# Patient Record
Sex: Male | Born: 1940
Health system: Southern US, Community
[De-identification: ages and names within clinical notes are randomized; demographics above are authoritative.]

## PROBLEM LIST (undated history)

## (undated) DIAGNOSIS — I2510 Atherosclerotic heart disease of native coronary artery without angina pectoris: Secondary | ICD-10-CM

## (undated) DIAGNOSIS — J449 Chronic obstructive pulmonary disease, unspecified: Secondary | ICD-10-CM

## (undated) DIAGNOSIS — F1020 Alcohol dependence, uncomplicated: Secondary | ICD-10-CM

## (undated) DIAGNOSIS — C801 Malignant (primary) neoplasm, unspecified: Secondary | ICD-10-CM

## (undated) DIAGNOSIS — IMO0001 Reserved for inherently not codable concepts without codable children: Secondary | ICD-10-CM

## (undated) DIAGNOSIS — R0600 Dyspnea, unspecified: Secondary | ICD-10-CM

## (undated) DIAGNOSIS — D329 Benign neoplasm of meninges, unspecified: Secondary | ICD-10-CM

## (undated) DIAGNOSIS — K219 Gastro-esophageal reflux disease without esophagitis: Secondary | ICD-10-CM

## (undated) DIAGNOSIS — R195 Other fecal abnormalities: Secondary | ICD-10-CM

## (undated) DIAGNOSIS — E785 Hyperlipidemia, unspecified: Secondary | ICD-10-CM

## (undated) DIAGNOSIS — E119 Type 2 diabetes mellitus without complications: Secondary | ICD-10-CM

## (undated) DIAGNOSIS — M1990 Unspecified osteoarthritis, unspecified site: Secondary | ICD-10-CM

## (undated) DIAGNOSIS — Z720 Tobacco use: Secondary | ICD-10-CM

## (undated) DIAGNOSIS — Z8719 Personal history of other diseases of the digestive system: Secondary | ICD-10-CM

## (undated) DIAGNOSIS — I739 Peripheral vascular disease, unspecified: Secondary | ICD-10-CM

## (undated) DIAGNOSIS — IMO0002 Reserved for concepts with insufficient information to code with codable children: Secondary | ICD-10-CM

## (undated) DIAGNOSIS — Z87442 Personal history of urinary calculi: Secondary | ICD-10-CM

## (undated) DIAGNOSIS — F419 Anxiety disorder, unspecified: Secondary | ICD-10-CM

## (undated) DIAGNOSIS — I499 Cardiac arrhythmia, unspecified: Secondary | ICD-10-CM

## (undated) DIAGNOSIS — J189 Pneumonia, unspecified organism: Secondary | ICD-10-CM

## (undated) HISTORY — DX: Other fecal abnormalities: R19.5

## (undated) HISTORY — PX: HERNIA REPAIR: SHX51

## (undated) HISTORY — DX: Tobacco use: Z72.0

## (undated) HISTORY — PX: TONSILLECTOMY: SUR1361

## (undated) HISTORY — DX: Reserved for inherently not codable concepts without codable children: IMO0001

## (undated) HISTORY — DX: Benign neoplasm of meninges, unspecified: D32.9

## (undated) HISTORY — PX: TONSILLECTOMY: SHX5217

## (undated) HISTORY — DX: Hyperlipidemia, unspecified: E78.5

## (undated) HISTORY — PX: EYE SURGERY: SHX253

## (undated) HISTORY — DX: Reserved for concepts with insufficient information to code with codable children: IMO0002

## (undated) HISTORY — DX: Gastro-esophageal reflux disease without esophagitis: K21.9

## (undated) HISTORY — PX: COLONOSCOPY: SHX174

## (undated) HISTORY — DX: Type 2 diabetes mellitus without complications: E11.9

## (undated) HISTORY — DX: Alcohol dependence, uncomplicated: F10.20

---

## 2008-07-25 ENCOUNTER — Ambulatory Visit: Payer: Self-pay | Admitting: *Deleted

## 2008-07-25 DIAGNOSIS — D369 Benign neoplasm, unspecified site: Secondary | ICD-10-CM | POA: Insufficient documentation

## 2008-07-26 DIAGNOSIS — K219 Gastro-esophageal reflux disease without esophagitis: Secondary | ICD-10-CM | POA: Insufficient documentation

## 2008-08-11 ENCOUNTER — Emergency Department (HOSPITAL_BASED_OUTPATIENT_CLINIC_OR_DEPARTMENT_OTHER): Admission: EM | Admit: 2008-08-11 | Discharge: 2008-08-11 | Payer: Self-pay | Admitting: Emergency Medicine

## 2008-08-11 ENCOUNTER — Ambulatory Visit: Payer: Self-pay | Admitting: Internal Medicine

## 2008-08-11 DIAGNOSIS — R3129 Other microscopic hematuria: Secondary | ICD-10-CM | POA: Insufficient documentation

## 2008-08-11 LAB — CONVERTED CEMR LAB
Bilirubin Urine: NEGATIVE
Glucose, Urine, Semiquant: NEGATIVE
Ketones, urine, test strip: NEGATIVE
Nitrite: NEGATIVE
Protein, U semiquant: NEGATIVE
Specific Gravity, Urine: <1.005
Urobilinogen, UA: 0.2
WBC Urine, dipstick: NEGATIVE
pH: 8.5

## 2008-08-17 ENCOUNTER — Ambulatory Visit: Payer: Self-pay | Admitting: *Deleted

## 2008-11-24 ENCOUNTER — Ambulatory Visit: Payer: Self-pay | Admitting: *Deleted

## 2008-11-24 DIAGNOSIS — F17210 Nicotine dependence, cigarettes, uncomplicated: Secondary | ICD-10-CM | POA: Insufficient documentation

## 2008-12-26 ENCOUNTER — Ambulatory Visit: Payer: Self-pay | Admitting: Diagnostic Radiology

## 2008-12-26 ENCOUNTER — Ambulatory Visit: Payer: Self-pay | Admitting: *Deleted

## 2008-12-26 ENCOUNTER — Ambulatory Visit (HOSPITAL_BASED_OUTPATIENT_CLINIC_OR_DEPARTMENT_OTHER): Admission: RE | Admit: 2008-12-26 | Discharge: 2008-12-26 | Payer: Self-pay | Admitting: *Deleted

## 2008-12-26 LAB — CONVERTED CEMR LAB
ALT: 16 units/L (ref 0–53)
AST: 16 units/L (ref 0–37)
Albumin: 3.6 g/dL (ref 3.5–5.2)
Alkaline Phosphatase: 76 units/L (ref 39–117)
BUN: 14 mg/dL (ref 6–23)
Basophils Absolute: 0 10*3/uL (ref 0.0–0.1)
Basophils Relative: 0 % (ref 0.0–3.0)
CO2: 31 meq/L (ref 19–32)
Calcium: 9.2 mg/dL (ref 8.4–10.5)
Chloride: 102 meq/L (ref 96–112)
Cholesterol: 223 mg/dL (ref 0–200)
Creatinine, Ser: 0.9 mg/dL (ref 0.4–1.5)
Direct LDL: 142.2 mg/dL
Eosinophils Absolute: 0.1 10*3/uL (ref 0.0–0.7)
Eosinophils Relative: 1.7 % (ref 0.0–5.0)
GFR calc Af Amer: 108 mL/min
GFR calc non Af Amer: 89 mL/min
Glucose, Bld: 95 mg/dL (ref 70–99)
HCT: 43.9 % (ref 39.0–52.0)
Hemoglobin: 15.4 g/dL (ref 13.0–17.0)
Lymphocytes Relative: 30.7 % (ref 12.0–46.0)
MCHC: 35 g/dL (ref 30.0–36.0)
MCV: 92.3 fL (ref 78.0–100.0)
Monocytes Absolute: 0.7 10*3/uL (ref 0.1–1.0)
Monocytes Relative: 7.7 % (ref 3.0–12.0)
Neutro Abs: 5.2 10*3/uL (ref 1.4–7.7)
Neutrophils Relative %: 59.9 % (ref 43.0–77.0)
PSA: 2.02 ng/mL (ref 0.10–4.00)
Platelets: 254 10*3/uL (ref 150–400)
Potassium: 4 meq/L (ref 3.5–5.1)
RBC: 4.76 M/uL (ref 4.22–5.81)
RDW: 13 % (ref 11.5–14.6)
Sodium: 141 meq/L (ref 135–145)
TSH: 1.55 microintl units/mL (ref 0.35–5.50)
Total Bilirubin: 0.7 mg/dL (ref 0.3–1.2)
Total Protein: 6.6 g/dL (ref 6.0–8.3)
WBC: 8.6 10*3/uL (ref 4.5–10.5)

## 2009-01-03 ENCOUNTER — Ambulatory Visit: Payer: Self-pay | Admitting: Gastroenterology

## 2009-01-23 ENCOUNTER — Ambulatory Visit: Payer: Self-pay | Admitting: Gastroenterology

## 2009-11-17 ENCOUNTER — Ambulatory Visit: Payer: Self-pay | Admitting: Family

## 2009-11-17 DIAGNOSIS — D179 Benign lipomatous neoplasm, unspecified: Secondary | ICD-10-CM | POA: Insufficient documentation

## 2009-11-17 LAB — CONVERTED CEMR LAB
ALT: 12 units/L (ref 0–53)
AST: 12 units/L (ref 0–37)
Albumin: 4.3 g/dL (ref 3.5–5.2)
Alkaline Phosphatase: 84 units/L (ref 39–117)
BUN: 12 mg/dL (ref 6–23)
Basophils Absolute: 0 10*3/uL (ref 0.0–0.1)
Basophils Relative: 0 % (ref 0–1)
Bilirubin Urine: NEGATIVE
CO2: 22 meq/L (ref 19–32)
Calcium: 10 mg/dL (ref 8.4–10.5)
Chloride: 102 meq/L (ref 96–112)
Creatinine, Ser: 0.76 mg/dL (ref 0.40–1.50)
Eosinophils Absolute: 0.2 10*3/uL (ref 0.0–0.7)
Eosinophils Relative: 2 % (ref 0–5)
Glucose, Bld: 86 mg/dL (ref 70–99)
Glucose, Urine, Semiquant: NEGATIVE
HCT: 47.7 % (ref 39.0–52.0)
Hemoglobin: 15.9 g/dL (ref 13.0–17.0)
Ketones, urine, test strip: NEGATIVE
Lymphocytes Relative: 32 % (ref 12–46)
Lymphs Abs: 3.7 10*3/uL (ref 0.7–4.0)
MCHC: 33.3 g/dL (ref 30.0–36.0)
MCV: 93 fL (ref 78.0–100.0)
Monocytes Absolute: 0.8 10*3/uL (ref 0.1–1.0)
Monocytes Relative: 7 % (ref 3–12)
Neutro Abs: 6.6 10*3/uL (ref 1.7–7.7)
Neutrophils Relative %: 59 % (ref 43–77)
Nitrite: NEGATIVE
PSA: 1.88 ng/mL (ref 0.10–4.00)
Platelets: 291 10*3/uL (ref 150–400)
Potassium: 4.6 meq/L (ref 3.5–5.3)
Protein, U semiquant: NEGATIVE
RBC: 5.13 M/uL (ref 4.22–5.81)
RDW: 13.4 % (ref 11.5–15.5)
Sodium: 140 meq/L (ref 135–145)
Specific Gravity, Urine: 1.01
Total Bilirubin: 0.5 mg/dL (ref 0.3–1.2)
Total Protein: 7 g/dL (ref 6.0–8.3)
Urobilinogen, UA: 0.2
WBC Urine, dipstick: NEGATIVE
WBC: 11.4 10*3/uL — ABNORMAL HIGH (ref 4.0–10.5)
pH: 6

## 2009-11-19 ENCOUNTER — Encounter: Payer: Self-pay | Admitting: Family

## 2009-11-19 ENCOUNTER — Telehealth (INDEPENDENT_AMBULATORY_CARE_PROVIDER_SITE_OTHER): Payer: Self-pay | Admitting: *Deleted

## 2009-11-20 ENCOUNTER — Encounter (INDEPENDENT_AMBULATORY_CARE_PROVIDER_SITE_OTHER): Payer: Self-pay | Admitting: *Deleted

## 2009-11-22 ENCOUNTER — Encounter (INDEPENDENT_AMBULATORY_CARE_PROVIDER_SITE_OTHER): Payer: Self-pay | Admitting: *Deleted

## 2009-11-24 ENCOUNTER — Ambulatory Visit: Payer: Self-pay | Admitting: Internal Medicine

## 2009-11-24 ENCOUNTER — Encounter: Payer: Self-pay | Admitting: Family

## 2009-11-24 ENCOUNTER — Ambulatory Visit: Payer: Self-pay | Admitting: Gastroenterology

## 2009-11-24 LAB — CONVERTED CEMR LAB
Cholesterol: 203 mg/dL — ABNORMAL HIGH (ref 0–200)
HDL: 45 mg/dL (ref >39)
LDL Cholesterol: 123 mg/dL — ABNORMAL HIGH (ref 0–99)
Total CHOL/HDL Ratio: 4.5
Triglycerides: 174 mg/dL — ABNORMAL HIGH (ref <150)
VLDL: 35 mg/dL (ref 0–40)

## 2009-11-26 ENCOUNTER — Encounter: Payer: Self-pay | Admitting: Family

## 2009-11-26 ENCOUNTER — Telehealth (INDEPENDENT_AMBULATORY_CARE_PROVIDER_SITE_OTHER): Payer: Self-pay | Admitting: *Deleted

## 2009-11-30 ENCOUNTER — Encounter: Payer: Self-pay | Admitting: Family

## 2009-12-08 ENCOUNTER — Ambulatory Visit: Payer: Self-pay | Admitting: Gastroenterology

## 2009-12-08 LAB — HM COLONOSCOPY

## 2009-12-12 ENCOUNTER — Encounter: Payer: Self-pay | Admitting: Gastroenterology

## 2009-12-12 ENCOUNTER — Telehealth (INDEPENDENT_AMBULATORY_CARE_PROVIDER_SITE_OTHER): Payer: Self-pay | Admitting: *Deleted

## 2009-12-18 ENCOUNTER — Encounter: Payer: Self-pay | Admitting: Family

## 2010-01-08 ENCOUNTER — Telehealth: Payer: Self-pay | Admitting: Family

## 2010-01-17 ENCOUNTER — Emergency Department (HOSPITAL_BASED_OUTPATIENT_CLINIC_OR_DEPARTMENT_OTHER): Admission: EM | Admit: 2010-01-17 | Discharge: 2010-01-17 | Payer: Self-pay | Admitting: Emergency Medicine

## 2010-01-17 ENCOUNTER — Ambulatory Visit: Payer: Self-pay | Admitting: Diagnostic Radiology

## 2010-01-17 ENCOUNTER — Encounter: Payer: Self-pay | Admitting: Family

## 2010-01-29 ENCOUNTER — Ambulatory Visit: Payer: Self-pay | Admitting: Family

## 2010-01-29 DIAGNOSIS — I714 Abdominal aortic aneurysm, without rupture, unspecified: Secondary | ICD-10-CM | POA: Insufficient documentation

## 2010-01-31 ENCOUNTER — Ambulatory Visit (HOSPITAL_BASED_OUTPATIENT_CLINIC_OR_DEPARTMENT_OTHER): Admission: RE | Admit: 2010-01-31 | Discharge: 2010-01-31 | Payer: Self-pay | Admitting: Internal Medicine

## 2010-01-31 ENCOUNTER — Ambulatory Visit: Payer: Self-pay | Admitting: Interventional Radiology

## 2010-02-01 ENCOUNTER — Telehealth: Payer: Self-pay | Admitting: Internal Medicine

## 2010-02-02 ENCOUNTER — Telehealth: Payer: Self-pay | Admitting: Family

## 2010-02-05 ENCOUNTER — Ambulatory Visit: Payer: Self-pay | Admitting: Family

## 2010-02-05 DIAGNOSIS — N281 Cyst of kidney, acquired: Secondary | ICD-10-CM | POA: Insufficient documentation

## 2010-02-05 DIAGNOSIS — I700 Atherosclerosis of aorta: Secondary | ICD-10-CM | POA: Insufficient documentation

## 2010-02-12 ENCOUNTER — Telehealth: Payer: Self-pay | Admitting: Family

## 2010-03-26 ENCOUNTER — Telehealth: Payer: Self-pay | Admitting: Family

## 2010-04-06 ENCOUNTER — Ambulatory Visit: Payer: Self-pay | Admitting: Family

## 2010-05-14 ENCOUNTER — Telehealth: Payer: Self-pay | Admitting: Family

## 2010-05-14 ENCOUNTER — Ambulatory Visit: Payer: Self-pay | Admitting: Family

## 2010-05-14 DIAGNOSIS — E785 Hyperlipidemia, unspecified: Secondary | ICD-10-CM | POA: Insufficient documentation

## 2010-05-14 DIAGNOSIS — D72829 Elevated white blood cell count, unspecified: Secondary | ICD-10-CM | POA: Insufficient documentation

## 2010-05-14 LAB — CONVERTED CEMR LAB
ALT: 9 units/L (ref 0–53)
AST: 10 units/L (ref 0–37)
Albumin: 4.3 g/dL (ref 3.5–5.2)
Alkaline Phosphatase: 79 units/L (ref 39–117)
Basophils Absolute: 0 10*3/uL (ref 0.0–0.1)
Basophils Relative: 0 % (ref 0–1)
Bilirubin Urine: NEGATIVE
Bilirubin, Direct: 0.1 mg/dL (ref 0.0–0.3)
Cholesterol: 209 mg/dL — ABNORMAL HIGH (ref 0–200)
Eosinophils Absolute: 0.1 10*3/uL (ref 0.0–0.7)
Eosinophils Relative: 1 % (ref 0–5)
Glucose, Urine, Semiquant: NEGATIVE
HCT: 48.1 % (ref 39.0–52.0)
HDL: 45 mg/dL (ref >39)
Hemoglobin: 15.6 g/dL (ref 13.0–17.0)
Indirect Bilirubin: 0.3 mg/dL (ref 0.0–0.9)
Ketones, urine, test strip: NEGATIVE
LDL Cholesterol: 110 mg/dL — ABNORMAL HIGH (ref 0–99)
Lymphocytes Relative: 29 % (ref 12–46)
Lymphs Abs: 2.9 10*3/uL (ref 0.7–4.0)
MCHC: 32.4 g/dL (ref 30.0–36.0)
MCV: 94.5 fL (ref 78.0–100.0)
Monocytes Absolute: 0.7 10*3/uL (ref 0.1–1.0)
Monocytes Relative: 7 % (ref 3–12)
Neutro Abs: 6.2 10*3/uL (ref 1.7–7.7)
Neutrophils Relative %: 62 % (ref 43–77)
Nitrite: NEGATIVE
Platelets: 251 10*3/uL (ref 150–400)
RBC: 5.09 M/uL (ref 4.22–5.81)
RDW: 14 % (ref 11.5–15.5)
Specific Gravity, Urine: 1.015
Total Bilirubin: 0.4 mg/dL (ref 0.3–1.2)
Total CHOL/HDL Ratio: 4.6
Total Protein: 6.9 g/dL (ref 6.0–8.3)
Triglycerides: 271 mg/dL — ABNORMAL HIGH (ref <150)
Urobilinogen, UA: 0.2
VLDL: 54 mg/dL — ABNORMAL HIGH (ref 0–40)
WBC Urine, dipstick: NEGATIVE
WBC: 10 10*3/uL (ref 4.0–10.5)
pH: 6

## 2010-05-15 ENCOUNTER — Telehealth: Payer: Self-pay | Admitting: Family

## 2010-05-18 ENCOUNTER — Telehealth: Payer: Self-pay | Admitting: Family

## 2010-05-21 ENCOUNTER — Telehealth: Payer: Self-pay | Admitting: Family

## 2010-05-21 ENCOUNTER — Ambulatory Visit: Payer: Self-pay | Admitting: Family

## 2010-05-21 DIAGNOSIS — H9190 Unspecified hearing loss, unspecified ear: Secondary | ICD-10-CM | POA: Insufficient documentation

## 2010-05-28 ENCOUNTER — Encounter: Payer: Self-pay | Admitting: Family

## 2010-06-05 ENCOUNTER — Telehealth: Payer: Self-pay | Admitting: Family

## 2010-06-06 ENCOUNTER — Telehealth: Payer: Self-pay | Admitting: Family

## 2010-06-07 ENCOUNTER — Encounter: Payer: Self-pay | Admitting: Family

## 2010-06-08 ENCOUNTER — Ambulatory Visit: Payer: Self-pay | Admitting: Family

## 2010-06-08 LAB — CONVERTED CEMR LAB
Bilirubin Urine: NEGATIVE
Glucose, Urine, Semiquant: NEGATIVE
Ketones, urine, test strip: NEGATIVE
Nitrite: NEGATIVE
Protein, U semiquant: NEGATIVE
Specific Gravity, Urine: 1.01
Urobilinogen, UA: 0.2
pH: 5

## 2010-06-20 ENCOUNTER — Telehealth: Payer: Self-pay | Admitting: Family

## 2010-07-04 ENCOUNTER — Telehealth (INDEPENDENT_AMBULATORY_CARE_PROVIDER_SITE_OTHER): Payer: Self-pay | Admitting: *Deleted

## 2010-07-12 ENCOUNTER — Telehealth: Payer: Self-pay | Admitting: Family

## 2010-07-30 ENCOUNTER — Ambulatory Visit: Payer: Self-pay | Admitting: Family

## 2010-07-30 LAB — CONVERTED CEMR LAB
Cholesterol: 150 mg/dL (ref 0–200)
HDL: 46 mg/dL (ref >39)
LDL Cholesterol: 78 mg/dL (ref 0–99)
Total CHOL/HDL Ratio: 3.3
Triglycerides: 128 mg/dL (ref <150)
VLDL: 26 mg/dL (ref 0–40)

## 2010-10-05 ENCOUNTER — Telehealth: Payer: Self-pay | Admitting: Family

## 2010-10-09 ENCOUNTER — Telehealth: Payer: Self-pay | Admitting: Family

## 2010-10-09 ENCOUNTER — Ambulatory Visit (HOSPITAL_BASED_OUTPATIENT_CLINIC_OR_DEPARTMENT_OTHER)
Admission: RE | Admit: 2010-10-09 | Discharge: 2010-10-09 | Payer: Self-pay | Source: Home / Self Care | Admitting: Internal Medicine

## 2010-10-09 ENCOUNTER — Ambulatory Visit: Payer: Self-pay | Admitting: Family

## 2010-10-09 DIAGNOSIS — R609 Edema, unspecified: Secondary | ICD-10-CM | POA: Insufficient documentation

## 2010-10-09 DIAGNOSIS — M5430 Sciatica, unspecified side: Secondary | ICD-10-CM | POA: Insufficient documentation

## 2010-10-09 DIAGNOSIS — L851 Acquired keratosis [keratoderma] palmaris et plantaris: Secondary | ICD-10-CM | POA: Insufficient documentation

## 2010-10-10 ENCOUNTER — Telehealth: Payer: Self-pay | Admitting: Family

## 2010-10-31 ENCOUNTER — Ambulatory Visit
Admission: RE | Admit: 2010-10-31 | Discharge: 2010-10-31 | Payer: Self-pay | Source: Home / Self Care | Attending: Family | Admitting: Family

## 2010-12-04 NOTE — Progress Notes (Signed)
Summary: lab result, tricor  Phone Note Call from Patient Call back at Home Phone [phone]   Reason for Call: Talk to Nurse Summary of Call: Pt called to get lab results, pls call Initial call taken by: Lannette Donath,  May 18, 2010 4:57 PM  Follow-up for Phone Call        I would like to add another med for his triglycerides called tricor. Pt should f/u in 2 months. Follow-up by: Lemont Fillers FNP,  May 21, 2010 10:27 AM  Additional Follow-up for Phone Call Additional follow up Details #1::        Lab letter given to pt. Pt notified that Tricor has been sent to his pharmacy.  Nicki Guadalajara Fergerson CMA (AAMA)  May 21, 2010 3:04 PM     New/Updated Medications: TRICOR 145 MG TABS (FENOFIBRATE) one tablet by mouth daily Prescriptions: TRICOR 145 MG TABS (FENOFIBRATE) one tablet by mouth daily  #30 x 1   Entered and Authorized by:   Lemont Fillers FNP   Signed by:   Lemont Fillers FNP on 05/21/2010   Method used:   Electronically to        [facility] [phone]* (retail)       [address]       [place], [place]  [address]       Ph: [phone]       Fax: [phone]   RxID:   [id number]

## 2010-12-04 NOTE — Progress Notes (Signed)
Summary: simvastatin refill  Phone Note Call from Patient Call back at Home Phone [phone]   Caller: Patient Call For: Robert Bates Reason for Call: Refill Medication Summary of Call: Pt needs refill for cholestrol meds, pls call him first though, he has a question Initial call taken by: Lannette Donath,  Mar 26, 2010 9:52 AM  Follow-up for Phone Call        Spoke to pt. he requests refill on Simvastatin. Advised pt. I could give him a 30 day supply and he will need to be seen before further refills will be given.  Pt voices understanding and states that he will call back to schedule appt.  Robert Bates CMA  Mar 26, 2010 11:51 AM     Prescriptions: SIMVASTATIN 40 MG TABS (SIMVASTATIN) one tablet by mouth daily  #30 x 0   Entered by:   Robert Bates CMA   Authorized by:   Robert Bates   Signed by:   Robert Bates CMA on 03/26/2010   Method used:   Electronically to        [facility] [phone]* (retail)       [address]       [place], [place]  [address]       Ph: [phone]       Fax: [phone]   RxID:   [id number]

## 2010-12-04 NOTE — Progress Notes (Signed)
  Phone Note Outgoing Call   Summary of Call: Could we please make sure that [name] is scheduled for a FLP 1 week before a 3 month follow up?  Thanks Initial call taken by: Lemont Fillers FNP,  November 26, 2009 10:21 PM  Follow-up for Phone Call        pt has lab in [facility] 12/04/09 and appt  02/16/10 with you .Kandice Hams  November 27, 2009 8:34 AM  Follow-up by: Kandice Hams,  November 27, 2009 8:34 AM

## 2010-12-04 NOTE — Progress Notes (Signed)
  Phone Note Outgoing Call   Call placed by: Lemont Fillers FNP,  October 09, 2010 5:00 PM Call placed to: Imaging Summary of Call: Spoke with Eber Jones in imaging- pt did not complete doppler.  Myriam Jacobson will call patient this evening to see if he can complete this evening.  Initial call taken by: Lemont Fillers FNP,  October 09, 2010 5:00 PM

## 2010-12-04 NOTE — Progress Notes (Signed)
Summary: Medication Change  Phone Note Outgoing Call   Summary of Call: Please call patient and let him know that instead of the fenofibrate, I would like for him to try fish oil to help with his cholesterol as below.  Initial call taken by: Lemont Fillers FNP,  June 20, 2010 9:07 AM  Follow-up for Phone Call        patient advised  per Sandford Craze instructions Follow-up by: Glendell Docker CMA,  June 20, 2010 4:46 PM    New/Updated Medications: FISH OIL CONCENTRATE 1000 MG CAPS (OMEGA-3 FATTY ACIDS) two caps by mouth two times a day

## 2010-12-04 NOTE — Letter (Signed)
   [place] [facility] [address] [place], [place]  [address]  [place] Phone: [phone]      November 19, 2009   [facility] [address] [place], [place] [address]  RE:  LAB RESULTS  Dear  Robert Bates,  The following is an interpretation of your most recent lab tests.  Please take note of any instructions provided or changes to medications that have resulted from your lab work.  PSA:  normal - no follow-up needed PSA: 1.88  ELECTROLYTES:  Good - no changes needed  KIDNEY FUNCTION TESTS:  Good - no changes needed  LIVER FUNCTION TESTS:  Good - no changes needed    CBC:  Fair - review at your next visit  Please call my office to make arrangements for further tests or an appointment.  Sincerely Yours,    Lemont Fillers FNP

## 2010-12-04 NOTE — Assessment & Plan Note (Signed)
Summary: 2 month fu/dt--Rm 4   Vital Signs:  Patient profile:   70 year old male Height:      69 inches Weight:      177 pounds BMI:     26.23 Temp:     97.6 degrees F oral Pulse rate:   66 / minute Resp:     16 per minute BP sitting:   124 / 84  (right arm) Cuff size:   regular  Vitals Entered By: Mervin Kung CMA Duncan Dull) (July 30, 2010 11:09 AM) CC: Rm 5  2 month f/u. Is Patient Diabetic? No Pain Assessment Patient in pain? no        Primary Care Provider:  Lemont Fillers FNP  CC:  Rm 5  2 month f/u.[name]  History of Present Illness: Robert Bates is a 70 year old male who presents today for follow up.     GERD- has been well controlled on PPI unless he forgets to take his medication.  Hyperlipidemia- tolerating statin without problems. He reports that he has been having trouble remembering to take his fish oil twice daily. He has been trying to work on eating healthier.  He continues to take aspirin on a daily basis.    Allergies: 1)  ! Iodine  Past History:  Past Medical History: Last updated: 12/26/2008 GERD Alcoholism - past problem per patient - but admits to still drinking - unclear how much tobacco abuse hematuria - refuses work up or referral - understands risks of morbidity/mortality - 11/2008, 12/2008    Past Surgical History: Last updated: 08/11/2008 Tonsillectomy   Review of Systems       denies shortness of breath.  Occasional GERD symptoms if he misses his "pill"  Physical Exam  General:  Well-developed,well-nourished,in no acute distress; alert,appropriate and cooperative throughout examination Lungs:  Normal respiratory effort, chest expands symmetrically. Lungs are clear to auscultation, no crackles or wheezes. Heart:  Normal rate and regular rhythm. S1 and S2 normal without gallop, murmur, click, rub or other extra sounds. Extremities:  No clubbing, cyanosis, edema, or deformity noted with normal full range of motion of all  joints.     Impression & Recommendations:  Problem # 1:  HYPERLIPIDEMIA (ICD-272.4) Assessment Unchanged Will check FLP today, continue statin, pt reminded to take fish oil two times a day. Patient was counseled on low cholesterol diet, exercise, and weight loss.  He was also counseled on need to quit smoking. His updated medication list for this problem includes:    Simvastatin 40 Mg Tabs (Simvastatin) ..... One tablet by mouth daily  Orders: TLB-Lipid Panel (80061-LIPID)  Problem # 2:  GERD (ICD-530.81) Assessment: Improved Continue PPI His updated medication list for this problem includes:    Omeprazole 40 Mg Cpdr (Omeprazole) ..... One cap by mouth once daily  Complete Medication List: 1)  Omeprazole 40 Mg Cpdr (Omeprazole) .... One cap by mouth once daily 2)  Aspirin Ec 325 Mg Tbec (Aspirin) .... One by mouth once daily 3)  Simvastatin 40 Mg Tabs (Simvastatin) .... One tablet by mouth daily 4)  Fish Oil Concentrate 1000 Mg Caps (Omega-3 fatty acids) .... Two caps by mouth two times a day  Other Orders: Tobacco use cessation intermediate 3-10 minutes ([address])  Patient Instructions: 1)  Please complete your labs downstairs. 2)  Follow up in 3 months, sooner if problems or concerns.  Current Allergies (reviewed today): ! IODINE

## 2010-12-04 NOTE — Assessment & Plan Note (Signed)
Summary: er follow up/mhf   Vital Signs:  Patient profile:   70 year old male Height:      69 inches Weight:      166.75 pounds BMI:     24.71 Temp:     97.4 degrees F oral Pulse rate:   76 / minute Pulse rhythm:   regular Resp:     16 per minute BP sitting:   102 / 80  (right arm) Cuff size:   regular  Vitals Entered By: Mervin Kung CMA (January 29, 2010 2:41 PM) CC: room  4   Follow up from ER on 01/17/10   Primary Care Provider:  Paulo Fruit MD  CC:  room  4   Follow up from ER on 01/17/10.  History of Present Illness: Robert Bates is a 70 year old male who presents today following a trip to the ER on 3/16 with bilateral lower abdominal pain.  A non-contrasted CT scan was performed (due to pt's iodine allergy)which noted some  soft tissue stranding arount the entire abdominal aora.  In addition it was noted that he ahad an AAA of 3.1 x 3.6 cm   Non-obstructive reanl calculi were noted.  Also notd was a 2.4 cm hyperdense lesion of the upper pole of the right kidney.  It was recommended by the radiology that this patient be pre-medicated and undergo a follow up ct with contrast.   Denies current abdominal pain.  Notes that he has been having chills but denies fever, cough or current nasal congestion. Notes + right ear discomfort with ringing in his ear.    Allergies: 1)  ! Iodine  Physical Exam  General:  Well-developed,well-nourished,in no acute distress; alert,appropriate and cooperative throughout examination Ears:  bilateral TM's occluded with cerumen, attempted disimpaction, but unable to complete due to discomfort. (right ear) Lungs:  Normal respiratory effort, chest expands symmetrically. Lungs are clear to auscultation, no crackles or wheezes. Heart:  Normal rate and regular rhythm. S1 and S2 normal without gallop, murmur, click, rub or other extra sounds.   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   Orders: Misc. Referral (Misc. Ref)  Problem # 2:  OTITIS MEDIA (ICD-382.9) Assessment: New Will plan empiric treatment with amoxicillin His updated medication list for this problem includes:    Amoxicillin 500 Mg Cap (Amoxicillin) .[name]... Take 1 capsule by mouth three times a day x 10 days  Complete Medication List: 1)  Omeprazole 40 Mg Cpdr (Omeprazole) .... One cap by mouth once daily 2)  Oxycodone-acetaminophen 5-325 Mg Tabs (Oxycodone-acetaminophen) .... Take 1 tablet every 4-6 hours as needed for pain 3)  Prednisone 50 Mg Tabs (Prednisone) .... Take as directed 4)  Amoxicillin 500 Mg Cap (Amoxicillin) .... Take 1 capsule by mouth three times a day x 10 days  Patient Instructions: 1)  Take Prednisone 50mg  (one tablet by mouth) 13 hours prior to your CT scan, 7 hours prior to your CT scan, and again 1 hour prior to your CT scan.  Also take Benadryl 50mg  (2 tablets) 1 hour prior to your CT scan. 2)  You will be called about your referral for your CT scan.   3)  Go to ER if you develop abdominal pain,  or chest pain. 4)  Call if your develop fever  over 101. 5)  Follow up in 1 week Prescriptions: AMOXICILLIN 500 MG CAP (AMOXICILLIN) Take 1 capsule by mouth three times a day X 10 days  #30 x 0   Entered and Authorized by:   Lemont Fillers FNP   Signed by:   Lemont Fillers FNP on 01/29/2010   Method used:   Electronically to        [facility] [phone]* (retail)       [address]       [place], [place]  [address]       Ph: [phone]       Fax: [phone]   RxID:   [id number] PREDNISONE 50 MG TABS (PREDNISONE) take as directed  #3 x 0   Entered and Authorized by:   Lemont Fillers FNP   Signed by:   Lemont Fillers FNP on 01/29/2010   Method used:   Electronically to         [facility] [phone]* (retail)       [address]       [place], [place]  [address]       Ph: [phone]       Fax: [phone]   RxID:   [id number]   Current Allergies (reviewed today): ! IODINE  Prevention & Chronic Care Immunizations   Influenza vaccine: Fluvax 3+  (08/17/2008)    Tetanus booster: 07/25/2008: Tdap    Pneumococcal vaccine: Not documented    H. zoster vaccine: Not documented  Colorectal Screening   Hemoccult: Not documented    Colonoscopy: DONE  (12/08/2009)   Colonoscopy due: 12/2012  Other Screening   PSA: 1.88  (11/17/2009)   Smoking status: current  (08/11/2008)   Smoking cessation counseling: yes  (11/24/2008)  Lipids   Total Cholesterol: 203  (11/24/2009)   LDL: 123  (11/24/2009)   LDL Direct: 142.2  (12/26/2008)   HDL: 45  (11/24/2009)   Triglycerides: 174  (11/24/2009)

## 2010-12-04 NOTE — Therapy (Signed)
Summary: [facility]   Imported By: Lanelle Bal 06/15/2010 12:34:34  _____________________________________________________________________  External Attachment:    Type:   Image     Comment:   External Document

## 2010-12-04 NOTE — Progress Notes (Signed)
Summary: simvastatin refill  Phone Note Call from Patient Call back at Home Phone [phone]   Caller: Patient Call For: Lemont Fillers FNP Reason for Call: Refill Medication Summary of Call: Pt was checking to see if we were calling in any Rx, I advised pt to call [facility] sometime before 4, if no Rx are complete to [facility] Initial call taken by: Lannette Donath,  May 14, 2010 2:20 PM  Follow-up for Phone Call        Pt notified refill complete.  Nicki Guadalajara Fergerson CMA (AAMA)  May 14, 2010 2:25 PM     Prescriptions: SIMVASTATIN 40 MG TABS (SIMVASTATIN) one tablet by mouth daily  #30 x 3   Entered by:   Mervin Kung CMA (AAMA)   Authorized by:   Lemont Fillers FNP   Signed by:   Mervin Kung CMA (AAMA) on 05/14/2010   Method used:   Electronically to        [facility] [phone]* (retail)       [address]       [place], [place]  [address]       Ph: [phone]       Fax: [phone]   RxID:   [id number]

## 2010-12-04 NOTE — Consult Note (Signed)
Summary: [facility]  [facility]   Imported By: Lanelle Bal 12/12/2009 10:41:24  _____________________________________________________________________  External Attachment:    Type:   Image     Comment:   External Document

## 2010-12-04 NOTE — Progress Notes (Signed)
Summary: labs  Phone Note Outgoing Call   Summary of Call: Please arrange for Mr Patriarca to have a repeat CBC in two weeks.  Thanks Initial call taken by: Lemont Fillers FNP,  November 19, 2009 2:36 PM  Follow-up for Phone Call        spoke w/ patient appt scheduled to recheck labs........Marland KitchenDoristine Devoid  November 20, 2009 3:50 PM

## 2010-12-04 NOTE — Letter (Signed)
Summary: [facility]  [facility]   Imported By: Lanelle Bal 12/28/2009 10:38:16  _____________________________________________________________________  External Attachment:    Type:   Image     Comment:   External Document

## 2010-12-04 NOTE — Letter (Signed)
Summary: Corrected [employer] Form  [employer]   Imported By: Lanelle Bal 07/31/2010 10:25:26  _____________________________________________________________________  External Attachment:    Type:   Image     Comment:   External Document

## 2010-12-04 NOTE — Assessment & Plan Note (Signed)
Summary: HAS CYST LEFT SHOULDER-CH   Vital Signs:  Patient Profile:   70 Years Old Male Height:     69 inches Weight:      176 pounds BMI:     26.08 O2 treatment:    Room Air Temp:     98.1 degrees F oral Pulse rate:   69 / minute Pulse rhythm:   regular Resp:     18 per minute BP sitting:   122 / 90  (right arm) Cuff size:   regular  Vitals Entered By: Darra Lis RMA (July 25, 2008 3:10 PM)             Is Patient Diabetic? No     Visit Type:  acute visit. PCP:  Paulo Fruit MD  Chief Complaint:  Establish care with a new doctor.[name]  History of Present Illness: Patient has a cyst left shoulder. Patient says that his sister-in-law lanced his cyst yesterday with a razor blade.  Patient had gone to another medical office about the cyst and was referred to a surgeon to remove it.  He did not want to go to a surgeon, so his sister-in-law lanced it and expressed out all the "white cheesy" material.  No purulent drainage per patient.  He is here just to make sure nothing else needs to be done.  He has an appt with the surgeon on 08/03/08.  Patient notes that the razor blade used for the "surgery" was perviously used to cut electrical wires and do various "fix it" activities - it was not a new blade.  He states that he has not had a tetanus shot in "way too long to remember."  Advised patient that he should get a tetatus shot.  Patient also had recent dental surgery - several teeth extracted - and is on antibiotics for that reason.  He was placed on penicillin last Monday for the dental surgery.  He says he is also on ampicillin from the initial medical office that referred him to a surgeon to excise what sounds like a dermoid cyst.  He says he takes one of those a day in place of a penicillin - "So I can get both of them in."    No fever / chills, no purulent discharge, no significant pain in the area.  Patient appears intoxicated and there is faint smell of alcohol.  When  asked how much alcohol he has had to drink today, he states that he has not drank any alcohol today.  He states that the smell is from using rubbing alcohol on his hands and "I even put some on my face."  He states, "I am talking funny because I had the teeth taken out."  He laughs about the inquiry and ends the conversation by stating, "I used to have a problem with alcohol, but not any more."  It is very difficult to get a full history from the patient.  He says, "I don't really know much about that" or he side tracks the conversation when I ask about past medical history, family history, etc.  I get very little details from the patient.    Updated Prior Medication List: PENICILLIN V POTASSIUM 500 MG TABS (PENICILLIN V POTASSIUM)  AMPICILLIN 500 MG CAPS (AMPICILLIN)   Current Allergies (reviewed today): ! IODINE  Past Medical History:    GERD - past problem    Alcoholism - past problem per patient  Past Surgical History:    Tonsillectomy   Family History:  Leukemia    Family History of Stroke   Social History:    Patient is very evasive about social history    Married   Risk Factors:  Tobacco use:  current    Year started:  1959    Cigarettes:  Yes -- 1 pack(s) per day    Counseled to quit/cut down tobacco use:  yes Passive smoke exposure:  yes Drug use:  no HIV high-risk behavior:  no Caffeine use:  5+ drinks per day Alcohol use:  yes    Type:  beer, wine - unclear how much he drinks    Counseled to quit/cut down alcohol use:  yes Exercise:  no Seatbelt use:  100 % Sun Exposure:  frequently  Family History Risk Factors:    Family History of MI in females < 5 years old:  no    Family History of MI in males < 68 years old:  no   Review of Systems       No nausea / vomiting / diarrhea / fever / chills / chest pain / SOB  - unable to get a full ROS - patient not able to stay on task   Physical Exam  General:     Well-developed, well nourished, well  hydrated, in no acute distress; see HPI Head:     Normocephalic and atraumatic without obvious abnormalities.  Eyes:     No corneal or conjunctival inflammation.  Vision grossly normal. Ears:     External ear shows no significant lesions or deformities.  Hearing is grossly normal. Nose:     External nasal examination shows no deformity or inflammation.  Neck:     supple, full ROM   Chest Wall:     No deformities, masses, or tenderness noted. Lungs:     Normal respiratory effort, chest expands symmetrically with good air flow noted. Lungs clear to auscultation with no crackles, rales or wheezes.   Heart:     Normal rate and  rhythm. S1 and S2 normal, without gallop, murmur, click, rub  Abdomen:     Bowel sounds positive, abdomen soft and non-tender without masses, organomegaly or hernias noted. no distention  Msk:     No gross deformity noted of cervical, thoracic or lumbar spine. normal ROM. no muscle atrophy noted.   Extremities:     No clubbing, cyanosis, edema, or deformity noted, grossly normal full range of motion with all joints.   Neurologic:     alert & oriented X3,  gait normal.   neuro is grossly intact Skin:     without suspicious lesions or rashes.  the area of the left upper chest near the clavicle shows an incision about 2 cm long with edges well approximated.  there is minimal surrounding erythema, no discharge present and no warmth. Psych:     Cognition and judgment appear intact. Alert and oriented x 3.  No apparent delusions, illusions, hallucinations, not anxious or depressed appearing.  Patient is somewhat evasive and distracted easily during the visit, but it appears to be his discomfort with being in a medical office more than anything else.      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  I have explained to him that the cyst will most  likely return and that when it does, the surgeon should excise it.  So, it would be best to  follow through with the appointment.  Also, to be sure there is no complication with the "home surgery" that was performed.  I reviewed with the patient the signs / symptoms of concern that would require that he seek immediate medical attention.  Would not disturb the incision at this point or attempt any invasive intervention.  Reviewed care of the incision.  Patient has one day left of penicillin (total of 7 days).  I have asked him to finish that for the sake of his recent dental extractions.  Then he is to complete the ampicillin as directed by previous medical office.  Return / call for any concerns.  Complete Medication List: 1)  Penicillin V Potassium 500 Mg Tabs (Penicillin v potassium) 2)  Ampicillin 500 Mg Caps (Ampicillin)  Other Orders: Tdap => 104yrs IM (32951) Admin 1st Vaccine (88416)   Patient Instructions: 1)  Please schedule a follow-up appointment in 4 months.   ]  Tetanus/Td Vaccine    Vaccine Type: Tdap    Site: right deltoid    Mfr: Sanofi Pasteur    Dose: 0.5 ml    Route: IM    Given by: Darra Lis RMA    Exp. Date: 12/31/2009    Lot #: [id number]

## 2010-12-04 NOTE — Assessment & Plan Note (Signed)
Summary: 1 week follow up/mhf   Vital Signs:  Patient profile:   70 year old male Height:      69 inches Weight:      169 pounds BMI:     25.05 Temp:     97.8 degrees F oral Pulse rate:   90 / minute Pulse rhythm:   regular Resp:     16 per minute BP sitting:   128 / 80  (right arm) Cuff size:   regular  Vitals Entered By: Mervin Kung CMA (February 05, 2010 10:29 AM) CC: room 5  1 week follow up.   Primary Care Provider:  Paulo Fruit MD  CC:  room 5  1 week follow up.[name]  History of Present Illness: Robert Bates is a 70 year old male who presents for follow up of his CT scan results. He brings his wife with him today.  Previously he underwent a  non-contrasted CT in the ED which noted soft tissue stranding arount the entire abdominal aorta as well as an AAA of 3.1 x 3.6 cm   Non-obstructive renal calculi were noted as well as 2.4 cm hyperdense lesion of the upper pole of the right kidney. The patient has  a history of iodine allergy and was premedicated for his contrasted CT scan.  Contrasted CT results noted abdominal aorta is barely dilated measuring 3.1 cm in greatest AP diameter.  Atherosclerotic disease is present on contrasted CT as well as small component of mural thrombus.     Microscopic hematuria- Denies dysuria or hematuria, or fever.  Has been following with urology who has recommended cystoscopy, however patient has not scheduled due to anxiety about the procedure.    Preventive Screening-Counseling & Management  Alcohol-Tobacco     Smoking Cessation Counseling: yes  Allergies: 1)  ! Iodine  Physical Exam  General:  Well-developed,well-nourished,in no acute distress; alert,appropriate and cooperative throughout examination Lungs:  Normal respiratory effort, chest expands symmetrically. Lungs are clear to auscultation, no crackles or wheezes. Heart:  Normal rate and regular rhythm. S1 and S2 normal without gallop, murmur, click, rub or other extra sounds. Psych:   Cognition and judgment appear intact. Alert and cooperative with normal attention span and concentration. 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  Results were reviewed by Dr. Artist Pais with Dr. Tonny Bollman.  See phone note.  Patient is now on Aspirin and a statin.  He and his wife tell me that they find Lipitor to be too expensive and wish to switch to something cheaper.  Will plan to treat with simvastatin instead as this is on the $4 plan at [facility]. (Pt was contacted about this change and notified by phone) Patient was urged to stop smoking as well.    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    Complete Medication List: 1)  Omeprazole 40 Mg Cpdr (Omeprazole) .... One cap by mouth once daily 2)  Prednisone 50 Mg Tabs (Prednisone) .... Take as directed 3)  Ciprofloxacin Hcl 500 Mg Tabs (Ciprofloxacin hcl) .... One by mouth two times a day 4)  Aspirin Ec 325 Mg Tbec (Aspirin) .... One by mouth once daily 5)  Simvastatin 40 Mg Tabs (Simvastatin) .... One tablet by mouth daily  Patient Instructions: 1)  Tobacco is very bad for your health and your  loved ones! You Should stop smoking!. 2)  Stop Smoking Tips: Choose a Quit date. Cut down before the Quit date. decide what you will do as a substitute when you feel the urge to smoke(gum,toothpick,exercise). 3)  Please follow up with urology for your cystoscopy. 4)  Follow up in 1 month. Prescriptions: SIMVASTATIN 40 MG TABS (SIMVASTATIN) one tablet by mouth daily  #30 x 0   Entered and Authorized by:   Robert Fillers FNP   Signed by:   Robert Fillers FNP on 02/05/2010   Method used:   Electronically to        [facility] [phone]* (retail)       [address]       [place], [place]  [address]       Ph: [phone]       Fax:  [phone]   RxID:   [id number]   Current Allergies (reviewed today): ! IODINE

## 2010-12-04 NOTE — Progress Notes (Signed)
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  We can fax the DOT eye exam to the optometrist if he lets [place] know who he is going to see.  Could you also pls ask patient if he completed the cystoscopy with Dr Brunilda Payor? Thanks Initial call taken by: Robert Fillers FNP,  May 15, 2010 4:46 PM  Follow-up for Phone Call        Attempted to contact pt at home number.  Unable to leave message as "voice mailbox has not been set up yet".  Robert Bates CMA Duncan Dull)  May 16, 2010 10:08 AM  Pt called back, he says he will keep his phone with him so you will not get vm, pls call him Robert Bates  May 16, 2010 12:52 PM  Additional Follow-up for Phone Call Additional follow up Details #1::        Pt states he does not have an optometrist and will need referral.  Also, he has not had the cystoscopy. I verified this with [facility].  Pt states that the doctor told him he was fine.  Robert Bates CMA Duncan Dull)  May 16, 2010 4:51 PM

## 2010-12-04 NOTE — Letter (Signed)
   [place] at [facility] [address] [place], [place]  [address]  [place] Phone: [phone]      November 26, 2009   [facility] [place] [address] [place], [place] [address]  RE:  LAB RESULTS  Dear  Mr. Tanabe,  The following is an interpretation of your most recent lab tests.  Please take note of any instructions provided or changes to medications that have resulted from your lab work.  LIPID PANEL:  Fair - review at your next visit Triglyceride: 174   Cholesterol: 203   LDL: 123   HDL: 45   Chol/HDL%:  4.5 Ratio  Your triglycerides and your cholesterol  are high.   Please make the following nutritional changes: 1.  Avoid white bread, white pasta and white rice 2.  Avoid high fructose corn syrup 3.  Instead eat brown carbs- Yams, Wheat pasta, whole grained breads, wild rice. 4. Avoid animal fats, butter, red meat,  whole fat dairy products. Limit your sugars to less than 40 grams a day from labeled foods and drinks. Return in 3 months for a follow up fasting lipid profile    Sincerely Yours,    Lemont Fillers FNP

## 2010-12-04 NOTE — Letter (Signed)
Summary: Results Letter  [facility]  [address] [place], [place] [address]   Phone: [phone]  Fax: [phone]        December 12, 2009 MRN: [id number]    [facility] [address] [place], [place]  [address]    Dear [name],   The polyp(s) removed during your recent procedure were proven to be adenomatous.  These are pre-cancerous polyps that may have grown into cancers if they had not been removed.  Based on current nationally recognized surveillance guidelines, I recommend that you have a repeat colonoscopy in 3 years.   We will therefore put your information in our reminder system and will contact you in 3 years to schedule a repeat procedure.  Please call if you have any questions or concerns.       Sincerely,  Rachael Fee MD  This letter has been electronically signed by your physician.  Appended Document: Results Letter Letter mailed 2.10.11  Appended Document: Results Letter December 12, 2009 MRN: [id number]    Robert Bates [address] [place], [place]  [address]    Dear [name],   The polyp(s) removed during your recent procedure were proven to be adenomatous.  These are pre-cancerous polyps that may have grown into cancers if they had not been removed.  Based on current nationally recognized surveillance guidelines, I recommend that you have a repeat colonoscopy in 3 years.   We will therefore put your information in our reminder system and will contact you in 3 years to schedule a repeat procedure.  Please call if you have any questions or concerns.       Sincerely,  Rachael Fee MD  This letter has been electronically signed by your physician.  LETTER MAILED TO P.O BOX.

## 2010-12-04 NOTE — Letter (Signed)
Summary: [facility] Instructions  [facility]  [address] [place], [place] [address]   Phone: [phone]  Fax: [phone]       Robert Bates    1941-06-16    MRN: [id number]        Procedure Day /Date:Friday 12/08/2009     Arrival Time:  10:00 am      Procedure Time:  11:00 am     Location of Procedure:                    _x _  [facility] (4th Floor)                        PREPARATION FOR COLONOSCOPY WITH MOVIPREP   Starting 5 days prior to your procedure Sunday 1/30  do not eat nuts, seeds, popcorn, corn, beans, peas,  salads, or any raw vegetables.  Do not take any fiber supplements (e.g. Metamucil, Citrucel, and Benefiber).  THE DAY BEFORE YOUR PROCEDURE         DATE: Thursday 2/3  1.  Drink clear liquids the entire day-NO SOLID FOOD  2.  Do not drink anything colored red or purple.  Avoid juices with pulp.  No orange juice.  3.  Drink at least 64 oz. (8 glasses) of fluid/clear liquids during the day to prevent dehydration and help the prep work efficiently.  CLEAR LIQUIDS INCLUDE: Water Jello Ice Popsicles Tea (sugar ok, no milk/cream) Powdered fruit flavored drinks Coffee (sugar ok, no milk/cream) Gatorade Juice: apple, white grape, white cranberry  Lemonade Clear bullion, consomm, broth Carbonated beverages (any kind) Strained chicken noodle soup Hard Candy                             4.  In the morning, mix first dose of MoviPrep solution:    Empty 1 Pouch A and 1 Pouch B into the disposable container    Add lukewarm drinking water to the top line of the container. Mix to dissolve    Refrigerate (mixed solution should be used within 24 hrs)  5.  Begin drinking the prep at 5:00 p.m. The MoviPrep container is divided by 4 marks.   Every 15 minutes drink the solution down to the next mark (approximately 8 oz) until the full liter is complete.   6.  Follow completed prep with 16 oz of clear liquid of your choice (Nothing  red or purple).  Continue to drink clear liquids until bedtime.  7.  Before going to bed, mix second dose of MoviPrep solution:    Empty 1 Pouch A and 1 Pouch B into the disposable container    Add lukewarm drinking water to the top line of the container. Mix to dissolve    Refrigerate  THE DAY OF YOUR PROCEDURE      DATE: Friday 2/4  Beginning at 6:00 a.m. (5 hours before procedure):         1. Every 15 minutes, drink the solution down to the next mark (approx 8 oz) until the full liter is complete.  2. Follow completed prep with 16 oz. of clear liquid of your choice.    3. You may drink clear liquids until 9:00 am  (2 HOURS BEFORE PROCEDURE).   MEDICATION INSTRUCTIONS  Unless otherwise instructed, you should take regular prescription medications with a small sip of water   as early as possible the  morning of your procedure.           OTHER INSTRUCTIONS  You will need a responsible adult at least 70 years of age to accompany you and drive you home.   This person must remain in the waiting room during your procedure.  Wear loose fitting clothing that is easily removed.  Leave jewelry and other valuables at home.  However, you may wish to bring a book to read or  an iPod/MP3 player to listen to music as you wait for your procedure to start.  Remove all body piercing jewelry and leave at home.  Total time from sign-in until discharge is approximately 2-3 hours.  You should go home directly after your procedure and rest.  You can resume normal activities the  day after your procedure.  The day of your procedure you should not:   Drive   Make legal decisions   Operate machinery   Drink alcohol   Return to work  You will receive specific instructions about eating, activities and medications before you leave.    The above instructions have been reviewed and explained to me by   Ezra Sites RN  November 24, 2009 3:18 PM     I fully understand and can  verbalize these instructions _____________________________ Date _________

## 2010-12-04 NOTE — Procedures (Signed)
Summary: Colonoscopy  Patient: Robert Bates Note: All result statuses are Final unless otherwise noted.  Tests: (1) Colonoscopy (COL)   COL Colonoscopy           DONE     [facility]     [address]     [place], [place]  [address]           COLONOSCOPY PROCEDURE REPORT           PATIENT:  [name], [name]  MR#:  [id number]     BIRTHDATE:  November 16, 1940, 68 yrs. old  GENDER:  male     ENDOSCOPIST:  Rachael Fee, MD     Referred by:  Thomos Lemons, DO     PROCEDURE DATE:  12/08/2009     PROCEDURE:  Colonoscopy with snare polypectomy     ASA CLASS:  Class II     INDICATIONS:  Routine Risk Screening     MEDICATIONS:   Fentanyl 50 mcg IV, Versed 7 mg IV           DESCRIPTION OF PROCEDURE:   After the risks benefits and     alternatives of the procedure were thoroughly explained, informed     consent was obtained.  Digital rectal exam was performed and     revealed no rectal masses.   The LB [id number]-[id number] endoscope     was introduced through the anus and advanced to the cecum, which     was identified by both the appendix and ileocecal valve, without     limitations.  The quality of the prep was good, using MoviPrep.     The instrument was then slowly withdrawn as the colon was fully     examined.     <<PROCEDUREIMAGES>>     FINDINGS: Three small polyps were found, removed and retrieved     (pathology jar 1). These were all sessile, ranged in size from 2     to 5mm, were removed with cold snare, located in transverse and     sigmoid colon segments (see image6 and image8).  Moderate     diverticulosis was found in left colon (see image1).  This was     otherwise a normal examination of the colon (see image2, image3,     and image9).   Retroflexed views in the rectum revealed no     abnormalities.    The scope was then withdrawn from the patient     and the procedure completed.     COMPLICATIONS:  None           ENDOSCOPIC IMPRESSION:     1) Three subcentimeter  polyps, all removed and sent to pathology           2) Moderate diverticulosis in left colon     3) Otherwise normal examination           RECOMMENDATIONS:     1) If the polyp(s) removed today are proven to be adenomatous     (pre-cancerous) polyps, you will need a colonoscopy in 3-5 years.     Otherwise you should continue to follow colorectal cancer     screening guidelines for "routine risk" patients with a     colonoscopy in 10 years.     2) You will receive a letter within 1-2 weeks with the results     of your biopsy as well as final recommendations. Please call my     office if you have not received  a letter after 3 weeks.           ______________________________     Rachael Fee, MD           n.     eSIGNED:   Rachael Fee at 12/08/2009 10:51 AM           [name], [id number]  Note: An exclamation mark (!) indicates a result that was not dispersed into the flowsheet. Document Creation Date: 12/08/2009 10:52 AM _______________________________________________________________________  (1) Order result status: Final Collection or observation date-time: 12/08/2009 10:46 Requested date-time:  Receipt date-time:  Reported date-time:  Referring Physician:   Ordering Physician: Rob Bunting [phone]) Specimen Source:  Source: Launa Grill Order Number: [id number] Lab site:   Appended Document: Colonoscopy recall     Procedures Next Due Date:    Colonoscopy: 12/2012

## 2010-12-04 NOTE — Miscellaneous (Signed)
Summary: [facility]  Clinical Lists Changes  Medications: Added new medication of MOVIPREP 100 GM  SOLR (PEG-KCL-NACL-NASULF-NA ASC-C) As per prep instructions. - Signed Rx of MOVIPREP 100 GM  SOLR (PEG-KCL-NACL-NASULF-NA ASC-C) As per prep instructions.;  #1 x 0;  Signed;  Entered by: Ezra Sites RN;  Authorized by: Rachael Fee MD;  Method used: Electronically to [facility] [phone]*, [address] [place], [place]  [address], Ph: [phone], Fax: [phone] Allergies: Changed allergy or adverse reaction from IODINE to IODINE    Prescriptions: MOVIPREP 100 GM  SOLR (PEG-KCL-NACL-NASULF-NA ASC-C) As per prep instructions.  #1 x 0   Entered by:   Ezra Sites RN   Authorized by:   Rachael Fee MD   Signed by:   Ezra Sites RN on 11/24/2009   Method used:   Electronically to        [facility] [phone]* (retail)       [address]       [place], [place]  [address]       Ph: [phone]       Fax: [phone]   RxID:   [id number]

## 2010-12-04 NOTE — Progress Notes (Signed)
Summary: requesting generic for Tricor  Phone Note Call from Patient Call back at [phone]   Caller: Patient Call For: Lemont Fillers FNP Summary of Call: Pt left voice message stating that he went to pick up rx and he needs a generic.?  Attempted to reach pt to clarify which med needs generic and voicemail not set up, unable to leave message.  Nicki Guadalajara Fergerson CMA Duncan Dull)  May 21, 2010 4:28 PM   Follow-up for Phone Call        Pt called back stating that he would like a generic for Tricor.  Please advise.  Nicki Guadalajara Fergerson CMA Duncan Dull)  May 21, 2010 4:33 PM  Follow-up by: Lemont Fillers FNP,  May 21, 2010 4:46 PM  Additional Follow-up for Phone Call Additional follow up Details #1::        fenofibrate sent to pharmacy. Pls notify patient. Additional Follow-up by: Lemont Fillers FNP,  May 21, 2010 4:46 PM    Additional Follow-up for Phone Call Additional follow up Details #2::    Pt notified.  Nicki Guadalajara Fergerson CMA Duncan Dull)  May 21, 2010 4:55 PM   New/Updated Medications: FENOFIBRATE 160 MG TABS (FENOFIBRATE) One tablet by mouth daily Prescriptions: FENOFIBRATE 160 MG TABS (FENOFIBRATE) One tablet by mouth daily  #30 x 2   Entered and Authorized by:   Lemont Fillers FNP   Signed by:   Lemont Fillers FNP on 05/21/2010   Method used:   Electronically to        [facility] [phone]* (retail)       [address]       [place], [place]  [address]       Ph: [phone]       Fax: [phone]   RxID:   [id number]

## 2010-12-04 NOTE — Progress Notes (Signed)
Summary: appt.  Phone Note Call from Patient   Caller: Patient Details for Reason: Medication  call in  Summary of Call: Patient wants medication called in , symptoms  fever, cough, body aches, congestion.  oftered him an  appt for today but says he cannot get here   please call pt   [phone]   Initial call taken by: Darral Dash,  January 08, 2010 12:39 PM  Follow-up for Phone Call        Patient needs to be seen- pls call patient and offer him an appt this afternoon or tomorrow.   If he is unable to come during these times he can try going to an urgent care this evening. Follow-up by: Lemont Fillers FNP,  January 08, 2010 12:51 PM  Additional Follow-up for Phone Call Additional follow up Details #1::        Attempted to call pt. Pt number could not receive messages.  Unable to leave message. Additional Follow-up by: Mervin Kung CMA,  January 08, 2010 5:14 PM    Additional Follow-up for Phone Call Additional follow up Details #2::    Advised pt. he needs to be seen before antibiotic can be prescribed. Pt. states he is very tired and doesn't know if he can make it in. I advised pt. to call if he decides he wants to be seen. Follow-up by: Mervin Kung CMA,  January 09, 2010 8:41 AM

## 2010-12-04 NOTE — Assessment & Plan Note (Signed)
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   Allergies: 1)  ! Iodine Laboratory Results   Urine Tests  Date/Time Received: 06/08/10 Date/Time Reported: Mervin Kung CMA (AAMA)  June 08, 2010 1:35 PM   Routine Urinalysis   Color: yellow Appearance: Clear Glucose: negative   (Normal Range: Negative) Bilirubin: negative   (Normal Range: Negative) Ketone: negative   (Normal Range: Negative) Spec. Gravity: 1.010   (Normal Range: 1.003-1.035) Blood: trace-lysed   (Normal Range: Negative) pH: 5.0   (Normal Range: 5.0-8.0) Protein: negative   (Normal Range: Negative) Urobilinogen: 0.2   (Normal Range: 0-1) Nitrite: negative   (Normal Range: Negative) Leukocyte Esterace: trace   (Normal Range: Negative)    Comments: Urine cultured. Nicki Guadalajara Fergerson CMA (AAMA)  June 08, 2010 1:36 PM     Orders Added: 1)  UA Dipstick w/o Micro (manual) [81002] 2)  Specimen Handling [99000] 3)  T-Culture, Urine [[id number]]

## 2010-12-04 NOTE — Letter (Signed)
   [place] at [facility] [address] [place], [place]  [address]  [place] Phone: [phone]      July 30, 2010   [facility] [place] [address] [place], [place] [address]  RE:  LAB RESULTS  Dear  Mr. Lattner,  The following is an interpretation of your most recent lab tests.  Please take note of any instructions provided or changes to medications that have resulted from your lab work.  LIPID PANEL:  Good - no changes needed Triglyceride: 128   Cholesterol: 150   LDL: 78   HDL: 46   Chol/HDL%:  3.3 Ratio  Cholesterol looks much better.  Keep up the good work.   Sincerely Yours,    Lemont Fillers FNP  Appended Document:  Mailed.

## 2010-12-04 NOTE — Letter (Signed)
   [place] at [facility] [address] [place], [place]  [address]  [place] Phone: [phone]      May 21, 2010   [facility] [place] [address] [place], [place] [address]  RE:  LAB RESULTS  Dear  Mr. Rosenau,  The following is an interpretation of your most recent lab tests.  Please take note of any instructions provided or changes to medications that have resulted from your lab work.  ELECTROLYTES:  Good - no changes needed  KIDNEY FUNCTION TESTS:  Good - no changes needed  LIVER FUNCTION TESTS:  Good - no changes needed  LIPID PANEL:  Fair - review at your next visit Triglyceride: 174   Cholesterol: 203   LDL: 123   HDL: 45   Chol/HDL%:  4.5 Ratio   CBC:  Good - no changes needed Your Triglycerides are high.  Please make the following dietary changes.  Your triglycerides are high.  Please make the following nutritional changes: 1.  Avoid white bread, white pasta and white rice 2.  Avoid high fructose corn syrup 3.  Instead eat brown carbs- Yams, Wheat pasta, whole grained breads, wild rice.   I have added a medication called Tricor for you to take once daily.  Please arrange a follow up appointment in 2 months, come fasting to this appointment.   Sincerely Yours,    Lemont Fillers FNP

## 2010-12-04 NOTE — Assessment & Plan Note (Signed)
Summary: 3 month follow up/mhf--Rm 4   Vital Signs:  Patient profile:   70 year old male Height:      69 inches Weight:      168.50 pounds BMI:     24.97 Temp:     98.0 degrees F oral Pulse rate:   84 / minute Pulse rhythm:   regular Resp:     16 per minute BP sitting:   100 / 72  (right arm) Cuff size:   regular  Vitals Entered By: Mervin Kung CMA Duncan Dull) (October 09, 2010 10:56 AM) CC: 3 month f/u, fasting. 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 Also gets weakness in his right foot.  Seems to be worse with driving. States his left leg stays swollen. Is Patient Diabetic? No Pain Assessment Patient in pain? no      Comments Pt states he isn't taking Fish Oil like he should. Pt agrees all other med doses and directions are correct. Nicki Guadalajara Fergerson CMA Duncan Dull)  October 09, 2010 11:03 AM    Primary Care Provider:  Lemont Fillers FNP  CC:  3 month f/u and fasting. 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 Also gets weakness in his right foot.  Seems to be worse with driving. States his left leg stays swollen.[name]  History of Present Illness: Robert Bates is a 70 year old male who presents with Cheif complaint of back pain.  1) Low back pain- complaint of right anterior thigh pain which is associated with right buttock pain and weakness in his right foot.  Symptoms are worsened by driving a truck.  Symptoms are improved by certain positions.  Working 60 hours a week.  Has not tried any medication for this pain.  Symptoms are not associated with numbness.   2) Mole on back- turned "black" according to patient's wife.   3)LLE swelling-  Patient reports that the swelling in his left leg is new.  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     Allergies: 1)  ! Iodine  Review of Systems       + swelling LLE.   Physical Exam  General:  Well-developed,well-nourished,in no  acute distress; alert,appropriate and cooperative throughout examination Head:  Normocephalic and atraumatic without obvious abnormalities. No apparent alopecia or balding. Lungs:  Normal respiratory effort, chest expands symmetrically. Lungs are clear to auscultation, no crackles or wheezes. Heart:  Normal rate and regular rhythm. S1 and S2 normal without gallop, murmur, click, rub or other extra sounds. Extremities:  2+ LLE swelling up to knee.  Trace swelling noted in RLE.  1-2+ bilateral DP/PT pulses Neurologic:  Strength is 5/5 in bilateral lower extremities.   Skin:  small brown raised dry skin keratosis on left upper back (no concerning lesion noted) Psych:  Cognition and judgment appear intact. Alert and cooperative with normal attention span and concentration. No apparent delusions, illusions, hallucinations   Detailed Back/Spine Exam  Lumbosacral Exam:  Lying Straight Leg Raise:    Right:  negative    Left:  negative Toe Walking:    Right:  normal    Left:  normal Heel Walking:    Right:  normal    Left:  normal   Impression & Recommendations:  Problem # 1:  EDEMA (ICD-782.3) Assessment New Patient with new LLE edema.  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  LLE doppler performed- negative for DVT. Orders: Misc. Referral (Misc. Ref)  Problem # 2:  SCIATICA, RIGHT (ICD-724.3) Assessment: New 70 year old male with right sided sciatic pain.  X-ray notes DDD L5/S1-  will try conservative treatment with meloxicam for now.  If no improvement, will order MRI for further evaluation. His updated medication list for this problem includes:    Aspirin Ec 325 Mg Tbec (Aspirin) ..... One by mouth once daily    Meloxicam 7.5 Mg Tabs (Meloxicam) ..... One tablet by mouth once daily as needed for back/thigh pain  Orders: Lumbar Spine Complete, 5 Views (71110TC)  Problem # 3:  KERATOSIS (ICD-701.1) Assessment: New patient with benign appearing keratosis noted on  back.  Monitor.  Complete Medication List: 1)  Omeprazole 40 Mg Cpdr (Omeprazole) .... One cap by mouth once daily 2)  Aspirin Ec 325 Mg Tbec (Aspirin) .... One by mouth once daily 3)  Simvastatin 40 Mg Tabs (Simvastatin) .... One tablet by mouth daily 4)  Fish Oil Concentrate 1000 Mg Caps (Omega-3 fatty acids) .... Two caps by mouth two times a day 5)  Meloxicam 7.5 Mg Tabs (Meloxicam) .... One tablet by mouth once daily as needed for back/thigh pain  Patient Instructions: 1)  Please complete your x-ray and ultrasound downstairs today. 2)  Follow up in 1 month, call if symptoms worsen prior to your appointment.  Prescriptions: MELOXICAM 7.5 MG TABS (MELOXICAM) one tablet by mouth once daily as needed for back/thigh pain  #15 x 0   Entered and Authorized by:   Lemont Fillers FNP   Signed by:   Lemont Fillers FNP on 10/09/2010   Method used:   Electronically to        [facility] [phone]* (retail)       [address]       [place], [place]  [address]       Ph: [phone]       Fax: [phone]   RxID:   [id number]    Orders Added: 1)  Lumbar Spine Complete, 5 Views [71110TC] 2)  Misc. Referral [Misc. Ref] 3)  Est. Patient Level IV [24401]    Current Allergies (reviewed today): ! IODINE  Appended Document: 3 month follow up/mhf--Rm 4     Allergies: 1)  ! Iodine  Past History:  Past Medical History: Last updated: 12/26/2008 GERD Alcoholism - past problem per patient - but admits to still drinking - unclear how much tobacco abuse hematuria - refuses work up or referral - understands risks of morbidity/mortality - 11/2008, 12/2008    Past Surgical History: Last updated: 08/11/2008 Tonsillectomy   Family History: Reviewed history from 05/14/2010 and no changes required. Family History of  leukemia Family History of Stroke   Mom- deceased, died in her 52's.  Dad- deceased, at age 53, leukemia Son- from first [place], Emergency planning/management officer,  health Son- from 3rd [employer], learning disability.  Social History: Reviewed history from 05/14/2010 and no changes required. 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 One cap by mouth once daily 2)  Aspirin Ec 325 Mg Tbec (Aspirin) .... One by mouth once daily 3)  Simvastatin 40 Mg Tabs (Simvastatin) .... One tablet by mouth daily 4)  Fish Oil Concentrate 1000 Mg Caps (Omega-3 fatty acids) .... Two caps by mouth  two times a day 5)  Meloxicam 7.5 Mg Tabs (Meloxicam) .... One tablet by mouth once daily as needed for back/thigh pain

## 2010-12-04 NOTE — Progress Notes (Signed)
Summary: omeprazole   Phone Note Refill Request Message from:  Fax from Pharmacy on December 12, 2009 8:04 AM  Refills Requested: Medication #1:  OMEPRAZOLE 40 MG CPDR one cap by mouth once daily   Dosage confirmed as above?Dosage Confirmed   Brand Name Necessary? No   Supply Requested: 1 month   Last Refilled: 11/17/2009  Method Requested: Electronic Next Appointment Scheduled: 02-16-2010 [place]  Initial call taken by: Roselle Locus,  December 12, 2009 8:05 AM    Prescriptions: OMEPRAZOLE 40 MG CPDR (OMEPRAZOLE) one cap by mouth once daily  #30 x 3   Entered by:   Doristine Devoid   Authorized by:   Lemont Fillers FNP   Signed by:   Doristine Devoid on 12/12/2009   Method used:   Electronically to        [facility] [phone]* (retail)       [address]       [place], [place]  [address]       Ph: [phone]       Fax: [phone]   RxID:   [id number]

## 2010-12-04 NOTE — Assessment & Plan Note (Signed)
Summary: Lower abd pain   Vital Signs:  Patient Profile:   70 Years Old Male Height:     69 inches Weight:      175.25 pounds BMI:     25.97 Temp:     97.7 degrees F oral Pulse rate:   80 / minute Pulse rhythm:   regular Resp:     18 per minute BP sitting:   126 / 80  (right arm) Cuff size:   regular  Pt. in pain?   yes    Location:   pelvis    Intensity:   10    Type:       sharp  Vitals Entered By: Glendell Docker CMA (August 11, 2008 1:14 PM)                  PCP:  Paulo Fruit MD  Chief Complaint:  abdominal Pain onset Saturday and Abdominal pain.  History of Present Illness:  Abdominal Pain      This is a 70 year old man who presents with Abdominal pain.  The patient denies nausea, vomiting, melena, and hematochezia.  The location of the pain is suprapubic.  The pain is described as intermittent and sharp.  Associated symptoms include hematuria.  The patient denies fever or chills.  He notes weak urinary stream.    Current Allergies (reviewed today): ! IODINE  Past Medical History:    GERD - past problem    Alcoholism - past problem per patient       Past Surgical History:    Tonsillectomy    Social History:    Patient is very evasive about social history    Married     Current Smoker    Risk Factors:  Tobacco use:  current   Review of Systems      See HPI   Physical Exam  General:     alert, well-developed, and well-nourished.   Head:     normocephalic and atraumatic.   Lungs:     normal respiratory effort and normal breath sounds.   Heart:     normal rate, regular rhythm, and no gallop.   Abdomen:     suprapubic tenderness.  soft, no guarding, and no rigidity.   Prostate:     tender, boggy, and 1+ enlarged.      Impression & Recommendations:  Problem # 1:  ABDOMINAL PAIN, LOWER (ICD-789.09) 70 y/o with hematuria, supra pubic pain, and prostate tenderness.   Probable prostatitis.   70 referred to ER for foley catheter to  rule out urinary retention.  Dr. Rosalia Hammers reports 15 cc of urine.  Pt started on cipro 500 mg two times a day x 10 days.   Samples of flomax 0.4 mg by mouth at bedtime provided.   Patient advised to call office if symptoms persist or worsen.  Orders: Urology Referral (Urology)   Problem # 2:  GROSS HEMATURIA (ICD-599.71) Pt with long hx of tobacco abuse.   He will need urologic follow up/evaluation to rule out bladder lesion.  The following medications were removed from the medication list:    Penicillin V Potassium 500 Mg Tabs (Penicillin v potassium)    Ampicillin 500 Mg Caps (Ampicillin)  His updated medication list for this problem includes:    Ciprofloxacin Hcl 500 Mg Tabs (Ciprofloxacin hcl) ..... One by mouth two times a day  Orders: Urology Referral (Urology)   Complete Medication List: 1)  Ciprofloxacin Hcl 500 Mg Tabs (Ciprofloxacin hcl) .Marland KitchenMarland KitchenMarland Kitchen  One by mouth two times a day 2)  Flomax 0.4 Mg Xr24h-cap (Tamsulosin hcl) .... One by mouth once daily  Other Orders: UA Dipstick w/o Micro (manual) (11914) T-Urine Culture (Spectrum Order) ([id number])   Patient Instructions: 1)  Please schedule a follow-up appointment in 1 week with Dr. Andrey Campanile.   Call the office if your symptoms get worse. 2)  You will be evaluated today by Dr. Rosalia Hammers at [facility] Emergency Room. 3)  A foley catheter will be placed to make sure you are not in urinary retention. 4)  Take Flomax at bedtime. 5)  Take Cipro twice daily until gone.   Prescriptions: CIPROFLOXACIN HCL 500 MG TABS (CIPROFLOXACIN HCL) one by mouth two times a day  #20 x 0   Entered and Authorized by:   D. Thomos Lemons DO   Signed by:   D. Thomos Lemons DO on 08/11/2008   Method used:   Print then Give to Patient   RxID:   [id number] CIPROFLOXACIN HCL 500 MG TABS (CIPROFLOXACIN HCL) one by mouth two times a day  #20 x 0   Entered and Authorized by:   D. Thomos Lemons DO   Signed by:   D. Thomos Lemons DO on 08/11/2008   Method  used:   Electronically to        [facility] [phone]* (retail)       [address]       [place], [place]  [address]       Ph: [phone]       Fax: [phone]   RxID:   [id number]  ]  Orders Added: 1)  UA Dipstick w/o Micro (manual) [81002] 2)  T-Urine Culture (Spectrum Order) [[id number]] 3)  Urology Referral [Urology] 4)  Est. Patient Level III [23557]   Current Allergies (reviewed today): ! IODINE  Laboratory Results   Urine Tests    Routine Urinalysis   Color: straw Appearance: Hazy Glucose: negative   (Normal Range: Negative) Bilirubin: negative   (Normal Range: Negative) Ketone: negative   (Normal Range: Negative) Spec. Gravity: <1.005   (Normal Range: 1.003-1.035) Blood: moderate   (Normal Range: Negative) pH: 8.5   (Normal Range: 5.0-8.0) Protein: negative   (Normal Range: Negative) Urobilinogen: 0.2   (Normal Range: 0-1) Nitrite: negative   (Normal Range: Negative) Leukocyte Esterace: negative   (Normal Range: Negative)

## 2010-12-04 NOTE — Assessment & Plan Note (Signed)
Summary: cpx patient fasting/mhf--Rm 3   Vital Signs:  Patient profile:   70 year old male Height:      69 inches Weight:      172.25 pounds BMI:     25.53 Temp:     97.8 degrees F oral Pulse rate:   84 / minute Pulse rhythm:   regular Resp:     16 per minute BP sitting:   104 / 78  (right arm) Cuff size:   regular  Vitals Entered By: Mervin Kung CMA Duncan Dull) (May 14, 2010 8:35 AM) CC: Room 3   Fasting, Physical. Needs refill on Simvastatin. Is Patient Diabetic? No Comments Pt has completed Prednisone and Cipro.   Vision Screening:Left eye w/o correction: 20 / 50 Right Eye w/o correction: 20 / 60 Both eyes w/o correction:  20/ 50       Vision Comments: Wears glasses for reading only.  Vision Entered By: Mervin Kung CMA Duncan Dull) (May 14, 2010 11:08 AM)  Hearing Screen 25db HL: Left  500 hz: No Response 1000 hz: No Response 2000 hz: No Response 4000 hz: No Response Right  500 hz: No Response 1000 hz: No Response 2000 hz: No Response 4000 hz: No Response  40db HL: Left  500 hz: 40db 1000 hz: No Response 2000 hz: 40db 4000 hz: No Response Right  500 hz: 40db 1000 hz: No Response 2000 hz: 40db 4000 hz: No Response Audiometry Comment: Pt's ears occluded with wax bilaterally.  Washed ears without success. Notified Sandford Craze, NP. Nicki Guadalajara Fergerson CMA Duncan Dull)  May 14, 2010 11:10 AM     Primary Care Provider:  Paulo Fruit MD  CC:  Room 3   Fasting and Physical. 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   Preventative-  + walking, active with dogs.  Diet-  notes that he is trying to eat healthier.  He has not had a pneumovax.  Continues to smoke cigarettes.   Preventive Screening-Counseling &  Management  Alcohol-Tobacco     Alcohol drinks/day: 0     Smoking Status: current     Smoking Cessation Counseling: yes     Packs/Day: 0.5  Caffeine-Diet-Exercise     Caffeine use/day: 5 cups coffee daily  Allergies: 1)  ! Iodine  Family History: Family History of  leukemia Family History of Stroke   Mom- deceased, died in her 59's.  Dad- deceased, at age 31, leukemia Son- from first [place], Emergency planning/management officer, health Son- from 3[employer] [place], learning disability.  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 Resp: Denies cough CV:  Denies Chest Pain GI:  Denies nausea or vomitting GU: Denies dysuria Lymphatic: Denies lymphadenopathy Musculoskeletal:  Chronic right elbow pain Skin:  Denies Rashes or  Psychiatric: Denies depression , or anxiety- + stress with marraige Neuro: Denies numbness      Physical Exam  General:  Elderly white male, appears older than stated age. Head:  Normocephalic and atraumatic  without obvious abnormalities. No apparent alopecia or balding. Eyes:  PERRLA,  + senile arcus Ears:  Bilateral cerumen impaction Mouth:  Oral mucosa and oropharynx without lesions or exudates.  Teeth in good repair. Neck:  No deformities, masses, or tenderness noted. Lungs:  Normal respiratory effort, chest expands symmetrically. Lungs are clear to auscultation, no crackles or wheezes. Heart:  Normal rate and regular rhythm. S1 and S2 normal without gallop, murmur, click, rub or other extra sounds. Abdomen:  Bowel sounds positive,abdomen soft and non-tender without masses, organomegaly or hernias noted. Genitalia:  Testes bilaterally descended without nodularity, tenderness or masses. No scrotal masses or  lesions. No penis lesions or urethral discharge. Prostate:  Prostate gland firm and smooth, no enlargement, nodularity, tenderness, mass, asymmetry or induration. Heme negative. Msk:  No deformity or scoliosis noted of thoracic or lumbar spine.   Extremities:  No clubbing, cyanosis, edema, or deformity noted with normal full range of motion of all joints.   Neurologic:  alert & oriented X3, cranial nerves II-XII intact, strength normal in all extremities, gait normal, and DTRs symmetrical and normal.   Skin:  + nodule behind right right leg. Cervical Nodes:  No lymphadenopathy noted Psych:  Cognition and judgment appear intact. Alert and cooperative with normal attention span and concentration. No apparent delusions, illusions, hallucinations   Impression & Recommendations:  Problem # 1:  MICROSCOPIC HEMATURIA (ICD-599.72) Assessment New Evaluated by urology in February.  He continues to have microscopic hematuria (per ua today). He had a renal ultrasound ordered by Dr. Brunilda Payor which noted renal calculi and renal cysts.  It was recommended that he have cystoscopy.  It is unclear if he completed this.  Will contact urology and see if patient followed through with this.  The following medications were removed from the medication list:    Ciprofloxacin Hcl 500 Mg Tabs (Ciprofloxacin hcl) ..... One by mouth two times a day  Problem # 2:  GERD (ICD-530.81) Assessment: Unchanged Stable. His updated medication list for this problem includes:    Omeprazole 40 Mg Cpdr (Omeprazole) ..... One cap by mouth once daily  Problem # 3:  LEUKOCYTOSIS (ICD-288.60) Assessment: Comment Only Pt was noted last lab draw to have mild elevated WBC.  Will repeat today. Orders: T-CBC w/Diff ([id number])  Problem # 4:  HYPERLIPIDEMIA (ICD-272.4) Assessment: Comment Only Continues simvastatin- will check FLP as pt is fasting today. His updated medication list for this problem includes:    Simvastatin 40 Mg Tabs  (Simvastatin) ..... One tablet by mouth daily  Orders: T-Hepatic Function [id number]) T-Lipid Profile [phone])  Problem # 5:  CERUMEN IMPACTION, BILATERAL (ICD-380.4) Assessment: New Ears flushed with minimal improvement.  He did not pass hearing test in the office.  Patient was instructed to use debrox and return in 1 week for follow up hearing test.  If patient fails, will plan referral to audiolgy.  Also, vision is 20/50- will need follow up with optometrist.   Complete Medication List: 1)  Omeprazole 40 Mg Cpdr (Omeprazole) .... One cap by mouth once daily 2)  Aspirin Ec 325 Mg Tbec (Aspirin) .... One by mouth once daily 3)  Simvastatin 40 Mg Tabs (Simvastatin) .... One tablet by mouth daily  Other Orders: UA Dipstick w/o Micro (manual) (57846) EKG w/ Interpretation (93000) Pneumococcal Vaccine (96295) Admin 1st Vaccine (28413)  Patient Instructions: 1)  Tobacco is very bad for your health and your loved ones! You Should stop smoking!. 2)  Stop Smoking Tips: Choose a Quit date. Cut  down before the Quit date. decide what you will do as a substitute when you feel the urge to smoke(gum,toothpick,exercise). 3)  Try to walk at least 20 minutes every day. 4)  We will call you when your papers are ready for pick up.      Laboratory Results   Urine Tests  Date/Time Received: 05-14-10 Date/Time Reported: Mervin Kung CMA [facility])  May 14, 2010 11:05 AM   Routine Urinalysis   Color: Amber Appearance: Clear Glucose: negative   (Normal Range: Negative) Bilirubin: negative   (Normal Range: Negative) Ketone: negative   (Normal Range: Negative) Spec. Gravity: 1.015   (Normal Range: 1.003-1.035) Blood: moderate   (Normal Range: Negative) pH: 6.0   (Normal Range: 5.0-8.0) Protein: trace   (Normal Range: Negative) Urobilinogen: 0.2   (Normal Range: 0-1) Nitrite: negative   (Normal Range: Negative) Leukocyte Esterace: negative   (Normal Range: Negative)           Immunizations Administered:  Pneumonia Vaccine:    Vaccine Type: Pneumovax    Site: left deltoid    Mfr: Merck    Dose: 0.5 ml    Route: IM    Given by: Mervin Kung CMA (AAMA)    Exp. Date: 02/22/2011    Lot #: [id number]

## 2010-12-04 NOTE — Progress Notes (Signed)
  Phone Note Outgoing Call   Summary of Call: Tried to contact pt. No answer- voice mail not enabled.  Reviewed his records from Urology- urologist wanted him to have the cystoscopy to look further into the blood in his urine.  We need pt to come in to repeat UA.  Need to see if he still has blood in urine (required for DOT physical).   Initial call taken by: Lemont Fillers FNP,  June 06, 2010 12:55 PM  Follow-up for Phone Call        Pt called back.  Reviewed issues as noted above.  Pt to come in for urinalysis/dip in the office.  Instructed patient to call Dr. Madilyn Hook office to reschedule his cystoscopy as this will help eliminate the possibility of bladder cancer playing a role in his microscopic hematuria. Pt verbalized understanding and agrees to arrange follow up. Follow-up by: Lemont Fillers FNP,  June 06, 2010 1:05 PM

## 2010-12-04 NOTE — Progress Notes (Signed)
Summary: refill-- Simvastatin  Phone Note Refill Request Message from:  Fax from Pharmacy on October 10, 2010 10:53 AM  Refills Requested: Medication #1:  SIMVASTATIN 40 MG TABS one tablet by mouth daily   Dosage confirmed as above?Dosage Confirmed   Brand Name Necessary? No   Supply Requested: 1 month   Last Refilled: 09/01/2010 [facility] [address] [place], [place] fax [phone]   Method Requested: Electronic Next Appointment Scheduled: 10-31-10 Dr Brayton Caves  Initial call taken by: Roselle Locus,  October 10, 2010 10:54 AM    Prescriptions: SIMVASTATIN 40 MG TABS (SIMVASTATIN) one tablet by mouth daily  #30 x 0   Entered by:   Mervin Kung CMA (AAMA)   Authorized by:   Lemont Fillers FNP   Signed by:   Mervin Kung CMA (AAMA) on 10/10/2010   Method used:   Electronically to        [facility] [phone]* (retail)       [address]       [place], [place]  [address]       Ph: [phone]       Fax: [phone]   RxID:   [id number]

## 2010-12-04 NOTE — Progress Notes (Signed)
Summary: CT results  Phone Note Call from Patient Call back at Home Phone [phone]   Caller: Patient Summary of Call: [phone], call pt. back with xray results. Pt. states that he also started runnning a fever after getting contrast for xray. Initial call taken by: Michaelle Copas,  February 01, 2010 3:34 PM  Follow-up for Phone Call        Dr Artist Pais, please advise what to tell pt?  Mervin Kung CMA  February 01, 2010 4:50 PM   Additional Follow-up for Phone Call Additional follow up Details #1::        pt hasn't take temp but face feels red. he denies abd pain or ear pain. I reviewed results of CT of abd and pelvis with pt.  He was advised to start ASA and cholesterol medication.  Pt strongly urged to stop smoking.  I reviewed CT of abd with Dr. Excell Seltzer.  Mural thrombus can be common finding in AAA.  he suggests asa therapy  switch abx to cipro.   if he feels feverish tomorrow- follow up with Melissa  otherwise OV on Monday Additional Follow-up by: D. Thomos Lemons DO,  February 01, 2010 5:22 PM    New/Updated Medications: CIPROFLOXACIN HCL 500 MG TABS (CIPROFLOXACIN HCL) one by mouth two times a day ASPIRIN EC 325 MG TBEC (ASPIRIN) one by mouth once daily LIPITOR 40 MG TABS (ATORVASTATIN CALCIUM) one by mouth once daily Prescriptions: LIPITOR 40 MG TABS (ATORVASTATIN CALCIUM) one by mouth once daily  #30 x 2   Entered and Authorized by:   D. Thomos Lemons DO   Signed by:   D. Thomos Lemons DO on 02/01/2010   Method used:   Electronically to        [facility] [phone]* (retail)       [address]       [place], [place]  [address]       Ph: [phone]       Fax: [phone]   RxID:   [id number] ASPIRIN EC 325 MG TBEC (ASPIRIN) one by mouth once daily  #100 x 3   Entered and Authorized by:   D. Thomos Lemons DO   Signed by:   D. Thomos Lemons DO on 02/01/2010   Method used:   Electronically to        [facility] [phone]* (retail)       [address]       [place], [place]   [address]       Ph: [phone]       Fax: [phone]   RxID:   [id number] CIPROFLOXACIN HCL 500 MG TABS (CIPROFLOXACIN HCL) one by mouth two times a day  #14 x 0   Entered and Authorized by:   D. Thomos Lemons DO   Signed by:   D. Thomos Lemons DO on 02/01/2010   Method used:   Electronically to        [facility] [phone]* (retail)       [address]       [place], [place]  [address]       Ph: [phone]       Fax: [phone]   RxID:   [id number]

## 2010-12-04 NOTE — Assessment & Plan Note (Signed)
Summary: 4 months rov-ch   Vital Signs:  Patient Profile:   70 Years Old Male Height:     69 inches Weight:      177 pounds BMI:     26.23 O2 treatment:    Room Air Temp:     97.6 degrees F oral Pulse rate:   80 / minute Pulse rhythm:   regular Resp:     18 per minute BP sitting:   120 / 72  (right arm) Cuff size:   regular  Vitals Entered By: Darra Lis RMA (November 24, 2008 1:06 PM)                 Visit Type:  acute PCP:  Paulo Fruit MD  Chief Complaint:  acute - GERD sx .  History of Present Illness: Patient c/o GERD symptom flare including heartburn and sour taste in mouth when laying down.  Has had in the past, but was doing well off meds for years.  Now for the past month, the symptoms have returned.  patient admits that he is drinking alcohol again, but states "Not enough to worry about."  Patient admits to having a serious alcohol problem in the past and says he goes long periods without drinking, but has started again.  Patient has used over the counter rolaids with some relief, but feels he needs prescription medication.  no other concerns  Patient had been seen by Dr. Artist Pais in October and he referred the patient to urology for hematuria, but patient never went.  Talked to him about the fact that the blood in the urine could potentially be from cancer in the bladder - among very many other possible etiologies.  Patient refuses the referral and refuses recheck of urine.  He says he understands and accepts the risk of morbidity/mortality.  patient refuses colonoscopy - understands risks of morbidity/mortality - 11/2008 patient refuses PSA - informed of risks / benefits - 11/2008 patient refuses screening labwork - informed of risks for not diagnosis serious diagnosis - 11/2008    Updated Prior Medication List: No Medications Current Allergies (reviewed today): ! IODINE  Past Medical History:    GERD    Alcoholism - past problem per patient - but admits to  still drinking - unclear how much    patient refuses colonoscopy - understands risks of morbidity/mortality - 11/2008    patient refuses PSA - informed of risks / benefits - 11/2008    patient refuses screening labwork - informed of risks for not diagnosis serious diagnosis - 11/2008    tobacco abuse    hematuria - refuses work up or referral - understands risks of morbidity/mortality - 11/2008       Past Surgical History:    Reviewed history from 08/11/2008 and no changes required:       Tonsillectomy    Family History:    Reviewed history from 07/25/2008 and no changes required:       Family History of  leukemia       Family History of Stroke   Social History:    retired Naval architect    Married - 3rd marriage -     2 children    Current Smoker   Risk Factors:  Tobacco use:  current    Year started:  1959    Cigarettes:  Yes -- 1 pack(s) per day    Counseled to quit/cut down tobacco use:  yes Passive smoke exposure:  yes Drug use:  no HIV  high-risk behavior:  no Caffeine use:  5+ - patient told to cut back drinks per day Alcohol use:  yes    Type:  beer, wine - unclear how much he drinks    Has patient --       Felt need to cut down:  yes       Been annoyed by complaints:  yes       Felt guilty about drinking:  yes       Needed eye opener in the morning:  yes    Comments:  patient admits that he used to have an alcoho problem, but "I don't have a problem any more."l     Counseled to quit/cut down alcohol use:  yes Exercise:  no Seatbelt use:  100 % Sun Exposure:  frequently  Family History Risk Factors:    Family History of MI in females < 40 years old:  no    Family History of MI in males < 60 years old:  no   Review of Systems       no nausea / vomiting / diarrhea / fever / chills / chest pain / SOB    Physical Exam  General:     Well-developed, well nourished, well hydrated, in no acute distress Head:     Normocephalic and atraumatic without obvious  abnormalities.  Eyes:     No corneal or conjunctival inflammation.  Vision grossly normal. Ears:     External ear shows no significant lesions or deformities.  Hearing is grossly normal. Nose:     External nasal examination shows no deformity or inflammation.  Neck:     supple, full ROM   Lungs:     Normal respiratory effort, chest expands symmetrically with good air flow noted. Lungs clear to auscultation with no crackles, rales or wheezes.   Heart:     Normal rate and  rhythm. S1 and S2 normal, without gallop, murmur, click, rub  Abdomen:     Bowel sounds positive, abdomen soft and non-tender without masses, organomegaly or hernias noted. no distention  Extremities:     No cyanosis, edema, or deformity noted, grossly normal full range of motion with all joints.   Neurologic:     alert & oriented X3,  gait normal.   neuro is grossly intact Skin:     without suspicious lesions or rashes.   Psych:     Cognition and judgment appear intact. Alert and oriented x 3.  No apparent delusions, illusions, hallucinations, not anxious or depressed appearing.  Patient is somewhat evasive and distracted easily during the visit, but it appears to be his discomfort with being in a medical office more than anything else.      Impression & Recommendations:  Problem # 1:  GERD (ICD-530.81) Handout given.  Kapidex samples #5 given one by mouth once daily, then after those are finished, he will start generic omeprazole.  Reviewed "red flag" symptoms that would require immediate medical attention. Patient to call if symptoms increase / change / persist or any concerns.  follow up in one month His updated medication list for this problem includes:    Omeprazole 40 Mg Cpdr (Omeprazole) ..... One cap by mouth once daily   Complete Medication List: 1)  Omeprazole 40 Mg Cpdr (Omeprazole) .... One cap by mouth once daily   Patient Instructions: 1)  Please schedule a PE appointment in 1 month with MD in  the am / fasting.  NOT just a lab appt.  Prescriptions: OMEPRAZOLE 40 MG CPDR (OMEPRAZOLE) one cap by mouth once daily  #30 x 1   Entered and Authorized by:   Paulo Fruit MD   Signed by:   Paulo Fruit MD on 11/24/2008   Method used:   Electronically to        [facility] [phone]* (retail)       [address]       [place], [place]  [address]       Ph: [phone]       Fax: [phone]   RxID:   [id number]

## 2010-12-04 NOTE — Progress Notes (Signed)
Summary: med question  Phone Note Call from Patient   Caller: Patient Summary of Call: pt. wants to know if he can take his meds. all at once or should he take them seprately [phone] Initial call taken by: Michaelle Copas,  February 02, 2010 1:17 PM  Follow-up for Phone Call        Spoke to pt. and reviewed directions for taking medications. Ok to take Lipitor, ASA and Cipro together. Pt voices understanding.  Mervin Kung CMA  February 02, 2010 2:02 PM

## 2010-12-04 NOTE — Progress Notes (Signed)
  Phone Note Other Incoming Call back at [employer]   Caller: Ree Shay Reason for Call: Get patient information Action Taken: Phone Call Completed Details for Reason: Forest Gleason wanted [place] to verify information that was filled out on the DOT forms.  She felt that parts of it were forged by the patient. Details of Request: Forest Gleason faxed copies of the forms that were submitted to her company when the patient applied for a position there.  Details of Action Taken: I called Ree Shay back to let her know that we could not verify any information without a signed authorization from the patient.  Summary of Call: Forest Gleason said she understood.  She did state that she had called other facilities in the past and had been able to get information verified when she needed to.  I explained we could not do that here and the best solution would be to either get the patient to sign an authorization form or to get him to have new forms completed.  She said that at this point she felt it best not to hire him because they had already had him to get the forms completed again. Initial call taken by: Ripley Fraise

## 2010-12-04 NOTE — Progress Notes (Signed)
Summary: DOT paperwork, Side Effects?  Phone Note Call from Patient Call back at [phone]   Caller: Patient Call For: Robert Fillers FNP Summary of Call: Pt left voice message stating that he passed his hearing test with [facility] and wants to know the status of his DOT paperwork. Spoke to [place] at [phone] and she will fax office notes re: hearing test. Mervin Kung CMA Duncan Dull)  June 05, 2010 11:39 AM   Follow-up for Phone Call        Records received. Form forwarded to [facility] for completion. Nicki Guadalajara Fergerson CMA Duncan Dull)  June 05, 2010 3:03 PM   Pt called back stating that he has been getting nauseated and having pain & weakness in both legs since starting the fenofibrate. Also takes Simvastatin. Please advise. Nicki Guadalajara Fergerson CMA Duncan Dull)  June 05, 2010 4:19 PM   Additional Follow-up for Phone Call Additional follow up Details #1::        Spoke with patient- notes that he has not taken Fenofibrate since Sunday.  His nausea and muscle pain are resolved.  He continues the simvastatin without problems.  Recommended to patient that he stay off of Fenofibrate.  Pt to call if he develops recurrent symptoms. Additional Follow-up by: Robert Fillers FNP,  June 05, 2010 5:49 PM

## 2010-12-04 NOTE — Assessment & Plan Note (Signed)
Summary: new to est/mhf   Vital Signs:  Patient profile:   70 year old male Height:      69 inches Weight:      175 pounds BMI:     25.94 Temp:     98.1 degrees F oral Pulse rate:   74 / minute BP sitting:   130 / 80  (left arm)  Vitals Entered By: Doristine Devoid (November 17, 2009 1:47 PM) CC: NEW EST- refill on meds    Primary Care Provider:  Paulo Fruit MD  CC:  NEW EST- refill on meds .  History of Present Illness: Robert Bates is a 70 year old male who presents today for follow up.  Notes that his stomach pain and GERD symptoms are improved since he started the PPI.  Notes that he smokes 1 PPD,  notes rare ETOH.    Hematuria- patient did not follow up with Urology as instructed.  Preventative- up to date on Tetanus as well as flu, declines pneumovax today- but will consider.   Allergies: 1)  ! Iodine  Family History: Reviewed history from 11/24/2008 and no changes required. Family History of  leukemia Family History of Stroke   Social History: Reviewed history from 11/24/2008 and no changes required. 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 Resp: Denies cough CV:  Denies Chest Pain GI:  Denies nausea or vomitting GU: Denies dysuria, denies dysuria, notes nocturia 3x a night Lymphatic: Denies lymphadenopathy Musculoskeletal:  Denies muscle/joint pain Skin:  Denies Rashes, multiple "knots on right forearm and abdomen" Psychiatric: Denies depression or anxiet Neuro: Denies numbness     Physical Exam  General:  Well-developed,well-nourished,in no acute distress; alert,appropriate and cooperative throughout examination Head:  Normocephalic and atraumatic without obvious abnormalities. No apparent alopecia or balding. Eyes:  PERRLA Ears:  External ear exam shows no significant lesions or deformities.   Otoscopic examination reveals clear canals, tympanic membranes are intact bilaterally without bulging, retraction, inflammation or discharge. Hearing is grossly normal bilaterally. Mouth:  has one remaining tooth, no lesions Neck:  No deformities, masses, or tenderness noted. Lungs:  Normal respiratory effort, chest expands symmetrically. Lungs are clear to auscultation, no crackles or wheezes. Heart:  Normal rate and regular rhythm. S1 and S2 normal without gallop, murmur, click, rub or other extra sounds. Abdomen:  some firm nodules noted in soft tissue overlying abdominal musculature.  Mobile and non-tender Msk:  No deformity or scoliosis noted of thoracic or lumbar spine.   Extremities:  No clubbing, cyanosis, edema, or deformity noted with normal full range of motion of all joints.   Neurologic:  alert & oriented X3, strength normal in all extremities, and gait normal.   Skin:  + firm nodules noted on right forearm under skin Cervical Nodes:  No lymphadenopathy noted Psych:  Cognition and judgment appear intact. Alert and cooperative with normal attention span and concentration. No apparent delusions, illusions, hallucinations   Impression & Recommendations:  Problem # 1:  PREVENTIVE HEALTH CARE (ICD-V70.0) Assessment Comment Only  agrees to colonoscopy, decline pneumovax, will refer for colo, patient instructed to return fasting for FLP  Orders: Gastroenterology Referral (GI) T-Comprehensive Metabolic Panel (60454-09811) T-PSA (91478-29562) T-CBC w/Diff (13086-57846) UA Dipstick w/o Micro (manual) (96295)  Problem # 2:  LIPOMA (ICD-214.9) Assessment: New I suspect that these nodules in his abdomen  are lipomas.  Patient is having considerable discomfort from the abdominal nodules, notes that he has trouble wearing a seat belt because of it.  Also has pain in forearm.  He is agreeable to referral to surgery.   Orders: Misc. Referral (Misc. Ref)  Problem # 3:  GROSS HEMATURIA  (ICD-599.71) Assessment: Unchanged  + blood in UA today, advised patient that he should be seen by urology, especially in light of smoking hx.  Will refer again.    Orders: Urology Referral (Urology)  Problem # 4:  GERD (ICD-530.81) Assessment: Improved Sxs stable- continue omeprazole.  His updated medication list for this problem includes:    Omeprazole 40 Mg Cpdr (Omeprazole) ..... One cap by mouth once daily  Problem # 5:  TOBACCO ABUSE (ICD-305.1) Assessment: Comment Only  patient counselled on cessation  Orders: Tobacco use cessation intermediate 3-10 minutes ([address])  Complete Medication List: 1)  Omeprazole 40 Mg Cpdr (Omeprazole) .... One cap by mouth once daily  Patient Instructions: 1)  Please return fasting for a fasting lipid profile (V70) 2)  Try to exercise three times a week,  try to increase your intake of fruits and vegetables.   3)  Please schedule a follow-up appointment in 3 months.   Laboratory Results   Urine Tests    Routine Urinalysis   Glucose: negative   (Normal Range: Negative) Bilirubin: negative   (Normal Range: Negative) Ketone: negative   (Normal Range: Negative) Spec. Gravity: 1.010   (Normal Range: 1.003-1.035) Blood: small   (Normal Range: Negative) pH: 6.0   (Normal Range: 5.0-8.0) Protein: negative   (Normal Range: Negative) Urobilinogen: 0.2   (Normal Range: 0-1) Nitrite: negative   (Normal Range: Negative) Leukocyte Esterace: negative   (Normal Range: Negative)

## 2010-12-04 NOTE — Progress Notes (Signed)
Summary: DOT clarification  Phone Note Outgoing Call   Call placed by: Mervin Kung, cma (aama) Call placed to: Ree Shay [phone] Summary of Call: Received paper work from [employer] needing verification that [employer]. on the form was legitimate. Also questioning why exam date was different than the date listed on the certification form. Explained that all info in question was legitimate and that pt had to be referred to other specialists before we signed off that he passed. Charlsie Merles stated that she would relay the information to her boss and pt would be reconsidered for employment. Nicki Guadalajara Fergerson CMA Duncan Dull)  July 12, 2010 4:45 PM      Appended Document: DOT clarification Forms faxed to Ree Shay [phone] @ 3:35pm.

## 2010-12-04 NOTE — Assessment & Plan Note (Signed)
Summary: 1 week follow up/mhf--Rm 6   Vital Signs:  Patient profile:   70 year old male Height:      69 inches Weight:      173.75 pounds BMI:     25.75 Temp:     97.6 degrees F oral Pulse rate:   72 / minute Pulse rhythm:   regular Resp:     16 per minute BP sitting:   132 / 80  (right arm) Cuff size:   regular  Vitals Entered By: Mervin Kung CMA Duncan Dull) (May 21, 2010 11:19 AM) CC: Room 6 Pt here for recheck of vision and hearing test.  Vision Screening:Left eye w/o correction: 20 / 30 Right Eye w/o correction: 20 / 25 Both eyes w/o correction:  20/ 25        Vision Entered By: Mervin Kung CMA Duncan Dull) (May 21, 2010 11:55 AM)  Hearing Screen 40db HL: Left  500 hz: 40db 2000 hz: 40db Right  500 hz: 40db 2000 hz: 40db Audiometry Comment: Pt's ears clear today bilaterally.  Nicki Guadalajara Fergerson CMA Duncan Dull)  May 21, 2010 11:56 AM     Primary Care Provider:  Paulo Fruit MD  CC:  Room 6 Pt here for recheck of vision and hearing test..  History of Present Illness: Robert Bates is a 70 year old male who presents today for follow up hearing and vision testing.  Allergies: 1)  ! 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  Will refer to audiology.   Vision screening was better this visit as CMA had pt at proper distance from chart this visit.   Orders: Audiology (Audio)  Problem # 2:  HYPERLIPIDEMIA (ICD-272.4) Assessment: Comment Only Triglycerides are elevated despite statin.  Will add fenofibrate.  Pt counseled on dietary changes.  His updated medication list for this problem includes:    Simvastatin 40 Mg Tabs (Simvastatin) ..... One tablet by mouth daily    Fenofibrate 160 Mg Tabs (Fenofibrate) ..... One tablet by mouth daily  Complete Medication List: 1)  Omeprazole 40 Mg Cpdr (Omeprazole) .... One cap by  mouth once daily 2)  Aspirin Ec 325 Mg Tbec (Aspirin) .... One by mouth once daily 3)  Simvastatin 40 Mg Tabs (Simvastatin) .... One tablet by mouth daily 4)  Fenofibrate 160 Mg Tabs (Fenofibrate) .... One tablet by mouth daily

## 2010-12-04 NOTE — Progress Notes (Signed)
Summary: antibiotic request  Phone Note Call from Patient Call back at [facility] Phone [phone]   Summary of Call: He still feels like he may have the flu.  If he takes the rest of the amoxicillin 500mg  capsule.   that he was given the  on 3-28th would that help him feel better.   Initial call taken by: Roselle Locus,  February 12, 2010 2:17 PM  Follow-up for Phone Call        I spoke to pt and he states that he is having episodes of just "feeling bad". States that he has some cough. Wants to know if these symptoms could be coming from some of his medications. If not, he wants to know if he can take the rest of the Amoxicillin he was originally prescribed on 3/28. That med was stopped and Cipro was given to pt to complete.  Please advise?  Mervin Kung CMA  February 12, 2010 2:49 PM   Additional Follow-up for Phone Call Additional follow up Details #1::        Patient should be seen in office.  It is hard to know if amoxicillin will help without examining patient.  I would also like to do some lab work when he comes in. Additional Follow-up by: Lemont Fillers FNP,  February 12, 2010 3:12 PM    Additional Follow-up for Phone Call Additional follow up Details #2::    Advised pt. he would need to be seen before determining what type of antibiotic, if any he would need.  Pt. declines appt. today and states he will call back in a couple of days if he is not better.  Mervin Kung CMA  February 12, 2010 3:26 PM

## 2010-12-04 NOTE — Letter (Signed)
Summary: Primary Care Consult Scheduled Letter  [place] [facility]  [address]   [place], [place] [address]   Phone: [phone]  Fax: [phone]      11/20/2009 MRN: [id number]  [facility] [place] [address] [place], [place]  [address]    Dear Robert Bates,      We have scheduled an appointment for you.  At the recommendation of Sandford Craze , we have scheduled you a consult with [facility] , Dr Brunilda Payor  on November 30, 2009 at 3pm .  Their address is [address] , [place] . The office phone number is [phone].  If this appointment day and time is not convenient for you, please feel free to call the office of the doctor you are being referred to at the number listed above and reschedule the appointment.     It is important for you to keep your scheduled appointments. We are here to make sure you are given good patient care. If you have questions or you have made changes to your appointment, please notify us at  [phone], ask for [name].    Thank you,  Patient Care Coordinator [place] [facility]

## 2010-12-04 NOTE — Letter (Signed)
Summary: CDL Form  CDL Form   Imported By: Lanelle Bal 06/15/2010 14:08:07  _____________________________________________________________________  External Attachment:    Type:   Image     Comment:   External Document

## 2010-12-04 NOTE — Progress Notes (Signed)
Summary: right thigh pain  Phone Note Call from Patient Message from:  Patient on October 05, 2010 4:21 PM  Caller: Patient Call For: Lemont Fillers FNP Summary of Call: Pt states he has had a knot in the back of his right thigh as long as he can remember. 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 Right foot feels like it is getting weak when he presses the accelerator. Pt states symptoms are better when he is not driving a truck. Advised pt he would need to be evaluated in the office. Pt scheduled appt. for 10/09/10 @ 9:30 am. Mervin Kung CMA (AAMA)  October 05, 2010 4:20 PM

## 2010-12-04 NOTE — Progress Notes (Signed)
Summary: pt will be bringing in forms Monday 07/16/10  Phone Note Call from Patient Call back at [facility] Phone [phone]   Summary of Call: Pt will be coming into the office on Monday to get the forms filled out correctly for job Initial call taken by: Lannette Donath,  July 12, 2010 4:42 PM     Appended Document: pt will be bringing in forms Monday 07/16/10 Attempted to contact pt re: forms and give him a status update. Voice mailbox had not been set up yet.   Appended Document: pt will be bringing in forms Monday 07/16/10 Spoke to pt and advised him he did not need to bring additional forms for completion. Jahsiah Carpenter had already corrected previous form. I spoke to Ree Shay on Friday and provided clarification she needed re: form discrepancies. She stated she would pass the info on to her supervisor and would reconsider Mr. Messer application.

## 2010-12-04 NOTE — Letter (Signed)
Summary: Previsit letter  [facility]  [address] [place], [place] [address]   Phone: [phone]  Fax: [phone]       11/20/2009 MRN: [id number]  Robert Bates [address] [place], [place]  [address]  Dear [name],  Welcome to the Gastroenterology Division at [facility].    You are scheduled to see a nurse for your pre-procedure visit on 11-24-09 at 2:30pm  on the 3rd floor at [facility], [address].  We ask that to allow for check-in.  Your nurse visit will consist of discussing your medical and surgical history, your immediate family medical history, and your medications.    Please bring a complete list of all your medications or, if you prefer, bring the medication bottles and we will list them.  We will need to be aware of both prescribed and over the counter drugs.  We will need to know exact dosage information as well.  If you are on blood thinners (Coumadin, Plavix, Aggrenox, Ticlid, etc.) please call our office today/prior to your appointment, as we need to consult with your physician about holding your medication.   Please be prepared to read and sign documents such as consent forms, a financial agreement, and acknowledgement forms.  If necessary, and with your consent, a friend or relative is welcome to sit-in on the nurse visit with you.  Please bring your insurance card so that we may make a copy of it.  If your insurance requires a referral to see a specialist, please bring your referral form from your primary care physician.  No co-pay is required for this nurse visit.     If you cannot keep your appointment, please call [phone] to cancel or reschedule prior to your appointment date.  This allows [place] the opportunity to schedule an appointment for another patient in need of care.    Thank you for choosing [facility] for your medical needs.  We appreciate the opportunity to care for you.  Please visit [place] at our website   to learn more about our practice.                     Sincerely.                                                                                                                   The Gastroenterology Division

## 2010-12-06 NOTE — Assessment & Plan Note (Signed)
Summary: b p /mhf--Rm 3   Vital Signs:  Patient profile:   70 year old male Height:      69 inches Weight:      171.50 pounds BMI:     25.42 O2 Sat:      96 % on Room air Temp:     97.5 degrees F oral Pulse rate:   79 / minute Pulse rhythm:   regular Resp:     16 per minute BP sitting:   100 / 70  (right arm) Cuff size:   regular  Vitals Entered By: Mervin Kung CMA Duncan Dull) (October 31, 2010 10:31 AM)  O2 Flow:  Room air CC: Pt here for follow up.  Pt states he is still having swelling in his left leg and pain in his right leg.  Is Patient Diabetic? No Comments Pt states he is not taking Fish Oil due to the taste. Nicki Guadalajara Fergerson CMA Duncan Dull)  October 31, 2010 10:38 AM    Primary Care Provider:  Lemont Fillers FNP  CC:  Pt here for follow up.  Pt states he is still having swelling in his left leg and pain in his right leg. [name]  History of Present Illness: Mr.  Hemann is a 70 year old male who presents today for follow up.  1) Low back pain- improved.  Right leg pain/sciatic pain, occasional weakness in the right foot.  Improves with pressure (fist under thigh)  2) Tobacco abuse-  Motivated to quit, wants to use electronic cigarettes.   3) Edema- notes mild bilateral swelling.    Allergies: 1)  ! Iodine  Past History:  Past Medical History: Last updated: 12/26/2008 GERD Alcoholism - past problem per patient - but admits to still drinking - unclear how much tobacco abuse hematuria - refuses work up or referral - understands risks of morbidity/mortality - 11/2008, 12/2008    Past Surgical History: Last updated: 08/11/2008 Tonsillectomy   Physical Exam  General:  Well-developed,well-nourished,in no acute distress; alert,appropriate and cooperative throughout examination Lungs:  Normal respiratory effort, chest expands symmetrically. Lungs are clear to auscultation, no crackles or wheezes. Heart:  Normal rate and regular rhythm. S1 and S2 normal without  gallop, murmur, click, rub or other extra sounds. Extremities:  trace left pedal edema and trace right pedal edema.   Psych:  Cognition and judgment appear intact. Alert and cooperative with normal attention span and concentration. No apparent delusions, illusions, hallucinations   Impression & Recommendations:  Problem # 1:  SCIATICA, RIGHT (ICD-724.3) Assessment Unchanged  Will order MRI of the LS spine and consider referral to neurosurgery.  His updated medication list for this problem includes:    Aspirin Ec 325 Mg Tbec (Aspirin) ..... One by mouth once daily    Meloxicam 7.5 Mg Tabs (Meloxicam) ..... One tablet by mouth once daily as needed for back/thigh pain  Orders: Misc. Referral (Misc. 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  Complete Medication List: 1)  Omeprazole 40 Mg Cpdr (Omeprazole) .... One cap by mouth once daily 2)  Aspirin Ec 325 Mg Tbec (Aspirin) .... One by mouth once daily 3)  Simvastatin 40 Mg Tabs (Simvastatin) .... One tablet by mouth daily 4)  Fish Oil Concentrate 1000 Mg Caps (Omega-3 fatty acids) .... Two caps by mouth two times a day 5)  Meloxicam 7.5 Mg Tabs (Meloxicam) .... One tablet by mouth once daily  as needed for back/thigh pain  Patient Instructions: 1)  You will be contacted about scheduling your MRI of your spine.   2)  Follow up in 3 months.   Prescriptions: OMEPRAZOLE 40 MG CPDR (OMEPRAZOLE) one cap by mouth once daily  #30 Each x 2   Entered and Authorized by:   Lemont Fillers FNP   Signed by:   Lemont Fillers FNP on 10/31/2010   Method used:   Electronically to        [facility] [phone]* (retail)       [address]       [place], [place]  [address]       Ph: [phone]       Fax: [phone]   RxID:   [id number]    Orders Added: 1)  Misc. Referral [Misc. Ref] 2)  Est. Patient Level III  [24401]    Current Allergies (reviewed today): ! IODINE

## 2010-12-19 ENCOUNTER — Telehealth: Payer: Self-pay | Admitting: Family

## 2010-12-26 NOTE — Progress Notes (Signed)
Summary: lipitor-generic  Phone Note Call from Patient Call back at Home Phone [phone]   Caller: Patient Call For: nurse Summary of Call: pt called and said that he is having problem with his rx for lipitor. he wants generic but [facility] says that we did not give them a generic option for lipitor. please assist. Initial call taken by: Elba Barman,  December 19, 2010 11:44 AM  Follow-up for Phone Call        We have been prescribing Simvastatin (gen. Zocor). Attempted to reach pt but voice mailbox has not been set up. Unable to leave message. Nicki Guadalajara Fergerson CMA Duncan Dull)  December 19, 2010 1:02 PM   Additional Follow-up for Phone Call Additional follow up Details #1::        Spoke to [facility], they had filled old rx for Lipitor. Doran Clay that we have been authorizing refills on Simvastatin. He verified they have been receiving and filling the Simvastatin. Pt has been notified and advised to discard any old bottles that might have Lipitor on them. Pt voices understanding. Additional Follow-up by: Mervin Kung CMA Duncan Dull),  December 19, 2010 3:36 PM

## 2011-01-27 LAB — CBC
HCT: 42.5 % (ref 39.0–52.0)
Hemoglobin: 14.7 g/dL (ref 13.0–17.0)
MCHC: 34.5 g/dL (ref 30.0–36.0)
MCV: 91.7 fL (ref 78.0–100.0)
Platelets: 290 10*3/uL (ref 150–400)
RBC: 4.64 MIL/uL (ref 4.22–5.81)
RDW: 12.4 % (ref 11.5–15.5)
WBC: 10.3 10*3/uL (ref 4.0–10.5)

## 2011-01-27 LAB — DIFFERENTIAL
Basophils Absolute: 0 10*3/uL (ref 0.0–0.1)
Basophils Relative: 0 % (ref 0–1)
Eosinophils Absolute: 0.1 10*3/uL (ref 0.0–0.7)
Eosinophils Relative: 1 % (ref 0–5)
Lymphocytes Relative: 18 % (ref 12–46)
Lymphs Abs: 1.9 10*3/uL (ref 0.7–4.0)
Monocytes Absolute: 1.5 10*3/uL — ABNORMAL HIGH (ref 0.1–1.0)
Monocytes Relative: 14 % — ABNORMAL HIGH (ref 3–12)
Neutro Abs: 6.8 10*3/uL (ref 1.7–7.7)
Neutrophils Relative %: 67 % (ref 43–77)

## 2011-01-27 LAB — URINALYSIS, ROUTINE W REFLEX MICROSCOPIC
Glucose, UA: NEGATIVE mg/dL
Ketones, ur: 40 mg/dL — AB
Leukocytes, UA: NEGATIVE
Nitrite: NEGATIVE
Protein, ur: 30 mg/dL — AB
Specific Gravity, Urine: 1.029 (ref 1.005–1.030)
Urobilinogen, UA: 1 mg/dL (ref 0.0–1.0)
pH: 5.5 (ref 5.0–8.0)

## 2011-01-27 LAB — BASIC METABOLIC PANEL
BUN: 21 mg/dL (ref 6–23)
CO2: 27 mEq/L (ref 19–32)
Calcium: 8.7 mg/dL (ref 8.4–10.5)
Chloride: 105 mEq/L (ref 96–112)
Creatinine, Ser: 0.7 mg/dL (ref 0.4–1.5)
GFR calc Af Amer: >60 mL/min (ref >60)
GFR calc non Af Amer: >60 mL/min (ref >60)
Glucose, Bld: 108 mg/dL — ABNORMAL HIGH (ref 70–99)
Potassium: 3.7 mEq/L (ref 3.5–5.1)
Sodium: 145 mEq/L (ref 135–145)

## 2011-01-27 LAB — URINE MICROSCOPIC-ADD ON

## 2011-01-27 LAB — ETHANOL: Alcohol, Ethyl (B): <5 mg/dL (ref 0–10)

## 2011-02-01 ENCOUNTER — Ambulatory Visit: Payer: Self-pay | Admitting: Family

## 2011-02-05 ENCOUNTER — Ambulatory Visit (INDEPENDENT_AMBULATORY_CARE_PROVIDER_SITE_OTHER): Payer: Medicare Other | Admitting: Family

## 2011-02-05 ENCOUNTER — Encounter: Payer: Self-pay | Admitting: Family

## 2011-02-05 DIAGNOSIS — I10 Essential (primary) hypertension: Secondary | ICD-10-CM

## 2011-02-05 DIAGNOSIS — E785 Hyperlipidemia, unspecified: Secondary | ICD-10-CM

## 2011-02-05 DIAGNOSIS — Z5181 Encounter for therapeutic drug level monitoring: Secondary | ICD-10-CM

## 2011-02-05 DIAGNOSIS — R609 Edema, unspecified: Secondary | ICD-10-CM

## 2011-02-05 LAB — HEPATIC FUNCTION PANEL
ALT: 9 U/L (ref 0–53)
AST: 13 U/L (ref 0–37)
Albumin: 4.3 g/dL (ref 3.5–5.2)
Alkaline Phosphatase: 74 U/L (ref 39–117)
Bilirubin, Direct: 0.1 mg/dL (ref 0.0–0.3)
Indirect Bilirubin: 0.2 mg/dL (ref 0.0–0.9)
Total Bilirubin: 0.3 mg/dL (ref 0.3–1.2)
Total Protein: 6.5 g/dL (ref 6.0–8.3)

## 2011-02-05 LAB — BASIC METABOLIC PANEL
BUN: 19 mg/dL (ref 6–23)
CO2: 27 mEq/L (ref 19–32)
Calcium: 9.9 mg/dL (ref 8.4–10.5)
Chloride: 104 mEq/L (ref 96–112)
Creat: 0.9 mg/dL (ref 0.40–1.50)
Glucose, Bld: 86 mg/dL (ref 70–99)
Potassium: 4.8 mEq/L (ref 3.5–5.3)
Sodium: 141 mEq/L (ref 135–145)

## 2011-02-05 MED ORDER — OMEPRAZOLE 40 MG PO CPDR: 40.0000 mg | DELAYED_RELEASE_CAPSULE | Freq: Every day | ORAL | Status: DC

## 2011-02-05 MED ORDER — SIMVASTATIN 40 MG PO TABS: 40.0000 mg | ORAL_TABLET | Freq: Every day | ORAL | Status: DC

## 2011-02-05 NOTE — Patient Instructions (Signed)
Follow up in 3 months, sooner if problems or concerns.

## 2011-02-05 NOTE — Progress Notes (Signed)
  Subjective:    Patient ID: Robert Bates, male    DOB: 1940/12/03, 70 y.o.   MRN: [id number]  HPI Mr.  Bromwell is a 70 year old male who presents today for follow up.  1)Hyperlipidemia- tolerating simvastatin without problems.   2)Edema- continues to complain of some dependent edema.  Does not wish to start a diuretic.  3) Tobacco abuse- still smoking, not motivated to quit.  "I'd leave my wife before I would quit smoking."  Review of Systems See HPI    Objective:   Physical Exam  Constitutional: He appears well-developed and well-nourished.  HENT:  Head: Normocephalic and atraumatic.  Eyes: Conjunctivae are normal. Pupils are equal, round, and reactive to light.  Neck: Normal range of motion. Neck supple.  Cardiovascular: Normal rate and regular rhythm.   Pulmonary/Chest: Effort normal and breath sounds normal.  extremities: 2+ bilateral LE edema noted.

## 2011-02-06 NOTE — Assessment & Plan Note (Signed)
On simvastatin- will continue.  Check FLP.

## 2011-02-06 NOTE — Assessment & Plan Note (Signed)
Stable, declines diuretic.  Monitor.

## 2011-02-25 ENCOUNTER — Telehealth: Payer: Self-pay | Admitting: *Deleted

## 2011-02-25 NOTE — Telephone Encounter (Signed)
Pt.notified

## 2011-02-25 NOTE — Telephone Encounter (Signed)
Received message from pt that he would like his most recent blood test results. Please advise.

## 2011-02-25 NOTE — Telephone Encounter (Signed)
Liver test and kidney test is normal.

## 2011-03-05 ENCOUNTER — Telehealth: Payer: Self-pay | Admitting: Family

## 2011-03-05 DIAGNOSIS — I714 Abdominal aortic aneurysm, without rupture: Secondary | ICD-10-CM

## 2011-03-05 NOTE — Telephone Encounter (Signed)
Pt is due for follow up of his AAA- spoke with patient re: follow up ultrasound.  He requests a Monday or Tuesday.

## 2011-03-11 ENCOUNTER — Ambulatory Visit (HOSPITAL_BASED_OUTPATIENT_CLINIC_OR_DEPARTMENT_OTHER)
Admission: RE | Admit: 2011-03-11 | Discharge: 2011-03-11 | Disposition: A | Discharge status: Home / Self Care | Payer: Medicare Other | Source: Ambulatory Visit | Attending: Family | Admitting: Family

## 2011-03-11 ENCOUNTER — Other Ambulatory Visit: Payer: Self-pay | Admitting: Family

## 2011-03-11 DIAGNOSIS — I714 Abdominal aortic aneurysm, without rupture, unspecified: Secondary | ICD-10-CM | POA: Insufficient documentation

## 2011-03-14 ENCOUNTER — Encounter: Payer: Self-pay | Admitting: Family

## 2011-03-15 ENCOUNTER — Ambulatory Visit: Payer: Medicare Other | Admitting: Family

## 2011-05-06 ENCOUNTER — Telehealth: Payer: Self-pay | Admitting: *Deleted

## 2011-05-06 ENCOUNTER — Encounter: Payer: Self-pay | Admitting: Family

## 2011-05-06 ENCOUNTER — Ambulatory Visit (INDEPENDENT_AMBULATORY_CARE_PROVIDER_SITE_OTHER): Payer: Medicare Other | Admitting: Family

## 2011-05-06 DIAGNOSIS — F17200 Nicotine dependence, unspecified, uncomplicated: Secondary | ICD-10-CM

## 2011-05-06 DIAGNOSIS — E785 Hyperlipidemia, unspecified: Secondary | ICD-10-CM

## 2011-05-06 MED ORDER — NICOTINE 21 MG/24HR TD PT24: MEDICATED_PATCH | TRANSDERMAL | Status: DC

## 2011-05-06 MED ORDER — ASPIRIN EC 81 MG PO TBEC: 81.0000 mg | DELAYED_RELEASE_TABLET | Freq: Every day | ORAL | Status: DC

## 2011-05-06 NOTE — Telephone Encounter (Signed)
Pharmacist Romeo Apple presented in office, stating patient is requesting a Rx for Nicotine Patches. He states the patient is requesting a rx , because it would be less expensive for him to get. If approved he is requesting the rx sent to [facility] in [facility]

## 2011-05-06 NOTE — Telephone Encounter (Signed)
See office note

## 2011-05-06 NOTE — Patient Instructions (Signed)
Nicotine patch (available over the counter)- 21 mg patch once daily for 6 week, then 14 mg daily x for 2 weeks, then 7 mg daily for 2weeks then stop. Stop smoking when you start the patch.  Remove patch while you sleep, replace first thing in the morning. Follow up in 3 months, come fasting to this appointment.

## 2011-05-06 NOTE — Progress Notes (Signed)
  Subjective:    Patient ID: Lonnell Chaput, male    DOB: [birth year], 70 y.o.   MRN: [id number]  HPI  Mr.  Carlberg is a 70 yr old male who presents today for follow up.    Hyperlipidemia- reports + compliance with simvastatin.  Trying to follow a low cholesterol diet, but notes that it is hard, "on the road."  Pt is a truck driver.  He reports that he stopped the fish oil due to the aftertaste.   Tobacco abuse-  Wants to quit.  He is smoking about 1 pack a day.      Review of Systems See HPI  Past Medical History  Diagnosis Date  . GERD (gastroesophageal reflux disease)   . Alcoholism     past problem per pt but admits to still drinking - unclear how much  . Tobacco abuse   . Hematuria     refuses work up or referral - understands risks of morbidity / mortality - 11/2008, 12/2008    History   Social History  . Marital Status: Married    Spouse Name: N/A    Number of Children: 2  . Years of Education: N/A   Occupational History  . DRIVER    Social History Main Topics  . Smoking status: Current Everyday Smoker -- 1.0 packs/day  . Smokeless tobacco: Not on file  . Alcohol Use: Yes     Pt reports rare use  . Drug Use: Not on file  . Sexually Active: Not on file   Other Topics Concern  . Not on file   Social History Narrative  . No narrative on file    Past Surgical History  Procedure Date  . Tonsillectomy     Family History  Problem Relation Age of Onset  . Leukemia Father   . Learning disabilities Son   . Leukemia Other   . Stroke Other     Allergies  Allergen Reactions  . Iodine     REACTION: neck swells    Current Outpatient Prescriptions on File Prior to Visit  Medication Sig Dispense Refill  . omeprazole (PRILOSEC) 40 MG capsule Take 1 capsule (40 mg total) by mouth daily.  30 capsule  3  . simvastatin (ZOCOR) 40 MG tablet Take 1 tablet (40 mg total) by mouth daily.  30 tablet  3    BP 124/80  Pulse 66  Temp(Src) 97.8 F (36.6 C) (Oral)   Resp 16  Ht 5\' 9"  (1.753 m)  Wt 170 lb 1.9 oz (77.166 kg)  BMI 25.12 kg/m2       Objective:   Physical Exam  Constitutional: He is oriented to person, place, and time. He appears well-developed and well-nourished.  HENT:  Head: Normocephalic and atraumatic.  Eyes: Conjunctivae are normal. Pupils are equal, round, and reactive to light.  Cardiovascular: Normal rate and regular rhythm.   Pulmonary/Chest: Effort normal and breath sounds normal.  Musculoskeletal: He exhibits no edema.  Neurological: He is alert and oriented to person, place, and time.  Skin: Skin is warm and dry.  Psychiatric: He has a normal mood and affect. His behavior is normal. Judgment and thought content normal.          Assessment & Plan:

## 2011-05-06 NOTE — Telephone Encounter (Signed)
Spoke to [place]- pharmacist.  Pt will be given 21 mg x 6 weeks, then 14mg  x 2 weeks, then 7mg  x 2 weeks then stop.

## 2011-05-07 ENCOUNTER — Ambulatory Visit: Payer: Medicare Other | Admitting: Family

## 2011-05-12 NOTE — Assessment & Plan Note (Signed)
15 minutes spent with the patient today.  >50% of this time was spent counseling patient on tobacco cessation.  He wishes to try the nicotine patch.

## 2011-05-12 NOTE — Assessment & Plan Note (Addendum)
Check FLP next visit. Tolerating statin.  Did not like aftertaste of fish oil- will see how he does off of the fish oil with dietary modification which was discussed today.  Pt instructed to resume the aspirin.

## 2011-06-06 ENCOUNTER — Other Ambulatory Visit: Payer: Self-pay | Admitting: Family

## 2011-06-06 NOTE — Telephone Encounter (Signed)
Refills called to pharmacy for simvastatin 40mg  1 tablet daily and omeprazole 40mg  1 tablet daily #90 x no refills.

## 2011-08-02 ENCOUNTER — Telehealth: Payer: Self-pay | Admitting: Family

## 2011-08-02 ENCOUNTER — Ambulatory Visit (INDEPENDENT_AMBULATORY_CARE_PROVIDER_SITE_OTHER): Payer: Medicare Other | Admitting: Family

## 2011-08-02 ENCOUNTER — Encounter: Payer: Self-pay | Admitting: Family

## 2011-08-02 VITALS — BP 110/78 | HR 72 | Temp 97.8°F | Resp 16 | Ht 69.0 in | Wt 166.0 lb

## 2011-08-02 DIAGNOSIS — Z23 Encounter for immunization: Secondary | ICD-10-CM

## 2011-08-02 DIAGNOSIS — E785 Hyperlipidemia, unspecified: Secondary | ICD-10-CM

## 2011-08-02 DIAGNOSIS — H6120 Impacted cerumen, unspecified ear: Secondary | ICD-10-CM

## 2011-08-02 DIAGNOSIS — F17200 Nicotine dependence, unspecified, uncomplicated: Secondary | ICD-10-CM

## 2011-08-02 LAB — HEPATIC FUNCTION PANEL
ALT: 16 U/L (ref 0–53)
AST: 17 U/L (ref 0–37)
Albumin: 4 g/dL (ref 3.5–5.2)
Alkaline Phosphatase: 74 U/L (ref 39–117)
Bilirubin, Direct: 0.1 mg/dL (ref 0.0–0.3)
Indirect Bilirubin: 0.3 mg/dL (ref 0.0–0.9)
Total Bilirubin: 0.4 mg/dL (ref 0.3–1.2)
Total Protein: 6.3 g/dL (ref 6.0–8.3)

## 2011-08-02 LAB — LIPID PANEL
Cholesterol: 164 mg/dL (ref 0–200)
HDL: 45 mg/dL (ref >39)
LDL Cholesterol: 93 mg/dL (ref 0–99)
Total CHOL/HDL Ratio: 3.6 Ratio
Triglycerides: 129 mg/dL (ref <150)
VLDL: 26 mg/dL (ref 0–40)

## 2011-08-02 MED ORDER — VARENICLINE TARTRATE 1 MG PO TABS: 1.0000 mg | ORAL_TABLET | Freq: Two times a day (BID) | ORAL | Status: DC

## 2011-08-02 MED ORDER — VARENICLINE TARTRATE 0.5 MG PO TABS: 0.5000 mg | ORAL_TABLET | Freq: Two times a day (BID) | ORAL | Status: AC

## 2011-08-02 NOTE — Progress Notes (Addendum)
  Subjective:    Patient ID: Robert Bates, male    DOB: 1941-05-03, 70 y.o.   MRN: [id number]  HPI    Review of Systems     Objective:   Physical Exam        Assessment & Plan:

## 2011-08-02 NOTE — Assessment & Plan Note (Signed)
Bilateral cerumen plugs were removed with use of curette.

## 2011-08-02 NOTE — Telephone Encounter (Signed)
Lab order printed and faxed to [facility] [phone]).

## 2011-08-02 NOTE — Assessment & Plan Note (Signed)
Pt was counseled on smoking cessation for 5 minutes.  Discussed risks/side effects associated with chantix.  A hand written rx was provided for the starter pak and printed rx for continuation months.

## 2011-08-02 NOTE — Telephone Encounter (Signed)
Please send [facility] e-req orders on patient

## 2011-08-02 NOTE — Progress Notes (Addendum)
Subjective:    Patient ID: Robert Bates, male    DOB: 1941-01-01, 70 y.o.   MRN: [id number]  HPI  Mr.  Gaillard is a 70 yr old male who presents today for follow up.  1) Hyperlipidemia- taking simvastatin- tolerating without difficulty. Denies myalgias.    2) Tobacco abuse-  Smoking 1-2 PPD  3) Reports that he has he had an ear infection-  Took some abx- feeling better.  4) vision problem- reports that he is seeing "black spots" in the left eye. Tells me he has an appt arranged this afternoon with an eye doctor in [place] for further evaluation.     Review of Systems See HPI  Past Medical History  Diagnosis Date  . GERD (gastroesophageal reflux disease)   . Alcoholism     past problem per pt but admits to still drinking - unclear how much  . Tobacco abuse   . Hematuria     refuses work up or referral - understands risks of morbidity / mortality - 11/2008, 12/2008    History   Social History  . Marital Status: Married    Spouse Name: N/A    Number of Children: 2  . Years of Education: N/A   Occupational History  . DRIVER    Social History Main Topics  . Smoking status: Current Everyday Smoker -- 1.0 packs/day  . Smokeless tobacco: Not on file  . Alcohol Use: Yes     Pt reports rare use  . Drug Use: Not on file  . Sexually Active: Not on file   Other Topics Concern  . Not on file   Social History Narrative  . No narrative on file    Past Surgical History  Procedure Date  . Tonsillectomy     Family History  Problem Relation Age of Onset  . Leukemia Father   . Learning disabilities Son   . Leukemia Other   . Stroke Other     Allergies  Allergen Reactions  . Iodine     REACTION: neck swells    Current Outpatient Prescriptions on File Prior to Visit  Medication Sig Dispense Refill  . aspirin EC 81 MG tablet Take 1 tablet (81 mg total) by mouth daily.  30 tablet  0  . omeprazole (PRILOSEC) 40 MG capsule TAKE 1 CAPSULE BY MOUTH ONCE DAILY  90  capsule  0  . simvastatin (ZOCOR) 40 MG tablet TAKE 1 TABLET BY MOUTH ONCE DAILY  90 tablet  0  . nicotine (NICODERM CQ - DOSED IN MG/24 HOURS) 21 mg/24hr patch Take as directed.  30 patch  0    BP 110/78  Pulse 72  Temp(Src) 97.8 F (36.6 C) (Oral)  Resp 16  Ht 5\' 9"  (1.753 m)  Wt 166 lb 0.6 oz (75.315 kg)  BMI 24.52 kg/m2       Objective:   Physical Exam  Constitutional: He appears well-developed and well-nourished.  HENT:  Head: Normocephalic.       Bilateral cerumen impaction removed using curette- revealing normal bilateral TM's.  Eyes: Conjunctivae are normal.       Bilateral fundoscopic exam normal.   Cardiovascular: Normal rate and regular rhythm.   No murmur heard. Pulmonary/Chest: Effort normal and breath sounds normal. No respiratory distress. He has no wheezes. He has no rales. He exhibits no tenderness.  Skin: He is not diaphoretic.  Psychiatric: He has a normal mood and affect. His behavior is normal. Judgment and thought content normal.  Assessment & Plan:

## 2011-08-02 NOTE — Assessment & Plan Note (Signed)
Tolerating statin, check FLP LFT today.

## 2011-08-02 NOTE — Patient Instructions (Addendum)
Good luck quitting smoking. Follow up in 3 months.

## 2011-08-06 ENCOUNTER — Encounter: Payer: Self-pay | Admitting: Family

## 2011-08-06 LAB — URINALYSIS, ROUTINE W REFLEX MICROSCOPIC
Bilirubin Urine: NEGATIVE
Glucose, UA: NEGATIVE
Ketones, ur: NEGATIVE
Leukocytes, UA: NEGATIVE
Nitrite: NEGATIVE
Protein, ur: NEGATIVE
Specific Gravity, Urine: 1.02
Urobilinogen, UA: 0.2
pH: 5

## 2011-08-06 LAB — URINE CULTURE
Colony Count: NO GROWTH
Culture: NO GROWTH

## 2011-08-06 LAB — URINE MICROSCOPIC-ADD ON

## 2011-08-09 ENCOUNTER — Ambulatory Visit: Payer: Medicare Other | Admitting: Family

## 2011-08-12 ENCOUNTER — Encounter: Payer: Self-pay | Admitting: Family

## 2011-08-12 ENCOUNTER — Ambulatory Visit (INDEPENDENT_AMBULATORY_CARE_PROVIDER_SITE_OTHER): Payer: Medicare Other | Admitting: Family

## 2011-08-12 VITALS — BP 100/64 | HR 72 | Temp 98.2°F | Resp 16 | Ht 69.0 in | Wt 168.0 lb

## 2011-08-12 DIAGNOSIS — J029 Acute pharyngitis, unspecified: Secondary | ICD-10-CM

## 2011-08-12 DIAGNOSIS — F17200 Nicotine dependence, unspecified, uncomplicated: Secondary | ICD-10-CM

## 2011-08-12 LAB — POCT RAPID STREP A (OFFICE): Rapid Strep A Screen: NEGATIVE

## 2011-08-12 NOTE — Patient Instructions (Signed)
You may use motrin 400mg  every 6 hours as needed for throat pain over the next few days. Continue chloraseptic spray, and gargle 2-3 x daily with salt water. Call if you are not feeling better in 2-3 days.

## 2011-08-12 NOTE — Assessment & Plan Note (Signed)
Rapid strep is negative. Plan conservative measures as outlined in after visit summary.  Pt is instructed to call if symptoms worsen or do not improve. He verbalizes understanding.

## 2011-08-12 NOTE — Assessment & Plan Note (Signed)
Pt reminded to quit smoking.  He tells me he is saving up money for chantix.

## 2011-08-12 NOTE — Progress Notes (Signed)
Subjective:    Patient ID: Robert Bates, male    DOB: 1940-12-05, 70 y.o.   MRN: [id number]  HPI  Mr.  Bates is a 70 yr old male who presents today with complaint of sore throat.  Symptoms started 1 week ago.  Denies associated cough, fever, or ear pain.  Hurts to swallow.  Has tried some aspirin anc chloraseptic spray, and gargled with mouthwash without improvement. Son had flu.      Review of Systems    see HPI  Past Medical History  Diagnosis Date  . GERD (gastroesophageal reflux disease)   . Alcoholism     past problem per pt but admits to still drinking - unclear how much  . Tobacco abuse   . Hematuria     refuses work up or referral - understands risks of morbidity / mortality - 11/2008, 12/2008    History   Social History  . Marital Status: Married    Spouse Name: N/A    Number of Children: 2  . Years of Education: N/A   Occupational History  . DRIVER    Social History Main Topics  . Smoking status: Current Everyday Smoker -- 1.0 packs/day  . Smokeless tobacco: Not on file  . Alcohol Use: Yes     Pt reports rare use  . Drug Use: Not on file  . Sexually Active: Not on file   Other Topics Concern  . Not on file   Social History Narrative  . No narrative on file    Past Surgical History  Procedure Date  . Tonsillectomy     Family History  Problem Relation Age of Onset  . Leukemia Father   . Learning disabilities Son   . Leukemia Other   . Stroke Other     Allergies  Allergen Reactions  . Iodine     REACTION: neck swells    Current Outpatient Prescriptions on File Prior to Visit  Medication Sig Dispense Refill  . aspirin EC 81 MG tablet Take 1 tablet (81 mg total) by mouth daily.  30 tablet  0  . omeprazole (PRILOSEC) 40 MG capsule TAKE 1 CAPSULE BY MOUTH ONCE DAILY  90 capsule  0  . simvastatin (ZOCOR) 40 MG tablet TAKE 1 TABLET BY MOUTH ONCE DAILY  90 tablet  0  . nicotine (NICODERM CQ - DOSED IN MG/24 HOURS) 21 mg/24hr patch Take as  directed.  30 patch  0  . varenicline (CHANTIX CONTINUING MONTH PAK) 1 MG tablet Take 1 tablet (1 mg total) by mouth 2 (two) times daily.  60 tablet  1  . varenicline (CHANTIX) 0.5 MG tablet Take 1 tablet (0.5 mg total) by mouth 2 (two) times daily.  60 tablet  0    BP 100/64  Pulse 72  Temp(Src) 98.2 F (36.8 C) (Oral)  Resp 16  Ht 5\' 9"  (1.753 m)  Wt 168 lb (76.204 kg)  BMI 24.81 kg/m2    Objective:   Physical Exam  Constitutional: He appears well-developed and well-nourished. No distress.  HENT:  Head: Normocephalic and atraumatic.  Mouth/Throat: Posterior oropharyngeal erythema present. No oropharyngeal exudate or posterior oropharyngeal edema.  Eyes: Conjunctivae are normal.  Neck: Neck supple.  Cardiovascular: Normal rate and regular rhythm.   No murmur heard. Pulmonary/Chest: Effort normal and breath sounds normal. No respiratory distress. He has no wheezes. He has no rales. He exhibits no tenderness.  Musculoskeletal: He exhibits no edema.  Lymphadenopathy:    He has no cervical adenopathy.  Psychiatric: He has a normal mood and affect. His behavior is normal. Judgment and thought content normal.          Assessment & Plan:

## 2011-08-14 ENCOUNTER — Encounter: Payer: Self-pay | Admitting: Family

## 2011-08-14 ENCOUNTER — Ambulatory Visit (INDEPENDENT_AMBULATORY_CARE_PROVIDER_SITE_OTHER): Payer: Medicare Other | Admitting: Family

## 2011-08-14 ENCOUNTER — Telehealth: Payer: Self-pay | Admitting: Family

## 2011-08-14 DIAGNOSIS — J40 Bronchitis, not specified as acute or chronic: Secondary | ICD-10-CM

## 2011-08-14 DIAGNOSIS — J209 Acute bronchitis, unspecified: Secondary | ICD-10-CM | POA: Insufficient documentation

## 2011-08-14 DIAGNOSIS — J029 Acute pharyngitis, unspecified: Secondary | ICD-10-CM

## 2011-08-14 DIAGNOSIS — R0789 Other chest pain: Secondary | ICD-10-CM | POA: Insufficient documentation

## 2011-08-14 DIAGNOSIS — R079 Chest pain, unspecified: Secondary | ICD-10-CM

## 2011-08-14 MED ORDER — MAGIC MOUTHWASH W/LIDOCAINE: 5.0000 mL | Freq: Three times a day (TID) | ORAL | Status: DC | PRN

## 2011-08-14 MED ORDER — CEFUROXIME AXETIL 500 MG PO TABS: 500.0000 mg | ORAL_TABLET | Freq: Two times a day (BID) | ORAL | Status: AC

## 2011-08-14 NOTE — Telephone Encounter (Signed)
He needs re-evaluation in the office.

## 2011-08-14 NOTE — Assessment & Plan Note (Signed)
No chest pain at present.  EKG reviewed- Sinus bradycardia without an acute ST changes.  Will refer for stress test due to multiple cardiac risk factors. Continue Aspirin.  Pt instruct to go to ED if recurrent pain.  He verbalizes understanding.

## 2011-08-14 NOTE — Telephone Encounter (Signed)
Call returned to patient at [phone], he was advised per Sandford Craze instructions. Patient scheduled for 3:15 pm today.

## 2011-08-14 NOTE — Progress Notes (Signed)
  Subjective:    Patient ID: Robert Bates, male    DOB: [birth year], 70 y.o.   MRN: [id number]  HPI  Robert Bates is a 70 yr old male who presents today with complaint of sore throat and cough. Symptoms started on 10/1.  Reports that he has thick mucous/cough.  Denies fever, myalgias, ear pain.  Vomitting last 2 mornings.  Felt that it was due to coughing.  Denies abdominal pain.  Appetite is good.   Chest pain-  He had some left sided chest pain while lying in bed x 30 minutes and resolved on its own.      Review of Systems See HPI  Past Medical History  Diagnosis Date  . GERD (gastroesophageal reflux disease)   . Alcoholism     past problem per pt but admits to still drinking - unclear how much  . Tobacco abuse   . Hematuria     refuses work up or referral - understands risks of morbidity / mortality - 11/2008, 12/2008    History   Social History  . Marital Status: Married    Spouse Name: N/A    Number of Children: 2  . Years of Education: N/A   Occupational History  . DRIVER    Social History Main Topics  . Smoking status: Current Everyday Smoker -- 1.0 packs/day  . Smokeless tobacco: Not on file  . Alcohol Use: Yes     Pt reports rare use  . Drug Use: Not on file  . Sexually Active: Not on file   Other Topics Concern  . Not on file   Social History Narrative  . No narrative on file    Past Surgical History  Procedure Date  . Tonsillectomy     Family History  Problem Relation Age of Onset  . Leukemia Father   . Learning disabilities Son   . Leukemia Other   . Stroke Other     Allergies  Allergen Reactions  . Iodine     REACTION: neck swells    Current Outpatient Prescriptions on File Prior to Visit  Medication Sig Dispense Refill  . aspirin EC 81 MG tablet Take 1 tablet (81 mg total) by mouth daily.  30 tablet  0  . omeprazole (PRILOSEC) 40 MG capsule TAKE 1 CAPSULE BY MOUTH ONCE DAILY  90 capsule  0  . simvastatin (ZOCOR) 40 MG tablet TAKE 1  TABLET BY MOUTH ONCE DAILY  90 tablet  0  . varenicline (CHANTIX) 0.5 MG tablet Take 1 tablet (0.5 mg total) by mouth 2 (two) times daily.  60 tablet  0    BP 108/72  Pulse 78  Temp(Src) 98.2 F (36.8 C) (Oral)  Resp 16  Wt 167 lb (75.751 kg)       Objective:   Physical Exam  Constitutional: He appears well-developed and well-nourished.  HENT:  Right Ear: Tympanic membrane and ear canal normal.  Left Ear: Tympanic membrane and ear canal normal.       +pharyngeal erythema no exudates.  Cardiovascular: Normal rate and regular rhythm.   No murmur heard. Pulmonary/Chest: Effort normal and breath sounds normal. No respiratory distress. He has no wheezes. He has no rales. He exhibits no tenderness.  Psychiatric: He has a normal mood and affect. His speech is normal and behavior is normal. Thought content normal. Cognition and memory are normal.          Assessment & Plan:

## 2011-08-14 NOTE — Telephone Encounter (Signed)
Patient states that he still has a sore throat and has been throwing up for the past two mornings. I offered patient appt today, but declined. Patient would like melissa to call him in something.

## 2011-08-14 NOTE — Assessment & Plan Note (Signed)
Will give magic mouthwash for comfort.  I still suspect viral etiology for his sore throat though.

## 2011-08-14 NOTE — Telephone Encounter (Signed)
Please advise 

## 2011-08-14 NOTE — Assessment & Plan Note (Signed)
Pt with worsening cough (productive).  Plan rx with ceftin.

## 2011-08-14 NOTE — Patient Instructions (Addendum)
Call if your symptoms worsen or if you are not feeling better in 2-3 days. Go to the ER if you develop chest pain again. You will be contacted about your stress test.  Let us know if Cardiology has not called you to schedule within 1 week. Follow up in 1 month. QUIT SMOKING!

## 2011-08-19 ENCOUNTER — Encounter (HOSPITAL_COMMUNITY): Payer: Medicare Other | Admitting: Radiology

## 2011-08-26 ENCOUNTER — Telehealth: Payer: Self-pay | Admitting: Family

## 2011-08-26 NOTE — Telephone Encounter (Signed)
Message copied by Sandford Craze on Mon Aug 26, 2011  4:00 PM ------      Message from: Connye Burkitt      Created: Mon Aug 19, 2011 12:07 PM      Regarding: MYOVIEW       Robert Bates has cancelled his myoview 08/19/11. Patient state he will call back when he is ready to make an appt.

## 2011-08-28 ENCOUNTER — Other Ambulatory Visit: Payer: Self-pay | Admitting: *Deleted

## 2011-08-28 MED ORDER — OMEPRAZOLE 40 MG PO CPDR: 40.0000 mg | DELAYED_RELEASE_CAPSULE | Freq: Every day | ORAL | Status: DC

## 2011-08-28 NOTE — Telephone Encounter (Signed)
Pt called requesting refill of Omeprazole. Refill sent to [facility] pharmacy #[id number] x no refills.

## 2011-09-10 ENCOUNTER — Ambulatory Visit (INDEPENDENT_AMBULATORY_CARE_PROVIDER_SITE_OTHER): Payer: Medicare Other | Admitting: Family

## 2011-09-10 ENCOUNTER — Other Ambulatory Visit: Payer: Self-pay | Admitting: Family

## 2011-09-10 ENCOUNTER — Encounter: Payer: Self-pay | Admitting: Family

## 2011-09-10 VITALS — BP 108/74 | HR 66 | Temp 97.9°F | Resp 16 | Wt 167.0 lb

## 2011-09-10 DIAGNOSIS — M79604 Pain in right leg: Secondary | ICD-10-CM

## 2011-09-10 DIAGNOSIS — M940 Chondrocostal junction syndrome [Tietze]: Secondary | ICD-10-CM

## 2011-09-10 DIAGNOSIS — R071 Chest pain on breathing: Secondary | ICD-10-CM

## 2011-09-10 DIAGNOSIS — M79609 Pain in unspecified limb: Secondary | ICD-10-CM

## 2011-09-10 DIAGNOSIS — M79606 Pain in leg, unspecified: Secondary | ICD-10-CM

## 2011-09-10 MED ORDER — SIMVASTATIN 40 MG PO TABS: 40.0000 mg | ORAL_TABLET | Freq: Every day | ORAL | Status: DC

## 2011-09-10 MED ORDER — MELOXICAM 7.5 MG PO TABS: 7.5000 mg | ORAL_TABLET | Freq: Every day | ORAL | Status: DC

## 2011-09-10 NOTE — Patient Instructions (Signed)
Please schedule your ultrasound on the first floor today. Call if your chest tenderness worsens or if it does not improve.  Follow up in 3 months.

## 2011-09-10 NOTE — Progress Notes (Signed)
  Subjective:    Patient ID: Robert Bates, male    DOB: 06-30-1941, 70 y.o.   MRN: [id number]  HPI  Mr.  Inclan is a 70 yr old male who presents today with chief complaint of tenderness of his chest wall. Tenderness started 4 days ago. Worsened by palpation.  Tried icy hot without improvement.  A hot shower relieved pain a little bit.  He did have a coughing episode on Saturday when he got "strangled on a ham sandwich."  He notes that he continues to have pain in the back of the right thigh.      Review of Systems See HPI  Past Medical History  Diagnosis Date  . GERD (gastroesophageal reflux disease)   . Alcoholism     past problem per pt but admits to still drinking - unclear how much  . Tobacco abuse   . Hematuria     refuses work up or referral - understands risks of morbidity / mortality - 11/2008, 12/2008    History   Social History  . Marital Status: Married    Spouse Name: N/A    Number of Children: 2  . Years of Education: N/A   Occupational History  . DRIVER    Social History Main Topics  . Smoking status: Current Everyday Smoker -- 1.0 packs/day  . Smokeless tobacco: Not on file  . Alcohol Use: Yes     Pt reports rare use  . Drug Use: Not on file  . Sexually Active: Not on file   Other Topics Concern  . Not on file   Social History Narrative  . No narrative on file    Past Surgical History  Procedure Date  . Tonsillectomy     Family History  Problem Relation Age of Onset  . Leukemia Father   . Learning disabilities Son   . Leukemia Other   . Stroke Other     Allergies  Allergen Reactions  . Iodine     REACTION: neck swells    Current Outpatient Prescriptions on File Prior to Visit  Medication Sig Dispense Refill  . Alum & Mag Hydroxide-Simeth (MAGIC MOUTHWASH W/LIDOCAINE) SOLN Take 5 mLs by mouth 3 (three) times daily as needed.  150 mL  0  . aspirin EC 81 MG tablet Take 1 tablet (81 mg total) by mouth daily.  30 tablet  0  . omeprazole  (PRILOSEC) 40 MG capsule Take 1 capsule (40 mg total) by mouth daily.  90 capsule  0  . varenicline (CHANTIX) 0.5 MG tablet Take 1 tablet (0.5 mg total) by mouth 2 (two) times daily.  60 tablet  0    BP 108/74  Pulse 66  Temp(Src) 97.9 F (36.6 C) (Oral)  Resp 16  Wt 167 lb 0.6 oz (75.769 kg)  SpO2 98%       Objective:   Physical Exam  Constitutional: He appears well-developed and well-nourished.  Pulmonary/Chest: He exhibits tenderness.  Musculoskeletal:       Right hip: He exhibits tenderness.       Right upper leg: He exhibits tenderness.       + tenderness R posterior leg.    + lipoma noted at base of left breast and at the base of the right breast.   Psychiatric: He has a normal mood and affect. His behavior is normal. Judgment and thought content normal.          Assessment & Plan:

## 2011-09-10 NOTE — Assessment & Plan Note (Signed)
70 yr old patient with chest wall pain.  Suspect costochondritis. Will give pt trial of Mobic.

## 2011-09-10 NOTE — Assessment & Plan Note (Signed)
Suspect that this is musculoskeletal but pt is worried about clot.  Will order LE doppler.

## 2011-09-11 NOTE — Telephone Encounter (Signed)
I refilled yesterday at his visit.

## 2011-09-11 NOTE — Telephone Encounter (Signed)
Simvastatin request denied as it was refilled 09/10/11 at office visit.

## 2011-09-13 ENCOUNTER — Telehealth: Payer: Self-pay | Admitting: *Deleted

## 2011-09-13 NOTE — Telephone Encounter (Signed)
Pt notified. Letter printed and left at registration desk in the ED for pt to pick up on Sunday.

## 2011-09-13 NOTE — Telephone Encounter (Signed)
Received call from pt that he missed work from the 6th through the 11th due to his costochondritis. Pt is requesting work excuse for these dates. Pt states pain has improved. Pt wants to pick letter up tomorrow at the ED desk if possible.  Please advise.

## 2011-09-13 NOTE — Telephone Encounter (Signed)
OK to provide excuse notes for these days please.

## 2011-09-15 ENCOUNTER — Ambulatory Visit (HOSPITAL_BASED_OUTPATIENT_CLINIC_OR_DEPARTMENT_OTHER)
Admission: RE | Admit: 2011-09-15 | Discharge: 2011-09-15 | Disposition: A | Discharge status: Home / Self Care | Payer: Medicare Other | Source: Ambulatory Visit | Attending: Family | Admitting: Family

## 2011-09-15 DIAGNOSIS — M79609 Pain in unspecified limb: Secondary | ICD-10-CM

## 2011-09-15 DIAGNOSIS — M79606 Pain in leg, unspecified: Secondary | ICD-10-CM

## 2011-09-16 ENCOUNTER — Telehealth: Payer: Self-pay | Admitting: Family

## 2011-09-16 NOTE — Telephone Encounter (Signed)
I think that it is muscle pain from driving.  I would recommend stretching, heating pad PRN, tylenol PRN.

## 2011-09-17 NOTE — Telephone Encounter (Signed)
Pt.notified

## 2011-10-04 ENCOUNTER — Ambulatory Visit: Payer: Medicare Other | Admitting: Family

## 2011-11-29 ENCOUNTER — Ambulatory Visit: Payer: Medicare Other | Admitting: Family

## 2011-12-06 ENCOUNTER — Other Ambulatory Visit: Payer: Self-pay | Admitting: Family

## 2011-12-13 ENCOUNTER — Ambulatory Visit (INDEPENDENT_AMBULATORY_CARE_PROVIDER_SITE_OTHER): Payer: Medicare Other | Admitting: Family

## 2011-12-13 ENCOUNTER — Encounter: Payer: Self-pay | Admitting: Family

## 2011-12-13 VITALS — BP 118/80 | HR 69 | Temp 97.9°F | Resp 16 | Ht 69.0 in | Wt 168.1 lb

## 2011-12-13 DIAGNOSIS — I714 Abdominal aortic aneurysm, without rupture, unspecified: Secondary | ICD-10-CM

## 2011-12-13 DIAGNOSIS — E785 Hyperlipidemia, unspecified: Secondary | ICD-10-CM

## 2011-12-13 DIAGNOSIS — M940 Chondrocostal junction syndrome [Tietze]: Secondary | ICD-10-CM

## 2011-12-13 DIAGNOSIS — L600 Ingrowing nail: Secondary | ICD-10-CM

## 2011-12-13 DIAGNOSIS — K219 Gastro-esophageal reflux disease without esophagitis: Secondary | ICD-10-CM

## 2011-12-13 MED ORDER — SIMVASTATIN 40 MG PO TABS: 40.0000 mg | ORAL_TABLET | Freq: Every day | ORAL | Status: DC

## 2011-12-13 NOTE — Assessment & Plan Note (Signed)
Stable on PPI. Continue same.  

## 2011-12-13 NOTE — Assessment & Plan Note (Signed)
Lipids are at goal. Continue statin.

## 2011-12-13 NOTE — Assessment & Plan Note (Signed)
Will plan to repeat imaging in May.  We discussed again the importance of quitting smoking.

## 2011-12-13 NOTE — Progress Notes (Signed)
Subjective:    Patient ID: General Wearing, male    DOB: 03/28/1941, 71 y.o.   MRN: [id number]  HPI  Mr.  Adkison is a 71 yr old male who presents today for follow up.    1)hyperlipidemia- continues simvastatin 40mg . Denies myalgias. Lipids last checked back in September and were at goal.  He also continues aspirin 81mg  for cardiac protection.  2) AAA- last ultrasound was in May 2012 and noted stable aneurysm at 3.2cm.   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    4) Left Great toe- reports that he is having pain.    5) GERD- well controlled as long as he is taking omeprazole.    Review of Systems  See HPI  Past Medical History  Diagnosis Date  . GERD (gastroesophageal reflux disease)   . Alcoholism     past problem per pt but admits to still drinking - unclear how much  . Tobacco abuse   . Hematuria     refuses work up or referral - understands risks of morbidity / mortality - 11/2008, 12/2008    History   Social History  . Marital Status: Married    Spouse Name: N/A    Number of Children: 2  . Years of Education: N/A   Occupational History  . DRIVER    Social History Main Topics  . Smoking status: Current Everyday Smoker -- 1.0 packs/day  . Smokeless tobacco: Not on file  . Alcohol Use: Yes     Pt reports rare use  . Drug Use: Not on file  . Sexually Active: Not on file   Other Topics Concern  . Not on file   Social History Narrative  . No narrative on file    Past Surgical History  Procedure Date  . Tonsillectomy     Family History  Problem Relation Age of Onset  . Leukemia Father   . Learning disabilities Son   . Leukemia Other   . Stroke Other     Allergies  Allergen Reactions  . Iodine     REACTION: neck swells    Current Outpatient Prescriptions on File Prior to Visit  Medication Sig Dispense Refill  . Alum & Mag Hydroxide-Simeth (MAGIC MOUTHWASH W/LIDOCAINE) SOLN Take 5 mLs by mouth 3 (three) times daily as needed.  150 mL   0  . aspirin EC 81 MG tablet Take 1 tablet (81 mg total) by mouth daily.  30 tablet  0  . omeprazole (PRILOSEC) 40 MG capsule TAKE 1 CAPSULE BY MOUTH DAILY.  90 capsule  1    BP 118/80  Pulse 69  Temp(Src) 97.9 F (36.6 C) (Oral)  Resp 16  Ht 5\' 9"  (1.753 m)  Wt 168 lb 1.3 oz (76.241 kg)  BMI 24.82 kg/m2  SpO2 95%       Objective:   Physical Exam  Constitutional: He appears well-developed and well-nourished. No distress.  HENT:  Head: Normocephalic and atraumatic.  Cardiovascular: Normal rate and regular rhythm.   No murmur heard. Pulmonary/Chest: Effort normal and breath sounds normal. No respiratory distress. He has no wheezes. He has no rales. He exhibits no tenderness.  Musculoskeletal: He exhibits no edema.  Skin:       Toenails are thick.  Left great toenail is very narrow and curved.    Psychiatric: He has a normal mood and affect. His behavior is normal. Judgment and thought content normal.          Assessment & Plan:

## 2011-12-13 NOTE — Patient Instructions (Signed)
Please follow up in 3 months.  You will be contacted about your referral to the foot doctor.

## 2011-12-13 NOTE — Assessment & Plan Note (Signed)
Resolved

## 2011-12-13 NOTE — Assessment & Plan Note (Signed)
Will plan to refer to podiatry. He is instructed to call if surrounding skin becomes red/hot/ or swollen.

## 2012-02-04 ENCOUNTER — Telehealth: Payer: Self-pay | Admitting: *Deleted

## 2012-02-04 MED ORDER — MELOXICAM 7.5 MG PO TABS: 7.5000 mg | ORAL_TABLET | Freq: Every day | ORAL | Status: DC

## 2012-02-04 NOTE — Telephone Encounter (Signed)
Notified pt. 

## 2012-02-04 NOTE — Telephone Encounter (Signed)
Received call from pt stating he is having back pain in the middle of his back again x 3 weeks. Thinks he took Meloxicam for this in the past and wants to know if he can get a refill or if we can prescribe something to help his pain? Please advise.

## 2012-02-04 NOTE — Telephone Encounter (Signed)
Rx sent to pharmacy for meloxicam.  He should be seen in office if his symptoms worsen, or if no improvement in 1-2 weeks.

## 2012-03-13 ENCOUNTER — Ambulatory Visit: Payer: Medicare Other | Admitting: Family

## 2012-03-20 ENCOUNTER — Ambulatory Visit (INDEPENDENT_AMBULATORY_CARE_PROVIDER_SITE_OTHER): Payer: Medicare Other | Admitting: Family

## 2012-03-20 ENCOUNTER — Encounter: Payer: Self-pay | Admitting: Family

## 2012-03-20 VITALS — BP 125/83 | HR 67 | Temp 97.4°F | Resp 16 | Ht 69.0 in | Wt 168.1 lb

## 2012-03-20 DIAGNOSIS — E785 Hyperlipidemia, unspecified: Secondary | ICD-10-CM

## 2012-03-20 DIAGNOSIS — I714 Abdominal aortic aneurysm, without rupture, unspecified: Secondary | ICD-10-CM

## 2012-03-20 LAB — HEPATIC FUNCTION PANEL
ALT: 9 U/L (ref 0–53)
AST: 12 U/L (ref 0–37)
Albumin: 4 g/dL (ref 3.5–5.2)
Alkaline Phosphatase: 76 U/L (ref 39–117)
Bilirubin, Direct: 0.1 mg/dL (ref 0.0–0.3)
Indirect Bilirubin: 0.2 mg/dL (ref 0.0–0.9)
Total Bilirubin: 0.3 mg/dL (ref 0.3–1.2)
Total Protein: 6.1 g/dL (ref 6.0–8.3)

## 2012-03-20 MED ORDER — SIMVASTATIN 40 MG PO TABS: 40.0000 mg | ORAL_TABLET | Freq: Every day | ORAL | Status: DC

## 2012-03-20 MED ORDER — MELOXICAM 7.5 MG PO TABS: 7.5000 mg | ORAL_TABLET | Freq: Every day | ORAL | Status: DC

## 2012-03-20 NOTE — Assessment & Plan Note (Signed)
Will check follow up ultrasound.

## 2012-03-20 NOTE — Assessment & Plan Note (Signed)
Lipids have been stable. Check routine LFT. Continue statin.

## 2012-03-20 NOTE — Progress Notes (Signed)
  Subjective:    Patient ID: Robert Bates, male    DOB: 03-Aug-1941, 71 y.o.   MRN: [id number]  HPI  Ms.  Robert Bates is a 71 yr old male who presents today for follow up.  Hyperlipidemia- tolerating statin without difficulty.  AAA- was noted to have 3.2 cm AAA last may on ultrasound.  Due for follow up AAA.    Review of Systems See HPI  Past Medical History  Diagnosis Date  . GERD (gastroesophageal reflux disease)   . Alcoholism     past problem per pt but admits to still drinking - unclear how much  . Tobacco abuse   . Hematuria     refuses work up or referral - understands risks of morbidity / mortality - 11/2008, 12/2008    History   Social History  . Marital Status: Married    Spouse Name: N/A    Number of Children: 2  . Years of Education: N/A   Occupational History  . DRIVER    Social History Main Topics  . Smoking status: Current Everyday Smoker -- 1.0 packs/day  . Smokeless tobacco: Not on file  . Alcohol Use: Yes     Pt reports rare use  . Drug Use: Not on file  . Sexually Active: Not on file   Other Topics Concern  . Not on file   Social History Narrative  . No narrative on file    Past Surgical History  Procedure Date  . Tonsillectomy     Family History  Problem Relation Age of Onset  . Leukemia Father   . Learning disabilities Son   . Leukemia Other   . Stroke Other     Allergies  Allergen Reactions  . Iodine     REACTION: neck swells    Current Outpatient Prescriptions on File Prior to Visit  Medication Sig Dispense Refill  . aspirin EC 81 MG tablet Take 1 tablet (81 mg total) by mouth daily.  30 tablet  0  . omeprazole (PRILOSEC) 40 MG capsule TAKE 1 CAPSULE BY MOUTH DAILY.  90 capsule  1  . DISCONTD: simvastatin (ZOCOR) 40 MG tablet Take 1 tablet (40 mg total) by mouth at bedtime.  90 tablet  1  . Alum & Mag Hydroxide-Simeth (MAGIC MOUTHWASH W/LIDOCAINE) SOLN Take 5 mLs by mouth 3 (three) times daily as needed.  150 mL  0    BP  125/83  Pulse 67  Temp(Src) 97.4 F (36.3 C) (Oral)  Resp 16  Ht 5\' 9"  (1.753 m)  Wt 168 lb 1.3 oz (76.241 kg)  BMI 24.82 kg/m2  SpO2 97%       Objective:   Physical Exam  Constitutional: He appears well-developed and well-nourished. No distress.  Cardiovascular: Normal rate and regular rhythm.   No murmur heard. Pulmonary/Chest: Effort normal and breath sounds normal. No respiratory distress. He has no wheezes. He has no rales. He exhibits no tenderness.  Musculoskeletal: He exhibits no edema.  Psychiatric: He has a normal mood and affect. His behavior is normal. Judgment and thought content normal.          Assessment & Plan:

## 2012-03-20 NOTE — Patient Instructions (Signed)
Please complete your lab work. You will be contact about your referral for your abdominal ultrasound. Please let us know if you have not heard back within 1 week about your referral. Follow up in 6 months.

## 2012-03-22 ENCOUNTER — Encounter: Payer: Self-pay | Admitting: Family

## 2012-03-23 ENCOUNTER — Ambulatory Visit (HOSPITAL_BASED_OUTPATIENT_CLINIC_OR_DEPARTMENT_OTHER)
Admission: RE | Admit: 2012-03-23 | Discharge: 2012-03-23 | Disposition: A | Discharge status: Home / Self Care | Payer: Medicare Other | Source: Ambulatory Visit | Attending: Family | Admitting: Family

## 2012-03-23 DIAGNOSIS — I714 Abdominal aortic aneurysm, without rupture, unspecified: Secondary | ICD-10-CM | POA: Insufficient documentation

## 2012-05-06 ENCOUNTER — Other Ambulatory Visit: Payer: Self-pay | Admitting: Family

## 2012-05-06 ENCOUNTER — Encounter: Payer: Self-pay | Admitting: Family

## 2012-05-06 ENCOUNTER — Ambulatory Visit (INDEPENDENT_AMBULATORY_CARE_PROVIDER_SITE_OTHER): Payer: Medicare Other | Admitting: Family

## 2012-05-06 ENCOUNTER — Ambulatory Visit (HOSPITAL_BASED_OUTPATIENT_CLINIC_OR_DEPARTMENT_OTHER)
Admission: RE | Admit: 2012-05-06 | Discharge: 2012-05-06 | Disposition: A | Discharge status: Home / Self Care | Payer: Medicare Other | Source: Ambulatory Visit | Attending: Family | Admitting: Family

## 2012-05-06 VITALS — BP 116/80 | HR 66 | Temp 97.8°F | Resp 16 | Ht 69.0 in | Wt 168.1 lb

## 2012-05-06 DIAGNOSIS — N200 Calculus of kidney: Secondary | ICD-10-CM | POA: Insufficient documentation

## 2012-05-06 DIAGNOSIS — R109 Unspecified abdominal pain: Secondary | ICD-10-CM

## 2012-05-06 DIAGNOSIS — R319 Hematuria, unspecified: Secondary | ICD-10-CM

## 2012-05-06 DIAGNOSIS — N201 Calculus of ureter: Secondary | ICD-10-CM | POA: Insufficient documentation

## 2012-05-06 DIAGNOSIS — D3500 Benign neoplasm of unspecified adrenal gland: Secondary | ICD-10-CM | POA: Insufficient documentation

## 2012-05-06 DIAGNOSIS — I722 Aneurysm of renal artery: Secondary | ICD-10-CM | POA: Insufficient documentation

## 2012-05-06 DIAGNOSIS — K449 Diaphragmatic hernia without obstruction or gangrene: Secondary | ICD-10-CM | POA: Insufficient documentation

## 2012-05-06 LAB — POCT URINALYSIS DIPSTICK
Bilirubin, UA: NEGATIVE
Glucose, UA: NEGATIVE
Ketones, UA: NEGATIVE
Leukocytes, UA: NEGATIVE
Nitrite, UA: NEGATIVE
Protein, UA: NEGATIVE
Spec Grav, UA: <=1.005
Urobilinogen, UA: 0.2
pH, UA: 6

## 2012-05-06 MED ORDER — TRAMADOL HCL 50 MG PO TABS: ORAL_TABLET | ORAL | Status: DC

## 2012-05-06 MED ORDER — TAMSULOSIN HCL 0.4 MG PO CAPS: 0.4000 mg | ORAL_CAPSULE | Freq: Every day | ORAL | Status: DC

## 2012-05-06 MED ORDER — CIPROFLOXACIN HCL 500 MG PO TABS: 500.0000 mg | ORAL_TABLET | Freq: Two times a day (BID) | ORAL | Status: AC

## 2012-05-06 NOTE — Telephone Encounter (Signed)
Pls call pt and let him know that his CT shows kidney stone on left.  I would like him to see urology. Start flomax to help him pass stone.  Cipro for possible kidney infection. Also, tramadol prn- do not drive after taking.  Call if fever over 101, worsening pain, or if not feeling better in 2-3 days.

## 2012-05-06 NOTE — Patient Instructions (Addendum)
Please complete your CT on the first floor.  

## 2012-05-06 NOTE — Telephone Encounter (Signed)
Attempted to reach pt at home #, received fast busy tone. No other numbers in system. Will try again later. Notified pt and he voices understanding.

## 2012-05-06 NOTE — Progress Notes (Signed)
  Subjective:    Patient ID: Robert Bates, male    DOB: 08-01-1941, 71 y.o.   MRN: [id number]  HPI  Mr.  Jagoda is a 71 yr old male who presents today with chief complaint of left sided nausea and abdominal pain.  Symptoms started 4 days ago.  Describes pain as aching.  Today feels better than today.  He has not eaten since yesterday morning due to nausea.  +sweating yesterday.  Denies associated fever.  Denies dysuria or frequency.  Reports normal BM this morning.  Normal, no blood in stool.      Review of Systems    see HPI  Past Medical History  Diagnosis Date  . GERD (gastroesophageal reflux disease)   . Alcoholism     past problem per pt but admits to still drinking - unclear how much  . Tobacco abuse   . Hematuria     refuses work up or referral - understands risks of morbidity / mortality - 11/2008, 12/2008    History   Social History  . Marital Status: Married    Spouse Name: N/A    Number of Children: 2  . Years of Education: N/A   Occupational History  . DRIVER    Social History Main Topics  . Smoking status: Current Everyday Smoker -- 1.0 packs/day  . Smokeless tobacco: Not on file  . Alcohol Use: Yes     Pt reports rare use  . Drug Use: Not on file  . Sexually Active: Not on file   Other Topics Concern  . Not on file   Social History Narrative  . No narrative on file    Past Surgical History  Procedure Date  . Tonsillectomy     Family History  Problem Relation Age of Onset  . Leukemia Father   . Learning disabilities Son   . Leukemia Other   . Stroke Other     Allergies  Allergen Reactions  . Iodine     REACTION: neck swells    Current Outpatient Prescriptions on File Prior to Visit  Medication Sig Dispense Refill  . Alum & Mag Hydroxide-Simeth (MAGIC MOUTHWASH W/LIDOCAINE) SOLN Take 5 mLs by mouth 3 (three) times daily as needed.  150 mL  0  . meloxicam (MOBIC) 7.5 MG tablet Take 1 tablet (7.5 mg total) by mouth daily.  30 tablet  2  .  omeprazole (PRILOSEC) 40 MG capsule TAKE 1 CAPSULE BY MOUTH DAILY.  90 capsule  1  . simvastatin (ZOCOR) 40 MG tablet Take 1 tablet (40 mg total) by mouth at bedtime.  90 tablet  1    BP 116/80  Pulse 66  Temp 97.8 F (36.6 C) (Oral)  Resp 16  Ht 5\' 9"  (1.753 m)  Wt 168 lb 1.9 oz (76.259 kg)  BMI 24.83 kg/m2  SpO2 97%    Objective:   Physical Exam  Constitutional: He appears well-developed and well-nourished. No distress.  Cardiovascular: Normal rate and regular rhythm.   No murmur heard. Pulmonary/Chest: Effort normal and breath sounds normal. No respiratory distress. He has no wheezes. He has no rales. He exhibits no tenderness.  Abdominal: Soft. Bowel sounds are normal.       + LLQ tenderness  Genitourinary:       + L CVAT is noted.  Psychiatric: He has a normal mood and affect. His behavior is normal. Judgment and thought content normal.          Assessment & Plan:

## 2012-05-06 NOTE — Assessment & Plan Note (Signed)
Due to hematuria and cvat a CT was performed which notes:  1. Mildly obstructing mid left ureteral stone.  2. Nonobstructing punctate left renal stones.  3. Moderate hiatal hernia.  4. Small infrarenal aortic aneurysm, stable.  5. Small left adrenal adenoma, stable.   Plan rx with flomax to help dilate ureter and pass stone. Refer to urology for further evaluation. Add tramadol prn pain. Start cipro.

## 2012-05-08 ENCOUNTER — Encounter: Payer: Self-pay | Admitting: *Deleted

## 2012-05-08 LAB — URINE CULTURE
Colony Count: NO GROWTH
Organism ID, Bacteria: NO GROWTH

## 2012-05-20 ENCOUNTER — Encounter: Payer: Self-pay | Admitting: Family

## 2012-05-20 ENCOUNTER — Ambulatory Visit (INDEPENDENT_AMBULATORY_CARE_PROVIDER_SITE_OTHER): Payer: Medicare Other | Admitting: Family

## 2012-05-20 VITALS — BP 112/82 | HR 65 | Temp 97.6°F | Resp 16 | Wt 166.1 lb

## 2012-05-20 DIAGNOSIS — R079 Chest pain, unspecified: Secondary | ICD-10-CM | POA: Insufficient documentation

## 2012-05-20 NOTE — Assessment & Plan Note (Signed)
Chest pain is resolved.  Requested ED records.  EKG performed today in the office shows sinus bradycardia.  Not clear what his discharge diagnosis was- ?pericarditis.  Await records.  Told pt that once I review these records I can release him back to work.

## 2012-05-20 NOTE — Progress Notes (Signed)
Subjective:    Patient ID: Robert Bates, male    DOB: 12/14/1940, 71 y.o.   MRN: [id number]  HPI  Robert Bates is a 71 yr old male who presents today for ED follow up.  He was seen in the [place] ED on 7/12 due to chest pains and shortness of breath.  Stayed overnight.  Was told that he had "infection around the heart."  He was given rx for metoprolol.  He saw a "heart specialist" and was told to follow up with him in 1 month.  (Dr. Willeen Cass).  He has not had cp/SOB since returning home.  Mild LE edema.  Had some mild nausea on Sunday. None now.     Review of Systems See HPI  Past Medical History  Diagnosis Date  . GERD (gastroesophageal reflux disease)   . Alcoholism     past problem per pt but admits to still drinking - unclear how much  . Tobacco abuse   . Hematuria     refuses work up or referral - understands risks of morbidity / mortality - 11/2008, 12/2008    History   Social History  . Marital Status: Married    Spouse Name: N/A    Number of Children: 2  . Years of Education: N/A   Occupational History  . DRIVER    Social History Main Topics  . Smoking status: Current Everyday Smoker -- 1.0 packs/day  . Smokeless tobacco: Not on file  . Alcohol Use: Yes     Pt reports rare use  . Drug Use: Not on file  . Sexually Active: Not on file   Other Topics Concern  . Not on file   Social History Narrative  . No narrative on file    Past Surgical History  Procedure Date  . Tonsillectomy     Family History  Problem Relation Age of Onset  . Leukemia Father   . Learning disabilities Son   . Leukemia Other   . Stroke Other     Allergies  Allergen Reactions  . Iodine     REACTION: neck swells    Current Outpatient Prescriptions on File Prior to Visit  Medication Sig Dispense Refill  . Alum & Mag Hydroxide-Simeth (MAGIC MOUTHWASH W/LIDOCAINE) SOLN Take 5 mLs by mouth 3 (three) times daily as needed.  150 mL  0  . aspirin EC 81 MG tablet Take 81 mg by mouth  daily.      . meloxicam (MOBIC) 7.5 MG tablet Take 1 tablet (7.5 mg total) by mouth daily.  30 tablet  2  . metoprolol tartrate (LOPRESSOR) 25 MG tablet Take 25 mg by mouth 2 (two) times daily.      . pantoprazole (PROTONIX) 40 MG tablet Take 40 mg by mouth daily.      . simvastatin (ZOCOR) 40 MG tablet Take 1 tablet (40 mg total) by mouth at bedtime.  90 tablet  1  . Tamsulosin HCl (FLOMAX) 0.4 MG CAPS Take 1 capsule (0.4 mg total) by mouth daily.  7 capsule  0  . traMADol (ULTRAM) 50 MG tablet One tablet every 8 hours as needed for pain.  May cause drowsiness- do not drive after taking this medicine.  20 tablet  0  . omeprazole (PRILOSEC) 40 MG capsule TAKE 1 CAPSULE BY MOUTH DAILY.  90 capsule  1    BP 112/82  Pulse 65  Temp 97.6 F (36.4 C) (Oral)  Resp 16  Wt 166 lb 1.9 oz (75.352  kg)  SpO2 97%       Objective:   Physical Exam  Constitutional: He appears well-developed and well-nourished. No distress.  Cardiovascular: Normal rate and regular rhythm.   No murmur heard. Pulmonary/Chest: Effort normal and breath sounds normal. No respiratory distress. He has no wheezes. He has no rales. He exhibits no tenderness.  Musculoskeletal: He exhibits no edema.  Skin: Skin is warm and dry.  Psychiatric: He has a normal mood and affect. His behavior is normal. Judgment and thought content normal.          Assessment & Plan:

## 2012-05-20 NOTE — Patient Instructions (Addendum)
Please schedule a follow up appointment in 3 months.

## 2012-05-25 ENCOUNTER — Telehealth: Payer: Self-pay | Admitting: *Deleted

## 2012-05-25 ENCOUNTER — Encounter: Payer: Self-pay | Admitting: Family

## 2012-05-25 NOTE — Telephone Encounter (Signed)
Records should be in your folder. Are we missing any information?

## 2012-05-25 NOTE — Telephone Encounter (Signed)
Pt called wanting to know if we have received records from [facility] re: his ER visit. Pt states we were awaiting these records to determine when he could return to work.  Please advise.

## 2012-05-25 NOTE — Telephone Encounter (Signed)
Would you pls call [place] and check status of records release? Thanks.

## 2012-05-25 NOTE — Telephone Encounter (Signed)
Pls let pt know that he is cleared to return to work on 8/1.  He will need a follow up CT of his chest in 3 months to evaluate a possible lung nodule noted while in ED.

## 2012-05-25 NOTE — Telephone Encounter (Signed)
Notified pt, letter placed at front desk for pick up. 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

## 2012-08-17 ENCOUNTER — Ambulatory Visit: Payer: Medicare Other | Admitting: Family

## 2012-08-18 ENCOUNTER — Encounter: Payer: Self-pay | Admitting: Family

## 2012-08-18 ENCOUNTER — Ambulatory Visit (INDEPENDENT_AMBULATORY_CARE_PROVIDER_SITE_OTHER): Payer: Medicare Other | Admitting: Family

## 2012-08-18 VITALS — BP 114/74 | HR 64 | Temp 97.4°F | Resp 16 | Ht 69.0 in | Wt 169.0 lb

## 2012-08-18 DIAGNOSIS — I10 Essential (primary) hypertension: Secondary | ICD-10-CM

## 2012-08-18 DIAGNOSIS — R9389 Abnormal findings on diagnostic imaging of other specified body structures: Secondary | ICD-10-CM

## 2012-08-18 DIAGNOSIS — Z23 Encounter for immunization: Secondary | ICD-10-CM

## 2012-08-18 DIAGNOSIS — H6120 Impacted cerumen, unspecified ear: Secondary | ICD-10-CM

## 2012-08-18 DIAGNOSIS — E785 Hyperlipidemia, unspecified: Secondary | ICD-10-CM

## 2012-08-18 DIAGNOSIS — M1990 Unspecified osteoarthritis, unspecified site: Secondary | ICD-10-CM

## 2012-08-18 DIAGNOSIS — R079 Chest pain, unspecified: Secondary | ICD-10-CM

## 2012-08-18 MED ORDER — TRAMADOL HCL 50 MG PO TABS: ORAL_TABLET | ORAL | Status: DC

## 2012-08-18 NOTE — Assessment & Plan Note (Signed)
Repeat follow up chest CT.

## 2012-08-18 NOTE — Assessment & Plan Note (Deleted)
Pt reported ear fullness resolved after removal of cerumen impaction.  

## 2012-08-18 NOTE — Progress Notes (Signed)
Subjective:    Patient ID: Robert Bates, male    DOB: 25-Nov-1940, 71 y.o.   MRN: [id number]  HPI  Robert Bates is a 71 yr old male who presents today for follow up.  1)Abnormal Chest CT- had CT chest performed in [place] back in July.  Note was made of a 15mm ground glass opacity.  Recommended 3 month follow up study.  2) Ear problem- feels like water in the right ear.  3) R elbow pain/ R hip pain- notes pain in the right elbow/shoulder.  Some pain in the right buttock/hip when he presses right foot on the accelerator.   Review of Systems See HPI  Past Medical History  Diagnosis Date  . GERD (gastroesophageal reflux disease)   . Alcoholism     past problem per pt but admits to still drinking - unclear how much  . Tobacco abuse   . Hematuria     refuses work up or referral - understands risks of morbidity / mortality - 11/2008, 12/2008    History   Social History  . Marital Status: Married    Spouse Name: N/A    Number of Children: 2  . Years of Education: N/A   Occupational History  . DRIVER    Social History Main Topics  . Smoking status: Current Every Day Smoker -- 1.0 packs/day  . Smokeless tobacco: Not on file  . Alcohol Use: Yes     Pt reports rare use  . Drug Use: Not on file  . Sexually Active: Not on file   Other Topics Concern  . Not on file   Social History Narrative  . No narrative on file    Past Surgical History  Procedure Date  . Tonsillectomy     Family History  Problem Relation Age of Onset  . Leukemia Father   . Learning disabilities Son   . Leukemia Other   . Stroke Other     Allergies  Allergen Reactions  . Iodine     REACTION: neck swells    Current Outpatient Prescriptions on File Prior to Visit  Medication Sig Dispense Refill  . Alum & Mag Hydroxide-Simeth (MAGIC MOUTHWASH W/LIDOCAINE) SOLN Take 5 mLs by mouth 3 (three) times daily as needed.  150 mL  0  . aspirin EC 81 MG tablet Take 81 mg by mouth daily.      [name]  ibuprofen (ADVIL,MOTRIN) 400 MG tablet Take 400 mg by mouth 3 (three) times daily as needed.      . meloxicam (MOBIC) 7.5 MG tablet Take 1 tablet (7.5 mg total) by mouth daily.  30 tablet  2  . metoprolol tartrate (LOPRESSOR) 25 MG tablet Take 25 mg by mouth 2 (two) times daily.      [name] omeprazole (PRILOSEC) 40 MG capsule TAKE 1 CAPSULE BY MOUTH DAILY.  90 capsule  1  . pantoprazole (PROTONIX) 40 MG tablet Take 40 mg by mouth daily.      . simvastatin (ZOCOR) 40 MG tablet Take 1 tablet (40 mg total) by mouth at bedtime.  90 tablet  1  . Tamsulosin HCl (FLOMAX) 0.4 MG CAPS Take 1 capsule (0.4 mg total) by mouth daily.  7 capsule  0    BP 114/74  Pulse 64  Temp 97.4 F (36.3 C) (Oral)  Resp 16  Ht 5\' 9"  (1.753 m)  Wt 169 lb (76.658 kg)  BMI 24.96 kg/m2  SpO2 96%       Objective:   Physical Exam  Constitutional: He appears well-developed and well-nourished. No distress.  Cardiovascular: Normal rate and regular rhythm.   No murmur heard. Pulmonary/Chest: Effort normal and breath sounds normal. No respiratory distress. He has no wheezes. He has no rales. He exhibits no tenderness.  Musculoskeletal:       Full rom of the right shoulder and right elbow.   Ears: bilateral cerumen impaction.  Cerumen plug removed bilaterally with curette revealing normal TM's bilaterally.         Assessment & Plan:

## 2012-08-18 NOTE — Assessment & Plan Note (Signed)
Pt reported ear fullness resolved after removal of cerumen impaction.

## 2012-08-18 NOTE — Assessment & Plan Note (Signed)
Recommended tylenol for pain, tramadol for severe pain.  He is aware not to drive after taking.  Discussed referral to ortho- but he declines.

## 2012-08-18 NOTE — Patient Instructions (Addendum)
You will be contacted about your chest CT.   Try tylenol first for pain, if no improvement, you may use tramadol, but do not take before driving.  Please schedule a follow up appointment in 3 months. Come fasting to this appointment.  Please follow up in 6 months, sooner if problems or concerns.

## 2012-08-20 ENCOUNTER — Ambulatory Visit (HOSPITAL_BASED_OUTPATIENT_CLINIC_OR_DEPARTMENT_OTHER): Payer: Medicare Other

## 2012-08-21 ENCOUNTER — Ambulatory Visit (HOSPITAL_BASED_OUTPATIENT_CLINIC_OR_DEPARTMENT_OTHER): Admission: RE | Admit: 2012-08-21 | Payer: Medicare Other | Source: Ambulatory Visit

## 2012-08-24 ENCOUNTER — Telehealth: Payer: Self-pay | Admitting: Family

## 2012-08-24 NOTE — Telephone Encounter (Signed)
pls call pt and remind him to return for bmet and lft ordered on 10/15

## 2012-08-31 NOTE — Telephone Encounter (Signed)
Attempted to reach pt, unable to leave message. Mailed reminder letter to complete bloodwork.

## 2012-10-19 ENCOUNTER — Other Ambulatory Visit: Payer: Self-pay | Admitting: Family

## 2012-10-20 NOTE — Telephone Encounter (Signed)
Rx to pharmacy/SLS 

## 2012-10-26 ENCOUNTER — Telehealth: Payer: Self-pay | Admitting: Family

## 2012-10-26 DIAGNOSIS — R9389 Abnormal findings on diagnostic imaging of other specified body structures: Secondary | ICD-10-CM

## 2012-10-26 NOTE — Telephone Encounter (Signed)
Attempt to reach pt via telephone, message " welcome to [employer], the customer you have attempted to reach is unavailable at this time, please try your call again later"/SLS 12.23.13 2:13p

## 2012-10-26 NOTE — Telephone Encounter (Signed)
Pls call pt and let him know that I have reviewed his chart and do not see that he completed follow up CT chest ordered back in October. This is to follow up on an abnormality noted on CT at the ED in [place].  I would like to reschedule this please (pended below).

## 2012-10-27 NOTE — Telephone Encounter (Signed)
Attempted to reach pt and received message that [employer] customer is not available. Mailed letter.

## 2012-11-20 ENCOUNTER — Ambulatory Visit: Payer: Medicare Other | Admitting: Family

## 2012-11-20 ENCOUNTER — Ambulatory Visit (HOSPITAL_BASED_OUTPATIENT_CLINIC_OR_DEPARTMENT_OTHER): Admission: RE | Admit: 2012-11-20 | Payer: Medicare Other | Source: Ambulatory Visit

## 2012-12-01 ENCOUNTER — Encounter: Payer: Self-pay | Admitting: Gastroenterology

## 2013-01-21 ENCOUNTER — Ambulatory Visit (INDEPENDENT_AMBULATORY_CARE_PROVIDER_SITE_OTHER): Payer: Medicare Other | Admitting: Family

## 2013-01-21 ENCOUNTER — Ambulatory Visit (HOSPITAL_BASED_OUTPATIENT_CLINIC_OR_DEPARTMENT_OTHER)
Admission: RE | Admit: 2013-01-21 | Discharge: 2013-01-21 | Disposition: A | Discharge status: Home / Self Care | Payer: Medicare Other | Source: Ambulatory Visit | Attending: Family | Admitting: Family

## 2013-01-21 ENCOUNTER — Encounter: Payer: Self-pay | Admitting: Family

## 2013-01-21 VITALS — BP 130/82 | HR 56 | Temp 97.6°F | Resp 16 | Ht 69.0 in | Wt 169.0 lb

## 2013-01-21 DIAGNOSIS — N201 Calculus of ureter: Secondary | ICD-10-CM | POA: Insufficient documentation

## 2013-01-21 DIAGNOSIS — R109 Unspecified abdominal pain: Secondary | ICD-10-CM

## 2013-01-21 DIAGNOSIS — K8020 Calculus of gallbladder without cholecystitis without obstruction: Secondary | ICD-10-CM | POA: Insufficient documentation

## 2013-01-21 DIAGNOSIS — R9389 Abnormal findings on diagnostic imaging of other specified body structures: Secondary | ICD-10-CM

## 2013-01-21 LAB — BASIC METABOLIC PANEL
BUN: 16 mg/dL (ref 6–23)
CO2: 29 mEq/L (ref 19–32)
Calcium: 9.8 mg/dL (ref 8.4–10.5)
Chloride: 104 mEq/L (ref 96–112)
Creat: 0.75 mg/dL (ref 0.50–1.35)
Glucose, Bld: 93 mg/dL (ref 70–99)
Potassium: 4.8 mEq/L (ref 3.5–5.3)
Sodium: 140 mEq/L (ref 135–145)

## 2013-01-21 LAB — POCT URINALYSIS DIPSTICK
Bilirubin, UA: NEGATIVE
Glucose, UA: NEGATIVE
Ketones, UA: NEGATIVE
Leukocytes, UA: NEGATIVE
Nitrite, UA: NEGATIVE
Protein, UA: NEGATIVE
Spec Grav, UA: 1.015
Urobilinogen, UA: 0.2
pH, UA: 6.5

## 2013-01-21 MED ORDER — METHYLPREDNISOLONE 4 MG PO KIT: PACK | ORAL | Status: DC

## 2013-01-21 MED ORDER — TRAMADOL HCL 50 MG PO TABS: ORAL_TABLET | ORAL | Status: DC

## 2013-01-21 MED ORDER — METOPROLOL TARTRATE 25 MG PO TABS: 25.0000 mg | ORAL_TABLET | Freq: Two times a day (BID) | ORAL | Status: DC

## 2013-01-21 MED ORDER — SIMVASTATIN 40 MG PO TABS: 40.0000 mg | ORAL_TABLET | Freq: Every day | ORAL | Status: DC

## 2013-01-21 MED ORDER — OMEPRAZOLE 40 MG PO CPDR: DELAYED_RELEASE_CAPSULE | ORAL | Status: DC

## 2013-01-21 NOTE — Progress Notes (Signed)
  Subjective:    Patient ID: Robert Bates, male    DOB: 1941-09-07, 72 y.o.   MRN: [id number]  HPI    Review of Systems     Objective:   Physical Exam        Assessment & Plan:  Addendum, CT neg for R sided stone.  Will rx with medrol dose pak.

## 2013-01-21 NOTE — Progress Notes (Signed)
Subjective:    Patient ID: Robert Bates, male    DOB: [birth year], 72 y.o.   MRN: [id number]  HPI  Robert Bates is a 72 yr old male who presents today with chief complaint of right sided flank pain. Pain started 3 weeks ago. Pain is worsening.  He has tried "whatever pain pills I can find." Pain is described as a "dull hurt." Pain is constant. Denies fever, nausea/vomitting, dysuria, or hematuria. He has hx of kidney stones.     Review of Systems    see HPI  Past Medical History  Diagnosis Date  . GERD (gastroesophageal reflux disease)   . Alcoholism     past problem per pt but admits to still drinking - unclear how much  . Tobacco abuse   . Hematuria     refuses work up or referral - understands risks of morbidity / mortality - 11/2008, 12/2008    History   Social History  . Marital Status: Married    Spouse Name: N/A    Number of Children: 2  . Years of Education: N/A   Occupational History  . DRIVER    Social History Main Topics  . Smoking status: Current Every Day Smoker -- 1.00 packs/day  . Smokeless tobacco: Not on file  . Alcohol Use: Yes     Comment: Pt reports rare use  . Drug Use: Not on file  . Sexually Active: Not on file   Other Topics Concern  . Not on file   Social History Narrative  . No narrative on file    Past Surgical History  Procedure Laterality Date  . Tonsillectomy      Family History  Problem Relation Age of Onset  . Leukemia Father   . Learning disabilities Son   . Leukemia Other   . Stroke Other     Allergies  Allergen Reactions  . Iodine     REACTION: neck swells    Current Outpatient Prescriptions on File Prior to Visit  Medication Sig Dispense Refill  . aspirin EC 81 MG tablet Take 81 mg by mouth daily.      [name] omeprazole (PRILOSEC) 40 MG capsule TAKE 1 CAPSULE BY MOUTH DAILY.  90 capsule  1  . simvastatin (ZOCOR) 40 MG tablet Take 1 tablet (40 mg total) by mouth at bedtime.  90 tablet  1  . Alum & Mag Hydroxide-Simeth  (MAGIC MOUTHWASH W/LIDOCAINE) SOLN Take 5 mLs by mouth 3 (three) times daily as needed.  150 mL  0  . meloxicam (MOBIC) 7.5 MG tablet Take 1 tablet (7.5 mg total) by mouth daily.  30 tablet  2  . metoprolol tartrate (LOPRESSOR) 25 MG tablet Take 25 mg by mouth 2 (two) times daily.      . pantoprazole (PROTONIX) 40 MG tablet Take 40 mg by mouth daily.      . Tamsulosin HCl (FLOMAX) 0.4 MG CAPS Take 1 capsule (0.4 mg total) by mouth daily.  7 capsule  0  . traMADol (ULTRAM) 50 MG tablet One tablet every 8 hours as needed for pain.  May cause drowsiness- do not drive after taking this medicine.  20 tablet  0   No current facility-administered medications on file prior to visit.    BP 130/82  Pulse 56  Temp(Src) 97.6 F (36.4 C) (Oral)  Resp 16  Ht 5\' 9"  (1.753 m)  Wt 169 lb (76.658 kg)  BMI 24.95 kg/m2  SpO2 99%    Objective:   Physical  Exam  Constitutional: He appears well-developed and well-nourished. No distress.  Cardiovascular: Normal rate and regular rhythm.   No murmur heard. Pulmonary/Chest: Effort normal and breath sounds normal. No respiratory distress. He has no wheezes. He has no rales. He exhibits no tenderness.  Abdominal: Soft. Bowel sounds are normal. He exhibits no distension and no mass. There is no tenderness. There is no rebound and no guarding.  Genitourinary:  R CVAT  Psychiatric: He has a normal mood and affect. His behavior is normal. Judgment and thought content normal.          Assessment & Plan:

## 2013-01-21 NOTE — Patient Instructions (Addendum)
Please complete your CT on the first floor. Complete your lab work prior to leaving.

## 2013-01-21 NOTE — Addendum Note (Signed)
Addended by: Sandford Craze on: 01/21/2013 12:31 PM   Modules accepted: Orders

## 2013-01-21 NOTE — Assessment & Plan Note (Signed)
72 yr old male with right flank pain, microscopic hematuria, R CVAT.  + Hx of nephrolithiasis.  Plan CT abd to rule out stone.  Obtain bmet to evaluate renal function.

## 2013-01-23 ENCOUNTER — Telehealth: Payer: Self-pay | Admitting: Family

## 2013-01-23 DIAGNOSIS — R911 Solitary pulmonary nodule: Secondary | ICD-10-CM

## 2013-01-23 NOTE — Telephone Encounter (Signed)
Lab work normal. Ct still shows pulmonary nodule. I would like him to meet with pulmonology.

## 2013-01-25 NOTE — Telephone Encounter (Signed)
Notified pt and he is agreeable to proceed with referral. Pt prefers [place] location, notation made on referral.

## 2013-02-01 ENCOUNTER — Encounter: Payer: Self-pay | Admitting: Critical Care Medicine

## 2013-02-01 ENCOUNTER — Ambulatory Visit (INDEPENDENT_AMBULATORY_CARE_PROVIDER_SITE_OTHER): Payer: Medicare Other | Admitting: Critical Care Medicine

## 2013-02-01 VITALS — BP 120/82 | HR 67 | Temp 97.8°F | Ht 69.0 in | Wt 167.0 lb

## 2013-02-01 DIAGNOSIS — R9389 Abnormal findings on diagnostic imaging of other specified body structures: Secondary | ICD-10-CM

## 2013-02-01 DIAGNOSIS — R911 Solitary pulmonary nodule: Secondary | ICD-10-CM

## 2013-02-01 MED ORDER — NICOTINE 10 MG IN INHA: RESPIRATORY_TRACT | Status: DC

## 2013-02-01 NOTE — Patient Instructions (Addendum)
Nicotrol inhaler sent to downstairs pharmacy, use 60-80 puff per cartridge, use 8 cartridges per day Return 4 months and get CT chest SAME DAY before visit Focus on smoking cessation.

## 2013-02-01 NOTE — Assessment & Plan Note (Signed)
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 Ongoing tobacco use Plan Repeat CT scan in 4 months and pursue smoking cessation with nicotine replacement therapy

## 2013-02-01 NOTE — Progress Notes (Signed)
Subjective:    Patient ID: Robert Bates, male    DOB: 1941-06-11, 72 y.o.   MRN: [id number]  HPI 72 y.o.M This patient is referred for abnormality on CT scan. The patient had a CT scan of the chest 3 months ago because of back pain. A left upper lobe abnormality was seen which was ground glass in nature. The patient had a followup CT scan on 01/21/2013 which showed no real change in the 1.9 x 1.6 cm left upper lobe ground glass nodular infiltrate. The patient currently smokes one pack a day of cigarettes. There is no real cough. There is no chest pain. Is no wheezing. There's no mucus production. There no fever chills or sweats. Patient notes pneumonia in the 1950s but none since. Patient works as a Naval architect. There is no fume or dust or asbestos exposure history.  Past Medical History  Diagnosis Date  . GERD (gastroesophageal reflux disease)   . Alcoholism     past problem per pt but admits to still drinking - unclear how much  . Tobacco abuse   . Hematuria     refuses work up or referral - understands risks of morbidity / mortality - 11/2008, 12/2008  . Hyperlipemia   . Kidney stones      Family History  Problem Relation Age of Onset  . Leukemia Father   . Learning disabilities Son   . Leukemia Other   . Stroke Other   . Emphysema Father      History   Social History  . Marital Status: Married    Spouse Name: N/A    Number of Children: 2  . Years of Education: N/A   Occupational History  . Retired     Naval architect  .     Social History Main Topics  . Smoking status: Current Every Day Smoker -- 1.00 packs/day  . Smokeless tobacco: Former Neurosurgeon    Types: Chew    Quit date: 11/04/1958     Comment: Started smoking at age 66.  Currently smoking 1 ppd.  . Alcohol Use: Yes     Comment: Pt reports rare use  . Drug Use: Not on file  . Sexually Active: Not on file   Other Topics Concern  . Not on file   Social History Narrative  . No narrative on file      Allergies  Allergen Reactions  . Iodine     REACTION: neck swells     Outpatient Prescriptions Prior to Visit  Medication Sig Dispense Refill  . aspirin EC 81 MG tablet Take 81 mg by mouth daily.      . metoprolol tartrate (LOPRESSOR) 25 MG tablet Take 1 tablet (25 mg total) by mouth 2 (two) times daily.  180 tablet  1  . omeprazole (PRILOSEC) 40 MG capsule TAKE 1 CAPSULE BY MOUTH DAILY.  90 capsule  1  . simvastatin (ZOCOR) 40 MG tablet Take 1 tablet (40 mg total) by mouth at bedtime.  90 tablet  1  . traMADol (ULTRAM) 50 MG tablet One tablet every 8 hours as needed for pain.  May cause drowsiness- do not drive after taking this medicine.  20 tablet  0  . methylPREDNISolone (MEDROL DOSEPAK) 4 MG tablet follow package directions  21 tablet  0   No facility-administered medications prior to visit.       Review of Systems  Constitutional: Positive for chills, diaphoresis and activity change. Negative for fever, appetite change, fatigue and unexpected  weight change.  HENT: Positive for neck pain and tinnitus. Negative for hearing loss, ear pain, nosebleeds, congestion, sore throat, facial swelling, rhinorrhea, sneezing, mouth sores, trouble swallowing, neck stiffness, dental problem, voice change, postnasal drip, sinus pressure and ear discharge.   Eyes: Negative for photophobia, discharge, itching and visual disturbance.  Respiratory: Positive for cough and wheezing. Negative for apnea, choking, chest tightness, shortness of breath and stridor.   Cardiovascular: Positive for leg swelling. Negative for chest pain and palpitations.  Gastrointestinal: Negative for nausea, vomiting, abdominal pain, constipation, blood in stool and abdominal distention.  Genitourinary: Positive for flank pain. Negative for dysuria, urgency, frequency, hematuria, decreased urine volume and difficulty urinating.  Musculoskeletal: Positive for myalgias and back pain. Negative for joint swelling, arthralgias  and gait problem.  Skin: Negative for color change, pallor and rash.  Neurological: Negative for dizziness, tremors, seizures, syncope, speech difficulty, weakness, light-headedness, numbness and headaches.  Hematological: Negative for adenopathy. Does not bruise/bleed easily.  Psychiatric/Behavioral: Negative for confusion, sleep disturbance and agitation. The patient is not nervous/anxious.        Objective:   Physical Exam Filed Vitals:   02/01/13 1120  BP: 120/82  Pulse: 67  Temp: 97.8 F (36.6 C)  TempSrc: Oral  Height: 5\' 9"  (1.753 m)  Weight: 167 lb (75.751 kg)  SpO2: 97%    Gen: Pleasant, well-nourished, in no distress,  normal affect  ENT: No lesions,  mouth clear,  oropharynx clear, no postnasal drip  Neck: No JVD, no TMG, no carotid bruits  Lungs: No use of accessory muscles, no dullness to percussion, clear without rales or rhonchi  Cardiovascular: RRR, heart sounds normal, no murmur or gallops, no peripheral edema  Abdomen: soft and NT, no HSM,  BS normal  Musculoskeletal: No deformities, no cyanosis or clubbing  Neuro: alert, non focal  Skin: Warm, no lesions or rashes  01/21/2013: Left upper lobe 1.9 x 1.6 cm groundglass nodular lesion without any additional nodular infiltrates or mediastinal or hilar adenopathy       Assessment & Plan:   Abnormal chest CT Left upper lobe 1.9 x 1.6 cm groundglass nodular lesion which is likely benign but cannot be completely ruled out for malignancy. Ongoing tobacco use Plan Repeat CT scan in 4 months and pursue smoking cessation with nicotine replacement therapy   Updated Medication List Outpatient Encounter Prescriptions as of 02/01/2013  Medication Sig Dispense Refill  . aspirin EC 81 MG tablet Take 81 mg by mouth daily.      . metoprolol tartrate (LOPRESSOR) 25 MG tablet Take 1 tablet (25 mg total) by mouth 2 (two) times daily.  180 tablet  1  . omeprazole (PRILOSEC) 40 MG capsule TAKE 1 CAPSULE BY MOUTH  DAILY.  90 capsule  1  . simvastatin (ZOCOR) 40 MG tablet Take 1 tablet (40 mg total) by mouth at bedtime.  90 tablet  1  . traMADol (ULTRAM) 50 MG tablet One tablet every 8 hours as needed for pain.  May cause drowsiness- do not drive after taking this medicine.  20 tablet  0  . nicotine (NICOTROL) 10 MG inhaler Use 8 cartridges per day, 60-80 puff per cartridge  168 each  3  . [DISCONTINUED] methylPREDNISolone (MEDROL DOSEPAK) 4 MG tablet follow package directions  21 tablet  0   No facility-administered encounter medications on file as of 02/01/2013.

## 2013-04-03 ENCOUNTER — Ambulatory Visit (INDEPENDENT_AMBULATORY_CARE_PROVIDER_SITE_OTHER): Payer: Medicare Other | Admitting: Family Medicine

## 2013-04-03 ENCOUNTER — Encounter: Payer: Self-pay | Admitting: Family Medicine

## 2013-04-03 VITALS — BP 120/88 | Temp 97.0°F | Wt 167.0 lb

## 2013-04-03 DIAGNOSIS — J069 Acute upper respiratory infection, unspecified: Secondary | ICD-10-CM

## 2013-04-03 MED ORDER — HYDROCODONE-HOMATROPINE 5-1.5 MG/5ML PO SYRP: 5.0000 mL | ORAL_SOLUTION | Freq: Every evening | ORAL | Status: DC | PRN

## 2013-04-03 NOTE — Patient Instructions (Signed)
INSTRUCTIONS FOR UPPER RESPIRATORY INFECTION:  -plenty of rest and fluids  -nasal saline wash 2-3 times daily (use prepackaged nasal saline or bottled/distilled water if making your own)   -can use sinex or afrin nasal spray for drainage and nasal congestion - but do NOT use longer then 3-4 days  -can use tylenol or ibuprofen as directed for aches and sorethroat  -in the winter time, using a humidifier at night is helpful (please follow cleaning instructions)  -if you are taking a cough medication - use only as directed, may also try a teaspoon of honey to coat the throat and throat lozenges  -for sore throat, salt water gargles can help  -follow up if you have fevers, facial pain, tooth pain, difficulty breathing or are worsening or not getting better in 5-7 days  

## 2013-04-03 NOTE — Progress Notes (Signed)
Chief Complaint  Patient presents with  . Sinusitis    HPI:  Acute visit for sinus congestion: -started: 4 days ago -symptoms:nasal congestion, sore throat, cough, drainage, sneezing, eyes watery -denies:fever, SOB, NVD, tooth pain, ear pain -has tried:  Goodies -sick contacts: none known -Hx of: no COPD   ROS: See pertinent positives and negatives per HPI.  Past Medical History  Diagnosis Date  . GERD (gastroesophageal reflux disease)   . Alcoholism     past problem per pt but admits to still drinking - unclear how much  . Tobacco abuse   . Hematuria     refuses work up or referral - understands risks of morbidity / mortality - 11/2008, 12/2008  . Hyperlipemia   . Kidney stones     Family History  Problem Relation Age of Onset  . Leukemia Father   . Learning disabilities Son   . Leukemia Other   . Stroke Other   . Emphysema Father     History   Social History  . Marital Status: Married    Spouse Name: N/A    Number of Children: 2  . Years of Education: N/A   Occupational History  . Retired     Naval architect  .     Social History Main Topics  . Smoking status: Current Every Day Smoker -- 1.00 packs/day  . Smokeless tobacco: Former Neurosurgeon    Types: Chew    Quit date: 11/04/1958     Comment: Started smoking at age 67.  Currently smoking 1 ppd.  . Alcohol Use: Yes     Comment: Pt reports rare use  . Drug Use: None  . Sexually Active: None   Other Topics Concern  . None   Social History Narrative  . None    Current outpatient prescriptions:aspirin EC 81 MG tablet, Take 81 mg by mouth daily., Disp: , Rfl: ;  metoprolol tartrate (LOPRESSOR) 25 MG tablet, Take 1 tablet (25 mg total) by mouth 2 (two) times daily., Disp: 180 tablet, Rfl: 1;  nicotine (NICOTROL) 10 MG inhaler, Use 8 cartridges per day, 60-80 puff per cartridge, Disp: 168 each, Rfl: 3;  omeprazole (PRILOSEC) 40 MG capsule, TAKE 1 CAPSULE BY MOUTH DAILY., Disp: 90 capsule, Rfl: 1 simvastatin  (ZOCOR) 40 MG tablet, Take 1 tablet (40 mg total) by mouth at bedtime., Disp: 90 tablet, Rfl: 1;  traMADol (ULTRAM) 50 MG tablet, One tablet every 8 hours as needed for pain.  May cause drowsiness- do not drive after taking this medicine., Disp: 20 tablet, Rfl: 0;  HYDROcodone-homatropine (HYCODAN) 5-1.5 MG/5ML syrup, Take 5 mLs by mouth at bedtime as needed for cough., Disp: 120 mL, Rfl: 0  EXAM:  Filed Vitals:   04/03/13 1048  BP: 120/88  Temp: 97 F (36.1 C)    Body mass index is 24.65 kg/(m^2).  GENERAL: vitals reviewed and listed above, alert, oriented, appears well hydrated and in no acute distress  HEENT: atraumatic, conjunttiva clear, no obvious abnormalities on inspection of external nose and ears, normal appearance of ear canals and TMs, clear nasal congestion, mild post oropharyngeal erythema with PND, no tonsillar edema or exudate, no sinus TTP  NECK: no obvious masses on inspection  LUNGS: clear to auscultation bilaterally, no wheezes, rales or rhonchi, good air movement  CV: HRRR, no peripheral edema  MS: moves all extremities without noticeable abnormality  PSYCH: pleasant and cooperative, no obvious depression or anxiety  ASSESSMENT AND PLAN:  Discussed the following assessment and plan:  Upper respiratory infection - Plan: HYDROcodone-homatropine (HYCODAN) 5-1.5 MG/5ML syrup  -likely viral, no sings of bacterial infection - supportive care, cough medication, risks discussed - return precautions -Patient advised to return or notify a doctor immediately if symptoms worsen or persist or new concerns arise.  There are no Patient Instructions on file for this visit.   Kriste Basque R.

## 2013-04-16 ENCOUNTER — Telehealth: Payer: Self-pay | Admitting: Critical Care Medicine

## 2013-04-16 NOTE — Telephone Encounter (Signed)
Order for CT is already in EPIC as Dr. Delford Field ordered this at last OV. Please advise PCC's thanks

## 2013-04-16 NOTE — Telephone Encounter (Signed)
Called pt to schd recall apt. In notes from 3/31, he is to have a CT scan prior to apt on same day. Could someone please order this. He is schd for an apt on 7/31 with [facility] in [place].

## 2013-04-19 NOTE — Telephone Encounter (Signed)
Pt scheduled for 06/03/13@10 :00am unable to reach by phone mailed a note to pt Robert Bates

## 2013-05-24 ENCOUNTER — Telehealth: Payer: Self-pay | Admitting: Critical Care Medicine

## 2013-05-25 NOTE — Telephone Encounter (Signed)
Called [facility] CT and cancelled this appt spoke to carolyn  Tobe Sos

## 2013-06-01 ENCOUNTER — Encounter: Payer: Self-pay | Admitting: Gastroenterology

## 2013-06-03 ENCOUNTER — Ambulatory Visit (HOSPITAL_BASED_OUTPATIENT_CLINIC_OR_DEPARTMENT_OTHER): Payer: Medicare Other

## 2013-06-03 ENCOUNTER — Ambulatory Visit: Payer: Medicare Other | Admitting: Critical Care Medicine

## 2013-06-11 ENCOUNTER — Telehealth: Payer: Self-pay | Admitting: Family

## 2013-06-11 NOTE — Telephone Encounter (Signed)
I am sorry but I believe too that he should wear his seatbelt for safety. He can look into getting the soft cover for the seatbelt so it does not hurt him.

## 2013-06-11 NOTE — Telephone Encounter (Signed)
Patient states that it hurts to wear a seatbelt. He got a ticket yesterday from not wearing his seatbelt. He would like to know if Efraim Kaufmann would write a note stating that patient should not wear his seat belt? So, he won't have to pay for the ticket.

## 2013-06-14 NOTE — Telephone Encounter (Signed)
Notified pt and he voices understanding. 

## 2013-07-27 ENCOUNTER — Ambulatory Visit (INDEPENDENT_AMBULATORY_CARE_PROVIDER_SITE_OTHER): Payer: Medicare Other

## 2013-07-27 DIAGNOSIS — Z23 Encounter for immunization: Secondary | ICD-10-CM

## 2013-07-28 ENCOUNTER — Ambulatory Visit (INDEPENDENT_AMBULATORY_CARE_PROVIDER_SITE_OTHER): Payer: Medicare Other | Admitting: Family

## 2013-07-28 ENCOUNTER — Ambulatory Visit (HOSPITAL_BASED_OUTPATIENT_CLINIC_OR_DEPARTMENT_OTHER)
Admission: RE | Admit: 2013-07-28 | Discharge: 2013-07-28 | Disposition: A | Discharge status: Home / Self Care | Payer: Medicare Other | Source: Ambulatory Visit | Attending: Family | Admitting: Family

## 2013-07-28 ENCOUNTER — Encounter: Payer: Self-pay | Admitting: Family

## 2013-07-28 VITALS — BP 116/78 | HR 63 | Temp 97.6°F | Resp 16 | Ht 69.0 in | Wt 167.1 lb

## 2013-07-28 DIAGNOSIS — H547 Unspecified visual loss: Secondary | ICD-10-CM | POA: Insufficient documentation

## 2013-07-28 DIAGNOSIS — G9389 Other specified disorders of brain: Secondary | ICD-10-CM | POA: Insufficient documentation

## 2013-07-28 DIAGNOSIS — H539 Unspecified visual disturbance: Secondary | ICD-10-CM

## 2013-07-28 LAB — SEDIMENTATION RATE: Sed Rate: 8 mm/hr (ref 0–16)

## 2013-07-28 NOTE — Patient Instructions (Addendum)
Stop by the front desk for further info on your eye appointment.  Please complete lab work prior to leaving.  Complete CT head on the first floor.

## 2013-07-28 NOTE — Assessment & Plan Note (Signed)
24 hour hx of acute blurring in the right eye. Will obtain ESR to evaluate for temporal arteritis. Obtain CT head to exclude CVA. Refer to opthalmology for urgent visit to exclude retinal detachment.

## 2013-07-28 NOTE — Progress Notes (Signed)
Subjective:    Patient ID: Robert Bates, male    DOB: 1941/03/22, 72 y.o.   MRN: [id number]  HPI  Robert Bates is a 72 yr old male who presents today with two concerns.  1) Neck pain- pt reports intermittent neck pain for 1 year. Occurs several times a week. Pain can occur on either side of his neck.    2) Blurred vision-  He reports that the blurriness started yesterday.  He reports associated pain in the right eye which started yesterday.  He reports that the pain in his eye is "like a dull tension pain."  He saw the eye doctor yesterday.  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      Review of Systems See HPI  Past Medical History  Diagnosis Date  . GERD (gastroesophageal reflux disease)   . Alcoholism     past problem per pt but admits to still drinking - unclear how much  . Tobacco abuse   . Hematuria     refuses work up or referral - understands risks of morbidity / mortality - 11/2008, 12/2008  . Hyperlipemia   . Kidney stones     History   Social History  . Marital Status: Married    Spouse Name: N/A    Number of Children: 2  . Years of Education: N/A   Occupational History  . Retired     Naval architect  .     Social History Main Topics  . Smoking status: Current Every Day Smoker -- 1.00 packs/day  . Smokeless tobacco: Former Neurosurgeon    Types: Chew    Quit date: 11/04/1958     Comment: Started smoking at age 20.  Currently smoking 1 ppd.  . Alcohol Use: Yes     Comment: Pt reports rare use  . Drug Use: Not on file  . Sexual Activity: Not on file   Other Topics Concern  . Not on file   Social History Narrative  . No narrative on file    Past Surgical History  Procedure Laterality Date  . Tonsillectomy    . Hernia repair      Family History  Problem Relation Age of Onset  . Leukemia Father   . Learning disabilities Son   . Leukemia Other   . Stroke Other   . Emphysema Father     Allergies  Allergen Reactions  .  Iodine     REACTION: neck swells    Current Outpatient Prescriptions on File Prior to Visit  Medication Sig Dispense Refill  . aspirin EC 81 MG tablet Take 81 mg by mouth daily.      . metoprolol tartrate (LOPRESSOR) 25 MG tablet Take 1 tablet (25 mg total) by mouth 2 (two) times daily.  180 tablet  1  . nicotine (NICOTROL) 10 MG inhaler Use 8 cartridges per day, 60-80 puff per cartridge  168 each  3  . omeprazole (PRILOSEC) 40 MG capsule TAKE 1 CAPSULE BY MOUTH DAILY.  90 capsule  1  . simvastatin (ZOCOR) 40 MG tablet Take 1 tablet (40 mg total) by mouth at bedtime.  90 tablet  1  . traMADol (ULTRAM) 50 MG tablet One tablet every 8 hours as needed for pain.  May cause drowsiness- do not drive after taking this medicine.  20 tablet  0   No current facility-administered medications on file prior to visit.    BP 116/78  Pulse 63  Temp(Src) 97.6  F (36.4 C) (Oral)  Resp 16  Ht 5\' 9"  (1.753 m)  Wt 167 lb 1.3 oz (75.787 kg)  BMI 24.66 kg/m2  SpO2 96%       Objective:   Physical Exam  Constitutional: He is oriented to person, place, and time. He appears well-developed and well-nourished. No distress.  HENT:  Head: Normocephalic and atraumatic.  Eyes: Conjunctivae and EOM are normal. Pupils are equal, round, and reactive to light.  Attempted funduscopic exam. Limited due to constriction of pupils.  Cardiovascular: Normal rate and regular rhythm.   No murmur heard. Pulmonary/Chest: Effort normal and breath sounds normal. No respiratory distress. He has no wheezes. He has no rales. He exhibits no tenderness.  Neurological: He is alert and oriented to person, place, and time.  + facial symmetry. Bilateral UE strength is 5/5 Bilateral LE strength is 4-5/5  Psychiatric: He has a normal mood and affect. His behavior is normal. Judgment and thought content normal.          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  Will plan to address in further detail next visit.

## 2013-07-29 ENCOUNTER — Telehealth: Payer: Self-pay | Admitting: Family

## 2013-07-29 DIAGNOSIS — G9389 Other specified disorders of brain: Secondary | ICD-10-CM

## 2013-07-29 NOTE — Telephone Encounter (Signed)
Attempted to reach pt, no answer

## 2013-07-29 NOTE — Telephone Encounter (Signed)
Reviewed CT brain. There is an abnormality on the right front brain- needs evaluation with MRI.  Might be cause for his blurred vision.  MRI order pended below.   Attempted to call patient.  No answer, unable to leave message.  Please try patient again later.  Order pended.

## 2013-07-29 NOTE — Telephone Encounter (Signed)
Notified pt and he is agreeable to proceed with MRI. 

## 2013-07-30 ENCOUNTER — Telehealth: Payer: Self-pay | Admitting: Family

## 2013-07-30 DIAGNOSIS — G9389 Other specified disorders of brain: Secondary | ICD-10-CM

## 2013-07-30 LAB — BASIC METABOLIC PANEL
BUN: 19 mg/dL (ref 6–23)
CO2: 31 mEq/L (ref 19–32)
Calcium: 9.4 mg/dL (ref 8.4–10.5)
Chloride: 105 mEq/L (ref 96–112)
Creat: 0.84 mg/dL (ref 0.50–1.35)
Glucose, Bld: 106 mg/dL — ABNORMAL HIGH (ref 70–99)
Potassium: 4.5 mEq/L (ref 3.5–5.3)
Sodium: 143 mEq/L (ref 135–145)

## 2013-07-30 NOTE — Telephone Encounter (Signed)
Pt needs bmet prior to mri. Instructed by staff to come to lab today.

## 2013-07-31 ENCOUNTER — Ambulatory Visit (HOSPITAL_BASED_OUTPATIENT_CLINIC_OR_DEPARTMENT_OTHER)
Admission: RE | Admit: 2013-07-31 | Discharge: 2013-07-31 | Disposition: A | Discharge status: Home / Self Care | Payer: Medicare Other | Source: Ambulatory Visit | Attending: Family | Admitting: Family

## 2013-07-31 DIAGNOSIS — G9389 Other specified disorders of brain: Secondary | ICD-10-CM

## 2013-07-31 DIAGNOSIS — H538 Other visual disturbances: Secondary | ICD-10-CM | POA: Insufficient documentation

## 2013-07-31 DIAGNOSIS — D320 Benign neoplasm of cerebral meninges: Secondary | ICD-10-CM | POA: Insufficient documentation

## 2013-07-31 DIAGNOSIS — R51 Headache: Secondary | ICD-10-CM | POA: Insufficient documentation

## 2013-07-31 DIAGNOSIS — J329 Chronic sinusitis, unspecified: Secondary | ICD-10-CM | POA: Insufficient documentation

## 2013-07-31 MED ORDER — GADOBENATE DIMEGLUMINE 529 MG/ML IV SOLN
15.0000 mL | Freq: Once | INTRAVENOUS | Status: AC | PRN
  Administered 2013-07-31: 15 mL via INTRAVENOUS

## 2013-08-01 ENCOUNTER — Telehealth: Payer: Self-pay | Admitting: Family

## 2013-08-01 DIAGNOSIS — D329 Benign neoplasm of meninges, unspecified: Secondary | ICD-10-CM

## 2013-08-01 MED ORDER — METHYLPREDNISOLONE 4 MG PO KIT: PACK | ORAL | Status: DC

## 2013-08-01 NOTE — Telephone Encounter (Signed)
Called pt. Reviewed MRI results with pt and his wife.  Questions answered. Recommend medrol dose pak and referral to neurosurgery.  Pt verbalizes understanding.

## 2013-08-04 ENCOUNTER — Encounter: Payer: Self-pay | Admitting: Family

## 2013-08-04 ENCOUNTER — Telehealth: Payer: Self-pay | Admitting: Family

## 2013-08-04 NOTE — Telephone Encounter (Signed)
Late entry:  Pt was instructed at time of phone call to discontinue driving.  He verbalizes understanding.

## 2013-08-04 NOTE — Telephone Encounter (Signed)
HE NEEDS A NOTE FAXED TO HIS WORK [phone] STATING HE HAS BEEN OUT OF WORK SINCE 9-29 AND THE RETURN DATE IS NOT SET YET AS HIS VISION IS STILL BLURRY.

## 2013-08-04 NOTE — Telephone Encounter (Signed)
See letter.  He may also need to obtain FMLA paperwork from his employer.

## 2013-08-04 NOTE — Telephone Encounter (Signed)
Faxed letter to employer. Notified pt and he voices understanding.

## 2013-08-06 ENCOUNTER — Telehealth: Payer: Self-pay | Admitting: Family

## 2013-08-06 NOTE — Telephone Encounter (Signed)
Patient states that his work is wanting him to work. He would like to know if Robert Bates thinks that it is safe for him to drive? 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

## 2013-08-06 NOTE — Telephone Encounter (Signed)
Noted  

## 2013-08-06 NOTE — Telephone Encounter (Signed)
Spoke with pt, advised him per 08/01/13 phone note Provider recommended that he not drive. Letter was faxed to employer as well. Pt now states his employer called him wanting to know if he wanted to work. Advised pt that further recommendations re: driving should be obtained from his neurosurgeon. States he saw Dr Franky Macho this week and will call him for further recommendations.

## 2013-08-09 ENCOUNTER — Other Ambulatory Visit (HOSPITAL_BASED_OUTPATIENT_CLINIC_OR_DEPARTMENT_OTHER): Payer: Self-pay | Admitting: Neurosurgery

## 2013-08-09 DIAGNOSIS — D320 Benign neoplasm of cerebral meninges: Secondary | ICD-10-CM

## 2013-08-14 ENCOUNTER — Ambulatory Visit (HOSPITAL_BASED_OUTPATIENT_CLINIC_OR_DEPARTMENT_OTHER)
Admission: RE | Admit: 2013-08-14 | Discharge: 2013-08-14 | Disposition: A | Discharge status: Home / Self Care | Payer: Medicare Other | Source: Ambulatory Visit | Attending: Neurosurgery | Admitting: Neurosurgery

## 2013-08-14 DIAGNOSIS — D320 Benign neoplasm of cerebral meninges: Secondary | ICD-10-CM

## 2013-08-14 DIAGNOSIS — H538 Other visual disturbances: Secondary | ICD-10-CM | POA: Insufficient documentation

## 2013-08-14 MED ORDER — GADOBENATE DIMEGLUMINE 529 MG/ML IV SOLN
15.0000 mL | Freq: Once | INTRAVENOUS | Status: AC | PRN
  Administered 2013-08-14: 15 mL via INTRAVENOUS

## 2013-09-13 ENCOUNTER — Other Ambulatory Visit: Payer: Self-pay | Admitting: Family

## 2013-09-20 ENCOUNTER — Telehealth: Payer: Self-pay | Admitting: Family

## 2013-09-20 NOTE — Telephone Encounter (Signed)
Patient informed not showing vaccine given w/i [facility] system; he will check Insurance coverage and then call to schedule nurse visit for injection/SLS

## 2013-09-20 NOTE — Telephone Encounter (Signed)
Patient wants to know if he has ever had the shingles shot?

## 2013-09-24 ENCOUNTER — Ambulatory Visit (INDEPENDENT_AMBULATORY_CARE_PROVIDER_SITE_OTHER): Payer: Medicare Other | Admitting: Physician Assistant

## 2013-09-24 ENCOUNTER — Encounter: Payer: Self-pay | Admitting: Physician Assistant

## 2013-09-24 VITALS — BP 124/86 | HR 62 | Temp 97.5°F | Resp 18 | Ht 69.0 in | Wt 170.0 lb

## 2013-09-24 DIAGNOSIS — R309 Painful micturition, unspecified: Secondary | ICD-10-CM

## 2013-09-24 DIAGNOSIS — R82998 Other abnormal findings in urine: Secondary | ICD-10-CM

## 2013-09-24 DIAGNOSIS — N23 Unspecified renal colic: Secondary | ICD-10-CM

## 2013-09-24 DIAGNOSIS — R300 Dysuria: Secondary | ICD-10-CM

## 2013-09-24 DIAGNOSIS — R1030 Lower abdominal pain, unspecified: Secondary | ICD-10-CM

## 2013-09-24 DIAGNOSIS — R109 Unspecified abdominal pain: Secondary | ICD-10-CM

## 2013-09-24 DIAGNOSIS — R350 Frequency of micturition: Secondary | ICD-10-CM

## 2013-09-24 DIAGNOSIS — R8290 Unspecified abnormal findings in urine: Secondary | ICD-10-CM

## 2013-09-24 LAB — POCT URINALYSIS DIPSTICK
Bilirubin, UA: NEGATIVE
Glucose, UA: NEGATIVE
Ketones, UA: NEGATIVE
Nitrite, UA: NEGATIVE
Protein, UA: NEGATIVE
Spec Grav, UA: 1.01
Urobilinogen, UA: 0.2
pH, UA: 7.5

## 2013-09-24 MED ORDER — SULFAMETHOXAZOLE-TMP DS 800-160 MG PO TABS: 1.0000 | ORAL_TABLET | Freq: Two times a day (BID) | ORAL | Status: DC

## 2013-09-24 NOTE — Patient Instructions (Signed)
Please increase fluid intake.  Take antibiotic as prescribed.  Take a cranberry supplement.  Pain could be coming solely from infection or could be due to kidney stone noted on prior imaging.  If symptoms not improving, you will need to see your Urologist.

## 2013-09-24 NOTE — Progress Notes (Signed)
Pre visit review using our clinic review tool, if applicable. No additional management support is needed unless otherwise documented below in the visit note/SLS  

## 2013-09-24 NOTE — Progress Notes (Signed)
Patient ID: Robert Bates, male   DOB: 03/20/1941, 72 y.o.   MRN: [id number]  Patient presents to clinic today c/o suprapubic pain, dysuria, urinary frequency and urgency for the past month.  Patient denies gross hematuria, nausea, vomiting, flank pain or fever.  Patient has history of nephrolithiasis and microscopic hematuria.  Is followed by Urology. Patient states urologist wanted to do a cystoscopy but he did not have it done. Patient is a smoker.  Patient denies history of UTI.  Last episode with nephrolithiasis was documented in 01/2013 -- 6 mm stone at left uretero-vesicular junction that was non-obstructing. Patient is unsure if he ever passed the stone.   Past Medical History  Diagnosis Date  . GERD (gastroesophageal reflux disease)   . Alcoholism     past problem per pt but admits to still drinking - unclear how much  . Tobacco abuse   . Hematuria     refuses work up or referral - understands risks of morbidity / mortality - 11/2008, 12/2008  . Hyperlipemia   . Kidney stones     Current Outpatient Prescriptions on File Prior to Visit  Medication Sig Dispense Refill  . aspirin EC 81 MG tablet Take 81 mg by mouth daily.      [name] omeprazole (PRILOSEC) 40 MG capsule TAKE 1 CAPSULE BY MOUTH DAILY.  90 capsule  1  . simvastatin (ZOCOR) 40 MG tablet TAKE 1 TABLET (40 MG TOTAL) BY MOUTH AT BEDTIME.  90 tablet  1  . traMADol (ULTRAM) 50 MG tablet One tablet every 8 hours as needed for pain.  May cause drowsiness- do not drive after taking this medicine.  20 tablet  0   No current facility-administered medications on file prior to visit.    Allergies  Allergen Reactions  . Iodine     REACTION: neck swells    Family History  Problem Relation Age of Onset  . Leukemia Father   . Learning disabilities Son   . Leukemia Other   . Stroke Other   . Emphysema Father     History   Social History  . Marital Status: Married    Spouse Name: N/A    Number of Children: 2  . Years of  Education: N/A   Occupational History  . Retired     Naval architect  .     Social History Main Topics  . Smoking status: Current Every Day Smoker -- 1.00 packs/day  . Smokeless tobacco: Former Neurosurgeon    Types: Chew    Quit date: 11/04/1958     Comment: Started smoking at age 18.  Currently smoking 1 ppd.  . Alcohol Use: Yes     Comment: Pt reports rare use  . Drug Use: None  . Sexual Activity: None   Other Topics Concern  . None   Social History Narrative  . None   ROS -- See HPI.  All other ROS are negative.   Filed Vitals:   09/24/13 1307  BP: 124/86  Pulse: 62  Temp: 97.5 F (36.4 C)  Resp: 18   Physical Exam  Vitals reviewed. Constitutional: He is oriented to person, place, and time and well-developed, well-nourished, and in no distress.  HENT:  Head: Normocephalic and atraumatic.  Eyes: Conjunctivae are normal.  Neck: Neck supple.  Cardiovascular: Normal rate, regular rhythm and normal heart sounds.   Pulmonary/Chest: Effort normal and breath sounds normal. No respiratory distress. He has no wheezes. He has no rales. He exhibits no tenderness.  Abdominal: Soft. Bowel sounds are normal. He exhibits no distension and no mass. There is no rebound and no guarding.  Suprapubic tenderness to palpation noted.  Neurological: He is alert and oriented to person, place, and time.  Skin: Skin is warm and dry. No rash noted.  Psychiatric: Affect normal.   Recent Results (from the past 2160 hour(s))  SEDIMENTATION RATE     Status: None   Collection Time    07/28/13  2:02 PM      Result Value Range   Sed Rate 8  0 - 16 mm/hr  BASIC METABOLIC PANEL     Status: Abnormal   Collection Time    07/30/13 11:58 AM      Result Value Range   Sodium 143  135 - 145 mEq/L   Potassium 4.5  3.5 - 5.3 mEq/L   Chloride 105  96 - 112 mEq/L   CO2 31  19 - 32 mEq/L   Glucose, Bld 106 (*) 70 - 99 mg/dL   BUN 19  6 - 23 mg/dL   Creat 1.61  0.96 - 0.45 mg/dL   Calcium 9.4  8.4 - 40.9  mg/dL  POCT URINALYSIS DIPSTICK     Status: None   Collection Time    09/24/13  1:18 PM      Result Value Range   Color, UA gold     Clarity, UA bubbly     Glucose, UA neg     Bilirubin, UA neg     Ketones, UA neg     Spec Grav, UA 1.010     Blood, UA moderate ++     pH, UA 7.5     Protein, UA neg     Urobilinogen, UA 0.2     Nitrite, UA neg     Leukocytes, UA Trace      Assessment/Plan: Dysuria Urinalysis positive for blood and leukocyte esterase.  Will send for micro and culture.  Rx Bactrim.  Increase fluid intake.  Cranberry Tablet.  Follow-up with Urology for cystoscopy as previously recommended.  Discussed UTI vs Nephrolithiasis vs Bladder Carcinoma as source of pain.  Giving patient's smoking history and history of hematuria, he needs to see his Urologist for evaluation.

## 2013-09-24 NOTE — Assessment & Plan Note (Signed)
Urinalysis positive for blood and leukocyte esterase.  Will send for micro and culture.  Rx Bactrim.  Increase fluid intake.  Cranberry Tablet.  Follow-up with Urology for cystoscopy as previously recommended.  Discussed UTI vs Nephrolithiasis vs Bladder Carcinoma as source of pain.  Giving patient's smoking history and history of hematuria, he needs to see his Urologist for evaluation.

## 2013-09-25 LAB — URINALYSIS, ROUTINE W REFLEX MICROSCOPIC
Bilirubin Urine: NEGATIVE
Glucose, UA: NEGATIVE mg/dL
Ketones, ur: NEGATIVE mg/dL
Leukocytes, UA: NEGATIVE
Nitrite: NEGATIVE
Protein, ur: NEGATIVE mg/dL
Specific Gravity, Urine: 1.016 (ref 1.005–1.030)
Urobilinogen, UA: 0.2 mg/dL (ref 0.0–1.0)
pH: 7 (ref 5.0–8.0)

## 2013-09-25 LAB — CULTURE, URINE COMPREHENSIVE
Colony Count: NO GROWTH
Organism ID, Bacteria: NO GROWTH

## 2013-09-25 LAB — URINALYSIS, MICROSCOPIC ONLY
Bacteria, UA: NONE SEEN
Casts: NONE SEEN
Crystals: NONE SEEN
Squamous Epithelial / LPF: NONE SEEN

## 2013-10-20 ENCOUNTER — Other Ambulatory Visit: Payer: Self-pay | Admitting: *Deleted

## 2013-10-20 MED ORDER — TRAMADOL HCL 50 MG PO TABS: ORAL_TABLET | ORAL | Status: DC

## 2013-10-20 NOTE — Telephone Encounter (Signed)
Pt called requesting refill of pain medication for his back that we prescribed him in the past. Has been out of work x 2 weeks for surgery on his eye. Continues to have back pain and would like refill of ? Tramadol. Last rx sent in March.  Please advise.

## 2013-10-20 NOTE — Telephone Encounter (Signed)
Rx called to pharmacy voicemail and notified pt. He states he will call back to schedule appt.

## 2013-10-20 NOTE — Telephone Encounter (Signed)
OK to call in 20 tabs as below.  Please ask him to schedule follow up after the holidays.

## 2013-10-20 NOTE — Telephone Encounter (Signed)
Pt would like rx to go to [facility] in [place].

## 2013-10-22 ENCOUNTER — Encounter: Payer: Self-pay | Admitting: Family

## 2013-10-22 ENCOUNTER — Ambulatory Visit (INDEPENDENT_AMBULATORY_CARE_PROVIDER_SITE_OTHER): Payer: Medicare Other | Admitting: Family

## 2013-10-22 VITALS — BP 130/80 | HR 68 | Temp 97.8°F | Resp 16 | Ht 69.0 in | Wt 169.0 lb

## 2013-10-22 DIAGNOSIS — D320 Benign neoplasm of cerebral meninges: Secondary | ICD-10-CM

## 2013-10-22 DIAGNOSIS — M545 Low back pain: Secondary | ICD-10-CM

## 2013-10-22 DIAGNOSIS — D329 Benign neoplasm of meninges, unspecified: Secondary | ICD-10-CM

## 2013-10-22 MED ORDER — MELOXICAM 7.5 MG PO TABS: 7.5000 mg | ORAL_TABLET | Freq: Every day | ORAL | Status: DC

## 2013-10-22 NOTE — Progress Notes (Signed)
Pre visit review using our clinic review tool, if applicable. No additional management support is needed unless otherwise documented below in the visit note. 

## 2013-10-22 NOTE — Patient Instructions (Signed)
Please start meloxicam. You may use tramadol sparingly for sever pain. Follow up in 3 months.

## 2013-10-22 NOTE — Progress Notes (Signed)
Subjective:    Patient ID: Robert Bates, male    DOB: [birth year], 72 y.o.   MRN: [id number]  HPI  Robert Bates is a 72 yr old male who presents today with chief complaint of low back pain. Pain started 2 weeks  Ago. He has tried some otc analgesic but he is unsure of name. Reports pain is non-radiating.  Reports pain is improved by laying on his side.  Getting up and moving around and bending forward make the pain worse.  Brain tumor-  Saw Dr. Mikal Plane.  He had cataract surgery.  He was told by eye doctor Dr. Hazle Quant.    Review of Systems    see HPI  Past Medical History  Diagnosis Date  . GERD (gastroesophageal reflux disease)   . Alcoholism     past problem per pt but admits to still drinking - unclear how much  . Tobacco abuse   . Hematuria     refuses work up or referral - understands risks of morbidity / mortality - 11/2008, 12/2008  . Hyperlipemia   . Kidney stones     History   Social History  . Marital Status: Married    Spouse Name: N/A    Number of Children: 2  . Years of Education: N/A   Occupational History  . Retired     Naval architect  .     Social History Main Topics  . Smoking status: Current Every Day Smoker -- 1.00 packs/day  . Smokeless tobacco: Former Neurosurgeon    Types: Chew    Quit date: 11/04/1958     Comment: Started smoking at age 82.  Currently smoking 1 ppd.  . Alcohol Use: Yes     Comment: Pt reports rare use  . Drug Use: Not on file  . Sexual Activity: Not on file   Other Topics Concern  . Not on file   Social History Narrative  . No narrative on file    Past Surgical History  Procedure Laterality Date  . Tonsillectomy    . Hernia repair      Family History  Problem Relation Age of Onset  . Leukemia Father   . Learning disabilities Son   . Leukemia Other   . Stroke Other   . Emphysema Father     Allergies  Allergen Reactions  . Iodine     REACTION: neck swells    Current Outpatient Prescriptions on File Prior to Visit    Medication Sig Dispense Refill  . aspirin EC 81 MG tablet Take 81 mg by mouth daily.      . metoprolol tartrate (LOPRESSOR) 25 MG tablet TAKE 1 TABLET (25 MG TOTAL) BY MOUTH 2 (TWO) TIMES DAILY. Patient reports only taking once daily 11.21.14      . omeprazole (PRILOSEC) 40 MG capsule TAKE 1 CAPSULE BY MOUTH DAILY.  90 capsule  1  . simvastatin (ZOCOR) 40 MG tablet TAKE 1 TABLET (40 MG TOTAL) BY MOUTH AT BEDTIME.  90 tablet  1  . traMADol (ULTRAM) 50 MG tablet One tablet every 8 hours as needed for pain.  May cause drowsiness- do not drive after taking this medicine.  20 tablet  0   No current facility-administered medications on file prior to visit.    BP 130/80  Pulse 68  Temp(Src) 97.8 F (36.6 C) (Oral)  Resp 16  Ht 5\' 9"  (1.753 m)  Wt 169 lb (76.658 kg)  BMI 24.95 kg/m2  SpO2 97%  Objective:   Physical Exam  Constitutional: He is oriented to person, place, and time. He appears well-developed and well-nourished. No distress.  Cardiovascular: Normal rate and regular rhythm.   No murmur heard. Pulmonary/Chest: Effort normal and breath sounds normal. No respiratory distress. He has no wheezes. He has no rales. He exhibits no tenderness.  Musculoskeletal: He exhibits no edema.       Thoracic back: He exhibits no tenderness.       Lumbar back: He exhibits no tenderness.  Bilateral LE strength is 5/5  Neurological: He is alert and oriented to person, place, and time.  Psychiatric: He has a normal mood and affect. His behavior is normal. Judgment and thought content normal.          Assessment & Plan:

## 2013-10-25 ENCOUNTER — Encounter: Payer: Self-pay | Admitting: Family

## 2013-10-25 DIAGNOSIS — M545 Low back pain: Secondary | ICD-10-CM | POA: Insufficient documentation

## 2013-10-25 DIAGNOSIS — D329 Benign neoplasm of meninges, unspecified: Secondary | ICD-10-CM

## 2013-10-25 HISTORY — DX: Benign neoplasm of meninges, unspecified: D32.9

## 2013-10-25 NOTE — Assessment & Plan Note (Signed)
Clinically stable. Per pt, no operative plans.  Will request most recent consult note from Dr. Franky Macho.

## 2013-10-25 NOTE — Assessment & Plan Note (Signed)
Trial of meloxicam.  

## 2013-10-26 ENCOUNTER — Telehealth: Payer: Self-pay | Admitting: *Deleted

## 2013-10-26 NOTE — Telephone Encounter (Signed)
Requested records from [place] at Dr Sueanne Margarita office . She states most recent visit was 08/19/13 and fax that note as we have pt's previous notes. Awaiting records.

## 2013-10-26 NOTE — Telephone Encounter (Signed)
Received most recent notes from Dr Hazle Quant and Dr Franky Macho and forwarded them to Provider for review.

## 2013-10-26 NOTE — Telephone Encounter (Signed)
Message copied by Kathi Simpers on Tue Oct 26, 2013  7:42 AM ------      Message from: O'SULLIVAN, MELISSA      Created: Fri Oct 22, 2013  9:34 AM       Could you please request office notes from dr. Digby's office and most recent office note from Dr. Franky Macho? thanks ------

## 2013-10-26 NOTE — Telephone Encounter (Signed)
Spoke with Dr Digby's office, [phone] and requested most recent office note.  Called Dr Sueanne Margarita office, [phone] and will have to call back around 9am when they open.

## 2013-12-31 ENCOUNTER — Other Ambulatory Visit: Payer: Self-pay | Admitting: Family

## 2014-01-21 ENCOUNTER — Ambulatory Visit: Payer: Medicare Other | Admitting: Family

## 2014-01-25 ENCOUNTER — Ambulatory Visit: Payer: Medicare Other | Admitting: Family

## 2014-01-26 ENCOUNTER — Encounter: Payer: Self-pay | Admitting: Family

## 2014-01-26 ENCOUNTER — Telehealth: Payer: Self-pay | Admitting: Family

## 2014-01-26 ENCOUNTER — Other Ambulatory Visit: Payer: Self-pay | Admitting: Family

## 2014-01-26 ENCOUNTER — Ambulatory Visit (INDEPENDENT_AMBULATORY_CARE_PROVIDER_SITE_OTHER): Payer: Medicare Other | Admitting: Family

## 2014-01-26 VITALS — BP 122/80 | HR 60 | Temp 97.5°F | Resp 16 | Ht 69.0 in | Wt 174.0 lb

## 2014-01-26 DIAGNOSIS — R3129 Other microscopic hematuria: Secondary | ICD-10-CM

## 2014-01-26 DIAGNOSIS — D320 Benign neoplasm of cerebral meninges: Secondary | ICD-10-CM

## 2014-01-26 DIAGNOSIS — E785 Hyperlipidemia, unspecified: Secondary | ICD-10-CM

## 2014-01-26 DIAGNOSIS — D329 Benign neoplasm of meninges, unspecified: Secondary | ICD-10-CM

## 2014-01-26 LAB — LIPID PANEL
Cholesterol: 143 mg/dL (ref 0–200)
HDL: 46 mg/dL (ref >39)
LDL Cholesterol: 71 mg/dL (ref 0–99)
Total CHOL/HDL Ratio: 3.1 Ratio
Triglycerides: 128 mg/dL (ref <150)
VLDL: 26 mg/dL (ref 0–40)

## 2014-01-26 LAB — HEPATIC FUNCTION PANEL
ALT: 11 U/L (ref 0–53)
AST: 12 U/L (ref 0–37)
Albumin: 3.7 g/dL (ref 3.5–5.2)
Alkaline Phosphatase: 71 U/L (ref 39–117)
Bilirubin, Direct: 0.1 mg/dL (ref 0.0–0.3)
Indirect Bilirubin: 0.2 mg/dL (ref 0.2–1.2)
Total Bilirubin: 0.3 mg/dL (ref 0.2–1.2)
Total Protein: 5.8 g/dL — ABNORMAL LOW (ref 6.0–8.3)

## 2014-01-26 NOTE — Assessment & Plan Note (Signed)
Pt did not follow through with urology referral. Repeat UA with micro today. If still positive for blood, will refer to urology- discussed that urology referral to evaluate for possible bladder cancer.

## 2014-01-26 NOTE — Assessment & Plan Note (Signed)
This is managed by Dr. Christella Noa.  He has seen Dr. Posey Pronto at [facility] and also had cataract surgery.   No operative plans at this time.

## 2014-01-26 NOTE — Telephone Encounter (Signed)
Faxed both MRIs of brain from 2014.

## 2014-01-26 NOTE — Telephone Encounter (Signed)
Please fax copy of MRI brain to Dr. Posey Pronto at [facility] today. thanks

## 2014-01-26 NOTE — Telephone Encounter (Signed)
Spoke with Dr. Posey Pronto, reviewed MRI brain findings.  Reports that her office is aware and she will review his record.

## 2014-01-26 NOTE — Progress Notes (Signed)
Subjective:    Patient ID: Robert Bates, male    DOB: [birth year], 73 y.o.   MRN: [id number]  HPI  Robert Bates is a 73 yr old male who presents today for follow up.  1) hyperlipidemia-   Lab Results  Component Value Date   CHOL 164 08/02/2011   HDL 45 08/02/2011   LDLCALC 93 08/02/2011   LDLDIRECT 142.2 12/26/2008   TRIG 129 08/02/2011   CHOLHDL 3.6 08/02/2011    2) Meningioma- Pt is followed by Dr. Christella Noa and Dr. Posey Pronto at [facility]. He was in to see the eye doctor" a few weeks ago."  Reports that the has improvement in his blurred vision following cataract surgery. Had cataract surgery in November and then the other eye in December.    3) HTN-  Reports that he had nausea on metoprolol.    4) microscopic hematuria- reports that he did not follow through with urology.   Review of Systems    see HPI  Past Medical History  Diagnosis Date  . GERD (gastroesophageal reflux disease)   . Alcoholism     past problem per pt but admits to still drinking - unclear how much  . Tobacco abuse   . Hematuria     refuses work up or referral - understands risks of morbidity / mortality - 11/2008, 12/2008  . Hyperlipemia   . Kidney stones   . 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 [name].     History   Social History  . Marital Status: Married    Spouse Name: N/A    Number of Children: 2  . Years of Education: N/A   Occupational History  . Retired     Administrator  .     Social History Main Topics  . Smoking status: Current Every Day Smoker -- 1.00 packs/day  . Smokeless tobacco: Former Systems developer    Types: Chew    Quit date: 11/04/1958     Comment: Started smoking at age 17.  Currently smoking 1 ppd.  . Alcohol Use: Yes     Comment: Pt reports rare use  . Drug Use: Not on file  . Sexual Activity: Not on file   Other Topics Concern  . Not on file   Social History Narrative  . No narrative on file    Past Surgical History  Procedure Laterality Date  .  Tonsillectomy    . Hernia repair      Family History  Problem Relation Age of Onset  . Leukemia Father   . Learning disabilities Son   . Leukemia Other   . Stroke Other   . Emphysema Father     Allergies  Allergen Reactions  . Iodine     REACTION: neck swells    Current Outpatient Prescriptions on File Prior to Visit  Medication Sig Dispense Refill  . aspirin EC 81 MG tablet Take 81 mg by mouth daily.      [name] omeprazole (PRILOSEC) 40 MG capsule TAKE 1 CAPSULE BY MOUTH DAILY.  90 capsule  1  . simvastatin (ZOCOR) 40 MG tablet TAKE 1 TABLET (40 MG TOTAL) BY MOUTH AT BEDTIME.  90 tablet  1  . meloxicam (MOBIC) 7.5 MG tablet Take 1 tablet (7.5 mg total) by mouth daily.  14 tablet  0  . metoprolol tartrate (LOPRESSOR) 25 MG tablet TAKE 1 TABLET (25 MG TOTAL) BY MOUTH 2 (TWO) TIMES DAILY. Patient reports only taking once daily 11.21.14      .  traMADol (ULTRAM) 50 MG tablet One tablet every 8 hours as needed for pain.  May cause drowsiness- do not drive after taking this medicine.  20 tablet  0   No current facility-administered medications on file prior to visit.    BP 122/80  Pulse 60  Temp(Src) 97.5 F (36.4 C) (Oral)  Resp 16  Ht 5\' 9"  (1.753 m)  Wt 174 lb (78.926 kg)  BMI 25.68 kg/m2  SpO2 99%    Objective:   Physical Exam  Constitutional: He is oriented to person, place, and time. He appears well-developed and well-nourished. No distress.  HENT:  Head: Normocephalic and atraumatic.  Cardiovascular: Normal rate and regular rhythm.   No murmur heard. Pulmonary/Chest: Effort normal and breath sounds normal. No respiratory distress. He has no wheezes. He has no rales. He exhibits no tenderness.  Musculoskeletal: He exhibits no edema.  Neurological: He is alert and oriented to person, place, and time.  Psychiatric: He has a normal mood and affect. His behavior is normal. Judgment and thought content normal.          Assessment & Plan:  BP stable off of beta  blocker, continue off.

## 2014-01-26 NOTE — Telephone Encounter (Signed)
Relevant patient education mailed to patient.  

## 2014-01-26 NOTE — Patient Instructions (Signed)
Please return to lab for lab work at your earliest convenience. Follow up in 3 months.

## 2014-01-26 NOTE — Progress Notes (Signed)
Pre visit review using our clinic review tool, if applicable. No additional management support is needed unless otherwise documented below in the visit note. 

## 2014-01-27 ENCOUNTER — Telehealth: Payer: Self-pay | Admitting: Family

## 2014-01-27 DIAGNOSIS — R3129 Other microscopic hematuria: Secondary | ICD-10-CM

## 2014-01-27 LAB — URINALYSIS, MICROSCOPIC ONLY
Bacteria, UA: NONE SEEN
Casts: NONE SEEN
Crystals: NONE SEEN
Squamous Epithelial / LPF: NONE SEEN

## 2014-01-27 LAB — URINALYSIS, ROUTINE W REFLEX MICROSCOPIC
Bilirubin Urine: NEGATIVE
Glucose, UA: NEGATIVE mg/dL
Leukocytes, UA: NEGATIVE
Nitrite: NEGATIVE
Protein, ur: NEGATIVE mg/dL
Specific Gravity, Urine: 1.025 (ref 1.005–1.030)
Urobilinogen, UA: 0.2 mg/dL (ref 0.0–1.0)
pH: 5 (ref 5.0–8.0)

## 2014-01-27 NOTE — Telephone Encounter (Signed)
Lab work still showing blood in urine. I would like him to see urology for further evaluation. Cholesterol and liver testing look good.

## 2014-01-28 ENCOUNTER — Telehealth: Payer: Self-pay | Admitting: Family

## 2014-01-28 MED ORDER — MELOXICAM 7.5 MG PO TABS: 7.5000 mg | ORAL_TABLET | Freq: Every day | ORAL | Status: DC

## 2014-01-28 NOTE — Telephone Encounter (Signed)
Results were faxed on 01/26/14 and I refaxed them again.

## 2014-01-28 NOTE — Telephone Encounter (Signed)
Patient is requesting more of the pain medication for this back pain to be sent to [facility] in [place]

## 2014-01-28 NOTE — Telephone Encounter (Signed)
Rx sent for meloxicam.  I did speak with Dr. Posey Pronto at [facility] and she is requesting copy of mri and CT head results. Could you please fax to her? thanks

## 2014-01-28 NOTE — Telephone Encounter (Signed)
Notified pt and he voices understanding. I did not see a urology referral?  Please advise.

## 2014-03-25 ENCOUNTER — Other Ambulatory Visit: Payer: Self-pay | Admitting: Family

## 2014-03-25 MED ORDER — MELOXICAM 7.5 MG PO TABS: 7.5000 mg | ORAL_TABLET | Freq: Every day | ORAL | Status: DC

## 2014-03-25 NOTE — Telephone Encounter (Signed)
Notified pt, he already has f/u on 04/29/14 and will wait until that time to address request.

## 2014-03-25 NOTE — Telephone Encounter (Signed)
No tramadol without OV. Can send rx for meloxicam pended below.

## 2014-03-25 NOTE — Telephone Encounter (Signed)
Please advise.  Medication name:  Name from pharmacy:  traMADol (ULTRAM) 50 MG tablet  TRAMADOL HCL 50 MG TABLET 50 MG TAB The source prescription has been discontinued. Sig: TAKE 1 TABLET BY MOUTH EVERY 8 HOURS AS NEEDED FOR PAIN.MAY CAUSE DROWSINESS DO NOT DRIVE AFTER TAKING THIS MEDICINE Dispense: 20 tablet Refills: 0 Start: 03/25/2014 Class: Normal Requested on: 01/21/2013 Originally ordered on: 05/06/2012 Last refill: 01/21/2013

## 2014-04-29 ENCOUNTER — Ambulatory Visit (INDEPENDENT_AMBULATORY_CARE_PROVIDER_SITE_OTHER): Payer: Medicare Other | Admitting: Family

## 2014-04-29 ENCOUNTER — Encounter: Payer: Self-pay | Admitting: Family

## 2014-04-29 VITALS — BP 100/80 | HR 63 | Temp 97.8°F | Resp 16 | Ht 69.0 in | Wt 170.0 lb

## 2014-04-29 DIAGNOSIS — D320 Benign neoplasm of cerebral meninges: Secondary | ICD-10-CM

## 2014-04-29 DIAGNOSIS — R300 Dysuria: Secondary | ICD-10-CM

## 2014-04-29 DIAGNOSIS — R3129 Other microscopic hematuria: Secondary | ICD-10-CM

## 2014-04-29 DIAGNOSIS — D329 Benign neoplasm of meninges, unspecified: Secondary | ICD-10-CM

## 2014-04-29 DIAGNOSIS — I10 Essential (primary) hypertension: Secondary | ICD-10-CM | POA: Insufficient documentation

## 2014-04-29 LAB — POCT URINALYSIS DIPSTICK
Bilirubin, UA: NEGATIVE
Glucose, UA: NEGATIVE
Ketones, UA: NEGATIVE
Leukocytes, UA: NEGATIVE
Nitrite, UA: NEGATIVE
Protein, UA: NEGATIVE
Spec Grav, UA: 1.01
Urobilinogen, UA: 0.2
pH, UA: 6

## 2014-04-29 NOTE — Progress Notes (Signed)
Pre visit review using our clinic review tool, if applicable. No additional management support is needed unless otherwise documented below in the visit note. 

## 2014-04-29 NOTE — Patient Instructions (Addendum)
Please call Urology to reschedule your Urology appointment- their number is [phone]. Schedule a follow up appointment with Dr. Christella Noa [phone]

## 2014-04-29 NOTE — Progress Notes (Signed)
Subjective:    Patient ID: Robert Bates, male    DOB: Jun 12, 1941, 73 y.o.   MRN: [id number]  HPI  [name] is a 73 yr old male who presents today for follow up.  1) HTN-  He is not currently on meds for BP.  BP Readings from Last 3 Encounters:  04/29/14 100/80  01/26/14 122/80  10/22/13 130/80   2) Hematuria-  Last visit urine was noted to still be + for blood. A referral was made to urology.  ( Dr. Janice Norrie)  3) Meningioma-  Has followed with Dr. Christella Noa.  Reports vision is clear.    Review of Systems    see HPI  Past Medical History  Diagnosis Date  . GERD (gastroesophageal reflux disease)   . Alcoholism     past problem per pt but admits to still drinking - unclear how much  . Tobacco abuse   . Hematuria     refuses work up or referral - understands risks of morbidity / mortality - 11/2008, 12/2008  . Hyperlipemia   . Kidney stones   . 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 [name].     History   Social History  . Marital Status: Married    Spouse Name: N/A    Number of Children: 2  . Years of Education: N/A   Occupational History  . Retired     Administrator  .     Social History Main Topics  . Smoking status: Current Every Day Smoker -- 1.00 packs/day  . Smokeless tobacco: Former Systems developer    Types: Chew    Quit date: 11/04/1958     Comment: Started smoking at age 13.  Currently smoking 1 ppd.  . Alcohol Use: Yes     Comment: Pt reports rare use  . Drug Use: Not on file  . Sexual Activity: Not on file   Other Topics Concern  . Not on file   Social History Narrative  . No narrative on file    Past Surgical History  Procedure Laterality Date  . Tonsillectomy    . Hernia repair      Family History  Problem Relation Age of Onset  . Leukemia Father   . Learning disabilities Son   . Leukemia Other   . Stroke Other   . Emphysema Father     Allergies  Allergen Reactions  . Iodine     REACTION: neck swells    Current Outpatient  Prescriptions on File Prior to Visit  Medication Sig Dispense Refill  . aspirin EC 81 MG tablet Take 81 mg by mouth daily.      . meloxicam (MOBIC) 7.5 MG tablet Take 1 tablet (7.5 mg total) by mouth daily.  15 tablet  0  . omeprazole (PRILOSEC) 40 MG capsule TAKE 1 CAPSULE BY MOUTH DAILY.  90 capsule  1  . simvastatin (ZOCOR) 40 MG tablet TAKE 1 TABLET BY MOUTH AT BEDTIME.  90 tablet  1   No current facility-administered medications on file prior to visit.    BP 100/80  Pulse 63  Temp(Src) 97.8 F (36.6 C) (Oral)  Resp 16  Ht 5\' 9"  (1.753 m)  Wt 170 lb (77.111 kg)  BMI 25.09 kg/m2  SpO2 95%    Objective:   Physical Exam  Constitutional: He is oriented to person, place, and time. He appears well-developed and well-nourished. No distress.  HENT:  Head: Normocephalic and atraumatic.  Cardiovascular: Normal rate and regular  rhythm.   No murmur heard. Pulmonary/Chest: Effort normal and breath sounds normal. No respiratory distress. He has no wheezes. He has no rales. He exhibits no tenderness.  Musculoskeletal: He exhibits no edema.  Lymphadenopathy:    He has no cervical adenopathy.  Neurological: He is alert and oriented to person, place, and time.  Skin: Skin is warm and dry.  Psychiatric: He has a normal mood and affect. His behavior is normal. Judgment and thought content normal.          Assessment & Plan:

## 2014-04-29 NOTE — Assessment & Plan Note (Signed)
Clinically stable. Advised pt to arrange follow up with Dr. Christella Noa.

## 2014-04-29 NOTE — Assessment & Plan Note (Signed)
Pt has been on beta blocker in the past, but is now off of meds.  BP stable. OK to remain off of meds.

## 2014-04-29 NOTE — Assessment & Plan Note (Signed)
Advised pt to reschedule his appointment with urology. Discussed that this can sometimes indicate underlying malignancy and it is important that he follow through with referral.

## 2014-04-30 LAB — URINE CULTURE
Colony Count: NO GROWTH
Organism ID, Bacteria: NO GROWTH

## 2014-05-01 ENCOUNTER — Encounter: Payer: Self-pay | Admitting: Family

## 2014-05-02 ENCOUNTER — Telehealth: Payer: Self-pay | Admitting: Family

## 2014-05-02 NOTE — Telephone Encounter (Signed)
Relevant patient education assigned to patient using Emmi. ° °

## 2014-05-03 ENCOUNTER — Encounter: Payer: Self-pay | Admitting: Family

## 2014-06-20 ENCOUNTER — Other Ambulatory Visit: Payer: Self-pay | Admitting: Family

## 2014-06-20 NOTE — Telephone Encounter (Signed)
Rx sent to pharmacy. Lrd 12/31/13  LOV 04/29/14 LDM

## 2014-07-25 ENCOUNTER — Telehealth: Payer: Self-pay | Admitting: *Deleted

## 2014-07-25 ENCOUNTER — Ambulatory Visit: Payer: Medicare Other | Admitting: Family

## 2014-07-25 ENCOUNTER — Encounter: Payer: Self-pay | Admitting: Family

## 2014-07-25 DIAGNOSIS — Z0289 Encounter for other administrative examinations: Secondary | ICD-10-CM

## 2014-07-25 NOTE — Telephone Encounter (Signed)
Pt did not show for appointment 07/25/2014 at 8:00am for Follow Up

## 2014-07-25 NOTE — Telephone Encounter (Signed)
Letter sent 07/25/2014

## 2014-07-25 NOTE — Telephone Encounter (Signed)
Please send pt no show letter.

## 2014-08-09 ENCOUNTER — Ambulatory Visit (INDEPENDENT_AMBULATORY_CARE_PROVIDER_SITE_OTHER): Payer: Medicare Other | Admitting: Family

## 2014-08-09 ENCOUNTER — Telehealth: Payer: Self-pay | Admitting: *Deleted

## 2014-08-09 ENCOUNTER — Encounter: Payer: Self-pay | Admitting: Family

## 2014-08-09 VITALS — BP 141/76 | HR 60 | Temp 97.7°F | Resp 16 | Ht 69.0 in | Wt 174.0 lb

## 2014-08-09 DIAGNOSIS — Z23 Encounter for immunization: Secondary | ICD-10-CM

## 2014-08-09 DIAGNOSIS — R3129 Other microscopic hematuria: Secondary | ICD-10-CM

## 2014-08-09 DIAGNOSIS — R312 Other microscopic hematuria: Secondary | ICD-10-CM

## 2014-08-09 DIAGNOSIS — M545 Low back pain, unspecified: Secondary | ICD-10-CM

## 2014-08-09 DIAGNOSIS — I10 Essential (primary) hypertension: Secondary | ICD-10-CM

## 2014-08-09 DIAGNOSIS — E785 Hyperlipidemia, unspecified: Secondary | ICD-10-CM

## 2014-08-09 LAB — BASIC METABOLIC PANEL
BUN: 20 mg/dL (ref 6–23)
CO2: 30 mEq/L (ref 19–32)
Calcium: 8.9 mg/dL (ref 8.4–10.5)
Chloride: 104 mEq/L (ref 96–112)
Creatinine, Ser: 1.1 mg/dL (ref 0.4–1.5)
GFR: 71.98 mL/min (ref >60.00)
Glucose, Bld: 96 mg/dL (ref 70–99)
Potassium: 4.2 mEq/L (ref 3.5–5.1)
Sodium: 140 mEq/L (ref 135–145)

## 2014-08-09 LAB — HEPATIC FUNCTION PANEL
ALT: 15 U/L (ref 0–53)
AST: 15 U/L (ref 0–37)
Albumin: 3.7 g/dL (ref 3.5–5.2)
Alkaline Phosphatase: 62 U/L (ref 39–117)
Bilirubin, Direct: 0 mg/dL (ref 0.0–0.3)
Total Bilirubin: 0.5 mg/dL (ref 0.2–1.2)
Total Protein: 6.7 g/dL (ref 6.0–8.3)

## 2014-08-09 MED ORDER — MELOXICAM 7.5 MG PO TABS: 7.5000 mg | ORAL_TABLET | Freq: Every day | ORAL | Status: DC

## 2014-08-09 NOTE — Assessment & Plan Note (Signed)
LDL at goal, continue statin, obtain follow up LFT's.

## 2014-08-09 NOTE — Assessment & Plan Note (Signed)
Continues with intermittent pain, meloxicam is refilled today.

## 2014-08-09 NOTE — Patient Instructions (Signed)
Please complete lab work prior to leaving. Reschedule your appointment with urology. Follow up in 3 months.

## 2014-08-09 NOTE — Telephone Encounter (Signed)
Message copied by Ronny Flurry on Tue Aug 09, 2014 12:01 PM ------      Message from: O'SULLIVAN, MELISSA      Created: Tue Aug 09, 2014  7:27 AM       Could you please request last office note from [facility] ------

## 2014-08-09 NOTE — Assessment & Plan Note (Signed)
Advised pt to arrange follow up with urology. (will request most recent office note).  I did recommend that he follow through with recommended procedure (I suspect this was a cystoscopy) due to the fact that he is a smoker and at increased risk for bladder CA. He verbalizes understanding.

## 2014-08-09 NOTE — Assessment & Plan Note (Signed)
SBP today was 141.  Follow up in 3 months.

## 2014-08-09 NOTE — Progress Notes (Signed)
Pre visit review using our clinic review tool, if applicable. No additional management support is needed unless otherwise documented below in the visit note. 

## 2014-08-09 NOTE — Progress Notes (Signed)
Subjective:    Patient ID: Robert Bates, male    DOB: 07/25/1941, 73 y.o.   MRN: [id number]  Hypertension Pertinent negatives include no chest pain, palpitations or shortness of breath.  Back Pain Pertinent negatives include no chest pain.   Robert Bates is a 73 year old male who presents today for follow up. 1. Hypertension- Blood pressure today is 141/76.  BP Readings from Last 3 Encounters:  08/09/14 141/76  04/29/14 100/80  01/26/14 122/80    2. Hematuria- Was referred to urology last visit.  He was instructed to follow up but has not kept his appointment.  He was advised today to make an appointment given his risk for bladder cancer and being a current smoker.  Discussed with patient the importance of early intervention and importance of keeping appointments.  He says that he will make an appointment.    3. 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 Describes the pain as a tingling and dull on the right side.  Took Advil with no relief.  Would like a refill of his meloxicam today.      Review of Systems  Constitutional: Negative for appetite change, fatigue and unexpected weight change.  HENT: Negative.   Eyes: Negative.   Respiratory: Negative for cough and shortness of breath.   Cardiovascular: Positive for leg swelling. Negative for chest pain and palpitations.  Gastrointestinal: Negative.   Endocrine: Negative.   Genitourinary: Negative for frequency and difficulty urinating.  Musculoskeletal: Positive for back pain.  Skin: Negative.   Neurological: Negative.    Past Medical History  Diagnosis Date  . GERD (gastroesophageal reflux disease)   . Alcoholism     past problem per pt but admits to still drinking - unclear how much  . Tobacco abuse   . Hematuria     refuses work up or referral - understands risks of morbidity / mortality - 11/2008, 12/2008  . Hyperlipemia   . Kidney stones   . 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 [name].      History   Social History  . Marital Status: Married    Spouse Name: N/A    Number of Children: 2  . Years of Education: N/A   Occupational History  . Retired     Administrator  .     Social History Main Topics  . Smoking status: Current Every Day Smoker -- 1.00 packs/day  . Smokeless tobacco: Former Systems developer    Types: Chew    Quit date: 11/04/1958     Comment: Started smoking at age 56.  Currently smoking 1 ppd.  . Alcohol Use: Yes     Comment: Pt reports rare use  . Drug Use: Not on file  . Sexual Activity: Not on file   Other Topics Concern  . Not on file   Social History Narrative  . No narrative on file    Past Surgical History  Procedure Laterality Date  . Tonsillectomy    . Hernia repair      Family History  Problem Relation Age of Onset  . Leukemia Father   . Learning disabilities Son   . Leukemia Other   . Stroke Other   . Emphysema Father     Allergies  Allergen Reactions  . Iodine     REACTION: neck swells    Current Outpatient Prescriptions on File Prior to Visit  Medication Sig Dispense Refill  . aspirin EC 81 MG tablet  Take 81 mg by mouth daily.      [name] omeprazole (PRILOSEC) 40 MG capsule TAKE 1 CAPSULE BY MOUTH DAILY.  90 capsule  1  . simvastatin (ZOCOR) 40 MG tablet TAKE 1 TABLET BY MOUTH AT BEDTIME.  90 tablet  1   No current facility-administered medications on file prior to visit.    BP 141/76  Pulse 60  Temp(Src) 97.7 F (36.5 C) (Oral)  Resp 16  Ht 5\' 9"  (1.753 m)  Wt 174 lb (78.926 kg)  BMI 25.68 kg/m2  SpO2 100%       Objective:   Physical Exam  Constitutional: He is oriented to person, place, and time. He appears well-developed and well-nourished.  HENT:  Head: Normocephalic and atraumatic.  Right Ear: External ear normal.  Left Ear: External ear normal.  Eyes: Pupils are equal, round, and reactive to light.  Neck: Normal range of motion. Neck supple. No JVD present. No thyromegaly present.  Cardiovascular:  Normal rate, regular rhythm, normal heart sounds and intact distal pulses.   No murmur heard. Pulmonary/Chest: Effort normal. No respiratory distress. He has no wheezes. He has no rales.  Abdominal: Soft. Bowel sounds are normal.  Musculoskeletal: Normal range of motion.  Neurological: He is alert and oriented to person, place, and time.  Skin: Skin is warm and dry. No rash noted.  Psychiatric: He has a normal mood and affect.          Assessment & Plan:  Needs liver function testing today and Bmet today.  Instructed to follow up with urology and make an appointment.  Meloxicam refilled for back pain.  Will follow up in 3 months.    I have personally seen and examined patient and agree with Jettie Booze NP student's assessment and plan.

## 2014-08-10 ENCOUNTER — Telehealth: Payer: Self-pay | Admitting: Family

## 2014-08-10 NOTE — Telephone Encounter (Signed)
emmi emailed °

## 2014-08-16 NOTE — Telephone Encounter (Signed)
Spoke with medical records dept at [facility]. She states pt was last seen by their office 05/2012. Pt had appt for 02/2014 but cancelled it. Record is scanned into EPIC from 05/2012.

## 2014-08-16 NOTE — Telephone Encounter (Signed)
Noted  

## 2014-09-27 ENCOUNTER — Encounter: Payer: Self-pay | Admitting: Medical

## 2014-09-27 ENCOUNTER — Ambulatory Visit (INDEPENDENT_AMBULATORY_CARE_PROVIDER_SITE_OTHER): Payer: Medicare Other | Admitting: Medical

## 2014-09-27 ENCOUNTER — Ambulatory Visit (HOSPITAL_BASED_OUTPATIENT_CLINIC_OR_DEPARTMENT_OTHER)
Admission: RE | Admit: 2014-09-27 | Discharge: 2014-09-27 | Disposition: A | Discharge status: Home / Self Care | Payer: Medicare Other | Source: Ambulatory Visit | Attending: Medical | Admitting: Medical

## 2014-09-27 VITALS — BP 122/80 | HR 63 | Temp 98.2°F | Ht 69.0 in | Wt 174.0 lb

## 2014-09-27 DIAGNOSIS — I700 Atherosclerosis of aorta: Secondary | ICD-10-CM | POA: Insufficient documentation

## 2014-09-27 DIAGNOSIS — Z0189 Encounter for other specified special examinations: Secondary | ICD-10-CM

## 2014-09-27 DIAGNOSIS — M5136 Other intervertebral disc degeneration, lumbar region: Secondary | ICD-10-CM | POA: Diagnosis not present

## 2014-09-27 DIAGNOSIS — M545 Low back pain, unspecified: Secondary | ICD-10-CM

## 2014-09-27 DIAGNOSIS — Z86010 Personal history of colonic polyps: Secondary | ICD-10-CM

## 2014-09-27 DIAGNOSIS — R918 Other nonspecific abnormal finding of lung field: Secondary | ICD-10-CM

## 2014-09-27 DIAGNOSIS — Z0000 Encounter for general adult medical examination without abnormal findings: Secondary | ICD-10-CM

## 2014-09-27 DIAGNOSIS — E785 Hyperlipidemia, unspecified: Secondary | ICD-10-CM

## 2014-09-27 LAB — COMPREHENSIVE METABOLIC PANEL
ALT: 13 U/L (ref 0–53)
AST: 15 U/L (ref 0–37)
Albumin: 3.7 g/dL (ref 3.5–5.2)
Alkaline Phosphatase: 66 U/L (ref 39–117)
BUN: 19 mg/dL (ref 6–23)
CO2: 27 mEq/L (ref 19–32)
Calcium: 8.9 mg/dL (ref 8.4–10.5)
Chloride: 101 mEq/L (ref 96–112)
Creatinine, Ser: 0.9 mg/dL (ref 0.4–1.5)
GFR: 90.16 mL/min (ref >60.00)
Glucose, Bld: 86 mg/dL (ref 70–99)
Potassium: 4 mEq/L (ref 3.5–5.1)
Sodium: 138 mEq/L (ref 135–145)
Total Bilirubin: 0.5 mg/dL (ref 0.2–1.2)
Total Protein: 6.6 g/dL (ref 6.0–8.3)

## 2014-09-27 LAB — POCT URINALYSIS DIPSTICK
Bilirubin, UA: NEGATIVE
Glucose, UA: NEGATIVE
Leukocytes, UA: NEGATIVE
Nitrite, UA: NEGATIVE
Spec Grav, UA: 1.015
Urobilinogen, UA: 0.2
pH, UA: 5

## 2014-09-27 LAB — LIPID PANEL
Cholesterol: 162 mg/dL (ref 0–200)
HDL: 45.3 mg/dL (ref >39.00)
LDL Cholesterol: 79 mg/dL (ref 0–99)
NonHDL: 116.7
Total CHOL/HDL Ratio: 4
Triglycerides: 187 mg/dL — ABNORMAL HIGH (ref 0.0–149.0)
VLDL: 37.4 mg/dL (ref 0.0–40.0)

## 2014-09-27 MED ORDER — MELOXICAM 7.5 MG PO TABS: 7.5000 mg | ORAL_TABLET | Freq: Every day | ORAL | Status: DC

## 2014-09-27 NOTE — Progress Notes (Signed)
Subjective:    Patient ID: Robert Bates, male    DOB: [birth year], 73 y.o.   MRN: [id number]  HPI   Pt in for wellness exam.  I have reviewed pt PMH, PSH, FH, Social History and Surgical History  Pt last cholesterol-  3-25- 2015. Total 143. hdl 46 and ldl 71. 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 Not reporting exercise.  Pt had history of Bates glass appearance on chest ct. They want repeat that chest CT this past march 2015.  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 Smoker since 73 yo.  Pt tells me colonosocpy done in 2011. Pt had polyps and told to him to repeat  in 3-5 yrs.  No obstructive urine flow. No hx of prostate problems.  Pt did get his flu vaccine this year. Pneumonia vaccine up to date. Tetanus is up to date. Pt does not want zostovax. He states his cost is too much.    Pt complaint is his lower back. He states hurting past 3 months. Pt has remote history in the past but no chronic intermittent pain. Pt states has been long time since he fell. Fell in 1990's.  Pt states sitting ok. But when he gets up he will have pain. Worse when walking. No pain shooting to his legs. No loss of bladder function and no perineum pain. Pt takes meloxicam 7.5 q day. Helps but not adequate.      Review of Systems See ros smart set.    Objective:   Physical Exam SEE PE smart set.       Assessment & Plan:   Subjective:    Robert Bates is a 73 y.o. male who presents for Medicare Annual/Subsequent preventive examination.   Preventive Screening-Counseling & Management  Tobacco History  Smoking status  . Current Every Day Smoker -- 1.00 packs/day  Smokeless tobacco  . Former Systems developer  . Types: Chew  . Quit date: 11/04/1958    Comment: Started smoking at age 67.  Currently smoking 1 ppd.    Problems Prior to Visit 1. None reported. Except back pain.  Current Problems (verified) Patient Active Problem List   Diagnosis Date Noted  . Wellness examination 09/27/2014  . Hyperlipidemia 09/27/2014  . Back pain 09/27/2014  . HTN (hypertension) 04/29/2014  . Meningioma 10/25/2013  . Low back pain 10/25/2013  . Change in vision 07/28/2013  . Right flank pain 01/21/2013  . 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 Cerumen impaction 08/18/2012  . Abnormal chest CT 08/18/2012  . Kidney stone on left side 05/06/2012  . KERATOSIS 10/09/2010  . SCIATICA, RIGHT 10/09/2010  . UNSPECIFIED HEARING LOSS 05/21/2010  . Hyperlipemia 05/14/2010  . LEUKOCYTOSIS 05/14/2010  . 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 LIPOMA 11/17/2009  . TOBACCO ABUSE 11/24/2008  . MICROSCOPIC HEMATURIA 08/11/2008  . GERD 07/26/2008  . DERMOID CYST 07/25/2008    Medications Prior to Visit Current Outpatient Prescriptions on File Prior to Visit  Medication Sig Dispense Refill  . aspirin EC 81 MG tablet Take 81 mg by mouth daily.    [name] omeprazole (PRILOSEC) 40 MG capsule TAKE 1 CAPSULE BY MOUTH DAILY. 90 capsule 1  . simvastatin (ZOCOR) 40 MG tablet TAKE 1 TABLET BY MOUTH AT BEDTIME. 90 tablet 1   No current facility-administered medications on file prior to visit.    Current Medications (verified) Current Outpatient Prescriptions  Medication Sig Dispense Refill  . aspirin EC 81 MG tablet Take 81 mg by mouth daily.    [name] omeprazole (PRILOSEC) 40 MG capsule TAKE 1 CAPSULE BY MOUTH DAILY. 90 capsule 1  . simvastatin (ZOCOR) 40 MG tablet TAKE 1 TABLET BY MOUTH AT BEDTIME. 90 tablet 1  . meloxicam (MOBIC) 7.5 MG tablet Take 1 tablet (7.5 mg total) by mouth daily. 30 tablet 2   No current facility-administered medications for this visit.     Allergies (verified) Iodine   PAST HISTORY  Family History Family History  Problem Relation Age of Onset  . Leukemia Father   . Learning disabilities Son   . Leukemia Other   . Stroke Other   . Emphysema Father      Social History History  Substance Use Topics  . Smoking status: Current Every Day Smoker -- 1.00 packs/day  . Smokeless tobacco: Former Systems developer    Types: Chew    Quit date: 11/04/1958     Comment: Started smoking at age 4.  Currently smoking 1 ppd.  . Alcohol Use: Yes     Comment: Pt reports rare use    Are there smokers in your home (other than you)?  No  Risk Factors Current exercise habits:  Does not report exercising. Dietary issues discussed:none   Cardiac risk factors: Age, hyperlipidemia and smoking..  Depression Screen (Note: if answer to either of the following is "Yes", a more complete depression screening is indicated)   Q1: Over the past two weeks, have you felt down, depressed or hopeless? No  Q2: Over the past two weeks, have you felt little interest or pleasure in doing things? No  Have youNo lost interest or pleasure in daily life? No  Do you often feel hopeless? No  Do you cry easily over simple problems? No  Activities of Daily Living In your present state of health, do you have any difficulty performing the following activities?:  Driving? no Managing money?  No Feeding yourself? No Getting from bed to chair? No Climbing a flight of stairs? No. Unless back hurts. Preparing food and eating?: No Bathing or showering? No Getting dressed: No Getting to the toilet? No Using the toilet:No Moving around from place to place: No(only when his back hurts) In the past year have you fallen or had a near fall?:No   Are you sexually active?  No  Do you have more than one partner?  No  Hearing Difficulties: No Do you often ask people to speak up or repeat themselves? No Do you experience ringing or noises in your ears? No Do you have difficulty understanding soft or whispered voices? No   Do you feel that you have a problem with memory? no  Do you often misplace items? No(Claifies occasionally misplaces but find items)  Do you feel safe at home?   Yes  Cognitive Testing  Alert? Yes  Normal Appearance?Yes  Oriented to person? Yes  Place? Yes   Time? Yes  Recall of three objects?  Yes  Can perform simple calculations? Yes  Displays appropriate judgment?Yes  Can read the correct time from a watch face?Yes   Advanced Directives have been discussed with the patient? No.  But forms were given to him and explained he will come show to wife fill out get notarized and bring back so we can have on record.   List the Names of Other Physician/Practitioners you currently use: 1.  Mel[facility] pcp.  Indicate any recent Medical  Services you may have received from other than [facility] providers in the past year (date may be approximate).  Immunization History  Administered Date(s) Administered  . Influenza Split 08/02/2011, 08/18/2012  . Influenza Whole 08/17/2008  . Influenza,inj,Quad PF,36+ Mos 07/27/2013, 08/09/2014  . Pneumococcal Conjugate-13 08/09/2014  . Pneumococcal Polysaccharide-23 05/14/2010  . Td 07/25/2008    Screening Tests Health Maintenance  Topic Date Due  . ZOSTAVAX  06/20/2001  . INFLUENZA VACCINE  06/05/2015  . TETANUS/TDAP  07/25/2018  . COLONOSCOPY  12/09/2019  . PNEUMOCOCCAL POLYSACCHARIDE VACCINE AGE 3 AND OVER  Completed    All answers were reviewed with the patient and necessary referrals were made:  Mackie Pai, PA-C   09/27/2014   History reviewed:  He  has a past medical history of GERD (gastroesophageal reflux disease); Alcoholism; Tobacco abuse; Hematuria; Hyperlipemia; Kidney stones; and Meningioma (10/25/2013). He  does not have any pertinent problems on file. He  has past surgical history that includes Tonsillectomy; Hernia repair; and Eye surgery. His family history includes Emphysema in his father; Learning disabilities in his son; Leukemia in his father and other; Stroke in his other. He  reports that he has been smoking.  He quit smokeless tobacco use about 55 years ago. His  smokeless tobacco use included Chew. He reports that he drinks alcohol. His drug history is not on file. He has a current medication list which includes the following prescription(s): aspirin ec, omeprazole, simvastatin, and meloxicam. Current Outpatient Prescriptions on File Prior to Visit  Medication Sig Dispense Refill  . aspirin EC 81 MG tablet Take 81 mg by mouth daily.    [name] omeprazole (PRILOSEC) 40 MG capsule TAKE 1 CAPSULE BY MOUTH DAILY. 90 capsule 1  . simvastatin (ZOCOR) 40 MG tablet TAKE 1 TABLET BY MOUTH AT BEDTIME. 90 tablet 1   No current facility-administered medications on file prior to visit.   He is allergic to iodine.  Review of Systems A comprehensive review of systems was negative except for: back pain   Objective:    Vision by Snellen chart: right DJT:TSVXBLT declines measurement, left JQZ:ESPQZRA declines measurement Blood pressure 122/80, pulse 63, temperature 98.2 F (36.8 C), temperature source Oral, height 5\' 9"  (1.753 m), weight 174 lb (78.926 kg), SpO2 96 %. Body mass index is 25.68 kg/(m^2).  General Mental Status- Alert. Orientation- Oriented x3.  Build and Nutrition- Well nourished and Well Developed.  Skin General:-Normal. Color- Normal color. Moisture- Normal. Temperature-Warm.  HEENT  Ears- Normal. Auditory Canal- Bilateral-Normal. Tympanic Membrane- Bilateral-Normal. Eye Fundi-Bilateral-Normal. Pupil- bilateral- Direct reaction to light normal. Nose & Sinuses- Normal. Nostrils-Bilateral- Normal. Mouth & Throat-Normal.  Neck Neck- No Bruits or Masses. Trachea midline.  Thyroid- Normal.  Chest and Lung Exam Percussion: Quality and Intensity-Percussion normal. Percussion of the chest reveals- No Dullness.  Palpation: Palpation of the chest reveals- Non-tender- No dullness. Auscultation: Breath Sounds- Normal.  Adventitous Sounds:-No adventitious sounds.  Cardiovascular Inspection:- No Heaves. Auscultation:-Normal sinus rhythm  without murmur gallop, S1 WNL and S2 WNL.  Abdomen Inspection:-Inspection Normal. Inspection of the abdomen reveals- No hernias Palpation/Percussion:- Palpation and Percussion of the Abdomen reveal- Non Tender and No Palpable abdominal masses. Liver: Other Characteristics- No hepatomegaly. Spleen:Other Characteristics- No Splenomegaly. Auscultation:- Auscultation of the abdomen reveals- Bowel sounds normal and No Abdominal bruits.  Male Genitourinary Declined by pt.  Rectal Declined by pt.  Peripheral  Vascular Lower Extremity:Inspection- Bilateral-Inspection Normal  Palpation: Femoral pulse- Bilateral- 2+. Popliteal pulse- Bilateral-2+. Dorsalis pedis pulse- Bilateral- 1/2+. Edema- Bilateral- No edema.  Neurologic Mental Status:- Normal. Cranial Nerves:-Normal Bilaterally. Motor:-Normal. Strength:5/5 normal muscle strength-All Muscles. General Assessment of Reflexes: Right Knee-2+. Left Knee- 2+. Coordination-Normal. Gait- Normal.  Meningeal Signs- None.  Lower ext- L5-S1 sensation intact bilaterally, normal reflexes no foot drop.  Musculoskeletal Global Assessment General-Joints show full range of motion without obvious deformity and Normal muscle mass. Strength in upper and lower extremities.  Back- mild rt side paraspinal tenderness to palpation. Pain on straight leg lift.   Lymphatics General lymphatics Description- No generalized lymphadenopathy.    Assessment:    Patient presents for yearly preventative medicine examination. Medicare questionnaire was completed  All immunizations and health maintenance protocols were reviewed with the patient and needed orders were placed.  Appropriate screening laboratory values were ordered for the patient including screening of hyperlipidemia, renal function and hepatic function. If indicated by BPH, a PSA was ordered.  Medication reconciliation,  past medical history, social history, problem list and allergies were  reviewed in detail with the patient  Goals were established with regard to weight loss, exercise, and  diet in compliance with medications  End of life planning was discussed.     Plan:    During the course of the visit the patient was educated and counseled about appropriate screening and preventive services including:    will schedule ct of chest, colonoscopy and get labs.  Diet review for nutrition referral? Yes ____  Not Indicated _x__   Patient Instructions (the written plan) was given to the patient.  Medicare Attestation I have personally reviewed: The patient's medical and social history Their use of alcohol, tobacco or illicit drugs Their current medications and supplements The patient's functional ability including ADLs,fall risks, home safety risks, cognitive, and hearing and visual impairment Diet and physical activities Evidence for depression or mood disorders  The patient's weight, height, BMI, and visual acuity have been recorded in the chart.  I have made referrals, counseling, and provided education to the patient based on review of the above and I have provided the patient with a written personalized care plan for preventive services.     Mackie Pai, PA-C   09/27/2014

## 2014-09-27 NOTE — Progress Notes (Signed)
Pre visit review using our clinic review tool, if applicable. No additional management support is needed unless otherwise documented below in the visit note. 

## 2014-09-27 NOTE — Patient Instructions (Signed)
I will try to schedule your ct of chest and colonosocpy. Those appear over due on your review. We are getting labs today. And for your back pain, I am ordering lumbar xray. And refilling your meloxicam.    Preventive Care for Adults A healthy lifestyle and preventive care can promote health and wellness. Preventive health guidelines for men include the following key practices:  A routine yearly physical is a good way to check with your health care provider about your health and preventative screening. It is a chance to share any concerns and updates on your health and to receive a thorough exam.  Visit your dentist for a routine exam and preventative care every 6 months. Brush your teeth twice a day and floss once a day. Good oral hygiene prevents tooth decay and gum disease.  The frequency of eye exams is based on your age, health, family medical history, use of contact lenses, and other factors. Follow your health care provider's recommendations for frequency of eye exams.  Eat a healthy diet. Foods such as vegetables, fruits, whole grains, low-fat dairy products, and lean protein foods contain the nutrients you need without too many calories. Decrease your intake of foods high in solid fats, added sugars, and salt. Eat the right amount of calories for you.Get information about a proper diet from your health care provider, if necessary.  Regular physical exercise is one of the most important things you can do for your health. Most adults should get at least 150 minutes of moderate-intensity exercise (any activity that increases your heart rate and causes you to sweat) each week. In addition, most adults need muscle-strengthening exercises on 2 or more days a week.  Maintain a healthy weight. The body mass index (BMI) is a screening tool to identify possible weight problems. It provides an estimate of body fat based on height and weight. Your health care provider can find your BMI and can help you  achieve or maintain a healthy weight.For adults 20 years and older:  A BMI below 18.5 is considered underweight.  A BMI of 18.5 to 24.9 is normal.  A BMI of 25 to 29.9 is considered overweight.  A BMI of 30 and above is considered obese.  Maintain normal blood lipids and cholesterol levels by exercising and minimizing your intake of saturated fat. Eat a balanced diet with plenty of fruit and vegetables. Blood tests for lipids and cholesterol should begin at age 45 and be repeated every 5 years. If your lipid or cholesterol levels are high, you are over 50, or you are at high risk for heart disease, you may need your cholesterol levels checked more frequently.Ongoing high lipid and cholesterol levels should be treated with medicines if diet and exercise are not working.  If you smoke, find out from your health care provider how to quit. If you do not use tobacco, do not start.  Lung cancer screening is recommended for adults aged 55-80 years who are at high risk for developing lung cancer because of a history of smoking. A yearly low-dose CT scan of the lungs is recommended for people who have at least a 30-pack-year history of smoking and are a current smoker or have quit within the past 15 years. A pack year of smoking is smoking an average of 1 pack of cigarettes a day for 1 year (for example: 1 pack a day for 30 years or 2 packs a day for 15 years). Yearly screening should continue until the smoker has  stopped smoking for at least 15 years. Yearly screening should be stopped for people who develop a health problem that would prevent them from having lung cancer treatment.  If you choose to drink alcohol, do not have more than 2 drinks per day. One drink is considered to be 12 ounces (355 mL) of beer, 5 ounces (148 mL) of wine, or 1.5 ounces (44 mL) of liquor.  Avoid use of street drugs. Do not share needles with anyone. Ask for help if you need support or instructions about stopping the use of  drugs.  High blood pressure causes heart disease and increases the risk of stroke. Your blood pressure should be checked at least every 1-2 years. Ongoing high blood pressure should be treated with medicines, if weight loss and exercise are not effective.  If you are 67-87 years old, ask your health care provider if you should take aspirin to prevent heart disease.  Diabetes screening involves taking a blood sample to check your fasting blood sugar level. This should be done once every 3 years, after age 62, if you are within normal weight and without risk factors for diabetes. Testing should be considered at a younger age or be carried out more frequently if you are overweight and have at least 1 risk factor for diabetes.  Colorectal cancer can be detected and often prevented. Most routine colorectal cancer screening begins at the age of 57 and continues through age 36. However, your health care provider may recommend screening at an earlier age if you have risk factors for colon cancer. On a yearly basis, your health care provider may provide home test kits to check for hidden blood in the stool. Use of a small camera at the end of a tube to directly examine the colon (sigmoidoscopy or colonoscopy) can detect the earliest forms of colorectal cancer. Talk to your health care provider about this at age 47, when routine screening begins. Direct exam of the colon should be repeated every 5-10 years through age 83, unless early forms of precancerous polyps or small growths are found.  People who are at an increased risk for hepatitis B should be screened for this virus. You are considered at high risk for hepatitis B if:  You were born in a country where hepatitis B occurs often. Talk with your health care provider about which countries are considered high risk.  Your parents were born in a high-risk country and you have not received a shot to protect against hepatitis B (hepatitis B vaccine).  You have  HIV or AIDS.  You use needles to inject street drugs.  You live with, or have sex with, someone who has hepatitis B.  You are a man who has sex with other men (MSM).  You get hemodialysis treatment.  You take certain medicines for conditions such as cancer, organ transplantation, and autoimmune conditions.  Hepatitis C blood testing is recommended for all people born from [birth year] through 1965 and any individual with known risks for hepatitis C.  Practice safe sex. Use condoms and avoid high-risk sexual practices to reduce the spread of sexually transmitted infections (STIs). STIs include gonorrhea, chlamydia, syphilis, trichomonas, herpes, HPV, and human immunodeficiency virus (HIV). Herpes, HIV, and HPV are viral illnesses that have no cure. They can result in disability, cancer, and death.  If you are at risk of being infected with HIV, it is recommended that you take a prescription medicine daily to prevent HIV infection. This is called preexposure prophylaxis (PrEP).  You are considered at risk if:  You are a man who has sex with other men (MSM) and have other risk factors.  You are a heterosexual man, are sexually active, and are at increased risk for HIV infection.  You take drugs by injection.  You are sexually active with a partner who has HIV.  Talk with your health care provider about whether you are at high risk of being infected with HIV. If you choose to begin PrEP, you should first be tested for HIV. You should then be tested every 3 months for as long as you are taking PrEP.  A one-time screening for abdominal aortic aneurysm (AAA) and surgical repair of large AAAs by ultrasound are recommended for men ages 79 to 49 years who are current or former smokers.  Healthy men should no longer receive prostate-specific antigen (PSA) blood tests as part of routine cancer screening. Talk with your health care provider about prostate cancer screening.  Testicular cancer screening is  not recommended for adult males who have no symptoms. Screening includes self-exam, a health care provider exam, and other screening tests. Consult with your health care provider about any symptoms you have or any concerns you have about testicular cancer.  Use sunscreen. Apply sunscreen liberally and repeatedly throughout the day. You should seek shade when your shadow is shorter than you. Protect yourself by wearing long sleeves, pants, a wide-brimmed hat, and sunglasses year round, whenever you are outdoors.  Once a month, do a whole-body skin exam, using a mirror to look at the skin on your back. Tell your health care provider about new moles, moles that have irregular borders, moles that are larger than a pencil eraser, or moles that have changed in shape or color.  Stay current with required vaccines (immunizations).  Influenza vaccine. All adults should be immunized every year.  Tetanus, diphtheria, and acellular pertussis (Td, Tdap) vaccine. An adult who has not previously received Tdap or who does not know his vaccine status should receive 1 dose of Tdap. This initial dose should be followed by tetanus and diphtheria toxoids (Td) booster doses every 10 years. Adults with an unknown or incomplete history of completing a 3-dose immunization series with Td-containing vaccines should begin or complete a primary immunization series including a Tdap dose. Adults should receive a Td booster every 10 years.  Varicella vaccine. An adult without evidence of immunity to varicella should receive 2 doses or a second dose if he has previously received 1 dose.  Human papillomavirus (HPV) vaccine. Males aged 77-21 years who have not received the vaccine previously should receive the 3-dose series. Males aged 22-26 years may be immunized. Immunization is recommended through the age of 45 years for any male who has sex with males and did not get any or all doses earlier. Immunization is recommended for any  person with an immunocompromised condition through the age of 32 years if he did not get any or all doses earlier. During the 3-dose series, the second dose should be obtained 4-8 weeks after the first dose. The third dose should be obtained 24 weeks after the first dose and 16 weeks after the second dose.  Zoster vaccine. One dose is recommended for adults aged 27 years or older unless certain conditions are present.  Measles, mumps, and rubella (MMR) vaccine. Adults born before 20 generally are considered immune to measles and mumps. Adults born in [birth year] or later should have 1 or more doses of MMR vaccine unless there  is a contraindication to the vaccine or there is laboratory evidence of immunity to each of the three diseases. A routine second dose of MMR vaccine should be obtained at least 28 days after the first dose for students attending postsecondary schools, health care workers, or international travelers. People who received inactivated measles vaccine or an unknown type of measles vaccine during 1963-1967 should receive 2 doses of MMR vaccine. People who received inactivated mumps vaccine or an unknown type of mumps vaccine before 1979 and are at high risk for mumps infection should consider immunization with 2 doses of MMR vaccine. Unvaccinated health care workers born before 55 who lack laboratory evidence of measles, mumps, or rubella immunity or laboratory confirmation of disease should consider measles and mumps immunization with 2 doses of MMR vaccine or rubella immunization with 1 dose of MMR vaccine.  Pneumococcal 13-valent conjugate (PCV13) vaccine. When indicated, a person who is uncertain of his immunization history and has no record of immunization should receive the PCV13 vaccine. An adult aged 20 years or older who has certain medical conditions and has not been previously immunized should receive 1 dose of PCV13 vaccine. This PCV13 should be followed with a dose of pneumococcal  polysaccharide (PPSV23) vaccine. The PPSV23 vaccine dose should be obtained at least 8 weeks after the dose of PCV13 vaccine. An adult aged 49 years or older who has certain medical conditions and previously received 1 or more doses of PPSV23 vaccine should receive 1 dose of PCV13. The PCV13 vaccine dose should be obtained 1 or more years after the last PPSV23 vaccine dose.  Pneumococcal polysaccharide (PPSV23) vaccine. When PCV13 is also indicated, PCV13 should be obtained first. All adults aged 2 years and older should be immunized. An adult younger than age 34 years who has certain medical conditions should be immunized. Any person who resides in a nursing home or long-term care facility should be immunized. An adult smoker should be immunized. People with an immunocompromised condition and certain other conditions should receive both PCV13 and PPSV23 vaccines. People with human immunodeficiency virus (HIV) infection should be immunized as soon as possible after diagnosis. Immunization during chemotherapy or radiation therapy should be avoided. Routine use of PPSV23 vaccine is not recommended for American Indians, [place] Natives, or people younger than 65 years unless there are medical conditions that require PPSV23 vaccine. When indicated, people who have unknown immunization and have no record of immunization should receive PPSV23 vaccine. One-time revaccination 5 years after the first dose of PPSV23 is recommended for people aged 19-64 years who have chronic kidney failure, nephrotic syndrome, asplenia, or immunocompromised conditions. People who received 1-2 doses of PPSV23 before age 21 years should receive another dose of PPSV23 vaccine at age 53 years or later if at least 5 years have passed since the previous dose. Doses of PPSV23 are not needed for people immunized with PPSV23 at or after age 50 years.  Meningococcal vaccine. Adults with asplenia or persistent complement component deficiencies  should receive 2 doses of quadrivalent meningococcal conjugate (MenACWY-D) vaccine. The doses should be obtained at least 2 months apart. Microbiologists working with certain meningococcal bacteria, [place] recruits, people at risk during an outbreak, and people who travel to or live in countries with a high rate of meningitis should be immunized. A first-year college student up through age 32 years who is living in a residence hall should receive a dose if he did not receive a dose on or after his 16th birthday. Adults who  have certain high-risk conditions should receive one or more doses of vaccine.  Hepatitis A vaccine. Adults who wish to be protected from this disease, have certain high-risk conditions, work with hepatitis A-infected animals, work in hepatitis A research labs, or travel to or work in countries with a high rate of hepatitis A should be immunized. Adults who were previously unvaccinated and who anticipate close contact with an international adoptee during the first 60 days after arrival in the [place] from a country with a high rate of hepatitis A should be immunized.  Hepatitis B vaccine. Adults should be immunized if they wish to be protected from this disease, have certain high-risk conditions, may be exposed to blood or other infectious body fluids, are household contacts or sex partners of hepatitis B positive people, are clients or workers in certain care facilities, or travel to or work in countries with a high rate of hepatitis B.  Haemophilus influenzae type b (Hib) vaccine. A previously unvaccinated person with asplenia or sickle cell disease or having a scheduled splenectomy should receive 1 dose of Hib vaccine. Regardless of previous immunization, a recipient of a hematopoietic stem cell transplant should receive a 3-dose series 6-12 months after his successful transplant. Hib vaccine is not recommended for adults with HIV infection. Preventive Service / Frequency Ages  32 to 63  Blood pressure check.** / Every 1 to 2 years.  Lipid and cholesterol check.** / Every 5 years beginning at age 57.  Hepatitis C blood test.** / For any individual with known risks for hepatitis C.  Skin self-exam. / Monthly.  Influenza vaccine. / Every year.  Tetanus, diphtheria, and acellular pertussis (Tdap, Td) vaccine.** / Consult your health care provider. 1 dose of Td every 10 years.  Varicella vaccine.** / Consult your health care provider.  HPV vaccine. / 3 doses over 6 months, if 34 or younger.  Measles, mumps, rubella (MMR) vaccine.** / You need at least 1 dose of MMR if you were born in 1957 or later. You may also need a second dose.  Pneumococcal 13-valent conjugate (PCV13) vaccine.** / Consult your health care provider.  Pneumococcal polysaccharide (PPSV23) vaccine.** / 1 to 2 doses if you smoke cigarettes or if you have certain conditions.  Meningococcal vaccine.** / 1 dose if you are age 6 to 76 years and a Market researcher living in a residence hall, or have one of several medical conditions. You may also need additional booster doses.  Hepatitis A vaccine.** / Consult your health care provider.  Hepatitis B vaccine.** / Consult your health care provider.  Haemophilus influenzae type b (Hib) vaccine.** / Consult your health care provider. Ages 41 to 46  Blood pressure check.** / Every 1 to 2 years.  Lipid and cholesterol check.** / Every 5 years beginning at age 21.  Lung cancer screening. / Every year if you are aged 32-80 years and have a 30-pack-year history of smoking and currently smoke or have quit within the past 15 years. Yearly screening is stopped once you have quit smoking for at least 15 years or develop a health problem that would prevent you from having lung cancer treatment.  Fecal occult blood test (FOBT) of stool. / Every year beginning at age 82 and continuing until age 30. You may not have to do this test if you get a  colonoscopy every 10 years.  Flexible sigmoidoscopy** or colonoscopy.** / Every 5 years for a flexible sigmoidoscopy or every 10 years for a colonoscopy beginning  at age 74 and continuing until age 28.  Hepatitis C blood test.** / For all people born from [birth year] through 1965 and any individual with known risks for hepatitis C.  Skin self-exam. / Monthly.  Influenza vaccine. / Every year.  Tetanus, diphtheria, and acellular pertussis (Tdap/Td) vaccine.** / Consult your health care provider. 1 dose of Td every 10 years.  Varicella vaccine.** / Consult your health care provider.  Zoster vaccine.** / 1 dose for adults aged 38 years or older.  Measles, mumps, rubella (MMR) vaccine.** / You need at least 1 dose of MMR if you were born in 1957 or later. You may also need a second dose.  Pneumococcal 13-valent conjugate (PCV13) vaccine.** / Consult your health care provider.  Pneumococcal polysaccharide (PPSV23) vaccine.** / 1 to 2 doses if you smoke cigarettes or if you have certain conditions.  Meningococcal vaccine.** / Consult your health care provider.  Hepatitis A vaccine.** / Consult your health care provider.  Hepatitis B vaccine.** / Consult your health care provider.  Haemophilus influenzae type b (Hib) vaccine.** / Consult your health care provider. Ages 78 and over  Blood pressure check.** / Every 1 to 2 years.  Lipid and cholesterol check.**/ Every 5 years beginning at age 4.  Lung cancer screening. / Every year if you are aged 46-80 years and have a 30-pack-year history of smoking and currently smoke or have quit within the past 15 years. Yearly screening is stopped once you have quit smoking for at least 15 years or develop a health problem that would prevent you from having lung cancer treatment.  Fecal occult blood test (FOBT) of stool. / Every year beginning at age 21 and continuing until age 79. You may not have to do this test if you get a colonoscopy every 10  years.  Flexible sigmoidoscopy** or colonoscopy.** / Every 5 years for a flexible sigmoidoscopy or every 10 years for a colonoscopy beginning at age 64 and continuing until age 45.  Hepatitis C blood test.** / For all people born from [birth year] through 1965 and any individual with known risks for hepatitis C.  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AAA) screening.** / A one-time screening for ages 14 to 9 years who are current or former smokers.  Skin self-exam. / Monthly.  Influenza vaccine. / Every year.  Tetanus, diphtheria, and acellular pertussis (Tdap/Td) vaccine.** / 1 dose of Td every 10 years.  Varicella vaccine.** / Consult your health care provider.  Zoster vaccine.** / 1 dose for adults aged 67 years or older.  Pneumococcal 13-valent conjugate (PCV13) vaccine.** / Consult your health care provider.  Pneumococcal polysaccharide (PPSV23) vaccine.** / 1 dose for all adults aged 93 years and older.  Meningococcal vaccine.** / Consult your health care provider.  Hepatitis A vaccine.** / Consult your health care provider.  Hepatitis B vaccine.** / Consult your health care provider.  Haemophilus influenzae type b (Hib) vaccine.** / Consult your health care provider. **Family history and personal history of risk and conditions may change your health care provider's recommendations. Document Released: 12/17/2001 Document Revised: 10/26/2013 Document Reviewed: 03/18/2011 [facility] Patient Information 2015 [place], [place]. This information is not intended to replace advice given to you by your health care provider. Make sure you discuss any questions you have with your health care provider.

## 2014-09-30 ENCOUNTER — Ambulatory Visit (HOSPITAL_BASED_OUTPATIENT_CLINIC_OR_DEPARTMENT_OTHER): Payer: Medicare Other

## 2014-10-09 ENCOUNTER — Ambulatory Visit (HOSPITAL_BASED_OUTPATIENT_CLINIC_OR_DEPARTMENT_OTHER)
Admission: RE | Admit: 2014-10-09 | Discharge: 2014-10-09 | Disposition: A | Discharge status: Home / Self Care | Payer: Medicare Other | Source: Ambulatory Visit | Attending: Medical | Admitting: Medical

## 2014-10-09 DIAGNOSIS — K769 Liver disease, unspecified: Secondary | ICD-10-CM | POA: Diagnosis not present

## 2014-10-09 DIAGNOSIS — I7090 Unspecified atherosclerosis: Secondary | ICD-10-CM | POA: Insufficient documentation

## 2014-10-09 DIAGNOSIS — I700 Atherosclerosis of aorta: Secondary | ICD-10-CM | POA: Diagnosis not present

## 2014-10-09 DIAGNOSIS — R05 Cough: Secondary | ICD-10-CM | POA: Insufficient documentation

## 2014-10-09 DIAGNOSIS — I2510 Atherosclerotic heart disease of native coronary artery without angina pectoris: Secondary | ICD-10-CM | POA: Diagnosis not present

## 2014-10-09 DIAGNOSIS — R911 Solitary pulmonary nodule: Secondary | ICD-10-CM | POA: Insufficient documentation

## 2014-10-09 DIAGNOSIS — K8020 Calculus of gallbladder without cholecystitis without obstruction: Secondary | ICD-10-CM | POA: Diagnosis not present

## 2014-10-09 DIAGNOSIS — J432 Centrilobular emphysema: Secondary | ICD-10-CM | POA: Insufficient documentation

## 2014-10-09 DIAGNOSIS — R918 Other nonspecific abnormal finding of lung field: Secondary | ICD-10-CM

## 2014-10-09 DIAGNOSIS — J438 Other emphysema: Secondary | ICD-10-CM | POA: Diagnosis not present

## 2014-10-09 DIAGNOSIS — I77819 Aortic ectasia, unspecified site: Secondary | ICD-10-CM | POA: Insufficient documentation

## 2014-10-09 DIAGNOSIS — F17210 Nicotine dependence, cigarettes, uncomplicated: Secondary | ICD-10-CM | POA: Diagnosis present

## 2014-10-11 ENCOUNTER — Telehealth: Payer: Self-pay | Admitting: Medical

## 2014-10-11 DIAGNOSIS — R911 Solitary pulmonary nodule: Secondary | ICD-10-CM

## 2014-10-11 DIAGNOSIS — K769 Liver disease, unspecified: Secondary | ICD-10-CM

## 2014-10-11 NOTE — Telephone Encounter (Signed)
Pt has ground glass appearing nodule of lt upper lobe. Putting in order to repeat ct chest in 3 months. Also pt has new hepatic lesion. Will order a mri of abdomen with and without contrast at radiologist request.

## 2014-10-12 ENCOUNTER — Ambulatory Visit: Payer: Medicare Other | Admitting: Family

## 2014-10-12 NOTE — Telephone Encounter (Signed)
Spoke with patient. Given results. MRI scheduling and coverage by insurance in process.

## 2014-10-12 NOTE — Telephone Encounter (Signed)
Caller name: Koltyn, Kelsay Relation to pt: self  Call back number: [phone]   Reason for call:  Pt inquring about test results please call home [phone]

## 2014-10-14 ENCOUNTER — Telehealth: Payer: Self-pay | Admitting: Family

## 2014-10-14 DIAGNOSIS — R911 Solitary pulmonary nodule: Secondary | ICD-10-CM

## 2014-10-14 NOTE — Telephone Encounter (Signed)
Please contact pt and let him know that I also reviewed his CT chest.  I would like to get him back in to see Dr. Joya Gaskins- pulmonology since he has seen him for this previously.

## 2014-10-14 NOTE — Telephone Encounter (Signed)
Notified pt and he is agreeable to proceed with referral. 

## 2014-10-15 ENCOUNTER — Other Ambulatory Visit (HOSPITAL_BASED_OUTPATIENT_CLINIC_OR_DEPARTMENT_OTHER): Payer: Medicare Other

## 2014-10-18 ENCOUNTER — Ambulatory Visit: Payer: Medicare Other | Admitting: Family

## 2014-10-19 ENCOUNTER — Ambulatory Visit: Payer: Medicare Other | Admitting: Critical Care Medicine

## 2014-10-24 ENCOUNTER — Ambulatory Visit: Payer: Medicare Other | Admitting: Family

## 2014-10-24 ENCOUNTER — Telehealth: Payer: Self-pay | Admitting: Family

## 2014-10-24 NOTE — Telephone Encounter (Signed)
Pts wife is calling and request to speak to nurse about MRI

## 2014-10-24 NOTE — Telephone Encounter (Signed)
Ok to keep 1/20 apt.

## 2014-10-24 NOTE — Telephone Encounter (Signed)
Spoke with pt. He states he missed appt because he was still stuck in [place] and was unable to keep his appt. Pt's wife already rescheduled appt for 11/07/14. Is it ok for pt to wait until then for f/u? He has MRI abdomen scheduled for Wednesday and has consultation with Dr Joya Gaskins on 11/23/14. Has not seen urology yet.

## 2014-10-24 NOTE — Telephone Encounter (Signed)
Please contact pt to reschedule appointment.

## 2014-10-26 ENCOUNTER — Ambulatory Visit (HOSPITAL_BASED_OUTPATIENT_CLINIC_OR_DEPARTMENT_OTHER)
Admission: RE | Admit: 2014-10-26 | Discharge: 2014-10-26 | Disposition: A | Discharge status: Home / Self Care | Payer: Medicare Other | Source: Ambulatory Visit | Attending: Medical | Admitting: Medical

## 2014-10-26 DIAGNOSIS — K7689 Other specified diseases of liver: Secondary | ICD-10-CM | POA: Insufficient documentation

## 2014-10-26 DIAGNOSIS — K769 Liver disease, unspecified: Secondary | ICD-10-CM

## 2014-11-04 DIAGNOSIS — C801 Malignant (primary) neoplasm, unspecified: Secondary | ICD-10-CM

## 2014-11-04 HISTORY — DX: Malignant (primary) neoplasm, unspecified: C80.1

## 2014-11-07 ENCOUNTER — Ambulatory Visit (INDEPENDENT_AMBULATORY_CARE_PROVIDER_SITE_OTHER): Payer: Medicare Other | Admitting: Family

## 2014-11-07 ENCOUNTER — Encounter: Payer: Self-pay | Admitting: Family

## 2014-11-07 VITALS — BP 130/86 | HR 69 | Temp 97.5°F | Resp 16 | Ht 69.0 in | Wt 175.2 lb

## 2014-11-07 DIAGNOSIS — R911 Solitary pulmonary nodule: Secondary | ICD-10-CM

## 2014-11-07 DIAGNOSIS — K7689 Other specified diseases of liver: Secondary | ICD-10-CM | POA: Insufficient documentation

## 2014-11-07 DIAGNOSIS — R312 Other microscopic hematuria: Secondary | ICD-10-CM

## 2014-11-07 DIAGNOSIS — Z85118 Personal history of other malignant neoplasm of bronchus and lung: Secondary | ICD-10-CM | POA: Insufficient documentation

## 2014-11-07 DIAGNOSIS — R3129 Other microscopic hematuria: Secondary | ICD-10-CM

## 2014-11-07 DIAGNOSIS — H6692 Otitis media, unspecified, left ear: Secondary | ICD-10-CM | POA: Insufficient documentation

## 2014-11-07 DIAGNOSIS — C3490 Malignant neoplasm of unspecified part of unspecified bronchus or lung: Secondary | ICD-10-CM | POA: Insufficient documentation

## 2014-11-07 MED ORDER — AMOXICILLIN 500 MG PO CAPS: 1000.0000 mg | ORAL_CAPSULE | Freq: Three times a day (TID) | ORAL | Status: DC

## 2014-11-07 NOTE — Patient Instructions (Addendum)
Schedule a follow up appointment with Dr. Janice Norrie [phone] Keep upcoming appointment with Dr. Joya Gaskins. You need to quit smoking.  Add mucinex 600mg  twice daily as needed for chest congestion.  Start amoxicillin for your ear infection. Call if symptoms worsen or if they do not improve.

## 2014-11-07 NOTE — Progress Notes (Signed)
Subjective:    Patient ID: Robert Bates, male    DOB: November 01, 1941, 74 y.o.   MRN: [id number]  HPI  Robert Bates is a 74 yr old male who presents today for follow up.  1) Lung mass- CT of the chest was performed and noted progressive interval enlargement of a 2.4-2.7 cm LUL nodule. He is scheduled for pulmonary consult on 1/20. Reports that he has + cough and + chest congestion. Denies fever. Had a "head cold" last week which has resolved.  Reports that he smokes about 1/2 PPD.   2) Hepatic lesions- note was also made on CT chest incidentally of some indeterminate hepatic lesions.  MRI of the abdomen was recommended. Pt completed mri on 12/23 and MRI noted multiple benign hepatic cysts as well as 2 non-enhancing renal cysts  3) Hematuria- he was advised to arrange follow up with urology. Denies gross hematuria. He has seen Dr. Janice Norrie.  He does not want to have recommended cystoscopy.  Declines further work up at this time.   4) Left ear pain- reports left ear has been bothering him. Pain worse if cold air.  Review of Systems See HPI  Past Medical History  Diagnosis Date  . GERD (gastroesophageal reflux disease)   . Alcoholism     past problem per pt but admits to still drinking - unclear how much  . Tobacco abuse   . Hematuria     refuses work up or referral - understands risks of morbidity / mortality - 11/2008, 12/2008  . Hyperlipemia   . Kidney stones   . 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 [name].     History   Social History  . Marital Status: Married    Spouse Name: N/A    Number of Children: 2  . Years of Education: N/A   Occupational History  . Retired     Administrator  .     Social History Main Topics  . Smoking status: Current Every Day Smoker -- 1.00 packs/day  . Smokeless tobacco: Former Systems developer    Types: Chew    Quit date: 11/04/1958     Comment: Started smoking at age 42.  Currently smoking 1 ppd.  . Alcohol Use: Yes     Comment: Pt reports rare  use  . Drug Use: Not on file  . Sexual Activity: Not on file   Other Topics Concern  . Not on file   Social History Narrative    Past Surgical History  Procedure Laterality Date  . Tonsillectomy    . Hernia repair    . Eye surgery      Cataracts removed    Family History  Problem Relation Age of Onset  . Leukemia Father   . Learning disabilities Son   . Leukemia Other   . Stroke Other   . Emphysema Father     Allergies  Allergen Reactions  . Iodine     REACTION: neck swells    Current Outpatient Prescriptions on File Prior to Visit  Medication Sig Dispense Refill  . aspirin EC 81 MG tablet Take 81 mg by mouth daily.    . meloxicam (MOBIC) 7.5 MG tablet Take 1 tablet (7.5 mg total) by mouth daily. 30 tablet 2  . omeprazole (PRILOSEC) 40 MG capsule TAKE 1 CAPSULE BY MOUTH DAILY. 90 capsule 1  . simvastatin (ZOCOR) 40 MG tablet TAKE 1 TABLET BY MOUTH AT BEDTIME. 90 tablet 1   No current facility-administered  medications on file prior to visit.    BP 130/86 mmHg  Pulse 69  Temp(Src) 97.5 F (36.4 C) (Oral)  Resp 16  Ht 5\' 9"  (1.753 m)  Wt 175 lb 3.2 oz (79.47 kg)  BMI 25.86 kg/m2  SpO2 98%       Objective:   Physical Exam  Constitutional: He is oriented to person, place, and time. He appears well-developed and well-nourished. No distress.  HENT:  Head: Normocephalic and atraumatic.  Right Ear: Tympanic membrane and ear canal normal.  Left Ear: Ear canal normal.  L TM mild erythema is noted.   Cardiovascular: Normal rate and regular rhythm.   No murmur heard. Pulmonary/Chest: Effort normal and breath sounds normal. No respiratory distress. He has no wheezes. He has no rales. He exhibits no tenderness.  Musculoskeletal: He exhibits no edema.  Lymphadenopathy:    He has no cervical adenopathy.  Neurological: He is alert and oriented to person, place, and time.  Psychiatric: He has a normal mood and affect. His behavior is normal. Judgment and thought  content normal.          Assessment & Plan:  Advised pt to follow up in 3 months.

## 2014-11-07 NOTE — Assessment & Plan Note (Signed)
Appear benign per MRI.

## 2014-11-07 NOTE — Assessment & Plan Note (Signed)
Advised follow up with urology and that he follow through with recommended cystoscopy.

## 2014-11-07 NOTE — Progress Notes (Signed)
Pre visit review using our clinic review tool, if applicable. No additional management support is needed unless otherwise documented below in the visit note. 

## 2014-11-07 NOTE — Assessment & Plan Note (Signed)
Enlarging- concerning give his smoking hx. We discussed importance of keeping his follow up with Pulmonology.

## 2014-11-07 NOTE — Assessment & Plan Note (Signed)
Will rx with amoxicillin.

## 2014-11-08 ENCOUNTER — Ambulatory Visit: Payer: Medicare Other | Admitting: Family

## 2014-11-23 ENCOUNTER — Ambulatory Visit: Payer: Medicare Other | Admitting: Critical Care Medicine

## 2014-12-05 ENCOUNTER — Ambulatory Visit: Payer: Self-pay | Admitting: Critical Care Medicine

## 2014-12-06 ENCOUNTER — Encounter: Payer: Self-pay | Admitting: Family

## 2014-12-29 ENCOUNTER — Other Ambulatory Visit: Payer: Self-pay | Admitting: Family

## 2015-02-06 ENCOUNTER — Telehealth: Payer: Self-pay | Admitting: Family

## 2015-02-06 ENCOUNTER — Ambulatory Visit (INDEPENDENT_AMBULATORY_CARE_PROVIDER_SITE_OTHER): Payer: Medicare Other | Admitting: Family

## 2015-02-06 ENCOUNTER — Telehealth: Payer: Self-pay | Admitting: Medical

## 2015-02-06 ENCOUNTER — Encounter: Payer: Self-pay | Admitting: Family

## 2015-02-06 VITALS — BP 130/70 | HR 68 | Temp 97.7°F | Resp 16 | Ht 69.0 in | Wt 170.8 lb

## 2015-02-06 DIAGNOSIS — M542 Cervicalgia: Secondary | ICD-10-CM

## 2015-02-06 DIAGNOSIS — M79643 Pain in unspecified hand: Secondary | ICD-10-CM | POA: Diagnosis not present

## 2015-02-06 DIAGNOSIS — R312 Other microscopic hematuria: Secondary | ICD-10-CM | POA: Diagnosis not present

## 2015-02-06 DIAGNOSIS — R911 Solitary pulmonary nodule: Secondary | ICD-10-CM

## 2015-02-06 DIAGNOSIS — D329 Benign neoplasm of meninges, unspecified: Secondary | ICD-10-CM

## 2015-02-06 DIAGNOSIS — R3129 Other microscopic hematuria: Secondary | ICD-10-CM

## 2015-02-06 MED ORDER — PRAVASTATIN SODIUM 80 MG PO TABS: 80.0000 mg | ORAL_TABLET | Freq: Every day | ORAL | Status: DC

## 2015-02-06 MED ORDER — MELOXICAM 7.5 MG PO TABS: 7.5000 mg | ORAL_TABLET | Freq: Every day | ORAL | Status: DC

## 2015-02-06 NOTE — Assessment & Plan Note (Signed)
Trial of meloxicam. Consider ortho referral if no improvement.

## 2015-02-06 NOTE — Patient Instructions (Addendum)
Stop zocor (simvastatin), start pravachol (pravastatin) once daily to see if this helps with your neck pain. Please contact urology to arrange follow up: Dr. Janice Norrie [phone]-  He needs to make sure that the blood in your urine is not due to a cancer in your bladder.  Please contact Dr. Joya Gaskins to arrange follow up: [phone]- he needs to make sure that the lung nodule is not a growing lung cancer.   Restart meloxicam for hand/neck pain.  Follow up in 1 month.

## 2015-02-06 NOTE — Assessment & Plan Note (Signed)
Advised pt to follow up with urology to complete recommended cysto- advised need to rule out underlying malignancy.

## 2015-02-06 NOTE — Progress Notes (Signed)
Subjective:    Patient ID: Robert Bates, male    DOB: 13-Oct-1941, 74 y.o.   MRN: [id number]  HPI  Robert Bates is a 74 yr old male who presents today for follow up.  Lung nodule- pt cancelled his pulmonary appointment.   R sided neck pain- denies associated numbness/tingling/pain.  Resolves when he does not take his zocor.    Reports intermittent left hand pain- center of the left hand. Denies numbness/tingling of pain in his left fingers. Reports pain is 10/10 with driving. Then eases off when he is not driving  Lab Results  Component Value Date   CHOL 162 09/27/2014   HDL 45.30 09/27/2014   LDLCALC 79 09/27/2014   LDLDIRECT 142.2 12/26/2008   TRIG 187.0* 09/27/2014   CHOLHDL 4 09/27/2014   Hematuria- has not followed through with urology recommendation.  Continues to smoke down from 2-3 packs a day.     Review of Systems    see HPI  Past Medical History  Diagnosis Date  . GERD (gastroesophageal reflux disease)   . Alcoholism     past problem per pt but admits to still drinking - unclear how much  . Tobacco abuse   . Hematuria     refuses work up or referral - understands risks of morbidity / mortality - 11/2008, 12/2008  . Hyperlipemia   . Kidney stones   . 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 [name].     History   Social History  . Marital Status: Married    Spouse Name: N/A  . Number of Children: 2  . Years of Education: N/A   Occupational History  . Retired     Administrator  .     Social History Main Topics  . Smoking status: Current Every Day Smoker -- 1.00 packs/day  . Smokeless tobacco: Former Systems developer    Types: Chew    Quit date: 11/04/1958     Comment: Started smoking at age 61.  Currently smoking 1 ppd.  . Alcohol Use: Yes     Comment: Pt reports rare use  . Drug Use: Not on file  . Sexual Activity: Not on file   Other Topics Concern  . Not on file   Social History Narrative    Past Surgical History  Procedure Laterality Date    . Tonsillectomy    . Hernia repair    . Eye surgery      Cataracts removed    Family History  Problem Relation Age of Onset  . Leukemia Father   . Learning disabilities Son   . Leukemia Other   . Stroke Other   . Emphysema Father     Allergies  Allergen Reactions  . Iodine     REACTION: neck swells    Current Outpatient Prescriptions on File Prior to Visit  Medication Sig Dispense Refill  . aspirin EC 81 MG tablet Take 81 mg by mouth daily.    . meloxicam (MOBIC) 7.5 MG tablet Take 1 tablet (7.5 mg total) by mouth daily. 30 tablet 2  . omeprazole (PRILOSEC) 40 MG capsule TAKE 1 CAPSULE BY MOUTH DAILY. 90 capsule 1   No current facility-administered medications on file prior to visit.    BP 130/70 mmHg  Pulse 68  Temp(Src) 97.7 F (36.5 C) (Oral)  Resp 16  Ht 5\' 9"  (1.753 m)  Wt 170 lb 12.8 oz (77.474 kg)  BMI 25.21 kg/m2  SpO2 96%    Objective:  Physical Exam  Constitutional: He is oriented to person, place, and time. He appears well-developed and well-nourished. No distress.  HENT:  Head: Normocephalic and atraumatic.  Cardiovascular: Normal rate and regular rhythm.   No murmur heard. Pulmonary/Chest: Effort normal and breath sounds normal. No respiratory distress. He has no wheezes. He has no rales.  Musculoskeletal: He exhibits no edema.  Neg tinnel, neg phalan no hand swelling noted  Neurological: He is alert and oriented to person, place, and time.  Skin: Skin is warm and dry.  Psychiatric: He has a normal mood and affect. His behavior is normal. Thought content normal.          Assessment & Plan:

## 2015-02-06 NOTE — Telephone Encounter (Signed)
Thank you :)

## 2015-02-06 NOTE — Assessment & Plan Note (Addendum)
Trial of meloxicam. Stop zocor (simvastatin), start pravachol (pravastatin) once daily to see if this helps with your neck pain.

## 2015-02-06 NOTE — Telephone Encounter (Signed)
Unable to leave message on voicemail. Will try later.

## 2015-02-06 NOTE — Assessment & Plan Note (Signed)
Recommended to patient that he reschedule his pulmonary appointment. Advised pt of the possibility of underlying malignancy and need to follow through. I also advised pt to quit smoking.

## 2015-02-06 NOTE — Telephone Encounter (Signed)
Notified pt and he voices understanding. 

## 2015-02-06 NOTE — Telephone Encounter (Signed)
Please also advise pt to follow up with Dr. [name] re: mengioma monitoring.

## 2015-02-06 NOTE — Telephone Encounter (Signed)
Insurance denied ct to be done at 3 months. They will approve at 12 months.

## 2015-02-07 NOTE — Assessment & Plan Note (Signed)
Advised pt to arrange follow up with Dr. Christella Noa.

## 2015-03-10 ENCOUNTER — Ambulatory Visit: Payer: Medicare Other | Admitting: Family

## 2015-06-19 ENCOUNTER — Ambulatory Visit (INDEPENDENT_AMBULATORY_CARE_PROVIDER_SITE_OTHER): Payer: Medicare Other | Admitting: Family

## 2015-06-19 ENCOUNTER — Encounter: Payer: Self-pay | Admitting: Family

## 2015-06-19 VITALS — BP 108/80 | HR 62 | Temp 97.8°F | Resp 18 | Ht 69.0 in | Wt 165.8 lb

## 2015-06-19 DIAGNOSIS — Z720 Tobacco use: Secondary | ICD-10-CM

## 2015-06-19 DIAGNOSIS — R911 Solitary pulmonary nodule: Secondary | ICD-10-CM

## 2015-06-19 DIAGNOSIS — I714 Abdominal aortic aneurysm, without rupture, unspecified: Secondary | ICD-10-CM

## 2015-06-19 DIAGNOSIS — D329 Benign neoplasm of meninges, unspecified: Secondary | ICD-10-CM

## 2015-06-19 DIAGNOSIS — E785 Hyperlipidemia, unspecified: Secondary | ICD-10-CM

## 2015-06-19 DIAGNOSIS — F17200 Nicotine dependence, unspecified, uncomplicated: Secondary | ICD-10-CM

## 2015-06-19 LAB — LIPID PANEL
Cholesterol: 145 mg/dL (ref 0–200)
HDL: 46.9 mg/dL (ref >39.00)
LDL Cholesterol: 62 mg/dL (ref 0–99)
NonHDL: 97.81
Total CHOL/HDL Ratio: 3
Triglycerides: 180 mg/dL — ABNORMAL HIGH (ref 0.0–149.0)
VLDL: 36 mg/dL (ref 0.0–40.0)

## 2015-06-19 MED ORDER — PRAVASTATIN SODIUM 80 MG PO TABS: 80.0000 mg | ORAL_TABLET | Freq: Every day | ORAL | Status: DC

## 2015-06-19 MED ORDER — OMEPRAZOLE 40 MG PO CPDR: 40.0000 mg | DELAYED_RELEASE_CAPSULE | Freq: Every day | ORAL | Status: DC

## 2015-06-19 NOTE — Assessment & Plan Note (Signed)
Continues to smoke, discussed smoking cessation.

## 2015-06-19 NOTE — Patient Instructions (Signed)
Please complete lab work prior to leaving. You will be contacted about scheduling your CT chest and your abdominal aortic ultrasound. You will be contacted about your appointment with pulmonology and neurosurgery- it is very important that you keep these appointments. It is very important that you quit smoking.  Follow up in 3 months.

## 2015-06-19 NOTE — Assessment & Plan Note (Signed)
Tolerating statin, obtain FLP.

## 2015-06-19 NOTE — Assessment & Plan Note (Signed)
Will obtain follow up abdominal aortic US.

## 2015-06-19 NOTE — Assessment & Plan Note (Signed)
Reinforced importance of following through with pulmonary referral and that this nodule may reflect a lung cancer.  Obtain follow up CT chest, will reschedule pulmonary consult.

## 2015-06-19 NOTE — Assessment & Plan Note (Signed)
Will arrange follow up with Dr. Christella Noa.

## 2015-06-19 NOTE — Progress Notes (Signed)
Subjective:    Patient ID: Robert Bates, male    DOB: May 20, 1941, 74 y.o.   MRN: [id number]  HPI  Robert Bates is a 74 yr old male who presents today for follow up.  Tobacco abuse-  Has cut to back a lot- reports that he is smoking 1 PPD.  Not motivated to quit.    Abdominal Aortic Aneurysm- due for follow up.  Hyperlipidemia-  Maintained on pravastatin.  zocor was stopped last visit due to neck/back pain.  Still has some neck and back pain. Denies myalgia.   Lab Results  Component Value Date   CHOL 162 09/27/2014   HDL 45.30 09/27/2014   LDLCALC 79 09/27/2014   LDLDIRECT 142.2 12/26/2008   TRIG 187.0* 09/27/2014   CHOLHDL 4 09/27/2014   GERD- maintained on prilosec. Reports gerd symptoms stable.    Lung nodule- Last visit he was advised to arrange appointment with pulmonology.  Note was made back in December of  Progressive enlargement of a RUL lung nodule.  The patient cancelled his appointment last December and then no-showed his appointment in February. He did not reschedule his pulmonary appointment as instructed last visit.    Meningioma- has not followed up with Dr. Christella Noa.    Review of Systems See HPI  Past Medical History  Diagnosis Date  . GERD (gastroesophageal reflux disease)   . Alcoholism     past problem per pt but admits to still drinking - unclear how much  . Tobacco abuse   . Hematuria     refuses work up or referral - understands risks of morbidity / mortality - 11/2008, 12/2008  . Hyperlipemia   . Kidney stones   . 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 [name].     Social History   Social History  . Marital Status: Married    Spouse Name: N/A  . Number of Children: 2  . Years of Education: N/A   Occupational History  . Retired     Administrator  .     Social History Main Topics  . Smoking status: Current Every Day Smoker -- 1.00 packs/day  . Smokeless tobacco: Former Systems developer    Types: Chew    Quit date: 11/04/1958     Comment:  Started smoking at age 66.  Currently smoking 1 ppd.  . Alcohol Use: Yes     Comment: Pt reports rare use  . Drug Use: Not on file  . Sexual Activity: Not on file   Other Topics Concern  . Not on file   Social History Narrative    Past Surgical History  Procedure Laterality Date  . Tonsillectomy    . Hernia repair    . Eye surgery      Cataracts removed    Family History  Problem Relation Age of Onset  . Leukemia Father   . Learning disabilities Son   . Leukemia Other   . Stroke Other   . Emphysema Father     Allergies  Allergen Reactions  . Iodine     REACTION: neck swells    Current Outpatient Prescriptions on File Prior to Visit  Medication Sig Dispense Refill  . aspirin EC 81 MG tablet Take 81 mg by mouth daily.    . meloxicam (MOBIC) 7.5 MG tablet Take 1 tablet (7.5 mg total) by mouth daily. 30 tablet 2  . omeprazole (PRILOSEC) 40 MG capsule TAKE 1 CAPSULE BY MOUTH DAILY. 90 capsule 1  . pravastatin (  PRAVACHOL) 80 MG tablet Take 1 tablet (80 mg total) by mouth daily. 30 tablet 3   No current facility-administered medications on file prior to visit.    Ht '5\' 9"'$  (1.753 m)  Wt 165 lb 12.8 oz (75.206 kg)  BMI 24.47 kg/m2       Objective:   Physical Exam  Constitutional: He is oriented to person, place, and time. He appears well-developed and well-nourished. No distress.  HENT:  Head: Normocephalic and atraumatic.  Cardiovascular: Normal rate and regular rhythm.   No murmur heard. Pulmonary/Chest: Effort normal and breath sounds normal. No respiratory distress. He has no wheezes. He has no rales.  Musculoskeletal:  1+ bilateral LE edema  Neurological: He is alert and oriented to person, place, and time.  Skin: Skin is warm and dry.  Psychiatric: He has a normal mood and affect. His behavior is normal. Thought content normal.          Assessment & Plan:

## 2015-06-19 NOTE — Progress Notes (Signed)
Pre visit review using our clinic review tool, if applicable. No additional management support is needed unless otherwise documented below in the visit note. 

## 2015-06-20 ENCOUNTER — Other Ambulatory Visit: Payer: Self-pay | Admitting: Family

## 2015-06-20 ENCOUNTER — Encounter: Payer: Self-pay | Admitting: Family

## 2015-06-20 MED ORDER — FISH OIL 1000 MG PO CAPS: 2.0000 | ORAL_CAPSULE | Freq: Two times a day (BID) | ORAL | Status: DC

## 2015-06-23 ENCOUNTER — Ambulatory Visit (HOSPITAL_BASED_OUTPATIENT_CLINIC_OR_DEPARTMENT_OTHER)
Admission: RE | Admit: 2015-06-23 | Discharge: 2015-06-23 | Disposition: A | Discharge status: Home / Self Care | Payer: Medicare Other | Source: Ambulatory Visit | Attending: Family | Admitting: Family

## 2015-06-23 ENCOUNTER — Telehealth: Payer: Self-pay | Admitting: Family

## 2015-06-23 DIAGNOSIS — N62 Hypertrophy of breast: Secondary | ICD-10-CM | POA: Insufficient documentation

## 2015-06-23 DIAGNOSIS — R918 Other nonspecific abnormal finding of lung field: Secondary | ICD-10-CM

## 2015-06-23 DIAGNOSIS — I2510 Atherosclerotic heart disease of native coronary artery without angina pectoris: Secondary | ICD-10-CM | POA: Insufficient documentation

## 2015-06-23 DIAGNOSIS — F17200 Nicotine dependence, unspecified, uncomplicated: Secondary | ICD-10-CM | POA: Diagnosis not present

## 2015-06-23 DIAGNOSIS — R911 Solitary pulmonary nodule: Secondary | ICD-10-CM | POA: Insufficient documentation

## 2015-06-23 DIAGNOSIS — I714 Abdominal aortic aneurysm, without rupture, unspecified: Secondary | ICD-10-CM

## 2015-06-23 DIAGNOSIS — E785 Hyperlipidemia, unspecified: Secondary | ICD-10-CM | POA: Insufficient documentation

## 2015-06-23 DIAGNOSIS — I77819 Aortic ectasia, unspecified site: Secondary | ICD-10-CM | POA: Insufficient documentation

## 2015-06-23 NOTE — Telephone Encounter (Addendum)
Reviewed CT Chest. LUL nodule is again seen.  Has enlarged considerably.  Concerning for lung cancer.  I would like patient to see pulmonology for further evaluation as soon as possible.  Abdominal US looks stable.    I tried to contact patient.  He did not answer his phone.  Unable to leave message.  Will try to arrange appointment with pulmonary as soon as possible.    Gilmore Laroche, could you please try patient back again later.

## 2015-06-23 NOTE — Telephone Encounter (Signed)
Unable to leave message on pt's cell#. Left message on home # to return my call.

## 2015-06-26 NOTE — Telephone Encounter (Signed)
Notified pt and he voices understanding. He is agreeable to proceed with referral. Advised pt to let us know if he hasn't been contacted for appt by Thursday.

## 2015-06-29 ENCOUNTER — Ambulatory Visit: Payer: Medicare Other | Admitting: Internal Medicine

## 2015-07-04 ENCOUNTER — Encounter: Payer: Self-pay | Admitting: Internal Medicine

## 2015-07-04 ENCOUNTER — Ambulatory Visit (INDEPENDENT_AMBULATORY_CARE_PROVIDER_SITE_OTHER): Payer: Medicare Other | Admitting: Internal Medicine

## 2015-07-04 VITALS — BP 124/70 | HR 66 | Ht 69.0 in | Wt 168.0 lb

## 2015-07-04 DIAGNOSIS — R911 Solitary pulmonary nodule: Secondary | ICD-10-CM | POA: Diagnosis not present

## 2015-07-04 DIAGNOSIS — Z720 Tobacco use: Secondary | ICD-10-CM

## 2015-07-04 DIAGNOSIS — F17210 Nicotine dependence, cigarettes, uncomplicated: Secondary | ICD-10-CM | POA: Diagnosis not present

## 2015-07-04 NOTE — Patient Instructions (Signed)
Please see patient coordinator before you leave today  to schedule PET scan and I will you the results and tell you what I recommend

## 2015-07-04 NOTE — Progress Notes (Signed)
  Subjective:    Patient ID: Robert Bates, male    DOB: 10/18/1941, 74 y.o.   MRN: [id number]  Brief patient profile:  29 yowm active smoker seen by Dr Joya Gaskins 02/01/13 for CT 05/15/12  C/w A left upper lobe abnormality was seen which was ground glass in nature. The patient had a followup CT scan on 01/21/2013 which showed no real change in the 1.9 x 1.6 cm left upper lobe ground glass nodular infiltrate.  rec Serial f/u > did not do     07/04/2015  consultation/Robert Bates re: spn/ still smoking / referred by DR [name] Chief Complaint  Patient presents with  . Pulmonary Consult    Pt last seen 2014 for SPN. He had recent CT Chest 06/23/15. He states his breathing is doing okay. He has occ cough with brown/yellow sputum.    Not limited by breathing from desired activities including yard work Some am cough/congestion  X first 5-10 min each am never bloody. Not on any resp rx  No obvious day to day or daytime variability or assoc chronic cough or cp or chest tightness, subjective wheeze or overt sinus or hb symptoms. No unusual exp hx or h/o childhood pna/ asthma or knowledge of premature birth.  Sleeping ok without nocturnal  or early am exacerbation  of respiratory  c/o's or need for noct saba. Also denies any obvious fluctuation of symptoms with weather or environmental changes or other aggravating or alleviating factors except as outlined above   Current Medications, Allergies, Complete Past Medical History, Past Surgical History, Family History, and Social History were reviewed in [employer] record.  ROS  The following are not active complaints unless bolded sore throat, dysphagia, dental problems, itching, sneezing,  nasal congestion or excess/ purulent secretions, ear ache,   fever, chills, sweats, unintended wt loss, classically pleuritic or exertional cp, hemoptysis,  orthopnea pnd or leg swelling, presyncope, palpitations, abdominal pain, anorexia, nausea,  vomiting, diarrhea  or change in bowel or bladder habits, change in stools or urine, dysuria,hematuria,  rash, arthralgias, visual complaints, headache, numbness, weakness or ataxia or problems with walking or coordination,  change in mood/affect or memory.          Objective:   Physical Exam  Wt Readings from Last 3 Encounters:  06/19/15 165 lb 12.8 oz (75.206 kg)  02/06/15 170 lb 12.8 oz (77.474 kg)  11/07/14 175 lb 3.2 oz (79.47 kg)    Vital signs reviewed    Gen: Pleasant, well-nourished, in no distress,  normal affect  ENT: No lesions,  mouth clear,  oropharynx clear, no postnasal drip/ no teeth  Neck: No JVD, no TMG, no carotid bruits  Lungs: No use of accessory muscles, no dullness to percussion, clear without rales or rhonchi  Cardiovascular: RRR, heart sounds normal, no murmur or gallops, no peripheral edema  Abdomen: soft and NT, no HSM,  BS normal - pos hoover's at end insp   Musculoskeletal: No deformities, no cyanosis or clubbing  Neuro: alert, non focal  Skin: Warm, no lesions or rashes       CT chest   06/23/15  1. 2.7 x 2.4 cm ground-glass left upper lobe pulmonary nodule without a solid component the size has significantly increased compared with 01/21/2013. The appearance is concerning for an indolent malignancy such as an adenocarcinoma. 2. Coronary artery atherosclerosis.     Assessment & Plan:

## 2015-07-05 NOTE — Assessment & Plan Note (Signed)
See CT chest 06/22/15 LUL nodule increased size from 01/21/13 - spirometry 07/04/2015  FEV1  2.0 (67%) ratio 71  - PET 07/04/2015 >>>   I had an extended discussion with the patient reviewing all relevant studies completed to date and  lasting  45 m  This appears to be a slow-growing tumor and regardless of whether or not it's malignant it should be removed at this point since he is a surgical candidate and is a left upper lobe that would need to be removed leaving him with plenty of ventilatory capacity.  The first step in the workup however is to proceed with a PET scan at this point.  Discussed in detail all the  indications, usual  risks and alternatives  relative to the benefits with patient who agrees to proceed with w/u as outlined  Each maintenance medication was reviewed in detail including most importantly the difference between maintenance and prns and under what circumstances the prns are to be triggered using an action plan format that is not reflected in the computer generated alphabetically organized AVS.    Please see instructions for details which were reviewed in writing and the patient given a copy highlighting the part that I personally wrote and discussed at today's ov.

## 2015-07-05 NOTE — Assessment & Plan Note (Addendum)
>   3 min discussion I reviewed the Fletcher curve with the patient that basically indicates  if you quit smoking when your best day FEV1 is still well preserved (as is clearly  the case here)  it is highly unlikely you will progress to severe disease and informed the patient there was no medication on the market that has proven to alter the curve/ its downward trajectory  or the likelihood of progression of their disease.  Therefore stopping smoking and maintaining abstinence is the most important aspect of care, not choice of inhalers or for that matter, doctors.    He would need to commit to at least 2 weeks of smoking cessation prior to surgery to use his risk of postoperative complications including postoperative atelectasis and respiratory failure as well as hospital-acquired pneumonia

## 2015-07-18 ENCOUNTER — Ambulatory Visit (HOSPITAL_COMMUNITY)
Admission: RE | Admit: 2015-07-18 | Discharge: 2015-07-18 | Disposition: A | Discharge status: Home / Self Care | Payer: Medicare Other | Source: Ambulatory Visit | Attending: Internal Medicine | Admitting: Internal Medicine

## 2015-07-18 DIAGNOSIS — E041 Nontoxic single thyroid nodule: Secondary | ICD-10-CM | POA: Insufficient documentation

## 2015-07-18 DIAGNOSIS — I700 Atherosclerosis of aorta: Secondary | ICD-10-CM | POA: Diagnosis not present

## 2015-07-18 DIAGNOSIS — M899 Disorder of bone, unspecified: Secondary | ICD-10-CM | POA: Insufficient documentation

## 2015-07-18 DIAGNOSIS — R918 Other nonspecific abnormal finding of lung field: Secondary | ICD-10-CM | POA: Insufficient documentation

## 2015-07-18 DIAGNOSIS — R911 Solitary pulmonary nodule: Secondary | ICD-10-CM | POA: Diagnosis present

## 2015-07-18 DIAGNOSIS — K449 Diaphragmatic hernia without obstruction or gangrene: Secondary | ICD-10-CM | POA: Insufficient documentation

## 2015-07-18 LAB — GLUCOSE, CAPILLARY: Glucose-Capillary: 96 mg/dL (ref 65–99)

## 2015-07-18 MED ORDER — FLUDEOXYGLUCOSE F - 18 (FDG) INJECTION
8.4500 | Freq: Once | INTRAVENOUS | Status: DC | PRN
  Administered 2015-07-18: 8.45 via INTRAVENOUS
  Filled 2015-07-18: qty 8.45

## 2015-07-25 NOTE — H&P (Signed)
Patient ID: Robert Bates, male DOB: 1941/02/15, 74 y.o. MRN: [id number]  Brief patient profile:  56 yowm active smoker seen by Dr Joya Gaskins 02/01/13 for CT 05/15/12 C/w A left upper lobe abnormality was seen which was ground glass in nature. The patient had a followup CT scan on 01/21/2013 which showed no real change in the 1.9 x 1.6 cm left upper lobe ground glass nodular infiltrate.  rec Serial f/u > did not do     07/04/2015 consultation/Jaxon Flatt re: spn/ still smoking / referred by DR [name] Chief Complaint  Patient presents with  . Pulmonary Consult    Pt last seen 2014 for SPN. He had recent CT Chest 06/23/15. He states his breathing is doing okay. He has occ cough with brown/yellow sputum.   Not limited by breathing from desired activities including yard work Some am cough/congestion X first 5-10 min each am never bloody. Not on any resp rx  No obvious day to day or daytime variability or assoc chronic cough or cp or chest tightness, subjective wheeze or overt sinus or hb symptoms. No unusual exp hx or h/o childhood pna/ asthma or knowledge of premature birth.  Sleeping ok without nocturnal or early am exacerbation of respiratory c/o's or need for noct saba. Also denies any obvious fluctuation of symptoms with weather or environmental changes or other aggravating or alleviating factors except as outlined above   Current Medications, Allergies, Complete Past Medical History, Past Surgical History, Family History, and Social History were reviewed in [employer] record.  ROS The following are not active complaints unless bolded sore throat, dysphagia, dental problems, itching, sneezing, nasal congestion or excess/ purulent secretions, ear ache, fever, chills, sweats, unintended wt loss, classically pleuritic or exertional cp, hemoptysis, orthopnea pnd or leg swelling, presyncope, palpitations, abdominal pain, anorexia, nausea, vomiting,  diarrhea or change in bowel or bladder habits, change in stools or urine, dysuria,hematuria, rash, arthralgias, visual complaints, headache, numbness, weakness or ataxia or problems with walking or coordination, change in mood/affect or memory.     Objective:  Physical Exam  Wt Readings from Last 3 Encounters:  06/19/15 165 lb 12.8 oz (75.206 kg)  02/06/15 170 lb 12.8 oz (77.474 kg)  11/07/14 175 lb 3.2 oz (79.47 kg)    Vital signs reviewed    Gen: Pleasant, well-nourished, in no distress, normal affect  ENT: No lesions, mouth clear, oropharynx clear, no postnasal drip/ no teeth  Neck: No JVD, no TMG, no carotid bruits  Lungs: No use of accessory muscles, no dullness to percussion, clear without rales or rhonchi  Cardiovascular: RRR, heart sounds normal, no murmur or gallops, no peripheral edema  Abdomen: soft and NT, no HSM, BS normal - pos hoover's at end insp   Musculoskeletal: No deformities, no cyanosis or clubbing  Neuro: alert, non focal  Skin: Warm, no lesions or rashes      CT chest 06/23/15  1. 2.7 x 2.4 cm ground-glass left upper lobe pulmonary nodule without a solid component the size has significantly increased compared with 01/21/2013. The appearance is concerning for an indolent malignancy such as an adenocarcinoma. 2. Coronary artery atherosclerosis.    Assessment & Plan:              Lung nodule - [name], MD at 07/05/2015 6:24 AM     Status: Written Related Problem: Lung nodule   Expand All Collapse All   See CT chest 06/22/15 LUL nodule increased size from 01/21/13 - spirometry 07/04/2015  FEV1 2.0 (67%) ratio 71  - PET 07/04/2015 >>>   I had an extended discussion with the patient reviewing all relevant studies completed to date and lasting 71 m  This appears to be a slow-growing tumor and regardless of whether or not it's malignant it should be removed at this point since he is a surgical  candidate and is a left upper lobe that would need to be removed leaving him with plenty of ventilatory capacity.  The first step in the workup however is to proceed with a PET scan at this point.  Discussed in detail all the indications, usual risks and alternatives relative to the benefits with patient who agrees to proceed with w/u as outlined  Each maintenance medication was reviewed in detail including most importantly the difference between maintenance and prns and under what circumstances the prns are to be triggered using an action plan format that is not reflected in the computer generated alphabetically organized AVS.   Please see instructions for details which were reviewed in writing and the patient given a copy highlighting the part that I personally wrote and discussed at today's ov.             Cigarette smoker - [name], MD at 07/05/2015 6:25 AM     Status: Alison Stalling Related Problem: Cigarette smoker   Expand All Collapse All   > 3 min discussion I reviewed the Fletcher curve with the patient that basically indicates if you quit smoking when your best day FEV1 is still well preserved (as is clearly the case here) it is highly unlikely you will progress to severe disease and informed the patient there was no medication on the market that has proven to alter the curve/ its downward trajectory or the likelihood of progression of their disease. Therefore stopping smoking and maintaining abstinence is the most important aspect of care, not choice of inhalers or for that matter, doctors.   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    07/25/2015 day of fob no prod cough/ no sob no change in exam     Christinia Gully, MD Pulmonary and [place] [phone] After 5:30 PM or weekends, call [phone]

## 2015-07-26 ENCOUNTER — Ambulatory Visit (HOSPITAL_COMMUNITY)
Admission: RE | Admit: 2015-07-26 | Discharge: 2015-07-26 | Disposition: A | Discharge status: Home / Self Care | Payer: Medicare Other | Source: Ambulatory Visit | Attending: Internal Medicine | Admitting: Internal Medicine

## 2015-07-26 ENCOUNTER — Encounter (HOSPITAL_COMMUNITY)
Admission: RE | Disposition: A | Discharge status: Home / Self Care | Payer: Self-pay | Source: Ambulatory Visit | Attending: Internal Medicine

## 2015-07-26 DIAGNOSIS — I2510 Atherosclerotic heart disease of native coronary artery without angina pectoris: Secondary | ICD-10-CM | POA: Diagnosis not present

## 2015-07-26 DIAGNOSIS — R911 Solitary pulmonary nodule: Secondary | ICD-10-CM | POA: Diagnosis not present

## 2015-07-26 DIAGNOSIS — C3490 Malignant neoplasm of unspecified part of unspecified bronchus or lung: Secondary | ICD-10-CM | POA: Diagnosis present

## 2015-07-26 DIAGNOSIS — C3432 Malignant neoplasm of lower lobe, left bronchus or lung: Secondary | ICD-10-CM | POA: Insufficient documentation

## 2015-07-26 DIAGNOSIS — F17210 Nicotine dependence, cigarettes, uncomplicated: Secondary | ICD-10-CM | POA: Insufficient documentation

## 2015-07-26 DIAGNOSIS — R05 Cough: Secondary | ICD-10-CM | POA: Diagnosis present

## 2015-07-26 DIAGNOSIS — Z85118 Personal history of other malignant neoplasm of bronchus and lung: Secondary | ICD-10-CM | POA: Diagnosis present

## 2015-07-26 HISTORY — PX: VIDEO BRONCHOSCOPY: SHX5072

## 2015-07-26 SURGERY — BRONCHOSCOPY, WITH FLUOROSCOPY
Anesthesia: Moderate Sedation | Laterality: Bilateral

## 2015-07-26 MED ORDER — MIDAZOLAM HCL 5 MG/ML IJ SOLN
INTRAMUSCULAR | Status: AC
  Filled 2015-07-26: qty 2

## 2015-07-26 MED ORDER — PHENYLEPHRINE HCL 0.25 % NA SOLN: 1.0000 | Freq: Four times a day (QID) | NASAL | Status: DC | PRN

## 2015-07-26 MED ORDER — MIDAZOLAM HCL 10 MG/2ML IJ SOLN
INTRAMUSCULAR | Status: DC | PRN
  Administered 2015-07-26 (×2): 2.5 mg via INTRAVENOUS

## 2015-07-26 MED ORDER — LIDOCAINE HCL 2 % EX GEL: 1.0000 "application " | Freq: Once | CUTANEOUS | Status: DC

## 2015-07-26 MED ORDER — MEPERIDINE HCL 100 MG/ML IJ SOLN: 100.0000 mg | Freq: Once | INTRAMUSCULAR | Status: DC

## 2015-07-26 MED ORDER — MEPERIDINE HCL 100 MG/ML IJ SOLN
INTRAMUSCULAR | Status: AC
  Filled 2015-07-26: qty 2

## 2015-07-26 MED ORDER — LIDOCAINE HCL 2 % EX GEL
CUTANEOUS | Status: DC | PRN
  Administered 2015-07-26: 1

## 2015-07-26 MED ORDER — LIDOCAINE HCL 1 % IJ SOLN
INTRAMUSCULAR | Status: DC | PRN
  Administered 2015-07-26: 6 mL via RESPIRATORY_TRACT

## 2015-07-26 MED ORDER — EPINEPHRINE HCL 0.1 MG/ML IJ SOSY
PREFILLED_SYRINGE | INTRAMUSCULAR | Status: DC | PRN
  Administered 2015-07-26: 1 via ENDOTRACHEOPULMONARY

## 2015-07-26 MED ORDER — PHENYLEPHRINE HCL 0.25 % NA SOLN
NASAL | Status: DC | PRN
  Administered 2015-07-26: 2 via NASAL

## 2015-07-26 MED ORDER — MIDAZOLAM HCL 5 MG/ML IJ SOLN: 1.0000 mg | Freq: Once | INTRAMUSCULAR | Status: DC

## 2015-07-26 MED ORDER — MEPERIDINE HCL 25 MG/ML IJ SOLN
INTRAMUSCULAR | Status: DC | PRN
  Administered 2015-07-26: 50 mg via INTRAVENOUS

## 2015-07-26 MED ORDER — SODIUM CHLORIDE 0.9 % IV SOLN
INTRAVENOUS | Status: DC
  Administered 2015-07-26: 07:00:00 via INTRAVENOUS

## 2015-07-26 NOTE — Op Note (Signed)
Bronchoscopy Procedure Note  Date of Operation: 07/26/2015   Pre-op Diagnosis: lung ca stage iv  Post-op Diagnosis: same  Surgeon: Christinia Gully  Anesthesia: Monitored Local Anesthesia with Sedation  Operation: Video Flexible fiberoptic bronchoscopy, diagnostic   Findings: friable mass obst LLL at segmental level  Specimen: bal lul. Endobronchial bx LLL  Estimated Blood Loss: min   Complications: none  Indications and History: See updated H and P same date. The risks, benefits, complications, treatment options and expected outcomes were discussed with the patient.  The possibilities of reaction to medication, pulmonary aspiration, perforation of a viscus, bleeding, failure to diagnose a condition and creating a complication requiring transfusion or operation were discussed with the patient who freely signed the consent.    Description of Procedure: The patient was re-examined in the bronchoscopy suite and the site of surgery properly noted/marked.  The patient was identified  and the procedure verified as Flexible Fiberoptic Bronchoscopy.  A Time Out was held and the above information confirmed.   After the induction of topical nasopharyngeal anesthesia, the patient was positioned  and the bronchoscope was passed through the right  naris. The vocal cords were visualized and  1% buffered lidocaine 5 ml was topically placed onto the cords. The cords were nl. The scope was then passed into the trachea.  1% buffered lidocaine given topically. Airways inspected bilaterally to the subsegmental level with the following findings:  friable mass obst LLL at segmental level, o/w nl airways   Interventions:  Bal LUL Tbbx LLL     The Patient was taken to the Endoscopy Recovery area in satisfactory condition.  Attestation: I performed the procedure.  Christinia Gully, MD Pulmonary and [place] [phone] After 5:30 PM or weekends, call [phone]

## 2015-07-26 NOTE — Progress Notes (Signed)
Video bronchoscopy performed Intervention bronchial washings Intervention bronchial biopsies Pt tolerated well  Zyann Mabry David RRT  

## 2015-07-26 NOTE — Discharge Instructions (Signed)
Flexible Bronchoscopy, Care After These instructions give you information on caring for yourself after your procedure. Your doctor may also give you more specific instructions. Call your doctor if you have any problems or questions after your procedure. HOME CARE  Do not eat or drink anything for 2 hours after your procedure. If you try to eat or drink before the medicine wears off, food or drink could go into your lungs. You could also burn yourself.  After 2 hours have passed and when you can cough and gag normally, you may eat soft food and drink liquids slowly.  The day after the test, you may eat your normal diet.  You may do your normal activities.  Keep all doctor visits. GET HELP RIGHT AWAY IF:  You get more and more short of breath.  You get light-headed.  You feel like you are going to pass out (faint).  You have chest pain.  You have new problems that worry you.  You cough up more than a little blood.  You cough up more blood than before. MAKE SURE YOU:  Understand these instructions.  Will watch your condition.  Will get help right away if you are not doing well or get worse. Document Released: 08/18/2009 Document Revised: 10/26/2013 Document Reviewed: 06/25/2013 [facility] Patient Information 2015 [place], [place]. This information is not intended to replace advice given to you by your health care provider. Make sure you discuss any questions you have with your health care provider.   Do not eat or drink until after 1030 today 07/26/15

## 2015-07-27 ENCOUNTER — Telehealth: Payer: Self-pay | Admitting: Internal Medicine

## 2015-07-27 NOTE — Telephone Encounter (Signed)
Called spoke with pt. He reports he received call about his bronch he had done. I don't see anything in EPIC. I advised will forward to Dr. Melvyn Novas to see if he called pt. Please advise thanks

## 2015-07-28 ENCOUNTER — Encounter (HOSPITAL_COMMUNITY): Payer: Self-pay | Admitting: Internal Medicine

## 2015-07-28 ENCOUNTER — Telehealth: Payer: Self-pay | Admitting: *Deleted

## 2015-07-28 ENCOUNTER — Encounter: Payer: Self-pay | Admitting: *Deleted

## 2015-07-28 ENCOUNTER — Other Ambulatory Visit: Payer: Self-pay | Admitting: Internal Medicine

## 2015-07-28 DIAGNOSIS — C3432 Malignant neoplasm of lower lobe, left bronchus or lung: Secondary | ICD-10-CM

## 2015-07-28 NOTE — Progress Notes (Signed)
Quick Note:  Referral made ______

## 2015-07-28 NOTE — Telephone Encounter (Signed)
Oncology Nurse Navigator Documentation  Oncology Nurse Navigator Flowsheets 07/28/2015  Referral date to RadOnc/MedOnc 07/28/2015  Navigator Encounter Type Introductory phone call/I received a referral today form Dr. Gustavus Bryant office.  I was unable to reach nor could I leave a vm message.   Interventions Coordination of Care  Coordination of Care MD Appointments  Time Spent with Patient 15

## 2015-07-28 NOTE — Progress Notes (Signed)
Patient called back.  I gave him and his wife appt for 08/07/15 arrive at 1:45.  They verbalized understanding of appt time and place. I placed labs and notified HIM

## 2015-07-28 NOTE — Telephone Encounter (Signed)
See result

## 2015-08-07 ENCOUNTER — Telehealth: Payer: Self-pay | Admitting: Internal Medicine

## 2015-08-07 ENCOUNTER — Other Ambulatory Visit: Payer: Self-pay | Admitting: Internal Medicine

## 2015-08-07 ENCOUNTER — Ambulatory Visit (HOSPITAL_BASED_OUTPATIENT_CLINIC_OR_DEPARTMENT_OTHER): Payer: Medicare Other | Admitting: Internal Medicine

## 2015-08-07 ENCOUNTER — Encounter: Payer: Self-pay | Admitting: Internal Medicine

## 2015-08-07 ENCOUNTER — Other Ambulatory Visit (HOSPITAL_BASED_OUTPATIENT_CLINIC_OR_DEPARTMENT_OTHER): Payer: Medicare Other

## 2015-08-07 VITALS — BP 133/82 | HR 61 | Temp 98.5°F | Resp 18 | Ht 69.0 in | Wt 171.2 lb

## 2015-08-07 DIAGNOSIS — C3432 Malignant neoplasm of lower lobe, left bronchus or lung: Secondary | ICD-10-CM

## 2015-08-07 DIAGNOSIS — M899 Disorder of bone, unspecified: Secondary | ICD-10-CM | POA: Diagnosis not present

## 2015-08-07 LAB — CBC WITH DIFFERENTIAL/PLATELET
BASO%: 0.6 % (ref 0.0–2.0)
Basophils Absolute: 0.1 10*3/uL (ref 0.0–0.1)
EOS%: 2 % (ref 0.0–7.0)
Eosinophils Absolute: 0.2 10*3/uL (ref 0.0–0.5)
HCT: 41.9 % (ref 38.4–49.9)
HGB: 14 g/dL (ref 13.0–17.1)
LYMPH%: 20.2 % (ref 14.0–49.0)
MCH: 30.9 pg (ref 27.2–33.4)
MCHC: 33.3 g/dL (ref 32.0–36.0)
MCV: 92.8 fL (ref 79.3–98.0)
MONO#: 0.9 10*3/uL (ref 0.1–0.9)
MONO%: 8.2 % (ref 0.0–14.0)
NEUT#: 7.9 10*3/uL — ABNORMAL HIGH (ref 1.5–6.5)
NEUT%: 69 % (ref 39.0–75.0)
Platelets: 253 10*3/uL (ref 140–400)
RBC: 4.52 10*6/uL (ref 4.20–5.82)
RDW: 13.6 % (ref 11.0–14.6)
WBC: 11.5 10*3/uL — ABNORMAL HIGH (ref 4.0–10.3)
lymph#: 2.3 10*3/uL (ref 0.9–3.3)

## 2015-08-07 LAB — COMPREHENSIVE METABOLIC PANEL (CC13)
ALT: 11 U/L (ref 0–55)
AST: 12 U/L (ref 5–34)
Albumin: 3.3 g/dL — ABNORMAL LOW (ref 3.5–5.0)
Alkaline Phosphatase: 83 U/L (ref 40–150)
Anion Gap: 4 mEq/L (ref 3–11)
BUN: 16 mg/dL (ref 7.0–26.0)
CO2: 31 mEq/L — ABNORMAL HIGH (ref 22–29)
Calcium: 9.4 mg/dL (ref 8.4–10.4)
Chloride: 106 mEq/L (ref 98–109)
Creatinine: 0.8 mg/dL (ref 0.7–1.3)
EGFR: 89 mL/min/{1.73_m2} — ABNORMAL LOW (ref >90)
Glucose: 90 mg/dl (ref 70–140)
Potassium: 4.5 mEq/L (ref 3.5–5.1)
Sodium: 141 mEq/L (ref 136–145)
Total Bilirubin: 0.3 mg/dL (ref 0.20–1.20)
Total Protein: 6.3 g/dL — ABNORMAL LOW (ref 6.4–8.3)

## 2015-08-07 NOTE — Patient Instructions (Signed)
Smoking Cessation Quitting smoking is important to your health and has many advantages. However, it is not always easy to quit since nicotine is a very addictive drug. Oftentimes, people try 3 times or more before being able to quit. This document explains the best ways for you to prepare to quit smoking. Quitting takes hard work and a lot of effort, but you can do it. ADVANTAGES OF QUITTING SMOKING  You will live longer, feel better, and live better.  Your body will feel the impact of quitting smoking almost immediately.  Within 20 minutes, blood pressure decreases. Your pulse returns to its normal level.  After 8 hours, carbon monoxide levels in the blood return to normal. Your oxygen level increases.  After 24 hours, the chance of having a heart attack starts to decrease. Your breath, hair, and body stop smelling like smoke.  After 48 hours, damaged nerve endings begin to recover. Your sense of taste and smell improve.  After 72 hours, the body is virtually free of nicotine. Your bronchial tubes relax and breathing becomes easier.  After 2 to 12 weeks, lungs can hold more air. Exercise becomes easier and circulation improves.  The risk of having a heart attack, stroke, cancer, or lung disease is greatly reduced.  After 1 year, the risk of coronary heart disease is cut in half.  After 5 years, the risk of stroke falls to the same as a nonsmoker.  After 10 years, the risk of lung cancer is cut in half and the risk of other cancers decreases significantly.  After 15 years, the risk of coronary heart disease drops, usually to the level of a nonsmoker.  If you are pregnant, quitting smoking will improve your chances of having a healthy baby.  The people you live with, especially any children, will be healthier.  You will have extra money to spend on things other than cigarettes. QUESTIONS TO THINK ABOUT BEFORE ATTEMPTING TO QUIT You may want to talk about your answers with your  health care provider.  Why do you want to quit?  If you tried to quit in the past, what helped and what did not?  What will be the most difficult situations for you after you quit? How will you plan to handle them?  Who can help you through the tough times? Your family? Friends? A health care provider?  What pleasures do you get from smoking? What ways can you still get pleasure if you quit? Here are some questions to ask your health care provider:  How can you help me to be successful at quitting?  What medicine do you think would be best for me and how should I take it?  What should I do if I need more help?  What is smoking withdrawal like? How can I get information on withdrawal? GET READY  Set a quit date.  Change your environment by getting rid of all cigarettes, ashtrays, matches, and lighters in your home, car, or work. Do not let people smoke in your home.  Review your past attempts to quit. Think about what worked and what did not. GET SUPPORT AND ENCOURAGEMENT You have a better chance of being successful if you have help. You can get support in many ways.  Tell your family, friends, and coworkers that you are going to quit and need their support. Ask them not to smoke around you.  Get individual, group, or telephone counseling and support. Programs are available at local hospitals and health centers. Call   your local health department for information about programs in your area.  Spiritual beliefs and practices may help some smokers quit.  Download a "quit meter" on your computer to keep track of quit statistics, such as how long you have gone without smoking, cigarettes not smoked, and money saved.  Get a self-help book about quitting smoking and staying off tobacco. LEARN NEW SKILLS AND BEHAVIORS  Distract yourself from urges to smoke. Talk to someone, go for a walk, or occupy your time with a task.  Change your normal routine. Take a different route to work.  Drink tea instead of coffee. Eat breakfast in a different place.  Reduce your stress. Take a hot bath, exercise, or read a book.  Plan something enjoyable to do every day. Reward yourself for not smoking.  Explore interactive web-based programs that specialize in helping you quit. GET MEDICINE AND USE IT CORRECTLY Medicines can help you stop smoking and decrease the urge to smoke. Combining medicine with the above behavioral methods and support can greatly increase your chances of successfully quitting smoking.  Nicotine replacement therapy helps deliver nicotine to your body without the negative effects and risks of smoking. Nicotine replacement therapy includes nicotine gum, lozenges, inhalers, nasal sprays, and skin patches. Some may be available over-the-counter and others require a prescription.  Antidepressant medicine helps people abstain from smoking, but how this works is unknown. This medicine is available by prescription.  Nicotinic receptor partial agonist medicine simulates the effect of nicotine in your brain. This medicine is available by prescription. Ask your health care provider for advice about which medicines to use and how to use them based on your health history. Your health care provider will tell you what side effects to look out for if you choose to be on a medicine or therapy. Carefully read the information on the package. Do not use any other product containing nicotine while using a nicotine replacement product.  RELAPSE OR DIFFICULT SITUATIONS Most relapses occur within the first 3 months after quitting. Do not be discouraged if you start smoking again. Remember, most people try several times before finally quitting. You may have symptoms of withdrawal because your body is used to nicotine. You may crave cigarettes, be irritable, feel very hungry, cough often, get headaches, or have difficulty concentrating. The withdrawal symptoms are only temporary. They are strongest  when you first quit, but they will go away within 10-14 days. To reduce the chances of relapse, try to:  Avoid drinking alcohol. Drinking lowers your chances of successfully quitting.  Reduce the amount of caffeine you consume. Once you quit smoking, the amount of caffeine in your body increases and can give you symptoms, such as a rapid heartbeat, sweating, and anxiety.  Avoid smokers because they can make you want to smoke.  Do not let weight gain distract you. Many smokers will gain weight when they quit, usually less than 10 pounds. Eat a healthy diet and stay active. You can always lose the weight gained after you quit.  Find ways to improve your mood other than smoking. FOR MORE INFORMATION  www.smokefree.gov  Document Released: 10/15/2001 Document Revised: 03/07/2014 Document Reviewed: 01/30/2012 [employer] Patient Information 2015 [employer]. This information is not intended to replace advice given to you by your health care provider. Make sure you discuss any questions you have with your health care provider.  

## 2015-08-07 NOTE — Telephone Encounter (Signed)
Per 10/03 POF, pt will be contacted by [facility] Sched[facility] for MRI/CT Biopsy, gave pt AVS and advised Hinton Dyer will call to schedule MTOC with [facility].... Cherylann Banas

## 2015-08-07 NOTE — Progress Notes (Signed)
[place] [phone]   [phone]  CONSULT NOTE  REFERRING PHYSICIAN: Dr. Christinia Gully  REASON FOR CONSULTATION:  74 years old white male recently diagnosed with lung cancer  HPI Robert Bates is a 74 y.o. male with past medical history significant for dyslipidemia, abdominal aortic aneurysm, GERD, osteoarthritis, history of meningioma as well as long history of smoking. The patient has a known groundglass opacity in the left upper lobe for the last 2 years. CT scan of the chest without contrast on 06/23/2015 showed 2.7 x 2.4 cm groundglass left upper lobe pulmonary nodule without the solid component and the size had significantly increased compared to 01/21/2013 concerning for an indolent malignancy such as adenocarcinoma. The patient was referred to Dr. Melvyn Novas and on 07/18/2015 he has a PET scan performed and it showed an Endobag rhonchi lesion within the left lower lobe airway concerning for primary bronchogenic carcinoma. There was also hypermetabolic lesion involving the spinous process of the L3 vertebra suspicious for bone metastasis. There was also nonspecific FDG uptake identified within the normal size of right paratracheal and subcarinal lymph nodes. The patient has persistent suspicious. Groundglass attenuation nodule within the left upper lobe with low level FDG uptake which remain suspicious for pulmonary adenocarcinoma. On 07/26/2015 the patient underwent flexible fiberoptic bronchoscopy under the care of Dr. Melvyn Novas and the final pathology (Accession: [id number]) is positive for squamous cell carcinoma.  Dr. Melvyn Novas kindly referred the patient to me today for evaluation and recommendation regarding treatment of his condition. When seen today the patient is feeling fine except for fatigue. He denied having any significant chest pain, shortness breath, cough or hemoptysis. The patient denied having any significant weight loss or night sweats. He has no  headache or visual changes. He denied having any significant nausea or vomiting, no fever or chills. Family history significant for a father with leukemia and mother died from old age. The patient is married and has 2 children. He was accompanied today by his wife Tye [place] and his niece Hassan Rowan. He used to work as a Administrator. He has a history of smoking 3 packs per day for around 54 years and unfortunately he continues to smoke 1.5 pack per day. He has no current history of alcohol or drug abuse.  HPI  Past Medical History  Diagnosis Date  . GERD (gastroesophageal reflux disease)   . Alcoholism ([place])     past problem per pt but admits to still drinking - unclear how much  . Tobacco abuse   . Hematuria     refuses work up or referral - understands risks of morbidity / mortality - 11/2008, 12/2008  . Hyperlipemia   . Kidney stones   . Meningioma ([place]) 10/25/2013    Follows with Dr. [name].     Past Surgical History  Procedure Laterality Date  . Tonsillectomy    . Hernia repair    . Eye surgery      Cataracts removed  . Video bronchoscopy Bilateral 07/26/2015    Procedure: VIDEO BRONCHOSCOPY WITH FLUORO;  Surgeon: [name], MD;  Location: [facility];  Service: Cardiopulmonary;  Laterality: Bilateral;    Family History  Problem Relation Age of Onset  . Leukemia Father   . Learning disabilities Son   . Leukemia Other   . Stroke Other   . Emphysema Father     Social History Social History  Substance Use Topics  . Smoking status: Current Every Day Smoker -- 1.00  packs/day  . Smokeless tobacco: Former Systems developer    Types: Chew    Quit date: 11/04/1958     Comment: Started smoking at age 25.  Currently smoking 1 ppd.  . Alcohol Use: Yes     Comment: Pt reports rare use    Allergies  Allergen Reactions  . Iodine     REACTION: neck swells    Current Outpatient Prescriptions  Medication Sig Dispense Refill  . aspirin EC 81 MG tablet Take 81 mg by mouth daily.      . Aspirin-Acetaminophen-Caffeine (GOODY HEADACHE PO) Take 1 Dose by mouth daily as needed.    . meloxicam (MOBIC) 7.5 MG tablet Take 1 tablet (7.5 mg total) by mouth daily. (Patient taking differently: Take 7.5 mg by mouth daily as needed. ) 30 tablet 2  . omeprazole (PRILOSEC) 40 MG capsule Take 1 capsule (40 mg total) by mouth daily. 90 capsule 1  . pravastatin (PRAVACHOL) 80 MG tablet Take 1 tablet (80 mg total) by mouth daily. 90 tablet 1   No current facility-administered medications for this visit.    Review of Systems  Constitutional: positive for fatigue Eyes: negative Ears, nose, mouth, throat, and face: negative Respiratory: negative Cardiovascular: negative Gastrointestinal: negative Genitourinary:negative Integument/breast: negative Hematologic/lymphatic: negative Musculoskeletal:negative Neurological: negative Behavioral/Psych: negative Endocrine: negative Allergic/Immunologic: negative  Physical Exam  QGB:EEFEO, healthy, no distress, well nourished and well developed SKIN: skin color, texture, turgor are normal, no rashes or significant lesions HEAD: Normocephalic, No masses, lesions, tenderness or abnormalities EYES: normal, PERRLA, Conjunctiva are pink and non-injected EARS: External ears normal, Canals clear OROPHARYNX:no exudate, no erythema and lips, buccal mucosa, and tongue normal  NECK: supple, no adenopathy, no JVD LYMPH:  no palpable lymphadenopathy, no hepatosplenomegaly LUNGS: clear to auscultation , and palpation HEART: regular rate & rhythm, no murmurs and no gallops ABDOMEN:abdomen soft, non-tender, normal bowel sounds and no masses or organomegaly BACK: Back symmetric, no curvature., No CVA tenderness EXTREMITIES:no joint deformities, effusion, or inflammation, no edema, no skin discoloration  NEURO: alert & oriented x 3 with fluent speech, no focal motor/sensory deficits  PERFORMANCE STATUS: ECOG 1  LABORATORY DATA: Lab Results  Component  Value Date   WBC 11.5* 08/07/2015   HGB 14.0 08/07/2015   HCT 41.9 08/07/2015   MCV 92.8 08/07/2015   PLT 253 08/07/2015      Chemistry      Component Value Date/Time   NA 141 08/07/2015 1315   NA 138 09/27/2014 1159   K 4.5 08/07/2015 1315   K 4.0 09/27/2014 1159   CL 101 09/27/2014 1159   CO2 31* 08/07/2015 1315   CO2 27 09/27/2014 1159   BUN 16.0 08/07/2015 1315   BUN 19 09/27/2014 1159   CREATININE 0.8 08/07/2015 1315   CREATININE 0.9 09/27/2014 1159   CREATININE 0.84 07/30/2013 1158      Component Value Date/Time   CALCIUM 9.4 08/07/2015 1315   CALCIUM 8.9 09/27/2014 1159   ALKPHOS 83 08/07/2015 1315   ALKPHOS 66 09/27/2014 1159   AST 12 08/07/2015 1315   AST 15 09/27/2014 1159   ALT 11 08/07/2015 1315   ALT 13 09/27/2014 1159   BILITOT 0.30 08/07/2015 1315   BILITOT 0.5 09/27/2014 1159       RADIOGRAPHIC STUDIES: Nm Pet Image Initial (pi) Skull Base To Thigh  07/18/2015   CLINICAL DATA:  Initial treatment strategy for Lung cancer.  EXAM: NUCLEAR MEDICINE PET SKULL BASE TO THIGH  TECHNIQUE: 8.45 mCi F-18 FDG was injected  intravenously. Full-ring PET imaging was performed from the skull base to thigh after the radiotracer. CT data was obtained and used for attenuation correction and anatomic localization.  FASTING BLOOD GLUCOSE:  Value: 9.6 mg/dl  COMPARISON:  06/23/2015  FINDINGS: NECK  Within the upper pole of the right lobe of thyroid gland there is a hypermetabolic nodule measuring 1.4 cm and has an SUV max equal to 5.74. No hypermetabolic cervical lymph nodes identified.  CHEST  Right paratracheal lymph node is normal measuring 8 mm but has an SUV max equal to 4.19. Mild FDG uptake associated with sub carinal lymph node has an SUV max equal to 3.86. Low attenuation nodule within the left adrenal gland measures 1.5 cm and is unchanged from 7/ There is a endobronchial lesion within the left lower lobe bronchi measuring approximately 2 cm. This has an SUV max equal to  23.8. And is worrisome for primary bronchogenic carcinoma. Sub solid scratch sec the pure ground-glass attenuating nodule within the left upper lobe measures 2.8 cm and has an SUV max equal to 1.25.  ABDOMEN/PELVIS  No abnormal hypermetabolic activity within the liver, pancreas, adrenal glands, or spleen. There is a large hiatal hernia identified. Aortic atherosclerosis noted. The infrarenal abdominal aorta has a AP diameter of 2.8 cm. No hypermetabolic lymph nodes in the abdomen or pelvis.  SKELETON  Hypermetabolic lesion involving the spinous process of the L3 vertebra is identified within SUV max equal to 5.7. Suspicious for bone metastasis.  IMPRESSION: 1. There is an endobronchial lesion within the left lower lobe airway which is concerning for primary bronchogenic carcinoma. There is a hypermetabolic lesion involving the spinous process of the L3 vertebra, suspicious for bone metastasis. 2. Nonspecific FDG uptake is identified within normal-sized right paratracheal and sub- carinal lymph nodes. 3. Persistent, suspicious pure ground-glass attenuating nodule within the left upper lobe exhibits low level FDG uptake. Note, PET-CT is of limited value in evaluating pure ground-glass nodules greater than 5 mm and is potentially misleading and therefore not recommended. However, given the interval increase in size this remains worrisome for pulmonary adenocarcinoma. 4. Hypermetabolic nodule in the right lobe of thyroid gland. Hypermetabolic thyroid nodules on PET have up to 40-50% incidence of malignancy; recommend further evaluation with thyroid ultrasound and possible US-guided fine needle aspiration.   Electronically Signed   By: Kerby Moors M.D.   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 There was also a suspicious bone lesion at the L3 spinous process worrisome for metastatic disease.   PLAN: I had a lengthy discussion with the patient and his family today about his current condition and further investigation to rule out metastatic disease. I will complete the staging workup by ordering a MRI of the brain to rule out brain metastasis. I also recommended for the patient to have CT-guided core biopsy of the L3 spinous process to rule out metastatic disease in that area. I will arrange for the patient to come back for follow-up visit in 2 weeks to the multidisciplinary thoracic oncology clinic for reevaluation and discussion of his treatment options based on the biopsy and MRI results. I will see patient and his family the time to ask questions and I answered them completely to their satisfaction. The patient  was advised to call immediately if he has any concerning symptoms in the interval. The patient voices understanding of current disease status and treatment options and is in agreement with the current care plan.  All questions were answered. The patient knows to call the clinic with any problems, questions or concerns. We can certainly see the patient much sooner if necessary.  Thank you so much for allowing me to participate in the care of [facility]. I will continue to follow up the patient with you and assist in his care.  I spent 40 minutes counseling the patient face to face. The total time spent in the appointment was 60 minutes.  Disclaimer: This note was dictated with voice recognition software. Similar sounding words can inadvertently be transcribed and may not be corrected upon review.   Morry Veiga K. August 07, 2015, 3:11 PM

## 2015-08-09 ENCOUNTER — Ambulatory Visit (INDEPENDENT_AMBULATORY_CARE_PROVIDER_SITE_OTHER): Payer: Medicare Other | Admitting: Behavioral Health

## 2015-08-09 DIAGNOSIS — Z23 Encounter for immunization: Secondary | ICD-10-CM

## 2015-08-09 NOTE — Progress Notes (Signed)
Pre visit review using our clinic review tool, if applicable. No additional management support is needed unless otherwise documented below in the visit note. 

## 2015-08-11 ENCOUNTER — Telehealth: Payer: Self-pay | Admitting: *Deleted

## 2015-08-11 ENCOUNTER — Encounter: Payer: Self-pay | Admitting: Internal Medicine

## 2015-08-11 ENCOUNTER — Other Ambulatory Visit: Payer: Self-pay | Admitting: Radiology

## 2015-08-11 ENCOUNTER — Encounter: Payer: Self-pay | Admitting: *Deleted

## 2015-08-11 DIAGNOSIS — C3432 Malignant neoplasm of lower lobe, left bronchus or lung: Secondary | ICD-10-CM

## 2015-08-11 NOTE — Telephone Encounter (Signed)
Oncology Nurse Navigator Documentation  Oncology Nurse Navigator Flowsheets 08/11/2015  Navigator Encounter Type Telephone/called patient today to set up for thoracic clinic on 08/17/15 arrive at 1:15.  Patient verbalized understanding of appt time and place.  Patient Visit Type Follow-up  Treatment Phase Abnormal Scans  Interventions Coordination of Care  Coordination of Care MD Appointments  Time Spent with Patient 15

## 2015-08-11 NOTE — Progress Notes (Signed)
Contacted pt to introduce myself as Estate manager/land agent and to see if he had any financial questions or concerns. Pt states he does not and pt his spouse Tye [place] on the phone. I explained to her what my role was as a financial advocate and asked if she had any questions or concerns. I also advised that I would follow up in a few weeks to see if a treatment plan has been established and if at that time would be interested in financial assistance if available. Tye [place] has my name and number for any further questions/concerns.

## 2015-08-14 ENCOUNTER — Other Ambulatory Visit: Payer: Self-pay | Admitting: Internal Medicine

## 2015-08-14 ENCOUNTER — Ambulatory Visit (HOSPITAL_COMMUNITY)
Admission: RE | Admit: 2015-08-14 | Discharge: 2015-08-14 | Disposition: A | Discharge status: Home / Self Care | Payer: Medicare Other | Source: Ambulatory Visit | Attending: Internal Medicine | Admitting: Internal Medicine

## 2015-08-14 ENCOUNTER — Encounter (HOSPITAL_COMMUNITY): Payer: Self-pay

## 2015-08-14 DIAGNOSIS — M899 Disorder of bone, unspecified: Secondary | ICD-10-CM | POA: Diagnosis present

## 2015-08-14 DIAGNOSIS — C3490 Malignant neoplasm of unspecified part of unspecified bronchus or lung: Secondary | ICD-10-CM | POA: Insufficient documentation

## 2015-08-14 DIAGNOSIS — C3432 Malignant neoplasm of lower lobe, left bronchus or lung: Secondary | ICD-10-CM

## 2015-08-14 DIAGNOSIS — Z79899 Other long term (current) drug therapy: Secondary | ICD-10-CM | POA: Insufficient documentation

## 2015-08-14 DIAGNOSIS — Z7982 Long term (current) use of aspirin: Secondary | ICD-10-CM | POA: Insufficient documentation

## 2015-08-14 LAB — PROTIME-INR
INR: 1.1 (ref 0.00–1.49)
Prothrombin Time: 14.4 seconds (ref 11.6–15.2)

## 2015-08-14 LAB — APTT: aPTT: 33 seconds (ref 24–37)

## 2015-08-14 MED ORDER — SODIUM CHLORIDE 0.9 % IV SOLN
INTRAVENOUS | Status: DC
  Administered 2015-08-14: 500 mL via INTRAVENOUS

## 2015-08-14 MED ORDER — HYDROCODONE-ACETAMINOPHEN 5-325 MG PO TABS: 1.0000 | ORAL_TABLET | ORAL | Status: DC | PRN

## 2015-08-14 MED ORDER — FENTANYL CITRATE (PF) 100 MCG/2ML IJ SOLN
INTRAMUSCULAR | Status: AC
  Filled 2015-08-14: qty 4

## 2015-08-14 MED ORDER — MIDAZOLAM HCL 2 MG/2ML IJ SOLN
INTRAMUSCULAR | Status: AC
  Filled 2015-08-14: qty 6

## 2015-08-14 MED ORDER — MIDAZOLAM HCL 2 MG/2ML IJ SOLN
INTRAMUSCULAR | Status: AC | PRN
  Administered 2015-08-14: 0.5 mg via INTRAVENOUS
  Administered 2015-08-14: 1 mg via INTRAVENOUS

## 2015-08-14 MED ORDER — FENTANYL CITRATE (PF) 100 MCG/2ML IJ SOLN
INTRAMUSCULAR | Status: AC | PRN
  Administered 2015-08-14: 50 ug via INTRAVENOUS

## 2015-08-14 NOTE — Procedures (Signed)
CT core bx lumbar L3 spinous process lesion 11g x3 to surg path No complication No blood loss. See complete dictation in [facility].

## 2015-08-14 NOTE — Discharge Instructions (Signed)
Needle Biopsy of the Bone, Care After °Refer to this sheet in the next few weeks. These instructions provide you with information about caring for yourself after your procedure. Your health care provider may also give you more specific instructions. Your treatment has been planned according to current medical practices, but problems sometimes occur. Call your health care provider if you have any problems or questions after your procedure. °WHAT TO EXPECT AFTER THE PROCEDURE  °After your procedure, it is common to have soreness or tenderness at the puncture site. °HOME CARE INSTRUCTIONS °· Take over-the-counter and prescription medicines only as told by your health care provider. °· Bathe and shower as told by your health care provider. °· Follow instructions from your health care provider about: °· How to take care of your puncture site. °· When and how you should change your bandage (dressing). °· When you should remove your dressing. °· Check your puncture site every day for signs of infection. Watch for: °· Redness, swelling, or worsening pain. °· Fluid, blood, or pus. °· Return to your normal activities as told by your health care provider. °· Keep all follow-up visits as told by your health care provider. This is important. °SEEK MEDICAL CARE IF: °· You have redness, swelling, or worsening pain at the site of your puncture. °· You have fluid, blood, or pus coming from your puncture site. °· You have a fever. °· You have persistent nausea or vomiting. °SEEK IMMEDIATE MEDICAL CARE IF: °· You develop a rash. °· You have difficulty breathing. °  °This information is not intended to replace advice given to you by your health care provider. Make sure you discuss any questions you have with your health care provider. °  °Document Released: 05/10/2005 Document Revised: 07/12/2015 Document Reviewed: 11/28/2014 °Elsevier Interactive Patient Education ©2016 [employer] °Needle Biopsy of the Bone °A bone biopsy is a  procedure in which a small sample of bone is removed. The sample is taken with a needle. Then, the bone sample is looked at under a microscope to check for abnormalities. The sample is usually taken from a bone that is close to the skin. This procedure may be done to check for various problems with the bone. You may need this procedure if imaging tests or blood tests have indicated a possible problem. This procedure may be done to help determine if a bone tumor is cancerous (malignant). A bone biopsy can help to diagnose problems such as: °· Tumors of the bone (sarcomas) and bone marrow (multiple myeloma). °· Bone that forms abnormally (Paget disease). °· Noncancerous (benign) bone cysts. °· Bony growths. °· Infections in the bone. °LET YOUR HEALTH CARE PROVIDER KNOW ABOUT: °· Any allergies you have. °· All medicines you are taking, including vitamins, herbs, eye drops, creams, and over-the-counter medicines. °· Previous problems you or members of your family have had with the use of anesthetics. °· Any blood disorders you have. °· Previous surgeries you have had. °· Any medical conditions you have. °RISKS AND COMPLICATIONS °Generally, this is a safe procedure. However, problems may occur, including: °· Excessive bleeding. °· Infection. °· Injury to surrounding tissue. °BEFORE THE PROCEDURE °· Ask your health care provider about: °· Changing or stopping your regular medicines. This is especially important if you are taking diabetes medicines or blood thinners. °· Taking medicines such as aspirin and ibuprofen. These medicines can thin your blood. Do not take these medicines before your procedure if your health care provider instructs you not to. °·   Follow instructions from your health care provider about eating or drinking restrictions. °· Plan to have someone take you home after the procedure. °· If you go home right after the procedure, plan to have someone with you for 24 hours. °PROCEDURE °· An IV tube may be  inserted into one of your veins. °· The injection site will be cleaned with a germ-killing solution (antiseptic). °· You will be given one or more of the following: °· A medicine to help you relax (sedative). °· A medicine to numb the area (local anesthetic). °· The sample of bone will be removed by putting a large needle through the skin and into the bone. °· The needle will be removed. °· A bandage (dressing) will be placed over the insertion site and taped in place. °The procedure may vary among health care providers and hospitals. °AFTER THE PROCEDURE °· Your blood pressure, heart rate, breathing rate, and blood oxygen level will be monitored often until the medicines you were given have worn off. °· Return to your normal activities as told by your health care provider. °  °This information is not intended to replace advice given to you by your health care provider. Make sure you discuss any questions you have with your health care provider. °  °Document Released: 08/29/2004 Document Revised: 07/12/2015 Document Reviewed: 11/28/2014 °Elsevier Interactive Patient Education ©2016 [employer] Inc[employer] °Moderate Conscious Sedation, Adult °Sedation is the use of medicines to promote relaxation and relieve discomfort and anxiety. Moderate conscious sedation is a type of sedation. Under moderate conscious sedation you are less alert than normal but are still able to respond to instructions or stimulation. Moderate conscious sedation is used during short medical and dental procedures. It is milder than deep sedation or general anesthesia and allows you to return to your regular activities sooner. °LET YOUR HEALTH CARE PROVIDER KNOW ABOUT:  °· Any allergies you have. °· All medicines you are taking, including vitamins, herbs, eye drops, creams, and over-the-counter medicines. °· Use of steroids (by mouth or creams). °· Previous problems you or members of your family have had with the use of anesthetics. °· Any blood disorders  you have. °· Previous surgeries you have had. °· Medical conditions you have. °· Possibility of pregnancy, if this applies. °· Use of cigarettes, alcohol, or illegal drugs. °RISKS AND COMPLICATIONS °Generally, this is a safe procedure. However, as with any procedure, problems can occur. Possible problems include: °· Oversedation. °· Trouble breathing on your own. You may need to have a breathing tube until you are awake and breathing on your own. °· Allergic reaction to any of the medicines used for the procedure. °BEFORE THE PROCEDURE °· You may have blood tests done. These tests can help show how well your kidneys and liver are working. They can also show how well your blood clots. °· A physical exam will be done.   °· Only take medicines as directed by your health care provider. You may need to stop taking medicines (such as blood thinners, aspirin, or nonsteroidal anti-inflammatory drugs) before the procedure.   °· Do not eat or drink at least 6 hours before the procedure or as directed by your health care provider. °· Arrange for a responsible adult, family member, or friend to take you home after the procedure. He or she should stay with you for at least 24 hours after the procedure, until the medicine has worn off. °PROCEDURE  °· An intravenous (IV) catheter will be inserted into one of your veins. Medicine will   be able to flow directly into your body through this catheter. You may be given medicine through this tube to help prevent pain and help you relax. °· The medical or dental procedure will be done. °AFTER THE PROCEDURE °· You will stay in a recovery area until the medicine has worn off. Your blood pressure and pulse will be checked.   °·  Depending on the procedure you had, you may be allowed to go home when you can tolerate liquids and your pain is under control. °  °This information is not intended to replace advice given to you by your health care provider. Make sure you discuss any questions you  have with your health care provider. °  °Document Released: 07/16/2001 Document Revised: 11/11/2014 Document Reviewed: 06/28/2013 °Elsevier Interactive Patient Education ©2016 [employer] °Moderate Conscious Sedation, Adult, Care After °Refer to this sheet in the next few weeks. These instructions provide you with information on caring for yourself after your procedure. Your health care provider may also give you more specific instructions. Your treatment has been planned according to current medical practices, but problems sometimes occur. Call your health care provider if you have any problems or questions after your procedure. °WHAT TO EXPECT AFTER THE PROCEDURE  °After your procedure: °· You may feel sleepy, clumsy, and have poor balance for several hours. °· Vomiting may occur if you eat too soon after the procedure. °HOME CARE INSTRUCTIONS °· Do not participate in any activities where you could become injured for at least 24 hours. Do not: °¨ Drive. °¨ Swim. °¨ Ride a bicycle. °¨ Operate heavy machinery. °¨ Cook. °¨ Use power tools. °¨ Climb ladders. °¨ Work from a high place. °· Do not make important decisions or sign legal documents until you are improved. °· If you vomit, drink water, juice, or soup when you can drink without vomiting. Make sure you have little or no nausea before eating solid foods. °· Only take over-the-counter or prescription medicines for pain, discomfort, or fever as directed by your health care provider. °· Make sure you and your family fully understand everything about the medicines given to you, including what side effects may occur. °· You should not drink alcohol, take sleeping pills, or take medicines that cause drowsiness for at least 24 hours. °· If you smoke, do not smoke without supervision. °· If you are feeling better, you may resume normal activities 24 hours after you were sedated. °· Keep all appointments with your health care provider. °SEEK MEDICAL CARE IF: °· Your  skin is pale or bluish in color. °· You continue to feel nauseous or vomit. °· Your pain is getting worse and is not helped by medicine. °· You have bleeding or swelling. °· You are still sleepy or feeling clumsy after 24 hours. °SEEK IMMEDIATE MEDICAL CARE IF: °· You develop a rash. °· You have difficulty breathing. °· You develop any type of allergic problem. °· You have a fever. °MAKE SURE YOU: °· Understand these instructions. °· Will watch your condition. °· Will get help right away if you are not doing well or get worse. °  °This information is not intended to replace advice given to you by your health care provider. Make sure you discuss any questions you have with your health care provider. °  °Document Released: 08/11/2013 Document Revised: 11/11/2014 Document Reviewed: 08/11/2013 °Elsevier Interactive Patient Education ©2016 Elsevier Inc. ° ° °

## 2015-08-14 NOTE — H&P (Signed)
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 PET revealed hypermetabolic L3 bone lesion, patient has been seen by Dr. Julien Nordmann on 08/07/15 and IR received request for image guided L3 bone lesion biopsy.  The patient has had a H&P performed within the last 30 days, all history, medications, and exam have been reviewed. The patient denies any interval changes since the H&P.  Medications: Prior to Admission medications   Medication Sig Start Date End Date Taking? Authorizing Provider  aspirin EC 81 MG tablet Take 81 mg by mouth daily. 05/06/11  Yes [name], NP  Aspirin-Acetaminophen-Caffeine (GOODY HEADACHE PO) Take 1 Dose by mouth daily as needed.   Yes Historical Provider, MD  ibuprofen (ADVIL,MOTRIN) 200 MG tablet Take 400 mg by mouth every 6 (six) hours as needed for mild pain.   Yes Historical Provider, MD  meloxicam (MOBIC) 7.5 MG tablet Take 1 tablet (7.5 mg total) by mouth daily. Patient taking differently: Take 7.5 mg by mouth daily as needed.  02/06/15  Yes [name], NP  omeprazole (PRILOSEC) 40 MG capsule Take 1 capsule (40 mg total) by mouth daily. 06/19/15  Yes [name], NP  pravastatin (PRAVACHOL) 80 MG tablet Take 1 tablet (80 mg total) by mouth daily. 06/19/15  Yes [name], NP  acetaminophen (TYLENOL) 500 MG tablet Take 1,000 mg by mouth every 6 (six) hours as needed for mild pain.    Historical Provider, MD     Vital Signs: BP 126/74 mmHg  Pulse 61  Temp(Src) 97.4 F (36.3 C) (Oral)  Resp 18  SpO2 98%  Physical Exam  Constitutional: He is oriented to person, place, and time. No distress.  HENT:  Head: Normocephalic and atraumatic.  Neck: No tracheal deviation present.  Cardiovascular: Normal rate and regular rhythm.  Exam reveals no gallop and no friction rub.   No murmur heard. Pulmonary/Chest: Effort normal and breath sounds normal. No respiratory distress. He has no wheezes. He has no  rales.  Abdominal: Soft. Bowel sounds are normal. He exhibits no distension. There is no tenderness.  Neurological: He is alert and oriented to person, place, and time.  Skin: He is not diaphoretic.    Mallampati Score:  MD Evaluation Airway: WNL Heart: WNL Abdomen: WNL Chest/ Lungs: WNL ASA  Classification: 3 Mallampati/Airway Score: Two  Labs:  CBC:  Recent Labs  08/07/15 1315  WBC 11.5*  HGB 14.0  HCT 41.9  PLT 253    COAGS: No results for input(s): INR, APTT in the last 8760 hours.  BMP:  Recent Labs  09/27/14 1159 08/07/15 1315  NA 138 141  K 4.0 4.5  CL 101  --   CO2 27 31*  GLUCOSE 86 90  BUN 19 16.0  CALCIUM 8.9 9.4  CREATININE 0.9 0.8    LIVER FUNCTION TESTS:  Recent Labs  09/27/14 1159 08/07/15 1315  BILITOT 0.5 0.30  AST 15 12  ALT 13 11  ALKPHOS 66 83  PROT 6.6 6.3*  ALBUMIN 3.7 3.3*    Assessment/Plan:  Non small cell lung cancer, squamous cell carcinoma- recently diagnosed  L3 hypermetabolic bone lesion Seen by Dr. Julien Nordmann 08/07/15 Request for image guided L3 bone lesion biopsy with sedation The patient has been NPO, no blood thinners taken, labs and vitals have been reviewed. Risks and Benefits discussed with the patient including, but not limited to bleeding, infection, damage to adjacent structures or low yield requiring additional tests. All of the patient's questions were  answered, patient is agreeable to proceed. Consent signed and in chart.    SignedHedy Jacob 08/14/2015, 12:14 PM

## 2015-08-15 ENCOUNTER — Ambulatory Visit (HOSPITAL_COMMUNITY)
Admission: RE | Admit: 2015-08-15 | Discharge: 2015-08-15 | Disposition: A | Discharge status: Home / Self Care | Payer: Medicare Other | Source: Ambulatory Visit | Attending: Internal Medicine | Admitting: Internal Medicine

## 2015-08-15 ENCOUNTER — Telehealth: Payer: Self-pay | Admitting: Internal Medicine

## 2015-08-15 DIAGNOSIS — D320 Benign neoplasm of cerebral meninges: Secondary | ICD-10-CM | POA: Diagnosis not present

## 2015-08-15 DIAGNOSIS — J019 Acute sinusitis, unspecified: Secondary | ICD-10-CM | POA: Insufficient documentation

## 2015-08-15 DIAGNOSIS — J329 Chronic sinusitis, unspecified: Secondary | ICD-10-CM | POA: Insufficient documentation

## 2015-08-15 DIAGNOSIS — C3432 Malignant neoplasm of lower lobe, left bronchus or lung: Secondary | ICD-10-CM | POA: Insufficient documentation

## 2015-08-15 MED ORDER — GADOBENATE DIMEGLUMINE 529 MG/ML IV SOLN
15.0000 mL | Freq: Once | INTRAVENOUS | Status: AC | PRN
  Administered 2015-08-15: 15 mL via INTRAVENOUS

## 2015-08-15 NOTE — Telephone Encounter (Signed)
returned call adn s.w. pt and confirmed all appts...Marland Kitchenok and aware

## 2015-08-16 ENCOUNTER — Telehealth: Payer: Self-pay | Admitting: *Deleted

## 2015-08-16 NOTE — Telephone Encounter (Signed)
Called pt and confirmed 08/17/15 clinic appt w/ him.

## 2015-08-17 ENCOUNTER — Other Ambulatory Visit (HOSPITAL_BASED_OUTPATIENT_CLINIC_OR_DEPARTMENT_OTHER): Payer: Medicare Other

## 2015-08-17 ENCOUNTER — Ambulatory Visit: Payer: Medicare Other | Attending: Internal Medicine | Admitting: Physical Therapy

## 2015-08-17 ENCOUNTER — Encounter: Payer: Self-pay | Admitting: *Deleted

## 2015-08-17 ENCOUNTER — Ambulatory Visit (HOSPITAL_BASED_OUTPATIENT_CLINIC_OR_DEPARTMENT_OTHER): Payer: Medicare Other | Admitting: Internal Medicine

## 2015-08-17 ENCOUNTER — Ambulatory Visit
Admission: RE | Admit: 2015-08-17 | Discharge: 2015-08-17 | Disposition: A | Discharge status: Home / Self Care | Payer: Medicare Other | Source: Ambulatory Visit | Attending: Radiation Oncology | Admitting: Radiation Oncology

## 2015-08-17 ENCOUNTER — Ambulatory Visit (HOSPITAL_COMMUNITY): Payer: Medicare Other

## 2015-08-17 ENCOUNTER — Encounter: Payer: Self-pay | Admitting: Internal Medicine

## 2015-08-17 VITALS — BP 140/86 | HR 18 | Temp 97.5°F | Resp 18 | Ht 65.0 in | Wt 169.7 lb

## 2015-08-17 DIAGNOSIS — C3432 Malignant neoplasm of lower lobe, left bronchus or lung: Secondary | ICD-10-CM

## 2015-08-17 DIAGNOSIS — M546 Pain in thoracic spine: Secondary | ICD-10-CM | POA: Diagnosis present

## 2015-08-17 DIAGNOSIS — R5381 Other malaise: Secondary | ICD-10-CM | POA: Insufficient documentation

## 2015-08-17 DIAGNOSIS — R293 Abnormal posture: Secondary | ICD-10-CM

## 2015-08-17 LAB — CBC WITH DIFFERENTIAL/PLATELET
BASO%: 0.5 % (ref 0.0–2.0)
Basophils Absolute: 0.1 10*3/uL (ref 0.0–0.1)
EOS%: 2.8 % (ref 0.0–7.0)
Eosinophils Absolute: 0.3 10*3/uL (ref 0.0–0.5)
HCT: 44.4 % (ref 38.4–49.9)
HGB: 14.8 g/dL (ref 13.0–17.1)
LYMPH%: 25.7 % (ref 14.0–49.0)
MCH: 30.6 pg (ref 27.2–33.4)
MCHC: 33.3 g/dL (ref 32.0–36.0)
MCV: 91.9 fL (ref 79.3–98.0)
MONO#: 0.8 10*3/uL (ref 0.1–0.9)
MONO%: 7.5 % (ref 0.0–14.0)
NEUT#: 6.5 10*3/uL (ref 1.5–6.5)
NEUT%: 63.5 % (ref 39.0–75.0)
Platelets: 276 10*3/uL (ref 140–400)
RBC: 4.83 10*6/uL (ref 4.20–5.82)
RDW: 13.3 % (ref 11.0–14.6)
WBC: 10.2 10*3/uL (ref 4.0–10.3)
lymph#: 2.6 10*3/uL (ref 0.9–3.3)

## 2015-08-17 LAB — COMPREHENSIVE METABOLIC PANEL (CC13)
ALT: 14 U/L (ref 0–55)
AST: 13 U/L (ref 5–34)
Albumin: 3.4 g/dL — ABNORMAL LOW (ref 3.5–5.0)
Alkaline Phosphatase: 90 U/L (ref 40–150)
Anion Gap: 7 mEq/L (ref 3–11)
BUN: 16.3 mg/dL (ref 7.0–26.0)
CO2: 28 mEq/L (ref 22–29)
Calcium: 9.6 mg/dL (ref 8.4–10.4)
Chloride: 106 mEq/L (ref 98–109)
Creatinine: 0.8 mg/dL (ref 0.7–1.3)
EGFR: 89 mL/min/{1.73_m2} — ABNORMAL LOW (ref >90)
Glucose: 83 mg/dl (ref 70–140)
Potassium: 4.8 mEq/L (ref 3.5–5.1)
Sodium: 140 mEq/L (ref 136–145)
Total Bilirubin: 0.34 mg/dL (ref 0.20–1.20)
Total Protein: 6.7 g/dL (ref 6.4–8.3)

## 2015-08-17 NOTE — Progress Notes (Signed)
Oncology Nurse Navigator Documentation  Oncology Nurse Navigator Flowsheets 08/17/2015  Navigator Encounter Type Clinic/MDC/spoke with patient today at thoracic clinic.  Gave him information on NSCLC squamous histology.  I also gave him resources at the cancer center to help him with his cancer journey.  Patient is set up to see thoracic surgery on 08/22/15 at 2:00.  He was given their contact information and address.   I asked him if he quit smoking.  His wife stated he has not smoked today. I encouraged him on his efforts and gave him information on what to expect when he quits.  I asked that he call me if he starts smoking again and we could go over tips to quit.   Patient Visit Type Follow-up  Treatment Phase Abnormal Scans  Interventions Coordination of Care  Coordination of Care MD Appointments  Time Spent with Patient 30

## 2015-08-17 NOTE — Therapy (Signed)
[place], [place], [address] Phone: [phone]   Fax:  [phone]  Physical Therapy Evaluation  Patient Details  Name: Robert Bates MRN: [id number] Date of Birth: [birth year] Referring Provider:  Curt Bears, MD  Encounter Date: 08/17/2015      PT End of Session - 08/17/15 1512    Visit Number 1   Number of Visits 13   Date for PT Re-Evaluation 10/06/15   PT Start Time 0370   PT Stop Time 1447   PT Time Calculation (min) 32 min   Activity Tolerance Patient tolerated treatment well   Behavior During Therapy [facility] for tasks assessed/performed      Past Medical History  Diagnosis Date  . GERD (gastroesophageal reflux disease)   . Alcoholism ([place])     past problem per pt but admits to still drinking - unclear how much  . Tobacco abuse   . Hematuria     refuses work up or referral - understands risks of morbidity / mortality - 11/2008, 12/2008  . Hyperlipemia   . Kidney stones   . Meningioma ([place]) 10/25/2013    Follows with Dr. [name].     Past Surgical History  Procedure Laterality Date  . Tonsillectomy    . Hernia repair    . Eye surgery      Cataracts removed  . Video bronchoscopy Bilateral 07/26/2015    Procedure: VIDEO BRONCHOSCOPY WITH FLUORO;  Surgeon: [name], MD;  Location: [facility];  Service: Cardiopulmonary;  Laterality: Bilateral;    There were no vitals filed for this visit.  Visit Diagnosis:  Abnormal posture - Plan: PT plan of care cert/re-cert  Midline thoracic back pain - Plan: PT plan of care cert/re-cert  Physical deconditioning - Plan: PT plan of care cert/re-cert      Subjective Assessment - 08/17/15 1500    Subjective having mid-back pain   Patient is accompained by: Family member  wife   Pertinent History Patient had been followed for a left upper lobe ground glass opacity for the last 2 years.  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  Increased activity at L3 spinous process on PET was biopsied and was found to be negative.  Will have a mediastinoscopy next to check suspicious lymph node; if it is negative, he may be a candidate for resection; will also meet with medical oncology to be treated or followe as needed.                                                                              Patient Stated Goals get information from all lung clinic providers   Currently in Pain? Yes   Pain Score 5    Pain Location Back   Pain Orientation Mid   Pain Descriptors / Indicators Other (Comment)  "just pain"   Pain Frequency Intermittent   Aggravating Factors  sitting, driving, lying down   Pain Relieving Factors rest            [facility] PT Assessment - 08/17/15 0001    Assessment   Medical  Diagnosis squamous cell carinoma of left lung, stage I or II   Onset Date/Surgical Date 06/23/15   Precautions   Precautions Other (comment)   Precaution Comments cancer precautions   Restrictions   Weight Bearing Restrictions No   Balance Screen   Has the patient fallen in the past 6 months No  but slipped on ice last winter   Has the patient had a decrease in activity level because of a fear of falling?  No   Is the patient reluctant to leave their home because of a fear of falling?  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   Leisure no regular exercise other than work   Cognition   Overall Cognitive Status Difficult to assess  wife reports he forgets and needs her to keep track of thing   Observation/Other Assessments   Observations  unremarkable   Functional Tests   Functional tests Sit to Stand   Sit to Stand   Comments 9 times in 30 seconds; below average for her age   Posture/Postural Control   Posture/Postural Control Postural limitations   Postural Limitations Forward head;Rounded Shoulders;Increased thoracic kyphosis   Posture Comments significant forward head   ROM / Strength   AROM / PROM / Strength AROM   AROM   Overall AROM Comments reaches 15 inches fingers to floor in forward flexion in standing; extension shows 25% loss; other motions [facility]   Ambulation/Gait   Ambulation/Gait Yes   Ambulation/Gait Assistance 7: Independent   Balance   Balance Assessed Yes   Dynamic Standing Balance   Dynamic Standing - Comments reaches 9 inches forward in standing, then is afraid of pain so doesn't go farther; this is below average for age                           PT Education - 08/17/15 1511    Education provided Yes   Education Details posture, breathing, energy conservation, walking or other exercise, Cure article on staying active, cough splinting, PT info   Person(s) Educated Patient;Spouse   Methods Explanation;Handout   Comprehension Verbalized understanding               Lung Clinic Goals - 08/17/15 1543    Patient will be able to verbalize understanding of the benefit of exercise to decrease fatigue.   Status Achieved   Patient will be able to verbalize the importance of posture.   Status Achieved   Patient will be able to demonstrate diaphragmatic breathing for improved lung function.   Status Achieved   Patient will be able to verbalize understanding of the role of physical therapy to prevent functional decline and who to contact if physical therapy is needed.   Status Achieved             Plan - 08/17/15 1513    Clinical Impression Statement Pleasant gentleman with newly diagnosed lung cancer who has poor posture, back pain, and doesn't currently exercise.  He  would likely benefit from a physical therapy exercise program.  Pt will benefit from skilled therapeutic intervention in order to improve on the following deficits Pain;Postural dysfunction;Cardiopulmonary status limiting activity   Rehab Potential Good   PT Frequency 3x / week   PT Duration 4 weeks   PT Treatment/Interventions Patient/family education   PT Next Visit Plan If therapy is warranted after further workup, do 6 min. walk test and begin exercise for endurance and strengthening.   PT Home Exercise Plan see education section   Consulted and Agree with Plan of Care Patient          G-Codes - 08/28/2015 1543    Functional Assessment Tool Used clinical judgement   Functional Limitation Changing and maintaining body position   Changing and Maintaining Body Position Current Status [phone]) At least 20 percent but less than 40 percent impaired, limited or restricted   Changing and Maintaining Body Position Goal Status (B5670) At least 1 percent but less than 20 percent impaired, limited or restricted       Problem List Patient Active Problem List   Diagnosis Date Noted  . Neck pain 02/06/2015  . Lung cancer ([place]) 11/07/2014  . 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 Wellness examination 09/27/2014  . HTN (hypertension) 04/29/2014  . 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 Low back pain 10/25/2013  . 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 Cerumen impaction 08/18/2012  . Abnormal chest CT 08/18/2012  . KERATOSIS 10/09/2010  . SCIATICA, RIGHT 10/09/2010  . UNSPECIFIED HEARING LOSS 05/21/2010  . Hyperlipemia 05/14/2010  . LEUKOCYTOSIS 05/14/2010  . 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 LIPOMA 11/17/2009  . Cigarette smoker 11/24/2008  . MICROSCOPIC HEMATURIA 08/11/2008  . GERD 07/26/2008  . DERMOID CYST 07/25/2008    Rubee Vega 08-28-2015, 3:46 PM  [place], [place], [address] Phone: [phone]   Fax:  [place], PT Aug 28, 2015 3:46 PM

## 2015-08-17 NOTE — Progress Notes (Signed)
[place] Clinical Social Work  Clinical Social Work met with patient/family at [employer] appointment to offer support and assess for psychosocial needs.  Patient was accompanied by his spouse today.  He reported no concerns at this time and is eager to meet with the surgeon and "get started".  The patient works as a Administrator and had questions on whether he would be able to work.  CSW encouraged patient to talk with surgeon about work limitations.  Clinical Social Work briefly discussed Clinical Social Work role and [employer] support programs/services.  Clinical Social Work encouraged patient to call with any additional questions or concerns.   Polo Riley, MSW, LCSW, OSW-C Clinical Social Worker [facility] [phone]

## 2015-08-18 NOTE — Progress Notes (Signed)
[place] [phone]   [phone]  OFFICE PROGRESS NOTE  Nance Pear., NP [place] [address]  DIAGNOSIS: Stage IIB (T3, N0, M0) non-small cell lung cancer, squamous cell carcinoma presented with left lower lobe endobronchial lesion as well as suspicious groundglass opacity in the left upper lobe diagnosed in September 2016.  PRIOR THERAPY: None  CURRENT THERAPY: None  INTERVAL HISTORY: Robert Bates 74 y.o. male returns to the clinic today for follow-up visit accompanied by his wife. The patient is feeling fine today with no specific complaints. His previous PET scan showed a suspicious hypermetabolic lesion in the spinous process of the L3 vertebra. I recommended for the patient to have CT-guided biopsy of this lesion by interventional radiology and this was performed on 08/14/2015 and the final pathology showed no evidence for malignancy. The patient is here today for reevaluation and discussion of his treatment options. Unfortunately he continues to smoke a few cigarettes every day. I strongly encouraged him to quit smoking and offered him a smoke cessation program. The patient denied having any significant chest pain but continues to have mild shortness breath with exertion with mild cough and no hemoptysis. He denied having any significant weight loss or night sweats. He has no headache or visual changes. His recent MRI of the brain showed no evidence for metastatic disease to the brain.  MEDICAL HISTORY: Past Medical History  Diagnosis Date  . GERD (gastroesophageal reflux disease)   . Alcoholism ([place])     past problem per pt but admits to still drinking - unclear how much  . Tobacco abuse   . Hematuria     refuses work up or referral - understands risks of morbidity / mortality - 11/2008, 12/2008  . Hyperlipemia   . Kidney stones   . Meningioma ([place]) 10/25/2013    Follows with Dr. [name].      ALLERGIES:  is allergic to iodine.  MEDICATIONS:  Current Outpatient Prescriptions  Medication Sig Dispense Refill  . acetaminophen (TYLENOL) 500 MG tablet Take 1,000 mg by mouth every 6 (six) hours as needed for mild pain.    [name] aspirin EC 81 MG tablet Take 81 mg by mouth daily.    . Aspirin-Acetaminophen-Caffeine (GOODY HEADACHE PO) Take 1 Dose by mouth daily as needed.    . meloxicam (MOBIC) 7.5 MG tablet Take 1 tablet (7.5 mg total) by mouth daily. (Patient taking differently: Take 7.5 mg by mouth daily as needed. ) 30 tablet 2  . omeprazole (PRILOSEC) 40 MG capsule Take 1 capsule (40 mg total) by mouth daily. 90 capsule 1  . pravastatin (PRAVACHOL) 80 MG tablet Take 1 tablet (80 mg total) by mouth daily. 90 tablet 1  . ibuprofen (ADVIL,MOTRIN) 200 MG tablet Take 400 mg by mouth every 6 (six) hours as needed for mild pain.     No current facility-administered medications for this visit.    SURGICAL HISTORY:  Past Surgical History  Procedure Laterality Date  . Tonsillectomy    . Hernia repair    . Eye surgery      Cataracts removed  . Video bronchoscopy Bilateral 07/26/2015    Procedure: VIDEO BRONCHOSCOPY WITH FLUORO;  Surgeon: [name], MD;  Location: [facility];  Service: Cardiopulmonary;  Laterality: Bilateral;    REVIEW OF SYSTEMS:  Constitutional: negative Eyes: negative Ears, nose, mouth, throat, and face: negative Respiratory: positive for cough and dyspnea on exertion Cardiovascular:  negative Gastrointestinal: negative Genitourinary:negative Integument/breast: negative Hematologic/lymphatic: negative Musculoskeletal:negative Neurological: negative Behavioral/Psych: negative Endocrine: negative Allergic/Immunologic: negative   PHYSICAL EXAMINATION: General appearance: alert, cooperative and no distress Head: Normocephalic, without obvious abnormality, atraumatic Neck: no adenopathy, no JVD, supple, symmetrical, trachea midline and thyroid not  enlarged, symmetric, no tenderness/mass/nodules Lymph nodes: Cervical, supraclavicular, and axillary nodes normal. Resp: clear to auscultation bilaterally Back: symmetric, no curvature. ROM normal. No CVA tenderness. Cardio: regular rate and rhythm, S1, S2 normal, no murmur, click, rub or gallop GI: soft, non-tender; bowel sounds normal; no masses,  no organomegaly Extremities: extremities normal, atraumatic, no cyanosis or edema Neurologic: Alert and oriented X 3, normal strength and tone. Normal symmetric reflexes. Normal coordination and gait  ECOG PERFORMANCE STATUS: 1 - Symptomatic but completely ambulatory  Blood pressure 140/86, pulse 18, temperature 97.5 F (36.4 C), temperature source Oral, resp. rate 18, height '5\' 5"'$  (1.651 m), weight 169 lb 11.2 oz (76.975 kg), SpO2 99 %.  LABORATORY DATA: Lab Results  Component Value Date   WBC 10.2 08/17/2015   HGB 14.8 08/17/2015   HCT 44.4 08/17/2015   MCV 91.9 08/17/2015   PLT 276 08/17/2015      Chemistry      Component Value Date/Time   NA 140 08/17/2015 1319   NA 138 09/27/2014 1159   K 4.8 08/17/2015 1319   K 4.0 09/27/2014 1159   CL 101 09/27/2014 1159   CO2 28 08/17/2015 1319   CO2 27 09/27/2014 1159   BUN 16.3 08/17/2015 1319   BUN 19 09/27/2014 1159   CREATININE 0.8 08/17/2015 1319   CREATININE 0.9 09/27/2014 1159   CREATININE 0.84 07/30/2013 1158      Component Value Date/Time   CALCIUM 9.6 08/17/2015 1319   CALCIUM 8.9 09/27/2014 1159   ALKPHOS 90 08/17/2015 1319   ALKPHOS 66 09/27/2014 1159   AST 13 08/17/2015 1319   AST 15 09/27/2014 1159   ALT 14 08/17/2015 1319   ALT 13 09/27/2014 1159   BILITOT 0.34 08/17/2015 1319   BILITOT 0.5 09/27/2014 1159       RADIOGRAPHIC STUDIES: Mr Jeri Cos Wo Contrast  14-Sep-2015  CLINICAL DATA:  Recently diagnosed left lower lobe lung cancer. Staging. EXAM: MRI HEAD WITHOUT AND WITH CONTRAST TECHNIQUE: Multiplanar, multiecho pulse sequences of the brain and  surrounding structures were obtained without and with intravenous contrast. CONTRAST:  72m MULTIHANCE GADOBENATE DIMEGLUMINE 529 MG/ML IV SOLN COMPARISON:  111/10/2014and 07/31/2013 FINDINGS: Some sequences are mildly motion degraded. There is mild generalized cerebral atrophy. No acute infarct, intracranial hemorrhage, intra-axial mass, midline shift, or extra-axial fluid collection is identified. Small foci of T2 hyperintensity in the subcortical and deep cerebral white matter are nonspecific but compatible with minimal chronic small vessel ischemic disease, less than is often seen in patients of this age. Homogeneously enhancing extra-axial mass with dural tail along the planum sphenoidale has not significantly changed in size, measuring 1.6 x 1.4 x 1.0 cm (previously 1.7 x 1.6 x 0.9 cm). This mass again extends slightly into the anterior aspect of the sella. The prechiasmatic right optic nerve is again noted to be in close proximity to this mass. Extra-axial homogeneously enhancing mass at the right frontal skull vertex is also unchanged in size, measuring 3.2 x 2.6 x 2.1 cm. There is mild mass effect on the underlying right frontal lobe with very mild brain edema, increased from prior. There is no evidence of superior sagittal sinus invasion. No new enhancing lesions are identified. Prior bilateral  cataract extraction is noted. There is chronic right maxillary sinusitis with moderate circumferential mucosal thickening as well as central fluid. A large mucous retention cyst and small amount of fluid are present in the left maxillary sinus. There is also mucosal thickening in subtotal opacification of the right frontal sinus and mild mucosal thickening in the left frontal sinus, and there is opacification of multiple anterior ethmoid air cells bilaterally. Mild left sphenoid sinus mucosal thickening is noted. No significant mastoid effusion is seen. Major intracranial vascular flow voids are preserved, with the  left vertebral artery being dominant. IMPRESSION: 1. No evidence of intracranial metastases. 2. Unchanged right frontal vertex meningioma. Increased, very mild edema in the adjacent frontal lobe. 3. Unchanged planum sphenoidale meningioma. 4. Acute on chronic sinusitis. Electronically Signed   By: Logan Bores M.D.   On: 08/15/2015 21:14   Ct Biopsy  08/14/2015  CLINICAL DATA:  Hypermetabolic left hilar lesion. Focus of hypermetabolic activity at the margin of the lumbar L3 vertebral spinous process. EXAM: CT GUIDED CORE BONE BIOPSY OF LUMBAR L3 SPINOUS PROCESS LESION ANESTHESIA/SEDATION: Intravenous Fentanyl and Versed were administered as conscious sedation during continuous cardiorespiratory monitoring by the radiology RN, with a total moderate sedation time of 12 minutes. PROCEDURE: The procedure risks, benefits, and alternatives were explained to the patient. Questions regarding the procedure were encouraged and answered. The patient understands and consents to the procedure. Patient placed prone. Select axial scans through the lumbar spine obtained. The lesion was localized and an appropriate skin entry site was determined and marked. The operative field was prepped with chlorhexidinein a sterile fashion, and a sterile drape was applied covering the operative field. A sterile gown and sterile gloves were used for the procedure. Local anesthesia was provided with 1% Lidocaine. Under CT fluoroscopic guidance, a 11 gauge cook osteosite bone needle was advanced to the margin of the lesion. Once needle tip position was confirmed, multiple core biopsy samples were obtained, submitted in formalin to surgical pathology. The needle was removed. Postprocedure scans reveal no hematoma or other apparent complication. The patient tolerated the procedure well. COMPLICATIONS: None immediate FINDINGS: The bone abnormality corresponding to the site hypermetabolic activity on PET-CT was localized. Multiple core samples  obtained of this region under CT fluoroscopic guidance as above. IMPRESSION: 1. Technically successful core bone biopsy of L3 spinous process lesion under CT guidance. Electronically Signed   By: Lucrezia Europe M.D.   On: 08/14/2015 15:00    ASSESSMENT AND PLAN: This is a very pleasant 73 years old white male with questionably stage IIb non-small cell lung cancer, squamous cell carcinoma presented with left lower lobe endobronchial lesion in addition to suspicious groundglass opacity in the left upper lobe. The CT hypermetabolic lesion seen on the previous PET scan was biopsied and showed no evidence for malignancy. I had a lengthy discussion with the patient and his wife today about his current condition and treatment options. I recommended for the patient to see Dr. Servando Snare for consideration and evaluation of surgical resection. If the patient is not a good surgical candidate he may benefit from stereotactic radiotherapy to the suspicious left lung lesions. I strongly encouraged the patient to quit smoking and offered him a smoke cessation program. I will see the patient back for follow-up visit after his surgical evaluation for close monitoring and discussion of adjuvant therapy if needed. The patient was seen during the multidisciplinary thoracic oncology clinic today by medical oncology, thoracic navigator, social worker and physical therapist. He was advised  to call immediately if he has any concerning symptoms in the interval. The patient voices understanding of current disease status and treatment options and is in agreement with the current care plan.  All questions were answered. The patient knows to call the clinic with any problems, questions or concerns. We can certainly see the patient much sooner if necessary.  I spent 20 minutes counseling the patient face to face. The total time spent in the appointment was 30 minutes.  Disclaimer: This note was dictated with voice recognition software.  Similar sounding words can inadvertently be transcribed and may not be corrected upon review.

## 2015-08-22 ENCOUNTER — Encounter (HOSPITAL_COMMUNITY): Payer: Self-pay | Admitting: *Deleted

## 2015-08-22 ENCOUNTER — Telehealth: Payer: Self-pay | Admitting: *Deleted

## 2015-08-22 ENCOUNTER — Encounter: Payer: Self-pay | Admitting: Cardiothoracic Surgery

## 2015-08-22 ENCOUNTER — Other Ambulatory Visit: Payer: Self-pay | Admitting: *Deleted

## 2015-08-22 ENCOUNTER — Institutional Professional Consult (permissible substitution) (INDEPENDENT_AMBULATORY_CARE_PROVIDER_SITE_OTHER): Payer: Medicare Other | Admitting: Cardiothoracic Surgery

## 2015-08-22 VITALS — BP 122/76 | HR 52 | Resp 16 | Ht 69.0 in | Wt 170.0 lb

## 2015-08-22 DIAGNOSIS — E0590 Thyrotoxicosis, unspecified without thyrotoxic crisis or storm: Secondary | ICD-10-CM

## 2015-08-22 DIAGNOSIS — D381 Neoplasm of uncertain behavior of trachea, bronchus and lung: Secondary | ICD-10-CM | POA: Diagnosis not present

## 2015-08-22 DIAGNOSIS — C3432 Malignant neoplasm of lower lobe, left bronchus or lung: Secondary | ICD-10-CM

## 2015-08-22 DIAGNOSIS — R911 Solitary pulmonary nodule: Secondary | ICD-10-CM

## 2015-08-22 NOTE — Progress Notes (Signed)
[name]       [name] [address]             [phone]                    Robert Bates #[id number] Date of Birth: 11-23-1940  Referring: Robert Bears, MD Primary Care: Robert Bates., NP  Chief Complaint:    Chief Complaint  Patient presents with  . Lung Cancer    LLLOBE...CT CHEST 8/19, PET 9/13, CT BX 1010, MR BRAIN 10/11 and SPIROMETRY 07/04/15  . Lung Lesion    LULOBE   Stage IIB (T3, N0, M0) non-small cell lung cancer, squamous cell carcinoma presented with left lower lobe endobronchial lesion as well as suspicious groundglass opacity in the left upper lobe diagnosed in September 2016.  History of Present Illness:    Robert Bates 74 y.o. male is seen in the office  today for evaluation of left lower non small cell lung cancer bx by bronchoscopy. In addition he has a enlarging  Ground glass mass in the left upper lobe present for at least 2 years. Patient had needle biopsy of hypermetabolic spine lesion , was negative for mets. Patien tis lower term smoker smoking since age 21 more then pack per day.        Current Activity/ Functional Status:  Patient is independent with mobility/ambulation, transfers, ADL's, IADL's.   Zubrod Score: At the time of surgery this patient's most appropriate activity status/level should be described as: '[]'$     0    Normal activity, no symptoms '[x]'$     1    Restricted in physical strenuous activity but ambulatory, able to do out light work '[]'$     2    Ambulatory and capable of self care, unable to do work activities, up and about               >50 % of waking hours                              '[]'$     3    Only limited self care, in bed greater than 50% of waking hours '[]'$     4    Completely disabled, no self care, confined to bed or chair '[]'$     5    Moribund   Past Medical History  Diagnosis Date  . GERD (gastroesophageal reflux disease)   . Alcoholism ([place])     past problem  per pt but admits to still drinking - unclear how much  . Tobacco abuse   . Hematuria     refuses work up or referral - understands risks of morbidity / mortality - 11/2008, 12/2008  . Hyperlipemia   . Kidney stones   . Meningioma ([place]) 10/25/2013    Follows with Dr. [name].     Past Surgical History  Procedure Laterality Date  . Tonsillectomy    . Hernia repair    . Eye surgery      Cataracts removed  . Video bronchoscopy Bilateral 07/26/2015    Procedure: VIDEO BRONCHOSCOPY WITH FLUORO;  Surgeon: [name], MD;  Location: [facility];  Service: Cardiopulmonary;  Laterality: Bilateral;    Family History  Problem Relation Age of Onset  . Leukemia Father   . Learning disabilities Son   . Leukemia Other   . Stroke Other   .  Emphysema Father     Social History   Social History  . Marital Status: Married    Spouse Name: N/A  . Number of Children: 2  . Years of Education: N/A   Occupational History  . Retired     Administrator  .     Social History Main Topics  . Smoking status: Current Every Day Smoker -- 1.00 packs/day for 57 years  . Smokeless tobacco: Former Systems developer    Types: Chew    Quit date: 11/04/1958     Comment: Started smoking at age 47.  Currently smoking 1 ppd.  . Alcohol Use: 0.0 oz/week    0 Standard drinks or equivalent per week     Comment: Pt reports rare use  . Drug Use: Not on file  . Sexual Activity: Not on file   Other Topics Concern  . Not on file   Social History Narrative    History  Smoking status  . Current Every Day Smoker -- 1.00 packs/day for 57 years  Smokeless tobacco  . Former Systems developer  . Types: Chew  . Quit date: 11/04/1958    Comment: Started smoking at age 58.  Currently smoking 1 ppd.    History  Alcohol Use  . 0.0 oz/week  . 0 Standard drinks or equivalent per week    Comment: Pt reports rare use     Allergies  Allergen Reactions  . Iodine     REACTION: neck swells    Current Outpatient Prescriptions    Medication Sig Dispense Refill  . acetaminophen (TYLENOL) 500 MG tablet Take 1,000 mg by mouth every 6 (six) hours as needed for mild pain.    [name] aspirin EC 81 MG tablet Take 81 mg by mouth daily.    . Aspirin-Acetaminophen-Caffeine (GOODY HEADACHE PO) Take 1 Dose by mouth daily as needed.    [name] ibuprofen (ADVIL,MOTRIN) 200 MG tablet Take 400 mg by mouth every 6 (six) hours as needed for mild pain.    . meloxicam (MOBIC) 7.5 MG tablet Take 1 tablet (7.5 mg total) by mouth daily. (Patient taking differently: Take 7.5 mg by mouth daily as needed. ) 30 tablet 2  . omeprazole (PRILOSEC) 40 MG capsule Take 1 capsule (40 mg total) by mouth daily. 90 capsule 1  . pravastatin (PRAVACHOL) 80 MG tablet Take 1 tablet (80 mg total) by mouth daily. 90 tablet 1   No current facility-administered medications for this visit.      Review of Systems:     Cardiac Review of Systems: Y or N  Chest Pain [  n  ]  Resting SOB [ n  ] Exertional SOB  Blue.Reese  ]  Orthopnea [ n ]   Pedal Edema [ n  ]    Palpitations [ n ] Syncope  [  n]   Presyncope [ n  ]  General Review of Systems: [Y] = yes [  ]=no Constitional: recent weight change [n  ];  Wt loss over the last 3 months [   ] anorexia [  ]; fatigue [  ]; nausea [  ]; night sweats [ n ]; fever [ n ]; or chills [  ];          Dental: poor dentition[  ]; Last Dentist visit:   Eye : blurred vision [  ]; diplopia [   ]; vision changes [  ];  Amaurosis fugax[  ]; Resp: cough [  ];  wheezing[ y ];  hemoptysis[n  ]; shortness of breath[n  ]; paroxysmal nocturnal dyspnea[  ]; dyspnea on exertion[  ]; or orthopnea[  ];  GI:  gallstones[  ], vomiting[  n];  dysphagia[  ]; melena[  ];  hematochezia [  ]; heartburn[  ];   Hx of  Colonoscopy[  ]; GU: kidney stones [  ]; hematuria[  ];   dysuria [  ];  nocturia[  ];  history of     obstruction [  ]; urinary frequency [  ]             Skin: rash, swelling[  ];, hair loss[  ];  peripheral edema[  ];  or itching[  ]; Musculosketetal:  myalgias[  ];  joint swelling[  ];  joint erythema[  ];  joint pain[  ];  back pain[  ];  Heme/Lymph: bruising[  ];  bleeding[  ];  anemia[  ];  Neuro: TIA[  ];  headaches[  ];  stroke[  ];  vertigo[  ];  seizures[  ];   paresthesias[  ];  difficulty walking[  ];  Psych:depression[  ]; anxiety[  ];  Endocrine: diabetes[ n ];  thyroid dysfunction[ n ];  Immunizations: Flu up to date [  ]; Pneumococcal up to date [  ];  Other:  Physical Exam: BP 122/76 mmHg  Pulse 52  Resp 16  Ht '5\' 9"'$  (1.753 m)  Wt 170 lb (77.111 kg)  BMI 25.09 kg/m2  SpO2 97%  PHYSICAL EXAMINATION: General appearance: alert, cooperative and no distress Head: Normocephalic, without obvious abnormality, atraumatic Neck: no adenopathy, no carotid bruit, no JVD, supple, symmetrical, trachea midline and thyroid not enlarged, symmetric, no tenderness/mass/nodules Lymph nodes: Cervical, supraclavicular, and axillary nodes normal. Resp: diminished breath sounds bilaterally Back: symmetric, no curvature. ROM normal. No CVA tenderness. Cardio: regular rate and rhythm, S1, S2 normal, no murmur, click, rub or gallop GI: soft, non-tender; bowel sounds normal; no masses,  no organomegaly Extremities: extremities normal, atraumatic, no cyanosis or edema and Homans sign is negative, no sign of DVT Neurologic: Grossly normal  Diagnostic Studies & Laboratory data:     Recent Radiology Findings:   Mr Kizzie Fantasia Contrast  08/15/2015  CLINICAL DATA:  Recently diagnosed left lower lobe lung cancer. Staging. EXAM: MRI HEAD WITHOUT AND WITH CONTRAST TECHNIQUE: Multiplanar, multiecho pulse sequences of the brain and surrounding structures were obtained without and with intravenous contrast. CONTRAST:  12m MULTIHANCE GADOBENATE DIMEGLUMINE 529 MG/ML IV SOLN COMPARISON:  08/14/2013 and 07/31/2013 FINDINGS: Some sequences are mildly motion degraded. There is mild generalized cerebral atrophy. No acute infarct, intracranial hemorrhage,  intra-axial mass, midline shift, or extra-axial fluid collection is identified. Small foci of T2 hyperintensity in the subcortical and deep cerebral white matter are nonspecific but compatible with minimal chronic small vessel ischemic disease, less than is often seen in patients of this age. Homogeneously enhancing extra-axial mass with dural tail along the planum sphenoidale has not significantly changed in size, measuring 1.6 x 1.4 x 1.0 cm (previously 1.7 x 1.6 x 0.9 cm). This mass again extends slightly into the anterior aspect of the sella. The prechiasmatic right optic nerve is again noted to be in close proximity to this mass. Extra-axial homogeneously enhancing mass at the right frontal skull vertex is also unchanged in size, measuring 3.2 x 2.6 x 2.1 cm. There is mild mass effect on the underlying right frontal lobe with very mild brain edema, increased from prior. There is no evidence of superior  sagittal sinus invasion. No new enhancing lesions are identified. Prior bilateral cataract extraction is noted. There is chronic right maxillary sinusitis with moderate circumferential mucosal thickening as well as central fluid. A large mucous retention cyst and small amount of fluid are present in the left maxillary sinus. There is also mucosal thickening in subtotal opacification of the right frontal sinus and mild mucosal thickening in the left frontal sinus, and there is opacification of multiple anterior ethmoid air cells bilaterally. Mild left sphenoid sinus mucosal thickening is noted. No significant mastoid effusion is seen. Major intracranial vascular flow voids are preserved, with the left vertebral artery being dominant. IMPRESSION: 1. No evidence of intracranial metastases. 2. Unchanged right frontal vertex meningioma. Increased, very mild edema in the adjacent frontal lobe. 3. Unchanged planum sphenoidale meningioma. 4. Acute on chronic sinusitis. Electronically Signed   By: Logan Bores M.D.   On:  08/15/2015 21:14   Ct Biopsy  08/14/2015  CLINICAL DATA:  Hypermetabolic left hilar lesion. Focus of hypermetabolic activity at the margin of the lumbar L3 vertebral spinous process. EXAM: CT GUIDED CORE BONE BIOPSY OF LUMBAR L3 SPINOUS PROCESS LESION ANESTHESIA/SEDATION: Intravenous Fentanyl and Versed were administered as conscious sedation during continuous cardiorespiratory monitoring by the radiology RN, with a total moderate sedation time of 12 minutes. PROCEDURE: The procedure risks, benefits, and alternatives were explained to the patient. Questions regarding the procedure were encouraged and answered. The patient understands and consents to the procedure. Patient placed prone. Select axial scans through the lumbar spine obtained. The lesion was localized and an appropriate skin entry site was determined and marked. The operative field was prepped with chlorhexidinein a sterile fashion, and a sterile drape was applied covering the operative field. A sterile gown and sterile gloves were used for the procedure. Local anesthesia was provided with 1% Lidocaine. Under CT fluoroscopic guidance, a 11 gauge cook osteosite bone needle was advanced to the margin of the lesion. Once needle tip position was confirmed, multiple core biopsy samples were obtained, submitted in formalin to surgical pathology. The needle was removed. Postprocedure scans reveal no hematoma or other apparent complication. The patient tolerated the procedure well. COMPLICATIONS: None immediate FINDINGS: The bone abnormality corresponding to the site hypermetabolic activity on PET-CT was localized. Multiple core samples obtained of this region under CT fluoroscopic guidance as above. IMPRESSION: 1. Technically successful core bone biopsy of L3 spinous process lesion under CT guidance. Electronically Signed   By: Lucrezia Europe M.D.   On: 08/14/2015 15:00  PET:  CLINICAL DATA: Initial treatment strategy for Lung cancer.  EXAM: NUCLEAR  MEDICINE PET SKULL BASE TO THIGH  TECHNIQUE: 8.45 mCi F-18 FDG was injected intravenously. Full-ring PET imaging was performed from the skull base to thigh after the radiotracer. CT data was obtained and used for attenuation correction and anatomic localization.  FASTING BLOOD GLUCOSE: Value: 9.6 mg/dl  COMPARISON: 06/23/2015  FINDINGS: NECK  Within the upper pole of the right lobe of thyroid gland there is a hypermetabolic nodule measuring 1.4 cm and has an SUV max equal to 5.74. No hypermetabolic cervical lymph nodes identified.  CHEST  Right paratracheal lymph node is normal measuring 8 mm but has an SUV max equal to 4.19. Mild FDG uptake associated with sub carinal lymph node has an SUV max equal to 3.86. Low attenuation nodule within the left adrenal gland measures 1.5 cm and is unchanged from 7/ There is a endobronchial lesion within the left lower lobe bronchi measuring approximately 2 cm. This  has an SUV max equal to 23.8. And is worrisome for primary bronchogenic carcinoma. Sub solid scratch sec the pure ground-glass attenuating nodule within the left upper lobe measures 2.8 cm and has an SUV max equal to 1.25.  ABDOMEN/PELVIS  No abnormal hypermetabolic activity within the liver, pancreas, adrenal glands, or spleen. There is a large hiatal hernia identified. Aortic atherosclerosis noted. The infrarenal abdominal aorta has a AP diameter of 2.8 cm. No hypermetabolic lymph nodes in the abdomen or pelvis.  SKELETON  Hypermetabolic lesion involving the spinous process of the L3 vertebra is identified within SUV max equal to 5.7. Suspicious for bone metastasis.  IMPRESSION: 1. There is an endobronchial lesion within the left lower lobe airway which is concerning for primary bronchogenic carcinoma. There is a hypermetabolic lesion involving the spinous process of the L3 vertebra, suspicious for bone metastasis. 2. Nonspecific FDG uptake is identified  within normal-sized right paratracheal and sub- carinal lymph nodes. 3. Persistent, suspicious pure ground-glass attenuating nodule within the left upper lobe exhibits low level FDG uptake. Note, PET-CT is of limited value in evaluating pure ground-glass nodules greater than 5 mm and is potentially misleading and therefore not recommended. However, given the interval increase in size this remains worrisome for pulmonary adenocarcinoma. 4. Hypermetabolic nodule in the right lobe of thyroid gland. Hypermetabolic thyroid nodules on PET have up to 40-50% incidence of malignancy; recommend further evaluation with thyroid ultrasound and possible US-guided fine needle aspiration.   Electronically Signed  By: Kerby Moors M.D.  On: 07/18/2015 15:26      Result Notes     Notes Recorded by [name], MD on 07/19/2015 at 9:57 AM Discussed with patient by phone > Discussed in detail all the indications, usual risks and alternatives relative to the benefits with patient who agrees to proceed with bronchoscopy with biopsy. On 07/26/15          Vitals     Height Weight BMI (Calculated)    '5\' 9"'$  (1.753 m) 171 lb 3.2 oz (77.656 kg) 25.3      Interpretation Summary     CLINICAL DATA: Initial treatment strategy for Lung cancer.  EXAM: NUCLEAR MEDICINE PET SKULL BASE TO THIGH  TECHNIQUE: 8.45 mCi F-18 FDG was injected intravenously. Full-ring PET imaging was performed from the skull base to thigh after the radiotracer. CT data was obtained and used for attenuation correction and anatomic localization.  FASTING BLOOD GLUCOSE: Value: 9.6 mg/dl  COMPARISON: 06/23/2015  FINDINGS: NECK  Within the upper pole of the right lobe of thyroid gland there is a hypermetabolic nodule measuring 1.4 cm and has an SUV max equal to 5.74. No hypermetabolic cervical lymph nodes identified.  CHEST  Right paratracheal lymph node is normal measuring 8 mm but has  an SUV max equal to 4.19. Mild FDG uptake associated with sub carinal lymph node has an SUV max equal to 3.86. Low attenuation nodule within the left adrenal gland measures 1.5 cm and is unchanged from 7/ There is a endobronchial lesion within the left lower lobe bronchi measuring approximately 2 cm. This has an SUV max equal to 23.8. And is worrisome for primary bronchogenic carcinoma. Sub solid scratch sec the pure ground-glass attenuating nodule within the left upper lobe measures 2.8 cm and has an SUV max equal to 1.25.  ABDOMEN/PELVIS  No abnormal hypermetabolic activity within the liver, pancreas, adrenal glands, or spleen. There is a large hiatal hernia identified. Aortic atherosclerosis noted. The infrarenal abdominal aorta has a AP  diameter of 2.8 cm. No hypermetabolic lymph nodes in the abdomen or pelvis.  SKELETON  Hypermetabolic lesion involving the spinous process of the L3 vertebra is identified within SUV max equal to 5.7. Suspicious for bone metastasis.  IMPRESSION: 1. There is an endobronchial lesion within the left lower lobe airway which is concerning for primary bronchogenic carcinoma. There is a hypermetabolic lesion involving the spinous process of the L3 vertebra, suspicious for bone metastasis. 2. Nonspecific FDG uptake is identified within normal-sized right paratracheal and sub- carinal lymph nodes. 3. Persistent, suspicious pure ground-glass attenuating nodule within the left upper lobe exhibits low level FDG uptake. Note, PET-CT is of limited value in evaluating pure ground-glass nodules greater than 5 mm and is potentially misleading and therefore not recommended. However, given the interval increase in size this remains worrisome for pulmonary adenocarcinoma. 4. Hypermetabolic nodule in the right lobe of thyroid gland. Hypermetabolic thyroid nodules on PET have up to 40-50% incidence of malignancy; recommend further evaluation with thyroid  ultrasound and possible US-guided fine needle aspiration.   Electronically Signed  By: Kerby Moors M.D.  On: 07/18/2015 15:26   CLINICAL DATA: Left upper lobe pulmonary nodule. Tobacco abuse. Hyperlipidemia.  EXAM: CT CHEST WITHOUT CONTRAST  TECHNIQUE: Multidetector CT imaging of the chest was performed following the standard protocol without IV contrast.  COMPARISON: 10/09/2014, 01/21/2013  FINDINGS: Mediastinum/Nodes: Normal heart size. Stable ascending aortic ectasia measuring 3.9 cm. Coronary artery atherosclerosis in the left main, lad, first diagonal branch and RCA. No pericardial effusion. No mediastinal or hilar lymphadenopathy, but evaluation is limited on a noncontrast examination.  Lungs/Pleura: Stable left upper lobe ground-glass pulmonary nodule measuring 2.7 x 2.4 cm with no solid component. No other pulmonary nodules or pulmonary masses. No focal consolidation, pleural effusion or pneumothorax. Mild bronchial wall thickening. Mild centrilobular and paraseptal emphysema. Mild biapical scarring most consistent with prior infectious or inflammatory etiology.  Upper abdomen: Moderate-sized hiatal hernia. Stable hypodensities in the right and left hepatic lobe too small to characterize likely representing a small cyst unchanged compared with 10/09/2014.  Musculoskeletal: No aggressive lytic or sclerotic osseous lesion. No acute osseous abnormality.  Other: Mild retroareolar fibroglandular tissue bilaterally as can be seen with gynecomastia.  IMPRESSION: 1. 2.7 x 2.4 cm ground-glass left upper lobe pulmonary nodule without a solid component the size has significantly increased compared with 01/21/2013. The appearance is concerning for an indolent malignancy such as an adenocarcinoma. 2. Coronary artery atherosclerosis. 3. Bilateral mild gynecomastia.   Electronically Signed  By: Kathreen Devoid  On: 06/23/2015 09:49  I have  independently reviewed the above radiologic studies.  Needle Biopsy: Diagnosis Bone, biopsy - BENIGN SOFT TISSUE AND BLOOD, SEE COMMENT. - NEGATIVE FOR ATYPIA OR MALIGNANCY. Microscopic Comment The clinical impression of a spinal lesion is noted. Sections from the tissue submitted demonstrate predominantly blood in which there are fragments of benign muscle, adipose tissue and fibro-cartilaginous tissue. There are no definitive features of malignancy present. The case was reviewed with Dr. Tresa Moore who concurs. (CRR:ds 08/15/15) Mali RUND DO Pathologist, Electronic Signature (Case signed 08/15/2015)  Diagnosis Endobronchial biopsy, LLL - POSITIVE FOR SQUAMOUS CELL CARCINOMA. Microscopic Comment The diagnosis is called to Dr. Melvyn Novas on 07/27/15. Dr. Lyndon Code has seen this case in consultation with agreement. The tumor can be sent for molecular testing upon request. (RAH:gt, 07/27/15) Willeen Niece MD Pathologist, Electronic Signature   Bronchoscopy done : 07/26/2015 Noted: friable mass obst LLL at segmental level, o/w nl airways    Recent Lab Findings: Lab  Results  Component Value Date   WBC 10.2 08/17/2015   HGB 14.8 08/17/2015   HCT 44.4 08/17/2015   PLT 276 08/17/2015   GLUCOSE 83 08/17/2015   CHOL 145 06/19/2015   TRIG 180.0* 06/19/2015   HDL 46.90 06/19/2015   LDLDIRECT 142.2 12/26/2008   LDLCALC 62 06/19/2015   ALT 14 08/17/2015   AST 13 08/17/2015   NA 140 08/17/2015   K 4.8 08/17/2015   CL 101 09/27/2014   CREATININE 0.8 08/17/2015   BUN 16.3 08/17/2015   CO2 28 08/17/2015   TSH 1.55 12/26/2008   INR 1.10 08/14/2015   Aortic Size Index=   4.0      /Body surface area is 1.94 meters squared. 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  Abnormal right lobe of thyroid.  Plan , full pft's, ultrasound of thyroid and biopsy if needed after [place]. Patient previous CT has been deleted so will repeat ct superd and plan to proceed with bronchoscopy, ebus to bx #7 nodes  And navigation to left lung lesion. If patient has cancer in upper and lower left lobe or in #7 nodes will influnce decision about resection. At bronchoscopy will elevate closeness of left loer lower lung lesion     I  spent 40 minutes counseling the patient face to face and 50% or more the  time was spent in counseling and coordination of care. The total time spent in the appointment was 60 minutes.  Grace Isaac MD      [address] [place] [address] Office [phone]   Beeper [phone]  08/22/2015 2:31 PM

## 2015-08-22 NOTE — Telephone Encounter (Signed)
Oncology Nurse Navigator Documentation  Oncology Nurse Navigator Flowsheets 08/22/2015  Navigator Encounter Type Telephone/Called to follow up with Robert Bates.  I reminded him of his appt with Dr. Servando Snare today.  He stated he was getting ready to leave for appt.   I also asked him about his smoking cessation.  He stated he quit.  He has not smoked since Thursday.  I encouraged his efforts.    Patient Visit Type Follow-up  Treatment Phase Abnormal Scans  Barriers/Navigation Needs Education  Education Smoking cessation  Interventions Education Method  Coordination of Care -  Time Spent with Patient 30

## 2015-08-23 ENCOUNTER — Ambulatory Visit (HOSPITAL_COMMUNITY)
Admission: RE | Admit: 2015-08-23 | Discharge: 2015-08-23 | Disposition: A | Discharge status: Home / Self Care | Payer: Medicare Other | Source: Ambulatory Visit | Attending: Cardiothoracic Surgery | Admitting: Cardiothoracic Surgery

## 2015-08-23 ENCOUNTER — Encounter (HOSPITAL_COMMUNITY)
Admission: RE | Disposition: A | Discharge status: Home / Self Care | Payer: Self-pay | Source: Ambulatory Visit | Attending: Cardiothoracic Surgery

## 2015-08-23 ENCOUNTER — Ambulatory Visit (HOSPITAL_COMMUNITY): Payer: Medicare Other

## 2015-08-23 ENCOUNTER — Encounter (HOSPITAL_COMMUNITY): Payer: Self-pay

## 2015-08-23 ENCOUNTER — Ambulatory Visit (HOSPITAL_COMMUNITY): Payer: Medicare Other | Admitting: Anesthesiology

## 2015-08-23 DIAGNOSIS — I739 Peripheral vascular disease, unspecified: Secondary | ICD-10-CM | POA: Insufficient documentation

## 2015-08-23 DIAGNOSIS — K219 Gastro-esophageal reflux disease without esophagitis: Secondary | ICD-10-CM | POA: Insufficient documentation

## 2015-08-23 DIAGNOSIS — C3432 Malignant neoplasm of lower lobe, left bronchus or lung: Secondary | ICD-10-CM | POA: Diagnosis present

## 2015-08-23 DIAGNOSIS — K7689 Other specified diseases of liver: Secondary | ICD-10-CM | POA: Diagnosis not present

## 2015-08-23 DIAGNOSIS — K449 Diaphragmatic hernia without obstruction or gangrene: Secondary | ICD-10-CM | POA: Diagnosis not present

## 2015-08-23 DIAGNOSIS — F17210 Nicotine dependence, cigarettes, uncomplicated: Secondary | ICD-10-CM | POA: Diagnosis not present

## 2015-08-23 DIAGNOSIS — Z7982 Long term (current) use of aspirin: Secondary | ICD-10-CM | POA: Diagnosis not present

## 2015-08-23 DIAGNOSIS — F1020 Alcohol dependence, uncomplicated: Secondary | ICD-10-CM | POA: Diagnosis not present

## 2015-08-23 DIAGNOSIS — I2510 Atherosclerotic heart disease of native coronary artery without angina pectoris: Secondary | ICD-10-CM | POA: Insufficient documentation

## 2015-08-23 DIAGNOSIS — N62 Hypertrophy of breast: Secondary | ICD-10-CM | POA: Diagnosis not present

## 2015-08-23 DIAGNOSIS — Z87442 Personal history of urinary calculi: Secondary | ICD-10-CM | POA: Insufficient documentation

## 2015-08-23 DIAGNOSIS — J9811 Atelectasis: Secondary | ICD-10-CM | POA: Insufficient documentation

## 2015-08-23 DIAGNOSIS — D3502 Benign neoplasm of left adrenal gland: Secondary | ICD-10-CM | POA: Insufficient documentation

## 2015-08-23 DIAGNOSIS — F419 Anxiety disorder, unspecified: Secondary | ICD-10-CM | POA: Insufficient documentation

## 2015-08-23 DIAGNOSIS — E785 Hyperlipidemia, unspecified: Secondary | ICD-10-CM | POA: Diagnosis not present

## 2015-08-23 DIAGNOSIS — C3482 Malignant neoplasm of overlapping sites of left bronchus and lung: Secondary | ICD-10-CM | POA: Diagnosis not present

## 2015-08-23 DIAGNOSIS — Z419 Encounter for procedure for purposes other than remedying health state, unspecified: Secondary | ICD-10-CM

## 2015-08-23 DIAGNOSIS — R911 Solitary pulmonary nodule: Secondary | ICD-10-CM

## 2015-08-23 DIAGNOSIS — Z91041 Radiographic dye allergy status: Secondary | ICD-10-CM | POA: Insufficient documentation

## 2015-08-23 DIAGNOSIS — I700 Atherosclerosis of aorta: Secondary | ICD-10-CM | POA: Insufficient documentation

## 2015-08-23 HISTORY — DX: Personal history of other diseases of the digestive system: Z87.19

## 2015-08-23 HISTORY — DX: Pneumonia, unspecified organism: J18.9

## 2015-08-23 HISTORY — PX: VIDEO BRONCHOSCOPY WITH ENDOBRONCHIAL NAVIGATION: SHX6175

## 2015-08-23 HISTORY — DX: Peripheral vascular disease, unspecified: I73.9

## 2015-08-23 HISTORY — DX: Unspecified osteoarthritis, unspecified site: M19.90

## 2015-08-23 HISTORY — DX: Anxiety disorder, unspecified: F41.9

## 2015-08-23 HISTORY — DX: Malignant (primary) neoplasm, unspecified: C80.1

## 2015-08-23 HISTORY — PX: VIDEO BRONCHOSCOPY WITH ENDOBRONCHIAL ULTRASOUND: SHX6177

## 2015-08-23 LAB — APTT: aPTT: 32 seconds (ref 24–37)

## 2015-08-23 LAB — PROTIME-INR
INR: 1 (ref 0.00–1.49)
Prothrombin Time: 13.4 seconds (ref 11.6–15.2)

## 2015-08-23 SURGERY — BRONCHOSCOPY, WITH EBUS
Anesthesia: General

## 2015-08-23 MED ORDER — OXYCODONE HCL 5 MG/5ML PO SOLN
5.0000 mg | Freq: Once | ORAL | Status: AC | PRN
  Administered 2015-08-23: 5 mg via ORAL

## 2015-08-23 MED ORDER — OXYCODONE HCL 5 MG PO TABS: 5.0000 mg | ORAL_TABLET | Freq: Once | ORAL | Status: AC | PRN

## 2015-08-23 MED ORDER — OXYCODONE HCL 5 MG/5ML PO SOLN
ORAL | Status: AC
  Filled 2015-08-23: qty 5

## 2015-08-23 MED ORDER — ONDANSETRON HCL 4 MG/2ML IJ SOLN: 4.0000 mg | Freq: Once | INTRAMUSCULAR | Status: DC | PRN

## 2015-08-23 MED ORDER — LACTATED RINGERS IV SOLN
INTRAVENOUS | Status: DC | PRN
  Administered 2015-08-23 (×2): via INTRAVENOUS

## 2015-08-23 MED ORDER — ONDANSETRON HCL 4 MG/2ML IJ SOLN
INTRAMUSCULAR | Status: DC | PRN
  Administered 2015-08-23: 4 mg via INTRAVENOUS

## 2015-08-23 MED ORDER — ONDANSETRON HCL 4 MG/2ML IJ SOLN
INTRAMUSCULAR | Status: AC
  Filled 2015-08-23: qty 2

## 2015-08-23 MED ORDER — EPINEPHRINE HCL 1 MG/ML IJ SOLN
INTRAMUSCULAR | Status: AC
  Filled 2015-08-23: qty 1

## 2015-08-23 MED ORDER — DEXAMETHASONE SODIUM PHOSPHATE 4 MG/ML IJ SOLN
INTRAMUSCULAR | Status: DC | PRN
  Administered 2015-08-23: 4 mg via INTRAVENOUS

## 2015-08-23 MED ORDER — FENTANYL CITRATE (PF) 100 MCG/2ML IJ SOLN
INTRAMUSCULAR | Status: DC | PRN
  Administered 2015-08-23: 50 ug via INTRAVENOUS
  Administered 2015-08-23 (×2): 100 ug via INTRAVENOUS

## 2015-08-23 MED ORDER — ARTIFICIAL TEARS OP OINT
TOPICAL_OINTMENT | OPHTHALMIC | Status: DC | PRN
  Administered 2015-08-23: 1 via OPHTHALMIC

## 2015-08-23 MED ORDER — GLYCOPYRROLATE 0.2 MG/ML IJ SOLN
INTRAMUSCULAR | Status: DC | PRN
  Administered 2015-08-23: 0.6 mg via INTRAVENOUS

## 2015-08-23 MED ORDER — FENTANYL CITRATE (PF) 100 MCG/2ML IJ SOLN
25.0000 ug | INTRAMUSCULAR | Status: DC | PRN
  Administered 2015-08-23 (×2): 50 ug via INTRAVENOUS

## 2015-08-23 MED ORDER — PROPOFOL 10 MG/ML IV BOLUS
INTRAVENOUS | Status: DC | PRN
  Administered 2015-08-23: 180 mg via INTRAVENOUS

## 2015-08-23 MED ORDER — LIDOCAINE HCL 4 % MT SOLN
OROMUCOSAL | Status: DC | PRN
  Administered 2015-08-23: 4 mL via TOPICAL

## 2015-08-23 MED ORDER — SODIUM CHLORIDE 0.9 % IV SOLN
10.0000 mg | INTRAVENOUS | Status: DC | PRN
  Administered 2015-08-23: 10 ug/min via INTRAVENOUS

## 2015-08-23 MED ORDER — LACTATED RINGERS IV SOLN
INTRAVENOUS | Status: DC
  Administered 2015-08-23: 11:00:00 via INTRAVENOUS

## 2015-08-23 MED ORDER — LIDOCAINE HCL (CARDIAC) 20 MG/ML IV SOLN
INTRAVENOUS | Status: DC | PRN
  Administered 2015-08-23: 80 mg via INTRAVENOUS

## 2015-08-23 MED ORDER — ARTIFICIAL TEARS OP OINT
TOPICAL_OINTMENT | OPHTHALMIC | Status: AC
  Filled 2015-08-23: qty 3.5

## 2015-08-23 MED ORDER — DEXAMETHASONE SODIUM PHOSPHATE 4 MG/ML IJ SOLN
INTRAMUSCULAR | Status: AC
  Filled 2015-08-23: qty 1

## 2015-08-23 MED ORDER — NEOSTIGMINE METHYLSULFATE 10 MG/10ML IV SOLN
INTRAVENOUS | Status: DC | PRN
  Administered 2015-08-23: 5 mg via INTRAVENOUS

## 2015-08-23 MED ORDER — FENTANYL CITRATE (PF) 100 MCG/2ML IJ SOLN
INTRAMUSCULAR | Status: AC
  Filled 2015-08-23: qty 2

## 2015-08-23 MED ORDER — LIDOCAINE HCL (CARDIAC) 20 MG/ML IV SOLN
INTRAVENOUS | Status: AC
  Filled 2015-08-23: qty 5

## 2015-08-23 MED ORDER — ROCURONIUM BROMIDE 50 MG/5ML IV SOLN
INTRAVENOUS | Status: AC
  Filled 2015-08-23: qty 1

## 2015-08-23 MED ORDER — ROCURONIUM BROMIDE 100 MG/10ML IV SOLN
INTRAVENOUS | Status: DC | PRN
  Administered 2015-08-23: 10 mg via INTRAVENOUS
  Administered 2015-08-23: 40 mg via INTRAVENOUS
  Administered 2015-08-23: 10 mg via INTRAVENOUS

## 2015-08-23 MED ORDER — FENTANYL CITRATE (PF) 250 MCG/5ML IJ SOLN
INTRAMUSCULAR | Status: AC
  Filled 2015-08-23: qty 5

## 2015-08-23 MED ORDER — LIDOCAINE HCL (CARDIAC) 20 MG/ML IV SOLN
INTRAVENOUS | Status: AC
  Filled 2015-08-23: qty 10

## 2015-08-23 MED ORDER — 0.9 % SODIUM CHLORIDE (POUR BTL) OPTIME
TOPICAL | Status: DC | PRN
  Administered 2015-08-23: 1000 mL

## 2015-08-23 SURGICAL SUPPLY — 36 items
BRUSH BIOPSY BRONCH 10 SDTNB (MISCELLANEOUS) ×2 IMPLANT
BRUSH CYTOL CELLEBRITY 1.5X140 (MISCELLANEOUS) IMPLANT
BRUSH SUPERTRAX BIOPSY (INSTRUMENTS) IMPLANT
BRUSH SUPERTRAX NDL-TIP CYTO (INSTRUMENTS) IMPLANT
CANISTER SUCTION 2500CC (MISCELLANEOUS) ×4 IMPLANT
CHANNEL WORK EXTEND EDGE 180 (KITS) IMPLANT
CHANNEL WORK EXTEND EDGE 45 (KITS) IMPLANT
CHANNEL WORK EXTEND EDGE 90 (KITS) IMPLANT
CONT SPEC 4OZ CLIKSEAL STRL BL (MISCELLANEOUS) ×6 IMPLANT
COVER DOME SNAP 22 D (MISCELLANEOUS) ×2 IMPLANT
COVER TABLE BACK 60X90 (DRAPES) ×4 IMPLANT
DRSG AQUACEL AG ADV 3.5X14 (GAUZE/BANDAGES/DRESSINGS) ×2 IMPLANT
FILTER STRAW FLUID ASPIR (MISCELLANEOUS) IMPLANT
FORCEPS BIOP RJ4 1.8 (CUTTING FORCEPS) IMPLANT
FORCEPS BIOP SUPERTRX PREMAR (INSTRUMENTS) IMPLANT
GAUZE SPONGE 4X4 12PLY STRL (GAUZE/BANDAGES/DRESSINGS) ×4 IMPLANT
GLOVE BIO SURGEON STRL SZ 6.5 (GLOVE) ×4 IMPLANT
KIT CLEAN ENDO COMPLIANCE (KITS) ×10 IMPLANT
KIT PROCEDURE EDGE 180 (KITS) IMPLANT
KIT PROCEDURE EDGE 45 (KITS) IMPLANT
KIT PROCEDURE EDGE 90 (KITS) ×2 IMPLANT
KIT ROOM TURNOVER OR (KITS) ×4 IMPLANT
MARKER SKIN DUAL TIP RULER LAB (MISCELLANEOUS) ×4 IMPLANT
NEEDLE BIOPSY TRANSBRONCH 21G (NEEDLE) IMPLANT
NEEDLE BLUNT 18X1 FOR OR ONLY (NEEDLE) IMPLANT
NEEDLE SONO TIP II EBUS (NEEDLE) ×2 IMPLANT
NEEDLE SUPERTRX PREMARK BIOPSY (NEEDLE) IMPLANT
NS IRRIG 1000ML POUR BTL (IV SOLUTION) ×4 IMPLANT
OIL SILICONE PENTAX (PARTS (SERVICE/REPAIRS)) ×4 IMPLANT
PAD ARMBOARD 7.5X6 YLW CONV (MISCELLANEOUS) ×8 IMPLANT
PATCHES PATIENT (LABEL) ×6 IMPLANT
SYR 20CC LL (SYRINGE) ×2 IMPLANT
SYR 20ML ECCENTRIC (SYRINGE) ×4 IMPLANT
TOWEL OR 17X24 6PK STRL BLUE (TOWEL DISPOSABLE) ×4 IMPLANT
TRAP SPECIMEN MUCOUS 40CC (MISCELLANEOUS) ×4 IMPLANT
TUBE CONNECTING 20X1/4 (TUBING) ×4 IMPLANT

## 2015-08-23 NOTE — Anesthesia Preprocedure Evaluation (Signed)
Anesthesia Evaluation  Patient identified by MRN, date of birth, ID band Patient awake    Reviewed: Allergy & Precautions, NPO status , Patient's Chart, lab work & pertinent test results  Airway Mallampati: II  TM Distance: >3 FB Neck ROM: Full    Dental  (+) Edentulous Upper, Edentulous Lower   Pulmonary former smoker,    + rhonchi        Cardiovascular  Rhythm:Regular Rate:Normal     Neuro/Psych    GI/Hepatic   Endo/Other    Renal/GU      Musculoskeletal   Abdominal   Peds  Hematology   Anesthesia Other Findings   Reproductive/Obstetrics                             Anesthesia Physical Anesthesia Plan  ASA: III  Anesthesia Plan: General   Post-op Pain Management:    Induction: Intravenous  Airway Management Planned: Oral ETT  Additional Equipment:   Intra-op Plan:   Post-operative Plan: Extubation in OR  Informed Consent: I have reviewed the patients History and Physical, chart, labs and discussed the procedure including the risks, benefits and alternatives for the proposed anesthesia with the patient or authorized representative who has indicated his/her understanding and acceptance.     Plan Discussed with: CRNA and Anesthesiologist  Anesthesia Plan Comments:         Anesthesia Quick Evaluation

## 2015-08-23 NOTE — H&P (Signed)
[address]       [place] [address]             [phone]                    Robert Bates Medical Record #[id number] Date of Birth: 02/26/1941  Referring: No ref. provider found Primary Care: Nance Pear., NP  Chief Complaint:    No chief complaint on file.  Stage IIB (T3, N0, M0) non-small cell lung cancer, squamous cell carcinoma presented with left lower lobe endobronchial lesion as well as suspicious groundglass opacity in the left upper lobe diagnosed in September 2016.  History of Present Illness:    Robert Bates 74 y.o. male is seen in the office  today for evaluation of left lower non small cell lung cancer bx by bronchoscopy. In addition he has a enlarging  Ground glass mass in the left upper lobe present for at least 2 years. Patient had needle biopsy of hypermetabolic spine lesion , was negative for mets. Patien tis lower term smoker smoking since age 63 more then pack per day.        Current Activity/ Functional Status:  Patient is independent with mobility/ambulation, transfers, ADL's, IADL's.   Zubrod Score: At the time of surgery this patient's most appropriate activity status/level should be described as: '[]'$     0    Normal activity, no symptoms '[x]'$     1    Restricted in physical strenuous activity but ambulatory, able to do out light work '[]'$     2    Ambulatory and capable of self care, unable to do work activities, up and about               >50 % of waking hours                              '[]'$     3    Only limited self care, in bed greater than 50% of waking hours '[]'$     4    Completely disabled, no self care, confined to bed or chair '[]'$     5    Moribund   Past Medical History  Diagnosis Date  . GERD (gastroesophageal reflux disease)   . Alcoholism ([place])     past problem per pt but admits to still drinking - unclear how much  . Tobacco abuse   . Hematuria     refuses work up or referral - understands risks  of morbidity / mortality - 11/2008, 12/2008  . Hyperlipemia   . Kidney stones   . Meningioma ([place]) 10/25/2013    Follows with Dr. [name].   . Peripheral vascular disease ([place])     Abdominal Aortic Aneursym  . Pneumonia     as a child  . Anxiety   . History of hiatal hernia   . Kidney cyst, acquired   . Arthritis   . Cancer ([place]) 2016    lung    Past Surgical History  Procedure Laterality Date  . Tonsillectomy    . Hernia repair    . Video bronchoscopy Bilateral 07/26/2015    Procedure: VIDEO BRONCHOSCOPY WITH FLUORO;  Surgeon: [name], MD;  Location: [facility];  Service: Cardiopulmonary;  Laterality: Bilateral;  . Eye surgery Bilateral     Cataracts removed w/ lens implant  . Colonoscopy  Family History  Problem Relation Age of Onset  . Leukemia Father   . Learning disabilities Son   . Leukemia Other   . Stroke Other   . Emphysema Father     Social History   Social History  . Marital Status: Married    Spouse Name: N/A  . Number of Children: 2  . Years of Education: N/A   Occupational History  . Retired     Administrator  .     Social History Main Topics  . Smoking status: Former Smoker -- 1.00 packs/day for 62 years    Quit date: 08/08/2015  . Smokeless tobacco: Former Systems developer    Types: Chew    Quit date: 11/04/1958     Comment: Started smoking at age 21.  Currently smoking 1 ppd.  . Alcohol Use: 0.0 oz/week    0 Standard drinks or equivalent per week     Comment: Pt reports rare use  . Drug Use: No  . Sexual Activity: Not on file   Other Topics Concern  . Not on file   Social History Narrative    History  Smoking status  . Former Smoker -- 1.00 packs/day for 57 years  . Quit date: 08/08/2015  Smokeless tobacco  . Former Systems developer  . Types: Chew  . Quit date: 11/04/1958    Comment: Started smoking at age 54.  Currently smoking 1 ppd.    History  Alcohol Use  . 0.0 oz/week  . 0 Standard drinks or equivalent per week     Comment: Pt reports rare use     Allergies  Allergen Reactions  . Iodine     REACTION: neck swells    Current Facility-Administered Medications  Medication Dose Route Frequency Provider Last Rate Last Dose  . lactated ringers infusion   Intravenous Continuous Grace Isaac, MD 50 mL/hr at 08/23/15 1124        Review of Systems:     Cardiac Review of Systems: Y or N  Chest Pain [  n  ]  Resting SOB [ n  ] Exertional SOB  Blue.Reese  ]  Orthopnea [ n ]   Pedal Edema [ n  ]    Palpitations [ n ] Syncope  [  n]   Presyncope [ n  ]  General Review of Systems: [Y] = yes [  ]=no Constitional: recent weight change [n  ];  Wt loss over the last 3 months [   ] anorexia [  ]; fatigue [  ]; nausea [  ]; night sweats [ n ]; fever [ n ]; or chills [  ];          Dental: poor dentition[  ]; Last Dentist visit:   Eye : blurred vision [  ]; diplopia [   ]; vision changes [  ];  Amaurosis fugax[  ]; Resp: cough [  ];  wheezing[ y ];  hemoptysis[n  ]; shortness of breath[n  ]; paroxysmal nocturnal dyspnea[  ]; dyspnea on exertion[  ]; or orthopnea[  ];  GI:  gallstones[  ], vomiting[  n];  dysphagia[  ]; melena[  ];  hematochezia [  ]; heartburn[  ];   Hx of  Colonoscopy[  ]; GU: kidney stones [  ]; hematuria[  ];   dysuria [  ];  nocturia[  ];  history of     obstruction [  ]; urinary frequency [  ]  Skin: rash, swelling[  ];, hair loss[  ];  peripheral edema[  ];  or itching[  ]; Musculosketetal: myalgias[  ];  joint swelling[  ];  joint erythema[  ];  joint pain[  ];  back pain[  ];  Heme/Lymph: bruising[  ];  bleeding[  ];  anemia[  ];  Neuro: TIA[  ];  headaches[  ];  stroke[  ];  vertigo[  ];  seizures[  ];   paresthesias[  ];  difficulty walking[  ];  Psych:depression[  ]; anxiety[  ];  Endocrine: diabetes[ n ];  thyroid dysfunction[ n ];  Immunizations: Flu up to date [  ]; Pneumococcal up to date [  ];  Other:  Physical Exam: BP 135/89 mmHg  Pulse 58  Temp(Src) 97.5 F (36.4 C)  (Oral)  Resp 18  Ht '5\' 9"'$  (1.753 m)  Wt 170 lb (77.111 kg)  BMI 25.09 kg/m2  SpO2 100%  PHYSICAL EXAMINATION: General appearance: alert, cooperative and no distress Head: Normocephalic, without obvious abnormality, atraumatic Neck: no adenopathy, no carotid bruit, no JVD, supple, symmetrical, trachea midline and thyroid not enlarged, symmetric, no tenderness/mass/nodules Lymph nodes: Cervical, supraclavicular, and axillary nodes normal. Resp: diminished breath sounds bilaterally Back: symmetric, no curvature. ROM normal. No CVA tenderness. Cardio: regular rate and rhythm, S1, S2 normal, no murmur, click, rub or gallop GI: soft, non-tender; bowel sounds normal; no masses,  no organomegaly Extremities: extremities normal, atraumatic, no cyanosis or edema and Homans sign is negative, no sign of DVT Neurologic: Grossly normal  Diagnostic Studies & Laboratory data:     Recent Radiology Findings:    Dg Chest 2 View  08/23/2015  CLINICAL DATA:  Left lung lesion.  Preop examination. EXAM: CHEST  2 VIEW COMPARISON:  Chest CT 08/23/2015 FINDINGS: The ground-glass opacity in the left upper lobe or the endobronchial lesion in the left lower lobe cannot be visualized by plain film. Minimal atelectasis in the left lung base. Right lung is clear. Heart is normal size. No effusions. No acute bony abnormality. IMPRESSION: Left base atelectasis. Electronically Signed   By: Rolm Baptise M.D.   On: 08/23/2015 12:14   Mr Jeri Cos YJ Contrast  08/15/2015  CLINICAL DATA:  Recently diagnosed left lower lobe lung cancer. Staging. EXAM: MRI HEAD WITHOUT AND WITH CONTRAST TECHNIQUE: Multiplanar, multiecho pulse sequences of the brain and surrounding structures were obtained without and with intravenous contrast. CONTRAST:  71m MULTIHANCE GADOBENATE DIMEGLUMINE 529 MG/ML IV SOLN COMPARISON:  08/14/2013 and 07/31/2013 FINDINGS: Some sequences are mildly motion degraded. There is mild generalized cerebral atrophy. No  acute infarct, intracranial hemorrhage, intra-axial mass, midline shift, or extra-axial fluid collection is identified. Small foci of T2 hyperintensity in the subcortical and deep cerebral white matter are nonspecific but compatible with minimal chronic small vessel ischemic disease, less than is often seen in patients of this age. Homogeneously enhancing extra-axial mass with dural tail along the planum sphenoidale has not significantly changed in size, measuring 1.6 x 1.4 x 1.0 cm (previously 1.7 x 1.6 x 0.9 cm). This mass again extends slightly into the anterior aspect of the sella. The prechiasmatic right optic nerve is again noted to be in close proximity to this mass. Extra-axial homogeneously enhancing mass at the right frontal skull vertex is also unchanged in size, measuring 3.2 x 2.6 x 2.1 cm. There is mild mass effect on the underlying right frontal lobe with very mild brain edema, increased from prior. There is no evidence of superior sagittal sinus  invasion. No new enhancing lesions are identified. Prior bilateral cataract extraction is noted. There is chronic right maxillary sinusitis with moderate circumferential mucosal thickening as well as central fluid. A large mucous retention cyst and small amount of fluid are present in the left maxillary sinus. There is also mucosal thickening in subtotal opacification of the right frontal sinus and mild mucosal thickening in the left frontal sinus, and there is opacification of multiple anterior ethmoid air cells bilaterally. Mild left sphenoid sinus mucosal thickening is noted. No significant mastoid effusion is seen. Major intracranial vascular flow voids are preserved, with the left vertebral artery being dominant. IMPRESSION: 1. No evidence of intracranial metastases. 2. Unchanged right frontal vertex meningioma. Increased, very mild edema in the adjacent frontal lobe. 3. Unchanged planum sphenoidale meningioma. 4. Acute on chronic sinusitis.  Electronically Signed   By: Logan Bores M.D.   On: 08/15/2015 21:14   Ct Biopsy  08/14/2015  CLINICAL DATA:  Hypermetabolic left hilar lesion. Focus of hypermetabolic activity at the margin of the lumbar L3 vertebral spinous process. EXAM: CT GUIDED CORE BONE BIOPSY OF LUMBAR L3 SPINOUS PROCESS LESION ANESTHESIA/SEDATION: Intravenous Fentanyl and Versed were administered as conscious sedation during continuous cardiorespiratory monitoring by the radiology RN, with a total moderate sedation time of 12 minutes. PROCEDURE: The procedure risks, benefits, and alternatives were explained to the patient. Questions regarding the procedure were encouraged and answered. The patient understands and consents to the procedure. Patient placed prone. Select axial scans through the lumbar spine obtained. The lesion was localized and an appropriate skin entry site was determined and marked. The operative field was prepped with chlorhexidinein a sterile fashion, and a sterile drape was applied covering the operative field. A sterile gown and sterile gloves were used for the procedure. Local anesthesia was provided with 1% Lidocaine. Under CT fluoroscopic guidance, a 11 gauge cook osteosite bone needle was advanced to the margin of the lesion. Once needle tip position was confirmed, multiple core biopsy samples were obtained, submitted in formalin to surgical pathology. The needle was removed. Postprocedure scans reveal no hematoma or other apparent complication. The patient tolerated the procedure well. COMPLICATIONS: None immediate FINDINGS: The bone abnormality corresponding to the site hypermetabolic activity on PET-CT was localized. Multiple core samples obtained of this region under CT fluoroscopic guidance as above. IMPRESSION: 1. Technically successful core bone biopsy of L3 spinous process lesion under CT guidance. Electronically Signed   By: Lucrezia Europe M.D.   On: 08/14/2015 15:00   Ct Super D Chest Wo  Contrast  08/23/2015  CLINICAL DATA:  Left upper lobe lesion. Pre-op for bronchoscopy/surgery planning. Subsequent encounter. EXAM: CT CHEST WITHOUT CONTRAST TECHNIQUE: Multidetector CT imaging of the chest was performed using thin slice collimation for electromagnetic bronchoscopy planning purposes, without intravenous contrast. COMPARISON:  PET-CT 07/18/2015 and chest CT 06/23/2015. FINDINGS: Mediastinum/Nodes: There are no enlarged mediastinal, hilar or axillary lymph nodes.The mildly hypermetabolic right paratracheal node is unchanged, measuring 8 mm short axis on image 23. Nodularity superiorly in the right thyroid lobe appears unchanged. There is a moderate size hiatal hernia. The heart size is normal. There is no pericardial effusion. Diffuse atherosclerosis of the aorta, great vessels and coronary arteries noted. Lungs/Pleura: There is no pleural effusion. Again demonstrated is an endobronchial lesion within the anteromedial segment of the left lower lobe, best seen on the reformatted images. This measures up to 2.5 cm on sagittal image number 76. There is minimal posterior obstructive atelectasis in the anteromedial left lower lobe.  The focal ground-glass opacity in the left upper lobe is not significantly changed, measuring approximately 3.1 x 2.9 cm. This demonstrates no progressive solid components. No other suspicious pulmonary findings. Scattered tiny right upper lobe nodules are stable. There is mild emphysema. Upper abdomen: No suspicious findings are demonstrated within the visualized upper abdomen. Small hepatic cysts and a left adrenal adenoma are unchanged. Musculoskeletal/Chest wall: There is no chest wall mass or suspicious osseous finding. Mild bilateral gynecomastia noted. IMPRESSION: 1. Stable endobronchial lesion within the anteromedial segment of the left lower lobe with associated mild postobstructive atelectasis. This was hypermetabolic on PET-CT, concerning for squamous cell carcinoma  or neuroendocrine tumor. 2. Stable left upper lobe ground-glass opacity. 3. Stable normal size but mildly hypermetabolic right paratracheal lymph node. Electronically Signed   By: Richardean Sale M.D.   On: 08/23/2015 11:00  PET:  CLINICAL DATA: Initial treatment strategy for Lung cancer.  EXAM: NUCLEAR MEDICINE PET SKULL BASE TO THIGH  TECHNIQUE: 8.45 mCi F-18 FDG was injected intravenously. Full-ring PET imaging was performed from the skull base to thigh after the radiotracer. CT data was obtained and used for attenuation correction and anatomic localization.  FASTING BLOOD GLUCOSE: Value: 9.6 mg/dl  COMPARISON: 06/23/2015  FINDINGS: NECK  Within the upper pole of the right lobe of thyroid gland there is a hypermetabolic nodule measuring 1.4 cm and has an SUV max equal to 5.74. No hypermetabolic cervical lymph nodes identified.  CHEST  Right paratracheal lymph node is normal measuring 8 mm but has an SUV max equal to 4.19. Mild FDG uptake associated with sub carinal lymph node has an SUV max equal to 3.86. Low attenuation nodule within the left adrenal gland measures 1.5 cm and is unchanged from 7/ There is a endobronchial lesion within the left lower lobe bronchi measuring approximately 2 cm. This has an SUV max equal to 23.8. And is worrisome for primary bronchogenic carcinoma. Sub solid scratch sec the pure ground-glass attenuating nodule within the left upper lobe measures 2.8 cm and has an SUV max equal to 1.25.  ABDOMEN/PELVIS  No abnormal hypermetabolic activity within the liver, pancreas, adrenal glands, or spleen. There is a large hiatal hernia identified. Aortic atherosclerosis noted. The infrarenal abdominal aorta has a AP diameter of 2.8 cm. No hypermetabolic lymph nodes in the abdomen or pelvis.  SKELETON  Hypermetabolic lesion involving the spinous process of the L3 vertebra is identified within SUV max equal to 5.7. Suspicious  for bone metastasis.  IMPRESSION: 1. There is an endobronchial lesion within the left lower lobe airway which is concerning for primary bronchogenic carcinoma. There is a hypermetabolic lesion involving the spinous process of the L3 vertebra, suspicious for bone metastasis. 2. Nonspecific FDG uptake is identified within normal-sized right paratracheal and sub- carinal lymph nodes. 3. Persistent, suspicious pure ground-glass attenuating nodule within the left upper lobe exhibits low level FDG uptake. Note, PET-CT is of limited value in evaluating pure ground-glass nodules greater than 5 mm and is potentially misleading and therefore not recommended. However, given the interval increase in size this remains worrisome for pulmonary adenocarcinoma. 4. Hypermetabolic nodule in the right lobe of thyroid gland. Hypermetabolic thyroid nodules on PET have up to 40-50% incidence of malignancy; recommend further evaluation with thyroid ultrasound and possible US-guided fine needle aspiration.   Electronically Signed  By: Kerby Moors M.D.  On: 07/18/2015 15:26      Result Notes     Notes Recorded by [name], MD on 07/19/2015 at  9:1 AM Discussed with patient by phone > Discussed in detail all the indications, usual risks and alternatives relative to the benefits with patient who agrees to proceed with bronchoscopy with biopsy. On 07/26/15          Vitals     Height Weight BMI (Calculated)    '5\' 9"'$  (1.753 m) 171 lb 3.2 oz (77.656 kg) 25.3      Interpretation Summary     CLINICAL DATA: Initial treatment strategy for Lung cancer.  EXAM: NUCLEAR MEDICINE PET SKULL BASE TO THIGH  TECHNIQUE: 8.45 mCi F-18 FDG was injected intravenously. Full-ring PET imaging was performed from the skull base to thigh after the radiotracer. CT data was obtained and used for attenuation correction and anatomic localization.  FASTING BLOOD GLUCOSE: Value: 9.6  mg/dl  COMPARISON: 06/23/2015  FINDINGS: NECK  Within the upper pole of the right lobe of thyroid gland there is a hypermetabolic nodule measuring 1.4 cm and has an SUV max equal to 5.74. No hypermetabolic cervical lymph nodes identified.  CHEST  Right paratracheal lymph node is normal measuring 8 mm but has an SUV max equal to 4.19. Mild FDG uptake associated with sub carinal lymph node has an SUV max equal to 3.86. Low attenuation nodule within the left adrenal gland measures 1.5 cm and is unchanged from 7/ There is a endobronchial lesion within the left lower lobe bronchi measuring approximately 2 cm. This has an SUV max equal to 23.8. And is worrisome for primary bronchogenic carcinoma. Sub solid scratch sec the pure ground-glass attenuating nodule within the left upper lobe measures 2.8 cm and has an SUV max equal to 1.25.  ABDOMEN/PELVIS  No abnormal hypermetabolic activity within the liver, pancreas, adrenal glands, or spleen. There is a large hiatal hernia identified. Aortic atherosclerosis noted. The infrarenal abdominal aorta has a AP diameter of 2.8 cm. No hypermetabolic lymph nodes in the abdomen or pelvis.  SKELETON  Hypermetabolic lesion involving the spinous process of the L3 vertebra is identified within SUV max equal to 5.7. Suspicious for bone metastasis.  IMPRESSION: 1. There is an endobronchial lesion within the left lower lobe airway which is concerning for primary bronchogenic carcinoma. There is a hypermetabolic lesion involving the spinous process of the L3 vertebra, suspicious for bone metastasis. 2. Nonspecific FDG uptake is identified within normal-sized right paratracheal and sub- carinal lymph nodes. 3. Persistent, suspicious pure ground-glass attenuating nodule within the left upper lobe exhibits low level FDG uptake. Note, PET-CT is of limited value in evaluating pure ground-glass nodules greater than 5 mm and is potentially  misleading and therefore not recommended. However, given the interval increase in size this remains worrisome for pulmonary adenocarcinoma. 4. Hypermetabolic nodule in the right lobe of thyroid gland. Hypermetabolic thyroid nodules on PET have up to 40-50% incidence of malignancy; recommend further evaluation with thyroid ultrasound and possible US-guided fine needle aspiration.   Electronically Signed  By: Kerby Moors M.D.  On: 07/18/2015 15:26   CLINICAL DATA: Left upper lobe pulmonary nodule. Tobacco abuse. Hyperlipidemia.  EXAM: CT CHEST WITHOUT CONTRAST  TECHNIQUE: Multidetector CT imaging of the chest was performed following the standard protocol without IV contrast.  COMPARISON: 10/09/2014, 01/21/2013  FINDINGS: Mediastinum/Nodes: Normal heart size. Stable ascending aortic ectasia measuring 3.9 cm. Coronary artery atherosclerosis in the left main, lad, first diagonal branch and RCA. No pericardial effusion. No mediastinal or hilar lymphadenopathy, but evaluation is limited on a noncontrast examination.  Lungs/Pleura: Stable left upper lobe ground-glass pulmonary nodule measuring 2.7  x 2.4 cm with no solid component. No other pulmonary nodules or pulmonary masses. No focal consolidation, pleural effusion or pneumothorax. Mild bronchial wall thickening. Mild centrilobular and paraseptal emphysema. Mild biapical scarring most consistent with prior infectious or inflammatory etiology.  Upper abdomen: Moderate-sized hiatal hernia. Stable hypodensities in the right and left hepatic lobe too small to characterize likely representing a small cyst unchanged compared with 10/09/2014.  Musculoskeletal: No aggressive lytic or sclerotic osseous lesion. No acute osseous abnormality.  Other: Mild retroareolar fibroglandular tissue bilaterally as can be seen with gynecomastia.  IMPRESSION: 1. 2.7 x 2.4 cm ground-glass left upper lobe pulmonary  nodule without a solid component the size has significantly increased compared with 01/21/2013. The appearance is concerning for an indolent malignancy such as an adenocarcinoma. 2. Coronary artery atherosclerosis. 3. Bilateral mild gynecomastia.   Electronically Signed  By: Kathreen Devoid  On: 06/23/2015 09:49  I have independently reviewed the above radiologic studies.  Needle Biopsy: Diagnosis Bone, biopsy - BENIGN SOFT TISSUE AND BLOOD, SEE COMMENT. - NEGATIVE FOR ATYPIA OR MALIGNANCY. Microscopic Comment The clinical impression of a spinal lesion is noted. Sections from the tissue submitted demonstrate predominantly blood in which there are fragments of benign muscle, adipose tissue and fibro-cartilaginous tissue. There are no definitive features of malignancy present. The case was reviewed with Dr. Tresa Moore who concurs. (CRR:ds 08/15/15) [place] RUND DO Pathologist, Electronic Signature (Case signed 08/15/2015)  Diagnosis Endobronchial biopsy, LLL - POSITIVE FOR SQUAMOUS CELL CARCINOMA. Microscopic Comment The diagnosis is called to Dr. Melvyn Novas on 07/27/15. Dr. Lyndon Code has seen this case in consultation with agreement. The tumor can be sent for molecular testing upon request. (RAH:gt, 07/27/15) Willeen Niece MD Pathologist, Electronic Signature   Bronchoscopy done : 07/26/2015 Noted: friable mass obst LLL at segmental level, o/w nl airways    Recent Lab Findings: Lab Results  Component Value Date   WBC 10.2 08/17/2015   HGB 14.8 08/17/2015   HCT 44.4 08/17/2015   PLT 276 08/17/2015   GLUCOSE 83 08/17/2015   CHOL 145 06/19/2015   TRIG 180.0* 06/19/2015   HDL 46.90 06/19/2015   LDLDIRECT 142.2 12/26/2008   LDLCALC 62 06/19/2015   ALT 14 08/17/2015   AST 13 08/17/2015   NA 140 08/17/2015   K 4.8 08/17/2015   CL 101 09/27/2014   CREATININE 0.8 08/17/2015   BUN 16.3 08/17/2015   CO2 28 08/17/2015   TSH 1.55 12/26/2008   INR 1.00 08/23/2015   Aortic Size Index=    4.0      /Body surface area is 1.94 meters squared. 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  Abnormal right lobe of thyroid.  Plan , full pft's, ultrasound of thyroid and biopsy if needed after [place]. Patient previous CT has been deleted so will repeat ct superd and plan to proceed with bronchoscopy, ebus to bx #7 nodes  And navigation to left lung lesion. If patient has cancer in upper and  lower left lobe or in #7 nodes will influnce decision about resection. At bronchoscopy will elevate closeness of left loer lower lung lesion    The goals risks and alternatives of the planned surgical procedure Bronchoscopy, EBUS with biopsy and navigational bronchoscopy and biopsy have been discussed with the patient in detail. The risks of the procedure including death, infection, stroke, myocardial infarction, bleeding, blood transfusion have all been discussed specifically.  I have quoted Fransisca Connors a 1 % of perioperative mortality and a complication rate as high as 10%. The patient's questions have been answered.Baltazar Pekala is willing  to proceed with the planned procedure.   Grace Isaac MD      [address] [place] [address] Office [phone]   Beeper [phone]  08/23/2015 12:22 PM

## 2015-08-23 NOTE — Anesthesia Procedure Notes (Addendum)
Procedure Name: Intubation Date/Time: 08/23/2015 12:48 PM Performed by: Jacquiline Doe A Pre-anesthesia Checklist: Patient identified, Timeout performed, Emergency Drugs available, Suction available and Patient being monitored Patient Re-evaluated:Patient Re-evaluated prior to inductionOxygen Delivery Method: Circle system utilized Preoxygenation: Pre-oxygenation with 100% oxygen Intubation Type: IV induction and Cricoid Pressure applied Ventilation: Mask ventilation without difficulty and Oral airway inserted - appropriate to patient size Laryngoscope Size: Mac and 4 Grade View: Grade I Tube type: Oral Tube size: 8.5 mm Airway Equipment and Method: Stylet and LTA kit utilized Placement Confirmation: ETT inserted through vocal cords under direct vision,  breath sounds checked- equal and bilateral and positive ETCO2 Secured at: 22 cm Tube secured with: Tape Dental Injury: Teeth and Oropharynx as per pre-operative assessment

## 2015-08-23 NOTE — Discharge Instructions (Signed)
Flexible Bronchoscopy, Care After Refer to this sheet in the next few weeks. These instructions provide you with information on caring for yourself after your procedure. Your health care provider may also give you more specific instructions. Your treatment has been planned according to current medical practices, but problems sometimes occur. Call your health care provider if you have any problems or questions after your procedure.  WHAT TO EXPECT AFTER THE PROCEDURE It is normal to have the following symptoms for 24-48 hours after the procedure:   Increased cough.  Low-grade fever.  Sore throat or hoarse voice.  Small streaks of blood in your thick spit (sputum) if tissue samples were taken (biopsy). HOME CARE INSTRUCTIONS   Do not eat or drink anything for 2 hours after your procedure. Your nose and throat were numbed by medicine. If you try to eat or drink before the medicine wears off, food or drink could go into your lungs or you could burn yourself. After the numbness is gone and your cough and gag reflexes have returned, you may eat soft food and drink liquids slowly.   The day after the procedure, you can go back to your normal diet.   You may resume normal activities.   Keep all follow-up visits as directed by your health care provider. It is important to keep all your appointments, especially if tissue samples were taken for testing (biopsy). SEEK IMMEDIATE MEDICAL CARE IF:   You have increasing shortness of breath.   You become light-headed or faint.   You have chest pain.   You have any new concerning symptoms.  You cough up more than a small amount of blood.  The amount of blood you cough up increases. MAKE SURE YOU:  Understand these instructions.  Will watch your condition.  Will get help right away if you are not doing well or get worse.   This information is not intended to replace advice given to you by your health care provider. Make sure you discuss  any questions you have with your health care provider.   Document Released: 05/10/2005 Document Revised: 11/11/2014 Document Reviewed: 06/25/2013 Elsevier Interactive Patient Education [employer].

## 2015-08-23 NOTE — Brief Op Note (Signed)
      [address]       [place] [address]             [phone]     08/23/2015  3:25 PM  PATIENT:  Fransisca Connors  74 y.o. male  PRE-OPERATIVE DIAGNOSIS:  Left upper lobe lung lesion , and left lower lobe lesion  POST-OPERATIVE DIAGNOSIS:  Left upper lobe lung lesion- lung cancer by quick stain  PROCEDURE:  Procedure(s): VIDEO BRONCHOSCOPY WITH ENDOBRONCHIAL ULTRASOUND (N/A)and transbronchial biopsy,  VIDEO BRONCHOSCOPY WITH ENDOBRONCHIAL NAVIGATION (N/A) and biopsy  SURGEON:  Surgeon(s) and Role:    * Grace Isaac, MD - Primary     ANESTHESIA:   general  EBL:  Total I/O In: 1200 [I.V.:1200] Out: -   BLOOD ADMINISTERED:none  DRAINS: none   LOCAL MEDICATIONS USED:  NONE  SPECIMEN:  Source of Specimen:  left upper lobe, #7 nodes and 4r node  DISPOSITION OF SPECIMEN:  PATHOLOGY  COUNTS:  YES   DICTATION: .Dragon Dictation  PLAN OF CARE: Discharge to home after PACU  PATIENT DISPOSITION:  PACU - hemodynamically stable.   Delay start of Pharmacological VTE agent (>24hrs) due to surgical blood loss or risk of bleeding: yes

## 2015-08-23 NOTE — Transfer of Care (Signed)
Immediate Anesthesia Transfer of Care Note  Patient: Robert Bates  Procedure(s) Performed: Procedure(s): VIDEO BRONCHOSCOPY WITH ENDOBRONCHIAL ULTRASOUND (N/A) VIDEO BRONCHOSCOPY WITH ENDOBRONCHIAL NAVIGATION (N/A)  Patient Location: PACU  Anesthesia Type:General  Level of Consciousness: awake, alert  and oriented  Airway & Oxygen Therapy: Patient Spontanous Breathing and Patient connected to nasal cannula oxygen  Post-op Assessment: Report given to RN and Post -op Vital signs reviewed and stable  Post vital signs: Reviewed and stable  Last Vitals:  Filed Vitals:   08/23/15 1054  BP: 135/89  Pulse: 58  Temp: 36.4 C  Resp: 18    Complications: No apparent anesthesia complications

## 2015-08-24 ENCOUNTER — Encounter (HOSPITAL_COMMUNITY): Payer: Self-pay | Admitting: Cardiothoracic Surgery

## 2015-08-24 ENCOUNTER — Ambulatory Visit
Admission: RE | Admit: 2015-08-24 | Discharge: 2015-08-24 | Disposition: A | Discharge status: Home / Self Care | Payer: Medicare Other | Source: Ambulatory Visit | Attending: Cardiothoracic Surgery | Admitting: Cardiothoracic Surgery

## 2015-08-24 DIAGNOSIS — E0590 Thyrotoxicosis, unspecified without thyrotoxic crisis or storm: Secondary | ICD-10-CM

## 2015-08-24 LAB — FUNGUS CULTURE W SMEAR
Fungal Smear: NONE SEEN
Special Requests: NORMAL

## 2015-08-24 NOTE — Op Note (Signed)
NAMESACHIT, GILMAN               ACCOUNT NO.:  [id number]  MEDICAL RECORD NO.:  [id number]  LOCATION:  [facility]                         FACILITY:  [facility]  PHYSICIAN:  Lanelle Bal, MD    DATE OF BIRTH:  April 12, 1941  DATE OF PROCEDURE:  08/23/2015 DATE OF DISCHARGE:  08/23/2015                              OPERATIVE REPORT   PREOPERATIVE DIAGNOSES: 1. Left lower lobe lung cancer, previously biopsied. 2. Left upper lobe suspicious lesion. 3. Hypermetabolic mediastinal node.  PROCEDURE PERFORMED:  Videobronchoscopy, EBUS with transbronchial biopsy of #7 and 4R lymph nodes, navigational bronchoscopy with biopsy brushings of left upper lobe lesion.  SURGEON:  Lanelle Bal, MD  BRIEF HISTORY:  The patient is a 74 year old male, who previously was brought by the Pulmonary Division and endobronchial lesion was noted in the left lower lobe bronchus.  There was no detailed description or photographs of this lesion.  In addition, on CT scan and PET scan, the patient had a 2.5 cm mass in the left upper lobe which was not biopsied and mildly hypermetabolic mediastinal nodes.  Further staging was recommended to the patient before deciding on an operative therapy.  The patient agreed and signed informed consent.  DESCRIPTION OF PROCEDURE:  The patient underwent general endotracheal anesthesia with a single-lumen endotracheal tube.  Appropriate time-out was performed.  Then we proceeded with bronchoscopy with videobronchoscope.  The right tracheobronchial tree was examined without evidence of endobronchial lesions.  We then carefully examined the left tracheobronchial tree.  The left upper lobe was without lesions. Takeoff of the left lower lobe was without lesions.  In the medial basilar segment was a partially obstructing lesion.  This area was photographed __________ in epic videotape .  Further biopsies of the left lower lobe were not performed as is previously been confirmed to  be non-small cell lung cancer.  The scope was then removed and EBUS placed and transbronchial biopsies were performed on a 4R node and 7 node. Neither of these nodes were particularly enlarged and the return on the multiple passes or with scant material.  We then moved to our previously created navigation bronchoscopy planned to the left upper lobe.  The standard bronchoscope was replaced.  The super D probe and working channel were then positioned.  The software was registered properly.  We then proceeded with following a bronchoscopy plan into the left upper lobe and came within half centimeter of a suspicious lesion and under fluoroscopic guidance passed needle brush, a trifurcated brush, and took 4 additional biopsies of this area.  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  The remaining tissue will be submitted to Pathology.  The scope was then removed.  The patient tolerated the procedure without obvious complication.  He was extubated in the operating room and transferred to the recovery room for further postoperative care.     Lanelle Bal, MD     EG/MEDQ  D:  08/24/2015  T:  08/24/2015  Job:  [id number]

## 2015-08-24 NOTE — Anesthesia Postprocedure Evaluation (Signed)
  Anesthesia Post-op Note  Patient: Robert Bates  Procedure(s) Performed: Procedure(s): VIDEO BRONCHOSCOPY WITH ENDOBRONCHIAL ULTRASOUND (N/A) VIDEO BRONCHOSCOPY WITH ENDOBRONCHIAL NAVIGATION (N/A)  Patient Location: PACU  Anesthesia Type:General  Level of Consciousness: awake  Airway and Oxygen Therapy: Patient Spontanous Breathing  Post-op Pain: mild  Post-op Assessment: Post-op Vital signs reviewed              Post-op Vital Signs: Reviewed  Last Vitals:  Filed Vitals:   08/23/15 1638  BP: 111/44  Pulse: 59  Temp:   Resp: 14    Complications: No apparent anesthesia complications

## 2015-08-25 ENCOUNTER — Ambulatory Visit (HOSPITAL_COMMUNITY)
Admission: RE | Admit: 2015-08-25 | Discharge: 2015-08-25 | Disposition: A | Discharge status: Home / Self Care | Payer: Medicare Other | Source: Ambulatory Visit | Attending: Cardiothoracic Surgery | Admitting: Cardiothoracic Surgery

## 2015-08-25 DIAGNOSIS — R911 Solitary pulmonary nodule: Secondary | ICD-10-CM

## 2015-08-25 DIAGNOSIS — J984 Other disorders of lung: Secondary | ICD-10-CM | POA: Diagnosis present

## 2015-08-25 LAB — PULMONARY FUNCTION TEST
DL/VA % pred: 67 %
DL/VA: 3.02 ml/min/mmHg/L
DLCO unc % pred: 61 %
DLCO unc: 18.35 ml/min/mmHg
FEF 25-75 Post: 1.74 L/sec
FEF 25-75 Pre: 1.29 L/sec
FEF2575-%Change-Post: 35 %
FEF2575-%Pred-Post: 83 %
FEF2575-%Pred-Pre: 61 %
FEV1-%Change-Post: 8 %
FEV1-%Pred-Post: 87 %
FEV1-%Pred-Pre: 81 %
FEV1-Post: 2.5 L
FEV1-Pre: 2.31 L
FEV1FVC-%Change-Post: -4 %
FEV1FVC-%Pred-Pre: 92 %
FEV6-%Change-Post: 6 %
FEV6-%Pred-Post: 97 %
FEV6-%Pred-Pre: 91 %
FEV6-Post: 3.59 L
FEV6-Pre: 3.37 L
FEV6FVC-%Change-Post: -5 %
FEV6FVC-%Pred-Post: 99 %
FEV6FVC-%Pred-Pre: 105 %
FVC-%Change-Post: 12 %
FVC-%Pred-Post: 98 %
FVC-%Pred-Pre: 87 %
FVC-Post: 3.87 L
FVC-Pre: 3.43 L
Post FEV1/FVC ratio: 65 %
Post FEV6/FVC ratio: 93 %
Pre FEV1/FVC ratio: 67 %
Pre FEV6/FVC Ratio: 98 %
RV % pred: 121 %
RV: 2.94 L
TLC % pred: 93 %
TLC: 6.24 L

## 2015-08-25 MED ORDER — ALBUTEROL SULFATE (2.5 MG/3ML) 0.083% IN NEBU
2.5000 mg | INHALATION_SOLUTION | Freq: Once | RESPIRATORY_TRACT | Status: AC
  Administered 2015-08-25: 2.5 mg via RESPIRATORY_TRACT

## 2015-08-29 ENCOUNTER — Encounter: Payer: Self-pay | Admitting: Cardiothoracic Surgery

## 2015-08-29 ENCOUNTER — Ambulatory Visit (INDEPENDENT_AMBULATORY_CARE_PROVIDER_SITE_OTHER): Payer: Medicare Other | Admitting: Cardiothoracic Surgery

## 2015-08-29 VITALS — BP 145/87 | HR 63 | Resp 20 | Ht 69.0 in | Wt 170.0 lb

## 2015-08-29 DIAGNOSIS — C3482 Malignant neoplasm of overlapping sites of left bronchus and lung: Secondary | ICD-10-CM | POA: Diagnosis not present

## 2015-08-30 NOTE — Progress Notes (Signed)
[address]       [place] [address]             [phone]                    [name] [place] Medical Record #[id number] Date of Birth: Sep 11, 1941  Referring: Robert Alar, NP Primary Care: Robert Pear., NP  Chief Complaint:    Chief Complaint  Patient presents with  . Routine Post Op    s/p EBUS 08/23/15, PFT's 08/25/15, Thyroid US 08/24/15   Stage IIB (T3, N0, M0) non-small cell lung cancer, squamous cell carcinoma presented with left lower lobe endobronchial lesion as well as suspicious groundglass opacity in the left upper lobe diagnosed in September 2016.  History of Present Illness:    Robert Bates 74 y.o. male is seen in the office  today for evaluation of left lower non small cell lung cancer bx by bronchoscopy. In addition he has a enlarging  Ground glass mass in the left upper lobe present for at least 2 years. Patient had needle biopsy of hypermetabolic spine lesion , was negative for mets. Patien tis lower term smoker smoking since age 30 more then pack per day.     Since last seen in the office the patient has undergone a bronchoscopy with ebus and biopsy of #7 nodes and also navigation bronchoscopy to the left upper lobe lesion. 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 By ebus and needle biopsy #7 node was negative, but poor quality specimen.   Current Activity/ Functional Status:  Patient is independent with mobility/ambulation, transfers, ADL's, IADL's.   Zubrod Score: At the time of surgery this patient's most appropriate activity status/level should be described as: '[]'$     0    Normal activity, no symptoms '[x]'$     1    Restricted in physical strenuous activity but ambulatory, able to do out light work '[]'$     2    Ambulatory and capable of self care, unable to do work activities, up and about               >50 % of waking hours                               '[]'$     3    Only limited self care, in bed greater than 50% of waking hours '[]'$     4    Completely disabled, no self care, confined to bed or chair '[]'$     5    Moribund   Past Medical History  Diagnosis Date  . GERD (gastroesophageal reflux disease)   . Alcoholism ([facility])     past problem per pt but admits to still drinking - unclear how much  . Tobacco abuse   . Hematuria     refuses work up or referral - understands risks of morbidity / mortality - 11/2008, 12/2008  . Hyperlipemia   . Kidney stones   . Meningioma ([facility]) 10/25/2013    Follows with Dr. [name].   . Peripheral vascular disease ([facility])     Abdominal Aortic Aneursym  . Pneumonia     as a child  . Anxiety   . History of hiatal hernia   . Kidney cyst, acquired   . Arthritis   . Cancer (  [facility]) 2016    lung    Past Surgical History  Procedure Laterality Date  . Tonsillectomy    . Hernia repair    . Video bronchoscopy Bilateral 07/26/2015    Procedure: VIDEO BRONCHOSCOPY WITH FLUORO;  Surgeon: [name], MD;  Location: [facility];  Service: Cardiopulmonary;  Laterality: Bilateral;  . Eye surgery Bilateral     Cataracts removed w/ lens implant  . Colonoscopy    . Video bronchoscopy with endobronchial ultrasound N/A 08/23/2015    Procedure: VIDEO BRONCHOSCOPY WITH ENDOBRONCHIAL ULTRASOUND;  Surgeon: Grace Isaac, MD;  Location: [facility];  Service: Thoracic;  Laterality: N/A;  . Video bronchoscopy with endobronchial navigation N/A 08/23/2015    Procedure: VIDEO BRONCHOSCOPY WITH ENDOBRONCHIAL NAVIGATION;  Surgeon: Grace Isaac, MD;  Location: [facility];  Service: Thoracic;  Laterality: N/A;    Family History  Problem Relation Age of Onset  . Leukemia Father   . Learning disabilities Son   . Leukemia Other   . Stroke Other   . Emphysema Father     Social History   Social History  . Marital Status: Married    Spouse Name: N/A  . Number of Children: 2  . Years of Education: N/A     Occupational History  . Retired     Administrator  .     Social History Main Topics  . Smoking status: Former Smoker -- 1.00 packs/day for 83 years    Quit date: 08/08/2015  . Smokeless tobacco: Former Systems developer    Types: Chew    Quit date: 11/04/1958     Comment: Started smoking at age 3.  Currently smoking 1 ppd.  . Alcohol Use: 0.0 oz/week    0 Standard drinks or equivalent per week     Comment: Pt reports rare use  . Drug Use: No  . Sexual Activity: Not on file   Other Topics Concern  . Not on file   Social History Narrative    History  Smoking status  . Former Smoker -- 1.00 packs/day for 57 years  . Quit date: 08/08/2015  Smokeless tobacco  . Former Systems developer  . Types: Chew  . Quit date: 11/04/1958    Comment: Started smoking at age 78.  Currently smoking 1 ppd.    History  Alcohol Use  . 0.0 oz/week  . 0 Standard drinks or equivalent per week    Comment: Pt reports rare use     Allergies  Allergen Reactions  . Iodine     REACTION: neck swells    Current Outpatient Prescriptions  Medication Sig Dispense Refill  . acetaminophen (TYLENOL) 500 MG tablet Take 1,000 mg by mouth every 6 (six) hours as needed for mild pain.    [name] aspirin EC 81 MG tablet Take 81 mg by mouth daily.    . Aspirin-Acetaminophen-Caffeine (GOODY HEADACHE PO) Take 1 Dose by mouth daily as needed.    [name] ibuprofen (ADVIL,MOTRIN) 200 MG tablet Take 400 mg by mouth every 6 (six) hours as needed for mild pain.    . meloxicam (MOBIC) 7.5 MG tablet Take 1 tablet (7.5 mg total) by mouth daily. (Patient taking differently: Take 7.5 mg by mouth daily as needed for pain. ) 30 tablet 2  . omeprazole (PRILOSEC) 40 MG capsule Take 1 capsule (40 mg total) by mouth daily. 90 capsule 1  . pravastatin (PRAVACHOL) 80 MG tablet Take 1 tablet (80 mg total) by mouth daily. 90 tablet 1   No  current facility-administered medications for this visit.      Review of Systems:     Cardiac Review of Systems: Y or  N  Chest Pain [  n  ]  Resting SOB [ n  ] Exertional SOB  Blue.Reese  ]  Orthopnea [ n ]   Pedal Edema [ n  ]    Palpitations [ n ] Syncope  [  n]   Presyncope [ n  ]  General Review of Systems: [Y] = yes [  ]=no Constitional: recent weight change [n  ];  Wt loss over the last 3 months [   ] anorexia [  ]; fatigue [  ]; nausea [  ]; night sweats [ n ]; fever [ n ]; or chills [  ];          Dental: poor dentition[  ]; Last Dentist visit:   Eye : blurred vision [  ]; diplopia [   ]; vision changes [  ];  Amaurosis fugax[  ]; Resp: cough [  ];  wheezing[ y ];  hemoptysis[n  ]; shortness of breath[n  ]; paroxysmal nocturnal dyspnea[  ]; dyspnea on exertion[  ]; or orthopnea[  ];  GI:  gallstones[  ], vomiting[  n];  dysphagia[  ]; melena[  ];  hematochezia [  ]; heartburn[  ];   Hx of  Colonoscopy[  ]; GU: kidney stones [  ]; hematuria[  ];   dysuria [  ];  nocturia[  ];  history of     obstruction [  ]; urinary frequency [  ]             Skin: rash, swelling[  ];, hair loss[  ];  peripheral edema[  ];  or itching[  ]; Musculosketetal: myalgias[  ];  joint swelling[  ];  joint erythema[  ];  joint pain[  ];  back pain[  ];  Heme/Lymph: bruising[  ];  bleeding[  ];  anemia[  ];  Neuro: TIA[  ];  headaches[  ];  stroke[  ];  vertigo[  ];  seizures[  ];   paresthesias[  ];  difficulty walking[  ];  Psych:depression[  ]; anxiety[  ];  Endocrine: diabetes[ n ];  thyroid dysfunction[ n ];  Immunizations: Flu up to date [  ]; Pneumococcal up to date [  ];  Other:  Physical Exam: BP 145/87 mmHg  Pulse 63  Resp 20  Ht '5\' 9"'$  (1.753 m)  Wt 170 lb (77.111 kg)  BMI 25.09 kg/m2  SpO2 98%  PHYSICAL EXAMINATION: General appearance: alert, cooperative and no distress Head: Normocephalic, without obvious abnormality, atraumatic Neck: no adenopathy, no carotid bruit, no JVD, supple, symmetrical, trachea midline and thyroid not enlarged, symmetric, no tenderness/mass/nodules Lymph nodes: Cervical, supraclavicular,  and axillary nodes normal. Resp: diminished breath sounds bilaterally Back: symmetric, no curvature. ROM normal. No CVA tenderness. Cardio: regular rate and rhythm, S1, S2 normal, no murmur, click, rub or gallop GI: soft, non-tender; bowel sounds normal; no masses,  no organomegaly Extremities: extremities normal, atraumatic, no cyanosis or edema and Homans sign is negative, no sign of DVT Neurologic: Grossly normal  Diagnostic Studies & Laboratory data:     Recent Radiology Findings:   Dg Chest 2 View  08/23/2015  CLINICAL DATA:  Left lung lesion.  Preop examination. EXAM: CHEST  2 VIEW COMPARISON:  Chest CT 08/23/2015 FINDINGS: The ground-glass opacity in the left upper lobe or the endobronchial lesion in the left  lower lobe cannot be visualized by plain film. Minimal atelectasis in the left lung base. Right lung is clear. Heart is normal size. No effusions. No acute bony abnormality. IMPRESSION: Left base atelectasis. Electronically Signed   By: Rolm Baptise M.D.   On: 08/23/2015 12:14   Mr Jeri Cos GE Contrast  08/15/2015  CLINICAL DATA:  Recently diagnosed left lower lobe lung cancer. Staging. EXAM: MRI HEAD WITHOUT AND WITH CONTRAST TECHNIQUE: Multiplanar, multiecho pulse sequences of the brain and surrounding structures were obtained without and with intravenous contrast. CONTRAST:  49m MULTIHANCE GADOBENATE DIMEGLUMINE 529 MG/ML IV SOLN COMPARISON:  08/14/2013 and 07/31/2013 FINDINGS: Some sequences are mildly motion degraded. There is mild generalized cerebral atrophy. No acute infarct, intracranial hemorrhage, intra-axial mass, midline shift, or extra-axial fluid collection is identified. Small foci of T2 hyperintensity in the subcortical and deep cerebral white matter are nonspecific but compatible with minimal chronic small vessel ischemic disease, less than is often seen in patients of this age. Homogeneously enhancing extra-axial mass with dural tail along the planum sphenoidale has  not significantly changed in size, measuring 1.6 x 1.4 x 1.0 cm (previously 1.7 x 1.6 x 0.9 cm). This mass again extends slightly into the anterior aspect of the sella. The prechiasmatic right optic nerve is again noted to be in close proximity to this mass. Extra-axial homogeneously enhancing mass at the right frontal skull vertex is also unchanged in size, measuring 3.2 x 2.6 x 2.1 cm. There is mild mass effect on the underlying right frontal lobe with very mild brain edema, increased from prior. There is no evidence of superior sagittal sinus invasion. No new enhancing lesions are identified. Prior bilateral cataract extraction is noted. There is chronic right maxillary sinusitis with moderate circumferential mucosal thickening as well as central fluid. A large mucous retention cyst and small amount of fluid are present in the left maxillary sinus. There is also mucosal thickening in subtotal opacification of the right frontal sinus and mild mucosal thickening in the left frontal sinus, and there is opacification of multiple anterior ethmoid air cells bilaterally. Mild left sphenoid sinus mucosal thickening is noted. No significant mastoid effusion is seen. Major intracranial vascular flow voids are preserved, with the left vertebral artery being dominant. IMPRESSION: 1. No evidence of intracranial metastases. 2. Unchanged right frontal vertex meningioma. Increased, very mild edema in the adjacent frontal lobe. 3. Unchanged planum sphenoidale meningioma. 4. Acute on chronic sinusitis. Electronically Signed   By: ALogan BoresM.D.   On: 08/15/2015 21:14   [place] Tissue Head/neck  08/24/2015  CLINICAL DATA:  Overactive thyroid gland. EXAM: THYROID ULTRASOUND TECHNIQUE: Ultrasound examination of the thyroid gland and adjacent soft tissues was performed. COMPARISON:  None. FINDINGS: Right thyroid lobe Measurements: 4.0 x 1.7 x 1.6 cm. Several thyroid nodules are noted, with the largest being solid nodule  measuring 1.3 x 1.0 x 0.9 cm in upper pole. Another solid nodule measuring 1 cm is noted in lower pole. Left thyroid lobe Measurements: 3.6 x 1.5 x 1.4 cm. 3 mm cystic nodule is noted in lower pole. Isthmus Thickness: 4 mm.  No nodules visualized. Lymphadenopathy None visualized. IMPRESSION: Bilateral thyroid nodules are noted, with the largest being 1.3 cm solid nodule in upper pole of right thyroid lobe. Findings do not meet current SRU consensus criteria for biopsy. Follow-up by clinical exam is recommended. If patient has known risk factors for thyroid carcinoma, consider follow-up ultrasound in 12 months. If patient is clinically hyperthyroid, consider nuclear medicine  thyroid uptake and scan.Reference: Management of Thyroid Nodules Detected at [place]: Society of Radiologists in [place]. Radiology 2005; [id number]. Electronically Signed   By: Marijo Conception, M.D.   On: 08/24/2015 15:36   Ct Biopsy  08/14/2015  CLINICAL DATA:  Hypermetabolic left hilar lesion. Focus of hypermetabolic activity at the margin of the lumbar L3 vertebral spinous process. EXAM: CT GUIDED CORE BONE BIOPSY OF LUMBAR L3 SPINOUS PROCESS LESION ANESTHESIA/SEDATION: Intravenous Fentanyl and Versed were administered as conscious sedation during continuous cardiorespiratory monitoring by the radiology RN, with a total moderate sedation time of 12 minutes. PROCEDURE: The procedure risks, benefits, and alternatives were explained to the patient. Questions regarding the procedure were encouraged and answered. The patient understands and consents to the procedure. Patient placed prone. Select axial scans through the lumbar spine obtained. The lesion was localized and an appropriate skin entry site was determined and marked. The operative field was prepped with chlorhexidinein a sterile fashion, and a sterile drape was applied covering the operative field. A sterile gown and sterile gloves were used for the  procedure. Local anesthesia was provided with 1% Lidocaine. Under CT fluoroscopic guidance, a 11 gauge cook osteosite bone needle was advanced to the margin of the lesion. Once needle tip position was confirmed, multiple core biopsy samples were obtained, submitted in formalin to surgical pathology. The needle was removed. Postprocedure scans reveal no hematoma or other apparent complication. The patient tolerated the procedure well. COMPLICATIONS: None immediate FINDINGS: The bone abnormality corresponding to the site hypermetabolic activity on PET-CT was localized. Multiple core samples obtained of this region under CT fluoroscopic guidance as above. IMPRESSION: 1. Technically successful core bone biopsy of L3 spinous process lesion under CT guidance. Electronically Signed   By: Lucrezia Europe M.D.   On: 08/14/2015 15:00   Ct Super D Chest Wo Contrast  08/23/2015  CLINICAL DATA:  Left upper lobe lesion. Pre-op for bronchoscopy/surgery planning. Subsequent encounter. EXAM: CT CHEST WITHOUT CONTRAST TECHNIQUE: Multidetector CT imaging of the chest was performed using thin slice collimation for electromagnetic bronchoscopy planning purposes, without intravenous contrast. COMPARISON:  PET-CT 07/18/2015 and chest CT 06/23/2015. FINDINGS: Mediastinum/Nodes: There are no enlarged mediastinal, hilar or axillary lymph nodes.The mildly hypermetabolic right paratracheal node is unchanged, measuring 8 mm short axis on image 23. Nodularity superiorly in the right thyroid lobe appears unchanged. There is a moderate size hiatal hernia. The heart size is normal. There is no pericardial effusion. Diffuse atherosclerosis of the aorta, great vessels and coronary arteries noted. Lungs/Pleura: There is no pleural effusion. Again demonstrated is an endobronchial lesion within the anteromedial segment of the left lower lobe, best seen on the reformatted images. This measures up to 2.5 cm on sagittal image number 76. There is minimal  posterior obstructive atelectasis in the anteromedial left lower lobe. The focal ground-glass opacity in the left upper lobe is not significantly changed, measuring approximately 3.1 x 2.9 cm. This demonstrates no progressive solid components. No other suspicious pulmonary findings. Scattered tiny right upper lobe nodules are stable. There is mild emphysema. Upper abdomen: No suspicious findings are demonstrated within the visualized upper abdomen. Small hepatic cysts and a left adrenal adenoma are unchanged. Musculoskeletal/Chest wall: There is no chest wall mass or suspicious osseous finding. Mild bilateral gynecomastia noted. IMPRESSION: 1. Stable endobronchial lesion within the anteromedial segment of the left lower lobe with associated mild postobstructive atelectasis. This was hypermetabolic on PET-CT, concerning for squamous cell carcinoma or neuroendocrine tumor. 2. Stable left upper  lobe ground-glass opacity. 3. Stable normal size but mildly hypermetabolic right paratracheal lymph node. Electronically Signed   By: Richardean Sale M.D.   On: 08/23/2015 11:00   Dg C-arm Bronchoscopy  08/23/2015  CLINICAL DATA:  C-ARM BRONCHOSCOPY Fluoroscopy was utilized by the requesting physician.  No radiographic interpretation.  PET:  CLINICAL DATA: Initial treatment strategy for Lung cancer.  EXAM: NUCLEAR MEDICINE PET SKULL BASE TO THIGH  TECHNIQUE: 8.45 mCi F-18 FDG was injected intravenously. Full-ring PET imaging was performed from the skull base to thigh after the radiotracer. CT data was obtained and used for attenuation correction and anatomic localization.  FASTING BLOOD GLUCOSE: Value: 9.6 mg/dl  COMPARISON: 06/23/2015  FINDINGS: NECK  Within the upper pole of the right lobe of thyroid gland there is a hypermetabolic nodule measuring 1.4 cm and has an SUV max equal to 5.74. No hypermetabolic cervical lymph nodes identified.  CHEST  Right paratracheal lymph node is normal  measuring 8 mm but has an SUV max equal to 4.19. Mild FDG uptake associated with sub carinal lymph node has an SUV max equal to 3.86. Low attenuation nodule within the left adrenal gland measures 1.5 cm and is unchanged from 7/ There is a endobronchial lesion within the left lower lobe bronchi measuring approximately 2 cm. This has an SUV max equal to 23.8. And is worrisome for primary bronchogenic carcinoma. Sub solid scratch sec the pure ground-glass attenuating nodule within the left upper lobe measures 2.8 cm and has an SUV max equal to 1.25.  ABDOMEN/PELVIS  No abnormal hypermetabolic activity within the liver, pancreas, adrenal glands, or spleen. There is a large hiatal hernia identified. Aortic atherosclerosis noted. The infrarenal abdominal aorta has a AP diameter of 2.8 cm. No hypermetabolic lymph nodes in the abdomen or pelvis.  SKELETON  Hypermetabolic lesion involving the spinous process of the L3 vertebra is identified within SUV max equal to 5.7. Suspicious for bone metastasis.  IMPRESSION: 1. There is an endobronchial lesion within the left lower lobe airway which is concerning for primary bronchogenic carcinoma. There is a hypermetabolic lesion involving the spinous process of the L3 vertebra, suspicious for bone metastasis. 2. Nonspecific FDG uptake is identified within normal-sized right paratracheal and sub- carinal lymph nodes. 3. Persistent, suspicious pure ground-glass attenuating nodule within the left upper lobe exhibits low level FDG uptake. Note, PET-CT is of limited value in evaluating pure ground-glass nodules greater than 5 mm and is potentially misleading and therefore not recommended. However, given the interval increase in size this remains worrisome for pulmonary adenocarcinoma. 4. Hypermetabolic nodule in the right lobe of thyroid gland. Hypermetabolic thyroid nodules on PET have up to 40-50% incidence of malignancy; recommend further  evaluation with thyroid ultrasound and possible US-guided fine needle aspiration.   Electronically Signed  By: Kerby Moors M.D.  On: 07/18/2015 15:26      Result Notes     Notes Recorded by [name], MD on 07/19/2015 at 9:57 AM Discussed with patient by phone > Discussed in detail all the indications, usual risks and alternatives relative to the benefits with patient who agrees to proceed with bronchoscopy with biopsy. On 07/26/15          Vitals     Height Weight BMI (Calculated)    '5\' 9"'$  (1.753 m) 171 lb 3.2 oz (77.656 kg) 25.3      Interpretation Summary     CLINICAL DATA: Initial treatment strategy for Lung cancer.  EXAM: NUCLEAR MEDICINE PET SKULL BASE TO  THIGH  TECHNIQUE: 8.45 mCi F-18 FDG was injected intravenously. Full-ring PET imaging was performed from the skull base to thigh after the radiotracer. CT data was obtained and used for attenuation correction and anatomic localization.  FASTING BLOOD GLUCOSE: Value: 9.6 mg/dl  COMPARISON: 06/23/2015  FINDINGS: NECK  Within the upper pole of the right lobe of thyroid gland there is a hypermetabolic nodule measuring 1.4 cm and has an SUV max equal to 5.74. No hypermetabolic cervical lymph nodes identified.  CHEST  Right paratracheal lymph node is normal measuring 8 mm but has an SUV max equal to 4.19. Mild FDG uptake associated with sub carinal lymph node has an SUV max equal to 3.86. Low attenuation nodule within the left adrenal gland measures 1.5 cm and is unchanged from 7/ There is a endobronchial lesion within the left lower lobe bronchi measuring approximately 2 cm. This has an SUV max equal to 23.8. And is worrisome for primary bronchogenic carcinoma. Sub solid scratch sec the pure ground-glass attenuating nodule within the left upper lobe measures 2.8 cm and has an SUV max equal to 1.25.  ABDOMEN/PELVIS  No abnormal hypermetabolic activity within the  liver, pancreas, adrenal glands, or spleen. There is a large hiatal hernia identified. Aortic atherosclerosis noted. The infrarenal abdominal aorta has a AP diameter of 2.8 cm. No hypermetabolic lymph nodes in the abdomen or pelvis.  SKELETON  Hypermetabolic lesion involving the spinous process of the L3 vertebra is identified within SUV max equal to 5.7. Suspicious for bone metastasis.  IMPRESSION: 1. There is an endobronchial lesion within the left lower lobe airway which is concerning for primary bronchogenic carcinoma. There is a hypermetabolic lesion involving the spinous process of the L3 vertebra, suspicious for bone metastasis. 2. Nonspecific FDG uptake is identified within normal-sized right paratracheal and sub- carinal lymph nodes. 3. Persistent, suspicious pure ground-glass attenuating nodule within the left upper lobe exhibits low level FDG uptake. Note, PET-CT is of limited value in evaluating pure ground-glass nodules greater than 5 mm and is potentially misleading and therefore not recommended. However, given the interval increase in size this remains worrisome for pulmonary adenocarcinoma. 4. Hypermetabolic nodule in the right lobe of thyroid gland. Hypermetabolic thyroid nodules on PET have up to 40-50% incidence of malignancy; recommend further evaluation with thyroid ultrasound and possible US-guided fine needle aspiration.   Electronically Signed  By: Kerby Moors M.D.  On: 07/18/2015 15:26   CLINICAL DATA: Left upper lobe pulmonary nodule. Tobacco abuse. Hyperlipidemia.  EXAM: CT CHEST WITHOUT CONTRAST  TECHNIQUE: Multidetector CT imaging of the chest was performed following the standard protocol without IV contrast.  COMPARISON: 10/09/2014, 01/21/2013  FINDINGS: Mediastinum/Nodes: Normal heart size. Stable ascending aortic ectasia measuring 3.9 cm. Coronary artery atherosclerosis in the left main, lad, first diagonal branch and  RCA. No pericardial effusion. No mediastinal or hilar lymphadenopathy, but evaluation is limited on a noncontrast examination.  Lungs/Pleura: Stable left upper lobe ground-glass pulmonary nodule measuring 2.7 x 2.4 cm with no solid component. No other pulmonary nodules or pulmonary masses. No focal consolidation, pleural effusion or pneumothorax. Mild bronchial wall thickening. Mild centrilobular and paraseptal emphysema. Mild biapical scarring most consistent with prior infectious or inflammatory etiology.  Upper abdomen: Moderate-sized hiatal hernia. Stable hypodensities in the right and left hepatic lobe too small to characterize likely representing a small cyst unchanged compared with 10/09/2014.  Musculoskeletal: No aggressive lytic or sclerotic osseous lesion. No acute osseous abnormality.  Other: Mild retroareolar fibroglandular tissue bilaterally as can be seen  with gynecomastia.  IMPRESSION: 1. 2.7 x 2.4 cm ground-glass left upper lobe pulmonary nodule without a solid component the size has significantly increased compared with 01/21/2013. The appearance is concerning for an indolent malignancy such as an adenocarcinoma. 2. Coronary artery atherosclerosis. 3. Bilateral mild gynecomastia.   Electronically Signed  By: Kathreen Devoid  On: 06/23/2015 09:49  I have independently reviewed the above radiologic studies.  Needle Biopsy: Diagnosis Bone, biopsy - BENIGN SOFT TISSUE AND BLOOD, SEE COMMENT. - NEGATIVE FOR ATYPIA OR MALIGNANCY. Microscopic Comment The clinical impression of a spinal lesion is noted. Sections from the tissue submitted demonstrate predominantly blood in which there are fragments of benign muscle, adipose tissue and fibro-cartilaginous tissue. There are no definitive features of malignancy present. The case was reviewed with Dr. Tresa Moore who concurs. 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 Microscopic Comment The diagnosis is called to Dr. Melvyn Novas on 07/27/15. Dr. Lyndon Code has seen this case in consultation with agreement. The tumor can be sent for molecular testing upon request. 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 This case also reviewed by Dr. Saralyn Pilar. Casimer Lanius MD    Recent Lab Findings: Lab Results  Component Value Date   WBC 10.2 08/17/2015   HGB 14.8 08/17/2015   HCT 44.4 08/17/2015   PLT 276 08/17/2015   GLUCOSE 83 08/17/2015   CHOL 145 06/19/2015   TRIG 180.0* 06/19/2015   HDL 46.90 06/19/2015   LDLDIRECT 142.2 12/26/2008   LDLCALC 62 06/19/2015   ALT 14 08/17/2015   AST 13 08/17/2015   NA 140 08/17/2015   K 4.8 08/17/2015   CL 101 09/27/2014   CREATININE 0.8 08/17/2015   BUN 16.3 08/17/2015   CO2 28 08/17/2015   TSH 1.55 12/26/2008   INR 1.00 08/23/2015   Aortic Size Index=   4.0      /Body surface area is 1.94 meters squared. =2.06 < 2.75 cm/m2      4% risk per year 2.75 to 4.25          8% risk per year > 4.25 cm/m2    20% risk per year  Interpretation: The FEV1 is normal, but the FEV1/FVC ratio and FEF25-75% are reduced. The airway resistance is normal. While the TLC, FRC and SVC are within normal limits, the RV is increased. Following administration of bronchodilators, there is a significant response indicated by the increased FVC. The reduced diffusing capacity indicates a moderate  loss of functional alveolar capillary surface. However, the diffusing capacity was not corrected for the patient's hemoglobin. Conclusions: Although there is airway obstruction and a diffusion defect suggesting emphysema, the absence of overinflation is inconsistent with that diagnosis. The response to bronchodilators indicates a reversible component. 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 The patient PFTs are suitable for pneumonectomy, his overall functional status is slightly more tenuous. I discussed with him the pros and cons of pneumonectomy. He is somewhat reluctant to proceed with surgical resection while he is considering his options have offered to make arrange for him to discuss stereotactic radiotherapy with Dr. Pablo Ledger.  After he seen by radiation he will return to discuss and will make a final decision about mode of treatment, in addition his case will be again discussed and the multiple disc [facility] thoracic oncology conference.  I  spent 40 minutes counseling the patient face to face and 50% or  more the  time was spent in counseling and coordination of care. The total time spent in the appointment was 60 minutes.  Grace Isaac MD      [address] [place] [address] Office [phone]   Beeper [phone]  08/30/2015 3:56 PM

## 2015-09-05 ENCOUNTER — Telehealth: Payer: Self-pay

## 2015-09-05 NOTE — Telephone Encounter (Signed)
Patient made aware of appointment cahnge Robert Bates)

## 2015-09-06 ENCOUNTER — Ambulatory Visit
Admission: RE | Admit: 2015-09-06 | Discharge: 2015-09-06 | Disposition: A | Discharge status: Home / Self Care | Payer: Medicare Other | Source: Ambulatory Visit | Attending: Radiation Oncology | Admitting: Radiation Oncology

## 2015-09-06 VITALS — BP 119/65 | HR 64 | Temp 98.0°F | Resp 16 | Ht 66.0 in | Wt 176.0 lb

## 2015-09-06 DIAGNOSIS — Z510 Encounter for antineoplastic radiation therapy: Secondary | ICD-10-CM | POA: Diagnosis present

## 2015-09-06 DIAGNOSIS — C3432 Malignant neoplasm of lower lobe, left bronchus or lung: Secondary | ICD-10-CM | POA: Diagnosis present

## 2015-09-06 NOTE — Progress Notes (Signed)
   Department of Radiation Oncology  Phone:  [phone] Fax:        [phone]   Name: Robert Bates MRN: [id number]  DOB: 1941-03-20  Date: 09/06/2015  Follow Up Visit Note  Diagnosis: Lung cancer [facility])   Staging form: Lung, AJCC 7th Edition     Clinical stage from 08/18/2015: Stage IIB (T3, N0, M0) - Signed by Curt Bears, MD on 08/18/2015   Interval History: Robert Bates presents today for routine followup. Robert Bates has undergone biopsy of the left upper lobe mass which showed adenocarcinoma. His left lower lobe endobronchial biopsy was positive for squamous cell carcinoma. He has discussed surgery with Dr. Servando Snare and has elected to pursue radiation instead. He is accompanied by his wife. He would like to continue working. He drives a truck at night. He has no hemoptysis, weight loss, or cough. He has quit smoking.  Physical Exam:  Filed Vitals:   09/06/15 0832  BP: 119/65  Pulse: 64  Temp: 98 F (36.7 C)  TempSrc: Oral  Resp: 16  Height: '5\' 6"'$  (1.676 m)  Weight: 176 lb (79.833 kg)  SpO2: 100%  Pleasant male in no distress sitting comfortably on exam room table. Alert and aware x3. Normal respiratory effort.  IMPRESSION: Robert Bates is a 74 y.o. 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  PLAN:  We discussed that he has stage I lung cancer and the results of hypofractionated radiation will provide a greater than 90% local control. We discussed that he may fail in the mediastinum hilum or elsewhere in the body and radiation was a local only process. We discussed the process of simulation and the use of a paddle to decrease respiratory motion. We discussed the use of 4-dimensional simulation to minimize normal lung tissue treated and the use of respiratory compression. We discussed 27 treatments occurring every other day as an outpatient. We discussed these treatments will last about 10-20 minutes and he would be here at the hospital for about an hour. We discussed  that radiation  was unlikely to make his breathing any worse. It is also unlikely to make his breathing symptoms any better. We discussed possible side effects including his shoulder pain due to arm positioning and possible rib fracture withthe pleural-based nodules proximity to the ribs. We discussed damage to other critical normal structures including heart, ribs, lung collapse, chronic cough, and brachial plexus injury. We discussed that without treatment this  could develop into a more aggressive or even metastatic cancer.   His central lesion is not amenable to SBRT but can be treated with hypofractionation (70.2 Gy in 27 fractions via the CALGB regiment) with similar results which I have explained to him and his wife. He has signed informed consent and agreed to proceed forward.   I spent 40 minutes face to face with the patient and more than 50% of that time was spent in counseling and/or coordination of care.    He is scheduled for simulation tomorrow at 10 am.    Thea Silversmith, MD  This document serves as a record of services personally performed by Thea Silversmith, MD. It was created on her behalf by  Lendon Collar, a trained medical scribe. The creation of this record is based on the scribe's personal observations and the provider's statements to them. This document has been checked and approved by the attending provider.

## 2015-09-07 ENCOUNTER — Encounter: Payer: Medicare Other | Admitting: Cardiothoracic Surgery

## 2015-09-07 ENCOUNTER — Ambulatory Visit
Admission: RE | Admit: 2015-09-07 | Discharge: 2015-09-07 | Disposition: A | Discharge status: Home / Self Care | Payer: Medicare Other | Source: Ambulatory Visit | Attending: Radiation Oncology | Admitting: Radiation Oncology

## 2015-09-07 DIAGNOSIS — C3432 Malignant neoplasm of lower lobe, left bronchus or lung: Secondary | ICD-10-CM

## 2015-09-07 DIAGNOSIS — Z510 Encounter for antineoplastic radiation therapy: Secondary | ICD-10-CM | POA: Diagnosis not present

## 2015-09-07 LAB — AFB CULTURE WITH SMEAR (NOT AT ARMC)
Acid Fast Smear: NONE SEEN
Special Requests: NORMAL

## 2015-09-07 NOTE — Addendum Note (Signed)
Encounter addended by: Heywood Footman, RN on: 09/07/2015 11:31 AM<BR>     Documentation filed: Charges VN

## 2015-09-07 NOTE — Addendum Note (Signed)
Encounter addended by: Heywood Footman, RN on: 09/07/2015 11:34 AM<BR>     Documentation filed: Arn Medal VN

## 2015-09-07 NOTE — Progress Notes (Signed)
[facility] Simulation and Treatment Planning Note   Name: Robert Bates MRN: [id number]  Date: 09/07/2015  DOB: 07-04-1941  Status: Outpatient:[id number]    DIAGNOSIS: Lung cancer ([place])   Staging form: Lung, AJCC 7th Edition     Clinical stage from 08/18/2015: Stage IIB (T3, N0, M0) - Signed by Curt Bears, MD on 08/18/2015   CONSENT VERIFIED: yes   SET UP AND IMMOBILIZATION: Patient is setup supine with arms in a wing board.   NARRATIVE: The patient was brought to the [facility].  Identity was confirmed.  All relevant records and images related to the planned course of therapy were reviewed.  Then, the patient was positioned in a stable reproducible clinical set-up for radiation therapy.  CT images were obtained.  Skin markings were placed.  The CT images were loaded into the planning software where the target and avoidance structures were contoured.  The radiation prescription was entered and confirmed.   TREATMENT PLANNING NOTE:  Treatment planning then occurred. I have requested 3D simulation with [facility] of the spinal cord, total lungs and gross tumor volume. I have also requested mlcs and an isodose plan.    This document serves as a record of services personally performed by Thea Silversmith , MD. It was created on her behalf by Lenn Cal, a trained medical scribe. The creation of this record is based on the scribe's personal observations and the provider's statements to them. This document has been checked and approved by the attending provider.   ------------------------------------------------  Thea Silversmith, MD

## 2015-09-15 DIAGNOSIS — Z510 Encounter for antineoplastic radiation therapy: Secondary | ICD-10-CM | POA: Diagnosis not present

## 2015-09-18 ENCOUNTER — Ambulatory Visit: Payer: Medicare Other | Admitting: Radiation Oncology

## 2015-09-18 ENCOUNTER — Telehealth: Payer: Self-pay | Admitting: *Deleted

## 2015-09-18 DIAGNOSIS — Z510 Encounter for antineoplastic radiation therapy: Secondary | ICD-10-CM | POA: Diagnosis not present

## 2015-09-18 NOTE — Telephone Encounter (Signed)
Oncology Nurse Navigator Documentation  Oncology Nurse Navigator Flowsheets 09/18/2015  Navigator Encounter Type Telephone/called to follow up with patient today.  He received his first XRT tx today.  I spoke with his wife.  He apparently is doing well, without complaints.  I listened as she explained.  I asked that she call if needed.   Patient Visit Type Follow-up  Treatment Phase Treatment;First Radiation Tx  Barriers/Navigation Needs No barriers at this time  Time Spent with Patient 15

## 2015-09-19 ENCOUNTER — Encounter: Payer: Self-pay | Admitting: Radiation Oncology

## 2015-09-19 ENCOUNTER — Ambulatory Visit: Payer: Medicare Other

## 2015-09-19 ENCOUNTER — Ambulatory Visit
Admission: RE | Admit: 2015-09-19 | Discharge: 2015-09-19 | Disposition: A | Discharge status: Home / Self Care | Payer: Medicare Other | Source: Ambulatory Visit | Attending: Radiation Oncology | Admitting: Radiation Oncology

## 2015-09-19 VITALS — BP 97/81 | HR 68 | Resp 16 | Wt 177.4 lb

## 2015-09-19 DIAGNOSIS — Z510 Encounter for antineoplastic radiation therapy: Secondary | ICD-10-CM | POA: Diagnosis not present

## 2015-09-19 DIAGNOSIS — C3482 Malignant neoplasm of overlapping sites of left bronchus and lung: Secondary | ICD-10-CM

## 2015-09-19 MED ORDER — RADIAPLEXRX EX GEL
Freq: Once | CUTANEOUS | Status: AC
  Administered 2015-09-19: 10:00:00 via TOPICAL

## 2015-09-19 NOTE — Progress Notes (Signed)
Weekly Management Note Current Dose: 5.2  Gy  Projected Dose:70.2  Gy   Narrative:  The patient presents for routine under treatment assessment.  CBCT/MVCT images/Port film x-rays were reviewed.  The chart was checked. Doing well. No complaints. Back in town this morning at [place].   Physical Findings: Weight: 177 lb 6.4 oz (80.468 kg). Unchanged  Impression:  The patient is tolerating radiation.  Plan:  Continue treatment as planned.

## 2015-09-19 NOTE — Progress Notes (Addendum)
Weight stable. BP low. Patient denies feeling dizzy or lightheaded. Denies pain. Denies cough, SOB, or difficulty swallowing. No skin changes within treatment field.  Denies fatigue. Patient without complaints at this time. Oriented patient to staff and routine of the clinic. Provided patient with RADIATION THERAPY AND YOU handbook then, reviewed pertinent information. Educated patient reference potential side effects and management such as skin changes, throat changes, and fatigue. Provided patient with RADIAPLEX and directed upon use. Patient verbalized understanding of all reviewed.   BP 97/81 mmHg  Pulse 68  Resp 16  Wt 177 lb 6.4 oz (80.468 kg)  SpO2 100% Wt Readings from Last 3 Encounters:  09/19/15 177 lb 6.4 oz (80.468 kg)  09/06/15 176 lb (79.833 kg)  08/29/15 170 lb (77.111 kg)

## 2015-09-20 ENCOUNTER — Ambulatory Visit: Payer: Medicare Other

## 2015-09-20 ENCOUNTER — Encounter: Payer: Medicare Other | Admitting: Cardiothoracic Surgery

## 2015-09-20 DIAGNOSIS — Z510 Encounter for antineoplastic radiation therapy: Secondary | ICD-10-CM | POA: Diagnosis not present

## 2015-09-21 ENCOUNTER — Ambulatory Visit: Payer: Medicare Other

## 2015-09-21 DIAGNOSIS — Z510 Encounter for antineoplastic radiation therapy: Secondary | ICD-10-CM | POA: Diagnosis not present

## 2015-09-22 ENCOUNTER — Encounter: Payer: Self-pay | Admitting: Family

## 2015-09-22 ENCOUNTER — Ambulatory Visit: Payer: Medicare Other

## 2015-09-22 ENCOUNTER — Ambulatory Visit (INDEPENDENT_AMBULATORY_CARE_PROVIDER_SITE_OTHER): Payer: Medicare Other | Admitting: Family

## 2015-09-22 VITALS — BP 131/75 | HR 59 | Temp 97.9°F | Resp 16 | Ht 69.0 in | Wt 174.2 lb

## 2015-09-22 DIAGNOSIS — C3482 Malignant neoplasm of overlapping sites of left bronchus and lung: Secondary | ICD-10-CM

## 2015-09-22 DIAGNOSIS — Z510 Encounter for antineoplastic radiation therapy: Secondary | ICD-10-CM | POA: Diagnosis not present

## 2015-09-22 DIAGNOSIS — R238 Other skin changes: Secondary | ICD-10-CM

## 2015-09-22 DIAGNOSIS — L859 Epidermal thickening, unspecified: Secondary | ICD-10-CM

## 2015-09-22 DIAGNOSIS — I10 Essential (primary) hypertension: Secondary | ICD-10-CM

## 2015-09-22 DIAGNOSIS — E785 Hyperlipidemia, unspecified: Secondary | ICD-10-CM

## 2015-09-22 MED ORDER — CLOBETASOL PROPIONATE 0.05 % EX GEL: 1.0000 "application " | Freq: Every day | CUTANEOUS | Status: DC | PRN

## 2015-09-22 MED ORDER — MELOXICAM 7.5 MG PO TABS: 7.5000 mg | ORAL_TABLET | Freq: Every day | ORAL | Status: DC | PRN

## 2015-09-22 NOTE — Progress Notes (Signed)
Pre visit review using our clinic review tool, if applicable. No additional management support is needed unless otherwise documented below in the visit note. 

## 2015-09-22 NOTE — Assessment & Plan Note (Signed)
Stable on current meds 

## 2015-09-22 NOTE — Patient Instructions (Addendum)
Start Temovate Gel once daily as needed for scalp itching.

## 2015-09-22 NOTE — Progress Notes (Signed)
Subjective:    Patient ID: Robert Bates, male    DOB: July 15, 1941, 74 y.o.   MRN: [id number]  HPI  Robert Bates is a 74 yr old male who presents today for follow up.   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  Started radiation therapy last week.  Continues to work.  Reports that he has nearly quit smoking.  Suspicious lesion in his spine was biopsied and was negative for malignancy.  He met with oncology and thoracic surgery. Pneumonectomy was discussed, however pt has opted for radiation instead which he began this week. Reports that he is feeling well. Denies fatigue at this time.   HTN- Not currently on bp meds.  BP Readings from Last 3 Encounters:  09/22/15 131/75  09/19/15 97/81  09/06/15 119/65   Hyperlipidemia-  He is maintained on pravastatin Lab Results  Component Value Date   CHOL 145 06/19/2015   HDL 46.90 06/19/2015   LDLCALC 62 06/19/2015   LDLDIRECT 142.2 12/26/2008   TRIG 180.0* 06/19/2015   CHOLHDL 3 06/19/2015   He complains of dry scalp. Denies dandruff. Has tried t gel and selsun blue without improvement. Does not shampoo his hair every day.   Review of Systems  See HP  Past Medical History  Diagnosis Date  . GERD (gastroesophageal reflux disease)   . Alcoholism ([place])     past problem per pt but admits to still drinking - unclear how much  . Tobacco abuse   . Hematuria     refuses work up or referral - understands risks of morbidity / mortality - 11/2008, 12/2008  . Hyperlipemia   . Kidney stones   . Meningioma ([place]) 10/25/2013    Follows with Dr. [name].   . Peripheral vascular disease ([place])     Abdominal Aortic Aneursym  . Pneumonia     as a child  . Anxiety   . History of hiatal hernia   . Kidney cyst, acquired   . Arthritis   . Cancer [facility]) 2016    lung    Social History   Social History  . Marital Status: Married      Spouse Name: N/A  . Number of Children: 2  . Years of Education: N/A   Occupational History  . Retired     Administrator  .     Social History Main Topics  . Smoking status: Former Smoker -- 1.00 packs/day for 60 years    Quit date: 08/08/2015  . Smokeless tobacco: Former Systems developer    Types: Chew    Quit date: 11/04/1958     Comment: Started smoking at age 55.  Currently smoking 1 ppd.  . Alcohol Use: 0.0 oz/week    0 Standard drinks or equivalent per week     Comment: Pt reports rare use  . Drug Use: No  . Sexual Activity: Not on file   Other Topics Concern  . Not on file   Social History Narrative    Past Surgical History  Procedure Laterality Date  . Tonsillectomy    . Hernia repair    . Video bronchoscopy Bilateral 07/26/2015    Procedure: VIDEO BRONCHOSCOPY WITH FLUORO;  Surgeon: [name], MD;  Location: [facility];  Service: Cardiopulmonary;  Laterality: Bilateral;  . Eye surgery Bilateral     Cataracts removed w/ lens implant  . Colonoscopy    .  Video bronchoscopy with endobronchial ultrasound N/A 08/23/2015    Procedure: VIDEO BRONCHOSCOPY WITH ENDOBRONCHIAL ULTRASOUND;  Surgeon: Grace Isaac, MD;  Location: [facility];  Service: Thoracic;  Laterality: N/A;  . Video bronchoscopy with endobronchial navigation N/A 08/23/2015    Procedure: VIDEO BRONCHOSCOPY WITH ENDOBRONCHIAL NAVIGATION;  Surgeon: Grace Isaac, MD;  Location: [facility] OR;  Service: Thoracic;  Laterality: N/A;    Family History  Problem Relation Age of Onset  . Leukemia Father   . Learning disabilities Son   . Leukemia Other   . Stroke Other   . Emphysema Father     Allergies  Allergen Reactions  . Iodine     REACTION: neck swells    Current Outpatient Prescriptions on File Prior to Visit  Medication Sig Dispense Refill  . aspirin EC 81 MG tablet Take 81 mg by mouth daily.    [name] omeprazole (PRILOSEC) 40 MG capsule Take 1 capsule (40 mg total) by mouth daily. 90 capsule 1  .  pravastatin (PRAVACHOL) 80 MG tablet Take 1 tablet (80 mg total) by mouth daily. 90 tablet 1  . Wound Cleansers (RADIAPLEX EX) Apply topically.     No current facility-administered medications on file prior to visit.    BP 131/75 mmHg  Pulse 59  Temp(Src) 97.9 F (36.6 C) (Oral)  Resp 16  Ht $R'5\' 9"'vo$  (1.753 m)  Wt 174 lb 3.2 oz (79.017 kg)  BMI 25.71 kg/m2  SpO2 98%        Objective:   Physical Exam  Constitutional: He is oriented to person, place, and time. He appears well-developed and well-nourished. No distress.  HENT:  Head: Normocephalic and atraumatic.  Cardiovascular: Normal rate and regular rhythm.   No murmur heard. Pulmonary/Chest: Effort normal and breath sounds normal. No respiratory distress. He has no wheezes. He has no rales.  Musculoskeletal: He exhibits no edema.  Neurological: He is alert and oriented to person, place, and time.  Skin: Skin is warm and dry.  Psychiatric: He has a normal mood and affect. His behavior is normal. Thought content normal.          Assessment & Plan:  Dry scalp- trial of temovate prn.

## 2015-09-22 NOTE — Assessment & Plan Note (Signed)
LDL at goal.  Continue statin.   

## 2015-09-22 NOTE — Assessment & Plan Note (Addendum)
Management per oncology.  Discussed smoking cessation.

## 2015-09-24 ENCOUNTER — Ambulatory Visit
Admission: RE | Admit: 2015-09-24 | Discharge: 2015-09-24 | Disposition: A | Discharge status: Home / Self Care | Payer: Medicare Other | Source: Ambulatory Visit | Attending: Radiation Oncology | Admitting: Radiation Oncology

## 2015-09-24 DIAGNOSIS — Z510 Encounter for antineoplastic radiation therapy: Secondary | ICD-10-CM | POA: Diagnosis not present

## 2015-09-25 ENCOUNTER — Ambulatory Visit
Admission: RE | Admit: 2015-09-25 | Discharge: 2015-09-25 | Disposition: A | Discharge status: Home / Self Care | Payer: Medicare Other | Source: Ambulatory Visit | Attending: Radiation Oncology | Admitting: Radiation Oncology

## 2015-09-25 DIAGNOSIS — Z510 Encounter for antineoplastic radiation therapy: Secondary | ICD-10-CM | POA: Diagnosis not present

## 2015-09-26 ENCOUNTER — Encounter: Payer: Self-pay | Admitting: Radiation Oncology

## 2015-09-26 ENCOUNTER — Ambulatory Visit
Admission: RE | Admit: 2015-09-26 | Discharge: 2015-09-26 | Disposition: A | Discharge status: Home / Self Care | Payer: Medicare Other | Source: Ambulatory Visit | Attending: Radiation Oncology | Admitting: Radiation Oncology

## 2015-09-26 VITALS — BP 126/77 | HR 62 | Temp 97.5°F | Resp 16 | Ht 69.0 in | Wt 177.5 lb

## 2015-09-26 DIAGNOSIS — Z510 Encounter for antineoplastic radiation therapy: Secondary | ICD-10-CM | POA: Diagnosis not present

## 2015-09-26 DIAGNOSIS — C3432 Malignant neoplasm of lower lobe, left bronchus or lung: Secondary | ICD-10-CM

## 2015-09-26 NOTE — Progress Notes (Signed)
Robert Bates has received 8 fractions to his left lung.  He denies any pain at this time, nor SOB.

## 2015-09-26 NOTE — Progress Notes (Signed)
Weekly Management Note Current Dose: 20.8  Gy  Projected Dose:70.2  Gy   Narrative:  The patient presents for routine under treatment assessment.  CBCT/MVCT images/Port film x-rays were reviewed.  The chart was checked. Doing well. No complaints.   Physical Findings: Weight: 177 lb 8 oz (80.513 kg). Unchanged  Impression:  The patient is tolerating radiation.  Plan:  Continue treatment as planned.

## 2015-09-27 ENCOUNTER — Ambulatory Visit
Admission: RE | Admit: 2015-09-27 | Discharge: 2015-09-27 | Disposition: A | Discharge status: Home / Self Care | Payer: Medicare Other | Source: Ambulatory Visit | Attending: Radiation Oncology | Admitting: Radiation Oncology

## 2015-09-27 DIAGNOSIS — Z510 Encounter for antineoplastic radiation therapy: Secondary | ICD-10-CM | POA: Diagnosis not present

## 2015-10-02 ENCOUNTER — Ambulatory Visit
Admission: RE | Admit: 2015-10-02 | Discharge: 2015-10-02 | Disposition: A | Discharge status: Home / Self Care | Payer: Medicare Other | Source: Ambulatory Visit | Attending: Radiation Oncology | Admitting: Radiation Oncology

## 2015-10-02 DIAGNOSIS — Z510 Encounter for antineoplastic radiation therapy: Secondary | ICD-10-CM | POA: Diagnosis not present

## 2015-10-03 ENCOUNTER — Ambulatory Visit
Admission: RE | Admit: 2015-10-03 | Discharge: 2015-10-03 | Disposition: A | Discharge status: Home / Self Care | Payer: Medicare Other | Source: Ambulatory Visit | Attending: Radiation Oncology | Admitting: Radiation Oncology

## 2015-10-03 DIAGNOSIS — Z510 Encounter for antineoplastic radiation therapy: Secondary | ICD-10-CM | POA: Diagnosis not present

## 2015-10-03 DIAGNOSIS — C3482 Malignant neoplasm of overlapping sites of left bronchus and lung: Secondary | ICD-10-CM

## 2015-10-03 NOTE — Progress Notes (Signed)
Weekly Management Note Current Dose: 20.8  Gy  Projected Dose:70.2  Gy   Narrative:  The patient presents for routine under treatment assessment.  CBCT/MVCT images/Port film x-rays were reviewed.  The chart was checked. "Weird" feeling in frontal region. "I'm sleepy but I'm not sleepy." No headaches or loss of consciousness. Only at night. Still driving at night. Does not want to quit. No fevers or chills. Last 3 days. MRI negative in October.   Physical Findings: Weight:  . Unchanged. No skin changes over left chest wall.   Impression:  The patient is tolerating radiation.  Plan:  Continue treatment as planned.

## 2015-10-04 ENCOUNTER — Ambulatory Visit
Admission: RE | Admit: 2015-10-04 | Discharge: 2015-10-04 | Disposition: A | Discharge status: Home / Self Care | Payer: Medicare Other | Source: Ambulatory Visit | Attending: Radiation Oncology | Admitting: Radiation Oncology

## 2015-10-04 DIAGNOSIS — Z510 Encounter for antineoplastic radiation therapy: Secondary | ICD-10-CM | POA: Diagnosis not present

## 2015-10-05 ENCOUNTER — Ambulatory Visit
Admission: RE | Admit: 2015-10-05 | Discharge: 2015-10-05 | Disposition: A | Discharge status: Home / Self Care | Payer: Medicare Other | Source: Ambulatory Visit | Attending: Radiation Oncology | Admitting: Radiation Oncology

## 2015-10-05 DIAGNOSIS — Z510 Encounter for antineoplastic radiation therapy: Secondary | ICD-10-CM | POA: Diagnosis not present

## 2015-10-06 ENCOUNTER — Telehealth: Payer: Self-pay | Admitting: *Deleted

## 2015-10-06 ENCOUNTER — Ambulatory Visit
Admission: RE | Admit: 2015-10-06 | Discharge: 2015-10-06 | Disposition: A | Discharge status: Home / Self Care | Payer: Medicare Other | Source: Ambulatory Visit | Attending: Radiation Oncology | Admitting: Radiation Oncology

## 2015-10-06 DIAGNOSIS — Z510 Encounter for antineoplastic radiation therapy: Secondary | ICD-10-CM | POA: Diagnosis not present

## 2015-10-06 NOTE — Telephone Encounter (Signed)
Oncology Nurse Navigator Documentation  Oncology Nurse Navigator Flowsheets 10/06/2015  Navigator Encounter Type Telephone/called to check on patient to see how he was doing.  I also called to check on his smoking cessation.  I left vm message with my name and phone number to call.   Patient Visit Type Follow-up  Treatment Phase Treatment  Barriers/Navigation Needs Education  Education Smoking cessation  Interventions -  Coordination of Care -  Time Spent with Patient 15

## 2015-10-09 ENCOUNTER — Ambulatory Visit
Admission: RE | Admit: 2015-10-09 | Discharge: 2015-10-09 | Disposition: A | Discharge status: Home / Self Care | Payer: Medicare Other | Source: Ambulatory Visit | Attending: Radiation Oncology | Admitting: Radiation Oncology

## 2015-10-09 DIAGNOSIS — Z510 Encounter for antineoplastic radiation therapy: Secondary | ICD-10-CM | POA: Diagnosis not present

## 2015-10-10 ENCOUNTER — Ambulatory Visit
Admission: RE | Admit: 2015-10-10 | Discharge: 2015-10-10 | Disposition: A | Discharge status: Home / Self Care | Payer: Medicare Other | Source: Ambulatory Visit | Attending: Radiation Oncology | Admitting: Radiation Oncology

## 2015-10-10 ENCOUNTER — Encounter: Payer: Self-pay | Admitting: Radiation Oncology

## 2015-10-10 VITALS — BP 120/72 | HR 71 | Resp 16 | Wt 179.5 lb

## 2015-10-10 DIAGNOSIS — Z510 Encounter for antineoplastic radiation therapy: Secondary | ICD-10-CM | POA: Diagnosis not present

## 2015-10-10 DIAGNOSIS — C3432 Malignant neoplasm of lower lobe, left bronchus or lung: Secondary | ICD-10-CM

## 2015-10-10 NOTE — Progress Notes (Signed)
Weight and vitals stable. Patient denies pain. Denies skin changes. Reports he was using is radiaplex as directed but, forgot to these last to days. Stressed the importance of radiaplex. Patient confirms he will resume radiaplex. Reports an intermittent dry cough. Denies SOB or difficulty swallowing. Denies fatigue.   BP 120/72 mmHg  Pulse 71  Resp 16  Wt 179 lb 8 oz (81.421 kg)  SpO2 97% Wt Readings from Last 3 Encounters:  10/10/15 179 lb 8 oz (81.421 kg)  09/26/15 177 lb 8 oz (80.513 kg)  09/22/15 174 lb 3.2 oz (79.017 kg)

## 2015-10-10 NOTE — Progress Notes (Signed)
Weekly Management Note Current Dose:41.6  Gy  Projected Dose:70.2  Gy   Narrative:  The patient presents for routine under treatment assessment.  CBCT/MVCT images/Port film x-rays were reviewed.  The chart was checked. Feeling well. No complaints.  Physical Findings: Weight: 179 lb 8 oz (81.421 kg). Unchanged. No skin changes over left chest wall.   Impression:  The patient is tolerating radiation.  Plan:  Continue treatment as planned.

## 2015-10-11 ENCOUNTER — Ambulatory Visit
Admission: RE | Admit: 2015-10-11 | Discharge: 2015-10-11 | Disposition: A | Discharge status: Home / Self Care | Payer: Medicare Other | Source: Ambulatory Visit | Attending: Radiation Oncology | Admitting: Radiation Oncology

## 2015-10-11 DIAGNOSIS — Z510 Encounter for antineoplastic radiation therapy: Secondary | ICD-10-CM | POA: Diagnosis not present

## 2015-10-12 ENCOUNTER — Ambulatory Visit
Admission: RE | Admit: 2015-10-12 | Discharge: 2015-10-12 | Disposition: A | Discharge status: Home / Self Care | Payer: Medicare Other | Source: Ambulatory Visit | Attending: Radiation Oncology | Admitting: Radiation Oncology

## 2015-10-12 DIAGNOSIS — Z510 Encounter for antineoplastic radiation therapy: Secondary | ICD-10-CM | POA: Diagnosis not present

## 2015-10-13 ENCOUNTER — Ambulatory Visit
Admission: RE | Admit: 2015-10-13 | Discharge: 2015-10-13 | Disposition: A | Discharge status: Home / Self Care | Payer: Medicare Other | Source: Ambulatory Visit | Attending: Radiation Oncology | Admitting: Radiation Oncology

## 2015-10-13 DIAGNOSIS — Z510 Encounter for antineoplastic radiation therapy: Secondary | ICD-10-CM | POA: Diagnosis not present

## 2015-10-16 ENCOUNTER — Ambulatory Visit: Payer: Medicare Other | Admitting: Radiation Oncology

## 2015-10-16 ENCOUNTER — Ambulatory Visit
Admission: RE | Admit: 2015-10-16 | Discharge: 2015-10-16 | Disposition: A | Discharge status: Home / Self Care | Payer: Medicare Other | Source: Ambulatory Visit | Attending: Radiation Oncology | Admitting: Radiation Oncology

## 2015-10-16 DIAGNOSIS — Z510 Encounter for antineoplastic radiation therapy: Secondary | ICD-10-CM | POA: Diagnosis not present

## 2015-10-17 ENCOUNTER — Encounter: Payer: Self-pay | Admitting: Radiation Oncology

## 2015-10-17 ENCOUNTER — Ambulatory Visit
Admission: RE | Admit: 2015-10-17 | Discharge: 2015-10-17 | Disposition: A | Discharge status: Home / Self Care | Payer: Medicare Other | Source: Ambulatory Visit | Attending: Radiation Oncology | Admitting: Radiation Oncology

## 2015-10-17 VITALS — BP 129/74 | HR 61 | Temp 97.8°F | Wt 178.1 lb

## 2015-10-17 DIAGNOSIS — C3432 Malignant neoplasm of lower lobe, left bronchus or lung: Secondary | ICD-10-CM

## 2015-10-17 DIAGNOSIS — Z510 Encounter for antineoplastic radiation therapy: Secondary | ICD-10-CM | POA: Diagnosis not present

## 2015-10-17 NOTE — Progress Notes (Signed)
Weekly Management Note Current Dose:54.6  Gy  Projected Dose:70.2  Gy   Narrative:  The patient presents for routine under treatment assessment.  CBCT/MVCT images/Port film x-rays were reviewed.  The chart was checked. Feeling well. No complaints.  Physical Findings: Weight: 178 lb 1.6 oz (80.786 kg). Unchanged. No skin changes over left chest wall.   Impression:  The patient is tolerating radiation.  Plan:  Continue treatment as planned.

## 2015-10-17 NOTE — Progress Notes (Signed)
Robert Bates has received 21 fractions.  Reports that his pain level is 4 today left chest.  States he has a good energy level and his appetite is good.  Skin looks good to left chest color normal pink. Denies any SOB  BP 129/74 mmHg  Pulse 61  Temp(Src) 97.8 F (36.6 C) (Oral)  Wt 178 lb 1.6 oz (80.786 kg)  SpO2 97%  Wt Readings from Last 3 Encounters:  10/17/15 178 lb 1.6 oz (80.786 kg)  10/10/15 179 lb 8 oz (81.421 kg)  09/26/15 177 lb 8 oz (80.513 kg)

## 2015-10-18 ENCOUNTER — Ambulatory Visit
Admission: RE | Admit: 2015-10-18 | Discharge: 2015-10-18 | Disposition: A | Discharge status: Home / Self Care | Payer: Medicare Other | Source: Ambulatory Visit | Attending: Radiation Oncology | Admitting: Radiation Oncology

## 2015-10-18 DIAGNOSIS — Z510 Encounter for antineoplastic radiation therapy: Secondary | ICD-10-CM | POA: Diagnosis not present

## 2015-10-19 ENCOUNTER — Ambulatory Visit
Admission: RE | Admit: 2015-10-19 | Discharge: 2015-10-19 | Disposition: A | Discharge status: Home / Self Care | Payer: Medicare Other | Source: Ambulatory Visit | Attending: Radiation Oncology | Admitting: Radiation Oncology

## 2015-10-19 DIAGNOSIS — Z510 Encounter for antineoplastic radiation therapy: Secondary | ICD-10-CM | POA: Diagnosis not present

## 2015-10-20 ENCOUNTER — Ambulatory Visit
Admission: RE | Admit: 2015-10-20 | Discharge: 2015-10-20 | Disposition: A | Discharge status: Home / Self Care | Payer: Medicare Other | Source: Ambulatory Visit | Attending: Radiation Oncology | Admitting: Radiation Oncology

## 2015-10-20 DIAGNOSIS — Z510 Encounter for antineoplastic radiation therapy: Secondary | ICD-10-CM | POA: Diagnosis not present

## 2015-10-23 ENCOUNTER — Ambulatory Visit
Admission: RE | Admit: 2015-10-23 | Discharge: 2015-10-23 | Disposition: A | Discharge status: Home / Self Care | Payer: Medicare Other | Source: Ambulatory Visit | Attending: Radiation Oncology | Admitting: Radiation Oncology

## 2015-10-23 DIAGNOSIS — Z510 Encounter for antineoplastic radiation therapy: Secondary | ICD-10-CM | POA: Diagnosis not present

## 2015-10-24 ENCOUNTER — Ambulatory Visit
Admission: RE | Admit: 2015-10-24 | Discharge: 2015-10-24 | Disposition: A | Discharge status: Home / Self Care | Payer: Medicare Other | Source: Ambulatory Visit | Attending: Radiation Oncology | Admitting: Radiation Oncology

## 2015-10-24 ENCOUNTER — Encounter: Payer: Self-pay | Admitting: Radiation Oncology

## 2015-10-24 VITALS — BP 121/73 | HR 66 | Temp 98.6°F | Resp 20 | Wt 180.4 lb

## 2015-10-24 DIAGNOSIS — C3432 Malignant neoplasm of lower lobe, left bronchus or lung: Secondary | ICD-10-CM

## 2015-10-24 DIAGNOSIS — Z510 Encounter for antineoplastic radiation therapy: Secondary | ICD-10-CM | POA: Diagnosis not present

## 2015-10-24 NOTE — Progress Notes (Signed)
  Radiation Oncology         [phone] ________________________________  Name: Robert Bates MRN: [id number]  Date: 10/24/2015  DOB: December 21, 1940  Weekly Radiation Therapy Management    ICD-9-CM ICD-10-CM   1. 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) 162.5 C34.32      Current Dose: 67.6 Gy     Planned Dose:  70.2 Gy  Narrative . . . . . . . . The patient presents for routine under treatment assessment.                                   The patient is without complaint. No significant fatigue cough or breathing problems area and no esophagitis symptoms                                 Set-up films were reviewed.                                 The chart was checked. Physical Findings. . .  weight is 180 lb 6.4 oz (81.829 kg). His oral temperature is 98.6 F (37 C). His blood pressure is 121/73 and his pulse is 66. His respiration is 20 and oxygen saturation is 98%. . Weight essentially stable.  No significant changes. The lungs are clear. The heart has a regular rhythm and rate. Impression . . . . . . . The patient is tolerating radiation. Plan . . . . . . . . . . . . Continue treatment as planned.  ________________________________   Blair Promise, PhD, MD

## 2015-10-24 NOTE — Progress Notes (Addendum)
Weekly rad tx left lung, 26/27 txs, no skin changes on left chest ,, used radiaplex daily,  No pain no nausea, appetite good,    No coughing, energy level  Good  1 month f/u appt  Card given BP 121/73 mmHg  Pulse 66  Temp(Src) 98.6 F (37 C) (Oral)  Resp 20  Wt 180 lb 6.4 oz (81.829 kg)  SpO2 98%  Wt Readings from Last 3 Encounters:  10/24/15 180 lb 6.4 oz (81.829 kg)  10/17/15 178 lb 1.6 oz (80.786 kg)  10/10/15 179 lb 8 oz (81.421 kg)

## 2015-10-25 ENCOUNTER — Encounter: Payer: Self-pay | Admitting: Radiation Oncology

## 2015-10-25 ENCOUNTER — Ambulatory Visit: Payer: Medicare Other

## 2015-10-25 ENCOUNTER — Ambulatory Visit
Admission: RE | Admit: 2015-10-25 | Discharge: 2015-10-25 | Disposition: A | Discharge status: Home / Self Care | Payer: Medicare Other | Source: Ambulatory Visit | Attending: Radiation Oncology | Admitting: Radiation Oncology

## 2015-10-25 DIAGNOSIS — Z510 Encounter for antineoplastic radiation therapy: Secondary | ICD-10-CM | POA: Diagnosis not present

## 2015-10-26 ENCOUNTER — Ambulatory Visit: Payer: Medicare Other

## 2015-11-02 NOTE — Progress Notes (Signed)
  Radiation Oncology         [phone] ________________________________  Name: Robert Bates MRN: [id number]  Date: 10/25/2015  DOB: 09-Sep-1941  End of Treatment Note  Diagnosis:   Lung cancer [facility])   Staging form: Lung, AJCC 7th Edition     Clinical stage from 08/18/2015: Stage IIB (T3, N0, M0) - Signed by Curt Bears, MD on 08/18/2015  Indication for treatment:  Curative       Radiation treatment dates:   09/18/2015-10/25/2015  Site/dose:   Left lower lobe / 70.2 Gy at 2.6 Gy per fraction x 27 fractions  Beams/energy:   3D conformal DCA with daily cone beam CT guidance and 10 and 6 MV photons  Narrative: The patient tolerated radiation treatment relatively well.   He had some fatigue but no skin changes or respiratory complaints.   Plan: The patient has completed radiation treatment. The patient will return to radiation oncology clinic for routine followup in one month. I advised them to call or return sooner if they have any questions or concerns related to their recovery or treatment.  ------------------------------------------------  Thea Silversmith, MD

## 2015-11-10 ENCOUNTER — Other Ambulatory Visit: Payer: Self-pay | Admitting: Family

## 2015-11-10 MED FILL — MELOXICAM 7.5 MG TABLET: 7.5 | 30 days supply | Qty: 30 | Fill #1

## 2015-11-30 ENCOUNTER — Ambulatory Visit
Admission: RE | Admit: 2015-11-30 | Discharge: 2015-11-30 | Disposition: A | Discharge status: Home / Self Care | Payer: Medicare Other | Source: Ambulatory Visit | Attending: Radiation Oncology | Admitting: Radiation Oncology

## 2015-11-30 ENCOUNTER — Encounter: Payer: Self-pay | Admitting: Radiation Oncology

## 2015-11-30 VITALS — BP 128/77 | HR 69 | Temp 98.7°F | Ht 69.0 in | Wt 180.5 lb

## 2015-11-30 DIAGNOSIS — C3432 Malignant neoplasm of lower lobe, left bronchus or lung: Secondary | ICD-10-CM

## 2015-11-30 NOTE — Progress Notes (Signed)
   Department of Radiation Oncology  Phone:  [phone] Fax:        [phone]   Name: Robert Bates MRN: [id number]  DOB: Jul 08, 1941  Date: 11/30/2015  Follow Up Visit Note  Diagnosis: Lung cancer [facility])   Staging form: Lung, AJCC 7th Edition     Clinical stage from 08/18/2015: Stage IIB (T3, N0, M0) - Signed by Curt Bears, MD on 08/18/2015  Summary and Interval since last radiation: 1 month  09/18/2015-10/25/2015: Left lower lobe / 70.2 Gy at 2.6 Gy per fraction x 27 fractions  Interval History: Robert Bates presents today for routine followup. He reports that when he lies on his left side, he wheezes. He is not SOB, but reports a "crackling sound" for the past 3 weeks when he lies on his left side. He reports a productive cough with yellow sputum which is normal for him. He denies, fevers, chills, or pain. He is still driving full time back and forth to [place].   Physical Exam:  Filed Vitals:   11/30/15 1602  BP: 128/77  Pulse: 69  Temp: 98.7 F (37.1 C)  Height: '5\' 9"'$  (1.753 m)  Weight: 180 lb 8 oz (81.874 kg)  SpO2: 97%  Some redness in his mid-back. Well healed at this time.  IMPRESSION: Robert Bates is a 75 y.o. male with stage IIB left lung cancer resolving from the acute effects of radiation.  PLAN: The patient will have a CT without contrast in 1 week and then schedule a f/u with myself and a CT without contrast in 4 months.  Thea Silversmith, MD  This document serves as a record of services personally performed by Thea Silversmith, MD. It was created on her behalf by Darcus Austin, a trained medical scribe. The creation of this record is based on the scribe's personal observations and the provider's statements to them. This document has been checked and approved by the attending provider.

## 2015-11-30 NOTE — Progress Notes (Addendum)
When he lies on his left side he wheezes, but is not short of breath with "crackling sound" for the past 3 weeks. Productive cough of yellow sputum.  Denies any chills nor fever.  Seen by [name], PA 1 month ago.   VSS.  Skin Satus: Redness -Mid back, no rash Using Biafine Med/ONc :Seen by Dr. Julien Nordmann in October REspiratory: Wheezes when lying of left side, "crackling sound" Headache: No Dizziness/Vision Alterations: No Appetite: has gained 10 since October Pain: None Energy level: "Good"   BP 128/77 mmHg  Pulse 69  Temp(Src) 98.7 F (37.1 C)  Ht '5\' 9"'$  (1.753 m)  Wt 180 lb 8 oz (81.874 kg)  BMI 26.64 kg/m2  SpO2 97%  Wt Readings from Last 3 Encounters:  11/30/15 180 lb 8 oz (81.874 kg)  10/24/15 180 lb 6.4 oz (81.829 kg)  10/17/15 178 lb 1.6 oz (80.786 kg)

## 2015-12-01 ENCOUNTER — Telehealth: Payer: Self-pay | Admitting: *Deleted

## 2015-12-01 NOTE — Telephone Encounter (Signed)
CALLED PATIENT TO INFORM OF CT'S AND FU'S , LVM FOR A RETURN CALL

## 2015-12-08 ENCOUNTER — Ambulatory Visit (HOSPITAL_COMMUNITY)
Admission: RE | Admit: 2015-12-08 | Discharge: 2015-12-08 | Disposition: A | Discharge status: Home / Self Care | Payer: Medicare Other | Source: Ambulatory Visit | Attending: Radiation Oncology | Admitting: Radiation Oncology

## 2015-12-08 ENCOUNTER — Encounter (HOSPITAL_COMMUNITY): Payer: Self-pay

## 2015-12-08 DIAGNOSIS — C3432 Malignant neoplasm of lower lobe, left bronchus or lung: Secondary | ICD-10-CM | POA: Diagnosis not present

## 2015-12-08 DIAGNOSIS — K8020 Calculus of gallbladder without cholecystitis without obstruction: Secondary | ICD-10-CM | POA: Diagnosis not present

## 2015-12-08 DIAGNOSIS — N281 Cyst of kidney, acquired: Secondary | ICD-10-CM | POA: Insufficient documentation

## 2015-12-08 DIAGNOSIS — R911 Solitary pulmonary nodule: Secondary | ICD-10-CM | POA: Diagnosis not present

## 2015-12-11 ENCOUNTER — Ambulatory Visit (INDEPENDENT_AMBULATORY_CARE_PROVIDER_SITE_OTHER): Payer: Medicare Other | Admitting: Family

## 2015-12-11 ENCOUNTER — Encounter: Payer: Self-pay | Admitting: Family

## 2015-12-11 VITALS — BP 130/73 | HR 66 | Temp 97.7°F | Resp 18 | Ht 69.0 in | Wt 180.6 lb

## 2015-12-11 DIAGNOSIS — C3482 Malignant neoplasm of overlapping sites of left bronchus and lung: Secondary | ICD-10-CM | POA: Diagnosis not present

## 2015-12-11 DIAGNOSIS — R062 Wheezing: Secondary | ICD-10-CM | POA: Diagnosis not present

## 2015-12-11 MED ORDER — ALBUTEROL SULFATE HFA 108 (90 BASE) MCG/ACT IN AERS: 2.0000 | INHALATION_SPRAY | Freq: Four times a day (QID) | RESPIRATORY_TRACT | Status: DC | PRN

## 2015-12-11 NOTE — Patient Instructions (Addendum)
Try to add walking to your routine.  Try to make healthy food choices.  Try to add walking with goal of 30 minutes 5 days a week. Add albuterol (inhaler) 2 puffs every 6 hours as needed for cough/wheezing. Call if you develop worsening cough/wheezing/shortness of breath.

## 2015-12-11 NOTE — Progress Notes (Signed)
Pre visit review using our clinic review tool, if applicable. No additional management support is needed unless otherwise documented below in the visit note. 

## 2015-12-11 NOTE — Progress Notes (Signed)
Subjective:    Patient ID: Robert Bates, male    DOB: 1941-03-30, 75 y.o.   MRN: [id number]  HPI  Mr. Imhof is a 75 yr old male who presents today for follow up of multiple medical problems:  1) HTN-  Not currently on antihypertensive.   BP Readings from Last 3 Encounters:  12/11/15 130/73  11/30/15 128/77  10/24/15 121/73   2) Hyperlipidemia- Pt is maintianed on pravastatin.  Lab Results  Component Value Date   CHOL 145 06/19/2015   HDL 46.90 06/19/2015   LDLCALC 62 06/19/2015   LDLDIRECT 142.2 12/26/2008   TRIG 180.0* 06/19/2015   CHOLHDL 3 06/19/2015   3) Weight Gain- Pt reports that he has gained 15 pounds since he quit smoking 8/16.   Wt Readings from Last 3 Encounters:  12/11/15 180 lb 9.6 oz (81.92 kg)  11/30/15 180 lb 8 oz (81.874 kg)  10/24/15 180 lb 6.4 oz (81.829 kg)   4) Hx lung CA- reports + cough/wheezing x 3 weeks. Reports wheezing is worse if he lays on her left side.   Patient completed radiation therapy 10/25/15.  He underwent a follow up CT chest on 12/08/15 which noted marked interval decrease in size of the endobronchial lesion.  LUL lesion was slightly smaller in size.  R paratracheal lymph node was stable. The patient is scheduled to follow up with Dr. Pablo Ledger (radiation oncology) on 12/14/15.     Review of Systems See HPI  Past Medical History  Diagnosis Date  . GERD (gastroesophageal reflux disease)   . Alcoholism ([place])     past problem per pt but admits to still drinking - unclear how much  . Tobacco abuse   . Hematuria     refuses work up or referral - understands risks of morbidity / mortality - 11/2008, 12/2008  . Hyperlipemia   . Kidney stones   . Meningioma ([place]) 10/25/2013    Follows with Dr. [name].   . Peripheral vascular disease ([place])     Abdominal Aortic Aneursym  . Pneumonia     as a child  . Anxiety   . History of hiatal hernia   . Kidney cyst, acquired   . Arthritis   . 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    Social History   Social History  . Marital Status: Married    Spouse Name: N/A  . Number of Children: 2  . Years of Education: N/A   Occupational History  . Retired     Administrator  .     Social History Main Topics  . Smoking status: Former Smoker -- 1.00 packs/day for 10 years    Quit date: 08/08/2015  . Smokeless tobacco: Former Systems developer    Types: Chew    Quit date: 11/04/1958     Comment: Started smoking at age 87.  Currently smoking 1 ppd.  . Alcohol Use: 0.0 oz/week    0 Standard drinks or equivalent per week     Comment: Pt reports rare use  . Drug Use: No  . Sexual Activity: Not on file   Other Topics Concern  . Not on file   Social History Narrative    Past Surgical History  Procedure Laterality Date  . Tonsillectomy    . Hernia repair    . Video bronchoscopy Bilateral 07/26/2015    Procedure: VIDEO BRONCHOSCOPY WITH FLUORO;  Surgeon: [name], MD;  Location: [name];  Service: Cardiopulmonary;  Laterality: Bilateral;  . Eye surgery Bilateral     Cataracts removed w/ lens implant  . Colonoscopy    . Video bronchoscopy with endobronchial ultrasound N/A 08/23/2015    Procedure: VIDEO BRONCHOSCOPY WITH ENDOBRONCHIAL ULTRASOUND;  Surgeon: Grace Isaac, MD;  Location: [facility];  Service: Thoracic;  Laterality: N/A;  . Video bronchoscopy with endobronchial navigation N/A 08/23/2015    Procedure: VIDEO BRONCHOSCOPY WITH ENDOBRONCHIAL NAVIGATION;  Surgeon: Grace Isaac, MD;  Location: [facility] OR;  Service: Thoracic;  Laterality: N/A;    Family History  Problem Relation Age of Onset  . Leukemia Father   . Learning disabilities Son   . Leukemia Other   . Stroke Other   . Emphysema Father     Allergies  Allergen Reactions  . Iodine     REACTION: neck swells    Current Outpatient Prescriptions on File Prior to Visit  Medication Sig Dispense Refill  . aspirin  EC 81 MG tablet Take 81 mg by mouth daily.    . clobetasol (TEMOVATE) 0.05 % GEL Apply 1 application topically daily as needed. Scalp itching 30 each 1  . meloxicam (MOBIC) 7.5 MG tablet Take 1 tablet (7.5 mg total) by mouth daily as needed for pain. 30 tablet 2  . omeprazole (PRILOSEC) 40 MG capsule Take 1 capsule (40 mg total) by mouth daily. 90 capsule 1  . pravastatin (PRAVACHOL) 80 MG tablet Take 1 tablet (80 mg total) by mouth daily. 90 tablet 1   No current facility-administered medications on file prior to visit.    BP 130/73 mmHg  Pulse 66  Temp(Src) 97.7 F (36.5 C) (Oral)  Resp 18  Ht '5\' 9"'$  (1.753 m)  Wt 180 lb 9.6 oz (81.92 kg)  BMI 26.66 kg/m2  SpO2 95%       Objective:   Physical Exam  Constitutional: He is oriented to person, place, and time. He appears well-developed and well-nourished. No distress.  HENT:  Head: Normocephalic and atraumatic.  Cardiovascular: Normal rate and regular rhythm.   No murmur heard. Pulmonary/Chest: Effort normal. No respiratory distress. He has wheezes in the right upper field. He has no rales.  RUL wheeze clears with deep breath  Musculoskeletal: He exhibits no edema.  Neurological: He is alert and oriented to person, place, and time.  Skin: Skin is warm and dry.  Psychiatric: He has a normal mood and affect. His behavior is normal. Thought content normal.          Assessment & Plan:  Wheezing- suspect due to underlying COPD and recent radiation treatment.  No sign of acute illness, CT neg for pneumonia. Will add albuterol prn.  Pt is advised to let me know if he develops worsening cough or fever.  Weight gain- due to recent smoking cessation- we discussed adding healthy diet, exercise. Commended pt on quitting smoking.

## 2015-12-11 NOTE — Assessment & Plan Note (Signed)
Advised pt to keep upcoming apt with radiation oncology.

## 2015-12-12 NOTE — Progress Notes (Signed)
    Weight changes, if any:No  Respiratory complaints, if any:SOB at times with coughing O2 sat today 97 %, easy to get bronchitis has Albuterol  Hemoptysis, if any: No  Pain issues, if any:  No  Swallowing Problems/Pain/Difficulty swallowing:No Smoking:Tobacco/Marijuana/Snuff/ETOH use:  Quit 08-08-15 Chewing tobacco BP 129/76 mmHg  Pulse 69  Temp(Src) 98.4 F (36.9 C) (Oral)  Resp 18  Ht '5\' 9"'$  (1.753 m)  Wt 183 lb 1.6 oz (83.054 kg)  BMI 27.03 kg/m2  SpO2 97%

## 2015-12-14 ENCOUNTER — Ambulatory Visit
Admission: RE | Admit: 2015-12-14 | Discharge: 2015-12-14 | Disposition: A | Discharge status: Home / Self Care | Payer: Medicare Other | Source: Ambulatory Visit | Attending: Radiation Oncology | Admitting: Radiation Oncology

## 2015-12-14 ENCOUNTER — Encounter: Payer: Self-pay | Admitting: Radiation Oncology

## 2015-12-14 VITALS — BP 129/76 | HR 69 | Temp 98.4°F | Resp 18 | Ht 69.0 in | Wt 183.1 lb

## 2015-12-14 DIAGNOSIS — C3432 Malignant neoplasm of lower lobe, left bronchus or lung: Secondary | ICD-10-CM

## 2015-12-14 NOTE — Progress Notes (Signed)
   Department of Radiation Oncology  Phone:  [phone] Fax:        [phone]   Name: Robert Bates MRN: [id number]  DOB: [birth year]  Date: 12/14/2015  Follow Up Visit Note  Diagnosis: Lung cancer [facility])   Staging form: Lung, AJCC 7th Edition     Clinical stage from 08/18/2015: Stage IIB (T3, N0, M0) - Signed by Curt Bears, MD on 08/18/2015  Summary and Interval since last radiation: 1 1/2 months  09/18/2015-10/25/2015: Left lower lobe / 70.2 Gy at 2.6 Gy per fraction x 27 fractions   Interval History: Robert Bates presents today for follow up to get CT results from 12-08-15 which showed left lower lobe lesion had decreased in size. The left upper lobe lesion was smaller and the peritracheal node was stable. He denies smoking this time and complains of weight gain. He is present with his wife. He denies any worsening SOB symptoms. He is scheduled for CT scan in May.  Physical Exam:  Filed Vitals:   12/14/15 0846  BP: 129/76  Pulse: 69  Temp: 98.4 F (36.9 C)  TempSrc: Oral  Resp: 18  Height: '5\' 9"'$  (1.753 m)  Weight: 183 lb 1.6 oz (83.054 kg)  SpO2: 97%    He is alert and oriented and in no respiratory distress.  IMPRESSION: Robert Bates is a 75 y.o. male with stage IIB left lung cancer status post radiation with excellent imaging response.   PLAN: Follow up in 3 months with a CT scan.   Thea Silversmith, MD  This document serves as a record of services personally performed by Thea Silversmith, MD. It was created on her behalf by Jenell Milliner, a trained medical scribe. The creation of this record is based on the scribe's personal observations and the provider's statements to them. This document has been checked and approved by the attending provider.

## 2015-12-19 ENCOUNTER — Telehealth: Payer: Self-pay | Admitting: Medical

## 2015-12-19 NOTE — Telephone Encounter (Signed)
Pt did have ct of chest done. And other order to be done in May.

## 2015-12-25 ENCOUNTER — Ambulatory Visit: Payer: Medicare Other | Admitting: Family

## 2016-01-18 ENCOUNTER — Other Ambulatory Visit: Payer: Self-pay | Admitting: Family

## 2016-01-18 MED FILL — MELOXICAM 7.5 MG TABLET: 7.5 | 30 days supply | Qty: 30 | Fill #2

## 2016-01-19 MED FILL — OMEPRAZOLE DR 40 MG CAPSULE: 40 | 90 days supply | Qty: 90 | Fill #0

## 2016-01-19 MED FILL — PRAVASTATIN NA 80 MG TAB: 80 | 90 days supply | Qty: 90 | Fill #0

## 2016-03-08 ENCOUNTER — Ambulatory Visit (INDEPENDENT_AMBULATORY_CARE_PROVIDER_SITE_OTHER): Payer: Medicare Other | Admitting: Family

## 2016-03-08 ENCOUNTER — Encounter: Payer: Self-pay | Admitting: Family

## 2016-03-08 VITALS — BP 113/71 | HR 64 | Temp 97.8°F | Resp 16 | Ht 69.0 in | Wt 174.0 lb

## 2016-03-08 DIAGNOSIS — I10 Essential (primary) hypertension: Secondary | ICD-10-CM | POA: Diagnosis not present

## 2016-03-08 DIAGNOSIS — E785 Hyperlipidemia, unspecified: Secondary | ICD-10-CM | POA: Diagnosis not present

## 2016-03-08 DIAGNOSIS — C3482 Malignant neoplasm of overlapping sites of left bronchus and lung: Secondary | ICD-10-CM | POA: Diagnosis not present

## 2016-03-08 MED ORDER — ALBUTEROL SULFATE HFA 108 (90 BASE) MCG/ACT IN AERS: 2.0000 | INHALATION_SPRAY | Freq: Four times a day (QID) | RESPIRATORY_TRACT | Status: DC | PRN

## 2016-03-08 NOTE — Progress Notes (Signed)
Subjective:    Patient ID: Robert Bates, male    DOB: 1941/06/30, 75 y.o.   MRN: [id number]  HPI  Robert Bates is a 75 yr old male who presents today for follow up.  1) HTN- Pt is not currently on antihypertensive med. BP Readings from Last 3 Encounters:  03/08/16 113/71  12/14/15 129/76  12/11/15 130/73   2) Hyperlipidemia- on pravastatin- denies myalgia.  Lab Results  Component Value Date   CHOL 145 06/19/2015   HDL 46.90 06/19/2015   LDLCALC 62 06/19/2015   LDLDIRECT 142.2 12/26/2008   TRIG 180.0* 06/19/2015   CHOLHDL 3 06/19/2015   3) Lung CA- pt is s/p radiation therapy for lung cancer.  Continues to have wheezing which is worst if he lays on his left side.  He is being monitored by serial CT scans.  Wheezes only when he lays on the left side.    Low back pain- worse with prolonged sitting in his truck.    Review of Systems  Cardiovascular:       Denies current swelling   See HPI  Past Medical History  Diagnosis Date  . GERD (gastroesophageal reflux disease)   . Alcoholism ([place])     past problem per pt but admits to still drinking - unclear how much  . Tobacco abuse   . Hematuria     refuses work up or referral - understands risks of morbidity / mortality - 11/2008, 12/2008  . Hyperlipemia   . Kidney stones   . Meningioma ([place]) 10/25/2013    Follows with Dr. [name].   . Peripheral vascular disease ([place])     Abdominal Aortic Aneursym  . Pneumonia     as a child  . Anxiety   . History of hiatal hernia   . Kidney cyst, acquired   . Arthritis   . 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     Social History   Social History  . Marital Status: Married    Spouse Name: N/A  . Number of Children: 2  . Years of Education: N/A   Occupational History  . Retired     Administrator  .     Social History Main Topics  . Smoking status: Former Smoker -- 1.00 packs/day for  54 years    Quit date: 08/08/2015  . Smokeless tobacco: Former Systems developer    Types: Chew    Quit date: 11/04/1958     Comment: Started smoking at age 58.  Currently smoking 1 ppd.  . Alcohol Use: 0.0 oz/week    0 Standard drinks or equivalent per week     Comment: Pt reports rare use  . Drug Use: No  . Sexual Activity: Not on file   Other Topics Concern  . Not on file   Social History Narrative    Past Surgical History  Procedure Laterality Date  . Tonsillectomy    . Hernia repair    . Video bronchoscopy Bilateral 07/26/2015    Procedure: VIDEO BRONCHOSCOPY WITH FLUORO;  Surgeon: [name], MD;  Location: [facility];  Service: Cardiopulmonary;  Laterality: Bilateral;  . Eye surgery Bilateral     Cataracts removed w/ lens implant  . Colonoscopy    . Video bronchoscopy with endobronchial ultrasound N/A 08/23/2015    Procedure: VIDEO BRONCHOSCOPY WITH ENDOBRONCHIAL ULTRASOUND;  Surgeon: Grace Isaac, MD;  [facility]  OR;  Service: Thoracic;  Laterality: N/A;  . Video bronchoscopy with endobronchial navigation N/A 08/23/2015    Procedure: VIDEO BRONCHOSCOPY WITH ENDOBRONCHIAL NAVIGATION;  Surgeon: Grace Isaac, MD;  Location: [facility] OR;  Service: Thoracic;  Laterality: N/A;    Family History  Problem Relation Age of Onset  . Leukemia Father   . Learning disabilities Son   . Leukemia Other   . Stroke Other   . Emphysema Father     Allergies  Allergen Reactions  . Iodine     REACTION: neck swells    Current Outpatient Prescriptions on File Prior to Visit  Medication Sig Dispense Refill  . aspirin EC 81 MG tablet Take 81 mg by mouth daily.    . clobetasol (TEMOVATE) 0.05 % GEL Apply 1 application topically daily as needed. Scalp itching 30 each 1  . meloxicam (MOBIC) 7.5 MG tablet Take 1 tablet (7.5 mg total) by mouth daily as needed for pain. 30 tablet 2  . omeprazole (PRILOSEC) 40 MG capsule TAKE 1 CAPSULE (40 MG TOTAL) BY MOUTH DAILY. 90 capsule 1  .  pravastatin (PRAVACHOL) 80 MG tablet TAKE 1 TABLET (80 MG TOTAL) BY MOUTH DAILY. 90 tablet 1   No current facility-administered medications on file prior to visit.    BP 113/71 mmHg  Pulse 64  Temp(Src) 97.8 F (36.6 C) (Oral)  Resp 16  Ht '5\' 9"'$  (1.753 m)  Wt 174 lb (78.926 kg)  BMI 25.68 kg/m2  SpO2 94%       Objective:   Physical Exam  Constitutional: He is oriented to person, place, and time. He appears well-developed and well-nourished. No distress.  HENT:  Head: Normocephalic and atraumatic.  Cardiovascular: Normal rate and regular rhythm.   No murmur heard. Pulmonary/Chest: Effort normal. No respiratory distress. He has no rales.  Soft LUL expiratory wheeze  Musculoskeletal: He exhibits no edema.  Neurological: He is alert and oriented to person, place, and time.  Skin: Skin is warm and dry.  Psychiatric: He has a normal mood and affect. His behavior is normal. Thought content normal.          Assessment & Plan:

## 2016-03-08 NOTE — Assessment & Plan Note (Signed)
Tolerating statin. Continue same.

## 2016-03-08 NOTE — Progress Notes (Signed)
Pre visit review using our clinic review tool, if applicable. No additional management support is needed unless otherwise documented below in the visit note. 

## 2016-03-08 NOTE — Patient Instructions (Addendum)
Please contact Dr. Arvilla Market office to arrange a follow up appointment. [phone] Please schedule a wellness visit at the front desk.  Let me know if you decide to go forward with physical therapy. Pick up the inhaler and you may use every 6 hours as needed for wheezing or shortness of breath.

## 2016-03-08 NOTE — Assessment & Plan Note (Addendum)
S/p radiation. Advised pt to arrange follow up visit with Dr. Mckinley Jewel. His wheezing is likely related to radiation treatment. Advised pt to try using the albuterol inhaler as needed.

## 2016-03-08 NOTE — Assessment & Plan Note (Signed)
BP stable off of meds.  Monitor.

## 2016-03-10 ENCOUNTER — Telehealth: Payer: Self-pay | Admitting: Family

## 2016-03-10 NOTE — Telephone Encounter (Signed)
Please let pt know that I spoke to Dr. Mckinley Jewel and he does not need to see him back in the office at this time. He just recommends that he follow with Dr. Pablo Ledger.

## 2016-03-10 NOTE — Telephone Encounter (Signed)
-----   Message from Curt Bears, MD sent at 03/09/2016  1:55 PM EDT ----- Orlean Patten, Thank you for the message. He is currently followed by Radiation Oncology, Dr. Pablo Ledger. I will see him if he has any recurrence. Thank you. Mohamed ----- Message -----    From: [name], NP    Sent: 03/08/2016   1:38 PM      To: Curt Bears, MD  Dr. Julien Nordmann,  I see that he has not been back to see you since your initial consult, has only been seeing radiation oncologist.  I wanted to bring this to your attention in case you would like to see him back.  I have asked him to contact your office, but he is not the most reliable patient so I thought you may want one of your staff to reach out to him.   Thanks for your help,  [name] NP

## 2016-03-11 NOTE — Telephone Encounter (Signed)
Notified pt and he voices understanding. 

## 2016-03-13 ENCOUNTER — Ambulatory Visit (HOSPITAL_COMMUNITY): Payer: Medicare Other

## 2016-03-14 ENCOUNTER — Ambulatory Visit
Admission: RE | Admit: 2016-03-14 | Discharge: 2016-03-14 | Disposition: A | Discharge status: Home / Self Care | Payer: Medicare Other | Source: Ambulatory Visit | Attending: Radiation Oncology | Admitting: Radiation Oncology

## 2016-04-09 MED FILL — OMEPRAZOLE DR 40 MG CAPSULE: 40 | 90 days supply | Qty: 90 | Fill #1

## 2016-04-10 ENCOUNTER — Ambulatory Visit (HOSPITAL_COMMUNITY): Payer: Medicare Other

## 2016-04-12 ENCOUNTER — Telehealth: Payer: Self-pay | Admitting: *Deleted

## 2016-04-12 NOTE — Telephone Encounter (Signed)
Called patient to inform of Lab and CT for 05-22-16, and his fu with Dr. Pablo Ledger on 05-23-16 to get his results spoke with patient and he is aware of these results.

## 2016-04-12 NOTE — Telephone Encounter (Signed)
XXXX 

## 2016-04-17 ENCOUNTER — Ambulatory Visit (HOSPITAL_COMMUNITY): Payer: Medicare Other

## 2016-04-17 ENCOUNTER — Ambulatory Visit: Admission: RE | Admit: 2016-04-17 | Payer: Medicare Other | Source: Ambulatory Visit

## 2016-04-17 ENCOUNTER — Ambulatory Visit: Payer: Medicare Other

## 2016-04-17 ENCOUNTER — Ambulatory Visit: Payer: Medicare Other | Attending: Radiation Oncology

## 2016-04-18 ENCOUNTER — Ambulatory Visit
Admission: RE | Admit: 2016-04-18 | Discharge: 2016-04-18 | Disposition: A | Discharge status: Home / Self Care | Payer: Medicare Other | Source: Ambulatory Visit | Attending: Radiation Oncology | Admitting: Radiation Oncology

## 2016-04-22 ENCOUNTER — Ambulatory Visit (HOSPITAL_BASED_OUTPATIENT_CLINIC_OR_DEPARTMENT_OTHER)
Admission: RE | Admit: 2016-04-22 | Discharge: 2016-04-22 | Disposition: A | Discharge status: Home / Self Care | Payer: Medicare Other | Source: Ambulatory Visit | Attending: Physician Assistant | Admitting: Physician Assistant

## 2016-04-22 ENCOUNTER — Ambulatory Visit (INDEPENDENT_AMBULATORY_CARE_PROVIDER_SITE_OTHER): Payer: Medicare Other | Admitting: Physician Assistant

## 2016-04-22 ENCOUNTER — Encounter: Payer: Self-pay | Admitting: Physician Assistant

## 2016-04-22 VITALS — BP 110/58 | HR 78 | Temp 98.1°F | Resp 16 | Ht 69.0 in | Wt 174.2 lb

## 2016-04-22 DIAGNOSIS — R0989 Other specified symptoms and signs involving the circulatory and respiratory systems: Secondary | ICD-10-CM | POA: Insufficient documentation

## 2016-04-22 DIAGNOSIS — R0789 Other chest pain: Secondary | ICD-10-CM

## 2016-04-22 DIAGNOSIS — R918 Other nonspecific abnormal finding of lung field: Secondary | ICD-10-CM | POA: Diagnosis not present

## 2016-04-22 MED ORDER — AZITHROMYCIN 250 MG PO TABS: ORAL_TABLET | ORAL | Status: DC

## 2016-04-22 MED ORDER — ALBUTEROL SULFATE HFA 108 (90 BASE) MCG/ACT IN AERS: 1.0000 | INHALATION_SPRAY | Freq: Four times a day (QID) | RESPIRATORY_TRACT | Status: DC | PRN

## 2016-04-22 MED FILL — PROAIR HFA 90 MCG INHALER: 108 (90 BAS | 25 days supply | Qty: 9 | Fill #0

## 2016-04-22 MED FILL — AZITHROMYCIN 250 MG TABLET: 250 | 5 days supply | Qty: 6 | Fill #0

## 2016-04-22 NOTE — Patient Instructions (Signed)
Your EKG looks good today as do your vitals.  Please go downstairs for chest x-ray. We will call with results. Please increase fluids and take antibiotic as directed. Plain Mucinex for congestion. Use the albuterol inhaler as directed for wheeze.  I will call you tomorrow to check on you. Again although you are currently lower risk for clots, etc, if you develop any significant chest pain or worsening SOB, please go to the ER.

## 2016-04-22 NOTE — Progress Notes (Signed)
Patient presents to clinic today c/o  2 days of chest congestion with productive cough. Endorses symptoms starting last night. Woke up with worsening symptoms. Is concerned due to history of lung cancer. Denies fever but notes some mild chills. Notes chest tenderness with coughing and deep breaths. Has history of COPD, only with Rx for Albuterol. Has not been using. Notes mild wheeze during significant coughing spells. Denies recent travel or sick contact.  Past Medical History  Diagnosis Date  . GERD (gastroesophageal reflux disease)   . Alcoholism ([place])     past problem per pt but admits to still drinking - unclear how much  . Tobacco abuse   . Hematuria     refuses work up or referral - understands risks of morbidity / mortality - 11/2008, 12/2008  . Hyperlipemia   . Kidney stones   . Meningioma ([place]) 10/25/2013    Follows with Dr. [name].   . Peripheral vascular disease ([place])     Abdominal Aortic Aneursym  . Pneumonia     as a child  . Anxiety   . History of hiatal hernia   . Kidney cyst, acquired   . Arthritis   . 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    Current Outpatient Prescriptions on File Prior to Visit  Medication Sig Dispense Refill  . aspirin EC 81 MG tablet Take 81 mg by mouth daily.    . clobetasol (TEMOVATE) 0.05 % GEL Apply 1 application topically daily as needed. Scalp itching 30 each 1  . meloxicam (MOBIC) 7.5 MG tablet Take 1 tablet (7.5 mg total) by mouth daily as needed for pain. 30 tablet 2  . omeprazole (PRILOSEC) 40 MG capsule TAKE 1 CAPSULE (40 MG TOTAL) BY MOUTH DAILY. 90 capsule 1  . pravastatin (PRAVACHOL) 80 MG tablet TAKE 1 TABLET (80 MG TOTAL) BY MOUTH DAILY. 90 tablet 1  . albuterol (PROVENTIL HFA;VENTOLIN HFA) 108 (90 Base) MCG/ACT inhaler Inhale 2 puffs into the lungs every 6 (six) hours as needed for wheezing or shortness of breath. (Patient not  taking: Reported on 04/22/2016) 1 Inhaler 0   No current facility-administered medications on file prior to visit.    Allergies  Allergen Reactions  . Iodine     REACTION: neck swells    Family History  Problem Relation Age of Onset  . Leukemia Father   . Learning disabilities Son   . Leukemia Other   . Stroke Other   . Emphysema Father     Social History   Social History  . Marital Status: Married    Spouse Name: N/A  . Number of Children: 2  . Years of Education: N/A   Occupational History  . Retired     Administrator  .     Social History Main Topics  . Smoking status: Former Smoker -- 1.00 packs/day for 42 years    Quit date: 08/08/2015  . Smokeless tobacco: Former Systems developer    Types: Chew    Quit date: 11/04/1958     Comment: Started smoking at age 70.  Currently smoking 1 ppd.  . Alcohol Use: 0.0 oz/week    0 Standard drinks or equivalent per week     Comment: Pt reports rare use  . Drug Use: No  . Sexual Activity: Not Asked   Other Topics Concern  . None   Social History Narrative  Review of Systems - See HPI.  All other ROS are negative.  BP 110/58 mmHg  Pulse 78  Temp(Src) 98.1 F (36.7 C) (Oral)  Resp 16  Ht '5\' 9"'$  (1.753 m)  Wt 174 lb 4 oz (79.039 kg)  BMI 25.72 kg/m2  SpO2 96%  Physical Exam  Constitutional: He is oriented to person, place, and time and well-developed, well-nourished, and in no distress.  HENT:  Head: Normocephalic and atraumatic.  Right Ear: External ear normal.  Left Ear: External ear normal.  Nose: Nose normal.  Mouth/Throat: Oropharynx is clear and moist. No oropharyngeal exudate.  Eyes: Conjunctivae are normal. Pupils are equal, round, and reactive to light.  Neck: Neck supple.  Cardiovascular: Normal rate, regular rhythm, normal heart sounds and intact distal pulses.   Pulmonary/Chest: Effort normal and breath sounds normal. No respiratory distress. He has no wheezes. He has no rales. He exhibits tenderness.    Neurological: He is alert and oriented to person, place, and time.  Skin: Skin is warm and dry. No rash noted.  Psychiatric: Affect normal.  Vitals reviewed.   No results found for this or any previous visit (from the past 2160 hour(s)).  Assessment/Plan: 1. Atypical chest pain Chest tenderness secondary to coughing but sometimes noted with deep breathing. Vitals are stable. EKG obtained revealing sinus rhythm. Will check CXR today. Treatment for potential bacterial bronchitis started giving history of lung cancer and increased risk for infection.  - EKG 12-Lead  2. Chest congestion Will obtain CXR today. Giving history and high risk for lung infection, will start Azithromycin. Albuterol refilled. Patient to take as directed.   - DG Chest 2 View; Future - azithromycin (ZITHROMAX) 250 MG tablet; Take 2 tablets on Day 1. Then take 1 tablet daily  Dispense: 6 tablet; Refill: 0   Leeanne Rio, [place]

## 2016-04-22 NOTE — Progress Notes (Signed)
Pre visit review using our clinic review tool, if applicable. No additional management support is needed unless otherwise documented below in the visit note/SLS  

## 2016-04-23 ENCOUNTER — Telehealth: Payer: Self-pay | Admitting: *Deleted

## 2016-04-23 MED ORDER — LEVOFLOXACIN 500 MG PO TABS: 500.0000 mg | ORAL_TABLET | Freq: Every day | ORAL | Status: DC

## 2016-04-23 MED FILL — levoFLOXacin 500 MG TABS: 500 | 7 days supply | Qty: 7 | Fill #0

## 2016-04-23 NOTE — Telephone Encounter (Signed)
-----   Message from Brunetta Jeans, PA-C sent at 04/23/2016  9:18 AM EDT ----- X-ray reveals likely multifocal pneumonia. Would like to stop the antibiotic given yesterday and switch to Levaquin. Ok to send in Rx for Levofloxacin 500 mg PO daily for 7 days. Want him to follow-up Friday. If anything acutely worsens, please have them go to the ER. How is he feeing today?

## 2016-04-23 NOTE — Telephone Encounter (Signed)
Patient Unavailable; spouse informed, understood & agreed; new Rx to pharmacy, F/U scheduled for Fri, 04/26/16 at 10:30a/SLS 06/20

## 2016-04-25 ENCOUNTER — Other Ambulatory Visit: Payer: Self-pay | Admitting: Family

## 2016-04-25 MED FILL — PRAVASTATIN NA 80 MG TAB: 80 | 90 days supply | Qty: 90 | Fill #1

## 2016-04-26 ENCOUNTER — Ambulatory Visit (HOSPITAL_COMMUNITY): Payer: Medicare Other

## 2016-04-26 ENCOUNTER — Encounter: Payer: Self-pay | Admitting: Physician Assistant

## 2016-04-26 ENCOUNTER — Ambulatory Visit (INDEPENDENT_AMBULATORY_CARE_PROVIDER_SITE_OTHER): Payer: Medicare Other | Admitting: Physician Assistant

## 2016-04-26 VITALS — BP 128/82 | HR 76 | Temp 98.2°F | Resp 16 | Ht 69.0 in | Wt 174.1 lb

## 2016-04-26 DIAGNOSIS — J189 Pneumonia, unspecified organism: Secondary | ICD-10-CM

## 2016-04-26 MED ORDER — MELOXICAM 7.5 MG PO TABS
7.5000 mg | ORAL_TABLET | Freq: Every day | ORAL | Status: DC | PRN
Start: 2016-04-26 — End: 2016-08-26

## 2016-04-26 MED FILL — MELOXICAM 7.5 MG TABLET: 7.5 | 30 days supply | Qty: 30 | Fill #0

## 2016-04-26 NOTE — Patient Instructions (Signed)
Please continue taking the antibiotic until it is complete.  Stay hydrated and continue Mucinex. I want you to follow-up one last time with Melissa next Monday or Tuesday.  If anything acutely worsens over the weekend, please go to the ER.

## 2016-04-26 NOTE — Progress Notes (Signed)
Pre visit review using our clinic review tool, if applicable. No additional management support is needed unless otherwise documented below in the visit note/SLS  

## 2016-04-26 NOTE — Progress Notes (Signed)
Patient presents to clinic today for follow-up of community-acquired pneumonia diagnosed via chest x-ray last week. Patient presented to office complaining of one day of cough productive of thick sputum. Patient's vital signs were stable at the time. Lung examination at the time was unremarkable, but given patient's history of lung cancer it was decided an x-ray should be obtained. X-ray revealed pneumonia noted within the lingula and left upper lobe. Patient was started on Levaquin 500 mg for 7 days. Patient endorses taking antibiotic as directed. Endorses mild residual cough that is now nonproductive. Denies fever, chills, chest pain or shortness of breath. Denies new or worsening symptoms.   Past Medical History  Diagnosis Date  . GERD (gastroesophageal reflux disease)   . Alcoholism ([place])     past problem per pt but admits to still drinking - unclear how much  . Tobacco abuse   . Hematuria     refuses work up or referral - understands risks of morbidity / mortality - 11/2008, 12/2008  . Hyperlipemia   . Kidney stones   . Meningioma ([place]) 10/25/2013    Follows with Dr. [name].   . Peripheral vascular disease ([place])     Abdominal Aortic Aneursym  . Pneumonia     as a child  . Anxiety   . History of hiatal hernia   . Kidney cyst, acquired   . Arthritis   . 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    Current Outpatient Prescriptions on File Prior to Visit  Medication Sig Dispense Refill  . albuterol (PROVENTIL HFA;VENTOLIN HFA) 108 (90 Base) MCG/ACT inhaler Inhale 1-2 puffs into the lungs every 6 (six) hours as needed for wheezing or shortness of breath. 6.7 g 1  . aspirin EC 81 MG tablet Take 81 mg by mouth daily.    . clobetasol (TEMOVATE) 0.05 % GEL Apply 1 application topically daily as needed. Scalp itching 30 each 1  . levofloxacin (LEVAQUIN) 500 MG tablet Take 1 tablet (500 mg total)  by mouth daily. 7 tablet 0  . meloxicam (MOBIC) 7.5 MG tablet Take 1 tablet (7.5 mg total) by mouth daily as needed for pain. 30 tablet 2  . omeprazole (PRILOSEC) 40 MG capsule TAKE 1 CAPSULE (40 MG TOTAL) BY MOUTH DAILY. 90 capsule 1  . pravastatin (PRAVACHOL) 80 MG tablet TAKE 1 TABLET (80 MG TOTAL) BY MOUTH DAILY. 90 tablet 1   No current facility-administered medications on file prior to visit.    Allergies  Allergen Reactions  . Iodine     REACTION: neck swells    Family History  Problem Relation Age of Onset  . Leukemia Father   . Learning disabilities Son   . Leukemia Other   . Stroke Other   . Emphysema Father     Social History   Social History  . Marital Status: Married    Spouse Name: N/A  . Number of Children: 2  . Years of Education: N/A   Occupational History  . Retired     Administrator  .     Social History Main Topics  . Smoking status: Former Smoker -- 1.00 packs/day for 28 years    Quit date: 08/08/2015  . Smokeless tobacco: Former Systems developer    Types: Chew    Quit date: 11/04/1958     Comment: Started smoking at age 60.  Currently smoking 1 ppd.  [name]  Alcohol Use: 0.0 oz/week    0 Standard drinks or equivalent per week     Comment: Pt reports rare use  . Drug Use: No  . Sexual Activity: Not Asked   Other Topics Concern  . None   Social History Narrative    Review of Systems - See HPI.  All other ROS are negative.  BP 128/82 mmHg  Pulse 76  Temp(Src) 98.2 F (36.8 C) (Oral)  Resp 16  Ht '5\' 9"'$  (1.753 m)  Wt 174 lb 2 oz (78.983 kg)  BMI 25.70 kg/m2  SpO2 97%  Physical Exam  Constitutional: He is oriented to person, place, and time and well-developed, well-nourished, and in no distress.  HENT:  Head: Normocephalic and atraumatic.  Right Ear: External ear normal.  Left Ear: External ear normal.  Nose: Nose normal.  Mouth/Throat: Oropharynx is clear and moist. No oropharyngeal exudate.  TM within normal limits bilaterally  Eyes:  Conjunctivae are normal.  Neck: Neck supple.  Cardiovascular: Normal rate, regular rhythm, normal heart sounds and intact distal pulses.   Pulmonary/Chest: Effort normal and breath sounds normal. No respiratory distress. He has no wheezes. He has no rales. He exhibits no tenderness.  Neurological: He is alert and oriented to person, place, and time.  Skin: Skin is warm and dry. No rash noted.  Psychiatric: Affect normal.  Vitals reviewed.   No results found for this or any previous visit (from the past 2160 hour(s)).  Assessment/Plan: 1. CAP (community acquired pneumonia) Patient is clinically improving. Vital signs are stable. We'll have him complete entire course of Levaquin. Continue supportive measures and over-the-counter medications. Patient is to schedule a final follow-up with his PCP next week. Giving his significant lung history, advanced age and comorbid conditions, feel this is appropriate. Discussed with patient that if any symptoms recur or acutely worsen over the weekend, he is to proceed to the ER. Patient voices understanding.   Leeanne Rio, PA-C

## 2016-05-01 ENCOUNTER — Encounter: Payer: Self-pay | Admitting: Family

## 2016-05-01 ENCOUNTER — Ambulatory Visit (INDEPENDENT_AMBULATORY_CARE_PROVIDER_SITE_OTHER): Payer: Medicare Other | Admitting: Family

## 2016-05-01 ENCOUNTER — Ambulatory Visit (HOSPITAL_BASED_OUTPATIENT_CLINIC_OR_DEPARTMENT_OTHER)
Admission: RE | Admit: 2016-05-01 | Discharge: 2016-05-01 | Disposition: A | Discharge status: Home / Self Care | Payer: Medicare Other | Source: Ambulatory Visit | Attending: Family | Admitting: Family

## 2016-05-01 ENCOUNTER — Telehealth: Payer: Self-pay | Admitting: Family

## 2016-05-01 VITALS — BP 116/68 | HR 61 | Temp 97.5°F | Resp 18 | Ht 69.0 in | Wt 174.6 lb

## 2016-05-01 DIAGNOSIS — J189 Pneumonia, unspecified organism: Secondary | ICD-10-CM

## 2016-05-01 DIAGNOSIS — Z09 Encounter for follow-up examination after completed treatment for conditions other than malignant neoplasm: Secondary | ICD-10-CM

## 2016-05-01 NOTE — Telephone Encounter (Signed)
Notified pt and entered cxr order.

## 2016-05-01 NOTE — Progress Notes (Signed)
Subjective:    Patient ID: Robert Bates, male    DOB: Jul 16, 1941, 75 y.o.   MRN: [id number]  HPI  CAP- diagnosed on 6/19 weeks ago. Previous office visits are reviewed. Pt completed course of levaquin. Feeling better.  Denies current fever or cough. He does report that he had some vomiting 1-2 days prior to his 6/19 visit.     Review of Systems    see HPI  Past Medical History  Diagnosis Date  . GERD (gastroesophageal reflux disease)   . Alcoholism ([place])     past problem per pt but admits to still drinking - unclear how much  . Tobacco abuse   . Hematuria     refuses work up or referral - understands risks of morbidity / mortality - 11/2008, 12/2008  . Hyperlipemia   . Kidney stones   . Meningioma ([place]) 10/25/2013    Follows with Dr. [name].   . Peripheral vascular disease ([place])     Abdominal Aortic Aneursym  . Pneumonia     as a child  . Anxiety   . History of hiatal hernia   . Kidney cyst, acquired   . Arthritis   . 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     Social History   Social History  . Marital Status: Married    Spouse Name: N/A  . Number of Children: 2  . Years of Education: N/A   Occupational History  . Retired     Administrator  .     Social History Main Topics  . Smoking status: Former Smoker -- 1.00 packs/day for 37 years    Quit date: 08/08/2015  . Smokeless tobacco: Former Systems developer    Types: Chew    Quit date: 11/04/1958     Comment: Started smoking at age 29.  Currently smoking 1 ppd.  . Alcohol Use: 0.0 oz/week    0 Standard drinks or equivalent per week     Comment: Pt reports rare use  . Drug Use: No  . Sexual Activity: Not on file   Other Topics Concern  . Not on file   Social History Narrative    Past Surgical History  Procedure Laterality Date  . Tonsillectomy    . Hernia repair    . Video bronchoscopy Bilateral 07/26/2015   Procedure: VIDEO BRONCHOSCOPY WITH FLUORO;  Surgeon: [name], MD;  Location: [facility];  Service: Cardiopulmonary;  Laterality: Bilateral;  . Eye surgery Bilateral     Cataracts removed w/ lens implant  . Colonoscopy    . Video bronchoscopy with endobronchial ultrasound N/A 08/23/2015    Procedure: VIDEO BRONCHOSCOPY WITH ENDOBRONCHIAL ULTRASOUND;  Surgeon: Grace Isaac, MD;  Location: [facility];  Service: Thoracic;  Laterality: N/A;  . Video bronchoscopy with endobronchial navigation N/A 08/23/2015    Procedure: VIDEO BRONCHOSCOPY WITH ENDOBRONCHIAL NAVIGATION;  Surgeon: Grace Isaac, MD;  Location: [facility];  Service: Thoracic;  Laterality: N/A;    Family History  Problem Relation Age of Onset  . Leukemia Father   . Learning disabilities Son   . Leukemia Other   . Stroke Other   . Emphysema Father     Allergies  Allergen Reactions  . Iodine     REACTION: neck swells    Current Outpatient Prescriptions on File Prior to Visit  Medication Sig Dispense Refill  . albuterol (PROVENTIL HFA;VENTOLIN  HFA) 108 (90 Base) MCG/ACT inhaler Inhale 1-2 puffs into the lungs every 6 (six) hours as needed for wheezing or shortness of breath. 6.7 g 1  . aspirin EC 81 MG tablet Take 81 mg by mouth daily.    . clobetasol (TEMOVATE) 0.05 % GEL Apply 1 application topically daily as needed. Scalp itching 30 each 1  . meloxicam (MOBIC) 7.5 MG tablet TAKE 1 TABLET (7.5 MG TOTAL) BY MOUTH DAILY. 30 tablet 2  . meloxicam (MOBIC) 7.5 MG tablet Take 1 tablet (7.5 mg total) by mouth daily as needed for pain. 30 tablet 0  . omeprazole (PRILOSEC) 40 MG capsule TAKE 1 CAPSULE (40 MG TOTAL) BY MOUTH DAILY. 90 capsule 1  . pravastatin (PRAVACHOL) 80 MG tablet TAKE 1 TABLET (80 MG TOTAL) BY MOUTH DAILY. 90 tablet 1   No current facility-administered medications on file prior to visit.    BP 116/68 mmHg  Pulse 61  Temp(Src) 97.5 F (36.4 C) (Oral)  Resp 18  Ht '5\' 9"'$  (1.753 m)  Wt 174 lb 9.6 oz  (79.198 kg)  BMI 25.77 kg/m2  SpO2 96%    Objective:   Physical Exam  Constitutional: He is oriented to person, place, and time. He appears well-developed and well-nourished. No distress.  HENT:  Head: Normocephalic and atraumatic.  Cardiovascular: Normal rate and regular rhythm.   No murmur heard. Pulmonary/Chest: Effort normal and breath sounds normal. No respiratory distress. He has no wheezes. He has no rales.  Musculoskeletal: He exhibits no edema.  Neurological: He is alert and oriented to person, place, and time.  Skin: Skin is warm and dry.  Psychiatric: He has a normal mood and affect. His behavior is normal. Thought content normal.          Assessment & Plan:  Community Acquired PNA- clinically resolved. Discussed that aspiration PNA is a possibility given that it occurred following an episode of vomiting. Will obtain follow up CXR to ensure resolution.

## 2016-05-01 NOTE — Patient Instructions (Addendum)
Please complete x-ray on the first floor.  

## 2016-05-01 NOTE — Telephone Encounter (Signed)
X ray still showing changes of PNA.  I would like him to repeat cxr in 3 weeks please.

## 2016-05-01 NOTE — Progress Notes (Signed)
Pre visit review using our clinic review tool, if applicable. No additional management support is needed unless otherwise documented below in the visit note. 

## 2016-05-03 ENCOUNTER — Telehealth: Payer: Self-pay | Admitting: Family

## 2016-05-03 NOTE — Telephone Encounter (Signed)
Called pt and had bad connection and was unable to hear anything pt was saying. Informed pt that I would hang up and call right back. Called back, no answer. Left message to return call.

## 2016-05-03 NOTE — Telephone Encounter (Signed)
Pt called in to speak with CMA. He says that he want to speak with someone in regards to his x-ray.    CB: [phone]

## 2016-05-06 NOTE — Telephone Encounter (Signed)
Spoke with pt. He states that he has follow up with oncology on 05/22/16 and is supposed to be having a lung scan at that time. Pt wants to know if that will be sufficient. Per verbal from PCP, ok to cancel cxr in 3 weeks and complete at oncology follow up. Order cancelled.

## 2016-05-06 NOTE — Telephone Encounter (Signed)
Robert Bates-- please advise xray result?

## 2016-05-06 NOTE — Telephone Encounter (Signed)
See 6/28 phone note please.

## 2016-05-22 ENCOUNTER — Ambulatory Visit (HOSPITAL_COMMUNITY)
Admission: RE | Admit: 2016-05-22 | Discharge: 2016-05-22 | Disposition: A | Discharge status: Home / Self Care | Payer: Medicare Other | Source: Ambulatory Visit | Attending: Radiation Oncology | Admitting: Radiation Oncology

## 2016-05-22 ENCOUNTER — Ambulatory Visit: Admission: RE | Admit: 2016-05-22 | Payer: Medicare Other | Source: Ambulatory Visit

## 2016-05-22 DIAGNOSIS — N200 Calculus of kidney: Secondary | ICD-10-CM | POA: Insufficient documentation

## 2016-05-22 DIAGNOSIS — I700 Atherosclerosis of aorta: Secondary | ICD-10-CM | POA: Diagnosis not present

## 2016-05-22 DIAGNOSIS — K8020 Calculus of gallbladder without cholecystitis without obstruction: Secondary | ICD-10-CM | POA: Diagnosis not present

## 2016-05-22 DIAGNOSIS — R911 Solitary pulmonary nodule: Secondary | ICD-10-CM | POA: Diagnosis not present

## 2016-05-22 DIAGNOSIS — N281 Cyst of kidney, acquired: Secondary | ICD-10-CM | POA: Diagnosis not present

## 2016-05-22 DIAGNOSIS — K449 Diaphragmatic hernia without obstruction or gangrene: Secondary | ICD-10-CM | POA: Diagnosis not present

## 2016-05-22 DIAGNOSIS — C3432 Malignant neoplasm of lower lobe, left bronchus or lung: Secondary | ICD-10-CM | POA: Insufficient documentation

## 2016-05-22 NOTE — Telephone Encounter (Signed)
Completed.

## 2016-05-23 ENCOUNTER — Ambulatory Visit: Payer: Medicare Other | Admitting: Radiation Oncology

## 2016-06-03 ENCOUNTER — Encounter: Payer: Self-pay | Admitting: Oncology

## 2016-06-06 ENCOUNTER — Ambulatory Visit
Admission: RE | Admit: 2016-06-06 | Discharge: 2016-06-06 | Disposition: A | Discharge status: Home / Self Care | Payer: Medicare Other | Source: Ambulatory Visit | Attending: Radiation Oncology | Admitting: Radiation Oncology

## 2016-06-06 ENCOUNTER — Encounter: Payer: Self-pay | Admitting: Radiation Oncology

## 2016-06-06 VITALS — BP 120/69 | HR 67 | Temp 97.8°F | Ht 69.0 in | Wt 172.4 lb

## 2016-06-06 DIAGNOSIS — N200 Calculus of kidney: Secondary | ICD-10-CM | POA: Insufficient documentation

## 2016-06-06 DIAGNOSIS — K449 Diaphragmatic hernia without obstruction or gangrene: Secondary | ICD-10-CM | POA: Diagnosis not present

## 2016-06-06 DIAGNOSIS — C3482 Malignant neoplasm of overlapping sites of left bronchus and lung: Secondary | ICD-10-CM | POA: Diagnosis not present

## 2016-06-06 DIAGNOSIS — C3432 Malignant neoplasm of lower lobe, left bronchus or lung: Secondary | ICD-10-CM | POA: Insufficient documentation

## 2016-06-06 DIAGNOSIS — K8020 Calculus of gallbladder without cholecystitis without obstruction: Secondary | ICD-10-CM | POA: Insufficient documentation

## 2016-06-06 DIAGNOSIS — I700 Atherosclerosis of aorta: Secondary | ICD-10-CM | POA: Diagnosis not present

## 2016-06-06 NOTE — Progress Notes (Signed)
Radiation Oncology         [phone] ________________________________  Name: Robert Bates MRN: [id number]  Date: 06/06/2016  DOB: 07-Mar-1941  Follow-Up Visit Note  CC: Nance Pear., NP  Grace Isaac, MD    ICD-9-CM ICD-10-CM   1. 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) 162.5 C34.32 CT Chest Wo Contrast  2. 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 CT scan on 05/22/16 noted radiation changes in the central left lung with no residual obstruction or discrete pulmonary nodules in the LLL. 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  He denies having pain or shortness of breath.  He reports having an occasional cough with white sputum.  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 No reports of fever or hemoptysis.  ALLERGIES:  is allergic to iodine.  Meds: Current Outpatient Prescriptions  Medication Sig Dispense Refill  . albuterol (PROVENTIL HFA;VENTOLIN HFA) 108 (90 Base) MCG/ACT inhaler Inhale 1-2 puffs into the lungs every 6 (six) hours as needed for wheezing or shortness of breath. 6.7 g 1  . aspirin EC 81 MG tablet Take 81 mg by mouth daily.    [name] omeprazole (PRILOSEC) 40 MG capsule TAKE 1 CAPSULE (40 MG TOTAL) BY MOUTH DAILY. 90 capsule 1  . pravastatin (PRAVACHOL) 80 MG tablet TAKE 1 TABLET (80 MG TOTAL) BY MOUTH DAILY. 90 tablet 1  . clobetasol (TEMOVATE) 0.05 % GEL Apply 1 application topically daily as needed. Scalp itching (Patient not taking: Reported on 06/06/2016) 30 each 1  . meloxicam (MOBIC) 7.5 MG tablet TAKE 1 TABLET (7.5 MG TOTAL) BY MOUTH DAILY. (Patient  not taking: Reported on 06/06/2016) 30 tablet 2  . meloxicam (MOBIC) 7.5 MG tablet Take 1 tablet (7.5 mg total) by mouth daily as needed for pain. (Patient not taking: Reported on 06/06/2016) 30 tablet 0   No current facility-administered medications for this encounter.     Physical Findings: The patient is in no acute distress. Patient is alert and oriented.  height is '5\' 9"'$  (1.753 m) and weight is 172 lb 6.4 oz (78.2 kg). His oral temperature is 97.8 F (36.6 C). His blood pressure is 120/69 and his pulse is 67. His oxygen saturation is 97%.  Lungs are clear to auscultation bilaterally. Heart has regular rate and rhythm. No palpable cervical, supraclavicular, or axillary adenopathy.  Lab Findings: Lab Results  Component Value Date   WBC 10.2 08/17/2015   HGB 14.8 08/17/2015   HCT 44.4 08/17/2015   MCV 91.9 08/17/2015   PLT 276 08/17/2015    Radiographic Findings: Ct Chest Wo Contrast  Result Date: 05/22/2016 CLINICAL DATA:  Followup left lung carcinoma, status post radiation therapy. EXAM: CT CHEST WITHOUT CONTRAST TECHNIQUE: Multidetector CT imaging of the chest was performed following the standard protocol without IV contrast. COMPARISON:  12/08/2015 FINDINGS: Cardiovascular: Normal heart size. Coronary artery calcification and thoracic aortic atherosclerotic calcification noted. Mediastinum/Nodes: Moderate hiatal hernia again noted. No pathologically enlarged lymph nodes identified on this unenhanced exam. 8 mm right paratracheal lymph node on image 26/2 remains stable. Lungs/Pleura: Mild emphysema  again noted. Post radiation changes are now seen in central left perihilar and paramediastinal lung zones. Increased ill-defined airspace disease is seen in the left upper lobe in area of previous ground-glass nodule. 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 No persistent central  endobronchial obstruction seen in the left lower lobe, and no discrete pulmonary nodules or masses identified. No evidence of pleural effusion. Upper Abdomen: Normal adrenal glands. Cholelithiasis and probable tiny hepatic cysts remain stable. Small hemorrhagic left renal cyst and tiny nonobstructive left renal calculus also unchanged compared to CT images from previous PET. Musculoskeletal: No suspicious bone lesions identified. IMPRESSION: Increased central left lung radiation changes. No residual left lower lobe endobronchial obstruction or discrete pulmonary nodules or masses. Increased left upper lobe airspace disease in area of previous pulmonary ground-glass nodule. 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 Recommend continued short-term followup by chest CT without contrast in 3 months. Stable incidental findings including aortic atherosclerosis, moderate hiatal hernia, cholelithiasis, and left renal hemorrhagic cyst and nephrolithiasis. Electronically Signed   By: Earle Gell M.D.   On: 05/22/2016 15:45    Impression:  The patient is doing well clinically. Recent chest x-ray shows no evidence of recurrence patient reports the recent history of pneumonia which may explain the discussion of potential radiation pneumonitis  Plan: CT scan without contrast in 3 months and a follow up afterwards.  ____________________________________ -----------------------------------  Blair Promise, PhD, MD  This document serves as a record of services personally performed by Gery Pray, MD. It was created on his behalf by Darcus Austin, a trained medical scribe. The creation of this record is based on the scribe's personal observations and the provider's statements to them. This document has been checked and approved by the attending provider.

## 2016-06-06 NOTE — Progress Notes (Signed)
Fransisca Connors here for follow up.  He denies having pain, shortness of breath.  He reports having an occasional cough with white sputum.  He reports feeling tired today because he worked until 1 pm last night.  BP 120/69 (BP Location: Right Arm, Patient Position: Sitting)   Pulse 67   Temp 97.8 F (36.6 C) (Oral)   Ht '5\' 9"'$  (1.753 m)   Wt 172 lb 6.4 oz (78.2 kg)   SpO2 97%   BMI 25.46 kg/m    Wt Readings from Last 3 Encounters:  06/06/16 172 lb 6.4 oz (78.2 kg)  05/01/16 174 lb 9.6 oz (79.2 kg)  04/26/16 174 lb 2 oz (79 kg)

## 2016-06-07 ENCOUNTER — Ambulatory Visit: Payer: Medicare Other

## 2016-07-01 ENCOUNTER — Ambulatory Visit: Payer: Medicare Other | Admitting: *Deleted

## 2016-07-01 NOTE — Progress Notes (Deleted)
Subjective:   Robert Bates is a 75 y.o. male who presents for Medicare Annual/Subsequent preventive examination.  Review of Systems:  No ROS.  Medicare Wellness Visit.     Sleep patterns:    Home Safety/Smoke Alarms:   Living environment; residence and Firearm Safety:  Seat Belt Safety/Bike Helmet:    Counseling:   Eye Exam-  Dental-   Male:   CCS- 12/08/09, 10 year recall     PSA-  Lab Results  Component Value Date   PSA 1.88 11/17/2009   PSA 2.02 12/26/2008      Objective:    Vitals: There were no vitals taken for this visit.  There is no height or weight on file to calculate BMI.  Tobacco History  Smoking Status  . Former Smoker  . Packs/day: 1.00  . Years: 57.00  . Quit date: 08/08/2015  Smokeless Tobacco  . Former Systems developer  . Types: Chew  . Quit date: 11/04/1958    Comment: Started smoking at age 20.  Currently smoking 1 ppd.     Counseling given: Not Answered   Past Medical History:  Diagnosis Date  . Alcoholism ([place])    past problem per pt but admits to still drinking - unclear how much  . Anxiety   . Arthritis   . 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  [name] GERD (gastroesophageal reflux disease)   . Hematuria    refuses work up or referral - understands risks of morbidity / mortality - 11/2008, 12/2008  . History of hiatal hernia   . Hyperlipemia   . Kidney cyst, acquired   . Kidney stones   . Meningioma ([place]) 10/25/2013   Follows with Dr. [name].   . Peripheral vascular disease ([place])    Abdominal Aortic Aneursym  . Pneumonia    as a child  . Radiation 09/18/15-10/25/15   left lower lobe 70.2 Gy  . Tobacco abuse    Past Surgical History:  Procedure Laterality Date  . COLONOSCOPY    . EYE SURGERY Bilateral    Cataracts removed w/ lens implant  . HERNIA REPAIR    . TONSILLECTOMY    . VIDEO BRONCHOSCOPY Bilateral 07/26/2015   Procedure: VIDEO  BRONCHOSCOPY WITH FLUORO;  Surgeon: [name], MD;  Location: [facility];  Service: Cardiopulmonary;  Laterality: Bilateral;  . VIDEO BRONCHOSCOPY WITH ENDOBRONCHIAL NAVIGATION N/A 08/23/2015   Procedure: VIDEO BRONCHOSCOPY WITH ENDOBRONCHIAL NAVIGATION;  Surgeon: Grace Isaac, MD;  Location: [place];  Service: Thoracic;  Laterality: N/A;  . VIDEO BRONCHOSCOPY WITH ENDOBRONCHIAL ULTRASOUND N/A 08/23/2015   Procedure: VIDEO BRONCHOSCOPY WITH ENDOBRONCHIAL ULTRASOUND;  Surgeon: Grace Isaac, MD;  Location: [facility];  Service: Thoracic;  Laterality: N/A;   Family History  Problem Relation Age of Onset  . Leukemia Father   . Learning disabilities Son   . Leukemia Other   . Stroke Other   . Emphysema Father    History  Sexual Activity  . Sexual activity: Not on file    Outpatient Encounter Prescriptions as of 07/01/2016  Medication Sig  . albuterol (PROVENTIL HFA;VENTOLIN HFA) 108 (90 Base) MCG/ACT inhaler Inhale 1-2 puffs into the lungs every 6 (six) hours as needed for wheezing or shortness of breath.  [name] aspirin EC 81 MG tablet Take 81 mg by mouth daily.  . clobetasol (TEMOVATE) 0.05 % GEL Apply 1 application topically daily as needed. Scalp  itching (Patient not taking: Reported on 06/06/2016)  . meloxicam (MOBIC) 7.5 MG tablet TAKE 1 TABLET (7.5 MG TOTAL) BY MOUTH DAILY. (Patient not taking: Reported on 06/06/2016)  . meloxicam (MOBIC) 7.5 MG tablet Take 1 tablet (7.5 mg total) by mouth daily as needed for pain. (Patient not taking: Reported on 06/06/2016)  . omeprazole (PRILOSEC) 40 MG capsule TAKE 1 CAPSULE (40 MG TOTAL) BY MOUTH DAILY.  . pravastatin (PRAVACHOL) 80 MG tablet TAKE 1 TABLET (80 MG TOTAL) BY MOUTH DAILY.   No facility-administered encounter medications on file as of 07/01/2016.     Activities of Daily Living In your present state of health, do you have any difficulty performing the following activities: 08/14/2015  Hearing? N  Vision? N  Difficulty concentrating  or making decisions? N  Walking or climbing stairs? N  Dressing or bathing? N  Some recent data might be hidden    Patient Care Team: [name], NP as PCP - General Elsie Stain, MD as Attending Physician (Pulmonary Disease) Virgina Evener, OD (Optometry)   Assessment:    Physical assessment deferred to PCP.  Exercise Activities and Dietary recommendations    Diet (meal preparation, eat out, water intake, caffeinated beverages, dairy products, fruits and vegetables): Breakfast: Lunch:  Dinner:      Goals    None     Fall Risk Fall Risk  12/14/2015 11/30/2015 10/17/2015 10/10/2015 09/19/2015  Falls in the past year? No Yes No No No  Number falls in past yr: - 1 - - -  Injury with Fall? No Yes - - -  Risk for fall due to : - History of fall(s) - - -   Depression Screen PHQ 2/9 Scores 12/14/2015 11/30/2015 10/17/2015 10/10/2015  PHQ - 2 Score 0 0 0 0  PHQ- 9 Score - - - -    Cognitive Testing No flowsheet data found.  Immunization History  Administered Date(s) Administered  . Influenza Split 08/02/2011, 08/18/2012  . Influenza Whole 08/17/2008  . Influenza, High Dose Seasonal PF 08/09/2015  . Influenza,inj,Quad PF,36+ Mos 07/27/2013, 08/09/2014  . Pneumococcal Conjugate-13 08/09/2014  . Pneumococcal Polysaccharide-23 05/14/2010  . Td 07/25/2008   Screening Tests Health Maintenance  Topic Date Due  . INFLUENZA VACCINE  06/04/2016  . ZOSTAVAX  03/08/2017 (Originally 06/20/2001)  . TETANUS/TDAP  07/25/2018  . COLONOSCOPY  12/09/2019  . PNA vac Low Risk Adult  Completed      Plan:    Continue to eat heart healthy diet (full of fruits, vegetables, whole grains, lean protein, water--limit salt, fat, and sugar intake) and increase physical activity as tolerated.  Continue doing brain stimulating activities (puzzles, reading, adult coloring books, staying active) to keep memory sharp.   Follow-up with Melissa as scheduled. During the course of the visit the  patient was educated and counseled about the following appropriate screening and preventive services:   Vaccines to include Pneumoccal, Influenza, Hepatitis B, Td, Zostavax, HCV  Electrocardiogram  Cardiovascular Disease  Colorectal cancer screening  Diabetes screening  Prostate Cancer Screening  Glaucoma screening  Nutrition counseling   Smoking cessation counseling  Patient Instructions (the written plan) was given to the patient.    Dorrene German, RN  07/01/2016

## 2016-07-01 NOTE — Progress Notes (Deleted)
Pre visit review using our clinic review tool, if applicable. No additional management support is needed unless otherwise documented below in the visit note. 

## 2016-07-10 ENCOUNTER — Other Ambulatory Visit: Payer: Self-pay | Admitting: Family

## 2016-07-10 MED FILL — MELOXICAM 7.5 MG TABLET: 7.5 | 30 days supply | Qty: 30 | Fill #0

## 2016-07-10 MED FILL — OMEPRAZOLE DR 40 MG CAPSULE: 40 | 90 days supply | Qty: 90 | Fill #0

## 2016-08-07 ENCOUNTER — Other Ambulatory Visit: Payer: Self-pay | Admitting: Family

## 2016-08-07 MED FILL — PRAVASTATIN SODIUM 80 MG TA: 80 | 90 days supply | Qty: 90 | Fill #0

## 2016-08-26 ENCOUNTER — Encounter: Payer: Self-pay | Admitting: Family

## 2016-08-26 ENCOUNTER — Ambulatory Visit (INDEPENDENT_AMBULATORY_CARE_PROVIDER_SITE_OTHER): Payer: Medicare Other | Admitting: Family

## 2016-08-26 VITALS — BP 125/71 | HR 65 | Temp 98.0°F | Resp 16 | Ht 69.0 in | Wt 173.2 lb

## 2016-08-26 DIAGNOSIS — I10 Essential (primary) hypertension: Secondary | ICD-10-CM | POA: Diagnosis not present

## 2016-08-26 DIAGNOSIS — K219 Gastro-esophageal reflux disease without esophagitis: Secondary | ICD-10-CM | POA: Diagnosis not present

## 2016-08-26 DIAGNOSIS — E785 Hyperlipidemia, unspecified: Secondary | ICD-10-CM | POA: Diagnosis not present

## 2016-08-26 DIAGNOSIS — J020 Streptococcal pharyngitis: Secondary | ICD-10-CM

## 2016-08-26 DIAGNOSIS — M545 Low back pain, unspecified: Secondary | ICD-10-CM

## 2016-08-26 DIAGNOSIS — H6690 Otitis media, unspecified, unspecified ear: Secondary | ICD-10-CM

## 2016-08-26 DIAGNOSIS — G8929 Other chronic pain: Secondary | ICD-10-CM

## 2016-08-26 LAB — POCT RAPID STREP A (OFFICE): Rapid Strep A Screen: POSITIVE — AB

## 2016-08-26 MED ORDER — AMOXICILLIN 500 MG PO CAPS: 500.0000 mg | ORAL_CAPSULE | Freq: Three times a day (TID) | ORAL | 0 refills | Status: DC

## 2016-08-26 MED FILL — AMOXICILLIN 500 MG CAPSULE: 500 | 10 days supply | Qty: 30 | Fill #0

## 2016-08-26 NOTE — Assessment & Plan Note (Signed)
Stable on PPI 

## 2016-08-26 NOTE — Assessment & Plan Note (Signed)
BP is stable on low sodium diet. Continue same.

## 2016-08-26 NOTE — Progress Notes (Signed)
Pre visit review using our clinic review tool, if applicable. No additional management support is needed unless otherwise documented below in the visit note. 

## 2016-08-26 NOTE — Assessment & Plan Note (Signed)
Tolerating statin. Will order follow up lipid panel.

## 2016-08-26 NOTE — Patient Instructions (Addendum)
Please complete lab work prior to leaving. Begin amoxicillin for strep throat. You may use tylenol as needed for sore throat.  Call if sore throat worsens or if not improved in 2-3 days.

## 2016-08-26 NOTE — Assessment & Plan Note (Signed)
Stable, continue meloxicam.

## 2016-08-26 NOTE — Progress Notes (Addendum)
Subjective:    Patient ID: Robert Bates, male    DOB: 07/22/1941, 75 y.o.   MRN: [id number]  HPI  Robert Bates is a 75 yr old male who presents today for follow up.  1) HTN- BP is currently diet controlled.  Reports that he monitors his sodium intake.  BP Readings from Last 3 Encounters:  08/26/16 125/71  06/06/16 120/69  05/01/16 116/68   2) Hyperlipidemia- Patient is maintained on pravastatin.  Denies myalgia.  Lab Results  Component Value Date   CHOL 145 06/19/2015   HDL 46.90 06/19/2015   LDLCALC 62 06/19/2015   LDLDIRECT 142.2 12/26/2008   TRIG 180.0 (H) 06/19/2015   CHOLHDL 3 06/19/2015   3) Lung CA- s/p radiation treatment.  He last saw Dr. Sondra Bates (radiation oncologist) on 8/3 and he is to have a 3 month follow up CT scan per Dr. Sondra Bates.   4) low back pain- continues meloxicam. Reports some improvement with meloxicam.    5) GERD- reports that gerd symptoms are well controlled on omeprazole.   6) Sore throat- reports sore throat began yesterday. Left ear hurts.   Review of Systems See HPI  Past Medical History:  Diagnosis Date  . Alcoholism ([place])    past problem per pt but admits to still drinking - unclear how much  . Anxiety   . Arthritis   . 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  [name] GERD (gastroesophageal reflux disease)   . Hematuria    refuses work up or referral - understands risks of morbidity / mortality - 11/2008, 12/2008  . History of hiatal hernia   . Hyperlipemia   . Kidney cyst, acquired   . Kidney stones   . Meningioma ([place]) 10/25/2013   Follows with Dr. Ashok Bates.   . Peripheral vascular disease ([place])    Abdominal Aortic Aneursym  . Pneumonia    as a child  . Radiation 09/18/15-10/25/15   left lower lobe 70.2 Gy  . Tobacco abuse      Social History   Social History  . Marital status: Married    Spouse name: N/A  . Number of children: 2  .  Years of education: N/A   Occupational History  . Retired Scientist, research (medical)  .  [employer]   Social History Main Topics  . Smoking status: Former Smoker    Packs/day: 1.00    Years: 57.00    Quit date: 08/08/2015  . Smokeless tobacco: Former Systems developer    Types: Chew    Quit date: 11/04/1958     Comment: Started smoking at age 50.  Currently smoking 1 ppd.  . Alcohol use 0.0 oz/week     Comment: Pt reports rare use  . Drug use: No  . Sexual activity: Not on file   Other Topics Concern  . Not on file   Social History Narrative  . No narrative on file    Past Surgical History:  Procedure Laterality Date  . COLONOSCOPY    . EYE SURGERY Bilateral    Cataracts removed w/ lens implant  . HERNIA REPAIR    . TONSILLECTOMY    . VIDEO BRONCHOSCOPY Bilateral 07/26/2015   Procedure: VIDEO BRONCHOSCOPY WITH FLUORO;  Surgeon: [name], MD;  Location: [facility];  Service: Cardiopulmonary;  Laterality: Bilateral;  . VIDEO BRONCHOSCOPY WITH ENDOBRONCHIAL NAVIGATION N/A 08/23/2015   Procedure:  VIDEO BRONCHOSCOPY WITH ENDOBRONCHIAL NAVIGATION;  Surgeon: Grace Isaac, MD;  Location: [place];  Service: Thoracic;  Laterality: N/A;  . VIDEO BRONCHOSCOPY WITH ENDOBRONCHIAL ULTRASOUND N/A 08/23/2015   Procedure: VIDEO BRONCHOSCOPY WITH ENDOBRONCHIAL ULTRASOUND;  Surgeon: Grace Isaac, MD;  Location: [facility];  Service: Thoracic;  Laterality: N/A;    Family History  Problem Relation Age of Onset  . Leukemia Father   . Emphysema Father   . Learning disabilities Son   . Leukemia Other   . Stroke Other     Allergies  Allergen Reactions  . Iodine     REACTION: neck swells    Current Outpatient Prescriptions on File Prior to Visit  Medication Sig Dispense Refill  . aspirin EC 81 MG tablet Take 81 mg by mouth daily.    . meloxicam (MOBIC) 7.5 MG tablet TAKE 1 TABLET (7.5 MG TOTAL) BY MOUTH DAILY. 30 tablet 2  . omeprazole (PRILOSEC) 40 MG capsule TAKE 1 CAPSULE (40 MG  TOTAL) BY MOUTH DAILY. 90 capsule 0  . pravastatin (PRAVACHOL) 80 MG tablet TAKE 1 TABLET (80 MG TOTAL) BY MOUTH DAILY. 90 tablet 1  . albuterol (PROVENTIL HFA;VENTOLIN HFA) 108 (90 Base) MCG/ACT inhaler Inhale 1-2 puffs into the lungs every 6 (six) hours as needed for wheezing or shortness of breath. (Patient not taking: Reported on 08/26/2016) 6.7 g 1  . clobetasol (TEMOVATE) 0.05 % GEL Apply 1 application topically daily as needed. Scalp itching (Patient not taking: Reported on 08/26/2016) 30 each 1   No current facility-administered medications on file prior to visit.     BP 125/71 (BP Location: Right Arm, Patient Position: Sitting, Cuff Size: Normal)   Pulse 65   Temp 98 F (36.7 C) (Oral)   Resp 16   Ht '5\' 9"'$  (1.753 m)   Wt 173 lb 3.2 oz (78.6 kg)   SpO2 98% Comment: room air  BMI 25.58 kg/m       Objective:   Physical Exam  Constitutional: He is oriented to person, place, and time. He appears well-developed and well-nourished. No distress.  HENT:  Head: Normocephalic and atraumatic.  Mouth/Throat: Posterior oropharyngeal erythema present.  Bilateral cerumen impaction.  (cerumen removed with irrigation) reveals injection of left TM.   + blisters noted on uvula.   Cardiovascular: Normal rate and regular rhythm.   No murmur heard. Pulmonary/Chest: Effort normal and breath sounds normal. No respiratory distress. He has no wheezes. He has no rales.  Musculoskeletal: He exhibits no edema.  Neurological: He is alert and oriented to person, place, and time.  Skin: Skin is warm and dry.  Psychiatric: He has a normal mood and affect. His behavior is normal. Thought content normal.          Assessment & Plan:  Strep Throat/L Otitis Media- rapid strep is +, rx with amoxicillin, add tylenol prn pain.

## 2016-09-02 ENCOUNTER — Ambulatory Visit (HOSPITAL_COMMUNITY): Payer: Medicare Other

## 2016-09-02 ENCOUNTER — Ambulatory Visit: Payer: Medicare Other | Admitting: Family

## 2016-09-04 ENCOUNTER — Ambulatory Visit (HOSPITAL_BASED_OUTPATIENT_CLINIC_OR_DEPARTMENT_OTHER): Payer: Medicare Other

## 2016-09-05 ENCOUNTER — Inpatient Hospital Stay: Admission: RE | Admit: 2016-09-05 | Payer: Self-pay | Source: Ambulatory Visit | Admitting: Radiation Oncology

## 2016-09-06 ENCOUNTER — Telehealth: Payer: Self-pay | Admitting: *Deleted

## 2016-09-06 NOTE — Telephone Encounter (Signed)
Called patient to inform of CT on 09-14-16 @ [place], spoke with patient and he is aware of this test.

## 2016-09-14 ENCOUNTER — Ambulatory Visit (HOSPITAL_BASED_OUTPATIENT_CLINIC_OR_DEPARTMENT_OTHER): Payer: Medicare Other

## 2016-09-16 ENCOUNTER — Ambulatory Visit: Payer: Medicare Other

## 2016-09-30 ENCOUNTER — Ambulatory Visit (INDEPENDENT_AMBULATORY_CARE_PROVIDER_SITE_OTHER): Payer: Medicare Other

## 2016-09-30 ENCOUNTER — Ambulatory Visit (HOSPITAL_BASED_OUTPATIENT_CLINIC_OR_DEPARTMENT_OTHER)
Admission: RE | Admit: 2016-09-30 | Discharge: 2016-09-30 | Disposition: A | Discharge status: Home / Self Care | Payer: Medicare Other | Source: Ambulatory Visit | Attending: Radiation Oncology | Admitting: Radiation Oncology

## 2016-09-30 DIAGNOSIS — R918 Other nonspecific abnormal finding of lung field: Secondary | ICD-10-CM | POA: Insufficient documentation

## 2016-09-30 DIAGNOSIS — I700 Atherosclerosis of aorta: Secondary | ICD-10-CM | POA: Insufficient documentation

## 2016-09-30 DIAGNOSIS — C3432 Malignant neoplasm of lower lobe, left bronchus or lung: Secondary | ICD-10-CM | POA: Diagnosis not present

## 2016-09-30 DIAGNOSIS — Z23 Encounter for immunization: Secondary | ICD-10-CM

## 2016-10-14 ENCOUNTER — Ambulatory Visit
Admission: RE | Admit: 2016-10-14 | Discharge: 2016-10-14 | Disposition: A | Discharge status: Home / Self Care | Payer: Medicare Other | Source: Ambulatory Visit | Attending: Radiation Oncology | Admitting: Radiation Oncology

## 2016-10-14 ENCOUNTER — Encounter: Payer: Self-pay | Admitting: Radiation Oncology

## 2016-10-14 VITALS — BP 123/82 | HR 58 | Temp 97.5°F | Ht 69.0 in | Wt 173.2 lb

## 2016-10-14 DIAGNOSIS — Z923 Personal history of irradiation: Secondary | ICD-10-CM | POA: Insufficient documentation

## 2016-10-14 DIAGNOSIS — Z888 Allergy status to other drugs, medicaments and biological substances status: Secondary | ICD-10-CM | POA: Diagnosis not present

## 2016-10-14 DIAGNOSIS — Z7982 Long term (current) use of aspirin: Secondary | ICD-10-CM | POA: Insufficient documentation

## 2016-10-14 DIAGNOSIS — C3492 Malignant neoplasm of unspecified part of left bronchus or lung: Secondary | ICD-10-CM | POA: Diagnosis present

## 2016-10-14 DIAGNOSIS — C3482 Malignant neoplasm of overlapping sites of left bronchus and lung: Secondary | ICD-10-CM

## 2016-10-14 NOTE — Progress Notes (Signed)
Robert Bates is here for follow up.  He reports having pain in his left foot at night.  He reports having one episode of shortness of breath 2 weeks ago when putting rolling down landing gear on a trailer.  He reports having a frequent, dry cough when he wakes up in the morning.  He denies having hemoptysis.  He reports having pneumonia in September.  He denies having fatigue and reports having a good energy level.  BP 123/82 (BP Location: Right Arm, Patient Position: Sitting)   Pulse (!) 58   Temp 97.5 F (36.4 C) (Oral)   Ht '5\' 9"'$  (1.753 m)   Wt 173 lb 3.2 oz (78.6 kg)   SpO2 99%   BMI 25.58 kg/m

## 2016-10-14 NOTE — Progress Notes (Signed)
Radiation Oncology         [phone] ________________________________  Name: Robert Bates MRN: [id number]  Date: 10/14/2016  DOB: [birth year]  Follow-Up Visit Note  CC: Nance Pear., NP  Grace Isaac, MD    ICD-9-CM ICD-10-CM   1. 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 Patient reports left foot pain at night. He reports a dry cough in the morning upon waking up. He denies hemoptysis. He reports pneumonia in September. Patient notes he has an inhaler; however, he has not used it recently. He denies fatigue, and reports a good energy level.  ALLERGIES:  is allergic to iodine.  Meds: Current Outpatient Prescriptions  Medication Sig Dispense Refill  . aspirin EC 81 MG tablet Take 81 mg by mouth daily.    . meloxicam (MOBIC) 7.5 MG tablet TAKE 1 TABLET (7.5 MG TOTAL) BY MOUTH DAILY. 30 tablet 2  . omeprazole (PRILOSEC) 40 MG capsule TAKE 1 CAPSULE (40 MG TOTAL) BY MOUTH DAILY. 90 capsule 0  . pravastatin (PRAVACHOL) 80 MG tablet TAKE 1 TABLET (80 MG TOTAL) BY MOUTH DAILY. 90 tablet 1   No current facility-administered medications for this encounter.     Physical Findings: The patient is in no acute distress. Patient is alert and oriented.  height is '5\' 9"'$  (1.753 m) and weight is 173 lb 3.2 oz (78.6 kg). His oral temperature is 97.5 F (36.4 C). His blood pressure is 123/82 and his pulse is 58 (abnormal). His oxygen saturation is 99%.  Lungs are clear to auscultation bilaterally. Heart has regular rate and rhythm. No palpable cervical, supraclavicular, or axillary adenopathy.  Lab Findings: Lab Results  Component Value Date   WBC 10.2  08/17/2015   HGB 14.8 08/17/2015   HCT 44.4 08/17/2015   MCV 91.9 08/17/2015   PLT 276 08/17/2015    Radiographic Findings: Ct Chest Wo Contrast  Result Date: 10/01/2016 CLINICAL DATA:  Patient with history of left lung carcinoma status post radiation therapy. EXAM: CT CHEST WITHOUT CONTRAST TECHNIQUE: Multidetector CT imaging of the chest was performed following the standard protocol without IV contrast. COMPARISON:  CT chest 05/22/2016. FINDINGS: Cardiovascular: Normal heart size. Trace pericardial effusion. Coronary arterial vascular calcifications. Ascending and descending thoracic aortic vascular calcifications. Mediastinum/Nodes: No enlarged axillary, mediastinal or hilar lymphadenopathy. Stable 7 mm right peritracheal lymph node (image 58; series 2). Large hiatal hernia. Lungs/Pleura: Central airways are patent. There is narrowing of the left upper and left lower lobe bronchi centrally. When compared to recent prior CT there appears be increased soft tissue about the central airways within the left hemi thorax. No definite definable mass is identified. Additionally there is increased patchy consolidation throughout the left upper lobe and superior portion of the left lower lobe. Interval increase in size of moderate layering left pleural effusion. The right lung is clear. No pneumothorax. Upper Abdomen: Grossly unchanged low-attenuation hepatic lesions, incompletely characterized however favored to represent small cysts. Normal adrenal glands. Musculoskeletal: Thoracic spine degenerative changes. No aggressive or acute appearing osseous lesions. IMPRESSION: Increased peribronchial soft tissue within the left hemi thorax centrally which may represent progressive radiation change. This is slightly more than  expected given the interval time coarse. Consider further evaluation with PET-CT or short-term follow-up chest CT to exclude the possibility of underlying recurrence. Increased irregular  consolidation within the left upper lobe which is likely secondary to radiation change. Progressive disease is felt to be less likely however not excluded. Recommend attention on follow-up exam or PET-CT. Aortic atherosclerosis. Electronically Signed   By: Lovey Newcomer M.D.   On: 10/01/2016 08:35    Impression:  The patient is doing well clinically. No evidence of recurrence on clinical exam. Chest CT scan questioned possible recurrence, but in light of his recent pneumonia, this is likely explaining the changes.  Plan: Follow up with radiation oncology in 3 months following chest CT scan.   ____________________________________ -----------------------------------  Blair Promise, PhD, MD  This document serves as a record of services personally performed by Gery Pray, MD. It was created on his behalf by Bethann Humble, a trained medical scribe. The creation of this record is based on the scribe's personal observations and the provider's statements to them. This document has been checked and approved by the attending provider.

## 2016-10-17 ENCOUNTER — Other Ambulatory Visit: Payer: Self-pay | Admitting: Family

## 2016-10-17 MED FILL — OMEPRAZOLE DR 40 MG CAPSULE: 40 | 90 days supply | Qty: 90 | Fill #0

## 2016-10-17 MED FILL — MELOXICAM 7.5 MG TABLET: 7.5 | 30 days supply | Qty: 30 | Fill #1

## 2016-11-25 MED FILL — PRAVASTATIN SODIUM 80 MG TA: 80 | 90 days supply | Qty: 90 | Fill #1

## 2017-01-03 ENCOUNTER — Telehealth: Payer: Self-pay | Admitting: *Deleted

## 2017-01-03 NOTE — Telephone Encounter (Signed)
CALLED PATIENT TO INFORM OF CT ON 01-15-17- ARRIVAL TIME - 3 PM @ [facility] AND HIS FU WITH DR. KINARD ON 01-16-17, SPOKE WITH PATIENT'S WIFE CATHY AND SHE IS AWARE OF THESE APPTS.

## 2017-01-13 ENCOUNTER — Other Ambulatory Visit: Payer: Self-pay | Admitting: Family

## 2017-01-15 ENCOUNTER — Ambulatory Visit (HOSPITAL_BASED_OUTPATIENT_CLINIC_OR_DEPARTMENT_OTHER)
Admission: RE | Admit: 2017-01-15 | Discharge: 2017-01-15 | Disposition: A | Discharge status: Home / Self Care | Payer: Medicare Other | Source: Ambulatory Visit | Attending: Radiation Oncology | Admitting: Radiation Oncology

## 2017-01-15 DIAGNOSIS — C3482 Malignant neoplasm of overlapping sites of left bronchus and lung: Secondary | ICD-10-CM | POA: Insufficient documentation

## 2017-01-16 ENCOUNTER — Ambulatory Visit: Payer: Self-pay | Admitting: Radiation Oncology

## 2017-01-23 ENCOUNTER — Ambulatory Visit: Payer: Medicare Other | Admitting: Radiation Oncology

## 2017-01-24 MED FILL — MELOXICAM 7.5 MG TABLET: 7.5 | 30 days supply | Qty: 30 | Fill #2

## 2017-01-24 MED FILL — OMEPRAZOLE DR 40 MG CAPSULE: 40 | 30 days supply | Qty: 30 | Fill #0

## 2017-01-27 ENCOUNTER — Encounter: Payer: Self-pay | Admitting: Radiation Oncology

## 2017-01-27 ENCOUNTER — Ambulatory Visit
Admission: RE | Admit: 2017-01-27 | Discharge: 2017-01-27 | Disposition: A | Discharge status: Home / Self Care | Payer: Medicare Other | Source: Ambulatory Visit | Attending: Radiation Oncology | Admitting: Radiation Oncology

## 2017-01-27 VITALS — BP 131/78 | HR 61 | Temp 97.6°F | Ht 69.0 in | Wt 168.8 lb

## 2017-01-27 DIAGNOSIS — R5383 Other fatigue: Secondary | ICD-10-CM | POA: Diagnosis not present

## 2017-01-27 DIAGNOSIS — Z888 Allergy status to other drugs, medicaments and biological substances status: Secondary | ICD-10-CM | POA: Insufficient documentation

## 2017-01-27 DIAGNOSIS — Z7982 Long term (current) use of aspirin: Secondary | ICD-10-CM | POA: Insufficient documentation

## 2017-01-27 DIAGNOSIS — C3482 Malignant neoplasm of overlapping sites of left bronchus and lung: Secondary | ICD-10-CM | POA: Insufficient documentation

## 2017-01-27 DIAGNOSIS — C3432 Malignant neoplasm of lower lobe, left bronchus or lung: Secondary | ICD-10-CM

## 2017-01-27 DIAGNOSIS — R0602 Shortness of breath: Secondary | ICD-10-CM | POA: Insufficient documentation

## 2017-01-27 NOTE — Progress Notes (Signed)
Radiation Oncology         [phone] ________________________________  Name: Robert Bates MRN: [id number]  Date: 01/27/2017  DOB: 05-19-1941  Follow-Up Visit Note  CC: Nance Pear., NP  Grace Isaac, MD    ICD-9-CM ICD-10-CM   1. Malignant neoplasm of overlapping sites of left lung (HCC) 162.8 C34.82   2. 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 Patient denies pain. He notes shortness of breath/wheezing when he lays on his left side. He notes this started post radiation. He reports an occasional dry cough. He notes a good appetite and fatigue.  ALLERGIES:  is allergic to iodine.  Meds: Current Outpatient Prescriptions  Medication Sig Dispense Refill  . aspirin EC 81 MG tablet Take 81 mg by mouth daily.    . meloxicam (MOBIC) 7.5 MG tablet TAKE 1 TABLET (7.5 MG TOTAL) BY MOUTH DAILY. (Patient not taking: Reported on 01/27/2017) 30 tablet 2  . omeprazole (PRILOSEC) 40 MG capsule TAKE 1 CAPSULE (40 MG TOTAL) BY MOUTH DAILY. (Patient not taking: Reported on 01/27/2017) 30 capsule 0  . pravastatin (PRAVACHOL) 80 MG tablet TAKE 1 TABLET (80 MG TOTAL) BY MOUTH DAILY. (Patient not taking: Reported on 01/27/2017) 90 tablet 1   No current facility-administered medications for this encounter.     Physical Findings: The patient is in no acute distress. Patient is alert and oriented.  height is '5\' 9"'$  (1.753 m) and weight is 168 lb 12.8 oz (76.6 kg). His oral temperature is 97.6 F (36.4 C). His blood pressure is 131/78 and his pulse is 61. His oxygen saturation is 96%.  Mildly decreased breath sounds in the left lung field. Right lung is clear to auscultation.  Heart has regular rate and rhythm. No palpable cervical, supraclavicular, or axillary adenopathy.  Lab Findings: Lab Results  Component Value Date   WBC 10.2 08/17/2015   HGB 14.8 08/17/2015   HCT 44.4 08/17/2015   MCV 91.9 08/17/2015   PLT 276 08/17/2015    Radiographic Findings: Ct Chest Wo Contrast  Result Date: 01/15/2017 CLINICAL DATA:  Followup lung cancer radiation therapy. Increasing short breath. EXAM: CT CHEST WITHOUT CONTRAST TECHNIQUE: Multidetector CT imaging of the chest was performed following the standard protocol without IV contrast. COMPARISON:  CT 09/30/2016 FINDINGS: Cardiovascular: Coronary artery calcification and aortic atherosclerotic calcification. Mediastinum/Nodes: No axillary or supraclavicular lymphadenopathy. Enlarged 10 mm RIGHT lower paratracheal lymph node it is unchanged comparison exam small AP window nodes are also stable. Moderate size hiatal hernia. Lungs/Pleura: Perihilar consolidation in the LEFT lung extending to the pleural surface with central bronchograms consists with radiation change. The degree of consolidation density is increased (image 55, series 3). No is measurable nodularity. Pleural-parenchymal thickening at the RIGHT lung apex is also unchanged. The RIGHT lung nodules. Stable small LEFT effusion. Upper Abdomen: Adrenal glands normal Musculoskeletal: No acute osseous abnormality. IMPRESSION: 1. Continued interval increase in density of left perihilar thickening extending to the pleural surface of the LEFT upper lobe. No discrete nodularity. Differential would include progressive of radiation change versus tumor recurrence. Given the progressive nature, consider FDG PET CT scan for evaluation. 2. Stable small LEFT. Electronically Signed  By: Suzy Bouchard M.D.   On: 01/15/2017 15:26    Impression:  The patient is doing well clinically. No evidence of recurrence on clinical exam. However, recent chest CT scan is concerning for possible  recurrence.  Plan: Order a PET scan to determine possible recurrence. Follow up with radiation oncology in 3 months assuming the PET scan is clear.  ____________________________________ -----------------------------------  Blair Promise, PhD, MD  This document serves as a record of services personally performed by Gery Pray, MD. It was created on his behalf by Bethann Humble, a trained medical scribe. The creation of this record is based on the scribe's personal observations and the provider's statements to them. This document has been checked and approved by the attending provider.

## 2017-01-27 NOTE — Progress Notes (Signed)
Robert Bates is here for follow up after treatment to his left lower lobe.  He denies having pain.  He reports having shortness of breath/wheezing when he lays on his left side.  He said this started after radiation.  He reports having occasional dry cough. He reports having a good appetite.  He does report having fatigue.    BP 131/78 (BP Location: Left Arm, Patient Position: Sitting)   Pulse 61   Temp 97.6 F (36.4 C) (Oral)   Ht '5\' 9"'$  (1.753 m)   Wt 168 lb 12.8 oz (76.6 kg)   SpO2 96%   BMI 24.93 kg/m    Wt Readings from Last 3 Encounters:  01/27/17 168 lb 12.8 oz (76.6 kg)  10/14/16 173 lb 3.2 oz (78.6 kg)  08/26/16 173 lb 3.2 oz (78.6 kg)

## 2017-01-28 ENCOUNTER — Telehealth: Payer: Self-pay | Admitting: *Deleted

## 2017-01-28 NOTE — Telephone Encounter (Signed)
CALLED PATIENT TO INFORM OF PET SCAN ON 01-30-17 - ARRIVAL TIME - 6:30 AM , PT. TO BE NPO AFTER MIDNIGHT @ [facility], LVM FOR A RETURN CALL

## 2017-01-30 ENCOUNTER — Encounter (HOSPITAL_COMMUNITY): Payer: Medicare Other

## 2017-01-31 ENCOUNTER — Other Ambulatory Visit: Payer: Self-pay | Admitting: Family Medicine

## 2017-01-31 ENCOUNTER — Encounter (HOSPITAL_COMMUNITY)
Admission: RE | Admit: 2017-01-31 | Discharge: 2017-01-31 | Disposition: A | Discharge status: Home / Self Care | Payer: Medicare Other | Source: Ambulatory Visit | Attending: Radiation Oncology | Admitting: Radiation Oncology

## 2017-01-31 DIAGNOSIS — C3482 Malignant neoplasm of overlapping sites of left bronchus and lung: Secondary | ICD-10-CM | POA: Insufficient documentation

## 2017-01-31 LAB — GLUCOSE, CAPILLARY: Glucose-Capillary: 95 mg/dL (ref 65–99)

## 2017-01-31 MED ORDER — FLUDEOXYGLUCOSE F - 18 (FDG) INJECTION
8.4300 | Freq: Once | INTRAVENOUS | Status: AC | PRN
  Administered 2017-01-31: 8.43 via INTRAVENOUS

## 2017-01-31 MED ORDER — FLUDEOXYGLUCOSE F - 18 (FDG) INJECTION: 8.4000 | Freq: Once | INTRAVENOUS | Status: DC | PRN

## 2017-03-04 ENCOUNTER — Other Ambulatory Visit: Payer: Self-pay | Admitting: Family

## 2017-03-04 MED ORDER — OMEPRAZOLE 40 MG PO CPDR: 40.0000 mg | DELAYED_RELEASE_CAPSULE | Freq: Every day | ORAL | 0 refills | Status: DC

## 2017-03-04 MED ORDER — PRAVASTATIN SODIUM 80 MG PO TABS: 80.0000 mg | ORAL_TABLET | Freq: Every day | ORAL | 0 refills | Status: DC

## 2017-03-04 MED FILL — PRAVASTATIN SODIUM 80 MG TA: 80 | 14 days supply | Qty: 14 | Fill #0

## 2017-03-04 MED FILL — OMEPRAZOLE DR 40 MG CAPSULE: 40 | 14 days supply | Qty: 14 | Fill #0

## 2017-03-04 NOTE — Telephone Encounter (Signed)
Caller name:  Hires,Cathy Relation to pt: spouse  Call back number: [phone] Pharmacy: [facility] [place]     Reason for call:  Spouse sent mychart message requesting the following medication  meloxicam (MOBIC) 7.5 MG tablet, omeprazole (PRILOSEC) 40 MG capsule, pravastatin (PRAVACHOL) 80 MG tablet

## 2017-03-04 NOTE — Telephone Encounter (Signed)
Robert Bates-- pravastatin and omeprazole refills have been sent. Please advise request for 90 day supply of Meloxicam?

## 2017-03-05 MED ORDER — MELOXICAM 7.5 MG PO TABS: ORAL_TABLET | ORAL | 0 refills | Status: DC

## 2017-03-05 MED FILL — MELOXICAM 7.5 MG TABLET: 7.5 | 90 days supply | Qty: 90 | Fill #0

## 2017-03-05 NOTE — Telephone Encounter (Signed)
Rx sent. Notified pt of below and scheduled f/u for 03/12/17 at 9:30am.

## 2017-03-05 NOTE — Telephone Encounter (Signed)
Ok to send 90 day supply of meloxicam.

## 2017-03-05 NOTE — Addendum Note (Signed)
Addended by: Kelle Darting A on: 03/05/2017 09:24 AM   Modules accepted: Orders

## 2017-03-12 ENCOUNTER — Ambulatory Visit: Payer: Medicare Other | Admitting: Family

## 2017-03-14 ENCOUNTER — Ambulatory Visit (INDEPENDENT_AMBULATORY_CARE_PROVIDER_SITE_OTHER): Payer: Medicare Other | Admitting: Family

## 2017-03-14 ENCOUNTER — Encounter: Payer: Self-pay | Admitting: Family

## 2017-03-14 VITALS — BP 147/72 | HR 59 | Temp 97.8°F | Resp 16 | Ht 69.0 in | Wt 170.4 lb

## 2017-03-14 DIAGNOSIS — I10 Essential (primary) hypertension: Secondary | ICD-10-CM | POA: Diagnosis not present

## 2017-03-14 DIAGNOSIS — E785 Hyperlipidemia, unspecified: Secondary | ICD-10-CM | POA: Diagnosis not present

## 2017-03-14 DIAGNOSIS — R062 Wheezing: Secondary | ICD-10-CM | POA: Diagnosis not present

## 2017-03-14 DIAGNOSIS — M549 Dorsalgia, unspecified: Secondary | ICD-10-CM | POA: Diagnosis not present

## 2017-03-14 DIAGNOSIS — K219 Gastro-esophageal reflux disease without esophagitis: Secondary | ICD-10-CM | POA: Diagnosis not present

## 2017-03-14 LAB — LIPID PANEL
Cholesterol: 146 mg/dL (ref 0–200)
HDL: 49.8 mg/dL (ref >39.00)
LDL Cholesterol: 64 mg/dL (ref 0–99)
NonHDL: 96.6
Total CHOL/HDL Ratio: 3
Triglycerides: 162 mg/dL — ABNORMAL HIGH (ref 0.0–149.0)
VLDL: 32.4 mg/dL (ref 0.0–40.0)

## 2017-03-14 MED ORDER — PREDNISONE 10 MG PO TABS: ORAL_TABLET | ORAL | 0 refills | Status: DC

## 2017-03-14 MED ORDER — ALBUTEROL SULFATE HFA 108 (90 BASE) MCG/ACT IN AERS: 2.0000 | INHALATION_SPRAY | Freq: Four times a day (QID) | RESPIRATORY_TRACT | 1 refills | Status: DC | PRN

## 2017-03-14 MED ORDER — PRAVASTATIN SODIUM 80 MG PO TABS: 80.0000 mg | ORAL_TABLET | Freq: Every day | ORAL | 5 refills | Status: DC

## 2017-03-14 MED ORDER — OMEPRAZOLE 40 MG PO CPDR: 40.0000 mg | DELAYED_RELEASE_CAPSULE | Freq: Every day | ORAL | 5 refills | Status: DC

## 2017-03-14 MED ORDER — BUDESONIDE-FORMOTEROL FUMARATE 160-4.5 MCG/ACT IN AERO: 2.0000 | INHALATION_SPRAY | Freq: Two times a day (BID) | RESPIRATORY_TRACT | 3 refills | Status: DC

## 2017-03-14 MED FILL — PROAIR HFA 90 MCG INHALER: 108 (90 BAS | 25 days supply | Qty: 9 | Fill #0

## 2017-03-14 MED FILL — predniSONE 10 MG TABS: 10 | 8 days supply | Qty: 20 | Fill #0

## 2017-03-14 MED FILL — SYMBICORT 160-4.5 MCG INH: 160-4.5 | 30 days supply | Qty: 10 | Fill #0

## 2017-03-14 NOTE — Assessment & Plan Note (Signed)
BP Readings from Last 3 Encounters:  03/14/17 (!) 147/72  01/27/17 131/78  10/14/16 123/82   BP acceptable for his age. Not on antihypertensive. Monitor.

## 2017-03-14 NOTE — Assessment & Plan Note (Signed)
Stable on PPI. Continue same.

## 2017-03-14 NOTE — Progress Notes (Signed)
Subjective:    Patient ID: Robert Bates, male    DOB: 07-10-1941, 76 y.o.   MRN: [id number]  HPI  Robert Bates is a 76 yr old male who presents today for follow up.   Hyperlipidemia- maintained on pravastatin 80 mg.  Lab Results  Component Value Date   CHOL 145 06/19/2015   HDL 46.90 06/19/2015   LDLCALC 62 06/19/2015   LDLDIRECT 142.2 12/26/2008   TRIG 180.0 (H) 06/19/2015   CHOLHDL 3 06/19/2015   Lung Cancer-(non small cell lung cancer)- continues to follow up with Dr. Sondra Come (radiation oncology).   Neck pain/back pain- pt reports + pain in the left side of his neck and left upper back. Woke up with it this AM.   GERD-  Reports stable on PPI.   Review of Systems See HPI  Past Medical History:  Diagnosis Date  . Alcoholism ([place])    past problem per pt but admits to still drinking - unclear how much  . Anxiety   . Arthritis   . 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  [name] GERD (gastroesophageal reflux disease)   . Hematuria    refuses work up or referral - understands risks of morbidity / mortality - 11/2008, 12/2008  . History of hiatal hernia   . Hyperlipemia   . Kidney cyst, acquired   . Kidney stones   . Meningioma ([place]) 10/25/2013   Follows with Dr. [name].   . Peripheral vascular disease ([place])    Abdominal Aortic Aneursym  . Pneumonia    as a child  . Radiation 09/18/15-10/25/15   left lower lobe 70.2 Gy  . Tobacco abuse      Social History   Social History  . Marital status: Married    Spouse name: N/A  . Number of children: 2  . Years of education: N/A   Occupational History  . Retired Scientist, research (medical)  .  [employer]   Social History Main Topics  . Smoking status: Former Smoker    Packs/day: 1.00    Years: 57.00    Quit date: 08/08/2015  . Smokeless tobacco: Former Systems developer    Types: Chew    Quit date: 11/04/1958     Comment: Started smoking  at age 9.  Currently smoking 1 ppd.  . Alcohol use 0.0 oz/week     Comment: Pt reports rare use  . Drug use: No  . Sexual activity: Not on file   Other Topics Concern  . Not on file   Social History Narrative  . No narrative on file    Past Surgical History:  Procedure Laterality Date  . COLONOSCOPY    . EYE SURGERY Bilateral    Cataracts removed w/ lens implant  . HERNIA REPAIR    . TONSILLECTOMY    . VIDEO BRONCHOSCOPY Bilateral 07/26/2015   Procedure: VIDEO BRONCHOSCOPY WITH FLUORO;  Surgeon: [name], MD;  Location: [facility];  Service: Cardiopulmonary;  Laterality: Bilateral;  . VIDEO BRONCHOSCOPY WITH ENDOBRONCHIAL NAVIGATION N/A 08/23/2015   Procedure: VIDEO BRONCHOSCOPY WITH ENDOBRONCHIAL NAVIGATION;  Surgeon: Grace Isaac, MD;  Location: [facility];  Service: Thoracic;  Laterality: N/A;  . VIDEO BRONCHOSCOPY WITH ENDOBRONCHIAL ULTRASOUND N/A 08/23/2015   Procedure: VIDEO BRONCHOSCOPY WITH ENDOBRONCHIAL ULTRASOUND;  Surgeon: Grace Isaac, MD;  Location: [facility];  Service: Thoracic;  Laterality: N/A;    Family  History  Problem Relation Age of Onset  . Leukemia Father   . Emphysema Father   . Learning disabilities Son   . Leukemia Other   . Stroke Other     Allergies  Allergen Reactions  . Iodine     REACTION: neck swells    Current Outpatient Prescriptions on File Prior to Visit  Medication Sig Dispense Refill  . aspirin EC 81 MG tablet Take 81 mg by mouth daily.    . meloxicam (MOBIC) 7.5 MG tablet TAKE 1 TABLET (7.5 MG TOTAL) BY MOUTH DAILY. 90 tablet 0  . omeprazole (PRILOSEC) 40 MG capsule Take 1 capsule (40 mg total) by mouth daily. PATIENT NEEDS OFFICE VISIT FOR FURTHER REFILLS 14 capsule 0  . pravastatin (PRAVACHOL) 80 MG tablet Take 1 tablet (80 mg total) by mouth daily. 14 tablet 0   No current facility-administered medications on file prior to visit.     BP (!) 147/72 (BP Location: Right Arm, Cuff Size: Normal)   Pulse (!) 59   Temp  97.8 F (36.6 C) (Oral)   Resp 16   Ht '5\' 9"'$  (1.753 m)   Wt 170 lb 6.4 oz (77.3 kg)   SpO2 99% Comment: room air  BMI 25.16 kg/m       Objective:   Physical Exam  Constitutional: He is oriented to person, place, and time. He appears well-developed and well-nourished. No distress.  HENT:  Head: Normocephalic and atraumatic.  Cardiovascular: Normal rate and regular rhythm.   No murmur heard. Pulmonary/Chest: Effort normal. No respiratory distress. He has wheezes in the left middle field. He has no rales.  Musculoskeletal: He exhibits no edema.  Neurological: He is alert and oriented to person, place, and time.  Skin: Skin is warm and dry.  Psychiatric: He has a normal mood and affect. His behavior is normal. Thought content normal.          Assessment & Plan:  Back pain- trial of pred taper.  Wheezing- likely due to underlying COPD/lung CA and radiation. Will begin symbicort, prn albuterol, pred taper.

## 2017-03-14 NOTE — Assessment & Plan Note (Signed)
Obtain lipid panel today. Continue simvastatin.

## 2017-03-14 NOTE — Patient Instructions (Addendum)
Begin prednisone for upper back pain and wheezing. Add symbicort twice daily for your breathing. You may use albuterol as needed for cough/wheezing. Call if symptoms worsen or if symptoms fail to improve.  Complete lab work prior to leaving.

## 2017-03-17 ENCOUNTER — Encounter: Payer: Self-pay | Admitting: Family

## 2017-03-17 MED FILL — OMEPRAZOLE DR 40 MG CAPSULE: 40 | 30 days supply | Qty: 30 | Fill #0

## 2017-03-17 MED FILL — PRAVASTATIN SODIUM 80 MG TA: 80 | 30 days supply | Qty: 30 | Fill #0

## 2017-03-24 ENCOUNTER — Ambulatory Visit (INDEPENDENT_AMBULATORY_CARE_PROVIDER_SITE_OTHER): Payer: Medicare Other | Admitting: Family

## 2017-03-24 ENCOUNTER — Ambulatory Visit (HOSPITAL_BASED_OUTPATIENT_CLINIC_OR_DEPARTMENT_OTHER)
Admission: RE | Admit: 2017-03-24 | Discharge: 2017-03-24 | Disposition: A | Discharge status: Home / Self Care | Payer: Medicare Other | Source: Ambulatory Visit | Attending: Family | Admitting: Family

## 2017-03-24 ENCOUNTER — Encounter: Payer: Self-pay | Admitting: Family

## 2017-03-24 VITALS — BP 102/72 | HR 77 | Temp 98.0°F | Resp 16 | Ht 69.0 in | Wt 171.2 lb

## 2017-03-24 DIAGNOSIS — R918 Other nonspecific abnormal finding of lung field: Secondary | ICD-10-CM | POA: Diagnosis not present

## 2017-03-24 DIAGNOSIS — R079 Chest pain, unspecified: Secondary | ICD-10-CM | POA: Insufficient documentation

## 2017-03-24 DIAGNOSIS — C3492 Malignant neoplasm of unspecified part of left bronchus or lung: Secondary | ICD-10-CM

## 2017-03-24 MED ORDER — TRAMADOL HCL 50 MG PO TABS: 50.0000 mg | ORAL_TABLET | Freq: Four times a day (QID) | ORAL | 0 refills | Status: DC | PRN

## 2017-03-24 MED ORDER — TRAMADOL HCL 50 MG PO TABS: 50.0000 mg | ORAL_TABLET | Freq: Three times a day (TID) | ORAL | 0 refills | Status: DC | PRN

## 2017-03-24 NOTE — Patient Instructions (Addendum)
Please complete chest x-ray on the first floor.   You may use tramadol as needed for pain. Do not drive after taking. We will work on getting CT scheduled for you tomorrow.

## 2017-03-24 NOTE — Progress Notes (Signed)
Subjective:    Patient ID: Robert Bates, male    DOB: Aug 02, 1941, 76 y.o.   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 Reports associated SOB.  Pain is worse with deep breath.  He was initially evaluated for back pain on 03/14/17. We gave him a medrol dose pak and also began symbicort due to his wheezing.  He reports that medrol dose pak might have helped for a day or two but then symptoms returned.  Reports that he began symbicort as instructed but this does not seem to be helping his shortness of breath. He is requesting something for the pain, "I can't take this pain." He is accompanied by his wife.   Review of Systems See HPI  Past Medical History:  Diagnosis Date  . Alcoholism ([place])    past problem per pt but admits to still drinking - unclear how much  . Anxiety   . Arthritis   . 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  [name] GERD (gastroesophageal reflux disease)   . Hematuria    refuses work up or referral - understands risks of morbidity / mortality - 11/2008, 12/2008  . History of hiatal hernia   . Hyperlipemia   . Kidney cyst, acquired   . Kidney stones   . Meningioma ([place]) 10/25/2013   Follows with Dr. [name].   . Peripheral vascular disease ([place])    Abdominal Aortic Aneursym  . Pneumonia    as a child  . Radiation 09/18/15-10/25/15   left lower lobe 70.2 Gy  . Tobacco abuse      Social History   Social History  . Marital status: Married    Spouse name: N/A  . Number of children: 2  . Years of education: N/A   Occupational History  . Retired Scientist, research (medical)  .  [employer]   Social History Main Topics  . Smoking status: Former  Smoker    Packs/day: 1.00    Years: 57.00    Quit date: 08/08/2015  . Smokeless tobacco: Former Systems developer    Types: Chew    Quit date: 11/04/1958     Comment: Started smoking at age 52.  Currently smoking 1 ppd.  . Alcohol use 0.0 oz/week     Comment: Pt reports rare use  . Drug use: No  . Sexual activity: Not on file   Other Topics Concern  . Not on file   Social History Narrative  . No narrative on file    Past Surgical History:  Procedure Laterality Date  . COLONOSCOPY    . EYE SURGERY Bilateral    Cataracts removed w/ lens implant  . HERNIA REPAIR    . TONSILLECTOMY    . VIDEO BRONCHOSCOPY Bilateral 07/26/2015   Procedure: VIDEO BRONCHOSCOPY WITH FLUORO;  Surgeon: [name], MD;  Location: [facility];  Service: Cardiopulmonary;  Laterality: Bilateral;  . VIDEO BRONCHOSCOPY WITH ENDOBRONCHIAL NAVIGATION N/A 08/23/2015   Procedure: VIDEO BRONCHOSCOPY WITH ENDOBRONCHIAL NAVIGATION;  Surgeon: Grace Isaac, MD;  Location: [facility];  Service: Thoracic;  Laterality: N/A;  . VIDEO BRONCHOSCOPY WITH ENDOBRONCHIAL  ULTRASOUND N/A 08/23/2015   Procedure: VIDEO BRONCHOSCOPY WITH ENDOBRONCHIAL ULTRASOUND;  Surgeon: Grace Isaac, MD;  Location: [facility] OR;  Service: Thoracic;  Laterality: N/A;    Family History  Problem Relation Age of Onset  . Leukemia Father   . Emphysema Father   . Learning disabilities Son   . Leukemia Other   . Stroke Other     Allergies  Allergen Reactions  . Iodine     REACTION: neck swells    Current Outpatient Prescriptions on File Prior to Visit  Medication Sig Dispense Refill  . albuterol (PROVENTIL HFA;VENTOLIN HFA) 108 (90 Base) MCG/ACT inhaler Inhale 2 puffs into the lungs every 6 (six) hours as needed for wheezing or shortness of breath. 1 Inhaler 1  . aspirin EC 81 MG tablet Take 81 mg by mouth daily.    . budesonide-formoterol (SYMBICORT) 160-4.5 MCG/ACT inhaler Inhale 2 puffs into the lungs 2 (two) times daily. 1 Inhaler 3  . meloxicam  (MOBIC) 7.5 MG tablet TAKE 1 TABLET (7.5 MG TOTAL) BY MOUTH DAILY. 90 tablet 0  . omeprazole (PRILOSEC) 40 MG capsule Take 1 capsule (40 mg total) by mouth daily. PATIENT NEEDS OFFICE VISIT FOR FURTHER REFILLS 30 capsule 5  . pravastatin (PRAVACHOL) 80 MG tablet Take 1 tablet (80 mg total) by mouth daily. 30 tablet 5   No current facility-administered medications on file prior to visit.     BP 102/72 (BP Location: Right Arm, Cuff Size: Normal)   Pulse 77   Temp 98 F (36.7 C) (Oral)   Resp 16   Ht '5\' 9"'$  (1.753 m)   Wt 171 lb 3.2 oz (77.7 kg)   SpO2 98%   BMI 25.28 kg/m       Objective:   Physical Exam  Constitutional: He is oriented to person, place, and time. He appears well-developed and well-nourished. No distress.  HENT:  Head: Normocephalic and atraumatic.  Cardiovascular: Normal rate and regular rhythm.   No murmur heard. Pulmonary/Chest:  Mild dyspnea is noted.  + wheezes noted LUL  Musculoskeletal: He exhibits no edema.  Neurological: He is alert and oriented to person, place, and time.  Skin: Skin is warm and dry.  Psychiatric: He has a normal mood and affect. His behavior is normal. Thought content normal.          Assessment & Plan:  Pleuritic chest pain and SOB in the setting of known lung CA-  A chest x ray was performed to exclude pneumonia and noted overall persistent changes in the left upper lobe.  This was followed by a CT of the chest which noted:    "Stable peripheral opacity in LEFT upper lobe, potentially related to prior radiation therapy, recommend continued surveillance. Stable abnormal soft tissue at the LEFT hilum extending into the central LEFT lower lobe, question related to prior radiation therapy or tumor, recommend continued surveillance. Single upper normal sized RIGHT paratracheal node. Decrease in LEFT pleural effusion"  EKG is performed today and is personally reviewed. Notes NSR without acute changes.  Noted to have very mildly  shortened PR interval and a few PAC's.    Given the deterioration of his clinical status I am concerned for progression of his tumor.  I will share my concerns and thoughts with Dr. Mckinley Jewel his oncologist as well as Dr. Sondra Come his radiation oncologist.  I will also arrange follow up with his pulmonologist, Dr. Melvyn Novas to help with management of his pulmonary symptoms.  In the meantime, I have  supplied him with an rx for tramadol prn pain and he is advised not to drive after taking the tramadol.  Continue symbicort and prn albuterol.  He will be asked to return for CMET/CBC (could not be obtained at time of his visit due to lab being closed). Case was reviewed with Dr. Charlett Blake.

## 2017-03-25 ENCOUNTER — Encounter: Payer: Self-pay | Admitting: Family

## 2017-03-25 ENCOUNTER — Telehealth: Payer: Self-pay | Admitting: Family

## 2017-03-25 ENCOUNTER — Ambulatory Visit (HOSPITAL_BASED_OUTPATIENT_CLINIC_OR_DEPARTMENT_OTHER)
Admission: RE | Admit: 2017-03-25 | Discharge: 2017-03-25 | Disposition: A | Discharge status: Home / Self Care | Payer: Medicare Other | Source: Ambulatory Visit | Attending: Family | Admitting: Family

## 2017-03-25 DIAGNOSIS — R079 Chest pain, unspecified: Secondary | ICD-10-CM

## 2017-03-25 DIAGNOSIS — J90 Pleural effusion, not elsewhere classified: Secondary | ICD-10-CM | POA: Diagnosis not present

## 2017-03-25 DIAGNOSIS — J189 Pneumonia, unspecified organism: Secondary | ICD-10-CM

## 2017-03-25 DIAGNOSIS — I700 Atherosclerosis of aorta: Secondary | ICD-10-CM | POA: Insufficient documentation

## 2017-03-25 DIAGNOSIS — R918 Other nonspecific abnormal finding of lung field: Secondary | ICD-10-CM | POA: Diagnosis not present

## 2017-03-25 NOTE — Telephone Encounter (Signed)
Please contact patient and let him know that I reviewed his CT scan.  CT is again showing area that could be pneumonitis, but it is possible that it could be recurrence of tumor.  I would like to arrange follow up with his lung doctor (Dr. Melvyn Novas) and his oncologist (Dr. Mckinley Jewel) for further evaluation. Let me know if he has not been contacted about these appointments in 1 week.  I have also forwarded these results to Dr. Sondra Come.  In the meantime, I would like him to return to the lab for CMET, CBC to make sure he does not have any sign of infection.  He may use albuterol as needed for wheezing.  Is the tramadol helping his pain?

## 2017-03-26 ENCOUNTER — Other Ambulatory Visit (INDEPENDENT_AMBULATORY_CARE_PROVIDER_SITE_OTHER): Payer: Medicare Other

## 2017-03-26 ENCOUNTER — Telehealth: Payer: Self-pay | Admitting: Internal Medicine

## 2017-03-26 DIAGNOSIS — J189 Pneumonia, unspecified organism: Secondary | ICD-10-CM | POA: Diagnosis not present

## 2017-03-26 DIAGNOSIS — R079 Chest pain, unspecified: Secondary | ICD-10-CM

## 2017-03-26 MED ORDER — HYDROCODONE-ACETAMINOPHEN 5-325 MG PO TABS: 1.0000 | ORAL_TABLET | Freq: Four times a day (QID) | ORAL | 0 refills | Status: DC | PRN

## 2017-03-26 MED FILL — HYDROCODON-APAP 5-325: 5-325 | 5 days supply | Qty: 20 | Fill #0

## 2017-03-26 NOTE — Telephone Encounter (Signed)
-----   Message from Gery Pray, MD sent at 03/26/2017  8:13 AM EDT ----- Lenna Sciara,       Thanks for the update.  Sounds like a good plan.  Hoyle Barr ----- Message ----- From: [name], NP Sent: 03/25/2017   9:43 PM To: Gery Pray, MD  Dr. Sondra Come,  I wanted to touch base with you re: Mr. Schliep.   As you know, PET done in the end of march noted "low-level activity could be associated with radiation pneumonitis or early recurrence of tumor."  He presented to me recently with left sided wheezing, left pleuritic chest pain and SOB. This has not improved with oral steroids/inhalers.  I repeated a CT yesterday which notes:  "LEFT lower lobe, question related to prior radiation therapy or tumor."  I am suspicious that he is having progressive disease based on his clinical presentation. I am going to try to get him back in with Dr. Mckinley Jewel who he has not seen since the end of 2016 as well as a pulmonary consult to help manage his pulmonary symptoms/wheezing.    Thanks,  [name] NP

## 2017-03-26 NOTE — Telephone Encounter (Signed)
Notified pt when he came in for lab visit and he voices understanding.

## 2017-03-26 NOTE — Telephone Encounter (Signed)
Notified pt and spouse and they are agreeable to plan. Lab appt scheduled for today at 3pm; future orders entered. Pt reports that tramadol was helping his pain until this morning and he reports that it didn't seem to be as effective today.

## 2017-03-26 NOTE — Telephone Encounter (Signed)
D/c tramadol, can start hydrocodone prn. Don't drive after taking.

## 2017-03-26 NOTE — Telephone Encounter (Signed)
Referral states appt needed 1-2 weeks with Dr. Melvyn Novas...  Sched Instruct: Dr. Melvyn Novas- needs follow up appointment in next 1-2 weeks please. (already established)  Comment: 76 yr old male with lung CA, now having possible progression of his lung cancer, now having worsening wheezing/pleuritic symptoms. Please evaluate and treat.

## 2017-03-27 ENCOUNTER — Telehealth: Payer: Self-pay | Admitting: Internal Medicine

## 2017-03-27 LAB — CBC WITH DIFFERENTIAL/PLATELET
Basophils Absolute: 0.1 10*3/uL (ref 0.0–0.1)
Basophils Relative: 0.7 % (ref 0.0–3.0)
Eosinophils Absolute: 0.2 10*3/uL (ref 0.0–0.7)
Eosinophils Relative: 2 % (ref 0.0–5.0)
HCT: 43 % (ref 39.0–52.0)
Hemoglobin: 14 g/dL (ref 13.0–17.0)
Lymphocytes Relative: 12.3 % (ref 12.0–46.0)
Lymphs Abs: 1.3 10*3/uL (ref 0.7–4.0)
MCHC: 32.5 g/dL (ref 30.0–36.0)
MCV: 93.8 fl (ref 78.0–100.0)
Monocytes Absolute: 1.2 10*3/uL — ABNORMAL HIGH (ref 0.1–1.0)
Monocytes Relative: 11.1 % (ref 3.0–12.0)
Neutro Abs: 7.8 10*3/uL — ABNORMAL HIGH (ref 1.4–7.7)
Neutrophils Relative %: 73.9 % (ref 43.0–77.0)
Platelets: 221 10*3/uL (ref 150.0–400.0)
RBC: 4.58 Mil/uL (ref 4.22–5.81)
RDW: 15.1 % (ref 11.5–15.5)
WBC: 10.5 10*3/uL (ref 4.0–10.5)

## 2017-03-27 LAB — COMPREHENSIVE METABOLIC PANEL
ALT: 12 U/L (ref 0–53)
AST: 13 U/L (ref 0–37)
Albumin: 3.6 g/dL (ref 3.5–5.2)
Alkaline Phosphatase: 70 U/L (ref 39–117)
BUN: 20 mg/dL (ref 6–23)
CO2: 30 mEq/L (ref 19–32)
Calcium: 9.3 mg/dL (ref 8.4–10.5)
Chloride: 101 mEq/L (ref 96–112)
Creatinine, Ser: 0.77 mg/dL (ref 0.40–1.50)
GFR: 104.46 mL/min (ref >60.00)
Glucose, Bld: 117 mg/dL — ABNORMAL HIGH (ref 70–99)
Potassium: 4.4 mEq/L (ref 3.5–5.1)
Sodium: 137 mEq/L (ref 135–145)
Total Bilirubin: 0.4 mg/dL (ref 0.2–1.2)
Total Protein: 6.1 g/dL (ref 6.0–8.3)

## 2017-03-27 NOTE — Telephone Encounter (Signed)
Scheduled appt per sch message from Jim Like - patient aware of appt date and time.

## 2017-04-01 ENCOUNTER — Telehealth: Payer: Self-pay | Admitting: Internal Medicine

## 2017-04-01 ENCOUNTER — Ambulatory Visit (HOSPITAL_BASED_OUTPATIENT_CLINIC_OR_DEPARTMENT_OTHER): Payer: Medicare Other | Admitting: Internal Medicine

## 2017-04-01 ENCOUNTER — Encounter: Payer: Self-pay | Admitting: Internal Medicine

## 2017-04-01 VITALS — BP 121/69 | HR 54 | Temp 97.9°F | Resp 18 | Ht 69.0 in | Wt 167.6 lb

## 2017-04-01 DIAGNOSIS — C3412 Malignant neoplasm of upper lobe, left bronchus or lung: Secondary | ICD-10-CM

## 2017-04-01 DIAGNOSIS — C3432 Malignant neoplasm of lower lobe, left bronchus or lung: Secondary | ICD-10-CM

## 2017-04-01 NOTE — Telephone Encounter (Signed)
Add on to 04/03/17 pm schedule

## 2017-04-01 NOTE — Progress Notes (Signed)
[place] [phone]   [phone]  OFFICE PROGRESS NOTE  Robert Alar, NP [place] [id number]  DIAGNOSIS: Stage IIB (T3, N0, M0) non-small cell lung cancer, squamous cell carcinoma presented with left lower lobe endobronchial lesion as well as suspicious groundglass opacity in the left upper lobe diagnosed in September 2016.  PRIOR THERAPY: Curative radiotherapy to the left upper lobe lung mass under the care of Dr. Sondra Bates completed 10/25/2015.  CURRENT THERAPY: Observation  INTERVAL HISTORY: Robert Bates 76 y.o. male returns to the clinic today for follow-up visit accompanied by his wife. He was last seen 2 years ago. The patient is feeling fine today with no specific complaints except for intermittent chest pain relieved with pain medication. He denied having any shortness of breath, cough or hemoptysis. He has no fever or chills. He has no nausea, vomiting, diarrhea or constipation. He has no fever or chills. He had CT scan of the chest performed recently by his primary care nurse practitioner and she referred him back to the clinic today for evaluation and discussion of his treatment options.  MEDICAL HISTORY: Past Medical History:  Diagnosis Date  . Alcoholism ([facility])    past problem per pt but admits to still drinking - unclear how much  . Anxiety   . Arthritis   . 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  [name] GERD (gastroesophageal reflux disease)   . Hematuria    refuses work up or referral - understands risks of morbidity / mortality - 11/2008, 12/2008  . History of hiatal hernia   . Hyperlipemia   . Kidney cyst, acquired   . Kidney stones   . Meningioma (Robert [facility]) 10/25/2013   Follows with Dr. [name].   . Peripheral vascular disease (Robert [facility])    Abdominal Aortic Aneursym  . Pneumonia    as a child  . Radiation  09/18/15-10/25/15   left lower lobe 70.2 Gy  . Tobacco abuse     ALLERGIES:  is allergic to iodine.  MEDICATIONS:  Current Outpatient Prescriptions  Medication Sig Dispense Refill  . albuterol (PROVENTIL HFA;VENTOLIN HFA) 108 (90 Base) MCG/ACT inhaler Inhale 2 puffs into the lungs every 6 (six) hours as needed for wheezing or shortness of breath. 1 Inhaler 1  . aspirin EC 81 MG tablet Take 81 mg by mouth daily.    . budesonide-formoterol (SYMBICORT) 160-4.5 MCG/ACT inhaler Inhale 2 puffs into the lungs 2 (two) times daily. 1 Inhaler 3  . HYDROcodone-acetaminophen (NORCO/VICODIN) 5-325 MG tablet Take 1 tablet by mouth every 6 (six) hours as needed for moderate pain. 20 tablet 0  . meloxicam (MOBIC) 7.5 MG tablet TAKE 1 TABLET (7.5 MG TOTAL) BY MOUTH DAILY. 90 tablet 0  . omeprazole (PRILOSEC) 40 MG capsule Take 1 capsule (40 mg total) by mouth daily. PATIENT NEEDS OFFICE VISIT FOR FURTHER REFILLS 30 capsule 5  . pravastatin (PRAVACHOL) 80 MG tablet Take 1 tablet (80 mg total) by mouth daily. 30 tablet 5   No current facility-administered medications for this visit.     SURGICAL HISTORY:  Past Surgical History:  Procedure Laterality Date  . COLONOSCOPY    . EYE SURGERY Bilateral    Cataracts removed w/ lens implant  . HERNIA REPAIR    . TONSILLECTOMY    . VIDEO BRONCHOSCOPY Bilateral 07/26/2015   Procedure: VIDEO  BRONCHOSCOPY WITH FLUORO;  Surgeon: [name], MD;  Location: [name];  Service: Cardiopulmonary;  Laterality: Bilateral;  . VIDEO BRONCHOSCOPY WITH ENDOBRONCHIAL NAVIGATION N/A 08/23/2015   Procedure: VIDEO BRONCHOSCOPY WITH ENDOBRONCHIAL NAVIGATION;  Surgeon: Grace Isaac, MD;  Location: [facility];  Service: Thoracic;  Laterality: N/A;  . VIDEO BRONCHOSCOPY WITH ENDOBRONCHIAL ULTRASOUND N/A 08/23/2015   Procedure: VIDEO BRONCHOSCOPY WITH ENDOBRONCHIAL ULTRASOUND;  Surgeon: Grace Isaac, MD;  Location: [facility];  Service: Thoracic;  Laterality: N/A;    REVIEW  OF SYSTEMS:  A comprehensive review of systems was negative except for: Respiratory: positive for pleurisy/chest pain   PHYSICAL EXAMINATION: General appearance: alert, cooperative and no distress Head: Normocephalic, without obvious abnormality, atraumatic Neck: no adenopathy, no JVD, supple, symmetrical, trachea midline and thyroid not enlarged, symmetric, no tenderness/mass/nodules Lymph nodes: Cervical, supraclavicular, and axillary nodes normal. Resp: clear to auscultation bilaterally Back: symmetric, no curvature. ROM normal. No CVA tenderness. Cardio: regular rate and rhythm, S1, S2 normal, no murmur, click, rub or gallop GI: soft, non-tender; bowel sounds normal; no masses,  no organomegaly Extremities: extremities normal, atraumatic, no cyanosis or edema  ECOG PERFORMANCE STATUS: 1 - Symptomatic but completely ambulatory  Blood pressure 121/69, pulse (!) 54, temperature 97.9 F (36.6 C), temperature source Oral, resp. rate 18, height 5\' 9"  (1.753 m), weight 167 lb 9.6 oz (76 kg), SpO2 99 %.  LABORATORY DATA: Lab Results  Component Value Date   WBC 10.5 03/26/2017   HGB 14.0 03/26/2017   HCT 43.0 03/26/2017   MCV 93.8 03/26/2017   PLT 221.0 03/26/2017      Chemistry      Component Value Date/Time   NA 137 03/26/2017 1501   NA 140 08/17/2015 1319   K 4.4 03/26/2017 1501   K 4.8 08/17/2015 1319   CL 101 03/26/2017 1501   CO2 30 03/26/2017 1501   CO2 28 08/17/2015 1319   BUN 20 03/26/2017 1501   BUN 16.3 08/17/2015 1319   CREATININE 0.77 03/26/2017 1501   CREATININE 0.8 08/17/2015 1319      Component Value Date/Time   CALCIUM 9.3 03/26/2017 1501   CALCIUM 9.6 08/17/2015 1319   ALKPHOS 70 03/26/2017 1501   ALKPHOS 90 08/17/2015 1319   AST 13 03/26/2017 1501   AST 13 08/17/2015 1319   ALT 12 03/26/2017 1501   ALT 14 08/17/2015 1319   BILITOT 0.4 03/26/2017 1501   BILITOT 0.34 08/17/2015 1319       RADIOGRAPHIC STUDIES: Dg Chest 2 View  Result Date:  03/24/2017 CLINICAL DATA:  Left-sided chest pain EXAM: CHEST  2 VIEW COMPARISON:  01/15/2017 FINDINGS: Cardiac shadow is stable. Persistent changes in the left upper lobe are noted consistent with the patient's known history of prior lung carcinoma with treatment. The overall appearance appears improved from prior CT examination. Blunting of left costophrenic angle is noted. The right lung remains clear. No bony abnormality is seen. IMPRESSION: Improved but persistent changes in the left upper lobe. Electronically Signed   By: Inez Catalina M.D.   On: 03/24/2017 20:00   Ct Chest Wo Contrast  Result Date: 03/25/2017 CLINICAL DATA:  Increasing shortness of breath over past several weeks, history of lung cancer LEFT lower lobe post radiation therapy, peripheral vascular disease, GERD, hiatal hernia, former smoker EXAM: CT CHEST WITHOUT CONTRAST TECHNIQUE: Multidetector CT imaging of the chest was performed following the standard protocol without IV contrast. Sagittal and coronal MPR images reconstructed from axial data set. COMPARISON:  PET-CT  01/31/2017 FINDINGS: Cardiovascular: Atherosclerotic calcification aorta, proximal great vessels, and coronary arteries. 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 Small pericardial effusion. Mediastinum/Nodes: Visualized base of cervical region normal appearance. No definite thoracic adenopathy identified. Scattered normal sized mediastinal lymph nodes noted, largest a 9 mm short axis RIGHT paratracheal node at carina image 68, previously 8 mm, demonstrated FDG accumulation on prior PET-CT. Large hiatal hernia. Remainder of esophagus unremarkable. Lungs/Pleura: Abnormal opacity in the LEFT upper lobe laterally abutting the lateral chest wall is unchanged, 5.4 x 3.7 cm image 62. Additional abnormal LEFT perihilar density extending into central LEFT lower lobe unchanged. These changes could be related to radiation therapy. Marked narrowing of the LEFT  lower lobe bronchus with lesser degree of narrowing at the LEFT upper lobe bronchus. Persistent small LEFT pleural effusion, slightly decreased. 2 mm LEFT upper lobe nodule image 46. Minimal RIGHT apical scarring. Upper Abdomen: Multiple low-attenuation foci within the liver again identified, unchanged, demonstrated no abnormal FDG accumulation on prior PET-CT question cysts. LEFT adrenal thickening without discrete mass. Remaining visualized upper abdomen unremarkable. Musculoskeletal: No acute osseous findings. Sclerotic focus within the T8 vertebral body on RIGHT unchanged. IMPRESSION: Stable peripheral opacity in LEFT upper lobe, potentially related to prior radiation therapy, recommend continued surveillance. Stable abnormal soft tissue at the LEFT hilum extending into the central LEFT lower lobe, question related to prior radiation therapy or tumor, recommend continued surveillance. Single upper normal sized RIGHT paratracheal node. Decrease in LEFT pleural effusion. Probable liver cysts. Aortic atherosclerosis and coronary arterial calcification. Electronically Signed   By: Lavonia Dana M.D.   On: 03/25/2017 11:32    ASSESSMENT AND PLAN:  This is a very pleasant 76 years old white male with a stage IIB non-small cell lung cancer, squamous cell carcinoma presented with left upper lobe endobronchial lesion in addition to left upper lobe suspicious groundglass opacity. He is status post curative radiotherapy under the care of Dr. Sondra Bates. The patient is currently on observation and the recent CT scan of the chest showed no evidence for disease progression and there was a stable opacity in the left upper lobe as well as stable abnormal soft tissue in the left hilum. I discussed the scan results and showed the images to the patient and his wife. I recommended for him to continue in observation with repeat CT scan of the chest in 3 months for reevaluation of his disease. If he has any concerning findings for  progression, I would consider the patient for a PET scan and repeat biopsy. He was advised to call immediately if he has any concerning symptoms in the interval. The patient voices understanding of current disease status and treatment options and is in agreement with the current care plan. All questions were answered. The patient knows to call the clinic with any problems, questions or concerns. We can certainly see the patient much sooner if necessary. I spent 10 minutes counseling the patient face to face. The total time spent in the appointment was 15 minutes.  Disclaimer: This note was dictated with voice recognition software. Similar sounding words can inadvertently be transcribed and may not be corrected upon review.

## 2017-04-01 NOTE — Telephone Encounter (Signed)
Spoke with pt's spouse, in regards to scheduling OV. Robert [facility] states she will discuss with pt and will call us back to schedule. Will await call back.

## 2017-04-01 NOTE — Telephone Encounter (Signed)
Lab and follow up with Dr Julien Nordmann scheduled for 07/02/17, per 04/01/17 los. Date and time, per patient request. Patient was given a copy of the AVS report and appointment schedule per 04/01/17 los.

## 2017-04-01 NOTE — Telephone Encounter (Signed)
MW this pt was referred over to our office by [name], NP and wanted this pt to be seen by you in the next week or so.  You have no openings at this time  Please advise when we can work the pt in with you.  Thanks

## 2017-04-03 ENCOUNTER — Ambulatory Visit (INDEPENDENT_AMBULATORY_CARE_PROVIDER_SITE_OTHER): Payer: Medicare Other | Admitting: Internal Medicine

## 2017-04-03 ENCOUNTER — Encounter: Payer: Self-pay | Admitting: Internal Medicine

## 2017-04-03 VITALS — BP 138/70 | HR 60 | Ht 69.0 in | Wt 167.4 lb

## 2017-04-03 DIAGNOSIS — C3400 Malignant neoplasm of unspecified main bronchus: Secondary | ICD-10-CM | POA: Diagnosis not present

## 2017-04-03 DIAGNOSIS — J449 Chronic obstructive pulmonary disease, unspecified: Secondary | ICD-10-CM | POA: Insufficient documentation

## 2017-04-03 MED ORDER — BUDESONIDE-FORMOTEROL FUMARATE 160-4.5 MCG/ACT IN AERO: 2.0000 | INHALATION_SPRAY | Freq: Two times a day (BID) | RESPIRATORY_TRACT | 0 refills | Status: DC

## 2017-04-03 NOTE — Progress Notes (Signed)
Subjective:     Patient ID: Robert [facility], male   DOB: 06-Apr-1941,    MRN: [id number]  HPI   15 yowm quit smoking 08/2015 with no sob and no need for any inhalers referred to pulmonary clinic 04/03/2017 by Dr   Robert [facility]  DIAGNOSIS: Stage IIB (T3, N0, M0) non-small cell lung cancer, squamous cell carcinoma presented with left lower lobe endobronchial lesion (basilar segmental level)  as well as suspicious groundglass opacity in the left upper lobe diagnosed in September 2016.  PRIOR THERAPY: Curative radiotherapy to the left upper lobe lung mass under the care of Dr. Sondra Bates completed 10/25/2015.   04/03/2017 1st  Pulmonary office visit/ Robert Bates   Chief Complaint  Patient presents with  . Pulmonary Consult    Referred by Dr. Debbrah Bates for abnormal ct chest. Pt c/o increased DOE x 1 month. He has also had some wt loss. He also c/o some right side CP occ. He states he has been feeling very light headed for the past 2-3 days.   around Feb 2018 noticed sob while turning landing gear on truck and has happened again twice assoc with presyncope one day prior to OV  During a coughing fit   Has noted wheezing lying on L x one year no better with symbicort and uses avg 2-3 x per week   Doe = MMRC1 = can walk nl pace, flat grade, can't hurry or go uphills or steps s sob    No obvious day to day or daytime variability or assoc excess/ purulent sputum or mucus plugs or hemoptysis or cp or chest tightness,   or overt sinus or hb symptoms. No unusual exp hx or h/o childhood pna/ asthma or knowledge of premature birth.  Sleeping ok off L side  without nocturnal  or early am exacerbation  of respiratory  c/o's or need for noct saba. Also denies any obvious fluctuation of symptoms with weather or environmental changes or other aggravating or alleviating factors except as outlined above   Current Medications, Allergies, Complete Past Medical History, Past Surgical History, Family History, and  Social History were reviewed in [employer] record.  ROS  The following are not active complaints unless bolded sore throat, dysphagia, dental problems, itching, sneezing,  nasal congestion or excess/ purulent secretions, ear ache,   fever, chills, sweats, unintended wt loss, classically pleuritic or exertional cp,  orthopnea pnd or leg swelling, presyncope, palpitations, abdominal pain, anorexia, nausea, vomiting, diarrhea  or change in bowel or bladder habits, change in stools or urine, dysuria,hematuria,  rash, arthralgias, visual complaints, headache, numbness, weakness or ataxia or problems with walking or coordination,  change in mood/affect or memory.         Review of Systems     Objective:   Physical Exam    amb wm nad  Wt Readings from Last 3 Encounters:  04/03/17 167 lb 6.4 oz (75.9 kg)  04/01/17 167 lb 9.6 oz (76 kg)  03/24/17 171 lb 3.2 oz (77.7 kg)    Vital signs reviewed  - Note on arrival 02 sats  96% on RA     HEENT: nl   oropharynx. Nl external ear canals without cough reflex - moderate bilateral non-specific turbinate edema   Edentulous    NECK :  without JVD/Nodes/TM/ nl carotid upstrokes bilaterally   LUNGS: no acc muscle use,  slt barrel contour with localized exp > insp wheeze LUL    CV:  RRR  no s3 or murmur  or increase in P2, and no edema   ABD:  soft and nontender with nl inspiratory excursion in the supine position. No bruits or organomegaly appreciated, bowel sounds nl  MS:  Nl gait/ ext warm without deformities, calf tenderness, cyanosis or clubbing No obvious joint restrictions   SKIN: warm and dry without lesions    NEURO:  alert, approp, nl sensorium with  no motor or cerebellar deficits apparent.      I personally reviewed images and agree with radiology impression as follows:   Chest CT w/o contrast 03/25/17 Stable peripheral opacity in LEFT upper lobe, potentially related to prior radiation therapy, recommend  continued surveillance.  Stable abnormal soft tissue at the LEFT hilum extending into the central LEFT lower lobe, question related to prior radiation therapy or tumor, recommend continued surveillance.  Single upper normal sized RIGHT paratracheal node.  Decrease in LEFT pleural effusion  Assessment:

## 2017-04-03 NOTE — Patient Instructions (Signed)
Plan A = Automatic = Symbicort 160 Take 2 puffs first thing in am and then another 2 puffs about 12 hours later.    Work on inhaler technique:  relax and gently blow all the way out then take a nice smooth deep breath back in, triggering the inhaler at same time you start breathing in.  Hold for up to 5 seconds if you can. Blow out thru nose. Rinse and gargle with water when done      Plan B = Backup Only use your albuterol as a rescue medication to be used if you can't catch your breath by resting or doing a relaxed purse lip breathing pattern.  - The less you use it, the better it will work when you need it. - Ok to use the inhaler up to 2 puffs  every 4 hours if you must but call for appointment if use goes up over your usual need - Don't leave home without it !!  (think of it like the spare tire for your car)    Please schedule a follow up office visit in 6 weeks, call sooner if needed with pfts on return

## 2017-04-04 NOTE — Assessment & Plan Note (Signed)
See CT chest 06/22/15 LUL nodule increased size from 01/21/13 - spirometry 07/04/2015  FEV1  2.0 (67%) ratio 71  - PET 07/04/2015 >  Suggestive of Stage IV ca with endobronchial tumor and  with L 3 met > rec fob for 07/26/15 apical segment tbbx p inspect L airways for endobronchial process > FOB 07/26/15 friable mass LLL segmental obst > squamous cell ca > referred  to [facility] > curative RT completed 10/25/15   F/u per Dr Earlie Server planned. If it would change the therapy I would be happy to proceed with repeat bronchoscopy especially if the localized wheezing persists.

## 2017-04-04 NOTE — Assessment & Plan Note (Signed)
Stopped smoking 08/2015 Spirometry 04/03/2017  FEV1 1.79 (61%)  Ratio 61 with classic curvature  - 04/03/2017  After extensive coaching HFA effectiveness =   90%   I am definitely concerned about the localized wheezing on exam representing recurrent or progressive endobronchial disease based on his prior finding of endobronchial disease prior to treatment but note that Dr. Julien Nordmann is following this carefully and for now we'll focus on treating the reversible component of his problem with a combination LABA ICS on a trial basis and have patient return for full PFTs.  I had an extended discussion with the patient reviewing all relevant studies completed to date and  lasting 25 minutes of a 40  minute office  visit to re-establish   re  non-specific but potentially very serious refractory respiratory symptoms of uncertain and potentially multiple  etiologies.   Each maintenance medication was reviewed in detail including most importantly the difference between maintenance and prns and under what circumstances the prns are to be triggered using an action plan format that is not reflected in the computer generated alphabetically organized AVS.    Please see AVS for specific instructions unique to this office visit that I personally wrote and verbalized to the the pt in detail and then reviewed with pt  by my nurse highlighting any changes in therapy/plan of care  recommended at today's visit.

## 2017-04-17 ENCOUNTER — Telehealth: Payer: Self-pay | Admitting: Family

## 2017-04-17 MED FILL — OMEPRAZOLE DR 40 MG CAPSULE: 40 | 30 days supply | Qty: 30 | Fill #1

## 2017-04-17 MED FILL — PRAVASTATIN SODIUM 80 MG TA: 80 | 30 days supply | Qty: 30 | Fill #1

## 2017-04-17 NOTE — Telephone Encounter (Signed)
Needs to come in for controlled substance visit prior to refill.

## 2017-04-17 NOTE — Telephone Encounter (Signed)
Pt is requesting refill on Hydrocodone 5-325mg   Last OV: 03/24/2017 Last Fill: 03/26/2017 #20 and 0RF UDS: None  Please advise.

## 2017-04-17 NOTE — Telephone Encounter (Signed)
Relation to MB:BUYZ Call back number: [phone] Pharmacy:  Reason for call:  Patient requesting a refill HYDROcodone-acetaminophen (NORCO/VICODIN) 5-325 MG tablet

## 2017-04-18 NOTE — Telephone Encounter (Signed)
Spouse informed and will have patient call and schedule appointment

## 2017-04-24 ENCOUNTER — Ambulatory Visit: Payer: Medicare Other | Admitting: Radiation Oncology

## 2017-05-13 MED FILL — PRAVASTATIN SODIUM 80 MG TA: 80 | 30 days supply | Qty: 30 | Fill #2

## 2017-05-13 MED FILL — OMEPRAZOLE DR 40 MG CAPSULE: 40 | 30 days supply | Qty: 30 | Fill #2

## 2017-05-19 ENCOUNTER — Ambulatory Visit (INDEPENDENT_AMBULATORY_CARE_PROVIDER_SITE_OTHER): Payer: Medicare Other | Admitting: Internal Medicine

## 2017-05-19 ENCOUNTER — Encounter: Payer: Self-pay | Admitting: Internal Medicine

## 2017-05-19 VITALS — BP 108/62 | HR 52 | Ht 68.0 in | Wt 168.0 lb

## 2017-05-19 DIAGNOSIS — J449 Chronic obstructive pulmonary disease, unspecified: Secondary | ICD-10-CM

## 2017-05-19 DIAGNOSIS — C3402 Malignant neoplasm of left main bronchus: Secondary | ICD-10-CM | POA: Diagnosis not present

## 2017-05-19 LAB — PULMONARY FUNCTION TEST
DL/VA % pred: 71 %
DL/VA: 3.19 ml/min/mmHg/L
DLCO cor % pred: 54 %
DLCO cor: 16.1 ml/min/mmHg
DLCO unc % pred: 54 %
DLCO unc: 16.14 ml/min/mmHg
FEF 25-75 Post: 1.13 L/s
FEF 25-75 Pre: 0.73 L/s
FEF2575-%Change-Post: 54 %
FEF2575-%Pred-Post: 56 %
FEF2575-%Pred-Pre: 36 %
FEV1-%Change-Post: 11 %
FEV1-%Pred-Post: 69 %
FEV1-%Pred-Pre: 62 %
FEV1-Post: 1.93 L
FEV1-Pre: 1.74 L
FEV1FVC-%Change-Post: 0 %
FEV1FVC-%Pred-Pre: 84 %
FEV6-%Change-Post: 11 %
FEV6-%Pred-Post: 85 %
FEV6-%Pred-Pre: 76 %
FEV6-Post: 3.07 L
FEV6-Pre: 2.75 L
FEV6FVC-%Change-Post: 0 %
FEV6FVC-%Pred-Post: 104 %
FEV6FVC-%Pred-Pre: 104 %
FVC-%Change-Post: 11 %
FVC-%Pred-Post: 81 %
FVC-%Pred-Pre: 73 %
FVC-Post: 3.16 L
FVC-Pre: 2.84 L
Post FEV1/FVC ratio: 61 %
Post FEV6/FVC ratio: 97 %
Pre FEV1/FVC ratio: 61 %
Pre FEV6/FVC Ratio: 97 %
RV % pred: 108 %
RV: 2.67 L
TLC % pred: 84 %
TLC: 5.61 L

## 2017-05-19 NOTE — Progress Notes (Signed)
Subjective:     Patient ID: [facility], male   DOB: 1941/09/28,    MRN: [id number]  HPI   5 yowm quit smoking 08/2015 with no sob and no need for any inhalers referred to pulmonary clinic 04/03/2017 by Dr   Earlie Server  DIAGNOSIS: Stage IIB (T3, N0, M0) non-small cell lung cancer, squamous cell carcinoma presented with left lower lobe endobronchial lesion (basilar segmental level)  as well as suspicious groundglass opacity in the left upper lobe diagnosed in September 2016.  PRIOR THERAPY: Curative radiotherapy to the left upper lobe lung mass under the care of Dr. Sondra Come completed 10/25/2015.   04/03/2017 1st [facility] office visit/ Xzavior Reinig   Chief Complaint  Patient presents with  . Pulmonary Consult    Referred by Dr. [name] for abnormal ct chest. Pt c/o increased DOE x 1 month. He has also had some wt loss. He also c/o some right side CP occ. He states he has been feeling very light headed for the past 2-3 days.   around Feb 2018 noticed sob while turning landing gear on truck and has happened again twice assoc with presyncope one day prior to OV  During a coughing fit   Has noted wheezing lying on L x one year no better with symbicort and uses avg 2-3 x per week   Doe = MMRC1 = can walk nl pace, flat grade, can't hurry or go uphills or steps s sob   rec Plan A = Automatic = Symbicort 160 Take 2 puffs first thing in am and then another 2 puffs about 12 hours later.  Work on inhaler technique:   Plan B = Backup Only use your albuterol as a rescue medication     05/19/2017  f/u ov/Kavin Weckwerth re:  COPD GOLD II no smoking in 2 month but vapes symb 160 2bid  Chief Complaint  Patient presents with  . Follow-up    PFT's done today. He states he is wheezing less. He is using his albuterol inhaler 3-4 x per wk on average.   noct wheeze worse when lies on L side, never tries saba noct and doesn't wake him up at all, just "noisy"   Still sob cranking landing gear on truck o/w  doe = MMRC1 = can walk nl pace, flat grade, can't hurry or go uphills or steps s sob      No obvious day to day or daytime variability or assoc excess/ purulent sputum or mucus plugs or hemoptysis or cp or chest tightness, subjective wheeze or overt sinus or hb symptoms. No unusual exp hx or h/o childhood pna/ asthma or knowledge of premature birth.  Sleeping ok without nocturnal  or early am exacerbation  of respiratory  c/o's or need for noct saba. Also denies any obvious fluctuation of symptoms with weather or environmental changes or other aggravating or alleviating factors except as outlined above   Current Medications, Allergies, Complete Past Medical History, Past Surgical History, Family History, and Social History were reviewed in [employer] record.  ROS  The following are not active complaints unless bolded sore throat, dysphagia, dental problems, itching, sneezing,  nasal congestion or excess/ purulent secretions, ear ache,   fever, chills, sweats, unintended wt loss, classically pleuritic or exertional cp,  orthopnea pnd or leg swelling, presyncope, palpitations, abdominal pain, anorexia, nausea, vomiting, diarrhea  or change in bowel or bladder habits, change in stools or urine, dysuria,hematuria,  rash, arthralgias, visual complaints, headache, numbness, weakness or ataxia  or problems with walking or coordination,  change in mood/affect or memory.                         Objective:   Physical Exam    amb wm nad   05/19/2017       168   04/03/17 167 lb 6.4 oz (75.9 kg)  04/01/17 167 lb 9.6 oz (76 kg)  03/24/17 171 lb 3.2 oz (77.7 kg)    Vital signs reviewed  - Note on arrival 02 sats  98% on RA     HEENT: nl   oropharynx. Nl external ear canals without cough reflex - moderate bilateral non-specific turbinate edema   Edentulous    NECK :  without JVD/Nodes/TM/ nl carotid upstrokes bilaterally   LUNGS: no acc muscle use,  slt barrel contour  with scattered insp and exp rhonchi worse on L    CV:  RRR  no s3 or murmur or increase in P2, and no edema   ABD:  soft and nontender with nl inspiratory excursion in the supine position. No bruits or organomegaly appreciated, bowel sounds nl  MS:  Nl gait/ ext warm without deformities, calf tenderness, cyanosis or clubbing No obvious joint restrictions   SKIN: warm and dry without lesions    NEURO:  alert, approp, nl sensorium with  no motor or cerebellar deficits apparent.      I personally reviewed images and agree with radiology impression as follows:   Chest CT w/o contrast 03/25/17 Stable peripheral opacity in LEFT upper lobe, potentially related to prior radiation therapy, recommend continued surveillance.  Stable abnormal soft tissue at the LEFT hilum extending into the central LEFT lower lobe, question related to prior radiation therapy or tumor, recommend continued surveillance.  Single upper normal sized RIGHT paratracheal node.  Decrease in LEFT pleural effusion  Assessment:

## 2017-05-19 NOTE — Patient Instructions (Addendum)
No change in medications  Avoid cigarettes if at all possible - this is critically important   Please schedule a follow up visit in 3 months but call sooner if needed

## 2017-05-19 NOTE — Progress Notes (Signed)
PFT done today. 

## 2017-05-21 NOTE — Assessment & Plan Note (Signed)
Stopped smoking 08/2015 Spirometry 04/03/2017  FEV1 1.79 (61%)  Ratio 61 with classic curvature  - 04/03/2017  After extensive coaching HFA effectiveness =   90%  -  PFT's  05/19/2017  FEV1 1.93 (69 % ) ratio 61  p 11 % improvement from saba p nothing  prior to study with DLCO  54 % corrects to 71  % for alv volume    He has moderate dz and well compensated on symb 160 2bid  - the "wheeze" he hears at night doesn't actually cause symptoms and is likely residual for RT to RUL   No change in rx needed   I had an extended discussion with the patient reviewing all relevant studies completed to date and  lasting 15 to 20 minutes of a 25 minute visit    Each maintenance medication was reviewed in detail including most importantly the difference between maintenance and prns and under what circumstances the prns are to be triggered using an action plan format that is not reflected in the computer generated alphabetically organized AVS.    Please see AVS for specific instructions unique to this visit that I personally wrote and verbalized to the the pt in detail and then reviewed with pt  by my nurse highlighting any  changes in therapy recommended at today's visit to their plan of care.

## 2017-05-21 NOTE — Assessment & Plan Note (Signed)
See CT chest 06/22/15 LUL nodule increased size from 01/21/13 - spirometry 07/04/2015  FEV1  2.0 (67%) ratio 71  - PET 07/04/2015 >  Suggestive of Stage IV ca with endobronchial tumor and  with L 3 met > rec fob for 07/26/15 apical segment tbbx p inspect L airways for endobronchial process > FOB 07/26/15 friable mass LLL segmental obst > squamous cell ca > referred  to [facility] > curative RT completed 10/25/15   F/u per oncology

## 2017-06-15 ENCOUNTER — Other Ambulatory Visit: Payer: Self-pay | Admitting: Family

## 2017-06-16 MED FILL — OMEPRAZOLE DR 40 MG CAPSULE: 40 | 30 days supply | Qty: 30 | Fill #3

## 2017-06-16 MED FILL — PRAVASTATIN SODIUM 80 MG TA: 80 | 30 days supply | Qty: 30 | Fill #3

## 2017-06-17 ENCOUNTER — Other Ambulatory Visit: Payer: Self-pay | Admitting: *Deleted

## 2017-06-17 ENCOUNTER — Ambulatory Visit (HOSPITAL_BASED_OUTPATIENT_CLINIC_OR_DEPARTMENT_OTHER): Payer: Medicare Other

## 2017-06-17 DIAGNOSIS — C3412 Malignant neoplasm of upper lobe, left bronchus or lung: Secondary | ICD-10-CM

## 2017-06-17 DIAGNOSIS — C3432 Malignant neoplasm of lower lobe, left bronchus or lung: Secondary | ICD-10-CM

## 2017-06-17 LAB — COMPREHENSIVE METABOLIC PANEL
ALT: 10 U/L (ref 0–55)
AST: 16 U/L (ref 5–34)
Albumin: 3.3 g/dL — ABNORMAL LOW (ref 3.5–5.0)
Alkaline Phosphatase: 82 U/L (ref 40–150)
Anion Gap: 9 mEq/L (ref 3–11)
BUN: 18.1 mg/dL (ref 7.0–26.0)
CO2: 28 mEq/L (ref 22–29)
Calcium: 9.8 mg/dL (ref 8.4–10.4)
Chloride: 103 mEq/L (ref 98–109)
Creatinine: 0.8 mg/dL (ref 0.7–1.3)
EGFR: 86 mL/min/{1.73_m2} — ABNORMAL LOW (ref >90)
Glucose: 95 mg/dl (ref 70–140)
Potassium: 4.5 mEq/L (ref 3.5–5.1)
Sodium: 140 mEq/L (ref 136–145)
Total Bilirubin: 0.47 mg/dL (ref 0.20–1.20)
Total Protein: 6.4 g/dL (ref 6.4–8.3)

## 2017-06-17 LAB — CBC WITH DIFFERENTIAL (CANCER CENTER ONLY)
BASO#: 0 10*3/uL (ref 0.0–0.2)
BASO%: 0.2 % (ref 0.0–2.0)
EOS%: 2.1 % (ref 0.0–7.0)
Eosinophils Absolute: 0.2 10*3/uL (ref 0.0–0.5)
HCT: 43.8 % (ref 38.7–49.9)
HGB: 14.7 g/dL (ref 13.0–17.1)
LYMPH#: 1.2 10*3/uL (ref 0.9–3.3)
LYMPH%: 13.7 % — ABNORMAL LOW (ref 14.0–48.0)
MCH: 31.3 pg (ref 28.0–33.4)
MCHC: 33.6 g/dL (ref 32.0–35.9)
MCV: 93 fL (ref 82–98)
MONO#: 0.9 10*3/uL (ref 0.1–0.9)
MONO%: 9.9 % (ref 0.0–13.0)
NEUT#: 6.5 10*3/uL (ref 1.5–6.5)
NEUT%: 74.1 % (ref 40.0–80.0)
Platelets: 230 10*3/uL (ref 145–400)
RBC: 4.69 10*6/uL (ref 4.20–5.70)
RDW: 13.5 % (ref 11.1–15.7)
WBC: 8.7 10*3/uL (ref 4.0–10.0)

## 2017-06-17 MED ORDER — MELOXICAM 7.5 MG PO TABS: ORAL_TABLET | ORAL | 0 refills | Status: DC

## 2017-06-17 MED ORDER — ASPIRIN EC 81 MG PO TBEC: 81.0000 mg | DELAYED_RELEASE_TABLET | Freq: Every day | ORAL | 1 refills | Status: DC

## 2017-06-17 MED ORDER — PREDNISONE 50 MG PO TABS: ORAL_TABLET | ORAL | 0 refills | Status: DC

## 2017-06-17 MED ORDER — DIPHENHYDRAMINE HCL 50 MG PO TABS: ORAL_TABLET | ORAL | 0 refills | Status: DC

## 2017-06-17 MED FILL — ASPIRIN ADULT LOW STRENGTH: 81 | 90 days supply | Qty: 90 | Fill #0

## 2017-06-17 MED FILL — MELOXICAM 7.5 MG TABLET: 7.5 | 90 days supply | Qty: 90 | Fill #0

## 2017-06-17 MED FILL — BANOPHEN 25 MG CAPSULE: 25 | 50 days supply | Qty: 100 | Fill #0

## 2017-06-17 MED FILL — predniSONE 50 MG TABS: 50 | 1 days supply | Qty: 3 | Fill #0

## 2017-06-17 NOTE — Telephone Encounter (Signed)
Refill sent.

## 2017-06-17 NOTE — Telephone Encounter (Signed)
Ok to refill meloxicam please.

## 2017-06-17 NOTE — Telephone Encounter (Signed)
Rx for premeds for scan sent to pt pharmacy

## 2017-06-18 ENCOUNTER — Other Ambulatory Visit: Payer: Self-pay | Admitting: Medical Oncology

## 2017-06-21 ENCOUNTER — Other Ambulatory Visit (HOSPITAL_BASED_OUTPATIENT_CLINIC_OR_DEPARTMENT_OTHER): Payer: Medicare Other

## 2017-06-22 ENCOUNTER — Encounter (HOSPITAL_BASED_OUTPATIENT_CLINIC_OR_DEPARTMENT_OTHER): Payer: Self-pay

## 2017-06-22 ENCOUNTER — Ambulatory Visit (HOSPITAL_BASED_OUTPATIENT_CLINIC_OR_DEPARTMENT_OTHER)
Admission: RE | Admit: 2017-06-22 | Discharge: 2017-06-22 | Disposition: A | Discharge status: Home / Self Care | Payer: Medicare Other | Source: Ambulatory Visit | Attending: Internal Medicine | Admitting: Internal Medicine

## 2017-06-22 DIAGNOSIS — D3502 Benign neoplasm of left adrenal gland: Secondary | ICD-10-CM | POA: Insufficient documentation

## 2017-06-22 DIAGNOSIS — I2510 Atherosclerotic heart disease of native coronary artery without angina pectoris: Secondary | ICD-10-CM | POA: Diagnosis not present

## 2017-06-22 DIAGNOSIS — J439 Emphysema, unspecified: Secondary | ICD-10-CM | POA: Insufficient documentation

## 2017-06-22 DIAGNOSIS — I700 Atherosclerosis of aorta: Secondary | ICD-10-CM | POA: Diagnosis not present

## 2017-06-22 DIAGNOSIS — C3432 Malignant neoplasm of lower lobe, left bronchus or lung: Secondary | ICD-10-CM

## 2017-06-22 DIAGNOSIS — J90 Pleural effusion, not elsewhere classified: Secondary | ICD-10-CM | POA: Diagnosis not present

## 2017-06-22 MED ORDER — IOPAMIDOL (ISOVUE-300) INJECTION 61%
80.0000 mL | Freq: Once | INTRAVENOUS | Status: AC | PRN
  Administered 2017-06-22: 80 mL via INTRAVENOUS

## 2017-06-23 MED FILL — SYMBICORT 160-4.5 MCG INH: 160-4.5 | 30 days supply | Qty: 10 | Fill #1

## 2017-06-23 MED FILL — PROAIR HFA 90 MCG INHALER: 108 (90 BAS | 25 days supply | Qty: 9 | Fill #1

## 2017-07-02 ENCOUNTER — Encounter: Payer: Self-pay | Admitting: Internal Medicine

## 2017-07-02 ENCOUNTER — Telehealth: Payer: Self-pay | Admitting: Internal Medicine

## 2017-07-02 ENCOUNTER — Ambulatory Visit (HOSPITAL_BASED_OUTPATIENT_CLINIC_OR_DEPARTMENT_OTHER): Payer: Medicare Other | Admitting: Internal Medicine

## 2017-07-02 ENCOUNTER — Other Ambulatory Visit: Payer: Medicare Other

## 2017-07-02 VITALS — BP 126/73 | HR 64 | Temp 97.7°F | Resp 18 | Wt 169.0 lb

## 2017-07-02 DIAGNOSIS — J449 Chronic obstructive pulmonary disease, unspecified: Secondary | ICD-10-CM

## 2017-07-02 DIAGNOSIS — Z85118 Personal history of other malignant neoplasm of bronchus and lung: Secondary | ICD-10-CM | POA: Diagnosis not present

## 2017-07-02 DIAGNOSIS — C3432 Malignant neoplasm of lower lobe, left bronchus or lung: Secondary | ICD-10-CM

## 2017-07-02 NOTE — Progress Notes (Signed)
[name]   [phone]  OFFICE PROGRESS NOTE  Robert Alar, NP [name] [id number]  DIAGNOSIS: Stage IIB (T3, N0, M0) non-small cell lung cancer, squamous cell carcinoma presented with left lower lobe endobronchial lesion as well as suspicious groundglass opacity in the left upper lobe diagnosed in September 2016.  PRIOR THERAPY: Curative radiotherapy to the left upper lobe lung mass under the care of Dr. Sondra Come completed 10/25/2015.  CURRENT THERAPY: Observation  INTERVAL HISTORY: Robert Bates 76 y.o. male returns to the clinic today for follow-up visit accompanied by his wife. The patient is feeling fine today with no specific complaints except for shortness breath with exertion. He denied having any chest pain has mild cough with no hemoptysis. He denied having any significant weight loss or night sweats. He has no nausea, vomiting, diarrhea or constipation. He is here today for evaluation after repeating CT scan of the chest.  MEDICAL HISTORY: Past Medical History:  Diagnosis Date  . Alcoholism (Robert Bates)    past problem per pt but admits to still drinking - unclear how much  . Anxiety   . Arthritis   . 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  [name] GERD (gastroesophageal reflux disease)   . Hematuria    refuses work up or referral - understands risks of morbidity / mortality - 11/2008, 12/2008  . History of hiatal hernia   . Hyperlipemia   . Kidney cyst, acquired   . Kidney stones   . Meningioma (Robert Bates) 10/25/2013   Follows with Dr. [name].   . Peripheral vascular disease ([name])    Abdominal Aortic Aneursym  . Pneumonia    as a child  . Radiation 09/18/15-10/25/15   left lower lobe 70.2 Gy  . Tobacco abuse     ALLERGIES:  is allergic to iodine.  MEDICATIONS:  Current Outpatient Prescriptions    Medication Sig Dispense Refill  . albuterol (PROVENTIL HFA;VENTOLIN HFA) 108 (90 Base) MCG/ACT inhaler Inhale 2 puffs into the lungs every 6 (six) hours as needed for wheezing or shortness of breath. 1 Inhaler 1  . aspirin EC 81 MG tablet Take 1 tablet (81 mg total) by mouth daily. 90 tablet 1  . budesonide-formoterol (SYMBICORT) 160-4.5 MCG/ACT inhaler Inhale 2 puffs into the lungs 2 (two) times daily. 1 Inhaler 3  . meloxicam (MOBIC) 7.5 MG tablet TAKE 1 TABLET (7.5 MG TOTAL) BY MOUTH DAILY. 90 tablet 0  . omeprazole (PRILOSEC) 40 MG capsule Take 1 capsule (40 mg total) by mouth daily. PATIENT NEEDS OFFICE VISIT FOR FURTHER REFILLS 30 capsule 5  . pravastatin (PRAVACHOL) 80 MG tablet Take 1 tablet (80 mg total) by mouth daily. 30 tablet 5  . diphenhydrAMINE (BENADRYL) 50 MG tablet Take 1 tablet ( 50mg ) 1 hour before Scan (Patient not taking: Reported on 07/02/2017) 2 tablet 0  . predniSONE (DELTASONE) 50 MG tablet Take 1 tablet 13 hours , 7 hours and 1 hour before CT (Patient not taking: Reported on 07/02/2017) 3 tablet 0   No current facility-administered medications for this visit.     SURGICAL HISTORY:  Past Surgical History:  Procedure Laterality Date  . COLONOSCOPY    . EYE SURGERY Bilateral    Cataracts removed w/ lens implant  . HERNIA REPAIR    . TONSILLECTOMY    . VIDEO BRONCHOSCOPY  Bilateral 07/26/2015   Procedure: VIDEO BRONCHOSCOPY WITH FLUORO;  Surgeon: [name], MD;  Location: [name];  Service: Cardiopulmonary;  Laterality: Bilateral;  . VIDEO BRONCHOSCOPY WITH ENDOBRONCHIAL NAVIGATION N/A 08/23/2015   Procedure: VIDEO BRONCHOSCOPY WITH ENDOBRONCHIAL NAVIGATION;  Surgeon: Grace Isaac, MD;  Location: [name];  Service: Thoracic;  Laterality: N/A;  . VIDEO BRONCHOSCOPY WITH ENDOBRONCHIAL ULTRASOUND N/A 08/23/2015   Procedure: VIDEO BRONCHOSCOPY WITH ENDOBRONCHIAL ULTRASOUND;  Surgeon: Grace Isaac, MD;  Location: [name];  Service: Thoracic;  Laterality: N/A;     REVIEW OF SYSTEMS:  A comprehensive review of systems was negative except for: Respiratory: positive for cough and dyspnea on exertion   PHYSICAL EXAMINATION: General appearance: alert, cooperative and no distress Head: Normocephalic, without obvious abnormality, atraumatic Neck: no adenopathy, no JVD, supple, symmetrical, trachea midline and thyroid not enlarged, symmetric, no tenderness/mass/nodules Lymph nodes: Cervical, supraclavicular, and axillary nodes normal. Resp: clear to auscultation bilaterally Back: symmetric, no curvature. ROM normal. No CVA tenderness. Cardio: regular rate and rhythm, S1, S2 normal, no murmur, click, rub or gallop GI: soft, non-tender; bowel sounds normal; no masses,  no organomegaly Extremities: extremities normal, atraumatic, no cyanosis or edema  ECOG PERFORMANCE STATUS: 1 - Symptomatic but completely ambulatory  Blood pressure 126/73, pulse 64, temperature 97.7 F (36.5 C), temperature source Oral, resp. rate 18, weight 169 lb (76.7 kg), SpO2 100 %.  LABORATORY DATA: Lab Results  Component Value Date   WBC 8.7 06/17/2017   HGB 14.7 06/17/2017   HCT 43.8 06/17/2017   MCV 93 06/17/2017   PLT 230 06/17/2017      Chemistry      Component Value Date/Time   NA 140 06/17/2017 1315   K 4.5 06/17/2017 1315   CL 101 03/26/2017 1501   CO2 28 06/17/2017 1315   BUN 18.1 06/17/2017 1315   CREATININE 0.8 06/17/2017 1315      Component Value Date/Time   CALCIUM 9.8 06/17/2017 1315   ALKPHOS 82 06/17/2017 1315   AST 16 06/17/2017 1315   ALT 10 06/17/2017 1315   BILITOT 0.47 06/17/2017 1315       RADIOGRAPHIC STUDIES: Ct Chest W Contrast  Result Date: 06/22/2017 CLINICAL DATA:  Stage IIB squamous cell left lower lobe lung carcinoma and lung adenocarcinoma of the left upper lobe diagnosed 2016, status post curative radiation therapy. Patient presents for restaging on observation. EXAM: CT CHEST WITH CONTRAST TECHNIQUE: Multidetector CT imaging  of the chest was performed during intravenous contrast administration. CONTRAST:  2mL ISOVUE-300 IOPAMIDOL (ISOVUE-300) INJECTION 61% COMPARISON:  03/25/2017 chest CT. FINDINGS: Cardiovascular: Normal heart size. Stable small pericardial effusion/ thickening anteriorly. Left main and left anterior descending coronary atherosclerosis. Atherosclerotic nonaneurysmal thoracic aorta. Normal caliber pulmonary arteries . No central pulmonary emboli. Mediastinum/Nodes: Stable subcentimeter hypodense right thyroid lobe nodule. Unremarkable esophagus. No pathologically enlarged axillary, mediastinal or hilar lymph nodes. Lungs/Pleura: No pneumothorax. No right pleural effusion. Trace dependent left pleural effusion, stable. Mild centrilobular and paraseptal emphysema. There is continued evolution of sharply marginated parahilar consolidation in the left upper and lower lobes with associated mildly progressive significant volume loss and distortion and mild bronchiectasis, compatible with evolving radiation fibrosis. Peripheral right upper lobe 2 mm solid pulmonary nodule (series 3/ image 39) is stable back to at least 07/18/2015 and considered benign. No acute consolidative airspace disease, lung masses or new significant pulmonary nodules. Upper abdomen: Small scattered liver cysts, largest 1.3 cm. Stable 1.3 cm left adrenal adenoma back to 07/18/2015. Stable scarring in  the lateral upper right kidney. Musculoskeletal: No aggressive appearing focal osseous lesions. Mild thoracic spondylosis. IMPRESSION: 1. 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 2. No evidence of metastatic disease in the chest . 3. Stable trace dependent left pleural effusion. 4. Left main and 1 vessel coronary atherosclerosis. 5. Stable left adrenal adenoma. Aortic Atherosclerosis (ICD10-I70.0) and Emphysema (ICD10-J43.9). Electronically Signed   By: Ilona Sorrel M.D.   On: 06/22/2017  14:05    ASSESSMENT AND PLAN:  This is a very pleasant 76 years old white male with a stage IIB non-small cell lung cancer, squamous cell carcinoma presented with left upper lobe endobronchial lesion in addition to left upper lobe suspicious groundglass opacity. He is status post curative radiotherapy under the care of Dr. Sondra Come. The patient is currently on observation and he is feeling well today. His recent CT scan of the chest showed no evidence for disease recurrence or metastasis. I discussed the scan results with the patient and his wife. I recommended for him to continue on observation with repeat CT scan of the chest in 6 months. He was advised to call immediately if he has any concerning symptoms in the interval. The patient voices understanding of current disease status and treatment options and is in agreement with the current care plan. All questions were answered. The patient knows to call the clinic with any problems, questions or concerns. We can certainly see the patient much sooner if necessary. I spent 10 minutes counseling the patient face to face. The total time spent in the appointment was 15 minutes.  Disclaimer: This note was dictated with voice recognition software. Similar sounding words can inadvertently be transcribed and may not be corrected upon review.

## 2017-07-02 NOTE — Telephone Encounter (Signed)
Gave patient avs and calendar for feburary and march appointments

## 2017-07-10 ENCOUNTER — Telehealth: Payer: Self-pay | Admitting: Family

## 2017-07-10 NOTE — Telephone Encounter (Signed)
[facility] Day - Client TELEPHONE ADVICE RECORD [facility] Patient Name: Robert Bates DOB: 1941/05/23 Initial Comment Caller states is having pain where his gall bladder used to be. Pain pills are not working. Nurse Assessment Nurse: Cox, RN, [place] Date/Time (Eastern Time): 07/10/2017 3:19:35 PM Confirm and document reason for call. If symptomatic, describe symptoms. ---Caller states he has pain in right side below ribs Does the patient have any new or worsening symptoms? ---Yes Will a triage be completed? ---Yes Related visit to physician within the last 2 weeks? ---Yes Does the PT have any chronic conditions? (i.e. diabetes, asthma, etc.) ---Yes List chronic conditions. ---lung CA Is this a behavioral health or substance abuse call? ---No Guidelines Guideline Title Affirmed Question Affirmed Notes Flank Pain [1] SEVERE pain (e.g., excruciating, scale 8-10) AND [2] present > 1 hour Final Disposition User Go to ED Now Cox, RN, [place] Comments Spoke to [place] at office who said she would send message to nurse [facility] regarding patient symptoms, outcome, and ER refusal Referrals GO TO FACILITY REFUSED Disagree/Comply: Disagree Disagree/Comply Reason: Disagree with instructions Call Id: [id number]

## 2017-07-10 NOTE — Telephone Encounter (Signed)
Ebony Hail from [facility] called in to make provider aware that pt was triaged and refused to go to the ER.

## 2017-07-11 ENCOUNTER — Ambulatory Visit (INDEPENDENT_AMBULATORY_CARE_PROVIDER_SITE_OTHER): Payer: Medicare Other | Admitting: Family

## 2017-07-11 ENCOUNTER — Ambulatory Visit (HOSPITAL_BASED_OUTPATIENT_CLINIC_OR_DEPARTMENT_OTHER)
Admission: RE | Admit: 2017-07-11 | Discharge: 2017-07-11 | Disposition: A | Discharge status: Home / Self Care | Payer: Medicare Other | Source: Ambulatory Visit | Attending: Family | Admitting: Family

## 2017-07-11 ENCOUNTER — Encounter: Payer: Self-pay | Admitting: Family

## 2017-07-11 DIAGNOSIS — I700 Atherosclerosis of aorta: Secondary | ICD-10-CM | POA: Diagnosis not present

## 2017-07-11 DIAGNOSIS — R109 Unspecified abdominal pain: Secondary | ICD-10-CM

## 2017-07-11 DIAGNOSIS — K7689 Other specified diseases of liver: Secondary | ICD-10-CM | POA: Diagnosis not present

## 2017-07-11 DIAGNOSIS — K5730 Diverticulosis of large intestine without perforation or abscess without bleeding: Secondary | ICD-10-CM | POA: Insufficient documentation

## 2017-07-11 DIAGNOSIS — K449 Diaphragmatic hernia without obstruction or gangrene: Secondary | ICD-10-CM | POA: Insufficient documentation

## 2017-07-11 DIAGNOSIS — K8020 Calculus of gallbladder without cholecystitis without obstruction: Secondary | ICD-10-CM | POA: Insufficient documentation

## 2017-07-11 DIAGNOSIS — N210 Calculus in bladder: Secondary | ICD-10-CM | POA: Diagnosis not present

## 2017-07-11 DIAGNOSIS — K4090 Unilateral inguinal hernia, without obstruction or gangrene, not specified as recurrent: Secondary | ICD-10-CM | POA: Insufficient documentation

## 2017-07-11 DIAGNOSIS — N200 Calculus of kidney: Secondary | ICD-10-CM | POA: Diagnosis not present

## 2017-07-11 DIAGNOSIS — N400 Enlarged prostate without lower urinary tract symptoms: Secondary | ICD-10-CM | POA: Diagnosis not present

## 2017-07-11 DIAGNOSIS — N281 Cyst of kidney, acquired: Secondary | ICD-10-CM | POA: Diagnosis not present

## 2017-07-11 LAB — CBC WITH DIFFERENTIAL/PLATELET
Basophils Absolute: 0 10*3/uL (ref 0.0–0.1)
Basophils Relative: 0.3 % (ref 0.0–3.0)
Eosinophils Absolute: 0.2 10*3/uL (ref 0.0–0.7)
Eosinophils Relative: 1.8 % (ref 0.0–5.0)
HCT: 45.8 % (ref 39.0–52.0)
Hemoglobin: 15 g/dL (ref 13.0–17.0)
Lymphocytes Relative: 12.9 % (ref 12.0–46.0)
Lymphs Abs: 1.2 10*3/uL (ref 0.7–4.0)
MCHC: 32.7 g/dL (ref 30.0–36.0)
MCV: 94.4 fl (ref 78.0–100.0)
Monocytes Absolute: 1.2 10*3/uL — ABNORMAL HIGH (ref 0.1–1.0)
Monocytes Relative: 12.5 % — ABNORMAL HIGH (ref 3.0–12.0)
Neutro Abs: 6.9 10*3/uL (ref 1.4–7.7)
Neutrophils Relative %: 72.5 % (ref 43.0–77.0)
Platelets: 243 10*3/uL (ref 150.0–400.0)
RBC: 4.85 Mil/uL (ref 4.22–5.81)
RDW: 14.2 % (ref 11.5–15.5)
WBC: 9.4 10*3/uL (ref 4.0–10.5)

## 2017-07-11 LAB — COMPREHENSIVE METABOLIC PANEL
ALT: 10 U/L (ref 0–53)
AST: 11 U/L (ref 0–37)
Albumin: 3.8 g/dL (ref 3.5–5.2)
Alkaline Phosphatase: 73 U/L (ref 39–117)
BUN: 19 mg/dL (ref 6–23)
CO2: 31 mEq/L (ref 19–32)
Calcium: 9.6 mg/dL (ref 8.4–10.5)
Chloride: 104 mEq/L (ref 96–112)
Creatinine, Ser: 0.89 mg/dL (ref 0.40–1.50)
GFR: 88.32 mL/min (ref >60.00)
Glucose, Bld: 101 mg/dL — ABNORMAL HIGH (ref 70–99)
Potassium: 5.1 mEq/L (ref 3.5–5.1)
Sodium: 143 mEq/L (ref 135–145)
Total Bilirubin: 0.5 mg/dL (ref 0.2–1.2)
Total Protein: 6.4 g/dL (ref 6.0–8.3)

## 2017-07-11 LAB — LIPASE: Lipase: 13 U/L (ref 11.0–59.0)

## 2017-07-11 MED ORDER — PREDNISONE 50 MG PO TABS: ORAL_TABLET | ORAL | 0 refills | Status: DC

## 2017-07-11 MED ORDER — TRAMADOL HCL 50 MG PO TABS
50.0000 mg | ORAL_TABLET | Freq: Four times a day (QID) | ORAL | 0 refills | Status: DC | PRN
Start: 2017-07-11 — End: 2017-08-15

## 2017-07-11 MED ORDER — DIPHENHYDRAMINE HCL 50 MG PO TABS: ORAL_TABLET | ORAL | 0 refills | Status: DC

## 2017-07-11 MED ORDER — ONDANSETRON HCL 4 MG PO TABS: 4.0000 mg | ORAL_TABLET | Freq: Three times a day (TID) | ORAL | 0 refills | Status: DC | PRN

## 2017-07-11 MED ORDER — IOPAMIDOL (ISOVUE-300) INJECTION 61%
100.0000 mL | Freq: Once | INTRAVENOUS | Status: AC | PRN
  Administered 2017-07-11: 100 mL via INTRAVENOUS

## 2017-07-11 MED FILL — traMADol HCL 50 MG TABS: 50 | 5 days supply | Qty: 20 | Fill #0

## 2017-07-11 MED FILL — predniSONE 50 MG TABS: 50 | 1 days supply | Qty: 3 | Fill #0

## 2017-07-11 MED FILL — ONDANSETRON HCL 4 MG TABLET: 4 | 7 days supply | Qty: 20 | Fill #0

## 2017-07-11 NOTE — Patient Instructions (Signed)
Please complete lab work prior to leaving. He may use Zofran as needed for nausea. He may use tramadol as needed for severe pain. Please go to the first port to complete your CAT scan. Please go to the ER if severe/worsening pain. We will contact you with the results.

## 2017-07-11 NOTE — Telephone Encounter (Signed)
Noted  

## 2017-07-11 NOTE — Progress Notes (Signed)
Subjective:    Patient ID: Robert Bates, male    DOB: 1940-12-29, 76 y.o.   MRN: [id number]  HPI  Robert Bates is a 76 yr old male who presents today with chief complaint of RUQ abdominal pain. Reports that he initially had symptoms 1 week ago.  Symptoms resolved for 1 week. Reports that yesterday at 5 AM symptoms returned.  Has associated nausea without vomiting.  Denies fever. He tried an otc pain reliever, unsure what it was.  Reports no improvement with this.  Reports pain is constant, "like a toothache."    Review of Systems See HPI  Past Medical History:  Diagnosis Date  . Alcoholism ([place])    past problem per pt but admits to still drinking - unclear how much  . Anxiety   . Arthritis   . Cancer ([place]) 2016   lung- 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  [name] GERD (gastroesophageal reflux disease)   . Hematuria    refuses work up or referral - understands risks of morbidity / mortality - 11/2008, 12/2008  . History of hiatal hernia   . Hyperlipemia   . Kidney cyst, acquired   . Kidney stones   . Meningioma ([place]) 10/25/2013   Follows with Dr. [name].   . Peripheral vascular disease ([place])    Abdominal Aortic Aneursym  . Pneumonia    as a child  . Radiation 09/18/15-10/25/15   left lower lobe 70.2 Gy  . Tobacco abuse      Social History   Social History  . Marital status: Married    Spouse name: N/A  . Number of children: 2  . Years of education: N/A   Occupational History  . Retired Scientist, research (medical)  .  [employer]   Social History Main Topics  . Smoking status: Former Smoker    Packs/day: 1.00    Years: 57.00    Quit date: 08/08/2015  . Smokeless tobacco: Former Systems developer    Types: Chew    Quit date: 11/04/1958  . Alcohol use 0.0 oz/week     Comment: Pt reports rare use  . Drug use: No  . Sexual activity: Not on file   Other Topics Concern  . Not on file   Social History Narrative    . No narrative on file    Past Surgical History:  Procedure Laterality Date  . COLONOSCOPY    . EYE SURGERY Bilateral    Cataracts removed w/ lens implant  . HERNIA REPAIR    . TONSILLECTOMY    . VIDEO BRONCHOSCOPY Bilateral 07/26/2015   Procedure: VIDEO BRONCHOSCOPY WITH FLUORO;  Surgeon: [name], MD;  Location: [facility];  Service: Cardiopulmonary;  Laterality: Bilateral;  . VIDEO BRONCHOSCOPY WITH ENDOBRONCHIAL NAVIGATION N/A 08/23/2015   Procedure: VIDEO BRONCHOSCOPY WITH ENDOBRONCHIAL NAVIGATION;  Surgeon: Grace Isaac, MD;  Location: [facility];  Service: Thoracic;  Laterality: N/A;  . VIDEO BRONCHOSCOPY WITH ENDOBRONCHIAL ULTRASOUND N/A 08/23/2015   Procedure: VIDEO BRONCHOSCOPY WITH ENDOBRONCHIAL ULTRASOUND;  Surgeon: Grace Isaac, MD;  Location: [facility];  Service: Thoracic;  Laterality: N/A;    Family History  Problem Relation Age of Onset  . Leukemia Father   . Emphysema Father   . Learning disabilities Son   . Leukemia Other   . Stroke Other     Allergies  Allergen Reactions  . Iodine     REACTION: neck swells  Current Outpatient Prescriptions on File Prior to Visit  Medication Sig Dispense Refill  . albuterol (PROVENTIL HFA;VENTOLIN HFA) 108 (90 Base) MCG/ACT inhaler Inhale 2 puffs into the lungs every 6 (six) hours as needed for wheezing or shortness of breath. 1 Inhaler 1  . aspirin EC 81 MG tablet Take 1 tablet (81 mg total) by mouth daily. 90 tablet 1  . budesonide-formoterol (SYMBICORT) 160-4.5 MCG/ACT inhaler Inhale 2 puffs into the lungs 2 (two) times daily. 1 Inhaler 3  . diphenhydrAMINE (BENADRYL) 50 MG tablet Take 1 tablet ( 50mg ) 1 hour before Scan 2 tablet 0  . meloxicam (MOBIC) 7.5 MG tablet TAKE 1 TABLET (7.5 MG TOTAL) BY MOUTH DAILY. 90 tablet 0  . omeprazole (PRILOSEC) 40 MG capsule Take 1 capsule (40 mg total) by mouth daily. PATIENT NEEDS OFFICE VISIT FOR FURTHER REFILLS 30 capsule 5  . pravastatin (PRAVACHOL) 80 MG tablet Take 1  tablet (80 mg total) by mouth daily. 30 tablet 5   No current facility-administered medications on file prior to visit.     BP 131/81 (BP Location: Right Arm, Cuff Size: Normal)   Pulse 72   Temp 98 F (36.7 C) (Oral)   Resp 18   Ht 5\' 8"  (1.727 m)   Wt 169 lb 12.8 oz (77 kg)   SpO2 97%   BMI 25.82 kg/m       Objective:   Physical Exam  Constitutional: He is oriented to person, place, and time. He appears well-developed and well-nourished. No distress.  HENT:  Head: Normocephalic and atraumatic.  Cardiovascular: Normal rate and regular rhythm.   No murmur heard. Pulmonary/Chest: Effort normal and breath sounds normal. No respiratory distress. He has no wheezes. He has no rales.  Abdominal: Soft.  + RUQ tenderness to palpation. Multiple superficial abdominal masses that are non-tender, mobile   Musculoskeletal: He exhibits no edema.  Neurological: He is alert and oriented to person, place, and time.  Skin: Skin is warm and dry.  Psychiatric: He has a normal mood and affect. His behavior is normal. Thought content normal.          Assessment & Plan:  RUQ pain- ? Cholecystitis.  Hx of AAA, multiple abdominal masses.  Will obtain CT abd/pelvis to further evaluate. Obtain cbc, cmet, lipase.  Rx zofran prn pain.

## 2017-07-11 NOTE — Addendum Note (Signed)
Addended by: Kelle Darting A on: 07/11/2017 07:55 AM   Modules accepted: Orders

## 2017-07-12 ENCOUNTER — Telehealth: Payer: Self-pay | Admitting: Family

## 2017-07-12 DIAGNOSIS — K8020 Calculus of gallbladder without cholecystitis without obstruction: Secondary | ICD-10-CM

## 2017-07-12 NOTE — Telephone Encounter (Signed)
Please contact patient and let him know that I reviewed his CT. He does have some gallstones.  I would recommend that he meet with a surgeon to discuss his gallbladder and see if it should be removed.  He did have a kidney stone in his bladder.  He may pass this at some point. Please let us know if he has any pain or difficulty urinating.  Also, He has a cyst on his kidney but this has been stable.

## 2017-07-14 NOTE — Telephone Encounter (Signed)
Robert Bates-- I pended surgical referral for your review. Notified pt's spouse. She states that pt will not be in until late tonight and to go ahead and place surgical referral. Advised her to contact us if they have not been contacted about the referral within 1 week and she voices understanding.

## 2017-07-17 MED FILL — PRAVASTATIN SODIUM 80 MG TA: 80 | 30 days supply | Qty: 30 | Fill #4

## 2017-07-17 MED FILL — OMEPRAZOLE DR 40 MG CAPSULE: 40 | 30 days supply | Qty: 30 | Fill #4

## 2017-07-21 ENCOUNTER — Observation Stay (HOSPITAL_BASED_OUTPATIENT_CLINIC_OR_DEPARTMENT_OTHER)
Admission: EM | Admit: 2017-07-21 | Discharge: 2017-07-24 | Disposition: A | Discharge status: Home / Self Care | Payer: Medicare Other | Attending: Family Medicine | Admitting: Family Medicine

## 2017-07-21 ENCOUNTER — Emergency Department (HOSPITAL_COMMUNITY): Payer: Medicare Other

## 2017-07-21 ENCOUNTER — Encounter (HOSPITAL_BASED_OUTPATIENT_CLINIC_OR_DEPARTMENT_OTHER): Payer: Self-pay | Admitting: *Deleted

## 2017-07-21 DIAGNOSIS — Z7982 Long term (current) use of aspirin: Secondary | ICD-10-CM | POA: Insufficient documentation

## 2017-07-21 DIAGNOSIS — K819 Cholecystitis, unspecified: Secondary | ICD-10-CM | POA: Diagnosis not present

## 2017-07-21 DIAGNOSIS — K219 Gastro-esophageal reflux disease without esophagitis: Secondary | ICD-10-CM | POA: Insufficient documentation

## 2017-07-21 DIAGNOSIS — I10 Essential (primary) hypertension: Secondary | ICD-10-CM | POA: Diagnosis not present

## 2017-07-21 DIAGNOSIS — Z791 Long term (current) use of non-steroidal anti-inflammatories (NSAID): Secondary | ICD-10-CM | POA: Insufficient documentation

## 2017-07-21 DIAGNOSIS — J449 Chronic obstructive pulmonary disease, unspecified: Secondary | ICD-10-CM | POA: Diagnosis not present

## 2017-07-21 DIAGNOSIS — K8000 Calculus of gallbladder with acute cholecystitis without obstruction: Secondary | ICD-10-CM | POA: Diagnosis not present

## 2017-07-21 DIAGNOSIS — Z87891 Personal history of nicotine dependence: Secondary | ICD-10-CM | POA: Insufficient documentation

## 2017-07-21 DIAGNOSIS — E785 Hyperlipidemia, unspecified: Secondary | ICD-10-CM | POA: Insufficient documentation

## 2017-07-21 DIAGNOSIS — I739 Peripheral vascular disease, unspecified: Secondary | ICD-10-CM | POA: Diagnosis not present

## 2017-07-21 DIAGNOSIS — I714 Abdominal aortic aneurysm, without rupture: Secondary | ICD-10-CM | POA: Insufficient documentation

## 2017-07-21 DIAGNOSIS — R109 Unspecified abdominal pain: Secondary | ICD-10-CM

## 2017-07-21 DIAGNOSIS — I4891 Unspecified atrial fibrillation: Secondary | ICD-10-CM | POA: Diagnosis not present

## 2017-07-21 DIAGNOSIS — Z7951 Long term (current) use of inhaled steroids: Secondary | ICD-10-CM | POA: Insufficient documentation

## 2017-07-21 DIAGNOSIS — I480 Paroxysmal atrial fibrillation: Secondary | ICD-10-CM | POA: Diagnosis not present

## 2017-07-21 DIAGNOSIS — R1011 Right upper quadrant pain: Secondary | ICD-10-CM

## 2017-07-21 DIAGNOSIS — Z79899 Other long term (current) drug therapy: Secondary | ICD-10-CM | POA: Insufficient documentation

## 2017-07-21 LAB — CBC WITH DIFFERENTIAL/PLATELET
Basophils Absolute: 0 10*3/uL (ref 0.0–0.1)
Basophils Relative: 0 %
Eosinophils Absolute: 0.1 10*3/uL (ref 0.0–0.7)
Eosinophils Relative: 0 %
HCT: 42.6 % (ref 39.0–52.0)
Hemoglobin: 14.2 g/dL (ref 13.0–17.0)
Lymphocytes Relative: 6 %
Lymphs Abs: 0.8 10*3/uL (ref 0.7–4.0)
MCH: 31 pg (ref 26.0–34.0)
MCHC: 33.3 g/dL (ref 30.0–36.0)
MCV: 93 fL (ref 78.0–100.0)
Monocytes Absolute: 0.8 10*3/uL (ref 0.1–1.0)
Monocytes Relative: 6 %
Neutro Abs: 11.2 10*3/uL — ABNORMAL HIGH (ref 1.7–7.7)
Neutrophils Relative %: 88 %
Platelets: 234 10*3/uL (ref 150–400)
RBC: 4.58 MIL/uL (ref 4.22–5.81)
RDW: 13.3 % (ref 11.5–15.5)
WBC: 12.9 10*3/uL — ABNORMAL HIGH (ref 4.0–10.5)

## 2017-07-21 LAB — I-STAT TROPONIN, ED: Troponin i, poc: 0.01 ng/mL (ref 0.00–0.08)

## 2017-07-21 LAB — COMPREHENSIVE METABOLIC PANEL
ALT: 15 U/L — ABNORMAL LOW (ref 17–63)
AST: 17 U/L (ref 15–41)
Albumin: 3.4 g/dL — ABNORMAL LOW (ref 3.5–5.0)
Alkaline Phosphatase: 73 U/L (ref 38–126)
Anion gap: 8 (ref 5–15)
BUN: 20 mg/dL (ref 6–20)
CO2: 27 mmol/L (ref 22–32)
Calcium: 8.7 mg/dL — ABNORMAL LOW (ref 8.9–10.3)
Chloride: 101 mmol/L (ref 101–111)
Creatinine, Ser: 0.91 mg/dL (ref 0.61–1.24)
GFR calc Af Amer: >60 mL/min (ref >60)
GFR calc non Af Amer: >60 mL/min (ref >60)
Glucose, Bld: 143 mg/dL — ABNORMAL HIGH (ref 65–99)
Potassium: 3.5 mmol/L (ref 3.5–5.1)
Sodium: 136 mmol/L (ref 135–145)
Total Bilirubin: 0.2 mg/dL — ABNORMAL LOW (ref 0.3–1.2)
Total Protein: 6.5 g/dL (ref 6.5–8.1)

## 2017-07-21 LAB — URINALYSIS, ROUTINE W REFLEX MICROSCOPIC
Bilirubin Urine: NEGATIVE
Glucose, UA: NEGATIVE mg/dL
Ketones, ur: NEGATIVE mg/dL
Leukocytes, UA: NEGATIVE
Nitrite: NEGATIVE
Protein, ur: NEGATIVE mg/dL
Specific Gravity, Urine: 1.02 (ref 1.005–1.030)
pH: 7.5 (ref 5.0–8.0)

## 2017-07-21 LAB — URINALYSIS, MICROSCOPIC (REFLEX)
Bacteria, UA: NONE SEEN
Squamous Epithelial / LPF: NONE SEEN

## 2017-07-21 LAB — LIPASE, BLOOD: Lipase: 23 U/L (ref 11–51)

## 2017-07-21 LAB — TROPONIN I: Troponin I: <0.03 ng/mL (ref <0.03)

## 2017-07-21 MED ORDER — PRAVASTATIN SODIUM 40 MG PO TABS
80.0000 mg | ORAL_TABLET | Freq: Every day | ORAL | Status: DC
  Administered 2017-07-22 – 2017-07-24 (×3): 80 mg via ORAL
  Filled 2017-07-21 (×4): qty 2

## 2017-07-21 MED ORDER — ONDANSETRON 4 MG PO TBDP
4.0000 mg | ORAL_TABLET | Freq: Once | ORAL | Status: DC
Start: 2017-07-21 — End: 2017-07-21

## 2017-07-21 MED ORDER — ASPIRIN EC 81 MG PO TBEC
81.0000 mg | DELAYED_RELEASE_TABLET | Freq: Every day | ORAL | Status: DC
  Administered 2017-07-22: 81 mg via ORAL
  Filled 2017-07-21 (×2): qty 1

## 2017-07-21 MED ORDER — FENTANYL CITRATE (PF) 100 MCG/2ML IJ SOLN
50.0000 ug | Freq: Once | INTRAMUSCULAR | Status: AC
  Administered 2017-07-21: 50 ug via INTRAVENOUS
  Filled 2017-07-21: qty 2

## 2017-07-21 MED ORDER — MORPHINE SULFATE (PF) 2 MG/ML IV SOLN: 2.0000 mg | INTRAVENOUS | Status: DC | PRN

## 2017-07-21 MED ORDER — MORPHINE SULFATE (PF) 4 MG/ML IV SOLN
2.0000 mg | INTRAVENOUS | Status: DC | PRN
  Administered 2017-07-21 – 2017-07-23 (×7): 2 mg via INTRAVENOUS
  Filled 2017-07-21 (×7): qty 1

## 2017-07-21 MED ORDER — DEXTROSE 5 % IV SOLN
2.0000 g | Freq: Once | INTRAVENOUS | Status: AC
  Administered 2017-07-21: 2 g via INTRAVENOUS
  Filled 2017-07-21: qty 2

## 2017-07-21 MED ORDER — DEXTROSE-NACL 5-0.45 % IV SOLN
INTRAVENOUS | Status: DC
  Administered 2017-07-21 – 2017-07-22 (×3): via INTRAVENOUS

## 2017-07-21 MED ORDER — HYDROMORPHONE HCL 1 MG/ML IJ SOLN
0.5000 mg | Freq: Once | INTRAMUSCULAR | Status: AC
  Administered 2017-07-21: 0.5 mg via INTRAVENOUS
  Filled 2017-07-21: qty 1

## 2017-07-21 MED ORDER — HEPARIN SODIUM (PORCINE) 5000 UNIT/ML IJ SOLN
5000.0000 [IU] | Freq: Three times a day (TID) | INTRAMUSCULAR | Status: DC
  Administered 2017-07-21 – 2017-07-22 (×3): 5000 [IU] via SUBCUTANEOUS
  Filled 2017-07-21 (×3): qty 1

## 2017-07-21 MED ORDER — SODIUM CHLORIDE 0.9 % IV BOLUS (SEPSIS)
500.0000 mL | Freq: Once | INTRAVENOUS | Status: AC
  Administered 2017-07-21: 500 mL via INTRAVENOUS

## 2017-07-21 MED ORDER — ALBUTEROL SULFATE HFA 108 (90 BASE) MCG/ACT IN AERS: 2.0000 | INHALATION_SPRAY | Freq: Four times a day (QID) | RESPIRATORY_TRACT | Status: DC | PRN

## 2017-07-21 MED ORDER — ONDANSETRON HCL 4 MG/2ML IJ SOLN
4.0000 mg | Freq: Four times a day (QID) | INTRAMUSCULAR | Status: DC | PRN
  Administered 2017-07-21 – 2017-07-23 (×3): 4 mg via INTRAVENOUS
  Filled 2017-07-21 (×2): qty 2

## 2017-07-21 MED ORDER — PIPERACILLIN-TAZOBACTAM 3.375 G IVPB
3.3750 g | Freq: Three times a day (TID) | INTRAVENOUS | Status: DC
  Administered 2017-07-21 – 2017-07-22 (×2): 3.375 g via INTRAVENOUS
  Filled 2017-07-21 (×2): qty 50

## 2017-07-21 MED ORDER — IPRATROPIUM-ALBUTEROL 0.5-2.5 (3) MG/3ML IN SOLN
3.0000 mL | RESPIRATORY_TRACT | Status: DC
  Administered 2017-07-21 (×2): 3 mL via RESPIRATORY_TRACT
  Filled 2017-07-21 (×2): qty 3

## 2017-07-21 MED ORDER — ALBUTEROL SULFATE (2.5 MG/3ML) 0.083% IN NEBU
2.5000 mg | INHALATION_SOLUTION | Freq: Four times a day (QID) | RESPIRATORY_TRACT | Status: DC | PRN
  Administered 2017-07-22: 2.5 mg via RESPIRATORY_TRACT
  Filled 2017-07-21: qty 3

## 2017-07-21 MED ORDER — SODIUM CHLORIDE 0.9 % IV SOLN
Freq: Once | INTRAVENOUS | Status: AC
  Administered 2017-07-21: 03:00:00 via INTRAVENOUS

## 2017-07-21 MED ORDER — MOMETASONE FURO-FORMOTEROL FUM 200-5 MCG/ACT IN AERO
2.0000 | INHALATION_SPRAY | Freq: Two times a day (BID) | RESPIRATORY_TRACT | Status: DC
  Administered 2017-07-23 – 2017-07-24 (×2): 2 via RESPIRATORY_TRACT
  Filled 2017-07-21 (×2): qty 8.8

## 2017-07-21 MED ORDER — PANTOPRAZOLE SODIUM 40 MG IV SOLR
40.0000 mg | INTRAVENOUS | Status: DC
  Administered 2017-07-21 – 2017-07-24 (×4): 40 mg via INTRAVENOUS
  Filled 2017-07-21 (×4): qty 40

## 2017-07-21 MED ORDER — SODIUM CHLORIDE 0.9 % IV SOLN
Freq: Once | INTRAVENOUS | Status: AC
  Administered 2017-07-21: 09:00:00 via INTRAVENOUS

## 2017-07-21 MED ORDER — FENTANYL CITRATE (PF) 100 MCG/2ML IJ SOLN
100.0000 ug | Freq: Once | INTRAMUSCULAR | Status: AC
  Administered 2017-07-21: 100 ug via INTRAVENOUS
  Filled 2017-07-21: qty 2

## 2017-07-21 MED ORDER — IPRATROPIUM-ALBUTEROL 0.5-2.5 (3) MG/3ML IN SOLN
3.0000 mL | Freq: Two times a day (BID) | RESPIRATORY_TRACT | Status: DC
  Administered 2017-07-21 – 2017-07-24 (×6): 3 mL via RESPIRATORY_TRACT
  Filled 2017-07-21 (×6): qty 3

## 2017-07-21 MED ORDER — ONDANSETRON HCL 4 MG/2ML IJ SOLN
4.0000 mg | Freq: Once | INTRAMUSCULAR | Status: AC
  Administered 2017-07-21: 4 mg via INTRAVENOUS
  Filled 2017-07-21: qty 2

## 2017-07-21 NOTE — Progress Notes (Signed)
Please call Hinton Dyer at [phone] at [phone] with report. Andre Lefort

## 2017-07-21 NOTE — Consult Note (Signed)
Reason for Consult: cholecystitis Referring Physician: [name] is an 76 y.o. Bates.  HPI: 76 yo Bates with 2 bouts of right upper quadrant pain in the last 2 months. Pain comes out randomly and lasts several hours. He has nausea and vomiting with the pain. It is worse this time compared to last time. It is worse in the right upper quadrant of the abdomen. It does not radiate.  Past Medical History:  Diagnosis Date  . Alcoholism ([place])    past problem per pt but admits to still drinking - unclear how much  . Anxiety   . Arthritis   . 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  [name] GERD (gastroesophageal reflux disease)   . Hematuria    refuses work up or referral - understands risks of morbidity / mortality - 11/2008, 12/2008  . History of hiatal hernia   . Hyperlipemia   . Kidney cyst, acquired   . Kidney stones   . Meningioma ([place]) 10/25/2013   Follows with Dr. [name].   . Peripheral vascular disease ([place])    Abdominal Aortic Aneursym  . Pneumonia    as a child  . Radiation 09/18/15-10/25/15   left lower lobe 70.2 Gy  . Tobacco abuse     Past Surgical History:  Procedure Laterality Date  . COLONOSCOPY    . EYE SURGERY Bilateral    Cataracts removed w/ lens implant  . HERNIA REPAIR    . TONSILLECTOMY    . VIDEO BRONCHOSCOPY Bilateral 07/26/2015   Procedure: VIDEO BRONCHOSCOPY WITH FLUORO;  Surgeon: [name], MD;  Location: [facility];  Service: Cardiopulmonary;  Laterality: Bilateral;  . VIDEO BRONCHOSCOPY WITH ENDOBRONCHIAL NAVIGATION N/A 08/23/2015   Procedure: VIDEO BRONCHOSCOPY WITH ENDOBRONCHIAL NAVIGATION;  Surgeon: Grace Isaac, MD;  Location: [facility];  Service: Thoracic;  Laterality: N/A;  . VIDEO BRONCHOSCOPY WITH ENDOBRONCHIAL ULTRASOUND N/A 08/23/2015   Procedure: VIDEO BRONCHOSCOPY WITH ENDOBRONCHIAL ULTRASOUND;  Surgeon: Grace Isaac, MD;  Location: [facility];  Service: Thoracic;  Laterality: N/A;    Family History  Problem Relation Age of Onset  . Leukemia Father   . Emphysema Father   . Learning disabilities Son   . Leukemia Other   . Stroke Other     Social History:  reports that he quit smoking about 1 years ago. He has a 57.00 pack-year smoking history. He quit smokeless tobacco use about 58 years ago. His smokeless tobacco use included Chew. He reports that he drinks alcohol. He reports that he does not use drugs.  Allergies:  Allergies  Allergen Reactions  . Iodine     REACTION: neck swells    Medications: I have reviewed the patient's current medications.  Results for orders placed or performed during the hospital encounter of 07/21/17 (from the past 48 hour(s))  CBC with Differential/Platelet     Status: Abnormal   Collection Time: 07/21/17  3:00 AM  Result Value Ref Range   WBC 12.9 (H) 4.0 - 10.5 K/uL   RBC 4.58 4.22 - 5.81 MIL/uL   Hemoglobin 14.2 13.0 - 17.0 g/dL   HCT 42.6 39.0 - 52.0 %   MCV 93.0 78.0 - 100.0 fL   MCH 31.0 26.0 - 34.0 pg   MCHC 33.3 30.0 - 36.0 g/dL   RDW 13.3 11.5 - 15.5 %   Platelets 234 150 - 400 K/uL  Neutrophils Relative % 88 %   Neutro Abs 11.2 (H) 1.7 - 7.7 K/uL   Lymphocytes Relative 6 %   Lymphs Abs 0.8 0.7 - 4.0 K/uL   Monocytes Relative 6 %   Monocytes Absolute 0.8 0.1 - 1.0 K/uL   Eosinophils Relative 0 %   Eosinophils Absolute 0.1 0.0 - 0.7 K/uL   Basophils Relative 0 %   Basophils Absolute 0.0 0.0 - 0.1 K/uL  Comprehensive metabolic panel     Status: Abnormal   Collection Time: 07/21/17  3:00 AM  Result Value Ref Range   Sodium 136 135 - 145 mmol/L   Potassium 3.5 3.5 - 5.1 mmol/L   Chloride 101 101 - 111 mmol/L   CO2 27 22 - 32 mmol/L   Glucose, Bld 143 (H) 65 - 99 mg/dL   BUN 20 6 - 20 mg/dL   Creatinine, Ser 0.91 0.61 - 1.24 mg/dL   Calcium 8.7 (L) 8.9 - 10.3 mg/dL   Total Protein 6.5 6.5 - 8.1 g/dL   Albumin 3.4 (L) 3.5 - 5.0 g/dL   AST 17 15 - 41 U/L   ALT  15 (L) 17 - 63 U/L   Alkaline Phosphatase 73 38 - 126 U/L   Total Bilirubin 0.2 (L) 0.3 - 1.2 mg/dL   GFR calc non Af Amer >60 >60 mL/min   GFR calc Af Amer >60 >60 mL/min    Comment: (NOTE) The eGFR has been calculated using the CKD EPI equation. This calculation has not been validated in all clinical situations. eGFR's persistently <60 mL/min signify possible Chronic Kidney Disease.    Anion gap 8 5 - 15  Lipase, blood     Status: None   Collection Time: 07/21/17  3:00 AM  Result Value Ref Range   Lipase 23 11 - 51 U/L  Urinalysis, Routine w reflex microscopic     Status: Abnormal   Collection Time: 07/21/17  4:16 AM  Result Value Ref Range   Color, Urine YELLOW YELLOW   APPearance CLEAR CLEAR   Specific Gravity, Urine 1.020 1.005 - 1.030   pH 7.5 5.0 - 8.0   Glucose, UA NEGATIVE NEGATIVE mg/dL   Hgb urine dipstick SMALL (A) NEGATIVE   Bilirubin Urine NEGATIVE NEGATIVE   Ketones, ur NEGATIVE NEGATIVE mg/dL   Protein, ur NEGATIVE NEGATIVE mg/dL   Nitrite NEGATIVE NEGATIVE   Leukocytes, UA NEGATIVE NEGATIVE  Urinalysis, Microscopic (reflex)     Status: None   Collection Time: 07/21/17  4:16 AM  Result Value Ref Range   RBC / HPF 6-30 0 - 5 RBC/hpf   WBC, UA 0-5 0 - 5 WBC/hpf   Bacteria, UA NONE SEEN NONE SEEN   Squamous Epithelial / LPF NONE SEEN NONE SEEN  I-stat troponin, ED     Status: None   Collection Time: 07/21/17  6:22 AM  Result Value Ref Range   Troponin i, poc 0.01 0.00 - 0.08 ng/mL   Comment 3            Comment: Due to the release kinetics of cTnI, a negative result within the first hours of the onset of symptoms does not rule out myocardial infarction with certainty. If myocardial infarction is still suspected, repeat the test at appropriate intervals.     US Abdomen Limited Ruq  Result Date: 07/21/2017 CLINICAL DATA:  Generalized abdominal pain and nausea for 1 month. EXAM: ULTRASOUND ABDOMEN LIMITED RIGHT UPPER QUADRANT COMPARISON:  CT scan of  July 11, 2017. FINDINGS: Gallbladder:  Large gallstone measuring 2.9 cm is noted in the neck with moderate gallbladder wall thickening measured at 5 mm and pericholecystic fluid. However, no sonographic Murphy's sign is noted. Common bile duct: Diameter: 5.6 mm which is within normal limits. Liver: 1.3 cm right hepatic cyst is noted. Within normal limits in parenchymal echogenicity. Portal vein is patent on color Doppler imaging with normal direction of blood flow towards the liver. IMPRESSION: Large solitary gallstone is noted in the neck of the gallbladder. Moderate gallbladder wall thickening and pericholecystic fluid is noted concerning for cholecystitis. HIDA scan is recommended for further evaluation. Electronically Signed   By: Marijo Conception, M.D.   On: 07/21/2017 07:51    Review of Systems  Constitutional: Negative for chills and fever.  HENT: Negative for hearing loss.   Eyes: Negative for blurred vision and double vision.  Respiratory: Negative for cough and hemoptysis.   Cardiovascular: Negative for chest pain and palpitations.  Gastrointestinal: Positive for abdominal pain, nausea and vomiting.  Genitourinary: Negative for dysuria and urgency.  Musculoskeletal: Negative for myalgias and neck pain.  Skin: Negative for itching and rash.  Neurological: Negative for dizziness, tingling and headaches.  Endo/Heme/Allergies: Does not bruise/bleed easily.  Psychiatric/Behavioral: Negative for depression and suicidal ideas.   Blood pressure 116/90, pulse 72, temperature 97.7 F (36.5 C), temperature source Oral, resp. rate 16, height '5\' 9"'$  (1.753 m), weight 76.7 kg (169 lb), SpO2 93 %. Physical Exam  Vitals reviewed. Constitutional: He is oriented to person, place, and time. He appears well-developed and well-nourished.  HENT:  Head: Normocephalic and atraumatic.  Eyes: Pupils are equal, round, and reactive to light. Conjunctivae and EOM are normal.  Neck: Normal range of motion.  Neck supple.  Cardiovascular:  Heart rate changing from NSR to afib with ventricular rate of up to 150 during exam  Respiratory: Effort normal and breath sounds normal.  GI: Soft. Bowel sounds are normal. He exhibits no distension. There is tenderness in the right upper quadrant.  Musculoskeletal: Normal range of motion.  Neurological: He is alert and oriented to person, place, and time.  Skin: Skin is warm and dry.  Psychiatric: He has a normal mood and affect. His behavior is normal.    Assessment/Plan: 76 yo Bates with findings of acute cholecystitis with fairly classic symptoms. He also has new onset paroxysmal A fib with ventricular rate intermittently up to 150 during this exam -admit to medicine for cardiac work up -once cardiac risk fully assessed will rediscuss option for lap chole -we will continue to follow  Arta Bruce Kinsinger 07/21/2017, 10:10 AM

## 2017-07-21 NOTE — ED Provider Notes (Signed)
.  patient seen and examined. Patient transferred from [facility], complaining of right upper quadrant pain for several days. Reports one episode of emesis, reports nausea, normal bowel movements, no trouble urinating. Denies any fever or chills. Denies any other associated symptoms.  During the transfer to the emergency department from [facility], patient was found to be tachycardic. EKG showed atrial fibrillation with rapid ventricular response. Patient denies history of the same. Upon my evaluation patient was initially in sinus rhythm with heart rate of 60s, however flipped back to atrial fibrillation as I was leaving the room. I will give him a fluid bolus, will perform right upper quadrant ultrasound given pain, patient had a CT scan for similar pain 9 days ago which showed cholelithiasis without any signs of cholecystitis.  Patient is requesting more pain medications, fentanyl ordered. Will give IV fluid bolus, reassess. Ultrasound ordered.  8:11 AM US showing large solitary gallstone in neck of gallbladder with moderate gallbladder wall thicekning and pericholecystic fluid concerning for cholecystitis. Antibiotics ordered. Pt's hr is currently controlled. Nees more pain medications, will try dilaudid, pt has had very little pain relief with fentanyl. Will page general surgery and medicine for admission.    8:43 AM Spoke with medicine, will admit. Spoke with surgery, we'll combine see patient.  Vitals:   07/21/17 0600 07/21/17 0615 07/21/17 0700 07/21/17 0749  BP:   (!) 148/89   Pulse: 77 63 81   Resp: 20 20 19    Temp:      TempSrc:      SpO2: 98% 96% 95% 93%  Weight:      Height:            Jeannett Senior, PA-C 07/21/17 0852    Palumbo, April, MD 07/24/17 [id number]

## 2017-07-21 NOTE — ED Notes (Signed)
EDP into room 

## 2017-07-21 NOTE — ED Provider Notes (Signed)
[facility] Provider Note: Georgena Spurling, MD, FACEP  CSN: [id number] MRN: [id number] ARRIVAL: 07/21/17 at [place]: [place]  Abdominal Pain   HISTORY OF PRESENT ILLNESS  07/21/17 2:39 AM Robert Bates is a 76 y.o. male with known cholelithiasis recently diagnosed. He is here with his third episode of right upper quadrant abdominal pain this month. It began about 8 PM yesterday evening and has been constant. He describes it as "a pain" which is severe in nature. It is worse with movement or palpation. He has had nausea and vomiting with it. He denies diarrhea or fever. He has taken 2 pain pills without relief. He has follow-up pending with a Psychologist, sport and exercise.    Past Medical History:  Diagnosis Date  . Alcoholism ([place])    past problem per pt but admits to still drinking - unclear how much  . Anxiety   . Arthritis   . 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  [name] GERD (gastroesophageal reflux disease)   . Hematuria    refuses work up or referral - understands risks of morbidity / mortality - 11/2008, 12/2008  . History of hiatal hernia   . Hyperlipemia   . Kidney cyst, acquired   . Kidney stones   . Meningioma ([place]) 10/25/2013   Follows with Dr. [name].   . Peripheral vascular disease ([place])    Abdominal Aortic Aneursym  . Pneumonia    as a child  . Radiation 09/18/15-10/25/15   left lower lobe 70.2 Gy  . Tobacco abuse     Past Surgical History:  Procedure Laterality Date  . COLONOSCOPY    . EYE SURGERY Bilateral    Cataracts removed w/ lens implant  . HERNIA REPAIR    . TONSILLECTOMY    . VIDEO BRONCHOSCOPY Bilateral 07/26/2015   Procedure: VIDEO BRONCHOSCOPY WITH FLUORO;  Surgeon: [name], MD;  Location: [facility];  Service: Cardiopulmonary;  Laterality: Bilateral;  . VIDEO BRONCHOSCOPY WITH ENDOBRONCHIAL NAVIGATION N/A 08/23/2015   Procedure:  VIDEO BRONCHOSCOPY WITH ENDOBRONCHIAL NAVIGATION;  Surgeon: Grace Isaac, MD;  Location: [facility];  Service: Thoracic;  Laterality: N/A;  . VIDEO BRONCHOSCOPY WITH ENDOBRONCHIAL ULTRASOUND N/A 08/23/2015   Procedure: VIDEO BRONCHOSCOPY WITH ENDOBRONCHIAL ULTRASOUND;  Surgeon: Grace Isaac, MD;  Location: [facility];  Service: Thoracic;  Laterality: N/A;    Family History  Problem Relation Age of Onset  . Leukemia Father   . Emphysema Father   . Learning disabilities Son   . Leukemia Other   . Stroke Other     Social History  Substance Use Topics  . Smoking status: Former Smoker    Packs/day: 1.00    Years: 57.00    Quit date: 08/08/2015  . Smokeless tobacco: Former Systems developer    Types: Chew    Quit date: 11/04/1958  . Alcohol use 0.0 oz/week     Comment: Pt reports rare use    Prior to Admission medications   Medication Sig Start Date End Date Taking? Authorizing Provider  albuterol (PROVENTIL HFA;VENTOLIN HFA) 108 (90 Base) MCG/ACT inhaler Inhale 2 puffs into the lungs every 6 (six) hours as needed for wheezing or shortness of breath. 03/14/17   [name], NP  aspirin EC 81 MG tablet Take 1 tablet (81 mg total) by mouth daily. 06/17/17   [name], NP  budesonide-formoterol [facility]) 160-4.5 MCG/ACT inhaler Inhale  2 puffs into the lungs 2 (two) times daily. 03/14/17   [name], NP  diphenhydrAMINE (BENADRYL) 50 MG tablet Take 1 tablet ( 50mg ) 1 hour before Scan 07/11/17   [name], NP  meloxicam (MOBIC) 7.5 MG tablet TAKE 1 TABLET (7.5 MG TOTAL) BY MOUTH DAILY. 06/17/17   [name], NP  omeprazole (PRILOSEC) 40 MG capsule Take 1 capsule (40 mg total) by mouth daily. PATIENT NEEDS OFFICE VISIT FOR FURTHER REFILLS 03/14/17   [name], NP  ondansetron (ZOFRAN) 4 MG tablet Take 1 tablet (4 mg total) by mouth every 8 (eight) hours as needed for nausea or vomiting. 07/11/17   [name], NP  pravastatin (PRAVACHOL) 80 MG tablet  Take 1 tablet (80 mg total) by mouth daily. 03/14/17   [name], NP  predniSONE (DELTASONE) 50 MG tablet Take 1 tablet 13 hours prior to scan, take 1 tablet 7 hours prior to scan and take 1 tablet 1 hour prior to scan. 07/11/17   [name], NP  traMADol (ULTRAM) 50 MG tablet Take 1 tablet (50 mg total) by mouth every 6 (six) hours as needed. 07/11/17   [name], NP    Allergies Iodine   REVIEW OF SYSTEMS  Negative except as noted here or in the History of Present Illness.   PHYSICAL EXAMINATION  Initial Vital Signs Blood pressure (!) 155/92, pulse 69, temperature 98.4 F (36.9 C), temperature source Oral, resp. rate 18, height 5\' 9"  (1.753 m), weight 76.7 kg (169 lb), SpO2 97 %.  Examination General: Well-developed, well-nourished male in no acute distress; appearance consistent with age of record HENT: normocephalic; atraumatic Eyes: pupils equal, round and reactive to light; extraocular muscles intact; arcus senilis bilaterally; lens implants Neck: supple Heart: regular rate and rhythm Lungs: clear to auscultation bilaterally Abdomen: soft; nondistended; right upper quadrant tenderness; bowel sounds present Extremities: No deformity; full range of motion; 2+ pitting edema of lower legs Neurologic: Awake, alert and oriented; motor function intact in all extremities and symmetric; no facial droop Skin: Warm and dry Psychiatric: In obvious discomfort   RESULTS  Summary of this visit's results, reviewed by myself:   EKG Interpretation  Date/Time:    Ventricular Rate:    PR Interval:    QRS Duration:   QT Interval:    QTC Calculation:   R Axis:     Text Interpretation:        Laboratory Studies: Results for orders placed or performed during the hospital encounter of 07/21/17 (from the past 24 hour(s))  CBC with Differential/Platelet     Status: Abnormal   Collection Time: 07/21/17  3:00 AM  Result Value Ref Range   WBC 12.9 (H) 4.0 - 10.5  K/uL   RBC 4.58 4.22 - 5.81 MIL/uL   Hemoglobin 14.2 13.0 - 17.0 g/dL   HCT 42.6 39.0 - 52.0 %   MCV 93.0 78.0 - 100.0 fL   MCH 31.0 26.0 - 34.0 pg   MCHC 33.3 30.0 - 36.0 g/dL   RDW 13.3 11.5 - 15.5 %   Platelets 234 150 - 400 K/uL   Neutrophils Relative % 88 %   Neutro Abs 11.2 (H) 1.7 - 7.7 K/uL   Lymphocytes Relative 6 %   Lymphs Abs 0.8 0.7 - 4.0 K/uL   Monocytes Relative 6 %   Monocytes Absolute 0.8 0.1 - 1.0 K/uL   Eosinophils Relative 0 %   Eosinophils Absolute 0.1 0.0 - 0.7 K/uL   Basophils Relative 0 %  Basophils Absolute 0.0 0.0 - 0.1 K/uL  Comprehensive metabolic panel     Status: Abnormal   Collection Time: 07/21/17  3:00 AM  Result Value Ref Range   Sodium 136 135 - 145 mmol/L   Potassium 3.5 3.5 - 5.1 mmol/L   Chloride 101 101 - 111 mmol/L   CO2 27 22 - 32 mmol/L   Glucose, Bld 143 (H) 65 - 99 mg/dL   BUN 20 6 - 20 mg/dL   Creatinine, Ser 0.91 0.61 - 1.24 mg/dL   Calcium 8.7 (L) 8.9 - 10.3 mg/dL   Total Protein 6.5 6.5 - 8.1 g/dL   Albumin 3.4 (L) 3.5 - 5.0 g/dL   AST 17 15 - 41 U/L   ALT 15 (L) 17 - 63 U/L   Alkaline Phosphatase 73 38 - 126 U/L   Total Bilirubin 0.2 (L) 0.3 - 1.2 mg/dL   GFR calc non Af Amer >60 >60 mL/min   GFR calc Af Amer >60 >60 mL/min   Anion gap 8 5 - 15  Lipase, blood     Status: None   Collection Time: 07/21/17  3:00 AM  Result Value Ref Range   Lipase 23 11 - 51 U/L   Imaging Studies: No results found.  ED COURSE  Nursing notes and initial vitals signs, including pulse oximetry, reviewed.  Vitals:   07/21/17 0229 07/21/17 0236 07/21/17 0257 07/21/17 0300  BP: (!) 155/92  (!) 145/94 (!) 152/88  Pulse: 69  72 67  Resp: 18     Temp: 98.4 F (36.9 C)     TempSrc: Oral     SpO2: 97%  90% 93%  Weight:  76.7 kg (169 lb)    Height:  5\' 9"  (1.753 m)     3:54 AM Pain improved but still present after IV fentanyl. Supplemental oxygen given for reduced oxygen saturation. Right upper quadrant tenderness persists. Unable to  visualize gallbladder with bedside ultrasound. Will transfer to [facility] ED for emergent ultrasound. This is a better modality for evaluating for acute cholecystitis and we wish to avoid further CT scans as the patient has had numerous CT scans in the past 2 years.  PROCEDURES    ED DIAGNOSES     ICD-10-CM   1. RUQ abdominal pain R10.11        Tatyana Biber, MD 07/21/17 [phone]

## 2017-07-21 NOTE — Progress Notes (Signed)
Pharmacy Antibiotic Note  Eleno Weimar is a 76 y.o. male admitted on 07/21/2017 with acute cholecystitis.  Patient received 1 dose of Ceftriaxone in ED and will admitted for surgery.  While awaiting surgery, pharmacy has been consulted for Zosyn dosing for empiric coverage.  Plan: Zosyn 3.375g IV q8h (4 hour infusion time).  Patient's renal function WNL so unlikely to need dose adjustments. Pharmacy will sign off at this time. Please re-consult if needed.  Height: 5\' 9"  (175.3 cm) Weight: 169 lb (76.7 kg) IBW/kg (Calculated) : 70.7  Temp (24hrs), Avg:97.9 F (36.6 C), Min:97.7 F (36.5 C), Max:98.4 F (36.9 C)   Recent Labs Lab 07/21/17 0300  WBC 12.9*  CREATININE 0.91    Estimated Creatinine Clearance: 69.1 mL/min (by C-G formula based on SCr of 0.91 mg/dL).    Allergies  Allergen Reactions  . Iodine     REACTION: neck swells    Thank you for allowing pharmacy to be a part of this patient's care.  Hershal Coria 07/21/2017 4:01 PM

## 2017-07-21 NOTE — H&P (Signed)
History and Physical  Robert Bates JGG:[id number] DOB: [birth year] DOA: 07/21/2017  PCP:  [name], NP   Chief Complaint:  Abdominal pain  History of Present Illness:  Pt is a 76 yo male with hx of gallstones diagnosed last month who came with cc of abdominal pain, epigastric, moderate to severe, non radiating, associated with nausea/vomiting w/o diarrhea/constipatoin/dysuria. No fever/chills. No other complaints. CT scan 10 days ago showed gallstones w/o cholecystitis but [place] today shows cholecystitis. EKG showed new onset afib. Pt denied hx of CAD/arrythemia   Review of Systems:  CONSTITUTIONAL:     No night sweats.  No fatigue.  No fever. No chills. Eyes:                            No visual changes.  No eye pain.  No eye discharge.   ENT:                              No epistaxis.  No sinus pain.  No sore throat.   No congestion. RESPIRATORY:           No cough.  No wheeze.  No hemoptysis.  No dyspnea CARDIOVASCULAR   :  No chest pains.  No palpitations. GASTROINTESTINAL:  +abdominal pain.  +nausea. +vomiting.  No diarrhea. No    constipation.  No hematemesis.  No hematochezia.  No melena. GENITOURINARY:      No urgency.  No frequency.  No dysuria.  No hematuria.  No    obstructive symptoms.  No discharge.  No pain.  MUSCULOSKELETAL:  No musculoskeletal pain.  No joint swelling.  No arthritis. NEUROLOGICAL:        No confusion.  No weakness. No headache. No seizure. PSYCHIATRIC:             No depression. No anxiety. No suicidal ideation. SKIN:                             No rashes.  No lesions.  No wounds. ENDOCRINE:                No weight loss.  No polydipsia.  No polyuria.  No polyphagia. HEMATOLOGIC:           No purpura.  No petechiae.  No bleeding.  ALLERGIC                 : No pruritus.  No angioedema Other:  Past Medical and Surgical History:   Past Medical History:  Diagnosis Date  . Alcoholism ([place])    past problem per pt but admits to still  drinking - unclear how much  . Anxiety   . Arthritis   . 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  [name] GERD (gastroesophageal reflux disease)   . Hematuria    refuses work up or referral - understands risks of morbidity / mortality - 11/2008, 12/2008  . History of hiatal hernia   . Hyperlipemia   . Kidney cyst, acquired   . Kidney stones   . Meningioma ([place]) 10/25/2013   Follows with Dr. [name].   . Peripheral vascular disease ([place])    Abdominal Aortic Aneursym  . Pneumonia    as a child  . Radiation 09/18/15-10/25/15  left lower lobe 70.2 Gy  . Tobacco abuse    Past Surgical History:  Procedure Laterality Date  . COLONOSCOPY    . EYE SURGERY Bilateral    Cataracts removed w/ lens implant  . HERNIA REPAIR    . TONSILLECTOMY    . VIDEO BRONCHOSCOPY Bilateral 07/26/2015   Procedure: VIDEO BRONCHOSCOPY WITH FLUORO;  Surgeon: [name], MD;  Location: [facility];  Service: Cardiopulmonary;  Laterality: Bilateral;  . VIDEO BRONCHOSCOPY WITH ENDOBRONCHIAL NAVIGATION N/A 08/23/2015   Procedure: VIDEO BRONCHOSCOPY WITH ENDOBRONCHIAL NAVIGATION;  Surgeon: Grace Isaac, MD;  Location: [facility];  Service: Thoracic;  Laterality: N/A;  . VIDEO BRONCHOSCOPY WITH ENDOBRONCHIAL ULTRASOUND N/A 08/23/2015   Procedure: VIDEO BRONCHOSCOPY WITH ENDOBRONCHIAL ULTRASOUND;  Surgeon: Grace Isaac, MD;  Location: [facility];  Service: Thoracic;  Laterality: N/A;    Social History:   reports that he quit smoking about 1 years ago. He has a 57.00 pack-year smoking history. He quit smokeless tobacco use about 58 years ago. His smokeless tobacco use included Chew. He reports that he drinks alcohol. He reports that he does not use drugs.   Allergies  Allergen Reactions  . Iodine     REACTION: neck swells    Family History  Problem Relation Age of Onset  . Leukemia Father   . Emphysema Father   .  Learning disabilities Son   . Leukemia Other   . Stroke Other       Prior to Admission medications   Medication Sig Start Date End Date Taking? Authorizing Provider  albuterol (PROVENTIL HFA;VENTOLIN HFA) 108 (90 Base) MCG/ACT inhaler Inhale 2 puffs into the lungs every 6 (six) hours as needed for wheezing or shortness of breath. 03/14/17  Yes [name], NP  aspirin EC 81 MG tablet Take 1 tablet (81 mg total) by mouth daily. 06/17/17  Yes [name], NP  budesonide-formoterol (SYMBICORT) 160-4.5 MCG/ACT inhaler Inhale 2 puffs into the lungs 2 (two) times daily. 03/14/17  Yes [name], NP  meloxicam (MOBIC) 7.5 MG tablet TAKE 1 TABLET (7.5 MG TOTAL) BY MOUTH DAILY. 06/17/17  Yes [name], NP  omeprazole (PRILOSEC) 40 MG capsule Take 1 capsule (40 mg total) by mouth daily. PATIENT NEEDS OFFICE VISIT FOR FURTHER REFILLS 03/14/17  Yes [name], NP  ondansetron (ZOFRAN) 4 MG tablet Take 1 tablet (4 mg total) by mouth every 8 (eight) hours as needed for nausea or vomiting. 07/11/17  Yes [name], NP  pravastatin (PRAVACHOL) 80 MG tablet Take 1 tablet (80 mg total) by mouth daily. 03/14/17  Yes [name], NP  traMADol (ULTRAM) 50 MG tablet Take 1 tablet (50 mg total) by mouth every 6 (six) hours as needed. 07/11/17  Yes [name], NP  diphenhydrAMINE (BENADRYL) 50 MG tablet Take 1 tablet ( 50mg ) 1 hour before Scan Patient not taking: Reported on 07/21/2017 07/11/17   [name], NP  predniSONE (DELTASONE) 50 MG tablet Take 1 tablet 13 hours prior to scan, take 1 tablet 7 hours prior to scan and take 1 tablet 1 hour prior to scan. Patient not taking: Reported on 07/21/2017 07/11/17   [name], NP    Physical Exam: BP (!) 106/59   Pulse 67   Temp 97.7 F (36.5 C) (Oral)   Resp 17   Ht 5\' 9"  (1.753 m)   Wt 76.7 kg (169 lb)   SpO2 94%   BMI 24.96 kg/m   GENERAL :   Alert and cooperative, and appears  to be in  no acute distress. HEAD:           normocephalic. EYES:            PERRL, EOMI.  vision is grossly intact. EARS:           hearing grossly intact. NECK:          supple CARDIAC:    Normal S1 and S2. No gallop. No murmurs.  Vascular:     no peripheral edema. LUNGS:       Clear to auscultation  ABDOMEN: Positive bowel sounds. Soft, nondistended, RUQ tenderness. MSK:           No joint erythema or tenderness.  EXT           : No significant deformity or joint abnormality. Neuro        : Alert, oriented to person, place, and time.                      CN II-XII intact.  SKIN:            No rash. No lesions. PSYCH:       No hallucination. Patient is not suicidal.          Labs on Admission:  Reviewed.   Radiological Exams on Admission: US Abdomen Limited Ruq  Result Date: 07/21/2017 CLINICAL DATA:  Generalized abdominal pain and nausea for 1 month. EXAM: ULTRASOUND ABDOMEN LIMITED RIGHT UPPER QUADRANT COMPARISON:  CT scan of July 11, 2017. FINDINGS: Gallbladder: Large gallstone measuring 2.9 cm is noted in the neck with moderate gallbladder wall thickening measured at 5 mm and pericholecystic fluid. However, no sonographic Murphy's sign is noted. Common bile duct: Diameter: 5.6 mm which is within normal limits. Liver: 1.3 cm right hepatic cyst is noted. Within normal limits in parenchymal echogenicity. Portal vein is patent on color Doppler imaging with normal direction of blood flow towards the liver. IMPRESSION: Large solitary gallstone is noted in the neck of the gallbladder. Moderate gallbladder wall thickening and pericholecystic fluid is noted concerning for cholecystitis. HIDA scan is recommended for further evaluation. Electronically Signed   By: Marijo Conception, M.D.   On: 07/21/2017 07:51    EKG:  Independently reviewed. afib  Assessment/Plan  Acute cholecystitis:  Seen by surgery cholecystectomy pending cardiac risk eval: will consult cardiology.  Keep pt NPO, PPI daily,  zofran prn, morphine prn Pt is likely high risk due to new onset afib but that should not preclude surgical intervention if it's urgent/emergent ; pt is already rate controlled and monitor seems in sinus.   Atrial fibrillationr: Likely related to underlying hypertensive heart disease and/or CAD. EKG showing afib Echo ordered  New onset  CHADS2 score : HTN, DM, CHF, Stroke#2, Age : 1 ; cont asp for now awaiting cardio eval.  Rate/Rythm control: none needed for now Rule out MI : serial trops and EKGs.  Hx of lung cancer; in remission                                                                 Input & Output: NA Lines & Tubes: PIV DVT prophylaxis: [place] hep GI prophylaxis: PPI Consultants: Gen surgery  Code Status: full  Family Communication: at  bedside   Disposition Plan: TBD    Gennaro Africa M.D [facility]

## 2017-07-21 NOTE — ED Notes (Signed)
Pt transferred from [facility] via [facility] for further evaluation of abdominal pain.  Per [facility], pt was placed on cardiac monitor en route:  EKG showed RVR at the high 160's.   Pt denies chest pain or shortness of breath.

## 2017-07-21 NOTE — ED Notes (Addendum)
Placed on O2 [place] 2 L for low SPO2 after fentanyl. RA prior to med was 91%. RA after med was 84%. H/o COPD. SPO2 on 2 L 92%

## 2017-07-21 NOTE — ED Triage Notes (Signed)
C/o recurrent RUQ pain, relates to gall bladder, "suppose to have taken it out, suppose to have f/u with specialist", vomited x1 PTA, (denies: fever, diarrhea, bleeding, dizziness or other sx), took 2 "pain pills" PTA w/o relief.   Alert, NAD, calm, interactive, resps e/u, speaking in clear complete sentences, no dyspnea noted, skin W&D.

## 2017-07-22 ENCOUNTER — Inpatient Hospital Stay (HOSPITAL_COMMUNITY): Payer: Medicare Other

## 2017-07-22 DIAGNOSIS — I480 Paroxysmal atrial fibrillation: Secondary | ICD-10-CM | POA: Diagnosis not present

## 2017-07-22 DIAGNOSIS — I5030 Unspecified diastolic (congestive) heart failure: Secondary | ICD-10-CM

## 2017-07-22 DIAGNOSIS — K819 Cholecystitis, unspecified: Secondary | ICD-10-CM | POA: Diagnosis not present

## 2017-07-22 DIAGNOSIS — I10 Essential (primary) hypertension: Secondary | ICD-10-CM

## 2017-07-22 LAB — CBC
HCT: 40.4 % (ref 39.0–52.0)
Hemoglobin: 13.6 g/dL (ref 13.0–17.0)
MCH: 30.6 pg (ref 26.0–34.0)
MCHC: 33.7 g/dL (ref 30.0–36.0)
MCV: 91 fL (ref 78.0–100.0)
Platelets: 201 10*3/uL (ref 150–400)
RBC: 4.44 MIL/uL (ref 4.22–5.81)
RDW: 13.5 % (ref 11.5–15.5)
WBC: 16.4 10*3/uL — ABNORMAL HIGH (ref 4.0–10.5)

## 2017-07-22 LAB — COMPREHENSIVE METABOLIC PANEL
ALT: 12 U/L — ABNORMAL LOW (ref 17–63)
AST: 14 U/L — ABNORMAL LOW (ref 15–41)
Albumin: 3 g/dL — ABNORMAL LOW (ref 3.5–5.0)
Alkaline Phosphatase: 71 U/L (ref 38–126)
Anion gap: 8 (ref 5–15)
BUN: 9 mg/dL (ref 6–20)
CO2: 29 mmol/L (ref 22–32)
Calcium: 8.3 mg/dL — ABNORMAL LOW (ref 8.9–10.3)
Chloride: 100 mmol/L — ABNORMAL LOW (ref 101–111)
Creatinine, Ser: 0.78 mg/dL (ref 0.61–1.24)
GFR calc Af Amer: >60 mL/min (ref >60)
GFR calc non Af Amer: >60 mL/min (ref >60)
Glucose, Bld: 137 mg/dL — ABNORMAL HIGH (ref 65–99)
Potassium: 3.3 mmol/L — ABNORMAL LOW (ref 3.5–5.1)
Sodium: 137 mmol/L (ref 135–145)
Total Bilirubin: 0.6 mg/dL (ref 0.3–1.2)
Total Protein: 5.9 g/dL — ABNORMAL LOW (ref 6.5–8.1)

## 2017-07-22 LAB — ECHOCARDIOGRAM COMPLETE
Height: 69 in
Weight: 2704 oz

## 2017-07-22 LAB — HEPARIN LEVEL (UNFRACTIONATED): Heparin Unfractionated: 0.42 IU/mL (ref 0.30–0.70)

## 2017-07-22 LAB — MAGNESIUM: Magnesium: 1.5 mg/dL — ABNORMAL LOW (ref 1.7–2.4)

## 2017-07-22 LAB — TSH: TSH: 1.023 u[IU]/mL (ref 0.350–4.500)

## 2017-07-22 MED ORDER — HEPARIN (PORCINE) IN NACL 100-0.45 UNIT/ML-% IJ SOLN
1100.0000 [IU]/h | INTRAMUSCULAR | Status: DC
  Administered 2017-07-22 – 2017-07-23 (×2): 1100 [IU]/h via INTRAVENOUS
  Filled 2017-07-22 (×2): qty 250

## 2017-07-22 MED ORDER — DILTIAZEM HCL ER COATED BEADS 120 MG PO CP24
120.0000 mg | ORAL_CAPSULE | Freq: Every day | ORAL | Status: DC
  Administered 2017-07-22 – 2017-07-24 (×3): 120 mg via ORAL
  Filled 2017-07-22 (×3): qty 1

## 2017-07-22 MED ORDER — HEPARIN BOLUS VIA INFUSION
4000.0000 [IU] | Freq: Once | INTRAVENOUS | Status: AC
  Administered 2017-07-22: 4000 [IU] via INTRAVENOUS
  Filled 2017-07-22: qty 4000

## 2017-07-22 MED ORDER — POTASSIUM CHLORIDE CRYS ER 20 MEQ PO TBCR
40.0000 meq | EXTENDED_RELEASE_TABLET | Freq: Once | ORAL | Status: AC
  Administered 2017-07-22: 40 meq via ORAL
  Filled 2017-07-22: qty 2

## 2017-07-22 MED ORDER — MAGNESIUM SULFATE 2 GM/50ML IV SOLN
2.0000 g | Freq: Once | INTRAVENOUS | Status: AC
  Administered 2017-07-22: 2 g via INTRAVENOUS
  Filled 2017-07-22: qty 50

## 2017-07-22 NOTE — Care Management CC44 (Signed)
Condition Code 44 Documentation Completed  Patient Details  Name: Millan Legan MRN: [id number] Date of Birth: [birth year]   Condition Code 44 given:  Yes Patient signature on Condition Code 44 notice:  Yes Documentation of 2 MD's agreement:  Yes Code 44 added to claim:  Yes    Apolonio Schneiders, RN 07/22/2017, 7:27 PM

## 2017-07-22 NOTE — Progress Notes (Signed)
ANTICOAGULATION CONSULT NOTE - Follow Up  Assessment: Please see pharmacist note from earlier today for further detail. Following up heparin level this evening for new onset afib.  First heparin level therapeutic at 0.42 units/ml following 4000 unit bolus and infusion at 1100 units/hr. No bleeding/complications reported.   Goal of Therapy:  Heparin level 0.3-0.7 units/ml Monitor platelets by anticoagulation protocol: Yes   Plan:  - continue heparin infusion at 1100 units/hr - check 8 hr heparin level to confirm - monitor for s/s bleeding - CCS Team-- If patient to go to OR on 07/23/17, please advise when you want heparin to be turned off prior to procedure.  Hershal Coria 07/22/2017,9:10 PM

## 2017-07-22 NOTE — Progress Notes (Signed)
[facility] for heparin Indication: new onset atrial fibrillation  Allergies  Allergen Reactions  . Iodine     REACTION: neck swells    Patient Measurements: Height: 5\' 9"  (175.3 cm) Weight: 169 lb (76.7 kg) IBW/kg (Calculated) : 70.7 Heparin Dosing Weight: 76 kg  Vital Signs: Temp: 99.1 F (37.3 C) (09/18 0528) Temp Source: Oral (09/18 0528) BP: 105/63 (09/18 0528) Pulse Rate: 80 (09/18 0528)  Labs:  Recent Labs  07/21/17 0300 07/21/17 1250 07/22/17 0459  HGB 14.2  --  13.6  HCT 42.6  --  40.4  PLT 234  --  201  CREATININE 0.91  --  0.78  TROPONINI  --  <0.03  --     Estimated Creatinine Clearance: 78.6 mL/min (by C-G formula based on SCr of 0.78 mg/dL).   Medical History: Past Medical History:  Diagnosis Date  . Alcoholism ([place])    past problem per pt but admits to still drinking - unclear how much  . Anxiety   . Arthritis   . 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  [name] GERD (gastroesophageal reflux disease)   . Hematuria    refuses work up or referral - understands risks of morbidity / mortality - 11/2008, 12/2008  . History of hiatal hernia   . Hyperlipemia   . Kidney cyst, acquired   . Kidney stones   . Meningioma ([place]) 10/25/2013   Follows with Dr. [name].   . Peripheral vascular disease ([place])    Abdominal Aortic Aneursym  . Pneumonia    as a child  . Radiation 09/18/15-10/25/15   left lower lobe 70.2 Gy  . Tobacco abuse     Assessment: Patient is a 76 y.o M presented to the ED on 07/21/17 with c/o RUQ abd pain and was found to have acute cholecystitis.  Plan for possible cholecystectomy on 07/23/17.  To start heparin drip for new onset afib.  Today, 07/22/2017: - cbc stable - no bleeding documented - scr ok - pt received heparin 5000 units SQ at 0600 this morning  Goal of Therapy:  Heparin level 0.3-0.7  units/ml Monitor platelets by anticoagulation protocol: Yes   Plan:  - heparin 4000 units IV x1 bolus, then heparin drip at 1100 units/hr - check 8 hr heparin level - monitor for s/s bleeding - CCS Team-- If patient to go to OR on 07/23/17, please advise when you want heparin to be turned off prior to procedure.  Raden Byington P 07/22/2017,11:35 AM

## 2017-07-22 NOTE — Consult Note (Signed)
Cardiology Consultation:   Patient ID: Robert Bates; [id number]; Mar 08, 1941   Admit date: 07/21/2017 Date of Consult: 07/22/2017  Primary Care Provider: [name], NP Primary Cardiologist: new - Dr. Acie Fredrickson Primary Electrophysiologist:     Patient Profile:   Robert Bates is a 76 y.o. male with a hx of tobacco abuse, alcoholism, lung cancer, COPD, PVD, HLD, GERD, AAA, HTN,  who is being seen today for the evaluation of Afib at the request of Dr. Maylene Roes.  History of Present Illness:   Mr. Henery does not currently see a cardiologist. He was admitted to [facility] from [place] with abdominal complaints and found to have acute cholecystitis. Surgery was consulted. He was also found to be in Afib RVR during transport, confirmed by EKG. Per EPIC review, it appears he spontaneously converted back to NSR without intervention with medications or cardioversion. However, he continues to have bouts of fib/flutter on telemetry.   On my exam, he denies feeling palpitations, chest pain, SOB, dizziness, and feelings of syncope. He is asymptomatic, so it is unclear how long he has had paroxysmal Afib/flutter. He is currently in NSR, but has bouts of RVR.   Surgical team awaiting cardiology input before proceeding.   Past Medical History:  Diagnosis Date  . Alcoholism ([place])    past problem per pt but admits to still drinking - unclear how much  . Anxiety   . Arthritis   . 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  [name] GERD (gastroesophageal reflux disease)   . Hematuria    refuses work up or referral - understands risks of morbidity / mortality - 11/2008, 12/2008  . History of hiatal hernia   . Hyperlipemia   . Kidney cyst, acquired   . Kidney stones   . Meningioma ([place]) 10/25/2013   Follows with Dr. [name].   . Peripheral vascular disease ([place])    Abdominal Aortic Aneursym  . Pneumonia    as  a child  . Radiation 09/18/15-10/25/15   left lower lobe 70.2 Gy  . Tobacco abuse     Past Surgical History:  Procedure Laterality Date  . COLONOSCOPY    . EYE SURGERY Bilateral    Cataracts removed w/ lens implant  . HERNIA REPAIR    . TONSILLECTOMY    . VIDEO BRONCHOSCOPY Bilateral 07/26/2015   Procedure: VIDEO BRONCHOSCOPY WITH FLUORO;  Surgeon: [name], MD;  Location: [facility];  Service: Cardiopulmonary;  Laterality: Bilateral;  . VIDEO BRONCHOSCOPY WITH ENDOBRONCHIAL NAVIGATION N/A 08/23/2015   Procedure: VIDEO BRONCHOSCOPY WITH ENDOBRONCHIAL NAVIGATION;  Surgeon: Grace Isaac, MD;  Location: [place];  Service: Thoracic;  Laterality: N/A;  . VIDEO BRONCHOSCOPY WITH ENDOBRONCHIAL ULTRASOUND N/A 08/23/2015   Procedure: VIDEO BRONCHOSCOPY WITH ENDOBRONCHIAL ULTRASOUND;  Surgeon: Grace Isaac, MD;  Location: [place];  Service: Thoracic;  Laterality: N/A;     Home Medications:  Prior to Admission medications   Medication Sig Start Date End Date Taking? Authorizing Provider  albuterol (PROVENTIL HFA;VENTOLIN HFA) 108 (90 Base) MCG/ACT inhaler Inhale 2 puffs into the lungs every 6 (six) hours as needed for wheezing or shortness of breath. 03/14/17  Yes [name], NP  aspirin EC 81 MG tablet Take 1 tablet (81 mg total) by mouth daily. 06/17/17  Yes [name], NP  budesonide-formoterol (SYMBICORT) 160-4.5 MCG/ACT inhaler Inhale 2 puffs into the lungs 2 (two) times daily. 03/14/17  Yes [name], NP  meloxicam (MOBIC) 7.5 MG tablet TAKE 1 TABLET (7.5 MG TOTAL) BY MOUTH DAILY. 06/17/17  Yes [name], NP  omeprazole (PRILOSEC) 40 MG capsule Take 1 capsule (40 mg total) by mouth daily. PATIENT NEEDS OFFICE VISIT FOR FURTHER REFILLS 03/14/17  Yes [name], NP  ondansetron (ZOFRAN) 4 MG tablet Take 1 tablet (4 mg total) by mouth every 8 (eight) hours as needed for nausea or vomiting. 07/11/17  Yes [name], NP  pravastatin  (PRAVACHOL) 80 MG tablet Take 1 tablet (80 mg total) by mouth daily. 03/14/17  Yes [name], NP  traMADol (ULTRAM) 50 MG tablet Take 1 tablet (50 mg total) by mouth every 6 (six) hours as needed. 07/11/17  Yes [name], NP  diphenhydrAMINE (BENADRYL) 50 MG tablet Take 1 tablet ( 50mg ) 1 hour before Scan Patient not taking: Reported on 07/21/2017 07/11/17   [name], NP  predniSONE (DELTASONE) 50 MG tablet Take 1 tablet 13 hours prior to scan, take 1 tablet 7 hours prior to scan and take 1 tablet 1 hour prior to scan. Patient not taking: Reported on 07/21/2017 07/11/17   [name], NP    Inpatient Medications: Scheduled Meds: . aspirin EC  81 mg Oral Daily  . heparin  5,000 Units Subcutaneous Q8H  . ipratropium-albuterol  3 mL Nebulization BID  . mometasone-formoterol  2 puff Inhalation BID  . pantoprazole (PROTONIX) IV  40 mg Intravenous Q24H  . potassium chloride  40 mEq Oral Once  . pravastatin  80 mg Oral Daily   Continuous Infusions: . dextrose 5 % and 0.45% NaCl 100 mL/hr at 07/21/17 2242  . piperacillin-tazobactam (ZOSYN)  IV 3.375 g (07/22/17 0552)   PRN Meds: albuterol, morphine injection, ondansetron (ZOFRAN) IV  Allergies:    Allergies  Allergen Reactions  . Iodine     REACTION: neck swells    Social History:   Social History   Social History  . Marital status: Married    Spouse name: N/A  . Number of children: 2  . Years of education: N/A   Occupational History  . Retired Scientist, research (medical)  .  [employer]   Social History Main Topics  . Smoking status: Former Smoker    Packs/day: 1.00    Years: 57.00    Quit date: 08/08/2015  . Smokeless tobacco: Former Systems developer    Types: Chew    Quit date: 11/04/1958  . Alcohol use 0.0 oz/week     Comment: Pt reports rare use  . Drug use: No  . Sexual activity: Not on file   Other Topics Concern  . Not on file   Social History Narrative  . No narrative on file      Family History:    Family History  Problem Relation Age of Onset  . Leukemia Father   . Emphysema Father   . Learning disabilities Son   . Leukemia Other   . Stroke Other      ROS:  Please see the history of present illness.  ROS  All other ROS reviewed and negative.     Physical Exam/Data:   Vitals:   07/21/17 1457 07/21/17 2018 07/21/17 2146 07/22/17 0528  BP: 111/69  133/82 105/63  Pulse: 76  91 80  Resp: 18  20 20   Temp: 97.9 F (36.6 C)  98.6 F (37 C) 99.1 F (37.3 C)  TempSrc: Oral  Oral Oral  SpO2: 99% 96% 95% 97%  Weight:      Height:        Intake/Output Summary (Last 24 hours) at 07/22/17 0740 Last data filed at 07/22/17 0619  Gross per 24 hour  Intake          1806.67 ml  Output             1450 ml  Net           356.67 ml   Filed Weights   07/21/17 0236  Weight: 169 lb (76.7 kg)   Body mass index is 24.96 kg/m.  General:  Well nourished, well developed, in no acute distress HEENT: normal Neck: no JVD Vascular: No carotid bruits  Cardiac:  normal bounding S1, S2; RRR; no murmur  Lungs:  Respirations unlabored, wheezing throughout Abd: soft, nontender, no hepatomegaly  Ext: no edema Musculoskeletal:  No deformities, BUE and BLE strength normal and equal Skin: warm and dry  Neuro:  CNs 2-12 intact, no focal abnormalities noted Psych:  Normal affect   EKG:  The EKG was personally reviewed and demonstrates:  Afib RVR Telemetry:  Telemetry was personally reviewed and demonstrates:  NSR  Relevant CV Studies:  Echocardiogram 07/22/17: pending  Echocardiogram 05/15/12: Normal LVEF  Laboratory Data:  Chemistry Recent Labs Lab 07/21/17 0300 07/22/17 0459  NA 136 137  K 3.5 3.3*  CL 101 100*  CO2 27 29  GLUCOSE 143* 137*  BUN 20 9  CREATININE 0.91 0.78  CALCIUM 8.7* 8.3*  GFRNONAA >60 >60  GFRAA >60 >60  ANIONGAP 8 8     Recent Labs Lab 07/21/17 0300 07/22/17 0459  PROT 6.5 5.9*  ALBUMIN 3.4* 3.0*  AST 17 14*  ALT 15*  12*  ALKPHOS 73 71  BILITOT 0.2* 0.6   Hematology Recent Labs Lab 07/21/17 0300 07/22/17 0459  WBC 12.9* 16.4*  RBC 4.58 4.44  HGB 14.2 13.6  HCT 42.6 40.4  MCV 93.0 91.0  MCH 31.0 30.6  MCHC 33.3 33.7  RDW 13.3 13.5  PLT 234 201   Cardiac Enzymes Recent Labs Lab 07/21/17 1250  TROPONINI <0.03    Recent Labs Lab 07/21/17 0622  TROPIPOC 0.01    BNPNo results for input(s): BNP, PROBNP in the last 168 hours.  DDimer No results for input(s): DDIMER in the last 168 hours.  Radiology/Studies:  US Abdomen Limited Ruq  Result Date: 07/21/2017 CLINICAL DATA:  Generalized abdominal pain and nausea for 1 month. EXAM: ULTRASOUND ABDOMEN LIMITED RIGHT UPPER QUADRANT COMPARISON:  CT scan of July 11, 2017. FINDINGS: Gallbladder: Large gallstone measuring 2.9 cm is noted in the neck with moderate gallbladder wall thickening measured at 5 mm and pericholecystic fluid. However, no sonographic Murphy's sign is noted. Common bile duct: Diameter: 5.6 mm which is within normal limits. Liver: 1.3 cm right hepatic cyst is noted. Within normal limits in parenchymal echogenicity. Portal vein is patent on color Doppler imaging with normal direction of blood flow towards the liver. IMPRESSION: Large solitary gallstone is noted in the neck of the gallbladder. Moderate gallbladder wall thickening and pericholecystic fluid is noted concerning for cholecystitis. HIDA scan is recommended for further evaluation. Electronically Signed   By: Marijo Conception, M.D.   On: 07/21/2017 07:51    Assessment and Plan:   1. New onset Atrial fibrillation - pt has several comorbidities that may contribute to pathological dysfunction, including hx of alcoholism, COPD, and possible hypertensive heart disease (pt denies HTN, but is listed in EPIC) - he denies palpitations and  has never been told he has an arrhythmia - home meds include ASA and statin - recommend checking echocardiogram to assess structure and  function - K  3.3, replaced with PO by primary team - Mg pending, TSH pending - given his COPD, recommend starting diltiazem 120mg  daily for rate control as he is still having bouts of RVR this morning - this fib/flutter is likely related to his infectious process and may resolve as the infection resolves - This patients CHA2DS2-VASc Score and unadjusted Ischemic Stroke Rate (% per year) is equal to 4.8 % stroke rate/year from a score of 4 (age, HTN, PVD) - pt would benefit from anticoagulation - recommend starting heparin drip in the perioperative period  - when surgery deems safe to start anticoagulation, recommend 5 mg eliquis BID after surgery - pt is completely asymptomatic - it is unclear how long he has had paroxysmal Afib/flutter    2. Lung cancer, COPD, smoker - pt underwent radiation and is told his left lung cancer is "stable" - no home O2 - he quit smoking with the start of radiation   3. HTN - on arrival, sBP 140s; now 100-130s   4. Cholecystitis - per primary team - pending echo     For questions or updates, please contact [place] Please consult [email or web address] for contact info under Cardiology/STEMI.   Signed, Ledora Bottcher, [place]  07/22/2017 7:40 AM  Attending Note:   The patient was seen and examined.  Agree with assessment and plan as noted above.  Changes made to the above note as needed.  Patient seen and independently examined with Doreene Adas, PA .   We discussed all aspects of the encounter. I agree with the assessment and plan as stated above.  1.  Newly diagnosed paroxysmal atrial fibrillation. The patient has multiple risk factors for atrial fibrillation including history eval [place], COPD, lung cancer, and possible hypertensive heart disease. He has never been symptomatic.   He currently needs to have cholecystectomy. This point, we need to assess his left ventricular function by echo. If his LV function is relatively normal, then we can  safely clear him for surgery. If his LV function is abnormal, he still will likely be able to go to surgery but will need to make some considerations about fluids during his procedure.  I would recommend IV heparin until the time of surgery. Following surgery to need to be anticoagulated. I would recommend Xarelto or Eliquis.    I have spent a total of 40 minutes with patient reviewing hospital  notes , telemetry, EKGs, labs and examining patient as well as establishing an assessment and plan that was discussed with the patient. > 50% of time was spent in direct patient care.    Thayer Headings, Brooke Bonito., MD, [facility] 07/22/2017, 9:28 AM [address],  [place] Pager [phone]

## 2017-07-22 NOTE — Care Management Obs Status (Signed)
[place] NOTIFICATION   Patient Details  Name: Robert Bates MRN: [id number] Date of Birth: 13-Mar-1941   Medicare Observation Status Notification Given:  Yes    Apolonio Schneiders, RN 07/22/2017, 7:27 PM

## 2017-07-22 NOTE — Progress Notes (Signed)
PROGRESS NOTE    Robert Bates  BVQ:[id number] DOB: 1941/05/31 DOA: 07/21/2017 PCP: [name], NP     Brief Narrative:  Robert Bates is a 76 year old male with history of gallstones, alcoholism, tobacco abuse, lung cancer, COPD, GERD, AAA, HTN, PVD. He presents to the hospital with chief complaint of epigastric abdominal pain with radiation to his back, nausea, vomiting. CT scan 10 days ago showed gallstones without cholecystitis. However, ultrasound in the emergency department did reveal cholecystitis. EKG also showed new onset atrial fibrillation. Patient was admitted for further evaluation for cholecystitis as well as cardiac evaluation prior to surgery.   Assessment & Plan:   Active Problems:   Cholecystitis   Acute gallstone cholecystitis -Plan for cholecystectomy 9/19  New-onset atrial fibrillation -CHADSVASc 4 -Start heparin drip perioperatively, will need DOAC postoperatively when cleared by surgery -Started on Cardizem 120 mg CD -Echocardiogram showed grade 2 diastolic dysfunction  -Cardiology following  Hypokalemia -Replace   Hypomagnesemia -Replace   HTN -BP stable currently   HLD -Continue pravachol    DVT prophylaxis: heparin gtt Code Status: Full Family Communication: at bedside Disposition Plan: pending improvement, surgical procedure   Consultants:   Cardiology  CCS  Procedures:   None  Antimicrobials:  Anti-infectives    Start     Dose/Rate Route Frequency Ordered Stop   07/21/17 2200  piperacillin-tazobactam (ZOSYN) IVPB 3.375 g  Status:  Discontinued     3.375 g 12.5 mL/hr over 240 Minutes Intravenous Every 8 hours 07/21/17 1605 07/22/17 1330   07/21/17 0815  cefTRIAXone (ROCEPHIN) 2 g in dextrose 5 % 50 mL IVPB     2 g 100 mL/hr over 30 Minutes Intravenous  Once 07/21/17 0810 07/21/17 1034       Subjective: Patient feeling well currently. He is wondering when he can eat. He states that his abdominal pain is much  better. No nausea or vomiting. He denies any chest pain or heart palpitations.  Objective: Vitals:   07/21/17 2018 07/21/17 2146 07/22/17 0528 07/22/17 0940  BP:  133/82 105/63   Pulse:  91 80   Resp:  20 20   Temp:  98.6 F (37 C) 99.1 F (37.3 C)   TempSrc:  Oral Oral   SpO2: 96% 95% 97% 92%  Weight:      Height:        Intake/Output Summary (Last 24 hours) at 07/22/17 1333 Last data filed at 07/22/17 1219  Gross per 24 hour  Intake          1806.67 ml  Output             1550 ml  Net           256.67 ml   Filed Weights   07/21/17 0236  Weight: 76.7 kg (169 lb)    Examination:  General exam: Appears calm and comfortable  Respiratory system: Clear to auscultation. Respiratory effort normal. Cardiovascular system: S1 & S2 heard, RRR (has been intermittently in A fib RVR and NSR on telemetry). No JVD, murmurs, rubs, gallops or clicks. No pedal edema. Gastrointestinal system: Abdomen is nondistended, soft and tender to palpation right upper quadrant. No organomegaly or masses felt. Normal bowel sounds heard. Central nervous system: Alert and oriented. No focal neurological deficits. Extremities: Symmetric 5 x 5 power. Skin: No rashes, lesions or ulcers Psychiatry: Judgement and insight appear normal. Mood & affect appropriate.   Data Reviewed: I have personally reviewed following labs and imaging studies  CBC:  Recent Labs Lab  07/21/17 0300 07/22/17 0459  WBC 12.9* 16.4*  NEUTROABS 11.2*  --   HGB 14.2 13.6  HCT 42.6 40.4  MCV 93.0 91.0  PLT 234 229   Basic Metabolic Panel:  Recent Labs Lab 07/21/17 0300 07/22/17 0459  NA 136 137  K 3.5 3.3*  CL 101 100*  CO2 27 29  GLUCOSE 143* 137*  BUN 20 9  CREATININE 0.91 0.78  CALCIUM 8.7* 8.3*  MG  --  1.5*   GFR: Estimated Creatinine Clearance: 78.6 mL/min (by C-G formula based on SCr of 0.78 mg/dL). Liver Function Tests:  Recent Labs Lab 07/21/17 0300 07/22/17 0459  AST 17 14*  ALT 15* 12*    ALKPHOS 73 71  BILITOT 0.2* 0.6  PROT 6.5 5.9*  ALBUMIN 3.4* 3.0*    Recent Labs Lab 07/21/17 0300  LIPASE 23   No results for input(s): AMMONIA in the last 168 hours. Coagulation Profile: No results for input(s): INR, PROTIME in the last 168 hours. Cardiac Enzymes:  Recent Labs Lab 07/21/17 1250  TROPONINI <0.03   BNP (last 3 results) No results for input(s): PROBNP in the last 8760 hours. HbA1C: No results for input(s): HGBA1C in the last 72 hours. CBG: No results for input(s): GLUCAP in the last 168 hours. Lipid Profile: No results for input(s): CHOL, HDL, LDLCALC, TRIG, CHOLHDL, LDLDIRECT in the last 72 hours. Thyroid Function Tests:  Recent Labs  07/22/17 0843  TSH 1.023   Anemia Panel: No results for input(s): VITAMINB12, FOLATE, FERRITIN, TIBC, IRON, RETICCTPCT in the last 72 hours. Sepsis Labs: No results for input(s): PROCALCITON, LATICACIDVEN in the last 168 hours.  No results found for this or any previous visit (from the past 240 hour(s)).     Radiology Studies: US Abdomen Limited Ruq  Result Date: 07/21/2017 CLINICAL DATA:  Generalized abdominal pain and nausea for 1 month. EXAM: ULTRASOUND ABDOMEN LIMITED RIGHT UPPER QUADRANT COMPARISON:  CT scan of July 11, 2017. FINDINGS: Gallbladder: Large gallstone measuring 2.9 cm is noted in the neck with moderate gallbladder wall thickening measured at 5 mm and pericholecystic fluid. However, no sonographic Murphy's sign is noted. Common bile duct: Diameter: 5.6 mm which is within normal limits. Liver: 1.3 cm right hepatic cyst is noted. Within normal limits in parenchymal echogenicity. Portal vein is patent on color Doppler imaging with normal direction of blood flow towards the liver. IMPRESSION: Large solitary gallstone is noted in the neck of the gallbladder. Moderate gallbladder wall thickening and pericholecystic fluid is noted concerning for cholecystitis. HIDA scan is recommended for further  evaluation. Electronically Signed   By: Marijo Conception, M.D.   On: 07/21/2017 07:51      Scheduled Meds: . diltiazem  120 mg Oral Daily  . ipratropium-albuterol  3 mL Nebulization BID  . mometasone-formoterol  2 puff Inhalation BID  . pantoprazole (PROTONIX) IV  40 mg Intravenous Q24H  . pravastatin  80 mg Oral Daily   Continuous Infusions: . heparin 1,100 Units/hr (07/22/17 1216)     LOS: 1 day    Time spent: 40 minutes   Dessa Phi, DO [facility] [email or web address] Password [email or web address] 07/22/2017, 1:33 PM

## 2017-07-22 NOTE — Progress Notes (Signed)
  Echocardiogram 2D Echocardiogram has been performed.  Patient cramping on side during exam. Unable to lay in accurate position during length of exam.  Sharon Rubis L Androw 07/22/2017, 12:00 PM

## 2017-07-22 NOTE — Progress Notes (Signed)
    CC:  Abdominal pain  Subjective: Still has pain RUQ, better, he just got a shot.  No other complaints.  Objective: Vital signs in last 24 hours: Temp:  [97.7 F (36.5 C)-99.1 F (37.3 C)] 99.1 F (37.3 C) (09/18 0528) Pulse Rate:  [67-91] 80 (09/18 0528) Resp:  [16-20] 20 (09/18 0528) BP: (105-133)/(59-90) 105/63 (09/18 0528) SpO2:  [93 %-99 %] 97 % (09/18 0528) Last BM Date: 07/20/17 NPO 2300 IV 1450 urine TM 99.1 HR 43-151, last HR 80 On [place] with sats of 94-97%  - not on O2 at home K+ 3.3, mag 1.5 LFT's OK WBC is up  Intake/Output from previous day: 09/17 0701 - 09/18 0700 In: 2306.7 [I.V.:1706.7; IV Piggyback:600] Out: 1450 [Urine:1450] Intake/Output this shift: No intake/output data recorded.  General appearance: alert, cooperative and no distress Resp: clear to auscultation bilaterally GI: soft, tender RUq.    Lab Results:   Recent Labs  07/21/17 0300 07/22/17 0459  WBC 12.9* 16.4*  HGB 14.2 13.6  HCT 42.6 40.4  PLT 234 201    BMET  Recent Labs  07/21/17 0300 07/22/17 0459  NA 136 137  K 3.5 3.3*  CL 101 100*  CO2 27 29  GLUCOSE 143* 137*  BUN 20 9  CREATININE 0.91 0.78  CALCIUM 8.7* 8.3*   PT/INR No results for input(s): LABPROT, INR in the last 72 hours.   Recent Labs Lab 07/21/17 0300 07/22/17 0459  AST 17 14*  ALT 15* 12*  ALKPHOS 73 71  BILITOT 0.2* 0.6  PROT 6.5 5.9*  ALBUMIN 3.4* 3.0*     Lipase     Component Value Date/Time   LIPASE 23 07/21/2017 0300     Medications: . aspirin EC  81 mg Oral Daily  . heparin  5,000 Units Subcutaneous Q8H  . ipratropium-albuterol  3 mL Nebulization BID  . mometasone-formoterol  2 puff Inhalation BID  . pantoprazole (PROTONIX) IV  40 mg Intravenous Q24H  . pravastatin  80 mg Oral Daily   . dextrose 5 % and 0.45% NaCl 100 mL/hr at 07/22/17 0837  . piperacillin-tazobactam (ZOSYN)  IV 3.375 g (07/22/17 0552)   Anti-infectives    Start     Dose/Rate Route Frequency Ordered  Stop   07/21/17 2200  piperacillin-tazobactam (ZOSYN) IVPB 3.375 g     3.375 g 12.5 mL/hr over 240 Minutes Intravenous Every 8 hours 07/21/17 1605     07/21/17 0815  cefTRIAXone (ROCEPHIN) 2 g in dextrose 5 % 50 mL IVPB     2 g 100 mL/hr over 30 Minutes Intravenous  Once 07/21/17 0810 07/21/17 1034      Assessment/Plan Cholecystitis with cholelithiasis - gallstone neck of GB AF with RVR - New Hx of tobacco abuse - discontinued with Rad Rx for lung cancer Hx of ETOH abuse Lung cancer COPD PVD/AAA Hypertension FEN:  IV fluids/NPO ID:  Rocephin x 1 9/17; Zosyn 9/17 =>> day 2 DVT:  Heparin  Plan: Cardiology recommends an Echo, Diltiazem, anticoagulation, and think AF is related to cholecystitis. Echo, mag and K+ ordered.  I will discuss timing with Dr. Kieth Brightly.    LOS: 1 day    Robert Bates 07/22/2017 [phone]

## 2017-07-23 ENCOUNTER — Observation Stay (HOSPITAL_COMMUNITY): Payer: Medicare Other | Admitting: Anesthesiology

## 2017-07-23 ENCOUNTER — Encounter (HOSPITAL_COMMUNITY): Payer: Self-pay | Admitting: Anesthesiology

## 2017-07-23 ENCOUNTER — Encounter (HOSPITAL_COMMUNITY)
Admission: EM | Disposition: A | Discharge status: Home / Self Care | Payer: Self-pay | Source: Home / Self Care | Attending: Emergency Medicine

## 2017-07-23 DIAGNOSIS — K8000 Calculus of gallbladder with acute cholecystitis without obstruction: Secondary | ICD-10-CM | POA: Diagnosis not present

## 2017-07-23 DIAGNOSIS — I739 Peripheral vascular disease, unspecified: Secondary | ICD-10-CM | POA: Diagnosis not present

## 2017-07-23 DIAGNOSIS — R109 Unspecified abdominal pain: Secondary | ICD-10-CM

## 2017-07-23 DIAGNOSIS — Z87891 Personal history of nicotine dependence: Secondary | ICD-10-CM | POA: Diagnosis not present

## 2017-07-23 DIAGNOSIS — Z791 Long term (current) use of non-steroidal anti-inflammatories (NSAID): Secondary | ICD-10-CM | POA: Diagnosis not present

## 2017-07-23 DIAGNOSIS — I480 Paroxysmal atrial fibrillation: Secondary | ICD-10-CM | POA: Diagnosis not present

## 2017-07-23 DIAGNOSIS — K819 Cholecystitis, unspecified: Secondary | ICD-10-CM | POA: Diagnosis not present

## 2017-07-23 HISTORY — PX: CHOLECYSTECTOMY: SHX55

## 2017-07-23 LAB — COMPREHENSIVE METABOLIC PANEL
ALT: 13 U/L — ABNORMAL LOW (ref 17–63)
AST: 13 U/L — ABNORMAL LOW (ref 15–41)
Albumin: 2.7 g/dL — ABNORMAL LOW (ref 3.5–5.0)
Alkaline Phosphatase: 69 U/L (ref 38–126)
Anion gap: 7 (ref 5–15)
BUN: 8 mg/dL (ref 6–20)
CO2: 28 mmol/L (ref 22–32)
Calcium: 8.2 mg/dL — ABNORMAL LOW (ref 8.9–10.3)
Chloride: 102 mmol/L (ref 101–111)
Creatinine, Ser: 0.68 mg/dL (ref 0.61–1.24)
GFR calc Af Amer: >60 mL/min (ref >60)
GFR calc non Af Amer: >60 mL/min (ref >60)
Glucose, Bld: 115 mg/dL — ABNORMAL HIGH (ref 65–99)
Potassium: 3.8 mmol/L (ref 3.5–5.1)
Sodium: 137 mmol/L (ref 135–145)
Total Bilirubin: 0.6 mg/dL (ref 0.3–1.2)
Total Protein: 5.7 g/dL — ABNORMAL LOW (ref 6.5–8.1)

## 2017-07-23 LAB — CK TOTAL AND CKMB (NOT AT ARMC)
CK, MB: 4.9 ng/mL (ref 0.5–5.0)
Relative Index: INVALID (ref 0.0–2.5)
Total CK: 30 U/L — ABNORMAL LOW (ref 49–397)

## 2017-07-23 LAB — CBC
HCT: 40.9 % (ref 39.0–52.0)
Hemoglobin: 13.4 g/dL (ref 13.0–17.0)
MCH: 30.6 pg (ref 26.0–34.0)
MCHC: 32.8 g/dL (ref 30.0–36.0)
MCV: 93.4 fL (ref 78.0–100.0)
Platelets: 188 10*3/uL (ref 150–400)
RBC: 4.38 MIL/uL (ref 4.22–5.81)
RDW: 13.8 % (ref 11.5–15.5)
WBC: 14.9 10*3/uL — ABNORMAL HIGH (ref 4.0–10.5)

## 2017-07-23 LAB — SURGICAL PCR SCREEN
MRSA, PCR: NEGATIVE
Staphylococcus aureus: NEGATIVE

## 2017-07-23 LAB — MAGNESIUM
Magnesium: 1.9 mg/dL (ref 1.7–2.4)
Magnesium: 1.8 mg/dL (ref 1.7–2.4)

## 2017-07-23 LAB — TROPONIN I: Troponin I: <0.03 ng/mL (ref <0.03)

## 2017-07-23 LAB — HEPARIN LEVEL (UNFRACTIONATED): Heparin Unfractionated: 0.32 IU/mL (ref 0.30–0.70)

## 2017-07-23 SURGERY — LAPAROSCOPIC CHOLECYSTECTOMY
Anesthesia: General

## 2017-07-23 MED ORDER — FENTANYL CITRATE (PF) 100 MCG/2ML IJ SOLN
INTRAMUSCULAR | Status: AC
  Filled 2017-07-23: qty 2

## 2017-07-23 MED ORDER — PROPOFOL 10 MG/ML IV BOLUS
INTRAVENOUS | Status: DC | PRN
Start: 2017-07-23 — End: 2017-07-23
  Administered 2017-07-23: 150 mg via INTRAVENOUS

## 2017-07-23 MED ORDER — LACTATED RINGERS IV SOLN
INTRAVENOUS | Status: DC
Start: 2017-07-23 — End: 2017-07-23
  Administered 2017-07-23 (×2): via INTRAVENOUS

## 2017-07-23 MED ORDER — LACTATED RINGERS IR SOLN
Status: DC | PRN
  Administered 2017-07-23: 2000 mL

## 2017-07-23 MED ORDER — BUPIVACAINE LIPOSOME 1.3 % IJ SUSP
20.0000 mL | Freq: Once | INTRAMUSCULAR | Status: DC
  Filled 2017-07-23: qty 20

## 2017-07-23 MED ORDER — PHENYLEPHRINE 40 MCG/ML (10ML) SYRINGE FOR IV PUSH (FOR BLOOD PRESSURE SUPPORT)
PREFILLED_SYRINGE | INTRAVENOUS | Status: AC
  Filled 2017-07-23: qty 10

## 2017-07-23 MED ORDER — ROCURONIUM BROMIDE 50 MG/5ML IV SOSY
PREFILLED_SYRINGE | INTRAVENOUS | Status: AC
Start: 2017-07-23 — End: ?
  Filled 2017-07-23: qty 5

## 2017-07-23 MED ORDER — FENTANYL CITRATE (PF) 100 MCG/2ML IJ SOLN
INTRAMUSCULAR | Status: DC | PRN
  Administered 2017-07-23 (×2): 50 ug via INTRAVENOUS

## 2017-07-23 MED ORDER — LIDOCAINE 2% (20 MG/ML) 5 ML SYRINGE
INTRAMUSCULAR | Status: AC
  Filled 2017-07-23: qty 5

## 2017-07-23 MED ORDER — HYDROCODONE-ACETAMINOPHEN 5-325 MG PO TABS
1.0000 | ORAL_TABLET | ORAL | Status: DC | PRN
  Administered 2017-07-23 – 2017-07-24 (×2): 1 via ORAL
  Filled 2017-07-23 (×2): qty 1

## 2017-07-23 MED ORDER — DEXTROSE-NACL 5-0.45 % IV SOLN
INTRAVENOUS | Status: DC
  Administered 2017-07-23 – 2017-07-24 (×3): via INTRAVENOUS

## 2017-07-23 MED ORDER — PHENYLEPHRINE 40 MCG/ML (10ML) SYRINGE FOR IV PUSH (FOR BLOOD PRESSURE SUPPORT)
PREFILLED_SYRINGE | INTRAVENOUS | Status: DC | PRN
  Administered 2017-07-23 (×2): 80 ug via INTRAVENOUS

## 2017-07-23 MED ORDER — GLYCOPYRROLATE 0.2 MG/ML IV SOSY
PREFILLED_SYRINGE | INTRAVENOUS | Status: AC
  Filled 2017-07-23: qty 5

## 2017-07-23 MED ORDER — ROCURONIUM BROMIDE 50 MG/5ML IV SOSY
PREFILLED_SYRINGE | INTRAVENOUS | Status: AC
  Filled 2017-07-23: qty 5

## 2017-07-23 MED ORDER — BUPIVACAINE-EPINEPHRINE (PF) 0.25% -1:200000 IJ SOLN
INTRAMUSCULAR | Status: AC
  Filled 2017-07-23: qty 30

## 2017-07-23 MED ORDER — DEXAMETHASONE SODIUM PHOSPHATE 10 MG/ML IJ SOLN
INTRAMUSCULAR | Status: DC | PRN
  Administered 2017-07-23: 10 mg via INTRAVENOUS

## 2017-07-23 MED ORDER — SUCCINYLCHOLINE CHLORIDE 200 MG/10ML IV SOSY
PREFILLED_SYRINGE | INTRAVENOUS | Status: AC
  Filled 2017-07-23: qty 10

## 2017-07-23 MED ORDER — LIDOCAINE 2% (20 MG/ML) 5 ML SYRINGE
INTRAMUSCULAR | Status: DC | PRN
  Administered 2017-07-23: 100 mg via INTRAVENOUS

## 2017-07-23 MED ORDER — DILTIAZEM HCL 100 MG IV SOLR
5.0000 mg | Freq: Once | INTRAVENOUS | Status: DC
  Administered 2017-07-23 (×2): 5 mg via INTRAVENOUS

## 2017-07-23 MED ORDER — DILTIAZEM HCL 25 MG/5ML IV SOLN
10.0000 mg | INTRAVENOUS | Status: DC | PRN
Start: 2017-07-23 — End: 2017-07-24
  Filled 2017-07-23: qty 5

## 2017-07-23 MED ORDER — MEPERIDINE HCL 50 MG/ML IJ SOLN: 6.2500 mg | INTRAMUSCULAR | Status: DC | PRN

## 2017-07-23 MED ORDER — HYDROMORPHONE HCL-NACL 0.5-0.9 MG/ML-% IV SOSY
PREFILLED_SYRINGE | INTRAVENOUS | Status: AC
  Filled 2017-07-23: qty 1

## 2017-07-23 MED ORDER — DEXTROSE 5 % IV SOLN
5.0000 mg/h | INTRAVENOUS | Status: DC
  Filled 2017-07-23: qty 100

## 2017-07-23 MED ORDER — SUGAMMADEX SODIUM 200 MG/2ML IV SOLN
INTRAVENOUS | Status: DC | PRN
  Administered 2017-07-23: 200 mg via INTRAVENOUS

## 2017-07-23 MED ORDER — DEXAMETHASONE SODIUM PHOSPHATE 10 MG/ML IJ SOLN
INTRAMUSCULAR | Status: AC
Start: 2017-07-23 — End: ?
  Filled 2017-07-23: qty 1

## 2017-07-23 MED ORDER — SUCCINYLCHOLINE CHLORIDE 200 MG/10ML IV SOSY
PREFILLED_SYRINGE | INTRAVENOUS | Status: DC | PRN
  Administered 2017-07-23: 120 mg via INTRAVENOUS

## 2017-07-23 MED ORDER — METOCLOPRAMIDE HCL 5 MG/ML IJ SOLN: 10.0000 mg | Freq: Once | INTRAMUSCULAR | Status: DC | PRN

## 2017-07-23 MED ORDER — BUPIVACAINE-EPINEPHRINE 0.25% -1:200000 IJ SOLN
INTRAMUSCULAR | Status: DC | PRN
  Administered 2017-07-23: 30 mL

## 2017-07-23 MED ORDER — ROCURONIUM BROMIDE 10 MG/ML (PF) SYRINGE
PREFILLED_SYRINGE | INTRAVENOUS | Status: DC | PRN
  Administered 2017-07-23: 40 mg via INTRAVENOUS

## 2017-07-23 MED ORDER — PIPERACILLIN-TAZOBACTAM 3.375 G IVPB
3.3750 g | Freq: Three times a day (TID) | INTRAVENOUS | Status: DC
  Administered 2017-07-23: 3.375 g via INTRAVENOUS
  Filled 2017-07-23: qty 50

## 2017-07-23 MED ORDER — LEVALBUTEROL HCL 0.63 MG/3ML IN NEBU
0.6300 mg | INHALATION_SOLUTION | Freq: Three times a day (TID) | RESPIRATORY_TRACT | Status: DC
  Administered 2017-07-23: 0.63 mg via RESPIRATORY_TRACT

## 2017-07-23 MED ORDER — ESMOLOL HCL 100 MG/10ML IV SOLN
INTRAVENOUS | Status: DC | PRN
  Administered 2017-07-23: 60 mg via INTRAVENOUS
  Administered 2017-07-23: 20 mg via INTRAVENOUS

## 2017-07-23 MED ORDER — LEVALBUTEROL HCL 0.63 MG/3ML IN NEBU
INHALATION_SOLUTION | RESPIRATORY_TRACT | Status: AC
  Filled 2017-07-23: qty 3

## 2017-07-23 MED ORDER — DILTIAZEM LOAD VIA INFUSION
10.0000 mg | Freq: Once | INTRAVENOUS | Status: AC
  Administered 2017-07-23: 10 mg via INTRAVENOUS
  Filled 2017-07-23: qty 10

## 2017-07-23 MED ORDER — HYDROMORPHONE HCL-NACL 0.5-0.9 MG/ML-% IV SOSY
0.2500 mg | PREFILLED_SYRINGE | INTRAVENOUS | Status: DC | PRN
  Administered 2017-07-23: 0.5 mg via INTRAVENOUS
  Administered 2017-07-23: 0.25 mg via INTRAVENOUS
  Administered 2017-07-23: 0.5 mg via INTRAVENOUS

## 2017-07-23 MED ORDER — PROPOFOL 10 MG/ML IV BOLUS
INTRAVENOUS | Status: AC
Start: 2017-07-23 — End: ?
  Filled 2017-07-23: qty 20

## 2017-07-23 MED ORDER — ONDANSETRON HCL 4 MG/2ML IJ SOLN
INTRAMUSCULAR | Status: AC
  Filled 2017-07-23: qty 2

## 2017-07-23 MED ORDER — HYDROMORPHONE HCL-NACL 0.5-0.9 MG/ML-% IV SOSY
PREFILLED_SYRINGE | INTRAVENOUS | Status: AC
  Filled 2017-07-23: qty 2

## 2017-07-23 SURGICAL SUPPLY — 44 items
APPLIER CLIP ROT 10 11.4 M/L (STAPLE)
BANDAGE ADH SHEER 1  50/CT (GAUZE/BANDAGES/DRESSINGS) ×15 IMPLANT
BENZOIN TINCTURE PRP APPL 2/3 (GAUZE/BANDAGES/DRESSINGS) IMPLANT
CABLE HIGH FREQUENCY MONO STRZ (ELECTRODE) ×3 IMPLANT
CATH CHOLANG 76X19 KUMAR (CATHETERS) IMPLANT
CHLORAPREP W/TINT 26ML (MISCELLANEOUS) ×3 IMPLANT
CLIP APPLIE ROT 10 11.4 M/L (STAPLE) IMPLANT
CLIP VESOLOCK LG 6/CT PURPLE (CLIP) IMPLANT
CLIP VESOLOCK XL 6/CT (CLIP) IMPLANT
CLOSURE WOUND 1/2 X4 (GAUZE/BANDAGES/DRESSINGS)
COVER MAYO STAND STRL (DRAPES) IMPLANT
COVER SURGICAL LIGHT HANDLE (MISCELLANEOUS) ×3 IMPLANT
DECANTER SPIKE VIAL GLASS SM (MISCELLANEOUS) IMPLANT
DERMABOND ADVANCED (GAUZE/BANDAGES/DRESSINGS) ×2
DERMABOND ADVANCED .7 DNX12 (GAUZE/BANDAGES/DRESSINGS) ×1 IMPLANT
DRAIN CHANNEL 19F RND (DRAIN) IMPLANT
DRAPE C-ARM 42X120 X-RAY (DRAPES) IMPLANT
EVACUATOR SILICONE 100CC (DRAIN) IMPLANT
GLOVE BIOGEL PI IND STRL 7.0 (GLOVE) ×1 IMPLANT
GLOVE BIOGEL PI INDICATOR 7.0 (GLOVE) ×2
GLOVE SURG SS PI 7.0 STRL IVOR (GLOVE) ×3 IMPLANT
GOWN STRL REUS W/TWL LRG LVL3 (GOWN DISPOSABLE) ×3 IMPLANT
GOWN STRL REUS W/TWL XL LVL3 (GOWN DISPOSABLE) ×6 IMPLANT
GRASPER SUT TROCAR 14GX15 (MISCELLANEOUS) ×3 IMPLANT
IRRIG SUCT STRYKERFLOW 2 WTIP (MISCELLANEOUS) ×3
IRRIGATION SUCT STRKRFLW 2 WTP (MISCELLANEOUS) ×1 IMPLANT
KIT BASIN OR (CUSTOM PROCEDURE TRAY) ×3 IMPLANT
POUCH RETRIEVAL ECOSAC 10 (ENDOMECHANICALS) ×1 IMPLANT
POUCH RETRIEVAL ECOSAC 10MM (ENDOMECHANICALS) ×2
SCISSORS LAP 5X35 DISP (ENDOMECHANICALS) IMPLANT
SHEARS HARMONIC ACE PLUS 36CM (ENDOMECHANICALS) IMPLANT
SLEEVE XCEL OPT CAN 5 100 (ENDOMECHANICALS) ×6 IMPLANT
STOPCOCK 4 WAY LG BORE MALE ST (IV SETS) IMPLANT
STRIP CLOSURE SKIN 1/2X4 (GAUZE/BANDAGES/DRESSINGS) IMPLANT
SUT ETHILON 2 0 PS N (SUTURE) IMPLANT
SUT MNCRL AB 4-0 PS2 18 (SUTURE) ×3 IMPLANT
SUT VICRYL 0 ENDOLOOP (SUTURE) IMPLANT
SUT VICRYL 0 UR6 27IN ABS (SUTURE) ×3 IMPLANT
TOWEL OR 17X26 10 PK STRL BLUE (TOWEL DISPOSABLE) ×3 IMPLANT
TOWEL OR NON WOVEN STRL DISP B (DISPOSABLE) ×3 IMPLANT
TRAY LAPAROSCOPIC (CUSTOM PROCEDURE TRAY) ×3 IMPLANT
TROCAR BLADELESS OPT 5 100 (ENDOMECHANICALS) ×3 IMPLANT
TROCAR XCEL 12X100 BLDLESS (ENDOMECHANICALS) ×3 IMPLANT
TUBING INSUF HEATED (TUBING) ×3 IMPLANT

## 2017-07-23 NOTE — Transfer of Care (Signed)
Immediate Anesthesia Transfer of Care Note  Patient: Robert Bates  Procedure(s) Performed: Procedure(s): LAPAROSCOPIC CHOLECYSTECTOMY (N/A)  Patient Location: PACU  Anesthesia Type:General  Level of Consciousness: sedated  Airway & Oxygen Therapy: Patient Spontanous Breathing and Patient connected to face mask oxygen  Post-op Assessment: Report given to RN and Post -op Vital signs reviewed and stable  Post vital signs: Reviewed and stable  Last Vitals:  Vitals:   07/23/17 1100 07/23/17 1145  BP: 115/70 (!) 106/53  Pulse: 74 71  Resp: (!) 23 (!) 25  Temp:    SpO2: 93% 97%    Last Pain:  Vitals:   07/23/17 0844  TempSrc:   PainSc: 0-No pain      Patients Stated Pain Goal: 3 (39/58/44 1712)  Complications: No apparent anesthesia complications

## 2017-07-23 NOTE — Progress Notes (Signed)
PROGRESS NOTE    Robert Bates  WPY:[id number] DOB: 1941/03/09 DOA: 07/21/2017 PCP: [name], NP    Brief Narrative:  Robert Bates is a 76 year old male with history of gallstones, alcoholism, tobacco abuse, lung cancer, COPD, GERD, AAA, HTN, PVD. He presents to the hospital with chief complaint of epigastric abdominal pain with radiation to his back, nausea, vomiting. CT scan 10 days ago showed gallstones without cholecystitis. However, ultrasound in the emergency department did reveal cholecystitis. EKG also showed new onset atrial fibrillation. Patient was admitted for further evaluation for cholecystitis as well as cardiac evaluation prior to surgery.    Assessment & Plan:   Active Problems:   Cholecystitis   Acute gallstone cholecystitis -s/p cholecystectomy 9/19 - continue zosyn per surgery  New-onset atrial fibrillation  Afib with RVR postop improved with esmolol and valsalva, currently sinus rhythm - currently on dilt gtt, will d/c this (he's in NSR and no longer tachy and has already received dilt 120 mg today) -CHADSVASc 4 -Start heparin drip perioperatively, will need DOAC postoperatively when cleared by surgery -Started on Cardizem 120 mg CD -Echocardiogram showed grade 2 diastolic dysfunction  -Cardiology following  Hypokalemia -Replace   Hypomagnesemia -Replace   HTN -BP stable currently   HLD -Continue pravachol    DVT prophylaxis: SCD Code Status: full  Family Communication: wife in room Disposition Plan: pending surgery   Consultants:   Cardiology  Surgery  Procedures: (Don't include imaging studies which can be auto populated. Include things that cannot be auto populated i.e. Echo, Carotid and venous dopplers, Foley, Bipap, HD, tubes/drains, wound vac, central lines etc)  Echo  Cholecystectomy  Antimicrobials: (specify start and planned stop date. Auto populated tables are space occupying and do not give end  dates)  zosyn    Subjective: Feeling ok post op.  "I feel with my hands".  No CP, SOB.    Objective: Vitals:   07/23/17 1545 07/23/17 1600 07/23/17 1615 07/23/17 1620  BP: 105/64 103/64 112/71 134/82  Pulse: 73 75 76 71  Resp: 15 12 14 16   Temp:    98.2 F (36.8 C)  TempSrc:      SpO2: 93% 93% 94% 96%  Weight:      Height:        Intake/Output Summary (Last 24 hours) at 07/23/17 1642 Last data filed at 07/23/17 1411  Gross per 24 hour  Intake          1664.27 ml  Output              600 ml  Net          1064.27 ml   Filed Weights   07/21/17 0236  Weight: 76.7 kg (169 lb)    Examination:  General exam: Appears calm and comfortable  Respiratory system: Clear to auscultation. Respiratory effort normal. Cardiovascular system: S1 & S2 heard, RRR. No JVD, murmurs, rubs, gallops or clicks. No pedal edema. Gastrointestinal system: Abdomen is mildly distended, soft and nontender. No organomegaly or masses felt. Normal bowel sounds heard.  laparascopic incisions present Central nervous system: Alert and oriented. No focal neurological deficits. Extremities: Symmetric 5 x 5 power. Skin: No rashes, lesions or ulcers Psychiatry: Judgement and insight appear normal. Mood & affect appropriate.     Data Reviewed: I have personally reviewed following labs and imaging studies  CBC:  Recent Labs Lab 07/21/17 0300 07/22/17 0459 07/23/17 0408  WBC 12.9* 16.4* 14.9*  NEUTROABS 11.2*  --   --   HGB  14.2 13.6 13.4  HCT 42.6 40.4 40.9  MCV 93.0 91.0 93.4  PLT 234 201 283   Basic Metabolic Panel:  Recent Labs Lab 07/21/17 0300 07/22/17 0459 07/23/17 0408 07/23/17 1436  NA 136 137 137  --   K 3.5 3.3* 3.8  --   CL 101 100* 102  --   CO2 27 29 28   --   GLUCOSE 143* 137* 115*  --   BUN 20 9 8   --   CREATININE 0.91 0.78 0.68  --   CALCIUM 8.7* 8.3* 8.2*  --   MG  --  1.5* 1.9 1.8   GFR: Estimated Creatinine Clearance: 78.6 mL/min (by C-G formula based on SCr of  0.68 mg/dL). Liver Function Tests:  Recent Labs Lab 07/21/17 0300 07/22/17 0459 07/23/17 0408  AST 17 14* 13*  ALT 15* 12* 13*  ALKPHOS 73 71 69  BILITOT 0.2* 0.6 0.6  PROT 6.5 5.9* 5.7*  ALBUMIN 3.4* 3.0* 2.7*    Recent Labs Lab 07/21/17 0300  LIPASE 23   No results for input(s): AMMONIA in the last 168 hours. Coagulation Profile: No results for input(s): INR, PROTIME in the last 168 hours. Cardiac Enzymes:  Recent Labs Lab 07/21/17 1250 07/23/17 1436  TROPONINI <0.03 <0.03   BNP (last 3 results) No results for input(s): PROBNP in the last 8760 hours. HbA1C: No results for input(s): HGBA1C in the last 72 hours. CBG: No results for input(s): GLUCAP in the last 168 hours. Lipid Profile: No results for input(s): CHOL, HDL, LDLCALC, TRIG, CHOLHDL, LDLDIRECT in the last 72 hours. Thyroid Function Tests:  Recent Labs  07/22/17 0843  TSH 1.023   Anemia Panel: No results for input(s): VITAMINB12, FOLATE, FERRITIN, TIBC, IRON, RETICCTPCT in the last 72 hours. Sepsis Labs: No results for input(s): PROCALCITON, LATICACIDVEN in the last 168 hours.  Recent Results (from the past 240 hour(s))  Surgical pcr screen     Status: None   Collection Time: 07/23/17  4:37 AM  Result Value Ref Range Status   MRSA, PCR NEGATIVE NEGATIVE Final   Staphylococcus aureus NEGATIVE NEGATIVE Final    Comment: (NOTE) The Xpert SA Assay (FDA approved for NASAL specimens in patients 61 years of age and older), is one component of a comprehensive surveillance program. It is not intended to diagnose infection nor to guide or monitor treatment.          Radiology Studies: No results found.      Scheduled Meds: . diltiazem  120 mg Oral Daily  . ipratropium-albuterol  3 mL Nebulization BID  . mometasone-formoterol  2 puff Inhalation BID  . pantoprazole (PROTONIX) IV  40 mg Intravenous Q24H  . pravastatin  80 mg Oral Daily   Continuous Infusions: . dextrose 5 % and 0.45%  NaCl 100 mL/hr at 07/23/17 0900  . diltiazem (CARDIZEM) infusion 15 mg/hr (07/23/17 1531)  . piperacillin-tazobactam (ZOSYN)  IV Stopped (07/23/17 1241)     LOS: 1 day    Time spent: 30 minutes    Fayrene Helper, MD [facility] [phone]  If 7PM-7AM, please contact night-coverage [email or web address] Password [facility] 07/23/2017, 4:42 PM

## 2017-07-23 NOTE — Progress Notes (Signed)
Progress Note  Patient Name: Robert Bates Date of Encounter: 07/23/2017  Primary Cardiologist: New , Seher Schlagel  Subjective   Robert Bates is a 76 y.o. male with a hx of tobacco abuse, alcoholism, lung cancer, COPD, PVD, HLD, GERD, AAA, HTN,  who is being seen today for the evaluation of Afib at the request of Dr. Maylene Roes.  He was found have atrial fibrillation. We saw him as part of a preoperative evaluation. Echocardiogram revealed normal left ventricular systolic function.  He status post cholecystectomy. He's currently on IV diltiazem. His heart rate is well-controlled.  Inpatient Medications    Scheduled Meds: . diltiazem  120 mg Oral Daily  . ipratropium-albuterol  3 mL Nebulization BID  . mometasone-formoterol  2 puff Inhalation BID  . pantoprazole (PROTONIX) IV  40 mg Intravenous Q24H  . pravastatin  80 mg Oral Daily   Continuous Infusions: . dextrose 5 % and 0.45% NaCl 100 mL/hr at 07/23/17 0900  . piperacillin-tazobactam (ZOSYN)  IV Stopped (07/23/17 1241)   PRN Meds: albuterol, morphine injection, ondansetron (ZOFRAN) IV   Vital Signs    Vitals:   07/23/17 1545 07/23/17 1600 07/23/17 1615 07/23/17 1620  BP: 105/64 103/64 112/71 134/82  Pulse: 73 75 76 71  Resp: 15 12 14 16   Temp:    98.2 F (36.8 C)  TempSrc:      SpO2: 93% 93% 94% 96%  Weight:    (P) 167 lb 15.9 oz (76.2 kg)  Height:    (P) 5\' 9"  (1.753 m)    Intake/Output Summary (Last 24 hours) at 07/23/17 1707 Last data filed at 07/23/17 1411  Gross per 24 hour  Intake          1664.27 ml  Output              500 ml  Net          1164.27 ml   Filed Weights   07/21/17 0236 07/23/17 1620  Weight: 169 lb (76.7 kg) (P) 167 lb 15.9 oz (76.2 kg)    Telemetry    Atrial fib  - Personally Reviewed  ECG     atrial fib  - Personally Reviewed  Physical Exam   GEN: Elderly gentleman, mildly uncomfortable. Not in any distress. Neck: No JVD Cardiac:  irregularly irregular. Respiratory: Clear to  auscultation bilaterally. GI:  no bowel sounds. Not particularly tender. Not distended. MS: No edema; No deformity. Neuro:  Nonfocal  Psych: Normal affect   Labs    Chemistry Recent Labs Lab 07/21/17 0300 07/22/17 0459 07/23/17 0408  NA 136 137 137  K 3.5 3.3* 3.8  CL 101 100* 102  CO2 27 29 28   GLUCOSE 143* 137* 115*  BUN 20 9 8   CREATININE 0.91 0.78 0.68  CALCIUM 8.7* 8.3* 8.2*  PROT 6.5 5.9* 5.7*  ALBUMIN 3.4* 3.0* 2.7*  AST 17 14* 13*  ALT 15* 12* 13*  ALKPHOS 73 71 69  BILITOT 0.2* 0.6 0.6  GFRNONAA >60 >60 >60  GFRAA >60 >60 >60  ANIONGAP 8 8 7      Hematology Recent Labs Lab 07/21/17 0300 07/22/17 0459 07/23/17 0408  WBC 12.9* 16.4* 14.9*  RBC 4.58 4.44 4.38  HGB 14.2 13.6 13.4  HCT 42.6 40.4 40.9  MCV 93.0 91.0 93.4  MCH 31.0 30.6 30.6  MCHC 33.3 33.7 32.8  RDW 13.3 13.5 13.8  PLT 234 201 188    Cardiac Enzymes Recent Labs Lab 07/21/17 1250 07/23/17 1436  TROPONINI <0.03 <0.03  Recent Labs Lab 07/21/17 0622  TROPIPOC 0.01     BNPNo results for input(s): BNP, PROBNP in the last 168 hours.   DDimer No results for input(s): DDIMER in the last 168 hours.   Radiology    No results found.  Cardiac Studies     Patient Profile     76 y.o. male with AF. Now s/p cholecystectomy   Assessment & Plan    1. Atrial fibrillation: The patient is currently rate controlled on IV diltiazem. Continue IV diltiazem until he's taking medications orally. We'll transition to Cardizem cd 240 mg a day and titrate upward as needed.  He will need to be started on anticoagulation. I would suggest Eliquis 5 mg BID.   We'll defer to the surgery team as to the timing of initiation of the anticoagulation.  I'm hopeful that we can start anticoagulation either tomorrow or perhaps the next day.  For questions or updates, please contact [place] Please consult [email or web address] for contact info under Cardiology/STEMI.      Signed, Mertie Moores, MD    07/23/2017, 5:07 PM

## 2017-07-23 NOTE — Anesthesia Postprocedure Evaluation (Signed)
Anesthesia Post Note  Patient: Robert Bates  Procedure(s) Performed: Procedure(s) (LRB): LAPAROSCOPIC CHOLECYSTECTOMY (N/A)     Patient location during evaluation: PACU Anesthesia Type: General Level of consciousness: awake and alert and oriented Pain management: pain level controlled Vital Signs Assessment: post-procedure vital signs reviewed and stable Respiratory status: spontaneous breathing, nonlabored ventilation, respiratory function stable and patient connected to nasal cannula oxygen Cardiovascular status: blood pressure returned to baseline, stable and unstable Postop Assessment: no apparent nausea or vomiting Anesthetic complications: no Comments: Patient developed Atrial Fibrillation with RVR at end of procedure, converted to sinus bradycardia with rate in 50's with valsalva with ETT plus IV esmolol. Went back into A. Fib with RVR in PACU. Diltiazem bolus and continuous infusion started and converted to NSR. Labs drawn. Cardiology to be consulted. Given xopenex nebulizer Rx for complaint of SOB. Patient transferred to stepdown unit for further care and monitoring with IV diltiazem infusing.    Last Vitals:  Vitals:   07/23/17 1145 07/23/17 1418  BP: (!) 106/53 119/73  Pulse: 71 71  Resp: (!) 25 16  Temp:  37 C  SpO2: 97% 92%    Last Pain:  Vitals:   07/23/17 0844  TempSrc:   PainSc: 0-No pain                 Shalimar Mcclain A.

## 2017-07-23 NOTE — H&P (Signed)
Pre Procedure note for inpatients:   Robert Bates has been scheduled for Procedure(s): LAPAROSCOPIC CHOLECYSTECTOMY (N/A) today. The various methods of treatment have been discussed with the patient. After consideration of the risks, benefits and treatment options the patient has consented to the planned procedure.   The patient has been seen and labs reviewed. There are no changes in the patient's condition to prevent proceeding with the planned procedure today.  Recent labs:  Lab Results  Component Value Date   WBC 14.9 (H) 07/23/2017   HGB 13.4 07/23/2017   HCT 40.9 07/23/2017   PLT 188 07/23/2017   GLUCOSE 115 (H) 07/23/2017   CHOL 146 03/14/2017   TRIG 162.0 (H) 03/14/2017   HDL 49.80 03/14/2017   LDLDIRECT 142.2 12/26/2008   LDLCALC 64 03/14/2017   ALT 13 (L) 07/23/2017   AST 13 (L) 07/23/2017   NA 137 07/23/2017   K 3.8 07/23/2017   CL 102 07/23/2017   CREATININE 0.68 07/23/2017   BUN 8 07/23/2017   CO2 28 07/23/2017   TSH 1.023 07/22/2017   PSA 1.88 11/17/2009   INR 1.00 08/23/2015    Mickeal Skinner, MD 07/23/2017 12:23 PM

## 2017-07-23 NOTE — Anesthesia Preprocedure Evaluation (Addendum)
Anesthesia Evaluation  Patient identified by MRN, date of birth, ID band Patient awake    Reviewed: Allergy & Precautions, NPO status , Patient's Chart, lab work & pertinent test results  Airway Mallampati: II  TM Distance: >3 FB Neck ROM: Full    Dental  (+) Edentulous Upper, Edentulous Lower   Pulmonary pneumonia, resolved, COPD,  COPD inhaler, former smoker,    Pulmonary exam normal breath sounds clear to auscultation       Cardiovascular hypertension, Pt. on medications + Peripheral Vascular Disease  Normal cardiovascular exam Rhythm:Regular Rate:Normal  AAA   Neuro/Psych PSYCHIATRIC DISORDERS Anxiety  Neuromuscular disease    GI/Hepatic hiatal hernia, GERD  Medicated and Controlled,(+)     substance abuse  alcohol use, Cholecystitis- Chronic   Endo/Other  negative endocrine ROS  Renal/GU Renal InsufficiencyRenal disease  negative genitourinary   Musculoskeletal  (+) Arthritis , Osteoarthritis,  Right Sciatica   Abdominal (+)  Abdomen: tender.    Peds  Hematology negative hematology ROS (+)   Anesthesia Other Findings   Reproductive/Obstetrics                           Anesthesia Physical Anesthesia Plan  ASA: III  Anesthesia Plan: General   Post-op Pain Management:    Induction: Intravenous  PONV Risk Score and Plan: 4 or greater and Ondansetron, Dexamethasone, Midazolam, Propofol infusion, Promethazine and Treatment may vary due to age or medical condition  Airway Management Planned: Oral ETT  Additional Equipment:   Intra-op Plan:   Post-operative Plan: Extubation in OR  Informed Consent: I have reviewed the patients History and Physical, chart, labs and discussed the procedure including the risks, benefits and alternatives for the proposed anesthesia with the patient or authorized representative who has indicated his/her understanding and acceptance.   Dental  advisory given  Plan Discussed with: CRNA, Anesthesiologist and Surgeon  Anesthesia Plan Comments:         Anesthesia Quick Evaluation

## 2017-07-23 NOTE — Anesthesia Procedure Notes (Signed)
Procedure Name: Intubation Date/Time: 07/23/2017 12:48 PM Performed by: Lind Covert Pre-anesthesia Checklist: Patient identified, Emergency Drugs available, Suction available, Patient being monitored and Timeout performed Patient Re-evaluated:Patient Re-evaluated prior to induction Oxygen Delivery Method: Circle system utilized Preoxygenation: Pre-oxygenation with 100% oxygen Induction Type: IV induction Laryngoscope Size: Mac and 4 Grade View: Grade I Tube type: Oral Tube size: 7.5 mm Number of attempts: 1 Airway Equipment and Method: Stylet Placement Confirmation: ETT inserted through vocal cords under direct vision,  positive ETCO2 and breath sounds checked- equal and bilateral Secured at: 22 cm Tube secured with: Tape Dental Injury: Teeth and Oropharynx as per pre-operative assessment

## 2017-07-23 NOTE — Progress Notes (Signed)
[place] for heparin Indication: new onset atrial fibrillation  Allergies  Allergen Reactions  . Iodine     REACTION: neck swells    Patient Measurements: Height: 5\' 9"  (175.3 cm) Weight: 169 lb (76.7 kg) IBW/kg (Calculated) : 70.7 Heparin Dosing Weight: 76 kg  Vital Signs: Temp: 98.7 F (37.1 C) (09/19 0417) Temp Source: Oral (09/19 0417) BP: 102/55 (09/19 0417) Pulse Rate: 72 (09/19 0417)  Labs:  Recent Labs  07/21/17 0300 07/21/17 1250 07/22/17 0459 07/22/17 2017 07/23/17 0408  HGB 14.2  --  13.6  --  13.4  HCT 42.6  --  40.4  --  40.9  PLT 234  --  201  --  188  HEPARINUNFRC  --   --   --  0.42 0.32  CREATININE 0.91  --  0.78  --   --   TROPONINI  --  <0.03  --   --   --     Estimated Creatinine Clearance: 78.6 mL/min (by C-G formula based on SCr of 0.78 mg/dL).   Medical History: Past Medical History:  Diagnosis Date  . Alcoholism ([place])    past problem per pt but admits to still drinking - unclear how much  . Anxiety   . Arthritis   . 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  [name] GERD (gastroesophageal reflux disease)   . Hematuria    refuses work up or referral - understands risks of morbidity / mortality - 11/2008, 12/2008  . History of hiatal hernia   . Hyperlipemia   . Kidney cyst, acquired   . Kidney stones   . Meningioma ([place]) 10/25/2013   Follows with Dr. [name].   . Peripheral vascular disease ([place])    Abdominal Aortic Aneursym  . Pneumonia    as a child  . Radiation 09/18/15-10/25/15   left lower lobe 70.2 Gy  . Tobacco abuse     Assessment: Patient is a 76 y.o M presented to the ED on 07/21/17 with c/o RUQ abd pain and was found to have acute cholecystitis.  Plan for possible cholecystectomy on 07/23/17.  To start heparin drip for new onset afib.  Today, 07/23/2017:  Heparin level therapeutic this morning  (now at lower end therapeutic range)  CBC: Hgb and Pltc WNL  Goal of Therapy:  Heparin level 0.3-0.7 units/ml Monitor platelets by anticoagulation protocol: Yes   Plan:   Continue heparin at 1100 units/hr   Plan for OR at 12:35 per schedule, f/u post-op anticoagulation plans - CCS Team-, please advise when you want heparin to be turned off prior to procedure.  Doreene Eland, PharmD, BCPS.   Pager: [phone] 07/23/2017 5:17 AM

## 2017-07-23 NOTE — Progress Notes (Signed)
Day of Surgery    DP:OEUMPNTIR pain  Subjective: Still having some pain and says pill take care of it.  No other complaints.  Objective: Vital signs in last 24 hours: Temp:  [97.8 F (36.6 C)-98.7 F (37.1 C)] 98.7 F (37.1 C) (09/19 0417) Pulse Rate:  [71-78] 72 (09/19 0417) Resp:  [18-20] 18 (09/19 0417) BP: (102-126)/(55-63) 102/55 (09/19 0417) SpO2:  [89 %-96 %] 96 % (09/19 0417) Last BM Date: 07/20/17 120 Po  300 IV 1700 urine Afebrile, VSS Labs OK ECHO:  - Left ventricle: The cavity size was normal. Systolic function was   normal. The estimated ejection fraction was in the range of 55%   to 60%. Wall motion was normal; there were no regional wall   motion abnormalities. Features are consistent with a pseudonormal   left ventricular filling pattern, with concomitant abnormal   relaxation and increased filling pressure (grade 2 diastolic   dysfunction). - Aortic valve: Valve area (VTI): 2.93 cm^2. Valve area (Vmax):   2.78 cm^2. Valve area (Vmean): 2.58 cm^2. - Right atrium: The atrium was mildly dilated. - Pulmonary arteries: Systolic pressure was mildly increased. PA   peak pressure: 38 mm Hg (S). Intake/Output from previous day: 09/18 0701 - 09/19 0700 In: 314.3 [P.O.:120; I.V.:194.3] Out: 1700 [Urine:1700] Intake/Output this shift: No intake/output data recorded.  General appearance: alert, cooperative and no distress Resp: clear to auscultation bilaterally GI: soft still tender RUQ.  Lab Results:   Recent Labs  07/22/17 0459 07/23/17 0408  WBC 16.4* 14.9*  HGB 13.6 13.4  HCT 40.4 40.9  PLT 201 188    BMET  Recent Labs  07/22/17 0459 07/23/17 0408  NA 137 137  K 3.3* 3.8  CL 100* 102  CO2 29 28  GLUCOSE 137* 115*  BUN 9 8  CREATININE 0.78 0.68  CALCIUM 8.3* 8.2*   PT/INR No results for input(s): LABPROT, INR in the last 72 hours.   Recent Labs Lab 07/21/17 0300 07/22/17 0459 07/23/17 0408  AST 17 14* 13*  ALT 15* 12* 13*   ALKPHOS 73 71 69  BILITOT 0.2* 0.6 0.6  PROT 6.5 5.9* 5.7*  ALBUMIN 3.4* 3.0* 2.7*     Lipase     Component Value Date/Time   LIPASE 23 07/21/2017 0300     Medications: . diltiazem  120 mg Oral Daily  . ipratropium-albuterol  3 mL Nebulization BID  . mometasone-formoterol  2 puff Inhalation BID  . pantoprazole (PROTONIX) IV  40 mg Intravenous Q24H  . pravastatin  80 mg Oral Daily   Anti-infectives    Start     Dose/Rate Route Frequency Ordered Stop   07/21/17 2200  piperacillin-tazobactam (ZOSYN) IVPB 3.375 g  Status:  Discontinued     3.375 g 12.5 mL/hr over 240 Minutes Intravenous Every 8 hours 07/21/17 1605 07/22/17 1330   07/21/17 0815  cefTRIAXone (ROCEPHIN) 2 g in dextrose 5 % 50 mL IVPB     2 g 100 mL/hr over 30 Minutes Intravenous  Once 07/21/17 0810 07/21/17 1034     Assessment/Plan Cholecystitis with cholelithiasis - gallstone neck of GB AF with RVR - New Hx of tobacco abuse - discontinued with Rad Rx for lung cancer Hx of ETOH abuse Lung cancer COPD PVD/AAA Hypertension FEN:  IV fluids/NPO ID:  Rocephin x 1 9/17; Zosyn 9/17 -9/18 stopped yesterday 1330 hrs.   DVT:  Heparin    Plan:  Resume fluids, IV zosyn, surgery later today. Assuming Cardiology will agree.  LOS: 1 day    Symir Mah 07/23/2017 [phone]

## 2017-07-23 NOTE — Progress Notes (Signed)
Patient transferred from ICU.  Stable at time of transfer; VSS and placed on telemetry, confirmed with CMT.  Agree with previous RN's assessment of patient.  Patient oriented to unit and equipment.  Will continue to monitor.

## 2017-07-23 NOTE — Progress Notes (Signed)
Patient went into A.fib, 2 EKGs obtained. Patient converted back in sinus rhythm after EKG. Vital signs stable. Patient was complaining of slight shortness of breath, but is on 3L o2 via [place]. O2 sats are 95. Dr. [place] with cardiology notified. Verbal order for 10mg  IV Push cardizem Q2Hours as needed for rapid a.fib received. Will continue to monitor patient closely.

## 2017-07-23 NOTE — Op Note (Addendum)
PATIENT:  Robert Bates  76 y.o. male  PRE-OPERATIVE DIAGNOSIS:  cholecystitis  POST-OPERATIVE DIAGNOSIS:  cholecystitis  PROCEDURE:  Procedure(s): LAPAROSCOPIC CHOLECYSTECTOMY   SURGEON:  Surgeon(s): Ardeth Repetto, Arta Bruce, MD  ASSISTANT: none  ANESTHESIA:   local and general  Indications for procedure: [name] is a 76 y.o. male with symptoms of Abdominal pain and Nausea and vomiting consistent with gallbladder disease, Confirmed by Ultrasound.  Description of procedure: The patient was brought into the operative suite, placed supine. Anesthesia was administered with endotracheal tube. Patient was strapped in place and foot board was secured. All pressure points were offloaded by foam padding. The patient was prepped and draped in the usual sterile fashion.  A small incision was made to the right of the umbilicus. A 2mm trocar was inserted into the peritoneal cavity with optical entry. Pneumoperitoneum was applied with high flow low pressure. 2 67mm trocars were placed in the RUQ. A 21mm trocar was placed in the subxiphoid space. All trocars sites were first anesthesized with 0.25% marcaine with epinephrine in the subcutaneous and preperitoneal layers. Next the patient was placed in reverse trendelenberg. The gallbladder was inflamed, tense and distended. It was unable to grasp so a suction device was used to remove some of the bile which was thick and brown in color.  The gallbladder was retracted cephalad and lateral. The peritoneum was reflected off the infundibulum working lateral to medial. The cystic duct and cystic artery were identified and further dissection revealed a critical view. The cystic duct and cystic artery were doubly clipped and ligated.   The gallbladder was removed off the liver bed with cautery. The Gallbladder was placed in a specimen bag. The gallbladder fossa was irrigated and hemostasis was applied with cautery. The gallbladder was removed via the 84mm  trocar. The fascial defect was closed with interrupted 0 vicryl suture via laparoscopic trans-fascial suture passer. Pneumoperitoneum was removed, all trocar were removed. All incisions were closed with 4-0 monocryl subcuticular stitch. He went into a fib with RVR at termination of the case and converted after esmolol and valsalva maneuver. The patient woke from anesthesia and was brought to PACU in stable condition. All counts were correct  Findings: inflamed gallbladder with multiple stones  Specimen: gallbladder  Blood loss: Total I/O In: 1000 [I.V.:1000] Out: -  ml  Local anesthesia: 30 ml 1.6% marcaine  Complications: He went into a fib with RVR at termination of the case and converted after esmolol and valsalva maneuver.   PLAN OF CARE: Admit to inpatient   PATIENT DISPOSITION:  PACU - hemodynamically stable.  [name], M.D. General, Bariatric, & Minimally Invasive Surgery [facility]

## 2017-07-24 DIAGNOSIS — K819 Cholecystitis, unspecified: Secondary | ICD-10-CM | POA: Diagnosis not present

## 2017-07-24 DIAGNOSIS — I480 Paroxysmal atrial fibrillation: Secondary | ICD-10-CM | POA: Diagnosis not present

## 2017-07-24 LAB — CBC
HCT: 40.5 % (ref 39.0–52.0)
Hemoglobin: 13.3 g/dL (ref 13.0–17.0)
MCH: 30.4 pg (ref 26.0–34.0)
MCHC: 32.8 g/dL (ref 30.0–36.0)
MCV: 92.5 fL (ref 78.0–100.0)
Platelets: 211 10*3/uL (ref 150–400)
RBC: 4.38 MIL/uL (ref 4.22–5.81)
RDW: 13.5 % (ref 11.5–15.5)
WBC: 14.9 10*3/uL — ABNORMAL HIGH (ref 4.0–10.5)

## 2017-07-24 LAB — COMPREHENSIVE METABOLIC PANEL
ALT: 38 U/L (ref 17–63)
AST: 41 U/L (ref 15–41)
Albumin: 2.9 g/dL — ABNORMAL LOW (ref 3.5–5.0)
Alkaline Phosphatase: 75 U/L (ref 38–126)
Anion gap: 9 (ref 5–15)
BUN: 13 mg/dL (ref 6–20)
CO2: 25 mmol/L (ref 22–32)
Calcium: 8.2 mg/dL — ABNORMAL LOW (ref 8.9–10.3)
Chloride: 103 mmol/L (ref 101–111)
Creatinine, Ser: 0.75 mg/dL (ref 0.61–1.24)
GFR calc Af Amer: >60 mL/min (ref >60)
GFR calc non Af Amer: >60 mL/min (ref >60)
Glucose, Bld: 211 mg/dL — ABNORMAL HIGH (ref 65–99)
Potassium: 3.9 mmol/L (ref 3.5–5.1)
Sodium: 137 mmol/L (ref 135–145)
Total Bilirubin: 0.4 mg/dL (ref 0.3–1.2)
Total Protein: 6.1 g/dL — ABNORMAL LOW (ref 6.5–8.1)

## 2017-07-24 LAB — MAGNESIUM: Magnesium: 2.1 mg/dL (ref 1.7–2.4)

## 2017-07-24 MED ORDER — APIXABAN 5 MG PO TABS
5.0000 mg | ORAL_TABLET | Freq: Two times a day (BID) | ORAL | Status: DC
  Administered 2017-07-24: 5 mg via ORAL
  Filled 2017-07-24: qty 1

## 2017-07-24 MED ORDER — HYDROCODONE-ACETAMINOPHEN 5-325 MG PO TABS: 1.0000 | ORAL_TABLET | ORAL | 0 refills | Status: AC | PRN

## 2017-07-24 MED ORDER — POLYETHYLENE GLYCOL 3350 17 G PO PACK
17.0000 g | PACK | Freq: Two times a day (BID) | ORAL | Status: DC
  Administered 2017-07-24: 17 g via ORAL
  Filled 2017-07-24: qty 1

## 2017-07-24 MED ORDER — DILTIAZEM HCL ER COATED BEADS 120 MG PO CP24: 120.0000 mg | ORAL_CAPSULE | Freq: Every day | ORAL | 0 refills | Status: DC

## 2017-07-24 MED ORDER — BISACODYL 10 MG RE SUPP: 10.0000 mg | Freq: Every day | RECTAL | Status: DC | PRN

## 2017-07-24 MED ORDER — APIXABAN 5 MG PO TABS: 5.0000 mg | ORAL_TABLET | Freq: Two times a day (BID) | ORAL | 0 refills | Status: DC

## 2017-07-24 MED ORDER — POLYETHYLENE GLYCOL 3350 17 G PO PACK: 17.0000 g | PACK | Freq: Two times a day (BID) | ORAL | 0 refills | Status: DC

## 2017-07-24 MED FILL — POLYETHYLENE GLYCOL 3350: 14 days supply | Qty: 255 | Fill #0

## 2017-07-24 MED FILL — HYDROCODON-APAP 5-325: 5-325 | 2 days supply | Qty: 12 | Fill #0

## 2017-07-24 MED FILL — CARTIA XT 120 MG CAPSULE SA: 120 | 30 days supply | Qty: 30 | Fill #0

## 2017-07-24 MED FILL — ELIQUIS 5 MG TABLET: 5 | 30 days supply | Qty: 60 | Fill #0

## 2017-07-24 NOTE — Care Management Note (Signed)
Case Management Note  Patient Details  Name: Robert Bates MRN: [id number] Date of Birth: [birth year]  Subjective/Objective:s/p lap chole. CM referral for eliquis coupon, & cost. Noted pharmacy has already provided eliquis 30 day free trial discount coupon. Spouse had called insurance benefit for eliquis-co pay $45/tier 3. No further CM needs.                    Action/Plan:d/c home.   Expected Discharge Date:   (unknown)               Expected Discharge Plan:  Home/Self Care  In-House Referral:     Discharge planning Services  CM Consult  Post Acute Care Choice:    Choice offered to:     DME Arranged:    DME Agency:     HH Arranged:    [place] Agency:     Status of Service:  Completed, signed off  If discussed at [employer] of Stay Meetings, dates discussed:    Additional Comments:  Dessa Phi, RN 07/24/2017, 2:46 PM

## 2017-07-24 NOTE — Discharge Summary (Signed)
Physician Discharge Summary  Robert Bates JSE:[id number] DOB: 08-Jul-1941 DOA: 07/21/2017  PCP: Robert Alar, NP  Admit date: 07/21/2017 Discharge date: 07/24/2017  Time spent: over 30 minutes  Recommendations for Outpatient Follow-up:  1. Follow up outpatient BMP/CBC 2. Discontinued ASA with initiation of eliquis given ASA for primary prevention 3.  Follow up rate with diltiazem 4. Follow up with surgery and cardiology  Discharge Diagnoses:  Active Problems:   Cholecystitis   Discharge Condition: stable  Diet recommendation: heart healthy  Filed Weights   07/21/17 0236 07/23/17 1620  Weight: 76.7 kg (169 lb) 76.2 kg (167 lb 15.9 oz)    History of present illness:  Robert Bates 76 year old male with history of gallstones, alcoholism, tobacco abuse, lung cancer, COPD, GERD, AAA, HTN, PVD. He presents to the hospital with chief complaint of epigastric abdominal pain with radiation to his back, nausea, vomiting. CT scan 10 days ago showed gallstones without cholecystitis. However, ultrasound in the emergency department did reveal cholecystitis. EKG also showed new onset atrial fibrillation. Patient was admitted for further evaluation for cholecystitis as well as cardiac evaluation prior to surgery.  Hospital Course:  Acute gallstone cholecystitis -s/p cholecystectomy 9/19  New-onset atrial fibrillation  - d/c with cardizem 120 mg and eliquis BID -CHADSVASc 4 -eliquis BID -Started on Cardizem 120 mg CD -Echocardiogram showed grade 2 diastolic dysfunction  -Cardiology following  Hypokalemia -Replace   Hypomagnesemia -Replace   HTN -BP stable currently   HLD -Continue pravachol   Procedures:  Echo with EF 07-37%, grade 2 diastolic dysfunction, increased PASP (i.e. Studies not automatically included, echos, thoracentesis, etc; not x-rays)  Consultations:  Surgery, cardiology  Discharge Exam: Vitals:   07/24/17 1246 07/24/17 1318  BP:  113/73   Pulse:  64  Resp:  18  Temp:  98 F (36.7 C)  SpO2: 96% 98%   Feels ready to go home.    General: No acute distress. Cardiovascular: Heart sounds show Robert Bates regular rate, and rhythm. No gallops or rubs. No murmurs. No JVD. Lungs: Clear to auscultation bilaterally with good air movement. No rales, rhonchi or wheezes. Abdomen: Soft, nondistended with normal active bowel sounds. No masses. No hepatosplenomegaly.  Laparoscopic incisions.  Appropriately tender. Neurological: Alert and oriented 3. Moves all extremities 4 with equal strength. Cranial nerves II through XII grossly intact. Skin: Warm and dry. No rashes or lesions. Extremities: No clubbing or cyanosis. No edema. Pedal pulses 2+. Psychiatric: Mood and affect are normal. Insight and judgment are appropriate.  Discharge Instructions   Discharge Instructions    Call MD for:  persistant dizziness or light-headedness    Complete by:  As directed    Call MD for:  persistant nausea and vomiting    Complete by:  As directed    Call MD for:  redness, tenderness, or signs of infection (pain, swelling, redness, odor or green/yellow discharge around incision site)    Complete by:  As directed    Call MD for:  temperature >100.4    Complete by:  As directed    Diet - low sodium heart healthy    Complete by:  As directed    Discharge instructions    Complete by:  As directed    You were here for cholecystitis and had your gallbladder removed.  You were also found to have atrial fibrillation.  We are discharging you on eliquis and diltiazem for the atrial fibrillation.  Please follow up with surgery and cardiology as an outpatient.  Please also  follow up with your PCP.   Increase activity slowly    Complete by:  As directed      Discharge Medication List as of 07/24/2017  4:55 PM    START taking these medications   Details  apixaban (ELIQUIS) 5 MG TABS tablet Take 1 tablet (5 mg total) by mouth 2 (two) times daily., Starting Thu  07/24/2017, Until Sat 08/23/2017, Normal    diltiazem (CARDIZEM CD) 120 MG 24 hr capsule Take 1 capsule (120 mg total) by mouth daily., Starting Fri 07/25/2017, Until Sun 08/24/2017, Normal    HYDROcodone-acetaminophen (NORCO/VICODIN) 5-325 MG tablet Take 1 tablet by mouth every 4 (four) hours as needed for moderate pain., Starting Thu 07/24/2017, Until Sun 07/27/2017, Print    polyethylene glycol (MIRALAX / GLYCOLAX) packet Take 17 g by mouth 2 (two) times daily., Starting Thu 07/24/2017, Normal      CONTINUE these medications which have NOT CHANGED   Details  albuterol (PROVENTIL HFA;VENTOLIN HFA) 108 (90 Base) MCG/ACT inhaler Inhale 2 puffs into the lungs every 6 (six) hours as needed for wheezing or shortness of breath., Starting Fri 03/14/2017, Normal    budesonide-formoterol (SYMBICORT) 160-4.5 MCG/ACT inhaler Inhale 2 puffs into the lungs 2 (two) times daily., Starting Fri 03/14/2017, Normal    diphenhydrAMINE (BENADRYL) 50 MG tablet Take 1 tablet ( 50mg ) 1 hour before Scan, Normal    meloxicam (MOBIC) 7.5 MG tablet TAKE 1 TABLET (7.5 MG TOTAL) BY MOUTH DAILY., Normal    omeprazole (PRILOSEC) 40 MG capsule Take 1 capsule (40 mg total) by mouth daily. PATIENT NEEDS OFFICE VISIT FOR FURTHER REFILLS, Starting Fri 03/14/2017, Normal    ondansetron (ZOFRAN) 4 MG tablet Take 1 tablet (4 mg total) by mouth every 8 (eight) hours as needed for nausea or vomiting., Starting Fri 07/11/2017, Normal    pravastatin (PRAVACHOL) 80 MG tablet Take 1 tablet (80 mg total) by mouth daily., Starting Fri 03/14/2017, Normal    predniSONE (DELTASONE) 50 MG tablet Take 1 tablet 13 hours prior to scan, take 1 tablet 7 hours prior to scan and take 1 tablet 1 hour prior to scan., Normal    traMADol (ULTRAM) 50 MG tablet Take 1 tablet (50 mg total) by mouth every 6 (six) hours as needed., Starting Fri 07/11/2017, Print      STOP taking these medications     aspirin EC 81 MG tablet        Allergies  Allergen  Reactions  . Iodine     REACTION: neck swells   Follow-up Information    [facility] [place] Follow up on 08/07/2017.   Specialty:  General Surgery Why:  Your appointment is at 2:30 PM. Be at the office 30 minutes early for check-in. Bring insurance information and Photo ID. Contact information: [address] [address] [phone]        [facility] [place] Follow up.   Specialty:  General Surgery Contact information: [address] [place] [address] [phone]        Nahser, Wonda Cheng, MD. Schedule an appointment as soon as possible for Elyzabeth Goatley visit.   Specialty:  Cardiology Contact information: [place] [address] [phone]        Robert Alar, NP Follow up.   Specialty:  Internal Medicine Contact information: [place] [address] [phone]            The results of significant diagnostics  from this hospitalization (including imaging, microbiology, ancillary and laboratory) are listed below for reference.    Significant Diagnostic Studies: Ct Abdomen Pelvis W Contrast  Result Date: 07/12/2017 CLINICAL DATA:  Right upper quadrant abdominal pain. EXAM: CT ABDOMEN AND PELVIS WITH CONTRAST TECHNIQUE: Multidetector CT imaging of the abdomen and pelvis was performed using the standard protocol following bolus administration of intravenous contrast. CONTRAST:  116mL ISOVUE-300 IOPAMIDOL (ISOVUE-300) INJECTION 61% COMPARISON:  PET scan of January 31, 2017.  CT scan of January 21, 2013. FINDINGS: Lower chest: No acute abnormality. Hepatobiliary: Large gallstone is noted without inflammation. Stable hepatic cysts. Pancreas: Unremarkable. No pancreatic ductal dilatation or surrounding inflammatory changes. Spleen: 2 rounded low densities are noted in the spleen most consistent with hemangiomas. Adrenals/Urinary Tract: Adrenal glands appear normal. Stable  exophytic complex cyst arising from left kidney. Nonobstructive left renal calculus is noted. No hydronephrosis or renal obstruction is noted. Large bladder calculus is noted. Stomach/Bowel: Moderate size sliding-type hiatal hernia is noted. Stomach is full of debris. There is no evidence of bowel obstruction or ileus. The appendix appears normal. Sigmoid diverticulosis is noted without inflammation. Vascular/Lymphatic: Aortic atherosclerosis. No enlarged abdominal or pelvic lymph nodes. Reproductive: Mild prostatic enlargement. Other: Mild fat containing right inguinal hernia. No abnormal fluid collection is noted. Musculoskeletal: No acute or significant osseous findings. IMPRESSION: Cholelithiasis without inflammation. Stable hepatic cysts. Stable exophytic complex cyst arising from left kidney. Nonobstructive left nephrolithiasis without hydronephrosis or renal obstruction. Large bladder calculus is noted. Moderate size sliding-type hiatal hernia. Sigmoid diverticulosis without inflammation. Aortic atherosclerosis. Stable mild prostatic enlargement. Mild fat containing right inguinal hernia. Electronically Signed   By: Marijo Conception, M.D.   On: 07/12/2017 07:33   US Abdomen Limited Ruq  Result Date: 07/21/2017 CLINICAL DATA:  Generalized abdominal pain and nausea for 1 month. EXAM: ULTRASOUND ABDOMEN LIMITED RIGHT UPPER QUADRANT COMPARISON:  CT scan of July 11, 2017. FINDINGS: Gallbladder: Large gallstone measuring 2.9 cm is noted in the neck with moderate gallbladder wall thickening measured at 5 mm and pericholecystic fluid. However, no sonographic Murphy's sign is noted. Common bile duct: Diameter: 5.6 mm which is within normal limits. Liver: 1.3 cm right hepatic cyst is noted. Within normal limits in parenchymal echogenicity. Portal vein is patent on color Doppler imaging with normal direction of blood flow towards the liver. IMPRESSION: Large solitary gallstone is noted in the neck of the  gallbladder. Moderate gallbladder wall thickening and pericholecystic fluid is noted concerning for cholecystitis. HIDA scan is recommended for further evaluation. Electronically Signed   By: Marijo Conception, M.D.   On: 07/21/2017 07:51    Microbiology: Recent Results (from the past 240 hour(s))  Surgical pcr screen     Status: None   Collection Time: 07/23/17  4:37 AM  Result Value Ref Range Status   MRSA, PCR NEGATIVE NEGATIVE Final   Staphylococcus aureus NEGATIVE NEGATIVE Final    Comment: (NOTE) The Xpert SA Assay (FDA approved for NASAL specimens in patients 47 years of age and older), is one component of Lamir Racc[facility] comprehensive surveillance program. It is not intended to diagnose infection nor to guide or monitor treatment.      Labs: Basic Metabolic Panel:  Recent Labs Lab 07/21/17 0300 07/22/17 0459 07/23/17 0408 07/23/17 1436 07/24/17 0548  NA 136 137 137  --  137  K 3.5 3.3* 3.8  --  3.9  CL 101 100* 102  --  103  CO2 27 29 28   --  25  GLUCOSE  143* 137* 115*  --  211*  BUN 20 9 8   --  13  CREATININE 0.91 0.78 0.68  --  0.75  CALCIUM 8.7* 8.3* 8.2*  --  8.2*  MG  --  1.5* 1.9 1.8 2.1   Liver Function Tests:  Recent Labs Lab 07/21/17 0300 07/22/17 0459 07/23/17 0408 07/24/17 0548  AST 17 14* 13* 41  ALT 15* 12* 13* 38  ALKPHOS 73 71 69 75  BILITOT 0.2* 0.6 0.6 0.4  PROT 6.5 5.9* 5.7* 6.1*  ALBUMIN 3.4* 3.0* 2.7* 2.9*    Recent Labs Lab 07/21/17 0300  LIPASE 23   No results for input(s): AMMONIA in the last 168 hours. CBC:  Recent Labs Lab 07/21/17 0300 07/22/17 0459 07/23/17 0408 07/24/17 0548  WBC 12.9* 16.4* 14.9* 14.9*  NEUTROABS 11.2*  --   --   --   HGB 14.2 13.6 13.4 13.3  HCT 42.6 40.4 40.9 40.5  MCV 93.0 91.0 93.4 92.5  PLT 234 201 188 211   Cardiac Enzymes:  Recent Labs Lab 07/21/17 1250 07/23/17 1436  CKTOTAL  --  30*  CKMB  --  4.9  TROPONINI <0.03 <0.03   BNP: BNP (last 3 results) No results for input(s): BNP in the  last 8760 hours.  ProBNP (last 3 results) No results for input(s): PROBNP in the last 8760 hours.  CBG: No results for input(s): GLUCAP in the last 168 hours.     Signed:  Fayrene Helper MD.  [facility] 07/24/2017, 9:27 PM

## 2017-07-24 NOTE — Progress Notes (Signed)
Progress Note: General Surgery Service   Assessment/Plan: Patient Active Problem List   Diagnosis Date Noted  . Cholecystitis 07/21/2017  . COPD GOLD II  04/03/2017  . 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 Neck pain 02/06/2015  . Lung cancer ([place]) 11/07/2014  . 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 Wellness examination 09/27/2014  . HTN (hypertension) 04/29/2014  . Meningioma ([place]) 10/25/2013  . Low back pain 10/25/2013  . 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 Cerumen impaction 08/18/2012  . Abnormal chest CT 08/18/2012  . KERATOSIS 10/09/2010  . SCIATICA, RIGHT 10/09/2010  . UNSPECIFIED HEARING LOSS 05/21/2010  . Hyperlipidemia 05/14/2010  . LEUKOCYTOSIS 05/14/2010  . 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[place]) 01/29/2010  . LIPOMA 11/17/2009  . MICROSCOPIC HEMATURIA 08/11/2008  . GERD 07/26/2008  . DERMOID CYST 07/25/2008   s/p Procedure(s): LAPAROSCOPIC CHOLECYSTECTOMY 07/23/2017 POD 1 lap chole -a fib post op, resolved with diltiazem -continue diet -f/u cardiology recs -from surgery standpoint can go home once medically stabilized    LOS: 1 day  Chief Complaint/Subjective: Epigastric incisional pain especially with movement  Objective: Vital signs in last 24 hours: Temp:  [97.9 F (36.6 C)-98.7 F (37.1 C)] 98.7 F (37.1 C) (09/20 0531) Pulse Rate:  [53-161] 63 (09/20 0531) Resp:  [11-31] 18 (09/20 0531) BP: (94-134)/(53-102) 120/63 (09/20 0531) SpO2:  [85 %-99 %] 98 % (09/20 0758) FiO2 (%):  [45 %] 45 % (09/19 1620) Weight:  [76.2 kg (167 lb 15.9 oz)] 76.2 kg (167 lb 15.9 oz) (09/19 1620) Last BM Date: 07/21/17  Intake/Output from previous day: 09/19 0701 - 09/20 0700 In: 4362.9 [P.O.:240; I.V.:4122.9] Out: 1675 [Urine:1675] Intake/Output this shift: No intake/output data recorded.  Lungs: CTAB  Cardiovascular: irreg irreg  Abd: soft, ATTP, incisions  c/d/i  Extremities: no edema  Neuro: AOx4  Lab Results: CBC   Recent Labs  07/23/17 0408 07/24/17 0548  WBC 14.9* 14.9*  HGB 13.4 13.3  HCT 40.9 40.5  PLT 188 211   BMET  Recent Labs  07/23/17 0408 07/24/17 0548  NA 137 137  K 3.8 3.9  CL 102 103  CO2 28 25  GLUCOSE 115* 211*  BUN 8 13  CREATININE 0.68 0.75  CALCIUM 8.2* 8.2*   PT/INR No results for input(s): LABPROT, INR in the last 72 hours. ABG No results for input(s): PHART, HCO3 in the last 72 hours.  Invalid input(s): PCO2, PO2  Studies/Results:  Anti-infectives: Anti-infectives    Start     Dose/Rate Route Frequency Ordered Stop   07/23/17 1000  piperacillin-tazobactam (ZOSYN) IVPB 3.375 g  Status:  Discontinued     3.375 g 12.5 mL/hr over 240 Minutes Intravenous Every 8 hours 07/23/17 0858 07/23/17 1752   07/21/17 2200  piperacillin-tazobactam (ZOSYN) IVPB 3.375 g  Status:  Discontinued     3.375 g 12.5 mL/hr over 240 Minutes Intravenous Every 8 hours 07/21/17 1605 07/22/17 1330   07/21/17 0815  cefTRIAXone (ROCEPHIN) 2 g in dextrose 5 % 50 mL IVPB     2 g 100 mL/hr over 30 Minutes Intravenous  Once 07/21/17 0810 07/21/17 1034      Medications: Scheduled Meds: . diltiazem  120 mg Oral Daily  . ipratropium-albuterol  3 mL Nebulization BID  . mometasone-formoterol  2 puff Inhalation BID  . pantoprazole (PROTONIX) IV  40 mg Intravenous Q24H  . pravastatin  80 mg Oral Daily   Continuous Infusions: . dextrose  5 % and 0.45% NaCl 100 mL/hr at 07/24/17 0426   PRN Meds:.albuterol, diltiazem, HYDROcodone-acetaminophen, morphine injection, ondansetron (ZOFRAN) IV  Mickeal Skinner, MD Pg# [phone] [facility]

## 2017-07-24 NOTE — Progress Notes (Signed)
SATURATION QUALIFICATIONS: (This note is used to comply with regulatory documentation for home oxygen)  Patient Saturations on Room Air at Rest = 96%  Patient Saturations on Room Air while Ambulating = 96-98%

## 2017-07-24 NOTE — Progress Notes (Signed)
PROGRESS NOTE    Robert Bates  EHU:[id number] DOB: 07/24/1941 DOA: 07/21/2017 PCP: [name], NP    Brief Narrative:  Robert Bates is Robert Bates 76 year old male with history of gallstones, alcoholism, tobacco abuse, lung cancer, COPD, GERD, AAA, HTN, PVD. He presents to the hospital with chief complaint of epigastric abdominal pain with radiation to his back, nausea, vomiting. CT scan 10 days ago showed gallstones without cholecystitis. However, ultrasound in the emergency department did reveal cholecystitis. EKG also showed new onset atrial fibrillation. Patient was admitted for further evaluation for cholecystitis as well as cardiac evaluation prior to surgery.    Assessment & Plan:   Active Problems:   Cholecystitis   Acute gallstone cholecystitis -s/p cholecystectomy 9/19 - ok for d/c per surgery  New-onset atrial fibrillation  Afib with RVR postop improved with esmolol and valsalva, currently sinus rhythm - on dilt gtt yesterday, but this has been d/c'd.  RVR last night, but did not require any IV meds.   Now rate controlled -CHADSVASc 4 -eliquis BID -Started on Cardizem 120 mg CD -Echocardiogram showed grade 2 diastolic dysfunction  -Cardiology following  Hypokalemia -Replace   Hypomagnesemia -Replace   HTN -BP stable currently   HLD -Continue pravachol    DVT prophylaxis: eliquis Code Status: full  Family Communication: wife in room Disposition Plan: pending surgery   Consultants:   Cardiology  Surgery  Procedures: (Don't include imaging studies which can be auto populated. Include things that cannot be auto populated i.e. Echo, Carotid and venous dopplers, Foley, Bipap, HD, tubes/drains, wound vac, central lines etc)  Echo  Cholecystectomy  Antimicrobials: (specify start and planned stop date. Auto populated tables are space occupying and do not give end dates)  zosyn    Subjective: Feeling ok.  Passed Chane Cowden little gast this mornign.   No BM yet.    Objective: Vitals:   07/24/17 0531 07/24/17 0758 07/24/17 1246 07/24/17 1318  BP: 120/63   113/73  Pulse: 63   64  Resp: 18   18  Temp: 98.7 F (37.1 C)   98 F (36.7 C)  TempSrc: Oral   Oral  SpO2: 99% 98% 96% 98%  Weight:      Height:        Intake/Output Summary (Last 24 hours) at 07/24/17 1407 Last data filed at 07/24/17 1319  Gross per 24 hour  Intake          3722.92 ml  Output             2225 ml  Net          1497.92 ml   Filed Weights   07/21/17 0236 07/23/17 1620  Weight: 76.7 kg (169 lb) 76.2 kg (167 lb 15.9 oz)    Examination:  General: No acute distress. Cardiovascular: Heart sounds show Ason Heslin regular rate, and rhythm. No gallops or rubs. No murmurs. No JVD. Lungs: Clear to auscultation bilaterally with good air movement. No rales, rhonchi or wheezes. Abdomen: Soft, nontender, nondistended with normal active bowel sounds. No masses. No hepatosplenomegaly.  Laparoscopic incisions. Neurological: Alert and oriented 3. Moves all extremities 4 with equal strength. Cranial nerves II through XII grossly intact. Skin: Warm and dry. No rashes or lesions. Extremities: No clubbing or cyanosis. No edema.  Psychiatric: Mood and affect are normal. Insight and judgment are appropriate.   Data Reviewed: I have personally reviewed following labs and imaging studies  CBC:  Recent Labs Lab 07/21/17 0300 07/22/17 0459 07/23/17 0408 07/24/17 0548  WBC  12.9* 16.4* 14.9* 14.9*  NEUTROABS 11.2*  --   --   --   HGB 14.2 13.6 13.4 13.3  HCT 42.6 40.4 40.9 40.5  MCV 93.0 91.0 93.4 92.5  PLT 234 201 188 650   Basic Metabolic Panel:  Recent Labs Lab 07/21/17 0300 07/22/17 0459 07/23/17 0408 07/23/17 1436 07/24/17 0548  NA 136 137 137  --  137  K 3.5 3.3* 3.8  --  3.9  CL 101 100* 102  --  103  CO2 27 29 28   --  25  GLUCOSE 143* 137* 115*  --  211*  BUN 20 9 8   --  13  CREATININE 0.91 0.78 0.68  --  0.75  CALCIUM 8.7* 8.3* 8.2*  --  8.2*  MG  --   1.5* 1.9 1.8 2.1   GFR: Estimated Creatinine Clearance: 78.6 mL/min (by C-G formula based on SCr of 0.75 mg/dL). Liver Function Tests:  Recent Labs Lab 07/21/17 0300 07/22/17 0459 07/23/17 0408 07/24/17 0548  AST 17 14* 13* 41  ALT 15* 12* 13* 38  ALKPHOS 73 71 69 75  BILITOT 0.2* 0.6 0.6 0.4  PROT 6.5 5.9* 5.7* 6.1*  ALBUMIN 3.4* 3.0* 2.7* 2.9*    Recent Labs Lab 07/21/17 0300  LIPASE 23   No results for input(s): AMMONIA in the last 168 hours. Coagulation Profile: No results for input(s): INR, PROTIME in the last 168 hours. Cardiac Enzymes:  Recent Labs Lab 07/21/17 1250 07/23/17 1436  CKTOTAL  --  30*  CKMB  --  4.9  TROPONINI <0.03 <0.03   BNP (last 3 results) No results for input(s): PROBNP in the last 8760 hours. HbA1C: No results for input(s): HGBA1C in the last 72 hours. CBG: No results for input(s): GLUCAP in the last 168 hours. Lipid Profile: No results for input(s): CHOL, HDL, LDLCALC, TRIG, CHOLHDL, LDLDIRECT in the last 72 hours. Thyroid Function Tests:  Recent Labs  07/22/17 0843  TSH 1.023   Anemia Panel: No results for input(s): VITAMINB12, FOLATE, FERRITIN, TIBC, IRON, RETICCTPCT in the last 72 hours. Sepsis Labs: No results for input(s): PROCALCITON, LATICACIDVEN in the last 168 hours.  Recent Results (from the past 240 hour(s))  Surgical pcr screen     Status: None   Collection Time: 07/23/17  4:37 AM  Result Value Ref Range Status   MRSA, PCR NEGATIVE NEGATIVE Final   Staphylococcus aureus NEGATIVE NEGATIVE Final    Comment: (NOTE) The Xpert SA Assay (FDA approved for NASAL specimens in patients 21 years of age and older), is one component of [facility] Dain[facility] comprehensive surveillance program. It is not intended to diagnose infection nor to guide or monitor treatment.          Radiology Studies: No results found.      Scheduled Meds: . apixaban  5 mg Oral BID  . diltiazem  120 mg Oral Daily  . ipratropium-albuterol  3 mL  Nebulization BID  . mometasone-formoterol  2 puff Inhalation BID  . pantoprazole (PROTONIX) IV  40 mg Intravenous Q24H  . pravastatin  80 mg Oral Daily   Continuous Infusions: . dextrose 5 % and 0.45% NaCl 100 mL/hr at 07/24/17 0426     LOS: 1 day    Time spent: 30 minutes    Fayrene Helper, MD [facility] [phone]  If 7PM-7AM, please contact night-coverage [email or web address] Password [email or web address] 07/24/2017, 2:07 PM

## 2017-07-24 NOTE — Progress Notes (Signed)
Progress Note  Patient Name: Robert Bates Date of Encounter: 07/24/2017  Primary Cardiologist: New, Nahser  Subjective   Robert Bates a 76 y.o.malewith a hx of tobacco abuse, alcoholism, lung cancer, COPD, PVD, HLD, GERD, AAA, HTN, who is being seen today for the evaluation of Afibat the request of Dr. Maylene Roes  Inpatient Medications    Scheduled Meds: . diltiazem  120 mg Oral Daily  . ipratropium-albuterol  3 mL Nebulization BID  . mometasone-formoterol  2 puff Inhalation BID  . pantoprazole (PROTONIX) IV  40 mg Intravenous Q24H  . pravastatin  80 mg Oral Daily   Continuous Infusions: . dextrose 5 % and 0.45% NaCl 100 mL/hr at 07/24/17 0426   PRN Meds: albuterol, diltiazem, HYDROcodone-acetaminophen, morphine injection, ondansetron (ZOFRAN) IV   Vital Signs    Vitals:   07/24/17 0531 07/24/17 0758 07/24/17 1246 07/24/17 1318  BP: 120/63   113/73  Pulse: 63   64  Resp: 18   18  Temp: 98.7 F (37.1 C)   98 F (36.7 C)  TempSrc: Oral   Oral  SpO2: 99% 98% 96% 98%  Weight:      Height:        Intake/Output Summary (Last 24 hours) at 07/24/17 1335 Last data filed at 07/24/17 1319  Gross per 24 hour  Intake          4722.92 ml  Output             2225 ml  Net          2497.92 ml   Filed Weights   07/21/17 0236 07/23/17 1620  Weight: 169 lb (76.7 kg) 167 lb 15.9 oz (76.2 kg)    Telemetry    Afib, then NSR  - Personally Reviewed  ECG    NSR  - Personally Reviewed  Physical Exam   GEN: No acute distress.   Neck: No JVD Cardiac: RR, no murmurs, rubs, or gallops.  Respiratory:  decreased breath sound left side  GI:  + BS  MS: No edema; No deformity. Neuro:  Nonfocal  Psych: Normal affect   Labs    Chemistry Recent Labs Lab 07/22/17 0459 07/23/17 0408 07/24/17 0548  NA 137 137 137  K 3.3* 3.8 3.9  CL 100* 102 103  CO2 29 28 25   GLUCOSE 137* 115* 211*  BUN 9 8 13   CREATININE 0.78 0.68 0.75  CALCIUM 8.3* 8.2* 8.2*  PROT 5.9* 5.7*  6.1*  ALBUMIN 3.0* 2.7* 2.9*  AST 14* 13* 41  ALT 12* 13* 38  ALKPHOS 71 69 75  BILITOT 0.6 0.6 0.4  GFRNONAA >60 >60 >60  GFRAA >60 >60 >60  ANIONGAP 8 7 9      Hematology Recent Labs Lab 07/22/17 0459 07/23/17 0408 07/24/17 0548  WBC 16.4* 14.9* 14.9*  RBC 4.44 4.38 4.38  HGB 13.6 13.4 13.3  HCT 40.4 40.9 40.5  MCV 91.0 93.4 92.5  MCH 30.6 30.6 30.4  MCHC 33.7 32.8 32.8  RDW 13.5 13.8 13.5  PLT 201 188 211    Cardiac Enzymes Recent Labs Lab 07/21/17 1250 07/23/17 1436  TROPONINI <0.03 <0.03    Recent Labs Lab 07/21/17 0622  TROPIPOC 0.01     BNPNo results for input(s): BNP, PROBNP in the last 168 hours.   DDimer No results for input(s): DDIMER in the last 168 hours.   Radiology    No results found.  Cardiac Studies     Patient Profile     76  y.o. male admitted with cholecyctitis. Found incidentally to have atrial fib  Assessment & Plan    1.  Atrial fib:   HR is well controlled. Has been started on Eliquis 5 mg BID  Dilt 120 CD a day  Continue current meds  2.  Hyperlipidemia :  Continue meds    For questions or updates, please contact [place] Please consult [email or web address] for contact info under Cardiology/STEMI.   Continue current meds. He is stable from a cardiac standpoint and could go home when OK from a surgical standpoint. I will see him in the office.      Signed, Mertie Moores, MD  07/24/2017, 1:35 PM

## 2017-07-24 NOTE — Discharge Instructions (Signed)
[facility] ______[facility] °LAPAROSCOPIC SURGERY: POST OP INSTRUCTIONS °Always review your discharge instruction sheet given to you by the facility where your surgery was performed. °IF YOU HAVE DISABILITY OR FAMILY LEAVE FORMS, YOU MUST BRING THEM TO THE OFFICE FOR PROCESSING.   °DO NOT GIVE THEM TO YOUR DOCTOR. ° °1. A prescription for pain medication may be given to you upon discharge.  Take your pain medication as prescribed, if needed.  If narcotic pain medicine is not needed, then you may take acetaminophen (Tylenol) or ibuprofen (Advil) as needed. °2. Take your usually prescribed medications unless otherwise directed. °3. If you need a refill on your pain medication, please contact your pharmacy.  They will contact our office to request authorization. Prescriptions will not be filled after 5pm or on week-ends. °4. You should follow a light diet the first few days after arrival home, such as soup and crackers, etc.  Be sure to include lots of fluids daily. °5. Most patients will experience some swelling and bruising in the area of the incisions.  Ice packs will help.  Swelling and bruising can take several days to resolve.  °6. It is common to experience some constipation if taking pain medication after surgery.  Increasing fluid intake and taking a stool softener (such as Colace) will usually help or prevent this problem from occurring.  A mild laxative (Milk of Magnesia or Miralax) should be taken according to package instructions if there are no bowel movements after 48 hours. °7. Unless discharge instructions indicate otherwise, you may remove your bandages 24-48 hours after surgery, and you may shower at that time.  You may have steri-strips (small skin tapes) in place directly over the incision.  These strips should be left on the skin for 7-10 days.  If your surgeon used skin glue on the incision, you may shower in 24 hours.  The glue will flake off over the next 2-3 weeks.  Any sutures or  staples will be removed at the office during your follow-up visit. °8. ACTIVITIES:  You may resume regular (light) daily activities beginning the next day--such as daily self-care, walking, climbing stairs--gradually increasing activities as tolerated.  You may have sexual intercourse when it is comfortable.  Refrain from any heavy lifting or straining until approved by your doctor. °a. You may drive when you are no longer taking prescription pain medication, you can comfortably wear a seatbelt, and you can safely maneuver your car and apply brakes. °b. RETURN TO WORK:  __________________________________________________________ °9. You should see your doctor in the office for a follow-up appointment approximately 2-3 weeks after your surgery.  Make sure that you call for this appointment within a day or two after you arrive home to insure a convenient appointment time. °10. OTHER INSTRUCTIONS: __________________________________________________________________________________________________________________________ __________________________________________________________________________________________________________________________ °WHEN TO CALL YOUR DOCTOR: °1. Fever over 101.0 °2. Inability to urinate °3. Continued bleeding from incision. °4. Increased pain, redness, or drainage from the incision. °5. Increasing abdominal pain ° °The clinic staff is available to answer your questions during regular business hours.  Please don’t hesitate to call and ask to speak to one of the nurses for clinical concerns.  If you have a medical emergency, go to the nearest emergency room or call 911.  A surgeon from [facility] is always on call at the hospital. °[address], [address], [place], [place]  [address] ? [address] [address], [place], [place]   [address] °[phone] ? [phone] ? FAX [phone] °Web site:   [email or web address]    Laparoscopic Cholecystectomy, Care After This sheet  gives you information about how to care for yourself after your procedure. Your health care provider may also give you more specific instructions. If you have problems or questions, contact your health care provider. What can I expect after the procedure? After the procedure, it is common to have:  Pain at your incision sites. You will be given medicines to control this pain.  Mild nausea or vomiting.  Bloating and possible shoulder pain from the air-like gas that was used during the procedure.  Follow these instructions at home: Incision care   Follow instructions from your health care provider about how to take care of your incisions. Make sure you: ? Wash your hands with soap and water before you change your bandage (dressing). If soap and water are not available, use hand sanitizer. ? Change your dressing as told by your health care provider. ? Leave stitches (sutures), skin glue, or adhesive strips in place. These skin closures may need to be in place for 2 weeks or longer. If adhesive strip edges start to loosen and curl up, you may trim the loose edges. Do not remove adhesive strips completely unless your health care provider tells you to do that.  Do not take baths, swim, or use a hot tub until your health care provider approves. Ask your health care provider if you can take showers. You may only be allowed to take sponge baths for bathing.  Check your incision area every day for signs of infection. Check for: ? More redness, swelling, or pain. ? More fluid or blood. ? Warmth. ? Pus or a bad smell. Activity  Do not drive or use heavy machinery while taking prescription pain medicine.  Do not lift anything that is heavier than 10 lb (4.5 kg) until your health care provider approves.  Do not play contact sports until your health care provider approves.  Do not drive for 24 hours if you were given a medicine to help you relax (sedative).  Rest as needed. Do not return to work  or school until your health care provider approves. General instructions  Take over-the-counter and prescription medicines only as told by your health care provider.  To prevent or treat constipation while you are taking prescription pain medicine, your health care provider may recommend that you: ? Drink enough fluid to keep your urine clear or pale yellow. ? Take over-the-counter or prescription medicines. ? Eat foods that are high in fiber, such as fresh fruits and vegetables, whole grains, and beans. ? Limit foods that are high in fat and processed sugars, such as fried and sweet foods. Contact a health care provider if:  You develop a rash.  You have more redness, swelling, or pain around your incisions.  You have more fluid or blood coming from your incisions.  Your incisions feel warm to the touch.  You have pus or a bad smell coming from your incisions.  You have a fever.  One or more of your incisions breaks open. Get help right away if:  You have trouble breathing.  You have chest pain.  You have increasing pain in your shoulders.  You faint or feel dizzy when you stand.  You have severe pain in your abdomen.  You have nausea or vomiting that lasts for more than one day.  You have leg pain. This information is not intended to replace advice given to you by your health care provider. Make sure  you discuss any questions you have with your health care provider. Document Released: 10/21/2005 Document Revised: 05/11/2016 Document Reviewed: 04/08/2016 Elsevier Interactive Patient Education  2017 [place] on my medicine - ELIQUIS (apixaban)  This medication education was reviewed with me or my healthcare representative as part of my discharge preparation.  The pharmacist that spoke with me during my hospital stay was:  Robert Bates, [facility]  Why was Eliquis prescribed for you? Eliquis was prescribed for you to reduce the risk of a  blood clot forming that can cause a stroke if you have a medical condition called atrial fibrillation (a type of irregular heartbeat).  What do You need to know about Eliquis ? Take your Eliquis TWICE DAILY - one tablet in the morning and one tablet in the evening with or without food. If you have difficulty swallowing the tablet whole please discuss with your pharmacist how to take the medication safely.  Take Eliquis exactly as prescribed by your doctor and DO NOT stop taking Eliquis without talking to the doctor who prescribed the medication.  Stopping may increase your risk of developing a stroke.  Refill your prescription before you run out.  After discharge, you should have regular check-up appointments with your healthcare provider that is prescribing your Eliquis.  In the future your dose may need to be changed if your kidney function or weight changes by a significant amount or as you get older.  What do you do if you miss a dose? If you miss a dose, take it as soon as you remember on the same day and resume taking twice daily.  Do not take more than one dose of ELIQUIS at the same time to make up a missed dose.  Important Safety Information A possible side effect of Eliquis is bleeding. You should call your healthcare provider right away if you experience any of the following: ? Bleeding from an injury or your nose that does not stop. ? Unusual colored urine (red or dark brown) or unusual colored stools (red or black). ? Unusual bruising for unknown reasons. ? A serious fall or if you hit your head (even if there is no bleeding).  Some medicines may interact with Eliquis and might increase your risk of bleeding or clotting while on Eliquis. To help avoid this, consult your healthcare provider or pharmacist prior to using any new prescription or non-prescription medications, including herbals, vitamins, non-steroidal anti-inflammatory drugs (NSAIDs) and supplements.  This  website has more information on Eliquis (apixaban): http://www.eliquis.com/eliquis/home

## 2017-07-25 ENCOUNTER — Telehealth: Payer: Self-pay | Admitting: Behavioral Health

## 2017-07-25 NOTE — Telephone Encounter (Signed)
Transition Care Management Follow-up Telephone Call  PCP: [name], NP  Admit date: 07/21/2017 Discharge date: 07/24/2017  Time spent: over 30 minutes  Recommendations for Outpatient Follow-up:  1. Follow up outpatient BMP/CBC 2. Discontinued ASA with initiation of eliquis given ASA for primary prevention 3.  Follow up rate with diltiazem 4. Follow up with surgery and cardiology  Discharge Diagnoses:  Active Problems:   Cholecystitis   Discharge Condition: stable   How have you been since you were released from the hospital? Patient stated, "I'm doing much better than before".   Do you understand why you were in the hospital? yes, patient voiced that he had gall stones removed.   Do you understand the discharge instructions? yes   Where were you discharged to? Home with wife.   Items Reviewed:  Medications reviewed: yes  Allergies reviewed: yes  Dietary changes reviewed: yes, heart healthy diet was advised, but pt. voiced that he's consuming a regular diet.  Referrals reviewed: yes, follow-up with PCP & Cardiology.   Functional Questionnaire:   Activities of Daily Living (ADLs):   He states they are independent in the following: ambulation, bathing and hygiene, feeding, continence, grooming, toileting and dressing States they require assistance with the following: None   Any transportation issues/concerns?: no   Any patient concerns? no   Confirmed importance and date/time of follow-up visits scheduled yes, 07/30/17 at 8:00 AM.  Provider Appointment booked with [name], NP.  Confirmed with patient if condition begins to worsen call PCP or go to the ER.  Patient was given the office number and encouraged to call back with question or concerns.  : yes

## 2017-07-30 ENCOUNTER — Ambulatory Visit (INDEPENDENT_AMBULATORY_CARE_PROVIDER_SITE_OTHER): Payer: Medicare Other | Admitting: Family

## 2017-07-30 ENCOUNTER — Encounter: Payer: Self-pay | Admitting: Family

## 2017-07-30 VITALS — BP 117/67 | HR 79 | Temp 97.8°F | Resp 18 | Ht 69.0 in | Wt 166.6 lb

## 2017-07-30 DIAGNOSIS — I480 Paroxysmal atrial fibrillation: Secondary | ICD-10-CM | POA: Diagnosis not present

## 2017-07-30 DIAGNOSIS — R739 Hyperglycemia, unspecified: Secondary | ICD-10-CM

## 2017-07-30 DIAGNOSIS — R350 Frequency of micturition: Secondary | ICD-10-CM

## 2017-07-30 DIAGNOSIS — Z23 Encounter for immunization: Secondary | ICD-10-CM | POA: Diagnosis not present

## 2017-07-30 DIAGNOSIS — Z9049 Acquired absence of other specified parts of digestive tract: Secondary | ICD-10-CM | POA: Diagnosis not present

## 2017-07-30 DIAGNOSIS — N400 Enlarged prostate without lower urinary tract symptoms: Secondary | ICD-10-CM

## 2017-07-30 LAB — HEMOGLOBIN A1C: Hgb A1c MFr Bld: 6.5 % (ref 4.6–6.5)

## 2017-07-30 LAB — COMPREHENSIVE METABOLIC PANEL
ALT: 21 U/L (ref 0–53)
AST: 14 U/L (ref 0–37)
Albumin: 3.5 g/dL (ref 3.5–5.2)
Alkaline Phosphatase: 87 U/L (ref 39–117)
BUN: 20 mg/dL (ref 6–23)
CO2: 33 mEq/L — ABNORMAL HIGH (ref 19–32)
Calcium: 9.7 mg/dL (ref 8.4–10.5)
Chloride: 99 mEq/L (ref 96–112)
Creatinine, Ser: 0.81 mg/dL (ref 0.40–1.50)
GFR: 98.44 mL/min (ref >60.00)
Glucose, Bld: 89 mg/dL (ref 70–99)
Potassium: 5 mEq/L (ref 3.5–5.1)
Sodium: 138 mEq/L (ref 135–145)
Total Bilirubin: 0.5 mg/dL (ref 0.2–1.2)
Total Protein: 5.9 g/dL — ABNORMAL LOW (ref 6.0–8.3)

## 2017-07-30 LAB — URINALYSIS, ROUTINE W REFLEX MICROSCOPIC
Bilirubin Urine: NEGATIVE
Ketones, ur: NEGATIVE
Leukocytes, UA: NEGATIVE
Nitrite: NEGATIVE
Specific Gravity, Urine: 1.01 (ref 1.000–1.030)
Total Protein, Urine: NEGATIVE
Urine Glucose: NEGATIVE
Urobilinogen, UA: 0.2 (ref 0.0–1.0)
WBC, UA: NONE SEEN (ref 0–?)
pH: 7.5 (ref 5.0–8.0)

## 2017-07-30 LAB — PSA: PSA: 2.48 ng/mL (ref 0.10–4.00)

## 2017-07-30 MED ORDER — TAMSULOSIN HCL 0.4 MG PO CAPS
0.4000 mg | ORAL_CAPSULE | Freq: Every day | ORAL | 3 refills | Status: DC
Start: 2017-07-30 — End: 2017-09-16

## 2017-07-30 MED FILL — TAMSULOSIN HCL 0.4 MG CAP: 0.4 | 30 days supply | Qty: 30 | Fill #0

## 2017-07-30 NOTE — Patient Instructions (Addendum)
Please complete lab work prior to leaving. Keep your upcoming appointment with cardiology and surgery.   Start flomax for your enlarged prostate and to help with your nighttime urinary symptoms.

## 2017-07-30 NOTE — Progress Notes (Signed)
Subjective:    Patient ID: Robert Bates, male    DOB: 08-Dec-1940, 76 y.o.   MRN: [id number]  HPI  Robert Bates is a 76 yr old male who presents today for hospital follow up.  We saw the patient on September 7 with complaint of right upper quadrant pain. We obtained a CT of the abdomen and pelvis to further evaluate. CT noted gallstones. A referral was made for the patient to meet with surgery to discuss cholecystectomy. Prior to his appointment with surgery his abdominal pain worsened and he went to the emergency department on September 17. He was ultimately admitted and underwent cholecystectomy during this hospitalization. He is scheduled for follow-up with his surgeon on October 4. Reports some abdominal soreness.   His hospitalization was complicated by an episode of atrial fibrillation which was new onset. Cardiology was consulted during his hospitalization. He was placed on Cardizem 120 mg once daily as well as twice daily Eliquis. He was advised to schedule follow-up with cardiology at time of discharge. His appointment is scheduled for October 12.   He does complain of nocturia "every 30 or 40 minutes."   Review of Systems See HPI  Past Medical History:  Diagnosis Date  . Alcoholism (Robert Bates)    past problem per pt but admits to still drinking - unclear how much  . Anxiety   . Arthritis   . 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  Robert Bates GERD (gastroesophageal reflux disease)   . Hematuria    refuses work up or referral - understands risks of morbidity / mortality - 11/2008, 12/2008  . History of hiatal hernia   . Hyperlipemia   . Kidney cyst, acquired   . Kidney stones   . Meningioma ([place]) 10/25/2013   Follows with Dr. [name].   . Peripheral vascular disease ([place])    Abdominal Aortic Aneursym  . Pneumonia    as a child  . Radiation 09/18/15-10/25/15   left lower lobe 70.2 Gy  . Tobacco abuse       Social History   Social History  . Marital status: Married    Spouse name: N/A  . Number of children: 2  . Years of education: N/A   Occupational History  . Retired Scientist, research (medical)  .  [employer]   Social History Main Topics  . Smoking status: Former Smoker    Packs/day: 1.00    Years: 57.00    Quit date: 08/08/2015  . Smokeless tobacco: Former Systems developer    Types: Chew    Quit date: 11/04/1958  . Alcohol use 0.0 oz/week     Comment: Pt reports rare use  . Drug use: No  . Sexual activity: Not on file   Other Topics Concern  . Not on file   Social History Narrative  . No narrative on file    Past Surgical History:  Procedure Laterality Date  . CHOLECYSTECTOMY N/A 07/23/2017   Procedure: LAPAROSCOPIC CHOLECYSTECTOMY;  Surgeon: Bates, Robert Bruce, MD;  Location: [facility];  Service: General;  Laterality: N/A;  . COLONOSCOPY    . EYE SURGERY Bilateral    Cataracts removed w/ lens implant  . HERNIA REPAIR    . TONSILLECTOMY    . VIDEO BRONCHOSCOPY Bilateral 07/26/2015   Procedure: VIDEO BRONCHOSCOPY WITH FLUORO;  Surgeon: Robert Rockers, MD;  [facility];  Service: Cardiopulmonary;  Laterality: Bilateral;  . VIDEO BRONCHOSCOPY WITH ENDOBRONCHIAL NAVIGATION N/A 08/23/2015   Procedure: VIDEO BRONCHOSCOPY WITH ENDOBRONCHIAL NAVIGATION;  Surgeon: Robert Isaac, MD;  Location: [place];  Service: Thoracic;  Laterality: N/A;  . VIDEO BRONCHOSCOPY WITH ENDOBRONCHIAL ULTRASOUND N/A 08/23/2015   Procedure: VIDEO BRONCHOSCOPY WITH ENDOBRONCHIAL ULTRASOUND;  Surgeon: Robert Isaac, MD;  [facility];  Service: Thoracic;  Laterality: N/A;    Family History  Problem Relation Age of Onset  . Leukemia Father   . Emphysema Father   . Learning disabilities Son   . Leukemia Other   . Stroke Other     Allergies  Allergen Reactions  . Iodine     REACTION: neck swells    Current Outpatient Prescriptions on File Prior to Visit  Medication Sig  Dispense Refill  . albuterol (PROVENTIL HFA;VENTOLIN HFA) 108 (90 Base) MCG/ACT inhaler Inhale 2 puffs into the lungs every 6 (six) hours as needed for wheezing or shortness of breath. 1 Inhaler 1  . apixaban (ELIQUIS) 5 MG TABS tablet Take 1 tablet (5 mg total) by mouth 2 (two) times daily. 60 tablet 0  . budesonide-formoterol (SYMBICORT) 160-4.5 MCG/ACT inhaler Inhale 2 puffs into the lungs 2 (two) times daily. 1 Inhaler 3  . diltiazem (CARDIZEM CD) 120 MG 24 hr capsule Take 1 capsule (120 mg total) by mouth daily. 30 capsule 0  . meloxicam (MOBIC) 7.5 MG tablet TAKE 1 TABLET (7.5 MG TOTAL) BY MOUTH DAILY. 90 tablet 0  . omeprazole (PRILOSEC) 40 MG capsule Take 1 capsule (40 mg total) by mouth daily. PATIENT NEEDS OFFICE VISIT FOR FURTHER REFILLS 30 capsule 5  . pravastatin (PRAVACHOL) 80 MG tablet Take 1 tablet (80 mg total) by mouth daily. 30 tablet 5  . traMADol (ULTRAM) 50 MG tablet Take 1 tablet (50 mg total) by mouth every 6 (six) hours as needed. 20 tablet 0   No current facility-administered medications on file prior to visit.     BP 117/67 (BP Location: Right Arm, Cuff Size: Normal)   Pulse 79   Temp 97.8 F (36.6 C) (Oral)   Resp 18   Ht 5\' 9"  (1.753 m)   Wt 166 lb 9.6 oz (75.6 kg)   SpO2 99%   BMI 24.60 kg/m       Objective:   Physical Exam  Constitutional: He is oriented to person, place, and time. He appears well-developed and well-nourished. No distress.  HENT:  Head: Normocephalic and atraumatic.  Cardiovascular: Normal rate and regular rhythm.   No murmur heard. Pulmonary/Chest: Effort normal and breath sounds normal. No respiratory distress. He has no wheezes. He has no rales.  Abdominal: Soft. Bowel sounds are normal. He exhibits no distension. There is no tenderness.  Abdominal incisions are clean/dry and intact.   Musculoskeletal: He exhibits no edema.  Neurological: He is alert and oriented to person, place, and time.  Skin: Skin is warm and dry.    Psychiatric: He has a normal mood and affect. His behavior is normal. Thought content normal.  GU: Prostate is enlarged, smooth.        Assessment & Plan:  Hyperglycemia-patient was noted to have elevated sugar during his hospitalization. Will obtain A1c today.  Atrial fibrillation-rate stable today. Has upcoming appointment with cardiology. Continue diltiazem CD 120 mg once daily. He is also maintained on twice daily Eliquis.  Nocturia- prostate enlarged on exam. Check PSA, trial of flomax.    S/p cholecystectomy- clinically stable post op, incisions look good. Pt  advised to keep appointment with surgeon.   Flu shot today.

## 2017-07-30 NOTE — Addendum Note (Signed)
Addended by: Kelle Darting A on: 07/30/2017 11:40 AM   Modules accepted: Orders

## 2017-07-31 ENCOUNTER — Telehealth: Payer: Self-pay | Admitting: Family

## 2017-07-31 ENCOUNTER — Encounter: Payer: Self-pay | Admitting: Family

## 2017-07-31 DIAGNOSIS — E119 Type 2 diabetes mellitus without complications: Secondary | ICD-10-CM | POA: Insufficient documentation

## 2017-07-31 HISTORY — DX: Type 2 diabetes mellitus without complications: E11.9

## 2017-07-31 LAB — URINE CULTURE
MICRO NUMBER:: 81066777
Result:: NO GROWTH
SPECIMEN QUALITY:: ADEQUATE

## 2017-07-31 NOTE — Telephone Encounter (Signed)
Pt wife aware and will F/U at wellness visit in 1 month and will implement changes now/thx dmf

## 2017-07-31 NOTE — Telephone Encounter (Signed)
Please contact patient and let him know that his lab work reveals that he has diabetes. Current number is acceptable and we should not need to add medication at this time. However he should focus on a healthy diet, avoiding concentrated sweets, and limiting white starchy carbs.

## 2017-08-15 ENCOUNTER — Ambulatory Visit (INDEPENDENT_AMBULATORY_CARE_PROVIDER_SITE_OTHER): Payer: Medicare Other | Admitting: Physician Assistant

## 2017-08-15 ENCOUNTER — Encounter: Payer: Self-pay | Admitting: Physician Assistant

## 2017-08-15 VITALS — BP 122/70 | HR 92 | Ht 69.0 in | Wt 165.0 lb

## 2017-08-15 DIAGNOSIS — J449 Chronic obstructive pulmonary disease, unspecified: Secondary | ICD-10-CM | POA: Diagnosis not present

## 2017-08-15 DIAGNOSIS — I4819 Other persistent atrial fibrillation: Secondary | ICD-10-CM | POA: Insufficient documentation

## 2017-08-15 DIAGNOSIS — I10 Essential (primary) hypertension: Secondary | ICD-10-CM

## 2017-08-15 DIAGNOSIS — I4891 Unspecified atrial fibrillation: Secondary | ICD-10-CM | POA: Insufficient documentation

## 2017-08-15 DIAGNOSIS — I480 Paroxysmal atrial fibrillation: Secondary | ICD-10-CM

## 2017-08-15 MED ORDER — APIXABAN 5 MG PO TABS: 5.0000 mg | ORAL_TABLET | Freq: Two times a day (BID) | ORAL | 3 refills | Status: DC

## 2017-08-15 MED ORDER — DILTIAZEM HCL ER COATED BEADS 120 MG PO CP24: 120.0000 mg | ORAL_CAPSULE | Freq: Every day | ORAL | 0 refills | Status: DC

## 2017-08-15 MED FILL — ELIQUIS 5 MG TABLET: 5 | 30 days supply | Qty: 60 | Fill #0

## 2017-08-15 NOTE — Patient Instructions (Signed)
Medication Instructions:  1. Your physician recommends that you continue on your current medications as directed. Please refer to the Current Medication list given to you today.  REFILLS HAVE BEEN SENT IN FOR CARDIZEM, ELIQUIS   Labwork: NONE ORDERED TODAY  Testing/Procedures: Your physician has recommended that you wear a 48 holter monitor. Holter monitors are medical devices that record the heart's electrical activity. Doctors most often use these monitors to diagnose arrhythmias. Arrhythmias are problems with the speed or rhythm of the heartbeat. The monitor is a small, portable device. You can wear one while you do your normal daily activities. This is usually used to diagnose what is causing palpitations/syncope (passing out).    Follow-Up: KEEP YOUR APPT WITH DR. Acie Fredrickson 09/15/17   Any Other Special Instructions Will Be Listed Below (If Applicable).     If you need a refill on your cardiac medications before your next appointment, please call your pharmacy.

## 2017-08-15 NOTE — Progress Notes (Signed)
Cardiology Office Note:    Date:  08/15/2017   ID:  Robert Bates, DOB [birth year], MRN [id number]  PCP:  [name], NP  Cardiologist:  Dr. Liam Rogers    Referring MD: [name], NP   Chief Complaint  Patient presents with  . Hospitalization Follow-up    AFib, cholecystitis s/p cholecystectomy     History of Present Illness:    Robert Bates is a 76 y.o. male with a hx of former tobacco abuse, former alcohol abuse, lung CA, COPD, PAD, HL, GERD, AAA, hypertension.  He was admitted 9/17-9/20 with acute cholecystitis and AFib w/ RVR. He spontaneously converted to normal sinus rhythm, but continued to have runs of AFib/Flutter w/ RVR.   He had an echo that demonstrated normal ejection fraction.  He underwent cholecystectomy.  He was then placed on Apixaban for anticoagulation.  CHADS2-VASc=4 (age, HTN, vascular dz).  He was also placed on Diltiazem for rate control     Mr. Wyles returns for.  He is here today with his wife.  Overall, he is doing well.  He does have chronic dyspnea.  He relates this to his prior left lung radiation for cancer.  He does feel fatigued.  However, this seems to be improving since his surgery.  He denies chest discomfort, orthopnea, paroxysmal nocturnal dyspnea or edema.  Prior CV studies:   The following studies were reviewed today:  Echo 07/22/17 EF 55-60, no RWMA, Gr 2 DD, mild RAE, PASP 38  Past Medical History:  Diagnosis Date  . Alcoholism ([place])    past problem per pt but admits to still drinking - unclear how much  . Anxiety   . Arthritis   . 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  . Diabetes type 2, controlled ([place]) 07/31/2017  . GERD (gastroesophageal reflux disease)   . Hematuria    refuses work up or referral - understands risks of morbidity / mortality - 11/2008, 12/2008  . History of hiatal hernia   . Hyperlipemia   . Kidney cyst,  acquired   . Kidney stones   . Meningioma ([place]) 10/25/2013   Follows with Dr. [name].   . Peripheral vascular disease ([place])    Abdominal Aortic Aneursym  . Pneumonia    as a child  . Radiation 09/18/15-10/25/15   left lower lobe 70.2 Gy  . Tobacco abuse     Past Surgical History:  Procedure Laterality Date  . CHOLECYSTECTOMY N/A 07/23/2017   Procedure: LAPAROSCOPIC CHOLECYSTECTOMY;  Surgeon: Kinsinger, Arta Bruce, MD;  [facility];  Service: General;  Laterality: N/A;  . COLONOSCOPY    . EYE SURGERY Bilateral    Cataracts removed w/ lens implant  . HERNIA REPAIR    . TONSILLECTOMY    . VIDEO BRONCHOSCOPY Bilateral 07/26/2015   Procedure: VIDEO BRONCHOSCOPY WITH FLUORO;  Surgeon: [name], MD;  [facility];  Service: Cardiopulmonary;  Laterality: Bilateral;  . VIDEO BRONCHOSCOPY WITH ENDOBRONCHIAL NAVIGATION N/A 08/23/2015   Procedure: VIDEO BRONCHOSCOPY WITH ENDOBRONCHIAL NAVIGATION;  Surgeon: Grace Isaac, MD;  Location: [place];  Service: Thoracic;  Laterality: N/A;  . VIDEO BRONCHOSCOPY WITH ENDOBRONCHIAL ULTRASOUND N/A 08/23/2015   Procedure: VIDEO BRONCHOSCOPY WITH ENDOBRONCHIAL ULTRASOUND;  Surgeon: Grace Isaac, MD;  [facility];  Service: Thoracic;  Laterality: N/A;    Current Medications: Current Meds  Medication Sig  . albuterol (PROVENTIL HFA;VENTOLIN HFA) 108 (  90 Base) MCG/ACT inhaler Inhale 2 puffs into the lungs every 6 (six) hours as needed for wheezing or shortness of breath.  . budesonide-formoterol (SYMBICORT) 160-4.5 MCG/ACT inhaler Inhale 2 puffs into the lungs 2 (two) times daily.  [name] diltiazem (CARDIZEM CD) 120 MG 24 hr capsule Take 1 capsule (120 mg total) by mouth daily.  . meloxicam (MOBIC) 7.5 MG tablet TAKE 1 TABLET (7.5 MG TOTAL) BY MOUTH DAILY.  [name] omeprazole (PRILOSEC) 40 MG capsule Take 1 capsule (40 mg total) by mouth daily. PATIENT NEEDS OFFICE VISIT FOR FURTHER REFILLS  . pravastatin (PRAVACHOL) 80 MG tablet  Take 1 tablet (80 mg total) by mouth daily.  . tamsulosin (FLOMAX) 0.4 MG CAPS capsule Take 1 capsule (0.4 mg total) by mouth daily.     Allergies:   Iodine   Social History   Social History  . Marital status: Married    Spouse name: N/A  . Number of children: 2  . Years of education: N/A   Occupational History  . Retired Scientist, research (medical)  .  [employer]   Social History Main Topics  . Smoking status: Former Smoker    Packs/day: 1.00    Years: 57.00    Quit date: 08/08/2015  . Smokeless tobacco: Former Systems developer    Types: Chew    Quit date: 11/04/1958  . Alcohol use 0.0 oz/week     Comment: Pt reports rare use  . Drug use: No  . Sexual activity: Not Asked   Other Topics Concern  . None   Social History Narrative  . None     Family Hx: The patient's family history includes Emphysema in his father; Learning disabilities in his son; Leukemia in his father and other; Stroke in his other.  ROS:   Please see the history of present illness.    Review of Systems  Musculoskeletal: Positive for back pain.   All other systems reviewed and are negative.   EKGs/Labs/Other Test Reviewed:    EKG:  EKG is  ordered today.  The ekg ordered today demonstrates atrial fibrillation, HR 92, normal axis, QTC 484 ms  Recent Labs: 07/22/2017: TSH 1.023 07/24/2017: Hemoglobin 13.3; Magnesium 2.1; Platelets 211 07/30/2017: ALT 21; BUN 20; Creatinine, Ser 0.81; Potassium 5.0; Sodium 138   Recent Lipid Panel Lab Results  Component Value Date/Time   CHOL 146 03/14/2017 11:00 AM   TRIG 162.0 (H) 03/14/2017 11:00 AM   HDL 49.80 03/14/2017 11:00 AM   CHOLHDL 3 03/14/2017 11:00 AM   LDLCALC 64 03/14/2017 11:00 AM   LDLDIRECT 142.2 12/26/2008 10:32 AM    Physical Exam:    VS:  BP 122/70   Pulse 92   Ht 5\' 9"  (1.753 m)   Wt 165 lb (74.8 kg)   SpO2 98%   BMI 24.37 kg/m     Wt Readings from Last 3 Encounters:  08/15/17 165 lb (74.8 kg)  07/30/17 166 lb 9.6 oz (75.6 kg)    07/23/17 167 lb 15.9 oz (76.2 kg)     Physical Exam  Constitutional: He is oriented to person, place, and time. He appears well-developed and well-nourished. No distress.  HENT:  Head: Normocephalic and atraumatic.  Neck: No JVD present.  Cardiovascular: Normal rate.  An irregularly irregular rhythm present.  No murmur heard. Pulmonary/Chest: He has no rales.  Abdominal: Soft.  Musculoskeletal: He exhibits no edema.  Neurological: He is alert and oriented to person, place, and time.  Skin: Skin is warm and  dry.  Psychiatric: He has a normal mood and affect.    ASSESSMENT:    1. PAF (paroxysmal atrial fibrillation) ([place])   2. Essential hypertension   3. COPD GOLD II     PLAN:    In order of problems listed above:  1.  PAF (paroxysmal atrial fibrillation) ([place]) He returns for post hospitalization follow-up.  He was noted to have atrial fibrillation around the time of his cholecystectomy.  He had intermittent episodes of atrial fibrillation and normal sinus rhythm.  Today, he is in atrial fibrillation.  He is unaware of this.  It is not clear to me that he is symptomatic with atrial fibrillation.  He requires long-term anticoagulation with his thromboembolic risk factor profile.  His rate is well controlled.  I have recommended that we proceed with a 48-hour Holter monitor to determine if his atrial fibrillation is constant or intermittent as well as to determine his rate control.  If his atrial fibrillation seems to be constant and he appears to have symptoms related to this, we can certainly consider proceeding with cardioversion when seen in follow-up.  Otherwise, we could certainly pursue a rate control strategy going forward.  2.  Essential hypertension The patient's blood pressure is controlled on his current regimen.  Continue current therapy.    3. COPD GOLD II  Continue follow-up with pulmonology.   Dispo:  Return in 4 weeks (on 09/15/2017) for Scheduled Follow Up, w/  Dr. Acie Fredrickson.   Medication Adjustments/Labs and Tests Ordered: Current medicines are reviewed at length with the patient today.  Concerns regarding medicines are outlined above.  Tests Ordered: Orders Placed This Encounter  Procedures  . Holter monitor - 48 hour  . EKG 12-Lead   Medication Changes: Meds ordered this encounter  Medications  . diltiazem (CARDIZEM CD) 120 MG 24 hr capsule    Sig: Take 1 capsule (120 mg total) by mouth daily.    Dispense:  90 capsule    Refill:  0  . apixaban (ELIQUIS) 5 MG TABS tablet    Sig: Take 1 tablet (5 mg total) by mouth 2 (two) times daily.    Dispense:  180 tablet    Refill:  3    Signed, Richardson Dopp, PA-C  08/15/2017 1:39 PM    [facility] [address], [place], [place]  [address] Phone: [phone]; Fax: [phone]

## 2017-08-18 MED FILL — CARTIA XT 120 MG CAPSULE SA: 120 | 90 days supply | Qty: 90 | Fill #0

## 2017-08-18 MED FILL — OMEPRAZOLE DR 40 MG CAPSULE: 40 | 30 days supply | Qty: 30 | Fill #5

## 2017-08-18 MED FILL — PRAVASTATIN SODIUM 80 MG TA: 80 | 30 days supply | Qty: 30 | Fill #5

## 2017-08-19 ENCOUNTER — Ambulatory Visit: Payer: Medicare Other | Admitting: Internal Medicine

## 2017-08-28 ENCOUNTER — Emergency Department (HOSPITAL_COMMUNITY)
Admission: EM | Admit: 2017-08-28 | Discharge: 2017-08-28 | Disposition: A | Discharge status: Home / Self Care | Payer: Medicare Other | Attending: Emergency Medicine | Admitting: Emergency Medicine

## 2017-08-28 ENCOUNTER — Encounter (HOSPITAL_COMMUNITY): Payer: Self-pay

## 2017-08-28 ENCOUNTER — Telehealth: Payer: Self-pay | Admitting: *Deleted

## 2017-08-28 ENCOUNTER — Ambulatory Visit (INDEPENDENT_AMBULATORY_CARE_PROVIDER_SITE_OTHER): Payer: Medicare Other

## 2017-08-28 ENCOUNTER — Emergency Department (HOSPITAL_COMMUNITY): Payer: Medicare Other

## 2017-08-28 DIAGNOSIS — J449 Chronic obstructive pulmonary disease, unspecified: Secondary | ICD-10-CM | POA: Diagnosis not present

## 2017-08-28 DIAGNOSIS — D329 Benign neoplasm of meninges, unspecified: Secondary | ICD-10-CM | POA: Insufficient documentation

## 2017-08-28 DIAGNOSIS — Z85118 Personal history of other malignant neoplasm of bronchus and lung: Secondary | ICD-10-CM | POA: Insufficient documentation

## 2017-08-28 DIAGNOSIS — Z79899 Other long term (current) drug therapy: Secondary | ICD-10-CM | POA: Diagnosis not present

## 2017-08-28 DIAGNOSIS — R600 Localized edema: Secondary | ICD-10-CM | POA: Diagnosis not present

## 2017-08-28 DIAGNOSIS — E119 Type 2 diabetes mellitus without complications: Secondary | ICD-10-CM | POA: Diagnosis not present

## 2017-08-28 DIAGNOSIS — I10 Essential (primary) hypertension: Secondary | ICD-10-CM | POA: Insufficient documentation

## 2017-08-28 DIAGNOSIS — I480 Paroxysmal atrial fibrillation: Secondary | ICD-10-CM

## 2017-08-28 DIAGNOSIS — Z7901 Long term (current) use of anticoagulants: Secondary | ICD-10-CM | POA: Diagnosis not present

## 2017-08-28 DIAGNOSIS — Z87891 Personal history of nicotine dependence: Secondary | ICD-10-CM | POA: Diagnosis not present

## 2017-08-28 DIAGNOSIS — R51 Headache: Secondary | ICD-10-CM | POA: Diagnosis present

## 2017-08-28 DIAGNOSIS — K4090 Unilateral inguinal hernia, without obstruction or gangrene, not specified as recurrent: Secondary | ICD-10-CM | POA: Diagnosis not present

## 2017-08-28 NOTE — Telephone Encounter (Signed)
Called patient to discuss his previous call regarding headache. I left a voice mail advising him to go to the hospital for evaluation. I advised him to call back to our office with questions or concerns.

## 2017-08-28 NOTE — ED Triage Notes (Signed)
Patient c/o frontal headache x 1 week. 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 Patient states light sensitivity. Patient states he was sent from physician's office to rule out a brain bleed. Patient also c/o right groin swelling and pain x 1 month.

## 2017-08-28 NOTE — Discharge Instructions (Signed)
Follow-up with your surgeon for the hernia.  Follow-up with your primary care doctor and neurosurgery for your meningioma.

## 2017-08-28 NOTE — ED Provider Notes (Addendum)
[facility] DEPT Provider Note   CSN: [id number] Arrival date & time: 08/28/17  1316     History   Chief Complaint Chief Complaint  Patient presents with  . Headache  . Groin Swelling    HPI Robert Bates is a 76 y.o. male.  HPI Patient reportedly sent in for headache.  Has 2 known meningiomas.  Over the last week he has had a headache behind his eyes.  States it is not really a headache just pain from his tumor.  Called his primary care doctor and was told to come in because he is on anticoagulation for his A. fib.  Sending to rule out bleed.  No vision changes.  No numbness or weakness.  The headache is dull.  States he thinks it is the tumor. He also has had some swelling in his right groin over the last month.  States it will come and go and is worse with standing up.  No nausea or vomiting.  No fevers.  No trauma.  States he had a hernia on the other side that had previously been repaired.  States it is been bothering him since he had his gallbladder removed Past Medical History:  Diagnosis Date  . Alcoholism ([place])    past problem per pt but admits to still drinking - unclear how much  . Anxiety   . Arthritis   . 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  . Diabetes type 2, controlled ([place]) 07/31/2017  . GERD (gastroesophageal reflux disease)   . Hematuria    refuses work up or referral - understands risks of morbidity / mortality - 11/2008, 12/2008  . History of hiatal hernia   . Hyperlipemia   . Kidney cyst, acquired   . Kidney stones   . Meningioma ([place]) 10/25/2013   Follows with Dr. [name].   . Peripheral vascular disease ([place])    Abdominal Aortic Aneursym  . Pneumonia    as a child  . Radiation 09/18/15-10/25/15   left lower lobe 70.2 Gy  . Tobacco abuse     Patient Active Problem List   Diagnosis Date Noted  . PAF (paroxysmal atrial  fibrillation) ([place]) 08/15/2017  . Diabetes type 2, controlled ([place]) 07/31/2017  . Cholecystitis 07/21/2017  . COPD GOLD II  04/03/2017  . 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 Neck pain 02/06/2015  . Lung cancer ([place]) 11/07/2014  . 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 Wellness examination 09/27/2014  . HTN (hypertension) 04/29/2014  . Meningioma ([place]) 10/25/2013  . Low back pain 10/25/2013  . 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 Cerumen impaction 08/18/2012  . Abnormal chest CT 08/18/2012  . KERATOSIS 10/09/2010  . SCIATICA, RIGHT 10/09/2010  . UNSPECIFIED HEARING LOSS 05/21/2010  . Hyperlipidemia 05/14/2010  . LEUKOCYTOSIS 05/14/2010  . 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[place]) 01/29/2010  . LIPOMA 11/17/2009  . MICROSCOPIC HEMATURIA 08/11/2008  . GERD 07/26/2008  . DERMOID CYST 07/25/2008    Past Surgical History:  Procedure Laterality Date  . CHOLECYSTECTOMY N/A 07/23/2017   Procedure: LAPAROSCOPIC CHOLECYSTECTOMY;  Surgeon: Kinsinger, Arta Bruce, MD;  Location: [facility];  Service: General;  Laterality: N/A;  . COLONOSCOPY    . EYE SURGERY Bilateral    Cataracts removed w/ lens implant  . HERNIA REPAIR    . TONSILLECTOMY    .  VIDEO BRONCHOSCOPY Bilateral 07/26/2015   Procedure: VIDEO BRONCHOSCOPY WITH FLUORO;  Surgeon: [name], MD;  Location: [facility];  Service: Cardiopulmonary;  Laterality: Bilateral;  . VIDEO BRONCHOSCOPY WITH ENDOBRONCHIAL NAVIGATION N/A 08/23/2015   Procedure: VIDEO BRONCHOSCOPY WITH ENDOBRONCHIAL NAVIGATION;  Surgeon: Grace Isaac, MD;  Location: [facility];  Service: Thoracic;  Laterality: N/A;  . VIDEO BRONCHOSCOPY WITH ENDOBRONCHIAL ULTRASOUND N/A 08/23/2015   Procedure: VIDEO BRONCHOSCOPY WITH ENDOBRONCHIAL ULTRASOUND;  Surgeon: Grace Isaac, MD;  Location: [facility];  Service: Thoracic;  Laterality: N/A;       Home Medications    Prior to  Admission medications   Medication Sig Start Date End Date Taking? Authorizing Provider  apixaban (ELIQUIS) 5 MG TABS tablet Take 1 tablet (5 mg total) by mouth 2 (two) times daily. 08/15/17 09/14/17 Yes Weaver, Scott T, PA-C  budesonide-formoterol (SYMBICORT) 160-4.5 MCG/ACT inhaler Inhale 2 puffs into the lungs 2 (two) times daily. 03/14/17  Yes [name], NP  diltiazem (CARDIZEM CD) 120 MG 24 hr capsule Take 1 capsule (120 mg total) by mouth daily. 08/15/17 09/14/17 Yes Weaver, Scott T, PA-C  omeprazole (PRILOSEC) 40 MG capsule Take 1 capsule (40 mg total) by mouth daily. PATIENT NEEDS OFFICE VISIT FOR FURTHER REFILLS 03/14/17  Yes [name], NP  pravastatin (PRAVACHOL) 80 MG tablet Take 1 tablet (80 mg total) by mouth daily. 03/14/17  Yes [name], NP  tamsulosin (FLOMAX) 0.4 MG CAPS capsule Take 1 capsule (0.4 mg total) by mouth daily. 07/30/17  Yes [name], NP  albuterol (PROVENTIL HFA;VENTOLIN HFA) 108 (90 Base) MCG/ACT inhaler Inhale 2 puffs into the lungs every 6 (six) hours as needed for wheezing or shortness of breath. 03/14/17   [name], NP  meloxicam (MOBIC) 7.5 MG tablet TAKE 1 TABLET (7.5 MG TOTAL) BY MOUTH DAILY. Patient not taking: Reported on 08/28/2017 06/17/17   [name], NP    Family History Family History  Problem Relation Age of Onset  . Leukemia Father   . Emphysema Father   . Learning disabilities Son   . Leukemia Other   . Stroke Other     Social History Social History  Substance Use Topics  . Smoking status: Former Smoker    Packs/day: 1.00    Years: 57.00    Quit date: 08/08/2015  . Smokeless tobacco: Former Systems developer    Types: Chew    Quit date: 11/04/1958  . Alcohol use 0.0 oz/week     Comment: Pt reports rare use     Allergies   Iodine   Review of Systems Review of Systems  Constitutional: Negative for appetite change and fever.  HENT: Negative for dental problem.   Respiratory: Negative  for shortness of breath.   Cardiovascular: Positive for leg swelling.  Gastrointestinal: Negative for abdominal pain, nausea and rectal pain.  Genitourinary: Negative for flank pain.  Musculoskeletal: Negative for back pain.  Neurological: Positive for headaches.  Hematological: Negative for adenopathy.  Psychiatric/Behavioral: Negative for confusion.     Physical Exam Updated Vital Signs BP 114/82 (BP Location: Left Arm)   Pulse 86   Temp 97.7 F (36.5 C) (Oral)   Resp 16   Ht 5\' 9"  (1.753 m)   Wt 74.8 kg (165 lb)   SpO2 97%   BMI 24.37 kg/m   Physical Exam  Constitutional: He appears well-developed.  HENT:  Head: Atraumatic.  Eyes: Pupils are equal, round, and reactive to light. EOM are normal.  Neck: Neck supple.  Cardiovascular: Normal  rate.   Pulmonary/Chest: Effort normal. He has no wheezes. He has no rales.  Abdominal: There is no tenderness. A hernia is present.  Reducible right inguinal hernia.  Musculoskeletal:  Pitting edema to bilateral lower extremities.  Neurological: He is alert.  Skin: Skin is warm. Capillary refill takes less than 2 seconds.     ED Treatments / Results  Labs (all labs ordered are listed, but only abnormal results are displayed) Labs Reviewed - No data to display  EKG  EKG Interpretation None       Radiology Ct Head Wo Contrast  Result Date: 08/28/2017 CLINICAL DATA:  Frontal headache for 1 week. Lung carcinoma. History of meningioma EXAM: CT HEAD WITHOUT CONTRAST TECHNIQUE: Contiguous axial images were obtained from the base of the skull through the vertex without intravenous contrast. COMPARISON:  Head CT July 28, 2013 and brain MRI August 15, 2015 FINDINGS: Brain: The ventricles are normal in size and configuration. There is a previously noted meningioma arising from the planum sphenoidale measuring 1.5 x 1.3 x 0.7 cm, not appreciably changed from prior MR study. A second meningioma is located at the level of the  vertex on the right which arises from the dura measuring 2.8 x 2.4 x 2.0 cm. These meningiomas do not show surrounding edema. There is mass effect on the right frontal lobe posteriorly from the larger meningioma, stable. No new masses are identified on this noncontrast enhanced study. There is no hemorrhage, extra-axial fluid collection, or midline shift. No edema is evident. The gray-white compartments appear unremarkable. No evidence of acute infarct. Vascular: No hyperdense vessels are evident. There is calcification in each carotid siphon region. Skull: Bony calvarium appears intact. Sinuses/Orbits: There is opacification in several ethmoid air cells anteriorly as well as apparent retention cysts in each frontal sinus anteriorly. Mucosal thickening is also noted in both maxillary antra, more on the right than on the left. No air-fluid levels. Orbits appear symmetric bilaterally. Other: Mastoid air cells are clear. Mastoids on the right are somewhat hypoplastic. There is debris in each external auditory canal. There is erosion along the right lateral alveolar ridge, likely of dental etiology. IMPRESSION: 1. Stable planum sphenoidale and right vertex region meningiomas without edema. Mild mass effect on the immediate right frontal lobe from the larger meningioma is stable. 2. No new mass lesions are evident on noncontrast enhanced study. No hemorrhage, extra-axial fluid collection, or midline shift. No acute infarct evident. No edema evident in the brain parenchyma. 3.  There are foci of arterial vascular calcification. 4.  Multifocal paranasal sinus disease. 5.  Probable cerumen in each external auditory canal. 6. Erosive change along the lateral right superior alveolar ridge, likely due to chronic dental disease. Electronically Signed   By: Lowella Grip III M.D.   On: 08/28/2017 16:36    Procedures Procedures (including critical care time)  Medications Ordered in ED Medications - No data to  display   Initial Impression / Assessment and Plan / ED Course  I have reviewed the triage vital signs and the nursing notes.  Pertinent labs & imaging results that were available during my care of the patient were reviewed by me and considered in my medical decision making (see chart for details).     Patient with right inguinal hernia.  Reducible.  Will need surgery follow-up.  He is on anticoagulation.  CT scan done of head.  Results pending but If does not show bleed or large amount of edema likely be able to  discharge home.  Follow-up outpatient as needed.   CT stable. D/c home   Final Clinical Impressions(s) / ED Diagnoses   Final diagnoses:  Non-recurrent unilateral inguinal hernia without obstruction or gangrene  Meningioma [facility])    New Prescriptions New Prescriptions   No medications on file     Davonna Belling, MD 08/28/17 [place]    Davonna Belling, MD 08/28/17 [phone]

## 2017-08-28 NOTE — Telephone Encounter (Signed)
Patient complaining of headache right between eyes, which, is the location of tumor.  Did not hurt until he started Eliquis.  Wants to know if Eliquis could be causing this.

## 2017-08-28 NOTE — Telephone Encounter (Signed)
Spoke with patient who states he has had an intermittent headache for several days. He states he has 2 brain tumors. I advised him that with his hx of anticoagulant therapy he will need to go to the ED for evaluation of his headache. He states he will go there now. He thanked me for the call.

## 2017-08-28 NOTE — Telephone Encounter (Signed)
Agree with the plan for Robert Bates to go to the hospital. He had been on Eliquis and now has a new head ache.

## 2017-08-29 ENCOUNTER — Telehealth: Payer: Self-pay | Admitting: Family

## 2017-08-29 DIAGNOSIS — K4090 Unilateral inguinal hernia, without obstruction or gangrene, not specified as recurrent: Secondary | ICD-10-CM

## 2017-08-29 NOTE — Telephone Encounter (Signed)
See my chart message

## 2017-09-01 ENCOUNTER — Telehealth: Payer: Self-pay | Admitting: Family

## 2017-09-01 NOTE — Telephone Encounter (Signed)
Dr Lorelei Pont-- please see below request and advise in PCP's absence?  Pt called and scheduled ER f/u for 09/03/17.  He states that his employer told him he cannot return to work after today until he is given a letter of medical clearance to return to work due to his inguinal hernia. Will need to include any restrictions in letter as well. Pt advised this may have to wait until PCP returns to the office on Wednesday if covering Provider is unable to provide letter and he voices understanding.

## 2017-09-01 NOTE — Telephone Encounter (Signed)
Called him back- he has a hernia and needs a work excuse.  He is seeing Melissa on Wednesday.  Will write him a note for the rest of this week- he and Melissa can discuss the next step at their visit  His wife is able to print the note off his mychart acct so I will send it there

## 2017-09-03 ENCOUNTER — Ambulatory Visit: Payer: Medicare Other | Admitting: Family

## 2017-09-03 DIAGNOSIS — Z0289 Encounter for other administrative examinations: Secondary | ICD-10-CM

## 2017-09-15 ENCOUNTER — Ambulatory Visit: Payer: Medicare Other | Admitting: Cardiovascular Disease

## 2017-09-16 ENCOUNTER — Other Ambulatory Visit: Payer: Self-pay | Admitting: Family

## 2017-09-16 ENCOUNTER — Ambulatory Visit: Payer: Medicare Other | Admitting: Cardiovascular Disease

## 2017-09-16 ENCOUNTER — Encounter: Payer: Self-pay | Admitting: Cardiovascular Disease

## 2017-09-16 VITALS — BP 104/70 | HR 68 | Ht 69.0 in | Wt 168.4 lb

## 2017-09-16 DIAGNOSIS — I480 Paroxysmal atrial fibrillation: Secondary | ICD-10-CM | POA: Diagnosis not present

## 2017-09-16 MED FILL — SYMBICORT 160-4.5 MCG INH: 160-4.5 | 30 days supply | Qty: 10 | Fill #2

## 2017-09-16 MED FILL — ELIQUIS 5 MG TABLET: 5 | 30 days supply | Qty: 60 | Fill #1

## 2017-09-16 NOTE — Patient Instructions (Signed)

## 2017-09-16 NOTE — Progress Notes (Signed)
Cardiology Office Note:    Date:  09/16/2017   ID:  Robert Bates, DOB 06/06/1941, MRN [id number]  PCP:  [name], NP  Cardiologist:  Mertie Moores, MD    Referring MD: [name], NP   Chief Complaint  Patient presents with  . Atrial Fibrillation    History of Present Illness:    Robert Bates is a 76 y.o. male with a hx of  PAF. He has documented PAF on monitor Cannot tell that he is in AF  I met him in the hospital  He has a history of tobacco abuse, alcoholism, lung cancer, COPD, peripheral vascular disease, abdominal aortic aneurysm, hypertension    Past Medical History:  Diagnosis Date  . Alcoholism ([place])    past problem per pt but admits to still drinking - unclear how much  . Anxiety   . Arthritis   . 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  . Diabetes type 2, controlled ([place]) 07/31/2017  . GERD (gastroesophageal reflux disease)   . Hematuria    refuses work up or referral - understands risks of morbidity / mortality - 11/2008, 12/2008  . History of hiatal hernia   . Hyperlipemia   . Kidney cyst, acquired   . Kidney stones   . Meningioma ([place]) 10/25/2013   Follows with Dr. [name].   . Peripheral vascular disease ([place])    Abdominal Aortic Aneursym  . Pneumonia    as a child  . Radiation 09/18/15-10/25/15   left lower lobe 70.2 Gy  . Tobacco abuse     Past Surgical History:  Procedure Laterality Date  . COLONOSCOPY    . EYE SURGERY Bilateral    Cataracts removed w/ lens implant  . HERNIA REPAIR    . TONSILLECTOMY      Current Medications: Current Meds  Medication Sig  . albuterol (PROVENTIL HFA;VENTOLIN HFA) 108 (90 Base) MCG/ACT inhaler Inhale 2 puffs into the lungs every 6 (six) hours as needed for wheezing or shortness of breath.  [name] apixaban (ELIQUIS) 5 MG TABS tablet Take 1 tablet (5 mg total) by mouth 2 (two) times daily.  .  budesonide-formoterol (SYMBICORT) 160-4.5 MCG/ACT inhaler Inhale 2 puffs into the lungs 2 (two) times daily.  [name] diltiazem (CARDIZEM CD) 120 MG 24 hr capsule Take 1 capsule (120 mg total) by mouth daily.  [name] omeprazole (PRILOSEC) 40 MG capsule Take 1 capsule (40 mg total) by mouth daily. PATIENT NEEDS OFFICE VISIT FOR FURTHER REFILLS  . pravastatin (PRAVACHOL) 80 MG tablet Take 1 tablet (80 mg total) by mouth daily.     Allergies:   Iodine   Social History   Socioeconomic History  . Marital status: Married    Spouse name: None  . Number of children: 2  . Years of education: None  . Highest education level: None  Social Needs  . Financial resource strain: None  . Food insecurity - worry: None  . Food insecurity - inability: None  . Transportation needs - medical: None  . Transportation needs - non-medical: None  Occupational History  . Occupation: Retired    Fish farm manager: [employer]    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[employer]  . Smoking status: Former Smoker    Packs/day: 1.00    Years: 57.00    Pack years: 57.00    Last attempt to quit: 08/08/2015    Years  since quitting: 2.1  . Smokeless tobacco: Former Systems developer    Types: [place]  date: 11/04/1958  Substance and Sexual Activity  . Alcohol use: Yes    Alcohol/week: 0.0 oz    Comment: Pt reports rare use  . Drug use: No  . Sexual activity: None  Other Topics Concern  . None  Social History Narrative  . None     Family History: The patient's family history includes Emphysema in his father; Learning disabilities in his son; Leukemia in his father and other; Stroke in his other. ROS:   Please see the history of present illness.     All other systems reviewed and are negative.  EKGs/Labs/Other Studies Reviewed:    The following studies were reviewed today:   EKG:  EKG is not  ordered today.     Recent Labs: 07/22/2017: TSH 1.023 07/24/2017: Hemoglobin 13.3; Magnesium 2.1; Platelets 211 07/30/2017:  ALT 21; BUN 20; Creatinine, Ser 0.81; Potassium 5.0; Sodium 138  Recent Lipid Panel    Component Value Date/Time   CHOL 146 03/14/2017 1100   TRIG 162.0 (H) 03/14/2017 1100   HDL 49.80 03/14/2017 1100   CHOLHDL 3 03/14/2017 1100   VLDL 32.4 03/14/2017 1100   LDLCALC 64 03/14/2017 1100   LDLDIRECT 142.2 12/26/2008 1032    Physical Exam:    VS:  BP 104/70 (BP Location: Left Arm, Patient Position: Sitting, Cuff Size: Normal)   Pulse 68   Ht '5\' 9"'$  (1.753 m)   Wt 168 lb 6.4 oz (76.4 kg)   BMI 24.87 kg/m     Wt Readings from Last 3 Encounters:  09/16/17 168 lb 6.4 oz (76.4 kg)  08/28/17 165 lb (74.8 kg)  08/15/17 165 lb (74.8 kg)     GEN:  Well nourished, well developed in no acute distress HEENT: Normal NECK: No JVD; No carotid bruits LYMPHATICS: No lymphadenopathy CARDIAC: RR, no murmurs, rubs, gallops RESPIRATORY:  Bilateral wheezing  ABDOMEN: Soft, non-tender, non-distended MUSCULOSKELETAL:  No edema; No deformity  SKIN: Warm and dry NEUROLOGIC:  Alert and oriented x 3 PSYCHIATRIC:  Normal affect   ASSESSMENT:    No diagnosis found. PLAN:    In order of problems listed above:  1. Paroxysmal atrial fibrillation: Rudell presents today with history of paroxysmal atrial for ablation.  He cannot tell when he is in A. fib or in normal sinus rhythm.  He overall is doing well.  Echocardiogram in September reveals normal left ventricular systolic function.  He does have grade 2 diastolic dysfunction.  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  He is at low risk for his upcoming hernia surgery.  We will give him permission to hold the Eliquis for 2 days prior to surgery.  He will restart Eliquis at the discretion of the surgeon.  I will see him in 6 months.  Medication Adjustments/Labs and Tests Ordered: Current medicines are reviewed at length with the patient today.  Concerns regarding medicines are outlined above.  No orders of the  defined types were placed in this encounter.  No orders of the defined types were placed in this encounter.   Signed, Mertie Moores, MD  09/16/2017 10:31 AM    [place]

## 2017-09-17 ENCOUNTER — Ambulatory Visit: Payer: Self-pay | Admitting: General Surgery

## 2017-09-17 MED FILL — PRAVASTATIN SODIUM 80 MG TA: 80 | 30 days supply | Qty: 30 | Fill #0

## 2017-09-17 MED FILL — OMEPRAZOLE DR 40 MG CAPSULE: 40 | 30 days supply | Qty: 30 | Fill #0

## 2017-09-22 ENCOUNTER — Ambulatory Visit: Payer: Medicare Other | Admitting: Cardiovascular Disease

## 2017-09-23 ENCOUNTER — Ambulatory Visit (INDEPENDENT_AMBULATORY_CARE_PROVIDER_SITE_OTHER): Payer: Medicare Other | Admitting: Family

## 2017-09-23 ENCOUNTER — Encounter: Payer: Self-pay | Admitting: Family

## 2017-09-23 VITALS — BP 111/67 | HR 68 | Temp 97.5°F | Resp 16 | Ht 68.0 in | Wt 168.0 lb

## 2017-09-23 DIAGNOSIS — Z0000 Encounter for general adult medical examination without abnormal findings: Secondary | ICD-10-CM

## 2017-09-23 DIAGNOSIS — E119 Type 2 diabetes mellitus without complications: Secondary | ICD-10-CM

## 2017-09-23 MED ORDER — ZOSTER VAC RECOMB ADJUVANTED 50 MCG/0.5ML IM SUSR: 0.5000 mL | Freq: Once | INTRAMUSCULAR | 1 refills | Status: AC

## 2017-09-23 NOTE — Patient Instructions (Signed)
Lyriq Jarchow  09/23/2017   Your procedure is scheduled on: 10-01-17  Report to [facility] Main  Entrance Take [place]  elevators to 3rd floor to  [place] at     [phone].   Call this number if you have problems the morning of surgery [phone]    Remember: ONLY 1 PERSON MAY GO WITH YOU TO SHORT STAY TO GET  READY MORNING OF [place].  Do not eat food or drink liquids :After Midnight.     Take these medicines the morning of surgery with A SIP OF WATER: omeprazole, dilitiazem, inhalers and bring                                You may not have any metal on your body including hair pins and              piercings  Do not wear jewelry, , lotions, powders or perfumes, deodorant                       Men may shave face and neck.   Do not bring valuables to the hospital. [place].  Contacts, dentures or bridgework may not be worn into surgery.      Patients discharged the day of surgery will not be allowed to drive home.  Name and phone number of your driver:  Special Instructions: N/A              Please read over the following fact sheets you were given: _____________________________________________________________________          [facility] - Preparing for Surgery Before surgery, you can play an important role.  Because skin is not sterile, your skin needs to be as free of germs as possible.  You can reduce the number of germs on your skin by washing with CHG (chlorahexidine gluconate) soap before surgery.  CHG is an antiseptic cleaner which kills germs and bonds with the skin to continue killing germs even after washing. Please DO NOT use if you have an allergy to CHG or antibacterial soaps.  If your skin becomes reddened/irritated stop using the CHG and inform your nurse when you arrive at Short Stay. Do not shave (including legs and underarms) for at least 48 hours prior to the first CHG  shower.  You may shave your face/neck. Please follow these instructions carefully:  1.  Shower with CHG Soap the night before surgery and the  morning of Surgery.  2.  If you choose to wash your hair, wash your hair first as usual with your  normal  shampoo.  3.  After you shampoo, rinse your hair and body thoroughly to remove the  shampoo.                           4.  Use CHG as you would any other liquid soap.  You can apply chg directly  to the skin and wash                       Gently with a scrungie or clean washcloth.  5.  Apply the CHG Soap to your  body ONLY FROM THE NECK DOWN.   Do not use on face/ open                           Wound or open sores. Avoid contact with eyes, ears mouth and genitals (private parts).                       Wash face,  Genitals (private parts) with your normal soap.             6.  Wash thoroughly, paying special attention to the area where your surgery  will be performed.  7.  Thoroughly rinse your body with warm water from the neck down.  8.  DO NOT shower/wash with your normal soap after using and rinsing off  the CHG Soap.                9.  Pat yourself dry with a clean towel.            10.  Wear clean pajamas.            11.  Place clean sheets on your bed the night of your first shower and do not  sleep with pets. Day of Surgery : Do not apply any lotions/deodorants the morning of surgery.  Please wear clean clothes to the hospital/surgery center.  FAILURE TO FOLLOW THESE INSTRUCTIONS MAY RESULT IN THE CANCELLATION OF YOUR SURGERY PATIENT SIGNATURE_________________________________  NURSE SIGNATURE__________________________________  ________________________________________________________________________

## 2017-09-23 NOTE — Progress Notes (Signed)
[facility] cardiology 09-16-17 clearance epic   Echo 07-22-17 epic  EKG 08-15-17 epic   hgba1c 07-30-17 6.5 epic

## 2017-09-23 NOTE — Progress Notes (Addendum)
Subjective:    Patient ID: Robert Bates, male    DOB: October 10, 1941, 76 y.o.   MRN: [id number]  HPI  Patient presents today for complete physical.  Immunizations: 9/09 td.   Diet: needs work per wife Exercise: no  Colonoscopy: 12/08/09 Vision:  due  Lab Results  Component Value Date   CHOL 146 03/14/2017   HDL 49.80 03/14/2017   LDLCALC 64 03/14/2017   LDLDIRECT 142.2 12/26/2008   TRIG 162.0 (H) 03/14/2017   CHOLHDL 3 03/14/2017     :Review of Systems  Constitutional: Negative for unexpected weight change.  HENT: Negative for hearing loss and rhinorrhea.   Eyes: Negative for visual disturbance.  Respiratory: Negative for cough.   Gastrointestinal: Negative for constipation and diarrhea.  Genitourinary: Negative for dysuria and hematuria.  Musculoskeletal: Positive for neck pain.  Skin: Negative for rash.  Neurological:       Does report some HA's  Hematological: Negative for adenopathy.  Psychiatric/Behavioral:       Denies depression/anxiety       Past Medical History:  Diagnosis Date  . Alcoholism ([place])    past problem per pt but admits to still drinking - unclear how much  . Anxiety   . Arthritis   . 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  . Diabetes type 2, controlled ([place]) 07/31/2017  . GERD (gastroesophageal reflux disease)   . Hematuria    refuses work up or referral - understands risks of morbidity / mortality - 11/2008, 12/2008  . History of hiatal hernia   . Hyperlipemia   . Kidney cyst, acquired   . Kidney stones   . Meningioma ([place]) 10/25/2013   Follows with Dr. [name].   . Peripheral vascular disease ([place])    Abdominal Aortic Aneursym  . Pneumonia    as a child  . Radiation 09/18/15-10/25/15   left lower lobe 70.2 Gy  . Tobacco abuse      Social History   Socioeconomic History  . Marital status: Married    Spouse name: Not on file  . Number of  children: 2  . Years of education: Not on file  . Highest education level: Not on file  Social Needs  . Financial resource strain: Not on file  . Food insecurity - worry: Not on file  . Food insecurity - inability: Not on file  . Transportation needs - medical: Not on file  . Transportation needs - non-medical: Not on file  Occupational History  . 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 Smoking status: Former Smoker    Packs/day: 1.00    Years: 57.00    Pack years: 57.00    Last attempt to quit: 08/08/2015    Years since quitting: 2.1  . Smokeless tobacco: Former Systems developer    Types: [place] date: 11/04/1958  Substance and Sexual Activity  . Alcohol use: Yes    Alcohol/week: 0.0 oz    Comment: Pt reports rare use  . Drug use: No  . Sexual activity: Not on file  Other Topics Concern  . Not on file  Social History Narrative  . Not on file    Past Surgical History:  Procedure Laterality Date  . COLONOSCOPY    . EYE SURGERY Bilateral    Cataracts removed  w/ lens implant  . HERNIA REPAIR    . LAPAROSCOPIC CHOLECYSTECTOMY N/A 07/23/2017   Performed by Kinsinger, Arta Bruce, MD at [facility] ORS  . TONSILLECTOMY    . VIDEO BRONCHOSCOPY WITH ENDOBRONCHIAL NAVIGATION N/A 08/23/2015   Performed by Grace Isaac, MD at [place]  . VIDEO BRONCHOSCOPY WITH ENDOBRONCHIAL ULTRASOUND N/A 08/23/2015   Performed by Grace Isaac, MD at [place]  . VIDEO BRONCHOSCOPY WITH FLUORO Bilateral 07/26/2015   Performed by [name], MD at [facility] ENDOSCOPY    Family History  Problem Relation Age of Onset  . Leukemia Father   . Emphysema Father   . Learning disabilities Son   . Leukemia Other   . Stroke Other     Allergies  Allergen Reactions  . Iodine Other (See Comments)    neck swells    Current Outpatient Medications on File Prior to Visit  Medication Sig Dispense Refill  . albuterol (PROVENTIL HFA;VENTOLIN  HFA) 108 (90 Base) MCG/ACT inhaler Inhale 2 puffs into the lungs every 6 (six) hours as needed for wheezing or shortness of breath. 1 Inhaler 1  . apixaban (ELIQUIS) 5 MG TABS tablet Take 1 tablet (5 mg total) by mouth 2 (two) times daily. 180 tablet 3  . budesonide-formoterol (SYMBICORT) 160-4.5 MCG/ACT inhaler Inhale 2 puffs into the lungs 2 (two) times daily. (Patient taking differently: Inhale 2 puffs daily into the lungs. ) 1 Inhaler 3  . diltiazem (CARDIZEM CD) 120 MG 24 hr capsule Take 1 capsule (120 mg total) by mouth daily. 90 capsule 0  . omeprazole (PRILOSEC) 40 MG capsule TAKE 1 CAPSULE (40 MG TOTAL) BY MOUTH DAILY. PATIENT NEEDS OFFICE VISIT FOR FURTHER REFILLS 30 capsule 5  . pravastatin (PRAVACHOL) 80 MG tablet TAKE 1 TABLET (80 MG TOTAL) BY MOUTH DAILY. (Patient taking differently: Take 80 mg at bedtime by mouth. ) 30 tablet 5   No current facility-administered medications on file prior to visit.     BP 111/67 (BP Location: Right Arm, Cuff Size: Normal)   Pulse 68   Temp (!) 97.5 F (36.4 C) (Oral)   Resp 16   Ht 5\' 8"  (1.727 m)   Wt 168 lb (76.2 kg)   SpO2 98%   BMI 25.54 kg/m    Objective:   Physical Exam Physical Exam  Constitutional: He is oriented to person, place, and time. He appears well-developed and well-nourished. No distress.  HENT:  Head: Normocephalic and atraumatic.  Right Ear: Tympanic membrane and ear canal normal.  Left Ear: Tympanic membrane and ear canal normal.  Mouth/Throat: Oropharynx is clear and moist.  Eyes: Pupils are equal, round, and reactive to light. No scleral icterus.  Neck: Normal range of motion. No thyromegaly present.  Cardiovascular: Normal rate and regular rhythm.   No murmur heard. Pulmonary/Chest: Effort normal and breath sounds normal. No respiratory distress. He has  wheezes. He has no rales. He exhibits no tenderness.  Abdominal: Soft. Bowel sounds are normal. He exhibits no distension and no mass. There is no tenderness.  There is no rebound and no guarding.  Musculoskeletal: He exhibits no edema.  Lymphadenopathy:    He has no cervical adenopathy.  Neurological: He is alert and oriented to person, place, and time. He has normal patellar reflexes. He exhibits normal muscle tone. Coordination normal.  Skin: Skin is warm and dry.  Psychiatric: He has a normal mood and affect. His behavior is normal. Judgment and thought content normal.  Assessment & Plan:   Preventative care- discussed diabetic diet and exercise.  He is advised to restart symbicort due to wheezing. Refer for colo and DM eye exam. Due for shingrix, will send rx to pharmacy. They decline Td today.     Assessment & Plan:

## 2017-09-23 NOTE — Addendum Note (Signed)
Addended by: [name] on: 09/23/2017 08:09 AM   Modules accepted: Level of Service

## 2017-09-23 NOTE — Patient Instructions (Signed)
Please work on healthy diabetic diet. You will be contacted about your referral to GI for colonoscopy and for your eye exam. Try to add regular walking with goal of 30 minutes 5 days a week.

## 2017-09-30 ENCOUNTER — Encounter (HOSPITAL_COMMUNITY)
Admission: RE | Admit: 2017-09-30 | Discharge: 2017-09-30 | Disposition: A | Discharge status: Home / Self Care | Payer: Medicare Other | Source: Ambulatory Visit | Attending: General Surgery | Admitting: General Surgery

## 2017-09-30 ENCOUNTER — Other Ambulatory Visit: Payer: Self-pay

## 2017-09-30 ENCOUNTER — Encounter (HOSPITAL_COMMUNITY): Payer: Self-pay

## 2017-09-30 DIAGNOSIS — E785 Hyperlipidemia, unspecified: Secondary | ICD-10-CM | POA: Diagnosis not present

## 2017-09-30 DIAGNOSIS — Z7901 Long term (current) use of anticoagulants: Secondary | ICD-10-CM | POA: Diagnosis not present

## 2017-09-30 DIAGNOSIS — Z923 Personal history of irradiation: Secondary | ICD-10-CM | POA: Diagnosis not present

## 2017-09-30 DIAGNOSIS — Z85118 Personal history of other malignant neoplasm of bronchus and lung: Secondary | ICD-10-CM | POA: Diagnosis not present

## 2017-09-30 DIAGNOSIS — F1020 Alcohol dependence, uncomplicated: Secondary | ICD-10-CM | POA: Diagnosis not present

## 2017-09-30 DIAGNOSIS — Z87442 Personal history of urinary calculi: Secondary | ICD-10-CM | POA: Diagnosis not present

## 2017-09-30 DIAGNOSIS — D176 Benign lipomatous neoplasm of spermatic cord: Secondary | ICD-10-CM | POA: Diagnosis not present

## 2017-09-30 DIAGNOSIS — F419 Anxiety disorder, unspecified: Secondary | ICD-10-CM | POA: Diagnosis not present

## 2017-09-30 DIAGNOSIS — Z836 Family history of other diseases of the respiratory system: Secondary | ICD-10-CM | POA: Diagnosis not present

## 2017-09-30 DIAGNOSIS — K219 Gastro-esophageal reflux disease without esophagitis: Secondary | ICD-10-CM | POA: Diagnosis not present

## 2017-09-30 DIAGNOSIS — Z9842 Cataract extraction status, left eye: Secondary | ICD-10-CM | POA: Diagnosis not present

## 2017-09-30 DIAGNOSIS — Z9889 Other specified postprocedural states: Secondary | ICD-10-CM | POA: Diagnosis not present

## 2017-09-30 DIAGNOSIS — Z7951 Long term (current) use of inhaled steroids: Secondary | ICD-10-CM | POA: Diagnosis not present

## 2017-09-30 DIAGNOSIS — Z9841 Cataract extraction status, right eye: Secondary | ICD-10-CM | POA: Diagnosis not present

## 2017-09-30 DIAGNOSIS — Z961 Presence of intraocular lens: Secondary | ICD-10-CM | POA: Diagnosis not present

## 2017-09-30 DIAGNOSIS — Z823 Family history of stroke: Secondary | ICD-10-CM | POA: Diagnosis not present

## 2017-09-30 DIAGNOSIS — Z87891 Personal history of nicotine dependence: Secondary | ICD-10-CM | POA: Diagnosis not present

## 2017-09-30 DIAGNOSIS — E1151 Type 2 diabetes mellitus with diabetic peripheral angiopathy without gangrene: Secondary | ICD-10-CM | POA: Diagnosis not present

## 2017-09-30 DIAGNOSIS — K4090 Unilateral inguinal hernia, without obstruction or gangrene, not specified as recurrent: Secondary | ICD-10-CM | POA: Diagnosis not present

## 2017-09-30 DIAGNOSIS — I4891 Unspecified atrial fibrillation: Secondary | ICD-10-CM | POA: Diagnosis not present

## 2017-09-30 DIAGNOSIS — Z79899 Other long term (current) drug therapy: Secondary | ICD-10-CM | POA: Diagnosis not present

## 2017-09-30 DIAGNOSIS — Z91041 Radiographic dye allergy status: Secondary | ICD-10-CM | POA: Diagnosis not present

## 2017-09-30 DIAGNOSIS — J449 Chronic obstructive pulmonary disease, unspecified: Secondary | ICD-10-CM | POA: Diagnosis not present

## 2017-09-30 DIAGNOSIS — Z9049 Acquired absence of other specified parts of digestive tract: Secondary | ICD-10-CM | POA: Diagnosis not present

## 2017-09-30 HISTORY — DX: Chronic obstructive pulmonary disease, unspecified: J44.9

## 2017-09-30 HISTORY — DX: Personal history of urinary calculi: Z87.442

## 2017-09-30 HISTORY — DX: Cardiac arrhythmia, unspecified: I49.9

## 2017-09-30 LAB — CBC WITH DIFFERENTIAL/PLATELET
Basophils Absolute: 0 10*3/uL (ref 0.0–0.1)
Basophils Relative: 0 %
Eosinophils Absolute: 0.3 10*3/uL (ref 0.0–0.7)
Eosinophils Relative: 5 %
HCT: 41.6 % (ref 39.0–52.0)
Hemoglobin: 13.7 g/dL (ref 13.0–17.0)
Lymphocytes Relative: 20 %
Lymphs Abs: 1.4 10*3/uL (ref 0.7–4.0)
MCH: 30.7 pg (ref 26.0–34.0)
MCHC: 32.9 g/dL (ref 30.0–36.0)
MCV: 93.3 fL (ref 78.0–100.0)
Monocytes Absolute: 0.5 10*3/uL (ref 0.1–1.0)
Monocytes Relative: 8 %
Neutro Abs: 4.5 10*3/uL (ref 1.7–7.7)
Neutrophils Relative %: 67 %
Platelets: 235 10*3/uL (ref 150–400)
RBC: 4.46 MIL/uL (ref 4.22–5.81)
RDW: 14.1 % (ref 11.5–15.5)
WBC: 6.7 10*3/uL (ref 4.0–10.5)

## 2017-09-30 LAB — BASIC METABOLIC PANEL
Anion gap: 5 (ref 5–15)
BUN: 15 mg/dL (ref 6–20)
CO2: 29 mmol/L (ref 22–32)
Calcium: 9.4 mg/dL (ref 8.9–10.3)
Chloride: 104 mmol/L (ref 101–111)
Creatinine, Ser: 0.78 mg/dL (ref 0.61–1.24)
GFR calc Af Amer: >60 mL/min (ref >60)
GFR calc non Af Amer: >60 mL/min (ref >60)
Glucose, Bld: 102 mg/dL — ABNORMAL HIGH (ref 65–99)
Potassium: 4.3 mmol/L (ref 3.5–5.1)
Sodium: 138 mmol/L (ref 135–145)

## 2017-09-30 LAB — HEMOGLOBIN A1C
Hgb A1c MFr Bld: 5.9 % — ABNORMAL HIGH (ref 4.8–5.6)
Mean Plasma Glucose: 122.63 mg/dL

## 2017-09-30 LAB — GLUCOSE, CAPILLARY: Glucose-Capillary: 101 mg/dL — ABNORMAL HIGH (ref 65–99)

## 2017-09-30 LAB — PROTIME-INR
INR: 1.01
Prothrombin Time: 13.2 seconds (ref 11.4–15.2)

## 2017-10-01 ENCOUNTER — Ambulatory Visit (HOSPITAL_COMMUNITY)
Admission: RE | Admit: 2017-10-01 | Discharge: 2017-10-01 | Disposition: A | Discharge status: Home / Self Care | Payer: Medicare Other | Source: Ambulatory Visit | Attending: General Surgery | Admitting: General Surgery

## 2017-10-01 ENCOUNTER — Ambulatory Visit (HOSPITAL_COMMUNITY): Payer: Medicare Other | Admitting: Anesthesiology

## 2017-10-01 ENCOUNTER — Encounter (HOSPITAL_COMMUNITY): Payer: Self-pay | Admitting: *Deleted

## 2017-10-01 ENCOUNTER — Other Ambulatory Visit: Payer: Self-pay

## 2017-10-01 ENCOUNTER — Encounter (HOSPITAL_COMMUNITY)
Admission: RE | Disposition: A | Discharge status: Home / Self Care | Payer: Self-pay | Source: Ambulatory Visit | Attending: General Surgery

## 2017-10-01 DIAGNOSIS — Z9842 Cataract extraction status, left eye: Secondary | ICD-10-CM | POA: Insufficient documentation

## 2017-10-01 DIAGNOSIS — J449 Chronic obstructive pulmonary disease, unspecified: Secondary | ICD-10-CM | POA: Insufficient documentation

## 2017-10-01 DIAGNOSIS — Z923 Personal history of irradiation: Secondary | ICD-10-CM | POA: Insufficient documentation

## 2017-10-01 DIAGNOSIS — D176 Benign lipomatous neoplasm of spermatic cord: Secondary | ICD-10-CM | POA: Diagnosis not present

## 2017-10-01 DIAGNOSIS — Z79899 Other long term (current) drug therapy: Secondary | ICD-10-CM | POA: Diagnosis not present

## 2017-10-01 DIAGNOSIS — Z91041 Radiographic dye allergy status: Secondary | ICD-10-CM | POA: Insufficient documentation

## 2017-10-01 DIAGNOSIS — E785 Hyperlipidemia, unspecified: Secondary | ICD-10-CM | POA: Insufficient documentation

## 2017-10-01 DIAGNOSIS — Z9049 Acquired absence of other specified parts of digestive tract: Secondary | ICD-10-CM | POA: Insufficient documentation

## 2017-10-01 DIAGNOSIS — Z85118 Personal history of other malignant neoplasm of bronchus and lung: Secondary | ICD-10-CM | POA: Insufficient documentation

## 2017-10-01 DIAGNOSIS — K219 Gastro-esophageal reflux disease without esophagitis: Secondary | ICD-10-CM | POA: Insufficient documentation

## 2017-10-01 DIAGNOSIS — Z7901 Long term (current) use of anticoagulants: Secondary | ICD-10-CM | POA: Insufficient documentation

## 2017-10-01 DIAGNOSIS — K4090 Unilateral inguinal hernia, without obstruction or gangrene, not specified as recurrent: Secondary | ICD-10-CM | POA: Insufficient documentation

## 2017-10-01 DIAGNOSIS — Z9889 Other specified postprocedural states: Secondary | ICD-10-CM | POA: Insufficient documentation

## 2017-10-01 DIAGNOSIS — Z87891 Personal history of nicotine dependence: Secondary | ICD-10-CM | POA: Insufficient documentation

## 2017-10-01 DIAGNOSIS — F1020 Alcohol dependence, uncomplicated: Secondary | ICD-10-CM | POA: Insufficient documentation

## 2017-10-01 DIAGNOSIS — Z9841 Cataract extraction status, right eye: Secondary | ICD-10-CM | POA: Insufficient documentation

## 2017-10-01 DIAGNOSIS — Z87442 Personal history of urinary calculi: Secondary | ICD-10-CM | POA: Insufficient documentation

## 2017-10-01 DIAGNOSIS — E1151 Type 2 diabetes mellitus with diabetic peripheral angiopathy without gangrene: Secondary | ICD-10-CM | POA: Insufficient documentation

## 2017-10-01 DIAGNOSIS — Z7951 Long term (current) use of inhaled steroids: Secondary | ICD-10-CM | POA: Insufficient documentation

## 2017-10-01 DIAGNOSIS — Z823 Family history of stroke: Secondary | ICD-10-CM | POA: Insufficient documentation

## 2017-10-01 DIAGNOSIS — F419 Anxiety disorder, unspecified: Secondary | ICD-10-CM | POA: Insufficient documentation

## 2017-10-01 DIAGNOSIS — Z961 Presence of intraocular lens: Secondary | ICD-10-CM | POA: Insufficient documentation

## 2017-10-01 DIAGNOSIS — Z806 Family history of leukemia: Secondary | ICD-10-CM | POA: Insufficient documentation

## 2017-10-01 DIAGNOSIS — Z810 Family history of intellectual disabilities: Secondary | ICD-10-CM | POA: Insufficient documentation

## 2017-10-01 DIAGNOSIS — I4891 Unspecified atrial fibrillation: Secondary | ICD-10-CM | POA: Insufficient documentation

## 2017-10-01 DIAGNOSIS — Z836 Family history of other diseases of the respiratory system: Secondary | ICD-10-CM | POA: Insufficient documentation

## 2017-10-01 HISTORY — PX: INGUINAL HERNIA REPAIR: SHX194

## 2017-10-01 HISTORY — PX: INSERTION OF MESH: SHX5868

## 2017-10-01 LAB — GLUCOSE, CAPILLARY
Glucose-Capillary: 105 mg/dL — ABNORMAL HIGH (ref 65–99)
Glucose-Capillary: 137 mg/dL — ABNORMAL HIGH (ref 65–99)

## 2017-10-01 SURGERY — REPAIR, HERNIA, INGUINAL, ADULT
Anesthesia: General | Site: Groin | Laterality: Right

## 2017-10-01 MED ORDER — FENTANYL CITRATE (PF) 100 MCG/2ML IJ SOLN
INTRAMUSCULAR | Status: DC | PRN
  Administered 2017-10-01 (×4): 25 ug via INTRAVENOUS
  Administered 2017-10-01 (×2): 50 ug via INTRAVENOUS

## 2017-10-01 MED ORDER — DEXAMETHASONE SODIUM PHOSPHATE 4 MG/ML IJ SOLN
INTRAMUSCULAR | Status: DC | PRN
  Administered 2017-10-01: 10 mg via INTRAVENOUS

## 2017-10-01 MED ORDER — BUPIVACAINE-EPINEPHRINE 0.25% -1:200000 IJ SOLN
INTRAMUSCULAR | Status: DC | PRN
  Administered 2017-10-01: 30 mL

## 2017-10-01 MED ORDER — BUPIVACAINE-EPINEPHRINE (PF) 0.25% -1:200000 IJ SOLN
INTRAMUSCULAR | Status: AC
  Filled 2017-10-01: qty 30

## 2017-10-01 MED ORDER — PROPOFOL 10 MG/ML IV BOLUS
INTRAVENOUS | Status: DC | PRN
  Administered 2017-10-01: 100 mg via INTRAVENOUS

## 2017-10-01 MED ORDER — MIDAZOLAM HCL 5 MG/5ML IJ SOLN
INTRAMUSCULAR | Status: DC | PRN
  Administered 2017-10-01 (×2): 1 mg via INTRAVENOUS

## 2017-10-01 MED ORDER — EPHEDRINE 5 MG/ML INJ
INTRAVENOUS | Status: AC
  Filled 2017-10-01: qty 10

## 2017-10-01 MED ORDER — LIDOCAINE 2% (20 MG/ML) 5 ML SYRINGE
INTRAMUSCULAR | Status: AC
  Filled 2017-10-01: qty 5

## 2017-10-01 MED ORDER — ACETAMINOPHEN 500 MG PO TABS
1000.0000 mg | ORAL_TABLET | ORAL | Status: AC
  Administered 2017-10-01: 1000 mg via ORAL
  Filled 2017-10-01: qty 2

## 2017-10-01 MED ORDER — CHLORHEXIDINE GLUCONATE CLOTH 2 % EX PADS: 6.0000 | MEDICATED_PAD | Freq: Once | CUTANEOUS | Status: DC

## 2017-10-01 MED ORDER — HYDROCODONE-ACETAMINOPHEN 5-325 MG PO TABS: 1.0000 | ORAL_TABLET | Freq: Four times a day (QID) | ORAL | 0 refills | Status: DC | PRN

## 2017-10-01 MED ORDER — HYDROMORPHONE HCL 1 MG/ML IJ SOLN
0.2500 mg | INTRAMUSCULAR | Status: DC | PRN
  Administered 2017-10-01 (×2): 0.5 mg via INTRAVENOUS

## 2017-10-01 MED ORDER — EPHEDRINE SULFATE 50 MG/ML IJ SOLN
INTRAMUSCULAR | Status: DC | PRN
  Administered 2017-10-01: 10 mg via INTRAVENOUS
  Administered 2017-10-01 (×2): 5 mg via INTRAVENOUS
  Administered 2017-10-01: 10 mg via INTRAVENOUS

## 2017-10-01 MED ORDER — PROMETHAZINE HCL 25 MG/ML IJ SOLN: 6.2500 mg | INTRAMUSCULAR | Status: DC | PRN

## 2017-10-01 MED ORDER — 0.9 % SODIUM CHLORIDE (POUR BTL) OPTIME
TOPICAL | Status: DC | PRN
  Administered 2017-10-01: 1000 mL

## 2017-10-01 MED ORDER — CEFAZOLIN SODIUM-DEXTROSE 2-4 GM/100ML-% IV SOLN
2.0000 g | INTRAVENOUS | Status: AC
  Administered 2017-10-01: 2 g via INTRAVENOUS
  Filled 2017-10-01: qty 100

## 2017-10-01 MED ORDER — GABAPENTIN 300 MG PO CAPS
300.0000 mg | ORAL_CAPSULE | ORAL | Status: AC
  Administered 2017-10-01: 300 mg via ORAL
  Filled 2017-10-01: qty 1

## 2017-10-01 MED ORDER — ONDANSETRON HCL 4 MG/2ML IJ SOLN
INTRAMUSCULAR | Status: DC | PRN
  Administered 2017-10-01: 4 mg via INTRAVENOUS

## 2017-10-01 MED ORDER — PROPOFOL 10 MG/ML IV BOLUS
INTRAVENOUS | Status: AC
  Filled 2017-10-01: qty 20

## 2017-10-01 MED ORDER — LIDOCAINE HCL (CARDIAC) 20 MG/ML IV SOLN
INTRAVENOUS | Status: DC | PRN
  Administered 2017-10-01: 100 mg via INTRAVENOUS

## 2017-10-01 MED ORDER — MIDAZOLAM HCL 2 MG/2ML IJ SOLN
INTRAMUSCULAR | Status: AC
  Filled 2017-10-01: qty 2

## 2017-10-01 MED ORDER — DEXAMETHASONE SODIUM PHOSPHATE 10 MG/ML IJ SOLN
INTRAMUSCULAR | Status: AC
  Filled 2017-10-01: qty 1

## 2017-10-01 MED ORDER — FENTANYL CITRATE (PF) 250 MCG/5ML IJ SOLN
INTRAMUSCULAR | Status: AC
  Filled 2017-10-01: qty 5

## 2017-10-01 MED ORDER — ONDANSETRON HCL 4 MG/2ML IJ SOLN
INTRAMUSCULAR | Status: AC
  Filled 2017-10-01: qty 2

## 2017-10-01 MED ORDER — LACTATED RINGERS IV SOLN
INTRAVENOUS | Status: DC
  Administered 2017-10-01: 07:00:00 via INTRAVENOUS

## 2017-10-01 MED ORDER — HYDROMORPHONE HCL 1 MG/ML IJ SOLN
INTRAMUSCULAR | Status: AC
  Filled 2017-10-01: qty 1

## 2017-10-01 MED FILL — HYDROCODON-APAP 5-325: 5-325 | 8 days supply | Qty: 30 | Fill #0

## 2017-10-01 SURGICAL SUPPLY — 41 items
BENZOIN TINCTURE PRP APPL 2/3 (GAUZE/BANDAGES/DRESSINGS) ×3 IMPLANT
BLADE SURG 15 STRL LF DISP TIS (BLADE) ×1 IMPLANT
BLADE SURG 15 STRL SS (BLADE) ×2
CELLS DAT CNTRL 66122 CELL SVR (MISCELLANEOUS) IMPLANT
CHLORAPREP W/TINT 26ML (MISCELLANEOUS) ×3 IMPLANT
CLOSURE WOUND 1/2 X4 (GAUZE/BANDAGES/DRESSINGS) ×1
COVER SURGICAL LIGHT HANDLE (MISCELLANEOUS) ×3 IMPLANT
DECANTER SPIKE VIAL GLASS SM (MISCELLANEOUS) ×3 IMPLANT
DRAIN PENROSE 18X1/2 LTX STRL (DRAIN) IMPLANT
DRAPE LAPAROTOMY TRNSV 102X78 (DRAPE) ×3 IMPLANT
DRSG TEGADERM 4X4.75 (GAUZE/BANDAGES/DRESSINGS) ×3 IMPLANT
ELECT PENCIL ROCKER SW 15FT (MISCELLANEOUS) ×3 IMPLANT
ELECT REM PT RETURN 15FT ADLT (MISCELLANEOUS) ×3 IMPLANT
GAUZE SPONGE 4X4 12PLY STRL (GAUZE/BANDAGES/DRESSINGS) ×3 IMPLANT
GLOVE BIOGEL PI IND STRL 7.0 (GLOVE) ×1 IMPLANT
GLOVE BIOGEL PI INDICATOR 7.0 (GLOVE) ×2
GLOVE SURG SS PI 7.0 STRL IVOR (GLOVE) ×3 IMPLANT
GOWN STRL REUS W/TWL LRG LVL3 (GOWN DISPOSABLE) ×3 IMPLANT
GOWN STRL REUS W/TWL XL LVL3 (GOWN DISPOSABLE) ×3 IMPLANT
KIT BASIN OR (CUSTOM PROCEDURE TRAY) ×3 IMPLANT
MESH BARD SOFT 3X6IN (Mesh General) ×3 IMPLANT
NEEDLE HYPO 22GX1.5 SAFETY (NEEDLE) ×3 IMPLANT
PACK BASIC VI WITH GOWN DISP (CUSTOM PROCEDURE TRAY) ×3 IMPLANT
RETRACTOR WND ALEXIS 25 LRG (MISCELLANEOUS) IMPLANT
RTRCTR WOUND ALEXIS 18CM MED (MISCELLANEOUS)
RTRCTR WOUND ALEXIS 25CM LRG (MISCELLANEOUS)
SPONGE LAP 18X18 X RAY DECT (DISPOSABLE) IMPLANT
SPONGE LAP 4X18 X RAY DECT (DISPOSABLE) ×3 IMPLANT
STRIP CLOSURE SKIN 1/2X4 (GAUZE/BANDAGES/DRESSINGS) ×2 IMPLANT
SUT MNCRL AB 4-0 PS2 18 (SUTURE) ×3 IMPLANT
SUT PROLENE 2 0 CT2 30 (SUTURE) ×6 IMPLANT
SUT SILK 2 0 SH CR/8 (SUTURE) IMPLANT
SUT VIC AB 2-0 CT1 27 (SUTURE) ×2
SUT VIC AB 2-0 CT1 TAPERPNT 27 (SUTURE) ×1 IMPLANT
SUT VIC AB 3-0 SH 27 (SUTURE) ×2
SUT VIC AB 3-0 SH 27XBRD (SUTURE) ×1 IMPLANT
SYR BULB IRRIGATION 50ML (SYRINGE) ×3 IMPLANT
SYR CONTROL 10ML LL (SYRINGE) ×3 IMPLANT
TOWEL OR 17X26 10 PK STRL BLUE (TOWEL DISPOSABLE) ×3 IMPLANT
TOWEL OR NON WOVEN STRL DISP B (DISPOSABLE) ×3 IMPLANT
YANKAUER SUCT BULB TIP 10FT TU (MISCELLANEOUS) ×3 IMPLANT

## 2017-10-01 NOTE — Anesthesia Procedure Notes (Signed)
Anesthesia Regional Block: TAP block   Pre-Anesthetic Checklist: ,, timeout performed, Correct Patient, Correct Site, Correct Laterality, Correct Procedure, Correct Position, site marked, Risks and benefits discussed,  Surgical consent,  Pre-op evaluation,  At surgeon's request and post-op pain management  Laterality: Right  Prep: chloraprep       Needles:  Injection technique: Single-shot  Needle Type: Echogenic Needle     Needle Length: 9cm      Additional Needles:   Procedures:,,,, ultrasound used (permanent image in chart),,,,  Narrative:  Start time: 10/01/2017 8:15 AM End time: 10/01/2017 8:25 AM Injection made incrementally with aspirations every 5 mL.  Performed by: Personally  Anesthesiologist: Myrtie Soman, MD  Additional Notes: Patient tolerated the procedure well without complications

## 2017-10-01 NOTE — Anesthesia Procedure Notes (Signed)
Procedure Name: LMA Insertion Date/Time: 10/01/2017 8:37 AM Performed by: Deliah [place], CRNA Pre-anesthesia Checklist: Patient identified, Emergency Drugs available, Suction available and Patient being monitored Patient Re-evaluated:Patient Re-evaluated prior to induction Oxygen Delivery Method: Circle system utilized Preoxygenation: Pre-oxygenation with 100% oxygen Induction Type: IV induction Ventilation: Mask ventilation without difficulty Tube size: 5.0 mm Number of attempts: 1 Placement Confirmation: positive ETCO2 and breath sounds checked- equal and bilateral Tube secured with: Tape Dental Injury: Teeth and Oropharynx as per pre-operative assessment

## 2017-10-01 NOTE — Op Note (Signed)
Preop diagnosis: right inguinal hernia  Postop diagnosis: right direct inguinal hernia  Procedure: open Right inguinal hernia repair with mesh  Surgeon: [name], M.D.  Asst: none  Anesthesia: Gen.   Indications for procedure: Robert Bates is a 76 y.o. male with symptoms of pain and enlarging Right inguinal hernia(s). After discussing risks, alternatives and benefits he decided on open repair and was brought to day surgery for repair. He underwent preoperative US guided TAP block on the right side  Description of procedure: The patient was brought into the operative suite, placed supine. Anesthesia was administered with endotracheal tube. Patient was strapped in place. The patient was prepped and draped in the usual sterile fashion.  The anterior superior iliac spine and pubic tubercle were identified on the Right side. An incision was made 1cm above the connecting line, representative of the location of the inguinal ligament. The subcutaneous tissue was bluntly dissected, scarpa's fascia was dissected away. The external abdominal oblique fascia was identified and sharply opened down to the external inguinal ring. The conjoint tendon and inguinal ligament were identified. The cord structures and sac were dissected free of the surrounding tissue in 360 degrees. A penrose drain was used to encircle the contents. The cremasteric fibers were dissected free of the contents of the cord and hernia sac. The cord structures (vessels and vas deferens) were identified and carefully dissected away from the hernia sac. There was a lipoma of the cord and small hernia sac. The hernia sac was dissected down to the internal inguinal ring. Preperitoneal fat was identified showing appropriate dissection. The lipoma was excised and a 0 vicryl used to tie the base. The sac was then reduced into the preperitoneal space. The direct space appeared to be the area of larger defect. A 0 vicryl was used in interrupted  fashion to appose the conjoint tendon to the inguinal ligament to recreate the floor. A 3x6 Bard Soft mesh was then used to close the defect and reinforce the floor. The mesh was sutured to the lacunar ligament and inguinal ligament using a 2-0 prolene in running fashion. Next the superior edge of the mesh was sutured to the conjoined tendon using a 2-0 running Prolene. An additional 2-0 Prolene was used to suture the tail ends of the mesh together re-creating the deep ring. Cord structures are running in a neutral position through the mesh. Next the external abdominal oblique fascia was closed with a 2-0 Vicryl in running fashion to re-create the external inguinal ring. Scarpa's fascia was closed with 3-0 Vicryl in running fashion. Skin was closed with a 4-0 Monocryl subcuticular stitch in running fashion. Dermabond place for dressing. Patient woke from anesthesia and brought to PACU in stable condition. All counts are correct.    Findings: right direct with small indirect inguinal hernia  Specimen: none  Blood loss: <30 ml  Local anesthesia: 61ml 0.5% Marcaine mix  Complications: none  Implant: 3x6in bard soft mesh  [name], M.D. General, Bariatric, & Minimally Invasive Surgery [facility], [place] 9:49 AM 10/01/2017

## 2017-10-01 NOTE — Anesthesia Procedure Notes (Signed)
Anesthesia Procedure Image    

## 2017-10-01 NOTE — H&P (Signed)
Robert Bates is an 76 y.o. male.   Chief Complaint: right inguinal hernia HPI: 76 yo male with history of right inguinal hernia. It causes daily pain. He presents for repair.  Past Medical History:  Diagnosis Date  . Alcoholism ([place])    past problem per pt but admits to still drinking - unclear how much  . Anxiety   . Arthritis   . 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  [name] COPD (chronic obstructive pulmonary disease) ([place])   . Diabetes type 2, controlled ([place]) 07/31/2017  . Dysrhythmia    a fib  . GERD (gastroesophageal reflux disease)   . Hematuria    refuses work up or referral - understands risks of morbidity / mortality - 11/2008, 12/2008  . History of hiatal hernia   . History of kidney stones   . Hyperlipemia   . Meningioma ([place]) 10/25/2013   Follows with Dr. [name].   . Peripheral vascular disease ([place])    Abdominal Aortic Aneursym  . Pneumonia    as a child  . Radiation 09/18/15-10/25/15   left lower lobe 70.2 Gy  . Tobacco abuse     Past Surgical History:  Procedure Laterality Date  . CHOLECYSTECTOMY N/A 07/23/2017   Procedure: LAPAROSCOPIC CHOLECYSTECTOMY;  Surgeon: Tayden Nichelson, Arta Bruce, MD;  Location: [facility] ORS;  Service: General;  Laterality: N/A;  . COLONOSCOPY    . EYE SURGERY Bilateral    Cataracts removed w/ lens implant  . HERNIA REPAIR     Left 36 years ago . Right inguinal hernia repair 10-01-17 Dr. Kieth Brightly  . TONSILLECTOMY    . TONSILLECTOMY    . VIDEO BRONCHOSCOPY Bilateral 07/26/2015   Procedure: VIDEO BRONCHOSCOPY WITH FLUORO;  Surgeon: [name], MD;  Location: [facility] ENDOSCOPY;  Service: Cardiopulmonary;  Laterality: Bilateral;  . VIDEO BRONCHOSCOPY WITH ENDOBRONCHIAL NAVIGATION N/A 08/23/2015   Procedure: VIDEO BRONCHOSCOPY WITH ENDOBRONCHIAL NAVIGATION;  Surgeon: Grace Isaac, MD;  Location: [place];  Service: Thoracic;  Laterality: N/A;  . VIDEO  BRONCHOSCOPY WITH ENDOBRONCHIAL ULTRASOUND N/A 08/23/2015   Procedure: VIDEO BRONCHOSCOPY WITH ENDOBRONCHIAL ULTRASOUND;  Surgeon: Grace Isaac, MD;  Location: [facility] OR;  Service: Thoracic;  Laterality: N/A;    Family History  Problem Relation Age of Onset  . Leukemia Father   . Emphysema Father   . Learning disabilities Son   . Leukemia Other   . Stroke Other    Social History:  reports that he quit smoking about 2 years ago. He has a 57.00 pack-year smoking history. He quit smokeless tobacco use about 58 years ago. His smokeless tobacco use included chew. He reports that he drinks alcohol. He reports that he does not use drugs.  Allergies:  Allergies  Allergen Reactions  . Iodine Other (See Comments)    neck swells    Medications Prior to Admission  Medication Sig Dispense Refill  . albuterol (PROVENTIL HFA;VENTOLIN HFA) 108 (90 Base) MCG/ACT inhaler Inhale 2 puffs into the lungs every 6 (six) hours as needed for wheezing or shortness of breath. 1 Inhaler 1  . apixaban (ELIQUIS) 5 MG TABS tablet Take 1 tablet (5 mg total) by mouth 2 (two) times daily. 180 tablet 3  . budesonide-formoterol (SYMBICORT) 160-4.5 MCG/ACT inhaler Inhale 2 puffs into the lungs 2 (two) times daily. (Patient taking differently: Inhale 2 puffs daily into the lungs. ) 1 Inhaler 3  . diltiazem (CARDIZEM  CD) 120 MG 24 hr capsule Take 1 capsule (120 mg total) by mouth daily. 90 capsule 0  . omeprazole (PRILOSEC) 40 MG capsule TAKE 1 CAPSULE (40 MG TOTAL) BY MOUTH DAILY. PATIENT NEEDS OFFICE VISIT FOR FURTHER REFILLS 30 capsule 5  . pravastatin (PRAVACHOL) 80 MG tablet TAKE 1 TABLET (80 MG TOTAL) BY MOUTH DAILY. (Patient taking differently: Take 80 mg at bedtime by mouth. ) 30 tablet 5    Results for orders placed or performed during the hospital encounter of 10/01/17 (from the past 48 hour(s))  Glucose, capillary     Status: Abnormal   Collection Time: 10/01/17  6:42 AM  Result Value Ref Range    Glucose-Capillary 105 (H) 65 - 99 mg/dL   No results found.  Review of Systems  Constitutional: Negative for chills and fever.  HENT: Negative for hearing loss.   Eyes: Negative for blurred vision and double vision.  Respiratory: Negative for cough and hemoptysis.   Cardiovascular: Negative for chest pain and palpitations.  Gastrointestinal: Positive for abdominal pain. Negative for nausea and vomiting.  Genitourinary: Negative for dysuria and urgency.  Musculoskeletal: Negative for myalgias and neck pain.  Skin: Negative for itching and rash.  Neurological: Negative for dizziness, tingling and headaches.  Endo/Heme/Allergies: Does not bruise/bleed easily.  Psychiatric/Behavioral: Negative for depression and suicidal ideas.    Blood pressure 132/77, pulse 60, temperature (!) 97.4 F (36.3 C), temperature source Oral, resp. rate 18, height 5\' 8"  (1.727 m), weight 74.8 kg (165 lb), SpO2 100 %. Physical Exam  Vitals reviewed. Constitutional: He is oriented to person, place, and time. He appears well-developed and well-nourished.  HENT:  Head: Normocephalic and atraumatic.  Eyes: Conjunctivae and EOM are normal. Pupils are equal, round, and reactive to light.  Neck: Normal range of motion. Neck supple.  Cardiovascular: Normal rate and regular rhythm.  Respiratory: Effort normal and breath sounds normal.  GI: Soft. Bowel sounds are normal. He exhibits no distension. There is no tenderness.  Moderate right inguinal hernia  Musculoskeletal: Normal range of motion.  Neurological: He is alert and oriented to person, place, and time.  Skin: Skin is warm and dry.  Psychiatric: He has a normal mood and affect. His behavior is normal.     Assessment/Plan 76 yo male with symptomatic right inguinal hernia -right open inguinal hernia repair with mesh -we discussed that smoking increasing his risk for recurrence as well as infection as well as mesh related complications. He acknowledges this  and wants to proceed.  Mickeal Skinner, MD 10/01/2017, 8:11 AM

## 2017-10-01 NOTE — Addendum Note (Signed)
Addendum  created 10/01/17 1234 by Deliah [place], CRNA   Charge Capture section accepted

## 2017-10-01 NOTE — Anesthesia Preprocedure Evaluation (Signed)
Anesthesia Evaluation  Patient identified by MRN, date of birth, ID band Patient awake    Reviewed: Allergy & Precautions, NPO status , Patient's Chart, lab work & pertinent test results  Airway Mallampati: II  TM Distance: >3 FB Neck ROM: Full    Dental no notable dental hx.    Pulmonary COPD, former smoker,    breath sounds clear to auscultation + decreased breath sounds      Cardiovascular hypertension, + Peripheral Vascular Disease  Normal cardiovascular exam Rhythm:Regular Rate:Normal     Neuro/Psych negative neurological ROS  negative psych ROS   GI/Hepatic GERD  ,(+)     substance abuse  alcohol use,   Endo/Other  negative endocrine ROSdiabetes  Renal/GU negative Renal ROS  negative genitourinary   Musculoskeletal negative musculoskeletal ROS (+)   Abdominal   Peds negative pediatric ROS (+)  Hematology negative hematology ROS (+)   Anesthesia Other Findings   Reproductive/Obstetrics negative OB ROS                             Anesthesia Physical Anesthesia Plan  ASA: III  Anesthesia Plan: General   Post-op Pain Management:  Regional for Post-op pain   Induction: Intravenous  PONV Risk Score and Plan: 2 and Ondansetron and Dexamethasone  Airway Management Planned: Oral ETT and LMA  Additional Equipment:   Intra-op Plan:   Post-operative Plan: Extubation in OR  Informed Consent: I have reviewed the patients History and Physical, chart, labs and discussed the procedure including the risks, benefits and alternatives for the proposed anesthesia with the patient or authorized representative who has indicated his/her understanding and acceptance.   Dental advisory given  Plan Discussed with: CRNA and Surgeon  Anesthesia Plan Comments:         Anesthesia Quick Evaluation

## 2017-10-01 NOTE — Transfer of Care (Signed)
Immediate Anesthesia Transfer of Care Note  Patient: [place]  Procedure(s) Performed: Procedure(s) with comments: RIGHT INGUINAL HERNIA REPAIR WITH MESH (Right) - TAP BLOCK INSERTION OF MESH (Right)  Patient Location: PACU  Anesthesia Type:General  Level of Consciousness: Patient easily awoken, sedated, comfortable, cooperative, following commands, responds to stimulation.   Airway & Oxygen Therapy: Patient spontaneously breathing, ventilating well, oxygen via simple oxygen mask.  Post-op Assessment: Report given to PACU RN, vital signs reviewed and stable, moving all extremities.   Post vital signs: Reviewed and stable.  Complications: No apparent anesthesia complications  Last Vitals:  Vitals:   10/01/17 0825 10/01/17 0826  BP:  105/66  Pulse: (!) 54 (!) 54  Resp: (!) 9 10  Temp:    SpO2: 94% 93%    Last Pain:  Vitals:   10/01/17 0640  TempSrc:   PainSc: 2       Patients Stated Pain Goal: 4 (16/58/00 6349)  Complications: No apparent anesthesia complications

## 2017-10-01 NOTE — Discharge Instructions (Signed)
Open Hernia Repair, Adult, Care After These instructions give you information about caring for yourself after your procedure. Your doctor may also give you more specific instructions. If you have problems or questions, contact your doctor. Follow these instructions at home: Surgical cut (incision) care   Follow instructions from your doctor about how to take care of your surgical cut area. Make sure you: ? Wash your hands with soap and water before you change your bandage (dressing). If you cannot use soap and water, use hand sanitizer. ? Change your bandage as told by your doctor. ? Leave stitches (sutures), skin glue, or skin tape (adhesive) strips in place. They may need to stay in place for 2 weeks or longer. If tape strips get loose and curl up, you may trim the loose edges. Do not remove tape strips completely unless your doctor says it is okay.  Check your surgical cut every day for signs of infection. Check for: ? More redness, swelling, or pain. ? More fluid or blood. ? Warmth. ? Pus or a bad smell. Activity  Do not drive or use heavy machinery while taking prescription pain medicine. Do not drive until your doctor says it is okay.  Until your doctor says it is okay: ? Do not lift anything that is heavier than 10 lb (4.5 kg). ? Do not play contact sports.  Return to your normal activities as told by your doctor. Ask your doctor what activities are safe. General instructions  To prevent or treat having a hard time pooping (constipation) while you are taking prescription pain medicine, your doctor may recommend that you: ? Drink enough fluid to keep your pee (urine) clear or pale yellow. ? Take over-the-counter or prescription medicines. ? Eat foods that are high in fiber, such as fresh fruits and vegetables, whole grains, and beans. ? Limit foods that are high in fat and processed sugars, such as fried and sweet foods.  Take over-the-counter and prescription medicines only as  told by your doctor.  Do not take baths, swim, or use a hot tub until your doctor says it is okay.  Keep all follow-up visits as told by your doctor. This is important. Contact a doctor if:  You develop a rash.  You have more redness, swelling, or pain around your surgical cut.  You have more fluid or blood coming from your surgical cut.  Your surgical cut feels warm to the touch.  You have pus or a bad smell coming from your surgical cut.  You have a fever or chills.  You have blood in your poop (stool).  You have not pooped in 2-3 days.  Medicine does not help your pain. Get help right away if:  You have chest pain or you are short of breath.  You feel light-headed.  You feel weak and dizzy (feel faint).  You have very bad pain.  You throw up (vomit) and your pain is worse. This information is not intended to replace advice given to you by your health care provider. Make sure you discuss any questions you have with your health care provider. Document Released: 11/11/2014 Document Revised: 05/10/2016 Document Reviewed: 04/03/2016 Elsevier Interactive Patient Education  2017 [employer] Anesthesia, Adult, Care After These instructions provide you with information about caring for yourself after your procedure. Your health care provider may also give you more specific instructions. Your treatment has been planned according to current medical practices, but problems sometimes occur. Call your health care provider  if you have any problems or questions after your procedure. What can I expect after the procedure? After the procedure, it is common to have:  Vomiting.  A sore throat.  Mental slowness.  It is common to feel:  Nauseous.  Cold or shivery.  Sleepy.  Tired.  Sore or achy, even in parts of your body where you did not have surgery.  Follow these instructions at home: For at least 24 hours after the procedure:  Do not: ? Participate in  activities where you could fall or become injured. ? Drive. ? Use heavy machinery. ? Drink alcohol. ? Take sleeping pills or medicines that cause drowsiness. ? Make important decisions or sign legal documents. ? Take care of children on your own.  Rest. Eating and drinking  If you vomit, drink water, juice, or soup when you can drink without vomiting.  Drink enough fluid to keep your urine clear or pale yellow.  Make sure you have little or no nausea before eating solid foods.  Follow the diet recommended by your health care provider. General instructions  Have a responsible adult stay with you until you are awake and alert.  Return to your normal activities as told by your health care provider. Ask your health care provider what activities are safe for you.  Take over-the-counter and prescription medicines only as told by your health care provider.  If you smoke, do not smoke without supervision.  Keep all follow-up visits as told by your health care provider. This is important. Contact a health care provider if:  You continue to have nausea or vomiting at home, and medicines are not helpful.  You cannot drink fluids or start eating again.  You cannot urinate after 8-12 hours.  You develop a skin rash.  You have fever.  You have increasing redness at the site of your procedure. Get help right away if:  You have difficulty breathing.  You have chest pain.  You have unexpected bleeding.  You feel that you are having a life-threatening or urgent problem. This information is not intended to replace advice given to you by your health care provider. Make sure you discuss any questions you have with your health care provider. Document Released: 01/27/2001 Document Revised: 03/25/2016 Document Reviewed: 10/05/2015 Elsevier Interactive Patient Education  [employer].

## 2017-10-01 NOTE — Anesthesia Postprocedure Evaluation (Signed)
Anesthesia Post Note  Patient: Robert Bates  Procedure(s) Performed: RIGHT INGUINAL HERNIA REPAIR WITH MESH (Right Groin) INSERTION OF MESH (Right Groin)     Patient location during evaluation: PACU Anesthesia Type: General Level of consciousness: awake and alert Pain management: pain level controlled Vital Signs Assessment: post-procedure vital signs reviewed and stable Respiratory status: spontaneous breathing, nonlabored ventilation, respiratory function stable and patient connected to nasal cannula oxygen Cardiovascular status: blood pressure returned to baseline and stable Postop Assessment: no apparent nausea or vomiting Anesthetic complications: no    Last Vitals:  Vitals:   10/01/17 1045 10/01/17 1055  BP: 117/79   Pulse: 65 64  Resp: 12 15  Temp: 36.7 C (!) 36.3 C  SpO2: 94% 92%    Last Pain:  Vitals:   10/01/17 1015  TempSrc:   PainSc: 3                  Banjamin Stovall S

## 2017-10-21 ENCOUNTER — Other Ambulatory Visit: Payer: Self-pay | Admitting: Physician Assistant

## 2017-10-21 MED ORDER — DILTIAZEM HCL ER COATED BEADS 120 MG PO CP24: 120.0000 mg | ORAL_CAPSULE | Freq: Every day | ORAL | 3 refills | Status: DC

## 2017-10-21 MED FILL — ELIQUIS 5 MG TABLET: 5 | 30 days supply | Qty: 60 | Fill #2

## 2017-10-21 MED FILL — OMEPRAZOLE DR 40 MG CAPSULE: 40 | 30 days supply | Qty: 30 | Fill #1

## 2017-10-21 MED FILL — PRAVASTATIN SODIUM 80 MG TA: 80 | 30 days supply | Qty: 30 | Fill #1

## 2017-10-21 NOTE — Addendum Note (Signed)
Addended by: Derl Barrow on: 10/21/2017 04:35 PM   Modules accepted: Orders

## 2017-11-05 ENCOUNTER — Ambulatory Visit: Payer: Medicare Other | Admitting: Family

## 2017-11-19 MED FILL — PRAVASTATIN SODIUM 80 MG TA: 80 | 30 days supply | Qty: 30 | Fill #2

## 2017-11-19 MED FILL — OMEPRAZOLE DR 40 MG CAPSULE: 40 | 30 days supply | Qty: 30 | Fill #2

## 2017-11-19 MED FILL — ELIQUIS 5 MG TABLET: 5 | 30 days supply | Qty: 60 | Fill #3

## 2017-11-19 MED FILL — CARTIA XT 120 MG CAPSULE SA: 120 | 90 days supply | Qty: 90 | Fill #0

## 2017-12-12 MED FILL — SYMBICORT 160-4.5 MCG INH: 160-4.5 | 30 days supply | Qty: 10 | Fill #3

## 2017-12-22 ENCOUNTER — Ambulatory Visit: Payer: Medicare Other | Admitting: Family

## 2017-12-22 MED FILL — OMEPRAZOLE DR 40 MG CAPSULE: 40 | 30 days supply | Qty: 30 | Fill #3

## 2017-12-22 MED FILL — PRAVASTATIN SODIUM 80 MG TA: 80 | 30 days supply | Qty: 30 | Fill #3

## 2017-12-22 MED FILL — ELIQUIS 5 MG TABLET: 5 | 30 days supply | Qty: 60 | Fill #4

## 2017-12-24 ENCOUNTER — Ambulatory Visit: Payer: Medicare Other | Admitting: Family

## 2017-12-25 ENCOUNTER — Encounter: Payer: Self-pay | Admitting: Family

## 2017-12-25 ENCOUNTER — Ambulatory Visit (INDEPENDENT_AMBULATORY_CARE_PROVIDER_SITE_OTHER): Payer: Medicare Other | Admitting: Family

## 2017-12-25 ENCOUNTER — Telehealth: Payer: Self-pay | Admitting: Family

## 2017-12-25 VITALS — BP 131/73 | HR 72 | Temp 98.3°F | Resp 18 | Ht 68.0 in | Wt 174.4 lb

## 2017-12-25 DIAGNOSIS — I480 Paroxysmal atrial fibrillation: Secondary | ICD-10-CM

## 2017-12-25 DIAGNOSIS — E1142 Type 2 diabetes mellitus with diabetic polyneuropathy: Secondary | ICD-10-CM

## 2017-12-25 DIAGNOSIS — J449 Chronic obstructive pulmonary disease, unspecified: Secondary | ICD-10-CM

## 2017-12-25 DIAGNOSIS — I714 Abdominal aortic aneurysm, without rupture, unspecified: Secondary | ICD-10-CM

## 2017-12-25 DIAGNOSIS — J029 Acute pharyngitis, unspecified: Secondary | ICD-10-CM

## 2017-12-25 DIAGNOSIS — E785 Hyperlipidemia, unspecified: Secondary | ICD-10-CM

## 2017-12-25 DIAGNOSIS — C3402 Malignant neoplasm of left main bronchus: Secondary | ICD-10-CM | POA: Diagnosis not present

## 2017-12-25 LAB — COMPREHENSIVE METABOLIC PANEL
ALT: 8 U/L (ref 0–53)
AST: 10 U/L (ref 0–37)
Albumin: 3.4 g/dL — ABNORMAL LOW (ref 3.5–5.2)
Alkaline Phosphatase: 72 U/L (ref 39–117)
BUN: 10 mg/dL (ref 6–23)
CO2: 31 mEq/L (ref 19–32)
Calcium: 9.3 mg/dL (ref 8.4–10.5)
Chloride: 103 mEq/L (ref 96–112)
Creatinine, Ser: 0.67 mg/dL (ref 0.40–1.50)
GFR: 122.41 mL/min (ref >60.00)
Glucose, Bld: 124 mg/dL — ABNORMAL HIGH (ref 70–99)
Potassium: 3.5 mEq/L (ref 3.5–5.1)
Sodium: 140 mEq/L (ref 135–145)
Total Bilirubin: 0.3 mg/dL (ref 0.2–1.2)
Total Protein: 6.2 g/dL (ref 6.0–8.3)

## 2017-12-25 LAB — LIPID PANEL
Cholesterol: 119 mg/dL (ref 0–200)
HDL: 52.8 mg/dL (ref >39.00)
LDL Cholesterol: 46 mg/dL (ref 0–99)
NonHDL: 65.96
Total CHOL/HDL Ratio: 2
Triglycerides: 100 mg/dL (ref 0.0–149.0)
VLDL: 20 mg/dL (ref 0.0–40.0)

## 2017-12-25 LAB — POCT RAPID STREP A (OFFICE): Rapid Strep A Screen: NEGATIVE

## 2017-12-25 LAB — HEMOGLOBIN A1C: Hgb A1c MFr Bld: 6.1 % (ref 4.6–6.5)

## 2017-12-25 NOTE — Progress Notes (Signed)
Subjective:    Patient ID: Robert Bates, male    DOB: 10/23/1941, 77 y.o.   MRN: [id number]  HPI  Patient is a 77 yr old male who presents today for follow up.  1) Hyperlipidemia-patient is maintained on Pravachol 80 mg once daily. Reports fair diet.    Lab Results  Component Value Date   CHOL 146 03/14/2017   HDL 49.80 03/14/2017   LDLCALC 64 03/14/2017   LDLDIRECT 142.2 12/26/2008   TRIG 162.0 (H) 03/14/2017   CHOLHDL 3 03/14/2017   2) GERD- He is maintained on omeprazole 20 mg once daily. Reports symptoms are well controlled on PPI.    3) Meningiomas- stable on CT 10/18.   4) Lung CA- followed by Dr. Julien Nordmann. Stage IIB (T3, N0, M0) non-small cell lung cancer, squamous cell carcinoma presented with left lower lobe endobronchial lesion as well as suspicious groundglass opacity in the left upper lobe diagnosed in September 2018. He is status post radiotherapy with Dr. Sondra Come. He is scheduled for follow up in 2 weeks.   5) HTN-  BP Readings from Last 3 Encounters:  12/25/17 131/73  10/01/17 115/67  09/30/17 116/67   6) PAF-he continues diltiazem CD 120 mg daily as well as Eliquis.  Both of these are being prescribed by cardiology.  7) DM2-  Lab Results  Component Value Date   HGBA1C 5.9 (H) 09/30/2017   HGBA1C 6.5 07/30/2017   Lab Results  Component Value Date   LDLCALC 64 03/14/2017   CREATININE 0.78 09/30/2017   8) COPD- maintained on symbicort and prn albuterol. He does report chronic cough.    He reports sore throat for several days.   Review of Systems    see HPI  Past Medical History:  Diagnosis Date  . Alcoholism ([place])    past problem per pt but admits to still drinking - unclear how much  . Anxiety   . Arthritis   . Cancer ([place]) 2016   lung- 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  [name] COPD (chronic obstructive pulmonary disease) ([place])   . Diabetes type 2, controlled ([place]) 07/31/2017  .  Dysrhythmia    a fib  . GERD (gastroesophageal reflux disease)   . Hematuria    refuses work up or referral - understands risks of morbidity / mortality - 11/2008, 12/2008  . History of hiatal hernia   . History of kidney stones   . Hyperlipemia   . Meningioma ([place]) 10/25/2013   Follows with Dr. [name].   . Peripheral vascular disease ([place])    Abdominal Aortic Aneursym  . Pneumonia    as a child  . Radiation 09/18/15-10/25/15   left lower lobe 70.2 Gy  . Tobacco abuse      Social History   Socioeconomic History  . Marital status: Married    Spouse name: Not on file  . Number of children: 2  . Years of education: Not on file  . Highest education level: Not on file  Social Needs  . Financial resource strain: Not on file  . Food insecurity - worry: Not on file  . Food insecurity - inability: Not on file  . Transportation needs - medical: Not on file  . Transportation needs - non-medical: Not on file  Occupational History  . Occupation: Retired    Fish farm manager: [employer]    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[employer]  .  Smoking status: Former Smoker    Packs/day: 1.00    Years: 57.00    Pack years: 57.00    Last attempt to quit: 08/08/2015    Years since quitting: 2.3  . Smokeless tobacco: Former Systems developer    Types: Chew    Quit date: 11/04/1958  . Tobacco comment: occasional use sneaks around  Substance and Sexual Activity  . Alcohol use: Yes    Alcohol/week: 0.0 oz    Frequency: Never    Comment: Pt reports rare use hx of denies 10-01-17  . Drug use: No  . Sexual activity: Yes  Other Topics Concern  . Not on file  Social History Narrative  . Not on file    Past Surgical History:  Procedure Laterality Date  . CHOLECYSTECTOMY N/A 07/23/2017   Procedure: LAPAROSCOPIC CHOLECYSTECTOMY;  Surgeon: Kinsinger, Arta Bruce, MD;  Location: [facility];  Service: General;  Laterality: N/A;  . COLONOSCOPY    . EYE SURGERY Bilateral    Cataracts removed w/  lens implant  . HERNIA REPAIR     Left 36 years ago . Right inguinal hernia repair 10-01-17 Dr. Kieth Brightly  . INGUINAL HERNIA REPAIR Right 10/01/2017   Procedure: RIGHT INGUINAL HERNIA REPAIR WITH MESH;  Surgeon: Kinsinger, Arta Bruce, MD;  Location: [facility];  Service: General;  Laterality: Right;  TAP BLOCK  . INSERTION OF MESH Right 10/01/2017   Procedure: INSERTION OF MESH;  Surgeon: Kinsinger, Arta Bruce, MD;  Location: [facility];  Service: General;  Laterality: Right;  . TONSILLECTOMY    . TONSILLECTOMY    . VIDEO BRONCHOSCOPY Bilateral 07/26/2015   Procedure: VIDEO BRONCHOSCOPY WITH FLUORO;  Surgeon: [name], MD;  Location: [facility];  Service: Cardiopulmonary;  Laterality: Bilateral;  . VIDEO BRONCHOSCOPY WITH ENDOBRONCHIAL NAVIGATION N/A 08/23/2015   Procedure: VIDEO BRONCHOSCOPY WITH ENDOBRONCHIAL NAVIGATION;  Surgeon: Grace Isaac, MD;  Location: [place];  Service: Thoracic;  Laterality: N/A;  . VIDEO BRONCHOSCOPY WITH ENDOBRONCHIAL ULTRASOUND N/A 08/23/2015   Procedure: VIDEO BRONCHOSCOPY WITH ENDOBRONCHIAL ULTRASOUND;  Surgeon: Grace Isaac, MD;  Location: [facility];  Service: Thoracic;  Laterality: N/A;    Family History  Problem Relation Age of Onset  . Leukemia Father   . Emphysema Father   . Learning disabilities Son   . Leukemia Other   . Stroke Other     Allergies  Allergen Reactions  . Iodine Other (See Comments)    neck swells    Current Outpatient Medications on File Prior to Visit  Medication Sig Dispense Refill  . albuterol (PROVENTIL HFA;VENTOLIN HFA) 108 (90 Base) MCG/ACT inhaler Inhale 2 puffs into the lungs every 6 (six) hours as needed for wheezing or shortness of breath. 1 Inhaler 1  . apixaban (ELIQUIS) 5 MG TABS tablet Take 1 tablet (5 mg total) by mouth 2 (two) times daily. 180 tablet 3  . budesonide-formoterol (SYMBICORT) 160-4.5 MCG/ACT inhaler Inhale 2 puffs into the lungs 2 (two) times daily. (Patient taking differently: Inhale 2 puffs  daily into the lungs. ) 1 Inhaler 3  . diltiazem (CARDIZEM CD) 120 MG 24 hr capsule Take 1 capsule (120 mg total) by mouth daily. 90 capsule 3  . HYDROcodone-acetaminophen (NORCO/VICODIN) 5-325 MG tablet Take 1 tablet by mouth every 6 (six) hours as needed for moderate pain. 30 tablet 0  . omeprazole (PRILOSEC) 40 MG capsule TAKE 1 CAPSULE (40 MG TOTAL) BY MOUTH DAILY. PATIENT NEEDS OFFICE VISIT FOR FURTHER REFILLS 30 capsule 5  . pravastatin (PRAVACHOL) 80  MG tablet TAKE 1 TABLET (80 MG TOTAL) BY MOUTH DAILY. (Patient taking differently: Take 80 mg at bedtime by mouth. ) 30 tablet 5   No current facility-administered medications on file prior to visit.     BP 131/73 (BP Location: Right Arm, Patient Position: Sitting, Cuff Size: Normal)   Pulse 72   Temp 98.3 F (36.8 C) (Oral)   Resp 18   Ht 5\' 8"  (1.727 m)   Wt 174 lb 6.4 oz (79.1 kg)   SpO2 98%   BMI 26.52 kg/m    Objective:   Physical Exam  Constitutional: He is oriented to person, place, and time. He appears well-developed and well-nourished. No distress.  HENT:  Head: Normocephalic and atraumatic.  Mouth/Throat: Posterior oropharyngeal erythema present. No oropharyngeal exudate or posterior oropharyngeal edema.  Cardiovascular: Normal rate and regular rhythm.  No murmur heard. Pulmonary/Chest: Effort normal. No respiratory distress. He has no wheezes. He has no rales.  Decreased breath sounds throughout  Musculoskeletal: He exhibits no edema.  Neurological: He is alert and oriented to person, place, and time.  Skin: Skin is warm and dry.  Psychiatric: He has a normal mood and affect. His behavior is normal. Thought content normal.          Assessment & Plan:  Sore throat-rapid strep is negative.  Likely viral.  Advised patient to call if symptoms worsen or if they do not improve the next few days.

## 2017-12-25 NOTE — Assessment & Plan Note (Addendum)
Patient is advised to follow-up as scheduled with oncology.  Suspect that his chronic cough is related to his lung cancer history and radiation.  He will have follow-up CT with oncology next month.

## 2017-12-25 NOTE — Telephone Encounter (Signed)
Patient's wife notified and verbalized understanding.  

## 2017-12-25 NOTE — Assessment & Plan Note (Signed)
Will obtain follow-up abdominal aortic ultrasound.  See phone note.

## 2017-12-25 NOTE — Telephone Encounter (Signed)
Please contact patient and let him know that I reviewed his chart and it appears that he is due for a follow-up ultrasound of his aorta.  I have placed this order and he should be contacted about scheduling.

## 2017-12-25 NOTE — Assessment & Plan Note (Signed)
Clinically stable.  Obtain follow-up A1c.

## 2017-12-25 NOTE — Assessment & Plan Note (Signed)
Tolerating statin, obtain follow-up lipid panel. 

## 2017-12-25 NOTE — Assessment & Plan Note (Signed)
Rate stable on diltiazem continue Eliquis.  Med management per cardiology.

## 2017-12-25 NOTE — Assessment & Plan Note (Signed)
>>  ASSESSMENT AND PLAN FOR ATRIAL FIBRILLATION ([facility]) WRITTEN ON 12/25/2017 10:09 AM BY O'SULLIVAN, Creston Klas, NP  Rate stable on diltiazem continue Eliquis.  Med management per cardiology.

## 2017-12-25 NOTE — Patient Instructions (Signed)
For sore throat, you may use tylenol as needed and salt water gargles. Complete lab work prior to leaving.

## 2017-12-25 NOTE — Assessment & Plan Note (Signed)
Clinically stable.  Continue current meds.

## 2017-12-29 ENCOUNTER — Telehealth: Payer: Self-pay | Admitting: *Deleted

## 2017-12-29 ENCOUNTER — Encounter: Payer: Self-pay | Admitting: Family

## 2017-12-29 NOTE — Telephone Encounter (Signed)
Oncology Nurse Navigator Documentation  Oncology Nurse Navigator Flowsheets 12/29/2017  Navigator Location [facility]  Navigator Encounter Type Telephone/I followed up on Mr. Vacha's schedule. He does not have an appt for his ct chest scan. I called and spoke with his wife. She states they usually go to [place] for scan.  I asked that she call that facility today to get the scan done this week before he sees Dr. Julien Nordmann.  I also asked that she ask the facility to make CD of scan.  She verbalized understanding.    Telephone Outgoing Call  Treatment Phase Follow-up  Barriers/Navigation Needs Coordination of Care;Education  Education Other  Interventions Education;Coordination of Care  Coordination of Care Other  Education Method Verbal  Acuity Level 2  Time Spent with Patient 30

## 2017-12-29 NOTE — Progress Notes (Signed)
Mailed out to pt

## 2017-12-30 ENCOUNTER — Other Ambulatory Visit: Payer: Self-pay | Admitting: Medical Oncology

## 2017-12-31 ENCOUNTER — Telehealth: Payer: Self-pay | Admitting: Internal Medicine

## 2017-12-31 NOTE — Telephone Encounter (Signed)
Returned call to patients wife regarding lab appointment for 2/28.  She stated that he was out of town and would not be able to make this appointment.  She R/S to 3/1 @ 8:15 at [facility] instead of [facility].

## 2018-01-01 ENCOUNTER — Other Ambulatory Visit: Payer: Self-pay | Admitting: *Deleted

## 2018-01-01 ENCOUNTER — Other Ambulatory Visit: Payer: Medicare Other

## 2018-01-01 ENCOUNTER — Ambulatory Visit (HOSPITAL_BASED_OUTPATIENT_CLINIC_OR_DEPARTMENT_OTHER): Payer: Medicare Other

## 2018-01-01 DIAGNOSIS — C3410 Malignant neoplasm of upper lobe, unspecified bronchus or lung: Secondary | ICD-10-CM

## 2018-01-02 ENCOUNTER — Inpatient Hospital Stay: Payer: Medicare Other | Attending: Internal Medicine

## 2018-01-02 ENCOUNTER — Ambulatory Visit (HOSPITAL_BASED_OUTPATIENT_CLINIC_OR_DEPARTMENT_OTHER)
Admission: RE | Admit: 2018-01-02 | Discharge: 2018-01-02 | Disposition: A | Discharge status: Home / Self Care | Payer: Medicare Other | Source: Ambulatory Visit | Attending: Internal Medicine | Admitting: Internal Medicine

## 2018-01-02 DIAGNOSIS — J90 Pleural effusion, not elsewhere classified: Secondary | ICD-10-CM | POA: Insufficient documentation

## 2018-01-02 DIAGNOSIS — Z85118 Personal history of other malignant neoplasm of bronchus and lung: Secondary | ICD-10-CM | POA: Diagnosis present

## 2018-01-02 DIAGNOSIS — J8410 Pulmonary fibrosis, unspecified: Secondary | ICD-10-CM | POA: Diagnosis not present

## 2018-01-02 DIAGNOSIS — C3432 Malignant neoplasm of lower lobe, left bronchus or lung: Secondary | ICD-10-CM

## 2018-01-02 DIAGNOSIS — J439 Emphysema, unspecified: Secondary | ICD-10-CM | POA: Diagnosis not present

## 2018-01-02 DIAGNOSIS — I2510 Atherosclerotic heart disease of native coronary artery without angina pectoris: Secondary | ICD-10-CM | POA: Diagnosis not present

## 2018-01-02 DIAGNOSIS — C3410 Malignant neoplasm of upper lobe, unspecified bronchus or lung: Secondary | ICD-10-CM

## 2018-01-02 DIAGNOSIS — J449 Chronic obstructive pulmonary disease, unspecified: Secondary | ICD-10-CM | POA: Diagnosis present

## 2018-01-02 DIAGNOSIS — I700 Atherosclerosis of aorta: Secondary | ICD-10-CM | POA: Diagnosis not present

## 2018-01-02 LAB — CBC WITH DIFFERENTIAL (CANCER CENTER ONLY)
Basophils Absolute: 0 10*3/uL (ref 0.0–0.1)
Basophils Relative: 0 %
Eosinophils Absolute: 0.1 10*3/uL (ref 0.0–0.5)
Eosinophils Relative: 1 %
HCT: 39.3 % (ref 38.4–49.9)
Hemoglobin: 13.1 g/dL (ref 13.0–17.1)
Lymphocytes Relative: 9 %
Lymphs Abs: 0.9 10*3/uL (ref 0.9–3.3)
MCH: 31.1 pg (ref 27.2–33.4)
MCHC: 33.4 g/dL (ref 32.0–36.0)
MCV: 93.3 fL (ref 79.3–98.0)
Monocytes Absolute: 0.8 10*3/uL (ref 0.1–0.9)
Monocytes Relative: 9 %
Neutro Abs: 7.9 10*3/uL — ABNORMAL HIGH (ref 1.5–6.5)
Neutrophils Relative %: 81 %
Platelet Count: 280 10*3/uL (ref 140–400)
RBC: 4.21 MIL/uL (ref 4.20–5.82)
RDW: 13.8 % (ref 11.0–14.6)
WBC Count: 9.8 10*3/uL (ref 4.0–10.3)

## 2018-01-05 ENCOUNTER — Encounter: Payer: Self-pay | Admitting: Internal Medicine

## 2018-01-05 ENCOUNTER — Inpatient Hospital Stay (HOSPITAL_BASED_OUTPATIENT_CLINIC_OR_DEPARTMENT_OTHER): Payer: Medicare Other | Admitting: Internal Medicine

## 2018-01-05 ENCOUNTER — Telehealth: Payer: Self-pay | Admitting: Internal Medicine

## 2018-01-05 DIAGNOSIS — C3490 Malignant neoplasm of unspecified part of unspecified bronchus or lung: Secondary | ICD-10-CM

## 2018-01-05 DIAGNOSIS — Z85118 Personal history of other malignant neoplasm of bronchus and lung: Secondary | ICD-10-CM

## 2018-01-05 NOTE — Progress Notes (Signed)
[facility] [phone]   [phone]  OFFICE PROGRESS NOTE  [name], NP [place] [address]  DIAGNOSIS: Stage IIB (T3, N0, M0) non-small cell lung cancer, squamous cell carcinoma presented with left lower lobe endobronchial lesion as well as suspicious groundglass opacity in the left upper lobe diagnosed in September 2016.  PRIOR THERAPY: Curative radiotherapy to the left upper lobe lung mass under the care of Dr. Sondra Come completed 10/25/2015.  CURRENT THERAPY: Observation  INTERVAL HISTORY: Robert Bates 77 y.o. male returns to the clinic today for 6 months follow-up visit.  The patient is feeling fine today with no specific complaints except for mild cough as he is recovering from flulike symptoms.  He denied having any chest pain, shortness of breath or hemoptysis.  He denied having any recent weight loss or night sweats.  He has no nausea, vomiting, diarrhea or constipation.  He is here today for evaluation with repeat CT scan of the chest for restaging of his disease.  MEDICAL HISTORY: Past Medical History:  Diagnosis Date  . Alcoholism ([place])    past problem per pt but admits to still drinking - unclear how much  . Anxiety   . Arthritis   . Cancer ([place]) 2016   lung- 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  [name] COPD (chronic obstructive pulmonary disease) ([place])   . Diabetes type 2, controlled ([place]) 07/31/2017  . Dysrhythmia    a fib  . GERD (gastroesophageal reflux disease)   . Hematuria    refuses work up or referral - understands risks of morbidity / mortality - 11/2008, 12/2008  . History of hiatal hernia   . History of kidney stones   . Hyperlipemia   . Meningioma ([place]) 10/25/2013   Follows with Dr. [name].   . Peripheral vascular disease ([place])    Abdominal Aortic Aneursym  . Pneumonia    as a child  . Radiation  09/18/15-10/25/15   left lower lobe 70.2 Gy  . Tobacco abuse     ALLERGIES:  is allergic to iodine.  MEDICATIONS:  Current Outpatient Medications  Medication Sig Dispense Refill  . albuterol (PROVENTIL HFA;VENTOLIN HFA) 108 (90 Base) MCG/ACT inhaler Inhale 2 puffs into the lungs every 6 (six) hours as needed for wheezing or shortness of breath. 1 Inhaler 1  . apixaban (ELIQUIS) 5 MG TABS tablet Take 1 tablet (5 mg total) by mouth 2 (two) times daily. 180 tablet 3  . budesonide-formoterol (SYMBICORT) 160-4.5 MCG/ACT inhaler Inhale 2 puffs into the lungs 2 (two) times daily. (Patient taking differently: Inhale 2 puffs daily into the lungs. ) 1 Inhaler 3  . diltiazem (CARDIZEM CD) 120 MG 24 hr capsule Take 1 capsule (120 mg total) by mouth daily. 90 capsule 3  . HYDROcodone-acetaminophen (NORCO/VICODIN) 5-325 MG tablet Take 1 tablet by mouth every 6 (six) hours as needed for moderate pain. 30 tablet 0  . omeprazole (PRILOSEC) 40 MG capsule TAKE 1 CAPSULE (40 MG TOTAL) BY MOUTH DAILY. PATIENT NEEDS OFFICE VISIT FOR FURTHER REFILLS 30 capsule 5  . pravastatin (PRAVACHOL) 80 MG tablet TAKE 1 TABLET (80 MG TOTAL) BY MOUTH DAILY. (Patient taking differently: Take 80 mg at bedtime by mouth. ) 30 tablet 5   No current facility-administered medications for this visit.     SURGICAL HISTORY:  Past Surgical History:  Procedure Laterality Date  .  CHOLECYSTECTOMY N/A 07/23/2017   Procedure: LAPAROSCOPIC CHOLECYSTECTOMY;  Surgeon: Kinsinger, Arta Bruce, MD;  Location: [facility];  Service: General;  Laterality: N/A;  . COLONOSCOPY    . EYE SURGERY Bilateral    Cataracts removed w/ lens implant  . HERNIA REPAIR     Left 36 years ago . Right inguinal hernia repair 10-01-17 Dr. Kieth Brightly  . INGUINAL HERNIA REPAIR Right 10/01/2017   Procedure: RIGHT INGUINAL HERNIA REPAIR WITH MESH;  Surgeon: Kinsinger, Arta Bruce, MD;  Location: [facility];  Service: General;  Laterality: Right;  TAP BLOCK  . INSERTION OF MESH  Right 10/01/2017   Procedure: INSERTION OF MESH;  Surgeon: Kinsinger, Arta Bruce, MD;  Location: [facility];  Service: General;  Laterality: Right;  . TONSILLECTOMY    . TONSILLECTOMY    . VIDEO BRONCHOSCOPY Bilateral 07/26/2015   Procedure: VIDEO BRONCHOSCOPY WITH FLUORO;  Surgeon: [name], MD;  [facility];  Service: Cardiopulmonary;  Laterality: Bilateral;  . VIDEO BRONCHOSCOPY WITH ENDOBRONCHIAL NAVIGATION N/A 08/23/2015   Procedure: VIDEO BRONCHOSCOPY WITH ENDOBRONCHIAL NAVIGATION;  Surgeon: Grace Isaac, MD;  Location: [facility];  Service: Thoracic;  Laterality: N/A;  . VIDEO BRONCHOSCOPY WITH ENDOBRONCHIAL ULTRASOUND N/A 08/23/2015   Procedure: VIDEO BRONCHOSCOPY WITH ENDOBRONCHIAL ULTRASOUND;  Surgeon: Grace Isaac, MD;  Location: [facility];  Service: Thoracic;  Laterality: N/A;    REVIEW OF SYSTEMS:  A comprehensive review of systems was negative except for: Respiratory: positive for cough   PHYSICAL EXAMINATION: General appearance: alert, cooperative and no distress Head: Normocephalic, without obvious abnormality, atraumatic Neck: no adenopathy, no JVD, supple, symmetrical, trachea midline and thyroid not enlarged, symmetric, no tenderness/mass/nodules Lymph nodes: Cervical, supraclavicular, and axillary nodes normal. Resp: clear to auscultation bilaterally Back: symmetric, no curvature. ROM normal. No CVA tenderness. Cardio: regular rate and rhythm, S1, S2 normal, no murmur, click, rub or gallop GI: soft, non-tender; bowel sounds normal; no masses,  no organomegaly Extremities: extremities normal, atraumatic, no cyanosis or edema  ECOG PERFORMANCE STATUS: 1 - Symptomatic but completely ambulatory  Blood pressure 140/82, pulse 73, temperature (!) 97.5 F (36.4 C), temperature source Oral, resp. rate 19, height 5\' 8"  (1.727 m), weight 164 lb 14.4 oz (74.8 kg), SpO2 95 %.  LABORATORY DATA: Lab Results  Component Value Date   WBC 9.8 01/02/2018   HGB 13.7  09/30/2017   HCT 39.3 01/02/2018   MCV 93.3 01/02/2018   PLT 280 01/02/2018      Chemistry      Component Value Date/Time   NA 140 12/25/2017 0856   NA 140 06/17/2017 1315   K 3.5 12/25/2017 0856   K 4.5 06/17/2017 1315   CL 103 12/25/2017 0856   CO2 31 12/25/2017 0856   CO2 28 06/17/2017 1315   BUN 10 12/25/2017 0856   BUN 18.1 06/17/2017 1315   CREATININE 0.67 12/25/2017 0856   CREATININE 0.8 06/17/2017 1315      Component Value Date/Time   CALCIUM 9.3 12/25/2017 0856   CALCIUM 9.8 06/17/2017 1315   ALKPHOS 72 12/25/2017 0856   ALKPHOS 82 06/17/2017 1315   AST 10 12/25/2017 0856   AST 16 06/17/2017 1315   ALT 8 12/25/2017 0856   ALT 10 06/17/2017 1315   BILITOT 0.3 12/25/2017 0856   BILITOT 0.47 06/17/2017 1315       RADIOGRAPHIC STUDIES: Ct Chest Wo Contrast  Result Date: 01/02/2018 CLINICAL DATA:  Non-small cell lung cancer.  Follow-up. EXAM: CT CHEST WITHOUT CONTRAST TECHNIQUE: Multidetector CT imaging  of the chest was performed following the standard protocol without IV contrast. COMPARISON:  06/22/2017. FINDINGS: Cardiovascular: Normal heart size. Stable small pericardial effusion/thickening noted anteriorly. Aortic atherosclerosis. LAD and left main coronary artery calcifications noted. Mediastinum/Nodes: Normal appearance of the thyroid gland. The trachea appears patent and is midline. Moderate size hiatal hernia identified. Right paratracheal lymph node appears increased from previous exam. Currently 1.3 cm versus 0.9 cm previously, image 69/2. Lungs/Pleura: There is a small left pleural effusion which is stable compared with previous exam. Mild changes of emphysema. Masslike architectural distortion and fibrosis within the left upper lobe and perihilar left lower lobe with surrounding volume loss is again noted. The appearance is unchanged from previous exam and is compatible with changes of radiation fibrosis. Stable small peripheral nodule in the right upper lobe  measuring 2-3 mm, image 43/3. Upper Abdomen: Stable small liver cysts. Left adrenal adenoma unchanged, image 162/2. Musculoskeletal: No aggressive lytic or sclerotic bone lesions. Unchanged appearance of left lateral third rib fracture. IMPRESSION: 1. 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 No suspicious findings for local tumor recurrence. 2. The right paratracheal lymph node has mildly increased in size from previous exam. Nonspecific but warrants close attention on follow-up imaging. 3. Stable left pleural effusion. 4. LAD and left main coronary artery calcifications. 5. Aortic Atherosclerosis (ICD10-I70.0) and Emphysema (ICD10-J43.9). Electronically Signed   By: Kerby Moors M.D.   On: 01/02/2018 09:59    ASSESSMENT AND PLAN:  This is a very pleasant 77 years old white male with a stage IIB non-small cell lung cancer, squamous cell carcinoma presented with left upper lobe endobronchial lesion in addition to left upper lobe suspicious groundglass opacity. He is status post curative radiotherapy under the care of Dr. Sondra Come. The patient is currently on observation.  Repeat CT scan of the chest showed no concerning findings for disease progression except for mildly increased right paratracheal lymph node, nonspecific.  I discussed the scan results with the patient today.  I recommended for him to continue on observation with repeat CT scan of the chest in 6 months for further evaluation of this lymph node. The patient was advised to call immediately if he has any concerning symptoms in the interval. The patient voices understanding of current disease status and treatment options and is in agreement with the current care plan. All questions were answered. The patient knows to call the clinic with any problems, questions or concerns. We can certainly see the patient much sooner if necessary. I spent 10 minutes counseling the patient face to face. The total time spent in the appointment  was 15 minutes.  Disclaimer: This note was dictated with voice recognition software. Similar sounding words can inadvertently be transcribed and may not be corrected upon review.

## 2018-01-05 NOTE — Telephone Encounter (Signed)
Scheduled appt per 3/4 los - per patient request wants labs and ct done In [facility] - gave patient AVS and calender per los. Sent message to [facility] scheduler to schedule lab appt.

## 2018-01-08 ENCOUNTER — Telehealth: Payer: Self-pay | Admitting: Family

## 2018-01-08 NOTE — Telephone Encounter (Signed)
Please advise 

## 2018-01-08 NOTE — Telephone Encounter (Signed)
Copied from [place] [id number]. Topic: Quick Communication - See Telephone Encounter >> Jan 08, 2018  9:35 AM Cleaster Corin, NT wrote: CRM for notification. See Telephone encounter for:   01/08/18.pt. Would like to see if he can have something called for his cold preferably an antibiotic. Pt cant seem to get rid of cold   [place], [place] - [place] [address] [place] [address] Phone: [phone] Fax: [phone]

## 2018-01-08 NOTE — Telephone Encounter (Signed)
Needs OV please.

## 2018-01-08 NOTE — Telephone Encounter (Signed)
Called pt and LVM informing the pt of Melissa's decision. Advised pt to call and schedule an appt.

## 2018-01-12 ENCOUNTER — Other Ambulatory Visit: Payer: Self-pay | Admitting: Family Medicine

## 2018-01-12 NOTE — Addendum Note (Signed)
Addended by: [name] on: 01/12/2018 09:17 AM   Modules accepted: Orders

## 2018-01-16 ENCOUNTER — Ambulatory Visit (HOSPITAL_BASED_OUTPATIENT_CLINIC_OR_DEPARTMENT_OTHER)
Admission: RE | Admit: 2018-01-16 | Discharge: 2018-01-16 | Disposition: A | Discharge status: Home / Self Care | Payer: Medicare Other | Source: Ambulatory Visit | Attending: Family | Admitting: Family

## 2018-01-16 DIAGNOSIS — I714 Abdominal aortic aneurysm, without rupture, unspecified: Secondary | ICD-10-CM

## 2018-01-16 MED FILL — OMEPRAZOLE DR 40 MG CAPSULE: 40 | 30 days supply | Qty: 30 | Fill #4

## 2018-01-21 MED FILL — PRAVASTATIN SODIUM 80 MG TA: 80 | 30 days supply | Qty: 30 | Fill #4

## 2018-01-21 MED FILL — ELIQUIS 5 MG TABLET: 5 | 30 days supply | Qty: 60 | Fill #5

## 2018-01-23 ENCOUNTER — Telehealth: Payer: Self-pay | Admitting: *Deleted

## 2018-01-23 NOTE — Telephone Encounter (Signed)
No further instructions- agree.

## 2018-01-23 NOTE — Telephone Encounter (Signed)
Pt has been schedule for OV on 01/28/18 as he will be off work on Wednesday and Saturdays. Advised pt that PCP does not have Saturday hours and he voices understanding.

## 2018-01-23 NOTE — Telephone Encounter (Signed)
Spoke with pt. 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 Advised pt he would need to be seen in the office to discuss request and other options. Pt states he will try Tylenol until he can see PCP. Advised pt he could try applying heat or ice for 15 min intervals to see if either of those will give some relief as well.  Please advise if any further instructions?  Copied from [place]. 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 He would like to speak specifically with Mardene Celeste and is hoping for a neck brace. Please call him back to let him know if he needs an appt or if NP O'sullivan can prescribe him it.

## 2018-01-28 ENCOUNTER — Ambulatory Visit (INDEPENDENT_AMBULATORY_CARE_PROVIDER_SITE_OTHER): Payer: Medicare Other | Admitting: Family

## 2018-01-28 ENCOUNTER — Ambulatory Visit (HOSPITAL_BASED_OUTPATIENT_CLINIC_OR_DEPARTMENT_OTHER)
Admission: RE | Admit: 2018-01-28 | Discharge: 2018-01-28 | Disposition: A | Discharge status: Home / Self Care | Payer: Medicare Other | Source: Ambulatory Visit | Attending: Family | Admitting: Family

## 2018-01-28 ENCOUNTER — Encounter: Payer: Self-pay | Admitting: Family

## 2018-01-28 VITALS — BP 118/81 | HR 65 | Temp 98.2°F | Resp 16 | Ht 68.0 in | Wt 169.0 lb

## 2018-01-28 DIAGNOSIS — J449 Chronic obstructive pulmonary disease, unspecified: Secondary | ICD-10-CM | POA: Insufficient documentation

## 2018-01-28 DIAGNOSIS — J40 Bronchitis, not specified as acute or chronic: Secondary | ICD-10-CM | POA: Diagnosis not present

## 2018-01-28 DIAGNOSIS — M542 Cervicalgia: Secondary | ICD-10-CM | POA: Insufficient documentation

## 2018-01-28 DIAGNOSIS — R05 Cough: Secondary | ICD-10-CM

## 2018-01-28 DIAGNOSIS — I700 Atherosclerosis of aorta: Secondary | ICD-10-CM | POA: Diagnosis not present

## 2018-01-28 DIAGNOSIS — R059 Cough, unspecified: Secondary | ICD-10-CM

## 2018-01-28 MED ORDER — AZITHROMYCIN 250 MG PO TABS: ORAL_TABLET | ORAL | 0 refills | Status: DC

## 2018-01-28 NOTE — Patient Instructions (Signed)
Complete neck and chest xrays on the first floor. Start zpak (antibiotic) for bronchitis. You may add mucinex 600mg  twice daily as needed for chest congestion. Call if new/worsening symptoms or if you are not improved in 1 week.

## 2018-01-28 NOTE — Progress Notes (Signed)
Subjective:    Patient ID: Robert Bates, male    DOB: 10/31/1941, 77 y.o.   MRN: [id number]  HPI  Patient is a 77 yr old male who presents today with two concerns:  1) Neck pain- He has been working more as a Administrator.  Has some neck pain on the right side.  If he presses against the neck the pain is improved.  Pain is non-radiating.   2) Cough- pt reports + productive cough. He has hx of lung CA, Stage IIB (T3, N0, M0) non-small cell lung cancer. He had curative radiotherapy to the LUL mass. 2016.  Continues to follow with oncology for surveillance and had last visit 01/05/18.  Denies fever.  Cough a few weeks back, improved then "came back."     Review of Systems See HPI  Past Medical History:  Diagnosis Date  . Alcoholism ([place])    past problem per pt but admits to still drinking - unclear how much  . Anxiety   . Arthritis   . Cancer ([place]) 2016   lung- 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  [name] COPD (chronic obstructive pulmonary disease) ([place])   . Diabetes type 2, controlled ([place]) 07/31/2017  . Dysrhythmia    a fib  . GERD (gastroesophageal reflux disease)   . Hematuria    refuses work up or referral - understands risks of morbidity / mortality - 11/2008, 12/2008  . History of hiatal hernia   . History of kidney stones   . Hyperlipemia   . Meningioma ([place]) 10/25/2013   Follows with Dr. [name].   . Peripheral vascular disease ([place])    Abdominal Aortic Aneursym  . Pneumonia    as a child  . Radiation 09/18/15-10/25/15   left lower lobe 70.2 Gy  . Tobacco abuse      Social History   Socioeconomic History  . Marital status: Married    Spouse name: Not on file  . Number of children: 2  . Years of education: Not on file  . Highest education level: Not on file  Occupational History  . Occupation: Retired    Fish farm manager: [employer]    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[employer]    . Financial resource strain: Not on file  . Food insecurity:    Worry: Not on file    Inability: Not on file  . Transportation needs:    Medical: Not on file    Non-medical: Not on file  Tobacco Use  . Smoking status: Former Smoker    Packs/day: 1.00    Years: 57.00    Pack years: 57.00    Last attempt to quit: 08/08/2015    Years since quitting: 2.4  . Smokeless tobacco: Former Systems developer    Types: Chew    Quit date: 11/04/1958  . Tobacco comment: occasional use sneaks around  Substance and Sexual Activity  . Alcohol use: Yes    Alcohol/week: 0.0 oz    Frequency: Never    Comment: Pt reports rare use hx of denies 10-01-17  . Drug use: No  . Sexual activity: Yes  Lifestyle  . Physical activity:    Days per week: Not on file    Minutes per session: Not on file  . Stress: Not on file  Relationships  . Social connections:    Talks on phone: Not on file    Gets together: Not  on file    Attends religious service: Not on file    Active member of club or organization: Not on file    Attends meetings of clubs or organizations: Not on file    Relationship status: Not on file  . Intimate partner violence:    Fear of current or ex partner: Not on file    Emotionally abused: Not on file    Physically abused: Not on file    Forced sexual activity: Not on file  Other Topics Concern  . Not on file  Social History Narrative  . Not on file    Past Surgical History:  Procedure Laterality Date  . CHOLECYSTECTOMY N/A 07/23/2017   Procedure: LAPAROSCOPIC CHOLECYSTECTOMY;  Surgeon: Kinsinger, Arta Bruce, MD;  [facility];  Service: General;  Laterality: N/A;  . COLONOSCOPY    . EYE SURGERY Bilateral    Cataracts removed w/ lens implant  . HERNIA REPAIR     Left 36 years ago . Right inguinal hernia repair 10-01-17 Dr. Kieth Brightly  . INGUINAL HERNIA REPAIR Right 10/01/2017   Procedure: RIGHT INGUINAL HERNIA REPAIR WITH MESH;  Surgeon: Kinsinger, Arta Bruce, MD;  [facility];   Service: General;  Laterality: Right;  TAP BLOCK  . INSERTION OF MESH Right 10/01/2017   Procedure: INSERTION OF MESH;  Surgeon: Kinsinger, Arta Bruce, MD;  [facility];  Service: General;  Laterality: Right;  . TONSILLECTOMY    . TONSILLECTOMY    . VIDEO BRONCHOSCOPY Bilateral 07/26/2015   Procedure: VIDEO BRONCHOSCOPY WITH FLUORO;  Surgeon: [name], MD;  [facility];  Service: Cardiopulmonary;  Laterality: Bilateral;  . VIDEO BRONCHOSCOPY WITH ENDOBRONCHIAL NAVIGATION N/A 08/23/2015   Procedure: VIDEO BRONCHOSCOPY WITH ENDOBRONCHIAL NAVIGATION;  Surgeon: Grace Isaac, MD;  Location: [facility];  Service: Thoracic;  Laterality: N/A;  . VIDEO BRONCHOSCOPY WITH ENDOBRONCHIAL ULTRASOUND N/A 08/23/2015   Procedure: VIDEO BRONCHOSCOPY WITH ENDOBRONCHIAL ULTRASOUND;  Surgeon: Grace Isaac, MD;  [facility];  Service: Thoracic;  Laterality: N/A;    Family History  Problem Relation Age of Onset  . Leukemia Father   . Emphysema Father   . Learning disabilities Son   . Leukemia Other   . Stroke Other     Allergies  Allergen Reactions  . Iodine Other (See Comments)    neck swells    Current Outpatient Medications on File Prior to Visit  Medication Sig Dispense Refill  . albuterol (PROVENTIL HFA;VENTOLIN HFA) 108 (90 Base) MCG/ACT inhaler Inhale 2 puffs into the lungs every 6 (six) hours as needed for wheezing or shortness of breath. 1 Inhaler 1  . apixaban (ELIQUIS) 5 MG TABS tablet Take 1 tablet (5 mg total) by mouth 2 (two) times daily. 180 tablet 3  . budesonide-formoterol (SYMBICORT) 160-4.5 MCG/ACT inhaler Inhale 2 puffs into the lungs 2 (two) times daily. (Patient taking differently: Inhale 2 puffs daily into the lungs. ) 1 Inhaler 3  . diltiazem (CARDIZEM CD) 120 MG 24 hr capsule Take 1 capsule (120 mg total) by mouth daily. 90 capsule 3  . HYDROcodone-acetaminophen (NORCO/VICODIN) 5-325 MG tablet Take 1 tablet by mouth every 6 (six) hours as needed for  moderate pain. 30 tablet 0  . omeprazole (PRILOSEC) 40 MG capsule TAKE 1 CAPSULE (40 MG TOTAL) BY MOUTH DAILY. PATIENT NEEDS OFFICE VISIT FOR FURTHER REFILLS 30 capsule 5  . pravastatin (PRAVACHOL) 80 MG tablet TAKE 1 TABLET (80 MG TOTAL) BY MOUTH DAILY. (Patient taking differently: Take 80 mg at  bedtime by mouth. ) 30 tablet 5   No current facility-administered medications on file prior to visit.     BP 118/81 (BP Location: Right Arm, Patient Position: Sitting, Cuff Size: Small)   Pulse 65   Temp 98.2 F (36.8 C) (Oral)   Resp 16   Ht 5\' 8"  (1.727 m)   Wt 169 lb (76.7 kg)   SpO2 100%   BMI 25.70 kg/m       Objective:   Physical Exam  Constitutional: He is oriented to person, place, and time. He appears well-developed and well-nourished. No distress.  HENT:  Head: Normocephalic and atraumatic.  Cerumen impaction bilaterally  Cardiovascular: Normal rate and regular rhythm.  No murmur heard. Pulmonary/Chest: Effort normal and breath sounds normal. No respiratory distress. He has no wheezes. He has no rales.  Musculoskeletal: He exhibits no edema.  Neurological: He is alert and oriented to person, place, and time.  Skin: Skin is warm and dry.  Psychiatric: He has a normal mood and affect. His behavior is normal. Thought content normal.          Assessment & Plan:  Bronchitis- zpak, mucinex, cxr to further evaluate. He is advised to follow up if not improved in 1 week.   Neck pain- suspect DDD, check plain film of c spine.

## 2018-02-10 MED FILL — CARTIA XT 120 MG CAPSULE SA: 120 | 90 days supply | Qty: 90 | Fill #1

## 2018-02-10 MED FILL — OMEPRAZOLE DR 40 MG CAPSULE: 40 | 30 days supply | Qty: 30 | Fill #5

## 2018-02-16 ENCOUNTER — Telehealth: Payer: Self-pay | Admitting: Family

## 2018-02-17 ENCOUNTER — Other Ambulatory Visit: Payer: Self-pay

## 2018-02-18 NOTE — Telephone Encounter (Signed)
Would like to speak with CMA regarding dosage of this med. Call back [phone] or [phone]

## 2018-02-19 ENCOUNTER — Telehealth: Payer: Self-pay | Admitting: Family

## 2018-02-19 MED ORDER — BUDESONIDE-FORMOTEROL FUMARATE 160-4.5 MCG/ACT IN AERO: 2.0000 | INHALATION_SPRAY | Freq: Two times a day (BID) | RESPIRATORY_TRACT | 5 refills | Status: DC

## 2018-02-19 MED FILL — SYMBICORT 160-4.5 MCG INH: 160-4.5 | 30 days supply | Qty: 10 | Fill #0

## 2018-02-19 NOTE — Addendum Note (Signed)
Addended by: Kelle Darting A on: 02/19/2018 12:16 PM   Modules accepted: Orders

## 2018-02-19 NOTE — Telephone Encounter (Signed)
See refill encounter from 02/18/18.

## 2018-02-19 NOTE — Telephone Encounter (Signed)
Please verify how pt. Should be taking his Symbicort.

## 2018-02-19 NOTE — Telephone Encounter (Signed)
Pt called back stating pharmacy said if directions state 2 puffs twice a day his copay will be $45 again. Rx re-sent.

## 2018-02-19 NOTE — Telephone Encounter (Signed)
Copied from [place] [id number]. Topic: Quick Communication - See Telephone Encounter >> Feb 19, 2018  9:03 AM Hewitt Shorts wrote: CRM for notification. See Telephone encounter for: 02/19/18.pt called today stating that there had been a  change in his symbicort from 2am puffs and 2 night puffs  to 2 puffs daily and that he was not told about the change   Best number to call [phone] Or daugher at [phone]

## 2018-02-19 NOTE — Telephone Encounter (Signed)
Spoke with pt. He states his most recent refill of Symbicort cost him $90 instead of $45 as it did previously. He is wanting to know if we sent a different med. Advised pt that same medication was sent and he should check with his insurance as to why cost is different. Pt did say that directions are different. Directions say 2 puffs once a day and he has been taking 2 puffs twice a day. Advised pt that our record indicated that he had only been using 2 puffs once daily but he may resume 2 puffs twice a day. Med list updated.

## 2018-02-23 MED FILL — PRAVASTATIN SODIUM 80 MG TA: 80 | 30 days supply | Qty: 30 | Fill #5

## 2018-02-23 MED FILL — ELIQUIS 5 MG TABLET: 5 | 30 days supply | Qty: 60 | Fill #6

## 2018-03-16 ENCOUNTER — Other Ambulatory Visit: Payer: Self-pay | Admitting: Family

## 2018-03-17 MED FILL — OMEPRAZOLE 40 MG CPDR: 40 | 30 days supply | Qty: 30 | Fill #0

## 2018-03-19 ENCOUNTER — Other Ambulatory Visit: Payer: Self-pay | Admitting: Family

## 2018-03-26 MED FILL — ELIQUIS 5 MG TABLET: 5 | 30 days supply | Qty: 60 | Fill #7

## 2018-03-26 MED FILL — PRAVASTATIN SODIUM 80 MG TA: 80 | 30 days supply | Qty: 30 | Fill #0

## 2018-04-16 MED FILL — OMEPRAZOLE 40 MG CPDR: 40 | 30 days supply | Qty: 30 | Fill #1

## 2018-04-16 MED FILL — SYMBICORT 160-4.5 MCG INH: 160-4.5 | 30 days supply | Qty: 10 | Fill #1

## 2018-04-17 ENCOUNTER — Ambulatory Visit (INDEPENDENT_AMBULATORY_CARE_PROVIDER_SITE_OTHER): Payer: Medicare Other | Admitting: Family

## 2018-04-17 ENCOUNTER — Encounter: Payer: Self-pay | Admitting: Family

## 2018-04-17 VITALS — BP 122/66 | HR 70 | Temp 97.5°F | Resp 18 | Wt 171.8 lb

## 2018-04-17 DIAGNOSIS — L729 Follicular cyst of the skin and subcutaneous tissue, unspecified: Secondary | ICD-10-CM

## 2018-04-17 DIAGNOSIS — M5412 Radiculopathy, cervical region: Secondary | ICD-10-CM | POA: Diagnosis not present

## 2018-04-17 MED ORDER — METHYLPREDNISOLONE 4 MG PO TBPK: ORAL_TABLET | ORAL | 0 refills | Status: DC

## 2018-04-17 MED FILL — METHYLPREDNISOLONE 4 MG TAB: 4 | 6 days supply | Qty: 21 | Fill #0

## 2018-04-17 NOTE — Progress Notes (Signed)
Subjective:    Patient ID: Robert Bates, male    DOB: 18-Dec-1940, 77 y.o.   MRN: [id number]  HPI  Mr. Los is a 77 yr old male who presents today to discuss neck pain. He was seen on 01/28/18 for same complaint at that time.  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 Reports pain is worst when he turns his head to the right. Has pain radiating down the right arm sometimes.  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    Review of Systems See HPI  Past Medical History:  Diagnosis Date  . Alcoholism ([place])    past problem per pt but admits to still drinking - unclear how much  . Anxiety   . Arthritis   . 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  [name] COPD (chronic obstructive pulmonary disease) ([place])   . Diabetes type 2, controlled ([place]) 07/31/2017  . Dysrhythmia    a fib  . GERD (gastroesophageal reflux disease)   . Hematuria    refuses work up or referral - understands risks of morbidity / mortality - 11/2008, 12/2008  . History of hiatal hernia   . History of kidney stones   . Hyperlipemia   . 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 [name].   . Peripheral vascular disease ([place])    Abdominal Aortic Aneursym  . Pneumonia    as a child  . Radiation 09/18/15-10/25/15   left lower lobe 70.2 Gy  . Tobacco abuse      Social History   Socioeconomic History  . Marital status: Married    Spouse name: Not on file  . Number of children: 2  . Years of education: Not on file  . Highest education level: Not on file  Occupational History  . 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 Financial resource strain: Not on file  . Food insecurity:    Worry: Not on file    Inability: Not on file  . Transportation needs:    Medical: Not on file   Non-medical: Not on file  Tobacco Use  . Smoking status: Former Smoker    Packs/day: 1.00    Years: 57.00    Pack years: 57.00    Last attempt to quit: 08/08/2015    Years since quitting: 2.6  . Smokeless tobacco: Former Systems developer    Types: Chew    Quit date: 11/04/1958  . Tobacco comment: occasional use sneaks around  Substance and Sexual Activity  . Alcohol use: Yes    Alcohol/week: 0.0 oz    Frequency: Never    Comment: Pt reports rare use hx of denies 10-01-17  . Drug use: No  . Sexual activity: Yes  Lifestyle  . Physical activity:    Days per week: Not on file    Minutes per session: Not on file  . Stress: Not on file  Relationships  . Social connections:    Talks on phone: Not on file    Gets together: Not on file    Attends religious service: Not on file    Active member of club or organization: Not on file    Attends meetings of clubs or organizations: Not on file    Relationship  status: Not on file  . Intimate partner violence:    Fear of current or ex partner: Not on file    Emotionally abused: Not on file    Physically abused: Not on file    Forced sexual activity: Not on file  Other Topics Concern  . Not on file  Social History Narrative  . Not on file    Past Surgical History:  Procedure Laterality Date  . CHOLECYSTECTOMY N/A 07/23/2017   Procedure: LAPAROSCOPIC CHOLECYSTECTOMY;  Surgeon: Kinsinger, Arta Bruce, MD;  [facility];  Service: General;  Laterality: N/A;  . COLONOSCOPY    . EYE SURGERY Bilateral    Cataracts removed w/ lens implant  . HERNIA REPAIR     Left 36 years ago . Right inguinal hernia repair 10-01-17 Dr. Kieth Brightly  . INGUINAL HERNIA REPAIR Right 10/01/2017   Procedure: RIGHT INGUINAL HERNIA REPAIR WITH MESH;  Surgeon: Kinsinger, Arta Bruce, MD;  [facility];  Service: General;  Laterality: Right;  TAP BLOCK  . INSERTION OF MESH Right 10/01/2017   Procedure: INSERTION OF MESH;  Surgeon: Kinsinger, Arta Bruce, MD;  [facility];  Service: General;  Laterality: Right;  . TONSILLECTOMY    . TONSILLECTOMY    . VIDEO BRONCHOSCOPY Bilateral 07/26/2015   Procedure: VIDEO BRONCHOSCOPY WITH FLUORO;  Surgeon: [name], MD;  [facility];  Service: Cardiopulmonary;  Laterality: Bilateral;  . VIDEO BRONCHOSCOPY WITH ENDOBRONCHIAL NAVIGATION N/A 08/23/2015   Procedure: VIDEO BRONCHOSCOPY WITH ENDOBRONCHIAL NAVIGATION;  Surgeon: Grace Isaac, MD;  Location: [facility];  Service: Thoracic;  Laterality: N/A;  . VIDEO BRONCHOSCOPY WITH ENDOBRONCHIAL ULTRASOUND N/A 08/23/2015   Procedure: VIDEO BRONCHOSCOPY WITH ENDOBRONCHIAL ULTRASOUND;  Surgeon: Grace Isaac, MD;  [facility];  Service: Thoracic;  Laterality: N/A;    Family History  Problem Relation Age of Onset  . Leukemia Father   . Emphysema Father   . Learning disabilities Son   . Leukemia Other   . Stroke Other     Allergies  Allergen Reactions  . Iodine Other (See Comments)    neck swells    Current Outpatient Medications on File Prior to Visit  Medication Sig Dispense Refill  . albuterol (PROVENTIL HFA;VENTOLIN HFA) 108 (90 Base) MCG/ACT inhaler Inhale 2 puffs into the lungs every 6 (six) hours as needed for wheezing or shortness of breath. 1 Inhaler 1  . apixaban (ELIQUIS) 5 MG TABS tablet Take 1 tablet (5 mg total) by mouth 2 (two) times daily. 180 tablet 3  . budesonide-formoterol (SYMBICORT) 160-4.5 MCG/ACT inhaler Inhale 2 puffs into the lungs 2 (two) times daily. 10.2 g 5  . diltiazem (CARDIZEM CD) 120 MG 24 hr capsule Take 1 capsule (120 mg total) by mouth daily. 90 capsule 3  . HYDROcodone-acetaminophen (NORCO/VICODIN) 5-325 MG tablet Take 1 tablet by mouth every 6 (six) hours as needed for moderate pain. 30 tablet 0  . omeprazole (PRILOSEC) 40 MG capsule TAKE 1 CAPSULE (40 MG TOTAL) BY MOUTH DAILY. PATIENT NEEDS OFFICE VISIT FOR FURTHER REFILLS 30 capsule 5  . pravastatin (PRAVACHOL) 80 MG tablet Take 1 tablet (80 mg total) by  mouth at bedtime. 30 tablet 5   No current facility-administered medications on file prior to visit.     BP 122/66 (BP Location: Left Arm, Patient Position: Sitting, Cuff Size: Normal)   Pulse 70   Temp (!) 97.5 F (36.4 C) (Oral)   Resp 18   Wt 171 lb 12.8 oz (77.9 kg)  SpO2 96%   BMI 26.12 kg/m       Objective:   Physical Exam  Constitutional: He is oriented to person, place, and time. He appears well-developed and well-nourished. No distress.  HENT:  Head: Normocephalic and atraumatic.  Cardiovascular: Normal rate and regular rhythm.  No murmur heard. Pulmonary/Chest: Effort normal and breath sounds normal. No respiratory distress. He has no wheezes. He has no rales.  Musculoskeletal: He exhibits no edema.  Neurological: He is alert and oriented to person, place, and time.  Reflex Scores:      Bicep reflexes are 2+ on the right side and 2+ on the left side.      Brachioradialis reflexes are 2+ on the right side and 2+ on the left side. Bilateral UE strength is 5/5  Skin: Skin is warm and dry.  Non-tender mobile skin nodule left forearm approx 1 cm wide.   Psychiatric: He has a normal mood and affect. His behavior is normal. Thought content normal.          Assessment & Plan:  Skin cyst- offered derm referral. Pt declines.   Cervical radiculopathy- unchanged. Rx with medrol pak. If not improvement, consider MRI.

## 2018-04-17 NOTE — Patient Instructions (Signed)
Please begin medrol dose pak (steroid). Call if symptoms worsen or if symptoms fail to improve in 1 week.

## 2018-04-21 MED FILL — ELIQUIS 5 MG TABLET: 5 | 30 days supply | Qty: 60 | Fill #8

## 2018-04-21 MED FILL — PRAVASTATIN SODIUM 80 MG TA: 80 | 30 days supply | Qty: 30 | Fill #1

## 2018-04-27 ENCOUNTER — Encounter: Payer: Self-pay | Admitting: Family

## 2018-04-27 ENCOUNTER — Ambulatory Visit (INDEPENDENT_AMBULATORY_CARE_PROVIDER_SITE_OTHER): Payer: Medicare Other | Admitting: Family

## 2018-04-27 VITALS — BP 119/88 | HR 53 | Temp 97.9°F | Resp 16 | Ht 68.0 in | Wt 169.0 lb

## 2018-04-27 DIAGNOSIS — M2550 Pain in unspecified joint: Secondary | ICD-10-CM | POA: Diagnosis not present

## 2018-04-27 DIAGNOSIS — R5383 Other fatigue: Secondary | ICD-10-CM | POA: Diagnosis not present

## 2018-04-27 DIAGNOSIS — W57XXXA Bitten or stung by nonvenomous insect and other nonvenomous arthropods, initial encounter: Secondary | ICD-10-CM

## 2018-04-27 DIAGNOSIS — M791 Myalgia, unspecified site: Secondary | ICD-10-CM

## 2018-04-27 DIAGNOSIS — R509 Fever, unspecified: Secondary | ICD-10-CM

## 2018-04-27 NOTE — Progress Notes (Addendum)
Subjective:    Patient ID: Robert Bates, male    DOB: [birth year], 77 y.o.   MRN: [id number]  HPI   Robert Bates is a 77 yr old male who presents today with c/o myalgia which has been present x 6 days. Reports that he had diarrhea last Wednesday night and again on Thursday. Reports that on Friday "I couldn't hardly walk" due to myalgia/arthralgia.  Saturday he started "running a fever" but this was subjective. Had sweating.  He reports that he has some mild dizziness.    He is eating and drinking well.    Did have a tick bite last week he believes, no obvious rash. Was washing in the shower and saw what he believes to be a tick wash off of his leg.   Review of Systems    see HPI  Past Medical History:  Diagnosis Date  . Alcoholism ([place])    past problem per pt but admits to still drinking - unclear how much  . Anxiety   . Arthritis   . 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  [name] COPD (chronic obstructive pulmonary disease) ([place])   . Diabetes type 2, controlled ([place]) 07/31/2017  . Dysrhythmia    a fib  . GERD (gastroesophageal reflux disease)   . Hematuria    refuses work up or referral - understands risks of morbidity / mortality - 11/2008, 12/2008  . History of hiatal hernia   . History of kidney stones   . Hyperlipemia   . 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 [name].   . Peripheral vascular disease ([place])    Abdominal Aortic Aneursym  . Pneumonia    as a child  . Radiation 09/18/15-10/25/15   left lower lobe 70.2 Gy  . Tobacco abuse      Social History   Socioeconomic History  . Marital status: Married    Spouse name: Not on file  . Number of children: 2  . Years of education: Not on file  . Highest education level: Not on file  Occupational History  . 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 Financial resource strain: Not on file  . Food insecurity:    Worry: Not on file    Inability: Not on file  . Transportation needs:    Medical: Not on file    Non-medical: Not on file  Tobacco Use  . Smoking status: Former Smoker    Packs/day: 1.00    Years: 57.00    Pack years: 57.00    Last attempt to quit: 08/08/2015    Years since quitting: 2.7  . Smokeless tobacco: Former Systems developer    Types: Chew    Quit date: 11/04/1958  . Tobacco comment: occasional use sneaks around  Substance and Sexual Activity  . Alcohol use: Yes    Alcohol/week: 0.0 oz    Frequency: Never    Comment: Pt reports rare use hx of denies 10-01-17  . Drug use: No  . Sexual activity: Yes  Lifestyle  . Physical activity:    Days per week: Not on file    Minutes per session: Not on file  . Stress: Not on file  Relationships  . Social connections:    Talks on phone: Not on file    Gets  together: Not on file    Attends religious service: Not on file    Active member of club or organization: Not on file    Attends meetings of clubs or organizations: Not on file    Relationship status: Not on file  . Intimate partner violence:    Fear of current or ex partner: Not on file    Emotionally abused: Not on file    Physically abused: Not on file    Forced sexual activity: Not on file  Other Topics Concern  . Not on file  Social History Narrative  . Not on file    Past Surgical History:  Procedure Laterality Date  . CHOLECYSTECTOMY N/A 07/23/2017   Procedure: LAPAROSCOPIC CHOLECYSTECTOMY;  Surgeon: Kinsinger, Arta Bruce, MD;  [facility];  Service: General;  Laterality: N/A;  . COLONOSCOPY    . EYE SURGERY Bilateral    Cataracts removed w/ lens implant  . HERNIA REPAIR     Left 36 years ago . Right inguinal hernia repair 10-01-17 Dr. Kieth Brightly  . INGUINAL HERNIA REPAIR Right 10/01/2017   Procedure: RIGHT INGUINAL HERNIA REPAIR WITH MESH;  Surgeon: Kinsinger, Arta Bruce, MD;  [facility];   Service: General;  Laterality: Right;  TAP BLOCK  . INSERTION OF MESH Right 10/01/2017   Procedure: INSERTION OF MESH;  Surgeon: Kinsinger, Arta Bruce, MD;  [facility];  Service: General;  Laterality: Right;  . TONSILLECTOMY    . TONSILLECTOMY    . VIDEO BRONCHOSCOPY Bilateral 07/26/2015   Procedure: VIDEO BRONCHOSCOPY WITH FLUORO;  Surgeon: [name], MD;  [facility];  Service: Cardiopulmonary;  Laterality: Bilateral;  . VIDEO BRONCHOSCOPY WITH ENDOBRONCHIAL NAVIGATION N/A 08/23/2015   Procedure: VIDEO BRONCHOSCOPY WITH ENDOBRONCHIAL NAVIGATION;  Surgeon: Grace Isaac, MD;  Location: [facility];  Service: Thoracic;  Laterality: N/A;  . VIDEO BRONCHOSCOPY WITH ENDOBRONCHIAL ULTRASOUND N/A 08/23/2015   Procedure: VIDEO BRONCHOSCOPY WITH ENDOBRONCHIAL ULTRASOUND;  Surgeon: Grace Isaac, MD;  [facility];  Service: Thoracic;  Laterality: N/A;    Family History  Problem Relation Age of Onset  . Leukemia Father   . Emphysema Father   . Learning disabilities Son   . Leukemia Other   . Stroke Other     Allergies  Allergen Reactions  . Iodine Other (See Comments)    neck swells    Current Outpatient Medications on File Prior to Visit  Medication Sig Dispense Refill  . albuterol (PROVENTIL HFA;VENTOLIN HFA) 108 (90 Base) MCG/ACT inhaler Inhale 2 puffs into the lungs every 6 (six) hours as needed for wheezing or shortness of breath. 1 Inhaler 1  . apixaban (ELIQUIS) 5 MG TABS tablet Take 1 tablet (5 mg total) by mouth 2 (two) times daily. 180 tablet 3  . budesonide-formoterol (SYMBICORT) 160-4.5 MCG/ACT inhaler Inhale 2 puffs into the lungs 2 (two) times daily. 10.2 g 5  . diltiazem (CARDIZEM CD) 120 MG 24 hr capsule Take 1 capsule (120 mg total) by mouth daily. 90 capsule 3  . HYDROcodone-acetaminophen (NORCO/VICODIN) 5-325 MG tablet Take 1 tablet by mouth every 6 (six) hours as needed for moderate pain. 30 tablet 0  . methylPREDNISolone (MEDROL DOSEPAK) 4 MG  TBPK tablet Take per package direction 21 tablet 0  . omeprazole (PRILOSEC) 40 MG capsule TAKE 1 CAPSULE (40 MG TOTAL) BY MOUTH DAILY. PATIENT NEEDS OFFICE VISIT FOR FURTHER REFILLS 30 capsule 5  . pravastatin (PRAVACHOL) 80 MG tablet Take 1 tablet (80 mg total) by mouth at  bedtime. 30 tablet 5   No current facility-administered medications on file prior to visit.     BP 119/88 (BP Location: Right Arm, Patient Position: Sitting, Cuff Size: Small)   Pulse (!) 53   Temp 97.9 F (36.6 C) (Oral)   Resp 16   Ht 5\' 8"  (1.727 m)   Wt 169 lb (76.7 kg)   SpO2 99%   BMI 25.70 kg/m    Objective:   Physical Exam  Constitutional: He is oriented to person, place, and time. He appears well-developed and well-nourished. No distress.  HENT:  Head: Normocephalic and atraumatic.  Cardiovascular: Normal rate and regular rhythm.  No murmur heard. Pulmonary/Chest: Effort normal and breath sounds normal. No respiratory distress. He has no wheezes. He has no rales.  Abdominal: Soft. Bowel sounds are normal.  Mild suprapubic tenderness without guarding  Musculoskeletal: He exhibits no edema.  Neurological: He is alert and oriented to person, place, and time.  Skin: Skin is warm and dry.  Psychiatric: He has a normal mood and affect. His behavior is normal. Thought content normal.          Assessment & Plan:  Myalgia/Arthralgia, suspect resolving Viral gastroenteritis. Will check CMET, CBC. TSH. UA and culture as well as a CK level.   Due to recent fever and report of tick bite will also screen for lyme disease.    Pt is advised as follows: Drink lots of fluids and get plenty of rest. Call if symptoms worsen or if they do not continue to improve. Go to the ER if you develop fever >101 or if you develop worsening abdominal pain or weakness.  Let me know if you are not feeling better in 2-3 days.

## 2018-04-27 NOTE — Patient Instructions (Addendum)
Drink lots of fluids and get plenty of rest. Call if symptoms worsen or if they do not continue to improve. Go to the ER if you develop fever >101 or if you develop worsening abdominal pain or weakness.  Let me know if you are not feeling better in 2-3 days.

## 2018-04-28 LAB — COMPREHENSIVE METABOLIC PANEL
ALT: 13 U/L (ref 0–53)
AST: 10 U/L (ref 0–37)
Albumin: 3.6 g/dL (ref 3.5–5.2)
Alkaline Phosphatase: 81 U/L (ref 39–117)
BUN: 16 mg/dL (ref 6–23)
CO2: 30 mEq/L (ref 19–32)
Calcium: 9.4 mg/dL (ref 8.4–10.5)
Chloride: 101 mEq/L (ref 96–112)
Creatinine, Ser: 0.82 mg/dL (ref 0.40–1.50)
GFR: 96.87 mL/min (ref >60.00)
Glucose, Bld: 113 mg/dL — ABNORMAL HIGH (ref 70–99)
Potassium: 4.2 mEq/L (ref 3.5–5.1)
Sodium: 139 mEq/L (ref 135–145)
Total Bilirubin: 0.5 mg/dL (ref 0.2–1.2)
Total Protein: 6.1 g/dL (ref 6.0–8.3)

## 2018-04-28 LAB — URINALYSIS, ROUTINE W REFLEX MICROSCOPIC
Bilirubin Urine: NEGATIVE
Ketones, ur: NEGATIVE
Leukocytes, UA: NEGATIVE
Nitrite: NEGATIVE
Specific Gravity, Urine: 1.015 (ref 1.000–1.030)
Total Protein, Urine: NEGATIVE
Urine Glucose: NEGATIVE
Urobilinogen, UA: 0.2 (ref 0.0–1.0)
pH: 5.5 (ref 5.0–8.0)

## 2018-04-28 LAB — CBC WITH DIFFERENTIAL/PLATELET
Basophils Absolute: 0.1 10*3/uL (ref 0.0–0.1)
Basophils Relative: 0.5 % (ref 0.0–3.0)
Eosinophils Absolute: 0.1 10*3/uL (ref 0.0–0.7)
Eosinophils Relative: 1.3 % (ref 0.0–5.0)
HCT: 44.7 % (ref 39.0–52.0)
Hemoglobin: 15.1 g/dL (ref 13.0–17.0)
Lymphocytes Relative: 13.1 % (ref 12.0–46.0)
Lymphs Abs: 1.4 10*3/uL (ref 0.7–4.0)
MCHC: 33.8 g/dL (ref 30.0–36.0)
MCV: 92.3 fl (ref 78.0–100.0)
Monocytes Absolute: 1.2 10*3/uL — ABNORMAL HIGH (ref 0.1–1.0)
Monocytes Relative: 11.1 % (ref 3.0–12.0)
Neutro Abs: 8.1 10*3/uL — ABNORMAL HIGH (ref 1.4–7.7)
Neutrophils Relative %: 74 % (ref 43.0–77.0)
Platelets: 241 10*3/uL (ref 150.0–400.0)
RBC: 4.84 Mil/uL (ref 4.22–5.81)
RDW: 14.6 % (ref 11.5–15.5)
WBC: 10.9 10*3/uL — ABNORMAL HIGH (ref 4.0–10.5)

## 2018-04-28 LAB — URINE CULTURE
MICRO NUMBER:: 90751591
Result:: NO GROWTH
SPECIMEN QUALITY:: ADEQUATE

## 2018-04-28 LAB — TSH: TSH: 1.53 u[IU]/mL (ref 0.35–4.50)

## 2018-04-28 LAB — CK: Total CK: 21 U/L (ref 7–232)

## 2018-04-28 LAB — B. BURGDORFI ANTIBODIES: B burgdorferi Ab IgG+IgM: <0.9 index

## 2018-04-29 ENCOUNTER — Telehealth: Payer: Self-pay | Admitting: Family

## 2018-04-29 DIAGNOSIS — R3129 Other microscopic hematuria: Secondary | ICD-10-CM

## 2018-04-29 NOTE — Telephone Encounter (Signed)
Left message for pt to return my call. [place] for [facility] / Triage to discuss with pt.

## 2018-04-29 NOTE — Telephone Encounter (Signed)
Labs look OK but he has microscopic blood in his urine. I see that he saw urology back in 2013 when he had a kidney stone, but I want him to see them again to make sure that there is no other cause for this finding. Referral has been placed.  Is he feeling any better yet?

## 2018-05-01 NOTE — Telephone Encounter (Signed)
Notified pt and he is agreeable to proceed with referral. 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

## 2018-05-11 MED FILL — OMEPRAZOLE 40 MG CPDR: 40 | 30 days supply | Qty: 30 | Fill #2

## 2018-05-11 MED FILL — CARTIA XT 120 MG CAPSULE SA: 120 | 90 days supply | Qty: 90 | Fill #2

## 2018-05-14 ENCOUNTER — Encounter: Payer: Self-pay | Admitting: Physician Assistant

## 2018-05-29 MED FILL — PRAVASTATIN SODIUM 80 MG TA: 80 | 30 days supply | Qty: 30 | Fill #2

## 2018-05-29 MED FILL — ELIQUIS 5 MG TABLET: 5 | 30 days supply | Qty: 60 | Fill #9

## 2018-06-02 ENCOUNTER — Telehealth: Payer: Self-pay | Admitting: *Deleted

## 2018-06-02 MED ORDER — OMEPRAZOLE 40 MG PO CPDR: 40.0000 mg | DELAYED_RELEASE_CAPSULE | Freq: Every day | ORAL | 1 refills | Status: DC

## 2018-06-02 MED ORDER — PRAVASTATIN SODIUM 80 MG PO TABS: 80.0000 mg | ORAL_TABLET | Freq: Every day | ORAL | 1 refills | Status: DC

## 2018-06-02 MED ORDER — BUDESONIDE-FORMOTEROL FUMARATE 160-4.5 MCG/ACT IN AERO: 2.0000 | INHALATION_SPRAY | Freq: Two times a day (BID) | RESPIRATORY_TRACT | 1 refills | Status: DC

## 2018-06-02 NOTE — Telephone Encounter (Addendum)
Received fax from [facility] for 'Tier Cost Sharing Exception Request Form' as well as refill request of symbicort. I completed as much of the Exception form as possible and placed in PCP's red folder for completion / signature. Refill sent to [facility]. Also received requests for omeprazole and pravastatin. Rxs sent.

## 2018-06-03 ENCOUNTER — Ambulatory Visit: Payer: Medicare Other | Admitting: Physician Assistant

## 2018-06-08 NOTE — Telephone Encounter (Signed)
Completed PA form faxed to [phone]. Awaiting approval / denial then can send symbicort refill.

## 2018-06-09 MED ORDER — FLUTICASONE-SALMETEROL 250-50 MCG/DOSE IN AEPB: 1.0000 | INHALATION_SPRAY | Freq: Two times a day (BID) | RESPIRATORY_TRACT | 2 refills | Status: DC

## 2018-06-09 NOTE — Telephone Encounter (Signed)
Spoke with pt and notified him of below. He voices understanding. States he thinks all of his medication will be cheaper if it goes to mail order. Rx was sent to mail order. He wanted to let PCP know that his spouse was recently in the hospital and has found out that she has "a bad heart, possible lung cancer and they are unsure what they will be able to do for her". He states he is feeling pretty down about this and appreciates our help with cost savings as much as possible.

## 2018-06-09 NOTE — Telephone Encounter (Signed)
Received denial from [facility] to consider symbicort for lower copay tier exception. "There are no brand name drugs in the lower cost sharing tier to treat pt's condition. Therefore, your drug is excluded from an exception to cost sharing tier. The drug is still on pt's plan drug list and will continue to be covered at the tier 3 copay". Please advise if there are any other inhaler alternatives?

## 2018-06-09 NOTE — Telephone Encounter (Signed)
Recent a prescription for Wixela  instead of Symbicort to see if this may be cheaper for him.

## 2018-06-09 NOTE — Telephone Encounter (Signed)
Noted  

## 2018-06-25 MED FILL — PRAVASTATIN SODIUM 80 MG TA: 80 | 30 days supply | Qty: 30 | Fill #3

## 2018-06-25 MED FILL — ELIQUIS 5 MG TABLET: 5 | 30 days supply | Qty: 60 | Fill #10

## 2018-07-01 ENCOUNTER — Telehealth: Payer: Self-pay | Admitting: Family

## 2018-07-01 MED ORDER — FLUTICASONE-SALMETEROL 250-50 MCG/DOSE IN AEPB: 1.0000 | INHALATION_SPRAY | Freq: Two times a day (BID) | RESPIRATORY_TRACT | 1 refills | Status: DC

## 2018-07-01 MED ORDER — ALBUTEROL SULFATE HFA 108 (90 BASE) MCG/ACT IN AERS: 2.0000 | INHALATION_SPRAY | Freq: Four times a day (QID) | RESPIRATORY_TRACT | 0 refills | Status: DC | PRN

## 2018-07-01 MED FILL — WIXELA INHUB 250-50 MCG/DOS: 250-50 | 30 days supply | Qty: 60 | Fill #0

## 2018-07-01 MED FILL — PROAIR HFA 90 MCG INHALER: 108 (90 BAS | 25 days supply | Qty: 9 | Fill #0

## 2018-07-01 NOTE — Telephone Encounter (Signed)
Copied from [place] [phone]. Topic: Quick Communication - Rx Refill/Question >> Jul 01, 2018 12:38 PM Synthia Innocent wrote: Medication:Fluticasone-Salmeterol [facility] IN[facility]UB) 250-50 MCG/DOSE and albuterol (PROVENTIL HFA;VENTOLIN HFA) 108 (90 Base) MCG/ACT inhaler   Has the patient contacted their pharmacy? Yes.   (Agent: If no, request that the patient contact the pharmacy for the refill.) (Agent: If yes, when and what did the pharmacy advise?)  Preferred Pharmacy (with phone number or street name): [facility]: Please be advised that RX refills may take up to 3 business days. We ask that you follow-up with your pharmacy.  Patient is requesting call once sent

## 2018-07-01 NOTE — Addendum Note (Signed)
Addended by: Addison Naegeli on: 07/01/2018 03:15 PM   Modules accepted: Orders

## 2018-07-08 ENCOUNTER — Encounter: Payer: Self-pay | Admitting: *Deleted

## 2018-07-08 ENCOUNTER — Encounter: Payer: Self-pay | Admitting: Physician Assistant

## 2018-07-08 ENCOUNTER — Ambulatory Visit (HOSPITAL_BASED_OUTPATIENT_CLINIC_OR_DEPARTMENT_OTHER)
Admission: RE | Admit: 2018-07-08 | Discharge: 2018-07-08 | Disposition: A | Discharge status: Home / Self Care | Payer: Medicare Other | Source: Ambulatory Visit | Attending: Internal Medicine | Admitting: Internal Medicine

## 2018-07-08 ENCOUNTER — Ambulatory Visit: Payer: Medicare Other | Admitting: Physician Assistant

## 2018-07-08 VITALS — BP 114/72 | HR 65 | Ht 68.0 in | Wt 167.8 lb

## 2018-07-08 DIAGNOSIS — Z720 Tobacco use: Secondary | ICD-10-CM | POA: Diagnosis not present

## 2018-07-08 DIAGNOSIS — I700 Atherosclerosis of aorta: Secondary | ICD-10-CM | POA: Diagnosis not present

## 2018-07-08 DIAGNOSIS — C3490 Malignant neoplasm of unspecified part of unspecified bronchus or lung: Secondary | ICD-10-CM | POA: Diagnosis present

## 2018-07-08 DIAGNOSIS — I714 Abdominal aortic aneurysm, without rupture, unspecified: Secondary | ICD-10-CM

## 2018-07-08 DIAGNOSIS — J439 Emphysema, unspecified: Secondary | ICD-10-CM | POA: Insufficient documentation

## 2018-07-08 DIAGNOSIS — Z923 Personal history of irradiation: Secondary | ICD-10-CM | POA: Diagnosis not present

## 2018-07-08 DIAGNOSIS — J449 Chronic obstructive pulmonary disease, unspecified: Secondary | ICD-10-CM

## 2018-07-08 DIAGNOSIS — S2232XA Fracture of one rib, left side, initial encounter for closed fracture: Secondary | ICD-10-CM | POA: Diagnosis not present

## 2018-07-08 DIAGNOSIS — J90 Pleural effusion, not elsewhere classified: Secondary | ICD-10-CM | POA: Insufficient documentation

## 2018-07-08 DIAGNOSIS — I10 Essential (primary) hypertension: Secondary | ICD-10-CM

## 2018-07-08 DIAGNOSIS — I480 Paroxysmal atrial fibrillation: Secondary | ICD-10-CM | POA: Diagnosis not present

## 2018-07-08 DIAGNOSIS — X58XXXA Exposure to other specified factors, initial encounter: Secondary | ICD-10-CM | POA: Diagnosis not present

## 2018-07-08 MED ORDER — FUROSEMIDE 20 MG PO TABS: 20.0000 mg | ORAL_TABLET | Freq: Every day | ORAL | 1 refills | Status: DC | PRN

## 2018-07-08 MED FILL — FUROSEMIDE 20 MG TAB: 20 | 30 days supply | Qty: 30 | Fill #0

## 2018-07-08 NOTE — Progress Notes (Signed)
Cardiology Office Note    Date:  07/08/2018   ID:  Robert Bates, DOB [birth year], MRN [id number]  PCP:  [name], NP  Cardiologist: Dr. Acie Fredrickson   Chief Complaint: 10 Months follow up  History of Present Illness:   Robert Bates is a 77 y.o. male with hx of PAF, chronic diastolic CHF, tobacco abuse, lung cancer, COPD, hypertension presents for follow up.   He was admitted 9/17-9/20 with acute cholecystitis and AFib w/ RVR. He spontaneously converted to normal sinus rhythm, but continued to have runs of AFib/Flutter w/ RVR.   He had an echo that demonstrated normal ejection fraction.  He underwent cholecystectomy.  He was then placed on Apixaban for anticoagulation.  CHADS2-VASc=4 (age, HTN, vascular dz).  He was also placed on Diltiazem for rate control     He cannot tell when he is in A. fib or in normal sinus rhythm.  Echo 07/2017 showed normal LVEF with grade 2 DD. Last seen by Dr. Acie Fredrickson 09/2017.  Here for follow-up.  Currently drives truck.  He wants to change to another company however he needs letter stating that he is good from heart standpoint.  He has chronic lower extremity edema.  Endorses eating high salt.  Has stable dyspnea on exertion without chest tightness or heaviness.  He denies chest pain, palpitation, dizziness, syncope, melena or blood in his stool or urine.  He has high Eliquis co-pay and asking for medication change.  Intermittent orthopnea without PND.  He is trying to quit tobacco smoking, currently smokes 1/4 pack a day.  Past Medical History:  Diagnosis Date  . Alcoholism ([place])    past problem per pt but admits to still drinking - unclear how much  . Anxiety   . Arthritis   . 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  [name] COPD (chronic obstructive pulmonary disease) ([place])   . Diabetes type 2, controlled ([place]) 07/31/2017  . Dysrhythmia    a fib  . GERD  (gastroesophageal reflux disease)   . Hematuria    refuses work up or referral - understands risks of morbidity / mortality - 11/2008, 12/2008  . History of hiatal hernia   . History of kidney stones   . Hyperlipemia   . Meningioma ([place]) 10/25/2013   Follows with Dr. [name].   . Peripheral vascular disease ([place])    Abdominal Aortic Aneursym  . Pneumonia    as a child  . Radiation 09/18/15-10/25/15   left lower lobe 70.2 Gy  . Tobacco abuse     Past Surgical History:  Procedure Laterality Date  . CHOLECYSTECTOMY N/A 07/23/2017   Procedure: LAPAROSCOPIC CHOLECYSTECTOMY;  Surgeon: Kinsinger, Arta Bruce, MD;  Location: [facility];  Service: General;  Laterality: N/A;  . COLONOSCOPY    . EYE SURGERY Bilateral    Cataracts removed w/ lens implant  . HERNIA REPAIR     Left 36 years ago . Right inguinal hernia repair 10-01-17 Dr. Kieth Brightly  . INGUINAL HERNIA REPAIR Right 10/01/2017   Procedure: RIGHT INGUINAL HERNIA REPAIR WITH MESH;  Surgeon: Kinsinger, Arta Bruce, MD;  [facility]: [facility];  Service: General;  Laterality: Right;  TAP BLOCK  . INSERTION OF MESH Right 10/01/2017   Procedure: INSERTION OF MESH;  Surgeon: Kinsinger, Arta Bruce, MD;  [facility]: [facility];  Service: General;  Laterality: Right;  . TONSILLECTOMY    . TONSILLECTOMY    .  VIDEO BRONCHOSCOPY Bilateral 07/26/2015   Procedure: VIDEO BRONCHOSCOPY WITH FLUORO;  Surgeon: [name], MD;  [facility];  Service: Cardiopulmonary;  Laterality: Bilateral;  . VIDEO BRONCHOSCOPY WITH ENDOBRONCHIAL NAVIGATION N/A 08/23/2015   Procedure: VIDEO BRONCHOSCOPY WITH ENDOBRONCHIAL NAVIGATION;  Surgeon: Grace Isaac, MD;  Location: [facility];  Service: Thoracic;  Laterality: N/A;  . VIDEO BRONCHOSCOPY WITH ENDOBRONCHIAL ULTRASOUND N/A 08/23/2015   Procedure: VIDEO BRONCHOSCOPY WITH ENDOBRONCHIAL ULTRASOUND;  Surgeon: Grace Isaac, MD;  [facility];  Service: Thoracic;  Laterality: N/A;    Current Medications: Prior  to Admission medications   Medication Sig Start Date End Date Taking? Authorizing Provider  albuterol (PROVENTIL HFA;VENTOLIN HFA) 108 (90 Base) MCG/ACT inhaler Inhale 2 puffs into the lungs every 6 (six) hours as needed for wheezing or shortness of breath. 07/01/18   [name], NP  apixaban (ELIQUIS) 5 MG TABS tablet Take 1 tablet (5 mg total) by mouth 2 (two) times daily. 08/15/17 09/22/18  Richardson Dopp T, PA-C  diltiazem (CARDIZEM CD) 120 MG 24 hr capsule Take 1 capsule (120 mg total) by mouth daily. 10/21/17   Richardson Dopp T, PA-C  Fluticasone-Salmeterol (WIXELA INHUB) 250-50 MCG/DOSE AEPB Inhale 1 puff into the lungs 2 (two) times daily. 07/01/18   [name], NP  HYDROcodone-acetaminophen (NORCO/VICODIN) 5-325 MG tablet Take 1 tablet by mouth every 6 (six) hours as needed for moderate pain. 10/01/17   Kinsinger, Arta Bruce, MD  omeprazole (PRILOSEC) 40 MG capsule Take 1 capsule (40 mg total) by mouth daily. 06/02/18   [name], NP  pravastatin (PRAVACHOL) 80 MG tablet Take 1 tablet (80 mg total) by mouth at bedtime. 06/02/18   [name], NP    Allergies:   Iodine   Social History   Socioeconomic History  . Marital status: Married    Spouse name: Not on file  . Number of children: 2  . Years of education: Not on file  . Highest education level: Not on file  Occupational History  . Occupation: Retired    Fish farm manager: [employer]    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[place]  . Financial resource strain: Not on file  . Food insecurity:    Worry: Not on file    Inability: Not on file  . Transportation needs:    Medical: Not on file    Non-medical: Not on file  Tobacco Use  . Smoking status: Former Smoker    Packs/day: 1.00    Years: 57.00    Pack years: 57.00    Last attempt to quit: 08/08/2015    Years since quitting: 2.9  . Smokeless tobacco: Former Systems developer    Types: Chew    Quit date: 11/04/1958  . Tobacco comment:  occasional use sneaks around  Substance and Sexual Activity  . Alcohol use: Yes    Alcohol/week: 0.0 standard drinks    Frequency: Never    Comment: Pt reports rare use hx of denies 10-01-17  . Drug use: No  . Sexual activity: Yes  Lifestyle  . Physical activity:    Days per week: Not on file    Minutes per session: Not on file  . Stress: Not on file  Relationships  . Social connections:    Talks on phone: Not on file    Gets together: Not on file    Attends religious service: Not on file    Active member of club or organization: Not on file  Attends meetings of clubs or organizations: Not on file    Relationship status: Not on file  Other Topics Concern  . Not on file  Social History Narrative  . Not on file     Family History:  The patient's family history includes Emphysema in his father; Learning disabilities in his son; Leukemia in his father and other; Stroke in his other.   ROS:   Please see the history of present illness.    ROS All other systems reviewed and are negative.   PHYSICAL EXAM:   VS:  BP 114/72   Pulse 65   Ht 5\' 8"  (1.727 m)   Wt 167 lb 12.8 oz (76.1 kg)   BMI 25.51 kg/m    GEN: Well nourished, well developed, in no acute distress  HEENT: normal  Neck: no JVD, carotid bruits, or masses Cardiac: RRR; no murmurs, rubs, or gallops,no edema  Respiratory: Diffuse wheezing  GI: soft, nontender, nondistended, + BS MS: no deformity or atrophy  Skin: warm and dry, no rash Neuro:  Alert and Oriented x 3, Strength and sensation are intact Psych: euthymic mood, full affect  Wt Readings from Last 3 Encounters:  07/08/18 167 lb 12.8 oz (76.1 kg)  04/27/18 169 lb (76.7 kg)  04/17/18 171 lb 12.8 oz (77.9 kg)      Studies/Labs Reviewed:   EKG:  EKG is ordered today.  The ekg ordered today demonstrates normal sinus rhythm  Recent Labs: 07/24/2017: Magnesium 2.1 04/27/2018: ALT 13; BUN 16; Creatinine, Ser 0.82; Hemoglobin 15.1; Platelets 241.0;  Potassium 4.2; Sodium 139; TSH 1.53   Lipid Panel    Component Value Date/Time   CHOL 119 12/25/2017 0856   TRIG 100.0 12/25/2017 0856   HDL 52.80 12/25/2017 0856   CHOLHDL 2 12/25/2017 0856   VLDL 20.0 12/25/2017 0856   LDLCALC 46 12/25/2017 0856   LDLDIRECT 142.2 12/26/2008 1032    Additional studies/ records that were reviewed today include:   As above  ASSESSMENT & PLAN:    1. PAF -Continue Eliquis for anticoagulation.  No bleeding issue.  He will fill out Eliquis assistance form.  Maintaining sinus rhythm.  Continue Cardizem for rate control.  2. Chronic diastolic CHF -He has chronic lower extremity edema.  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  Encouraged leg elevation, cut back on the salt and complex.  Trial of PRN Lasix.  3.  Lung cancer/COPD -his stable dyspnea on exertion is due to pulmonary issue.  He has no chest tightness or heaviness.  Symptoms not concerning for angina.  No further cardiac work-up recommended.  4. Tobacco smoking - Trying to quit. Currently smokes 1/4 pack a day. Encouraged complete cessation.   Reviewed his driving with Dr. Acie Fredrickson.  He has no cardiac symptoms.  No active angina or CHF.  He is good to Drive truck on cardiac standpoint. F/u with Dr. Acie Fredrickson in 1 year.       Medication Adjustments/Labs and Tests Ordered: Current medicines are reviewed at length with the patient today.  Concerns regarding medicines are outlined above.  Medication changes, Labs and Tests ordered today are listed in the Patient Instructions below. Patient Instructions  Medication Instructions:  Your physician has recommended you make the following change in your medication:  1.  START Lasix 20 mg taking only as needed for lower extremity swelling   Labwork: None ordered  Testing/Procedures: None ordered  Follow-Up: Your physician wants you to follow-up in: [place] DR. Nahser  You will receive a reminder letter in the mail two months  in advance. If you don't receive a letter, please call our office to schedule the follow-up appointment.    Any Other Special Instructions Will Be Listed Below (If Applicable).     If you need a refill on your cardiac medications before your next appointment, please call your pharmacy.      Jarrett Soho, [place]  07/08/2018 10:10 AM    [facility] [address], [place], [place]  [address] Phone: [phone]; Fax: [phone]

## 2018-07-08 NOTE — Patient Instructions (Addendum)
Medication Instructions:  Your physician has recommended you make the following change in your medication:  1.  START Lasix 20 mg taking only as needed for lower extremity swelling   Labwork: None ordered  Testing/Procedures: None ordered  Follow-Up: Your physician wants you to follow-up in: [place] will receive a reminder letter in the mail two months in advance. If you don't receive a letter, please call our office to schedule the follow-up appointment.    Any Other Special Instructions Will Be Listed Below (If Applicable).     If you need a refill on your cardiac medications before your next appointment, please call your pharmacy.

## 2018-07-11 ENCOUNTER — Ambulatory Visit (HOSPITAL_BASED_OUTPATIENT_CLINIC_OR_DEPARTMENT_OTHER): Payer: Medicare Other

## 2018-07-13 ENCOUNTER — Encounter: Payer: Self-pay | Admitting: Internal Medicine

## 2018-07-13 ENCOUNTER — Inpatient Hospital Stay: Payer: Medicare Other | Attending: Internal Medicine | Admitting: Internal Medicine

## 2018-07-13 VITALS — BP 102/84 | HR 71 | Temp 98.1°F | Resp 18 | Ht 68.0 in | Wt 169.6 lb

## 2018-07-13 DIAGNOSIS — Z85118 Personal history of other malignant neoplasm of bronchus and lung: Secondary | ICD-10-CM | POA: Insufficient documentation

## 2018-07-13 DIAGNOSIS — C3432 Malignant neoplasm of lower lobe, left bronchus or lung: Secondary | ICD-10-CM

## 2018-07-13 DIAGNOSIS — C3490 Malignant neoplasm of unspecified part of unspecified bronchus or lung: Secondary | ICD-10-CM

## 2018-07-13 NOTE — Progress Notes (Signed)
[facility] [phone]   [phone]  OFFICE PROGRESS NOTE  [name], NP [place] [address]  DIAGNOSIS: Stage IIB (T3, N0, M0) non-small cell lung cancer, squamous cell carcinoma presented with left lower lobe endobronchial lesion as well as suspicious groundglass opacity in the left upper lobe diagnosed in September 2016.  PRIOR THERAPY: Curative radiotherapy to the left upper lobe lung mass under the care of Dr. Sondra Come completed 10/25/2015.  CURRENT THERAPY: Observation  INTERVAL HISTORY: Robert Bates 77 y.o. male returns to the clinic today for follow-up visit.  The patient is feeling fine today with no concerning complaints except for shortness of breath with exertion and mild wheezes.  He denied having any chest pain or cough.  He denied having any fever or chills.  He has no nausea, vomiting, diarrhea or constipation.  The patient denied having any recent weight loss or night sweats.  He had repeat CT scan of the chest performed recently and he is here for evaluation and discussion of his discuss results.  MEDICAL HISTORY: Past Medical History:  Diagnosis Date  . Alcoholism ([place])    past problem per pt but admits to still drinking - unclear how much  . Anxiety   . Arthritis   . 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  [name] COPD (chronic obstructive pulmonary disease) ([place])   . Diabetes type 2, controlled ([place]) 07/31/2017  . Dysrhythmia    a fib  . GERD (gastroesophageal reflux disease)   . Hematuria    refuses work up or referral - understands risks of morbidity / mortality - 11/2008, 12/2008  . History of hiatal hernia   . History of kidney stones   . Hyperlipemia   . Meningioma ([place]) 10/25/2013   Follows with Dr. [name].   . Peripheral vascular disease ([place])    Abdominal Aortic Aneursym  . Pneumonia      as a child  . Radiation 09/18/15-10/25/15   left lower lobe 70.2 Gy  . Tobacco abuse     ALLERGIES:  is allergic to iodine.  MEDICATIONS:  Current Outpatient Medications  Medication Sig Dispense Refill  . albuterol (PROVENTIL HFA;VENTOLIN HFA) 108 (90 Base) MCG/ACT inhaler Inhale 2 puffs into the lungs every 6 (six) hours as needed for wheezing or shortness of breath. 1 Inhaler 0  . apixaban (ELIQUIS) 5 MG TABS tablet Take 1 tablet (5 mg total) by mouth 2 (two) times daily. 180 tablet 3  . diltiazem (CARDIZEM CD) 120 MG 24 hr capsule Take 1 capsule (120 mg total) by mouth daily. 90 capsule 3  . Fluticasone-Salmeterol (WIXELA INHUB) 250-50 MCG/DOSE AEPB Inhale 1 puff into the lungs 2 (two) times daily. 60 each 1  . furosemide (LASIX) 20 MG tablet Take 1 tablet (20 mg total) by mouth daily as needed for edema. 30 tablet 1  . HYDROcodone-acetaminophen (NORCO/VICODIN) 5-325 MG tablet Take 1 tablet by mouth every 6 (six) hours as needed for moderate pain. 30 tablet 0  . omeprazole (PRILOSEC) 40 MG capsule Take 1 capsule (40 mg total) by mouth daily. 90 capsule 1  . pravastatin (PRAVACHOL) 80 MG tablet Take 1 tablet (80 mg total) by mouth at bedtime. 90 tablet 1   No current facility-administered medications for this visit.     SURGICAL HISTORY:  Past Surgical History:  Procedure  Laterality Date  . CHOLECYSTECTOMY N/A 07/23/2017   Procedure: LAPAROSCOPIC CHOLECYSTECTOMY;  Surgeon: Kinsinger, Arta Bruce, MD;  [facility];  Service: General;  Laterality: N/A;  . COLONOSCOPY    . EYE SURGERY Bilateral    Cataracts removed w/ lens implant  . HERNIA REPAIR     Left 36 years ago . Right inguinal hernia repair 10-01-17 Dr. Kieth Brightly  . INGUINAL HERNIA REPAIR Right 10/01/2017   Procedure: RIGHT INGUINAL HERNIA REPAIR WITH MESH;  Surgeon: Kinsinger, Arta Bruce, MD;  [facility];  Service: General;  Laterality: Right;  TAP BLOCK  . INSERTION OF MESH Right 10/01/2017   Procedure:  INSERTION OF MESH;  Surgeon: Kinsinger, Arta Bruce, MD;  [facility];  Service: General;  Laterality: Right;  . TONSILLECTOMY    . TONSILLECTOMY    . VIDEO BRONCHOSCOPY Bilateral 07/26/2015   Procedure: VIDEO BRONCHOSCOPY WITH FLUORO;  Surgeon: [name], MD;  [facility];  Service: Cardiopulmonary;  Laterality: Bilateral;  . VIDEO BRONCHOSCOPY WITH ENDOBRONCHIAL NAVIGATION N/A 08/23/2015   Procedure: VIDEO BRONCHOSCOPY WITH ENDOBRONCHIAL NAVIGATION;  Surgeon: Grace Isaac, MD;  Location: [facility];  Service: Thoracic;  Laterality: N/A;  . VIDEO BRONCHOSCOPY WITH ENDOBRONCHIAL ULTRASOUND N/A 08/23/2015   Procedure: VIDEO BRONCHOSCOPY WITH ENDOBRONCHIAL ULTRASOUND;  Surgeon: Grace Isaac, MD;  Location: [facility];  Service: Thoracic;  Laterality: N/A;    REVIEW OF SYSTEMS:  A comprehensive review of systems was negative except for: Respiratory: positive for cough and wheezing   PHYSICAL EXAMINATION: General appearance: alert, cooperative and no distress Head: Normocephalic, without obvious abnormality, atraumatic Neck: no adenopathy, no JVD, supple, symmetrical, trachea midline and thyroid not enlarged, symmetric, no tenderness/mass/nodules Lymph nodes: Cervical, supraclavicular, and axillary nodes normal. Resp: clear to auscultation bilaterally Back: symmetric, no curvature. ROM normal. No CVA tenderness. Cardio: regular rate and rhythm, S1, S2 normal, no murmur, click, rub or gallop GI: soft, non-tender; bowel sounds normal; no masses,  no organomegaly Extremities: extremities normal, atraumatic, no cyanosis or edema  ECOG PERFORMANCE STATUS: 1 - Symptomatic but completely ambulatory  Blood pressure 102/84, pulse 71, temperature 98.1 F (36.7 C), temperature source Oral, resp. rate 18, height 5\' 8"  (1.727 m), weight 169 lb 9.6 oz (76.9 kg), SpO2 97 %.  LABORATORY DATA: Lab Results  Component Value Date   WBC 10.9 (H) 04/27/2018   HGB 15.1 04/27/2018   HCT 44.7  04/27/2018   MCV 92.3 04/27/2018   PLT 241.0 04/27/2018      Chemistry      Component Value Date/Time   NA 139 04/27/2018 1509   NA 140 06/17/2017 1315   K 4.2 04/27/2018 1509   K 4.5 06/17/2017 1315   CL 101 04/27/2018 1509   CO2 30 04/27/2018 1509   CO2 28 06/17/2017 1315   BUN 16 04/27/2018 1509   BUN 18.1 06/17/2017 1315   CREATININE 0.82 04/27/2018 1509   CREATININE 0.8 06/17/2017 1315      Component Value Date/Time   CALCIUM 9.4 04/27/2018 1509   CALCIUM 9.8 06/17/2017 1315   ALKPHOS 81 04/27/2018 1509   ALKPHOS 82 06/17/2017 1315   AST 10 04/27/2018 1509   AST 16 06/17/2017 1315   ALT 13 04/27/2018 1509   ALT 10 06/17/2017 1315   BILITOT 0.5 04/27/2018 1509   BILITOT 0.47 06/17/2017 1315       RADIOGRAPHIC STUDIES: Ct Chest Wo Contrast  Result Date: 07/08/2018 CLINICAL DATA:  History of left lower lobe squamous cell carcinoma and  left upper lobe adenocarcinoma. EXAM: CT CHEST WITHOUT CONTRAST TECHNIQUE: Multidetector CT imaging of the chest was performed following the standard protocol without IV contrast. COMPARISON:  Chest CT 01/02/2018 and 06/22/2017. FINDINGS: Cardiovascular: Prominent coronary artery atherosclerosis again noted. There is lesser atherosclerosis of the aorta and great vessels. No acute vascular findings on noncontrast imaging. The heart size is normal. There is a small pericardial effusion or pericardial thickening, similar to previous study. Mediastinum/Nodes: Mildly prominent mediastinal lymph nodes are stable, including a 10 mm right paratracheal node on image 64/2 and a subcarinal node measuring 13 mm on image 76/2. No progressive adenopathy identified. Hilar assessment is limited by the lack of intravenous contrast, although the hilar contours appear unchanged.There is a moderate size hiatal hernia. The thyroid gland and trachea appear unremarkable. Lungs/Pleura: There is a stable small dependent left pleural effusion. There is stable distortion of  the left hilum with architectural distortion, surrounding fibrosis and architectural distortion, attributed to previous radiation therapy. No recurrent mass lesion identified. There are no suspicious pulmonary nodules. Underlying mild emphysema and right apical scarring are stable. Upper abdomen: The visualized upper abdomen appears stable without suspicious findings. There are stable low-density hepatic lesions most consistent with cysts. There is a stable small left adrenal adenoma. Musculoskeletal/Chest wall: Mildly displaced fracture of the left 3rd rib laterally appears unchanged, likely pathologic (related to prior radiation therapy). No bone destruction or associated soft tissue mass. No new osseous lesions. IMPRESSION: 1. Overall, no significant changes are identified. There are stable radiation changes in the left perihilar region with architectural distortion. 2. Small mediastinal lymph nodes are stable, likely reactive. No definite local recurrence or metastatic disease. 3. Stable small left pleural effusion. 4. Stable fracture of the left 3rd rib laterally. 5. Aortic Atherosclerosis (ICD10-I70.0) and Emphysema (ICD10-J43.9). Electronically Signed   By: Richardean Sale M.D.   On: 07/08/2018 17:11    ASSESSMENT AND PLAN:  This is a very pleasant 77 years old white male with a stage IIB non-small cell lung cancer, squamous cell carcinoma presented with left upper lobe endobronchial lesion in addition to left upper lobe suspicious groundglass opacity. He is status post curative radiotherapy under the care of Dr. Sondra Come. The patient has been in observation since that time. Repeat CT scan of the chest performed recently showed no concerning findings for disease recurrence or progression. I discussed the scan results with the patient and recommended for him to continue on observation with repeat CT scan of the chest in 1 year. He was advised to call immediately if he has any concerning symptoms in the  interval. The patient voices understanding of current disease status and treatment options and is in agreement with the current care plan. All questions were answered. The patient knows to call the clinic with any problems, questions or concerns. We can certainly see the patient much sooner if necessary. I spent 10 minutes counseling the patient face to face. The total time spent in the appointment was 15 minutes.  Disclaimer: This note was dictated with voice recognition software. Similar sounding words can inadvertently be transcribed and may not be corrected upon review.

## 2018-08-04 MED FILL — ELIQUIS 5 MG TABLET: 5 | 30 days supply | Qty: 60 | Fill #11

## 2018-08-07 ENCOUNTER — Ambulatory Visit (INDEPENDENT_AMBULATORY_CARE_PROVIDER_SITE_OTHER): Payer: Medicare Other

## 2018-08-07 DIAGNOSIS — Z23 Encounter for immunization: Secondary | ICD-10-CM

## 2018-08-07 MED FILL — PRAVASTATIN SODIUM 80 MG TA: 80 | 30 days supply | Qty: 30 | Fill #4

## 2018-08-14 MED FILL — CARTIA XT 120 MG CP24: 120 | 90 days supply | Qty: 90 | Fill #3

## 2018-09-04 ENCOUNTER — Other Ambulatory Visit: Payer: Self-pay | Admitting: Physician Assistant

## 2018-09-04 MED FILL — ELIQUIS 5 MG TABLET: 5 | 30 days supply | Qty: 60 | Fill #0

## 2018-09-04 MED FILL — OMEPRAZOLE 40 MG CPDR: 40 | 30 days supply | Qty: 30 | Fill #3

## 2018-09-11 ENCOUNTER — Telehealth: Payer: Self-pay

## 2018-09-11 NOTE — Telephone Encounter (Signed)
Copied from [place] [phone]. Topic: General - Other >> Sep 11, 2018  1:21 PM Janace Aris A wrote: Reason for CRM: pt called in, wanting to have another type of albuterol  sent into the pharmacy. He says he would prefer something a little less expensive.[name] He says the one his provider sent in previously was on the expensive side.   Please advise

## 2018-09-12 NOTE — Telephone Encounter (Signed)
Only thing cheaper would be albuterol nebs but he will need a machine.  Also, he could see if his insurance has a preferred brand for albuterol and then call us with that information.  Let me know his preference.

## 2018-09-14 ENCOUNTER — Telehealth: Payer: Self-pay | Admitting: Family

## 2018-09-14 NOTE — Telephone Encounter (Signed)
Pt returning call from office. Note of Robert Alar NP read to patient. Pt expressed concern that nebulizer he would not be able to use at work. He agrees to check with his insurance to see what their prefered brand of albuterol might be. Pt was instructed to call back.  Pt verbalized understanding of all information.

## 2018-09-14 NOTE — Telephone Encounter (Signed)
Left message on pt's cell voicemail to return my call. [place] for [name] / triage to discuss with pt.

## 2018-09-17 ENCOUNTER — Other Ambulatory Visit: Payer: Self-pay | Admitting: Family

## 2018-09-17 MED FILL — PRAVASTATIN SODIUM 80 MG TA: 80 | 30 days supply | Qty: 30 | Fill #5

## 2018-09-17 NOTE — Telephone Encounter (Signed)
[employer] called in wanting to give verbal confirmation that the nebulizer that the pt was inquiring on getting sent to the pharmacy is covered 80/20 by insurance.   Also that if we needed a PA for RX we could call the number [phone]. If not than we can initiate the RX order for the nebulizer.

## 2018-09-18 MED FILL — PROAIR HFA 90 MCG INHALER: 108 (90 BAS | 25 days supply | Qty: 9 | Fill #0

## 2018-09-30 ENCOUNTER — Ambulatory Visit: Payer: Self-pay

## 2018-09-30 NOTE — Telephone Encounter (Signed)
   Answer Assessment - Initial Assessment Questions 1. MECHANISM: "How did the injury happen?" (Consider the possibility of domestic violence or elder abuse)     *No Answer* 2. ONSET: "When did the injury happen?" (Minutes or hours ago)     *No Answer* 3. LOCATION: "What part of the neck is injured?"     *No Answer* 4. SEVERITY: "Can you move the neck normally?"     *No Answer* 5. PAIN: "Is there any pain?" If so, ask: "How bad is the pain?"   (Scale 1-10; or mild, moderate, severe)    Moderate  Especially when turn to right 6. CORD SYMPTOMS: "Any weakness or numbness of the arms or legs?"     *No Answer* 7. SIZE: For cuts, bruises, or swelling, ask: "How large is it?" (e.g., inches or centimeters)      *No Answer* 8. TETANUS: For any breaks in the skin, ask: "When was the last tetanus booster?"     *No Answer* 9. OTHER SYMPTOMS: "Do you have any other symptoms?" (e.g., headache)     *No Answer* 10. PREGNANCY: "Is there any chance you are pregnant?" "When was your last menstrual period?"       *No Answer*  Protocols used: NECK INJURY-A-AH

## 2018-09-30 NOTE — Telephone Encounter (Signed)
Outgoing call to Patient who complains of neck pain.  States  that Dr.  [name]

## 2018-09-30 NOTE — Telephone Encounter (Signed)
Patient states that Dr.  Inda Castle is aware of his diagnosis.  Has had an xray in the past.  Patient states that he is not having any improvement with otc medications such as Tylenol.  Since the last two weeks the pain has increased.  Especially when he turns his head to the right.  Patient inquires if something could be called I for pain to the [place] [facility] in [place]?

## 2018-10-05 MED FILL — ELIQUIS 5 MG TABLET: 5 | 30 days supply | Qty: 60 | Fill #1

## 2018-10-19 ENCOUNTER — Other Ambulatory Visit: Payer: Self-pay | Admitting: Family

## 2018-10-19 MED FILL — OMEPRAZOLE 40 MG CPDR: 40 | 30 days supply | Qty: 30 | Fill #4

## 2018-10-20 ENCOUNTER — Telehealth: Payer: Self-pay | Admitting: Family

## 2018-10-20 NOTE — Telephone Encounter (Signed)
Copied from [facility] [id number]. Topic: Quick Communication - See Telephone Encounter >> Oct 20, 2018  2:38 PM Rosalin Hawking wrote: CRM for notification. See Telephone encounter for: 10/20/18.   Pt came in office stating is needing refill on his cholesterol (asked pt the name of meds but pt could not remember the name of the medication) Pt would like to have med sent to pharmacy at [facility]. Please advise.

## 2018-10-21 MED FILL — PRAVASTATIN SODIUM 80 MG TA: 80 | 30 days supply | Qty: 30 | Fill #0

## 2018-10-21 NOTE — Telephone Encounter (Signed)
30 day supply of pravastatin sent to pharmacy. Pt was last seen 04/2018 and has no future appts scheduled. Please advise when pt is due for follow up in the office?

## 2018-10-22 NOTE — Telephone Encounter (Signed)
rx sent yesterday as pravastatin 80mg  30 tabs with no refills.

## 2018-10-23 NOTE — Telephone Encounter (Signed)
Please call pt to schedule OV with Melissa as advised below.  Thanks!   [name], NP  You 7 minutes ago (11:47 AM)   Let's bring him back in a few months please.

## 2018-10-26 NOTE — Telephone Encounter (Signed)
Pt is schedule for 11-20-2017 at 9:40 am with provider. Done

## 2018-10-30 MED FILL — PROAIR HFA 90 MCG INHALER: 108 (90 BAS | 25 days supply | Qty: 9 | Fill #1

## 2018-11-09 ENCOUNTER — Other Ambulatory Visit: Payer: Self-pay | Admitting: Physician Assistant

## 2018-11-09 ENCOUNTER — Other Ambulatory Visit: Payer: Self-pay | Admitting: Family

## 2018-11-09 MED FILL — CARTIA XT 120 MG CAPSULE: 120 | 90 days supply | Qty: 90 | Fill #0

## 2018-11-09 MED FILL — ELIQUIS 5 MG TABLET: 5 | 30 days supply | Qty: 60 | Fill #2

## 2018-11-19 ENCOUNTER — Other Ambulatory Visit: Payer: Self-pay | Admitting: Family

## 2018-11-19 MED FILL — OMEPRAZOLE 40 MG CPDR: 40 | 30 days supply | Qty: 30 | Fill #5

## 2018-11-20 ENCOUNTER — Encounter: Payer: Self-pay | Admitting: Family

## 2018-11-20 ENCOUNTER — Ambulatory Visit (INDEPENDENT_AMBULATORY_CARE_PROVIDER_SITE_OTHER): Payer: Medicare Other | Admitting: Family

## 2018-11-20 VITALS — BP 126/65 | HR 59 | Temp 97.5°F | Resp 16 | Ht 68.0 in | Wt 170.0 lb

## 2018-11-20 DIAGNOSIS — J449 Chronic obstructive pulmonary disease, unspecified: Secondary | ICD-10-CM

## 2018-11-20 DIAGNOSIS — C3410 Malignant neoplasm of upper lobe, unspecified bronchus or lung: Secondary | ICD-10-CM

## 2018-11-20 DIAGNOSIS — I10 Essential (primary) hypertension: Secondary | ICD-10-CM

## 2018-11-20 DIAGNOSIS — E1142 Type 2 diabetes mellitus with diabetic polyneuropathy: Secondary | ICD-10-CM | POA: Diagnosis not present

## 2018-11-20 DIAGNOSIS — E785 Hyperlipidemia, unspecified: Secondary | ICD-10-CM

## 2018-11-20 LAB — COMPREHENSIVE METABOLIC PANEL
ALT: 7 U/L (ref 0–53)
AST: 10 U/L (ref 0–37)
Albumin: 3.7 g/dL (ref 3.5–5.2)
Alkaline Phosphatase: 67 U/L (ref 39–117)
BUN: 16 mg/dL (ref 6–23)
CO2: 33 mEq/L — ABNORMAL HIGH (ref 19–32)
Calcium: 9.7 mg/dL (ref 8.4–10.5)
Chloride: 102 mEq/L (ref 96–112)
Creatinine, Ser: 0.77 mg/dL (ref 0.40–1.50)
GFR: 97.86 mL/min (ref >60.00)
Glucose, Bld: 79 mg/dL (ref 70–99)
Potassium: 4.2 mEq/L (ref 3.5–5.1)
Sodium: 140 mEq/L (ref 135–145)
Total Bilirubin: 0.4 mg/dL (ref 0.2–1.2)
Total Protein: 6.1 g/dL (ref 6.0–8.3)

## 2018-11-20 LAB — LIPID PANEL
Cholesterol: 161 mg/dL (ref 0–200)
HDL: 52.5 mg/dL (ref >39.00)
LDL Cholesterol: 74 mg/dL (ref 0–99)
NonHDL: 108.85
Total CHOL/HDL Ratio: 3
Triglycerides: 175 mg/dL — ABNORMAL HIGH (ref 0.0–149.0)
VLDL: 35 mg/dL (ref 0.0–40.0)

## 2018-11-20 LAB — HEMOGLOBIN A1C: Hgb A1c MFr Bld: 6.2 % (ref 4.6–6.5)

## 2018-11-20 MED ORDER — FLUTICASONE-SALMETEROL 250-50 MCG/DOSE IN AEPB: 1.0000 | INHALATION_SPRAY | Freq: Two times a day (BID) | RESPIRATORY_TRACT | 3 refills | Status: DC

## 2018-11-20 MED ORDER — ALBUTEROL SULFATE HFA 108 (90 BASE) MCG/ACT IN AERS: INHALATION_SPRAY | RESPIRATORY_TRACT | 5 refills | Status: DC

## 2018-11-20 MED FILL — PRAVASTATIN SODIUM 80 MG TA: 80 | 90 days supply | Qty: 90 | Fill #0

## 2018-11-20 NOTE — Progress Notes (Signed)
   Subjective:    Patient ID: Robert Bates, male    DOB: 19-Jul-1941, 78 y.o.   MRN: [id number]  HPI  Patietn is a 78 yr old male who presents today for follow up.  HTN- maintained on cartia xt. He denies LE edema. BP Readings from Last 3 Encounters:  11/20/18 126/65  07/13/18 102/84  07/08/18 114/72   DM2- diet controlled.  Lab Results  Component Value Date   HGBA1C 6.1 12/25/2017   HGBA1C 5.9 (H) 09/30/2017   HGBA1C 6.5 07/30/2017   Lab Results  Component Value Date   LDLCALC 46 12/25/2017   CREATININE 0.82 04/27/2018   Hyperlipidemia- maintained on pravastatin.  Lab Results  Component Value Date   CHOL 119 12/25/2017   HDL 52.80 12/25/2017   LDLCALC 46 12/25/2017   LDLDIRECT 142.2 12/26/2008   TRIG 100.0 12/25/2017   CHOLHDL 2 12/25/2017   Notes that he has had recent wheezing following a recent cold. Denies fever.    Review of Systems     Objective:   Physical Exam Constitutional:      General: He is not in acute distress.    Appearance: He is well-developed.  HENT:     Head: Normocephalic and atraumatic.  Cardiovascular:     Rate and Rhythm: Normal rate and regular rhythm.     Heart sounds: No murmur.  Pulmonary:     Effort: Pulmonary effort is normal. No respiratory distress.     Breath sounds: Normal breath sounds. No wheezing or rales.  Skin:    General: Skin is warm and dry.  Neurological:     Mental Status: He is alert and oriented to person, place, and time.  Psychiatric:        Behavior: Behavior normal.        Thought Content: Thought content normal.           Assessment & Plan:  HTN-  BP stable continue diltiazem  DM2- clinically stable. Obtain follow up a1c.  COPD- not using wixella. Reports some recent wheezing. Still smoking occasionally. Advised pt to d/c smoking, restart wixela, albuterol PRN.  Hyperlipidemia- tolerating statin, obtain follow up lipid panel.    Hx of stage IIB non-small cell lung cancer, squamous cell  carcinoma -s/p curative radiotherapy- following with Dr. Mckinley Jewel for surveillance.

## 2018-11-20 NOTE — Patient Instructions (Signed)
Please complete lab work prior to leaving.   

## 2018-12-09 ENCOUNTER — Other Ambulatory Visit: Payer: Self-pay | Admitting: Family

## 2018-12-09 MED FILL — ELIQUIS 5 MG TABLET: 5 | 30 days supply | Qty: 60 | Fill #3

## 2019-01-11 MED FILL — OMEPRAZOLE 40 MG CPDR: 40 | 30 days supply | Qty: 30 | Fill #0

## 2019-01-11 MED FILL — ELIQUIS 5 MG TABLET: 5 | 30 days supply | Qty: 60 | Fill #4

## 2019-01-18 MED FILL — ELIQUIS 5 MG TABLET: 5 | 30 days supply | Qty: 60 | Fill #5

## 2019-02-17 ENCOUNTER — Other Ambulatory Visit: Payer: Self-pay | Admitting: Physician Assistant

## 2019-02-17 ENCOUNTER — Telehealth: Payer: Self-pay | Admitting: *Deleted

## 2019-02-17 MED FILL — PRAVASTATIN SODIUM 80 MG TA: 80 | 90 days supply | Qty: 90 | Fill #1

## 2019-02-17 MED FILL — CARTIA XT 120 MG CP24: 120 | 90 days supply | Qty: 90 | Fill #1

## 2019-02-17 MED FILL — OMEPRAZOLE 40 MG CPDR: 40 | 30 days supply | Qty: 30 | Fill #1

## 2019-02-17 NOTE — Telephone Encounter (Signed)
Eliquis 5mg  refill received; Pt is 78 yrs old, wt-77.1kg, Crea-0.77 on 11/20/2018, last seen by Vin 07/08/2018; will send in a refill.

## 2019-02-17 NOTE — Telephone Encounter (Signed)
Error

## 2019-02-17 NOTE — Telephone Encounter (Signed)
Request for Eliquis received; no detailed info for the msg such as where to send. Will send back to sender for detailed info.

## 2019-02-17 NOTE — Telephone Encounter (Signed)
I apologize [name], it is Patent attorney, [place]  Thank you

## 2019-02-18 NOTE — Telephone Encounter (Signed)
Thank you. It's taken care of & Thanks.

## 2019-03-11 MED FILL — ELIQUIS 5 MG TABLET: 5 | 30 days supply | Qty: 60 | Fill #0

## 2019-03-11 MED FILL — OMEPRAZOLE 40 MG CPDR: 40 | 30 days supply | Qty: 30 | Fill #2

## 2019-03-26 ENCOUNTER — Other Ambulatory Visit: Payer: Self-pay

## 2019-03-26 ENCOUNTER — Ambulatory Visit: Payer: Medicare Other | Admitting: Family

## 2019-03-26 DIAGNOSIS — Z5329 Procedure and treatment not carried out because of patient's decision for other reasons: Secondary | ICD-10-CM

## 2019-03-26 DIAGNOSIS — Z91199 Patient's noncompliance with other medical treatment and regimen due to unspecified reason: Secondary | ICD-10-CM

## 2019-03-26 NOTE — Progress Notes (Signed)
Patient was unavailable at time of scheduled visit. No show.

## 2019-03-30 ENCOUNTER — Telehealth: Payer: Self-pay | Admitting: Family

## 2019-03-30 ENCOUNTER — Other Ambulatory Visit: Payer: Self-pay

## 2019-03-30 ENCOUNTER — Ambulatory Visit (INDEPENDENT_AMBULATORY_CARE_PROVIDER_SITE_OTHER): Payer: Medicare Other | Admitting: Family

## 2019-03-30 DIAGNOSIS — I480 Paroxysmal atrial fibrillation: Secondary | ICD-10-CM

## 2019-03-30 DIAGNOSIS — I714 Abdominal aortic aneurysm, without rupture, unspecified: Secondary | ICD-10-CM

## 2019-03-30 DIAGNOSIS — E119 Type 2 diabetes mellitus without complications: Secondary | ICD-10-CM

## 2019-03-30 DIAGNOSIS — C3410 Malignant neoplasm of upper lobe, unspecified bronchus or lung: Secondary | ICD-10-CM

## 2019-03-30 DIAGNOSIS — J449 Chronic obstructive pulmonary disease, unspecified: Secondary | ICD-10-CM

## 2019-03-30 DIAGNOSIS — D329 Benign neoplasm of meninges, unspecified: Secondary | ICD-10-CM

## 2019-03-30 DIAGNOSIS — I10 Essential (primary) hypertension: Secondary | ICD-10-CM

## 2019-03-30 MED ORDER — FLUTICASONE-SALMETEROL 250-50 MCG/DOSE IN AEPB: 1.0000 | INHALATION_SPRAY | Freq: Two times a day (BID) | RESPIRATORY_TRACT | 5 refills | Status: DC

## 2019-03-30 NOTE — Telephone Encounter (Signed)
Can you please call Dr. Lacy Duverney office and request most recent office note and imaging results?

## 2019-03-30 NOTE — Progress Notes (Signed)
Virtual Visit via Telephone Note  I connected with Robert Bates on 03/30/19 at  3:40 PM EDT by telephone and verified that I am speaking with the correct person using two identifiers.  Location: Patient: home Provider: office   I discussed the limitations, risks, security and privacy concerns of performing an evaluation and management service by telephone and the availability of in person appointments. I also discussed with the patient that there may be a patient responsible charge related to this service. The patient expressed understanding and agreed to proceed.   History of Present Illness:   DM2- reports that he is not really watching his diet.  Lab Results  Component Value Date   HGBA1C 6.2 11/20/2018   HGBA1C 6.1 12/25/2017   HGBA1C 5.9 (H) 09/30/2017   Lab Results  Component Value Date   LDLCALC 74 11/20/2018   CREATININE 0.77 11/20/2018   AAA- had follow up US 3/19 with recommendation for repeat 01/16/21  Meningioma- followed by Dr Christella Noa- reports that he saw Dr. Christella Noa last year.   HTN- not checking blood pressure at home.   BP Readings from Last 3 Encounters:  11/20/18 126/65  07/13/18 102/84  07/08/18 114/72   Reports mood is good.    Notes increased wheezing recently. Not taking wixela. Does continue to use prn albuterol.   Lung cancer- he continues to follow with Dr. Aundra Dubin for annual visits. Last visit was 9/19 and at that time CT chest was stable.   PAF- on eliquis and Cartia- managed by cardiology.    Observations/Objective:   Gen: Awake, alert, no acute distress Resp: Breathing sounds even and non-labored Psych: calm/pleasant demeanor Neuro: Alert and Oriented x 3, speech sounds clear.  Assessment and Plan:  Lung Cancer- advised pt to arrange a follow up appointment with oncology in September.   PAF- clinically stable- advised pt to schedule follow up with with cardiology for September.  AAA- was stable on [place] 3/19. Will need follow up  US 2022.   Meningioma- followed by neurosurgery- will request most recent imaging and visit notes.    COPD- increased wheezing recently. Advised pt to restart wixela.  HTN- clinically stable. Continue current meds (lasix and cartia) Plan to recheck bp in the office for a face to face visit with labs in 3 months.   DM2- clinically stable. Reinforced DM diet.     Follow Up Instructions:    I discussed the assessment and treatment plan with the patient. The patient was provided an opportunity to ask questions and all were answered. The patient agreed with the plan and demonstrated an understanding of the instructions.   The patient was advised to call back or seek an in-person evaluation if the symptoms worsen or if the condition fails to improve as anticipated.  I provided 15  minutes of non-face-to-face time during this encounter.   Nance Pear, NP

## 2019-04-05 NOTE — Telephone Encounter (Signed)
Per Dr. Pete Pelt office patient has not been seen since 2014. I tried to contact patient for more information but he does not answer the telephone.

## 2019-04-05 NOTE — Telephone Encounter (Signed)
Noted. I will arrange follow up consult with Dr. Christella Noa.

## 2019-04-07 NOTE — Telephone Encounter (Signed)
Patient called back advised about reason for MRI, he understands and is already scheduled to have this done.

## 2019-04-07 NOTE — Telephone Encounter (Signed)
Dr. Lacy Duverney office called and said that pt needs updated imaging before they will schedule him. Will arrange.  Please let pt know that I am ordering an MRI to to follow up on his brain meningioma.

## 2019-04-13 MED FILL — OMEPRAZOLE 40 MG CPDR: 40 | 30 days supply | Qty: 30 | Fill #3

## 2019-04-13 MED FILL — ELIQUIS 5 MG TABLET: 5 | 30 days supply | Qty: 60 | Fill #1

## 2019-04-14 ENCOUNTER — Ambulatory Visit (HOSPITAL_COMMUNITY): Payer: Medicare Other

## 2019-04-28 ENCOUNTER — Other Ambulatory Visit: Payer: Self-pay

## 2019-04-28 ENCOUNTER — Ambulatory Visit (INDEPENDENT_AMBULATORY_CARE_PROVIDER_SITE_OTHER): Payer: Medicare Other | Admitting: Family

## 2019-04-28 DIAGNOSIS — D329 Benign neoplasm of meninges, unspecified: Secondary | ICD-10-CM | POA: Diagnosis not present

## 2019-04-28 DIAGNOSIS — M2560 Stiffness of unspecified joint, not elsewhere classified: Secondary | ICD-10-CM

## 2019-04-28 DIAGNOSIS — R5383 Other fatigue: Secondary | ICD-10-CM

## 2019-04-28 NOTE — Progress Notes (Signed)
Virtual Visit via Telephone Note  I connected with Robert Bates on 04/28/19 at 11:40 AM EDT by telephone and verified that I am speaking with the correct person using two identifiers.  Location: Patient: home Provider: home   I discussed the limitations, risks, security and privacy concerns of performing an evaluation and management service by telephone and the availability of in person appointments. I also discussed with the patient that there may be a patient responsible charge related to this service. The patient expressed understanding and agreed to proceed.   History of Present Illness:  Patient report that he was out of work from 1/10 until 2 weeks ago. Reports that he has had increased body stiffness. Reports low energy. Reports that he bought some OTC tylenol. Took his first dose last night along with a multivitamins.  He does note some joint pain.He denies fever. He has a chronic cough which is unchanged. Reports some mild HA's.  He has an upcoming MRI to follow up on his meningioma.    Observations/Objective:   Gen: Awake, alert, no acute distress Resp: Breathing is even and non-labored Psych: calm/pleasant demeanor Neuro: Alert and Oriented x 3,  speech is clear.    Assessment and Plan:  Joint stiffness- need to rule out autoimmune etiology such as PMR/RA.  Check ESR, ANA, Rheumatoid factor.  Advised pt to continue prn Tylenol  Fatigue- obtain cmet, cbc, tsh.  Meningioma- pt advised to keep his upcoming appointment for MRI.       Follow Up Instructions:    I discussed the assessment and treatment plan with the patient. The patient was provided an opportunity to ask questions and all were answered. The patient agreed with the plan and demonstrated an understanding of the instructions.   The patient was advised to call back or seek an in-person evaluation if the symptoms worsen or if the condition fails to improve as anticipated.  I provided 11 minutes of  non-face-to-face time during this encounter.   Nance Pear, NP

## 2019-05-04 ENCOUNTER — Telehealth: Payer: Self-pay | Admitting: Family

## 2019-05-04 NOTE — Telephone Encounter (Signed)
Called pt to make lab appt no answer on cell tried twice left msg. Also tired home number wife directed me back to cell phone

## 2019-05-05 MED FILL — ALBUTEROL SULFATE HFA 108 (: 108 (90 BAS | 25 days supply | Qty: 9 | Fill #0

## 2019-05-06 ENCOUNTER — Other Ambulatory Visit: Payer: Self-pay | Admitting: Family

## 2019-05-07 MED FILL — SYMBICORT 160-4.5 MCG INH: 160-4.5 | 30 days supply | Qty: 10 | Fill #0

## 2019-05-15 ENCOUNTER — Ambulatory Visit (HOSPITAL_COMMUNITY)
Admission: RE | Admit: 2019-05-15 | Discharge: 2019-05-15 | Disposition: A | Discharge status: Home / Self Care | Payer: Medicare Other | Source: Ambulatory Visit | Attending: Family | Admitting: Family

## 2019-05-15 DIAGNOSIS — D329 Benign neoplasm of meninges, unspecified: Secondary | ICD-10-CM | POA: Diagnosis not present

## 2019-05-16 ENCOUNTER — Telehealth: Payer: Self-pay | Admitting: Family

## 2019-05-16 DIAGNOSIS — D329 Benign neoplasm of meninges, unspecified: Secondary | ICD-10-CM

## 2019-05-16 NOTE — Telephone Encounter (Signed)
Please call pt and let him know that MRI shows stable brain tumors. I am going to arrange follow up with his neurosurgeon for surveillance.

## 2019-05-17 NOTE — Telephone Encounter (Signed)
Patient advised of results and referral ?

## 2019-05-19 ENCOUNTER — Other Ambulatory Visit: Payer: Self-pay | Admitting: Family

## 2019-05-19 MED FILL — ELIQUIS 5 MG TABLET: 5 | 30 days supply | Qty: 60 | Fill #2

## 2019-05-19 MED FILL — CARTIA XT 120 MG CP24: 120 | 90 days supply | Qty: 90 | Fill #2

## 2019-05-19 MED FILL — PRAVASTATIN SODIUM 80 MG TA: 80 | 90 days supply | Qty: 90 | Fill #0

## 2019-05-19 MED FILL — OMEPRAZOLE 40 MG CPDR: 40 | 30 days supply | Qty: 30 | Fill #4

## 2019-05-28 IMAGING — CT CT CHEST W/ CM
2 of 3 series · 15 of 36 positions shown, 18 images · IV contrast (iopamidol)
Comparison: 03/25/2017 chest CT.

CLINICAL DATA: Stage IIB squamous cell left lower lobe lung
carcinoma and lung adenocarcinoma of the left upper lobe diagnosed
restaging on observation.

EXAM:
CT CHEST WITH CONTRAST
TECHNIQUE: Multidetector CT imaging of the chest was performed during
intravenous contrast administration.
CONTRAST:  80mL [id number] IOPAMIDOL (EI1DUX-RQQ) INJECTION 61%

[Series 2: axial st · axial · 0.83mm/px · z∈[-359,-61]mm · 12 of 177 slices shown, 15 images]
[im 14/177  mediastinal]
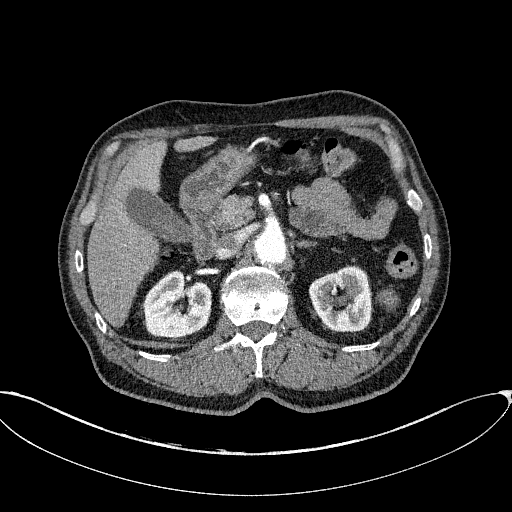
[im 14/177  lung]
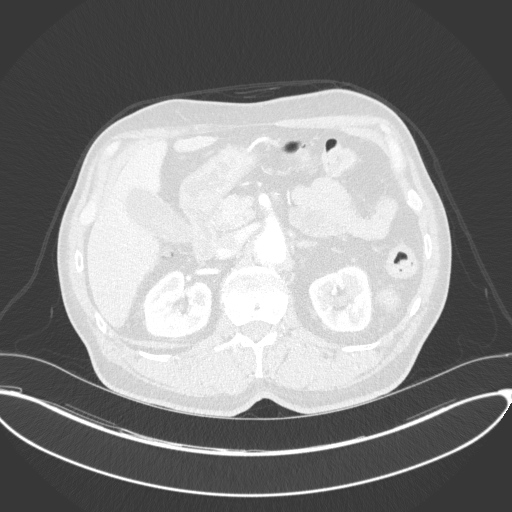
[im 27/177  lung]
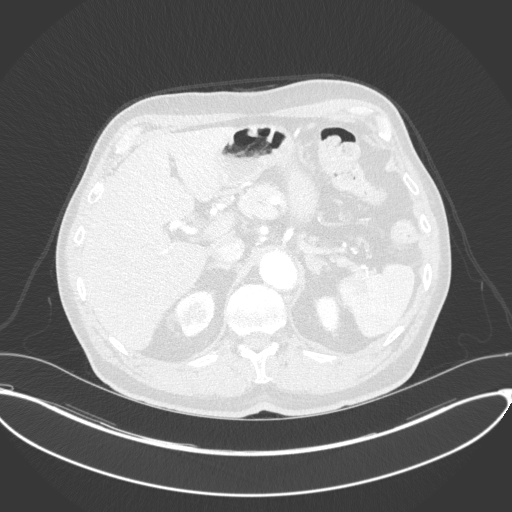
[im 40/177  lung]
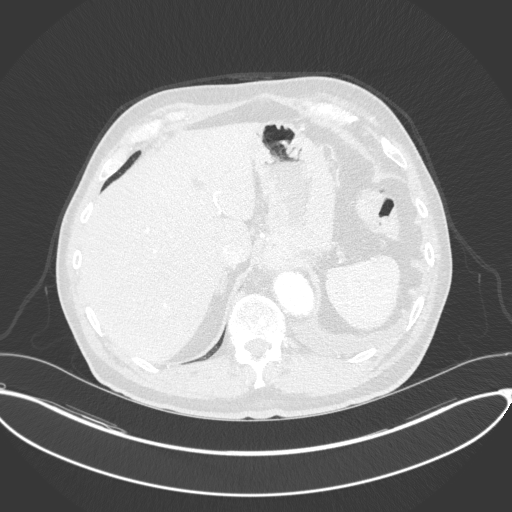
[im 53/177  lung]
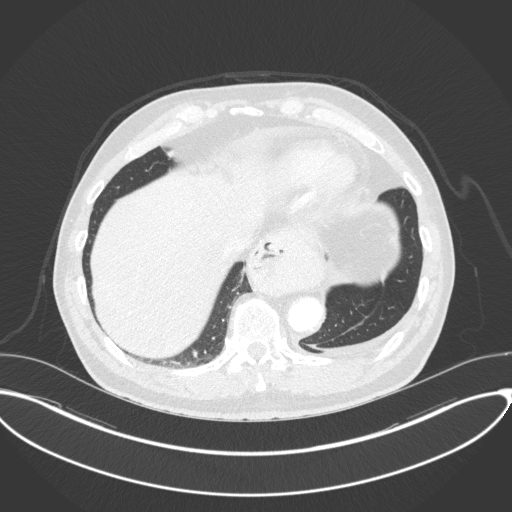
[im 66/177  mediastinal]
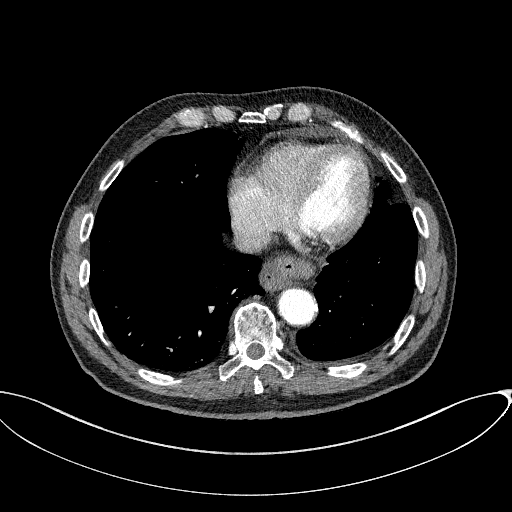
[im 66/177  lung]
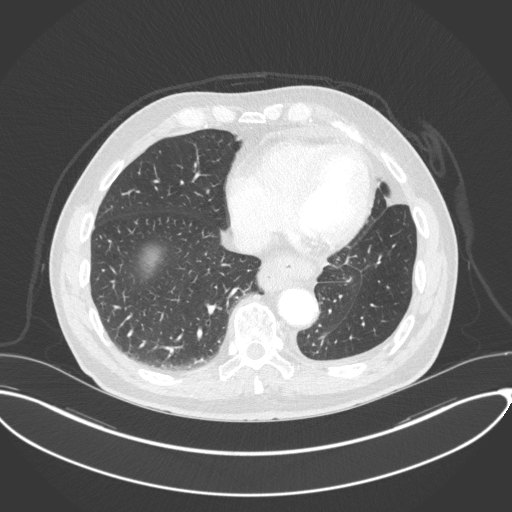
[im 79/177  lung]
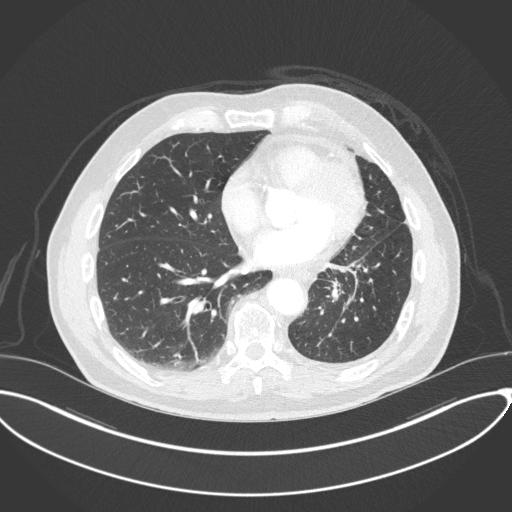
[im 98/177  lung]
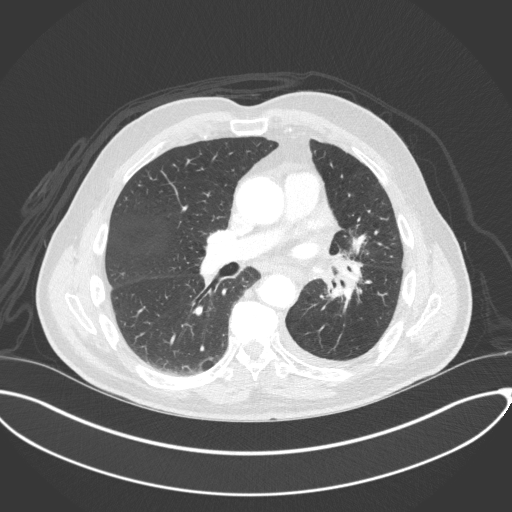
[im 111/177  lung]
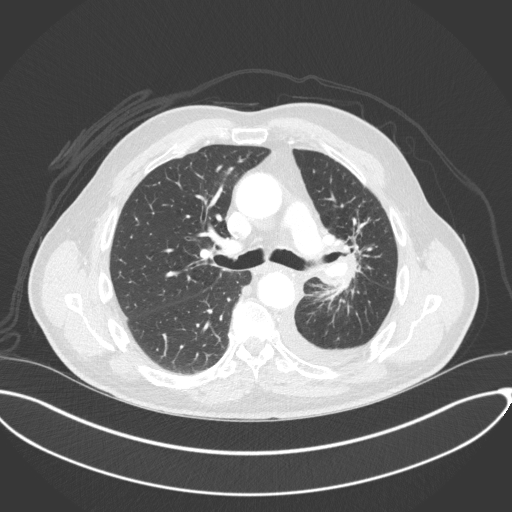
[im 124/177  mediastinal]
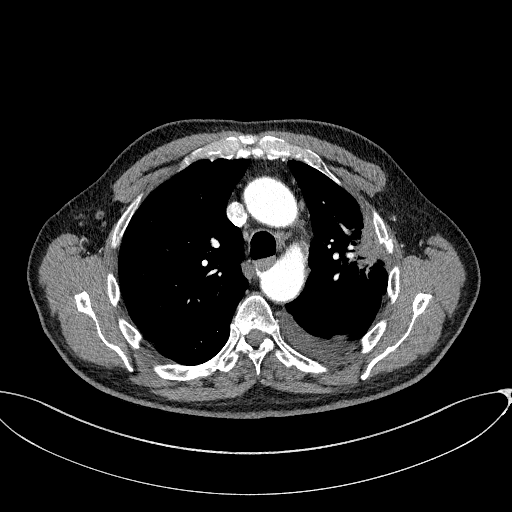
[im 124/177  lung]
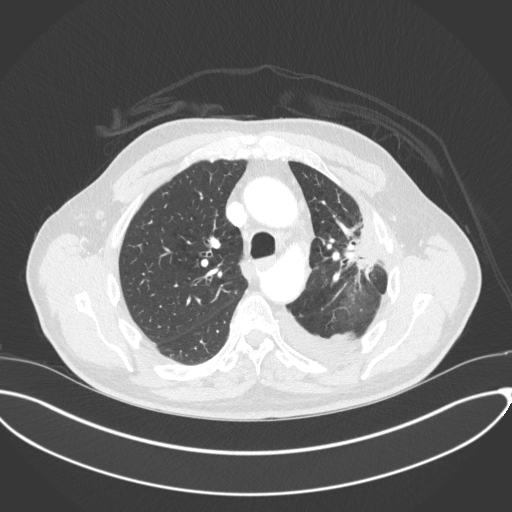
[im 137/177  lung]
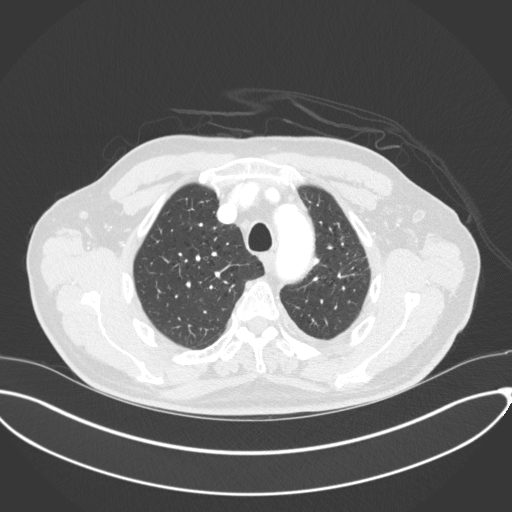
[im 150/177  lung]
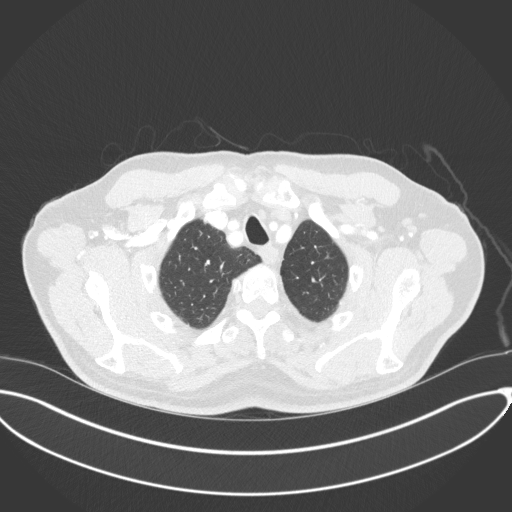
[im 163/177  lung]
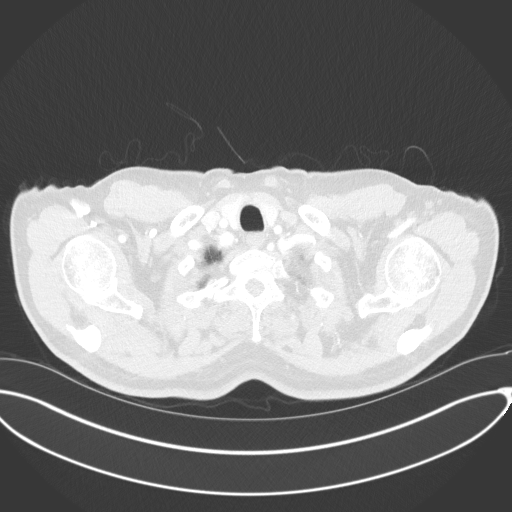

[Series 5: coronal · coronal · 0.69mm/px · 3 of 131 slices shown]
[im 27/131  lung]
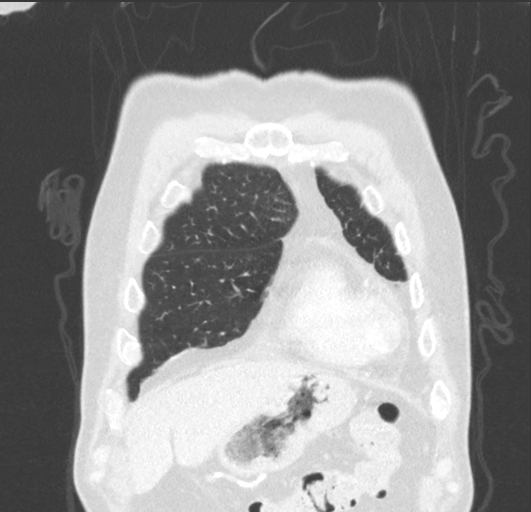
[im 53/131  lung]
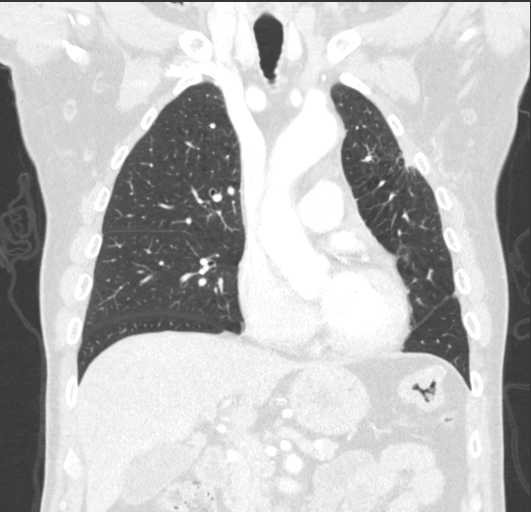
[im 79/131  lung]
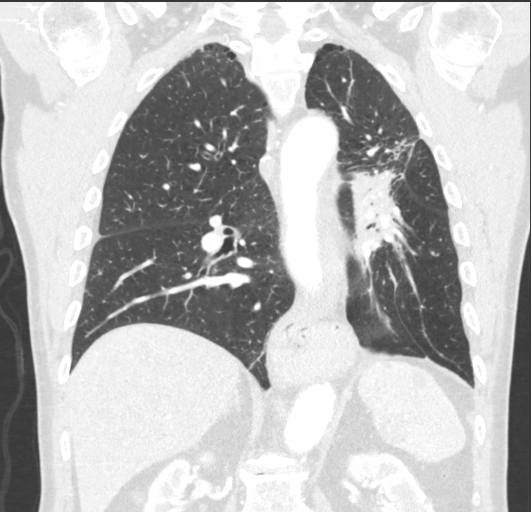

[15 of 36 positions shown; findings below may reference images not displayed]

FINDINGS: Cardiovascular: Normal heart size. Stable small pericardial
effusion/ thickening anteriorly. Left main and left anterior
descending coronary atherosclerosis. Atherosclerotic nonaneurysmal
thoracic aorta. Normal caliber pulmonary arteries . No central
pulmonary emboli.

Mediastinum/Nodes: Stable subcentimeter hypodense right thyroid lobe
nodule. Unremarkable esophagus. No pathologically enlarged axillary,
mediastinal or hilar lymph nodes.

Lungs/Pleura: No pneumothorax. No right pleural effusion. Trace
dependent left pleural effusion, stable. Mild centrilobular and
paraseptal emphysema. There is continued evolution of sharply
marginated parahilar consolidation in the left upper and lower lobes
with associated mildly progressive significant volume loss and
distortion and mild bronchiectasis, compatible with evolving
radiation fibrosis. Peripheral right upper lobe 2 mm solid pulmonary
nodule (series 3/ image 39) is stable back to at least 07/18/2015
and considered benign. No acute consolidative airspace disease, lung
masses or new significant pulmonary nodules.

Upper abdomen: Small scattered liver cysts, largest 1.3 cm. Stable
1.3 cm left adrenal adenoma back to 07/18/2015. Stable scarring in
the lateral upper right kidney.

Musculoskeletal: No aggressive appearing focal osseous lesions. Mild
thoracic spondylosis.
IMPRESSION: 1. Evolving radiation fibrosis in the parahilar left lung, with no
findings suspicious for local tumor recurrence.
2. No evidence of metastatic disease in the chest .
3. Stable trace dependent left pleural effusion.
4. Left main and 1 vessel coronary atherosclerosis.
5. Stable left adrenal adenoma.

Aortic Atherosclerosis (QZDK6-UL3.3) and Emphysema (QZDK6-J1Y.3).

## 2019-06-22 MED FILL — OMEPRAZOLE 40 MG CPDR: 40 | 30 days supply | Qty: 30 | Fill #5

## 2019-06-22 MED FILL — ELIQUIS 5 MG TABLET: 5 | 30 days supply | Qty: 60 | Fill #3

## 2019-06-22 MED FILL — ALBUTEROL SULFATE HFA 108 (: 108 (90 BAS | 25 days supply | Qty: 9 | Fill #1

## 2019-07-06 ENCOUNTER — Telehealth: Payer: Self-pay | Admitting: Medical Oncology

## 2019-07-06 ENCOUNTER — Inpatient Hospital Stay: Payer: Medicare Other

## 2019-07-06 ENCOUNTER — Ambulatory Visit (HOSPITAL_COMMUNITY): Admission: RE | Admit: 2019-07-06 | Payer: Medicare Other | Source: Ambulatory Visit

## 2019-07-06 ENCOUNTER — Other Ambulatory Visit: Payer: Self-pay

## 2019-07-06 NOTE — Telephone Encounter (Signed)
CT rescheduled

## 2019-07-09 ENCOUNTER — Telehealth: Payer: Self-pay | Admitting: Internal Medicine

## 2019-07-09 NOTE — Telephone Encounter (Signed)
R/s appt per 9/1 sch message - unable to reach pt - left message with appt date and time

## 2019-07-13 ENCOUNTER — Ambulatory Visit: Payer: Medicare Other | Admitting: Internal Medicine

## 2019-07-15 ENCOUNTER — Other Ambulatory Visit: Payer: Self-pay

## 2019-07-16 ENCOUNTER — Ambulatory Visit (INDEPENDENT_AMBULATORY_CARE_PROVIDER_SITE_OTHER): Payer: Medicare Other | Admitting: Family

## 2019-07-16 VITALS — BP 124/80 | HR 64 | Temp 97.6°F | Resp 20 | Ht 68.0 in | Wt 170.0 lb

## 2019-07-16 DIAGNOSIS — J441 Chronic obstructive pulmonary disease with (acute) exacerbation: Secondary | ICD-10-CM | POA: Diagnosis not present

## 2019-07-16 DIAGNOSIS — L0291 Cutaneous abscess, unspecified: Secondary | ICD-10-CM | POA: Diagnosis not present

## 2019-07-16 MED ORDER — CEPHALEXIN 500 MG PO CAPS: 500.0000 mg | ORAL_CAPSULE | Freq: Three times a day (TID) | ORAL | 0 refills | Status: DC

## 2019-07-16 MED ORDER — PREDNISONE 10 MG PO TABS: ORAL_TABLET | ORAL | 0 refills | Status: DC

## 2019-07-16 MED FILL — CEPHALEXIN 500 MG CAPSULE: 500 | 7 days supply | Qty: 21 | Fill #0

## 2019-07-16 MED FILL — predniSONE 10 MG TABS: 10 | 8 days supply | Qty: 20 | Fill #0

## 2019-07-16 NOTE — Patient Instructions (Signed)
Please begin prednisone taper for your breathing. Begin keflex for left arm (antibiotic). Go to the ER if you develop increased redness/swelling or if you develop bleeding that does not stop from the left forearm.

## 2019-07-16 NOTE — Progress Notes (Signed)
Subjective:    Patient ID: Robert Bates, male    DOB: 1941-03-12, 78 y.o.   MRN: [id number]  HPI  Patient is a 78 yr old male who presents today with chief complaint of arm injury. Reports that he hit his arm on the door yesterday and developed bruising/bleeding. He is maintained on Eliquis.   COPD- pt reports that he has had increased wheezing lately and has also had to use his inhaler more frequently.  Review of Systems    see HPI  Past Medical History:  Diagnosis Date  . Alcoholism ([place])    past problem per pt but admits to still drinking - unclear how much  . Anxiety   . Arthritis   . Cancer ([place]) 2016   lung- 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  [name] COPD (chronic obstructive pulmonary disease) ([place])   . Diabetes type 2, controlled ([place]) 07/31/2017  . Dysrhythmia    a fib  . GERD (gastroesophageal reflux disease)   . Hematuria    refuses work up or referral - understands risks of morbidity / mortality - 11/2008, 12/2008  . History of hiatal hernia   . History of kidney stones   . Hyperlipemia   . Meningioma ([place]) 10/25/2013   Follows with Dr. [name].   . Peripheral vascular disease ([place])    Abdominal Aortic Aneursym  . Pneumonia    as a child  . Radiation 09/18/15-10/25/15   left lower lobe 70.2 Gy  . Tobacco abuse      Social History   Socioeconomic History  . Marital status: Married    Spouse name: Not on file  . Number of children: 2  . Years of education: Not on file  . Highest education level: Not on file  Occupational History  . Occupation: Retired    Fish farm manager: [employer]    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[place]  . Financial resource strain: Not on file  . Food insecurity    Worry: Not on file    Inability: Not on file  . Transportation needs    Medical: Not on file    Non-medical: Not on file  Tobacco Use  . Smoking status: Former Smoker   Packs/day: 1.00    Years: 57.00    Pack years: 57.00    Quit date: 08/08/2015    Years since quitting: 3.9  . Smokeless tobacco: Former Systems developer    Types: Chew    Quit date: 11/04/1958  . Tobacco comment: occasional use sneaks around  Substance and Sexual Activity  . Alcohol use: Yes    Alcohol/week: 0.0 standard drinks    Frequency: Never    Comment: Pt reports rare use hx of denies 10-01-17  . Drug use: No  . Sexual activity: Yes  Lifestyle  . Physical activity    Days per week: Not on file    Minutes per session: Not on file  . Stress: Not on file  Relationships  . Social Herbalist on phone: Not on file    Gets together: Not on file    Attends religious service: Not on file    Active member of club or organization: Not on file    Attends meetings of clubs or organizations: Not on file    Relationship status: Not on file  . Intimate partner violence    Fear of current or ex partner:  Not on file    Emotionally abused: Not on file    Physically abused: Not on file    Forced sexual activity: Not on file  Other Topics Concern  . Not on file  Social History Narrative  . Not on file    Past Surgical History:  Procedure Laterality Date  . CHOLECYSTECTOMY N/A 07/23/2017   Procedure: LAPAROSCOPIC CHOLECYSTECTOMY;  Surgeon: Kinsinger, Arta Bruce, MD;  Location: [facility];  Service: General;  Laterality: N/A;  . COLONOSCOPY    . EYE SURGERY Bilateral    Cataracts removed w/ lens implant  . HERNIA REPAIR     Left 36 years ago . Right inguinal hernia repair 10-01-17 Dr. Kieth Brightly  . INGUINAL HERNIA REPAIR Right 10/01/2017   Procedure: RIGHT INGUINAL HERNIA REPAIR WITH MESH;  Surgeon: Kinsinger, Arta Bruce, MD;  Location: [facility];  Service: General;  Laterality: Right;  TAP BLOCK  . INSERTION OF MESH Right 10/01/2017   Procedure: INSERTION OF MESH;  Surgeon: Kinsinger, Arta Bruce, MD;  Location: [facility];  Service: General;  Laterality: Right;  . TONSILLECTOMY    .  TONSILLECTOMY    . VIDEO BRONCHOSCOPY Bilateral 07/26/2015   Procedure: VIDEO BRONCHOSCOPY WITH FLUORO;  Surgeon: [name], MD;  Location: [facility];  Service: Cardiopulmonary;  Laterality: Bilateral;  . VIDEO BRONCHOSCOPY WITH ENDOBRONCHIAL NAVIGATION N/A 08/23/2015   Procedure: VIDEO BRONCHOSCOPY WITH ENDOBRONCHIAL NAVIGATION;  Surgeon: Grace Isaac, MD;  Location: [facility];  Service: Thoracic;  Laterality: N/A;  . VIDEO BRONCHOSCOPY WITH ENDOBRONCHIAL ULTRASOUND N/A 08/23/2015   Procedure: VIDEO BRONCHOSCOPY WITH ENDOBRONCHIAL ULTRASOUND;  Surgeon: Grace Isaac, MD;  Location: [facility];  Service: Thoracic;  Laterality: N/A;    Family History  Problem Relation Age of Onset  . Leukemia Father   . Emphysema Father   . Learning disabilities Son   . Leukemia Other   . Stroke Other     Allergies  Allergen Reactions  . Iodine Other (See Comments)    neck swells    Current Outpatient Medications on File Prior to Visit  Medication Sig Dispense Refill  . albuterol (PROAIR HFA) 108 (90 Base) MCG/ACT inhaler INHALE 2 PUFFS BY MOUTH INTO THE LUNGS EVERY 6 (SIX) HOURS AS NEEDED FOR WHEEZING OR SHORTNESS OF BREATH. 8.5 g 5  . CARTIA XT 120 MG 24 hr capsule TAKE 1 CAPSULE (120 MG TOTAL) BY MOUTH DAILY. 90 capsule 2  . ELIQUIS 5 MG TABS tablet TAKE 1 TABLET (5 MG TOTAL) BY MOUTH 2 (TWO) TIMES DAILY. 180 tablet 1  . Fluticasone-Salmeterol (WIXELA INHUB) 250-50 MCG/DOSE AEPB Inhale 1 puff into the lungs 2 (two) times daily. 60 each 5  . omeprazole (PRILOSEC) 40 MG capsule TAKE 1 CAPSULE BY MOUTH DAILY. PATIENT NEEDS OFFICE VISIT FOR FURTHER REFILLS 30 capsule 5  . pravastatin (PRAVACHOL) 80 MG tablet TAKE 1 TABLET (80 MG TOTAL) BY MOUTH AT BEDTIME. 90 tablet 1  . SYMBICORT 160-4.5 MCG/ACT inhaler INHALE 2 PUFFS INTO THE LUNGS 2 (TWO) TIMES DAILY. 10.2 g 5  . furosemide (LASIX) 20 MG tablet Take 1 tablet (20 mg total) by mouth daily as needed for edema. 30 tablet 1   No current  facility-administered medications on file prior to visit.     BP 124/80 (BP Location: Right Arm, Patient Position: Sitting, Cuff Size: Normal)   Pulse 64   Temp 97.6 F (36.4 C) (Temporal)   Resp 20   Ht 5\' 8"  (1.727 m)   Wt 170 lb (77.1  kg)   SpO2 97%   BMI 25.85 kg/m    Objective:   Physical Exam Constitutional:      General: He is not in acute distress.    Appearance: He is well-developed.  HENT:     Head: Normocephalic and atraumatic.  Cardiovascular:     Rate and Rhythm: Normal rate and regular rhythm.     Heart sounds: No murmur.  Pulmonary:     Effort: Pulmonary effort is normal. No respiratory distress.     Breath sounds: Examination of the left-lower field reveals rhonchi. Rhonchi present. No wheezing or rales.  Skin:    General: Skin is warm and dry.     Comments: Fluctuant lesion on left forearm, non-tender  Neurological:     Mental Status: He is alert and oriented to person, place, and time.  Psychiatric:        Behavior: Behavior normal.        Thought Content: Thought content normal.            Assessment & Plan:  Abscess- Procedure including risks/benefits explained to patient.  Questions were answered. After informed consent was obtained and a time out completed, site was cleansed with  alcohol. 1% Lidocaine with epinephrine was infiltrated into the lesion and then a small superficial incision was made in the center.  Thick purulence was expressed.   Hemostasis was obtained.  Pt tolerated procedure well.   Pt instructed to contact us if he develops increased redness, drainage or swelling at the site. Will rx with keflex.  COPD exacerbation- mild. Will rx with prednisone taper. Continue Wixela and prn albuterol.

## 2019-07-19 ENCOUNTER — Other Ambulatory Visit: Payer: Self-pay

## 2019-07-19 ENCOUNTER — Other Ambulatory Visit: Payer: Self-pay | Admitting: Internal Medicine

## 2019-07-19 ENCOUNTER — Ambulatory Visit (HOSPITAL_COMMUNITY)
Admission: RE | Admit: 2019-07-19 | Discharge: 2019-07-19 | Disposition: A | Discharge status: Home / Self Care | Payer: Medicare Other | Source: Ambulatory Visit | Attending: Internal Medicine | Admitting: Internal Medicine

## 2019-07-19 ENCOUNTER — Inpatient Hospital Stay: Payer: Medicare Other | Attending: Internal Medicine

## 2019-07-19 DIAGNOSIS — Z85118 Personal history of other malignant neoplasm of bronchus and lung: Secondary | ICD-10-CM | POA: Insufficient documentation

## 2019-07-19 DIAGNOSIS — C3490 Malignant neoplasm of unspecified part of unspecified bronchus or lung: Secondary | ICD-10-CM

## 2019-07-19 LAB — CBC WITH DIFFERENTIAL (CANCER CENTER ONLY)
Abs Immature Granulocytes: 0.04 10*3/uL (ref 0.00–0.07)
Basophils Absolute: 0 10*3/uL (ref 0.0–0.1)
Basophils Relative: 0 %
Eosinophils Absolute: 0.2 10*3/uL (ref 0.0–0.5)
Eosinophils Relative: 2 %
HCT: 42.4 % (ref 39.0–52.0)
Hemoglobin: 13.7 g/dL (ref 13.0–17.0)
Immature Granulocytes: 0 %
Lymphocytes Relative: 22 %
Lymphs Abs: 2.1 10*3/uL (ref 0.7–4.0)
MCH: 29.7 pg (ref 26.0–34.0)
MCHC: 32.3 g/dL (ref 30.0–36.0)
MCV: 92 fL (ref 80.0–100.0)
Monocytes Absolute: 0.9 10*3/uL (ref 0.1–1.0)
Monocytes Relative: 9 %
Neutro Abs: 6.4 10*3/uL (ref 1.7–7.7)
Neutrophils Relative %: 67 %
Platelet Count: 240 10*3/uL (ref 150–400)
RBC: 4.61 MIL/uL (ref 4.22–5.81)
RDW: 14 % (ref 11.5–15.5)
WBC Count: 9.6 10*3/uL (ref 4.0–10.5)
nRBC: 0 % (ref 0.0–0.2)

## 2019-07-19 LAB — CMP (CANCER CENTER ONLY)
ALT: 12 U/L (ref 0–44)
AST: 14 U/L — ABNORMAL LOW (ref 15–41)
Albumin: 3.6 g/dL (ref 3.5–5.0)
Alkaline Phosphatase: 79 U/L (ref 38–126)
Anion gap: 9 (ref 5–15)
BUN: 22 mg/dL (ref 8–23)
CO2: 29 mmol/L (ref 22–32)
Calcium: 9.3 mg/dL (ref 8.9–10.3)
Chloride: 104 mmol/L (ref 98–111)
Creatinine: 0.78 mg/dL (ref 0.61–1.24)
GFR, Est AFR Am: >60 mL/min (ref >60)
GFR, Estimated: >60 mL/min (ref >60)
Glucose, Bld: 93 mg/dL (ref 70–99)
Potassium: 3.7 mmol/L (ref 3.5–5.1)
Sodium: 142 mmol/L (ref 135–145)
Total Bilirubin: <0.2 mg/dL — ABNORMAL LOW (ref 0.3–1.2)
Total Protein: 6.4 g/dL — ABNORMAL LOW (ref 6.5–8.1)

## 2019-07-19 MED ORDER — SODIUM CHLORIDE (PF) 0.9 % IJ SOLN
INTRAMUSCULAR | Status: AC
  Filled 2019-07-19: qty 50

## 2019-07-22 ENCOUNTER — Inpatient Hospital Stay: Payer: Medicare Other | Admitting: Internal Medicine

## 2019-07-23 ENCOUNTER — Ambulatory Visit (INDEPENDENT_AMBULATORY_CARE_PROVIDER_SITE_OTHER): Payer: Medicare Other | Admitting: Family

## 2019-07-23 ENCOUNTER — Other Ambulatory Visit: Payer: Self-pay

## 2019-07-23 ENCOUNTER — Encounter: Payer: Self-pay | Admitting: Family

## 2019-07-23 ENCOUNTER — Telehealth: Payer: Self-pay | Admitting: Internal Medicine

## 2019-07-23 ENCOUNTER — Ambulatory Visit: Payer: Medicare Other | Admitting: Family

## 2019-07-23 VITALS — BP 118/64 | HR 67 | Temp 97.6°F | Resp 16 | Wt 165.0 lb

## 2019-07-23 DIAGNOSIS — Z23 Encounter for immunization: Secondary | ICD-10-CM

## 2019-07-23 DIAGNOSIS — K219 Gastro-esophageal reflux disease without esophagitis: Secondary | ICD-10-CM

## 2019-07-23 DIAGNOSIS — L729 Follicular cyst of the skin and subcutaneous tissue, unspecified: Secondary | ICD-10-CM

## 2019-07-23 MED ORDER — OMEPRAZOLE 40 MG PO CPDR: DELAYED_RELEASE_CAPSULE | ORAL | 5 refills | Status: DC

## 2019-07-23 MED FILL — OMEPRAZOLE 40 MG CPDR: 40 | 30 days supply | Qty: 30 | Fill #0

## 2019-07-23 NOTE — Progress Notes (Signed)
Subjective:    Patient ID: Robert Bates, male    DOB: [birth year], 78 y.o.   MRN: [id number]  HPI  Patient is a 78 yr old male who presents today for follow up of his left forearm abscess. Notes some improvement on keflex following I and D 1 week ago.   GERD- requesting refill on PPI, notes some + gerd symptoms.   Review of Systems See HPI  Past Medical History:  Diagnosis Date  . Alcoholism ([place])    past problem per pt but admits to still drinking - unclear how much  . Anxiety   . Arthritis   . 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  [name] COPD (chronic obstructive pulmonary disease) ([place])   . Diabetes type 2, controlled ([place]) 07/31/2017  . Dysrhythmia    a fib  . GERD (gastroesophageal reflux disease)   . Hematuria    refuses work up or referral - understands risks of morbidity / mortality - 11/2008, 12/2008  . History of hiatal hernia   . History of kidney stones   . Hyperlipemia   . 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 [name].   . Peripheral vascular disease ([place])    Abdominal Aortic Aneursym  . Pneumonia    as a child  . Radiation 09/18/15-10/25/15   left lower lobe 70.2 Gy  . Tobacco abuse      Social History   Socioeconomic History  . Marital status: Married    Spouse name: Not on file  . Number of children: 2  . Years of education: Not on file  . Highest education level: Not on file  Occupational History  . 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 Financial resource strain: Not on file  . Food insecurity    Worry: Not on file    Inability: Not on file  . Transportation needs    Medical: Not on file    Non-medical: Not on file  Tobacco Use  . Smoking status: Former Smoker    Packs/day: 1.00    Years: 57.00    Pack years: 57.00    Quit date: 08/08/2015    Years since  quitting: 3.9  . Smokeless tobacco: Former Systems developer    Types: Chew    Quit date: 11/04/1958  . Tobacco comment: occasional use sneaks around  Substance and Sexual Activity  . Alcohol use: Yes    Alcohol/week: 0.0 standard drinks    Frequency: Never    Comment: Pt reports rare use hx of denies 10-01-17  . Drug use: No  . Sexual activity: Yes  Lifestyle  . Physical activity    Days per week: Not on file    Minutes per session: Not on file  . Stress: Not on file  Relationships  . Social Herbalist on phone: Not on file    Gets together: Not on file    Attends religious service: Not on file    Active member of club or organization: Not on file    Attends meetings of clubs or organizations: Not on file    Relationship status: Not on file  . Intimate partner violence    Fear of current or ex partner: Not on file    Emotionally abused: Not on file  Physically abused: Not on file    Forced sexual activity: Not on file  Other Topics Concern  . Not on file  Social History Narrative  . Not on file    Past Surgical History:  Procedure Laterality Date  . CHOLECYSTECTOMY N/A 07/23/2017   Procedure: LAPAROSCOPIC CHOLECYSTECTOMY;  Surgeon: Kinsinger, Arta Bruce, MD;  Location: [facility];  Service: General;  Laterality: N/A;  . COLONOSCOPY    . EYE SURGERY Bilateral    Cataracts removed w/ lens implant  . HERNIA REPAIR     Left 36 years ago . Right inguinal hernia repair 10-01-17 Dr. Kieth Brightly  . INGUINAL HERNIA REPAIR Right 10/01/2017   Procedure: RIGHT INGUINAL HERNIA REPAIR WITH MESH;  Surgeon: Kinsinger, Arta Bruce, MD;  Location: [facility];  Service: General;  Laterality: Right;  TAP BLOCK  . INSERTION OF MESH Right 10/01/2017   Procedure: INSERTION OF MESH;  Surgeon: Kinsinger, Arta Bruce, MD;  Location: [facility];  Service: General;  Laterality: Right;  . TONSILLECTOMY    . TONSILLECTOMY    . VIDEO BRONCHOSCOPY Bilateral 07/26/2015   Procedure: VIDEO BRONCHOSCOPY WITH FLUORO;   Surgeon: [name], MD;  Location: [facility];  Service: Cardiopulmonary;  Laterality: Bilateral;  . VIDEO BRONCHOSCOPY WITH ENDOBRONCHIAL NAVIGATION N/A 08/23/2015   Procedure: VIDEO BRONCHOSCOPY WITH ENDOBRONCHIAL NAVIGATION;  Surgeon: Grace Isaac, MD;  Location: [facility];  Service: Thoracic;  Laterality: N/A;  . VIDEO BRONCHOSCOPY WITH ENDOBRONCHIAL ULTRASOUND N/A 08/23/2015   Procedure: VIDEO BRONCHOSCOPY WITH ENDOBRONCHIAL ULTRASOUND;  Surgeon: Grace Isaac, MD;  Location: [facility];  Service: Thoracic;  Laterality: N/A;    Family History  Problem Relation Age of Onset  . Leukemia Father   . Emphysema Father   . Learning disabilities Son   . Leukemia Other   . Stroke Other     Allergies  Allergen Reactions  . Iodine Other (See Comments)    neck swells  . Iohexol Swelling    Neck and gland swelling per patient.    Current Outpatient Medications on File Prior to Visit  Medication Sig Dispense Refill  . albuterol (PROAIR HFA) 108 (90 Base) MCG/ACT inhaler INHALE 2 PUFFS BY MOUTH INTO THE LUNGS EVERY 6 (SIX) HOURS AS NEEDED FOR WHEEZING OR SHORTNESS OF BREATH. 8.5 g 5  . CARTIA XT 120 MG 24 hr capsule TAKE 1 CAPSULE (120 MG TOTAL) BY MOUTH DAILY. 90 capsule 2  . ELIQUIS 5 MG TABS tablet TAKE 1 TABLET (5 MG TOTAL) BY MOUTH 2 (TWO) TIMES DAILY. 180 tablet 1  . Fluticasone-Salmeterol (WIXELA INHUB) 250-50 MCG/DOSE AEPB Inhale 1 puff into the lungs 2 (two) times daily. 60 each 5  . pravastatin (PRAVACHOL) 80 MG tablet TAKE 1 TABLET (80 MG TOTAL) BY MOUTH AT BEDTIME. 90 tablet 1  . predniSONE (DELTASONE) 10 MG tablet 4 tabs by mouth once daily for 2 days, then 3 tabs daily x 2 days, then 2 tabs daily x 2 days, then 1 tab daily x 2 days 20 tablet 0  . SYMBICORT 160-4.5 MCG/ACT inhaler INHALE 2 PUFFS INTO THE LUNGS 2 (TWO) TIMES DAILY. 10.2 g 5  . furosemide (LASIX) 20 MG tablet Take 1 tablet (20 mg total) by mouth daily as needed for edema. 30 tablet 1   No current  facility-administered medications on file prior to visit.     BP 118/64 (BP Location: Right Arm, Patient Position: Sitting, Cuff Size: Small)   Pulse 67   Temp 97.6 F (36.4 C) (Temporal)  Resp 16   Wt 165 lb (74.8 kg)   SpO2 98%   BMI 25.09 kg/m       Objective:   Physical Exam Constitutional:      General: He is not in acute distress.    Appearance: He is well-developed.  HENT:     Head: Normocephalic and atraumatic.  Cardiovascular:     Rate and Rhythm: Normal rate and regular rhythm.     Heart sounds: No murmur.  Pulmonary:     Effort: Pulmonary effort is normal. No respiratory distress.     Breath sounds: Normal breath sounds. No wheezing or rales.  Skin:    General: Skin is warm and dry.     Comments: Firm approximately 1.5 cm wide rubbery cyst like mass left forearm without fluctuance  Neurological:     Mental Status: He is alert and oriented to person, place, and time.  Psychiatric:        Behavior: Behavior normal.        Thought Content: Thought content normal.            Assessment & Plan:  Skin cyst- no longer appears infected.  Will refer to dermatology for excision.   GERD- uncontrolled. Restart PPI.  Flu shot today.

## 2019-07-23 NOTE — Telephone Encounter (Signed)
Returned patient's phone call regarding rescheduling an appointment, left a voicemail. 

## 2019-07-30 ENCOUNTER — Telehealth: Payer: Self-pay

## 2019-07-30 ENCOUNTER — Ambulatory Visit: Payer: Medicare Other | Admitting: Cardiology

## 2019-07-30 NOTE — Telephone Encounter (Signed)
Spoke with pt who is agreeable to seeing Vin Bhagat virtually on 9/28 at 9:30 am. Meds have been reviewed and consent has been given. I informed pt to have vitals prior to appointment. Pt verbalized understanding and thanked me for the call.     Virtual Visit Pre-Appointment Phone Call  "(Name), I am calling you today to discuss your upcoming appointment. We are currently trying to limit exposure to the virus that causes COVID-19 by seeing patients at home rather than in the office."  1. "What is the BEST phone number to call the day of the visit?" - include this in appointment notes  2. "Do you have or have access to (through a family member/friend) a smartphone with video capability that we can use for your visit?" a. If yes - list this number in appt notes as "cell" (if different from BEST phone #) and list the appointment type as a VIDEO visit in appointment notes b. If no - list the appointment type as a PHONE visit in appointment notes  3. Confirm consent - "In the setting of the current Covid19 crisis, you are scheduled for a (phone or video) visit with your provider on (date) at (time).  Just as we do with many in-office visits, in order for you to participate in this visit, we must obtain consent.  If you'd like, I can send this to your mychart (if signed up) or email for you to review.  Otherwise, I can obtain your verbal consent now.  All virtual visits are billed to your insurance company just like a normal visit would be.  By agreeing to a virtual visit, we'd like you to understand that the technology does not allow for your provider to perform an examination, and thus may limit your provider's ability to fully assess your condition. If your provider identifies any concerns that need to be evaluated in person, we will make arrangements to do so.  Finally, though the technology is pretty good, we cannot assure that it will always work on either your or our end, and in the setting of a  video visit, we may have to convert it to a phone-only visit.  In either situation, we cannot ensure that we have a secure connection.  Are you willing to proceed?" STAFF: Did the patient verbally acknowledge consent to telehealth visit? Document YES/NO here: YES  4. Advise patient to be prepared - "Two hours prior to your appointment, go ahead and check your blood pressure, pulse, oxygen saturation, and your weight (if you have the equipment to check those) and write them all down. When your visit starts, your provider will ask you for this information. If you have an Apple Watch or Kardia device, please plan to have heart rate information ready on the day of your appointment. Please have a pen and paper handy nearby the day of the visit as well."  5. Give patient instructions for MyChart download to smartphone OR Doximity/Doxy.me as below if video visit (depending on what platform provider is using)  6. Inform patient they will receive a phone call 15 minutes prior to their appointment time (may be from unknown caller ID) so they should be prepared to answer    TELEPHONE CALL NOTE  Roque Schill has been deemed a candidate for a follow-up tele-health visit to limit community exposure during the Covid-19 pandemic. I spoke with the patient via phone to ensure availability of phone/video source, confirm preferred email & phone number, and discuss instructions and  expectations.  I reminded Kasean Denherder to be prepared with any vital sign and/or heart rhythm information that could potentially be obtained via home monitoring, at the time of his visit. I reminded Rebel Willcutt to expect a phone call prior to his visit.  Jacinta Shoe, [place] 07/30/2019 2:47 PM   INSTRUCTIONS FOR DOWNLOADING THE MYCHART APP TO SMARTPHONE  - The patient must first make sure to have activated MyChart and know their login information - If Apple, go to [employer] and type in MyChart in the search bar and download the app. If  Android, ask patient to go to [employer] and type in [place] in the search bar and download the app. The app is free but as with any other app downloads, their phone may require them to verify saved payment information or Apple/Android password.  - The patient will need to then log into the app with their MyChart username and password, and select [facility] as their healthcare provider to link the account. When it is time for your visit, go to the MyChart app, find appointments, and click Begin Video Visit. Be sure to Select Allow for your device to access the Microphone and Camera for your visit. You will then be connected, and your provider will be with you shortly.  **If they have any issues connecting, or need assistance please contact MyChart service desk [phone])**  **If using a computer, in order to ensure the best quality for their visit they will need to use either of the following Internet Browsers: [employer], or Google Chrome**  IF USING DOXIMITY or DOXY.ME - The patient will receive a link just prior to their visit by text.     FULL LENGTH CONSENT FOR TELE-HEALTH VISIT   I hereby voluntarily request, consent and authorize [facility] and its employed or contracted physicians, physician assistants, nurse practitioners or other licensed health care professionals (the Practitioner), to provide me with telemedicine health care services (the "Services") as deemed necessary by the treating Practitioner. I acknowledge and consent to receive the Services by the Practitioner via telemedicine. I understand that the telemedicine visit will involve communicating with the Practitioner through live audiovisual communication technology and the disclosure of certain medical information by electronic transmission. I acknowledge that I have been given the opportunity to request an in-person assessment or other available alternative prior to the telemedicine visit and am  voluntarily participating in the telemedicine visit.  I understand that I have the right to withhold or withdraw my consent to the use of telemedicine in the course of my care at any time, without affecting my right to future care or treatment, and that the Practitioner or I may terminate the telemedicine visit at any time. I understand that I have the right to inspect all information obtained and/or recorded in the course of the telemedicine visit and may receive copies of available information for a reasonable fee.  I understand that some of the potential risks of receiving the Services via telemedicine include:  [name] Delay or interruption in medical evaluation due to technological equipment failure or disruption; . Information transmitted may not be sufficient (e.g. poor resolution of images) to allow for appropriate medical decision making by the Practitioner; and/or  . In rare instances, security protocols could fail, causing a breach of personal health information.  Furthermore, I acknowledge that it is my responsibility to provide information about my medical history, conditions and care that is complete and accurate to the best  of my ability. I acknowledge that Practitioner's advice, recommendations, and/or decision may be based on factors not within their control, such as incomplete or inaccurate data provided by me or distortions of diagnostic images or specimens that may result from electronic transmissions. I understand that the practice of medicine is not an exact science and that Practitioner makes no warranties or guarantees regarding treatment outcomes. I acknowledge that I will receive a copy of this consent concurrently upon execution via email to the email address I last provided but may also request a printed copy by calling the office of [facility].    I understand that my insurance will be billed for this visit.   I have read or had this consent read to me. . I understand the  contents of this consent, which adequately explains the benefits and risks of the Services being provided via telemedicine.  . I have been provided ample opportunity to ask questions regarding this consent and the Services and have had my questions answered to my satisfaction. . I give my informed consent for the services to be provided through the use of telemedicine in my medical care  By participating in this telemedicine visit I agree to the above.

## 2019-08-01 NOTE — Progress Notes (Signed)
Virtual Visit via Telephone Note   This visit type was conducted due to national recommendations for restrictions regarding the COVID-19 Pandemic (e.g. social distancing) in an effort to limit this patient's exposure and mitigate transmission in our community.  Due to his co-morbid illnesses, this patient is at least at moderate risk for complications without adequate follow up.  This format is felt to be most appropriate for this patient at this time.  The patient did not have access to video technology/had technical difficulties with video requiring transitioning to audio format only (telephone).  All issues noted in this document were discussed and addressed.  No physical exam could be performed with this format.  Please refer to the patient's chart for his  consent to telehealth for [facility].   Date:  08/02/2019   ID:  Robert Bates, DOB 01/28/1941, MRN [id number]  Patient Location: Home Provider Location: Home  PCP:  [name], NP  Cardiologist:  Mertie Moores, MD  Electrophysiologist:  None   Evaluation Performed:  Follow-Up Visit  Chief Complaint:  Yearly follow up  History of Present Illness:    Robert Bates is a 78 y.o. male with hx of PAF, chronic diastolic CHF, tobacco abuse, lung cancer, COPD, hypertension presents for follow up.   Hx of afib RVR in setting of acute cholecystitis. He spontaneously converted to normal sinus rhythm, but continued to have runs of AFib/Flutter w/ RVR. He had an echo that demonstrated normal ejection fraction. He underwent cholecystectomy. He was then placed on Apixaban for anticoagulation.CHADS2-VASc=4 (age, HTN, vascular dz). He was also placed on Diltiazem for rate control.  He was last seen by me 07/2018. His chronic dyspnea felt 2nd to lung cancer/COPD and ongoing tobacco smoking. No active angina or CHF.  Seen for follow up. Unfortunately, he continues to smoke but has cut back.  He takes his Eliquis was misses dose  frequently.  He prefers once a day medication.  He continues to have dyspnea on exertion without chest tightness/pressure.  No dizziness, orthopnea, PND, syncope or lower extremity edema.    The patient does not have symptoms concerning for COVID-19 infection (fever, chills, cough, or new shortness of breath).    Past Medical History:  Diagnosis Date  . Alcoholism ([place])    past problem per pt but admits to still drinking - unclear how much  . Anxiety   . Arthritis   . 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  [name] COPD (chronic obstructive pulmonary disease) ([place])   . Diabetes type 2, controlled ([place]) 07/31/2017  . Dysrhythmia    a fib  . GERD (gastroesophageal reflux disease)   . Hematuria    refuses work up or referral - understands risks of morbidity / mortality - 11/2008, 12/2008  . History of hiatal hernia   . History of kidney stones   . Hyperlipemia   . Meningioma ([place]) 10/25/2013   Follows with Dr. [name].   . Peripheral vascular disease ([place])    Abdominal Aortic Aneursym  . Pneumonia    as a child  . Radiation 09/18/15-10/25/15   left lower lobe 70.2 Gy  . Tobacco abuse    Past Surgical History:  Procedure Laterality Date  . CHOLECYSTECTOMY N/A 07/23/2017   Procedure: LAPAROSCOPIC CHOLECYSTECTOMY;  Surgeon: Kinsinger, Arta Bruce, MD;  Location: [facility];  Service: General;  Laterality: N/A;  . COLONOSCOPY    .  EYE SURGERY Bilateral    Cataracts removed w/ lens implant  . HERNIA REPAIR     Left 36 years ago . Right inguinal hernia repair 10-01-17 Dr. Kieth Brightly  . INGUINAL HERNIA REPAIR Right 10/01/2017   Procedure: RIGHT INGUINAL HERNIA REPAIR WITH MESH;  Surgeon: Kinsinger, Arta Bruce, MD;  [facility];  Service: General;  Laterality: Right;  TAP BLOCK  . INSERTION OF MESH Right 10/01/2017   Procedure: INSERTION OF MESH;  Surgeon: Kinsinger, Arta Bruce, MD;  [facility];   Service: General;  Laterality: Right;  . TONSILLECTOMY    . TONSILLECTOMY    . VIDEO BRONCHOSCOPY Bilateral 07/26/2015   Procedure: VIDEO BRONCHOSCOPY WITH FLUORO;  Surgeon: [name], MD;  [facility];  Service: Cardiopulmonary;  Laterality: Bilateral;  . VIDEO BRONCHOSCOPY WITH ENDOBRONCHIAL NAVIGATION N/A 08/23/2015   Procedure: VIDEO BRONCHOSCOPY WITH ENDOBRONCHIAL NAVIGATION;  Surgeon: Grace Isaac, MD;  Location: [place];  Service: Thoracic;  Laterality: N/A;  . VIDEO BRONCHOSCOPY WITH ENDOBRONCHIAL ULTRASOUND N/A 08/23/2015   Procedure: VIDEO BRONCHOSCOPY WITH ENDOBRONCHIAL ULTRASOUND;  Surgeon: Grace Isaac, MD;  [facility];  Service: Thoracic;  Laterality: N/A;     Current Meds  Medication Sig  . albuterol (PROAIR HFA) 108 (90 Base) MCG/ACT inhaler INHALE 2 PUFFS BY MOUTH INTO THE LUNGS EVERY 6 (SIX) HOURS AS NEEDED FOR WHEEZING OR SHORTNESS OF BREATH.  [name] CARTIA XT 120 MG 24 hr capsule TAKE 1 CAPSULE (120 MG TOTAL) BY MOUTH DAILY.  [name] Fluticasone-Salmeterol (WIXELA INHUB) 250-50 MCG/DOSE AEPB Inhale 1 puff into the lungs 2 (two) times daily.  [name] omeprazole (PRILOSEC) 40 MG capsule TAKE 1 CAPSULE BY MOUTH DAILY. PATIENT NEEDS OFFICE VISIT FOR FURTHER REFILLS  . pravastatin (PRAVACHOL) 80 MG tablet TAKE 1 TABLET (80 MG TOTAL) BY MOUTH AT BEDTIME.  . SYMBICORT 160-4.5 MCG/ACT inhaler INHALE 2 PUFFS INTO THE LUNGS 2 (TWO) TIMES DAILY.  . [DISCONTINUED] ELIQUIS 5 MG TABS tablet TAKE 1 TABLET (5 MG TOTAL) BY MOUTH 2 (TWO) TIMES DAILY.     Allergies:   Iodine and Iohexol   Social History   Tobacco Use  . Smoking status: Former Smoker    Packs/day: 1.00    Years: 57.00    Pack years: 57.00    Quit date: 08/08/2015    Years since quitting: 3.9  . Smokeless tobacco: Former Systems developer    Types: Chew    Quit date: 11/04/1958  . Tobacco comment: occasional use sneaks around  Substance Use Topics  . Alcohol use: Yes    Alcohol/week: 0.0 standard drinks     Frequency: Never    Comment: Pt reports rare use hx of denies 10-01-17  . Drug use: No     Family Hx: The patient's family history includes Emphysema in his father; Learning disabilities in his son; Leukemia in his father and another family member; Stroke in an other family member.  ROS:   Please see the history of present illness.    All other systems reviewed and are negative.   Prior CV studies:   The following studies were reviewed today:  48 hours holter 08/2017  NSR with frequent episodes of paroxysmal atrial fibrillation.  Rare PVCs  Echo 07/2017 Left ventricle: The cavity size was normal. Systolic function was   normal. The estimated ejection fraction was in the range of 55%   to 60%. Wall motion was normal; there were no regional wall   motion abnormalities. Features are consistent with a pseudonormal  left ventricular filling pattern, with concomitant abnormal   relaxation and increased filling pressure (grade 2 diastolic   dysfunction). - Aortic valve: Valve area (VTI): 2.93 cm^2. Valve area (Vmax):   2.78 cm^2. Valve area (Vmean): 2.58 cm^2. - Right atrium: The atrium was mildly dilated. - Pulmonary arteries: Systolic pressure was mildly increased. PA   peak pressure: 38 mm Hg (S).  Labs/Other Tests and Data Reviewed:    EKG:  No ECG reviewed.  Recent Labs: 07/19/2019: ALT 12; BUN 22; Creatinine 0.78; Hemoglobin 13.7; Platelet Count 240; Potassium 3.7; Sodium 142   Recent Lipid Panel Lab Results  Component Value Date/Time   CHOL 161 11/20/2018 10:40 AM   TRIG 175.0 (H) 11/20/2018 10:40 AM   HDL 52.50 11/20/2018 10:40 AM   CHOLHDL 3 11/20/2018 10:40 AM   LDLCALC 74 11/20/2018 10:40 AM   LDLDIRECT 142.2 12/26/2008 10:32 AM    Wt Readings from Last 3 Encounters:  08/02/19 165 lb (74.8 kg)  07/23/19 165 lb (74.8 kg)  07/16/19 170 lb (77.1 kg)     Objective:    Vital Signs:  Ht 5\' 8"  (1.727 m)   Wt 165 lb (74.8 kg)   BMI 25.09 kg/m    VITAL  SIGNS:  reviewed GEN:  no acute distress PSYCH:  normal affect  ASSESSMENT & PLAN:    1. PAF - No bleeding issue.    No palpitation.  Change Eliquis to Xarelto for better compliance.  Cardizem for rate control.  2. Chronic diastolic CHF -.  Asymptomatic.  No change in medical therapy.  3.  Lung cancer/COPD -Chronic stable dyspnea on exertion.  4. Tobacco smoking -He has cut back but recommended complete cessation.    COVID-19 Education: The signs and symptoms of COVID-19 were discussed with the patient and how to seek care for testing (follow up with PCP or arrange E-visit). The importance of social distancing was discussed today.  Time:   Today, I have spent 10  minutes with the patient with telehealth technology discussing the above problems.     Medication Adjustments/Labs and Tests Ordered: Current medicines are reviewed at length with the patient today.  Concerns regarding medicines are outlined above.   Tests Ordered: No orders of the defined types were placed in this encounter.   Medication Changes: Meds ordered this encounter  Medications  . rivaroxaban (XARELTO) 20 MG TABS tablet    Sig: Take 1 tablet (20 mg total) by mouth daily with supper.    Dispense:  90 tablet    Refill:  3    Follow Up:  Virtual Visit or In Person in 1 year(s)  Signed, Leanor Kail, PA  08/02/2019 9:33 AM    [place]

## 2019-08-02 ENCOUNTER — Encounter: Payer: Self-pay | Admitting: Physician Assistant

## 2019-08-02 ENCOUNTER — Telehealth (INDEPENDENT_AMBULATORY_CARE_PROVIDER_SITE_OTHER): Payer: Medicare Other | Admitting: Physician Assistant

## 2019-08-02 ENCOUNTER — Other Ambulatory Visit: Payer: Self-pay

## 2019-08-02 VITALS — Ht 68.0 in | Wt 165.0 lb

## 2019-08-02 DIAGNOSIS — I5032 Chronic diastolic (congestive) heart failure: Secondary | ICD-10-CM

## 2019-08-02 DIAGNOSIS — I480 Paroxysmal atrial fibrillation: Secondary | ICD-10-CM | POA: Diagnosis not present

## 2019-08-02 DIAGNOSIS — J449 Chronic obstructive pulmonary disease, unspecified: Secondary | ICD-10-CM

## 2019-08-02 DIAGNOSIS — Z720 Tobacco use: Secondary | ICD-10-CM

## 2019-08-02 DIAGNOSIS — I10 Essential (primary) hypertension: Secondary | ICD-10-CM

## 2019-08-02 MED ORDER — RIVAROXABAN 20 MG PO TABS: 20.0000 mg | ORAL_TABLET | Freq: Every day | ORAL | 3 refills | Status: DC

## 2019-08-02 MED FILL — XARELTO 20 MG TABLET: 20 | 30 days supply | Qty: 30 | Fill #0

## 2019-08-02 NOTE — Patient Instructions (Signed)
Medication Instructions:  Your physician has recommended you make the following change in your medication:  1.  STOP the Eliquis 2.  START Xarelto 20 mg taking 1 tablet daily.  A prescription has been sent to [facility].  If you need a refill on your cardiac medications before your next appointment, please call your pharmacy.   Lab work: None ordered  If you have labs (blood work) drawn today and your tests are completely normal, you will receive your results only by: [name] MyChart Message (if you have MyChart) OR . A paper copy in the mail If you have any lab test that is abnormal or we need to change your treatment, we will call you to review the results.  Testing/Procedures: None ordered  Follow-Up: At [facility], you and your health needs are our priority.  As part of our continuing mission to provide you with exceptional heart care, we have created designated Provider Care Teams.  These Care Teams include your primary Cardiologist (physician) and Advanced Practice Providers (APPs -  Physician Assistants and Nurse Practitioners) who all work together to provide you with the care you need, when you need it. You will need a follow up appointment in:  12 months.  Please call our office 2 months in advance to schedule this appointment.  You may see Mertie Moores, MD or one of the following Advanced Practice Providers on your designated Care Team: Richardson Dopp, PA-C [place], [place] . Daune Perch, NP  Any Other Special Instructions Will Be Listed Below (If Applicable).

## 2019-08-27 ENCOUNTER — Other Ambulatory Visit: Payer: Self-pay | Admitting: Physician Assistant

## 2019-08-27 MED FILL — CARTIA XT 120 MG CP24: 120 | 90 days supply | Qty: 90 | Fill #0

## 2019-08-27 MED FILL — OMEPRAZOLE 40 MG CPDR: 40 | 30 days supply | Qty: 30 | Fill #1

## 2019-09-16 MED FILL — PRAVASTATIN SODIUM 80 MG TA: 80 | 90 days supply | Qty: 90 | Fill #1

## 2019-09-16 MED FILL — XARELTO 20 MG TABLET: 20 | 30 days supply | Qty: 30 | Fill #1

## 2019-10-08 MED FILL — OMEPRAZOLE 40 MG CPDR: 40 | 30 days supply | Qty: 30 | Fill #2

## 2019-10-21 ENCOUNTER — Other Ambulatory Visit: Payer: Self-pay

## 2019-10-22 ENCOUNTER — Ambulatory Visit (INDEPENDENT_AMBULATORY_CARE_PROVIDER_SITE_OTHER): Payer: Medicare Other | Admitting: Family

## 2019-10-22 ENCOUNTER — Encounter: Payer: Self-pay | Admitting: Family

## 2019-10-22 ENCOUNTER — Telehealth: Payer: Self-pay | Admitting: Family

## 2019-10-22 VITALS — BP 118/74 | HR 63 | Temp 96.8°F | Resp 16 | Ht 68.0 in | Wt 163.0 lb

## 2019-10-22 DIAGNOSIS — C3432 Malignant neoplasm of lower lobe, left bronchus or lung: Secondary | ICD-10-CM

## 2019-10-22 DIAGNOSIS — R739 Hyperglycemia, unspecified: Secondary | ICD-10-CM

## 2019-10-22 DIAGNOSIS — D329 Benign neoplasm of meninges, unspecified: Secondary | ICD-10-CM

## 2019-10-22 DIAGNOSIS — B351 Tinea unguium: Secondary | ICD-10-CM

## 2019-10-22 DIAGNOSIS — E785 Hyperlipidemia, unspecified: Secondary | ICD-10-CM | POA: Diagnosis not present

## 2019-10-22 DIAGNOSIS — I10 Essential (primary) hypertension: Secondary | ICD-10-CM

## 2019-10-22 MED ORDER — RIVAROXABAN 20 MG PO TABS: 20.0000 mg | ORAL_TABLET | Freq: Every day | ORAL | 0 refills | Status: DC

## 2019-10-22 NOTE — Progress Notes (Signed)
Subjective:    Patient ID: Robert Bates, male    DOB: 1941/04/04, 78 y.o.   MRN: [id number]  HPI  Patient is a 78 yr old male who presents today for follow up.  DM2- diet controlled.  Lab Results  Component Value Date   HGBA1C 6.2 11/20/2018   HGBA1C 6.1 12/25/2017   HGBA1C 5.9 (H) 09/30/2017   Lab Results  Component Value Date   LDLCALC 74 11/20/2018   CREATININE 0.78 07/19/2019   GERD- maintained on PPI. Denies GERD.   Hyperlipidemia- maintained on pravastatin. Lab Results  Component Value Date   CHOL 161 11/20/2018   HDL 52.50 11/20/2018   LDLCALC 74 11/20/2018   LDLDIRECT 142.2 12/26/2008   TRIG 175.0 (H) 11/20/2018   CHOLHDL 3 11/20/2018   HTN- maintained on cartia xt. BP Readings from Last 3 Encounters:  10/22/19 118/74  07/23/19 118/64  07/16/19 124/80   COPD/Hx of lung cancer-he had a follow-up CT scan which was ordered by hematology last fall.  CT appeared stable.  No sign of recurrence.  Reports that his breathing has been stable. He has not used his symbicort lately.   Meningioma- last June we made a referral back to neurosurgery.  He states that he saw neurosurgery.  We have not received a note from them.  PAF- maintained on Cartia/coumadin.    Review of Systems See HPI  Past Medical History:  Diagnosis Date  . Alcoholism ([place])    past problem per pt but admits to still drinking - unclear how much  . Anxiety   . Arthritis   . 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  [name] COPD (chronic obstructive pulmonary disease) ([place])   . Diabetes type 2, controlled ([place]) 07/31/2017  . Dysrhythmia    a fib  . GERD (gastroesophageal reflux disease)   . Hematuria    refuses work up or referral - understands risks of morbidity / mortality - 11/2008, 12/2008  . History of hiatal hernia   . History of kidney stones   . Hyperlipemia   . Meningioma ([place]) 10/25/2013   Follows  with Dr. [name].   . Peripheral vascular disease ([place])    Abdominal Aortic Aneursym  . Pneumonia    as a child  . Radiation 09/18/15-10/25/15   left lower lobe 70.2 Gy  . Tobacco abuse      Social History   Socioeconomic History  . Marital status: Married    Spouse name: Not on file  . Number of children: 2  . Years of education: Not on file  . Highest education level: Not on file  Occupational History  . Occupation: Retired    Fish farm manager: [employer]    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[place] Use  . Smoking status: Former Smoker    Packs/day: 1.00    Years: 57.00    Pack years: 57.00    Quit date: 08/08/2015    Years since quitting: 4.2  . Smokeless tobacco: Former Systems developer    Types: Chew    Quit date: 11/04/1958  . Tobacco comment: occasional use sneaks around  Substance and Sexual Activity  . Alcohol use: Yes    Alcohol/week: 0.0 standard drinks    Comment: Pt reports rare use hx of denies 10-01-17  . Drug use: No  . Sexual activity: Yes  Other Topics Concern  . Not on file  Social History Narrative  . Not on file   Social Determinants of Health   Financial Resource Strain:   . Difficulty of Paying Living Expenses: Not on file  Food Insecurity:   . Worried About Charity fundraiser in the Last Year: Not on file  . Ran Out of Food in the Last Year: Not on file  Transportation Needs:   . Lack of Transportation (Medical): Not on file  . Lack of Transportation (Non-Medical): Not on file  Physical Activity:   . Days of Exercise per Week: Not on file  . Minutes of Exercise per Session: Not on file  Stress:   . Feeling of Stress : Not on file  Social Connections:   . Frequency of Communication with Friends and Family: Not on file  . Frequency of Social Gatherings with Friends and Family: Not on file  . Attends Religious Services: Not on file  . Active Member of Clubs or Organizations: Not on file  . Attends Archivist Meetings: Not  on file  . Marital Status: Not on file  Intimate Partner Violence:   . Fear of Current or Ex-Partner: Not on file  . Emotionally Abused: Not on file  . Physically Abused: Not on file  . Sexually Abused: Not on file    Past Surgical History:  Procedure Laterality Date  . CHOLECYSTECTOMY N/A 07/23/2017   Procedure: LAPAROSCOPIC CHOLECYSTECTOMY;  Surgeon: Kinsinger, Arta Bruce, MD;  [facility];  Service: General;  Laterality: N/A;  . COLONOSCOPY    . EYE SURGERY Bilateral    Cataracts removed w/ lens implant  . HERNIA REPAIR     Left 36 years ago . Right inguinal hernia repair 10-01-17 Dr. Kieth Brightly  . INGUINAL HERNIA REPAIR Right 10/01/2017   Procedure: RIGHT INGUINAL HERNIA REPAIR WITH MESH;  Surgeon: Kinsinger, Arta Bruce, MD;  [facility];  Service: General;  Laterality: Right;  TAP BLOCK  . INSERTION OF MESH Right 10/01/2017   Procedure: INSERTION OF MESH;  Surgeon: Kinsinger, Arta Bruce, MD;  [facility];  Service: General;  Laterality: Right;  . TONSILLECTOMY    . TONSILLECTOMY    . VIDEO BRONCHOSCOPY Bilateral 07/26/2015   Procedure: VIDEO BRONCHOSCOPY WITH FLUORO;  Surgeon: [name], MD;  [facility];  Service: Cardiopulmonary;  Laterality: Bilateral;  . VIDEO BRONCHOSCOPY WITH ENDOBRONCHIAL NAVIGATION N/A 08/23/2015   Procedure: VIDEO BRONCHOSCOPY WITH ENDOBRONCHIAL NAVIGATION;  Surgeon: Grace Isaac, MD;  Location: [facility];  Service: Thoracic;  Laterality: N/A;  . VIDEO BRONCHOSCOPY WITH ENDOBRONCHIAL ULTRASOUND N/A 08/23/2015   Procedure: VIDEO BRONCHOSCOPY WITH ENDOBRONCHIAL ULTRASOUND;  Surgeon: Grace Isaac, MD;  [facility];  Service: Thoracic;  Laterality: N/A;    Family History  Problem Relation Age of Onset  . Leukemia Father   . Emphysema Father   . Learning disabilities Son   . Leukemia Other   . Stroke Other     Allergies  Allergen Reactions  . Iodine Other (See Comments)    neck swells  . Iohexol Swelling     Neck and gland swelling per patient.    Current Outpatient Medications on File Prior to Visit  Medication Sig Dispense Refill  . albuterol (PROAIR HFA) 108 (90 Base) MCG/ACT inhaler INHALE 2 PUFFS BY MOUTH INTO THE LUNGS EVERY 6 (SIX) HOURS AS NEEDED FOR WHEEZING OR SHORTNESS OF BREATH. 8.5 g 5  . CARTIA XT 120 MG 24 hr capsule TAKE 1 CAPSULE (120 MG TOTAL) BY MOUTH  DAILY. 90 capsule 2  . Fluticasone-Salmeterol (WIXELA INHUB) 250-50 MCG/DOSE AEPB Inhale 1 puff into the lungs 2 (two) times daily. 60 each 5  . omeprazole (PRILOSEC) 40 MG capsule TAKE 1 CAPSULE BY MOUTH DAILY. PATIENT NEEDS OFFICE VISIT FOR FURTHER REFILLS 30 capsule 5  . pravastatin (PRAVACHOL) 80 MG tablet TAKE 1 TABLET (80 MG TOTAL) BY MOUTH AT BEDTIME. 90 tablet 1  . rivaroxaban (XARELTO) 20 MG TABS tablet Take 1 tablet (20 mg total) by mouth daily with supper. 90 tablet 3  . SYMBICORT 160-4.5 MCG/ACT inhaler INHALE 2 PUFFS INTO THE LUNGS 2 (TWO) TIMES DAILY. 10.2 g 5  . furosemide (LASIX) 20 MG tablet Take 1 tablet (20 mg total) by mouth daily as needed for edema. 30 tablet 1   No current facility-administered medications on file prior to visit.    BP 118/74 (BP Location: Right Arm, Patient Position: Sitting, Cuff Size: Small)   Pulse 63   Temp (!) 96.8 F (36 C) (Temporal)   Resp 16   Ht 5\' 8"  (1.727 m)   Wt 163 lb (73.9 kg)   SpO2 100%   BMI 24.78 kg/m       Objective:   Physical Exam Constitutional:      General: He is not in acute distress.    Appearance: He is well-developed.  HENT:     Head: Normocephalic and atraumatic.  Cardiovascular:     Rate and Rhythm: Normal rate and regular rhythm.     Heart sounds: No murmur.  Pulmonary:     Effort: Pulmonary effort is normal. No respiratory distress.     Breath sounds: Normal breath sounds. No wheezing or rales.  Skin:    General: Skin is warm and dry.  Neurological:     Mental Status: He is alert and oriented to person, place, and time.  Psychiatric:         Behavior: Behavior normal.        Thought Content: Thought content normal.           Assessment & Plan:  Hypertension-blood pressure stable.  Continue current medication.  Diabetes type 2-clinically stable obtain A1c.  History of meningioma-we will request records from neurosurgery from his most recent office visit.  COPD/history of lung cancer-CT looks stable.  He has not seen Dr. Earlie Server for office follow-up in greater than 1 year.  I have advised him to contact his office to schedule a follow-up visit.  I have also advised him to resume use of his daily Symbicort.  New  Paroxysmal atrial fibrillation-he is in the donut hole until January 1.  States that he has 1 week that will be uncovered.  He will then be able to pick back up November 05, 2019 without a high co-pay.  I checked unfortunately we do not have any samples.  I did give him a prescription for 7-day supply but he can pay cash for it to tide him over until January 1.  Advised him that further refills should come from cardiology.  Rate is stable today.  Continue Cartia.  Onychomycosis-will refer to podiatry.  This visit occurred during the SARS-CoV-2 public health emergency.  Safety protocols were in place, including screening questions prior to the visit, additional usage of staff PPE, and extensive cleaning of exam room while observing appropriate contact time as indicated for disinfecting solutions.

## 2019-10-22 NOTE — Patient Instructions (Addendum)
Please contact Dr. Arvilla Market office to schedule a follow up- [phone] Restart symbicort. Complete lab work prior to leaving.

## 2019-10-22 NOTE — Telephone Encounter (Signed)
Please contact [facility] to request most recent office note.

## 2019-10-22 NOTE — Telephone Encounter (Signed)
Per [facility] patient was last seen in 2014. Their note from that date is in or records. Provider was notified.

## 2019-10-27 ENCOUNTER — Telehealth: Payer: Self-pay | Admitting: Family

## 2019-10-27 NOTE — Telephone Encounter (Signed)
Called pt no answer twice today. Will try again next week

## 2019-10-27 NOTE — Telephone Encounter (Signed)
Please contact pt to schedule lab appointment. Looks like he forgot to go on his way out.

## 2019-10-30 ENCOUNTER — Other Ambulatory Visit: Payer: Self-pay

## 2019-10-30 ENCOUNTER — Emergency Department: Payer: Worker's Compensation

## 2019-10-30 ENCOUNTER — Emergency Department
Admission: EM | Admit: 2019-10-30 | Discharge: 2019-10-30 | Disposition: A | Discharge status: Home / Self Care | Payer: Worker's Compensation | Attending: Student | Admitting: Student

## 2019-10-30 DIAGNOSIS — Z7901 Long term (current) use of anticoagulants: Secondary | ICD-10-CM | POA: Insufficient documentation

## 2019-10-30 DIAGNOSIS — Y939 Activity, unspecified: Secondary | ICD-10-CM | POA: Diagnosis not present

## 2019-10-30 DIAGNOSIS — S3210XA Unspecified fracture of sacrum, initial encounter for closed fracture: Secondary | ICD-10-CM | POA: Diagnosis not present

## 2019-10-30 DIAGNOSIS — Z87891 Personal history of nicotine dependence: Secondary | ICD-10-CM | POA: Diagnosis not present

## 2019-10-30 DIAGNOSIS — W108XXA Fall (on) (from) other stairs and steps, initial encounter: Secondary | ICD-10-CM | POA: Diagnosis not present

## 2019-10-30 DIAGNOSIS — Z79899 Other long term (current) drug therapy: Secondary | ICD-10-CM | POA: Insufficient documentation

## 2019-10-30 DIAGNOSIS — Y929 Unspecified place or not applicable: Secondary | ICD-10-CM | POA: Insufficient documentation

## 2019-10-30 DIAGNOSIS — E119 Type 2 diabetes mellitus without complications: Secondary | ICD-10-CM | POA: Insufficient documentation

## 2019-10-30 DIAGNOSIS — Y990 Civilian activity done for income or pay: Secondary | ICD-10-CM | POA: Diagnosis not present

## 2019-10-30 DIAGNOSIS — W19XXXA Unspecified fall, initial encounter: Secondary | ICD-10-CM

## 2019-10-30 DIAGNOSIS — J449 Chronic obstructive pulmonary disease, unspecified: Secondary | ICD-10-CM | POA: Insufficient documentation

## 2019-10-30 DIAGNOSIS — S3992XA Unspecified injury of lower back, initial encounter: Secondary | ICD-10-CM | POA: Diagnosis present

## 2019-10-30 DIAGNOSIS — M7918 Myalgia, other site: Secondary | ICD-10-CM

## 2019-10-30 MED ORDER — ACETAMINOPHEN 500 MG PO TABS
1000.0000 mg | ORAL_TABLET | Freq: Once | ORAL | Status: AC
  Administered 2019-10-30: 1000 mg via ORAL
  Filled 2019-10-30: qty 2

## 2019-10-30 MED ORDER — OXYCODONE HCL 5 MG PO TABS
5.0000 mg | ORAL_TABLET | Freq: Once | ORAL | Status: AC
  Administered 2019-10-30: 5 mg via ORAL
  Filled 2019-10-30: qty 1

## 2019-10-30 MED ORDER — OXYCODONE HCL 5 MG PO TABS: 5.0000 mg | ORAL_TABLET | Freq: Four times a day (QID) | ORAL | 0 refills | Status: DC | PRN

## 2019-10-30 NOTE — ED Notes (Signed)
Pt ambulatory to toilet; given coffee and water to drink as requested

## 2019-10-30 NOTE — ED Notes (Signed)
No peripheral IV placed this visit.   Discharge instructions reviewed with patient. Questions fielded by this RN. Patient verbalizes understanding of instructions. Patient discharged home in stable condition per monks. No acute distress noted at time of discharge.   Pt ambulatory to lobby to wait for ride; given coffee to drink

## 2019-10-30 NOTE — ED Triage Notes (Signed)
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

## 2019-10-30 NOTE — ED Provider Notes (Signed)
[facility] Emergency Department Provider Note  ____________________________________________   First MD Initiated Contact with Patient 10/30/19 1908     (approximate)  I have reviewed the triage vital signs and the nursing notes.  History  Chief Complaint Fall    HPI Robert Bates is a 78 y.o. male pAF on anticoagulation, who presents for a fall. Patient works as a Administrator. He was stepping down between two trucks when he missed a step and fell to the ground on his buttocks. He states he fell directly onto his tailbone. He denies any head injury or loss of consciousness. He reports a throbbing type pain, moderate to severe, located to his tailbone. No radiation. No alleviating or aggravating factors. Constant. He denies any LE weakness, numbness, tingling. He denies any other pain, no extremity pain, neck pain, chest pain, abdominal pain.    Past Medical Hx Past Medical History:  Diagnosis Date  . Alcoholism ([place])    past problem per pt but admits to still drinking - unclear how much  . Anxiety   . Arthritis   . 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  [name] COPD (chronic obstructive pulmonary disease) ([place])   . Diabetes type 2, controlled ([place]) 07/31/2017  . Dysrhythmia    a fib  . GERD (gastroesophageal reflux disease)   . Hematuria    refuses work up or referral - understands risks of morbidity / mortality - 11/2008, 12/2008  . History of hiatal hernia   . History of kidney stones   . Hyperlipemia   . Meningioma ([place]) 10/25/2013   Follows with Dr. [name].   . Peripheral vascular disease ([place])    Abdominal Aortic Aneursym  . Pneumonia    as a child  . Radiation 09/18/15-10/25/15   left lower lobe 70.2 Gy  . Tobacco abuse     Problem List Patient Active Problem List   Diagnosis Date Noted  . PAF (paroxysmal atrial fibrillation) ([place]) 08/15/2017  .  Diabetes type 2, controlled ([place]) 07/31/2017  . Cholecystitis 07/21/2017  . COPD GOLD II  04/03/2017  . 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 Lung cancer ([place]) 11/07/2014  . 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 HTN (hypertension) 04/29/2014  . Meningioma ([place]) 10/25/2013  . Low back pain 10/25/2013  . 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 KERATOSIS 10/09/2010  . SCIATICA, RIGHT 10/09/2010  . UNSPECIFIED HEARING LOSS 05/21/2010  . Hyperlipidemia 05/14/2010  . 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[place]) 01/29/2010  . LIPOMA 11/17/2009  . MICROSCOPIC HEMATURIA 08/11/2008  . GERD 07/26/2008  . DERMOID CYST 07/25/2008    Past Surgical Hx Past Surgical History:  Procedure Laterality Date  . CHOLECYSTECTOMY N/A 07/23/2017   Procedure: LAPAROSCOPIC CHOLECYSTECTOMY;  Surgeon: Kinsinger, Arta Bruce, MD;  [facility];  Service: General;  Laterality: N/A;  . COLONOSCOPY    . EYE SURGERY Bilateral    Cataracts removed w/ lens implant  . HERNIA REPAIR     Left 36 years ago . Right inguinal hernia repair 10-01-17 Dr. Kieth Brightly  . INGUINAL HERNIA REPAIR Right 10/01/2017   Procedure: RIGHT INGUINAL HERNIA REPAIR WITH MESH;  Surgeon: Kinsinger, Arta Bruce, MD;  [facility];  Service: General;  Laterality: Right;  TAP BLOCK  . INSERTION OF MESH Right 10/01/2017   Procedure: INSERTION OF  MESH;  Surgeon: Kinsinger, Arta Bruce, MD;  [facility];  Service: General;  Laterality: Right;  . TONSILLECTOMY    . TONSILLECTOMY    . VIDEO BRONCHOSCOPY Bilateral 07/26/2015   Procedure: VIDEO BRONCHOSCOPY WITH FLUORO;  Surgeon: [name], MD;  [facility];  Service: Cardiopulmonary;  Laterality: Bilateral;  . VIDEO BRONCHOSCOPY WITH ENDOBRONCHIAL NAVIGATION N/A 08/23/2015   Procedure: VIDEO BRONCHOSCOPY WITH ENDOBRONCHIAL NAVIGATION;  Surgeon: Grace Isaac, MD;  Location: [place];  Service:  Thoracic;  Laterality: N/A;  . VIDEO BRONCHOSCOPY WITH ENDOBRONCHIAL ULTRASOUND N/A 08/23/2015   Procedure: VIDEO BRONCHOSCOPY WITH ENDOBRONCHIAL ULTRASOUND;  Surgeon: Grace Isaac, MD;  [facility];  Service: Thoracic;  Laterality: N/A;    Medications Prior to Admission medications   Medication Sig Start Date End Date Taking? Authorizing Provider  albuterol (PROAIR HFA) 108 (90 Base) MCG/ACT inhaler INHALE 2 PUFFS BY MOUTH INTO THE LUNGS EVERY 6 (SIX) HOURS AS NEEDED FOR WHEEZING OR SHORTNESS OF BREATH. 11/20/18   [name], NP  CARTIA XT 120 MG 24 hr capsule TAKE 1 CAPSULE (120 MG TOTAL) BY MOUTH DAILY. 08/27/19   Nahser, Wonda Cheng, MD  Fluticasone-Salmeterol [facility] INHUB) 250-50 MCG/DOSE AEPB Inhale 1 puff into the lungs 2 (two) times daily. 03/30/19   [name], NP  furosemide (LASIX) 20 MG tablet Take 1 tablet (20 mg total) by mouth daily as needed for edema. 07/08/18 10/06/18  Bhagat, Crista Luria, PA  omeprazole (PRILOSEC) 40 MG capsule TAKE 1 CAPSULE BY MOUTH DAILY. PATIENT NEEDS OFFICE VISIT FOR FURTHER REFILLS 07/23/19   [name], NP  pravastatin (PRAVACHOL) 80 MG tablet TAKE 1 TABLET (80 MG TOTAL) BY MOUTH AT BEDTIME. 05/19/19   [name], NP  rivaroxaban (XARELTO) 20 MG TABS tablet Take 1 tablet (20 mg total) by mouth daily with supper. 10/22/19   [name], NP  SYMBICORT 160-4.5 MCG/ACT inhaler INHALE 2 PUFFS INTO THE LUNGS 2 (TWO) TIMES DAILY. 05/07/19   [name], NP    Allergies Iodine and Iohexol  Family Hx Family History  Problem Relation Age of Onset  . Leukemia Father   . Emphysema Father   . Learning disabilities Son   . Leukemia Other   . Stroke Other     Social Hx Social History   Tobacco Use  . Smoking status: Former Smoker    Packs/day: 1.00    Years: 57.00    Pack years: 57.00    Quit date: 08/08/2015    Years since quitting: 4.2  . Smokeless tobacco: Former Systems developer    Types: Chew    Quit  date: 11/04/1958  . Tobacco comment: occasional use sneaks around  Substance Use Topics  . Alcohol use: Yes    Alcohol/week: 0.0 standard drinks    Comment: Pt reports rare use hx of denies 10-01-17  . Drug use: No     Review of Systems  Constitutional: Negative for fever, chills. Eyes: Negative for visual changes. ENT: Negative for sore throat. Cardiovascular: Negative for chest pain. Respiratory: Negative for shortness of breath. Gastrointestinal: Negative for nausea, vomiting.  Genitourinary: Negative for dysuria. Musculoskeletal: Negative for leg swelling. + tailbone pain Skin: Negative for rash. Neurological: Negative for for headaches.   Physical Exam  Vital Signs: ED Triage Vitals [10/30/19 1859]  Enc Vitals Group     BP (!) 148/89     Pulse Rate 96     Resp 16     Temp 98 F (36.7 C)  Temp Source Oral     SpO2 98 %     Weight 163 lb (73.9 kg)     Height 5\' 9"  (1.753 m)     Head Circumference      Peak Flow      Pain Score 10     Pain Loc      Pain Edu?      Excl. in [place]?     Constitutional: Alert and oriented.  Head: Normocephalic. Atraumatic. Eyes: Conjunctivae clear. Sclera anicteric. Nose: No congestion. No rhinorrhea. Mouth/Throat: Wearing mask.  Neck: No stridor.  FROM. No midline CS tenderness.  Cardiovascular: Normal rate, regular rhythm. Extremities well perfused. Respiratory: Normal respiratory effort.  Lungs CTAB. Chest wall stable, NT. No crepitance.  Gastrointestinal: Soft. Non-tender. Non-distended.  Pelvis: Stable and NT with AP and lateral compression.  Musculoskeletal: No lower extremity edema. No deformities. Back: No midline C/T/L spine tenderness. TTP to distal sacrum/coccyx. No step offs or deformities. Neurologic:  Normal speech and language. No gross focal neurologic deficits are appreciated. BLE strength 5/5 and symmetric. SILT. Ambulatory with steady gait.  Skin: Skin is warm, dry and intact. No rash noted. Psychiatric: Mood  and affect are appropriate for situation.  EKG  N/A   Radiology  CT L spine:  IMPRESSION:  1. No evidence of acute lumbar spine fracture, traumatic subluxation  or static signs of instability.  2. Mild multilevel spondylosis as described. There is moderate  multifactorial spinal stenosis at L4-5 and mild spinal stenosis at  L3-4, similar to previous abdominal CT.  3. Aortic Atherosclerosis and ectasia (ICD10-I70.0).   CT Pelvis:  IMPRESSION:  1. Suspected subtle nondisplaced fracture through the 5th sacral  segment.  2. No other pelvic fractures identified.  3. Large stellate calcification in the left bladder near the  ureterovesical junction, likely an atypical bladder calculus. No  evidence of ureteral calculus or hydronephrosis.  4. Aortic Atherosclerosis (ICD10-I70.0).   CT head and CS: IMPRESSION:  No acute intracranial abnormalities.   Two stable extra-axial masses, located at anterior skull base and  RIGHT vertex as above consistent with meningiomas.   Degenerative disc and facet disease changes of the cervical spine.   No acute cervical spine abnormalities.    Procedures  Procedure(s) performed (including critical care):  Procedures   Initial Impression / Assessment and Plan / ED Course  78 y.o. male who presents to the ED for tailbone pain after fall, as above.   Will obtain imaging to evaluate for any traumatic injuries.   CT imaging reveals suspected, subtle nondisplaced fx through 5th sacral segment, which does correlate to his area of tenderness. No other traumatic injuries. Noted meningiomas on CT head and discussed w/ patient, patient is aware of these and is being followed for them.   Pain adequately controlled with PO medications, he is neurologically in tact and ambulatory w/o issue in the ED. Discussed with orthopedics, who agree with pain control and outpatient follow up. Discussed plan of care w/ patient who is agreeable. Will Rx short  course pain control as need. Advised 2-3 days rest and given return precautions.    Final Clinical Impression(s) / ED Diagnosis  Final diagnoses:  Fall, initial encounter  Buttock pain  Closed fracture of sacrum, unspecified portion of sacrum, initial encounter [facility])       Note:  This document was prepared using Dragon voice recognition software and may include unintentional dictation errors.   Lilia Pro., MD 10/31/19 [phone]

## 2019-10-30 NOTE — ED Notes (Addendum)
Pt is workman's comp; [facility] has no profile for pt's employer :[employer]  Pt advised to contact his employer to ascertain need for blood or urine screen prior to leaving ED

## 2019-10-30 NOTE — Discharge Instructions (Signed)
Thank you for letting us take care of you in the emergency department today.   Your CT scan showed: IMPRESSION: 1. Suspected subtle nondisplaced fracture through the 5th sacral segment.  Please continue to take any regular, prescribed medications.   New medications we have prescribed:  - Oxycodone - for pain you cannot control with ibuprofen or Tylenol  Please follow up with: - Your primary care doctor to review your ER visit and follow up on your symptoms. You will need a referral for follow up with a spine doctor (likely orthopedics) for your sacral fracture.   Please return to the ER for any new or worsening symptoms.

## 2019-10-30 NOTE — ED Notes (Signed)
Patient transported to CT 

## 2019-10-30 NOTE — ED Notes (Addendum)
Pt reports falling onto buttocks from a distance of 5-6 feet, "throbbing" pain at buttocks  CMS intact to extremities, equal strength noted

## 2019-10-30 NOTE — ED Notes (Signed)
Pt reports multiple attempts to contact supervisor for drug screen are ineffective; pt to be DC'd att

## 2019-11-01 ENCOUNTER — Other Ambulatory Visit: Payer: Self-pay

## 2019-11-01 NOTE — Telephone Encounter (Signed)
Scheduled

## 2019-11-02 ENCOUNTER — Ambulatory Visit (INDEPENDENT_AMBULATORY_CARE_PROVIDER_SITE_OTHER): Payer: Medicare Other | Admitting: Family Medicine

## 2019-11-02 ENCOUNTER — Encounter: Payer: Self-pay | Admitting: Family Medicine

## 2019-11-02 DIAGNOSIS — S3210XA Unspecified fracture of sacrum, initial encounter for closed fracture: Secondary | ICD-10-CM | POA: Diagnosis not present

## 2019-11-02 MED ORDER — TRAMADOL HCL 50 MG PO TABS: 50.0000 mg | ORAL_TABLET | Freq: Three times a day (TID) | ORAL | 0 refills | Status: AC | PRN

## 2019-11-02 NOTE — Progress Notes (Signed)
No chief complaint on file.   Subjective: Patient is a 78 y.o. male here for ED f/u. Due to COVID-19 pandemic, we are interacting via telephone. I verified patient's ID using 2 identifiers. Patient agreed to proceed with visit via this method. Patient is at home, I am at office. Patient and I are present for visit.   Fell on tailbone 2 d ago on a step. Broke it, went to ED. Having pain and thinks he is having swelling. Has been sitting on a cushion. Was supposed to f/u with ortho outpatient. Using Tylenol currently.   ROS: MSK: +tailbone pain  Past Medical History:  Diagnosis Date  . Alcoholism ([place])    past problem per pt but admits to still drinking - unclear how much  . Anxiety   . Arthritis   . 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  [name] COPD (chronic obstructive pulmonary disease) ([place])   . Diabetes type 2, controlled ([place]) 07/31/2017  . Dysrhythmia    a fib  . GERD (gastroesophageal reflux disease)   . Hematuria    refuses work up or referral - understands risks of morbidity / mortality - 11/2008, 12/2008  . History of hiatal hernia   . History of kidney stones   . Hyperlipemia   . Meningioma ([place]) 10/25/2013   Follows with Dr. [name].   . Peripheral vascular disease ([place])    Abdominal Aortic Aneursym  . Pneumonia    as a child  . Radiation 09/18/15-10/25/15   left lower lobe 70.2 Gy  . Tobacco abuse     Objective: No conversational dyspnea Age appropriate judgment and insight Nml affect and mood  Assessment and Plan: Closed fracture of sacrum, unspecified portion of sacrum, initial encounter ([place]) - Plan: Ambulatory referral to Orthopedic Surgery, traMADol (ULTRAM) 50 MG tablet  Orders as above. Ice. Warning about Ultram given. Letter for work excusing for next week.  Total time: 11 min The patient voiced understanding and agreement to the plan.  [place],  DO 11/02/19  1:28 PM

## 2019-11-02 NOTE — Progress Notes (Unsigned)
ther

## 2019-11-04 MED FILL — oxyCODONE HCL 5 MG TABS: 5 | 3 days supply | Qty: 12 | Fill #0

## 2019-11-04 MED FILL — XARELTO 20 MG TABLET: 20 | 7 days supply | Qty: 7 | Fill #0

## 2019-11-08 ENCOUNTER — Other Ambulatory Visit (INDEPENDENT_AMBULATORY_CARE_PROVIDER_SITE_OTHER): Payer: Medicare Other

## 2019-11-08 ENCOUNTER — Other Ambulatory Visit: Payer: Self-pay

## 2019-11-08 ENCOUNTER — Encounter: Payer: Self-pay | Admitting: Family Medicine

## 2019-11-08 ENCOUNTER — Telehealth: Payer: Self-pay | Admitting: *Deleted

## 2019-11-08 DIAGNOSIS — M2560 Stiffness of unspecified joint, not elsewhere classified: Secondary | ICD-10-CM

## 2019-11-08 DIAGNOSIS — R5383 Other fatigue: Secondary | ICD-10-CM | POA: Diagnosis not present

## 2019-11-08 NOTE — Telephone Encounter (Signed)
Letter done and patient notified Faxed to his work and also he picked up a copy for his own records

## 2019-11-08 NOTE — Telephone Encounter (Signed)
We can write him out until he is in with ortho. I will defer to them after that. Ty.

## 2019-11-08 NOTE — Telephone Encounter (Signed)
Pt here for lab appt today. States he has not been contacted by Ortho yet.  Gave pt contact # for ortho. Pt states he doesn't think he should return to work until after he has seen Ortho and is requesting work note. 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  Please advise?

## 2019-11-09 ENCOUNTER — Ambulatory Visit: Payer: Medicare Other | Admitting: Family Medicine

## 2019-11-09 LAB — COMPREHENSIVE METABOLIC PANEL
ALT: 13 U/L (ref 0–53)
AST: 13 U/L (ref 0–37)
Albumin: 3.9 g/dL (ref 3.5–5.2)
Alkaline Phosphatase: 87 U/L (ref 39–117)
BUN: 14 mg/dL (ref 6–23)
CO2: 30 mEq/L (ref 19–32)
Calcium: 9.6 mg/dL (ref 8.4–10.5)
Chloride: 101 mEq/L (ref 96–112)
Creatinine, Ser: 0.91 mg/dL (ref 0.40–1.50)
GFR: 80.5 mL/min (ref >60.00)
Glucose, Bld: 103 mg/dL — ABNORMAL HIGH (ref 70–99)
Potassium: 4.4 mEq/L (ref 3.5–5.1)
Sodium: 140 mEq/L (ref 135–145)
Total Bilirubin: 0.5 mg/dL (ref 0.2–1.2)
Total Protein: 6.2 g/dL (ref 6.0–8.3)

## 2019-11-09 LAB — CBC WITH DIFFERENTIAL/PLATELET
Basophils Absolute: 0.1 10*3/uL (ref 0.0–0.1)
Basophils Relative: 1.1 % (ref 0.0–3.0)
Eosinophils Absolute: 0.1 10*3/uL (ref 0.0–0.7)
Eosinophils Relative: 1.6 % (ref 0.0–5.0)
HCT: 45.5 % (ref 39.0–52.0)
Hemoglobin: 15 g/dL (ref 13.0–17.0)
Lymphocytes Relative: 16.9 % (ref 12.0–46.0)
Lymphs Abs: 1.4 10*3/uL (ref 0.7–4.0)
MCHC: 33.1 g/dL (ref 30.0–36.0)
MCV: 93.4 fl (ref 78.0–100.0)
Monocytes Absolute: 0.7 10*3/uL (ref 0.1–1.0)
Monocytes Relative: 8.8 % (ref 3.0–12.0)
Neutro Abs: 5.9 10*3/uL (ref 1.4–7.7)
Neutrophils Relative %: 71.6 % (ref 43.0–77.0)
Platelets: 258 10*3/uL (ref 150.0–400.0)
RBC: 4.87 Mil/uL (ref 4.22–5.81)
RDW: 14.4 % (ref 11.5–15.5)
WBC: 8.3 10*3/uL (ref 4.0–10.5)

## 2019-11-09 LAB — SEDIMENTATION RATE: Sed Rate: 4 mm/hr (ref 0–20)

## 2019-11-09 LAB — TSH: TSH: 1.31 u[IU]/mL (ref 0.35–4.50)

## 2019-11-10 ENCOUNTER — Telehealth: Payer: Self-pay | Admitting: Family

## 2019-11-10 DIAGNOSIS — R768 Other specified abnormal immunological findings in serum: Secondary | ICD-10-CM

## 2019-11-10 LAB — ANTI-NUCLEAR AB-TITER (ANA TITER): ANA Titer 1: 1:320 {titer} — ABNORMAL HIGH

## 2019-11-10 LAB — ANA: Anti Nuclear Antibody (ANA): POSITIVE — AB

## 2019-11-10 MED FILL — XARELTO 20 MG TABLET: 20 | 30 days supply | Qty: 30 | Fill #2

## 2019-11-10 NOTE — Telephone Encounter (Signed)
Patient with joint pain and stiffness, + ANA.  Please advise him that I would like to refer him to a rheumatologist for further evaluation.

## 2019-11-18 ENCOUNTER — Ambulatory Visit: Payer: Medicare Other | Admitting: Podiatry

## 2019-12-03 MED FILL — OMEPRAZOLE 40 MG CPDR: 40 | 30 days supply | Qty: 30 | Fill #3

## 2019-12-13 MED FILL — CARTIA XT 120 MG CAPSULE: 120 | 90 days supply | Qty: 90 | Fill #1

## 2019-12-13 MED FILL — SYMBICORT 160-4.5 MCG INH: 160-4.5 | 30 days supply | Qty: 10 | Fill #1

## 2019-12-13 MED FILL — XARELTO 20 MG TABLET: 20 | 30 days supply | Qty: 30 | Fill #3

## 2019-12-14 DIAGNOSIS — S3210XA Unspecified fracture of sacrum, initial encounter for closed fracture: Secondary | ICD-10-CM | POA: Diagnosis not present

## 2019-12-14 DIAGNOSIS — S322XXA Fracture of coccyx, initial encounter for closed fracture: Secondary | ICD-10-CM | POA: Diagnosis not present

## 2020-01-06 ENCOUNTER — Other Ambulatory Visit: Payer: Self-pay | Admitting: Family

## 2020-01-06 MED FILL — OMEPRAZOLE 40 MG CPDR: 40 | 30 days supply | Qty: 30 | Fill #4

## 2020-01-07 MED FILL — PRAVASTATIN SODIUM 80 MG TA: 80 | 90 days supply | Qty: 90 | Fill #0

## 2020-01-15 DIAGNOSIS — J90 Pleural effusion, not elsewhere classified: Secondary | ICD-10-CM | POA: Diagnosis not present

## 2020-01-15 DIAGNOSIS — R918 Other nonspecific abnormal finding of lung field: Secondary | ICD-10-CM | POA: Diagnosis not present

## 2020-01-15 DIAGNOSIS — Z79899 Other long term (current) drug therapy: Secondary | ICD-10-CM | POA: Diagnosis not present

## 2020-01-15 DIAGNOSIS — I4891 Unspecified atrial fibrillation: Secondary | ICD-10-CM | POA: Diagnosis not present

## 2020-01-15 DIAGNOSIS — E785 Hyperlipidemia, unspecified: Secondary | ICD-10-CM | POA: Diagnosis not present

## 2020-01-15 DIAGNOSIS — E876 Hypokalemia: Secondary | ICD-10-CM | POA: Diagnosis not present

## 2020-01-15 DIAGNOSIS — Z91041 Radiographic dye allergy status: Secondary | ICD-10-CM | POA: Diagnosis not present

## 2020-01-15 DIAGNOSIS — R569 Unspecified convulsions: Secondary | ICD-10-CM | POA: Diagnosis not present

## 2020-01-17 ENCOUNTER — Other Ambulatory Visit: Payer: Self-pay

## 2020-01-19 ENCOUNTER — Inpatient Hospital Stay: Payer: Medicare Other | Admitting: Family

## 2020-01-21 ENCOUNTER — Inpatient Hospital Stay: Payer: Medicare Other | Admitting: Family

## 2020-01-21 ENCOUNTER — Telehealth: Payer: Self-pay | Admitting: Family

## 2020-01-21 NOTE — Telephone Encounter (Signed)
Lvm for patient to be aware will need to wait until after evaluation.

## 2020-01-21 NOTE — Telephone Encounter (Signed)
Robert Bates came in late for his appt that was at 1:00 and reschedule his appt on Wed 01-26-2020 but pt wanted to know if he can still go to work or wait til he is seen by provider on Wednesday for his issue [name]). Please advise.

## 2020-01-21 NOTE — Telephone Encounter (Signed)
I will need to see him before I can release him to go back to work.

## 2020-01-26 ENCOUNTER — Other Ambulatory Visit: Payer: Self-pay

## 2020-01-26 ENCOUNTER — Ambulatory Visit (INDEPENDENT_AMBULATORY_CARE_PROVIDER_SITE_OTHER): Payer: Medicare Other | Admitting: Family

## 2020-01-26 ENCOUNTER — Encounter: Payer: Self-pay | Admitting: Neurology

## 2020-01-26 ENCOUNTER — Encounter: Payer: Self-pay | Admitting: Family

## 2020-01-26 VITALS — BP 131/66 | HR 59 | Temp 97.7°F | Resp 16 | Wt 168.0 lb

## 2020-01-26 DIAGNOSIS — J42 Unspecified chronic bronchitis: Secondary | ICD-10-CM | POA: Diagnosis not present

## 2020-01-26 DIAGNOSIS — D329 Benign neoplasm of meninges, unspecified: Secondary | ICD-10-CM | POA: Diagnosis not present

## 2020-01-26 DIAGNOSIS — R569 Unspecified convulsions: Secondary | ICD-10-CM | POA: Diagnosis not present

## 2020-01-26 MED FILL — XARELTO 20 MG TABLET: 20 | 30 days supply | Qty: 30 | Fill #4

## 2020-01-26 NOTE — Progress Notes (Signed)
Subjective:    Patient ID: Robert Bates, male    DOB: 24-Nov-1940, 79 y.o.   MRN: [id number]  HPI  Patient is a 79 yr old male who presents today for hospital follow up. Pt was seen at [facility] ED on 01/15/20. Reviewed records in care everywhere. He apparently was at a gambling establishment and became light headed.  He had a seizure that was witnessed by bystanders.  He was brought to ED via EMS. He had urinary incontinence during this episode. He underwent a CT head which noted known meningioma (2.4-3.4cm).    Since returning home he notes that he has not had any seizures that he was aware of.   Review of Systems    see HPI  Past Medical History:  Diagnosis Date  . Alcoholism ([place])    past problem per pt but admits to still drinking - unclear how much  . Anxiety   . Arthritis   . 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  [name] COPD (chronic obstructive pulmonary disease) ([place])   . Diabetes type 2, controlled ([place]) 07/31/2017  . Dysrhythmia    a fib  . GERD (gastroesophageal reflux disease)   . Hematuria    refuses work up or referral - understands risks of morbidity / mortality - 11/2008, 12/2008  . History of hiatal hernia   . History of kidney stones   . Hyperlipemia   . Meningioma ([place]) 10/25/2013   Follows with Dr. [name].   . Peripheral vascular disease ([place])    Abdominal Aortic Aneursym  . Pneumonia    as a child  . Radiation 09/18/15-10/25/15   left lower lobe 70.2 Gy  . Tobacco abuse      Social History   Socioeconomic History  . Marital status: Married    Spouse name: Not on file  . Number of children: 2  . Years of education: Not on file  . Highest education level: Not on file  Occupational History  . Occupation: Retired    Fish farm manager: [employer]    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[place] Use  . Smoking status: Former Smoker    Packs/day:  1.00    Years: 57.00    Pack years: 57.00    Quit date: 08/08/2015    Years since quitting: 4.4  . Smokeless tobacco: Former Systems developer    Types: Chew    Quit date: 11/04/1958  . Tobacco comment: occasional use sneaks around  Substance and Sexual Activity  . Alcohol use: Yes    Alcohol/week: 0.0 standard drinks    Comment: Pt reports rare use hx of denies 10-01-17  . Drug use: No  . Sexual activity: Yes  Other Topics Concern  . Not on file  Social History Narrative  . Not on file   Social Determinants of Health   Financial Resource Strain:   . Difficulty of Paying Living Expenses:   Food Insecurity:   . Worried About Charity fundraiser in the Last Year:   . Arboriculturist in the Last Year:   Transportation Needs:   . Film/video editor (Medical):   [name] Lack of Transportation (Non-Medical):   Physical Activity:   . Days of Exercise per Week:   . Minutes of Exercise per Session:   Stress:   . Feeling of Stress :   Social Connections:   .  Frequency of Communication with Friends and Family:   . Frequency of Social Gatherings with Friends and Family:   . Attends Religious Services:   . Active Member of Clubs or Organizations:   . Attends Archivist Meetings:   [name] Marital Status:   Intimate Partner Violence:   . Fear of Current or Ex-Partner:   . Emotionally Abused:   [name] Physically Abused:   . Sexually Abused:     Past Surgical History:  Procedure Laterality Date  . CHOLECYSTECTOMY N/A 07/23/2017   Procedure: LAPAROSCOPIC CHOLECYSTECTOMY;  Surgeon: Kinsinger, Arta Bruce, MD;  [facility];  Service: General;  Laterality: N/A;  . COLONOSCOPY    . EYE SURGERY Bilateral    Cataracts removed w/ lens implant  . HERNIA REPAIR     Left 36 years ago . Right inguinal hernia repair 10-01-17 Dr. Kieth Brightly  . INGUINAL HERNIA REPAIR Right 10/01/2017   Procedure: RIGHT INGUINAL HERNIA REPAIR WITH MESH;  Surgeon: Kinsinger, Arta Bruce, MD;  [facility];  Service:  General;  Laterality: Right;  TAP BLOCK  . INSERTION OF MESH Right 10/01/2017   Procedure: INSERTION OF MESH;  Surgeon: Kinsinger, Arta Bruce, MD;  [facility];  Service: General;  Laterality: Right;  . TONSILLECTOMY    . TONSILLECTOMY    . VIDEO BRONCHOSCOPY Bilateral 07/26/2015   Procedure: VIDEO BRONCHOSCOPY WITH FLUORO;  Surgeon: [name], MD;  [facility];  Service: Cardiopulmonary;  Laterality: Bilateral;  . VIDEO BRONCHOSCOPY WITH ENDOBRONCHIAL NAVIGATION N/A 08/23/2015   Procedure: VIDEO BRONCHOSCOPY WITH ENDOBRONCHIAL NAVIGATION;  Surgeon: Grace Isaac, MD;  Location: [place];  Service: Thoracic;  Laterality: N/A;  . VIDEO BRONCHOSCOPY WITH ENDOBRONCHIAL ULTRASOUND N/A 08/23/2015   Procedure: VIDEO BRONCHOSCOPY WITH ENDOBRONCHIAL ULTRASOUND;  Surgeon: Grace Isaac, MD;  [facility];  Service: Thoracic;  Laterality: N/A;    Family History  Problem Relation Age of Onset  . Leukemia Father   . Emphysema Father   . Learning disabilities Son   . Leukemia Other   . Stroke Other     Allergies  Allergen Reactions  . Iodine Other (See Comments)    neck swells  . Iohexol Swelling    Neck and gland swelling per patient.    Current Outpatient Medications on File Prior to Visit  Medication Sig Dispense Refill  . albuterol (PROAIR HFA) 108 (90 Base) MCG/ACT inhaler INHALE 2 PUFFS BY MOUTH INTO THE LUNGS EVERY 6 (SIX) HOURS AS NEEDED FOR WHEEZING OR SHORTNESS OF BREATH. 8.5 g 5  . CARTIA XT 120 MG 24 hr capsule TAKE 1 CAPSULE (120 MG TOTAL) BY MOUTH DAILY. 90 capsule 2  . Fluticasone-Salmeterol ([facility] INHUB) 250-50 MCG/DOSE AEPB Inhale 1 puff into the lungs 2 (two) times daily. 60 each 5  . levETIRAcetam (KEPPRA) 750 MG tablet Take by mouth.    [name] omeprazole (PRILOSEC) 40 MG capsule TAKE 1 CAPSULE BY MOUTH DAILY. PATIENT NEEDS OFFICE VISIT FOR FURTHER REFILLS 30 capsule 5  . pravastatin (PRAVACHOL) 80 MG tablet TAKE 1 TABLET (80 MG TOTAL) BY MOUTH AT  BEDTIME. 90 tablet 1  . rivaroxaban (XARELTO) 20 MG TABS tablet Take 1 tablet (20 mg total) by mouth daily with supper. 7 tablet 0  . SYMBICORT 160-4.5 MCG/ACT inhaler INHALE 2 PUFFS INTO THE LUNGS 2 (TWO) TIMES DAILY. 10.2 g 5  . furosemide (LASIX) 20 MG tablet Take 1 tablet (20 mg total) by mouth daily as needed for edema. 30 tablet 1  No current facility-administered medications on file prior to visit.    BP 131/66 (BP Location: Right Arm, Patient Position: Sitting, Cuff Size: Small)   Pulse (!) 59   Temp 97.7 F (36.5 C) (Temporal)   Resp 16   Wt 168 lb (76.2 kg)   SpO2 100%   BMI 24.81 kg/m    Objective:   Physical Exam Constitutional:      General: He is not in acute distress.    Appearance: He is well-developed.  HENT:     Head: Normocephalic and atraumatic.  Cardiovascular:     Rate and Rhythm: Normal rate and regular rhythm.     Heart sounds: No murmur.  Pulmonary:     Effort: Pulmonary effort is normal. No respiratory distress.     Breath sounds: Examination of the right-upper field reveals wheezing. Examination of the right-middle field reveals wheezing. Wheezing present. No rales.  Skin:    General: Skin is warm and dry.  Neurological:     Mental Status: He is alert and oriented to person, place, and time.  Psychiatric:        Behavior: Behavior normal.        Thought Content: Thought content normal.           Assessment & Plan:  Seizure- new. Advised pt to continue Keppra (will check follow up level today). We also discussed that he should not drive until he is 6 months seizure free per [place] Driving laws.  (Advised he cannot drive any trucks or cars).  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  Will refer to neurology for further evaluation.  COPD- reports chronic wheezing. Will refer to pulmonology for further evaluation.   Meningioma- obtain MRI of brain to further evaluate.

## 2020-01-27 ENCOUNTER — Telehealth: Payer: Self-pay | Admitting: Family

## 2020-01-27 DIAGNOSIS — R569 Unspecified convulsions: Secondary | ICD-10-CM

## 2020-01-27 NOTE — Telephone Encounter (Signed)
Pt wants to know if he can be referred a different Neuro. States that [facility] told him they coulndt get him in until June 30th. PT wants to have appt sooner.

## 2020-01-28 ENCOUNTER — Ambulatory Visit: Payer: Medicare Other | Attending: Internal Medicine

## 2020-01-28 DIAGNOSIS — Z23 Encounter for immunization: Secondary | ICD-10-CM

## 2020-01-28 NOTE — Telephone Encounter (Signed)
I placed a referral to [facility]- we can see if they can get [place] in sooner.

## 2020-01-28 NOTE — Progress Notes (Signed)
   Covid-19 Vaccination Clinic  Name:  Robert Bates    MRN: [id number] DOB: [birth year]  01/28/2020  [name] was observed post Covid-19 immunization for 15 minutes without incident. He was provided with Vaccine Information Sheet and instruction to access the V-Safe system.   [name] was instructed to call 911 with any severe reactions post vaccine: [name] Difficulty breathing  . Swelling of face and throat  . A fast heartbeat  . A bad rash all over body  . Dizziness and weakness   Immunizations Administered    Name Date Dose VIS Date Route   Pfizer COVID-19 Vaccine 01/28/2020  8:22 AM 0.3 mL 10/15/2019 Intramuscular   Manufacturer: [place]   Lot: [id number]   [place]: [address]-0

## 2020-01-28 NOTE — Telephone Encounter (Signed)
Patient advised of new referral and phone number provided

## 2020-01-29 LAB — LEVETIRACETAM LEVEL: Keppra (Levetiracetam): 16.5 ug/mL (ref 12.0–46.0)

## 2020-01-31 ENCOUNTER — Ambulatory Visit: Payer: Medicare Other | Admitting: Pulmonary Disease

## 2020-02-18 ENCOUNTER — Encounter: Payer: Self-pay | Admitting: Neurology

## 2020-02-18 ENCOUNTER — Other Ambulatory Visit: Payer: Self-pay

## 2020-02-18 ENCOUNTER — Ambulatory Visit (INDEPENDENT_AMBULATORY_CARE_PROVIDER_SITE_OTHER): Payer: Medicare Other | Admitting: Neurology

## 2020-02-18 VITALS — BP 145/83 | HR 63 | Temp 97.6°F | Ht 69.0 in | Wt 168.0 lb

## 2020-02-18 DIAGNOSIS — D329 Benign neoplasm of meninges, unspecified: Secondary | ICD-10-CM | POA: Diagnosis not present

## 2020-02-18 DIAGNOSIS — R569 Unspecified convulsions: Secondary | ICD-10-CM | POA: Diagnosis not present

## 2020-02-18 DIAGNOSIS — G40909 Epilepsy, unspecified, not intractable, without status epilepticus: Secondary | ICD-10-CM | POA: Insufficient documentation

## 2020-02-18 HISTORY — DX: Unspecified convulsions: R56.9

## 2020-02-18 MED ORDER — LEVETIRACETAM 500 MG PO TABS: 500.0000 mg | ORAL_TABLET | Freq: Two times a day (BID) | ORAL | 1 refills | Status: DC

## 2020-02-18 MED FILL — OMEPRAZOLE 40 MG CPDR: 40 | 30 days supply | Qty: 30 | Fill #5

## 2020-02-18 MED FILL — levETIRAcetam 500 MG TABS: 500 | 30 days supply | Qty: 60 | Fill #0

## 2020-02-18 NOTE — Progress Notes (Signed)
Reason for visit: Probable seizure  Referring physician: ER  Robert Bates is a 79 y.o. male  History of present illness:   Robert Bates is a 79 year old right-handed white male with a history of 2 separate meningioma masses in the head, the largest in the right frontal area.  There is some mass-effect on the cortex of the brain.  The patient works as a Administrator, on 15 January 7672 he was at an establishment gambling.  He indicates that he only had a cup of coffee that day, he had not eaten.  He had urinary urgency and sensation of wanting to have a bowel movement but he delayed this.  He is amnestic for the seizure-like event, he remembers waking up with EMS present.  The patient apparently had witnessed jerking of the extremities.  The patient did have urinary incontinence, he did not have bowel incontinence or tongue biting.  He has never had a similar event previously.  He had a mild headache following the episode, but generally he does not have headaches.  He has not had a lot of muscle soreness following the event.  The patient denies any focal numbness or weakness of the face, arms, legs.  He denies any vision problems or speech or swallowing problems.  He denies issues controlling the bowels or the bladder.  He claims he does not drink alcohol and does not use illicit drugs.  The patient has no prior history of a concussion or closed head injury and there is no family history of seizures.  The patient was placed on Keppra and he will was referred to this office.  His potassium level was 3.0 initially in the ER.   ER note from 01/15/20:  Robert Bates is a 79 y.o. male here for complaint of seizure. Patient does not have history of seizures. Patient was at a gambling establishment. Patient states he had been sitting where he got lightheaded and then next thing he knows EMS was there. He did have urinary incontinence. Bystanders told EMS that he was shaking. Patient does not recall the event.  He complains of headache at this time, states he did not initially have a headache this was just after the seizure. Does not believe he is hit his head but unsure. No chest pain pressure heaviness or tightness coughing or shortness of breath neck pain or back pain numbness tingling motor weakness visual or speech changes. Patient has a history of current lung cancer being [facility] at [facility]   Past Medical History:  Diagnosis Date  . Alcoholism ([place])    past problem per pt but admits to still drinking - unclear how much  . Anxiety   . Arthritis   . 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  [name] COPD (chronic obstructive pulmonary disease) ([place])   . Diabetes type 2, controlled ([place]) 07/31/2017  . Dysrhythmia    a fib  . GERD (gastroesophageal reflux disease)   . Hematuria    refuses work up or referral - understands risks of morbidity / mortality - 11/2008, 12/2008  . History of hiatal hernia   . History of kidney stones   . Hyperlipemia   . Meningioma ([place]) 10/25/2013   Follows with Dr. [name].   . Peripheral vascular disease ([place])    Abdominal Aortic Aneursym  . Pneumonia    as a child  .  Radiation 09/18/15-10/25/15   left lower lobe 70.2 Gy  . Seizures ([place]) 02/18/2020  . Tobacco abuse     Past Surgical History:  Procedure Laterality Date  . CHOLECYSTECTOMY N/A 07/23/2017   Procedure: LAPAROSCOPIC CHOLECYSTECTOMY;  Surgeon: Kinsinger, Arta Bruce, MD;  [facility];  Service: General;  Laterality: N/A;  . COLONOSCOPY    . EYE SURGERY Bilateral    Cataracts removed w/ lens implant  . HERNIA REPAIR     Left 36 years ago . Right inguinal hernia repair 10-01-17 Dr. Kieth Brightly  . INGUINAL HERNIA REPAIR Right 10/01/2017   Procedure: RIGHT INGUINAL HERNIA REPAIR WITH MESH;  Surgeon: Kinsinger, Arta Bruce, MD;  [facility];  Service: General;  Laterality: Right;   TAP BLOCK  . INSERTION OF MESH Right 10/01/2017   Procedure: INSERTION OF MESH;  Surgeon: Kinsinger, Arta Bruce, MD;  [facility];  Service: General;  Laterality: Right;  . TONSILLECTOMY    . TONSILLECTOMY    . VIDEO BRONCHOSCOPY Bilateral 07/26/2015   Procedure: VIDEO BRONCHOSCOPY WITH FLUORO;  Surgeon: [name], MD;  [facility];  Service: Cardiopulmonary;  Laterality: Bilateral;  . VIDEO BRONCHOSCOPY WITH ENDOBRONCHIAL NAVIGATION N/A 08/23/2015   Procedure: VIDEO BRONCHOSCOPY WITH ENDOBRONCHIAL NAVIGATION;  Surgeon: Grace Isaac, MD;  Location: [place];  Service: Thoracic;  Laterality: N/A;  . VIDEO BRONCHOSCOPY WITH ENDOBRONCHIAL ULTRASOUND N/A 08/23/2015   Procedure: VIDEO BRONCHOSCOPY WITH ENDOBRONCHIAL ULTRASOUND;  Surgeon: Grace Isaac, MD;  [facility];  Service: Thoracic;  Laterality: N/A;    Family History  Problem Relation Age of Onset  . Leukemia Father   . Emphysema Father   . Learning disabilities Son   . Leukemia Other   . Stroke Other     Social history:  reports that he quit smoking about 4 years ago. He has a 57.00 pack-year smoking history. He quit smokeless tobacco use about 61 years ago.  His smokeless tobacco use included chew. He reports current alcohol use. He reports that he does not use drugs.  Medications:  Prior to Admission medications   Medication Sig Start Date End Date Taking? Authorizing Provider  albuterol (PROAIR HFA) 108 (90 Base) MCG/ACT inhaler INHALE 2 PUFFS BY MOUTH INTO THE LUNGS EVERY 6 (SIX) HOURS AS NEEDED FOR WHEEZING OR SHORTNESS OF BREATH. 11/20/18   [name], NP  CARTIA XT 120 MG 24 hr capsule TAKE 1 CAPSULE (120 MG TOTAL) BY MOUTH DAILY. 08/27/19   Nahser, Wonda Cheng, MD  Fluticasone-Salmeterol [facility] INHUB) 250-50 MCG/DOSE AEPB Inhale 1 puff into the lungs 2 (two) times daily. 03/30/19   [name], NP  furosemide (LASIX) 20 MG tablet Take 1 tablet (20 mg total) by mouth daily as needed  for edema. 07/08/18 10/06/18  Bhagat, Crista Luria, PA  levETIRAcetam (KEPPRA) 750 MG tablet Take by mouth. 01/15/20   [provider]  omeprazole (PRILOSEC) 40 MG capsule TAKE 1 CAPSULE BY MOUTH DAILY. PATIENT NEEDS OFFICE VISIT FOR FURTHER REFILLS 07/23/19   [name], NP  pravastatin (PRAVACHOL) 80 MG tablet TAKE 1 TABLET (80 MG TOTAL) BY MOUTH AT BEDTIME. 01/07/20   [name], NP  rivaroxaban (XARELTO) 20 MG TABS tablet Take 1 tablet (20 mg total) by mouth daily with supper. 10/22/19   [name], NP  SYMBICORT 160-4.5 MCG/ACT inhaler INHALE 2 PUFFS INTO THE LUNGS 2 (TWO) TIMES DAILY. 05/07/19   [name], NP      Allergies  Allergen Reactions  . Iodine Other (See Comments)  neck swells  . Iohexol Swelling    Neck and gland swelling per patient.    ROS:  Out of a complete 14 system review of symptoms, the patient complains only of the following symptoms, and all other reviewed systems are negative.  Seizure type event  Blood pressure (!) 145/83, pulse 63, temperature 97.6 F (36.4 C), height 5\' 9"  (1.753 m), weight 168 lb (76.2 kg).  Physical Exam  General: The patient is alert and cooperative at the time of the examination.  Eyes: Pupils are equal, round, and reactive to light. Discs are flat bilaterally.  Neck: The neck is supple, no carotid bruits are noted.  Respiratory: The respiratory examination is clear.  Cardiovascular: The cardiovascular examination reveals a regular rate and rhythm, no obvious murmurs or rubs are noted.  Skin: Extremities are with 2+ edema below the knees bilaterally.  Neurologic Exam  Mental status: The patient is alert and oriented x 3 at the time of the examination. The patient has apparent normal recent and remote memory, with an apparently normal attention span and concentration ability.  Cranial nerves: Facial symmetry is present. There is good sensation of the face to pinprick and soft touch  bilaterally. The strength of the facial muscles and the muscles to head turning and shoulder shrug are normal bilaterally. Speech is well enunciated, no aphasia or dysarthria is noted. Extraocular movements are full. Visual fields are full. The tongue is midline, and the patient has symmetric elevation of the soft palate. No obvious hearing deficits are noted.  Motor: The motor testing reveals 5 over 5 strength of all 4 extremities. Good symmetric motor tone is noted throughout.  Sensory: Sensory testing is intact to pinprick, soft touch, vibration sensation, and position sense on all 4 extremities with exception of impairment of vibration sensation in both feet, position sense is relatively intact. No evidence of extinction is noted.  Coordination: Cerebellar testing reveals good finger-nose-finger and heel-to-shin bilaterally.  Gait and station: Gait is normal. Tandem gait is normal. Romberg is negative. No drift is seen.  Reflexes: Deep tendon reflexes are symmetric, may be slightly brisk bilaterally, but the ankle jerk reflexes are depressed. Toes are downgoing bilaterally.   CT head and cervical 10/30/19:  IMPRESSION: No acute intracranial abnormalities.  Two stable extra-axial masses, located at anterior skull base and RIGHT vertex as above consistent with meningiomas.  Degenerative disc and facet disease changes of the cervical spine.  No acute cervical spine abnormalities.   CT scan images were reviewed online. I agree with the written report.    Assessment/Plan:  1.  Seizure type event  2.  Meningioma  3.  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  He likely will be unable to return to his prior line of employment.  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  He may be able to do this after a  12-month waiting period following the seizure.  The patient should apply for short-term disability at this time.  He is not to operate any motor vehicle for 6 months following the seizure type event.  We will check an EEG study.  We will check MRI of the brain with and without contrast.  He ran out of his Keppra 1 week ago, I have sent in another prescription for him to start taking 500 mg  twice daily.  Jill Alexanders MD 02/18/2020 9:13 AM  [facility] [address] [place], [place] [address]  Phone [phone] Fax [phone]

## 2020-02-21 ENCOUNTER — Telehealth: Payer: Self-pay | Admitting: Neurology

## 2020-02-21 ENCOUNTER — Ambulatory Visit: Payer: Medicare Other | Attending: Internal Medicine

## 2020-02-21 DIAGNOSIS — Z23 Encounter for immunization: Secondary | ICD-10-CM

## 2020-02-21 NOTE — Telephone Encounter (Signed)
[employer] medicare order sent to GI. No auth they will reach out to the patient to schedule.  

## 2020-02-21 NOTE — Progress Notes (Signed)
   Covid-19 Vaccination Clinic  Name:  Tysean Vandervliet    MRN: [id number] DOB: [birth year]  02/21/2020  Mr. Hoar was observed post Covid-19 immunization for 15 minutes without incident. He was provided with Vaccine Information Sheet and instruction to access the V-Safe system.   Mr. Galdamez was instructed to call 911 with any severe reactions post vaccine: [name] Difficulty breathing  . Swelling of face and throat  . A fast heartbeat  . A bad rash all over body  . Dizziness and weakness   Immunizations Administered    Name Date Dose VIS Date Route   Pfizer COVID-19 Vaccine 02/21/2020  1:45 PM 0.3 mL 12/29/2018 Intramuscular   Manufacturer: [place]   Lot: [id number]   [place]: [address]-8

## 2020-02-25 ENCOUNTER — Ambulatory Visit: Payer: Medicare Other | Admitting: Family

## 2020-03-08 ENCOUNTER — Ambulatory Visit (INDEPENDENT_AMBULATORY_CARE_PROVIDER_SITE_OTHER): Payer: Medicare Other | Admitting: Neurology

## 2020-03-08 ENCOUNTER — Other Ambulatory Visit: Payer: Self-pay

## 2020-03-08 ENCOUNTER — Encounter: Payer: Self-pay | Admitting: Family

## 2020-03-08 ENCOUNTER — Telehealth: Payer: Self-pay | Admitting: Family

## 2020-03-08 ENCOUNTER — Ambulatory Visit (INDEPENDENT_AMBULATORY_CARE_PROVIDER_SITE_OTHER): Payer: Medicare Other | Admitting: Family

## 2020-03-08 VITALS — BP 132/76 | HR 70 | Temp 97.4°F | Resp 16 | Wt 167.0 lb

## 2020-03-08 DIAGNOSIS — E119 Type 2 diabetes mellitus without complications: Secondary | ICD-10-CM

## 2020-03-08 DIAGNOSIS — K219 Gastro-esophageal reflux disease without esophagitis: Secondary | ICD-10-CM

## 2020-03-08 DIAGNOSIS — J449 Chronic obstructive pulmonary disease, unspecified: Secondary | ICD-10-CM

## 2020-03-08 DIAGNOSIS — R569 Unspecified convulsions: Secondary | ICD-10-CM

## 2020-03-08 DIAGNOSIS — N401 Enlarged prostate with lower urinary tract symptoms: Secondary | ICD-10-CM | POA: Diagnosis not present

## 2020-03-08 DIAGNOSIS — R351 Nocturia: Secondary | ICD-10-CM | POA: Diagnosis not present

## 2020-03-08 DIAGNOSIS — I480 Paroxysmal atrial fibrillation: Secondary | ICD-10-CM

## 2020-03-08 DIAGNOSIS — E785 Hyperlipidemia, unspecified: Secondary | ICD-10-CM

## 2020-03-08 MED ORDER — TAMSULOSIN HCL 0.4 MG PO CAPS: 0.4000 mg | ORAL_CAPSULE | Freq: Every day | ORAL | 3 refills | Status: DC

## 2020-03-08 MED FILL — TAMSULOSIN HCL 0.4 MG CAP: 0.4 | 30 days supply | Qty: 30 | Fill #0

## 2020-03-08 NOTE — Addendum Note (Signed)
Addended by: Jiles Prows on: 03/08/2020 04:00 PM   Modules accepted: Orders

## 2020-03-08 NOTE — Patient Instructions (Addendum)
Please begin flomax once daily to help with your nighttime urination. For your breathing - please start advair twice daily even if you are feeling good. You may use albuterol as needed for wheezing. No driving any vehicles until 6 months seizure free per [place] Driving Laws. Complete lab work prior to leaving.

## 2020-03-08 NOTE — Progress Notes (Signed)
  Chronic Care Management   Outreach Note  03/08/2020 Name: Robert Bates MRN: [id number] DOB: May 18, 1941  Referred by: [name], NP Reason for referral : No chief complaint on file.   An unsuccessful telephone outreach was attempted today. The patient was referred to the pharmacist for assistance with care management and care coordination.    This note is not being shared with the patient for the following reason: To respect privacy (The patient or proxy has requested that the information not be shared).  Follow Up Plan:   Raynicia Dukes [employer] Scheduler

## 2020-03-08 NOTE — Telephone Encounter (Signed)
Patient would like to schedule an appointment with you please.  He is interested in the pill packs from the pharmacy.

## 2020-03-08 NOTE — Progress Notes (Signed)
Subjective:    Patient ID: Robert Bates, male    DOB: Aug 24, 1941, 79 y.o.   MRN: [id number]  HPI  Patient is a 79 yr old male who presents today for follow-up.  Last visit we saw him following a new onset seizure.  He was continued on Keppra and referred to neurology for further evaluation.  The patient saw Dr. Floyde Parkins from neurology for consultation on February 18, 2020.  He ordered an MRI of the brain with and without contrast.  He was advised by neurology to continue [place].  He was also advised not to drive per [place] laws. He denies any further seizures.    COPD- maintained on symbicort and albuterol.  Hyperlipidemia- patient is maintained on pravastatin.  Lab Results  Component Value Date   CHOL 161 11/20/2018   HDL 52.50 11/20/2018   LDLCALC 74 11/20/2018   LDLDIRECT 142.2 12/26/2008   TRIG 175.0 (H) 11/20/2018   CHOLHDL 3 11/20/2018   GERD- reports symptoms stable on omeprazole.   AF- maintained on xarelto and cartia xl.    Nocturia- x 2-3. Finds this bothersome.   Lab Results  Component Value Date   PSA 2.48 07/30/2017   PSA 1.88 11/17/2009   PSA 2.02 12/26/2008    Review of Systems See HPI  Past Medical History:  Diagnosis Date  . Alcoholism ([place])    past problem per pt but admits to still drinking - unclear how much  . Anxiety   . Arthritis   . 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  [name] COPD (chronic obstructive pulmonary disease) ([place])   . Diabetes type 2, controlled ([place]) 07/31/2017  . Dysrhythmia    a fib  . GERD (gastroesophageal reflux disease)   . Hematuria    refuses work up or referral - understands risks of morbidity / mortality - 11/2008, 12/2008  . History of hiatal hernia   . History of kidney stones   . Hyperlipemia   . Meningioma ([place]) 10/25/2013   Follows with Dr. [name].   . Peripheral vascular disease ([place])    Abdominal Aortic Aneursym  .  Pneumonia    as a child  . Radiation 09/18/15-10/25/15   left lower lobe 70.2 Gy  . Seizures ([place]) 02/18/2020  . Tobacco abuse      Social History   Socioeconomic History  . Marital status: Married    Spouse name: Not on file  . Number of children: 2  . Years of education: Not on file  . Highest education level: Not on file  Occupational History  . Occupation: Retired    Fish farm manager: [employer]    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[place] Use  . Smoking status: Former Smoker    Packs/day: 1.00    Years: 57.00    Pack years: 57.00    Quit date: 08/08/2015    Years since quitting: 4.5  . Smokeless tobacco: Former Systems developer    Types: Chew    Quit date: 11/04/1958  . Tobacco comment: occasional use sneaks around  Substance and Sexual Activity  . Alcohol use: Yes    Alcohol/week: 0.0 standard drinks    Comment: Pt reports rare use hx of denies 10-01-17  . Drug use: No  . Sexual activity: Yes  Other Topics Concern  . Not on file  Social History Narrative  . Not on file  Social Determinants of Health   Financial Resource Strain:   . Difficulty of Paying Living Expenses:   Food Insecurity:   . Worried About Charity fundraiser in the Last Year:   . Arboriculturist in the Last Year:   Transportation Needs:   . Film/video editor (Medical):   [name] Lack of Transportation (Non-Medical):   Physical Activity:   . Days of Exercise per Week:   . Minutes of Exercise per Session:   Stress:   . Feeling of Stress :   Social Connections:   . Frequency of Communication with Friends and Family:   . Frequency of Social Gatherings with Friends and Family:   . Attends Religious Services:   . Active Member of Clubs or Organizations:   . Attends Archivist Meetings:   [name] Marital Status:   Intimate Partner Violence:   . Fear of Current or Ex-Partner:   . Emotionally Abused:   [name] Physically Abused:   . Sexually Abused:     Past Surgical History:  Procedure  Laterality Date  . CHOLECYSTECTOMY N/A 07/23/2017   Procedure: LAPAROSCOPIC CHOLECYSTECTOMY;  Surgeon: Kinsinger, Arta Bruce, MD;  [facility];  Service: General;  Laterality: N/A;  . COLONOSCOPY    . EYE SURGERY Bilateral    Cataracts removed w/ lens implant  . HERNIA REPAIR     Left 36 years ago . Right inguinal hernia repair 10-01-17 Dr. Kieth Brightly  . INGUINAL HERNIA REPAIR Right 10/01/2017   Procedure: RIGHT INGUINAL HERNIA REPAIR WITH MESH;  Surgeon: Kinsinger, Arta Bruce, MD;  [facility];  Service: General;  Laterality: Right;  TAP BLOCK  . INSERTION OF MESH Right 10/01/2017   Procedure: INSERTION OF MESH;  Surgeon: Kinsinger, Arta Bruce, MD;  [facility];  Service: General;  Laterality: Right;  . TONSILLECTOMY    . TONSILLECTOMY    . VIDEO BRONCHOSCOPY Bilateral 07/26/2015   Procedure: VIDEO BRONCHOSCOPY WITH FLUORO;  Surgeon: [name], MD;  [facility];  Service: Cardiopulmonary;  Laterality: Bilateral;  . VIDEO BRONCHOSCOPY WITH ENDOBRONCHIAL NAVIGATION N/A 08/23/2015   Procedure: VIDEO BRONCHOSCOPY WITH ENDOBRONCHIAL NAVIGATION;  Surgeon: Grace Isaac, MD;  Location: [facility];  Service: Thoracic;  Laterality: N/A;  . VIDEO BRONCHOSCOPY WITH ENDOBRONCHIAL ULTRASOUND N/A 08/23/2015   Procedure: VIDEO BRONCHOSCOPY WITH ENDOBRONCHIAL ULTRASOUND;  Surgeon: Grace Isaac, MD;  [facility];  Service: Thoracic;  Laterality: N/A;    Family History  Problem Relation Age of Onset  . Leukemia Father   . Emphysema Father   . Learning disabilities Son   . Leukemia Other   . Stroke Other     Allergies  Allergen Reactions  . Iodine Other (See Comments)    neck swells  . Iohexol Swelling    Neck and gland swelling per patient.    Current Outpatient Medications on File Prior to Visit  Medication Sig Dispense Refill  . albuterol (PROAIR HFA) 108 (90 Base) MCG/ACT inhaler INHALE 2 PUFFS BY MOUTH INTO THE LUNGS EVERY 6 (SIX) HOURS AS NEEDED FOR  WHEEZING OR SHORTNESS OF BREATH. 8.5 g 5  . CARTIA XT 120 MG 24 hr capsule TAKE 1 CAPSULE (120 MG TOTAL) BY MOUTH DAILY. 90 capsule 2  . Fluticasone-Salmeterol ([place] INHUB) 250-50 MCG/DOSE AEPB Inhale 1 puff into the lungs 2 (two) times daily. 60 each 5  . levETIRAcetam (KEPPRA) 500 MG tablet Take 1 tablet (500 mg total) by mouth 2 (two) times daily. 180 tablet  1  . omeprazole (PRILOSEC) 40 MG capsule TAKE 1 CAPSULE BY MOUTH DAILY. PATIENT NEEDS OFFICE VISIT FOR FURTHER REFILLS 30 capsule 5  . pravastatin (PRAVACHOL) 80 MG tablet TAKE 1 TABLET (80 MG TOTAL) BY MOUTH AT BEDTIME. 90 tablet 1  . rivaroxaban (XARELTO) 20 MG TABS tablet Take 1 tablet (20 mg total) by mouth daily with supper. 7 tablet 0  . SYMBICORT 160-4.5 MCG/ACT inhaler INHALE 2 PUFFS INTO THE LUNGS 2 (TWO) TIMES DAILY. 10.2 g 5  . furosemide (LASIX) 20 MG tablet Take 1 tablet (20 mg total) by mouth daily as needed for edema. 30 tablet 1   No current facility-administered medications on file prior to visit.    BP 132/76 (BP Location: Right Arm, Patient Position: Sitting, Cuff Size: Small)   Pulse 70   Temp (!) 97.4 F (36.3 C) (Temporal)   Resp 16   Wt 167 lb (75.8 kg)   SpO2 99%   BMI 24.66 kg/m       Objective:   Physical Exam Constitutional:      General: He is not in acute distress.    Appearance: He is well-developed.  HENT:     Head: Normocephalic and atraumatic.  Cardiovascular:     Rate and Rhythm: Normal rate and regular rhythm.     Heart sounds: No murmur.  Pulmonary:     Effort: Pulmonary effort is normal. No respiratory distress.     Breath sounds: Normal breath sounds. No wheezing or rales.  Skin:    General: Skin is warm and dry.  Neurological:     Mental Status: He is alert and oriented to person, place, and time.  Psychiatric:        Behavior: Behavior normal.        Thought Content: Thought content normal.           Assessment & Plan:  BPH- new. Will obtain follow up psa and give  trial of flomax.  GERD- stable on PPI, continue same.  Atrial fibrillation- rate stable, on xarelto. Management per cardiology.  Meningioma- scheduled for MRI with neurology today.  Seizure- maintained on keppra. Reminded pt no driving until 6 months seizure free per [place] driving laws.  Hyperlipidemia- tolerating statin. Obtain follow up lipid panel.  DM2- obtain follow up A1C.   Lab Results  Component Value Date   HGBA1C 6.2 11/20/2018   COPD- not using symbicort regularly. Advair is currently preferred. Will start advair instead.  Continue albuterol PRN. He states that he is no longer smoking but he is vaping. I advised him to stop vaping as well.   This visit occurred during the SARS-CoV-2 public health emergency.  Safety protocols were in place, including screening questions prior to the visit, additional usage of staff PPE, and extensive cleaning of exam room while observing appropriate contact time as indicated for disinfecting solutions.

## 2020-03-08 NOTE — Telephone Encounter (Signed)
Thank you for this! I have passed this along to my scheduler. She will work to get him on my schedule. Please let me know if I can assist with anything further!

## 2020-03-09 ENCOUNTER — Other Ambulatory Visit (INDEPENDENT_AMBULATORY_CARE_PROVIDER_SITE_OTHER): Payer: Medicare Other

## 2020-03-09 ENCOUNTER — Other Ambulatory Visit: Payer: Self-pay

## 2020-03-09 DIAGNOSIS — E785 Hyperlipidemia, unspecified: Secondary | ICD-10-CM | POA: Diagnosis not present

## 2020-03-09 DIAGNOSIS — R351 Nocturia: Secondary | ICD-10-CM | POA: Diagnosis not present

## 2020-03-09 DIAGNOSIS — N401 Enlarged prostate with lower urinary tract symptoms: Secondary | ICD-10-CM | POA: Diagnosis not present

## 2020-03-09 DIAGNOSIS — E119 Type 2 diabetes mellitus without complications: Secondary | ICD-10-CM | POA: Diagnosis not present

## 2020-03-10 LAB — PSA: PSA: 1.61 ng/mL (ref 0.10–4.00)

## 2020-03-10 LAB — LIPID PANEL
Cholesterol: 157 mg/dL (ref 0–200)
HDL: 50.1 mg/dL (ref >39.00)
LDL Cholesterol: 76 mg/dL (ref 0–99)
NonHDL: 106.42
Total CHOL/HDL Ratio: 3
Triglycerides: 154 mg/dL — ABNORMAL HIGH (ref 0.0–149.0)
VLDL: 30.8 mg/dL (ref 0.0–40.0)

## 2020-03-10 LAB — HEMOGLOBIN A1C: Hgb A1c MFr Bld: 5.9 % (ref 4.6–6.5)

## 2020-03-13 ENCOUNTER — Telehealth: Payer: Self-pay | Admitting: Neurology

## 2020-03-13 ENCOUNTER — Telehealth: Payer: Self-pay | Admitting: Family

## 2020-03-13 NOTE — Procedures (Signed)
    History:  Robert Bates is a 79 year old gentleman with a history of a right frontal meningioma who sustained a witnessed seizure event on 15 January 2020.  The patient is being evaluated for the seizure.  This is a routine EEG.  No skull defects are noted.  Medications include albuterol, Cartia XT, fluticasone, Keppra, Prilosec, Pravachol, Xarelto, and Symbicort.  EEG classification: Normal awake  Description of the recording: The background rhythms of this recording consists of a fairly well modulated medium amplitude alpha rhythm of 10 Hz that is reactive to eye opening and closure. As the record progresses, the patient appears to remain in the waking state throughout the recording. Photic stimulation was performed, resulting in a bilateral and symmetric photic driving response. Hyperventilation was also performed, resulting in a minimal buildup of the background rhythm activities without significant slowing seen. At no time during the recording does there appear to be evidence of spike or spike wave discharges or evidence of focal slowing. EKG monitor shows no evidence of cardiac rhythm abnormalities with a heart rate of 60.  Impression: This is a normal EEG recording in the waking state. No evidence of ictal or interictal discharges are seen.

## 2020-03-13 NOTE — Telephone Encounter (Signed)
I called the patient.  EEG study was unremarkable, he is to remain on the [place].  I have recommended short-term disability for him until he can drive again.  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

## 2020-03-13 NOTE — Telephone Encounter (Signed)
error 

## 2020-03-13 NOTE — Telephone Encounter (Signed)
Pt called and LVM wanting to know if his EEG results are in. Please advise.

## 2020-03-24 ENCOUNTER — Other Ambulatory Visit: Payer: Self-pay | Admitting: Family

## 2020-03-24 MED FILL — OMEPRAZOLE 40 MG CPDR: 40 | 30 days supply | Qty: 30 | Fill #0

## 2020-03-24 MED FILL — XARELTO 20 MG TABLET: 20 | 30 days supply | Qty: 30 | Fill #5

## 2020-04-02 ENCOUNTER — Encounter (HOSPITAL_BASED_OUTPATIENT_CLINIC_OR_DEPARTMENT_OTHER): Payer: Self-pay | Admitting: *Deleted

## 2020-04-02 ENCOUNTER — Emergency Department (HOSPITAL_BASED_OUTPATIENT_CLINIC_OR_DEPARTMENT_OTHER)
Admission: EM | Admit: 2020-04-02 | Discharge: 2020-04-02 | Disposition: A | Discharge status: Home / Self Care | Payer: Medicare Other | Attending: Emergency Medicine | Admitting: Emergency Medicine

## 2020-04-02 ENCOUNTER — Emergency Department (HOSPITAL_BASED_OUTPATIENT_CLINIC_OR_DEPARTMENT_OTHER): Payer: Medicare Other

## 2020-04-02 ENCOUNTER — Other Ambulatory Visit: Payer: Self-pay

## 2020-04-02 DIAGNOSIS — Z7902 Long term (current) use of antithrombotics/antiplatelets: Secondary | ICD-10-CM | POA: Insufficient documentation

## 2020-04-02 DIAGNOSIS — E119 Type 2 diabetes mellitus without complications: Secondary | ICD-10-CM | POA: Diagnosis not present

## 2020-04-02 DIAGNOSIS — Z9114 Patient's other noncompliance with medication regimen: Secondary | ICD-10-CM | POA: Diagnosis not present

## 2020-04-02 DIAGNOSIS — I480 Paroxysmal atrial fibrillation: Secondary | ICD-10-CM | POA: Insufficient documentation

## 2020-04-02 DIAGNOSIS — E785 Hyperlipidemia, unspecified: Secondary | ICD-10-CM | POA: Diagnosis not present

## 2020-04-02 DIAGNOSIS — Z87891 Personal history of nicotine dependence: Secondary | ICD-10-CM | POA: Diagnosis not present

## 2020-04-02 DIAGNOSIS — M79605 Pain in left leg: Secondary | ICD-10-CM | POA: Diagnosis not present

## 2020-04-02 DIAGNOSIS — G40909 Epilepsy, unspecified, not intractable, without status epilepticus: Secondary | ICD-10-CM | POA: Diagnosis not present

## 2020-04-02 DIAGNOSIS — Z79899 Other long term (current) drug therapy: Secondary | ICD-10-CM | POA: Insufficient documentation

## 2020-04-02 DIAGNOSIS — J449 Chronic obstructive pulmonary disease, unspecified: Secondary | ICD-10-CM | POA: Insufficient documentation

## 2020-04-02 DIAGNOSIS — I4891 Unspecified atrial fibrillation: Secondary | ICD-10-CM | POA: Diagnosis not present

## 2020-04-02 LAB — CBC WITH DIFFERENTIAL/PLATELET
Abs Immature Granulocytes: 0.02 10*3/uL (ref 0.00–0.07)
Basophils Absolute: 0 10*3/uL (ref 0.0–0.1)
Basophils Relative: 0 %
Eosinophils Absolute: 0.1 10*3/uL (ref 0.0–0.5)
Eosinophils Relative: 1 %
HCT: 43.1 % (ref 39.0–52.0)
Hemoglobin: 14.2 g/dL (ref 13.0–17.0)
Immature Granulocytes: 0 %
Lymphocytes Relative: 16 %
Lymphs Abs: 1.1 10*3/uL (ref 0.7–4.0)
MCH: 30.3 pg (ref 26.0–34.0)
MCHC: 32.9 g/dL (ref 30.0–36.0)
MCV: 92.1 fL (ref 80.0–100.0)
Monocytes Absolute: 0.6 10*3/uL (ref 0.1–1.0)
Monocytes Relative: 9 %
Neutro Abs: 5.1 10*3/uL (ref 1.7–7.7)
Neutrophils Relative %: 74 %
Platelets: 216 10*3/uL (ref 150–400)
RBC: 4.68 MIL/uL (ref 4.22–5.81)
RDW: 13.2 % (ref 11.5–15.5)
WBC: 6.9 10*3/uL (ref 4.0–10.5)
nRBC: 0 % (ref 0.0–0.2)

## 2020-04-02 LAB — BASIC METABOLIC PANEL
Anion gap: 8 (ref 5–15)
BUN: 15 mg/dL (ref 8–23)
CO2: 27 mmol/L (ref 22–32)
Calcium: 9.1 mg/dL (ref 8.9–10.3)
Chloride: 105 mmol/L (ref 98–111)
Creatinine, Ser: 0.98 mg/dL (ref 0.61–1.24)
GFR calc Af Amer: >60 mL/min (ref >60)
GFR calc non Af Amer: >60 mL/min (ref >60)
Glucose, Bld: 109 mg/dL — ABNORMAL HIGH (ref 70–99)
Potassium: 4.3 mmol/L (ref 3.5–5.1)
Sodium: 140 mmol/L (ref 135–145)

## 2020-04-02 NOTE — Discharge Instructions (Signed)
There were no blood clots found in your leg today on your ultrasound.  We STRONGLY encouraged you to keep taking your xarelto, your blood thinner.  This is extremely important to prevent you from developing blood clots or strokes in the future.

## 2020-04-02 NOTE — ED Provider Notes (Signed)
[facility] EMERGENCY DEPARTMENT Provider Note   CSN: [id number] Arrival date & time: 04/02/20  1327     History Chief Complaint  Patient presents with  . Leg Pain    Robert Bates is a 79 y.o. male.  Patient presents with left posterior calf and medial thigh pain for the past 3 days.  Patient is on blood thinners for A. fib however has not been compliant with taking.  Patient denies any known blood clot history.  No fevers or chills, no shortness of breath or chest pain.  No injuries.        Past Medical History:  Diagnosis Date  . Alcoholism ([place])    past problem per pt but admits to still drinking - unclear how much  . Anxiety   . Arthritis   . 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  [name] COPD (chronic obstructive pulmonary disease) ([place])   . Diabetes type 2, controlled ([place]) 07/31/2017  . Dysrhythmia    a fib  . GERD (gastroesophageal reflux disease)   . Hematuria    refuses work up or referral - understands risks of morbidity / mortality - 11/2008, 12/2008  . History of hiatal hernia   . History of kidney stones   . Hyperlipemia   . Meningioma ([place]) 10/25/2013   Follows with Dr. [name].   . Peripheral vascular disease ([place])    Abdominal Aortic Aneursym  . Pneumonia    as a child  . Radiation 09/18/15-10/25/15   left lower lobe 70.2 Gy  . Seizures ([place]) 02/18/2020  . Tobacco abuse     Patient Active Problem List   Diagnosis Date Noted  . Benign prostatic hyperplasia with nocturia 03/08/2020  . Seizures ([place]) 02/18/2020  . PAF (paroxysmal atrial fibrillation) ([place]) 08/15/2017  . Diabetes type 2, controlled ([place]) 07/31/2017  . Cholecystitis 07/21/2017  . COPD GOLD II  04/03/2017  . 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 Lung cancer ([name]) 11/07/2014  . 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 HTN (hypertension) 04/29/2014  .  Meningioma ([place]) 10/25/2013  . Low back pain 10/25/2013  . 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 KERATOSIS 10/09/2010  . SCIATICA, RIGHT 10/09/2010  . UNSPECIFIED HEARING LOSS 05/21/2010  . Hyperlipidemia 05/14/2010  . 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 LIPOMA 11/17/2009  . MICROSCOPIC HEMATURIA 08/11/2008  . GERD 07/26/2008  . DERMOID CYST 07/25/2008    Past Surgical History:  Procedure Laterality Date  . CHOLECYSTECTOMY N/A 07/23/2017   Procedure: LAPAROSCOPIC CHOLECYSTECTOMY;  Surgeon: Kinsinger, Arta Bruce, MD;  Location: [facility];  Service: General;  Laterality: N/A;  . COLONOSCOPY    . EYE SURGERY Bilateral    Cataracts removed w/ lens implant  . HERNIA REPAIR     Left 36 years ago . Right inguinal hernia repair 10-01-17 Dr. Kieth Brightly  . INGUINAL HERNIA REPAIR Right 10/01/2017   Procedure: RIGHT INGUINAL HERNIA REPAIR WITH MESH;  Surgeon: Kinsinger, Arta Bruce, MD;  [facility]: [facility];  Service: General;  Laterality: Right;  TAP BLOCK  . INSERTION OF MESH Right 10/01/2017   Procedure: INSERTION OF MESH;  Surgeon: Kinsinger, Arta Bruce, MD;  [facility]: [facility];  Service: General;  Laterality: Right;  . TONSILLECTOMY    . TONSILLECTOMY    . VIDEO BRONCHOSCOPY Bilateral 07/26/2015   Procedure:  VIDEO BRONCHOSCOPY WITH FLUORO;  Surgeon: [name], MD;  Location: [name];  Service: Cardiopulmonary;  Laterality: Bilateral;  . VIDEO BRONCHOSCOPY WITH ENDOBRONCHIAL NAVIGATION N/A 08/23/2015   Procedure: VIDEO BRONCHOSCOPY WITH ENDOBRONCHIAL NAVIGATION;  Surgeon: Grace Isaac, MD;  Location: [facility];  Service: Thoracic;  Laterality: N/A;  . VIDEO BRONCHOSCOPY WITH ENDOBRONCHIAL ULTRASOUND N/A 08/23/2015   Procedure: VIDEO BRONCHOSCOPY WITH ENDOBRONCHIAL ULTRASOUND;  Surgeon: Grace Isaac, MD;  Location: [facility];  Service: Thoracic;  Laterality: N/A;       Family History  Problem Relation Age of  Onset  . Leukemia Father   . Emphysema Father   . Learning disabilities Son   . Leukemia Other   . Stroke Other     Social History   Tobacco Use  . Smoking status: Former Smoker    Packs/day: 1.00    Years: 57.00    Pack years: 57.00    Quit date: 08/08/2015    Years since quitting: 4.6  . Smokeless tobacco: Former Systems developer    Types: Chew    Quit date: 11/04/1958  . Tobacco comment: occasional use sneaks around  Substance Use Topics  . Alcohol use: Yes    Alcohol/week: 0.0 standard drinks    Comment: Pt reports rare use hx of denies 10-01-17  . Drug use: No    Home Medications Prior to Admission medications   Medication Sig Start Date End Date Taking? Authorizing Provider  albuterol (PROAIR HFA) 108 (90 Base) MCG/ACT inhaler INHALE 2 PUFFS BY MOUTH INTO THE LUNGS EVERY 6 (SIX) HOURS AS NEEDED FOR WHEEZING OR SHORTNESS OF BREATH. 11/20/18   [name], NP  CARTIA XT 120 MG 24 hr capsule TAKE 1 CAPSULE (120 MG TOTAL) BY MOUTH DAILY. 08/27/19   Nahser, Wonda Cheng, MD  furosemide (LASIX) 20 MG tablet Take 1 tablet (20 mg total) by mouth daily as needed for edema. 07/08/18 10/06/18  Bhagat, Crista Luria, PA  levETIRAcetam (KEPPRA) 500 MG tablet Take 1 tablet (500 mg total) by mouth 2 (two) times daily. 02/18/20   Kathrynn Ducking, MD  omeprazole (PRILOSEC) 40 MG capsule TAKE 1 CAPSULE BY MOUTH DAILY **NEED OFFICE VISIT FOR FURTHER REFILLS** 03/24/20   [name], NP  pravastatin (PRAVACHOL) 80 MG tablet TAKE 1 TABLET (80 MG TOTAL) BY MOUTH AT BEDTIME. 01/07/20   [name], NP  rivaroxaban (XARELTO) 20 MG TABS tablet Take 1 tablet (20 mg total) by mouth daily with supper. 10/22/19   [name], NP  tamsulosin (FLOMAX) 0.4 MG CAPS capsule Take 1 capsule (0.4 mg total) by mouth daily. 03/08/20   [name], NP    Allergies    Iodine and Iohexol  Review of Systems   Review of Systems  Constitutional: Negative for chills and fever.  HENT: Negative for  congestion.   Eyes: Negative for visual disturbance.  Respiratory: Negative for shortness of breath.   Cardiovascular: Negative for chest pain.  Gastrointestinal: Negative for abdominal pain and vomiting.  Genitourinary: Negative for dysuria and flank pain.  Musculoskeletal: Negative for back pain, neck pain and neck stiffness.  Skin: Negative for rash.  Neurological: Negative for weakness, light-headedness, numbness and headaches.    Physical Exam Updated Vital Signs BP (!) 137/95 (BP Location: Left Arm)   Pulse 74   Temp 98.2 F (36.8 C) (Oral)   Resp 18   Ht 5\' 9"  (1.753 m)   Wt 78.5 kg   SpO2 100%   BMI 25.55 kg/m   Physical Exam  Vitals and nursing note reviewed.  Constitutional:      Appearance: He is well-developed.  HENT:     Head: Normocephalic and atraumatic.  Eyes:     General:        Right eye: No discharge.        Left eye: No discharge.     Conjunctiva/sclera: Conjunctivae normal.  Neck:     Trachea: No tracheal deviation.  Cardiovascular:     Rate and Rhythm: Normal rate and regular rhythm.  Pulmonary:     Effort: Pulmonary effort is normal.     Breath sounds: Normal breath sounds.  Abdominal:     General: There is no distension.     Palpations: Abdomen is soft.     Tenderness: There is no abdominal tenderness. There is no guarding.  Musculoskeletal:        General: Swelling and tenderness present.     Cervical back: Normal range of motion.     Comments: Patient has mild pitting edema bilateral lower extremities, tenderness left calf and medial left thigh without external sign of infection.  Compartment soft.  Neurovascularly intact left extremity.  No tenderness with range of motion of left ankle knee or hip.  Skin:    General: Skin is warm.     Findings: No rash.  Neurological:     Mental Status: He is alert and oriented to person, place, and time.  Psychiatric:        Mood and Affect: Mood normal.     ED Results / Procedures / Treatments     Labs (all labs ordered are listed, but only abnormal results are displayed) Labs Reviewed  BASIC METABOLIC PANEL  CBC WITH DIFFERENTIAL/PLATELET    EKG None  Radiology No results found.  Procedures Procedures (including critical care time)  Medications Ordered in ED Medications - No data to display  ED Course  I have reviewed the triage vital signs and the nursing notes.  Pertinent labs & imaging results that were available during my care of the patient were reviewed by me and considered in my medical decision making (see chart for details).    MDM Rules/Calculators/A&P                      Patient presents with left lower extremity tenderness and mild edema.  Discussed importance of taking anticoagulant and that would be the likely treatment however we will do an ultrasound to look for source of his pain looking for DVT.  Plan for screening blood work as well.  Patient care be signed out to follow-up these results and update patient.   Final Clinical Impression(s) / ED Diagnoses Final diagnoses:  Left leg pain    Rx / DC Orders ED Discharge Orders    None       Elnora Morrison, MD 04/02/20 1505

## 2020-04-02 NOTE — ED Triage Notes (Signed)
Pt reports left posterior knee and calf pain x 3 days without known injury. Pt on blood thinners but is noncompliant. Ambulatory with limp.

## 2020-04-05 ENCOUNTER — Other Ambulatory Visit: Payer: Self-pay

## 2020-04-05 ENCOUNTER — Encounter: Payer: Self-pay | Admitting: Family

## 2020-04-05 ENCOUNTER — Ambulatory Visit (INDEPENDENT_AMBULATORY_CARE_PROVIDER_SITE_OTHER): Payer: Medicare Other | Admitting: Family

## 2020-04-05 VITALS — BP 132/69 | HR 65 | Temp 97.0°F | Resp 16 | Ht 69.0 in | Wt 169.0 lb

## 2020-04-05 DIAGNOSIS — M25562 Pain in left knee: Secondary | ICD-10-CM

## 2020-04-05 DIAGNOSIS — E119 Type 2 diabetes mellitus without complications: Secondary | ICD-10-CM

## 2020-04-05 NOTE — Patient Instructions (Addendum)
Please set a bedtime and a wake time.  Do not sleep with the TV on. Avoid any caffeine after lunch. Please call [facility] to schedule an appointment with a counselor.  [phone] You should be contacted about scheduling your appointment with sports medicine.

## 2020-04-05 NOTE — Progress Notes (Signed)
Subjective:    Patient ID: Robert Bates, male    DOB: 11/28/1940, 79 y.o.   MRN: [id number]  HPI  Patient is a 79 yr old male who presents today for ED follow up. ED note is reviewed. He presented on 04/02/2020 with left calf/medial thigh pain.  LE doppler was negative for DVT. CBC and bmet were unremarkable except for mild hyperglycemia. He states that he continues to have left anterior knee pain and pain behind the left knee.  Reports that he feels drowsy during the day. States that he he sleeps from 6AM to 1-2 PM. Then feels groggy during the day.     Review of Systems See HPI  Past Medical History:  Diagnosis Date  . Alcoholism ([place])    past problem per pt but admits to still drinking - unclear how much  . Anxiety   . Arthritis   . 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  [name] COPD (chronic obstructive pulmonary disease) ([place])   . Diabetes type 2, controlled ([place]) 07/31/2017  . Dysrhythmia    a fib  . GERD (gastroesophageal reflux disease)   . Hematuria    refuses work up or referral - understands risks of morbidity / mortality - 11/2008, 12/2008  . History of hiatal hernia   . History of kidney stones   . Hyperlipemia   . Meningioma ([place]) 10/25/2013   Follows with Dr. [name].   . Peripheral vascular disease ([place])    Abdominal Aortic Aneursym  . Pneumonia    as a child  . Radiation 09/18/15-10/25/15   left lower lobe 70.2 Gy  . Seizures ([place]) 02/18/2020  . Tobacco abuse      Social History   Socioeconomic History  . Marital status: Married    Spouse name: Not on file  . Number of children: 2  . Years of education: Not on file  . Highest education level: Not on file  Occupational History  . Occupation: Retired    Fish farm manager: [employer]    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[place] Use  . Smoking status: Former Smoker    Packs/day: 1.00    Years: 57.00    Pack years: 57.00    Quit date: 08/08/2015    Years since quitting: 4.6  . Smokeless tobacco: Former Systems developer    Types: Chew    Quit date: 11/04/1958  . Tobacco comment: occasional use sneaks around  Substance and Sexual Activity  . Alcohol use: Yes    Alcohol/week: 0.0 standard drinks    Comment: Pt reports rare use hx of denies 10-01-17  . Drug use: No  . Sexual activity: Yes  Other Topics Concern  . Not on file  Social History Narrative  . Not on file   Social Determinants of Health   Financial Resource Strain:   . Difficulty of Paying Living Expenses:   Food Insecurity:   . Worried About Charity fundraiser in the Last Year:   . Arboriculturist in the Last Year:   Transportation Needs:   . Film/video editor (Medical):   [name] Lack of Transportation (Non-Medical):   Physical Activity:   . Days of Exercise per Week:   . Minutes of Exercise per Session:   Stress:   . Feeling of Stress :   Social Connections:   . Frequency of Communication with Friends  and Family:   . Frequency of Social Gatherings with Friends and Family:   . Attends Religious Services:   . Active Member of Clubs or Organizations:   . Attends Archivist Meetings:   [name] Marital Status:   Intimate Partner Violence:   . Fear of Current or Ex-Partner:   . Emotionally Abused:   [name] Physically Abused:   . Sexually Abused:     Past Surgical History:  Procedure Laterality Date  . CHOLECYSTECTOMY N/A 07/23/2017   Procedure: LAPAROSCOPIC CHOLECYSTECTOMY;  Surgeon: Kinsinger, Arta Bruce, MD;  [facility];  Service: General;  Laterality: N/A;  . COLONOSCOPY    . EYE SURGERY Bilateral    Cataracts removed w/ lens implant  . HERNIA REPAIR     Left 36 years ago . Right inguinal hernia repair 10-01-17 Dr. Kieth Brightly  . INGUINAL HERNIA REPAIR Right 10/01/2017   Procedure: RIGHT INGUINAL HERNIA REPAIR WITH MESH;  Surgeon: Kinsinger, Arta Bruce, MD;  [facility];  Service: General;  Laterality: Right;   TAP BLOCK  . INSERTION OF MESH Right 10/01/2017   Procedure: INSERTION OF MESH;  Surgeon: Kinsinger, Arta Bruce, MD;  [facility];  Service: General;  Laterality: Right;  . TONSILLECTOMY    . TONSILLECTOMY    . VIDEO BRONCHOSCOPY Bilateral 07/26/2015   Procedure: VIDEO BRONCHOSCOPY WITH FLUORO;  Surgeon: [name], MD;  [facility];  Service: Cardiopulmonary;  Laterality: Bilateral;  . VIDEO BRONCHOSCOPY WITH ENDOBRONCHIAL NAVIGATION N/A 08/23/2015   Procedure: VIDEO BRONCHOSCOPY WITH ENDOBRONCHIAL NAVIGATION;  Surgeon: Grace Isaac, MD;  Location: [facility];  Service: Thoracic;  Laterality: N/A;  . VIDEO BRONCHOSCOPY WITH ENDOBRONCHIAL ULTRASOUND N/A 08/23/2015   Procedure: VIDEO BRONCHOSCOPY WITH ENDOBRONCHIAL ULTRASOUND;  Surgeon: Grace Isaac, MD;  Location: [facility];  Service: Thoracic;  Laterality: N/A;    Family History  Problem Relation Age of Onset  . Leukemia Father   . Emphysema Father   . Learning disabilities Son   . Leukemia Other   . Stroke Other     Allergies  Allergen Reactions  . Iodine Other (See Comments)    neck swells  . Iohexol Swelling    Neck and gland swelling per patient.    Current Outpatient Medications on File Prior to Visit  Medication Sig Dispense Refill  . albuterol (PROAIR HFA) 108 (90 Base) MCG/ACT inhaler INHALE 2 PUFFS BY MOUTH INTO THE LUNGS EVERY 6 (SIX) HOURS AS NEEDED FOR WHEEZING OR SHORTNESS OF BREATH. 8.5 g 5  . CARTIA XT 120 MG 24 hr capsule TAKE 1 CAPSULE (120 MG TOTAL) BY MOUTH DAILY. 90 capsule 2  . levETIRAcetam (KEPPRA) 500 MG tablet Take 1 tablet (500 mg total) by mouth 2 (two) times daily. 180 tablet 1  . omeprazole (PRILOSEC) 40 MG capsule TAKE 1 CAPSULE BY MOUTH DAILY **NEED OFFICE VISIT FOR FURTHER REFILLS** 30 capsule 5  . pravastatin (PRAVACHOL) 80 MG tablet TAKE 1 TABLET (80 MG TOTAL) BY MOUTH AT BEDTIME. 90 tablet 1  . rivaroxaban (XARELTO) 20 MG TABS tablet Take 1 tablet (20 mg total) by mouth  daily with supper. 7 tablet 0  . tamsulosin (FLOMAX) 0.4 MG CAPS capsule Take 1 capsule (0.4 mg total) by mouth daily. 30 capsule 3  . furosemide (LASIX) 20 MG tablet Take 1 tablet (20 mg total) by mouth daily as needed for edema. 30 tablet 1   No current facility-administered medications on file prior to visit.    BP 132/69 (BP Location:  Right Arm, Patient Position: Sitting, Cuff Size: Small)   Pulse 65   Temp (!) 97 F (36.1 C) (Temporal)   Resp 16   Ht 5\' 9"  (1.753 m)   Wt 169 lb (76.7 kg)   SpO2 98%   BMI 24.96 kg/m       Objective:   Physical Exam Constitutional:      General: He is not in acute distress.    Appearance: He is well-developed.  HENT:     Head: Normocephalic and atraumatic.  Cardiovascular:     Rate and Rhythm: Normal rate and regular rhythm.     Heart sounds: No murmur.  Pulmonary:     Effort: Pulmonary effort is normal. No respiratory distress.     Breath sounds: Normal breath sounds. No wheezing or rales.  Musculoskeletal:     Comments: No swelling or tenderness of the left knee.   Skin:    General: Skin is warm and dry.  Neurological:     Mental Status: He is alert and oriented to person, place, and time.  Psychiatric:        Behavior: Behavior normal.        Thought Content: Thought content normal.           Assessment & Plan:  Musculoskeletal pain/knee pain- Need to avoid nsaids due to anticoagulation. Will refer to sports medicine for further evaluation.    He is interested in doing some counseling for stress management. I have given him the number for [facility].  DM2- sugar is stable. Due for urine microalbumin.   Lab Results  Component Value Date   HGBA1C 5.9 03/09/2020     This visit occurred during the SARS-CoV-2 public health emergency.  Safety protocols were in place, including screening questions prior to the visit, additional usage of staff PPE, and extensive cleaning of exam room while observing  appropriate contact time as indicated for disinfecting solutions.

## 2020-04-07 ENCOUNTER — Other Ambulatory Visit: Payer: Self-pay

## 2020-04-07 ENCOUNTER — Ambulatory Visit: Payer: Self-pay

## 2020-04-07 ENCOUNTER — Encounter: Payer: Self-pay | Admitting: Family Medicine

## 2020-04-07 ENCOUNTER — Ambulatory Visit (INDEPENDENT_AMBULATORY_CARE_PROVIDER_SITE_OTHER): Payer: Medicare Other | Admitting: Family Medicine

## 2020-04-07 VITALS — BP 116/70 | HR 69 | Ht 69.0 in | Wt 169.0 lb

## 2020-04-07 DIAGNOSIS — M79605 Pain in left leg: Secondary | ICD-10-CM | POA: Diagnosis not present

## 2020-04-07 DIAGNOSIS — M25562 Pain in left knee: Secondary | ICD-10-CM

## 2020-04-07 MED ORDER — PREDNISONE 5 MG PO TABS
ORAL_TABLET | ORAL | 0 refills | Status: DC
Start: 2020-04-07 — End: 2020-06-01

## 2020-04-07 MED FILL — predniSONE 5 MG TABS: 5 | 6 days supply | Qty: 21 | Fill #0

## 2020-04-07 NOTE — Assessment & Plan Note (Signed)
No structural changes observed on ultrasound of the knee itself.  Doppler was negative for DVT as he reported missed doses of his blood thinner.  Possible for radicular in nature as it seems more posterior.  No changes of the hamstrings themselves. -Counseled on home exercise therapy and supportive care. -Prednisone. -Could consider knee injection. -Could consider x-ray of the knee or of the back.  Could consider physical therapy.

## 2020-04-07 NOTE — Progress Notes (Signed)
Robert Bates - 79 y.o. male MRN [id number]  Date of birth: Jun 04, 1941  SUBJECTIVE:  Including CC & ROS.  Chief Complaint  Patient presents with  . Knee Pain    left x 1 week    Robert Bates is a 79 y.o. male that is presenting with left leg pain.  The pain is occurring over the lateral and posterior aspect of the thigh and down the lower leg.  He denies any inciting event.  Has been ongoing for 1 week.  Denies any numbness or tingling.  No history of injury or surgery to the knee or back.  It is occurring with no specific trigger.   Review of Systems See HPI   HISTORY: Past Medical, Surgical, Social, and Family History Reviewed & Updated per EMR.   Pertinent Historical Findings include:  Past Medical History:  Diagnosis Date  . Alcoholism ([place])    past problem per pt but admits to still drinking - unclear how much  . Anxiety   . Arthritis   . 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  [name] COPD (chronic obstructive pulmonary disease) ([place])   . Diabetes type 2, controlled ([place]) 07/31/2017  . Dysrhythmia    a fib  . GERD (gastroesophageal reflux disease)   . Hematuria    refuses work up or referral - understands risks of morbidity / mortality - 11/2008, 12/2008  . History of hiatal hernia   . History of kidney stones   . Hyperlipemia   . Meningioma ([place]) 10/25/2013   Follows with Dr. [name].   . Peripheral vascular disease ([place])    Abdominal Aortic Aneursym  . Pneumonia    as a child  . Radiation 09/18/15-10/25/15   left lower lobe 70.2 Gy  . Seizures ([place]) 02/18/2020  . Tobacco abuse     Past Surgical History:  Procedure Laterality Date  . CHOLECYSTECTOMY N/A 07/23/2017   Procedure: LAPAROSCOPIC CHOLECYSTECTOMY;  Surgeon: Kinsinger, Arta Bruce, MD;  [facility];  Service: General;  Laterality: N/A;  . COLONOSCOPY    . EYE SURGERY Bilateral    Cataracts removed w/ lens implant    . HERNIA REPAIR     Left 36 years ago . Right inguinal hernia repair 10-01-17 Dr. Kieth Brightly  . INGUINAL HERNIA REPAIR Right 10/01/2017   Procedure: RIGHT INGUINAL HERNIA REPAIR WITH MESH;  Surgeon: Kinsinger, Arta Bruce, MD;  [facility];  Service: General;  Laterality: Right;  TAP BLOCK  . INSERTION OF MESH Right 10/01/2017   Procedure: INSERTION OF MESH;  Surgeon: Kinsinger, Arta Bruce, MD;  [facility];  Service: General;  Laterality: Right;  . TONSILLECTOMY    . TONSILLECTOMY    . VIDEO BRONCHOSCOPY Bilateral 07/26/2015   Procedure: VIDEO BRONCHOSCOPY WITH FLUORO;  Surgeon: [name], MD;  [facility];  Service: Cardiopulmonary;  Laterality: Bilateral;  . VIDEO BRONCHOSCOPY WITH ENDOBRONCHIAL NAVIGATION N/A 08/23/2015   Procedure: VIDEO BRONCHOSCOPY WITH ENDOBRONCHIAL NAVIGATION;  Surgeon: Grace Isaac, MD;  Location: [facility];  Service: Thoracic;  Laterality: N/A;  . VIDEO BRONCHOSCOPY WITH ENDOBRONCHIAL ULTRASOUND N/A 08/23/2015   Procedure: VIDEO BRONCHOSCOPY WITH ENDOBRONCHIAL ULTRASOUND;  Surgeon: Grace Isaac, MD;  [facility];  Service: Thoracic;  Laterality: N/A;    Family History  Problem Relation Age of Onset  . Leukemia Father   . Emphysema Father   . Learning disabilities Son   .  Leukemia Other   . Stroke Other     Social History   Socioeconomic History  . Marital status: Married    Spouse name: Not on file  . Number of children: 2  . Years of education: Not on file  . Highest education level: Not on file  Occupational History  . 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 Smoking status: Former Smoker    Packs/day: 1.00    Years: 57.00    Pack years: 57.00    Quit date: 08/08/2015    Years since quitting: 4.6  . Smokeless tobacco: Former Systems developer    Types: Chew    Quit date: 11/04/1958  . Tobacco comment: occasional use sneaks around  Substance and  Sexual Activity  . Alcohol use: Yes    Alcohol/week: 0.0 standard drinks    Comment: Pt reports rare use hx of denies 10-01-17  . Drug use: No  . Sexual activity: Yes  Other Topics Concern  . Not on file  Social History Narrative  . Not on file   Social Determinants of Health   Financial Resource Strain:   . Difficulty of Paying Living Expenses:   Food Insecurity:   . Worried About Charity fundraiser in the Last Year:   . Arboriculturist in the Last Year:   Transportation Needs:   . Film/video editor (Medical):   [name] Lack of Transportation (Non-Medical):   Physical Activity:   . Days of Exercise per Week:   . Minutes of Exercise per Session:   Stress:   . Feeling of Stress :   Social Connections:   . Frequency of Communication with Friends and Family:   . Frequency of Social Gatherings with Friends and Family:   . Attends Religious Services:   . Active Member of Clubs or Organizations:   . Attends Archivist Meetings:   [name] Marital Status:   Intimate Partner Violence:   . Fear of Current or Ex-Partner:   . Emotionally Abused:   [name] Physically Abused:   . Sexually Abused:      PHYSICAL EXAM:  VS: BP 116/70   Pulse 69   Ht 5\' 9"  (1.753 m)   Wt 169 lb (76.7 kg)   BMI 24.96 kg/m  Physical Exam Gen: NAD, alert, cooperative with exam, well-appearing MSK:  Left leg: Normal internal and external rotation of the hip. Normal strength resistance with hip flexion. Normal strength resistance with knee flexion extension. No effusion at the knee. No instability at the knee. Negative McMurray's test. Normal plantar flexion dorsiflexion. Negative straight leg raise. Neurovascular intact  Limited ultrasound: Left knee/leg:  No effusion of the suprapatellar pouch. Normal-appearing quadricep and patellar tendon. Mild effusion noted at the insertion of the patellar tendon. Normal-appearing joint space and meniscus on the medial and lateral aspect. Mild Baker's  cyst in the posterior compartment. No changes of the hamstring tendons or of the mid belly muscles.  Summary: Mild changes of the patellar tendon and a Baker's cyst but no specific identified source of his pain.  Ultrasound and interpretation by Clearance Coots, MD    ASSESSMENT & PLAN:   Left leg pain No structural changes observed on ultrasound of the knee itself.  Doppler was negative for DVT as he reported missed doses of his blood thinner.  Possible for radicular in nature as it seems more posterior.  No  changes of the hamstrings themselves. -Counseled on home exercise therapy and supportive care. -Prednisone. -Could consider knee injection. -Could consider x-ray of the knee or of the back.  Could consider physical therapy.

## 2020-04-07 NOTE — Patient Instructions (Signed)
Nice to meet you  Please try the exercises  Please try heat or ice  Please try the exercises  Please send me a message in MyChart with any questions or updates.  Please see me back in 4 weeks or sooner if needed.   --Dr. Raeford Razor

## 2020-04-28 MED FILL — CARTIA XT 120 MG CP24: 120 | 90 days supply | Qty: 90 | Fill #2

## 2020-04-28 MED FILL — SYMBICORT 160-4.5 MCG INH: 160-4.5 | 30 days supply | Qty: 10 | Fill #2

## 2020-04-28 MED FILL — OMEPRAZOLE 40 MG CPDR: 40 | 30 days supply | Qty: 30 | Fill #1

## 2020-04-28 MED FILL — XARELTO 20 MG TABLET: 20 | 30 days supply | Qty: 30 | Fill #6

## 2020-05-03 ENCOUNTER — Ambulatory Visit: Payer: Medicare Other | Admitting: Neurology

## 2020-05-05 ENCOUNTER — Ambulatory Visit: Payer: Medicare Other | Admitting: Family Medicine

## 2020-05-05 NOTE — Progress Notes (Deleted)
Robert Bates - 79 y.o. male MRN [id number]  Date of birth: August 27, 1941  SUBJECTIVE:  Including CC & ROS.  No chief complaint on file.   Robert Bates is a 79 y.o. male that is  ***.  ***   Review of Systems See HPI   HISTORY: Past Medical, Surgical, Social, and Family History Reviewed & Updated per EMR.   Pertinent Historical Findings include:  Past Medical History:  Diagnosis Date  . Alcoholism ([place])    past problem per pt but admits to still drinking - unclear how much  . Anxiety   . Arthritis   . 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  [name] COPD (chronic obstructive pulmonary disease) ([place])   . Diabetes type 2, controlled ([place]) 07/31/2017  . Dysrhythmia    a fib  . GERD (gastroesophageal reflux disease)   . Hematuria    refuses work up or referral - understands risks of morbidity / mortality - 11/2008, 12/2008  . History of hiatal hernia   . History of kidney stones   . Hyperlipemia   . Meningioma ([place]) 10/25/2013   Follows with Dr. [name].   . Peripheral vascular disease ([place])    Abdominal Aortic Aneursym  . Pneumonia    as a child  . Radiation 09/18/15-10/25/15   left lower lobe 70.2 Gy  . Seizures ([place]) 02/18/2020  . Tobacco abuse     Past Surgical History:  Procedure Laterality Date  . CHOLECYSTECTOMY N/A 07/23/2017   Procedure: LAPAROSCOPIC CHOLECYSTECTOMY;  Surgeon: Kinsinger, Arta Bruce, MD;  [facility];  Service: General;  Laterality: N/A;  . COLONOSCOPY    . EYE SURGERY Bilateral    Cataracts removed w/ lens implant  . HERNIA REPAIR     Left 36 years ago . Right inguinal hernia repair 10-01-17 Dr. Kieth Brightly  . INGUINAL HERNIA REPAIR Right 10/01/2017   Procedure: RIGHT INGUINAL HERNIA REPAIR WITH MESH;  Surgeon: Kinsinger, Arta Bruce, MD;  [facility];  Service: General;  Laterality: Right;  TAP BLOCK  . INSERTION OF MESH Right 10/01/2017   Procedure:  INSERTION OF MESH;  Surgeon: Kinsinger, Arta Bruce, MD;  [facility];  Service: General;  Laterality: Right;  . TONSILLECTOMY    . TONSILLECTOMY    . VIDEO BRONCHOSCOPY Bilateral 07/26/2015   Procedure: VIDEO BRONCHOSCOPY WITH FLUORO;  Surgeon: [name], MD;  [facility];  Service: Cardiopulmonary;  Laterality: Bilateral;  . VIDEO BRONCHOSCOPY WITH ENDOBRONCHIAL NAVIGATION N/A 08/23/2015   Procedure: VIDEO BRONCHOSCOPY WITH ENDOBRONCHIAL NAVIGATION;  Surgeon: Grace Isaac, MD;  Location: [place] [facility] [place];  Service: Thoracic;  Laterality: N/A;  . VIDEO BRONCHOSCOPY WITH ENDOBRONCHIAL ULTRASOUND N/A 08/23/2015   Procedure: VIDEO BRONCHOSCOPY WITH ENDOBRONCHIAL ULTRASOUND;  Surgeon: Grace Isaac, MD;  [facility];  Service: Thoracic;  Laterality: N/A;    Family History  Problem Relation Age of Onset  . Leukemia Father   . Emphysema Father   . Learning disabilities Son   . Leukemia Other   . Stroke Other     Social History   Socioeconomic History  . Marital status: Married    Spouse name: Not on file  . Number of children: 2  . Years of education: Not on file  . Highest education level: Not on file  Occupational History  . Occupation: Retired    Fish farm manager: [employer]    Comment: truck Geophysicist/field seismologist  Employer: [employer]  Tobacco Use  . Smoking status: Former Smoker    Packs/day: 1.00    Years: 57.00    Pack years: 57.00    Quit date: 08/08/2015    Years since quitting: 4.7  . Smokeless tobacco: Former Systems developer    Types: Chew    Quit date: 11/04/1958  . Tobacco comment: occasional use sneaks around  Vaping Use  . Vaping Use: Every day  Substance and Sexual Activity  . Alcohol use: Yes    Alcohol/week: 0.0 standard drinks    Comment: Pt reports rare use hx of denies 10-01-17  . Drug use: No  . Sexual activity: Yes  Other Topics Concern  . Not on file  Social History Narrative  . Not on file   Social Determinants of Health   Financial Resource  Strain:   . Difficulty of Paying Living Expenses:   Food Insecurity:   . Worried About Charity fundraiser in the Last Year:   . Arboriculturist in the Last Year:   Transportation Needs:   . Film/video editor (Medical):   [name] Lack of Transportation (Non-Medical):   Physical Activity:   . Days of Exercise per Week:   . Minutes of Exercise per Session:   Stress:   . Feeling of Stress :   Social Connections:   . Frequency of Communication with Friends and Family:   . Frequency of Social Gatherings with Friends and Family:   . Attends Religious Services:   . Active Member of Clubs or Organizations:   . Attends Archivist Meetings:   [name] Marital Status:   Intimate Partner Violence:   . Fear of Current or Ex-Partner:   . Emotionally Abused:   [name] Physically Abused:   . Sexually Abused:      PHYSICAL EXAM:  VS: There were no vitals taken for this visit. Physical Exam Gen: NAD, alert, cooperative with exam, well-appearing MSK:  ***      ASSESSMENT & PLAN:   No problem-specific Assessment & Plan notes found for this encounter.

## 2020-05-30 MED FILL — OMEPRAZOLE 40 MG CPDR: 40 | 30 days supply | Qty: 30 | Fill #2

## 2020-05-30 MED FILL — XARELTO 20 MG TABLET: 20 | 30 days supply | Qty: 30 | Fill #7

## 2020-05-30 MED FILL — PRAVASTATIN NA 80 MG TAB: 80 | 90 days supply | Qty: 90 | Fill #1

## 2020-06-01 ENCOUNTER — Other Ambulatory Visit: Payer: Self-pay

## 2020-06-01 ENCOUNTER — Ambulatory Visit (INDEPENDENT_AMBULATORY_CARE_PROVIDER_SITE_OTHER): Payer: Medicare Other | Admitting: Family

## 2020-06-01 VITALS — BP 128/73 | HR 59 | Temp 98.0°F | Resp 16 | Wt 167.0 lb

## 2020-06-01 DIAGNOSIS — C3410 Malignant neoplasm of upper lobe, unspecified bronchus or lung: Secondary | ICD-10-CM | POA: Diagnosis not present

## 2020-06-01 DIAGNOSIS — D329 Benign neoplasm of meninges, unspecified: Secondary | ICD-10-CM

## 2020-06-01 DIAGNOSIS — K219 Gastro-esophageal reflux disease without esophagitis: Secondary | ICD-10-CM

## 2020-06-01 DIAGNOSIS — E119 Type 2 diabetes mellitus without complications: Secondary | ICD-10-CM

## 2020-06-01 DIAGNOSIS — R569 Unspecified convulsions: Secondary | ICD-10-CM | POA: Diagnosis not present

## 2020-06-01 DIAGNOSIS — N400 Enlarged prostate without lower urinary tract symptoms: Secondary | ICD-10-CM

## 2020-06-01 DIAGNOSIS — I480 Paroxysmal atrial fibrillation: Secondary | ICD-10-CM

## 2020-06-01 LAB — MICROALBUMIN / CREATININE URINE RATIO
Creatinine,U: 161.5 mg/dL
Microalb Creat Ratio: 0.6 mg/g (ref 0.0–30.0)
Microalb, Ur: 1 mg/dL (ref 0.0–1.9)

## 2020-06-01 MED FILL — levETIRAcetam 500 MG TABS: 500 | 30 days supply | Qty: 60 | Fill #1

## 2020-06-01 NOTE — Patient Instructions (Addendum)
Restart Keppra (seizure medicine) and schedule a follow up with Neurology (Dr. Jannifer Franklin). Complete lab work prior to leaving.

## 2020-06-01 NOTE — Progress Notes (Addendum)
Subjective:    Patient ID: Robert Bates, male    DOB: Mar 04, 1941, 79 y.o.   MRN: [id number]  HPI  Patient is a 79 yr old male who presents today for follow up.  DM2- Diet controlled.   Lab Results  Component Value Date   HGBA1C 5.9 03/09/2020   HGBA1C 6.2 11/20/2018   HGBA1C 6.1 12/25/2017   Lab Results  Component Value Date   LDLCALC 76 03/09/2020   CREATININE 0.98 04/02/2020   Hx of seizure- Reports that he stopped keppra.  Denies any seizures since last visit. He is anxious to get back to work.   BPH- reports that is urinating without difficulty- stopped flomax.  AF- reports that he is continuing xarelto.    GERD- reports symptoms are stable on prilosec.  Denies alcohol use.     Review of Systems Past Medical History:  Diagnosis Date  . Alcoholism ([place])    past problem per pt but admits to still drinking - unclear how much  . Anxiety   . Arthritis   . 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  [name] COPD (chronic obstructive pulmonary disease) ([place])   . Diabetes type 2, controlled ([place]) 07/31/2017  . Dysrhythmia    a fib  . GERD (gastroesophageal reflux disease)   . Hematuria    refuses work up or referral - understands risks of morbidity / mortality - 11/2008, 12/2008  . History of hiatal hernia   . History of kidney stones   . Hyperlipemia   . Meningioma ([place]) 10/25/2013   Follows with Dr. [name].   . Peripheral vascular disease ([place])    Abdominal Aortic Aneursym  . Pneumonia    as a child  . Radiation 09/18/15-10/25/15   left lower lobe 70.2 Gy  . Seizures ([place]) 02/18/2020  . Tobacco abuse      Social History   Socioeconomic History  . Marital status: Married    Spouse name: Not on file  . Number of children: 2  . Years of education: Not on file  . Highest education level: Not on file  Occupational History  . 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 Smoking status: Former Smoker    Packs/day: 1.00    Years: 57.00    Pack years: 57.00    Quit date: 08/08/2015    Years since quitting: 4.8  . Smokeless tobacco: Former Systems developer    Types: Chew    Quit date: 11/04/1958  . Tobacco comment: occasional use sneaks around  Vaping Use  . Vaping Use: Every day  Substance and Sexual Activity  . Alcohol use: Yes    Alcohol/week: 0.0 standard drinks    Comment: Pt reports rare use hx of denies 10-01-17  . Drug use: No  . Sexual activity: Yes  Other Topics Concern  . Not on file  Social History Narrative  . Not on file   Social Determinants of Health   Financial Resource Strain:   . Difficulty of Paying Living Expenses:   Food Insecurity:   . Worried About Charity fundraiser in the Last Year:   . Arboriculturist in the Last Year:   Transportation Needs:   . Film/video editor (Medical):   [name] Lack of Transportation (Non-Medical):   Physical Activity:   .  Days of Exercise per Week:   . Minutes of Exercise per Session:   Stress:   . Feeling of Stress :   Social Connections:   . Frequency of Communication with Friends and Family:   . Frequency of Social Gatherings with Friends and Family:   . Attends Religious Services:   . Active Member of Clubs or Organizations:   . Attends Archivist Meetings:   [name] Marital Status:   Intimate Partner Violence:   . Fear of Current or Ex-Partner:   . Emotionally Abused:   [name] Physically Abused:   . Sexually Abused:     Past Surgical History:  Procedure Laterality Date  . CHOLECYSTECTOMY N/A 07/23/2017   Procedure: LAPAROSCOPIC CHOLECYSTECTOMY;  Surgeon: Kinsinger, Arta Bruce, MD;  [facility];  Service: General;  Laterality: N/A;  . COLONOSCOPY    . EYE SURGERY Bilateral    Cataracts removed w/ lens implant  . HERNIA REPAIR     Left 36 years ago . Right inguinal hernia repair 10-01-17 Dr. Kieth Brightly  . INGUINAL  HERNIA REPAIR Right 10/01/2017   Procedure: RIGHT INGUINAL HERNIA REPAIR WITH MESH;  Surgeon: Kinsinger, Arta Bruce, MD;  [facility];  Service: General;  Laterality: Right;  TAP BLOCK  . INSERTION OF MESH Right 10/01/2017   Procedure: INSERTION OF MESH;  Surgeon: Kinsinger, Arta Bruce, MD;  [facility];  Service: General;  Laterality: Right;  . TONSILLECTOMY    . TONSILLECTOMY    . VIDEO BRONCHOSCOPY Bilateral 07/26/2015   Procedure: VIDEO BRONCHOSCOPY WITH FLUORO;  Surgeon: [name], MD;  [facility];  Service: Cardiopulmonary;  Laterality: Bilateral;  . VIDEO BRONCHOSCOPY WITH ENDOBRONCHIAL NAVIGATION N/A 08/23/2015   Procedure: VIDEO BRONCHOSCOPY WITH ENDOBRONCHIAL NAVIGATION;  Surgeon: Grace Isaac, MD;  Location: [facility];  Service: Thoracic;  Laterality: N/A;  . VIDEO BRONCHOSCOPY WITH ENDOBRONCHIAL ULTRASOUND N/A 08/23/2015   Procedure: VIDEO BRONCHOSCOPY WITH ENDOBRONCHIAL ULTRASOUND;  Surgeon: Grace Isaac, MD;  [facility];  Service: Thoracic;  Laterality: N/A;    Family History  Problem Relation Age of Onset  . Leukemia Father   . Emphysema Father   . Learning disabilities Son   . Leukemia Other   . Stroke Other     Allergies  Allergen Reactions  . Iodine Other (See Comments)    neck swells  . Iohexol Swelling    Neck and gland swelling per patient.    Current Outpatient Medications on File Prior to Visit  Medication Sig Dispense Refill  . albuterol (PROAIR HFA) 108 (90 Base) MCG/ACT inhaler INHALE 2 PUFFS BY MOUTH INTO THE LUNGS EVERY 6 (SIX) HOURS AS NEEDED FOR WHEEZING OR SHORTNESS OF BREATH. 8.5 g 5  . CARTIA XT 120 MG 24 hr capsule TAKE 1 CAPSULE (120 MG TOTAL) BY MOUTH DAILY. 90 capsule 2  . furosemide (LASIX) 20 MG tablet Take 1 tablet (20 mg total) by mouth daily as needed for edema. 30 tablet 1  . levETIRAcetam (KEPPRA) 500 MG tablet Take 1 tablet (500 mg total) by mouth 2 (two) times daily. 180 tablet 1  . omeprazole  (PRILOSEC) 40 MG capsule TAKE 1 CAPSULE BY MOUTH DAILY **NEED OFFICE VISIT FOR FURTHER REFILLS** 30 capsule 5  . pravastatin (PRAVACHOL) 80 MG tablet TAKE 1 TABLET (80 MG TOTAL) BY MOUTH AT BEDTIME. 90 tablet 1  . predniSONE (DELTASONE) 5 MG tablet Take 6 pills for first day, 5 pills second day, 4 pills third day, 3 pills fourth day,  2 pills the fifth day, and 1 pill sixth day. 21 tablet 0  . rivaroxaban (XARELTO) 20 MG TABS tablet Take 1 tablet (20 mg total) by mouth daily with supper. 7 tablet 0   No current facility-administered medications on file prior to visit.    BP 128/73 (BP Location: Right Arm, Patient Position: Sitting, Cuff Size: Small)   Pulse 59   Temp 98 F (36.7 C) (Oral)   Resp 16   Wt 167 lb (75.8 kg)   SpO2 99%   BMI 24.66 kg/m       Objective:   Physical Exam Constitutional:      General: He is not in acute distress.    Appearance: He is well-developed.  HENT:     Head: Normocephalic and atraumatic.  Cardiovascular:     Rate and Rhythm: Normal rate and regular rhythm.     Heart sounds: No murmur heard.   Pulmonary:     Effort: Pulmonary effort is normal. No respiratory distress.     Breath sounds: No stridor. Wheezing present. No rales.  Skin:    General: Skin is warm and dry.  Neurological:     Mental Status: He is alert and oriented to person, place, and time.  Psychiatric:        Behavior: Behavior normal.        Thought Content: Thought content normal.           Assessment & Plan:  DM2- clinically stable. Obtain follow up A1C.  Hx of seizure- not taking keppra. Advised pt to restart keppra and schedule follow up with neurology.    BPH- clinically stable following discontinuation of flomax.   Atrial fibrillation- rate stable. Continues xarelto.  GERD- stable on PPI.  Meningioma- clinically stable. Pt advised to follow up with neurology.  Hx of lung ca- past due for follow up with  Oncology. Will arrange follow up appointment.  This  visit occurred during the SARS-CoV-2 public health emergency.  Safety protocols were in place, including screening questions prior to the visit, additional usage of staff PPE, and extensive cleaning of exam room while observing appropriate contact time as indicated for disinfecting solutions.

## 2020-06-04 ENCOUNTER — Encounter: Payer: Self-pay | Admitting: Family

## 2020-06-04 ENCOUNTER — Telehealth: Payer: Self-pay | Admitting: Family

## 2020-06-04 NOTE — Telephone Encounter (Signed)
Please advise pt that I would like him to follow up with Dr. Julien Nordmann for surveillance of his lung cancer.

## 2020-06-05 NOTE — Telephone Encounter (Signed)
Called lvm for call back to inform of message

## 2020-06-10 NOTE — Telephone Encounter (Signed)
Please mail a letter if you are unable to get in touch with patient.

## 2020-06-12 ENCOUNTER — Other Ambulatory Visit: Payer: Self-pay | Admitting: Medical Oncology

## 2020-06-12 DIAGNOSIS — C3490 Malignant neoplasm of unspecified part of unspecified bronchus or lung: Secondary | ICD-10-CM

## 2020-06-12 NOTE — Telephone Encounter (Signed)
Called pt home phone and lvm , if no call back tomorrow will send letter.

## 2020-06-12 NOTE — Telephone Encounter (Signed)
Pt notified about seeing Dr.Mohammed

## 2020-06-13 ENCOUNTER — Telehealth: Payer: Self-pay | Admitting: Internal Medicine

## 2020-06-13 NOTE — Telephone Encounter (Signed)
Scheduled appt per 8/9 sch msg- no answer - left message for patient with appt date and time

## 2020-06-20 ENCOUNTER — Ambulatory Visit (HOSPITAL_BASED_OUTPATIENT_CLINIC_OR_DEPARTMENT_OTHER)
Admission: RE | Admit: 2020-06-20 | Discharge: 2020-06-20 | Disposition: A | Discharge status: Home / Self Care | Payer: Medicare Other | Source: Ambulatory Visit | Attending: Internal Medicine | Admitting: Internal Medicine

## 2020-06-20 ENCOUNTER — Inpatient Hospital Stay: Payer: Medicare Other | Attending: Hematology & Oncology

## 2020-06-20 ENCOUNTER — Other Ambulatory Visit: Payer: Self-pay

## 2020-06-20 ENCOUNTER — Other Ambulatory Visit: Payer: Medicare Other

## 2020-06-20 DIAGNOSIS — Z79899 Other long term (current) drug therapy: Secondary | ICD-10-CM | POA: Insufficient documentation

## 2020-06-20 DIAGNOSIS — S2232XA Fracture of one rib, left side, initial encounter for closed fracture: Secondary | ICD-10-CM | POA: Diagnosis not present

## 2020-06-20 DIAGNOSIS — J449 Chronic obstructive pulmonary disease, unspecified: Secondary | ICD-10-CM | POA: Insufficient documentation

## 2020-06-20 DIAGNOSIS — K219 Gastro-esophageal reflux disease without esophagitis: Secondary | ICD-10-CM | POA: Diagnosis not present

## 2020-06-20 DIAGNOSIS — C3432 Malignant neoplasm of lower lobe, left bronchus or lung: Secondary | ICD-10-CM | POA: Diagnosis not present

## 2020-06-20 DIAGNOSIS — I700 Atherosclerosis of aorta: Secondary | ICD-10-CM | POA: Diagnosis not present

## 2020-06-20 DIAGNOSIS — E785 Hyperlipidemia, unspecified: Secondary | ICD-10-CM | POA: Insufficient documentation

## 2020-06-20 DIAGNOSIS — C3490 Malignant neoplasm of unspecified part of unspecified bronchus or lung: Secondary | ICD-10-CM | POA: Diagnosis not present

## 2020-06-20 DIAGNOSIS — Z7901 Long term (current) use of anticoagulants: Secondary | ICD-10-CM | POA: Diagnosis not present

## 2020-06-20 DIAGNOSIS — E1136 Type 2 diabetes mellitus with diabetic cataract: Secondary | ICD-10-CM | POA: Insufficient documentation

## 2020-06-20 DIAGNOSIS — E1151 Type 2 diabetes mellitus with diabetic peripheral angiopathy without gangrene: Secondary | ICD-10-CM | POA: Insufficient documentation

## 2020-06-20 DIAGNOSIS — I77810 Thoracic aortic ectasia: Secondary | ICD-10-CM | POA: Diagnosis not present

## 2020-06-20 DIAGNOSIS — M1990 Unspecified osteoarthritis, unspecified site: Secondary | ICD-10-CM | POA: Insufficient documentation

## 2020-06-20 DIAGNOSIS — K449 Diaphragmatic hernia without obstruction or gangrene: Secondary | ICD-10-CM | POA: Diagnosis not present

## 2020-06-20 DIAGNOSIS — J432 Centrilobular emphysema: Secondary | ICD-10-CM | POA: Diagnosis not present

## 2020-06-20 DIAGNOSIS — I2510 Atherosclerotic heart disease of native coronary artery without angina pectoris: Secondary | ICD-10-CM | POA: Diagnosis not present

## 2020-06-20 LAB — CBC WITH DIFFERENTIAL (CANCER CENTER ONLY)
Abs Immature Granulocytes: 0.04 10*3/uL (ref 0.00–0.07)
Basophils Absolute: 0 10*3/uL (ref 0.0–0.1)
Basophils Relative: 0 %
Eosinophils Absolute: 0.1 10*3/uL (ref 0.0–0.5)
Eosinophils Relative: 2 %
HCT: 42.6 % (ref 39.0–52.0)
Hemoglobin: 14 g/dL (ref 13.0–17.0)
Immature Granulocytes: 1 %
Lymphocytes Relative: 16 %
Lymphs Abs: 1.3 10*3/uL (ref 0.7–4.0)
MCH: 30.6 pg (ref 26.0–34.0)
MCHC: 32.9 g/dL (ref 30.0–36.0)
MCV: 93 fL (ref 80.0–100.0)
Monocytes Absolute: 0.7 10*3/uL (ref 0.1–1.0)
Monocytes Relative: 8 %
Neutro Abs: 6.1 10*3/uL (ref 1.7–7.7)
Neutrophils Relative %: 73 %
Platelet Count: 229 10*3/uL (ref 150–400)
RBC: 4.58 MIL/uL (ref 4.22–5.81)
RDW: 12.7 % (ref 11.5–15.5)
WBC Count: 8.2 10*3/uL (ref 4.0–10.5)
nRBC: 0 % (ref 0.0–0.2)

## 2020-06-20 LAB — CMP (CANCER CENTER ONLY)
ALT: 7 U/L (ref 0–44)
AST: 10 U/L — ABNORMAL LOW (ref 15–41)
Albumin: 4 g/dL (ref 3.5–5.0)
Alkaline Phosphatase: 71 U/L (ref 38–126)
Anion gap: 6 (ref 5–15)
BUN: 16 mg/dL (ref 8–23)
CO2: 32 mmol/L (ref 22–32)
Calcium: 9.6 mg/dL (ref 8.9–10.3)
Chloride: 104 mmol/L (ref 98–111)
Creatinine: 1.01 mg/dL (ref 0.61–1.24)
GFR, Est AFR Am: >60 mL/min (ref >60)
GFR, Estimated: >60 mL/min (ref >60)
Glucose, Bld: 148 mg/dL — ABNORMAL HIGH (ref 70–99)
Potassium: 4 mmol/L (ref 3.5–5.1)
Sodium: 142 mmol/L (ref 135–145)
Total Bilirubin: 0.4 mg/dL (ref 0.3–1.2)
Total Protein: 5.9 g/dL — ABNORMAL LOW (ref 6.5–8.1)

## 2020-06-22 ENCOUNTER — Inpatient Hospital Stay (HOSPITAL_BASED_OUTPATIENT_CLINIC_OR_DEPARTMENT_OTHER): Payer: Medicare Other | Admitting: Internal Medicine

## 2020-06-22 ENCOUNTER — Encounter: Payer: Self-pay | Admitting: Internal Medicine

## 2020-06-22 ENCOUNTER — Other Ambulatory Visit: Payer: Self-pay

## 2020-06-22 DIAGNOSIS — C3490 Malignant neoplasm of unspecified part of unspecified bronchus or lung: Secondary | ICD-10-CM

## 2020-06-22 DIAGNOSIS — Z79899 Other long term (current) drug therapy: Secondary | ICD-10-CM | POA: Diagnosis not present

## 2020-06-22 DIAGNOSIS — J449 Chronic obstructive pulmonary disease, unspecified: Secondary | ICD-10-CM | POA: Diagnosis not present

## 2020-06-22 DIAGNOSIS — K219 Gastro-esophageal reflux disease without esophagitis: Secondary | ICD-10-CM | POA: Diagnosis not present

## 2020-06-22 DIAGNOSIS — C3432 Malignant neoplasm of lower lobe, left bronchus or lung: Secondary | ICD-10-CM | POA: Diagnosis not present

## 2020-06-22 DIAGNOSIS — M1990 Unspecified osteoarthritis, unspecified site: Secondary | ICD-10-CM | POA: Diagnosis not present

## 2020-06-22 DIAGNOSIS — K449 Diaphragmatic hernia without obstruction or gangrene: Secondary | ICD-10-CM | POA: Diagnosis not present

## 2020-06-22 DIAGNOSIS — Z7901 Long term (current) use of anticoagulants: Secondary | ICD-10-CM | POA: Diagnosis not present

## 2020-06-22 DIAGNOSIS — I2510 Atherosclerotic heart disease of native coronary artery without angina pectoris: Secondary | ICD-10-CM | POA: Diagnosis not present

## 2020-06-22 DIAGNOSIS — E785 Hyperlipidemia, unspecified: Secondary | ICD-10-CM | POA: Diagnosis not present

## 2020-06-22 DIAGNOSIS — I700 Atherosclerosis of aorta: Secondary | ICD-10-CM | POA: Diagnosis not present

## 2020-06-22 DIAGNOSIS — E1136 Type 2 diabetes mellitus with diabetic cataract: Secondary | ICD-10-CM | POA: Diagnosis not present

## 2020-06-22 DIAGNOSIS — E1151 Type 2 diabetes mellitus with diabetic peripheral angiopathy without gangrene: Secondary | ICD-10-CM | POA: Diagnosis not present

## 2020-06-22 NOTE — Progress Notes (Signed)
[place] [phone]   [phone]  OFFICE PROGRESS NOTE  [name], NP [place] [address]  DIAGNOSIS: Stage IIB (T3, N0, M0) non-small cell lung cancer, squamous cell carcinoma presented with left lower lobe endobronchial lesion as well as suspicious groundglass opacity in the left upper lobe diagnosed in September 2016.  PRIOR THERAPY: Curative radiotherapy to the left upper lobe lung mass under the care of Dr. Sondra Come completed 10/25/2015.  CURRENT THERAPY: Observation  INTERVAL HISTORY: Robert Bates 79 y.o. male returns to the clinic today for follow-up visit.  The patient was seen 2 years ago.  He denied having any current chest pain, shortness of breath, cough or hemoptysis.  He denied having any nausea, vomiting, diarrhea or constipation.  He has no headache or visual changes.  He denied having any weight loss or night sweats.  He continues to have some wheezing.  He is followed by pulmonary medicine but have not seen them for a while.  The patient is here today for evaluation with repeat CT scan of the chest for restaging of his disease.  MEDICAL HISTORY: Past Medical History:  Diagnosis Date  . Alcoholism ([place])    past problem per pt but admits to still drinking - unclear how much  . Anxiety   . Arthritis   . Cancer ([place]) 2016   lung- 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  [name] COPD (chronic obstructive pulmonary disease) ([place])   . Diabetes type 2, controlled ([place]) 07/31/2017  . Dysrhythmia    a fib  . GERD (gastroesophageal reflux disease)   . Hematuria    refuses work up or referral - understands risks of morbidity / mortality - 11/2008, 12/2008  . History of hiatal hernia   . History of kidney stones   . Hyperlipemia   . Meningioma ([place]) 10/25/2013   Follows with Dr. [name].   . Peripheral vascular disease ([employer])     Abdominal Aortic Aneursym  . Pneumonia    as a child  . Radiation 09/18/15-10/25/15   left lower lobe 70.2 Gy  . Seizures ([place]) 02/18/2020  . Tobacco abuse     ALLERGIES:  is allergic to iodine and iohexol.  MEDICATIONS:  Current Outpatient Medications  Medication Sig Dispense Refill  . albuterol (PROAIR HFA) 108 (90 Base) MCG/ACT inhaler INHALE 2 PUFFS BY MOUTH INTO THE LUNGS EVERY 6 (SIX) HOURS AS NEEDED FOR WHEEZING OR SHORTNESS OF BREATH. 8.5 g 5  . CARTIA XT 120 MG 24 hr capsule TAKE 1 CAPSULE (120 MG TOTAL) BY MOUTH DAILY. 90 capsule 2  . levETIRAcetam (KEPPRA) 500 MG tablet Take 1 tablet (500 mg total) by mouth 2 (two) times daily. 180 tablet 1  . omeprazole (PRILOSEC) 40 MG capsule TAKE 1 CAPSULE BY MOUTH DAILY **NEED OFFICE VISIT FOR FURTHER REFILLS** 30 capsule 5  . pravastatin (PRAVACHOL) 80 MG tablet TAKE 1 TABLET (80 MG TOTAL) BY MOUTH AT BEDTIME. 90 tablet 1  . rivaroxaban (XARELTO) 20 MG TABS tablet Take 1 tablet (20 mg total) by mouth daily with supper. 7 tablet 0  . SYMBICORT 160-4.5 MCG/ACT inhaler Inhale 2 puffs into the lungs 2 (two) times daily.     No current facility-administered medications for this visit.    SURGICAL HISTORY:  Past Surgical History:  Procedure Laterality Date  . CHOLECYSTECTOMY N/A 07/23/2017   Procedure: LAPAROSCOPIC  CHOLECYSTECTOMY;  Surgeon: Kinsinger, Arta Bruce, MD;  Location: [facility];  Service: General;  Laterality: N/A;  . COLONOSCOPY    . EYE SURGERY Bilateral    Cataracts removed w/ lens implant  . HERNIA REPAIR     Left 36 years ago . Right inguinal hernia repair 10-01-17 Dr. Kieth Brightly  . INGUINAL HERNIA REPAIR Right 10/01/2017   Procedure: RIGHT INGUINAL HERNIA REPAIR WITH MESH;  Surgeon: Kinsinger, Arta Bruce, MD;  Location: [facility];  Service: General;  Laterality: Right;  TAP BLOCK  . INSERTION OF MESH Right 10/01/2017   Procedure: INSERTION OF MESH;  Surgeon: Kinsinger, Arta Bruce, MD;  Location: [facility];  Service: General;   Laterality: Right;  . TONSILLECTOMY    . TONSILLECTOMY    . VIDEO BRONCHOSCOPY Bilateral 07/26/2015   Procedure: VIDEO BRONCHOSCOPY WITH FLUORO;  Surgeon: [name], MD;  Location: [facility];  Service: Cardiopulmonary;  Laterality: Bilateral;  . VIDEO BRONCHOSCOPY WITH ENDOBRONCHIAL NAVIGATION N/A 08/23/2015   Procedure: VIDEO BRONCHOSCOPY WITH ENDOBRONCHIAL NAVIGATION;  Surgeon: Grace Isaac, MD;  Location: [facility];  Service: Thoracic;  Laterality: N/A;  . VIDEO BRONCHOSCOPY WITH ENDOBRONCHIAL ULTRASOUND N/A 08/23/2015   Procedure: VIDEO BRONCHOSCOPY WITH ENDOBRONCHIAL ULTRASOUND;  Surgeon: Grace Isaac, MD;  Location: [facility];  Service: Thoracic;  Laterality: N/A;    REVIEW OF SYSTEMS:  A comprehensive review of systems was negative except for: Respiratory: positive for cough and wheezing   PHYSICAL EXAMINATION: General appearance: alert, cooperative and no distress Head: Normocephalic, without obvious abnormality, atraumatic Neck: no adenopathy, no JVD, supple, symmetrical, trachea midline and thyroid not enlarged, symmetric, no tenderness/mass/nodules Lymph nodes: Cervical, supraclavicular, and axillary nodes normal. Resp: wheezes bilaterally Back: symmetric, no curvature. ROM normal. No CVA tenderness. Cardio: regular rate and rhythm, S1, S2 normal, no murmur, click, rub or gallop GI: soft, non-tender; bowel sounds normal; no masses,  no organomegaly Extremities: extremities normal, atraumatic, no cyanosis or edema  ECOG PERFORMANCE STATUS: 1 - Symptomatic but completely ambulatory  Blood pressure 120/80, pulse 65, temperature 98.4 F (36.9 C), temperature source Tympanic, resp. rate 20, height 5\' 9"  (1.753 m), weight 170 lb 4.8 oz (77.2 kg), SpO2 98 %.  LABORATORY DATA: Lab Results  Component Value Date   WBC 8.2 06/20/2020   HGB 14.0 06/20/2020   HCT 42.6 06/20/2020   MCV 93.0 06/20/2020   PLT 229 06/20/2020      Chemistry      Component Value Date/Time    NA 142 06/20/2020 1405   NA 140 06/17/2017 1315   K 4.0 06/20/2020 1405   K 4.5 06/17/2017 1315   CL 104 06/20/2020 1405   CO2 32 06/20/2020 1405   CO2 28 06/17/2017 1315   BUN 16 06/20/2020 1405   BUN 18.1 06/17/2017 1315   CREATININE 1.01 06/20/2020 1405   CREATININE 0.8 06/17/2017 1315      Component Value Date/Time   CALCIUM 9.6 06/20/2020 1405   CALCIUM 9.8 06/17/2017 1315   ALKPHOS 71 06/20/2020 1405   ALKPHOS 82 06/17/2017 1315   AST 10 (L) 06/20/2020 1405   AST 16 06/17/2017 1315   ALT 7 06/20/2020 1405   ALT 10 06/17/2017 1315   BILITOT 0.4 06/20/2020 1405   BILITOT 0.47 06/17/2017 1315       RADIOGRAPHIC STUDIES: CT Chest Wo Contrast  Result Date: 06/21/2020 CLINICAL DATA:  79 year old male with history of non-small cell lung cancer. Staging examination. EXAM: CT CHEST WITHOUT CONTRAST TECHNIQUE: Multidetector CT imaging of the  chest was performed following the standard protocol without IV contrast. COMPARISON:  Chest CT 07/19/2019. FINDINGS: Cardiovascular: Heart size is normal. Small amount of pericardial fluid and/or thickening anteriorly, similar to the prior study, and unlikely to be of hemodynamic significance at this time. No associated pericardial calcification. There is aortic atherosclerosis, as well as atherosclerosis of the great vessels of the mediastinum and the coronary arteries, including calcified atherosclerotic plaque in the left main, left anterior descending, left circumflex and right coronary arteries. 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 Mediastinum/Nodes: No pathologically enlarged mediastinal or hilar lymph nodes. Please note that accurate exclusion of hilar adenopathy is limited on noncontrast CT scans. Large hiatal hernia. No axillary lymphadenopathy. Lungs/Pleura: Chronic masslike architectural distortion in the left upper lobe most notably peripherally (axial image 52 of series 2) measuring up to 3.5 x 1.9 cm, stable compared  to prior studies, most compatible with chronic postradiation masslike fibrosis. Similar soft tissue fullness in the perihilar aspect of the left lung, also similar to the prior examination. Trace left pleural effusion, stable and scratch that is stable compared to the prior examination. Several tiny 2-3 mm pulmonary nodules scattered throughout the lungs bilaterally, nonspecific and stable compared to the prior examination. Mild diffuse bronchial wall thickening with mild centrilobular and paraseptal emphysema. Upper Abdomen: Aortic atherosclerosis. Low-attenuation lesions in the liver, incompletely characterized on today's non-contrast CT examination, but similar in size and number to the prior study and statistically likely to represent cysts. Musculoskeletal: Old fracture of the lateral aspect of the left third rib adjacent to the radiated lesion. There are no aggressive appearing lytic or blastic lesions noted in the visualized portions of the skeleton. IMPRESSION: 1. Chronic postradiation changes in the left lung, stable compared to prior examinations. No findings to suggest local recurrence of disease or definite metastatic disease in the thorax. 2. Aortic atherosclerosis, in addition to left main and 3 vessel coronary artery disease. Assessment for potential risk factor modification, dietary therapy or pharmacologic therapy may be warranted, if clinically indicated. 3. 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 Attention on routine follow-up imaging is recommended to ensure continued stability. 4. Mild diffuse bronchial wall thickening with mild centrilobular and paraseptal emphysema; imaging findings suggestive of underlying COPD. 5. Additional incidental findings, similar to prior studies, as above. Aortic Atherosclerosis (ICD10-I70.0) and Emphysema (ICD10-J43.9). Electronically Signed   By: Vinnie Langton M.D.   On: 06/21/2020 10:18    ASSESSMENT AND PLAN:  This is a very pleasant 79  years old white male with a stage IIB non-small cell lung cancer, squamous cell carcinoma presented with left upper lobe endobronchial lesion in addition to left upper lobe suspicious groundglass opacity. He is status post curative radiotherapy under the care of Dr. Sondra Come. The patient has been on observation since that time and he is feeling fine. He had repeat CT scan of the chest performed recently.  I personally and independently reviewed the scans and discussed the results with the patient today. His scan showed no concerning findings for disease recurrence or metastasis. I recommended for the patient to continue on observation with repeat CT scan of the chest in 1 year. For the wheezing and COPD, I advised him to arrange a follow-up appointment with his pulmonologist for management of this condition. The patient was advised to call immediately if he has any concerning symptoms in the interval. The patient voices understanding of current disease status and treatment options and is in agreement with the  current care plan. All questions were answered. The patient knows to call the clinic with any problems, questions or concerns. We can certainly see the patient much sooner if necessary.  Disclaimer: This note was dictated with voice recognition software. Similar sounding words can inadvertently be transcribed and may not be corrected upon review.

## 2020-06-23 ENCOUNTER — Telehealth: Payer: Self-pay | Admitting: Internal Medicine

## 2020-06-23 NOTE — Telephone Encounter (Signed)
Scheduled per los. Called and left msg. Mailed printout  °

## 2020-06-26 MED FILL — XARELTO 20 MG TABLET: 20 | 30 days supply | Qty: 30 | Fill #8

## 2020-06-26 MED FILL — OMEPRAZOLE 40 MG CPDR: 40 | 30 days supply | Qty: 30 | Fill #3

## 2020-06-26 MED FILL — levETIRAcetam 500 MG TABS: 500 | 30 days supply | Qty: 60 | Fill #2

## 2020-07-11 ENCOUNTER — Telehealth: Payer: Self-pay | Admitting: Family

## 2020-07-11 NOTE — Telephone Encounter (Signed)
CALL BACK # [phone]  Patient wants to know why paperwork states he had a seizure on the 01/26/20 when in reality it happen on the 01/15/20

## 2020-07-12 NOTE — Telephone Encounter (Signed)
Left detail message for patient to call back, he was first assessed on 01-26-20

## 2020-07-20 ENCOUNTER — Telehealth: Payer: Self-pay | Admitting: Family

## 2020-07-20 NOTE — Progress Notes (Signed)
  Chronic Care Management   Outreach Note  07/20/2020 Name: Robert Bates MRN: [id number] DOB: [birth year]  Referred by: [name], NP Reason for referral : No chief complaint on file.   An unsuccessful telephone outreach was attempted today. The patient was referred to the pharmacist for assistance with care management and care coordination.   Follow Up Plan:   Carley Perdue [employer] Scheduler

## 2020-07-21 ENCOUNTER — Telehealth: Payer: Self-pay | Admitting: Family

## 2020-07-21 NOTE — Progress Notes (Signed)
  Chronic Care Management   Outreach Note  07/21/2020 Name: Robert Bates MRN: [id number] DOB: Feb 16, 1941  Referred by: [name], NP Reason for referral : No chief complaint on file.   A second unsuccessful telephone outreach was attempted today. The patient was referred to pharmacist for assistance with care management and care coordination.  Follow Up Plan:   Carley Perdue [employer] Scheduler

## 2020-07-21 NOTE — Progress Notes (Signed)
  Chronic Care Management   Note  07/21/2020 Name: Robert Bates MRN: [id number] DOB: [birth year]  [name] is a 79 y.o. year old male who is a primary care patient of [name], NP. I reached out to Fransisca Connors by phone today in response to a referral sent by [name] PCP, [name], NP.   [name] was given information about Chronic Care Management services today including:  1. CCM service includes personalized support from designated clinical staff supervised by his physician, including individualized plan of care and coordination with other care providers 2. 24/7 contact phone numbers for assistance for urgent and routine care needs. 3. Service will only be billed when office clinical staff spend 20 minutes or more in a month to coordinate care. 4. Only one practitioner may furnish and bill the service in a calendar month. 5. The patient may stop CCM services at any time (effective at the end of the month) by phone call to the office staff.   Patient agreed to services and verbal consent obtained.   Follow up plan:   Carley Perdue [employer] Scheduler

## 2020-07-31 ENCOUNTER — Other Ambulatory Visit: Payer: Self-pay | Admitting: Cardiovascular Disease

## 2020-07-31 ENCOUNTER — Ambulatory Visit: Payer: Medicare Other | Admitting: Podiatry

## 2020-07-31 MED FILL — levETIRAcetam 500 MG TABS: 500 | 30 days supply | Qty: 60 | Fill #3

## 2020-07-31 MED FILL — CARTIA XT 120 MG CP24: 120 | 30 days supply | Qty: 30 | Fill #0

## 2020-07-31 MED FILL — OMEPRAZOLE 40 MG CPDR: 40 | 30 days supply | Qty: 30 | Fill #4

## 2020-07-31 MED FILL — XARELTO 20 MG TABLET: 20 | 30 days supply | Qty: 30 | Fill #9

## 2020-08-04 ENCOUNTER — Ambulatory Visit: Payer: Medicare Other | Admitting: Podiatry

## 2020-08-07 ENCOUNTER — Telehealth: Payer: Self-pay | Admitting: Family

## 2020-08-07 NOTE — Telephone Encounter (Signed)
Caller Croy Drumwright Call Back # [phone]  Patient states is he cleared to return back to work and needs [name] to write a letter for clearance for return back to work. Patient work like release faxed to number below.   [facility]  Fax: [phone]

## 2020-08-08 NOTE — Telephone Encounter (Signed)
Patient is checking on the status of the letter

## 2020-08-08 NOTE — Telephone Encounter (Signed)
See letter.

## 2020-08-08 NOTE — Telephone Encounter (Signed)
Letter faxed to number provided by patient at his request, also copy mailed to his home address.

## 2020-08-18 ENCOUNTER — Ambulatory Visit (INDEPENDENT_AMBULATORY_CARE_PROVIDER_SITE_OTHER): Payer: Medicare Other | Admitting: Podiatry

## 2020-08-18 ENCOUNTER — Other Ambulatory Visit: Payer: Self-pay

## 2020-08-18 DIAGNOSIS — B351 Tinea unguium: Secondary | ICD-10-CM

## 2020-08-18 DIAGNOSIS — E119 Type 2 diabetes mellitus without complications: Secondary | ICD-10-CM | POA: Diagnosis not present

## 2020-08-18 DIAGNOSIS — M79674 Pain in right toe(s): Secondary | ICD-10-CM | POA: Diagnosis not present

## 2020-08-18 DIAGNOSIS — M79675 Pain in left toe(s): Secondary | ICD-10-CM

## 2020-08-18 DIAGNOSIS — L84 Corns and callosities: Secondary | ICD-10-CM

## 2020-08-19 ENCOUNTER — Encounter: Payer: Self-pay | Admitting: Podiatry

## 2020-08-19 NOTE — Progress Notes (Signed)
  Subjective:  Patient ID: Robert Bates, male    DOB: 11/01/1941,  MRN: [id number]  Chief Complaint  Patient presents with  . Nail Problem    pt is here for bil big toenail removal, pt is also here for a possible nail trim.    79 y.o. male presents with the above complaint. History confirmed with patient.  Toenails are thick, incurvated and painful.  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  Weakly palpable pedal pulses.  Delayed capillary fill time.  Venous insufficiency noted.  Some of 5 callus right foot  Assessment:   1. Onychomycosis   2. Pain due to onychomycosis of toenails of both feet   3. Controlled type 2 diabetes mellitus without complication, without long-term current use of insulin ([place])      Plan:  Patient was evaluated and treated and all questions answered.  Discussed the etiology and treatment options for the condition in detail with the patient. Educated patient on the topical and oral treatment options for mycotic nails. Recommended debridement of the nails today. Sharp and mechanical debridement performed of all painful and mycotic nails today. Nails debrided in length and thickness using a nail nipper and a mechanical burr to level of comfort. Discussed treatment options including appropriate shoe gear. Follow up as needed for painful nails.  Procedures performed on the bilateral hallux nails bilateral borders to alleviate the offending border pressure.  I recommend we continue to do this as much possible related to pressure without performing a formal nail avulsion to his diabetes and weakly palpable pulses.  After procedure he tolerated well as and it alleviated all of pressure.  Dispense a dancers pad to alleviate pressure from the callus on the right foot  Return in about 3 months (around 11/18/2020).

## 2020-08-23 ENCOUNTER — Ambulatory Visit: Payer: Medicare Other | Admitting: Adult Health

## 2020-08-23 ENCOUNTER — Other Ambulatory Visit (HOSPITAL_BASED_OUTPATIENT_CLINIC_OR_DEPARTMENT_OTHER): Payer: Self-pay | Admitting: Adult Health

## 2020-08-23 ENCOUNTER — Encounter: Payer: Self-pay | Admitting: Adult Health

## 2020-08-23 VITALS — BP 122/71 | HR 69 | Ht 69.0 in | Wt 175.0 lb

## 2020-08-23 DIAGNOSIS — R569 Unspecified convulsions: Secondary | ICD-10-CM | POA: Diagnosis not present

## 2020-08-23 MED ORDER — LEVETIRACETAM ER 500 MG PO TB24: 1000.0000 mg | ORAL_TABLET | Freq: Every day | ORAL | 5 refills | Status: DC

## 2020-08-23 MED FILL — LEVETIRACETAM ER 500 MG TAB: 500 | 30 days supply | Qty: 60 | Fill #0

## 2020-08-23 NOTE — Progress Notes (Signed)
PATIENT: Detric Scalisi DOB: 1940/12/09  REASON FOR VISIT: follow up HISTORY FROM: patient  HISTORY OF PRESENT ILLNESS: Today 08/23/20: Mr. Cleary is a 79 year old male with a history of seizures.  He returns today for follow-up.  He remains on Keppra 500 mg twice a day.  He denies any seizure events.  Reports that he started driving last month after he had been seizure-free for 6 months.  He states that with the [place] he has noticed more drowsiness throughout the day.  Returns today for an evaluation.  HISTORY Mr. Ruby Cola is a 79 year old right-handed white male with a history of 2 separate meningioma masses in the head, the largest in the right frontal area.  There is some mass-effect on the cortex of the brain.  The patient works as a Administrator, on 14 January 1609 he was at an establishment gambling.  He indicates that he only had a cup of coffee that day, he had not eaten.  He had urinary urgency and sensation of wanting to have a bowel movement but he delayed this.  He is amnestic for the seizure-like event, he remembers waking up with EMS present.  The patient apparently had witnessed jerking of the extremities.  The patient did have urinary incontinence, he did not have bowel incontinence or tongue biting.  He has never had a similar event previously.  He had a mild headache following the episode, but generally he does not have headaches.  He has not had a lot of muscle soreness following the event.  The patient denies any focal numbness or weakness of the face, arms, legs.  He denies any vision problems or speech or swallowing problems.  He denies issues controlling the bowels or the bladder.  He claims he does not drink alcohol and does not use illicit drugs.  The patient has no prior history of a concussion or closed head injury and there is no family history of seizures.  The patient was placed on Keppra and he will was referred to this office.  His potassium level was 3.0 initially in the  ER.   REVIEW OF SYSTEMS: Out of a complete 14 system review of symptoms, the patient complains only of the following symptoms, and all other reviewed systems are negative.  See HPI  ALLERGIES: Allergies  Allergen Reactions  . Iodine Other (See Comments)    neck swells  . Iohexol Swelling    Neck and gland swelling per patient.    HOME MEDICATIONS: Outpatient Medications Prior to Visit  Medication Sig Dispense Refill  . albuterol (PROAIR HFA) 108 (90 Base) MCG/ACT inhaler INHALE 2 PUFFS BY MOUTH INTO THE LUNGS EVERY 6 (SIX) HOURS AS NEEDED FOR WHEEZING OR SHORTNESS OF BREATH. 8.5 g 5  . CARTIA XT 120 MG 24 hr capsule TAKE 1 CAPSULE BY MOUTH ONCE DAILY 30 capsule 0  . levETIRAcetam (KEPPRA) 500 MG tablet Take 1 tablet (500 mg total) by mouth 2 (two) times daily. 180 tablet 1  . omeprazole (PRILOSEC) 40 MG capsule TAKE 1 CAPSULE BY MOUTH DAILY **NEED OFFICE VISIT FOR FURTHER REFILLS** 30 capsule 5  . pravastatin (PRAVACHOL) 80 MG tablet TAKE 1 TABLET (80 MG TOTAL) BY MOUTH AT BEDTIME. 90 tablet 1  . rivaroxaban (XARELTO) 20 MG TABS tablet Take 1 tablet (20 mg total) by mouth daily with supper. 7 tablet 0  . SYMBICORT 160-4.5 MCG/ACT inhaler Inhale 2 puffs into the lungs 2 (two) times daily.     No facility-administered medications prior  to visit.    PAST MEDICAL HISTORY: Past Medical History:  Diagnosis Date  . Alcoholism ([place])    past problem per pt but admits to still drinking - unclear how much  . Anxiety   . Arthritis   . 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  [name] COPD (chronic obstructive pulmonary disease) ([place])   . Diabetes type 2, controlled ([place]) 07/31/2017  . Dysrhythmia    a fib  . GERD (gastroesophageal reflux disease)   . Hematuria    refuses work up or referral - understands risks of morbidity / mortality - 11/2008, 12/2008  . History of hiatal hernia   . History of kidney  stones   . Hyperlipemia   . Meningioma ([place]) 10/25/2013   Follows with Dr. [name].   . Peripheral vascular disease ([place])    Abdominal Aortic Aneursym  . Pneumonia    as a child  . Radiation 09/18/15-10/25/15   left lower lobe 70.2 Gy  . Seizures ([place]) 02/18/2020  . Tobacco abuse     PAST SURGICAL HISTORY: Past Surgical History:  Procedure Laterality Date  . CHOLECYSTECTOMY N/A 07/23/2017   Procedure: LAPAROSCOPIC CHOLECYSTECTOMY;  Surgeon: Kinsinger, Arta Bruce, MD;  Location: [facility];  Service: General;  Laterality: N/A;  . COLONOSCOPY    . EYE SURGERY Bilateral    Cataracts removed w/ lens implant  . HERNIA REPAIR     Left 36 years ago . Right inguinal hernia repair 10-01-17 Dr. Kieth Brightly  . INGUINAL HERNIA REPAIR Right 10/01/2017   Procedure: RIGHT INGUINAL HERNIA REPAIR WITH MESH;  Surgeon: Kinsinger, Arta Bruce, MD;  Location: [facility];  Service: General;  Laterality: Right;  TAP BLOCK  . INSERTION OF MESH Right 10/01/2017   Procedure: INSERTION OF MESH;  Surgeon: Kinsinger, Arta Bruce, MD;  Location: [facility];  Service: General;  Laterality: Right;  . TONSILLECTOMY    . TONSILLECTOMY    . VIDEO BRONCHOSCOPY Bilateral 07/26/2015   Procedure: VIDEO BRONCHOSCOPY WITH FLUORO;  Surgeon: [name], MD;  Location: [facility];  Service: Cardiopulmonary;  Laterality: Bilateral;  . VIDEO BRONCHOSCOPY WITH ENDOBRONCHIAL NAVIGATION N/A 08/23/2015   Procedure: VIDEO BRONCHOSCOPY WITH ENDOBRONCHIAL NAVIGATION;  Surgeon: Grace Isaac, MD;  Location: [facility];  Service: Thoracic;  Laterality: N/A;  . VIDEO BRONCHOSCOPY WITH ENDOBRONCHIAL ULTRASOUND N/A 08/23/2015   Procedure: VIDEO BRONCHOSCOPY WITH ENDOBRONCHIAL ULTRASOUND;  Surgeon: Grace Isaac, MD;  Location: [facility];  Service: Thoracic;  Laterality: N/A;    FAMILY HISTORY: Family History  Problem Relation Age of Onset  . Leukemia Father   . Emphysema Father   . Learning disabilities Son   . Leukemia Other   . Stroke  Other     SOCIAL HISTORY: Social History   Socioeconomic History  . Marital status: Married    Spouse name: Not on file  . Number of children: 2  . Years of education: Not on file  . Highest education level: Not on file  Occupational History  . 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 Smoking status: Former Smoker    Packs/day: 1.00    Years: 57.00    Pack years: 57.00    Quit date: 08/08/2015    Years since quitting: 5.0  . Smokeless tobacco: Former Systems developer    Types: Loss adjuster, chartered  Quit date: 11/04/1958  . Tobacco comment: occasional use sneaks around  Vaping Use  . Vaping Use: Every day  Substance and Sexual Activity  . Alcohol use: Yes    Alcohol/week: 0.0 standard drinks    Comment: Pt reports rare use hx of denies 10-01-17  . Drug use: No  . Sexual activity: Yes  Other Topics Concern  . Not on file  Social History Narrative  . Not on file   Social Determinants of Health   Financial Resource Strain:   . Difficulty of Paying Living Expenses: Not on file  Food Insecurity:   . Worried About Charity fundraiser in the Last Year: Not on file  . Ran Out of Food in the Last Year: Not on file  Transportation Needs:   . Lack of Transportation (Medical): Not on file  . Lack of Transportation (Non-Medical): Not on file  Physical Activity:   . Days of Exercise per Week: Not on file  . Minutes of Exercise per Session: Not on file  Stress:   . Feeling of Stress : Not on file  Social Connections:   . Frequency of Communication with Friends and Family: Not on file  . Frequency of Social Gatherings with Friends and Family: Not on file  . Attends Religious Services: Not on file  . Active Member of Clubs or Organizations: Not on file  . Attends Archivist Meetings: Not on file  . Marital Status: Not on file  Intimate Partner Violence:   . Fear of Current or Ex-Partner: Not on file  . Emotionally  Abused: Not on file  . Physically Abused: Not on file  . Sexually Abused: Not on file      PHYSICAL EXAM  Vitals:   08/23/20 0917  BP: 122/71  Pulse: 69  Weight: 175 lb (79.4 kg)  Height: 5\' 9"  (1.753 m)   Body mass index is 25.84 kg/m.  Generalized: Well developed, in no acute distress   Neurological examination  Mentation: Alert oriented to time, place, history taking. Follows all commands speech and language fluent Cranial nerve II-XII: Pupils were equal round reactive to light. Extraocular movements were full, visual field were full on confrontational test. Head turning and shoulder shrug  were normal and symmetric. Motor: The motor testing reveals 5 over 5 strength of all 4 extremities. Good symmetric motor tone is noted throughout.  Sensory: Sensory testing is intact to soft touch on all 4 extremities. No evidence of extinction is noted.  Coordination: Cerebellar testing reveals good finger-nose-finger and heel-to-shin bilaterally.  Gait and station: Gait is normal.  Reflexes: Deep tendon reflexes are symmetric and normal bilaterally.   DIAGNOSTIC DATA (LABS, IMAGING, TESTING) - I reviewed patient records, labs, notes, testing and imaging myself where available.  Lab Results  Component Value Date   WBC 8.2 06/20/2020   HGB 14.0 06/20/2020   HCT 42.6 06/20/2020   MCV 93.0 06/20/2020   PLT 229 06/20/2020      Component Value Date/Time   NA 142 06/20/2020 1405   NA 140 06/17/2017 1315   K 4.0 06/20/2020 1405   K 4.5 06/17/2017 1315   CL 104 06/20/2020 1405   CO2 32 06/20/2020 1405   CO2 28 06/17/2017 1315   GLUCOSE 148 (H) 06/20/2020 1405   GLUCOSE 95 06/17/2017 1315   BUN 16 06/20/2020 1405   BUN 18.1 06/17/2017 1315   CREATININE 1.01 06/20/2020 1405   CREATININE 0.8 06/17/2017 1315   CALCIUM 9.6 06/20/2020 1405  CALCIUM 9.8 06/17/2017 1315   PROT 5.9 (L) 06/20/2020 1405   PROT 6.4 06/17/2017 1315   ALBUMIN 4.0 06/20/2020 1405   ALBUMIN 3.3 (L)  06/17/2017 1315   AST 10 (L) 06/20/2020 1405   AST 16 06/17/2017 1315   ALT 7 06/20/2020 1405   ALT 10 06/17/2017 1315   ALKPHOS 71 06/20/2020 1405   ALKPHOS 82 06/17/2017 1315   BILITOT 0.4 06/20/2020 1405   BILITOT 0.47 06/17/2017 1315   GFRNONAA >60 06/20/2020 1405   GFRAA >60 06/20/2020 1405   Lab Results  Component Value Date   CHOL 157 03/09/2020   HDL 50.10 03/09/2020   LDLCALC 76 03/09/2020   LDLDIRECT 142.2 12/26/2008   TRIG 154.0 (H) 03/09/2020   CHOLHDL 3 03/09/2020   Lab Results  Component Value Date   HGBA1C 5.9 03/09/2020   No results found for: VITAMINB12 Lab Results  Component Value Date   TSH 1.31 11/08/2019      ASSESSMENT AND PLAN 79 y.o. year old male  has a past medical history of Alcoholism ([place]), Anxiety, Arthritis, Cancer ([place]) (2016), COPD (chronic obstructive pulmonary disease) ([place]), Diabetes type 2, controlled ([place]) (07/31/2017), Dysrhythmia, GERD (gastroesophageal reflux disease), Hematuria, History of hiatal hernia, History of kidney stones, Hyperlipemia, Meningioma ([place]) (10/25/2013), Peripheral vascular disease ([place]), Pneumonia, Radiation (09/18/15-10/25/15), Seizures ([place]) (02/18/2020), and Tobacco abuse. here with:  1.  Seizures  -We will switch to Keppra extended release at 1000 mg daily due to daytime sleepiness with immediate release dosing -Follow-up in 6 months or sooner if needed   I spent 25 minutes of face-to-face and non-face-to-face time with patient.  This included previsit chart review, lab review, study review, order entry, electronic health record documentation, patient education.  Ward Givens, MSN, NP-C 08/23/2020, 9:10 AM [facility] [address] [place], [place] [address] [phone]

## 2020-08-23 NOTE — Patient Instructions (Signed)
Your Plan:  Start Keppra ER 500 mg- 2 tablets daily If your symptoms worsen or you develop new symptoms please let us know.   Thank you for coming to see [place] at [facility]. I hope we have been able to provide you high quality care today.  You may receive a patient satisfaction survey over the next few weeks. We would appreciate your feedback and comments so that we may continue to improve ourselves and the health of our patients.

## 2020-08-24 NOTE — Progress Notes (Signed)
I have read the note, and I agree with the clinical assessment and plan.  Robert Bates [name]   

## 2020-08-28 ENCOUNTER — Other Ambulatory Visit: Payer: Self-pay | Admitting: Family

## 2020-08-28 ENCOUNTER — Other Ambulatory Visit (HOSPITAL_BASED_OUTPATIENT_CLINIC_OR_DEPARTMENT_OTHER): Payer: Self-pay | Admitting: Physician Assistant

## 2020-08-28 ENCOUNTER — Other Ambulatory Visit: Payer: Self-pay | Admitting: Physician Assistant

## 2020-08-28 MED FILL — OMEPRAZOLE 40 MG CPDR: 40 | 30 days supply | Qty: 30 | Fill #5

## 2020-08-28 MED FILL — PRAVASTATIN SODIUM 80 MG TA: 80 | 90 days supply | Qty: 90 | Fill #0

## 2020-08-28 MED FILL — CARTIA XT 120 MG CP24: 120 | 30 days supply | Qty: 30 | Fill #0

## 2020-08-28 MED FILL — XARELTO 20 MG TABLET: 20 | 30 days supply | Qty: 30 | Fill #0

## 2020-08-28 NOTE — Telephone Encounter (Signed)
Age 79, weight 79kg, SCr 1.01 on 06/20/20, CrCl 7mL/min Has follow up scheduled next week, afib indication

## 2020-09-04 ENCOUNTER — Ambulatory Visit: Payer: Medicare Other | Attending: Internal Medicine

## 2020-09-04 ENCOUNTER — Other Ambulatory Visit (HOSPITAL_BASED_OUTPATIENT_CLINIC_OR_DEPARTMENT_OTHER): Payer: Self-pay | Admitting: Internal Medicine

## 2020-09-04 ENCOUNTER — Other Ambulatory Visit: Payer: Self-pay

## 2020-09-04 ENCOUNTER — Ambulatory Visit (INDEPENDENT_AMBULATORY_CARE_PROVIDER_SITE_OTHER): Payer: Medicare Other | Admitting: Family

## 2020-09-04 DIAGNOSIS — G8929 Other chronic pain: Secondary | ICD-10-CM

## 2020-09-04 DIAGNOSIS — M545 Low back pain, unspecified: Secondary | ICD-10-CM | POA: Diagnosis not present

## 2020-09-04 DIAGNOSIS — E785 Hyperlipidemia, unspecified: Secondary | ICD-10-CM

## 2020-09-04 DIAGNOSIS — J029 Acute pharyngitis, unspecified: Secondary | ICD-10-CM | POA: Diagnosis not present

## 2020-09-04 DIAGNOSIS — J449 Chronic obstructive pulmonary disease, unspecified: Secondary | ICD-10-CM

## 2020-09-04 DIAGNOSIS — Z23 Encounter for immunization: Secondary | ICD-10-CM

## 2020-09-04 DIAGNOSIS — E119 Type 2 diabetes mellitus without complications: Secondary | ICD-10-CM

## 2020-09-04 DIAGNOSIS — J020 Streptococcal pharyngitis: Secondary | ICD-10-CM

## 2020-09-04 DIAGNOSIS — R569 Unspecified convulsions: Secondary | ICD-10-CM | POA: Diagnosis not present

## 2020-09-04 LAB — POCT RAPID STREP A (OFFICE): Rapid Strep A Screen: POSITIVE — AB

## 2020-09-04 NOTE — Patient Instructions (Signed)
For your back pain- please do the exercises below twice daily.  You may use tylenol 1000mg  twice daily.   Back Exercises The following exercises strengthen the muscles that help to support the trunk and back. They also help to keep the lower back flexible. Doing these exercises can help to prevent back pain or lessen existing pain.  If you have back pain or discomfort, try doing these exercises 2-3 times each day or as told by your health care provider.  As your pain improves, do them once each day, but increase the number of times that you repeat the steps for each exercise (do more repetitions).  To prevent the recurrence of back pain, continue to do these exercises once each day or as told by your health care provider. Do exercises exactly as told by your health care provider and adjust them as directed. It is normal to feel mild stretching, pulling, tightness, or discomfort as you do these exercises, but you should stop right away if you feel sudden pain or your pain gets worse. Exercises Single knee to chest Repeat these steps 3-5 times for each leg: 1. Lie on your back on a firm bed or the floor with your legs extended. 2. Bring one knee to your chest. Your other leg should stay extended and in contact with the floor. 3. Hold your knee in place by grabbing your knee or thigh with both hands and hold. 4. Pull on your knee until you feel a gentle stretch in your lower back or buttocks. 5. Hold the stretch for 10-30 seconds. 6. Slowly release and straighten your leg. Pelvic tilt Repeat these steps 5-10 times: 1. Lie on your back on a firm bed or the floor with your legs extended. 2. Bend your knees so they are pointing toward the ceiling and your feet are flat on the floor. 3. Tighten your lower abdominal muscles to press your lower back against the floor. This motion will tilt your pelvis so your tailbone points up toward the ceiling instead of pointing to your feet or the  floor. 4. With gentle tension and even breathing, hold this position for 5-10 seconds. Cat-cow Repeat these steps until your lower back becomes more flexible: 1. Get into a hands-and-knees position on a firm surface. Keep your hands under your shoulders, and keep your knees under your hips. You may place padding under your knees for comfort. 2. Let your head hang down toward your chest. Contract your abdominal muscles and point your tailbone toward the floor so your lower back becomes rounded like the back of a cat. 3. Hold this position for 5 seconds. 4. Slowly lift your head, let your abdominal muscles relax and point your tailbone up toward the ceiling so your back forms a sagging arch like the back of a cow. 5. Hold this position for 5 seconds.  Press-ups Repeat these steps 5-10 times: 1. Lie on your abdomen (face-down) on the floor. 2. Place your palms near your head, about shoulder-width apart. 3. Keeping your back as relaxed as possible and keeping your hips on the floor, slowly straighten your arms to raise the top half of your body and lift your shoulders. Do not use your back muscles to raise your upper torso. You may adjust the placement of your hands to make yourself more comfortable. 4. Hold this position for 5 seconds while you keep your back relaxed. 5. Slowly return to lying flat on the floor.  Bridges Repeat these steps 10 times:  1. Lie on your back on a firm surface. 2. Bend your knees so they are pointing toward the ceiling and your feet are flat on the floor. Your arms should be flat at your sides, next to your body. 3. Tighten your buttocks muscles and lift your buttocks off the floor until your waist is at almost the same height as your knees. You should feel the muscles working in your buttocks and the back of your thighs. If you do not feel these muscles, slide your feet 1-2 inches farther away from your buttocks. 4. Hold this position for 3-5 seconds. 5. Slowly lower  your hips to the starting position, and allow your buttocks muscles to relax completely. If this exercise is too easy, try doing it with your arms crossed over your chest. Abdominal crunches Repeat these steps 5-10 times: 1. Lie on your back on a firm bed or the floor with your legs extended. 2. Bend your knees so they are pointing toward the ceiling and your feet are flat on the floor. 3. Cross your arms over your chest. 4. Tip your chin slightly toward your chest without bending your neck. 5. Tighten your abdominal muscles and slowly raise your trunk (torso) high enough to lift your shoulder blades a tiny bit off the floor. Avoid raising your torso higher than that because it can put too much stress on your low back and does not help to strengthen your abdominal muscles. 6. Slowly return to your starting position. Back lifts Repeat these steps 5-10 times: 1. Lie on your abdomen (face-down) with your arms at your sides, and rest your forehead on the floor. 2. Tighten the muscles in your legs and your buttocks. 3. Slowly lift your chest off the floor while you keep your hips pressed to the floor. Keep the back of your head in line with the curve in your back. Your eyes should be looking at the floor. 4. Hold this position for 3-5 seconds. 5. Slowly return to your starting position. Contact a health care provider if:  Your back pain or discomfort gets much worse when you do an exercise.  Your worsening back pain or discomfort does not lessen within 2 hours after you exercise. If you have any of these problems, stop doing these exercises right away. Do not do them again unless your health care provider says that you can. Get help right away if:  You develop sudden, severe back pain. If this happens, stop doing the exercises right away. Do not do them again unless your health care provider says that you can. This information is not intended to replace advice given to you by your health care  provider. Make sure you discuss any questions you have with your health care provider. Document Revised: 02/25/2019 Document Reviewed: 07/23/2018 Elsevier Patient Education  [place].

## 2020-09-04 NOTE — Progress Notes (Signed)
° °  Covid-19 Vaccination Clinic  Name:  Robert Bates    MRN: [id number] DOB: 08-24-1941  09/04/2020  [name] was observed post Covid-19 immunization for 15 minutes without incident. He was provided with Vaccine Information Sheet and instruction to access the V-Safe system.   [name] was instructed to call 911 with any severe reactions post vaccine:  Difficulty breathing   Swelling of face and throat   A fast heartbeat   A bad rash all over body   Dizziness and weakness

## 2020-09-04 NOTE — Progress Notes (Signed)
p 

## 2020-09-04 NOTE — Progress Notes (Signed)
Subjective:    Patient ID: Robert Bates, male    DOB: Sep 26, 1941, 79 y.o.   MRN: [id number]  HPI  Patient is a 79 yr old male who presents today for follow up.  DM2-  Lab Results  Component Value Date   HGBA1C 5.9 03/09/2020   HGBA1C 6.2 11/20/2018   HGBA1C 6.1 12/25/2017   Lab Results  Component Value Date   MICROALBUR 1.0 06/01/2020   LDLCALC 76 03/09/2020   CREATININE 1.01 06/20/2020   Back pain-  He had a CT of his lumbar spine performed back in 12/20.  There was noted mild multilevel disc bulging and spinal stenosis.    Hx of seizure- on keppra. Denies any recent seizure activity. Following with neurology.   BPH- maintained on flomax.  AF- continues xarelto.  GERD- maintained on prilosec.  COPD- reports that he has been wheezing lately.    Also c/o sore throat.   Review of Systems    see HPI  Past Medical History:  Diagnosis Date  . Alcoholism ([place])    past problem per pt but admits to still drinking - unclear how much  . Anxiety   . Arthritis   . 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  [name] COPD (chronic obstructive pulmonary disease) ([place])   . Diabetes type 2, controlled ([place]) 07/31/2017  . Dysrhythmia    a fib  . GERD (gastroesophageal reflux disease)   . Hematuria    refuses work up or referral - understands risks of morbidity / mortality - 11/2008, 12/2008  . History of hiatal hernia   . History of kidney stones   . Hyperlipemia   . Meningioma ([place]) 10/25/2013   Follows with Dr. [name].   . Peripheral vascular disease ([place])    Abdominal Aortic Aneursym  . Pneumonia    as a child  . Radiation 09/18/15-10/25/15   left lower lobe 70.2 Gy  . Seizures ([place]) 02/18/2020  . Tobacco abuse      Social History   Socioeconomic History  . Marital status: Married    Spouse name: Not on file  . Number of children: 2  . Years of education: Not on file  .  Highest education level: Not on file  Occupational History  . 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 Smoking status: Former Smoker    Packs/day: 1.00    Years: 57.00    Pack years: 57.00    Quit date: 08/08/2015    Years since quitting: 5.0  . Smokeless tobacco: Former Systems developer    Types: Chew    Quit date: 11/04/1958  . Tobacco comment: occasional use sneaks around  Vaping Use  . Vaping Use: Every day  Substance and Sexual Activity  . Alcohol use: Yes    Alcohol/week: 0.0 standard drinks    Comment: Pt reports rare use hx of denies 10-01-17  . Drug use: No  . Sexual activity: Yes  Other Topics Concern  . Not on file  Social History Narrative  . Not on file   Social Determinants of Health   Financial Resource Strain:   . Difficulty of Paying Living Expenses: Not on file  Food Insecurity:   . Worried About Charity fundraiser in the Last Year: Not on file  . Ran Out of  Food in the Last Year: Not on file  Transportation Needs:   . Lack of Transportation (Medical): Not on file  . Lack of Transportation (Non-Medical): Not on file  Physical Activity:   . Days of Exercise per Week: Not on file  . Minutes of Exercise per Session: Not on file  Stress:   . Feeling of Stress : Not on file  Social Connections:   . Frequency of Communication with Friends and Family: Not on file  . Frequency of Social Gatherings with Friends and Family: Not on file  . Attends Religious Services: Not on file  . Active Member of Clubs or Organizations: Not on file  . Attends Archivist Meetings: Not on file  . Marital Status: Not on file  Intimate Partner Violence:   . Fear of Current or Ex-Partner: Not on file  . Emotionally Abused: Not on file  . Physically Abused: Not on file  . Sexually Abused: Not on file    Past Surgical History:  Procedure Laterality Date  . CHOLECYSTECTOMY N/A 07/23/2017   Procedure:  LAPAROSCOPIC CHOLECYSTECTOMY;  Surgeon: Kinsinger, Arta Bruce, MD;  [facility]: [facility];  Service: General;  Laterality: N/A;  . COLONOSCOPY    . EYE SURGERY Bilateral    Cataracts removed w/ lens implant  . HERNIA REPAIR     Left 36 years ago . Right inguinal hernia repair 10-01-17 Dr. Kieth Brightly  . INGUINAL HERNIA REPAIR Right 10/01/2017   Procedure: RIGHT INGUINAL HERNIA REPAIR WITH MESH;  Surgeon: Kinsinger, Arta Bruce, MD;  [facility];  Service: General;  Laterality: Right;  TAP BLOCK  . INSERTION OF MESH Right 10/01/2017   Procedure: INSERTION OF MESH;  Surgeon: Kinsinger, Arta Bruce, MD;  [facility];  Service: General;  Laterality: Right;  . TONSILLECTOMY    . TONSILLECTOMY    . VIDEO BRONCHOSCOPY Bilateral 07/26/2015   Procedure: VIDEO BRONCHOSCOPY WITH FLUORO;  Surgeon: [name], MD;  [facility];  Service: Cardiopulmonary;  Laterality: Bilateral;  . VIDEO BRONCHOSCOPY WITH ENDOBRONCHIAL NAVIGATION N/A 08/23/2015   Procedure: VIDEO BRONCHOSCOPY WITH ENDOBRONCHIAL NAVIGATION;  Surgeon: Grace Isaac, MD;  Location: [facility];  Service: Thoracic;  Laterality: N/A;  . VIDEO BRONCHOSCOPY WITH ENDOBRONCHIAL ULTRASOUND N/A 08/23/2015   Procedure: VIDEO BRONCHOSCOPY WITH ENDOBRONCHIAL ULTRASOUND;  Surgeon: Grace Isaac, MD;  [facility];  Service: Thoracic;  Laterality: N/A;    Family History  Problem Relation Age of Onset  . Leukemia Father   . Emphysema Father   . Learning disabilities Son   . Leukemia Other   . Stroke Other     Allergies  Allergen Reactions  . Iodine Other (See Comments)    neck swells  . Iohexol Swelling    Neck and gland swelling per patient.    Current Outpatient Medications on File Prior to Visit  Medication Sig Dispense Refill  . albuterol (PROAIR HFA) 108 (90 Base) MCG/ACT inhaler INHALE 2 PUFFS BY MOUTH INTO THE LUNGS EVERY 6 (SIX) HOURS AS NEEDED FOR WHEEZING OR SHORTNESS OF BREATH. 8.5 g 5  . diltiazem (CARTIA XT)  120 MG 24 hr capsule Take 1 capsule (120 mg total) by mouth daily. Please keep upcoming appt in November with Dr. Acie Fredrickson for future refills. Thank you 30 capsule 0  . levETIRAcetam (KEPPRA XR) 500 MG 24 hr tablet Take 2 tablets (1,000 mg total) by mouth daily. 60 tablet 5  . omeprazole (PRILOSEC) 40 MG capsule TAKE 1 CAPSULE BY MOUTH DAILY **  NEED OFFICE VISIT FOR FURTHER REFILLS** 30 capsule 5  . pravastatin (PRAVACHOL) 80 MG tablet TAKE 1 TABLET (80 MG TOTAL) BY MOUTH AT BEDTIME. 90 tablet 1  . SYMBICORT 160-4.5 MCG/ACT inhaler INHALE 2 PUFFS INTO THE LUNGS 2 (TWO) TIMES DAILY. 10.2 g 5  . XARELTO 20 MG TABS tablet TAKE 1 TABLET (20 MG TOTAL) BY MOUTH DAILY WITH SUPPER. 30 tablet 5   No current facility-administered medications on file prior to visit.    BP 130/75 (BP Location: Right Arm, Patient Position: Sitting, Cuff Size: Small)   Pulse 65   Temp 98.1 F (36.7 C) (Oral)   Resp 16   Wt 172 lb (78 kg)   SpO2 99%   BMI 25.40 kg/m    Objective:   Physical Exam Constitutional:      General: He is not in acute distress.    Appearance: He is well-developed.  HENT:     Head: Normocephalic and atraumatic.  Cardiovascular:     Rate and Rhythm: Normal rate and regular rhythm.     Heart sounds: No murmur heard.   Pulmonary:     Effort: Pulmonary effort is normal. No respiratory distress.     Breath sounds: Normal breath sounds. No wheezing or rales.  Skin:    General: Skin is warm and dry.  Neurological:     Mental Status: He is alert and oriented to person, place, and time.  Psychiatric:        Behavior: Behavior normal.        Thought Content: Thought content normal.           Assessment & Plan:  Low back pain- offered referral to PT.  Pt declines.  Recommended tylenol prn. Pt given some exercises to do at home.  History of seizure- stable on keppra.  Followed by neurology.   Strep throat- rapid strep +.  Will rx with amoxicillin.   Hyperlipidemia-maintained on  pravastatin 80mg .    COPD- reports symptoms are stable on symbicort.   PAF- rate stable. Followed by cardiology. On Xarelto.   Back pain- offered referral to PT.  He declines as he does not think he has time.  Gave him back exercises to try at home. Advised tylenol 1000mg  bid as needed for pain. 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  Need to avoid NSAIDS due to anticoagulation.  DM2- obtain A1C, bmet.   This visit occurred during the SARS-CoV-2 public health emergency.  Safety protocols were in place, including screening questions prior to the visit, additional usage of staff PPE, and extensive cleaning of exam room while observing appropriate contact time as indicated for disinfecting solutions.

## 2020-09-05 ENCOUNTER — Other Ambulatory Visit (HOSPITAL_BASED_OUTPATIENT_CLINIC_OR_DEPARTMENT_OTHER): Payer: Self-pay | Admitting: Family

## 2020-09-05 MED ORDER — AMOXICILLIN 500 MG PO CAPS: 500.0000 mg | ORAL_CAPSULE | Freq: Three times a day (TID) | ORAL | 0 refills | Status: DC

## 2020-09-05 MED FILL — AMOXICILLIN 500 MG CAPSULE: 500 | 10 days supply | Qty: 30 | Fill #0

## 2020-09-07 ENCOUNTER — Encounter: Payer: Self-pay | Admitting: Cardiovascular Disease

## 2020-09-07 NOTE — Progress Notes (Signed)
Cardiology Office Note:    Date:  09/08/2020   ID:  Robert Bates, DOB Nov 11, 1940, MRN [id number]  PCP:  [name], NP  Cardiologist:  Mertie Moores, MD    Referring MD: [name], NP   Chief Complaint  Patient presents with  . Atrial Fibrillation    Previous notes:     Robert Bates is a 79 y.o. male with a hx of  PAF. He has documented PAF on monitor Cannot tell that he is in AF  I met him in the hospital  He has a history of tobacco abuse, alcoholism, lung cancer, COPD, peripheral vascular disease, abdominal aortic aneurysm, hypertension   Nov, 5, 2021: [name] is seen today for follow of his PAF, tobacco abuse, alcoholism, lung CA, COPD, AAA HTN Still working , truck driving   On xarelto 20 mg a day  As a thoracic aneurims followed by dr. Inda Merlin    Past Medical History:  Diagnosis Date  . Alcoholism ([place])    past problem per pt but admits to still drinking - unclear how much  . Anxiety   . Arthritis   . 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  [name] COPD (chronic obstructive pulmonary disease) ([place])   . Diabetes type 2, controlled ([place]) 07/31/2017  . Dysrhythmia    a fib  . GERD (gastroesophageal reflux disease)   . Hematuria    refuses work up or referral - understands risks of morbidity / mortality - 11/2008, 12/2008  . History of hiatal hernia   . History of kidney stones   . Hyperlipemia   . Meningioma ([place]) 10/25/2013   Follows with Dr. [name].   . Peripheral vascular disease ([place])    Abdominal Aortic Aneursym  . Pneumonia    as a child  . Radiation 09/18/15-10/25/15   left lower lobe 70.2 Gy  . Seizures ([place]) 02/18/2020  . Tobacco abuse     Past Surgical History:  Procedure Laterality Date  . CHOLECYSTECTOMY N/A 07/23/2017   Procedure: LAPAROSCOPIC CHOLECYSTECTOMY;  Surgeon: Kinsinger, Arta Bruce, MD;  [facility];  Service: General;   Laterality: N/A;  . COLONOSCOPY    . EYE SURGERY Bilateral    Cataracts removed w/ lens implant  . HERNIA REPAIR     Left 36 years ago . Right inguinal hernia repair 10-01-17 Dr. Kieth Brightly  . INGUINAL HERNIA REPAIR Right 10/01/2017   Procedure: RIGHT INGUINAL HERNIA REPAIR WITH MESH;  Surgeon: Kinsinger, Arta Bruce, MD;  [facility];  Service: General;  Laterality: Right;  TAP BLOCK  . INSERTION OF MESH Right 10/01/2017   Procedure: INSERTION OF MESH;  Surgeon: Kinsinger, Arta Bruce, MD;  [facility];  Service: General;  Laterality: Right;  . TONSILLECTOMY    . TONSILLECTOMY    . VIDEO BRONCHOSCOPY Bilateral 07/26/2015   Procedure: VIDEO BRONCHOSCOPY WITH FLUORO;  Surgeon: [name], MD;  [facility];  Service: Cardiopulmonary;  Laterality: Bilateral;  . VIDEO BRONCHOSCOPY WITH ENDOBRONCHIAL NAVIGATION N/A 08/23/2015   Procedure: VIDEO BRONCHOSCOPY WITH ENDOBRONCHIAL NAVIGATION;  Surgeon: Grace Isaac, MD;  Location: [facility];  Service: Thoracic;  Laterality: N/A;  . VIDEO BRONCHOSCOPY WITH ENDOBRONCHIAL ULTRASOUND N/A 08/23/2015   Procedure: VIDEO BRONCHOSCOPY WITH ENDOBRONCHIAL ULTRASOUND;  Surgeon: Grace Isaac, MD;  Location: [facility];  Service: Thoracic;  Laterality: N/A;    Current Medications: Current Meds  Medication Sig  .  albuterol (PROAIR HFA) 108 (90 Base) MCG/ACT inhaler INHALE 2 PUFFS BY MOUTH INTO THE LUNGS EVERY 6 (SIX) HOURS AS NEEDED FOR WHEEZING OR SHORTNESS OF BREATH.  [name] amoxicillin (AMOXIL) 500 MG capsule Take 1 capsule (500 mg total) by mouth 3 (three) times daily.  [name] diltiazem (CARTIA XT) 120 MG 24 hr capsule Take 1 capsule (120 mg total) by mouth daily.  [name] levETIRAcetam (KEPPRA XR) 500 MG 24 hr tablet Take 2 tablets (1,000 mg total) by mouth daily.  [name] omeprazole (PRILOSEC) 40 MG capsule TAKE 1 CAPSULE BY MOUTH DAILY **NEED OFFICE VISIT FOR FURTHER REFILLS**  . pravastatin (PRAVACHOL) 80 MG tablet TAKE 1 TABLET (80 MG TOTAL) BY MOUTH AT  BEDTIME.  . SYMBICORT 160-4.5 MCG/ACT inhaler INHALE 2 PUFFS INTO THE LUNGS 2 (TWO) TIMES DAILY.  [name] XARELTO 20 MG TABS tablet TAKE 1 TABLET (20 MG TOTAL) BY MOUTH DAILY WITH SUPPER.  . [DISCONTINUED] diltiazem (CARTIA XT) 120 MG 24 hr capsule Take 1 capsule (120 mg total) by mouth daily. Please keep upcoming appt in November with Dr. Acie Fredrickson for future refills. Thank you     Allergies:   Iodine and Iohexol   Social History   Socioeconomic History  . Marital status: Married    Spouse name: Not on file  . Number of children: 2  . Years of education: Not on file  . Highest education level: Not on file  Occupational History  . 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 Smoking status: Former Smoker    Packs/day: 1.00    Years: 57.00    Pack years: 57.00    Quit date: 08/08/2015    Years since quitting: 5.0  . Smokeless tobacco: Former Systems developer    Types: Chew    Quit date: 11/04/1958  . Tobacco comment: occasional use sneaks around  Vaping Use  . Vaping Use: Every day  Substance and Sexual Activity  . Alcohol use: Yes    Alcohol/week: 0.0 standard drinks    Comment: Pt reports rare use hx of denies 10-01-17  . Drug use: No  . Sexual activity: Yes  Other Topics Concern  . Not on file  Social History Narrative  . Not on file   Social Determinants of Health   Financial Resource Strain:   . Difficulty of Paying Living Expenses: Not on file  Food Insecurity:   . Worried About Charity fundraiser in the Last Year: Not on file  . Ran Out of Food in the Last Year: Not on file  Transportation Needs:   . Lack of Transportation (Medical): Not on file  . Lack of Transportation (Non-Medical): Not on file  Physical Activity:   . Days of Exercise per Week: Not on file  . Minutes of Exercise per Session: Not on file  Stress:   . Feeling of Stress : Not on file  Social Connections:   . Frequency of Communication with  Friends and Family: Not on file  . Frequency of Social Gatherings with Friends and Family: Not on file  . Attends Religious Services: Not on file  . Active Member of Clubs or Organizations: Not on file  . Attends Archivist Meetings: Not on file  . Marital Status: Not on file     Family History: The patient's family history includes Emphysema in his father; Learning disabilities in his son; Leukemia in his father and another  family member; Stroke in an other family member. ROS:   Please see the history of present illness.     All other systems reviewed and are negative.  EKGs/Labs/Other Studies Reviewed:    The following studies were reviewed today:   EKG:   Nov. 5, 2021:   NSR , occas PAC   Recent Labs: 11/08/2019: TSH 1.31 06/20/2020: ALT 7; BUN 16; Creatinine 1.01; Hemoglobin 14.0; Platelet Count 229; Potassium 4.0; Sodium 142  Recent Lipid Panel    Component Value Date/Time   CHOL 157 03/09/2020 1429   TRIG 154.0 (H) 03/09/2020 1429   HDL 50.10 03/09/2020 1429   CHOLHDL 3 03/09/2020 1429   VLDL 30.8 03/09/2020 1429   LDLCALC 76 03/09/2020 1429   LDLDIRECT 142.2 12/26/2008 1032    Physical Exam:    Physical Exam: Blood pressure 108/66, pulse 72, height $RemoveBe'5\' 9"'IqJjFSHGe$  (1.753 m), weight 172 lb 12.8 oz (78.4 kg), SpO2 97 %.  GEN:  Well nourished, well developed in no acute distress HEENT: Normal NECK: No JVD; No carotid bruits LYMPHATICS: No lymphadenopathy CARDIAC: RRR , no murmurs, rubs, gallops RESPIRATORY:  Clear to auscultation without rales, wheezing or rhonchi  ABDOMEN: Soft, non-tender, non-distended MUSCULOSKELETAL:  No edema; No deformity  SKIN: Warm and dry NEUROLOGIC:  Alert and oriented x 3   ASSESSMENT:    1. Essential hypertension   2. PAF (paroxysmal atrial fibrillation) ([place])   3. Chronic diastolic heart failure (HCC)    PLAN:      1.  Paroxysmal atrial fibrillation:  Is in NSR today . Cont xarelto   2. Hyperlipidemia:  Managed by  primary md  3. Lung cancer:  Followed by oncology     Medication Adjustments/Labs and Tests Ordered: Current medicines are reviewed at length with the patient today.  Concerns regarding medicines are outlined above.  Orders Placed This Encounter  Procedures  . EKG 12-Lead   Meds ordered this encounter  Medications  . diltiazem (CARTIA XT) 120 MG 24 hr capsule    Sig: Take 1 capsule (120 mg total) by mouth daily.    Dispense:  90 capsule    Refill:  3    Signed, Mertie Moores, MD  09/08/2020 6:05 PM    [place]

## 2020-09-08 ENCOUNTER — Other Ambulatory Visit (HOSPITAL_BASED_OUTPATIENT_CLINIC_OR_DEPARTMENT_OTHER): Payer: Self-pay | Admitting: Cardiovascular Disease

## 2020-09-08 ENCOUNTER — Other Ambulatory Visit: Payer: Self-pay

## 2020-09-08 ENCOUNTER — Ambulatory Visit: Payer: Medicare Other | Admitting: Cardiovascular Disease

## 2020-09-08 ENCOUNTER — Encounter: Payer: Self-pay | Admitting: Cardiovascular Disease

## 2020-09-08 VITALS — BP 108/66 | HR 72 | Ht 69.0 in | Wt 172.8 lb

## 2020-09-08 DIAGNOSIS — I480 Paroxysmal atrial fibrillation: Secondary | ICD-10-CM

## 2020-09-08 DIAGNOSIS — I10 Essential (primary) hypertension: Secondary | ICD-10-CM | POA: Diagnosis not present

## 2020-09-08 DIAGNOSIS — I5032 Chronic diastolic (congestive) heart failure: Secondary | ICD-10-CM

## 2020-09-08 MED ORDER — DILTIAZEM HCL ER COATED BEADS 120 MG PO CP24: 120.0000 mg | ORAL_CAPSULE | Freq: Every day | ORAL | 3 refills | Status: DC

## 2020-09-08 NOTE — Patient Instructions (Signed)
Medication Instructions:  Your physician recommends that you continue on your current medications as directed. Please refer to the Current Medication list given to you today.  *If you need a refill on your cardiac medications before your next appointment, please call your pharmacy*   Lab Work: none If you have labs (blood work) drawn today and your tests are completely normal, you will receive your results only by: MyChart Message (if you have MyChart) OR A paper copy in the mail If you have any lab test that is abnormal or we need to change your treatment, we will call you to review the results.   Testing/Procedures: none   Follow-Up: At [facility], you and your health needs are our priority.  As part of our continuing mission to provide you with exceptional heart care, we have created designated Provider Care Teams.  These Care Teams include your primary Cardiologist (physician) and Advanced Practice Providers (APPs -  Physician Assistants and Nurse Practitioners) who all work together to provide you with the care you need, when you need it.  We recommend signing up for the patient portal called "MyChart".  Sign up information is provided on this After Visit Summary.  MyChart is used to connect with patients for Virtual Visits (Telemedicine).  Patients are able to view lab/test results, encounter notes, upcoming appointments, etc.  Non-urgent messages can be sent to your provider as well.   To learn more about what you can do with MyChart, go to [email or web address].    Your next appointment:   1 year(s)  The format for your next appointment:   In Person  Provider:   Philip Nahser, MD   Other Instructions   

## 2020-09-13 MED FILL — PFIZER-BIONTECH COVID-19 VA: 30 | 1 days supply | Qty: 0 | Fill #0

## 2020-09-20 ENCOUNTER — Other Ambulatory Visit: Payer: Self-pay

## 2020-09-20 DIAGNOSIS — E785 Hyperlipidemia, unspecified: Secondary | ICD-10-CM

## 2020-09-20 DIAGNOSIS — E119 Type 2 diabetes mellitus without complications: Secondary | ICD-10-CM

## 2020-09-25 ENCOUNTER — Telehealth: Payer: Self-pay | Admitting: Pharmacist

## 2020-09-25 NOTE — Progress Notes (Addendum)
    Chronic Care Management Pharmacy Assistant   Name: Robert Bates  MRN: [id number] DOB: May 22, 1941  Reason for Encounter: Initial Questions   PCP : [name], NP  Allergies:   Allergies  Allergen Reactions   Iodine Other (See Comments)    neck swells   Iohexol Swelling    Neck and gland swelling per patient.    Medications: Outpatient Encounter Medications as of 09/25/2020  Medication Sig   albuterol (PROAIR HFA) 108 (90 Base) MCG/ACT inhaler INHALE 2 PUFFS BY MOUTH INTO THE LUNGS EVERY 6 (SIX) HOURS AS NEEDED FOR WHEEZING OR SHORTNESS OF BREATH.   amoxicillin (AMOXIL) 500 MG capsule Take 1 capsule (500 mg total) by mouth 3 (three) times daily.   diltiazem (CARTIA XT) 120 MG 24 hr capsule Take 1 capsule (120 mg total) by mouth daily.   levETIRAcetam (KEPPRA XR) 500 MG 24 hr tablet Take 2 tablets (1,000 mg total) by mouth daily.   omeprazole (PRILOSEC) 40 MG capsule TAKE 1 CAPSULE BY MOUTH DAILY **NEED OFFICE VISIT FOR FURTHER REFILLS**   pravastatin (PRAVACHOL) 80 MG tablet TAKE 1 TABLET (80 MG TOTAL) BY MOUTH AT BEDTIME.   SYMBICORT 160-4.5 MCG/ACT inhaler INHALE 2 PUFFS INTO THE LUNGS 2 (TWO) TIMES DAILY.   XARELTO 20 MG TABS tablet TAKE 1 TABLET (20 MG TOTAL) BY MOUTH DAILY WITH SUPPER.   No facility-administered encounter medications on file as of 09/25/2020.    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 He stated he had to work and was unable to complete his visit tomorrow. Unable to find an appointment for his needs in November or December. Rescheduled for Friday January 7th at 11:00am over the telephone.   Documented preliminary medication plan in preparation for patient's initial chronic care management visit.   Follow-Up:  Pharmacist Review   Fanny Skates, [place] Pharmacist Assistant [phone]  Reviewed by: De Blanch, PharmD Clinical Pharmacist [facility] [phone]

## 2020-09-26 ENCOUNTER — Ambulatory Visit: Payer: Medicare Other

## 2020-10-05 ENCOUNTER — Other Ambulatory Visit: Payer: Self-pay | Admitting: Family

## 2020-10-05 ENCOUNTER — Telehealth: Payer: Self-pay

## 2020-10-05 ENCOUNTER — Other Ambulatory Visit (HOSPITAL_BASED_OUTPATIENT_CLINIC_OR_DEPARTMENT_OTHER): Payer: Self-pay | Admitting: Family

## 2020-10-05 MED FILL — XARELTO 20 MG TABLET: 20 | 30 days supply | Qty: 30 | Fill #1

## 2020-10-05 MED FILL — OMEPRAZOLE 40 MG CPDR: 40 | 30 days supply | Qty: 30 | Fill #0

## 2020-10-05 MED FILL — CARTIA XT 120 MG CP24: 120 | 90 days supply | Qty: 90 | Fill #0

## 2020-10-05 NOTE — Telephone Encounter (Signed)
Pharmacy called to ask if the patient can be changed from symbicort to advair due to cost.  Please advise. rx needs to go to [facility].

## 2020-10-06 ENCOUNTER — Other Ambulatory Visit (HOSPITAL_BASED_OUTPATIENT_CLINIC_OR_DEPARTMENT_OTHER): Payer: Self-pay | Admitting: Family

## 2020-10-06 MED ORDER — FLUTICASONE-SALMETEROL 250-50 MCG/DOSE IN AEPB: 1.0000 | INHALATION_SPRAY | Freq: Two times a day (BID) | RESPIRATORY_TRACT | 3 refills | Status: DC

## 2020-10-06 MED FILL — FLUTICASONE-SALMETEROL 250-: 250-50 | 30 days supply | Qty: 60 | Fill #0

## 2020-10-06 NOTE — Telephone Encounter (Signed)
D/c symbicort. Please advise pt that I have sent an rx for advair to her pharmacy.

## 2020-10-06 NOTE — Addendum Note (Signed)
Addended by: [name] on: 10/06/2020 12:53 PM   Modules accepted: Orders

## 2020-10-06 NOTE — Telephone Encounter (Signed)
Patient advised of medication change, he said he will pick up on monday

## 2020-10-13 ENCOUNTER — Other Ambulatory Visit (HOSPITAL_BASED_OUTPATIENT_CLINIC_OR_DEPARTMENT_OTHER): Payer: Self-pay | Admitting: Family

## 2020-10-13 ENCOUNTER — Other Ambulatory Visit: Payer: Self-pay | Admitting: Family

## 2020-10-13 MED FILL — ALBUTEROL SULFATE HFA 108 (: 108 (90 BAS | 25 days supply | Qty: 9 | Fill #0

## 2020-11-01 MED FILL — OMEPRAZOLE 40 MG CPDR: 40 | 30 days supply | Qty: 30 | Fill #1

## 2020-11-01 MED FILL — LEVETIRACETAM ER 500 MG TAB: 500 | 30 days supply | Qty: 60 | Fill #1

## 2020-11-01 MED FILL — XARELTO 20 MG TABLET: 20 | 30 days supply | Qty: 30 | Fill #2

## 2020-11-06 ENCOUNTER — Encounter: Payer: Self-pay | Admitting: Family

## 2020-11-06 ENCOUNTER — Other Ambulatory Visit: Payer: Self-pay

## 2020-11-06 ENCOUNTER — Ambulatory Visit (INDEPENDENT_AMBULATORY_CARE_PROVIDER_SITE_OTHER): Payer: Medicare Other | Admitting: Family

## 2020-11-06 VITALS — BP 123/79 | HR 65 | Temp 97.8°F | Resp 16 | Wt 174.0 lb

## 2020-11-06 DIAGNOSIS — H6123 Impacted cerumen, bilateral: Secondary | ICD-10-CM | POA: Diagnosis not present

## 2020-11-06 DIAGNOSIS — H9193 Unspecified hearing loss, bilateral: Secondary | ICD-10-CM | POA: Diagnosis not present

## 2020-11-06 MED FILL — LEVETIRACETAM ER 500 MG TAB: 500 | 30 days supply | Qty: 60 | Fill #1

## 2020-11-06 MED FILL — XARELTO 20 MG TABLET: 20 | 30 days supply | Qty: 30 | Fill #2

## 2020-11-06 MED FILL — OMEPRAZOLE 40 MG CPDR: 40 | 30 days supply | Qty: 30 | Fill #1

## 2020-11-06 NOTE — Patient Instructions (Signed)
You should be contacted about scheduling your appointment with the hearing doctor. Please purchase over the counter ear wax kit such as Debrox or Cerumenex and follow instructions to prevent wax buildup.

## 2020-11-06 NOTE — Progress Notes (Signed)
Subjective:    Patient ID: Robert Bates, male    DOB: [birth year], 80 y.o.   MRN: [id number]  HPI  Patient is a 80 yr old male who presents today with chief complaint of ear fullness.    Review of Systems See HPI  Past Medical History:  Diagnosis Date  . Alcoholism ([place])    past problem per pt but admits to still drinking - unclear how much  . Anxiety   . Arthritis   . 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  [name] COPD (chronic obstructive pulmonary disease) ([place])   . Diabetes type 2, controlled ([place]) 07/31/2017  . Dysrhythmia    a fib  . GERD (gastroesophageal reflux disease)   . Hematuria    refuses work up or referral - understands risks of morbidity / mortality - 11/2008, 12/2008  . History of hiatal hernia   . History of kidney stones   . Hyperlipemia   . 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 [name].   . Peripheral vascular disease ([place])    Abdominal Aortic Aneursym  . Pneumonia    as a child  . Radiation 09/18/15-10/25/15   left lower lobe 70.2 Gy  . Seizures ([place]) 02/18/2020  . Tobacco abuse      Social History   Socioeconomic History  . Marital status: Married    Spouse name: Not on file  . Number of children: 2  . Years of education: Not on file  . Highest education level: Not on file  Occupational History  . Occupation: Retired    Fish farm manager: [employer]    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[place] Use  . Smoking status: Former Smoker    Packs/day: 1.00    Years: 57.00    Pack years: 57.00    Quit date: 08/08/2015    Years since quitting: 5.2  . Smokeless tobacco: Former Systems developer    Types: Chew    Quit date: 11/04/1958  . Tobacco comment: occasional use sneaks around  Vaping Use  . Vaping Use: Every day  Substance and Sexual Activity  . Alcohol use: Yes    Alcohol/week: 0.0 standard drinks    Comment: Pt reports rare use hx of denies  10-01-17  . Drug use: No  . Sexual activity: Yes  Other Topics Concern  . Not on file  Social History Narrative  . Not on file   Social Determinants of Health   Financial Resource Strain: Not on file  Food Insecurity: Not on file  Transportation Needs: Not on file  Physical Activity: Not on file  Stress: Not on file  Social Connections: Not on file  Intimate Partner Violence: Not on file    Past Surgical History:  Procedure Laterality Date  . CHOLECYSTECTOMY N/A 07/23/2017   Procedure: LAPAROSCOPIC CHOLECYSTECTOMY;  Surgeon: Kinsinger, Arta Bruce, MD;  Location: [facility];  Service: General;  Laterality: N/A;  . COLONOSCOPY    . EYE SURGERY Bilateral    Cataracts removed w/ lens implant  . HERNIA REPAIR     Left 36 years ago . Right inguinal hernia repair 10-01-17 Dr. Kieth Brightly  . INGUINAL HERNIA REPAIR Right 10/01/2017   Procedure: RIGHT INGUINAL HERNIA REPAIR WITH MESH;  Surgeon: Kinsinger, Arta Bruce, MD;  Location: [facility];  Service: General;  Laterality: Right;  TAP BLOCK  . INSERTION OF MESH Right 10/01/2017  Procedure: INSERTION OF MESH;  Surgeon: Kinsinger, Arta Bruce, MD;  Location: [facility];  Service: General;  Laterality: Right;  . TONSILLECTOMY    . TONSILLECTOMY    . VIDEO BRONCHOSCOPY Bilateral 07/26/2015   Procedure: VIDEO BRONCHOSCOPY WITH FLUORO;  Surgeon: [name], MD;  Location: [facility];  Service: Cardiopulmonary;  Laterality: Bilateral;  . VIDEO BRONCHOSCOPY WITH ENDOBRONCHIAL NAVIGATION N/A 08/23/2015   Procedure: VIDEO BRONCHOSCOPY WITH ENDOBRONCHIAL NAVIGATION;  Surgeon: Grace Isaac, MD;  Location: [facility];  Service: Thoracic;  Laterality: N/A;  . VIDEO BRONCHOSCOPY WITH ENDOBRONCHIAL ULTRASOUND N/A 08/23/2015   Procedure: VIDEO BRONCHOSCOPY WITH ENDOBRONCHIAL ULTRASOUND;  Surgeon: Grace Isaac, MD;  Location: [facility];  Service: Thoracic;  Laterality: N/A;    Family History  Problem Relation Age of Onset  . Leukemia Father   . Emphysema  Father   . Learning disabilities Son   . Leukemia Other   . Stroke Other     Allergies  Allergen Reactions  . Iodine Other (See Comments)    neck swells  . Iohexol Swelling    Neck and gland swelling per patient.    Current Outpatient Medications on File Prior to Visit  Medication Sig Dispense Refill  . albuterol (VENTOLIN HFA) 108 (90 Base) MCG/ACT inhaler INHALE 2 PUFFS BY MOUTH INTO THE LUNGS EVERY 6 (SIX) HOURS AS NEEDED FOR WHEEZING OR SHORTNESS OF BREATH. 8.5 g 5  . amoxicillin (AMOXIL) 500 MG capsule Take 1 capsule (500 mg total) by mouth 3 (three) times daily. 30 capsule 0  . diltiazem (CARTIA XT) 120 MG 24 hr capsule Take 1 capsule (120 mg total) by mouth daily. 90 capsule 3  . Fluticasone-Salmeterol (ADVAIR DISKUS) 250-50 MCG/DOSE AEPB Inhale 1 puff into the lungs 2 (two) times daily. 1 each 3  . levETIRAcetam (KEPPRA XR) 500 MG 24 hr tablet Take 2 tablets (1,000 mg total) by mouth daily. 60 tablet 5  . omeprazole (PRILOSEC) 40 MG capsule TAKE 1 CAPSULE BY MOUTH DAILY **NEED OFFICE VISIT FOR FURTHER REFILLS** 30 capsule 5  . pravastatin (PRAVACHOL) 80 MG tablet TAKE 1 TABLET (80 MG TOTAL) BY MOUTH AT BEDTIME. 90 tablet 1  . XARELTO 20 MG TABS tablet TAKE 1 TABLET (20 MG TOTAL) BY MOUTH DAILY WITH SUPPER. 30 tablet 5   No current facility-administered medications on file prior to visit.    BP 123/79 (BP Location: Right Arm, Patient Position: Sitting, Cuff Size: Small)   Pulse 65   Temp 97.8 F (36.6 C) (Temporal)   Resp 16   Wt 174 lb (78.9 kg)   SpO2 99%   BMI 25.70 kg/m       Objective:   Physical Exam Constitutional:      Appearance: Normal appearance.  HENT:     Ears:     Comments: Bilateral cerumen impaction Neurological:     Mental Status: He is alert.           Assessment & Plan:  Bilateral cerumen impaction- both ears were irrigated by CMA.  Some remaining cerumen was noted in the right ear canal.  A curette was used by provider to remove the  majority of the right cerumen plug.  Pt had some discomfort and we discontinued. Recommended that he purchase an otc cerumen removal drops/kit and use at home to complete removal of cerumen and prevent recurrent buildup. Pt verbalizes understanding.  This visit occurred during the SARS-CoV-2 public health emergency.  Safety protocols were in place, including screening questions prior  to the visit, additional usage of staff PPE, and extensive cleaning of exam room while observing appropriate contact time as indicated for disinfecting solutions.

## 2020-11-10 ENCOUNTER — Ambulatory Visit: Payer: Medicare Other | Admitting: Pharmacist

## 2020-11-10 DIAGNOSIS — I480 Paroxysmal atrial fibrillation: Secondary | ICD-10-CM

## 2020-11-10 DIAGNOSIS — I10 Essential (primary) hypertension: Secondary | ICD-10-CM

## 2020-11-10 DIAGNOSIS — E785 Hyperlipidemia, unspecified: Secondary | ICD-10-CM

## 2020-11-10 DIAGNOSIS — J449 Chronic obstructive pulmonary disease, unspecified: Secondary | ICD-10-CM

## 2020-11-10 DIAGNOSIS — E119 Type 2 diabetes mellitus without complications: Secondary | ICD-10-CM

## 2020-11-10 NOTE — Chronic Care Management (AMB) (Signed)
Chronic Care Management Pharmacy  Name: Robert Bates  MRN: [id number] DOB: [birth year]  Chief Complaint/ HPI  Robert Bates,  80 y.o. , male presents for their Initial CCM visit with the clinical pharmacist via telephone.  PCP : [name], NP  Their chronic conditions include: Hypertension, Hyperlipidemia, Diabetes, COPD, AFib, GERD, BPH, Seizures  Office Visits: 11/06/20: Visit w/ [name], NP - Bilateral hearing loss. Cerumen impaction. Ears irrigated. Currette used to remove majority of the R cerumen plug. Pt had some discomfort and this was discontinued. Referral to audiology and recommendation to use otc cerumen removal kits.   09/04/20: Visit w/ [name], NP - Pt with back pain, declines referral to PT. Prescribed amoxicillin for possible strep.   Consult Visit: 09/08/20: Cardio visit w/ Dr. Acie Fredrickson - Pt in NSR. Continue Xarelto.   Medications: Outpatient Encounter Medications as of 11/10/2020  Medication Sig Note   albuterol (VENTOLIN HFA) 108 (90 Base) MCG/ACT inhaler INHALE 2 PUFFS BY MOUTH INTO THE LUNGS EVERY 6 (SIX) HOURS AS NEEDED FOR WHEEZING OR SHORTNESS OF BREATH.    diltiazem (CARTIA XT) 120 MG 24 hr capsule Take 1 capsule (120 mg total) by mouth daily.    Fluticasone-Salmeterol (ADVAIR DISKUS) 250-50 MCG/DOSE AEPB Inhale 1 puff into the lungs 2 (two) times daily.    levETIRAcetam (KEPPRA XR) 500 MG 24 hr tablet Take 2 tablets (1,000 mg total) by mouth daily. 11/10/2020: Only taking #1 daily   omeprazole (PRILOSEC) 40 MG capsule TAKE 1 CAPSULE BY MOUTH DAILY **NEED OFFICE VISIT FOR FURTHER REFILLS**    pravastatin (PRAVACHOL) 80 MG tablet TAKE 1 TABLET (80 MG TOTAL) BY MOUTH AT BEDTIME.    XARELTO 20 MG TABS tablet TAKE 1 TABLET (20 MG TOTAL) BY MOUTH DAILY WITH SUPPER.    [DISCONTINUED] amoxicillin (AMOXIL) 500 MG capsule Take 1 capsule (500 mg total) by mouth 3 (three) times daily.    No facility-administered encounter medications on  file as of 11/10/2020.     Current Diagnosis/Assessment:  Goals Addressed            This Visit's Progress    Chronic Care Management Pharmacy Care Plan       CARE PLAN ENTRY (see longitudinal plan of care for additional care plan information)  Current Barriers:   Chronic Disease Management support, education, and care coordination needs related to Hypertension, Hyperlipidemia, Diabetes, COPD, AFib, GERD, BPH, Seizures   Hypertension BP Readings from Last 3 Encounters:  11/06/20 123/79  09/08/20 108/66  09/04/20 130/75    Pharmacist Clinical Goal(s): o Over the next 90 days, patient will work with PharmD and providers to maintain BP goal <140/90  Current regimen:  o Diltiazem XT $RemoveBefo'120mg'YgZqYlQiKld$  daily  Interventions: o Discussed BP goal  Patient self care activities - Over the next 90 days, patient will: o Maintain hypertension medication regimen.    Hyperlipidemia Lab Results  Component Value Date/Time   LDLCALC 76 03/09/2020 02:29 PM   LDLDIRECT 142.2 12/26/2008 10:32 AM    Pharmacist Clinical Goal(s): o Over the next 90 days, patient will work with PharmD and providers to maintain LDL goal < 100  Current regimen:  o Pravastatin $RemoveBefor'80mg'MppUYUunDixE$  daily  Interventions: o Discussed LDL goal  Patient self care activities - Over the next 90 days, patient will: o Maintain cholesterol medication regimen.   Diabetes Lab Results  Component Value Date/Time   HGBA1C 5.9 03/09/2020 02:29 PM   HGBA1C 6.2 11/20/2018 10:40 AM    Pharmacist Clinical Goal(s): o Over  the next 90 days, patient will work with PharmD and providers to achieve A1c goal <7%  Current regimen:  o Diet and exercise management    Interventions: o Discussed DM goals  Patient self care activities - Over the next 90 days, patient will: o Maintain a1c <7%  COPD  Pharmacist Clinical Goal(s) o Over the next 90 days, patient will work with PharmD and providers to reduce symptoms associated with COPD and reduce  barriers to access of preferred medication regimens  Current regimen:   Albuterol HFA 2 puffs every 6 hours as needed for wheezing  Advair 250/73mcg 1 puff twice daily  Interventions: o Discussed patient's preference for Symbicort device vs Advair device o Send Patient Assistance Program (PAP) paperwork  Patient self care activities - Over the next 90 days, patient will: o Complete PAP for Symbicort o Maintain medication regimen for COPD  AFib  Pharmacist Clinical Goal(s) o Over the next 90 days, patient will work with PharmD and providers to reduce risk of stroke associated with Afib and reduce barriers to preferred medication regimen  Current regimen:   Diltiazem XT $RemoveBefo'120mg'KxtgZbWgkin$  daily  Xarelto $Remove'20mg'ktcpEtO$  daily  Interventions: o Send patient PAP for Xarelto  Patient self care activities - Over the next 90 days, patient will: o Complete PAP for Xarelto o Maintain Afib medication regimen  Health Maintenance   Pharmacist Clinical Goal(s) o Over the next 90 days, patient will work with PharmD and providers to complete health maintenance screenings/vaccinations  Interventions: o Discussed Td booster vaccine o Discussed Shingrix vaccine  Patient self care activities - Over the next 90 days, patient will: o Consider receiving vaccines listed above  Medication management  Pharmacist Clinical Goal(s): o Over the next 90 days, patient will work with PharmD and providers to maintain optimal medication adherence  Current pharmacy: [facility]  Interventions o Comprehensive medication review performed. o Continue current medication management strategy  Patient self care activities - Over the next 90 days, patient will: o Focus on medication adherence by filling and taking medications appropriately  o Take medications as prescribed o Report any questions or concerns to PharmD and/or provider(s)  Initial goal documentation        Hypertension   BP goal is:   <140/90  Office blood pressures are  BP Readings from Last 3 Encounters:  11/06/20 123/79  09/08/20 108/66  09/04/20 130/75   Patient checks BP at home infrequently Patient home BP readings are ranging: Unable to assess  Patient has failed these meds in the past: None noted  Patient is currently controlled on the following medications:   Diltiazem XT $RemoveBefo'120mg'MABtQSApOEP$  daily  We discussed BP goal  Plan -Continue current medications     Hyperlipidemia   LDL goal < 100  Last lipids Lab Results  Component Value Date   CHOL 157 03/09/2020   HDL 50.10 03/09/2020   LDLCALC 76 03/09/2020   LDLDIRECT 142.2 12/26/2008   TRIG 154.0 (H) 03/09/2020   CHOLHDL 3 03/09/2020   Hepatic Function Latest Ref Rng & Units 06/20/2020 11/08/2019 07/19/2019  Total Protein 6.5 - 8.1 g/dL 5.9(L) 6.2 6.4(L)  Albumin 3.5 - 5.0 g/dL 4.0 3.9 3.6  AST 15 - 41 U/L 10(L) 13 14(L)  ALT 0 - 44 U/L $Remo'7 13 12  'UuLSS$ Alk Phosphatase 38 - 126 U/L 71 87 79  Total Bilirubin 0.3 - 1.2 mg/dL 0.4 0.5 <0.2(L)  Bilirubin, Direct 0.0 - 0.3 mg/dL - - -     The 10-year ASCVD  risk score Mikey Bussing DC Jr., et al., 2013) is: 52.3%   Values used to calculate the score:     Age: 85 years     Sex: Male     Is Non-Hispanic African American: No     Diabetic: Yes     Tobacco smoker: Yes     Systolic Blood Pressure: 536 mmHg     Is BP treated: Yes     HDL Cholesterol: 50.1 mg/dL     Total Cholesterol: 157 mg/dL   Patient has failed these meds in past: simvastatin (neck/back pain) Patient is currently controlled on the following medications:   Pravastatin $RemoveBefo'80mg'VHTmooiEtDi$  daily  We discussed:  LDL goal  Plan -Continue current medications  Diabetes   A1c goal <7%  Recent Relevant Labs: Lab Results  Component Value Date/Time   HGBA1C 5.9 03/09/2020 02:29 PM   HGBA1C 6.2 11/20/2018 10:40 AM   GFR 80.50 11/08/2019 02:55 PM   GFR 97.86 11/20/2018 10:40 AM   MICROALBUR 1.0 06/01/2020 10:46 AM    Last diabetic Eye exam: No results found for:  HMDIABEYEEXA  Last diabetic Foot exam: No results found for: HMDIABFOOTEX    Patient has failed these meds in past: None noted  Patient is currently controlled on the following medications:  None  We discussed: A1c goal  Plan -Continue control with diet and exercise   COPD / Tobacco   Last spirometry score: 05/19/2017 FVC 2.84 (73%) FEV1 1.74 (62%) FEV1/FVC 61%  Gold Grade: Gold 2 (FEV1 50-79%) Current COPD Classification:  A (low sx, <2 exacerbations/yr)  Eosinophil count:   Lab Results  Component Value Date/Time   EOSPCT 2 06/20/2020 02:05 PM   EOSPCT 2.1 06/17/2017 01:15 PM   EOSPCT 2.8 08/17/2015 01:18 PM  %                               Eos (Absolute):  Lab Results  Component Value Date/Time   EOSABS 0.1 06/20/2020 02:05 PM   EOSABS 0.2 06/17/2017 01:15 PM    Tobacco Status:  Social History   Tobacco Use  Smoking Status Former Smoker   Packs/Misael Mcgaha: 1.00   Years: 57.00   Pack years: 57.00   Quit date: 08/08/2015   Years since quitting: 5.2  Smokeless Tobacco Former Systems developer   Types: Chew   Quit date: 11/04/1958  Tobacco Comment   occasional use sneaks around    Patient has failed these meds in past: symbicort (cost) Patient is currently controlled on the following medications:  Albuterol HFA 2 puffs every 6 hours as needed for wheezing  Advair 250/1mcg 1 puff twice daily Using maintenance inhaler regularly? Yes Frequency of rescue inhaler use:  1-2x per week  Uses rescue inhaler in summer. States he has a hard time getting medication out of the diskus. He would prefer to go back to Symbicort.  He has a hard time breathing in deeply. Unsure of what options available to patient due to cost. Only SOB with exertion  Plan -Continue current medications  -Complete Symbicort PAP   AFIB   Patient is currently rate controlled.  Patient has failed these meds in past: None noted  Patient is currently controlled on the following  medications:  Diltiazem XT $RemoveBefo'120mg'LSHGuDTXzrS$  daily  Xarelto $Remove'20mg'YUioBZO$  daily  Denies symptoms of Afib States Xarelto cost is sometimes difficult  Plan -Continue current medications  -Complete Xarelto PAP  GERD   Patient has failed these meds in past:  None noted  Patient is currently controlled on the following medications:   Omeprazole $RemoveBef'40mg'CNGORLipsE$  daily  Triggers: laying down after eating  "If I can't take that, I can't eat"   Plan -Continue current medications   BPH   PSA  Date Value Ref Range Status  03/09/2020 1.61 0.10 - 4.00 ng/mL Final    Comment:    Test performed using Access Hybritech PSA Assay, a parmagnetic partical, chemiluminecent immunoassay.  07/30/2017 2.48 0.10 - 4.00 ng/mL Final    Comment:    Test performed using Access Hybritech PSA Assay, a parmagnetic partical, chemiluminecent immunoassay.  11/17/2009 1.88 0.10 - 4.00 ng/mL Final    Comment:    See lab report for associated comment(s)     Patient has failed these meds in past: tamsulosin?  Patient is currently controlled on the following medications:   None   Patient states he tried tamsulosin, but he didn't know if it worked so he didn't ask for refills.  Gets up twice during the night Feels he empties his bladder States his symptoms aren't bothering him  Plan -Continue current management -Encouraged patient to follow up if symptoms become bothersome.  Seizures    Patient has failed these meds in past: None noted  Patient is currently controlled on the following medications:  Levetiracetam XR $RemoveBeforeDE'500mg'aVyVkCfPOwzGzdy$  #2 daily (only taking one a Ajna Moors)  He wishes he could get off of this medication.  He feels he doesn't think he ever had a seizure. He has not had seizures despite taking only one tablet of keppra Encouraged him to keep follow up with neurology.  Plan -Continue current medications   -Follow up with neurology  Vaccines   Reviewed and discussed patient's vaccination history.    Immunization History   Administered Date(s) Administered   Fluad Quad(high Dose 65+) 07/23/2019, 09/04/2020   Influenza Split 08/02/2011, 08/18/2012   Influenza Whole 08/17/2008   Influenza, High Dose Seasonal PF 08/09/2015, 09/30/2016, 07/30/2017, 08/07/2018   Influenza,inj,Quad PF,6+ Mos 07/27/2013, 08/09/2014   PFIZER SARS-COV-2 Vaccination 01/28/2020, 02/21/2020, 09/04/2020   Pneumococcal Conjugate-13 08/09/2014   Pneumococcal Polysaccharide-23 05/14/2010   Td 07/25/2008   Td booster? No  Shingrix? No He asks about colonoscopy. Discussed USPSTF recommendation and that he has aged out unless having symtoms. He denies symptoms.   Plan -Recommended patient receive Shingrix vaccine in pharmacy -Recommend patient receive Td booster in pharmacy   Medication Management   Patient's preferred pharmacy is:  [facility], [address] [place] [address] Phone: [phone] Fax: [facility] [address], [place] - [place] [address] [place] [address] Phone: [phone] Fax: [phone]  [facility], [place], [address] [place], [place] [address] Phone: [phone] Fax: [phone]  Plan  Continue current medication management strategy    Follow up: 3 month phone visit  Melvenia Beam Yee Joss, PharmD, BCACP Clinical Pharmacist [facility] [phone]

## 2020-11-11 NOTE — Patient Instructions (Addendum)
Visit Information  Goals Addressed            This Visit's Progress   . Chronic Care Management Pharmacy Care Plan       CARE PLAN ENTRY (see longitudinal plan of care for additional care plan information)  Current Barriers:  . Chronic Disease Management support, education, and care coordination needs related to Hypertension, Hyperlipidemia, Diabetes, COPD, AFib, GERD, BPH, Seizures   Hypertension BP Readings from Last 3 Encounters:  11/06/20 123/79  09/08/20 108/66  09/04/20 130/75   . Pharmacist Clinical Goal(s): o Over the next 90 days, patient will work with PharmD and providers to maintain BP goal <140/90 . Current regimen:  o Diltiazem XT $RemoveBefo'120mg'UqfCwcNvbIO$  daily . Interventions: o Discussed BP goal . Patient self care activities - Over the next 90 days, patient will: o Maintain hypertension medication regimen.    Hyperlipidemia Lab Results  Component Value Date/Time   LDLCALC 76 03/09/2020 02:29 PM   LDLDIRECT 142.2 12/26/2008 10:32 AM   . Pharmacist Clinical Goal(s): o Over the next 90 days, patient will work with PharmD and providers to maintain LDL goal < 100 . Current regimen:  o Pravastatin $RemoveBefor'80mg'YAyqVrhTJVVC$  daily . Interventions: o Discussed LDL goal . Patient self care activities - Over the next 90 days, patient will: o Maintain cholesterol medication regimen.   Diabetes Lab Results  Component Value Date/Time   HGBA1C 5.9 03/09/2020 02:29 PM   HGBA1C 6.2 11/20/2018 10:40 AM   . Pharmacist Clinical Goal(s): o Over the next 90 days, patient will work with PharmD and providers to achieve A1c goal <7% . Current regimen:  o Diet and exercise management   . Interventions: o Discussed DM goals . Patient self care activities - Over the next 90 days, patient will: o Maintain a1c <7%  COPD . Pharmacist Clinical Goal(s) o Over the next 90 days, patient will work with PharmD and providers to reduce symptoms associated with COPD and reduce barriers to access of preferred  medication regimens . Current regimen:   Albuterol HFA 2 puffs every 6 hours as needed for wheezing  Advair 250/24mcg 1 puff twice daily . Interventions: o Discussed patient's preference for Symbicort device vs Advair device o Send Patient Assistance Program (PAP) paperwork . Patient self care activities - Over the next 90 days, patient will: o Complete PAP for Symbicort o Maintain medication regimen for COPD  AFib . Pharmacist Clinical Goal(s) o Over the next 90 days, patient will work with PharmD and providers to reduce risk of stroke associated with Afib and reduce barriers to preferred medication regimen . Current regimen:   Diltiazem XT $RemoveBefo'120mg'DrohNNUHQmK$  daily  Xarelto $Remove'20mg'dOVUxNy$  daily . Interventions: o Send patient PAP for Xarelto . Patient self care activities - Over the next 90 days, patient will: o Complete PAP for Xarelto o Maintain Afib medication regimen  Health Maintenance  . Pharmacist Clinical Goal(s) o Over the next 90 days, patient will work with PharmD and providers to complete health maintenance screenings/vaccinations . Interventions: o Discussed Td booster vaccine o Discussed Shingrix vaccine . Patient self care activities - Over the next 90 days, patient will: o Consider receiving vaccines listed above  Medication management . Pharmacist Clinical Goal(s): o Over the next 90 days, patient will work with PharmD and providers to maintain optimal medication adherence . Current pharmacy: [facility] . Interventions o Comprehensive medication review performed. o Continue current medication management strategy . Patient self care activities - Over the next 90 days, patient will:  o Focus on medication adherence by filling and taking medications appropriately  o Take medications as prescribed o Report any questions or concerns to PharmD and/or provider(s)  Initial goal documentation        Robert Bates was given information about Chronic Care Management services  today including:  1. CCM service includes personalized support from designated clinical staff supervised by his physician, including individualized plan of care and coordination with other care providers 2. 24/7 contact phone numbers for assistance for urgent and routine care needs. 3. Standard insurance, coinsurance, copays and deductibles apply for chronic care management only during months in which we provide at least 20 minutes of these services. Most insurances cover these services at 100%, however patients may be responsible for any copay, coinsurance and/or deductible if applicable. This service may help you avoid the need for more expensive face-to-face services. 4. Only one practitioner may furnish and bill the service in a calendar month. 5. The patient may stop CCM services at any time (effective at the end of the month) by phone call to the office staff.  Patient agreed to services and verbal consent obtained.   The patient verbalized understanding of instructions, educational materials, and care plan provided today and agreed to receive a mailed copy of patient instructions, educational materials, and care plan.  Telephone follow up appointment with pharmacy team member scheduled for: 02/08/2021  Robert Bates, [facility]    Healthy Eating Following a healthy eating pattern may help you to achieve and maintain a healthy body weight, reduce the risk of chronic disease, and live a long and productive life. It is important to follow a healthy eating pattern at an appropriate calorie level for your body. Your nutritional needs should be met primarily through food by choosing a variety of nutrient-rich foods. What are tips for following this plan? Reading food labels  Read labels and choose the following: ? Reduced or low sodium. ? Juices with 100% fruit juice. ? Foods with low saturated fats and high polyunsaturated and monounsaturated fats. ? Foods with whole grains, such as whole wheat,  cracked wheat, brown rice, and wild rice. ? Whole grains that are fortified with folic acid. This is recommended for women who are pregnant or who want to become pregnant.  Read labels and avoid the following: ? Foods with a lot of added sugars. These include foods that contain brown sugar, corn sweetener, corn syrup, dextrose, fructose, glucose, high-fructose corn syrup, honey, invert sugar, lactose, malt syrup, maltose, molasses, raw sugar, sucrose, trehalose, or turbinado sugar.  Do not eat more than the following amounts of added sugar per Samaa Ueda:  6 teaspoons (25 g) for women.  9 teaspoons (38 g) for men. ? Foods that contain processed or refined starches and grains. ? Refined grain products, such as white flour, degermed cornmeal, white bread, and white rice. Shopping  Choose nutrient-rich snacks, such as vegetables, whole fruits, and nuts. Avoid high-calorie and high-sugar snacks, such as potato chips, fruit snacks, and candy.  Use oil-based dressings and spreads on foods instead of solid fats such as butter, stick margarine, or cream cheese.  Limit pre-made sauces, mixes, and "instant" products such as flavored rice, instant noodles, and ready-made pasta.  Try more plant-protein sources, such as tofu, tempeh, black beans, edamame, lentils, nuts, and seeds.  Explore eating plans such as the Mediterranean diet or vegetarian diet. Cooking  Use oil to saut or stir-fry foods instead of solid fats such as butter, stick margarine, or lard.  Try baking, boiling, grilling, or broiling instead of frying.  Remove the fatty part of meats before cooking.  Steam vegetables in water or broth. Meal planning   At meals, imagine dividing your plate into fourths: ? One-half of your plate is fruits and vegetables. ? One-fourth of your plate is whole grains. ? One-fourth of your plate is protein, especially lean meats, poultry, eggs, tofu, beans, or nuts.  Include low-fat dairy as part of  your daily diet. Lifestyle  Choose healthy options in all settings, including home, work, school, restaurants, or stores.  Prepare your food safely: ? Wash your hands after handling raw meats. ? Keep food preparation surfaces clean by regularly washing with hot, soapy water. ? Keep raw meats separate from ready-to-eat foods, such as fruits and vegetables. ? Cook seafood, meat, poultry, and eggs to the recommended internal temperature. ? Store foods at safe temperatures. In general:  Keep cold foods at 8F (4.4C) or below.  Keep hot foods at 18F (60C) or above.  Keep your freezer at [facility] (-17.8C) or below.  Foods are no longer safe to eat when they have been between the temperatures of 40-18F (4.4-60C) for more than 2 hours. What foods should I eat? Fruits Aim to eat 2 cup-equivalents of fresh, canned (in natural juice), or frozen fruits each Easter Kennebrew. Examples of 1 cup-equivalent of fruit include 1 small apple, 8 large strawberries, 1 cup canned fruit,  cup dried fruit, or 1 cup 100% juice. Vegetables Aim to eat 2-3 cup-equivalents of fresh and frozen vegetables each Salem Mastrogiovanni, including different varieties and colors. Examples of 1 cup-equivalent of vegetables include 2 medium carrots, 2 cups raw, leafy greens, 1 cup chopped vegetable (raw or cooked), or 1 medium baked potato. Grains Aim to eat 6 ounce-equivalents of whole grains each Temisha Murley. Examples of 1 ounce-equivalent of grains include 1 slice of bread, 1 cup ready-to-eat cereal, 3 cups popcorn, or  cup cooked rice, pasta, or cereal. Meats and other proteins Aim to eat 5-6 ounce-equivalents of protein each Kyoko Elsea. Examples of 1 ounce-equivalent of protein include 1 egg, 1/2 cup nuts or seeds, or 1 tablespoon (16 g) peanut butter. A cut of meat or fish that is the size of a deck of cards is about 3-4 ounce-equivalents.  Of the protein you eat each week, try to have at least 8 ounces come from seafood. This includes salmon, trout,  herring, and anchovies. Dairy Aim to eat 3 cup-equivalents of fat-free or low-fat dairy each Hazim Treadway. Examples of 1 cup-equivalent of dairy include 1 cup (240 mL) milk, 8 ounces (250 g) yogurt, 1 ounces (44 g) natural cheese, or 1 cup (240 mL) fortified soy milk. Fats and oils  Aim for about 5 teaspoons (21 g) per Jamesetta Greenhalgh. Choose monounsaturated fats, such as canola and olive oils, avocados, peanut butter, and most nuts, or polyunsaturated fats, such as sunflower, corn, and soybean oils, walnuts, pine nuts, sesame seeds, sunflower seeds, and flaxseed. Beverages  Aim for six 8-oz glasses of water per Myla Mauriello. Limit coffee to three to five 8-oz cups per Danasha Melman.  Limit caffeinated beverages that have added calories, such as soda and energy drinks.  Limit alcohol intake to no more than 1 drink a Kash Davie for nonpregnant women and 2 drinks a Aleks Nawrot for men. One drink equals 12 oz of beer (355 mL), 5 oz of wine (148 mL), or 1 oz of hard liquor (44 mL). Seasoning and other foods  Avoid adding excess amounts of salt to your foods. Try  flavoring foods with herbs and spices instead of salt.  Avoid adding sugar to foods.  Try using oil-based dressings, sauces, and spreads instead of solid fats. This information is based on general [place] nutrition guidelines. For more information, visit [employer]. Exact amounts may vary based on your nutrition needs. Summary  A healthy eating plan may help you to maintain a healthy weight, reduce the risk of chronic diseases, and stay active throughout your life.  Plan your meals. Make sure you eat the right portions of a variety of nutrient-rich foods.  Try baking, boiling, grilling, or broiling instead of frying.  Choose healthy options in all settings, including home, work, school, restaurants, or stores. This information is not intended to replace advice given to you by your health care provider. Make sure you discuss any questions you have with your health care  provider. Document Revised: 02/02/2018 Document Reviewed: 02/02/2018 Elsevier Patient Education  [place].

## 2020-11-12 NOTE — Progress Notes (Signed)
I have personally reviewed this encounter including the documentation in this note and have collaborated with the care management provider regarding care management and care coordination activities to include development and update of the comprehensive care plan. I am certifying that I agree with the content of this note and encounter as supervising provider.  Nance Pear NP

## 2020-11-13 ENCOUNTER — Telehealth: Payer: Self-pay | Admitting: Family

## 2020-11-13 NOTE — Telephone Encounter (Signed)
Left message on voicemail advising pt to go to urgent care or ED today.

## 2020-11-13 NOTE — Telephone Encounter (Signed)
Spoke to patient and he reports he has been getting sharp abdominal pains, this are worst with movement and breathing. Patient reports he is at work and "he is not finish yet".  Patient was advised to try to get out as soon as he can and seek in person evaluation. He said he will call us tomorrow for an appointment, again patient was encourage not to wait and seek in person assessment today at urgent care or ed.

## 2020-11-13 NOTE — Telephone Encounter (Signed)
Patient states he is having bad abdominal pain he would like  for Dr.O'sullivan to return he call   Please advice

## 2020-11-24 ENCOUNTER — Ambulatory Visit: Payer: Medicare Other | Admitting: Podiatry

## 2020-12-06 MED FILL — OMEPRAZOLE 40 MG CPDR: 40 | 30 days supply | Qty: 30 | Fill #2

## 2020-12-08 ENCOUNTER — Encounter: Payer: Self-pay | Admitting: Family

## 2020-12-08 ENCOUNTER — Other Ambulatory Visit (HOSPITAL_BASED_OUTPATIENT_CLINIC_OR_DEPARTMENT_OTHER): Payer: Self-pay | Admitting: Family

## 2020-12-08 ENCOUNTER — Ambulatory Visit: Payer: Medicare Other | Admitting: Family

## 2020-12-08 ENCOUNTER — Other Ambulatory Visit: Payer: Self-pay

## 2020-12-08 ENCOUNTER — Ambulatory Visit (INDEPENDENT_AMBULATORY_CARE_PROVIDER_SITE_OTHER): Payer: Medicare Other | Admitting: Family

## 2020-12-08 VITALS — BP 129/68 | HR 66 | Temp 97.8°F | Resp 16 | Wt 168.4 lb

## 2020-12-08 DIAGNOSIS — M542 Cervicalgia: Secondary | ICD-10-CM | POA: Diagnosis not present

## 2020-12-08 DIAGNOSIS — N401 Enlarged prostate with lower urinary tract symptoms: Secondary | ICD-10-CM

## 2020-12-08 DIAGNOSIS — D329 Benign neoplasm of meninges, unspecified: Secondary | ICD-10-CM | POA: Diagnosis not present

## 2020-12-08 DIAGNOSIS — E119 Type 2 diabetes mellitus without complications: Secondary | ICD-10-CM

## 2020-12-08 DIAGNOSIS — I480 Paroxysmal atrial fibrillation: Secondary | ICD-10-CM | POA: Diagnosis not present

## 2020-12-08 DIAGNOSIS — G8929 Other chronic pain: Secondary | ICD-10-CM

## 2020-12-08 DIAGNOSIS — K219 Gastro-esophageal reflux disease without esophagitis: Secondary | ICD-10-CM | POA: Diagnosis not present

## 2020-12-08 DIAGNOSIS — R569 Unspecified convulsions: Secondary | ICD-10-CM | POA: Diagnosis not present

## 2020-12-08 DIAGNOSIS — M549 Dorsalgia, unspecified: Secondary | ICD-10-CM | POA: Diagnosis not present

## 2020-12-08 DIAGNOSIS — C3410 Malignant neoplasm of upper lobe, unspecified bronchus or lung: Secondary | ICD-10-CM | POA: Diagnosis not present

## 2020-12-08 DIAGNOSIS — I10 Essential (primary) hypertension: Secondary | ICD-10-CM | POA: Diagnosis not present

## 2020-12-08 MED ORDER — FLUTICASONE PROPIONATE HFA 110 MCG/ACT IN AERO: 1.0000 | INHALATION_SPRAY | Freq: Two times a day (BID) | RESPIRATORY_TRACT | 5 refills | Status: DC

## 2020-12-08 MED ORDER — TAMSULOSIN HCL 0.4 MG PO CAPS: 0.4000 mg | ORAL_CAPSULE | Freq: Every day | ORAL | 3 refills | Status: DC

## 2020-12-08 MED ORDER — ALBUTEROL SULFATE HFA 108 (90 BASE) MCG/ACT IN AERS: 2.0000 | INHALATION_SPRAY | Freq: Four times a day (QID) | RESPIRATORY_TRACT | 5 refills | Status: DC | PRN

## 2020-12-08 MED FILL — TAMSULOSIN HCL 0.4 MG CAP: 0.4 | 30 days supply | Qty: 30 | Fill #0

## 2020-12-08 MED FILL — ALBUTEROL SULFATE HFA 108 (: 108 (90 BAS | 25 days supply | Qty: 9 | Fill #0

## 2020-12-08 MED FILL — FLOVENT HFA 110 MCG INHALER: 110 | 30 days supply | Qty: 12 | Fill #0

## 2020-12-08 NOTE — Patient Instructions (Addendum)
Start Flomax once daily for prostate.  Start Flovent 1 puff twice daily to prevent wheezing. (maintenance inhaler). You may use albuterol as needed for wheezing (rescue inhaler).  Back Exercises The following exercises strengthen the muscles that help to support the trunk and back. They also help to keep the lower back flexible. Doing these exercises can help to prevent back pain or lessen existing pain.  If you have back pain or discomfort, try doing these exercises 2-3 times each day or as told by your health care provider.  As your pain improves, do them once each day, but increase the number of times that you repeat the steps for each exercise (do more repetitions).  To prevent the recurrence of back pain, continue to do these exercises once each day or as told by your health care provider. Do exercises exactly as told by your health care provider and adjust them as directed. It is normal to feel mild stretching, pulling, tightness, or discomfort as you do these exercises, but you should stop right away if you feel sudden pain or your pain gets worse. Exercises Single knee to chest Repeat these steps 3-5 times for each leg: 1. Lie on your back on a firm bed or the floor with your legs extended. 2. Bring one knee to your chest. Your other leg should stay extended and in contact with the floor. 3. Hold your knee in place by grabbing your knee or thigh with both hands and hold. 4. Pull on your knee until you feel a gentle stretch in your lower back or buttocks. 5. Hold the stretch for 10-30 seconds. 6. Slowly release and straighten your leg. Pelvic tilt Repeat these steps 5-10 times: 1. Lie on your back on a firm bed or the floor with your legs extended. 2. Bend your knees so they are pointing toward the ceiling and your feet are flat on the floor. 3. Tighten your lower abdominal muscles to press your lower back against the floor. This motion will tilt your pelvis so your tailbone points  up toward the ceiling instead of pointing to your feet or the floor. 4. With gentle tension and even breathing, hold this position for 5-10 seconds. Cat-cow Repeat these steps until your lower back becomes more flexible: 1. Get into a hands-and-knees position on a firm surface. Keep your hands under your shoulders, and keep your knees under your hips. You may place padding under your knees for comfort. 2. Let your head hang down toward your chest. Contract your abdominal muscles and point your tailbone toward the floor so your lower back becomes rounded like the back of a cat. 3. Hold this position for 5 seconds. 4. Slowly lift your head, let your abdominal muscles relax and point your tailbone up toward the ceiling so your back forms a sagging arch like the back of a cow. 5. Hold this position for 5 seconds.   Press-ups Repeat these steps 5-10 times: 1. Lie on your abdomen (face-down) on the floor. 2. Place your palms near your head, about shoulder-width apart. 3. Keeping your back as relaxed as possible and keeping your hips on the floor, slowly straighten your arms to raise the top half of your body and lift your shoulders. Do not use your back muscles to raise your upper torso. You may adjust the placement of your hands to make yourself more comfortable. 4. Hold this position for 5 seconds while you keep your back relaxed. 5. Slowly return to lying flat on  the floor.   Bridges Repeat these steps 10 times: 1. Lie on your back on a firm surface. 2. Bend your knees so they are pointing toward the ceiling and your feet are flat on the floor. Your arms should be flat at your sides, next to your body. 3. Tighten your buttocks muscles and lift your buttocks off the floor until your waist is at almost the same height as your knees. You should feel the muscles working in your buttocks and the back of your thighs. If you do not feel these muscles, slide your feet 1-2 inches farther away from your  buttocks. 4. Hold this position for 3-5 seconds. 5. Slowly lower your hips to the starting position, and allow your buttocks muscles to relax completely. If this exercise is too easy, try doing it with your arms crossed over your chest.   Abdominal crunches Repeat these steps 5-10 times: 1. Lie on your back on a firm bed or the floor with your legs extended. 2. Bend your knees so they are pointing toward the ceiling and your feet are flat on the floor. 3. Cross your arms over your chest. 4. Tip your chin slightly toward your chest without bending your neck. 5. Tighten your abdominal muscles and slowly raise your trunk (torso) high enough to lift your shoulder blades a tiny bit off the floor. Avoid raising your torso higher than that because it can put too much stress on your low back and does not help to strengthen your abdominal muscles. 6. Slowly return to your starting position. Back lifts Repeat these steps 5-10 times: 1. Lie on your abdomen (face-down) with your arms at your sides, and rest your forehead on the floor. 2. Tighten the muscles in your legs and your buttocks. 3. Slowly lift your chest off the floor while you keep your hips pressed to the floor. Keep the back of your head in line with the curve in your back. Your eyes should be looking at the floor. 4. Hold this position for 3-5 seconds. 5. Slowly return to your starting position. Contact a health care provider if:  Your back pain or discomfort gets much worse when you do an exercise.  Your worsening back pain or discomfort does not lessen within 2 hours after you exercise. If you have any of these problems, stop doing these exercises right away. Do not do them again unless your health care provider says that you can. Get help right away if:  You develop sudden, severe back pain. If this happens, stop doing the exercises right away. Do not do them again unless your health care provider says that you can. This information  is not intended to replace advice given to you by your health care provider. Make sure you discuss any questions you have with your health care provider. Document Revised: 02/25/2019 Document Reviewed: 07/23/2018 Elsevier Patient Education  [place].

## 2020-12-08 NOTE — Progress Notes (Signed)
Subjective:    Patient ID: Robert Bates, male    DOB: 03-15-1941, 80 y.o.   MRN: [id number]  HPI  Patient is a 80 yr old male who presents today for follow up.  HTN- maintained on diltiazem 120mg .    BP Readings from Last 3 Encounters:  12/08/20 129/68  11/06/20 123/79  09/08/20 108/66   DM2-  Lab Results  Component Value Date   HGBA1C 5.9 03/09/2020   HGBA1C 6.2 11/20/2018   HGBA1C 6.1 12/25/2017   Lab Results  Component Value Date   MICROALBUR 1.0 06/01/2020   LDLCALC 76 03/09/2020   CREATININE 1.01 06/20/2020   Back/neck pain- Had CT neck 10/30/19 which noted Disc space narrowing and endplate spur formation C5-C6, C6-C7, C7-T1.   Hx of seizure- maintained on keppra. No recent seizure activity.  GERD- maintained on omeprazole.   Notes that he has dizziness if he lays on his left side.   Reports that he has urinary frequency.    Review of Systems See HPI  Past Medical History:  Diagnosis Date  . Alcoholism ([place])    past problem per pt but admits to still drinking - unclear how much  . Anxiety   . Arthritis   . 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  [name] COPD (chronic obstructive pulmonary disease) ([place])   . Diabetes type 2, controlled ([place]) 07/31/2017  . Dysrhythmia    a fib  . GERD (gastroesophageal reflux disease)   . Hematuria    refuses work up or referral - understands risks of morbidity / mortality - 11/2008, 12/2008  . History of hiatal hernia   . History of kidney stones   . Hyperlipemia   . Meningioma ([place]) 10/25/2013   Follows with Dr. [name].   . Peripheral vascular disease ([place])    Abdominal Aortic Aneursym  . Pneumonia    as a child  . Radiation 09/18/15-10/25/15   left lower lobe 70.2 Gy  . Seizures ([place]) 02/18/2020  . Tobacco abuse      Social History   Socioeconomic History  . Marital status: Married    Spouse name: Not on file  .  Number of children: 2  . Years of education: Not on file  . Highest education level: Not on file  Occupational History  . Occupation: Retired    Fish farm manager: [employer]    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[place] Use  . Smoking status: Former Smoker    Packs/day: 1.00    Years: 57.00    Pack years: 57.00    Quit date: 08/08/2015    Years since quitting: 5.3  . Smokeless tobacco: Former Systems developer    Types: Chew    Quit date: 11/04/1958  . Tobacco comment: occasional use sneaks around  Vaping Use  . Vaping Use: Every day  Substance and Sexual Activity  . Alcohol use: Yes    Alcohol/week: 0.0 standard drinks    Comment: Pt reports rare use hx of denies 10-01-17  . Drug use: No  . Sexual activity: Yes  Other Topics Concern  . Not on file  Social History Narrative  . Not on file   Social Determinants of Health   Financial Resource Strain: Medium Risk  . Difficulty of Paying Living Expenses: Somewhat hard  Food Insecurity: Not on file  Transportation Needs: Not on file  Physical Activity: Not on  file  Stress: Not on file  Social Connections: Not on file  Intimate Partner Violence: Not on file    Past Surgical History:  Procedure Laterality Date  . CHOLECYSTECTOMY N/A 07/23/2017   Procedure: LAPAROSCOPIC CHOLECYSTECTOMY;  Surgeon: Kinsinger, Arta Bruce, MD;  Location: [facility];  Service: General;  Laterality: N/A;  . COLONOSCOPY    . EYE SURGERY Bilateral    Cataracts removed w/ lens implant  . HERNIA REPAIR     Left 36 years ago . Right inguinal hernia repair 10-01-17 Dr. Kieth Brightly  . INGUINAL HERNIA REPAIR Right 10/01/2017   Procedure: RIGHT INGUINAL HERNIA REPAIR WITH MESH;  Surgeon: Kinsinger, Arta Bruce, MD;  Location: [facility];  Service: General;  Laterality: Right;  TAP BLOCK  . INSERTION OF MESH Right 10/01/2017   Procedure: INSERTION OF MESH;  Surgeon: Kinsinger, Arta Bruce, MD;  Location: [facility];  Service: General;  Laterality: Right;  . TONSILLECTOMY     . TONSILLECTOMY    . VIDEO BRONCHOSCOPY Bilateral 07/26/2015   Procedure: VIDEO BRONCHOSCOPY WITH FLUORO;  Surgeon: [name], MD;  Location: [facility];  Service: Cardiopulmonary;  Laterality: Bilateral;  . VIDEO BRONCHOSCOPY WITH ENDOBRONCHIAL NAVIGATION N/A 08/23/2015   Procedure: VIDEO BRONCHOSCOPY WITH ENDOBRONCHIAL NAVIGATION;  Surgeon: Grace Isaac, MD;  Location: [facility];  Service: Thoracic;  Laterality: N/A;  . VIDEO BRONCHOSCOPY WITH ENDOBRONCHIAL ULTRASOUND N/A 08/23/2015   Procedure: VIDEO BRONCHOSCOPY WITH ENDOBRONCHIAL ULTRASOUND;  Surgeon: Grace Isaac, MD;  Location: [facility];  Service: Thoracic;  Laterality: N/A;    Family History  Problem Relation Age of Onset  . Leukemia Father   . Emphysema Father   . Learning disabilities Son   . Leukemia Other   . Stroke Other     Allergies  Allergen Reactions  . Iodine Other (See Comments)    neck swells  . Iohexol Swelling    Neck and gland swelling per patient.    Current Outpatient Medications on File Prior to Visit  Medication Sig Dispense Refill  . albuterol (VENTOLIN HFA) 108 (90 Base) MCG/ACT inhaler INHALE 2 PUFFS BY MOUTH INTO THE LUNGS EVERY 6 (SIX) HOURS AS NEEDED FOR WHEEZING OR SHORTNESS OF BREATH. 8.5 g 5  . diltiazem (CARTIA XT) 120 MG 24 hr capsule Take 1 capsule (120 mg total) by mouth daily. 90 capsule 3  . Fluticasone-Salmeterol (ADVAIR DISKUS) 250-50 MCG/DOSE AEPB Inhale 1 puff into the lungs 2 (two) times daily. 1 each 3  . levETIRAcetam (KEPPRA XR) 500 MG 24 hr tablet Take 2 tablets (1,000 mg total) by mouth daily. 60 tablet 5  . omeprazole (PRILOSEC) 40 MG capsule TAKE 1 CAPSULE BY MOUTH DAILY **NEED OFFICE VISIT FOR FURTHER REFILLS** 30 capsule 5  . pravastatin (PRAVACHOL) 80 MG tablet TAKE 1 TABLET (80 MG TOTAL) BY MOUTH AT BEDTIME. 90 tablet 1  . XARELTO 20 MG TABS tablet TAKE 1 TABLET (20 MG TOTAL) BY MOUTH DAILY WITH SUPPER. 30 tablet 5   No current facility-administered medications  on file prior to visit.    BP 129/68 (BP Location: Right Arm, Patient Position: Sitting, Cuff Size: Small)   Pulse 66   Temp 97.8 F (36.6 C) (Oral)   Resp 16   Wt 168 lb 6.4 oz (76.4 kg)   SpO2 98%   BMI 24.87 kg/m       Objective:   Physical Exam Constitutional:      General: He is not in acute distress.    Appearance: He is well-developed  and well-nourished.  HENT:     Head: Normocephalic and atraumatic.  Cardiovascular:     Rate and Rhythm: Normal rate and regular rhythm.     Heart sounds: No murmur heard.   Pulmonary:     Effort: Pulmonary effort is normal. No respiratory distress.     Breath sounds: Normal breath sounds. No wheezing or rales.  Musculoskeletal:        General: No edema.  Skin:    General: Skin is warm and dry.  Neurological:     Mental Status: He is alert and oriented to person, place, and time.  Psychiatric:        Mood and Affect: Mood and affect normal.        Behavior: Behavior normal.        Thought Content: Thought content normal.           Assessment & Plan:  BPH- not currently on flomax.  I think this was stopped back when he experienced his seizure.  Will restart flomax 0.4mg  once daily.  Lab Results  Component Value Date   PSA 1.61 03/09/2020   PSA 2.48 07/30/2017   PSA 1.88 11/17/2009   Seizure disorder- stable on Keppra- management per neurology.  Neck/back pain- states he did not do the exercises we recommended.  I gave him the list again and recommended that he do them at home. May use tylenol as needed. Offered PT referral, but he declines.  DM2-  Lab Results  Component Value Date   HGBA1C 6.0 (H) 12/08/2020   Follow up A1C remains at goal. Continue diabetic diet.   Hx of lung cancer- continues to follow with oncology.    GERD- stable on omeprazole 40mg . Continue same.  Hx of meningioma- it looks like neurology ordered MR of the brain 4/21 and that this was never completed. I will reach out to his neurology  team to see if they would like to re-order this test.  BPPV- mild vertigo per report.  Will monitor for now.  PAF- on xarelto- and followed by cardiology.   This visit occurred during the SARS-CoV-2 public health emergency.  Safety protocols were in place, including screening questions prior to the visit, additional usage of staff PPE, and extensive cleaning of exam room while observing appropriate contact time as indicated for disinfecting solutions.

## 2020-12-09 LAB — COMPREHENSIVE METABOLIC PANEL
AG Ratio: 1.7 (calc) (ref 1.0–2.5)
ALT: 12 U/L (ref 9–46)
AST: 13 U/L (ref 10–35)
Albumin: 3.8 g/dL (ref 3.6–5.1)
Alkaline phosphatase (APISO): 79 U/L (ref 35–144)
BUN: 17 mg/dL (ref 7–25)
CO2: 28 mmol/L (ref 20–32)
Calcium: 9.4 mg/dL (ref 8.6–10.3)
Chloride: 104 mmol/L (ref 98–110)
Creat: 0.84 mg/dL (ref 0.70–1.18)
Globulin: 2.3 g/dL (calc) (ref 1.9–3.7)
Glucose, Bld: 87 mg/dL (ref 65–99)
Potassium: 4 mmol/L (ref 3.5–5.3)
Sodium: 141 mmol/L (ref 135–146)
Total Bilirubin: 0.4 mg/dL (ref 0.2–1.2)
Total Protein: 6.1 g/dL (ref 6.1–8.1)

## 2020-12-09 LAB — HEMOGLOBIN A1C
Hgb A1c MFr Bld: 6 % of total Hgb — ABNORMAL HIGH (ref <5.7)
Mean Plasma Glucose: 126 mg/dL
eAG (mmol/L): 7 mmol/L

## 2020-12-10 ENCOUNTER — Encounter: Payer: Self-pay | Admitting: Family

## 2020-12-11 NOTE — Progress Notes (Signed)
Mailed out to pt

## 2020-12-20 MED FILL — PRAVASTATIN SODIUM 80 MG TA: 80 | 90 days supply | Qty: 90 | Fill #1

## 2020-12-20 MED FILL — XARELTO 20 MG TABLET: 20 | 30 days supply | Qty: 30 | Fill #3

## 2021-01-10 ENCOUNTER — Telehealth: Payer: Self-pay | Admitting: Pharmacist

## 2021-01-10 NOTE — Progress Notes (Addendum)
Chronic Care Management Pharmacy Assistant   Name: Robert Bates  MRN: [id number] DOB: [birth year]  Reason for Encounter: Disease State For DM.   Conditions to be addressed/monitored: Hypertension, Hyperlipidemia, Diabetes, COPD, AFib, GERD, BPH, Seizures  Recent office visits: 12/08/20 STARTED Fluticasone Propionate HFA 110 MCG/ACT 1 puff inhalation 2 times daily and Tamsulosin HCL 0.4 mg daily. STOPPED Fluticasone-Salmeterol 250-50 MCG/DOSE.  Recent consult visits: None since 11/10/20  Hospital visits:  None in previous 6 months  Medications: Outpatient Encounter Medications as of 01/10/2021  Medication Sig Note   albuterol (VENTOLIN HFA) 108 (90 Base) MCG/ACT inhaler Inhale 2 puffs into the lungs every 6 (six) hours as needed for wheezing or shortness of breath.    diltiazem (CARTIA XT) 120 MG 24 hr capsule Take 1 capsule (120 mg total) by mouth daily.    fluticasone (FLOVENT HFA) 110 MCG/ACT inhaler Inhale 1 puff into the lungs in the morning and at bedtime.    levETIRAcetam (KEPPRA XR) 500 MG 24 hr tablet Take 2 tablets (1,000 mg total) by mouth daily. 11/10/2020: Only taking #1 daily   omeprazole (PRILOSEC) 40 MG capsule TAKE 1 CAPSULE BY MOUTH DAILY **NEED OFFICE VISIT FOR FURTHER REFILLS**    pravastatin (PRAVACHOL) 80 MG tablet TAKE 1 TABLET (80 MG TOTAL) BY MOUTH AT BEDTIME.    tamsulosin (FLOMAX) 0.4 MG CAPS capsule Take 1 capsule (0.4 mg total) by mouth daily.    XARELTO 20 MG TABS tablet TAKE 1 TABLET (20 MG TOTAL) BY MOUTH DAILY WITH SUPPER.    No facility-administered encounter medications on file as of 01/10/2021.   Recent Relevant Labs: Lab Results  Component Value Date/Time   HGBA1C 6.0 (H) 12/08/2020 04:09 PM   HGBA1C 5.9 03/09/2020 02:29 PM   MICROALBUR 1.0 06/01/2020 10:46 AM    Kidney Function Lab Results  Component Value Date/Time   CREATININE 0.84 12/08/2020 04:09 PM   CREATININE 1.01 06/20/2020 02:05 PM   CREATININE 0.98 04/02/2020 03:03 PM   CREATININE  0.91 11/08/2019 02:55 PM   CREATININE 0.78 07/19/2019 08:41 AM   CREATININE 0.8 06/17/2017 01:15 PM   CREATININE 0.8 08/17/2015 01:19 PM   GFR 80.50 11/08/2019 02:55 PM   GFRNONAA >60 06/20/2020 02:05 PM   GFRAA >60 06/20/2020 02:05 PM    Current antihyperglycemic regimen:  Diet and exercise management    What recent interventions/DTPs have been made to improve glycemic control:  None.  Have there been any recent hospitalizations or ED visits since last visit with CPP? Patient stated no.  Patient reports hypoglycemic symptoms, including Vision changes   Patient denies hyperglycemic symptoms, including none   How often are you checking your blood sugar?  Patient stated he does not check his sugar, he stated it hurts to bad.  What are your blood sugars ranging?  Patient stated he does not check his sugar, he stated it hurts to bad.  During the week, how often does your blood glucose drop below 70?  Patient stated he does not check his sugar, he stated it hurts to bad.  Are you checking your feet daily/regularly?  Patient stated he checks his feet regularly and he sees a podiatrist  Adherence Review: Is the patient currently on a STATIN medication? Yes, Pravastatin 80 mg at bedtime  Is the patient currently on ACE/ARB medication? No.  Does the patient have >5 day gap between last estimated fill dates? Per Misc Rpts Yes.  Star Rating Drugs: Pravastatin 80 mg 08/28/20 90 DS  Patient stated  he does not have any questions or concerns about his medication at this time.  Follow-Up: Pharmacist Review  Robert Bates, RMA Clinical Pharmacist Assistant [phone]   10 minutes spent in review, coordination, and documentation.  Reviewed by: Robert Bates, PharmD Clinical Pharmacist [facility] [phone]

## 2021-01-12 ENCOUNTER — Telehealth: Payer: Self-pay | Admitting: Neurology

## 2021-01-12 ENCOUNTER — Telehealth: Payer: Self-pay | Admitting: Family

## 2021-01-12 MED FILL — CARTIA XT 120 MG CP24: 120 | 90 days supply | Qty: 90 | Fill #1

## 2021-01-12 MED FILL — XARELTO 20 MG TABLET: 20 | 30 days supply | Qty: 30 | Fill #4

## 2021-01-12 MED FILL — OMEPRAZOLE 40 MG CPDR: 40 | 30 days supply | Qty: 30 | Fill #3

## 2021-01-12 MED FILL — TAMSULOSIN HCL 0.4 MG CAP: 0.4 | 30 days supply | Qty: 30 | Fill #1

## 2021-01-12 NOTE — Telephone Encounter (Signed)
I was called by the answering service 01-12-2021, Long encounter : this almost 80 year-old patient was not knowing what medication he takes, who I his provider is and neither what condition is treated, only that he takes "seizure medication" and it makes him ill, that it is a white pill once a day. That's why he called.   I found that he is a patient of Dr Jannifer Franklin at [facility], with 2 medical record numbers and 2 addresses- and he has been seen for a 'seizure', not further differentiated. Has AAA,COPD, lung cancer and a meningeoma, atrial fibrillation,severe hearing loss.  He is supposingly on generic Keppra once a day 2 times 500 mg=1000 mg , last prescribed 08-23-2020 by NP [place]. I see no new prescription or changes in dose- he then explained he only takes ONE 500 mg tab a day - without his prescriber's knowledge.   I urged him top contact Dr Jannifer Franklin [place] ASAP on Monday , but I also stated that Keppra is usually well tolerated and is not likely to make him suddenly sick after so many months (02-18-2020) .  There may be a new medical condition that he needs his PCP to work out? He will need a face to face visit with PCP.   CD

## 2021-01-12 NOTE — Telephone Encounter (Signed)
Patient states seizure medication is too expensive. Wondering if you could prescribe something else

## 2021-01-12 NOTE — Telephone Encounter (Signed)
Patient advised the Robert Bates is prescribed by neurology and he needs to call them to change.

## 2021-01-12 NOTE — Telephone Encounter (Signed)
Please call pt to schedule an appointment with me next week.

## 2021-01-12 NOTE — Telephone Encounter (Signed)
FYI Patient was scheduled for next Friday 01-19-21 (patient requested Friday)

## 2021-01-19 ENCOUNTER — Ambulatory Visit: Payer: Medicare Other | Admitting: Family

## 2021-01-19 ENCOUNTER — Telehealth: Payer: Self-pay

## 2021-01-19 NOTE — Telephone Encounter (Signed)
Caller states has appt and not sure can make it back from [place] by 240; Was unable to leave at 4am d/t gate at work being locked. Delivery truck driver.  Additional Comment May need to reschedule. Will try to call back after 8am.

## 2021-01-19 NOTE — Telephone Encounter (Signed)
Patient called and rescheduled appointment

## 2021-01-30 ENCOUNTER — Telehealth (INDEPENDENT_AMBULATORY_CARE_PROVIDER_SITE_OTHER): Payer: Medicare Other | Admitting: Family

## 2021-01-30 ENCOUNTER — Other Ambulatory Visit: Payer: Self-pay

## 2021-01-30 DIAGNOSIS — N401 Enlarged prostate with lower urinary tract symptoms: Secondary | ICD-10-CM

## 2021-01-30 DIAGNOSIS — R569 Unspecified convulsions: Secondary | ICD-10-CM | POA: Diagnosis not present

## 2021-01-30 NOTE — Progress Notes (Signed)
Virtual Visit via Telephone Note  I connected with Robert Bates on 01/30/21 at  4:20 PM EDT by telephone and verified that I am speaking with the correct person using two identifiers.  Location: Patient: in his parked car Provider: work   I discussed the limitations, risks, security and privacy concerns of performing an evaluation and management service by telephone and the availability of in person appointments. I also discussed with the patient that there may be a patient responsible charge related to this service. The patient expressed understanding and agreed to proceed.   History of Present Illness:  Patient presents today for a telephone visit.  He wishes to discuss his Keppra.  States that he took his last Keppra dose on Sunday. Notes that he was only taking 1 tab a day due to the cost and side effects. He believes that the keppra was causing him nausea.   He noted that the nausea was only sometimes, not every day. Denies current nausea.     Takes flomax and notes increased frequency. Admits that he does not urinate well when he is not on flomax.     Observations/Objective:   Gen: Awake, alert, no acute distress Resp: Breathing sounds even and non-labored Psych: calm/pleasant demeanor Neuro: Alert and Oriented x 3, speech is clear.   Assessment and Plan:  Seizure disorder- I advised pt to reach out to his neurology group tomorrow AM to discuss alternative medication that may be less expensive. It is not clear to me if the nausea was a side effect or not from the [place]. He is advised to come see me in the office if he develops recurrent issues with nausea.   BPH- He is taking flomax in the AM,  I recommended that he try taking it at bedtime to see if his frequency improves. If not, will plan referral back to urology.    Follow Up Instructions:    I discussed the assessment and treatment plan with the patient. The patient was provided an opportunity to ask questions and  all were answered. The patient agreed with the plan and demonstrated an understanding of the instructions.   The patient was advised to call back or seek an in-person evaluation if the symptoms worsen or if the condition fails to improve as anticipated.  I provided 12 minutes of non-face-to-face time during this encounter.   Nance Pear, NP

## 2021-02-08 ENCOUNTER — Ambulatory Visit (INDEPENDENT_AMBULATORY_CARE_PROVIDER_SITE_OTHER): Payer: Medicare Other | Admitting: Pharmacist

## 2021-02-08 DIAGNOSIS — I480 Paroxysmal atrial fibrillation: Secondary | ICD-10-CM | POA: Diagnosis not present

## 2021-02-08 DIAGNOSIS — J449 Chronic obstructive pulmonary disease, unspecified: Secondary | ICD-10-CM | POA: Diagnosis not present

## 2021-02-08 DIAGNOSIS — I10 Essential (primary) hypertension: Secondary | ICD-10-CM | POA: Diagnosis not present

## 2021-02-08 DIAGNOSIS — R569 Unspecified convulsions: Secondary | ICD-10-CM

## 2021-02-08 DIAGNOSIS — K219 Gastro-esophageal reflux disease without esophagitis: Secondary | ICD-10-CM

## 2021-02-08 DIAGNOSIS — E119 Type 2 diabetes mellitus without complications: Secondary | ICD-10-CM

## 2021-02-08 DIAGNOSIS — E785 Hyperlipidemia, unspecified: Secondary | ICD-10-CM | POA: Diagnosis not present

## 2021-02-08 DIAGNOSIS — I700 Atherosclerosis of aorta: Secondary | ICD-10-CM

## 2021-02-08 DIAGNOSIS — N401 Enlarged prostate with lower urinary tract symptoms: Secondary | ICD-10-CM

## 2021-02-08 NOTE — Chronic Care Management (AMB) (Signed)
Chronic Care Management Pharmacy Note  02/08/2021 Name:  Robert Bates MRN:  [id number] DOB:  23-Apr-1941  Subjective: Robert Bates is an 80 y.o. year old male who is a primary patient of [name], NP.  The CCM team was consulted for assistance with disease management and care coordination needs.    Engaged with patient by telephone for follow up visit in response to provider referral for pharmacy case management and/or care coordination services.   Consent to Services:  The patient was given information about Chronic Care Management services, agreed to services, and gave verbal consent prior to initiation of services.  Please see initial visit note for detailed documentation.   Patient Care Team: [name], NP as PCP - General Nahser, Wonda Cheng, MD as PCP - Cardiology (Cardiology) Elsie Stain, MD as Attending Physician (Pulmonary Disease) Virgina Evener, OD (Optometry) Cherre Robins, PharmD (Pharmacist)  Recent office visits: 01/30/2021 - PCP Inda Castle) Video visit to discuss Keppra and BPH; patient instructed to take tamsulosin 0.$RemoveBeforeDEI'4mg'eSPVYhgqjhyngQwT$  daily in evening; Instructed to reach out to neurology regarding taking Keppra  12/08/2020 - PCP Inda Castle) F/U chronic conditions; Restart flomax 0.$RemoveBeforeDE'4mg'WnzmPjbCJmYElSx$  once daily. No other med changes noted  Recent consult visits: 09/08/2020 - Cardio (Nahser) F/u Afib, HTN, AAA, PVD; Noted to be in NSR; no med changes.    Hospital visits: None in previous 6 months  Objective:  Lab Results  Component Value Date   CREATININE 0.84 12/08/2020   CREATININE 1.01 06/20/2020   CREATININE 0.98 04/02/2020    Lab Results  Component Value Date   HGBA1C 6.0 (H) 12/08/2020   Last diabetic Eye exam: No results found for: HMDIABEYEEXA  Last diabetic Foot exam: No results found for: HMDIABFOOTEX      Component Value Date/Time   CHOL 157 03/09/2020 1429   TRIG 154.0 (H) 03/09/2020 1429   HDL 50.10 03/09/2020 1429   CHOLHDL 3  03/09/2020 1429   VLDL 30.8 03/09/2020 1429   LDLCALC 76 03/09/2020 1429   LDLDIRECT 142.2 12/26/2008 1032    Hepatic Function Latest Ref Rng & Units 12/08/2020 06/20/2020 11/08/2019  Total Protein 6.1 - 8.1 g/dL 6.1 5.9(L) 6.2  Albumin 3.5 - 5.0 g/dL - 4.0 3.9  AST 10 - 35 U/L 13 10(L) 13  ALT 9 - 46 U/L $Remo'12 7 13  'alKYL$ Alk Phosphatase 38 - 126 U/L - 71 87  Total Bilirubin 0.2 - 1.2 mg/dL 0.4 0.4 0.5  Bilirubin, Direct 0.0 - 0.3 mg/dL - - -    Lab Results  Component Value Date/Time   TSH 1.31 11/08/2019 02:55 PM   TSH 1.53 04/27/2018 03:09 PM    CBC Latest Ref Rng & Units 06/20/2020 04/02/2020 11/08/2019  WBC 4.0 - 10.5 K/uL 8.2 6.9 8.3  Hemoglobin 13.0 - 17.0 g/dL 14.0 14.2 15.0  Hematocrit 39.0 - 52.0 % 42.6 43.1 45.5  Platelets 150 - 400 K/uL 229 216 258.0    No results found for: VD25OH  Clinical ASCVD: Yes  The 10-year ASCVD risk score Mikey Bussing DC Jr., et al., 2013) is: 55.3%   Values used to calculate the score:     Age: 6 years     Sex: Male     Is Non-Hispanic African American: No     Diabetic: Yes     Tobacco smoker: Yes     Systolic Blood Pressure: 354 mmHg     Is BP treated: Yes     HDL Cholesterol: 50.1 mg/dL     Total Cholesterol: 157  mg/dL    Other: CHADSVasc = 5  Social History   Tobacco Use  Smoking Status Former Smoker  . Packs/day: 1.00  . Years: 57.00  . Pack years: 57.00  . Quit date: 08/08/2015  . Years since quitting: 5.5  Smokeless Tobacco Former Systems developer  . Types: Chew  . Quit date: 11/04/1958  Tobacco Comment   occasional use sneaks around   BP Readings from Last 3 Encounters:  12/08/20 129/68  11/06/20 123/79  09/08/20 108/66   Pulse Readings from Last 3 Encounters:  12/08/20 66  11/06/20 65  09/08/20 72   Wt Readings from Last 3 Encounters:  12/08/20 168 lb 6.4 oz (76.4 kg)  11/06/20 174 lb (78.9 kg)  09/08/20 172 lb 12.8 oz (78.4 kg)    Assessment: Review of patient past medical history, allergies, medications, health status, including  review of consultants reports, laboratory and other test data, was performed as part of comprehensive evaluation and provision of chronic care management services.   SDOH:  (Social Determinants of Health) assessments and interventions performed:  SDOH Interventions   Flowsheet Row Most Recent Value  SDOH Interventions   Financial Strain Interventions Other (Comment)  [discussion with neurologist office about cost of [place] [facility]  Allergies  Allergen Reactions  . Iodine Other (See Comments)    neck swells  . Iohexol Swelling    Neck and gland swelling per patient.    Medications Reviewed Today    Reviewed by Cherre Robins, PharmD (Pharmacist) on 02/08/21 at 89  Med List Status: <None>  Medication Order Taking? Sig Documenting Provider Last Dose Status Informant  albuterol (VENTOLIN HFA) 108 (90 Base) MCG/ACT inhaler [id number] Yes Inhale 2 puffs into the lungs every 6 (six) hours as needed for wheezing or shortness of breath. [name], NP Taking Active   diltiazem (CARTIA XT) 120 MG 24 hr capsule [id number] Yes Take 1 capsule (120 mg total) by mouth daily. Nahser, Wonda Cheng, MD Taking Active   fluticasone [facility] HFA) 110 MCG/ACT inhaler [id number] Yes Inhale 1 puff into the lungs in the morning and at bedtime. [name], NP Taking Active   levETIRAcetam (KEPPRA XR) 500 MG 24 hr tablet [id number] No Take 2 tablets (1,000 mg total) by mouth daily.  Patient not taking: Reported on 02/08/2021   Ward Givens, NP Not Taking Active            Med Note De [place]   Fri Nov 10, 2020 11:22 AM) Only taking #1 daily  omeprazole (PRILOSEC) 40 MG capsule [id number] Yes TAKE 1 CAPSULE BY MOUTH DAILY **NEED OFFICE VISIT FOR FURTHER REFILLSDebbrah Alar, NP Taking Active   pravastatin (PRAVACHOL) 80 MG tablet [id number] Yes TAKE 1 TABLET (80 MG TOTAL) BY MOUTH AT BEDTIME. [name], NP Taking Active   tamsulosin [facility]) 0.4 MG CAPS capsule  [id number] Yes Take 1 capsule (0.4 mg total) by mouth daily. [name], NP Taking Active   XARELTO 20 MG TABS tablet [id number] Yes TAKE 1 TABLET (20 MG TOTAL) BY MOUTH DAILY WITH SUPPER. Leanor Kail, PA Taking Active           Patient Active Problem List   Diagnosis Date Noted  . Left leg pain 04/07/2020  . Benign prostatic hyperplasia with nocturia 03/08/2020  . Seizures ([place]) 02/18/2020  . PAF (paroxysmal atrial fibrillation) ([place]) 08/15/2017  . Diabetes type 2, controlled ([place]) 07/31/2017  . Cholecystitis 07/21/2017  . COPD GOLD II  04/03/2017  .  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 Lung cancer ([place]) 11/07/2014  . 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 HTN (hypertension) 04/29/2014  . Meningioma ([place]) 10/25/2013  . Low back pain 10/25/2013  . 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 KERATOSIS 10/09/2010  . SCIATICA, RIGHT 10/09/2010  . UNSPECIFIED HEARING LOSS 05/21/2010  . Hyperlipidemia 05/14/2010  . 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[place]) 01/29/2010  . LIPOMA 11/17/2009  . MICROSCOPIC HEMATURIA 08/11/2008  . GERD 07/26/2008  . DERMOID CYST 07/25/2008    Immunization History  Administered Date(s) Administered  . Fluad Quad(high Dose 65+) 07/23/2019, 09/04/2020  . Influenza Split 08/02/2011, 08/18/2012  . Influenza Whole 08/17/2008  . Influenza, High Dose Seasonal PF 08/09/2015, 09/30/2016, 07/30/2017, 08/07/2018  . Influenza,inj,Quad PF,6+ Mos 07/27/2013, 08/09/2014  . PFIZER(Purple Top)SARS-COV-2 Vaccination 01/28/2020, 02/21/2020, 09/04/2020  . Pneumococcal Conjugate-13 08/09/2014  . Pneumococcal Polysaccharide-23 05/14/2010  . Td 07/25/2008    Conditions to be addressed/monitored: Atrial Fibrillation, HTN, HLD, DMII, Pulmonary Disease and BPH; seizures; h/o lung cancer and meningioma; GERD; aortic aneyrysm  Care Plan : General Pharmacy (Adult)  Updates made by  Cherre Robins, PHARMD since 02/08/2021 12:00 AM    Problem: Management of chronic medicaiton conditions and medication: Atrial Fibrillation, HTN, HLD, DMII, Pulmonary Disease and BPH; seizures; h/o lung cancer and meningioma; GERD; aortic aneyrysm   Priority: High  Onset Date: 02/08/2021  Note:   Current Barriers:  [name] Management of chronic medical conditions and medication managemeen  Pharmacist Clinical Goal(s):  [name] Over the next 180 days, patient will achieve adherence to monitoring guidelines and medication adherence to achieve therapeutic efficacy . contact provider office for questions/concerns as evidenced notation of same in electronic health record through collaboration with PharmD and provider.   Interventions: . 1:1 collaboration with [name], NP regarding development and update of comprehensive plan of care as evidenced by provider attestation and co-signature . Inter-disciplinary care team collaboration (see longitudinal plan of care) . Comprehensive medication review performed; medication list updated in electronic medical record  Diabetes: . Controlled; current treatment: prescribed diet and exercise but patient admits that he gets little exercise and does not follow specific diet.   . Current glucose readings: does not check BG at home . 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  . Educated on limiting high CHO foods; recommended increase non starchy vegetables Counseled on increasing exercise like walking around parking lot when he stops at truck stop; He does have an aneurysm to I advised against strenuous exercise.   Hypertension: . Controlled with current treatment: diltiazem 120mg  daily  .       Patient checks BP at home infrequently - patient was not able to provide reading during out phone visit.  Patient has failed these meds in the past: None noted  . Denies/reports hypotensive/hypertensive symptoms - he was having dizziness but  attributed this to [place] / levetiracetam . Educated on BP goals Recommended continue current antiHTN therapy ,   Hyperlipidemia: . Controlled; current treatment:pravastatin 80mg  at bedtime;   . Current dietary patterns: no following any specific dietary restrictions . 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 Recommended try to increase intake of non starchy vegetables Recommended continue current therapy  Chronic Obstructive Pulmonary Disease: . controlled; current treatment: albuterol as rescue inhaler and Flovent 116mcg twice a day (however patient states  he prefers Symbicort)  . GOLD Classification: 2 (FEV1 50-79%) . Most recent Pulmonary Function Testing: 05/19/2017 - spirometry FVC 2.84 (73%) FEV1 1.74 (62%) FEV1/FVC 61% Current COPD Classification:  A (low sx, <2 exacerbations/yr) . Recommended continue current therapy Collaborated with PCP regarding applying for PAP for Symbicort since pt feels it works better than Flovent but will defer tha tdecision to PCP;   Atrial Fibrillation: . Controlled; current rate/rhythm controled with diltiazem 120mg  daily ; anticoagulant treatment: Xarelto 20mg  daily with PM meal . CHADS2VASc score: 5 . Recommended continue current regimen Collaborated with PCP and patient to apply for PAP for Xarelto (patient will have to meet OOP spend and may not qualify yet)   GERD:   Patient is currently controlled on the following medications:   Omeprazole 40mg  daily  Patient has failed these meds in past: None noted   Triggers: laying down after eating  Recommend continue current medications   Seizures:   Patient is currently controlled on the following medications:  Levetiracetam XR 500mg  #2 daily (not taking)   Patient stopped levetiracetam due to cost of $47/month, because he thought it was causing nausea and because he does not feel that he actually had a seizure.   He  has f/u with neurology 02/26/21.   I will send message to neuro so they are aware he has not been taking levetiracetam and why.   Patient was advised that he should discuss issue with provider before stopping medications.    Patient Goals/Self-Care Activities . Over the next 180 days, patient will:  take medications as prescribed and collaborate with provider on medication access solutions  Follow Up Plan: Telephone follow up appointment with care management team member scheduled for:  3 to 4 months               Medication Assistance: Application for Xarelto and either Symbicort or Flovent  medication assistance program mailed to patient to complete. Anticipated assistance start date 04/04/2021.  See plan of care for additional detail.  Patient's preferred pharmacy is:  [facility] [address], [place] [address] Phone: [phone] Fax: [phone]  [facility] [place], [place] [address] [place] [address] Phone: [phone] Fax: [phone]  [place], [address], [address] [place], [place] [address] [place] [address] Phone: [phone] Fax: [phone]   Follow Up:  Patient agrees to Care Plan and Follow-up.  Plan: Telephone follow up appointment with care management team member scheduled for:  3 to 4 months  Cherre Robins, PharmD Clinical Pharmacist [place] [facility]

## 2021-02-08 NOTE — Patient Instructions (Addendum)
Visit Information It was great to meet you today Robert Bates.  Please find enclosed with this care plan applications for medication assistance programs for Symbicort or Flovent and Xarelto.  Please complete the patient portion and provide verification of your yearly income. Please call if you have questions or concerns.   Robert Bates    PATIENT GOALS: Goals Addressed            This Visit's Progress   . Chronic Care Management Pharmacy Care Plan       CARE PLAN ENTRY (see longitudinal plan of care for additional care plan information)  Current Barriers:  . Chronic Disease Management support, education, and care coordination needs related to Hypertension, Hyperlipidemia, Diabetes, COPD, AFib, GERD, BPH, Seizures   Hypertension BP Readings from Last 3 Encounters:  12/08/20 129/68  11/06/20 123/79  09/08/20 108/66   . Pharmacist Clinical Goal(s): o Over the next 90 days, patient will work with PharmD and providers to maintain BP goal <140/90 . Current regimen:  o Diltiazem XT 120mg  daily . Interventions: o Discussed BP goal . Patient self care activities - Over the next 90 days, patient will: o Maintain hypertension medication regimen.    Hyperlipidemia Lab Results  Component Value Date/Time   LDLCALC 76 03/09/2020 02:29 PM   LDLDIRECT 142.2 12/26/2008 10:32 AM   . Pharmacist Clinical Goal(s): o Over the next 90 days, patient will work with PharmD and providers to maintain LDL goal < 100 . Current regimen:  o Pravastatin 80mg  daily . Interventions: o Discussed LDL goal . Patient self care activities - Over the next 90 days, patient will: o Maintain cholesterol medication regimen.   Diabetes Lab Results  Component Value Date/Time   HGBA1C 6.0 (H) 12/08/2020 04:09 PM   HGBA1C 5.9 03/09/2020 02:29 PM   . Pharmacist Clinical Goal(s): o Over the next 90 days, patient will work with PharmD and providers to maintain A1c goal <7% . Current regimen:  o Diet and exercise  recommeded . Interventions: o Discussed A1c goal . Patient self care activities - Over the next 90 days, patient will: o Maintain A1c <7% o Limit intake of sugar, potatoes, pasta, rice and bread.   COPD . Pharmacist Clinical Goal(s) o Over the next 90 days, patient will work with PharmD and providers to reduce symptoms associated with COPD and reduce barriers to access of preferred medication regimens . Current regimen:   Albuterol HFA 2 puffs every 6 hours as needed for wheezing  Flovent HFA 144mcg - inhaler 1 puff each morning and 1 puff each evening . Interventions: o Discussed patient's preference for Symbicort device vs Flovent device o Coordinate with PCP to identify optimal inhaler therapy for patient based on his preferances, insurane formulary and patient assistance if available.  o Mailed patient assistance applications for [place] and [place] and [place] - will coordinate with provider about which inhaler she prefers for patient.  . Patient self care activities - Over the next 90 days, patient will: o Complete applications for Symbicort and Glaxo o Maintain medication regimen for COPD  AFib . Pharmacist Clinical Goal(s) o Over the next 90 days, patient will work with PharmD and providers to reduce risk of stroke associated with Afib and reduce barriers to preferred medication regimen . Current regimen:   Diltiazem XT 120mg  daily  Xarelto 20mg  daily . Interventions: o Mailed patient PAP for Xarelto . Patient self care activities - Over the next 90 days, patient will: o Complete PAP for Xarelto o Maintain  Afib medication regimen  Seizures . Pharmacist Clinical Goal(s) o Over the next 90 days, patient will work with PharmD and providers to remain seizure free . Current regimen:  o Levetiracetam 500mg  ER - take 2 tablets daily (stopped due to cost and made patient feel sick) . Interventions: o Check last cost of levetiracetam ER with pharmacy and is $47/month o Reviewed  pharmacy benefits / formulary (generic levetiracetam = $8 but generic levetiracetam ER = $47) o Discussed importance of taking levetiracetam with patient o Recommended he discuss medication, side effects and possible alternatives with neurology office. Will also coordinate with them as needed to find acceptable alternative for patient . Patient self care activities - Over the next 90 days, patient will: o Discuss your concerns with levetiracetam with Elberta Spaniel at next appt 02/26/21  Medication management . Pharmacist Clinical Goal(s): o Over the next 90 days, patient will work with PharmD and providers to maintain optimal medication adherence . Current pharmacy: [facility] . Interventions o Comprehensive medication review performed. o Continue current medication management strategy . Patient self care activities - Over the next 90 days, patient will: o Focus on medication adherence by filling and taking medications appropriately  o Take medications as prescribed o Report any questions or concerns to PharmD and/or provider(s)  Initial goal documentation        The patient verbalized understanding of instructions, educational materials, and care plan provided today and agreed to receive a mailed copy of patient instructions, educational materials, and care plan.   Telephone follow up appointment with care management team member scheduled for: 1 month   Cherre Robins, PharmD Clinical Pharmacist [place] [facility] [phone]

## 2021-02-13 ENCOUNTER — Telehealth: Payer: Self-pay | Admitting: *Deleted

## 2021-02-13 NOTE — Telephone Encounter (Signed)
-----   Message from Ward Givens, NP sent at 02/08/2021  3:43 PM EDT ----- Regarding: FW: Medication Issue Can you reach out to this patient? ----- Message ----- From: Cherre Robins, PharmD Sent: 02/08/2021   3:30 PM EDT To: Ward Givens, NP Subject: Medication Issue                               Hi Megan,  I was following up with Robert Bates regarding chronic care management and he reported that he stopped taking Keppra ER / Levetiracetam due to cost (the generic ER is tier 3 or $47/month; the reg release was $8/month), suspected side effects and patient feels that he does not need to take any longer.   He has a follow up with you 02/26/21.  Just wanted to update prior to his upcoming appt.   Cherre Robins, PharmD Clinical Pharmacist [facility] [phone]

## 2021-02-13 NOTE — Telephone Encounter (Signed)
Called Robert Bates and LMVM for Robert Bates that wanted to speak to him about his medication, KEPPRA, and he also has appt 02-26-21 with Jinny Blossom, NP can discuss then as well.  Please call me back.

## 2021-02-14 NOTE — Telephone Encounter (Signed)
LMVm for pt to return call re: medication for seizures.

## 2021-02-15 ENCOUNTER — Ambulatory Visit: Payer: Medicare Other | Admitting: Pharmacist

## 2021-02-15 DIAGNOSIS — I480 Paroxysmal atrial fibrillation: Secondary | ICD-10-CM

## 2021-02-15 DIAGNOSIS — I10 Essential (primary) hypertension: Secondary | ICD-10-CM | POA: Diagnosis not present

## 2021-02-15 DIAGNOSIS — R569 Unspecified convulsions: Secondary | ICD-10-CM

## 2021-02-15 DIAGNOSIS — E785 Hyperlipidemia, unspecified: Secondary | ICD-10-CM | POA: Diagnosis not present

## 2021-02-15 DIAGNOSIS — E119 Type 2 diabetes mellitus without complications: Secondary | ICD-10-CM

## 2021-02-15 DIAGNOSIS — J449 Chronic obstructive pulmonary disease, unspecified: Secondary | ICD-10-CM | POA: Diagnosis not present

## 2021-02-15 DIAGNOSIS — N401 Enlarged prostate with lower urinary tract symptoms: Secondary | ICD-10-CM

## 2021-02-15 NOTE — Patient Instructions (Signed)
Visit Information  PATIENT GOALS: Goals Addressed            This Visit's Progress   . Chronic Care Management Pharmacy Care Plan   On track    CARE PLAN ENTRY (see longitudinal plan of care for additional care plan information)  Current Barriers:  . Chronic Disease Management support, education, and care coordination needs related to Hypertension, Hyperlipidemia, Diabetes, COPD, AFib, GERD, BPH, Seizures   Hypertension BP Readings from Last 3 Encounters:  12/08/20 129/68  11/06/20 123/79  09/08/20 108/66   . Pharmacist Clinical Goal(s): o Over the next 90 days, patient will work with PharmD and providers to maintain BP goal <140/90 . Current regimen:  o Diltiazem XT 120mg  daily . Interventions: o Discussed BP goal . Patient self care activities - Over the next 90 days, patient will: o Maintain hypertension medication regimen.    Hyperlipidemia Lab Results  Component Value Date/Time   LDLCALC 76 03/09/2020 02:29 PM   LDLDIRECT 142.2 12/26/2008 10:32 AM   . Pharmacist Clinical Goal(s): o Over the next 90 days, patient will work with PharmD and providers to maintain LDL goal < 100 . Current regimen:  o Pravastatin 80mg  daily . Interventions: o Discussed LDL goal . Patient self care activities - Over the next 90 days, patient will: o Maintain cholesterol medication regimen.   Diabetes Lab Results  Component Value Date/Time   HGBA1C 6.0 (H) 12/08/2020 04:09 PM   HGBA1C 5.9 03/09/2020 02:29 PM   . Pharmacist Clinical Goal(s): o Over the next 90 days, patient will work with PharmD and providers to maintain A1c goal <7% . Current regimen:  o Diet and exercise recommeded . Interventions: o Discussed A1c goal . Patient self care activities - Over the next 90 days, patient will: o Maintain A1c <7% o Limit intake of sugar, potatoes, pasta, rice and bread.   COPD . Pharmacist Clinical Goal(s) o Over the next 90 days, patient will work with PharmD and providers to  reduce symptoms associated with COPD and reduce barriers to access of preferred medication regimens . Current regimen:   Albuterol HFA 2 puffs every 6 hours as needed for wheezing  Flovent HFA 173mcg - inhaler 1 puff each morning and 1 puff each evening . Interventions: o Discussed patient's preference for Symbicort device vs Flovent device o Coordinate with PCP to identify optimal inhaler therapy for patient based on his preferances, insurane formulary and patient assistance if available.  o Mailed patient assistance applications for [place] and [place] and [place] - will coordinate with provider about which inhaler she prefers for patient.  . Patient self care activities - Over the next 90 days, patient will: o Complete applications for Symbicort and Glaxo o Maintain medication regimen for COPD  AFib . Pharmacist Clinical Goal(s) o Over the next 90 days, patient will work with PharmD and providers to reduce risk of stroke associated with Afib and reduce barriers to preferred medication regimen . Current regimen:   Diltiazem XT 120mg  daily  Xarelto 20mg  daily . Interventions: o Mailed patient PAP for Xarelto . Patient self care activities - Over the next 90 days, patient will: o Complete PAP for Xarelto o Maintain Afib medication regimen  Seizures . Pharmacist Clinical Goal(s) o Over the next 90 days, patient will work with PharmD and providers to remain seizure free . Current regimen:  o Levetiracetam 500mg  ER - take 2 tablets daily (stopped due to cost and made patient feel sick) . Interventions: o Check last  cost of levetiracetam ER with pharmacy and is $47/month o Reviewed pharmacy benefits / formulary (generic levetiracetam = $8 but generic levetiracetam ER = $47) o Discussed importance of taking levetiracetam with patient o Recommended he discuss medication, side effects and possible alternatives with neurology office. Will also coordinate with them as needed to find acceptable  alternative for patient . Patient self care activities - Over the next 90 days, patient will: o Discuss your concerns with levetiracetam with Elberta Spaniel at next appt 02/26/21  Medication management . Pharmacist Clinical Goal(s): o Over the next 90 days, patient will work with PharmD and providers to maintain optimal medication adherence . Current pharmacy: [facility] . Interventions o Comprehensive medication review performed. o Continue current medication management strategy . Patient self care activities - Over the next 90 days, patient will: o Focus on medication adherence by filling and taking medications appropriately  o Take medications as prescribed o Report any questions or concerns to PharmD and/or provider(s)  Initial goal documentation        The patient verbalized understanding of instructions, educational materials, and care plan provided today and declined offer to receive copy of patient instructions, educational materials, and care plan.   Telephone follow up appointment with care management team member scheduled for: 1 month  Cherre Robins, PharmD Clinical Pharmacist [place] [phone]

## 2021-02-15 NOTE — Chronic Care Management (AMB) (Signed)
Chronic Care Management Pharmacy Note  02/15/2021 Name:  Robert Bates MRN:  [id number] DOB:  June 07, 1941  Subjective: Robert Bates is an 80 y.o. year old male who is a primary patient of [name], NP.  The CCM team was consulted for assistance with disease management and care coordination needs.    Collaboration with provider and patient assistance program for follow up medication access and management in response to provider referral for pharmacy case management and/or care coordination services.   Consent to Services:  The patient was given information about Chronic Care Management services, agreed to services, and gave verbal consent prior to initiation of services.  Please see initial visit note for detailed documentation.   Patient Care Team: [name], NP as PCP - General Nahser, Wonda Cheng, MD as PCP - Cardiology (Cardiology) Elsie Stain, MD as Attending Physician (Pulmonary Disease) Virgina Evener, OD (Optometry) Cherre Robins, PharmD (Pharmacist)  Recent office visits: 01/30/2021 - PCP Inda Castle) Video visit to discuss Keppra and BPH; patient instructed to take tamsulosin 0.$RemoveBeforeDEI'4mg'gKUgYbKfbkgTQhDZ$  daily in evening; Instructed to reach out to neurology regarding taking Keppra  12/08/2020 - PCP Inda Castle) F/U chronic conditions; Restart flomax 0.$RemoveBeforeDE'4mg'NSAIsqvgkfqRHGH$  once daily. No other med changes noted  Recent consult visits: 09/08/2020 - Cardio (Nahser) F/u Afib, HTN, AAA, PVD; Noted to be in NSR; no med changes.  Hospital visits: None in previous 6 months  Objective:  Lab Results  Component Value Date   CREATININE 0.84 12/08/2020   CREATININE 1.01 06/20/2020   CREATININE 0.98 04/02/2020    Lab Results  Component Value Date   HGBA1C 6.0 (H) 12/08/2020   Last diabetic Eye exam: No results found for: HMDIABEYEEXA  Last diabetic Foot exam: No results found for: HMDIABFOOTEX      Component Value Date/Time   CHOL 157 03/09/2020 1429   TRIG 154.0 (H) 03/09/2020  1429   HDL 50.10 03/09/2020 1429   CHOLHDL 3 03/09/2020 1429   VLDL 30.8 03/09/2020 1429   LDLCALC 76 03/09/2020 1429   LDLDIRECT 142.2 12/26/2008 1032    Hepatic Function Latest Ref Rng & Units 12/08/2020 06/20/2020 11/08/2019  Total Protein 6.1 - 8.1 g/dL 6.1 5.9(L) 6.2  Albumin 3.5 - 5.0 g/dL - 4.0 3.9  AST 10 - 35 U/L 13 10(L) 13  ALT 9 - 46 U/L $Remo'12 7 13  'oYVEX$ Alk Phosphatase 38 - 126 U/L - 71 87  Total Bilirubin 0.2 - 1.2 mg/dL 0.4 0.4 0.5  Bilirubin, Direct 0.0 - 0.3 mg/dL - - -    Lab Results  Component Value Date/Time   TSH 1.31 11/08/2019 02:55 PM   TSH 1.53 04/27/2018 03:09 PM    CBC Latest Ref Rng & Units 06/20/2020 04/02/2020 11/08/2019  WBC 4.0 - 10.5 K/uL 8.2 6.9 8.3  Hemoglobin 13.0 - 17.0 g/dL 14.0 14.2 15.0  Hematocrit 39.0 - 52.0 % 42.6 43.1 45.5  Platelets 150 - 400 K/uL 229 216 258.0    No results found for: VD25OH  Clinical ASCVD: Yes  The 10-year ASCVD risk score Mikey Bussing DC Jr., et al., 2013) is: 55.3%   Values used to calculate the score:     Age: 39 years     Sex: Male     Is Non-Hispanic African American: No     Diabetic: Yes     Tobacco smoker: Yes     Systolic Blood Pressure: 149 mmHg     Is BP treated: Yes     HDL Cholesterol: 50.1 mg/dL  Total Cholesterol: 157 mg/dL    Other: (CHADS2VASc if Afib, PHQ9 if depression, MMRC or CAT for COPD, ACT, DEXA)  Social History   Tobacco Use  Smoking Status Former Smoker  . Packs/day: 1.00  . Years: 57.00  . Pack years: 57.00  . Quit date: 08/08/2015  . Years since quitting: 5.5  Smokeless Tobacco Former Systems developer  . Types: Chew  . Quit date: 11/04/1958  Tobacco Comment   occasional use sneaks around   BP Readings from Last 3 Encounters:  12/08/20 129/68  11/06/20 123/79  09/08/20 108/66   Pulse Readings from Last 3 Encounters:  12/08/20 66  11/06/20 65  09/08/20 72   Wt Readings from Last 3 Encounters:  12/08/20 168 lb 6.4 oz (76.4 kg)  11/06/20 174 lb (78.9 kg)  09/08/20 172 lb 12.8 oz (78.4 kg)     Assessment: Review of patient past medical history, allergies, medications, health status, including review of consultants reports, laboratory and other test data, was performed as part of comprehensive evaluation and provision of chronic care management services.   SDOH:  (Social Determinants of Health) assessments and interventions performed:    CCM Care Plan  Allergies  Allergen Reactions  . Iodine Other (See Comments)    neck swells  . Iohexol Swelling    Neck and gland swelling per patient.    Medications Reviewed Today    Reviewed by Cherre Robins, PharmD (Pharmacist) on 02/15/21 at 56  Med List Status: <None>  Medication Order Taking? Sig Documenting Provider Last Dose Status Informant  albuterol (VENTOLIN HFA) 108 (90 Base) MCG/ACT inhaler [id number] Yes Inhale 2 puffs into the lungs every 6 (six) hours as needed for wheezing or shortness of breath. [name], NP Taking Active   diltiazem (CARTIA XT) 120 MG 24 hr capsule [id number] Yes Take 1 capsule (120 mg total) by mouth daily. Nahser, Wonda Cheng, MD Taking Active   fluticasone [facility] HFA) 110 MCG/ACT inhaler [id number] Yes Inhale 1 puff into the lungs in the morning and at bedtime. [name], NP Taking Active   levETIRAcetam (KEPPRA XR) 500 MG 24 hr tablet [id number] Yes Take 2 tablets (1,000 mg total) by mouth daily. Ward Givens, NP Taking Active            Med Note De [place]   Fri Nov 10, 2020 11:22 AM) Only taking #1 daily  omeprazole (PRILOSEC) 40 MG capsule [id number] Yes TAKE 1 CAPSULE BY MOUTH DAILY **NEED OFFICE VISIT FOR FURTHER REFILLSDebbrah Alar, NP Taking Active   pravastatin (PRAVACHOL) 80 MG tablet [id number] Yes TAKE 1 TABLET (80 MG TOTAL) BY MOUTH AT BEDTIME. [name], NP Taking Active   tamsulosin [facility]) 0.4 MG CAPS capsule [id number] Yes Take 1 capsule (0.4 mg total) by mouth daily. [name], NP Taking Active   XARELTO 20 MG TABS tablet [id number]  Yes TAKE 1 TABLET (20 MG TOTAL) BY MOUTH DAILY WITH SUPPER. Leanor Kail, PA Taking Active           Patient Active Problem List   Diagnosis Date Noted  . Left leg pain 04/07/2020  . Benign prostatic hyperplasia with nocturia 03/08/2020  . Seizures ([place]) 02/18/2020  . PAF (paroxysmal atrial fibrillation) ([place]) 08/15/2017  . Diabetes type 2, controlled ([place]) 07/31/2017  . Cholecystitis 07/21/2017  . COPD GOLD II  04/03/2017  . 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 Lung cancer ([place]) 11/07/2014  . 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 HTN (hypertension) 04/29/2014  .  Meningioma ([place]) 10/25/2013  . Low back pain 10/25/2013  . 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 KERATOSIS 10/09/2010  . SCIATICA, RIGHT 10/09/2010  . UNSPECIFIED HEARING LOSS 05/21/2010  . Hyperlipidemia 05/14/2010  . 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[place]) 01/29/2010  . LIPOMA 11/17/2009  . MICROSCOPIC HEMATURIA 08/11/2008  . GERD 07/26/2008  . DERMOID CYST 07/25/2008    Immunization History  Administered Date(s) Administered  . Fluad Quad(high Dose 65+) 07/23/2019, 09/04/2020  . Influenza Split 08/02/2011, 08/18/2012  . Influenza Whole 08/17/2008  . Influenza, High Dose Seasonal PF 08/09/2015, 09/30/2016, 07/30/2017, 08/07/2018  . Influenza,inj,Quad PF,6+ Mos 07/27/2013, 08/09/2014  . PFIZER(Purple Top)SARS-COV-2 Vaccination 01/28/2020, 02/21/2020, 09/04/2020  . Pneumococcal Conjugate-13 08/09/2014  . Pneumococcal Polysaccharide-23 05/14/2010  . Td 07/25/2008    Conditions to be addressed/monitored: Atrial Fibrillation, HTN, HLD, COPD, DMII and BPH; seizures; GERD; aneurysm  Care Plan : General Pharmacy (Adult)  Updates made by Cherre Robins, PHARMD since 02/15/2021 12:00 AM    Problem: Management of chronic medicaiton conditions and medication: Atrial Fibrillation, HTN, HLD, type 2 DM, Pulmonary Disease and BPH;  seizures; h/o lung cancer and meningioma; GERD; aortic aneurysm   Priority: High  Onset Date: 02/08/2021  Note:   Current Barriers:  [name] Management of chronic medical conditions and medication managemeen  Pharmacist Clinical Goal(s):  [name] Over the next 180 days, patient will achieve adherence to monitoring guidelines and medication adherence to achieve therapeutic efficacy . contact provider office for questions/concerns as evidenced notation of same in electronic health record through collaboration with PharmD and provider.   Interventions: . 1:1 collaboration with [name], NP regarding development and update of comprehensive plan of care as evidenced by provider attestation and co-signature . Inter-disciplinary care team collaboration (see longitudinal plan of care) . Comprehensive medication review performed; medication list updated in electronic medical record  Diabetes: . Controlled; current treatment: prescribed diet and exercise but patient admits that he gets little exercise and does not follow specific diet.   . Current glucose readings: does not check BG at home . 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  . Educated on limiting high CHO foods; recommended increase non starchy vegetables Counseled on increasing exercise like walking around parking lot when he stops at truck stop; He does have an aneurysm to I advised against strenuous exercise.   Hypertension: . Controlled with current treatment: diltiazem 120mg  daily  .       Patient checks BP at home infrequently - patient was not able to provide reading during out phone visit.  Patient has failed these meds in the past: None noted  . Denies/reports hypotensive/hypertensive symptoms - he was having dizziness but attributed this to [place] / levetiracetam . Educated on BP goals Recommended continue current antiHTN therapy ,   Hyperlipidemia: . Controlled; current treatment:pravastatin 80mg  at  bedtime;   . Current dietary patterns: no following any specific dietary restrictions . 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 Recommended try to increase intake of non starchy vegetables Recommended continue current therapy  Chronic Obstructive Pulmonary Disease: . controlled; current treatment: albuterol as rescue inhaler and Flovent 130mcg twice a day (however patient states he prefers Symbicort)  . GOLD Classification: 2 (FEV1 50-79%) . Most recent Pulmonary Function Testing: 05/19/2017 - spirometry FVC 2.84 (73%) FEV1 1.74 (62%) FEV1/FVC 61% Current COPD Classification:  A (low sx, <  2 exacerbations/yr) . Recommended continue current therapy Collaborated with PCP regarding applying for PAP for Symbicort since pt feels it works better than Flovent but will defer tha tdecision to PCP;   Atrial Fibrillation: . Controlled; current rate/rhythm controled with diltiazem 120mg  daily ; anticoagulant treatment: Xarelto 20mg  daily with PM meal . CHADS2VASc score: 5 . Recommended continue current regimen Collaborated with PCP and patient to apply for PAP for Xarelto (patient will have to meet OOP spend and may not qualify yet)   GERD:   Patient is currently controlled on the following medications:   Omeprazole 40mg  daily  Patient has failed these meds in past: None noted   Triggers: laying down after eating  Recommend continue current medications   Seizures:   Patient is currently controlled on the following medications:  Levetiracetam XR 500mg  #2 daily (not taking)   Patient stopped levetiracetam due to cost of $47/month, because he thought it was causing nausea and because he does not feel that he actually had a seizure.   He has f/u with neurology 02/26/21.   I will send message to neuro so they are aware he has not been taking levetiracetam and why.   Patient was advised that he should discuss issue with  provider before stopping medications.    Patient Goals/Self-Care Activities . Over the next 180 days, patient will:  take medications as prescribed and collaborate with provider on medication access solutions  Follow Up Plan: Telephone follow up appointment with care management team member scheduled for:  3 to 4 months               Medication Assistance: Application for Symbicort and Xarelto  medication assistance program. in process.  Anticipated assistance start date 06//11/2020.  See plan of care for additional detail.  Patient's preferred pharmacy is:  [facility] [address], [place] [address] Phone: [phone] Fax: [phone]  [facility] [place] [place] [address] [place] [address] Phone: [phone] Fax: [phone]  [place] [place], [address] [place], [place] [address] [place] [address] Phone: [phone] Fax: [phone]  Follow Up:  Patient agrees to Care Plan and Follow-up.  Plan: Telephone follow up appointment with care management team member scheduled for:  1 month to assess if patient has been approved for PAP   Cherre Robins, PharmD Clinical Pharmacist [facility]

## 2021-02-16 ENCOUNTER — Ambulatory Visit: Payer: Medicare Other | Attending: Internal Medicine

## 2021-02-16 ENCOUNTER — Other Ambulatory Visit (HOSPITAL_BASED_OUTPATIENT_CLINIC_OR_DEPARTMENT_OTHER): Payer: Self-pay

## 2021-02-16 DIAGNOSIS — Z23 Encounter for immunization: Secondary | ICD-10-CM

## 2021-02-16 MED FILL — Albuterol Sulfate Inhal Aero 108 MCG/ACT (90MCG Base Equiv): RESPIRATORY_TRACT | 17 days supply | Qty: 8.5 | Fill #0 | Status: CN

## 2021-02-16 MED FILL — Omeprazole Cap Delayed Release 40 MG: ORAL | 30 days supply | Qty: 30 | Fill #0 | Status: AC

## 2021-02-16 MED FILL — Albuterol Sulfate Inhal Aero 108 MCG/ACT (90MCG Base Equiv): RESPIRATORY_TRACT | 17 days supply | Qty: 18 | Fill #0 | Status: CN

## 2021-02-16 MED FILL — Albuterol Sulfate Inhal Aero 108 MCG/ACT (90MCG Base Equiv): RESPIRATORY_TRACT | 17 days supply | Qty: 8.5 | Fill #0 | Status: AC

## 2021-02-16 NOTE — Progress Notes (Signed)
   Covid-19 Vaccination Clinic  Name:  Robert Bates    MRN: [id number] DOB: 11/07/1940  02/16/2021  [name] was observed post Covid-19 immunization for 15 minutes without incident. He was provided with Vaccine Information Sheet and instruction to access the V-Safe system.   [name] was instructed to call 911 with any severe reactions post vaccine: [name] Difficulty breathing  . Swelling of face and throat  . A fast heartbeat  . A bad rash all over body  . Dizziness and weakness   Immunizations Administered    Name Date Dose VIS Date Route   PFIZER Comrnaty(Gray TOP) Covid-19 Vaccine 02/16/2021  3:23 PM 0.3 mL 10/12/2020 Intramuscular   Manufacturer: [employer], [employer]   Lot: [id number]   NDC: [phone]

## 2021-02-22 ENCOUNTER — Other Ambulatory Visit (HOSPITAL_BASED_OUTPATIENT_CLINIC_OR_DEPARTMENT_OTHER): Payer: Self-pay

## 2021-02-22 MED ORDER — PFIZER-BIONT COVID-19 VAC-TRIS 30 MCG/0.3ML IM SUSP
INTRAMUSCULAR | 0 refills | Status: DC
  Filled 2021-02-22: qty 0.3, 1d supply, fill #0

## 2021-02-26 ENCOUNTER — Ambulatory Visit: Payer: Medicare Other | Admitting: Adult Health

## 2021-03-02 ENCOUNTER — Other Ambulatory Visit (HOSPITAL_BASED_OUTPATIENT_CLINIC_OR_DEPARTMENT_OTHER): Payer: Self-pay

## 2021-03-02 MED FILL — Rivaroxaban Tab 20 MG: ORAL | 30 days supply | Qty: 30 | Fill #0 | Status: AC

## 2021-03-07 ENCOUNTER — Ambulatory Visit (HOSPITAL_BASED_OUTPATIENT_CLINIC_OR_DEPARTMENT_OTHER)
Admission: RE | Admit: 2021-03-07 | Discharge: 2021-03-07 | Disposition: A | Discharge status: Home / Self Care | Payer: Medicare Other | Source: Ambulatory Visit | Attending: Family | Admitting: Family

## 2021-03-07 ENCOUNTER — Other Ambulatory Visit: Payer: Self-pay

## 2021-03-07 ENCOUNTER — Ambulatory Visit (INDEPENDENT_AMBULATORY_CARE_PROVIDER_SITE_OTHER): Payer: Medicare Other | Admitting: Family

## 2021-03-07 VITALS — BP 122/77 | HR 70 | Temp 98.0°F | Resp 16 | Ht 69.0 in | Wt 160.0 lb

## 2021-03-07 DIAGNOSIS — J321 Chronic frontal sinusitis: Secondary | ICD-10-CM | POA: Diagnosis not present

## 2021-03-07 DIAGNOSIS — R519 Headache, unspecified: Secondary | ICD-10-CM | POA: Diagnosis not present

## 2021-03-07 DIAGNOSIS — S0990XA Unspecified injury of head, initial encounter: Secondary | ICD-10-CM | POA: Insufficient documentation

## 2021-03-07 DIAGNOSIS — D329 Benign neoplasm of meninges, unspecified: Secondary | ICD-10-CM | POA: Diagnosis not present

## 2021-03-07 DIAGNOSIS — J3489 Other specified disorders of nose and nasal sinuses: Secondary | ICD-10-CM | POA: Diagnosis not present

## 2021-03-07 DIAGNOSIS — R0789 Other chest pain: Secondary | ICD-10-CM

## 2021-03-07 NOTE — Progress Notes (Signed)
Subjective:   By signing my name below, I, [name], attest that this documentation has been prepared under the direction and in the presence of Sandford Craze, NP. 03/07/2021     Patient ID: Robert Bates, male    DOB: 15-Dec-1940, 80 y.o.   MRN: [id number]  No chief complaint on file.   HPI Patient is in today for a office visit. Since last Sunday, 03/07/2021, he has been having headaches. Last Sunday he hit his head on a beam. 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 He denies having any nausea or vomiting at this time.   Past Medical History:  Diagnosis Date  . Alcoholism (HCC)    past problem per pt but admits to still drinking - unclear how much  . Anxiety   . Arthritis   . 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  [name] COPD (chronic obstructive pulmonary disease) (HCC)   . Diabetes type 2, controlled (HCC) 07/31/2017  . Dysrhythmia    a fib  . GERD (gastroesophageal reflux disease)   . Hematuria    refuses work up or referral - understands risks of morbidity / mortality - 11/2008, 12/2008  . History of hiatal hernia   . History of kidney stones   . Hyperlipemia   . Meningioma (HCC) 10/25/2013   Follows with Dr. Coletta Memos.   . Peripheral vascular disease (HCC)    Abdominal Aortic Aneursym  . Pneumonia    as a child  . Radiation 09/18/15-10/25/15   left lower lobe 70.2 Gy  . Seizures (HCC) 02/18/2020  . Tobacco abuse     Past Surgical History:  Procedure Laterality Date  . CHOLECYSTECTOMY N/A 07/23/2017   Procedure: LAPAROSCOPIC CHOLECYSTECTOMY;  Surgeon: Kinsinger, De Blanch, MD;  [facility]: [facility];  Service: General;  Laterality: N/A;  . COLONOSCOPY    . EYE SURGERY Bilateral    Cataracts removed w/ lens implant  . HERNIA REPAIR     Left 36 years ago . Right inguinal hernia repair 10-01-17 Dr.  Sheliah Hatch  . INGUINAL HERNIA REPAIR Right 10/01/2017   Procedure: RIGHT INGUINAL HERNIA REPAIR WITH MESH;  Surgeon: Kinsinger, De Blanch, MD;  [facility];  Service: General;  Laterality: Right;  TAP BLOCK  . INSERTION OF MESH Right 10/01/2017   Procedure: INSERTION OF MESH;  Surgeon: Kinsinger, De Blanch, MD;  [facility];  Service: General;  Laterality: Right;  . TONSILLECTOMY    . TONSILLECTOMY    . VIDEO BRONCHOSCOPY Bilateral 07/26/2015   Procedure: VIDEO BRONCHOSCOPY WITH FLUORO;  Surgeon: Nyoka Cowden, MD;  [facility];  Service: Cardiopulmonary;  Laterality: Bilateral;  . VIDEO BRONCHOSCOPY WITH ENDOBRONCHIAL NAVIGATION N/A 08/23/2015   Procedure: VIDEO BRONCHOSCOPY WITH ENDOBRONCHIAL NAVIGATION;  Surgeon: Delight Ovens, MD;  Location: [facility];  Service: Thoracic;  Laterality: N/A;  . VIDEO BRONCHOSCOPY WITH ENDOBRONCHIAL ULTRASOUND N/A 08/23/2015   Procedure: VIDEO BRONCHOSCOPY WITH ENDOBRONCHIAL ULTRASOUND;  Surgeon: Delight Ovens, MD;  Location: [facility];  Service: Thoracic;  Laterality: N/A;    Family History  Problem Relation Age of Onset  . Leukemia Father   . Emphysema Father   . Learning disabilities Son   . Leukemia Other   . Stroke Other     Social History   Socioeconomic History  . Marital status: Married  Spouse name: Not on file  . Number of children: 2  . Years of education: Not on file  . Highest education level: Not on file  Occupational History  . Occupation: Retired    Fish farm manager: [employer]    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[place] Use  . Smoking status: Former Smoker    Packs/day: 1.00    Years: 57.00    Pack years: 57.00    Quit date: 08/08/2015    Years since quitting: 5.5  . Smokeless tobacco: Former Systems developer    Types: Chew    Quit date: 11/04/1958  . Tobacco comment: occasional use sneaks around  Vaping Use  . Vaping Use: Every day  Substance and Sexual Activity  . Alcohol use: Yes     Alcohol/week: 0.0 standard drinks    Comment: Pt reports rare use hx of denies 10-01-17  . Drug use: No  . Sexual activity: Yes  Other Topics Concern  . Not on file  Social History Narrative  . Not on file   Social Determinants of Health   Financial Resource Strain: Medium Risk  . Difficulty of Paying Living Expenses: Somewhat hard  Food Insecurity: Not on file  Transportation Needs: Not on file  Physical Activity: Not on file  Stress: Not on file  Social Connections: Not on file  Intimate Partner Violence: Not on file    Outpatient Medications Prior to Visit  Medication Sig Dispense Refill  . albuterol (VENTOLIN HFA) 108 (90 Base) MCG/ACT inhaler Inhale 2 puffs into the lungs every 6 (six) hours as needed for wheezing or shortness of breath. 8.5 g 5  . albuterol (VENTOLIN HFA) 108 (90 Base) MCG/ACT inhaler INHALE 2 PUFFS BY MOUTH INTO THE LUNGS EVERY 6 (SIX) HOURS AS NEEDED FOR WHEEZING OR SHORTNESS OF BREATH. 8.5 g 5  . albuterol (VENTOLIN HFA) 108 (90 Base) MCG/ACT inhaler INHALE 2 PUFFS BY MOUTH INTO THE LUNGS EVERY 6 HOURS AS NEEDED FOR WHEEZING OR SHORTNESS OF BREATH. 8.5 g 5  . amoxicillin (AMOXIL) 500 MG capsule TAKE 1 CAPSULE (500 MG TOTAL) BY MOUTH 3 (THREE) TIMES DAILY. 30 capsule 0  . COVID-19 mRNA Vac-TriS, Pfizer, (PFIZER-BIONT COVID-19 VAC-TRIS) SUSP injection Inject into the muscle. 0.3 mL 0  . COVID-19 mRNA vaccine, Pfizer, 30 MCG/0.3ML injection INJECT AS DIRECTED .3 mL 0  . diltiazem (CARDIZEM CD) 120 MG 24 hr capsule TAKE 1 CAPSULE (120 MG TOTAL) BY MOUTH DAILY. 90 capsule 3  . diltiazem (CARDIZEM CD) 120 MG 24 hr capsule TAKE 1 CAPSULE (120 MG TOTAL) BY MOUTH DAILY. PLEASE KEEP UPCOMING APPT IN NOVEMBER WITH DR. Acie Fredrickson FOR FUTURE REFILLS. THANK YOU 30 capsule 0  . diltiazem (CARTIA XT) 120 MG 24 hr capsule Take 1 capsule (120 mg total) by mouth daily. 90 capsule 3  . fluticasone (FLOVENT HFA) 110 MCG/ACT inhaler Inhale 1 puff into the lungs in the morning and at  bedtime. 1 each 5  . fluticasone (FLOVENT HFA) 110 MCG/ACT inhaler INHALE 1 PUFF BY MOUTH INTO THE LUNGS IN THE MORNING AND AT BEDTIME. 12 g 5  . Fluticasone-Salmeterol (ADVAIR) 250-50 MCG/DOSE AEPB INHALE 1 PUFF BY MOUTH INTO THE LUNGS TWICE DAILY 60 each 3  . levETIRAcetam (KEPPRA XR) 500 MG 24 hr tablet Take 2 tablets (1,000 mg total) by mouth daily. 60 tablet 5  . levETIRAcetam (KEPPRA XR) 500 MG 24 hr tablet TAKE 2 TABLETS (1,000 MG TOTAL) BY MOUTH DAILY. 60 tablet 5  . omeprazole (PRILOSEC)  40 MG capsule TAKE 1 CAPSULE BY MOUTH DAILY **NEED OFFICE VISIT FOR FURTHER REFILLS** 30 capsule 5  . omeprazole (PRILOSEC) 40 MG capsule TAKE 1 CAPSULE BY MOUTH DAILY 30 capsule 5  . pravastatin (PRAVACHOL) 80 MG tablet TAKE 1 TABLET (80 MG TOTAL) BY MOUTH AT BEDTIME. 90 tablet 1  . pravastatin (PRAVACHOL) 80 MG tablet TAKE 1 TABLET (80 MG TOTAL) BY MOUTH AT BEDTIME. 90 tablet 1  . rivaroxaban (XARELTO) 20 MG TABS tablet TAKE 1 TABLET BY MOUTH DAILY WITH SUPPER 30 tablet 5  . tamsulosin (FLOMAX) 0.4 MG CAPS capsule Take 1 capsule (0.4 mg total) by mouth daily. 30 capsule 3  . tamsulosin (FLOMAX) 0.4 MG CAPS capsule TAKE 1 CAPSULE (0.4 MG TOTAL) BY MOUTH DAILY. 30 capsule 3  . XARELTO 20 MG TABS tablet TAKE 1 TABLET (20 MG TOTAL) BY MOUTH DAILY WITH SUPPER. 30 tablet 5   No facility-administered medications prior to visit.    Allergies  Allergen Reactions  . Iodine Other (See Comments)    neck swells  . Iohexol Swelling    Neck and gland swelling per patient.    Review of Systems  Gastrointestinal: Negative for nausea and vomiting.       Objective:    Physical Exam Constitutional:      General: He is not in acute distress.    Appearance: He is well-developed.  HENT:     Head: Normocephalic and atraumatic.  Eyes:     Extraocular Movements: Extraocular movements intact.     Pupils: Pupils are equal, round, and reactive to light.     Comments: No nystagmus  Cardiovascular:     Rate  and Rhythm: Normal rate and regular rhythm.     Pulses: Normal pulses.     Heart sounds: Normal heart sounds. No murmur heard.   Pulmonary:     Effort: Pulmonary effort is normal. No respiratory distress.     Breath sounds: Wheezing (Bilateraly) present. No rhonchi or rales.  Musculoskeletal:     Comments: 5/5 strength on both upper and lower extremities.  Skin:    General: Skin is warm and dry.  Neurological:     Mental Status: He is alert and oriented to person, place, and time.     Cranial Nerves: Cranial nerves are intact.     Comments: bilateral UE/LE strength is 5/5   Psychiatric:        Behavior: Behavior normal.        Thought Content: Thought content normal.     There were no vitals taken for this visit. Wt Readings from Last 3 Encounters:  12/08/20 168 lb 6.4 oz (76.4 kg)  11/06/20 174 lb (78.9 kg)  09/08/20 172 lb 12.8 oz (78.4 kg)    Diabetic Foot Exam - Simple   No data filed    Lab Results  Component Value Date   WBC 8.2 06/20/2020   HGB 14.0 06/20/2020   HCT 42.6 06/20/2020   PLT 229 06/20/2020   GLUCOSE 87 12/08/2020   CHOL 157 03/09/2020   TRIG 154.0 (H) 03/09/2020   HDL 50.10 03/09/2020   LDLDIRECT 142.2 12/26/2008   LDLCALC 76 03/09/2020   ALT 12 12/08/2020   AST 13 12/08/2020   NA 141 12/08/2020   K 4.0 12/08/2020   CL 104 12/08/2020   CREATININE 0.84 12/08/2020   BUN 17 12/08/2020   CO2 28 12/08/2020   TSH 1.31 11/08/2019   PSA 1.61 03/09/2020   INR 1.01 09/30/2017  HGBA1C 6.0 (H) 12/08/2020   MICROALBUR 1.0 06/01/2020    Lab Results  Component Value Date   TSH 1.31 11/08/2019   Lab Results  Component Value Date   WBC 8.2 06/20/2020   HGB 14.0 06/20/2020   HCT 42.6 06/20/2020   MCV 93.0 06/20/2020   PLT 229 06/20/2020   Lab Results  Component Value Date   NA 141 12/08/2020   K 4.0 12/08/2020   CHLORIDE 103 06/17/2017   CO2 28 12/08/2020   GLUCOSE 87 12/08/2020   BUN 17 12/08/2020   CREATININE 0.84 12/08/2020    BILITOT 0.4 12/08/2020   ALKPHOS 71 06/20/2020   AST 13 12/08/2020   ALT 12 12/08/2020   PROT 6.1 12/08/2020   ALBUMIN 4.0 06/20/2020   CALCIUM 9.4 12/08/2020   ANIONGAP 6 06/20/2020   EGFR 86 (L) 06/17/2017   GFR 80.50 11/08/2019   Lab Results  Component Value Date   CHOL 157 03/09/2020   Lab Results  Component Value Date   HDL 50.10 03/09/2020   Lab Results  Component Value Date   LDLCALC 76 03/09/2020   Lab Results  Component Value Date   TRIG 154.0 (H) 03/09/2020   Lab Results  Component Value Date   CHOLHDL 3 03/09/2020   Lab Results  Component Value Date   HGBA1C 6.0 (H) 12/08/2020       Assessment & Plan:   Problem List Items Addressed This Visit   None      No orders of the defined types were placed in this encounter.   I,Shehryar Multimedia programmer as a Education administrator for [employer], NP.,have documented all relevant documentation on the behalf of Nance Pear, NP,as directed by  Nance Pear, NP while in the presence of Nance Pear, NP.   Shehryar Rae Roam NP personally preformed the services described in this documentation.  All medical record entries made by the scribe were at my direction and in my presence.  I have reviewed the chart and discharge instructions (if applicable) and agree that the record reflects my personal performance and is accurate and complete. 03/07/2021

## 2021-03-07 NOTE — Assessment & Plan Note (Signed)
Resolved. EKG tracing is personally reviewed.  EKG notes NSR.  No acute changes.  Will refer back to cardiology and he is instructed to go to the ER if he develops recurrent chest pain.

## 2021-03-07 NOTE — Patient Instructions (Signed)
Please complete CT scan on the first floor.  Go to the ER immediately if you develop recurrent chest pain.  We will work on getting you in with cardiology.

## 2021-03-07 NOTE — Assessment & Plan Note (Signed)
Due to HA and xarelto use, will obtain stat CT head to rule out intracranial bleed.

## 2021-03-11 NOTE — Progress Notes (Signed)
Cardiology Office Note   Date:  03/12/2021   ID:  Robert Bates, DOB May 29, 1941, MRN [id number]  PCP:  Robert Alar, NP  Cardiologist:  Dr. Cathie Olden, MD   Chief Complaint  Patient presents with  . Chest Pain   History of Present Illness: Robert Bates is a 80 y.o. male who presents for chest pain evaluation, seen for Robert Bates.  [name] has PAF, tobacco use with COPD and history of lung CA, peripheral vascular disease, HLD, chronic diastolic CHF and DM2.  He has been anticoagulated with Xarelto 20 mg daily.   Most recently seen by Robert Bates 09/08/2020 in follow-up of PAF and was doing well with no specific issues.   Patient was then seen by his PCP on 03/07/2021 for headache with head trauma and atypical chest pain. I am unable to view much of the notations however it appears an EKG was performed with no acute changes.  He was sent for head CT imaging to rule out intracranial bleed given headache and use of Xarelto. Head CT with no acute abnormality, no change in 2 meningiomas.  He was referred back to cardiology for atypical chest pain.  He has no prior history of CAD. Chest imaging from 06/20/2020 with aortic atherosclerosis of the great vessels including the coronary arteries with a calcified atherosclerotic plaque in the left main, left anterior descending, left circumflex and right coronary arteries.   Last echocardiogram 07/2017 LVEF at 55%, G2 DD.  Today he presents for the evaluation of chest pain. 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 He tried massaging his chest to see if his symptoms would improve however they did not. He states that his symptoms lasted until he got home, approximately 9 hours later. He reports that the chest pain did not return until today when he was walking into this appointment. He describes the pain as a pressure without radiation to his arms, jaw or  back. He has no prior hx of CAD however has risk factors and concerning recent chest CT from last year that shows aortic atherosclerosis of the great vessels including the coronary arteries with a calcified atherosclerotic plaque in the left main, left anterior descending, left circumflex and right coronary arteries. Given this, we discussed proceeding with CCTA for further evaluation. He has no HF symptoms including SOB, LE edema, palpitations, orthopnea, dizziness, or syncope.     Past Medical History:  Diagnosis Date  . Alcoholism ([place])    past problem per pt but admits to still drinking - unclear how much  . Anxiety   . Arthritis   . 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  [name] COPD (chronic obstructive pulmonary disease) ([place])   . Diabetes type 2, controlled ([place]) 07/31/2017  . Dysrhythmia    a fib  . GERD (gastroesophageal reflux disease)   . Hematuria    refuses work up or referral - understands risks of morbidity / mortality - 11/2008, 12/2008  . History of hiatal hernia   . History of kidney stones   . Hyperlipemia   . Meningioma ([place]) 10/25/2013   Follows with Dr. [name].   . Peripheral vascular disease ([place])    Abdominal Aortic Aneursym  . Pneumonia    as a child  . Radiation 09/18/15-10/25/15  left lower lobe 70.2 Gy  . Seizures ([place]) 02/18/2020  . Tobacco abuse     Past Surgical History:  Procedure Laterality Date  . CHOLECYSTECTOMY N/A 07/23/2017   Procedure: LAPAROSCOPIC CHOLECYSTECTOMY;  Surgeon: Kinsinger, Arta Bruce, MD;  [facility];  Service: General;  Laterality: N/A;  . COLONOSCOPY    . EYE SURGERY Bilateral    Cataracts removed w/ lens implant  . HERNIA REPAIR     Left 36 years ago . Right inguinal hernia repair 10-01-17 Dr. Kieth Brightly  . INGUINAL HERNIA REPAIR Right 10/01/2017   Procedure: RIGHT INGUINAL HERNIA REPAIR WITH MESH;  Surgeon: Kinsinger, Arta Bruce,  MD;  [facility];  Service: General;  Laterality: Right;  TAP BLOCK  . INSERTION OF MESH Right 10/01/2017   Procedure: INSERTION OF MESH;  Surgeon: Kinsinger, Arta Bruce, MD;  [facility];  Service: General;  Laterality: Right;  . TONSILLECTOMY    . TONSILLECTOMY    . VIDEO BRONCHOSCOPY Bilateral 07/26/2015   Procedure: VIDEO BRONCHOSCOPY WITH FLUORO;  Surgeon: [name], MD;  [facility];  Service: Cardiopulmonary;  Laterality: Bilateral;  . VIDEO BRONCHOSCOPY WITH ENDOBRONCHIAL NAVIGATION N/A 08/23/2015   Procedure: VIDEO BRONCHOSCOPY WITH ENDOBRONCHIAL NAVIGATION;  Surgeon: Grace Isaac, MD;  Location: [facility];  Service: Thoracic;  Laterality: N/A;  . VIDEO BRONCHOSCOPY WITH ENDOBRONCHIAL ULTRASOUND N/A 08/23/2015   Procedure: VIDEO BRONCHOSCOPY WITH ENDOBRONCHIAL ULTRASOUND;  Surgeon: Grace Isaac, MD;  Location: [facility];  Service: Thoracic;  Laterality: N/A;     Current Outpatient Medications  Medication Sig Dispense Refill  . albuterol (VENTOLIN HFA) 108 (90 Base) MCG/ACT inhaler Inhale 2 puffs into the lungs every 6 (six) hours as needed for wheezing or shortness of breath. 8.5 g 5  . diltiazem (CARDIZEM) 120 MG tablet Take 120 mg by mouth 4 (four) times daily.    . diphenhydrAMINE (BANOPHEN) 25 MG tablet Take 1 tablet (25 mg total) by mouth once 1 hour before testing 100 tablet 0  . fluticasone (FLOVENT HFA) 110 MCG/ACT inhaler Inhale 1 puff into the lungs in the morning and at bedtime. 1 each 5  . Fluticasone-Salmeterol (ADVAIR) 250-50 MCG/DOSE AEPB INHALE 1 PUFF BY MOUTH INTO THE LUNGS TWICE DAILY 60 each 3  . levETIRAcetam (KEPPRA XR) 500 MG 24 hr tablet Take 2 tablets (1,000 mg total) by mouth daily. 60 tablet 5  . metoprolol tartrate (LOPRESSOR) 50 MG tablet Take 1 tablet (50 mg total) by mouth once 90-120 minutes before testing 1 tablet 0  . nitroGLYCERIN (NITROSTAT) 0.4 MG SL tablet Place 1 tablet (0.4 mg total) under the tongue every 5 (five)  minutes as needed for chest pain. 75 tablet 0  . omeprazole (PRILOSEC) 40 MG capsule TAKE 1 CAPSULE BY MOUTH DAILY **NEED OFFICE VISIT FOR FURTHER REFILLS** 30 capsule 5  . pravastatin (PRAVACHOL) 80 MG tablet TAKE 1 TABLET (80 MG TOTAL) BY MOUTH AT BEDTIME. 90 tablet 1  . predniSONE (DELTASONE) 50 MG tablet Take 1 tablet by mouth 13 hours prior to test, then 1 tablet 7 hours prior to test, then 1 tablet 1 hour prior to test. 3 tablet 0  . rivaroxaban (XARELTO) 20 MG TABS tablet TAKE 1 TABLET BY MOUTH DAILY WITH SUPPER 30 tablet 5  . tamsulosin (FLOMAX) 0.4 MG CAPS capsule Take 1 capsule (0.4 mg total) by mouth daily. 30 capsule 3   No current facility-administered medications for this visit.    Allergies:   Iodine and Iohexol  Social History:  The patient  reports that he quit smoking about 5 years ago. He has a 57.00 pack-year smoking history. He quit smokeless tobacco use about 62 years ago.  His smokeless tobacco use included chew. He reports current alcohol use. He reports that he does not use drugs.   Family History:  The patient's family history includes Emphysema in his father; Learning disabilities in his son; Leukemia in his father and another family member; Stroke in an other family member.    ROS:  Please see the history of present illness. Otherwise, review of systems are positive for none.   All other systems are reviewed and negative.    PHYSICAL EXAM: VS:  BP 122/68   Pulse 72   Ht 5\' 9"  (1.753 m)   Wt 165 lb (74.8 kg)   SpO2 97%   BMI 24.37 kg/m  , BMI Body mass index is 24.37 kg/m.   General: Well developed, well nourished, NAD Neck: Negative for carotid bruits. No JVD Lungs:Clear to ausculation bilaterally. Breathing is unlabored. Cardiovascular: RRR with S1 S2. No murmurs Extremities: No edema. Radial pulses 2+ bilaterally Neuro: Alert and oriented. No focal deficits. No facial asymmetry. MAE spontaneously. Psych: Responds to questions appropriately with  normal affect.     EKG:  EKG is ordered today. The ekg ordered today demonstrates NSR with HR 67bpm and no acute changes when compared to prior tracings    Recent Labs: 06/20/2020: Hemoglobin 14.0; Platelet Count 229 12/08/2020: ALT 12; BUN 17; Creat 0.84; Potassium 4.0; Sodium 141    Lipid Panel    Component Value Date/Time   CHOL 157 03/09/2020 1429   TRIG 154.0 (H) 03/09/2020 1429   HDL 50.10 03/09/2020 1429   CHOLHDL 3 03/09/2020 1429   VLDL 30.8 03/09/2020 1429   LDLCALC 76 03/09/2020 1429   LDLDIRECT 142.2 12/26/2008 1032    Wt Readings from Last 3 Encounters:  03/12/21 165 lb (74.8 kg)  03/07/21 160 lb (72.6 kg)  12/08/20 168 lb 6.4 oz (76.4 kg)    Other studies Reviewed:  Echocardiogram 07/22/2017:  - Left ventricle: The cavity size was normal. Systolic function was  normal. The estimated ejection fraction was in the range of 55%  to 60%. Wall motion was normal; there were no regional wall  motion abnormalities. Features are consistent with a pseudonormal  left ventricular filling pattern, with concomitant abnormal  relaxation and increased filling pressure (grade 2 diastolic  dysfunction).  - Aortic valve: Valve area (VTI): 2.93 cm^2. Valve area (Vmax):  2.78 cm^2. Valve area (Vmean): 2.58 cm^2.  - Right atrium: The atrium was mildly dilated.  - Pulmonary arteries: Systolic pressure was mildly increased. PA  peak pressure: 38 mm Hg (S).   Chest CT 06/20/2020:  FINDINGS: Cardiovascular: Heart size is normal. Small amount of pericardial fluid and/or thickening anteriorly, similar to the prior study, and unlikely to be of hemodynamic significance at this time. No associated pericardial calcification. There is aortic atherosclerosis, as well as atherosclerosis of the great vessels of the mediastinum and the coronary arteries, including calcified atherosclerotic plaque in the left main, left anterior descending, left circumflex and right coronary  arteries. 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  ASSESSMENT AND PLAN:  1.  Atypical chest pain: -Has extensive coronary calcification on chest imaging from 06/20/2020 with calcifications in the left main, LAD, LCx and RCA. Given recent onset of chest pain, plan is to proceed with CCTA for further evaluation. If significant lesions on  imaging, plan to proceed with LHC.  -Continue statin -Will add beta blocker for scan -No ASA in the setting of Xarelto  -Has a contrast dye allergy therefore will pre-medicate with Prednisone Q13, Q7 and Q1H before scan. Will also prescribe benadryl 25mg  1H prior to procedure   -Creatinine, 0.84 on 12/08/2020 -Add SL NTG to regimen>>administration instructions given   2.  PAF: -Maintaining NSR with diltiazem 120 mg daily -Anticoagulated with Xarelto 20 mg daily  3.  COPD with lung CA: -Continues to smoke intermittently  -Follow with oncology  -On multiple inhalers  4.  Ectasia of ascending thoracic aorta: -Measuring 4.0 cm in diameter -Will plan to follow closely  -Maintain adequate BP  5.  HLD: -LDL, 76 on 03/09/2020 -Continue pravastatin  6. Tobacco Use: -Continues to intermittently smoke  -Cessation encouraged    Current medicines are reviewed at length with the patient today.  The patient does not have concerns regarding medicines.  The following changes have been made:  Add SL NTG,    Labs/ tests ordered today include: BMET   Orders Placed This Encounter  Procedures  . CT CORONARY MORPH W/CTA COR W/SCORE W/CA W/CM &/OR WO/CM  . CT CORONARY FRACTIONAL FLOW RESERVE DATA PREP  . CT CORONARY FRACTIONAL FLOW RESERVE FLUID ANALYSIS  . Basic metabolic panel    Disposition:   FU with APP in 1 month  Signed, Kathyrn Drown, NP  03/12/2021 4:31 PM    [facility] [place], [place], [place]  [address] Phone: [phone]; Fax: [phone]

## 2021-03-12 ENCOUNTER — Ambulatory Visit (INDEPENDENT_AMBULATORY_CARE_PROVIDER_SITE_OTHER): Payer: Medicare Other | Admitting: Cardiology

## 2021-03-12 ENCOUNTER — Other Ambulatory Visit: Payer: Self-pay

## 2021-03-12 ENCOUNTER — Other Ambulatory Visit (HOSPITAL_BASED_OUTPATIENT_CLINIC_OR_DEPARTMENT_OTHER): Payer: Self-pay

## 2021-03-12 ENCOUNTER — Encounter: Payer: Self-pay | Admitting: Cardiology

## 2021-03-12 VITALS — BP 122/68 | HR 72 | Ht 69.0 in | Wt 165.0 lb

## 2021-03-12 DIAGNOSIS — I5032 Chronic diastolic (congestive) heart failure: Secondary | ICD-10-CM

## 2021-03-12 DIAGNOSIS — I712 Thoracic aortic aneurysm, without rupture, unspecified: Secondary | ICD-10-CM

## 2021-03-12 DIAGNOSIS — R079 Chest pain, unspecified: Secondary | ICD-10-CM

## 2021-03-12 DIAGNOSIS — Z720 Tobacco use: Secondary | ICD-10-CM

## 2021-03-12 DIAGNOSIS — I480 Paroxysmal atrial fibrillation: Secondary | ICD-10-CM | POA: Diagnosis not present

## 2021-03-12 DIAGNOSIS — I10 Essential (primary) hypertension: Secondary | ICD-10-CM

## 2021-03-12 MED ORDER — METOPROLOL TARTRATE 50 MG PO TABS
50.0000 mg | ORAL_TABLET | Freq: Once | ORAL | 0 refills | Status: DC
  Filled 2021-03-12: qty 1, 1d supply, fill #0

## 2021-03-12 MED ORDER — PREDNISONE 50 MG PO TABS
ORAL_TABLET | ORAL | 0 refills | Status: DC
  Filled 2021-03-12: qty 3, 1d supply, fill #0

## 2021-03-12 MED ORDER — DIPHENHYDRAMINE HCL 25 MG PO TABS
25.0000 mg | ORAL_TABLET | Freq: Once | ORAL | 0 refills | Status: DC
  Filled 2021-03-12: qty 100, 100d supply, fill #0

## 2021-03-12 MED ORDER — NITROGLYCERIN 0.4 MG SL SUBL
0.4000 mg | SUBLINGUAL_TABLET | SUBLINGUAL | 0 refills | Status: DC | PRN
  Filled 2021-03-12: qty 75, 1d supply, fill #0

## 2021-03-12 NOTE — Patient Instructions (Addendum)
Medication Instructions:  Your physician has recommended you make the following change in your medication:  1. START NITROGLYCERIN 0.4 MG SUBUNGUIAL AS NEEDED FOR CHEST PAIN.     *If you need a refill on your cardiac medications before your next appointment, please call your pharmacy*   Lab Work: TODAY: BMET If you have labs (blood work) drawn today and your tests are completely normal, you will receive your results only by: [name] MyChart Message (if you have MyChart) OR . A paper copy in the mail If you have any lab test that is abnormal or we need to change your treatment, we will call you to review the results.   Testing/Procedures: YOU PHYSICIAN RECOMMENDS YOU TO HAVE A CORONARY CT-A DONE AT [facility].    Follow-Up: At [facility], you and your health needs are our priority.  As part of our continuing mission to provide you with exceptional heart care, we have created designated Provider Care Teams.  These Care Teams include your primary Cardiologist (physician) and Advanced Practice Providers (APPs -  Physician Assistants and Nurse Practitioners) who all work together to provide you with the care you need, when you need it.  We recommend signing up for the patient portal called "MyChart".  Sign up information is provided on this After Visit Summary.  MyChart is used to connect with patients for Virtual Visits (Telemedicine).  Patients are able to view lab/test results, encounter notes, upcoming appointments, etc.  Non-urgent messages can be sent to your provider as well.   To learn more about what you can do with MyChart, go to [email or web address].    Your next appointment:   FOLLOW UP AFTER TEST IS COMPLETE  The format for your next appointment:   In Person  Provider:   You may see Mertie Moores, MD or one of the following Advanced Practice Providers on your designated Care Team:    Richardson Dopp, PA-C  [place], [place]    Other Instructions  Your cardiac  CT will be scheduled at one of the below locations:   [facility] [address] [place], [place] [address] [phone]   If scheduled at [facility], please arrive at the [facility] main entrance (entrance A) of [facility] 30 minutes prior to test start time. Proceed to the [facility] Radiology Department (first floor) to check-in and test prep.  Please follow these instructions carefully (unless otherwise directed):  Hold all erectile dysfunction medications at least 3 days (72 hrs) prior to test.  On the Night Before the Test: . Be sure to Drink plenty of water. . Do not consume any caffeinated/decaffeinated beverages or chocolate 12 hours prior to your test. . Do not take any antihistamines 12 hours prior to your test. . If the patient has contrast allergy: ? Patient will need a prescription for Prednisone and very clear instructions (as follows): 1. Prednisone 50 mg - take 13 hours prior to test 2. Take another Prednisone 50 mg 7 hours prior to test 3. Take another Prednisone 50 mg 1 hour prior to test 4. Take Benadryl 25 mg 1 hour prior to test . Patient must complete all four doses of above prophylactic medications. . Patient will need a ride after test due to Benadryl.  On the Day of the Test: . Drink plenty of water until 1 hour prior to the test. . Do not eat any food 4 hours prior to the test. . You may take your regular  medications prior to the test.  . Take metoprolol (Lopressor) two hours prior to test. . HOLD Furosemide/Hydrochlorothiazide morning of the test.  After the Test: . Drink plenty of water. . After receiving IV contrast, you may experience a mild flushed feeling. This is normal. . On occasion, you may experience a mild rash up to 24 hours after the test. This is not dangerous. If this occurs, you can take Benadryl 25 mg and increase your fluid intake. . If you experience trouble breathing, this can be serious. If it is  severe call 911 IMMEDIATELY. If it is mild, please call our office. . If you take any of these medications: Glipizide/Metformin, Avandament, Glucavance, please do not take 48 hours after completing test unless otherwise instructed.   Once we have confirmed authorization from your insurance company, we will call you to set up a date and time for your test. Based on how quickly your insurance processes prior authorizations requests, please allow up to 4 weeks to be contacted for scheduling your Cardiac CT appointment. Be advised that routine Cardiac CT appointments could be scheduled as many as 8 weeks after your provider has ordered it.  For non-scheduling related questions, please contact the cardiac imaging nurse navigator should you have any questions/concerns: Marchia Bond, Cardiac Imaging Nurse Navigator Gordy Clement, Cardiac Imaging Nurse Navigator [facility] Direct Office Dial: [phone]   For scheduling needs, including cancellations and rescheduling, please call [place], [phone].

## 2021-03-13 ENCOUNTER — Other Ambulatory Visit: Payer: Self-pay | Admitting: Family

## 2021-03-13 ENCOUNTER — Other Ambulatory Visit: Payer: Self-pay

## 2021-03-13 ENCOUNTER — Other Ambulatory Visit (HOSPITAL_BASED_OUTPATIENT_CLINIC_OR_DEPARTMENT_OTHER): Payer: Self-pay

## 2021-03-13 LAB — BASIC METABOLIC PANEL
BUN/Creatinine Ratio: 24 (ref 10–24)
BUN: 20 mg/dL (ref 8–27)
CO2: 27 mmol/L (ref 20–29)
Calcium: 9.2 mg/dL (ref 8.6–10.2)
Chloride: 102 mmol/L (ref 96–106)
Creatinine, Ser: 0.82 mg/dL (ref 0.76–1.27)
Glucose: 78 mg/dL (ref 65–99)
Potassium: 4.3 mmol/L (ref 3.5–5.2)
Sodium: 142 mmol/L (ref 134–144)
eGFR: 89 mL/min/{1.73_m2} (ref >59)

## 2021-03-13 MED ORDER — OMEPRAZOLE 40 MG PO CPDR
40.0000 mg | DELAYED_RELEASE_CAPSULE | Freq: Every day | ORAL | 5 refills | Status: DC
  Filled 2021-03-13: qty 30, 30d supply, fill #0
  Filled 2021-05-03: qty 30, 30d supply, fill #1
  Filled 2021-06-12: qty 30, 30d supply, fill #2
  Filled 2021-07-05: qty 30, 30d supply, fill #3
  Filled 2021-08-07: qty 30, 30d supply, fill #4
  Filled 2021-09-14: qty 30, 30d supply, fill #5

## 2021-03-13 MED ORDER — TAMSULOSIN HCL 0.4 MG PO CAPS
ORAL_CAPSULE | Freq: Every day | ORAL | 3 refills | Status: DC
  Filled 2021-03-13: qty 30, 30d supply, fill #0
  Filled 2021-06-04: qty 30, 30d supply, fill #1
  Filled 2021-06-27: qty 30, 30d supply, fill #2
  Filled 2021-08-07: qty 30, 30d supply, fill #3

## 2021-03-14 ENCOUNTER — Encounter: Payer: Self-pay | Admitting: Family

## 2021-03-14 ENCOUNTER — Other Ambulatory Visit (HOSPITAL_BASED_OUTPATIENT_CLINIC_OR_DEPARTMENT_OTHER): Payer: Self-pay

## 2021-03-14 ENCOUNTER — Telehealth: Payer: Self-pay | Admitting: Family

## 2021-03-14 NOTE — Telephone Encounter (Signed)
Patient reports has a history of drinking socially but has not been drinking at all since he was started on blood thinners.  History was reenter as not currently drinking alcohol.

## 2021-03-14 NOTE — Telephone Encounter (Signed)
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 Patient's states that is not truth he has not drink in 15 years.

## 2021-03-14 NOTE — Addendum Note (Signed)
Addended by: Jacinta Shoe on: 03/14/2021 07:52 AM   Modules accepted: Orders

## 2021-03-16 ENCOUNTER — Other Ambulatory Visit (HOSPITAL_BASED_OUTPATIENT_CLINIC_OR_DEPARTMENT_OTHER): Payer: Self-pay

## 2021-03-19 ENCOUNTER — Ambulatory Visit (INDEPENDENT_AMBULATORY_CARE_PROVIDER_SITE_OTHER): Payer: Medicare Other | Admitting: Pharmacist

## 2021-03-19 DIAGNOSIS — E119 Type 2 diabetes mellitus without complications: Secondary | ICD-10-CM

## 2021-03-19 DIAGNOSIS — I480 Paroxysmal atrial fibrillation: Secondary | ICD-10-CM | POA: Diagnosis not present

## 2021-03-19 DIAGNOSIS — J449 Chronic obstructive pulmonary disease, unspecified: Secondary | ICD-10-CM | POA: Diagnosis not present

## 2021-03-19 DIAGNOSIS — I700 Atherosclerosis of aorta: Secondary | ICD-10-CM

## 2021-03-19 DIAGNOSIS — R569 Unspecified convulsions: Secondary | ICD-10-CM

## 2021-03-19 DIAGNOSIS — E785 Hyperlipidemia, unspecified: Secondary | ICD-10-CM

## 2021-03-19 NOTE — Chronic Care Management (AMB) (Signed)
Chronic Care Management Pharmacy Note  03/19/2021 Name:  Robert Bates MRN:  [id number] DOB:  09/19/1941  Subjective: Robert Bates is an 80 y.o. year old male who is a primary patient of [name], NP.  The CCM team was consulted for assistance with disease management and care coordination needs.    Engaged with patient by telephone for follow up visit in response to provider referral for pharmacy case management and/or care coordination services.   Consent to Services:  The patient was given information about Chronic Care Management services, agreed to services, and gave verbal consent prior to initiation of services.  Please see initial visit note for detailed documentation.   Patient Care Team: [name], NP as PCP - General Nahser, Wonda Cheng, MD as PCP - Cardiology (Cardiology) Elsie Stain, MD as Attending Physician (Pulmonary Disease) Virgina Evener, OD (Optometry) Cherre Robins, PharmD (Pharmacist)  Recent office visits: 03/07/2021 - PCP Inda Castle) Acute visit for HA; HA started after hitting head on a beam on 03/04/2021; patient also reported chest pain on 5/3. CT head ordered due to taking Xarelto; CT showed no acute abnormalities and no change in 2 meningiomas previously present. Ordered EKG for CP - no acute changed noted / NSR;   Recent consult visits: 03/12/2021 - Cardio Glenford Peers, NP) Atypical CP. Ordered CCTA - added beta blocker for scan and also will premedicate with prednisone Q13, 7 and 1 hour before scan 2/2 h/o contract dye allergy; NTG added.   Hospital visits: None in previous 6 months  Objective:  Lab Results  Component Value Date   CREATININE 0.82 03/12/2021   CREATININE 0.84 12/08/2020   CREATININE 1.01 06/20/2020    Lab Results  Component Value Date   HGBA1C 6.0 (H) 12/08/2020   Last diabetic Eye exam: No results found for: HMDIABEYEEXA  Last diabetic Foot exam: No results found for: HMDIABFOOTEX      Component Value  Date/Time   CHOL 157 03/09/2020 1429   TRIG 154.0 (H) 03/09/2020 1429   HDL 50.10 03/09/2020 1429   CHOLHDL 3 03/09/2020 1429   VLDL 30.8 03/09/2020 1429   LDLCALC 76 03/09/2020 1429   LDLDIRECT 142.2 12/26/2008 1032    Hepatic Function Latest Ref Rng & Units 12/08/2020 06/20/2020 11/08/2019  Total Protein 6.1 - 8.1 g/dL 6.1 5.9(L) 6.2  Albumin 3.5 - 5.0 g/dL - 4.0 3.9  AST 10 - 35 U/L 13 10(L) 13  ALT 9 - 46 U/L _0 Alk Phosphatase 38 - 126 U/L - 71 87  Total Bilirubin 0.2 - 1.2 mg/dL 0.4 0.4 0.5  Bilirubin, Direct 0.0 - 0.3 mg/dL - - -    Lab Results  Component Value Date/Time   TSH 1.31 11/08/2019 02:55 PM   TSH 1.53 04/27/2018 03:09 PM    CBC Latest Ref Rng & Units 06/20/2020 04/02/2020 11/08/2019  WBC 4.0 - 10.5 K/uL 8.2 6.9 8.3  Hemoglobin 13.0 - 17.0 g/dL 14.0 14.2 15.0  Hematocrit 39.0 - 52.0 % 42.6 43.1 45.5  Platelets 150 - 400 K/uL 229 216 258.0    No results found for: VD25OH  Clinical ASCVD: Yes  The 10-year ASCVD risk score Mikey Bussing DC Jr., et al., 2013) is: 51.8%   Values used to calculate the score:     Age: 68 years     Sex: Male     Is Non-Hispanic African American: No     Diabetic: Yes     Tobacco smoker: Yes     Systolic  Blood Pressure: 122 mmHg     Is BP treated: Yes     HDL Cholesterol: 50.1 mg/dL     Total Cholesterol: 157 mg/dL    Other: (CHADS2VASc if Afib, PHQ9 if depression, MMRC or CAT for COPD, ACT, DEXA)  Social History   Tobacco Use  Smoking Status Former Smoker  . Packs/day: 1.00  . Years: 57.00  . Pack years: 57.00  . Quit date: 08/08/2015  . Years since quitting: 5.6  Smokeless Tobacco Former Systems developer  . Types: Chew  . Quit date: 11/04/1958  Tobacco Comment   occasional use sneaks around   BP Readings from Last 3 Encounters:  03/12/21 122/68  03/07/21 122/77  12/08/20 129/68   Pulse Readings from Last 3 Encounters:  03/12/21 72  03/07/21 70  12/08/20 66   Wt Readings from Last 3 Encounters:  03/12/21 165 lb (74.8 kg)   03/07/21 160 lb (72.6 kg)  12/08/20 168 lb 6.4 oz (76.4 kg)    Assessment: Review of patient past medical history, allergies, medications, health status, including review of consultants reports, laboratory and other test data, was performed as part of comprehensive evaluation and provision of chronic care management services.   SDOH:  (Social Determinants of Health) assessments and interventions performed:  SDOH Interventions   Flowsheet Row Most Recent Value  SDOH Interventions   Physical Activity Interventions Other (Comments)  [Has somewhat physical job - delivery driver]      CCM Care Plan  Allergies  Allergen Reactions  . Iodine Other (See Comments)    neck swells  . Iohexol Swelling    Neck and gland swelling per patient.    Medications Reviewed Today    Reviewed by Cherre Robins, PharmD (Pharmacist) on 03/19/21 at 1338  Med List Status: <None>  Medication Order Taking? Sig Documenting Provider Last Dose Status Informant  albuterol (VENTOLIN HFA) 108 (90 Base) MCG/ACT inhaler [id number] Yes Inhale 2 puffs into the lungs every 6 (six) hours as needed for wheezing or shortness of breath. [name], NP Taking Active   diltiazem (CARDIZEM CD) 120 MG 24 hr capsule [id number] Yes Take 120 mg by mouth daily. [provider] Taking Active   diphenhydrAMINE (BANOPHEN) 25 MG tablet [id number]  Take 1 tablet (25 mg total) by mouth once 1 hour before testing Kathyrn Drown D, NP  Expired 03/14/21 2359   fluticasone (FLOVENT HFA) 110 MCG/ACT inhaler [id number] Yes Inhale 1 puff into the lungs in the morning and at bedtime. [name], NP Taking Active   Fluticasone-Salmeterol (ADVAIR) 250-50 MCG/DOSE AEPB [id number] No INHALE 1 PUFF BY MOUTH INTO THE LUNGS TWICE DAILY  Patient not taking: Reported on 03/19/2021   [name], NP Not Taking Consider Medication Status and Discontinue   levETIRAcetam (KEPPRA XR) 500 MG 24 hr tablet [id number] No Take 2 tablets  (1,000 mg total) by mouth daily.  Patient not taking: Reported on 03/19/2021   Ward Givens, NP Not Taking Active            Med Note Madaline Brilliant Mar 12, 2021  3:22 PM)    metoprolol tartrate (LOPRESSOR) 50 MG tablet [id number]  Take 1 tablet (50 mg total) by mouth once 90-120 minutes before testing Kathyrn Drown D, NP  Expired 03/14/21 2359   nitroGLYCERIN (NITROSTAT) 0.4 MG SL tablet [id number] Yes Place 1 tablet (0.4 mg total) under the tongue every 5 (five) minutes as needed for chest pain. Tommie Raymond, NP Taking Active   omeprazole (  PRILOSEC) 40 MG capsule [id number] Yes Take 1 capsule (40 mg total) by mouth daily. [name], NP Taking Active   pravastatin (PRAVACHOL) 80 MG tablet [id number] Yes TAKE 1 TABLET (80 MG TOTAL) BY MOUTH AT BEDTIME. [name], NP Taking Active   predniSONE (DELTASONE) 50 MG tablet [id number] No Take 1 tablet by mouth 13 hours prior to test, then 1 tablet 7 hours prior to test, then 1 tablet 1 hour prior to test.  Patient not taking: Reported on 03/19/2021   Tommie Raymond, NP Not Taking Active   rivaroxaban (XARELTO) 20 MG TABS tablet [id number] Yes TAKE 1 TABLET BY MOUTH DAILY WITH SUPPER Bhagat, Bhavinkumar, PA Taking Active   tamsulosin (FLOMAX) 0.4 MG CAPS capsule [id number] Yes TAKE 1 CAPSULE (0.4 MG TOTAL) BY MOUTH DAILY. [name], NP Taking Active           Patient Active Problem List   Diagnosis Date Noted  . Head trauma 03/07/2021  . Left leg pain 04/07/2020  . Benign prostatic hyperplasia with nocturia 03/08/2020  . Seizures ([place]) 02/18/2020  . PAF (paroxysmal atrial fibrillation) ([place]) 08/15/2017  . Diabetes type 2, controlled ([place]) 07/31/2017  . Cholecystitis 07/21/2017  . COPD GOLD II  04/03/2017  . 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 Lung cancer ([place]) 11/07/2014  . 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 HTN (hypertension) 04/29/2014  . Meningioma ([place])  10/25/2013  . Low back pain 10/25/2013  . 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 Atypical chest pain 08/14/2011  . KERATOSIS 10/09/2010  . SCIATICA, RIGHT 10/09/2010  . UNSPECIFIED HEARING LOSS 05/21/2010  . Hyperlipidemia 05/14/2010  . 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[place]) 01/29/2010  . LIPOMA 11/17/2009  . MICROSCOPIC HEMATURIA 08/11/2008  . GERD 07/26/2008  . DERMOID CYST 07/25/2008    Immunization History  Administered Date(s) Administered  . Fluad Quad(high Dose 65+) 07/23/2019, 09/04/2020  . Influenza Split 08/02/2011, 08/18/2012  . Influenza Whole 08/17/2008  . Influenza, High Dose Seasonal PF 08/09/2015, 09/30/2016, 07/30/2017, 08/07/2018  . Influenza,inj,Quad PF,6+ Mos 07/27/2013, 08/09/2014  . PFIZER Comirnaty(Gray Top)Covid-19 Tri-Sucrose Vaccine 02/16/2021  . PFIZER(Purple Top)SARS-COV-2 Vaccination 01/28/2020, 02/21/2020, 09/04/2020  . Pneumococcal Conjugate-13 08/09/2014  . Pneumococcal Polysaccharide-23 05/14/2010  . Td 07/25/2008    Conditions to be addressed/monitored: Atrial Fibrillation, HTN, HLD, COPD and BPH; type 2 DM; GERD; seizure, aortic aneurysm.  Care Plan : General Pharmacy (Adult)  Updates made by Cherre Robins, PHARMD since 03/19/2021 12:00 AM    Problem: Management of chronic medicaiton conditions and medication: Atrial Fibrillation, HTN, HLD, type 2 DM, Pulmonary Disease and BPH; seizures; h/o lung cancer and meningioma; GERD; aortic aneurysm   Priority: High  Onset Date: 02/08/2021  Note:   Current Barriers:  [name] Management of chronic medical conditions and medication management . Unable to afford medication therapy  Pharmacist Clinical Goal(s):  [name] Over the next 180 days, patient will achieve adherence to monitoring guidelines and medication adherence to achieve therapeutic efficacy . contact provider office for questions/concerns as evidenced notation of same in electronic health record  through collaboration with PharmD and provider.   Interventions: . 1:1 collaboration with [name], NP regarding development and update of comprehensive plan of care as evidenced by provider attestation and co-signature . Inter-disciplinary care team collaboration (see longitudinal plan of care) . Comprehensive medication review performed; medication list updated in electronic medical record  Diabetes: . Controlled; A1c goal < 7.0% . Current  treatment:  o prescribed diet and exercise but patient admits that he gets little exercise and does not follow specific diet.   . Current glucose readings: does not check BG at home . 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  . 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   Hypertension (BP goal <130/80) . Controlled with current treatment: diltiazem XR 152m daily  . Patient checks BP at home infrequently - patient was not able to provide reading during today's phone visit because he was away from home. o Patient has failed these meds in the past: None noted  . Denies/reports hypotensive/hypertensive symptoms - he was having dizziness but attributed this to Keppra / levetiracetam . Interventions:  o Educated on BP goals o Recommended continue current antiHTN therapy  Hyperlipidemia (LDL < 70) . Controlled;  . current treatment: o pravastatin 89mat bedtime;   . Current dietary patterns: no following any specific dietary restrictions . 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 Recommended try to increase intake of non starchy vegetables o Recommended continue current therapy  Chronic Obstructive Pulmonary  Disease: . Controlled . Current treatment:  o albuterol as rescue inhaler o Flovent 11044mtwice a day (however patient states he prefers Symbicort)  . GOLD Classification: 2 (FEV1 50-79%) . Most recent Pulmonary Function Testing: 05/19/2017 - spirometry o FVC 2.84 (73%) o FEV1 1.74 (62%) o FEV1/FVC 61% . Current COPD Classification:  A (low sx, <2 exacerbations/yr) . Mailed patient PAP application after last visit in April but he states he has not received yet. Will resend.  . Interventions:  o Collaborated with PCP who approved getting Symbicort 160/4.5mc17mnhaler - 2 puffs twice a day for patient since he prefers and PAP program is easier application. Filled out PAP application and mailed to patient to sign and then return to office for PCP signature.   Atrial Fibrillation: . Controlled . Current rate/rhythm controled with diltiazem 120mg52mly . Anticoagulant treatment: Xarelto 20mg 71my with PM meal . Mailed patient PAP application after last visti in April but he states he has not received yet. Will resend.  . CHADMarland Kitchen2VASc score: 5 . Interventions:  o Recommended continue current regimen o Collaborated with PCP. Filled out PAP application and mailed to patient to sign and then return to office for PCP signature. (patient will have to meet OOP spend and may not qualify yet)   GERD:   Patient is currently controlled on the following medications:   Omeprazole 40mg d81m  Patient has failed these meds in past: None noted   Triggers: laying down after eating  Recommend continue current medications   Seizures:   Patient is currently controlled on the following medications:  Levetiracetam XR 500mg #237mly (not taking)  Patient stopped levetiracetam due to cost of $47/month, because he thought it was causing nausea and because he does not feel that he actually had a seizure.   He has f/u with neurology 02/26/21.   Note was sent to neurologist last week regarding patient not  taking levetiracetam and she plans to reach out to patient.   Patient was advised that he should discuss issues with provider before stopping medications.  Patient Goals/Self-Care Activities . Over the next 180 days, patient will:  take medications as prescribed and collaborate with provider on medication access solutions  Follow Up Plan: Telephone follow up appointment with care management team member scheduled for:  1 month               Medication Assistance: Patient states he has not gotten applications for Symbicort and Louanna Raw that I mailed 1 month ago. Will resend.   Patient's preferred pharmacy is:  [facility] [address], [place] [address] Phone: [phone] Fax: [phone]  [place], [place] - [place] [address] [place] [address] Phone: [phone] Fax: [phone]  [place], [place] [address], [address] [place], [place] [address] [place] [address] Phone: [phone] Fax: [phone]   Follow Up:  Patient agrees to Care Plan and Follow-up.  Plan: Telephone follow up appointment with care management team member scheduled for:  2 to 3 months and The care management team will reach out to the patient again over the next 45  days.  Cherre Robins, PharmD Clinical Pharmacist [place] [facility]

## 2021-03-23 NOTE — Patient Instructions (Addendum)
Mr. Belton,  It was a pleasure speaking with you today. Please feel free to contact me if you have any questions or concerns. Below is information regarding you health goals. I have also included applications for patient assistance for Symbicort and Dara Lords, PharmD Clinical Pharmacist [facility] [phone]  Visit Information  PATIENT GOALS: Goals Addressed            This Visit's Progress   . Chronic Care Management Pharmacy Care Plan       CARE PLAN ENTRY (see longitudinal plan of care for additional care plan information)  Current Barriers:  . Chronic Disease Management support, education, and care coordination needs related to Hypertension, Hyperlipidemia, Diabetes, COPD, AFib, GERD, BPH, Seizures   Hypertension BP Readings from Last 3 Encounters:  12/08/20 129/68  11/06/20 123/79  09/08/20 108/66   . Pharmacist Clinical Goal(s): o Over the next 90 days, patient will work with PharmD and providers to maintain BP goal <140/90 . Current regimen:  o Diltiazem XT 120mg  daily . Interventions: o Discussed BP goal . Patient self care activities - Over the next 90 days, patient will: o Maintain hypertension medication regimen.    Hyperlipidemia Lab Results  Component Value Date/Time   LDLCALC 76 03/09/2020 02:29 PM   LDLDIRECT 142.2 12/26/2008 10:32 AM   . Pharmacist Clinical Goal(s): o Over the next 90 days, patient will work with PharmD and providers to maintain LDL goal < 100 . Current regimen:  o Pravastatin 80mg  daily . Interventions: o Discussed LDL goal . Patient self care activities - Over the next 90 days, patient will: o Maintain cholesterol medication regimen.  o Refill for pravastatin will be due soon (around 03/19/2021). Don't forget to have refilled.   Diabetes Lab Results  Component Value Date/Time   HGBA1C 6.0 (H) 12/08/2020 04:09 PM   HGBA1C 5.9 03/09/2020 02:29 PM   . Pharmacist Clinical  Goal(s): o Over the next 90 days, patient will work with PharmD and providers to maintain A1c goal <7% . Current regimen:  o Diet and exercise recommeded . Interventions: o Discussed A1c goal . Patient self care activities - Over the next 90 days, patient will: o Maintain A1c <7% o Limit intake of sugar, potatoes, pasta, rice and bread.   COPD . Pharmacist Clinical Goal(s) o Over the next 90 days, patient will work with PharmD and providers to reduce symptoms associated with COPD and reduce barriers to access of preferred medication regimens . Current regimen:   Albuterol HFA 2 puffs every 6 hours as needed for wheezing  Flovent HFA 130mcg - inhaler 1 puff each morning and 1 puff each evening . Interventions: o Discussed patient's preference for Symbicort device vs Flovent device o Coordinate with PCP to identify optimal inhaler therapy for patient based on his preferances, insurane formulary and patient assistance if available.  o Mailed patient assistance applications for [place] and [place] and [place] - will coordinate with provider about which inhaler she prefers for patient.  . Patient self care activities - Over the next 90 days, patient will: o Complete applications for patient assistance and return to office o Maintain medication regimen for COPD  AFib . Pharmacist Clinical Goal(s) o Over the next 90 days, patient will work with PharmD and providers to reduce risk of stroke associated with Afib and reduce barriers to preferred medication regimen . Current regimen:   Diltiazem XT 120mg  daily  Xarelto 20mg  daily . Interventions: o Mailed patient  PAP for Xarelto . Patient self care activities - Over the next 90 days, patient will: o Complete patient assistnce for Xarelto o Maintain current medication regimen for atrial fibrillation  Seizures . Pharmacist Clinical Goal(s) o Over the next 90 days, patient will work with PharmD and providers to remain seizure free . Current regimen:   o Levetiracetam 500mg  ER - take 2 tablets daily (stopped due to cost and made patient feel sick) . Interventions: o Check last cost of levetiracetam ER with pharmacy and is $47/month o Reviewed pharmacy benefits / formulary (generic levetiracetam = $8 but generic levetiracetam ER = $47) o Discussed importance of taking levetiracetam with patient o Recommended he discuss medication, side effects and possible alternatives with neurology office. Will also coordinate with them as needed to find acceptable alternative for patient . Patient self care activities - Over the next 90 days, patient will: o Call neurology to make follow up appointment with [facility] [address], [place], [place] [address] [phone]  Medication management . Pharmacist Clinical Goal(s): o Over the next 90 days, patient will work with PharmD and providers to maintain optimal medication adherence . Current pharmacy: [facility] . Interventions o Comprehensive medication review performed. o Continue current medication management strategy . Patient self care activities - Over the next 90 days, patient will: o Focus on medication adherence by filling and taking medications appropriately  o Take medications as prescribed o Report any questions or concerns to PharmD and/or provider(s)  Please see past updates related to this goal by clicking on the "Past Updates" button in the selected goal         The patient verbalized understanding of instructions, educational materials, and care plan provided today and agreed to receive a mailed copy of patient instructions, educational materials, and care plan.   Telephone follow up appointment with care management team member scheduled for: 2 to 3 months  Cherre Robins, PharmD Clinical Pharmacist [facility]

## 2021-04-06 ENCOUNTER — Telehealth (HOSPITAL_COMMUNITY): Payer: Self-pay | Admitting: Emergency Medicine

## 2021-04-06 NOTE — Telephone Encounter (Signed)
Attempted to call patient regarding upcoming cardiac CT appointment. °Left message on voicemail with name and callback number °Cristel Rail RN Navigator Cardiac Imaging °[facility] °[phone] Office °[phone] Cell ° °

## 2021-04-10 ENCOUNTER — Ambulatory Visit (HOSPITAL_COMMUNITY)
Admission: RE | Admit: 2021-04-10 | Discharge: 2021-04-10 | Disposition: A | Discharge status: Home / Self Care | Payer: Medicare Other | Source: Ambulatory Visit | Attending: Cardiology | Admitting: Cardiology

## 2021-04-10 ENCOUNTER — Ambulatory Visit: Payer: Medicare Other | Admitting: Family

## 2021-04-10 ENCOUNTER — Other Ambulatory Visit: Payer: Self-pay

## 2021-04-10 ENCOUNTER — Encounter (HOSPITAL_COMMUNITY): Payer: Self-pay

## 2021-04-10 DIAGNOSIS — R079 Chest pain, unspecified: Secondary | ICD-10-CM | POA: Diagnosis not present

## 2021-04-10 DIAGNOSIS — I2510 Atherosclerotic heart disease of native coronary artery without angina pectoris: Secondary | ICD-10-CM | POA: Insufficient documentation

## 2021-04-10 MED ORDER — NITROGLYCERIN 0.4 MG SL SUBL: 0.8000 mg | SUBLINGUAL_TABLET | Freq: Once | SUBLINGUAL | Status: AC

## 2021-04-10 MED ORDER — NITROGLYCERIN 0.4 MG SL SUBL
SUBLINGUAL_TABLET | SUBLINGUAL | Status: AC
  Administered 2021-04-10: 0.8 mg via SUBLINGUAL
  Filled 2021-04-10: qty 2

## 2021-04-10 MED ORDER — IOHEXOL 350 MG/ML SOLN
100.0000 mL | Freq: Once | INTRAVENOUS | Status: AC | PRN
  Administered 2021-04-10: 100 mL via INTRAVENOUS

## 2021-04-11 ENCOUNTER — Other Ambulatory Visit: Payer: Self-pay

## 2021-04-11 ENCOUNTER — Other Ambulatory Visit (HOSPITAL_BASED_OUTPATIENT_CLINIC_OR_DEPARTMENT_OTHER): Payer: Self-pay

## 2021-04-11 ENCOUNTER — Encounter: Payer: Self-pay | Admitting: Family

## 2021-04-11 ENCOUNTER — Ambulatory Visit (INDEPENDENT_AMBULATORY_CARE_PROVIDER_SITE_OTHER): Payer: Medicare Other | Admitting: Family

## 2021-04-11 ENCOUNTER — Other Ambulatory Visit: Payer: Self-pay | Admitting: Physician Assistant

## 2021-04-11 VITALS — BP 104/65 | HR 60 | Temp 97.7°F | Resp 16 | Ht 69.0 in | Wt 166.4 lb

## 2021-04-11 DIAGNOSIS — I10 Essential (primary) hypertension: Secondary | ICD-10-CM

## 2021-04-11 DIAGNOSIS — R569 Unspecified convulsions: Secondary | ICD-10-CM

## 2021-04-11 DIAGNOSIS — J449 Chronic obstructive pulmonary disease, unspecified: Secondary | ICD-10-CM | POA: Diagnosis not present

## 2021-04-11 DIAGNOSIS — E119 Type 2 diabetes mellitus without complications: Secondary | ICD-10-CM

## 2021-04-11 DIAGNOSIS — Z1159 Encounter for screening for other viral diseases: Secondary | ICD-10-CM

## 2021-04-11 DIAGNOSIS — E785 Hyperlipidemia, unspecified: Secondary | ICD-10-CM

## 2021-04-11 DIAGNOSIS — K219 Gastro-esophageal reflux disease without esophagitis: Secondary | ICD-10-CM | POA: Diagnosis not present

## 2021-04-11 DIAGNOSIS — N529 Male erectile dysfunction, unspecified: Secondary | ICD-10-CM | POA: Insufficient documentation

## 2021-04-11 MED ORDER — ADVAIR HFA 115-21 MCG/ACT IN AERO
2.0000 | INHALATION_SPRAY | Freq: Two times a day (BID) | RESPIRATORY_TRACT | 5 refills | Status: DC
  Filled 2021-04-11: qty 12, 30d supply, fill #0

## 2021-04-11 MED ORDER — SILDENAFIL CITRATE 20 MG PO TABS
ORAL_TABLET | ORAL | 1 refills | Status: DC
  Filled 2021-04-11: qty 30, 15d supply, fill #0

## 2021-04-11 MED FILL — Rivaroxaban Tab 20 MG: ORAL | 30 days supply | Qty: 30 | Fill #0 | Status: AC

## 2021-04-11 NOTE — Assessment & Plan Note (Signed)
BP Readings from Last 3 Encounters:  04/11/21 104/65  04/10/21 110/62  03/12/21 122/68   Continue lopressor 50mg  and diltiazem 120mg .

## 2021-04-11 NOTE — Assessment & Plan Note (Signed)
Lab Results  Component Value Date   HGBA1C 6.0 (H) 12/08/2020   HGBA1C 5.9 03/09/2020   HGBA1C 6.2 11/20/2018   Lab Results  Component Value Date   MICROALBUR 1.0 06/01/2020   LDLCALC 76 03/09/2020   CREATININE 0.82 03/12/2021

## 2021-04-11 NOTE — Patient Instructions (Signed)
Please complete lab work prior to leaving.   

## 2021-04-11 NOTE — Assessment & Plan Note (Addendum)
Lab Results  Component Value Date   CHOL 157 03/09/2020   HDL 50.10 03/09/2020   LDLCALC 76 03/09/2020   LDLDIRECT 142.2 12/26/2008   TRIG 154.0 (H) 03/09/2020   CHOLHDL 3 03/09/2020   I will ask lab to add on lipid panel today if able. Continue pravastatin 80 mg.

## 2021-04-11 NOTE — Assessment & Plan Note (Signed)
New. Trial of revatio 20mg  1-2 tabs PO PRN.

## 2021-04-11 NOTE — Assessment & Plan Note (Signed)
No recent seizure activities. I recommended that patient not stop his Keppra unless instructed to do so by neurology.  We discussed that he is at increased risk of brain bleed if he seizes and hits his head due to [place].

## 2021-04-11 NOTE — Progress Notes (Signed)
Subjective:   By signing my name below, I, Robert Bates, attest that this documentation has been prepared under the direction and in the presence of [name] NP. 04/11/2021      Patient ID: Robert Bates, male    DOB: 1941-05-08, 80 y.o.   MRN: [id number]  Chief Complaint  Patient presents with  . Diabetes    Here for follow up  . Hypertension    Here for follow up    HPI Patient is in today for a office visit. He reports that he is experiencing SOB lately after walking for a long distance.  He reports that he has had no recent seizures. He stopped taking 1000 mg levetiracetam 2x daily PO to manage his seizures. He currently works 5 days a week and is thinking of cutting back to 3 days.   Screening- He is interested in a hepatitis C screening. Acid Reflux- He continues taking 40 mg Prilosec daily PO and reports having no acid reflux symptoms.  Blood pressure- His blood pressure is controlled this visit. He continues taking 120 mg diltiazem daily PO and 50 mg metoprolol tartrate daily PO. COPD- He reports using albuterol more frequently. He does not use Advair at this time. He has run out of Flovent.  Erectiles dysfunction- He recently complains of erectile dysfunction.     Health Maintenance Due  Topic Date Due  . Pneumococcal Vaccine 74-49 Years old (1 of 4 - PCV13) Never done  . OPHTHALMOLOGY EXAM  Never done  . Hepatitis C Screening  Never done  . Zoster Vaccines- Shingrix (1 of 2) Never done  . TETANUS/TDAP  07/25/2018  . URINE MICROALBUMIN  06/01/2021    Past Medical History:  Diagnosis Date  . Anxiety   . Arthritis   . 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  [name] COPD (chronic obstructive pulmonary disease) ([place])   . Diabetes type 2, controlled ([place]) 07/31/2017  . Dysrhythmia    a fib  . GERD (gastroesophageal reflux disease)   . Hematuria    refuses work up or  referral - understands risks of morbidity / mortality - 11/2008, 12/2008  . History of hiatal hernia   . History of kidney stones   . Hyperlipemia   . Meningioma ([place]) 10/25/2013   Follows with Dr. [name].   . Peripheral vascular disease ([place])    Abdominal Aortic Aneursym  . Pneumonia    as a child  . Radiation 09/18/15-10/25/15   left lower lobe 70.2 Gy  . Seizures ([place]) 02/18/2020  . Tobacco abuse     Past Surgical History:  Procedure Laterality Date  . CHOLECYSTECTOMY N/A 07/23/2017   Procedure: LAPAROSCOPIC CHOLECYSTECTOMY;  Surgeon: Kinsinger, Arta Bruce, MD;  Location: [facility];  Service: General;  Laterality: N/A;  . COLONOSCOPY    . EYE SURGERY Bilateral    Cataracts removed w/ lens implant  . HERNIA REPAIR     Left 36 years ago . Right inguinal hernia repair 10-01-17 Dr. Kieth Brightly  . INGUINAL HERNIA REPAIR Right 10/01/2017   Procedure: RIGHT INGUINAL HERNIA REPAIR WITH MESH;  Surgeon: Kinsinger, Arta Bruce, MD;  Location: [facility];  Service: General;  Laterality: Right;  TAP BLOCK  . INSERTION OF MESH Right 10/01/2017   Procedure: INSERTION OF MESH;  Surgeon: Kinsinger, Arta Bruce, MD;  Location: [facility];  Service: General;  Laterality: Right;  . TONSILLECTOMY    .  TONSILLECTOMY    . VIDEO BRONCHOSCOPY Bilateral 07/26/2015   Procedure: VIDEO BRONCHOSCOPY WITH FLUORO;  Surgeon: [name], MD;  Location: [facility];  Service: Cardiopulmonary;  Laterality: Bilateral;  . VIDEO BRONCHOSCOPY WITH ENDOBRONCHIAL NAVIGATION N/A 08/23/2015   Procedure: VIDEO BRONCHOSCOPY WITH ENDOBRONCHIAL NAVIGATION;  Surgeon: Grace Isaac, MD;  Location: [facility];  Service: Thoracic;  Laterality: N/A;  . VIDEO BRONCHOSCOPY WITH ENDOBRONCHIAL ULTRASOUND N/A 08/23/2015   Procedure: VIDEO BRONCHOSCOPY WITH ENDOBRONCHIAL ULTRASOUND;  Surgeon: Grace Isaac, MD;  Location: [facility];  Service: Thoracic;  Laterality: N/A;    Family History  Problem Relation Age of Onset  . Leukemia Father    . Emphysema Father   . Learning disabilities Son   . Leukemia Other   . Stroke Other     Social History   Socioeconomic History  . Marital status: Married    Spouse name: Not on file  . Number of children: 2  . Years of education: Not on file  . Highest education level: Not on file  Occupational History  . Occupation: Retired    Fish farm manager: [employer]    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[place] Use  . Smoking status: Former Smoker    Packs/day: 1.00    Years: 57.00    Pack years: 57.00    Quit date: 08/08/2015    Years since quitting: 5.6  . Smokeless tobacco: Former Systems developer    Types: Chew    Quit date: 11/04/1958  . Tobacco comment: occasional use sneaks around  Vaping Use  . Vaping Use: Every day  Substance and Sexual Activity  . Alcohol use: Not Currently    Alcohol/week: 0.0 standard drinks  . Drug use: No  . Sexual activity: Yes  Other Topics Concern  . Not on file  Social History Narrative  . Not on file   Social Determinants of Health   Financial Resource Strain: Medium Risk  . Difficulty of Paying Living Expenses: Somewhat hard  Food Insecurity: Not on file  Transportation Needs: Not on file  Physical Activity: Inactive  . Days of Exercise per Week: 0 days  . Minutes of Exercise per Session: 0 min  Stress: Not on file  Social Connections: Not on file  Intimate Partner Violence: Not on file    Outpatient Medications Prior to Visit  Medication Sig Dispense Refill  . albuterol (VENTOLIN HFA) 108 (90 Base) MCG/ACT inhaler Inhale 2 puffs into the lungs every 6 (six) hours as needed for wheezing or shortness of breath. 8.5 g 5  . diltiazem (CARDIZEM CD) 120 MG 24 hr capsule Take 120 mg by mouth daily.    . diphenhydrAMINE (BANOPHEN) 25 MG tablet Take 1 tablet (25 mg total) by mouth once 1 hour before testing 100 tablet 0  . levETIRAcetam (KEPPRA XR) 500 MG 24 hr tablet Take 2 tablets (1,000 mg total) by mouth daily. (Patient not taking:  Reported on 03/19/2021) 60 tablet 5  . metoprolol tartrate (LOPRESSOR) 50 MG tablet Take 1 tablet (50 mg total) by mouth once 90-120 minutes before testing 1 tablet 0  . nitroGLYCERIN (NITROSTAT) 0.4 MG SL tablet Place 1 tablet (0.4 mg total) under the tongue every 5 (five) minutes as needed for chest pain. 75 tablet 0  . omeprazole (PRILOSEC) 40 MG capsule Take 1 capsule (40 mg total) by mouth daily. 30 capsule 5  . pravastatin (PRAVACHOL) 80 MG tablet TAKE 1 TABLET (80 MG TOTAL) BY MOUTH AT BEDTIME. [place]  tablet 1  . predniSONE (DELTASONE) 50 MG tablet Take 1 tablet by mouth 13 hours prior to test, then 1 tablet 7 hours prior to test, then 1 tablet 1 hour prior to test. (Patient not taking: Reported on 03/19/2021) 3 tablet 0  . rivaroxaban (XARELTO) 20 MG TABS tablet TAKE 1 TABLET BY MOUTH DAILY WITH SUPPER 30 tablet 5  . tamsulosin (FLOMAX) 0.4 MG CAPS capsule TAKE 1 CAPSULE (0.4 MG TOTAL) BY MOUTH DAILY. 30 capsule 3  . fluticasone (FLOVENT HFA) 110 MCG/ACT inhaler Inhale 1 puff into the lungs in the morning and at bedtime. 1 each 5  . Fluticasone-Salmeterol (ADVAIR) 250-50 MCG/DOSE AEPB INHALE 1 PUFF BY MOUTH INTO THE LUNGS TWICE DAILY (Patient not taking: Reported on 03/19/2021) 60 each 3   No facility-administered medications prior to visit.    Allergies  Allergen Reactions  . Iodine Other (See Comments)    neck swells  . Iohexol Swelling    Neck and gland swelling per patient.    Review of Systems  Respiratory: Positive for shortness of breath.        Objective:    Physical Exam Constitutional:      General: He is not in acute distress.    Appearance: Normal appearance. He is not ill-appearing.  HENT:     Head: Normocephalic and atraumatic.     Right Ear: External ear normal.     Left Ear: External ear normal.  Eyes:     Extraocular Movements: Extraocular movements intact.     Pupils: Pupils are equal, round, and reactive to light.  Cardiovascular:     Rate and Rhythm:  Normal rate and regular rhythm.     Pulses: Normal pulses.     Heart sounds: Normal heart sounds. No murmur heard. No gallop.   Pulmonary:     Effort: Pulmonary effort is normal. No respiratory distress.     Breath sounds: Wheezing present. No rhonchi or rales.     Comments: Diminished breath sounds throughout with a wheeze on the left side. Musculoskeletal:     Right lower leg: 2+ Edema present.     Left lower leg: 2+ Edema present.  Skin:    General: Skin is warm and dry.  Neurological:     Mental Status: He is alert and oriented to person, place, and time.  Psychiatric:        Behavior: Behavior normal.     BP 104/65 (BP Location: Right Arm, Patient Position: Sitting, Cuff Size: Small)   Pulse 60   Temp 97.7 F (36.5 C) (Oral)   Resp 16   Ht 5\' 9"  (1.753 m)   Wt 166 lb 6.4 oz (75.5 kg)   SpO2 98%   BMI 24.57 kg/m  Wt Readings from Last 3 Encounters:  04/11/21 166 lb 6.4 oz (75.5 kg)  03/12/21 165 lb (74.8 kg)  03/07/21 160 lb (72.6 kg)       Assessment & Plan:   Problem List Items Addressed This Visit      Unprioritized   Seizures ([place])    No recent seizure activities. I recommended that patient not stop his Keppra unless instructed to do so by neurology.  We discussed that he is at increased risk of brain bleed if he seizes and hits his head due to [place].        Hyperlipidemia    Lab Results  Component Value Date   CHOL 157 03/09/2020   HDL 50.10 03/09/2020   LDLCALC 76 03/09/2020  LDLDIRECT 142.2 12/26/2008   TRIG 154.0 (H) 03/09/2020   CHOLHDL 3 03/09/2020         Relevant Medications   sildenafil (REVATIO) 20 MG tablet   HTN (hypertension)    BP Readings from Last 3 Encounters:  04/11/21 104/65  04/10/21 110/62  03/12/21 122/68   Continue lopressor 50mg  and diltiazem 120mg .       Relevant Medications   sildenafil (REVATIO) 20 MG tablet   GERD    Stable on omeprazole 40mg . Continue same.       Erectile dysfunction    New. Trial of  revatio 20mg  1-2 tabs PO PRN.       Diabetes type 2, controlled ([place])    Lab Results  Component Value Date   HGBA1C 6.0 (H) 12/08/2020   HGBA1C 5.9 03/09/2020   HGBA1C 6.2 11/20/2018   Lab Results  Component Value Date   MICROALBUR 1.0 06/01/2020   LDLCALC 76 03/09/2020   CREATININE 0.82 03/12/2021         Relevant Orders   Hemoglobin A1c   COPD GOLD II     Pt reports that he often has SOB with walking.  Using ventolin frequently. Ran out of advair. Will restart as HFA as he has trouble with the discus. Continue albuterol prn.       Relevant Medications   fluticasone-salmeterol (ADVAIR HFA) 115-21 MCG/ACT inhaler    Other Visit Diagnoses    Need for hepatitis C screening test    -  Primary   Relevant Orders   Hepatitis C Antibody       Meds ordered this encounter  Medications  . fluticasone-salmeterol (ADVAIR HFA) 115-21 MCG/ACT inhaler    Sig: Inhale 2 puffs into the lungs 2 (two) times daily.    Dispense:  12 g    Refill:  5    Order Specific Question:   Supervising Provider    Answer:   Penni Homans A [[id number]]  . sildenafil (REVATIO) 20 MG tablet    Sig: Take 1-2 tabs by mouth prior to sexual activity    Dispense:  30 tablet    Refill:  1    Order Specific Question:   Supervising Provider    Answer:   Penni Homans A [[id number]]    I, [name] NP, personally preformed the services described in this documentation.  All medical record entries made by the scribe were at my direction and in my presence.  I have reviewed the chart and discharge instructions (if applicable) and agree that the record reflects my personal performance and is accurate and complete. 04/11/2021   I,Robert Bates,acting as a scribe for Nance Pear, NP.,have documented all relevant documentation on the behalf of Nance Pear, NP,as directed by  Nance Pear, NP while in the presence of Nance Pear, NP.   Nance Pear, NP

## 2021-04-11 NOTE — Assessment & Plan Note (Signed)
Pt reports that he often has SOB with walking.  Using ventolin frequently. Ran out of advair. Will restart as HFA as he has trouble with the discus. Continue albuterol prn.

## 2021-04-11 NOTE — Assessment & Plan Note (Signed)
Stable on omeprazole 40mg . Continue same.

## 2021-04-11 NOTE — Telephone Encounter (Signed)
Xarelto 20mg  refill request received. Pt is 80 years old, weight-75.5kg, Crea-0.82 on 03/12/2021, last seen by Kathyrn Drown, NP on 03/12/2021, Diagnosis-Afib, CrCl-78.60ml/min; Dose is appropriate based on dosing criteria. Will send in refill to requested pharmacy.

## 2021-04-12 LAB — LIPID PANEL
Cholesterol: 146 mg/dL (ref <200)
HDL: 63 mg/dL (ref >=40)
LDL Cholesterol (Calc): 62 mg/dL (calc)
Non-HDL Cholesterol (Calc): 83 mg/dL (calc) (ref <130)
Total CHOL/HDL Ratio: 2.3 (calc) (ref <5.0)
Triglycerides: 119 mg/dL (ref <150)

## 2021-04-12 LAB — HEPATITIS C ANTIBODY
Hepatitis C Ab: NONREACTIVE
SIGNAL TO CUT-OFF: 0 (ref <1.00)

## 2021-04-13 ENCOUNTER — Other Ambulatory Visit (HOSPITAL_BASED_OUTPATIENT_CLINIC_OR_DEPARTMENT_OTHER): Payer: Self-pay

## 2021-04-15 ENCOUNTER — Other Ambulatory Visit: Payer: Self-pay | Admitting: Cardiology

## 2021-04-15 DIAGNOSIS — R931 Abnormal findings on diagnostic imaging of heart and coronary circulation: Secondary | ICD-10-CM

## 2021-04-16 ENCOUNTER — Telehealth: Payer: Self-pay | Admitting: Cardiology

## 2021-04-16 ENCOUNTER — Ambulatory Visit (HOSPITAL_COMMUNITY)
Admission: RE | Admit: 2021-04-16 | Discharge: 2021-04-16 | Disposition: A | Discharge status: Home / Self Care | Payer: Medicare Other | Source: Ambulatory Visit | Attending: Cardiology | Admitting: Cardiology

## 2021-04-16 DIAGNOSIS — R079 Chest pain, unspecified: Secondary | ICD-10-CM | POA: Insufficient documentation

## 2021-04-16 DIAGNOSIS — R931 Abnormal findings on diagnostic imaging of heart and coronary circulation: Secondary | ICD-10-CM | POA: Diagnosis not present

## 2021-04-16 DIAGNOSIS — I2510 Atherosclerotic heart disease of native coronary artery without angina pectoris: Secondary | ICD-10-CM | POA: Diagnosis not present

## 2021-04-16 LAB — HEMOGLOBIN A1C: Hgb A1c MFr Bld: 6.5 % (ref 4.6–6.5)

## 2021-04-18 ENCOUNTER — Other Ambulatory Visit (HOSPITAL_BASED_OUTPATIENT_CLINIC_OR_DEPARTMENT_OTHER): Payer: Self-pay

## 2021-04-18 ENCOUNTER — Telehealth (INDEPENDENT_AMBULATORY_CARE_PROVIDER_SITE_OTHER): Payer: Medicare Other | Admitting: Cardiology

## 2021-04-18 ENCOUNTER — Telehealth: Payer: Self-pay

## 2021-04-18 ENCOUNTER — Telehealth: Payer: Self-pay | Admitting: Cardiovascular Disease

## 2021-04-18 ENCOUNTER — Other Ambulatory Visit: Payer: Self-pay

## 2021-04-18 VITALS — Wt 164.0 lb

## 2021-04-18 DIAGNOSIS — I480 Paroxysmal atrial fibrillation: Secondary | ICD-10-CM

## 2021-04-18 DIAGNOSIS — R079 Chest pain, unspecified: Secondary | ICD-10-CM | POA: Diagnosis not present

## 2021-04-18 DIAGNOSIS — Z720 Tobacco use: Secondary | ICD-10-CM | POA: Diagnosis not present

## 2021-04-18 DIAGNOSIS — E785 Hyperlipidemia, unspecified: Secondary | ICD-10-CM

## 2021-04-18 DIAGNOSIS — I712 Thoracic aortic aneurysm, without rupture, unspecified: Secondary | ICD-10-CM

## 2021-04-18 DIAGNOSIS — I700 Atherosclerosis of aorta: Secondary | ICD-10-CM

## 2021-04-18 DIAGNOSIS — R931 Abnormal findings on diagnostic imaging of heart and coronary circulation: Secondary | ICD-10-CM

## 2021-04-18 DIAGNOSIS — J449 Chronic obstructive pulmonary disease, unspecified: Secondary | ICD-10-CM

## 2021-04-18 MED ORDER — PREDNISONE 50 MG PO TABS
ORAL_TABLET | ORAL | 0 refills | Status: DC
  Filled 2021-04-18: qty 3, 1d supply, fill #0

## 2021-04-18 NOTE — Progress Notes (Signed)
Virtual Visit via Telephone Note   This visit type was conducted due to national recommendations for restrictions regarding the COVID-19 Pandemic (e.g. social distancing) in an effort to limit this patient's exposure and mitigate transmission in our community.  Due to his co-morbid illnesses, this patient is at least at moderate risk for complications without adequate follow up.  This format is felt to be most appropriate for this patient at this time.  The patient did not have access to video technology/had technical difficulties with video requiring transitioning to audio format only (telephone).  All issues noted in this document were discussed and addressed.  No physical exam could be performed with this format.  Please refer to the patient's chart for his  consent to telehealth for [facility].    Date:  04/18/2021   ID:  Michele Kerlin, DOB 11-18-1940, MRN [id number] The patient was identified using 2 identifiers.  Patient Location: Home Provider Location: Home Office   PCP:  [name], NP   [facility] Providers Cardiologist:  Mertie Moores, MD { Evaluation Performed:  Follow-Up Visit  Chief Complaint:  Abnormal CCTA; follow up   History of Present Illness:    Elihu Milstein is a 80 y.o. male with a hx of PAF, tobacco use with COPD and history of lung CA, peripheral vascular disease, HLD, chronic diastolic CHF and DM2.  He has been anticoagulated with Xarelto 20 mg daily.  He was seen by Dr. Acie Fredrickson 09/08/2020 in follow-up of PAF and was doing well with no specific issues. He then saw his PCP on 03/07/2021 for headache with head trauma and atypical chest pain. I was previously unable to view much of the notations however it appears an EKG was performed with no acute changes.  He was sent for head CT imaging to rule out intracranial bleed given headache and use of Xarelto. Head CT with no acute abnormality, no change in 2 meningiomas.  He was referred back to cardiology  for atypical chest pain.   He has no prior history of CAD. Chest imaging from 06/20/2020 with aortic atherosclerosis of the great vessels including the coronary arteries with a calcified atherosclerotic plaque in the left main, left anterior descending, left circumflex and right coronary arteries.  Last echocardiogram 07/2017 LVEF at 55%, G2 DD.   He was then seen by myself 03/12/21 for the evaluation of chest pain. 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 He tried massaging his chest to see if his symptoms would improve however they did not. His symptoms lasted approximately 9 hours in duration. He stated that his symptoms did not return until the monring of his last appointment with me. Sympto[ms described as a pressure without radiation to his arms, jaw or back. He has no prior hx of CAD however has CV risk factors and recent chest CT from last year that shows aortic atherosclerosis of the great vessels including the coronary arteries with a calcified atherosclerotic plaque in the left main, left anterior descending, left circumflex and right coronary arteries. Given this, plan was to pursue CCTA for further evalution.   CCTA performed 04/16/21 which showed possible flow limiting lesions in the mLAD, mLCX and mid Diag vessels. Recommendations were to pursue LHC for further investigation.   Today Mr. Vivona reports he has not had any more significant episodes of chest pain however does report occasional "twinges" while  working ooutside in the heat. He denies SOB, LE edema, palpitations, diaphroesis, N/V or suncope. He continues to work as a Administrator and is worried that he will not pass his transportation exam if he has a stent placed. We discussed the reprecutions of not having further work up including placing himself and others in danger. Risks and benefits of LHC discussed and all questions answered. Hre is aware  that he will have to hold Xarelto prior to procedure.    The patient does not have symptoms concerning for COVID-19 infection (fever, chills, cough, or new shortness of breath).   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   COPD (chronic obstructive pulmonary disease) ([place])    Diabetes type 2, controlled ([place]) 07/31/2017   Dysrhythmia    a fib   GERD (gastroesophageal reflux disease)    Hematuria    refuses work up or referral - understands risks of morbidity / mortality - 11/2008, 12/2008   History of hiatal hernia    History of kidney stones    Hyperlipemia    Meningioma ([place]) 10/25/2013   Follows with Dr. [name].    Peripheral vascular disease ([place])    Abdominal Aortic Aneursym   Pneumonia    as a child   Radiation 09/18/15-10/25/15   left lower lobe 70.2 Gy   Seizures ([place]) 02/18/2020   Tobacco abuse    Past Surgical History:  Procedure Laterality Date   CHOLECYSTECTOMY N/A 07/23/2017   Procedure: LAPAROSCOPIC CHOLECYSTECTOMY;  Surgeon: Kinsinger, Arta Bruce, MD;  Location: [facility];  Service: General;  Laterality: N/A;   COLONOSCOPY     EYE SURGERY Bilateral    Cataracts removed w/ lens implant   HERNIA REPAIR     Left 36 years ago . Right inguinal hernia repair 10-01-17 Dr. Kieth Brightly   INGUINAL HERNIA REPAIR Right 10/01/2017   Procedure: RIGHT INGUINAL HERNIA REPAIR WITH MESH;  Surgeon: Kinsinger, Arta Bruce, MD;  Location: [facility];  Service: General;  Laterality: Right;  TAP BLOCK   INSERTION OF MESH Right 10/01/2017   Procedure: INSERTION OF MESH;  Surgeon: Kieth Brightly Arta Bruce, MD;  Location: [facility];  Service: General;  Laterality: Right;   TONSILLECTOMY     TONSILLECTOMY     VIDEO BRONCHOSCOPY Bilateral 07/26/2015   Procedure: VIDEO BRONCHOSCOPY WITH FLUORO;  Surgeon: [name], MD;  Location: [facility];   Service: Cardiopulmonary;  Laterality: Bilateral;   VIDEO BRONCHOSCOPY WITH ENDOBRONCHIAL NAVIGATION N/A 08/23/2015   Procedure: VIDEO BRONCHOSCOPY WITH ENDOBRONCHIAL NAVIGATION;  Surgeon: Grace Isaac, MD;  Location: [facility];  Service: Thoracic;  Laterality: N/A;   VIDEO BRONCHOSCOPY WITH ENDOBRONCHIAL ULTRASOUND N/A 08/23/2015   Procedure: VIDEO BRONCHOSCOPY WITH ENDOBRONCHIAL ULTRASOUND;  Surgeon: Grace Isaac, MD;  Location: [facility];  Service: Thoracic;  Laterality: N/A;     Current Meds  Medication Sig   albuterol (VENTOLIN HFA) 108 (90 Base) MCG/ACT inhaler Inhale 2 puffs into the lungs every 6 (six) hours as needed for wheezing or shortness of breath.   diltiazem (CARDIZEM CD) 120 MG 24 hr capsule Take 120 mg by mouth daily.   fluticasone-salmeterol (ADVAIR HFA) 115-21 MCG/ACT inhaler Inhale 2 puffs into the lungs 2 (two) times daily.   nitroGLYCERIN (NITROSTAT) 0.4 MG SL tablet Place 1 tablet (0.4 mg total) under the tongue  every 5 (five) minutes as needed for chest pain.   omeprazole (PRILOSEC) 40 MG capsule Take 1 capsule (40 mg total) by mouth daily.   pravastatin (PRAVACHOL) 80 MG tablet TAKE 1 TABLET (80 MG TOTAL) BY MOUTH AT BEDTIME.   rivaroxaban (XARELTO) 20 MG TABS tablet TAKE 1 TABLET BY MOUTH DAILY WITH SUPPER   tamsulosin (FLOMAX) 0.4 MG CAPS capsule TAKE 1 CAPSULE (0.4 MG TOTAL) BY MOUTH DAILY.     Allergies:   Iodine and Iohexol   Social History   Tobacco Use   Smoking status: Former    Packs/day: 1.00    Years: 57.00    Pack years: 57.00    Types: Cigarettes    Quit date: 08/08/2015    Years since quitting: 5.6   Smokeless tobacco: Former    Types: Chew    Quit date: 11/04/1958   Tobacco comments:    occasional use sneaks around  Vaping Use   Vaping Use: Every day  Substance Use Topics   Alcohol use: Not Currently    Alcohol/week: 0.0 standard drinks   Drug use: No     Family Hx: The patient's family history includes Emphysema in his father;  Learning disabilities in his son; Leukemia in his father and another family member; Stroke in an other family member.  ROS:   Please see the history of present illness.     All other systems reviewed and are negative.  Prior CV studies:   The following studies were reviewed today:  CCTA 04/16/21:  1. Left Main: No significant stenosis. LM FFR = 0.99.   2. LAD: Possible stenosis in mid LAD. Proximal FFR = 0.85, Mid FFR = 0.78, Distal FFR = 0.66. Possible stenosis in mid Diagonal. Proximal FFR = 0.85, Distal FFR = 0.74.   3. LCX: Possible stenosis of mid LCx. Proximal FFR = 0.93, Mid FFR = 0.57, Distal FFR = 0.56.   4. Ramus: No significant stenosis. Proximal FFR = 0.96. Vessel not modeled after the proximal portion.   5. RCA: No significant stenosis. Proximal FFR = 0.99, Mid FFR = 0.96, Distal FFR = 0.87.   IMPRESSION: 1. Coronary CT FFR demonstrates possible flow limiting lesions in the mid LAD, mid LCX and mid diagonal vessels.   2.  Recommend cardiac catheterization.   Echocardiogram 07/22/2017:  - Left ventricle: The cavity size was normal. Systolic function was    normal. The estimated ejection fraction was in the range of 55%    to 60%. Wall motion was normal; there were no regional wall    motion abnormalities. Features are consistent with a pseudonormal    left ventricular filling pattern, with concomitant abnormal    relaxation and increased filling pressure (grade 2 diastolic    dysfunction).  - Aortic valve: Valve area (VTI): 2.93 cm^2. Valve area (Vmax):    2.78 cm^2. Valve area (Vmean): 2.58 cm^2.  - Right atrium: The atrium was mildly dilated.  - Pulmonary arteries: Systolic pressure was mildly increased. PA    peak pressure: 38 mm Hg (S).    Chest CT 06/20/2020:   FINDINGS: Cardiovascular: Heart size is normal. Small amount of pericardial fluid and/or thickening anteriorly, similar to the prior study, and unlikely to be of hemodynamic significance at  this time. No associated pericardial calcification. There is aortic atherosclerosis, as well as atherosclerosis of the great vessels of the mediastinum and the coronary arteries, including calcified atherosclerotic plaque in the left main, left anterior descending, left circumflex and right  coronary arteries. 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  Labs/Other Tests and Data Reviewed:    EKG:  An ECG dated 03/12/21 was personally reviewed today and demonstrated:  NSR with HR 67bpm, QTc 413ms  Recent Labs: 06/20/2020: Hemoglobin 14.0; Platelet Count 229 12/08/2020: ALT 12 03/12/2021: BUN 20; Creatinine, Ser 0.82; Potassium 4.3; Sodium 142   Recent Lipid Panel Lab Results  Component Value Date/Time   CHOL 146 04/11/2021 02:21 PM   TRIG 119 04/11/2021 02:21 PM   HDL 63 04/11/2021 02:21 PM   CHOLHDL 2.3 04/11/2021 02:21 PM   LDLCALC 62 04/11/2021 02:21 PM   LDLDIRECT 142.2 12/26/2008 10:32 AM    Wt Readings from Last 3 Encounters:  04/18/21 164 lb (74.4 kg)  04/11/21 166 lb 6.4 oz (75.5 kg)  03/12/21 165 lb (74.8 kg)    Risk Assessment/Calculations:    CHA2DS2-VASc Score = 3  This indicates a 3.2% annual risk of stroke. The patient's score is based upon: CHF History: No HTN History: No Diabetes History: No Stroke History: No Vascular Disease History: Yes Age Score: 2 Gender Score: 0    Objective:    Vital Signs:  Wt 164 lb (74.4 kg)   BMI 24.22 kg/m    VITAL SIGNS:  reviewed GEN:  no acute distress NEURO:  alert and oriented x 3, no obvious focal deficit  ASSESSMENT & PLAN:    1.  Chest pain with abnormal CCTA: -Seen 03/12/21 with an episode of chest pain with previous chest CT concerning with calcifications in the left main, LAD, LCx and RCA. -CCTA performed 04/16/21 that showed evidence of CAD in the mLAD, mLCX and mid Diag vessels with recommendations to pursue LHC -Occasion chest pain "twinges" since last evaluation>>not used SL NTG -Cardiac  catheterization was discussed with the patient fully. The patient understands that risks include but are not limited to stroke (1 in 1000), death (1 in 25), kidney failure [usually temporary] (1 in 500), bleeding (1 in 200), allergic reaction [possibly serious] (1 in 200).  The patient understands and is willing to proceed.    Given recent onset of chest pain, plan is to proceed with CCTA for further evaluation. If significant lesions on imaging, plan to proceed with LHC. -Continue statin -No ASA in the setting of Xarelto>>however will need to hold for LHC -Has a contrast dye allergy therefore will pre-medicate with Prednisone Q13, Q7 and Q1H before cath  -Will also prescribe benadryl 25mg  1H prior to procedure   -Creatinine, 0.82 on 03/12/21   2.  PAF: -Maintaining NSR on last OV evaluation  -Continue with diltiazem 120 mg daily -Anticoagulated with Xarelto 20 mg daily -Needs to hold prior to cath    3.  COPD with lung CA: -Continues to smoke intermittently -Follows with oncology  -On multiple inhalers -No c/o SOB    4.  Ectasia of ascending thoracic aorta: -Measuring 4.0 cm in diameter -Will plan to follow closely -Maintain adequate BP   5.  HLD: -LDL, 62 on 04/11/21 -Continue pravastatin   6. Tobacco Use: -Continues to intermittently smoke -Cessation encouraged   Shared Decision Making/Informed Consent The risks [stroke (1 in 1000), death (1 in 1000), kidney failure [usually temporary] (1 in 500), bleeding (1 in 200), allergic reaction [possibly serious] (1 in 200)], benefits (diagnostic support and management of coronary artery disease) and alternatives of a cardiac catheterization were discussed in detail with Mr. Deady and he is willing to proceed.   COVID-19 Education: The signs and  symptoms of COVID-19 were discussed with the patient and how to seek care for testing (follow up with PCP or arrange E-visit). The importance of social distancing was discussed today.  Time:    Today, I have spent 15 minutes with the patient with telehealth technology discussing the above problems.     Medication Adjustments/Labs and Tests Ordered: Current medicines are reviewed at length with the patient today.  Concerns regarding medicines are outlined above.   Tests Ordered: No orders of the defined types were placed in this encounter.   Medication Changes: No orders of the defined types were placed in this encounter.   Follow Up:  In Person in 4 week(s)  Signed, Kathyrn Drown, NP  04/18/2021 1:16 PM    [facility]

## 2021-04-18 NOTE — H&P (View-Only) (Signed)
Virtual Visit via Telephone Note   This visit type was conducted due to national recommendations for restrictions regarding the COVID-19 Pandemic (e.g. social distancing) in an effort to limit this patient's exposure and mitigate transmission in our community.  Due to his co-morbid illnesses, this patient is at least at moderate risk for complications without adequate follow up.  This format is felt to be most appropriate for this patient at this time.  The patient did not have access to video technology/had technical difficulties with video requiring transitioning to audio format only (telephone).  All issues noted in this document were discussed and addressed.  No physical exam could be performed with this format.  Please refer to the patient's chart for his  consent to telehealth for [facility].    Date:  04/18/2021   ID:  Robert Bates, DOB 1941-06-12, MRN [id number] The patient was identified using 2 identifiers.  Patient Location: Home Provider Location: Home Office   PCP:  Robert Alar, NP   [facility] Providers Cardiologist:  Mertie Moores, MD { Evaluation Performed:  Follow-Up Visit  Chief Complaint:  Abnormal CCTA; follow up   History of Present Illness:    Robert Bates is a 80 y.o. male with a hx of PAF, tobacco use with COPD and history of lung CA, peripheral vascular disease, HLD, chronic diastolic CHF and DM2.  He has been anticoagulated with Xarelto 20 mg daily.  He was seen by Dr. Acie Fredrickson 09/08/2020 in follow-up of PAF and was doing well with no specific issues. He then saw his PCP on 03/07/2021 for headache with head trauma and atypical chest pain. I was previously unable to view much of the notations however it appears an EKG was performed with no acute changes.  He was sent for head CT imaging to rule out intracranial bleed given headache and use of Xarelto. Head CT with no acute abnormality, no change in 2 meningiomas.  He was referred back to cardiology  for atypical chest pain.   He has no prior history of CAD. Chest imaging from 06/20/2020 with aortic atherosclerosis of the great vessels including the coronary arteries with a calcified atherosclerotic plaque in the left main, left anterior descending, left circumflex and right coronary arteries.  Last echocardiogram 07/2017 LVEF at 55%, G2 DD.   He was then seen by myself 03/12/21 for the evaluation of chest pain. 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 He tried massaging his chest to see if his symptoms would improve however they did not. His symptoms lasted approximately 9 hours in duration. He stated that his symptoms did not return until the monring of his last appointment with me. Sympto[ms described as a pressure without radiation to his arms, jaw or back. He has no prior hx of CAD however has CV risk factors and recent chest CT from last year that shows aortic atherosclerosis of the great vessels including the coronary arteries with a calcified atherosclerotic plaque in the left main, left anterior descending, left circumflex and right coronary arteries. Given this, plan was to pursue CCTA for further evalution.   CCTA performed 04/16/21 which showed possible flow limiting lesions in the mLAD, mLCX and mid Diag vessels. Recommendations were to pursue LHC for further investigation.   Today [name] reports he has not had any more significant episodes of chest pain however does report occasional "twinges" while  working ooutside in the heat. He denies SOB, LE edema, palpitations, diaphroesis, N/V or suncope. He continues to work as a Administrator and is worried that he will not pass his transportation exam if he has a stent placed. We discussed the reprecutions of not having further work up including placing himself and others in danger. Risks and benefits of LHC discussed and all questions answered. Hre is aware  that he will have to hold Xarelto prior to procedure.    The patient does not have symptoms concerning for COVID-19 infection (fever, chills, cough, or new shortness of breath).   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   COPD (chronic obstructive pulmonary disease) ([place])    Diabetes type 2, controlled ([place]) 07/31/2017   Dysrhythmia    a fib   GERD (gastroesophageal reflux disease)    Hematuria    refuses work up or referral - understands risks of morbidity / mortality - 11/2008, 12/2008   History of hiatal hernia    History of kidney stones    Hyperlipemia    Meningioma ([place]) 10/25/2013   Follows with Dr. [name].    Peripheral vascular disease ([place])    Abdominal Aortic Aneursym   Pneumonia    as a child   Radiation 09/18/15-10/25/15   left lower lobe 70.2 Gy   Seizures ([place]) 02/18/2020   Tobacco abuse    Past Surgical History:  Procedure Laterality Date   CHOLECYSTECTOMY N/A 07/23/2017   Procedure: LAPAROSCOPIC CHOLECYSTECTOMY;  Surgeon: Kinsinger, Arta Bruce, MD;  Location: [facility];  Service: General;  Laterality: N/A;   COLONOSCOPY     EYE SURGERY Bilateral    Cataracts removed w/ lens implant   HERNIA REPAIR     Left 36 years ago . Right inguinal hernia repair 10-01-17 Dr. Kieth Brightly   INGUINAL HERNIA REPAIR Right 10/01/2017   Procedure: RIGHT INGUINAL HERNIA REPAIR WITH MESH;  Surgeon: Kinsinger, Arta Bruce, MD;  Location: [facility];  Service: General;  Laterality: Right;  TAP BLOCK   INSERTION OF MESH Right 10/01/2017   Procedure: INSERTION OF MESH;  Surgeon: Kieth Brightly Arta Bruce, MD;  Location: [facility];  Service: General;  Laterality: Right;   TONSILLECTOMY     TONSILLECTOMY     VIDEO BRONCHOSCOPY Bilateral 07/26/2015   Procedure: VIDEO BRONCHOSCOPY WITH FLUORO;  Surgeon: [name], MD;  Location: [facility];   Service: Cardiopulmonary;  Laterality: Bilateral;   VIDEO BRONCHOSCOPY WITH ENDOBRONCHIAL NAVIGATION N/A 08/23/2015   Procedure: VIDEO BRONCHOSCOPY WITH ENDOBRONCHIAL NAVIGATION;  Surgeon: Grace Isaac, MD;  Location: [facility];  Service: Thoracic;  Laterality: N/A;   VIDEO BRONCHOSCOPY WITH ENDOBRONCHIAL ULTRASOUND N/A 08/23/2015   Procedure: VIDEO BRONCHOSCOPY WITH ENDOBRONCHIAL ULTRASOUND;  Surgeon: Grace Isaac, MD;  Location: [facility];  Service: Thoracic;  Laterality: N/A;     Current Meds  Medication Sig   albuterol (VENTOLIN HFA) 108 (90 Base) MCG/ACT inhaler Inhale 2 puffs into the lungs every 6 (six) hours as needed for wheezing or shortness of breath.   diltiazem (CARDIZEM CD) 120 MG 24 hr capsule Take 120 mg by mouth daily.   fluticasone-salmeterol (ADVAIR HFA) 115-21 MCG/ACT inhaler Inhale 2 puffs into the lungs 2 (two) times daily.   nitroGLYCERIN (NITROSTAT) 0.4 MG SL tablet Place 1 tablet (0.4 mg total) under the tongue  every 5 (five) minutes as needed for chest pain.   omeprazole (PRILOSEC) 40 MG capsule Take 1 capsule (40 mg total) by mouth daily.   pravastatin (PRAVACHOL) 80 MG tablet TAKE 1 TABLET (80 MG TOTAL) BY MOUTH AT BEDTIME.   rivaroxaban (XARELTO) 20 MG TABS tablet TAKE 1 TABLET BY MOUTH DAILY WITH SUPPER   tamsulosin (FLOMAX) 0.4 MG CAPS capsule TAKE 1 CAPSULE (0.4 MG TOTAL) BY MOUTH DAILY.     Allergies:   Iodine and Iohexol   Social History   Tobacco Use   Smoking status: Former    Packs/day: 1.00    Years: 57.00    Pack years: 57.00    Types: Cigarettes    Quit date: 08/08/2015    Years since quitting: 5.6   Smokeless tobacco: Former    Types: Chew    Quit date: 11/04/1958   Tobacco comments:    occasional use sneaks around  Vaping Use   Vaping Use: Every day  Substance Use Topics   Alcohol use: Not Currently    Alcohol/week: 0.0 standard drinks   Drug use: No     Family Hx: The patient's family history includes Emphysema in his father;  Learning disabilities in his son; Leukemia in his father and another family member; Stroke in an other family member.  ROS:   Please see the history of present illness.     All other systems reviewed and are negative.  Prior CV studies:   The following studies were reviewed today:  CCTA 04/16/21:  1. Left Main: No significant stenosis. LM FFR = 0.99.   2. LAD: Possible stenosis in mid LAD. Proximal FFR = 0.85, Mid FFR = 0.78, Distal FFR = 0.66. Possible stenosis in mid Diagonal. Proximal FFR = 0.85, Distal FFR = 0.74.   3. LCX: Possible stenosis of mid LCx. Proximal FFR = 0.93, Mid FFR = 0.57, Distal FFR = 0.56.   4. Ramus: No significant stenosis. Proximal FFR = 0.96. Vessel not modeled after the proximal portion.   5. RCA: No significant stenosis. Proximal FFR = 0.99, Mid FFR = 0.96, Distal FFR = 0.87.   IMPRESSION: 1. Coronary CT FFR demonstrates possible flow limiting lesions in the mid LAD, mid LCX and mid diagonal vessels.   2.  Recommend cardiac catheterization.   Echocardiogram 07/22/2017:  - Left ventricle: The cavity size was normal. Systolic function was    normal. The estimated ejection fraction was in the range of 55%    to 60%. Wall motion was normal; there were no regional wall    motion abnormalities. Features are consistent with a pseudonormal    left ventricular filling pattern, with concomitant abnormal    relaxation and increased filling pressure (grade 2 diastolic    dysfunction).  - Aortic valve: Valve area (VTI): 2.93 cm^2. Valve area (Vmax):    2.78 cm^2. Valve area (Vmean): 2.58 cm^2.  - Right atrium: The atrium was mildly dilated.  - Pulmonary arteries: Systolic pressure was mildly increased. PA    peak pressure: 38 mm Hg (S).    Chest CT 06/20/2020:   FINDINGS: Cardiovascular: Heart size is normal. Small amount of pericardial fluid and/or thickening anteriorly, similar to the prior study, and unlikely to be of hemodynamic significance at  this time. No associated pericardial calcification. There is aortic atherosclerosis, as well as atherosclerosis of the great vessels of the mediastinum and the coronary arteries, including calcified atherosclerotic plaque in the left main, left anterior descending, left circumflex and right  coronary arteries. 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  Labs/Other Tests and Data Reviewed:    EKG:  An ECG dated 03/12/21 was personally reviewed today and demonstrated:  NSR with HR 67bpm, QTc 447ms  Recent Labs: 06/20/2020: Hemoglobin 14.0; Platelet Count 229 12/08/2020: ALT 12 03/12/2021: BUN 20; Creatinine, Ser 0.82; Potassium 4.3; Sodium 142   Recent Lipid Panel Lab Results  Component Value Date/Time   CHOL 146 04/11/2021 02:21 PM   TRIG 119 04/11/2021 02:21 PM   HDL 63 04/11/2021 02:21 PM   CHOLHDL 2.3 04/11/2021 02:21 PM   LDLCALC 62 04/11/2021 02:21 PM   LDLDIRECT 142.2 12/26/2008 10:32 AM    Wt Readings from Last 3 Encounters:  04/18/21 164 lb (74.4 kg)  04/11/21 166 lb 6.4 oz (75.5 kg)  03/12/21 165 lb (74.8 kg)    Risk Assessment/Calculations:    CHA2DS2-VASc Score = 3  This indicates a 3.2% annual risk of stroke. The patient's score is based upon: CHF History: No HTN History: No Diabetes History: No Stroke History: No Vascular Disease History: Yes Age Score: 2 Gender Score: 0    Objective:    Vital Signs:  Wt 164 lb (74.4 kg)   BMI 24.22 kg/m    VITAL SIGNS:  reviewed GEN:  no acute distress NEURO:  alert and oriented x 3, no obvious focal deficit  ASSESSMENT & PLAN:    1.  Chest pain with abnormal CCTA: -Seen 03/12/21 with an episode of chest pain with previous chest CT concerning with calcifications in the left main, LAD, LCx and RCA. -CCTA performed 04/16/21 that showed evidence of CAD in the mLAD, mLCX and mid Diag vessels with recommendations to pursue LHC -Occasion chest pain "twinges" since last evaluation>>not used SL NTG -Cardiac  catheterization was discussed with the patient fully. The patient understands that risks include but are not limited to stroke (1 in 1000), death (1 in 66), kidney failure [usually temporary] (1 in 500), bleeding (1 in 200), allergic reaction [possibly serious] (1 in 200).  The patient understands and is willing to proceed.    Given recent onset of chest pain, plan is to proceed with CCTA for further evaluation. If significant lesions on imaging, plan to proceed with LHC. -Continue statin -No ASA in the setting of Xarelto>>however will need to hold for LHC -Has a contrast dye allergy therefore will pre-medicate with Prednisone Q13, Q7 and Q1H before cath  -Will also prescribe benadryl 25mg  1H prior to procedure   -Creatinine, 0.82 on 03/12/21   2.  PAF: -Maintaining NSR on last OV evaluation  -Continue with diltiazem 120 mg daily -Anticoagulated with Xarelto 20 mg daily -Needs to hold prior to cath    3.  COPD with lung CA: -Continues to smoke intermittently -Follows with oncology  -On multiple inhalers -No c/o SOB    4.  Ectasia of ascending thoracic aorta: -Measuring 4.0 cm in diameter -Will plan to follow closely -Maintain adequate BP   5.  HLD: -LDL, 62 on 04/11/21 -Continue pravastatin   6. Tobacco Use: -Continues to intermittently smoke -Cessation encouraged   Shared Decision Making/Informed Consent The risks [stroke (1 in 1000), death (1 in 1000), kidney failure [usually temporary] (1 in 500), bleeding (1 in 200), allergic reaction [possibly serious] (1 in 200)], benefits (diagnostic support and management of coronary artery disease) and alternatives of a cardiac catheterization were discussed in detail with Mr. Favor and he is willing to proceed.   COVID-19 Education: The signs and  symptoms of COVID-19 were discussed with the patient and how to seek care for testing (follow up with PCP or arrange E-visit). The importance of social distancing was discussed today.  Time:    Today, I have spent 15 minutes with the patient with telehealth technology discussing the above problems.     Medication Adjustments/Labs and Tests Ordered: Current medicines are reviewed at length with the patient today.  Concerns regarding medicines are outlined above.   Tests Ordered: No orders of the defined types were placed in this encounter.   Medication Changes: No orders of the defined types were placed in this encounter.   Follow Up:  In Person in 4 week(s)  Signed, Kathyrn Drown, NP  04/18/2021 1:16 PM    [facility]

## 2021-04-18 NOTE — Telephone Encounter (Signed)
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 Said he didn't think about it when I went over the instructions with him that he had already taken it today, so he called back to let us know. Advised we would check with the procedure navigator to make sure it is okay to continue as planned for cath on Friday. IF he doesn't hear otherwise to continue with current plan. Patient verbalized understanding.

## 2021-04-18 NOTE — Telephone Encounter (Signed)
Per Kathyrn Drown, reviewed CT results with patient and scheduled him for MyChart visit today at 1330 to discuss heart catheterization.  Sharee Pimple will discuss with the patient and send message to Triage to arrange cath once virtual visit is complete. The patient will need to arrive 2.5 hours prior to cath to update blood work and EKG.  Consent for e-visit obtained. Meds and allergies reviewed.     Patient Consent for Virtual Visit        Robert Bates has provided verbal consent on 04/18/2021 for a virtual visit (video or telephone).   [name]  By participating in this virtual visit I agree to the following:  I hereby voluntarily request, consent and authorize [facility] and its employed or contracted physicians, physician assistants, nurse practitioners or other licensed health care professionals (the Practitioner), to provide me with telemedicine health care services (the "Services") as deemed necessary by the treating Practitioner. I acknowledge and consent to receive the Services by the Practitioner via telemedicine. I understand that the telemedicine visit will involve communicating with the Practitioner through live audiovisual communication technology and the disclosure of certain medical information by electronic transmission. I acknowledge that I have been given the opportunity to request an in-person assessment or other available alternative prior to the telemedicine visit and am voluntarily participating in the telemedicine visit.  I understand that I have the right to withhold or withdraw my consent to the use of telemedicine in the course of my care at any time, without affecting my right to future care or treatment, and that the Practitioner or I may terminate the telemedicine visit at any time. I understand that I have the right to inspect all information obtained and/or recorded in the course of the telemedicine visit and may receive copies of  available information for a reasonable fee.  I understand that some of the potential risks of receiving the Services via telemedicine include:  Delay or interruption in medical evaluation due to technological equipment failure or disruption; Information transmitted may not be sufficient (e.g. poor resolution of images) to allow for appropriate medical decision making by the Practitioner; and/or  In rare instances, security protocols could fail, causing a breach of personal health information.  Furthermore, I acknowledge that it is my responsibility to provide information about my medical history, conditions and care that is complete and accurate to the best of my ability. I acknowledge that Practitioner's advice, recommendations, and/or decision may be based on factors not within their control, such as incomplete or inaccurate data provided by me or distortions of diagnostic images or specimens that may result from electronic transmissions. I understand that the practice of medicine is not an exact science and that Practitioner makes no warranties or guarantees regarding treatment outcomes. I acknowledge that a copy of this consent can be made available to me via my patient portal ([facility]), or I can request a printed copy by calling the office of [facility].    I understand that my insurance will be billed for this visit.   I have read or had this consent read to me. I understand the contents of this consent, which adequately explains the benefits and risks of the Services being provided via telemedicine.  I have been provided ample opportunity to ask questions regarding this consent and the Services and have had my questions answered to my satisfaction. I give my informed consent for the services to be provided through the use  of telemedicine in my medical care

## 2021-04-18 NOTE — Telephone Encounter (Signed)
Called patient and his wife and went over instructions for heart cath on Friday. They viewed on mychart message while on phone. Sent in contrast allergy medication and patient has benadryl and aspirin at home.  Patient and wife verbalized understanding.

## 2021-04-18 NOTE — Telephone Encounter (Signed)
Patient called to say that he took a bloodthiner pill not realizing he wasn't supposed to take any of the pill. Patient called in to see if it he will be okay. He was supposed only take the them for 3 days. Please advise

## 2021-04-19 ENCOUNTER — Other Ambulatory Visit (HOSPITAL_BASED_OUTPATIENT_CLINIC_OR_DEPARTMENT_OTHER): Payer: Self-pay

## 2021-04-19 ENCOUNTER — Telehealth: Payer: Self-pay | Admitting: *Deleted

## 2021-04-19 NOTE — Telephone Encounter (Signed)
I spoke with patient and he confirmed he took his last dose of Xarelto 04/18/21 around 11:30 AM -12 Noon on Wednesday June 15.  Patient reports he has not taken any Xarelto since 11:30 AM-12 Noon yesterday and is aware to continue to hold Xarelto until after the cath scheduled for 04/20/21 10:30 AM (will be almost 48 hours since last dose).

## 2021-04-19 NOTE — Telephone Encounter (Addendum)
Pt contacted pre-catheterization scheduled at [facility] for: Friday April 20, 2021 10:30 AM Verified arrival time and place: [facility]) at: 7:30 AM-needs EKG/BMP/CBC   No solid food after midnight prior to cath, clear liquids until 5 AM day of procedure.  CONTRAST ALLERGY: yes 13 hour Prednisone and Benadryl Prep reviewed with patient: 04/19/21 Prednisone 50 mg 9:30 PM 04/20/21 Prednisone 50 mg 3:30 AM 04/20/21 Prednisone 50 mg and Benadryl 50 mg-will take to hospital and take at hospital at 8:30 AM  Hold: Xarelto-last dose 12 Noon 04/18/21 until post procedure  Except hold medications AM meds can be  taken pre-cath with sips of water including: ASA 81 mg   Confirmed patient has responsible adult to drive home post procedure and be with patient first 24 hours after arriving home: yes  You are allowed ONE visitor in the waiting room during the time you are at the hospital for your procedure. Both you and your visitor must wear a mask once you enter the hospital.   Patient reports does not currently have any symptoms concerning for COVID-19 and no household members with COVID-19 like illness.       Reviewed procedure/mask/visitor instructions with patient.

## 2021-04-20 ENCOUNTER — Inpatient Hospital Stay (HOSPITAL_COMMUNITY)
Admission: AD | Admit: 2021-04-20 | Discharge: 2021-04-26 | DRG: 286 | Disposition: A | Discharge status: Home / Self Care | Payer: Medicare Other | Attending: Cardiovascular Disease | Admitting: Cardiovascular Disease

## 2021-04-20 ENCOUNTER — Other Ambulatory Visit: Payer: Self-pay | Admitting: *Deleted

## 2021-04-20 ENCOUNTER — Inpatient Hospital Stay (HOSPITAL_COMMUNITY)
Admission: AD | Disposition: A | Discharge status: Home / Self Care | Payer: Self-pay | Source: Home / Self Care | Attending: Cardiovascular Disease

## 2021-04-20 ENCOUNTER — Encounter (HOSPITAL_COMMUNITY): Payer: Self-pay | Admitting: Cardiovascular Disease

## 2021-04-20 ENCOUNTER — Other Ambulatory Visit: Payer: Self-pay

## 2021-04-20 ENCOUNTER — Inpatient Hospital Stay (HOSPITAL_COMMUNITY): Payer: Medicare Other

## 2021-04-20 DIAGNOSIS — I200 Unstable angina: Secondary | ICD-10-CM | POA: Diagnosis present

## 2021-04-20 DIAGNOSIS — I4891 Unspecified atrial fibrillation: Secondary | ICD-10-CM | POA: Diagnosis present

## 2021-04-20 DIAGNOSIS — I5032 Chronic diastolic (congestive) heart failure: Secondary | ICD-10-CM | POA: Diagnosis present

## 2021-04-20 DIAGNOSIS — Z87442 Personal history of urinary calculi: Secondary | ICD-10-CM

## 2021-04-20 DIAGNOSIS — E119 Type 2 diabetes mellitus without complications: Secondary | ICD-10-CM | POA: Diagnosis not present

## 2021-04-20 DIAGNOSIS — E876 Hypokalemia: Secondary | ICD-10-CM | POA: Diagnosis not present

## 2021-04-20 DIAGNOSIS — Z825 Family history of asthma and other chronic lower respiratory diseases: Secondary | ICD-10-CM

## 2021-04-20 DIAGNOSIS — I480 Paroxysmal atrial fibrillation: Secondary | ICD-10-CM | POA: Diagnosis present

## 2021-04-20 DIAGNOSIS — E1151 Type 2 diabetes mellitus with diabetic peripheral angiopathy without gangrene: Secondary | ICD-10-CM | POA: Diagnosis present

## 2021-04-20 DIAGNOSIS — Z85118 Personal history of other malignant neoplasm of bronchus and lung: Secondary | ICD-10-CM

## 2021-04-20 DIAGNOSIS — J449 Chronic obstructive pulmonary disease, unspecified: Secondary | ICD-10-CM | POA: Diagnosis not present

## 2021-04-20 DIAGNOSIS — Z7951 Long term (current) use of inhaled steroids: Secondary | ICD-10-CM

## 2021-04-20 DIAGNOSIS — I110 Hypertensive heart disease with heart failure: Secondary | ICD-10-CM | POA: Diagnosis not present

## 2021-04-20 DIAGNOSIS — I700 Atherosclerosis of aorta: Secondary | ICD-10-CM | POA: Diagnosis present

## 2021-04-20 DIAGNOSIS — Z9049 Acquired absence of other specified parts of digestive tract: Secondary | ICD-10-CM

## 2021-04-20 DIAGNOSIS — I2584 Coronary atherosclerosis due to calcified coronary lesion: Secondary | ICD-10-CM | POA: Diagnosis not present

## 2021-04-20 DIAGNOSIS — U071 COVID-19: Secondary | ICD-10-CM

## 2021-04-20 DIAGNOSIS — Z9841 Cataract extraction status, right eye: Secondary | ICD-10-CM | POA: Diagnosis not present

## 2021-04-20 DIAGNOSIS — F17290 Nicotine dependence, other tobacco product, uncomplicated: Secondary | ICD-10-CM | POA: Diagnosis present

## 2021-04-20 DIAGNOSIS — I10 Essential (primary) hypertension: Secondary | ICD-10-CM | POA: Diagnosis present

## 2021-04-20 DIAGNOSIS — I472 Ventricular tachycardia: Secondary | ICD-10-CM | POA: Diagnosis not present

## 2021-04-20 DIAGNOSIS — Z923 Personal history of irradiation: Secondary | ICD-10-CM

## 2021-04-20 DIAGNOSIS — Z9842 Cataract extraction status, left eye: Secondary | ICD-10-CM | POA: Diagnosis not present

## 2021-04-20 DIAGNOSIS — Z7901 Long term (current) use of anticoagulants: Secondary | ICD-10-CM

## 2021-04-20 DIAGNOSIS — K219 Gastro-esophageal reflux disease without esophagitis: Secondary | ICD-10-CM | POA: Diagnosis not present

## 2021-04-20 DIAGNOSIS — I4892 Unspecified atrial flutter: Secondary | ICD-10-CM | POA: Diagnosis not present

## 2021-04-20 DIAGNOSIS — I77810 Thoracic aortic ectasia: Secondary | ICD-10-CM | POA: Diagnosis not present

## 2021-04-20 DIAGNOSIS — I2510 Atherosclerotic heart disease of native coronary artery without angina pectoris: Secondary | ICD-10-CM | POA: Diagnosis not present

## 2021-04-20 DIAGNOSIS — K449 Diaphragmatic hernia without obstruction or gangrene: Secondary | ICD-10-CM | POA: Diagnosis not present

## 2021-04-20 DIAGNOSIS — Z79899 Other long term (current) drug therapy: Secondary | ICD-10-CM

## 2021-04-20 DIAGNOSIS — Z806 Family history of leukemia: Secondary | ICD-10-CM

## 2021-04-20 DIAGNOSIS — Z961 Presence of intraocular lens: Secondary | ICD-10-CM | POA: Diagnosis present

## 2021-04-20 DIAGNOSIS — Z823 Family history of stroke: Secondary | ICD-10-CM

## 2021-04-20 DIAGNOSIS — N400 Enlarged prostate without lower urinary tract symptoms: Secondary | ICD-10-CM | POA: Diagnosis present

## 2021-04-20 DIAGNOSIS — I25110 Atherosclerotic heart disease of native coronary artery with unstable angina pectoris: Secondary | ICD-10-CM | POA: Diagnosis not present

## 2021-04-20 DIAGNOSIS — Z888 Allergy status to other drugs, medicaments and biological substances status: Secondary | ICD-10-CM

## 2021-04-20 DIAGNOSIS — D329 Benign neoplasm of meninges, unspecified: Secondary | ICD-10-CM | POA: Diagnosis not present

## 2021-04-20 DIAGNOSIS — E785 Hyperlipidemia, unspecified: Secondary | ICD-10-CM | POA: Diagnosis present

## 2021-04-20 DIAGNOSIS — Z006 Encounter for examination for normal comparison and control in clinical research program: Secondary | ICD-10-CM

## 2021-04-20 DIAGNOSIS — J439 Emphysema, unspecified: Secondary | ICD-10-CM | POA: Diagnosis not present

## 2021-04-20 DIAGNOSIS — R001 Bradycardia, unspecified: Secondary | ICD-10-CM | POA: Diagnosis not present

## 2021-04-20 DIAGNOSIS — Z01810 Encounter for preprocedural cardiovascular examination: Secondary | ICD-10-CM | POA: Diagnosis not present

## 2021-04-20 DIAGNOSIS — I4819 Other persistent atrial fibrillation: Secondary | ICD-10-CM | POA: Diagnosis present

## 2021-04-20 DIAGNOSIS — Z91041 Radiographic dye allergy status: Secondary | ICD-10-CM

## 2021-04-20 DIAGNOSIS — F17200 Nicotine dependence, unspecified, uncomplicated: Secondary | ICD-10-CM | POA: Diagnosis not present

## 2021-04-20 HISTORY — PX: LEFT HEART CATH AND CORONARY ANGIOGRAPHY: CATH118249

## 2021-04-20 LAB — BASIC METABOLIC PANEL
Anion gap: 9 (ref 5–15)
BUN: 13 mg/dL (ref 8–23)
CO2: 25 mmol/L (ref 22–32)
Calcium: 8.7 mg/dL — ABNORMAL LOW (ref 8.9–10.3)
Chloride: 105 mmol/L (ref 98–111)
Creatinine, Ser: 0.75 mg/dL (ref 0.61–1.24)
GFR, Estimated: >60 mL/min (ref >60)
Glucose, Bld: 181 mg/dL — ABNORMAL HIGH (ref 70–99)
Potassium: 3.9 mmol/L (ref 3.5–5.1)
Sodium: 139 mmol/L (ref 135–145)

## 2021-04-20 LAB — CBC
HCT: 43.4 % (ref 39.0–52.0)
Hemoglobin: 13.9 g/dL (ref 13.0–17.0)
MCH: 30.2 pg (ref 26.0–34.0)
MCHC: 32 g/dL (ref 30.0–36.0)
MCV: 94.3 fL (ref 80.0–100.0)
Platelets: 192 10*3/uL (ref 150–400)
RBC: 4.6 MIL/uL (ref 4.22–5.81)
RDW: 14.3 % (ref 11.5–15.5)
WBC: 4.9 10*3/uL (ref 4.0–10.5)
nRBC: 0 % (ref 0.0–0.2)

## 2021-04-20 LAB — GLUCOSE, CAPILLARY: Glucose-Capillary: 170 mg/dL — ABNORMAL HIGH (ref 70–99)

## 2021-04-20 SURGERY — LEFT HEART CATH AND CORONARY ANGIOGRAPHY
Anesthesia: LOCAL

## 2021-04-20 MED ORDER — SODIUM CHLORIDE 0.9 % IV SOLN: INTRAVENOUS | Status: AC

## 2021-04-20 MED ORDER — MOMETASONE FURO-FORMOTEROL FUM 200-5 MCG/ACT IN AERO
2.0000 | INHALATION_SPRAY | Freq: Two times a day (BID) | RESPIRATORY_TRACT | Status: DC
Start: 2021-04-20 — End: 2021-04-26
  Administered 2021-04-20 – 2021-04-26 (×11): 2 via RESPIRATORY_TRACT
  Filled 2021-04-20: qty 8.8

## 2021-04-20 MED ORDER — VERAPAMIL HCL 2.5 MG/ML IV SOLN
INTRAVENOUS | Status: AC
  Filled 2021-04-20: qty 2

## 2021-04-20 MED ORDER — ADULT MULTIVITAMIN W/MINERALS CH
1.0000 | ORAL_TABLET | Freq: Every day | ORAL | Status: DC
  Administered 2021-04-20 – 2021-04-26 (×7): 1 via ORAL
  Filled 2021-04-20 (×7): qty 1

## 2021-04-20 MED ORDER — ATORVASTATIN CALCIUM 80 MG PO TABS
80.0000 mg | ORAL_TABLET | Freq: Every day | ORAL | Status: DC
  Administered 2021-04-20 – 2021-04-21 (×2): 80 mg via ORAL
  Filled 2021-04-20 (×2): qty 1

## 2021-04-20 MED ORDER — METOPROLOL TARTRATE 12.5 MG HALF TABLET
12.5000 mg | ORAL_TABLET | Freq: Two times a day (BID) | ORAL | Status: DC
  Administered 2021-04-20 – 2021-04-21 (×3): 12.5 mg via ORAL
  Filled 2021-04-20 (×3): qty 1

## 2021-04-20 MED ORDER — SODIUM CHLORIDE 0.9 % IV SOLN: 250.0000 mL | INTRAVENOUS | Status: DC | PRN

## 2021-04-20 MED ORDER — ASPIRIN 81 MG PO CHEW: 81.0000 mg | CHEWABLE_TABLET | ORAL | Status: DC

## 2021-04-20 MED ORDER — HEPARIN SODIUM (PORCINE) 1000 UNIT/ML IJ SOLN
INTRAMUSCULAR | Status: AC
  Filled 2021-04-20: qty 1

## 2021-04-20 MED ORDER — METOPROLOL TARTRATE 25 MG PO TABS
25.0000 mg | ORAL_TABLET | Freq: Two times a day (BID) | ORAL | Status: DC
  Administered 2021-04-20: 25 mg via ORAL
  Filled 2021-04-20 (×2): qty 1

## 2021-04-20 MED ORDER — MIDAZOLAM HCL 2 MG/2ML IJ SOLN
INTRAMUSCULAR | Status: AC
  Filled 2021-04-20: qty 2

## 2021-04-20 MED ORDER — VERAPAMIL HCL 2.5 MG/ML IV SOLN
INTRAVENOUS | Status: DC | PRN
  Administered 2021-04-20: 10 mL via INTRA_ARTERIAL

## 2021-04-20 MED ORDER — SODIUM CHLORIDE 0.9% FLUSH
3.0000 mL | Freq: Two times a day (BID) | INTRAVENOUS | Status: DC
  Administered 2021-04-20 – 2021-04-26 (×9): 3 mL via INTRAVENOUS

## 2021-04-20 MED ORDER — LIDOCAINE HCL (PF) 1 % IJ SOLN
INTRAMUSCULAR | Status: DC | PRN
  Administered 2021-04-20: 2 mL

## 2021-04-20 MED ORDER — METOPROLOL TARTRATE 5 MG/5ML IV SOLN
INTRAVENOUS | Status: DC | PRN
  Administered 2021-04-20: 5 mg via INTRAVENOUS

## 2021-04-20 MED ORDER — HEPARIN (PORCINE) 25000 UT/250ML-% IV SOLN
1200.0000 [IU]/h | INTRAVENOUS | Status: DC
  Administered 2021-04-20: 1000 [IU]/h via INTRAVENOUS
  Administered 2021-04-21 – 2021-04-24 (×3): 1100 [IU]/h via INTRAVENOUS
  Administered 2021-04-25: 1200 [IU]/h via INTRAVENOUS
  Filled 2021-04-20 (×6): qty 250

## 2021-04-20 MED ORDER — ONDANSETRON HCL 4 MG/2ML IJ SOLN
4.0000 mg | Freq: Four times a day (QID) | INTRAMUSCULAR | Status: DC | PRN
  Administered 2021-04-22: 4 mg via INTRAVENOUS
  Filled 2021-04-20: qty 2

## 2021-04-20 MED ORDER — HEPARIN (PORCINE) IN NACL 1000-0.9 UT/500ML-% IV SOLN
INTRAVENOUS | Status: DC | PRN
  Administered 2021-04-20: 500 mL

## 2021-04-20 MED ORDER — ALUM & MAG HYDROXIDE-SIMETH 200-200-20 MG/5ML PO SUSP: 15.0000 mL | ORAL | Status: DC | PRN

## 2021-04-20 MED ORDER — MUPIROCIN 2 % EX OINT: 1.0000 "application " | TOPICAL_OINTMENT | Freq: Two times a day (BID) | CUTANEOUS | Status: DC

## 2021-04-20 MED ORDER — IOHEXOL 350 MG/ML SOLN
INTRAVENOUS | Status: DC | PRN
  Administered 2021-04-20: 30 mL

## 2021-04-20 MED ORDER — ASPIRIN EC 81 MG PO TBEC: 81.0000 mg | DELAYED_RELEASE_TABLET | Freq: Once | ORAL | Status: DC

## 2021-04-20 MED ORDER — ACETAMINOPHEN 325 MG PO TABS: 650.0000 mg | ORAL_TABLET | ORAL | Status: DC | PRN

## 2021-04-20 MED ORDER — DIPHENHYDRAMINE HCL 50 MG/ML IJ SOLN
25.0000 mg | Freq: Once | INTRAMUSCULAR | Status: AC
  Administered 2021-04-20: 25 mg via INTRAVENOUS
  Filled 2021-04-20: qty 1

## 2021-04-20 MED ORDER — TAMSULOSIN HCL 0.4 MG PO CAPS
0.4000 mg | ORAL_CAPSULE | Freq: Every day | ORAL | Status: DC
  Administered 2021-04-20 – 2021-04-23 (×4): 0.4 mg via ORAL
  Filled 2021-04-20 (×4): qty 1

## 2021-04-20 MED ORDER — SODIUM CHLORIDE 0.9 % WEIGHT BASED INFUSION: 1.0000 mL/kg/h | INTRAVENOUS | Status: DC

## 2021-04-20 MED ORDER — LIDOCAINE HCL (PF) 1 % IJ SOLN
INTRAMUSCULAR | Status: AC
  Filled 2021-04-20: qty 30

## 2021-04-20 MED ORDER — DILTIAZEM HCL ER COATED BEADS 120 MG PO CP24
120.0000 mg | ORAL_CAPSULE | Freq: Every day | ORAL | Status: DC
  Administered 2021-04-20: 120 mg via ORAL
  Filled 2021-04-20: qty 1

## 2021-04-20 MED ORDER — ALBUTEROL SULFATE (2.5 MG/3ML) 0.083% IN NEBU: 2.5000 mg | INHALATION_SOLUTION | Freq: Four times a day (QID) | RESPIRATORY_TRACT | Status: DC | PRN

## 2021-04-20 MED ORDER — FENTANYL CITRATE (PF) 100 MCG/2ML IJ SOLN
INTRAMUSCULAR | Status: AC
  Filled 2021-04-20: qty 2

## 2021-04-20 MED ORDER — SODIUM CHLORIDE 0.9% FLUSH: 3.0000 mL | INTRAVENOUS | Status: DC | PRN

## 2021-04-20 MED ORDER — SODIUM CHLORIDE 0.9 % WEIGHT BASED INFUSION
3.0000 mL/kg/h | INTRAVENOUS | Status: DC
  Administered 2021-04-20: 3 mL/kg/h via INTRAVENOUS

## 2021-04-20 MED ORDER — PANTOPRAZOLE SODIUM 40 MG PO TBEC
40.0000 mg | DELAYED_RELEASE_TABLET | Freq: Every day | ORAL | Status: DC
  Administered 2021-04-20 – 2021-04-26 (×7): 40 mg via ORAL
  Filled 2021-04-20 (×7): qty 1

## 2021-04-20 MED ORDER — SODIUM CHLORIDE 0.9% FLUSH
3.0000 mL | Freq: Two times a day (BID) | INTRAVENOUS | Status: DC
  Administered 2021-04-20 – 2021-04-26 (×8): 3 mL via INTRAVENOUS

## 2021-04-20 MED ORDER — ALBUTEROL SULFATE HFA 108 (90 BASE) MCG/ACT IN AERS: 2.0000 | INHALATION_SPRAY | Freq: Four times a day (QID) | RESPIRATORY_TRACT | Status: DC | PRN

## 2021-04-20 MED ORDER — MIDAZOLAM HCL 2 MG/2ML IJ SOLN
INTRAMUSCULAR | Status: DC | PRN
  Administered 2021-04-20: 1 mg via INTRAVENOUS

## 2021-04-20 MED ORDER — SODIUM CHLORIDE 0.9 % WEIGHT BASED INFUSION: 3.0000 mL/kg/h | INTRAVENOUS | Status: DC

## 2021-04-20 MED ORDER — NITROGLYCERIN 0.4 MG SL SUBL
0.4000 mg | SUBLINGUAL_TABLET | SUBLINGUAL | Status: DC | PRN
  Administered 2021-04-20 (×2): 0.4 mg via SUBLINGUAL
  Filled 2021-04-20: qty 1

## 2021-04-20 MED ORDER — FENTANYL CITRATE (PF) 100 MCG/2ML IJ SOLN
INTRAMUSCULAR | Status: DC | PRN
  Administered 2021-04-20: 25 ug via INTRAVENOUS

## 2021-04-20 MED ORDER — METOPROLOL TARTRATE 5 MG/5ML IV SOLN
INTRAVENOUS | Status: AC
  Filled 2021-04-20: qty 5

## 2021-04-20 MED ORDER — HEPARIN (PORCINE) IN NACL 1000-0.9 UT/500ML-% IV SOLN
INTRAVENOUS | Status: AC
  Filled 2021-04-20: qty 1000

## 2021-04-20 SURGICAL SUPPLY — 9 items
CATH INFINITI 5FR JK (CATHETERS) ×2 IMPLANT
DEVICE RAD COMP TR BAND LRG (VASCULAR PRODUCTS) ×2 IMPLANT
GLIDESHEATH SLEND SS 6F .021 (SHEATH) ×2 IMPLANT
GUIDEWIRE INQWIRE 1.5J.035X260 (WIRE) ×1 IMPLANT
INQWIRE 1.5J .035X260CM (WIRE) ×2
KIT HEART LEFT (KITS) ×2 IMPLANT
PACK CARDIAC CATHETERIZATION (CUSTOM PROCEDURE TRAY) ×2 IMPLANT
TRANSDUCER W/STOPCOCK (MISCELLANEOUS) ×2 IMPLANT
TUBING CIL FLEX 10 FLL-RA (TUBING) ×2 IMPLANT

## 2021-04-20 NOTE — Research (Signed)
IDENTIFY Informed Consent                  Subject Name: Robert Bates   Subject met inclusion and exclusion criteria.  The informed consent form, study requirements and expectations were reviewed with the subject and questions and concerns were addressed prior to the signing of the consent form.  The subject verbalized understanding of the trial requirements.  The subject agreed to participate in the IDENTIFY trial and signed the informed consent at 08:28AM on 04/20/21.  The informed consent was obtained prior to performance of any protocol-specific procedures for the subject.  A copy of the signed informed consent was given to the subject and a copy was placed in the subject's medical record.   Meade Maw , Naval architect

## 2021-04-20 NOTE — Interval H&P Note (Signed)
Cath Lab Visit (complete for each Cath Lab visit)  Clinical Evaluation Leading to the Procedure:   ACS: No.  Non-ACS:    Anginal Classification: CCS II  Anti-ischemic medical therapy: Minimal Therapy (1 class of medications)  Non-Invasive Test Results: High-risk stress test findings: cardiac mortality >3%/year  Prior CABG: No previous CABG      History and Physical Interval Note:  04/20/2021 11:10 AM  Robert Bates  has presented today for surgery, with the diagnosis of abnormal cardiac ct.  The various methods of treatment have been discussed with the patient and family. After consideration of risks, benefits and other options for treatment, the patient has consented to  Procedure(s): LEFT HEART CATH AND CORONARY ANGIOGRAPHY (N/A) as a surgical intervention.  The patient's history has been reviewed, patient examined, no change in status, stable for surgery.  I have reviewed the patient's chart and labs.  Questions were answered to the patient's satisfaction.     Kathlyn [place]

## 2021-04-20 NOTE — Progress Notes (Signed)
ANTICOAGULATION CONSULT NOTE - Initial Consult  Pharmacy Consult for IV heparin Indication:  CAD awaiting CABG consult  Allergies  Allergen Reactions   Iodine Other (See Comments)    neck swells   Iohexol Swelling    Neck and gland swelling per patient.    Patient Measurements: Height: 5\' 9"  (175.3 cm) Weight: 72 kg (158 lb 11.7 oz) IBW/kg (Calculated) : 70.7 Heparin Dosing Weight: 72 kg  Vital Signs: Temp: 97.6 F (36.4 C) (06/17 1312) Temp Source: Oral (06/17 1312) BP: 130/82 (06/17 1312) Pulse Rate: 66 (06/17 1312)  Labs: Recent Labs    04/20/21 0747  HGB 13.9  HCT 43.4  PLT 192  CREATININE 0.75    Estimated Creatinine Clearance: 74.9 mL/min (by C-G formula based on SCr of 0.75 mg/dL).   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   COPD (chronic obstructive pulmonary disease) ([place])    Diabetes type 2, controlled ([place]) 07/31/2017   Dysrhythmia    a fib   GERD (gastroesophageal reflux disease)    Hematuria    refuses work up or referral - understands risks of morbidity / mortality - 11/2008, 12/2008   History of hiatal hernia    History of kidney stones    Hyperlipemia    Meningioma ([place]) 10/25/2013   Follows with Dr. [name].    Peripheral vascular disease ([place])    Abdominal Aortic Aneursym   Pneumonia    as a child   Radiation 09/18/15-10/25/15   left lower lobe 70.2 Gy   Seizures ([place]) 02/18/2020   Tobacco abuse     Medications:  Infusions:   sodium chloride 75 mL/hr at 04/20/21 1158   sodium chloride     heparin      Assessment: 80 yo male admitted after scheduled LHC for CABG eval.  On chronic Xarelto at home, last took dose 6/15 at noon.  Pharmacy asked to begin anticoagulation with IV heparin 6 hrs after sheath pulled (removed 1130 AM).  Goal of Therapy:  Heparin level  0.3-0.7 units/ml Monitor platelets by anticoagulation protocol: Yes   Plan:  Start IV heparin at 1000 units/hr at 1730 PM with no bolus. Check aPTT/heparin level 8 hrs after heparin begins. Daily aPTT, heparin level and CBC. F/u plans for CABG.  [place] Crane, Roylene Reason, BCCP Clinical Pharmacist  04/20/2021 1:34 PM   [facility] pharmacy phone numbers are listed on [email or web address]

## 2021-04-20 NOTE — Consult Note (Signed)
[address]       [place] [address]             [phone]        Zyheir Pratte Porter Medical Record #[id number] Date of Birth: 02/23/1941  Referring: Fletcher Anon Primary Care: [name], NP Primary Cardiologist:Philip Nahser, MD  Chief Complaint:  3V CAD  History of Present Illness:      [name] is a 80 yo male with known history of PAF on Xarelto, H/O Lung Cancer 2 separate primaries treated with radiation, PVD, HLD, chronic diastolic CHF, DM2, and nicotine abuse.  He had been in his usual state of health until May of this year.  He presented to his PCP with complaints of headache and atypical chest pain.   He had experienced a head trauma and being he was on Xarelto they recommended a head CT which showed on evidence of bleeding or acute abnormalities.  He has 2 meningiomas which remained unchanged.  He was referred back to his cardiologist for evaluation of atypical chest pain.  He was evaluated on 03/12/2021 by Kathyrn Drown at which time he admitted to developing left sided chest pain with shortness of breath.  The patient tried to massage his chest, but this did not relieve his symptoms.  He described the pain as a pressure with radiation to his arms, jaw, or back.  He had a previous CT of his chest that showed evidence of coronary calcification.  It was felt due to this repeat CCTA should be obtained.  This was performed on 04/16/2021 and showed flow limiting lesions in the LAD, Circumflex, and diagonal vessel.  It was felt he would require a left heart cath for further evaluation.  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  It was felt coronary bypass grafting would be indicated.  The patient currently denies chest pain currently.  The patient quit smoking cigarettes about 1 year ago.  He continues to smoke cigars.   Patient is still working full time as a Administrator.  His last dose of Xarelto was on 6/15.  The patient isn't  very active outside of driving his truck.  He is vaccinated for COVID infection.  He is unsure if he has family history of CAD.  He sometimes has pain in his legs.  He is right handed.    Current Activity/ Functional Status: Patient is independent with mobility/ambulation, transfers, ADL's, IADL's.   Zubrod Score: At the time of surgery this patient's most appropriate activity status/level should be described as: []     0    Normal activity, no symptoms [x]     1    Restricted in physical strenuous activity but ambulatory, able to do out light work []     2    Ambulatory and capable of self care, unable to do work activities, up and about                 more than 50%  Of the time                            []     3    Only limited self care, in bed greater than 50% of waking hours []     4    Completely disabled, no self care, confined to bed or chair []     5    Moribund  Past  Medical History:  Diagnosis Date   Anxiety    Arthritis    Cancer ([place]) 2016   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   COPD (chronic obstructive pulmonary disease) ([place])    Diabetes type 2, controlled ([place]) 07/31/2017   Dysrhythmia    a fib   GERD (gastroesophageal reflux disease)    Hematuria    refuses work up or referral - understands risks of morbidity / mortality - 11/2008, 12/2008   History of hiatal hernia    History of kidney stones    Hyperlipemia    Meningioma ([place]) 10/25/2013   Follows with Dr. [name].    Peripheral vascular disease ([place])    Abdominal Aortic Aneursym   Pneumonia    as a child   Radiation 09/18/15-10/25/15   left lower lobe 70.2 Gy   Seizures ([place]) 02/18/2020   Tobacco abuse     Past Surgical History:  Procedure Laterality Date   CHOLECYSTECTOMY N/A 07/23/2017   Procedure: LAPAROSCOPIC CHOLECYSTECTOMY;  Surgeon: Kinsinger, Arta Bruce, MD;  Location: [facility];  Service: General;  Laterality: N/A;   COLONOSCOPY      EYE SURGERY Bilateral    Cataracts removed w/ lens implant   HERNIA REPAIR     Left 36 years ago . Right inguinal hernia repair 10-01-17 Dr. Kieth Brightly   INGUINAL HERNIA REPAIR Right 10/01/2017   Procedure: RIGHT INGUINAL HERNIA REPAIR WITH MESH;  Surgeon: Kinsinger, Arta Bruce, MD;  Location: [facility];  Service: General;  Laterality: Right;  TAP BLOCK   INSERTION OF MESH Right 10/01/2017   Procedure: INSERTION OF MESH;  Surgeon: Kieth Brightly Arta Bruce, MD;  Location: [facility];  Service: General;  Laterality: Right;   LEFT HEART CATH AND CORONARY ANGIOGRAPHY N/A 04/20/2021   Procedure: LEFT HEART CATH AND CORONARY ANGIOGRAPHY;  Surgeon: Wellington Hampshire, MD;  Location: [facility];  Service: Cardiovascular;  Laterality: N/A;   TONSILLECTOMY     TONSILLECTOMY     VIDEO BRONCHOSCOPY Bilateral 07/26/2015   Procedure: VIDEO BRONCHOSCOPY WITH FLUORO;  Surgeon: [name], MD;  Location: [facility];  Service: Cardiopulmonary;  Laterality: Bilateral;   VIDEO BRONCHOSCOPY WITH ENDOBRONCHIAL NAVIGATION N/A 08/23/2015   Procedure: VIDEO BRONCHOSCOPY WITH ENDOBRONCHIAL NAVIGATION;  Surgeon: Grace Isaac, MD;  Location: [facility];  Service: Thoracic;  Laterality: N/A;   VIDEO BRONCHOSCOPY WITH ENDOBRONCHIAL ULTRASOUND N/A 08/23/2015   Procedure: VIDEO BRONCHOSCOPY WITH ENDOBRONCHIAL ULTRASOUND;  Surgeon: Grace Isaac, MD;  Location: [facility];  Service: Thoracic;  Laterality: N/A;    Social History   Tobacco Use  Smoking Status Former   Packs/day: 1.00   Years: 57.00   Pack years: 57.00   Types: Cigarettes   Quit date: 08/08/2015   Years since quitting: 5.7  Smokeless Tobacco Former   Types: Chew   Quit date: 11/04/1958  Tobacco Comments   occasional use sneaks around    Social History   Substance and Sexual Activity  Alcohol Use Not Currently   Alcohol/week: 0.0 standard drinks     Allergies  Allergen Reactions   Iodine Other (See Comments)    neck swells   Iohexol  Swelling    Neck and gland swelling per patient.    Current Facility-Administered Medications  Medication Dose Route Frequency Provider Last Rate Last Admin   0.9 %  sodium chloride infusion   Intravenous Continuous Wellington Hampshire, MD 75 mL/hr at 04/20/21 1158  Rate Change at 04/20/21 1158   sodium chloride flush (NS) 0.9 % injection 3 mL  3 mL Intravenous Q12H Kathyrn Drown D, NP        Medications Prior to Admission  Medication Sig Dispense Refill Last Dose   aspirin EC 81 MG tablet Take 81 mg by mouth once. Swallow whole.   04/20/2021 at 0630   diltiazem (CARDIZEM CD) 120 MG 24 hr capsule Take 120 mg by mouth daily in the afternoon.   04/18/2021   fluticasone-salmeterol (ADVAIR HFA) 115-21 MCG/ACT inhaler Inhale 2 puffs into the lungs 2 (two) times daily. 12 g 5 04/20/2021   Multiple Vitamin (MULTIVITAMIN WITH MINERALS) TABS tablet Take 1 tablet by mouth daily in the afternoon.   Past Week   nitroGLYCERIN (NITROSTAT) 0.4 MG SL tablet Place 1 tablet (0.4 mg total) under the tongue every 5 (five) minutes as needed for chest pain. 75 tablet 0    omeprazole (PRILOSEC) 40 MG capsule Take 1 capsule (40 mg total) by mouth daily. (Patient taking differently: Take 40 mg by mouth daily in the afternoon.) 30 capsule 5 Past Week   pravastatin (PRAVACHOL) 80 MG tablet TAKE 1 TABLET (80 MG TOTAL) BY MOUTH AT BEDTIME. 90 tablet 1 Past Week   predniSONE (DELTASONE) 50 MG tablet Take first dose (1 tablet) by mouth 13 hours prior to heart cath, then 7 hours prior, then when leaving home for hospital. 3 tablet 0 04/20/2021 at 1250 am, 0430, 0630   rivaroxaban (XARELTO) 20 MG TABS tablet TAKE 1 TABLET BY MOUTH DAILY WITH SUPPER (Patient taking differently: Take 20 mg by mouth every evening.) 30 tablet 5 04/18/2021 at 1200   albuterol (VENTOLIN HFA) 108 (90 Base) MCG/ACT inhaler Inhale 2 puffs into the lungs every 6 (six) hours as needed for wheezing or shortness of breath. 8.5 g 5 Unknown   diphenhydrAMINE  (BENADRYL) 50 MG capsule Take 50 mg by mouth once.   not taken   tamsulosin (FLOMAX) 0.4 MG CAPS capsule TAKE 1 CAPSULE (0.4 MG TOTAL) BY MOUTH DAILY. (Patient taking differently: Take 0.8 mg by mouth daily in the afternoon.) 30 capsule 3 More than a month    Family History  Problem Relation Age of Onset   Leukemia Father    Emphysema Father    Learning disabilities Son    Leukemia Other    Stroke Other      Review of Systems:   ROS    Cardiac Review of Systems: Y or  [    ]= no  Chest Pain [  Y  ]  Resting SOB [  Y ] Exertional SOB  [ Y ]  Orthopnea [  ]   Pedal Edema [   ]    Palpitations [ Y ] Syncope  [  ]   Presyncope [   ]  General Review of Systems: [Y] = yes [  ]=no Constitional: recent weight change [  ]; anorexia [  ]; fatigue [  Y]; nausea [  ]; night sweats [  ]; fever [  ]; or chills [  ]                                                               Dental: Patient has no teeth  Eye :  blurred vision [  ]; diplopia [   ]; vision changes [  ];  Amaurosis fugax[  ]; Left eye cataract removal,  Resp: cough [ Y ];  wheezing[ Y ];  hemoptysis[  ]; shortness of breath[  ]; paroxysmal nocturnal dyspnea[ Y ]; dyspnea on exertion[  ]; or orthopnea[  ]; H/O lung cancer with 2 sep primaries GI:  gallstones[  ], vomiting[ N ];  dysphagia[  ]; melena[  ];  hematochezia [  ]; heartburn[  ];   Hx of  Colonoscopy[  ]; GU: kidney stones [  ]; hematuria[  ];   dysuria [  ];  nocturia[  ];  history of     obstruction [  ]; urinary frequency [  ]             Skin: rash, swelling[  ];, hair loss[  ];  peripheral edema[ Y ];  or itching[  ]; varicosities bilaterally L>R Musculosketetal: myalgias[  ];  joint swelling[  ];  joint erythema[  ];  joint pain[  ];  back pain[  ];  Heme/Lymph: bruising[  ];  bleeding[  ];  anemia[  ];  Neuro: TIA[  ];  headaches[  ];  stroke[ N ];  vertigo[  ];  seizures[  ];   paresthesias[  ];  difficulty walking[Y,occasionally  ];  Psych:depression[ N ]; anxiety[  N ];  Endocrine: diabetes[ N ];  thyroid dysfunction[ N ];       Physical Exam: BP 131/84   Pulse 99   Temp 97.9 F (36.6 C) (Oral)   Resp 15   Ht 5\' 9"  (1.753 m)   Wt 74.4 kg   SpO2 91%   BMI 24.22 kg/m   General appearance: alert, cooperative, and no distress Neck: no adenopathy, no carotid bruit, no JVD, supple, symmetrical, trachea midline, and thyroid not enlarged, symmetric, no tenderness/mass/nodules Resp: wheezes bilaterally Cardio: regular rate and rhythm GI: soft, non-tender; bowel sounds normal; no masses,  no organomegaly Extremities: no edema, extensive varicosities, left leg Neurologic: Grossly normal  Diagnostic Studies & Laboratory data:     Recent Radiology Findings:   CARDIAC CATHETERIZATION  Result Date: 04/20/2021  Mid LM to Dist LM lesion is 70% stenosed.  Ost Cx to Prox Cx lesion is 70% stenosed.  Mid Cx lesion is 90% stenosed.  3rd Mrg lesion is 100% stenosed.  Ost LAD to Prox LAD lesion is 80% stenosed.  1st Diag lesion is 70% stenosed.  Ramus lesion is 60% stenosed.  1.  Significant heavily calcified distal left main disease with significant calcified disease affecting the proximal LAD, large first diagonal, ostial ramus and left circumflex.  Occluded OM 3 with collaterals.  The coronary arteries are codominant with no obstructive disease affecting the right coronary artery. 2.  Normal LV systolic function and left ventricular end-diastolic pressure. Recommendations: The patient was in sinus rhythm on presentation but went into A. fib with RVR during catheterization.  He was given 5 mg of IV metoprolol but was still tachycardic.  Due to this and given his significant coronary artery disease, recommend admission to the hospital. Start heparin drip 6 hours after sheath pull. Recommend rate control for atrial fibrillation.  I started oral metoprolol and if rate is still not controlled, diltiazem drip or amiodarone drip can be considered. I requested an  echocardiogram. I will consult CT surgery for CABG evaluation.      I have independently reviewed the above radiologic studies and discussed with  the patient   Recent Lab Findings: Lab Results  Component Value Date   WBC 4.9 04/20/2021   HGB 13.9 04/20/2021   HCT 43.4 04/20/2021   PLT 192 04/20/2021   GLUCOSE 181 (H) 04/20/2021   CHOL 146 04/11/2021   TRIG 119 04/11/2021   HDL 63 04/11/2021   LDLDIRECT 142.2 12/26/2008   LDLCALC 62 04/11/2021   ALT 12 12/08/2020   AST 13 12/08/2020   NA 139 04/20/2021   K 3.9 04/20/2021   CL 105 04/20/2021   CREATININE 0.75 04/20/2021   BUN 13 04/20/2021   CO2 25 04/20/2021   TSH 1.31 11/08/2019   INR 1.01 09/30/2017   HGBA1C 6.5 04/11/2021    Assessment / Plan:      CAD- 3V CAD, requesting CABG consult Atrial Fibrillation- on Xarelto, held for cath H/O lung cancer- 2 separate primaries, treated with radiation on the left.. due for 1 year scan in August of this year, may be beneficial to repeat this while hear to ensure no recurrence  Nicotine abuse- will need PFTs which have been ordered Dispo- patient is chest pain free currently, will need to complete preoperative testing, need to ensure patient's lung function is appropriate for surgery, may benefit from CT scan to ensure lung cancer hasn't returned,  Dr. Orvan Seen aware will speak with patient further and follow-up with recommendations... of note patient states his gut tells him not to have surgery, unsure if PCI is possible   I  spent 55 minutes counseling the patient face to face.   Ellwood Handler, PA-C 04/20/2021 1:04 PM

## 2021-04-21 ENCOUNTER — Other Ambulatory Visit (HOSPITAL_COMMUNITY): Payer: Medicare Other

## 2021-04-21 DIAGNOSIS — I200 Unstable angina: Secondary | ICD-10-CM

## 2021-04-21 LAB — HEPARIN LEVEL (UNFRACTIONATED)
Heparin Unfractionated: 0.22 IU/mL — ABNORMAL LOW (ref 0.30–0.70)
Heparin Unfractionated: 0.42 IU/mL (ref 0.30–0.70)
Heparin Unfractionated: 0.53 IU/mL (ref 0.30–0.70)

## 2021-04-21 LAB — CBC
HCT: 37.1 % — ABNORMAL LOW (ref 39.0–52.0)
Hemoglobin: 12.1 g/dL — ABNORMAL LOW (ref 13.0–17.0)
MCH: 30.5 pg (ref 26.0–34.0)
MCHC: 32.6 g/dL (ref 30.0–36.0)
MCV: 93.5 fL (ref 80.0–100.0)
Platelets: 162 10*3/uL (ref 150–400)
RBC: 3.97 MIL/uL — ABNORMAL LOW (ref 4.22–5.81)
RDW: 14.2 % (ref 11.5–15.5)
WBC: 10 10*3/uL (ref 4.0–10.5)
nRBC: 0 % (ref 0.0–0.2)

## 2021-04-21 LAB — SARS CORONAVIRUS 2 (TAT 6-24 HRS): SARS Coronavirus 2: POSITIVE — AB

## 2021-04-21 LAB — SURGICAL PCR SCREEN
MRSA, PCR: NEGATIVE
Staphylococcus aureus: NEGATIVE

## 2021-04-21 LAB — APTT: aPTT: 65 seconds — ABNORMAL HIGH (ref 24–36)

## 2021-04-21 MED ORDER — METOPROLOL TARTRATE 12.5 MG HALF TABLET
12.5000 mg | ORAL_TABLET | Freq: Two times a day (BID) | ORAL | Status: DC
  Administered 2021-04-22 – 2021-04-23 (×3): 12.5 mg via ORAL
  Filled 2021-04-21 (×3): qty 1

## 2021-04-21 MED ORDER — NIRMATRELVIR/RITONAVIR (PAXLOVID)TABLET
3.0000 | ORAL_TABLET | Freq: Two times a day (BID) | ORAL | Status: AC
  Administered 2021-04-21 – 2021-04-25 (×10): 3 via ORAL
  Filled 2021-04-21 (×5): qty 30

## 2021-04-21 MED ORDER — METOPROLOL TARTRATE 12.5 MG HALF TABLET: 12.5000 mg | ORAL_TABLET | Freq: Once | ORAL | Status: DC

## 2021-04-21 MED ORDER — METOPROLOL TARTRATE 25 MG PO TABS: 25.0000 mg | ORAL_TABLET | Freq: Two times a day (BID) | ORAL | Status: DC

## 2021-04-21 NOTE — Progress Notes (Signed)
[facility] for IV Heparin Indication:  CAD awaiting CABG consult  Allergies  Allergen Reactions   Iodine Other (See Comments)    neck swells   Iohexol Swelling    Neck and gland swelling per patient.    Patient Measurements: Height: 5\' 9"  (175.3 cm) Weight: 72 kg (158 lb 11.7 oz) IBW/kg (Calculated) : 70.7 kg Heparin Dosing Weight: 72 kg  Vital Signs: Temp: 97.2 F (36.2 C) (06/18 0714) Temp Source: Oral (06/18 0714) BP: 103/69 (06/18 0713) Pulse Rate: 57 (06/18 0714)  Labs: Recent Labs    04/20/21 0747 04/21/21 0200 04/21/21 0225  HGB 13.9  --  12.1*  HCT 43.4  --  37.1*  PLT 192  --  162  APTT  --  65*  --   HEPARINUNFRC  --  0.22*  --   CREATININE 0.75  --   --      Estimated Creatinine Clearance: 74.9 mL/min (by C-G formula based on SCr of 0.75 mg/dL).   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   COPD (chronic obstructive pulmonary disease) ([place])    Diabetes type 2, controlled ([place]) 07/31/2017   Dysrhythmia    a fib   GERD (gastroesophageal reflux disease)    Hematuria    refuses work up or referral - understands risks of morbidity / mortality - 11/2008, 12/2008   History of hiatal hernia    History of kidney stones    Hyperlipemia    Meningioma ([place]) 10/25/2013   Follows with Dr. [name].    Peripheral vascular disease ([place])    Abdominal Aortic Aneursym   Pneumonia    as a child   Radiation 09/18/15-10/25/15   left lower lobe 70.2 Gy   Seizures ([place]) 02/18/2020   Tobacco abuse     Medications:  Infusions:   sodium chloride     heparin 1,100 Units/hr (04/21/21 0449)    Assessment: 80 yo male is s/p cardiac cath on 6/17 which showed 3vCAD with LM involvement. The patient is now being evaluated for CABG. PTA the patient is on  rivaroxaban (last dose 6/15 at noon). Pharmacy is consulted to dose heparin.  Heparin level is therapeutic at 0.42 while running at 1100 units/hr. The heparin levels and aPTT are correlated, therefore, will monitor using heparin levels only. Per the RN, there have been no issues with the infusion and the patient is without signs or symptoms of bleeding. Hgb 12.1, platelets 162.  Goal of Therapy:  Heparin level 0.3-0.7 units/ml Monitor platelets by anticoagulation protocol: Yes   Plan:  -Continue heparin IV at 1100 units/hr -Obtain a confirmatory 8-hr heparin level  -Obtain a daily heparin level and CBC -Follow CABG plans  Shauna Hugh, PharmD, [place]  PGY-1 Pharmacy Resident 04/21/2021 8:16 AM  Please check [email or web address] for unit-specific pharmacy phone numbers.

## 2021-04-21 NOTE — Progress Notes (Signed)
Patient asx with HR 40s without increased dose metop, will go back to 12.5 mg PO bid.

## 2021-04-21 NOTE — Progress Notes (Signed)
[facility] for IV heparin Indication:  CAD awaiting CABG consult  Allergies  Allergen Reactions   Iodine Other (See Comments)    neck swells   Iohexol Swelling    Neck and gland swelling per patient.    Patient Measurements: Height: 5\' 9"  (175.3 cm) Weight: 72 kg (158 lb 11.7 oz) IBW/kg (Calculated) : 70.7 Heparin Dosing Weight: 72 kg  Vital Signs: Temp: 97.6 F (36.4 C) (06/18 0042) Temp Source: Oral (06/18 0042) BP: 93/54 (06/18 0042) Pulse Rate: 61 (06/17 2204)  Labs: Recent Labs    04/20/21 0747 04/21/21 0200 04/21/21 0225  HGB 13.9  --  12.1*  HCT 43.4  --  37.1*  PLT 192  --  162  APTT  --  65*  --   HEPARINUNFRC  --  0.22*  --   CREATININE 0.75  --   --      Estimated Creatinine Clearance: 74.9 mL/min (by C-G formula based on SCr of 0.75 mg/dL).   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   COPD (chronic obstructive pulmonary disease) ([place])    Diabetes type 2, controlled ([place]) 07/31/2017   Dysrhythmia    a fib   GERD (gastroesophageal reflux disease)    Hematuria    refuses work up or referral - understands risks of morbidity / mortality - 11/2008, 12/2008   History of hiatal hernia    History of kidney stones    Hyperlipemia    Meningioma ([place]) 10/25/2013   Follows with Dr. [name].    Peripheral vascular disease ([place])    Abdominal Aortic Aneursym   Pneumonia    as a child   Radiation 09/18/15-10/25/15   left lower lobe 70.2 Gy   Seizures ([place]) 02/18/2020   Tobacco abuse     Medications:  Infusions:   sodium chloride     heparin 1,000 Units/hr (04/20/21 1756)    Assessment: 80 yo male admitted after scheduled LHC for CABG eval.  On chronic Xarelto at home, last took dose 6/15 at noon.  Pharmacy asked to begin anticoagulation with  IV heparin 6 hrs after sheath pulled (removed 1130 AM).  6/18 AM update:  Heparin level and aPTT are low HL/aPTT also correlating-no need for further aPTT's  Goal of Therapy:  Heparin level 0.3-0.7 units/ml Monitor platelets by anticoagulation protocol: Yes   Plan:  Inc heparin to 1100 units/hr 8 hour heparin level Daily aPTT, heparin level and CBC F/u plans for CABG  Narda Bonds, PharmD, BCPS Clinical Pharmacist Phone: [phone]

## 2021-04-21 NOTE — Progress Notes (Addendum)
Progress Note  Patient Name: Robert Bates Date of Encounter: 04/21/2021  [facility] Cardiologist: Mertie Moores, MD   Subjective  Mild chest pain last night that has resolved  Inpatient Medications    Scheduled Meds:  aspirin EC  81 mg Oral Once   atorvastatin  80 mg Oral Daily   metoprolol tartrate  12.5 mg Oral BID   mometasone-formoterol  2 puff Inhalation BID   multivitamin with minerals  1 tablet Oral Q1500   pantoprazole  40 mg Oral Daily   sodium chloride flush  3 mL Intravenous Q12H   sodium chloride flush  3 mL Intravenous Q12H   tamsulosin  0.4 mg Oral Daily   Continuous Infusions:  sodium chloride     heparin 1,100 Units/hr (04/21/21 0449)   PRN Meds: sodium chloride, acetaminophen, albuterol, alum & mag hydroxide-simeth, nitroGLYCERIN, ondansetron (ZOFRAN) IV, sodium chloride flush   Vital Signs    Vitals:   04/21/21 0448 04/21/21 0713 04/21/21 0714 04/21/21 0848  BP: 105/65 103/69    Pulse:   (!) 57   Resp: 16  20   Temp: (!) 97.1 F (36.2 C)  (!) 97.2 F (36.2 C)   TempSrc: Axillary  Oral   SpO2: 97%  97% 97%  Weight:      Height:        Intake/Output Summary (Last 24 hours) at 04/21/2021 0901 Last data filed at 04/21/2021 0423 Gross per 24 hour  Intake 576.71 ml  Output 200 ml  Net 376.71 ml   Last 3 Weights 04/20/2021 04/20/2021 04/18/2021  Weight (lbs) 158 lb 11.7 oz 164 lb 164 lb  Weight (kg) 72 kg 74.39 kg 74.39 kg      Telemetry    SR - Personally Reviewed  ECG    N/a - Personally Reviewed  Physical Exam   GEN: No acute distress.   Neck: No JVD Cardiac: RRR, no murmurs, rubs, or gallops.  Respiratory: Clear to auscultation bilaterally. GI: Soft, nontender, non-distended  MS: No edema; No deformity. Neuro:  Nonfocal  Psych: Normal affect   Labs    High Sensitivity Troponin:  No results for input(s): TROPONINIHS in the last 720 hours.    Chemistry Recent Labs  Lab 04/20/21 0747  NA 139  K 3.9  CL 105  CO2 25   GLUCOSE 181*  BUN 13  CREATININE 0.75  CALCIUM 8.7*  GFRNONAA >60  ANIONGAP 9     Hematology Recent Labs  Lab 04/20/21 0747 04/21/21 0225  WBC 4.9 10.0  RBC 4.60 3.97*  HGB 13.9 12.1*  HCT 43.4 37.1*  MCV 94.3 93.5  MCH 30.2 30.5  MCHC 32.0 32.6  RDW 14.3 14.2  PLT 192 162    BNPNo results for input(s): BNP, PROBNP in the last 168 hours.   DDimer No results for input(s): DDIMER in the last 168 hours.   Radiology    CT CHEST WO CONTRAST  Result Date: 04/20/2021 CLINICAL DATA:  History of non-small cell lung cancer, coronary artery disease EXAM: CT CHEST WITHOUT CONTRAST TECHNIQUE: Multidetector CT imaging of the chest was performed following the standard protocol without IV contrast. COMPARISON:  04/10/2021, 06/20/2020 FINDINGS: Cardiovascular: Unenhanced imaging of the heart demonstrates stable trace pericardial fluid. Extensive atherosclerosis throughout the coronary vasculature, primarily in the LAD distribution, unchanged since prior exam. Stable ectasia of the thoracic aorta identified, measuring up to 3.9 cm. Diffuse atherosclerosis unchanged. Evaluation of the vascular lumen is limited without IV contrast. Mediastinum/Nodes: Stable subcentimeter mediastinal and  hilar lymph nodes. No pathologic adenopathy. Thyroid, trachea, and esophagus are unremarkable. Lungs/Pleura: Left upper lobe consolidation again identified, extending from the left hilum to the pleural surface, measuring up to 2.2 x 3.1 cm image 55/3, previously measuring 3.5 x 1.9 cm. Findings are most consistent with stable post therapeutic change after prior treatment for lung cancer. Stable soft tissue fullness in the left hilum, also likely related to prior therapy. Evaluation of the hilar structures is limited without IV contrast. Stable trace left pleural effusion. Background upper lobe predominant emphysema again noted unchanged. No acute airspace disease or pneumothorax. No new pulmonary nodules or masses.  Upper Abdomen: Moderate hiatal hernia again noted. No acute upper abdominal findings. Musculoskeletal: No acute or destructive bony lesions. Prior healed left anterolateral third rib fracture again noted. Stable sclerotic focus within the T8 vertebral body likely a bone island. Reconstructed images demonstrate no additional findings. IMPRESSION: 1. Chronic post radiation changes within the left lung and left hilum. No evidence of recurrence or metastatic disease on this exam. 2. Stable ectasia of the thoracic aorta, measuring up to 3.9 cm in diameter. 3. Aortic Atherosclerosis (ICD10-I70.0) and Emphysema (ICD10-J43.9). 4. Stable coronary artery atherosclerosis. 5. Stable hiatal hernia. Electronically Signed   By: Randa Ngo M.D.   On: 04/20/2021 20:53   CARDIAC CATHETERIZATION  Result Date: 04/20/2021  Mid LM to Dist LM lesion is 70% stenosed.  Ost Cx to Prox Cx lesion is 70% stenosed.  Mid Cx lesion is 90% stenosed.  3rd Mrg lesion is 100% stenosed.  Ost LAD to Prox LAD lesion is 80% stenosed.  1st Diag lesion is 70% stenosed.  Ramus lesion is 60% stenosed.  1.  Significant heavily calcified distal left main disease with significant calcified disease affecting the proximal LAD, large first diagonal, ostial ramus and left circumflex.  Occluded OM 3 with collaterals.  The coronary arteries are codominant with no obstructive disease affecting the right coronary artery. 2.  Normal LV systolic function and left ventricular end-diastolic pressure. Recommendations: The patient was in sinus rhythm on presentation but went into A. fib with RVR during catheterization.  He was given 5 mg of IV metoprolol but was still tachycardic.  Due to this and given his significant coronary artery disease, recommend admission to the hospital. Start heparin drip 6 hours after sheath pull. Recommend rate control for atrial fibrillation.  I started oral metoprolol and if rate is still not controlled, diltiazem drip or  amiodarone drip can be considered. I requested an echocardiogram. I will consult CT surgery for CABG evaluation.     Cardiac Studies    Patient Profile     Robert Bates is a 79 y.o. male with a hx of PAF, tobacco use with COPD and history of lung CA, peripheral vascular disease, HLD, chronic diastolic CHF and DM2 admitted after outpatient cath for multivessel CAD and afib with RVR.   Assessment & Plan    Chest pain -CCTA performed 04/16/21 that showed evidence of CAD in the [place], mLCX and mid Diag vessels with recommendations to pursue outpatient LHC  - 04/20/21 Cath: mid to distal LM 70%, ostial to prox LAD 80%, D1 70%, ramus 60%, ostial LCX 70% and mid 90%, OM3 occluded, RCA mild LIs - echo pending - CT surgery consulted,seen by PA awaiting attending review.  - medical therapy with lopressor 12.5mg  bid, heparin gtt, ASA 81, atorva 80. No ACE/ARB due to soft bp's  - f/u CT surgery eval. He asks about PCI, would be very  high risk with LM disease with coexisting prox LAD/LCX disease. Could have my interventional colleagues give a formal opinion Monday but would think unlikely a plausible option for him, certaintily suboptimal vs CABG as far as long term results.   2. PAF - history of PAF, went into afib with RVR after cath - home xarelto on hold for potential CABG, home dilt changed to lopressor in setting of CAD.   3. COPD/Lung CA  4. Hyperlipidemia - LDL 62, has been on pravastatin  5. COVID + - incidental + test during admission after cath, he denies any symptoms - will contact medicine to see if any additional recs - he has had covid vaccination x 3 pfizer  - discussed with medicine who recommends oral paxlovid. Discussed with pharmacy who will help arrange as inpatient.   For questions or updates, please contact [place] Please consult [email or web address] for contact info under        Signed, Carlyle Dolly, MD  04/21/2021, 9:01 AM

## 2021-04-21 NOTE — Progress Notes (Signed)
Patient back in AF, occasional RVR to 130s. Increase metop to 25 mg PO bid from 12.5 mg PO bid. Had some asx brady last night on cardizem per nursing report.

## 2021-04-22 ENCOUNTER — Inpatient Hospital Stay (HOSPITAL_COMMUNITY): Payer: Medicare Other

## 2021-04-22 DIAGNOSIS — I4891 Unspecified atrial fibrillation: Secondary | ICD-10-CM | POA: Diagnosis not present

## 2021-04-22 DIAGNOSIS — I200 Unstable angina: Secondary | ICD-10-CM | POA: Diagnosis not present

## 2021-04-22 LAB — BASIC METABOLIC PANEL
Anion gap: 5 (ref 5–15)
BUN: 15 mg/dL (ref 8–23)
CO2: 28 mmol/L (ref 22–32)
Calcium: 8.4 mg/dL — ABNORMAL LOW (ref 8.9–10.3)
Chloride: 106 mmol/L (ref 98–111)
Creatinine, Ser: 0.86 mg/dL (ref 0.61–1.24)
GFR, Estimated: >60 mL/min (ref >60)
Glucose, Bld: 84 mg/dL (ref 70–99)
Potassium: 3.4 mmol/L — ABNORMAL LOW (ref 3.5–5.1)
Sodium: 139 mmol/L (ref 135–145)

## 2021-04-22 LAB — CBC
HCT: 38.6 % — ABNORMAL LOW (ref 39.0–52.0)
Hemoglobin: 12.7 g/dL — ABNORMAL LOW (ref 13.0–17.0)
MCH: 30.8 pg (ref 26.0–34.0)
MCHC: 32.9 g/dL (ref 30.0–36.0)
MCV: 93.5 fL (ref 80.0–100.0)
Platelets: 165 10*3/uL (ref 150–400)
RBC: 4.13 MIL/uL — ABNORMAL LOW (ref 4.22–5.81)
RDW: 14.5 % (ref 11.5–15.5)
WBC: 7.4 10*3/uL (ref 4.0–10.5)
nRBC: 0 % (ref 0.0–0.2)

## 2021-04-22 LAB — ECHOCARDIOGRAM COMPLETE
Area-P 1/2: 2.73 cm2
Height: 69 in
S' Lateral: 3.2 cm
Weight: 2539.7 oz

## 2021-04-22 LAB — HEPARIN LEVEL (UNFRACTIONATED): Heparin Unfractionated: 0.53 IU/mL (ref 0.30–0.70)

## 2021-04-22 MED ORDER — PERFLUTREN LIPID MICROSPHERE
1.0000 mL | INTRAVENOUS | Status: AC | PRN
  Administered 2021-04-22: 2 mL via INTRAVENOUS
  Filled 2021-04-22: qty 10

## 2021-04-22 NOTE — Progress Notes (Signed)
[place] for heparin Indication: atrial fibrillation  Allergies  Allergen Reactions   Iodine Other (See Comments)    neck swells   Iohexol Swelling    Neck and gland swelling per patient.    Patient Measurements: Height: 5\' 9"  (175.3 cm) Weight: 72 kg (158 lb 11.7 oz) IBW/kg (Calculated) : 70.7 Heparin Dosing Weight: 72 kg  Vital Signs: Temp: 97.5 F (36.4 C) (06/18 2020) Temp Source: Oral (06/18 2020) BP: 140/91 (06/18 2020) Pulse Rate: 95 (06/18 2020)  Labs: Recent Labs    04/20/21 0747 04/21/21 0200 04/21/21 0225 04/21/21 1149 04/21/21 2124  HGB 13.9  --  12.1*  --   --   HCT 43.4  --  37.1*  --   --   PLT 192  --  162  --   --   APTT  --  65*  --   --   --   HEPARINUNFRC  --  0.22*  --  0.42 0.53  CREATININE 0.75  --   --   --   --      Estimated Creatinine Clearance: 74.9 mL/min (by C-G formula based on SCr of 0.75 mg/dL).   Assessment: 80 y.o. male with h/o Afib, Xarelto on hold, for heparin  Goal of Therapy:  Heparin level 0.3-0.7 units/ml Monitor platelets by anticoagulation protocol: Yes   Plan:  Continue Heparin at current rate  Phillis Knack, PharmD, BCPS

## 2021-04-22 NOTE — Progress Notes (Signed)
[place] for IV Heparin Indication:  CAD awaiting CABG consult  Allergies  Allergen Reactions   Iodine Other (See Comments)    neck swells   Iohexol Swelling    Neck and gland swelling per patient.    Patient Measurements: Height: 5\' 9"  (175.3 cm) Weight: 72 kg (158 lb 11.7 oz) IBW/kg (Calculated) : 70.7 kg Heparin Dosing Weight: 72 kg  Vital Signs: Temp: 97.5 F (36.4 C) (06/19 0300) Temp Source: Oral (06/19 0300) BP: 109/75 (06/19 0300) Pulse Rate: 60 (06/19 0300)  Labs: Recent Labs    04/20/21 0747 04/20/21 0747 04/21/21 0200 04/21/21 0225 04/21/21 1149 04/21/21 2124 04/22/21 0250  HGB 13.9  --   --  12.1*  --   --  12.7*  HCT 43.4  --   --  37.1*  --   --  38.6*  PLT 192  --   --  162  --   --  165  APTT  --   --  65*  --   --   --   --   HEPARINUNFRC  --    < > 0.22*  --  0.42 0.53 0.53  CREATININE 0.75  --   --   --   --   --  0.86   < > = values in this interval not displayed.     Estimated Creatinine Clearance: 69.6 mL/min (by C-G formula based on SCr of 0.86 mg/dL).   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   COPD (chronic obstructive pulmonary disease) ([place])    Diabetes type 2, controlled ([place]) 07/31/2017   Dysrhythmia    a fib   GERD (gastroesophageal reflux disease)    Hematuria    refuses work up or referral - understands risks of morbidity / mortality - 11/2008, 12/2008   History of hiatal hernia    History of kidney stones    Hyperlipemia    Meningioma ([place]) 10/25/2013   Follows with Dr. [name].    Peripheral vascular disease ([place])    Abdominal Aortic Aneursym   Pneumonia    as a child   Radiation 09/18/15-10/25/15   left lower lobe 70.2 Gy   Seizures ([place]) 02/18/2020   Tobacco abuse     Medications:  Infusions:    sodium chloride     heparin 1,100 Units/hr (04/21/21 1654)    Assessment: 80 yo male is s/p cardiac cath on 6/17 which showed 3vCAD with LM involvement. The patient is now being evaluated for CABG. PTA the patient is on rivaroxaban (last dose 6/15 at noon). Pharmacy is consulted to dose heparin.  Heparin level is therapeutic at 0.53 while running at 1100 units/hr. The heparin levels and aPTT are correlated, therefore, will monitor using heparin levels only. Per the RN, there have been no issues with the infusion and the patient is without signs or symptoms of bleeding. Hgb 12.7, platelets 165.  Goal of Therapy:  Heparin level 0.3-0.7 units/ml Monitor platelets by anticoagulation protocol: Yes   Plan:  -Continue heparin IV at 1100 units/hr -Obtain a daily heparin level and CBC -Monitor for signs and symptoms of bleeding -Follow CABG plans  Shauna Hugh, PharmD, [place]  PGY-1 Pharmacy Resident 04/22/2021 8:07 AM  Please check [email or web address] for unit-specific pharmacy phone numbers.

## 2021-04-22 NOTE — Progress Notes (Signed)
Progress Note  Patient Name: Robert Bates Date of Encounter: 04/22/2021  [facility] Cardiologist: Mertie Moores, MD   Subjective   No complaints  Inpatient Medications    Scheduled Meds:  aspirin EC  81 mg Oral Once   metoprolol tartrate  12.5 mg Oral BID   mometasone-formoterol  2 puff Inhalation BID   multivitamin with minerals  1 tablet Oral Q1500   nirmatrelvir/ritonavir EUA  3 tablet Oral BID   pantoprazole  40 mg Oral Daily   sodium chloride flush  3 mL Intravenous Q12H   sodium chloride flush  3 mL Intravenous Q12H   tamsulosin  0.4 mg Oral Daily   Continuous Infusions:  sodium chloride     heparin 1,100 Units/hr (04/21/21 1654)   PRN Meds: sodium chloride, acetaminophen, albuterol, alum & mag hydroxide-simeth, nitroGLYCERIN, ondansetron (ZOFRAN) IV, sodium chloride flush   Vital Signs    Vitals:   04/21/21 1229 04/21/21 2020 04/22/21 0011 04/22/21 0300  BP: 100/71 (!) 140/91 102/72 109/75  Pulse: (!) 56 95 61 60  Resp: 18 18  17   Temp: (!) 97.5 F (36.4 C) (!) 97.5 F (36.4 C) (!) 97.4 F (36.3 C) (!) 97.5 F (36.4 C)  TempSrc: Oral Oral Oral Oral  SpO2: 97% 99% 97% 98%  Weight:      Height:        Intake/Output Summary (Last 24 hours) at 04/22/2021 0737 Last data filed at 04/22/2021 0700 Gross per 24 hour  Intake --  Output 650 ml  Net -650 ml   Last 3 Weights 04/20/2021 04/20/2021 04/18/2021  Weight (lbs) 158 lb 11.7 oz 164 lb 164 lb  Weight (kg) 72 kg 74.39 kg 74.39 kg      Telemetry    Sinus brady - Personally Reviewed  ECG    N/a - Personally Reviewed  Physical Exam   GEN: No acute distress.   Neck: No JVD Cardiac: RRR, no murmurs, rubs, or gallops.  Respiratory: Clear to auscultation bilaterally. GI: Soft, nontender, non-distended  MS: No edema; No deformity. Neuro:  Nonfocal  Psych: Normal affect   Labs    High Sensitivity Troponin:  No results for input(s): TROPONINIHS in the last 720 hours.    Chemistry Recent  Labs  Lab 04/20/21 0747 04/22/21 0250  NA 139 139  K 3.9 3.4*  CL 105 106  CO2 25 28  GLUCOSE 181* 84  BUN 13 15  CREATININE 0.75 0.86  CALCIUM 8.7* 8.4*  GFRNONAA >60 >60  ANIONGAP 9 5     Hematology Recent Labs  Lab 04/20/21 0747 04/21/21 0225 04/22/21 0250  WBC 4.9 10.0 7.4  RBC 4.60 3.97* 4.13*  HGB 13.9 12.1* 12.7*  HCT 43.4 37.1* 38.6*  MCV 94.3 93.5 93.5  MCH 30.2 30.5 30.8  MCHC 32.0 32.6 32.9  RDW 14.3 14.2 14.5  PLT 192 162 165    BNPNo results for input(s): BNP, PROBNP in the last 168 hours.   DDimer No results for input(s): DDIMER in the last 168 hours.   Radiology    CT CHEST WO CONTRAST  Result Date: 04/20/2021 CLINICAL DATA:  History of non-small cell lung cancer, coronary artery disease EXAM: CT CHEST WITHOUT CONTRAST TECHNIQUE: Multidetector CT imaging of the chest was performed following the standard protocol without IV contrast. COMPARISON:  04/10/2021, 06/20/2020 FINDINGS: Cardiovascular: Unenhanced imaging of the heart demonstrates stable trace pericardial fluid. Extensive atherosclerosis throughout the coronary vasculature, primarily in the LAD distribution, unchanged since prior exam. Stable ectasia  of the thoracic aorta identified, measuring up to 3.9 cm. Diffuse atherosclerosis unchanged. Evaluation of the vascular lumen is limited without IV contrast. Mediastinum/Nodes: Stable subcentimeter mediastinal and hilar lymph nodes. No pathologic adenopathy. Thyroid, trachea, and esophagus are unremarkable. Lungs/Pleura: Left upper lobe consolidation again identified, extending from the left hilum to the pleural surface, measuring up to 2.2 x 3.1 cm image 55/3, previously measuring 3.5 x 1.9 cm. Findings are most consistent with stable post therapeutic change after prior treatment for lung cancer. Stable soft tissue fullness in the left hilum, also likely related to prior therapy. Evaluation of the hilar structures is limited without IV contrast. Stable  trace left pleural effusion. Background upper lobe predominant emphysema again noted unchanged. No acute airspace disease or pneumothorax. No new pulmonary nodules or masses. Upper Abdomen: Moderate hiatal hernia again noted. No acute upper abdominal findings. Musculoskeletal: No acute or destructive bony lesions. Prior healed left anterolateral third rib fracture again noted. Stable sclerotic focus within the T8 vertebral body likely a bone island. Reconstructed images demonstrate no additional findings. IMPRESSION: 1. Chronic post radiation changes within the left lung and left hilum. No evidence of recurrence or metastatic disease on this exam. 2. Stable ectasia of the thoracic aorta, measuring up to 3.9 cm in diameter. 3. Aortic Atherosclerosis (ICD10-I70.0) and Emphysema (ICD10-J43.9). 4. Stable coronary artery atherosclerosis. 5. Stable hiatal hernia. Electronically Signed   By: Randa Ngo M.D.   On: 04/20/2021 20:53   CARDIAC CATHETERIZATION  Result Date: 04/20/2021  Mid LM to Dist LM lesion is 70% stenosed.  Ost Cx to Prox Cx lesion is 70% stenosed.  Mid Cx lesion is 90% stenosed.  3rd Mrg lesion is 100% stenosed.  Ost LAD to Prox LAD lesion is 80% stenosed.  1st Diag lesion is 70% stenosed.  Ramus lesion is 60% stenosed.  1.  Significant heavily calcified distal left main disease with significant calcified disease affecting the proximal LAD, large first diagonal, ostial ramus and left circumflex.  Occluded OM 3 with collaterals.  The coronary arteries are codominant with no obstructive disease affecting the right coronary artery. 2.  Normal LV systolic function and left ventricular end-diastolic pressure. Recommendations: The patient was in sinus rhythm on presentation but went into A. fib with RVR during catheterization.  He was given 5 mg of IV metoprolol but was still tachycardic.  Due to this and given his significant coronary artery disease, recommend admission to the hospital. Start  heparin drip 6 hours after sheath pull. Recommend rate control for atrial fibrillation.  I started oral metoprolol and if rate is still not controlled, diltiazem drip or amiodarone drip can be considered. I requested an echocardiogram. I will consult CT surgery for CABG evaluation.     Cardiac Studies     Patient Profile        [place] is a 80 y.o. male with a hx of PAF, tobacco use with COPD and history of lung CA, peripheral vascular disease, HLD, chronic diastolic CHF and DM2 admitted after outpatient cath for multivessel CAD and afib with RVR.   Assessment & Plan    Chest pain -CCTA performed 04/16/21 that showed evidence of CAD in the [place], mLCX and mid Diag vessels with recommendations to pursue outpatient LHC   - 04/20/21 Cath: mid to distal LM 70%, ostial to prox LAD 80%, D1 70%, ramus 60%, ostial LCX 70% and mid 90%, OM3 occluded, RCA mild LIs - echo pending - CT surgery consulted,seen by PA awaiting attending review. -  medical therapy with lopressor 12.5mg  bid, heparin gtt, ASA 81, No ACE/ARB due to soft bp's. Statin on hold since starting paxlovid.    - f/u CT surgery eval. He asks about PCI, would be very high risk with LM disease with coexisting prox LAD/LCX disease. Could have my interventional colleagues give a formal opinion Monday but would think unlikely a plausible option for him. COVID + status also plays a role.  - continue current meds   2. PAF - history of PAF, went into afib with RVR after cath - home xarelto on hold for potential CABG, home dilt changed to lopressor in setting of CAD. - some bradycardia last night to 40s crosscover called about - back in SR. Continue lopressor, since on paxlovid would need to avoid amio while on.    3. COPD/Lung CA   4. Hyperlipidemia - LDL 62, has been on pravastatin   5. COVID + - incidental + test during admission after cath, he denies any symptoms - will contact medicine to see if any additional recs - he has had  covid vaccination x 3 pfizer   - discussed with medicine who recommends oral paxlovid. Discussed with pharmacy who will help arrange as inpatient, have to hold statin while on.   For questions or updates, please contact [place] Please consult [email or web address] for contact info under        Signed, Carlyle Dolly, MD  04/22/2021, 7:37 AM

## 2021-04-22 NOTE — Progress Notes (Signed)
  Echocardiogram 2D Echocardiogram has been performed.  Fidel Levy 04/22/2021, 10:14 AM

## 2021-04-23 DIAGNOSIS — U071 COVID-19: Secondary | ICD-10-CM

## 2021-04-23 DIAGNOSIS — I200 Unstable angina: Secondary | ICD-10-CM | POA: Diagnosis not present

## 2021-04-23 DIAGNOSIS — E119 Type 2 diabetes mellitus without complications: Secondary | ICD-10-CM

## 2021-04-23 HISTORY — DX: COVID-19: U07.1

## 2021-04-23 LAB — RESP PANEL BY RT-PCR (FLU A&B, COVID) ARPGX2
Influenza A by PCR: NEGATIVE
Influenza B by PCR: NEGATIVE
SARS Coronavirus 2 by RT PCR: POSITIVE — AB

## 2021-04-23 LAB — CBC
HCT: 44.3 % (ref 39.0–52.0)
Hemoglobin: 14.2 g/dL (ref 13.0–17.0)
MCH: 30.3 pg (ref 26.0–34.0)
MCHC: 32.1 g/dL (ref 30.0–36.0)
MCV: 94.7 fL (ref 80.0–100.0)
Platelets: 179 10*3/uL (ref 150–400)
RBC: 4.68 MIL/uL (ref 4.22–5.81)
RDW: 14.5 % (ref 11.5–15.5)
WBC: 8.2 10*3/uL (ref 4.0–10.5)
nRBC: 0 % (ref 0.0–0.2)

## 2021-04-23 LAB — HEPARIN LEVEL (UNFRACTIONATED): Heparin Unfractionated: 0.41 IU/mL (ref 0.30–0.70)

## 2021-04-23 MED ORDER — ROSUVASTATIN CALCIUM 20 MG PO TABS: 40.0000 mg | ORAL_TABLET | Freq: Every day | ORAL | Status: DC

## 2021-04-23 MED ORDER — POTASSIUM CHLORIDE CRYS ER 20 MEQ PO TBCR
40.0000 meq | EXTENDED_RELEASE_TABLET | Freq: Once | ORAL | Status: AC
  Administered 2021-04-23: 40 meq via ORAL
  Filled 2021-04-23: qty 2

## 2021-04-23 MED ORDER — AMIODARONE HCL 200 MG PO TABS
200.0000 mg | ORAL_TABLET | Freq: Two times a day (BID) | ORAL | Status: DC
  Administered 2021-04-23: 200 mg via ORAL
  Filled 2021-04-23: qty 1

## 2021-04-23 MED ORDER — TAMSULOSIN HCL 0.4 MG PO CAPS
0.8000 mg | ORAL_CAPSULE | Freq: Every day | ORAL | Status: DC
  Administered 2021-04-24 – 2021-04-26 (×3): 0.8 mg via ORAL
  Filled 2021-04-23 (×3): qty 2

## 2021-04-23 MED ORDER — METOPROLOL TARTRATE 12.5 MG HALF TABLET
12.5000 mg | ORAL_TABLET | Freq: Two times a day (BID) | ORAL | Status: DC
  Administered 2021-04-24 – 2021-04-26 (×5): 12.5 mg via ORAL
  Filled 2021-04-23 (×5): qty 1

## 2021-04-23 MED FILL — Heparin Sodium (Porcine) Inj 1000 Unit/ML: INTRAMUSCULAR | Qty: 10 | Status: AC

## 2021-04-23 NOTE — Progress Notes (Signed)
Monitor reviewed, last night 1 beat run of wide complex tachycardia, irregular. Patient reported waking up last night with shortness of breath that resolved. Did not report to nurse. Unsure if related to to arrthymia.  Patient denies any chest pain or shortness of breath this morning.

## 2021-04-23 NOTE — Consult Note (Signed)
[facility] for Infectious Disease    Date of Admission:  04/20/2021            Reason for Consult: COVID+, Needs CABG ?upcoming surgery timing    Referring Provider: Atkins/O'Neal Primary Care Provider: [name], NP   Assessment: Robert Bates is a 80 y.o. male  admitted for further work of of atypical chest pain that has developed over the last few months. 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 He is followed by cardiology and cardiac surgery team with regards to his need for CABG. He was found to have (+) COVID test here in the hospital with non-severe symptoms, if any at all. He is vaccinated and boosted. Tolerating the paxlovid well without side effect, though not clear he will have benefit from it as it is difficult to determine COVID symptom onset for him.   Regarding surgical timing, in review of available literature in surgical and anesthesia journals there is a significantly higher mortality and operative complication risk within the first 8 weeks from COVID diagnosis; thereafter there is no increased operative risk/mortality. Would recommend to have cardiac surgery discuss further with cardiac anesthesia to go over risk/benefit stratification for him if his CABG need is more urgent.    Would complete PFTs and other pre-CABG work up prior to given pneumonia was a higher burden complication risk esp with lung cancer and smoking history.  Will check COVID PCR to assess cycle threshold to see if this gives any helpful information.   Drug interaction between paxlovid and patient's Amiodarone = high grade drug interaction between these two agents; ritonavir can significantly amplify levels of amiodarone in the blood for prolonged time frame given the very long half life - Would recommend to stop paxlovid and consider alternative COVID treatment or continue to hold the Amiodarone  until 48 hours after he completes last dose of Paxlovid.   ADDENDUM: 12:16 PM spoke with cardiology - will stop Amio for now and follow telemetry. Can consider adding back Friday 6/24 48h after he completes paxlovid.    Plan: Would hold amiodarone until Friday 6/24 8 weeks is current recommendation to delay surgeries following COVID infection  Would recommend cardiac surgery discuss further with cardiac anesthesia to go over risk/benefit stratification for him if his CABG need is more urgent.   PFTs and other pre-CABG work up as outlined with TCTS team.      Principal Problem:   Unstable angina ([employer]) Active Problems:   Hyperlipidemia   HTN (hypertension)   COPD GOLD II    Diabetes type 2, controlled ([place])   PAF (paroxysmal atrial fibrillation) (HCC)   COVID-19 virus infection    amiodarone  200 mg Oral BID   aspirin EC  81 mg Oral Once   mometasone-formoterol  2 puff Inhalation BID   multivitamin with minerals  1 tablet Oral Q1500   nirmatrelvir/ritonavir EUA  3 tablet Oral BID   pantoprazole  40 mg Oral Daily   sodium chloride flush  3 mL Intravenous Q12H   sodium chloride flush  3 mL Intravenous Q12H   tamsulosin  0.4 mg Oral Daily    HPI: Robert Bates is a 80 y.o. male admitted under cardiology service for abnormal CCTA and follow up.   PMHx lung CA, tobacco use with COPD (quit smoking cigarettes x 62yr, occ cigar smoker currently), PVD, HLD, diastolic CHF, DM2 (E3X 5.4% June 2022),  PAF (on chronic AC with xarelto).   He is followed by Dr. Cathie Olden normally for PAF. He describes that he has had a head pain / ache and sinus like symptoms for 4-5 weeks now.  Characterizes some dizziness. He has a h/o 2 meningiomas that he says are unchanged based on a recent head CT scan. His PCP referred him back to cardiology about 6 weeks ago for atypical chest pain. He underwent CCTA 6/13 which showed possible flow limiting lesions in tAD, mlCX and mid Diag vessels in which he was  hospitalized for LHC to investigate further.   He denies any current or recent fevers, chills, diarrhea, vomiting, new cough, sore throat. Has had some frontal headaches (possible sinus symptoms) for about 4-5 weeks now.   He is a Administrator. Smokes occasional cigars.    Review of Systems: Review of Systems  Constitutional:  Negative for chills, fever, malaise/fatigue and weight loss.  HENT:  Negative for congestion, sinus pain and sore throat.   Eyes:  Negative for blurred vision.  Respiratory:  Negative for cough, sputum production and shortness of breath.   Cardiovascular:  Positive for chest pain. Negative for orthopnea, leg swelling and PND.  Gastrointestinal:  Negative for abdominal pain, diarrhea and vomiting.  Genitourinary: Negative.   Musculoskeletal:  Negative for back pain and neck pain.  Skin:  Negative for rash.  Neurological:  Positive for dizziness and headaches. Negative for focal weakness and weakness.   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   COPD (chronic obstructive pulmonary disease) ([place])    Diabetes type 2, controlled ([place]) 07/31/2017   Dysrhythmia    a fib   GERD (gastroesophageal reflux disease)    Hematuria    refuses work up or referral - understands risks of morbidity / mortality - 11/2008, 12/2008   History of hiatal hernia    History of kidney stones    Hyperlipemia    Meningioma ([place]) 10/25/2013   Follows with Dr. [name].    Peripheral vascular disease ([place])    Abdominal Aortic Aneursym   Pneumonia    as a child   Radiation 09/18/15-10/25/15   left lower lobe 70.2 Gy   Seizures ([place]) 02/18/2020   Tobacco abuse     Social History   Tobacco Use   Smoking status: Former    Packs/day: 1.00    Years: 57.00    Pack years: 57.00    Types: Cigarettes    Quit date: 08/08/2015    Years  since quitting: 5.7   Smokeless tobacco: Former    Types: Chew    Quit date: 11/04/1958   Tobacco comments:    occasional use sneaks around  Vaping Use   Vaping Use: Every day  Substance Use Topics   Alcohol use: Not Currently    Alcohol/week: 0.0 standard drinks   Drug use: No    Family History  Problem Relation Age of Onset   Leukemia Father    Emphysema Father    Learning disabilities Son    Leukemia Other    Stroke Other    Allergies  Allergen Reactions   Iodine Other (See Comments)    neck swells   Iohexol Swelling    Neck and gland swelling per patient.    OBJECTIVE: Blood pressure 116/78, pulse [name])  53, temperature 97.8 F (36.6 C), temperature source Oral, resp. rate 16, height 5\' 9"  (1.753 m), weight 72 kg, SpO2 98 %.  Physical Exam Vitals and nursing note reviewed.  Constitutional:      Appearance: He is well-developed.     Comments: Seated comfortably on the side of bed during interview. Pleasant. No distress.   HENT:     Nose: No congestion.     Mouth/Throat:     Mouth: Mucous membranes are dry.     Dentition: Normal dentition. No dental abscesses.     Pharynx: Oropharynx is clear.  Eyes:     General: No scleral icterus.    Pupils: Pupils are equal, round, and reactive to light.  Cardiovascular:     Rate and Rhythm: Normal rate. Rhythm irregular.     Heart sounds: Normal heart sounds.  Pulmonary:     Effort: Pulmonary effort is normal.     Breath sounds: Normal breath sounds.  Abdominal:     General: There is no distension.     Palpations: Abdomen is soft.     Tenderness: There is no abdominal tenderness.  Musculoskeletal:        General: No swelling.  Lymphadenopathy:     Cervical: No cervical adenopathy.  Skin:    General: Skin is warm and dry.     Capillary Refill: Capillary refill takes less than 2 seconds.     Findings: No rash.  Neurological:     Mental Status: He is alert and oriented to person, place, and time.  Psychiatric:         Judgment: Judgment normal.    Lab Results Lab Results  Component Value Date   WBC 7.4 04/22/2021   HGB 12.7 (L) 04/22/2021   HCT 38.6 (L) 04/22/2021   MCV 93.5 04/22/2021   PLT 165 04/22/2021    Lab Results  Component Value Date   CREATININE 0.86 04/22/2021   BUN 15 04/22/2021   NA 139 04/22/2021   K 3.4 (L) 04/22/2021   CL 106 04/22/2021   CO2 28 04/22/2021    Lab Results  Component Value Date   ALT 12 12/08/2020   AST 13 12/08/2020   ALKPHOS 71 06/20/2020   BILITOT 0.4 12/08/2020     Microbiology: Recent Results (from the past 240 hour(s))  SARS CORONAVIRUS 2 (TAT 6-24 HRS) Nasopharyngeal Nasopharyngeal Swab     Status: Abnormal   Collection Time: 04/20/21 11:44 PM   Specimen: Nasopharyngeal Swab  Result Value Ref Range Status   SARS Coronavirus 2 POSITIVE (A) NEGATIVE Final    Comment: (NOTE) SARS-CoV-2 target nucleic acids are DETECTED.  The SARS-CoV-2 RNA is generally detectable in upper and lower respiratory specimens during the acute phase of infection. Positive results are indicative of the presence of SARS-CoV-2 RNA. Clinical correlation with patient history and other diagnostic information is  necessary to determine patient infection status. Positive results do not rule out bacterial infection or co-infection with other viruses.  The expected result is Negative.  Fact Sheet for Patients: SugarRoll.be  Fact Sheet for Healthcare Providers: https://www.woods-mathews.com/  This test is not yet approved or cleared by the [place] FDA and  has been authorized for detection and/or diagnosis of SARS-CoV-2 by FDA under an Emergency Use Authorization (EUA). This EUA will remain  in effect (meaning this test can be used) for the duration of the COVID-19 declaration under Section 564(b)(1) of the Act, 21 U. S.C. section 360bbb-3(b)(1), unless the authorization is terminated or revoked  sooner.   Performed at [facility], [place] [address] [place], [place] [address]   Surgical PCR screen     Status: None   Collection Time: 04/20/21 11:45 PM   Specimen: Nasopharyngeal Swab; Nasal Swab  Result Value Ref Range Status   MRSA, PCR NEGATIVE NEGATIVE Final   Staphylococcus aureus NEGATIVE NEGATIVE Final    Comment: (NOTE) The Xpert SA Assay (FDA approved for NASAL specimens in patients 74 years of age and older), is one component of a comprehensive surveillance program. It is not intended to diagnose infection nor to guide or monitor treatment. Performed at [facility], [place] [address] [place], [place] [address]     Janene Madeira, MSN, NP-C [facility] for Infectious [place] Cell: [phone] Pager: [phone]  04/23/2021 11:51 AM

## 2021-04-23 NOTE — Progress Notes (Signed)
Cardiology Progress Note  Patient ID: Robert Bates MRN: [id number] DOB: February 18, 1941 Date of Encounter: 04/23/2021  Primary Cardiologist: Mertie Moores, MD  Subjective   Chief Complaint: None.  HPI: Nonsustained ventricular tachycardia overnight.  No symptoms of chest pain.  Incidentally found to have COVID but has no symptoms.  I reviewed his Films with him.  He has no percutaneous options.  CABG is his only option.  ROS:  All other ROS reviewed and negative. Pertinent positives noted in the HPI.     Inpatient Medications  Scheduled Meds:  aspirin EC  81 mg Oral Once   metoprolol tartrate  12.5 mg Oral BID   mometasone-formoterol  2 puff Inhalation BID   multivitamin with minerals  1 tablet Oral Q1500   nirmatrelvir/ritonavir EUA  3 tablet Oral BID   pantoprazole  40 mg Oral Daily   potassium chloride  40 mEq Oral Once   rosuvastatin  40 mg Oral Daily   sodium chloride flush  3 mL Intravenous Q12H   sodium chloride flush  3 mL Intravenous Q12H   tamsulosin  0.4 mg Oral Daily   Continuous Infusions:  sodium chloride     heparin 1,100 Units/hr (04/22/21 1709)   PRN Meds: sodium chloride, acetaminophen, albuterol, alum & mag hydroxide-simeth, nitroGLYCERIN, ondansetron (ZOFRAN) IV, sodium chloride flush   Vital Signs   Vitals:   04/22/21 1612 04/22/21 2235 04/23/21 0450 04/23/21 0743  BP: 105/66 109/71 107/68 116/78  Pulse: (!) 54 65 (!) 53 (!) 53  Resp: 16 16 16 16   Temp: 97.7 F (36.5 C) 98.2 F (36.8 C) 98 F (36.7 C) 97.8 F (36.6 C)  TempSrc: Oral Oral Oral Oral  SpO2: 98% 98% 98%   Weight:      Height:        Intake/Output Summary (Last 24 hours) at 04/23/2021 0930 Last data filed at 04/23/2021 0448 Gross per 24 hour  Intake --  Output 1125 ml  Net -1125 ml   Last 3 Weights 04/20/2021 04/20/2021 04/18/2021  Weight (lbs) 158 lb 11.7 oz 164 lb 164 lb  Weight (kg) 72 kg 74.39 kg 74.39 kg      Telemetry  Overnight telemetry shows nonsustained VT, sinus  rhythm, which I personally reviewed.   ECG  The most recent ECG shows atrial fibrillation heart rate 80, no acute ischemic changes, which I personally reviewed.   Physical Exam   Vitals:   04/22/21 1612 04/22/21 2235 04/23/21 0450 04/23/21 0743  BP: 105/66 109/71 107/68 116/78  Pulse: (!) 54 65 (!) 53 (!) 53  Resp: 16 16 16 16   Temp: 97.7 F (36.5 C) 98.2 F (36.8 C) 98 F (36.7 C) 97.8 F (36.6 C)  TempSrc: Oral Oral Oral Oral  SpO2: 98% 98% 98%   Weight:      Height:        Intake/Output Summary (Last 24 hours) at 04/23/2021 0930 Last data filed at 04/23/2021 0448 Gross per 24 hour  Intake --  Output 1125 ml  Net -1125 ml    Last 3 Weights 04/20/2021 04/20/2021 04/18/2021  Weight (lbs) 158 lb 11.7 oz 164 lb 164 lb  Weight (kg) 72 kg 74.39 kg 74.39 kg    Body mass index is 23.44 kg/m.  General: Well nourished, well developed, in no acute distress Head: Atraumatic, normal size  Eyes: PEERLA, EOMI  Neck: Supple, no JVD Endocrine: No thryomegaly Cardiac: Normal S1, S2; RRR; no murmurs, rubs, or gallops Lungs: Clear to auscultation bilaterally, no wheezing,  rhonchi or rales  Abd: Soft, nontender, no hepatomegaly  Ext: No edema, pulses 2+, right radial cath site clean and dry without evidence of hematoma or bruit Musculoskeletal: No deformities, BUE and BLE strength normal and equal Skin: Warm and dry, no rashes   Neuro: Alert and oriented to person, place, time, and situation, CNII-XII grossly intact, no focal deficits  Psych: Normal mood and affect   Labs  High Sensitivity Troponin:  No results for input(s): TROPONINIHS in the last 720 hours.   Cardiac EnzymesNo results for input(s): TROPONINI in the last 168 hours. No results for input(s): TROPIPOC in the last 168 hours.  Chemistry Recent Labs  Lab 04/20/21 0747 04/22/21 0250  NA 139 139  K 3.9 3.4*  CL 105 106  CO2 25 28  GLUCOSE 181* 84  BUN 13 15  CREATININE 0.75 0.86  CALCIUM 8.7* 8.4*  GFRNONAA >60 >60   ANIONGAP 9 5    Hematology Recent Labs  Lab 04/20/21 0747 04/21/21 0225 04/22/21 0250  WBC 4.9 10.0 7.4  RBC 4.60 3.97* 4.13*  HGB 13.9 12.1* 12.7*  HCT 43.4 37.1* 38.6*  MCV 94.3 93.5 93.5  MCH 30.2 30.5 30.8  MCHC 32.0 32.6 32.9  RDW 14.3 14.2 14.5  PLT 192 162 165   BNPNo results for input(s): BNP, PROBNP in the last 168 hours.  DDimer No results for input(s): DDIMER in the last 168 hours.   Radiology  ECHOCARDIOGRAM COMPLETE  Result Date: 04/22/2021    ECHOCARDIOGRAM REPORT   Patient Name:   Robert Bates Date of Exam: 04/22/2021 Medical Rec #:  [id number]     Height:       69.0 in Accession #:    [id number]    Weight:       158.7 lb Date of Birth:  [birth year]     BSA:          1.873 m Patient Age:    80 years      BP:           109/75 mmHg Patient Gender: M             HR:           53 bpm. Exam Location:  Inpatient Procedure: 2D Echo, Color Doppler, Cardiac Doppler and Intracardiac            Opacification Agent Indications:    I48.91* Unspeicified atrial fibrillation  History:        Patient has prior history of Echocardiogram examinations, most                 recent 07/12/2017. COPD; Risk Factors:Diabetes, Dyslipidemia and                 Current Smoker.  Sonographer:    Bernadene Person RDCS Referring Phys: [place]  1. Left ventricular ejection fraction, by estimation, is 60 to 65%. The left ventricle has normal function. The left ventricle has no regional wall motion abnormalities. Left ventricular diastolic parameters are consistent with Grade II diastolic dysfunction (pseudonormalization). Elevated left ventricular end-diastolic pressure. The E/e' is 25.  2. Right ventricular systolic function is mildly reduced. The right ventricular size is normal. There is mildly elevated pulmonary artery systolic pressure. The estimated right ventricular systolic pressure is 50.0 mmHg.  3. The mitral valve is grossly normal. Trivial mitral valve regurgitation.  4. The  aortic valve is tricuspid. Aortic valve regurgitation is not visualized.  5. Aortic dilatation noted. There  is mild dilatation of the ascending aorta, measuring 40 mm.  6. The inferior vena cava is dilated in size with <50% respiratory variability, suggesting right atrial pressure of 15 mmHg. FINDINGS  Left Ventricle: Left ventricular ejection fraction, by estimation, is 60 to 65%. The left ventricle has normal function. The left ventricle has no regional wall motion abnormalities. Definity contrast agent was given IV to delineate the left ventricular  endocardial borders. The left ventricular internal cavity size was normal in size. There is no left ventricular hypertrophy. Left ventricular diastolic parameters are consistent with Grade II diastolic dysfunction (pseudonormalization). Elevated left ventricular end-diastolic pressure. The E/e' is 16. Right Ventricle: The right ventricular size is normal. No increase in right ventricular wall thickness. Right ventricular systolic function is mildly reduced. There is mildly elevated pulmonary artery systolic pressure. The tricuspid regurgitant velocity  is 2.55 m/s, and with an assumed right atrial pressure of 15 mmHg, the estimated right ventricular systolic pressure is 17.4 mmHg. Left Atrium: Left atrial size was normal in size. Right Atrium: Right atrial size was normal in size. Pericardium: There is no evidence of pericardial effusion. Mitral Valve: The mitral valve is grossly normal. Trivial mitral valve regurgitation. Tricuspid Valve: The tricuspid valve is grossly normal. Tricuspid valve regurgitation is trivial. Aortic Valve: The aortic valve is tricuspid. Aortic valve regurgitation is not visualized. Pulmonic Valve: The pulmonic valve was normal in structure. Pulmonic valve regurgitation is not visualized. Aorta: Aortic dilatation noted. There is mild dilatation of the ascending aorta, measuring 40 mm. Venous: The inferior vena cava is dilated in size with  less than 50% respiratory variability, suggesting right atrial pressure of 15 mmHg. IAS/Shunts: No atrial level shunt detected by color flow Doppler.  LEFT VENTRICLE PLAX 2D LVIDd:         4.70 cm  Diastology LVIDs:         3.20 cm  LV e' medial:    5.80 cm/s LV PW:         1.00 cm  LV E/e' medial:  18.6 LV IVS:        1.00 cm  LV e' lateral:   5.63 cm/s LVOT diam:     2.10 cm  LV E/e' lateral: 19.2 LV SV:         68 LV SV Index:   36 LVOT Area:     3.46 cm  RIGHT VENTRICLE RV S prime:     8.67 cm/s TAPSE (M-mode): 1.5 cm LEFT ATRIUM             Index       RIGHT ATRIUM           Index LA diam:        4.40 cm 2.35 cm/m  RA Area:     20.00 cm LA Vol (A2C):   51.3 ml 27.39 ml/m RA Volume:   58.20 ml  31.08 ml/m LA Vol (A4C):   60.0 ml 32.04 ml/m LA Biplane Vol: 58.2 ml 31.08 ml/m  AORTIC VALVE LVOT Vmax:   79.40 cm/s LVOT Vmean:  56.433 cm/s LVOT VTI:    0.195 m  AORTA Ao Root diam: 3.50 cm Ao Asc diam:  4.00 cm MITRAL VALVE                TRICUSPID VALVE MV Area (PHT): 2.73 cm     TR Peak grad:   26.0 mmHg MV Decel Time: 278 msec     TR Vmax:  255.00 cm/s MV E velocity: 108.00 cm/s MV A velocity: 36.80 cm/s   SHUNTS MV E/A ratio:  2.93         Systemic VTI:  0.20 m                             Systemic Diam: 2.10 cm [name] MD Electronically signed by [name] MD Signature Date/Time: 04/22/2021/1:04:09 PM    Final     Cardiac Studies   TTE 04/23/2021  1. Left ventricular ejection fraction, by estimation, is 60 to 65%. The  left ventricle has normal function. The left ventricle has no regional  wall motion abnormalities. Left ventricular diastolic parameters are  consistent with Grade II diastolic  dysfunction (pseudonormalization). Elevated left ventricular end-diastolic  pressure. The E/e' is 67.   2. Right ventricular systolic function is mildly reduced. The right  ventricular size is normal. There is mildly elevated pulmonary artery  systolic pressure. The estimated right  ventricular systolic pressure is  83.6 mmHg.   3. The mitral valve is grossly normal. Trivial mitral valve  regurgitation.   4. The aortic valve is tricuspid. Aortic valve regurgitation is not  visualized.   5. Aortic dilatation noted. There is mild dilatation of the ascending  aorta, measuring 40 mm.   6. The inferior vena cava is dilated in size with <50% respiratory  variability, suggesting right atrial pressure of 15 mmHg.   LHC 04/20/2021  1.  Significant heavily calcified distal left main disease with significant calcified disease affecting the proximal LAD, large first diagonal, ostial ramus and left circumflex.  Occluded OM 3 with collaterals.  The coronary arteries are codominant with no obstructive disease affecting the right coronary artery. 2.  Normal LV systolic function and left ventricular end-diastolic pressure.  Patient Profile  Tiffany Talarico is a 80 y.o. male with history of paroxysmal atrial fibrillation, tobacco abuse, COPD, PAD, prior lung cancer, diastolic heart failure, diabetes who was admitted on 04/20/2021 after left heart catheterization demonstrated significant left main disease as well as disease in the circumflex and LAD and diagonal branches.  Course was complicated by A. fib with RVR.  He is converted back to sinus rhythm.  He is awaiting CABG.  Assessment & Plan   Unstable angina -Admitted after outpatient cardiac cath demonstrated 70% left main disease, 80% proximal LAD disease, 70% diagonal lesion, 70% ostial circumflex lesion, minimal disease in the RCA -He has no percutaneous options.  His only option is bypass surgery.  I explained that to him today.  He will see surgery and is willing to proceed. -Continue aspirin.  He is on a beta-blocker.  No symptoms of angina. -Course will be held up by incidentally COVID positivity.  He has no symptoms from this. -Echocardiogram shows normal LV function -Statin on hold as he is on treatment for COVID -Restart  statin after he has completed 5 days of therapy -He will remain on a heparin drip for his A. fib as well as unstable angina.  2.  Incidental COVID positivity, without symptoms -Continue treatment for 5 days.  Per pharmacy.  Statin on hold per this.  3.  Paroxysmal AF -No history of this.  Had A. fib with RVR in the Cath Lab.  Has converted back to sinus rhythm.  Continue beta-blocker.  Maintaining sinus rhythm.  Echocardiogram normal.  4.  Hyperlipidemia -Statin on hold as above  5.  Hypokalemia -Replace potassium  6.  COPD/tobacco abuse -Appears compensated.  Moving air well.  7.  Diabetes -A1c 6.5. -Likely will just need metformin at discharge.  No need for sign scale insulin at this time.  FEN -No intravenous fluids -DVT PPx: Heparin drip -Diet: Heart healthy -Code: Full  For questions or updates, please contact [place] Please consult [email or web address] for contact info under   Time Spent with Patient: I have spent a total of 25 minutes with patient reviewing hospital notes, telemetry, EKGs, labs and examining the patient as well as establishing an assessment and plan that was discussed with the patient.  > 50% of time was spent in direct patient care.    Signed, Addison Naegeli. Audie Box, MD, [place]  04/23/2021 9:30 AM

## 2021-04-23 NOTE — Progress Notes (Addendum)
ID consult called per Dr. Kathalene Frames request.  Relayed update from nurse about intermittent PAF/flutter with HR 120s-140s - has chest tightness/dizziness when this occurs, resolves when he goes back to SB 50-60s. Dr. Audie Box recommends d/c metoprolol and starting amiodarone 200mg  BID (iodine/iohexol allergy reviewed - recommends to trial as allergy was more likely contrast related than iodine itself). However, ID identified drug interaction with Paxlovid so Dr. Audie Box is OK with discontinuing amiodarone and restarting metoprolol. Will make next dose in AM since got amiodarone today.  Salome Cozby PA-C

## 2021-04-23 NOTE — Progress Notes (Signed)
[place] for IV Heparin Indication:  CAD awaiting CABG consult  Allergies  Allergen Reactions   Iodine Other (See Comments)    neck swells   Iohexol Swelling    Neck and gland swelling per patient.    Patient Measurements: Height: 5\' 9"  (175.3 cm) Weight: 72 kg (158 lb 11.7 oz) IBW/kg (Calculated) : 70.7 kg Heparin Dosing Weight: 72 kg  Vital Signs: Temp: 97.8 F (36.6 C) (06/20 0743) Temp Source: Oral (06/20 0743) BP: 116/78 (06/20 0743) Pulse Rate: 53 (06/20 0743)  Labs: Recent Labs    04/21/21 0200 04/21/21 0225 04/21/21 1149 04/21/21 2124 04/22/21 0250  HGB  --  12.1*  --   --  12.7*  HCT  --  37.1*  --   --  38.6*  PLT  --  162  --   --  165  APTT 65*  --   --   --   --   HEPARINUNFRC 0.22*  --  0.42 0.53 0.53  CREATININE  --   --   --   --  0.86     Estimated Creatinine Clearance: 69.6 mL/min (by C-G formula based on SCr of 0.86 mg/dL).   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   COPD (chronic obstructive pulmonary disease) ([place])    Diabetes type 2, controlled ([place]) 07/31/2017   Dysrhythmia    a fib   GERD (gastroesophageal reflux disease)    Hematuria    refuses work up or referral - understands risks of morbidity / mortality - 11/2008, 12/2008   History of hiatal hernia    History of kidney stones    Hyperlipemia    Meningioma ([place]) 10/25/2013   Follows with Dr. [name].    Peripheral vascular disease ([place])    Abdominal Aortic Aneursym   Pneumonia    as a child   Radiation 09/18/15-10/25/15   left lower lobe 70.2 Gy   Seizures ([place]) 02/18/2020   Tobacco abuse     Medications:  Infusions:   sodium chloride     heparin 1,100 Units/hr (04/22/21 1709)    Assessment: 80 yo male is s/p cardiac cath on 6/17 which showed 3vCAD with  LM involvement. The patient is now being evaluated for CABG. PTA the patient is on rivaroxaban (last dose 6/15 at noon). Pharmacy is consulted to dose heparin.  Heparin level is therapeutic at 0.41 while running at 1100 units/hr. The heparin levels and aPTT are correlated, therefore, will monitor using heparin levels only. Per the RN, there have been no issues with the infusion and the patient is without signs or symptoms of bleeding. Hgb 14s, plt wnl.  Goal of Therapy:  Heparin level 0.3-0.7 units/ml Monitor platelets by anticoagulation protocol: Yes   Plan:  -Continue heparin IV at 1100 units/hr -Obtain a daily heparin level and CBC -Monitor for signs and symptoms of bleeding -Follow CABG plans  Erin Hearing PharmD., BCPS Clinical Pharmacist 04/23/2021 1:37 PM   Please check [email or web address] for unit-specific pharmacy phone numbers.

## 2021-04-24 ENCOUNTER — Other Ambulatory Visit (HOSPITAL_COMMUNITY): Payer: Medicare Other

## 2021-04-24 ENCOUNTER — Telehealth: Payer: Self-pay | Admitting: Family

## 2021-04-24 DIAGNOSIS — I200 Unstable angina: Secondary | ICD-10-CM | POA: Diagnosis not present

## 2021-04-24 LAB — CBC
HCT: 38.6 % — ABNORMAL LOW (ref 39.0–52.0)
Hemoglobin: 12.7 g/dL — ABNORMAL LOW (ref 13.0–17.0)
MCH: 30.7 pg (ref 26.0–34.0)
MCHC: 32.9 g/dL (ref 30.0–36.0)
MCV: 93.2 fL (ref 80.0–100.0)
Platelets: 190 10*3/uL (ref 150–400)
RBC: 4.14 MIL/uL — ABNORMAL LOW (ref 4.22–5.81)
RDW: 14.2 % (ref 11.5–15.5)
WBC: 7.4 10*3/uL (ref 4.0–10.5)
nRBC: 0 % (ref 0.0–0.2)

## 2021-04-24 LAB — HEPARIN LEVEL (UNFRACTIONATED): Heparin Unfractionated: 0.43 IU/mL (ref 0.30–0.70)

## 2021-04-24 NOTE — Telephone Encounter (Signed)
Pt called to make Melissa aware that he is in the hospital and will be having heart surgery on Friday. Pt states he had 3 blockages in his heart.

## 2021-04-24 NOTE — Progress Notes (Signed)
Cardiology Progress Note  Patient ID: Robert Bates MRN: [id number] DOB: 1941/01/05 Date of Encounter: 04/24/2021  Primary Cardiologist: Mertie Moores, MD  Subjective   Chief Complaint: None.  HPI: Brief A. fib overnight.  Still with sinus bradycardia.  Cannot be on high intensity statin or amiodarone as he is on treatment for COVID.  PCR still positive.  Still no symptoms.  ROS:  All other ROS reviewed and negative. Pertinent positives noted in the HPI.     Inpatient Medications  Scheduled Meds:  aspirin EC  81 mg Oral Once   metoprolol tartrate  12.5 mg Oral BID   mometasone-formoterol  2 puff Inhalation BID   multivitamin with minerals  1 tablet Oral Q1500   nirmatrelvir/ritonavir EUA  3 tablet Oral BID   pantoprazole  40 mg Oral Daily   sodium chloride flush  3 mL Intravenous Q12H   sodium chloride flush  3 mL Intravenous Q12H   tamsulosin  0.8 mg Oral Daily   Continuous Infusions:  sodium chloride     heparin 1,100 Units/hr (04/22/21 1709)   PRN Meds: sodium chloride, acetaminophen, albuterol, alum & mag hydroxide-simeth, nitroGLYCERIN, ondansetron (ZOFRAN) IV, sodium chloride flush   Vital Signs   Vitals:   04/23/21 0450 04/23/21 0743 04/23/21 2031 04/24/21 0713  BP: 107/68 116/78 118/75 129/79  Pulse: (!) 53 (!) 53 (!) 56 (!) 54  Resp: 16 16  18   Temp: 98 F (36.7 C) 97.8 F (36.6 C) 98.5 F (36.9 C) 98.1 F (36.7 C)  TempSrc: Oral Oral Oral Oral  SpO2: 98%  97% 99%  Weight:      Height:        Intake/Output Summary (Last 24 hours) at 04/24/2021 0857 Last data filed at 04/24/2021 0729 Gross per 24 hour  Intake 567.76 ml  Output 2100 ml  Net -1532.24 ml   Last 3 Weights 04/20/2021 04/20/2021 04/18/2021  Weight (lbs) 158 lb 11.7 oz 164 lb 164 lb  Weight (kg) 72 kg 74.39 kg 74.39 kg      Telemetry  Overnight telemetry shows brief atrial fibrillation last night.  Maintaining sinus rhythm this morning., which I personally reviewed.    Physical Exam    Vitals:   04/23/21 0450 04/23/21 0743 04/23/21 2031 04/24/21 0713  BP: 107/68 116/78 118/75 129/79  Pulse: (!) 53 (!) 53 (!) 56 (!) 54  Resp: 16 16  18   Temp: 98 F (36.7 C) 97.8 F (36.6 C) 98.5 F (36.9 C) 98.1 F (36.7 C)  TempSrc: Oral Oral Oral Oral  SpO2: 98%  97% 99%  Weight:      Height:        Intake/Output Summary (Last 24 hours) at 04/24/2021 0857 Last data filed at 04/24/2021 0729 Gross per 24 hour  Intake 567.76 ml  Output 2100 ml  Net -1532.24 ml    Last 3 Weights 04/20/2021 04/20/2021 04/18/2021  Weight (lbs) 158 lb 11.7 oz 164 lb 164 lb  Weight (kg) 72 kg 74.39 kg 74.39 kg    Body mass index is 23.44 kg/m.  General: Well nourished, well developed, in no acute distress Head: Atraumatic, normal size  Eyes: PEERLA, EOMI  Neck: Supple, no JVD Endocrine: No thryomegaly Cardiac: Normal S1, S2; RRR; no murmurs, rubs, or gallops Lungs: Clear to auscultation bilaterally, no wheezing, rhonchi or rales  Abd: Soft, nontender, no hepatomegaly  Ext: No edema, pulses 2+ Musculoskeletal: No deformities, BUE and BLE strength normal and equal Skin: Warm and dry, no rashes  Neuro: Alert and oriented to person, place, time, and situation, CNII-XII grossly intact, no focal deficits  Psych: Normal mood and affect   Labs  High Sensitivity Troponin:  No results for input(s): TROPONINIHS in the last 720 hours.   Cardiac EnzymesNo results for input(s): TROPONINI in the last 168 hours. No results for input(s): TROPIPOC in the last 168 hours.  Chemistry Recent Labs  Lab 04/20/21 0747 04/22/21 0250  NA 139 139  K 3.9 3.4*  CL 105 106  CO2 25 28  GLUCOSE 181* 84  BUN 13 15  CREATININE 0.75 0.86  CALCIUM 8.7* 8.4*  GFRNONAA >60 >60  ANIONGAP 9 5    Hematology Recent Labs  Lab 04/22/21 0250 04/23/21 1200 04/24/21 0144  WBC 7.4 8.2 7.4  RBC 4.13* 4.68 4.14*  HGB 12.7* 14.2 12.7*  HCT 38.6* 44.3 38.6*  MCV 93.5 94.7 93.2  MCH 30.8 30.3 30.7  MCHC 32.9 32.1 32.9   RDW 14.5 14.5 14.2  PLT 165 179 190   BNPNo results for input(s): BNP, PROBNP in the last 168 hours.  DDimer No results for input(s): DDIMER in the last 168 hours.   Radiology  ECHOCARDIOGRAM COMPLETE  Result Date: 04/22/2021    ECHOCARDIOGRAM REPORT   Patient Name:   Robert Bates Date of Exam: 04/22/2021 Medical Rec #:  [id number]     Height:       69.0 in Accession #:    [id number]    Weight:       158.7 lb Date of Birth:  [birth year]     BSA:          1.873 m Patient Age:    80 years      BP:           109/75 mmHg Patient Gender: M             HR:           53 bpm. Exam Location:  Inpatient Procedure: 2D Echo, Color Doppler, Cardiac Doppler and Intracardiac            Opacification Agent Indications:    I48.91* Unspeicified atrial fibrillation  History:        Patient has prior history of Echocardiogram examinations, most                 recent 07/12/2017. COPD; Risk Factors:Diabetes, Dyslipidemia and                 Current Smoker.  Sonographer:    Bernadene Person RDCS Referring Phys: [place]  1. Left ventricular ejection fraction, by estimation, is 60 to 65%. The left ventricle has normal function. The left ventricle has no regional wall motion abnormalities. Left ventricular diastolic parameters are consistent with Grade II diastolic dysfunction (pseudonormalization). Elevated left ventricular end-diastolic pressure. The E/e' is 81.  2. Right ventricular systolic function is mildly reduced. The right ventricular size is normal. There is mildly elevated pulmonary artery systolic pressure. The estimated right ventricular systolic pressure is 40.9 mmHg.  3. The mitral valve is grossly normal. Trivial mitral valve regurgitation.  4. The aortic valve is tricuspid. Aortic valve regurgitation is not visualized.  5. Aortic dilatation noted. There is mild dilatation of the ascending aorta, measuring 40 mm.  6. The inferior vena cava is dilated in size with <50% respiratory variability,  suggesting right atrial pressure of 15 mmHg. FINDINGS  Left Ventricle: Left ventricular ejection fraction, by estimation, is 60 to 65%. The  left ventricle has normal function. The left ventricle has no regional wall motion abnormalities. Definity contrast agent was given IV to delineate the left ventricular  endocardial borders. The left ventricular internal cavity size was normal in size. There is no left ventricular hypertrophy. Left ventricular diastolic parameters are consistent with Grade II diastolic dysfunction (pseudonormalization). Elevated left ventricular end-diastolic pressure. The E/e' is 16. Right Ventricle: The right ventricular size is normal. No increase in right ventricular wall thickness. Right ventricular systolic function is mildly reduced. There is mildly elevated pulmonary artery systolic pressure. The tricuspid regurgitant velocity  is 2.55 m/s, and with an assumed right atrial pressure of 15 mmHg, the estimated right ventricular systolic pressure is 67.6 mmHg. Left Atrium: Left atrial size was normal in size. Right Atrium: Right atrial size was normal in size. Pericardium: There is no evidence of pericardial effusion. Mitral Valve: The mitral valve is grossly normal. Trivial mitral valve regurgitation. Tricuspid Valve: The tricuspid valve is grossly normal. Tricuspid valve regurgitation is trivial. Aortic Valve: The aortic valve is tricuspid. Aortic valve regurgitation is not visualized. Pulmonic Valve: The pulmonic valve was normal in structure. Pulmonic valve regurgitation is not visualized. Aorta: Aortic dilatation noted. There is mild dilatation of the ascending aorta, measuring 40 mm. Venous: The inferior vena cava is dilated in size with less than 50% respiratory variability, suggesting right atrial pressure of 15 mmHg. IAS/Shunts: No atrial level shunt detected by color flow Doppler.  LEFT VENTRICLE PLAX 2D LVIDd:         4.70 cm  Diastology LVIDs:         3.20 cm  LV e' medial:     5.80 cm/s LV PW:         1.00 cm  LV E/e' medial:  18.6 LV IVS:        1.00 cm  LV e' lateral:   5.63 cm/s LVOT diam:     2.10 cm  LV E/e' lateral: 19.2 LV SV:         68 LV SV Index:   36 LVOT Area:     3.46 cm  RIGHT VENTRICLE RV S prime:     8.67 cm/s TAPSE (M-mode): 1.5 cm LEFT ATRIUM             Index       RIGHT ATRIUM           Index LA diam:        4.40 cm 2.35 cm/m  RA Area:     20.00 cm LA Vol (A2C):   51.3 ml 27.39 ml/m RA Volume:   58.20 ml  31.08 ml/m LA Vol (A4C):   60.0 ml 32.04 ml/m LA Biplane Vol: 58.2 ml 31.08 ml/m  AORTIC VALVE LVOT Vmax:   79.40 cm/s LVOT Vmean:  56.433 cm/s LVOT VTI:    0.195 m  AORTA Ao Root diam: 3.50 cm Ao Asc diam:  4.00 cm MITRAL VALVE                TRICUSPID VALVE MV Area (PHT): 2.73 cm     TR Peak grad:   26.0 mmHg MV Decel Time: 278 msec     TR Vmax:        255.00 cm/s MV E velocity: 108.00 cm/s MV A velocity: 36.80 cm/s   SHUNTS MV E/A ratio:  2.93         Systemic VTI:  0.20 m  Systemic Diam: 2.10 cm [name] MD Electronically signed by [name] MD Signature Date/Time: 04/22/2021/1:04:09 PM    Final     Cardiac Studies  LHC 04/20/2021  1.  Significant heavily calcified distal left main disease with significant calcified disease affecting the proximal LAD, large first diagonal, ostial ramus and left circumflex.  Occluded OM 3 with collaterals.  The coronary arteries are codominant with no obstructive disease affecting the right coronary artery. 2.  Normal LV systolic function and left ventricular end-diastolic pressure.   Recommendations: The patient was in sinus rhythm on presentation but went into A. fib with RVR during catheterization.  He was given 5 mg of IV metoprolol but was still tachycardic.  Due to this and given his significant coronary artery disease, recommend admission to the hospital. Start heparin drip 6 hours after sheath pull. Recommend rate control for atrial fibrillation.  I started oral  metoprolol and if rate is still not controlled, diltiazem drip or amiodarone drip can be considered. I requested an echocardiogram. I will consult CT surgery for CABG evaluation.  TTE 04/22/2021   1. Left ventricular ejection fraction, by estimation, is 60 to 65%. The  left ventricle has normal function. The left ventricle has no regional  wall motion abnormalities. Left ventricular diastolic parameters are  consistent with Grade II diastolic  dysfunction (pseudonormalization). Elevated left ventricular end-diastolic  pressure. The E/e' is 27.   2. Right ventricular systolic function is mildly reduced. The right  ventricular size is normal. There is mildly elevated pulmonary artery  systolic pressure. The estimated right ventricular systolic pressure is  29.7 mmHg.   3. The mitral valve is grossly normal. Trivial mitral valve  regurgitation.   4. The aortic valve is tricuspid. Aortic valve regurgitation is not  visualized.   5. Aortic dilatation noted. There is mild dilatation of the ascending  aorta, measuring 40 mm.   6. The inferior vena cava is dilated in size with <50% respiratory  variability, suggesting right atrial pressure of 15 mmHg.   Patient Profile  Robert Bates is a 80 y.o. male with history of paroxysmal atrial fibrillation, tobacco abuse, COPD, PAD, prior lung cancer, diastolic heart failure, diabetes who was admitted on 04/20/2021 after left heart catheterization demonstrated significant left main disease as well as disease in the circumflex and LAD and diagonal branches.  Course was complicated by A. fib with RVR.  He is converted back to sinus rhythm.  He is awaiting CABG.  Assessment & Plan   Unstable angina/three-vessel CAD including left main disease -No further chest pain.  Remains on heparin drip. -Cannot be on statin or amiodarone due to treatment for COVID-19. -Echo shows normal LV function. -Unfortunately he is COVID-positive.  This will delay his surgery.   I did discuss his case with CT surgery.  Given his lack of symptoms and severity of CAD they feel that surgery is indicated.  ID has recommended to wait several weeks if possible however this is not the case.  When he goes into A. fib with RVR he does have chest pain.  This is indicative of his severe CAD. -Plan tentatively for CABG later this week.  He will complete his course for COVID 19 treatment. -Continue beta-blocker. -Restart statin when able.  2.  COVID positivity -PCR is positive as well.  He has asymptomatic COVID-19 infection.  He is on treatment for this for 5 days. -Statin on hold.  Amiodarone hold until he completes course.  There is significant drug  interaction.  3.  Paroxysmal atrial fibrillation/sinus bradycardia -On heparin drip for unstable angina. -Plans for CABG.  Would recommend left atrial appendage clipping at the time of surgery. -He is in and out of A. fib.  He cannot be on amiodarone as this interacts with his COVID-19 medication. -For now we will continue his metoprolol tartrate 12.5 twice daily.  This seems to be maintaining sinus rhythm.  He does have paroxysms of A. fib.  He did receive 1 dose of amnio yesterday but this will need to be held. -He is without symptoms of a sinus bradycardia.  Would allow this.  This will get lower when he is sleeping.  No high grade pauses or conduction disease.  Seems to be doing well.  Hopefully we can get him on amiodarone once he completes his COVID-19 treatment.  4.  Hyperlipidemia -Restart statin agent after he completes treatment for COVID  5.  COPD/tobacco abuse -Cessation recommended.  Hopefully he will quit.  6.  Diabetes -A1c 6.5.  Likely will just need metformin at discharge.  FEN -No intravenous fluids -Diet: Heart healthy -Code: Full -DVT Ppx: Heparin drip  For questions or updates, please contact [facility] Please consult [email or web address] for contact info under   Time Spent with Patient: I have spent a  total of 25 minutes with patient reviewing hospital notes, telemetry, EKGs, labs and examining the patient as well as establishing an assessment and plan that was discussed with the patient.  > 50% of time was spent in direct patient care.    Signed, Addison Naegeli. Audie Box, MD, [place]  04/24/2021 8:57 AM

## 2021-04-24 NOTE — Progress Notes (Signed)
Called pt and discussed IS, sternal precautions, mobility post op and d/c planning. Pt receptive, many questions. His wife will be with him at d/c although he is concerned for her covid status (sts she is supposed to be tested). Will deliver materials to pts room including IS. Encouraged pt to be up in room if deemed safe by RN.  [place] CES, ACSM 1:45 PM 04/24/2021

## 2021-04-24 NOTE — Progress Notes (Signed)
[place] for IV Heparin Indication: Awaiting CABG  Allergies  Allergen Reactions   Iodine Other (See Comments)    neck swells   Iohexol Swelling    Neck and gland swelling per patient.    Patient Measurements: Height: 5\' 9"  (175.3 cm) Weight: 72 kg (158 lb 11.7 oz) IBW/kg (Calculated) : 70.7 kg Heparin Dosing Weight: 72 kg  Vital Signs: Temp: 98.1 F (36.7 C) (06/21 0713) Temp Source: Oral (06/21 0713) BP: 123/90 (06/21 0901) Pulse Rate: 51 (06/21 0901)  Labs: Recent Labs    04/22/21 0250 04/23/21 1200 04/24/21 0144  HGB 12.7* 14.2 12.7*  HCT 38.6* 44.3 38.6*  PLT 165 179 190  HEPARINUNFRC 0.53 0.41 0.43  CREATININE 0.86  --   --      Estimated Creatinine Clearance: 69.6 mL/min (by C-G formula based on SCr of 0.86 mg/dL).   Assessment: 80 yo male is s/p cardiac cath on 6/17 which showed 3vCAD with LM involvement. The patient is now being evaluated for CABG. PTA the patient is on rivaroxaban (last dose 6/15 at noon). Pharmacy is consulted to dose heparin.  Heparin level therapeutic   Goal of Therapy:  Heparin level 0.3-0.7 units/ml Monitor platelets by anticoagulation protocol: Yes   Plan:  -Continue heparin IV at 1100 units/hr -Obtain a daily heparin level and CBC -Monitor for signs and symptoms of bleeding -Follow CABG plans - > Friday?  Thank you Anette Guarneri, PharmD 04/24/2021 12:39 PM   Please check [email or web address] for unit-specific pharmacy phone numbers.

## 2021-04-25 ENCOUNTER — Other Ambulatory Visit (HOSPITAL_COMMUNITY): Payer: Medicare Other

## 2021-04-25 ENCOUNTER — Inpatient Hospital Stay (HOSPITAL_COMMUNITY): Payer: Medicare Other

## 2021-04-25 DIAGNOSIS — Z01810 Encounter for preprocedural cardiovascular examination: Secondary | ICD-10-CM

## 2021-04-25 LAB — CBC
HCT: 40.5 % (ref 39.0–52.0)
Hemoglobin: 13.1 g/dL (ref 13.0–17.0)
MCH: 30.2 pg (ref 26.0–34.0)
MCHC: 32.3 g/dL (ref 30.0–36.0)
MCV: 93.3 fL (ref 80.0–100.0)
Platelets: 204 10*3/uL (ref 150–400)
RBC: 4.34 MIL/uL (ref 4.22–5.81)
RDW: 14.2 % (ref 11.5–15.5)
WBC: 7.4 10*3/uL (ref 4.0–10.5)
nRBC: 0 % (ref 0.0–0.2)

## 2021-04-25 LAB — HEPARIN LEVEL (UNFRACTIONATED)
Heparin Unfractionated: 0.21 IU/mL — ABNORMAL LOW (ref 0.30–0.70)
Heparin Unfractionated: 0.47 IU/mL (ref 0.30–0.70)

## 2021-04-25 MED ORDER — METOPROLOL TARTRATE 25 MG PO TABS
25.0000 mg | ORAL_TABLET | Freq: Once | ORAL | Status: DC
  Filled 2021-04-25: qty 1

## 2021-04-25 NOTE — Progress Notes (Signed)
[place] for IV Heparin Indication: Awaiting CABG  Allergies  Allergen Reactions   Iodine Other (See Comments)    neck swells   Iohexol Swelling    Neck and gland swelling per patient.    Patient Measurements: Height: 5\' 9"  (175.3 cm) Weight: 72 kg (158 lb 11.7 oz) IBW/kg (Calculated) : 70.7 kg Heparin Dosing Weight: 72 kg  Vital Signs: Temp: 98.7 F (37.1 C) (06/22 1236) Temp Source: Oral (06/22 1236) BP: 107/62 (06/22 1236) Pulse Rate: 117 (06/22 1236)  Labs: Recent Labs    04/23/21 1200 04/24/21 0144 04/25/21 0301 04/25/21 1151  HGB 14.2 12.7* 13.1  --   HCT 44.3 38.6* 40.5  --   PLT 179 190 204  --   HEPARINUNFRC 0.41 0.43 0.21* 0.47    Estimated Creatinine Clearance: 69.6 mL/min (by C-G formula based on SCr of 0.86 mg/dL).   Assessment: 80 yo male is s/p cardiac cath on 6/17 which showed 3vCAD with LM involvement. The patient is now being evaluated for CABG. PTA the patient is on rivaroxaban (last dose 6/15 at noon). Pharmacy is consulted to dose heparin.  Heparin level therapeutic this morning. No issues noted.   Goal of Therapy:  Heparin level 0.3-0.7 units/ml Monitor platelets by anticoagulation protocol: Yes   Plan:  -Continue heparin IV at 1200 units/hr -Obtain a daily heparin level and CBC -Monitor for signs and symptoms of bleeding -Follow CABG plans - > Friday  Thank you Erin Hearing PharmD., BCPS Clinical Pharmacist 04/25/2021 1:09 PM    Please check [email or web address] for unit-specific pharmacy phone numbers.

## 2021-04-25 NOTE — Progress Notes (Signed)
Pre-CABG study completed.   Please see CV Proc for preliminary results.   Darlin Coco, RDMS, RVT

## 2021-04-25 NOTE — Progress Notes (Signed)
Pt has went into Afib. HR 120's-130s. On call provider paged.

## 2021-04-25 NOTE — Progress Notes (Signed)
Cardiology Progress Note  Patient ID: Robert Bates MRN: [id number] DOB: 04/21/1941 Date of Encounter: 04/25/2021  Primary Cardiologist: Mertie Moores, MD  Subjective   Chief Complaint: None.  HPI: Awaiting CABG later this week.  Brief A. fib this morning.  No complaints.  ROS:  All other ROS reviewed and negative. Pertinent positives noted in the HPI.     Inpatient Medications  Scheduled Meds:  aspirin EC  81 mg Oral Once   metoprolol tartrate  12.5 mg Oral BID   mometasone-formoterol  2 puff Inhalation BID   multivitamin with minerals  1 tablet Oral Q1500   nirmatrelvir/ritonavir EUA  3 tablet Oral BID   pantoprazole  40 mg Oral Daily   sodium chloride flush  3 mL Intravenous Q12H   sodium chloride flush  3 mL Intravenous Q12H   tamsulosin  0.8 mg Oral Daily   Continuous Infusions:  sodium chloride     heparin 1,200 Units/hr (04/25/21 0512)   PRN Meds: sodium chloride, acetaminophen, albuterol, alum & mag hydroxide-simeth, nitroGLYCERIN, ondansetron (ZOFRAN) IV, sodium chloride flush   Vital Signs   Vitals:   04/24/21 1533 04/24/21 1855 04/24/21 2154 04/24/21 2200  BP: 102/62 (!) 114/95 126/67 126/67  Pulse:   63 63  Resp:      Temp:      TempSrc:      SpO2: 97% 98%  96%  Weight:      Height:        Intake/Output Summary (Last 24 hours) at 04/25/2021 0826 Last data filed at 04/24/2021 1533 Gross per 24 hour  Intake --  Output 800 ml  Net -800 ml   Last 3 Weights 04/20/2021 04/20/2021 04/18/2021  Weight (lbs) 158 lb 11.7 oz 164 lb 164 lb  Weight (kg) 72 kg 74.39 kg 74.39 kg      Telemetry  Overnight telemetry shows brief atrial fibrillation this morning.  Back in sinus rhythm with sinus bradycardia, which I personally reviewed.   Physical Exam   Vitals:   04/24/21 1533 04/24/21 1855 04/24/21 2154 04/24/21 2200  BP: 102/62 (!) 114/95 126/67 126/67  Pulse:   63 63  Resp:      Temp:      TempSrc:      SpO2: 97% 98%  96%  Weight:      Height:         Intake/Output Summary (Last 24 hours) at 04/25/2021 0826 Last data filed at 04/24/2021 1533 Gross per 24 hour  Intake --  Output 800 ml  Net -800 ml    Last 3 Weights 04/20/2021 04/20/2021 04/18/2021  Weight (lbs) 158 lb 11.7 oz 164 lb 164 lb  Weight (kg) 72 kg 74.39 kg 74.39 kg    Body mass index is 23.44 kg/m.   General: Well nourished, well developed, in no acute distress Head: Atraumatic, normal size  Eyes: PEERLA, EOMI  Neck: Supple, no JVD Endocrine: No thryomegaly Cardiac: Normal S1, S2; RRR; no murmurs, rubs, or gallops Lungs: Clear to auscultation bilaterally, no wheezing, rhonchi or rales  Abd: Soft, nontender, no hepatomegaly  Ext: No edema, pulses 2+ Musculoskeletal: No deformities, BUE and BLE strength normal and equal Skin: Warm and dry, no rashes   Neuro: Alert and oriented to person, place, time, and situation, CNII-XII grossly intact, no focal deficits  Psych: Normal mood and affect   Labs  High Sensitivity Troponin:  No results for input(s): TROPONINIHS in the last 720 hours.   Cardiac EnzymesNo results for input(s): TROPONINI in  the last 168 hours. No results for input(s): TROPIPOC in the last 168 hours.  Chemistry Recent Labs  Lab 04/20/21 0747 04/22/21 0250  NA 139 139  K 3.9 3.4*  CL 105 106  CO2 25 28  GLUCOSE 181* 84  BUN 13 15  CREATININE 0.75 0.86  CALCIUM 8.7* 8.4*  GFRNONAA >60 >60  ANIONGAP 9 5    Hematology Recent Labs  Lab 04/23/21 1200 04/24/21 0144 04/25/21 0301  WBC 8.2 7.4 7.4  RBC 4.68 4.14* 4.34  HGB 14.2 12.7* 13.1  HCT 44.3 38.6* 40.5  MCV 94.7 93.2 93.3  MCH 30.3 30.7 30.2  MCHC 32.1 32.9 32.3  RDW 14.5 14.2 14.2  PLT 179 190 204   BNPNo results for input(s): BNP, PROBNP in the last 168 hours.  DDimer No results for input(s): DDIMER in the last 168 hours.   Radiology  No results found.  Cardiac Studies  TTE 04/22/2021  1. Left ventricular ejection fraction, by estimation, is 60 to 65%. The  left ventricle has  normal function. The left ventricle has no regional  wall motion abnormalities. Left ventricular diastolic parameters are  consistent with Grade II diastolic  dysfunction (pseudonormalization). Elevated left ventricular end-diastolic  pressure. The E/e' is 62.   2. Right ventricular systolic function is mildly reduced. The right  ventricular size is normal. There is mildly elevated pulmonary artery  systolic pressure. The estimated right ventricular systolic pressure is  70.3 mmHg.   3. The mitral valve is grossly normal. Trivial mitral valve  regurgitation.   4. The aortic valve is tricuspid. Aortic valve regurgitation is not  visualized.   5. Aortic dilatation noted. There is mild dilatation of the ascending  aorta, measuring 40 mm.   6. The inferior vena cava is dilated in size with <50% respiratory  variability, suggesting right atrial pressure of 15 mmHg.   LHC 04/20/2021  Mid LM to Dist LM lesion is 70% stenosed. Ost Cx to Prox Cx lesion is 70% stenosed. Mid Cx lesion is 90% stenosed. 3rd Mrg lesion is 100% stenosed. Ost LAD to Prox LAD lesion is 80% stenosed. 1st Diag lesion is 70% stenosed. Ramus lesion is 60% stenosed.   1.  Significant heavily calcified distal left main disease with significant calcified disease affecting the proximal LAD, large first diagonal, ostial ramus and left circumflex.  Occluded OM 3 with collaterals.  The coronary arteries are codominant with no obstructive disease affecting the right coronary artery. 2.  Normal LV systolic function and left ventricular end-diastolic pressure.  Patient Profile  Robert Bates is a 80 y.o. male with history of paroxysmal atrial fibrillation, tobacco abuse, COPD, PAD, prior lung cancer, diastolic heart failure, diabetes who was admitted on 04/20/2021 after left heart catheterization demonstrated significant left main disease as well as disease in the circumflex and LAD and diagonal branches.  Course was complicated by  A. fib with RVR.  He is converted back to sinus rhythm.  He is awaiting CABG.  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 fib.  He is not on a statin or amiodarone due to his COVID-19 positivity and treatment. -No further chest pain symptoms. -Continue aspirin heparin drip -Continue beta-blocker -Restart statin likely tomorrow once he is off COVID-19 treatment  2.  COVID-19 positivity without symptoms -On treatment.  Today is day 5. -We cannot put him on amiodarone or a statin agent given  the interaction.  We will start these tomorrow. -Plans for surgery this Friday.  No symptoms from this.  3.  Paroxysmal atrial fibrillation/sinus bradycardia -In and out of A. fib. -Cannot be on amnio due to drug interaction for COVID-19 treatment -Would request left atrial appendage clipping at the time of surgery -We will likely start him on amiodarone tomorrow to help maintain sinus rhythm through surgery. -Continue metoprolol tartrate 12.5 twice daily.  He does have sinus bradycardia but no symptoms from this.  This is helping him maintain sinus rhythm.  4.  Hyperlipidemia -Restart statin tomorrow.  Cannot be on 1 due to treatment for COVID-19.  There is substantial drug interaction.  5.  COPD/tobacco abuse -Cessation recommended  6.  Diabetes -A1c 6.5 -Likely send him on metformin  FEN -No intravenous fluids -Diet: Heart healthy -DVT PPx: Heparin drip -Code: Full  For questions or updates, please contact [facility] Please consult [email or web address] for contact info under   Time Spent with Patient: I have spent a total of 25 minutes with patient reviewing hospital notes, telemetry, EKGs, labs and examining the patient as well as establishing an assessment and plan that was discussed with the patient.  > 50% of time was spent in direct patient care.    Signed, Addison Naegeli.  Audie Box, MD, [place]  04/25/2021 8:26 AM

## 2021-04-25 NOTE — Progress Notes (Signed)
   04/25/21 1236  Assess: MEWS Score  Temp 98.7 F (37.1 C)  BP 107/62  Pulse Rate (!) 117  ECG Heart Rate (!) 122  Resp 16  Level of Consciousness Alert  SpO2 93 %  O2 Device Room Air  Assess: MEWS Score  MEWS Temp 0  MEWS Systolic 0  MEWS Pulse 2  MEWS RR 0  MEWS LOC 0  MEWS Score 2  MEWS Score Color Yellow  Assess: if the MEWS score is Yellow or Red  Were vital signs taken at a resting state? Yes  Focused Assessment No change from prior assessment  Early Detection of Sepsis Score *See Row Information* Low  MEWS guidelines implemented *See Row Information* Yes  Treat  Pain Scale 0-10  Pain Score 0  Take Vital Signs  Increase Vital Sign Frequency  Yellow: Q 2hr X 2 then Q 4hr X 2, if remains yellow, continue Q 4hrs  Escalate  MEWS: Escalate Yellow: discuss with charge nurse/RN and consider discussing with provider and RRT  Notify: Charge Nurse/RN  Name of Charge Nurse/RN Notified Christy, RN  Date Charge Nurse/RN Notified 04/25/21  Time Charge Nurse/RN Notified 1242  Notify: Provider  Provider Name/Title Dorene Ar, NP  Date Provider Notified 04/25/21  Time Provider Notified 1243  Notification Type Page  Notification Reason Other (Comment) (Pt converted to Afib)  Provider response No new orders  Document  Patient Outcome Other (Comment) (Stable at this time)  Progress note created (see row info) Yes

## 2021-04-25 NOTE — Progress Notes (Signed)
ANTICOAGULATION CONSULT NOTE - Follow Up Consult  Pharmacy Consult for heparin Indication:  CAD awaiting possible CABG   Labs: Recent Labs    04/23/21 1200 04/24/21 0144 04/25/21 0301  HGB 14.2 12.7* 13.1  HCT 44.3 38.6* 40.5  PLT 179 190 204  HEPARINUNFRC 0.41 0.43 0.21*    Assessment: 79yo male subtherapeutic on heparin after several levels at goal; no gtt issues or signs of bleeding per RN.  Goal of Therapy:  Heparin level 0.3-0.7 units/ml   Plan:  Will increase heparin gtt slightly to 1200 units/hr and check level in 8 hours.    Wynona Neat, PharmD, BCPS  04/25/2021,4:47 AM

## 2021-04-25 NOTE — Discharge Summary (Addendum)
Discharge Summary    Patient ID: Robert Bates MRN: [id number]; DOB: 12-Nov-1940  Admit date: 04/20/2021 Discharge date: 04/26/2021  PCP:  [name], NP   [facility] Providers Cardiologist:  Mertie Moores, MD   {   Discharge Diagnoses    Principal Problem:   Unstable angina [facility]) Active Problems:   Hyperlipidemia   HTN (hypertension)   COPD GOLD II    Diabetes type 2, controlled ([place])   PAF (paroxysmal atrial fibrillation) ([place])   COVID-19 virus infection    Diagnostic Studies/Procedures    Echo on 04/22/21:  1. Left ventricular ejection fraction, by estimation, is 60 to 65%. The  left ventricle has normal function. The left ventricle has no regional  wall motion abnormalities. Left ventricular diastolic parameters are  consistent with Grade II diastolic  dysfunction (pseudonormalization). Elevated left ventricular end-diastolic  pressure. The E/e' is 51.   2. Right ventricular systolic function is mildly reduced. The right  ventricular size is normal. There is mildly elevated pulmonary artery  systolic pressure. The estimated right ventricular systolic pressure is  77.8 mmHg.   3. The mitral valve is grossly normal. Trivial mitral valve  regurgitation.   4. The aortic valve is tricuspid. Aortic valve regurgitation is not  visualized.   5. Aortic dilatation noted. There is mild dilatation of the ascending  aorta, measuring 40 mm.   6. The inferior vena cava is dilated in size with <50% respiratory  variability, suggesting right atrial pressure of 15 mmHg.    Left heart cath on 04/20/21:  Mid LM to Dist LM lesion is 70% stenosed. Ost Cx to Prox Cx lesion is 70% stenosed. Mid Cx lesion is 90% stenosed. 3rd Mrg lesion is 100% stenosed. Ost LAD to Prox LAD lesion is 80% stenosed. 1st Diag lesion is 70% stenosed. Ramus lesion is 60% stenosed.   1.  Significant heavily calcified distal left main disease with significant calcified disease affecting the  proximal LAD, large first diagonal, ostial ramus and left circumflex.  Occluded OM 3 with collaterals.  The coronary arteries are codominant with no obstructive disease affecting the right coronary artery. 2.  Normal LV systolic function and left ventricular end-diastolic pressure.   Recommendations: The patient was in sinus rhythm on presentation but went into A. fib with RVR during catheterization.  He was given 5 mg of IV metoprolol but was still tachycardic.  Due to this and given his significant coronary artery disease, recommend admission to the hospital. Start heparin drip 6 hours after sheath pull. Recommend rate control for atrial fibrillation.  I started oral metoprolol and if rate is still not controlled, diltiazem drip or amiodarone drip can be considered. I requested an echocardiogram. I will consult CT surgery for CABG evaluation. _____________   History of Present Illness      Robert Bates is a 80 y.o. male with a hx of PAF on Xarelto 20mg , tobacco use with COPD and history of lung CA, peripheral vascular disease, HLD, chronic diastolic CHF, DM2, meningiomas.    He was evaluated on 03/12/2021 by Kathyrn Drown at which time he admitted to developing left sided chest pain with shortness of breath. 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 He tried massaging his chest to see if his symptoms would improve however they did not. His symptoms lasted approximately 9 hours in duration. Symptoms described as a pressure without  radiation to his arms, jaw or back. He has no prior hx of CAD however has CV risk factors and recent chest CT 06/20/20 showed aortic atherosclerosis of the great vessels including the coronary arteries with a calcified atherosclerotic plaque in the left main, left anterior descending, left circumflex and right coronary arteries.    CCTA performed 04/16/21 which showed possible flow  limiting lesions in the mLAD, mLCX and mid Diag vessels. Recommendations were to pursue LHC for further investigation. He was seen via virtual visit on 04/18/21 by Kathyrn Drown, reported he has not had any more significant episodes of chest pain however does report occasional "twinges" while working ooutside in the heat. He denied SOB, LE edema, palpitations, diaphroesis, N/V or suncope. He continued to work as a Administrator and was worried that he will not pass his transportation exam if he has a stent placed. Risks and benefits of LHC were discussed and he agreed with outpatient left cardiac cath planned on 04/20/21.    Hospital Course     Consultants: CT surgery  and ID    Unstable angina Three vessel CAD  - Admitted for outpatient cardiac cath demonstrated 70% left main disease, 80% proximal LAD disease, 70% diagonal lesion, 70% ostial circumflex lesion, minimal disease in the RCA - Echo from 04/22/21 showed EF 60-65% - He has no percutaneous options.  Clinically without angina symptoms.  - Evaluated by CT surgery, CABG planned on Friday 05/08/21, postponed due to [place] -19 infection (this is clarified with CTS on 04/25/21), OK with patient be released on 04/26/21  - stop Heparin gtt today, continue medical therapy with  aspirin 81mg  daily, DC pravastatin 80mg  and switch to Lipitor 80mg  daily upon completion of COVID-19 treatment with Paxlovid x5 days, and PRN nitro, no beta blocker given bradycardia 50s on telemetry.   COVID -19 infection, non-severe symptoms  - unable to determine symptoms onset, ID consulted, based on current literature, there is a significantly higher mortality and operative complication risk within the first 8 weeks from COVID diagnosis; thereafter there is no increased operative risk/mortality; recommend 5 days course of Paxlovid which has been completed here  - concurrent amiodarone, statin, Xarelto use is not recommended, as Paxlovid is competed, will resume statin,  amiodarone, and Xarelto today   Paroxysmal AF with RVR intermittently  - hx of long standing PAF and follows Dr. Acie Fredrickson for this, was on diltiazem at home and it was discontinued, course complicated by A fib RVR intermittently this admission, noted sinus bradycardia 50s on telemetry over the past 24 hours, will discontinue metoprolol, start  amiodarone 200mg  BID x7 days and 200mg  daily,  and transition anticoagulation Heparin gtt to Xarelto 20mg  daily today at discharge  - CTS will call patient for instruction on when to hold Xalelto before CABG   Hyperlipidemia -statin was held due to 5 days of Paxlovid use,  switched pravastatin to lipitor 80mg  daily today for high intensity  COPD/tobacco abuse - no acute exacerbation - discussed smoking cessation  - home bronchodilator continued , follow up with PCP    Type 2 Diabetes -A1c 6.5.fairly controlled  - will start low dose metformin 500mg  BID at discharge today, follow up with PCP for A1C check   BPH - continue tamsulosin, follow up with PCP    Did the patient have an acute coronary syndrome (MI, NSTEMI, STEMI, etc) this admission?:  No  Did the patient have a percutaneous coronary intervention (stent / angioplasty)?:  No.       _____________  Discharge Vitals Blood pressure (!) 116/56, pulse (!) 55, temperature 97.6 F (36.4 C), temperature source Oral, resp. rate 15, height 5\' 9"  (1.753 m), weight 72 kg, SpO2 99 %.  Filed Weights   04/20/21 0735 04/20/21 1312  Weight: 74.4 kg 72 kg    Vitals:  Vitals:   04/26/21 0030 04/26/21 0541  BP: (!) 100/53 (!) 116/56  Pulse: (!) 58 (!) 55  Resp: 16 15  Temp: 97.6 F (36.4 C) 97.6 F (36.4 C)  SpO2: 99% 99%   Telemetry:  Sinus rhythm 70s this morning, noted conversion to SR around 5 AM today with sinus bradycardia 50s,  recurrent of A fib RVR at 7AM with rate up to 140s.   PE:   General Appearance: In no apparent distress, laying in bed HEENT:  Normocephalic, atraumatic. EOMs intact.  Neck: Supple, trachea midline, no JVDs Cardiovascular: Regular rate and rhythm, normal S1-S2,  no murmur/rub/gallop Respiratory: Resting breathing unlabored, lungs sounds clear to auscultation bilaterally, no use of accessory muscles. On room air.  No wheezes, rales or rhonchi.   Gastrointestinal: Bowel sounds positive, abdomen soft, non-tender, non-distended.  Extremities: Able to move all extremities in bed without difficulty, no edema/cyanosis/clubbing, varicose vein noted of LLE Genitourinary: genital exam not performed Musculoskeletal: Normal muscle bulk and tone, muscle strength 5/5 throughout Skin: Intact, warm, dry. No rashes or petechiae noted in exposed areas.  Neurologic: Alert, oriented to person, place and time. Fluent speech, no cognitive deficit, no gross focal neuro deficit Psychiatric: Normal affect. Mood is appropriate.  Right wrist without dressing, small scabbed needle wound, no bruise or bleeding, radial pulse +, hand warm to touch   Labs & Radiologic Studies    CBC Recent Labs    04/25/21 0301 04/26/21 0242  WBC 7.4 7.1  HGB 13.1 13.3  HCT 40.5 41.4  MCV 93.3 94.7  PLT 204 182   Basic Metabolic Panel No results for input(s): NA, K, CL, CO2, GLUCOSE, BUN, CREATININE, CALCIUM, MG, PHOS in the last 72 hours. Liver Function Tests No results for input(s): AST, ALT, ALKPHOS, BILITOT, PROT, ALBUMIN in the last 72 hours. No results for input(s): LIPASE, AMYLASE in the last 72 hours. High Sensitivity Troponin:   No results for input(s): TROPONINIHS in the last 720 hours.  BNP Invalid input(s): POCBNP D-Dimer No results for input(s): DDIMER in the last 72 hours. Hemoglobin A1C No results for input(s): HGBA1C in the last 72 hours. Fasting Lipid Panel No results for input(s): CHOL, HDL, LDLCALC, TRIG, CHOLHDL, LDLDIRECT in the last 72 hours. Thyroid Function Tests No results for input(s): TSH, T4TOTAL, T3FREE, THYROIDAB in  the last 72 hours.  Invalid input(s): FREET3 _____________  CT CHEST WO CONTRAST  Result Date: 04/20/2021 CLINICAL DATA:  History of non-small cell lung cancer, coronary artery disease EXAM: CT CHEST WITHOUT CONTRAST TECHNIQUE: Multidetector CT imaging of the chest was performed following the standard protocol without IV contrast. COMPARISON:  04/10/2021, 06/20/2020 FINDINGS: Cardiovascular: Unenhanced imaging of the heart demonstrates stable trace pericardial fluid. Extensive atherosclerosis throughout the coronary vasculature, primarily in the LAD distribution, unchanged since prior exam. Stable ectasia of the thoracic aorta identified, measuring up to 3.9 cm. Diffuse atherosclerosis unchanged. Evaluation of the vascular lumen is limited without IV contrast. Mediastinum/Nodes: Stable subcentimeter mediastinal and hilar lymph nodes. No pathologic adenopathy. Thyroid, trachea, and esophagus are unremarkable. Lungs/Pleura: Left upper lobe consolidation again  identified, extending from the left hilum to the pleural surface, measuring up to 2.2 x 3.1 cm image 55/3, previously measuring 3.5 x 1.9 cm. Findings are most consistent with stable post therapeutic change after prior treatment for lung cancer. Stable soft tissue fullness in the left hilum, also likely related to prior therapy. Evaluation of the hilar structures is limited without IV contrast. Stable trace left pleural effusion. Background upper lobe predominant emphysema again noted unchanged. No acute airspace disease or pneumothorax. No new pulmonary nodules or masses. Upper Abdomen: Moderate hiatal hernia again noted. No acute upper abdominal findings. Musculoskeletal: No acute or destructive bony lesions. Prior healed left anterolateral third rib fracture again noted. Stable sclerotic focus within the T8 vertebral body likely a bone island. Reconstructed images demonstrate no additional findings. IMPRESSION: 1. Chronic post radiation changes within  the left lung and left hilum. No evidence of recurrence or metastatic disease on this exam. 2. Stable ectasia of the thoracic aorta, measuring up to 3.9 cm in diameter. 3. Aortic Atherosclerosis (ICD10-I70.0) and Emphysema (ICD10-J43.9). 4. Stable coronary artery atherosclerosis. 5. Stable hiatal hernia. Electronically Signed   By: Randa Ngo M.D.   On: 04/20/2021 20:53   CARDIAC CATHETERIZATION  Result Date: 04/20/2021  Mid LM to Dist LM lesion is 70% stenosed.  Ost Cx to Prox Cx lesion is 70% stenosed.  Mid Cx lesion is 90% stenosed.  3rd Mrg lesion is 100% stenosed.  Ost LAD to Prox LAD lesion is 80% stenosed.  1st Diag lesion is 70% stenosed.  Ramus lesion is 60% stenosed.  1.  Significant heavily calcified distal left main disease with significant calcified disease affecting the proximal LAD, large first diagonal, ostial ramus and left circumflex.  Occluded OM 3 with collaterals.  The coronary arteries are codominant with no obstructive disease affecting the right coronary artery. 2.  Normal LV systolic function and left ventricular end-diastolic pressure. Recommendations: The patient was in sinus rhythm on presentation but went into A. fib with RVR during catheterization.  He was given 5 mg of IV metoprolol but was still tachycardic.  Due to this and given his significant coronary artery disease, recommend admission to the hospital. Start heparin drip 6 hours after sheath pull. Recommend rate control for atrial fibrillation.  I started oral metoprolol and if rate is still not controlled, diltiazem drip or amiodarone drip can be considered. I requested an echocardiogram. I will consult CT surgery for CABG evaluation.    CT CORONARY MORPH W/CTA COR W/SCORE W/CA W/CM &/OR WO/CM  Addendum Date: 04/15/2021   ADDENDUM REPORT: 04/15/2021 16:28 EXAM: Cardiac/Coronary  CT TECHNIQUE: The patient was scanned on a [employer]. FINDINGS: A 120 kV prospective scan was triggered in the  descending thoracic aorta at 111 HU's. Axial non-contrast 3 mm slices were carried out through the heart. The data set was analyzed on a dedicated work station and scored using the [place]. Gantry rotation speed was 250 msecs and collimation was .6 mm. No beta blockade and 0.8 mg of sl NTG was given. The 3D data set was reconstructed in 5% intervals of the 67-82 % of the R-R cycle. Diastolic phases were analyzed on a dedicated work station using MPR, MIP and VRT modes. The patient received 80 cc of contrast. Aorta: Mildly dilated at the level of the bifurcation of the main pulmonary artery measuring 35mm. Scattered calcifications in the ascending and descending aorta. No dissection. Aortic Valve:  Trileaflet.  No calcifications. Coronary Arteries:  Normal coronary origin.  Right dominance. RCA is a large dominant artery that gives rise to PDA and PLVB. There is mild calcified plaque scattered throughout the RCA with associated stenosis of 25-49%. There is Slab artifact noted in the proximal to mid RCA but no calcified plaque in this area is present. There is significant blooming artifact in the distal RCA and at least mild calcified plaque with associated stenosis of 25-49% but could be as high as 70%. Left main is a large artery that gives rise to LAD, Ramus and LCX arteries. The mid to distal LM is heavily calcified with mild to moderate calcified plaque with associated stenosis of at least 25-49% and may be > 50% and appears to extend into the ostium of the LCx. LAD is a large vessel that gives rise to. There is at least moderate and possible severe calcified plaque in the proximal LAD with associated stenosis of at least 50-69% and may be > 70%. The vessel is heavily calcified with circumferential calcification of the vessel. There is at least mild to moderate calcified plaque in the mid LAD with associated stenosis of 25-49% and possible 50% but may be overestimated due to blooming artifact. Ramus is a  moderate sized vessel.  There is no plaque. LCX is a non-dominant artery that gives rise to one small OM1 branch. There is moderate calcified plaque in the ostial LCx with associated stenosis of 50-69%. This may be overestimated due to significant blooming artifact. There is severe soft plaque in the mid LCx with associated stenosis of 70-99% right at the takeoff of OM1. Other findings: Normal pulmonary vein drainage into the left atrium. Normal let atrial appendage without a thrombus. Normal size of the pulmonary artery. IMPRESSION: 1. Coronary calcium score of 1231. This was 76th percentile for age and sex matched control. 2.  Normal coronary origin with right dominance. 3. Severe atherosclerosis involving the moderate disease of the mid to distal LM and severe atherosclerosis in the LAD and LCx. CAD RADS 4b. 4. Consider symptom-guided anti-ischemic and preventive pharmacotherapy as well as risk factor modification per guideline-directed care. 5.  Recommend cardiac catheterization. 6.  This study has been submitted for FFR flow analysis. Fransico Him Electronically Signed   By: Fransico Him   On: 04/15/2021 16:28   Result Date: 04/15/2021 EXAM: OVER-READ INTERPRETATION  CT CHEST The following report is an over-read performed by radiologist Dr. Aletta Edouard of [facility], [place] on 04/10/2021. This over-read does not include interpretation of cardiac or coronary anatomy or pathology. The coronary CTA interpretation by the cardiologist is attached. COMPARISON:  CT of the chest on 06/20/2020 FINDINGS: Vascular: No significant non coronary vascular findings. Ascending thoracic aorta appears stable and top-normal in caliber measuring 3.9 cm. Mediastinum/Nodes: Large hiatal hernia present. Similar left perihilar soft tissue thickening compared to the prior study presumably related to prior treatment lung carcinoma. Lungs/Pleura: As mentioned above, chronic abnormality in the left perihilar region as noted by  prior chest CT likely relates to prior treatment of lung carcinoma. The entire chest is not imaged on the current study. Visualized lungs show no evidence of pulmonary edema, consolidation, pneumothorax, nodule or pleural fluid. Upper Abdomen: Stable hepatic cysts. Musculoskeletal: Stable sclerosis within the superior and posterior aspect of the T8 vertebral body. This presumably represents a stable sclerotic bone island. IMPRESSION: 1. Stable large hiatal hernia. 2. Stable top-normal ascending thoracic aorta measuring approximately 3.9 cm in greatest diameter. 3. Stable soft tissue thickening in the left perihilar lung presumably related to  prior treatment of lung carcinoma. 4. Stable focal sclerosis within the T8 vertebral body presumably representing a sclerotic bone island. Electronically Signed: By: Aletta Edouard M.D. On: 04/10/2021 12:33   CT CORONARY FRACTIONAL FLOW RESERVE DATA PREP  Result Date: 04/16/2021 EXAM: FFRCT ANALYSIS FINDINGS: FFRct analysis was performed on the original cardiac CT angiogram dataset. Diagrammatic representation of the FFRct analysis is provided in a separate PDF document in PACS. This dictation was created using the PDF document and an interactive 3D model of the results. 3D model is not available in the EMR/PACS. Normal FFR range is >0.80. 1. Left Main: No significant stenosis. LM FFR = 0.99. 2. LAD: Possible stenosis in mid LAD. Proximal FFR = 0.85, Mid FFR = 0.78, Distal FFR = 0.66. Possible stenosis in mid Diagonal. Proximal FFR = 0.85, Distal FFR = 0.74. 3. LCX: Possible stenosis of mid LCx. Proximal FFR = 0.93, Mid FFR = 0.57, Distal FFR = 0.56. 4. Ramus: No significant stenosis. Proximal FFR = 0.96. Vessel not modeled after the proximal portion. 5. RCA: No significant stenosis. Proximal FFR = 0.99, Mid FFR = 0.96, Distal FFR = 0.87. IMPRESSION: 1. Coronary CT FFR demonstrates possible flow limiting lesions in the mid LAD, mid LCX and mid diagonal vessels. 2.   Recommend cardiac catheterization. Fransico Him Electronically Signed   By: Fransico Him   On: 04/16/2021 09:18   ECHOCARDIOGRAM COMPLETE  Result Date: 04/22/2021    ECHOCARDIOGRAM REPORT   Patient Name:   ASHLEIGH ARYA Date of Exam: 04/22/2021 Medical Rec #:  [id number]     Height:       69.0 in Accession #:    [id number]    Weight:       158.7 lb Date of Birth:  Apr 06, 1941     BSA:          1.873 m Patient Age:    42 years      BP:           109/75 mmHg Patient Gender: M             HR:           53 bpm. Exam Location:  Inpatient Procedure: 2D Echo, Color Doppler, Cardiac Doppler and Intracardiac            Opacification Agent Indications:    I48.91* Unspeicified atrial fibrillation  History:        Patient has prior history of Echocardiogram examinations, most                 recent 07/12/2017. COPD; Risk Factors:Diabetes, Dyslipidemia and                 Current Smoker.  Sonographer:    Bernadene Person RDCS Referring Phys: [place]  1. Left ventricular ejection fraction, by estimation, is 60 to 65%. The left ventricle has normal function. The left ventricle has no regional wall motion abnormalities. Left ventricular diastolic parameters are consistent with Grade II diastolic dysfunction (pseudonormalization). Elevated left ventricular end-diastolic pressure. The E/e' is 21.  2. Right ventricular systolic function is mildly reduced. The right ventricular size is normal. There is mildly elevated pulmonary artery systolic pressure. The estimated right ventricular systolic pressure is 83.1 mmHg.  3. The mitral valve is grossly normal. Trivial mitral valve regurgitation.  4. The aortic valve is tricuspid. Aortic valve regurgitation is not visualized.  5. Aortic dilatation noted. There is mild dilatation of the ascending aorta, measuring 40 mm.  6. The inferior vena cava is dilated in size with <50% respiratory variability, suggesting right atrial pressure of 15 mmHg. FINDINGS  Left Ventricle:  Left ventricular ejection fraction, by estimation, is 60 to 65%. The left ventricle has normal function. The left ventricle has no regional wall motion abnormalities. Definity contrast agent was given IV to delineate the left ventricular  endocardial borders. The left ventricular internal cavity size was normal in size. There is no left ventricular hypertrophy. Left ventricular diastolic parameters are consistent with Grade II diastolic dysfunction (pseudonormalization). Elevated left ventricular end-diastolic pressure. The E/e' is 51. Right Ventricle: The right ventricular size is normal. No increase in right ventricular wall thickness. Right ventricular systolic function is mildly reduced. There is mildly elevated pulmonary artery systolic pressure. The tricuspid regurgitant velocity  is 2.55 m/s, and with an assumed right atrial pressure of 15 mmHg, the estimated right ventricular systolic pressure is 93.7 mmHg. Left Atrium: Left atrial size was normal in size. Right Atrium: Right atrial size was normal in size. Pericardium: There is no evidence of pericardial effusion. Mitral Valve: The mitral valve is grossly normal. Trivial mitral valve regurgitation. Tricuspid Valve: The tricuspid valve is grossly normal. Tricuspid valve regurgitation is trivial. Aortic Valve: The aortic valve is tricuspid. Aortic valve regurgitation is not visualized. Pulmonic Valve: The pulmonic valve was normal in structure. Pulmonic valve regurgitation is not visualized. Aorta: Aortic dilatation noted. There is mild dilatation of the ascending aorta, measuring 40 mm. Venous: The inferior vena cava is dilated in size with less than 50% respiratory variability, suggesting right atrial pressure of 15 mmHg. IAS/Shunts: No atrial level shunt detected by color flow Doppler.  LEFT VENTRICLE PLAX 2D LVIDd:         4.70 cm  Diastology LVIDs:         3.20 cm  LV e' medial:    5.80 cm/s LV PW:         1.00 cm  LV E/e' medial:  18.6 LV IVS:         1.00 cm  LV e' lateral:   5.63 cm/s LVOT diam:     2.10 cm  LV E/e' lateral: 19.2 LV SV:         68 LV SV Index:   36 LVOT Area:     3.46 cm  RIGHT VENTRICLE RV S prime:     8.67 cm/s TAPSE (M-mode): 1.5 cm LEFT ATRIUM             Index       RIGHT ATRIUM           Index LA diam:        4.40 cm 2.35 cm/m  RA Area:     20.00 cm LA Vol (A2C):   51.3 ml 27.39 ml/m RA Volume:   58.20 ml  31.08 ml/m LA Vol (A4C):   60.0 ml 32.04 ml/m LA Biplane Vol: 58.2 ml 31.08 ml/m  AORTIC VALVE LVOT Vmax:   79.40 cm/s LVOT Vmean:  56.433 cm/s LVOT VTI:    0.195 m  AORTA Ao Root diam: 3.50 cm Ao Asc diam:  4.00 cm MITRAL VALVE                TRICUSPID VALVE MV Area (PHT): 2.73 cm     TR Peak grad:   26.0 mmHg MV Decel Time: 278 msec     TR Vmax:        255.00 cm/s MV E velocity: 108.00 cm/s MV A  velocity: 36.80 cm/s   SHUNTS MV E/A ratio:  2.93         Systemic VTI:  0.20 m                             Systemic Diam: 2.10 cm [name] MD Electronically signed by [name] MD Signature Date/Time: 04/22/2021/1:04:09 PM    Final    VAS US DOPPLER PRE CABG  Result Date: 04/25/2021 PREOPERATIVE VASCULAR EVALUATION Patient Name:  DEVLIN BRINK  Date of Exam:   04/25/2021 Medical Rec #: [id number]      Accession #:    [id number] Date of Birth: [birth year]      Patient Gender: M Patient Age:   79Y Exam Location:  [facility] Procedure:      VAS US DOPPLER PRE CABG Referring Phys: [id number] [place] --------------------------------------------------------------------------------  Indications:      Pre-CABG. Risk Factors:     Hypertension, hyperlipidemia, Diabetes, past history of                   smoking. Other Factors:    Covid-19+. Comparison Study: No prior studies. Performing Technologist: Darlin Coco RDMS,RVT  Examination Guidelines: A complete evaluation includes B-mode imaging, spectral Doppler, color Doppler, and power Doppler as needed of all accessible portions of each vessel. Bilateral testing is  considered an integral part of a complete examination. Limited examinations for reoccurring indications may be performed as noted.  Right Carotid Findings: +----------+--------+--------+--------+------------+--------+           PSV cm/sEDV cm/sStenosisDescribe    Comments +----------+--------+--------+--------+------------+--------+ CCA Prox  78      16                                   +----------+--------+--------+--------+------------+--------+ CCA Distal63      17                                   +----------+--------+--------+--------+------------+--------+ ICA Prox  60      12      1-39%   heterogenous         +----------+--------+--------+--------+------------+--------+ ICA Distal39      14                                   +----------+--------+--------+--------+------------+--------+ ECA       110     14                                   +----------+--------+--------+--------+------------+--------+ Portions of this table do not appear on this page. +----------+--------+-------+----------------+------------+           PSV cm/sEDV cmsDescribe        Arm Pressure +----------+--------+-------+----------------+------------+ [id number]             Multiphasic, WNL             +----------+--------+-------+----------------+------------+ +---------+--------+--+--------+-+---------+ VertebralPSV cm/s41EDV cm/s8Antegrade +---------+--------+--+--------+-+---------+ Left Carotid Findings: +----------+--------+--------+--------+------------+--------+           PSV cm/sEDV cm/sStenosisDescribe    Comments +----------+--------+--------+--------+------------+--------+ CCA Prox  76      14                                   +----------+--------+--------+--------+------------+--------+  CCA Distal73      15                                   +----------+--------+--------+--------+------------+--------+ ICA Prox  56      18      1-39%    heterogenous         +----------+--------+--------+--------+------------+--------+ ICA Distal35      12                                   +----------+--------+--------+--------+------------+--------+ ECA       80      11                                   +----------+--------+--------+--------+------------+--------+ +----------+--------+--------+----------------+------------+ SubclavianPSV cm/sEDV cm/sDescribe        Arm Pressure +----------+--------+--------+----------------+------------+           94              Multiphasic, WNL             +----------+--------+--------+----------------+------------+ +---------+--------+--+--------+--+---------+ VertebralPSV cm/s42EDV cm/s14Antegrade +---------+--------+--+--------+--+---------+  ABI Findings: +--------+------------------+-----+---------+--------+ Right   Rt Pressure (mmHg)IndexWaveform Comment  +--------+------------------+-----+---------+--------+ Brachial                       triphasic         +--------+------------------+-----+---------+--------+ PTA                            triphasic         +--------+------------------+-----+---------+--------+ DP                             triphasic         +--------+------------------+-----+---------+--------+ +--------+------------------+-----+---------+-------+ Left    Lt Pressure (mmHg)IndexWaveform Comment +--------+------------------+-----+---------+-------+ Brachial                       triphasic        +--------+------------------+-----+---------+-------+ PTA                            triphasic        +--------+------------------+-----+---------+-------+ DP                             triphasic        +--------+------------------+-----+---------+-------+  Right Doppler Findings: +--------+--------+-----+---------+--------+ Site    PressureIndexDoppler  Comments +--------+--------+-----+---------+--------+ Brachial              triphasic         +--------+--------+-----+---------+--------+ Radial               triphasic         +--------+--------+-----+---------+--------+ Ulnar                triphasic         +--------+--------+-----+---------+--------+  Left Doppler Findings: +--------+--------+-----+---------+--------+ Site    PressureIndexDoppler  Comments +--------+--------+-----+---------+--------+ Brachial             triphasic         +--------+--------+-----+---------+--------+ Radial  triphasic         +--------+--------+-----+---------+--------+ Ulnar                triphasic         +--------+--------+-----+---------+--------+  Summary: Right Carotid: Velocities in the right ICA are consistent with a 1-39% stenosis. Left Carotid: Velocities in the left ICA are consistent with a 1-39% stenosis. Vertebrals:  Bilateral vertebral arteries demonstrate antegrade flow. Subclavians: Normal flow hemodynamics were seen in bilateral subclavian              arteries. Right Upper Extremity: Doppler waveforms remain within normal limits with right radial compression. Doppler waveforms remain within normal limits with right ulnar compression. Left Upper Extremity: Doppler waveform obliterate with left radial compression. Doppler waveforms remain within normal limits with left ulnar compression.     Preliminary    Disposition   Patient states he is doing well, denied any chest pain at rest or during ambulation. He is tolerating PO intake. He feels tired, denied any cough or fever. He denied any oliguria or diarrhea. He is agreeable with discharge and medication changes. He reports unable to afford Eliquis. Pt is being discharged home today in good condition. Advised patient to anticipate call from CT surgery regarding pre-op instruction for upcoming CABG on 05/08/21. Follow up arranged with cardiology after CABG scheduled on 06/05/21 at 8:45AM    Follow-up Plans & Appointments     CABG scheduled on 05/08/21  Discharge Instructions     Diet - low sodium heart healthy   Complete by: As directed    Discharge instructions   Complete by: As directed    Medication change:  STOP pravastatin 80mg , start atorvastatin 80mg  daily  Start Amiodarone 200mg  twice daily for 7 days, last day 05/02/21, then 200mg  once daily going forward  STOP diltiazem Start Metformin 500mg  twice daily Resume Xarelto   Anticipate call from CT surgery regarding pre-operative instruction on CABG on 05/08/21   Follow up with your PCP in 1 month for diabetes, COPD   Increase activity slowly   Complete by: As directed        Discharge Medications   Allergies as of 04/26/2021       Reactions   Iodine Other (See Comments)   neck swells   Iohexol Swelling   Neck and gland swelling per patient.        Medication List     STOP taking these medications    diltiazem 120 MG 24 hr capsule Commonly known as: CARDIZEM CD   diphenhydrAMINE 50 MG capsule Commonly known as: BENADRYL   pravastatin 80 MG tablet Commonly known as: PRAVACHOL   predniSONE 50 MG tablet Commonly known as: DELTASONE       TAKE these medications    Advair HFA 115-21 MCG/ACT inhaler Generic drug: fluticasone-salmeterol Inhale 2 puffs into the lungs 2 (two) times daily.   albuterol 108 (90 Base) MCG/ACT inhaler Commonly known as: VENTOLIN HFA Inhale 2 puffs into the lungs every 6 (six) hours as needed for wheezing or shortness of breath.   amiodarone 200 MG tablet Commonly known as: Pacerone Take 200mg  (1 tablet) twice daily for 7 days (last day 05/02/21), then take 200mg  (1 tablet) daily   aspirin EC 81 MG tablet Take 81 mg by mouth once. Swallow whole.   atorvastatin 80 MG tablet Commonly known as: LIPITOR Take 1 tablet (80 mg total) by mouth daily.   metFORMIN 500 MG tablet Commonly known as: Glucophage Take 1 tablet (500  mg total) by mouth 2 (two) times daily with a meal.   multivitamin  with minerals Tabs tablet Take 1 tablet by mouth daily in the afternoon.   nitroGLYCERIN 0.4 MG SL tablet Commonly known as: NITROSTAT Place 1 tablet (0.4 mg total) under the tongue every 5 (five) minutes as needed for chest pain.   omeprazole 40 MG capsule Commonly known as: PRILOSEC Take 1 capsule (40 mg total) by mouth daily. What changed: when to take this   tamsulosin 0.4 MG Caps capsule Commonly known as: FLOMAX TAKE 1 CAPSULE (0.4 MG TOTAL) BY MOUTH DAILY. What changed:  how much to take when to take this   Xarelto 20 MG Tabs tablet Generic drug: rivaroxaban TAKE 1 TABLET BY MOUTH DAILY WITH SUPPER What changed:  how much to take how to take this when to take this           Outstanding Labs/Studies   N/A  Duration of Discharge Encounter   Greater than 30 minutes including physician time.  Signed, Margie Billet, NP 04/26/2021, 10:10 AM

## 2021-04-26 ENCOUNTER — Other Ambulatory Visit (HOSPITAL_COMMUNITY): Payer: Self-pay

## 2021-04-26 ENCOUNTER — Other Ambulatory Visit (HOSPITAL_BASED_OUTPATIENT_CLINIC_OR_DEPARTMENT_OTHER): Payer: Self-pay

## 2021-04-26 ENCOUNTER — Encounter: Payer: Self-pay | Admitting: *Deleted

## 2021-04-26 ENCOUNTER — Other Ambulatory Visit: Payer: Self-pay | Admitting: *Deleted

## 2021-04-26 DIAGNOSIS — I480 Paroxysmal atrial fibrillation: Secondary | ICD-10-CM

## 2021-04-26 DIAGNOSIS — I2510 Atherosclerotic heart disease of native coronary artery without angina pectoris: Secondary | ICD-10-CM

## 2021-04-26 LAB — CBC
HCT: 41.4 % (ref 39.0–52.0)
Hemoglobin: 13.3 g/dL (ref 13.0–17.0)
MCH: 30.4 pg (ref 26.0–34.0)
MCHC: 32.1 g/dL (ref 30.0–36.0)
MCV: 94.7 fL (ref 80.0–100.0)
Platelets: 196 10*3/uL (ref 150–400)
RBC: 4.37 MIL/uL (ref 4.22–5.81)
RDW: 14.5 % (ref 11.5–15.5)
WBC: 7.1 10*3/uL (ref 4.0–10.5)
nRBC: 0 % (ref 0.0–0.2)

## 2021-04-26 LAB — HEPARIN LEVEL (UNFRACTIONATED): Heparin Unfractionated: 0.32 IU/mL (ref 0.30–0.70)

## 2021-04-26 MED ORDER — RIVAROXABAN 20 MG PO TABS
20.0000 mg | ORAL_TABLET | Freq: Every day | ORAL | Status: DC
  Administered 2021-04-26: 20 mg via ORAL
  Filled 2021-04-26: qty 1

## 2021-04-26 MED ORDER — AMIODARONE HCL 200 MG PO TABS
200.0000 mg | ORAL_TABLET | Freq: Two times a day (BID) | ORAL | Status: DC
  Administered 2021-04-26: 200 mg via ORAL
  Filled 2021-04-26: qty 1

## 2021-04-26 MED ORDER — APIXABAN 5 MG PO TABS
5.0000 mg | ORAL_TABLET | Freq: Two times a day (BID) | ORAL | Status: DC
  Filled 2021-04-26: qty 1

## 2021-04-26 MED ORDER — NITROGLYCERIN 0.4 MG SL SUBL
0.4000 mg | SUBLINGUAL_TABLET | SUBLINGUAL | 1 refills | Status: DC | PRN
  Filled 2021-04-26: qty 25, 10d supply, fill #0

## 2021-04-26 MED ORDER — AMIODARONE HCL 200 MG PO TABS
ORAL_TABLET | ORAL | 1 refills | Status: DC
  Filled 2021-04-26: qty 45, 30d supply, fill #0

## 2021-04-26 MED ORDER — ATORVASTATIN CALCIUM 80 MG PO TABS
80.0000 mg | ORAL_TABLET | Freq: Every day | ORAL | Status: DC
  Administered 2021-04-26: 80 mg via ORAL
  Filled 2021-04-26: qty 1

## 2021-04-26 MED ORDER — METFORMIN HCL 500 MG PO TABS
500.0000 mg | ORAL_TABLET | Freq: Two times a day (BID) | ORAL | 0 refills | Status: DC
  Filled 2021-04-26: qty 60, 30d supply, fill #0

## 2021-04-26 MED ORDER — AMIODARONE HCL 200 MG PO TABS: 200.0000 mg | ORAL_TABLET | Freq: Every day | ORAL | Status: DC

## 2021-04-26 MED ORDER — ATORVASTATIN CALCIUM 80 MG PO TABS
80.0000 mg | ORAL_TABLET | Freq: Every day | ORAL | 1 refills | Status: DC
  Filled 2021-04-26: qty 90, 90d supply, fill #0
  Filled 2021-08-03: qty 90, 90d supply, fill #1

## 2021-04-26 MED ORDER — ASPIRIN EC 81 MG PO TBEC: 81.0000 mg | DELAYED_RELEASE_TABLET | Freq: Every day | ORAL | 11 refills | Status: DC

## 2021-04-26 NOTE — Progress Notes (Signed)
He has completed treatment for COVID-19.  We will stop this medication.  We will start amiodarone 200 mg twice daily for 7 days and then 20 mg daily to help with rhythm control.  This will help get him through cardiac surgery.  Restart statin.  We will put him on Eliquis.  Plan is for discharge today and then to return on 05/08/2021 for bypass surgery.  His COVID-19 diagnosis did delay this.  Full discharge summary to follow.  [place] T. Audie Box, MD, [place]  [address], [place], [place] [address] [phone]  8:14 AM

## 2021-04-26 NOTE — Discharge Instructions (Signed)

## 2021-05-03 ENCOUNTER — Other Ambulatory Visit (HOSPITAL_BASED_OUTPATIENT_CLINIC_OR_DEPARTMENT_OTHER): Payer: Self-pay

## 2021-05-04 ENCOUNTER — Other Ambulatory Visit: Payer: Self-pay

## 2021-05-04 ENCOUNTER — Encounter (HOSPITAL_COMMUNITY): Payer: Self-pay

## 2021-05-04 ENCOUNTER — Encounter (HOSPITAL_COMMUNITY)
Admission: RE | Admit: 2021-05-04 | Discharge: 2021-05-04 | Disposition: A | Discharge status: Home / Self Care | Payer: Medicare Other | Source: Ambulatory Visit | Attending: Cardiothoracic Surgery | Admitting: Cardiothoracic Surgery

## 2021-05-04 ENCOUNTER — Encounter (HOSPITAL_COMMUNITY)
Admit: 2021-05-04 | Discharge: 2021-05-04 | Disposition: A | Discharge status: Home / Self Care | Payer: Medicare Other | Source: Ambulatory Visit | Attending: Cardiothoracic Surgery | Admitting: Cardiothoracic Surgery

## 2021-05-04 DIAGNOSIS — Z01818 Encounter for other preprocedural examination: Secondary | ICD-10-CM | POA: Diagnosis not present

## 2021-05-04 DIAGNOSIS — I480 Paroxysmal atrial fibrillation: Secondary | ICD-10-CM

## 2021-05-04 DIAGNOSIS — I2510 Atherosclerotic heart disease of native coronary artery without angina pectoris: Secondary | ICD-10-CM

## 2021-05-04 DIAGNOSIS — K449 Diaphragmatic hernia without obstruction or gangrene: Secondary | ICD-10-CM | POA: Diagnosis not present

## 2021-05-04 HISTORY — DX: Dyspnea, unspecified: R06.00

## 2021-05-04 HISTORY — DX: Atherosclerotic heart disease of native coronary artery without angina pectoris: I25.10

## 2021-05-04 LAB — URINALYSIS, ROUTINE W REFLEX MICROSCOPIC
Bilirubin Urine: NEGATIVE
Glucose, UA: NEGATIVE mg/dL
Ketones, ur: NEGATIVE mg/dL
Leukocytes,Ua: NEGATIVE
Nitrite: NEGATIVE
Protein, ur: NEGATIVE mg/dL
Specific Gravity, Urine: 1.026 (ref 1.005–1.030)
pH: 5 (ref 5.0–8.0)

## 2021-05-04 LAB — COMPREHENSIVE METABOLIC PANEL
ALT: 13 U/L (ref 0–44)
AST: 16 U/L (ref 15–41)
Albumin: 3.3 g/dL — ABNORMAL LOW (ref 3.5–5.0)
Alkaline Phosphatase: 80 U/L (ref 38–126)
Anion gap: 10 (ref 5–15)
BUN: 20 mg/dL (ref 8–23)
CO2: 22 mmol/L (ref 22–32)
Calcium: 9.2 mg/dL (ref 8.9–10.3)
Chloride: 103 mmol/L (ref 98–111)
Creatinine, Ser: 0.97 mg/dL (ref 0.61–1.24)
GFR, Estimated: >60 mL/min (ref >60)
Glucose, Bld: 126 mg/dL — ABNORMAL HIGH (ref 70–99)
Potassium: 3.7 mmol/L (ref 3.5–5.1)
Sodium: 135 mmol/L (ref 135–145)
Total Bilirubin: 0.5 mg/dL (ref 0.3–1.2)
Total Protein: 5.9 g/dL — ABNORMAL LOW (ref 6.5–8.1)

## 2021-05-04 LAB — BLOOD GAS, ARTERIAL
Acid-base deficit: 0.5 mmol/L (ref 0.0–2.0)
Bicarbonate: 23.4 mmol/L (ref 20.0–28.0)
Drawn by: 58793
FIO2: 21
O2 Saturation: 98.2 %
Patient temperature: 37
pCO2 arterial: 36.5 mmHg (ref 32.0–48.0)
pH, Arterial: 7.422 (ref 7.350–7.450)
pO2, Arterial: 113 mmHg — ABNORMAL HIGH (ref 83.0–108.0)

## 2021-05-04 LAB — SURGICAL PCR SCREEN
MRSA, PCR: NEGATIVE
Staphylococcus aureus: NEGATIVE

## 2021-05-04 LAB — CBC
HCT: 42.2 % (ref 39.0–52.0)
Hemoglobin: 14.1 g/dL (ref 13.0–17.0)
MCH: 30.9 pg (ref 26.0–34.0)
MCHC: 33.4 g/dL (ref 30.0–36.0)
MCV: 92.5 fL (ref 80.0–100.0)
Platelets: 255 10*3/uL (ref 150–400)
RBC: 4.56 MIL/uL (ref 4.22–5.81)
RDW: 14 % (ref 11.5–15.5)
WBC: 10.3 10*3/uL (ref 4.0–10.5)
nRBC: 0 % (ref 0.0–0.2)

## 2021-05-04 LAB — PROTIME-INR
INR: 1.1 (ref 0.8–1.2)
Prothrombin Time: 14.2 seconds (ref 11.4–15.2)

## 2021-05-04 LAB — APTT: aPTT: 34 seconds (ref 24–36)

## 2021-05-04 MED ORDER — TRANEXAMIC ACID (OHS) BOLUS VIA INFUSION
15.0000 mg/kg | INTRAVENOUS | Status: AC
  Administered 2021-05-08: 1080 mg via INTRAVENOUS
  Filled 2021-05-04: qty 1080

## 2021-05-04 MED ORDER — TRANEXAMIC ACID (OHS) PUMP PRIME SOLUTION
2.0000 mg/kg | INTRAVENOUS | Status: DC
  Filled 2021-05-04: qty 1.44

## 2021-05-04 MED ORDER — EPINEPHRINE HCL 5 MG/250ML IV SOLN IN NS
0.0000 ug/min | INTRAVENOUS | Status: DC
  Filled 2021-05-04: qty 250

## 2021-05-04 MED ORDER — SODIUM CHLORIDE 0.9 % IV SOLN
INTRAVENOUS | Status: DC
  Filled 2021-05-04: qty 30

## 2021-05-04 MED ORDER — VANCOMYCIN HCL 1250 MG/250ML IV SOLN
1250.0000 mg | INTRAVENOUS | Status: AC
  Administered 2021-05-08: 1250 mg via INTRAVENOUS
  Filled 2021-05-04: qty 250

## 2021-05-04 MED ORDER — NOREPINEPHRINE 4 MG/250ML-% IV SOLN
0.0000 ug/min | INTRAVENOUS | Status: DC
  Filled 2021-05-04: qty 250

## 2021-05-04 MED ORDER — MAGNESIUM SULFATE 50 % IJ SOLN
40.0000 meq | INTRAMUSCULAR | Status: DC
  Filled 2021-05-04: qty 9.85

## 2021-05-04 MED ORDER — INSULIN REGULAR(HUMAN) IN NACL 100-0.9 UT/100ML-% IV SOLN
INTRAVENOUS | Status: AC
  Administered 2021-05-08: 2.4 [IU]/h via INTRAVENOUS
  Filled 2021-05-04: qty 100

## 2021-05-04 MED ORDER — DEXMEDETOMIDINE HCL IN NACL 400 MCG/100ML IV SOLN
0.1000 ug/kg/h | INTRAVENOUS | Status: AC
  Administered 2021-05-08: .3 ug/kg/h via INTRAVENOUS
  Filled 2021-05-04: qty 100

## 2021-05-04 MED ORDER — CEFAZOLIN SODIUM-DEXTROSE 2-4 GM/100ML-% IV SOLN
2.0000 g | INTRAVENOUS | Status: DC
  Filled 2021-05-04: qty 100

## 2021-05-04 MED ORDER — POTASSIUM CHLORIDE 2 MEQ/ML IV SOLN
80.0000 meq | INTRAVENOUS | Status: DC
  Filled 2021-05-04: qty 40

## 2021-05-04 MED ORDER — TRANEXAMIC ACID 1000 MG/10ML IV SOLN
1.5000 mg/kg/h | INTRAVENOUS | Status: AC
  Administered 2021-05-08: 1.5 mg/kg/h via INTRAVENOUS
  Filled 2021-05-04: qty 25

## 2021-05-04 MED ORDER — NITROGLYCERIN IN D5W 200-5 MCG/ML-% IV SOLN
2.0000 ug/min | INTRAVENOUS | Status: DC
  Filled 2021-05-04: qty 250

## 2021-05-04 MED ORDER — PHENYLEPHRINE HCL-NACL 20-0.9 MG/250ML-% IV SOLN
30.0000 ug/min | INTRAVENOUS | Status: AC
  Administered 2021-05-08: 20 ug/min via INTRAVENOUS
  Filled 2021-05-04: qty 250

## 2021-05-04 MED ORDER — CEFAZOLIN SODIUM-DEXTROSE 2-4 GM/100ML-% IV SOLN
2.0000 g | INTRAVENOUS | Status: AC
  Administered 2021-05-08 (×2): 2 g via INTRAVENOUS
  Filled 2021-05-04: qty 100

## 2021-05-04 MED ORDER — MILRINONE LACTATE IN DEXTROSE 20-5 MG/100ML-% IV SOLN
0.3000 ug/kg/min | INTRAVENOUS | Status: DC
  Filled 2021-05-04: qty 100

## 2021-05-04 MED ORDER — PLASMA-LYTE A IV SOLN
INTRAVENOUS | Status: DC
  Filled 2021-05-04: qty 5

## 2021-05-04 NOTE — Progress Notes (Signed)
PCP - [name], NP Cardiologist - Mertie Moores, MD; Kathyrn Drown, NP  PPM/ICD - Denies  Chest x-ray - 05/04/21 EKG - 04/21/21 Stress Test - 04/20/21? ECHO - 04/22/21 Cardiac Cath - 04/20/21  Sleep Study - Denies  Patient is diabetic, does not check CBGs. A1C obtained.  Blood Thinner Instructions: Per pt, stop Xarelto 5 days before sx. Aspirin Instructions: Per pt, continue taking  ERAS Protcol - N/A PRE-SURGERY Ensure or G2- N/A  COVID TEST- N/A; Covid positive 04/23/21   Anesthesia review: Yes, cardiac hx.  Patient denies shortness of breath, fever, cough and chest pain at PAT appointment   All instructions explained to the patient, with a verbal understanding of the material. Patient agrees to go over the instructions while at home for a better understanding. Patient also instructed to self quarantine after being tested for COVID-19. The opportunity to ask questions was provided.

## 2021-05-04 NOTE — Pre-Procedure Instructions (Signed)
Surgical Instructions:    Your procedure is scheduled on Tuesday, July 5th.  Report to [facility] Main Entrance "A" at 05:30 A.M., then check in with the Admitting office.  Call this number if you have any questions prior to your surgery date, or have problems the morning of surgery:  [phone]    Remember:  Do not eat or drink after midnight the night before your surgery.     Take these medicines the morning of surgery with A SIP OF WATER:   amiodarone (PACERONE) atorvastatin (LIPITOR) omeprazole (PRILOSEC) tamsulosin (FLOMAX)  mometasone-formoterol (DULERA)   IF NEEDED: acetaminophen (TYLENOL)  nitroGLYCERIN (NITROSTAT)  albuterol (VENTOLIN HFA) inhaler- Please bring all inhalers with you the day of surgery.    *Follow your surgeon's instructions on when to stop Aspirin and rivaroxaban (XARELTO).  If no instructions were given by your surgeon then you will need to call the office to get those instructions.     As of today, STOP taking any Aleve, Naproxen, Ibuprofen, Motrin, Advil, Goody's, BC's, all herbal medications, fish oil, and all vitamins.            WHAT DO I DO ABOUT MY DIABETES MEDICATION? Do not take metFORMIN (GLUCOPHAGE) the morning of surgery.   HOW TO MANAGE YOUR DIABETES BEFORE AND AFTER SURGERY  Why is it important to control my blood sugar before and after surgery? Improving blood sugar levels before and after surgery helps healing and can limit problems. A way of improving blood sugar control is eating a healthy diet by:  Eating less sugar and carbohydrates  Increasing activity/exercise  Talking with your doctor about reaching your blood sugar goals High blood sugars (greater than 180 mg/dL) can raise your risk of infections and slow your recovery, so you will need to focus on controlling your diabetes during the weeks before surgery. Make sure that the doctor who takes care of your diabetes knows about your planned surgery including the date  and location.  How do I manage my blood sugar before surgery? Check your blood sugar at least 4 times a day, starting 2 days before surgery, to make sure that the level is not too high or low.  Check your blood sugar the morning of your surgery when you wake up and every 2 hours until you get to the Short Stay unit.  If your blood sugar is less than 70 mg/dL, you will need to treat for low blood sugar: Do not take insulin. Treat a low blood sugar (less than 70 mg/dL) with  cup of clear juice (cranberry or apple), 4 glucose tablets, OR glucose gel. Recheck blood sugar in 15 minutes after treatment (to make sure it is greater than 70 mg/dL). If your blood sugar is not greater than 70 mg/dL on recheck, call [phone] for further instructions. Report your blood sugar to the short stay nurse when you get to Short Stay.  If you are admitted to the hospital after surgery: Your blood sugar will be checked by the staff and you will probably be given insulin after surgery (instead of oral diabetes medicines) to make sure you have good blood sugar levels. The goal for blood sugar control after surgery is 80-180 mg/dL.    Special instructions:    [place]- Preparing For Surgery  Before surgery, you can play an important role. Because skin is not sterile, your skin needs to be as free of germs as possible. You can reduce the number of germs on your skin by  washing with CHG (chlorahexidine gluconate) Soap before surgery.  CHG is an antiseptic cleaner which kills germs and bonds with the skin to continue killing germs even after washing.     Please do not use if you have an allergy to CHG or antibacterial soaps. If your skin becomes reddened/irritated stop using the CHG.  Do not shave (including legs and underarms) for at least 48 hours prior to first CHG shower. It is OK to shave your face.  Please follow these instructions carefully.     Shower the NIGHT BEFORE SURGERY and the MORNING OF  SURGERY with CHG Soap.   If you chose to wash your hair, wash your hair first as usual with your normal shampoo. After you shampoo, rinse your hair and body thoroughly to remove the shampoo.  Then [employer] and genitals (private parts) with your normal soap and rinse thoroughly to remove soap.  After that Use CHG Soap as you would any other liquid soap. You can apply CHG directly to the skin and wash gently with a scrungie or a clean washcloth.   Apply the CHG Soap to your body ONLY FROM THE NECK DOWN.  Do not use on open wounds or open sores. Avoid contact with your eyes, ears, mouth and genitals (private parts). Wash Face and genitals (private parts)  with your normal soap.   Wash thoroughly, paying special attention to the area where your surgery will be performed.  Thoroughly rinse your body with warm water from the neck down.  DO NOT shower/wash with your normal soap after using and rinsing off the CHG Soap.  Pat yourself dry with a CLEAN TOWEL.  Wear CLEAN PAJAMAS to bed the night before surgery  Place CLEAN SHEETS on your bed the night before your surgery  DO NOT SLEEP WITH PETS.   Day of Surgery:  Take a shower with CHG soap. Wear Clean/Comfortable clothing the morning of surgery Do not wear lotions, powders, perfumes/colognes, or deodorant.   Remember to brush your teeth WITH YOUR REGULAR TOOTHPASTE. Do not wear jewelry. Men may shave face and neck. Do not bring valuables to the hospital. [facility] is not responsible for any belongings or valuables.  Do NOT Smoke (Tobacco/Vaping) or drink Alcohol 24 hours prior to your procedure.  If you use a CPAP at night, you may bring all equipment for your overnight stay.   Contacts, glasses, dentures or bridgework may not be worn into surgery, please bring cases for these belongings.   For patients admitted to the hospital, discharge time will be determined by your treatment team.  Patients discharged the day of surgery will  not be allowed to drive home, and someone needs to stay with them for 24 hours.  ONLY 1 SUPPORT PERSON MAY BE PRESENT WHILE YOU ARE IN SURGERY. IF YOU ARE TO BE ADMITTED ONCE YOU ARE IN YOUR ROOM YOU WILL BE ALLOWED TWO (2) VISITORS.  Minor children may have two parents present. Special consideration for safety and communication needs will be reviewed on a case by case basis.     Please read over the following fact sheets that you were given.

## 2021-05-08 ENCOUNTER — Inpatient Hospital Stay (HOSPITAL_COMMUNITY)
Admission: RE | Admit: 2021-05-08 | Discharge: 2021-05-21 | DRG: 236 | Disposition: A | Discharge status: Home Health | Payer: Medicare Other | Attending: Cardiothoracic Surgery | Admitting: Cardiothoracic Surgery

## 2021-05-08 ENCOUNTER — Encounter (HOSPITAL_COMMUNITY): Payer: Self-pay | Admitting: Cardiothoracic Surgery

## 2021-05-08 ENCOUNTER — Inpatient Hospital Stay (HOSPITAL_COMMUNITY)
Admission: RE | Disposition: A | Discharge status: Home Health | Payer: Self-pay | Source: Home / Self Care | Attending: Cardiothoracic Surgery

## 2021-05-08 ENCOUNTER — Inpatient Hospital Stay (HOSPITAL_COMMUNITY): Payer: Medicare Other

## 2021-05-08 ENCOUNTER — Inpatient Hospital Stay (HOSPITAL_COMMUNITY): Payer: Medicare Other | Admitting: Vascular Surgery

## 2021-05-08 ENCOUNTER — Inpatient Hospital Stay (HOSPITAL_COMMUNITY): Payer: Medicare Other | Admitting: Physician Assistant

## 2021-05-08 ENCOUNTER — Other Ambulatory Visit: Payer: Self-pay

## 2021-05-08 DIAGNOSIS — D696 Thrombocytopenia, unspecified: Secondary | ICD-10-CM | POA: Diagnosis not present

## 2021-05-08 DIAGNOSIS — Z951 Presence of aortocoronary bypass graft: Secondary | ICD-10-CM

## 2021-05-08 DIAGNOSIS — I083 Combined rheumatic disorders of mitral, aortic and tricuspid valves: Secondary | ICD-10-CM | POA: Diagnosis present

## 2021-05-08 DIAGNOSIS — J939 Pneumothorax, unspecified: Secondary | ICD-10-CM

## 2021-05-08 DIAGNOSIS — Z888 Allergy status to other drugs, medicaments and biological substances status: Secondary | ICD-10-CM

## 2021-05-08 DIAGNOSIS — R110 Nausea: Secondary | ICD-10-CM | POA: Diagnosis not present

## 2021-05-08 DIAGNOSIS — I9589 Other hypotension: Secondary | ICD-10-CM | POA: Diagnosis not present

## 2021-05-08 DIAGNOSIS — Z4682 Encounter for fitting and adjustment of non-vascular catheter: Secondary | ICD-10-CM | POA: Diagnosis not present

## 2021-05-08 DIAGNOSIS — Z806 Family history of leukemia: Secondary | ICD-10-CM

## 2021-05-08 DIAGNOSIS — Z85118 Personal history of other malignant neoplasm of bronchus and lung: Secondary | ICD-10-CM

## 2021-05-08 DIAGNOSIS — I2510 Atherosclerotic heart disease of native coronary artery without angina pectoris: Secondary | ICD-10-CM | POA: Diagnosis not present

## 2021-05-08 DIAGNOSIS — J984 Other disorders of lung: Secondary | ICD-10-CM | POA: Diagnosis not present

## 2021-05-08 DIAGNOSIS — I517 Cardiomegaly: Secondary | ICD-10-CM | POA: Diagnosis not present

## 2021-05-08 DIAGNOSIS — E785 Hyperlipidemia, unspecified: Secondary | ICD-10-CM | POA: Diagnosis not present

## 2021-05-08 DIAGNOSIS — D62 Acute posthemorrhagic anemia: Secondary | ICD-10-CM | POA: Diagnosis not present

## 2021-05-08 DIAGNOSIS — Z86011 Personal history of benign neoplasm of the brain: Secondary | ICD-10-CM

## 2021-05-08 DIAGNOSIS — I959 Hypotension, unspecified: Secondary | ICD-10-CM | POA: Diagnosis not present

## 2021-05-08 DIAGNOSIS — Z9049 Acquired absence of other specified parts of digestive tract: Secondary | ICD-10-CM

## 2021-05-08 DIAGNOSIS — E440 Moderate protein-calorie malnutrition: Secondary | ICD-10-CM | POA: Insufficient documentation

## 2021-05-08 DIAGNOSIS — K219 Gastro-esophageal reflux disease without esophagitis: Secondary | ICD-10-CM | POA: Diagnosis not present

## 2021-05-08 DIAGNOSIS — Z923 Personal history of irradiation: Secondary | ICD-10-CM

## 2021-05-08 DIAGNOSIS — Z823 Family history of stroke: Secondary | ICD-10-CM

## 2021-05-08 DIAGNOSIS — Z9889 Other specified postprocedural states: Secondary | ICD-10-CM

## 2021-05-08 DIAGNOSIS — I5032 Chronic diastolic (congestive) heart failure: Secondary | ICD-10-CM | POA: Diagnosis not present

## 2021-05-08 DIAGNOSIS — I480 Paroxysmal atrial fibrillation: Secondary | ICD-10-CM | POA: Diagnosis present

## 2021-05-08 DIAGNOSIS — I739 Peripheral vascular disease, unspecified: Secondary | ICD-10-CM | POA: Diagnosis not present

## 2021-05-08 DIAGNOSIS — E119 Type 2 diabetes mellitus without complications: Secondary | ICD-10-CM | POA: Diagnosis present

## 2021-05-08 DIAGNOSIS — I4892 Unspecified atrial flutter: Secondary | ICD-10-CM | POA: Diagnosis not present

## 2021-05-08 DIAGNOSIS — J90 Pleural effusion, not elsewhere classified: Secondary | ICD-10-CM | POA: Diagnosis present

## 2021-05-08 DIAGNOSIS — Z825 Family history of asthma and other chronic lower respiratory diseases: Secondary | ICD-10-CM

## 2021-05-08 DIAGNOSIS — E1136 Type 2 diabetes mellitus with diabetic cataract: Secondary | ICD-10-CM | POA: Diagnosis not present

## 2021-05-08 DIAGNOSIS — Y842 Radiological procedure and radiotherapy as the cause of abnormal reaction of the patient, or of later complication, without mention of misadventure at the time of the procedure: Secondary | ICD-10-CM | POA: Diagnosis present

## 2021-05-08 DIAGNOSIS — Z8616 Personal history of COVID-19: Secondary | ICD-10-CM | POA: Diagnosis not present

## 2021-05-08 DIAGNOSIS — E1151 Type 2 diabetes mellitus with diabetic peripheral angiopathy without gangrene: Secondary | ICD-10-CM | POA: Diagnosis present

## 2021-05-08 DIAGNOSIS — F17290 Nicotine dependence, other tobacco product, uncomplicated: Secondary | ICD-10-CM | POA: Diagnosis present

## 2021-05-08 DIAGNOSIS — Z79899 Other long term (current) drug therapy: Secondary | ICD-10-CM

## 2021-05-08 DIAGNOSIS — J449 Chronic obstructive pulmonary disease, unspecified: Secondary | ICD-10-CM | POA: Diagnosis present

## 2021-05-08 DIAGNOSIS — I25110 Atherosclerotic heart disease of native coronary artery with unstable angina pectoris: Secondary | ICD-10-CM | POA: Diagnosis not present

## 2021-05-08 DIAGNOSIS — R0602 Shortness of breath: Secondary | ICD-10-CM | POA: Diagnosis not present

## 2021-05-08 DIAGNOSIS — I4891 Unspecified atrial fibrillation: Secondary | ICD-10-CM | POA: Diagnosis not present

## 2021-05-08 DIAGNOSIS — N210 Calculus in bladder: Secondary | ICD-10-CM | POA: Diagnosis not present

## 2021-05-08 DIAGNOSIS — Z7982 Long term (current) use of aspirin: Secondary | ICD-10-CM

## 2021-05-08 DIAGNOSIS — I77810 Thoracic aortic ectasia: Secondary | ICD-10-CM | POA: Diagnosis present

## 2021-05-08 DIAGNOSIS — I10 Essential (primary) hypertension: Secondary | ICD-10-CM | POA: Diagnosis not present

## 2021-05-08 DIAGNOSIS — I110 Hypertensive heart disease with heart failure: Secondary | ICD-10-CM | POA: Diagnosis present

## 2021-05-08 DIAGNOSIS — F419 Anxiety disorder, unspecified: Secondary | ICD-10-CM | POA: Diagnosis present

## 2021-05-08 DIAGNOSIS — Z7901 Long term (current) use of anticoagulants: Secondary | ICD-10-CM | POA: Diagnosis not present

## 2021-05-08 DIAGNOSIS — L03115 Cellulitis of right lower limb: Secondary | ICD-10-CM | POA: Diagnosis not present

## 2021-05-08 DIAGNOSIS — Z87442 Personal history of urinary calculi: Secondary | ICD-10-CM

## 2021-05-08 DIAGNOSIS — J9811 Atelectasis: Secondary | ICD-10-CM | POA: Diagnosis present

## 2021-05-08 DIAGNOSIS — E8779 Other fluid overload: Secondary | ICD-10-CM | POA: Diagnosis not present

## 2021-05-08 DIAGNOSIS — Z87891 Personal history of nicotine dependence: Secondary | ICD-10-CM

## 2021-05-08 HISTORY — PX: TEE WITHOUT CARDIOVERSION: SHX5443

## 2021-05-08 HISTORY — PX: ENDOVEIN HARVEST OF GREATER SAPHENOUS VEIN: SHX5059

## 2021-05-08 HISTORY — PX: CORONARY ARTERY BYPASS GRAFT: SHX141

## 2021-05-08 HISTORY — PX: CLIPPING OF ATRIAL APPENDAGE: SHX5773

## 2021-05-08 LAB — GLUCOSE, CAPILLARY
Glucose-Capillary: 111 mg/dL — ABNORMAL HIGH (ref 70–99)
Glucose-Capillary: 116 mg/dL — ABNORMAL HIGH (ref 70–99)
Glucose-Capillary: 127 mg/dL — ABNORMAL HIGH (ref 70–99)
Glucose-Capillary: 138 mg/dL — ABNORMAL HIGH (ref 70–99)
Glucose-Capillary: 139 mg/dL — ABNORMAL HIGH (ref 70–99)
Glucose-Capillary: 149 mg/dL — ABNORMAL HIGH (ref 70–99)
Glucose-Capillary: 159 mg/dL — ABNORMAL HIGH (ref 70–99)
Glucose-Capillary: 169 mg/dL — ABNORMAL HIGH (ref 70–99)
Glucose-Capillary: 191 mg/dL — ABNORMAL HIGH (ref 70–99)
Glucose-Capillary: 69 mg/dL — ABNORMAL LOW (ref 70–99)
Glucose-Capillary: 94 mg/dL (ref 70–99)

## 2021-05-08 LAB — CBC
HCT: 27.6 % — ABNORMAL LOW (ref 39.0–52.0)
HCT: 26.2 % — ABNORMAL LOW (ref 39.0–52.0)
Hemoglobin: 9 g/dL — ABNORMAL LOW (ref 13.0–17.0)
Hemoglobin: 8.5 g/dL — ABNORMAL LOW (ref 13.0–17.0)
MCH: 31.1 pg (ref 26.0–34.0)
MCH: 30.9 pg (ref 26.0–34.0)
MCHC: 32.6 g/dL (ref 30.0–36.0)
MCHC: 32.4 g/dL (ref 30.0–36.0)
MCV: 95.5 fL (ref 80.0–100.0)
MCV: 95.3 fL (ref 80.0–100.0)
Platelets: 146 10*3/uL — ABNORMAL LOW (ref 150–400)
Platelets: 160 10*3/uL (ref 150–400)
RBC: 2.89 MIL/uL — ABNORMAL LOW (ref 4.22–5.81)
RBC: 2.75 MIL/uL — ABNORMAL LOW (ref 4.22–5.81)
RDW: 13.9 % (ref 11.5–15.5)
RDW: 13.9 % (ref 11.5–15.5)
WBC: 14.5 10*3/uL — ABNORMAL HIGH (ref 4.0–10.5)
WBC: 9.7 10*3/uL (ref 4.0–10.5)
nRBC: 0 % (ref 0.0–0.2)
nRBC: 0 % (ref 0.0–0.2)

## 2021-05-08 LAB — POCT I-STAT 7, (LYTES, BLD GAS, ICA,H+H)
Acid-Base Excess: 2 mmol/L (ref 0.0–2.0)
Acid-Base Excess: 5 mmol/L — ABNORMAL HIGH (ref 0.0–2.0)
Acid-Base Excess: 3 mmol/L — ABNORMAL HIGH (ref 0.0–2.0)
Acid-Base Excess: 5 mmol/L — ABNORMAL HIGH (ref 0.0–2.0)
Acid-Base Excess: 1 mmol/L (ref 0.0–2.0)
Acid-base deficit: 1 mmol/L (ref 0.0–2.0)
Acid-base deficit: 2 mmol/L (ref 0.0–2.0)
Acid-base deficit: 2 mmol/L (ref 0.0–2.0)
Bicarbonate: 27.8 mmol/L (ref 20.0–28.0)
Bicarbonate: 30.7 mmol/L — ABNORMAL HIGH (ref 20.0–28.0)
Bicarbonate: 27.3 mmol/L (ref 20.0–28.0)
Bicarbonate: 29.4 mmol/L — ABNORMAL HIGH (ref 20.0–28.0)
Bicarbonate: 27 mmol/L (ref 20.0–28.0)
Bicarbonate: 23.4 mmol/L (ref 20.0–28.0)
Bicarbonate: 23.8 mmol/L (ref 20.0–28.0)
Bicarbonate: 23.2 mmol/L (ref 20.0–28.0)
Calcium, Ion: 1.26 mmol/L (ref 1.15–1.40)
Calcium, Ion: 1.06 mmol/L — ABNORMAL LOW (ref 1.15–1.40)
Calcium, Ion: 1.04 mmol/L — ABNORMAL LOW (ref 1.15–1.40)
Calcium, Ion: 1.07 mmol/L — ABNORMAL LOW (ref 1.15–1.40)
Calcium, Ion: 1.16 mmol/L (ref 1.15–1.40)
Calcium, Ion: 1.1 mmol/L — ABNORMAL LOW (ref 1.15–1.40)
Calcium, Ion: 1.12 mmol/L — ABNORMAL LOW (ref 1.15–1.40)
Calcium, Ion: 1.1 mmol/L — ABNORMAL LOW (ref 1.15–1.40)
HCT: 38 % — ABNORMAL LOW (ref 39.0–52.0)
HCT: 26 % — ABNORMAL LOW (ref 39.0–52.0)
HCT: 23 % — ABNORMAL LOW (ref 39.0–52.0)
HCT: 27 % — ABNORMAL LOW (ref 39.0–52.0)
HCT: 24 % — ABNORMAL LOW (ref 39.0–52.0)
HCT: 25 % — ABNORMAL LOW (ref 39.0–52.0)
HCT: 24 % — ABNORMAL LOW (ref 39.0–52.0)
HCT: 26 % — ABNORMAL LOW (ref 39.0–52.0)
Hemoglobin: 12.9 g/dL — ABNORMAL LOW (ref 13.0–17.0)
Hemoglobin: 8.8 g/dL — ABNORMAL LOW (ref 13.0–17.0)
Hemoglobin: 7.8 g/dL — ABNORMAL LOW (ref 13.0–17.0)
Hemoglobin: 9.2 g/dL — ABNORMAL LOW (ref 13.0–17.0)
Hemoglobin: 8.2 g/dL — ABNORMAL LOW (ref 13.0–17.0)
Hemoglobin: 8.5 g/dL — ABNORMAL LOW (ref 13.0–17.0)
Hemoglobin: 8.2 g/dL — ABNORMAL LOW (ref 13.0–17.0)
Hemoglobin: 8.8 g/dL — ABNORMAL LOW (ref 13.0–17.0)
O2 Saturation: 100 %
O2 Saturation: 100 %
O2 Saturation: 100 %
O2 Saturation: 100 %
O2 Saturation: 99 %
O2 Saturation: 99 %
O2 Saturation: 97 %
O2 Saturation: 96 %
Patient temperature: 37
Patient temperature: 37
Patient temperature: 37
Patient temperature: 37
Patient temperature: 35.3
Patient temperature: 36.9
Patient temperature: 36.9
Patient temperature: 37.1
Potassium: 4.1 mmol/L (ref 3.5–5.1)
Potassium: 4.2 mmol/L (ref 3.5–5.1)
Potassium: 4.3 mmol/L (ref 3.5–5.1)
Potassium: 4.6 mmol/L (ref 3.5–5.1)
Potassium: 4 mmol/L (ref 3.5–5.1)
Potassium: 4.2 mmol/L (ref 3.5–5.1)
Potassium: 4.3 mmol/L (ref 3.5–5.1)
Potassium: 4.3 mmol/L (ref 3.5–5.1)
Sodium: 139 mmol/L (ref 135–145)
Sodium: 140 mmol/L (ref 135–145)
Sodium: 140 mmol/L (ref 135–145)
Sodium: 141 mmol/L (ref 135–145)
Sodium: 141 mmol/L (ref 135–145)
Sodium: 139 mmol/L (ref 135–145)
Sodium: 139 mmol/L (ref 135–145)
Sodium: 138 mmol/L (ref 135–145)
TCO2: 29 mmol/L (ref 22–32)
TCO2: 32 mmol/L (ref 22–32)
TCO2: 29 mmol/L (ref 22–32)
TCO2: 31 mmol/L (ref 22–32)
TCO2: 29 mmol/L (ref 22–32)
TCO2: 25 mmol/L (ref 22–32)
TCO2: 25 mmol/L (ref 22–32)
TCO2: 24 mmol/L (ref 22–32)
pCO2 arterial: 45.8 mmHg (ref 32.0–48.0)
pCO2 arterial: 50.2 mmHg — ABNORMAL HIGH (ref 32.0–48.0)
pCO2 arterial: 39.5 mmHg (ref 32.0–48.0)
pCO2 arterial: 41.6 mmHg (ref 32.0–48.0)
pCO2 arterial: 48.9 mmHg — ABNORMAL HIGH (ref 32.0–48.0)
pCO2 arterial: 37.3 mmHg (ref 32.0–48.0)
pCO2 arterial: 42.1 mmHg (ref 32.0–48.0)
pCO2 arterial: 39.5 mmHg (ref 32.0–48.0)
pH, Arterial: 7.391 (ref 7.350–7.450)
pH, Arterial: 7.395 (ref 7.350–7.450)
pH, Arterial: 7.425 (ref 7.350–7.450)
pH, Arterial: 7.481 — ABNORMAL HIGH (ref 7.350–7.450)
pH, Arterial: 7.341 — ABNORMAL LOW (ref 7.350–7.450)
pH, Arterial: 7.405 (ref 7.350–7.450)
pH, Arterial: 7.36 (ref 7.350–7.450)
pH, Arterial: 7.378 (ref 7.350–7.450)
pO2, Arterial: 339 mmHg — ABNORMAL HIGH (ref 83.0–108.0)
pO2, Arterial: 382 mmHg — ABNORMAL HIGH (ref 83.0–108.0)
pO2, Arterial: 292 mmHg — ABNORMAL HIGH (ref 83.0–108.0)
pO2, Arterial: 344 mmHg — ABNORMAL HIGH (ref 83.0–108.0)
pO2, Arterial: 130 mmHg — ABNORMAL HIGH (ref 83.0–108.0)
pO2, Arterial: 139 mmHg — ABNORMAL HIGH (ref 83.0–108.0)
pO2, Arterial: 95 mmHg (ref 83.0–108.0)
pO2, Arterial: 87 mmHg (ref 83.0–108.0)

## 2021-05-08 LAB — BASIC METABOLIC PANEL
Anion gap: 8 (ref 5–15)
BUN: 12 mg/dL (ref 8–23)
CO2: 22 mmol/L (ref 22–32)
Calcium: 7.2 mg/dL — ABNORMAL LOW (ref 8.9–10.3)
Chloride: 105 mmol/L (ref 98–111)
Creatinine, Ser: 0.79 mg/dL (ref 0.61–1.24)
GFR, Estimated: >60 mL/min (ref >60)
Glucose, Bld: 147 mg/dL — ABNORMAL HIGH (ref 70–99)
Potassium: 4.2 mmol/L (ref 3.5–5.1)
Sodium: 135 mmol/L (ref 135–145)

## 2021-05-08 LAB — POCT I-STAT, CHEM 8
BUN: 15 mg/dL (ref 8–23)
BUN: 14 mg/dL (ref 8–23)
BUN: 15 mg/dL (ref 8–23)
Calcium, Ion: 1.16 mmol/L (ref 1.15–1.40)
Calcium, Ion: 1.1 mmol/L — ABNORMAL LOW (ref 1.15–1.40)
Calcium, Ion: 1.08 mmol/L — ABNORMAL LOW (ref 1.15–1.40)
Chloride: 101 mmol/L (ref 98–111)
Chloride: 102 mmol/L (ref 98–111)
Chloride: 102 mmol/L (ref 98–111)
Creatinine, Ser: 0.7 mg/dL (ref 0.61–1.24)
Creatinine, Ser: 0.6 mg/dL — ABNORMAL LOW (ref 0.61–1.24)
Creatinine, Ser: 0.6 mg/dL — ABNORMAL LOW (ref 0.61–1.24)
Glucose, Bld: 126 mg/dL — ABNORMAL HIGH (ref 70–99)
Glucose, Bld: 131 mg/dL — ABNORMAL HIGH (ref 70–99)
Glucose, Bld: 133 mg/dL — ABNORMAL HIGH (ref 70–99)
HCT: 31 % — ABNORMAL LOW (ref 39.0–52.0)
HCT: 26 % — ABNORMAL LOW (ref 39.0–52.0)
HCT: 25 % — ABNORMAL LOW (ref 39.0–52.0)
Hemoglobin: 10.5 g/dL — ABNORMAL LOW (ref 13.0–17.0)
Hemoglobin: 8.8 g/dL — ABNORMAL LOW (ref 13.0–17.0)
Hemoglobin: 8.5 g/dL — ABNORMAL LOW (ref 13.0–17.0)
Potassium: 4 mmol/L (ref 3.5–5.1)
Potassium: 4.6 mmol/L (ref 3.5–5.1)
Potassium: 4.3 mmol/L (ref 3.5–5.1)
Sodium: 139 mmol/L (ref 135–145)
Sodium: 139 mmol/L (ref 135–145)
Sodium: 140 mmol/L (ref 135–145)
TCO2: 28 mmol/L (ref 22–32)
TCO2: 28 mmol/L (ref 22–32)
TCO2: 27 mmol/L (ref 22–32)

## 2021-05-08 LAB — APTT: aPTT: 33 seconds (ref 24–36)

## 2021-05-08 LAB — PROTIME-INR
INR: 1.4 — ABNORMAL HIGH (ref 0.8–1.2)
Prothrombin Time: 17.5 seconds — ABNORMAL HIGH (ref 11.4–15.2)

## 2021-05-08 LAB — HEMOGLOBIN AND HEMATOCRIT, BLOOD
HCT: 24.7 % — ABNORMAL LOW (ref 39.0–52.0)
Hemoglobin: 8.2 g/dL — ABNORMAL LOW (ref 13.0–17.0)

## 2021-05-08 LAB — MAGNESIUM: Magnesium: 2.4 mg/dL (ref 1.7–2.4)

## 2021-05-08 LAB — ABO/RH: ABO/RH(D): O POS

## 2021-05-08 LAB — PLATELET COUNT: Platelets: 149 10*3/uL — ABNORMAL LOW (ref 150–400)

## 2021-05-08 SURGERY — CORONARY ARTERY BYPASS GRAFTING (CABG)
Anesthesia: General | Site: Chest | Laterality: Right

## 2021-05-08 MED ORDER — NITROGLYCERIN IN D5W 200-5 MCG/ML-% IV SOLN: 0.0000 ug/min | INTRAVENOUS | Status: DC

## 2021-05-08 MED ORDER — ASPIRIN EC 325 MG PO TBEC
325.0000 mg | DELAYED_RELEASE_TABLET | Freq: Every day | ORAL | Status: DC
  Administered 2021-05-09 – 2021-05-12 (×4): 325 mg via ORAL
  Filled 2021-05-08 (×4): qty 1

## 2021-05-08 MED ORDER — ALBUMIN HUMAN 5 % IV SOLN
250.0000 mL | INTRAVENOUS | Status: AC | PRN
  Administered 2021-05-08 – 2021-05-09 (×3): 12.5 g via INTRAVENOUS
  Filled 2021-05-08 (×2): qty 250

## 2021-05-08 MED ORDER — FAMOTIDINE IN NACL 20-0.9 MG/50ML-% IV SOLN
20.0000 mg | Freq: Two times a day (BID) | INTRAVENOUS | Status: DC
  Administered 2021-05-08: 20 mg via INTRAVENOUS
  Filled 2021-05-08: qty 50

## 2021-05-08 MED ORDER — METOPROLOL TARTRATE 5 MG/5ML IV SOLN
2.5000 mg | INTRAVENOUS | Status: DC | PRN
  Administered 2021-05-14: 2.5 mg via INTRAVENOUS
  Administered 2021-05-18: 5 mg via INTRAVENOUS
  Filled 2021-05-08 (×2): qty 5

## 2021-05-08 MED ORDER — ~~LOC~~ CARDIAC SURGERY, PATIENT & FAMILY EDUCATION
Freq: Once | Status: DC
Start: 2021-05-08 — End: 2021-05-08
  Filled 2021-05-08: qty 1

## 2021-05-08 MED ORDER — METOPROLOL TARTRATE 12.5 MG HALF TABLET
12.5000 mg | ORAL_TABLET | Freq: Two times a day (BID) | ORAL | Status: DC
  Administered 2021-05-09: 12.5 mg via ORAL
  Filled 2021-05-08: qty 1

## 2021-05-08 MED ORDER — THROMBIN 5000 UNITS EX SOLR
INTRAVENOUS | Status: DC | PRN
  Administered 2021-05-08: 1 mL

## 2021-05-08 MED ORDER — 0.9 % SODIUM CHLORIDE (POUR BTL) OPTIME
TOPICAL | Status: DC | PRN
  Administered 2021-05-08: 5000 mL

## 2021-05-08 MED ORDER — MOMETASONE FURO-FORMOTEROL FUM 200-5 MCG/ACT IN AERO
2.0000 | INHALATION_SPRAY | Freq: Two times a day (BID) | RESPIRATORY_TRACT | Status: DC
  Administered 2021-05-09 – 2021-05-21 (×23): 2 via RESPIRATORY_TRACT
  Filled 2021-05-08: qty 8.8

## 2021-05-08 MED ORDER — ADULT MULTIVITAMIN W/MINERALS CH
1.0000 | ORAL_TABLET | Freq: Every day | ORAL | Status: DC
  Administered 2021-05-09 – 2021-05-20 (×11): 1 via ORAL
  Filled 2021-05-08 (×12): qty 1

## 2021-05-08 MED ORDER — INSULIN REGULAR(HUMAN) IN NACL 100-0.9 UT/100ML-% IV SOLN: INTRAVENOUS | Status: DC

## 2021-05-08 MED ORDER — PLATELET RICH PLASMA OPTIME
Status: DC | PRN
  Administered 2021-05-08: 10 mL

## 2021-05-08 MED ORDER — ACETAMINOPHEN 500 MG PO TABS
1000.0000 mg | ORAL_TABLET | Freq: Four times a day (QID) | ORAL | Status: AC
  Administered 2021-05-09 – 2021-05-13 (×15): 1000 mg via ORAL
  Filled 2021-05-08 (×17): qty 2

## 2021-05-08 MED ORDER — LEVALBUTEROL TARTRATE 45 MCG/ACT IN AERO: 2.0000 | INHALATION_SPRAY | Freq: Four times a day (QID) | RESPIRATORY_TRACT | Status: DC

## 2021-05-08 MED ORDER — SODIUM CHLORIDE 0.9 % IV SOLN: 250.0000 mL | INTRAVENOUS | Status: DC

## 2021-05-08 MED ORDER — SODIUM CHLORIDE 0.9% FLUSH: 3.0000 mL | INTRAVENOUS | Status: DC | PRN

## 2021-05-08 MED ORDER — FENTANYL CITRATE (PF) 250 MCG/5ML IJ SOLN
INTRAMUSCULAR | Status: AC
  Filled 2021-05-08: qty 25

## 2021-05-08 MED ORDER — SODIUM CHLORIDE 0.45 % IV SOLN: INTRAVENOUS | Status: DC | PRN

## 2021-05-08 MED ORDER — PHENYLEPHRINE HCL-NACL 20-0.9 MG/250ML-% IV SOLN
0.0000 ug/min | INTRAVENOUS | Status: DC
  Administered 2021-05-08: 40 ug/min via INTRAVENOUS
  Administered 2021-05-09: 35 ug/min via INTRAVENOUS
  Administered 2021-05-09: 40 ug/min via INTRAVENOUS
  Administered 2021-05-09: 70 ug/min via INTRAVENOUS
  Administered 2021-05-10: 40 ug/min via INTRAVENOUS
  Administered 2021-05-10: 20 ug/min via INTRAVENOUS
  Administered 2021-05-11: 40 ug/min via INTRAVENOUS
  Filled 2021-05-08 (×7): qty 250

## 2021-05-08 MED ORDER — HEPARIN SODIUM (PORCINE) 1000 UNIT/ML IJ SOLN
INTRAMUSCULAR | Status: DC | PRN
  Administered 2021-05-08: 27000 [IU] via INTRAVENOUS

## 2021-05-08 MED ORDER — STERILE WATER FOR INJECTION IV SOLN
INTRAVENOUS | Status: DC | PRN
  Administered 2021-05-08: 30 mL

## 2021-05-08 MED ORDER — ACETAMINOPHEN 650 MG RE SUPP
650.0000 mg | Freq: Once | RECTAL | Status: AC
  Administered 2021-05-08: 650 mg via RECTAL
  Filled 2021-05-08: qty 1

## 2021-05-08 MED ORDER — DOCUSATE SODIUM 100 MG PO CAPS
200.0000 mg | ORAL_CAPSULE | Freq: Every day | ORAL | Status: DC
  Administered 2021-05-09 – 2021-05-11 (×3): 200 mg via ORAL
  Filled 2021-05-08 (×3): qty 2

## 2021-05-08 MED ORDER — VANCOMYCIN HCL 1000 MG IV SOLR
INTRAVENOUS | Status: AC
  Filled 2021-05-08: qty 3000

## 2021-05-08 MED ORDER — PLASMA-LYTE A IV SOLN: INTRAVENOUS | Status: DC

## 2021-05-08 MED ORDER — LACTATED RINGERS IV SOLN: INTRAVENOUS | Status: DC | PRN

## 2021-05-08 MED ORDER — METOPROLOL TARTRATE 25 MG/10 ML ORAL SUSPENSION: 12.5000 mg | Freq: Two times a day (BID) | ORAL | Status: DC

## 2021-05-08 MED ORDER — POTASSIUM CHLORIDE 10 MEQ/50ML IV SOLN: 10.0000 meq | INTRAVENOUS | Status: AC

## 2021-05-08 MED ORDER — LEVALBUTEROL HCL 0.63 MG/3ML IN NEBU
INHALATION_SOLUTION | RESPIRATORY_TRACT | Status: AC
  Administered 2021-05-08: 0.63 mg
  Filled 2021-05-08: qty 3

## 2021-05-08 MED ORDER — FENTANYL CITRATE (PF) 100 MCG/2ML IJ SOLN
INTRAMUSCULAR | Status: DC | PRN
  Administered 2021-05-08: 50 ug via INTRAVENOUS
  Administered 2021-05-08: 150 ug via INTRAVENOUS
  Administered 2021-05-08: 50 ug via INTRAVENOUS
  Administered 2021-05-08: 150 ug via INTRAVENOUS
  Administered 2021-05-08: 250 ug via INTRAVENOUS
  Administered 2021-05-08: 100 ug via INTRAVENOUS
  Administered 2021-05-08: 200 ug via INTRAVENOUS

## 2021-05-08 MED ORDER — OXYCODONE HCL 5 MG PO TABS
5.0000 mg | ORAL_TABLET | ORAL | Status: DC | PRN
Start: 2021-05-08 — End: 2021-05-15
  Administered 2021-05-09 – 2021-05-14 (×9): 5 mg via ORAL
  Filled 2021-05-08 (×9): qty 1

## 2021-05-08 MED ORDER — ROCURONIUM BROMIDE 10 MG/ML (PF) SYRINGE
PREFILLED_SYRINGE | INTRAVENOUS | Status: AC
  Filled 2021-05-08: qty 10

## 2021-05-08 MED ORDER — SODIUM CHLORIDE (PF) 0.9 % IJ SOLN
OROMUCOSAL | Status: DC | PRN
  Administered 2021-05-08: 4 mL via TOPICAL

## 2021-05-08 MED ORDER — DEXTROSE 50 % IV SOLN
0.0000 mL | INTRAVENOUS | Status: DC | PRN
Start: 2021-05-08 — End: 2021-05-21
  Administered 2021-05-08: 25 mL via INTRAVENOUS
  Filled 2021-05-08: qty 50

## 2021-05-08 MED ORDER — SODIUM CHLORIDE 0.9% FLUSH
10.0000 mL | Freq: Two times a day (BID) | INTRAVENOUS | Status: DC
Start: 2021-05-08 — End: 2021-05-21
  Administered 2021-05-08 – 2021-05-21 (×7): 10 mL

## 2021-05-08 MED ORDER — PROPOFOL 10 MG/ML IV BOLUS
INTRAVENOUS | Status: DC | PRN
  Administered 2021-05-08: 50 mg via INTRAVENOUS

## 2021-05-08 MED ORDER — PHENYLEPHRINE HCL (PRESSORS) 10 MG/ML IV SOLN
INTRAVENOUS | Status: AC
  Filled 2021-05-08: qty 2

## 2021-05-08 MED ORDER — SODIUM CHLORIDE 0.9 % IV SOLN: INTRAVENOUS | Status: DC | PRN

## 2021-05-08 MED ORDER — TAMSULOSIN HCL 0.4 MG PO CAPS
0.4000 mg | ORAL_CAPSULE | Freq: Every morning | ORAL | Status: DC
  Administered 2021-05-09 – 2021-05-21 (×12): 0.4 mg via ORAL
  Filled 2021-05-08 (×15): qty 1

## 2021-05-08 MED ORDER — CHLORHEXIDINE GLUCONATE CLOTH 2 % EX PADS
6.0000 | MEDICATED_PAD | Freq: Every day | CUTANEOUS | Status: DC
  Administered 2021-05-09: 6 via TOPICAL

## 2021-05-08 MED ORDER — ROCURONIUM BROMIDE 10 MG/ML (PF) SYRINGE
PREFILLED_SYRINGE | INTRAVENOUS | Status: DC | PRN
  Administered 2021-05-08: 30 mg via INTRAVENOUS
  Administered 2021-05-08: 70 mg via INTRAVENOUS
  Administered 2021-05-08 (×2): 50 mg via INTRAVENOUS

## 2021-05-08 MED ORDER — LACTATED RINGERS IV SOLN: 500.0000 mL | Freq: Once | INTRAVENOUS | Status: DC | PRN

## 2021-05-08 MED ORDER — ACETAMINOPHEN 160 MG/5ML PO SOLN: 1000.0000 mg | Freq: Four times a day (QID) | ORAL | Status: AC

## 2021-05-08 MED ORDER — EPHEDRINE SULFATE-NACL 50-0.9 MG/10ML-% IV SOSY
PREFILLED_SYRINGE | INTRAVENOUS | Status: DC | PRN
  Administered 2021-05-08: 5 mg via INTRAVENOUS
  Administered 2021-05-08 (×3): 10 mg via INTRAVENOUS

## 2021-05-08 MED ORDER — PROPOFOL 10 MG/ML IV BOLUS
INTRAVENOUS | Status: AC
  Filled 2021-05-08: qty 20

## 2021-05-08 MED ORDER — METOPROLOL TARTRATE 12.5 MG HALF TABLET
12.5000 mg | ORAL_TABLET | Freq: Once | ORAL | Status: AC
  Administered 2021-05-08: 12.5 mg via ORAL
  Filled 2021-05-08: qty 1

## 2021-05-08 MED ORDER — STERILE WATER FOR INJECTION IJ SOLN
INTRAMUSCULAR | Status: DC | PRN
  Administered 2021-05-08: 10 mL via INTRAMUSCULAR

## 2021-05-08 MED ORDER — TRAMADOL HCL 50 MG PO TABS
50.0000 mg | ORAL_TABLET | ORAL | Status: DC | PRN
  Administered 2021-05-10 – 2021-05-19 (×8): 50 mg via ORAL
  Filled 2021-05-08 (×9): qty 1

## 2021-05-08 MED ORDER — HEMOSTATIC AGENTS (NO CHARGE) OPTIME
TOPICAL | Status: DC | PRN
  Administered 2021-05-08: 1 via TOPICAL

## 2021-05-08 MED ORDER — CHLORHEXIDINE GLUCONATE 0.12 % MT SOLN
15.0000 mL | Freq: Once | OROMUCOSAL | Status: AC
  Administered 2021-05-08: 15 mL via OROMUCOSAL
  Filled 2021-05-08: qty 15

## 2021-05-08 MED ORDER — BUPIVACAINE LIPOSOME 1.3 % IJ SUSP
INTRAMUSCULAR | Status: DC | PRN
  Administered 2021-05-08: 50 mL

## 2021-05-08 MED ORDER — VANCOMYCIN HCL IN DEXTROSE 1-5 GM/200ML-% IV SOLN
1000.0000 mg | Freq: Once | INTRAVENOUS | Status: AC
  Administered 2021-05-08: 1000 mg via INTRAVENOUS
  Filled 2021-05-08: qty 200

## 2021-05-08 MED ORDER — BUPIVACAINE HCL (PF) 0.5 % IJ SOLN
INTRAMUSCULAR | Status: AC
  Filled 2021-05-08: qty 30

## 2021-05-08 MED ORDER — PLASMA-LYTE A IV SOLN
INTRAVENOUS | Status: DC | PRN
  Administered 2021-05-08: 200 mL via INTRAVASCULAR

## 2021-05-08 MED ORDER — MIDAZOLAM HCL (PF) 5 MG/ML IJ SOLN
INTRAMUSCULAR | Status: DC | PRN
  Administered 2021-05-08: 2 mg via INTRAVENOUS
  Administered 2021-05-08: 1 mg via INTRAVENOUS
  Administered 2021-05-08: 2 mg via INTRAVENOUS
  Administered 2021-05-08: 1 mg via INTRAVENOUS
  Administered 2021-05-08: 2 mg via INTRAVENOUS

## 2021-05-08 MED ORDER — STERILE WATER FOR INJECTION IJ SOLN
INTRAMUSCULAR | Status: AC
  Filled 2021-05-08: qty 10

## 2021-05-08 MED ORDER — PROTAMINE SULFATE 10 MG/ML IV SOLN
INTRAVENOUS | Status: AC
  Filled 2021-05-08: qty 5

## 2021-05-08 MED ORDER — ONDANSETRON HCL 4 MG/2ML IJ SOLN
4.0000 mg | Freq: Four times a day (QID) | INTRAMUSCULAR | Status: DC | PRN
  Administered 2021-05-09 – 2021-05-14 (×6): 4 mg via INTRAVENOUS
  Filled 2021-05-08 (×6): qty 2

## 2021-05-08 MED ORDER — MORPHINE SULFATE (PF) 2 MG/ML IV SOLN
1.0000 mg | INTRAVENOUS | Status: DC | PRN
  Administered 2021-05-08 – 2021-05-10 (×6): 2 mg via INTRAVENOUS
  Filled 2021-05-08 (×6): qty 1

## 2021-05-08 MED ORDER — LEVALBUTEROL HCL 1.25 MG/0.5ML IN NEBU
1.2500 mg | INHALATION_SOLUTION | Freq: Four times a day (QID) | RESPIRATORY_TRACT | Status: DC
  Administered 2021-05-08 – 2021-05-09 (×2): 1.25 mg via RESPIRATORY_TRACT
  Filled 2021-05-08 (×2): qty 0.5

## 2021-05-08 MED ORDER — LACTATED RINGERS IV SOLN: INTRAVENOUS | Status: DC

## 2021-05-08 MED ORDER — ARTIFICIAL TEARS OPHTHALMIC OINT
TOPICAL_OINTMENT | OPHTHALMIC | Status: DC | PRN
  Administered 2021-05-08: 1 via OPHTHALMIC

## 2021-05-08 MED ORDER — CHLORHEXIDINE GLUCONATE CLOTH 2 % EX PADS
6.0000 | MEDICATED_PAD | Freq: Every day | CUTANEOUS | Status: DC
  Administered 2021-05-08 – 2021-05-20 (×9): 6 via TOPICAL

## 2021-05-08 MED ORDER — SODIUM CHLORIDE 0.9% FLUSH
3.0000 mL | Freq: Two times a day (BID) | INTRAVENOUS | Status: DC
  Administered 2021-05-09 – 2021-05-21 (×16): 3 mL via INTRAVENOUS

## 2021-05-08 MED ORDER — LIDOCAINE 2% (20 MG/ML) 5 ML SYRINGE
INTRAMUSCULAR | Status: DC | PRN
  Administered 2021-05-08: 60 mg via INTRAVENOUS

## 2021-05-08 MED ORDER — CHLORHEXIDINE GLUCONATE 4 % EX LIQD: 30.0000 mL | CUTANEOUS | Status: DC

## 2021-05-08 MED ORDER — MIDAZOLAM HCL 2 MG/2ML IJ SOLN: 2.0000 mg | INTRAMUSCULAR | Status: DC | PRN

## 2021-05-08 MED ORDER — SODIUM CHLORIDE 0.9% FLUSH
10.0000 mL | INTRAVENOUS | Status: DC | PRN
Start: 2021-05-08 — End: 2021-05-11

## 2021-05-08 MED ORDER — ACETAMINOPHEN 500 MG PO TABS: 1000.0000 mg | ORAL_TABLET | Freq: Once | ORAL | Status: DC

## 2021-05-08 MED ORDER — CEFAZOLIN SODIUM-DEXTROSE 2-4 GM/100ML-% IV SOLN
2.0000 g | Freq: Three times a day (TID) | INTRAVENOUS | Status: AC
  Administered 2021-05-08 – 2021-05-10 (×6): 2 g via INTRAVENOUS
  Filled 2021-05-08 (×7): qty 100

## 2021-05-08 MED ORDER — BISACODYL 10 MG RE SUPP: 10.0000 mg | Freq: Every day | RECTAL | Status: DC

## 2021-05-08 MED ORDER — DEXMEDETOMIDINE HCL IN NACL 400 MCG/100ML IV SOLN: 0.0000 ug/kg/h | INTRAVENOUS | Status: DC

## 2021-05-08 MED ORDER — PHENYLEPHRINE 40 MCG/ML (10ML) SYRINGE FOR IV PUSH (FOR BLOOD PRESSURE SUPPORT)
PREFILLED_SYRINGE | INTRAVENOUS | Status: DC | PRN
  Administered 2021-05-08: 120 ug via INTRAVENOUS
  Administered 2021-05-08 (×2): 80 ug via INTRAVENOUS
  Administered 2021-05-08 (×3): 120 ug via INTRAVENOUS

## 2021-05-08 MED ORDER — SODIUM CHLORIDE 0.9 % IV SOLN: INTRAVENOUS | Status: DC

## 2021-05-08 MED ORDER — BUPIVACAINE LIPOSOME 1.3 % IJ SUSP
INTRAMUSCULAR | Status: AC
  Filled 2021-05-08: qty 20

## 2021-05-08 MED ORDER — ALBUMIN HUMAN 5 % IV SOLN: INTRAVENOUS | Status: DC | PRN

## 2021-05-08 MED ORDER — VANCOMYCIN HCL 1000 MG IV SOLR
INTRAVENOUS | Status: DC | PRN
  Administered 2021-05-08: 3 g

## 2021-05-08 MED ORDER — HEPARIN SODIUM (PORCINE) 1000 UNIT/ML IJ SOLN
INTRAMUSCULAR | Status: AC
  Filled 2021-05-08: qty 1

## 2021-05-08 MED ORDER — ATORVASTATIN CALCIUM 80 MG PO TABS
80.0000 mg | ORAL_TABLET | Freq: Every day | ORAL | Status: DC
  Administered 2021-05-09 – 2021-05-21 (×13): 80 mg via ORAL
  Filled 2021-05-08 (×13): qty 1

## 2021-05-08 MED ORDER — ASPIRIN 81 MG PO CHEW: 324.0000 mg | CHEWABLE_TABLET | Freq: Every day | ORAL | Status: DC

## 2021-05-08 MED ORDER — MAGNESIUM SULFATE 4 GM/100ML IV SOLN
4.0000 g | Freq: Once | INTRAVENOUS | Status: AC
  Administered 2021-05-08: 4 g via INTRAVENOUS
  Filled 2021-05-08: qty 100

## 2021-05-08 MED ORDER — PANTOPRAZOLE SODIUM 40 MG PO TBEC
40.0000 mg | DELAYED_RELEASE_TABLET | Freq: Every day | ORAL | Status: DC
  Administered 2021-05-10 – 2021-05-21 (×12): 40 mg via ORAL
  Filled 2021-05-08 (×12): qty 1

## 2021-05-08 MED ORDER — CHLORHEXIDINE GLUCONATE 0.12 % MT SOLN
15.0000 mL | OROMUCOSAL | Status: AC
  Administered 2021-05-08: 15 mL via OROMUCOSAL

## 2021-05-08 MED ORDER — ACETAMINOPHEN 160 MG/5ML PO SOLN
650.0000 mg | Freq: Once | ORAL | Status: AC
Start: 2021-05-08 — End: 2021-05-08

## 2021-05-08 MED ORDER — PLATELET POOR PLASMA OPTIME
Status: DC | PRN
  Administered 2021-05-08: 10 mL

## 2021-05-08 MED ORDER — PROTAMINE SULFATE 10 MG/ML IV SOLN
INTRAVENOUS | Status: DC | PRN
  Administered 2021-05-08: 270 mg via INTRAVENOUS

## 2021-05-08 MED ORDER — MIDAZOLAM HCL (PF) 10 MG/2ML IJ SOLN
INTRAMUSCULAR | Status: AC
  Filled 2021-05-08: qty 2

## 2021-05-08 MED ORDER — PROTAMINE SULFATE 10 MG/ML IV SOLN
INTRAVENOUS | Status: AC
  Filled 2021-05-08: qty 25

## 2021-05-08 MED ORDER — BISACODYL 5 MG PO TBEC
10.0000 mg | DELAYED_RELEASE_TABLET | Freq: Every day | ORAL | Status: DC
  Administered 2021-05-09 – 2021-05-11 (×3): 10 mg via ORAL
  Filled 2021-05-08 (×3): qty 2

## 2021-05-08 SURGICAL SUPPLY — 97 items
ADAPTER CARDIO PERF ANTE/RETRO (ADAPTER) ×5 IMPLANT
APPLICATOR TIP COSEAL (VASCULAR PRODUCTS) IMPLANT
ATRICLIP EXCLUSION VLAA 45 (Miscellaneous) ×5 IMPLANT
BAG COUNTER SPONGE SURGICOUNT (BAG) ×15 IMPLANT
BAG DECANTER FOR FLEXI CONT (MISCELLANEOUS) ×5 IMPLANT
BLADE CLIPPER SURG (BLADE) ×5 IMPLANT
BLADE STERNUM SYSTEM 6 (BLADE) ×5 IMPLANT
BLADE SURG 11 STRL SS (BLADE) ×5 IMPLANT
BNDG ELASTIC 4X5.8 VLCR STR LF (GAUZE/BANDAGES/DRESSINGS) ×5 IMPLANT
BNDG ELASTIC 6X10 VLCR STRL LF (GAUZE/BANDAGES/DRESSINGS) ×5 IMPLANT
BNDG ELASTIC 6X5.8 VLCR STR LF (GAUZE/BANDAGES/DRESSINGS) ×5 IMPLANT
BNDG GAUZE ELAST 4 BULKY (GAUZE/BANDAGES/DRESSINGS) ×5 IMPLANT
CANISTER SUCT 3000ML PPV (MISCELLANEOUS) ×5 IMPLANT
CANNULA NON VENT 20FR 12 (CANNULA) ×5 IMPLANT
CATH CPB KIT HENDRICKSON (MISCELLANEOUS) ×5 IMPLANT
CATH ROBINSON RED A/P 18FR (CATHETERS) ×10 IMPLANT
CLIP RETRACTION 3.0MM CORONARY (MISCELLANEOUS) ×5 IMPLANT
CLIP VESOCCLUDE SM WIDE 24/CT (CLIP) IMPLANT
CONN ST 1/4X3/8  BEN (MISCELLANEOUS) ×1
CONN ST 1/4X3/8 BEN (MISCELLANEOUS) ×4 IMPLANT
CONTAINER PROTECT SURGISLUSH (MISCELLANEOUS) ×10 IMPLANT
DERMABOND ADVANCED (GAUZE/BANDAGES/DRESSINGS) ×1
DERMABOND ADVANCED .7 DNX12 (GAUZE/BANDAGES/DRESSINGS) ×4 IMPLANT
DEVICE EXCLUSIN ATRCLP VLAA 45 (Miscellaneous) ×4 IMPLANT
DRAIN CHANNEL 28F RND 3/8 FF (WOUND CARE) ×15 IMPLANT
DRAPE CARDIOVASCULAR INCISE (DRAPES) ×1
DRAPE SRG 135X102X78XABS (DRAPES) ×4 IMPLANT
DRAPE WARM FLUID 44X44 (DRAPES) ×5 IMPLANT
DRSG AQUACEL AG ADV 3.5X14 (GAUZE/BANDAGES/DRESSINGS) ×5 IMPLANT
ELECT CAUTERY BLADE 6.4 (BLADE) ×5 IMPLANT
ELECT REM PT RETURN 9FT ADLT (ELECTROSURGICAL) ×10
ELECTRODE REM PT RTRN 9FT ADLT (ELECTROSURGICAL) ×8 IMPLANT
FELT TEFLON 1X6 (MISCELLANEOUS) ×5 IMPLANT
GAUZE SPONGE 4X4 12PLY STRL (GAUZE/BANDAGES/DRESSINGS) ×10 IMPLANT
GLOVE SRG 8 PF TXTR STRL LF DI (GLOVE) ×4 IMPLANT
GLOVE SURG MICRO LTX SZ6.5 (GLOVE) ×20 IMPLANT
GLOVE SURG NEOP MICRO LF SZ7.5 (GLOVE) ×15 IMPLANT
GLOVE SURG POLYISO LF SZ6.5 (GLOVE) ×5 IMPLANT
GLOVE SURG UNDER POLY LF SZ6.5 (GLOVE) ×30 IMPLANT
GLOVE SURG UNDER POLY LF SZ8 (GLOVE) ×1
GOWN STRL REUS W/ TWL LRG LVL3 (GOWN DISPOSABLE) ×24 IMPLANT
GOWN STRL REUS W/TWL LRG LVL3 (GOWN DISPOSABLE) ×6
HEMOSTAT POWDER SURGIFOAM 1G (HEMOSTASIS) IMPLANT
INSERT FOGARTY XLG (MISCELLANEOUS) ×5 IMPLANT
INSERT SUTURE HOLDER (MISCELLANEOUS) ×5 IMPLANT
KIT APPLICATOR RATIO 11:1 (KITS) ×5 IMPLANT
KIT BASIN OR (CUSTOM PROCEDURE TRAY) ×5 IMPLANT
KIT SUCTION CATH 14FR (SUCTIONS) ×5 IMPLANT
KIT TURNOVER KIT B (KITS) ×5 IMPLANT
KIT VASOVIEW HEMOPRO 2 VH 4000 (KITS) ×5 IMPLANT
MARKER GRAFT CORONARY BYPASS (MISCELLANEOUS) ×15 IMPLANT
NEEDLE 18GX1X1/2 (RX/OR ONLY) (NEEDLE) ×5 IMPLANT
NS IRRIG 1000ML POUR BTL (IV SOLUTION) ×25 IMPLANT
PACK E OPEN HEART (SUTURE) ×5 IMPLANT
PACK OPEN HEART (CUSTOM PROCEDURE TRAY) ×5 IMPLANT
PACK PLATELET PROCEDURE 60 (MISCELLANEOUS) ×5 IMPLANT
PACK SPY-PHI (KITS) ×5 IMPLANT
PAD ARMBOARD 7.5X6 YLW CONV (MISCELLANEOUS) ×10 IMPLANT
PAD ELECT DEFIB RADIOL ZOLL (MISCELLANEOUS) ×5 IMPLANT
PENCIL BUTTON HOLSTER BLD 10FT (ELECTRODE) ×5 IMPLANT
POSITIONER HEAD DONUT 9IN (MISCELLANEOUS) ×5 IMPLANT
POWDER SURGICEL 3.0 GRAM (HEMOSTASIS) ×5 IMPLANT
PUNCH AORTIC ROTATE 4.5MM 8IN (MISCELLANEOUS) ×5 IMPLANT
SEALANT SURG COSEAL 4ML (VASCULAR PRODUCTS) ×5 IMPLANT
SEALANT SURG COSEAL 8ML (VASCULAR PRODUCTS) IMPLANT
SET MPS 3-ND DEL (MISCELLANEOUS) ×5 IMPLANT
SUT BONE WAX W31G (SUTURE) ×5 IMPLANT
SUT MNCRL AB 3-0 PS2 18 (SUTURE) ×10 IMPLANT
SUT MNCRL AB 4-0 PS2 18 (SUTURE) ×5 IMPLANT
SUT PDS AB 1 CTX 36 (SUTURE) ×10 IMPLANT
SUT PROLENE 3 0 SH DA (SUTURE) ×5 IMPLANT
SUT PROLENE 5 0 C 1 36 (SUTURE) IMPLANT
SUT PROLENE 6 0 C 1 30 (SUTURE) ×15 IMPLANT
SUT PROLENE 8 0 BV175 6 (SUTURE) IMPLANT
SUT PROLENE BLUE 7 0 (SUTURE) ×5 IMPLANT
SUT SILK 2 0 SH CR/8 (SUTURE) IMPLANT
SUT SILK 3 0 SH CR/8 (SUTURE) IMPLANT
SUT STEEL 6MS V (SUTURE) ×5 IMPLANT
SUT STEEL SZ 6 DBL 3X14 BALL (SUTURE) ×5 IMPLANT
SUT VIC AB 2-0 CT1 27 (SUTURE) ×1
SUT VIC AB 2-0 CT1 TAPERPNT 27 (SUTURE) ×4 IMPLANT
SUT VIC AB 2-0 CTX 27 (SUTURE) IMPLANT
SUT VIC AB 3-0 X1 27 (SUTURE) IMPLANT
SYR 10ML LL (SYRINGE) IMPLANT
SYR 30ML LL (SYRINGE) ×5 IMPLANT
SYR 3ML LL SCALE MARK (SYRINGE) IMPLANT
SYSTEM SAHARA CHEST DRAIN ATS (WOUND CARE) ×5 IMPLANT
TAPE CLOTH SURG 4X10 WHT LF (GAUZE/BANDAGES/DRESSINGS) ×10 IMPLANT
TAPE PAPER 3X10 WHT MICROPORE (GAUZE/BANDAGES/DRESSINGS) ×5 IMPLANT
TIP DUAL SPRAY TOPICAL (TIP) ×5 IMPLANT
TOWEL GREEN STERILE (TOWEL DISPOSABLE) ×5 IMPLANT
TOWEL GREEN STERILE FF (TOWEL DISPOSABLE) ×5 IMPLANT
TRAY FOLEY SLVR 16FR TEMP STAT (SET/KITS/TRAYS/PACK) ×5 IMPLANT
TUBING LAP HI FLOW INSUFFLATIO (TUBING) ×5 IMPLANT
UNDERPAD 30X36 HEAVY ABSORB (UNDERPADS AND DIAPERS) ×5 IMPLANT
WATER STERILE IRR 1000ML POUR (IV SOLUTION) ×10 IMPLANT
WATER STERILE IRR 1000ML UROMA (IV SOLUTION) IMPLANT

## 2021-05-08 NOTE — Progress Notes (Signed)
Patient ID: Robert Bates, male   DOB: 1941/02/21, 80 y.o.   MRN: [id number]  TCTS Evening Rounds:   Hemodynamically stable  CI = 2.4  Just extubated.  Urine output good  CT ok. Had 280 with dangle but 100 cc last hr. It is thin bloody fluid without clots.  CBC    Component Value Date/Time   WBC 14.5 (H) 05/08/2021 1231   RBC 2.89 (L) 05/08/2021 1231   HGB 9.0 (L) 05/08/2021 1231   HGB 14.0 06/20/2020 1405   HGB 14.7 06/17/2017 1315   HGB 14.8 08/17/2015 1318   HCT 27.6 (L) 05/08/2021 1231   HCT 43.8 06/17/2017 1315   HCT 44.4 08/17/2015 1318   PLT 146 (L) 05/08/2021 1231   PLT 229 06/20/2020 1405   PLT 230 06/17/2017 1315   PLT 276 08/17/2015 1318   MCV 95.5 05/08/2021 1231   MCV 93 06/17/2017 1315   MCV 91.9 08/17/2015 1318   MCH 31.1 05/08/2021 1231   MCHC 32.6 05/08/2021 1231   RDW 13.9 05/08/2021 1231   RDW 13.5 06/17/2017 1315   RDW 13.3 08/17/2015 1318   LYMPHSABS 1.3 06/20/2020 1405   LYMPHSABS 1.2 06/17/2017 1315   LYMPHSABS 2.6 08/17/2015 1318   MONOABS 0.7 06/20/2020 1405   MONOABS 0.8 08/17/2015 1318   EOSABS 0.1 06/20/2020 1405   EOSABS 0.2 06/17/2017 1315   BASOSABS 0.0 06/20/2020 1405   BASOSABS 0.0 06/17/2017 1315   BASOSABS 0.1 08/17/2015 1318     BMET    Component Value Date/Time   NA 141 05/08/2021 1115   NA 142 03/12/2021 1618   NA 140 06/17/2017 1315   K 4.3 05/08/2021 1115   K 4.5 06/17/2017 1315   CL 102 05/08/2021 1108   CO2 22 05/04/2021 1430   CO2 28 06/17/2017 1315   GLUCOSE 133 (H) 05/08/2021 1108   GLUCOSE 95 06/17/2017 1315   BUN 15 05/08/2021 1108   BUN 20 03/12/2021 1618   BUN 18.1 06/17/2017 1315   CREATININE 0.60 (L) 05/08/2021 1108   CREATININE 0.84 12/08/2020 1609   CREATININE 0.8 06/17/2017 1315   CALCIUM 9.2 05/04/2021 1430   CALCIUM 9.8 06/17/2017 1315   GFRNONAA >60 05/04/2021 1430   GFRNONAA >60 06/20/2020 1405   GFRAA >60 06/20/2020 1405     A/P:  Stable postop course. Continue current plans

## 2021-05-08 NOTE — Anesthesia Postprocedure Evaluation (Signed)
Anesthesia Post Note  Patient: Robert Bates  Procedure(s) Performed: CORONARY ARTERY BYPASS GRAFTING (CABG)X 3 USING LEFT INTERNAL MAMMARY ARTERY AND RIGHT GREATER SAPEHNOUS VEIN (Chest) INDOCYANINE GREEN FLUORESCENCE IMAGING (ICG) TRANSESOPHAGEAL ECHOCARDIOGRAM (TEE) CLIPPING OF ATRIAL APPENDAGE USING 45 ATRICLIP (Left) APPLICATION OF CELL SAVER ENDOVEIN HARVEST OF GREATER SAPHENOUS VEIN (Right)     Patient location during evaluation: SICU Anesthesia Type: General Level of consciousness: sedated Pain management: pain level controlled Vital Signs Assessment: post-procedure vital signs reviewed and stable Respiratory status: patient remains intubated per anesthesia plan Cardiovascular status: stable Postop Assessment: no apparent nausea or vomiting Anesthetic complications: no   No notable events documented.  Last Vitals:  Vitals:   05/08/21 1600 05/08/21 1615  BP: 102/73   Pulse: 79 92  Resp: 17 18  Temp: 36.9 C 36.9 C  SpO2: 100% 99%    Last Pain:  Vitals:   05/08/21 1600  TempSrc: Core  PainSc: 0-No pain                 Tiajuana Amass

## 2021-05-08 NOTE — Anesthesia Procedure Notes (Addendum)
Procedure Name: Intubation Date/Time: 05/08/2021 7:55 AM Performed by: Dorthea Cove, CRNA Pre-anesthesia Checklist: Patient identified, Emergency Drugs available, Suction available and Patient being monitored Patient Re-evaluated:Patient Re-evaluated prior to induction Oxygen Delivery Method: Circle system utilized Preoxygenation: Pre-oxygenation with 100% oxygen Induction Type: IV induction Ventilation: Oral airway inserted - appropriate to patient size, Two handed mask ventilation required and Mask ventilation without difficulty Laryngoscope Size: Mac and 4 Grade View: Grade II Tube type: Oral Tube size: 8.0 mm Number of attempts: 1 Airway Equipment and Method: Stylet and Oral airway Placement Confirmation: ETT inserted through vocal cords under direct vision, positive ETCO2 and breath sounds checked- equal and bilateral Secured at: 22 cm Tube secured with: Tape Dental Injury: Teeth and Oropharynx as per pre-operative assessment

## 2021-05-08 NOTE — Progress Notes (Signed)
Patient's foley catheter leaking despite interventions. Foley removed and replaced per MD.

## 2021-05-08 NOTE — Progress Notes (Signed)
1600: Atkins MD paged in regards to CT output 280 mL with turning for bath.  1700: Bartle MD rounding and notified of patient's inability to reach goal on NIF but did for VC and had a positive cuff leak. Orders received to extubate patient. Patient extubated uneventfully.

## 2021-05-08 NOTE — Anesthesia Procedure Notes (Addendum)
Central Venous Catheter Insertion Performed by: Belinda Block, MD, anesthesiologist Start/End7/03/2021 6:50 AM, 05/08/2021 7:05 AM Patient location: Pre-op. Preanesthetic checklist: patient identified, IV checked, site marked, risks and benefits discussed, surgical consent, monitors and equipment checked, pre-op evaluation, timeout performed and anesthesia consent Position: Trendelenburg Lidocaine 1% used for infiltration and patient sedated Hand hygiene performed , maximum sterile barriers used  and Seldinger technique used PA cath was placed.Swan type:thermodilution Procedure performed without using ultrasound guided technique. Ultrasound Notes:anatomy identified Attempts: 1 Following insertion, line sutured, dressing applied and Biopatch. Post procedure assessment: blood return through all ports  Patient tolerated the procedure well with no immediate complications.

## 2021-05-08 NOTE — Procedures (Signed)
Extubation Procedure Note  Patient Details:   Name: Robert Bates DOB: 1941/10/25 MRN: [id number]   Airway Documentation:    Vent end date: 05/08/21 Vent end time: 1710   Evaluation  O2 sats: stable throughout Complications: No apparent complications Patient did tolerate procedure well. Bilateral Breath Sounds: Clear, Diminished   Yes  [name] 05/08/2021, 5:20 PM

## 2021-05-08 NOTE — Hospital Course (Addendum)
HPI: [name] is a 80 yo male with known history of PAF on Xarelto, H/O Lung Cancer 2 separate primaries treated with radiation, PVD, HLD, chronic diastolic CHF, DM2, and nicotine abuse.  He had been in his usual state of health until May of this year.  He presented to his PCP with complaints of headache and atypical chest pain.   He had experienced a head trauma and being he was on Xarelto they recommended a head CT which showed on evidence of bleeding or acute abnormalities.  He has 2 meningiomas which remained unchanged.  He was referred back to his cardiologist for evaluation of atypical chest pain.  He was evaluated on 03/12/2021 by Kathyrn Drown at which time he admitted to developing left sided chest pain with shortness of breath.  The patient tried to massage his chest, but this did not relieve his symptoms.  He described the pain as a pressure with radiation to his arms, jaw, or back.  He had a previous CT of his chest that showed evidence of coronary calcification.  It was felt due to this repeat CCTA should be obtained.  This was performed on 04/16/2021 and showed flow limiting lesions in the LAD, Circumflex, and diagonal vessel.  It was felt he would require a left heart cath for further evaluation.  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  It was felt coronary bypass grafting would be indicated.  The patient currently denies chest pain currently.  The patient quit smoking cigarettes about 1 year ago.  He continues to smoke cigars.   Patient is still working full time as a Administrator.  His last dose of Xarelto was on 6/15.  The patient isn't very active outside of driving his truck.  He is vaccinated for COVID infection.  He is unsure if he has family history of CAD.  He sometimes has pain in his legs.  He is right handed.   Dr. Orvan Seen discussed the need for coronary artery bypass grafting. Potential risks, benefits, and complications of the surgery were discussed with the  patient and he agreed to proceed with surgery. Pre operative carotid duplex US showed no significant internal carotid artery stenosis bilaterally. Patient underwent a CABG x 3 on 05/08/2021.  Hospital Course: Updated daily in DCP

## 2021-05-08 NOTE — Discharge Instructions (Addendum)
Discharge Instructions:  1. You may shower, please wash incisions daily with soap and water and keep dry.  If you wish to cover wounds with dressing you may do so but please keep clean and change daily.  No tub baths or swimming until incisions have completely healed.  If your incisions become red or develop any drainage please call our office at [phone]  2. No Driving until cleared by Dr. Orvan Seen' office and you are no longer using narcotic pain medications  3. Monitor your weight daily.. Please use the same scale and weigh at same time... If you gain 5-10 lbs in 48 hours with associated lower extremity swelling, please contact our office at [phone]  4. Fever of 101.5 for at least 24 hours with no source, please contact our office at [phone]  5. Activity- up as tolerated, please walk at least 3 times per day.  Avoid strenuous activity, no lifting, pushing, or pulling with your arms over 8-10 lbs for a minimum of 6 weeks  6. If any questions or concerns arise, please do not hesitate to contact our office at [place] on my medicine - XARELTO (Rivaroxaban)  Why was Xarelto prescribed for you? Xarelto was prescribed for you to reduce the risk of a blood clot forming that can cause a stroke if you have a medical condition called atrial fibrillation (a type of irregular heartbeat).  What do you need to know about xarelto ? Take your Xarelto ONCE DAILY at the same time every day with your evening meal. If you have difficulty swallowing the tablet whole, you may crush it and mix in applesauce just prior to taking your dose.  Take Xarelto exactly as prescribed by your doctor and DO NOT stop taking Xarelto without talking to the doctor who prescribed the medication.  Stopping without other stroke prevention medication to take the place of Xarelto may increase your risk of developing a clot that causes a stroke.  Refill your prescription before you run  out.  After discharge, you should have regular check-up appointments with your healthcare provider that is prescribing your Xarelto.  In the future your dose may need to be changed if your kidney function or weight changes by a significant amount.  What do you do if you miss a dose? If you are taking Xarelto ONCE DAILY and you miss a dose, take it as soon as you remember on the same day then continue your regularly scheduled once daily regimen the next day. Do not take two doses of Xarelto at the same time or on the same day.   Important Safety Information A possible side effect of Xarelto is bleeding. You should call your healthcare provider right away if you experience any of the following: Bleeding from an injury or your nose that does not stop. Unusual colored urine (red or dark brown) or unusual colored stools (red or black). Unusual bruising for unknown reasons. A serious fall or if you hit your head (even if there is no bleeding).  Some medicines may interact with Xarelto and might increase your risk of bleeding while on Xarelto. To help avoid this, consult your healthcare provider or pharmacist prior to using any new prescription or non-prescription medications, including herbals, vitamins, non-steroidal anti-inflammatory drugs (NSAIDs) and supplements.  This website has more information on Xarelto: [email or web address]

## 2021-05-08 NOTE — H&P (Signed)
History and Physical Interval Note:  05/08/2021 7:19 AM  Robert Bates  has presented today for surgery, with the diagnosis of CAD.  The various methods of treatment have been discussed with the patient and family. After consideration of risks, benefits and other options for treatment, the patient has consented to  Procedure(s): CORONARY ARTERY BYPASS GRAFTING (CABG) (N/A) INDOCYANINE GREEN FLUORESCENCE IMAGING (ICG) (N/A) TRANSESOPHAGEAL ECHOCARDIOGRAM (TEE) (N/A) CLIPPING OF ATRIAL APPENDAGE (Left) as a surgical intervention.  The patient's history has been reviewed, patient examined, no change in status, stable for surgery.  I have reviewed the patient's chart and labs.  Questions were answered to the patient's satisfaction.     Wonda Olds

## 2021-05-08 NOTE — Progress Notes (Signed)
NIF -20 VC > 10cc/kg

## 2021-05-08 NOTE — Transfer of Care (Signed)
Immediate Anesthesia Transfer of Care Note  Patient: Robert Bates  Procedure(s) Performed: CORONARY ARTERY BYPASS GRAFTING (CABG)X 3 USING LEFT INTERNAL MAMMARY ARTERY AND RIGHT GREATER SAPEHNOUS VEIN (Chest) INDOCYANINE GREEN FLUORESCENCE IMAGING (ICG) TRANSESOPHAGEAL ECHOCARDIOGRAM (TEE) CLIPPING OF ATRIAL APPENDAGE USING 45 ATRICLIP (Left) APPLICATION OF CELL SAVER ENDOVEIN HARVEST OF GREATER SAPHENOUS VEIN (Right)  Patient Location: ICU  Anesthesia Type:General  Level of Consciousness: sedated and Patient remains intubated per anesthesia plan  Airway & Oxygen Therapy: Patient remains intubated per anesthesia plan and Patient placed on Ventilator (see vital sign flow sheet for setting)  Post-op Assessment: Report given to RN and Post -op Vital signs reviewed and stable  Post vital signs: Reviewed and stable  Last Vitals:  Vitals Value Taken Time  BP 118/82 05/08/21 1214  Temp    Pulse 92 05/08/21 1217  Resp 16 05/08/21 1217  SpO2 100 % 05/08/21 1217  Vitals shown include unvalidated device data.  Last Pain:  Vitals:   05/08/21 0608  TempSrc: Oral  PainSc:          Complications: No notable events documented.

## 2021-05-08 NOTE — Op Note (Signed)
CARDIOTHORACIC SURGERY OPERATIVE NOTE  Date of Procedure: 05/08/2021  Preoperative Diagnosis: Left main Coronary Artery Disease; paroxysmal afib  Postoperative Diagnosis: Same  Procedure:   Coronary Artery Bypass Grafting x 3   Left Internal Mammary Artery to Distal Left Anterior Descending Coronary Artery; Saphenous Vein Graft to distal Obtuse Marginal Branch of Left Circumflex Coronary Artery; Saphenous Vein Graft to  Diagonal Branch Coronary Artery; Endoscopic Vein Harvest from right Thigh and Lower Leg Left atrial appendage clipping Completion graft surveillance with indocyanine green fluorescence angiography  Surgeon: B. Murvin Natal, MD  Assistant: Josie Saunders, PA-C  Anesthesia: get  Operative Findings: preserved left ventricular systolic function fair quality left internal mammary artery conduit--small diameter good quality saphenous vein conduit good quality target vessels for grafting    BRIEF CLINICAL NOTE AND INDICATIONS FOR SURGERY  80 yo male presented with atypical CP last month. CT chest showed severe  coronary artery calcifications. This was followed by [facility] showing severe LM CAD. At the time, he was admitted and found to be COVID-19 positive. He has been treated for COVID and now presents for CABG. He has been sx free since hosp discharge.    DETAILS OF THE OPERATIVE PROCEDURE  Preparation:  The patient is brought to the operating room on the above mentioned date and central monitoring was established by the anesthesia team including placement of Swan-Ganz catheter and radial arterial line. The patient is placed in the supine position on the operating table.  Intravenous antibiotics are administered. General endotracheal anesthesia is induced uneventfully. A Foley catheter is placed.  Baseline transesophageal echocardiogram was performed.  Findings were notable for preserved LV function and no significant valve disease  The patient's chest, abdomen, both  groins, and both lower extremities are prepared and draped in a sterile manner. A time out procedure is performed.   Surgical Approach and Conduit Harvest:  A median sternotomy incision was performed and the left internal mammary artery is dissected from the chest wall and prepared for bypass grafting. The left internal mammary artery is notably good quality conduit. Simultaneously, the greater saphenous vein is obtained from the patient's right thigh using endoscopic vein harvest technique. The saphenous vein is notably good quality conduit. After removal of the saphenous vein, the small surgical incisions in the lower extremity are closed with absorbable suture. Following systemic heparinization, the left internal mammary artery was transected distally noted to have excellent flow.   Extracorporeal Cardiopulmonary Bypass and Myocardial Protection:  The pericardium is opened. The ascending aorta is nondiseased in appearance. The ascending aorta and the right atrium are cannulated for cardiopulmonary bypass.  Adequate heparinization is verified.   The entire pre-bypass portion of the operation was notable for stable hemodynamics.  Cardiopulmonary bypass was begun and the surface of the heart is inspected. Distal target vessels are selected for coronary artery bypass grafting. A cardioplegia cannula is placed in the ascending aorta.    The patient is allowed to cool passively to 34C systemic temperature.  The aortic cross clamp is applied and cold blood cardioplegia is delivered initially in an antegrade fashion through the aortic root.The initial cardioplegic arrest is rapid with early diastolic arrest.  Repeat doses of cardioplegia are administered intermittently throughout the entire cross clamp portion of the operation through the aortic root and through subsequently placed vein grafts in order to maintain completely flat electrocardiogram.   Coronary Artery Bypass Grafting:  The distal  obtuse marginal branch of the left circumflex coronary artery was grafted using a reversed  saphenous vein graft in an end-to-side fashion.  At the site of distal anastomosis the target vessel was good quality and measured approximately 1.5 mm in diameter. The  diagonal branch of the left anterior descending coronary artery was grafted using a reversed saphenous vein graft in an end-to-side fashion.  At the site of distal anastomosis the target vessel was good quality and measured approximately 1.5 mm in diameter. The distal left anterior coronary artery was grafted with the left internal mammary artery in an end-to-side fashion.  At the site of distal anastomosis the target vessel was good quality and measured approximately 1.5 mm in diameter. Anastomotic patency and runoff was confirmed with indocyanine green fluorescence imaging (SPY).   All proximal vein graft anastomoses were placed directly to the ascending aorta prior to removal of the aortic cross clamp.  Deairing procedures were performed and the aortic cross clamp was removed.   Procedure Completion:  All proximal and distal coronary anastomoses were inspected for hemostasis and appropriate graft orientation. Epicardial pacing wires are fixed to the right ventricular outflow tract and to the right atrial appendage. The patient is rewarmed to 37C temperature. The patient is weaned and disconnected from cardiopulmonary bypass.  The patient's rhythm at separation from bypass was sinus bradycardia.  The patient was weaned from cardiopulmonary bypass  without any inotropic support.   Followup transesophageal echocardiogram performed after separation from bypass revealed  no changes from the preoperative exam.  The aortic and venous cannula were removed uneventfully. Protamine was administered to reverse the anticoagulation. The mediastinum and pleural space were inspected for hemostasis and irrigated with saline solution. The mediastinum and left  pleural space were drained using fluted chest tubes placed through separate stab incisions inferiorly.  The soft tissues anterior to the aorta were reapproximated loosely. The sternum is closed with double strength sternal wire. The soft tissues anterior to the sternum were closed in multiple layers and the skin is closed with a running subcuticular skin closure.  The post-bypass portion of the operation was notable for stable rhythm and hemodynamics.  No blood products were administered during the operation.   Disposition:  The patient tolerated the procedure well and is transported to the surgical intensive care in stable condition. There are no intraoperative complications. All sponge instrument and needle counts are verified correct at completion of the operation.    Jayme Cloud,  MD 05/08/2021 12:04 PM

## 2021-05-08 NOTE — Progress Notes (Signed)
  Echocardiogram Echocardiogram Transesophageal has been performed.  Robert Bates 05/08/2021, 8:52 AM

## 2021-05-08 NOTE — Anesthesia Procedure Notes (Addendum)
Central Venous Catheter Insertion Performed by: anesthesiologist Start/End7/03/2021 6:50 AM, 05/08/2021 7:05 AM Patient location: Pre-op. Preanesthetic checklist: patient identified, IV checked, site marked, risks and benefits discussed, surgical consent, monitors and equipment checked, pre-op evaluation, timeout performed and anesthesia consent Lidocaine 1% used for infiltration and patient sedated Hand hygiene performed , maximum sterile barriers used  and Seldinger technique used Catheter size: 8.5 Fr Sheath introducer PA Cath depth:46 Procedure performed using ultrasound guided technique. Ultrasound Notes:anatomy identified, needle tip was noted to be adjacent to the nerve/plexus identified, no ultrasound evidence of intravascular and/or intraneural injection and image(s) printed for medical record Attempts: 1 Following insertion, line sutured and dressing applied. Post procedure assessment: blood return through all ports, free fluid flow and no air  Patient tolerated the procedure well with no immediate complications.

## 2021-05-08 NOTE — Anesthesia Procedure Notes (Signed)
Arterial Line Insertion Start/End7/03/2021 7:00 AM, 05/08/2021 7:14 AM Performed by: Dorthea Cove, CRNA, CRNA  Patient location: OR. Preanesthetic checklist: patient identified, IV checked, site marked, risks and benefits discussed, surgical consent, monitors and equipment checked, pre-op evaluation, timeout performed and anesthesia consent Lidocaine 1% used for infiltration Right, radial was placed Catheter size: 20 Fr Hand hygiene performed  and maximum sterile barriers used   Attempts: 1 Procedure performed without using ultrasound guided technique. Following insertion, dressing applied and Biopatch. Post procedure assessment: normal and unchanged  Patient tolerated the procedure well with no immediate complications.

## 2021-05-08 NOTE — Brief Op Note (Addendum)
05/08/2021  10:49 AM  PATIENT:  Robert Bates  80 y.o. male  PRE-OPERATIVE DIAGNOSIS:  1. CAD 2. History of PAF  POST-OPERATIVE DIAGNOSIS:  1. CAD 2. History of PAF  PROCEDURE: TRANSESOPHAGEAL ECHOCARDIOGRAM (TEE), CORONARY ARTERY BYPASS GRAFTING (CABG)X 3 (LIMA to LAD, SVG to DIAGONAL, SVG to OM) USING LEFT INTERNAL MAMMARY ARTERY AND RIGHT GREATER SAPEHNOUS VEIN, INDOCYANINE GREEN FLUORESCENCE IMAGING (ICG), CLIPPING OF ATRIAL APPENDAGE USING 45 ATRICLIP, and  ENDOVEIN HARVEST OF GREATER SAPHENOUS VEIN (Right)  [facility] HARVEST TIME: 47 minutes;[facility] PREP TIME: 16 minutes  SURGEON:  Surgeon(s) and Role:    Wonda Olds, MD - Primary  PHYSICIAN ASSISTANT: Lars Pinks PA-C  ASSISTANTS: Ara Kussmaul RNFA   ANESTHESIA:   general  EBL:  Per anesthesia record  DRAINS:  Chest tube placed in the mediastinal and pleural spaces    COUNTS CORRECT:  YES  DICTATION: .Dragon Dictation  PLAN OF CARE: Admit to inpatient   PATIENT DISPOSITION:  ICU - intubated and hemodynamically stable.   Delay start of Pharmacological VTE agent (>24hrs) due to surgical blood loss or risk of bleeding: yes  BASELINE WEIGHT: 72.3 kg   Agree with documentation. [name], [employer]

## 2021-05-08 NOTE — Anesthesia Preprocedure Evaluation (Signed)
Anesthesia Evaluation  Patient identified by MRN, date of birth, ID band Patient awake    Reviewed: Allergy & Precautions, NPO status , Patient's Chart, lab work & pertinent test results  Airway Mallampati: II  TM Distance: >3 FB Neck ROM: Full    Dental  (+) Dental Advisory Given   Pulmonary shortness of breath, COPD, Patient abstained from smoking., former smoker,    breath sounds clear to auscultation       Cardiovascular hypertension, Pt. on medications + angina + CAD and + Peripheral Vascular Disease  + dysrhythmias Atrial Fibrillation  Rhythm:Regular Rate:Normal     Neuro/Psych Seizures -,     GI/Hepatic Neg liver ROS, hiatal hernia, GERD  ,  Endo/Other  diabetes, Type 2  Renal/GU Renal disease     Musculoskeletal  (+) Arthritis ,   Abdominal   Peds  Hematology negative hematology ROS (+)   Anesthesia Other Findings   Reproductive/Obstetrics                             Lab Results  Component Value Date   WBC 10.3 05/04/2021   HGB 14.1 05/04/2021   HCT 42.2 05/04/2021   MCV 92.5 05/04/2021   PLT 255 05/04/2021   Lab Results  Component Value Date   CREATININE 0.97 05/04/2021   BUN 20 05/04/2021   NA 135 05/04/2021   K 3.7 05/04/2021   CL 103 05/04/2021   CO2 22 05/04/2021    Anesthesia Physical Anesthesia Plan  ASA: 4  Anesthesia Plan: General   Post-op Pain Management:    Induction: Intravenous  PONV Risk Score and Plan: 2 and Dexamethasone, Ondansetron and Treatment may vary due to age or medical condition  Airway Management Planned: Oral ETT  Additional Equipment: Arterial line, CVP, PA Cath, TEE and Ultrasound Guidance Line Placement  Intra-op Plan:   Post-operative Plan: Post-operative intubation/ventilation  Informed Consent: I have reviewed the patients History and Physical, chart, labs and discussed the procedure including the risks, benefits and  alternatives for the proposed anesthesia with the patient or authorized representative who has indicated his/her understanding and acceptance.       Plan Discussed with:   Anesthesia Plan Comments:         Anesthesia Quick Evaluation

## 2021-05-09 ENCOUNTER — Inpatient Hospital Stay (HOSPITAL_COMMUNITY): Payer: Medicare Other

## 2021-05-09 ENCOUNTER — Encounter (HOSPITAL_COMMUNITY): Payer: Self-pay | Admitting: Cardiothoracic Surgery

## 2021-05-09 DIAGNOSIS — Z951 Presence of aortocoronary bypass graft: Secondary | ICD-10-CM

## 2021-05-09 LAB — CBC
HCT: 23.6 % — ABNORMAL LOW (ref 39.0–52.0)
HCT: 25.2 % — ABNORMAL LOW (ref 39.0–52.0)
Hemoglobin: 7.8 g/dL — ABNORMAL LOW (ref 13.0–17.0)
Hemoglobin: 8.3 g/dL — ABNORMAL LOW (ref 13.0–17.0)
MCH: 31.2 pg (ref 26.0–34.0)
MCH: 31.4 pg (ref 26.0–34.0)
MCHC: 33.1 g/dL (ref 30.0–36.0)
MCHC: 32.9 g/dL (ref 30.0–36.0)
MCV: 94.4 fL (ref 80.0–100.0)
MCV: 95.5 fL (ref 80.0–100.0)
Platelets: 159 10*3/uL (ref 150–400)
Platelets: 154 10*3/uL (ref 150–400)
RBC: 2.5 MIL/uL — ABNORMAL LOW (ref 4.22–5.81)
RBC: 2.64 MIL/uL — ABNORMAL LOW (ref 4.22–5.81)
RDW: 14.1 % (ref 11.5–15.5)
RDW: 14.5 % (ref 11.5–15.5)
WBC: 9.7 10*3/uL (ref 4.0–10.5)
WBC: 12.4 10*3/uL — ABNORMAL HIGH (ref 4.0–10.5)
nRBC: 0 % (ref 0.0–0.2)
nRBC: 0 % (ref 0.0–0.2)

## 2021-05-09 LAB — ECHO INTRAOPERATIVE TEE
AV Mean grad: 2 mmHg
AV Peak grad: 4.4 mmHg
Ao pk vel: 1.05 m/s
Height: 69 in
S' Lateral: 4 cm
Weight: 2550.39 oz

## 2021-05-09 LAB — BASIC METABOLIC PANEL
Anion gap: 5 (ref 5–15)
Anion gap: 7 (ref 5–15)
BUN: 11 mg/dL (ref 8–23)
BUN: 14 mg/dL (ref 8–23)
CO2: 25 mmol/L (ref 22–32)
CO2: 23 mmol/L (ref 22–32)
Calcium: 7.4 mg/dL — ABNORMAL LOW (ref 8.9–10.3)
Calcium: 7.3 mg/dL — ABNORMAL LOW (ref 8.9–10.3)
Chloride: 106 mmol/L (ref 98–111)
Chloride: 103 mmol/L (ref 98–111)
Creatinine, Ser: 0.64 mg/dL (ref 0.61–1.24)
Creatinine, Ser: 0.79 mg/dL (ref 0.61–1.24)
GFR, Estimated: >60 mL/min (ref >60)
GFR, Estimated: >60 mL/min (ref >60)
Glucose, Bld: 100 mg/dL — ABNORMAL HIGH (ref 70–99)
Glucose, Bld: 152 mg/dL — ABNORMAL HIGH (ref 70–99)
Potassium: 4 mmol/L (ref 3.5–5.1)
Potassium: 3.6 mmol/L (ref 3.5–5.1)
Sodium: 136 mmol/L (ref 135–145)
Sodium: 133 mmol/L — ABNORMAL LOW (ref 135–145)

## 2021-05-09 LAB — GLUCOSE, CAPILLARY
Glucose-Capillary: 108 mg/dL — ABNORMAL HIGH (ref 70–99)
Glucose-Capillary: 114 mg/dL — ABNORMAL HIGH (ref 70–99)
Glucose-Capillary: 114 mg/dL — ABNORMAL HIGH (ref 70–99)
Glucose-Capillary: 120 mg/dL — ABNORMAL HIGH (ref 70–99)
Glucose-Capillary: 122 mg/dL — ABNORMAL HIGH (ref 70–99)
Glucose-Capillary: 125 mg/dL — ABNORMAL HIGH (ref 70–99)
Glucose-Capillary: 127 mg/dL — ABNORMAL HIGH (ref 70–99)
Glucose-Capillary: 157 mg/dL — ABNORMAL HIGH (ref 70–99)
Glucose-Capillary: 158 mg/dL — ABNORMAL HIGH (ref 70–99)
Glucose-Capillary: 166 mg/dL — ABNORMAL HIGH (ref 70–99)
Glucose-Capillary: 205 mg/dL — ABNORMAL HIGH (ref 70–99)
Glucose-Capillary: 96 mg/dL (ref 70–99)

## 2021-05-09 LAB — PREPARE RBC (CROSSMATCH)

## 2021-05-09 LAB — MAGNESIUM
Magnesium: 2.1 mg/dL (ref 1.7–2.4)
Magnesium: 2 mg/dL (ref 1.7–2.4)

## 2021-05-09 MED ORDER — LEVALBUTEROL HCL 1.25 MG/0.5ML IN NEBU: 1.2500 mg | INHALATION_SOLUTION | Freq: Two times a day (BID) | RESPIRATORY_TRACT | Status: DC

## 2021-05-09 MED ORDER — SODIUM CHLORIDE 0.9% IV SOLUTION: Freq: Once | INTRAVENOUS | Status: AC

## 2021-05-09 MED ORDER — INSULIN ASPART 100 UNIT/ML IJ SOLN
0.0000 [IU] | INTRAMUSCULAR | Status: DC
  Administered 2021-05-09: 2 [IU] via SUBCUTANEOUS
  Administered 2021-05-09: 8 [IU] via SUBCUTANEOUS
  Administered 2021-05-09: 4 [IU] via SUBCUTANEOUS
  Administered 2021-05-10 – 2021-05-12 (×9): 2 [IU] via SUBCUTANEOUS

## 2021-05-09 MED ORDER — THIAMINE HCL 100 MG/ML IJ SOLN
Freq: Once | INTRAVENOUS | Status: AC
  Filled 2021-05-09: qty 1000

## 2021-05-09 MED ORDER — ORAL CARE MOUTH RINSE
15.0000 mL | Freq: Two times a day (BID) | OROMUCOSAL | Status: DC
  Administered 2021-05-09 – 2021-05-21 (×18): 15 mL via OROMUCOSAL

## 2021-05-09 MED ORDER — LEVALBUTEROL HCL 1.25 MG/0.5ML IN NEBU
1.2500 mg | INHALATION_SOLUTION | Freq: Two times a day (BID) | RESPIRATORY_TRACT | Status: DC
  Administered 2021-05-09: 1.25 mg via RESPIRATORY_TRACT
  Filled 2021-05-09: qty 0.5

## 2021-05-09 MED ORDER — POTASSIUM CHLORIDE CRYS ER 20 MEQ PO TBCR
20.0000 meq | EXTENDED_RELEASE_TABLET | ORAL | Status: AC
  Administered 2021-05-09 – 2021-05-10 (×3): 20 meq via ORAL
  Filled 2021-05-09 (×3): qty 1

## 2021-05-09 MED ORDER — KETOROLAC TROMETHAMINE 15 MG/ML IJ SOLN
7.5000 mg | Freq: Four times a day (QID) | INTRAMUSCULAR | Status: AC
  Administered 2021-05-09 – 2021-05-10 (×5): 7.5 mg via INTRAVENOUS
  Filled 2021-05-09 (×4): qty 1

## 2021-05-09 MED ORDER — AMIODARONE HCL IN DEXTROSE 360-4.14 MG/200ML-% IV SOLN
60.0000 mg/h | INTRAVENOUS | Status: AC
  Administered 2021-05-09: 60 mg/h via INTRAVENOUS
  Filled 2021-05-09: qty 200

## 2021-05-09 MED ORDER — AMIODARONE HCL IN DEXTROSE 360-4.14 MG/200ML-% IV SOLN
30.0000 mg/h | INTRAVENOUS | Status: DC
  Administered 2021-05-09 – 2021-05-10 (×3): 30 mg/h via INTRAVENOUS
  Filled 2021-05-09 (×3): qty 200

## 2021-05-09 NOTE — Progress Notes (Signed)
1 Day Post-Op Procedure(s) (LRB): CORONARY ARTERY BYPASS GRAFTING (CABG)X 3 USING LEFT INTERNAL MAMMARY ARTERY AND RIGHT GREATER SAPEHNOUS VEIN (N/A) INDOCYANINE GREEN FLUORESCENCE IMAGING (ICG) (N/A) TRANSESOPHAGEAL ECHOCARDIOGRAM (TEE) (N/A) CLIPPING OF ATRIAL APPENDAGE USING 45 ATRICLIP (Left) APPLICATION OF CELL SAVER (N/A) ENDOVEIN HARVEST OF GREATER SAPHENOUS VEIN (Right) Subjective: Mild incisional pain  Objective: Vital signs in last 24 hours: Temp:  [95.54 F (35.3 C)-99.5 F (37.5 C)] 99.32 F (37.4 C) (07/06 0700) Pulse Rate:  [63-96] 92 (07/06 0700) Cardiac Rhythm: Atrial paced (07/06 0400) Resp:  [2-43] 24 (07/06 0700) BP: (89-118)/(52-83) 92/66 (07/06 0700) SpO2:  [93 %-100 %] 98 % (07/06 0700) Arterial Line BP: (20-147)/(9-76) 99/50 (07/06 0700) FiO2 (%):  [40 %-50 %] 40 % (07/05 1607) Weight:  [74.6 kg] 74.6 kg (07/06 0600)  Hemodynamic parameters for last 24 hours: PAP: (17-114)/(7-66) 30/14 CO:  [3.6 L/min-4.6 L/min] 4.5 L/min CI:  [1.9 L/min/m2-2.4 L/min/m2] 2.4 L/min/m2  Intake/Output from previous day: 07/05 0701 - 07/06 0700 In: 7784.7 [I.V.:4888.5; Blood:320; IV Piggyback:2576.2] Out: 3042 [WERXV:4008; Blood:457; Chest Tube:1110] Intake/Output this shift: No intake/output data recorded.  General appearance: alert and cooperative Neurologic: intact Heart: regular rate and rhythm, S1, S2 normal, no murmur, click, rub or gallop Lungs: clear to auscultation bilaterally Extremities: extremities normal, atraumatic, no cyanosis or edema  Lab Results: Recent Labs    05/08/21 1810 05/08/21 1817 05/08/21 2117 05/09/21 0420  WBC 9.7  --   --  9.7  HGB 8.5*   < > 8.8* 7.8*  HCT 26.2*   < > 26.0* 23.6*  PLT 160  --   --  159   < > = values in this interval not displayed.   BMET:  Recent Labs    05/08/21 1810 05/08/21 1817 05/08/21 2117 05/09/21 0420  NA 135   < > 138 136  K 4.2   < > 4.3 4.0  CL 105  --   --  106  CO2 22  --   --  25   GLUCOSE 147*  --   --  100*  BUN 12  --   --  11  CREATININE 0.79  --   --  0.64  CALCIUM 7.2*  --   --  7.4*   < > = values in this interval not displayed.    PT/INR:  Recent Labs    05/08/21 1231  LABPROT 17.5*  INR 1.4*   ABG    Component Value Date/Time   PHART 7.378 05/08/2021 2117   HCO3 23.2 05/08/2021 2117   TCO2 24 05/08/2021 2117   ACIDBASEDEF 2.0 05/08/2021 2117   O2SAT 96.0 05/08/2021 2117   CBG (last 3)  Recent Labs    05/09/21 0418 05/09/21 0529 05/09/21 0614  GLUCAP 108* 127* 157*    Assessment/Plan: S/P Procedure(s) (LRB): CORONARY ARTERY BYPASS GRAFTING (CABG)X 3 USING LEFT INTERNAL MAMMARY ARTERY AND RIGHT GREATER SAPEHNOUS VEIN (N/A) INDOCYANINE GREEN FLUORESCENCE IMAGING (ICG) (N/A) TRANSESOPHAGEAL ECHOCARDIOGRAM (TEE) (N/A) CLIPPING OF ATRIAL APPENDAGE USING 45 ATRICLIP (Left) APPLICATION OF CELL SAVER (N/A) ENDOVEIN HARVEST OF GREATER SAPHENOUS VEIN (Right) Mobilize D/c pa cath Gentle resuscitation   LOS: 1 day    Robert Bates 05/09/2021

## 2021-05-09 NOTE — Progress Notes (Signed)
EKG CRITICAL VALUE     12 lead EKG performed.  Critical value noted.  Cyd Silence, RN notified.   Leronda Lewers H, CCT 05/09/2021 7:09 AM

## 2021-05-09 NOTE — Discharge Summary (Signed)
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  +-------------+-------++ AORTA                +-------------+-------++ Ao Root diam:3.40 cm +-------------+-------++  +--------------+-------+ SHUNTS                +--------------+-------+ Systemic Diam:1.80 cm +--------------+-------+  Suzette Battiest MD Electronically signed by Suzette Battiest MD Signature Date/Time: 05/09/2021/7:15:26 AM    Final    VAS US DOPPLER PRE CABG  Result Date: 04/26/2021 PREOPERATIVE VASCULAR EVALUATION Patient Name:  JIM LUNDIN  Date of Exam:   04/25/2021 Medical Rec #: [id number]      Accession #:    [id number] Date of Birth: 1941/04/08      Patient Gender: M Patient Age:   79Y Exam Location:  [facility] Procedure:      VAS US DOPPLER PRE CABG Referring Phys: [id number] [place] --------------------------------------------------------------------------------  Indications:      Pre-CABG. Risk Factors:     Hypertension, hyperlipidemia, Diabetes, past history of                   smoking. Other Factors:    Covid-19+. Comparison Study: No prior studies. Performing Technologist: Darlin Coco RDMS,RVT  Examination Guidelines: A complete evaluation includes B-mode imaging, spectral  Doppler, color Doppler, and power Doppler as needed of all accessible portions of each vessel. Bilateral testing is considered an integral part of a complete examination. Limited examinations for reoccurring indications may be performed as noted.  Right Carotid Findings: +----------+--------+--------+--------+------------+--------+           PSV cm/sEDV cm/sStenosisDescribe    Comments +----------+--------+--------+--------+------------+--------+ CCA Prox  78      16                                   +----------+--------+--------+--------+------------+--------+ CCA Distal63      17                                   +----------+--------+--------+--------+------------+--------+ ICA Prox  60      12      1-39%   heterogenous         +----------+--------+--------+--------+------------+--------+ ICA Distal39      14                                   +----------+--------+--------+--------+------------+--------+ ECA       110     14                                   +----------+--------+--------+--------+------------+--------+  Portions of this table do not appear on this page. +----------+--------+-------+----------------+------------+           PSV cm/sEDV cmsDescribe        Arm Pressure +----------+--------+-------+----------------+------------+ [id number]             Multiphasic, WNL             +----------+--------+-------+----------------+------------+ +---------+--------+--+--------+-+---------+ VertebralPSV cm/s41EDV cm/s8Antegrade +---------+--------+--+--------+-+---------+ Left Carotid Findings: +----------+--------+--------+--------+------------+--------+           PSV cm/sEDV cm/sStenosisDescribe    Comments +----------+--------+--------+--------+------------+--------+ CCA Prox  76      14                                   +----------+--------+--------+--------+------------+--------+ CCA Distal73      15                                    +----------+--------+--------+--------+------------+--------+ ICA Prox  56      18      1-39%   heterogenous         +----------+--------+--------+--------+------------+--------+ ICA Distal35      12                                   +----------+--------+--------+--------+------------+--------+ ECA       80      11                                   +----------+--------+--------+--------+------------+--------+ +----------+--------+--------+----------------+------------+ SubclavianPSV cm/sEDV cm/sDescribe        Arm Pressure +----------+--------+--------+----------------+------------+           94              Multiphasic, WNL             +----------+--------+--------+----------------+------------+ +---------+--------+--+--------+--+---------+ VertebralPSV cm/s42EDV cm/s14Antegrade +---------+--------+--+--------+--+---------+  ABI Findings: +--------+------------------+-----+---------+--------+ Right   Rt Pressure (mmHg)IndexWaveform Comment  +--------+------------------+-----+---------+--------+ Brachial                       triphasic         +--------+------------------+-----+---------+--------+ PTA                            triphasic         +--------+------------------+-----+---------+--------+ DP                             triphasic         +--------+------------------+-----+---------+--------+ +--------+------------------+-----+---------+-------+ Left    Lt Pressure (mmHg)IndexWaveform Comment +--------+------------------+-----+---------+-------+ Brachial                       triphasic        +--------+------------------+-----+---------+-------+ PTA                            triphasic        +--------+------------------+-----+---------+-------+ DP                             triphasic        +--------+------------------+-----+---------+-------+  Right Doppler Findings:  +--------+--------+-----+---------+--------+  Site    PressureIndexDoppler  Comments +--------+--------+-----+---------+--------+ Brachial             triphasic         +--------+--------+-----+---------+--------+ Radial               triphasic         +--------+--------+-----+---------+--------+ Ulnar                triphasic         +--------+--------+-----+---------+--------+  Left Doppler Findings: +--------+--------+-----+---------+--------+ Site    PressureIndexDoppler  Comments +--------+--------+-----+---------+--------+ Brachial             triphasic         +--------+--------+-----+---------+--------+ Radial               triphasic         +--------+--------+-----+---------+--------+ Ulnar                triphasic         +--------+--------+-----+---------+--------+  Summary: Right Carotid: Velocities in the right ICA are consistent with a 1-39% stenosis. Left Carotid: Velocities in the left ICA are consistent with a 1-39% stenosis. Vertebrals:  Bilateral vertebral arteries demonstrate antegrade flow. Subclavians: Normal flow hemodynamics were seen in bilateral subclavian              arteries. Right Upper Extremity: Doppler waveforms remain within normal limits with right radial compression. Doppler waveforms remain within normal limits with right ulnar compression. Left Upper Extremity: Doppler waveform obliterate with left radial compression. Doppler waveforms remain within normal limits with left ulnar compression.  Electronically signed by Jamelle Haring on 04/26/2021 at 12:27:52 PM.    Final    IR THORACENTESIS ASP PLEURAL SPACE W/IMG GUIDE  Result Date: 05/18/2021 INDICATION: Shortness of breath. Status post recent CABG. Left-sided pleural effusion. Request for therapeutic thoracentesis. EXAM: ULTRASOUND GUIDED LEFT THORACENTESIS MEDICATIONS: 1% plain lidocaine, 5 mL COMPLICATIONS: None immediate. PROCEDURE: An ultrasound guided thoracentesis was  thoroughly discussed with the patient and questions answered. The benefits, risks, alternatives and complications were also discussed. The patient understands and wishes to proceed with the procedure. Written consent was obtained. Ultrasound was performed to localize and mark an adequate pocket of fluid in the left chest. The area was then prepped and draped in the normal sterile fashion. 1% Lidocaine was used for local anesthesia. Under ultrasound guidance a 6 Fr Safe-T-Centesis catheter was introduced. Thoracentesis was performed. The catheter was removed and a dressing applied. FINDINGS: A total of approximately 1.1 L of blood-tinged fluid was removed. IMPRESSION: Successful ultrasound guided left thoracentesis yielding 1.1 L of pleural fluid. Read by: Ascencion Dike PA-C Electronically Signed   By: Corrie Mckusick D.O.   On: 05/18/2021 11:58    Discharge Instructions     Amb Referral to Cardiac Rehabilitation   Complete by: As directed    Will send Cardiac Rehab Phase 2 referral to [place]   Diagnosis: CABG   CABG X ___: 3   After initial evaluation and assessments completed: Virtual Based Care may be provided alone or in conjunction with Phase 2 Cardiac Rehab based on patient barriers.: Yes       Discharge Medications: Allergies as of 05/21/2021       Reactions   Iodine Other (See Comments)   neck swells   Iohexol Swelling   Neck and gland swelling per patient.        Medication List     STOP taking these medications    Advair HFA 115-21  MCG/ACT inhaler Generic drug: fluticasone-salmeterol   nitroGLYCERIN 0.4 MG SL tablet Commonly known as: NITROSTAT       TAKE these medications    acetaminophen 500 MG tablet Commonly known as: TYLENOL Take 1-2 tablets (500-1,000 mg total) by mouth every 6 (six) hours as needed.   albuterol 108 (90 Base) MCG/ACT inhaler Commonly known as: VENTOLIN HFA Inhale 2 puffs into the lungs every 6 (six) hours as needed for wheezing or  shortness of breath.   amiodarone 200 MG tablet Commonly known as: PACERONE Take 1 tablet (200 mg total) by mouth 2 (two) times daily for 5 days, then take tablet (200 mg) once daily thereafter What changed:  how much to take how to take this when to take this additional instructions   aspirin EC 81 MG tablet Take 1 tablet (81 mg total) by mouth in the morning. Swallow whole.   atorvastatin 80 MG tablet Commonly known as: LIPITOR Take 1 tablet (80 mg total) by mouth daily. What changed: when to take this   cephALEXin 500 MG capsule Commonly known as: KEFLEX Take 1 capsule (500 mg total) by mouth every 8 (eight) hours.   Dulera 200-5 MCG/ACT Aero Generic drug: mometasone-formoterol Inhale 2 puffs into the lungs 2 (two) times daily.   metoprolol tartrate 25 MG tablet Commonly known as: LOPRESSOR Take 1 tablet (25 mg total) by mouth 2 (two) times daily.   multivitamin with minerals Tabs tablet Take 1 tablet by mouth daily in the afternoon.   omeprazole 40 MG capsule Commonly known as: PRILOSEC Take 1 capsule (40 mg total) by mouth daily.   potassium chloride 10 MEQ tablet Commonly known as: KLOR-CON Take 1 tablet (10 mEq total) by mouth daily for 4 days then stop.   tamsulosin 0.4 MG Caps capsule Commonly known as: FLOMAX TAKE 1 CAPSULE (0.4 MG TOTAL) BY MOUTH DAILY. What changed:  how much to take when to take this   torsemide 20 MG tablet Commonly known as: DEMADEX Take 1 tablet (20 mg total) by mouth daily for 4 days then stop.   traMADol 50 MG tablet Commonly known as: ULTRAM Take 1 tablet (50 mg total) by mouth every 6 (six) hours as needed for moderate pain.   Xarelto 20 MG Tabs tablet Generic drug: rivaroxaban TAKE 1 TABLET BY MOUTH DAILY WITH SUPPER What changed:  how much to take how to take this when to take this       The patient has been discharged on:   1.Beta Blocker:  Yes [  x ]                              No   [   ]                               If No, reason:  2.Ace Inhibitor/ARB: Yes [   ]                                     No  [   x ]                                     If No, reason:Labile BP  3.Statin:  Yes [ x  ]                  No  [   ]                  If No, reason:  4.Ecasa:  Yes  [  x ]                  No   [   ]                  If No, reason:  Patient had ACS upon admission:No  Plavix/P2Y12 inhibitor: Yes [   ]                                      No  [ x  ]   Follow Up Appointments:  Follow-up Information     Wonda Olds, MD Follow up on 05/24/2021.   Specialty: Cardiothoracic Surgery Why: Appointment time is at 12:00 pm Contact information: [address] [place] [address] [phone]         Richardson Dopp T, PA-C Follow up on 06/05/2021.   Specialties: Cardiology, Physician Assistant Why: Please arrive 15 minutes early for your 8:45am post-hospital cardiology appointment. Contact information: [address] [place] [address] [phone]         Oliver Barre, [place]. Go on 05/28/2021.   Specialty: Cardiology Why: Appointment time is at 1:30 pm Contact information: [place] [address] [phone]         [place]a A[place], [facility] [place] Follow up.   Specialty: Home Health Services Why: HHPT/OT arranged- they will contact you within 48 hr post discharge to schedule initial home visit Contact information: [place] [address] [phone]                 Signed: Sharalyn Ink [facility] 05/21/2021, 9:49 AM

## 2021-05-09 NOTE — Progress Notes (Signed)
Notified Atkins MD on rounds of increased pressor needs throughout the day, 130 mL CT output with walking, and rhythm changes (atrial bigeminy). Orders received for 1 unit pRBCs and to start an amiodarone gtt.

## 2021-05-10 ENCOUNTER — Inpatient Hospital Stay (HOSPITAL_COMMUNITY): Payer: Medicare Other

## 2021-05-10 LAB — BASIC METABOLIC PANEL
Anion gap: 3 — ABNORMAL LOW (ref 5–15)
BUN: 16 mg/dL (ref 8–23)
CO2: 25 mmol/L (ref 22–32)
Calcium: 7.4 mg/dL — ABNORMAL LOW (ref 8.9–10.3)
Chloride: 105 mmol/L (ref 98–111)
Creatinine, Ser: 0.7 mg/dL (ref 0.61–1.24)
GFR, Estimated: >60 mL/min (ref >60)
Glucose, Bld: 108 mg/dL — ABNORMAL HIGH (ref 70–99)
Potassium: 4.5 mmol/L (ref 3.5–5.1)
Sodium: 133 mmol/L — ABNORMAL LOW (ref 135–145)

## 2021-05-10 LAB — GLUCOSE, CAPILLARY
Glucose-Capillary: 104 mg/dL — ABNORMAL HIGH (ref 70–99)
Glucose-Capillary: 109 mg/dL — ABNORMAL HIGH (ref 70–99)
Glucose-Capillary: 123 mg/dL — ABNORMAL HIGH (ref 70–99)
Glucose-Capillary: 148 mg/dL — ABNORMAL HIGH (ref 70–99)
Glucose-Capillary: 144 mg/dL — ABNORMAL HIGH (ref 70–99)

## 2021-05-10 LAB — CBC
HCT: 26.1 % — ABNORMAL LOW (ref 39.0–52.0)
Hemoglobin: 8.4 g/dL — ABNORMAL LOW (ref 13.0–17.0)
MCH: 30.7 pg (ref 26.0–34.0)
MCHC: 32.2 g/dL (ref 30.0–36.0)
MCV: 95.3 fL (ref 80.0–100.0)
Platelets: 152 10*3/uL (ref 150–400)
RBC: 2.74 MIL/uL — ABNORMAL LOW (ref 4.22–5.81)
RDW: 14.6 % (ref 11.5–15.5)
WBC: 11.6 10*3/uL — ABNORMAL HIGH (ref 4.0–10.5)
nRBC: 0 % (ref 0.0–0.2)

## 2021-05-10 LAB — PREPARE RBC (CROSSMATCH)

## 2021-05-10 MED ORDER — POTASSIUM CHLORIDE CRYS ER 20 MEQ PO TBCR
20.0000 meq | EXTENDED_RELEASE_TABLET | Freq: Two times a day (BID) | ORAL | Status: DC
  Administered 2021-05-10 – 2021-05-12 (×5): 20 meq via ORAL
  Filled 2021-05-10 (×5): qty 1

## 2021-05-10 MED ORDER — ALBUMIN HUMAN 5 % IV SOLN
12.5000 g | Freq: Once | INTRAVENOUS | Status: AC
  Administered 2021-05-11: 12.5 g via INTRAVENOUS
  Filled 2021-05-10: qty 250

## 2021-05-10 MED ORDER — POLYETHYLENE GLYCOL 3350 17 G PO PACK
17.0000 g | PACK | Freq: Every day | ORAL | Status: DC
  Administered 2021-05-10 – 2021-05-11 (×2): 17 g via ORAL
  Filled 2021-05-10 (×3): qty 1

## 2021-05-10 MED ORDER — SODIUM CHLORIDE 0.9% IV SOLUTION: Freq: Once | INTRAVENOUS | Status: DC

## 2021-05-10 MED ORDER — MIDODRINE HCL 5 MG PO TABS
10.0000 mg | ORAL_TABLET | Freq: Three times a day (TID) | ORAL | Status: DC
  Administered 2021-05-10 – 2021-05-15 (×13): 10 mg via ORAL
  Filled 2021-05-10 (×14): qty 2

## 2021-05-10 MED ORDER — COLCHICINE 0.3 MG HALF TABLET
0.3000 mg | ORAL_TABLET | Freq: Two times a day (BID) | ORAL | Status: DC
  Administered 2021-05-10 (×2): 0.3 mg via ORAL
  Filled 2021-05-10 (×3): qty 1

## 2021-05-10 MED ORDER — FUROSEMIDE 10 MG/ML IJ SOLN
40.0000 mg | Freq: Two times a day (BID) | INTRAMUSCULAR | Status: DC
  Administered 2021-05-10 – 2021-05-12 (×6): 40 mg via INTRAVENOUS
  Filled 2021-05-10 (×6): qty 4

## 2021-05-10 NOTE — Progress Notes (Signed)
Notified Bartle MD of increased pressor needs, received orders for 1 albumin and 1 unit pRBCs. Will continue to monitor throughout shift.

## 2021-05-10 NOTE — Progress Notes (Signed)
Patient ID: Robert Bates, male   DOB: 26-Aug-1941, 80 y.o.   MRN: [id number] TCTS evening rounds:  Remains on neo 15-20 mcg with MAP about 60.   Rhythm looks sinus with PAC's on IV amio. Rate 70. Will hold lopressor for now with marginal BP on neo.  Start some midodrine to support BP and allow neo wean.  Diuresed some today. Will hold lasix tonight with borderline BP.

## 2021-05-10 NOTE — Progress Notes (Signed)
2 Days Post-Op Procedure(s) (LRB): CORONARY ARTERY BYPASS GRAFTING (CABG)X 3 USING LEFT INTERNAL MAMMARY ARTERY AND RIGHT GREATER SAPEHNOUS VEIN (N/A) INDOCYANINE GREEN FLUORESCENCE IMAGING (ICG) (N/A) TRANSESOPHAGEAL ECHOCARDIOGRAM (TEE) (N/A) CLIPPING OF ATRIAL APPENDAGE USING 45 ATRICLIP (Left) APPLICATION OF CELL SAVER (N/A) ENDOVEIN HARVEST OF GREATER SAPHENOUS VEIN (Right) Subjective: No complaints  Objective: Vital signs in last 24 hours: Temp:  [97.4 F (36.3 C)-98.5 F (36.9 C)] 97.6 F (36.4 C) (07/07 0742) Pulse Rate:  [85-127] 92 (07/07 0800) Cardiac Rhythm: Normal sinus rhythm;Atrial paced (07/07 0400) Resp:  [11-33] 21 (07/07 0800) BP: (84-115)/(53-79) 113/79 (07/07 0800) SpO2:  [87 %-99 %] 94 % (07/07 0806) Arterial Line BP: (84-264)/(42-260) 126/67 (07/07 0800) Weight:  [81 kg] 81 kg (07/07 0500)  Hemodynamic parameters for last 24 hours:    Intake/Output from previous day: 07/06 0701 - 07/07 0700 In: 3211.8 [I.V.:2595.9; Blood:315; IV Piggyback:300.9] Out: 1415 [Urine:775; Chest Tube:640] Intake/Output this shift: No intake/output data recorded.  General appearance: alert and cooperative Neurologic: intact Heart: regular rate and rhythm, S1, S2 normal, no murmur, click, rub or gallop Lungs: clear to auscultation bilaterally Abdomen: soft, non-tender; bowel sounds normal; no masses,  no organomegaly Extremities: edema 2+ Wound: dressed, dry  Lab Results: Recent Labs    05/09/21 1520 05/10/21 0430  WBC 12.4* 11.6*  HGB 8.3* 8.4*  HCT 25.2* 26.1*  PLT 154 152   BMET:  Recent Labs    05/09/21 1520 05/10/21 0430  NA 133* 133*  K 3.6 4.5  CL 103 105  CO2 23 25  GLUCOSE 152* 108*  BUN 14 16  CREATININE 0.79 0.70  CALCIUM 7.3* 7.4*    PT/INR:  Recent Labs    05/08/21 1231  LABPROT 17.5*  INR 1.4*   ABG    Component Value Date/Time   PHART 7.378 05/08/2021 2117   HCO3 23.2 05/08/2021 2117   TCO2 24 05/08/2021 2117   ACIDBASEDEF  2.0 05/08/2021 2117   O2SAT 96.0 05/08/2021 2117   CBG (last 3)  Recent Labs    05/09/21 2343 05/10/21 0425 05/10/21 0744  GLUCAP 96 104* 109*    Assessment/Plan: S/P Procedure(s) (LRB): CORONARY ARTERY BYPASS GRAFTING (CABG)X 3 USING LEFT INTERNAL MAMMARY ARTERY AND RIGHT GREATER SAPEHNOUS VEIN (N/A) INDOCYANINE GREEN FLUORESCENCE IMAGING (ICG) (N/A) TRANSESOPHAGEAL ECHOCARDIOGRAM (TEE) (N/A) CLIPPING OF ATRIAL APPENDAGE USING 45 ATRICLIP (Left) APPLICATION OF CELL SAVER (N/A) ENDOVEIN HARVEST OF GREATER SAPHENOUS VEIN (Right) Mobilize Diuresis Remove chest tubes Cont amiodarone    LOS: 2 days    Wonda Olds 05/10/2021

## 2021-05-11 ENCOUNTER — Inpatient Hospital Stay (HOSPITAL_COMMUNITY): Payer: Medicare Other

## 2021-05-11 LAB — BASIC METABOLIC PANEL
Anion gap: 3 — ABNORMAL LOW (ref 5–15)
BUN: 22 mg/dL (ref 8–23)
CO2: 26 mmol/L (ref 22–32)
Calcium: 7.6 mg/dL — ABNORMAL LOW (ref 8.9–10.3)
Chloride: 104 mmol/L (ref 98–111)
Creatinine, Ser: 0.84 mg/dL (ref 0.61–1.24)
GFR, Estimated: >60 mL/min (ref >60)
Glucose, Bld: 116 mg/dL — ABNORMAL HIGH (ref 70–99)
Potassium: 4.6 mmol/L (ref 3.5–5.1)
Sodium: 133 mmol/L — ABNORMAL LOW (ref 135–145)

## 2021-05-11 LAB — CBC
HCT: 27.1 % — ABNORMAL LOW (ref 39.0–52.0)
Hemoglobin: 8.9 g/dL — ABNORMAL LOW (ref 13.0–17.0)
MCH: 30.8 pg (ref 26.0–34.0)
MCHC: 32.8 g/dL (ref 30.0–36.0)
MCV: 93.8 fL (ref 80.0–100.0)
Platelets: 134 10*3/uL — ABNORMAL LOW (ref 150–400)
RBC: 2.89 MIL/uL — ABNORMAL LOW (ref 4.22–5.81)
RDW: 14.6 % (ref 11.5–15.5)
WBC: 11.8 10*3/uL — ABNORMAL HIGH (ref 4.0–10.5)
nRBC: 0 % (ref 0.0–0.2)

## 2021-05-11 LAB — GLUCOSE, CAPILLARY
Glucose-Capillary: 109 mg/dL — ABNORMAL HIGH (ref 70–99)
Glucose-Capillary: 115 mg/dL — ABNORMAL HIGH (ref 70–99)
Glucose-Capillary: 125 mg/dL — ABNORMAL HIGH (ref 70–99)
Glucose-Capillary: 125 mg/dL — ABNORMAL HIGH (ref 70–99)
Glucose-Capillary: 136 mg/dL — ABNORMAL HIGH (ref 70–99)
Glucose-Capillary: 136 mg/dL — ABNORMAL HIGH (ref 70–99)
Glucose-Capillary: 98 mg/dL (ref 70–99)

## 2021-05-11 MED ORDER — AMIODARONE HCL 200 MG PO TABS
400.0000 mg | ORAL_TABLET | Freq: Two times a day (BID) | ORAL | Status: AC
  Administered 2021-05-11 – 2021-05-18 (×16): 400 mg via ORAL
  Filled 2021-05-11 (×17): qty 2

## 2021-05-11 MED ORDER — COLCHICINE 0.6 MG PO TABS
0.6000 mg | ORAL_TABLET | Freq: Two times a day (BID) | ORAL | Status: DC
  Administered 2021-05-11 – 2021-05-14 (×7): 0.6 mg via ORAL
  Filled 2021-05-11 (×8): qty 1

## 2021-05-11 MED FILL — Sodium Chloride IV Soln 0.9%: INTRAVENOUS | Qty: 2000 | Status: AC

## 2021-05-11 MED FILL — Sodium Bicarbonate IV Soln 8.4%: INTRAVENOUS | Qty: 50 | Status: AC

## 2021-05-11 MED FILL — Potassium Chloride Inj 2 mEq/ML: INTRAVENOUS | Qty: 40 | Status: AC

## 2021-05-11 MED FILL — Heparin Sodium (Porcine) Inj 1000 Unit/ML: INTRAMUSCULAR | Qty: 10 | Status: AC

## 2021-05-11 MED FILL — Mannitol IV Soln 20%: INTRAVENOUS | Qty: 500 | Status: AC

## 2021-05-11 MED FILL — Electrolyte-R (PH 7.4) Solution: INTRAVENOUS | Qty: 4000 | Status: AC

## 2021-05-11 MED FILL — Lidocaine HCl Local Preservative Free (PF) Inj 2%: INTRAMUSCULAR | Qty: 5 | Status: AC

## 2021-05-11 MED FILL — Heparin Sodium (Porcine) Inj 1000 Unit/ML: INTRAMUSCULAR | Qty: 30 | Status: AC

## 2021-05-11 MED FILL — Magnesium Sulfate Inj 50%: INTRAMUSCULAR | Qty: 10 | Status: AC

## 2021-05-11 NOTE — Progress Notes (Signed)
TCTS BRIEF SICU PROGRESS NOTE  3 Days Post-Op  S/P Procedure(s) (LRB): CORONARY ARTERY BYPASS GRAFTING (CABG)X 3 USING LEFT INTERNAL MAMMARY ARTERY AND RIGHT GREATER SAPEHNOUS VEIN (N/A) INDOCYANINE GREEN FLUORESCENCE IMAGING (ICG) (N/A) TRANSESOPHAGEAL ECHOCARDIOGRAM (TEE) (N/A) CLIPPING OF ATRIAL APPENDAGE USING 45 ATRICLIP (Left) APPLICATION OF CELL SAVER (N/A) ENDOVEIN HARVEST OF GREATER SAPHENOUS VEIN (Right)   Stable day  Plan: Awaiting bed for transfer  Rexene Alberts, MD 05/11/2021 5:19 PM

## 2021-05-11 NOTE — Plan of Care (Signed)

## 2021-05-11 NOTE — Progress Notes (Addendum)
CARDIAC REHAB PHASE I   PRE:  Rate/Rhythm: 120-137 Afib  BP:  Sitting: 105/61      SaO2: 96 2L  MODE:  Ambulation: 370 ft   POST:  Rate/Rhythm: 133 Afib  BP:  Sitting: 105/71      SaO2: 95 2L  Pt tolerated exercise fair and amb 370 ft with EVA, assist x2, and gait belt used on 2L. Stopped x2 to rest. Encouraged pursed lip breathing. To recliner w/ call bell in reach and family in room. Encouraged staying in the tube. [place], [place], ACM-CEP 05/11/2021 2:35 PM

## 2021-05-11 NOTE — Progress Notes (Signed)
3 Days Post-Op Procedure(s) (LRB): CORONARY ARTERY BYPASS GRAFTING (CABG)X 3 USING LEFT INTERNAL MAMMARY ARTERY AND RIGHT GREATER SAPEHNOUS VEIN (N/A) INDOCYANINE GREEN FLUORESCENCE IMAGING (ICG) (N/A) TRANSESOPHAGEAL ECHOCARDIOGRAM (TEE) (N/A) CLIPPING OF ATRIAL APPENDAGE USING 45 ATRICLIP (Left) APPLICATION OF CELL SAVER (N/A) ENDOVEIN HARVEST OF GREATER SAPHENOUS VEIN (Right) Subjective: Not hungry  Objective: Vital signs in last 24 hours: Temp:  [96.8 F (36 C)-98.9 F (37.2 C)] 97.5 F (36.4 C) (07/08 0728) Pulse Rate:  [61-129] 74 (07/08 0645) Cardiac Rhythm: Normal sinus rhythm (07/08 0400) Resp:  [10-31] 27 (07/08 0645) BP: (75-121)/(51-85) 120/63 (07/08 0635) SpO2:  [86 %-100 %] 99 % (07/08 0645) Arterial Line BP: (68-138)/(44-76) 118/55 (07/08 0645) Weight:  [81.5 kg] 81.5 kg (07/08 0500)  Hemodynamic parameters for last 24 hours:    Intake/Output from previous day: 07/07 0701 - 07/08 0700 In: 2356.3 [P.O.:220; I.V.:1379.6; Blood:584; IV Piggyback:172.7] Out: 4709 [GGEZM:6294; Chest Tube:370] Intake/Output this shift: No intake/output data recorded.  General appearance: alert and cooperative Neurologic: intact Heart: regular rate and rhythm, S1, S2 normal, no murmur, click, rub or gallop Lungs: clear to auscultation bilaterally Abdomen: soft, non-tender; bowel sounds normal; no masses,  no organomegaly Extremities: edema mild Wound: c/d/i  Lab Results: Recent Labs    05/10/21 0430 05/11/21 0403  WBC 11.6* 11.8*  HGB 8.4* 8.9*  HCT 26.1* 27.1*  PLT 152 134*   BMET:  Recent Labs    05/10/21 0430 05/11/21 0403  NA 133* 133*  K 4.5 4.6  CL 105 104  CO2 25 26  GLUCOSE 108* 116*  BUN 16 22  CREATININE 0.70 0.84  CALCIUM 7.4* 7.6*    PT/INR:  Recent Labs    05/08/21 1231  LABPROT 17.5*  INR 1.4*   ABG    Component Value Date/Time   PHART 7.378 05/08/2021 2117   HCO3 23.2 05/08/2021 2117   TCO2 24 05/08/2021 2117   ACIDBASEDEF 2.0  05/08/2021 2117   O2SAT 96.0 05/08/2021 2117   CBG (last 3)  Recent Labs    05/11/21 0005 05/11/21 0343 05/11/21 0725  GLUCAP 125* 109* 136*    Assessment/Plan: S/P Procedure(s) (LRB): CORONARY ARTERY BYPASS GRAFTING (CABG)X 3 USING LEFT INTERNAL MAMMARY ARTERY AND RIGHT GREATER SAPEHNOUS VEIN (N/A) INDOCYANINE GREEN FLUORESCENCE IMAGING (ICG) (N/A) TRANSESOPHAGEAL ECHOCARDIOGRAM (TEE) (N/A) CLIPPING OF ATRIAL APPENDAGE USING 45 ATRICLIP (Left) APPLICATION OF CELL SAVER (N/A) ENDOVEIN HARVEST OF GREATER SAPHENOUS VEIN (Right) Mobilize Diuresis Plan for transfer to step-down: see transfer orders Restart beta blocker   LOS: 3 days    Robert Bates 05/11/2021

## 2021-05-12 ENCOUNTER — Inpatient Hospital Stay (HOSPITAL_COMMUNITY): Payer: Medicare Other

## 2021-05-12 LAB — BPAM RBC
Blood Product Expiration Date: 202207302359
Blood Product Expiration Date: 202208052359
ISSUE DATE / TIME: 202207061704
ISSUE DATE / TIME: 202207080049
Unit Type and Rh: 5100
Unit Type and Rh: 5100

## 2021-05-12 LAB — GLUCOSE, CAPILLARY
Glucose-Capillary: 100 mg/dL — ABNORMAL HIGH (ref 70–99)
Glucose-Capillary: 105 mg/dL — ABNORMAL HIGH (ref 70–99)
Glucose-Capillary: 121 mg/dL — ABNORMAL HIGH (ref 70–99)
Glucose-Capillary: 129 mg/dL — ABNORMAL HIGH (ref 70–99)
Glucose-Capillary: 130 mg/dL — ABNORMAL HIGH (ref 70–99)

## 2021-05-12 LAB — TYPE AND SCREEN
ABO/RH(D): O POS
Antibody Screen: NEGATIVE
Unit division: 0
Unit division: 0

## 2021-05-12 LAB — BASIC METABOLIC PANEL
Anion gap: 5 (ref 5–15)
BUN: 21 mg/dL (ref 8–23)
CO2: 30 mmol/L (ref 22–32)
Calcium: 7.9 mg/dL — ABNORMAL LOW (ref 8.9–10.3)
Chloride: 100 mmol/L (ref 98–111)
Creatinine, Ser: 0.95 mg/dL (ref 0.61–1.24)
GFR, Estimated: >60 mL/min (ref >60)
Glucose, Bld: 102 mg/dL — ABNORMAL HIGH (ref 70–99)
Potassium: 4.7 mmol/L (ref 3.5–5.1)
Sodium: 135 mmol/L (ref 135–145)

## 2021-05-12 LAB — CBC
HCT: 28.2 % — ABNORMAL LOW (ref 39.0–52.0)
Hemoglobin: 9.3 g/dL — ABNORMAL LOW (ref 13.0–17.0)
MCH: 30.9 pg (ref 26.0–34.0)
MCHC: 33 g/dL (ref 30.0–36.0)
MCV: 93.7 fL (ref 80.0–100.0)
Platelets: 145 10*3/uL — ABNORMAL LOW (ref 150–400)
RBC: 3.01 MIL/uL — ABNORMAL LOW (ref 4.22–5.81)
RDW: 15.1 % (ref 11.5–15.5)
WBC: 8.4 10*3/uL (ref 4.0–10.5)
nRBC: 0 % (ref 0.0–0.2)

## 2021-05-12 MED ORDER — INSULIN ASPART 100 UNIT/ML IJ SOLN
0.0000 [IU] | Freq: Three times a day (TID) | INTRAMUSCULAR | Status: DC
Start: 2021-05-12 — End: 2021-05-13
  Administered 2021-05-12: 2 [IU] via SUBCUTANEOUS

## 2021-05-12 MED ORDER — LOPERAMIDE HCL 2 MG PO CAPS
2.0000 mg | ORAL_CAPSULE | ORAL | Status: DC | PRN
  Administered 2021-05-12: 2 mg via ORAL
  Filled 2021-05-12: qty 1

## 2021-05-12 MED ORDER — POTASSIUM CHLORIDE CRYS ER 20 MEQ PO TBCR: 20.0000 meq | EXTENDED_RELEASE_TABLET | Freq: Every day | ORAL | Status: DC

## 2021-05-12 NOTE — Progress Notes (Signed)
   05/12/21 0950  Vitals  Temp 97.6 F (36.4 C)  Temp Source Oral  BP 101/85  MAP (mmHg) 92  BP Location Left Arm  BP Method Automatic  Patient Position (if appropriate) Sitting  Pulse Rate 60  Pulse Rate Source Monitor  ECG Heart Rate (!) 121  Resp 20  Level of Consciousness  Level of Consciousness Alert  MEWS COLOR  MEWS Score Color Yellow  Oxygen Therapy  SpO2 100 %  O2 Device Room Air   Patient arrived to 4E03 from [place]. Patient's EPW attached to pacer box, VVI 50. Midsternal incision and R leg incision skin glue c/d/i. Ecchymosis present on leg incision. Patient placed on telemetry and CCMD notified. Paitent oriented to room and call bell left within reach. On-call PA notified about transfer and heart rate/rhythm. Continue current plan of care and vital signs will be initiated q2h x 2 for yellow MEWS.   Gailen Shelter RN

## 2021-05-12 NOTE — Progress Notes (Signed)
Mobility Specialist: Progress Note   05/12/21 1449  Mobility  Activity Ambulated in hall  Level of Assistance Standby assist, set-up cues, supervision of patient - no hands on  Assistive Device Four wheel walker (EVA)  Distance Ambulated (ft) 470 ft  Mobility Ambulated with assistance in hallway  Mobility Response Tolerated well  Mobility performed by Mobility specialist  $Mobility charge 1 Mobility   Pre-Mobility: 129 HR, 96% SpO2 Post-Mobility: 139 HR, 113/67 BP, 100% SpO2  Pt c/o 5/10 CP during ambulation, otherwise asx. Pt is sitting EOB with call bell at his side and family present in the room.   [facility] Mehreen Azizi Mobility Specialist Mobility Specialist Phone: [phone]

## 2021-05-12 NOTE — Progress Notes (Signed)
      [address]       [place] [address]             [phone]        4 Days Post-Op Procedure(s) (LRB): CORONARY ARTERY BYPASS GRAFTING (CABG)X 3 USING LEFT INTERNAL MAMMARY ARTERY AND RIGHT GREATER SAPEHNOUS VEIN (N/A) INDOCYANINE GREEN FLUORESCENCE IMAGING (ICG) (N/A) TRANSESOPHAGEAL ECHOCARDIOGRAM (TEE) (N/A) CLIPPING OF ATRIAL APPENDAGE USING 45 ATRICLIP (Left) APPLICATION OF CELL SAVER (N/A) ENDOVEIN HARVEST OF GREATER SAPHENOUS VEIN (Right)  Subjective: Patient sitting in chair. When asked how he is doing he responded, "I am doing".  Objective: Vital signs in last 24 hours: Temp:  [97.6 F (36.4 C)-97.9 F (36.6 C)] 97.9 F (36.6 C) (07/09 0728) Pulse Rate:  [36-116] 112 (07/09 0400) Cardiac Rhythm: Atrial fibrillation (07/09 0800) Resp:  [11-31] 22 (07/09 0800) BP: (90-109)/(53-89) 93/74 (07/09 0800) SpO2:  [92 %-100 %] 99 % (07/09 0800) Arterial Line BP: (100)/(51) 100/51 (07/08 1000) Weight:  [80.1 kg] 80.1 kg (07/09 0600)  Pre op weight  72.3 kg Current Weight  05/12/21 80.1 kg      Intake/Output from previous day: 07/08 0701 - 07/09 0700 In: 1182.5 [P.O.:1080; I.V.:102.5] Out: 3011 [Urine:3010; Stool:1]   Physical Exam:  Cardiovascular: [facility] Pulmonary: Diminished bibasilar breath sounds Abdomen: Soft, non tender, bowel sounds present. Extremities: ++ bilateral lower extremity edema. Ecchymosis right thigh Wounds: Sternal wound is clean and dry.  No erythema or signs of infection. RLE wound has serous drainage from upper wound;no sign of infection.  Lab Results: CBC: Recent Labs    05/11/21 0403 05/12/21 0411  WBC 11.8* 8.4  HGB 8.9* 9.3*  HCT 27.1* 28.2*  PLT 134* 145*   BMET:  Recent Labs    05/11/21 0403 05/12/21 0411  NA 133* 135  K 4.6 4.7  CL 104 100  CO2 26 30  GLUCOSE 116* 102*  BUN 22 21  CREATININE 0.84 0.95  CALCIUM 7.6* 7.9*    PT/INR:  Lab Results  Component Value Date   INR 1.4 (H)  05/08/2021   INR 1.1 05/04/2021   INR 1.01 09/30/2017   ABG:  INR: Will add last result for INR, ABG once components are confirmed Will add last 4 CBG results once components are confirmed  Assessment/Plan:  1. CV - Had LA clip at surgery. A fib with HR in the 110's.  On Amiodarone 400 mg bid and Midodrine 10 mg tid. BB previously stopped secondary to low BP/HR. . Will likely restart Rivaroxaban once wires removed in am. Will discuss when to start low dose BB with surgeon. May need to restart Amiodarone drip. 2.  Pulmonary - History of COPD. Continue Dulera. On 2-3 liters of oxygen via [place]. Wean as able. CXR this am shows bibasilar atelectasis, probable small left pleural effusion. Encourage incentive spirometer 3. Volume Overload - On Lasix 40 mg IV bid 4.  Expected post op acute blood loss anemia - H and H this am increased to 9.3 and 28.2 5. DM-CBGs 98/100/105. Pre op HGA1C 6. According to last admission, he was to be started on Metformin. Will monitor glucose for now and give Insulin PRN. 6. If no bradycardia today, disconnect wires. Will consider removal or EPW in am. 7. Mild thrombocytopenia-platelets this am up to 145,000  Yoni Lobos M ZimmermanPA-C 05/12/2021,9:48 AM

## 2021-05-12 NOTE — Progress Notes (Signed)
Was just transferred to 4E03, he reports walking "2 miles" this morning before being transferred.  Sitting up in chair, assisted in elevating feet, reviewed IS, patient able to demonstrate.  Instructed to walk with nursing staff again this afternoon. [id number]

## 2021-05-13 ENCOUNTER — Inpatient Hospital Stay (HOSPITAL_COMMUNITY): Payer: Medicare Other

## 2021-05-13 DIAGNOSIS — I25110 Atherosclerotic heart disease of native coronary artery with unstable angina pectoris: Secondary | ICD-10-CM | POA: Diagnosis not present

## 2021-05-13 DIAGNOSIS — J449 Chronic obstructive pulmonary disease, unspecified: Secondary | ICD-10-CM

## 2021-05-13 DIAGNOSIS — I9589 Other hypotension: Secondary | ICD-10-CM

## 2021-05-13 DIAGNOSIS — Z951 Presence of aortocoronary bypass graft: Secondary | ICD-10-CM | POA: Diagnosis not present

## 2021-05-13 DIAGNOSIS — I480 Paroxysmal atrial fibrillation: Secondary | ICD-10-CM

## 2021-05-13 LAB — BASIC METABOLIC PANEL
Anion gap: 7 (ref 5–15)
BUN: 24 mg/dL — ABNORMAL HIGH (ref 8–23)
CO2: 29 mmol/L (ref 22–32)
Calcium: 7.9 mg/dL — ABNORMAL LOW (ref 8.9–10.3)
Chloride: 100 mmol/L (ref 98–111)
Creatinine, Ser: 0.96 mg/dL (ref 0.61–1.24)
GFR, Estimated: >60 mL/min (ref >60)
Glucose, Bld: 142 mg/dL — ABNORMAL HIGH (ref 70–99)
Potassium: 3.9 mmol/L (ref 3.5–5.1)
Sodium: 136 mmol/L (ref 135–145)

## 2021-05-13 LAB — GLUCOSE, CAPILLARY
Glucose-Capillary: 97 mg/dL (ref 70–99)
Glucose-Capillary: 136 mg/dL — ABNORMAL HIGH (ref 70–99)
Glucose-Capillary: 134 mg/dL — ABNORMAL HIGH (ref 70–99)
Glucose-Capillary: 118 mg/dL — ABNORMAL HIGH (ref 70–99)

## 2021-05-13 MED ORDER — ASPIRIN EC 81 MG PO TBEC
81.0000 mg | DELAYED_RELEASE_TABLET | Freq: Every day | ORAL | Status: DC
  Administered 2021-05-13 – 2021-05-21 (×9): 81 mg via ORAL
  Filled 2021-05-13 (×9): qty 1

## 2021-05-13 MED ORDER — ASPIRIN 81 MG PO CHEW
81.0000 mg | CHEWABLE_TABLET | Freq: Every day | ORAL | Status: DC
  Filled 2021-05-13 (×2): qty 1

## 2021-05-13 MED ORDER — POTASSIUM CHLORIDE CRYS ER 20 MEQ PO TBCR
20.0000 meq | EXTENDED_RELEASE_TABLET | Freq: Two times a day (BID) | ORAL | Status: AC
  Administered 2021-05-13 (×2): 20 meq via ORAL
  Filled 2021-05-13 (×2): qty 1

## 2021-05-13 MED ORDER — RIVAROXABAN 20 MG PO TABS
20.0000 mg | ORAL_TABLET | Freq: Every day | ORAL | Status: DC
  Administered 2021-05-14 – 2021-05-17 (×4): 20 mg via ORAL
  Filled 2021-05-13 (×4): qty 1

## 2021-05-13 MED ORDER — POTASSIUM CHLORIDE CRYS ER 20 MEQ PO TBCR: 20.0000 meq | EXTENDED_RELEASE_TABLET | Freq: Two times a day (BID) | ORAL | Status: DC

## 2021-05-13 MED ORDER — RIVAROXABAN 20 MG PO TABS: 20.0000 mg | ORAL_TABLET | Freq: Every day | ORAL | Status: DC

## 2021-05-13 NOTE — Progress Notes (Addendum)
[address]       [place] [address]             [phone]        5 Days Post-Op Procedure(s) (LRB): CORONARY ARTERY BYPASS GRAFTING (CABG)X 3 USING LEFT INTERNAL MAMMARY ARTERY AND RIGHT GREATER SAPEHNOUS VEIN (N/A) INDOCYANINE GREEN FLUORESCENCE IMAGING (ICG) (N/A) TRANSESOPHAGEAL ECHOCARDIOGRAM (TEE) (N/A) CLIPPING OF ATRIAL APPENDAGE USING 45 ATRICLIP (Left) APPLICATION OF CELL SAVER (N/A) ENDOVEIN HARVEST OF GREATER SAPHENOUS VEIN (Right)  Subjective: Patient lying in bed. He states "I want some real food". He had loose stools yesterday.  Objective: Vital signs in last 24 hours: Temp:  [97.6 F (36.4 C)-98.6 F (37 C)] 98 F (36.7 C) (07/10 0334) Pulse Rate:  [60-118] 116 (07/10 0334) Cardiac Rhythm: Atrial fibrillation (07/10 0720) Resp:  [19-20] 19 (07/10 0334) BP: (98-113)/(64-85) 98/66 (07/10 0334) SpO2:  [92 %-100 %] 94 % (07/10 0334) Weight:  [83.7 kg] 83.7 kg (07/10 0640)  Pre op weight  72.3 kg Current Weight  05/13/21 83.7 kg      Intake/Output from previous day: 07/09 0701 - 07/10 0700 In: 600 [P.O.:600] Out: 600 [Urine:600]   Physical Exam:  Cardiovascular: IRRR IRRR Pulmonary: Diminished bibasilar breath sounds L>R Abdomen: Soft, non tender, bowel sounds present. Extremities: ++ bilateral lower extremity edema. Ecchymosis right thigh Wounds: Sternal wound is clean and dry.  No erythema or signs of infection. RLE wound has serous drainage from upper wound;no sign of infection.  Lab Results: CBC: Recent Labs    05/11/21 0403 05/12/21 0411  WBC 11.8* 8.4  HGB 8.9* 9.3*  HCT 27.1* 28.2*  PLT 134* 145*    BMET:  Recent Labs    05/12/21 0411 05/13/21 0112  NA 135 136  K 4.7 3.9  CL 100 100  CO2 30 29  GLUCOSE 102* 142*  BUN 21 24*  CREATININE 0.95 0.96  CALCIUM 7.9* 7.9*     PT/INR:  Lab Results  Component Value Date   INR 1.4 (H) 05/08/2021   INR 1.1 05/04/2021   INR 1.01 09/30/2017   ABG:   INR: Will add last result for INR, ABG once components are confirmed Will add last 4 CBG results once components are confirmed  Assessment/Plan:  1. CV - Had LA clip at surgery. A fib with HR in the 110's.+  His rate is not well controlled. On Amiodarone 400 mg bid and Midodrine 10 mg tid. BB previously stopped secondary to low BP/HR. Will restart Rivaroxaban 07/11.  Asked cardiology to evaluate to help with rate control.  2.  Pulmonary - History of COPD. Continue Dulera. On room air this am. Wean as able. CXR this am shows bibasilar atelectasis, probable small left pleural effusion. Encourage incentive spirometer 3. Volume Overload - On Lasix 40 mg IV bid. With labile BP, will hold diuresis this am. 4.  Expected post op acute blood loss anemia - Last H and H 9.3 and 28.2 5. DM-CBGs 129/130/97. Pre op HGA1C 6. According to last admission, he was to be started on Metformin. I discussed with patient and he states he has been "borderline" and never took any medication. Will stop accu checks and SS PRN and he will need to follow up with his medical doctor after discharge. 6. Will removal or EPW 7. Mild thrombocytopenia-last platelets this am up to 145,000 8. Regarding loose stools, stop stool softeners and Imodium PRN  Donielle M ZimmermanPA-C 05/13/2021,8:05 AM    I  have seen and examined the patient and agree with the assessment and plan as outlined.  Rexene Alberts, MD 05/13/2021 3:27 PM

## 2021-05-13 NOTE — Progress Notes (Signed)
Mobility Specialist: Progress Note   05/13/21 1551  Mobility  Activity Ambulated in hall  Level of Assistance Standby assist, set-up cues, supervision of patient - no hands on  Assistive Device Four wheel walker (EVA)  Distance Ambulated (ft) 470 ft  Mobility Ambulated with assistance in hallway  Mobility Response Tolerated well  Mobility performed by Mobility specialist  $Mobility charge 1 Mobility   Post-Mobility: 85 HR, 94% SpO2  Pt coming out of BR upon entering room with RN present in the room. Pt agreeable to ambulation. Pt c/o feeling a little SOB throughout ambulation, otherwise asx. Pt back to bed after walk per request with call bell and phone at his side.   [facility] Pegi Milazzo Mobility Specialist Mobility Specialist Phone: [phone]

## 2021-05-13 NOTE — Progress Notes (Addendum)
Mobility Specialist: Progress Note   05/13/21 1119  Mobility  Activity Ambulated in hall  Level of Assistance Contact guard assist, steadying assist  Assistive Device Four wheel walker (EVA)  Distance Ambulated (ft) 370 ft  Mobility Ambulated with assistance in hallway  Mobility Response Tolerated well  Mobility performed by Mobility specialist  $Mobility charge 1 Mobility   Pre-Mobility: 82 HR During Mobility: 141 HR Post-Mobility: 128 HR, 121/83 BP, 96% SpO2  Pt c/o chest soreness during ambulation, otherwise asx. Pt to [facility] after walk to have BM. Pt instructed to push call button when he is finished to be assisted back to bed. Pt converted to afib during ambulation, RN notified. Encouraged more walks with staff today.   [facility] Gary Bultman Mobility Specialist Mobility Specialist Phone: [phone]

## 2021-05-13 NOTE — Progress Notes (Signed)
Epicardial pacing wires removed per MD order.  Pt tolerated well. No complication to report at this time.  Pt placed on bedrest with vital checks per protocol.

## 2021-05-13 NOTE — Consult Note (Addendum)
Cardiology Consultation:   Patient ID: Robert Bates MRN: [id number]; DOB: October 28, 1941  Admit date: 05/08/2021 Date of Consult: 05/13/2021  PCP:  [name], NP   [facility] Providers Cardiologist:  Mertie Moores, MD        Patient Profile:   Robert Bates is a 80 y.o. 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 Orvan Seen.  History of Present Illness:   Mr. Baack with prior hx of atrial fib and hx as above, was having more angina underwent cath and found to have LM stenosis, os LCX stenosis and underwent CABG X3 LIMA to LAD, VG to Diag, VG to OM.  Also clipping of atrial appendage on 05/08/21. Pt has progressed but now 5 days post op with atrial fib with HR in the 110s on amiodarone 400 mg BID, Midodrine 10 mg TID for hypotension, has been difficult to use BB.  TCTS to resume Xarelto this evening. He also has volume overload  and has been on lasix 40 IV BID,  he has COPD.  Na 136, K+ 3.9, Cr 0.96   Hgb 9.3, WBC 8.4 plts 145  EKG:  The EKG was personally reviewed and demonstrates:  yesterday's EKG A fib with RVR at 134, non specific T wave abnormalities   Telemetry:  Telemetry was personally reviewed and demonstrates:  a fib RVR.   BP 98/66 to 116/79  pt is + 5L since admit wt up from 72.3 Kg to 83.7 Kg  No real awareness of rapid HR.  Has some chest discomfort - but only complaint is the food is bad.       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   COPD (chronic obstructive pulmonary disease) (HCC)    Coronary artery disease    Diabetes type 2, controlled ([place]) 07/31/2017    Dyspnea    Dysrhythmia    a fib   GERD (gastroesophageal reflux disease)    Hematuria    refuses work up or referral - understands risks of morbidity / mortality - 11/2008, 12/2008   History of hiatal hernia    History of kidney stones    Hyperlipemia    Meningioma ([place]) 10/25/2013   Follows with Dr. [name].    Peripheral vascular disease ([place])    Abdominal Aortic Aneursym   Pneumonia    as a child   Radiation 09/18/15-10/25/15   left lower lobe 70.2 Gy   Seizures ([place]) 02/18/2020   Tobacco abuse     Past Surgical History:  Procedure Laterality Date   CHOLECYSTECTOMY N/A 07/23/2017   Procedure: LAPAROSCOPIC CHOLECYSTECTOMY;  Surgeon: Mickeal Skinner, MD;  Location: [facility];  Service: General;  Laterality: N/A;   CLIPPING OF ATRIAL APPENDAGE Left 05/08/2021   Procedure: CLIPPING OF ATRIAL APPENDAGE USING 103 ATRICLIP;  Surgeon: Wonda Olds, MD;  Location: [place];  Service: Open Heart Surgery;  Laterality: Left;   COLONOSCOPY     CORONARY ARTERY BYPASS GRAFT N/A 05/08/2021   Procedure: CORONARY ARTERY BYPASS GRAFTING (CABG)X 3 USING LEFT INTERNAL MAMMARY  ARTERY AND RIGHT GREATER SAPEHNOUS VEIN;  Surgeon: Wonda Olds, MD;  Location: [place];  Service: Open Heart Surgery;  Laterality: N/A;   ENDOVEIN HARVEST OF GREATER SAPHENOUS VEIN Right 05/08/2021   Procedure: ENDOVEIN HARVEST OF GREATER SAPHENOUS VEIN;  Surgeon: Wonda Olds, MD;  Location: [place];  Service: Open Heart Surgery;  Laterality: Right;   EYE SURGERY Bilateral    Cataracts removed w/ lens implant   HERNIA REPAIR     Left 36 years ago . Right inguinal hernia repair 10-01-17 Dr. Kieth Brightly   INGUINAL HERNIA REPAIR Right 10/01/2017   Procedure: RIGHT INGUINAL HERNIA REPAIR WITH MESH;  Surgeon: Kinsinger, Arta Bruce, MD;  Location: [facility];  Service: General;  Laterality: Right;  TAP BLOCK   INSERTION OF MESH Right 10/01/2017   Procedure: INSERTION OF MESH;  Surgeon: Kieth Brightly Arta Bruce, MD;  [facility];  Service: General;  Laterality: Right;   LEFT HEART CATH AND CORONARY ANGIOGRAPHY N/A 04/20/2021   Procedure: LEFT HEART CATH AND CORONARY ANGIOGRAPHY;  Surgeon: Wellington Hampshire, MD;  Location: [place] [facility];  Service: Cardiovascular;  Laterality: N/A;   TEE WITHOUT CARDIOVERSION N/A 05/08/2021   Procedure: TRANSESOPHAGEAL ECHOCARDIOGRAM (TEE);  Surgeon: Wonda Olds, MD;  Location: [place];  Service: Open Heart Surgery;  Laterality: N/A;   TONSILLECTOMY     TONSILLECTOMY     VIDEO BRONCHOSCOPY Bilateral 07/26/2015   Procedure: VIDEO BRONCHOSCOPY WITH FLUORO;  Surgeon: [name], MD;  [facility];  Service: Cardiopulmonary;  Laterality: Bilateral;   VIDEO BRONCHOSCOPY WITH ENDOBRONCHIAL NAVIGATION N/A 08/23/2015   Procedure: VIDEO BRONCHOSCOPY WITH ENDOBRONCHIAL NAVIGATION;  Surgeon: Grace Isaac, MD;  Location: [place];  Service: Thoracic;  Laterality: N/A;   VIDEO BRONCHOSCOPY WITH ENDOBRONCHIAL ULTRASOUND N/A 08/23/2015   Procedure: VIDEO BRONCHOSCOPY WITH ENDOBRONCHIAL ULTRASOUND;  Surgeon: Grace Isaac, MD;  Location: [place];  Service: Thoracic;  Laterality: N/A;     Home Medications:  Prior to Admission medications   Medication Sig Start Date End Date Taking? Authorizing Provider  acetaminophen (TYLENOL) 500 MG tablet Take 500-1,000 mg by mouth every 6 (six) hours as needed.   Yes [provider]  albuterol (VENTOLIN HFA) 108 (90 Base) MCG/ACT inhaler Inhale 2 puffs into the lungs every 6 (six) hours as needed for wheezing or shortness of breath. 12/08/20  Yes [name], NP  amiodarone (PACERONE) 200 MG tablet Take 200mg  (1 tablet) twice daily for 7 days (last day 05/02/21), then take 200mg  (1 tablet) daily Patient taking differently: Take 200 mg by mouth See admin instructions. Take 200mg  (1 tablet) twice daily for 7 days (last day 05/02/21), then take 200mg  (1 tablet) daily 04/26/21  Yes Margie Billet, NP  aspirin EC 81 MG tablet Take 1  tablet (81 mg total) by mouth daily. Swallow whole. Patient taking differently: Take 81 mg by mouth in the morning. Swallow whole. 04/26/21  Yes Margie Billet, NP  atorvastatin (LIPITOR) 80 MG tablet Take 1 tablet (80 mg total) by mouth daily. Patient taking differently: Take 80 mg by mouth in the morning. 04/26/21  Yes Margie Billet, NP  mometasone-formoterol (DULERA) 200-5 MCG/ACT AERO Inhale 2 puffs into the lungs 2 (two) times daily.   Yes [provider]  Multiple Vitamin (MULTIVITAMIN WITH MINERALS) TABS tablet Take 1 tablet by mouth daily in the afternoon.   Yes [provider]  nitroGLYCERIN (NITROSTAT) 0.4 MG SL tablet Place 1 tablet (0.4 mg total) under the tongue every  5 (five) minutes as needed for chest pain. Patient taking differently: Place 0.4 mg under the tongue every 5 (five) minutes x 3 doses as needed for chest pain. 04/26/21  Yes Margie Billet, NP  omeprazole (PRILOSEC) 40 MG capsule Take 1 capsule (40 mg total) by mouth daily. 03/13/21  Yes [name], NP  rivaroxaban (XARELTO) 20 MG TABS tablet TAKE 1 TABLET BY MOUTH DAILY WITH SUPPER Patient taking differently: Take 20 mg by mouth daily with supper. 04/11/21  Yes Nahser, Wonda Cheng, MD  tamsulosin (FLOMAX) 0.4 MG CAPS capsule TAKE 1 CAPSULE (0.4 MG TOTAL) BY MOUTH DAILY. Patient taking differently: Take 0.4 mg by mouth in the morning. 03/13/21 03/13/22 Yes [name], NP  metFORMIN (GLUCOPHAGE) 500 MG tablet Take 1 tablet (500 mg total) by mouth 2 (two) times daily with a meal. 04/26/21 05/08/21 Yes Margie Billet, NP  fluticasone-salmeterol (ADVAIR HFA) 115-21 MCG/ACT inhaler Inhale 2 puffs into the lungs 2 (two) times daily. Patient not taking: Reported on 04/27/2021 04/11/21   [name], NP    Inpatient Medications: Scheduled Meds:  acetaminophen  1,000 mg Oral Q6H   Or   acetaminophen (TYLENOL) oral liquid 160 mg/5 mL  1,000 mg Per Tube Q6H   amiodarone  400 mg Oral BID   aspirin EC  325 mg Oral  Daily   Or   aspirin  324 mg Per Tube Daily   atorvastatin  80 mg Oral Daily   Chlorhexidine Gluconate Cloth  6 each Topical Daily   colchicine  0.6 mg Oral BID   furosemide  40 mg Intravenous BID   mouth rinse  15 mL Mouth Rinse BID   midodrine  10 mg Oral TID WC   mometasone-formoterol  2 puff Inhalation BID   multivitamin with minerals  1 tablet Oral Q1500   pantoprazole  40 mg Oral Daily   polyethylene glycol  17 g Oral Daily   potassium chloride  20 mEq Oral BID   sodium chloride flush  10-40 mL Intracatheter Q12H   sodium chloride flush  3 mL Intravenous Q12H   tamsulosin  0.4 mg Oral q AM   Continuous Infusions:  lactated ringers     PRN Meds: dextrose, lactated ringers, loperamide, metoprolol tartrate, ondansetron (ZOFRAN) IV, oxyCODONE, traMADol  Allergies:    Allergies  Allergen Reactions   Iodine Other (See Comments)    neck swells   Iohexol Swelling    Neck and gland swelling per patient.    Social History:   Social History   Socioeconomic History   Marital status: Married    Spouse name: Not on file   Number of children: 2   Years of education: Not on file   Highest education level: Not on file  Occupational History   Occupation: Retired    Fish farm manager: [employer]    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[place] Use   Smoking status: Former    Packs/day: 1.00    Years: 57.00    Pack years: 57.00    Types: Cigarettes, Cigars    Quit date: 08/08/2015    Years since quitting: 5.7   Smokeless tobacco: Former    Types: Chew    Quit date: 11/04/1958   Tobacco comments:    occasional use sneaks around  Vaping Use   Vaping Use: Former  Substance and Sexual Activity   Alcohol use: Not Currently    Alcohol/week: 0.0 standard drinks   Drug use: No   Sexual activity:  Yes  Other Topics Concern   Not on file  Social History Narrative   Not on file   Social Determinants of Health   Financial Resource Strain: Medium Risk   Difficulty of  Paying Living Expenses: Somewhat hard  Food Insecurity: Not on file  Transportation Needs: Not on file  Physical Activity: Inactive   Days of Exercise per Week: 0 days   Minutes of Exercise per Session: 0 min  Stress: Not on file  Social Connections: Not on file  Intimate Partner Violence: Not on file    Family History:    Family History  Problem Relation Age of Onset   Leukemia Father    Emphysema Father    Learning disabilities Son    Leukemia Other    Stroke Other      ROS:  Please see the history of present illness.  General:no colds or fevers, + weight increase since surgery Skin:no rashes or ulcers HEENT:no blurred vision, no congestion CV:see HPI PUL:see HPI GI:no diarrhea constipation or melena, no indigestion GU:no hematuria, no dysuria MS:no joint pain, no claudication Neuro:no syncope, no lightheadedness, hx seizures Endo:+ diabetes, no thyroid disease  All other ROS reviewed and negative.     Physical Exam/Data:   Vitals:   05/12/21 2024 05/12/21 2310 05/13/21 0334 05/13/21 0640  BP:  102/64 98/66   Pulse: (!) 118 (!) 115 (!) 116   Resp:  20 19   Temp:  97.7 F (36.5 C) 98 F (36.7 C)   TempSrc:  Oral Oral   SpO2: 97% 92% 94%   Weight:    83.7 kg  Height:        Intake/Output Summary (Last 24 hours) at 05/13/2021 0824 Last data filed at 05/12/2021 1918 Gross per 24 hour  Intake 240 ml  Output 600 ml  Net -360 ml   Last 3 Weights 05/13/2021 05/12/2021 05/11/2021  Weight (lbs) 184 lb 8.4 oz 176 lb 9.4 oz 179 lb 10.8 oz  Weight (kg) 83.7 kg 80.1 kg 81.5 kg     Body mass index is 27.25 kg/m.  General:  thin male, in no acute distress  HEENT: normal Lymph: no adenopathy Neck: mild JVD Endocrine:  No thryomegaly Vascular: No carotid bruits; pedal pulses 1+ bilaterally difficult to palpate with edema Cardiac:  normal S1, S2; RRR; no murmur gallup rub or click,  pacing wires in place  Lungs:  clear to auscultation bilaterally, no wheezing, rhonchi  few rales in bases Abd: soft, nontender, no hepatomegaly  Ext: 2-3+ lower ext edema Musculoskeletal:  No deformities, BUE and BLE strength normal and equal Skin: warm and dry  Neuro:  alert and oriented X 3 MAE follows commands no focal abnormalities noted Psych:  Normal affect   Relevant CV Studies: Cardiac cath 04/20/21  Mid LM to Dist LM lesion is 70% stenosed. Ost Cx to Prox Cx lesion is 70% stenosed. Mid Cx lesion is 90% stenosed. 3rd Mrg lesion is 100% stenosed. Ost LAD to Prox LAD lesion is 80% stenosed. 1st Diag lesion is 70% stenosed. Ramus lesion is 60% stenosed.   1.  Significant heavily calcified distal left main disease with significant calcified disease affecting the proximal LAD, large first diagonal, ostial ramus and left circumflex.  Occluded OM 3 with collaterals.  The coronary arteries are codominant with no obstructive disease affecting the right coronary artery. 2.  Normal LV systolic function and left ventricular end-diastolic pressure.   Recommendations: The patient was in sinus rhythm on presentation but  went into A. fib with RVR during catheterization.  He was given 5 mg of IV metoprolol but was still tachycardic.  Due to this and given his significant coronary artery disease, recommend admission to the hospital. Start heparin drip 6 hours after sheath pull. Recommend rate control for atrial fibrillation.  I started oral metoprolol and if rate is still not controlled, diltiazem drip or amiodarone drip can be considered. I requested an echocardiogram. I will consult CT surgery for CABG evaluation.   Diagnostic Dominance: Co-dominant                   ECHO 04/22/21 IMPRESSIONS     1. Left ventricular ejection fraction, by estimation, is 60 to 65%. The  left ventricle has normal function. The left ventricle has no regional  wall motion abnormalities. Left ventricular diastolic parameters are  consistent with Grade II diastolic  dysfunction  (pseudonormalization). Elevated left ventricular end-diastolic  pressure. The E/e' is 66.   2. Right ventricular systolic function is mildly reduced. The right  ventricular size is normal. There is mildly elevated pulmonary artery  systolic pressure. The estimated right ventricular systolic pressure is  73.2 mmHg.   3. The mitral valve is grossly normal. Trivial mitral valve  regurgitation.   4. The aortic valve is tricuspid. Aortic valve regurgitation is not  visualized.   5. Aortic dilatation noted. There is mild dilatation of the ascending  aorta, measuring 40 mm.   6. The inferior vena cava is dilated in size with <50% respiratory  variability, suggesting right atrial pressure of 15 mmHg.   FINDINGS   Left Ventricle: Left ventricular ejection fraction, by estimation, is 60  to 65%. The left ventricle has normal function. The left ventricle has no  regional wall motion abnormalities. Definity contrast agent was given IV  to delineate the left ventricular   endocardial borders. The left ventricular internal cavity size was normal  in size. There is no left ventricular hypertrophy. Left ventricular  diastolic parameters are consistent with Grade II diastolic dysfunction  (pseudonormalization). Elevated left  ventricular end-diastolic pressure. The E/e' is 52.   Right Ventricle: The right ventricular size is normal. No increase in  right ventricular wall thickness. Right ventricular systolic function is  mildly reduced. There is mildly elevated pulmonary artery systolic  pressure. The tricuspid regurgitant velocity   is 2.55 m/s, and with an assumed right atrial pressure of 15 mmHg, the  estimated right ventricular systolic pressure is 20.2 mmHg.   Left Atrium: Left atrial size was normal in size.   Right Atrium: Right atrial size was normal in size.   Pericardium: There is no evidence of pericardial effusion.   Mitral Valve: The mitral valve is grossly normal. Trivial mitral  valve  regurgitation.   Tricuspid Valve: The tricuspid valve is grossly normal. Tricuspid valve  regurgitation is trivial.   Aortic Valve: The aortic valve is tricuspid. Aortic valve regurgitation is  not visualized.   Pulmonic Valve: The pulmonic valve was normal in structure. Pulmonic valve  regurgitation is not visualized.   Aorta: Aortic dilatation noted. There is mild dilatation of the ascending  aorta, measuring 40 mm.   Venous: The inferior vena cava is dilated in size with less than 50%  respiratory variability, suggesting right atrial pressure of 15 mmHg.   IAS/Shunts: No atrial level shunt detected by color flow Doppler.   Laboratory Data:  High Sensitivity Troponin:  No results for input(s): TROPONINIHS in the last 720 hours.  Chemistry Recent Labs  Lab 05/11/21 0403 05/12/21 0411 05/13/21 0112  NA 133* 135 136  K 4.6 4.7 3.9  CL 104 100 100  CO2 26 30 29   GLUCOSE 116* 102* 142*  BUN 22 21 24*  CREATININE 0.84 0.95 0.96  CALCIUM 7.6* 7.9* 7.9*  GFRNONAA >60 >60 >60  ANIONGAP 3* 5 7    No results for input(s): PROT, ALBUMIN, AST, ALT, ALKPHOS, BILITOT in the last 168 hours. Hematology Recent Labs  Lab 05/10/21 0430 05/11/21 0403 05/12/21 0411  WBC 11.6* 11.8* 8.4  RBC 2.74* 2.89* 3.01*  HGB 8.4* 8.9* 9.3*  HCT 26.1* 27.1* 28.2*  MCV 95.3 93.8 93.7  MCH 30.7 30.8 30.9  MCHC 32.2 32.8 33.0  RDW 14.6 14.6 15.1  PLT 152 134* 145*   BNPNo results for input(s): BNP, PROBNP in the last 168 hours.  DDimer No results for input(s): DDIMER in the last 168 hours.   Radiology/Studies:  DG Chest 2 View  Result Date: 05/11/2021 CLINICAL DATA:  .  Shortness of breath. EXAM: CHEST - 2 VIEW COMPARISON:  05/10/2021 FINDINGS: Left parahilar opacity is similar to prior with bibasilar atelectasis/infiltrate not substantially changed in the interval. Tiny effusions bilaterally. Right IJ sheath again noted. Telemetry leads overlie the chest. IMPRESSION: No  substantial change. Electronically Signed   By: Misty Stanley M.D.   On: 05/11/2021 09:24   DG Chest Port 1 View  Result Date: 05/12/2021 CLINICAL DATA:  History of open heart surgery. EXAM: PORTABLE CHEST 1 VIEW COMPARISON:  Chest x-rays dated 05/11/2021 and 05/10/2021. FINDINGS: Heart size and mediastinal contours are stable. Median sternotomy wires appear intact and stable in configuration. LEFT atrial appendage clip again noted. Persistent opacity at the LEFT lung base, likely atelectasis and/or small pleural effusion. Probable mild atelectasis at the RIGHT lung base. Previously described radiation changes in the LEFT mid lung region. No new lung findings. No pneumothorax is seen. IMPRESSION: Stable chest x-ray. Probable bibasilar atelectasis. Probable small LEFT pleural effusion. No new lung findings. Electronically Signed   By: Franki Cabot M.D.   On: 05/12/2021 08:27   DG Chest Port 1 View  Result Date: 05/10/2021 CLINICAL DATA:  Chest tube.  Open-heart surgery. EXAM: PORTABLE CHEST 1 VIEW COMPARISON:  05/09/2021. FINDINGS: Interim removal Swan-Ganz catheter. Right IJ sheath in stable position. Mediastinal drainage catheter and left chest tube in stable position. Prior median sternotomy. Left atrial appendage clip in stable position. Stable cardiomegaly. Low lung volumes with bibasilar atelectasis and infiltrates. Post radiation change again noted the left mid lung without interim change. Stable left base pleural thickening. No pneumothorax. IMPRESSION: 1. Interim removal Swan-Ganz catheter. Right IJ sheath, mediastinal drainage catheter, left chest tube in stable position. No pneumothorax. 2.  Prior median sternotomy.  Stable cardiomegaly. 3. Low lung volumes. Mild bibasilar atelectasis and infiltrates. Stable post radiation change left mid lung. Electronically Signed   By: Marcello Moores  Register   On: 05/10/2021 08:41     Assessment and Plan:   PAF post op with a hx pre op PAF but has been in [place]  until yesterday as outpt was on dilt 120 daily anticoagualtion with xarelto to resume today  on amiodarone 400 BID has rec'd 5 doses counting today.   CAD with CABG X 3 05/08/21- Lt atrial clipping as well.  Chest tube out, pacing wires to be removed today. Soft BP on midodrine 10 mg TID started on 05/10/21 this has made it difficult to give BB is only on prn.  Lower ext edema and + 5 L since admit has been receiving lasix 40 BID held today, CXR today with small Lt pl effusion, Previously described radiation change within the LEFT mid lung region is stable COPD with Lung cancer follows wit oncology and uses multiple inhalers no wheezes currently HLD on lipitor 80 last LDL 04/11/21 with HDL 63, LDL 62 TG 119 and Tchol 146   Tobacco use was using occ.  He is aware he must stop.   Risk Assessment/Risk Scores:          CHA2DS2-VASc Score = 3  This indicates a 3.2% annual risk of stroke. The patient's score is based upon: CHF History: No HTN History: No Diabetes History: No Stroke History: No Vascular Disease History: Yes Age Score: 2 Gender Score: 0         For questions or updates, please contact [place] Please consult [email or web address] for contact info under    Signed, Cecilie Kicks, NP  05/13/2021 8:24 AM  I have seen and examined the patient along with Cecilie Kicks, NP .  I have reviewed the chart, notes and new data.  I agree with PA/NP's note.  Key new complaints: only aware of palpitations, but arrhythmia is otherwise asymptomatic Key examination changes: lying fully supine without breathing difficulty, clear lungs, RRR, no rubs or murmur, no edema Key new findings / data: currently back in NSR at 70 bpm. Echo reviewed - normal LVEF, but evidence of diastolic dysfunction and elevated filling pressures (preop)  PLAN: Continue amiodarone. Expect prevalence of AF to decrease with recovery postop, but he had AFib even before CABG. Avoid beta blockers due to hypotension. Resume  Xarelto after pacemaker wires Dc'd, but long term OK to interrupt anticoagulation if necessary (s/p LA appendage clipping).  Sanda Klein, MD, [employer] [phone] 05/13/2021, 10:05 AM

## 2021-05-14 DIAGNOSIS — E8779 Other fluid overload: Secondary | ICD-10-CM | POA: Diagnosis not present

## 2021-05-14 DIAGNOSIS — I4891 Unspecified atrial fibrillation: Secondary | ICD-10-CM | POA: Diagnosis not present

## 2021-05-14 DIAGNOSIS — Z951 Presence of aortocoronary bypass graft: Secondary | ICD-10-CM | POA: Diagnosis not present

## 2021-05-14 DIAGNOSIS — I25110 Atherosclerotic heart disease of native coronary artery with unstable angina pectoris: Secondary | ICD-10-CM | POA: Diagnosis not present

## 2021-05-14 LAB — GLUCOSE, CAPILLARY
Glucose-Capillary: 125 mg/dL — ABNORMAL HIGH (ref 70–99)
Glucose-Capillary: 128 mg/dL — ABNORMAL HIGH (ref 70–99)
Glucose-Capillary: 122 mg/dL — ABNORMAL HIGH (ref 70–99)
Glucose-Capillary: 122 mg/dL — ABNORMAL HIGH (ref 70–99)

## 2021-05-14 MED ORDER — ENSURE ENLIVE PO LIQD: 237.0000 mL | Freq: Three times a day (TID) | ORAL | Status: DC

## 2021-05-14 MED ORDER — COLCHICINE 0.3 MG HALF TABLET
0.3000 mg | ORAL_TABLET | Freq: Every day | ORAL | Status: DC
  Filled 2021-05-14: qty 1

## 2021-05-14 MED ORDER — ASPIRIN EC 81 MG PO TBEC: 81.0000 mg | DELAYED_RELEASE_TABLET | Freq: Every morning | ORAL | Status: DC

## 2021-05-14 MED ORDER — METOPROLOL TARTRATE 12.5 MG HALF TABLET
12.5000 mg | ORAL_TABLET | Freq: Two times a day (BID) | ORAL | Status: DC
  Administered 2021-05-14 – 2021-05-16 (×4): 12.5 mg via ORAL
  Filled 2021-05-14 (×5): qty 1

## 2021-05-14 MED ORDER — AMIODARONE IV BOLUS ONLY 150 MG/100ML
150.0000 mg | Freq: Once | INTRAVENOUS | Status: AC
  Administered 2021-05-14: 150 mg via INTRAVENOUS
  Filled 2021-05-14: qty 100

## 2021-05-14 MED ORDER — TORSEMIDE 20 MG PO TABS
20.0000 mg | ORAL_TABLET | Freq: Every day | ORAL | Status: DC
  Administered 2021-05-14: 20 mg via ORAL
  Filled 2021-05-14: qty 1

## 2021-05-14 MED ORDER — POTASSIUM CHLORIDE CRYS ER 20 MEQ PO TBCR
20.0000 meq | EXTENDED_RELEASE_TABLET | Freq: Once | ORAL | Status: AC
  Administered 2021-05-14: 20 meq via ORAL
  Filled 2021-05-14: qty 1

## 2021-05-14 MED ORDER — ENSURE ENLIVE PO LIQD
237.0000 mL | Freq: Three times a day (TID) | ORAL | Status: DC
  Administered 2021-05-14 – 2021-05-21 (×17): 237 mL via ORAL

## 2021-05-14 MED ORDER — POLYETHYLENE GLYCOL 3350 17 G PO PACK: 17.0000 g | PACK | Freq: Every day | ORAL | Status: DC | PRN

## 2021-05-14 NOTE — Progress Notes (Signed)
CARDIAC REHAB PHASE I   Returned to offer ambulation with pt. Pt very fatigued, ambulated with mobility tech. Does not feel as strong today. Encouraged continued ambulation later today. HR 60s SR with PVCs. Will continue to follow.  [id number] Rufina Falco, RN BSN 05/14/2021 2:38 PM

## 2021-05-14 NOTE — Care Management Important Message (Signed)
Important Message  Patient Details  Name: Robert Bates MRN: [id number] Date of Birth: [birth year]   Medicare Important Message Given:  Yes     Shelda Altes 05/14/2021, 9:54 AM

## 2021-05-14 NOTE — Progress Notes (Addendum)
      [address]       [place] [address]             [phone]        6 Days Post-Op Procedure(s) (LRB): CORONARY ARTERY BYPASS GRAFTING (CABG)X 3 USING LEFT INTERNAL MAMMARY ARTERY AND RIGHT GREATER SAPEHNOUS VEIN (N/A) INDOCYANINE GREEN FLUORESCENCE IMAGING (ICG) (N/A) TRANSESOPHAGEAL ECHOCARDIOGRAM (TEE) (N/A) CLIPPING OF ATRIAL APPENDAGE USING 45 ATRICLIP (Left) APPLICATION OF CELL SAVER (N/A) ENDOVEIN HARVEST OF GREATER SAPHENOUS VEIN (Right)  Subjective: Patient states still with some loose stools. He is very tired this morning  Objective: Vital signs in last 24 hours: Temp:  [97.7 F (36.5 C)-99.7 F (37.6 C)] 98.2 F (36.8 C) (07/11 0614) Pulse Rate:  [76-122] 122 (07/11 0614) Cardiac Rhythm: Normal sinus rhythm (07/11 0335) Resp:  [18-20] 20 (07/11 0614) BP: (102-136)/(59-95) 132/95 (07/11 0614) SpO2:  [90 %-98 %] 98 % (07/11 0614) Weight:  [76.7 kg] 76.7 kg (07/11 0510)  Pre op weight  72.3 kg Current Weight  05/14/21 76.7 kg      Intake/Output from previous day: 07/10 0701 - 07/11 0700 In: -  Out: 200 [Urine:200]   Physical Exam:  Cardiovascular: Tachycardic  Pulmonary: Diminished bibasilar breath sounds L>R Abdomen: Soft, non tender, bowel sounds present. Extremities: ++ bilateral lower extremity edema. Ecchymosis right thigh Wounds: Sternal wound is clean and dry.  No erythema or signs of infection. RLE wound  clean and dry  Lab Results: CBC: Recent Labs    05/12/21 0411  WBC 8.4  HGB 9.3*  HCT 28.2*  PLT 145*    BMET:  Recent Labs    05/12/21 0411 05/13/21 0112  NA 135 136  K 4.7 3.9  CL 100 100  CO2 30 29  GLUCOSE 102* 142*  BUN 21 24*  CREATININE 0.95 0.96  CALCIUM 7.9* 7.9*     PT/INR:  Lab Results  Component Value Date   INR 1.4 (H) 05/08/2021   INR 1.1 05/04/2021   INR 1.01 09/30/2017   ABG:  INR: Will add last result for INR, ABG once components are confirmed Will add last 4 CBG results  once components are confirmed  Assessment/Plan:  1. CV - Had LA clip at surgery. Appears ST this am. On Amiodarone 400 mg bid and Midodrine 10 mg tid. Will restart Rivaroxaban this evening.  Cardiology following as of yesterday. BP seems improved so will try low dose Lopressor 2.  Pulmonary - History of COPD. Continue Dulera. On 2 liters of oxygen via . Wean as able.  3. Volume Overload - Will give low dose Torsemide this am 4.  Expected post op acute blood loss anemia - Last H and H 9.3 and 28.2 6. Mild thrombocytopenia-last platelets this am up to 145,000 7. Deconditioned-PT/OT 8. GI-Imodium PRN. Will ask nutrition to evaluate as poor po intake   Brittani Purdum M ZimmermanPA-C 05/14/2021,6:54 AM

## 2021-05-14 NOTE — Progress Notes (Signed)
PT Cancellation Note  Patient Details Name: Robert Bates MRN: [id number] DOB: May 05, 1941   Cancelled Treatment:    Reason Eval/Treat Not Completed: Fatigue/lethargy limiting ability to participate. Patient sleeping, declines PT at this time. Will re-attempt tomorrow.    Angel Hobdy 05/14/2021, 2:57 PM

## 2021-05-14 NOTE — Progress Notes (Signed)
PT Cancellation Note  Patient Details Name: Robert Bates MRN: [id number] DOB: 01-Mar-1941   Cancelled Treatment:    Reason Eval/Treat Not Completed: Medical issues which prohibited therapy. Patient's resting HR at 128 currently. Will hold and re-attempt later today as appropriate. He has been ambulating with Mobility specialist in halls prior to today.    Hassan Blackshire 05/14/2021, 11:23 AM

## 2021-05-14 NOTE — Progress Notes (Signed)
CARDIAC REHAB PHASE I   Came to walk pt. Pt stated that he did not feel like walking with his high HR of 117 ST. Will follow up later.   Sheppard Plumber, MS, ACM-CEP 05/14/2021 9:12 AM

## 2021-05-14 NOTE — Progress Notes (Signed)
Initial Nutrition Assessment  DOCUMENTATION CODES:   Non-severe (moderate) malnutrition in context of chronic illness  INTERVENTION:   Ensure Enlive po TID, each supplement provides 350 kcal and 20 grams of protein Continue MVI with minerals daily  NUTRITION DIAGNOSIS:   Moderate Malnutrition related to chronic illness (COPD, CAD) as evidenced by mild muscle depletion, mild fat depletion.  GOAL:   Patient will meet greater than or equal to 90% of their needs  MONITOR:   PO intake, Supplement acceptance, Labs  REASON FOR ASSESSMENT:   Consult Assessment of nutrition requirement/status  ASSESSMENT:   80 yo male admitted with PAF, L main CAD. S/P CABG x 3 on 7/5. PMH includes HLD, GERD, tobacco abuse, meningioma, PVD, DM-2, COPD, lung cancer, CAD, seizures.  Spoke with patient, his wife, and his sister-in-law at bedside. He has been eating poorly since admission d/t dislike of the hospital food and altered taste. He thinks he has lost some weight, unsure of amount. Usual weights reviewed. He has had a 8% weight loss within the past 6 months. Patient agreed to try Ensure supplements between meals to maximize protein intake.  Currently on a heart healthy carb modified diet. Meal completions: 25-80%  Labs reviewed.  CBG: 125-128  Medications reviewed and include MVI with minerals, protonix, flomax.   NUTRITION - FOCUSED PHYSICAL EXAM:  Flowsheet Row Most Recent Value  Orbital Region Mild depletion  Upper Arm Region Moderate depletion  Thoracic and Lumbar Region Mild depletion  Buccal Region Mild depletion  Temple Region Moderate depletion  Clavicle Bone Region Mild depletion  Clavicle and Acromion Bone Region Mild depletion  Scapular Bone Region Mild depletion  Dorsal Hand Mild depletion  Patellar Region Mild depletion  Anterior Thigh Region Mild depletion  Posterior Calf Region No depletion  Edema (RD Assessment) Moderate  Hair Reviewed  Eyes Reviewed  Mouth  Reviewed  Skin Reviewed  Nails Reviewed       Diet Order:   Diet Order             Diet heart healthy/carb modified Room service appropriate? Yes with Assist; Fluid consistency: Thin  Diet effective now                   EDUCATION NEEDS:   Education needs have been addressed  Skin:  Skin Assessment: Reviewed RN Assessment (surgical incisions to chest and R leg)  Last BM:  7/10  Height:   Ht Readings from Last 1 Encounters:  05/08/21 5\' 9"  (1.753 m)    Weight:   Wt Readings from Last 1 Encounters:  05/14/21 76.7 kg    Ideal Body Weight:  72.7 kg  BMI:  Body mass index is 24.96 kg/m.  Estimated Nutritional Needs:   Kcal:  2000-2200  Protein:  100-120 gm  Fluid:  2-2.2 L    Lucas Mallow, RD, LDN, CNSC Please refer to Amion for contact information.

## 2021-05-14 NOTE — Progress Notes (Signed)
Mobility Specialist: Progress Note   05/14/21 1139  Mobility  Activity Ambulated in hall  Level of Assistance Standby assist, set-up cues, supervision of patient - no hands on  Assistive Device Four wheel walker (EVA)  Distance Ambulated (ft) 210 ft  Mobility Ambulated with assistance in hallway  Mobility Response Tolerated fair  Mobility performed by Mobility specialist  $Mobility charge 1 Mobility   Pre-Mobility: 125 HR, 110/73 BP, 94% SpO2 Post-Mobility: 130 HR  Pt c/o feeling fatigued but agreeable to ambulate. Pt c/o feeling dizzy halfway through ambulation as well as feeling SOB so pt turned around to go back to the room. Pt to BR after returning to the room and then back to the bed. Pt is sitting EOB and is set-up to order his lunch, RN present in the room.   [facility] Shaunette Gassner Mobility Specialist Mobility Specialist Phone: [phone]

## 2021-05-14 NOTE — Progress Notes (Addendum)
Progress Note  Patient Name: Alexei Doswell Date of Encounter: 05/14/2021  [facility] Cardiologist: Mertie Moores, MD   Subjective   Feeling rough this morning. Ambulated in the hallway but low energy. Remains in afib RVR.   Inpatient Medications    Scheduled Meds:  amiodarone  400 mg Oral BID   aspirin EC  81 mg Oral Daily   Or   aspirin  81 mg Per Tube Daily   atorvastatin  80 mg Oral Daily   Chlorhexidine Gluconate Cloth  6 each Topical Daily   [START ON 05/15/2021] colchicine  0.3 mg Oral Daily   mouth rinse  15 mL Mouth Rinse BID   metoprolol tartrate  12.5 mg Oral BID   midodrine  10 mg Oral TID WC   mometasone-formoterol  2 puff Inhalation BID   multivitamin with minerals  1 tablet Oral Q1500   pantoprazole  40 mg Oral Daily   polyethylene glycol  17 g Oral Daily   rivaroxaban  20 mg Oral Q supper   sodium chloride flush  10-40 mL Intracatheter Q12H   sodium chloride flush  3 mL Intravenous Q12H   tamsulosin  0.4 mg Oral q AM   torsemide  20 mg Oral Daily   Continuous Infusions:  amiodarone     lactated ringers     PRN Meds: dextrose, lactated ringers, loperamide, metoprolol tartrate, ondansetron (ZOFRAN) IV, oxyCODONE, traMADol   Vital Signs    Vitals:   05/14/21 0810 05/14/21 0900 05/14/21 1000 05/14/21 1115  BP:    110/73  Pulse: (!) 114 (!) 121 (!) 113 (!) 128  Resp: (!) 23 20 20 20   Temp:    (!) 97.4 F (36.3 C)  TempSrc:    Oral  SpO2: 100% 100% 100% 93%  Weight:      Height:        Intake/Output Summary (Last 24 hours) at 05/14/2021 1134 Last data filed at 05/14/2021 1123 Gross per 24 hour  Intake --  Output 1000 ml  Net -1000 ml   Last 3 Weights 05/14/2021 05/13/2021 05/12/2021  Weight (lbs) 169 lb 184 lb 8.4 oz 176 lb 9.4 oz  Weight (kg) 76.658 kg 83.7 kg 80.1 kg      Telemetry    Afib RVR - Personally Reviewed  ECG    No new tracing  Physical Exam   GEN: No acute distress.   Neck: No JVD Cardiac: Irreg Irreg, no murmurs,  mild pericardial rub, no gallops.  Respiratory: Diminished bases GI: Soft, nontender, non-distended  MS: 1-2+ bilateral LE edema; No deformity. Neuro:  Nonfocal  Psych: Normal affect   Labs    High Sensitivity Troponin:  No results for input(s): TROPONINIHS in the last 720 hours.    Chemistry Recent Labs  Lab 05/11/21 0403 05/12/21 0411 05/13/21 0112  NA 133* 135 136  K 4.6 4.7 3.9  CL 104 100 100  CO2 26 30 29   GLUCOSE 116* 102* 142*  BUN 22 21 24*  CREATININE 0.84 0.95 0.96  CALCIUM 7.6* 7.9* 7.9*  GFRNONAA >60 >60 >60  ANIONGAP 3* 5 7     Hematology Recent Labs  Lab 05/10/21 0430 05/11/21 0403 05/12/21 0411  WBC 11.6* 11.8* 8.4  RBC 2.74* 2.89* 3.01*  HGB 8.4* 8.9* 9.3*  HCT 26.1* 27.1* 28.2*  MCV 95.3 93.8 93.7  MCH 30.7 30.8 30.9  MCHC 32.2 32.8 33.0  RDW 14.6 14.6 15.1  PLT 152 134* 145*    BNPNo results for  input(s): BNP, PROBNP in the last 168 hours.   DDimer No results for input(s): DDIMER in the last 168 hours.   Radiology    DG Chest 2 View  Result Date: 05/13/2021 CLINICAL DATA:  Pleural effusion. EXAM: CHEST - 2 VIEW COMPARISON:  Chest x-ray dated 05/12/2021. FINDINGS: Small LEFT pleural effusion, with probable associated atelectasis. RIGHT lung is clear. Previously described radiation change within the LEFT mid lung region is unchanged. Heart size and mediastinal contours are stable. No pneumothorax is seen. IMPRESSION: 1. Small LEFT pleural effusion. 2. Previously described radiation change within the LEFT mid lung region is stable. 3. No new lung findings. Electronically Signed   By: Franki Cabot M.D.   On: 05/13/2021 08:54    Cardiac Studies   Cardiac cath 04/20/21   Mid LM to Dist LM lesion is 70% stenosed. Ost Cx to Prox Cx lesion is 70% stenosed. Mid Cx lesion is 90% stenosed. 3rd Mrg lesion is 100% stenosed. Ost LAD to Prox LAD lesion is 80% stenosed. 1st Diag lesion is 70% stenosed. Ramus lesion is 60% stenosed.   1.   Significant heavily calcified distal left main disease with significant calcified disease affecting the proximal LAD, large first diagonal, ostial ramus and left circumflex.  Occluded OM 3 with collaterals.  The coronary arteries are codominant with no obstructive disease affecting the right coronary artery. 2.  Normal LV systolic function and left ventricular end-diastolic pressure. 3. Afib -RVR during procedure - 5 mg IV metoprolol given    Recommendations: Remains in Afib RVR -- > Due to this and given his significant coronary artery disease, recommend admission to the hospital. Start heparin drip 6 hours after sheath pull. Recommend rate control for atrial fibrillation.  I started oral metoprolol and if rate is still not controlled, diltiazem drip or amiodarone drip can be considered. I requested an echocardiogram. I will consult CT surgery for CABG evaluation.   Diagnostic Dominance: Co-dominant                      ECHO 04/22/21 IMPRESSIONS     1. Left ventricular ejection fraction, by estimation, is 60 to 65%. The  left ventricle has normal function. The left ventricle has no regional  wall motion abnormalities. Left ventricular diastolic parameters are  consistent with Grade II diastolic  dysfunction (pseudonormalization). Elevated left ventricular end-diastolic  pressure. The E/e' is 90.   2. Right ventricular systolic function is mildly reduced. The right  ventricular size is normal. There is mildly elevated pulmonary artery  systolic pressure. The estimated right ventricular systolic pressure is  42.5 mmHg.   3. The mitral valve is grossly normal. Trivial mitral valve  regurgitation.   4. The aortic valve is tricuspid. Aortic valve regurgitation is not  visualized.   5. Aortic dilatation noted. There is mild dilatation of the ascending  aorta, measuring 40 mm.   6. The inferior vena cava is dilated in size with <50% respiratory  variability, suggesting right atrial  pressure of 15 mmHg.   FINDINGS   Left Ventricle: Left ventricular ejection fraction, by estimation, is 60  to 65%. The left ventricle has normal function. The left ventricle has no  regional wall motion abnormalities. Definity contrast agent was given IV  to delineate the left ventricular   endocardial borders. The left ventricular internal cavity size was normal  in size. There is no left ventricular hypertrophy. Left ventricular  diastolic parameters are consistent with Grade II diastolic dysfunction  (  pseudonormalization). Elevated left  ventricular end-diastolic pressure. The E/e' is 49.   Right Ventricle: The right ventricular size is normal. No increase in  right ventricular wall thickness. Right ventricular systolic function is  mildly reduced. There is mildly elevated pulmonary artery systolic  pressure. The tricuspid regurgitant velocity   is 2.55 m/s, and with an assumed right atrial pressure of 15 mmHg, the  estimated right ventricular systolic pressure is 53.6 mmHg.   Left Atrium: Left atrial size was normal in size.   Right Atrium: Right atrial size was normal in size.   Pericardium: There is no evidence of pericardial effusion.   Mitral Valve: The mitral valve is grossly normal. Trivial mitral valve  regurgitation.   Tricuspid Valve: The tricuspid valve is grossly normal. Tricuspid valve  regurgitation is trivial.   Aortic Valve: The aortic valve is tricuspid. Aortic valve regurgitation is  not visualized.   Pulmonic Valve: The pulmonic valve was normal in structure. Pulmonic valve  regurgitation is not visualized.   Aorta: Aortic dilatation noted. There is mild dilatation of the ascending  aorta, measuring 40 mm.   Venous: The inferior vena cava is dilated in size with less than 50%  respiratory variability, suggesting right atrial pressure of 15 mmHg.   IAS/Shunts: No atrial level shunt detected by color flow Doppler.    Patient Profile     80 y.o.  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 Orvan Seen.  Assessment & Plan    CAD s/p CABG x3: with atrial clipping as well. EP wires out yesterday. Treated with ASA, statin, BB. Therapy has been limited with hypotension.   Paroxysmal Afib: developed post operatively but has a hx prior to surgery as well. Rates has been difficult to control. Has been transitioned to oral amiodarone but rates are still elevated.  -- blood pressures limit the titration of BB -- will re-bolus with IV amio 150mg  x1 now. If unable to better control may need to add dig or consider TEE/DCCV - if remains in RVR after adequate Amio loading  -- started on Xarelto   Hypotension: treated with midodrine 10mg  TID, hopefully can scale back as HR improves  HLD: on high dose statin  COPD w/ hx of lung CA: follows with oncology   LE edema: remains + 4.2L, started on torsemide 20mg  daily this morning -- follow I&O and daily weights   For questions or updates, please contact [facility] Please consult [email or web address] for contact info under        Signed, Reino Bellis, NP  05/14/2021, 11:34 AM     ATTENDING ATTESTATION  I have seen, examined and evaluated the patient this AM along with Reino Bellis, NP-C.  After reviewing all the available data and chart, we discussed the patients laboratory, study & physical findings as well as symptoms in detail. I agree with her findings, examination as well as impression recommendations as per our discussion.    Attending adjustments noted in italics.   Otherwise doing well postop from CABG, unfortunately he does have a history of A. fib and now is back in A. fib RVR despite being on high-dose amiodarone by mouth.  Limited  options for rate control.  Was started  on beta-blocker which hopefully his blood pressure will tolerate. Although not a great option, we could consider digoxin loading for short-term benefit, but would not use long-term digoxin.  As such, the best option would be to rebolus with IV amiodarone today.  Reassess, and if he remains in A. fib RVR tomorrow would potentially consider scheduling for TEE cardioversion perhaps Wednesday.  He was started on DOAC last night and we nicely at least 2 doses in prior to cardioversion (either chemical or electrical)  Agree that he needs additional diuresis.  If he does not respond to torsemide, may need another dose of IV Lasix.  Follow renal function.  Appears to be at least 4 L positive still.  Unfortunately with A. fib RVR, his discharge likely delayed until ischemic controlled.  Anticipated being at least another couple days.   Glenetta Hew, M.D., M.S. Interventional Cardiologist   Pager # [phone] Phone # [phone] [address]. [place],  [address]

## 2021-05-15 ENCOUNTER — Ambulatory Visit: Payer: Medicare Other

## 2021-05-15 ENCOUNTER — Inpatient Hospital Stay (HOSPITAL_COMMUNITY): Payer: Medicare Other

## 2021-05-15 DIAGNOSIS — I4891 Unspecified atrial fibrillation: Secondary | ICD-10-CM | POA: Diagnosis not present

## 2021-05-15 DIAGNOSIS — I25110 Atherosclerotic heart disease of native coronary artery with unstable angina pectoris: Secondary | ICD-10-CM | POA: Diagnosis not present

## 2021-05-15 DIAGNOSIS — E8779 Other fluid overload: Secondary | ICD-10-CM | POA: Diagnosis not present

## 2021-05-15 DIAGNOSIS — Z951 Presence of aortocoronary bypass graft: Secondary | ICD-10-CM | POA: Diagnosis not present

## 2021-05-15 DIAGNOSIS — E440 Moderate protein-calorie malnutrition: Secondary | ICD-10-CM | POA: Insufficient documentation

## 2021-05-15 LAB — CBC
HCT: 29.7 % — ABNORMAL LOW (ref 39.0–52.0)
Hemoglobin: 9.5 g/dL — ABNORMAL LOW (ref 13.0–17.0)
MCH: 30.8 pg (ref 26.0–34.0)
MCHC: 32 g/dL (ref 30.0–36.0)
MCV: 96.4 fL (ref 80.0–100.0)
Platelets: 268 10*3/uL (ref 150–400)
RBC: 3.08 MIL/uL — ABNORMAL LOW (ref 4.22–5.81)
RDW: 15.2 % (ref 11.5–15.5)
WBC: 9.4 10*3/uL (ref 4.0–10.5)
nRBC: 0 % (ref 0.0–0.2)

## 2021-05-15 LAB — BASIC METABOLIC PANEL
Anion gap: 6 (ref 5–15)
BUN: 23 mg/dL (ref 8–23)
CO2: 32 mmol/L (ref 22–32)
Calcium: 8.5 mg/dL — ABNORMAL LOW (ref 8.9–10.3)
Chloride: 100 mmol/L (ref 98–111)
Creatinine, Ser: 0.96 mg/dL (ref 0.61–1.24)
GFR, Estimated: >60 mL/min (ref >60)
Glucose, Bld: 125 mg/dL — ABNORMAL HIGH (ref 70–99)
Potassium: 3.8 mmol/L (ref 3.5–5.1)
Sodium: 138 mmol/L (ref 135–145)

## 2021-05-15 LAB — GLUCOSE, CAPILLARY
Glucose-Capillary: 120 mg/dL — ABNORMAL HIGH (ref 70–99)
Glucose-Capillary: 146 mg/dL — ABNORMAL HIGH (ref 70–99)

## 2021-05-15 MED ORDER — DIGOXIN 0.25 MG/ML IJ SOLN
0.2500 mg | Freq: Once | INTRAMUSCULAR | Status: AC
  Administered 2021-05-15: 0.25 mg via INTRAVENOUS
  Filled 2021-05-15: qty 1

## 2021-05-15 MED ORDER — MIDODRINE HCL 5 MG PO TABS
5.0000 mg | ORAL_TABLET | Freq: Three times a day (TID) | ORAL | Status: DC
  Administered 2021-05-15 – 2021-05-16 (×3): 5 mg via ORAL
  Filled 2021-05-15 (×4): qty 1

## 2021-05-15 MED ORDER — METOCLOPRAMIDE HCL 5 MG/ML IJ SOLN
10.0000 mg | Freq: Four times a day (QID) | INTRAMUSCULAR | Status: AC
  Administered 2021-05-15 – 2021-05-17 (×7): 10 mg via INTRAVENOUS
  Filled 2021-05-15 (×7): qty 2

## 2021-05-15 MED ORDER — TORSEMIDE 20 MG PO TABS
40.0000 mg | ORAL_TABLET | Freq: Every day | ORAL | Status: DC
  Administered 2021-05-15 – 2021-05-21 (×7): 40 mg via ORAL
  Filled 2021-05-15 (×8): qty 2

## 2021-05-15 MED ORDER — DIGOXIN 125 MCG PO TABS
0.1250 mg | ORAL_TABLET | Freq: Every day | ORAL | Status: AC
  Administered 2021-05-16 – 2021-05-20 (×5): 0.125 mg via ORAL
  Filled 2021-05-15 (×5): qty 1

## 2021-05-15 MED ORDER — POTASSIUM CHLORIDE CRYS ER 20 MEQ PO TBCR
40.0000 meq | EXTENDED_RELEASE_TABLET | Freq: Once | ORAL | Status: AC
  Administered 2021-05-15: 40 meq via ORAL
  Filled 2021-05-15: qty 2

## 2021-05-15 MED ORDER — AMIODARONE IV BOLUS ONLY 150 MG/100ML
150.0000 mg | Freq: Once | INTRAVENOUS | Status: AC
  Administered 2021-05-15: 150 mg via INTRAVENOUS
  Filled 2021-05-15: qty 100

## 2021-05-15 NOTE — Progress Notes (Addendum)
Progress Note  Patient Name: Robert Bates Date of Encounter: 05/15/2021  [facility] Cardiologist: Mertie Moores, MD   Subjective   Still feeling bad this morning. No appetite, feels weak.   Inpatient Medications    Scheduled Meds:  amiodarone  400 mg Oral BID   aspirin EC  81 mg Oral Daily   Or   aspirin  81 mg Per Tube Daily   atorvastatin  80 mg Oral Daily   Chlorhexidine Gluconate Cloth  6 each Topical Daily   [START ON 05/16/2021] digoxin  0.125 mg Oral Daily   feeding supplement  237 mL Oral TID BM   mouth rinse  15 mL Mouth Rinse BID   metoCLOPramide (REGLAN) injection  10 mg Intravenous Q6H   metoprolol tartrate  12.5 mg Oral BID   midodrine  10 mg Oral TID WC   mometasone-formoterol  2 puff Inhalation BID   multivitamin with minerals  1 tablet Oral Q1500   pantoprazole  40 mg Oral Daily   rivaroxaban  20 mg Oral Q supper   sodium chloride flush  10-40 mL Intracatheter Q12H   sodium chloride flush  3 mL Intravenous Q12H   tamsulosin  0.4 mg Oral q AM   torsemide  40 mg Oral Daily   Continuous Infusions:  lactated ringers     PRN Meds: dextrose, lactated ringers, loperamide, metoprolol tartrate, ondansetron (ZOFRAN) IV, polyethylene glycol, traMADol   Vital Signs    Vitals:   05/15/21 0800 05/15/21 0801 05/15/21 0900 05/15/21 1006  BP: 105/76 105/76  124/73  Pulse: (!) 124 (!) 120 (!) 119 80  Resp: 20 19 20 20   Temp: 97.8 F (36.6 C) 97.8 F (36.6 C)    TempSrc: Oral Oral    SpO2: 95% 95%    Weight:      Height:        Intake/Output Summary (Last 24 hours) at 05/15/2021 1053 Last data filed at 05/15/2021 0906 Gross per 24 hour  Intake 480 ml  Output 1250 ml  Net -770 ml   Last 3 Weights 05/15/2021 05/14/2021 05/13/2021  Weight (lbs) 166 lb 8 oz 169 lb 184 lb 8.4 oz  Weight (kg) 75.524 kg 76.658 kg 83.7 kg      Telemetry    Converted SR yesterday and held until 1am, then back into Afib RVR--> SR around 9am then back into tachy rhythm.  Suspect atrial flutter at present - Personally Reviewed  ECG    No new tracing  Physical Exam   GEN: No acute distress.   Neck: No JVD Cardiac: Tachy, no murmurs, rubs, or gallops.  Respiratory: Clear to auscultation bilaterally. GI: Soft, nontender, non-distended  MS: 1+ LE edema; No deformity. Neuro:  Nonfocal  Psych: Normal affect   Labs    High Sensitivity Troponin:  No results for input(s): TROPONINIHS in the last 720 hours.    Chemistry Recent Labs  Lab 05/12/21 0411 05/13/21 0112 05/15/21 0230  NA 135 136 138  K 4.7 3.9 3.8  CL 100 100 100  CO2 30 29 32  GLUCOSE 102* 142* 125*  BUN 21 24* 23  CREATININE 0.95 0.96 0.96  CALCIUM 7.9* 7.9* 8.5*  GFRNONAA >60 >60 >60  ANIONGAP 5 7 6      Hematology Recent Labs  Lab 05/11/21 0403 05/12/21 0411 05/15/21 0230  WBC 11.8* 8.4 9.4  RBC 2.89* 3.01* 3.08*  HGB 8.9* 9.3* 9.5*  HCT 27.1* 28.2* 29.7*  MCV 93.8 93.7 96.4  MCH 30.8  30.9 30.8  MCHC 32.8 33.0 32.0  RDW 14.6 15.1 15.2  PLT 134* 145* 268    BNPNo results for input(s): BNP, PROBNP in the last 168 hours.   DDimer No results for input(s): DDIMER in the last 168 hours.   Radiology    No results found.  Cardiac Studies   Cardiac cath 04/20/21   Mid LM to Dist LM lesion is 70% stenosed. Ost Cx to Prox Cx lesion is 70% stenosed. Mid Cx lesion is 90% stenosed. 3rd Mrg lesion is 100% stenosed. Ost LAD to Prox LAD lesion is 80% stenosed. 1st Diag lesion is 70% stenosed. Ramus lesion is 60% stenosed.   1.  Significant heavily calcified distal left main disease with significant calcified disease affecting the proximal LAD, large first diagonal, ostial ramus and left circumflex.  Occluded OM 3 with collaterals.  The coronary arteries are codominant with no obstructive disease affecting the right coronary artery. 2.  Normal LV systolic function and left ventricular end-diastolic pressure. 3. Afib -RVR during procedure - 5 mg IV metoprolol given     Recommendations: Remains in Afib RVR -- > Due to this and given his significant coronary artery disease, recommend admission to the hospital. Start heparin drip 6 hours after sheath pull. Recommend rate control for atrial fibrillation.  I started oral metoprolol and if rate is still not controlled, diltiazem drip or amiodarone drip can be considered. I requested an echocardiogram. I will consult CT surgery for CABG evaluation.   Diagnostic Dominance: Co-dominant                      ECHO 04/22/21 IMPRESSIONS     1. Left ventricular ejection fraction, by estimation, is 60 to 65%. The  left ventricle has normal function. The left ventricle has no regional  wall motion abnormalities. Left ventricular diastolic parameters are  consistent with Grade II diastolic  dysfunction (pseudonormalization). Elevated left ventricular end-diastolic  pressure. The E/e' is 53.   2. Right ventricular systolic function is mildly reduced. The right  ventricular size is normal. There is mildly elevated pulmonary artery  systolic pressure. The estimated right ventricular systolic pressure is  51.7 mmHg.   3. The mitral valve is grossly normal. Trivial mitral valve  regurgitation.   4. The aortic valve is tricuspid. Aortic valve regurgitation is not  visualized.   5. Aortic dilatation noted. There is mild dilatation of the ascending  aorta, measuring 40 mm.   6. The inferior vena cava is dilated in size with <50% respiratory  variability, suggesting right atrial pressure of 15 mmHg.   FINDINGS   Left Ventricle: Left ventricular ejection fraction, by estimation, is 60  to 65%. The left ventricle has normal function. The left ventricle has no  regional wall motion abnormalities. Definity contrast agent was given IV  to delineate the left ventricular   endocardial borders. The left ventricular internal cavity size was normal  in size. There is no left ventricular hypertrophy. Left ventricular   diastolic parameters are consistent with Grade II diastolic dysfunction  (pseudonormalization). Elevated left  ventricular end-diastolic pressure. The E/e' is 4.   Right Ventricle: The right ventricular size is normal. No increase in  right ventricular wall thickness. Right ventricular systolic function is  mildly reduced. There is mildly elevated pulmonary artery systolic  pressure. The tricuspid regurgitant velocity   is 2.55 m/s, and with an assumed right atrial pressure of 15 mmHg, the  estimated right ventricular systolic pressure is  41.0 mmHg.   Left Atrium: Left atrial size was normal in size.   Right Atrium: Right atrial size was normal in size.   Pericardium: There is no evidence of pericardial effusion.   Mitral Valve: The mitral valve is grossly normal. Trivial mitral valve  regurgitation.   Tricuspid Valve: The tricuspid valve is grossly normal. Tricuspid valve  regurgitation is trivial.   Aortic Valve: The aortic valve is tricuspid. Aortic valve regurgitation is  not visualized.   Pulmonic Valve: The pulmonic valve was normal in structure. Pulmonic valve  regurgitation is not visualized.   Aorta: Aortic dilatation noted. There is mild dilatation of the ascending  aorta, measuring 40 mm.   Venous: The inferior vena cava is dilated in size with less than 50%  respiratory variability, suggesting right atrial pressure of 15 mmHg.   IAS/Shunts: No atrial level shunt detected by color flow Doppler.  Patient Profile     80 y.o. 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 Orvan Seen.   Assessment & Plan    CAD s/p CABG x3: with atrial clipping as well. EP wires out yesterday. Treated with ASA, statin, BB. Therapy has  been limited with hypotension.   Paroxysmal Afib: developed post operatively but has a hx prior to surgery as well. Rates has been difficult to control. Has been transitioned to oral amiodarone but rates are still elevated. Given IV amio bolus yesterday with conversion to SR until 1am, back into Afib.  -- given IV dig 0.25 with brief conversion to SR but back into suspected atrial flutter currently -- will re-dose with additional 0.25mg  of dig x1 now. Hopefully will be able to hold sinus rhythm. - may need to continue low dose PO Dig -- blood pressures limit the titration of BB -- continue amiodarone 400mg  BID -- will attempt to wean back midodrine to 5mg  TID -- on Xarelto   Hypotension: treated with midodrine 10mg  TID, will plan to wean back midodrine today at 5mg  TID   HLD: on high dose statin   COPD w/ hx of lung CA: follows with oncology   LE edema: remains + 3.6L, started on torsemide 20mg  daily 7/11. Increased to 40mg  today -- follow I&O and daily weights  Abd discomfort/loose stools: Had been on colchicine which was reduce yesterday. Now Dc'ed this morning by primary.  -- suspect amiodarone may be playing a role as well but we are limited in therapy options for afib at this point  Anemia: post op acute blood loss anemia  -- Hgb improved today at 9.5   For questions or updates, please contact [facility] Please consult [email or web address] for contact info under        Signed, Reino Bellis, NP  05/15/2021, 10:53 AM     ATTENDING ATTESTATION  I have seen, examined and evaluated the patient this AM along with Reino Bellis, NP-C.  After reviewing all the available data and chart, we discussed the patients laboratory, study & physical findings as well as symptoms in detail. I agree with her findings, examination as well as impression recommendations as per our discussion.    Attending adjustments noted in italics.   Unfortunately - Afib rates are proving difficult to  control -- he did chemically cardiovert x 2 overnight and today, therefore I do not think that scheduling for TEE/DCCV would be warranted.  Currently telemetry looks like it could be sinus tachycardia, more likely coarse A. fib as it is relatively irregular. We are limited by his borderline blood pressures for rate control.  Currently on amiodarone which could be rebolus.  I simply think that we need more time for amiodarone to kick in, we can use digoxin for short-term but would not want to consider long-term.  He is diuresing well, but needs to work on nutrition-he does not like the food here.  We talked about protein supplements..   I agree with increasing torsemide dose.  Will follow  Glenetta Hew, M.D., M.S. Interventional Cardiologist   Pager # [phone] Phone # [phone] [address] [place], [place] [address]

## 2021-05-15 NOTE — Plan of Care (Signed)
  Problem: Clinical Measurements: Goal: Will remain free from infection Outcome: Progressing   Problem: Clinical Measurements: Goal: Respiratory complications will improve Outcome: Progressing   Problem: Clinical Measurements: Goal: Cardiovascular complication will be avoided Outcome: Progressing   Problem: Health Behavior/Discharge Planning: Goal: Ability to manage health-related needs will improve Outcome: Progressing   Problem: Nutrition: Goal: Adequate nutrition will be maintained Outcome: Progressing   Problem: Safety: Goal: Ability to remain free from injury will improve Outcome: Progressing   Problem: Pain Managment: Goal: General experience of comfort will improve Outcome: Progressing   Problem: Skin Integrity: Goal: Risk for impaired skin integrity will decrease Outcome: Progressing   Problem: Education: Goal: Will demonstrate proper wound care and an understanding of methods to prevent future damage Outcome: Progressing Goal: Knowledge of disease or condition will improve Outcome: Progressing Goal: Knowledge of the prescribed therapeutic regimen will improve Outcome: Progressing Goal: Individualized Educational Video(s) Outcome: Progressing

## 2021-05-15 NOTE — TOC Initial Note (Addendum)
Transition of Care (TOC) - Initial/Assessment Note  Marvetta Gibbons RN, BSN Transitions of Care Unit 4E- RN Case Manager See Treatment Team for direct phone #    Patient Details  Name: Robert Bates MRN: [id number] Date of Birth: 03-Jul-1941  Transition of Care [facility]) CM/SW Contact:    Dawayne Patricia, RN Phone Number: 05/15/2021, 3:55 PM  Clinical Narrative:                 Pt s/p CABG from home w/ wife. Orders placed for HHPT/OT, CM in to speak with pt and wife at the bedside for transition of care needs.  Per pt/wife they have needed DME at home including RW, BSC, shower chair.  Discussed HH needs- pt agreeable to HHPT/OT- list provided for [facility] choice Per CMS guidelines from medicare.gov website with star ratings (copy placed in shadow chart) - per wife she has selected [facility] as first choice with [facility] and [facility] as backups in that order. CM will reach out to these agencies to see if they can accept referral for needed services.   Confirmed home street address as: [address])- [place] [address] Phone #s and PCP all correct in epic including wife's phone #  Call made to [facility] for [facility] referral- msg left- awaiting return call- referral pending 7/13-1415- spoke with Lattie Haw at [facility]- confirmed referral has been accepted.   Expected Discharge Plan: [place] Barriers to Discharge: Continued Medical Work up   Patient Goals and CMS Choice Patient states their goals for this hospitalization and ongoing recovery are:: return home with family CMS Medicare.gov Compare Post Acute Care list provided to:: Patient Choice offered to / list presented to : Patient, Spouse  Expected Discharge Plan and Services Expected Discharge Plan: [place]   Discharge Planning Services: CM Consult Post Acute Care Choice: [place] arrangements for the past 2 months: Mobile Home                 DME  Arranged: N/A DME Agency: NA       HH Arranged: PT, OT   Date HH Agency Contacted: 05/15/21 Time [place]: 0737    Prior Living Arrangements/Services Living arrangements for the past 2 months: Mobile Home Lives with:: Spouse Patient language and need for interpreter reviewed:: Yes Do you feel safe going back to the place where you live?: Yes      Need for Family Participation in Patient Care: Yes (Comment) Care giver support system in place?: Yes (comment) Current home services: DME (RW, BSC, shower chair) Criminal Activity/Legal Involvement Pertinent to Current Situation/Hospitalization: No - Comment as needed  Activities of Daily Living Home Assistive Devices/Equipment: Eyeglasses, Hand-held shower hose ADL Screening (condition at time of admission) Patient's cognitive ability adequate to safely complete daily activities?: Yes Is the patient deaf or have difficulty hearing?: Yes Does the patient have difficulty seeing, even when wearing glasses/contacts?: No Does the patient have difficulty concentrating, remembering, or making decisions?: No Patient able to express need for assistance with ADLs?: Yes Does the patient have difficulty dressing or bathing?: No Independently performs ADLs?: Yes (appropriate for developmental age) Does the patient have difficulty walking or climbing stairs?: No Weakness of Legs: Both Weakness of Arms/Hands: None  Permission Sought/Granted Permission sought to share information with : Facility Art therapist granted to share information with : Yes, Verbal Permission Granted     Permission granted to share info  w AGENCY: HH        Emotional Assessment Appearance:: Appears stated age Attitude/Demeanor/Rapport: Engaged Affect (typically observed): Accepting, Appropriate, Pleasant Orientation: : Oriented to Self, Oriented to Place, Oriented to  Time, Oriented to Situation Alcohol / Substance Use: Not  Applicable Psych Involvement: No (comment)  Admission diagnosis:  Coronary artery disease [I25.10] Patient Active Problem List   Diagnosis Date Noted   Malnutrition of moderate degree 05/15/2021   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   Coronary artery disease 05/08/2021   COVID-19 virus infection 04/23/2021   Unstable angina ([place]) 04/20/2021   Erectile dysfunction 04/11/2021   Head trauma 03/07/2021   Left leg pain 04/07/2020   Benign prostatic hyperplasia with nocturia 03/08/2020   Seizures ([place]) 02/18/2020   PAF (paroxysmal atrial fibrillation) ([place]) 08/15/2017   Diabetes type 2, controlled ([place]) 07/31/2017   Cholecystitis 07/21/2017   COPD GOLD II  04/03/2017   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[place]) 09/06/2015   Lung cancer ([place]) 11/07/2014   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   HTN (hypertension) 04/29/2014   Meningioma ([place]) 10/25/2013   Low back pain 10/25/2013   Osteoarthritis 08/18/2012   Atypical chest pain 08/14/2011   KERATOSIS 10/09/2010   SCIATICA, RIGHT 10/09/2010   UNSPECIFIED HEARING LOSS 05/21/2010   Hyperlipidemia 05/14/2010   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   RENAL CYST, RIGHT 02/05/2010   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[place]) 01/29/2010   LIPOMA 11/17/2009   MICROSCOPIC HEMATURIA 08/11/2008   GERD 07/26/2008   DERMOID CYST 07/25/2008   PCP:  [name], NP Pharmacy:   [facility] [address], [place] [address] Phone: [phone] Fax: [phone]  [facility] [address] [place] [address] Phone: [phone] Fax: [phone]     Social Determinants of Health (SDOH) Interventions    Readmission Risk Interventions Readmission Risk Prevention Plan 05/15/2021  Transportation Screening Complete  PCP or Specialist Appt within 5-7 Days Complete  Home Care Screening Complete  Medication Review (RN CM) Complete  Some recent data might be hidden

## 2021-05-15 NOTE — Progress Notes (Signed)
CARDIAC REHAB PHASE I   PRE:  Rate/Rhythm: 131 ST  BP:  Sitting: 87/70      SaO2: 93 1L  MODE:  Ambulation: 470 ft   POST:  Rate/Rhythm: 134 ST  BP:  Sitting: 112/81      SaO2: 91 1L  Pt given sternal precaution cues when sitting up and standing up. Pt tolerated exercise well and amb 470 ft w/ front wheel walker and contact guard. Pt weaned off of O2 for walk. Pt felt SOB after the walk. O2 sat 89 after walk. Put back on 1L, O2 sat 91 1L. Pt to bed after walk w/ call bell in reach and family in room. Will continue to follow.  [place], MS, ACM-CEP 05/15/2021 3:11 PM

## 2021-05-15 NOTE — Progress Notes (Signed)
CARDIAC REHAB PHASE I   Pt seen walking with PT. Will f/u later to continue to encourage ambulation.  Rufina Falco, RN BSN 05/15/2021 9:44 AM

## 2021-05-15 NOTE — Progress Notes (Signed)
Mobility Specialist: Progress Note   05/15/21 1215  Mobility  Activity Ambulated in hall  Level of Assistance Contact guard assist, steadying assist  Assistive Device Front wheel walker  Distance Ambulated (ft) 470 ft  Mobility Ambulated with assistance in hallway  Mobility Response Tolerated well  Mobility performed by Mobility specialist  $Mobility charge 1 Mobility   Pre-Mobility: 127 HR Post-Mobility: 128 HR, 115/87 BP  Pt was able to independently sit EOB from supine and was contact guard to stand. Pt ambulated on 2 L/min [place] but was weaned down to 1 L/min [place], pt asx throughout. I was unable to get SpO2 reading at this time. Pt back in bed with call bell at his side.   [facility] Makalya Nave Mobility Specialist Mobility Specialist Phone: [phone]

## 2021-05-15 NOTE — Progress Notes (Signed)
      [address]       [place] [address]             [phone]        7 Days Post-Op Procedure(s) (LRB): CORONARY ARTERY BYPASS GRAFTING (CABG)X 3 USING LEFT INTERNAL MAMMARY ARTERY AND RIGHT GREATER SAPEHNOUS VEIN (N/A) INDOCYANINE GREEN FLUORESCENCE IMAGING (ICG) (N/A) TRANSESOPHAGEAL ECHOCARDIOGRAM (TEE) (N/A) CLIPPING OF ATRIAL APPENDAGE USING 45 ATRICLIP (Left) APPLICATION OF CELL SAVER (N/A) ENDOVEIN HARVEST OF GREATER SAPHENOUS VEIN (Right)  Subjective: Patient sleeping and awakened. He states this am he has "nausea and food is terrible". He denies abdominal pain  Objective: Vital signs in last 24 hours: Temp:  [97.4 F (36.3 C)-98.8 F (37.1 C)] 98 F (36.7 C) (07/12 0358) Pulse Rate:  [71-134] 71 (07/11 2037) Cardiac Rhythm: Normal sinus rhythm (07/12 0330) Resp:  [16-23] 20 (07/12 0400) BP: (94-118)/(49-92) 110/71 (07/12 0400) SpO2:  [90 %-100 %] 96 % (07/12 0358) Weight:  [75.5 kg] 75.5 kg (07/12 0410)  Pre op weight  72.3 kg Current Weight  05/15/21 75.5 kg      Intake/Output from previous day: 07/11 0701 - 07/12 0700 In: 300 [P.O.:300] Out: 1250 [Urine:1250]   Physical Exam:  Cardiovascular: IRRR IRRR Pulmonary: Diminished bibasilar breath sounds L>R Abdomen: Soft, non tender, bowel sounds present. Extremities: ++ bilateral lower extremity edema. Ecchymosis right thigh Wounds: Sternal wound is clean and dry.  No erythema or signs of infection. RLE wound  clean and dry  Lab Results: CBC: Recent Labs    05/15/21 0230  WBC 9.4  HGB 9.5*  HCT 29.7*  PLT 268    BMET:  Recent Labs    05/13/21 0112 05/15/21 0230  NA 136 138  K 3.9 3.8  CL 100 100  CO2 29 32  GLUCOSE 142* 125*  BUN 24* 23  CREATININE 0.96 0.96  CALCIUM 7.9* 8.5*     PT/INR:  Lab Results  Component Value Date   INR 1.4 (H) 05/08/2021   INR 1.1 05/04/2021   INR 1.01 09/30/2017   ABG:  INR: Will add last result for INR, ABG once  components are confirmed Will add last 4 CBG results once components are confirmed  Assessment/Plan:  1. CV - Had LA clip at surgery. PAF. On Amiodarone 400 mg bid and Midodrine 10 mg tid, and Rivaroxaban this evening. Will start Digoxin. Cardiology following. May need TEE/DCCV 2.  Pulmonary - History of COPD. Continue Dulera. On 2 liters of oxygen via [place]. Wean as able. Encourage incentive spirometer 3. Volume Overload - Will increaseTorsemide this am 4.  Expected post op acute blood loss anemia - H and H this am stable at 9.5 and 29.7 6. Mild thrombocytopenia resolved -platelets this am up to 268,000 7. Deconditioned-PT/OT 8. GI-Imodium PRN loose stools. Regarding nausea, stop Oxy PRN. On Amiodarone also but with a fib not well controlled, will not decrease yet. Check KUB. Monitor. Appreciate nutrition's assistance. 9. Supplement potassium  Robert Wamble M ZimmermanPA-C 05/15/2021,7:02 AM

## 2021-05-15 NOTE — Evaluation (Signed)
Physical Therapy Evaluation Patient Details Name: Robert Bates MRN: [id number] DOB: 14-Nov-1940 Today's Date: 05/15/2021   History of Present Illness  Robert Bates is a 80 y.o. male admitted for CABGx3, TEE, and clipping of atrial appendage on 7/5 due to CAD. PMH: hx of PAF, tobacco use with COPD and history of lung CA, peripheral vascular disease, HLD, chronic diastolic CHF and DM2  Clinical Impression  Patient received in bed, he is agreeable to PT assessment. Patient requires cues for rolling in bed and assist to push up to sitting. Patient requires cues for sternal precautions with sit to stand and min guard. He is able to ambulate 400 feet with rolling walker and min guard. Patient will continue to benefit from skilled PT while here to improve strength and safety with mobility.       Follow Up Recommendations Home health PT    Equipment Recommendations  None recommended by PT    Recommendations for Other Services       Precautions / Restrictions Precautions Precautions: Sternal;Fall Precaution Booklet Issued: Yes (comment) Restrictions Weight Bearing Restrictions: No      Mobility  Bed Mobility Overal bed mobility: Needs Assistance Bed Mobility: Rolling;Sidelying to Sit Rolling: Min guard Sidelying to sit: Min guard       General bed mobility comments: cues to log roll and use pillow to brace    Transfers Overall transfer level: Needs assistance Equipment used: Rolling walker (2 wheeled) Transfers: Sit to/from Stand Sit to Stand: Min guard         General transfer comment: patient requires min guard for safety  Ambulation/Gait Ambulation/Gait assistance: Min guard Gait Distance (Feet): 400 Feet Assistive device: Rolling walker (2 wheeled) Gait Pattern/deviations: Step-through pattern;Drifts right/left Gait velocity: decr   General Gait Details: patient with good tolerance, required on standing rest break, talking, joking throughout walk. Slight  unsteadiness, distraction  Stairs            Wheelchair Mobility    Modified Rankin (Stroke Patients Only)       Balance Overall balance assessment: Needs assistance Sitting-balance support: Feet supported Sitting balance-Leahy Scale: Good     Standing balance support: Bilateral upper extremity supported;During functional activity Standing balance-Leahy Scale: Fair Standing balance comment: able to stand statically at sink                             Pertinent Vitals/Pain Pain Assessment: No/denies pain    Home Living Family/patient expects to be discharged to:: Private residence Living Arrangements: Spouse/significant other Available Help at Discharge: Family;Available 24 hours/day Type of Home: Mobile home Home Access: Stairs to enter Entrance Stairs-Rails: Right;Left;Can reach both Entrance Stairs-Number of Steps: 6 Home Layout: One level Home Equipment: Walker - 2 wheels;Shower seat      Prior Function Level of Independence: Independent         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 Plays guitar     Hand Dominance   Dominant Hand: Right    Extremity/Trunk Assessment   Upper Extremity Assessment Upper Extremity Assessment: Defer to OT evaluation    Lower Extremity Assessment Lower Extremity Assessment: Generalized weakness    Cervical / Trunk Assessment Cervical / Trunk Assessment: Normal  Communication   Communication: No difficulties  Cognition Arousal/Alertness: Awake/alert Behavior During Therapy: WFL for tasks assessed/performed Overall Cognitive Status: No family/caregiver present to determine baseline cognitive functioning Area of Impairment: Memory  Memory: Decreased recall of precautions         General Comments: cues needed to recall sternal precautions during activities      General Comments General comments (skin integrity, edema,  etc.): BP WFL, HR 70s-80s during activity. Pt denies wearing O2 at home, removed for bathing task with SpO2 85% on RA. Replaced with 2 L O2 and recovered to 92% with seated rest break. Endorsed SOB/fatigue with bathing without O2 on    Exercises     Assessment/Plan    PT Assessment Patient needs continued PT services  PT Problem List Decreased mobility;Decreased strength;Decreased activity tolerance;Decreased balance;Decreased knowledge of precautions;Decreased safety awareness       PT Treatment Interventions Therapeutic exercise;Gait training;Stair training;Functional mobility training;Therapeutic activities;Patient/family education;Balance training    PT Goals (Current goals can be found in the Care Plan section)  Acute Rehab PT Goals Patient Stated Goal: to return home, get stronger PT Goal Formulation: With patient Time For Goal Achievement: 05/22/21 Potential to Achieve Goals: Good    Frequency Min 2X/week   Barriers to discharge Inaccessible home environment      Co-evaluation               AM-PAC PT "6 Clicks" Mobility  Outcome Measure Help needed turning from your back to your side while in a flat bed without using bedrails?: None Help needed moving from lying on your back to sitting on the side of a flat bed without using bedrails?: A Little Help needed moving to and from a bed to a chair (including a wheelchair)?: A Little Help needed standing up from a chair using your arms (e.g., wheelchair or bedside chair)?: A Little Help needed to walk in hospital room?: A Little Help needed climbing 3-5 steps with a railing? : A Little 6 Click Score: 19    End of Session Equipment Utilized During Treatment: Gait belt Activity Tolerance: Patient tolerated treatment well Patient left: in chair;Other (comment) (left with OT sitting at sink) Nurse Communication: Mobility status PT Visit Diagnosis: Muscle weakness (generalized) (M62.81)    Time: 1610-9604 PT Time  Calculation (min) (ACUTE ONLY): 23 min   Charges:   PT Evaluation $PT Eval Moderate Complexity: 1 Mod PT Treatments $Gait Training: 8-22 mins        [employer], PT, [facility] 05/15/21,11:08 AM

## 2021-05-15 NOTE — Evaluation (Signed)
Occupational Therapy Evaluation Patient Details Name: Robert Bates MRN: [id number] DOB: [birth year] Today's Date: 05/15/2021    History of Present Illness Derreck Wiltsey is a 80 y.o. male admitted for CABGx3, TEE, and clipping of atrial appendage on 7/5 due to CAD. PMH: hx of PAF, tobacco use with COPD and history of lung CA, peripheral vascular disease, HLD, chronic diastolic CHF and DM2   Clinical Impression   PTA, pt lives with spouse and reports Independence with all daily tasks without use of AD. Pt presents now with deficits in cardiopulmonary tolerance, standing balance, and endurance. Provided education (& handout) on sternal precautions during daily tasks with pt requiring Min A for UB/LB ADLs at this time. Pt overall min guard for transfers/short mobility using RW. Pt did endorse fatigue during bathing tasks this AM with continued reinforcement of energy conservation strategies needed. Recommend HHOT follow-up to maximize safety/independence with ADLs at home.   SpO2 85% on RA. Requires 2 L O2 to maintain >92% HR 70s-80s with ADLs, BP WFL.     Follow Up Recommendations  Home health OT;Supervision - Intermittent    Equipment Recommendations  None recommended by OT    Recommendations for Other Services       Precautions / Restrictions Precautions Precautions: Fall;Sternal Precaution Booklet Issued: Yes (comment) Restrictions Weight Bearing Restrictions: No      Mobility Bed Mobility               General bed mobility comments: received in chair at sink after PT session    Transfers Overall transfer level: Needs assistance Equipment used: None Transfers: Sit to/from Stand Sit to Stand: Min guard         General transfer comment: min guard for sit to stands at sink without AD, cues to place hands on lap    Balance Overall balance assessment: Needs assistance Sitting-balance support: No upper extremity supported Sitting balance-Leahy Scale: Good      Standing balance support: Bilateral upper extremity supported;Single extremity supported;During functional activity Standing balance-Leahy Scale: Fair Standing balance comment: able to stand statically at sink                           ADL either performed or assessed with clinical judgement   ADL Overall ADL's : Needs assistance/impaired Eating/Feeding: Independent;Sitting   Grooming: Set up;Sitting;Oral care;Wash/dry face;Brushing hair Grooming Details (indicate cue type and reason): washed face, hair via shampoo cap and rinsed mouth seated at sink Upper Body Bathing: Minimal assistance;Sitting Upper Body Bathing Details (indicate cue type and reason): to bathe back while seated Lower Body Bathing: Minimal assistance;Sit to/from stand Lower Body Bathing Details (indicate cue type and reason): Min A for thoroughness of peri care due to fatigue         Toilet Transfer: Min guard;Ambulation;RW             General ADL Comments: Guided pt in bathing tasks seated at sink, educated on sternal precautions with reinforcement needed.     Vision Patient Visual Report: No change from baseline Vision Assessment?: No apparent visual deficits     Perception     Praxis      Pertinent Vitals/Pain Pain Assessment: No/denies pain     Hand Dominance Right   Extremity/Trunk Assessment Upper Extremity Assessment Upper Extremity Assessment: Overall WFL for tasks assessed   Lower Extremity Assessment Lower Extremity Assessment: Defer to PT evaluation   Cervical / Trunk Assessment Cervical / Trunk Assessment: Normal  Communication Communication Communication: No difficulties   Cognition Arousal/Alertness: Awake/alert Behavior During Therapy: WFL for tasks assessed/performed Overall Cognitive Status: No family/caregiver present to determine baseline cognitive functioning Area of Impairment: Memory                     Memory: Decreased recall of  precautions         General Comments: cues needed to recall sternal precautions during activities   General Comments  BP WFL, HR 70s-80s during activity. Pt denies wearing O2 at home, removed for bathing task with SpO2 85% on RA. Replaced with 2 L O2 and recovered to 92% with seated rest break. Endorsed SOB/fatigue with bathing without O2 on    Exercises     Shoulder Instructions      Home Living Family/patient expects to be discharged to:: Private residence Living Arrangements: Spouse/significant other Available Help at Discharge: Family;Available 24 hours/day Type of Home: Mobile home Home Access: Stairs to enter Entrance Stairs-Number of Steps: 6 Entrance Stairs-Rails: Right;Left Home Layout: One level     Bathroom Shower/Tub: Teacher, early years/pre: Standard     Home Equipment: Environmental consultant - 2 wheels;Shower seat          Prior Functioning/Environment Level of Independence: Independent        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 Plays guitar        OT Problem List: Decreased activity tolerance;Impaired balance (sitting and/or standing);Decreased knowledge of precautions;Decreased knowledge of use of DME or AE;Cardiopulmonary status limiting activity      OT Treatment/Interventions: Self-care/ADL training;Therapeutic exercise;Energy conservation;DME and/or AE instruction;Therapeutic activities;Patient/family education;Balance training    OT Goals(Current goals can be found in the care plan section) Acute Rehab OT Goals Patient Stated Goal: be able to go home and get a shower OT Goal Formulation: With patient Time For Goal Achievement: 05/29/21 Potential to Achieve Goals: Good  OT Frequency: Min 2X/week   Barriers to D/C:            Co-evaluation              AM-PAC OT "6 Clicks" Daily Activity     Outcome Measure Help from another person eating meals?: None Help from another person taking  care of personal grooming?: A Little Help from another person toileting, which includes using toliet, bedpan, or urinal?: A Little Help from another person bathing (including washing, rinsing, drying)?: A Little Help from another person to put on and taking off regular upper body clothing?: A Little Help from another person to put on and taking off regular lower body clothing?: A Little 6 Click Score: 19   End of Session Equipment Utilized During Treatment: Oxygen Nurse Communication: Mobility status;Other (comment) (O2)  Activity Tolerance: Patient tolerated treatment well Patient left: Other (comment) (seated at sink with NT)  OT Visit Diagnosis: Other abnormalities of gait and mobility (R26.89);Unsteadiness on feet (R26.81)                Time: 2563-8937 OT Time Calculation (min): 25 min Charges:  OT General Charges $OT Visit: 1 Visit OT Evaluation $OT Eval Moderate Complexity: 1 Mod OT Treatments $Self Care/Home Management : 8-22 mins  Malachy Chamber, OTR/L Acute Rehab Services Office: [phone]   Aleph Nickson 05/15/2021, 10:29 AM

## 2021-05-16 DIAGNOSIS — Z951 Presence of aortocoronary bypass graft: Secondary | ICD-10-CM | POA: Diagnosis not present

## 2021-05-16 DIAGNOSIS — E8779 Other fluid overload: Secondary | ICD-10-CM | POA: Diagnosis not present

## 2021-05-16 DIAGNOSIS — I25110 Atherosclerotic heart disease of native coronary artery with unstable angina pectoris: Secondary | ICD-10-CM | POA: Diagnosis not present

## 2021-05-16 DIAGNOSIS — I4891 Unspecified atrial fibrillation: Secondary | ICD-10-CM | POA: Diagnosis not present

## 2021-05-16 LAB — GLUCOSE, CAPILLARY: Glucose-Capillary: 153 mg/dL — ABNORMAL HIGH (ref 70–99)

## 2021-05-16 MED ORDER — POTASSIUM CHLORIDE CRYS ER 20 MEQ PO TBCR
20.0000 meq | EXTENDED_RELEASE_TABLET | Freq: Every day | ORAL | Status: DC
  Administered 2021-05-16: 20 meq via ORAL
  Filled 2021-05-16: qty 1

## 2021-05-16 MED ORDER — METOPROLOL TARTRATE 12.5 MG HALF TABLET
12.5000 mg | ORAL_TABLET | Freq: Once | ORAL | Status: AC
  Administered 2021-05-16: 12.5 mg via ORAL
  Filled 2021-05-16: qty 1

## 2021-05-16 MED ORDER — ACETAMINOPHEN 500 MG PO TABS: 500.0000 mg | ORAL_TABLET | Freq: Four times a day (QID) | ORAL | 0 refills | Status: AC | PRN

## 2021-05-16 MED ORDER — METOPROLOL TARTRATE 25 MG PO TABS: 25.0000 mg | ORAL_TABLET | Freq: Two times a day (BID) | ORAL | Status: DC

## 2021-05-16 NOTE — Progress Notes (Signed)
Mobility Specialist: Progress Note   05/16/21 1353  Mobility  Activity Ambulated in hall  Level of Assistance Standby assist, set-up cues, supervision of patient - no hands on  Assistive Device Front wheel walker  Distance Ambulated (ft) 470 ft  Mobility Ambulated with assistance in hallway  Mobility Response Tolerated well  Mobility performed by Mobility specialist  $Mobility charge 1 Mobility   Pre-Mobility: 117 HR, 94% SpO2 Post-Mobility: 119 HR, 112/62 BP, 97% SpO2  Pt c/o feeling slightly SOB during ambulation as well as pain in his RLE and neck, no rating given. Pt back to bed after walk per request with call bell and phone at his side.  [facility] Bosco Paparella Mobility Specialist Mobility Specialist Phone: [phone]

## 2021-05-16 NOTE — Progress Notes (Addendum)
Patient ambulated in hallway 940 feet with rolling walker. Patient tolerated well. Gait steady. Back to chair after walk call bell with in reach. Wilbert Schouten, Bettina Gavia RN

## 2021-05-16 NOTE — Progress Notes (Signed)
70 Pt walked with RN this morning and Mobility specialist a few minutes ago. Will continue to follow. Graylon Good RN BSN 05/16/2021 2:14 PM

## 2021-05-16 NOTE — Progress Notes (Addendum)
Progress Note  Patient Name: Robert Bates Date of Encounter: 05/16/2021  [facility] Cardiologist: Mertie Moores, MD   Subjective   Feeling much better today. Was able to eat breakfast.   Inpatient Medications    Scheduled Meds:  amiodarone  400 mg Oral BID   aspirin EC  81 mg Oral Daily   Or   aspirin  81 mg Per Tube Daily   atorvastatin  80 mg Oral Daily   Chlorhexidine Gluconate Cloth  6 each Topical Daily   digoxin  0.125 mg Oral Daily   feeding supplement  237 mL Oral TID BM   mouth rinse  15 mL Mouth Rinse BID   metoCLOPramide (REGLAN) injection  10 mg Intravenous Q6H   metoprolol tartrate  12.5 mg Oral BID   midodrine  5 mg Oral TID WC   mometasone-formoterol  2 puff Inhalation BID   multivitamin with minerals  1 tablet Oral Q1500   pantoprazole  40 mg Oral Daily   potassium chloride  20 mEq Oral Daily   rivaroxaban  20 mg Oral Q supper   sodium chloride flush  10-40 mL Intracatheter Q12H   sodium chloride flush  3 mL Intravenous Q12H   tamsulosin  0.4 mg Oral q AM   torsemide  40 mg Oral Daily   Continuous Infusions:  lactated ringers     PRN Meds: dextrose, lactated ringers, loperamide, metoprolol tartrate, ondansetron (ZOFRAN) IV, polyethylene glycol, traMADol   Vital Signs    Vitals:   05/16/21 0803 05/16/21 0811 05/16/21 0824 05/16/21 1041  BP: 119/85   127/86  Pulse: 99  (!) 126 100  Resp: 20   18  Temp: 97.8 F (36.6 C)   98.5 F (36.9 C)  TempSrc: Oral   Oral  SpO2: 98% 95%  96%  Weight:      Height:        Intake/Output Summary (Last 24 hours) at 05/16/2021 1203 Last data filed at 05/16/2021 1021 Gross per 24 hour  Intake 740 ml  Output 3150 ml  Net -2410 ml   Last 3 Weights 05/16/2021 05/15/2021 05/14/2021  Weight (lbs) 163 lb 12.8 oz 166 lb 8 oz 169 lb  Weight (kg) 74.299 kg 75.524 kg 76.658 kg      Telemetry    Afib RVR-->SR--> Afib RVR - Personally Reviewed  ECG    No new tracing  Physical Exam   GEN: No acute  distress.   Neck: No JVD Cardiac: Irreg Irreg, no murmurs, rubs, or gallops.  Respiratory: Diminished in bases GI: Soft, nontender, non-distended  MS: Bilateral 1+ LE edema; No deformity. Neuro:  Nonfocal  Psych: Normal affect   Labs    High Sensitivity Troponin:  No results for input(s): TROPONINIHS in the last 720 hours.    Chemistry Recent Labs  Lab 05/12/21 0411 05/13/21 0112 05/15/21 0230  NA 135 136 138  K 4.7 3.9 3.8  CL 100 100 100  CO2 30 29 32  GLUCOSE 102* 142* 125*  BUN 21 24* 23  CREATININE 0.95 0.96 0.96  CALCIUM 7.9* 7.9* 8.5*  GFRNONAA >60 >60 >60  ANIONGAP 5 7 6      Hematology Recent Labs  Lab 05/11/21 0403 05/12/21 0411 05/15/21 0230  WBC 11.8* 8.4 9.4  RBC 2.89* 3.01* 3.08*  HGB 8.9* 9.3* 9.5*  HCT 27.1* 28.2* 29.7*  MCV 93.8 93.7 96.4  MCH 30.8 30.9 30.8  MCHC 32.8 33.0 32.0  RDW 14.6 15.1 15.2  PLT 134* 145*  268    BNPNo results for input(s): BNP, PROBNP in the last 168 hours.   DDimer No results for input(s): DDIMER in the last 168 hours.   Radiology    DG Abd 1 View  Result Date: 05/15/2021 CLINICAL DATA:  Nausea EXAM: ABDOMEN - 1 VIEW COMPARISON:  CT 07/11/2017 FINDINGS: Bowel gas pattern is within normal limits. No evidence of ileus or obstruction. Spiculated bladder calculus remains visible to the left of midline, similar to the CT study of September 2018. IMPRESSION: Bowel gas pattern within normal limits. Bladder calculus, as seen in 2018. Electronically Signed   By: Nelson Chimes M.D.   On: 05/15/2021 11:29    Cardiac Studies   Cardiac cath 04/20/21   Mid LM to Dist LM lesion is 70% stenosed. Ost Cx to Prox Cx lesion is 70% stenosed. Mid Cx lesion is 90% stenosed. 3rd Mrg lesion is 100% stenosed. Ost LAD to Prox LAD lesion is 80% stenosed. 1st Diag lesion is 70% stenosed. Ramus lesion is 60% stenosed.   1.  Significant heavily calcified distal left main disease with significant calcified disease affecting the proximal  LAD, large first diagonal, ostial ramus and left circumflex.  Occluded OM 3 with collaterals.  The coronary arteries are codominant with no obstructive disease affecting the right coronary artery. 2.  Normal LV systolic function and left ventricular end-diastolic pressure. 3. Afib -RVR during procedure - 5 mg IV metoprolol given    Recommendations: Remains in Afib RVR -- > Due to this and given his significant coronary artery disease, recommend admission to the hospital. Start heparin drip 6 hours after sheath pull. Recommend rate control for atrial fibrillation.  I started oral metoprolol and if rate is still not controlled, diltiazem drip or amiodarone drip can be considered. I requested an echocardiogram. I will consult CT surgery for CABG evaluation.   Diagnostic Dominance: Co-dominant                      ECHO 04/22/21 IMPRESSIONS     1. Left ventricular ejection fraction, by estimation, is 60 to 65%. The  left ventricle has normal function. The left ventricle has no regional  wall motion abnormalities. Left ventricular diastolic parameters are  consistent with Grade II diastolic  dysfunction (pseudonormalization). Elevated left ventricular end-diastolic  pressure. The E/e' is 45.   2. Right ventricular systolic function is mildly reduced. The right  ventricular size is normal. There is mildly elevated pulmonary artery  systolic pressure. The estimated right ventricular systolic pressure is  87.8 mmHg.   3. The mitral valve is grossly normal. Trivial mitral valve  regurgitation.   4. The aortic valve is tricuspid. Aortic valve regurgitation is not  visualized.   5. Aortic dilatation noted. There is mild dilatation of the ascending  aorta, measuring 40 mm.   6. The inferior vena cava is dilated in size with <50% respiratory  variability, suggesting right atrial pressure of 15 mmHg.   FINDINGS   Left Ventricle: Left ventricular ejection fraction, by estimation, is 60  to  65%. The left ventricle has normal function. The left ventricle has no  regional wall motion abnormalities. Definity contrast agent was given IV  to delineate the left ventricular   endocardial borders. The left ventricular internal cavity size was normal  in size. There is no left ventricular hypertrophy. Left ventricular  diastolic parameters are consistent with Grade II diastolic dysfunction  (pseudonormalization). Elevated left  ventricular end-diastolic pressure. The E/e' is  19.   Right Ventricle: The right ventricular size is normal. No increase in  right ventricular wall thickness. Right ventricular systolic function is  mildly reduced. There is mildly elevated pulmonary artery systolic  pressure. The tricuspid regurgitant velocity   is 2.55 m/s, and with an assumed right atrial pressure of 15 mmHg, the  estimated right ventricular systolic pressure is 49.7 mmHg.   Left Atrium: Left atrial size was normal in size.   Right Atrium: Right atrial size was normal in size.   Pericardium: There is no evidence of pericardial effusion.   Mitral Valve: The mitral valve is grossly normal. Trivial mitral valve  regurgitation.   Tricuspid Valve: The tricuspid valve is grossly normal. Tricuspid valve  regurgitation is trivial.   Aortic Valve: The aortic valve is tricuspid. Aortic valve regurgitation is  not visualized.   Pulmonic Valve: The pulmonic valve was normal in structure. Pulmonic valve  regurgitation is not visualized.   Aorta: Aortic dilatation noted. There is mild dilatation of the ascending  aorta, measuring 40 mm.   Venous: The inferior vena cava is dilated in size with less than 50%  respiratory variability, suggesting right atrial pressure of 15 mmHg.   IAS/Shunts: No atrial level shunt detected by color flow Doppler.  Patient Profile     80 y.o. 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 Orvan Seen.  Assessment & Plan    CAD s/p CABG x3: with atrial clipping as well. EP wires out yesterday. Treated with ASA, statin, BB. Therapy has been limited with hypotension.   Paroxysmal Afib: developed post operatively but has a hx prior to surgery as well. Rates has been difficult to control. Has been transitioned to oral amiodarone but rates are still elevated.  -- Given IV amio bolus x2 with conversion to SR , but bounced back into afib. Received IV dig 0.25 x2 yesterday. -- blood pressures have been low but improved today, will attempt to increase metoprolol to 25mg  BID -- continue amiodarone 400mg  BID, and dig 0.125mg  daily, along with Xarelto -- will DC midodrine today   Hypotension: treated with midodrine 10mg  TID, cut back to 5mg  TID yesterday with stable BP -- will stop today   HLD: on high dose statin   COPD w/ hx of lung CA: follows with oncology   LE edema: torsemide increased to 40mg  daily yesterday  3.2L UOP, weight down 166.5>>163.8lbs -- follow I&O and daily weights   Abd discomfort/loose stools: Had been on colchicine, stopped yesterday. Significant improvement.   Anemia: post op acute blood loss anemia -- Hgb improved today at 9.5   For questions or updates, please contact [place] Please consult [email or web address] for contact info under        Signed, Reino Bellis, NP  05/16/2021, 12:03 PM     ATTENDING ATTESTATION  I have seen, examined and evaluated the patient this AM along with Reino Bellis, NP-C.  After reviewing all the available data and chart, we discussed the patients laboratory, study & physical findings as well as symptoms in detail. I agree with her findings, examination as well as impression recommendations as per our discussion.    Attending adjustments  noted in italics.  He looks and feels a whole lot better today.  Unfortunately, he still has tachycardia with heart rates in the 105 120 beats minute range.  He was well controlled overnight as she went into sinus rhythm but back in A. fib again.  He did diurese a lot more and is breathing better.  Low bit of wheezing in the bases-he now has inhalers.  At this point I think we just simply need to allow the amiodarone and digoxin to kick in and be fearful of doing anything more aggressive because it would make him potentially more bradycardic when he finally breaks.  He is no longer requiring midodrine which may allow [place] to potentially titrate up his beta-blocker. I think if we can get his rate down to an average of 90s or below, he could probably ready for discharge from a cardiac standpoint.  Since he is going in and out of A. fib, I do not think cardioversion makes sense.  We will now have him on treatment dose DOAC.   After 7 days of amiodarone 400 mg twice daily, will reduce to 200mg  twice daily.  We will continue to follow.  He is finally starting to eat more and feels stronger.  Still has some leg edema.  Weight converted now to torsemide 40 mg.   Glenetta Hew, M.D., M.S. Interventional Cardiologist   Pager # [phone] Phone # [phone] [address] [place], [place] [address]

## 2021-05-16 NOTE — Progress Notes (Addendum)
      [address]       [place] [address]             [phone]        8 Days Post-Op Procedure(s) (LRB): CORONARY ARTERY BYPASS GRAFTING (CABG)X 3 USING LEFT INTERNAL MAMMARY ARTERY AND RIGHT GREATER SAPEHNOUS VEIN (N/A) INDOCYANINE GREEN FLUORESCENCE IMAGING (ICG) (N/A) TRANSESOPHAGEAL ECHOCARDIOGRAM (TEE) (N/A) CLIPPING OF ATRIAL APPENDAGE USING 45 ATRICLIP (Left) APPLICATION OF CELL SAVER (N/A) ENDOVEIN HARVEST OF GREATER SAPHENOUS VEIN (Right)  Subjective: Patent sitting up on edge of bed speaking with wife. He is feeling better this am. He denies nausea, or loose stools.  Objective: Vital signs in last 24 hours: Temp:  [97.6 F (36.4 C)-98.8 F (37.1 C)] 98.5 F (36.9 C) (07/13 0353) Pulse Rate:  [59-124] 59 (07/13 0458) Cardiac Rhythm: Atrial fibrillation (07/12 1901) Resp:  [15-23] 23 (07/13 0458) BP: (94-124)/(57-80) 110/73 (07/13 0353) SpO2:  [94 %-98 %] 94 % (07/13 0458) Weight:  [74.3 kg] 74.3 kg (07/13 0458)  Pre op weight  72.3 kg Current Weight  05/16/21 74.3 kg      Intake/Output from previous day: 07/12 0701 - 07/13 0700 In: 21 [P.O.:780] Out: 3275 [Urine:3275]   Physical Exam:  Cardiovascular: IRRR IRRR Pulmonary: Slightly diminished bibasilar breath sounds L>R Abdomen: Soft, non tender, bowel sounds present. Extremities: ++ bilateral lower extremity edema. Ecchymosis right thigh Wounds: Sternal wound is clean and dry.  No erythema or signs of infection. RLE wound  clean and dry  Lab Results: CBC: Recent Labs    05/15/21 0230  WBC 9.4  HGB 9.5*  HCT 29.7*  PLT 268    BMET:  Recent Labs    05/15/21 0230  NA 138  K 3.8  CL 100  CO2 32  GLUCOSE 125*  BUN 23  CREATININE 0.96  CALCIUM 8.5*     PT/INR:  Lab Results  Component Value Date   INR 1.4 (H) 05/08/2021   INR 1.1 05/04/2021   INR 1.01 09/30/2017   ABG:  INR: Will add last result for INR, ABG once components are confirmed Will add last 4  CBG results once components are confirmed  Assessment/Plan:  1. CV - Had LA clip at surgery. PAF. On Amiodarone 400 mg bid and Midodrine 10 mg tid, Digoxin, and Rivaroxaban.Will not use Digoxin long term as not recommended by cardiology. Appreciate cardiology assistance. Will ask patient to be seen in a fib clinic. 2.  Pulmonary - History of COPD. Continue Dulera. On 2 liters of oxygen via [place]. Wean as able. Encourage incentive spirometer 3. Volume Overload - ContinueTorsemide  4.  Expected post op acute blood loss anemia - Last H and H stable at 9.5 and 29.7 5. Deconditioned-PT/OT 6. Continue to encourage oral intake and supplements  Estil Vallee M ZimmermanPA-C 05/16/2021,7:02 AM

## 2021-05-17 ENCOUNTER — Inpatient Hospital Stay (HOSPITAL_COMMUNITY): Payer: Medicare Other

## 2021-05-17 DIAGNOSIS — I4891 Unspecified atrial fibrillation: Secondary | ICD-10-CM | POA: Diagnosis not present

## 2021-05-17 DIAGNOSIS — I4892 Unspecified atrial flutter: Secondary | ICD-10-CM

## 2021-05-17 DIAGNOSIS — I25110 Atherosclerotic heart disease of native coronary artery with unstable angina pectoris: Secondary | ICD-10-CM | POA: Diagnosis not present

## 2021-05-17 DIAGNOSIS — Z951 Presence of aortocoronary bypass graft: Secondary | ICD-10-CM | POA: Diagnosis not present

## 2021-05-17 LAB — CBC
HCT: 29.9 % — ABNORMAL LOW (ref 39.0–52.0)
Hemoglobin: 9.5 g/dL — ABNORMAL LOW (ref 13.0–17.0)
MCH: 30.7 pg (ref 26.0–34.0)
MCHC: 31.8 g/dL (ref 30.0–36.0)
MCV: 96.8 fL (ref 80.0–100.0)
Platelets: 302 10*3/uL (ref 150–400)
RBC: 3.09 MIL/uL — ABNORMAL LOW (ref 4.22–5.81)
RDW: 15.2 % (ref 11.5–15.5)
WBC: 9.3 10*3/uL (ref 4.0–10.5)
nRBC: 0 % (ref 0.0–0.2)

## 2021-05-17 LAB — BASIC METABOLIC PANEL
Anion gap: 9 (ref 5–15)
BUN: 21 mg/dL (ref 8–23)
CO2: 36 mmol/L — ABNORMAL HIGH (ref 22–32)
Calcium: 8.8 mg/dL — ABNORMAL LOW (ref 8.9–10.3)
Chloride: 92 mmol/L — ABNORMAL LOW (ref 98–111)
Creatinine, Ser: 0.96 mg/dL (ref 0.61–1.24)
GFR, Estimated: >60 mL/min (ref >60)
Glucose, Bld: 104 mg/dL — ABNORMAL HIGH (ref 70–99)
Potassium: 3.9 mmol/L (ref 3.5–5.1)
Sodium: 137 mmol/L (ref 135–145)

## 2021-05-17 MED ORDER — POTASSIUM CHLORIDE CRYS ER 20 MEQ PO TBCR
20.0000 meq | EXTENDED_RELEASE_TABLET | Freq: Two times a day (BID) | ORAL | Status: AC
  Administered 2021-05-17 (×2): 20 meq via ORAL
  Filled 2021-05-17 (×2): qty 1

## 2021-05-17 MED ORDER — POTASSIUM CHLORIDE CRYS ER 20 MEQ PO TBCR
20.0000 meq | EXTENDED_RELEASE_TABLET | Freq: Every day | ORAL | Status: DC
  Administered 2021-05-18 – 2021-05-21 (×4): 20 meq via ORAL
  Filled 2021-05-17 (×4): qty 1

## 2021-05-17 MED ORDER — AMIODARONE IV BOLUS ONLY 150 MG/100ML
150.0000 mg | Freq: Once | INTRAVENOUS | Status: AC
  Administered 2021-05-17: 150 mg via INTRAVENOUS
  Filled 2021-05-17: qty 100

## 2021-05-17 MED ORDER — METOPROLOL TARTRATE 12.5 MG HALF TABLET: 12.5000 mg | ORAL_TABLET | Freq: Three times a day (TID) | ORAL | Status: DC

## 2021-05-17 MED ORDER — METOPROLOL TARTRATE 12.5 MG HALF TABLET
12.5000 mg | ORAL_TABLET | Freq: Two times a day (BID) | ORAL | Status: DC
  Administered 2021-05-17: 12.5 mg via ORAL
  Filled 2021-05-17: qty 1

## 2021-05-17 MED ORDER — METOPROLOL TARTRATE 12.5 MG HALF TABLET
12.5000 mg | ORAL_TABLET | Freq: Three times a day (TID) | ORAL | Status: DC
  Administered 2021-05-17 – 2021-05-18 (×3): 12.5 mg via ORAL
  Filled 2021-05-17 (×3): qty 1

## 2021-05-17 NOTE — Plan of Care (Signed)
  Problem: Health Behavior/Discharge Planning: Goal: Ability to manage health-related needs will improve Outcome: Progressing   Problem: Clinical Measurements: Goal: Diagnostic test results will improve Outcome: Progressing Goal: Respiratory complications will improve Outcome: Progressing

## 2021-05-17 NOTE — Progress Notes (Signed)
Mobility Specialist: Progress Note   05/17/21 1548  Mobility  Activity Ambulated in hall  Level of Assistance Minimal assist, patient does 75% or more  Assistive Device Front wheel walker  Distance Ambulated (ft) 470 ft  Mobility Ambulated with assistance in hallway  Mobility Response Tolerated well  Mobility performed by Mobility specialist  $Mobility charge 1 Mobility   Pre-Mobility: 126 HR During Mobility: 125 HR Post-Mobility: 127 HR  Pt c/o pain in his RLE, no rating given, otherwise asx. Pt is sitting EOB after walk with call bell and phone at his side.   [facility] Airyn Ellzey Mobility Specialist Mobility Specialist Phone: [phone]

## 2021-05-17 NOTE — Progress Notes (Addendum)
Progress Note  Patient Name: Robert Bates Date of Encounter: 05/17/2021  [facility] Cardiologist: Mertie Moores, MD   Subjective   In good spirits today. Hopeful to go home soon.   Inpatient Medications    Scheduled Meds:  amiodarone  400 mg Oral BID   aspirin EC  81 mg Oral Daily   Or   aspirin  81 mg Per Tube Daily   atorvastatin  80 mg Oral Daily   Chlorhexidine Gluconate Cloth  6 each Topical Daily   digoxin  0.125 mg Oral Daily   feeding supplement  237 mL Oral TID BM   mouth rinse  15 mL Mouth Rinse BID   metoCLOPramide (REGLAN) injection  10 mg Intravenous Q6H   metoprolol tartrate  12.5 mg Oral BID   mometasone-formoterol  2 puff Inhalation BID   multivitamin with minerals  1 tablet Oral Q1500   pantoprazole  40 mg Oral Daily   potassium chloride  20 mEq Oral BID   [START ON 05/18/2021] potassium chloride  20 mEq Oral Daily   rivaroxaban  20 mg Oral Q supper   sodium chloride flush  10-40 mL Intracatheter Q12H   sodium chloride flush  3 mL Intravenous Q12H   tamsulosin  0.4 mg Oral q AM   torsemide  40 mg Oral Daily   Continuous Infusions:  lactated ringers     PRN Meds: dextrose, lactated ringers, loperamide, metoprolol tartrate, ondansetron (ZOFRAN) IV, polyethylene glycol, traMADol   Vital Signs    Vitals:   05/17/21 0611 05/17/21 0734 05/17/21 0803 05/17/21 1011  BP: 105/85  117/84 114/80  Pulse: (!) 127 (!) 130 (!) 130 (!) 109  Resp: 20 20 17 18   Temp: 98.6 F (37 C)  97.8 F (36.6 C) 98 F (36.7 C)  TempSrc: Oral  Oral Oral  SpO2: 98%  100% 100%  Weight: 73.8 kg     Height:        Intake/Output Summary (Last 24 hours) at 05/17/2021 1122 Last data filed at 05/17/2021 1012 Gross per 24 hour  Intake 440 ml  Output 800 ml  Net -360 ml   Last 3 Weights 05/17/2021 05/16/2021 05/15/2021  Weight (lbs) 162 lb 9.6 oz 163 lb 12.8 oz 166 lb 8 oz  Weight (kg) 73.755 kg 74.299 kg 75.524 kg      Telemetry    Afib RVR, brief episode of SR then  back into suspected 2:1 atrial flutter rates 120-130s - Personally Reviewed  ECG    No new tracing  Physical Exam   GEN: No acute distress.   Neck: No JVD Cardiac: Tachycardic with regular rhythm, no murmurs, rubs, or gallops.  Respiratory: Clear to auscultation bilaterally. GI: Soft, nontender, non-distended  MS: Roughly 1+ bilateral ankle edema.  Edema; No deformity.  Distal SVG extraction site wounds healing well, but very sore. Neuro:  Nonfocal  Psych: Normal affect   Labs    High Sensitivity Troponin:  No results for input(s): TROPONINIHS in the last 720 hours.    Chemistry Recent Labs  Lab 05/13/21 0112 05/15/21 0230 05/17/21 0057  NA 136 138 137  K 3.9 3.8 3.9  CL 100 100 92*  CO2 29 32 36*  GLUCOSE 142* 125* 104*  BUN 24* 23 21  CREATININE 0.96 0.96 0.96  CALCIUM 7.9* 8.5* 8.8*  GFRNONAA >60 >60 >60  ANIONGAP 7 6 9      Hematology Recent Labs  Lab 05/12/21 0411 05/15/21 0230 05/17/21 0057  WBC 8.4 9.4  9.3  RBC 3.01* 3.08* 3.09*  HGB 9.3* 9.5* 9.5*  HCT 28.2* 29.7* 29.9*  MCV 93.7 96.4 96.8  MCH 30.9 30.8 30.7  MCHC 33.0 32.0 31.8  RDW 15.1 15.2 15.2  PLT 145* 268 302    BNPNo results for input(s): BNP, PROBNP in the last 168 hours.   DDimer No results for input(s): DDIMER in the last 168 hours.   Radiology    DG Chest 2 View  Result Date: 05/17/2021 CLINICAL DATA:  Patient status post CABG 05/08/2021 EXAM: CHEST - 2 VIEW COMPARISON:  PA and lateral chest 05/13/2021. FINDINGS: Median sternotomy wires are intact and unchanged. Atrial appendage clip is also unchanged. Small to moderate left pleural effusion and basilar atelectasis have increased. Post radiation change left mid lung appears stable. The right lung is clear. No right effusion. No pneumothorax on the right or left. IMPRESSION: Small to moderate left pleural effusion and basilar atelectasis have increased since the prior exam. The appearance of the chest is otherwise unchanged.  Electronically Signed   By: Inge Rise M.D.   On: 05/17/2021 10:48    Cardiac Studies   Cardiac cath 04/20/21: Significant heavily calcified distal left main disease with significant calcified disease affecting the proximal LAD, large first diagonal, ostial ramus and left circumflex.  Occluded OM 3 with collaterals.  The coronary arteries are codominant with no obstructive disease affecting the right coronary artery.Normal LV systolic function and left ventricular end-diastolic pressure.Afib -RVR during procedure - 5 mg IV metoprolol given    Mid LM to Dist LM lesion is 70% stenosed. Ost Cx to Prox Cx lesion is 70% stenosed. Mid Cx lesion is 90% stenosed. 3rd Mrg lesion is 100% stenosed. Ost LAD to Prox LAD lesion is 80% stenosed. 1st Diag lesion is 70% stenosed. Ramus lesion is 60% stenosed.   Recommendations: Remains in Afib RVR -- > restart anticoagulation and consider amiodarone I will consult CT surgery for CABG evaluation.   Diagnostic Dominance: Co-dominant                      ECHO 04/22/21 LVEF 60 to 65%.  No R WMA.  GRII DD.  Elevated LVEDP.  Normal RV size.  Mildly elevated PAP estimated 41 mmHg with elevated CVP of 15 mmHg..  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  Normal atrial sizes  Patient Profile     80 y.o. 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 Orvan Seen.  Assessment & Plan    CAD s/p CABG x3: with atrial clipping as well. EP wires out yesterday. Treated with ASA, statin, BB. Therapy has been limited with hypotension. Making progress with CR.   Paroxysmal Afib: developed post operatively but has a hx prior to surgery as well. Rates has been difficult to control. Has been transitioned to oral amiodarone but  rates are still elevated.  -- Given IV amio bolus x2 with conversion to SR , but bounced back into afib. Received IV dig 0.25 x2  -- blood pressures have been low causing issues with BB titration.  -- continue amiodarone 400mg  BID, and dig 0.125mg  daily, along with Xarelto -- will attempt further rate control at this point by increasing metoprolol  to 12.5 TID   Hypotension: treated with midodrine 10mg  TID, cut back to 5mg  TID and then stopped. A few soft readings but stable   HLD: on high dose statin   COPD w/ hx of lung CA: follows with oncology   LE edema: torsemide 40mg  daily, good diuresis. 1L UOP only net + 867 cc,  weight down 166.5>>163.8>>162.6lbs -- follow I&O and daily weights   Abd discomfort/loose stools: Had been on colchicine but DC'ed. Significant improvement.   Anemia: post op acute blood loss anemia -- Hgb improved today at 9.5   For questions or updates, please contact [place] Please consult [email or web address] for contact info under        Signed, Reino Bellis, NP  05/17/2021, 11:22 AM     ATTENDING ATTESTATION  I have seen, examined and evaluated the patient this afternoon along with Reino Bellis, NP-C.  After reviewing all the available data and chart, we discussed the patients laboratory, study & physical findings as well as symptoms in detail. I agree with her findings, examination as well as impression recommendations as per our discussion.    Attending adjustments noted in italics.   Looks and feels better, but unfortunately remains tachycardic but appears to be slow atrial flutter with a rate in the 130s.  Rhythm is very regular and resistant to rate control which is consistent with atrial flutter.  At this point we are really limited to just simply waiting for amiodarone to be sufficiently noted to slow heart rate down.  Unfortunately atrial flutter is very difficult to control.  I attempted to schedule him for TEE cardioversion tomorrow to get  him out of atrial flutter, but there is no availability.  We will still recheck in the morning, if there is a cancellation we will take the spot.  If we can get him out of atrial flutter, atrial fibrillation or sinus rate would probably be easier to control. -Unfortunately we really cannot titrate beta-blocker any further.  We will try to go to 12.5 mg twice daily>  Continue to monitor for other reasons for tachycardia such as worsening edema.  Hemoglobin stable.  No longer having loose stools from colchicine.  He does not seem to be volume depleted as he has persistent edema. Continue gentle diuresis with oral torsemide   Glenetta Hew, M.D., M.S. Interventional Cardiologist   Pager # [phone] Phone # [phone] [address] [place], [place] [address]

## 2021-05-17 NOTE — Progress Notes (Signed)
Occupational Therapy Treatment and Discharge Patient Details Name: Robert Bates MRN: [id number] DOB: 09-Aug-1941 Today's Date: 05/17/2021    History of present illness Robert Bates is a 80 y.o. male admitted for CABGx3, TEE, and clipping of atrial appendage on 7/5 due to CAD. PMH: hx of PAF, tobacco use with COPD and history of lung CA, peripheral vascular disease, HLD, chronic diastolic CHF and DM2   OT comments  This 80 yo male admitted and underwent above seen today and is now more at an overall setup/S level for all basic ADLs except for doffing/donning right sock and for transfers into/out of tub (he has grab bars and a seat at home). All education completed with pt and wife in room without any further questions concerning basic ADLs. Acute OT will D/C patient.  Follow Up Recommendations  No OT follow up;Supervision - Intermittent    Equipment Recommendations  None recommended by OT       Precautions / Restrictions Precautions Precautions: Sternal;Fall Restrictions Weight Bearing Restrictions: No       Mobility Bed Mobility               General bed mobility comments: up in recliner upon arrival    Transfers Overall transfer level: Needs assistance Equipment used: None Transfers: Sit to/from Stand Sit to Stand: Supervision              Balance Overall balance assessment: Needs assistance Sitting-balance support: No upper extremity supported;Feet supported Sitting balance-Leahy Scale: Good     Standing balance support: No upper extremity supported;During functional activity Standing balance-Leahy Scale: Good Standing balance comment: standing at sink to wash hands and comb hair                           ADL either performed or assessed with clinical judgement   ADL Overall ADL's : Needs assistance/impaired     Grooming: Supervision/safety;Standing;Wash/dry hands;Brushing hair             Upper Body Dressing Details (indicate cue  type and reason): We discussed the two options with donning pullover shirts to make sure he is still following sternal precautions.   Lower Body Dressing Details (indicate cue type and reason): He can cross his LLE over RLE to get to his foot to doff and donn sock, he cannot do this for his RLE (even before surgery per his report). For now his wife will A him with his right sock. As far as underwear and pants we discussed only using elastic waist pants and putting on these two garments before socks. Toilet Transfer: Copy Details (indicate cue type and reason): no AD   Toileting - Clothing Manipulation Details (indicate cue type and reason): We discussed how to wipe post a bowel movement and not pull too much on chest.             Vision Patient Visual Report: No change from baseline            Cognition Arousal/Alertness: Awake/alert Behavior During Therapy: WFL for tasks assessed/performed Overall Cognitive Status: Within Functional Limits for tasks assessed Area of Impairment: Memory                     Memory: Decreased recall of precautions         General Comments: cues needed to recall sternal precautions just with asking (he was able to recall no pushing)  Pertinent Vitals/ Pain       Pain Assessment: Faces Faces Pain Scale: Hurts a little bit Pain Location: RLE Pain Descriptors / Indicators: Aching;Sore Pain Intervention(s): Limited activity within patient's tolerance;Monitored during session         Frequency  Min 2X/week        Progress Toward Goals  OT Goals(current goals can now be found in the care plan section)  Progress towards OT goals:  (education complete)  Acute Rehab OT Goals Patient Stated Goal: to go home  Plan Discharge plan needs to be updated       AM-PAC OT "6 Clicks" Daily Activity     Outcome Measure   Help from another person eating meals?: None Help from another  person taking care of personal grooming?: A Little Help from another person toileting, which includes using toliet, bedpan, or urinal?: A Little Help from another person bathing (including washing, rinsing, drying)?: A Little Help from another person to put on and taking off regular upper body clothing?: A Little Help from another person to put on and taking off regular lower body clothing?: A Little 6 Click Score: 19    End of Session    OT Visit Diagnosis: Pain;Unsteadiness on feet (R26.81)   Activity Tolerance Patient tolerated treatment well   Patient Left in chair;with call bell/phone within reach             Time: 1231-1250 OT Time Calculation (min): 19 min  Charges: OT General Charges $OT Visit: 1 Visit OT Treatments $Self Care/Home Management : 8-22 mins  [place], OTR/L Acute [employer] Pager [phone] Office [phone]     Almon Register 05/17/2021, 1:25 PM

## 2021-05-17 NOTE — Care Management Important Message (Signed)
Important Message  Patient Details  Name: Robert Bates MRN: [id number] Date of Birth: [birth year]   Medicare Important Message Given:  Yes     Shelda Altes 05/17/2021, 8:25 AM

## 2021-05-17 NOTE — Progress Notes (Signed)
      [address]       [place] [address]             [phone]        9 Days Post-Op Procedure(s) (LRB): CORONARY ARTERY BYPASS GRAFTING (CABG)X 3 USING LEFT INTERNAL MAMMARY ARTERY AND RIGHT GREATER SAPEHNOUS VEIN (N/A) INDOCYANINE GREEN FLUORESCENCE IMAGING (ICG) (N/A) TRANSESOPHAGEAL ECHOCARDIOGRAM (TEE) (N/A) CLIPPING OF ATRIAL APPENDAGE USING 45 ATRICLIP (Left) APPLICATION OF CELL SAVER (N/A) ENDOVEIN HARVEST OF GREATER SAPHENOUS VEIN (Right)  Subjective: Patent just waking up this am. His right lower leg hurts (at distal most wound)  Objective: Vital signs in last 24 hours: Temp:  [97.8 F (36.6 C)-98.6 F (37 C)] 98.6 F (37 C) (07/14 0611) Pulse Rate:  [72-127] 127 (07/14 0611) Cardiac Rhythm: Normal sinus rhythm;Atrial fibrillation (07/13 2300) Resp:  [16-20] 20 (07/14 0611) BP: (97-127)/(62-86) 105/85 (07/14 0611) SpO2:  [89 %-98 %] 98 % (07/14 0611) Weight:  [73.8 kg] 73.8 kg (07/14 0611)  Pre op weight  72.3 kg Current Weight  05/17/21 73.8 kg      Intake/Output from previous day: 07/13 0701 - 07/14 0700 In: 580 [P.O.:380; I.V.:200] Out: 1075 [Urine:1075]   Physical Exam:  Cardiovascular: IRRR IRRR Pulmonary: Slightly diminished bibasilar breath sounds L>R Abdomen: Soft, non tender, bowel sounds present. Extremities: + bilateral lower extremity edema. Ecchymosis right thigh. Trace erythema, no drainage from right lower "stab wound". He likely has a small hematoma. Wounds: Sternal wound is clean and dry.  No erythema or signs of infection. RLE wound  clean and dry  Lab Results: CBC: Recent Labs    05/15/21 0230 05/17/21 0057  WBC 9.4 9.3  HGB 9.5* 9.5*  HCT 29.7* 29.9*  PLT 268 302    BMET:  Recent Labs    05/15/21 0230 05/17/21 0057  NA 138 137  K 3.8 3.9  CL 100 92*  CO2 32 36*  GLUCOSE 125* 104*  BUN 23 21  CREATININE 0.96 0.96  CALCIUM 8.5* 8.8*     PT/INR:  Lab Results  Component Value Date   INR  1.4 (H) 05/08/2021   INR 1.1 05/04/2021   INR 1.01 09/30/2017   ABG:  INR: Will add last result for INR, ABG once components are confirmed Will add last 4 CBG results once components are confirmed  Assessment/Plan:  1. CV - Had LA clip at surgery. PAF. On Amiodarone 400 mg bid and Midodrine 10 mg tid, Lopressor 25 mg bid, Digoxin 0.125 mg daily, and Rivaroxaban. Because of labile BP, Lopressor 25 mg not given last evening. With SBP low 100's will decrease Lopressor to 12.5 mg bid. Will not use Digoxin long term as not recommended by cardiology. Follow up in a fib clinic has been arranged 2.  Pulmonary - History of COPD. Continue Dulera. On room air. Encourage incentive spirometer 3. Volume Overload - ContinueTorsemide  4.  Expected post op acute blood loss anemia -H and H this am stable at 9.5 and 29.9 5. Supplement potassium 6. Continue to encourage oral intake and supplements 7. Deconditioned-PT/OT 8. Regarding small hematoma, RLE ("stab wound"),  does not need antibiotic at this time but will monitor 9. Once a fib rate better controlled, will consider discharge  Robert Bates Robert ZimmermanPA-C 05/17/2021,6:57 AM

## 2021-05-17 NOTE — Progress Notes (Signed)
CARDIAC REHAB PHASE I   PRE:  Rate/Rhythm: 129 ?ST with some few runs of PACS  BP:  Supine:   Sitting: 104/70  Standing:    SaO2: 97%RA  MODE:  Ambulation: 470 ft   POST:  Rate/Rhythm: 127 ?ST  BP:  Supine:   Sitting: 114/80  Standing:    SaO2: 94%RA 0948-1025 Pt c/o right leg pain at wound site. Firm. Pt able to walk on right leg and stated felt better after walk. I watched HR whole walk and he never got above 127 with activity.  Was 129 prior to walk sitting in recliner. Pt tolerated well and will walk with Mobility specialist later. Put pillow under right leg. Walked 470 ft on RA with rolling walker independently. Reinforced sternal precautions.   Graylon Good, RN BSN  05/17/2021 10:22 AM

## 2021-05-18 ENCOUNTER — Inpatient Hospital Stay (HOSPITAL_COMMUNITY): Payer: Medicare Other

## 2021-05-18 DIAGNOSIS — I25110 Atherosclerotic heart disease of native coronary artery with unstable angina pectoris: Secondary | ICD-10-CM | POA: Diagnosis not present

## 2021-05-18 DIAGNOSIS — E8779 Other fluid overload: Secondary | ICD-10-CM | POA: Diagnosis not present

## 2021-05-18 DIAGNOSIS — I4891 Unspecified atrial fibrillation: Secondary | ICD-10-CM | POA: Diagnosis not present

## 2021-05-18 DIAGNOSIS — Z951 Presence of aortocoronary bypass graft: Secondary | ICD-10-CM | POA: Diagnosis not present

## 2021-05-18 HISTORY — PX: IR THORACENTESIS ASP PLEURAL SPACE W/IMG GUIDE: IMG5380

## 2021-05-18 MED ORDER — CEPHALEXIN 500 MG PO CAPS
500.0000 mg | ORAL_CAPSULE | Freq: Three times a day (TID) | ORAL | Status: DC
  Administered 2021-05-18 (×2): 500 mg via ORAL
  Filled 2021-05-18 (×2): qty 1

## 2021-05-18 MED ORDER — AMIODARONE HCL 200 MG PO TABS
200.0000 mg | ORAL_TABLET | Freq: Two times a day (BID) | ORAL | Status: DC
  Administered 2021-05-19 – 2021-05-21 (×5): 200 mg via ORAL
  Filled 2021-05-18 (×5): qty 1

## 2021-05-18 MED ORDER — LIDOCAINE HCL 1 % IJ SOLN
INTRAMUSCULAR | Status: AC
  Filled 2021-05-18: qty 20

## 2021-05-18 MED ORDER — METOPROLOL TARTRATE 25 MG PO TABS
25.0000 mg | ORAL_TABLET | Freq: Two times a day (BID) | ORAL | Status: DC
  Administered 2021-05-18 – 2021-05-20 (×4): 25 mg via ORAL
  Filled 2021-05-18 (×5): qty 1

## 2021-05-18 MED ORDER — RIVAROXABAN 20 MG PO TABS: 20.0000 mg | ORAL_TABLET | Freq: Every day | ORAL | Status: DC

## 2021-05-18 MED ORDER — RIVAROXABAN 20 MG PO TABS
20.0000 mg | ORAL_TABLET | Freq: Every day | ORAL | Status: DC
  Administered 2021-05-18 – 2021-05-20 (×3): 20 mg via ORAL
  Filled 2021-05-18 (×3): qty 1

## 2021-05-18 MED ORDER — AMIODARONE HCL 200 MG PO TABS: 200.0000 mg | ORAL_TABLET | Freq: Every day | ORAL | Status: DC

## 2021-05-18 MED ORDER — LIDOCAINE HCL 1 % IJ SOLN
INTRAMUSCULAR | Status: DC | PRN
  Administered 2021-05-18: 10 mL

## 2021-05-18 NOTE — Progress Notes (Addendum)
      [address]       [place] [address]             [phone]        10 Days Post-Op Procedure(s) (LRB): CORONARY ARTERY BYPASS GRAFTING (CABG)X 3 USING LEFT INTERNAL MAMMARY ARTERY AND RIGHT GREATER SAPEHNOUS VEIN (N/A) INDOCYANINE GREEN FLUORESCENCE IMAGING (ICG) (N/A) TRANSESOPHAGEAL ECHOCARDIOGRAM (TEE) (N/A) CLIPPING OF ATRIAL APPENDAGE USING 45 ATRICLIP (Left) APPLICATION OF CELL SAVER (N/A) ENDOVEIN HARVEST OF GREATER SAPHENOUS VEIN (Right)  Subjective: Patent just waking up this am. His right lower leg hurts (at distal most wound) and hard to put weight on right foot  Objective: Vital signs in last 24 hours: Temp:  [97.6 F (36.4 C)-98.5 F (36.9 C)] 98.2 F (36.8 C) (07/15 0310) Pulse Rate:  [109-130] 120 (07/15 0310) Cardiac Rhythm: Other (Comment) (07/14 1945) Resp:  [17-20] 18 (07/15 0310) BP: (94-117)/(66-84) 94/66 (07/15 0310) SpO2:  [92 %-100 %] 92 % (07/15 0310) Weight:  [73.4 kg] 73.4 kg (07/15 0310)  Pre op weight  72.3 kg Current Weight  05/18/21 73.4 kg      Intake/Output from previous day: 07/14 0701 - 07/15 0700 In: 240 [P.O.:240] Out: 2000 [Urine:2000]   Physical Exam:  Cardiovascular: RRR Pulmonary: Slightly diminished bibasilar breath sounds L>R Abdomen: Soft, non tender, bowel sounds present. Extremities: + bilateral lower extremity edema. Ecchymosis right thigh. Increasing erythema, no drainage from right lower "stab wound". He has a small hematoma. Wounds: Sternal wound is clean and dry.  No erythema or signs of infection. RLE wound  clean and dry  Lab Results: CBC: Recent Labs    05/17/21 0057  WBC 9.3  HGB 9.5*  HCT 29.9*  PLT 302    BMET:  Recent Labs    05/17/21 0057  NA 137  K 3.9  CL 92*  CO2 36*  GLUCOSE 104*  BUN 21  CREATININE 0.96  CALCIUM 8.8*     PT/INR:  Lab Results  Component Value Date   INR 1.4 (H) 05/08/2021   INR 1.1 05/04/2021   INR 1.01 09/30/2017   ABG:   INR: Will add last result for INR, ABG once components are confirmed Will add last 4 CBG results once components are confirmed  Assessment/Plan:  1. CV - Had LA clip at surgery. PAF. On Amiodarone 400 mg bid and Midodrine 10 mg tid, Lopressor 12.5 mg bid, Digoxin 0.125 mg daily, and Rivaroxaban. Has been given IV bolus Amiodarone intermittently without much success. Will not use Digoxin long term as not recommended by cardiology. Patient's rate is NOT well controlled;cardiology increased Lopressor to 12.5 mg tid which will be given for the first time today. Follow up in a fib clinic has been arranged 2.  Pulmonary - History of COPD. CXR yesterday showed small to moderate left pleural effusion;will order a left thoracentesis. Continue Dulera. On room air. Encourage incentive spirometer 3. Volume Overload - ContinueTorsemide  4.  Expected post op acute blood loss anemia -H and H this am stable at 9.5 and 29.9 5. Deconditioned-PT/OT 6. Regarding small hematoma, mild cellulitis, RLE ("stab wound"), will start oral antibiotic as increased erythema and pain 7. Once a fib rate better controlled, will consider discharge  Adrine Hayworth M ZimmermanPA-C 05/18/2021,7:07 AM

## 2021-05-18 NOTE — Progress Notes (Signed)
Progress Note  Patient Name: Robert Bates Date of Encounter: 05/18/2021  [facility] Cardiologist: Mertie Moores, MD   Subjective   Was initially sound asleep when I came to see him.  Then he was down getting his thoracentesis.  Currently sitting up eating lunch.  Feeling relatively well although he says his left flank and back is somewhat sore from the procedure..  No complaints of dyspnea or chest pain  Inpatient Medications    Scheduled Meds:  amiodarone  400 mg Oral BID   aspirin EC  81 mg Oral Daily   Or   aspirin  81 mg Per Tube Daily   atorvastatin  80 mg Oral Daily   cephALEXin  500 mg Oral Q8H   Chlorhexidine Gluconate Cloth  6 each Topical Daily   digoxin  0.125 mg Oral Daily   feeding supplement  237 mL Oral TID BM   lidocaine       mouth rinse  15 mL Mouth Rinse BID   metoprolol tartrate  12.5 mg Oral TID WC   mometasone-formoterol  2 puff Inhalation BID   multivitamin with minerals  1 tablet Oral Q1500   pantoprazole  40 mg Oral Daily   potassium chloride  20 mEq Oral Daily   rivaroxaban  20 mg Oral Q supper   sodium chloride flush  10-40 mL Intracatheter Q12H   sodium chloride flush  3 mL Intravenous Q12H   tamsulosin  0.4 mg Oral q AM   torsemide  40 mg Oral Daily   Continuous Infusions:  lactated ringers     PRN Meds: dextrose, lactated ringers, lidocaine, loperamide, metoprolol tartrate, ondansetron (ZOFRAN) IV, polyethylene glycol, traMADol   Vital Signs    Vitals:   05/18/21 0743 05/18/21 1021 05/18/21 1100 05/18/21 1220  BP: 103/71  138/83 115/86  Pulse: 81 65  67  Resp: 19   14  Temp: 97.9 F (36.6 C)   97.6 F (36.4 C)  TempSrc: Oral   Oral  SpO2: 91%   94%  Weight:      Height:        Intake/Output Summary (Last 24 hours) at 05/18/2021 1354 Last data filed at 05/18/2021 1300 Gross per 24 hour  Intake 240 ml  Output 1500 ml  Net -1260 ml   Last 3 Weights 05/18/2021 05/17/2021 05/16/2021  Weight (lbs) 161 lb 13.1 oz 162 lb 9.6  oz 163 lb 12.8 oz  Weight (kg) 73.4 kg 73.755 kg 74.299 kg      Telemetry    Afib RVR, brief episode of SR then back into suspected 2:1 atrial flutter rates 120-130s - Personally Reviewed  ECG    No new tracing  Physical Exam   GEN: Sitting up in the chair after bathing himself.  No obvious distress, just sore Neck: No frank JVD or bruit. Cardiac: Now back in regular rate and rhythm with normal S1 and S2.  No M/R/G. Respiratory: Mild left basal crackles but otherwise CTA B, nonlabored.  Somewhat difficult to take deep breath GI: soft/NT/ND NABS.  No HSM. MS: Bilateral ankle pitting edema.  Distal stab wound from vein extraction somewhat sore, but overall swelling better. Neuro: Nonfocal Psych: Normal mood and affect.  Labs    High Sensitivity Troponin:  No results for input(s): TROPONINIHS in the last 720 hours.    Chemistry Recent Labs  Lab 05/13/21 0112 05/15/21 0230 05/17/21 0057  NA 136 138 137  K 3.9 3.8 3.9  CL 100 100 92*  CO2 29 32 36*  GLUCOSE 142* 125* 104*  BUN 24* 23 21  CREATININE 0.96 0.96 0.96  CALCIUM 7.9* 8.5* 8.8*  GFRNONAA >60 >60 >60  ANIONGAP 7 6 9      Hematology Recent Labs  Lab 05/12/21 0411 05/15/21 0230 05/17/21 0057  WBC 8.4 9.4 9.3  RBC 3.01* 3.08* 3.09*  HGB 9.3* 9.5* 9.5*  HCT 28.2* 29.7* 29.9*  MCV 93.7 96.4 96.8  MCH 30.9 30.8 30.7  MCHC 33.0 32.0 31.8  RDW 15.1 15.2 15.2  PLT 145* 268 302    BNPNo results for input(s): BNP, PROBNP in the last 168 hours.   DDimer No results for input(s): DDIMER in the last 168 hours.   Radiology    DG Chest 1 View  Result Date: 05/18/2021 CLINICAL DATA:  Left-sided thoracentesis EXAM: CHEST  1 VIEW COMPARISON:  05/17/2021 FINDINGS: Post CABG changes to the heart and mediastinum. Left atrial appendage clip. Stable cardiomediastinal contours. Significant interval reduction in previously seen left-sided pleural effusion. No significant residual pleural fluid volume. Improved aeration of  the left lung base. Persistent radiation changes within the left mid lung. No pneumothorax. IMPRESSION: Significant interval reduction in previously seen left-sided pleural effusion. No pneumothorax. Electronically Signed   By: Davina Poke D.O.   On: 05/18/2021 11:55   DG Chest 2 View  Result Date: 05/17/2021 CLINICAL DATA:  Patient status post CABG 05/08/2021 EXAM: CHEST - 2 VIEW COMPARISON:  PA and lateral chest 05/13/2021. FINDINGS: Median sternotomy wires are intact and unchanged. Atrial appendage clip is also unchanged. Small to moderate left pleural effusion and basilar atelectasis have increased. Post radiation change left mid lung appears stable. The right lung is clear. No right effusion. No pneumothorax on the right or left. IMPRESSION: Small to moderate left pleural effusion and basilar atelectasis have increased since the prior exam. The appearance of the chest is otherwise unchanged. Electronically Signed   By: Inge Rise M.D.   On: 05/17/2021 10:48   IR THORACENTESIS ASP PLEURAL SPACE W/IMG GUIDE  Result Date: 05/18/2021 INDICATION: Shortness of breath. Status post recent CABG. Left-sided pleural effusion. Request for therapeutic thoracentesis. EXAM: ULTRASOUND GUIDED LEFT THORACENTESIS MEDICATIONS: 1% plain lidocaine, 5 mL COMPLICATIONS: None immediate. PROCEDURE: An ultrasound guided thoracentesis was thoroughly discussed with the patient and questions answered. The benefits, risks, alternatives and complications were also discussed. The patient understands and wishes to proceed with the procedure. Written consent was obtained. Ultrasound was performed to localize and mark an adequate pocket of fluid in the left chest. The area was then prepped and draped in the normal sterile fashion. 1% Lidocaine was used for local anesthesia. Under ultrasound guidance a 6 Fr Safe-T-Centesis catheter was introduced. Thoracentesis was performed. The catheter was removed and a dressing applied.  FINDINGS: A total of approximately 1.1 L of blood-tinged fluid was removed. IMPRESSION: Successful ultrasound guided left thoracentesis yielding 1.1 L of pleural fluid. Read by: Ascencion Dike PA-C Electronically Signed   By: Corrie Mckusick D.O.   On: 05/18/2021 11:58    Cardiac Studies   Cardiac cath 04/20/21: Significant heavily calcified distal left main disease with significant calcified disease affecting the proximal LAD, large first diagonal, ostial ramus and left circumflex.  Occluded OM 3 with collaterals.  The coronary arteries are codominant with no obstructive disease affecting the right coronary artery.Normal LV systolic function and left ventricular end-diastolic pressure.Afib -RVR during procedure - 5 mg IV metoprolol given    Mid LM to Dist LM lesion is 70%  stenosed. Ost Cx to Prox Cx lesion is 70% stenosed. Mid Cx lesion is 90% stenosed. 3rd Mrg lesion is 100% stenosed. Ost LAD to Prox LAD lesion is 80% stenosed. 1st Diag lesion is 70% stenosed. Ramus lesion is 60% stenosed.   Recommendations: Remains in Afib RVR -- > restart anticoagulation and consider amiodarone I will consult CT surgery for CABG evaluation.   Diagnostic Dominance: Co-dominant                      ECHO 04/22/21 LVEF 60 to 65%.  No R WMA.  GRII DD.  Elevated LVEDP.  Normal RV size.  Mildly elevated PAP estimated 41 mmHg with elevated CVP of 15 mmHg..  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  Normal atrial sizes  Patient Profile     80 y.o. male with  a hx of PAF, tobacco use with COPD hx of lung CA, PVD, HLD, chronic diastolic HF, SM-2 on xarelto, CAD and now hospitalized with CABG and clipping of atrial appendage now on improving but with atrial fib RVR.  We were reconsulted post-op seen 05/13/2021 for the evaluation/management of recurrent A. fib/flutter at the request of Dr. Orvan Seen.  Assessment & Plan    CAD s/p CABG x3 w/ LAA Clipping: wires out yesterday.  On aspirin and statin along with low-dose  beta-blocker.  Limited by hypotension. Making progress with cardiac rehab. Thoracentesis today with roughly 1 L drainage. Seems to be getting close to discharge.  Paroxysmal Afib / Persistent Atrial Flutter: Initially A. fib postop, difficult rate control.  Initially cardioverted with amiodarone but then had recurrent atrial flutter that has become persistent with rates roughly 120 to 130 bpm despite being on amiodarone, low-dose beta-blocker and digoxin. --> Has been fully loaded on amiodarone, coming close to 1 week  of 400 mg twice daily as of this evening.  -> he has also received several boluses of IV amiodarone.   Would probably be okay to decrease to 200 mg twice daily on discharge which will be continued x1 week followed by 200 mg daily  He s also on digoxin, would recommend stopping on discharge Would consolidate metoprolol to 25 mg twice daily on discharge Is now on Xarelto for anticoagulation.    Hypotension: t no longer on midodrine   HLD: High-dose atorvastatin   COPD w/ hx of lung CA: follows with oncology   LE edema:/Postop volume overload Stable urine output on 40 mg daily torsemide.  Net diuresis roughly 1 L plus additional 1 L from thoracentesis -- follow I&O and daily weights   Abd discomfort/loose stools: Had been on colchicine but DC'ed. Significant improvement.   Anemia: post op acute blood loss anemia -- Stable hemoglobin at 9.5 today.     [facility] will sign off.  We will follow peripherally provided he remains in sinus rhythm. Medication Recommendations:   For d/c  -reduce amiodarone to 200 mg twice daily x1 week followed by 200 mg daily until seen in follow-up Convert metoprolol to 25 mg twice daily Other recommendations (labs, testing, etc): Routine lab monitoring postop on torsemide-BMP prior to cardiology follow-up Follow up as an outpatient: We will arrange outpatient follow-up with Dr. Acie Fredrickson or APP.    For questions or updates, please  contact [facility] Please consult [email or web address] for contact info under        Signed, Glenetta Hew, MD  05/18/2021, 1:54 PM    Glenetta Hew, M.D., M.S. Interventional Cardiologist   Pager # [phone] Phone #  [address] [place], [place] [address]

## 2021-05-18 NOTE — Procedures (Signed)
PROCEDURE SUMMARY:  Successful US guided left thoracentesis. Yielded 1.1 L of blood tinged fluid. Pt tolerated procedure well. No immediate complications.  Specimen was not sent for labs. CXR ordered.  EBL < 5 mL  Ascencion Dike PA-C 05/18/2021 11:25 AM

## 2021-05-18 NOTE — Progress Notes (Signed)
Physical Therapy Treatment Patient Details Name: Robert Bates MRN: [id number] DOB: 06/16/1941 Today's Date: 05/18/2021    History of Present Illness Robert Bates is a 80 y.o. male admitted for CABGx3, TEE, and clipping of atrial appendage on 7/5 due to CAD. PMH: hx of PAF, tobacco use with COPD and history of lung CA, peripheral vascular disease, HLD, chronic diastolic CHF and DM2    PT Comments    Pt received seated EOB, agreeable to therapy session and with good participation in gait/stair training. Pt reports continued musculoskeletal discomfort and RLE discomfort at incision sites but mobilizing at mostly min guard to Supervision level with RW support. Pt performed 6 steps and reports moderate fatigue (4-5/10 modified RPE) after stair training.  Pt continues to benefit from PT services to progress toward functional mobility goals.   Follow Up Recommendations  Home health PT     Equipment Recommendations  None recommended by PT;Other (comment) (may consider RW due to RLE pain if he does not already have one)    Recommendations for Other Services       Precautions / Restrictions Precautions Precautions: Sternal;Fall Precaution Comments: booklet previously issued; needs min cues for Move in the Tube precs during mobility tasks Restrictions Weight Bearing Restrictions: No Other Position/Activity Restrictions: sternal precs    Mobility  Bed Mobility               General bed mobility comments: pt received seated EOB    Transfers Overall transfer level: Needs assistance Equipment used: None;Rolling walker (2 wheeled) Transfers: Sit to/from Stand Sit to Stand: Supervision         General transfer comment: from EOB, able to stand with and without RW support and min cues for safe UE placement, no physical assist needed  Ambulation/Gait Ambulation/Gait assistance: Supervision Gait Distance (Feet): 125 Feet Assistive device: Rolling walker (2 wheeled) Gait  Pattern/deviations: Step-through pattern;Decreased stride length Gait velocity: decr Gait velocity interpretation: <1.31 ft/sec, indicative of household ambulator General Gait Details: fairly steady, SpO2 WNL on RA throughout but pt with mild DOE (1/4); light UE reliance on RW and self-limiting distance due to moderate fatigue after stair training   Stairs Stairs: Yes Stairs assistance: Min guard Stair Management: One rail Right;Step to pattern;Forwards Number of Stairs: 6 General stair comments: pt ascended/descended 6 steps with step-to pattern, no LOB, min cues for Move in the Tube precautions and not to "wing out" supportive RUE/elbow on handrail; min guard for safety   Wheelchair Mobility    Modified Rankin (Stroke Patients Only)       Balance Overall balance assessment: Needs assistance Sitting-balance support: No upper extremity supported;Feet supported Sitting balance-Leahy Scale: Good     Standing balance support: No upper extremity supported;During functional activity Standing balance-Leahy Scale: Good Standing balance comment: able to stand at bedside unsupported while completing toileting task with urinal but utilized RW for ambulation in room/hallway for safety                            Cognition Arousal/Alertness: Awake/alert Behavior During Therapy: [facility] for tasks assessed/performed Overall Cognitive Status: Within Functional Limits for tasks assessed Area of Impairment: Memory                     Memory: Decreased recall of precautions         General Comments: cues needed to recall sternal precautions just with asking (he was able to recall no pushing),  pt needs reminders at times for proper technique with RW/railing to prevent "winging out" at elbows; pleasant, participatory      Exercises      General Comments General comments (skin integrity, edema, etc.): BP 115/86 (93) pre-mobility and no s/sx dizziness; HR 67-70's with  exertion and SpO2 94-100% on RA      Pertinent Vitals/Pain Pain Assessment: 0-10 Pain Score: 4  Pain Location: RLE (knee?) and mild pain at L chest incision site Pain Descriptors / Indicators: Aching;Sore;Discomfort Pain Intervention(s): Monitored during session;Premedicated before session;Repositioned    Home Living                      Prior Function            PT Goals (current goals can now be found in the care plan section) Acute Rehab PT Goals Patient Stated Goal: to go home PT Goal Formulation: With patient Time For Goal Achievement: 05/22/21 Potential to Achieve Goals: Good Progress towards PT goals: Progressing toward goals    Frequency    Min 2X/week      PT Plan Current plan remains appropriate    Co-evaluation              AM-PAC PT "6 Clicks" Mobility   Outcome Measure  Help needed turning from your back to your side while in a flat bed without using bedrails?: None Help needed moving from lying on your back to sitting on the side of a flat bed without using bedrails?: A Little Help needed moving to and from a bed to a chair (including a wheelchair)?: A Little Help needed standing up from a chair using your arms (e.g., wheelchair or bedside chair)?: A Little Help needed to walk in hospital room?: A Little Help needed climbing 3-5 steps with a railing? : A Little 6 Click Score: 19    End of Session Equipment Utilized During Treatment: Gait belt Activity Tolerance: Patient tolerated treatment well Patient left: in chair;with call bell/phone within reach;Other (comment) (pt instructed on use of call bell prior to returning to bed due to lines/table in the way) Nurse Communication: Mobility status PT Visit Diagnosis: Muscle weakness (generalized) (M62.81)     Time: 6568-1275 PT Time Calculation (min) (ACUTE ONLY): 22 min  Charges:  $Gait Training: 8-22 mins                     Phuc Kluttz P., PTA Acute Rehabilitation Services Pager:  [phone] Office: Home 05/18/2021, 2:58 PM

## 2021-05-18 NOTE — Progress Notes (Signed)
CARDIAC REHAB PHASE I   Went to offer to walk with pt again. Pt needing to use BR, states recent ambulation with PT. Pt helped to BR. Encouraged pt continue to sit in recliner and use IS. Will continue to follow.  Rufina Falco, RN BSN 05/18/2021 1:32 PM

## 2021-05-18 NOTE — Progress Notes (Signed)
CARDIAC REHAB PHASE I   Offered to walk with pt. Pt c/o fatigue from lack of sleep and leg pain. Requesting more time for rest. Stressed importance of IS use and ambulation. Will f/u to continue to encourage ambulation.  Rufina Falco, RN BSN 05/18/2021 09:40 AM

## 2021-05-18 NOTE — Progress Notes (Addendum)
Pt hr sustaining 130's 5 mg of prn Lopresor given per MD order, pt also having pain to graft site with slight redness with some difficulty walking

## 2021-05-19 ENCOUNTER — Inpatient Hospital Stay (HOSPITAL_COMMUNITY): Payer: Medicare Other

## 2021-05-19 MED ORDER — CEFAZOLIN SODIUM-DEXTROSE 1-4 GM/50ML-% IV SOLN
1.0000 g | Freq: Three times a day (TID) | INTRAVENOUS | Status: DC
  Administered 2021-05-19 – 2021-05-21 (×6): 1 g via INTRAVENOUS
  Filled 2021-05-19 (×7): qty 50

## 2021-05-19 NOTE — Progress Notes (Signed)
CARDIAC REHAB PHASE I   PRE:  Rate/Rhythm: 54 SR  BP:  Sitting: 128/72      SaO2: 95 RA  MODE:  Ambulation: 470 ft   POST:  Rate/Rhythm: 89 SR  BP:  Sitting: 123/67    SaO2: 94 RA   After some convincing, pt agreeable to try to walk to door. Pt ambulated and turned around at doorway. Pt returned to bed. After encouragement from wife, pt agreeable to ambulate further in hallway. Pt ambulated 461ft in hallway assist of one with front wheel walker. Pt with slow, hobbling gait. Pt returned to bed. Stressed importance of continued ambulation and IS use throughout weekend. Brief d/c ed completed with pt an wife. Materials at bedside. Will refer to CRP [facility] [place].  [id number] Rufina Falco, RN BSN 05/19/2021 11:38 AM

## 2021-05-19 NOTE — Progress Notes (Addendum)
[address]       [place] [address]             [phone]      11 Days Post-Op Procedure(s) (LRB): CORONARY ARTERY BYPASS GRAFTING (CABG)X 3 USING LEFT INTERNAL MAMMARY ARTERY AND RIGHT GREATER SAPEHNOUS VEIN (N/A) INDOCYANINE GREEN FLUORESCENCE IMAGING (ICG) (N/A) TRANSESOPHAGEAL ECHOCARDIOGRAM (TEE) (N/A) CLIPPING OF ATRIAL APPENDAGE USING 45 ATRICLIP (Left) APPLICATION OF CELL SAVER (N/A) ENDOVEIN HARVEST OF GREATER SAPHENOUS VEIN (Right) Subjective: Feels pretty well, right leg is pretty tender  Objective: Vital signs in last 24 hours: Temp:  [97.6 F (36.4 C)-97.9 F (36.6 C)] 97.7 F (36.5 C) (07/16 0250) Pulse Rate:  [65-81] 70 (07/16 0250) Cardiac Rhythm: Normal sinus rhythm (07/15 1935) Resp:  [14-20] 20 (07/16 0250) BP: (86-138)/(53-86) 94/62 (07/16 0250) SpO2:  [91 %-99 %] 99 % (07/16 0250) Weight:  [74.7 kg] 74.7 kg (07/16 0250)  Hemodynamic parameters for last 24 hours:    Intake/Output from previous day: 07/15 0701 - 07/16 0700 In: 360 [P.O.:360] Out: 1100 [Urine:1100] Intake/Output this shift: No intake/output data recorded.  General appearance: alert, cooperative, and no distress Heart: regular rate and rhythm Lungs: mildly dim left base Abdomen: benign Extremities: right LE edema /erethema lower and upper thigh are fairly tender to minimal touch  Wound: RLE cellulitis  Lab Results: Recent Labs    05/17/21 0057  WBC 9.3  HGB 9.5*  HCT 29.9*  PLT 302   BMET:  Recent Labs    05/17/21 0057  NA 137  K 3.9  CL 92*  CO2 36*  GLUCOSE 104*  BUN 21  CREATININE 0.96  CALCIUM 8.8*    PT/INR: No results for input(s): LABPROT, INR in the last 72 hours. ABG    Component Value Date/Time   PHART 7.378 05/08/2021 2117   HCO3 23.2 05/08/2021 2117   TCO2 24 05/08/2021 2117   ACIDBASEDEF 2.0 05/08/2021 2117   O2SAT 96.0 05/08/2021 2117   CBG (last 3)  Recent Labs    05/16/21 1106  GLUCAP 153*     Meds Scheduled Meds:  amiodarone  200 mg Oral BID   [START ON 05/26/2021] amiodarone  200 mg Oral Daily   aspirin EC  81 mg Oral Daily   Or   aspirin  81 mg Per Tube Daily   atorvastatin  80 mg Oral Daily   cephALEXin  500 mg Oral Q8H   Chlorhexidine Gluconate Cloth  6 each Topical Daily   digoxin  0.125 mg Oral Daily   feeding supplement  237 mL Oral TID BM   mouth rinse  15 mL Mouth Rinse BID   metoprolol tartrate  25 mg Oral BID   mometasone-formoterol  2 puff Inhalation BID   multivitamin with minerals  1 tablet Oral Q1500   pantoprazole  40 mg Oral Daily   potassium chloride  20 mEq Oral Daily   rivaroxaban  20 mg Oral Q supper   sodium chloride flush  10-40 mL Intracatheter Q12H   sodium chloride flush  3 mL Intravenous Q12H   tamsulosin  0.4 mg Oral q AM   torsemide  40 mg Oral Daily   Continuous Infusions:  lactated ringers     PRN Meds:.dextrose, lactated ringers, lidocaine, loperamide, metoprolol tartrate, ondansetron (ZOFRAN) IV, polyethylene glycol, traMADol  Xrays DG Chest 1 View  Result Date: 05/18/2021 CLINICAL DATA:  Left-sided thoracentesis EXAM: CHEST  1 VIEW COMPARISON:  05/17/2021 FINDINGS: Post CABG changes to  the heart and mediastinum. Left atrial appendage clip. Stable cardiomediastinal contours. Significant interval reduction in previously seen left-sided pleural effusion. No significant residual pleural fluid volume. Improved aeration of the left lung base. Persistent radiation changes within the left mid lung. No pneumothorax. IMPRESSION: Significant interval reduction in previously seen left-sided pleural effusion. No pneumothorax. Electronically Signed   By: Davina Poke D.O.   On: 05/18/2021 11:55   DG Chest 2 View  Result Date: 05/17/2021 CLINICAL DATA:  Patient status post CABG 05/08/2021 EXAM: CHEST - 2 VIEW COMPARISON:  PA and lateral chest 05/13/2021. FINDINGS: Median sternotomy wires are intact and unchanged. Atrial appendage clip is  also unchanged. Small to moderate left pleural effusion and basilar atelectasis have increased. Post radiation change left mid lung appears stable. The right lung is clear. No right effusion. No pneumothorax on the right or left. IMPRESSION: Small to moderate left pleural effusion and basilar atelectasis have increased since the prior exam. The appearance of the chest is otherwise unchanged. Electronically Signed   By: Inge Rise M.D.   On: 05/17/2021 10:48   IR THORACENTESIS ASP PLEURAL SPACE W/IMG GUIDE  Result Date: 05/18/2021 INDICATION: Shortness of breath. Status post recent CABG. Left-sided pleural effusion. Request for therapeutic thoracentesis. EXAM: ULTRASOUND GUIDED LEFT THORACENTESIS MEDICATIONS: 1% plain lidocaine, 5 mL COMPLICATIONS: None immediate. PROCEDURE: An ultrasound guided thoracentesis was thoroughly discussed with the patient and questions answered. The benefits, risks, alternatives and complications were also discussed. The patient understands and wishes to proceed with the procedure. Written consent was obtained. Ultrasound was performed to localize and mark an adequate pocket of fluid in the left chest. The area was then prepped and draped in the normal sterile fashion. 1% Lidocaine was used for local anesthesia. Under ultrasound guidance a 6 Fr Safe-T-Centesis catheter was introduced. Thoracentesis was performed. The catheter was removed and a dressing applied. FINDINGS: A total of approximately 1.1 L of blood-tinged fluid was removed. IMPRESSION: Successful ultrasound guided left thoracentesis yielding 1.1 L of pleural fluid. Read by: Ascencion Dike PA-C Electronically Signed   By: Corrie Mckusick D.O.   On: 05/18/2021 11:58    Assessment/Plan: S/P Procedure(s) (LRB): CORONARY ARTERY BYPASS GRAFTING (CABG)X 3 USING LEFT INTERNAL MAMMARY ARTERY AND RIGHT GREATER SAPEHNOUS VEIN (N/A) INDOCYANINE GREEN FLUORESCENCE IMAGING (ICG) (N/A) TRANSESOPHAGEAL ECHOCARDIOGRAM (TEE)  (N/A) CLIPPING OF ATRIAL APPENDAGE USING 45 ATRICLIP (Left) APPLICATION OF CELL SAVER (N/A) ENDOVEIN HARVEST OF GREATER SAPHENOUS VEIN (Right)  1 afeb, VSS s BP 86-138 range, maintaining Sinus rhythm. Offf midodrine but may need to consider restart, on amio 200 BIDand 25 bid metoprolol- BP has been limiting afib management. Digoxin to be stopped at D/C, on xaralto 2 sats have been ok on RA- 2 liters 3 thoracentesis yesterday 1100 cc, CXR with only small effus on left now, atx is stable 4 good UOP, weight stable 5 cellulitis RLE- I think would benefit from short course of IV ancef at this point to better control, no purulence . Will hold off on Vanco for now, doubt gr- is an issue 6 push rehab and pulm toilet as able    LOS: 11 days    John Giovanni 05/19/2021  Agree with above. On antibiotics for lower extremity cellulitis. Dispo planning.  Leanah Kolander Bary Leriche

## 2021-05-20 LAB — BASIC METABOLIC PANEL
Anion gap: 8 (ref 5–15)
BUN: 17 mg/dL (ref 8–23)
CO2: 31 mmol/L (ref 22–32)
Calcium: 8.9 mg/dL (ref 8.9–10.3)
Chloride: 94 mmol/L — ABNORMAL LOW (ref 98–111)
Creatinine, Ser: 0.88 mg/dL (ref 0.61–1.24)
GFR, Estimated: >60 mL/min (ref >60)
Glucose, Bld: 120 mg/dL — ABNORMAL HIGH (ref 70–99)
Potassium: 4.2 mmol/L (ref 3.5–5.1)
Sodium: 133 mmol/L — ABNORMAL LOW (ref 135–145)

## 2021-05-20 LAB — MAGNESIUM: Magnesium: 2 mg/dL (ref 1.7–2.4)

## 2021-05-20 NOTE — Progress Notes (Addendum)
[address]       [place] [address]             [phone]      12 Days Post-Op Procedure(s) (LRB): CORONARY ARTERY BYPASS GRAFTING (CABG)X 3 USING LEFT INTERNAL MAMMARY ARTERY AND RIGHT GREATER SAPEHNOUS VEIN (N/A) INDOCYANINE GREEN FLUORESCENCE IMAGING (ICG) (N/A) TRANSESOPHAGEAL ECHOCARDIOGRAM (TEE) (N/A) CLIPPING OF ATRIAL APPENDAGE USING 45 ATRICLIP (Left) APPLICATION OF CELL SAVER (N/A) ENDOVEIN HARVEST OF GREATER SAPHENOUS VEIN (Right) Subjective: Feels better overall, some RLE discomfort  Objective: Vital signs in last 24 hours: Temp:  [97.5 F (36.4 C)-98.4 F (36.9 C)] 97.9 F (36.6 C) (07/17 0310) Pulse Rate:  [60-100] 100 (07/17 0310) Cardiac Rhythm: Normal sinus rhythm (07/16 1924) Resp:  [18-20] 20 (07/17 0310) BP: (100-120)/(51-81) 120/81 (07/17 0310) SpO2:  [93 %-97 %] 94 % (07/17 0310) Weight:  [71.8 kg-76.2 kg] 71.8 kg (07/17 0557)  Hemodynamic parameters for last 24 hours:    Intake/Output from previous day: 07/16 0701 - 07/17 0700 In: -  Out: 1675 [Urine:1675] Intake/Output this shift: No intake/output data recorded.  General appearance: alert, cooperative, and no distress Heart: irregularly irregular rhythm Lungs: wheezes scattered throughout Abdomen: benign Extremities: + RLE edema Wound: erethema is improved RLE, less tender  Lab Results: No results for input(s): WBC, HGB, HCT, PLT in the last 72 hours. BMET: No results for input(s): NA, K, CL, CO2, GLUCOSE, BUN, CREATININE, CALCIUM in the last 72 hours.  PT/INR: No results for input(s): LABPROT, INR in the last 72 hours. ABG    Component Value Date/Time   PHART 7.378 05/08/2021 2117   HCO3 23.2 05/08/2021 2117   TCO2 24 05/08/2021 2117   ACIDBASEDEF 2.0 05/08/2021 2117   O2SAT 96.0 05/08/2021 2117   CBG (last 3)  No results for input(s): GLUCAP in the last 72 hours.  Meds Scheduled Meds:  amiodarone  200 mg Oral BID   [START ON 05/26/2021] amiodarone   200 mg Oral Daily   aspirin EC  81 mg Oral Daily   Or   aspirin  81 mg Per Tube Daily   atorvastatin  80 mg Oral Daily   Chlorhexidine Gluconate Cloth  6 each Topical Daily   digoxin  0.125 mg Oral Daily   feeding supplement  237 mL Oral TID BM   mouth rinse  15 mL Mouth Rinse BID   metoprolol tartrate  25 mg Oral BID   mometasone-formoterol  2 puff Inhalation BID   multivitamin with minerals  1 tablet Oral Q1500   pantoprazole  40 mg Oral Daily   potassium chloride  20 mEq Oral Daily   rivaroxaban  20 mg Oral Q supper   sodium chloride flush  10-40 mL Intracatheter Q12H   sodium chloride flush  3 mL Intravenous Q12H   tamsulosin  0.4 mg Oral q AM   torsemide  40 mg Oral Daily   Continuous Infusions:   ceFAZolin (ANCEF) IV 1 g (05/20/21 0306)   lactated ringers     PRN Meds:.dextrose, lactated ringers, lidocaine, loperamide, metoprolol tartrate, ondansetron (ZOFRAN) IV, polyethylene glycol, traMADol  Xrays DG Chest 1 View  Result Date: 05/18/2021 CLINICAL DATA:  Left-sided thoracentesis EXAM: CHEST  1 VIEW COMPARISON:  05/17/2021 FINDINGS: Post CABG changes to the heart and mediastinum. Left atrial appendage clip. Stable cardiomediastinal contours. Significant interval reduction in previously seen left-sided pleural effusion. No significant residual pleural fluid volume. Improved aeration of the left lung base. Persistent radiation changes  within the left mid lung. No pneumothorax. IMPRESSION: Significant interval reduction in previously seen left-sided pleural effusion. No pneumothorax. Electronically Signed   By: Davina Poke D.O.   On: 05/18/2021 11:55   DG Chest 2 View  Result Date: 05/19/2021 CLINICAL DATA:  CABG EXAM: CHEST - 2 VIEW COMPARISON:  Radiograph 05/18/2021 FINDINGS: Sternotomy wires and atrial clip overlies normal cardiac silhouette. Band of scarring in the LEFT upper lobe is unchanged. Mild LEFT basilar atelectasis/effusion is also similar. RIGHT lung clear. No  pneumothorax. IMPRESSION: 1. No interval change. 2. Band like scarring in the LEFT upper lobe. 3. Small LEFT effusion/atelectasis Electronically Signed   By: Suzy Bouchard M.D.   On: 05/19/2021 10:50   IR THORACENTESIS ASP PLEURAL SPACE W/IMG GUIDE  Result Date: 05/18/2021 INDICATION: Shortness of breath. Status post recent CABG. Left-sided pleural effusion. Request for therapeutic thoracentesis. EXAM: ULTRASOUND GUIDED LEFT THORACENTESIS MEDICATIONS: 1% plain lidocaine, 5 mL COMPLICATIONS: None immediate. PROCEDURE: An ultrasound guided thoracentesis was thoroughly discussed with the patient and questions answered. The benefits, risks, alternatives and complications were also discussed. The patient understands and wishes to proceed with the procedure. Written consent was obtained. Ultrasound was performed to localize and mark an adequate pocket of fluid in the left chest. The area was then prepped and draped in the normal sterile fashion. 1% Lidocaine was used for local anesthesia. Under ultrasound guidance a 6 Fr Safe-T-Centesis catheter was introduced. Thoracentesis was performed. The catheter was removed and a dressing applied. FINDINGS: A total of approximately 1.1 L of blood-tinged fluid was removed. IMPRESSION: Successful ultrasound guided left thoracentesis yielding 1.1 L of pleural fluid. Read by: Ascencion Dike PA-C Electronically Signed   By: Corrie Mckusick D.O.   On: 05/18/2021 11:58    Assessment/Plan: S/P Procedure(s) (LRB): CORONARY ARTERY BYPASS GRAFTING (CABG)X 3 USING LEFT INTERNAL MAMMARY ARTERY AND RIGHT GREATER SAPEHNOUS VEIN (N/A) INDOCYANINE GREEN FLUORESCENCE IMAGING (ICG) (N/A) TRANSESOPHAGEAL ECHOCARDIOGRAM (TEE) (N/A) CLIPPING OF ATRIAL APPENDAGE USING 45 ATRICLIP (Left) APPLICATION OF CELL SAVER (N/A) ENDOVEIN HARVEST OF GREATER SAPHENOUS VEIN (Right)   1 Afeb, VSS, sinus rhythm, freq PAC's- cont to monitor with difficult to control afib- cont same meds 2 sats ok on RA,  wheezing this am- cont pulm toilet, inhaler 3 no new labs or XRAY's 4  RLE erethema appears improved , less tender- prob convert back to PO abx tomorrow with potential for D/C 5 cont with cardiac rehab 6 repeat K+ /Mg++     LOS: 12 days    John Giovanni PA-C Pager [phone] 05/20/2021    Agree with above Doing better Dispo planning  Emine Lopata O Burnell Matlin

## 2021-05-21 ENCOUNTER — Other Ambulatory Visit (HOSPITAL_COMMUNITY): Payer: Self-pay

## 2021-05-21 MED ORDER — TORSEMIDE 20 MG PO TABS
20.0000 mg | ORAL_TABLET | Freq: Every day | ORAL | 0 refills | Status: DC
  Filled 2021-05-21: qty 4, 4d supply, fill #0

## 2021-05-21 MED ORDER — METOPROLOL TARTRATE 25 MG PO TABS
25.0000 mg | ORAL_TABLET | Freq: Two times a day (BID) | ORAL | 1 refills | Status: DC
  Filled 2021-05-21: qty 60, 30d supply, fill #0

## 2021-05-21 MED ORDER — TRAMADOL HCL 50 MG PO TABS
50.0000 mg | ORAL_TABLET | Freq: Four times a day (QID) | ORAL | 0 refills | Status: DC | PRN
  Filled 2021-05-21: qty 24, 6d supply, fill #0

## 2021-05-21 MED ORDER — CEPHALEXIN 500 MG PO CAPS
500.0000 mg | ORAL_CAPSULE | Freq: Three times a day (TID) | ORAL | Status: DC
  Administered 2021-05-21: 500 mg via ORAL
  Filled 2021-05-21: qty 1

## 2021-05-21 MED ORDER — AMIODARONE HCL 200 MG PO TABS
200.0000 mg | ORAL_TABLET | Freq: Two times a day (BID) | ORAL | 1 refills | Status: DC
  Filled 2021-05-21: qty 35, 30d supply, fill #0
  Filled 2021-07-05: qty 35, 30d supply, fill #1

## 2021-05-21 MED ORDER — POTASSIUM CHLORIDE CRYS ER 10 MEQ PO TBCR
10.0000 meq | EXTENDED_RELEASE_TABLET | Freq: Every day | ORAL | 0 refills | Status: DC
  Filled 2021-05-21: qty 4, 4d supply, fill #0

## 2021-05-21 MED ORDER — CEPHALEXIN 500 MG PO CAPS
500.0000 mg | ORAL_CAPSULE | Freq: Three times a day (TID) | ORAL | 0 refills | Status: DC
  Filled 2021-05-21: qty 13, 5d supply, fill #0

## 2021-05-21 NOTE — Progress Notes (Signed)
CARDIOLOGY RECOMMENDATIONS:  Discharge is anticipated in the next 48 hours. Recommendations for medications and follow up:  Discharge Medications: Continue medications as they are currently listed in the [facility]. Exceptions to the above: none  Follow Up: The patient's Primary Cardiologist is Mertie Moores, MD  Follow up in the office in 2 week(s). Also has follow up in Afib clinic on 05/28/21. Currently in NSR  Signed,  Ireoluwa Gorsline Martinique, MD  8:58 AM 05/21/2021  [facility]

## 2021-05-21 NOTE — Care Management Important Message (Signed)
Important Message  Patient Details  Name: Robert Bates MRN: [id number] Date of Birth: Sep 14, 1941   Medicare Important Message Given:  Yes     Shelda Altes 05/21/2021, 10:16 AM

## 2021-05-21 NOTE — Progress Notes (Signed)
1300-1330    checked with pt when wife to be here.  Stated 1200. Checked back [phone] for wife to educate. Returned and education completed with pt and wife who voiced understanding. Encouraged smoking cessation. Pt stated he has quit. Discussed staying in the tube,sternal precautions, IS and walking guidelines and CRP 2. Has diabetic and heart healthy diets. Referral letter to be sent to [facility]. Graylon Good RN BSN 05/21/2021 1:39 PM
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
## 2021-05-21 NOTE — TOC Transition Note (Signed)
Transition of Care (TOC) - CM/SW Discharge Note Marvetta Gibbons RN, BSN Transitions of Care Unit 4E- RN Case Manager See Treatment Team for direct phone #    Patient Details  Name: Tullio Chausse MRN: [id number] Date of Birth: Mar 21, 1941  Transition of Care [facility]) CM/SW Contact:  Dawayne Patricia, RN Phone Number: 05/21/2021, 11:41 AM   Clinical Narrative:    Pt stable for transition home today, HH has been arranged with Jackquline [facility]- notified Lattie Haw at [facility] of pt's discharge today. No further TOC needs noted.  Pt to return home with wife.      Final next level of care: [place] Barriers to Discharge: Barriers Resolved   Patient Goals and CMS Choice Patient states their goals for this hospitalization and ongoing recovery are:: return home with family CMS Medicare.gov Compare Post Acute Care list provided to:: Patient Choice offered to / list presented to : Patient, Spouse  Discharge Placement                 Home w/ [facility]      Discharge Plan and Services   Discharge Planning Services: CM Consult Post Acute Care Choice: Home Health          DME Arranged: N/A DME Agency: NA       HH Arranged: PT, OT   Date HH Agency Contacted: 05/15/21 Time [place]: 0093    Social Determinants of Health (SDOH) Interventions     Readmission Risk Interventions Readmission Risk Prevention Plan 05/15/2021  Transportation Screening Complete  PCP or Specialist Appt within 5-7 Days Complete  Home Care Screening Complete  Medication Review (RN CM) Complete  Some recent data might be hidden

## 2021-05-21 NOTE — Progress Notes (Signed)
[id number] Came to see to walk. Wants to finish his nap. Emptied urinals for pt to use. Will see later and walk if not for d/c and ed if for d/c. Graylon Good RN BSN 05/21/2021 8:31 AM

## 2021-05-21 NOTE — Progress Notes (Signed)
Mobility Specialist: Progress Note   05/21/21 1128  Mobility  Activity Ambulated in hall;Ambulated to bathroom  Level of Assistance Minimal assist, patient does 75% or more  Assistive Device Front wheel walker  Distance Ambulated (ft) 470 ft  Mobility Ambulated with assistance in hallway  Mobility Response Tolerated well  $Mobility charge 1 Mobility   Pre-Mobility: 111 HR, 98% SpO2 During Mobility: 126 HR, 96% SpO2 Post-Mobility: 121 HR, 93/66 BP, 97% SpO2  Pt requesting to use BR before ambulation, BM successful. Pt c/o pain in RLE during ambulation, no rating given. Pt otherwise asx. Pt with slow, antalgic gait today. Pt is sitting EOB with call bell and phone in reach.   [facility] Jailah Willis Mobility Specialist Mobility Specialist Phone: [phone]

## 2021-05-21 NOTE — Progress Notes (Signed)
Pt being D/C, VSS, Education provided, Cardiac rehab went over d/c instructions again, IV removed, Chest tube sutures removed and tolerated well, telebox returned.   Chrisandra Carota, RN 05/21/2021 1:13 PM

## 2021-05-23 ENCOUNTER — Other Ambulatory Visit: Payer: Self-pay | Admitting: Cardiothoracic Surgery

## 2021-05-23 DIAGNOSIS — C3410 Malignant neoplasm of upper lobe, unspecified bronchus or lung: Secondary | ICD-10-CM

## 2021-05-23 DIAGNOSIS — Z951 Presence of aortocoronary bypass graft: Secondary | ICD-10-CM

## 2021-05-24 ENCOUNTER — Ambulatory Visit: Payer: Self-pay | Admitting: Cardiothoracic Surgery

## 2021-05-24 ENCOUNTER — Ambulatory Visit
Admission: RE | Admit: 2021-05-24 | Discharge: 2021-05-24 | Disposition: A | Discharge status: Home / Self Care | Payer: Medicare Other | Source: Ambulatory Visit | Attending: Cardiothoracic Surgery | Admitting: Cardiothoracic Surgery

## 2021-05-24 ENCOUNTER — Ambulatory Visit (INDEPENDENT_AMBULATORY_CARE_PROVIDER_SITE_OTHER): Payer: Self-pay | Admitting: Cardiothoracic Surgery

## 2021-05-24 ENCOUNTER — Other Ambulatory Visit: Payer: Self-pay

## 2021-05-24 VITALS — BP 116/67 | HR 68 | Resp 20 | Ht 69.0 in | Wt 159.0 lb

## 2021-05-24 DIAGNOSIS — I2510 Atherosclerotic heart disease of native coronary artery without angina pectoris: Secondary | ICD-10-CM

## 2021-05-24 DIAGNOSIS — Z951 Presence of aortocoronary bypass graft: Secondary | ICD-10-CM

## 2021-05-24 DIAGNOSIS — R0602 Shortness of breath: Secondary | ICD-10-CM | POA: Diagnosis not present

## 2021-05-24 NOTE — Progress Notes (Signed)
[address]       [place] [address]             [phone]     CARDIOTHORACIC SURGERY OFFICE NOTE  Referring Provider is Nahser, Wonda Cheng, MD Primary Cardiologist is Mertie Moores, MD PCP is [name], NP   HPI:  80 yo man s/p CABG 15 days ago. Had pre-op and post-op afib. Now back in NSR with good rate control. Feeling well since d/c home.    Current Outpatient Medications  Medication Sig Dispense Refill   acetaminophen (TYLENOL) 500 MG tablet Take 1-2 tablets (500-1,000 mg total) by mouth every 6 (six) hours as needed. 30 tablet 0   albuterol (VENTOLIN HFA) 108 (90 Base) MCG/ACT inhaler Inhale 2 puffs into the lungs every 6 (six) hours as needed for wheezing or shortness of breath. 8.5 g 5   amiodarone (PACERONE) 200 MG tablet Take 1 tablet (200 mg total) by mouth 2 (two) times daily for 5 days, then take tablet (200 mg) once daily thereafter 35 tablet 1   aspirin EC 81 MG tablet Take 1 tablet (81 mg total) by mouth in the morning. Swallow whole.     atorvastatin (LIPITOR) 80 MG tablet Take 1 tablet (80 mg total) by mouth daily. 90 tablet 1   cephALEXin (KEFLEX) 500 MG capsule Take 1 capsule (500 mg total) by mouth every 8 (eight) hours. 13 capsule 0   metoprolol tartrate (LOPRESSOR) 25 MG tablet Take 1 tablet (25 mg total) by mouth 2 (two) times daily. 60 tablet 1   mometasone-formoterol (DULERA) 200-5 MCG/ACT AERO Inhale 2 puffs into the lungs 2 (two) times daily.     Multiple Vitamin (MULTIVITAMIN WITH MINERALS) TABS tablet Take 1 tablet by mouth daily in the afternoon.     omeprazole (PRILOSEC) 40 MG capsule Take 1 capsule (40 mg total) by mouth daily. 30 capsule 5   potassium chloride (KLOR-CON) 10 MEQ tablet Take 1 tablet (10 mEq total) by mouth daily for 4 days then stop. 4 tablet 0   rivaroxaban (XARELTO) 20 MG TABS tablet TAKE 1 TABLET BY MOUTH DAILY WITH SUPPER 30 tablet 5   tamsulosin (FLOMAX) 0.4 MG CAPS capsule TAKE 1 CAPSULE (0.4  MG TOTAL) BY MOUTH DAILY. 30 capsule 3   torsemide (DEMADEX) 20 MG tablet Take 1 tablet (20 mg total) by mouth daily for 4 days then stop. 4 tablet 0   traMADol (ULTRAM) 50 MG tablet Take 1 tablet (50 mg total) by mouth every 6 (six) hours as needed for moderate pain. 24 tablet 0   No current facility-administered medications for this visit.      Physical Exam:   BP 116/67   Pulse 68   Resp 20   Ht 5\' 9"  (1.753 m)   Wt 72.1 kg   SpO2 96% Comment: RA  BMI 23.48 kg/m   General:  Elderly,NAD  Chest:   cta  CV:   rrr  Incisions:  Healing well; mild redness at most inferior aspect of sternal incision  Abdomen:  sntnd  Extremities:  Mild RLE edema  Diagnostic Tests:  CXR with small left effusion   Impression:  Doing well after CABG  Plan:  F/u 2 weeks with repeat CXR Sternal precautions No change to meds  I spent in excess of 15 minutes during the conduct of this office consultation and >50% of this time involved direct face-to-face encounter with the patient for counseling and/or coordination of their  care.  Level 2                 10 minutes Level 3                 15 minutes Level 4                 25 minutes Level 5                 40 minutes  B. Murvin Natal, MD 05/24/2021 4:02 PM

## 2021-05-25 ENCOUNTER — Telehealth (HOSPITAL_COMMUNITY): Payer: Self-pay

## 2021-05-25 NOTE — Telephone Encounter (Signed)
Per phase I cardiac rehab, fax cardiac rehab referral to [facility] cardiac rehab.

## 2021-05-28 ENCOUNTER — Encounter (HOSPITAL_COMMUNITY): Payer: Self-pay | Admitting: Physician Assistant

## 2021-05-28 ENCOUNTER — Ambulatory Visit (HOSPITAL_COMMUNITY)
Admit: 2021-05-28 | Discharge: 2021-05-28 | Disposition: A | Discharge status: Home / Self Care | Payer: Medicare Other | Source: Ambulatory Visit | Attending: Physician Assistant | Admitting: Physician Assistant

## 2021-05-28 ENCOUNTER — Other Ambulatory Visit: Payer: Self-pay

## 2021-05-28 VITALS — BP 108/72 | HR 52 | Ht 69.0 in | Wt 162.6 lb

## 2021-05-28 DIAGNOSIS — D6869 Other thrombophilia: Secondary | ICD-10-CM | POA: Insufficient documentation

## 2021-05-28 DIAGNOSIS — I2510 Atherosclerotic heart disease of native coronary artery without angina pectoris: Secondary | ICD-10-CM | POA: Diagnosis not present

## 2021-05-28 DIAGNOSIS — Z7901 Long term (current) use of anticoagulants: Secondary | ICD-10-CM | POA: Diagnosis not present

## 2021-05-28 DIAGNOSIS — Z87891 Personal history of nicotine dependence: Secondary | ICD-10-CM | POA: Diagnosis not present

## 2021-05-28 DIAGNOSIS — Z951 Presence of aortocoronary bypass graft: Secondary | ICD-10-CM | POA: Diagnosis not present

## 2021-05-28 DIAGNOSIS — Z7982 Long term (current) use of aspirin: Secondary | ICD-10-CM | POA: Diagnosis not present

## 2021-05-28 DIAGNOSIS — I4819 Other persistent atrial fibrillation: Secondary | ICD-10-CM | POA: Diagnosis not present

## 2021-05-28 DIAGNOSIS — R001 Bradycardia, unspecified: Secondary | ICD-10-CM | POA: Insufficient documentation

## 2021-05-28 MED ORDER — METOPROLOL TARTRATE 25 MG PO TABS: 12.5000 mg | ORAL_TABLET | Freq: Two times a day (BID) | ORAL | 1 refills | Status: DC

## 2021-05-28 NOTE — Progress Notes (Signed)
Primary Care Physician: [name], NP Primary Cardiologist: Dr Acie Fredrickson Primary Electrophysiologist: none Referring Physician: Dr Orvan Seen    Robert Bates is a 80 y.o. male with a history of tobacco use with COPD hx of lung CA, PVD, HLD, chronic diastolic HF, CAD, atrial fibrillation who presents for consultation in the [facility]. Patient is on Xarelto for a CHADS2VASC score of 3. He was admitted 05/08/21 for CABG and developed postop afib with RVR. He does have a history of afib prior to surgery. He was loaded on amiodarone which eventually did convert him to SR. He was difficulty to rate control due to hypotension. Patient denies any heart racing since leaving the hospital. He does note some lightheadedness on standing.   Today, he denies symptoms of palpitations, shortness of breath, orthopnea, PND, lower extremity edema, dizziness, presyncope, syncope, snoring, daytime somnolence, bleeding, or neurologic sequela. The patient is tolerating medications without difficulties and is otherwise without complaint today.    Atrial Fibrillation Risk Factors:  he does not have symptoms or diagnosis of sleep apnea. he does not have a history of rheumatic fever.   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   COPD (chronic obstructive pulmonary disease)  (HCC)    Coronary artery disease    Diabetes type 2, controlled ([place]) 07/31/2017   Dyspnea    Dysrhythmia    a fib   GERD (gastroesophageal reflux disease)    Hematuria    refuses work up or referral - understands risks of morbidity / mortality - 11/2008, 12/2008   History of hiatal hernia    History of kidney stones    Hyperlipemia    Meningioma ([place]) 10/25/2013   Follows with Dr. [name].    Peripheral vascular disease ([place])    Abdominal Aortic Aneursym   Pneumonia    as a child   Radiation 09/18/15-10/25/15   left lower lobe 70.2 Gy   Seizures ([place]) 02/18/2020   Tobacco abuse    Past Surgical History:  Procedure Laterality Date   CHOLECYSTECTOMY N/A 07/23/2017   Procedure: LAPAROSCOPIC CHOLECYSTECTOMY;  Surgeon: Mickeal Skinner, MD;  Location: [facility];  Service: General;  Laterality: N/A;   CLIPPING OF ATRIAL APPENDAGE Left 05/08/2021   Procedure: CLIPPING OF ATRIAL APPENDAGE USING 28 ATRICLIP;  Surgeon: Wonda Olds, MD;  Location: [place];  Service: Open Heart Surgery;  Laterality: Left;   COLONOSCOPY     CORONARY ARTERY BYPASS GRAFT N/A 05/08/2021   Procedure: CORONARY ARTERY BYPASS GRAFTING (CABG)X 3 USING LEFT INTERNAL MAMMARY ARTERY AND RIGHT GREATER SAPEHNOUS VEIN;  Surgeon: Wonda Olds, MD;  Location: [place];  Service: Open Heart Surgery;  Laterality: N/A;   ENDOVEIN HARVEST OF GREATER SAPHENOUS VEIN Right 05/08/2021   Procedure: ENDOVEIN HARVEST OF GREATER SAPHENOUS VEIN;  Surgeon: Wonda Olds, MD;  Location: [place];  Service: Open Heart Surgery;  Laterality: Right;   EYE SURGERY Bilateral    Cataracts removed w/ lens implant   HERNIA REPAIR     Left 36 years ago . Right inguinal hernia repair 10-01-17 Dr. Kieth Brightly   INGUINAL HERNIA REPAIR Right 10/01/2017   Procedure: RIGHT INGUINAL HERNIA REPAIR WITH MESH;  Surgeon: Kinsinger, Arta Bruce, MD;  Location: [facility];  Service: General;  Laterality: Right;  TAP BLOCK   INSERTION OF MESH Right 10/01/2017    Procedure: INSERTION OF MESH;  Surgeon: Kieth Brightly Arta Bruce, MD;  Location: [facility];  Service: General;  Laterality: Right;   IR THORACENTESIS ASP PLEURAL SPACE W/IMG GUIDE  05/18/2021   LEFT HEART CATH AND CORONARY ANGIOGRAPHY N/A 04/20/2021   Procedure: LEFT HEART CATH AND CORONARY ANGIOGRAPHY;  Surgeon: Wellington Hampshire, MD;  Location: [facility];  Service: Cardiovascular;  Laterality: N/A;   TEE WITHOUT CARDIOVERSION N/A 05/08/2021   Procedure: TRANSESOPHAGEAL ECHOCARDIOGRAM (TEE);  Surgeon: Wonda Olds, MD;  Location: [place];  Service: Open Heart Surgery;  Laterality: N/A;   TONSILLECTOMY     TONSILLECTOMY     VIDEO BRONCHOSCOPY Bilateral 07/26/2015   Procedure: VIDEO BRONCHOSCOPY WITH FLUORO;  Surgeon: [name], MD;  [facility];  Service: Cardiopulmonary;  Laterality: Bilateral;   VIDEO BRONCHOSCOPY WITH ENDOBRONCHIAL NAVIGATION N/A 08/23/2015   Procedure: VIDEO BRONCHOSCOPY WITH ENDOBRONCHIAL NAVIGATION;  Surgeon: Grace Isaac, MD;  Location: [facility];  Service: Thoracic;  Laterality: N/A;   VIDEO BRONCHOSCOPY WITH ENDOBRONCHIAL ULTRASOUND N/A 08/23/2015   Procedure: VIDEO BRONCHOSCOPY WITH ENDOBRONCHIAL ULTRASOUND;  Surgeon: Grace Isaac, MD;  Location: [facility];  Service: Thoracic;  Laterality: N/A;    Current Outpatient Medications  Medication Sig Dispense Refill   acetaminophen (TYLENOL) 500 MG tablet Take 1-2 tablets (500-1,000 mg total) by mouth every 6 (six) hours as needed. 30 tablet 0   albuterol (VENTOLIN HFA) 108 (90 Base) MCG/ACT inhaler Inhale 2 puffs into the lungs every 6 (six) hours as needed for wheezing or shortness of breath. 8.5 g 5   amiodarone (PACERONE) 200 MG tablet Take 1 tablet (200 mg total) by mouth 2 (two) times daily for 5 days, then take tablet (200 mg) once daily thereafter 35 tablet 1   aspirin EC 81 MG tablet Take 1 tablet (81 mg total) by mouth in the morning. Swallow whole.     atorvastatin (LIPITOR) 80 MG tablet Take  1 tablet (80 mg total) by mouth daily. 90 tablet 1   metoprolol tartrate (LOPRESSOR) 25 MG tablet Take 1 tablet (25 mg total) by mouth 2 (two) times daily. 60 tablet 1   mometasone-formoterol (DULERA) 200-5 MCG/ACT AERO Inhale 2 puffs into the lungs 2 (two) times daily.     Multiple Vitamin (MULTIVITAMIN WITH MINERALS) TABS tablet Take 1 tablet by mouth daily in the afternoon.     omeprazole (PRILOSEC) 40 MG capsule Take 1 capsule (40 mg total) by mouth daily. 30 capsule 5   rivaroxaban (XARELTO) 20 MG TABS tablet TAKE 1 TABLET BY MOUTH DAILY WITH SUPPER 30 tablet 5   tamsulosin (FLOMAX) 0.4 MG CAPS capsule TAKE 1 CAPSULE (0.4 MG TOTAL) BY MOUTH DAILY. 30 capsule 3   traMADol (ULTRAM) 50 MG tablet Take 1 tablet (50 mg total) by mouth every 6 (six) hours as needed for moderate pain. 24 tablet 0   No current facility-administered medications for this encounter.    Allergies  Allergen Reactions   Iodine Other (See Comments)    neck swells   Iohexol Swelling    Neck and gland swelling per patient.    Social History   Socioeconomic History   Marital status: Married    Spouse name: Not on file   Number of children: 2   Years of education: Not on file   Highest education level: Not on file  Occupational History   Occupation: Retired    Fish farm manager: [employer]    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[place] Use   Smoking status: Former    Packs/day: 1.00    Years: 57.00    Pack years: 57.00    Types: Cigarettes, Cigars    Quit date: 08/08/2015    Years since quitting: 5.8   Smokeless tobacco: Former    Types: Chew    Quit date: 11/04/1958   Tobacco comments:    occasional use sneaks around  Vaping Use   Vaping Use: Former  Substance and Sexual Activity   Alcohol use: Not Currently    Alcohol/week: 0.0 standard drinks   Drug use: No   Sexual activity: Yes  Other Topics Concern   Not on file  Social History Narrative   Not on file   Social Determinants of  Health   Financial Resource Strain: Medium Risk   Difficulty of Paying Living Expenses: Somewhat hard  Food Insecurity: Not on file  Transportation Needs: Not on file  Physical Activity: Inactive   Days of Exercise per Week: 0 days   Minutes of Exercise per Session: 0 min  Stress: Not on file  Social Connections: Not on file  Intimate Partner Violence: Not on file     ROS- All systems are reviewed and negative except as per the HPI above.  Physical Exam: Vitals:   05/28/21 1341  BP: 108/72  Pulse: (!) 52  Weight: 73.8 kg  Height: 5\' 9"  (1.753 m)    GEN- The patient is a well appearing elderly male, alert and oriented x 3 today.   Head- normocephalic, atraumatic Eyes-  Sclera clear, conjunctiva pink Ears- hearing intact Oropharynx- clear Neck- supple  Lungs- Clear to ausculation bilaterally, normal work of breathing Heart- Regular rate and rhythm, bradycardia, no murmurs, rubs or gallops  GI- soft, NT, ND, + BS Extremities- no clubbing, cyanosis, or edema MS- no significant deformity or atrophy Skin- no rash or lesion Psych- euthymic mood, full affect Neuro- strength and sensation are intact  Wt Readings from Last 3 Encounters:  05/28/21 73.8 kg  05/24/21 72.1 kg  05/20/21 71.8 kg    EKG today demonstrates  SB Vent. rate 52 BPM PR interval 134 ms QRS duration 102 ms QT/QTcB 466/433 ms  Echo 04/22/21 demonstrated  1. Left ventricular ejection fraction, by estimation, is 60 to 65%. The  left ventricle has normal function. The left ventricle has no regional  wall motion abnormalities. Left ventricular diastolic parameters are  consistent with Grade II diastolic  dysfunction (pseudonormalization). Elevated left ventricular end-diastolic  pressure. The E/e' is 46.   2. Right ventricular systolic function is mildly reduced. The right  ventricular size is normal. There is mildly elevated pulmonary artery  systolic pressure. The estimated right ventricular  systolic pressure is  50.5 mmHg.   3. The mitral valve is grossly normal. Trivial mitral valve  regurgitation.   4. The aortic valve is tricuspid. Aortic valve regurgitation is not  visualized.   5. Aortic dilatation noted. There is  mild dilatation of the ascending  aorta, measuring 40 mm.   6. The inferior vena cava is dilated in size with <50% respiratory  variability, suggesting right atrial pressure of 15 mmHg.   Epic records are reviewed at length today  CHA2DS2-VASc Score = 3  The patient's score is based upon: CHF History: No HTN History: No Diabetes History: No Stroke History: No Vascular Disease History: Yes Age Score: 2 Gender Score: 0      ASSESSMENT AND PLAN: 1. Persistent Atrial Fibrillation (ICD10:  I48.19) The patient's CHA2DS2-VASc score is 3, indicating a 3.2% annual risk of stroke. Patient appears to be maintaining SR.  Continue amiodarone 200 mg daily Decrease Lopressor to 12.5 mg BID given bradycardia. Continue Xarelto 20 mg daily  2. Secondary Hypercoagulable State (ICD10:  D68.69) The patient is at significant risk for stroke/thromboembolism based upon his CHA2DS2-VASc Score of 3.  Continue Rivaroxaban (Xarelto).   3. CAD S/p CABG No anginal symptoms.   Follow up with Richardson Dopp as scheduled.    [facility] [address] [place], [place] [address] [phone] 05/28/2021 1:48 PM

## 2021-05-30 ENCOUNTER — Ambulatory Visit (HOSPITAL_COMMUNITY): Payer: Medicare Other | Admitting: Physician Assistant

## 2021-06-01 ENCOUNTER — Other Ambulatory Visit: Payer: Self-pay | Admitting: Cardiothoracic Surgery

## 2021-06-01 DIAGNOSIS — C3490 Malignant neoplasm of unspecified part of unspecified bronchus or lung: Secondary | ICD-10-CM

## 2021-06-04 ENCOUNTER — Other Ambulatory Visit: Payer: Self-pay | Admitting: *Deleted

## 2021-06-04 ENCOUNTER — Ambulatory Visit
Admission: RE | Admit: 2021-06-04 | Discharge: 2021-06-04 | Disposition: A | Discharge status: Home / Self Care | Payer: Medicare Other | Source: Ambulatory Visit | Attending: Cardiothoracic Surgery | Admitting: Cardiothoracic Surgery

## 2021-06-04 ENCOUNTER — Other Ambulatory Visit: Payer: Self-pay

## 2021-06-04 ENCOUNTER — Ambulatory Visit (INDEPENDENT_AMBULATORY_CARE_PROVIDER_SITE_OTHER): Payer: Self-pay | Admitting: Physician Assistant

## 2021-06-04 ENCOUNTER — Other Ambulatory Visit (HOSPITAL_BASED_OUTPATIENT_CLINIC_OR_DEPARTMENT_OTHER): Payer: Self-pay

## 2021-06-04 VITALS — BP 93/58 | HR 61 | Resp 20 | Wt 164.0 lb

## 2021-06-04 DIAGNOSIS — C3490 Malignant neoplasm of unspecified part of unspecified bronchus or lung: Secondary | ICD-10-CM

## 2021-06-04 DIAGNOSIS — Z951 Presence of aortocoronary bypass graft: Secondary | ICD-10-CM

## 2021-06-04 DIAGNOSIS — R0602 Shortness of breath: Secondary | ICD-10-CM | POA: Diagnosis not present

## 2021-06-04 MED FILL — Rivaroxaban Tab 20 MG: ORAL | 30 days supply | Qty: 30 | Fill #1 | Status: AC

## 2021-06-04 NOTE — Progress Notes (Signed)
Cardiology Office Note:    Date:  06/05/2021   ID:  Robert Bates, DOB 02-20-1941, MRN [id number]  PCP:  Robert Alar, NP   [facility] Providers Cardiologist:  Robert Moores, MD      Referring MD: Robert Alar, NP   Chief Complaint:  Hospitalization Follow-up (S/p CABG)    Patient Profile:    Robert Bates is a 80 y.o. male with:  Coronary artery disease S/p CABG 05/2021 Paroxysmal atrial fibrillation  S/p L atral clipping 05/2021 (HFpEF) heart failure with preserved ejection fraction  Lung CA COPD Abdominal aortic aneurysm  Hypertension  Hyperlipidemia  Borderline diabetes mellitus  Peripheral arterial disease  Prior ETOH abuse  S/p cholecystectomy  Aortic atherosclerosis  Thoracic aortic aneurysm CT 6/22: 3.9 cm   Prior CV studies: Pre-CABG [place] 04/25/21 Bilateral ICA 1-39  Echocardiogram 04/22/21 EF 60-65, no RWMA, GRII DD, mildly reduced RVSF, RVSP 41, trivial MR, mild dilation of the ascending aorta (40 mm)  LEFT HEART CATH AND CORONARY ANGIOGRAPHY 04/20/2021 Narrative  Mid LM to Dist LM lesion is 70% stenosed.  Ost Cx to Prox Cx lesion is 70% stenosed.  Mid Cx lesion is 90% stenosed.  3rd Mrg lesion is 100% stenosed.  Ost LAD to Prox LAD lesion is 80% stenosed.  1st Diag lesion is 70% stenosed.  Ramus lesion is 60% stenosed. 1.  Significant heavily calcified distal left main disease with significant calcified disease affecting the proximal LAD, large first diagonal, ostial ramus and left circumflex.  Occluded OM 3 with collaterals.  The coronary arteries are codominant with no obstructive disease affecting the right coronary artery. 2.  Normal LV systolic function and left ventricular end-diastolic pressure.  Recommendations: The patient was in sinus rhythm on presentation but went into A. fib with RVR during catheterization.  He was given 5 mg of IV metoprolol but was still tachycardic.  Due to this and given his significant coronary  artery disease, recommend admission to the hospital. Start heparin drip 6 hours after sheath pull. Recommend rate control for atrial fibrillation.  I started oral metoprolol and if rate is still not controlled, diltiazem drip or amiodarone drip can be considered. I requested an echocardiogram. I will consult CT surgery for CABG evaluation.  CT CORONARY MORPH W/CTA COR W/SCORE W/CA W/CM &/OR WO/CM 04/10/2021 IMPRESSION: 1. Coronary calcium score of 1231. This was 76th percentile for age and sex matched control. 2.  Normal coronary origin with right dominance. 3. Severe atherosclerosis involving the moderate disease of the mid to distal LM and severe atherosclerosis in the LAD and LCx. CAD RADS 4b. 4. Consider symptom-guided anti-ischemic and preventive pharmacotherapy as well as risk factor modification per guideline-directed care. 5.  Recommend cardiac catheterization. 6.  This study has been submitted for FFR flow analysis.  IMPRESSION: 1. Stable large hiatal hernia. 2. Stable top-normal ascending thoracic aorta measuring approximately 3.9 cm in greatest diameter. 3. Stable soft tissue thickening in the left perihilar lung presumably related to prior treatment of lung carcinoma. 4. Stable focal sclerosis within the T8 vertebral body presumably representing a sclerotic bone island.   Echo 07/22/17 EF 55-60, no RWMA, Gr 2 DD, mild RAE, PASP 38  History of Present Illness: [name] was recently admitted 7/5-7/18 for CABG with Dr. Orvan Bates (LIMA-LAD, SVG-OM, SVG-DX; L atrial clipping).  Postop course was notable for blood loss anemia requiring transfusion with PRBCs.  He also developed a fibrillation with rapid ventricular rate.  He was followed by cardiology and started on amiodarone.  He eventually converted back to sinus rhythm prior to discharge.  He also required left thoracentesis due to pleural effusion.   He had follow-up in atrial fibrillation clinic 05/28/2021.  He was  maintaining sinus rhythm at that time.  He was bradycardic and his metoprolol tartrate dose was reduced.  Amiodarone 20 mg daily was continued as well as his rivaroxaban.  He returns for follow-up.  He was Bates by Robert Rough, PA-C with [facility] yesterday.  He has a recurrent pleural effusion on the L and is scheduled for a thoracentesis on Thursday.  He has been more short of breath of the last couple of days.  He has been sleeping on an incline.  His legs are more swollen.  His weights have been fairly stable at home.  He is up about 6 pounds on our scales.  His chest remains sore.  His appetite has been good.  He has been having normal bowel movements.  His wife notes that he is not walking regularly.  He has not yet heard from cardiac rehab in [place].    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   COPD (chronic obstructive pulmonary disease) (HCC)    Coronary artery disease    Diabetes type 2, controlled ([place]) 07/31/2017   Dyspnea    Dysrhythmia    a fib   GERD (gastroesophageal reflux disease)    Hematuria    refuses work up or referral - understands risks of morbidity / mortality - 11/2008, 12/2008   History of hiatal hernia    History of kidney stones    Hyperlipemia    Meningioma ([place]) 10/25/2013   Follows with Dr. Ashok Bates.    Peripheral vascular disease ([place])    Abdominal Aortic Aneursym   Pneumonia    as a child   Radiation 09/18/15-10/25/15   left lower lobe 70.2 Gy   Seizures ([place]) 02/18/2020   Tobacco abuse     Current Medications: Current Meds  Medication Sig   acetaminophen (TYLENOL) 500 MG tablet Take 1-2 tablets (500-1,000 mg total) by mouth every 6 (six) hours as needed.   albuterol (VENTOLIN HFA) 108 (90 Base) MCG/ACT inhaler Inhale 2 puffs into the lungs every 6 (six) hours as needed for wheezing or shortness  of breath.   amiodarone (PACERONE) 200 MG tablet Take 1 tablet (200 mg total) by mouth 2 (two) times daily for 5 days, then take tablet (200 mg) once daily thereafter   aspirin EC 81 MG tablet Take 1 tablet (81 mg total) by mouth in the morning. Swallow whole.   atorvastatin (LIPITOR) 80 MG tablet Take 1 tablet (80 mg total) by mouth daily.   furosemide (LASIX) 40 MG tablet Take 1 tablet (40 mg total) by mouth as needed for edema. Take with Potassium tablet.   mometasone-formoterol (DULERA) 200-5 MCG/ACT AERO Inhale 2 puffs into the lungs 2 (two) times daily.   Multiple Vitamin (MULTIVITAMIN WITH MINERALS) TABS tablet Take 1 tablet by mouth daily in the afternoon.   omeprazole (PRILOSEC) 40 MG capsule Take 1 capsule (40 mg total) by mouth daily.   potassium chloride SA (KLOR-CON) 20 MEQ tablet Take 1 tablet (20 mEq total) by mouth as needed (edema.  To be taken with lasix tablet.). Take with Lasix tablet.   rivaroxaban (XARELTO) 20 MG TABS  tablet TAKE 1 TABLET BY MOUTH DAILY WITH SUPPER   tamsulosin (FLOMAX) 0.4 MG CAPS capsule TAKE 1 CAPSULE (0.4 MG TOTAL) BY MOUTH DAILY.   traMADol (ULTRAM) 50 MG tablet Take 1 tablet (50 mg total) by mouth every 6 (six) hours as needed for moderate pain.   [DISCONTINUED] metoprolol tartrate (LOPRESSOR) 25 MG tablet Take 0.5 tablets (12.5 mg total) by mouth 2 (two) times daily.     Allergies:   Iodine and Iohexol   Social History   Tobacco Use   Smoking status: Former    Packs/day: 1.00    Years: 57.00    Pack years: 57.00    Types: Cigarettes, Cigars    Quit date: 08/08/2015    Years since quitting: 5.8   Smokeless tobacco: Former    Types: Chew    Quit date: 11/04/1958   Tobacco comments:    occasional use sneaks around  Vaping Use   Vaping Use: Former  Substance Use Topics   Alcohol use: Not Currently    Alcohol/week: 0.0 standard drinks   Drug use: No     Family Hx: The patient's family history includes Emphysema in his father; Learning  disabilities in his son; Leukemia in his father and another family member; Stroke in an other family member.  Review of Systems  Gastrointestinal:  Negative for hematochezia.  Genitourinary:  Negative for hematuria.    EKGs/Labs/Other Test Reviewed:    EKG:  EKG is   ordered today.  The ekg ordered today demonstrates sinus bradycardia, HR 56, low voltage, left axis deviation, QTC 413, nonspecific ST-T wave changes  Recent Labs: 05/04/2021: ALT 13 05/17/2021: Hemoglobin 9.5; Platelets 302 05/20/2021: Magnesium 2.0 06/05/2021: BUN 18; Creatinine, Ser 0.86; Potassium 4.8; Sodium 143   Recent Lipid Panel Lab Results  Component Value Date/Time   CHOL 146 04/11/2021 02:21 PM   TRIG 119 04/11/2021 02:21 PM   HDL 63 04/11/2021 02:21 PM   LDLCALC 62 04/11/2021 02:21 PM   LDLDIRECT 142.2 12/26/2008 10:32 AM      Risk Assessment/Calculations:    CHA2DS2-VASc Score = 3  This indicates a 3.2% annual risk of stroke. The patient's score is based upon: CHF History: No HTN History: No Diabetes History: No Stroke History: No Vascular Disease History: Yes Age Score: 2 Gender Score: 0     Physical Exam:    VS:  BP 120/60   Pulse (!) 56   Ht 5\' 9"  (1.753 m)   Wt 165 lb (74.8 kg)   SpO2 96%   BMI 24.37 kg/m     Wt Readings from Last 3 Encounters:  06/05/21 165 lb (74.8 kg)  06/04/21 164 lb (74.4 kg)  05/28/21 162 lb 9.6 oz (73.8 kg)     Constitutional:      Appearance: Healthy appearance. Not in distress.  Neck:     Vascular: JVD normal.  Pulmonary:     Breath sounds: No rales.     Comments: Decreased breath sounds bilaterally - worse on the L Cardiovascular:     Normal rate. Regular rhythm. Normal S1. Normal S2.      Murmurs: There is no murmur.  Edema:    Pretibial: bilateral 2+ edema of the pretibial area. Abdominal:     Palpations: Abdomen is soft.  Skin:    General: Skin is warm and dry.  Neurological:     General: No focal deficit present.     Mental Status:  Alert and oriented to person, place and time.  Cranial Nerves: Cranial nerves are intact.         ASSESSMENT & PLAN:    1. Coronary artery disease involving native coronary artery of native heart without angina pectoris Status post CABG.  He seems to be progressing well.  He does have recurrent left pleural effusion.  Cardiothoracic surgery has planned thoracentesis later this week and they will see him back again next week.  I have encouraged him to increase his walking.  I have also asked him to contact us if he does not hear from cardiac rehabilitation in [place] in the next couple of weeks.  Continue aspirin, statin.  We can likely discontinue his aspirin once he is 3 months out from his surgery as he is on rivaroxaban.  He is bradycardic and does report some lightheadedness at times.  Therefore, I am going to stop his metoprolol. Follow-up in 6 weeks with Dr. Acie Fredrickson or me.   2. Persistent atrial fibrillation (HCC) Maintaining sinus rhythm.  He is status post left atrial clipping.  He is also continued on amiodarone.  I will make sure he has follow-up TSH and LFTs obtained in 6 weeks.  At this point, I anticipate he will remain on amiodarone.  He did have PFTs done in 2018.  We discussed the importance of annual eye exams.  3.  Acute on chronic heart failure with preserved ejection fraction ([place]) He is somewhat volume overloaded.  He also has a left pleural effusion.  As noted, plan is to proceed with thoracentesis later this week.  I will place him on furosemide 40 mg daily for 7 days along with potassium 20 mEq daily.  Obtain BMET today and again in 1 week.  I have asked him to weigh daily.  He can take Lasix and potassium as needed in the future if his weight increases by more than 3 pounds in a day or if he has worsening leg edema.  4. Essential hypertension His blood pressure has been somewhat low.  He does note some lightheadedness.  His blood pressure is optimal today.  However, I  will stop his metoprolol as outlined above.  5. Mixed hyperlipidemia Continue high intensity statin therapy.  Obtain a CMET, lipids in 6 weeks.  6. Thoracic aortic aneurysm without rupture (HCC) 3.9 cm by CT in 6/22.  Consider follow-up CT in 1 year.  7.  Left pleural effusion As noted, thoracentesis is planned later this week.  He has follow-up with cardiothoracic surgery next week.         Dispo:  Return in about 6 weeks (around 07/17/2021) for Routine 6 week follow up with [facility]. .   Medication Adjustments/Labs and Tests Ordered: Current medicines are reviewed at length with the patient today.  Concerns regarding medicines are outlined above.  Tests Ordered: Orders Placed This Encounter  Procedures   Basic Metabolic Panel (BMET)   Basic Metabolic Panel (BMET)   EKG 12-Lead   Medication Changes: Meds ordered this encounter  Medications   furosemide (LASIX) 40 MG tablet    Sig: Take 1 tablet (40 mg total) by mouth as needed for edema. Take with Potassium tablet.    Dispense:  30 tablet    Refill:  2   potassium chloride SA (KLOR-CON) 20 MEQ tablet    Sig: Take 1 tablet (20 mEq total) by mouth as needed (edema.  To be taken with lasix tablet.). Take with Lasix tablet.    Dispense:  30 tablet    Refill:  2    Signed, Richardson Dopp, PA-C  06/05/2021 5:48 PM    [facility] [address], [place], [place]  [address] Phone: [phone]; Fax: [phone]

## 2021-06-04 NOTE — Progress Notes (Addendum)
[address]       [place] [address]             [phone]        Seaver Machia is a 80 y.o. male patient status post coronary artery bypass grafting x4 by Dr. Julien Girt on 05/08/2021.  While in the hospital he had a left thoracentesis on 05/18/2021 for a left pleural effusion.  He also was treated for his COPD with [facility] and albuterol nebs.  He had some atrial bigeminy in the hospital and was converted on amiodarone IV and digoxin.  He was started on some midodrine for hypotension.  Dr. Julien Girt saw the patient on 721 and on his chest x-ray that showed a small left pleural effusion.  Today, we are following up on his chest x-ray.   No diagnosis found. 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   COPD (chronic obstructive pulmonary disease) (HCC)    Coronary artery disease    Diabetes type 2, controlled ([place]) 07/31/2017   Dyspnea    Dysrhythmia    a fib   GERD (gastroesophageal reflux disease)    Hematuria    refuses work up or referral - understands risks of morbidity / mortality - 11/2008, 12/2008   History of hiatal hernia    History of kidney stones    Hyperlipemia    Meningioma ([place]) 10/25/2013   Follows with Dr. [name].    Peripheral vascular disease ([place])    Abdominal Aortic Aneursym   Pneumonia    as a child   Radiation 09/18/15-10/25/15   left lower lobe 70.2 Gy   Seizures ([place]) 02/18/2020   Tobacco abuse    No past surgical history pertinent negatives on file. Scheduled Meds: Current Outpatient Medications on File Prior to Visit  Medication Sig Dispense Refill   acetaminophen (TYLENOL) 500 MG tablet Take 1-2 tablets (500-1,000 mg total) by mouth every 6 (six) hours as needed. 30 tablet 0   albuterol (VENTOLIN HFA) 108 (90 Base) MCG/ACT inhaler Inhale 2 puffs into the lungs every 6 (six) hours  as needed for wheezing or shortness of breath. 8.5 g 5   amiodarone (PACERONE) 200 MG tablet Take 1 tablet (200 mg total) by mouth 2 (two) times daily for 5 days, then take tablet (200 mg) once daily thereafter 35 tablet 1   aspirin EC 81 MG tablet Take 1 tablet (81 mg total) by mouth in the morning. Swallow whole.     atorvastatin (LIPITOR) 80 MG tablet Take 1 tablet (80 mg total) by mouth daily. 90 tablet 1   metoprolol tartrate (LOPRESSOR) 25 MG tablet Take 0.5 tablets (12.5 mg total) by mouth 2 (two) times daily. 60 tablet 1   mometasone-formoterol (DULERA) 200-5 MCG/ACT AERO Inhale 2 puffs into the lungs 2 (two) times daily.     Multiple Vitamin (MULTIVITAMIN WITH MINERALS) TABS tablet Take 1 tablet by mouth daily in the afternoon.     omeprazole (PRILOSEC) 40 MG capsule Take 1 capsule (40 mg total) by mouth daily. 30 capsule 5   rivaroxaban (XARELTO) 20 MG TABS tablet TAKE 1 TABLET BY MOUTH DAILY WITH SUPPER 30 tablet 5   tamsulosin (FLOMAX) 0.4 MG CAPS capsule TAKE 1 CAPSULE (0.4 MG TOTAL) BY MOUTH DAILY. 30 capsule 3  traMADol (ULTRAM) 50 MG tablet Take 1 tablet (50 mg total) by mouth every 6 (six) hours as needed for moderate pain. 24 tablet 0   [DISCONTINUED] metFORMIN (GLUCOPHAGE) 500 MG tablet Take 1 tablet (500 mg total) by mouth 2 (two) times daily with a meal. 60 tablet 0   No current facility-administered medications on file prior to visit.     Allergies  Allergen Reactions   Iodine Other (See Comments)    neck swells   Iohexol Swelling    Neck and gland swelling per patient.   Active Problems:   * No active hospital problems. *  Blood pressure (!) 93/58, pulse 61, resp. rate 20, weight 164 lb (74.4 kg), SpO2 94 %.  CLINICAL DATA:  Lung cancer.  Shortness of breath.   EXAM: CHEST - 2 VIEW   COMPARISON:  05/24/2021   FINDINGS: The left base collapse/consolidative opacity with pleural effusion is similar although the volume pleural fluid appears progressive in the  interval. There is soft tissue fullness in the left parahilar region, similar to prior with extension to the peripheral pleura. Right lung remains clear. The cardio pericardial silhouette is enlarged. Probable hiatal hernia. The visualized bony structures of the thorax show no acute abnormality.   IMPRESSION: 1. Soft tissue fullness in the left parahilar region, similar to prior. 2. Left base collapse/consolidative opacity with mild progression of left pleural effusion.     Electronically Signed   By: Misty Stanley M.D.   On: 06/04/2021 12:51      Cor: RRR. No murmur Pulm: CTA bilaterally and in all fields Abd: soft, nontender Wound: clean and dry Ext: 1+ pitting edema bilaterally  Subjective Objective: Vital signs (most recent): Blood pressure (!) 93/58, pulse 61, resp. rate 20, weight 164 lb (74.4 kg), SpO2 94 %. Assessment & Plan  Mr. Drudge is a 80 year old male patient who underwent a CABG x4 by Dr. Orvan Seen.  He also required a left thoracentesis on 05/18/2021 while in the hospital.  Today he presents for follow-up with his left pleural effusion.  In reviewing his chest x-ray does appear that his left pleural effusion has gotten larger.  We are unable to prescribe any diuretics due to his blood pressure today.  He was on midodrine at one point for his blood pressure helped but has since been taken off of it.  He does not state that he is dizzy or lightheaded.  He did lose his balance once but did not fall.  Since his pleural effusion has gotten larger since his last x-ray I do feel that an ultrasound-guided thoracentesis is indicated at this time.  We will plan for this to be done this week and follow-up next week with a x-ray.  He might ultimately need to be put on some diuretics once his blood pressure is a little bit better to prevent this problem in the future.  Recommend follow-up in 1 week with a chest x-ray.  Ultrasound-guided thoracentesis ordered today to be done this  week.  Elgie Collard 06/04/2021

## 2021-06-05 ENCOUNTER — Other Ambulatory Visit (HOSPITAL_BASED_OUTPATIENT_CLINIC_OR_DEPARTMENT_OTHER): Payer: Self-pay

## 2021-06-05 ENCOUNTER — Ambulatory Visit: Payer: Medicare Other | Admitting: Physician Assistant

## 2021-06-05 ENCOUNTER — Encounter: Payer: Self-pay | Admitting: Physician Assistant

## 2021-06-05 VITALS — BP 120/60 | HR 56 | Ht 69.0 in | Wt 165.0 lb

## 2021-06-05 DIAGNOSIS — E782 Mixed hyperlipidemia: Secondary | ICD-10-CM | POA: Diagnosis not present

## 2021-06-05 DIAGNOSIS — I712 Thoracic aortic aneurysm, without rupture, unspecified: Secondary | ICD-10-CM

## 2021-06-05 DIAGNOSIS — I5032 Chronic diastolic (congestive) heart failure: Secondary | ICD-10-CM

## 2021-06-05 DIAGNOSIS — I4819 Other persistent atrial fibrillation: Secondary | ICD-10-CM

## 2021-06-05 DIAGNOSIS — J449 Chronic obstructive pulmonary disease, unspecified: Secondary | ICD-10-CM

## 2021-06-05 DIAGNOSIS — I10 Essential (primary) hypertension: Secondary | ICD-10-CM

## 2021-06-05 DIAGNOSIS — I2510 Atherosclerotic heart disease of native coronary artery without angina pectoris: Secondary | ICD-10-CM

## 2021-06-05 DIAGNOSIS — I5033 Acute on chronic diastolic (congestive) heart failure: Secondary | ICD-10-CM | POA: Diagnosis not present

## 2021-06-05 DIAGNOSIS — J90 Pleural effusion, not elsewhere classified: Secondary | ICD-10-CM | POA: Diagnosis not present

## 2021-06-05 LAB — BASIC METABOLIC PANEL
BUN/Creatinine Ratio: 21 (ref 10–24)
BUN: 18 mg/dL (ref 8–27)
CO2: 24 mmol/L (ref 20–29)
Calcium: 9.1 mg/dL (ref 8.6–10.2)
Chloride: 104 mmol/L (ref 96–106)
Creatinine, Ser: 0.86 mg/dL (ref 0.76–1.27)
Glucose: 83 mg/dL (ref 65–99)
Potassium: 4.8 mmol/L (ref 3.5–5.2)
Sodium: 143 mmol/L (ref 134–144)
eGFR: 88 mL/min/{1.73_m2} (ref >59)

## 2021-06-05 MED ORDER — POTASSIUM CHLORIDE CRYS ER 20 MEQ PO TBCR
20.0000 meq | EXTENDED_RELEASE_TABLET | ORAL | 2 refills | Status: DC | PRN
  Filled 2021-06-05: qty 30, 30d supply, fill #0

## 2021-06-05 MED ORDER — FUROSEMIDE 40 MG PO TABS
40.0000 mg | ORAL_TABLET | ORAL | 2 refills | Status: DC | PRN
  Filled 2021-06-05: qty 30, 30d supply, fill #0

## 2021-06-05 NOTE — Patient Instructions (Signed)
Medication Instructions:   DECREASE Metoprolol one half tablet by mouth ( 12.5 mg) daily X 3 days than discontinue.  START Lasix one tablet by mouth( 40 mg) daily X 7 days than take as needed for weight gain of 3 lbs in one day or swelling.   START Kdur one tablet by mouth ( 20 mEq)  daily X 7 days than take as needed with lasix.    *If you need a refill on your cardiac medications before your next appointment, please call your pharmacy*   Lab Work: TODAY!!!!!!  BMET  Your physician recommends that you return for lab work at [facility] in 1 week with paperwork..   If you have labs (blood work) drawn today and your tests are completely normal, you will receive your results only by: [place] (if you have MyChart) OR A paper copy in the mail If you have any lab test that is abnormal or we need to change your treatment, we will call you to review the results.   Testing/Procedures:  -NONE   Follow-Up: At [facility], you and your health needs are our priority.  As part of our continuing mission to provide you with exceptional heart care, we have created designated Provider Care Teams.  These Care Teams include your primary Cardiologist (physician) and Advanced Practice Providers (APPs -  Physician Assistants and Nurse Practitioners) who all work together to provide you with the care you need, when you need it.  We recommend signing up for the patient portal called "MyChart".  Sign up information is provided on this After Visit Summary.  MyChart is used to connect with patients for Virtual Visits (Telemedicine).  Patients are able to view lab/test results, encounter notes, upcoming appointments, etc.  Non-urgent messages can be sent to your provider as well.   To learn more about what you can do with MyChart, go to [email or web address].    Your next appointment:   6 week(s) on Friday, September 16 @ 11:45 fasting from Midnight the night before appointment.   The format for  your next appointment:   In Person  Provider:   Richardson Dopp, PA-C   Other Instructions  Please call office @ [phone] or send mychart message if you do not hear from [facility] in two weeks.    Please weigh daily and call office or send mychart message in you gain 3 lbs in 1 day.

## 2021-06-07 ENCOUNTER — Other Ambulatory Visit: Payer: Self-pay

## 2021-06-07 ENCOUNTER — Ambulatory Visit (HOSPITAL_COMMUNITY)
Admission: RE | Admit: 2021-06-07 | Discharge: 2021-06-07 | Disposition: A | Discharge status: Home / Self Care | Payer: Medicare Other | Source: Ambulatory Visit | Attending: Cardiothoracic Surgery | Admitting: Cardiothoracic Surgery

## 2021-06-07 ENCOUNTER — Other Ambulatory Visit: Payer: Self-pay | Admitting: Cardiothoracic Surgery

## 2021-06-07 DIAGNOSIS — Z951 Presence of aortocoronary bypass graft: Secondary | ICD-10-CM | POA: Diagnosis not present

## 2021-06-07 DIAGNOSIS — K449 Diaphragmatic hernia without obstruction or gangrene: Secondary | ICD-10-CM | POA: Diagnosis not present

## 2021-06-07 DIAGNOSIS — C3490 Malignant neoplasm of unspecified part of unspecified bronchus or lung: Secondary | ICD-10-CM | POA: Diagnosis not present

## 2021-06-07 DIAGNOSIS — J90 Pleural effusion, not elsewhere classified: Secondary | ICD-10-CM | POA: Insufficient documentation

## 2021-06-07 HISTORY — PX: IR THORACENTESIS ASP PLEURAL SPACE W/IMG GUIDE: IMG5380

## 2021-06-07 MED ORDER — LIDOCAINE HCL 1 % IJ SOLN
INTRAMUSCULAR | Status: AC
  Filled 2021-06-07: qty 20

## 2021-06-07 MED ORDER — LIDOCAINE HCL (PF) 1 % IJ SOLN
INTRAMUSCULAR | Status: AC | PRN
  Administered 2021-06-07: 10 mL

## 2021-06-07 NOTE — Procedures (Signed)
PROCEDURE SUMMARY:  Successful US guided left thoracentesis. Yielded 1.2 of amber fluid. Patient tolerated procedure well. No immediate complications. EBL = trace  Post procedure chest X-ray pending.  Amely Voorheis S Cedrica Brune PA-C 06/07/2021 10:21 AM

## 2021-06-11 ENCOUNTER — Other Ambulatory Visit: Payer: Self-pay | Admitting: Cardiothoracic Surgery

## 2021-06-11 ENCOUNTER — Other Ambulatory Visit (HOSPITAL_BASED_OUTPATIENT_CLINIC_OR_DEPARTMENT_OTHER): Payer: Self-pay

## 2021-06-11 ENCOUNTER — Other Ambulatory Visit: Payer: Self-pay

## 2021-06-11 ENCOUNTER — Encounter: Payer: Self-pay | Admitting: Physician Assistant

## 2021-06-11 ENCOUNTER — Ambulatory Visit (INDEPENDENT_AMBULATORY_CARE_PROVIDER_SITE_OTHER): Payer: Self-pay | Admitting: Physician Assistant

## 2021-06-11 ENCOUNTER — Ambulatory Visit
Admission: RE | Admit: 2021-06-11 | Discharge: 2021-06-11 | Disposition: A | Discharge status: Home / Self Care | Payer: Medicare Other | Source: Ambulatory Visit | Attending: Cardiothoracic Surgery | Admitting: Cardiothoracic Surgery

## 2021-06-11 VITALS — BP 115/73 | HR 78 | Resp 20 | Ht 69.0 in | Wt 158.0 lb

## 2021-06-11 DIAGNOSIS — Z951 Presence of aortocoronary bypass graft: Secondary | ICD-10-CM

## 2021-06-11 DIAGNOSIS — R079 Chest pain, unspecified: Secondary | ICD-10-CM | POA: Diagnosis not present

## 2021-06-11 DIAGNOSIS — I4891 Unspecified atrial fibrillation: Secondary | ICD-10-CM

## 2021-06-11 DIAGNOSIS — J90 Pleural effusion, not elsewhere classified: Secondary | ICD-10-CM

## 2021-06-11 MED ORDER — TRAMADOL HCL 50 MG PO TABS
50.0000 mg | ORAL_TABLET | Freq: Four times a day (QID) | ORAL | 0 refills | Status: DC | PRN
  Filled 2021-06-11: qty 20, 5d supply, fill #0

## 2021-06-11 NOTE — Patient Instructions (Signed)
-  Continue with Lasix 40 mg p.o. daily and K-Dur 20 mEq p.o. daily  -May resume driving when not taking tramadol  -Return to the office in 2 weeks for repeat chest x-ray  -Continue to observe sternal precautions

## 2021-06-11 NOTE — Progress Notes (Addendum)
HPI:  Robert Bates is a 80 y.o. male patient status post coronary artery bypass grafting x4 by Dr. Julien Girt on 05/08/2021 for multivessel coronary artery disease presenting with normal left ventricular ejection fraction and mildly reduced right ventricular systolic function.  While in the hospital he had a left thoracentesis on 05/18/2021 for a left pleural effusion.  He also was treated for his COPD with [facility] and albuterol nebs.  He has a history of paroxysmal atrial fibrillation and had some atrial bigeminy in the hospital and was converted on amiodarone IV and digoxin.  He was started on some midodrine for hypotension.  Dr. Julien Girt saw the patient on 7/21 and on his chest x-ray that showed a small left pleural effusion.   He was seen in the office about 10 days later and was noted to have progression of the left pleural effusion.  He had ultrasound-guided left thoracentesis on 06/07/2021 yielding 1.2 L of clear amber fluid.  His follow-up chest x-ray showed considerable improvement in the expansion of his left lung and no pneumothorax.  He returns today for scheduled follow-up.  Robert Bates reports he is continue to progress overall.  He denies shortness of breath but says he continues to have some wheezing from time to time.  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  He is gradually advancing his activity.  Current Outpatient Medications  Medication Sig Dispense Refill   acetaminophen (TYLENOL) 500 MG tablet Take 1-2 tablets (500-1,000 mg total) by mouth every 6 (six) hours as needed. 30 tablet 0   albuterol (VENTOLIN HFA) 108 (90 Base) MCG/ACT inhaler Inhale 2 puffs into the lungs every 6 (six) hours as needed for wheezing or shortness of breath. 8.5 g 5   amiodarone (PACERONE) 200 MG tablet Take 1 tablet (200 mg total) by mouth 2 (two) times daily for 5 days, then take tablet (200 mg) once daily  thereafter 35 tablet 1   aspirin EC 81 MG tablet Take 1 tablet (81 mg total) by mouth in the morning. Swallow whole.     atorvastatin (LIPITOR) 80 MG tablet Take 1 tablet (80 mg total) by mouth daily. 90 tablet 1   furosemide (LASIX) 40 MG tablet Take 1 tablet (40 mg total) by mouth as needed for edema. Take with Potassium tablet. 30 tablet 2   mometasone-formoterol (DULERA) 200-5 MCG/ACT AERO Inhale 2 puffs into the lungs 2 (two) times daily.     Multiple Vitamin (MULTIVITAMIN WITH MINERALS) TABS tablet Take 1 tablet by mouth daily in the afternoon.     omeprazole (PRILOSEC) 40 MG capsule Take 1 capsule (40 mg total) by mouth daily. 30 capsule 5   potassium chloride SA (KLOR-CON) 20 MEQ tablet Take 1 tablet (20 mEq total) by mouth as needed (edema.  To be taken with lasix tablet.). Take with Lasix tablet. 30 tablet 2   rivaroxaban (XARELTO) 20 MG TABS tablet TAKE 1 TABLET BY MOUTH DAILY WITH SUPPER 30 tablet 5   tamsulosin (FLOMAX) 0.4 MG CAPS capsule TAKE 1 CAPSULE (0.4 MG TOTAL) BY MOUTH DAILY. 30 capsule 3   traMADol (ULTRAM) 50 MG tablet Take 1 tablet (50 mg total) by mouth every 6 (six) hours as needed for moderate pain. 24 tablet 0   No current facility-administered medications for this visit.    Physical Exam  BP-115/73 Pulse 78 Respirations 20 O2 sat 96%  General-Robert Bates  walked in to the office today and appears stable with his mobility. Heart-regular rate and rhythm, no murmur. Chest-breath sounds are diminished in the left base.  There is mild wheezing noted. Extremities-mild, 1+ edema in the right lower extremity following saphenous vein harvest.  There is no edema in his left leg. Wounds-the sternotomy incision is healing well.  A monofilament suture tag was protruding through the midpoint of the incision.  This was with sterile suture scissors.  Sternum is stable.  The right lower extremity EVH incisions have some superficial eschar  Diagnostic Tests:  CLINICAL DATA:   CABG in July.  Chest pain.  History of lung cancer.   EXAM: CHEST - 2 VIEW   COMPARISON:  06/07/2021 and CT chest 04/20/2021.   FINDINGS: Trachea is midline. Heart size normal. Left atrial appendage clip is in place. Post treatment scarring in the left perihilar region with overall volume loss in the left hemithorax. Right lung is clear. Small left pleural effusion. No pneumothorax. Trace right pleural effusion.   IMPRESSION: 1. Small bilateral pleural effusions, left greater than right. 2. Post treatment scarring in the left perihilar region.     Electronically Signed   By: Lorin Picket M.D.   On: 06/11/2021 14:47   Impression / Plan:  -Robert Bates is about 5 weeks status post coronary bypass grafting after presenting with vessel coronary disease and normal ejection fraction.  He is progressing well overall.  He will need to continue with his aspirin and atorvastatin.  He was not started on beta-blocker due to hypotension postoperatively.   -Left pleural effusion, recurrent-- he had left thoracentesis while in the hospital and this was repeated last week yielding about 1200 mL of amber fluid.  On today's chest x-ray there is again early re-accumulation of the left effusion as compared to the last postthoracentesis image on 8/4.  I asked Robert Bates to continue with his Lasix 40 mg p.o. daily and potassium 20 mEq p.o. daily and we will plan to have him return to the office in 2 weeks for follow-up chest x-ray.  If the left pleural effusion has re-accumulated, we will need to consider repeat thoracentesis and will need to send pleural fluid sample for cytology given his history of stage IIb non-small cell carcinoma of the lung treated with radiation in 2016.   May also need to consider Pleurx catheter placement should the effusion reaccumulate.  -Postoperative atrial fibrillation--he remains in sinus rhythm on amiodarone 200 mg p.o. daily.  I explained that we will continue this for at  least another month and consider stopping it if he remains in sinus rhythm.  Antony Odea, PA-C [facility] [phone]

## 2021-06-11 NOTE — Addendum Note (Signed)
Addended by: Antony Odea on: 06/11/2021 03:20 PM   Modules accepted: Orders

## 2021-06-12 ENCOUNTER — Other Ambulatory Visit (HOSPITAL_BASED_OUTPATIENT_CLINIC_OR_DEPARTMENT_OTHER): Payer: Self-pay

## 2021-06-12 NOTE — Progress Notes (Signed)
Pt has been made aware of normal result and verbalized understanding.  jw

## 2021-06-14 ENCOUNTER — Inpatient Hospital Stay: Payer: Medicare Other | Attending: Internal Medicine

## 2021-06-14 ENCOUNTER — Other Ambulatory Visit: Payer: Self-pay

## 2021-06-14 ENCOUNTER — Ambulatory Visit (HOSPITAL_COMMUNITY)
Admission: RE | Admit: 2021-06-14 | Discharge: 2021-06-14 | Disposition: A | Discharge status: Home / Self Care | Payer: Medicare Other | Source: Ambulatory Visit | Attending: Internal Medicine | Admitting: Internal Medicine

## 2021-06-14 DIAGNOSIS — Z85118 Personal history of other malignant neoplasm of bronchus and lung: Secondary | ICD-10-CM | POA: Diagnosis not present

## 2021-06-14 DIAGNOSIS — J439 Emphysema, unspecified: Secondary | ICD-10-CM | POA: Diagnosis not present

## 2021-06-14 DIAGNOSIS — C3490 Malignant neoplasm of unspecified part of unspecified bronchus or lung: Secondary | ICD-10-CM | POA: Diagnosis not present

## 2021-06-14 DIAGNOSIS — R911 Solitary pulmonary nodule: Secondary | ICD-10-CM | POA: Diagnosis not present

## 2021-06-14 DIAGNOSIS — I700 Atherosclerosis of aorta: Secondary | ICD-10-CM | POA: Diagnosis not present

## 2021-06-14 DIAGNOSIS — J90 Pleural effusion, not elsewhere classified: Secondary | ICD-10-CM | POA: Diagnosis not present

## 2021-06-14 LAB — CBC WITH DIFFERENTIAL (CANCER CENTER ONLY)
Abs Immature Granulocytes: 0.01 10*3/uL (ref 0.00–0.07)
Basophils Absolute: 0 10*3/uL (ref 0.0–0.1)
Basophils Relative: 0 %
Eosinophils Absolute: 0.1 10*3/uL (ref 0.0–0.5)
Eosinophils Relative: 2 %
HCT: 32.4 % — ABNORMAL LOW (ref 39.0–52.0)
Hemoglobin: 10.4 g/dL — ABNORMAL LOW (ref 13.0–17.0)
Immature Granulocytes: 0 %
Lymphocytes Relative: 18 %
Lymphs Abs: 1.2 10*3/uL (ref 0.7–4.0)
MCH: 30.2 pg (ref 26.0–34.0)
MCHC: 32.1 g/dL (ref 30.0–36.0)
MCV: 94.2 fL (ref 80.0–100.0)
Monocytes Absolute: 0.6 10*3/uL (ref 0.1–1.0)
Monocytes Relative: 9 %
Neutro Abs: 4.8 10*3/uL (ref 1.7–7.7)
Neutrophils Relative %: 71 %
Platelet Count: 247 10*3/uL (ref 150–400)
RBC: 3.44 MIL/uL — ABNORMAL LOW (ref 4.22–5.81)
RDW: 14.9 % (ref 11.5–15.5)
WBC Count: 6.8 10*3/uL (ref 4.0–10.5)
nRBC: 0 % (ref 0.0–0.2)

## 2021-06-14 LAB — CMP (CANCER CENTER ONLY)
ALT: 13 U/L (ref 0–44)
AST: 11 U/L — ABNORMAL LOW (ref 15–41)
Albumin: 2.8 g/dL — ABNORMAL LOW (ref 3.5–5.0)
Alkaline Phosphatase: 103 U/L (ref 38–126)
Anion gap: 9 (ref 5–15)
BUN: 19 mg/dL (ref 8–23)
CO2: 25 mmol/L (ref 22–32)
Calcium: 8.8 mg/dL — ABNORMAL LOW (ref 8.9–10.3)
Chloride: 106 mmol/L (ref 98–111)
Creatinine: 0.86 mg/dL (ref 0.61–1.24)
GFR, Estimated: >60 mL/min (ref >60)
Glucose, Bld: 96 mg/dL (ref 70–99)
Potassium: 4 mmol/L (ref 3.5–5.1)
Sodium: 140 mmol/L (ref 135–145)
Total Bilirubin: 0.4 mg/dL (ref 0.3–1.2)
Total Protein: 5.9 g/dL — ABNORMAL LOW (ref 6.5–8.1)

## 2021-06-16 DIAGNOSIS — H43813 Vitreous degeneration, bilateral: Secondary | ICD-10-CM | POA: Diagnosis not present

## 2021-06-16 LAB — HM DIABETES EYE EXAM

## 2021-06-18 ENCOUNTER — Inpatient Hospital Stay: Payer: Medicare Other

## 2021-06-20 ENCOUNTER — Inpatient Hospital Stay (HOSPITAL_BASED_OUTPATIENT_CLINIC_OR_DEPARTMENT_OTHER): Payer: Medicare Other | Admitting: Internal Medicine

## 2021-06-20 ENCOUNTER — Encounter: Payer: Self-pay | Admitting: Internal Medicine

## 2021-06-20 ENCOUNTER — Other Ambulatory Visit: Payer: Self-pay

## 2021-06-20 VITALS — BP 105/82 | HR 72 | Temp 98.0°F | Resp 20 | Ht 69.0 in | Wt 161.6 lb

## 2021-06-20 DIAGNOSIS — C3490 Malignant neoplasm of unspecified part of unspecified bronchus or lung: Secondary | ICD-10-CM

## 2021-06-20 DIAGNOSIS — Z85118 Personal history of other malignant neoplasm of bronchus and lung: Secondary | ICD-10-CM | POA: Diagnosis not present

## 2021-06-20 NOTE — Progress Notes (Signed)
[place] Telephone:([phone]) [phone]   Fax:([phone]  OFFICE PROGRESS NOTE  [name], [place] [id number]  DIAGNOSIS: Stage IIB (T3, N0, M0) non-small cell lung cancer, squamous cell carcinoma presented with left lower lobe endobronchial lesion as well as suspicious groundglass opacity in the left upper lobe diagnosed in September 2016.  PRIOR THERAPY: Curative radiotherapy to the left upper lobe lung mass under the care of Dr. Sondra Come completed 10/25/2015.  CURRENT THERAPY: Observation  INTERVAL HISTORY: Robert Bates 80 y.o. male returns to the clinic today for follow-up visit.  The patient is feeling fine today with no concerning complaints.  He underwent CABG x3 vessels on May 04, 2021 under the care of Dr. Orvan Seen.  He is recovering slowly from his surgery.  He has occasional chest pain and shortness of breath with exertion but no cough or hemoptysis.  He denied having any fever or chills.  He has no nausea, vomiting, diarrhea or constipation.  He denied having any headache or visual changes.  He has no fever or chills.  He is here today for evaluation with repeat CT scan of the chest for restaging of his disease.  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   COPD (chronic obstructive pulmonary disease) (HCC)    Coronary artery disease    Diabetes type 2, controlled ([place]) 07/31/2017   Dyspnea    Dysrhythmia    a fib   GERD (gastroesophageal reflux disease)    Hematuria    refuses work up or referral - understands risks of morbidity / mortality - 11/2008, 12/2008   History of hiatal hernia    History of kidney stones    Hyperlipemia    Meningioma ([place]) 10/25/2013   Follows with Dr. [name].    Peripheral vascular disease ([place])    Abdominal  Aortic Aneursym   Pneumonia    as a child   Radiation 09/18/15-10/25/15   left lower lobe 70.2 Gy   Seizures ([place]) 02/18/2020   Tobacco abuse     ALLERGIES:  is allergic to iodine and iohexol.  MEDICATIONS:  Current Outpatient Medications  Medication Sig Dispense Refill   acetaminophen (TYLENOL) 500 MG tablet Take 1-2 tablets (500-1,000 mg total) by mouth every 6 (six) hours as needed. 30 tablet 0   albuterol (VENTOLIN HFA) 108 (90 Base) MCG/ACT inhaler Inhale 2 puffs into the lungs every 6 (six) hours as needed for wheezing or shortness of breath. 8.5 g 5   amiodarone (PACERONE) 200 MG tablet Take 1 tablet (200 mg total) by mouth 2 (two) times daily for 5 days, then take tablet (200 mg) once daily thereafter 35 tablet 1   aspirin EC 81 MG tablet Take 1 tablet (81 mg total) by mouth in the morning. Swallow whole.     atorvastatin (LIPITOR) 80 MG tablet Take 1 tablet (80 mg total) by mouth daily. 90 tablet 1   furosemide (LASIX) 40 MG tablet Take 1 tablet (40 mg total) by mouth as needed for edema. Take with Potassium tablet. 30 tablet 2   mometasone-formoterol (DULERA) 200-5 MCG/ACT AERO Inhale 2 puffs into the lungs 2 (two) times daily.     Multiple Vitamin (MULTIVITAMIN WITH MINERALS) TABS tablet Take 1 tablet by  mouth daily in the afternoon.     omeprazole (PRILOSEC) 40 MG capsule Take 1 capsule (40 mg total) by mouth daily. 30 capsule 5   potassium chloride SA (KLOR-CON) 20 MEQ tablet Take 1 tablet (20 mEq total) by mouth as needed (edema.  To be taken with lasix tablet.). Take with Lasix tablet. 30 tablet 2   rivaroxaban (XARELTO) 20 MG TABS tablet TAKE 1 TABLET BY MOUTH DAILY WITH SUPPER 30 tablet 5   tamsulosin (FLOMAX) 0.4 MG CAPS capsule TAKE 1 CAPSULE (0.4 MG TOTAL) BY MOUTH DAILY. 30 capsule 3   traMADol (ULTRAM) 50 MG tablet Take 1 tablet (50 mg total) by mouth every 6 (six) hours as needed for moderate pain. 20 tablet 0   No current facility-administered medications for this  visit.    SURGICAL HISTORY:  Past Surgical History:  Procedure Laterality Date   CHOLECYSTECTOMY N/A 07/23/2017   Procedure: LAPAROSCOPIC CHOLECYSTECTOMY;  Surgeon: Kinsinger, Arta Bruce, MD;  Location: [facility];  Service: General;  Laterality: N/A;   CLIPPING OF ATRIAL APPENDAGE Left 05/08/2021   Procedure: CLIPPING OF ATRIAL APPENDAGE USING 33 ATRICLIP;  Surgeon: Wonda Olds, MD;  Location: [place];  Service: Open Heart Surgery;  Laterality: Left;   COLONOSCOPY     CORONARY ARTERY BYPASS GRAFT N/A 05/08/2021   Procedure: CORONARY ARTERY BYPASS GRAFTING (CABG)X 3 USING LEFT INTERNAL MAMMARY ARTERY AND RIGHT GREATER SAPEHNOUS VEIN;  Surgeon: Wonda Olds, MD;  Location: [place];  Service: Open Heart Surgery;  Laterality: N/A;   ENDOVEIN HARVEST OF GREATER SAPHENOUS VEIN Right 05/08/2021   Procedure: ENDOVEIN HARVEST OF GREATER SAPHENOUS VEIN;  Surgeon: Wonda Olds, MD;  Location: [place];  Service: Open Heart Surgery;  Laterality: Right;   EYE SURGERY Bilateral    Cataracts removed w/ lens implant   HERNIA REPAIR     Left 36 years ago . Right inguinal hernia repair 10-01-17 Dr. Kieth Brightly   INGUINAL HERNIA REPAIR Right 10/01/2017   Procedure: RIGHT INGUINAL HERNIA REPAIR WITH MESH;  Surgeon: Kinsinger, Arta Bruce, MD;  Location: [facility];  Service: General;  Laterality: Right;  TAP BLOCK   INSERTION OF MESH Right 10/01/2017   Procedure: INSERTION OF MESH;  Surgeon: Kieth Brightly Arta Bruce, MD;  Location: [facility];  Service: General;  Laterality: Right;   IR THORACENTESIS ASP PLEURAL SPACE W/IMG GUIDE  05/18/2021   IR THORACENTESIS ASP PLEURAL SPACE W/IMG GUIDE  06/07/2021   LEFT HEART CATH AND CORONARY ANGIOGRAPHY N/A 04/20/2021   Procedure: LEFT HEART CATH AND CORONARY ANGIOGRAPHY;  Surgeon: Wellington Hampshire, MD;  Location: [place] [facility];  Service: Cardiovascular;  Laterality: N/A;   TEE WITHOUT CARDIOVERSION N/A 05/08/2021   Procedure: TRANSESOPHAGEAL ECHOCARDIOGRAM (TEE);  Surgeon: Wonda Olds, MD;  Location: [place];  Service: Open Heart Surgery;  Laterality: N/A;   TONSILLECTOMY     TONSILLECTOMY     VIDEO BRONCHOSCOPY Bilateral 07/26/2015   Procedure: VIDEO BRONCHOSCOPY WITH FLUORO;  Surgeon: [name], MD;  Location: [facility];  Service: Cardiopulmonary;  Laterality: Bilateral;   VIDEO BRONCHOSCOPY WITH ENDOBRONCHIAL NAVIGATION N/A 08/23/2015   Procedure: VIDEO BRONCHOSCOPY WITH ENDOBRONCHIAL NAVIGATION;  Surgeon: Grace Isaac, MD;  Location: [place];  Service: Thoracic;  Laterality: N/A;   VIDEO BRONCHOSCOPY WITH ENDOBRONCHIAL ULTRASOUND N/A 08/23/2015   Procedure: VIDEO BRONCHOSCOPY WITH ENDOBRONCHIAL ULTRASOUND;  Surgeon: Grace Isaac, MD;  Location: [place];  Service: Thoracic;  Laterality: N/A;    REVIEW OF SYSTEMS:  A comprehensive  review of systems was negative except for: Constitutional: positive for fatigue Respiratory: positive for dyspnea on exertion and pleurisy/chest pain   PHYSICAL EXAMINATION: General appearance: alert, cooperative and no distress Head: Normocephalic, without obvious abnormality, atraumatic Neck: no adenopathy, no JVD, supple, symmetrical, trachea midline and thyroid not enlarged, symmetric, no tenderness/mass/nodules Lymph nodes: Cervical, supraclavicular, and axillary nodes normal. Resp: wheezes bilaterally Back: symmetric, no curvature. ROM normal. No CVA tenderness. Cardio: regular rate and rhythm, S1, S2 normal, no murmur, click, rub or gallop GI: soft, non-tender; bowel sounds normal; no masses,  no organomegaly Extremities: extremities normal, atraumatic, no cyanosis or edema  ECOG PERFORMANCE STATUS: 1 - Symptomatic but completely ambulatory  Blood pressure 105/82, pulse 72, temperature 98 F (36.7 C), temperature source Oral, resp. rate 20, height 5\' 9"  (1.753 m), weight 161 lb 9.6 oz (73.3 kg), SpO2 98 %.  LABORATORY DATA: Lab Results  Component Value Date   WBC 6.8 06/14/2021   HGB 10.4 (L) 06/14/2021    HCT 32.4 (L) 06/14/2021   MCV 94.2 06/14/2021   PLT 247 06/14/2021      Chemistry      Component Value Date/Time   NA 140 06/14/2021 1513   NA 143 06/05/2021 1007   NA 140 06/17/2017 1315   K 4.0 06/14/2021 1513   K 4.5 06/17/2017 1315   CL 106 06/14/2021 1513   CO2 25 06/14/2021 1513   CO2 28 06/17/2017 1315   BUN 19 06/14/2021 1513   BUN 18 06/05/2021 1007   BUN 18.1 06/17/2017 1315   CREATININE 0.86 06/14/2021 1513   CREATININE 0.84 12/08/2020 1609   CREATININE 0.8 06/17/2017 1315      Component Value Date/Time   CALCIUM 8.8 (L) 06/14/2021 1513   CALCIUM 9.8 06/17/2017 1315   ALKPHOS 103 06/14/2021 1513   ALKPHOS 82 06/17/2017 1315   AST 11 (L) 06/14/2021 1513   AST 16 06/17/2017 1315   ALT 13 06/14/2021 1513   ALT 10 06/17/2017 1315   BILITOT 0.4 06/14/2021 1513   BILITOT 0.47 06/17/2017 1315       RADIOGRAPHIC STUDIES: DG Chest 1 View  Result Date: 06/07/2021 CLINICAL DATA:  Status post left thoracentesis. EXAM: CHEST  1 VIEW COMPARISON:  06/04/2021 FINDINGS: Previous median sternotomy and CABG procedure. Left atrial clip identified. Small hiatal hernia. Interval left thoracentesis with decreased volume of left pleural effusion. No pneumothorax. Bandlike area of opacification within the perihilar left upper lobe is again noted corresponding to chronic post radiation change. IMPRESSION: Interval left thoracentesis with decreased volume of left pleural effusion. No pneumothorax. Electronically Signed   By: Kerby Moors M.D.   On: 06/07/2021 10:55   DG Chest 2 View  Result Date: 06/11/2021 CLINICAL DATA:  CABG in July.  Chest pain.  History of lung cancer. EXAM: CHEST - 2 VIEW COMPARISON:  06/07/2021 and CT chest 04/20/2021. FINDINGS: Trachea is midline. Heart size normal. Left atrial appendage clip is in place. Post treatment scarring in the left perihilar region with overall volume loss in the left hemithorax. Right lung is clear. Small left pleural effusion. No  pneumothorax. Trace right pleural effusion. IMPRESSION: 1. Small bilateral pleural effusions, left greater than right. 2. Post treatment scarring in the left perihilar region. Electronically Signed   By: Lorin Picket M.D.   On: 06/11/2021 14:47   DG Chest 2 View  Result Date: 06/04/2021 CLINICAL DATA:  Lung cancer.  Shortness of breath. EXAM: CHEST - 2 VIEW COMPARISON:  05/24/2021 FINDINGS: The left  base collapse/consolidative opacity with pleural effusion is similar although the volume pleural fluid appears progressive in the interval. There is soft tissue fullness in the left parahilar region, similar to prior with extension to the peripheral pleura. Right lung remains clear. The cardio pericardial silhouette is enlarged. Probable hiatal hernia. The visualized bony structures of the thorax show no acute abnormality. IMPRESSION: 1. Soft tissue fullness in the left parahilar region, similar to prior. 2. Left base collapse/consolidative opacity with mild progression of left pleural effusion. Electronically Signed   By: Misty Stanley M.D.   On: 06/04/2021 12:51   DG Chest 2 View  Result Date: 05/24/2021 CLINICAL DATA:  80 year old male with shortness of breath. History of coronary bypass x3. EXAM: CHEST - 2 VIEW COMPARISON:  Chest radiograph, 05/19/2021.  CT chest, 04/20/2021. FINDINGS: Cardiac silhouette is within normal limits in size. Postsurgical changes of coronary bypass, including surgical clips and median sternotomy wires. Atrial clip, unchanged. Right lung field is clear. Similar appearance of a band-like consolidation versus scarring in the left lung and pleural thickening. No pneumothorax. No acute osseous abnormality. IMPRESSION: 1. No acute cardiopulmonary process. 2. Band-like consolidation versus scarring in the left lung and pleural thickening, unchanged Electronically Signed   By: Michaelle Birks MD   On: 05/24/2021 15:45   CT Chest Wo Contrast  Result Date: 06/15/2021 CLINICAL DATA:   Non-small cell lung cancer, restaging EXAM: CT CHEST WITHOUT CONTRAST TECHNIQUE: Multidetector CT imaging of the chest was performed following the standard protocol without IV contrast. COMPARISON:  04/20/2021 FINDINGS: Cardiovascular: Heart is normal in size.  Trace pericardial fluid. No evidence of thoracic aortic aneurysm. Atherosclerotic calcifications of the aortic arch. Three vessel coronary atherosclerosis. Mediastinum/Nodes: No suspicious mediastinal lymphadenopathy. Visualized thyroid is unremarkable. Lungs/Pleura: Radiation changes in the left upper lobe, superior segment left lower lobe, and left perihilar region. Associated volume loss in the left hemithorax. Moderate left pleural effusion, mildly increased. Mild centrilobular and paraseptal emphysematous changes, upper lung predominant. No suspicious pulmonary nodules. No pneumothorax. Upper Abdomen: Visualized upper abdomen is notable for vascular calcifications and an 18 mm left adrenal nodule. Musculoskeletal: Degenerative changes of the visualized thoracolumbar spine. Stable probable bone island at T8 (sagittal image 80). Median sternotomy. IMPRESSION: Radiation changes in the left hemithorax. No evidence of recurrent or metastatic disease. Moderate left pleural effusion, increased. Aortic Atherosclerosis (ICD10-I70.0) and Emphysema (ICD10-J43.9). Electronically Signed   By: Julian Hy M.D.   On: 06/15/2021 22:11   IR THORACENTESIS ASP PLEURAL SPACE W/IMG GUIDE  Result Date: 06/07/2021 INDICATION: History of CABG he with left pleural effusion. Request for therapeutic thoracentesis. EXAM: ULTRASOUND GUIDED LEFT THORACENTESIS MEDICATIONS: 1% lidocaine 10 mL COMPLICATIONS: None immediate. PROCEDURE: An ultrasound guided thoracentesis was thoroughly discussed with the patient and questions answered. The benefits, risks, alternatives and complications were also discussed. The patient understands and wishes to proceed with the procedure. Written  consent was obtained. Ultrasound was performed to localize and mark an adequate pocket of fluid in the left chest. The area was then prepped and draped in the normal sterile fashion. 1% Lidocaine was used for local anesthesia. Under ultrasound guidance a 6 Fr Safe-T-Centesis catheter was introduced. Thoracentesis was performed. The catheter was removed and a dressing applied. FINDINGS: A total of approximately 1.2 L of amber fluid was removed. IMPRESSION: Successful ultrasound guided left thoracentesis yielding 1.2 L of pleural fluid. No pneumothorax on post-procedure chest x-ray. Read by: Gareth Eagle, PA-C Electronically Signed   By: Miachel Roux M.D.   On:  06/07/2021 11:42     ASSESSMENT AND PLAN:  This is a very pleasant 80 years old white male with a stage IIB non-small cell lung cancer, squamous cell carcinoma presented with left upper lobe endobronchial lesion in addition to left upper lobe suspicious groundglass opacity diagnosed in September 2016. He is status post curative radiotherapy under the care of Dr. Sondra Come. The patient is currently on observation and he is feeling fine today with no concerning complaints. He had repeat CT scan of the chest performed recently.  I personally and independently reviewed the scans and discussed the results with the patient today. Has a scan showed no concerning findings for disease recurrence or metastasis.  The pleural effusion is secondary to his recent cardiac surgery. I recommended for him to continue on observation with repeat CT scan of the chest in 1 year. The patient was advised to call immediately if he has any other concerning symptoms in the interval. The patient voices understanding of current disease status and treatment options and is in agreement with the current care plan. All questions were answered. The patient knows to call the clinic with any problems, questions or concerns. We can certainly see the patient much sooner if  necessary.  Disclaimer: This note was dictated with voice recognition software. Similar sounding words can inadvertently be transcribed and may not be corrected upon review.

## 2021-06-25 ENCOUNTER — Ambulatory Visit (INDEPENDENT_AMBULATORY_CARE_PROVIDER_SITE_OTHER): Payer: Medicare Other | Admitting: Pharmacist

## 2021-06-25 DIAGNOSIS — I10 Essential (primary) hypertension: Secondary | ICD-10-CM

## 2021-06-25 DIAGNOSIS — I480 Paroxysmal atrial fibrillation: Secondary | ICD-10-CM

## 2021-06-25 DIAGNOSIS — J449 Chronic obstructive pulmonary disease, unspecified: Secondary | ICD-10-CM | POA: Diagnosis not present

## 2021-06-25 DIAGNOSIS — E119 Type 2 diabetes mellitus without complications: Secondary | ICD-10-CM

## 2021-06-25 NOTE — Chronic Care Management (AMB) (Signed)
Chronic Care Management Pharmacy Note  06/29/2021 Name:  Broox Lonigro MRN:  [id number] DOB:  18-Aug-1941  Subjective: Robert Bates is an 80 y.o. year old male who is a primary patient of [name], NP.  The CCM team was consulted for assistance with disease management and care coordination needs.    Engaged with patient by telephone for follow up visit in response to provider referral for pharmacy case management and/or care coordination services.   Consent to Services:  The patient was given information about Chronic Care Management services, agreed to services, and gave verbal consent prior to initiation of services.  Please see initial visit note for detailed documentation.   Patient Care Team: [name], NP as PCP - General Nahser, Wonda Cheng, MD as PCP - Cardiology (Cardiology) Elsie Stain, MD as Attending Physician (Pulmonary Disease) Virgina Evener, OD (Optometry) Cherre Robins, PharmD (Pharmacist)  Recent office visits: 04/11/2021 - PCP Inda Castle, NP) F/U DM and HTN. Restarted Advair HFA 115/21 2 puffs twice a day; Started sildenafil 20mg  as needed for ED.  03/07/2021 - PCP Inda Castle) Acute visit for HA; HA started after hitting head on a beam on 03/04/2021; patient also reported chest pain on 5/3. CT head ordered due to taking Xarelto; CT showed no acute abnormalities and no change in 2 meningiomas previously present. 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 Sondra Come. Currently on observation. Repeat CT scan of the chest performed recently showed no concerning findings for disease recurrence or metastasis.  The pleural effusion is secondary to  his recent cardiac surgery. Recommended cont observation with repeat chest CT in 1 year 06/11/2021 - Cardio ([place], [facility]) Left plural effusion re-accumulation per xray. Recommended continue furosemide 40mg  daily and potassium 20 mEq daily. Recheck in 2 weeks.  06/05/2021 - Cardio [place], [facility]) Hospital F/U S/P CABG. Continue Aspirin until 3 months post CABG, Stopped metoprolol due to lightheadedness and low HR. Started furosemide 40mg  daily for 7 days and as needed thereafter for weight gain > 3 lbs and potassium 20 mEq daily for 7 days and as needed when taking furosemide.  BMET checked and will be repeated in 1 week.  06/04/2021 - Cardiothoracic surgery [facility], [facility]) F/U CABG and left plural effusion. Need thoracentesis on 05/18/2021 while in hospital. Unable to give diuretic due to low BP. Ordered US guided thoracentesis with repeat xray in 1 week.  05/28/2021 - Cardio ([place], [facility]) [facility]. Noted to be bradycardic. Recommended lower dose of metoprolol to 12.5mg  bid. No other med changes.  05/24/2021 - Cardiothoracic surgery (Dr Orvan Seen) F/U CABG. No med changes.  04/18/2021 - Cardio Glenford Peers, NP) Video Visit to discuss abnormal CCTA from 04/16/2021. Showed flow limiting lesions in the mLAD, mLCX and mid diag vessel. Ordered cardiac cath. since allergy to contact dye - premedication planned wiht prednisone for Q13, Q7 and A1h before cath and benadryl 25mg  1 our before cath. Instructed to hold Xarelto prior to cath. Removed levetiracetam from med list - patinet not taking.   03/12/2021 - Cardio Glenford Peers, NP) Atypical CP. Ordered CCTA - added beta blocker for scan and also will premedicate with prednisone Q13, 7 and 1 hour before scan 2/2 h/o contract dye allergy; NTG added.  Hospital visits: 05/08/2021 to [facility] Ad[facility] at [facility] - Admitted for CABG x 3 performed on 05/08/2021. Post Op blood loss anemia with transfusion on 07/6. Medications Started: cephalexin 500mg  every 8  hours  Dulera 200/58mcg - 2 puffs twice a day  Metoprolol tartrate 25mg  twice a day  Potassium chloride 10 mEq daily for 4 days  Torsemide 20mg  take 1 tablet daily for 4 days Medications Stopped: Advair HFA 115/21 mcg and NTG SL Medication Changes: amiodarone 200mg  bid for 5 days, then once a day thereafter  04/20/2021 to 04/26/2021 [facility] Admission Cath @ [place] on 04/20/2021. Showe significant disease. He was to have bypass surgery but was found to be COVID positive. He was treated with Paxlovid. He was kept in hospital for 5 days toremain on heparin drip since he has Afib. Will return to hospital for CABG 05/08/2021. New Medication: Atorvastatin 80mg ; amiodarone 200mg  bid for 7 days, then 200mg  daily thereafter; metfromin 500mg  bid Medication stopped: pravastatin 80mg ; diltiazem Medication changes: none  Objective:  Lab Results  Component Value Date   CREATININE 0.86 06/14/2021   CREATININE 0.86 06/05/2021   CREATININE 0.88 05/20/2021    Lab Results  Component Value Date   HGBA1C 6.5 04/11/2021   Last diabetic Eye exam: No results found for: HMDIABEYEEXA  Last diabetic Foot exam: No results found for: HMDIABFOOTEX      Component Value Date/Time   CHOL 146 04/11/2021 1421   TRIG 119 04/11/2021 1421   HDL 63 04/11/2021 1421   CHOLHDL 2.3 04/11/2021 1421   VLDL 30.8 03/09/2020 1429   LDLCALC 62 04/11/2021 1421   LDLDIRECT 142.2 12/26/2008 1032    Hepatic Function Latest Ref Rng & Units 06/14/2021 05/04/2021 12/08/2020  Total Protein 6.5 - 8.1 g/dL 5.9(L) 5.9(L) 6.1  Albumin 3.5 - 5.0 g/dL 2.8(L) 3.3(L) -  AST 15 - 41 U/L 11(L) 16 13  ALT 0 - 44 U/L 13 13 12   Alk Phosphatase 38 - 126 U/L 103 80 -  Total Bilirubin 0.3 - 1.2 mg/dL 0.4 0.5 0.4  Bilirubin, Direct 0.0 - 0.3 mg/dL - - -    Lab Results  Component Value Date/Time   TSH 1.31 11/08/2019 02:55 PM   TSH 1.53 04/27/2018 03:09 PM    CBC Latest Ref Rng & Units 06/14/2021 05/17/2021 05/15/2021  WBC 4.0 - 10.5  K/uL 6.8 9.3 9.4  Hemoglobin 13.0 - 17.0 g/dL 10.4(L) 9.5(L) 9.5(L)  Hematocrit 39.0 - 52.0 % 32.4(L) 29.9(L) 29.7(L)  Platelets 150 - 400 K/uL 247 302 268    No results found for: VD25OH  Clinical ASCVD: Yes  The ASCVD Risk score Mikey Bussing DC Jr., et al., 2013) failed to calculate for the following reasons:   The 2013 ASCVD risk score is only valid for ages 10 to 71    Other: CHADS2VASc = 4 (age = 2; vascular disease; diabetes)  Social History   Tobacco Use  Smoking Status Former   Packs/day: 1.00   Years: 57.00   Pack years: 57.00   Types: Cigarettes, Cigars   Quit date: 08/08/2015   Years since quitting: 5.8  Smokeless Tobacco Former   Types: Chew   Quit date: 11/04/1958  Tobacco Comments   occasional use sneaks around   BP Readings from Last 3 Encounters:  06/28/21 109/69  06/20/21 105/82  06/11/21 115/73   Pulse Readings from Last 3 Encounters:  06/28/21 70  06/20/21 72  06/11/21 78   Wt Readings from Last 3 Encounters:  06/28/21  165 lb (74.8 kg)  06/20/21 161 lb 9.6 oz (73.3 kg)  06/11/21 158 lb (71.7 kg)    Assessment: Review of patient past medical history, allergies, medications, health status, including review of consultants reports, laboratory and other test data, was performed as part of comprehensive evaluation and provision of chronic care management services.   SDOH:  (Social Determinants of Health) assessments and interventions performed:  SDOH Interventions    Flowsheet Row Most Recent Value  SDOH Interventions   Transportation Interventions Intervention Not Indicated        CCM Care Plan  Allergies  Allergen Reactions   Iodine Other (See Comments)    neck swells   Iohexol Swelling    Neck and gland swelling per patient.    Medications Reviewed Today     Reviewed by Marylen Ponto, LPN (Licensed Practical Nurse) on 06/28/21 at 1051  Med List Status: <None>   Medication Order Taking? Sig Documenting Provider Last Dose Status Informant   acetaminophen (TYLENOL) 500 MG tablet [id number] Yes Take 1-2 tablets (500-1,000 mg total) by mouth every 6 (six) hours as needed. Lars Pinks M, PA-C Taking Active   albuterol (VENTOLIN HFA) 108 (90 Base) MCG/ACT inhaler [id number] Yes Inhale 2 puffs into the lungs every 6 (six) hours as needed for wheezing or shortness of breath. [name], NP Taking Active Self  amiodarone (PACERONE) 200 MG tablet [id number] Yes Take 1 tablet (200 mg total) by mouth 2 (two) times daily for 5 days, then take tablet (200 mg) once daily thereafter  Patient taking differently: Take 200 mg by mouth daily.   Nani Skillern, PA-C Taking Active   aspirin EC 81 MG tablet [id number] Yes Take 1 tablet (81 mg total) by mouth in the morning. Swallow whole. Nani Skillern, PA-C Taking Active   atorvastatin (LIPITOR) 80 MG tablet [id number] Yes Take 1 tablet (80 mg total) by mouth daily. Margie Billet, NP Taking Active   furosemide (LASIX) 20 MG tablet [id number] Yes Take 20 mg by mouth daily as needed. [provider] Taking Active     Discontinued 05/08/21 1058 mometasone-formoterol (DULERA) 200-5 MCG/ACT AERO [id number] Yes Inhale 2 puffs into the lungs 2 (two) times daily. [provider] Taking Active   Multiple Vitamin (MULTIVITAMIN WITH MINERALS) TABS tablet [id number] Yes Take 1 tablet by mouth daily in the afternoon. [provider] Taking Active   omeprazole (PRILOSEC) 40 MG capsule [id number] Yes Take 1 capsule (40 mg total) by mouth daily. [name], NP Taking Active Self  potassium chloride SA (KLOR-CON) 20 MEQ tablet [id number] Yes Take 20 mEq by mouth. Take 1 tablet (20 mEq total) by mouth as needed (with lasix tablet.). [provider] Taking Active   rivaroxaban (XARELTO) 20 MG TABS tablet [id number] Yes TAKE 1 TABLET BY MOUTH DAILY WITH SUPPER Nahser, Wonda Cheng, MD Taking Active   tamsulosin (FLOMAX) 0.4 MG CAPS capsule [id number] Yes TAKE 1 CAPSULE  (0.4 MG TOTAL) BY MOUTH DAILY. [name], NP Taking Active   traMADol Veatrice [place]bon) 50 MG tablet [id number] Yes Take 1 tablet (50 mg total) by mouth every 6 (six) hours as needed for moderate pain. 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   Coronary artery  disease 05/08/2021   COVID-19 virus infection 04/23/2021   Unstable angina ([place]) 04/20/2021   Erectile dysfunction 04/11/2021   Head trauma 03/07/2021   Left leg pain 04/07/2020   Benign prostatic hyperplasia with nocturia 03/08/2020   Seizures ([place]) 02/18/2020   PAF (paroxysmal atrial fibrillation) ([place]) 08/15/2017   Diabetes type 2, controlled ([place]) 07/31/2017   Cholecystitis 07/21/2017   COPD GOLD II  04/03/2017   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[place]) 09/06/2015   Lung cancer ([place]) 11/07/2014   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   HTN (hypertension) 04/29/2014   Meningioma ([place]) 10/25/2013   Low back pain 10/25/2013   Osteoarthritis 08/18/2012   Atypical chest pain 08/14/2011   KERATOSIS 10/09/2010   SCIATICA, RIGHT 10/09/2010   UNSPECIFIED HEARING LOSS 05/21/2010   Hyperlipidemia 05/14/2010   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   RENAL CYST, RIGHT 02/05/2010   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[place]) 01/29/2010   LIPOMA 11/17/2009   MICROSCOPIC HEMATURIA 08/11/2008   GERD 07/26/2008   DERMOID CYST 07/25/2008    Immunization History  Administered Date(s) Administered   Fluad Quad(high Dose 65+) 07/23/2019, 09/04/2020   Influenza Split 08/02/2011, 08/18/2012   Influenza Whole 08/17/2008   Influenza, High Dose Seasonal PF 08/09/2015, 09/30/2016, 07/30/2017, 08/07/2018   Influenza,inj,Quad PF,6+ Mos 07/27/2013, 08/09/2014   PFIZER Comirnaty(Gray Top)Covid-19 Tri-Sucrose  Vaccine 02/16/2021   PFIZER(Purple Top)SARS-COV-2 Vaccination 01/28/2020, 02/21/2020, 09/04/2020   Pneumococcal Conjugate-13 08/09/2014   Pneumococcal Polysaccharide-23 05/14/2010   Td 07/25/2008    Conditions to be addressed/monitored: Atrial Fibrillation, HTN, HLD, COPD and BPH; type 2 DM; GERD; seizure, aortic aneurysm.  Care Plan : General Pharmacy (Adult)  Updates made by Cherre Robins, PHARMD since 06/29/2021 12:00 AM     Problem: Management of chronic medicaiton conditions and medication: Atrial Fibrillation, HTN, HLD, type 2 DM, Pulmonary Disease and BPH; seizures; h/o lung cancer and meningioma; GERD; aortic aneurysm   Priority: High  Onset Date: 02/08/2021  Note:   Current Barriers:  Does not adhere to prescribed medication regimen Management of chronic medical conditions and medication management Unable to afford medication therapy  Pharmacist Clinical Goal(s):  Over the next 180 days, patient will achieve adherence to monitoring guidelines and medication adherence to achieve therapeutic efficacy contact provider office for questions/concerns as evidenced notation of same in electronic health record through collaboration with PharmD and provider.   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  Interventions:  Educated on limiting high CHO foods; recommended increase non starchy vegetables Counseled on A1c goal.  Discussed benefits  of metformin therapy - patient declined to restart Consider rechecking A1c in 1 to 2 months. If > 6.5 consider restarting metformin  Hypertension (BP goal <130/80) Controlled and actually has low BP/HR during recent hospitalization when he was tried on metoprolol Crrent treatment:  None  Previously took diltiazem but stopped during hospitalization when amiodarone was started; tried metoprolol - bradycardia Patient checks BP at home infrequently - no reading provided Patient has failed these meds in the past: None noted  Denies/reports hypotensive/hypertensive symptoms -  he was having dizziness but attributed this to [place] / levetiracetam Interventions:  Educated on BP goals Recommended continue current antiHTN therapy  Hyperlipidemia / CAD (LDL < 70) Controlled Recent CABG x 3 vessels 05/2021. Patient continues to have issues with plural effusion s/p CABG. Taking furosemide and potassium daily. Followed by cardio and cardiothoracic surgery team.  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 Recommended try to increase intake of non starchy vegetables Recommended continue current therapy  Chronic Obstructive Pulmonary Disease: Controlled Taking furosemide + KCL for plural effusion Current treatment:  albuterol as rescue inhaler Dulera 200/52mcg 2 puffs into lungs twice a day - patient reports not taking. Instead he is taking Advair HFA 115 mcg 2 puffs twice a day GOLD Classification: 2 (FEV1 50-79%) Most recent Pulmonary Function Testing: 05/19/2017 - spirometry FVC 2.84 (73%) FEV1 1.74 (62%) FEV1/FVC 61% Current COPD Classification:  A (low sx, <2 exacerbations/yr) Mailed [place] for [place] and  [place] (for Symbicort) after last visit in June. Wife endorses that they received but has not completed yet.  [facility] does not have patient assistance program but Advair dose (must spend $600 out of pocket on Rx medications to qualify) Interventions:  Encouraged patient to complete [place] application or if he prefer the [place] / Adviar application (mailed to patient today)  Continue to take furosemide and potasium as directed for plural effusion Continue to take Advair HFA 2 puff twice a day  Atrial Fibrillation: NSR since last hospitalization but did recent had diltiazem changed to amiodarone due to being in afib prior to cardiac cath Current rate/rhythm controller: amiodarone 200mg  daily Anticoagulant treatment: Xarelto 20mg  daily with evening meal Mailed patient PAP application after CCM visit in June - Wife endorses that they received but have not completed yet.  CHADS2VASc score: 4 (age = 2, vascular disease and diabetes) Interventions:  Recommended continue current regimen Patient to have LFTs and TSH rechecked per cardiology in 4 to 6 weeks Encouraged patient to complete application for Xarelto patient assistance and return to office.   GERD:  Patient is currently controlled on the following medications:  Omeprazole 40mg  daily Patient has failed these meds in past: None noted  Triggers: laying down after eating Recommend continue current medications   Seizures:  No current therapy Levetiracetam removed from med list 04/2021 noting patient is not taking.  In past patient endorsed to clinical pharmacist that he had stopped levetiracetam due to cost of $47/month, because he thought it was causing nausea and because he does not feel that he actually had a seizure.  He had f/u with neurology 02/26/21 but patient cancelled and has not rescheduled yet Interventions:  Recommended patient call to rescheduled appt with neurologist.    Patient Goals/Self-Care Activities Over the next 180  days, patient will:  take medications as prescribed and collaborate with provider on medication access solutions Follow up with cardiologist and cardiothoracic team Follow up with neurology Start cardiac rehab program Complete an return patient assistance applications to office.   Follow Up Plan: Telephone follow up appointment with care management team member scheduled for:  1 month                Medication Assistance:  Patient states he has not  gotten applications for Symbicort and Louanna Raw that I mailed 1 month ago. Will resend.   Patient's preferred pharmacy is:  [facility] [address] [place] [address] Phone: [phone] Fax: [facility] [address] [place] [address] Phone: [phone] Fax: [phone]   Follow Up:  Patient agrees to Care Plan and Follow-up.  Plan: Telephone follow up appointment with care management team member scheduled for:  1 month  Cherre Robins, PharmD Clinical Pharmacist [facility]

## 2021-06-26 ENCOUNTER — Encounter: Payer: Self-pay | Admitting: Surgery

## 2021-06-26 ENCOUNTER — Other Ambulatory Visit: Payer: Self-pay | Admitting: Surgery

## 2021-06-26 DIAGNOSIS — Z951 Presence of aortocoronary bypass graft: Secondary | ICD-10-CM

## 2021-06-27 ENCOUNTER — Other Ambulatory Visit (HOSPITAL_BASED_OUTPATIENT_CLINIC_OR_DEPARTMENT_OTHER): Payer: Self-pay

## 2021-06-27 ENCOUNTER — Ambulatory Visit
Admission: RE | Admit: 2021-06-27 | Discharge: 2021-06-27 | Disposition: A | Discharge status: Home / Self Care | Payer: Medicare Other | Source: Ambulatory Visit | Attending: Surgery | Admitting: Surgery

## 2021-06-27 DIAGNOSIS — J90 Pleural effusion, not elsewhere classified: Secondary | ICD-10-CM | POA: Diagnosis not present

## 2021-06-27 DIAGNOSIS — Z9889 Other specified postprocedural states: Secondary | ICD-10-CM | POA: Diagnosis not present

## 2021-06-27 DIAGNOSIS — Z951 Presence of aortocoronary bypass graft: Secondary | ICD-10-CM

## 2021-06-28 ENCOUNTER — Other Ambulatory Visit: Payer: Self-pay

## 2021-06-28 ENCOUNTER — Ambulatory Visit (INDEPENDENT_AMBULATORY_CARE_PROVIDER_SITE_OTHER): Payer: Self-pay | Admitting: Physician Assistant

## 2021-06-28 ENCOUNTER — Other Ambulatory Visit: Payer: Self-pay | Admitting: Surgery

## 2021-06-28 VITALS — BP 109/69 | HR 70 | Resp 20 | Ht 69.0 in | Wt 165.0 lb

## 2021-06-28 DIAGNOSIS — Z951 Presence of aortocoronary bypass graft: Secondary | ICD-10-CM

## 2021-06-28 DIAGNOSIS — J90 Pleural effusion, not elsewhere classified: Secondary | ICD-10-CM

## 2021-06-28 DIAGNOSIS — I2510 Atherosclerotic heart disease of native coronary artery without angina pectoris: Secondary | ICD-10-CM

## 2021-06-28 NOTE — Progress Notes (Signed)
[address]       [place] [address]             [phone]       HPI:  Robert Bates is a 80 y.o. male patient status post coronary artery bypass grafting x4 by Dr. Julien Girt on 05/08/2021 for multivessel coronary artery disease presenting with normal left ventricular ejection fraction and mildly reduced right ventricular systolic function.  While in the hospital he had a left thoracentesis on 05/18/2021 for a left pleural effusion.  He also was treated for his COPD with [facility] and albuterol nebs.  He has a history of paroxysmal atrial fibrillation and had some atrial bigeminy in the hospital and was converted on amiodarone IV and digoxin.  He was started on some midodrine for hypotension.  Dr. Julien Girt saw the patient on 7/21 and on his chest x-ray that showed a small left pleural effusion.   He was seen in the office about 10 days later and was noted to have progression of the left pleural effusion.  He had ultrasound-guided left thoracentesis on 06/07/2021 yielding 1.2 L of clear amber fluid.  His follow-up chest x-ray showed considerable improvement in the expansion of his left lung and no pneumothorax.  [name] reports he is continue to progress overall.  He denies shortness of breath but says he continues to have some wheezing from time to time.  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  He is gradually advancing his activity.  He returns today for follow-up for his recurrent left pleural effusion. He has had some increased work of breathing with activity.   Current Outpatient Medications  Medication Sig Dispense Refill   acetaminophen (TYLENOL) 500 MG tablet Take 1-2 tablets (500-1,000 mg total) by mouth every 6 (six) hours as needed. 30 tablet 0   albuterol (VENTOLIN HFA) 108 (90 Base) MCG/ACT inhaler Inhale 2 puffs into the lungs every 6 (six) hours as needed for  wheezing or shortness of breath. 8.5 g 5   amiodarone (PACERONE) 200 MG tablet Take 1 tablet (200 mg total) by mouth 2 (two) times daily for 5 days, then take tablet (200 mg) once daily thereafter 35 tablet 1   aspirin EC 81 MG tablet Take 1 tablet (81 mg total) by mouth in the morning. Swallow whole.     atorvastatin (LIPITOR) 80 MG tablet Take 1 tablet (80 mg total) by mouth daily. 90 tablet 1   furosemide (LASIX) 40 MG tablet Take 1 tablet (40 mg total) by mouth as needed for edema. Take with Potassium tablet. 30 tablet 2   mometasone-formoterol (DULERA) 200-5 MCG/ACT AERO Inhale 2 puffs into the lungs 2 (two) times daily.     Multiple Vitamin (MULTIVITAMIN WITH MINERALS) TABS tablet Take 1 tablet by mouth daily in the afternoon.     omeprazole (PRILOSEC) 40 MG capsule Take 1 capsule (40 mg total) by mouth daily. 30 capsule 5   potassium chloride SA (KLOR-CON) 20 MEQ tablet Take 1 tablet (20 mEq total) by mouth as needed (edema.  To be taken with lasix tablet.). Take with Lasix tablet. 30 tablet 2   rivaroxaban (XARELTO) 20 MG TABS tablet TAKE 1 TABLET BY MOUTH DAILY WITH SUPPER 30 tablet 5   tamsulosin (FLOMAX) 0.4 MG CAPS capsule TAKE 1 CAPSULE (0.4 MG TOTAL) BY MOUTH DAILY. 30 capsule 3  traMADol (ULTRAM) 50 MG tablet Take 1 tablet (50 mg total) by mouth every 6 (six) hours as needed for moderate pain. 24 tablet 0   No current facility-administered medications for this visit.    Physical Exam  BP-115/73 Pulse 78 Respirations 20 O2 sat 96%  General-Robert Bates walked in to the office today and appears stable with his mobility. Heart-regular rate and rhythm, no murmur. Chest-breath sounds are diminished in the left base.  There is mild wheezing noted. Extremities-mild, 1+ edema in the right lower extremity following saphenous vein harvest.  There is no edema in his left leg. Wounds-the sternotomy incision is healing well.  A monofilament suture tag was protruding through the midpoint of  the incision.  This was with sterile suture scissors.  Sternum is stable.  The right lower extremity EVH incisions have some superficial eschar  Diagnostic Tests:  CLINICAL DATA:  80 year old male status post CABG on July 5th and history of treated left lung cancer.   EXAM: CHEST - 2 VIEW   COMPARISON:  Restaging chest CT 06/14/2021 and earlier.   FINDINGS: Volume loss and architectural distortion in the left lung extending from the hilum to the peripheral pleura appears stable. A small to moderate left pleural effusion persists, and has mildly increased compared to June. Postoperative changes to the mediastinum. Stable cardiac size and mediastinal contours. Visualized tracheal air column is within normal limits. The right lung appears stable and negative. No pneumothorax. No acute osseous abnormality identified. Negative visible bowel gas pattern.   IMPRESSION: 1. Small to moderate left pleural effusion persists, and is increased since June as seen on the restaging CT earlier this month. 2. Otherwise stable post treatment appearance of the left lung. 3. No new cardiopulmonary abnormality.     Electronically Signed   By: Genevie Ann M.D.   On: 06/27/2021 10:31   Impression / Plan:   Robert Bates is about 7 weeks status post coronary bypass grafting after presenting with vessel coronary disease and normal ejection fraction.  He is progressing well overall.  He will need to continue with his aspirin and atorvastatin.  He was not started on beta-blocker due to hypotension postoperatively.   Left pleural effusion, recurrent-- he had left thoracentesis while in the hospital and this was repeated three weeks ago yielding about 1200 mL of amber fluid.  On today's chest x-ray there is again early re-accumulation of the left effusion as compared to the last CXR.  I asked [name] to continue with his Lasix 40 mg p.o. daily and potassium 20 mEq p.o. daily however he was asked to stop it by one  of his doctors. If the left pleural effusion has re-accumulated, we will need to consider pleurx catheter. We sent pleural fluid sample for cytology given his history of stage IIb non-small cell carcinoma of the lung treated with radiation in 2016.    Postoperative atrial fibrillation--he remains in sinus rhythm on amiodarone 200 mg p.o. daily.  I explained that we will continue this for at least another month and consider stopping it if he remains in sinus rhythm.  Follow-up in 2 weeks with a CXR. Sent for thoracentesis with cultures and cytology.  Nicholes Rough, PA-C

## 2021-06-29 NOTE — Patient Instructions (Signed)
Visit Information  PATIENT GOALS:  Goals Addressed             This Visit's Progress    Chronic Care Management Pharmacy Care Plan       CARE PLAN ENTRY (see longitudinal plan of care for additional care plan information)  Current Barriers:  Chronic Disease Management support, education, and care coordination needs related to Hypertension, Hyperlipidemia, Diabetes, COPD, AFib, GERD, BPH, Seizures   Hypertension BP Readings from Last 3 Encounters:  06/28/21 109/69  06/20/21 105/82  06/11/21 115/73  Pharmacist Clinical Goal(s): Over the next 90 days, patient will work with PharmD and providers to maintain BP goal <140/90 Current regimen:  none Interventions: Discussed BP goal Patient self care activities - Over the next 90 days, patient will: Checked blood pressure at home 2 to 3 times per week and record for future visits  Hyperlipidemia Lab Results  Component Value Date/Time   LDLCALC 62 04/11/2021 02:21 PM   LDLDIRECT 142.2 12/26/2008 10:32 AM  Pharmacist Clinical Goal(s): Over the next 90 days, patient will work with PharmD and providers to maintain LDL goal < 100 Current regimen:  Atorvastatin 80mg  daily Interventions: Discussed LDL goal Patient self care activities - Over the next 90 days, patient will: Maintain cholesterol medication regimen.   Diabetes Lab Results  Component Value Date/Time   HGBA1C 6.5 04/11/2021 01:41 PM   HGBA1C 6.0 (H) 12/08/2020 04:09 PM  Pharmacist Clinical Goal(s): Over the next 90 days, patient will work with PharmD and providers to maintain A1c goal <7% Current regimen:  Diet and exercise recommeded Interventions: Discussed A1c goal Patient self care activities - Over the next 90 days, patient will: Maintain A1c <7% Limit intake of sugar, potatoes, pasta, rice and bread.  Consider restarting metformin 500mg  twice a day if next A1c is 6.5% or higher  COPD Pharmacist Clinical Goal(s) Over the next 90 days, patient will work  with PharmD and providers to reduce symptoms associated with COPD and reduce barriers to access of preferred medication regimens Current regimen:  Albuterol HFA 2 puffs every 6 hours as needed for wheezing Advair inhaler HFR - inhale 2 puffs twice a day Interventions: Discussed patient's preference for Advair over [place] with PCP to identify optimal inhaler therapy for patient based on his preferances, insurane formulary and patient assistance if available.  Mailed patient assistance applications for [place] [employer] [place] and [place]  Patient self care activities - Over the next 90 days, patient will: Complete applications for patient assistance and return to office Maintain medication regimen for COPD  AFib Pharmacist Clinical Goal(s) Over the next 90 days, patient will work with PharmD and providers to reduce risk of stroke associated with Afib and reduce barriers to preferred medication regimen Current regimen:  Amiodarone 200mg  daily  Xarelto 20mg  daily Interventions: Mailed patient assistance application for Xarelto Patient self care activities - Over the next 90 days, patient will: Complete patient assistance for Xarelto Maintain current medication regimen for atrial fibrillation Have LFTs and TSH checked in next 4 to 6 weeks (cardiology plans to order)   Seizures Pharmacist Clinical Goal(s) Over the next 90 days, patient will work with PharmD and providers to remain seizure free Current regimen:  none Interventions: Recommended he discuss medication, side effects and possible alternatives with neurology office. Will also coordinate with them as needed to find acceptable alternative for patient Patient self care activities - Over the next 90 days, patient will: Call neurology to make follow up appointment with Robert Bates  [facility] [address], [place], [place] [address] [phone]  Medication management Pharmacist Clinical Goal(s): Over  the next 90 days, patient will work with PharmD and providers to maintain optimal medication adherence Current pharmacy: [facility] Interventions Comprehensive medication review performed. Continue current medication management strategy Patient self care activities - Over the next 90 days, patient will: Focus on medication adherence by filling and taking medications appropriately  Take medications as prescribed Report any questions or concerns to PharmD and/or provider(s)  Patient Goals/Self-Care Activities Over the next 180 days, patient will:  Take medications as prescribed and collaborate with provider on medication access solutions Follow up with cardiologist and cardiothoracic team Follow up with neurology Start cardiac rehab program Complete and return patient assistance applications to clinical pharmacist  Please see past updates related to this goal by clicking on the "Past Updates" button in the selected goal          The patient verbalized understanding of instructions, educational materials, and care plan provided today and agreed to receive a mailed copy of patient instructions, educational materials, and care plan.   Telephone follow up appointment with care management team member scheduled for: 1 month  Cherre Robins, PharmD Clinical Pharmacist [facility]

## 2021-07-03 ENCOUNTER — Ambulatory Visit (HOSPITAL_COMMUNITY)
Admission: RE | Admit: 2021-07-03 | Discharge: 2021-07-03 | Disposition: A | Discharge status: Home / Self Care | Payer: Medicare Other | Source: Ambulatory Visit | Attending: Surgery | Admitting: Surgery

## 2021-07-03 ENCOUNTER — Other Ambulatory Visit: Payer: Self-pay

## 2021-07-03 ENCOUNTER — Other Ambulatory Visit: Payer: Self-pay | Admitting: Surgery

## 2021-07-03 DIAGNOSIS — J90 Pleural effusion, not elsewhere classified: Secondary | ICD-10-CM | POA: Insufficient documentation

## 2021-07-03 DIAGNOSIS — Z0389 Encounter for observation for other suspected diseases and conditions ruled out: Secondary | ICD-10-CM | POA: Diagnosis not present

## 2021-07-03 MED ORDER — LIDOCAINE HCL 1 % IJ SOLN
INTRAMUSCULAR | Status: AC
  Filled 2021-07-03: qty 20

## 2021-07-03 NOTE — Progress Notes (Signed)
Patient presents for therapeutic and diagnostic right sided thoracentesis. [place] limited shows small amount of pleural fluid. Patient requested the procedure be deferred until such time as he is more symptomatic. Procedure not performed.

## 2021-07-05 ENCOUNTER — Other Ambulatory Visit (HOSPITAL_BASED_OUTPATIENT_CLINIC_OR_DEPARTMENT_OTHER): Payer: Self-pay

## 2021-07-05 ENCOUNTER — Other Ambulatory Visit: Payer: Self-pay | Admitting: Physician Assistant

## 2021-07-05 DIAGNOSIS — Z951 Presence of aortocoronary bypass graft: Secondary | ICD-10-CM | POA: Diagnosis not present

## 2021-07-05 DIAGNOSIS — Z48812 Encounter for surgical aftercare following surgery on the circulatory system: Secondary | ICD-10-CM | POA: Diagnosis not present

## 2021-07-05 MED FILL — Rivaroxaban Tab 20 MG: ORAL | 30 days supply | Qty: 30 | Fill #2 | Status: AC

## 2021-07-06 ENCOUNTER — Other Ambulatory Visit (HOSPITAL_BASED_OUTPATIENT_CLINIC_OR_DEPARTMENT_OTHER): Payer: Self-pay

## 2021-07-10 ENCOUNTER — Other Ambulatory Visit: Payer: Self-pay | Admitting: Surgery

## 2021-07-10 DIAGNOSIS — Z951 Presence of aortocoronary bypass graft: Secondary | ICD-10-CM

## 2021-07-11 ENCOUNTER — Other Ambulatory Visit: Payer: Self-pay

## 2021-07-11 ENCOUNTER — Encounter: Payer: Self-pay | Admitting: Physician Assistant

## 2021-07-11 ENCOUNTER — Other Ambulatory Visit (HOSPITAL_BASED_OUTPATIENT_CLINIC_OR_DEPARTMENT_OTHER): Payer: Self-pay

## 2021-07-11 ENCOUNTER — Ambulatory Visit (INDEPENDENT_AMBULATORY_CARE_PROVIDER_SITE_OTHER): Payer: Self-pay | Admitting: Physician Assistant

## 2021-07-11 ENCOUNTER — Ambulatory Visit
Admission: RE | Admit: 2021-07-11 | Discharge: 2021-07-11 | Disposition: A | Discharge status: Home / Self Care | Payer: Medicare Other | Source: Ambulatory Visit | Attending: Surgery | Admitting: Surgery

## 2021-07-11 VITALS — BP 97/66 | HR 73 | Resp 20 | Ht 69.0 in | Wt 165.0 lb

## 2021-07-11 DIAGNOSIS — J90 Pleural effusion, not elsewhere classified: Secondary | ICD-10-CM | POA: Diagnosis not present

## 2021-07-11 DIAGNOSIS — Z951 Presence of aortocoronary bypass graft: Secondary | ICD-10-CM

## 2021-07-11 DIAGNOSIS — Z9889 Other specified postprocedural states: Secondary | ICD-10-CM | POA: Diagnosis not present

## 2021-07-11 DIAGNOSIS — J984 Other disorders of lung: Secondary | ICD-10-CM | POA: Diagnosis not present

## 2021-07-11 DIAGNOSIS — Z48812 Encounter for surgical aftercare following surgery on the circulatory system: Secondary | ICD-10-CM | POA: Diagnosis not present

## 2021-07-11 NOTE — Progress Notes (Signed)
[address]       [place] [address]             [phone]       HPI:  Robert Bates is a 80 y.o. male patient status post coronary artery bypass grafting x4 by Dr. Julien Girt on 05/08/2021 for multivessel coronary artery disease presenting with normal left ventricular ejection fraction and mildly reduced right ventricular systolic function.  While in the hospital he had a left thoracentesis on 05/18/2021 for a left pleural effusion.  He also was treated for his COPD with [facility] and albuterol nebs.  He has a history of paroxysmal atrial fibrillation and had some atrial bigeminy in the hospital and was converted on amiodarone IV and digoxin.  He was started on some midodrine for hypotension.  Dr. Julien Girt saw the patient on 7/21 and on his chest x-ray that showed a small left pleural effusion.   He was seen in the office about 10 days later and was noted to have progression of the left pleural effusion.  He had ultrasound-guided left thoracentesis on 06/07/2021 yielding 1.2 L of clear amber fluid.  His follow-up chest x-ray showed considerable improvement in the expansion of his left lung and no pneumothorax.    [name] reports he is continue to progress overall.  He denies significant shortness of breath and is anxious to return to work.   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  He is gradually advancing his activity.  He returns today for follow-up for his recurrent left pleural effusion. He went for a thoracentesis after our last appointment and they were not able to tap his pleural space because there was not enough fluid.   Current Outpatient Medications  Medication Sig Dispense Refill   acetaminophen (TYLENOL) 500 MG tablet Take 1-2 tablets (500-1,000 mg total) by mouth every 6 (six) hours as needed. 30 tablet 0   albuterol (VENTOLIN HFA) 108 (90 Base) MCG/ACT  inhaler Inhale 2 puffs into the lungs every 6 (six) hours as needed for wheezing or shortness of breath. 8.5 g 5   amiodarone (PACERONE) 200 MG tablet Take 1 tablet (200 mg total) by mouth 2 (two) times daily for 5 days, then take tablet (200 mg) once daily thereafter 35 tablet 1   aspirin EC 81 MG tablet Take 1 tablet (81 mg total) by mouth in the morning. Swallow whole.     atorvastatin (LIPITOR) 80 MG tablet Take 1 tablet (80 mg total) by mouth daily. 90 tablet 1   furosemide (LASIX) 40 MG tablet Take 1 tablet (40 mg total) by mouth as needed for edema. Take with Potassium tablet. 30 tablet 2   mometasone-formoterol (DULERA) 200-5 MCG/ACT AERO Inhale 2 puffs into the lungs 2 (two) times daily.     Multiple Vitamin (MULTIVITAMIN WITH MINERALS) TABS tablet Take 1 tablet by mouth daily in the afternoon.     omeprazole (PRILOSEC) 40 MG capsule Take 1 capsule (40 mg total) by mouth daily. 30 capsule 5   potassium chloride SA (KLOR-CON) 20 MEQ tablet Take 1 tablet (20 mEq total) by mouth as needed (edema.  To be taken with lasix tablet.). Take with Lasix tablet. 30 tablet 2   rivaroxaban (XARELTO) 20 MG TABS tablet TAKE 1 TABLET BY MOUTH DAILY WITH SUPPER 30 tablet 5   tamsulosin (FLOMAX) 0.4 MG CAPS  capsule TAKE 1 CAPSULE (0.4 MG TOTAL) BY MOUTH DAILY. 30 capsule 3   traMADol (ULTRAM) 50 MG tablet Take 1 tablet (50 mg total) by mouth every 6 (six) hours as needed for moderate pain. 24 tablet 0   No current facility-administered medications for this visit.    Physical Exam  Vitals:   07/11/21 1205  BP: 97/66  Pulse: 73  Resp: 20  SpO2: 97%     General-[name] walked in to the office today and appears stable with his mobility. Heart-regular rate and rhythm, no murmur. Chest-no wheezing, CTA bilaterally and in all fields Extremities-bilateral lower ext edema R > L (vein harvest side), multiple superficial veins especially on his left lower leg. Toes with good capillary refill. Warm and well  perfused extremities.   Diagnostic Tests:  CLINICAL DATA:  Postop CABG.  History of pleural effusion.   EXAM: CHEST - 2 VIEW   COMPARISON:  Radiographs 06/27/2021 and CT 06/14/2021.   FINDINGS: The heart size and mediastinal contours are stable status post median sternotomy, CABG and clipping of the left atrial appendage. There is stable chronic volume loss in the left hemithorax with left perihilar scarring and a dependent left pleural effusion. The right lung is clear. There is no pneumothorax or acute osseous abnormality. Stable probable bone island in the T8 vertebral body.   IMPRESSION: Stable postoperative chest with chronic left perihilar scarring and left pleural effusion. No acute findings.     Electronically Signed   By: Richardean Sale M.D.   On: 07/11/2021 12:23     Electronically Signed   By: Genevie Ann M.D.   On: 06/27/2021 10:31   Impression / Plan:   [name] is about 8 weeks status post coronary bypass grafting after presenting with vessel coronary disease and normal ejection fraction.  He is progressing well overall.  He will need to continue with his aspirin and atorvastatin.  He was not started on beta-blocker due to hypotension postoperatively.   Left pleural effusion, recurrent-- he had left thoracentesis while in the hospital and this was repeated three weeks ago yielding about 1200 mL of amber fluid.  On today's chest x-ray he has a stable left pleural effusion that may be slightly smaller compared to his last CXR.   He reported to Radiology after his last appointment and this was documented: Patient presents for therapeutic and diagnostic right sided thoracentesis. [place] limited shows small amount of pleural fluid. Patient requested the procedure be deferred until such time as he is more symptomatic. Procedure not performed.  Postoperative atrial fibrillation-he remains in sinus rhythm on amiodarone 200 mg p.o. daily. He is followed by the Afib clinic. I  will let them determine stopping Amio when they see appropriate.   Lower extremity edema-he was on lasix but was told to stop it due to his kidney function. I don't see where his creatinine was above 1.0. His last creatinine was 0.86. I think he will need lasix long-term to control his lower extremity edema and fluid overload.   5.  Lower extremity numbness and feeling like his legs are "on fire". He is not a diabetic. This could be some venous insuffiencey, he has many superficial veins especially on his left lower leg.  There could also be a component of restless leg syndrome since it usually happens at night. I will leave this up to his PCP to work up. He sees them Friday.  Plan: Follow-up in 3 weeks to discuss clearance for work.  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 I would like to see him one more time before clearance to make sure some of these issues are resolving.   Nicholes Rough, PA-C [phone]

## 2021-07-12 DIAGNOSIS — Z48812 Encounter for surgical aftercare following surgery on the circulatory system: Secondary | ICD-10-CM | POA: Diagnosis not present

## 2021-07-12 DIAGNOSIS — Z951 Presence of aortocoronary bypass graft: Secondary | ICD-10-CM | POA: Diagnosis not present

## 2021-07-13 ENCOUNTER — Other Ambulatory Visit: Payer: Self-pay

## 2021-07-13 ENCOUNTER — Other Ambulatory Visit (HOSPITAL_BASED_OUTPATIENT_CLINIC_OR_DEPARTMENT_OTHER): Payer: Self-pay

## 2021-07-13 ENCOUNTER — Ambulatory Visit (INDEPENDENT_AMBULATORY_CARE_PROVIDER_SITE_OTHER): Payer: Medicare Other | Admitting: Family

## 2021-07-13 VITALS — BP 122/64 | HR 66 | Temp 98.1°F | Resp 16 | Wt 164.0 lb

## 2021-07-13 DIAGNOSIS — E119 Type 2 diabetes mellitus without complications: Secondary | ICD-10-CM

## 2021-07-13 DIAGNOSIS — I25110 Atherosclerotic heart disease of native coronary artery with unstable angina pectoris: Secondary | ICD-10-CM | POA: Diagnosis not present

## 2021-07-13 DIAGNOSIS — Z23 Encounter for immunization: Secondary | ICD-10-CM

## 2021-07-13 DIAGNOSIS — U071 COVID-19: Secondary | ICD-10-CM | POA: Diagnosis not present

## 2021-07-13 DIAGNOSIS — J449 Chronic obstructive pulmonary disease, unspecified: Secondary | ICD-10-CM | POA: Diagnosis not present

## 2021-07-13 DIAGNOSIS — E785 Hyperlipidemia, unspecified: Secondary | ICD-10-CM

## 2021-07-13 DIAGNOSIS — I10 Essential (primary) hypertension: Secondary | ICD-10-CM | POA: Diagnosis not present

## 2021-07-13 LAB — LIPID PANEL
Cholesterol: 140 mg/dL (ref 0–200)
HDL: 67 mg/dL (ref >39.00)
LDL Cholesterol: 58 mg/dL (ref 0–99)
NonHDL: 72.56
Total CHOL/HDL Ratio: 2
Triglycerides: 72 mg/dL (ref 0.0–149.0)
VLDL: 14.4 mg/dL (ref 0.0–40.0)

## 2021-07-13 LAB — HEMOGLOBIN A1C: Hgb A1c MFr Bld: 5.8 % (ref 4.6–6.5)

## 2021-07-13 NOTE — Progress Notes (Signed)
Subjective:   By signing my name below, I, Robert Bates, attest that this documentation has been prepared under the direction and in the presence of Robert Alar, NP, 07/13/2021   Patient ID: Robert Bates, male    DOB: May 03, 1941, 80 y.o.   MRN: [id number]  Chief Complaint  Patient presents with   Diabetes    Here for follow up    HPI Patient is in today for an office visit.  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 He was also diagnosed with Covid-19 and sent home to recover from Covid-19. On 05/08/2021 he had a coronary artery bypass graft procedure to repair 3 vessels in his heart. He was sent home on 05/21/2021 after 13 days of recovery and physical therapy post-op.  Denies: He denies chest pain and heart burn. Medication: He is currently taking an inhaler that he is unaware of what it is. He will update the medication at a later date. He is prescribed an albuterol inhaler for emergency use, but has not taken it due to worry of addiction.  Vaccinations: He has 4 Covid-19 vaccinations.  Smoking: He was a prior smoker but currently does not smoke. Notes occasional cigar.   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   COPD (chronic obstructive pulmonary disease) (HCC)    Coronary artery disease    Diabetes type 2, controlled (Robert Bates) 07/31/2017   Dyspnea    Dysrhythmia    a fib   GERD (gastroesophageal reflux disease)    Hematuria    refuses work up or referral  - understands risks of morbidity / mortality - 11/2008, 12/2008   History of hiatal hernia    History of kidney stones    Hyperlipemia    Meningioma (Robert Bates) 10/25/2013   Follows with Dr. [name].    Peripheral vascular disease (Robert Bates)    Abdominal Aortic Aneursym   Pneumonia    as a child   Radiation 09/18/15-10/25/15   left lower lobe 70.2 Gy   Seizures (Robert Bates) 02/18/2020   Tobacco abuse     Past Surgical History:  Procedure Laterality Date   CHOLECYSTECTOMY N/A 07/23/2017   Procedure: LAPAROSCOPIC CHOLECYSTECTOMY;  Surgeon: Mickeal Skinner, MD;  Location: [facility];  Service: General;  Laterality: N/A;   CLIPPING OF ATRIAL APPENDAGE Left 05/08/2021   Procedure: CLIPPING OF ATRIAL APPENDAGE USING 36 ATRICLIP;  Surgeon: Robert Olds, MD;  Location: [facility];  Service: Open Heart Surgery;  Laterality: Left;   COLONOSCOPY     CORONARY ARTERY BYPASS GRAFT N/A 05/08/2021   Procedure: CORONARY ARTERY BYPASS GRAFTING (CABG)X 3 USING LEFT INTERNAL MAMMARY ARTERY AND RIGHT GREATER SAPEHNOUS VEIN;  Surgeon: Robert Olds, MD;  Location: [facility];  Service: Open Heart Surgery;  Laterality: N/A;   ENDOVEIN HARVEST OF GREATER SAPHENOUS VEIN Right 05/08/2021   Procedure: ENDOVEIN HARVEST OF GREATER SAPHENOUS VEIN;  Surgeon: Robert Olds, MD;  Location: [facility]  OR;  Service: Open Heart Surgery;  Laterality: Right;   EYE SURGERY Bilateral    Cataracts removed w/ lens implant   HERNIA REPAIR     Left 36 years ago . Right inguinal hernia repair 10-01-17 Dr. Kieth Brightly   INGUINAL HERNIA REPAIR Right 10/01/2017   Procedure: RIGHT INGUINAL HERNIA REPAIR WITH MESH;  Surgeon: Kinsinger, Arta Bruce, MD;  Location: [facility];  Service: General;  Laterality: Right;  TAP BLOCK   INSERTION OF MESH Right 10/01/2017   Procedure: INSERTION OF MESH;  Surgeon: Kieth Brightly Arta Bruce, MD;  Location: [facility];  Service: General;  Laterality: Right;   IR THORACENTESIS ASP PLEURAL SPACE W/IMG GUIDE  05/18/2021   IR  THORACENTESIS ASP PLEURAL SPACE W/IMG GUIDE  06/07/2021   LEFT HEART CATH AND CORONARY ANGIOGRAPHY N/A 04/20/2021   Procedure: LEFT HEART CATH AND CORONARY ANGIOGRAPHY;  Surgeon: Robert Hampshire, MD;  Location: [facility];  Service: Cardiovascular;  Laterality: N/A;   TEE WITHOUT CARDIOVERSION N/A 05/08/2021   Procedure: TRANSESOPHAGEAL ECHOCARDIOGRAM (TEE);  Surgeon: Robert Olds, MD;  Location: [place] [facility];  Service: Open Heart Surgery;  Laterality: N/A;   TONSILLECTOMY     TONSILLECTOMY     VIDEO BRONCHOSCOPY Bilateral 07/26/2015   Procedure: VIDEO BRONCHOSCOPY WITH FLUORO;  Surgeon: Robert Rockers, MD;  Location: [facility];  Service: Cardiopulmonary;  Laterality: Bilateral;   VIDEO BRONCHOSCOPY WITH ENDOBRONCHIAL NAVIGATION N/A 08/23/2015   Procedure: VIDEO BRONCHOSCOPY WITH ENDOBRONCHIAL NAVIGATION;  Surgeon: Robert Isaac, MD;  Location: [facility];  Service: Thoracic;  Laterality: N/A;   VIDEO BRONCHOSCOPY WITH ENDOBRONCHIAL ULTRASOUND N/A 08/23/2015   Procedure: VIDEO BRONCHOSCOPY WITH ENDOBRONCHIAL ULTRASOUND;  Surgeon: Robert Isaac, MD;  Location: [facility];  Service: Thoracic;  Laterality: N/A;    Family History  Problem Relation Age of Onset   Leukemia Father    Emphysema Father    Learning disabilities Son    Leukemia Other    Stroke Other     Social History   Socioeconomic History   Marital status: Married    Spouse name: Not on file   Number of children: 2   Years of education: Not on file   Highest education level: Not on file  Occupational History   Occupation: Retired    Fish farm manager: [employer]    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[employer]  Tobacco Use   Smoking status: Former    Packs/day: 1.00    Years: 57.00    Pack years: 57.00    Types: Cigarettes, Cigars    Quit date: 08/08/2015    Years since quitting: 5.9   Smokeless tobacco: Former    Types: Chew    Quit date: 11/04/1958   Tobacco comments:    occasional use sneaks around  Vaping Use    Vaping Use: Former  Substance and Sexual Activity   Alcohol use: Not Currently    Alcohol/week: 0.0 standard drinks   Drug use: No   Sexual activity: Yes  Other Topics Concern   Not on file  Social History Narrative   Not on file   Social Determinants of Health   Financial Resource Strain: Medium Risk   Difficulty of Paying Living Expenses: Somewhat hard  Food Insecurity: Not on file  Transportation Needs: No Transportation Needs   Lack of Transportation (Medical): No   Lack of Transportation (Non-Medical): No  Physical Activity: Inactive   Days of Exercise per Week: 0 days   Minutes of Exercise per Session:  0 min  Stress: Not on file  Social Connections: Not on file  Intimate Partner Violence: Not on file    Outpatient Medications Prior to Visit  Medication Sig Dispense Refill   acetaminophen (TYLENOL) 500 MG tablet Take 1-2 tablets (500-1,000 mg total) by mouth every 6 (six) hours as needed. 30 tablet 0   albuterol (VENTOLIN HFA) 108 (90 Base) MCG/ACT inhaler Inhale 2 puffs into the lungs every 6 (six) hours as needed for wheezing or shortness of breath. 8.5 g 5   amiodarone (PACERONE) 200 MG tablet Take 1 tablet (200 mg total) by mouth 2 (two) times daily for 5 days, then take tablet (200 mg) once daily thereafter (Patient taking differently: Take 200 mg by mouth daily.) 35 tablet 1   aspirin EC 81 MG tablet Take 1 tablet (81 mg total) by mouth in the morning. Swallow whole.     atorvastatin (LIPITOR) 80 MG tablet Take 1 tablet (80 mg total) by mouth daily. 90 tablet 1   fluticasone-salmeterol (ADVAIR HFA) 115-21 MCG/ACT inhaler Inhale 2 puffs into the lungs 2 (two) times daily.     mometasone-formoterol (DULERA) 200-5 MCG/ACT AERO Inhale 2 puffs into the lungs 2 (two) times daily.     Multiple Vitamin (MULTIVITAMIN WITH MINERALS) TABS tablet Take 1 tablet by mouth daily in the afternoon.     omeprazole (PRILOSEC) 40 MG capsule Take 1 capsule (40 mg total) by mouth daily.  30 capsule 5   potassium chloride SA (KLOR-CON) 20 MEQ tablet Take 20 mEq by mouth. Take 1 tablet (20 mEq total) by mouth as needed (with lasix tablet.).     rivaroxaban (XARELTO) 20 MG TABS tablet TAKE 1 TABLET BY MOUTH DAILY WITH SUPPER 30 tablet 5   tamsulosin (FLOMAX) 0.4 MG CAPS capsule TAKE 1 CAPSULE (0.4 MG TOTAL) BY MOUTH DAILY. 30 capsule 3   furosemide (LASIX) 20 MG tablet Take 20 mg by mouth daily as needed.     traMADol (ULTRAM) 50 MG tablet Take 1 tablet (50 mg total) by mouth every 6 (six) hours as needed for moderate pain. 20 tablet 0   No facility-administered medications prior to visit.    Allergies  Allergen Reactions   Iodine Other (See Comments)    neck swells   Iohexol Swelling    Neck and gland swelling per patient.    Review of Systems  Neurological:  Positive for tremors (In hands when writing).      Objective:    Physical Exam Constitutional:      General: He is not in acute distress.    Appearance: Normal appearance. He is not ill-appearing.  HENT:     Head: Normocephalic and atraumatic.     Right Ear: External ear normal.     Left Ear: External ear normal.  Eyes:     Extraocular Movements: Extraocular movements intact.     Pupils: Pupils are equal, round, and reactive to light.  Cardiovascular:     Rate and Rhythm: Normal rate and regular rhythm.     Heart sounds: Normal heart sounds. No murmur heard.   No gallop.  Pulmonary:     Effort: Pulmonary effort is normal. No respiratory distress.     Breath sounds: Normal breath sounds. No wheezing or rales.  Skin:    General: Skin is warm and dry.  Neurological:     Mental Status: He is alert and oriented to person, place, and time.  Psychiatric:        Behavior: Behavior  normal.        Judgment: Judgment normal.    BP 122/64 (BP Location: Right Arm, Patient Position: Sitting, Cuff Size: Small)   Pulse 66   Temp 98.1 F (36.7 C) (Oral)   Resp 16   Wt 164 lb (74.4 kg)   SpO2 99%   BMI  24.22 kg/m  Wt Readings from Last 3 Encounters:  07/13/21 164 lb (74.4 kg)  07/11/21 165 lb (74.8 kg)  06/28/21 165 lb (74.8 kg)       Assessment & Plan:   Problem List Items Addressed This Visit       Unprioritized   Hyperlipidemia - Primary    Lab Results  Component Value Date   CHOL 146 04/11/2021   HDL 63 04/11/2021   LDLCALC 62 04/11/2021   LDLDIRECT 142.2 12/26/2008   TRIG 119 04/11/2021   CHOLHDL 2.3 04/11/2021  Maintained on atorvastatin 80mg .        Relevant Orders   Lipid panel (Completed)   HTN (hypertension)    BP Readings from Last 3 Encounters:  07/13/21 122/64  07/11/21 97/66  06/28/21 109/69  BP stable.        Diabetes type 2, controlled ([place])    Lab Results  Component Value Date   HGBA1C 6.5 04/11/2021        Relevant Orders   Hemoglobin A1c (Completed)   COVID-19 virus infection    Clinically resolved.       Coronary artery disease    S/p cabg x 3.  Doing well post op. Recommended that he discontinue use of cigars.       COPD GOLD II     Stable. Maintained on ? advair 115 mcg 2 puffs bid versus dulera- I advised the patient to call us and let us know which one he is taking.  Not needing albuterol.       Other Visit Diagnoses     Needs flu shot       Relevant Orders   Flu Vaccine QUAD High Dose(Fluad) (Completed)       No orders of the defined types were placed in this encounter.   I, Robert Alar, NP, personally preformed the services described in this documentation.  All medical record entries made by the scribe were at my direction and in my presence.  I have reviewed the chart and discharge instructions (if applicable) and agree that the record reflects my personal performance and is accurate and complete. 07/13/2021  I,Robert Bates,acting as a Education administrator for Nance Pear, NP.,have documented all relevant documentation on the behalf of Nance Pear, NP,as directed by  Nance Pear, NP while  in the presence of Nance Pear, NP.    Nance Pear, NP

## 2021-07-13 NOTE — Assessment & Plan Note (Signed)
Lab Results  Component Value Date   HGBA1C 6.5 04/11/2021

## 2021-07-13 NOTE — Assessment & Plan Note (Addendum)
Stable. Maintained on ? advair 115 mcg 2 puffs bid versus dulera- I advised the patient to call us and let us know which one he is taking.  Not needing albuterol.

## 2021-07-13 NOTE — Assessment & Plan Note (Addendum)
Lab Results  Component Value Date   CHOL 146 04/11/2021   HDL 63 04/11/2021   LDLCALC 62 04/11/2021   LDLDIRECT 142.2 12/26/2008   TRIG 119 04/11/2021   CHOLHDL 2.3 04/11/2021   Maintained on atorvastatin 80mg .

## 2021-07-13 NOTE — Patient Instructions (Signed)
Please complete lab work prior to leaving.   

## 2021-07-13 NOTE — Assessment & Plan Note (Signed)
Clinically resolved.  

## 2021-07-14 NOTE — Assessment & Plan Note (Signed)
BP Readings from Last 3 Encounters:  07/13/21 122/64  07/11/21 97/66  06/28/21 109/69   BP stable.

## 2021-07-14 NOTE — Assessment & Plan Note (Signed)
S/p cabg x 3.  Doing well post op. Recommended that he discontinue use of cigars.

## 2021-07-16 DIAGNOSIS — Z951 Presence of aortocoronary bypass graft: Secondary | ICD-10-CM | POA: Diagnosis not present

## 2021-07-16 DIAGNOSIS — Z48812 Encounter for surgical aftercare following surgery on the circulatory system: Secondary | ICD-10-CM | POA: Diagnosis not present

## 2021-07-18 DIAGNOSIS — Z951 Presence of aortocoronary bypass graft: Secondary | ICD-10-CM | POA: Diagnosis not present

## 2021-07-18 DIAGNOSIS — Z48812 Encounter for surgical aftercare following surgery on the circulatory system: Secondary | ICD-10-CM | POA: Diagnosis not present

## 2021-07-19 DIAGNOSIS — Z48812 Encounter for surgical aftercare following surgery on the circulatory system: Secondary | ICD-10-CM | POA: Diagnosis not present

## 2021-07-19 DIAGNOSIS — Z951 Presence of aortocoronary bypass graft: Secondary | ICD-10-CM | POA: Diagnosis not present

## 2021-07-20 ENCOUNTER — Ambulatory Visit (INDEPENDENT_AMBULATORY_CARE_PROVIDER_SITE_OTHER): Payer: Medicare Other | Admitting: Physician Assistant

## 2021-07-20 ENCOUNTER — Other Ambulatory Visit (HOSPITAL_BASED_OUTPATIENT_CLINIC_OR_DEPARTMENT_OTHER): Payer: Self-pay

## 2021-07-20 ENCOUNTER — Encounter: Payer: Self-pay | Admitting: Physician Assistant

## 2021-07-20 ENCOUNTER — Other Ambulatory Visit: Payer: Self-pay

## 2021-07-20 VITALS — BP 120/66 | HR 67 | Ht 69.0 in | Wt 165.8 lb

## 2021-07-20 DIAGNOSIS — I4819 Other persistent atrial fibrillation: Secondary | ICD-10-CM

## 2021-07-20 DIAGNOSIS — I712 Thoracic aortic aneurysm, without rupture, unspecified: Secondary | ICD-10-CM

## 2021-07-20 DIAGNOSIS — E782 Mixed hyperlipidemia: Secondary | ICD-10-CM | POA: Diagnosis not present

## 2021-07-20 DIAGNOSIS — I2510 Atherosclerotic heart disease of native coronary artery without angina pectoris: Secondary | ICD-10-CM

## 2021-07-20 DIAGNOSIS — I5032 Chronic diastolic (congestive) heart failure: Secondary | ICD-10-CM | POA: Diagnosis not present

## 2021-07-20 DIAGNOSIS — I10 Essential (primary) hypertension: Secondary | ICD-10-CM | POA: Diagnosis not present

## 2021-07-20 DIAGNOSIS — J90 Pleural effusion, not elsewhere classified: Secondary | ICD-10-CM

## 2021-07-20 MED ORDER — POTASSIUM CHLORIDE ER 10 MEQ PO TBCR
10.0000 meq | EXTENDED_RELEASE_TABLET | Freq: Every day | ORAL | 3 refills | Status: DC
  Filled 2021-07-20: qty 90, 90d supply, fill #0
  Filled 2021-11-22: qty 90, 90d supply, fill #1
  Filled 2022-02-14 (×2): qty 90, 90d supply, fill #2

## 2021-07-20 MED ORDER — FUROSEMIDE 20 MG PO TABS
20.0000 mg | ORAL_TABLET | Freq: Every day | ORAL | 3 refills | Status: DC
  Filled 2021-07-20: qty 90, 90d supply, fill #0
  Filled 2021-11-22: qty 90, 90d supply, fill #1
  Filled 2022-02-14 (×2): qty 90, 90d supply, fill #2

## 2021-07-20 NOTE — Patient Instructions (Signed)
Medication Instructions:   RESTART Lasix one tablet by mouth ( 20 mg) daily.   RESTART K-dur ( potassium) one tablet by mouth ( 10 meq) daily.   *If you need a refill on your cardiac medications before your next appointment, please call your pharmacy*   Lab Work: Your physician recommends that you return for lab work on Friday, September 30 between 7:30 - 4:30 NO fasting.   If you have labs (blood work) drawn today and your tests are completely normal, you will receive your results only by: [place] (if you have MyChart) OR A paper copy in the mail If you have any lab test that is abnormal or we need to change your treatment, we will call you to review the results.   Testing/Procedures:  NONE   Follow-Up: At [facility], you and your health needs are our priority.  As part of our continuing mission to provide you with exceptional heart care, we have created designated Provider Care Teams.  These Care Teams include your primary Cardiologist (physician) and Advanced Practice Providers (APPs -  Physician Assistants and Nurse Practitioners) who all work together to provide you with the care you need, when you need it.  We recommend signing up for the patient portal called "MyChart".  Sign up information is provided on this After Visit Summary.  MyChart is used to connect with patients for Virtual Visits (Telemedicine).  Patients are able to view lab/test results, encounter notes, upcoming appointments, etc.  Non-urgent messages can be sent to your provider as well.   To learn more about what you can do with MyChart, go to [email or web address].    Your next appointment:   6 month(s)  The format for your next appointment:   In Person  Provider:   Richardson Dopp, PA-C   Other Instructions  It is OK to discontinue Asprin October 31 if it is OK with your surgeon.

## 2021-07-20 NOTE — Progress Notes (Signed)
Cardiology Office Note:   Date:  07/20/2021   ID:  Robert Bates, DOB 10-16-1941, MRN [id number]  PCP:  Robert Alar, NP  [facility] Providers Cardiologist:  Robert Moores, MD Cardiology APP:  Robert Bates      Referring MD: Robert Alar, NP   Chief Complaint:  F/u for CAD   Patient Profile:   Robert Bates is a 80 y.o. male with:  Coronary artery disease S/p CABG 05/2021 (L-LAD, S-OM, S-Dx; LA clipping) C/b recurrent pleural effusion - thoracentesis x 2  Paroxysmal atrial fibrillation  S/p L atral clipping 05/2021 (HFpEF) heart failure with preserved ejection fraction  Lung CA, Stage IIB (Robert Bates) COPD Abdominal aortic aneurysm  Hypertension  Hyperlipidemia  Borderline diabetes mellitus  Peripheral arterial disease  Prior ETOH abuse  S/p cholecystectomy  Aortic atherosclerosis  Thoracic aortic aneurysm CT 6/22: 3.9 cm    Prior CV studies: Pre-CABG [place] 04/25/21 Bilateral ICA 1-39   Echocardiogram 04/22/21 EF 60-65, no RWMA, GRII DD, mildly reduced RVSF, RVSP 41, trivial MR, mild dilation of the ascending aorta (40 mm)   LEFT HEART CATH AND CORONARY ANGIOGRAPHY 04/20/2021  Mid LM to Dist LM lesion is 70% stenosed.  Ost Cx to Prox Cx lesion is 70% stenosed.  Mid Cx lesion is 90% stenosed.  3rd Mrg lesion is 100% stenosed.  Ost LAD to Prox LAD lesion is 80% stenosed.  1st Diag lesion is 70% stenosed.  Ramus lesion is 60% stenosed.   CT CORONARY MORPH W/CTA COR W/SCORE W/CA W/CM &/OR WO/CM 04/10/2021 IMPRESSION: 1. Coronary calcium score of 1231. This was 76th percentile for age and sex matched control. 2.  Normal coronary origin with right dominance. 3. Severe atherosclerosis involving the moderate disease of the mid to distal LM and severe atherosclerosis in the LAD and LCx. CAD RADS 4b. IMPRESSION: 1. Stable large hiatal hernia. 2. Stable top-normal ascending thoracic aorta measuring approximately 3.9 cm in greatest diameter. 3.  Stable soft tissue thickening in the left perihilar lung presumably related to prior treatment of lung carcinoma. 4. Stable focal sclerosis within the T8 vertebral body presumably representing a sclerotic bone island.   Echo 07/22/17 EF 55-60, no RWMA, Gr 2 DD, mild RAE, PASP 38   History of Present Illness: Robert Bates was last seen in 06/2021 for posthospitalization follow-up.   He had undergone CABG in July.  Postop course was complicated by blood loss anemia requiring transfusion, atrial fibrillation with rapid rate and left pleural effusion requiring thoracentesis.  He required amiodarone and anticoagulation with rivaroxaban.  He had another thoracentesis post DC due to recurrent pleural effusion.  He has had some reaccumulation of pleural fluid.  There was an attempt at thoracentesis 07/03/2021.  However, there was just minimal fluid on ultrasound and the patient declined repeat procedure.  His most recent chest x-ray showed stable postoperative changes with chronic scarring and left pleural effusion.  He has another follow-up with cardiothoracic surgery next month.  He returns for cardiology follow-up.  He returns for f/u.  Overall his breathing is stable.  He was a little more short of breath at cardiac rehabilitation yesterday.  He has not had orthopnea, syncope, chest pain.  His R leg still swells from time to time.  It is fairly swollen today.  He is not on furosemide anymore.      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   COPD (chronic obstructive pulmonary disease) (HCC)    Coronary artery disease    Diabetes type 2, controlled ([place]) 07/31/2017   Dyspnea    Dysrhythmia    a fib   GERD (gastroesophageal reflux disease)    Hematuria    refuses work up or referral - understands risks of morbidity / mortality - 11/2008, 12/2008   History  of hiatal hernia    History of kidney stones    Hyperlipemia    Meningioma ([place]) 10/25/2013   Follows with Dr. Ashok Bates.    Peripheral vascular disease ([place])    Abdominal Aortic Aneursym   Pneumonia    as a child   Radiation 09/18/15-10/25/15   left lower lobe 70.2 Gy   Seizures ([place]) 02/18/2020   Tobacco abuse    Current Medications: Current Meds  Medication Sig   acetaminophen (TYLENOL) 500 MG tablet Take 1-2 tablets (500-1,000 mg total) by mouth every 6 (six) hours as needed.   albuterol (VENTOLIN HFA) 108 (90 Base) MCG/ACT inhaler Inhale 2 puffs into the lungs every 6 (six) hours as needed for wheezing or shortness of breath.   amiodarone (PACERONE) 200 MG tablet Take 1 tablet (200 mg total) by mouth 2 (two) times daily for 5 days, then take tablet (200 mg) once daily thereafter (Patient taking differently: Take 200 mg by mouth daily.)   aspirin EC 81 MG tablet Take 1 tablet (81 mg total) by mouth in the morning. Swallow whole.   atorvastatin (LIPITOR) 80 MG tablet Take 1 tablet (80 mg total) by mouth daily.   fluticasone-salmeterol (ADVAIR HFA) 115-21 MCG/ACT inhaler Inhale 2 puffs into the lungs 2 (two) times daily.   furosemide (LASIX) 20 MG tablet Take 1 tablet (20 mg total) by mouth daily.   mometasone-formoterol (DULERA) 200-5 MCG/ACT AERO Inhale 2 puffs into the lungs 2 (two) times daily.   Multiple Vitamin (MULTIVITAMIN WITH MINERALS) TABS tablet Take 1 tablet by mouth daily in the afternoon.   omeprazole (PRILOSEC) 40 MG capsule Take 1 capsule (40 mg total) by mouth daily.   potassium chloride (KLOR-CON) 10 MEQ tablet Take 1 tablet (10 mEq total) by mouth daily.   potassium chloride SA (KLOR-CON) 20 MEQ tablet Take 20 mEq by mouth. Take 1 tablet (20 mEq total) by mouth as needed (with lasix tablet.).   rivaroxaban (XARELTO) 20 MG TABS tablet TAKE 1 TABLET BY MOUTH DAILY WITH SUPPER   tamsulosin (FLOMAX) 0.4 MG CAPS capsule TAKE 1 CAPSULE (0.4 MG TOTAL) BY MOUTH DAILY.     Allergies:   Iodine and Iohexol   Social History   Tobacco Use   Smoking status: Former    Packs/day: 1.00    Years: 57.00    Pack years: 57.00    Types: Cigarettes, Cigars    Quit date: 08/08/2015    Years since quitting: 5.9   Smokeless tobacco: Former    Types: Chew    Quit date: 11/04/1958   Tobacco comments:    occasional use sneaks around  Vaping Use   Vaping Use: Former  Substance Use Topics   Alcohol use: Not Currently    Alcohol/week: 0.0 standard drinks   Drug use: No    Family Hx: The patient's family history includes Emphysema in his father; Learning disabilities in his son; Leukemia in his father and another family member; Stroke in an other family member.  Review of Systems  Gastrointestinal:  Negative for hematochezia.  Genitourinary:  Negative for hematuria.    EKGs/Labs/Other Test Reviewed:    EKG:  EKG is   ordered today.  The ekg ordered today demonstrates NSR, HR 67, leftward axis, low voltage, poor wave progression, PACs, QTC 424, nonspecific ST-T wave changes  Recent Labs: 05/20/2021: Magnesium 2.0 06/14/2021: ALT 13; BUN 19; Creatinine 0.86; Hemoglobin 10.4; Platelet Count 247; Potassium 4.0; Sodium 140   Recent Lipid Panel Lab Results  Component Value Date/Time   CHOL 140 07/13/2021 01:36 PM   TRIG 72.0 07/13/2021 01:36 PM   HDL 67.00 07/13/2021 01:36 PM   LDLCALC 58 07/13/2021 01:36 PM   LDLCALC 62 04/11/2021 02:21 PM   LDLDIRECT 142.2 12/26/2008 10:32 AM     Risk Assessment/Calculations:    CHA2DS2-VASc Score = 5   This indicates a 7.2% annual risk of stroke. The patient's score is based upon: CHF History: 1 HTN History: 1 Diabetes History: 0 Stroke History: 0 Vascular Disease History: 1 Age Score: 2 Gender Score: 0         Physical Exam:    VS:  BP 120/66 (BP Location: Right Arm, Patient Position: Sitting, Cuff Size: Normal)   Pulse 67   Ht 5\' 9"  (1.753 m)   Wt 165 lb 12.8 oz (75.2 kg)   SpO2 98%   BMI 24.48 kg/m      Wt Readings from Last 3 Encounters:  07/20/21 165 lb 12.8 oz (75.2 kg)  07/13/21 164 lb (74.4 kg)  07/11/21 165 lb (74.8 kg)    Constitutional:      Appearance: Healthy appearance. Not in distress.  Neck:     Vascular: JVD normal.  Pulmonary:     Effort: Pulmonary effort is normal.     Breath sounds: No wheezing. Rales (crackles/decrease BS L base) present.  Cardiovascular:     Normal rate. Regular rhythm. Normal S1. Normal S2.      Murmurs: There is no murmur.  Edema:    Peripheral edema present.    Pretibial: 1+ edema of the right pretibial area. Abdominal:     General: There is no distension.     Palpations: Abdomen is soft.  Skin:    General: Skin is warm and dry.  Neurological:     General: No focal deficit present.     Mental Status: Alert and oriented to person, place and time.     Cranial Nerves: Cranial nerves are intact.     ASSESSMENT & PLAN:   1. Coronary artery disease involving native coronary artery of native heart without angina pectoris S/p CABG.  He is overall doing well without angina.  He has been attending cardiac rehabilitation.  He requires long term anticoagulation for atrial fibrillation.  He should be able to DC ASA > 3 mos out from CABG.  I have asked him to review this with his surgeon at f/u to make sure he has no objections to stopping it at that time.  Continue atorvastatin 80 mg daily.  2. Persistent atrial fibrillation (HCC) CHA2DS2-VASc Score = 5 [CHF History: 1, HTN History: 1, Diabetes History: 0, Stroke History: 0, Vascular Disease History: 1, Age Score: 2, Gender Score: 0].  Therefore, the patient's annual risk of stroke is 7.2 %.    Maintaining sinus rhythm.  Continue amiodarone 200 mg daily, rivaroxaban 20 mg daily.  Creatinine clearance 73.  Recent hemoglobin was stable.  Recent ALT normal.  Arrange follow-up TSH.  3. Chronic heart failure with preserved ejection fraction (HCC) EF  60-65 with moderate diastolic dysfunction on  echocardiogram in 6/22.  I still think he has some excess volume.  His  R leg remains swollen.  He has some crackles in his left base.  This is likely related to residual effusion.  I have asked him to resume furosemide 20 mg daily and K+ 10 mEq daily.  Obtain BMET in 2 weeks.  4. Essential hypertension Blood pressure is well controlled.  He does not take any blood pressure medications at this time.  5. Mixed hyperlipidemia LDL optimal.  Continue atorvastatin 80 mg daily.  6. Thoracic aortic aneurysm without rupture (HCC) 3.9 cm by CT in 6/22.  At next visit, arrange follow-up chest CTA.  7. Recurrent left pleural effusion Followed closely by cardiothoracic surgery.  He has undergone thoracentesis x2.  He has follow-up again in a couple of weeks.  Dispo:  Return in about 6 months (around 01/17/2022) for Routine Follow Up, w/ Dr. Acie Fredrickson, or Richardson Dopp, PA-C.   Medication Adjustments/Labs and Tests Ordered: Current medicines are reviewed at length with the patient today.  Concerns regarding medicines are outlined above.  Tests Ordered: Orders Placed This Encounter  Procedures   Basic Metabolic Panel (BMET)   TSH   EKG 12-Lead   Medication Changes: Meds ordered this encounter  Medications   furosemide (LASIX) 20 MG tablet    Sig: Take 1 tablet (20 mg total) by mouth daily.    Dispense:  90 tablet    Refill:  3   potassium chloride (KLOR-CON) 10 MEQ tablet    Sig: Take 1 tablet (10 mEq total) by mouth daily.    Dispense:  90 tablet    Refill:  3   Signed, Richardson Dopp, PA-C  07/20/2021 12:52 PM    [facility] [address] [place], [place]  [address] Phone: [phone]; Fax: [phone]

## 2021-07-25 DIAGNOSIS — Z48812 Encounter for surgical aftercare following surgery on the circulatory system: Secondary | ICD-10-CM | POA: Diagnosis not present

## 2021-07-25 DIAGNOSIS — Z951 Presence of aortocoronary bypass graft: Secondary | ICD-10-CM | POA: Diagnosis not present

## 2021-07-26 ENCOUNTER — Other Ambulatory Visit (HOSPITAL_BASED_OUTPATIENT_CLINIC_OR_DEPARTMENT_OTHER): Payer: Self-pay

## 2021-07-26 ENCOUNTER — Telehealth: Payer: Self-pay | Admitting: Pharmacist

## 2021-07-26 ENCOUNTER — Ambulatory Visit (INDEPENDENT_AMBULATORY_CARE_PROVIDER_SITE_OTHER): Payer: Medicare Other | Admitting: Pharmacist

## 2021-07-26 DIAGNOSIS — Z48812 Encounter for surgical aftercare following surgery on the circulatory system: Secondary | ICD-10-CM | POA: Diagnosis not present

## 2021-07-26 DIAGNOSIS — E785 Hyperlipidemia, unspecified: Secondary | ICD-10-CM

## 2021-07-26 DIAGNOSIS — I25110 Atherosclerotic heart disease of native coronary artery with unstable angina pectoris: Secondary | ICD-10-CM

## 2021-07-26 DIAGNOSIS — I480 Paroxysmal atrial fibrillation: Secondary | ICD-10-CM

## 2021-07-26 DIAGNOSIS — J449 Chronic obstructive pulmonary disease, unspecified: Secondary | ICD-10-CM

## 2021-07-26 DIAGNOSIS — Z951 Presence of aortocoronary bypass graft: Secondary | ICD-10-CM | POA: Diagnosis not present

## 2021-07-26 NOTE — Chronic Care Management (AMB) (Signed)
Chronic Care Management Pharmacy Note  07/26/2021 Name:  Robert Bates MRN:  [id number] DOB:  1941-06-10  Summary;  Patient in medicare coverage gap and cost of xarelto and Advair is high. He has been mailed applications for patient assistance but has not completed yet. Urged patient to complete applications / get his wife to assist and return to office ASAP   Subjective: Robert Bates is an 80 y.o. year old male who is a primary patient of [name], NP.  The CCM team was consulted for assistance with disease management and care coordination needs.    Engaged with patient by telephone for follow up visit in response to provider referral for pharmacy case management and/or care coordination services.   Consent to Services:  The patient was given information about Chronic Care Management services, agreed to services, and gave verbal consent prior to initiation of services.  Please see initial visit note for detailed documentation.   Patient Care Team: [name], NP as PCP - General Nahser, Wonda Cheng, MD as PCP - Cardiology (Cardiology) Elsie Stain, MD as Attending Physician (Pulmonary Disease) Virgina Evener, OD (Optometry) Cherre Robins, PharmD (Pharmacist) Sharmon Revere as Physician Assistant (Cardiology)  Recent office visits: 07/13/2021 - PCP Inda Castle, NP) F/U chronic conditions. Removed furosemide from med list (patient preferance) and tramadols (completed course) 04/11/2021 - PCP Inda Castle, NP) F/U DM and HTN. Restarted Advair HFA 115/21 2 puffs twice a day; Started sildenafil 64m as needed for ED.  03/07/2021 - PCP (Inda Castle Acute visit for HA; HA started after hitting head on a beam on 03/04/2021; patient also reported chest pain on 5/3. CT head ordered due to taking Xarelto; CT showed no acute abnormalities and no change in 2 meningiomas previously present. Ordered EKG for CP - no acute changed noted / NSR;   Recent consult  visits: 07[facility]20/2021 [facility] s/p [facility]G 05/2021.  Continued Xarelto for Afib; Plan to stop aspirin 855m3 months from CABG (October 2022 - but pt to check with surgeon). Added furosemide 2064maily and potassium 10 mEq daily. Recheck BMP in 2 weeks.  07/11/2021 - [facility] with complecation of plural effusion and left thoracenteisi 05/18/2021 and 06/07/2021. Notes suggest restarting furosemide for LEE but not added back to med list.  06/28/2021 [facility] with complication of plural effusion. Xray orderd. Patient sent for thoracentesis. Recommended continue furosemide 49m51mily and potassium 20 mEq daily. 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 KinaSondra Comerrently on observation. Repeat CT scan of the chest performed recently showed no concerning findings for disease recurrence or metastasis.  The pleural effusion is secondary to his recent cardiac surgery. Recommended cont observation with repeat chest CT in 1 year 06/11/2021 - Cardio ([place], [facility]. Recommended continue furosemide 49mg70mly and potassium 20 mEq daily. Recheck in 2 weeks.  06/05/2021 - [facility] ([place]P [facility]. Continue Aspirin until 3 months post CABG, Stopped metoprolol due to lightheadedness and low HR. Started furosemide 49mg 55my for 7 days and as needed thereafter for weight gain > 3 lbs and potassium 20 mEq daily for 7 days and as needed when taking furosemide.  BMET checked and will be repeated  in 1 week.  06/04/2021 - Cardiothoracic surgery Harriet Pho, [facility]) F/U CABG and left plural effusion. Need thoracentesis on 05/18/2021 while in hospital.  Unable to give diuretic due to low BP. Ordered US guided thoracentesis with repeat xray in 1 week.  05/28/2021 - Cardio ([place], PAC) [facility]. Noted to be bradycardic. Recommended lower dose of metoprolol to 12.75m bid. No other med changes.  05/24/2021 - Cardiothoracic surgery (Dr AOrvan Seen F/U CABG. No med changes.  04/18/2021 - Cardio (Glenford Peers NP) Video Visit to discuss abnormal CCTA from 04/16/2021. Showed flow limiting lesions in the mLAD, mLCX and mid diag vessel. Ordered cardiac cath. since allergy to contact dye - premedication planned wiht prednisone for Q13, Q7 and A1h before cath and benadryl 222m1 our before cath. Instructed to hold Xarelto prior to cath. Removed levetiracetam from med list - patinet not taking.  03/12/2021 - Cardio (MGlenford PeersNP) Atypical CP. Ordered CCTA - added beta blocker for scan and also will premedicate with prednisone Q13, 7 and 1 hour before scan 2/2 h/o contract dye allergy; NTG added.   [facility]: [facility] for [facility] x 3 performed on 05/08/2021. Post Op blood loss anemia with transfusion on 07/6. Medications Started: cephalexin 50062mvery 8 hours  Dulera 200/5mc15m 2 puffs twice a day  Metoprolol tartrate 25mg35mce a day  Potassium chloride 10 mEq daily for 4 days  Torsemide 20mg 4m 1 tablet daily for 4 days Medications Stopped: Advair HFA 115/21 mcg and NTG SL Medication Changes: amiodarone 200mg b18mor 5 days, then once a day thereafter  04[facility]20/2021 to [facility] Showe significant disease. He was to have bypass surgery but was found to be COVID positive. He was treated with Paxlovid. He was kept in hospital for 5 days toremain on heparin drip since he has Afib. Will return to hospital for CABG 05/08/2021. New Medication: Atorvastatin 80mg; a40marone 200mg bid21m 7 days, then 200mg dail57mereafter; metfromin 500mg bid M79mation  stopped: pravastatin 80mg; dilti41m Medication changes: none  Objective:  Lab Results  Component Value Date   CREATININE 0.86 06/14/2021   CREATININE 0.86 06/05/2021   CREATININE 0.88 05/20/2021    Lab Results  Component Value Date   HGBA1C 5.8 07/13/2021   Last diabetic Eye exam: No results found for: HMDIABEYEEXA  Last diabetic Foot exam: No results found for: HMDIABFOOTEX      Component Value Date/Time   CHOL 140 07/13/2021 1336   TRIG 72.0 07/13/2021 1336   HDL 67.00 07/13/2021 1336   CHOLHDL 2 07/13/2021 1336   VLDL 14.4 07/13/2021 1336   LDLCALC 58 07/13/2021 1336   LDLCALC 62 04/11/2021 1421   LDLDIRECT 142.2 12/26/2008 1032    Hepatic Function Latest Ref Rng & Units 06/14/2021 05/04/2021 12/08/2020  Total Protein 6.5 - 8.1 g/dL 5.9(L) 5.9(L) 6.1  Albumin 3.5 - 5.0 g/dL 2.8(L) 3.3(L) -  AST 15 - 41 U/L 11(L) 16 13  ALT 0 - 44 U/L _0 Alk Phosphatase 38 - 126 U/L 103 80 -  Total Bilirubin 0.3 - 1.2 mg/dL 0.4 0.5 0.4  Bilirubin, Direct 0.0 - 0.3 mg/dL - - -    Lab Results  Component Value Date/Time   TSH 1.31 11/08/2019 02:55 PM   TSH 1.53 04/27/2018 03:09 PM    CBC Latest Ref Rng & Units 06/14/2021 05/17/2021 05/15/2021  WBC 4.0 - 10.5 K/uL 6.8 9.3 9.4  Hemoglobin 13.0 - 17.0 g/dL 10.4(L) 9.5(L) 9.5(L)  Hematocrit 39.0 - 52.0 % 32.4(L) 29.9(L) 29.7(L)  Platelets 150 - 400 K/uL 247 302 268    No results found for: VD25OH  Clinical ASCVD: Yes  The ASCVD Risk score (Arnett DK, et al., 2019) failed to calculate for the following reasons:   The 2019 ASCVD risk score is only valid for ages 55 to 39    Other: CHADS2VASc = 5   Social History   Tobacco Use  Smoking Status Former   Packs/day: 1.00   Years: 57.00   Pack years: 57.00   Types: Cigarettes, Cigars   Quit date: 08/08/2015   Years since quitting: 5.9  Smokeless Tobacco Former   Types: Chew   Quit date: 11/04/1958  Tobacco Comments   occasional use sneaks around   BP Readings from Last 3  Encounters:  07/20/21 120/66  07/13/21 122/64  07/11/21 97/66   Pulse Readings from Last 3 Encounters:  07/20/21 67  07/13/21 66  07/11/21 73   Wt Readings from Last 3 Encounters:  07/20/21 165 lb 12.8 oz (75.2 kg)  07/13/21 164 lb (74.4 kg)  07/11/21 165 lb (74.8 kg)    Assessment: Review of patient past medical history, allergies, medications, health status, including review of consultants reports, laboratory and other test data, was performed as part of comprehensive evaluation and provision of chronic care management services.   SDOH:  (Social Determinants of Health) assessments and interventions performed:  SDOH Interventions    Flowsheet Row Most Recent Value  SDOH Interventions   Financial Strain Interventions Other (Comment)  [patient's wife sent application for patient assistance but has not returned applications yet.]        CCM Care Plan  Allergies  Allergen Reactions   Iodine Other (See Comments)    neck swells   Iohexol Swelling    Neck and gland swelling per patient.    Medications Reviewed Today     Reviewed by Cherre Robins, PharmD (Pharmacist) on 07/26/21 at 1054  Med List Status: <None>   Medication Order Taking? Sig Documenting Provider Last Dose Status Informant  acetaminophen (TYLENOL) 500 MG tablet [id number] Yes Take 1-2 tablets (500-1,000 mg total) by mouth every 6 (six) hours as needed. Lars Pinks M, PA-C Taking Active   albuterol (VENTOLIN HFA) 108 (90 Base) MCG/ACT inhaler [id number] Yes Inhale 2 puffs into the lungs every 6 (six) hours as needed for wheezing or shortness of breath. [name], NP Taking Active Self  amiodarone (PACERONE) 200 MG tablet [id number] Yes Take 1 tablet (200 mg total) by mouth 2 (two) times daily for 5 days, then take tablet (200 mg) once daily thereafter  Patient taking differently: Take 200 mg by mouth daily.   Nani Skillern, PA-C Taking Active   aspirin EC 81 MG tablet [id number] Yes Take  1 tablet (81 mg total) by mouth in the morning. Swallow whole. Nani Skillern, PA-C Taking Active   atorvastatin (LIPITOR) 80 MG tablet [id number] Yes Take 1 tablet (80 mg total) by mouth daily. Margie Billet, NP Taking Active   fluticasone-salmeterol (ADVAIR HFA) 389-37 MCG/ACT inhaler [id number] Yes Inhale 2 puffs into the lungs 2 (two) times daily. [provider] Taking Active   furosemide (LASIX) 20 MG tablet [id number] Yes Take 1 tablet (20 mg total) by mouth daily. Richardson Dopp T, [place] Taking Active     Discontinued 05/08/21 1058 mometasone-formoterol (DULERA) 200-5 MCG/ACT AERO [id number] No Inhale 2 puffs into the lungs 2 (two)  times daily.  Patient not taking: Reported on 07/26/2021   [provider] Not Taking Consider Medication Status and Discontinue   Multiple Vitamin (MULTIVITAMIN WITH MINERALS) TABS tablet [id number] Yes Take 1 tablet by mouth daily in the afternoon. [provider] Taking Active   omeprazole (PRILOSEC) 40 MG capsule [id number] Yes Take 1 capsule (40 mg total) by mouth daily. [name], NP Taking Active Self  potassium chloride (KLOR-CON) 10 MEQ tablet [id number] Yes Take 1 tablet (10 mEq total) by mouth daily. Richardson Dopp T, PA-C Taking Active   rivaroxaban (XARELTO) 20 MG TABS tablet [id number] Yes TAKE 1 TABLET BY MOUTH DAILY WITH SUPPER Nahser, Wonda Cheng, MD Taking Active   tamsulosin (FLOMAX) 0.4 MG CAPS capsule [id number] Yes TAKE 1 CAPSULE (0.4 MG TOTAL) BY MOUTH DAILY. [name], NP Taking Active   Tiotropium Bromide Monohydrate (SPIRIVA RESPIMAT) 2.5 MCG/ACT AERS [id number] Yes Inhale 2 puffs into the lungs daily. 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) 02/18/2020   PAF (paroxysmal atrial fibrillation) ([place]) 08/15/2017   Diabetes type 2, controlled ([place]) 07/31/2017   COPD GOLD II  04/03/2017   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[place]) 09/06/2015   Lung cancer ([place]) 11/07/2014   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   HTN (hypertension) 04/29/2014   Meningioma ([place]) 10/25/2013   Low back pain 10/25/2013   Osteoarthritis 08/18/2012   Atypical chest pain 08/14/2011   KERATOSIS 10/09/2010   SCIATICA, RIGHT 10/09/2010   UNSPECIFIED HEARING LOSS 05/21/2010   Hyperlipidemia 05/14/2010   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   RENAL CYST, RIGHT 02/05/2010   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[place]) 01/29/2010   LIPOMA 11/17/2009   MICROSCOPIC HEMATURIA 08/11/2008   GERD 07/26/2008   DERMOID CYST 07/25/2008    Immunization History  Administered Date(s) Administered   Fluad Quad(high Dose 65+) 07/23/2019, 09/04/2020, 07/13/2021   Influenza Split 08/02/2011, 08/18/2012   Influenza Whole 08/17/2008   Influenza, High Dose Seasonal PF 08/09/2015, 09/30/2016, 07/30/2017, 08/07/2018   Influenza,inj,Quad PF,6+ Mos 07/27/2013, 08/09/2014   PFIZER Comirnaty(Gray Top)Covid-19 Tri-Sucrose Vaccine 02/16/2021   PFIZER(Purple Top)SARS-COV-2 Vaccination 01/28/2020, 02/21/2020, 09/04/2020   Pneumococcal Conjugate-13 08/09/2014   Pneumococcal Polysaccharide-23 05/14/2010   Td 07/25/2008    Conditions to be addressed/monitored: Atrial Fibrillation, HTN, HLD, COPD and BPH; type 2 DM; GERD; seizure, aortic aneurysm.  Care Plan : General Pharmacy (Adult)  Updates made by Cherre Robins, PHARMD since 07/26/2021 12:00 AM     Problem: Management of chronic medicaiton conditions and medication: Atrial Fibrillation,  HTN, HLD, type 2 DM, Pulmonary Disease and BPH; seizures; h/o lung cancer and meningioma; GERD; aortic aneurysm   Priority: High  Onset Date: 02/08/2021  Note:   Current Barriers:  Does not adhere to prescribed medication regimen Management of chronic medical conditions and medication management Unable to afford medication therapy  Pharmacist Clinical Goal(s):  Over the next 180 days, patient will achieve adherence to monitoring guidelines and medication adherence to achieve therapeutic efficacy  contact provider office for questions/concerns as evidenced notation of same in electronic health record through collaboration with PharmD and provider.   Interventions: 1:1 collaboration with [name], NP regarding development and update of comprehensive plan of care as evidenced by provider attestation and co-signature Inter-disciplinary care team collaboration (see longitudinal plan of care) Comprehensive medication review performed; medication list updated in electronic medical record  Diabetes: Controlled; A1c goal < 7.0% Last A1c was 5.8 % (07/13/2021) - has not taken metformin in over 3 months.  Current treatment:  none Current glucose readings: does not check BG at home Current meal patterns: eating a little better since CABG 05/2021. He has been out of work so eating out less. 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  Interventions:  Educated on limiting high Bates foods; recommended increase non starchy vegetables Counseled on A1c goal.   Hypertension (BP goal <130/80) / CHF with preserved EF Controlled and actually has low BP/HR during recent hospitalization when he was tried on metoprolol Crrent treatment:  Furosemide 35m daily (lowered from 420m Potassium chloride 10 mEq daily (lowered from 20 mEq)  Previously took diltiazem but stopped during hospitalization when amiodarone was started; tried metoprolol - bradycardia Patient checks BP at home  infrequently - no readings provided EF was 60 to 65% 04/2021 Denies hypotensive/hypertensive symptoms Interventions:  Educated on BP goals  Hyperlipidemia / CAD (LDL < 70) Controlled Recent CABG x 3 vessels 05/08/2021. Patient continues to have issues with plural effusion s/p CABG.  Followed by cardio and cardiothoracic surgery team.  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 Recommended try to increase intake of non starchy vegetables Recommended continue current therapy  Chronic Obstructive Pulmonary Disease: Controlled Taking furosemide + KCL for plural effusion; monitored by cardiothoracic surgery Current treatment:  albuterol as rescue inhaler Advair HFA 115 mcg 2 puffs twice a day Spiriva 2.5mg r74mimat - inhaler two puffs into lungs daily GOLD Classification: 2 (FEV1 50-79%) Most recent Pulmonary Function Testing: 05/19/2017 - spirometry FVC 2.84 (73%) FEV1 1.74 (62%) FEV1/FVC 61% Current COPD Classification:  A (low sx, <2 exacerbations/yr) Mailed patient [place]AP [place] for [place] (for Symbicort) after last visit in June. Wife endorses that they received but has not completed yet.  [place] patient [place] program but Advair dose (must spend $600 out of pocket on Rx medications to qualify) Interventions:  Encouraged patient to complete [place] application or if he prefer the [employer] / [employer] application  Continue to take furosemide and potassium as directed for plural effusion Continue current regimen for COPD  Atrial Fibrillation: NSR since last hospitalization but did  recently change from diltiazem to amiodarone due to being in afib prior to cardiac cath Current rate/rhythm controller: amiodarone 200mg d32m Anticoagulant treatment: Xarelto 20mg da22mwith evening meal Mailed patient PAP application after CCM visit in June - Wife endorses that they received but have not completed yet.  CHADS2VASc score: 5  Interventions:  Recommended continue current regimen Patient to have LFTs and TSH monitored by cardiologist regularly due to amiodarone therpay Encouraged patient to complete application for Xarelto patient assistance and  return to office.   GERD:  Patient is currently controlled on the following medications:  Omeprazole 35m daily Patient has failed these meds in past: None noted  Triggers: laying down after eating Interventions: (addressed at previous visit)  Recommend continue current medications    Patient Goals/Self-Care Activities Over the next 180 days, patient will:  take medications as prescribed and collaborate with provider on medication access solutions Follow up with cardiologist and cardiothoracic team Follow up with neurology Start cardiac rehab program Complete an return patient assistance applications to office.   Follow Up Plan: Telephone follow up appointment with care management team member scheduled for:  1 month                Medication Assistance:  Patient endorses that he has received applications for patient assistance for Advair and Xarelto but has not completed yet.   [facility] [place]Phone: [phone]: 3[place] [place]Phone: [phone]: [phone]  Follow Up:  Patient agrees to Care Plan and Follow-up.  Plan: Telephone follow up appointment with care management team member scheduled for:  1  month  TCherre Robins PharmD Clinical Pharmacist LAult[facility] HospitalPrimary Care SW [facility]

## 2021-07-26 NOTE — Telephone Encounter (Signed)
Patient needs update prescription for albuterol inhaler sent to [facility].

## 2021-07-26 NOTE — Patient Instructions (Signed)
Visit Information  PATIENT GOALS:  Goals Addressed             This Visit's Progress    Chronic Care Management Pharmacy Care Plan       CARE PLAN ENTRY (see longitudinal plan of care for additional care plan information)  Current Barriers:  Chronic Disease Management support, education, and care coordination needs related to Hypertension, Hyperlipidemia, Diabetes, COPD, AFib, GERD, BPH, Seizures   Hypertension BP Readings from Last 3 Encounters:  07/20/21 120/66  07/13/21 122/64  07/11/21 97/66  Pharmacist Clinical Goal(s): Over the next 90 days, patient will work with PharmD and providers to maintain BP goal <140/90 Current regimen:  none Interventions: Discussed blood pressure goal Patient self care activities - Over the next 90 days, patient will: Checked blood pressure at home 2 to 3 times per week and record for future visits  Hyperlipidemia / Heart disease - recent bypass surgery Lab Results  Component Value Date/Time   LDLCALC 58 07/13/2021 01:36 PM   [place] 62 04/11/2021 02:21 PM   LDLDIRECT 142.2 12/26/2008 10:32 AM  Pharmacist Clinical Goal(s): Over the next 90 days, patient will work with PharmD and providers to maintain LDL goal < 100 Current regimen:  Atorvastatin 80mg  daily Aspirin 81mg  daily (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 Recommended try to increase intake of non starchy vegetables Patient self care activities - Over the next 90 days, patient will: Maintain cholesterol medication regimen.  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 Increase intake of non starchy vegetables Recommended continue current therapy  Diabetes Lab Results  Component Value Date/Time   HGBA1C 5.8 07/13/2021 01:36 PM   HGBA1C 6.5 04/11/2021 01:41 PM  Pharmacist Clinical  Goal(s): Over the next 90 days, patient will work with PharmD and providers to maintain A1c goal <7% Current regimen:  Diet and exercise recommeded Interventions: Discussed A1c goal Patient self care activities - Over the next 90 days, patient will: Maintain A1c <7% Limit intake of sugar, potatoes, pasta, rice and bread.  Consider restarting metformin 500mg  twice a day if next A1c is 6.5% or higher  COPD Pharmacist Clinical Goal(s) Over the next 90 days, patient will work with PharmD and providers to reduce symptoms associated with COPD and reduce barriers to access of preferred medication regimens Current regimen:  Albuterol HFA 2 puffs every 6 hours as needed for wheezing Advair HFA 115 mcg Inhale 2 puffs twice a day Spiriva 2.5mg  respimat - inhaler two puffs into lungs daily Interventions: Discussed patient's preference for Advair over [facility]  Reminded patient to return patient assistance applications for [place] [employer] [place] and [employer]  Patient self care activities - Over the next 90 days, patient will: Complete applications for patient assistance and return to office Maintain medication regimen for COPD  Atrial fibrillation Pharmacist Clinical Goal(s) Over the next 90 days, patient will work with PharmD and providers to reduce risk of stroke associated with Afib and reduce barriers to preferred medication regimen Current regimen:  Amiodarone 200mg  daily  Xarelto 20mg  daily Interventions: Reminded patient to completed and return patient assistance application for Xarelto Confirmed patient is in Medicare coverage gap Patient self care activities - Over the next 90 days, patient will: Complete patient assistance for Xarelto Maintain current medication regimen for atrial fibrillation Continue to have liver and thyroid  check every 6 months while on amiodarone therapy   Medication management Pharmacist Clinical Goal(s): Over the next 90 days, patient will work with PharmD and providers to  maintain optimal medication adherence Current pharmacy: [facility] Interventions Comprehensive medication review performed. Continue current medication management strategy Patient self care activities - Over the next 90 days, patient will: Focus on medication adherence by filling and taking medications appropriately  Take medications as prescribed Report any questions or concerns to PharmD and/or provider(s)  Patient Goals/Self-Care Activities Over the next 180 days, patient will:  Take medications as prescribed and collaborate with provider on medication access solutions Follow up with cardiologist and cardiothoracic teams Continue cardiac rehab program Complete and return patient assistance applications to clinical pharmacist  Please see past updates related to this goal by clicking on the "Past Updates" button in the selected goal          Patient verbalizes understanding of instructions provided today and agrees to view in [place].   Telephone follow up appointment with care management team member scheduled for: 1 month  Cherre Robins, PharmD Clinical Pharmacist [facility]

## 2021-07-27 ENCOUNTER — Other Ambulatory Visit: Payer: Self-pay

## 2021-07-27 ENCOUNTER — Other Ambulatory Visit (HOSPITAL_BASED_OUTPATIENT_CLINIC_OR_DEPARTMENT_OTHER): Payer: Self-pay

## 2021-07-27 MED ORDER — ALBUTEROL SULFATE HFA 108 (90 BASE) MCG/ACT IN AERS
2.0000 | INHALATION_SPRAY | Freq: Four times a day (QID) | RESPIRATORY_TRACT | 5 refills | Status: DC | PRN
  Filled 2021-07-27: qty 8.5, 25d supply, fill #0
  Filled 2022-05-13: qty 8.5, 25d supply, fill #1

## 2021-07-27 NOTE — Telephone Encounter (Signed)
Rx was sent, ok per provider

## 2021-07-30 DIAGNOSIS — Z48812 Encounter for surgical aftercare following surgery on the circulatory system: Secondary | ICD-10-CM | POA: Diagnosis not present

## 2021-07-30 DIAGNOSIS — Z951 Presence of aortocoronary bypass graft: Secondary | ICD-10-CM | POA: Diagnosis not present

## 2021-08-01 ENCOUNTER — Other Ambulatory Visit: Payer: Self-pay

## 2021-08-01 ENCOUNTER — Telehealth: Payer: Self-pay | Admitting: Cardiovascular Disease

## 2021-08-01 ENCOUNTER — Telehealth: Payer: Self-pay

## 2021-08-01 ENCOUNTER — Telehealth: Payer: Self-pay | Admitting: Family

## 2021-08-01 DIAGNOSIS — I2510 Atherosclerotic heart disease of native coronary artery without angina pectoris: Secondary | ICD-10-CM

## 2021-08-01 DIAGNOSIS — I25110 Atherosclerotic heart disease of native coronary artery with unstable angina pectoris: Secondary | ICD-10-CM

## 2021-08-01 DIAGNOSIS — I4891 Unspecified atrial fibrillation: Secondary | ICD-10-CM

## 2021-08-01 DIAGNOSIS — E119 Type 2 diabetes mellitus without complications: Secondary | ICD-10-CM

## 2021-08-01 NOTE — Telephone Encounter (Signed)
Pt is requesting his lab order be sent to [name] PCP for friday

## 2021-08-01 NOTE — Telephone Encounter (Signed)
Message sent to pcp for approval of Bmet and TSH to be done here under her orders. Will contact patient for appointment if she approves.

## 2021-08-01 NOTE — Telephone Encounter (Signed)
Order entered for labs, needs appointment to come in Friday. Called but no answer.

## 2021-08-01 NOTE — Telephone Encounter (Signed)
Patient is returning call.  °

## 2021-08-01 NOTE — Telephone Encounter (Signed)
Patient would like for cardiology labs ordered by Kathlen Mody to be done here a [facility]. Please advise.

## 2021-08-01 NOTE — Telephone Encounter (Signed)
Patient is scheduled to have [facility] at [facility] per Cardiology.  Patient will like to come here instead for labs.  Her needs a Bmet and TSH (according to [facility] appointment)  Please advise if ok for patient to come here, labs will need to be order under pcp.

## 2021-08-01 NOTE — Telephone Encounter (Signed)
Left message to call office

## 2021-08-01 NOTE — Telephone Encounter (Signed)
I spoke with patient. He would like to have lab work (BMET and TSH) done at Dr [employer] office as it is closer.  I told patient I would check with Dr Rowe Pavy office to see if OK and how to order.  I told patient if unable to be done at Dr Rowe Pavy office we would call him back.

## 2021-08-02 ENCOUNTER — Telehealth: Payer: Self-pay | Admitting: Family

## 2021-08-02 ENCOUNTER — Other Ambulatory Visit (HOSPITAL_BASED_OUTPATIENT_CLINIC_OR_DEPARTMENT_OTHER): Payer: Self-pay

## 2021-08-02 DIAGNOSIS — Z48812 Encounter for surgical aftercare following surgery on the circulatory system: Secondary | ICD-10-CM | POA: Diagnosis not present

## 2021-08-02 DIAGNOSIS — Z951 Presence of aortocoronary bypass graft: Secondary | ICD-10-CM | POA: Diagnosis not present

## 2021-08-02 NOTE — Addendum Note (Signed)
Addended by: Jiles Prows on: 08/02/2021 12:08 PM   Modules accepted: Orders

## 2021-08-02 NOTE — Telephone Encounter (Signed)
Patient called back and was informed of lab appointment for tomorrow

## 2021-08-02 NOTE — Telephone Encounter (Signed)
Please schedule pt a bmet  dx DM2 and tsh dx Atrial fibrillation.

## 2021-08-02 NOTE — Telephone Encounter (Signed)
Patient was scheduled and notified by [facility] office for labs per his request.

## 2021-08-02 NOTE — Telephone Encounter (Signed)
Labs ordered.

## 2021-08-02 NOTE — Telephone Encounter (Signed)
Called patient a few times to let him know he has been schedule for labs here tomorrow at 8:30.

## 2021-08-03 ENCOUNTER — Ambulatory Visit: Payer: Medicare Other | Attending: Internal Medicine

## 2021-08-03 ENCOUNTER — Other Ambulatory Visit: Payer: Medicare Other

## 2021-08-03 ENCOUNTER — Other Ambulatory Visit: Payer: Self-pay | Admitting: Family

## 2021-08-03 ENCOUNTER — Other Ambulatory Visit (INDEPENDENT_AMBULATORY_CARE_PROVIDER_SITE_OTHER): Payer: Medicare Other

## 2021-08-03 ENCOUNTER — Other Ambulatory Visit: Payer: Self-pay

## 2021-08-03 ENCOUNTER — Other Ambulatory Visit (HOSPITAL_BASED_OUTPATIENT_CLINIC_OR_DEPARTMENT_OTHER): Payer: Self-pay

## 2021-08-03 ENCOUNTER — Ambulatory Visit: Payer: Medicare Other | Admitting: Pharmacist

## 2021-08-03 DIAGNOSIS — I25110 Atherosclerotic heart disease of native coronary artery with unstable angina pectoris: Secondary | ICD-10-CM

## 2021-08-03 DIAGNOSIS — I4891 Unspecified atrial fibrillation: Secondary | ICD-10-CM

## 2021-08-03 DIAGNOSIS — I2510 Atherosclerotic heart disease of native coronary artery without angina pectoris: Secondary | ICD-10-CM

## 2021-08-03 DIAGNOSIS — J449 Chronic obstructive pulmonary disease, unspecified: Secondary | ICD-10-CM

## 2021-08-03 DIAGNOSIS — I480 Paroxysmal atrial fibrillation: Secondary | ICD-10-CM

## 2021-08-03 DIAGNOSIS — Z23 Encounter for immunization: Secondary | ICD-10-CM

## 2021-08-03 DIAGNOSIS — E119 Type 2 diabetes mellitus without complications: Secondary | ICD-10-CM

## 2021-08-03 DIAGNOSIS — E785 Hyperlipidemia, unspecified: Secondary | ICD-10-CM

## 2021-08-03 LAB — BASIC METABOLIC PANEL
BUN: 19 mg/dL (ref 6–23)
CO2: 30 mEq/L (ref 19–32)
Calcium: 9.2 mg/dL (ref 8.4–10.5)
Chloride: 104 mEq/L (ref 96–112)
Creatinine, Ser: 0.96 mg/dL (ref 0.40–1.50)
GFR: 74.86 mL/min (ref >60.00)
Glucose, Bld: 124 mg/dL — ABNORMAL HIGH (ref 70–99)
Potassium: 4.6 mEq/L (ref 3.5–5.1)
Sodium: 141 mEq/L (ref 135–145)

## 2021-08-03 LAB — TSH: TSH: 4.26 u[IU]/mL (ref 0.35–5.50)

## 2021-08-03 MED ORDER — ADVAIR HFA 115-21 MCG/ACT IN AERO
2.0000 | INHALATION_SPRAY | Freq: Two times a day (BID) | RESPIRATORY_TRACT | 5 refills | Status: DC
  Filled 2021-08-03: qty 12, 30d supply, fill #0

## 2021-08-03 MED FILL — Rivaroxaban Tab 20 MG: ORAL | 30 days supply | Qty: 30 | Fill #3 | Status: AC

## 2021-08-03 NOTE — Progress Notes (Signed)
   Covid-19 Vaccination Clinic  Name:  Ross Bender    MRN: [id number] DOB: 10-07-1941  08/03/2021  Mr. Fricker was observed post Covid-19 immunization for 15 minutes without incident. He was provided with Vaccine Information Sheet and instruction to access the V-Safe system.   Mr. Magnussen was instructed to call 911 with any severe reactions post vaccine: Difficulty breathing  Swelling of face and throat  A fast heartbeat  A bad rash all over body  Dizziness and weakness

## 2021-08-05 NOTE — Patient Instructions (Signed)
Visit Information  PATIENT GOALS:  Goals Addressed             This Visit's Progress    Chronic Care Management Pharmacy Care Plan   On track    [place] (see longitudinal plan of care for additional care plan information)  Current Barriers:  Chronic Disease Management support, education, and care coordination needs related to Hypertension, Hyperlipidemia, Diabetes, COPD, AFib, GERD, BPH, Seizures   Hypertension BP Readings from Last 3 Encounters:  07/20/21 120/66  07/13/21 122/64  07/11/21 97/66  Pharmacist Clinical Goal(s): Over the next 90 days, patient will work with PharmD and providers to maintain BP goal <140/90 Current regimen:  none Interventions: Discussed blood pressure goal Patient self care activities - Over the next 90 days, patient will: Checked blood pressure at home 2 to 3 times per week and record for future visits  Hyperlipidemia / Heart disease - recent bypass surgery Lab Results  Component Value Date/Time   LDLCALC 58 07/13/2021 01:36 PM   [place] 62 04/11/2021 02:21 PM   LDLDIRECT 142.2 12/26/2008 10:32 AM  Pharmacist Clinical Goal(s): Over the next 90 days, patient will work with PharmD and providers to maintain LDL goal < 100 Current regimen:  Atorvastatin 80mg  daily Aspirin 81mg  daily (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 Recommended try to increase intake of non starchy vegetables Patient self care activities - Over the next 90 days, patient will: Maintain cholesterol medication regimen.  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 Increase intake of non starchy vegetables Recommended continue current therapy  Diabetes Lab Results  Component Value Date/Time   HGBA1C 5.8 07/13/2021 01:36 PM   HGBA1C 6.5 04/11/2021 01:41 PM  Pharmacist  Clinical Goal(s): Over the next 90 days, patient will work with PharmD and providers to maintain A1c goal <7% Current regimen:  Diet and exercise recommeded Interventions: Discussed A1c goal Patient self care activities - Over the next 90 days, patient will: Maintain A1c <7% Limit intake of sugar, potatoes, pasta, rice and bread.  Consider restarting metformin 500mg  twice a day if next A1c is 6.5% or higher  COPD Pharmacist Clinical Goal(s) Over the next 90 days, patient will work with PharmD and providers to reduce symptoms associated with COPD and reduce barriers to access of preferred medication regimens Current regimen:  Albuterol HFA 2 puffs every 6 hours as needed for wheezing Advair HFA 115 mcg Inhale 2 puffs twice a day Spiriva 2.5mg  respimat - inhaler two puffs into lungs daily Interventions: Discussed patient's preference for Advair over [place] patient I completing applications for [place] [employer] [place] and [employer] and getting 2022 out of pocket expenses for medications Patient self care activities - Over the next 90 days, patient will: Maintain medication regimen for COPD  Atrial fibrillation Pharmacist Clinical Goal(s) Over the next 90 days, patient will work with PharmD and providers to reduce risk of stroke associated with Afib and reduce barriers to preferred medication regimen Current regimen:  Amiodarone 200mg  daily  Xarelto 20mg  daily Interventions: Assisted patient to completed patient assistance application for Xarelto Confirmed patient is in Medicare coverage gap Patient self care activities - Over the next 90 days, patient will: Maintain current medication regimen for atrial fibrillation Continue to have liver and thyroid check every 6 months while on amiodarone therapy  Medication management Pharmacist Clinical Goal(s): Over the next 90 days, patient will work with PharmD and providers to maintain optimal medication adherence Current pharmacy: [facility] Interventions Comprehensive medication review performed. Continue current medication management strategy Patient self care activities - Over the next 90 days, patient will: Focus on medication adherence by filling and taking medications appropriately  Take medications as prescribed Report any questions or concerns to PharmD and/or provider(s)  Health Maintenance:  Reviewed vaccination history and discussed benefits of abivalent COVID booster Patient to get the following vaccines today at [place] after our visit: bivalent COVID booster  Patient Goals/Self-Care Activities Over the next 180 days, patient will:  Take medications as prescribed and collaborate with provider on medication access solutions Follow up with cardiologist and cardiothoracic teams Continue cardiac rehab program Complete and return patient assistance applications to clinical pharmacist  Please see past updates related to this goal by clicking on the "Past Updates" button in the selected goal          The patient verbalized understanding of instructions, educational materials, and care plan provided today and declined offer to receive copy of patient instructions, educational materials, and care plan.   Telephone follow up appointment with care management team member scheduled for: 1 month  Cherre Robins, PharmD Clinical Pharmacist [facility]

## 2021-08-05 NOTE — Chronic Care Management (AMB) (Signed)
Chronic Care Management Pharmacy Note  08/05/2021 Name:  Robert Bates MRN:  [id number] DOB:  10/26/1941  Summary;  Patient in medicare coverage gap and cost of xarelto and Advair is high. Assisted patient in getting OOP expense in 2022 from pharmacy and completing applications for medication assistance.  Discussed vaccines - patient to get bivalent COVID vaccine today from [place]: Eddison Searls is an 80 y.o. year old male who is a primary patient of [name], NP.  The CCM team was consulted for assistance with disease management and care coordination needs.    Engaged with patient by telephone for follow up visit in response to provider referral for pharmacy case management and/or care coordination services.   Consent to Services:  The patient was given information about Chronic Care Management services, agreed to services, and gave verbal consent prior to initiation of services.  Please see initial visit note for detailed documentation.   Patient Care Team: [name], NP as PCP - General Nahser, Wonda Cheng, MD as PCP - Cardiology (Cardiology) Elsie Stain, MD as Attending Physician (Pulmonary Disease) Virgina Evener, OD (Optometry) Cherre Robins, PharmD (Pharmacist) Sharmon Revere as Physician Assistant (Cardiology)  Recent office visits: 07/13/2021 - PCP Inda Castle, NP) F/U chronic conditions. Removed furosemide from med list (patient preferance) and tramadols (completed course) 04/11/2021 - PCP Inda Castle, NP) F/U DM and HTN. Restarted Advair HFA 115/21 2 puffs twice a day; Started sildenafil 20mg  as needed for ED.  03/07/2021 - PCP Inda Castle) Acute visit for HA; HA started after hitting head on a beam on 03/04/2021; patient also reported chest pain on 5/3. CT head ordered due to taking Xarelto; CT showed no acute abnormalities and no change in 2 meningiomas previously present. 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  Continued Xarelto for Afib; Plan to stop aspirin 81mg  3 months from CABG (October 2022 - but pt to check with surgeon). Added furosemide 20mg  daily and potassium 10 mEq daily. Recheck BMP in 2 weeks.  07/11/2021 - Cardiothoracic surgery Harriet Pho, [facility]) F/u CABG with complecation of plural effusion and left thoracenteisi 05/18/2021 and 06/07/2021. Notes suggest restarting furosemide for LEE but not added back to med list.  06/28/2021 - Cardiothoracic Surgery Harriet Pho, [facility]) F/U CABG with complication of plural effusion. Xray orderd. Patient sent for thoracentesis. Recommended continue furosemide 40mg  daily and potassium 20 mEq daily. 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 Sondra Come. Currently on observation. Repeat CT scan of the chest performed recently showed no concerning findings for disease recurrence or metastasis.  The pleural effusion is secondary to his recent cardiac surgery. Recommended cont observation with repeat chest CT in 1 year 06/11/2021 - Cardio ([place], [facility]) Left plural effusion re-accumulation per xray. Recommended continue furosemide 40mg  daily and potassium 20 mEq daily. Recheck in 2 weeks.  06/05/2021 - Cardio [place], [facility]) Hospital F/U S/P CABG. Continue Aspirin until 3 months post CABG, Stopped metoprolol due to lightheadedness and low HR. Started furosemide 40mg  daily for 7 days and as needed thereafter for weight gain > 3 lbs and potassium 20 mEq daily for 7 days and as needed when taking furosemide.  BMET checked and will be repeated  in 1 week.  06/04/2021 - Cardiothoracic surgery Harriet Pho, [facility]) F/U CABG and left plural effusion. Need thoracentesis on  05/18/2021 while in hospital. Unable to give diuretic due to low BP. Ordered US guided thoracentesis with repeat xray in 1 week.  05/28/2021 - Cardio ([place], [facility]) Afib Clinic. Noted to be bradycardic. Recommended lower dose of metoprolol to 12.5mg  bid. No other med changes.  05/24/2021 - Cardiothoracic surgery (Dr Orvan Seen) F/U CABG. No med changes.  04/18/2021 - Cardio Glenford Peers, NP) Video Visit to discuss abnormal CCTA from 04/16/2021. Showed flow limiting lesions in the mLAD, mLCX and mid diag vessel. Ordered cardiac cath. since allergy to contact dye - premedication planned wiht prednisone for Q13, Q7 and A1h before cath and benadryl 25mg  1 our before cath. Instructed to hold Xarelto prior to cath. Removed levetiracetam from med list - patinet not taking.  03/12/2021 - Cardio Glenford Peers, NP) Atypical CP. Ordered CCTA - added beta blocker for scan and also will premedicate with prednisone Q13, 7 and 1 hour before scan 2/2 h/o contract dye allergy; NTG added.   Hospital visits: 05/08/2021 to 07/18/[facility] Admission at [facility] - Admitted for CABG x 3 performed on 05/08/2021. Post Op blood loss anemia with transfusion on 07/6. Medications Started: cephalexin 500mg  every 8 hours  Dulera 200/82mcg - 2 puffs twice a day  Metoprolol tartrate 25mg  twice a day  Potassium chloride 10 mEq daily for 4 days  Torsemide 20mg  take 1 tablet daily for 4 days Medications Stopped: Advair HFA 115/21 mcg and NTG SL Medication Changes: amiodarone 200mg  bid for 5 days, then once a day thereafter  04/20/2021 to 04/26/2021 [facility] Admission Cath @ [place] on 04/20/2021. Showe significant disease. He was to have bypass surgery but was found to be COVID positive. He was treated with Paxlovid. He was kept in hospital for 5 days toremain on heparin drip since he has Afib. Will return to hospital for CABG 05/08/2021. New Medication: Atorvastatin 80mg ; amiodarone 200mg  bid for 7 days, then 200mg  daily thereafter;  metfromin 500mg  bid Medication stopped: pravastatin 80mg ; diltiazem Medication changes: none  Objective:  Lab Results  Component Value Date   CREATININE 0.96 08/03/2021   CREATININE 0.86 06/14/2021   CREATININE 0.86 06/05/2021    Lab Results  Component Value Date   HGBA1C 5.8 07/13/2021   Last diabetic Eye exam: No results found for: HMDIABEYEEXA  Last diabetic Foot exam: No results found for: HMDIABFOOTEX      Component Value Date/Time   CHOL 140 07/13/2021 1336   TRIG 72.0 07/13/2021 1336   HDL 67.00 07/13/2021 1336   CHOLHDL 2 07/13/2021 1336   VLDL 14.4 07/13/2021 1336   LDLCALC 58 07/13/2021 1336   LDLCALC 62 04/11/2021 1421   LDLDIRECT 142.2 12/26/2008 1032    Hepatic Function Latest Ref Rng & Units 06/14/2021 05/04/2021 12/08/2020  Total Protein 6.5 - 8.1 g/dL 5.9(L) 5.9(L) 6.1  Albumin 3.5 - 5.0 g/dL 2.8(L) 3.3(L) -  AST 15 - 41 U/L 11(L) 16 13  ALT 0 - 44 U/L 13 13 12   Alk Phosphatase 38 - 126 U/L 103 80 -  Total Bilirubin 0.3 - 1.2 mg/dL 0.4 0.5 0.4  Bilirubin, Direct 0.0 - 0.3 mg/dL - - -    Lab Results  Component Value Date/Time   TSH 4.26 08/03/2021 08:25 AM   TSH 1.31 11/08/2019 02:55 PM    CBC Latest Ref Rng & Units 06/14/2021 05/17/2021 05/15/2021  WBC 4.0 - 10.5 K/uL 6.8 9.3 9.4  Hemoglobin 13.0 - 17.0 g/dL 10.4(L) 9.5(L) 9.5(L)  Hematocrit 39.0 - 52.0 % 32.4(L) 29.9(L) 29.7(L)  Platelets 150 - 400 K/uL 247 302 268    No results found for: VD25OH  Clinical ASCVD: Yes  The ASCVD Risk score (Arnett DK, et al., 2019) failed to calculate for the following reasons:   The 2019 ASCVD risk score is only valid for ages 77 to 87    Other: CHADS2VASc = 5   Social History   Tobacco Use  Smoking Status Former   Packs/day: 1.00   Years: 57.00   Pack years: 57.00   Types: Cigarettes, Cigars   Quit date: 08/08/2015   Years since quitting: 5.9  Smokeless Tobacco Former   Types: Chew   Quit date: 11/04/1958  Tobacco Comments   occasional use sneaks  around   BP Readings from Last 3 Encounters:  07/20/21 120/66  07/13/21 122/64  07/11/21 97/66   Pulse Readings from Last 3 Encounters:  07/20/21 67  07/13/21 66  07/11/21 73   Wt Readings from Last 3 Encounters:  07/20/21 165 lb 12.8 oz (75.2 kg)  07/13/21 164 lb (74.4 kg)  07/11/21 165 lb (74.8 kg)    Assessment: Review of patient past medical history, allergies, medications, health status, including review of consultants reports, laboratory and other test data, was performed as part of comprehensive evaluation and provision of chronic care management services.   SDOH:  (Social Determinants of Health) assessments and interventions performed:  SDOH Interventions    Flowsheet Row Most Recent Value  SDOH Interventions   Physical Activity Interventions Cardiac Rehab        CCM Care Plan  Allergies  Allergen Reactions   Iodine Other (See Comments)    neck swells   Iohexol Swelling    Neck and gland swelling per patient.    Medications Reviewed Today     Reviewed by Cherre Robins, PharmD (Pharmacist) on 07/26/21 at 1054  Med List Status: <None>   Medication Order Taking? Sig Documenting Provider Last Dose Status Informant  acetaminophen (TYLENOL) 500 MG tablet [id number] Yes Take 1-2 tablets (500-1,000 mg total) by mouth every 6 (six) hours as needed. Lars Pinks M, PA-C Taking Active   albuterol (VENTOLIN HFA) 108 (90 Base) MCG/ACT inhaler [id number] Yes Inhale 2 puffs into the lungs every 6 (six) hours as needed for wheezing or shortness of breath. [name], NP Taking Active Self  amiodarone (PACERONE) 200 MG tablet [id number] Yes Take 1 tablet (200 mg total) by mouth 2 (two) times daily for 5 days, then take tablet (200 mg) once daily thereafter  Patient taking differently: Take 200 mg by mouth daily.   Nani Skillern, PA-C Taking Active   aspirin EC 81 MG tablet [id number] Yes Take 1 tablet (81 mg total) by mouth in the morning. Swallow whole.  Nani Skillern, PA-C Taking Active   atorvastatin (LIPITOR) 80 MG tablet [id number] Yes Take 1 tablet (80 mg total) by mouth daily. Margie Billet, NP Taking Active   fluticasone-salmeterol (ADVAIR HFA) 425-95 MCG/ACT inhaler [id number] Yes Inhale 2 puffs into the lungs 2 (two) times daily. [provider] Taking Active   furosemide (LASIX) 20 MG tablet [id number] Yes Take 1 tablet (20 mg total) by mouth daily. Richardson Dopp T, [place] Taking Active     Discontinued 05/08/21 1058 mometasone-formoterol (DULERA) 200-5 MCG/ACT AERO [id number] No Inhale 2 puffs into the lungs 2 (two) times daily.  Patient not taking: Reported on 07/26/2021   [provider]  Not Taking Consider Medication Status and Discontinue   Multiple Vitamin (MULTIVITAMIN WITH MINERALS) TABS tablet [id number] Yes Take 1 tablet by mouth daily in the afternoon. [provider] Taking Active   omeprazole (PRILOSEC) 40 MG capsule [id number] Yes Take 1 capsule (40 mg total) by mouth daily. [name], NP Taking Active Self  potassium chloride (KLOR-CON) 10 MEQ tablet [id number] Yes Take 1 tablet (10 mEq total) by mouth daily. Richardson Dopp T, PA-C Taking Active   rivaroxaban (XARELTO) 20 MG TABS tablet [id number] Yes TAKE 1 TABLET BY MOUTH DAILY WITH SUPPER Nahser, Wonda Cheng, MD Taking Active   tamsulosin (FLOMAX) 0.4 MG CAPS capsule [id number] Yes TAKE 1 CAPSULE (0.4 MG TOTAL) BY MOUTH DAILY. [name], NP Taking Active   Tiotropium Bromide Monohydrate (SPIRIVA RESPIMAT) 2.5 MCG/ACT AERS [id number] Yes Inhale 2 puffs into the lungs daily. [provider] Taking Active             Patient Active Problem List   Diagnosis Date Noted   Persistent atrial fibrillation ([place]) 05/28/2021   Secondary hypercoagulable state ([place]) 05/28/2021   Malnutrition of moderate degree 05/15/2021   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   Coronary artery disease 05/08/2021   COVID-19 virus infection 04/23/2021   Unstable  angina (HCC) 04/20/2021   Erectile dysfunction 04/11/2021   Head trauma 03/07/2021   Left leg pain 04/07/2020   Benign prostatic hyperplasia with nocturia 03/08/2020   Seizures ([place]) 02/18/2020   PAF (paroxysmal atrial fibrillation) ([place]) 08/15/2017   Diabetes type 2, controlled ([place]) 07/31/2017   COPD GOLD II  04/03/2017   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[place]) 09/06/2015   Lung cancer ([place]) 11/07/2014   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   HTN (hypertension) 04/29/2014   Meningioma ([place]) 10/25/2013   Low back pain 10/25/2013   Osteoarthritis 08/18/2012   Atypical chest pain 08/14/2011   KERATOSIS 10/09/2010   SCIATICA, RIGHT 10/09/2010   UNSPECIFIED HEARING LOSS 05/21/2010   Hyperlipidemia 05/14/2010   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   RENAL CYST, RIGHT 02/05/2010   Abdominal aortic aneurysm 01/29/2010   LIPOMA 11/17/2009   MICROSCOPIC HEMATURIA 08/11/2008   GERD 07/26/2008   DERMOID CYST 07/25/2008    Immunization History  Administered Date(s) Administered   Fluad Quad(high Dose 65+) 07/23/2019, 09/04/2020, 07/13/2021   Influenza Split 08/02/2011, 08/18/2012   Influenza Whole 08/17/2008   Influenza, High Dose Seasonal PF 08/09/2015, 09/30/2016, 07/30/2017, 08/07/2018   Influenza,inj,Quad PF,6+ Mos 07/27/2013, 08/09/2014   PFIZER Comirnaty(Gray Top)Covid-19 Tri-Sucrose Vaccine 02/16/2021   PFIZER(Purple Top)SARS-COV-2 Vaccination 01/28/2020, 02/21/2020, 09/04/2020   Pfizer Covid-19 Vaccine Bivalent Booster 3yrs & up 08/03/2021   Pneumococcal Conjugate-13 08/09/2014   Pneumococcal Polysaccharide-23 05/14/2010   Td 07/25/2008    Conditions to be addressed/monitored: Atrial Fibrillation, HTN, HLD, COPD and BPH; type 2 DM; GERD; seizure, aortic aneurysm.  Care Plan : General Pharmacy (Adult)  Updates made by Cherre Robins, PHARMD since 08/05/2021 12:00 AM     Problem: Management of chronic medicaiton conditions and medication: Atrial Fibrillation,  HTN, HLD, type 2 DM, Pulmonary Disease and BPH; seizures; h/o lung cancer and meningioma; GERD; aortic aneurysm   Priority: High  Onset Date: 02/08/2021  Note:   Current Barriers:  Does not adhere to prescribed medication regimen Management of chronic medical conditions and medication management Unable to afford medication therapy  Pharmacist Clinical Goal(s):  Over the next 180 days, patient will achieve adherence to monitoring guidelines and medication adherence to achieve therapeutic efficacy contact provider office for  questions/concerns as evidenced notation of same in electronic health record through collaboration with PharmD and provider.   Interventions: 1:1 collaboration with [name], NP regarding development and update of comprehensive plan of care as evidenced by provider attestation and co-signature Inter-disciplinary care team collaboration (see longitudinal plan of care) Comprehensive medication review performed; medication list updated in electronic medical record  Diabetes: Controlled; A1c goal < 7.0% Last A1c was 5.8 % (07/13/2021) - has not taken metformin in over 3 months.  Current treatment:  none Current glucose readings: does not check BG at home Current meal patterns: eating a little better since CABG 05/2021. He has been out of work so eating out less. 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  Interventions:  Educated on limiting high CHO foods; recommended increase non starchy vegetables Counseled on A1c goal.   Hypertension (BP goal <130/80) / CHF with preserved EF Controlled  Crrent treatment:  Furosemide 20mg  daily (lowered from 40mg ) Potassium chloride 10 mEq daily (lowered from 20 mEq)  Previously took diltiazem but stopped during hospitalization when amiodarone was started; tried metoprolol - bradycardia Patient checks BP at home infrequently - no readings provided EF was 60 to 65% 04/2021 Denies hypotensive/hypertensive  symptoms Interventions: (addressed at previous visit) Educated on BP goals Recommended continue current therpay   Hyperlipidemia / CAD (LDL < 70) Controlled Recent CABG x 3 vessels 05/08/2021. Patient continues to have issues with plural effusion s/p CABG.  Followed by cardio and cardiothoracic surgery team.  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 Recommended try to increase intake of non starchy vegetables Recommended continue current therapy  Chronic Obstructive Pulmonary Disease: Controlled Taking furosemide + KCL for plural effusion; monitored by cardiothoracic surgery Current treatment:  albuterol as rescue inhaler Advair HFA 115 mcg 2 puffs twice a day Spiriva 2.5mg  respimat - inhaler two puffs into lungs daily GOLD Classification: 2 (FEV1 50-79%) Most recent Pulmonary Function Testing: 05/19/2017 - spirometry FVC 2.84 (73%) FEV1 1.74 (62%) FEV1/FVC 61% Current COPD Classification:  A (low sx, <2 exacerbations/yr)  [place] does not have patient assistance program but Advair dose (must spend $600 out of pocket on Rx medications to qualify) Interventions:  Assisted patient in completing [place] / [employer] and [place] and [place] applications  Continue to take furosemide and potassium as directed for plural effusion Continue current regimen for COPD  Atrial Fibrillation: NSR since last hospitalization but did recently change from diltiazem to amiodarone due to being in afib prior to cardiac cath Current rate/rhythm controller: amiodarone 200mg  daily Anticoagulant treatment: Xarelto 20mg  daily with evening  meal Mailed patient PAP application after CCM visit in June - patient brought in application today [id number] score: 5  Interventions:  Recommended continue current regimen Patient to have LFTs and TSH monitored by cardiologist regularly due to amiodarone therapy Financial assessment completed and verified with pharmacy that patient is in coverage gap. Also assisted patient in getting report from his pharmacy with 2022 out of pocket expenses. Assisted in completion of Xarelto patient assistance application   GERD:  Patient is currently controlled on the following medications:  Omeprazole 40mg  daily Patient has failed  these meds in past: None noted  Triggers: laying down after eating Interventions: (addressed at previous visit)  Recommend continue current medications    Health Maintenance:  Reviewed vaccination history and discussed benefits of abivalent COVID booster Patient to get the following vaccines today at [facility] after our visit: bivalent COVID booster  Patient Goals/Self-Care Activities Over the next 180 days, patient will:  take medications as prescribed and collaborate with provider on medication access solutions Follow up with cardiologist and cardiothoracic team Continue cardiac rehab program  Follow Up Plan: Telephone follow up appointment with care management team member scheduled for:  1 month                Medication Assistance: Application for Xarelto and Advair  medication assistance program. in process.  Anticipated assistance start date 08/23/2021.  See plan of care for additional detail.  Patient's preferred pharmacy is:  [facility] [address], [place] [address] Phone: [phone] Fax: [place] [address] [place] [address] Phone: [phone] Fax: [phone]   Follow Up:  Patient agrees to Care Plan and  Follow-up.  Plan: Telephone follow up appointment with care management team member scheduled for:  1 month  Cherre Robins, PharmD Clinical Pharmacist [facility]

## 2021-08-06 DIAGNOSIS — Z951 Presence of aortocoronary bypass graft: Secondary | ICD-10-CM | POA: Diagnosis not present

## 2021-08-06 DIAGNOSIS — Z48812 Encounter for surgical aftercare following surgery on the circulatory system: Secondary | ICD-10-CM | POA: Diagnosis not present

## 2021-08-07 ENCOUNTER — Encounter: Payer: Self-pay | Admitting: Physician Assistant

## 2021-08-07 ENCOUNTER — Other Ambulatory Visit: Payer: Self-pay

## 2021-08-07 ENCOUNTER — Ambulatory Visit (INDEPENDENT_AMBULATORY_CARE_PROVIDER_SITE_OTHER): Payer: Self-pay | Admitting: Physician Assistant

## 2021-08-07 ENCOUNTER — Other Ambulatory Visit (HOSPITAL_BASED_OUTPATIENT_CLINIC_OR_DEPARTMENT_OTHER): Payer: Self-pay

## 2021-08-07 VITALS — BP 114/68 | HR 69 | Resp 20 | Ht 69.0 in | Wt 166.0 lb

## 2021-08-07 DIAGNOSIS — Z951 Presence of aortocoronary bypass graft: Secondary | ICD-10-CM

## 2021-08-07 NOTE — Progress Notes (Signed)
[address]       [place] [address]             [phone]       HPI:  Robert Bates is a 80 y.o. male patient status post coronary artery bypass grafting x4 by Dr. Julien Girt on 05/08/2021 for multivessel coronary artery disease presenting with normal left ventricular ejection fraction and mildly reduced right ventricular systolic function.  While in the hospital he had a left thoracentesis on 05/18/2021 for a left pleural effusion.  He also was treated for his COPD with [facility] and albuterol nebs.  He has a history of paroxysmal atrial fibrillation and had some atrial bigeminy in the hospital and was converted on amiodarone IV and digoxin.  He was started on some midodrine for hypotension.  Dr. Julien Girt saw the patient on 7/21 and on his chest x-ray that showed a small left pleural effusion.   He was seen in the office about 10 days later and was noted to have progression of the left pleural effusion.  He had ultrasound-guided left thoracentesis on 06/07/2021 yielding 1.2 L of clear amber fluid.  His follow-up chest x-ray showed considerable improvement in the expansion of his left lung and no pneumothorax.    Robert Bates reports he is continue to progress overall.  He denies significant shortness of breath and is anxious to return to work.   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  He is gradually advancing his activity.  He returns today for follow-up for his recurrent left pleural effusion. He went for a thoracentesis after our last appointment and they were not able to tap his pleural space because there was not enough fluid.   Today, he returns to discuss return to work timing.   Current Outpatient Medications  Medication Sig Dispense Refill   acetaminophen (TYLENOL) 500 MG tablet Take 1-2 tablets (500-1,000 mg total) by mouth every 6 (six) hours as needed. 30 tablet 0    albuterol (VENTOLIN HFA) 108 (90 Base) MCG/ACT inhaler Inhale 2 puffs into the lungs every 6 (six) hours as needed for wheezing or shortness of breath. 8.5 g 5   amiodarone (PACERONE) 200 MG tablet Take 1 tablet (200 mg total) by mouth 2 (two) times daily for 5 days, then take tablet (200 mg) once daily thereafter 35 tablet 1   aspirin EC 81 MG tablet Take 1 tablet (81 mg total) by mouth in the morning. Swallow whole.     atorvastatin (LIPITOR) 80 MG tablet Take 1 tablet (80 mg total) by mouth daily. 90 tablet 1   furosemide (LASIX) 40 MG tablet Take 1 tablet (40 mg total) by mouth as needed for edema. Take with Potassium tablet. 30 tablet 2   mometasone-formoterol (DULERA) 200-5 MCG/ACT AERO Inhale 2 puffs into the lungs 2 (two) times daily.     Multiple Vitamin (MULTIVITAMIN WITH MINERALS) TABS tablet Take 1 tablet by mouth daily in the afternoon.     omeprazole (PRILOSEC) 40 MG capsule Take 1 capsule (40 mg total) by mouth daily. 30 capsule 5   potassium chloride SA (KLOR-CON) 20 MEQ tablet Take 1 tablet (20 mEq total) by mouth as needed (edema.  To be taken with lasix tablet.). Take with Lasix tablet. 30 tablet 2   rivaroxaban (XARELTO) 20 MG TABS tablet TAKE 1 TABLET BY MOUTH DAILY WITH  SUPPER 30 tablet 5   tamsulosin (FLOMAX) 0.4 MG CAPS capsule TAKE 1 CAPSULE (0.4 MG TOTAL) BY MOUTH DAILY. 30 capsule 3   traMADol (ULTRAM) 50 MG tablet Take 1 tablet (50 mg total) by mouth every 6 (six) hours as needed for moderate pain. 24 tablet 0   No current facility-administered medications for this visit.    Physical Exam  Vitals:   08/07/21 1246  BP: 114/68  Pulse: 69  Resp: 20  SpO2: 97%      General-Robert Bates walked in to the office today and appears stable with his mobility. Heart-regular rate and rhythm, no murmur. Chest-no wheezing, CTA bilaterally and in all fields Extremities-bilateral lower ext edema R > L (vein harvest side), multiple superficial veins especially on his left lower  leg.  Incisions-chest incision with bump mid incision likely a sternal wire.    Diagnostic Tests:  CLINICAL DATA:  Postop CABG.  History of pleural effusion.   EXAM: CHEST - 2 VIEW   COMPARISON:  Radiographs 06/27/2021 and CT 06/14/2021.   FINDINGS: The heart size and mediastinal contours are stable status post median sternotomy, CABG and clipping of the left atrial appendage. There is stable chronic volume loss in the left hemithorax with left perihilar scarring and a dependent left pleural effusion. The right lung is clear. There is no pneumothorax or acute osseous abnormality. Stable probable bone island in the T8 vertebral body.   IMPRESSION: Stable postoperative chest with chronic left perihilar scarring and left pleural effusion. No acute findings.     Electronically Signed   By: Richardean Sale M.D.   On: 07/11/2021 12:23     Electronically Signed   By: Genevie Ann M.D.   On: 06/27/2021 10:31   Impression / Plan:   Robert Bates is about 12 weeks status post coronary bypass grafting after presenting with vessel coronary disease and normal ejection fraction.  He is progressing well overall.  He will need to continue with his xarelto and atorvastatin.   Left pleural effusion, recurrent-- he had left thoracentesis while in the hospital and this was repeated three weeks ago yielding about 1200 mL of amber fluid.  On his last chest x-ray he has a stable left pleural effusion that may be slightly smaller compared to his last CXR.   He reported to Radiology after his last appointment and this was documented: Patient presents for therapeutic and diagnostic right sided thoracentesis. [place] limited shows small amount of pleural fluid. Patient requested the procedure be deferred until such time as he is more symptomatic. Procedure not performed. His SOB has much improved.   Postoperative atrial fibrillation-he remains in sinus rhythm on amiodarone 200 mg p.o. daily. He is followed by the  Afib clinic. I will let them determine stopping Amio when they see appropriate.   Lower extremity edema-he was on lasix but isn't compliant with it because he feels like he has to "pee every hour". His last creatinine was 0.96. I think he will need lasix long-term to control his lower extremity edema and fluid overload.    Plan:  Feels okay today. He wants to return to work part-time. 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 He can return part time on light duty. Still no heavy lifting. He can return to our office PRN.   Nicholes Rough, PA-C [phone]

## 2021-08-08 DIAGNOSIS — Z951 Presence of aortocoronary bypass graft: Secondary | ICD-10-CM | POA: Diagnosis not present

## 2021-08-08 DIAGNOSIS — Z48812 Encounter for surgical aftercare following surgery on the circulatory system: Secondary | ICD-10-CM | POA: Diagnosis not present

## 2021-08-09 DIAGNOSIS — Z48812 Encounter for surgical aftercare following surgery on the circulatory system: Secondary | ICD-10-CM | POA: Diagnosis not present

## 2021-08-09 DIAGNOSIS — Z951 Presence of aortocoronary bypass graft: Secondary | ICD-10-CM | POA: Diagnosis not present

## 2021-08-13 ENCOUNTER — Other Ambulatory Visit: Payer: Self-pay | Admitting: Family

## 2021-08-13 ENCOUNTER — Ambulatory Visit (INDEPENDENT_AMBULATORY_CARE_PROVIDER_SITE_OTHER): Payer: Medicare Other | Admitting: Pharmacist

## 2021-08-13 DIAGNOSIS — I2510 Atherosclerotic heart disease of native coronary artery without angina pectoris: Secondary | ICD-10-CM

## 2021-08-13 DIAGNOSIS — J449 Chronic obstructive pulmonary disease, unspecified: Secondary | ICD-10-CM

## 2021-08-13 DIAGNOSIS — Z48812 Encounter for surgical aftercare following surgery on the circulatory system: Secondary | ICD-10-CM | POA: Diagnosis not present

## 2021-08-13 DIAGNOSIS — Z951 Presence of aortocoronary bypass graft: Secondary | ICD-10-CM | POA: Diagnosis not present

## 2021-08-13 DIAGNOSIS — I4891 Unspecified atrial fibrillation: Secondary | ICD-10-CM

## 2021-08-13 MED ORDER — ADVAIR HFA 115-21 MCG/ACT IN AERO: 2.0000 | INHALATION_SPRAY | Freq: Two times a day (BID) | RESPIRATORY_TRACT | 4 refills | Status: DC

## 2021-08-13 NOTE — Chronic Care Management (AMB) (Signed)
Chronic Care Management Pharmacy Note  08/13/2021 Name:  Robert Bates MRN:  [id number] DOB:  Jun 09, 1941  Summary;  Patient in medicare coverage gap and cost of xarelto and Advair is high. Application have been completed for PAP for Xarelto and Advair. Faxed today.  Subjective: Robert Bates is an 80 y.o. year old male who is a primary patient of [name], NP.  The CCM team was consulted for assistance with disease management and care coordination needs.    Engaged with patient by telephone for follow up visit in response to provider referral for pharmacy case management and/or care coordination services.   Consent to Services:  The patient was given information about Chronic Care Management services, agreed to services, and gave verbal consent prior to initiation of services.  Please see initial visit note for detailed documentation.   Patient Care Team: [name], NP as PCP - General Nahser, Wonda Cheng, MD as PCP - Cardiology (Cardiology) Elsie Stain, MD as Attending Physician (Pulmonary Disease) Virgina Evener, OD (Optometry) Cherre Robins, PharmD (Pharmacist) Sharmon Revere as Physician Assistant (Cardiology)  Recent office visits: 07/13/2021 - PCP Inda Castle, NP) F/U chronic conditions. Removed furosemide from med list (patient preferance) and tramadols (completed course) 04/11/2021 - PCP Inda Castle, NP) F/U DM and HTN. Restarted Advair HFA 115/21 2 puffs twice a day; Started sildenafil 20mg  as needed for ED.  03/07/2021 - PCP Inda Castle) Acute visit for HA; HA started after hitting head on a beam on 03/04/2021; patient also reported chest pain on 5/3. CT head ordered due to taking Xarelto; CT showed no acute abnormalities and no change in 2 meningiomas previously present. 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  Continued Xarelto for Afib; Plan to  stop aspirin 81mg  3 months from CABG (October 2022 - but pt to check with surgeon). Added furosemide 20mg  daily and potassium 10 mEq daily. Recheck BMP in 2 weeks.  07/11/2021 - Cardiothoracic surgery Harriet Pho, [facility]) F/u CABG with complecation of plural effusion and left thoracenteisi 05/18/2021 and 06/07/2021. Notes suggest restarting furosemide for LEE but not added back to med list.  06/28/2021 - Cardiothoracic Surgery Harriet Pho, [facility]) F/U CABG with complication of plural effusion. Xray orderd. Patient sent for thoracentesis. Recommended continue furosemide 40mg  daily and potassium 20 mEq daily. 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 Sondra Come. Currently on observation. Repeat CT scan of the chest performed recently showed no concerning findings for disease recurrence or metastasis.  The pleural effusion is secondary to his recent cardiac surgery. Recommended cont observation with repeat chest CT in 1 year 06/11/2021 - Cardio ([place], [facility]) Left plural effusion re-accumulation per xray. Recommended continue furosemide 40mg  daily and potassium 20 mEq daily. Recheck in 2 weeks.  06/05/2021 - Cardio [place], [facility]) Hospital F/U S/P CABG. Continue Aspirin until 3 months post CABG, Stopped metoprolol due to lightheadedness and low HR. Started furosemide 40mg  daily for 7 days and as needed thereafter for weight gain > 3 lbs and potassium 20 mEq daily for 7 days and as needed when taking furosemide.  BMET checked and will be repeated in 1 week.  06/04/2021 - Cardiothoracic surgery Harriet Pho, [facility]) F/U CABG and left plural effusion. Need thoracentesis  on 05/18/2021 while in hospital. Unable to give diuretic due to low BP. Ordered US guided thoracentesis with repeat xray in 1 week.   05/28/2021 - Cardio ([place], [facility]) Afib Clinic. Noted to be bradycardic. Recommended lower dose of metoprolol to 12.5mg  bid. No other med changes.  05/24/2021 - Cardiothoracic surgery (Dr Orvan Seen) F/U CABG. No med changes.  04/18/2021 - Cardio Glenford Peers, NP) Video Visit to discuss abnormal CCTA from 04/16/2021. Showed flow limiting lesions in the mLAD, mLCX and mid diag vessel. Ordered cardiac cath. since allergy to contact dye - premedication planned wiht prednisone for Q13, Q7 and A1h before cath and benadryl 25mg  1 our before cath. Instructed to hold Xarelto prior to cath. Removed levetiracetam from med list - patinet not taking.  03/12/2021 - Cardio Glenford Peers, NP) Atypical CP. Ordered CCTA - added beta blocker for scan and also will premedicate with prednisone Q13, 7 and 1 hour before scan 2/2 h/o contract dye allergy; NTG added.   Hospital visits: 05/08/2021 to [facility] Admission at [facility] - Admitted for CABG x 3 performed on 05/08/2021. Post Op blood loss anemia with transfusion on 07/6. Medications Started: cephalexin 500mg  every 8 hours  Dulera 200/71mcg - 2 puffs twice a day  Metoprolol tartrate 25mg  twice a day  Potassium chloride 10 mEq daily for 4 days  Torsemide 20mg  take 1 tablet daily for 4 days Medications Stopped: Advair HFA 115/21 mcg and NTG SL Medication Changes: amiodarone 200mg  bid for 5 days, then once a day thereafter  04/20/2021 to 04/26/2021 [facility] Admission Cath @ [place] on 04/20/2021. Showe significant disease. He was to have bypass surgery but was found to be COVID positive. He was treated with Paxlovid. He was kept in hospital for 5 days toremain on heparin drip since he has Afib. Will return to hospital for CABG 05/08/2021. New Medication: Atorvastatin 80mg ; amiodarone 200mg  bid for 7 days, then 200mg  daily thereafter; metfromin 500mg  bid Medication stopped: pravastatin 80mg ; diltiazem Medication changes: none  Objective:  Lab Results   Component Value Date   CREATININE 0.96 08/03/2021   CREATININE 0.86 06/14/2021   CREATININE 0.86 06/05/2021    Lab Results  Component Value Date   HGBA1C 5.8 07/13/2021   Last diabetic Eye exam: No results found for: HMDIABEYEEXA  Last diabetic Foot exam: No results found for: HMDIABFOOTEX      Component Value Date/Time   CHOL 140 07/13/2021 1336   TRIG 72.0 07/13/2021 1336   HDL 67.00 07/13/2021 1336   CHOLHDL 2 07/13/2021 1336   VLDL 14.4 07/13/2021 1336   LDLCALC 58 07/13/2021 1336   LDLCALC 62 04/11/2021 1421   LDLDIRECT 142.2 12/26/2008 1032    Hepatic Function Latest Ref Rng & Units 06/14/2021 05/04/2021 12/08/2020  Total Protein 6.5 - 8.1 g/dL 5.9(L) 5.9(L) 6.1  Albumin 3.5 - 5.0 g/dL 2.8(L) 3.3(L) -  AST 15 - 41 U/L 11(L) 16 13  ALT 0 - 44 U/L 13 13 12   Alk Phosphatase 38 - 126 U/L 103 80 -  Total Bilirubin 0.3 - 1.2 mg/dL 0.4 0.5 0.4  Bilirubin, Direct 0.0 - 0.3 mg/dL - - -    Lab Results  Component Value Date/Time   TSH 4.26 08/03/2021 08:25 AM   TSH 1.31 11/08/2019 02:55 PM    CBC Latest Ref Rng & Units 06/14/2021 05/17/2021 05/15/2021  WBC 4.0 - 10.5 K/uL 6.8 9.3 9.4  Hemoglobin 13.0 - 17.0 g/dL 10.4(L) 9.5(L) 9.5(L)  Hematocrit 39.0 - 52.0 % 32.4(L) 29.9(L) 29.7(L)  Platelets 150 - 400 K/uL 247 302 268    No results found for: VD25OH  Clinical ASCVD: Yes  The ASCVD Risk score (Arnett DK, et al., 2019) failed to calculate for the following reasons:   The 2019 ASCVD risk score is only valid for ages 66 to 57    Other: CHADS2VASc = 5   Social History   Tobacco Use  Smoking Status Former   Packs/day: 1.00   Years: 57.00   Pack years: 57.00   Types: Cigarettes, Cigars   Quit date: 08/08/2015   Years since quitting: 6.0  Smokeless Tobacco Former   Types: Chew   Quit date: 11/04/1958  Tobacco Comments   occasional use sneaks around   BP Readings from Last 3 Encounters:  08/07/21 114/68  07/20/21 120/66  07/13/21 122/64   Pulse Readings from  Last 3 Encounters:  08/07/21 69  07/20/21 67  07/13/21 66   Wt Readings from Last 3 Encounters:  08/07/21 166 lb (75.3 kg)  07/20/21 165 lb 12.8 oz (75.2 kg)  07/13/21 164 lb (74.4 kg)    Assessment: Review of patient past medical history, allergies, medications, health status, including review of consultants reports, laboratory and other test data, was performed as part of comprehensive evaluation and provision of chronic care management services.   SDOH:  (Social Determinants of Health) assessments and interventions performed:      CCM Care Plan  Allergies  Allergen Reactions   Iodine Other (See Comments)    neck swells   Iohexol Swelling    Neck and gland swelling per patient.    Medications Reviewed Today     Reviewed by Cherre Robins, PharmD (Pharmacist) on 08/13/21 at 1033  Med List Status: <None>   Medication Order Taking? Sig Documenting Provider Last Dose Status Informant  acetaminophen (TYLENOL) 500 MG tablet [id number] Yes Take 1-2 tablets (500-1,000 mg total) by mouth every 6 (six) hours as needed. Lars Pinks M, PA-C Taking Active   albuterol (VENTOLIN HFA) 108 (90 Base) MCG/ACT inhaler [id number] Yes Inhale 2 puffs into the lungs every 6 (six) hours as needed for wheezing or shortness of breath. [name], NP Taking Active   amiodarone (PACERONE) 200 MG tablet [id number] Yes Take 200 mg by mouth daily. [provider] Taking Active   aspirin EC 81 MG tablet [id number] Yes Take 1 tablet (81 mg total) by mouth in the morning. Swallow whole. Nani Skillern, PA-C Taking Active   atorvastatin (LIPITOR) 80 MG tablet [id number] Yes Take 1 tablet (80 mg total) by mouth daily. Margie Billet, NP Taking Active   fluticasone-salmeterol (ADVAIR HFA) 465-03 MCG/ACT inhaler [id number] Yes Inhale 2 puffs into the lungs 2 (two) times daily. [name], NP Taking Active   furosemide (LASIX) 20 MG tablet [id number] Yes Take 1 tablet (20 mg total) by  mouth daily. Richardson Dopp T, [place] Taking Active     Discontinued 05/08/21 1058 Multiple Vitamin (MULTIVITAMIN WITH MINERALS) TABS tablet [id number] Yes Take 1 tablet by mouth daily in the afternoon. [provider] Taking Active   omeprazole (PRILOSEC) 40 MG capsule [id number] Yes Take 1 capsule (40 mg total) by mouth daily. [name], NP Taking Active Self  potassium chloride (KLOR-CON) 10 MEQ tablet [id number] Yes Take 1 tablet (10 mEq total) by mouth daily. Richardson Dopp T, PA-C Taking Active   rivaroxaban (XARELTO) 20 MG TABS tablet [id number] Yes TAKE 1 TABLET BY MOUTH DAILY WITH SUPPER Nahser, Wonda Cheng, MD Taking Active   tamsulosin [facility]) 0.4  MG CAPS capsule [id number] Yes TAKE 1 CAPSULE (0.4 MG TOTAL) BY MOUTH DAILY. [name], NP Taking Active   Tiotropium Bromide Monohydrate (SPIRIVA RESPIMAT) 2.5 MCG/ACT AERS [id number] Yes Inhale 2 puffs into the lungs daily. [provider] Taking Active             Patient Active Problem List   Diagnosis Date Noted   Persistent atrial fibrillation ([place]) 05/28/2021   Secondary hypercoagulable state ([place]) 05/28/2021   Malnutrition of moderate degree 05/15/2021   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   Coronary artery disease 05/08/2021   COVID-19 virus infection 04/23/2021   Unstable angina (HCC) 04/20/2021   Erectile dysfunction 04/11/2021   Head trauma 03/07/2021   Left leg pain 04/07/2020   Benign prostatic hyperplasia with nocturia 03/08/2020   Seizures ([place]) 02/18/2020   PAF (paroxysmal atrial fibrillation) ([place]) 08/15/2017   Diabetes type 2, controlled ([place]) 07/31/2017   COPD GOLD II  04/03/2017   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[place]) 09/06/2015   Lung cancer ([place]) 11/07/2014   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   HTN (hypertension) 04/29/2014   Meningioma ([place]) 10/25/2013   Low back pain 10/25/2013   Osteoarthritis 08/18/2012   Atypical chest pain 08/14/2011   KERATOSIS 10/09/2010    SCIATICA, RIGHT 10/09/2010   UNSPECIFIED HEARING LOSS 05/21/2010   Hyperlipidemia 05/14/2010   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   RENAL CYST, RIGHT 02/05/2010   Abdominal aortic aneurysm 01/29/2010   LIPOMA 11/17/2009   MICROSCOPIC HEMATURIA 08/11/2008   GERD 07/26/2008   DERMOID CYST 07/25/2008    Immunization History  Administered Date(s) Administered   Fluad Quad(high Dose 65+) 07/23/2019, 09/04/2020, 07/13/2021   Influenza Split 08/02/2011, 08/18/2012   Influenza Whole 08/17/2008   Influenza, High Dose Seasonal PF 08/09/2015, 09/30/2016, 07/30/2017, 08/07/2018   Influenza,inj,Quad PF,6+ Mos 07/27/2013, 08/09/2014   PFIZER Comirnaty(Gray Top)Covid-19 Tri-Sucrose Vaccine 02/16/2021   PFIZER(Purple Top)SARS-COV-2 Vaccination 01/28/2020, 02/21/2020, 09/04/2020   Pfizer Covid-19 Vaccine Bivalent Booster 59yrs & up 08/03/2021   Pneumococcal Conjugate-13 08/09/2014   Pneumococcal Polysaccharide-23 05/14/2010   Td 07/25/2008    Conditions to be addressed/monitored: Atrial Fibrillation, HTN, HLD, COPD and BPH; type 2 DM; GERD; seizure, aortic aneurysm.  Care Plan : General Pharmacy (Adult)  Updates made by Cherre Robins, PHARMD since 08/13/2021 12:00 AM     Problem: Management of chronic medicaiton conditions and medication: Atrial Fibrillation, HTN, HLD, type 2 DM, Pulmonary Disease and BPH; seizures; h/o lung cancer and meningioma; GERD; aortic aneurysm   Priority: High  Onset Date: 02/08/2021  Note:   Current Barriers:  Does not adhere to prescribed medication regimen Management of chronic medical conditions and medication management Unable to afford medication therapy  Pharmacist Clinical Goal(s):  Over the next 180 days, patient will achieve adherence to monitoring guidelines and medication adherence to achieve therapeutic efficacy contact provider office for questions/concerns as evidenced notation of same in electronic health record through collaboration with PharmD  and provider.   Interventions: 1:1 collaboration with [name], NP regarding development and update of comprehensive plan of care as evidenced by provider attestation and co-signature Inter-disciplinary care team collaboration (see longitudinal plan of care) Comprehensive medication review performed; medication list updated in electronic medical record  Diabetes: Controlled; A1c goal < 7.0% Last A1c was 5.8 % (07/13/2021) - has not taken metformin in over 3 months.  Current treatment:  none Current glucose readings: does not check BG at home Current meal patterns: eating a little better since CABG 05/2021. He has  been out of work so eating out less. 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  Interventions:  Educated on limiting high CHO foods; recommended increase non starchy vegetables Counseled on A1c goal.   Hypertension (BP goal <130/80) / CHF with preserved EF Controlled  Crrent treatment:  Furosemide 20mg  daily (lowered from 40mg ) Potassium chloride 10 mEq daily (lowered from 20 mEq)  Previously took diltiazem but stopped during hospitalization when amiodarone was started; tried metoprolol - bradycardia Patient checks BP at home infrequently - no readings provided EF was 60 to 65% 04/2021 Denies hypotensive/hypertensive symptoms Interventions: (addressed at previous visit) Educated on BP goals Recommended continue current therpay   Hyperlipidemia / CAD (LDL < 70) Controlled Recent CABG x 3 vessels 05/08/2021. Patient continues to have issues with plural effusion s/p CABG.  Followed by cardio and cardiothoracic surgery team.  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 Recommended try to increase intake of non starchy vegetables Recommended continue current therapy  Chronic Obstructive Pulmonary Disease: Controlled Taking furosemide + KCL for plural effusion; monitored by cardiothoracic surgery Current treatment:  albuterol as rescue inhaler Advair HFA 115 mcg 2 puffs twice a day Spiriva 2.5mg  respimat - inhaler two puffs into lungs daily GOLD Classification: 2 (FEV1 50-79%) Most recent Pulmonary Function Testing: 05/19/2017 - spirometry FVC 2.84 (73%) FEV1 1.74 (62%) FEV1/FVC 61% Current COPD Classification:  A (low sx, <2 exacerbations/yr)  [place] does not have patient assistance program but Advair dose (must spend $600 out of pocket on Rx medications to qualify) Interventions:  Assisted patient in completing [place] / [employer] and [place] and [place] applications - applications reviewed by PCP and returned to clinical pharmacist today. Application faxed Continue to take furosemide and potassium as directed for plural effusion Continue current regimen for COPD  Atrial Fibrillation: NSR since last hospitalization but did recently change from diltiazem to amiodarone due to being in afib prior to cardiac cath Current rate/rhythm controller: amiodarone 200mg  daily Anticoagulant treatment: Xarelto 20mg  daily with evening meal Mailed patient PAP application after CCM visit in June - patient brought in application today [id number] score: 5  Interventions:  Recommended continue current regimen Patient to have LFTs and TSH monitored by cardiologist regularly due to amiodarone therapy Financial assessment completed and verified with pharmacy that patient is in coverage gap. Also assisted patient in getting report from his pharmacy with 2022 out of pocket expenses. Assisted in completion of Xarelto patient assistance application. Applications reviewed by PCP and  returned to clinical pharmacist today. Application faxed  GERD:  Patient is currently controlled on the following medications:  Omeprazole 40mg  daily Patient has failed these meds in past: None noted  Triggers: laying down after eating Interventions: (addressed at previous visit)  Recommend continue current medications    Health Maintenance:  Reviewed vaccination history and discussed benefits of abivalent COVID booster Patient to get the following vaccines today at [facility] after our visit: bivalent COVID booster  Patient Goals/Self-Care Activities Over the next 180 days, patient will:  take medications as prescribed and collaborate with provider on medication access solutions Follow up with cardiologist and cardiothoracic  team Continue cardiac rehab program  Follow Up Plan: Telephone follow up appointment with care management team member scheduled for:  1 month                Medication Assistance: Application for Xarelto and Advair  medication assistance program. in process.  Anticipated assistance start date 08/23/2021.  See plan of care for additional detail.  Patient's preferred pharmacy is:  [facility] [address], [place] [address] Phone: [phone] Fax: [place] [address] [place] [address] Phone: [phone] Fax: [phone]   Follow Up:  Patient agrees to Care Plan and Follow-up.  Plan: Telephone follow up appointment with care management team member scheduled for:  1 month  Cherre Robins, PharmD Clinical Pharmacist [facility]

## 2021-08-13 NOTE — Patient Instructions (Signed)
Visit Information  PATIENT GOALS:  Goals Addressed             This Visit's Progress    Chronic Care Management Pharmacy Care Plan       CARE PLAN ENTRY (see longitudinal plan of care for additional care plan information)  Current Barriers:  Chronic Disease Management support, education, and care coordination needs related to Hypertension, Hyperlipidemia, Diabetes, COPD, AFib, GERD, BPH, Seizures   Hypertension BP Readings from Last 3 Encounters:  07/20/21 120/66  07/13/21 122/64  07/11/21 97/66  Pharmacist Clinical Goal(s): Over the next 90 days, patient will work with PharmD and providers to maintain BP goal <140/90 Current regimen:  none Interventions: Discussed blood pressure goal Patient self care activities - Over the next 90 days, patient will: Checked blood pressure at home 2 to 3 times per week and record for future visits  Hyperlipidemia / Heart disease - recent bypass surgery Lab Results  Component Value Date/Time   LDLCALC 58 07/13/2021 01:36 PM   [place] 62 04/11/2021 02:21 PM   LDLDIRECT 142.2 12/26/2008 10:32 AM  Pharmacist Clinical Goal(s): Over the next 90 days, patient will work with PharmD and providers to maintain LDL goal < 100 Current regimen:  Atorvastatin 80mg  daily Aspirin 81mg  daily (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 Recommended try to increase intake of non starchy vegetables Patient self care activities - Over the next 90 days, patient will: Maintain cholesterol medication regimen.  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 Increase intake of non starchy vegetables Recommended continue current therapy  Diabetes Lab Results  Component Value Date/Time   HGBA1C 5.8 07/13/2021 01:36 PM   HGBA1C 6.5 04/11/2021 01:41 PM  Pharmacist Clinical  Goal(s): Over the next 90 days, patient will work with PharmD and providers to maintain A1c goal <7% Current regimen:  Diet and exercise recommeded Interventions: Discussed A1c goal Patient self care activities - Over the next 90 days, patient will: Maintain A1c <7% Limit intake of sugar, potatoes, pasta, rice and bread.  Consider restarting metformin 500mg  twice a day if next A1c is 6.5% or higher  COPD Pharmacist Clinical Goal(s) Over the next 90 days, patient will work with PharmD and providers to reduce symptoms associated with COPD and reduce barriers to access of preferred medication regimens Current regimen:  Albuterol HFA 2 puffs every 6 hours as needed for wheezing Advair HFA 115 mcg Inhale 2 puffs twice a day Spiriva 2.5mg  respimat - inhaler two puffs into lungs daily Interventions: Discussed patient's preference for Advair over [place] patient I completing applications for [place] [employer] [place] and [employer] and getting 2022 out of pocket expenses for medications - faxed today Patient self care activities - Over the next 90 days, patient will: Maintain medication regimen for COPD  Atrial fibrillation Pharmacist Clinical Goal(s) Over the next 90 days, patient will work with PharmD and providers to reduce risk of stroke associated with Afib and reduce barriers to preferred medication regimen Current regimen:  Amiodarone 200mg  daily  Xarelto 20mg  daily Interventions: Assisted patient to completed patient assistance application for Xarelto - faxed today Confirmed patient is in Medicare coverage gap Patient self care activities - Over the next 90 days, patient will: Maintain current medication regimen for atrial fibrillation Continue to have liver and thyroid check every 6 months  while on amiodarone therapy   Medication management Pharmacist Clinical Goal(s): Over the next 90 days, patient will work with PharmD and providers to maintain optimal medication adherence Current  pharmacy: [facility] Interventions Comprehensive medication review performed. Continue current medication management strategy Patient self care activities - Over the next 90 days, patient will: Focus on medication adherence by filling and taking medications appropriately  Take medications as prescribed Report any questions or concerns to PharmD and/or provider(s)  Health Maintenance:  Reviewed vaccination history and discussed benefits of abivalent COVID booster Patient to get the following vaccines today at [place] after our visit: bivalent COVID booster  Patient Goals/Self-Care Activities Over the next 180 days, patient will:  Take medications as prescribed and collaborate with provider on medication access solutions Follow up with cardiologist and cardiothoracic teams Continue cardiac rehab program Complete and return patient assistance applications to clinical pharmacist  Please see past updates related to this goal by clicking on the "Past Updates" button in the selected goal          The patient verbalized understanding of instructions, educational materials, and care plan provided today and declined offer to receive copy of patient instructions, educational materials, and care plan.   Telephone follow up appointment with care management team member scheduled for: 1 month  Cherre Robins, PharmD Clinical Pharmacist [facility] [phone]

## 2021-08-14 ENCOUNTER — Other Ambulatory Visit (HOSPITAL_BASED_OUTPATIENT_CLINIC_OR_DEPARTMENT_OTHER): Payer: Self-pay

## 2021-08-14 MED ORDER — COVID-19MRNA BIVAL VACC PFIZER 30 MCG/0.3ML IM SUSP
INTRAMUSCULAR | 0 refills | Status: DC
  Filled 2021-08-14: qty 0.3, 1d supply, fill #0

## 2021-08-22 ENCOUNTER — Other Ambulatory Visit (HOSPITAL_BASED_OUTPATIENT_CLINIC_OR_DEPARTMENT_OTHER): Payer: Self-pay

## 2021-08-22 ENCOUNTER — Ambulatory Visit: Payer: Medicare Other | Admitting: Pharmacist

## 2021-08-22 ENCOUNTER — Telehealth: Payer: Medicare Other

## 2021-08-22 ENCOUNTER — Other Ambulatory Visit: Payer: Self-pay | Admitting: Cardiovascular Disease

## 2021-08-22 DIAGNOSIS — I2510 Atherosclerotic heart disease of native coronary artery without angina pectoris: Secondary | ICD-10-CM

## 2021-08-22 DIAGNOSIS — E785 Hyperlipidemia, unspecified: Secondary | ICD-10-CM

## 2021-08-22 DIAGNOSIS — J449 Chronic obstructive pulmonary disease, unspecified: Secondary | ICD-10-CM

## 2021-08-22 DIAGNOSIS — I4891 Unspecified atrial fibrillation: Secondary | ICD-10-CM

## 2021-08-22 DIAGNOSIS — E119 Type 2 diabetes mellitus without complications: Secondary | ICD-10-CM

## 2021-08-22 MED FILL — Amiodarone HCl Tab 200 MG: ORAL | 30 days supply | Qty: 35 | Fill #0 | Status: AC

## 2021-08-22 MED FILL — Rivaroxaban Tab 20 MG: ORAL | 30 days supply | Qty: 30 | Fill #4 | Status: AC

## 2021-08-22 NOTE — Chronic Care Management (AMB) (Signed)
Chronic Care Management Pharmacy Note  08/22/2021 Name:  Robert Bates MRN:  [id number] DOB:  Dec 15, 1940  Summary;  Verified he has been approved for Xarelto patient assistance and called pharmacy with processing information (has card to use at pharmacy but patient has not received yet.) Called [facility] regarding Advair patient assistance. Sent for review of OOP expenses.   Subjective: Robert Bates is an 80 y.o. year old male who is a primary patient of [name], NP.  The CCM team was consulted for assistance with disease management and care coordination needs.    Engaged with patient by telephone for follow up visit in response to provider referral for pharmacy case management and/or care coordination services.   Consent to Services:  The patient was given information about Chronic Care Management services, agreed to services, and gave verbal consent prior to initiation of services.  Please see initial visit note for detailed documentation.   Patient Care Team: [name], NP as PCP - General Nahser, Wonda Cheng, MD as PCP - Cardiology (Cardiology) Elsie Stain, MD as Attending Physician (Pulmonary Disease) Virgina Evener, OD (Optometry) Cherre Robins, PharmD (Pharmacist) Sharmon Revere as Physician Assistant (Cardiology)  Recent office visits: 07/13/2021 - PCP Inda Castle, NP) F/U chronic conditions. Removed furosemide from med list (patient preferance) and tramadols (completed course) 04/11/2021 - PCP Inda Castle, NP) F/U DM and HTN. Restarted Advair HFA 115/21 2 puffs twice a day; Started sildenafil 20mg  as needed for ED.  03/07/2021 - PCP Inda Castle) Acute visit for HA; HA started after hitting head on a beam on 03/04/2021; patient also reported chest pain on 5/3. CT head ordered due to taking Xarelto; CT showed no acute abnormalities and no change in 2 meningiomas previously present. 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  Continued Xarelto for Afib; Plan to stop aspirin 81mg  3 months from CABG (October 2022 - but pt to check with surgeon). Added furosemide 20mg  daily and potassium 10 mEq daily. Recheck BMP in 2 weeks.  07/11/2021 - Cardiothoracic surgery Harriet Pho, [facility]) F/u CABG with complecation of plural effusion and left thoracenteisi 05/18/2021 and 06/07/2021. Notes suggest restarting furosemide for LEE but not added back to med list.  06/28/2021 - Cardiothoracic Surgery Harriet Pho, [facility]) F/U CABG with complication of plural effusion. Xray orderd. Patient sent for thoracentesis. Recommended continue furosemide 40mg  daily and potassium 20 mEq daily. 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 Sondra Come. Currently on observation. Repeat CT scan of the chest performed recently showed no concerning findings for disease recurrence or metastasis.  The pleural effusion is secondary to his recent cardiac surgery. Recommended cont observation with repeat chest CT in 1 year 06/11/2021 - Cardio ([place], [facility]) Left plural effusion re-accumulation per xray. Recommended continue furosemide 40mg  daily and potassium 20 mEq daily. Recheck in 2 weeks.  06/05/2021 - Cardio [place], [facility]) Hospital F/U S/P CABG. Continue Aspirin until 3 months post CABG, Stopped metoprolol due to lightheadedness and low HR. Started furosemide 40mg  daily for 7 days and as needed thereafter for weight gain > 3 lbs and potassium 20 mEq daily for 7 days and as needed when taking furosemide.  BMET checked and will be repeated in 1 week.  06/04/2021 - Cardiothoracic surgery Harriet Pho, [facility]) F/U CABG and left plural effusion. Need thoracentesis on 05/18/2021 while in hospital.  Unable to give diuretic due to low BP. Ordered US guided thoracentesis with repeat xray in 1 week.  05/28/2021 - Cardio ([place], PAC) Afib Clinic. Noted to be bradycardic. Recommended lower dose of metoprolol to 12.36m bid. No other med changes.  05/24/2021 - Cardiothoracic surgery (Dr AOrvan Seen F/U CABG. No med changes.  04/18/2021 - Cardio (Glenford Peers NP) Video Visit to discuss abnormal CCTA from 04/16/2021. Showed flow limiting lesions in the mLAD, mLCX and mid diag vessel. Ordered cardiac cath. since allergy to contact dye - premedication planned wiht prednisone for Q13, Q7 and A1h before cath and benadryl 285m1 our before cath. Instructed to hold Xarelto prior to cath. Removed levetiracetam from med list - patinet not taking.  03/12/2021 - Cardio (MGlenford PeersNP) Atypical CP. Ordered CCTA - added beta blocker for scan and also will premedicate with prednisone Q13, 7 and 1 hour before scan 2/2 h/o contract dye allergy; NTG added.   [facility]: [facility] for [facility]G x 3 performed on 05/08/2021. Post Op blood loss anemia with transfusion on 07/6. Medications Started: cephalexin 50032mvery 8 hours  Dulera 200/5mc25m 2 puffs twice a day  Metoprolol tartrate 25mg57mce a day  Potassium chloride 10 mEq daily for 4 days  Torsemide 20mg 66m 1 tablet daily for 4 days Medications Stopped: Advair HFA 115/21 mcg and NTG SL Medication Changes: amiodarone 200mg b46mor 5 days, then once a day thereafter  [facility] Showe significant disease. He was to have bypass surgery but was found to be COVID positive. He was treated with Paxlovid. He was kept in hospital for 5 days toremain on heparin drip since he has Afib. Will return to hospital for CABG 05/08/2021. New Medication: Atorvastatin 80mg; a65marone 200mg bid37m 7 days, then 200mg dail71mereafter; metfromin 500mg bid M64mation  stopped: pravastatin 80mg; dilti6m Medication changes: none  Objective:  Lab Results  Component Value Date   CREATININE 0.96 08/03/2021   CREATININE 0.86 06/14/2021   CREATININE 0.86 06/05/2021    Lab Results  Component Value Date   HGBA1C 5.8 07/13/2021   Last diabetic Eye exam: No results found for: HMDIABEYEEXA  Last diabetic Foot exam: No results found for: HMDIABFOOTEX      Component Value Date/Time   CHOL 140 07/13/2021 1336   TRIG 72.0 07/13/2021 1336   HDL 67.00 07/13/2021 1336   CHOLHDL 2 07/13/2021 1336   VLDL 14.4 07/13/2021 1336   LDLCALC 58 07/13/2021 1336   LDLCALC 62 04/11/2021 1421   LDLDIRECT 142.2 12/26/2008 1032    Hepatic Function Latest Ref Rng & Units 06/14/2021 05/04/2021 12/08/2020  Total Protein 6.5 - 8.1 g/dL 5.9(L) 5.9(L) 6.1  Albumin 3.5 - 5.0 g/dL 2.8(L) 3.3(L) -  AST 15 - 41 U/L 11(L) 16 13  ALT 0 - 44 U/L _0 Alk Phosphatase 38 - 126 U/L 103 80 -  Total Bilirubin 0.3 - 1.2 mg/dL 0.4 0.5 0.4  Bilirubin, Direct 0.0 - 0.3 mg/dL - - -    Lab Results  Component Value Date/Time   TSH 4.26 08/03/2021 08:25 AM   TSH 1.31 11/08/2019 02:55 PM    CBC Latest Ref Rng & Units 06/14/2021 05/17/2021 05/15/2021  WBC 4.0 - 10.5 K/uL 6.8 9.3 9.4  Hemoglobin 13.0 - 17.0  g/dL 10.4(L) 9.5(L) 9.5(L)  Hematocrit 39.0 - 52.0 % 32.4(L) 29.9(L) 29.7(L)  Platelets 150 - 400 K/uL 247 302 268    No results found for: VD25OH  Clinical ASCVD: Yes  The ASCVD Risk score (Arnett DK, et al., 2019) failed to calculate for the following reasons:   The 2019 ASCVD risk score is only valid for ages 47 to 64    Other: CHADS2VASc = 5   Social History   Tobacco Use  Smoking Status Former   Packs/day: 1.00   Years: 57.00   Pack years: 57.00   Types: Cigarettes, Cigars   Quit date: 08/08/2015   Years since quitting: 6.0  Smokeless Tobacco Former   Types: Chew   Quit date: 11/04/1958  Tobacco Comments   occasional use sneaks around   BP Readings from Last 3  Encounters:  08/07/21 114/68  07/20/21 120/66  07/13/21 122/64   Pulse Readings from Last 3 Encounters:  08/07/21 69  07/20/21 67  07/13/21 66   Wt Readings from Last 3 Encounters:  08/07/21 166 lb (75.3 kg)  07/20/21 165 lb 12.8 oz (75.2 kg)  07/13/21 164 lb (74.4 kg)    Assessment: Review of patient past medical history, allergies, medications, health status, including review of consultants reports, laboratory and other test data, was performed as part of comprehensive evaluation and provision of chronic care management services.   SDOH:  (Social Determinants of Health) assessments and interventions performed:      CCM Care Plan  Allergies  Allergen Reactions   Iodine Other (See Comments)    neck swells   Iohexol Swelling    Neck and gland swelling per patient.    Medications Reviewed Today     Reviewed by Cherre Robins, PharmD (Pharmacist) on 08/22/21 at 57  Med List Status: <None>   Medication Order Taking? Sig Documenting Provider Last Dose Status Informant  acetaminophen (TYLENOL) 500 MG tablet [id number] Yes Take 1-2 tablets (500-1,000 mg total) by mouth every 6 (six) hours as needed. Lars Pinks M, PA-C Taking Active   albuterol (VENTOLIN HFA) 108 (90 Base) MCG/ACT inhaler [id number] Yes Inhale 2 puffs into the lungs every 6 (six) hours as needed for wheezing or shortness of breath. [name], NP Taking Active   amiodarone (PACERONE) 200 MG tablet [id number] Yes Take 200 mg by mouth daily. [provider] Taking Active   aspirin EC 81 MG tablet [id number] Yes Take 1 tablet (81 mg total) by mouth in the morning. Swallow whole. Nani Skillern, PA-C Taking Active   atorvastatin (LIPITOR) 80 MG tablet [id number] Yes Take 1 tablet (80 mg total) by mouth daily. Margie Billet, NP Taking Active   fluticasone-salmeterol (ADVAIR HFA) 287-86 MCG/ACT inhaler [id number] Yes Inhale 2 puffs into the lungs 2 (two) times daily. [name], NP  Taking Active   furosemide (LASIX) 20 MG tablet [id number] Yes Take 1 tablet (20 mg total) by mouth daily. Richardson Dopp T, [place] Taking Active     Discontinued 05/08/21 1058 Multiple Vitamin (MULTIVITAMIN WITH MINERALS) TABS tablet [id number] Yes Take 1 tablet by mouth daily in the afternoon. [provider] Taking Active   omeprazole (PRILOSEC) 40 MG capsule [id number] Yes Take 1 capsule (40 mg total) by mouth daily. [name], NP Taking Active Self  potassium chloride (KLOR-CON) 10 MEQ tablet [id number] Yes Take 1 tablet (10 mEq total) by mouth daily. Richardson Dopp T, PA-C Taking Active   rivaroxaban (XARELTO) 20 MG TABS tablet [id number] Yes TAKE 1 TABLET BY MOUTH  DAILY WITH SUPPER Nahser, Wonda Cheng, MD Taking Active   tamsulosin (FLOMAX) 0.4 MG CAPS capsule [id number] Yes TAKE 1 CAPSULE (0.4 MG TOTAL) BY MOUTH DAILY. [name], NP Taking Active   Tiotropium Bromide Monohydrate (SPIRIVA RESPIMAT) 2.5 MCG/ACT AERS [id number] Yes Inhale 2 puffs into the lungs daily. [provider] Taking Active             Patient Active Problem List   Diagnosis Date Noted   Persistent atrial fibrillation ([place]) 05/28/2021   Secondary hypercoagulable state ([place]) 05/28/2021   Malnutrition of moderate degree 05/15/2021   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   Coronary artery disease 05/08/2021   COVID-19 virus infection 04/23/2021   Unstable angina (H[facility]) 04/20/2021   Erectile dysfunction 04/11/2021   Head trauma 03/07/2021   Left leg pain 04/07/2020   Benign prostatic hyperplasia with nocturia 03/08/2020   Seizures ([place]) 02/18/2020   PAF (paroxysmal atrial fibrillation) ([place]) 08/15/2017   Diabetes type 2, controlled ([place]) 07/31/2017   COPD GOLD II  04/03/2017   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[place]) 09/06/2015   Lung cancer ([place]) 11/07/2014   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   HTN (hypertension) 04/29/2014   Meningioma ([place]) 10/25/2013   Low back pain 10/25/2013    Osteoarthritis 08/18/2012   Atypical chest pain 08/14/2011   KERATOSIS 10/09/2010   SCIATICA, RIGHT 10/09/2010   UNSPECIFIED HEARING LOSS 05/21/2010   Hyperlipidemia 05/14/2010   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   RENAL CYST, RIGHT 02/05/2010   Abdominal aortic aneurysm 01/29/2010   LIPOMA 11/17/2009   MICROSCOPIC HEMATURIA 08/11/2008   GERD 07/26/2008   DERMOID CYST 07/25/2008    Immunization History  Administered Date(s) Administered   Fluad Quad(high Dose 65+) 07/23/2019, 09/04/2020, 07/13/2021   Influenza Split 08/02/2011, 08/18/2012   Influenza Whole 08/17/2008   Influenza, High Dose Seasonal PF 08/09/2015, 09/30/2016, 07/30/2017, 08/07/2018   Influenza,inj,Quad PF,6+ Mos 07/27/2013, 08/09/2014   PFIZER Comirnaty(Gray Top)Covid-19 Tri-Sucrose Vaccine 02/16/2021   PFIZER(Purple Top)SARS-COV-2 Vaccination 01/28/2020, 02/21/2020, 09/04/2020   Pfizer Covid-19 Vaccine Bivalent Booster 36yrs & up 08/03/2021   Pneumococcal Conjugate-13 08/09/2014   Pneumococcal Polysaccharide-23 05/14/2010   Td 07/25/2008    Conditions to be addressed/monitored: Atrial Fibrillation, HTN, HLD, COPD and BPH; type 2 DM; GERD; seizure, aortic aneurysm.  Care Plan : General Pharmacy (Adult)  Updates made by Cherre Robins, PHARMD since 08/22/2021 12:00 AM     Problem: Management of chronic medicaiton conditions and medication: Atrial Fibrillation, HTN, HLD, type 2 DM, Pulmonary Disease and BPH; seizures; h/o lung cancer and meningioma; GERD; aortic aneurysm   Priority: High  Onset Date: 02/08/2021  Note:   Current Barriers:  Does not adhere to prescribed medication regimen Management of chronic medical conditions and medication management Unable to afford medication therapy  Pharmacist Clinical Goal(s):  Over the next 180 days, patient will achieve adherence to monitoring guidelines and medication adherence to achieve therapeutic efficacy contact provider office for questions/concerns as  evidenced notation of same in electronic health record through collaboration with PharmD and provider.   Interventions: 1:1 collaboration with [name], NP regarding development and update of comprehensive plan of care as evidenced by provider attestation and co-signature Inter-disciplinary care team collaboration (see longitudinal plan of care) Comprehensive medication review performed; medication list updated in electronic medical record  Diabetes: Controlled; A1c goal < 7.0% Last A1c was 5.8 % (07/13/2021) - has not taken metformin in over 3 months.  Current treatment:  none Current glucose readings: does not check BG  at home Current meal patterns: eating a little better since CABG 05/2021. He has been out of work so eating out less. 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  Interventions:  (addressed at previous visit)  Educated on limiting high CHO foods; recommended increase non starchy vegetables Counseled on A1c goal.   Hypertension (BP goal <130/80) / CHF with preserved EF Controlled  Crrent treatment:  Furosemide 20mg  daily (lowered from 40mg ) Potassium chloride 10 mEq daily (lowered from 20 mEq)  Previously took diltiazem but stopped during hospitalization when amiodarone was started; tried metoprolol - bradycardia Patient checks BP at home infrequently - no readings provided EF was 60 to 65% 04/2021 Denies hypotensive/hypertensive symptoms Interventions: (addressed at previous visit) Educated on BP goals Recommended continue current therpay   Hyperlipidemia / CAD (LDL < 70) Controlled Recent CABG x 3 vessels 05/08/2021. Patient continues to have issues with plural effusion s/p CABG.  Followed by cardio and cardiothoracic surgery team.  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 Recommended try to increase intake of non starchy vegetables Recommended continue current therapy Consider recheck lipids with next labs  Chronic Obstructive Pulmonary Disease: Controlled Taking furosemide + KCL for plural effusion; monitored by cardiothoracic surgery Current treatment:  albuterol as rescue inhaler Advair HFA 115 mcg 2 puffs twice a day Spiriva 2.5mg  respimat - inhaler two puffs into lungs daily GOLD Classification: 2 (FEV1 50-79%) Most recent Pulmonary Function Testing: 05/19/2017 - spirometry FVC 2.84 (73%) FEV1 1.74 (62%) FEV1/FVC 61% Current COPD Classification:  A (low sx, <2 exacerbations/yr)  [place] does not have patient assistance program but Advair dose (must spend $600 out of pocket on Rx medications to qualify.  Interventions:  Patient application for medication assistance for Advair was faxed 2 weeks ago. Called [employer] patient assistance program and was told he has not met OOP spend however we submitted reports that he has met the $600 OOP spend. Representative reviewed pharmacy report and agreed that he should have qualified. She is resubmitting application for 2nd review.  Will have decision in 48 to 72 hours Continue to take furosemide and potassium as directed for plural effusion Continue current regimen for COPD  Atrial Fibrillation: NSR since last hospitalization but did recently change from diltiazem to amiodarone due to being in afib prior to cardiac cath Current rate/rhythm controller: amiodarone 200mg  daily Anticoagulant treatment: Xarelto 20mg  daily with evening meal Mailed patient PAP application after CCM visit in June - patient brought in application today [id number] score: 5  Interventions:  Recommended continue current regimen Patient to have LFTs and TSH monitored by cardiologist regularly due to amiodarone  therapy Coordinated with patient assistance program to verify he was approved for Xarelto assistance through 11/03/2021. Patient has been mailed card to use at pharmacy for free Xarelto. I was also faxed a copy and called [facility] pharmacy to give the information to fill Xarelto now.  Coordinated with pharmacy for amiodarone refill  GERD:  Patient is currently controlled on the following medications:  Omeprazole 40mg  daily Patient has failed these meds in past: None noted  Triggers: laying down after eating Interventions: (addressed at previous visit)  Recommend continue current medications    Health Maintenance:  Reviewed vaccination history and discussed benefits  of Shingrix and Tdap vaccines Has received both annual flu and COVID booster.  Patient to get the following vaccines in 2023 - Shingrix and Tdap vaccines  Patient Goals/Self-Care Activities Over the next 180 days, patient will:  take medications as prescribed and collaborate with provider on medication access solutions Follow up with cardiologist and cardiothoracic team Continue cardiac rehab program  Follow Up Plan: Telephone follow up appointment with care management team member scheduled for:  1 month                Medication Assistance: Application for Advair  medication assistance program. in process.  Anticipated assistance start date 09/04/2021.  See plan of care for additional detail. Approved for Xarelto patient assistance thru 11/03/2021  Patient's preferred pharmacy is:  [facility] [address], [place] [address] Phone: [phone] Fax: [place] [address] [place] [address] Phone: [phone] Fax: [phone]   Follow Up:  Patient agrees to Care Plan and Follow-up.  Plan: Telephone follow up appointment with care management team member scheduled for:  1 month  Cherre Robins,  PharmD Clinical Pharmacist [facility]

## 2021-08-22 NOTE — Patient Instructions (Signed)
Visit Information  PATIENT GOALS:  Goals Addressed             This Visit's Progress    Chronic Care Management Pharmacy Care Plan   On track    [place] (see longitudinal plan of care for additional care plan information)  Current Barriers:  Chronic Disease Management support, education, and care coordination needs related to Hypertension, Hyperlipidemia, Diabetes, COPD, AFib, GERD, BPH, Seizures   Hypertension BP Readings from Last 3 Encounters:  08/07/21 114/68  07/20/21 120/66  07/13/21 122/64  Pharmacist Clinical Goal(s): Over the next 90 days, patient will work with PharmD and providers to maintain BP goal <140/90 Current regimen:  Furosemide 20mg  daily (lowered from 40mg ) Potassium chloride 10 mEq daily (lowered from 20 mEq)  Interventions: Discussed blood pressure goal Patient self care activities - Over the next 90 days, patient will: Checked blood pressure at home 2 to 3 times per week and record for future visits Continue current medications for heart and blood pressure    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 Recommended try to increase intake of non starchy vegetables Patient self care activities - Over the next 90 days, patient will: Maintain cholesterol medication regimen.  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 Increase intake of non starchy vegetables Recommended continue current therapy  Diabetes Lab Results  Component Value Date/Time   HGBA1C 5.8 07/13/2021 01:36 PM    HGBA1C 6.5 04/11/2021 01:41 PM  Pharmacist Clinical Goal(s): Over the next 90 days, patient will work with PharmD and providers to maintain A1c goal <7% Current regimen:  Diet and exercise recommeded Interventions: Discussed A1c goal Patient self care activities - Over the next 90 days, patient will: Maintain A1c <7% Limit intake of sugar, potatoes, pasta, rice and bread.  Consider restarting metformin 500mg  twice a day if next A1c is 6.5% or higher  COPD Pharmacist Clinical Goal(s) Over the next 90 days, patient will work with PharmD and providers to reduce symptoms associated with COPD and reduce barriers to access of preferred medication regimens Current regimen:  Albuterol HFA 2 puffs every 6 hours as needed for wheezing Advair HFA 115 mcg Inhale 2 puffs twice a day Spiriva 2.5mg  respimat - inhaler two puffs into lungs daily (only using as needed)  Interventions: Discussed patient's preference for Advair over [employer] medication assistance for Advair / Glaxo - asked for re-evaluation of out of pocket expenses / application Patient self care activities - Over the next 90 days, patient will: Maintain medication regimen for COPD Discussed maintenance inhalers - Advair and Spiriva should be taken EVERY day Albuterol inhaler is to be used only if needed (rescue inhaler)   Atrial fibrillation Pharmacist Clinical Goal(s) Over the next 90 days, patient will work with PharmD and providers to reduce risk of stroke associated with Afib and reduce barriers to preferred medication regimen Current regimen:  Amiodarone 200mg  daily  Xarelto 20mg  daily Interventions: Called patient assistance program - patient has been approved for Xarelto through 11/03/2021 and will receive card  to use at pharmacy. Patient self care activities - Over the next 90 days, patient will: Maintain current medication regimen for atrial fibrillation Continue to have liver and thyroid check every 6 months while  on amiodarone therapy  Assisted in reorder for amiodarone and gave pharmacy information for Xarelto patient assistance  Medication management Pharmacist Clinical Goal(s): Over the next 90 days, patient will work with PharmD and providers to maintain optimal medication adherence Current pharmacy: [facility] Interventions Comprehensive medication review performed. Continue current medication management strategy Patient self care activities - Over the next 90 days, patient will: Focus on medication adherence by filling and taking medications appropriately  Take medications as prescribed Report any questions or concerns to PharmD and/or provider(s)  Health Maintenance:  Reviewed vaccination history and discussed benefits of tetanus and Shingrix vaccine Patient to get tetanus and Shingrix vaccine in 2023.   Patient Goals/Self-Care Activities Over the next 180 days, patient will:  Take medications as prescribed and collaborate with provider on medication access solutions Follow up with cardiologist and cardiothoracic teams Continue cardiac rehab program Complete and return patient assistance applications to clinical pharmacist  Please see past updates related to this goal by clicking on the "Past Updates" button in the selected goal          The patient verbalized understanding of instructions, educational materials, and care plan provided today and declined offer to receive copy of patient instructions, educational materials, and care plan.   1 months  Cherre Robins, PharmD Clinical Pharmacist [facility]

## 2021-08-23 ENCOUNTER — Other Ambulatory Visit (HOSPITAL_BASED_OUTPATIENT_CLINIC_OR_DEPARTMENT_OTHER): Payer: Self-pay

## 2021-08-28 ENCOUNTER — Telehealth: Payer: Self-pay | Admitting: Pharmacist

## 2021-08-28 ENCOUNTER — Ambulatory Visit: Payer: Medicare Other | Admitting: Pharmacist

## 2021-08-28 DIAGNOSIS — J449 Chronic obstructive pulmonary disease, unspecified: Secondary | ICD-10-CM

## 2021-08-28 DIAGNOSIS — I4891 Unspecified atrial fibrillation: Secondary | ICD-10-CM

## 2021-08-28 DIAGNOSIS — I2510 Atherosclerotic heart disease of native coronary artery without angina pectoris: Secondary | ICD-10-CM

## 2021-08-28 NOTE — Telephone Encounter (Signed)
Opened in error

## 2021-08-28 NOTE — Chronic Care Management (AMB) (Signed)
  Chronic Care Management Pharmacy Note  08/28/2021 Name:  Robert Bates MRN:  [id number] DOB:  10/28/1941  Summary;  Verified he has been approved for [employer] / Advair patient assistance. Approved 08/22/2021 thru 11/03/2021. Shipment sent 08/27/2021.  Patient has refilled amiodarone and atorvastatin recently.   Subjective: Robert Bates is an 80 y.o. year old male who is a primary patient of O'Sullivan, Melissa, NP.  The CCM team was consulted for assistance with disease management and care coordination needs.    Collaboration with patient and [employer] patient assistance program  for  follow up of patient assistance application for Advair  in response to provider referral for pharmacy case management and/or care coordination services.   Consent to Services:  The patient was given information about Chronic Care Management services, agreed to services, and gave verbal consent prior to initiation of services.  Please see initial visit note for detailed documentation.   Patient Care Team: O'Sullivan, Melissa, NP as PCP - General Nahser, Philip J, MD as PCP - Cardiology (Cardiology) Wright, Patrick E, MD as Attending Physician (Pulmonary Disease) Patel, Niyati, OD (Optometry) Eckard, Tammy, PharmD (Pharmacist) Weaver, Scott T, PA-C as Physician Assistant (Cardiology)  Recent office visits: 07/13/2021 - PCP (O'Sullivan, NP) F/U chronic conditions. Removed furosemide from med list (patient preferance) and tramadols (completed course) 04/11/2021 - PCP (O'Sullivan, NP) F/U DM and HTN. Restarted Advair HFA 115/21 2 puffs twice a day; Started sildenafil 20mg as needed for ED.  03/07/2021 - PCP (O'Sullivan) Acute visit for HA; HA started after hitting head on a beam on 03/04/2021; patient also reported chest pain on 5/3. CT head ordered due to taking Xarelto; CT showed no acute abnormalities and no change in 2 meningiomas previously present. 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  Continued Xarelto for Afib; Plan to stop aspirin 81mg 3 months from CABG (October 2022 - but pt to check with surgeon). Added furosemide 20mg daily and potassium 10 mEq daily. Recheck BMP in 2 weeks.  07/11/2021 - Cardiothoracic surgery (Conte, PAC) F/u CABG with complecation of plural effusion and left thoracenteisi 05/18/2021 and 06/07/2021. Notes suggest restarting furosemide for LEE but not added back to med list.  06/28/2021 - Cardiothoracic Surgery (Conte, PAC) F/U CABG with complication of plural effusion. Xray orderd. Patient sent for thoracentesis. Recommended continue furosemide 40mg daily and potassium 20 mEq daily. 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 Kinard. Currently on observation. Repeat CT scan of the chest performed recently showed no concerning findings for disease recurrence or metastasis.  The pleural effusion is secondary to his recent cardiac surgery. Recommended cont observation with repeat chest CT in 1 year 06/11/2021 - Cardio (Roddenberry, PAC) Left plural effusion re-accumulation per xray. Recommended continue furosemide 40mg daily and potassium 20 mEq daily. Recheck in 2 weeks.  06/05/2021 - Cardio (Weaver, PAC) Hospital F/U S/P CABG. Continue Aspirin until 3 months post CABG, Stopped metoprolol due to lightheadedness and low HR. Started furosemide 40mg daily for 7 days and as needed thereafter for weight gain > 3 lbs and potassium 20 mEq daily for 7 days and as needed when taking furosemide.  BMET checked and will be repeated in 1 week.  06/04/2021 -   Cardiothoracic surgery (Conte, PAC) F/U CABG and left plural effusion. Need thoracentesis on 05/18/2021 while in  hospital. Unable to give diuretic due to low BP. Ordered US guided thoracentesis with repeat xray in 1 week.  05/28/2021 - Cardio (Fenton, PAC) Afib Clinic. Noted to be bradycardic. Recommended lower dose of metoprolol to 12.5mg bid. No other med changes.  05/24/2021 - Cardiothoracic surgery (Dr Atkins) F/U CABG. No med changes.  04/18/2021 - Cardio (McDaniel, NP) Video Visit to discuss abnormal CCTA from 04/16/2021. Showed flow limiting lesions in the mLAD, mLCX and mid diag vessel. Ordered cardiac cath. since allergy to contact dye - premedication planned wiht prednisone for Q13, Q7 and A1h before cath and benadryl 25mg 1 our before cath. Instructed to hold Xarelto prior to cath. Removed levetiracetam from med list - patinet not taking.  03/12/2021 - Cardio (McDaniel, NP) Atypical CP. Ordered CCTA - added beta blocker for scan and also will premedicate with prednisone Q13, 7 and 1 hour before scan 2/2 h/o contract dye allergy; NTG added.   Hospital visits: 05/08/2021 to 05/21/2021- Hospital Admission at [facility] - Admitted for CABG x 3 performed on 05/08/2021. Post Op blood loss anemia with transfusion on 07/6. Medications Started: cephalexin 500mg every 8 hours  Dulera 200/5mcg - 2 puffs twice a day  Metoprolol tartrate 25mg twice a day  Potassium chloride 10 mEq daily for 4 days  Torsemide 20mg take 1 tablet daily for 4 days Medications Stopped: Advair HFA 115/21 mcg and NTG SL Medication Changes: amiodarone 200mg bid for 5 days, then once a day thereafter  04/20/2021 to 04/26/2021 - Hospital Admission Cath @ [facility] on 04/20/2021. Showe significant disease. He was to have bypass surgery but was found to be COVID positive. He was treated with Paxlovid. He was kept in hospital for 5 days toremain on heparin drip since he has Afib. Will return to hospital for CABG 05/08/2021. New Medication: Atorvastatin 80mg; amiodarone 200mg bid for 7 days, then 200mg daily thereafter; metfromin 500mg  bid Medication stopped: pravastatin 80mg; diltiazem Medication changes: none  Objective:  Lab Results  Component Value Date   CREATININE 0.96 08/03/2021   CREATININE 0.86 06/14/2021   CREATININE 0.86 06/05/2021    Lab Results  Component Value Date   HGBA1C 5.8 07/13/2021   Last diabetic Eye exam: No results found for: HMDIABEYEEXA  Last diabetic Foot exam: No results found for: HMDIABFOOTEX      Component Value Date/Time   CHOL 140 07/13/2021 1336   TRIG 72.0 07/13/2021 1336   HDL 67.00 07/13/2021 1336   CHOLHDL 2 07/13/2021 1336   VLDL 14.4 07/13/2021 1336   LDLCALC 58 07/13/2021 1336   LDLCALC 62 04/11/2021 1421   LDLDIRECT 142.2 12/26/2008 1032    Hepatic Function Latest Ref Rng & Units 06/14/2021 05/04/2021 12/08/2020  Total Protein 6.5 - 8.1 g/dL 5.9(L) 5.9(L) 6.1  Albumin 3.5 - 5.0 g/dL 2.8(L) 3.3(L) -  AST 15 - 41 U/L 11(L) 16 13  ALT 0 - 44 U/L 13 13 12  Alk Phosphatase 38 - 126 U/L 103 80 -  Total Bilirubin 0.3 - 1.2 mg/dL 0.4 0.5 0.4  Bilirubin, Direct 0.0 - 0.3 mg/dL - - -    Lab Results  Component Value Date/Time   TSH 4.26 08/03/2021 08:25 AM   TSH 1.31 11/08/2019 02:55 PM    CBC Latest Ref Rng & Units 06/14/2021 05/17/2021 05/15/2021  WBC 4.0 - 10.5 K/uL 6.8 9.3 9.4  Hemoglobin 13.0 - 17.0 g/dL 10.4(L)   9.5(L) 9.5(L)  Hematocrit 39.0 - 52.0 % 32.4(L) 29.9(L) 29.7(L)  Platelets 150 - 400 K/uL 247 302 268    No results found for: VD25OH  Clinical ASCVD: Yes  The ASCVD Risk score (Arnett DK, et al., 2019) failed to calculate for the following reasons:   The 2019 ASCVD risk score is only valid for ages 40 to 79    Other: CHADS2VASc = 5   Social History   Tobacco Use  Smoking Status Former   Packs/day: 1.00   Years: 57.00   Pack years: 57.00   Types: Cigarettes, Cigars   Quit date: 08/08/2015   Years since quitting: 6.0  Smokeless Tobacco Former   Types: Chew   Quit date: 11/04/1958  Tobacco Comments   occasional use sneaks around   BP  Readings from Last 3 Encounters:  08/07/21 114/68  07/20/21 120/66  07/13/21 122/64   Pulse Readings from Last 3 Encounters:  08/07/21 69  07/20/21 67  07/13/21 66   Wt Readings from Last 3 Encounters:  08/07/21 166 lb (75.3 kg)  07/20/21 165 lb 12.8 oz (75.2 kg)  07/13/21 164 lb (74.4 kg)    Assessment: Review of patient past medical history, allergies, medications, health status, including review of consultants reports, laboratory and other test data, was performed as part of comprehensive evaluation and provision of chronic care management services.   SDOH:  (Social Determinants of Health) assessments and interventions performed:      CCM Care Plan  Allergies  Allergen Reactions   Iodine Other (See Comments)    neck swells   Iohexol Swelling    Neck and gland swelling per patient.    Medications Reviewed Today     Reviewed by Eckard, Tammy, PharmD (Pharmacist) on 08/28/21 at 1045  Med List Status: <None>   Medication Order Taking? Sig Documenting Provider Last Dose Status Informant  acetaminophen (TYLENOL) 500 MG tablet [id number] Yes Take 1-2 tablets (500-1,000 mg total) by mouth every 6 (six) hours as needed. Zimmerman, Donielle M, PA-C Taking Active   albuterol (VENTOLIN HFA) 108 (90 Base) MCG/ACT inhaler [id number] Yes Inhale 2 puffs into the lungs every 6 (six) hours as needed for wheezing or shortness of breath. O'Sullivan, Melissa, NP Taking Active   amiodarone (PACERONE) 200 MG tablet [id number] Yes Take 1 tablet (200 mg total) by mouth 2 (two) times daily for 5 days, then take 1 tablet (200 mg) once daily thereafter  Patient taking differently: Take 200 mg by mouth daily.   Nahser, Philip J, MD Taking Active   aspirin EC 81 MG tablet [id number] Yes Take 1 tablet (81 mg total) by mouth in the morning. Swallow whole. Zimmerman, Donielle M, PA-C Taking Active   atorvastatin (LIPITOR) 80 MG tablet [id number] Yes Take 1 tablet (80 mg total) by mouth daily. Zhao, Xika, NP  Taking Active   fluticasone-salmeterol (ADVAIR HFA) 115-21 MCG/ACT inhaler [id number] No Inhale 2 puffs into the lungs 2 (two) times daily.  Patient not taking: Reported on 08/28/2021   O'Sullivan, Melissa, NP Not Taking Active   furosemide (LASIX) 20 MG tablet [id number] Yes Take 1 tablet (20 mg total) by mouth daily. Weaver, Scott T, PA-C Taking Active     Discontinued 05/08/21 1058 Multiple Vitamin (MULTIVITAMIN WITH MINERALS) TABS tablet [id number] Yes Take 1 tablet by mouth daily in the afternoon. [provider] Taking Active   omeprazole (PRILOSEC) 40 MG capsule [id number] Yes Take 1 capsule (40 mg total) by mouth daily. O'Sullivan, Melissa, NP Taking Active Self    potassium chloride (KLOR-CON) 10 MEQ tablet [id number] Yes Take 1 tablet (10 mEq total) by mouth daily. Richardson Dopp T, PA-C Taking Active   rivaroxaban (XARELTO) 20 MG TABS tablet [id number] Yes TAKE 1 TABLET BY MOUTH DAILY WITH SUPPER Nahser, Wonda Cheng, MD Taking Active   tamsulosin (FLOMAX) 0.4 MG CAPS capsule [id number] Yes TAKE 1 CAPSULE (0.4 MG TOTAL) BY MOUTH DAILY. [name], NP Taking Active   Tiotropium Bromide Monohydrate (SPIRIVA RESPIMAT) 2.5 MCG/ACT AERS [id number] Yes Inhale 2 puffs into the lungs daily. [provider] Taking Active             Patient Active Problem List   Diagnosis Date Noted   Persistent atrial fibrillation ([place]) 05/28/2021   Secondary hypercoagulable state ([place]) 05/28/2021   Malnutrition of moderate degree 05/15/2021   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   Coronary artery disease 05/08/2021   COVID-19 virus infection 04/23/2021   Unstable angina ([facility]) 04/20/2021   Erectile dysfunction 04/11/2021   Head trauma 03/07/2021   Left leg pain 04/07/2020   Benign prostatic hyperplasia with nocturia 03/08/2020   Seizures ([place]) 02/18/2020   PAF (paroxysmal atrial fibrillation) ([place]) 08/15/2017   Diabetes type 2, controlled ([place]) 07/31/2017   COPD GOLD II  04/03/2017   Primary  malignant neoplasm of bronchus of left lower lobe ([place]) 09/06/2015   Lung cancer ([place]) 11/07/2014   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   HTN (hypertension) 04/29/2014   Meningioma ([place]) 10/25/2013   Low back pain 10/25/2013   Osteoarthritis 08/18/2012   Atypical chest pain 08/14/2011   KERATOSIS 10/09/2010   SCIATICA, RIGHT 10/09/2010   UNSPECIFIED HEARING LOSS 05/21/2010   Hyperlipidemia 05/14/2010   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   RENAL CYST, RIGHT 02/05/2010   Abdominal aortic aneurysm 01/29/2010   LIPOMA 11/17/2009   MICROSCOPIC HEMATURIA 08/11/2008   GERD 07/26/2008   DERMOID CYST 07/25/2008    Immunization History  Administered Date(s) Administered   Fluad Quad(high Dose 65+) 07/23/2019, 09/04/2020, 07/13/2021   Influenza Split 08/02/2011, 08/18/2012   Influenza Whole 08/17/2008   Influenza, High Dose Seasonal PF 08/09/2015, 09/30/2016, 07/30/2017, 08/07/2018   Influenza,inj,Quad PF,6+ Mos 07/27/2013, 08/09/2014   PFIZER Comirnaty(Gray Top)Covid-19 Tri-Sucrose Vaccine 02/16/2021   PFIZER(Purple Top)SARS-COV-2 Vaccination 01/28/2020, 02/21/2020, 09/04/2020   Pfizer Covid-19 Vaccine Bivalent Booster 47yr & up 08/03/2021   Pneumococcal Conjugate-13 08/09/2014   Pneumococcal Polysaccharide-23 05/14/2010   Td 07/25/2008    Conditions to be addressed/monitored: Atrial Fibrillation, HTN, HLD, COPD and BPH; type 2 DM; GERD; seizure, aortic aneurysm.  Care Plan : General Pharmacy (Adult)  Updates made by ECherre Robins PHARMD since 08/28/2021 12:00 AM     Problem: Management of chronic medicaiton conditions and medication: Atrial Fibrillation, HTN, HLD, type 2 DM, Pulmonary Disease and BPH; seizures; h/o lung cancer and meningioma; GERD; aortic aneurysm   Priority: High  Onset Date: 02/08/2021  Note:   Current Barriers:  Does not adhere to prescribed medication regimen Management of chronic medical conditions and medication management Unable to afford medication  therapy  Pharmacist Clinical Goal(s):  Over the next 180 days, patient will achieve adherence to monitoring guidelines and medication adherence to achieve therapeutic efficacy contact provider office for questions/concerns as evidenced notation of same in electronic health record through collaboration with PharmD and provider.   Interventions: 1:1 collaboration with ODebbrah Alar NP regarding development and update of comprehensive plan of care as evidenced by provider attestation and co-signature Inter-disciplinary care team collaboration (see longitudinal plan of care) Comprehensive medication review performed; medication list updated  in electronic medical record  Diabetes: Controlled; A1c goal < 7.0% Last A1c was 5.8 % (07/13/2021) - has not taken metformin in over 3 months.  Current treatment:  none Current glucose readings: does not check BG at home Current meal patterns: eating a little better since CABG 05/2021. He has been out of work so eating out less. 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  Interventions:  (addressed at previous visit)  Educated on limiting high CHO foods; recommended increase non starchy vegetables Counseled on A1c goal.   Hypertension (BP goal <130/80) / CHF with preserved EF Controlled  Crrent treatment:  Furosemide 50m daily (lowered from 417m Potassium chloride 10 mEq daily (lowered from 20 mEq)  Previously took diltiazem but stopped during hospitalization when amiodarone was started; tried metoprolol - bradycardia Patient checks BP at home infrequently - no readings provided EF was 60 to 65% 04/2021 Denies hypotensive/hypertensive symptoms Interventions: (addressed at previous visit) Educated on BP goals Recommended continue current therpay   Hyperlipidemia / CAD (LDL < 70) Controlled Recent CABG x 3 vessels 05/08/2021. Patient continues to have issues with plural effusion s/p CABG.  Followed by cardio and  cardiothoracic surgery team.  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 Recommended try to increase intake of non starchy vegetables Recommended continue current therapy Consider recheck lipids with next labs Reviewed refill history and assess adherence - patient filled atorvastatin #90 on 04/26/21 and 08/07/2021 - will finish year at 94% adherence.   Chronic Obstructive Pulmonary Disease: Controlled Taking furosemide + KCL for plural effusion; monitored by cardiothoracic surgery Current treatment:  albuterol as rescue inhaler Advair HFA 115 mcg 2 puffs twice a day Spiriva 2.5mg 61mpimat - inhaler two puffs into lungs daily GOLD Classification: 2 (FEV1 50-79%) Most recent Pulmonary Function Testing: 05/19/2017 - spirometry FVC 2.84 (73%) FEV1 1.74 (62%) FEV1/FVC 61% Current COPD Classification:  A (low sx, <2 exacerbations/yr)  [place] not have patient assistance program but Advair dose (must spend $600 out of pocket on Rx medications to qualify.  08/1959/60/0459ient application for medication assistance for Advair was faxed 2 weeks ago. Called [employer] patient assistance program and was told he has not met OOP spend however we submitted reports that he has met the $600 OOP spend. Representative reviewed pharmacy report and agreed that he should have qualified. She is resubmitting application for 2nd review.  Will have decision in 48 to 72 hours Interventions:  Contacted [employer] patient assistance today. Patient has been approved to get Advair starting 08/22/2021 thru 11/03/2021. First fill of 90 days was shipped 08/27/2021. Anticipated delivery by 09/11/21 (7 to 10 business days).  Patient notified to expect delivery.   Continue to take furosemide and potassium as directed for plural effusion Continue current regimen for COPD  Atrial Fibrillation: NSR since last hospitalization but did recently change from diltiazem to amiodarone due to being in afib prior to cardiac cath Current rate/rhythm controller: amiodarone 200mg 52my Anticoagulant treatment: Xarelto 20mg d68m with evening meal Mailed patient PAP application after CCM visit in June - patient brought in application today [id number]  08/22/2021: Coordinated with patient assistance program to verify he was approved for Xarelto assistance through 11/03/2021. Patient has been mailed card to use  at pharmacy for free Xarelto. I was also faxed a copy and called [facility] to give the information to fill Xarelto now.  Interventions:  Recommended continue current regimen Patient to have LFTs and TSH monitored by cardiologist regularly due to amiodarone therapy  GERD:  Patient is currently controlled on the following medications:  Omeprazole 42m daily Patient has failed these meds in past: None noted  Triggers: laying down after eating Interventions: (addressed at previous visit)  Recommend continue current medications    Health Maintenance:  Reviewed vaccination history and discussed benefits of Shingrix and Tdap vaccines Has received both annual flu and COVID booster.  Patient to get the following vaccines in 2023 - Shingrix and Tdap vaccines  Patient Goals/Self-Care Activities Over the next 180 days, patient will:  take medications as prescribed and collaborate with provider on medication access solutions Follow up with cardiologist and cardiothoracic team Continue cardiac rehab program  Follow Up Plan: Telephone follow up appointment with care management team member scheduled for:  1 month                Medication Assistance:  Advair [place]  Enrollment ends  11/03/2021 (approval date 08/22/2021) Approved for Xarelto patient assistance thru 11/03/2021  [facility]:  [facility] [place]Phone: [phone]: [phone] [facility] [address] [place]Phone: [phone]: [phone]  Follow Up:  Patient agrees to Care Plan and Follow-up.  Plan: Telephone follow up appointment with care management team member scheduled for:  1 month  TCherre Robins PharmD Clinical Pharmacist [facility]

## 2021-08-28 NOTE — Patient Instructions (Signed)
Visit Information  PATIENT GOALS:  Goals Addressed             This Visit's Progress    Chronic Care Management Pharmacy Care Plan       CARE PLAN ENTRY (see longitudinal plan of care for additional care plan information)  Current Barriers:  Chronic Disease Management support, education, and care coordination needs related to Hypertension, Hyperlipidemia, Diabetes, COPD, AFib, GERD, BPH, Seizures   Hypertension BP Readings from Last 3 Encounters:  08/07/21 114/68  07/20/21 120/66  07/13/21 122/64  Pharmacist Clinical Goal(s): Over the next 90 days, patient will work with PharmD and providers to maintain BP goal <140/90 Current regimen:  Furosemide 20mg  daily (lowered from 40mg ) Potassium chloride 10 mEq daily (lowered from 20 mEq)  Interventions: Discussed blood pressure goal Patient self care activities - Over the next 90 days, patient will: Checked blood pressure at home 2 to 3 times per week and record for future visits Continue current medications for heart and blood pressure    Hyperlipidemia / Heart disease - recent bypass surgery Lab Results  Component Value Date/Time   LDLCALC 58 07/13/2021 01:36 PM   [place] 62 04/11/2021 02:21 PM   LDLDIRECT 142.2 12/26/2008 10:32 AM  Pharmacist Clinical Goal(s): Over the next 90 days, patient will work with PharmD and providers to maintain LDL goal < 100 Current regimen:  Atorvastatin 80mg  daily Interventions: Discussed LDL goal Counseled on avoiding high fat / cholesterol containing foods. Recommended try to increase intake of non starchy vegetables Patient self care activities - Over the next 90 days, patient will: Maintain cholesterol medication regimen.  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 Increase intake of non starchy vegetables Recommended continue current therapy  Diabetes Lab Results  Component Value Date/Time   HGBA1C 5.8 07/13/2021 01:36 PM   HGBA1C  6.5 04/11/2021 01:41 PM  Pharmacist Clinical Goal(s): Over the next 90 days, patient will work with PharmD and providers to maintain A1c goal <7% Current regimen:  Diet and exercise recommeded Interventions: Discussed A1c goal Patient self care activities - Over the next 90 days, patient will: Maintain A1c <7% Limit intake of sugar, potatoes, pasta, rice and bread.  Consider restarting metformin 500mg  twice a day if next A1c is 6.5% or higher  COPD Pharmacist Clinical Goal(s) Over the next 90 days, patient will work with PharmD and providers to reduce symptoms associated with COPD and reduce barriers to access of preferred medication regimens Current regimen:  Albuterol HFA 2 puffs every 6 hours as needed for wheezing Advair HFA 115 mcg Inhale 2 puffs twice a day Spiriva 2.5mg  respimat - inhaler two puffs into lungs daily (only using as needed)  Interventions: Discussed patient's preference for Advair over [employer] medication assistance for Advair / [employer] - patient approved to receive Advair 08/22/2021 thru 11/03/2021 - first shipment sent 08/27/2021 Patient self care activities - Over the next 90 days, patient will: Maintain medication regimen for COPD Discussed maintenance inhalers - Advair and Spiriva should be taken EVERY day Look for shipment of Advair - call clinical pharmacist if not received by 09/11/2021 Albuterol inhaler is to be used only if needed (rescue inhaler)   Atrial fibrillation Pharmacist Clinical Goal(s) Over the next 90 days, patient will work with PharmD and providers to reduce risk of stroke associated with Afib and reduce barriers to preferred medication regimen Current regimen:  Amiodarone 200mg  daily  Xarelto 20mg  daily Interventions: Called  patient assistance program - patient has been approved for Xarelto through 11/03/2021 and will receive card to use at pharmacy. Patient self care activities - Over the next 90 days, patient will: Maintain  current medication regimen for atrial fibrillation Continue to have liver and thyroid checked every 6 months while on amiodarone therapy   Medication management Pharmacist Clinical Goal(s): Over the next 90 days, patient will work with PharmD and providers to maintain optimal medication adherence Current pharmacy: [facility] Interventions Comprehensive medication review performed. Continue current medication management strategy Patient self care activities - Over the next 90 days, patient will: Focus on medication adherence by filling and taking medications appropriately  Take medications as prescribed Report any questions or concerns to PharmD and/or provider(s)  Health Maintenance:  Reviewed vaccination history and discussed benefits of tetanus and Shingrix vaccine Patient to get tetanus and Shingrix vaccine in 2023.   Patient Goals/Self-Care Activities Over the next 180 days, patient will:  Take medications as prescribed and collaborate with provider on medication access solutions Follow up with cardiologist and cardiothoracic teams Continue cardiac rehab program  Please see past updates related to this goal by clicking on the "Past Updates" button in the selected goal          The patient verbalized understanding of instructions, educational materials, and care plan provided today and declined offer to receive copy of patient instructions, educational materials, and care plan.   Telephone follow up appointment with care management team member scheduled for: 1 month  Cherre Robins, PharmD Clinical Pharmacist [facility]

## 2021-09-03 DIAGNOSIS — J449 Chronic obstructive pulmonary disease, unspecified: Secondary | ICD-10-CM

## 2021-09-03 DIAGNOSIS — E785 Hyperlipidemia, unspecified: Secondary | ICD-10-CM

## 2021-09-03 DIAGNOSIS — I4891 Unspecified atrial fibrillation: Secondary | ICD-10-CM | POA: Diagnosis not present

## 2021-09-03 DIAGNOSIS — E119 Type 2 diabetes mellitus without complications: Secondary | ICD-10-CM

## 2021-09-03 DIAGNOSIS — I2510 Atherosclerotic heart disease of native coronary artery without angina pectoris: Secondary | ICD-10-CM | POA: Diagnosis not present

## 2021-09-14 ENCOUNTER — Other Ambulatory Visit (HOSPITAL_BASED_OUTPATIENT_CLINIC_OR_DEPARTMENT_OTHER): Payer: Self-pay

## 2021-09-14 ENCOUNTER — Other Ambulatory Visit: Payer: Self-pay | Admitting: Family

## 2021-09-14 MED ORDER — TAMSULOSIN HCL 0.4 MG PO CAPS
ORAL_CAPSULE | Freq: Every day | ORAL | 1 refills | Status: DC
  Filled 2021-09-14: qty 90, 90d supply, fill #0
  Filled 2022-01-14: qty 90, 90d supply, fill #1

## 2021-09-17 ENCOUNTER — Other Ambulatory Visit (HOSPITAL_BASED_OUTPATIENT_CLINIC_OR_DEPARTMENT_OTHER): Payer: Self-pay

## 2021-09-18 ENCOUNTER — Ambulatory Visit: Payer: Medicare Other

## 2021-09-20 ENCOUNTER — Ambulatory Visit: Payer: Medicare Other

## 2021-09-20 NOTE — Progress Notes (Deleted)
Subjective:   Antron Seth is a 80 y.o. male who presents for Medicare Annual/Subsequent preventive examination.  Review of Systems    ***       Objective:    There were no vitals filed for this visit. There is no height or weight on file to calculate BMI.  Advanced Directives 06/20/2021 05/14/2021 05/04/2021 04/20/2021 04/02/2020 10/30/2019 09/30/2017  Does Patient Have a Medical Advance Directive? No No No No No No No  Would patient like information on creating a medical advance directive? No - Patient declined No - Patient declined No - Patient declined;Yes (MAU/Ambulatory/Procedural Areas - Information given) No - Patient declined - - No - Patient declined    Current Medications (verified) Outpatient Encounter Medications as of 09/20/2021  Medication Sig   acetaminophen (TYLENOL) 500 MG tablet Take 1-2 tablets (500-1,000 mg total) by mouth every 6 (six) hours as needed.   albuterol (VENTOLIN HFA) 108 (90 Base) MCG/ACT inhaler Inhale 2 puffs into the lungs every 6 (six) hours as needed for wheezing or shortness of breath.   amiodarone (PACERONE) 200 MG tablet Take 1 tablet (200 mg total) by mouth 2 (two) times daily for 5 days, then take 1 tablet (200 mg) once daily thereafter (Patient taking differently: Take 200 mg by mouth daily.)   aspirin EC 81 MG tablet Take 1 tablet (81 mg total) by mouth in the morning. Swallow whole.   atorvastatin (LIPITOR) 80 MG tablet Take 1 tablet (80 mg total) by mouth daily.   fluticasone-salmeterol (ADVAIR HFA) 115-21 MCG/ACT inhaler Inhale 2 puffs into the lungs 2 (two) times daily. (Patient not taking: Reported on 08/28/2021)   furosemide (LASIX) 20 MG tablet Take 1 tablet (20 mg total) by mouth daily.   Multiple Vitamin (MULTIVITAMIN WITH MINERALS) TABS tablet Take 1 tablet by mouth daily in the afternoon.   omeprazole (PRILOSEC) 40 MG capsule Take 1 capsule (40 mg total) by mouth daily.   potassium chloride (KLOR-CON) 10 MEQ tablet Take 1 tablet  (10 mEq total) by mouth daily.   rivaroxaban (XARELTO) 20 MG TABS tablet TAKE 1 TABLET BY MOUTH DAILY WITH SUPPER   tamsulosin (FLOMAX) 0.4 MG CAPS capsule TAKE 1 CAPSULE (0.4 MG TOTAL) BY MOUTH DAILY.   Tiotropium Bromide Monohydrate (SPIRIVA RESPIMAT) 2.5 MCG/ACT AERS Inhale 2 puffs into the lungs daily.   [DISCONTINUED] metFORMIN (GLUCOPHAGE) 500 MG tablet Take 1 tablet (500 mg total) by mouth 2 (two) times daily with a meal.   No facility-administered encounter medications on file as of 09/20/2021.    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   COPD (chronic obstructive pulmonary disease) (HCC)    Coronary artery disease    Diabetes type 2, controlled ([place]) 07/31/2017   Dyspnea    Dysrhythmia    a fib   GERD (gastroesophageal reflux disease)    Hematuria    refuses work up or referral - understands risks of morbidity / mortality - 11/2008, 12/2008   History of hiatal hernia    History of kidney stones    Hyperlipemia    Meningioma ([place]) 10/25/2013   Follows with Dr. [name].    Peripheral vascular disease ([place])    Abdominal Aortic Aneursym   Pneumonia    as a child  Radiation 09/18/15-10/25/15   left lower lobe 70.2 Gy   Seizures ([place]) 02/18/2020   Tobacco abuse    Past Surgical History:  Procedure Laterality Date   CHOLECYSTECTOMY N/A 07/23/2017   Procedure: LAPAROSCOPIC CHOLECYSTECTOMY;  Surgeon: Kinsinger, Arta Bruce, MD;  Location: [facility];  Service: General;  Laterality: N/A;   CLIPPING OF ATRIAL APPENDAGE Left 05/08/2021   Procedure: CLIPPING OF ATRIAL APPENDAGE USING 58 ATRICLIP;  Surgeon: Wonda Olds, MD;  Location: [place];  Service: Open Heart Surgery;  Laterality: Left;   COLONOSCOPY     CORONARY ARTERY BYPASS GRAFT N/A 05/08/2021   Procedure: CORONARY  ARTERY BYPASS GRAFTING (CABG)X 3 USING LEFT INTERNAL MAMMARY ARTERY AND RIGHT GREATER SAPEHNOUS VEIN;  Surgeon: Wonda Olds, MD;  Location: [place];  Service: Open Heart Surgery;  Laterality: N/A;   ENDOVEIN HARVEST OF GREATER SAPHENOUS VEIN Right 05/08/2021   Procedure: ENDOVEIN HARVEST OF GREATER SAPHENOUS VEIN;  Surgeon: Wonda Olds, MD;  Location: [place];  Service: Open Heart Surgery;  Laterality: Right;   EYE SURGERY Bilateral    Cataracts removed w/ lens implant   HERNIA REPAIR     Left 36 years ago . Right inguinal hernia repair 10-01-17 Dr. Kieth Brightly   INGUINAL HERNIA REPAIR Right 10/01/2017   Procedure: RIGHT INGUINAL HERNIA REPAIR WITH MESH;  Surgeon: Kinsinger, Arta Bruce, MD;  Location: [facility];  Service: General;  Laterality: Right;  TAP BLOCK   INSERTION OF MESH Right 10/01/2017   Procedure: INSERTION OF MESH;  Surgeon: Kieth Brightly Arta Bruce, MD;  Location: [facility];  Service: General;  Laterality: Right;   IR THORACENTESIS ASP PLEURAL SPACE W/IMG GUIDE  05/18/2021   IR THORACENTESIS ASP PLEURAL SPACE W/IMG GUIDE  06/07/2021   LEFT HEART CATH AND CORONARY ANGIOGRAPHY N/A 04/20/2021   Procedure: LEFT HEART CATH AND CORONARY ANGIOGRAPHY;  Surgeon: Wellington Hampshire, MD;  Location: [facility];  Service: Cardiovascular;  Laterality: N/A;   TEE WITHOUT CARDIOVERSION N/A 05/08/2021   Procedure: TRANSESOPHAGEAL ECHOCARDIOGRAM (TEE);  Surgeon: Wonda Olds, MD;  Location: [facility];  Service: Open Heart Surgery;  Laterality: N/A;   TONSILLECTOMY     TONSILLECTOMY     VIDEO BRONCHOSCOPY Bilateral 07/26/2015   Procedure: VIDEO BRONCHOSCOPY WITH FLUORO;  Surgeon: [name], MD;  [facility];  Service: Cardiopulmonary;  Laterality: Bilateral;   VIDEO BRONCHOSCOPY WITH ENDOBRONCHIAL NAVIGATION N/A 08/23/2015   Procedure: VIDEO BRONCHOSCOPY WITH ENDOBRONCHIAL NAVIGATION;  Surgeon: Grace Isaac, MD;  Location: [facility];  Service: Thoracic;  Laterality: N/A;   VIDEO  BRONCHOSCOPY WITH ENDOBRONCHIAL ULTRASOUND N/A 08/23/2015   Procedure: VIDEO BRONCHOSCOPY WITH ENDOBRONCHIAL ULTRASOUND;  Surgeon: Grace Isaac, MD;  Location: [facility];  Service: Thoracic;  Laterality: N/A;   Family History  Problem Relation Age of Onset   Leukemia Father    Emphysema Father    Learning disabilities Son    Leukemia Other    Stroke Other    Social History   Socioeconomic History   Marital status: Married    Spouse name: Not on file   Number of children: 2   Years of education: Not on file   Highest education level: Not on file  Occupational History   Occupation: Retired    Fish farm manager: [employer]    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[employer]  Tobacco Use   Smoking status: Former    Packs/day: 1.00    Years: 57.00    Pack years: 57.00  Types: Cigarettes, Cigars    Quit date: 08/08/2015    Years since quitting: 6.1   Smokeless tobacco: Former    Types: Chew    Quit date: 11/04/1958   Tobacco comments:    occasional use sneaks around  Vaping Use   Vaping Use: Former  Substance and Sexual Activity   Alcohol use: Not Currently    Alcohol/week: 0.0 standard drinks   Drug use: No   Sexual activity: Yes  Other Topics Concern   Not on file  Social History Narrative   Not on file   Social Determinants of Health   Financial Resource Strain: Medium Risk   Difficulty of Paying Living Expenses: Somewhat hard  Food Insecurity: Not on file  Transportation Needs: No Transportation Needs   Lack of Transportation (Medical): No   Lack of Transportation (Non-Medical): No  Physical Activity: Insufficiently Active   Days of Exercise per Week: 2 days   Minutes of Exercise per Session: 40 min  Stress: Not on file  Social Connections: Not on file    Tobacco Counseling Counseling given: Not Answered Tobacco comments: occasional use sneaks around   Clinical Intake:                 Diabetic?***         Activities of Daily Living In  your present state of health, do you have any difficulty performing the following activities: 05/14/2021 05/04/2021  Hearing? Tempie Donning  Vision? N N  Difficulty concentrating or making decisions? N N  Walking or climbing stairs? N N  Dressing or bathing? N N  Doing errands, shopping? N N  Some recent data might be hidden    Patient Care Team: [name], NP as PCP - General Nahser, Wonda Cheng, MD as PCP - Cardiology (Cardiology) Elsie Stain, MD as Attending Physician (Pulmonary Disease) Virgina Evener, OD (Optometry) Cherre Robins, RPH-CPP (Pharmacist) Sharmon Revere as Physician Assistant (Cardiology)  Indicate any recent Medical Services you may have received from other than [facility] providers in the past year (date may be approximate).     Assessment:   This is a routine wellness examination for Shannon.  Hearing/Vision screen No results found.  Dietary issues and exercise activities discussed:     Goals Addressed   None    Depression Screen PHQ 2/9 Scores 12/08/2020 01/28/2018 01/27/2017 10/14/2016 08/26/2016 12/14/2015 11/30/2015  PHQ - 2 Score 0 0 0 0 0 0 0  PHQ- 9 Score - - - - - - -    Fall Risk Fall Risk  07/13/2021 06/01/2020 01/28/2018 01/27/2017 10/14/2016  Falls in the past year? 0 0 No No Yes  Number falls in past yr: 0 0 - - 1  Injury with Fall? 0 0 - - No  Risk for fall due to : - - - - -    FALL RISK PREVENTION PERTAINING TO THE HOME:  Any stairs in or around the home? {YES/NO:21197} If so, are there any without handrails? {YES/NO:21197} Home free of loose throw rugs in walkways, pet beds, electrical cords, etc? {YES/NO:21197} Adequate lighting in your home to reduce risk of falls? {YES/NO:21197}  ASSISTIVE DEVICES UTILIZED TO PREVENT FALLS:  Life alert? {YES/NO:21197} Use of a cane, walker or w/c? {YES/NO:21197} Grab bars in the bathroom? {YES/NO:21197} Shower chair or bench in shower? {YES/NO:21197} Elevated toilet seat or a handicapped toilet?  {YES/NO:21197}  TIMED UP AND GO:  Was the test performed? {YES/NO:21197}.  Length of time to ambulate 10 feet: ***  sec.   {Appearance of VQXI:5038882}  Cognitive Function:        Immunizations Immunization History  Administered Date(s) Administered   Fluad Quad(high Dose 65+) 07/23/2019, 09/04/2020, 07/13/2021   Influenza Split 08/02/2011, 08/18/2012   Influenza Whole 08/17/2008   Influenza, High Dose Seasonal PF 08/09/2015, 09/30/2016, 07/30/2017, 08/07/2018   Influenza,inj,Quad PF,6+ Mos 07/27/2013, 08/09/2014   PFIZER Comirnaty(Gray Top)Covid-19 Tri-Sucrose Vaccine 02/16/2021   PFIZER(Purple Top)SARS-COV-2 Vaccination 01/28/2020, 02/21/2020, 09/04/2020   Pfizer Covid-19 Vaccine Bivalent Booster 54yrs & up 08/03/2021   Pneumococcal Conjugate-13 08/09/2014   Pneumococcal Polysaccharide-23 05/14/2010   Td 07/25/2008    {TDAP status:2101805}  {Flu Vaccine status:2101806}  {Pneumococcal vaccine status:2101807}  {Covid-19 vaccine status:2101808}  Qualifies for Shingles Vaccine? {YES/NO:21197}  Zostavax completed {YES/NO:21197}  {Shingrix Completed?:2101804}  Screening Tests Health Maintenance  Topic Date Due   OPHTHALMOLOGY EXAM  Never done   Zoster Vaccines- Shingrix (1 of 2) Never done   TETANUS/TDAP  07/25/2018   URINE MICROALBUMIN  06/01/2021   HEMOGLOBIN A1C  01/10/2022   FOOT EXAM  04/11/2022   Pneumonia Vaccine 62+ Years old  Completed   INFLUENZA VACCINE  Completed   COVID-19 Vaccine  Completed   HPV VACCINES  Aged Out    Health Maintenance  Health Maintenance Due  Topic Date Due   OPHTHALMOLOGY EXAM  Never done   Zoster Vaccines- Shingrix (1 of 2) Never done   TETANUS/TDAP  07/25/2018   URINE MICROALBUMIN  06/01/2021    {Colorectal cancer screening:2101809}  Lung Cancer Screening: (Low Dose CT Chest recommended if Age 92-80 years, 30 pack-year currently smoking OR have quit w/in 15years.) {DOES NOT does:27190::"does not"} qualify.   Lung  Cancer Screening Referral: ***  Additional Screening:  Hepatitis C Screening: {DOES NOT does:27190::"does not"} qualify; Completed ***  Vision Screening: Recommended annual ophthalmology exams for early detection of glaucoma and other disorders of the eye. Is the patient up to date with their annual eye exam?  {YES/NO:21197} Who is the provider or what is the name of the office in which the patient attends annual eye exams? *** If pt is not established with a provider, would they like to be referred to a provider to establish care? {YES/NO:21197}.   Dental Screening: Recommended annual dental exams for proper oral hygiene  Community Resource Referral / Chronic Care Management: CRR required this visit?  {YES/NO:21197}  CCM required this visit?  {YES/NO:21197}     Plan:     I have personally reviewed and noted the following in the patient's chart:   Medical and social history Use of alcohol, tobacco or illicit drugs  Current medications and supplements including opioid prescriptions. {Opioid Prescriptions:[phone]} Functional ability and status Nutritional status Physical activity Advanced directives List of other physicians Hospitalizations, surgeries, and ER visits in previous 12 months Vitals Screenings to include cognitive, depression, and falls Referrals and appointments  In addition, I have reviewed and discussed with patient certain preventive protocols, quality metrics, and best practice recommendations. A written personalized care plan for preventive services as well as general preventive health recommendations were provided to patient.     Marta Antu, LPN   80/01/4916   Nurse Notes: ***

## 2021-09-25 ENCOUNTER — Other Ambulatory Visit (HOSPITAL_BASED_OUTPATIENT_CLINIC_OR_DEPARTMENT_OTHER): Payer: Self-pay

## 2021-09-25 ENCOUNTER — Ambulatory Visit (INDEPENDENT_AMBULATORY_CARE_PROVIDER_SITE_OTHER): Payer: Medicare Other | Admitting: Pharmacist

## 2021-09-25 DIAGNOSIS — J449 Chronic obstructive pulmonary disease, unspecified: Secondary | ICD-10-CM

## 2021-09-25 DIAGNOSIS — I4891 Unspecified atrial fibrillation: Secondary | ICD-10-CM

## 2021-09-25 DIAGNOSIS — I2510 Atherosclerotic heart disease of native coronary artery without angina pectoris: Secondary | ICD-10-CM

## 2021-09-25 MED FILL — Amiodarone HCl Tab 200 MG: ORAL | 30 days supply | Qty: 30 | Fill #1 | Status: AC

## 2021-09-25 MED FILL — Rivaroxaban Tab 20 MG: ORAL | 30 days supply | Qty: 30 | Fill #5 | Status: AC

## 2021-09-25 NOTE — Patient Instructions (Addendum)
Visit Information  PATIENT GOALS:  Hypertension BP Readings from Last 3 Encounters:  08/07/21 114/68  07/20/21 120/66  07/13/21 122/64   Pharmacist Clinical Goal(s): Over the next 90 days, patient will work with PharmD and providers to maintain BP goal <140/90 Current regimen:  Furosemide 20mg  daily (lowered from 40mg ) Potassium chloride 10 mEq daily (lowered from 20 mEq)  Interventions: Discussed blood pressure goal Patient self care activities - Over the next 90 days, patient will: Checked blood pressure at home 2 to 3 times per week and record for future visits Continue current medications for heart and blood pressure    Hyperlipidemia / Heart disease - recent bypass surgery Lab Results  Component Value Date/Time   LDLCALC 58 07/13/2021 01:36 PM   [place] 62 04/11/2021 02:21 PM   LDLDIRECT 142.2 12/26/2008 10:32 AM   Pharmacist Clinical Goal(s): Over the next 90 days, patient will work with PharmD and providers to maintain LDL goal < 100 Current regimen:  Atorvastatin 80mg  daily Interventions: Discussed LDL goal Counseled on avoiding high fat / cholesterol containing foods. Recommended try to increase intake of non starchy vegetables Patient self care activities - Over the next 90 days, patient will: Maintain cholesterol medication regimen. Increase intake of non starchy vegetables Recommended continue current therapy  Diabetes Lab Results  Component Value Date/Time   HGBA1C 5.8 07/13/2021 01:36 PM   HGBA1C 6.5 04/11/2021 01:41 PM   Pharmacist Clinical Goal(s): Over the next 90 days, patient will work with PharmD and providers to maintain A1c goal <7% Current regimen:  Diet and exercise recommeded Interventions: Discussed A1c goal Patient self care activities - Over the next 90 days, patient will: Maintain A1c <7% Limit intake of sugar, potatoes, pasta, rice and bread.  Consider restarting metformin 500mg  twice a day if next A1c is 6.5% or  higher  COPD Pharmacist Clinical Goal(s) Over the next 90 days, patient will work with PharmD and providers to reduce symptoms associated with COPD and reduce barriers to access of preferred medication regimens Current regimen:  Albuterol HFA 2 puffs every 6 hours as needed for wheezing Advair HFA 115 mcg Inhale 2 puffs twice a day Spiriva 2.5mg  respimat - inhaler two puffs into lungs daily (only using as needed)  Interventions: Confirmed patient received 3 months supply of Advair 08/27/2021 Patient self care activities - Over the next 90 days, patient will: Maintain medication regimen for COPD Discussed maintenance inhalers - Advair and Spiriva should be taken EVERY day Albuterol inhaler is to be used only if needed (rescue inhaler)   Atrial fibrillation Pharmacist Clinical Goal(s) Over the next 90 days, patient will work with PharmD and providers to reduce risk of stroke associated with Afib and reduce barriers to preferred medication regimen Current regimen:  Amiodarone 200mg  daily  Xarelto 20mg  daily Interventions: Assisted with requesting refills for Amiodarone and Xarelto (Xarelto to be filled on medication assistance program card - confirmed cost was $0) Patient self care activities - Over the next 90 days, patient will: Maintain current medication regimen for atrial fibrillation Continue to have liver and thyroid checked every 6 months while on amiodarone therapy   Medication management Pharmacist Clinical Goal(s): Over the next 90 days, patient will work with PharmD and providers to maintain optimal medication adherence Current pharmacy: [facility] Comprehensive medication review performed. Continue current medication management strategy Patient self care activities - Over the next 90 days, patient will: Focus on medication adherence by filling and taking medications appropriately  Take medications as  prescribed Report any questions or concerns  to PharmD and/or provider(s)  Health Maintenance:  Reviewed vaccination history and discussed benefits of tetanus and Shingrix vaccine Patient to get tetanus and Shingrix vaccine in 2023.   Patient Goals/Self-Care Activities Over the next 180 days, patient will:  Take medications as prescribed and collaborate with provider on medication access solutions Follow up with cardiologist and cardiothoracic teams Continue cardiac rehab program  The patient verbalized understanding of instructions, educational materials, and care plan provided today and declined offer to receive copy of patient instructions, educational materials, and care plan.   Telephone follow up appointment with care management team member scheduled for: 2 months  Will see PCP in December 2022.   Cherre Robins, PharmD Clinical Pharmacist [facility]

## 2021-09-25 NOTE — Chronic Care Management (AMB) (Signed)
Chronic Care Management Pharmacy Note  09/25/2021 Name:  Robert Bates MRN:  [id number] DOB:  18-Dec-1940  Summary;  Verified patient received shipment of 3 months of Advair from medication assistance program Assisted patient in requesting refills for amiodarone and xarleto. Spoke with pharmacy to ensure cost of Xarelto was $0 with medication assistance program card.   Subjective: Robert Bates is an 80 y.o. year old male who is a primary patient of [name], NP.  The CCM team was consulted for assistance with disease management and care coordination needs.    Collaboration with patient and pharmacy  for  follow up  in response to provider referral for pharmacy case management and/or care coordination services.   Consent to Services:  The patient was given information about Chronic Care Management services, agreed to services, and gave verbal consent prior to initiation of services.  Please see initial visit note for detailed documentation.   Patient Care Team: [name], NP as PCP - General Nahser, Wonda Cheng, MD as PCP - Cardiology (Cardiology) Elsie Stain, MD as Attending Physician (Pulmonary Disease) Virgina Evener, OD (Optometry) Cherre Robins, RPH-CPP (Pharmacist) Sharmon Revere as Physician Assistant (Cardiology)  Recent office visits: 07/13/2021 - PCP Inda Castle, NP) F/U chronic conditions. Removed furosemide from med list (patient preferance) and tramadols (completed course) 04/11/2021 - PCP Inda Castle, NP) F/U DM and HTN. Restarted Advair HFA 115/21 2 puffs twice a day; Started sildenafil 20mg  as needed for ED.   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  Continued Xarelto for Afib; Plan to stop aspirin 81mg  3 months from CABG (October 2022 - but pt to check with surgeon). Added furosemide 20mg  daily and potassium 10 mEq daily. Recheck BMP in 2 weeks.  07/11/2021 - Cardiothoracic surgery Harriet Pho,  [facility]) F/u CABG with complecation of plural effusion and left thoracenteisi 05/18/2021 and 06/07/2021. Notes suggest restarting furosemide for LEE but not added back to med list.  06/28/2021 - Cardiothoracic Surgery Harriet Pho, [facility]) F/U CABG with complication of plural effusion. Xray orderd. Patient sent for thoracentesis. Recommended continue furosemide 40mg  daily and potassium 20 mEq daily. 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 Sondra Come. Currently on observation. Repeat CT scan of the chest performed recently showed no concerning findings for disease recurrence or metastasis.  The pleural effusion is secondary to his recent cardiac surgery. Recommended cont observation with repeat chest CT in 1 year 06/11/2021 - Cardio ([place], [facility]) Left plural effusion re-accumulation per xray. Recommended continue furosemide 40mg  daily and potassium 20 mEq daily. Recheck in 2 weeks.  06/05/2021 - Cardio [place], [facility]) Hospital F/U S/P CABG. Continue Aspirin until 3 months post CABG, Stopped metoprolol due to lightheadedness and low HR. Started furosemide 40mg  daily for 7 days and as needed thereafter for weight gain > 3 lbs and potassium 20 mEq daily for 7 days and as needed when taking furosemide.  BMET checked and will be repeated in 1 week.  06/04/2021 - Cardiothoracic surgery Harriet Pho, [facility]) F/U CABG and left plural effusion. Need thoracentesis on 05/18/2021 while in hospital. Unable to give diuretic due to low BP. Ordered US guided thoracentesis with repeat xray in 1 week.  05/28/2021 - Cardio ([place], [facility]) Afib Clinic. Noted to be bradycardic. Recommended lower dose of metoprolol to 12.5mg   bid. No other med changes.  05/24/2021 - Cardiothoracic surgery (Dr Orvan Seen) F/U CABG. No med  changes.  04/18/2021 - Cardio Glenford Peers, NP) Video Visit to discuss abnormal CCTA from 04/16/2021. Showed flow limiting lesions in the mLAD, mLCX and mid diag vessel. Ordered cardiac cath. since allergy to contact dye - premedication planned wiht prednisone for Q13, Q7 and A1h before cath and benadryl 25mg  1 our before cath. Instructed to hold Xarelto prior to cath. Removed levetiracetam from med list - patinet not taking.    Hospital visits: 05/08/2021 to 07/18/[facility]22[facility] Admission at [facility] - Admitted for CABG x 3 performed on 05/08/2021. Post Op blood loss anemia with transfusion on 07/6. Medications Started: cephalexin 500mg  every 8 hours  Dulera 200/4mcg - 2 puffs twice a day  Metoprolol tartrate 25mg  twice a day  Potassium chloride 10 mEq daily for 4 days  Torsemide 20mg  take 1 tablet daily for 4 days Medications Stopped: Advair HFA 115/21 mcg and NTG SL Medication Changes: amiodarone 200mg  bid for 5 days, then once a day thereafter  04/20/2021 to 04/26/2021 [facility] Admission Cath @ [place] on 04/20/2021. Showe significant disease. He was to have bypass surgery but was found to be COVID positive. He was treated with Paxlovid. He was kept in hospital for 5 days toremain on heparin drip since he has Afib. Will return to hospital for CABG 05/08/2021. New Medication: Atorvastatin 80mg ; amiodarone 200mg  bid for 7 days, then 200mg  daily thereafter; metfromin 500mg  bid Medication stopped: pravastatin 80mg ; diltiazem Medication changes: none  Objective:  Lab Results  Component Value Date   CREATININE 0.96 08/03/2021   CREATININE 0.86 06/14/2021   CREATININE 0.86 06/05/2021    Lab Results  Component Value Date   HGBA1C 5.8 07/13/2021   Last diabetic Eye exam: No results found for: HMDIABEYEEXA  Last diabetic Foot exam: No results found for: HMDIABFOOTEX      Component Value Date/Time   CHOL 140 07/13/2021 1336   TRIG 72.0 07/13/2021 1336   HDL 67.00 07/13/2021 1336    CHOLHDL 2 07/13/2021 1336   VLDL 14.4 07/13/2021 1336   LDLCALC 58 07/13/2021 1336   LDLCALC 62 04/11/2021 1421   LDLDIRECT 142.2 12/26/2008 1032    Hepatic Function Latest Ref Rng & Units 06/14/2021 05/04/2021 12/08/2020  Total Protein 6.5 - 8.1 g/dL 5.9(L) 5.9(L) 6.1  Albumin 3.5 - 5.0 g/dL 2.8(L) 3.3(L) -  AST 15 - 41 U/L 11(L) 16 13  ALT 0 - 44 U/L 13 13 12   Alk Phosphatase 38 - 126 U/L 103 80 -  Total Bilirubin 0.3 - 1.2 mg/dL 0.4 0.5 0.4  Bilirubin, Direct 0.0 - 0.3 mg/dL - - -    Lab Results  Component Value Date/Time   TSH 4.26 08/03/2021 08:25 AM   TSH 1.31 11/08/2019 02:55 PM    CBC Latest Ref Rng & Units 06/14/2021 05/17/2021 05/15/2021  WBC 4.0 - 10.5 K/uL 6.8 9.3 9.4  Hemoglobin 13.0 - 17.0 g/dL 10.4(L) 9.5(L) 9.5(L)  Hematocrit 39.0 - 52.0 % 32.4(L) 29.9(L) 29.7(L)  Platelets 150 - 400 K/uL 247 302 268    No results found for: VD25OH  Clinical ASCVD: Yes  The ASCVD Risk score (Arnett DK, et al., 2019) failed to calculate for the following reasons:   The 2019 ASCVD risk score is only valid for ages 37 to 63    Other: CHADS2VASc = 5   Social History   Tobacco Use  Smoking Status Former   Packs/day: 1.00  Years: 57.00   Pack years: 57.00   Types: Cigarettes, Cigars   Quit date: 08/08/2015   Years since quitting: 6.1  Smokeless Tobacco Former   Types: Chew   Quit date: 11/04/1958  Tobacco Comments   occasional use sneaks around   BP Readings from Last 3 Encounters:  08/07/21 114/68  07/20/21 120/66  07/13/21 122/64   Pulse Readings from Last 3 Encounters:  08/07/21 69  07/20/21 67  07/13/21 66   Wt Readings from Last 3 Encounters:  08/07/21 166 lb (75.3 kg)  07/20/21 165 lb 12.8 oz (75.2 kg)  07/13/21 164 lb (74.4 kg)    Assessment: Review of patient past medical history, allergies, medications, health status, including review of consultants reports, laboratory and other test data, was performed as part of comprehensive evaluation and provision of  chronic care management services.   SDOH:  (Social Determinants of Health) assessments and interventions performed:  SDOH Interventions    Flowsheet Row Most Recent Value  SDOH Interventions   Food Insecurity Interventions Intervention Not Indicated         CCM Care Plan  Allergies  Allergen Reactions   Iodine Other (See Comments)    neck swells   Iohexol Swelling    Neck and gland swelling per patient.    Medications Reviewed Today     Reviewed by Cherre Robins, RPH-CPP (Pharmacist) on 09/25/21 at 1341  Med List Status: <None>   Medication Order Taking? Sig Documenting Provider Last Dose Status Informant  acetaminophen (TYLENOL) 500 MG tablet [id number] Yes Take 1-2 tablets (500-1,000 mg total) by mouth every 6 (six) hours as needed. Lars Pinks M, PA-C Taking Active   albuterol (VENTOLIN HFA) 108 (90 Base) MCG/ACT inhaler [id number] Yes Inhale 2 puffs into the lungs every 6 (six) hours as needed for wheezing or shortness of breath. [name], NP Taking Active   amiodarone (PACERONE) 200 MG tablet [id number] Yes Take 1 tablet (200 mg total) by mouth 2 (two) times daily for 5 days, then take 1 tablet (200 mg) once daily thereafter  Patient taking differently: Take 200 mg by mouth daily.   Nahser, Wonda Cheng, MD Taking Active   aspirin EC 81 MG tablet [id number] Yes Take 1 tablet (81 mg total) by mouth in the morning. Swallow whole. Nani Skillern, PA-C Taking Active   atorvastatin (LIPITOR) 80 MG tablet [id number] Yes Take 1 tablet (80 mg total) by mouth daily. Margie Billet, NP Taking Active   fluticasone-salmeterol (ADVAIR HFA) 742-59 MCG/ACT inhaler [id number] Yes Inhale 2 puffs into the lungs 2 (two) times daily. [name], NP Taking Active   furosemide (LASIX) 20 MG tablet [id number] Yes Take 1 tablet (20 mg total) by mouth daily. Richardson Dopp T, [place] Taking Active     Discontinued 05/08/21 1058 Multiple Vitamin (MULTIVITAMIN WITH MINERALS) TABS  tablet [id number] Yes Take 1 tablet by mouth daily in the afternoon. [provider] Taking Active   omeprazole (PRILOSEC) 40 MG capsule [id number] Yes Take 1 capsule (40 mg total) by mouth daily. [name], NP Taking Active Self  potassium chloride (KLOR-CON) 10 MEQ tablet [id number] Yes Take 1 tablet (10 mEq total) by mouth daily. Richardson Dopp T, PA-C Taking Active   rivaroxaban (XARELTO) 20 MG TABS tablet [id number] Yes TAKE 1 TABLET BY MOUTH DAILY WITH SUPPER Nahser, Wonda Cheng, MD Taking Active   tamsulosin (FLOMAX) 0.4 MG CAPS capsule [id number] Yes TAKE 1 CAPSULE (0.4 MG TOTAL) BY MOUTH DAILY. [name], NP Taking Active  Tiotropium Bromide Monohydrate (SPIRIVA RESPIMAT) 2.5 MCG/ACT AERS [id number] Yes Inhale 2 puffs into the lungs daily. [provider] Taking Active             Patient Active Problem List   Diagnosis Date Noted   Persistent atrial fibrillation ([place]) 05/28/2021   Secondary hypercoagulable state ([place]) 05/28/2021   Malnutrition of moderate degree 05/15/2021   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   Coronary artery disease 05/08/2021   COVID-19 virus infection 04/23/2021   Unstable angina (HCC) 04/20/2021   Erectile dysfunction 04/11/2021   Head trauma 03/07/2021   Left leg pain 04/07/2020   Benign prostatic hyperplasia with nocturia 03/08/2020   Seizures ([place]) 02/18/2020   PAF (paroxysmal atrial fibrillation) ([place]) 08/15/2017   Diabetes type 2, controlled ([place]) 07/31/2017   COPD GOLD II  04/03/2017   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[place]) 09/06/2015   Lung cancer ([place]) 11/07/2014   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   HTN (hypertension) 04/29/2014   Meningioma ([place]) 10/25/2013   Low back pain 10/25/2013   Osteoarthritis 08/18/2012   Atypical chest pain 08/14/2011   KERATOSIS 10/09/2010   SCIATICA, RIGHT 10/09/2010   UNSPECIFIED HEARING LOSS 05/21/2010   Hyperlipidemia 05/14/2010   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    RENAL CYST, RIGHT 02/05/2010   Abdominal aortic aneurysm 01/29/2010   LIPOMA 11/17/2009   MICROSCOPIC HEMATURIA 08/11/2008   GERD 07/26/2008   DERMOID CYST 07/25/2008    Immunization History  Administered Date(s) Administered   Fluad Quad(high Dose 65+) 07/23/2019, 09/04/2020, 07/13/2021   Influenza Split 08/02/2011, 08/18/2012   Influenza Whole 08/17/2008   Influenza, High Dose Seasonal PF 08/09/2015, 09/30/2016, 07/30/2017, 08/07/2018   Influenza,inj,Quad PF,6+ Mos 07/27/2013, 08/09/2014   PFIZER Comirnaty(Gray Top)Covid-19 Tri-Sucrose Vaccine 02/16/2021   PFIZER(Purple Top)SARS-COV-2 Vaccination 01/28/2020, 02/21/2020, 09/04/2020   Pfizer Covid-19 Vaccine Bivalent Booster 5yrs & up 08/03/2021   Pneumococcal Conjugate-13 08/09/2014   Pneumococcal Polysaccharide-23 05/14/2010   Td 07/25/2008    Conditions to be addressed/monitored: Atrial Fibrillation, HTN, HLD, COPD and BPH; type 2 DM; GERD; seizure, aortic aneurysm.  Care Plan : General Pharmacy (Adult)  Updates made by Cherre Robins, RPH-CPP since 09/25/2021 12:00 AM     Problem: Management of chronic medicaiton conditions and medication: Atrial Fibrillation, HTN, HLD, type 2 DM, Pulmonary Disease and BPH; seizures; h/o lung cancer and meningioma; GERD; aortic aneurysm   Priority: High  Onset Date: 02/08/2021  Note:   Current Barriers:  Does not adhere to prescribed medication regimen Management of chronic medical conditions and medication management Unable to afford medication therapy - improved  Pharmacist Clinical Goal(s):  Over the next 180 days, patient will achieve adherence to monitoring guidelines and medication adherence to achieve therapeutic efficacy contact provider office for questions/concerns as evidenced notation of same in electronic health record through collaboration with PharmD and provider.   Interventions: 1:1 collaboration with [name], NP regarding development and update of  comprehensive plan of care as evidenced by provider attestation and co-signature Inter-disciplinary care team collaboration (see longitudinal plan of care) Comprehensive medication review performed; medication list updated in electronic medical record  Diabetes: Controlled; A1c goal < 7.0% Last A1c was 5.8 % (07/13/2021) - has not taken metformin in over 3 months.  Current treatment:  none Current glucose readings: does not check BG at home Current meal patterns: eating a little better since CABG 05/2021. He has been out of work so eating out less. 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  Interventions:  (addressed at previous visit)  Educated on limiting high CHO foods; recommended increase non starchy vegetables Counseled on A1c goal.   Hypertension (BP goal <130/80) / CHF with preserved EF Controlled  Crrent treatment:  Furosemide 20mg  daily (lowered from 40mg ) Potassium chloride 10 mEq daily (lowered from 20 mEq)  Previously took diltiazem but stopped during hospitalization when amiodarone was started; tried metoprolol - bradycardia Patient checks BP at home infrequently - no readings provided EF was 60 to 65% 04/2021 Denies hypotensive/hypertensive symptoms Interventions: (addressed at previous visit) Educated on BP goals Recommended continue current therpay   Hyperlipidemia / CAD (LDL < 70) Controlled Recent CABG x 3 vessels 05/08/2021. Patient continues to have issues with plural effusion s/p CABG.  Followed by cardio and cardiothoracic surgery team.  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 Recommended try to increase intake of non starchy vegetables Recommended continue  current therapy Consider recheck lipids with next labs Reviewed refill history and assess adherence - patient filled atorvastatin #90 on 04/26/21 and 08/07/2021 - will finish year at 94% adherence.   Chronic Obstructive Pulmonary Disease: Controlled Taking furosemide + KCL for plural effusion; monitored by cardiothoracic surgery Current treatment:  albuterol as rescue inhaler Advair HFA 115 mcg 2 puffs twice a day Spiriva 2.5mg  respimat - inhaler two puffs into lungs daily GOLD Classification: 2 (FEV1 50-79%) Most recent Pulmonary Function Testing: 05/19/2017 - spirometry FVC 2.84 (73%) FEV1 1.74 (62%) FEV1/FVC 61% Current COPD Classification:  A (low sx, <2 exacerbations/yr)  [place] does not have patient assistance program but Advair dose (must spend $600 out of pocket on Rx medications to qualify.  50/35/4656: Patient application for medication assistance for Advair was faxed 2 weeks ago. Called [employer] patient assistance program and was told he has not met OOP spend however we submitted reports that he has met the $600 OOP spend. Representative reviewed pharmacy report and agreed that he should have qualified. She is resubmitting application for 2nd review.  Will have decision in 48 to 72 hours 08/28/2021: [place] patient assistance today. Patient has been approved to get Advair starting 08/22/2021 thru 11/03/2021. First fill of 90 days was shipped 08/27/2021. Anticipated delivery by 09/11/21 (7 to 10 business days).  Patient notified to expect delivery. Interventions:   Verified with patient that he received 3 months of Advair inhalers on 08/27/2021. Has enough thru 2022 until Medicare benefits restart.  Continue to take furosemide and potassium as directed for plural effusion Continue current regimen for COPD  Atrial Fibrillation: NSR since last hospitalization but did recently change from diltiazem to amiodarone due to being in afib prior to cardiac cath Current rate/rhythm controller:  amiodarone 200mg  daily Anticoagulant treatment: Xarelto 20mg  daily with evening meal Mailed patient PAP application after CCM visit in June - patient brought in application today [id number] score: 5  08/22/2021: Coordinated with patient assistance program to verify he was approved for Xarelto assistance through 11/03/2021. Patient has been mailed card to use at pharmacy for free Xarelto. I was also faxed a copy and called [facility] pharmacy to give the information to fill Xarelto now.  Interventions:  Recommended continue current regimen Patient to have LFTs and TSH monitored by cardiologist regularly due to amiodarone therapy Assisted patient in requesting  refill for Xarelto - was able to get 30 days on patient assistance program free card.  Also assisted patient in requesting refill for amiodarone $RemoveBefore'200mg'[id number]$    GERD:  Patient is currently controlled on the following medications:  Omeprazole $RemoveBef'40mg'[id number]$  daily Patient has failed these meds in past: None noted  Triggers: laying down after eating Interventions: (addressed at previous visit)  Recommend continue current medications    Health Maintenance:  Reviewed vaccination history and discussed benefits of Shingrix and Tdap vaccines Has received both annual flu and COVID booster.  Patient to get the following vaccines in 2023 - Shingrix and Tdap vaccines  Patient Goals/Self-Care Activities Over the next 180 days, patient will:  take medications as prescribed and collaborate with provider on medication access solutions Follow up with cardiologist and cardiothoracic team Continue cardiac rehab program  Follow Up Plan: Telephone follow up appointment with care management team member scheduled for:  2 months    Patient to see PCP in December 2022.              Medication Assistance:  Advair will be obtained through [place] medication assistance program.  Enrollment ends 11/03/2021 (approval date 08/22/2021) Approved for Xarelto patient  assistance thru 11/03/2021  Patient's preferred pharmacy is:  [facility] [address], [place] [address] Phone: [phone] Fax: [place] [address] [address] Phone: [phone] Fax: [phone]   Follow Up:  Patient agrees to Care Plan and Follow-up.  Plan: Telephone follow up appointment with care management team member scheduled for:  2 months . Has appointment with PCP in December 2022.   Cherre Robins, PharmD Clinical Pharmacist [facility]

## 2021-09-26 ENCOUNTER — Other Ambulatory Visit (HOSPITAL_BASED_OUTPATIENT_CLINIC_OR_DEPARTMENT_OTHER): Payer: Self-pay

## 2021-10-01 ENCOUNTER — Ambulatory Visit (INDEPENDENT_AMBULATORY_CARE_PROVIDER_SITE_OTHER): Payer: Medicare Other

## 2021-10-01 VITALS — Ht 69.0 in | Wt 166.0 lb

## 2021-10-01 DIAGNOSIS — Z0000 Encounter for general adult medical examination without abnormal findings: Secondary | ICD-10-CM | POA: Diagnosis not present

## 2021-10-01 NOTE — Progress Notes (Signed)
Subjective:   Robert Bates is a 80 y.o. male who presents for an Initial Medicare Annual Wellness Visit.   I connected with [name] today by telephone and verified that I am speaking with the correct person using two identifiers. Location patient: home Location provider: work Persons participating in the virtual visit: patient, Marine scientist.    I discussed the limitations, risks, security and privacy concerns of performing an evaluation and management service by telephone and the availability of in person appointments. I also discussed with the patient that there may be a patient responsible charge related to this service. The patient expressed understanding and verbally consented to this telephonic visit.    Interactive audio and video telecommunications were attempted between this provider and patient, however failed, due to patient having technical difficulties OR patient did not have access to video capability.  We continued and completed visit with audio only.  Some vital signs may be absent or patient reported.   Time Spent with patient on telephone encounter: 30 minutes  Review of Systems     Cardiac Risk Factors include: advanced age (>59men, >81 women);diabetes mellitus;hypertension;male gender;dyslipidemia     Objective:    Today's Vitals   10/01/21 1101  Weight: 166 lb (75.3 kg)  Height: 5\' 9"  (1.753 m)   Body mass index is 24.51 kg/m.  Advanced Directives 10/01/2021 06/20/2021 05/14/2021 05/04/2021 04/20/2021 04/02/2020 10/30/2019  Does Patient Have a Medical Advance Directive? No No No No No No No  Would patient like information on creating a medical advance directive? No - Patient declined No - Patient declined No - Patient declined No - Patient declined;Yes (MAU/Ambulatory/Procedural Areas - Information given) No - Patient declined - -    Current Medications (verified) Outpatient Encounter Medications as of 10/01/2021  Medication Sig   acetaminophen (TYLENOL) 500 MG  tablet Take 1-2 tablets (500-1,000 mg total) by mouth every 6 (six) hours as needed.   albuterol (VENTOLIN HFA) 108 (90 Base) MCG/ACT inhaler Inhale 2 puffs into the lungs every 6 (six) hours as needed for wheezing or shortness of breath.   amiodarone (PACERONE) 200 MG tablet Take 1 tablet (200 mg total) by mouth 2 (two) times daily for 5 days, then take 1 tablet (200 mg) once daily thereafter (Patient taking differently: Take 200 mg by mouth daily.)   aspirin EC 81 MG tablet Take 1 tablet (81 mg total) by mouth in the morning. Swallow whole.   atorvastatin (LIPITOR) 80 MG tablet Take 1 tablet (80 mg total) by mouth daily.   fluticasone-salmeterol (ADVAIR HFA) 115-21 MCG/ACT inhaler Inhale 2 puffs into the lungs 2 (two) times daily.   furosemide (LASIX) 20 MG tablet Take 1 tablet (20 mg total) by mouth daily.   Multiple Vitamin (MULTIVITAMIN WITH MINERALS) TABS tablet Take 1 tablet by mouth daily in the afternoon.   omeprazole (PRILOSEC) 40 MG capsule Take 1 capsule (40 mg total) by mouth daily.   potassium chloride (KLOR-CON) 10 MEQ tablet Take 1 tablet (10 mEq total) by mouth daily.   rivaroxaban (XARELTO) 20 MG TABS tablet TAKE 1 TABLET BY MOUTH DAILY WITH SUPPER   tamsulosin (FLOMAX) 0.4 MG CAPS capsule TAKE 1 CAPSULE (0.4 MG TOTAL) BY MOUTH DAILY.   Tiotropium Bromide Monohydrate (SPIRIVA RESPIMAT) 2.5 MCG/ACT AERS Inhale 2 puffs into the lungs daily.   [DISCONTINUED] metFORMIN (GLUCOPHAGE) 500 MG tablet Take 1 tablet (500 mg total) by mouth 2 (two) times daily with a meal.   No facility-administered encounter medications on file as of  10/01/2021.    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   COPD (chronic obstructive pulmonary disease) (HCC)    Coronary artery disease    Diabetes type  2, controlled ([place]) 07/31/2017   Dyspnea    Dysrhythmia    a fib   GERD (gastroesophageal reflux disease)    Hematuria    refuses work up or referral - understands risks of morbidity / mortality - 11/2008, 12/2008   History of hiatal hernia    History of kidney stones    Hyperlipemia    Meningioma ([place]) 10/25/2013   Follows with Dr. [name].    Peripheral vascular disease ([place])    Abdominal Aortic Aneursym   Pneumonia    as a child   Radiation 09/18/15-10/25/15   left lower lobe 70.2 Gy   Seizures ([place]) 02/18/2020   Tobacco abuse    Past Surgical History:  Procedure Laterality Date   CHOLECYSTECTOMY N/A 07/23/2017   Procedure: LAPAROSCOPIC CHOLECYSTECTOMY;  Surgeon: Mickeal Skinner, MD;  Location: [facility];  Service: General;  Laterality: N/A;   CLIPPING OF ATRIAL APPENDAGE Left 05/08/2021   Procedure: CLIPPING OF ATRIAL APPENDAGE USING 60 ATRICLIP;  Surgeon: Wonda Olds, MD;  Location: [place];  Service: Open Heart Surgery;  Laterality: Left;   COLONOSCOPY     CORONARY ARTERY BYPASS GRAFT N/A 05/08/2021   Procedure: CORONARY ARTERY BYPASS GRAFTING (CABG)X 3 USING LEFT INTERNAL MAMMARY ARTERY AND RIGHT GREATER SAPEHNOUS VEIN;  Surgeon: Wonda Olds, MD;  Location: [place];  Service: Open Heart Surgery;  Laterality: N/A;   ENDOVEIN HARVEST OF GREATER SAPHENOUS VEIN Right 05/08/2021   Procedure: ENDOVEIN HARVEST OF GREATER SAPHENOUS VEIN;  Surgeon: Wonda Olds, MD;  Location: [place];  Service: Open Heart Surgery;  Laterality: Right;   EYE SURGERY Bilateral    Cataracts removed w/ lens implant   HERNIA REPAIR     Left 36 years ago . Right inguinal hernia repair 10-01-17 Dr. Kieth Brightly   INGUINAL HERNIA REPAIR Right 10/01/2017   Procedure: RIGHT INGUINAL HERNIA REPAIR WITH MESH;  Surgeon: Kinsinger, Arta Bruce, MD;  Location: [facility];  Service: General;  Laterality: Right;  TAP BLOCK   INSERTION OF MESH Right 10/01/2017   Procedure: INSERTION OF MESH;  Surgeon: Kieth Brightly  Arta Bruce, MD;  Location: [facility];  Service: General;  Laterality: Right;   IR THORACENTESIS ASP PLEURAL SPACE W/IMG GUIDE  05/18/2021   IR THORACENTESIS ASP PLEURAL SPACE W/IMG GUIDE  06/07/2021   LEFT HEART CATH AND CORONARY ANGIOGRAPHY N/A 04/20/2021   Procedure: LEFT HEART CATH AND CORONARY ANGIOGRAPHY;  Surgeon: Wellington Hampshire, MD;  Location: [place] [facility];  Service: Cardiovascular;  Laterality: N/A;   TEE WITHOUT CARDIOVERSION N/A 05/08/2021   Procedure: TRANSESOPHAGEAL ECHOCARDIOGRAM (TEE);  Surgeon: Wonda Olds, MD;  Location: [place];  Service: Open Heart Surgery;  Laterality: N/A;   TONSILLECTOMY     TONSILLECTOMY     VIDEO BRONCHOSCOPY Bilateral 07/26/2015   Procedure: VIDEO BRONCHOSCOPY WITH FLUORO;  Surgeon: [name], MD;  [facility];  Service: Cardiopulmonary;  Laterality: Bilateral;   VIDEO BRONCHOSCOPY WITH ENDOBRONCHIAL NAVIGATION N/A 08/23/2015   Procedure: VIDEO BRONCHOSCOPY WITH ENDOBRONCHIAL NAVIGATION;  Surgeon: Grace Isaac, MD;  Location: [place];  Service: Thoracic;  Laterality: N/A;   VIDEO BRONCHOSCOPY WITH ENDOBRONCHIAL ULTRASOUND N/A 08/23/2015   Procedure: VIDEO BRONCHOSCOPY WITH ENDOBRONCHIAL ULTRASOUND;  Surgeon: Grace Isaac, MD;  Location[facility];  Service: Thoracic;  Laterality: N/A;   Family History  Problem Relation Age of Onset   Leukemia Father    Emphysema Father    Learning disabilities Son    Leukemia Other    Stroke Other    Social History   Socioeconomic History   Marital status: Married    Spouse name: Not on file   Number of children: 2   Years of education: Not on file   Highest education level: Not on file  Occupational History   Occupation: Retired    Fish farm manager: [employer]    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[employer]  Tobacco Use   Smoking status: Former    Packs/day: 1.00    Years: 57.00    Pack years: 57.00    Types: Cigarettes, Cigars    Quit date: 08/08/2015    Years since quitting:  6.1   Smokeless tobacco: Former    Types: Chew    Quit date: 11/04/1958   Tobacco comments:    occasional use sneaks around  Vaping Use   Vaping Use: Former  Substance and Sexual Activity   Alcohol use: Not Currently    Alcohol/week: 0.0 standard drinks   Drug use: No   Sexual activity: Yes  Other Topics Concern   Not on file  Social History Narrative   Not on file   Social Determinants of Health   Financial Resource Strain: Medium Risk   Difficulty of Paying Living Expenses: Somewhat hard  Food Insecurity: No Food Insecurity   Worried About Charity fundraiser in the Last Year: Never true   [place] in the Last Year: Never true  Transportation Needs: No Transportation Needs   Lack of Transportation (Medical): No   Lack of Transportation (Non-Medical): No  Physical Activity: Insufficiently Active   Days of Exercise per Week: 2 days   Minutes of Exercise per Session: 40 min  Stress: No Stress Concern Present   Feeling of Stress : Not at all  Social Connections: Socially Isolated   Frequency of Communication with Friends and Family: Once a week   Frequency of Social Gatherings with Friends and Family: Once a week   Attends Religious Services: Never   Marine scientist or Organizations: No   Attends Music therapist: Never   Marital Status: Married    Tobacco Counseling Counseling given: Not Answered Tobacco comments: occasional use sneaks around   Clinical Intake:  Pre-visit preparation completed: Yes  Pain : No/denies pain     BMI - recorded: 24.51 Nutritional Status: BMI of 19-24  Normal Nutritional Risks: None Diabetes: Yes CBG done?: No Did pt. bring in CBG monitor from home?: No  How often do you need to have someone help you when you read instructions, pamphlets, or other written materials from your doctor or pharmacy?: 1 - Never  Diabetes:  Is the patient diabetic?  Yes  If diabetic, was a CBG obtained today?  No  Did the  patient bring in their glucometer from home?  No phone visit How often do you monitor your CBG's? never.   Financial Strains and Diabetes Management:  Are you having any financial strains with the device, your supplies or your medication? No .  Does the patient want to be seen by  Chronic Care Management for management of their diabetes?  No  Would the patient like to be referred to a Nutritionist or for Diabetic Management?  No   Diabetic Exams:  Diabetic Eye Exam: Completed 06/2021-per patient.   Diabetic Foot Exam: Completed 04/11/2021.    Interpreter Needed?: No  Information entered by :: Caroleen Hamman LPN   Activities of Daily Living In your present state of health, do you have any difficulty performing the following activities: 10/01/2021 05/14/2021  Hearing? N Y  Vision? N N  Difficulty concentrating or making decisions? N N  Walking or climbing stairs? N N  Dressing or bathing? N N  Doing errands, shopping? N N  Preparing Food and eating ? N -  Using the Toilet? N -  In the past six months, have you accidently leaked urine? N -  Do you have problems with loss of bowel control? N -  Managing your Medications? N -  Managing your Finances? N -  Housekeeping or managing your Housekeeping? N -  Some recent data might be hidden    Patient Care Team: [name], NP as PCP - General Nahser, Wonda Cheng, MD as PCP - Cardiology (Cardiology) Elsie Stain, MD as Attending Physician (Pulmonary Disease) Virgina Evener, OD (Optometry) Cherre Robins, RPH-CPP (Pharmacist) Sharmon Revere as Physician Assistant (Cardiology)  Indicate any recent Medical Services you may have received from other than [facility] providers in the past year (date may be approximate).     Assessment:   This is a routine wellness examination for [place].  Hearing/Vision screen Hearing Screening - Comments:: C/o mild hearing loss Vision Screening - Comments:: Last eye exam-06/2021  Dietary  issues and exercise activities discussed: Current Exercise Habits: The patient does not participate in regular exercise at present, Exercise limited by: None identified   Goals Addressed             This Visit's Progress    Patient Stated       Would like to work more than one day per week       Depression Screen PHQ 2/9 Scores 10/01/2021 12/08/2020 01/28/2018 01/27/2017 10/14/2016 08/26/2016 12/14/2015  PHQ - 2 Score 0 0 0 0 0 0 0  PHQ- 9 Score - - - - - - -    Fall Risk Fall Risk  10/01/2021 07/13/2021 06/01/2020 01/28/2018 01/27/2017  Falls in the past year? 0 0 0 No No  Number falls in past yr: 0 0 0 - -  Injury with Fall? 0 0 0 - -  Risk for fall due to : - - - - -  Follow up Falls prevention discussed - - - -    FALL RISK PREVENTION PERTAINING TO THE HOME:  Any stairs in or around the home? No  Home free of loose throw rugs in walkways, pet beds, electrical cords, etc? Yes  Adequate lighting in your home to reduce risk of falls? Yes   ASSISTIVE DEVICES UTILIZED TO PREVENT FALLS:  Life alert? No  Use of a cane, walker or w/c? No  Grab bars in the bathroom? Yes  Shower chair or bench in shower? No  Elevated toilet seat or a handicapped toilet? No   TIMED UP AND GO:  Was the test performed? No . Phone visit   Cognitive Function:Normal cognitive status assessed by this Nurse Health Advisor. No abnormalities found.          Immunizations Immunization History  Administered Date(s) Administered   Fluad Quad(high  Dose 65+) 07/23/2019, 09/04/2020, 07/13/2021   Influenza Split 08/02/2011, 08/18/2012   Influenza Whole 08/17/2008   Influenza, High Dose Seasonal PF 08/09/2015, 09/30/2016, 07/30/2017, 08/07/2018   Influenza,inj,Quad PF,6+ Mos 07/27/2013, 08/09/2014   PFIZER Comirnaty(Gray Top)Covid-19 Tri-Sucrose Vaccine 02/16/2021   PFIZER(Purple Top)SARS-COV-2 Vaccination 01/28/2020, 02/21/2020, 09/04/2020   Pfizer Covid-19 Vaccine Bivalent Booster 47yrs & up  08/03/2021   Pneumococcal Conjugate-13 08/09/2014   Pneumococcal Polysaccharide-23 05/14/2010   Td 07/25/2008    TDAP status: Due, Education has been provided regarding the importance of this vaccine. Advised may receive this vaccine at local pharmacy or Health Dept. Aware to provide a copy of the vaccination record if obtained from local pharmacy or Health Dept. Verbalized acceptance and understanding.  Flu Vaccine status: Up to date  Pneumococcal vaccine status: Up to date  Covid-19 vaccine status: Completed vaccines  Qualifies for Shingles Vaccine? Yes   Zostavax completed No   Shingrix Completed?: No.    Education has been provided regarding the importance of this vaccine. Patient has been advised to call insurance company to determine out of pocket expense if they have not yet received this vaccine. Advised may also receive vaccine at local pharmacy or Health Dept. Verbalized acceptance and understanding.  Screening Tests Health Maintenance  Topic Date Due   OPHTHALMOLOGY EXAM  Never done   Zoster Vaccines- Shingrix (1 of 2) Never done   TETANUS/TDAP  07/25/2018   URINE MICROALBUMIN  06/01/2021   HEMOGLOBIN A1C  01/10/2022   FOOT EXAM  04/11/2022   Pneumonia Vaccine 20+ Years old  Completed   INFLUENZA VACCINE  Completed   COVID-19 Vaccine  Completed   HPV VACCINES  Aged Out    Health Maintenance  Health Maintenance Due  Topic Date Due   OPHTHALMOLOGY EXAM  Never done   Zoster Vaccines- Shingrix (1 of 2) Never done   TETANUS/TDAP  07/25/2018   URINE MICROALBUMIN  06/01/2021    Colorectal cancer screening: No longer required.   Lung Cancer Screening: (Low Dose CT Chest recommended if Age 25-80 years, 30 pack-year currently smoking OR have quit w/in 15years.) does not qualify.   Patient had a Chest CT on 06/14/2021   Additional Screening:  Hepatitis C Screening: does not qualify  Vision Screening: Recommended annual ophthalmology exams for early detection of  glaucoma and other disorders of the eye. Is the patient up to date with their annual eye exam?  Yes  Who is the provider or what is the name of the office in which the patient attends annual eye exams? Pt unsure of name  Dental Screening: Recommended annual dental exams for proper oral hygiene  Community Resource Referral / Chronic Care Management: CRR required this visit?  No   CCM required this visit?  No      Plan:     I have personally reviewed and noted the following in the patient's chart:   Medical and social history Use of alcohol, tobacco or illicit drugs  Current medications and supplements including opioid prescriptions. Patient is not currently taking opioid prescriptions. Functional ability and status Nutritional status Physical activity Advanced directives List of other physicians Hospitalizations, surgeries, and ER visits in previous 12 months Vitals Screenings to include cognitive, depression, and falls Referrals and appointments  In addition, I have reviewed and discussed with patient certain preventive protocols, quality metrics, and best practice recommendations. A written personalized care plan for preventive services as well as general preventive health recommendations were provided to patient.   Due to this  being a telephonic visit, the after visit summary with patients personalized plan was offered to patient via mail or my-chart. Patient would like to access on my-chart.   Marta Antu, LPN   [id number]  Nurse Health Advisor  Nurse Notes: None

## 2021-10-01 NOTE — Patient Instructions (Signed)
[name] , Thank you for taking time to complete your Medicare Wellness Visit. I appreciate your ongoing commitment to your health goals. Please review the following plan we discussed and let me know if I can assist you in the future.   Screening recommendations/referrals: Colonoscopy: No longer required Recommended yearly ophthalmology/optometry visit for glaucoma screening and checkup Recommended yearly dental visit for hygiene and checkup  Vaccinations: Influenza vaccine: up to date Pneumococcal vaccine: Up to date Tdap vaccine: Discuss with pharmacy Shingles vaccine: Discuss with pharmacy   Covid-19: Up to date  Advanced directives: You may pick up packet in the office on Wednesday 10/03/21.  Conditions/risks identified: See problem list  Next appointment: Follow up in one year for your annual wellness visit.   Preventive Care 6 Years and Older, Male Preventive care refers to lifestyle choices and visits with your health care provider that can promote health and wellness. What does preventive care include? A yearly physical exam. This is also called an annual well check. Dental exams once or twice a year. Routine eye exams. Ask your health care provider how often you should have your eyes checked. Personal lifestyle choices, including: Daily care of your teeth and gums. Regular physical activity. Eating a healthy diet. Avoiding tobacco and drug use. Limiting alcohol use. Practicing safe sex. Taking low doses of aspirin every day. Taking vitamin and mineral supplements as recommended by your health care provider. What happens during an annual well check? The services and screenings done by your health care provider during your annual well check will depend on your age, overall health, lifestyle risk factors, and family history of disease. Counseling  Your health care provider may ask you questions about your: Alcohol use. Tobacco use. Drug use. Emotional  well-being. Home and relationship well-being. Sexual activity. Eating habits. History of falls. Memory and ability to understand (cognition). Work and work Statistician. Screening  You may have the following tests or measurements: Height, weight, and BMI. Blood pressure. Lipid and cholesterol levels. These may be checked every 5 years, or more frequently if you are over 13 years old. Skin check. Lung cancer screening. You may have this screening every year starting at age 74 if you have a 30-pack-year history of smoking and currently smoke or have quit within the past 15 years. Fecal occult blood test (FOBT) of the stool. You may have this test every year starting at age 10. Flexible sigmoidoscopy or colonoscopy. You may have a sigmoidoscopy every 5 years or a colonoscopy every 10 years starting at age 6. Prostate cancer screening. Recommendations will vary depending on your family history and other risks. Hepatitis C blood test. Hepatitis B blood test. Sexually transmitted disease (STD) testing. Diabetes screening. This is done by checking your blood sugar (glucose) after you have not eaten for a while (fasting). You may have this done every 1-3 years. 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AAA) screening. You may need this if you are a current or former smoker. Osteoporosis. You may be screened starting at age 33 if you are at high risk. Talk with your health care provider about your test results, treatment options, and if necessary, the need for more tests. Vaccines  Your health care provider may recommend certain vaccines, such as: Influenza vaccine. This is recommended every year. Tetanus, diphtheria, and acellular pertussis (Tdap, Td) vaccine. You may need a Td booster every 10 years. Zoster vaccine. You may need this after age 48. Pneumococcal 13-valent conjugate (PCV13) vaccine. One dose is recommended after age 78.  Pneumococcal polysaccharide (PPSV23) vaccine. One dose is recommended after  age 98. Talk to your health care provider about which screenings and vaccines you need and how often you need them. This information is not intended to replace advice given to you by your health care provider. Make sure you discuss any questions you have with your health care provider. Document Released: 11/17/2015 Document Revised: 07/10/2016 Document Reviewed: 08/22/2015 Elsevier Interactive Patient Education  2017 [employer] Prevention in the Home Falls can cause injuries. They can happen to people of all ages. There are many things you can do to make your home safe and to help prevent falls. What can I do on the outside of my home? Regularly fix the edges of walkways and driveways and fix any cracks. Remove anything that might make you trip as you walk through a door, such as a raised step or threshold. Trim any bushes or trees on the path to your home. Use bright outdoor lighting. Clear any walking paths of anything that might make someone trip, such as rocks or tools. Regularly check to see if handrails are loose or broken. Make sure that both sides of any steps have handrails. Any raised decks and porches should have guardrails on the edges. Have any leaves, snow, or ice cleared regularly. Use sand or salt on walking paths during winter. Clean up any spills in your garage right away. This includes oil or grease spills. What can I do in the bathroom? Use night lights. Install grab bars by the toilet and in the tub and shower. Do not use towel bars as grab bars. Use non-skid mats or decals in the tub or shower. If you need to sit down in the shower, use a plastic, non-slip stool. Keep the floor dry. Clean up any water that spills on the floor as soon as it happens. Remove soap buildup in the tub or shower regularly. Attach bath mats securely with double-sided non-slip rug tape. Do not have throw rugs and other things on the floor that can make you trip. What can I do in the  bedroom? Use night lights. Make sure that you have a light by your bed that is easy to reach. Do not use any sheets or blankets that are too big for your bed. They should not hang down onto the floor. Have a firm chair that has side arms. You can use this for support while you get dressed. Do not have throw rugs and other things on the floor that can make you trip. What can I do in the kitchen? Clean up any spills right away. Avoid walking on wet floors. Keep items that you use a lot in easy-to-reach places. If you need to reach something above you, use a strong step stool that has a grab bar. Keep electrical cords out of the way. Do not use floor polish or wax that makes floors slippery. If you must use wax, use non-skid floor wax. Do not have throw rugs and other things on the floor that can make you trip. What can I do with my stairs? Do not leave any items on the stairs. Make sure that there are handrails on both sides of the stairs and use them. Fix handrails that are broken or loose. Make sure that handrails are as long as the stairways. Check any carpeting to make sure that it is firmly attached to the stairs. Fix any carpet that is loose or worn. Avoid having throw rugs at the  top or bottom of the stairs. If you do have throw rugs, attach them to the floor with carpet tape. Make sure that you have a light switch at the top of the stairs and the bottom of the stairs. If you do not have them, ask someone to add them for you. What else can I do to help prevent falls? Wear shoes that: Do not have high heels. Have rubber bottoms. Are comfortable and fit you well. Are closed at the toe. Do not wear sandals. If you use a stepladder: Make sure that it is fully opened. Do not climb a closed stepladder. Make sure that both sides of the stepladder are locked into place. Ask someone to hold it for you, if possible. Clearly mark and make sure that you can see: Any grab bars or  handrails. First and last steps. Where the edge of each step is. Use tools that help you move around (mobility aids) if they are needed. These include: Canes. Walkers. Scooters. Crutches. Turn on the lights when you go into a dark area. Replace any light bulbs as soon as they burn out. Set up your furniture so you have a clear path. Avoid moving your furniture around. If any of your floors are uneven, fix them. If there are any pets around you, be aware of where they are. Review your medicines with your doctor. Some medicines can make you feel dizzy. This can increase your chance of falling. Ask your doctor what other things that you can do to help prevent falls. This information is not intended to replace advice given to you by your health care provider. Make sure you discuss any questions you have with your health care provider. Document Released: 08/17/2009 Document Revised: 03/28/2016 Document Reviewed: 11/25/2014 Elsevier Interactive Patient Education  2017 [employer].

## 2021-10-03 ENCOUNTER — Ambulatory Visit (INDEPENDENT_AMBULATORY_CARE_PROVIDER_SITE_OTHER): Payer: Medicare Other | Admitting: Family

## 2021-10-03 ENCOUNTER — Encounter: Payer: Self-pay | Admitting: Family

## 2021-10-03 ENCOUNTER — Ambulatory Visit (HOSPITAL_BASED_OUTPATIENT_CLINIC_OR_DEPARTMENT_OTHER)
Admission: RE | Admit: 2021-10-03 | Discharge: 2021-10-03 | Disposition: A | Discharge status: Home / Self Care | Payer: Medicare Other | Source: Ambulatory Visit | Attending: Family | Admitting: Family

## 2021-10-03 ENCOUNTER — Other Ambulatory Visit (HOSPITAL_BASED_OUTPATIENT_CLINIC_OR_DEPARTMENT_OTHER): Payer: Self-pay

## 2021-10-03 ENCOUNTER — Other Ambulatory Visit: Payer: Self-pay

## 2021-10-03 VITALS — BP 123/73 | HR 66 | Temp 97.9°F | Resp 16 | Ht 67.0 in | Wt 169.0 lb

## 2021-10-03 DIAGNOSIS — J441 Chronic obstructive pulmonary disease with (acute) exacerbation: Secondary | ICD-10-CM | POA: Insufficient documentation

## 2021-10-03 DIAGNOSIS — I2510 Atherosclerotic heart disease of native coronary artery without angina pectoris: Secondary | ICD-10-CM | POA: Diagnosis not present

## 2021-10-03 DIAGNOSIS — I4891 Unspecified atrial fibrillation: Secondary | ICD-10-CM | POA: Diagnosis not present

## 2021-10-03 DIAGNOSIS — J90 Pleural effusion, not elsewhere classified: Secondary | ICD-10-CM | POA: Diagnosis not present

## 2021-10-03 DIAGNOSIS — R0789 Other chest pain: Secondary | ICD-10-CM | POA: Diagnosis not present

## 2021-10-03 DIAGNOSIS — R0602 Shortness of breath: Secondary | ICD-10-CM | POA: Diagnosis not present

## 2021-10-03 DIAGNOSIS — R079 Chest pain, unspecified: Secondary | ICD-10-CM | POA: Diagnosis not present

## 2021-10-03 DIAGNOSIS — J449 Chronic obstructive pulmonary disease, unspecified: Secondary | ICD-10-CM

## 2021-10-03 MED ORDER — PREDNISONE 10 MG PO TABS
ORAL_TABLET | ORAL | 0 refills | Status: DC
  Filled 2021-10-03: qty 20, 8d supply, fill #0

## 2021-10-03 MED ORDER — ALBUTEROL SULFATE (2.5 MG/3ML) 0.083% IN NEBU
2.5000 mg | INHALATION_SOLUTION | Freq: Once | RESPIRATORY_TRACT | Status: AC
  Administered 2021-10-03: 2.5 mg via RESPIRATORY_TRACT

## 2021-10-03 MED ORDER — METHYLPREDNISOLONE SODIUM SUCC 125 MG IJ SOLR
125.0000 mg | Freq: Once | INTRAMUSCULAR | Status: AC
  Administered 2021-10-03: 125 mg via INTRAMUSCULAR

## 2021-10-03 NOTE — Patient Instructions (Addendum)
Please begin prednisone taper for flare up of your COPD. Please also continue advair twice daily and albuterol every 4-6 hours as needed for wheezing/SOB.  Go to the ER if your develop severe/worsening chest pain or shortness of breath.

## 2021-10-03 NOTE — Progress Notes (Signed)
Subjective:     Patient ID: Robert Bates, male    DOB: 1941-03-09, 80 y.o.   MRN: [id number]  Chief Complaint  Patient presents with   Chest Pain    Here for chest pain   Shortness of Breath    Here for shortness of breath 2 weeks    Chest Pain  Associated symptoms include shortness of breath.  Shortness of Breath Associated symptoms include chest pain.  Patient is in today for to discuss shortness of breath. He does have a hx of CAD and underwent CABG on 05/08/21. Notes that he has some associated substernal chest pain which is worse with bending forward.  Chest pain is not worse with exertion.   SOB started a few weeks ago. Notes quite a bit of wheezing. Not much improvement with albuterol neb. Breathing feels tight.   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   COPD (chronic obstructive pulmonary disease) (HCC)    Coronary artery disease    Diabetes type 2, controlled ([place]) 07/31/2017   Dyspnea    Dysrhythmia    a fib   GERD (gastroesophageal reflux disease)    Hematuria    refuses work up or referral - understands risks of morbidity / mortality - 11/2008, 12/2008   History of hiatal hernia    History of kidney stones    Hyperlipemia    Meningioma ([place]) 10/25/2013   Follows with Dr. [name].    Peripheral vascular disease ([place])    Abdominal Aortic Aneursym   Pneumonia    as a child   Radiation 09/18/15-10/25/15   left lower lobe 70.2 Gy   Seizures ([place]) 02/18/2020   Tobacco abuse     Past Surgical History:  Procedure Laterality Date   CHOLECYSTECTOMY N/A 07/23/2017   Procedure: LAPAROSCOPIC CHOLECYSTECTOMY;  Surgeon:  Mickeal Skinner, MD;  Location: [facility];  Service: General;  Laterality: N/A;   CLIPPING OF ATRIAL APPENDAGE Left 05/08/2021   Procedure: CLIPPING OF ATRIAL APPENDAGE USING 40 ATRICLIP;  Surgeon: Wonda Olds, MD;  Location: [facility];  Service: Open Heart Surgery;  Laterality: Left;   COLONOSCOPY     CORONARY ARTERY BYPASS GRAFT N/A 05/08/2021   Procedure: CORONARY ARTERY BYPASS GRAFTING (CABG)X 3 USING LEFT INTERNAL MAMMARY ARTERY AND RIGHT GREATER SAPEHNOUS VEIN;  Surgeon: Wonda Olds, MD;  Location: [facility];  Service: Open Heart Surgery;  Laterality: N/A;   ENDOVEIN HARVEST OF GREATER SAPHENOUS VEIN Right 05/08/2021   Procedure: ENDOVEIN HARVEST OF GREATER SAPHENOUS VEIN;  Surgeon: Wonda Olds, MD;  Location: [facility];  Service: Open Heart Surgery;  Laterality: Right;   EYE SURGERY Bilateral    Cataracts removed w/ lens implant   HERNIA REPAIR     Left 36 years ago . Right inguinal hernia repair 10-01-17 Dr. Kieth Brightly   INGUINAL HERNIA REPAIR Right 10/01/2017   Procedure: RIGHT INGUINAL HERNIA REPAIR WITH MESH;  Surgeon: Kinsinger, Arta Bruce, MD;  Location: [facility];  Service: General;  Laterality: Right;  TAP BLOCK   INSERTION OF MESH Right 10/01/2017   Procedure: INSERTION OF  MESH;  Surgeon: Kinsinger, Arta Bruce, MD;  Location: [facility];  Service: General;  Laterality: Right;   IR THORACENTESIS ASP PLEURAL SPACE W/IMG GUIDE  05/18/2021   IR THORACENTESIS ASP PLEURAL SPACE W/IMG GUIDE  06/07/2021   LEFT HEART CATH AND CORONARY ANGIOGRAPHY N/A 04/20/2021   Procedure: LEFT HEART CATH AND CORONARY ANGIOGRAPHY;  Surgeon: Wellington Hampshire, MD;  Location: [facility];  Service: Cardiovascular;  Laterality: N/A;   TEE WITHOUT CARDIOVERSION N/A 05/08/2021   Procedure: TRANSESOPHAGEAL ECHOCARDIOGRAM (TEE);  Surgeon: Wonda Olds, MD;  Location: [facility];  Service: Open Heart Surgery;  Laterality: N/A;   TONSILLECTOMY     TONSILLECTOMY     VIDEO BRONCHOSCOPY Bilateral 07/26/2015    Procedure: VIDEO BRONCHOSCOPY WITH FLUORO;  Surgeon: [name], MD;  [facility];  Service: Cardiopulmonary;  Laterality: Bilateral;   VIDEO BRONCHOSCOPY WITH ENDOBRONCHIAL NAVIGATION N/A 08/23/2015   Procedure: VIDEO BRONCHOSCOPY WITH ENDOBRONCHIAL NAVIGATION;  Surgeon: Grace Isaac, MD;  Location[facility];  Service: Thoracic;  Laterality: N/A;   VIDEO BRONCHOSCOPY WITH ENDOBRONCHIAL ULTRASOUND N/A 08/23/2015   Procedure: VIDEO BRONCHOSCOPY WITH ENDOBRONCHIAL ULTRASOUND;  Surgeon: Grace Isaac, MD;  Location: [facility];  Service: Thoracic;  Laterality: N/A;    Family History  Problem Relation Age of Onset   Leukemia Father    Emphysema Father    Learning disabilities Son    Leukemia Other    Stroke Other     Social History   Socioeconomic History   Marital status: Married    Spouse name: Not on file   Number of children: 2   Years of education: Not on file   Highest education level: Not on file  Occupational History   Occupation: Retired    Fish farm manager: [employer]    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[employer]  Tobacco Use   Smoking status: Former    Packs/day: 1.00    Years: 57.00    Pack years: 57.00    Types: Cigarettes, Cigars    Quit date: 08/08/2015    Years since quitting: 6.1   Smokeless tobacco: Former    Types: Chew    Quit date: 11/04/1958   Tobacco comments:    occasional use sneaks around  Vaping Use   Vaping Use: Former  Substance and Sexual Activity   Alcohol use: Not Currently    Alcohol/week: 0.0 standard drinks   Drug use: No   Sexual activity: Not Currently  Other Topics Concern   Not on file  Social History Narrative   Not on file   Social Determinants of Health   Financial Resource Strain: Medium Risk   Difficulty of Paying Living Expenses: Somewhat hard  Food Insecurity: No Food Insecurity   Worried About Charity fundraiser in the Last Year: Never true   [place] in the Last Year: Never true   Transportation Needs: No Transportation Needs   Lack of Transportation (Medical): No   Lack of Transportation (Non-Medical): No  Physical Activity: Insufficiently Active   Days of Exercise per Week: 2 days   Minutes of Exercise per Session: 40 min  Stress: No Stress Concern Present   Feeling of Stress : Not at all  Social Connections: Socially Isolated   Frequency of Communication with Friends and Family: Once a week   Frequency of Social Gatherings with Friends and Family: Once a week   Attends Religious Services: Never   Marine scientist or Organizations:  No   Attends Archivist Meetings: Never   Marital Status: Married  Human resources officer Violence: Not At Risk   Fear of Current or Ex-Partner: No   Emotionally Abused: No   Physically Abused: No   Sexually Abused: No    Outpatient Medications Prior to Visit  Medication Sig Dispense Refill   acetaminophen (TYLENOL) 500 MG tablet Take 1-2 tablets (500-1,000 mg total) by mouth every 6 (six) hours as needed. 30 tablet 0   albuterol (VENTOLIN HFA) 108 (90 Base) MCG/ACT inhaler Inhale 2 puffs into the lungs every 6 (six) hours as needed for wheezing or shortness of breath. 8.5 g 5   amiodarone (PACERONE) 200 MG tablet Take 1 tablet (200 mg total) by mouth 2 (two) times daily for 5 days, then take 1 tablet (200 mg) once daily thereafter (Patient taking differently: Take 200 mg by mouth daily.) 35 tablet 1   aspirin EC 81 MG tablet Take 1 tablet (81 mg total) by mouth in the morning. Swallow whole.     atorvastatin (LIPITOR) 80 MG tablet Take 1 tablet (80 mg total) by mouth daily. 90 tablet 1   fluticasone-salmeterol (ADVAIR HFA) 115-21 MCG/ACT inhaler Inhale 2 puffs into the lungs 2 (two) times daily. 24 g 4   furosemide (LASIX) 20 MG tablet Take 1 tablet (20 mg total) by mouth daily. 90 tablet 3   Multiple Vitamin (MULTIVITAMIN WITH MINERALS) TABS tablet Take 1 tablet by mouth daily in the afternoon.     omeprazole  (PRILOSEC) 40 MG capsule Take 1 capsule (40 mg total) by mouth daily. 30 capsule 5   potassium chloride (KLOR-CON) 10 MEQ tablet Take 1 tablet (10 mEq total) by mouth daily. 90 tablet 3   rivaroxaban (XARELTO) 20 MG TABS tablet TAKE 1 TABLET BY MOUTH DAILY WITH SUPPER 30 tablet 5   tamsulosin (FLOMAX) 0.4 MG CAPS capsule TAKE 1 CAPSULE (0.4 MG TOTAL) BY MOUTH DAILY. 90 capsule 1   Tiotropium Bromide Monohydrate (SPIRIVA RESPIMAT) 2.5 MCG/ACT AERS Inhale 2 puffs into the lungs daily.     No facility-administered medications prior to visit.    Allergies  Allergen Reactions   Iodine Other (See Comments)    neck swells   Iohexol Swelling    Neck and gland swelling per patient.    Review of Systems  Respiratory:  Positive for shortness of breath.   Cardiovascular:  Positive for chest pain.      Objective:    Physical Exam Constitutional:      General: He is not in acute distress.    Appearance: He is well-developed.  HENT:     Head: Normocephalic and atraumatic.  Cardiovascular:     Rate and Rhythm: Normal rate and regular rhythm.     Heart sounds: No murmur heard. Pulmonary:     Effort: Pulmonary effort is normal. No respiratory distress.     Breath sounds: Wheezing (bilateral with poor air movement to the bases) present. No rales.  Skin:    General: Skin is warm and dry.  Neurological:     Mental Status: He is alert and oriented to person, place, and time.  Psychiatric:        Behavior: Behavior normal.        Thought Content: Thought content normal.    BP 123/73 (BP Location: Right Arm, Patient Position: Sitting, Cuff Size: Small)   Pulse 66   Temp 97.9 F (36.6 C) (Oral)   Resp 16   Ht 5\' 7"  (1.702  m)   Wt 169 lb (76.7 kg)   SpO2 99%   BMI 26.47 kg/m  Wt Readings from Last 3 Encounters:  10/03/21 169 lb (76.7 kg)  10/01/21 166 lb (75.3 kg)  08/07/21 166 lb (75.3 kg)       Assessment & Plan:   Problem List Items Addressed This Visit       Unprioritized    COPD exacerbation ([place])    Slight improvement in his symptoms following albuterol nebulizer.  Will given solumedrol 125mg  IM here in the office which will be followed by a prednisone taper.  Continue Advair and albuterol. Obtain follow up CXR.       Relevant Medications   predniSONE (DELTASONE) 10 MG tablet   Other Relevant Orders   DG Chest 2 View   Atypical chest pain - Primary    New.  I suspect that this is due to lung tightness from his COPD exacerbation. EKG tracing is personally reviewed.  EKG notes NSR.  LAFB. I have reviewed this with this EKG with Dr. Johnsie Cancel, cardiologist on call and he reports that pt's EKG looks fine in his opinion.       Relevant Orders   EKG 12-Lead (Completed)    I am having Fransisca Connors start on predniSONE. I am also having him maintain his omeprazole, Xarelto, multivitamin with minerals, atorvastatin, aspirin EC, acetaminophen, furosemide, potassium chloride, Spiriva Respimat, albuterol, Advair HFA, amiodarone, and tamsulosin.  Meds ordered this encounter  Medications   predniSONE (DELTASONE) 10 MG tablet    Sig: Take 4 tablets by mouth once daily for 2 days, then 3 tabs daily x 2 days, then 2 tabs daily x 2 days, then 1 tab daily x 2 days    Dispense:  20 tablet    Refill:  0    Order Specific Question:   Supervising Provider    Answer:   Penni Homans A [[id number]]   56 minutes spent on today's visit. Time was spent coordinating care, formulating medical plan, counseling.

## 2021-10-03 NOTE — Assessment & Plan Note (Signed)
Slight improvement in his symptoms following albuterol nebulizer.  Will given solumedrol 125mg  IM here in the office which will be followed by a prednisone taper.  Continue Advair and albuterol. Obtain follow up CXR.

## 2021-10-03 NOTE — Assessment & Plan Note (Signed)
New.  I suspect that this is due to lung tightness from his COPD exacerbation. EKG tracing is personally reviewed.  EKG notes NSR.  LAFB. I have reviewed this with this EKG with Dr. Johnsie Cancel, cardiologist on call and he reports that pt's EKG looks fine in his opinion.

## 2021-10-12 ENCOUNTER — Other Ambulatory Visit: Payer: Self-pay | Admitting: Family

## 2021-10-12 ENCOUNTER — Other Ambulatory Visit (HOSPITAL_BASED_OUTPATIENT_CLINIC_OR_DEPARTMENT_OTHER): Payer: Self-pay

## 2021-10-12 ENCOUNTER — Ambulatory Visit: Payer: Medicare Other | Admitting: Family

## 2021-10-12 ENCOUNTER — Telehealth: Payer: Self-pay | Admitting: Family

## 2021-10-12 ENCOUNTER — Ambulatory Visit (INDEPENDENT_AMBULATORY_CARE_PROVIDER_SITE_OTHER): Payer: Medicare Other | Admitting: Family

## 2021-10-12 VITALS — BP 111/60 | HR 60 | Temp 97.7°F | Resp 16 | Ht 67.0 in | Wt 172.4 lb

## 2021-10-12 DIAGNOSIS — R0789 Other chest pain: Secondary | ICD-10-CM

## 2021-10-12 DIAGNOSIS — J441 Chronic obstructive pulmonary disease with (acute) exacerbation: Secondary | ICD-10-CM | POA: Diagnosis not present

## 2021-10-12 DIAGNOSIS — J449 Chronic obstructive pulmonary disease, unspecified: Secondary | ICD-10-CM | POA: Diagnosis not present

## 2021-10-12 MED ORDER — OMEPRAZOLE 40 MG PO CPDR
40.0000 mg | DELAYED_RELEASE_CAPSULE | Freq: Every day | ORAL | 1 refills | Status: DC
  Filled 2021-10-12: qty 90, 90d supply, fill #0
  Filled 2022-01-14: qty 90, 90d supply, fill #1

## 2021-10-12 NOTE — Telephone Encounter (Signed)
Scott, I have been following him the last few weeks for a COPD exacerbation.  He had stable EKG.  He still is having some atypical chest pain and DOE.  Would your team be able to get him in for a follow up appointment please?

## 2021-10-12 NOTE — Assessment & Plan Note (Signed)
Some clinical improvement. Still wheezing however on exam. Will continue Advair, albuterol prn. I don't think he is currently using spiriva.  Will refer back to pulmonology for further management.

## 2021-10-12 NOTE — Progress Notes (Addendum)
Subjective:     Patient ID: Robert Bates, male    DOB: 30-Dec-1940, 80 y.o.   MRN: [id number]  Chief Complaint  Patient presents with   Follow-up    Here for 1 week Follow Up     HPI Patient is in today for follow up of his acute COPD exacerbation. Last visit we treated him with IM solumedrol which was followed by a prednisone taper. CXR was performed and noted the following:  Chronic retracting left perihilar scarring. Persistent small left pleural effusion.  He reports that he is breathing better overall. Does still note some DOE. Yesterday he had some pain that radiated into his back. Denies any pain today.   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   COPD (chronic obstructive pulmonary disease) (HCC)    Coronary artery disease    Diabetes type 2, controlled ([place]) 07/31/2017   Dyspnea    Dysrhythmia    a fib   GERD (gastroesophageal reflux disease)    Hematuria    refuses work up or referral - understands risks of morbidity / mortality - 11/2008, 12/2008   History of hiatal hernia    History of kidney stones    Hyperlipemia    Meningioma ([place]) 10/25/2013   Follows with Dr. [name].    Peripheral vascular disease ([place])    Abdominal Aortic Aneursym   Pneumonia    as a child   Radiation 09/18/15-10/25/15   left lower lobe 70.2 Gy   Seizures ([place]) 02/18/2020   Tobacco abuse     Past Surgical History:  Procedure Laterality Date   CHOLECYSTECTOMY N/A 07/23/2017   Procedure: LAPAROSCOPIC CHOLECYSTECTOMY;  Surgeon: Mickeal Skinner, MD;  Location: [facility];  Service: General;  Laterality: N/A;   CLIPPING OF ATRIAL APPENDAGE Left  05/08/2021   Procedure: CLIPPING OF ATRIAL APPENDAGE USING 35 ATRICLIP;  Surgeon: Wonda Olds, MD;  Location: [facility];  Service: Open Heart Surgery;  Laterality: Left;   COLONOSCOPY     CORONARY ARTERY BYPASS GRAFT N/A 05/08/2021   Procedure: CORONARY ARTERY BYPASS GRAFTING (CABG)X 3 USING LEFT INTERNAL MAMMARY ARTERY AND RIGHT GREATER SAPEHNOUS VEIN;  Surgeon: Wonda Olds, MD;  Location: [facility];  Service: Open Heart Surgery;  Laterality: N/A;   ENDOVEIN HARVEST OF GREATER SAPHENOUS VEIN Right 05/08/2021   Procedure: ENDOVEIN HARVEST OF GREATER SAPHENOUS VEIN;  Surgeon: Wonda Olds, MD;  Location: [facility];  Service: Open Heart Surgery;  Laterality: Right;   EYE SURGERY Bilateral    Cataracts removed w/ lens implant   HERNIA REPAIR     Left 36 years ago . Right inguinal hernia repair 10-01-17 Dr. Kieth Brightly   INGUINAL HERNIA REPAIR Right 10/01/2017   Procedure: RIGHT INGUINAL HERNIA REPAIR WITH MESH;  Surgeon: Kinsinger, Arta Bruce, MD;  Location: [facility];  Service: General;  Laterality: Right;  TAP BLOCK   INSERTION OF MESH Right 10/01/2017   Procedure: INSERTION OF MESH;  Surgeon: Kieth Brightly Arta Bruce, MD;  Location: [facility];  Service: General;  Laterality: Right;   IR THORACENTESIS ASP PLEURAL SPACE W/IMG GUIDE  05/18/2021   IR THORACENTESIS ASP PLEURAL SPACE W/IMG GUIDE  06/07/2021   LEFT HEART CATH AND CORONARY ANGIOGRAPHY N/A 04/20/2021   Procedure: LEFT HEART CATH AND CORONARY ANGIOGRAPHY;  Surgeon: Wellington Hampshire, MD;  Location: [facility];  Service: Cardiovascular;  Laterality: N/A;   TEE WITHOUT CARDIOVERSION N/A 05/08/2021   Procedure: TRANSESOPHAGEAL ECHOCARDIOGRAM (TEE);  Surgeon: Wonda Olds, MD;  Location: [facility];  Service: Open Heart Surgery;  Laterality: N/A;   TONSILLECTOMY     TONSILLECTOMY     VIDEO BRONCHOSCOPY Bilateral 07/26/2015   Procedure: VIDEO BRONCHOSCOPY WITH FLUORO;  Surgeon: [name], MD;  [facility];  Service: Cardiopulmonary;   Laterality: Bilateral;   VIDEO BRONCHOSCOPY WITH ENDOBRONCHIAL NAVIGATION N/A 08/23/2015   Procedure: VIDEO BRONCHOSCOPY WITH ENDOBRONCHIAL NAVIGATION;  Surgeon: Grace Isaac, MD;  Location[facility];  Service: Thoracic;  Laterality: N/A;   VIDEO BRONCHOSCOPY WITH ENDOBRONCHIAL ULTRASOUND N/A 08/23/2015   Procedure: VIDEO BRONCHOSCOPY WITH ENDOBRONCHIAL ULTRASOUND;  Surgeon: Grace Isaac, MD;  Location: [facility];  Service: Thoracic;  Laterality: N/A;    Family History  Problem Relation Age of Onset   Leukemia Father    Emphysema Father    Learning disabilities Son    Leukemia Other    Stroke Other     Social History   Socioeconomic History   Marital status: Married    Spouse name: Not on file   Number of children: 2   Years of education: Not on file   Highest education level: Not on file  Occupational History   Occupation: Retired    Fish farm manager: [employer]    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[employer]  Tobacco Use   Smoking status: Former    Packs/day: 1.00    Years: 57.00    Pack years: 57.00    Types: Cigarettes, Cigars    Quit date: 08/08/2015    Years since quitting: 6.1   Smokeless tobacco: Former    Types: Chew    Quit date: 11/04/1958   Tobacco comments:    occasional use sneaks around  Vaping Use   Vaping Use: Former  Substance and Sexual Activity   Alcohol use: Not Currently    Alcohol/week: 0.0 standard drinks   Drug use: No   Sexual activity: Not Currently  Other Topics Concern   Not on file  Social History Narrative   Not on file   Social Determinants of Health   Financial Resource Strain: Medium Risk   Difficulty of Paying Living Expenses: Somewhat hard  Food Insecurity: No Food Insecurity   Worried About Charity fundraiser in the Last Year: Never true   [place] in the Last Year: Never true  Transportation Needs: No Transportation Needs   Lack of Transportation (Medical): No   Lack of Transportation (Non-Medical): No   Physical Activity: Insufficiently Active   Days of Exercise per Week: 2 days   Minutes of Exercise per Session: 40 min  Stress: No Stress Concern Present   Feeling of Stress : Not at all  Social Connections: Socially Isolated   Frequency of Communication with Friends and Family: Once a week   Frequency of Social Gatherings with Friends and Family: Once a week   Attends Religious Services: Never   Marine scientist or Organizations: No   Attends Archivist Meetings: Never   Marital Status: Married  Human resources officer Violence: Not At Risk   Fear of Current or  Ex-Partner: No   Emotionally Abused: No   Physically Abused: No   Sexually Abused: No    Outpatient Medications Prior to Visit  Medication Sig Dispense Refill   acetaminophen (TYLENOL) 500 MG tablet Take 1-2 tablets (500-1,000 mg total) by mouth every 6 (six) hours as needed. 30 tablet 0   albuterol (VENTOLIN HFA) 108 (90 Base) MCG/ACT inhaler Inhale 2 puffs into the lungs every 6 (six) hours as needed for wheezing or shortness of breath. 8.5 g 5   amiodarone (PACERONE) 200 MG tablet Take 1 tablet (200 mg total) by mouth 2 (two) times daily for 5 days, then take 1 tablet (200 mg) once daily thereafter (Patient taking differently: Take 200 mg by mouth daily.) 35 tablet 1   aspirin EC 81 MG tablet Take 1 tablet (81 mg total) by mouth in the morning. Swallow whole.     atorvastatin (LIPITOR) 80 MG tablet Take 1 tablet (80 mg total) by mouth daily. 90 tablet 1   fluticasone-salmeterol (ADVAIR HFA) 115-21 MCG/ACT inhaler Inhale 2 puffs into the lungs 2 (two) times daily. 24 g 4   furosemide (LASIX) 20 MG tablet Take 1 tablet (20 mg total) by mouth daily. 90 tablet 3   Multiple Vitamin (MULTIVITAMIN WITH MINERALS) TABS tablet Take 1 tablet by mouth daily in the afternoon.     potassium chloride (KLOR-CON) 10 MEQ tablet Take 1 tablet (10 mEq total) by mouth daily. 90 tablet 3   predniSONE (DELTASONE) 10 MG tablet Take 4  tablets by mouth once daily for 2 days, then 3 tabs daily x 2 days, then 2 tabs daily x 2 days, then 1 tab daily x 2 days 20 tablet 0   rivaroxaban (XARELTO) 20 MG TABS tablet TAKE 1 TABLET BY MOUTH DAILY WITH SUPPER 30 tablet 5   tamsulosin (FLOMAX) 0.4 MG CAPS capsule TAKE 1 CAPSULE (0.4 MG TOTAL) BY MOUTH DAILY. 90 capsule 1   Tiotropium Bromide Monohydrate (SPIRIVA RESPIMAT) 2.5 MCG/ACT AERS Inhale 2 puffs into the lungs daily.     omeprazole (PRILOSEC) 40 MG capsule Take 1 capsule (40 mg total) by mouth daily. 30 capsule 5   No facility-administered medications prior to visit.    Allergies  Allergen Reactions   Iodine Other (See Comments)    neck swells   Iohexol Swelling    Neck and gland swelling per patient.    ROS    See HPI  Objective:    Physical Exam Constitutional:      General: He is not in acute distress.    Appearance: He is well-developed.  HENT:     Head: Normocephalic and atraumatic.  Cardiovascular:     Rate and Rhythm: Normal rate and regular rhythm.     Heart sounds: No murmur heard. Pulmonary:     Effort: Pulmonary effort is normal. No respiratory distress.     Breath sounds: Wheezing present. No rales.  Skin:    General: Skin is warm and dry.  Neurological:     Mental Status: He is alert and oriented to person, place, and time.  Psychiatric:        Behavior: Behavior normal.        Thought Content: Thought content normal.    BP 111/60 (BP Location: Right Arm, Patient Position: Sitting, Cuff Size: Small)   Pulse 60   Temp 97.7 F (36.5 C) (Oral)   Resp 16   Ht 5\' 7"  (1.702 m)   Wt 172 lb 6.4 oz (78.2 kg)  SpO2 96%   BMI 27.00 kg/m  Wt Readings from Last 3 Encounters:  10/12/21 172 lb 6.4 oz (78.2 kg)  10/03/21 169 lb (76.7 kg)  10/01/21 166 lb (75.3 kg)       Assessment & Plan:   Problem List Items Addressed This Visit       Unprioritized   COPD GOLD II  - Primary   Relevant Orders   Ambulatory referral to Pulmonology    COPD exacerbation ([place])    Some clinical improvement. Still wheezing however on exam. Will continue Advair, albuterol prn. I don't think he is currently using spiriva.  Will refer back to pulmonology for further management.       Atypical chest pain    I suspect that this is related to his COPD exacerbation, but given his cardiac hx, I would like to get him back in for a follow up with cardiology.        I am having Fransisca Connors maintain his Xarelto, multivitamin with minerals, atorvastatin, aspirin EC, acetaminophen, furosemide, potassium chloride, Spiriva Respimat, albuterol, Advair HFA, amiodarone, tamsulosin, predniSONE, and omeprazole.  Meds ordered this encounter  Medications   omeprazole (PRILOSEC) 40 MG capsule    Sig: Take 1 capsule (40 mg total) by mouth daily.    Dispense:  90 capsule    Refill:  1    Order Specific Question:   Supervising Provider    Answer:   Penni Homans A [[id number]]

## 2021-10-12 NOTE — Telephone Encounter (Signed)
Yes.  We can. Danielle - Can you add him on with me or one of the other APPs? Richardson Dopp, PA-C    10/12/2021 11:51 AM

## 2021-10-12 NOTE — Addendum Note (Signed)
Addended by: [name] on: 10/12/2021 11:58 AM   Modules accepted: Orders

## 2021-10-12 NOTE — Assessment & Plan Note (Signed)
I suspect that this is related to his COPD exacerbation, but given his cardiac hx, I would like to get him back in for a follow up with cardiology.

## 2021-10-15 NOTE — Telephone Encounter (Signed)
Spoke with pt and he has been scheduled to see Dr. Acie Fredrickson 10/17/2021, with understanding to arrive at 11:30 for registration.  Pt verbalized understanding.

## 2021-10-16 ENCOUNTER — Institutional Professional Consult (permissible substitution): Payer: Medicare Other | Admitting: Student

## 2021-10-17 ENCOUNTER — Ambulatory Visit (INDEPENDENT_AMBULATORY_CARE_PROVIDER_SITE_OTHER): Payer: Medicare Other | Admitting: Internal Medicine

## 2021-10-17 ENCOUNTER — Encounter: Payer: Self-pay | Admitting: Internal Medicine

## 2021-10-17 ENCOUNTER — Other Ambulatory Visit: Payer: Self-pay

## 2021-10-17 ENCOUNTER — Encounter: Payer: Self-pay | Admitting: Cardiovascular Disease

## 2021-10-17 ENCOUNTER — Other Ambulatory Visit (HOSPITAL_BASED_OUTPATIENT_CLINIC_OR_DEPARTMENT_OTHER): Payer: Self-pay

## 2021-10-17 ENCOUNTER — Ambulatory Visit (INDEPENDENT_AMBULATORY_CARE_PROVIDER_SITE_OTHER): Payer: Medicare Other | Admitting: Cardiovascular Disease

## 2021-10-17 VITALS — BP 130/64 | HR 63 | Ht 67.0 in | Wt 172.6 lb

## 2021-10-17 VITALS — BP 120/60 | HR 62 | Ht 67.0 in | Wt 173.0 lb

## 2021-10-17 DIAGNOSIS — I480 Paroxysmal atrial fibrillation: Secondary | ICD-10-CM | POA: Diagnosis not present

## 2021-10-17 DIAGNOSIS — J449 Chronic obstructive pulmonary disease, unspecified: Secondary | ICD-10-CM

## 2021-10-17 DIAGNOSIS — I2510 Atherosclerotic heart disease of native coronary artery without angina pectoris: Secondary | ICD-10-CM

## 2021-10-17 MED ORDER — BREZTRI AEROSPHERE 160-9-4.8 MCG/ACT IN AERO
2.0000 | INHALATION_SPRAY | Freq: Two times a day (BID) | RESPIRATORY_TRACT | 5 refills | Status: DC
  Filled 2021-10-17: qty 10.7, 30d supply, fill #0

## 2021-10-17 MED ORDER — ALBUTEROL SULFATE (2.5 MG/3ML) 0.083% IN NEBU
2.5000 mg | INHALATION_SOLUTION | Freq: Four times a day (QID) | RESPIRATORY_TRACT | 12 refills | Status: DC | PRN
  Filled 2021-10-17: qty 75, 7d supply, fill #0

## 2021-10-17 NOTE — Progress Notes (Signed)
The patient has been prescribed the inhaler breztri. Inhaler technique was demonstrated to patient. The patient subsequently demonstrated correct technique.

## 2021-10-17 NOTE — Patient Instructions (Addendum)
Please schedule follow up scheduled with myself in 3 months.  If my schedule is not open yet, we will contact you with a reminder closer to that time. Please call [phone] if you haven't heard from [place] a month before.   Stop advair. Start breztri, 2 puffs twice a day. Continue albuterol as needed.  I will give you a nebulizer machine to be delivered to your house. You can pick up the albuterol treatments for the machine at the pharmacy   Understanding COPD   What is COPD? COPD stands for chronic obstructive pulmonary (lung) disease. COPD is a general term used for several lung diseases.  COPD is an umbrella term and encompasses other  common diseases in this group like chronic bronchitis and emphysema. Chronic asthma may also be included in this group. While some patients with COPD have only chronic bronchitis or emphysema, most patients have a combination of both.  You might hear these terms used in exchange for one another.   COPD adds to the work of the heart. Diseased lungs may reduce the amount of oxygen that goes to the blood. High blood pressure in blood vessels from the heart to the lungs makes it difficult for the heart to pump. Lung disease can also cause the body to produce too many red blood cells which may make the blood thicker and harder to pump.   Patients who have COPD with low oxygen levels may develop an enlarged heart (cor pulmonale). This condition weakens the heart and causes increased shortness of breath and swelling in the legs and feet.   Chronic bronchitis Chronic bronchitis is irritation and inflammation (swelling) of the lining in the bronchial tubes (air passages). The irritation causes coughing and an excess amount of mucus in the airways. The swelling makes it difficult to get air in and out of the lungs. The small, hair-like structures on the inside of the airways (called cilia) may be damaged by the irritation. The cilia are then unable to help clean mucus from  the airways.  Bronchitis is generally considered to be chronic when you have: a productive cough (cough up mucus) and shortness of breath that lasts about 3 months or more each year for 2 or more years in a row. Your doctor may define chronic bronchitis differently.   Emphysema Emphysema is the destruction, or breakdown, of the walls of the alveoli (air sacs) located at the end of the bronchial tubes. The damaged alveoli are not able to exchange oxygen and carbon dioxide between the lungs and the blood. The bronchioles lose their elasticity and collapse when you exhale, trapping air in the lungs. The trapped air keeps fresh air and oxygen from entering the lungs.   Who is affected by COPD? Emphysema and chronic bronchitis affect approximately 16 million people in the [place], or close to 11 percent of the population.   Symptoms of COPD  Shortness of breath  Shortness of breath with mild exercise (walking, using the stairs, etc.)  Chronic, productive cough (with mucus)  A feeling of "tightness" in the chest  Wheezing   What causes COPD? The two primary causes of COPD are cigarette smoking and alpha1-antitrypsin (AAT) deficiency. Air pollution and occupational dusts may also contribute to COPD, especially when the person exposed to these substances is a cigarette smoker.  Cigarette smoke causes COPD by irritating the airways and creating inflammation that narrows the airways, making it more difficult to breathe. Cigarette smoke also causes the cilia to stop  working properly so mucus and trapped particles are not cleaned from the airways. As a result, chronic cough and excess mucus production develop, leading to chronic bronchitis.  In some people, chronic bronchitis and infections can lead to destruction of the small airways, or emphysema.  AAT deficiency, an inherited disorder, can also lead to emphysema. Alpha antitrypsin (AAT) is a protective material produced in the liver and transported  to the lungs to help combat inflammation. When there is not enough of the chemical AAT, the body is no longer protected from an enzyme in the white blood cells.   How is COPD diagnosed?  To diagnose COPD, the physician needs to know: Do you smoke?  Have you had chronic exposure to dust or air pollutants?  Do other members of your family have lung disease?  Are you short of breath?  Do you get short of breath with exercise?  Do you have chronic cough and/or wheezing?  Do you cough up excess mucus?  To help with the diagnosis, the physician will conduct a thorough physical exam which includes:  Listening to your lungs and heart  Checking your blood pressure and pulse  Examining your nose and throat  Checking your feet and ankles for swelling   Laboratory and other tests Several laboratory and other tests are needed to confirm a diagnosis of COPD. These tests may include:  Chest X-ray to look for lung changes that could be caused by COPD   Spirometry and pulmonary function tests (PFTs) to determine lung volume and air flow  Pulse oximetry to measure the saturation of oxygen in the blood  Arterial blood gases (ABGs) to determine the amount of oxygen and carbon dioxide in the blood  Exercise testing to determine if the oxygen level in the blood drops during exercise   Treatment In the beginning stages of COPD, there is minimal shortness of breath that may be noticed only during exercise. As the disease progresses, shortness of breath may worsen and you may need to wear an oxygen device.   To help control other symptoms of COPD, the following treatments and lifestyle changes may be prescribed.  Quitting smoking  Avoiding cigarette smoke and other irritants  Taking medications including: a. bronchodilators b. anti-inflammatory agents c. oxygen d. antibiotics  Maintaining a healthy diet  Following a structured exercise program such as pulmonary rehabilitation Preventing respiratory  infections  Controlling stress   If your COPD progresses, you may be eligible to be evaluated for lung volume reduction surgery or lung transplantation. You may also be eligible to participate in certain clinical trials (research studies). Ask your health care providers about studies being conducted in your hospital.   What is the outlook? Although COPD can not be cured, its symptoms can be treated and your quality of life can be improved. Your prognosis or outlook for the future will depend on how well your lungs are functioning, your symptoms, and how well you respond to and follow your treatment plan.

## 2021-10-17 NOTE — Progress Notes (Signed)
Robert Bates    [id number]    02/19/1941  Primary Care Physician:O'Sullivan, Lenna Sciara, NP  Referring Physician: [name], NP [address] [name],  [place] [address] Reason for Consultation: COPD Date of Consultation: 10/17/2021  Chief complaint:   Chief Complaint  Patient presents with   Consult    Patient states that he has shortness of breath with exertion and sometimes his chest hurts/ get tight. States he has been wheezing a lot lately.      HPI: Robert Bates is a 80 y.o. man who presents for new patient evaluation of dyspnea, chest tightness and wheezing for the last 2 weeks. He is a mouth breather and has trouble breathing out of his nose due to sinus congestion. 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 In remission.   He does have recurrent bronchitis and most recently needed prednisone, antibiotics and nebulizer treatment in PCP's office.   On Advair HFA 2 puffs twice a day with prn albuterol. Takes albuteorl at least twice a day.   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  He wishes he could be more active.     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  Exposures: lives with wife, son Smoking history: currently only smokes cigars once/week.  2 packs a day x 50 years - 100 pack years  Social History   Occupational History   Occupation: Retired    Fish farm manager: [employer]    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[place] Use   Smoking status: Former    Packs/day: 1.00    Years: 57.00    Pack years: 57.00    Types: Cigarettes, Cigars    Quit date: 08/08/2015    Years since quitting: 6.1   Smokeless tobacco: Former    Types: Chew    Quit date: 11/04/1958   Tobacco comments:    Will smoke cigar every once in awhile  Vaping Use   Vaping Use: Former  Substance and  Sexual Activity   Alcohol use: Not Currently    Alcohol/week: 0.0 standard drinks   Drug use: No   Sexual activity: Not Currently    Relevant family history: Family History  Problem Relation Age of Onset   Leukemia Father    Emphysema Father    Learning disabilities Son    Atrial fibrillation Son    Leukemia Other    Stroke Other     Past Medical History:  Diagnosis Date   Anxiety    Arthritis    Cancer ([place]) 2016   lung- 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   COPD (chronic obstructive pulmonary disease) (HCC)    Coronary artery disease    Diabetes type 2, controlled ([place]) 07/31/2017   Dyspnea    Dysrhythmia    a fib   GERD (gastroesophageal reflux disease)    Hematuria    refuses work up or referral - understands risks of morbidity / mortality - 11/2008, 12/2008   History of hiatal hernia    History of kidney stones    Hyperlipemia    Meningioma ([place]) 10/25/2013   Follows with Dr. [name].    Peripheral vascular disease (HCC)    Abdominal Aortic  Aneursym   Pneumonia    as a child   Radiation 09/18/15-10/25/15   left lower lobe 70.2 Gy   Seizures ([place]) 02/18/2020   Tobacco abuse     Past Surgical History:  Procedure Laterality Date   CHOLECYSTECTOMY N/A 07/23/2017   Procedure: LAPAROSCOPIC CHOLECYSTECTOMY;  Surgeon: Kinsinger, Arta Bruce, MD;  [facility];  Service: General;  Laterality: N/A;   CLIPPING OF ATRIAL APPENDAGE Left 05/08/2021   Procedure: CLIPPING OF ATRIAL APPENDAGE USING 43 ATRICLIP;  Surgeon: Wonda Olds, MD;  Location: [facility];  Service: Open Heart Surgery;  Laterality: Left;   COLONOSCOPY     CORONARY ARTERY BYPASS GRAFT N/A 05/08/2021   Procedure: CORONARY ARTERY BYPASS GRAFTING (CABG)X 3 USING LEFT INTERNAL MAMMARY ARTERY AND RIGHT GREATER SAPEHNOUS VEIN;  Surgeon: Wonda Olds, MD;  Location: [facility];  Service: Open Heart Surgery;  Laterality: N/A;   ENDOVEIN HARVEST  OF GREATER SAPHENOUS VEIN Right 05/08/2021   Procedure: ENDOVEIN HARVEST OF GREATER SAPHENOUS VEIN;  Surgeon: Wonda Olds, MD;  Location: [facility];  Service: Open Heart Surgery;  Laterality: Right;   EYE SURGERY Bilateral    Cataracts removed w/ lens implant   HERNIA REPAIR     Left 36 years ago . Right inguinal hernia repair 10-01-17 Dr. Kieth Brightly   INGUINAL HERNIA REPAIR Right 10/01/2017   Procedure: RIGHT INGUINAL HERNIA REPAIR WITH MESH;  Surgeon: Kinsinger, Arta Bruce, MD;  [facility];  Service: General;  Laterality: Right;  TAP BLOCK   INSERTION OF MESH Right 10/01/2017   Procedure: INSERTION OF MESH;  Surgeon: Kieth Brightly Arta Bruce, MD;  [facility];  Service: General;  Laterality: Right;   IR THORACENTESIS ASP PLEURAL SPACE W/IMG GUIDE  05/18/2021   IR THORACENTESIS ASP PLEURAL SPACE W/IMG GUIDE  06/07/2021   LEFT HEART CATH AND CORONARY ANGIOGRAPHY N/A 04/20/2021   Procedure: LEFT HEART CATH AND CORONARY ANGIOGRAPHY;  Surgeon: Wellington Hampshire, MD;  Location: [facility];  Service: Cardiovascular;  Laterality: N/A;   TEE WITHOUT CARDIOVERSION N/A 05/08/2021   Procedure: TRANSESOPHAGEAL ECHOCARDIOGRAM (TEE);  Surgeon: Wonda Olds, MD;  Location: [facility];  Service: Open Heart Surgery;  Laterality: N/A;   TONSILLECTOMY     TONSILLECTOMY     VIDEO BRONCHOSCOPY Bilateral 07/26/2015   Procedure: VIDEO BRONCHOSCOPY WITH FLUORO;  Surgeon: [name], MD;  [facility];  Service: Cardiopulmonary;  Laterality: Bilateral;   VIDEO BRONCHOSCOPY WITH ENDOBRONCHIAL NAVIGATION N/A 08/23/2015   Procedure: VIDEO BRONCHOSCOPY WITH ENDOBRONCHIAL NAVIGATION;  Surgeon: Grace Isaac, MD;  Location: [facility];  Service: Thoracic;  Laterality: N/A;   VIDEO BRONCHOSCOPY WITH ENDOBRONCHIAL ULTRASOUND N/A 08/23/2015   Procedure: VIDEO BRONCHOSCOPY WITH ENDOBRONCHIAL ULTRASOUND;  Surgeon: Grace Isaac, MD;  [facility];  Service: Thoracic;  Laterality: N/A;      Physical Exam: Blood pressure 120/60, pulse 62, height 5\' 7"  (1.702 m), weight 173 lb (78.5 kg), SpO2 98 %. Gen:      No acute distress ENT:  no nasal polyps, mucus membranes moist Lungs:   diminished bilaterally, bilateral end expiratory wheezes, no increased wob.  CV:         Regular rate and rhythm; no murmurs, rubs, or gallops.  No pedal edema Abd:      + bowel sounds; soft, non-tender; no distension MSK: no acute synovitis of DIP or PIP joints, no mechanics hands.  Skin:      Warm and dry; no rashes Neuro: normal speech,  no focal facial asymmetry Psych: alert and oriented x3, normal mood and affect   Data Reviewed/Medical Decision Making:  Independent interpretation of tests: Imaging:  Review of patient's CT Chest and chest xyra images revealed stable LUL scarring, left lower pleural effusion. The patient's images have been independently reviewed by me.    PFTs: I have personally reviewed the patient's PFTs and moderate airflow limitation. PFT Results Latest Ref Rng & Units 05/19/2017 08/25/2015  FVC-Pre L 2.84 3.43  FVC-Predicted Pre % 73 87  FVC-Post L 3.16 3.87  FVC-Predicted Post % 81 98  Pre FEV1/FVC % % 61 67  Post FEV1/FCV % % 61 65  FEV1-Pre L 1.74 2.31  FEV1-Predicted Pre % 62 81  FEV1-Post L 1.93 2.50  DLCO uncorrected ml/min/mmHg 16.14 18.35  DLCO UNC% % 54 61  DLCO corrected ml/min/mmHg 16.10 -  DLCO COR %Predicted % 54 -  DLVA Predicted % 71 67  TLC L 5.61 6.24  TLC % Predicted % 84 93  RV % Predicted % 108 121    Labs:  Lab Results  Component Value Date   WBC 6.8 06/14/2021   HGB 10.4 (L) 06/14/2021   HCT 32.4 (L) 06/14/2021   MCV 94.2 06/14/2021   PLT 247 06/14/2021   Lab Results  Component Value Date   NA 141 08/03/2021   K 4.6 08/03/2021   CL 104 08/03/2021   CO2 30 08/03/2021     Immunization status:  Immunization History  Administered Date(s) Administered   Fluad Quad(high Dose 65+) 07/23/2019, 09/04/2020, 07/13/2021    Influenza Split 08/02/2011, 08/18/2012   Influenza Whole 08/17/2008   Influenza, High Dose Seasonal PF 08/09/2015, 09/30/2016, 07/30/2017, 08/07/2018   Influenza,inj,Quad PF,6+ Mos 07/27/2013, 08/09/2014   PFIZER Comirnaty(Gray Top)Covid-19 Tri-Sucrose Vaccine 02/16/2021   PFIZER(Purple Top)SARS-COV-2 Vaccination 01/28/2020, 02/21/2020, 09/04/2020   Pfizer Covid-19 Vaccine Bivalent Booster 24yrs & up 08/03/2021   Pneumococcal Conjugate-13 08/09/2014   Pneumococcal Polysaccharide-23 05/14/2010   Td 07/25/2008     I reviewed prior external note(s) from ED visits, PCP, oncology, ct surgery  I reviewed the result(s) of the labs and imaging as noted above.   I have ordered nebulzier machine  Assessment:  Moderate COPD 62% of predicted, worsening control CAD s/p CABG July 2022 Left Pleural effusion Atrial Fibrillation   Plan/Recommendations:  Stop advair. Start breztri. Continue using albuterol as needed. Will get him a nebulizer machine for home use.  The left pleural effusion appears to be decreasing in size on subsequent imaging and is likely post-operative. I don't think it is contributing to his dyspnea.   We discussed disease management and progression at length today regarding COPD.  \  Return to Care: Return in about 3 months (around 01/15/2022).  Lenice Llamas, MD [facility] [place]  CC: [name], NP

## 2021-10-17 NOTE — Patient Instructions (Signed)
Medication Instructions:   Your physician recommends that you continue on your current medications as directed. Please refer to the Current Medication list given to you today.  *If you need a refill on your cardiac medications before your next appointment, please call your pharmacy*   Testing/Procedures:  Your physician has requested that you have an echocardiogram. Echocardiography is a painless test that uses sound waves to create images of your heart. It provides your doctor with information about the size and shape of your heart and how well your hearts chambers and valves are working. This procedure takes approximately one hour. There are no restrictions for this procedure.   Follow-Up:  6 MONTHS WITH SCOTT WEAVER PA-C IN THE OFFICE

## 2021-10-17 NOTE — Progress Notes (Signed)
Cardiology Office Note:    Date:  10/17/2021   ID:  [name], DOB 14-Dec-1940, MRN [id number]  PCP:  [name], NP  Cardiologist:  Mertie Moores, MD    Referring MD: [name], NP   Chief Complaint  Patient presents with   Atrial Fibrillation   Hypertension   Coronary Artery Disease    Previous notes:     Robert Bates is a 80 y.o. male with a hx of  PAF. He has documented PAF on monitor Cannot tell that he is in AF  I met him in the hospital  He has a history of tobacco abuse, alcoholism, lung cancer, COPD, peripheral vascular disease, abdominal aortic aneurysm, hypertension   Nov, 5, 2021: [name] is seen today for follow of his PAF, tobacco abuse, alcoholism, lung CA, COPD, AAA HTN Still working , truck driving   On xarelto 20 mg a day  As a thoracic aneurism followed by dr. Inda Merlin    Dec. 14, 2022: Robert Bates is seen today for worsening shortness of breath  Hx of cigarette smoking, lung CA, COPD, AAA , HTN  He has had more dyspnea recently  Also some atypical CP and DOE   Has PAF - is on Xarelto  Has lots of DOE and wheezing .  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   COPD (chronic obstructive pulmonary disease) (HCC)    Coronary artery disease    Diabetes type 2, controlled ([place]) 07/31/2017   Dyspnea    Dysrhythmia    a fib   GERD (gastroesophageal reflux disease)    Hematuria    refuses work up or referral - understands risks of morbidity / mortality - 11/2008, 12/2008   History of hiatal hernia    History of kidney stones    Hyperlipemia    Meningioma ([place]) 10/25/2013   Follows with Dr. [name].    Peripheral vascular disease ([place])    Abdominal Aortic Aneursym    Pneumonia    as a child   Radiation 09/18/15-10/25/15   left lower lobe 70.2 Gy   Seizures ([place]) 02/18/2020   Tobacco abuse     Past Surgical History:  Procedure Laterality Date   CHOLECYSTECTOMY N/A 07/23/2017   Procedure: LAPAROSCOPIC CHOLECYSTECTOMY;  Surgeon: Mickeal Skinner, MD;  [facility];  Service: General;  Laterality: N/A;   CLIPPING OF ATRIAL APPENDAGE Left 05/08/2021   Procedure: CLIPPING OF ATRIAL APPENDAGE USING 54 ATRICLIP;  Surgeon: Wonda Olds, MD;  Location: [facility];  Service: Open Heart Surgery;  Laterality: Left;   COLONOSCOPY     CORONARY ARTERY BYPASS GRAFT N/A 05/08/2021   Procedure: CORONARY ARTERY BYPASS GRAFTING (CABG)X 3 USING LEFT INTERNAL MAMMARY ARTERY AND RIGHT GREATER SAPEHNOUS VEIN;  Surgeon: Wonda Olds, MD;  Location: [facility];  Service: Open Heart Surgery;  Laterality: N/A;   ENDOVEIN HARVEST OF GREATER SAPHENOUS VEIN Right 05/08/2021   Procedure: ENDOVEIN HARVEST OF GREATER SAPHENOUS VEIN;  Surgeon: Wonda Olds, MD;  Location: [facility];  Service: Open Heart Surgery;  Laterality: Right;   EYE SURGERY Bilateral    Cataracts removed w/ lens implant   HERNIA REPAIR  Left 36 years ago . Right inguinal hernia repair 10-01-17 Dr. Kieth Brightly   INGUINAL HERNIA REPAIR Right 10/01/2017   Procedure: RIGHT INGUINAL HERNIA REPAIR WITH MESH;  Surgeon: Kinsinger, Arta Bruce, MD;  [facility];  Service: General;  Laterality: Right;  TAP BLOCK   INSERTION OF MESH Right 10/01/2017   Procedure: INSERTION OF MESH;  Surgeon: Kieth Brightly Arta Bruce, MD;  [facility];  Service: General;  Laterality: Right;   IR THORACENTESIS ASP PLEURAL SPACE W/IMG GUIDE  05/18/2021   IR THORACENTESIS ASP PLEURAL SPACE W/IMG GUIDE  06/07/2021   LEFT HEART CATH AND CORONARY ANGIOGRAPHY N/A 04/20/2021   Procedure: LEFT HEART CATH AND CORONARY ANGIOGRAPHY;  Surgeon: Wellington Hampshire, MD;  Location: [facility];  Service: Cardiovascular;  Laterality: N/A;   TEE  WITHOUT CARDIOVERSION N/A 05/08/2021   Procedure: TRANSESOPHAGEAL ECHOCARDIOGRAM (TEE);  Surgeon: Wonda Olds, MD;  Location: [facility];  Service: Open Heart Surgery;  Laterality: N/A;   TONSILLECTOMY     TONSILLECTOMY     VIDEO BRONCHOSCOPY Bilateral 07/26/2015   Procedure: VIDEO BRONCHOSCOPY WITH FLUORO;  Surgeon: [name], MD;  [facility];  Service: Cardiopulmonary;  Laterality: Bilateral;   VIDEO BRONCHOSCOPY WITH ENDOBRONCHIAL NAVIGATION N/A 08/23/2015   Procedure: VIDEO BRONCHOSCOPY WITH ENDOBRONCHIAL NAVIGATION;  Surgeon: Grace Isaac, MD;  Location: [facility];  Service: Thoracic;  Laterality: N/A;   VIDEO BRONCHOSCOPY WITH ENDOBRONCHIAL ULTRASOUND N/A 08/23/2015   Procedure: VIDEO BRONCHOSCOPY WITH ENDOBRONCHIAL ULTRASOUND;  Surgeon: Grace Isaac, MD;  [facility];  Service: Thoracic;  Laterality: N/A;    Current Medications: Current Meds  Medication Sig   acetaminophen (TYLENOL) 500 MG tablet Take 1-2 tablets (500-1,000 mg total) by mouth every 6 (six) hours as needed.   albuterol (VENTOLIN HFA) 108 (90 Base) MCG/ACT inhaler Inhale 2 puffs into the lungs every 6 (six) hours as needed for wheezing or shortness of breath.   amiodarone (PACERONE) 200 MG tablet Take 1 tablet (200 mg total) by mouth 2 (two) times daily for 5 days, then take 1 tablet (200 mg) once daily thereafter   atorvastatin (LIPITOR) 80 MG tablet Take 1 tablet (80 mg total) by mouth daily.   fluticasone-salmeterol (ADVAIR HFA) 115-21 MCG/ACT inhaler Inhale 2 puffs into the lungs 2 (two) times daily.   omeprazole (PRILOSEC) 40 MG capsule Take 1 capsule (40 mg total) by mouth daily.   rivaroxaban (XARELTO) 20 MG TABS tablet TAKE 1 TABLET BY MOUTH DAILY WITH SUPPER   tamsulosin (FLOMAX) 0.4 MG CAPS capsule TAKE 1 CAPSULE (0.4 MG TOTAL) BY MOUTH DAILY.     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[place] Use   Smoking status: Former    Packs/day: 1.00    Years: 57.00    Pack years: 57.00    Types: Cigarettes, Cigars    Quit date: 08/08/2015    Years since quitting: 6.1   Smokeless tobacco: Former    Types: Chew    Quit date: 11/04/1958   Tobacco comments:    occasional use sneaks around  Vaping Use   Vaping Use:  Former  Substance and Sexual Activity   Alcohol use: Not Currently    Alcohol/week: 0.0 standard drinks   Drug use: No   Sexual activity: Not Currently  Other Topics Concern   Not on file  Social History Narrative   Not on file   Social Determinants of Health   Financial Resource Strain: Medium Risk   Difficulty of Paying Living Expenses: Somewhat hard  Food Insecurity: No Food Insecurity   Worried About Charity fundraiser in the Last Year: Never true   Ran Out of Food in the Last Year: Never true  Transportation Needs: No Transportation Needs   Lack of Transportation (Medical): No   Lack of Transportation (Non-Medical): No  Physical Activity: Insufficiently Active   Days of Exercise per Week: 2 days   Minutes of Exercise per Session: 40 min  Stress: No Stress Concern Present   Feeling of Stress : Not at all  Social Connections: Socially Isolated   Frequency of Communication with Friends and Family: Once a week   Frequency of Social Gatherings with Friends and Family: Once a week   Attends Religious Services: Never   Marine scientist or Organizations: No   Attends Music therapist: Never   Marital Status: Married     Family History: The patient's family history includes Emphysema in his father; Learning disabilities in his son; Leukemia in his father and another family member; Stroke in an other family  member. ROS:   Please see the history of present illness.     All other systems reviewed and are negative.  EKGs/Labs/Other Studies Reviewed:    The following studies were reviewed today:   EKG:   Dec. 14, 2022;:   NSR at 63.  No ST or T wave changes.   Recent Labs: 05/20/2021: Magnesium 2.0 06/14/2021: ALT 13; Hemoglobin 10.4; Platelet Count 247 08/03/2021: BUN 19; Creatinine, Ser 0.96; Potassium 4.6; Sodium 141; TSH 4.26  Recent Lipid Panel    Component Value Date/Time   CHOL 140 07/13/2021 1336   TRIG 72.0 07/13/2021 1336   HDL 67.00 07/13/2021 1336   CHOLHDL 2 07/13/2021 1336   VLDL 14.4 07/13/2021 1336   LDLCALC 58 07/13/2021 1336   LDLCALC 62 04/11/2021 1421   LDLDIRECT 142.2 12/26/2008 1032    Physical Exam:     Physical Exam: Blood pressure 130/64, pulse 63, height _0  (1.702 m), weight 172 lb 9.6 oz (78.3 kg), SpO2 98 %.  GEN:  Well nourished, well developed in no acute distress HEENT: Normal NECK: No JVD; No carotid bruits LYMPHATICS: No lymphadenopathy CARDIAC: RRR , no murmurs, rubs, gallops RESPIRATORY:   + stridor, / pseudo stridor this normal) regarding walking clear to auscultation without rales, wheezing or rhonchi  ABDOMEN: Soft, non-tender, non-distended MUSCULOSKELETAL:  No edema; No deformity  SKIN: Warm and dry NEUROLOGIC:  Alert and oriented x 3   ASSESSMENT:    1. Coronary artery disease involving native coronary artery of native heart without angina pectoris   2. COPD mixed type ([place])   3. PAF (paroxysmal atrial fibrillation) ([place])     PLAN:      1.  Paroxysmal atrial fibrillation:   Remains in NSR .   No change in therapy  2. Hyperlipidemia:   labs are stable     3. Lung cancer:    4.  CAD :   s/p CABG .   No recent angina   5.  Wheezing :  / pseudowheezing .  Sounds more like stridor He will be seeing pulmonary clinic today  Advised him to stop smoking completely  Will get an echo to assess his LV function to make sure  he has not developed CHF    Medication Adjustments/Labs and Tests Ordered: Current medicines are reviewed at length with the patient today.  Concerns regarding medicines are outlined above.  Orders Placed This Encounter  Procedures   EKG 12-Lead   ECHOCARDIOGRAM COMPLETE    No orders of the defined types were placed in this encounter.     Signed, Mertie Moores, MD  10/17/2021 12:21 PM    [facility]

## 2021-10-23 ENCOUNTER — Other Ambulatory Visit (HOSPITAL_BASED_OUTPATIENT_CLINIC_OR_DEPARTMENT_OTHER): Payer: Self-pay

## 2021-10-24 ENCOUNTER — Other Ambulatory Visit (HOSPITAL_BASED_OUTPATIENT_CLINIC_OR_DEPARTMENT_OTHER): Payer: Self-pay

## 2021-10-24 ENCOUNTER — Telehealth: Payer: Self-pay | Admitting: Internal Medicine

## 2021-10-24 NOTE — Telephone Encounter (Signed)
Spoke with the pt  He states that no one has called him about his neb machine order  We had sent this to [facility] on 10/17/21  Referral notes indicate that it was received  I called [facility] and spoke with Terri  She states that they did not have any machines in on the date that we sent order  They just got some in for today and will call the pt now to let him know  Nothing further needed

## 2021-10-25 ENCOUNTER — Other Ambulatory Visit (HOSPITAL_BASED_OUTPATIENT_CLINIC_OR_DEPARTMENT_OTHER): Payer: Self-pay

## 2021-10-25 ENCOUNTER — Ambulatory Visit (INDEPENDENT_AMBULATORY_CARE_PROVIDER_SITE_OTHER): Payer: Medicare Other | Admitting: Pharmacist

## 2021-10-25 DIAGNOSIS — J449 Chronic obstructive pulmonary disease, unspecified: Secondary | ICD-10-CM

## 2021-10-25 DIAGNOSIS — J42 Unspecified chronic bronchitis: Secondary | ICD-10-CM | POA: Diagnosis not present

## 2021-10-25 DIAGNOSIS — J439 Emphysema, unspecified: Secondary | ICD-10-CM | POA: Diagnosis not present

## 2021-10-25 DIAGNOSIS — I480 Paroxysmal atrial fibrillation: Secondary | ICD-10-CM

## 2021-10-26 NOTE — Chronic Care Management (AMB) (Signed)
Chronic Care Management Pharmacy Note  10/26/2021 Name:  Robert Bates MRN:  [id number] DOB:  Apr 12, 1941  Summary;  Patient was to start [facility] and stop Adviar per pulmonology recommendations 10/17/2021  but he reports he was unable to afford.  Stated application for [employer] and sent message to Dr Shearon Stalls to see if she would agree to sign application. Also let he know that patient was not able to get Breztri.  I instructed patient to continue Advair until he gets Robert Bates (hoping the pulmonary office might have at least 1 sample available)  His benefits will start over 11/04/2021 and cost of Breztril will be $47 per month at least until he reached Medicare coverage gap.   Subjective: [name] is an 80 y.o. year old male who is a primary patient of [name], NP.  The CCM team was consulted for assistance with disease management and care coordination needs.    Collaboration with patient and pharmacy  for  follow up  in response to provider referral for pharmacy case management and/or care coordination services.   Consent to Services:  The patient was given information about Chronic Care Management services, agreed to services, and gave verbal consent prior to initiation of services.  Please see initial visit note for detailed documentation.   Patient Care Team: [name], NP as PCP - General Nahser, Wonda Cheng, MD as PCP - Cardiology (Cardiology) Elsie Stain, MD as Attending Physician (Pulmonary Disease) Virgina Evener, OD (Optometry) Cherre Robins, RPH-CPP (Pharmacist) Sharmon Revere as Physician Assistant (Cardiology)  Recent office visits: 10/12/2021 - Int Med Inda Castle NP) F/U COPD exacerbation. Referred to pulmonology and cardiology 10/03/2021 -  Int Med Inda Castle NP) COPD exacerbation. Neb treatment and solumedrol in office. Prescribed prednisone 10mg  - take 40mg  for 2 days 30mg  for 2 days, 20mg  for 2 days then 10mg  for 2 days 07/13/2021 -  PCP Inda Castle, NP) F/U chronic conditions. Removed furosemide from med list (patient preferance) and tramadols (completed course)  Recent consult visits: 10/17/2021 - Pulmonology (Dr Shearon Stalls) new patient evaluation of dyspnea, chest tightness and wheezing. Stopped Advair. Started [employer]. also prescribed home nebulizer machine to use albuterol as needed.  10/17/2021 - Cardio (Dr Acie Fredrickson) F/U afib. Ordered ECHO due to dyspnea. No medication changes noted.  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  Continued Xarelto for Afib; Plan to stop aspirin 81mg  3 months from CABG (October 2022 - but pt to check with surgeon). Added furosemide 20mg  daily and potassium 10 mEq daily. Recheck BMP in 2 weeks.  07/11/2021 - Cardiothoracic surgery Harriet Pho, [facility]) F/u CABG with complecation of plural effusion and left thoracenteisi 05/18/2021 and 06/07/2021. Notes suggest restarting furosemide for LEE but not added back to med list.  06/28/2021 - Cardiothoracic Surgery Harriet Pho, [facility]) F/U CABG with complication of plural effusion. Xray orderd. Patient sent for thoracentesis. Recommended continue furosemide 40mg  daily and potassium 20 mEq daily. 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 Sondra Come. Currently on observation. Repeat CT scan of the chest performed recently showed no concerning findings for disease recurrence or metastasis.  The pleural effusion is secondary to his recent cardiac surgery. Recommended cont observation with repeat chest CT in 1 year 06/11/2021 - Cardio ([place], [facility]) Left plural effusion re-accumulation per xray. Recommended continue furosemide 40mg   daily and potassium 20 mEq daily. Recheck in 2 weeks.  06/05/2021 - Cardio [place], [facility] F/U S/P  CABG. Continue Aspirin until 3 months post CABG, Stopped metoprolol due to lightheadedness and low HR. Started furosemide 40mg  daily for 7 days and as needed thereafter for weight gain > 3 lbs and potassium 20 mEq daily for 7 days and as needed when taking furosemide.  BMET checked and will be repeated in 1 week.  06/04/2021 - Cardiothoracic surgery Harriet Pho, [facility]) F/U CABG and left plural effusion. Need thoracentesis on 05/18/2021 while in hospital. Unable to give diuretic due to low BP. Ordered US guided thoracentesis with repeat xray in 1 week.  05/28/2021 - Cardio ([place], PAC) Afib Clinic. Noted to be bradycardic. Recommended lower dose of metoprolol to 12.5mg  bid. No other med changes.  05/24/2021 - Cardiothoracic surgery (Dr Orvan Seen) F/U CABG. No med changes.     Hospital visits: 05/08/2021 to 07/18/[facility] Admission at [facility] - Admitted for CABG x 3 performed on 05/08/2021. Post Op blood loss anemia with transfusion on 07/6. Medications Started: cephalexin 500mg  every 8 hours  Dulera 200/33mcg - 2 puffs twice a day  Metoprolol tartrate 25mg  twice a day  Potassium chloride 10 mEq daily for 4 days  Torsemide 20mg  take 1 tablet daily for 4 days Medications Stopped: Advair HFA 115/21 mcg and NTG SL Medication Changes: amiodarone 200mg  bid for 5 days, then once a day thereafter   Objective:  Lab Results  Component Value Date   CREATININE 0.96 08/03/2021   CREATININE 0.86 06/14/2021   CREATININE 0.86 06/05/2021    Lab Results  Component Value Date   HGBA1C 5.8 07/13/2021   Last diabetic Eye exam: No results found for: HMDIABEYEEXA  Last diabetic Foot exam: No results found for: HMDIABFOOTEX      Component Value Date/Time   CHOL 140 07/13/2021 1336   TRIG 72.0 07/13/2021 1336   HDL 67.00 07/13/2021 1336   CHOLHDL 2 07/13/2021 1336   VLDL 14.4 07/13/2021 1336   LDLCALC 58 07/13/2021 1336   LDLCALC 62 04/11/2021 1421   LDLDIRECT 142.2 12/26/2008 1032    Hepatic  Function Latest Ref Rng & Units 06/14/2021 05/04/2021 12/08/2020  Total Protein 6.5 - 8.1 g/dL 5.9(L) 5.9(L) 6.1  Albumin 3.5 - 5.0 g/dL 2.8(L) 3.3(L) -  AST 15 - 41 U/L 11(L) 16 13  ALT 0 - 44 U/L 13 13 12   Alk Phosphatase 38 - 126 U/L 103 80 -  Total Bilirubin 0.3 - 1.2 mg/dL 0.4 0.5 0.4  Bilirubin, Direct 0.0 - 0.3 mg/dL - - -    Lab Results  Component Value Date/Time   TSH 4.26 08/03/2021 08:25 AM   TSH 1.31 11/08/2019 02:55 PM    CBC Latest Ref Rng & Units 06/14/2021 05/17/2021 05/15/2021  WBC 4.0 - 10.5 K/uL 6.8 9.3 9.4  Hemoglobin 13.0 - 17.0 g/dL 10.4(L) 9.5(L) 9.5(L)  Hematocrit 39.0 - 52.0 % 32.4(L) 29.9(L) 29.7(L)  Platelets 150 - 400 K/uL 247 302 268    No results found for: VD25OH  Clinical ASCVD: Yes  The ASCVD Risk score (Arnett DK, et al., 2019) failed to calculate for the following reasons:   The 2019 ASCVD risk score is only valid for ages 86 to 58    Other: CHADS2VASc = 5   Social History   Tobacco Use  Smoking Status Former   Packs/day: 1.00   Years: 57.00   Pack years: 57.00   Types: Cigarettes, Cigars   Quit date:  08/08/2015   Years since quitting: 6.2  Smokeless Tobacco Former   Types: Chew   Quit date: 11/04/1958  Tobacco Comments   Will smoke cigar every once in awhile   BP Readings from Last 3 Encounters:  10/17/21 120/60  10/17/21 130/64  10/12/21 111/60   Pulse Readings from Last 3 Encounters:  10/17/21 62  10/17/21 63  10/12/21 60   Wt Readings from Last 3 Encounters:  10/17/21 173 lb (78.5 kg)  10/17/21 172 lb 9.6 oz (78.3 kg)  10/12/21 172 lb 6.4 oz (78.2 kg)    Assessment: Review of patient past medical history, allergies, medications, health status, including review of consultants reports, laboratory and other test data, was performed as part of comprehensive evaluation and provision of chronic care management services.   SDOH:  (Social Determinants of Health) assessments and interventions performed:  SDOH Interventions     Flowsheet Row Most Recent Value  SDOH Interventions   Financial Strain Interventions Other (Comment)  [working on [employer] app since just started 10/2021]         CCM Care Plan  Allergies  Allergen Reactions   Iodine Other (See Comments)    neck swells   Iohexol Swelling    Neck and gland swelling per patient.    Medications Reviewed Today     Reviewed by Spero Geralds, MD (Physician) on 10/17/21 at [place] List Status: <None>   Medication Order Taking? Sig Documenting Provider Last Dose Status Informant  acetaminophen (TYLENOL) 500 MG tablet [id number] Yes Take 1-2 tablets (500-1,000 mg total) by mouth every 6 (six) hours as needed. Lars Pinks M, PA-C Taking Active   albuterol (VENTOLIN HFA) 108 (90 Base) MCG/ACT inhaler [id number] Yes Inhale 2 puffs into the lungs every 6 (six) hours as needed for wheezing or shortness of breath. [name], NP Taking Active   amiodarone (PACERONE) 200 MG tablet [id number] Yes Take 1 tablet (200 mg total) by mouth 2 (two) times daily for 5 days, then take 1 tablet (200 mg) once daily thereafter Nahser, Wonda Cheng, MD Taking Active   aspirin EC 81 MG tablet [id number] Yes Take 1 tablet (81 mg total) by mouth in the morning. Swallow whole. Nani Skillern, PA-C Taking Active   atorvastatin (LIPITOR) 80 MG tablet [id number] Yes Take 1 tablet (80 mg total) by mouth daily. Margie Billet, NP Taking Active   fluticasone-salmeterol (ADVAIR HFA) 431-54 MCG/ACT inhaler [id number] Yes Inhale 2 puffs into the lungs 2 (two) times daily. [name], NP Taking Active   furosemide (LASIX) 20 MG tablet [id number] Yes Take 1 tablet (20 mg total) by mouth daily. Richardson Dopp T, [place] Taking Active     Discontinued 05/08/21 1058 Multiple Vitamin (MULTIVITAMIN WITH MINERALS) TABS tablet [id number] Yes Take 1 tablet by mouth daily in the afternoon. [provider] Taking Active   omeprazole (PRILOSEC) 40 MG capsule [id number] Yes Take 1  capsule (40 mg total) by mouth daily. [name], NP Taking Active   potassium chloride (KLOR-CON) 10 MEQ tablet [id number] Yes Take 1 tablet (10 mEq total) by mouth daily. Richardson Dopp T, PA-C Taking Active   rivaroxaban (XARELTO) 20 MG TABS tablet [id number] Yes TAKE 1 TABLET BY MOUTH DAILY WITH SUPPER Nahser, Wonda Cheng, MD Taking Active   tamsulosin (FLOMAX) 0.4 MG CAPS capsule [id number] Yes TAKE 1 CAPSULE (0.4 MG TOTAL) BY MOUTH DAILY. [name], NP Taking Active             Patient Active Problem List  Diagnosis Date Noted   COPD exacerbation ([place]) 10/03/2021   Persistent atrial fibrillation ([place]) 05/28/2021   Secondary hypercoagulable state ([place]) 05/28/2021   Malnutrition of moderate degree 05/15/2021   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   Coronary artery disease 05/08/2021   COVID-19 virus infection 04/23/2021   Unstable angina (HCC) 04/20/2021   Erectile dysfunction 04/11/2021   Head trauma 03/07/2021   Left leg pain 04/07/2020   Benign prostatic hyperplasia with nocturia 03/08/2020   Seizures ([place]) 02/18/2020   PAF (paroxysmal atrial fibrillation) ([place]) 08/15/2017   Diabetes type 2, controlled ([place]) 07/31/2017   COPD GOLD II  04/03/2017   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[place]) 09/06/2015   Lung cancer ([place]) 11/07/2014   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   HTN (hypertension) 04/29/2014   Meningioma ([place]) 10/25/2013   Low back pain 10/25/2013   Osteoarthritis 08/18/2012   Atypical chest pain 08/14/2011   KERATOSIS 10/09/2010   SCIATICA, RIGHT 10/09/2010   UNSPECIFIED HEARING LOSS 05/21/2010   Hyperlipidemia 05/14/2010   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   RENAL CYST, RIGHT 02/05/2010   Abdominal aortic aneurysm 01/29/2010   LIPOMA 11/17/2009   MICROSCOPIC HEMATURIA 08/11/2008   GERD 07/26/2008   DERMOID CYST 07/25/2008    Immunization History  Administered Date(s) Administered   Fluad Quad(high Dose 65+) 07/23/2019, 09/04/2020,  07/13/2021   Influenza Split 08/02/2011, 08/18/2012   Influenza Whole 08/17/2008   Influenza, High Dose Seasonal PF 08/09/2015, 09/30/2016, 07/30/2017, 08/07/2018   Influenza,inj,Quad PF,6+ Mos 07/27/2013, 08/09/2014   PFIZER Comirnaty(Gray Top)Covid-19 Tri-[place] Vaccine 02/16/2021   PFIZER(Purple Top)SARS-COV-2 Vaccination 01/28/2020, 02/21/2020, 09/04/2020   Pfizer Covid-19 Vaccine Bivalent Booster 22yrs & up 08/03/2021   Pneumococcal Conjugate-13 08/09/2014   Pneumococcal Polysaccharide-23 05/14/2010   Td 07/25/2008    Conditions to be addressed/monitored: Atrial Fibrillation, HTN, HLD, COPD and BPH; type 2 DM; GERD; seizure, aortic aneurysm.  Care Plan : General Pharmacy (Adult)  Updates made by Cherre Robins, RPH-CPP since 10/26/2021 12:00 AM     Problem: Management of chronic medicaiton conditions and medication: Atrial Fibrillation, HTN, HLD, type 2 DM, Pulmonary Disease and BPH; seizures; h/o lung cancer and meningioma; GERD; aortic aneurysm   Priority: High  Onset Date: 02/08/2021  Note:   Current Barriers:  Does not adhere to prescribed medication regimen Management of chronic medical conditions and medication management Unable to afford medication therapy - improved  Pharmacist Clinical Goal(s):  Over the next 180 days, patient will achieve adherence to monitoring guidelines and medication adherence to achieve therapeutic efficacy contact provider office for questions/concerns as evidenced notation of same in electronic health record through collaboration with PharmD and provider.   Interventions: 1:1 collaboration with [name], NP regarding development and update of comprehensive plan of care as evidenced by provider attestation and co-signature Inter-disciplinary care team collaboration (see longitudinal plan of care) Comprehensive medication review performed; medication list updated in electronic medical record  Diabetes: Controlled; A1c goal <  7.0% Last A1c was 5.8 % (07/13/2021) - has not taken metformin in over 3 months.  Current treatment:  none Current glucose readings: does not check BG at home Current meal patterns: eating a little better since CABG 05/2021. He has been out of work so eating out less. 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  Interventions:  (addressed at previous visit)  Educated on limiting high CHO foods; recommended increase non starchy vegetables Counseled on A1c goal.   Hypertension (BP goal <130/80) / CHF with preserved EF  Controlled  [place] treatment:  Furosemide 20mg  daily (lowered from 40mg ) Potassium chloride 10 mEq daily (lowered from 20 mEq)  Previously took diltiazem but stopped during hospitalization when amiodarone was started; tried metoprolol - bradycardia Patient checks BP at home infrequently - no readings provided EF was 60 to 65% 04/2021 Denies hypotensive/hypertensive symptoms Interventions: (addressed at previous visit) Educated on BP goals Recommended continue current therpay   Hyperlipidemia / CAD (LDL < 70) Controlled Recent CABG x 3 vessels 05/08/2021. Patient continues to have issues with plural effusion s/p CABG.  Followed by cardio and cardiothoracic surgery team.  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 Recommended try to increase intake of non starchy vegetables Recommended continue current therapy Consider recheck lipids with next labs Reviewed refill history and assess adherence - patient filled atorvastatin #90 on 04/26/21 and 08/07/2021 - will finish year at 94% adherence.   Chronic Obstructive Pulmonary Disease: Controlled COPD monitored by Dr Shearon Stalls - first  visit 10/2021 Taking furosemide + KCL for plural effusion; monitored by cardiothoracic surgery Current treatment:  albuterol inhaler or nebulizer solution use as needed for shortness of breath or wheezing (rescue inhaler) Breztri - inhaler two puffs into lungs twice a day (newly prescribed this week by pulmonologist but has not started due to cost) GOLD Classification: 2 (FEV1 50-79%) Most recent Pulmonary Function Testing: 05/19/2017 - spirometry FVC 2.84 (73%) FEV1 1.74 (62%) FEV1/FVC 61% Current COPD Classification:  B (CATscore >10, <2 exacerbations/yr)  08/28/2021: Contacted [employer] patient assistance today. Patient has been approved to get Advair starting 08/22/2021 thru 11/03/2021. First fill of 90 days was shipped 08/27/2021. Anticipated delivery by 09/11/21 (7 to 10 business days).  Patient notified to expect delivery. Patient evaluated by pulmonology - Dr Shearon Stalls 10/18/2021. Changed treatement to [facility] but patient has not started yet due to cost.  Interventions:  Started application for [place] and [place] Program to get [place]. (Continue to take Advair until we can get Breztri from program or benefits restart 11/04/2021 - copay should be $47).  Message sent to Dr Shearon Stalls regarding Robert Bates inhaler and patient assistance program application - will fax application to her office to sign if she OK's Continue to take furosemide and potassium as directed for plural effusion  Atrial Fibrillation: NSR since last hospitalization but did recently change from diltiazem to amiodarone due to being in afib prior to cardiac cath Current rate/rhythm controller: amiodarone 200mg  daily Anticoagulant treatment: Xarelto 20mg  daily with evening meal Mailed patient PAP application after CCM visit in June - patient brought in application today [id number] score: 5  08/22/2021: Coordinated with patient assistance program to verify he was approved for Xarelto assistance through 11/03/2021. Patient has been mailed card to use  at pharmacy for free Xarelto. I was also faxed a copy and called [facility] to give the information to fill Xarelto now.  Interventions:  Recommended continue current regimen Patient to have LFTs and TSH monitored by cardiologist regularly due to amiodarone therapy  GERD:  Patient is currently controlled on the following medications:  Omeprazole 40mg  daily Patient has failed these meds in past: None noted  Triggers: laying down after eating Interventions: (addressed at previous visit)  Recommend continue current medications    Health Maintenance:  Reviewed vaccination history and  discussed benefits of Shingrix and Tdap vaccines Has received both annual flu and COVID booster.  Patient to get the following vaccines in 2023 - Shingrix and Tdap vaccines  Patient Goals/Self-Care Activities Over the next 180 days, patient will:  take medications as prescribed and collaborate with provider on medication access solutions Follow up with cardiologist and cardiothoracic team Continue cardiac rehab program  Follow Up Plan: Telephone follow up appointment with care management team member scheduled for:  1 month                 Medication Assistance:  Advair will be obtained through [place] medication assistance program.  Enrollment ends 11/03/2021 (approval date 08/22/2021) However Advair was stopped and patient to start [facility]. Started application today. Patient signed and message sent to Lorenso Courier to see if she will sign patient assistance program application. Approved for Xarelto patient assistance thru 11/03/2021  Patient's preferred pharmacy is:  [facility] [address] [place] [address] Phone: [phone] Fax: [facility] [address] [place] [address] Phone: [phone] Fax: [phone]    Follow Up:  Patient agrees to Care Plan and Follow-up.  Plan:  Telephone follow up appointment with care management team member scheduled for:  1 month .   Cherre Robins, PharmD Clinical Pharmacist [facility]

## 2021-10-26 NOTE — Patient Instructions (Signed)
Robert Bates It was a pleasure speaking with you today.  I have attached a summary of our visit today and information about your health goals.   Our next appointment is by telephone on November 23, 2021 at 10:15am  Please call the care guide team at [phone] if you need to cancel or reschedule your appointment.    If you have any questions or concerns, please feel free to contact me either at the phone number below or with a MyChart message.   Keep up the good work!  Cherre Robins, PharmD Clinical Pharmacist [place] [phone] (direct line)  [phone] (main office number)   The patient verbalized understanding of instructions, educational materials, and care plan provided today and declined offer to receive copy of patient instructions, educational materials, and care plan.

## 2021-11-03 DIAGNOSIS — J449 Chronic obstructive pulmonary disease, unspecified: Secondary | ICD-10-CM | POA: Diagnosis not present

## 2021-11-03 DIAGNOSIS — I480 Paroxysmal atrial fibrillation: Secondary | ICD-10-CM

## 2021-11-08 ENCOUNTER — Other Ambulatory Visit: Payer: Self-pay | Admitting: Home Health

## 2021-11-08 ENCOUNTER — Other Ambulatory Visit: Payer: Self-pay | Admitting: Cardiovascular Disease

## 2021-11-08 ENCOUNTER — Other Ambulatory Visit (HOSPITAL_BASED_OUTPATIENT_CLINIC_OR_DEPARTMENT_OTHER): Payer: Self-pay

## 2021-11-08 MED ORDER — AMIODARONE HCL 200 MG PO TABS
200.0000 mg | ORAL_TABLET | Freq: Every day | ORAL | 3 refills | Status: DC
  Filled 2021-11-08: qty 90, 90d supply, fill #0
  Filled 2022-02-14: qty 90, 90d supply, fill #1

## 2021-11-09 ENCOUNTER — Other Ambulatory Visit (HOSPITAL_BASED_OUTPATIENT_CLINIC_OR_DEPARTMENT_OTHER): Payer: Self-pay

## 2021-11-09 MED ORDER — ATORVASTATIN CALCIUM 80 MG PO TABS
80.0000 mg | ORAL_TABLET | Freq: Every day | ORAL | 3 refills | Status: DC
  Filled 2021-11-09: qty 90, 90d supply, fill #0
  Filled 2022-02-14 (×2): qty 90, 90d supply, fill #1
  Filled 2022-05-09: qty 90, 90d supply, fill #2
  Filled 2022-09-19: qty 90, 90d supply, fill #3

## 2021-11-14 ENCOUNTER — Telehealth: Payer: Self-pay

## 2021-11-14 NOTE — Telephone Encounter (Signed)
Received a fax from  [place] regarding an approval for  [employer]  patient assistance from 11/13/2021 to 11/03/2022. Approval letter sent to scan center.

## 2021-11-20 ENCOUNTER — Other Ambulatory Visit: Payer: Self-pay

## 2021-11-20 ENCOUNTER — Ambulatory Visit (HOSPITAL_BASED_OUTPATIENT_CLINIC_OR_DEPARTMENT_OTHER)
Admission: RE | Admit: 2021-11-20 | Discharge: 2021-11-20 | Disposition: A | Discharge status: Home / Self Care | Payer: Medicare Other | Source: Ambulatory Visit | Attending: Cardiovascular Disease | Admitting: Cardiovascular Disease

## 2021-11-20 ENCOUNTER — Other Ambulatory Visit: Payer: Self-pay | Admitting: Physician Assistant

## 2021-11-20 ENCOUNTER — Other Ambulatory Visit (HOSPITAL_BASED_OUTPATIENT_CLINIC_OR_DEPARTMENT_OTHER): Payer: Self-pay

## 2021-11-20 DIAGNOSIS — I480 Paroxysmal atrial fibrillation: Secondary | ICD-10-CM

## 2021-11-20 DIAGNOSIS — I2510 Atherosclerotic heart disease of native coronary artery without angina pectoris: Secondary | ICD-10-CM | POA: Insufficient documentation

## 2021-11-20 DIAGNOSIS — J449 Chronic obstructive pulmonary disease, unspecified: Secondary | ICD-10-CM | POA: Insufficient documentation

## 2021-11-20 MED ORDER — RIVAROXABAN 20 MG PO TABS
ORAL_TABLET | ORAL | 5 refills | Status: DC
  Filled 2021-11-20: qty 30, 30d supply, fill #0
  Filled 2022-01-14: qty 30, 30d supply, fill #1
  Filled 2022-02-14: qty 30, 30d supply, fill #2
  Filled 2022-04-02: qty 30, 30d supply, fill #3
  Filled 2022-05-09: qty 30, 30d supply, fill #4
  Filled 2022-06-03: qty 30, 30d supply, fill #5

## 2021-11-20 NOTE — Progress Notes (Signed)
°  Echocardiogram 2D Echocardiogram has been performed.  Robert Bates F 11/20/2021, 10:13 AM

## 2021-11-20 NOTE — Telephone Encounter (Signed)
Xarelto 20mg  refill request received. Pt is 81 years old, weight-78.5kg, Crea-0.96 on 08/03/2021, last seen by Dr. Acie Fredrickson on 10/17/2021, Diagnosis-Afib, [place].9ml/min; Dose is appropriate based on dosing criteria. Will send in refill to requested pharmacy.

## 2021-11-21 LAB — ECHOCARDIOGRAM COMPLETE
AR max vel: 1.7 cm2
AV Area VTI: 1.96 cm2
AV Area mean vel: 1.58 cm2
AV Mean grad: 4 mmHg
AV Peak grad: 7.5 mmHg
Ao pk vel: 1.37 m/s
Area-P 1/2: 2.63 cm2
S' Lateral: 3.8 cm

## 2021-11-22 ENCOUNTER — Ambulatory Visit (INDEPENDENT_AMBULATORY_CARE_PROVIDER_SITE_OTHER): Payer: Medicare Other | Admitting: Pharmacist

## 2021-11-22 ENCOUNTER — Other Ambulatory Visit (HOSPITAL_BASED_OUTPATIENT_CLINIC_OR_DEPARTMENT_OTHER): Payer: Self-pay

## 2021-11-22 DIAGNOSIS — I480 Paroxysmal atrial fibrillation: Secondary | ICD-10-CM

## 2021-11-22 DIAGNOSIS — J449 Chronic obstructive pulmonary disease, unspecified: Secondary | ICD-10-CM

## 2021-11-22 DIAGNOSIS — I25110 Atherosclerotic heart disease of native coronary artery with unstable angina pectoris: Secondary | ICD-10-CM

## 2021-11-22 DIAGNOSIS — E119 Type 2 diabetes mellitus without complications: Secondary | ICD-10-CM

## 2021-11-22 DIAGNOSIS — E785 Hyperlipidemia, unspecified: Secondary | ICD-10-CM

## 2021-11-22 NOTE — Patient Instructions (Signed)
[name],  It was a pleasure speaking with you today.  I have attached a summary of our visit today and information about your health goals.   Patient Goals/Self-Care Activities Over the next 30 days, patient will:  Take medications as prescribed and collaborate with provider on medication access solutions Follow up with cardiologist and cardiothoracic teams Restart furosemide and potassium supplement - today! Check weight daily Discussed signs and symptoms of CHF exacerbation - weight gain, SOB, abdominal fullness, swelling in legs or abdomen, Fatigue and weakness, changes in ability to perform usual activities, persistent cough or wheezing with white or pink blood-tinged mucus, nausea and lack of appetite. Notify medical office if you experience these symptoms.  If you have any questions or concerns, please feel free to contact me either at the phone number below or with a MyChart message.   Keep up the good work!  Cherre Robins, PharmD Clinical Pharmacist [facility] [phone] (direct line)  [phone] (main office number) CARE PLAN ENTRY    Hypertension / congestive heart disease: BP Readings from Last 3 Encounters:  10/17/21 120/60  10/17/21 130/64  10/12/21 111/60   Pharmacist Clinical Goal(s): Over the next 90 days, patient will work with PharmD and providers to maintain BP goal <140/90 Current regimen:  Furosemide 20mg  daily (lowered from 40mg ) Potassium chloride 10 mEq daily (lowered from 20 mEq)  Interventions: Discussed blood pressure goal Patient self care activities - Over the next 90 days, patient will: Checked blood pressure at home 2 to 3 times per week and record for future visits Restart furosemide 20mg  - take 1 tablet daily (Start TODAY). Also restart potassium supplement daily Contact office if you have worsening abdominal fullness or any of these other symptoms. 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 Recommended try to increase intake of non starchy vegetables Patient self care activities - Over the next 90 days, patient will: Maintain cholesterol medication regimen. Increase intake of non starchy vegetables Recommended continue current therapy  Diabetes Lab Results  Component Value Date/Time   HGBA1C 5.8 07/13/2021 01:36 PM   HGBA1C 6.5 04/11/2021 01:41 PM   Pharmacist Clinical Goal(s): Over the next 90 days, patient will work with PharmD and providers to maintain A1c goal <7% Current regimen:  Diet and exercise recommeded Interventions: Discussed A1c goal Patient self care activities - Over the next 90 days, patient will: Maintain A1c <7% Limit intake of sugar, potatoes, pasta, rice and bread.  Consider restarting metformin 500mg  twice a day if next A1c is 6.5% or higher  COPD Pharmacist Clinical Goal(s) Over the next 90 days, patient will work with PharmD and providers to reduce symptoms associated with COPD and reduce barriers to access of preferred medication regimens  Current regimen:  Albuterol HFA 2 puffs every 6 hours as needed for wheezing Breztri inhaler - inhaler 2 puffs into lungs twice a day (patient has not started yet due to  cost)  Interventions: [name] has been approved 01/102023 through 11/03/2022. Patient self care activities - Over the next 90 days, patient will: Maintain medication regimen for COPD Albuterol inhaler is to be used only if needed (rescue inhaler)  You should receive Breztri delivery in 7 to 10 business days. If you do not received by January  30th, 2023 call me and I will check on shipment.  Continue to use Advair inhaler until [place] arrives.   Atrial fibrillation Pharmacist Clinical Goal(s) Over the next 90 days, patient will work with PharmD and providers to reduce risk of stroke associated with Afib and reduce barriers to preferred medication regimen Current regimen:  Amiodarone 200mg  daily  Xarelto 20mg  daily Interventions: None today Patient self care activities - Over the next 90 days, patient will: Maintain current medication regimen for atrial fibrillation Continue to have liver and thyroid checked every 6 months while on amiodarone therapy   Medication management Pharmacist Clinical Goal(s): Over the next 90 days, patient will work with PharmD and providers to maintain optimal medication adherence Current pharmacy: [facility] Interventions Comprehensive medication review performed. Continue current medication management strategy Patient self care activities - Over the next 90 days, patient will: Focus on medication adherence by filling and taking medications appropriately  Take medications as prescribed Report any questions or concerns to PharmD and/or provider(s) Requested refills for furosemide, potassium and Xarelto from pharmacy  Health Maintenance:  Reviewed vaccination history and discussed benefits of tetanus and Shingrix vaccine Patient to get tetanus and Shingrix vaccine in 2023.     Patient verbalizes understanding of instructions and care plan provided today and agrees to view in [place]. Active MyChart status confirmed with patient.

## 2021-11-22 NOTE — Chronic Care Management (AMB) (Signed)
Chronic Care Management Pharmacy Note  11/22/2021 Name:  Robert Bates MRN:  [id number] DOB:  12/17/1940  Summary;  Patient was to start [facility] and stop Adviar per pulmonology recommendations 10/17/2021  but he reports he was unable to afford.  [employer] patient assistance program approved 11/13/2021 but fill is in progress. Expected to be delivered in the next 7 to 10 business days. Until received patient to continue Advair.  Patient reports abdominal fullness. 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  Patient instructed to restart furosemide and potassium today. Refill requested. Will notify PCP and have Chronic Care Management CMA check on patient Monday 11/26/2021 Discussed signs and symptoms of CHF exacerbation - weight gain, SOB, abdominal fullness, swelling in legs or abdomen, Fatigue and weakness, changes in ability to perform usual activities, persistent cough or wheezing with white or pink blood-tinged mucus, nausea and lack of appetite Check weigh daily - report weight gain of more than 3 lbs in 24 hours or 5 lbs in 1 week.   Subjective: Robert Bates is an 81 y.o. year old male who is a primary patient of [name], NP.  The CCM team was consulted for assistance with disease management and care coordination needs.    Collaboration with patient , patient assistance program and pharmacy  for  follow up  in response to provider referral for pharmacy case management and/or care coordination services.   Consent to Services:  The patient was given information about Chronic Care Management services, agreed to services, and gave verbal consent prior to initiation of services.  Please see initial visit note for detailed documentation.   Patient Care Team: [name], NP as PCP - General Nahser, Wonda Cheng, MD as PCP - Cardiology (Cardiology) Elsie Stain, MD as Attending Physician (Pulmonary  Disease) Virgina Evener, OD (Optometry) Cherre Robins, RPH-CPP (Pharmacist) Sharmon Revere as Physician Assistant (Cardiology) Spero Geralds, MD as Consulting Physician (Pulmonary Disease)  Recent office visits: 10/12/2021 - Int Med Inda Castle NP) F/U COPD exacerbation. Referred to pulmonology and cardiology 10/03/2021 -  Int Med Inda Castle NP) COPD exacerbation. Neb treatment and solumedrol in office. Prescribed prednisone 10mg  - take 40mg  for 2 days 30mg  for 2 days, 20mg  for 2 days then 10mg  for 2 days 07/13/2021 - PCP Inda Castle, NP) F/U chronic conditions. Removed furosemide from med list (patient preferance) and tramadols (completed course)  Recent consult visits: 10/17/2021 - Pulmonology (Dr Shearon Stalls) new patient evaluation of dyspnea, chest tightness and wheezing. Stopped Advair. Started [employer]. also prescribed home nebulizer machine to use albuterol as needed.  10/17/2021 - Cardio (Dr Acie Fredrickson) F/U afib. Ordered ECHO due to dyspnea. No medication changes noted.  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  Continued Xarelto for Afib; Plan to stop aspirin 81mg  3 months from CABG (October 2022 - but pt to check with surgeon). Added furosemide 20mg  daily and potassium 10 mEq daily. Recheck BMP in 2 weeks.  07/11/2021 - Cardiothoracic surgery Harriet Pho, [facility]) F/u CABG with complecation of plural effusion and left thoracenteisi 05/18/2021 and 06/07/2021. Notes suggest restarting furosemide for LEE but not added back to med list.  06/28/2021 - Cardiothoracic Surgery Harriet Pho, [facility] F/U CABG with complication of plural effusion. Xray orderd. Patient sent for thoracentesis. Recommended continue furosemide 40mg  daily and potassium 20 mEq daily. 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 Sondra Come. Currently on observation. Repeat CT scan of the chest performed recently showed no concerning findings for disease recurrence or metastasis.  The pleural effusion is secondary to his recent cardiac surgery. Recommended cont observation with repeat chest CT in 1 year 06/11/2021 - Cardio ([place], [facility]) Left plural effusion re-accumulation per xray. Recommended continue furosemide 40mg  daily and potassium 20 mEq daily. Recheck in 2 weeks.  06/05/2021 - Cardio [place], [facility]) Hospital F/U S/P CABG. Continue Aspirin until 3 months post CABG, Stopped metoprolol due to lightheadedness and low HR. Started furosemide 40mg  daily for 7 days and as needed thereafter for weight gain > 3 lbs and potassium 20 mEq daily for 7 days and as needed when taking furosemide.  BMET checked and will be repeated in 1 week.  06/04/2021 - Cardiothoracic surgery Harriet Pho, [facility]) F/U CABG and left plural effusion. Need thoracentesis on 05/18/2021 while in hospital. Unable to give diuretic due to low BP. Ordered US guided thoracentesis with repeat xray in 1 week.  05/28/2021 - Cardio ([place], [facility]) Afib Clinic. Noted to be bradycardic. Recommended lower dose of metoprolol to 12.5mg  bid. No other med changes.  05/24/2021 - Cardiothoracic surgery (Dr Orvan Seen) F/U CABG. No med changes.     Hospital visits: 05[facility]08/2021 to [facility] - Admitted for CABG x 3 performed on 05/08/2021. Post Op blood loss anemia with transfusion on 07/6. Medications Started: cephalexin 500mg  every 8 hours  Dulera 200/62mcg - 2 puffs twice a day  Metoprolol tartrate 25mg  twice a day  Potassium chloride 10 mEq daily for 4 days  Torsemide 20mg  take 1 tablet daily for 4 days Medications Stopped: Advair HFA 115/21 mcg and NTG SL Medication Changes: amiodarone 200mg  bid for 5 days, then once a day thereafter   Objective:  Lab Results  Component Value Date   CREATININE 0.96  08/03/2021   CREATININE 0.86 06/14/2021   CREATININE 0.86 06/05/2021    Lab Results  Component Value Date   HGBA1C 5.8 07/13/2021   Last diabetic Eye exam: No results found for: HMDIABEYEEXA  Last diabetic Foot exam: No results found for: HMDIABFOOTEX      Component Value Date/Time   CHOL 140 07/13/2021 1336   TRIG 72.0 07/13/2021 1336   HDL 67.00 07/13/2021 1336   CHOLHDL 2 07/13/2021 1336   VLDL 14.4 07/13/2021 1336   LDLCALC 58 07/13/2021 1336   LDLCALC 62 04/11/2021 1421   LDLDIRECT 142.2 12/26/2008 1032    Hepatic Function Latest Ref Rng & Units 06/14/2021 05/04/2021 12/08/2020  Total Protein 6.5 - 8.1 g/dL 5.9(L) 5.9(L) 6.1  Albumin 3.5 - 5.0 g/dL 2.8(L) 3.3(L) -  AST 15 - 41 U/L 11(L) 16 13  ALT 0 - 44 U/L 13 13 12   Alk Phosphatase 38 - 126 U/L 103 80 -  Total Bilirubin 0.3 - 1.2 mg/dL 0.4 0.5 0.4  Bilirubin, Direct 0.0 - 0.3 mg/dL - - -    Lab Results  Component Value Date/Time   TSH 4.26 08/03/2021 08:25 AM   TSH 1.31 11/08/2019 02:55 PM    CBC Latest Ref Rng & Units 06/14/2021 05/17/2021 05/15/2021  WBC 4.0 - 10.5 K/uL 6.8 9.3 9.4  Hemoglobin 13.0 - 17.0 g/dL 10.4(L) 9.5(L) 9.5(L)  Hematocrit 39.0 - 52.0 % 32.4(L) 29.9(L) 29.7(L)  Platelets 150 - 400 K/uL 247 302 268    No results found for:  VD25OH  Clinical ASCVD: Yes  The ASCVD Risk score (Arnett DK, et al., 2019) failed to calculate for the following reasons:   The 2019 ASCVD risk score is only valid for ages 75 to 52    Other: CHADS2VASc = 5   Social History   Tobacco Use  Smoking Status Former   Packs/day: 1.00   Years: 57.00   Pack years: 57.00   Types: Cigarettes, Cigars   Quit date: 08/08/2015   Years since quitting: 6.2  Smokeless Tobacco Former   Types: Chew   Quit date: 11/04/1958  Tobacco Comments   Will smoke cigar every once in awhile   BP Readings from Last 3 Encounters:  10/17/21 120/60  10/17/21 130/64  10/12/21 111/60   Pulse Readings from Last 3 Encounters:  10/17/21 62   10/17/21 63  10/12/21 60   Wt Readings from Last 3 Encounters:  10/17/21 173 lb (78.5 kg)  10/17/21 172 lb 9.6 oz (78.3 kg)  10/12/21 172 lb 6.4 oz (78.2 kg)    Assessment: Review of patient past medical history, allergies, medications, health status, including review of consultants reports, laboratory and other test data, was performed as part of comprehensive evaluation and provision of chronic care management services.   SDOH:  (Social Determinants of Health) assessments and interventions performed:  SDOH Interventions    Flowsheet Row Most Recent Value  SDOH Interventions   Financial Strain Interventions Other (Comment)  [[place] for [place] and [place] to receive Bretri 11/13/21 thru 11/03/22 but has not shipped yet. Patient will continue Advair until received. Following for Xarelto PAP - has to reache coverage gap each year before we can apply for PAP.]         CCM Care Plan  Allergies  Allergen Reactions   Iodine Other (See Comments)    neck swells   Iohexol Swelling    Neck and gland swelling per patient.    Medications Reviewed Today     Reviewed by Cherre Robins, RPH-CPP (Pharmacist) on 11/22/21 at 1029  Med List Status: <None>   Medication Order Taking? Sig Documenting Provider Last Dose Status Informant  acetaminophen (TYLENOL) 500 MG tablet [id number] Yes Take 1-2 tablets (500-1,000 mg total) by mouth every 6 (six) hours as needed. Lars Pinks M, PA-C Taking Active   albuterol (PROVENTIL) (2.5 MG/3ML) 0.083% nebulizer solution [id number] Yes Take 3 mLs (2.5 mg total) by nebulization every 6 (six) hours as needed for wheezing or shortness of breath. Spero Geralds, MD Taking Active   albuterol (VENTOLIN HFA) 108 [phone] Base) MCG/ACT inhaler [id number] Yes Inhale 2 puffs into the lungs every 6 (six) hours as needed for wheezing or shortness of breath. [name], NP Taking Active   amiodarone (PACERONE) 200 MG tablet [id number] Yes Take 1 tablet (200 mg total)  by mouth daily. Nahser, Wonda Cheng, MD Taking Active   aspirin EC 81 MG tablet [id number] Yes Take 1 tablet (81 mg total) by mouth in the morning. Swallow whole. Nani Skillern, PA-C Taking Active   atorvastatin (LIPITOR) 80 MG tablet [id number] Yes Take 1 tablet (80 mg total) by mouth daily. Nahser, Wonda Cheng, MD Taking Active   Budeson-Glycopyrrol-Formoterol (BREZTRI AEROSPHERE) 160-9-4.8 MCG/ACT Hollie Salk [id number] No Inhale 2 puffs into the lungs in the morning and at bedtime.  Patient not taking: Reported on 11/22/2021   Spero Geralds, MD Not Taking Active            Med Note [facility] [place] [facility]   Thu Nov 22, 2021 10:28 AM) [place]ved for [place] and [place] patient assistance program 11/13/2021 thru 11/03/2022  furosemide (LASIX) 20 MG tablet [id number] No Take 1 tablet (20 mg total) by mouth daily.  Patient not taking: Reported on 11/22/2021   Richardson Dopp T, PA-C Not Taking Expired 10/18/21 2359     Discontinued 05/08/21 1058 Multiple Vitamin (MULTIVITAMIN WITH MINERALS) TABS tablet [id number] Yes Take 1 tablet by mouth daily in the afternoon. [provider] Taking Active   omeprazole (PRILOSEC) 40 MG capsule [id number] Yes Take 1 capsule (40 mg total) by mouth daily. [name], NP Taking Active   potassium chloride (KLOR-CON) 10 MEQ tablet [id number] No Take 1 tablet (10 mEq total) by mouth daily.  Patient not taking: Reported on 11/22/2021   Richardson Dopp T, PA-C Not Taking Expired 10/18/21 2359   rivaroxaban (XARELTO) 20 MG TABS tablet [id number] Yes TAKE 1 TABLET BY MOUTH DAILY WITH SUPPER Nahser, Wonda Cheng, MD Taking Active   tamsulosin (FLOMAX) 0.4 MG CAPS capsule [id number] Yes TAKE 1 CAPSULE (0.4 MG TOTAL) BY MOUTH DAILY. [name], NP Taking Active             Patient Active Problem List   Diagnosis Date Noted   COPD exacerbation ([place]) 10/03/2021   Persistent atrial fibrillation ([place]) 05/28/2021   Secondary hypercoagulable state ([place]) 05/28/2021   Malnutrition of  moderate degree 05/15/2021   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   Coronary artery disease 05/08/2021   COVID-19 virus infection 04/23/2021   Unstable angina (HCC) 04/20/2021   Erectile dysfunction 04/11/2021   Head trauma 03/07/2021   Left leg pain 04/07/2020   Benign prostatic hyperplasia with nocturia 03/08/2020   Seizures ([place]) 02/18/2020   PAF (paroxysmal atrial fibrillation) ([place]) 08/15/2017   Diabetes type 2, controlled ([place]) 07/31/2017   COPD GOLD II  04/03/2017   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[place]) 09/06/2015   Lung cancer ([place]) 11/07/2014   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   HTN (hypertension) 04/29/2014   Meningioma ([place]) 10/25/2013   Low back pain 10/25/2013   Osteoarthritis 08/18/2012   Atypical chest pain 08/14/2011   KERATOSIS 10/09/2010   SCIATICA, RIGHT 10/09/2010   UNSPECIFIED HEARING LOSS 05/21/2010   Hyperlipidemia 05/14/2010   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   RENAL CYST, RIGHT 02/05/2010   Abdominal aortic aneurysm 01/29/2010   LIPOMA 11/17/2009   MICROSCOPIC HEMATURIA 08/11/2008   GERD 07/26/2008   DERMOID CYST 07/25/2008    Immunization History  Administered Date(s) Administered   Fluad Quad(high Dose 65+) 07/23/2019, 09/04/2020, 07/13/2021   Influenza Split 08/02/2011, 08/18/2012   Influenza Whole 08/17/2008   Influenza, High Dose Seasonal PF 08/09/2015, 09/30/2016, 07/30/2017, 08/07/2018   Influenza,inj,Quad PF,6+ Mos 07/27/2013, 08/09/2014   PFIZER Comirnaty(Gray Top)Covid-19 Tri-Sucrose Vaccine 02/16/2021   PFIZER(Purple Top)SARS-COV-2 Vaccination 01/28/2020, 02/21/2020, 09/04/2020   Pfizer Covid-19 Vaccine Bivalent Booster 51yrs & up 08/03/2021   Pneumococcal Conjugate-13 08/09/2014   Pneumococcal Polysaccharide-23 05/14/2010   Td 07/25/2008    Conditions to be addressed/monitored: Atrial Fibrillation, HTN, HLD, COPD and BPH; type 2 DM; GERD; seizure, aortic aneurysm.  Care Plan : General Pharmacy (Adult)  Updates made by  Cherre Robins, RPH-CPP since 11/22/2021 12:00 AM     Problem: Management of chronic medicaiton conditions and medication: Atrial Fibrillation, HTN, HLD, type 2 DM, Pulmonary Disease and BPH; seizures; h/o lung cancer and meningioma; GERD; aortic aneurysm   Priority: High  Onset Date: 02/08/2021  Note:   Current Barriers:  Does not adhere to prescribed medication regimen  Management of chronic medical conditions and medication management Unable to afford medication therapy - improved  Pharmacist Clinical Goal(s):  Over the next 180 days, patient will achieve adherence to monitoring guidelines and medication adherence to achieve therapeutic efficacy contact provider office for questions/concerns as evidenced notation of same in electronic health record through collaboration with PharmD and provider.   Interventions: 1:1 collaboration with [name], NP regarding development and update of comprehensive plan of care as evidenced by provider attestation and co-signature Inter-disciplinary care team collaboration (see longitudinal plan of care) Comprehensive medication review performed; medication list updated in electronic medical record  Diabetes: Controlled; A1c goal < 7.0% Last A1c was 5.8 % (07/13/2021) - has not taken metformin in over 3 months.  Current treatment:  none Current glucose readings: does not check BG at home Current meal patterns: eating a little better since CABG 05/2021. He has been out of work so eating out less. 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  Interventions:  (addressed at previous visit)  Educated on limiting high CHO foods; recommended increase non starchy vegetables Counseled on A1c goal.   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 He reports he has not been taking furosemide recently because it causes him to have to make frequent bathroom stops. Also has not been taking potassium supplement Patient checks BP at home infrequently - no readings provided EF was 60 to 65% 04/2021 Denies hypotensive/hypertensive symptoms Denies LEE but reports abdominal fullness over the last few days Interventions: (addressed at previous visit) Educated on BP goals Patient was advised to restart taking furosemide daily (start today!)  Contact office if you have worsening abdominal fullness or any of these other symptoms. Weight gain, shortness of breath, abdominal fullness, swelling in legs or abdomen, Fatigue and weakness, changes in ability to perform usual activities, persistent cough or wheezing with white or pink blood-tinged mucus, nausea and lack of appetite Weigh daily - report weight gain of more than 3 lbs in 24 hours or 5 lbs in 1 week   Hyperlipidemia / CAD (LDL < 70) Controlled Recent CABG x 3 vessels 05/08/2021. Patient continues to have issues with plural effusion s/p CABG.  Followed by cardio and cardiothoracic surgery team.  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 Recommended try to increase intake of non starchy vegetables Recommended continue current therapy Consider recheck lipids with next labs Reviewed refill history and assess adherence - patient filled atorvastatin #90 on 08/07/2021 and 11/09/2021  Chronic Obstructive Pulmonary Disease: Controlled COPD monitored by Dr Shearon Stalls - first visit  10/2021 Current treatment:  albuterol inhaler or nebulizer solution use as needed for shortness of breath or wheezing (rescue inhaler) Breztri - inhaler two puffs into lungs twice a day (newly prescribed this week by pulmonologist but has not started due to cost) [place] and Me patient assistance program approved 11/13/2021. Called patient assistance program but medication has not shipped yet. Recheck in  1 to 2 weeks. For now patient continues to use Advair inhaler twice a day until [place] arrives.  Patient confirms he did receive nebulizer GOLD Classification: 2 (FEV1 50-79%) Most recent Pulmonary Function Testing: 05/19/2017 - spirometry FVC 2.84 (73%) FEV1 1.74 (62%) FEV1/FVC 61% Current COPD Classification:  B (CATscore >10, <2 exacerbations/yr)  Interventions:  Rosana Hoes as soon as received from [facility] and Me patient assistance program  (Continue to take Advair you receive Breztri) Should arrive in 7 to 10 business days   Atrial Fibrillation: NSR since last hospitalization but did recently change from diltiazem to amiodarone due to being in afib prior to cardiac cath Current rate/rhythm controller:  amiodarone $RemoveBef'200mg'QcYaRKRqPr$  daily Anticoagulant treatment:  Xarelto $Remove'20mg'OoRMZfV$  daily with evening meal CHADS2VASc score: 5  08/22/2021: Coordinated with patient assistance program to verify he was approved for Xarelto assistance through 11/03/2021. Patient has been mailed card to use at pharmacy for free Xarelto. I was also faxed a copy and called [place] pharmacy to give the information to fill Xarelto now.  Will continue to follow in 2023. Cannot apply for Xarelto patient assistance program until reaches coverage gap. Interventions:  Recommended continue current regimen Patient to have LFTs and TSH monitored by cardiologist regularly due to amiodarone therapy  GERD:  Patient is currently controlled on the following medications:  Omeprazole $RemoveBef'40mg'tnPJkKMRLJ$  daily Patient has failed these meds in past: None noted   Triggers: laying down after eating Interventions: (addressed at previous visit)  Recommend continue current medications    Health Maintenance:  Reviewed vaccination history and discussed benefits of Shingrix and Tdap vaccines Has received both annual flu and COVID booster.  Patient to get the following vaccines in 2023 - Shingrix and Tdap vaccines  Patient Goals/Self-Care Activities Over the next 180 days, patient will:  take medications as prescribed and collaborate with provider on medication access solutions Follow up with cardiologist and cardiothoracic team Continue cardiac rehab program  Follow Up Plan: Telephone follow up appointment with care management team member scheduled for:  1 month                 Medication Assistance:  Advair will be obtained through [place] medication assistance program.  Enrollment ends 11/03/2021 (approval date 08/22/2021) However Advair was stopped and patient to start [facility]. Started application today. Patient signed and message sent to Lorenso Courier to see if she will sign patient assistance program application. Approved for Xarelto patient assistance thru 11/03/2021  Patient's preferred pharmacy is:  [facility] [address] [place] [address] Phone: [phone] Fax: [place] [address] [place] [address] Phone: [phone] Fax: [phone]    Follow Up:  Patient agrees to Care Plan and Follow-up.  Plan: Telephone follow up appointment with care management team member scheduled for:  1 month with clinical pharmacist but will have Chronic Care Management CMA check on patient Monday 11/26/2021 regarding CHF symptoms.  Cherre Robins, PharmD Clinical Pharmacist [facility]

## 2021-11-23 ENCOUNTER — Telehealth: Payer: Medicare Other

## 2021-11-25 DIAGNOSIS — J449 Chronic obstructive pulmonary disease, unspecified: Secondary | ICD-10-CM | POA: Diagnosis not present

## 2021-11-29 ENCOUNTER — Ambulatory Visit: Payer: Medicare Other | Admitting: Pharmacist

## 2021-11-29 DIAGNOSIS — J449 Chronic obstructive pulmonary disease, unspecified: Secondary | ICD-10-CM

## 2021-11-29 DIAGNOSIS — I25110 Atherosclerotic heart disease of native coronary artery with unstable angina pectoris: Secondary | ICD-10-CM

## 2021-11-29 DIAGNOSIS — I480 Paroxysmal atrial fibrillation: Secondary | ICD-10-CM

## 2021-11-29 NOTE — Chronic Care Management (AMB) (Signed)
Chronic Care Management Pharmacy Note  11/29/2021 Name:  Robert Bates MRN:  [id number] DOB:  Dec 05, 1940  Summary: At previous visit 11/22/2021 patient reported he was back at works as a Administrator and he had not been taking furosemide because it caused him to make frequent bathroom stops. Also had not been taking potassium supplement. He was reporting abdominal fullness so I recommended he restart both furosemide and potassium. Today he reports that he restarted furosemide daily on days he is not working. This past week took Saturday, Sunday and Monday night. Reports that breathing and abdominal fullness has improved. He reports his weight is down about 9 lbs. Per patient (last week 180 lbs and today 171lbs)   Patient did mention that he has had increase difficulty urinating 2 days this past week (Monday 11/26/2021 and Wednesday 11/28/2021). Does not appear to be directly related to days he took diuretic. Patient endorses that he is taking tamsulosin 0.4mg  every day.  Will forward informaiton to PCP. May need appointment with PCP or referral to urology.   Patient was to start [facility] and stop Adviar per pulmonology recommendations 10/17/2021  but he reports he was unable to afford.  Breztri patient assistance program approved 11/13/2021. Called today and verified that [name] has shipped on 11/23/2021. Per tracking number patient should received 12/03/2021 by 7pm.   Subjective: Robert Bates is an 81 y.o. year old male who is a primary patient of [name], NP.  The CCM team was consulted for assistance with disease management and care coordination needs.    Collaboration with patient and patient assistance program   for  follow up  in response to provider referral for pharmacy case management and/or care coordination services.   Consent to Services:  The patient was given information about Chronic Care Management services, agreed to services, and gave verbal consent prior to  initiation of services.  Please see initial visit note for detailed documentation.   Patient Care Team: [name], NP as PCP - General Nahser, Wonda Cheng, MD as PCP - Cardiology (Cardiology) Elsie Stain, MD as Attending Physician (Pulmonary Disease) Virgina Evener, OD (Optometry) Cherre Robins, RPH-CPP (Pharmacist) Sharmon Revere as Physician Assistant (Cardiology) Spero Geralds, MD as Consulting Physician (Pulmonary Disease)  Recent office visits: 10/12/2021 - Int Med Inda Castle NP) F/U COPD exacerbation. Referred to pulmonology and cardiology 10/03/2021 -  Int Med Inda Castle NP) COPD exacerbation. Neb treatment and solumedrol in office. Prescribed prednisone 10mg  - take 40mg  for 2 days 30mg  for 2 days, 20mg  for 2 days then 10mg  for 2 days 07/13/2021 - PCP Inda Castle, NP) F/U chronic conditions. Removed furosemide from med list (patient preferance) and tramadols (completed course)  Recent consult visits: 10/17/2021 - Pulmonology (Dr Shearon Stalls) new patient evaluation of dyspnea, chest tightness and wheezing. Stopped Advair. Started [employer]. also prescribed home nebulizer machine to use albuterol as needed.  10/17/2021 - Cardio (Dr Acie Fredrickson) F/U afib. Ordered ECHO due to dyspnea. No medication changes noted.  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  Continued Xarelto for Afib; Plan to stop aspirin 81mg  3 months from CABG (October 2022 - but pt to check with surgeon). Added furosemide 20mg  daily and potassium 10 mEq daily. Recheck BMP in 2 weeks.  07/11/2021 - Cardiothoracic surgery [place] [facility], [facility]) F/u CABG with complecation of plural effusion and left thoracenteisi 05/18/2021 and 06/07/2021. Notes suggest restarting furosemide for LEE but not added back to med list.  06/28/2021 - Cardiothoracic Surgery [place], [facility])  F/U CABG with complication of plural effusion. Xray orderd. Patient sent for thoracentesis. Recommended continue furosemide 40mg  daily and potassium  20 mEq daily. 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 Sondra Come. Currently on observation. Repeat CT scan of the chest performed recently showed no concerning findings for disease recurrence or metastasis.  The pleural effusion is secondary to his recent cardiac surgery. Recommended cont observation with repeat chest CT in 1 year 06/11/2021 - Cardio ([place], [facility]) Left plural effusion re-accumulation per xray. Recommended continue furosemide 40mg  daily and potassium 20 mEq daily. Recheck in 2 weeks.  06/05/2021 - Cardio [place], [facility]) Hospital F/U S/P CABG. Continue Aspirin until 3 months post CABG, Stopped metoprolol due to lightheadedness and low HR. Started furosemide 40mg  daily for 7 days and as needed thereafter for weight gain > 3 lbs and potassium 20 mEq daily for 7 days and as needed when taking furosemide.  BMET checked and will be repeated in 1 week.  06/04/2021 - Cardiothoracic surgery Harriet Pho, [facility]) F/U CABG and left plural effusion. Need thoracentesis on 05/18/2021 while in hospital. Unable to give diuretic due to low BP. Ordered US guided thoracentesis with repeat xray in 1 week.     Hospital visits: None in last 6 months   Objective:  Lab Results  Component Value Date   CREATININE 0.96 08/03/2021   CREATININE 0.86 06/14/2021   CREATININE 0.86 06/05/2021    Lab Results  Component Value Date   HGBA1C 5.8 07/13/2021   Last diabetic Eye exam: No results found for: HMDIABEYEEXA  Last diabetic Foot exam: No results found for: HMDIABFOOTEX      Component Value Date/Time   CHOL 140 07/13/2021 1336   TRIG 72.0 07/13/2021 1336   HDL 67.00 07/13/2021 1336   CHOLHDL 2 07/13/2021 1336   VLDL 14.4 07/13/2021 1336   LDLCALC 58 07/13/2021 1336    LDLCALC 62 04/11/2021 1421   LDLDIRECT 142.2 12/26/2008 1032    Hepatic Function Latest Ref Rng & Units 06/14/2021 05/04/2021 12/08/2020  Total Protein 6.5 - 8.1 g/dL 5.9(L) 5.9(L) 6.1  Albumin 3.5 - 5.0 g/dL 2.8(L) 3.3(L) -  AST 15 - 41 U/L 11(L) 16 13  ALT 0 - 44 U/L 13 13 12   Alk Phosphatase 38 - 126 U/L 103 80 -  Total Bilirubin 0.3 - 1.2 mg/dL 0.4 0.5 0.4  Bilirubin, Direct 0.0 - 0.3 mg/dL - - -    Lab Results  Component Value Date/Time   TSH 4.26 08/03/2021 08:25 AM   TSH 1.31 11/08/2019 02:55 PM    CBC Latest Ref Rng & Units 06/14/2021 05/17/2021 05/15/2021  WBC 4.0 - 10.5 K/uL 6.8 9.3 9.4  Hemoglobin 13.0 - 17.0 g/dL 10.4(L) 9.5(L) 9.5(L)  Hematocrit 39.0 - 52.0 % 32.4(L) 29.9(L) 29.7(L)  Platelets 150 - 400 K/uL 247 302 268    No results found for: VD25OH  Clinical ASCVD: Yes  The ASCVD Risk score (Arnett DK, et al., 2019) failed to calculate for the following reasons:   The 2019 ASCVD risk score is only valid for ages 48 to 80    Other: CHADS2VASc = 5   Social History   Tobacco Use  Smoking Status Former   Packs/day: 1.00   Years: 57.00   Pack years: 57.00   Types: Cigarettes, Cigars   Quit date:  08/08/2015   Years since quitting: 6.3  Smokeless Tobacco Former   Types: Chew   Quit date: 11/04/1958  Tobacco Comments   Will smoke cigar every once in awhile   BP Readings from Last 3 Encounters:  10/17/21 120/60  10/17/21 130/64  10/12/21 111/60   Pulse Readings from Last 3 Encounters:  10/17/21 62  10/17/21 63  10/12/21 60   Wt Readings from Last 3 Encounters:  10/17/21 173 lb (78.5 kg)  10/17/21 172 lb 9.6 oz (78.3 kg)  10/12/21 172 lb 6.4 oz (78.2 kg)    Assessment: Review of patient past medical history, allergies, medications, health status, including review of consultants reports, laboratory and other test data, was performed as part of comprehensive evaluation and provision of chronic care management services.   SDOH:  (Social Determinants of  Health) assessments and interventions performed:     CCM Care Plan  Allergies  Allergen Reactions   Iodine Other (See Comments)    neck swells   Iohexol Swelling    Neck and gland swelling per patient.    Medications Reviewed Today     Reviewed by Cherre Robins, RPH-CPP (Pharmacist) on 11/29/21 at 48  Med List Status: <None>   Medication Order Taking? Sig Documenting Provider Last Dose Status Informant  acetaminophen (TYLENOL) 500 MG tablet [id number] Yes Take 1-2 tablets (500-1,000 mg total) by mouth every 6 (six) hours as needed. Lars Pinks M, PA-C Taking Active   albuterol (PROVENTIL) (2.5 MG/3ML) 0.083% nebulizer solution [id number] Yes Take 3 mLs (2.5 mg total) by nebulization every 6 (six) hours as needed for wheezing or shortness of breath. Spero Geralds, MD Taking Active   albuterol (VENTOLIN HFA) 108 [phone] Base) MCG/ACT inhaler [id number] Yes Inhale 2 puffs into the lungs every 6 (six) hours as needed for wheezing or shortness of breath. [name], NP Taking Active   amiodarone (PACERONE) 200 MG tablet [id number] Yes Take 1 tablet (200 mg total) by mouth daily. Nahser, Wonda Cheng, MD Taking Active   aspirin EC 81 MG tablet [id number] Yes Take 1 tablet (81 mg total) by mouth in the morning. Swallow whole. Nani Skillern, PA-C Taking Active   atorvastatin (LIPITOR) 80 MG tablet [id number] Yes Take 1 tablet (80 mg total) by mouth daily. Nahser, Wonda Cheng, MD Taking Active   Budeson-Glycopyrrol-Formoterol (BREZTRI AEROSPHERE) 160-9-4.8 MCG/ACT Hollie Salk [id number] No Inhale 2 puffs into the lungs in the morning and at bedtime.  Patient not taking: Reported on 11/29/2021   Spero Geralds, MD Not Taking Active            Med Note Antony Contras, Adria Dill Nov 22, 2021 10:28 AM) Approved for [place] and Me patient assistance program 11/13/2021 thru 11/03/2022  furosemide (LASIX) 20 MG tablet [id number] Yes Take 1 tablet (20 mg total) by mouth daily.  Patient taking differently:  Take 20 mg by mouth every other day.   Richardson Dopp T, [place] Taking Active     Discontinued 05/08/21 1058 Multiple Vitamin (MULTIVITAMIN WITH MINERALS) TABS tablet [id number] Yes Take 1 tablet by mouth daily in the afternoon. [provider] Taking Active   omeprazole (PRILOSEC) 40 MG capsule [id number] Yes Take 1 capsule (40 mg total) by mouth daily. [name], NP Taking Active   potassium chloride (KLOR-CON) 10 MEQ tablet [id number] Yes Take 1 tablet (10 mEq total) by mouth daily. Richardson Dopp T, PA-C Taking Active   rivaroxaban (XARELTO) 20 MG TABS tablet [id number] Yes TAKE 1 TABLET BY MOUTH  DAILY WITH SUPPER Nahser, Wonda Cheng, MD Taking Active   tamsulosin (FLOMAX) 0.4 MG CAPS capsule [id number] Yes TAKE 1 CAPSULE (0.4 MG TOTAL) BY MOUTH DAILY. [name], NP Taking Active             Patient Active Problem List   Diagnosis Date Noted   COPD exacerbation ([place]) 10/03/2021   Persistent atrial fibrillation ([place]) 05/28/2021   Secondary hypercoagulable state ([place]) 05/28/2021   Malnutrition of moderate degree 05/15/2021   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   Coronary artery disease 05/08/2021   COVID-19 virus infection 04/23/2021   Unstable angina (HCC) 04/20/2021   Erectile dysfunction 04/11/2021   Head trauma 03/07/2021   Left leg pain 04/07/2020   Benign prostatic hyperplasia with nocturia 03/08/2020   Seizures ([place]) 02/18/2020   PAF (paroxysmal atrial fibrillation) ([place]) 08/15/2017   Diabetes type 2, controlled ([place]) 07/31/2017   COPD GOLD II  04/03/2017   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[place]) 09/06/2015   Lung cancer ([place]) 11/07/2014   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   HTN (hypertension) 04/29/2014   Meningioma ([place]) 10/25/2013   Low back pain 10/25/2013   Osteoarthritis 08/18/2012   Atypical chest pain 08/14/2011   KERATOSIS 10/09/2010   SCIATICA, RIGHT 10/09/2010   UNSPECIFIED HEARING LOSS 05/21/2010   Hyperlipidemia 05/14/2010    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   RENAL CYST, RIGHT 02/05/2010   Abdominal aortic aneurysm 01/29/2010   LIPOMA 11/17/2009   MICROSCOPIC HEMATURIA 08/11/2008   GERD 07/26/2008   DERMOID CYST 07/25/2008    Immunization History  Administered Date(s) Administered   Fluad Quad(high Dose 65+) 07/23/2019, 09/04/2020, 07/13/2021   Influenza Split 08/02/2011, 08/18/2012   Influenza Whole 08/17/2008   Influenza, High Dose Seasonal PF 08/09/2015, 09/30/2016, 07/30/2017, 08/07/2018   Influenza,inj,Quad PF,6+ Mos 07/27/2013, 08/09/2014   PFIZER Comirnaty(Gray Top)Covid-19 Tri-Sucrose Vaccine 02/16/2021   PFIZER(Purple Top)SARS-COV-2 Vaccination 01/28/2020, 02/21/2020, 09/04/2020   Pfizer Covid-19 Vaccine Bivalent Booster 75yrs & up 08/03/2021   Pneumococcal Conjugate-13 08/09/2014   Pneumococcal Polysaccharide-23 05/14/2010   Td 07/25/2008    Conditions to be addressed/monitored: Atrial Fibrillation, CHF, HTN, HLD, COPD, and Gout  Care Plan : General Pharmacy (Adult)  Updates made by Cherre Robins, RPH-CPP since 11/29/2021 12:00 AM     Problem: Management of chronic medicaiton conditions and medication: Atrial Fibrillation, HTN, HLD, type 2 DM, Pulmonary Disease and BPH; seizures; h/o lung cancer and meningioma; GERD; aortic aneurysm   Priority: High  Onset Date: 02/08/2021  Note:   Current Barriers:  Does not adhere to prescribed medication regimen Management of chronic medical conditions and medication management Unable to afford medication therapy - improved  Pharmacist Clinical Goal(s):  Over the next 180 days, patient will achieve adherence to monitoring guidelines and medication adherence to achieve therapeutic efficacy contact provider office for questions/concerns as evidenced notation of same in electronic health record through collaboration with PharmD and provider.   Interventions: 1:1 collaboration with [name], NP regarding development and update of  comprehensive plan of care as evidenced by provider attestation and co-signature Inter-disciplinary care team collaboration (see longitudinal plan of care) Comprehensive medication review performed; medication list updated in electronic medical record  Diabetes: Controlled; A1c goal < 7.0% Last A1c was 5.8 % (07/13/2021) - has not taken metformin in over 3 months.  Current treatment:  none Current glucose readings: does not check BG at home Current meal patterns: eating a little better since CABG 05/2021. He has been out of work so eating out less.  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  Interventions:  (addressed at previous visit)  Educated on limiting high CHO foods; recommended increase non starchy vegetables Counseled on A1c goal.   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 Also has not been taking potassium supplement 11/29/2021 - he reports today that he is taking furosemide some day when he is not working. This past week took Saturday, Sunday and Monday night. Reports that breathing and abdominal fullness has improved. He reports his weight is down about 9 lbs. Per patient (last week 180 lbs and today 171lbs)  Patient did mention that he has had increase difficulty urinating 2 days this past week (Monday 11/26/2021 and Wednesday 11/28/2021). Does not appear to be directly related to days he took diuretic. Patient endorses that he is taking tamsulosin 0.4mg  every day.  Patient checks BP at home infrequently -  no readings provided EF was 60 to 65% 04/2021 Denies hypotensive/hypertensive symptoms Denies LEE  Interventions: (addressed at previous visit) Educated on BP goals Continue to take furosemide and potassium daily (at least on days he will be at home) Contact office if you have worsening abdominal fullness or any of these other symptoms. Weight gain, shortness of breath, abdominal fullness, swelling in legs or abdomen, Fatigue and weakness, changes in ability to perform usual activities, persistent cough or wheezing with white or pink blood-tinged mucus, nausea and lack of appetite Weigh daily - report weight gain of more than 3 lbs in 24 hours or 5 lbs in 1 week Will forward message to PCP regarding urinary hesitancy   Hyperlipidemia / CAD (LDL < 70) Controlled Recent CABG x 3 vessels 05/08/2021. Patient continues to have issues with plural effusion s/p CABG.  Followed by cardio and cardiothoracic surgery team.  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 Recommended try to increase intake of non starchy vegetables Recommended continue current therapy Consider recheck lipids with next labs Reviewed refill history and assess adherence - patient filled atorvastatin #90 on 08/07/2021 and 11/09/2021  Chronic Obstructive Pulmonary Disease: Controlled COPD monitored by Dr Shearon Stalls - first visit 10/2021 Current treatment:  albuterol inhaler or nebulizer solution use as needed for shortness of breath or wheezing (rescue inhaler) Breztri - inhaler two puffs into lungs twice a day (newly prescribed this week by pulmonologist but has not started due to cost) [place] and Me patient assistance program  approved 11/13/2021. Called patient assistance program. Medication has shipped yet 11/23/2021 - tracking number [phone]) Expected deliver by Monday, January 30th by 7pm. For now patient continues to use Advair inhaler twice a day until [place] arrives.  Patient confirms he did receive nebulizer GOLD Classification: 2 (FEV1 50-79%) Most recent Pulmonary Function Testing: 05/19/2017 - spirometry FVC 2.84 (73%) FEV1 1.74 (62%) FEV1/FVC 61% Current COPD Classification:  B (CATscore >10, <2 exacerbations/yr)  Interventions:  Start [place] as soon as received from [facility] and Me patient assistance program  (Continue to take Advair you receive Breztri) Should arrive 12/03/2021  Atrial Fibrillation: NSR since last hospitalization but did recently change from diltiazem to amiodarone due to being in afib prior to cardiac cath Current rate/rhythm controller:  amiodarone $RemoveBef'200mg'sArEMnHgAF$  daily Anticoagulant treatment:  Xarelto $Remove'20mg'HnswdfH$  daily with evening meal CHADS2VASc score: 5  08/22/2021: Coordinated with patient assistance program to verify he was approved for Xarelto assistance through 11/03/2021. Patient has been mailed card to use at pharmacy for free Xarelto. I was also faxed a copy and called [facility] pharmacy to give the information to fill Xarelto now.  Will continue to follow in 2023. Cannot apply for Xarelto patient assistance program until reaches coverage gap. Interventions:  Recommended continue current regimen Patient to have LFTs and TSH monitored by cardiologist regularly due to amiodarone therapy  GERD:  Patient is currently controlled on the following medications:  Omeprazole $RemoveBef'40mg'jkbsBkexqj$  daily Patient has failed these meds in past: None noted  Triggers: laying down after eating Interventions: (addressed at previous visit)  Recommend continue current medications    Health Maintenance:  Reviewed vaccination history and discussed benefits of Shingrix and Tdap vaccines Has received  both annual flu and COVID booster.  Patient to get the following vaccines in 2023 - Shingrix and Tdap vaccines  Patient Goals/Self-Care Activities Over the next 180 days, patient will:  take medications as prescribed and collaborate with provider on medication access solutions Follow up with cardiologist and cardiothoracic team Continue cardiac rehab program  Follow Up Plan: Telephone follow up appointment with care management team member scheduled for:  1 month                 Medication Assistance:  Breztri obtained through [facility] and Me medication assistance program.  Enrollment ends 11/03/2021 (approval date 11/2020) . First shipment sent 11/23/2021 - per tracking number should be received by 12/03/2021 at 7pm   Patient's preferred pharmacy is:  [facility] [address] [place] [address] Phone: [phone] Fax: [facility] [address] [place] [address] Phone: [phone] Fax: [phone]   Follow Up:  Patient agrees to Care Plan and Follow-up.  Plan: Telephone follow up appointment with care management team member scheduled for:  1 month with clinical pharmacist .   Cherre Robins, PharmD Clinical Pharmacist [place] [facility]

## 2021-11-29 NOTE — Patient Instructions (Signed)
Mr. Schoch It was a pleasure speaking with you today.  I have attached a summary of our visit today and information about your health goals.   Our next appointment is by telephone on December 20, 2021 at 10:30am  Please call the care guide team at [phone] if you need to cancel or reschedule your appointment.   If you have any questions or concerns, please feel free to contact me either at the phone number below or with a MyChart message.    Cherre Robins, PharmD Clinical Pharmacist [place] [phone] (direct line)  [phone] (main office number)  Patient Goals/Self-Care Activities Over the next 180 days, patient will:  Take medications as prescribed and collaborate with provider on medication access solutions Follow up with cardiologist and cardiothoracic teams Continue to take furosemide and potassium supplement Check weight daily Discussed signs and symptoms of CHF exacerbation - weight gain, SOB, abdominal fullness, swelling in legs or abdomen, Fatigue and weakness, changes in ability to perform usual activities, persistent cough or wheezing with white or pink blood-tinged mucus, nausea and lack of appetite. Notify medical office if you experience these symptoms.   Patient verbalizes understanding of instructions and care plan provided today and agrees to view in [place]. Active MyChart status confirmed with patient.

## 2021-11-30 ENCOUNTER — Telehealth: Payer: Self-pay | Admitting: Family

## 2021-11-30 DIAGNOSIS — R350 Frequency of micturition: Secondary | ICD-10-CM

## 2021-11-30 NOTE — Telephone Encounter (Signed)
Please contact pt and offer to send him to [facility] for his urinary difficulties.  Last time I referred him he did not schedule. If he is agreeable, please place referral for urinary problem.

## 2021-11-30 NOTE — Telephone Encounter (Signed)
Called pt and lvm to return call.  

## 2021-12-03 NOTE — Telephone Encounter (Signed)
Patient agrees with referral.

## 2021-12-04 DIAGNOSIS — J449 Chronic obstructive pulmonary disease, unspecified: Secondary | ICD-10-CM

## 2021-12-04 DIAGNOSIS — I25110 Atherosclerotic heart disease of native coronary artery with unstable angina pectoris: Secondary | ICD-10-CM | POA: Diagnosis not present

## 2021-12-04 DIAGNOSIS — I480 Paroxysmal atrial fibrillation: Secondary | ICD-10-CM

## 2021-12-04 DIAGNOSIS — E119 Type 2 diabetes mellitus without complications: Secondary | ICD-10-CM

## 2021-12-04 DIAGNOSIS — E785 Hyperlipidemia, unspecified: Secondary | ICD-10-CM | POA: Diagnosis not present

## 2021-12-17 ENCOUNTER — Telehealth: Payer: Self-pay | Admitting: Family

## 2021-12-20 ENCOUNTER — Telehealth: Payer: Medicare Other

## 2021-12-24 ENCOUNTER — Ambulatory Visit (INDEPENDENT_AMBULATORY_CARE_PROVIDER_SITE_OTHER): Payer: Medicare Other | Admitting: Pharmacist

## 2021-12-24 DIAGNOSIS — I25110 Atherosclerotic heart disease of native coronary artery with unstable angina pectoris: Secondary | ICD-10-CM

## 2021-12-24 DIAGNOSIS — J441 Chronic obstructive pulmonary disease with (acute) exacerbation: Secondary | ICD-10-CM

## 2021-12-24 NOTE — Chronic Care Management (AMB) (Signed)
Chronic Care Management Pharmacy Note  12/24/2021 Name:  Tahjae Clausing MRN:  [id number] DOB:  28-Jun-1941  Summary: Patient has received first deliver of Breztri inhalers from patient assistance program. Education provided about inhaler technique for [employer]. Reminded patient of instructions to use 2 puffs twice a day and to remember to rinse mouth after each use.  Provided patient with phone number for [facility] [phone] Patient reports edema improved with furosemide every other day.  Subjective: Kaydin Karbowski is an 81 y.o. year old male who is a primary patient of [name], NP.  The CCM team was consulted for assistance with disease management and care coordination needs.    Collaboration with patient   for  follow up  in response to provider referral for pharmacy case management and/or care coordination services.   Consent to Services:  The patient was given information about Chronic Care Management services, agreed to services, and gave verbal consent prior to initiation of services.  Please see initial visit note for detailed documentation.   Patient Care Team: [name], NP as PCP - General Nahser, Wonda Cheng, MD as PCP - Cardiology (Cardiology) Elsie Stain, MD as Attending Physician (Pulmonary Disease) Virgina Evener, OD (Optometry) Cherre Robins, RPH-CPP (Pharmacist) Sharmon Revere as Physician Assistant (Cardiology) Spero Geralds, MD as Consulting Physician (Pulmonary Disease)  Recent office visits: 10/12/2021 - Int Med Inda Castle NP) F/U COPD exacerbation. Referred to pulmonology and cardiology 10/03/2021 -  Int Med Inda Castle NP) COPD exacerbation. Neb treatment and solumedrol in office. Prescribed prednisone 10mg  - take 40mg  for 2 days 30mg  for 2 days, 20mg  for 2 days then 10mg  for 2 days 07/13/2021 - PCP Inda Castle, NP) F/U chronic conditions. Removed furosemide from med list (patient preferance) and tramadols (completed  course)  Recent consult visits: 10/17/2021 - Pulmonology (Dr Shearon Stalls) new patient evaluation of dyspnea, chest tightness and wheezing. Stopped Advair. Started [employer]. also prescribed home nebulizer machine to use albuterol as needed.  10/17/2021 - Cardio (Dr Acie Fredrickson) F/U afib. Ordered ECHO due to dyspnea. No medication changes noted.  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  Continued Xarelto for Afib; Plan to stop aspirin 81mg  3 months from CABG (October 2022 - but pt to check with surgeon). Added furosemide 20mg  daily and potassium 10 mEq daily. Recheck BMP in 2 weeks.  07/11/2021 - Cardiothoracic surgery Harriet Pho, [facility]) F/u CABG with complecation of plural effusion and left thoracenteisi 05/18/2021 and 06/07/2021. Notes suggest restarting furosemide for LEE but not added back to med list.  06/28/2021 - Cardiothoracic Surgery Harriet Pho, [facility]) F/U CABG with complication of plural effusion. Xray orderd. Patient sent for thoracentesis. Recommended continue furosemide 40mg  daily and potassium 20 mEq daily. 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 Sondra Come. Currently on observation. Repeat CT scan of the chest performed recently showed no concerning findings for disease recurrence or metastasis.  The pleural effusion is secondary to his recent cardiac surgery. Recommended cont observation with repeat chest CT in 1 year 06/11/2021 - Cardio ([place], [facility]) Left plural effusion re-accumulation per xray. Recommended continue furosemide 40mg  daily and potassium 20 mEq daily. Recheck in 2 weeks.  06/05/2021 - Cardio [place], [facility]) Hospital F/U S/P CABG. Continue Aspirin until 3 months post CABG, Stopped metoprolol due to lightheadedness and low HR. Started  furosemide  40mg  daily for 7 days and as needed thereafter for weight gain > 3 lbs and potassium 20 mEq daily for 7 days and as needed when taking furosemide.  BMET checked and will be repeated in 1 week.  06/04/2021 - Cardiothoracic surgery 08/04/2021, [facility]) F/U CABG and left plural effusion. Need thoracentesis on 05/18/2021 while in hospital. Unable to give diuretic due to low BP. Ordered 05/20/2021 guided thoracentesis with repeat xray in 1 week.     Hospital visits: None in last 6 months   Objective:  Lab Results  Component Value Date   CREATININE 0.96 08/03/2021   CREATININE 0.86 06/14/2021   CREATININE 0.86 06/05/2021    Lab Results  Component Value Date   HGBA1C 5.8 07/13/2021   Last diabetic Eye exam: No results found for: HMDIABEYEEXA  Last diabetic Foot exam: No results found for: HMDIABFOOTEX      Component Value Date/Time   CHOL 140 07/13/2021 1336   TRIG 72.0 07/13/2021 1336   HDL 67.00 07/13/2021 1336   CHOLHDL 2 07/13/2021 1336   VLDL 14.4 07/13/2021 1336   LDLCALC 58 07/13/2021 1336   LDLCALC 62 04/11/2021 1421   LDLDIRECT 142.2 12/26/2008 1032    Hepatic Function Latest Ref Rng & Units 06/14/2021 05/04/2021 12/08/2020  Total Protein 6.5 - 8.1 g/dL 5.9(L) 5.9(L) 6.1  Albumin 3.5 - 5.0 g/dL 2.8(L) 3.3(L) -  AST 15 - 41 U/L 11(L) 16 13  ALT 0 - 44 U/L 13 13 12   Alk Phosphatase 38 - 126 U/L 103 80 -  Total Bilirubin 0.3 - 1.2 mg/dL 0.4 0.5 0.4  Bilirubin, Direct 0.0 - 0.3 mg/dL - - -    Lab Results  Component Value Date/Time   TSH 4.26 08/03/2021 08:25 AM   TSH 1.31 11/08/2019 02:55 PM    CBC Latest Ref Rng & Units 06/14/2021 05/17/2021 05/15/2021  WBC 4.0 - 10.5 K/uL 6.8 9.3 9.4  Hemoglobin 13.0 - 17.0 g/dL 10.4(L) 9.5(L) 9.5(L)  Hematocrit 39.0 - 52.0 % 32.4(L) 29.9(L) 29.7(L)  Platelets 150 - 400 K/uL 247 302 268    No results found for: VD25OH  Clinical ASCVD: Yes  The ASCVD Risk score (Arnett DK, et al., 2019) failed to calculate for the following reasons:   The 2019  ASCVD risk score is only valid for ages 32 to 57    Other: CHADS2VASc = 5   Social History   Tobacco Use  Smoking Status Former   Packs/day: 1.00   Years: 57.00   Pack years: 57.00   Types: Cigarettes, Cigars   Quit date: 08/08/2015   Years since quitting: 6.3  Smokeless Tobacco Former   Types: Chew   Quit date: 11/04/1958  Tobacco Comments   Will smoke cigar every once in awhile   BP Readings from Last 3 Encounters:  10/17/21 120/60  10/17/21 130/64  10/12/21 111/60   Pulse Readings from Last 3 Encounters:  10/17/21 62  10/17/21 63  10/12/21 60   Wt Readings from Last 3 Encounters:  10/17/21 173 lb (78.5 kg)  10/17/21 172 lb 9.6 oz (78.3 kg)  10/12/21 172 lb 6.4 oz (78.2 kg)    Assessment: Review of patient past medical history, allergies, medications, health status, including review of consultants reports, laboratory and other test data, was performed as part of comprehensive evaluation and provision of chronic care management services.   SDOH:  (Social Determinants of Health) assessments and interventions performed:     CCM Care Plan  Allergies  Allergen  Reactions   Iodine Other (See Comments)    neck swells   Iohexol Swelling    Neck and gland swelling per patient.    Medications Reviewed Today     Reviewed by Cherre Robins, RPH-CPP (Pharmacist) on 12/24/21 at [place] List Status: <None>   Medication Order Taking? Sig Documenting Provider Last Dose Status Informant  acetaminophen (TYLENOL) 500 MG tablet [id number] Yes Take 1-2 tablets (500-1,000 mg total) by mouth every 6 (six) hours as needed. Lars Pinks M, PA-C Taking Active   albuterol (PROVENTIL) (2.5 MG/3ML) 0.083% nebulizer solution [id number] Yes Take 3 mLs (2.5 mg total) by nebulization every 6 (six) hours as needed for wheezing or shortness of breath. Spero Geralds, MD Taking Active   albuterol (VENTOLIN HFA) 108 [phone] Base) MCG/ACT inhaler [id number] Yes Inhale 2 puffs into the lungs every  6 (six) hours as needed for wheezing or shortness of breath. [name], NP Taking Active   amiodarone (PACERONE) 200 MG tablet [id number] Yes Take 1 tablet (200 mg total) by mouth daily. Nahser, Wonda Cheng, MD Taking Active   aspirin EC 81 MG tablet [id number] Yes Take 1 tablet (81 mg total) by mouth in the morning. Swallow whole. Nani Skillern, PA-C Taking Active   atorvastatin (LIPITOR) 80 MG tablet [id number] Yes Take 1 tablet (80 mg total) by mouth daily. Nahser, Wonda Cheng, MD Taking Active   Budeson-Glycopyrrol-Formoterol (BREZTRI AEROSPHERE) 160-9-4.8 MCG/ACT Hollie Salk [id number] Yes Inhale 2 puffs into the lungs in the morning and at bedtime. Spero Geralds, MD Taking Active            Med Note [facility], Adria Dill Nov 22, 2021 10:28 AM) [place] and [place] patient [place] program 11/13/2021 thru 11/03/2022  furosemide (LASIX) 20 MG tablet [id number] Yes Take 1 tablet (20 mg total) by mouth daily.  Patient taking differently: Take 20 mg by mouth every other day.   Richardson Dopp T, [place] Taking Active     Discontinued 05/08/21 1058 Multiple Vitamin (MULTIVITAMIN WITH MINERALS) TABS tablet [id number] Yes Take 1 tablet by mouth daily in the afternoon. [provider] Taking Active   omeprazole (PRILOSEC) 40 MG capsule [id number] Yes Take 1 capsule (40 mg total) by mouth daily. [name], NP Taking Active   potassium chloride (KLOR-CON) 10 MEQ tablet [id number] Yes Take 1 tablet (10 mEq total) by mouth daily. Richardson Dopp T, PA-C Taking Active   rivaroxaban (XARELTO) 20 MG TABS tablet [id number] Yes TAKE 1 TABLET BY MOUTH DAILY WITH SUPPER Nahser, Wonda Cheng, MD Taking Active   tamsulosin (FLOMAX) 0.4 MG CAPS capsule [id number] Yes TAKE 1 CAPSULE (0.4 MG TOTAL) BY MOUTH DAILY. [name], NP Taking Active             Patient Active Problem List   Diagnosis Date Noted   COPD exacerbation ([place]) 10/03/2021   Persistent atrial fibrillation ([place]) 05/28/2021    Secondary hypercoagulable state ([place]) 05/28/2021   Malnutrition of moderate degree 05/15/2021   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   Coronary artery disease 05/08/2021   COVID-19 virus infection 04/23/2021   Unstable angina ([place]) 04/20/2021   Erectile dysfunction 04/11/2021   Head trauma 03/07/2021   Left leg pain 04/07/2020   Benign prostatic hyperplasia with nocturia 03/08/2020   Seizures ([place]) 02/18/2020   PAF (paroxysmal atrial fibrillation) ([place]) 08/15/2017   Diabetes type 2, controlled ([place]) 07/31/2017   COPD GOLD II  04/03/2017   Primary malignant neoplasm of bronchus of left  lower lobe ([name]) 09/06/2015   Lung cancer ([place]) 11/07/2014   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   HTN (hypertension) 04/29/2014   Meningioma ([place]) 10/25/2013   Low back pain 10/25/2013   Osteoarthritis 08/18/2012   Atypical chest pain 08/14/2011   KERATOSIS 10/09/2010   SCIATICA, RIGHT 10/09/2010   UNSPECIFIED HEARING LOSS 05/21/2010   Hyperlipidemia 05/14/2010   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   RENAL CYST, RIGHT 02/05/2010   Abdominal aortic aneurysm 01/29/2010   LIPOMA 11/17/2009   MICROSCOPIC HEMATURIA 08/11/2008   GERD 07/26/2008   DERMOID CYST 07/25/2008    Immunization History  Administered Date(s) Administered   Fluad Quad(high Dose 65+) 07/23/2019, 09/04/2020, 07/13/2021   Influenza Split 08/02/2011, 08/18/2012   Influenza Whole 08/17/2008   Influenza, High Dose Seasonal PF 08/09/2015, 09/30/2016, 07/30/2017, 08/07/2018   Influenza,inj,Quad PF,6+ Mos 07/27/2013, 08/09/2014   PFIZER Comirnaty(Gray Top)Covid-19 Tri-Sucrose Vaccine 02/16/2021   PFIZER(Purple Top)SARS-COV-2 Vaccination 01/28/2020, 02/21/2020, 09/04/2020   Pfizer Covid-19 Vaccine Bivalent Booster 51yrs & up 08/03/2021   Pneumococcal Conjugate-13 08/09/2014   Pneumococcal Polysaccharide-23 05/14/2010   Td 07/25/2008    Conditions to be addressed/monitored: Atrial Fibrillation, CHF, HTN, HLD, COPD, and Gout  Care Plan : General  Pharmacy (Adult)  Updates made by Cherre Robins, RPH-CPP since 12/24/2021 12:00 AM     Problem: Management of chronic medicaiton conditions and medication: Atrial Fibrillation, HTN, HLD, type 2 DM, Pulmonary Disease and BPH; seizures; h/o lung cancer and meningioma; GERD; aortic aneurysm   Priority: High  Onset Date: 02/08/2021  Note:   Current Barriers:  Does not adhere to prescribed medication regimen Management of chronic medical conditions and medication management Unable to afford medication therapy - improved  Pharmacist Clinical Goal(s):  Over the next 180 days, patient will achieve adherence to monitoring guidelines and medication adherence to achieve therapeutic efficacy contact provider office for questions/concerns as evidenced notation of same in electronic health record through collaboration with PharmD and provider.   Interventions: 1:1 collaboration with [name], NP regarding development and update of comprehensive plan of care as evidenced by provider attestation and co-signature Inter-disciplinary care team collaboration (see longitudinal plan of care) Comprehensive medication review performed; medication list updated in electronic medical record  Diabetes: Controlled; A1c goal < 7.0% Last A1c was 5.8 % (07/13/2021) - has not taken metformin in over 3 months.  Current treatment:  none Current glucose readings: does not check BG at home Current meal patterns: eating a little better since CABG 05/2021. He has been out of work so eating out less. 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  Interventions:  (addressed at previous visit)  Educated on limiting high CHO foods; recommended increase non starchy vegetables Counseled on A1c goal.   Hypertension (BP goal <130/80) / CHF with preserved EF Controlled  Cr[place] treatment:  Furosemide $RemoveBef'20mg'BlSeSMrIJz$  daily (lowered from $RemoveBefor'40mg'WXSBWZspaWNi$ ) Potassium chloride 10 mEq daily (lowered from 20 mEq)  Previously took  diltiazem but stopped during hospitalization when amiodarone was started; tried metoprolol - bradycardia At previous visit 11/22/2021 patient reported was back at works as a Administrator 40 hours per week and he had not been taking furosemide because it causes him to have to make frequent bathroom stops. Also has not been taking potassium supplement 11/29/2021 - he reports today that he is taking furosemide some day when he is not working. This past week took Saturday, Sunday and Monday night. Reports that breathing and abdominal fullness has improved. He reports his weight is down about 9 lbs.  Per patient (last week 180 lbs and today 171lbs)  Patient did mention that he has had increase difficulty urinating 2 days this past week (Monday 11/26/2021 and Wednesday 11/28/2021). Does not appear to be directly related to days he took diuretic. Patient endorses that he is taking tamsulosin 0.4mg  every day.  Patient checks BP at home infrequently - no readings provided EF was 60 to 65% 04/2021 Denies hypotensive/hypertensive symptoms Denies LEE  Interventions: (addressed at previous visit) Educated on BP goals Continue to take furosemide and potassium daily (at least on days he will be at home) Contact office if you have worsening abdominal fullness or any of these other symptoms. Weight gain, shortness of breath, abdominal fullness, swelling in legs or abdomen, Fatigue and weakness, changes in ability to perform usual activities, persistent cough or wheezing with white or pink blood-tinged mucus, nausea and lack of appetite Weigh daily - report weight gain of more than 3 lbs in 24 hours or 5 lbs in 1 week Will forward message to PCP regarding urinary hesitancy   Hyperlipidemia / CAD (LDL < 70) Controlled Recent CABG x 3 vessels 05/08/2021. Patient continues to have issues with plural effusion s/p CABG.  Followed by cardio and cardiothoracic surgery team.  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 Recommended try to increase intake of non starchy vegetables Recommended continue current therapy Consider recheck lipids with next labs Reviewed refill history and assess adherence - patient filled atorvastatin #90 on 08/07/2021 and 11/09/2021  Chronic Obstructive Pulmonary Disease: Controlled COPD monitored by Dr Shearon Stalls - first visit 10/2021 Current treatment:  albuterol inhaler or nebulizer solution use as needed for shortness of breath or wheezing (rescue inhaler) Breztri - inhaler two puffs into lungs twice a day  [place] approved 11/13/2021. Called patient assistance program. Medication has shipped 11/23/2021 - tracking number [id number] [phone]) Expected deliver by Monday, January 30th by 7pm. For now patient continues to use Advair inhaler twice a day until [place] arrives.  Patient endorses that he has received Breztri inhalers form [place] and [place] and has started to use.  Patient confirms he did receive nebulizer but not sure that he wants to keep it since he wife also has one he can use.  GOLD Classification: 2 (FEV1 50-79%) Most recent Pulmonary Function Testing: 05/19/2017 - spirometry FVC 2.84 (73%) FEV1 1.74 (62%) FEV1/FVC 61% Current COPD Classification:  B (CATscore >10, <2 exacerbations/yr)  Interventions:  Education provided about inhaler technique for [employer]. Reminded patient of instructions to use 2 puffs twice a day and to remember to rinse mouth after each use.  Provided patient with phone number for [facility] [phone]  Atrial Fibrillation: NSR since last hospitalization but did recently change from diltiazem to amiodarone due to being in afib prior to cardiac  cath Current rate/rhythm controller:  amiodarone 200mg  daily Anticoagulant treatment:  Xarelto 20mg  daily with evening meal CHADS2VASc score: 5  08/22/2021: Coordinated with patient assistance program to verify he was approved for Xarelto assistance through 11/03/2021. Patient has been mailed card to use at pharmacy for free Xarelto. I was also faxed a copy and called [facility] pharmacy to give the information to fill Xarelto now.  Will continue to follow in 2023. Cannot apply for Xarelto  patient assistance program until reaches coverage gap. Interventions:  Recommended continue current regimen Patient to have LFTs and TSH monitored by cardiologist regularly due to amiodarone therapy  GERD:  Patient is currently controlled on the following medications:  Omeprazole $RemoveBef'40mg'XRLAIwlRTL$  daily Patient has failed these meds in past: None noted  Triggers: laying down after eating Interventions: (addressed at previous visit)  Recommend continue current medications    Health Maintenance:  Reviewed vaccination history and discussed benefits of Shingrix and Tdap vaccines Has received both annual flu and COVID booster.  Patient to get the following vaccines in 2023 - Shingrix and Tdap vaccines  Patient Goals/Self-Care Activities Over the next 180 days, patient will:  take medications as prescribed and collaborate with provider on medication access solutions Take Breztri - inhaler 2 puffs into lungs twice a day. Remember to rinse mouth after each use.  Follow up with cardiologist and cardiothoracic team Continue cardiac rehab program  Follow Up Plan: Telephone follow up appointment with care management team member scheduled for:  1 month                 Medication Assistance:  Breztri obtained through [facility] and Me medication assistance program.  Enrollment ends 11/03/2021 (approval date 11/2020) . First shipment sent 11/23/2021 - per tracking number should be received by 12/03/2021 at  7pm   Patient's preferred pharmacy is:  [facility] [address], [place] [address] Phone: [phone] Fax: [facility] [address] [place] [address] Phone: [phone] Fax: [phone]   Follow Up:  Patient agrees to Care Plan and Follow-up.  Plan: Telephone follow up appointment with care management team member scheduled for:  1 month with clinical pharmacist .   Cherre Robins, PharmD Clinical Pharmacist [facility]

## 2021-12-24 NOTE — Patient Instructions (Signed)
Robert Bates It was a pleasure speaking with you today.  Below is a summary of our visit today and information about your health goals.   Patient Goals/Self-Care Activities Over the next 90 days, patient will:  Take medications as prescribed and collaborate with provider on medication access solutions Follow up with cardiologist and cardiothoracic teams Continue to take furosemide and potassium supplement Check weight daily Discussed signs and symptoms of CHF exacerbation - weight gain, SOB, abdominal fullness, swelling in legs or abdomen, Fatigue and weakness, changes in ability to perform usual activities, persistent cough or wheezing with white or pink blood-tinged mucus, nausea and lack of appetite. Notify medical office if you experience these symptoms Take Breztri - inhale 2 puffs into lungs twice a day. Remember to rinse mouth after each use.    If you have any questions or concerns, please feel free to contact me either at the phone number below or with a MyChart message.   Keep up the good work!  Cherre Robins, PharmD Clinical Pharmacist [place] [phone] (direct line)  [phone] (main office number)   Patient verbalizes understanding of instructions and care plan provided today and agrees to view in [place]. Active MyChart status confirmed with patient.

## 2021-12-26 DIAGNOSIS — J449 Chronic obstructive pulmonary disease, unspecified: Secondary | ICD-10-CM | POA: Diagnosis not present

## 2022-01-01 DIAGNOSIS — J441 Chronic obstructive pulmonary disease with (acute) exacerbation: Secondary | ICD-10-CM

## 2022-01-01 DIAGNOSIS — I25110 Atherosclerotic heart disease of native coronary artery with unstable angina pectoris: Secondary | ICD-10-CM

## 2022-01-02 ENCOUNTER — Ambulatory Visit: Payer: Medicare Other | Admitting: Physician Assistant

## 2022-01-09 ENCOUNTER — Ambulatory Visit: Payer: Medicare Other | Admitting: Family

## 2022-01-11 ENCOUNTER — Ambulatory Visit: Payer: Medicare Other | Admitting: Family

## 2022-01-11 ENCOUNTER — Other Ambulatory Visit: Payer: Self-pay

## 2022-01-11 ENCOUNTER — Other Ambulatory Visit (HOSPITAL_BASED_OUTPATIENT_CLINIC_OR_DEPARTMENT_OTHER): Payer: Self-pay

## 2022-01-11 ENCOUNTER — Ambulatory Visit: Payer: Medicare Other | Admitting: Internal Medicine

## 2022-01-11 ENCOUNTER — Encounter: Payer: Self-pay | Admitting: Internal Medicine

## 2022-01-11 ENCOUNTER — Ambulatory Visit (INDEPENDENT_AMBULATORY_CARE_PROVIDER_SITE_OTHER): Payer: Medicare Other | Admitting: Pharmacist

## 2022-01-11 VITALS — BP 116/68 | HR 65 | Temp 98.3°F | Ht 67.0 in | Wt 174.6 lb

## 2022-01-11 DIAGNOSIS — J441 Chronic obstructive pulmonary disease with (acute) exacerbation: Secondary | ICD-10-CM

## 2022-01-11 DIAGNOSIS — I480 Paroxysmal atrial fibrillation: Secondary | ICD-10-CM

## 2022-01-11 MED ORDER — PREDNISONE 20 MG PO TABS
40.0000 mg | ORAL_TABLET | Freq: Every day | ORAL | 0 refills | Status: DC
  Filled 2022-01-11: qty 10, 5d supply, fill #0

## 2022-01-11 NOTE — Progress Notes (Signed)
? ?      ?Robert Bates    [id number]    [birth year] ? ?Primary Care Physician:O'Sullivan, Lenna Sciara, NP ?Date of Appointment: 01/11/2022 ?Established Patient Visit ? ?Chief complaint:   ?Chief Complaint  ?Patient presents with  ? Follow-up  ?  Pt states he is still wheezing.  ? ? ? ?HPI: ?Robert Bates is a 81 y.o.  man with COPD Gold stage 2 FEV1 62% of predicted, lung cancer s/p curative radiation in 2016 and CAD s/p CABG.  ? ?Interval Updates: ?Here for follow up for COPD. At last visit switched from advair to breztri. Doesn't notice much of a difference. Feeling wheezy the past few days. No fevers or chills. No cough.  ? ?He says he has quit smoking cigarettes but only smokes a cigar these days.  ? ?He did 4-5 weeks of cardiac rehab after his bypass had to stop due to knee pain and getting back to work.  ? ? ?I have reviewed the patient's family social and past medical history and updated as appropriate.  ? ?Past Medical History:  ?Diagnosis Date  ? Anxiety   ? Arthritis   ? Cancer ([place]) 2016  ? 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  ? COPD (chronic obstructive pulmonary disease) ([place])   ? Coronary artery disease   ? Diabetes type 2, controlled ([place]) 07/31/2017  ? Dyspnea   ? Dysrhythmia   ? a fib  ? GERD (gastroesophageal reflux disease)   ? Hematuria   ? refuses work up or referral - understands risks of morbidity / mortality - 11/2008, 12/2008  ? History of hiatal hernia   ? History of kidney stones   ? Hyperlipemia   ? Meningioma ([place]) 10/25/2013  ? Follows with Dr. [name].   ? Peripheral vascular disease ([place])   ? Abdominal Aortic Aneursym  ? Pneumonia   ? as a child  ? Radiation 09/18/15-10/25/15  ? left lower lobe 70.2 Gy  ? Seizures ([place]) 02/18/2020  ? Tobacco abuse   ? ? ?Past Surgical History:  ?Procedure Laterality Date  ? CHOLECYSTECTOMY N/A 07/23/2017  ? Procedure: LAPAROSCOPIC CHOLECYSTECTOMY;  Surgeon: Kinsinger, Arta Bruce,  MD;  Location: [facility];  Service: General;  Laterality: N/A;  ? CLIPPING OF ATRIAL APPENDAGE Left 05/08/2021  ? Procedure: CLIPPING OF ATRIAL APPENDAGE USING 22 ATRICLIP;  Surgeon: Wonda Olds, MD;  Location: [facility];  Service: Open Heart Surgery;  Laterality: Left;  ? COLONOSCOPY    ? CORONARY ARTERY BYPASS GRAFT N/A 05/08/2021  ? Procedure: CORONARY ARTERY BYPASS GRAFTING (CABG)X 3 USING LEFT INTERNAL MAMMARY ARTERY AND RIGHT GREATER SAPEHNOUS VEIN;  Surgeon: Wonda Olds, MD;  Location: [facility];  Service: Open Heart Surgery;  Laterality: N/A;  ? ENDOVEIN HARVEST OF GREATER SAPHENOUS VEIN Right 05/08/2021  ? Procedure: ENDOVEIN HARVEST OF GREATER SAPHENOUS VEIN;  Surgeon: Wonda Olds, MD;  Location: [facility];  Service: Open Heart Surgery;  Laterality: Right;  ? EYE SURGERY Bilateral   ? Cataracts removed w/ lens implant  ? HERNIA REPAIR    ? Left 36 years ago . Right inguinal hernia repair 10-01-17 Dr. Kieth Brightly  ? INGUINAL HERNIA REPAIR Right 10/01/2017  ? Procedure: RIGHT INGUINAL HERNIA REPAIR WITH MESH;  Surgeon: Kinsinger, Arta Bruce, MD;  Location: [facility];  Service: General;  Laterality: Right;  TAP BLOCK  ? INSERTION OF MESH Right 10/01/2017  ? Procedure: INSERTION OF MESH;  Surgeon:  Kinsinger, Arta Bruce, MD;  Location: [facility];  Service: General;  Laterality: Right;  ? IR THORACENTESIS ASP PLEURAL SPACE W/IMG GUIDE  05/18/2021  ? IR THORACENTESIS ASP PLEURAL SPACE W/IMG GUIDE  06/07/2021  ? LEFT HEART CATH AND CORONARY ANGIOGRAPHY N/A 04/20/2021  ? Procedure: LEFT HEART CATH AND CORONARY ANGIOGRAPHY;  Surgeon: Wellington Hampshire, MD;  Location: [facility];  Service: Cardiovascular;  Laterality: N/A;  ? TEE WITHOUT CARDIOVERSION N/A 05/08/2021  ? Procedure: TRANSESOPHAGEAL ECHOCARDIOGRAM (TEE);  Surgeon: Wonda Olds, MD;  Location: [facility];  Service: Open Heart Surgery;  Laterality: N/A;  ? TONSILLECTOMY    ? TONSILLECTOMY    ? VIDEO BRONCHOSCOPY Bilateral 07/26/2015  ? Procedure: VIDEO BRONCHOSCOPY  WITH FLUORO;  Surgeon: [name], MD;  Location: [name];  Service: Cardiopulmonary;  Laterality: Bilateral;  ? VIDEO BRONCHOSCOPY WITH ENDOBRONCHIAL NAVIGATION N/A 08/23/2015  ? Procedure: VIDEO BRONCHOSCOPY WITH ENDOBRONCHIAL NAVIGATION;  Surgeon: Grace Isaac, MD;  Location: [facility];  Service: Thoracic;  Laterality: N/A;  ? VIDEO BRONCHOSCOPY WITH ENDOBRONCHIAL ULTRASOUND N/A 08/23/2015  ? Procedure: VIDEO BRONCHOSCOPY WITH ENDOBRONCHIAL ULTRASOUND;  Surgeon: Grace Isaac, MD;  Location: [facility];  Service: Thoracic;  Laterality: N/A;  ? ? ?Family History  ?Problem Relation Age of Onset  ? Leukemia Father   ? Emphysema Father   ? Learning disabilities Son   ? Atrial fibrillation Son   ? Leukemia Other   ? Stroke Other   ? ? ?Social History  ? ?Occupational History  ? Occupation: Retired  ?  Employer: [employer]  ?  Comment: truck driver  ?  Employer: [employer]  ?Tobacco Use  ? Smoking status: Some Days  ?  Packs/day: 1.00  ?  Years: 57.00  ?  Pack years: 57.00  ?  Types: Cigarettes, Cigars  ?  Last attempt to quit: 08/08/2015  ?  Years since quitting: 6.4  ? Smokeless tobacco: Former  ?  Types: Chew  ?  Quit date: 11/04/1958  ? Tobacco comments:  ?  Will smoke cigar every once in awhile  ?Vaping Use  ? Vaping Use: Former  ?Substance and Sexual Activity  ? Alcohol use: Not Currently  ?  Alcohol/week: 0.0 standard drinks  ? Drug use: No  ? Sexual activity: Not Currently  ? ? ? ?Physical Exam: ?Blood pressure 116/68, pulse 65, temperature 98.3 ?F (36.8 ?C), temperature source Oral, height 5\' 7"  (1.702 m), weight 174 lb 9.6 oz (79.2 kg), SpO2 95 %. ? ?Gen:      No acute distress ?ENT:  no nasal polyps, mucus membranes moist ?Lungs:    diminished. Mild end expiratory wheezes ?CV:         Regular rate and rhythm; no murmurs, rubs, or gallops.  No pedal edema ? ? ?Data Reviewed: ?Imaging: ?I have personally reviewed the chest xray Nov 2022 with chronic small left effusion and scarring. ? ?PFTs: ? ?PFT  Results Latest Ref Rng & Units 05/19/2017 08/25/2015  ?FVC-Pre L 2.84 3.43  ?FVC-Predicted Pre % 73 87  ?FVC-Post L 3.16 3.87  ?FVC-Predicted Post % 81 98  ?Pre FEV1/FVC % % 61 67  ?Post FEV1/FCV % % 61 65  ?FEV1-Pre L 1.74 2.31  ?FEV1-Predicted Pre % 62 81  ?FEV1-Post L 1.93 2.50  ?DLCO uncorrected ml/min/mmHg 16.14 18.35  ?DLCO UNC% % 54 61  ?DLCO corrected ml/min/mmHg 16.10 -  ?DLCO COR %Predicted % 54 -  ?DLVA Predicted % 71 67  ?TLC L 5.61  6.24  ?TLC % Predicted % 84 93  ?RV % Predicted % 108 121  ? ?I have personally reviewed the patient's PFTs and moderately airflow limitation ? ?Labs: ? ?Immunization status: ?Immunization History  ?Administered Date(s) Administered  ? Fluad Quad(high Dose 65+) 07/23/2019, 09/04/2020, 07/13/2021  ? Influenza Split 08/02/2011, 08/18/2012  ? Influenza Whole 08/17/2008  ? Influenza, High Dose Seasonal PF 08/09/2015, 09/30/2016, 07/30/2017, 08/07/2018  ? Influenza,inj,Quad PF,6+ Mos 07/27/2013, 08/09/2014  ? PFIZER Comirnaty(Gray Top)Covid-19 Tri-Sucrose Vaccine 02/16/2021  ? PFIZER(Purple Top)SARS-COV-2 Vaccination 01/28/2020, 02/21/2020, 09/04/2020  ? Pension scheme manager 45yrs & up 08/03/2021  ? Pneumococcal Conjugate-13 08/09/2014  ? Pneumococcal Polysaccharide-23 05/14/2010  ? Td 07/25/2008  ? ? ?External Records Personally Reviewed: echocardiogram with diastolic dysfunction ? ?Assessment:  ?Moderate COPD 62% of predicted with acute exacerbation ?CAD s/p CABG July 2022 ?Left Pleural effusion ?Atrial Fibrillation ?Stage 2B lung cancer of the left side s/p radiation, in remission ? ?Plan/Recommendations: ?Continue breztri. ?Prednisone 40 mg x 5 days for exacerbation. ?Continue using albuterol as needed with more frequent nebs while in exacerbation ? ? ?Return to Care: ?Return in about 3 months (around 04/13/2022). ? ? ?Lenice Llamas, MD ?Pulmonary and Critical Care Medicine ?[place] ?Office:[phone] ? ? ? ? ? ?

## 2022-01-11 NOTE — Chronic Care Management (AMB) (Signed)
Chronic Care Management Pharmacy Note  01/11/2022 Name:  Robert Bates MRN:  [id number] DOB:  1941/08/06  Summary: Patient presented to office requesting a discount care for Xarelto. IN 2023 I helped him get patient assistance program for Xarelto which last thru 11/03/2021. The patient assistance program for Xarelto uses a card for benefits and that is what he was asking for. Explained that patient assistance program for xarelto will not allow patients on Medicare to apply until they reach coverage gap and spend 4% of income out of pocket. Patient understands.  Performed medication cost assessment through [employer]; Patient will likely reach Medicare coverage gap in August 2023. Will continue to monitor as assist with patient assistance program application when able. Also reviewed maintenance and rescue inhalers;   Subjective: Robert Bates is an 81 y.o. year old male who is a primary patient of [name], NP.  The CCM team was consulted for assistance with disease management and care coordination needs.    Collaboration with patient face to face   for  follow up  in response to provider referral for pharmacy case management and/or care coordination services.   Consent to Services:  The patient was given information about Chronic Care Management services, agreed to services, and gave verbal consent prior to initiation of services.  Please see initial visit note for detailed documentation.   Patient Care Team: [name], NP as PCP - General Nahser, Wonda Cheng, MD as PCP - Cardiology (Cardiology) Elsie Stain, MD as Attending Physician (Pulmonary Disease) Virgina Evener, OD (Optometry) Cherre Robins, RPH-CPP (Pharmacist) Sharmon Revere as Physician Assistant (Cardiology) Spero Geralds, MD as Consulting Physician (Pulmonary Disease)  Recent office visits: 10/12/2021 - Int Med Inda Castle NP) F/U COPD exacerbation. Referred to pulmonology and  cardiology 10/03/2021 -  Int Med Inda Castle NP) COPD exacerbation. Neb treatment and solumedrol in office. Prescribed prednisone 10mg  - take 40mg  for 2 days 30mg  for 2 days, 20mg  for 2 days then 10mg  for 2 days 07/13/2021 - PCP Inda Castle, NP) F/U chronic conditions. Removed furosemide from med list (patient preferance) and tramadols (completed course)  Recent consult visits: 01/11/2022 - Pulmonology (Dr Shearon Stalls) Prescribed prednisone 40mg  daily for 5 days.  10/17/2021 - Pulmonology (Dr Shearon Stalls) new patient evaluation of dyspnea, chest tightness and wheezing. Stopped Advair. Started [employer]. also prescribed home nebulizer machine to use albuterol as needed.  10/17/2021 - Cardio (Dr Acie Fredrickson) F/U afib. Ordered ECHO due to dyspnea. No medication changes noted.  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  Continued Xarelto for Afib; Plan to stop aspirin 81mg  3 months from CABG (October 2022 - but pt to check with surgeon). Added furosemide 20mg  daily and potassium 10 mEq daily. Recheck BMP in 2 weeks.  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 Notes suggest restarting furosemide for LEE but not added back to med list.     Hospital visits: None in last 6 months   Objective:  Lab Results  Component Value Date   CREATININE 0.96 08/03/2021   CREATININE 0.86 06/14/2021   CREATININE 0.86 06/05/2021    Lab Results  Component Value Date   HGBA1C 5.8 07/13/2021   Last diabetic Eye exam: No results found for: HMDIABEYEEXA  Last diabetic Foot exam: No results found for: HMDIABFOOTEX      Component Value Date/Time   CHOL 140 07/13/2021 1336   TRIG 72.0 07/13/2021 1336   HDL  67.00 07/13/2021 1336   CHOLHDL 2 07/13/2021 1336   VLDL 14.4 07/13/2021 1336   LDLCALC 58 07/13/2021 1336   LDLCALC 62 04/11/2021 1421   LDLDIRECT 142.2 12/26/2008 1032    Hepatic Function Latest Ref  Rng & Units 06/14/2021 05/04/2021 12/08/2020  Total Protein 6.5 - 8.1 g/dL 5.9(L) 5.9(L) 6.1  Albumin 3.5 - 5.0 g/dL 2.8(L) 3.3(L) -  AST 15 - 41 U/L 11(L) 16 13  ALT 0 - 44 U/L $Remo'13 13 12  'iWdRB$ Alk Phosphatase 38 - 126 U/L 103 80 -  Total Bilirubin 0.3 - 1.2 mg/dL 0.4 0.5 0.4  Bilirubin, Direct 0.0 - 0.3 mg/dL - - -    Lab Results  Component Value Date/Time   TSH 4.26 08/03/2021 08:25 AM   TSH 1.31 11/08/2019 02:55 PM    CBC Latest Ref Rng & Units 06/14/2021 05/17/2021 05/15/2021  WBC 4.0 - 10.5 K/uL 6.8 9.3 9.4  Hemoglobin 13.0 - 17.0 g/dL 10.4(L) 9.5(L) 9.5(L)  Hematocrit 39.0 - 52.0 % 32.4(L) 29.9(L) 29.7(L)  Platelets 150 - 400 K/uL 247 302 268    No results found for: VD25OH  Clinical ASCVD: Yes  The ASCVD Risk score (Arnett DK, et al., 2019) failed to calculate for the following reasons:   The 2019 ASCVD risk score is only valid for ages 17 to 22    Other: CHADS2VASc = 5   Social History   Tobacco Use  Smoking Status Some Days   Packs/day: 1.00   Years: 57.00   Pack years: 57.00   Types: Cigarettes, Cigars   Last attempt to quit: 08/08/2015   Years since quitting: 6.4  Smokeless Tobacco Former   Types: Chew   Quit date: 11/04/1958  Tobacco Comments   Will smoke cigar every once in awhile   BP Readings from Last 3 Encounters:  01/11/22 116/68  10/17/21 120/60  10/17/21 130/64   Pulse Readings from Last 3 Encounters:  01/11/22 65  10/17/21 62  10/17/21 63   Wt Readings from Last 3 Encounters:  01/11/22 174 lb 9.6 oz (79.2 kg)  10/17/21 173 lb (78.5 kg)  10/17/21 172 lb 9.6 oz (78.3 kg)    Assessment: Review of patient past medical history, allergies, medications, health status, including review of consultants reports, laboratory and other test data, was performed as part of comprehensive evaluation and provision of chronic care management services.   SDOH:  (Social Determinants of Health) assessments and interventions performed:     CCM Care Plan  Allergies   Allergen Reactions   Iodine Other (See Comments)    neck swells   Iohexol Swelling    Neck and gland swelling per patient.    Medications Reviewed Today     Reviewed by Cherre Robins, RPH-CPP (Pharmacist) on 01/11/22 at 1533  Med List Status: <None>   Medication Order Taking? Sig Documenting Provider Last Dose Status Informant  acetaminophen (TYLENOL) 500 MG tablet [id number] Yes Take 1-2 tablets (500-1,000 mg total) by mouth every 6 (six) hours as needed. Lars Pinks M, PA-C Taking Active   albuterol (PROVENTIL) (2.5 MG/3ML) 0.083% nebulizer solution [id number] Yes Take 3 mLs (2.5 mg total) by nebulization every 6 (six) hours as needed for wheezing or shortness of breath. Spero Geralds, MD Taking Active   albuterol (VENTOLIN HFA) [phone] Base) MCG/ACT inhaler [id number] Yes Inhale 2 puffs into the lungs every 6 (six) hours as needed for wheezing or shortness of breath. [name], NP Taking Active   amiodarone (PACERONE) 200 MG tablet  [id number] Yes Take 1 tablet (200 mg total) by mouth daily. Nahser, Wonda Cheng, MD Taking Active   aspirin EC 81 MG tablet [id number] Yes Take 1 tablet (81 mg total) by mouth in the morning. Swallow whole. Nani Skillern, PA-C Taking Active   atorvastatin (LIPITOR) 80 MG tablet [id number] Yes Take 1 tablet (80 mg total) by mouth daily. Nahser, Wonda Cheng, MD Taking Active   Budeson-Glycopyrrol-Formoterol (BREZTRI AEROSPHERE) 160-9-4.8 MCG/ACT Hollie Salk [id number] Yes Inhale 2 puffs into the lungs in the morning and at bedtime. Spero Geralds, MD Taking Active            Med Note [facility], Adria Dill Nov 22, 2021 10:28 AM) [place] for [place] patient [place] program 11/13/2021 thru 11/03/2022  furosemide (LASIX) 20 MG tablet [id number] Yes Take 1 tablet (20 mg total) by mouth daily.  Patient taking differently: Take 20 mg by mouth every other day.   Richardson Dopp T, [place] Taking Active     Discontinued 05/08/21 1058 Multiple Vitamin  (MULTIVITAMIN WITH MINERALS) TABS tablet [id number] Yes Take 1 tablet by mouth daily in the afternoon. [provider] Taking Active   omeprazole (PRILOSEC) 40 MG capsule [id number] Yes Take 1 capsule (40 mg total) by mouth daily. [name], NP Taking Active   potassium chloride (KLOR-CON) 10 MEQ tablet [id number] Yes Take 1 tablet (10 mEq total) by mouth daily. Richardson Dopp T, PA-C Taking Active   predniSONE (DELTASONE) 20 MG tablet [id number] No Take 2 tablets (40 mg total) by mouth daily with breakfast.  Patient not taking: Reported on 01/11/2022   Spero Geralds, MD Not Taking Active   rivaroxaban (XARELTO) 20 MG TABS tablet [id number] Yes TAKE 1 TABLET BY MOUTH DAILY WITH SUPPER Nahser, Wonda Cheng, MD Taking Active   tamsulosin (FLOMAX) 0.4 MG CAPS capsule [id number] Yes TAKE 1 CAPSULE (0.4 MG TOTAL) BY MOUTH DAILY. 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   Coronary artery disease 05/08/2021   COVID-19 virus infection 04/23/2021   Unstable angina ([place]) 04/20/2021   Erectile dysfunction 04/11/2021   Head trauma 03/07/2021   Left leg pain 04/07/2020   Benign prostatic hyperplasia with nocturia 03/08/2020   Seizures ([place]) 02/18/2020   PAF (paroxysmal atrial fibrillation) ([place]) 08/15/2017   Diabetes type 2, controlled ([place]) 07/31/2017   COPD GOLD II  04/03/2017   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[place]) 09/06/2015   Lung cancer ([place]) 11/07/2014   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   HTN (hypertension) 04/29/2014   Meningioma ([place]) 10/25/2013   Low back pain 10/25/2013   Osteoarthritis 08/18/2012   Atypical chest pain 08/14/2011   KERATOSIS 10/09/2010   SCIATICA, RIGHT 10/09/2010    UNSPECIFIED HEARING LOSS 05/21/2010   Hyperlipidemia 05/14/2010   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   RENAL CYST, RIGHT 02/05/2010   Abdominal aortic aneurysm 01/29/2010   LIPOMA 11/17/2009   MICROSCOPIC HEMATURIA 08/11/2008   GERD 07/26/2008   DERMOID CYST 07/25/2008    Immunization History  Administered Date(s) Administered   Fluad Quad(high Dose 65+) 07/23/2019, 09/04/2020, 07/13/2021   Influenza Split 08/02/2011, 08/18/2012   Influenza Whole 08/17/2008   Influenza, High Dose Seasonal PF 08/09/2015,  09/30/2016, 07/30/2017, 08/07/2018   Influenza,inj,Quad PF,6+ Mos 07/27/2013, 08/09/2014   PFIZER Comirnaty(Gray Top)Covid-19 Tri-Sucrose Vaccine 02/16/2021   PFIZER(Purple Top)SARS-COV-2 Vaccination 01/28/2020, 02/21/2020, 09/04/2020   Pfizer Covid-19 Vaccine Bivalent Booster 3yrs & up 08/03/2021   Pneumococcal Conjugate-13 08/09/2014   Pneumococcal Polysaccharide-23 05/14/2010   Td 07/25/2008    Conditions to be addressed/monitored: Atrial Fibrillation, CHF, HTN, HLD, COPD, and Gout  Care Plan : General Pharmacy (Adult)  Updates made by Cherre Robins, RPH-CPP since 01/11/2022 12:00 AM     Problem: Management of chronic medicaiton conditions and medication: Atrial Fibrillation, HTN, HLD, type 2 DM, Pulmonary Disease and BPH; seizures; h/o lung cancer and meningioma; GERD; aortic aneurysm   Priority: High  Onset Date: 02/08/2021  Note:   Current Barriers:  Does not adhere to prescribed medication regimen Management of chronic medical conditions and medication management Unable to afford medication therapy - improved  Pharmacist Clinical Goal(s):  Over the next 180 days, patient will achieve adherence to monitoring guidelines and medication adherence to achieve therapeutic efficacy contact provider office for questions/concerns as evidenced notation of same in electronic health record through collaboration with PharmD and provider.   Interventions: 1:1 collaboration with  [name], NP regarding development and update of comprehensive plan of care as evidenced by provider attestation and co-signature Inter-disciplinary care team collaboration (see longitudinal plan of care) Comprehensive medication review performed; medication list updated in electronic medical record  Diabetes: Controlled; A1c goal < 7.0% Last A1c was 5.8 % (07/13/2021) - has not taken metformin in over 3 months.  Current treatment:  none Current glucose readings: does not check BG at home Current meal patterns: eating a little better since CABG 05/2021. He has been out of work so eating out less. 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  Interventions:  (addressed at previous visit)  Educated on limiting high CHO foods; recommended increase non starchy vegetables Counseled on A1c goal.   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 Also has not been taking potassium supplement 11/29/2021 - he reports today that he is taking furosemide some day when he is not working. This past week took Saturday, Sunday and Monday night. Reports that breathing and abdominal fullness has improved. He reports his weight is down about 9 lbs. Per patient (last week 180 lbs and today 171lbs)  Patient did mention that he has had increase difficulty urinating 2 days this past week (Monday 11/26/2021 and Wednesday 11/28/2021). Does not appear to be directly related to days he took diuretic. Patient endorses that he is taking tamsulosin  0.$RemoveBefor'4mg'yvBMjWKliPSp$  every day.  Patient checks BP at home infrequently - no readings provided EF was 60 to 65% 04/2021 Denies hypotensive/hypertensive symptoms Denies LEE  Interventions: (addressed at previous visit) Educated on BP goals Continue to take furosemide and potassium daily (at least on days he will be at home) Contact office if you have worsening abdominal fullness or any of these other symptoms. Weight gain, shortness of breath, abdominal fullness, swelling in legs or abdomen, Fatigue and weakness, changes in ability to  perform usual activities, persistent cough or wheezing with white or pink blood-tinged mucus, nausea and lack of appetite Weigh daily - report weight gain of more than 3 lbs in 24 hours or 5 lbs in 1 week Will forward message to PCP regarding urinary hesitancy   Hyperlipidemia / CAD (LDL < 70) Controlled Recent CABG x 3 vessels 05/08/2021. Patient continues to have issues with plural effusion s/p CABG.  Followed by cardio and cardiothoracic surgery team.  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 Recommended try to increase intake of non starchy vegetables Recommended continue current therapy Consider recheck lipids with next labs Reviewed refill history and assess adherence - patient filled atorvastatin #90 on 08/07/2021 and 11/09/2021  Chronic Obstructive Pulmonary Disease: Controlled COPD monitored by Dr Shearon Stalls - first visit 10/2021 Current treatment:  albuterol inhaler or nebulizer solution use as needed for shortness of breath or wheezing (rescue inhaler) Breztri - inhaler two puffs into lungs twice a day  [place] 11/13/2021. Called patient assistance  program. Medication has shipped 11/23/2021 - tracking number [id number]) Expected deliver by Monday, January 30th by 7pm. For now patient continues to use Advair inhaler twice a day until [place] arrives.  Patient endorses that he has received Breztri inhalers form [place] and [place] and has started to use.  Patient confirms he did receive nebulizer but not sure that he wants to keep it since he wife also has one he can use.  GOLD Classification: 2 (FEV1 50-79%) Most recent Pulmonary Function Testing: 05/19/2017 - spirometry FVC 2.84 (73%) FEV1 1.74 (62%) FEV1/FVC 61% Current COPD Classification:  B (CATscore >10, <2 exacerbations/yr)  Interventions:  Education provided about inhaler technique for [employer]. Reminded patient of instructions to use 2 puffs twice a day and to remember to rinse mouth after each use.  Provided patient with phone number for [employer] [phone]  Atrial Fibrillation: NSR since last hospitalization but did recently change from diltiazem to amiodarone due to being in afib prior to cardiac cath Current rate/rhythm controller:  amiodarone 200mg  daily Anticoagulant treatment:  Xarelto 20mg  daily with evening meal CHADS2VASc score: 5  08/22/2021: Coordinated with patient assistance program to verify he was approved for Xarelto assistance through 11/03/2021. Patient has been mailed card to use at pharmacy for free Xarelto. I was also faxed a copy and called [facility] pharmacy to give the information to fill Xarelto now.  Will continue to follow in 2023. Cannot apply for Xarelto patient assistance program until reaches coverage gap. Interventions:  Recommended continue current regimen Patient to have LFTs and TSH monitored by cardiologist regularly due to amiodarone therapy Discussed patient assistance program for xarelto - cannot apply until reaches coverage gap and spends 4% of income out of pocket.  Performed medication cost assessment through [employer];  Patient will likely reach Medicare coverage gap in August 2023. Will continue to monitor as assist with patient assistance program application when able.  GERD:  Patient is currently controlled on the following medications:  Omeprazole 40mg  daily Patient has failed these meds in past: None noted  Triggers: laying down after eating Interventions: (addressed at previous visit)  Recommend continue current medications    Health Maintenance:  Reviewed vaccination history and discussed benefits of Shingrix and  Tdap vaccines Has received both annual flu and COVID booster.  Patient to get the following vaccines in 2023 - Shingrix and Tdap vaccines  Patient Goals/Self-Care Activities Over the next 180 days, patient will:  take medications as prescribed and collaborate with provider on medication access solutions Take Breztri - inhaler 2 puffs into lungs twice a day. Remember to rinse mouth after each use.  Follow up with cardiologist and cardiothoracic team Continue cardiac rehab program  Follow Up Plan: Telephone follow up appointment with care management team member scheduled for:  1 month                 Medication Assistance:  Breztri obtained through [facility] and Me medication assistance program.  Enrollment ends 11/03/2022  .    Patient's preferred pharmacy is:  [facility] [address] [place] [address] Phone: [phone] Fax: [facility] [address] [place] [address] Phone: [phone] Fax: [phone]   Follow Up:  Patient agrees to Care Plan and Follow-up.  Plan: Telephone follow up appointment with care management team member scheduled for:  1 month with clinical pharmacist .   Cherre Robins, PharmD Clinical Pharmacist [facility]

## 2022-01-11 NOTE — Patient Instructions (Signed)
Please schedule follow up scheduled with myself in 3 months.  If my schedule is not open yet, we will contact you with a reminder closer to that time. Please call [phone] if you haven't heard from [place] a month before.  ? ?Take prednisone 40 mg for 5 days. This will help your breathing in the short term. ? ?Keep taking the breztri 2 puffs twice a day. Take albuterol inhaler as needed.  ? ?

## 2022-01-11 NOTE — Patient Instructions (Signed)
[name] ?It was a pleasure speaking with you today.  ?Below is a summary of our visit today and information about your health goals.  ? ?Patient Goals/Self-Care Activities ?Over the next 90 days, patient will:  ?Take medications as prescribed and collaborate with provider on medication access solutions ?Follow up with cardiologist and cardiothoracic teams ?Continue to take furosemide and potassium supplement ?Check weight daily ?Discussed signs and symptoms of CHF exacerbation - weight gain, SOB, abdominal fullness, swelling in legs or abdomen, Fatigue and weakness, changes in ability to perform usual activities, persistent cough or wheezing with white or pink blood-tinged mucus, nausea and lack of appetite. Notify medical office if you experience these symptoms ?Take Breztri - inhale 2 puffs into lungs twice a day. Remember to rinse mouth after each use.  ?Continue Xarelto daily - we will apply for patient assistance program when you reach Medicare coverage gap. ? ? ?If you have any questions or concerns, please feel free to contact me either at the phone number below or with a MyChart message.  ? ?Keep up the good work! ? ?Robert Bates, PharmD ?Clinical Pharmacist ?[facility] ?[place] ?[phone] (direct line)  ?[phone] (main office number) ? ? ?Patient verbalizes understanding of instructions and care plan provided today and agrees to view in [place]. Active MyChart status confirmed with patient.    ?

## 2022-01-14 ENCOUNTER — Other Ambulatory Visit (HOSPITAL_BASED_OUTPATIENT_CLINIC_OR_DEPARTMENT_OTHER): Payer: Self-pay

## 2022-01-15 ENCOUNTER — Encounter: Payer: Self-pay | Admitting: Family

## 2022-01-15 ENCOUNTER — Telehealth: Payer: Self-pay | Admitting: Family

## 2022-01-15 ENCOUNTER — Ambulatory Visit (INDEPENDENT_AMBULATORY_CARE_PROVIDER_SITE_OTHER): Payer: Medicare Other | Admitting: Family

## 2022-01-15 ENCOUNTER — Other Ambulatory Visit (HOSPITAL_BASED_OUTPATIENT_CLINIC_OR_DEPARTMENT_OTHER): Payer: Self-pay

## 2022-01-15 VITALS — BP 130/70 | HR 98 | Temp 97.9°F | Resp 16 | Wt 173.0 lb

## 2022-01-15 DIAGNOSIS — K219 Gastro-esophageal reflux disease without esophagitis: Secondary | ICD-10-CM | POA: Diagnosis not present

## 2022-01-15 DIAGNOSIS — K4090 Unilateral inguinal hernia, without obstruction or gangrene, not specified as recurrent: Secondary | ICD-10-CM | POA: Diagnosis not present

## 2022-01-15 DIAGNOSIS — E119 Type 2 diabetes mellitus without complications: Secondary | ICD-10-CM

## 2022-01-15 DIAGNOSIS — E1142 Type 2 diabetes mellitus with diabetic polyneuropathy: Secondary | ICD-10-CM | POA: Diagnosis not present

## 2022-01-15 DIAGNOSIS — J449 Chronic obstructive pulmonary disease, unspecified: Secondary | ICD-10-CM

## 2022-01-15 DIAGNOSIS — I25110 Atherosclerotic heart disease of native coronary artery with unstable angina pectoris: Secondary | ICD-10-CM | POA: Diagnosis not present

## 2022-01-15 DIAGNOSIS — E782 Mixed hyperlipidemia: Secondary | ICD-10-CM

## 2022-01-15 LAB — BASIC METABOLIC PANEL
BUN: 25 mg/dL — ABNORMAL HIGH (ref 6–23)
CO2: 29 mEq/L (ref 19–32)
Calcium: 9.6 mg/dL (ref 8.4–10.5)
Chloride: 101 mEq/L (ref 96–112)
Creatinine, Ser: 0.96 mg/dL (ref 0.40–1.50)
GFR: 74.62 mL/min (ref >60.00)
Glucose, Bld: 135 mg/dL — ABNORMAL HIGH (ref 70–99)
Potassium: 3.8 mEq/L (ref 3.5–5.1)
Sodium: 139 mEq/L (ref 135–145)

## 2022-01-15 LAB — MICROALBUMIN / CREATININE URINE RATIO
Creatinine,U: 23.8 mg/dL
Microalb Creat Ratio: 3.9 mg/g (ref 0.0–30.0)
Microalb, Ur: 0.9 mg/dL (ref 0.0–1.9)

## 2022-01-15 LAB — HEMOGLOBIN A1C: Hgb A1c MFr Bld: 6.5 % (ref 4.6–6.5)

## 2022-01-15 MED ORDER — GABAPENTIN 300 MG PO CAPS
300.0000 mg | ORAL_CAPSULE | Freq: Three times a day (TID) | ORAL | 1 refills | Status: DC
  Filled 2022-01-15: qty 90, 30d supply, fill #0
  Filled 2022-02-14 (×2): qty 90, 30d supply, fill #1

## 2022-01-15 NOTE — Assessment & Plan Note (Addendum)
Just completed prednisone. Has some improvement with albuterol. Management per pulmonology. ?

## 2022-01-15 NOTE — Assessment & Plan Note (Signed)
Stable on omeprazole. Continue same.  ?

## 2022-01-15 NOTE — Patient Instructions (Addendum)
Please schedule a follow up visit with my eye doctor.  ?Start gabapentin at bedtime for foot pain.  ?Complete lab work prior to leaving. ?Try to add regular exercise such as walking.  ?

## 2022-01-15 NOTE — Assessment & Plan Note (Signed)
Lab Results  ?Component Value Date  ? CHOL 140 07/13/2021  ? HDL 67.00 07/13/2021  ? [place] 58 07/13/2021  ? LDLDIRECT 142.2 12/26/2008  ? TRIG 72.0 07/13/2021  ? CHOLHDL 2 07/13/2021  ? ?LDL at goal. Continue lipitor 80mg .  ? ?

## 2022-01-15 NOTE — Telephone Encounter (Signed)
Please call [facility] to request DM eye exam.  ?

## 2022-01-15 NOTE — Progress Notes (Signed)
? ?Subjective:  ? ?By signing my name below, I, Robert Bates, attest that this documentation has been prepared under the direction and in the presence of Robert Alar NP, 01/15/2022   ? ? Patient ID: Robert Bates, male    DOB: 05-May-1941, 81 y.o.   MRN: [id number] ? ?Chief Complaint  ?Patient presents with  ? Diabetes  ?  Here for follow up  ? Groin Pain  ?  Complains of right inguinal pain  ? ? ?HPI ?Patient is in today for an office visit.  ? ?Right Groin Pain - Patient complains of right groin pain. His pain elevates when he takes a shower. He states that his testicle area is tender to the touch.  ? ?Feet - He complains of loss of feeling on bottom of feet. He experiences these symptoms when he first goes to bed. ? ?Left Eye - He has slight visual impairment in the left eye that bothers him. He plans to go to [facility] in the future.  ? ?Breathing - Patient continues to take Prednisone. He has been using inhalers in cases of shortness of breath. He continues not to smoke. ? ?Cardiovascular - Patient is regularly seeing a cardiologist. ? ?Heartburn/Reflux - He has no symptoms of heartburn and reflux. ? ?Exercise - He expresses interest in losing weight. ?Wt Readings from Last 3 Encounters:  ?01/15/22 173 lb (78.5 kg)  ?01/11/22 174 lb 9.6 oz (79.2 kg)  ?10/17/21 173 lb (78.5 kg)  ? ? ? ? ?Health Maintenance Due  ?Topic Date Due  ? OPHTHALMOLOGY EXAM  Never done  ? Zoster Vaccines- Shingrix (1 of 2) Never done  ? TETANUS/TDAP  07/25/2018  ? URINE MICROALBUMIN  06/01/2021  ? HEMOGLOBIN A1C  01/10/2022  ? ? ?Past Medical History:  ?Diagnosis Date  ? Anxiety   ? Arthritis   ? Cancer ([place]) 2016  ? 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  ? COPD (chronic obstructive pulmonary disease) ([place])   ? Coronary artery disease   ? Diabetes type 2, controlled ([place]) 07/31/2017  ? Dyspnea   ? Dysrhythmia   ? a fib  ? GERD (gastroesophageal reflux disease)    ? Hematuria   ? refuses work up or referral - understands risks of morbidity / mortality - 11/2008, 12/2008  ? History of hiatal hernia   ? History of kidney stones   ? Hyperlipemia   ? Meningioma ([place]) 10/25/2013  ? Follows with Dr. [name].   ? Peripheral vascular disease ([place])   ? Abdominal Aortic Aneursym  ? Pneumonia   ? as a child  ? Radiation 09/18/15-10/25/15  ? left lower lobe 70.2 Gy  ? Seizures ([place]) 02/18/2020  ? Tobacco abuse   ? ? ?Past Surgical History:  ?Procedure Laterality Date  ? CHOLECYSTECTOMY N/A 07/23/2017  ? Procedure: LAPAROSCOPIC CHOLECYSTECTOMY;  Surgeon: Kinsinger, Arta Bruce, MD;  Location: [facility];  Service: General;  Laterality: N/A;  ? CLIPPING OF ATRIAL APPENDAGE Left 05/08/2021  ? Procedure: CLIPPING OF ATRIAL APPENDAGE USING 53 ATRICLIP;  Surgeon: Wonda Olds, MD;  Location: [facility];  Service: Open Heart Surgery;  Laterality: Left;  ? COLONOSCOPY    ? CORONARY ARTERY BYPASS GRAFT N/A 05/08/2021  ? Procedure: CORONARY ARTERY BYPASS GRAFTING (CABG)X 3 USING LEFT INTERNAL MAMMARY ARTERY AND RIGHT GREATER SAPEHNOUS VEIN;  Surgeon: Wonda Olds, MD;  Location: [facility];  Service: Open Heart Surgery;  Laterality: N/A;  ?  ENDOVEIN HARVEST OF GREATER SAPHENOUS VEIN Right 05/08/2021  ? Procedure: ENDOVEIN HARVEST OF GREATER SAPHENOUS VEIN;  Surgeon: Wonda Olds, MD;  Location: [name];  Service: Open Heart Surgery;  Laterality: Right;  ? EYE SURGERY Bilateral   ? Cataracts removed w/ lens implant  ? HERNIA REPAIR    ? Left 36 years ago . Right inguinal hernia repair 10-01-17 Dr. Kieth Brightly  ? INGUINAL HERNIA REPAIR Right 10/01/2017  ? Procedure: RIGHT INGUINAL HERNIA REPAIR WITH MESH;  Surgeon: Kinsinger, Arta Bruce, MD;  Location: [facility];  Service: General;  Laterality: Right;  TAP BLOCK  ? INSERTION OF MESH Right 10/01/2017  ? Procedure: INSERTION OF MESH;  Surgeon: Kinsinger, Arta Bruce, MD;  Location: [facility];  Service: General;  Laterality: Right;  ? IR THORACENTESIS ASP PLEURAL  SPACE W/IMG GUIDE  05/18/2021  ? IR THORACENTESIS ASP PLEURAL SPACE W/IMG GUIDE  06/07/2021  ? LEFT HEART CATH AND CORONARY ANGIOGRAPHY N/A 04/20/2021  ? Procedure: LEFT HEART CATH AND CORONARY ANGIOGRAPHY;  Surgeon: Wellington Hampshire, MD;  Location: [facility];  Service: Cardiovascular;  Laterality: N/A;  ? TEE WITHOUT CARDIOVERSION N/A 05/08/2021  ? Procedure: TRANSESOPHAGEAL ECHOCARDIOGRAM (TEE);  Surgeon: Wonda Olds, MD;  Location: [facility];  Service: Open Heart Surgery;  Laterality: N/A;  ? TONSILLECTOMY    ? TONSILLECTOMY    ? VIDEO BRONCHOSCOPY Bilateral 07/26/2015  ? Procedure: VIDEO BRONCHOSCOPY WITH FLUORO;  Surgeon: [name], MD;  Location: [name];  Service: Cardiopulmonary;  Laterality: Bilateral;  ? VIDEO BRONCHOSCOPY WITH ENDOBRONCHIAL NAVIGATION N/A 08/23/2015  ? Procedure: VIDEO BRONCHOSCOPY WITH ENDOBRONCHIAL NAVIGATION;  Surgeon: Grace Isaac, MD;  Location: [facility];  Service: Thoracic;  Laterality: N/A;  ? VIDEO BRONCHOSCOPY WITH ENDOBRONCHIAL ULTRASOUND N/A 08/23/2015  ? Procedure: VIDEO BRONCHOSCOPY WITH ENDOBRONCHIAL ULTRASOUND;  Surgeon: Grace Isaac, MD;  Location: [facility];  Service: Thoracic;  Laterality: N/A;  ? ? ?Family History  ?Problem Relation Age of Onset  ? Leukemia Father   ? Emphysema Father   ? Learning disabilities Son   ? Atrial fibrillation Son   ? Leukemia Other   ? Stroke Other   ? ? ?Social History  ? ?Socioeconomic History  ? Marital status: Married  ?  Spouse name: Not on file  ? Number of children: 2  ? Years of education: Not on file  ? Highest education level: Not on file  ?Occupational History  ? Occupation: Retired  ?  Employer: [employer]  ?  Comment: truck driver  ?  Employer: [employer]  ?Tobacco Use  ? Smoking status: Former  ?  Packs/day: 1.00  ?  Years: 57.00  ?  Pack years: 57.00  ?  Types: Cigarettes, Cigars  ?  Quit date: 08/08/2015  ?  Years since quitting: 6.4  ? Smokeless tobacco: Former  ?  Types: Chew  ?  Quit date: 11/04/1958  ?  Tobacco comments:  ?  Will smoke cigar every once in awhile  ?Vaping Use  ? Vaping Use: Former  ?Substance and Sexual Activity  ? Alcohol use: Not Currently  ?  Alcohol/week: 0.0 standard drinks  ? Drug use: No  ? Sexual activity: Not Currently  ?Other Topics Concern  ? Not on file  ?Social History Narrative  ? Not on file  ? ?Social Determinants of Health  ? ?Financial Resource Strain: Medium Risk  ? Difficulty of Paying Living Expenses: Somewhat hard  ?Food Insecurity: No Food Insecurity  ? Worried About  Running Out of Food in the Last Year: Never true  ? Ran Out of Food in the Last Year: Never true  ?Transportation Needs: No Transportation Needs  ? Lack of Transportation (Medical): No  ? Lack of Transportation (Non-Medical): No  ?Physical Activity: Insufficiently Active  ? Days of Exercise per Week: 2 days  ? Minutes of Exercise per Session: 40 min  ?Stress: No Stress Concern Present  ? Feeling of Stress : Not at all  ?Social Connections: Socially Isolated  ? Frequency of Communication with Friends and Family: Once a week  ? Frequency of Social Gatherings with Friends and Family: Once a week  ? Attends Religious Services: Never  ? Active Member of Clubs or Organizations: No  ? Attends Archivist Meetings: Never  ? Marital Status: Married  ?Intimate Partner Violence: Not At Risk  ? Fear of Current or Ex-Partner: No  ? Emotionally Abused: No  ? Physically Abused: No  ? Sexually Abused: No  ? ? ?Outpatient Medications Prior to Visit  ?Medication Sig Dispense Refill  ? acetaminophen (TYLENOL) 500 MG tablet Take 1-2 tablets (500-1,000 mg total) by mouth every 6 (six) hours as needed. 30 tablet 0  ? albuterol (PROVENTIL) (2.5 MG/3ML) 0.083% nebulizer solution Take 3 mLs (2.5 mg total) by nebulization every 6 (six) hours as needed for wheezing or shortness of breath. 75 mL 12  ? albuterol (VENTOLIN HFA) 108 (90 Base) MCG/ACT inhaler Inhale 2 puffs into the lungs every 6 (six) hours as needed for wheezing or  shortness of breath. 8.5 g 5  ? amiodarone (PACERONE) 200 MG tablet Take 1 tablet (200 mg total) by mouth daily. 90 tablet 3  ? aspirin EC 81 MG tablet Take 1 tablet (81 mg total) by mouth in the morning

## 2022-01-15 NOTE — Assessment & Plan Note (Signed)
New complaint. Trial of gabapentin 300 mg once daily.  ?

## 2022-01-15 NOTE — Assessment & Plan Note (Addendum)
Lab Results  ?Component Value Date  ? HGBA1C 5.8 07/13/2021  ? HGBA1C 6.5 04/11/2021  ? HGBA1C 6.0 (H) 12/08/2020  ? ?Lab Results  ?Component Value Date  ? MICROALBUR 1.0 06/01/2020  ? [place] 58 07/13/2021  ? CREATININE 0.96 08/03/2021  ? ?Was stable. Recheck A1C.  Continue metformin.  ?

## 2022-01-15 NOTE — Assessment & Plan Note (Signed)
New. Refer to general surgeon for consultation.  ?

## 2022-01-15 NOTE — Assessment & Plan Note (Signed)
Has follow up scheduled with cardiology.  ?

## 2022-01-16 NOTE — Telephone Encounter (Signed)
Records release request faxed to [facility] ?

## 2022-01-23 ENCOUNTER — Telehealth: Payer: Medicare Other

## 2022-01-30 ENCOUNTER — Ambulatory Visit: Payer: Medicare Other | Admitting: Pharmacist

## 2022-01-30 DIAGNOSIS — E119 Type 2 diabetes mellitus without complications: Secondary | ICD-10-CM

## 2022-01-30 DIAGNOSIS — J449 Chronic obstructive pulmonary disease, unspecified: Secondary | ICD-10-CM

## 2022-01-30 DIAGNOSIS — I25110 Atherosclerotic heart disease of native coronary artery with unstable angina pectoris: Secondary | ICD-10-CM

## 2022-01-30 DIAGNOSIS — E782 Mixed hyperlipidemia: Secondary | ICD-10-CM

## 2022-01-30 NOTE — Chronic Care Management (AMB) (Signed)
? ? ?Chronic Care Management ?Pharmacy Note ? ?01/30/2022 ?Name:  Robert Bates MRN:  [id number] DOB:  [birth year] ? ?Summary: ?Reviewed adherence and reminded patient that atorvastatin and amiodarone would be due refills next week. Patient reported that she sometimes misses medication doses if he has out of town for work on the weekends. Recommended he purchase a pill container so he can easily take with him. ?Reminded patient to follow up with optometrist regarding vision changes and to check vision with new glasses since patient does not feel that they are enhancing vision as he expected.  ?Patient is tolerating gabapentin 300mg  and taking usually 2 times a day (forgets midday dose). He was not sure why he needed a medication for his "nerves" but education provided that gabapentin helps with the numbness and tingling in his feet which is related to nerves he states gabapentin is helping with nerve pain.  ?Will continue to monitor and assist with patient assistance program application for [facility] when needed. (Has to spend 4% out of pocket and reach medicare gap before he can apply) ?  ? ?Subjective: ?Robert Bates is an 81 y.o. year old male who is a primary patient of [name], NP.  The CCM team was consulted for assistance with disease management and care coordination needs.   ? ?Engaged with patient by telephone for  follow up  in response to provider referral for pharmacy case management and/or care coordination services.  ? ?Consent to Services:  ?The patient was given information about Chronic Care Management services, agreed to services, and gave verbal consent prior to initiation of services.  Please see initial visit note for detailed documentation.  ? ?Patient Care Team: ?[name], NP as PCP - General ?Nahser, Wonda Cheng, MD as PCP - Cardiology (Cardiology) ?Elsie Stain, MD as Attending Physician (Pulmonary Disease) ?Virgina Evener, OD (Optometry) ?Cherre Robins, RPH-CPP  (Pharmacist) ?Sharmon Revere as Physician Assistant (Cardiology) ?Spero Geralds, MD as Consulting Physician (Pulmonary Disease) ? ?Recent office visits: ?01/15/2022 - Int Med [facility]) Seen for right groin pain, neuropathy and left eye vision changes. Referred to general surgeon for consultation for inguinal hernia. Started gabapentin 300mg  once daily for neuropathy ?10/12/2021 - Int Med Inda [facility] NP) F/U COPD exacerbation. Referred to pulmonology and cardiology ?10/03/2021 -  Int Med Inda [facility] NP) COPD exacerbation. Neb treatment and solumedrol in office. Prescribed prednisone 10mg  - take 40mg  for 2 days 30mg  for 2 days, 20mg  for 2 days then 10mg  for 2 days ?07/13/2021 - PCP Inda [facility], NP) F/U chronic conditions. Removed furosemide from med list (patient preferance) and tramadols (completed course) ? ?Recent consult visits: ?01/11/2022 - Pulmonology (Dr Shearon Stalls) Prescribed prednisone 40mg  daily for 5 days.  ?10/17/2021 - Pulmonology (Dr Shearon Stalls) new patient evaluation of dyspnea, chest tightness and wheezing. Stopped Advair. Started [employer]. also prescribed home nebulizer machine to use albuterol as needed.  ?10/17/2021 - Cardio (Dr Acie Fredrickson) F/U afib. Ordered ECHO due to dyspnea. No medication changes noted.  ?07/20/2021 - Cardio [place], [facility]) F/U CAD s/p CABG 05/2021.  Continued Xarelto for Afib; Plan to stop aspirin 81mg  3 months from CABG (October 2022 - but pt to check with surgeon). Added furosemide 20mg  daily and potassium 10 mEq daily. Recheck BMP in 2 weeks.  ?07/11/2021 - Cardiothoracic surgery [facility], [facility]) F/u CABG with complecation of plural effusion and left thoracenteisi 05/18/2021 and 06/07/2021. Notes suggest restarting furosemide for LEE but not added back to med list.  ? ?  ?Hospital visits: ?None in last 6 months ? ? ?  Objective: ? ?Lab Results  ?Component Value Date  ? CREATININE 0.96 01/15/2022  ? CREATININE 0.96 08/03/2021  ? CREATININE 0.86 06/14/2021  ? ? ?Lab Results  ?Component  Value Date  ? HGBA1C 6.5 01/15/2022  ? ?Last diabetic Eye exam: No results found for: HMDIABEYEEXA  ?Last diabetic Foot exam: No results found for: HMDIABFOOTEX  ? ?   ?Component Value Date/Time  ? CHOL 140 07/13/2021 1336  ? TRIG 72.0 07/13/2021 1336  ? HDL 67.00 07/13/2021 1336  ? CHOLHDL 2 07/13/2021 1336  ? VLDL 14.4 07/13/2021 1336  ? [place] 58 07/13/2021 1336  ? [place] 62 04/11/2021 1421  ? LDLDIRECT 142.2 12/26/2008 1032  ? ? ? ?  Latest Ref Rng & Units 06/14/2021  ?  3:13 PM 05/04/2021  ?  2:30 PM 12/08/2020  ?  4:09 PM  ?Hepatic Function  ?Total Protein 6.5 - 8.1 g/dL 5.9   5.9   6.1    ?Albumin 3.5 - 5.0 g/dL 2.8   3.3     ?AST 15 - 41 U/L $Remo'11   16   13    'QdFVy$ ?ALT 0 - 44 U/L $Remo'13   13   12    'EYGjc$ ?Alk Phosphatase 38 - 126 U/L 103   80     ?Total Bilirubin 0.3 - 1.2 mg/dL 0.4   0.5   0.4    ? ? ?Lab Results  ?Component Value Date/Time  ? TSH 4.26 08/03/2021 08:25 AM  ? TSH 1.31 11/08/2019 02:55 PM  ? ? ? ?  Latest Ref Rng & Units 06/14/2021  ?  3:13 PM 05/17/2021  ? 12:57 AM 05/15/2021  ?  2:30 AM  ?CBC  ?WBC 4.0 - 10.5 K/uL 6.8   9.3   9.4    ?Hemoglobin 13.0 - 17.0 g/dL 10.4   9.5   9.5    ?Hematocrit 39.0 - 52.0 % 32.4   29.9   29.7    ?Platelets 150 - 400 K/uL 247   302   268    ? ? ?No results found for: VD25OH ? ?Clinical ASCVD: Yes  ?The ASCVD Risk score (Arnett DK, et al., 2019) failed to calculate for the following reasons: ?  The 2019 ASCVD risk score is only valid for ages 18 to 8   ? ?Other: CHADS2VASc = 5  ? ?Social History  ? ?Tobacco Use  ?Smoking Status Former  ? Packs/day: 1.00  ? Years: 57.00  ? Pack years: 57.00  ? Types: Cigarettes, Cigars  ? Quit date: 08/08/2015  ? Years since quitting: 6.4  ?Smokeless Tobacco Former  ? Types: Chew  ? Quit date: 11/04/1958  ?Tobacco Comments  ? Will smoke cigar every once in awhile  ? ?BP Readings from Last 3 Encounters:  ?01/15/22 130/70  ?01/11/22 116/68  ?10/17/21 120/60  ? ?Pulse Readings from Last 3 Encounters:  ?01/15/22 98  ?01/11/22 65  ?10/17/21 62  ? ?Wt  Readings from Last 3 Encounters:  ?01/15/22 173 lb (78.5 kg)  ?01/11/22 174 lb 9.6 oz (79.2 kg)  ?10/17/21 173 lb (78.5 kg)  ? ? ?Assessment: Review of patient past medical history, allergies, medications, health status, including review of consultants reports, laboratory and other test data, was performed as part of comprehensive evaluation and provision of chronic care management services.  ? ?SDOH:  (Social Determinants of Health) assessments and interventions performed:  ? ? ? ?CCM Care Plan ? ?Allergies  ?Allergen Reactions  ? Iodine Other (See Comments)  ?  neck swells  ? Iohexol Swelling  ?  Neck and gland swelling per patient.  ? ? ?Medications Reviewed Today   ? ? Reviewed by Cherre Robins, RPH-CPP (Pharmacist) on 01/30/22 at [place] List Status: <None>  ? ?Medication Order Taking? Sig Documenting Provider Last Dose Status Informant  ?acetaminophen (TYLENOL) 500 MG tablet [id number] Yes Take 1-2 tablets (500-1,000 mg total) by mouth every 6 (six) hours as needed. Nani Skillern, PA-C Taking Active   ?albuterol (PROVENTIL) (2.5 MG/3ML) 0.083% nebulizer solution [id number] Yes Take 3 mLs (2.5 mg total) by nebulization every 6 (six) hours as needed for wheezing or shortness of breath. Spero Geralds, MD Taking Active   ?albuterol (VENTOLIN HFA) 108 (90 Base) MCG/ACT inhaler [id number] Yes Inhale 2 puffs into the lungs every 6 (six) hours as needed for wheezing or shortness of breath. [name], NP Taking Active   ?amiodarone (PACERONE) 200 MG tablet [id number] Yes Take 1 tablet (200 mg total) by mouth daily. Nahser, Wonda Cheng, MD Taking Active   ?aspirin EC 81 MG tablet [id number] Yes Take 1 tablet (81 mg total) by mouth in the morning. Swallow whole. Nani Skillern, PA-C Taking Active   ?atorvastatin (LIPITOR) 80 MG tablet [id number] Yes Take 1 tablet (80 mg total) by mouth daily. Nahser, Wonda Cheng, MD Taking Active   ?Budeson-Glycopyrrol-Formoterol (BREZTRI AEROSPHERE) 160-9-4.8 MCG/ACT  AERO [id number] Yes Inhale 2 puffs into the lungs in the morning and at bedtime. Spero Geralds, MD Taking Active   ?         ?Med Note Antony Contras, Sanam Marmo B   Thu Nov 22, 2021 10:28 AM) Approved for [place] and [place] patient assi

## 2022-01-30 NOTE — Patient Instructions (Signed)
[name] ?It was a pleasure speaking with you  ?Below is a summary of your health goals and care plan (See Below) ? ?If you have any questions or concerns, please feel free to contact me either at the phone number below or with a MyChart message.  ? ?Keep up the good work! ? ?Robert Bates, PharmD ?Clinical Pharmacist ?[facility] ?[place] ?[phone] (direct line)  ?[phone] (main office number) ? ? ?Chronic Care Management Care Plan ? ?Hypertension / congestive heart disease: ?BP Readings from Last 3 Encounters:  ?01/15/22 130/70  ?01/11/22 116/68  ?10/17/21 120/60  ? ?Pharmacist Clinical Goal(s): ?Over the next 90 days, patient will work with PharmD and providers to maintain BP goal <140/90 ?Current regimen:  ?Furosemide 20mg  daily  ?Potassium chloride 10 mEq daily  ?Interventions: ?Discussed blood pressure goal ?Patient self care activities - Over the next 90 days, patient will: ?Checked blood pressure at home 2 to 3 times per week and record for future visits ?Continue current regimen  ?Contact office if you have worsening abdominal fullness or any of these other symptoms. Weight gain, shortness of breath, abdominal fullness, swelling in legs or abdomen, Fatigue and weakness, changes in ability to perform usual activities, persistent cough or wheezing with white or pink blood-tinged mucus, nausea and lack of appetite ?Weigh daily - report weight gain of more than 3 lbs in 24 hours or 5 lbs in 1 week  ? ?Hyperlipidemia / Heart disease - recent bypass surgery ?Lab Results  ?Component Value Date/Time  ? LDLCALC 58 07/13/2021 01:36 PM  ? LDLCALC 62 04/11/2021 02:21 PM  ? LDLDIRECT 142.2 12/26/2008 10:32 AM  ? ?Pharmacist Clinical Goal(s): ?Over the next 90 days, patient will work with PharmD and providers to maintain LDL goal < 100 ?Current regimen:  ?Atorvastatin 80mg  daily ?Interventions: ?Discussed LDL goal ?Counseled on avoiding high fat / cholesterol containing foods. Recommended  try to increase intake of non starchy vegetables ?Patient self care activities - Over the next 90 days, patient will: ?Maintain cholesterol medication regimen. ?Increase intake of non starchy vegetables ?Recommended continue current therapy ? ?Diabetes ?Lab Results  ?Component Value Date/Time  ? HGBA1C 6.5 01/15/2022 11:48 AM  ? HGBA1C 5.8 07/13/2021 01:36 PM  ? ?Pharmacist Clinical Goal(s): ?Over the next 90 days, patient will work with PharmD and providers to maintain A1c goal <7% ?Current regimen:  ?Diet and exercise recommeded ?Interventions: ?Discussed A1c goal ?Patient self care activities - Over the next 90 days, patient will: ?Maintain A1c <7% ?Limit intake of sugar (especially cherry pie) , potatoes, pasta, rice and bread.  ?Consider restarting metformin 500mg  twice a day if next A1c is 6.5% or higher ? ?COPD ?Pharmacist Clinical Goal(s) ?Over the next 90 days, patient will work with PharmD and providers to reduce symptoms associated with COPD and reduce barriers to access of preferred medication regimens ?Current regimen:  ?Albuterol HFA 2 puffs every 6 hours as needed for wheezing ?Breztri inhaler - inhaler 2 puffs into lungs twice a day  ?Interventions: ?Robert Bates has been approved 01/102023 through 11/03/2022. ?Patient self care activities - Over the next 90 days, patient will: ?Maintain medication regimen for COPD ?Albuterol inhaler is to be used only if needed (rescue inhaler)  ?Continue Breztri as directed. Remember to rinse mouth after each use.  ? ?Atrial fibrillation ?Pharmacist Clinical Goal(s) ?Over the next 90 days, patient will work with PharmD and providers to reduce risk of stroke associated with Afib and reduce barriers to preferred medication regimen ?  Current regimen:  ?Amiodarone 200mg  daily  ?Xarelto 20mg  daily ?Interventions: ?None today ?Patient self care activities - Over the next 90 days, patient will: ?Maintain current medication regimen for atrial fibrillation ?Continue to have liver  and thyroid checked every 6 months while on amiodarone therapy  ? ?Medication management ?Pharmacist Clinical Goal(s): ?Over the next 90 days, patient will work with PharmD and providers to maintain optimal medication adherence ?Current pharmacy: [facility] ?Interventions ?Comprehensive medication review performed. ?Continue current medication management strategy ?Recommended getting weekly pill container to take when he is away from home to help remember to take his medications.  ?Patient self care activities - Over the next 90 days, patient will: ?Focus on medication adherence by filling and taking medications appropriately  ?Take medications as prescribed ?Report any questions or concerns to PharmD and/or provider(s) ? ?Health Maintenance:  ?Reviewed vaccination history and discussed benefits of tetanus and Shingrix vaccine ?Patient to get tetanus and Shingrix vaccine in 2023.  ? ? ?Patient Goals/Self-Care Activities ?Over the next 90 days, patient will:  ?take medications as prescribed and collaborate with provider on medication access solutions ?Take Breztri - inhaler 2 puffs into lungs twice a day. Remember to rinse mouth after each use.  ?Contact [facility] regarding vision and glasses.  ?Refill atorvastatin and amiodarone.  ?Check weight daily ?Discussed signs and symptoms of CHF exacerbation - weight gain, SOB, abdominal fullness, swelling in legs or abdomen, Fatigue and weakness, changes in ability to perform usual activities, persistent cough or wheezing with white or pink blood-tinged mucus, nausea and lack of appetite. Notify medical office if you experience these symptoms ?Purchase weekly pill reminder / container. Use daily or when you will be away from home so that you don't miss medication doses.  ? ?Patient verbalizes understanding of instructions and care plan provided today and agrees to view in [place]. Active MyChart status confirmed with patient.    ?

## 2022-02-01 DIAGNOSIS — J449 Chronic obstructive pulmonary disease, unspecified: Secondary | ICD-10-CM | POA: Diagnosis not present

## 2022-02-01 DIAGNOSIS — I480 Paroxysmal atrial fibrillation: Secondary | ICD-10-CM

## 2022-02-01 DIAGNOSIS — I25110 Atherosclerotic heart disease of native coronary artery with unstable angina pectoris: Secondary | ICD-10-CM

## 2022-02-01 DIAGNOSIS — E119 Type 2 diabetes mellitus without complications: Secondary | ICD-10-CM

## 2022-02-01 DIAGNOSIS — E782 Mixed hyperlipidemia: Secondary | ICD-10-CM | POA: Diagnosis not present

## 2022-02-01 DIAGNOSIS — J441 Chronic obstructive pulmonary disease with (acute) exacerbation: Secondary | ICD-10-CM | POA: Diagnosis not present

## 2022-02-01 NOTE — Telephone Encounter (Signed)
error 

## 2022-02-14 ENCOUNTER — Other Ambulatory Visit (HOSPITAL_BASED_OUTPATIENT_CLINIC_OR_DEPARTMENT_OTHER): Payer: Self-pay

## 2022-03-05 DIAGNOSIS — H43813 Vitreous degeneration, bilateral: Secondary | ICD-10-CM | POA: Diagnosis not present

## 2022-03-15 ENCOUNTER — Ambulatory Visit (HOSPITAL_BASED_OUTPATIENT_CLINIC_OR_DEPARTMENT_OTHER)
Admission: RE | Admit: 2022-03-15 | Discharge: 2022-03-15 | Disposition: A | Discharge status: Home / Self Care | Payer: Medicare Other | Source: Ambulatory Visit | Attending: Family | Admitting: Family

## 2022-03-15 ENCOUNTER — Telehealth: Payer: Self-pay | Admitting: Family

## 2022-03-15 ENCOUNTER — Ambulatory Visit (INDEPENDENT_AMBULATORY_CARE_PROVIDER_SITE_OTHER): Payer: Medicare Other | Admitting: Family

## 2022-03-15 ENCOUNTER — Encounter: Payer: Self-pay | Admitting: Family

## 2022-03-15 VITALS — BP 135/70 | HR 58 | Temp 98.1°F | Resp 16 | Ht 67.0 in | Wt 181.5 lb

## 2022-03-15 DIAGNOSIS — I4819 Other persistent atrial fibrillation: Secondary | ICD-10-CM | POA: Diagnosis not present

## 2022-03-15 DIAGNOSIS — R3129 Other microscopic hematuria: Secondary | ICD-10-CM

## 2022-03-15 DIAGNOSIS — I714 Abdominal aortic aneurysm, without rupture, unspecified: Secondary | ICD-10-CM

## 2022-03-15 DIAGNOSIS — M5414 Radiculopathy, thoracic region: Secondary | ICD-10-CM | POA: Insufficient documentation

## 2022-03-15 DIAGNOSIS — C3432 Malignant neoplasm of lower lobe, left bronchus or lung: Secondary | ICD-10-CM

## 2022-03-15 DIAGNOSIS — M5416 Radiculopathy, lumbar region: Secondary | ICD-10-CM

## 2022-03-15 DIAGNOSIS — I200 Unstable angina: Secondary | ICD-10-CM

## 2022-03-15 DIAGNOSIS — R569 Unspecified convulsions: Secondary | ICD-10-CM

## 2022-03-15 DIAGNOSIS — I712 Thoracic aortic aneurysm, without rupture, unspecified: Secondary | ICD-10-CM | POA: Diagnosis not present

## 2022-03-15 DIAGNOSIS — R399 Unspecified symptoms and signs involving the genitourinary system: Secondary | ICD-10-CM | POA: Diagnosis not present

## 2022-03-15 DIAGNOSIS — I5032 Chronic diastolic (congestive) heart failure: Secondary | ICD-10-CM | POA: Insufficient documentation

## 2022-03-15 LAB — POC URINALSYSI DIPSTICK (AUTOMATED)
Bilirubin, UA: NEGATIVE
Clarity, UA: NEGATIVE
Color, UA: NEGATIVE
Glucose, UA: NEGATIVE
Ketones, UA: NEGATIVE
Leukocytes, UA: NEGATIVE
Nitrite, UA: NEGATIVE
Protein, UA: NEGATIVE
Spec Grav, UA: 1.025 (ref 1.010–1.025)
Urobilinogen, UA: 0.2 E.U./dL
pH, UA: 6 (ref 5.0–8.0)

## 2022-03-15 NOTE — Assessment & Plan Note (Signed)
Clinically stable. Scheduled for follow up with Oncology in August.  ?

## 2022-03-15 NOTE — Patient Instructions (Signed)
Please complete your x-ray on the first floor.  ?

## 2022-03-15 NOTE — Progress Notes (Signed)
? ?Subjective:  ? ?By signing my name below, I, Robert Bates, attest that this documentation has been prepared under the direction and in the presence of [place], NP 03/15/2022  ? ? Patient ID: Robert Bates, male    DOB: [birth year], 81 y.o.   MRN: [id number] ? ?Chief Complaint  ?Patient presents with  ? Urinary Tract Infection  ?  "Quite a while", urine frequency, he is still taking his Flomax  ? ? ?HPI ?Patient is in today for an office visit. ? ?Urinary Frequency - He complains of increased urinary frequency. He states that on an average work day, he gets up for the morning and gets ready. He also drinks coffee during the mornings. Once he gets into his truck, he notes an increased frequency to urinate which occurs throughout the day.  ?Right Lower Back Pain - He complains of right lower back pain. Symptoms worsen when he is moving but subsides when he is sitting. He states that exercises do not work.  ?Weight - He notes that his weight is increasing. He states that he has an increased sense of hunger which he believes contributes to his weight gain.  ?Wt Readings from Last 3 Encounters:  ?03/15/22 181 lb 8 oz (82.3 kg)  ?01/15/22 173 lb (78.5 kg)  ?01/11/22 174 lb 9.6 oz (79.2 kg)  ? ? ?Health Maintenance Due  ?Topic Date Due  ? Zoster Vaccines- Shingrix (1 of 2) Never done  ? TETANUS/TDAP  07/25/2018  ? ? ?Past Medical History:  ?Diagnosis Date  ? Anxiety   ? Arthritis   ? Cancer ([place]) 2016  ? 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  ? COPD (chronic obstructive pulmonary disease) ([place] [place])   ? Coronary artery disease   ? Diabetes type 2, controlled ([place]) 07/31/2017  ? Dyspnea   ? Dysrhythmia   ? a fib  ? GERD (gastroesophageal reflux disease)   ? Hematuria   ? refuses work up or referral - understands risks of morbidity / mortality - 11/2008, 12/2008  ? History of hiatal hernia   ? History of kidney stones   ? Hyperlipemia   ? Meningioma  ([place]) 10/25/2013  ? Follows with Dr. [name].   ? Peripheral vascular disease ([place])   ? Abdominal Aortic Aneursym  ? Pneumonia   ? as a child  ? Radiation 09/18/15-10/25/15  ? left lower lobe 70.2 Gy  ? Seizures ([place]) 02/18/2020  ? Tobacco abuse   ? ? ?Past Surgical History:  ?Procedure Laterality Date  ? CHOLECYSTECTOMY N/A 07/23/2017  ? Procedure: LAPAROSCOPIC CHOLECYSTECTOMY;  Surgeon: Kinsinger, Arta Bruce, MD;  Location: [facility];  Service: General;  Laterality: N/A;  ? CLIPPING OF ATRIAL APPENDAGE Left 05/08/2021  ? Procedure: CLIPPING OF ATRIAL APPENDAGE USING 4 ATRICLIP;  Surgeon: Wonda Olds, MD;  Location: [facility];  Service: Open Heart Surgery;  Laterality: Left;  ? COLONOSCOPY    ? CORONARY ARTERY BYPASS GRAFT N/A 05/08/2021  ? Procedure: CORONARY ARTERY BYPASS GRAFTING (CABG)X 3 USING LEFT INTERNAL MAMMARY ARTERY AND RIGHT GREATER SAPEHNOUS VEIN;  Surgeon: Wonda Olds, MD;  Location: [facility];  Service: Open Heart Surgery;  Laterality: N/A;  ? ENDOVEIN HARVEST OF GREATER SAPHENOUS VEIN Right 05/08/2021  ? Procedure: ENDOVEIN HARVEST OF GREATER SAPHENOUS VEIN;  Surgeon: Wonda Olds, MD;  Location: [facility];  Service: Open Heart Surgery;  Laterality: Right;  ? EYE SURGERY Bilateral   ?  Cataracts removed w/ lens implant  ? HERNIA REPAIR    ? Left 36 years ago . Right inguinal hernia repair 10-01-17 Dr. Kieth Brightly  ? INGUINAL HERNIA REPAIR Right 10/01/2017  ? Procedure: RIGHT INGUINAL HERNIA REPAIR WITH MESH;  Surgeon: Kinsinger, Arta Bruce, MD;  Location: [facility];  Service: General;  Laterality: Right;  TAP BLOCK  ? INSERTION OF MESH Right 10/01/2017  ? Procedure: INSERTION OF MESH;  Surgeon: Kinsinger, Arta Bruce, MD;  Location: [facility];  Service: General;  Laterality: Right;  ? IR THORACENTESIS ASP PLEURAL SPACE W/IMG GUIDE  05/18/2021  ? IR THORACENTESIS ASP PLEURAL SPACE W/IMG GUIDE  06/07/2021  ? LEFT HEART CATH AND CORONARY ANGIOGRAPHY N/A 04/20/2021  ? Procedure: LEFT HEART CATH AND CORONARY  ANGIOGRAPHY;  Surgeon: Wellington Hampshire, MD;  Location: [facility];  Service: Cardiovascular;  Laterality: N/A;  ? TEE WITHOUT CARDIOVERSION N/A 05/08/2021  ? Procedure: TRANSESOPHAGEAL ECHOCARDIOGRAM (TEE);  Surgeon: Wonda Olds, MD;  Location: [facility];  Service: Open Heart Surgery;  Laterality: N/A;  ? TONSILLECTOMY    ? TONSILLECTOMY    ? VIDEO BRONCHOSCOPY Bilateral 07/26/2015  ? Procedure: VIDEO BRONCHOSCOPY WITH FLUORO;  Surgeon: [name], MD;  Location: [name];  Service: Cardiopulmonary;  Laterality: Bilateral;  ? VIDEO BRONCHOSCOPY WITH ENDOBRONCHIAL NAVIGATION N/A 08/23/2015  ? Procedure: VIDEO BRONCHOSCOPY WITH ENDOBRONCHIAL NAVIGATION;  Surgeon: Grace Isaac, MD;  Location: [facility];  Service: Thoracic;  Laterality: N/A;  ? VIDEO BRONCHOSCOPY WITH ENDOBRONCHIAL ULTRASOUND N/A 08/23/2015  ? Procedure: VIDEO BRONCHOSCOPY WITH ENDOBRONCHIAL ULTRASOUND;  Surgeon: Grace Isaac, MD;  Location: [facility];  Service: Thoracic;  Laterality: N/A;  ? ? ?Family History  ?Problem Relation Age of Onset  ? Leukemia Father   ? Emphysema Father   ? Learning disabilities Son   ? Atrial fibrillation Son   ? Leukemia Other   ? Stroke Other   ? ? ?Social History  ? ?Socioeconomic History  ? Marital status: Married  ?  Spouse name: Not on file  ? Number of children: 2  ? Years of education: Not on file  ? Highest education level: Not on file  ?Occupational History  ? Occupation: Retired  ?  Employer: [employer]  ?  Comment: truck driver  ?  Employer: [employer]  ?Tobacco Use  ? Smoking status: Former  ?  Packs/day: 1.00  ?  Years: 57.00  ?  Pack years: 57.00  ?  Types: Cigarettes, Cigars  ?  Quit date: 08/08/2015  ?  Years since quitting: 6.6  ? Smokeless tobacco: Former  ?  Types: Chew  ?  Quit date: 11/04/1958  ? Tobacco comments:  ?  Will smoke cigar every once in awhile  ?Vaping Use  ? Vaping Use: Former  ?Substance and Sexual Activity  ? Alcohol use: Not Currently  ?  Alcohol/week: 0.0 standard  drinks  ? Drug use: No  ? Sexual activity: Not Currently  ?Other Topics Concern  ? Not on file  ?Social History Narrative  ? Not on file  ? ?Social Determinants of Health  ? ?Financial Resource Strain: Medium Risk  ? Difficulty of Paying Living Expenses: Somewhat hard  ?Food Insecurity: No Food Insecurity  ? Worried About Charity fundraiser in the Last Year: Never true  ? Ran Out of Food in the Last Year: Never true  ?Transportation Needs: No Transportation Needs  ? Lack of Transportation (Medical): No  ? Lack of Transportation (Non-Medical): No  ?  Physical Activity: Insufficiently Active  ? Days of Exercise per Week: 2 days  ? Minutes of Exercise per Session: 40 min  ?Stress: No Stress Concern Present  ? Feeling of Stress : Not at all  ?Social Connections: Socially Isolated  ? Frequency of Communication with Friends and Family: Once a week  ? Frequency of Social Gatherings with Friends and Family: Once a week  ? Attends Religious Services: Never  ? Active Member of Clubs or Organizations: No  ? Attends Archivist Meetings: Never  ? Marital Status: Married  ?Intimate Partner Violence: Not At Risk  ? Fear of Current or Ex-Partner: No  ? Emotionally Abused: No  ? Physically Abused: No  ? Sexually Abused: No  ? ? ?Outpatient Medications Prior to Visit  ?Medication Sig Dispense Refill  ? acetaminophen (TYLENOL) 500 MG tablet Take 1-2 tablets (500-1,000 mg total) by mouth every 6 (six) hours as needed. 30 tablet 0  ? albuterol (PROVENTIL) (2.5 MG/3ML) 0.083% nebulizer solution Take 3 mLs (2.5 mg total) by nebulization every 6 (six) hours as needed for wheezing or shortness of breath. 75 mL 12  ? albuterol (VENTOLIN HFA) 108 (90 Base) MCG/ACT inhaler Inhale 2 puffs into the lungs every 6 (six) hours as needed for wheezing or shortness of breath. 8.5 g 5  ? amiodarone (PACERONE) 200 MG tablet Take 1 tablet (200 mg total) by mouth daily. 90 tablet 3  ? aspirin EC 81 MG tablet Take 1 tablet (81 mg total) by mouth  in the morning. Swallow whole.    ? atorvastatin (LIPITOR) 80 MG tablet Take 1 tablet (80 mg total) by mouth daily. 90 tablet 3  ? Budeson-Glycopyrrol-Formoterol (BREZTRI AEROSPHERE) 160-9-4.8 MCG/ACT AERO Inhal

## 2022-03-15 NOTE — Assessment & Plan Note (Signed)
Suspect incomplete bladder emptying due to BPH. He continues flomax. Will arrange follow up with his Urologist.   ?

## 2022-03-15 NOTE — Assessment & Plan Note (Addendum)
Asymptomatic. Management per cardiology. ?

## 2022-03-15 NOTE — Assessment & Plan Note (Signed)
Will arrange follow up with Urology for further evaluation. He did not follow through with recommended cystoscopy because he wanted to do it under anesthesia and his insurance would not pay for the anesthesia as an outpatient. Only if he was hospitalized.  ?

## 2022-03-15 NOTE — Assessment & Plan Note (Signed)
Unfortunately, we cannot use NSAIDS as he is on xarelto. Recommended tylenol prn. Obtain lumbar x-ray for further evaluation.  ?

## 2022-03-15 NOTE — Telephone Encounter (Signed)
Patient advised of [place] and follow up with nurologist needed. He verbalized understanding.  ?

## 2022-03-15 NOTE — Assessment & Plan Note (Signed)
Clinically stable. Continues lasix as tolerated. 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   ?

## 2022-03-15 NOTE — Assessment & Plan Note (Signed)
Clinically stable. Due for follow up US.  ?

## 2022-03-15 NOTE — Telephone Encounter (Signed)
Please advise pt that I would like to have him repeat his abdominal ultrasound to look at his small aortic aneuysm and make sure it has not grown. I also would like to get him back in with his neurologist for follow up. ?

## 2022-03-15 NOTE — Assessment & Plan Note (Signed)
Rate stable, continue xarelto/cardiology follow up.  ?

## 2022-03-16 LAB — URINE CULTURE
MICRO NUMBER:: 13388941
Result:: NO GROWTH
SPECIMEN QUALITY:: ADEQUATE

## 2022-04-02 ENCOUNTER — Other Ambulatory Visit (HOSPITAL_BASED_OUTPATIENT_CLINIC_OR_DEPARTMENT_OTHER): Payer: Self-pay

## 2022-04-03 ENCOUNTER — Ambulatory Visit (HOSPITAL_BASED_OUTPATIENT_CLINIC_OR_DEPARTMENT_OTHER)
Admission: RE | Admit: 2022-04-03 | Discharge: 2022-04-03 | Disposition: A | Discharge status: Home / Self Care | Payer: Medicare Other | Source: Ambulatory Visit | Attending: Family | Admitting: Family

## 2022-04-03 DIAGNOSIS — I714 Abdominal aortic aneurysm, without rupture, unspecified: Secondary | ICD-10-CM | POA: Insufficient documentation

## 2022-04-04 DIAGNOSIS — I719 Aortic aneurysm of unspecified site, without rupture: Secondary | ICD-10-CM | POA: Insufficient documentation

## 2022-04-04 DIAGNOSIS — I712 Thoracic aortic aneurysm, without rupture, unspecified: Secondary | ICD-10-CM | POA: Insufficient documentation

## 2022-04-04 NOTE — Progress Notes (Unsigned)
Cardiology Office Note:    Date:  04/05/2022   ID:  Robert Bates, DOB 03-Mar-1941, MRN [id number]  PCP:  [name], NP  [facility] Providers Cardiologist:  Mertie Moores, MD Cardiology APP:  [facility]     Referring MD: [name], NP   Chief Complaint:  Follow-up for CHF, atrial fibrillation, CAD    Patient Profile: Coronary artery disease S/p CABG 05/2021 (L-LAD, S-OM, S-Dx; LA clipping) C/b  blood loss anemia requiring transfusion, atrial fibrillation with rapid rate and left recurrent pleural effusion - thoracentesis x 2  Paroxysmal atrial fibrillation  S/p L atral clipping 05/2021 Amiodarone; Xarelto  (HFpEF) heart failure with preserved ejection fraction  Lung CA, Stage IIB ([facility]) COPD Hypertension  Hyperlipidemia  Borderline diabetes mellitus  Peripheral arterial disease  Prior ETOH abuse  S/p cholecystectomy  Aortic atherosclerosis  Thoracic aortic aneurysm CT 6/22: 3.9 cm  Echocardiogram 1/23: 3.6 cm  Abdominal aortic aneurysm  [place] 5/23: 3.2 cm   Prior CV Studies: US AORTA 04/03/2022 IMPRESSION: Stable 3.2 cm abdominal aortic aneurysm. Recommend follow-up every 3 years. Reference: J Am Coll Radiol 6144;31:[id number].  ECHO COMPLETE WO IMAGING ENHANCING AGENT 11/20/2021 EF 55-60, no RWMA, GRII DD, normal RVSF, mildly elevated PASP, moderate LAE, mild RAE, pericardial effusion, mild MR, mild to moderate TR  Pre-CABG [place] 04/25/21 Bilateral ICA 1-39   Echocardiogram 04/22/21 EF 60-65, no RWMA, GRII DD, mildly reduced RVSF, RVSP 41, trivial MR, mild dilation of the ascending aorta (40 mm)   LEFT HEART CATH AND CORONARY ANGIOGRAPHY 04/20/2021  Mid LM to Dist LM lesion is 70% stenosed.  Ost Cx to Prox Cx lesion is 70% stenosed.  Mid Cx lesion is 90% stenosed.  3rd Mrg lesion is 100% stenosed.  Ost LAD to Prox LAD lesion is 80% stenosed.  1st Diag lesion is 70% stenosed.  Ramus lesion is 60% stenosed.   CT CORONARY MORPH W/CTA  COR W/SCORE 04/10/2021 IMPRESSION: 1. Coronary calcium score of 1231. This was 76th percentile for age and sex matched control. 2.  Normal coronary origin with right dominance. 3. Severe atherosclerosis involving the moderate disease of the mid to distal LM and severe atherosclerosis in the LAD and LCx. CAD RADS 4. ascending thoracic aorta measuring approximately 3.9 cm in greatest diameter.   Echo 07/22/17 EF 55-60, no RWMA, Gr 2 DD, mild RAE, PASP 38  History of Present Illness:   Robert Bates is a 81 y.o. male with the above problem list.  He was last seen by Robert Bates in Dec 2022.  He returns for f/u.  He is here alone.  Overall, he is doing well without chest pain, syncope, orthopnea.  He does have some lower extremity edema today.  He notes some increase in his shortness of breath recently.  Weights have been stable.        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   COPD (chronic obstructive pulmonary disease) ([facility])    Coronary artery disease    Diabetes type 2, controlled ([place]) 07/31/2017   Dyspnea    Dysrhythmia    a fib   GERD (gastroesophageal reflux disease)    Hematuria    refuses work up or referral - understands risks of morbidity / mortality - 11/2008, 12/2008   History of hiatal hernia  History of kidney stones    Hyperlipemia    Meningioma ([place]) 10/25/2013   Follows with Dr. [name].    Peripheral vascular disease ([place])    Abdominal Aortic Aneursym   Pneumonia    as a child   Radiation 09/18/15-10/25/15   left lower lobe 70.2 Gy   Seizures ([place]) 02/18/2020   Tobacco abuse    Current Medications: Current Meds  Medication Sig   acetaminophen (TYLENOL) 500 MG tablet Take 1-2 tablets (500-1,000 mg total) by mouth every 6 (six) hours as needed.   albuterol (PROVENTIL) (2.5 MG/3ML) 0.083% nebulizer  solution Take 3 mLs (2.5 mg total) by nebulization every 6 (six) hours as needed for wheezing or shortness of breath.   albuterol (VENTOLIN HFA) 108 (90 Base) MCG/ACT inhaler Inhale 2 puffs into the lungs every 6 (six) hours as needed for wheezing or shortness of breath.   amiodarone (PACERONE) 200 MG tablet Take 1 tablet (200 mg total) by mouth daily.   atorvastatin (LIPITOR) 80 MG tablet Take 1 tablet (80 mg total) by mouth daily.   furosemide (LASIX) 20 MG tablet TAKE 2 TABLETS BY MOUTH DAILY FOR 2 DAYS THEN REDUCE TO 1 TABLET BY MOUTH DAILY   gabapentin (NEURONTIN) 300 MG capsule Take 1 capsule (300 mg total) by mouth 3 (three) times daily.   Multiple Vitamin (MULTIVITAMIN WITH MINERALS) TABS tablet Take 1 tablet by mouth daily in the afternoon.   omeprazole (PRILOSEC) 40 MG capsule Take 1 capsule (40 mg total) by mouth daily.   potassium chloride (KLOR-CON) 10 MEQ tablet TAKE 2 TABLETS BY MOUTH DAILY FOR 2 DAYS THEN REDUCE TO 1 TABLET BY MOUTH DAILY   rivaroxaban (XARELTO) 20 MG TABS tablet TAKE 1 TABLET BY MOUTH DAILY WITH SUPPER   tamsulosin (FLOMAX) 0.4 MG CAPS capsule TAKE 1 CAPSULE (0.4 MG TOTAL) BY MOUTH DAILY.   [DISCONTINUED] furosemide (LASIX) 20 MG tablet Take 1 tablet (20 mg total) by mouth daily. (Patient taking differently: Take 20 mg by mouth every other day.)   [DISCONTINUED] potassium chloride (KLOR-CON) 10 MEQ tablet Take 1 tablet (10 mEq total) by mouth daily.    Allergies:   Iodine and Iohexol   Social History   Tobacco Use   Smoking status: Former    Packs/day: 1.00    Years: 57.00    Pack years: 57.00    Types: Cigarettes, Cigars    Quit date: 08/08/2015    Years since quitting: 6.6   Smokeless tobacco: Former    Types: Chew    Quit date: 11/04/1958   Tobacco comments:    Will smoke cigar every once in awhile  Vaping Use   Vaping Use: Former  Substance Use Topics   Alcohol use: Not Currently    Alcohol/week: 0.0 standard drinks   Drug use: No    Family  Hx: The patient's family history includes Atrial fibrillation in his son; Emphysema in his father; Learning disabilities in his son; Leukemia in his father and another family member; Stroke in an other family member.  ROS   EKGs/Labs/Other Test Reviewed:    EKG:  EKG is not ordered today.  The ekg ordered today demonstrates n/a  Recent Labs: 05/20/2021: Magnesium 2.0 06/14/2021: ALT 13; Hemoglobin 10.4; Platelet Count 247 08/03/2021: TSH 4.26 01/15/2022: BUN 25; Creatinine, Ser 0.96; Potassium 3.8; Sodium 139   Recent Lipid Panel Recent Labs    07/13/21 1336  CHOL 140  TRIG 72.0  HDL 67.00  VLDL 14.4  LDLCALC 58  Risk Assessment/Calculations:    CHA2DS2-VASc Score = 5   This indicates a 7.2% annual risk of stroke. The patient's score is based upon: CHF History: 1 HTN History: 1 Diabetes History: 0 Stroke History: 0 Vascular Disease History: 1 Age Score: 2 Gender Score: 0        Physical Exam:    VS:  BP 120/70   Pulse (!) 58   Ht _0  (1.702 m)   Wt 181 lb (82.1 kg)   BMI 28.35 kg/m     Wt Readings from Last 3 Encounters:  04/05/22 181 lb (82.1 kg)  03/15/22 181 lb 8 oz (82.3 kg)  01/15/22 173 lb (78.5 kg)    Physical Exam      ASSESSMENT & PLAN:   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HCC) 3.2 cm by ultrasound in May 2023.  Recommendation is to repeat in 3 years.  Blood pressure is well controlled.  Chronic heart failure with preserved ejection fraction (HCC) EF 55-60 by echocardiogram January 2023.  He is NYHA IIb.  He does have some volume excess on exam today.  I will increase his furosemide to 40 mg daily for 3 days as well as his K+ to 20 mEq daily for 3 days.  He will then resume furosemide 20 mg daily and K+ 10 mEq daily.  He knows to contact me if he needs to remain on higher dose furosemide for longer period if he continues to have issues with volume excess, we can consider SGLT2 inhibitor versus MRA.  Follow-up 6 months.  Coronary artery disease Status  post CABG in July 2022.  He is doing well without anginal symptoms.  Continue Lipitor 80 mg daily.  He is not on aspirin as he is on rivaroxaban.  Follow-up in 6 months.  Essential hypertension The patient's blood pressure is controlled on his current regimen.  Continue current therapy.   Hyperlipidemia LDL goal <70 LDL 58 in September 2022.  Continue Lipitor 80 mg daily.  PAF (paroxysmal atrial fibrillation) (H[facility]) Maintaining sinus rhythm on exam.  He does have COPD.  He has been on amiodarone since his bypass surgery.  Obtain follow-up CMET, TSH today.  I reviewed with Robert Bates today after the patient left the office.  We will decrease his dose of amiodarone to 100 mg daily.  He does get annual CT scans with Dr. Earlie Server for follow-up of his lung cancer (next scheduled in August 2023).  Creatinine clearance is 71.  Continue rivaroxaban 20 mg daily.  Thoracic aortic aneurysm ([facility]CC) 3.9 cm on CT in June 2022.  However, echo in January 2023 demonstrated an aorta size 3.6 cm.  He can likely get another echocardiogram in 1 to 2 years for follow-up.  ATHEROSCLEROSIS OF AORTA Continue statin.  COPD GOLD II  Followed by Dr. Shearon Stalls with pulmonology.  Lung cancer ([place]) Followed by Dr. Earlie Server.           Dispo:  Return in about 6 months (around 10/05/2022) for Routine Follow Up, w/ Robert Bates.   Medication Adjustments/Labs and Tests Ordered: Current medicines are reviewed at length with the patient today.  Concerns regarding medicines are outlined above.  Tests Ordered: Orders Placed This Encounter  Procedures   Comp Met (CMET)   TSH   Medication Changes: Meds ordered this encounter  Medications   furosemide (LASIX) 20 MG tablet    Sig: TAKE 2 TABLETS BY MOUTH DAILY FOR 2 DAYS THEN REDUCE TO 1 TABLET BY MOUTH DAILY    Dispense:  92 tablet    Refill:  3   potassium chloride (KLOR-CON) 10 MEQ tablet    Sig: TAKE 2 TABLETS BY MOUTH DAILY FOR 2 DAYS THEN REDUCE TO 1 TABLET BY MOUTH  DAILY    Dispense:  92 tablet    Refill:  3   Signed, Richardson Dopp, PA-C  04/05/2022 11:30 AM    [facility] [address], [place], [place]  [address] Phone: [phone]; Fax: [phone]

## 2022-04-05 ENCOUNTER — Other Ambulatory Visit (HOSPITAL_BASED_OUTPATIENT_CLINIC_OR_DEPARTMENT_OTHER): Payer: Self-pay

## 2022-04-05 ENCOUNTER — Ambulatory Visit: Payer: Medicare Other | Admitting: Physician Assistant

## 2022-04-05 ENCOUNTER — Telehealth: Payer: Self-pay | Admitting: *Deleted

## 2022-04-05 ENCOUNTER — Encounter: Payer: Self-pay | Admitting: Physician Assistant

## 2022-04-05 VITALS — BP 120/70 | HR 58 | Ht 67.0 in | Wt 181.0 lb

## 2022-04-05 DIAGNOSIS — I5032 Chronic diastolic (congestive) heart failure: Secondary | ICD-10-CM | POA: Diagnosis not present

## 2022-04-05 DIAGNOSIS — I7121 Aneurysm of the ascending aorta, without rupture: Secondary | ICD-10-CM

## 2022-04-05 DIAGNOSIS — I10 Essential (primary) hypertension: Secondary | ICD-10-CM

## 2022-04-05 DIAGNOSIS — J449 Chronic obstructive pulmonary disease, unspecified: Secondary | ICD-10-CM | POA: Diagnosis not present

## 2022-04-05 DIAGNOSIS — I2510 Atherosclerotic heart disease of native coronary artery without angina pectoris: Secondary | ICD-10-CM

## 2022-04-05 DIAGNOSIS — I700 Atherosclerosis of aorta: Secondary | ICD-10-CM | POA: Diagnosis not present

## 2022-04-05 DIAGNOSIS — E785 Hyperlipidemia, unspecified: Secondary | ICD-10-CM

## 2022-04-05 DIAGNOSIS — I714 Abdominal aortic aneurysm, without rupture, unspecified: Secondary | ICD-10-CM

## 2022-04-05 DIAGNOSIS — I480 Paroxysmal atrial fibrillation: Secondary | ICD-10-CM

## 2022-04-05 DIAGNOSIS — C3490 Malignant neoplasm of unspecified part of unspecified bronchus or lung: Secondary | ICD-10-CM

## 2022-04-05 LAB — COMPREHENSIVE METABOLIC PANEL
ALT: 12 IU/L (ref 0–44)
AST: 9 IU/L (ref 0–40)
Albumin/Globulin Ratio: 1.9 (ref 1.2–2.2)
Albumin: 3.9 g/dL (ref 3.7–4.7)
Alkaline Phosphatase: 102 IU/L (ref 44–121)
BUN/Creatinine Ratio: 14 (ref 10–24)
BUN: 13 mg/dL (ref 8–27)
Bilirubin Total: 0.3 mg/dL (ref 0.0–1.2)
CO2: 26 mmol/L (ref 20–29)
Calcium: 9 mg/dL (ref 8.6–10.2)
Chloride: 103 mmol/L (ref 96–106)
Creatinine, Ser: 0.91 mg/dL (ref 0.76–1.27)
Globulin, Total: 2.1 g/dL (ref 1.5–4.5)
Glucose: 109 mg/dL — ABNORMAL HIGH (ref 70–99)
Potassium: 4 mmol/L (ref 3.5–5.2)
Sodium: 141 mmol/L (ref 134–144)
Total Protein: 6 g/dL (ref 6.0–8.5)
eGFR: 85 mL/min/{1.73_m2} (ref >59)

## 2022-04-05 LAB — TSH: TSH: 3.62 u[IU]/mL (ref 0.450–4.500)

## 2022-04-05 MED ORDER — AMIODARONE HCL 200 MG PO TABS
100.0000 mg | ORAL_TABLET | Freq: Every day | ORAL | 3 refills | Status: DC
  Filled 2022-04-05: qty 45, 90d supply, fill #0

## 2022-04-05 MED ORDER — FUROSEMIDE 20 MG PO TABS
ORAL_TABLET | ORAL | 3 refills | Status: DC
  Filled 2022-04-05: qty 92, 90d supply, fill #0

## 2022-04-05 MED ORDER — POTASSIUM CHLORIDE ER 10 MEQ PO TBCR
EXTENDED_RELEASE_TABLET | ORAL | 3 refills | Status: DC
  Filled 2022-04-05: qty 92, fill #0
  Filled 2022-09-19: qty 92, 90d supply, fill #0
  Filled 2023-01-23: qty 90, 90d supply, fill #1

## 2022-04-05 NOTE — Assessment & Plan Note (Signed)
The patient's blood pressure is controlled on his current regimen.  Continue current therapy.  

## 2022-04-05 NOTE — Assessment & Plan Note (Signed)
LDL 58 in September 2022.  Continue Lipitor 80 mg daily.

## 2022-04-05 NOTE — Assessment & Plan Note (Addendum)
>>  ASSESSMENT AND PLAN FOR ABDOMINAL AORTIC ANEURYSM (HCC) WRITTEN ON 04/05/2022 11:17 AM BY Klani Caridi T, PA-C  3.2 cm by ultrasound in May 2023.  Recommendation is to repeat in 3 years.  Blood pressure is well controlled.   >>ASSESSMENT AND PLAN FOR AORTIC ANEURYSM (HCC) WRITTEN ON 04/05/2022 11:24 AM BY Tayden Duran T, PA-C  3.9 cm on CT in June 2022.  However, echo in January 2023 demonstrated an aorta size 3.6 cm.  He can likely get another echocardiogram in 1 to 2 years for follow-up.

## 2022-04-05 NOTE — Assessment & Plan Note (Signed)
Followed by Dr. Earlie Server.

## 2022-04-05 NOTE — Assessment & Plan Note (Signed)
Followed by Dr. Shearon Stalls with pulmonology.

## 2022-04-05 NOTE — Assessment & Plan Note (Addendum)
Maintaining sinus rhythm on exam.  He does have COPD.  He has been on amiodarone since his bypass surgery.  Obtain follow-up CMET, TSH today.  I reviewed with Dr. Acie Fredrickson today after the patient left the office.  We will decrease his dose of amiodarone to 100 mg daily.  He does get annual CT scans with Dr. Earlie Server for follow-up of his lung cancer (next scheduled in August 2023).  Creatinine clearance is 71.  Continue rivaroxaban 20 mg daily.

## 2022-04-05 NOTE — Assessment & Plan Note (Signed)
>>  ASSESSMENT AND PLAN FOR ATRIAL FIBRILLATION (HCC) WRITTEN ON 04/05/2022 11:27 AM BY WEAVER, SCOTT T, PA-C  Maintaining sinus rhythm on exam.  He does have COPD.  He has been on amiodarone since his bypass surgery.  Obtain follow-up CMET, TSH today.  I reviewed with Dr. Elease Hashimoto today after the patient left the office.  We will decrease his dose of amiodarone to 100 mg daily.  He does get annual CT scans with Dr. Shirline Frees for follow-up of his lung cancer (next scheduled in August 2023).  Creatinine clearance is 71.  Continue rivaroxaban 20 mg daily.

## 2022-04-05 NOTE — Telephone Encounter (Signed)
-----   Message from Liliane Shi, [place] sent at 04/05/2022 11:21 AM EDT ----- Regarding: Decrease amiodarone to 100 mg daily Tell patient I reviewed with Dr. Acie Fredrickson.  He can decrease his amiodarone to 100 mg daily. Thanks [employer]

## 2022-04-05 NOTE — Assessment & Plan Note (Addendum)
3.9 cm on CT in June 2022.  However, echo in January 2023 demonstrated an aorta size 3.6 cm.  He can likely get another echocardiogram in 1 to 2 years for follow-up.

## 2022-04-05 NOTE — Patient Instructions (Signed)
Medication Instructions:  Your physician has recommended you make the following change in your medication:   INCREASE the Lasix to 20 mg taking 2 tablets daily X's 2 days then reduce it to 1 tablet daily  INCREASE the Potassium 10 meq taking 2 tablets daily X's 2 days then reduce it to 1 daily   *If you need a refill on your cardiac medications before your next appointment, please call your pharmacy*   Lab Work: TODAY:  CMET & TSH  If you have labs (blood work) drawn today and your tests are completely normal, you will receive your results only by: [place] (if you have MyChart) OR A paper copy in the mail If you have any lab test that is abnormal or we need to change your treatment, we will call you to review the results.   Testing/Procedures: None ordered   Follow-Up: At [facility], you and your health needs are our priority.  As part of our continuing mission to provide you with exceptional heart care, we have created designated Provider Care Teams.  These Care Teams include your primary Cardiologist (physician) and Advanced Practice Providers (APPs -  Physician Assistants and Nurse Practitioners) who all work together to provide you with the care you need, when you need it.  We recommend signing up for the patient portal called "MyChart".  Sign up information is provided on this After Visit Summary.  MyChart is used to connect with patients for Virtual Visits (Telemedicine).  Patients are able to view lab/test results, encounter notes, upcoming appointments, etc.  Non-urgent messages can be sent to your provider as well.   To learn more about what you can do with MyChart, go to [email or web address].    Your next appointment:   6 month(s)  10/10/22 ARRIVE AT 10:45  The format for your next appointment:   In Person  Provider:   Mertie Moores, MD  or Richardson Dopp, PA-C         Other Instructions   Important Information About Sugar

## 2022-04-05 NOTE — Assessment & Plan Note (Addendum)
Continue statin. 

## 2022-04-05 NOTE — Assessment & Plan Note (Signed)
EF 55-60 by echocardiogram January 2023.  He is NYHA IIb.  He does have some volume excess on exam today.  I will increase his furosemide to 40 mg daily for 3 days as well as his K+ to 20 mEq daily for 3 days.  He will then resume furosemide 20 mg daily and K+ 10 mEq daily.  He knows to contact me if he needs to remain on higher dose furosemide for longer period if he continues to have issues with volume excess, we can consider SGLT2 inhibitor versus MRA.  Follow-up 6 months.

## 2022-04-05 NOTE — Assessment & Plan Note (Addendum)
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  He is doing well without anginal symptoms.  Continue Lipitor 80 mg daily.  He is not on aspirin as he is on rivaroxaban.  Follow-up in 6 months.

## 2022-04-05 NOTE — Telephone Encounter (Signed)
Call placed to pt regarding message below, he has been made aware to reduce the Amiodarone to 100 mg daily. New prescription sent to [place], [facility], per pt request.

## 2022-04-10 NOTE — Progress Notes (Signed)
Pt has been made aware of normal result and verbalized understanding.  jw

## 2022-04-10 NOTE — Telephone Encounter (Signed)
error 

## 2022-04-12 ENCOUNTER — Encounter: Payer: Self-pay | Admitting: Neurology

## 2022-04-12 ENCOUNTER — Ambulatory Visit: Payer: Medicare Other | Admitting: Neurology

## 2022-04-12 ENCOUNTER — Other Ambulatory Visit (HOSPITAL_BASED_OUTPATIENT_CLINIC_OR_DEPARTMENT_OTHER): Payer: Self-pay

## 2022-04-12 VITALS — BP 149/82 | HR 57 | Ht 69.0 in | Wt 178.5 lb

## 2022-04-12 DIAGNOSIS — R569 Unspecified convulsions: Secondary | ICD-10-CM | POA: Diagnosis not present

## 2022-04-12 MED ORDER — LEVETIRACETAM ER 750 MG PO TB24
750.0000 mg | ORAL_TABLET | Freq: Every evening | ORAL | 11 refills | Status: DC
  Filled 2022-04-12: qty 30, 30d supply, fill #0
  Filled 2022-05-08: qty 30, 30d supply, fill #1
  Filled 2022-06-19: qty 30, 30d supply, fill #2
  Filled 2022-07-25: qty 30, 30d supply, fill #3
  Filled 2022-09-02: qty 30, 30d supply, fill #4
  Filled 2022-10-04: qty 30, 30d supply, fill #5
  Filled 2022-11-18 – 2022-11-20 (×2): qty 30, 30d supply, fill #6

## 2022-04-12 NOTE — Patient Instructions (Signed)
Start with Keppra 750 mg nightly  Return in 6 months

## 2022-04-12 NOTE — Progress Notes (Addendum)
PATIENT: Robert Bates DOB: 1941-01-03  REASON FOR VISIT: follow up HISTORY FROM: patient  HISTORY OF PRESENT ILLNESS: Today 04/12/22: Patient presents today for follow-up, at last visit in October 2021 plan was to switch patient to Keppra extended release due to side effect of the regular Keppra twice daily.  He reported he took the medication but since running out has not refilled the medication.  Currently he is not taking any antiseizure medication and has not had any additional seizures.  He still works as a Administrator, reported he had some issue at work, he went to the wrong drop off location, sometimes take the wrong turn but is able to catch himself and put himself back on the right track.  He denies being forgetful, he still independent in all ADLs and IADLs.  Denies any new complaint other than he had open heart surgery this year.    INTERVAL HISTORY 08/23/2020 Robert Bates is a 81 year old male with a history of seizures.  He returns today for follow-up.  He remains on Keppra 500 mg twice a day.  He denies any seizure events.  Reports that he started driving last month after he had been seizure-free for 6 months.  He states that with the Twining he has noticed more drowsiness throughout the day.  Returns today for an evaluation.  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  There is some mass-effect on the cortex of the brain.  The patient works as a Administrator, on 14 January 8501 he was at an establishment gambling.  He indicates that he only had a cup of coffee that day, he had not eaten.  He had urinary urgency and sensation of wanting to have a bowel movement but he delayed this.  He is amnestic for the seizure-like event, he remembers waking up with EMS present.  The patient apparently had witnessed jerking of the extremities.  The patient did have urinary incontinence,  he did not have bowel incontinence or tongue biting.  He has never had a similar event previously.  He had a mild headache following the episode, but generally he does not have headaches.  He has not had a lot of muscle soreness following the event.  The patient denies any focal numbness or weakness of the face, arms, legs.  He denies any vision problems or speech or swallowing problems.  He denies issues controlling the bowels or the bladder.  He claims he does not drink alcohol and does not use illicit drugs.  The patient has no prior history of a concussion or closed head injury and there is no family history of seizures.  The patient was placed on Keppra and he will was referred to this office.  His potassium level was 3.0 initially in the ER.   REVIEW OF SYSTEMS: Out of a complete 14 system review of symptoms, the patient complains only of the following symptoms, and all other reviewed systems are negative.  See HPI  ALLERGIES: Allergies  Allergen Reactions   Iodine Other (See Comments)    neck swells   Iohexol Swelling    Neck and gland swelling per patient.    HOME MEDICATIONS: Outpatient Medications Prior to Visit  Medication Sig Dispense Refill   acetaminophen (TYLENOL) 500 MG tablet Take 1-2 tablets (500-1,000 mg total) by mouth every 6 (six) hours as needed. 30 tablet 0  albuterol (PROVENTIL) (2.5 MG/3ML) 0.083% nebulizer solution Take 3 mLs (2.5 mg total) by nebulization every 6 (six) hours as needed for wheezing or shortness of breath. 75 mL 12   albuterol (VENTOLIN HFA) 108 (90 Base) MCG/ACT inhaler Inhale 2 puffs into the lungs every 6 (six) hours as needed for wheezing or shortness of breath. 8.5 g 5   amiodarone (PACERONE) 200 MG tablet Take 1/2 tablet (100 mg total) by mouth daily. 45 tablet 3   atorvastatin (LIPITOR) 80 MG tablet Take 1 tablet (80 mg total) by mouth daily. 90 tablet 3   Budeson-Glycopyrrol-Formoterol (BREZTRI AEROSPHERE) 160-9-4.8 MCG/ACT AERO Inhale 2  puffs into the lungs in the morning and at bedtime. 10.7 g 5   furosemide (LASIX) 20 MG tablet TAKE 2 TABLETS BY MOUTH DAILY FOR 2 DAYS THEN REDUCE TO 1 TABLET BY MOUTH DAILY 92 tablet 3   gabapentin (NEURONTIN) 300 MG capsule Take 1 capsule (300 mg total) by mouth 3 (three) times daily. 90 capsule 1   Multiple Vitamin (MULTIVITAMIN WITH MINERALS) TABS tablet Take 1 tablet by mouth daily in the afternoon.     omeprazole (PRILOSEC) 40 MG capsule Take 1 capsule (40 mg total) by mouth daily. 90 capsule 1   potassium chloride (KLOR-CON) 10 MEQ tablet TAKE 2 TABLETS BY MOUTH DAILY FOR 2 DAYS THEN REDUCE TO 1 TABLET BY MOUTH DAILY 92 tablet 3   rivaroxaban (XARELTO) 20 MG TABS tablet TAKE 1 TABLET BY MOUTH DAILY WITH SUPPER 30 tablet 5   tamsulosin (FLOMAX) 0.4 MG CAPS capsule TAKE 1 CAPSULE (0.4 MG TOTAL) BY MOUTH DAILY. 90 capsule 1   No facility-administered medications prior to visit.    PAST MEDICAL HISTORY: Past Medical History:  Diagnosis Date   Anxiety    Arthritis    Cancer (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   COPD (chronic obstructive pulmonary disease) (HCC)    Coronary artery disease    Diabetes type 2, controlled ([place]) 07/31/2017   Dyspnea    Dysrhythmia    a fib   GERD (gastroesophageal reflux disease)    Hematuria    refuses work up or referral - understands risks of morbidity / mortality - 11/2008, 12/2008   History of hiatal hernia    History of kidney stones    Hyperlipemia    Meningioma ([place]) 10/25/2013   Follows with Dr. [name].    Peripheral vascular disease ([place])    Abdominal Aortic Aneursym   Pneumonia    as a child   Radiation 09/18/15-10/25/15   left lower lobe 70.2 Gy   Seizures ([place]) 02/18/2020   Tobacco abuse     PAST SURGICAL HISTORY: Past Surgical History:  Procedure Laterality Date   CHOLECYSTECTOMY N/A 07/23/2017   Procedure: LAPAROSCOPIC CHOLECYSTECTOMY;   Surgeon: Mickeal Skinner, MD;  Location: [facility];  Service: General;  Laterality: N/A;   CLIPPING OF ATRIAL APPENDAGE Left 05/08/2021   Procedure: CLIPPING OF ATRIAL APPENDAGE USING 42 ATRICLIP;  Surgeon: Wonda Olds, MD;  Location: [place];  Service: Open Heart Surgery;  Laterality: Left;   COLONOSCOPY     CORONARY ARTERY BYPASS GRAFT N/A 05/08/2021   Procedure: CORONARY ARTERY BYPASS GRAFTING (CABG)X 3 USING LEFT INTERNAL MAMMARY ARTERY AND RIGHT GREATER SAPEHNOUS VEIN;  Surgeon: Wonda Olds, MD;  Location: [place];  Service: Open Heart Surgery;  Laterality: N/A;   ENDOVEIN HARVEST OF GREATER SAPHENOUS  VEIN Right 05/08/2021   Procedure: ENDOVEIN HARVEST OF GREATER SAPHENOUS VEIN;  Surgeon: Wonda Olds, MD;  Location: [facility];  Service: Open Heart Surgery;  Laterality: Right;   EYE SURGERY Bilateral    Cataracts removed w/ lens implant   HERNIA REPAIR     Left 36 years ago . Right inguinal hernia repair 10-01-17 Dr. Kieth Brightly   INGUINAL HERNIA REPAIR Right 10/01/2017   Procedure: RIGHT INGUINAL HERNIA REPAIR WITH MESH;  Surgeon: Kinsinger, Arta Bruce, MD;  Location: [facility];  Service: General;  Laterality: Right;  TAP BLOCK   INSERTION OF MESH Right 10/01/2017   Procedure: INSERTION OF MESH;  Surgeon: Kieth Brightly Arta Bruce, MD;  Location: [facility];  Service: General;  Laterality: Right;   IR THORACENTESIS ASP PLEURAL SPACE W/IMG GUIDE  05/18/2021   IR THORACENTESIS ASP PLEURAL SPACE W/IMG GUIDE  06/07/2021   LEFT HEART CATH AND CORONARY ANGIOGRAPHY N/A 04/20/2021   Procedure: LEFT HEART CATH AND CORONARY ANGIOGRAPHY;  Surgeon: Wellington Hampshire, MD;  Location: [place] [facility];  Service: Cardiovascular;  Laterality: N/A;   TEE WITHOUT CARDIOVERSION N/A 05/08/2021   Procedure: TRANSESOPHAGEAL ECHOCARDIOGRAM (TEE);  Surgeon: Wonda Olds, MD;  Location: [place];  Service: Open Heart Surgery;  Laterality: N/A;   TONSILLECTOMY     TONSILLECTOMY     VIDEO BRONCHOSCOPY Bilateral 07/26/2015    Procedure: VIDEO BRONCHOSCOPY WITH FLUORO;  Surgeon: [name], MD;  [facility];  Service: Cardiopulmonary;  Laterality: Bilateral;   VIDEO BRONCHOSCOPY WITH ENDOBRONCHIAL NAVIGATION N/A 08/23/2015   Procedure: VIDEO BRONCHOSCOPY WITH ENDOBRONCHIAL NAVIGATION;  Surgeon: Grace Isaac, MD;  [facility];  Service: Thoracic;  Laterality: N/A;   VIDEO BRONCHOSCOPY WITH ENDOBRONCHIAL ULTRASOUND N/A 08/23/2015   Procedure: VIDEO BRONCHOSCOPY WITH ENDOBRONCHIAL ULTRASOUND;  Surgeon: Grace Isaac, MD;  Location[facility];  Service: Thoracic;  Laterality: N/A;    FAMILY HISTORY: Family History  Problem Relation Age of Onset   Leukemia Father    Emphysema Father    Learning disabilities Son    Atrial fibrillation Son    Leukemia Other    Stroke Other     SOCIAL HISTORY: Social History   Socioeconomic History   Marital status: Married    Spouse name: Not on file   Number of children: 2   Years of education: Not on file   Highest education level: Not on file  Occupational History   Occupation: Retired    Fish farm manager: [employer]    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[place] Use   Smoking status: Former    Packs/day: 1.00    Years: 57.00    Total pack years: 57.00    Types: Cigarettes, Cigars    Quit date: 08/08/2015    Years since quitting: 6.6   Smokeless tobacco: Former    Types: Chew    Quit date: 11/04/1958   Tobacco comments:    Will smoke cigar every once in awhile  Vaping Use   Vaping Use: Former  Substance and Sexual Activity   Alcohol use: Not Currently    Alcohol/week: 0.0 standard drinks of alcohol   Drug use: No   Sexual activity: Not Currently  Other Topics Concern   Not on file  Social History Narrative   Not on file   Social Determinants of Health   Financial Resource Strain: Medium Risk (11/22/2021)   Overall Financial Resource Strain (CARDIA)    Difficulty of Paying Living Expenses: Somewhat hard  Food  Insecurity: No Food Insecurity (09/25/2021)   Hunger Vital Sign    Worried About Running Out of Food in the Last Year: Never true    Ran Out of Food in the Last Year: Never true  Transportation Needs: No Transportation Needs (06/29/2021)   PRAPARE - Hydrologist (Medical): No    Lack of Transportation (Non-Medical): No  Physical Activity: Insufficiently Active (08/05/2021)   Exercise Vital Sign    Days of Exercise per Week: 2 days    Minutes of Exercise per Session: 40 min  Stress: No Stress Concern Present (10/01/2021)   [place]    Feeling of Stress : Not at all  Social Connections: Socially Isolated (10/01/2021)   Social Connection and Isolation Panel [NHANES]    Frequency of Communication with Friends and Family: Once a week    Frequency of Social Gatherings with Friends and Family: Once a week    Attends Religious Services: Never    Marine scientist or Organizations: No    Attends Archivist Meetings: Never    Marital Status: Married  Human resources officer Violence: Not At Risk (10/01/2021)   Humiliation, Afraid, Rape, and Kick questionnaire    Fear of Current or Ex-Partner: No    Emotionally Abused: No    Physically Abused: No    Sexually Abused: No      PHYSICAL EXAM  Vitals:   04/12/22 1000  BP: (!) 149/82  Pulse: (!) 57  Weight: 178 lb 8 oz (81 kg)  Height: 5\' 9"  (1.753 m)    Body mass index is 26.36 kg/m.  Generalized: Well developed, in no acute distress   Neurological examination  Mentation: Alert oriented to time, place, history taking. Follows all commands speech and language fluent Cranial nerve II-XII: Pupils were equal round reactive to light. Extraocular movements were full, visual field were full on confrontational test. Head turning and shoulder shrug  were normal and symmetric. Motor: The motor testing reveals 5 over 5 strength of all 4  extremities. Good symmetric motor tone is noted throughout.  Sensory: Sensory testing is intact to soft touch on all 4 extremities. No evidence of extinction is noted.  Coordination: Cerebellar testing reveals good finger-nose-finger and heel-to-shin bilaterally.  Gait and station: Gait is normal.  Reflexes: Deep tendon reflexes are symmetric and normal bilaterally.   DIAGNOSTIC DATA (LABS, IMAGING, TESTING) - I reviewed patient records, labs, notes, testing and imaging myself where available.  Lab Results  Component Value Date   WBC 6.8 06/14/2021   HGB 10.4 (L) 06/14/2021   HCT 32.4 (L) 06/14/2021   MCV 94.2 06/14/2021   PLT 247 06/14/2021      Component Value Date/Time   NA 141 04/05/2022 1115   NA 140 06/17/2017 1315   K 4.0 04/05/2022 1115   K 4.5 06/17/2017 1315   CL 103 04/05/2022 1115   CO2 26 04/05/2022 1115   CO2 28 06/17/2017 1315   GLUCOSE 109 (H) 04/05/2022 1115   GLUCOSE 135 (H) 01/15/2022 1148   GLUCOSE 95 06/17/2017 1315   BUN 13 04/05/2022 1115   BUN 18.1 06/17/2017 1315   CREATININE 0.91 04/05/2022 1115   CREATININE 0.86 06/14/2021 1513   CREATININE 0.84 12/08/2020 1609   CREATININE 0.8 06/17/2017 1315   CALCIUM 9.0 04/05/2022 1115   CALCIUM 9.8 06/17/2017 1315   PROT 6.0 04/05/2022 1115   PROT 6.4 06/17/2017 1315   ALBUMIN  3.9 04/05/2022 1115   ALBUMIN 3.3 (L) 06/17/2017 1315   AST 9 04/05/2022 1115   AST 11 (L) 06/14/2021 1513   AST 16 06/17/2017 1315   ALT 12 04/05/2022 1115   ALT 13 06/14/2021 1513   ALT 10 06/17/2017 1315   ALKPHOS 102 04/05/2022 1115   ALKPHOS 82 06/17/2017 1315   BILITOT 0.3 04/05/2022 1115   BILITOT 0.4 06/14/2021 1513   BILITOT 0.47 06/17/2017 1315   GFRNONAA >60 06/14/2021 1513   GFRAA >60 06/20/2020 1405   Lab Results  Component Value Date   CHOL 140 07/13/2021   HDL 67.00 07/13/2021   LDLCALC 58 07/13/2021   LDLDIRECT 142.2 12/26/2008   TRIG 72.0 07/13/2021   CHOLHDL 2 07/13/2021   Lab Results  Component  Value Date   HGBA1C 6.5 01/15/2022   No results found for: "VITAMINB12" Lab Results  Component Value Date   TSH 3.620 04/05/2022      ASSESSMENT AND PLAN 81 y.o. year old male  has a past medical history of Anxiety, Arthritis, Cancer ([place]) (2016), COPD (chronic obstructive pulmonary disease) ([place]), Coronary artery disease, Diabetes type 2, controlled ([place]) (07/31/2017), Dyspnea, Dysrhythmia, GERD (gastroesophageal reflux disease), Hematuria, History of hiatal hernia, History of kidney stones, Hyperlipemia, Meningioma ([place]) (10/25/2013), Peripheral vascular disease ([place]), Pneumonia, Radiation (09/18/15-10/25/15), Seizures ([place]) (02/18/2020), and Tobacco abuse. here with:  1.  Seizures  -Restart Keppra XR 750 mg daily, I have informed patient that even though he had one single seizure, I am still concerned about recurrent seizures due to the fact that he has 2 meningiomas and since he still wants to continue driving I told him the best step is to restart the antiseizure medication to avoid further potential seizures.  He is comfortable with plans.  -Follow-up in 6 months or sooner if needed   I have spent a total of 30 minutes dedicated to this patient today, preparing to see patient, performing a medically appropriate examination and evaluation, ordering tests and/or medications and procedures, and counseling and educating the patient/family/caregiver; independently interpreting result and communicating results to the family/patient/caregiver; and documenting clinical information in the electronic medical record.   Alric Ran, MD 04/12/2022, 10:05 AM [facility] [address] [place], [place] [address] [phone]

## 2022-04-18 ENCOUNTER — Encounter: Payer: Self-pay | Admitting: Internal Medicine

## 2022-04-18 ENCOUNTER — Ambulatory Visit: Payer: Medicare Other | Admitting: Internal Medicine

## 2022-04-18 VITALS — BP 100/60 | HR 67 | Temp 97.9°F | Ht 69.0 in | Wt 181.4 lb

## 2022-04-18 DIAGNOSIS — J449 Chronic obstructive pulmonary disease, unspecified: Secondary | ICD-10-CM | POA: Diagnosis not present

## 2022-04-18 NOTE — Patient Instructions (Signed)
Please schedule follow up scheduled with myself in 4 months.  If my schedule is not open yet, we will contact you with a reminder closer to that time. Please call [phone] if you haven't heard from [place] a month before.   Continue breztri, and albuterol as needed.   Quit vaping!  Call me sooner if you have trouble with your breathing.

## 2022-04-18 NOTE — Progress Notes (Addendum)
[name]    [id number]    [birth year]  Primary Care Physician:O'Sullivan, Lenna Sciara, NP Date of Appointment: 04/18/2022 Established Patient Visit  Chief complaint:   Chief Complaint  Patient presents with   Follow-up    Sob with exertion.  Cough, unsure of color.     HPI: Robert Bates is a 81 y.o.  man with COPD Gold stage 2 FEV1 62% of predicted, lung cancer s/p curative radiation in 2016 and CAD s/p CABG.   Interval Updates:  Here for COPD follow up. 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   He says he has quit smoking cigarettes but only smokes a cigar these days.   He did 4-5 weeks of cardiac rehab after his bypass had to stop due to knee pain and getting back to work.    I have reviewed the patient's family social and past medical history and updated as appropriate.   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   COPD (chronic obstructive pulmonary disease) (HCC)    Coronary artery disease    Diabetes type 2, controlled ([place]) 07/31/2017   Dyspnea    Dysrhythmia    a fib   GERD (gastroesophageal reflux disease)    Hematuria    refuses work up or referral - understands risks of morbidity / mortality - 11/2008, 12/2008   History of hiatal hernia    History of kidney stones    Hyperlipemia    Meningioma ([place]) 10/25/2013   Follows with Dr. [name].    Peripheral vascular disease ([place])    Abdominal Aortic Aneursym   Pneumonia    as a child   Radiation 09/18/15-10/25/15   left lower lobe 70.2 Gy   Seizures ([place]) 02/18/2020   Tobacco abuse     Past Surgical History:  Procedure Laterality Date   CHOLECYSTECTOMY N/A 07/23/2017   Procedure: LAPAROSCOPIC CHOLECYSTECTOMY;  Surgeon: Mickeal Skinner, MD;  [facility];   Service: General;  Laterality: N/A;   CLIPPING OF ATRIAL APPENDAGE Left 05/08/2021   Procedure: CLIPPING OF ATRIAL APPENDAGE USING 55 ATRICLIP;  Surgeon: Wonda Olds, MD;  Location: [facility];  Service: Open Heart Surgery;  Laterality: Left;   COLONOSCOPY     CORONARY ARTERY BYPASS GRAFT N/A 05/08/2021   Procedure: CORONARY ARTERY BYPASS GRAFTING (CABG)X 3 USING LEFT INTERNAL MAMMARY ARTERY AND RIGHT GREATER SAPEHNOUS VEIN;  Surgeon: Wonda Olds, MD;  Location: [facility];  Service: Open Heart Surgery;  Laterality: N/A;   ENDOVEIN HARVEST OF GREATER SAPHENOUS VEIN Right 05/08/2021   Procedure: ENDOVEIN HARVEST OF GREATER SAPHENOUS VEIN;  Surgeon: Wonda Olds, MD;  Location: [facility];  Service: Open Heart Surgery;  Laterality: Right;   EYE SURGERY Bilateral    Cataracts removed w/ lens implant   HERNIA REPAIR     Left 36 years ago . Right inguinal hernia repair 10-01-17 Dr. Kieth Brightly   INGUINAL HERNIA REPAIR Right 10/01/2017   Procedure: RIGHT INGUINAL HERNIA REPAIR WITH MESH;  Surgeon: Kinsinger, Arta Bruce, MD;  [facility];  Service: General;  Laterality: Right;  TAP BLOCK   INSERTION OF MESH Right 10/01/2017   Procedure: INSERTION OF MESH;  Surgeon: Kieth Brightly,  Arta Bruce, MD;  [facility];  Service: General;  Laterality: Right;   IR THORACENTESIS ASP PLEURAL SPACE W/IMG GUIDE  05/18/2021   IR THORACENTESIS ASP PLEURAL SPACE W/IMG GUIDE  06/07/2021   LEFT HEART CATH AND CORONARY ANGIOGRAPHY N/A 04/20/2021   Procedure: LEFT HEART CATH AND CORONARY ANGIOGRAPHY;  Surgeon: Wellington Hampshire, MD;  Location: [facility];  Service: Cardiovascular;  Laterality: N/A;   TEE WITHOUT CARDIOVERSION N/A 05/08/2021   Procedure: TRANSESOPHAGEAL ECHOCARDIOGRAM (TEE);  Surgeon: Wonda Olds, MD;  Location: [facility];  Service: Open Heart Surgery;  Laterality: N/A;   TONSILLECTOMY     TONSILLECTOMY     VIDEO BRONCHOSCOPY Bilateral 07/26/2015   Procedure: VIDEO BRONCHOSCOPY WITH FLUORO;  Surgeon:  [name], MD;  [facility];  Service: Cardiopulmonary;  Laterality: Bilateral;   VIDEO BRONCHOSCOPY WITH ENDOBRONCHIAL NAVIGATION N/A 08/23/2015   Procedure: VIDEO BRONCHOSCOPY WITH ENDOBRONCHIAL NAVIGATION;  Surgeon: Grace Isaac, MD;  [facility];  Service: Thoracic;  Laterality: N/A;   VIDEO BRONCHOSCOPY WITH ENDOBRONCHIAL ULTRASOUND N/A 08/23/2015   Procedure: VIDEO BRONCHOSCOPY WITH ENDOBRONCHIAL ULTRASOUND;  Surgeon: Grace Isaac, MD;  [facility];  Service: Thoracic;  Laterality: N/A;    Family History  Problem Relation Age of Onset   Leukemia Father    Emphysema Father    Learning disabilities Son    Atrial fibrillation Son    Leukemia Other    Stroke Other     Social History   Occupational History   Occupation: Retired    Fish farm manager: [employer]    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[employer]  Tobacco Use   Smoking status: Former    Packs/day: 1.00    Years: 57.00    Total pack years: 57.00    Types: Cigarettes, Cigars    Quit date: 08/08/2015    Years since quitting: 6.6   Smokeless tobacco: Former    Types: Chew    Quit date: 11/04/1958   Tobacco comments:    Will smoke cigar every once in awhile.  Vaping daily started a couple weeks ago.  04/18/22 hfb  Vaping Use   Vaping Use: Former  Substance and Sexual Activity   Alcohol use: Not Currently    Alcohol/week: 0.0 standard drinks of alcohol   Drug use: No   Sexual activity: Not Currently     Physical Exam: Blood pressure 100/60, pulse 67, temperature 97.9 F (36.6 C), temperature source Oral, height 5\' 9"  (1.753 m), weight 181 lb 6.4 oz (82.3 kg), SpO2 96 %.  Gen:      No acute distressLungs:    diminished. No wheezes or crackles CV:         Regular rate and rhythm; no murmurs, rubs, or gallops.  No pedal edema   Data Reviewed: Imaging: I have personally reviewed the chest xray Nov 2022 with chronic small left effusion and scarring.  PFTs:     Latest Ref Rng &  Units 05/19/2017   10:02 AM 08/25/2015    9:55 AM  PFT Results  FVC-Pre L 2.84  3.43   FVC-Predicted Pre % 73  87   FVC-Post L 3.16  3.87   FVC-Predicted Post % 81  98   Pre FEV1/FVC % % 61  67   Post FEV1/FCV % % 61  65   FEV1-Pre L 1.74  2.31   FEV1-Predicted Pre % 62  81   FEV1-Post L 1.93  2.50   DLCO uncorrected  ml/min/mmHg 16.14  18.35   DLCO UNC% % 54  61   DLCO corrected ml/min/mmHg 16.10    DLCO COR %Predicted % 54    DLVA Predicted % 71  67   TLC L 5.61  6.24   TLC % Predicted % 84  93   RV % Predicted % 108  121    I have personally reviewed the patient's PFTs and moderately airflow limitation  Labs:  Immunization status: Immunization History  Administered Date(s) Administered   Fluad Quad(high Dose 65+) 07/23/2019, 09/04/2020, 07/13/2021   Influenza Split 08/02/2011, 08/18/2012   Influenza Whole 08/17/2008   Influenza, High Dose Seasonal PF 08/09/2015, 09/30/2016, 07/30/2017, 08/07/2018   Influenza,inj,Quad PF,6+ Mos 07/27/2013, 08/09/2014   PFIZER Comirnaty(Gray Top)Covid-19 Tri-Sucrose Vaccine 02/16/2021   PFIZER(Purple Top)SARS-COV-2 Vaccination 01/28/2020, 02/21/2020, 09/04/2020   Pfizer Covid-19 Vaccine Bivalent Booster 33yrs & up 08/03/2021   Pneumococcal Conjugate-13 08/09/2014   Pneumococcal Polysaccharide-23 05/14/2010   Td 07/25/2008    External Records Personally Reviewed: echocardiogram with diastolic dysfunction  Assessment:  Moderate COPD 62% of predicted, controlled CAD s/p CABG July 2022 Left Pleural effusion Atrial Fibrillation Stage 2B lung cancer of the left side s/p radiation, in remission  Plan/Recommendations: Continue breztri. Continue using albuterol as needed with more frequent nebs while in exacerbation   Return to Care: Return in about 4 months (around 08/18/2022).   Lenice Llamas, MD [facility] [place]

## 2022-04-19 ENCOUNTER — Other Ambulatory Visit (HOSPITAL_BASED_OUTPATIENT_CLINIC_OR_DEPARTMENT_OTHER): Payer: Self-pay

## 2022-04-19 ENCOUNTER — Telehealth: Payer: Self-pay | Admitting: Family

## 2022-04-19 ENCOUNTER — Ambulatory Visit (INDEPENDENT_AMBULATORY_CARE_PROVIDER_SITE_OTHER): Payer: Medicare Other | Admitting: Family

## 2022-04-19 DIAGNOSIS — I714 Abdominal aortic aneurysm, without rupture, unspecified: Secondary | ICD-10-CM

## 2022-04-19 DIAGNOSIS — R569 Unspecified convulsions: Secondary | ICD-10-CM

## 2022-04-19 DIAGNOSIS — I10 Essential (primary) hypertension: Secondary | ICD-10-CM | POA: Diagnosis not present

## 2022-04-19 DIAGNOSIS — E119 Type 2 diabetes mellitus without complications: Secondary | ICD-10-CM | POA: Diagnosis not present

## 2022-04-19 LAB — BASIC METABOLIC PANEL
BUN: 19 mg/dL (ref 6–23)
CO2: 31 mEq/L (ref 19–32)
Calcium: 9.1 mg/dL (ref 8.4–10.5)
Chloride: 104 mEq/L (ref 96–112)
Creatinine, Ser: 0.98 mg/dL (ref 0.40–1.50)
GFR: 72.66 mL/min (ref >60.00)
Glucose, Bld: 107 mg/dL — ABNORMAL HIGH (ref 70–99)
Potassium: 4.7 mEq/L (ref 3.5–5.1)
Sodium: 141 mEq/L (ref 135–145)

## 2022-04-19 LAB — HEMOGLOBIN A1C: Hgb A1c MFr Bld: 6.4 % (ref 4.6–6.5)

## 2022-04-19 NOTE — Telephone Encounter (Signed)
See mychart.  

## 2022-04-19 NOTE — Assessment & Plan Note (Signed)
No recent seizures.  Continues keppra.

## 2022-04-19 NOTE — Progress Notes (Addendum)
Subjective:   By signing my name below, I, Robert Bates, attest that this documentation has been prepared under the direction and in the presence of [name] NP, 04/19/2022     Patient ID: Robert Bates, male    DOB: [birth year], 81 y.o.   MRN: [id number]  Chief Complaint  Patient presents with   Hypertension    Here for follow up   Diabetes    Here for follow up   Back Pain    Complains of low back pain    HPI Patient is in today for an office visit.  Back and Neck Pain: He reports that his back and neck pain is still persistent. His neck pain worsens when he is driving. He is requesting pain medications to help alleviate symptoms.  Weight: He is inquiring about how to decrease the weight in his abdominal area. He states that he has trouble bending over. He also states that he can hardly tie his shoes. He is not regularly exercising. He is trying not to eat as much as he used to but still has cravings. He also eats fast food.  Diabetes: He is currently taking 500 Mg of Metformin. Lab Results  Component Value Date   HGBA1C 6.5 01/15/2022  Blood Pressure: As of today's visit, his blood pressure is normal. BP Readings from Last 3 Encounters:  04/19/22 127/65  04/18/22 100/60  04/12/22 (!) 149/82   Pulse Readings from Last 3 Encounters:  04/19/22 (!) 58  04/18/22 67  04/12/22 (!) 57  Seizures: He denies of any recent surgeries. He is currently taking 750 Mg of Levetiracetam.  Breathing: He states that his breathing has been "lousy".   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   COPD (chronic obstructive pulmonary disease) (HCC)    Coronary artery disease    Diabetes type  2, controlled ([place]) 07/31/2017   Dyspnea    Dysrhythmia    a fib   GERD (gastroesophageal reflux disease)    Hematuria    refuses work up or referral - understands risks of morbidity / mortality - 11/2008, 12/2008   History of hiatal hernia    History of kidney stones    Hyperlipemia    Meningioma ([place]) 10/25/2013   Follows with Dr. [name].    Peripheral vascular disease ([place])    Abdominal Aortic Aneursym   Pneumonia    as a child   Radiation 09/18/15-10/25/15   left lower lobe 70.2 Gy   Seizures ([place]) 02/18/2020   Tobacco abuse     Past Surgical History:  Procedure Laterality Date   CHOLECYSTECTOMY N/A 07/23/2017   Procedure: LAPAROSCOPIC CHOLECYSTECTOMY;  Surgeon: Mickeal Skinner, MD;  [facility];  Service: General;  Laterality: N/A;   CLIPPING OF ATRIAL APPENDAGE Left 05/08/2021   Procedure: CLIPPING OF ATRIAL APPENDAGE USING 55 ATRICLIP;  Surgeon: Wonda Olds, MD;  Location: [facility];  Service: Open Heart Surgery;  Laterality: Left;   COLONOSCOPY     CORONARY ARTERY BYPASS GRAFT N/A 05/08/2021   Procedure: CORONARY ARTERY BYPASS GRAFTING (CABG)X 3 USING LEFT INTERNAL MAMMARY ARTERY AND RIGHT GREATER SAPEHNOUS  VEIN;  Surgeon: Wonda Olds, MD;  Location: [facility];  Service: Open Heart Surgery;  Laterality: N/A;   ENDOVEIN HARVEST OF GREATER SAPHENOUS VEIN Right 05/08/2021   Procedure: ENDOVEIN HARVEST OF GREATER SAPHENOUS VEIN;  Surgeon: Wonda Olds, MD;  Location: [facility];  Service: Open Heart Surgery;  Laterality: Right;   EYE SURGERY Bilateral    Cataracts removed w/ lens implant   HERNIA REPAIR     Left 36 years ago . Right inguinal hernia repair 10-01-17 Dr. Kieth Brightly   INGUINAL HERNIA REPAIR Right 10/01/2017   Procedure: RIGHT INGUINAL HERNIA REPAIR WITH MESH;  Surgeon: Kinsinger, Arta Bruce, MD;  [facility];  Service: General;  Laterality: Right;  TAP BLOCK   INSERTION OF MESH Right 10/01/2017   Procedure: INSERTION OF MESH;  Surgeon:  Kieth Brightly Arta Bruce, MD;  [facility];  Service: General;  Laterality: Right;   IR THORACENTESIS ASP PLEURAL SPACE W/IMG GUIDE  05/18/2021   IR THORACENTESIS ASP PLEURAL SPACE W/IMG GUIDE  06/07/2021   LEFT HEART CATH AND CORONARY ANGIOGRAPHY N/A 04/20/2021   Procedure: LEFT HEART CATH AND CORONARY ANGIOGRAPHY;  Surgeon: Wellington Hampshire, MD;  Location: [facility];  Service: Cardiovascular;  Laterality: N/A;   TEE WITHOUT CARDIOVERSION N/A 05/08/2021   Procedure: TRANSESOPHAGEAL ECHOCARDIOGRAM (TEE);  Surgeon: Wonda Olds, MD;  Location: [facility];  Service: Open Heart Surgery;  Laterality: N/A;   TONSILLECTOMY     TONSILLECTOMY     VIDEO BRONCHOSCOPY Bilateral 07/26/2015   Procedure: VIDEO BRONCHOSCOPY WITH FLUORO;  Surgeon: [name], MD;  [facility];  Service: Cardiopulmonary;  Laterality: Bilateral;   VIDEO BRONCHOSCOPY WITH ENDOBRONCHIAL NAVIGATION N/A 08/23/2015   Procedure: VIDEO BRONCHOSCOPY WITH ENDOBRONCHIAL NAVIGATION;  Surgeon: Grace Isaac, MD;  [facility];  Service: Thoracic;  Laterality: N/A;   VIDEO BRONCHOSCOPY WITH ENDOBRONCHIAL ULTRASOUND N/A 08/23/2015   Procedure: VIDEO BRONCHOSCOPY WITH ENDOBRONCHIAL ULTRASOUND;  Surgeon: Grace Isaac, MD;  [facility];  Service: Thoracic;  Laterality: N/A;    Family History  Problem Relation Age of Onset   Leukemia Father    Emphysema Father    Learning disabilities Son    Atrial fibrillation Son    Leukemia Other    Stroke Other     Social History   Socioeconomic History   Marital status: Married    Spouse name: Not on file   Number of children: 2   Years of education: Not on file   Highest education level: Not on file  Occupational History   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  Vaping daily started a couple weeks ago.  04/18/22 hfb  Vaping Use   Vaping Use: Former  Substance and Sexual Activity   Alcohol use: Not Currently    Alcohol/week: 0.0 standard drinks of alcohol   Drug use: No   Sexual activity: Not Currently  Other Topics Concern   Not on file  Social History Narrative  Not on file   Social Determinants of Health   Financial Resource Strain: Medium Risk (11/22/2021)   Overall Financial Resource Strain (CARDIA)    Difficulty of Paying Living Expenses: Somewhat hard  Food Insecurity: No Food Insecurity (09/25/2021)   Hunger Vital Sign    Worried About Running Out of Food in the Last Year: Never true    Ran Out of Food in the Last Year: Never true  Transportation Needs: No Transportation Needs (06/29/2021)   PRAPARE - Hydrologist (Medical): No    Lack of Transportation (Non-Medical): No  Physical Activity: Insufficiently Active (08/05/2021)   Exercise Vital Sign    Days of Exercise per Week: 2 days    Minutes of Exercise per Session: 40 min  Stress: No Stress Concern Present (10/01/2021)   [place]    Feeling of Stress : Not at all  Social Connections: Socially Isolated (10/01/2021)   Social Connection and Isolation Panel [NHANES]    Frequency of Communication with Friends and Family: Once a week    Frequency of Social Gatherings with Friends and Family: Once a week    Attends Religious Services: Never    Marine scientist or Organizations: No    Attends Archivist Meetings: Never    Marital Status: Married  Human resources officer Violence: Not At Risk (10/01/2021)   Humiliation, Afraid, Rape, and Kick questionnaire    Fear of Current or Ex-Partner: No     Emotionally Abused: No    Physically Abused: No    Sexually Abused: No    Outpatient Medications Prior to Visit  Medication Sig Dispense Refill   acetaminophen (TYLENOL) 500 MG tablet Take 1-2 tablets (500-1,000 mg total) by mouth every 6 (six) hours as needed. 30 tablet 0   albuterol (PROVENTIL) (2.5 MG/3ML) 0.083% nebulizer solution Take 3 mLs (2.5 mg total) by nebulization every 6 (six) hours as needed for wheezing or shortness of breath. 75 mL 12   albuterol (VENTOLIN HFA) 108 (90 Base) MCG/ACT inhaler Inhale 2 puffs into the lungs every 6 (six) hours as needed for wheezing or shortness of breath. 8.5 g 5   amiodarone (PACERONE) 200 MG tablet Take 1/2 tablet (100 mg total) by mouth daily. 45 tablet 3   atorvastatin (LIPITOR) 80 MG tablet Take 1 tablet (80 mg total) by mouth daily. 90 tablet 3   Budeson-Glycopyrrol-Formoterol (BREZTRI AEROSPHERE) 160-9-4.8 MCG/ACT AERO Inhale 2 puffs into the lungs in the morning and at bedtime. 10.7 g 5   furosemide (LASIX) 20 MG tablet TAKE 2 TABLETS BY MOUTH DAILY FOR 2 DAYS THEN REDUCE TO 1 TABLET BY MOUTH DAILY 92 tablet 3   gabapentin (NEURONTIN) 300 MG capsule Take 1 capsule (300 mg total) by mouth 3 (three) times daily. 90 capsule 1   Levetiracetam 750 MG TB24 Take 1 tablet (750 mg total) by mouth at bedtime. 30 tablet 11   Multiple Vitamin (MULTIVITAMIN WITH MINERALS) TABS tablet Take 1 tablet by mouth daily in the afternoon.     omeprazole (PRILOSEC) 40 MG capsule Take 1 capsule (40 mg total) by mouth daily. 90 capsule 1   potassium chloride (KLOR-CON) 10 MEQ tablet TAKE 2 TABLETS BY MOUTH DAILY FOR 2 DAYS THEN REDUCE TO 1 TABLET BY MOUTH DAILY 92 tablet 3   rivaroxaban (XARELTO) 20 MG TABS tablet TAKE 1 TABLET BY MOUTH DAILY WITH SUPPER 30 tablet 5   tamsulosin (FLOMAX) 0.4 MG CAPS  capsule TAKE 1 CAPSULE (0.4 MG TOTAL) BY MOUTH DAILY. 90 capsule 1   No facility-administered medications prior to visit.    Allergies  Allergen Reactions   Iodine  Other (See Comments)    neck swells   Iohexol Swelling    Neck and gland swelling per patient.    ROS    See HPI Objective:    Physical Exam Constitutional:      General: He is not in acute distress.    Appearance: Normal appearance. He is not ill-appearing.  HENT:     Head: Normocephalic and atraumatic.     Right Ear: External ear normal.     Left Ear: External ear normal.  Eyes:     Extraocular Movements: Extraocular movements intact.     Pupils: Pupils are equal, round, and reactive to light.  Cardiovascular:     Rate and Rhythm: Normal rate and regular rhythm.     Heart sounds: Normal heart sounds. No murmur heard.    No gallop.  Pulmonary:     Effort: Pulmonary effort is normal. No respiratory distress.     Breath sounds: Normal breath sounds. No wheezing or rales.  Skin:    General: Skin is warm and dry.  Neurological:     Mental Status: He is alert and oriented to person, place, and time.  Psychiatric:        Mood and Affect: Mood normal.        Behavior: Behavior normal.        Judgment: Judgment normal.     BP 127/65 (BP Location: Right Arm, Patient Position: Sitting, Cuff Size: Small)   Pulse (!) 58   Temp (!) 97.5 F (36.4 C) (Oral)   Resp 16   Wt 181 lb (82.1 kg)   SpO2 100%   BMI 26.73 kg/m  Wt Readings from Last 3 Encounters:  04/19/22 181 lb (82.1 kg)  04/18/22 181 lb 6.4 oz (82.3 kg)  04/12/22 178 lb 8 oz (81 kg)       Assessment & Plan:   Problem List Items Addressed This Visit       Unprioritized   Essential hypertension (Chronic)    BP Readings from Last 3 Encounters:  04/19/22 127/65  04/18/22 100/60  04/12/22 (!) 149/82  Stable without medication. Monitor.       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HCC) (Chronic)    Stable on [place] at 3.2 cm. Repeat US in 3 years. Reviewed results with pt.       Seizures ([place])    No recent seizures.  Continues keppra.        Diabetes type 2, controlled ([place])    Lab Results  Component Value Date    HGBA1C 6.5 01/15/2022   HGBA1C 5.8 07/13/2021   HGBA1C 6.5 04/11/2021   Lab Results  Component Value Date   MICROALBUR 0.9 01/15/2022   LDLCALC 58 07/13/2021   CREATININE 0.91 04/05/2022  Continues metformin. Obtain follow up A1C. Continue work on diet/exercise/weight loss.       Relevant Orders   Hemoglobin G9F   Basic metabolic panel    No orders of the defined types were placed in this encounter.   I, Nance Pear, NP, personally preformed the services described in this documentation.  All medical record entries made by the scribe were at my direction and in my presence.  I have reviewed the chart and discharge instructions (if applicable) and agree that the record reflects my personal performance and is accurate  and complete. 04/19/2022   I,Amber Collins,acting as a scribe for Nance Pear, NP.,have documented all relevant documentation on the behalf of Nance Pear, NP,as directed by  Nance Pear, NP while in the presence of Nance Pear, NP.   Nance Pear, NP

## 2022-04-19 NOTE — Assessment & Plan Note (Signed)
BP Readings from Last 3 Encounters:  04/19/22 127/65  04/18/22 100/60  04/12/22 (!) 149/82   Stable without medication. Monitor.

## 2022-04-19 NOTE — Assessment & Plan Note (Signed)
Stable on [place] at 3.2 cm. Repeat US in 3 years. Reviewed results with pt.

## 2022-04-19 NOTE — Assessment & Plan Note (Signed)
Lab Results  Component Value Date   HGBA1C 6.5 01/15/2022   HGBA1C 5.8 07/13/2021   HGBA1C 6.5 04/11/2021   Lab Results  Component Value Date   MICROALBUR 0.9 01/15/2022   LDLCALC 58 07/13/2021   CREATININE 0.91 04/05/2022   Continues metformin. Obtain follow up A1C. Continue work on diet/exercise/weight loss.

## 2022-05-06 ENCOUNTER — Other Ambulatory Visit: Payer: Self-pay

## 2022-05-06 ENCOUNTER — Observation Stay (HOSPITAL_BASED_OUTPATIENT_CLINIC_OR_DEPARTMENT_OTHER)
Admission: EM | Admit: 2022-05-06 | Discharge: 2022-05-08 | Disposition: A | Discharge status: Home / Self Care | Payer: Medicare Other | Attending: Family Medicine | Admitting: Family Medicine

## 2022-05-06 ENCOUNTER — Emergency Department (HOSPITAL_BASED_OUTPATIENT_CLINIC_OR_DEPARTMENT_OTHER): Payer: Medicare Other

## 2022-05-06 ENCOUNTER — Encounter (HOSPITAL_BASED_OUTPATIENT_CLINIC_OR_DEPARTMENT_OTHER): Payer: Self-pay

## 2022-05-06 ENCOUNTER — Telehealth: Payer: Self-pay | Admitting: Family

## 2022-05-06 DIAGNOSIS — I2510 Atherosclerotic heart disease of native coronary artery without angina pectoris: Secondary | ICD-10-CM | POA: Diagnosis not present

## 2022-05-06 DIAGNOSIS — G40909 Epilepsy, unspecified, not intractable, without status epilepticus: Secondary | ICD-10-CM | POA: Insufficient documentation

## 2022-05-06 DIAGNOSIS — E119 Type 2 diabetes mellitus without complications: Secondary | ICD-10-CM | POA: Diagnosis not present

## 2022-05-06 DIAGNOSIS — J449 Chronic obstructive pulmonary disease, unspecified: Secondary | ICD-10-CM | POA: Insufficient documentation

## 2022-05-06 DIAGNOSIS — J9691 Respiratory failure, unspecified with hypoxia: Secondary | ICD-10-CM | POA: Insufficient documentation

## 2022-05-06 DIAGNOSIS — J90 Pleural effusion, not elsewhere classified: Principal | ICD-10-CM | POA: Insufficient documentation

## 2022-05-06 DIAGNOSIS — R0789 Other chest pain: Secondary | ICD-10-CM | POA: Diagnosis not present

## 2022-05-06 DIAGNOSIS — R0609 Other forms of dyspnea: Secondary | ICD-10-CM | POA: Diagnosis present

## 2022-05-06 DIAGNOSIS — Z7901 Long term (current) use of anticoagulants: Secondary | ICD-10-CM | POA: Insufficient documentation

## 2022-05-06 DIAGNOSIS — I480 Paroxysmal atrial fibrillation: Secondary | ICD-10-CM | POA: Diagnosis not present

## 2022-05-06 DIAGNOSIS — Z79899 Other long term (current) drug therapy: Secondary | ICD-10-CM | POA: Diagnosis not present

## 2022-05-06 DIAGNOSIS — R079 Chest pain, unspecified: Secondary | ICD-10-CM

## 2022-05-06 DIAGNOSIS — R112 Nausea with vomiting, unspecified: Secondary | ICD-10-CM | POA: Insufficient documentation

## 2022-05-06 DIAGNOSIS — K449 Diaphragmatic hernia without obstruction or gangrene: Secondary | ICD-10-CM | POA: Diagnosis not present

## 2022-05-06 DIAGNOSIS — N210 Calculus in bladder: Secondary | ICD-10-CM | POA: Diagnosis not present

## 2022-05-06 DIAGNOSIS — Z7984 Long term (current) use of oral hypoglycemic drugs: Secondary | ICD-10-CM | POA: Diagnosis not present

## 2022-05-06 DIAGNOSIS — K429 Umbilical hernia without obstruction or gangrene: Secondary | ICD-10-CM | POA: Diagnosis not present

## 2022-05-06 DIAGNOSIS — Z87891 Personal history of nicotine dependence: Secondary | ICD-10-CM | POA: Insufficient documentation

## 2022-05-06 DIAGNOSIS — I714 Abdominal aortic aneurysm, without rupture, unspecified: Secondary | ICD-10-CM | POA: Diagnosis not present

## 2022-05-06 DIAGNOSIS — Z951 Presence of aortocoronary bypass graft: Secondary | ICD-10-CM | POA: Diagnosis not present

## 2022-05-06 DIAGNOSIS — C3412 Malignant neoplasm of upper lobe, left bronchus or lung: Secondary | ICD-10-CM | POA: Insufficient documentation

## 2022-05-06 DIAGNOSIS — R0602 Shortness of breath: Secondary | ICD-10-CM | POA: Diagnosis not present

## 2022-05-06 LAB — BASIC METABOLIC PANEL
Anion gap: 9 (ref 5–15)
BUN: 15 mg/dL (ref 8–23)
CO2: 27 mmol/L (ref 22–32)
Calcium: 8.7 mg/dL — ABNORMAL LOW (ref 8.9–10.3)
Chloride: 105 mmol/L (ref 98–111)
Creatinine, Ser: 0.9 mg/dL (ref 0.61–1.24)
GFR, Estimated: >60 mL/min (ref >60)
Glucose, Bld: 123 mg/dL — ABNORMAL HIGH (ref 70–99)
Potassium: 4.3 mmol/L (ref 3.5–5.1)
Sodium: 141 mmol/L (ref 135–145)

## 2022-05-06 LAB — CBC
HCT: 38 % — ABNORMAL LOW (ref 39.0–52.0)
Hemoglobin: 11.6 g/dL — ABNORMAL LOW (ref 13.0–17.0)
MCH: 27.2 pg (ref 26.0–34.0)
MCHC: 30.5 g/dL (ref 30.0–36.0)
MCV: 89 fL (ref 80.0–100.0)
Platelets: 227 10*3/uL (ref 150–400)
RBC: 4.27 MIL/uL (ref 4.22–5.81)
RDW: 17.2 % — ABNORMAL HIGH (ref 11.5–15.5)
WBC: 7.2 10*3/uL (ref 4.0–10.5)
nRBC: 0 % (ref 0.0–0.2)

## 2022-05-06 LAB — TROPONIN I (HIGH SENSITIVITY)
Troponin I (High Sensitivity): 9 ng/L (ref <18)
Troponin I (High Sensitivity): 8 ng/L (ref <18)

## 2022-05-06 MED ORDER — DIPHENHYDRAMINE HCL 25 MG PO CAPS: 50.0000 mg | ORAL_CAPSULE | Freq: Once | ORAL | Status: AC

## 2022-05-06 MED ORDER — METHYLPREDNISOLONE SODIUM SUCC 40 MG IJ SOLR
40.0000 mg | Freq: Once | INTRAMUSCULAR | Status: AC
  Administered 2022-05-06: 40 mg via INTRAVENOUS
  Filled 2022-05-06: qty 1

## 2022-05-06 MED ORDER — DIPHENHYDRAMINE HCL 50 MG/ML IJ SOLN
50.0000 mg | Freq: Once | INTRAMUSCULAR | Status: AC
  Administered 2022-05-06: 50 mg via INTRAVENOUS
  Filled 2022-05-06: qty 1

## 2022-05-06 MED ORDER — IOHEXOL 350 MG/ML SOLN
100.0000 mL | Freq: Once | INTRAVENOUS | Status: AC | PRN
  Administered 2022-05-06: 100 mL via INTRAVENOUS

## 2022-05-06 MED ORDER — FUROSEMIDE 10 MG/ML IJ SOLN
20.0000 mg | Freq: Once | INTRAMUSCULAR | Status: AC
  Administered 2022-05-06: 20 mg via INTRAVENOUS
  Filled 2022-05-06: qty 2

## 2022-05-06 NOTE — ED Provider Notes (Signed)
Blood pressure (!) 143/83, pulse 63, temperature 97.7 F (36.5 C), temperature source Oral, resp. rate 20, height 5\' 9"  (1.753 m), weight 83.9 kg, SpO2 93 %.  Assuming care from Dr. Almyra Free.  In short, Robert Bates is a 81 y.o. male with a chief complaint of Shortness of Breath and Chest Pain .  Refer to the original H&P for additional details.  The current plan of care is to follow up on CTA.  CTA dissection independently evaluated and agree with radiology interpretation.  No dissection or central PE.  Patient has a large pleural effusion on the left which I suspect is causing symptoms of chest pain as well as the shortness of breath.  He has had thoracentesis in the past.  Does not need this emergently but do feel that he would benefit from admit and thoracentesis.  We will start on Lasix but does appear to have prior history of lung cancer suspect this may be malignant effusion.   Discussed patient's case with [facility] to request admission. Patient and family (if present) updated with plan.  I reviewed all nursing notes, vitals, pertinent old records, EKGs, labs, imaging (as available).    Margette Fast, MD 05/06/22 [phone]

## 2022-05-06 NOTE — ED Notes (Signed)
Patient transported to CT 

## 2022-05-06 NOTE — Telephone Encounter (Signed)
Nurse Assessment Nurse: D'Heur Lucia Gaskins, RN, [place] Date/Time (Eastern Time): 05/06/2022 11:26:56 AM Confirm and document reason for call. If symptomatic, describe symptoms. ---Caller states he has a pain on his left side down the lower rib cage up to his neck and he is short of breath. He feels like he is swollen on that side. It started Saturday night. No injury. The pain will go away eventually when he lies down on his left side. Does the patient have any new or worsening symptoms? ---Yes Will a triage be completed? ---Yes Related visit to physician within the last 2 weeks? ---No Does the PT have any chronic conditions? (i.e. diabetes, asthma, this includes High risk factors for pregnancy, etc.) ---Yes List chronic conditions. ---HTN, A-fib, acid reflux, Is this a behavioral health or substance abuse call? ---No Guidelines Guideline Title Affirmed Question Affirmed Notes Nurse Date/Time Eilene Ghazi Time) Chest Pain [1] Chest pain lasts > 5 minutes AND [2] age > 69 [place], RN, [place] 05/06/2022 11:30:41 AM PLEASE NOTE: All timestamps contained within this report are represented as [place] Standard Time. CONFIDENTIALTY NOTICE: This fax transmission is intended only for the addressee. It contains information that is legally privileged, confidential or otherwise protected from use or disclosure. If you are not the intended recipient, you are strictly prohibited from reviewing, disclosing, copying using or disseminating any of this information or taking any action in reliance on or regarding this information. If you have received this fax in error, please notify us immediately by telephone so that we can arrange for its return to [place]. Phone: [phone], Toll-Free: [phone], Fax: [phone] Page: 2 of 2 Call Id: [id number] [place]. Time Eilene Ghazi Time) Disposition Final User 05/06/2022 11:24:39 AM Send to Urgent Lyda Jester 05/06/2022 11:33:17 AM Call EMS 911 Now Yes  D'Heur Lucia Gaskins, RN, [facility] 05/06/2022 11:37:06 AM 911 Outcome Documentation [place], RN, [facility] Reason: Family/friend driving to ED Final Disposition 05/06/2022 11:33:17 AM Call EMS 911 Now Yes D'Heur Lucia Gaskins, RN, [facility] Caller Disagree/Comply Comply Caller Understands Yes PreDisposition Call Doctor Care Advice Given Per Guideline CALL EMS 911 NOW: * Immediate medical attention is needed. You need to hang up and call 911 (or an ambulance). NOTE TO TRIAGER - IF CALLER ASKS ABOUT ASPIRIN: * Call EMS 911 first. CARE ADVICE given per Chest Pain (Adult) guideline. Comments User: [facility], D'Heur Lucia Gaskins, RN Date/Time Eilene Ghazi Time): 05/06/2022 11:34:37 AM Caller states he has hx of open heart sx. [facility] - ED

## 2022-05-06 NOTE — ED Triage Notes (Signed)
C/o left chest pain radiating to left neck & arm since Saturday night. Open heart surgery on 05/08/2021.

## 2022-05-06 NOTE — ED Notes (Signed)
Patient back on monitor.

## 2022-05-06 NOTE — ED Provider Notes (Signed)
[facility] [place] EMERGENCY DEPARTMENT Provider Note   CSN: [id number] Arrival date & time: 05/06/22  1302     History  Chief Complaint  Patient presents with   Shortness of Breath   Chest Pain    Robert Bates is a 81 y.o. male.  Patient presents ER chief complaint of epigastric pain.  Described as sharp and persistent ongoing for the past 3 days now.  States that it radiates all the up to his neck.  Denies any fevers or cough denies vomiting or diarrhea.  Has a history of open heart surgery performed 1 year ago.       Home Medications Prior to Admission medications   Medication Sig Start Date End Date Taking? Authorizing Provider  acetaminophen (TYLENOL) 500 MG tablet Take 1-2 tablets (500-1,000 mg total) by mouth every 6 (six) hours as needed. 05/16/21   Nani Skillern, PA-C  albuterol (PROVENTIL) (2.5 MG/3ML) 0.083% nebulizer solution Take 3 mLs (2.5 mg total) by nebulization every 6 (six) hours as needed for wheezing or shortness of breath. 10/17/21   Spero Geralds, MD  albuterol (VENTOLIN HFA) 108 (90 Base) MCG/ACT inhaler Inhale 2 puffs into the lungs every 6 (six) hours as needed for wheezing or shortness of breath. 07/27/21   [name], NP  amiodarone (PACERONE) 200 MG tablet Take 1/2 tablet (100 mg total) by mouth daily. 04/05/22   Richardson Dopp T, PA-C  atorvastatin (LIPITOR) 80 MG tablet Take 1 tablet (80 mg total) by mouth daily. 11/09/21   Nahser, Wonda Cheng, MD  Budeson-Glycopyrrol-Formoterol (BREZTRI AEROSPHERE) 160-9-4.8 MCG/ACT AERO Inhale 2 puffs into the lungs in the morning and at bedtime. 10/17/21   Spero Geralds, MD  furosemide (LASIX) 20 MG tablet TAKE 2 TABLETS BY MOUTH DAILY FOR 2 DAYS THEN REDUCE TO 1 TABLET BY MOUTH DAILY 04/05/22   Richardson Dopp T, PA-C  gabapentin (NEURONTIN) 300 MG capsule Take 1 capsule (300 mg total) by mouth 3 (three) times daily. 01/15/22   [name], NP  Levetiracetam 750 MG TB24 Take 1 tablet (750 mg  total) by mouth at bedtime. 04/12/22   Alric Ran, MD  Multiple Vitamin (MULTIVITAMIN WITH MINERALS) TABS tablet Take 1 tablet by mouth daily in the afternoon.    [provider]  omeprazole (PRILOSEC) 40 MG capsule Take 1 capsule (40 mg total) by mouth daily. 10/12/21   [name], NP  potassium chloride (KLOR-CON) 10 MEQ tablet TAKE 2 TABLETS BY MOUTH DAILY FOR 2 DAYS THEN REDUCE TO 1 TABLET BY MOUTH DAILY 04/05/22   Richardson Dopp T, PA-C  rivaroxaban (XARELTO) 20 MG TABS tablet TAKE 1 TABLET BY MOUTH DAILY WITH SUPPER 11/20/21   Nahser, Wonda Cheng, MD  tamsulosin (FLOMAX) 0.4 MG CAPS capsule TAKE 1 CAPSULE (0.4 MG TOTAL) BY MOUTH DAILY. 09/14/21 09/14/22  [name], NP  metFORMIN (GLUCOPHAGE) 500 MG tablet Take 1 tablet (500 mg total) by mouth 2 (two) times daily with a meal. 04/26/21 05/08/21  Margie Billet, NP      Allergies    Iodine and Iohexol    Review of Systems   Review of Systems  Constitutional:  Negative for fever.  HENT:  Negative for ear pain and sore throat.   Eyes:  Negative for pain.  Respiratory:  Negative for cough.   Cardiovascular:  Positive for chest pain.  Gastrointestinal:  Positive for abdominal pain.  Genitourinary:  Negative for flank pain.  Musculoskeletal:  Negative for back pain.  Skin:  Negative for  color change and rash.  Neurological:  Negative for syncope.  All other systems reviewed and are negative.   Physical Exam Updated Vital Signs BP 121/84   Pulse (!) 59   Temp 97.7 F (36.5 C) (Oral)   Resp 16   Ht 5\' 9"  (1.753 m)   Wt 83.9 kg   SpO2 97%   BMI 27.32 kg/m  Physical Exam Constitutional:      Appearance: He is well-developed.  HENT:     Head: Normocephalic.     Nose: Nose normal.  Eyes:     Extraocular Movements: Extraocular movements intact.  Cardiovascular:     Rate and Rhythm: Normal rate.  Pulmonary:     Effort: Pulmonary effort is normal.  Abdominal:     Tenderness: There is no abdominal tenderness.  There is no guarding or rebound.  Skin:    Coloration: Skin is not jaundiced.  Neurological:     Mental Status: He is alert. Mental status is at baseline.     ED Results / Procedures / Treatments   Labs (all labs ordered are listed, but only abnormal results are displayed) Labs Reviewed  BASIC METABOLIC PANEL - Abnormal; Notable for the following components:      Result Value   Glucose, Bld 123 (*)    Calcium 8.7 (*)    All other components within normal limits  CBC - Abnormal; Notable for the following components:   Hemoglobin 11.6 (*)    HCT 38.0 (*)    RDW 17.2 (*)    All other components within normal limits  TROPONIN I (HIGH SENSITIVITY)  TROPONIN I (HIGH SENSITIVITY)    EKG EKG Interpretation  Date/Time:  Monday May 06 2022 13:12:57 EDT Ventricular Rate:  59 PR Interval:  124 QRS Duration: 98 QT Interval:  466 QTC Calculation: 461 R Axis:   14 Text Interpretation: Sinus bradycardia Otherwise normal ECG When compared with ECG of 28-May-2021 13:44, PREVIOUS ECG IS PRESENT Confirmed by Thamas Jaegers ([id number]) on 05/06/2022 2:17:02 PM  Radiology DG Chest 2 View  Result Date: 05/06/2022 CLINICAL DATA:  Chest pain, shortness of breath. EXAM: CHEST - 2 VIEW COMPARISON:  Chest x-ray dated 10/03/2021. Chest CT dated 06/14/2021. Chest x-ray dated 01/28/2018. FINDINGS: Stable postoperative changes of the LEFT chest with chronic LEFT perihilar scarring. Again noted is chronic atelectasis and/or small pleural effusion at the LEFT lung base. RIGHT lung remains clear. Heart size and mediastinal contours are stable. Osseous structures about the chest are unremarkable. IMPRESSION: Stable chest x-ray. No acute findings. No evidence of pneumonia or pulmonary edema. Stable postoperative changes of the LEFT chest. Electronically Signed   By: Franki Cabot M.D.   On: 05/06/2022 13:35    Procedures Procedures    Medications Ordered in ED Medications  diphenhydrAMINE (BENADRYL) capsule 50 mg  (has no administration in time range)    Or  diphenhydrAMINE (BENADRYL) injection 50 mg (has no administration in time range)  iohexol (OMNIPAQUE) 350 MG/ML injection 100 mL (has no administration in time range)  methylPREDNISolone sodium succinate (SOLU-MEDROL) 40 mg/mL injection 40 mg (40 mg Intravenous Given 05/06/22 1434)    ED Course/ Medical Decision Making/ A&P                           Medical Decision Making Amount and/or Complexity of Data Reviewed Labs: ordered. Radiology: ordered.  Risk Prescription drug management.   Review of records shows office visit April 19, 2022 for  diabetes.  Cardiac monitor showing sinus rhythm.  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  Initial troponin is negative.  Chest x-ray is unremarkable.  Patient has epigastric pain radiating up to the neck.  Recent bypass surgery noted.  Concern for possible aortic etiology, however patient has a allergy to contrast.  Premedicated for contrast allergy and pending CT angio.  Patient signed out to oncoming provider.        Final Clinical Impression(s) / ED Diagnoses Final diagnoses:  Chest pain, unspecified type    Rx / DC Orders ED Discharge Orders     None         Luna Fuse, MD 05/06/22 1500

## 2022-05-06 NOTE — ED Notes (Signed)
States he has been having chest pain, left ant area which radiates into the left side of his neck. Pt states he was lying in bed when onset began this past Saturday night. States he did have some shortness of breath. Had some nausea, denies vomiting or diaphoresis. Currently NSR is noted on monitor. Appears in no distress at this time, mae x 4, alert and oriented x 4

## 2022-05-06 NOTE — ED Notes (Signed)
Resting quietly at this time, has "some pain in my neck", up around in room, voiding without assistance, gait very steady, GCS 15.

## 2022-05-06 NOTE — Telephone Encounter (Signed)
Pt called stating he was experiencing the following symptoms:  -L. Side rib cage pain -Shortness of breath  Pt was transferred to triage nurse for further eval.

## 2022-05-06 NOTE — Telephone Encounter (Signed)
Pt in ED.  

## 2022-05-06 NOTE — Telephone Encounter (Signed)
Patient was seen at ed

## 2022-05-06 NOTE — ED Notes (Addendum)
Patient ambulated around nurses station and given hot water so he could make instant coffee.  Patient ambulated with steady gait and denies shob when ambulating. Patient currently off monitor while sitting in chair in room.  Door is open so patient is visualized d/t being low fall risk and impulsive with getting OOB without assistance.

## 2022-05-06 NOTE — ED Notes (Signed)
Patient given clean urinal.

## 2022-05-07 ENCOUNTER — Telehealth: Payer: Self-pay | Admitting: Student

## 2022-05-07 ENCOUNTER — Observation Stay (HOSPITAL_BASED_OUTPATIENT_CLINIC_OR_DEPARTMENT_OTHER): Payer: Medicare Other

## 2022-05-07 ENCOUNTER — Observation Stay (HOSPITAL_COMMUNITY): Payer: Medicare Other

## 2022-05-07 DIAGNOSIS — R112 Nausea with vomiting, unspecified: Secondary | ICD-10-CM | POA: Diagnosis not present

## 2022-05-07 DIAGNOSIS — C3412 Malignant neoplasm of upper lobe, left bronchus or lung: Secondary | ICD-10-CM | POA: Diagnosis not present

## 2022-05-07 DIAGNOSIS — R079 Chest pain, unspecified: Secondary | ICD-10-CM

## 2022-05-07 DIAGNOSIS — J90 Pleural effusion, not elsewhere classified: Secondary | ICD-10-CM

## 2022-05-07 DIAGNOSIS — E119 Type 2 diabetes mellitus without complications: Secondary | ICD-10-CM | POA: Diagnosis not present

## 2022-05-07 DIAGNOSIS — R0609 Other forms of dyspnea: Secondary | ICD-10-CM | POA: Diagnosis not present

## 2022-05-07 DIAGNOSIS — R609 Edema, unspecified: Secondary | ICD-10-CM | POA: Diagnosis not present

## 2022-05-07 DIAGNOSIS — Z87891 Personal history of nicotine dependence: Secondary | ICD-10-CM | POA: Diagnosis not present

## 2022-05-07 DIAGNOSIS — Z7901 Long term (current) use of anticoagulants: Secondary | ICD-10-CM | POA: Diagnosis not present

## 2022-05-07 DIAGNOSIS — I2510 Atherosclerotic heart disease of native coronary artery without angina pectoris: Secondary | ICD-10-CM | POA: Diagnosis not present

## 2022-05-07 DIAGNOSIS — Z79899 Other long term (current) drug therapy: Secondary | ICD-10-CM | POA: Diagnosis not present

## 2022-05-07 DIAGNOSIS — J9691 Respiratory failure, unspecified with hypoxia: Secondary | ICD-10-CM | POA: Diagnosis not present

## 2022-05-07 DIAGNOSIS — J449 Chronic obstructive pulmonary disease, unspecified: Secondary | ICD-10-CM | POA: Diagnosis not present

## 2022-05-07 DIAGNOSIS — Z951 Presence of aortocoronary bypass graft: Secondary | ICD-10-CM | POA: Diagnosis not present

## 2022-05-07 DIAGNOSIS — I480 Paroxysmal atrial fibrillation: Secondary | ICD-10-CM | POA: Diagnosis not present

## 2022-05-07 DIAGNOSIS — Z7984 Long term (current) use of oral hypoglycemic drugs: Secondary | ICD-10-CM | POA: Diagnosis not present

## 2022-05-07 DIAGNOSIS — R0602 Shortness of breath: Secondary | ICD-10-CM | POA: Diagnosis present

## 2022-05-07 DIAGNOSIS — G40909 Epilepsy, unspecified, not intractable, without status epilepticus: Secondary | ICD-10-CM | POA: Diagnosis not present

## 2022-05-07 DIAGNOSIS — R846 Abnormal cytological findings in specimens from respiratory organs and thorax: Secondary | ICD-10-CM | POA: Diagnosis not present

## 2022-05-07 LAB — CBC WITH DIFFERENTIAL/PLATELET
Abs Immature Granulocytes: 0.04 10*3/uL (ref 0.00–0.07)
Basophils Absolute: 0 10*3/uL (ref 0.0–0.1)
Basophils Relative: 0 %
Eosinophils Absolute: 0 10*3/uL (ref 0.0–0.5)
Eosinophils Relative: 0 %
HCT: 41.3 % (ref 39.0–52.0)
Hemoglobin: 12.7 g/dL — ABNORMAL LOW (ref 13.0–17.0)
Immature Granulocytes: 1 %
Lymphocytes Relative: 9 %
Lymphs Abs: 0.7 10*3/uL (ref 0.7–4.0)
MCH: 27 pg (ref 26.0–34.0)
MCHC: 30.8 g/dL (ref 30.0–36.0)
MCV: 87.7 fL (ref 80.0–100.0)
Monocytes Absolute: 0.3 10*3/uL (ref 0.1–1.0)
Monocytes Relative: 4 %
Neutro Abs: 6.9 10*3/uL (ref 1.7–7.7)
Neutrophils Relative %: 86 %
Platelets: 282 10*3/uL (ref 150–400)
RBC: 4.71 MIL/uL (ref 4.22–5.81)
RDW: 17.2 % — ABNORMAL HIGH (ref 11.5–15.5)
WBC: 8 10*3/uL (ref 4.0–10.5)
nRBC: 0 % (ref 0.0–0.2)

## 2022-05-07 LAB — COMPREHENSIVE METABOLIC PANEL
ALT: 16 U/L (ref 0–44)
AST: 19 U/L (ref 15–41)
Albumin: 3.8 g/dL (ref 3.5–5.0)
Alkaline Phosphatase: 92 U/L (ref 38–126)
Anion gap: 11 (ref 5–15)
BUN: 20 mg/dL (ref 8–23)
CO2: 25 mmol/L (ref 22–32)
Calcium: 9.2 mg/dL (ref 8.9–10.3)
Chloride: 105 mmol/L (ref 98–111)
Creatinine, Ser: 0.86 mg/dL (ref 0.61–1.24)
GFR, Estimated: >60 mL/min (ref >60)
Glucose, Bld: 176 mg/dL — ABNORMAL HIGH (ref 70–99)
Potassium: 4.1 mmol/L (ref 3.5–5.1)
Sodium: 141 mmol/L (ref 135–145)
Total Bilirubin: 0.6 mg/dL (ref 0.3–1.2)
Total Protein: 7.2 g/dL (ref 6.5–8.1)

## 2022-05-07 LAB — LACTATE DEHYDROGENASE, PLEURAL OR PERITONEAL FLUID: LD, Fluid: 69 U/L — ABNORMAL HIGH (ref 3–23)

## 2022-05-07 LAB — GLUCOSE, PLEURAL OR PERITONEAL FLUID: Glucose, Fluid: 135 mg/dL

## 2022-05-07 LAB — BODY FLUID CELL COUNT WITH DIFFERENTIAL
Lymphs, Fluid: 71 %
Monocyte-Macrophage-Serous Fluid: 23 % — ABNORMAL LOW (ref 50–90)
Neutrophil Count, Fluid: 6 % (ref 0–25)
Total Nucleated Cell Count, Fluid: 562 cu mm (ref 0–1000)

## 2022-05-07 LAB — LACTATE DEHYDROGENASE: LDH: 147 U/L (ref 98–192)

## 2022-05-07 LAB — PROTEIN, PLEURAL OR PERITONEAL FLUID: Total protein, fluid: 3.6 g/dL

## 2022-05-07 LAB — PHOSPHORUS: Phosphorus: 3.5 mg/dL (ref 2.5–4.6)

## 2022-05-07 LAB — BRAIN NATRIURETIC PEPTIDE: B Natriuretic Peptide: 203.6 pg/mL — ABNORMAL HIGH (ref 0.0–100.0)

## 2022-05-07 LAB — MAGNESIUM: Magnesium: 2 mg/dL (ref 1.7–2.4)

## 2022-05-07 MED ORDER — ATORVASTATIN CALCIUM 40 MG PO TABS
80.0000 mg | ORAL_TABLET | Freq: Every day | ORAL | Status: DC
  Administered 2022-05-07 – 2022-05-08 (×2): 80 mg via ORAL
  Filled 2022-05-07 (×2): qty 2

## 2022-05-07 MED ORDER — BUDESON-GLYCOPYRROL-FORMOTEROL 160-9-4.8 MCG/ACT IN AERO
2.0000 | INHALATION_SPRAY | Freq: Two times a day (BID) | RESPIRATORY_TRACT | Status: DC
  Administered 2022-05-07: 2 via RESPIRATORY_TRACT

## 2022-05-07 MED ORDER — HYDROMORPHONE HCL 1 MG/ML IJ SOLN
0.5000 mg | INTRAMUSCULAR | Status: DC | PRN
  Administered 2022-05-07: 0.5 mg via INTRAVENOUS
  Filled 2022-05-07: qty 0.5

## 2022-05-07 MED ORDER — ACETAMINOPHEN 325 MG PO TABS: 650.0000 mg | ORAL_TABLET | Freq: Four times a day (QID) | ORAL | Status: DC | PRN

## 2022-05-07 MED ORDER — ONDANSETRON HCL 4 MG/2ML IJ SOLN: 4.0000 mg | Freq: Four times a day (QID) | INTRAMUSCULAR | Status: DC | PRN

## 2022-05-07 MED ORDER — FUROSEMIDE 40 MG PO TABS
40.0000 mg | ORAL_TABLET | Freq: Every day | ORAL | Status: DC
  Administered 2022-05-07 – 2022-05-08 (×2): 40 mg via ORAL
  Filled 2022-05-07 (×2): qty 1

## 2022-05-07 MED ORDER — ALBUTEROL SULFATE (2.5 MG/3ML) 0.083% IN NEBU: 2.5000 mg | INHALATION_SOLUTION | Freq: Four times a day (QID) | RESPIRATORY_TRACT | Status: DC | PRN

## 2022-05-07 MED ORDER — POLYETHYLENE GLYCOL 3350 17 G PO PACK: 17.0000 g | PACK | Freq: Every day | ORAL | Status: DC | PRN

## 2022-05-07 MED ORDER — ADULT MULTIVITAMIN W/MINERALS CH
1.0000 | ORAL_TABLET | Freq: Every day | ORAL | Status: DC
Start: 2022-05-07 — End: 2022-05-08
  Administered 2022-05-07: 1 via ORAL
  Filled 2022-05-07: qty 1

## 2022-05-07 MED ORDER — ALBUTEROL SULFATE HFA 108 (90 BASE) MCG/ACT IN AERS: 2.0000 | INHALATION_SPRAY | Freq: Four times a day (QID) | RESPIRATORY_TRACT | Status: DC | PRN

## 2022-05-07 MED ORDER — LEVETIRACETAM ER 750 MG PO TB24
750.0000 mg | ORAL_TABLET | Freq: Every day | ORAL | Status: DC
  Administered 2022-05-07: 750 mg via ORAL

## 2022-05-07 MED ORDER — ENOXAPARIN SODIUM 40 MG/0.4ML IJ SOSY
40.0000 mg | PREFILLED_SYRINGE | INTRAMUSCULAR | Status: DC
  Administered 2022-05-07: 40 mg via SUBCUTANEOUS
  Filled 2022-05-07 (×2): qty 0.4

## 2022-05-07 MED ORDER — ORAL CARE MOUTH RINSE
15.0000 mL | OROMUCOSAL | Status: DC | PRN
Start: 2022-05-07 — End: 2022-05-08

## 2022-05-07 MED ORDER — TAMSULOSIN HCL 0.4 MG PO CAPS
0.4000 mg | ORAL_CAPSULE | Freq: Every day | ORAL | Status: DC
  Administered 2022-05-07 – 2022-05-08 (×2): 0.4 mg via ORAL
  Filled 2022-05-07 (×2): qty 1

## 2022-05-07 MED ORDER — PANTOPRAZOLE SODIUM 40 MG PO TBEC
40.0000 mg | DELAYED_RELEASE_TABLET | Freq: Every day | ORAL | Status: DC
  Administered 2022-05-07 – 2022-05-08 (×2): 40 mg via ORAL
  Filled 2022-05-07 (×2): qty 1

## 2022-05-07 MED ORDER — OXYCODONE HCL 5 MG PO TABS: 5.0000 mg | ORAL_TABLET | Freq: Four times a day (QID) | ORAL | Status: DC | PRN

## 2022-05-07 NOTE — Progress Notes (Signed)
Post thoracentesis vital signs, patient has no respiratory distress, vital signs stable

## 2022-05-07 NOTE — Procedures (Signed)
Thoracentesis  Procedure Note  Addam Goeller  [id number]  [birth year]  Date:05/07/22  Time:2:01 PM   Provider Performing:Haydon Dorris M Verlee Monte   Procedure: Thoracentesis with imaging guidance (00712)  Indication(s) Pleural Effusion  Consent Risks of the procedure as well as the alternatives and risks of each were explained to the patient and/or caregiver.  Consent for the procedure was obtained and is signed in the bedside chart  Anesthesia Topical only with 1% lidocaine    Time Out Verified patient identification, verified procedure, site/side was marked, verified correct patient position, special equipment/implants available, medications/allergies/relevant history reviewed, required imaging and test results available.   Sterile Technique Maximal sterile technique including full sterile barrier drape, hand hygiene, sterile gown, sterile gloves, mask, hair covering, sterile ultrasound probe cover (if used).  Procedure Description Ultrasound was used to identify appropriate pleural anatomy for placement and overlying skin marked.  Area of drainage cleaned and draped in sterile fashion. Lidocaine was used to anesthetize the skin and subcutaneous tissue.  1000 cc's of yellow appearing fluid was drained from the left pleural space. Catheter then removed and bandaid applied to site.      Complications/Tolerance None; patient tolerated the procedure well. Chest X-ray is ordered to confirm no post-procedural complication.   EBL Minimal   Specimen(s) Pleural fluid sent for cyto, micro, chem

## 2022-05-07 NOTE — Telephone Encounter (Signed)
Can we set up clinic follow up in 1-2 weeks with Shearon Stalls, APP, or me to review thora results?  Thanks!

## 2022-05-07 NOTE — Progress Notes (Addendum)
PROGRESS NOTE        PATIENT DETAILS Name: Robert Bates Age: 81 y.o. Sex: male Date of Birth: 04-18-1941 Admit Date: 05/06/2022 Admitting Physician Vernelle Emerald, MD PCP:O'Sullivan, Lenna Sciara, NP  Brief Summary: Patient is a 81 y.o.  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  In the setting of CABG)-presented with exertional dyspnea-found to have a large left-sided pleural effusion and subsequently admitted to the hospitalist service.  Significant events: 7/3>> presented with left-sided chest pain/exertional dyspnea-found to have large left pleural effusion.  Significant studies: 7/3>> CTA chest: Negative for aortic dissection-no large central PE-moderate/large left pleural effusion.  Significant microbiology data:   Procedures:   Consults: Pulmonology  Subjective: Lying comfortably in bed-denies any chest pain or shortness of breath.  Objective: Vitals: Blood pressure 114/75, pulse 61, temperature 98.6 F (37 C), temperature source Oral, resp. rate 17, height 5\' 9"  (1.753 m), weight 80.8 kg, SpO2 96 %.   Exam: Gen Exam:Alert awake-not in any distress HEENT:atraumatic, normocephalic Chest: B/L clear to auscultation anteriorly CVS:S1S2 regular Abdomen:soft non tender, non distended Extremities: Left> right edema Neurology: Non focal Skin: no rash  Pertinent Labs/Radiology:    Latest Ref Rng & Units 05/07/2022    4:47 AM 05/06/2022    1:23 PM 06/14/2021    3:13 PM  CBC  WBC 4.0 - 10.5 K/uL 8.0  7.2  6.8   Hemoglobin 13.0 - 17.0 g/dL 12.7  11.6  10.4   Hematocrit 39.0 - 52.0 % 41.3  38.0  32.4   Platelets 150 - 400 K/uL 282  227  247     Lab Results  Component Value Date   NA 141 05/07/2022   K 4.1 05/07/2022   CL 105 05/07/2022   CO2 25 05/07/2022      Assessment/Plan: Symptomatic large  left-sided pleural effusion: Unclear whether this is a transudate (?  CHF) or exudative in the setting of possible malignancy (history of lung CA).  PCCM consulted for thoracocentesis.  Transient left-sided chest pain: Resolved-troponins negative-EKG nonacute-CTA chest negative for dissection and large PE (low suspicion for peripheral PE as patient is on Xarelto).  PAF: Rate controlled-holding Xarelto until thoracocentesis done.  CAD s/p CABG 2022: No anginal symptoms  Chronic HFpEF: Mild lower extremity edema-but otherwise compensated-resume daily furosemide.  Seizure disorder: Continue Keppra  History of lung CA: Followed by Dr. Carollee Sires radiation 2016-has been on observation since then.  Now with pleural effusion-see above.  COPD: Not in exacerbation-continue bronchodilators  BPH: Continue Flomax  Large stellate bladder calculi: Incidental finding on CT chest-continue observation/monitoring.  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  2.3 cm dense midpole lesion of left kidney: Radiology recommending outpatient dedicated MRI  BMI: Estimated body mass index is 26.31 kg/m as calculated from the following:   Height as of this encounter: 5\' 9"  (1.753 m).   Weight as of this encounter: 80.8 kg.   Code status:   Code Status: Full Code   DVT Prophylaxis: enoxaparin (LOVENOX) injection 40 mg Start: 05/07/22 1000   Family Communication: Spouse-Kathy-[phone]-updated over the phone on 7/4   Disposition Plan: Status is: Observation The patient will require care spanning > 2 midnights and should be  moved to inpatient because: Symptomatic pleural effusion-awaiting thoracocentesis to determine whether this is an exudate/transudate before making discharge plans.  IR not available for thoracocentesis-PCCM will perform thoracocentesis.   Planned Discharge Destination:Home   Diet: Diet Order              Diet heart healthy/carb modified Room service appropriate? Yes; Fluid consistency: Thin  Diet effective now                     Antimicrobial agents: Anti-infectives (From admission, onward)    None        MEDICATIONS: Scheduled Meds:  atorvastatin  80 mg Oral Daily   Budeson-Glycopyrrol-Formoterol  2 puff Inhalation BID   enoxaparin (LOVENOX) injection  40 mg Subcutaneous Q24H   Levetiracetam  750 mg Oral QHS   multivitamin with minerals  1 tablet Oral Q1500   pantoprazole  40 mg Oral Daily   tamsulosin  0.4 mg Oral Daily   Continuous Infusions: PRN Meds:.acetaminophen, albuterol, HYDROmorphone (DILAUDID) injection, ondansetron (ZOFRAN) IV, mouth rinse, oxyCODONE, polyethylene glycol   I have personally reviewed following labs and imaging studies  LABORATORY DATA: CBC: Recent Labs  Lab 05/06/22 1323 05/07/22 0447  WBC 7.2 8.0  NEUTROABS  --  6.9  HGB 11.6* 12.7*  HCT 38.0* 41.3  MCV 89.0 87.7  PLT 227 093    Basic Metabolic Panel: Recent Labs  Lab 05/06/22 1323 05/07/22 0447  NA 141 141  K 4.3 4.1  CL 105 105  CO2 27 25  GLUCOSE 123* 176*  BUN 15 20  CREATININE 0.90 0.86  CALCIUM 8.7* 9.2  MG  --  2.0  PHOS  --  3.5    GFR: Estimated Creatinine Clearance: 68.5 mL/min (by C-G formula based on SCr of 0.86 mg/dL).  Liver Function Tests: Recent Labs  Lab 05/07/22 0447  AST 19  ALT 16  ALKPHOS 92  BILITOT 0.6  PROT 7.2  ALBUMIN 3.8   No results for input(s): "LIPASE", "AMYLASE" in the last 168 hours. No results for input(s): "AMMONIA" in the last 168 hours.  Coagulation Profile: No results for input(s): "INR", "PROTIME" in the last 168 hours.  Cardiac Enzymes: No results for input(s): "CKTOTAL", "CKMB", "CKMBINDEX", "TROPONINI" in the last 168 hours.  BNP (last 3 results) No results for input(s): "PROBNP" in the last 8760 hours.  Lipid Profile: No results for input(s): "CHOL", "HDL", "LDLCALC", "TRIG", "CHOLHDL", "LDLDIRECT"  in the last 72 hours.  Thyroid Function Tests: No results for input(s): "TSH", "T4TOTAL", "FREET4", "T3FREE", "THYROIDAB" in the last 72 hours.  Anemia Panel: No results for input(s): "VITAMINB12", "FOLATE", "FERRITIN", "TIBC", "IRON", "RETICCTPCT" in the last 72 hours.  Urine analysis:    Component Value Date/Time   COLORURINE AMBER (A) 05/04/2021 1430   APPEARANCEUR HAZY (A) 05/04/2021 1430   LABSPEC 1.026 05/04/2021 1430   PHURINE 5.0 05/04/2021 1430   GLUCOSEU NEGATIVE 05/04/2021 1430   GLUCOSEU NEGATIVE 04/27/2018 1509   HGBUR SMALL (A) 05/04/2021 1430   HGBUR trace-lysed 06/08/2010 1317   BILIRUBINUR Negative 03/15/2022 0925   KETONESUR NEGATIVE 05/04/2021 1430   PROTEINUR Negative 03/15/2022 0925   PROTEINUR NEGATIVE 05/04/2021 1430   UROBILINOGEN 0.2 03/15/2022 0925   UROBILINOGEN 0.2 04/27/2018 1509   NITRITE Negative 03/15/2022 0925   NITRITE NEGATIVE 05/04/2021 1430   LEUKOCYTESUR Negative 03/15/2022 0925   LEUKOCYTESUR NEGATIVE 05/04/2021 1430    Sepsis Labs: Lactic Acid, Venous No results found for: "LATICACIDVEN"  MICROBIOLOGY: No results found for this or  any previous visit (from the past 240 hour(s)).  RADIOLOGY STUDIES/RESULTS: CT Angio Chest/Abd/Pel for Dissection W and/or Wo Contrast  Result Date: 05/06/2022 CLINICAL DATA:  Chest and back pain EXAM: CT ANGIOGRAPHY CHEST, ABDOMEN AND PELVIS TECHNIQUE: Non-contrast CT of the chest was initially obtained. Multidetector CT imaging through the chest, abdomen and pelvis was performed using the standard protocol during bolus administration of intravenous contrast. Multiplanar reconstructed images and MIPs were obtained and reviewed to evaluate the vascular anatomy. RADIATION DOSE REDUCTION: This exam was performed according to the departmental dose-optimization program which includes automated exposure control, adjustment of the mA and/or kV according to patient size and/or use of iterative reconstruction  technique. CONTRAST:  143mL OMNIPAQUE IOHEXOL 350 MG/ML SOLN COMPARISON:  Chest x-ray 05/06/2022, CT chest 06/14/2021, CT abdomen pelvis 07/12/2017, CT chest 04/20/2021, 01/02/2018 FINDINGS: CTA CHEST FINDINGS Cardiovascular: Non contrasted images of the chest demonstrate no acute intramural hematoma. Moderate aortic atherosclerosis. Maximum ascending aortic diameter of 3.9 cm. Post CABG changes. No dissection is seen. No filling defects within the central pulmonary arteries to suggest an embolus. Coronary vascular calcification. Atrial appendage clip. Normal cardiac size. No pericardial effusion Mediastinum/Nodes: Midline trachea. No thyroid mass. No suspicious lymph nodes. Moderate hiatal hernia. Lungs/Pleura: Moderate to large left pleural effusion. This is increased. Similar distortion and consolidation within the left upper lobe and superior segment left lower lobe consistent with post therapy changes. Musculoskeletal: Post sternotomy changes. Stable sclerotic lesion in T8, likely a bone island. No acute osseous abnormality. Review of the MIP images confirms the above findings. CTA ABDOMEN AND PELVIS FINDINGS VASCULAR Aorta: Moderate aortic atherosclerosis. Mild aneurysmal dilatation of the upper abdominal aorta at the level of renal arteries measuring up to 3.1 cm. No dissection or occlusive disease. Celiac: Common origin with the superior mesenteric artery. No significant stenosis, aneurysm or occlusion SMA: Patent without evidence of aneurysm, dissection, vasculitis or significant stenosis. Renals: Single right and single left renal arteries are patent. IMA: Patent without evidence of aneurysm, dissection, vasculitis or significant stenosis. Inflow: Patent without evidence of aneurysm, dissection, vasculitis or significant stenosis. Mild to moderate atherosclerosis Review of the MIP images confirms the above findings. NON-VASCULAR Hepatobiliary: Multiple hypodense liver lesions without change and likely due  to cysts or small hemangioma. Status post cholecystectomy. No biliary dilatation Pancreas: Unremarkable. No pancreatic ductal dilatation or surrounding inflammatory changes. Spleen: Hypodense lesions at the splenic hilus without change since 2018 and felt benign. Adrenals/Urinary Tract: Right adrenal gland is normal. Stable fullness of left adrenal gland without dominant mass. Scarring upper pole right kidney. No hydronephrosis. Increased size of exophytic lesion off the mid pole left kidney now measuring 2.3 cm, previously 1.2 cm. The bladder shows a large stellate calcification in the right posterior lumen. Stomach/Bowel: The stomach is nonenlarged. No dilated small bowel. No acute bowel wall thickening. Negative appendix. Diverticular disease of the colon. Lymphatic: No suspicious lymph nodes. Reproductive: Prostate is unremarkable. Other: Negative for pelvic effusion or free air. Small fat containing umbilical hernia. Musculoskeletal: No acute or significant osseous findings. Review of the MIP images confirms the above findings. IMPRESSION: 1. Negative for acute aortic dissection. 2. Moderate large left pleural effusion is increased compared to prior. Stable distortion and consolidation in the left upper lobe and perihilar region compared with multiple previous exams and presumably due to post therapy change. 3. Mild aneurysmal dilatation of the upper abdominal aorta up to 3.1 cm. Recommend follow-up ultrasound every 3 years. This recommendation follows ACR consensus guidelines: White Paper of  the ACR Incidental Findings Committee II on Vascular Findings. J Am Coll Radiol 2013; 10:789-794. 4. No CT evidence for acute intra-abdominal or pelvic abnormality 5. Large stellate bladder calculus 6. Increased size slightly dense midpole lesion left kidney now measuring 2.3 cm. When the patient is clinically stable and able to follow directions and hold their breath (preferably as an outpatient) further evaluation with  dedicated abdominal MRI should be considered. Electronically Signed   By: Donavan Foil M.D.   On: 05/06/2022 19:22   DG Chest 2 View  Result Date: 05/06/2022 CLINICAL DATA:  Chest pain, shortness of breath. EXAM: CHEST - 2 VIEW COMPARISON:  Chest x-ray dated 10/03/2021. Chest CT dated 06/14/2021. Chest x-ray dated 01/28/2018. FINDINGS: Stable postoperative changes of the LEFT chest with chronic LEFT perihilar scarring. Again noted is chronic atelectasis and/or small pleural effusion at the LEFT lung base. RIGHT lung remains clear. Heart size and mediastinal contours are stable. Osseous structures about the chest are unremarkable. IMPRESSION: Stable chest x-ray. No acute findings. No evidence of pneumonia or pulmonary edema. Stable postoperative changes of the LEFT chest. Electronically Signed   By: Franki Cabot M.D.   On: 05/06/2022 13:35     LOS: 0 days   Oren Binet, MD  [facility]    To contact the attending provider between 7A-7P or the covering provider during after hours 7P-7A, please log into the web site [email or web address] and access using universal [place] password for that web site. If you do not have the password, please call the hospital operator.  05/07/2022, 1:04 PM

## 2022-05-07 NOTE — Plan of Care (Signed)

## 2022-05-07 NOTE — H&P (Signed)
History and Physical  Robert Bates WUG:[id number] DOB: August 08, 1941 DOA: 05/06/2022  Referring physician: Accepted and admitted by Dr. Cyd Silence, [facility]. PCP: [name], NP  Outpatient Specialists: Neurology, pulmonary, cardiology. Patient coming from: Home via [facility] ED  Chief Complaint:  Shortness of breath and left-sided chest pain.  HPI: Robert Bates is a 81 y.o. 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  Associated with nausea and no vomiting.  He presented to the ED for further evaluation.  Work-up revealed CTA negative for PE however revealing moderate to large left pleural effusion.  EDP requested admission by hospitalist service.  The patient was accepted by Dr. Cyd Silence, [facility] and transferred to [facility] long hospital to telemetry unit as observation status.  At the time of the visit, his shortness of breath and chest pain resolved.  Admits to intermittent indigestion.  ED Course: Tmax 98.6.  BP 1 4792, pulse 74, respiratory 16, saturation 95% on room air.  CBC essentially unremarkable, except for hemoglobin 11.6.  Review of Systems: Review of systems as noted in the HPI. All other systems reviewed and are negative.   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   COPD (chronic obstructive pulmonary disease) (HCC)    Coronary artery disease    Diabetes type 2, controlled ([place]) 07/31/2017   Dyspnea    Dysrhythmia    a fib   GERD  (gastroesophageal reflux disease)    Hematuria    refuses work up or referral - understands risks of morbidity / mortality - 11/2008, 12/2008   History of hiatal hernia    History of kidney stones    Hyperlipemia    Meningioma ([place]) 10/25/2013   Follows with Dr. [name].    Peripheral vascular disease ([place])    Abdominal Aortic Aneursym   Pneumonia    as a child   Radiation 09/18/15-10/25/15   left lower lobe 70.2 Gy   Seizures ([place]) 02/18/2020   Tobacco abuse    Past Surgical History:  Procedure Laterality Date   CHOLECYSTECTOMY N/A 07/23/2017   Procedure: LAPAROSCOPIC CHOLECYSTECTOMY;  Surgeon: Mickeal Skinner, MD;  Location: [facility];  Service: General;  Laterality: N/A;   CLIPPING OF ATRIAL APPENDAGE Left 05/08/2021   Procedure: CLIPPING OF ATRIAL APPENDAGE USING 53 ATRICLIP;  Surgeon: Wonda Olds, MD;  Location: [place];  Service: Open Heart Surgery;  Laterality: Left;   COLONOSCOPY     CORONARY ARTERY BYPASS GRAFT N/A 05/08/2021   Procedure: CORONARY ARTERY BYPASS GRAFTING (CABG)X 3 USING LEFT INTERNAL MAMMARY ARTERY AND RIGHT GREATER SAPEHNOUS VEIN;  Surgeon: Wonda Olds, MD;  Location: [place];  Service: Open Heart Surgery;  Laterality: N/A;   ENDOVEIN HARVEST OF GREATER SAPHENOUS VEIN Right 05/08/2021   Procedure: ENDOVEIN HARVEST OF GREATER SAPHENOUS VEIN;  Surgeon: Wonda Olds, MD;  Location: [place];  Service: Open Heart Surgery;  Laterality: Right;   EYE SURGERY Bilateral    Cataracts removed w/ lens implant  HERNIA REPAIR     Left 36 years ago . Right inguinal hernia repair 10-01-17 Dr. Kieth Brightly   INGUINAL HERNIA REPAIR Right 10/01/2017   Procedure: RIGHT INGUINAL HERNIA REPAIR WITH MESH;  Surgeon: Kinsinger, Arta Bruce, MD;  Location: [facility];  Service: General;  Laterality: Right;  TAP BLOCK   INSERTION OF MESH Right 10/01/2017   Procedure: INSERTION OF MESH;  Surgeon: Kieth Brightly Arta Bruce, MD;  Location: [facility];  Service: General;  Laterality: Right;    IR THORACENTESIS ASP PLEURAL SPACE W/IMG GUIDE  05/18/2021   IR THORACENTESIS ASP PLEURAL SPACE W/IMG GUIDE  06/07/2021   LEFT HEART CATH AND CORONARY ANGIOGRAPHY N/A 04/20/2021   Procedure: LEFT HEART CATH AND CORONARY ANGIOGRAPHY;  Surgeon: Wellington Hampshire, MD;  Location: [place] [facility];  Service: Cardiovascular;  Laterality: N/A;   TEE WITHOUT CARDIOVERSION N/A 05/08/2021   Procedure: TRANSESOPHAGEAL ECHOCARDIOGRAM (TEE);  Surgeon: Wonda Olds, MD;  Location: [place];  Service: Open Heart Surgery;  Laterality: N/A;   TONSILLECTOMY     TONSILLECTOMY     VIDEO BRONCHOSCOPY Bilateral 07/26/2015   Procedure: VIDEO BRONCHOSCOPY WITH FLUORO;  Surgeon: [name], MD;  [facility];  Service: Cardiopulmonary;  Laterality: Bilateral;   VIDEO BRONCHOSCOPY WITH ENDOBRONCHIAL NAVIGATION N/A 08/23/2015   Procedure: VIDEO BRONCHOSCOPY WITH ENDOBRONCHIAL NAVIGATION;  Surgeon: Grace Isaac, MD;  Location: [place];  Service: Thoracic;  Laterality: N/A;   VIDEO BRONCHOSCOPY WITH ENDOBRONCHIAL ULTRASOUND N/A 08/23/2015   Procedure: VIDEO BRONCHOSCOPY WITH ENDOBRONCHIAL ULTRASOUND;  Surgeon: Grace Isaac, MD;  Location: [place];  Service: Thoracic;  Laterality: N/A;    Social History:  reports that he quit smoking about 6 years ago. His smoking use included cigarettes and cigars. He has a 57.00 pack-year smoking history. He quit smokeless tobacco use about 63 years ago.  His smokeless tobacco use included chew. He reports that he does not currently use alcohol. He reports that he does not use drugs.   Allergies  Allergen Reactions   Iodine Other (See Comments)    neck swells   Iohexol Swelling    Neck and gland swelling per patient.    Family History  Problem Relation Age of Onset   Leukemia Father    Emphysema Father    Learning disabilities Son    Atrial fibrillation Son    Leukemia Other    Stroke Other       Prior to Admission medications   Medication Sig Start  Date End Date Taking? Authorizing Provider  acetaminophen (TYLENOL) 500 MG tablet Take 1-2 tablets (500-1,000 mg total) by mouth every 6 (six) hours as needed. 05/16/21   Nani Skillern, PA-C  albuterol (PROVENTIL) (2.5 MG/3ML) 0.083% nebulizer solution Take 3 mLs (2.5 mg total) by nebulization every 6 (six) hours as needed for wheezing or shortness of breath. 10/17/21   Spero Geralds, MD  albuterol (VENTOLIN HFA) 108 (90 Base) MCG/ACT inhaler Inhale 2 puffs into the lungs every 6 (six) hours as needed for wheezing or shortness of breath. 07/27/21   [name], NP  amiodarone (PACERONE) 200 MG tablet Take 1/2 tablet (100 mg total) by mouth daily. 04/05/22   Richardson Dopp T, PA-C  atorvastatin (LIPITOR) 80 MG tablet Take 1 tablet (80 mg total) by mouth daily. 11/09/21   Nahser, Wonda Cheng, MD  Budeson-Glycopyrrol-Formoterol (BREZTRI AEROSPHERE) 160-9-4.8 MCG/ACT AERO Inhale 2 puffs into the lungs in the morning and at bedtime. 10/17/21   Spero Geralds, MD  furosemide (LASIX) 20 MG tablet TAKE 2 TABLETS BY MOUTH DAILY FOR 2 DAYS THEN REDUCE TO 1 TABLET BY MOUTH DAILY 04/05/22   Richardson Dopp T, PA-C  gabapentin (NEURONTIN) 300 MG capsule Take 1 capsule (300 mg total) by mouth 3 (three) times daily. 01/15/22   [name], NP  Levetiracetam 750 MG TB24 Take 1 tablet (750 mg total) by mouth at bedtime. 04/12/22   Alric Ran, MD  Multiple Vitamin (MULTIVITAMIN WITH MINERALS) TABS tablet Take 1 tablet by mouth daily in the afternoon.    [provider]  omeprazole (PRILOSEC) 40 MG capsule Take 1 capsule (40 mg total) by mouth daily. 10/12/21   [name], NP  potassium chloride (KLOR-CON) 10 MEQ tablet TAKE 2 TABLETS BY MOUTH DAILY FOR 2 DAYS THEN REDUCE TO 1 TABLET BY MOUTH DAILY 04/05/22   Richardson Dopp T, PA-C  rivaroxaban (XARELTO) 20 MG TABS tablet TAKE 1 TABLET BY MOUTH DAILY WITH SUPPER 11/20/21   Nahser, Wonda Cheng, MD  tamsulosin (FLOMAX) 0.4 MG CAPS capsule TAKE 1  CAPSULE (0.4 MG TOTAL) BY MOUTH DAILY. 09/14/21 09/14/22  [name], NP  metFORMIN (GLUCOPHAGE) 500 MG tablet Take 1 tablet (500 mg total) by mouth 2 (two) times daily with a meal. 04/26/21 05/08/21  Margie Billet, NP    Physical Exam: BP (!) 147/92 (BP Location: Right Arm)   Pulse 74   Temp 98.6 F (37 C) (Oral)   Resp 16   Ht 5\' 9"  (1.753 m)   Wt 80.8 kg   SpO2 95%   BMI 26.31 kg/m   General: 81 y.o. year-old male well developed well nourished in no acute distress.  Alert and oriented x3. Cardiovascular: Regular rate and rhythm with no rubs or gallops.  No thyromegaly or JVD noted.  Trace lower extremity edema. 2/4 pulses in all 4 extremities. Respiratory: Faint rales at bases.  No wheezing noted.  Poor inspiratory effort. Abdomen: Soft nontender nondistended with normal bowel sounds x4 quadrants. Muskuloskeletal: No cyanosis, clubbing or edema noted bilaterally Neuro: CN II-XII intact, strength, sensation, reflexes Skin: No ulcerative lesions noted or rashes Psychiatry: Judgement and insight appear normal. Mood is appropriate for condition and setting          Labs on Admission:  Basic Metabolic Panel: Recent Labs  Lab 05/06/22 1323  NA 141  K 4.3  CL 105  CO2 27  GLUCOSE 123*  BUN 15  CREATININE 0.90  CALCIUM 8.7*   Liver Function Tests: No results for input(s): "AST", "ALT", "ALKPHOS", "BILITOT", "PROT", "ALBUMIN" in the last 168 hours. No results for input(s): "LIPASE", "AMYLASE" in the last 168 hours. No results for input(s): "AMMONIA" in the last 168 hours. CBC: Recent Labs  Lab 05/06/22 1323  WBC 7.2  HGB 11.6*  HCT 38.0*  MCV 89.0  PLT 227   Cardiac Enzymes: No results for input(s): "CKTOTAL", "CKMB", "CKMBINDEX", "TROPONINI" in the last 168 hours.  BNP (last 3 results) No results for input(s): "BNP" in the last 8760 hours.  ProBNP (last 3 results) No results for input(s): "PROBNP" in the last 8760 hours.  CBG: No results for input(s):  "GLUCAP" in the last 168 hours.  Radiological Exams on Admission: CT Angio Chest/Abd/Pel for Dissection W and/or Wo Contrast  Result Date: 05/06/2022 CLINICAL DATA:  Chest and back pain EXAM: CT ANGIOGRAPHY CHEST, ABDOMEN AND PELVIS TECHNIQUE: Non-contrast CT of the chest was initially obtained. Multidetector CT imaging through the chest, abdomen and pelvis was performed using the standard protocol  during bolus administration of intravenous contrast. Multiplanar reconstructed images and MIPs were obtained and reviewed to evaluate the vascular anatomy. RADIATION DOSE REDUCTION: This exam was performed according to the departmental dose-optimization program which includes automated exposure control, adjustment of the mA and/or kV according to patient size and/or use of iterative reconstruction technique. CONTRAST:  158mL OMNIPAQUE IOHEXOL 350 MG/ML SOLN COMPARISON:  Chest x-ray 05/06/2022, CT chest 06/14/2021, CT abdomen pelvis 07/12/2017, CT chest 04/20/2021, 01/02/2018 FINDINGS: CTA CHEST FINDINGS Cardiovascular: Non contrasted images of the chest demonstrate no acute intramural hematoma. Moderate aortic atherosclerosis. Maximum ascending aortic diameter of 3.9 cm. Post CABG changes. No dissection is seen. No filling defects within the central pulmonary arteries to suggest an embolus. Coronary vascular calcification. Atrial appendage clip. Normal cardiac size. No pericardial effusion Mediastinum/Nodes: Midline trachea. No thyroid mass. No suspicious lymph nodes. Moderate hiatal hernia. Lungs/Pleura: Moderate to large left pleural effusion. This is increased. Similar distortion and consolidation within the left upper lobe and superior segment left lower lobe consistent with post therapy changes. Musculoskeletal: Post sternotomy changes. Stable sclerotic lesion in T8, likely a bone island. No acute osseous abnormality. Review of the MIP images confirms the above findings. CTA ABDOMEN AND PELVIS FINDINGS VASCULAR  Aorta: Moderate aortic atherosclerosis. Mild aneurysmal dilatation of the upper abdominal aorta at the level of renal arteries measuring up to 3.1 cm. No dissection or occlusive disease. Celiac: Common origin with the superior mesenteric artery. No significant stenosis, aneurysm or occlusion SMA: Patent without evidence of aneurysm, dissection, vasculitis or significant stenosis. Renals: Single right and single left renal arteries are patent. IMA: Patent without evidence of aneurysm, dissection, vasculitis or significant stenosis. Inflow: Patent without evidence of aneurysm, dissection, vasculitis or significant stenosis. Mild to moderate atherosclerosis Review of the MIP images confirms the above findings. NON-VASCULAR Hepatobiliary: Multiple hypodense liver lesions without change and likely due to cysts or small hemangioma. Status post cholecystectomy. No biliary dilatation Pancreas: Unremarkable. No pancreatic ductal dilatation or surrounding inflammatory changes. Spleen: Hypodense lesions at the splenic hilus without change since 2018 and felt benign. Adrenals/Urinary Tract: Right adrenal gland is normal. Stable fullness of left adrenal gland without dominant mass. Scarring upper pole right kidney. No hydronephrosis. Increased size of exophytic lesion off the mid pole left kidney now measuring 2.3 cm, previously 1.2 cm. The bladder shows a large stellate calcification in the right posterior lumen. Stomach/Bowel: The stomach is nonenlarged. No dilated small bowel. No acute bowel wall thickening. Negative appendix. Diverticular disease of the colon. Lymphatic: No suspicious lymph nodes. Reproductive: Prostate is unremarkable. Other: Negative for pelvic effusion or free air. Small fat containing umbilical hernia. Musculoskeletal: No acute or significant osseous findings. Review of the MIP images confirms the above findings. IMPRESSION: 1. Negative for acute aortic dissection. 2. Moderate large left pleural effusion  is increased compared to prior. Stable distortion and consolidation in the left upper lobe and perihilar region compared with multiple previous exams and presumably due to post therapy change. 3. Mild aneurysmal dilatation of the upper abdominal aorta up to 3.1 cm. Recommend follow-up ultrasound every 3 years. This recommendation follows ACR consensus guidelines: White Paper of the ACR Incidental Findings Committee II on Vascular Findings. J Am Coll Radiol 2013; 10:789-794. 4. No CT evidence for acute intra-abdominal or pelvic abnormality 5. Large stellate bladder calculus 6. Increased size slightly dense midpole lesion left kidney now measuring 2.3 cm. When the patient is clinically stable and able to follow directions and hold their breath (preferably as an outpatient)  further evaluation with dedicated abdominal MRI should be considered. Electronically Signed   By: Donavan Foil M.D.   On: 05/06/2022 19:22   DG Chest 2 View  Result Date: 05/06/2022 CLINICAL DATA:  Chest pain, shortness of breath. EXAM: CHEST - 2 VIEW COMPARISON:  Chest x-ray dated 10/03/2021. Chest CT dated 06/14/2021. Chest x-ray dated 01/28/2018. FINDINGS: Stable postoperative changes of the LEFT chest with chronic LEFT perihilar scarring. Again noted is chronic atelectasis and/or small pleural effusion at the LEFT lung base. RIGHT lung remains clear. Heart size and mediastinal contours are stable. Osseous structures about the chest are unremarkable. IMPRESSION: Stable chest x-ray. No acute findings. No evidence of pneumonia or pulmonary edema. Stable postoperative changes of the LEFT chest. Electronically Signed   By: Franki Cabot M.D.   On: 05/06/2022 13:35    EKG: I independently viewed the EKG done and my findings are as followed: Sinus bradycardia rate of 59.  Nonspecific ST-T changes.  QTc 461.  Assessment/Plan Present on Admission:  Dyspnea on exertion  Principal Problem:   Dyspnea on exertion  Dyspnea on exertion, likely  secondary to moderate to large left pleural effusion. IR consulted for possible left thoracentesis in the morning. Incentive spirometer Mobilize as tolerated Not hypoxic.  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 No evidence of acute ischemia on twelve-lead EKG At the time of his visit his symptoms have resolved. Monitor on telemetry  Intractable nausea without vomiting GERD Resume home PPI IV antiemetics as needed  Paroxysmal A-fib on Xarelto Currently rate controlled Hold off home Xarelto due to possible thoracentesis in the morning Defer to IR when to restart home Xarelto  Coronary artery disease status post CABG Resume home Lipitor Antiplatelet and anticoagulation on hold due to possible left thoracentesis in the morning  Seizure disorder Resume home Keppra Seizure precautions  COPD Resume home regimen  BPH Resume home Flomax Monitor urine output    DVT prophylaxis: Home Xarelto on hold due to possible thoracentesis in the morning.  Code Status: Full code.  Family Communication: None at bedside.  Disposition Plan: Admitted to telemetry unit  Consults called: Interventional radiology  Admission status: Observation status.   Status is: Observation    Kayleen Memos MD [facility] Pager [phone]  If 7PM-7AM, please contact night-coverage [email or web address] Password [email or web address]  05/07/2022, 3:25 AM

## 2022-05-07 NOTE — Progress Notes (Signed)
Lower extremity venous bilateral study completed.   Please see CV Proc for preliminary results.   Alaysiah Browder, RDMS, RVT  

## 2022-05-07 NOTE — Progress Notes (Signed)
Plan of Care Note for accepted transfer   Patient: Robert Bates MRN: [id number]   [place]: 05/06/2022  Facility requesting transfer: [facility] ED Requesting Provider: Dr. Laverta Baltimore Reason for transfer: Chest Pain, Shortness of breath, left pleural effusiono Facility course:   81 year old male with past medical history of Stage 2B lung cancer (S/P radiation), paroxysmal atrial fibrillation, COPD, peripheral vascular disease, hyperlipidemia, diastolic congestive heart failure, coronary artery disease status post CABG (05/2021) presenting to [place] emergency department with complaints of shortness of breath and right-sided chest pain.  Chest pain is somewhat atypical.  Troponins thus far have been unremarkable.  EKG reveals no ST segment changes.  CT angiogram of the chest obtained revealing moderate to large left-sided pleural effusion thought to be the cause of the patient's shortness of breath and chest pain.  Due to patient's ongoing symptoms ER providers requesting hospitalization for further evaluation of etiology of left-sided pleural effusion  (concern for underlying recurrence of lung cancer ) and management.   ER provider has already administered a dose of intravenous Lasix.  Patient may benefit from IR consultation in the morning for thoracentesis.  Plan of care: The patient is accepted for admission to Telemetry unit, at [name]..    Author: Vernelle Emerald, MD 05/07/2022  Check [email or web address] for on-call coverage.  Nursing staff, Please call [place] number on Amion as soon as patient's arrival, so appropriate admitting provider can evaluate the pt.

## 2022-05-07 NOTE — Progress Notes (Signed)
Pre Thoracentesis vital signs, vital signs stable. Patient alert and oriented x4

## 2022-05-07 NOTE — Consult Note (Signed)
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 He is afebrile and on room air. Xarelto has been held since admission but he did get ppx dose lovenox this morning.  He has had thoracentesis in the past but I can't find any lab results from them.   Pertinent  Medical History  DM2 AAA Seizure disorder Lung cancer CAD s/p CABG Chronic left pleural effusion  Significant Hospital Events: Including procedures, antibiotic start and stop dates in addition to other pertinent events   05/06/22 admitted   Interim History / Subjective:    Objective   Blood pressure 114/75, pulse 61, temperature 98.6 F (37 C), temperature source Oral, resp. rate 17, height 5\' 9"  (1.753 m), weight 80.8 kg, SpO2 96 %.        Intake/Output Summary (Last 24 hours) at 05/07/2022 1048 Last data filed at 05/07/2022 6222 Gross per 24 hour  Intake 540 ml  Output 1725 ml  Net -1185 ml   Filed Weights   05/06/22 1308 05/07/22 0150  Weight: 83.9 kg 80.8 kg    Examination: General appearance: 81 y.o., male, male, NAD, conversant  Eyes: anicteric sclerae; PERRL, tracking appropriately HENT: NCAT; MMM Neck: Trachea midline; no lymphadenopathy, no JVD Lungs: diminished on left faint inspriatory wheeze on right which clears with cough, with normal respiratory effort CV: RRR, no murmur  Abdomen: Soft, non-tender; non-distended, BS present   Extremities: No peripheral edema, warm Skin: Normal turgor and texture; no rash Psych: Appropriate affect Neuro: Alert and oriented to person and place, no focal deficit    Resolved Hospital Problem list    Assessment & Plan:   # Dyspnea, CP # Chronic left pleural effusion, larger than on prior films, unclear etiology # Stage IIb lung cancer s/p XRT in remission # COPD  - added on BNP to AM labs - s/p thora 1L removed before developed a little chest tightness, sent for usual studies - continue bronchodilators, resume breztri at discharge - will arrange outpatient follow up with Dr. Shearon Stalls or myself for thora results   Best Practice (right click and "Reselect all SmartList Selections" daily)   Per primary  Labs   CBC: Recent Labs  Lab 05/06/22 1323 05/07/22 0447  WBC 7.2 8.0  NEUTROABS  --  6.9  HGB 11.6* 12.7*  HCT 38.0* 41.3  MCV 89.0 87.7  PLT 227 979    Basic Metabolic Panel: Recent Labs  Lab 05/06/22 1323 05/07/22 0447  NA 141 141  K 4.3 4.1  CL 105 105  CO2 27 25  GLUCOSE 123* 176*  BUN 15 20  CREATININE 0.90 0.86  CALCIUM 8.7* 9.2  MG  --  2.0  PHOS  --  3.5   GFR: Estimated Creatinine Clearance: 68.5 mL/min (by C-G formula based on SCr of 0.86 mg/dL). Recent Labs  Lab 05/06/22 1323 05/07/22 0447  WBC 7.2 8.0    Liver Function Tests: Recent Labs  Lab 05/07/22 0447  AST  19  ALT 16  ALKPHOS 92  BILITOT 0.6  PROT 7.2  ALBUMIN 3.8   No results for input(s): "LIPASE", "AMYLASE" in the last 168 hours. No results for input(s): "AMMONIA" in the last 168 hours.  ABG    Component Value Date/Time   PHART 7.378 05/08/2021 2117   PCO2ART 39.5 05/08/2021 2117   PO2ART 87 05/08/2021 2117   HCO3 23.2 05/08/2021 2117   TCO2 24 05/08/2021 2117   ACIDBASEDEF 2.0 05/08/2021 2117   O2SAT 96.0 05/08/2021 2117     Coagulation Profile: No results for input(s): "INR", "PROTIME" in the last 168 hours.  Cardiac Enzymes: No results for  input(s): "CKTOTAL", "CKMB", "CKMBINDEX", "TROPONINI" in the last 168 hours.  HbA1C: Hgb A1c MFr Bld  Date/Time Value Ref Range Status  04/19/2022 11:25 AM 6.4 4.6 - 6.5 % Final    Comment:    Glycemic Control Guidelines for People with Diabetes:Non Diabetic:  <6%Goal of Therapy: <7%Additional Action Suggested:  >8%   01/15/2022 11:48 AM 6.5 4.6 - 6.5 % Final    Comment:    Glycemic Control Guidelines for People with Diabetes:Non Diabetic:  <6%Goal of Therapy: <7%Additional Action Suggested:  >8%     CBG: No results for input(s): "GLUCAP" in the last 168 hours.  Review of Systems:   12 point review of systems is negative except as in HPI  Past Medical History:  He,  has a past medical history of Anxiety, Arthritis, Cancer ([place]) (2016), COPD (chronic obstructive pulmonary disease) ([place]), Coronary artery disease, Diabetes type 2, controlled ([place]) (07/31/2017), Dyspnea, Dysrhythmia, GERD (gastroesophageal reflux disease), Hematuria, History of hiatal hernia, History of kidney stones, Hyperlipemia, Meningioma ([place]) (10/25/2013), Peripheral vascular disease ([place]), Pneumonia, Radiation (09/18/15-10/25/15), Seizures ([place]) (02/18/2020), and Tobacco abuse.   Surgical History:   Past Surgical History:  Procedure Laterality Date   CHOLECYSTECTOMY N/A 07/23/2017   Procedure: LAPAROSCOPIC CHOLECYSTECTOMY;  Surgeon: Kinsinger, Arta Bruce, MD;  Location: [facility];  Service: General;  Laterality: N/A;   CLIPPING OF ATRIAL APPENDAGE Left 05/08/2021   Procedure: CLIPPING OF ATRIAL APPENDAGE USING 48 ATRICLIP;  Surgeon: Wonda Olds, MD;  Location: [place];  Service: Open Heart Surgery;  Laterality: Left;   COLONOSCOPY     CORONARY ARTERY BYPASS GRAFT N/A 05/08/2021   Procedure: CORONARY ARTERY BYPASS GRAFTING (CABG)X 3 USING LEFT INTERNAL MAMMARY ARTERY AND RIGHT GREATER SAPEHNOUS VEIN;  Surgeon: Wonda Olds, MD;  Location: [place];  Service: Open Heart Surgery;  Laterality: N/A;   ENDOVEIN HARVEST OF  GREATER SAPHENOUS VEIN Right 05/08/2021   Procedure: ENDOVEIN HARVEST OF GREATER SAPHENOUS VEIN;  Surgeon: Wonda Olds, MD;  Location: [place];  Service: Open Heart Surgery;  Laterality: Right;   EYE SURGERY Bilateral    Cataracts removed w/ lens implant   HERNIA REPAIR     Left 36 years ago . Right inguinal hernia repair 10-01-17 Dr. Kieth Brightly   INGUINAL HERNIA REPAIR Right 10/01/2017   Procedure: RIGHT INGUINAL HERNIA REPAIR WITH MESH;  Surgeon: Kinsinger, Arta Bruce, MD;  Location: [facility];  Service: General;  Laterality: Right;  TAP BLOCK   INSERTION OF MESH Right 10/01/2017   Procedure: INSERTION OF MESH;  Surgeon: Kieth Brightly Arta Bruce, MD;  Location: [facility];  Service: General;  Laterality: Right;   IR THORACENTESIS ASP PLEURAL SPACE W/IMG GUIDE  05/18/2021   IR THORACENTESIS ASP PLEURAL SPACE W/IMG GUIDE  06/07/2021   LEFT HEART CATH AND CORONARY ANGIOGRAPHY N/A 04/20/2021   Procedure: LEFT HEART CATH AND  CORONARY ANGIOGRAPHY;  Surgeon: Wellington Hampshire, MD;  Location: [place] [facility];  Service: Cardiovascular;  Laterality: N/A;   TEE WITHOUT CARDIOVERSION N/A 05/08/2021   Procedure: TRANSESOPHAGEAL ECHOCARDIOGRAM (TEE);  Surgeon: Wonda Olds, MD;  Location: [place];  Service: Open Heart Surgery;  Laterality: N/A;   TONSILLECTOMY     TONSILLECTOMY     VIDEO BRONCHOSCOPY Bilateral 07/26/2015   Procedure: VIDEO BRONCHOSCOPY WITH FLUORO;  Surgeon: [name], MD;  [facility];  Service: Cardiopulmonary;  Laterality: Bilateral;   VIDEO BRONCHOSCOPY WITH ENDOBRONCHIAL NAVIGATION N/A 08/23/2015   Procedure: VIDEO BRONCHOSCOPY WITH ENDOBRONCHIAL NAVIGATION;  Surgeon: Grace Isaac, MD;  Location: [place];  Service: Thoracic;  Laterality: N/A;   VIDEO BRONCHOSCOPY WITH ENDOBRONCHIAL ULTRASOUND N/A 08/23/2015   Procedure: VIDEO BRONCHOSCOPY WITH ENDOBRONCHIAL ULTRASOUND;  Surgeon: Grace Isaac, MD;  Location: [place];  Service: Thoracic;  Laterality: N/A;     Social  History:   reports that he quit smoking about 6 years ago. His smoking use included cigarettes and cigars. He has a 57.00 pack-year smoking history. He quit smokeless tobacco use about 63 years ago.  His smokeless tobacco use included chew. He reports that he does not currently use alcohol. He reports that he does not use drugs.   Family History:  His family history includes Atrial fibrillation in his son; Emphysema in his father; Learning disabilities in his son; Leukemia in his father and another family member; Stroke in an other family member.   Allergies Allergies  Allergen Reactions   Iodine Other (See Comments)    neck swells   Iohexol Swelling    Neck and gland swelling per patient.     Home Medications  Prior to Admission medications   Medication Sig Start Date End Date Taking? Authorizing Provider  acetaminophen (TYLENOL) 500 MG tablet Take 1-2 tablets (500-1,000 mg total) by mouth every 6 (six) hours as needed. Patient taking differently: Take 500-1,000 mg by mouth every 6 (six) hours as needed for mild pain or moderate pain. 05/16/21  Yes Lars Pinks M, PA-C  albuterol (PROVENTIL) (2.5 MG/3ML) 0.083% nebulizer solution Take 3 mLs (2.5 mg total) by nebulization every 6 (six) hours as needed for wheezing or shortness of breath. 10/17/21  Yes Spero Geralds, MD  albuterol (VENTOLIN HFA) 108 (90 Base) MCG/ACT inhaler Inhale 2 puffs into the lungs every 6 (six) hours as needed for wheezing or shortness of breath. 07/27/21  Yes [name], NP  amiodarone (PACERONE) 200 MG tablet Take 1/2 tablet (100 mg total) by mouth daily. 04/05/22  Yes Weaver, Scott T, PA-C  atorvastatin (LIPITOR) 80 MG tablet Take 1 tablet (80 mg total) by mouth daily. 11/09/21  Yes Nahser, Wonda Cheng, MD  Budeson-Glycopyrrol-Formoterol (BREZTRI AEROSPHERE) 160-9-4.8 MCG/ACT AERO Inhale 2 puffs into the lungs in the morning and at bedtime. 10/17/21  Yes Spero Geralds, MD  furosemide (LASIX) 20 MG tablet  TAKE 2 TABLETS BY MOUTH DAILY FOR 2 DAYS THEN REDUCE TO 1 TABLET BY MOUTH DAILY Patient taking differently: Take 20 mg by mouth daily. 04/05/22  Yes Weaver, Scott T, PA-C  gabapentin (NEURONTIN) 300 MG capsule Take 1 capsule (300 mg total) by mouth 3 (three) times daily. 01/15/22  Yes [name], NP  Levetiracetam 750 MG TB24 Take 1 tablet (750 mg total) by mouth at bedtime. 04/12/22  Yes Alric Ran, MD  Multiple Vitamin (MULTIVITAMIN WITH MINERALS) TABS tablet Take 1 tablet by mouth daily in the afternoon.  Yes [provider]  omeprazole (PRILOSEC) 40 MG capsule Take 1 capsule (40 mg total) by mouth daily. 10/12/21  Yes [name], NP  potassium chloride (KLOR-CON) 10 MEQ tablet TAKE 2 TABLETS BY MOUTH DAILY FOR 2 DAYS THEN REDUCE TO 1 TABLET BY MOUTH DAILY Patient taking differently: Take 10 mEq by mouth once. 04/05/22  Yes Weaver, Scott T, PA-C  rivaroxaban (XARELTO) 20 MG TABS tablet TAKE 1 TABLET BY MOUTH DAILY WITH SUPPER Patient taking differently: Take 20 mg by mouth daily with supper. 11/20/21  Yes Nahser, Wonda Cheng, MD  tamsulosin (FLOMAX) 0.4 MG CAPS capsule TAKE 1 CAPSULE (0.4 MG TOTAL) BY MOUTH DAILY. Patient taking differently: Take 0.4 mg by mouth daily. 09/14/21 09/14/22 Yes [name], NP  metFORMIN (GLUCOPHAGE) 500 MG tablet Take 1 tablet (500 mg total) by mouth 2 (two) times daily with a meal. 04/26/21 05/08/21  Margie Billet, NP     Critical care time: n/a

## 2022-05-08 ENCOUNTER — Telehealth: Payer: Self-pay | Admitting: Family

## 2022-05-08 ENCOUNTER — Telehealth: Payer: Medicare Other

## 2022-05-08 ENCOUNTER — Other Ambulatory Visit: Payer: Self-pay | Admitting: Family

## 2022-05-08 ENCOUNTER — Other Ambulatory Visit (HOSPITAL_BASED_OUTPATIENT_CLINIC_OR_DEPARTMENT_OTHER): Payer: Self-pay

## 2022-05-08 DIAGNOSIS — E1142 Type 2 diabetes mellitus with diabetic polyneuropathy: Secondary | ICD-10-CM

## 2022-05-08 DIAGNOSIS — R0609 Other forms of dyspnea: Secondary | ICD-10-CM | POA: Diagnosis not present

## 2022-05-08 DIAGNOSIS — N289 Disorder of kidney and ureter, unspecified: Secondary | ICD-10-CM

## 2022-05-08 DIAGNOSIS — N2889 Other specified disorders of kidney and ureter: Secondary | ICD-10-CM

## 2022-05-08 MED ORDER — FUROSEMIDE 40 MG PO TABS
40.0000 mg | ORAL_TABLET | Freq: Every day | ORAL | 1 refills | Status: DC
  Filled 2022-05-08: qty 30, 30d supply, fill #0
  Filled 2022-06-03: qty 30, 30d supply, fill #1

## 2022-05-08 MED ORDER — OMEPRAZOLE 40 MG PO CPDR
40.0000 mg | DELAYED_RELEASE_CAPSULE | Freq: Every day | ORAL | 1 refills | Status: DC
Start: 2022-05-08 — End: 2022-11-05
  Filled 2022-05-08: qty 90, 90d supply, fill #0
  Filled 2022-08-12: qty 90, 90d supply, fill #1

## 2022-05-08 NOTE — Telephone Encounter (Signed)
Please contact pt to schedule a 1 week hospital follow up with me. Also, let him know that I am placing an order for an MRI of his kidney to look further at a spot on his kidney.

## 2022-05-08 NOTE — Telephone Encounter (Signed)
Pt scheduled with [facility] for OV to review thora results on 7/13.

## 2022-05-08 NOTE — Telephone Encounter (Signed)
Pt stated they need a refill on his seizure medication due to the staff not giving it back to him after his recent hospital stay. Pt is not aware of the name of the medication and unable to locate it in his med list.

## 2022-05-08 NOTE — TOC Progression Note (Signed)
Transition of Care [facility]) - Progression Note    Patient Details  Name: Robert Bates MRN: [id number] Date of Birth: [birth year]  Transition of [facility]) CM/SW Contact  Joaquin Courts, RN Phone Number: 05/08/2022, 9:26 AM       Transition of Care Department [facility]) has reviewed patient and no TOC needs have been identified at this time. We will continue to monitor patient advancement through interdisciplinary progression rounds. If new patient transition needs arise, please place a TOC consult.

## 2022-05-08 NOTE — Telephone Encounter (Signed)
Called a few times but no answer, lvm for patient to call back

## 2022-05-08 NOTE — Discharge Summary (Signed)
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 Should be set up also for abdominal aorta ultrasound every 3 years given aneurysmal dilatation of aorta  Discharge Diagnoses:  MAIN problem for hospitalization   Pleural effusion transudative Hypoxic respiratory failure on admission  Please see below for itemized issues addressed in [facility]- refer to other progress notes for clarity if needed  Discharge Condition: Improved  Diet recommendation: Heart healthy fluid restricted 1800 cc  Filed Weights   05/06/22 1308 05/07/22 0150  Weight: 83.9 kg 80.8 kg    History of present illness:  81 year old male CAD + CABG COPD 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 and Xarelto Stage IIb non-SSL C status postradiation 2016 seizure disorder and prior pleural effusion in the setting of CABG found to have large left-sided pleural effusion'  Patient was admitted to the hospital and found to have a transudative effusion This was determined by thoracentesis that was performed yielding 1 L of fluid Patient admitted to noncompliance on Lasix and not taking it "I only take it when I need it and feels short of breath or swollen" I reiterated to him the need for Lasix 40 daily scheduled  Paroxysmal A-fib--amiodarone 100 was resumed as well Xarelto after thoracentesis Seizure disorder Keppra was resumed Lung cancer-patient seen by Dr. 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--needs outpatient dedicated MRI  His other medical issues were relatively stable   Procedures: 1 L fluid drained from thoracentesis 7/4  Consultations: Pulmonology  Discharge Exam: Vitals:   05/07/22 2051 05/08/22 0500  BP: 102/84 123/89  Pulse: 68 88  Resp: 19 18  Temp: 98.3 F (36.8 C) 97.7 F (36.5 C)  SpO2: 98% 96%    Subj on day of d/c   Awake coherent alert no distress No chest pain no fever no chills no nausea no vomiting ROM intact CTA B no added sound rales rhonchi wheeze S1-S2 no murmur no rub no gallop Neurologically intact no focal deficit No lower extremity edema   Discharge Instructions   Discharge Instructions     Diet - low sodium heart healthy   Complete by: As directed    Discharge instructions   Complete by: As directed    Make sure that you take your Lasix every day the dose of 40 mg-the fluid in your lungs is probably because you build up fluid as you are not taking it regularly You will need labs in about 1 week as we discussed to determine what your kidneys are doing and you will need labs every so often Please follow-up with your regular doctor in about 1 to 1-1/2 weeks and with the lung doctor in about 4 weeks and they will call you to check on your lungs and fluid issues Do not drink excessive amounts of water and limit yourself to 1800 cc and this will be explained to you prior to discharge  Increase activity slowly   Complete by: As directed       Allergies as of 05/08/2022       Reactions   Iodine Other (See Comments)   neck swells   Iohexol Swelling   Neck and gland swelling per patient.        Medication List     TAKE these medications    acetaminophen 500 MG tablet Commonly known as: TYLENOL Take 1-2 tablets (500-1,000 mg total) by mouth every 6  (six) hours as needed. What changed: reasons to take this   albuterol 108 (90 Base) MCG/ACT inhaler Commonly known as: VENTOLIN HFA Inhale 2 puffs into the lungs every 6 (six) hours as needed for wheezing or shortness of breath.   albuterol (2.5 MG/3ML) 0.083% nebulizer solution Commonly known as: PROVENTIL Take 3 mLs (2.5 mg total) by nebulization every 6 (six) hours as needed for wheezing or shortness of breath.   amiodarone 200 MG tablet Commonly known as: PACERONE Take 1/2 tablet (100 mg total) by mouth daily.   atorvastatin 80 MG tablet Commonly known as: LIPITOR Take 1 tablet (80 mg total) by mouth daily.   Breztri Aerosphere 160-9-4.8 MCG/ACT Aero Generic drug: Budeson-Glycopyrrol-Formoterol Inhale 2 puffs into the lungs in the morning and at bedtime.   furosemide 40 MG tablet Commonly known as: LASIX Take 1 tablet (40 mg total) by mouth daily. Start taking on: May 09, 2022 What changed:  medication strength how much to take how to take this when to take this additional instructions   gabapentin 300 MG capsule Commonly known as: NEURONTIN Take 1 capsule (300 mg total) by mouth 3 (three) times daily.   Levetiracetam 750 MG Tb24 Take 1 tablet (750 mg total) by mouth at bedtime.   multivitamin with minerals Tabs tablet Take 1 tablet by mouth daily in the afternoon.   omeprazole 40 MG capsule Commonly known as: PRILOSEC Take 1 capsule (40 mg total) by mouth daily.   potassium chloride 10 MEQ tablet Commonly known as: KLOR-CON TAKE 2 TABLETS BY MOUTH DAILY FOR 2 DAYS THEN REDUCE TO 1 TABLET BY MOUTH DAILY What changed:  how much to take how to take this when to take this additional instructions   tamsulosin 0.4 MG Caps capsule Commonly known as: FLOMAX TAKE 1 CAPSULE (0.4 MG TOTAL) BY MOUTH DAILY. What changed: how much to take   Xarelto 20 MG Tabs tablet Generic drug: rivaroxaban TAKE 1 TABLET BY MOUTH DAILY WITH SUPPER What changed:  how much to  take how to take this when to take this       Allergies  Allergen Reactions   Iodine Other (See Comments)    neck swells   Iohexol Swelling    Neck and gland swelling per patient.      The results of significant diagnostics from this hospitalization (including imaging, microbiology, ancillary and laboratory) are listed below for reference.    Significant Diagnostic Studies: VAS [place] LOWER EXTREMITY VENOUS (DVT)  Result Date: 05/08/2022  Lower Venous DVT Study Patient Name:  JAKIN PAVAO  Date of Exam:   05/07/2022 Medical Rec #: [id number]      Accession #:    [id number] Date of Birth: 1941-09-18      Patient Gender: M Patient Age:   89 years Exam Location:  [facility] Procedure:      VAS [place] LOWER EXTREMITY VENOUS (DVT) Referring Phys: Oren Binet --------------------------------------------------------------------------------  Indications: Edema, varicosities.  Comparison  04-02-2020 Prior left lower extremity venous was negative for Study:           DVT. Performing Technologist: Darlin Coco RDMS, RVT  Examination Guidelines: A complete evaluation includes B-mode imaging, spectral Doppler, color Doppler, and power Doppler as needed of all accessible portions of each vessel. Bilateral testing is considered an integral part of a complete examination. Limited examinations for reoccurring indications may be performed as noted. The reflux portion of the exam is performed with the patient in reverse Trendelenburg.  +---------+---------------+---------+-----------+----------+--------------+ RIGHT    CompressibilityPhasicitySpontaneityPropertiesThrombus Aging +---------+---------------+---------+-----------+----------+--------------+ CFV      Full           Yes      Yes                                 +---------+---------------+---------+-----------+----------+--------------+ SFJ      Full                                                         +---------+---------------+---------+-----------+----------+--------------+ FV Prox  Full                                                        +---------+---------------+---------+-----------+----------+--------------+ FV Mid   Full                                                        +---------+---------------+---------+-----------+----------+--------------+ FV DistalFull                                                        +---------+---------------+---------+-----------+----------+--------------+ PFV      Full                                                        +---------+---------------+---------+-----------+----------+--------------+ POP      Full           Yes      Yes                                 +---------+---------------+---------+-----------+----------+--------------+ PTV      Full                                                        +---------+---------------+---------+-----------+----------+--------------+ PERO     Full                                                        +---------+---------------+---------+-----------+----------+--------------+   +---------+---------------+---------+-----------+----------+--------------+  LEFT     CompressibilityPhasicitySpontaneityPropertiesThrombus Aging +---------+---------------+---------+-----------+----------+--------------+ CFV      Full           Yes      Yes                                 +---------+---------------+---------+-----------+----------+--------------+ SFJ      Full                                                        +---------+---------------+---------+-----------+----------+--------------+ FV Prox  Full                                                        +---------+---------------+---------+-----------+----------+--------------+ FV Mid   Full                                                         +---------+---------------+---------+-----------+----------+--------------+ FV DistalFull                                                        +---------+---------------+---------+-----------+----------+--------------+ PFV      Full                                                        +---------+---------------+---------+-----------+----------+--------------+ POP      Full           Yes      Yes                                 +---------+---------------+---------+-----------+----------+--------------+ PTV      Full                                                        +---------+---------------+---------+-----------+----------+--------------+ PERO     Full                                                        +---------+---------------+---------+-----------+----------+--------------+     Summary: RIGHT: - There is no evidence of deep vein thrombosis in the lower extremity.  - No cystic structure found in the popliteal fossa.  LEFT: - There is no evidence of deep vein thrombosis in the lower extremity.  - No  cystic structure found in the popliteal fossa.  *See table(s) above for measurements and observations. Electronically signed by Jamelle Haring on 05/08/2022 at 2:07:17 AM.    Final    DG CHEST PORT 1 VIEW  Result Date: 05/07/2022 CLINICAL DATA:  Status post LEFT thoracentesis. EXAM: PORTABLE CHEST 1 VIEW COMPARISON:  05/06/2022 and prior studies FINDINGS: There has been interval decrease in LEFT pleural effusion, now appear small. There is no evidence of pneumothorax. LEFT lung postoperative changes/atelectasis again noted. Cardiomediastinal silhouette is unchanged otherwise with median sternotomy and LEFT atrial clip again noted. IMPRESSION: Decreased LEFT pleural effusion after thoracentesis. No pneumothorax. Electronically Signed   By: Margarette Canada M.D.   On: 05/07/2022 14:22   CT Angio Chest/Abd/Pel for Dissection W and/or Wo Contrast  Result Date:  05/06/2022 CLINICAL DATA:  Chest and back pain EXAM: CT ANGIOGRAPHY CHEST, ABDOMEN AND PELVIS TECHNIQUE: Non-contrast CT of the chest was initially obtained. Multidetector CT imaging through the chest, abdomen and pelvis was performed using the standard protocol during bolus administration of intravenous contrast. Multiplanar reconstructed images and MIPs were obtained and reviewed to evaluate the vascular anatomy. RADIATION DOSE REDUCTION: This exam was performed according to the departmental dose-optimization program which includes automated exposure control, adjustment of the mA and/or kV according to patient size and/or use of iterative reconstruction technique. CONTRAST:  132mL OMNIPAQUE IOHEXOL 350 MG/ML SOLN COMPARISON:  Chest x-ray 05/06/2022, CT chest 06/14/2021, CT abdomen pelvis 07/12/2017, CT chest 04/20/2021, 01/02/2018 FINDINGS: CTA CHEST FINDINGS Cardiovascular: Non contrasted images of the chest demonstrate no acute intramural hematoma. Moderate aortic atherosclerosis. Maximum ascending aortic diameter of 3.9 cm. Post CABG changes. No dissection is seen. No filling defects within the central pulmonary arteries to suggest an embolus. Coronary vascular calcification. Atrial appendage clip. Normal cardiac size. No pericardial effusion Mediastinum/Nodes: Midline trachea. No thyroid mass. No suspicious lymph nodes. Moderate hiatal hernia. Lungs/Pleura: Moderate to large left pleural effusion. This is increased. Similar distortion and consolidation within the left upper lobe and superior segment left lower lobe consistent with post therapy changes. Musculoskeletal: Post sternotomy changes. Stable sclerotic lesion in T8, likely a bone island. No acute osseous abnormality. Review of the MIP images confirms the above findings. CTA ABDOMEN AND PELVIS FINDINGS VASCULAR Aorta: Moderate aortic atherosclerosis. Mild aneurysmal dilatation of the upper abdominal aorta at the level of renal arteries measuring up to  3.1 cm. No dissection or occlusive disease. Celiac: Common origin with the superior mesenteric artery. No significant stenosis, aneurysm or occlusion SMA: Patent without evidence of aneurysm, dissection, vasculitis or significant stenosis. Renals: Single right and single left renal arteries are patent. IMA: Patent without evidence of aneurysm, dissection, vasculitis or significant stenosis. Inflow: Patent without evidence of aneurysm, dissection, vasculitis or significant stenosis. Mild to moderate atherosclerosis Review of the MIP images confirms the above findings. NON-VASCULAR Hepatobiliary: Multiple hypodense liver lesions without change and likely due to cysts or small hemangioma. Status post cholecystectomy. No biliary dilatation Pancreas: Unremarkable. No pancreatic ductal dilatation or surrounding inflammatory changes. Spleen: Hypodense lesions at the splenic hilus without change since 2018 and felt benign. Adrenals/Urinary Tract: Right adrenal gland is normal. Stable fullness of left adrenal gland without dominant mass. Scarring upper pole right kidney. No hydronephrosis. Increased size of exophytic lesion off the mid pole left kidney now measuring 2.3 cm, previously 1.2 cm. The bladder shows a large stellate calcification in the right posterior lumen. Stomach/Bowel: The stomach is nonenlarged. No dilated small bowel. No acute bowel wall thickening. Negative appendix. Diverticular  disease of the colon. Lymphatic: No suspicious lymph nodes. Reproductive: Prostate is unremarkable. Other: Negative for pelvic effusion or free air. Small fat containing umbilical hernia. Musculoskeletal: No acute or significant osseous findings. Review of the MIP images confirms the above findings. IMPRESSION: 1. Negative for acute aortic dissection. 2. Moderate large left pleural effusion is increased compared to prior. Stable distortion and consolidation in the left upper lobe and perihilar region compared with multiple  previous exams and presumably due to post therapy change. 3. Mild aneurysmal dilatation of the upper abdominal aorta up to 3.1 cm. Recommend follow-up ultrasound every 3 years. This recommendation follows ACR consensus guidelines: White Paper of the ACR Incidental Findings Committee II on Vascular Findings. J Am Coll Radiol 2013; 10:789-794. 4. No CT evidence for acute intra-abdominal or pelvic abnormality 5. Large stellate bladder calculus 6. Increased size slightly dense midpole lesion left kidney now measuring 2.3 cm. When the patient is clinically stable and able to follow directions and hold their breath (preferably as an outpatient) further evaluation with dedicated abdominal MRI should be considered. Electronically Signed   By: Donavan Foil M.D.   On: 05/06/2022 19:22   DG Chest 2 View  Result Date: 05/06/2022 CLINICAL DATA:  Chest pain, shortness of breath. EXAM: CHEST - 2 VIEW COMPARISON:  Chest x-ray dated 10/03/2021. Chest CT dated 06/14/2021. Chest x-ray dated 01/28/2018. FINDINGS: Stable postoperative changes of the LEFT chest with chronic LEFT perihilar scarring. Again noted is chronic atelectasis and/or small pleural effusion at the LEFT lung base. RIGHT lung remains clear. Heart size and mediastinal contours are stable. Osseous structures about the chest are unremarkable. IMPRESSION: Stable chest x-ray. No acute findings. No evidence of pneumonia or pulmonary edema. Stable postoperative changes of the LEFT chest. Electronically Signed   By: Franki Cabot M.D.   On: 05/06/2022 13:35    Microbiology: Recent Results (from the past 240 hour(s))  Body fluid culture w Gram Stain     Status: None (Preliminary result)   Collection Time: 05/07/22  1:59 PM   Specimen: Pleura; Body Fluid  Result Value Ref Range Status   Specimen Description   Final    PLEURAL LEFT Performed at [facility] [address] [place], [place] [address]    Special Requests   Final     NONE Performed at [facility], [place] [address], [place], [place] [address]    Gram Stain NO WBC SEEN NO ORGANISMS SEEN   Final   Culture   Final    NO GROWTH < 24 HOURS Performed at [facility], [place] [address], [place], [place] [address]    Report Status PENDING  Incomplete     Labs: Basic Metabolic Panel: Recent Labs  Lab 05/06/22 1323 05/07/22 0447  NA 141 141  K 4.3 4.1  CL 105 105  CO2 27 25  GLUCOSE 123* 176*  BUN 15 20  CREATININE 0.90 0.86  CALCIUM 8.7* 9.2  MG  --  2.0  PHOS  --  3.5   Liver Function Tests: Recent Labs  Lab 05/07/22 0447  AST 19  ALT 16  ALKPHOS 92  BILITOT 0.6  PROT 7.2  ALBUMIN 3.8   No results for input(s): "LIPASE", "AMYLASE" in the last 168 hours. No results for input(s): "AMMONIA" in the last 168 hours. CBC: Recent Labs  Lab 05/06/22 1323 05/07/22 0447  WBC 7.2 8.0  NEUTROABS  --  6.9  HGB 11.6* 12.7*  HCT 38.0* 41.3  MCV 89.0 87.7  PLT 227 282   Cardiac Enzymes: No results for input(s): "CKTOTAL", "CKMB", "CKMBINDEX", "TROPONINI" in the last 168 hours. BNP: BNP (last 3 results) Recent Labs    05/07/22 0447  BNP 203.6*    ProBNP (last 3 results) No results for input(s): "PROBNP" in the last 8760 hours.  CBG: No results for input(s): "GLUCAP" in the last 168 hours.     Signed:  Nita Sells MD   [facility] 05/08/2022, 9:32 AM

## 2022-05-08 NOTE — Progress Notes (Signed)
Patient to be discharged to home today. Patient given discharge instructions/teaching including all Discharge Medications and schedules for these Medications. Patient verbalized understanding of all discharge teaching. Discharge AVS with patient at time of discharge

## 2022-05-09 ENCOUNTER — Telehealth (HOSPITAL_BASED_OUTPATIENT_CLINIC_OR_DEPARTMENT_OTHER): Payer: Self-pay

## 2022-05-09 ENCOUNTER — Other Ambulatory Visit: Payer: Self-pay | Admitting: Family

## 2022-05-09 ENCOUNTER — Other Ambulatory Visit (HOSPITAL_BASED_OUTPATIENT_CLINIC_OR_DEPARTMENT_OTHER): Payer: Self-pay

## 2022-05-09 DIAGNOSIS — E1142 Type 2 diabetes mellitus with diabetic polyneuropathy: Secondary | ICD-10-CM

## 2022-05-09 MED ORDER — TAMSULOSIN HCL 0.4 MG PO CAPS
ORAL_CAPSULE | Freq: Every day | ORAL | 1 refills | Status: DC
  Filled 2022-05-09: qty 90, 90d supply, fill #0
  Filled 2022-08-12: qty 90, 90d supply, fill #1

## 2022-05-09 MED ORDER — GABAPENTIN 300 MG PO CAPS
300.0000 mg | ORAL_CAPSULE | Freq: Three times a day (TID) | ORAL | 1 refills | Status: DC
  Filled 2022-05-09: qty 90, 30d supply, fill #0
  Filled 2022-07-18: qty 90, 30d supply, fill #1

## 2022-05-09 NOTE — Telephone Encounter (Signed)
Lvm patient to call for appointment

## 2022-05-09 NOTE — Telephone Encounter (Signed)
Prescriptions sent

## 2022-05-10 LAB — CYTOLOGY - NON PAP

## 2022-05-10 NOTE — Telephone Encounter (Signed)
Patient scheduled to see [facility] 09-15-22

## 2022-05-11 ENCOUNTER — Ambulatory Visit (HOSPITAL_BASED_OUTPATIENT_CLINIC_OR_DEPARTMENT_OTHER)
Admission: RE | Admit: 2022-05-11 | Discharge: 2022-05-11 | Disposition: A | Discharge status: Home / Self Care | Payer: Medicare Other | Source: Ambulatory Visit | Attending: Family | Admitting: Family

## 2022-05-11 DIAGNOSIS — N289 Disorder of kidney and ureter, unspecified: Secondary | ICD-10-CM

## 2022-05-11 DIAGNOSIS — N2889 Other specified disorders of kidney and ureter: Secondary | ICD-10-CM

## 2022-05-11 DIAGNOSIS — K7689 Other specified diseases of liver: Secondary | ICD-10-CM | POA: Diagnosis not present

## 2022-05-11 DIAGNOSIS — K449 Diaphragmatic hernia without obstruction or gangrene: Secondary | ICD-10-CM | POA: Diagnosis not present

## 2022-05-11 DIAGNOSIS — K5730 Diverticulosis of large intestine without perforation or abscess without bleeding: Secondary | ICD-10-CM | POA: Diagnosis not present

## 2022-05-11 DIAGNOSIS — D734 Cyst of spleen: Secondary | ICD-10-CM | POA: Diagnosis not present

## 2022-05-11 LAB — BODY FLUID CULTURE W GRAM STAIN
Culture: NO GROWTH
Gram Stain: NONE SEEN

## 2022-05-11 MED ORDER — GADOBUTROL 1 MMOL/ML IV SOLN
8.0000 mL | Freq: Once | INTRAVENOUS | Status: AC | PRN
  Administered 2022-05-11: 8 mL via INTRAVENOUS

## 2022-05-13 ENCOUNTER — Encounter: Payer: Self-pay | Admitting: Family

## 2022-05-13 ENCOUNTER — Other Ambulatory Visit (HOSPITAL_BASED_OUTPATIENT_CLINIC_OR_DEPARTMENT_OTHER): Payer: Self-pay

## 2022-05-13 ENCOUNTER — Ambulatory Visit (INDEPENDENT_AMBULATORY_CARE_PROVIDER_SITE_OTHER): Payer: Medicare Other | Admitting: Pharmacist

## 2022-05-13 ENCOUNTER — Telehealth: Payer: Self-pay | Admitting: Student

## 2022-05-13 DIAGNOSIS — E782 Mixed hyperlipidemia: Secondary | ICD-10-CM

## 2022-05-13 DIAGNOSIS — R569 Unspecified convulsions: Secondary | ICD-10-CM

## 2022-05-13 DIAGNOSIS — I77819 Aortic ectasia, unspecified site: Secondary | ICD-10-CM

## 2022-05-13 DIAGNOSIS — I10 Essential (primary) hypertension: Secondary | ICD-10-CM

## 2022-05-13 DIAGNOSIS — E1142 Type 2 diabetes mellitus with diabetic polyneuropathy: Secondary | ICD-10-CM

## 2022-05-13 DIAGNOSIS — I5032 Chronic diastolic (congestive) heart failure: Secondary | ICD-10-CM

## 2022-05-13 DIAGNOSIS — J449 Chronic obstructive pulmonary disease, unspecified: Secondary | ICD-10-CM

## 2022-05-13 DIAGNOSIS — I25110 Atherosclerotic heart disease of native coronary artery with unstable angina pectoris: Secondary | ICD-10-CM

## 2022-05-13 DIAGNOSIS — I4819 Other persistent atrial fibrillation: Secondary | ICD-10-CM

## 2022-05-13 DIAGNOSIS — J910 Malignant pleural effusion: Secondary | ICD-10-CM

## 2022-05-13 HISTORY — DX: Aortic ectasia, unspecified site: I77.819

## 2022-05-13 NOTE — Chronic Care Management (AMB) (Signed)
Chronic Care Management Pharmacy Note  05/13/2022 Name:  Robert Bates MRN:  [id number] DOB:  [birth year]  Summary: Reviewed adherence  Recommended restart checking weight each morning - report weight gain of more than 3 lbs in 24 hours or 5 lbs in 1 week. Contact office if you have worsening abdominal fullness or any of these other symptoms. Weight gain, shortness of breath, abdominal fullness, swelling in legs or abdomen, Fatigue and weakness, changes in ability to perform usual activities, persistent cough or wheezing with white or pink blood-tinged mucus, nausea and lack of appetite Discussed importance of taking furosemide EVERY day.  Discussed different between Breztri (maintenance inhaler) and albuterol inhaler / nebulization (rescue therapy). Coordinated refill for albuterol with pharmacy.  Patient states he was having difficulty sleeping and pharmacy has recommended melatonin. Reports he was taking 4 tablets at bedtime but not helpful. Had patient check and he has melatonin 3mg  tablets - explained that max dose per day is 10mg  and that he should take no more than 3 tablets = 9mg  at night. Since is not helping anyway suggested he discuss sleep further with PCP at appointment 05/15/2022   Subjective: Robert Bates is an 81 y.o. year old male who is a primary patient of [name], NP.  The CCM team was consulted for assistance with disease management and care coordination needs.    Engaged with patient by telephone for  follow up  in response to provider referral for pharmacy case management and/or care coordination services.   Consent to Services:  The patient was given information about Chronic Care Management services, agreed to services, and gave verbal consent prior to initiation of services.  Please see initial visit note for detailed documentation.   Patient Care Team: [name], NP as PCP - General Nahser, Wonda Cheng, MD as PCP - Cardiology  (Cardiology) Elsie Stain, MD as Attending Physician (Pulmonary Disease) Virgina Evener, OD (Optometry) Cherre Robins, RPH-CPP (Pharmacist) Sharmon Revere as Physician Assistant (Cardiology) Spero Geralds, MD as Consulting Physician (Pulmonary Disease)  Recent office visits: 04/19/2022 - Int Med Inda Castle, NP) F/U chronic conditions. Continues to have neck and back pain. He is also concerned with increase in weight. No med changes notes.  03/15/2022 - Int Med Inda Castle, NP) Seen for increased urinary frequency.  Referred to neurology for f/u seizures. Noted to have microscopic hematuria - referred back to urology.  01/15/2022 - Int Med Inda Castle) Seen for right groin pain, neuropathy and left eye vision changes. Referred to general surgeon for consultation for inguinal hernia. Started gabapentin 300mg  once daily for neuropathy 10/12/2021 - Int Med Inda Castle NP) F/U COPD exacerbation. Referred to pulmonology and cardiology  Recent consult visits: 04/18/2022 - Pulm (Dr Shearon Stalls) F/U COPD. No med changes noted. 04/12/2022 - Neurology (Dr April Manson) Seen for seizures. Restarted Keppra XR 750mg  daily. F/U 5 months.  04/05/2022 - Cardio Kathlen Mody, [facility]) F/U CHF and afib. Noted volume excess. Increased furosemide to 40mg  daily for 2 days (also take 58mEq for 3 days) (if edema continues could consider SGLT2) Lowered dose of amiodarone to 100mg  daily  01/11/2022 - Pulmonology (Dr Shearon Stalls) Prescribed prednisone 40mg  daily for 5 days.    Hospital visits: None in last 6 months   Objective:  Lab Results  Component Value Date   CREATININE 0.86 05/07/2022   CREATININE 0.90 05/06/2022   CREATININE 0.98 04/19/2022    Lab Results  Component Value Date   HGBA1C 6.4 04/19/2022   Last diabetic Eye exam:  Lab Results  Component Value Date/Time   HMDIABEYEEXA No Retinopathy 06/16/2021 01:36 PM    Last diabetic Foot exam: No results found for: "HMDIABFOOTEX"      Component Value  Date/Time   CHOL 140 07/13/2021 1336   TRIG 72.0 07/13/2021 1336   HDL 67.00 07/13/2021 1336   CHOLHDL 2 07/13/2021 1336   VLDL 14.4 07/13/2021 1336   LDLCALC 58 07/13/2021 1336   LDLCALC 62 04/11/2021 1421   LDLDIRECT 142.2 12/26/2008 1032       Latest Ref Rng & Units 05/07/2022    4:47 AM 04/05/2022   11:15 AM 06/14/2021    3:13 PM  Hepatic Function  Total Protein 6.5 - 8.1 g/dL 7.2  6.0  5.9   Albumin 3.5 - 5.0 g/dL 3.8  3.9  2.8   AST 15 - 41 U/L $Remo'19  9  11   'KSRdR$ ALT 0 - 44 U/L $Remo'16  12  13   'bYDlJ$ Alk Phosphatase 38 - 126 U/L 92  102  103   Total Bilirubin 0.3 - 1.2 mg/dL 0.6  0.3  0.4     Lab Results  Component Value Date/Time   TSH 3.620 04/05/2022 11:15 AM   TSH 4.26 08/03/2021 08:25 AM       Latest Ref Rng & Units 05/07/2022    4:47 AM 05/06/2022    1:23 PM 06/14/2021    3:13 PM  CBC  WBC 4.0 - 10.5 K/uL 8.0  7.2  6.8   Hemoglobin 13.0 - 17.0 g/dL 12.7  11.6  10.4   Hematocrit 39.0 - 52.0 % 41.3  38.0  32.4   Platelets 150 - 400 K/uL 282  227  247     No results found for: "VD25OH"  Clinical ASCVD: Yes  The ASCVD Risk score (Arnett DK, et al., 2019) failed to calculate for the following reasons:   The 2019 ASCVD risk score is only valid for ages 41 to 66    Other: CHADS2VASc = 5   Social History   Tobacco Use  Smoking Status Former   Packs/day: 1.00   Years: 57.00   Total pack years: 57.00   Types: Cigarettes, Cigars   Quit date: 08/08/2015   Years since quitting: 6.7  Smokeless Tobacco Former   Types: Chew   Quit date: 11/04/1958  Tobacco Comments   Will smoke cigar every once in awhile.  Vaping daily started a couple weeks ago.  04/18/22 hfb   BP Readings from Last 3 Encounters:  05/08/22 123/89  04/19/22 127/65  04/18/22 100/60   Pulse Readings from Last 3 Encounters:  05/08/22 88  04/19/22 (!) 58  04/18/22 67   Wt Readings from Last 3 Encounters:  05/07/22 178 lb 2.1 oz (80.8 kg)  04/19/22 181 lb (82.1 kg)  04/18/22 181 lb 6.4 oz (82.3 kg)     Assessment: Review of patient past medical history, allergies, medications, health status, including review of consultants reports, laboratory and other test data, was performed as part of comprehensive evaluation and provision of chronic care management services.   SDOH:  (Social Determinants of Health) assessments and interventions performed:  SDOH Interventions    Flowsheet Row Most Recent Value  SDOH Interventions   Transportation Interventions Intervention Not Indicated        CCM Care Plan  Allergies  Allergen Reactions   Iodine Other (See Comments)    neck swells   Iohexol Swelling    Neck and gland swelling per patient.    Medications  Reviewed Today     Reviewed by Cherre Robins, RPH-CPP (Pharmacist) on 05/13/22 at 1200  Med List Status: <None>   Medication Order Taking? Sig Documenting Provider Last Dose Status Informant  acetaminophen (TYLENOL) 500 MG tablet [id number] Yes Take 1-2 tablets (500-1,000 mg total) by mouth every 6 (six) hours as needed. Nani Skillern, PA-C Taking Active Multiple Informants, Self, Pharmacy Records  albuterol (PROVENTIL) (2.5 MG/3ML) 0.083% nebulizer solution [id number] Yes Take 3 mLs (2.5 mg total) by nebulization every 6 (six) hours as needed for wheezing or shortness of breath. Spero Geralds, MD Taking Active Multiple Informants, Self, Pharmacy Records           Med Note Darrall Dears, [place] N   Tue May 07, 2022  4:15 AM) Pt states he uses this sometimes but not often  albuterol (VENTOLIN HFA) 108 (90 Base) MCG/ACT inhaler [id number] Yes Inhale 2 puffs into the lungs every 6 (six) hours as needed for wheezing or shortness of breath. [name], NP Taking Active Multiple Informants, Self, Pharmacy Records           Med Note ([place] May 07, 2022  4:16 AM) Pt states he has this but it has been over a month since he has used it  amiodarone (PACERONE) 200 MG tablet [id number] Yes Take 1/2 tablet (100 mg  total) by mouth daily. Liliane Shi, PA-C Taking Active Multiple Informants, Self, Pharmacy Records  atorvastatin (LIPITOR) 80 MG tablet [id number] Yes Take 1 tablet (80 mg total) by mouth daily. Nahser, Wonda Cheng, MD Taking Active Multiple Informants, Self, Pharmacy Records  Budeson-Glycopyrrol-Formoterol [facility] AEROSPHERE) 160-9-4.8 MCG/ACT AERO [id number] Yes Inhale 2 puffs into the lungs in the morning and at bedtime. Spero Geralds, MD Taking Active Multiple Informants, Self, Pharmacy Records           Med Note [facility], [place] B   Mon May 13, 2022 11:45 AM)    furosemide (LASIX) 40 MG tablet [id number] Yes Take 1 tablet (40 mg total) by mouth daily. Nita Sells, MD Taking Active   gabapentin (NEURONTIN) 300 MG capsule [id number] Yes Take 1 capsule (300 mg total) by mouth 3 (three) times daily.  Patient taking differently: Take 300 mg by mouth 2 (two) times daily.   [name], NP Taking Active   Levetiracetam 750 MG TB24 [id number] Yes Take 1 tablet (750 mg total) by mouth at bedtime. Alric Ran, MD Taking Active Multiple Informants, Self, Pharmacy Records    Discontinued 05/08/21 1058 Multiple Vitamin (MULTIVITAMIN WITH MINERALS) TABS tablet [id number] Yes Take 1 tablet by mouth daily in the afternoon. [provider] Taking Active Multiple Informants, Self, Pharmacy Records           Med Note Antony Contras, [place] B   Mon May 13, 2022 11:57 AM)    omeprazole (PRILOSEC) 40 MG capsule [id number] Yes Take 1 capsule (40 mg total) by mouth daily. [name], NP Taking Active   potassium chloride (KLOR-CON) 10 MEQ tablet [id number] Yes TAKE 2 TABLETS BY MOUTH DAILY FOR 2 DAYS THEN REDUCE TO 1 TABLET BY MOUTH DAILY Richardson Dopp T, PA-C Taking Active Multiple Informants, Self, Pharmacy Records  rivaroxaban (XARELTO) 20 MG TABS tablet [id number] Yes TAKE 1 TABLET BY MOUTH DAILY WITH SUPPER  Patient taking differently: Take 20 mg by mouth daily with supper.   Nahser, Wonda Cheng, MD Taking Active Multiple Informants, Self, Pharmacy Records           Med Note [facility],  Edwardo Wojnarowski B   Mon May 13, 2022 11:58 AM)    tamsulosin (FLOMAX) 0.4 MG CAPS capsule [id number] Yes TAKE 1 CAPSULE (0.4 MG TOTAL) BY MOUTH DAILY. 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[place]) 01/15/2022   Unilateral inguinal hernia without obstruction or gangrene 01/15/2022   COPD exacerbation ([place]) 10/03/2021   Persistent atrial fibrillation ([place]) 05/28/2021   Secondary hypercoagulable state ([place]) 05/28/2021   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   Coronary artery disease 05/08/2021   COVID-19 virus infection 04/23/2021   Erectile dysfunction 04/11/2021   Head trauma 03/07/2021   Left leg pain 04/07/2020   Benign prostatic hyperplasia with nocturia 03/08/2020   Seizures ([place]) 02/18/2020   PAF (paroxysmal atrial fibrillation) ([place]) 08/15/2017   Diabetes type 2, controlled ([place]) 07/31/2017   COPD GOLD II  04/03/2017   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[place]) 09/06/2015   Lung cancer ([place]) 11/07/2014   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   Essential hypertension 04/29/2014   Meningioma ([place]) 10/25/2013   Low back pain 10/25/2013   Osteoarthritis 08/18/2012   Atypical chest pain 08/14/2011   KERATOSIS 10/09/2010   SCIATICA, RIGHT 10/09/2010   UNSPECIFIED HEARING LOSS 05/21/2010   Hyperlipidemia LDL goal <70 05/14/2010   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   RENAL CYST, RIGHT 02/05/2010   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[place]) 01/29/2010   LIPOMA 11/17/2009   MICROSCOPIC HEMATURIA 08/11/2008   GERD 07/26/2008   DERMOID CYST 07/25/2008     Immunization History  Administered Date(s) Administered   Fluad Quad(high Dose 65+) 07/23/2019, 09/04/2020, 07/13/2021   Influenza Split 08/02/2011, 08/18/2012   Influenza Whole 08/17/2008   Influenza, High Dose Seasonal PF 08/09/2015, 09/30/2016, 07/30/2017, 08/07/2018   Influenza,inj,Quad PF,6+ Mos 07/27/2013, 08/09/2014   PFIZER Comirnaty(Gray Top)Covid-19 Tri-Sucrose Vaccine 02/16/2021   PFIZER(Purple Top)SARS-COV-2 Vaccination 01/28/2020, 02/21/2020, 09/04/2020   Pfizer Covid-19 Vaccine Bivalent Booster 6yrs & up 08/03/2021   Pneumococcal Conjugate-13 08/09/2014   Pneumococcal Polysaccharide-23 05/14/2010   Td 07/25/2008    Conditions to be addressed/monitored: Atrial Fibrillation, CHF, HTN, HLD, COPD, and Gout  Care Plan : General Pharmacy (Adult)  Updates made by Cherre Robins, RPH-CPP since 05/13/2022 12:00 AM     Problem: Management of chronic medicaiton conditions and medication: Atrial Fibrillation, HTN, HLD, type 2 DM, Pulmonary Disease and BPH; seizures; h/o lung cancer and meningioma; GERD; aortic aneurysm   Priority: High  Onset Date: 02/08/2021  Note:   Current Barriers:  Does not adhere to prescribed medication regimen Management of chronic medical conditions and medication management Unable to afford medication therapy - improved Education needed regarding chronic conditions and self management  Pharmacist Clinical Goal(s):  Over the next 180 days, patient will achieve adherence to monitoring guidelines and medication adherence to achieve therapeutic efficacy contact provider office for questions/concerns as evidenced notation of same in electronic health record through collaboration with PharmD and provider.  Provide education regarding checking weight and monitoring for signs and symptoms of fluid retention.  Provide education regarding proper inhaler use - maintenance versus rescue.   Interventions: 1:1 collaboration with [name], NP  regarding development and update of  comprehensive plan of care as evidenced by provider attestation and co-signature Inter-disciplinary care team collaboration (see longitudinal plan of care) Comprehensive medication review performed; medication list updated in electronic medical record  Diabetes: Controlled; A1c goal < 7.0% Lab Results  Component Value Date   HGBA1C 6.4 04/19/2022  Current treatment:  none Current glucose readings: does not check BG at home Current meal patterns: eating a little better since CABG 05/2021. Reports he has difficulty resisting cherry pie Patient reports he has seen optometrist 3 months ago but he does not feel that he can see as well with his new glasses.  Noted to have neuropathy at last visit. Gabapentin started 300mg  3 times a day. Patient states he is mostly taking 2 times a day. Denies drowsiness. Reports improved nerve related pain.  Interventions:   Educated on limiting foods containing carbohydrates and sugar; recommended increase non starchy vegetables Counseled on A1c goal.  Patient encouraged to follow up with optometrist regarding vision and new glasses Continue gabapentin 300mg  2 to 3 times a day - warned patient not to drive if drowsy.  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 Also has not been taking potassium supplement 11/29/2021 - he reported that he is taking furosemide some day when he is not working. This past week took Saturday, Sunday and Monday night. Reports that breathing and abdominal fullness has improved. He  reports his weight is down about 9 lbs. Per patient (last week 180 lbs and today 171lbs)   Recent hospitaliztion for plural effusion 07/03 to 05/08/2022. Patient states he is taking furosemide daily since he left hospital 05/08/2022 Patient checks BP at home infrequently - no readings provided EF was 60 to 65% 04/2021 Denies hypotensive/hypertensive symptoms Reports occasional LEE (greater in evening)  Has not been checking weight regularly at home.  Interventions:  Educated on  blood pressure goals Continue to take furosemide and potassium  Contact office if you have worsening abdominal fullness or any of these other symptoms. Weight gain, shortness of breath, abdominal fullness, swelling in legs or abdomen, Fatigue and weakness, changes in ability to perform usual activities, persistent cough or wheezing with white or pink blood-tinged mucus, nausea and lack of appetite Restart checking weight daily - report weight gain of more than 2 to 3 lbs in 24 hours or 5 lbs in 1 week Discussed low sodium diet.    Hyperlipidemia / CAD (LDL < 70) Controlled Lab Results  Component Value Date   CHOL 140 07/13/2021   HDL 67.00 07/13/2021   LDLCALC 58 07/13/2021   LDLDIRECT 142.2 12/26/2008   TRIG 72.0 07/13/2021   CHOLHDL 2 07/13/2021  CABG x 3 vessels 05/08/2021.   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 Recommended try to increase intake of non starchy vegetables Recommended continue current therapy.  Consider recheck lipids with next labs  Chronic Obstructive Pulmonary Disease:  Controlled COPD monitored by Dr Shearon Stalls - last visit 01/11/2022 Current treatment:  albuterol inhaler or nebulizer solution use as needed  for shortness of breath or wheezing (rescue inhaler) Breztri - inhaler two puffs into lungs twice a day  [place] patient [place] approved 11/13/2021. Patient reports he has received third shipment of Breztri.  Patient confirms he did receive nebulizer but not sure that he wants to keep it since he wife also has one he can use.  GOLD Classification: 2 (FEV1 50-79%) Most recent Pulmonary Function Testing: 05/19/2017 - spirometry FVC 2.84 (73%) FEV1 1.74 (62%) FEV1/FVC 61% Current COPD Classification:  B (CATscore >10, <2 exacerbations/yr)  Has not needed albuterol inhaler or nebulizer in the last 2 weeks Interventions:  Education provided about inhaler technique for [employer]. Reminded patient of instructions to use 2 puffs twice a day and to remember to rinse mouth after each use.  Education provided about rescue and maintenance inhaler Take Breztri - inhaler 2 puffs into lungs twice a day. Remember to rinse mouth after each use.  (Maintenance inhaler - should be taken EVERY day)  Albuterol inhaler or nebulizer solution is your rescue medications - it can be used when you are wheezing. Contact office if you are using albuterol more than 2 times per week.    Atrial Fibrillation: NSR since last hospitalization but did recently change from diltiazem to amiodarone due to being in afib prior to cardiac cath Current rate/rhythm controller:  amiodarone $RemoveBef'200mg'UodxcdLCHa$  daily Anticoagulant treatment:  Xarelto $Remove'20mg'fhhyCBT$  daily with evening meal CHADS2VASc score: 5  08/22/2021: Coordinated with patient assistance program to verify he was approved for Xarelto assistance through 11/03/2021. Patient has been mailed card to use at pharmacy for free Xarelto. I was also faxed a copy and called [place] pharmacy to give the information to fill Xarelto now.  Will continue to follow in 2023. Cannot apply for Xarelto patient assistance program until reaches coverage gap. Interventions: (addressed at previous  visit) Recommended continue current regimen Patient to have LFTs and TSH monitored by cardiologist regularly due to amiodarone therapy Discussed patient assistance program for xarelto - cannot apply until reaches coverage gap and spends 4% of income out of pocket.  Performed medication cost assessment through [employer]; Patient will likely reach Medicare coverage gap in August 2023.  Take Xarelto once a day with evening meal - improves absorption.   GERD:  Patient is currently controlled on the following medications:  Omeprazole $RemoveBef'40mg'mYTpnltUAu$  daily Patient has failed these meds in past: None noted  Triggers: laying down after eating Interventions: (addressed at previous visit)  Recommend continue current medications     Patient Goals/Self-Care Activities Over the next 90 days, patient will:  take medications as prescribed and collaborate with provider on medication access solutions Restart checking weight daily - report weight gain of more than 2 to 3 lbs in 24 hours or 5 lbs in 1 week; Contact office if you have worsening abdominal fullness or any of these other symptoms. Weight gain, shortness of breath, abdominal fullness, swelling in legs or abdomen, Fatigue and weakness, changes in ability to perform usual activities, persistent cough or wheezing with white or pink blood-tinged mucus, nausea and lack of appetite Take Breztri - inhaler 2 puffs into lungs twice a day. Remember to rinse mouth after each use.  (Maintenance inhaler - should be taken EVERY day)  Albuterol inhaler or nebulizer solution is your rescue medications - it can be used when you are wheezing. Contact office if  you are using albuterol more than 2 times per week.  Take Xarelto once a day with evening meal - improves absorption.  Also take last dose of gabapentin at bedtime and take levetiracetam 750mg  at bedtime.    Follow Up Plan: Telephone follow up appointment with care management team member scheduled for:  Will see PCP   05/15/2022 - Clinical Pharmacist Practitioner follow up in 1 month                  Medication Assistance:  Breztri obtained through [facility] and Me medication assistance program.  Enrollment ends 11/03/2022  .  Usually hit coverage gap with Xarelto but they do not have medication assistance program for Medicare patients for 2023. Patient has not hit coverage gap yet.    Patient's preferred pharmacy is:  [facility] [address] [place] [address] Phone: [phone] Fax: [phone]   Follow Up:  Patient agrees to Care Plan and Follow-up.  Plan: Telephone follow up appointment with care management team member scheduled for:  05/15/2022 will see PCP; 1 month clinical pharmacist.  .   Cherre Robins, PharmD Clinical Pharmacist [place] [facility]

## 2022-05-13 NOTE — Telephone Encounter (Signed)
Called and discussed results with atypical cells on cytology from pleural fluid. I've ordered PET/CT. Can we postpone his clinic visit with [place] 2-3 weeks to ensure PET/CT done before his appointment?

## 2022-05-13 NOTE — Patient Instructions (Signed)
Mr. Poser It was a pleasure speaking with you  Below is a summary of your health goals and care plan  Patient Goals/Self-Care Activities:  take medications as prescribed and collaborate with provider on medication access solutions Restart checking weight daily - report weight gain of more than 2 to 3 lbs in 24 hours or 5 lbs in 1 week; Contact office if you have worsening abdominal fullness or any of these other symptoms. Weight gain, shortness of breath, abdominal fullness, swelling in legs or abdomen, Fatigue and weakness, changes in ability to perform usual activities, persistent cough or wheezing with white or pink blood-tinged mucus, nausea and lack of appetite Take Breztri - inhaler 2 puffs into lungs twice a day. Remember to rinse mouth after each use.  (Maintenance inhaler - should be taken EVERY day)  Albuterol inhaler or nebulizer solution is your rescue medications - it can be used when you are wheezing. Contact office if you are using albuterol more than 2 times per week.  Take Xarelto once a day with evening meal - improves absorption.  Also take last dose of gabapentin at bedtime and take levetiracetam 750mg  at bedtime.   Follow Up Plan: Telephone follow up appointment with care management team member scheduled for:  Will see PCP  05/15/2022 - Clinical Pharmacist Practitioner follow up in 1 month     If you have any questions or concerns, please feel free to contact me either at the phone number below or with a MyChart message.   Keep up the good work!  Cherre Robins, PharmD Clinical Pharmacist [facility] [place] [facility] [phone] (direct line)  [phone] (main office number)   The patient verbalized understanding of instructions, educational materials, and care plan provided today and agreed to receive a mailed copy of patient instructions, educational materials, and care plan.   Low-Sodium Eating Plan Sodium, which is an element that makes up salt,  helps you maintain a healthy balance of fluids in your body. Too much sodium can increase your blood pressure and cause fluid and waste to be held in your body. Your health care provider or dietitian may recommend following this plan if you have high blood pressure (hypertension), kidney disease, liver disease, or heart failure. Eating less sodium can help lower your blood pressure, reduce swelling, and protect your heart, liver, and kidneys. What are tips for following this plan? Reading food labels The Nutrition Facts label lists the amount of sodium in one serving of the food. If you eat more than one serving, you must multiply the listed amount of sodium by the number of servings. Choose foods with less than 140 mg of sodium per serving. Avoid foods with 300 mg of sodium or more per serving. Shopping  Look for lower-sodium products, often labeled as "low-sodium" or "no salt added." Always check the sodium content, even if foods are labeled as "unsalted" or "no salt added." Buy fresh foods. Avoid canned foods and pre-made or frozen meals. Avoid canned, cured, or processed meats. Buy breads that have less than 80 mg of sodium per slice. Cooking  Eat more home-cooked food and less restaurant, buffet, and fast food. Avoid adding salt when cooking. Use salt-free seasonings or herbs instead of table salt or sea salt. Check with your health care provider or pharmacist before using salt substitutes. Cook with plant-based oils, such as canola, sunflower, or olive oil. Meal planning When eating at a restaurant, ask that your food be prepared with less salt or  no salt, if possible. Avoid dishes labeled as brined, pickled, cured, smoked, or made with soy sauce, miso, or teriyaki sauce. Avoid foods that contain MSG (monosodium glutamate). MSG is sometimes added to [place] food, bouillon, and some canned foods. Make meals that can be grilled, baked, poached, roasted, or steamed. These are generally made  with less sodium. General information Most people on this plan should limit their sodium intake to 1,500-2,000 mg (milligrams) of sodium each day. What foods should I eat? Fruits Fresh, frozen, or canned fruit. Fruit juice. Vegetables Fresh or frozen vegetables. "No salt added" canned vegetables. "No salt added" tomato sauce and paste. Low-sodium or reduced-sodium tomato and vegetable juice. Grains Low-sodium cereals, including oats, puffed wheat and rice, and shredded wheat. Low-sodium crackers. Unsalted rice. Unsalted pasta. Low-sodium bread. Whole-grain breads and whole-grain pasta. Meats and other proteins Fresh or frozen (no salt added) meat, poultry, seafood, and fish. Low-sodium canned tuna and salmon. Unsalted nuts. Dried peas, beans, and lentils without added salt. Unsalted canned beans. Eggs. Unsalted nut butters. Dairy Milk. Soy milk. Cheese that is naturally low in sodium, such as ricotta cheese, fresh mozzarella, or Swiss cheese. Low-sodium or reduced-sodium cheese. Cream cheese. Yogurt. Seasonings and condiments Fresh and dried herbs and spices. Salt-free seasonings. Low-sodium mustard and ketchup. Sodium-free salad dressing. Sodium-free light mayonnaise. Fresh or refrigerated horseradish. Lemon juice. Vinegar. Other foods Homemade, reduced-sodium, or low-sodium soups. Unsalted popcorn and pretzels. Low-salt or salt-free chips. The items listed above may not be a complete list of foods and beverages you can eat. Contact a dietitian for more information. What foods should I avoid? Vegetables Sauerkraut, pickled vegetables, and relishes. Olives. [place] fries. Onion rings. Regular canned vegetables (not low-sodium or reduced-sodium). Regular canned tomato sauce and paste (not low-sodium or reduced-sodium). Regular tomato and vegetable juice (not low-sodium or reduced-sodium). Frozen vegetables in sauces. Grains Instant hot cereals. Bread stuffing, pancake, and biscuit mixes.  Croutons. Seasoned rice or pasta mixes. Noodle soup cups. Boxed or frozen macaroni and cheese. Regular salted crackers. Self-rising flour. Meats and other proteins Meat or fish that is salted, canned, smoked, spiced, or pickled. Precooked or cured meat, such as sausages or meat loaves. Berniece Salines. Ham. Pepperoni. Hot dogs. Corned beef. Chipped beef. Salt pork. Jerky. Pickled herring. Anchovies and sardines. Regular canned tuna. Salted nuts. Dairy Processed cheese and cheese spreads. Hard cheeses. Cheese curds. Blue cheese. Feta cheese. String cheese. Regular cottage cheese. Buttermilk. Canned milk. Fats and oils Salted butter. Regular margarine. Ghee. Bacon fat. Seasonings and condiments Onion salt, garlic salt, seasoned salt, table salt, and sea salt. Canned and packaged gravies. Worcestershire sauce. Tartar sauce. Barbecue sauce. Teriyaki sauce. Soy sauce, including reduced-sodium. Steak sauce. Fish sauce. Oyster sauce. Cocktail sauce. Horseradish that you find on the shelf. Regular ketchup and mustard. Meat flavorings and tenderizers. Bouillon cubes. Hot sauce. Pre-made or packaged marinades. Pre-made or packaged taco seasonings. Relishes. Regular salad dressings. Salsa. Other foods Salted popcorn and pretzels. Corn chips and puffs. Potato and tortilla chips. Canned or dried soups. Pizza. Frozen entrees and pot pies. The items listed above may not be a complete list of foods and beverages you should avoid. Contact a dietitian for more information. Summary Eating less sodium can help lower your blood pressure, reduce swelling, and protect your heart, liver, and kidneys. Most people on this plan should limit their sodium intake to 1,500-2,000 mg (milligrams) of sodium each day. Canned, boxed, and frozen foods are high in sodium. Restaurant foods, fast foods, and pizza are also very  high in sodium. You also get sodium by adding salt to food. Try to cook at home, eat more fresh fruits and vegetables, and  eat less fast food and canned, processed, or prepared foods. This information is not intended to replace advice given to you by your health care provider. Make sure you discuss any questions you have with your health care provider. Document Revised: 11/26/2019 Document Reviewed: 09/22/2019 Elsevier Patient Education  [place].

## 2022-05-15 ENCOUNTER — Inpatient Hospital Stay: Payer: Medicare Other | Admitting: Family

## 2022-05-15 ENCOUNTER — Telehealth: Payer: Self-pay | Admitting: Student

## 2022-05-15 DIAGNOSIS — J910 Malignant pleural effusion: Secondary | ICD-10-CM

## 2022-05-15 NOTE — Telephone Encounter (Signed)
Orders placed for [facility]

## 2022-05-15 NOTE — Progress Notes (Shared)
Subjective:   By signing my name below, I, Vickey Sages, attest that this documentation has been prepared under the direction and in the presence of Sandford Craze, NP 05/15/2022.     Patient ID: Robert Bates, male    DOB: Nov 29, 1940, 81 y.o.   MRN: [id number]  No chief complaint on file.   HPI Patient is in today for a hospital follow up.   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   COPD (chronic obstructive pulmonary disease) (HCC)    Coronary artery disease    Diabetes type 2, controlled (HCC) 07/31/2017   Dyspnea    Dysrhythmia    a fib   GERD (gastroesophageal reflux disease)    Hematuria    refuses work up or referral - understands risks of morbidity / mortality - 11/2008, 12/2008   History of hiatal hernia    History of kidney stones    Hyperlipemia    Meningioma (HCC) 10/25/2013   Follows with Dr. Coletta Memos.    Peripheral vascular disease (HCC)    Abdominal Aortic Aneursym   Pneumonia    as a child   Radiation 09/18/15-10/25/15   left lower lobe 70.2 Gy   Seizures (HCC) 02/18/2020   Tobacco abuse     Past Surgical History:  Procedure Laterality Date   CHOLECYSTECTOMY N/A 07/23/2017   Procedure: LAPAROSCOPIC CHOLECYSTECTOMY;  Surgeon: Rodman Pickle, MD;  Location: [facility];  Service: General;  Laterality: N/A;   CLIPPING OF ATRIAL APPENDAGE Left 05/08/2021   Procedure: CLIPPING OF ATRIAL APPENDAGE USING 45 ATRICLIP;  Surgeon: Linden Dolin, MD;  Location: [facility];  Service: Open Heart Surgery;  Laterality: Left;   COLONOSCOPY     CORONARY ARTERY BYPASS GRAFT N/A 05/08/2021   Procedure: CORONARY ARTERY BYPASS GRAFTING (CABG)X 3 USING LEFT INTERNAL MAMMARY ARTERY AND RIGHT GREATER SAPEHNOUS  VEIN;  Surgeon: Linden Dolin, MD;  Location: [facility];  Service: Open Heart Surgery;  Laterality: N/A;   ENDOVEIN HARVEST OF GREATER SAPHENOUS VEIN Right 05/08/2021   Procedure: ENDOVEIN HARVEST OF GREATER SAPHENOUS VEIN;  Surgeon: Linden Dolin, MD;  Location: [facility];  Service: Open Heart Surgery;  Laterality: Right;   EYE SURGERY Bilateral    Cataracts removed w/ lens implant   HERNIA REPAIR     Left 36 years ago . Right inguinal hernia repair 10-01-17 Dr. Sheliah Hatch   INGUINAL HERNIA REPAIR Right 10/01/2017   Procedure: RIGHT INGUINAL HERNIA REPAIR WITH MESH;  Surgeon: Kinsinger, De Blanch, MD;  Location: [facility];  Service: General;  Laterality: Right;  TAP BLOCK   INSERTION OF MESH Right 10/01/2017   Procedure: INSERTION OF MESH;  Surgeon: Sheliah Hatch De Blanch, MD;  Location: [facility];  Service: General;  Laterality: Right;   IR THORACENTESIS ASP PLEURAL SPACE W/IMG GUIDE  05/18/2021   IR THORACENTESIS ASP PLEURAL SPACE W/IMG GUIDE  06/07/2021   LEFT HEART CATH AND CORONARY ANGIOGRAPHY N/A 04/20/2021   Procedure: LEFT HEART CATH AND CORONARY ANGIOGRAPHY;  Surgeon: Iran Ouch, MD;  [facility];  Service: Cardiovascular;  Laterality: N/A;   TEE WITHOUT CARDIOVERSION N/A 05/08/2021   Procedure: TRANSESOPHAGEAL ECHOCARDIOGRAM (TEE);  Surgeon: Linden Dolin, MD;  [facility]: [facility];  Service: Open Heart Surgery;  Laterality: N/A;   TONSILLECTOMY     TONSILLECTOMY     VIDEO BRONCHOSCOPY Bilateral 07/26/2015   Procedure: VIDEO BRONCHOSCOPY WITH FLUORO;  Surgeon: [name], MD;  [facility]: [facility];  Service: Cardiopulmonary;  Laterality: Bilateral;   VIDEO BRONCHOSCOPY WITH ENDOBRONCHIAL NAVIGATION N/A 08/23/2015   Procedure: VIDEO BRONCHOSCOPY WITH ENDOBRONCHIAL NAVIGATION;  Surgeon: Grace Isaac, MD;  [facility];  Service: Thoracic;  Laterality: N/A;   VIDEO BRONCHOSCOPY WITH ENDOBRONCHIAL ULTRASOUND N/A 08/23/2015   Procedure: VIDEO BRONCHOSCOPY WITH  ENDOBRONCHIAL ULTRASOUND;  Surgeon: Grace Isaac, MD;  Location: [facility];  Service: Thoracic;  Laterality: N/A;    Family History  Problem Relation Age of Onset   Leukemia Father    Emphysema Father    Learning disabilities Son    Atrial fibrillation Son    Leukemia Other    Stroke Other     Social History   Socioeconomic History   Marital status: Married    Spouse name: Not on file   Number of children: 2   Years of education: Not on file   Highest education level: Not on file  Occupational History   Occupation: Retired    Fish farm manager: [employer]    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[employer]  Tobacco Use   Smoking status: Former    Packs/day: 1.00    Years: 57.00    Total pack years: 57.00    Types: Cigarettes, Cigars    Quit date: 08/08/2015    Years since quitting: 6.7   Smokeless tobacco: Former    Types: Chew    Quit date: 11/04/1958   Tobacco comments:    Will smoke cigar every once in awhile.  Vaping daily started a couple weeks ago.  04/18/22 hfb  Vaping Use   Vaping Use: Former  Substance and Sexual Activity   Alcohol use: Not Currently    Alcohol/week: 0.0 standard drinks of alcohol   Drug use: No   Sexual activity: Not Currently  Other Topics Concern   Not on file  Social History Narrative   Not on file   Social Determinants of Health   Financial Resource Strain: Medium Risk (11/22/2021)   Overall Financial Resource Strain (CARDIA)    Difficulty of Paying Living Expenses: Somewhat hard  Food Insecurity: No Food Insecurity (09/25/2021)   Hunger Vital Sign    Worried About Running Out of Food in the Last Year: Never true    Ran Out of Food in the Last Year: Never true  Transportation Needs: No Transportation Needs (05/13/2022)   PRAPARE - Hydrologist (Medical): No    Lack of Transportation (Non-Medical): No  Physical Activity: Insufficiently Active (08/05/2021)   Exercise Vital Sign    Days of Exercise per Week: 2  days    Minutes of Exercise per Session: 40 min  Stress: No Stress Concern Present (10/01/2021)   [place]    Feeling of Stress : Not at all  Social Connections: Moderately Isolated (05/13/2022)   Social Connection and Isolation Panel [NHANES]    Frequency of Communication with Friends and Family: Twice a week    Frequency of Social Gatherings with Friends and Family: Once a week    Attends Religious Services: Never    Marine scientist or Organizations: No    Attends Archivist Meetings: Never    Marital  Status: Married  Human resources officer Violence: Not At Risk (10/01/2021)   Humiliation, Afraid, Rape, and Kick questionnaire    Fear of Current or Ex-Partner: No    Emotionally Abused: No    Physically Abused: No    Sexually Abused: No    Outpatient Medications Prior to Visit  Medication Sig Dispense Refill   acetaminophen (TYLENOL) 500 MG tablet Take 1-2 tablets (500-1,000 mg total) by mouth every 6 (six) hours as needed. 30 tablet 0   albuterol (PROVENTIL) (2.5 MG/3ML) 0.083% nebulizer solution Take 3 mLs (2.5 mg total) by nebulization every 6 (six) hours as needed for wheezing or shortness of breath. 75 mL 12   albuterol (VENTOLIN HFA) 108 (90 Base) MCG/ACT inhaler Inhale 2 puffs into the lungs every 6 (six) hours as needed for wheezing or shortness of breath. 8.5 g 5   amiodarone (PACERONE) 200 MG tablet Take 1/2 tablet (100 mg total) by mouth daily. 45 tablet 3   atorvastatin (LIPITOR) 80 MG tablet Take 1 tablet (80 mg total) by mouth daily. 90 tablet 3   Budeson-Glycopyrrol-Formoterol (BREZTRI AEROSPHERE) 160-9-4.8 MCG/ACT AERO Inhale 2 puffs into the lungs in the morning and at bedtime. 10.7 g 5   furosemide (LASIX) 40 MG tablet Take 1 tablet (40 mg total) by mouth daily. 30 tablet 1   gabapentin (NEURONTIN) 300 MG capsule Take 1 capsule (300 mg total) by mouth 3 (three) times daily. (Patient taking  differently: Take 300 mg by mouth 2 (two) times daily.) 90 capsule 1   Levetiracetam 750 MG TB24 Take 1 tablet (750 mg total) by mouth at bedtime. 30 tablet 11   Multiple Vitamin (MULTIVITAMIN WITH MINERALS) TABS tablet Take 1 tablet by mouth daily in the afternoon.     omeprazole (PRILOSEC) 40 MG capsule Take 1 capsule (40 mg total) by mouth daily. 90 capsule 1   potassium chloride (KLOR-CON) 10 MEQ tablet TAKE 2 TABLETS BY MOUTH DAILY FOR 2 DAYS THEN REDUCE TO 1 TABLET BY MOUTH DAILY 92 tablet 3   rivaroxaban (XARELTO) 20 MG TABS tablet TAKE 1 TABLET BY MOUTH DAILY WITH SUPPER (Patient taking differently: Take 20 mg by mouth daily with supper.) 30 tablet 5   tamsulosin (FLOMAX) 0.4 MG CAPS capsule TAKE 1 CAPSULE (0.4 MG TOTAL) BY MOUTH DAILY. 90 capsule 1   No facility-administered medications prior to visit.    Allergies  Allergen Reactions   Iodine Other (See Comments)    neck swells   Iohexol Swelling    Neck and gland swelling per patient.    ROS     Objective:    Physical Exam Constitutional:      General: He is not in acute distress.    Appearance: Normal appearance. He is not ill-appearing.  HENT:     Head: Normocephalic and atraumatic.     Right Ear: External ear normal.     Left Ear: External ear normal.  Eyes:     Extraocular Movements: Extraocular movements intact.     Pupils: Pupils are equal, round, and reactive to light.  Cardiovascular:     Rate and Rhythm: Normal rate and regular rhythm.     Pulses: Normal pulses.     Heart sounds: Normal heart sounds. No murmur heard.    No gallop.  Pulmonary:     Effort: Pulmonary effort is normal. No respiratory distress.     Breath sounds: Normal breath sounds. No wheezing or rales.  Skin:    General: Skin is warm and dry.  Neurological:     Mental Status: He is alert and oriented to person, place, and time.  Psychiatric:        Mood and Affect: Mood normal.        Behavior: Behavior normal.        Judgment:  Judgment normal.     There were no vitals taken for this visit. Wt Readings from Last 3 Encounters:  05/07/22 178 lb 2.1 oz (80.8 kg)  04/19/22 181 lb (82.1 kg)  04/18/22 181 lb 6.4 oz (82.3 kg)    Diabetic Foot Exam - Simple   No data filed    Lab Results  Component Value Date   WBC 8.0 05/07/2022   HGB 12.7 (L) 05/07/2022   HCT 41.3 05/07/2022   PLT 282 05/07/2022   GLUCOSE 176 (H) 05/07/2022   CHOL 140 07/13/2021   TRIG 72.0 07/13/2021   HDL 67.00 07/13/2021   LDLDIRECT 142.2 12/26/2008   LDLCALC 58 07/13/2021   ALT 16 05/07/2022   AST 19 05/07/2022   NA 141 05/07/2022   K 4.1 05/07/2022   CL 105 05/07/2022   CREATININE 0.86 05/07/2022   BUN 20 05/07/2022   CO2 25 05/07/2022   TSH 3.620 04/05/2022   PSA 1.61 03/09/2020   INR 1.4 (H) 05/08/2021   HGBA1C 6.4 04/19/2022   MICROALBUR 0.9 01/15/2022    Lab Results  Component Value Date   TSH 3.620 04/05/2022   Lab Results  Component Value Date   WBC 8.0 05/07/2022   HGB 12.7 (L) 05/07/2022   HCT 41.3 05/07/2022   MCV 87.7 05/07/2022   PLT 282 05/07/2022   Lab Results  Component Value Date   NA 141 05/07/2022   K 4.1 05/07/2022   CHLORIDE 103 06/17/2017   CO2 25 05/07/2022   GLUCOSE 176 (H) 05/07/2022   BUN 20 05/07/2022   CREATININE 0.86 05/07/2022   BILITOT 0.6 05/07/2022   ALKPHOS 92 05/07/2022   AST 19 05/07/2022   ALT 16 05/07/2022   PROT 7.2 05/07/2022   ALBUMIN 3.8 05/07/2022   CALCIUM 9.2 05/07/2022   ANIONGAP 11 05/07/2022   EGFR 85 04/05/2022   GFR 72.66 04/19/2022   Lab Results  Component Value Date   CHOL 140 07/13/2021   Lab Results  Component Value Date   HDL 67.00 07/13/2021   Lab Results  Component Value Date   LDLCALC 58 07/13/2021   Lab Results  Component Value Date   TRIG 72.0 07/13/2021   Lab Results  Component Value Date   CHOLHDL 2 07/13/2021   Lab Results  Component Value Date   HGBA1C 6.4 04/19/2022       Assessment & Plan:   Problem List Items  Addressed This Visit   None  No orders of the defined types were placed in this encounter.   I, Kellie Simmering, personally preformed the services described in this documentation.  All medical record entries made by the scribe were at my direction and in my presence.  I have reviewed the chart and discharge instructions (if applicable) and agree that the record reflects my personal performance and is accurate and complete. 05/15/2022.  I,Mohammed Iqbal,acting as a Education administrator for [employer], NP.,have documented all relevant documentation on the behalf of Nance Pear, NP,as directed by  Nance Pear, NP while in the presence of Nance Pear, NP.  Mohammed Raymer

## 2022-05-16 ENCOUNTER — Ambulatory Visit: Payer: Medicare Other | Admitting: Internal Medicine

## 2022-05-17 ENCOUNTER — Encounter (HOSPITAL_COMMUNITY): Payer: Medicare Other

## 2022-05-20 ENCOUNTER — Ambulatory Visit (INDEPENDENT_AMBULATORY_CARE_PROVIDER_SITE_OTHER): Payer: Medicare Other | Admitting: Family

## 2022-05-20 VITALS — BP 125/67 | HR 64 | Temp 97.9°F | Resp 16 | Wt 181.0 lb

## 2022-05-20 DIAGNOSIS — I77819 Aortic ectasia, unspecified site: Secondary | ICD-10-CM | POA: Diagnosis not present

## 2022-05-20 DIAGNOSIS — D649 Anemia, unspecified: Secondary | ICD-10-CM

## 2022-05-20 DIAGNOSIS — J90 Pleural effusion, not elsewhere classified: Secondary | ICD-10-CM

## 2022-05-20 DIAGNOSIS — I10 Essential (primary) hypertension: Secondary | ICD-10-CM

## 2022-05-20 DIAGNOSIS — N281 Cyst of kidney, acquired: Secondary | ICD-10-CM | POA: Diagnosis not present

## 2022-05-20 DIAGNOSIS — C3490 Malignant neoplasm of unspecified part of unspecified bronchus or lung: Secondary | ICD-10-CM | POA: Diagnosis not present

## 2022-05-20 NOTE — Assessment & Plan Note (Signed)
Patient is advised to keep his upcoming appointments with pulmonology and oncology.

## 2022-05-20 NOTE — Assessment & Plan Note (Signed)
>>  ASSESSMENT AND PLAN FOR AORTIC DILATATION (HCC) WRITTEN ON 05/20/2022  2:26 PM BY O'SULLIVAN, Maci Eickholt, NP  Plan 3 year follow up US for surveillance.

## 2022-05-20 NOTE — Patient Instructions (Addendum)
You can add mucinex 600mg  twice daily at bedtime. For sleep you can use melatonin 1 mg at bedtime. Complete lab work prior to leaving. Complete chest x-ray on the first floor. Keep your upcoming appointments with Dr. Earlie Server and the lung doctor.

## 2022-05-20 NOTE — Assessment & Plan Note (Signed)
Clinically stable today. Obtain follow up CXR.

## 2022-05-20 NOTE — Progress Notes (Signed)
Subjective:     Patient ID: Robert Bates, male    DOB: [birth year], 81 y.o.   MRN: [id number]  Chief Complaint  Patient presents with   Follow-up    Here for hospital follow up    HPI Patient presented to the ED on 05/06/22 due to chest pain/shortness of breath. Further evaluation noted moderate to large left pleural effusion.  He was found to have a pleural effusion.  He underwent thoracentesis which yielded 1 liter of fluid.  Cytology was concerning for malignancy.   He returned home on 7/5 and reports that he has been doing well.   CTA also noted mild aneurysmal dilatation upper abdominal Aorta (3.1 cm). Incidental ote was made of Increased size of exophytic lesion off the mid pole left kidney now measuring 2.3 cm, previously 1.2 cm.  This prompted [place] to order an MR abdomen which noted:  Indeterminate lesion in question appears to be a hemorrhagic cyst. No internal enhancement identified given limitations of motion. A few additional smaller simple and hemorrhagic renal cysts noted.    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   COPD (chronic obstructive pulmonary disease) (HCC)    Coronary artery disease    Diabetes type 2, controlled ([place]) 07/31/2017   Dyspnea    Dysrhythmia    a fib   GERD (gastroesophageal reflux disease)    Hematuria    refuses work up or referral - understands risks of morbidity / mortality - 11/2008, 12/2008   History of hiatal hernia    History of kidney stones    Hyperlipemia     Meningioma ([place]) 10/25/2013   Follows with Dr. [name].    Peripheral vascular disease ([place])    Abdominal Aortic Aneursym   Pneumonia    as a child   Radiation 09/18/15-10/25/15   left lower lobe 70.2 Gy   Seizures ([place]) 02/18/2020   Tobacco abuse     Past Surgical History:  Procedure Laterality Date   CHOLECYSTECTOMY N/A 07/23/2017   Procedure: LAPAROSCOPIC CHOLECYSTECTOMY;  Surgeon: Mickeal Skinner, MD;  Location: [facility];  Service: General;  Laterality: N/A;   CLIPPING OF ATRIAL APPENDAGE Left 05/08/2021   Procedure: CLIPPING OF ATRIAL APPENDAGE USING 51 ATRICLIP;  Surgeon: Wonda Olds, MD;  Location: [facility];  Service: Open Heart Surgery;  Laterality: Left;   COLONOSCOPY     CORONARY ARTERY BYPASS GRAFT N/A 05/08/2021   Procedure: CORONARY ARTERY BYPASS GRAFTING (CABG)X 3 USING LEFT INTERNAL MAMMARY ARTERY AND RIGHT GREATER SAPEHNOUS VEIN;  Surgeon: Wonda Olds, MD;  Location: [place];  Service: Open Heart Surgery;  Laterality: N/A;   ENDOVEIN HARVEST OF GREATER SAPHENOUS VEIN Right 05/08/2021   Procedure: ENDOVEIN HARVEST OF GREATER SAPHENOUS VEIN;  Surgeon: Wonda Olds, MD;  Location: [place];  Service: Open Heart Surgery;  Laterality: Right;   EYE SURGERY Bilateral    Cataracts removed w/ lens implant   HERNIA  REPAIR     Left 36 years ago . Right inguinal hernia repair 10-01-17 Dr. Kieth Brightly   INGUINAL HERNIA REPAIR Right 10/01/2017   Procedure: RIGHT INGUINAL HERNIA REPAIR WITH MESH;  Surgeon: Kinsinger, Arta Bruce, MD;  Location: [facility];  Service: General;  Laterality: Right;  TAP BLOCK   INSERTION OF MESH Right 10/01/2017   Procedure: INSERTION OF MESH;  Surgeon: Kieth Brightly Arta Bruce, MD;  Location: [facility];  Service: General;  Laterality: Right;   IR THORACENTESIS ASP PLEURAL SPACE W/IMG GUIDE  05/18/2021   IR THORACENTESIS ASP PLEURAL SPACE W/IMG GUIDE  06/07/2021   LEFT HEART CATH AND CORONARY ANGIOGRAPHY N/A 04/20/2021   Procedure: LEFT HEART CATH AND  CORONARY ANGIOGRAPHY;  Surgeon: Wellington Hampshire, MD;  Location: [facility];  Service: Cardiovascular;  Laterality: N/A;   TEE WITHOUT CARDIOVERSION N/A 05/08/2021   Procedure: TRANSESOPHAGEAL ECHOCARDIOGRAM (TEE);  Surgeon: Wonda Olds, MD;  Location: [place];  Service: Open Heart Surgery;  Laterality: N/A;   TONSILLECTOMY     TONSILLECTOMY     VIDEO BRONCHOSCOPY Bilateral 07/26/2015   Procedure: VIDEO BRONCHOSCOPY WITH FLUORO;  Surgeon: [name], MD;  [facility];  Service: Cardiopulmonary;  Laterality: Bilateral;   VIDEO BRONCHOSCOPY WITH ENDOBRONCHIAL NAVIGATION N/A 08/23/2015   Procedure: VIDEO BRONCHOSCOPY WITH ENDOBRONCHIAL NAVIGATION;  Surgeon: Grace Isaac, MD;  Location[facility];  Service: Thoracic;  Laterality: N/A;   VIDEO BRONCHOSCOPY WITH ENDOBRONCHIAL ULTRASOUND N/A 08/23/2015   Procedure: VIDEO BRONCHOSCOPY WITH ENDOBRONCHIAL ULTRASOUND;  Surgeon: Grace Isaac, MD;  Location: [facility];  Service: Thoracic;  Laterality: N/A;    Family History  Problem Relation Age of Onset   Leukemia Father    Emphysema Father    Learning disabilities Son    Atrial fibrillation Son    Leukemia Other    Stroke Other     Social History   Socioeconomic History   Marital status: Married    Spouse name: Not on file   Number of children: 2   Years of education: Not on file   Highest education level: Not on file  Occupational History   Occupation: Retired    Fish farm manager: [employer]    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[employer]  Tobacco Use   Smoking status: Former    Packs/day: 1.00    Years: 57.00    Total pack years: 57.00    Types: Cigarettes, Cigars    Quit date: 08/08/2015    Years since quitting: 6.7   Smokeless tobacco: Former    Types: Chew    Quit date: 11/04/1958   Tobacco comments:    Will smoke cigar every once in awhile.  Vaping daily started a couple weeks ago.  04/18/22 hfb  Vaping Use   Vaping Use: Former  Substance and Sexual  Activity   Alcohol use: Not Currently    Alcohol/week: 0.0 standard drinks of alcohol   Drug use: No   Sexual activity: Not Currently  Other Topics Concern   Not on file  Social History Narrative   Not on file   Social Determinants of Health   Financial Resource Strain: Medium Risk (11/22/2021)   Overall Financial Resource Strain (CARDIA)    Difficulty of Paying Living Expenses: Somewhat hard  Food Insecurity: No Food Insecurity (09/25/2021)   Hunger Vital Sign    Worried About Running Out of Food in the Last Year: Never true    Ran Out of Food in the Last Year:  Never true  Transportation Needs: No Transportation Needs (05/13/2022)   PRAPARE - Hydrologist (Medical): No    Lack of Transportation (Non-Medical): No  Physical Activity: Insufficiently Active (08/05/2021)   Exercise Vital Sign    Days of Exercise per Week: 2 days    Minutes of Exercise per Session: 40 min  Stress: No Stress Concern Present (10/01/2021)   Robert Bates    Feeling of Stress : Not at all  Social Connections: Moderately Isolated (05/13/2022)   Social Connection and Isolation Panel [NHANES]    Frequency of Communication with Friends and Family: Twice a week    Frequency of Social Gatherings with Friends and Family: Once a week    Attends Religious Services: Never    Marine scientist or Organizations: No    Attends Archivist Meetings: Never    Marital Status: Married  Human resources officer Violence: Not At Risk (10/01/2021)   Humiliation, Afraid, Rape, and Kick questionnaire    Fear of Current or Ex-Partner: No    Emotionally Abused: No    Physically Abused: No    Sexually Abused: No    Outpatient Medications Prior to Visit  Medication Sig Dispense Refill   acetaminophen (TYLENOL) 500 MG tablet Take 1-2 tablets (500-1,000 mg total) by mouth every 6 (six) hours as needed. 30 tablet 0   albuterol  (PROVENTIL) (2.5 MG/3ML) 0.083% nebulizer solution Take 3 mLs (2.5 mg total) by nebulization every 6 (six) hours as needed for wheezing or shortness of breath. 75 mL 12   albuterol (VENTOLIN HFA) 108 (90 Base) MCG/ACT inhaler Inhale 2 puffs into the lungs every 6 (six) hours as needed for wheezing or shortness of breath. 8.5 g 5   amiodarone (PACERONE) 200 MG tablet Take 1/2 tablet (100 mg total) by mouth daily. 45 tablet 3   atorvastatin (LIPITOR) 80 MG tablet Take 1 tablet (80 mg total) by mouth daily. 90 tablet 3   Budeson-Glycopyrrol-Formoterol (BREZTRI AEROSPHERE) 160-9-4.8 MCG/ACT AERO Inhale 2 puffs into the lungs in the morning and at bedtime. 10.7 g 5   furosemide (LASIX) 40 MG tablet Take 1 tablet (40 mg total) by mouth daily. 30 tablet 1   gabapentin (NEURONTIN) 300 MG capsule Take 1 capsule (300 mg total) by mouth 3 (three) times daily. (Patient taking differently: Take 300 mg by mouth 2 (two) times daily.) 90 capsule 1   Levetiracetam 750 MG TB24 Take 1 tablet (750 mg total) by mouth at bedtime. 30 tablet 11   Multiple Vitamin (MULTIVITAMIN WITH MINERALS) TABS tablet Take 1 tablet by mouth daily in the afternoon.     omeprazole (PRILOSEC) 40 MG capsule Take 1 capsule (40 mg total) by mouth daily. 90 capsule 1   potassium chloride (KLOR-CON) 10 MEQ tablet TAKE 2 TABLETS BY MOUTH DAILY FOR 2 DAYS THEN REDUCE TO 1 TABLET BY MOUTH DAILY 92 tablet 3   rivaroxaban (XARELTO) 20 MG TABS tablet TAKE 1 TABLET BY MOUTH DAILY WITH SUPPER (Patient taking differently: Take 20 mg by mouth daily with supper.) 30 tablet 5   tamsulosin (FLOMAX) 0.4 MG CAPS capsule TAKE 1 CAPSULE (0.4 MG TOTAL) BY MOUTH DAILY. 90 capsule 1   No facility-administered medications prior to visit.    Allergies  Allergen Reactions   Iodine Other (See Comments)    neck swells   Iohexol Swelling    Neck and gland swelling per patient.    ROS See  HPI    Objective:    Physical Exam Constitutional:      General: He  is not in acute distress.    Appearance: He is well-developed.  HENT:     Head: Normocephalic and atraumatic.  Cardiovascular:     Rate and Rhythm: Normal rate and regular rhythm.     Heart sounds: No murmur heard. Pulmonary:     Effort: Pulmonary effort is normal. No respiratory distress.     Breath sounds: Normal breath sounds. No wheezing or rales.  Skin:    General: Skin is warm and dry.  Neurological:     Mental Status: He is alert and oriented to person, place, and time.  Psychiatric:        Behavior: Behavior normal.        Thought Content: Thought content normal.     BP 125/67 (BP Location: Right Arm, Patient Position: Sitting, Cuff Size: Small)   Pulse 64   Temp 97.9 F (36.6 C) (Oral)   Resp 16   Wt 181 lb (82.1 kg)   SpO2 98%   BMI 26.73 kg/m  Wt Readings from Last 3 Encounters:  05/20/22 181 lb (82.1 kg)  05/07/22 178 lb 2.1 oz (80.8 kg)  04/19/22 181 lb (82.1 kg)       Assessment & Plan:   Problem List Items Addressed This Visit       Unprioritized   Essential hypertension (Chronic)   Relevant Orders   Basic metabolic panel   RENAL CYST, RIGHT    Felt to be hemorrhagic on MR abdomen 7/23.       Pleural effusion - Primary    Clinically stable today. Obtain follow up CXR.       Relevant Orders   DG Chest 2 View   Lung cancer [facility])    Patient is advised to keep his upcoming appointments with pulmonology and oncology.       Aortic dilatation ([place])    Plan 3 year follow up US for surveillance.       Other Visit Diagnoses     Anemia, unspecified type       Relevant Orders   CBC with Differential/Platelet   Renal cyst           I am having Fransisca Connors maintain his multivitamin with minerals, acetaminophen, albuterol, Breztri Aerosphere, albuterol, atorvastatin, rivaroxaban, potassium chloride, amiodarone, levETIRAcetam, furosemide, omeprazole, tamsulosin, and gabapentin.  No orders of the defined types were placed in this  encounter.

## 2022-05-20 NOTE — Assessment & Plan Note (Signed)
Felt to be hemorrhagic on MR abdomen 7/23.

## 2022-05-20 NOTE — Assessment & Plan Note (Signed)
Plan 3 year follow up US for surveillance.

## 2022-05-20 NOTE — Telephone Encounter (Signed)
Patient is scheduled 06/07/2022 at 2pm with Dr. Desai-reminder mailed- nothing further needed.

## 2022-05-20 NOTE — Assessment & Plan Note (Signed)
>>  ASSESSMENT AND PLAN FOR AORTIC ANEURYSM (HCC) WRITTEN ON 01/28/2024  1:14 PM BY O'SULLIVAN, Willistine Ferrall, NP   >>ASSESSMENT AND PLAN FOR AORTIC DILATATION (HCC) WRITTEN ON 05/20/2022  2:26 PM BY O'SULLIVAN, Lavaya Defreitas, NP  Plan 3 year follow up US for surveillance.

## 2022-05-21 ENCOUNTER — Ambulatory Visit (HOSPITAL_COMMUNITY)
Admission: RE | Admit: 2022-05-21 | Discharge: 2022-05-21 | Disposition: A | Discharge status: Home / Self Care | Payer: Medicare Other | Source: Ambulatory Visit | Attending: Student | Admitting: Student

## 2022-05-21 DIAGNOSIS — R911 Solitary pulmonary nodule: Secondary | ICD-10-CM | POA: Diagnosis not present

## 2022-05-21 DIAGNOSIS — J432 Centrilobular emphysema: Secondary | ICD-10-CM | POA: Diagnosis not present

## 2022-05-21 DIAGNOSIS — J910 Malignant pleural effusion: Secondary | ICD-10-CM | POA: Diagnosis not present

## 2022-05-21 DIAGNOSIS — I700 Atherosclerosis of aorta: Secondary | ICD-10-CM | POA: Diagnosis not present

## 2022-05-21 DIAGNOSIS — J90 Pleural effusion, not elsewhere classified: Secondary | ICD-10-CM | POA: Diagnosis not present

## 2022-05-30 ENCOUNTER — Encounter (HOSPITAL_COMMUNITY)
Admission: RE | Admit: 2022-05-30 | Discharge: 2022-05-30 | Disposition: A | Discharge status: Home / Self Care | Payer: Medicare Other | Source: Ambulatory Visit | Attending: Student | Admitting: Student

## 2022-05-30 DIAGNOSIS — D3500 Benign neoplasm of unspecified adrenal gland: Secondary | ICD-10-CM | POA: Diagnosis not present

## 2022-05-30 DIAGNOSIS — I7141 Pararenal abdominal aortic aneurysm, without rupture: Secondary | ICD-10-CM | POA: Insufficient documentation

## 2022-05-30 DIAGNOSIS — I700 Atherosclerosis of aorta: Secondary | ICD-10-CM | POA: Insufficient documentation

## 2022-05-30 DIAGNOSIS — R590 Localized enlarged lymph nodes: Secondary | ICD-10-CM | POA: Insufficient documentation

## 2022-05-30 DIAGNOSIS — Z923 Personal history of irradiation: Secondary | ICD-10-CM | POA: Diagnosis not present

## 2022-05-30 DIAGNOSIS — J910 Malignant pleural effusion: Secondary | ICD-10-CM | POA: Insufficient documentation

## 2022-05-30 DIAGNOSIS — Z85118 Personal history of other malignant neoplasm of bronchus and lung: Secondary | ICD-10-CM | POA: Insufficient documentation

## 2022-05-30 DIAGNOSIS — J90 Pleural effusion, not elsewhere classified: Secondary | ICD-10-CM | POA: Diagnosis not present

## 2022-05-30 LAB — GLUCOSE, CAPILLARY: Glucose-Capillary: 110 mg/dL — ABNORMAL HIGH (ref 70–99)

## 2022-05-30 MED ORDER — FLUDEOXYGLUCOSE F - 18 (FDG) INJECTION
9.1000 | Freq: Once | INTRAVENOUS | Status: AC
  Administered 2022-05-30: 9.03 via INTRAVENOUS

## 2022-06-03 ENCOUNTER — Other Ambulatory Visit (HOSPITAL_BASED_OUTPATIENT_CLINIC_OR_DEPARTMENT_OTHER): Payer: Self-pay

## 2022-06-03 DIAGNOSIS — E782 Mixed hyperlipidemia: Secondary | ICD-10-CM | POA: Diagnosis not present

## 2022-06-03 DIAGNOSIS — J449 Chronic obstructive pulmonary disease, unspecified: Secondary | ICD-10-CM

## 2022-06-03 DIAGNOSIS — I10 Essential (primary) hypertension: Secondary | ICD-10-CM

## 2022-06-03 DIAGNOSIS — I5032 Chronic diastolic (congestive) heart failure: Secondary | ICD-10-CM

## 2022-06-03 DIAGNOSIS — I4819 Other persistent atrial fibrillation: Secondary | ICD-10-CM

## 2022-06-03 DIAGNOSIS — I25110 Atherosclerotic heart disease of native coronary artery with unstable angina pectoris: Secondary | ICD-10-CM | POA: Diagnosis not present

## 2022-06-03 DIAGNOSIS — E1142 Type 2 diabetes mellitus with diabetic polyneuropathy: Secondary | ICD-10-CM

## 2022-06-05 ENCOUNTER — Telehealth: Payer: Self-pay | Admitting: Family

## 2022-06-05 NOTE — Telephone Encounter (Signed)
Patient called to ask why he needed to get another CT scan done and was told to call our office. Advised patient that Dr. Julien Nordmann ordered the additional CT scan and he should call their office to find out the reason behind the order. Patient understands.

## 2022-06-06 ENCOUNTER — Other Ambulatory Visit (HOSPITAL_BASED_OUTPATIENT_CLINIC_OR_DEPARTMENT_OTHER): Payer: Self-pay

## 2022-06-06 ENCOUNTER — Telehealth: Payer: Self-pay | Admitting: Student

## 2022-06-06 ENCOUNTER — Ambulatory Visit (INDEPENDENT_AMBULATORY_CARE_PROVIDER_SITE_OTHER): Payer: Medicare Other | Admitting: Pharmacist

## 2022-06-06 DIAGNOSIS — I4819 Other persistent atrial fibrillation: Secondary | ICD-10-CM

## 2022-06-06 DIAGNOSIS — R569 Unspecified convulsions: Secondary | ICD-10-CM

## 2022-06-06 DIAGNOSIS — I10 Essential (primary) hypertension: Secondary | ICD-10-CM

## 2022-06-06 DIAGNOSIS — I5032 Chronic diastolic (congestive) heart failure: Secondary | ICD-10-CM

## 2022-06-06 NOTE — Telephone Encounter (Signed)
[name] states he already had this procedure done. Floy Sabina that schedules the combo cases explained to him this was a PET procedure with a CT super D, which is different. He said he would like a call back from a nurse or the doctor to find out if he really needs to do this procedure.

## 2022-06-06 NOTE — Patient Instructions (Signed)
[name] I was so glad to speak with you today.  Below is a summary of your health goals and our recent visit. You can also view your updated Chronic Care Management Care plan through your MyChart account.    Patient Goals/Self-Care Activities  take medications as prescribed and collaborate with provider on medication access solutions Restart checking weight daily - report weight gain of more than 2 to 3 lbs in 24 hours or 5 lbs in 1 week; Contact office if you have worsening abdominal fullness or any of these other symptoms. Weight gain, shortness of breath, abdominal fullness, swelling in legs or abdomen, Fatigue and weakness, changes in ability to perform usual activities, persistent cough or wheezing with white or pink blood-tinged mucus, nausea and lack of appetite Take Breztri - inhaler 2 puffs into lungs twice a day. Remember to rinse mouth after each use.  (Maintenance inhaler - should be taken EVERY day)  Albuterol inhaler or nebulizer solution is your rescue medications - it can be used when you are wheezing. Contact office if you are using albuterol more than 2 times per week.  Take Xarelto once a day with evening meal - improves absorption.  Also take last dose of gabapentin at bedtime and take levetiracetam 750mg  at bedtime.    As always if you have any questions or concerns especially regarding medications, please feel free to contact me either at the phone number below or with a MyChart message.   Keep up the good work!  Cherre Robins, PharmD Clinical Pharmacist [place] [phone] (direct line)  [phone] (main office number)   The patient verbalized understanding of instructions, educational materials, and care plan provided today and agreed to receive a mailed copy of patient instructions, educational materials, and care plan.

## 2022-06-06 NOTE — Telephone Encounter (Signed)
He does not need any new imaging - he has already done all the imaging needed prior to his appointment with Dr. Shearon Stalls.

## 2022-06-06 NOTE — Chronic Care Management (AMB) (Signed)
Chronic Care Management Pharmacy Note  06/06/2022 Name:  Robert Bates MRN:  [id number] DOB:  1941-07-27  Summary: Reviewed adherence - patient is due to refill Xarelto and levetiracetam soon and plans to request refills today.  He mentions that cost of Xarelto has increased. Unfortunately for [place] [facility] [place] does not have a patient assistance program for Medicare recipients. They do offer [employer] program where patient can purchase Xarelto thru their mail order pharmacy for $85/month or $240 / 3 months. Provided patient with information to enroll if he is interested.  Reminded to take furosemide 40mg  daily and to check weight each morning. Patient will report weight gain of more than 3 lbs in 24 hours or 5 lbs in 1 week. Contact office if you have worsening abdominal fullness or any of these other symptoms. Weight gain, shortness of breath, abdominal fullness, swelling in legs or abdomen, Fatigue and weakness, changes in ability to perform usual activities, persistent cough or wheezing with white or pink blood-tinged mucus, nausea and lack of appetite  Reminded patient of appointment with pulmonology tomorrow 06/07/2022   Subjective: Robert Bates is an 81 y.o. year old male who is a primary patient of [name], NP.  The CCM team was consulted for assistance with disease management and care coordination needs.    Engaged with patient by telephone for  follow up  in response to provider referral for pharmacy case management and/or care coordination services.   Consent to Services:  The patient was given information about Chronic Care Management services, agreed to services, and gave verbal consent prior to initiation of services.  Please see initial visit note for detailed documentation.   Patient Care Team: [name], NP as PCP - General Nahser, Wonda Cheng, MD as PCP - Cardiology (Cardiology) Elsie Stain, MD as Attending Physician  (Pulmonary Disease) Virgina Evener, [place] (Optometry) Cherre Robins, RPH-CPP (Pharmacist) Sharmon Revere as Physician Assistant (Cardiology) Spero Geralds, MD as Consulting Physician (Pulmonary Disease)  Recent office visits: 05/20/2022 - Int Med Inda Castle, NP) Hospital follow up. Ordered chest xray to check pleural effusion. Checked BMET and CBC. However looks like labs and xray were not completed. No med changes noted.  04/19/2022 - Int Med Inda Castle, NP) F/U chronic conditions. Continues to have neck and back pain. He is also concerned with increase in weight. No med changes notes.  03/15/2022 - Int Med Inda Castle, NP) Seen for increased urinary frequency.  Referred to neurology for f/u seizures. Noted to have microscopic hematuria - referred back to urology.  01/15/2022 - Int Med Inda Castle) Seen for right groin pain, neuropathy and left eye vision changes. Referred to general surgeon for consultation for inguinal hernia. Started gabapentin 300mg  once daily for neuropathy 10/12/2021 - Int Med Inda Castle NP) F/U COPD exacerbation. Referred to pulmonology and cardiology  Recent consult visits: 04/18/2022 - Pulm (Dr Shearon Stalls) F/U COPD. No med changes noted. 04/12/2022 - Neurology (Dr April Manson) Seen for seizures. Restarted Keppra XR 750mg  daily. F/U 5 months.  04/05/2022 - Cardio Kathlen Mody, [facility]) F/U CHF and afib. Noted volume excess. Increased furosemide to 40mg  daily for 2 days (also take 53mEq for 3 days) (if edema continues could consider SGLT2) Lowered dose of amiodarone to 100mg  daily  01/11/2022 - Pulmonology (Dr Shearon Stalls) Prescribed prednisone 40mg  daily for 5 days.    Hospital visits: None in last 6 months   Objective:  Lab Results  Component Value Date   CREATININE 0.86 05/07/2022   CREATININE  0.90 05/06/2022   CREATININE 0.98 04/19/2022    Lab Results  Component Value Date   HGBA1C 6.4 04/19/2022   Last diabetic Eye exam:  Lab Results  Component Value Date/Time    HMDIABEYEEXA No Retinopathy 06/16/2021 01:36 PM    Last diabetic Foot exam: No results found for: "HMDIABFOOTEX"      Component Value Date/Time   CHOL 140 07/13/2021 1336   TRIG 72.0 07/13/2021 1336   HDL 67.00 07/13/2021 1336   CHOLHDL 2 07/13/2021 1336   VLDL 14.4 07/13/2021 1336   LDLCALC 58 07/13/2021 1336   LDLCALC 62 04/11/2021 1421   LDLDIRECT 142.2 12/26/2008 1032       Latest Ref Rng & Units 05/07/2022    4:47 AM 04/05/2022   11:15 AM 06/14/2021    3:13 PM  Hepatic Function  Total Protein 6.5 - 8.1 g/dL 7.2  6.0  5.9   Albumin 3.5 - 5.0 g/dL 3.8  3.9  2.8   AST 15 - 41 U/L $Remo'19  9  11   'Viodk$ ALT 0 - 44 U/L $Remo'16  12  13   'QdTha$ Alk Phosphatase 38 - 126 U/L 92  102  103   Total Bilirubin 0.3 - 1.2 mg/dL 0.6  0.3  0.4     Lab Results  Component Value Date/Time   TSH 3.620 04/05/2022 11:15 AM   TSH 4.26 08/03/2021 08:25 AM       Latest Ref Rng & Units 05/07/2022    4:47 AM 05/06/2022    1:23 PM 06/14/2021    3:13 PM  CBC  WBC 4.0 - 10.5 K/uL 8.0  7.2  6.8   Hemoglobin 13.0 - 17.0 g/dL 12.7  11.6  10.4   Hematocrit 39.0 - 52.0 % 41.3  38.0  32.4   Platelets 150 - 400 K/uL 282  227  247     No results found for: "VD25OH"  Clinical ASCVD: Yes  The ASCVD Risk score (Arnett DK, et al., 2019) failed to calculate for the following reasons:   The 2019 ASCVD risk score is only valid for ages 85 to 56    Other: CHADS2VASc = 5   Social History   Tobacco Use  Smoking Status Former   Packs/day: 1.00   Years: 57.00   Total pack years: 57.00   Types: Cigarettes, Cigars   Quit date: 08/08/2015   Years since quitting: 6.8  Smokeless Tobacco Former   Types: Chew   Quit date: 11/04/1958  Tobacco Comments   Will smoke cigar every once in awhile.  Vaping daily started a couple weeks ago.  04/18/22 hfb   BP Readings from Last 3 Encounters:  05/20/22 125/67  05/08/22 123/89  04/19/22 127/65   Pulse Readings from Last 3 Encounters:  05/20/22 64  05/08/22 88  04/19/22 (!) 58   Wt  Readings from Last 3 Encounters:  05/20/22 181 lb (82.1 kg)  05/07/22 178 lb 2.1 oz (80.8 kg)  04/19/22 181 lb (82.1 kg)    Assessment: Review of patient past medical history, allergies, medications, health status, including review of consultants reports, laboratory and other test data, was performed as part of comprehensive evaluation and provision of chronic care management services.   SDOH:  (Social Determinants of Health) assessments and interventions performed:      CCM Care Plan  Allergies  Allergen Reactions   Iodine Other (See Comments)    neck swells   Iohexol Swelling    Neck and gland swelling per patient.  Medications Reviewed Today     Reviewed by Cherre Robins, RPH-CPP (Pharmacist) on 06/06/22 at 1142  Med List Status: <None>   Medication Order Taking? Sig Documenting Provider Last Dose Status Informant  acetaminophen (TYLENOL) 500 MG tablet [id number] Yes Take 1-2 tablets (500-1,000 mg total) by mouth every 6 (six) hours as needed. Nani Skillern, PA-C Taking Active Multiple Informants, Self, Pharmacy Records  albuterol (PROVENTIL) (2.5 MG/3ML) 0.083% nebulizer solution [id number] Yes Take 3 mLs (2.5 mg total) by nebulization every 6 (six) hours as needed for wheezing or shortness of breath. Spero Geralds, MD Taking Active Multiple Informants, Self, Pharmacy Records           Med Note [facility], [place] B   Thu Jun 06, 2022 11:35 AM)    albuterol (VENTOLIN HFA) 108 (90 Base) MCG/ACT inhaler [id number] Yes Inhale 2 puffs into the lungs every 6 (six) hours as needed for wheezing or shortness of breath. [name], NP Taking Active Multiple Informants, Self, Pharmacy Records           Med Note [place], [place] B   Thu Jun 06, 2022 11:36 AM)    amiodarone (PACERONE) 200 MG tablet [id number] Yes Take 1/2 tablet (100 mg total) by mouth daily. Liliane Shi, PA-C Taking Active Multiple Informants, Self, Pharmacy Records  atorvastatin (LIPITOR) 80 MG tablet  [id number] Yes Take 1 tablet (80 mg total) by mouth daily. Nahser, Wonda Cheng, MD Taking Active Multiple Informants, Self, Pharmacy Records  Budeson-Glycopyrrol-Formoterol [facility] AEROSPHERE) 160-9-4.8 MCG/ACT AERO [id number] Yes Inhale 2 puffs into the lungs in the morning and at bedtime. Spero Geralds, MD Taking Active Multiple Informants, Self, Pharmacy Records           Med Note [facility], [place] B   Mon May 13, 2022 11:45 AM)    furosemide (LASIX) 40 MG tablet [id number] Yes Take 1 tablet (40 mg total) by mouth daily. Nita Sells, MD Taking Active   gabapentin (NEURONTIN) 300 MG capsule [id number] Yes Take 1 capsule (300 mg total) by mouth 3 (three) times daily.  Patient taking differently: Take 300 mg by mouth 2 (two) times daily.   [name], NP Taking Active   Levetiracetam 750 MG TB24 [id number] Yes Take 1 tablet (750 mg total) by mouth at bedtime. Alric Ran, MD Taking Active Multiple Informants, Self, Pharmacy Records    Discontinued 05/08/21 1058 Multiple Vitamin (MULTIVITAMIN WITH MINERALS) TABS tablet [id number] Yes Take 1 tablet by mouth daily in the afternoon. [provider] Taking Active Multiple Informants, Self, Pharmacy Records           Med Note [facility] Cont[facility], [place] B   Mon May 13, 2022 11:57 AM)    omeprazole (PRILOSEC) 40 MG capsule [id number] Yes Take 1 capsule (40 mg total) by mouth daily. [name], NP Taking Active   potassium chloride (KLOR-CON) 10 MEQ tablet [id number] Yes TAKE 2 TABLETS BY MOUTH DAILY FOR 2 DAYS THEN REDUCE TO 1 TABLET BY MOUTH DAILY Richardson Dopp T, PA-C Taking Active Multiple Informants, Self, Pharmacy Records  rivaroxaban (XARELTO) 20 MG TABS tablet [id number] Yes TAKE 1 TABLET BY MOUTH DAILY WITH SUPPER  Patient taking differently: Take 20 mg by mouth daily with supper.   Nahser, Wonda Cheng, MD Taking Active Multiple Informants, Self, Pharmacy Records           Med Note Barbaraann Boys May 13, 2022 11:58 AM)     tamsulosin (FLOMAX) 0.4 MG CAPS capsule [id number] Yes TAKE 1  CAPSULE (0.4 MG TOTAL) BY MOUTH DAILY. [name], NP Taking Active             Patient Active Problem List   Diagnosis Date Noted   Pleural effusion 05/20/2022   Aortic dilatation ([place]) 05/13/2022   Dyspnea on exertion 05/06/2022   Thoracic aortic aneurysm ([place]) 04/04/2022   Lower urinary tract symptoms (LUTS) 03/15/2022   Lumbar radiculopathy 03/15/2022   Chronic heart failure with preserved ejection fraction ([place]) 03/15/2022   Diabetic peripheral neuropathy ([place]) 01/15/2022   Unilateral inguinal hernia without obstruction or gangrene 01/15/2022   COPD exacerbation ([place]) 10/03/2021   Persistent atrial fibrillation ([place]) 05/28/2021   Secondary hypercoagulable state ([place]) 05/28/2021   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   Coronary artery disease 05/08/2021   COVID-19 virus infection 04/23/2021   Erectile dysfunction 04/11/2021   Head trauma 03/07/2021   Left leg pain 04/07/2020   Benign prostatic hyperplasia with nocturia 03/08/2020   Seizures ([place]) 02/18/2020   PAF (paroxysmal atrial fibrillation) ([place]) 08/15/2017   Diabetes type 2, controlled ([place]) 07/31/2017   COPD GOLD II  04/03/2017   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[place]) 09/06/2015   Lung cancer ([place]) 11/07/2014   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   Essential hypertension 04/29/2014   Meningioma ([place]) 10/25/2013   Low back pain 10/25/2013   Osteoarthritis 08/18/2012   Atypical chest pain 08/14/2011   KERATOSIS 10/09/2010   SCIATICA, RIGHT 10/09/2010   UNSPECIFIED HEARING LOSS 05/21/2010   Hyperlipidemia LDL goal <70 05/14/2010   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   RENAL CYST, RIGHT 02/05/2010   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[place]) 01/29/2010   LIPOMA 11/17/2009   MICROSCOPIC HEMATURIA 08/11/2008   GERD 07/26/2008   DERMOID CYST 07/25/2008    Immunization History  Administered Date(s) Administered   Fluad Quad(high Dose 65+)  07/23/2019, 09/04/2020, 07/13/2021   Influenza Split 08/02/2011, 08/18/2012   Influenza Whole 08/17/2008   Influenza, High Dose Seasonal PF 08/09/2015, 09/30/2016, 07/30/2017, 08/07/2018   Influenza,inj,Quad PF,6+ Mos 07/27/2013, 08/09/2014   PFIZER Comirnaty(Gray Top)Covid-19 Tri-Sucrose Vaccine 02/16/2021   PFIZER(Purple Top)SARS-COV-2 Vaccination 01/28/2020, 02/21/2020, 09/04/2020   Pfizer Covid-19 Vaccine Bivalent Booster 25yrs & up 08/03/2021   Pneumococcal Conjugate-13 08/09/2014   Pneumococcal Polysaccharide-23 05/14/2010   Td 07/25/2008    Conditions to be addressed/monitored: Atrial Fibrillation, CHF, HTN, HLD, COPD, and Gout  Care Plan : General Pharmacy (Adult)  Updates made by Cherre Robins, RPH-CPP since 06/06/2022 12:00 AM     Problem: Management of chronic medicaiton conditions and medication: Atrial Fibrillation, HTN, HLD, type 2 DM, Pulmonary Disease and BPH; seizures; h/o lung cancer and meningioma; GERD; aortic aneurysm   Priority: High  Onset Date: 02/08/2021  Note:   Current Barriers:  Does not adhere to prescribed medication regimen Management of chronic medical conditions and medication management Unable to afford medication therapy - improved Education needed regarding chronic conditions and self management  Pharmacist Clinical Goal(s):  Over the next 180 days, patient will achieve adherence to monitoring guidelines and medication adherence to achieve therapeutic efficacy contact provider office for questions/concerns as evidenced notation of same in electronic health record through collaboration with PharmD and provider.  Provide education regarding checking weight and monitoring for signs and symptoms of fluid retention.  Provide education regarding proper inhaler use - maintenance versus rescue.   Interventions: 1:1 collaboration with [name], NP regarding development and update of comprehensive plan of care as evidenced by provider attestation  and co-signature Inter-disciplinary care team collaboration (see longitudinal plan  of care) Comprehensive medication review performed; medication list updated in electronic medical record  Diabetes: Controlled; A1c goal < 7.0% Lab Results  Component Value Date   HGBA1C 6.4 04/19/2022  Current treatment:  none Current glucose readings: does not check BG at home Current meal patterns: eating a little better since CABG 05/2021. Reports he has difficulty resisting cherry pie Patient reports he has seen optometrist 3 months ago but he does not feel that he can see as well with his new glasses.  Noted to have neuropathy at last visit. Gabapentin started 300mg  3 times a day. Patient states he is mostly taking 2 times a day. Denies drowsiness. Reports improved nerve related pain.  Interventions:   Educated on limiting foods containing carbohydrates and sugar; recommended increase non starchy vegetables Counseled on A1c goal.  Patient encouraged to follow up with optometrist regarding vision and new glasses Continue gabapentin 300mg  2 to 3 times a day - warned patient not to drive if drowsy.  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 Also has not been taking potassium supplement 11/29/2021 - he reported that he is taking furosemide some day when he is not working. This past week took Saturday, Sunday and Monday night. Reports that breathing and abdominal fullness has improved. He reports his weight is down about 9 lbs. Per patient (last week 180 lbs and today 171lbs)   Recent  hospitaliztion for plural effusion 07/03 to 05/08/2022. Patient states he is taking furosemide daily since he left hospital 05/08/2022 Patient checks BP at home infrequently - no readings provided EF was 60 to 65% 04/2021 Denies hypotensive/hypertensive symptoms Reports occasional LEE (greater in evening)  Has not been checking weight regularly at home.  Interventions:  Educated on  blood pressure goals Continue to take furosemide and potassium  Contact office if you have worsening abdominal fullness or any of these other symptoms. Weight gain, shortness of breath, abdominal fullness, swelling in legs or abdomen, Fatigue and weakness, changes in ability to perform usual activities, persistent cough or wheezing with white or pink blood-tinged mucus, nausea and lack of appetite Restart checking weight daily - report weight gain of more than 2 to 3 lbs in 24 hours or 5 lbs in 1 week Discussed low sodium diet.    Hyperlipidemia / CAD (LDL < 70) Controlled Lab Results  Component Value Date   CHOL 140 07/13/2021   HDL 67.00 07/13/2021   LDLCALC 58 07/13/2021   LDLDIRECT 142.2 12/26/2008   TRIG 72.0 07/13/2021   CHOLHDL 2 07/13/2021  CABG x 3 vessels 05/08/2021.   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 Recommended try to increase intake of non starchy vegetables Recommended continue current therapy.  Consider recheck lipids with next labs  Chronic Obstructive Pulmonary Disease: Controlled COPD monitored by Dr Shearon Stalls - last visit 01/11/2022 Current treatment:  albuterol inhaler or nebulizer solution  use as needed for shortness of breath or wheezing (rescue inhaler) Breztri - inhaler two puffs into lungs twice a  day  [place] patient assistance program approved 11/13/2021. Patient reports he has received third shipment of Breztri.  Patient confirms he did receive nebulizer but not sure that he wants to keep it since he wife also has one he can use.  GOLD Classification: 2 (FEV1 50-79%) Most recent Pulmonary Function Testing: 05/19/2017 - spirometry FVC 2.84 (73%) FEV1 1.74 (62%) FEV1/FVC 61% Current COPD Classification:  B (CATscore >10, <2 exacerbations/yr)  Has not needed albuterol inhaler or nebulizer in the last 2 weeks Interventions:  Education provided about inhaler technique for [employer]. Reminded patient of instructions to use 2 puffs twice a day and to remember to rinse mouth after each use.  Education provided about rescue and maintenance inhaler Take Breztri - inhaler 2 puffs into lungs twice a day. Remember to rinse mouth after each use.  (Maintenance inhaler - should be taken EVERY day)  Albuterol inhaler or nebulizer solution is your rescue medications - it can be used when you are wheezing. Contact office if you are using albuterol more than 2 times per week.    Atrial Fibrillation: NSR since last hospitalization but did recently change from diltiazem to amiodarone due to being in afib prior to cardiac cath Current rate/rhythm controller:  amiodarone 200mg  daily Anticoagulant treatment:  Xarelto 20mg  daily with evening meal CHADS2VASc score: 5  Received patient assistance program for Xarelto in 2022 but [facility] [place] does not have patient assistance program program for 2023. Provided information for [employer] which could lower monthly Xarelto cost to $85 (using their mail order pharmacy) Interventions: (addressed at previous visit) Recommended continue current regimen Patient to have LFTs and TSH monitored by cardiologist regularly due to amiodarone therapy Discussed patient assistance program for xarelto - cannot apply until reaches coverage gap and spends 4% of income out  of pocket.  Performed medication cost assessment through [employer]; Patient will likely reach Medicare coverage gap in August 2023.  Take Xarelto once a day with evening meal - improves absorption.   GERD:  Patient is currently controlled on the following medications:  Omeprazole 40mg  daily Patient has failed these meds in past: None noted  Triggers: laying down after eating Interventions: (addressed at previous visit)  Recommend continue current medications     Patient Goals/Self-Care Activities Over the next 90 days, patient will:  take medications as prescribed and collaborate with provider on medication access solutions Restart checking weight daily - report weight gain of more than 2 to 3 lbs in 24 hours or 5 lbs in 1 week; Contact office if you have worsening abdominal fullness or any of these other symptoms. Weight gain, shortness of breath, abdominal fullness, swelling in legs or abdomen, Fatigue and weakness, changes in ability to perform usual activities, persistent cough or wheezing with white or pink blood-tinged mucus, nausea and lack of appetite Take Breztri - inhaler 2 puffs into lungs twice a day. Remember to rinse mouth after each use.  (Maintenance inhaler - should be taken EVERY day)  Albuterol inhaler or nebulizer solution is your rescue medications - it can be used when you are wheezing. Contact office if you are using albuterol more than 2 times per week.  Take Xarelto once a day with evening meal - improves absorption.  Also take last dose of gabapentin at bedtime and take levetiracetam 750mg  at bedtime.   Follow Up Plan: Telephone follow up  appointment with care management team member scheduled for:  Clinical Pharmacist Practitioner in 6 to 8 weeks                   Medication Assistance:  Breztri obtained through [facility] and Me medication assistance program.  Enrollment ends 11/03/2022  .  Usually hit coverage gap with Xarelto but they do not have medication  assistance program for Medicare patients for 2023. Patient has not hit coverage gap yet.    Patient's preferred pharmacy is:  [facility] [address], [place] [address] Phone: [phone] Fax: [phone]   Follow Up:  Patient agrees to Care Plan and Follow-up.  Plan: Telephone follow up appointment with care management team member scheduled for:   1 month clinical pharmacist.  .   Cherre Robins, PharmD Clinical Pharmacist [place] [facility]

## 2022-06-06 NOTE — Telephone Encounter (Signed)
Called and spoke to patient and relayed the information from Dr Verlee Monte that he does not need any new imaging done before he sees Dr Shearon Stalls on 06/14/22. Nothing further needed

## 2022-06-06 NOTE — Telephone Encounter (Signed)
Dr. Verlee Monte, please advise if pt still needs to get these procedures done. Looks like pt had a Super D CT on 7/18 and then pt had a PET on 7/27.  I see where orders had been placed for the PET/Super D CT and also an order had been placed for the Initial PET.

## 2022-06-07 ENCOUNTER — Telehealth: Payer: Self-pay | Admitting: Family

## 2022-06-07 ENCOUNTER — Ambulatory Visit: Payer: Medicare Other | Admitting: Internal Medicine

## 2022-06-07 DIAGNOSIS — R251 Tremor, unspecified: Secondary | ICD-10-CM

## 2022-06-07 NOTE — Telephone Encounter (Signed)
I would recommend referral to neurology for further evaluation. Referral placed.

## 2022-06-07 NOTE — Telephone Encounter (Signed)
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 He would like to know if Melissa could order something to help him. He would like a call back today to know if something will be sent. Please advise.

## 2022-06-10 NOTE — Telephone Encounter (Signed)
Patient advised of provider's comments and referral

## 2022-06-12 ENCOUNTER — Telehealth: Payer: Self-pay | Admitting: Pharmacist

## 2022-06-12 NOTE — Telephone Encounter (Signed)
Patient called to ask about [facility] [employer] program to get Xarelto for $85 per 30 days or $240 per 90 days.  Provided number to [employer] [phone]

## 2022-06-14 ENCOUNTER — Ambulatory Visit (HOSPITAL_COMMUNITY)
Admission: RE | Admit: 2022-06-14 | Discharge: 2022-06-14 | Disposition: A | Discharge status: Home / Self Care | Payer: Medicare Other | Source: Ambulatory Visit | Attending: Internal Medicine | Admitting: Internal Medicine

## 2022-06-14 ENCOUNTER — Encounter: Payer: Self-pay | Admitting: Internal Medicine

## 2022-06-14 ENCOUNTER — Other Ambulatory Visit: Payer: Self-pay

## 2022-06-14 ENCOUNTER — Ambulatory Visit: Payer: Medicare Other | Admitting: Internal Medicine

## 2022-06-14 ENCOUNTER — Inpatient Hospital Stay: Payer: Medicare Other | Attending: Internal Medicine

## 2022-06-14 VITALS — BP 120/70 | HR 61 | Ht 67.0 in | Wt 183.4 lb

## 2022-06-14 DIAGNOSIS — J90 Pleural effusion, not elsewhere classified: Secondary | ICD-10-CM

## 2022-06-14 DIAGNOSIS — I10 Essential (primary) hypertension: Secondary | ICD-10-CM

## 2022-06-14 DIAGNOSIS — R911 Solitary pulmonary nodule: Secondary | ICD-10-CM | POA: Diagnosis not present

## 2022-06-14 DIAGNOSIS — Z85118 Personal history of other malignant neoplasm of bronchus and lung: Secondary | ICD-10-CM | POA: Insufficient documentation

## 2022-06-14 DIAGNOSIS — I5032 Chronic diastolic (congestive) heart failure: Secondary | ICD-10-CM

## 2022-06-14 DIAGNOSIS — I2510 Atherosclerotic heart disease of native coronary artery without angina pectoris: Secondary | ICD-10-CM

## 2022-06-14 DIAGNOSIS — R918 Other nonspecific abnormal finding of lung field: Secondary | ICD-10-CM | POA: Diagnosis not present

## 2022-06-14 DIAGNOSIS — C3490 Malignant neoplasm of unspecified part of unspecified bronchus or lung: Secondary | ICD-10-CM

## 2022-06-14 DIAGNOSIS — I4819 Other persistent atrial fibrillation: Secondary | ICD-10-CM

## 2022-06-14 DIAGNOSIS — E782 Mixed hyperlipidemia: Secondary | ICD-10-CM

## 2022-06-14 DIAGNOSIS — J439 Emphysema, unspecified: Secondary | ICD-10-CM | POA: Diagnosis not present

## 2022-06-14 DIAGNOSIS — I712 Thoracic aortic aneurysm, without rupture, unspecified: Secondary | ICD-10-CM

## 2022-06-14 DIAGNOSIS — Z08 Encounter for follow-up examination after completed treatment for malignant neoplasm: Secondary | ICD-10-CM | POA: Insufficient documentation

## 2022-06-14 LAB — CMP (CANCER CENTER ONLY)
ALT: 15 U/L (ref 0–44)
AST: 13 U/L — ABNORMAL LOW (ref 15–41)
Albumin: 4 g/dL (ref 3.5–5.0)
Alkaline Phosphatase: 93 U/L (ref 38–126)
Anion gap: 6 (ref 5–15)
BUN: 20 mg/dL (ref 8–23)
CO2: 32 mmol/L (ref 22–32)
Calcium: 9.3 mg/dL (ref 8.9–10.3)
Chloride: 105 mmol/L (ref 98–111)
Creatinine: 0.96 mg/dL (ref 0.61–1.24)
GFR, Estimated: >60 mL/min (ref >60)
Glucose, Bld: 101 mg/dL — ABNORMAL HIGH (ref 70–99)
Potassium: 4.3 mmol/L (ref 3.5–5.1)
Sodium: 143 mmol/L (ref 135–145)
Total Bilirubin: 0.4 mg/dL (ref 0.3–1.2)
Total Protein: 6.6 g/dL (ref 6.5–8.1)

## 2022-06-14 LAB — CBC WITH DIFFERENTIAL (CANCER CENTER ONLY)
Abs Immature Granulocytes: 0.02 10*3/uL (ref 0.00–0.07)
Basophils Absolute: 0 10*3/uL (ref 0.0–0.1)
Basophils Relative: 1 %
Eosinophils Absolute: 0.2 10*3/uL (ref 0.0–0.5)
Eosinophils Relative: 3 %
HCT: 38.1 % — ABNORMAL LOW (ref 39.0–52.0)
Hemoglobin: 12 g/dL — ABNORMAL LOW (ref 13.0–17.0)
Immature Granulocytes: 0 %
Lymphocytes Relative: 16 %
Lymphs Abs: 1.1 10*3/uL (ref 0.7–4.0)
MCH: 27.6 pg (ref 26.0–34.0)
MCHC: 31.5 g/dL (ref 30.0–36.0)
MCV: 87.6 fL (ref 80.0–100.0)
Monocytes Absolute: 0.7 10*3/uL (ref 0.1–1.0)
Monocytes Relative: 11 %
Neutro Abs: 4.9 10*3/uL (ref 1.7–7.7)
Neutrophils Relative %: 69 %
Platelet Count: 283 10*3/uL (ref 150–400)
RBC: 4.35 MIL/uL (ref 4.22–5.81)
RDW: 15.9 % — ABNORMAL HIGH (ref 11.5–15.5)
WBC Count: 7 10*3/uL (ref 4.0–10.5)
nRBC: 0 % (ref 0.0–0.2)

## 2022-06-14 NOTE — Patient Instructions (Addendum)
Please schedule follow up scheduled with myself in 3 months.  If my schedule is not open yet, we will contact you with a reminder closer to that time. Please call [phone] if you haven't heard from [place] a month before.   Please come early to next appointment to get a chest xray done. To follow up on the fluid.   No evidence of cancer recurrence at this time. Let me know if your breathing is getting worse or you feel like fluid is building back up on your lungs. We can drain it again. There is a very small amount today that is not enough to drain.   Continue Breztri 2 puffs twice a day.   Have a happy birthday next week!  Be sure to follow up with Dr. Julien Nordmann.

## 2022-06-14 NOTE — Progress Notes (Signed)
Robert Bates    [id number]    24-Jul-1941  Primary Care Physician:O'Sullivan, Lenna Sciara, NP Date of Appointment: 06/14/2022 Established Patient Visit  Chief complaint:   Chief Complaint  Patient presents with   Follow-up    Follow-up     HPI: Robert Bates is a 81 y.o.  man with COPD Gold stage 2 FEV1 62% of predicted, lung cancer s/p curative radiation in 2016 and CAD s/p CABG.   Interval Updates:  He had an ED visit and hospital stay for worsening shortness of breath which shows left sided pleural effusion. Underwent thoracentesis which showed a couple atypical cells concerning for malignancy. Has had CT Chest and PET scan which do not show any recurrence of cancer.    Still taking breztri inhaler 2 puffs   Still driving trucks.   No fevers chills night sweats or weight loss. No cough. Has occasional pain with deep inspiration since his CABG.    I have reviewed the patient's family social and past medical history and updated as appropriate.   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   COPD (chronic obstructive pulmonary disease) (HCC)    Coronary artery disease    Diabetes type 2, controlled ([place]) 07/31/2017   Dyspnea    Dysrhythmia    a fib   GERD (gastroesophageal reflux disease)    Hematuria    refuses work up or referral - understands risks of morbidity / mortality - 11/2008, 12/2008   History of hiatal hernia    History of kidney stones    Hyperlipemia    Meningioma ([place]) 10/25/2013   Follows with Dr. [name].    Peripheral vascular disease ([place])    Abdominal Aortic Aneursym   Pneumonia    as a child   Radiation 09/18/15-10/25/15   left lower lobe 70.2 Gy   Seizures ([place]) 02/18/2020    Tobacco abuse     Past Surgical History:  Procedure Laterality Date   CHOLECYSTECTOMY N/A 07/23/2017   Procedure: LAPAROSCOPIC CHOLECYSTECTOMY;  Surgeon: Mickeal Skinner, MD;  Location: [facility];  Service: General;  Laterality: N/A;   CLIPPING OF ATRIAL APPENDAGE Left 05/08/2021   Procedure: CLIPPING OF ATRIAL APPENDAGE USING 84 ATRICLIP;  Surgeon: Wonda Olds, MD;  Location: [facility];  Service: Open Heart Surgery;  Laterality: Left;   COLONOSCOPY     CORONARY ARTERY BYPASS GRAFT N/A 05/08/2021   Procedure: CORONARY ARTERY BYPASS GRAFTING (CABG)X 3 USING LEFT INTERNAL MAMMARY ARTERY AND RIGHT GREATER SAPEHNOUS VEIN;  Surgeon: Wonda Olds, MD;  Location: [facility];  Service: Open Heart Surgery;  Laterality: N/A;   ENDOVEIN HARVEST OF GREATER SAPHENOUS VEIN Right 05/08/2021   Procedure: ENDOVEIN HARVEST OF GREATER SAPHENOUS VEIN;  Surgeon: Wonda Olds, MD;  Location: [facility];  Service: Open Heart Surgery;  Laterality: Right;   EYE SURGERY Bilateral    Cataracts removed w/ lens implant   HERNIA REPAIR     Left 36 years ago . Right inguinal hernia repair 10-01-17 Dr. Kieth Brightly   INGUINAL HERNIA REPAIR Right 10/01/2017   Procedure: RIGHT INGUINAL HERNIA REPAIR WITH MESH;  Surgeon: Kinsinger, Arta Bruce, MD;  Location: [facility];  Service:  General;  Laterality: Right;  TAP BLOCK   INSERTION OF MESH Right 10/01/2017   Procedure: INSERTION OF MESH;  Surgeon: Kinsinger, Arta Bruce, MD;  Location: [facility];  Service: General;  Laterality: Right;   IR THORACENTESIS ASP PLEURAL SPACE W/IMG GUIDE  05/18/2021   IR THORACENTESIS ASP PLEURAL SPACE W/IMG GUIDE  06/07/2021   LEFT HEART CATH AND CORONARY ANGIOGRAPHY N/A 04/20/2021   Procedure: LEFT HEART CATH AND CORONARY ANGIOGRAPHY;  Surgeon: Wellington Hampshire, MD;  Location: [facility];  Service: Cardiovascular;  Laterality: N/A;   TEE WITHOUT CARDIOVERSION N/A 05/08/2021   Procedure: TRANSESOPHAGEAL ECHOCARDIOGRAM (TEE);  Surgeon: Wonda Olds, MD;  Location: [facility];  Service: Open Heart Surgery;  Laterality: N/A;   TONSILLECTOMY     TONSILLECTOMY     VIDEO BRONCHOSCOPY Bilateral 07/26/2015   Procedure: VIDEO BRONCHOSCOPY WITH FLUORO;  Surgeon: [name], MD;  [facility]: [facility];  Service: Cardiopulmonary;  Laterality: Bilateral;   VIDEO BRONCHOSCOPY WITH ENDOBRONCHIAL NAVIGATION N/A 08/23/2015   Procedure: VIDEO BRONCHOSCOPY WITH ENDOBRONCHIAL NAVIGATION;  Surgeon: Grace Isaac, MD;  Location[facility];  Service: Thoracic;  Laterality: N/A;   VIDEO BRONCHOSCOPY WITH ENDOBRONCHIAL ULTRASOUND N/A 08/23/2015   Procedure: VIDEO BRONCHOSCOPY WITH ENDOBRONCHIAL ULTRASOUND;  Surgeon: Grace Isaac, MD;  Location: [facility];  Service: Thoracic;  Laterality: N/A;    Family History  Problem Relation Age of Onset   Leukemia Father    Emphysema Father    Learning disabilities Son    Atrial fibrillation Son    Leukemia Other    Stroke Other     Social History   Occupational History   Occupation: Retired    Fish farm manager: [employer]    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[employer]  Tobacco Use   Smoking status: Former    Packs/day: 1.00    Years: 57.00    Total pack years: 57.00    Types: Cigarettes, Cigars    Quit date: 08/08/2015    Years since quitting: 6.8   Smokeless tobacco: Former    Types: Chew    Quit date: 11/04/1958   Tobacco comments:    Will smoke cigar every once in awhile.  Vaping daily started a couple weeks ago.  04/18/22 hfb  Vaping Use   Vaping Use: Former  Substance and Sexual Activity   Alcohol use: Not Currently    Alcohol/week: 0.0 standard drinks of alcohol   Drug use: No   Sexual activity: Not Currently     Physical Exam: Blood pressure 120/70, pulse 61, height 5\' 7"  (1.702 m), weight 183 lb 6.4 oz (83.2 kg), SpO2 97 %.  Gen:      No acute distressLungs:    diminished. No wheezes or crackles CV:         Regular rate and rhythm; no murmurs, rubs, or gallops.  No pedal edema   Data  Reviewed: Imaging: I have personally reviewed the chest xray Nov 2022 with chronic small left effusion and scarring.  PFTs:     Latest Ref Rng & Units 05/19/2017   10:02 AM 08/25/2015    9:55 AM  PFT Results  FVC-Pre L 2.84  3.43   FVC-Predicted Pre % 73  87   FVC-Post L 3.16  3.87   FVC-Predicted Post % 81  98   Pre FEV1/FVC % % 61  67   Post FEV1/FCV % % 61  65   FEV1-Pre L 1.74  2.31   FEV1-Predicted Pre %  62  81   FEV1-Post L 1.93  2.50   DLCO uncorrected ml/min/mmHg 16.14  18.35   DLCO UNC% % 54  61   DLCO corrected ml/min/mmHg 16.10    DLCO COR %Predicted % 54    DLVA Predicted % 71  67   TLC L 5.61  6.24   TLC % Predicted % 84  93   RV % Predicted % 108  121    I have personally reviewed the patient's PFTs and moderately airflow limitation  Labs:  Immunization status: Immunization History  Administered Date(s) Administered   Fluad Quad(high Dose 65+) 07/23/2019, 09/04/2020, 07/13/2021   Influenza Split 08/02/2011, 08/18/2012   Influenza Whole 08/17/2008   Influenza, High Dose Seasonal PF 08/09/2015, 09/30/2016, 07/30/2017, 08/07/2018   Influenza,inj,Quad PF,6+ Mos 07/27/2013, 08/09/2014   PFIZER Comirnaty(Gray Top)Covid-19 Tri-Sucrose Vaccine 02/16/2021   PFIZER(Purple Top)SARS-COV-2 Vaccination 01/28/2020, 02/21/2020, 09/04/2020   Pfizer Covid-19 Vaccine Bivalent Booster 85yrs & up 08/03/2021   Pneumococcal Conjugate-13 08/09/2014   Pneumococcal Polysaccharide-23 05/14/2010   Td 07/25/2008    External Records Personally Reviewed: echocardiogram with diastolic dysfunction  Assessment:  Moderate COPD 62% of predicted, controlled CAD s/p CABG July 2022 Left Pleural effusion, with malignant/atypical cells Atrial Fibrillation 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 Let me know if your breathing is getting worse or you feel like fluid is building back up on your  lungs. We can drain it again. There is a very small amount today that is not enough to drain.   Please come early to next appointment to get a chest xray done. To follow up on the fluid.   Continue Breztri 2 puffs twice a day.   Return to Care: Return in about 3 months (around 09/14/2022).   Lenice Llamas, MD Pulmonary and [place]

## 2022-06-18 ENCOUNTER — Inpatient Hospital Stay: Payer: Medicare Other | Admitting: Internal Medicine

## 2022-06-18 ENCOUNTER — Other Ambulatory Visit: Payer: Self-pay

## 2022-06-18 VITALS — BP 125/64 | HR 42 | Temp 97.9°F | Resp 15 | Ht 67.0 in | Wt 180.5 lb

## 2022-06-18 DIAGNOSIS — Z08 Encounter for follow-up examination after completed treatment for malignant neoplasm: Secondary | ICD-10-CM | POA: Diagnosis not present

## 2022-06-18 DIAGNOSIS — C3490 Malignant neoplasm of unspecified part of unspecified bronchus or lung: Secondary | ICD-10-CM | POA: Diagnosis not present

## 2022-06-18 DIAGNOSIS — Z85118 Personal history of other malignant neoplasm of bronchus and lung: Secondary | ICD-10-CM | POA: Diagnosis not present

## 2022-06-18 NOTE — Progress Notes (Signed)
Robert Bates   [phone]) [phone]  OFFICE PROGRESS NOTE  [name], NP [place] [address]  DIAGNOSIS: Stage IIB (T3, N0, M0) non-small cell lung cancer, squamous cell carcinoma presented with left lower lobe endobronchial lesion as well as suspicious groundglass opacity in the left upper lobe diagnosed in September 2016.  PRIOR THERAPY: Curative radiotherapy to the left upper lobe lung mass under the care of Dr. Sondra Come completed 10/25/2015.  CURRENT THERAPY: Observation  INTERVAL HISTORY: Robert Bates 81 y.o. male returns to the clinic today for follow-up visit.  The patient is feeling fine today with no concerning complaints except for mild shortness of breath and occasional right-sided chest pain.  He denied having any cough or hemoptysis.  He denied having any nausea, vomiting, diarrhea or constipation.  He has no headache or visual changes.  He has no recent weight loss or night sweats.  He had repeat CT scan of the chest performed recently and he is here for evaluation and discussion of his scan results.  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   COPD (chronic obstructive pulmonary disease) (HCC)    Coronary artery disease    Diabetes type 2, controlled ([place]) 07/31/2017   Dyspnea    Dysrhythmia    a fib   GERD (gastroesophageal reflux disease)    Hematuria    refuses work up or referral - understands risks of morbidity / mortality - 11/2008, 12/2008   History of hiatal hernia    History of kidney stones    Hyperlipemia    Meningioma ([place]) 10/25/2013   Follows with Dr. [name].    Peripheral vascular disease  ([place])    Abdominal Aortic Aneursym   Pneumonia    as a child   Radiation 09/18/15-10/25/15   left lower lobe 70.2 Gy   Seizures ([place]) 02/18/2020   Tobacco abuse     ALLERGIES:  is allergic to iodine and iohexol.  MEDICATIONS:  Current Outpatient Medications  Medication Sig Dispense Refill   acetaminophen (TYLENOL) 500 MG tablet Take 1-2 tablets (500-1,000 mg total) by mouth every 6 (six) hours as needed. 30 tablet 0   albuterol (PROVENTIL) (2.5 MG/3ML) 0.083% nebulizer solution Take 3 mLs (2.5 mg total) by nebulization every 6 (six) hours as needed for wheezing or shortness of breath. 75 mL 12   albuterol (VENTOLIN HFA) 108 (90 Base) MCG/ACT inhaler Inhale 2 puffs into the lungs every 6 (six) hours as needed for wheezing or shortness of breath. 8.5 g 5   amiodarone (PACERONE) 200 MG tablet Take 1/2 tablet (100 mg total) by mouth daily. 45 tablet 3   atorvastatin (LIPITOR) 80 MG tablet Take 1 tablet (80 mg total) by mouth daily. 90 tablet 3   Budeson-Glycopyrrol-Formoterol (BREZTRI AEROSPHERE) 160-9-4.8 MCG/ACT AERO Inhale 2 puffs into the lungs in the morning and at bedtime. 10.7 g 5   furosemide (LASIX) 40 MG tablet Take 1 tablet (40 mg total) by mouth daily. 30 tablet 1   gabapentin (NEURONTIN) 300 MG capsule Take 1 capsule (300 mg total) by mouth 3 (three) times daily. (Patient taking differently: Take 300 mg  by mouth 2 (two) times daily.) 90 capsule 1   Levetiracetam 750 MG TB24 Take 1 tablet (750 mg total) by mouth at bedtime. 30 tablet 11   Multiple Vitamin (MULTIVITAMIN WITH MINERALS) TABS tablet Take 1 tablet by mouth daily in the afternoon.     omeprazole (PRILOSEC) 40 MG capsule Take 1 capsule (40 mg total) by mouth daily. 90 capsule 1   potassium chloride (KLOR-CON) 10 MEQ tablet TAKE 2 TABLETS BY MOUTH DAILY FOR 2 DAYS THEN REDUCE TO 1 TABLET BY MOUTH DAILY 92 tablet 3   rivaroxaban (XARELTO) 20 MG TABS tablet TAKE 1 TABLET BY MOUTH DAILY WITH SUPPER (Patient taking  differently: Take 20 mg by mouth daily with supper.) 30 tablet 5   tamsulosin (FLOMAX) 0.4 MG CAPS capsule TAKE 1 CAPSULE (0.4 MG TOTAL) BY MOUTH DAILY. 90 capsule 1   No current facility-administered medications for this visit.    SURGICAL HISTORY:  Past Surgical History:  Procedure Laterality Date   CHOLECYSTECTOMY N/A 07/23/2017   Procedure: LAPAROSCOPIC CHOLECYSTECTOMY;  Surgeon: Kinsinger, Arta Bruce, MD;  Location: [facility];  Service: General;  Laterality: N/A;   CLIPPING OF ATRIAL APPENDAGE Left 05/08/2021   Procedure: CLIPPING OF ATRIAL APPENDAGE USING 63 ATRICLIP;  Surgeon: Wonda Olds, MD;  Location: [facility];  Service: Open Heart Surgery;  Laterality: Left;   COLONOSCOPY     CORONARY ARTERY BYPASS GRAFT N/A 05/08/2021   Procedure: CORONARY ARTERY BYPASS GRAFTING (CABG)X 3 USING LEFT INTERNAL MAMMARY ARTERY AND RIGHT GREATER SAPEHNOUS VEIN;  Surgeon: Wonda Olds, MD;  Location: [facility];  Service: Open Heart Surgery;  Laterality: N/A;   ENDOVEIN HARVEST OF GREATER SAPHENOUS VEIN Right 05/08/2021   Procedure: ENDOVEIN HARVEST OF GREATER SAPHENOUS VEIN;  Surgeon: Wonda Olds, MD;  Location: [facility];  Service: Open Heart Surgery;  Laterality: Right;   EYE SURGERY Bilateral    Cataracts removed w/ lens implant   HERNIA REPAIR     Left 36 years ago . Right inguinal hernia repair 10-01-17 Dr. Kieth Brightly   INGUINAL HERNIA REPAIR Right 10/01/2017   Procedure: RIGHT INGUINAL HERNIA REPAIR WITH MESH;  Surgeon: Kinsinger, Arta Bruce, MD;  Location: [facility];  Service: General;  Laterality: Right;  TAP BLOCK   INSERTION OF MESH Right 10/01/2017   Procedure: INSERTION OF MESH;  Surgeon: Kieth Brightly Arta Bruce, MD;  Location: [facility];  Service: General;  Laterality: Right;   IR THORACENTESIS ASP PLEURAL SPACE W/IMG GUIDE  05/18/2021   IR THORACENTESIS ASP PLEURAL SPACE W/IMG GUIDE  06/07/2021   LEFT HEART CATH AND CORONARY ANGIOGRAPHY N/A 04/20/2021   Procedure: LEFT HEART CATH AND CORONARY  ANGIOGRAPHY;  Surgeon: Wellington Hampshire, MD;  Location: [facility];  Service: Cardiovascular;  Laterality: N/A;   TEE WITHOUT CARDIOVERSION N/A 05/08/2021   Procedure: TRANSESOPHAGEAL ECHOCARDIOGRAM (TEE);  Surgeon: Wonda Olds, MD;  Location: [facility];  Service: Open Heart Surgery;  Laterality: N/A;   TONSILLECTOMY     TONSILLECTOMY     VIDEO BRONCHOSCOPY Bilateral 07/26/2015   Procedure: VIDEO BRONCHOSCOPY WITH FLUORO;  Surgeon: [name], MD;  [facility];  Service: Cardiopulmonary;  Laterality: Bilateral;   VIDEO BRONCHOSCOPY WITH ENDOBRONCHIAL NAVIGATION N/A 08/23/2015   Procedure: VIDEO BRONCHOSCOPY WITH ENDOBRONCHIAL NAVIGATION;  Surgeon: Grace Isaac, MD;  Location: [facility];  Service: Thoracic;  Laterality: N/A;   VIDEO BRONCHOSCOPY WITH ENDOBRONCHIAL ULTRASOUND N/A 08/23/2015   Procedure: VIDEO BRONCHOSCOPY WITH ENDOBRONCHIAL ULTRASOUND;  Surgeon: Grace Isaac, MD;  Location: [facility];  Service: Thoracic;  Laterality: N/A;    REVIEW OF SYSTEMS:  A comprehensive review of systems was negative except for: Constitutional: positive for fatigue Respiratory: positive for dyspnea on exertion and pleurisy/chest pain   PHYSICAL EXAMINATION: General appearance: alert, cooperative and no distress Head: Normocephalic, without obvious abnormality, atraumatic Neck: no adenopathy, no JVD, supple, symmetrical, trachea midline and thyroid not enlarged, symmetric, no tenderness/mass/nodules Lymph nodes: Cervical, supraclavicular, and axillary nodes normal. Resp: wheezes bilaterally Back: symmetric, no curvature. ROM normal. No CVA tenderness. Cardio: regular rate and rhythm, S1, S2 normal, no murmur, click, rub or gallop GI: soft, non-tender; bowel sounds normal; no masses,  no organomegaly Extremities: extremities normal, atraumatic, no cyanosis or edema  ECOG PERFORMANCE STATUS: 1 - Symptomatic but completely ambulatory  Blood pressure 125/64, pulse (!) 42,  temperature 97.9 F (36.6 C), temperature source Oral, resp. rate 15, height 5\' 7"  (1.702 m), weight 180 lb 8 oz (81.9 kg), SpO2 96 %.  LABORATORY DATA: Lab Results  Component Value Date   WBC 7.0 06/14/2022   HGB 12.0 (L) 06/14/2022   HCT 38.1 (L) 06/14/2022   MCV 87.6 06/14/2022   PLT 283 06/14/2022      Chemistry      Component Value Date/Time   NA 143 06/14/2022 1324   NA 141 04/05/2022 1115   NA 140 06/17/2017 1315   K 4.3 06/14/2022 1324   K 4.5 06/17/2017 1315   CL 105 06/14/2022 1324   CO2 32 06/14/2022 1324   CO2 28 06/17/2017 1315   BUN 20 06/14/2022 1324   BUN 13 04/05/2022 1115   BUN 18.1 06/17/2017 1315   CREATININE 0.96 06/14/2022 1324   CREATININE 0.84 12/08/2020 1609   CREATININE 0.8 06/17/2017 1315      Component Value Date/Time   CALCIUM 9.3 06/14/2022 1324   CALCIUM 9.8 06/17/2017 1315   ALKPHOS 93 06/14/2022 1324   ALKPHOS 82 06/17/2017 1315   AST 13 (L) 06/14/2022 1324   AST 16 06/17/2017 1315   ALT 15 06/14/2022 1324   ALT 10 06/17/2017 1315   BILITOT 0.4 06/14/2022 1324   BILITOT 0.47 06/17/2017 1315       RADIOGRAPHIC STUDIES: CT Chest Wo Contrast  Result Date: 06/16/2022 CLINICAL DATA:  Non-small cell lung cancer staging, XRT complete * Tracking Code: BO * EXAM: CT CHEST WITHOUT CONTRAST TECHNIQUE: Multidetector CT imaging of the chest was performed following the standard protocol without IV contrast. RADIATION DOSE REDUCTION: This exam was performed according to the departmental dose-optimization program which includes automated exposure control, adjustment of the mA and/or kV according to patient size and/or use of iterative reconstruction technique. COMPARISON:  PET-CT, 05/30/2022, CT chest, 05/21/2022 FINDINGS: Cardiovascular: Aortic atherosclerosis. Normal heart size. Three-vessel coronary artery calcifications status post median sternotomy and CABG. Left atrial appendage clip. No pericardial effusion. Mediastinum/Nodes: Unchanged  prominent subcentimeter mediastinal lymph nodes (series 2, image 62). No overtly enlarged mediastinal, hilar, or axillary lymph nodes. Moderate hiatal hernia with intrathoracic position of the gastric fundus. Thyroid gland, trachea, and esophagus demonstrate no significant findings. Lungs/Pleura: Mild centrilobular and paraseptal emphysema. Unchanged post treatment appearance of the left chest, with dense perihilar and suprahilar consolidation and fibrosis (series 5, image 73). Unchanged moderate left pleural effusion and associated atelectasis or consolidation. Background of very fine pulmonary nodules, most concentrated in the apices. Upper Abdomen: No acute abnormality. Status post cholecystectomy. Benign, fat containing adenoma of the medial limb of the left adrenal gland (series 2, image 161).  Musculoskeletal: No chest wall abnormality. No acute osseous findings. IMPRESSION: 1. Unchanged post treatment appearance of the left chest, with dense perihilar and suprahilar consolidation and fibrosis. Unchanged moderate left pleural effusion and associated atelectasis or consolidation. 2. Unchanged prominent subcentimeter mediastinal lymph nodes, most likely reactive. Attention on follow-up. 3. Background of very fine pulmonary nodules, most concentrated in the apices, most likely smoking-related respiratory bronchiolitis. 4. Emphysema. 5. Coronary artery disease. Aortic Atherosclerosis (ICD10-I70.0) and Emphysema (ICD10-J43.9). Electronically Signed   By: Delanna Ahmadi M.D.   On: 06/16/2022 15:20   NM PET Image Initial (PI) Skull Base To Thigh (F-18 FDG)  Result Date: 05/30/2022 CLINICAL DATA:  Initial treatment strategy for pleural effusion. EXAM: NUCLEAR MEDICINE PET SKULL BASE TO THIGH TECHNIQUE: 9.03 mCi F-18 FDG was injected intravenously. Full-ring PET imaging was performed from the skull base to thigh after the radiotracer. CT data was obtained and used for attenuation correction and anatomic localization.  Fasting blood glucose: 110 mg/dl COMPARISON:  CT May 06, 2022, May 21, 2022 and MRI abdomen May 11, 2022 FINDINGS: Mediastinal blood pool activity: SUV max 2.0 Liver activity: SUV max NA NECK: No hypermetabolic cervical adenopathy. Incidental CT findings: none CHEST: Moderate left pleural effusion does not demonstrate significant abnormal FDG avidity. Low-level homogeneous hypermetabolic activity associated with the post radiation change in the left upper lobe superior segment left lower lobe and perihilar region with associated volume loss in left hemithorax and a max SUV 2.7. No hypermetabolic pulmonary nodules or masses. Low-level metabolic activity in prominent mediastinal lymph nodes which appear unchanged in size from prior imaging for instance a 9 mm subcarinal lymph node demonstrates a max SUV of 3.1. Incidental CT findings: Aortic atherosclerosis. Left atrial appendage occlusion device. Prior median sternotomy and CABG. Large hiatal hernia/intrathoracic stomach. ABDOMEN/PELVIS: No abnormal hypermetabolic activity within the liver, pancreas, adrenal glands, or spleen. No hypermetabolic lymph nodes in the abdomen or pelvis. Mildly metabolic 14 mm left adrenal nodule previously characterized as a benign adenoma on MRI abdomen May 11, 2022 and requiring no independent imaging follow-up. Incidental CT findings: Bilobar hypodense hepatic lesions do not demonstrate abnormal FDG avidity and are stable over multiple prior examinations previously characterized as cysts on MRI May 11, 2022. Punctate nonobstructive left interpolar renal calculus. Hemorrhagic left exophytic renal cysts. Unchanged large spiculated bladder calculus measuring 2.3 cm. Colonic diverticulosis without findings of acute diverticulitis. Aortic atherosclerosis. Similar aneurysmal dilation of the suprarenal abdominal aorta measuring 3.2 cm. SKELETON: No focal hypermetabolic activity to suggest skeletal metastasis. Incidental CT findings:  Unchanged minimally displaced fracture of the left third rib. Non hypermetabolic dense sclerotic lesion in the T8 vertebral body is unchanged likely reflecting a bone island. Multilevel degenerative changes spine with multifocal degenerative joint disease. IMPRESSION: 1. No significant abnormal FDG avidity in the moderate size left pleural effusion. 2. Low-level homogeneous metabolic activity in the masslike fibrotic change with architectural distortion in the left hemithorax consistent with post treatment/radiation change. 3. Mildly metabolic prominent mediastinal lymph nodes measure up to 9 mm stable in size dating back to at least June 14, 2021 and favored reactive. Attention on follow-up imaging suggested. 4. No evidence of hypermetabolic distant metastatic disease. 5.  Aortic Atherosclerosis (ICD10-I70.0). Electronically Signed   By: Dahlia Bailiff M.D.   On: 05/30/2022 14:25   CT Super D Chest Wo Contrast  Result Date: 05/23/2022 CLINICAL DATA:  Malignant effusion. * Tracking Code: BO * EXAM: CT CHEST WITHOUT CONTRAST TECHNIQUE: Multidetector CT imaging of the chest was performed using  thin slice collimation for electromagnetic bronchoscopy planning purposes, without intravenous contrast. RADIATION DOSE REDUCTION: This exam was performed according to the departmental dose-optimization program which includes automated exposure control, adjustment of the mA and/or kV according to patient size and/or use of iterative reconstruction technique. COMPARISON:  Chest CTA 05/06/2022 FINDINGS: Cardiovascular: The heart size is normal. No substantial pericardial effusion. Coronary artery calcification is evident. Mild atherosclerotic calcification is noted in the wall of the thoracic aorta. Mediastinum/Nodes: No mediastinal lymphadenopathy. Soft tissue fullness in the left hilar region is similar to prior. The esophagus has normal imaging features. There is no axillary lymphadenopathy. Lungs/Pleura: Centrilobular  and paraseptal emphysema evident. Parahilar left lung scarring again noted with volume loss left hemithorax, likely related to post treatment changes. Small to moderate pleural effusion is decreased in the interval. No new suspicious pulmonary nodule or mass. Upper Abdomen: Scattered tiny hypodensities in the liver parenchyma cannot be definitively characterized but are stable in the interval 14 mm left adrenal nodule is stable since 06/14/2021, likely adenoma. Musculoskeletal: No worrisome lytic or sclerotic osseous abnormality. Dense sclerotic lesion in the T8 vertebral body is unchanged. This is likely a bone island. IMPRESSION: 1. Stable exam. No new or progressive findings. 2. Small to moderate left pleural effusion, slightly decreased in the interval. 3. Stable 14 mm left adrenal nodule, likely adenoma. 4. Aortic Atherosclerosis (ICD10-I70.0) and Emphysema (ICD10-J43.9). Electronically Signed   By: Misty Stanley M.D.   On: 05/23/2022 08:46     ASSESSMENT AND PLAN:  This is a very pleasant 81 years old white male with a stage IIB non-small cell lung cancer, squamous cell carcinoma presented with left upper lobe endobronchial lesion in addition to left upper lobe suspicious groundglass opacity diagnosed in September 2016. He is status post curative radiotherapy under the care of Dr. Sondra Come. The patient is currently on observation and he is feeling fine with no concerning complaints. He had repeat CT scan of the chest performed recently.  I personally and independently reviewed the scan and discussed the result with the patient today. His scan showed no concerning findings for disease recurrence or metastasis.  He continues to have small to moderate left pleural effusion that is slightly decreased in the interval. I recommended for him to continue on observation with repeat CT scan of the chest in 1 year. The patient was advised to call immediately if he has any other concerning symptoms in the  interval. The patient voices understanding of current disease status and treatment options and is in agreement with the current care plan. All questions were answered. The patient knows to call the clinic with any problems, questions or concerns. We can certainly see the patient much sooner if necessary.  Disclaimer: This note was dictated with voice recognition software. Similar sounding words can inadvertently be transcribed and may not be corrected upon review.

## 2022-06-19 ENCOUNTER — Other Ambulatory Visit (HOSPITAL_BASED_OUTPATIENT_CLINIC_OR_DEPARTMENT_OTHER): Payer: Self-pay

## 2022-06-20 ENCOUNTER — Other Ambulatory Visit: Payer: Self-pay | Admitting: Cardiovascular Disease

## 2022-06-20 ENCOUNTER — Other Ambulatory Visit (HOSPITAL_BASED_OUTPATIENT_CLINIC_OR_DEPARTMENT_OTHER): Payer: Self-pay

## 2022-06-20 DIAGNOSIS — I480 Paroxysmal atrial fibrillation: Secondary | ICD-10-CM

## 2022-06-20 MED ORDER — RIVAROXABAN 20 MG PO TABS
ORAL_TABLET | ORAL | 2 refills | Status: DC
  Filled 2022-06-20 – 2022-06-26 (×2): qty 90, 90d supply, fill #0
  Filled 2022-07-05: qty 30, 30d supply, fill #0
  Filled 2022-07-15: qty 30, 30d supply, fill #1

## 2022-06-20 NOTE — Telephone Encounter (Signed)
Xarelto 20mg  refill request received. Pt is 81 years old, weight-81.9kg, Crea-0.96 on 8/11/20203, last seen by Richardson Dopp, PA on 04/05/2022, Diagnosis-Afib, CrCl- 69.61ml/min; Dose is appropriate based on dosing criteria. Will send in refill to requested pharmacy.

## 2022-06-25 ENCOUNTER — Telehealth: Payer: Self-pay | Admitting: Pharmacist

## 2022-06-25 ENCOUNTER — Other Ambulatory Visit (HOSPITAL_BASED_OUTPATIENT_CLINIC_OR_DEPARTMENT_OTHER): Payer: Self-pay

## 2022-06-25 NOTE — Telephone Encounter (Signed)
Received message from [facility] pharmacist, Kristeen Miss regarding pateint's Xarelto. She provided information about new program that patient might qualify for  thru [employer] to provide Xarelto at no cost.  Discussed with patient. He is to look for last years tax return so we can complete application.  While speaking with patient he reports he did contact [employer] [facility] - but has not ordered any medication from them. Patient reports he was told that [facility] might be able to match the [employer] of $85/ 30 days or $240 / 90 days. [facility] pharmacy. They have Rx from cardiologist for 90 day supply but unable to run on patients Medicare to check cost until tomorrow.  Was provided with [facility] pharm tech name of Barrington Ellison that has been in contact with [facility] about Xarelto cost. Camillia Herter reports he is not completing paperwork at this times for [facility] medication assistance program. Will continue to assist patient.

## 2022-06-26 ENCOUNTER — Ambulatory Visit: Payer: Medicare Other | Admitting: Pharmacist

## 2022-06-26 ENCOUNTER — Telehealth: Payer: Self-pay | Admitting: Internal Medicine

## 2022-06-26 ENCOUNTER — Other Ambulatory Visit (HOSPITAL_BASED_OUTPATIENT_CLINIC_OR_DEPARTMENT_OTHER): Payer: Self-pay

## 2022-06-26 DIAGNOSIS — I4819 Other persistent atrial fibrillation: Secondary | ICD-10-CM

## 2022-06-26 DIAGNOSIS — I5032 Chronic diastolic (congestive) heart failure: Secondary | ICD-10-CM

## 2022-06-26 NOTE — Patient Instructions (Signed)
Mr. Balaguer It was a pleasure speaking with you today.  Below is a summary of your health goals and summary of our recent visit. You can also view your updated Chronic Care Management Care plan through your MyChart account.   Take Xarelto once a day with evening meal - improves absorption. Patient signed medication assistance program application today for Xarelto. Forwarded to PCP to review and sign.    As always if you have any questions or concerns especially regarding medications, please feel free to contact me either at the phone number below or with a MyChart message.   Keep up the good work!  Cherre Robins, PharmD Clinical Pharmacist [facility] [place] [phone] (direct line)  [phone] (main office number)   The patient verbalized understanding of instructions, educational materials, and care plan provided today and DECLINED offer to receive copy of patient instructions, educational materials, and care plan.

## 2022-06-26 NOTE — Telephone Encounter (Signed)
Called patient regarding upcoming 2024 appointment, patient is notified.

## 2022-06-26 NOTE — Chronic Care Management (AMB) (Signed)
Chronic Care Management Pharmacy Note  06/26/2022 Name:  Olan Kurek MRN:  [id number] DOB:  17-Jan-1941  Summary: Reviewed adherence - patient is due to refill Xarelto. Started application for medication assistance program with Wynetta Emery and Wynetta Emery for [employer]. Spoke with patient's pharmacy and they are in process of filling Rx. They has requested 90 day supply Rx but asked if they would only fill 30 days until we can complete medication assistance program application and see if patient might qualify to get Xarelto at no cost thru the end of 2023. Patient came into office to sign medication assistance program application today. Just need household income and PCP signature to complete.   CHF - Reminded to take furosemide 49m daily and to check weight each morning. Patient will report weight gain of more than 3 lbs in 24 hours or 5 lbs in 1 week. Contact office if you have worsening abdominal fullness or any of these other symptoms. Weight gain, shortness of breath, abdominal fullness, swelling in legs or abdomen, Fatigue and weakness, changes in ability to perform usual activities, persistent cough or wheezing with white or pink blood-tinged mucus, nausea and lack of appetite   Subjective: SNicolae Vasekis an 81y.o. year old male who is a primary patient of ODebbrah Alar NP.  The CCM team was consulted for assistance with disease management and care coordination needs.    Engaged with patient by telephone for  follow up  in response to provider referral for pharmacy case management and/or care coordination services.   Consent to Services:  The patient was given information about Chronic Care Management services, agreed to services, and gave verbal consent prior to initiation of services.  Please see initial visit note for detailed documentation.   Patient Care Team: ODebbrah Alar NP as PCP - General Nahser, PWonda Cheng MD as PCP - Cardiology (Cardiology) WElsie Stain MD  as Attending Physician (Pulmonary Disease) PVirgina Evener O[employer]perial(Optometry) ECherre Robins RPH-CPP (Pharmacist) WSharmon Revereas Physician Assistant (Cardiology) DSpero Geralds MD as Consulting Physician (Pulmonary Disease)  Recent office visits: 05/20/2022 - Int Med (Inda Castle NP) Hospital follow up. Ordered chest xray to check pleural effusion. Checked BMET and CBC. However looks like labs and xray were not completed. No med changes noted.  04/19/2022 - Int Med (Inda Castle NP) F/U chronic conditions. Continues to have neck and back pain. He is also concerned with increase in weight. No med changes notes.  03/15/2022 - Int Med (Inda Castle NP) Seen for increased urinary frequency.  Referred to neurology for f/u seizures. Noted to have microscopic hematuria - referred back to urology.  01/15/2022 - Int Med (Inda Castle Seen for right groin pain, neuropathy and left eye vision changes. Referred to general surgeon for consultation for inguinal hernia. Started gabapentin 3093monce daily for neuropathy   Recent consult visits: 06/18/2022 - Oncology (Dr MoJulien NordmannF/U Stage IIB (T3, N0, M0) non-small cell lung cancer, squamous cell carcinoma presented with left lower lobe endobronchial lesion as well as suspicious groundglass opacity in the left upper lobe diagnosed in September 2016. PRIOR THERAPY: Curative radiotherapy to the left upper lobe lung mass under the care of Dr. KiSondra Comeompleted 10/25/2015.   CURRENT THERAPY: Observation. CT scan of chest showed no concerning findings for disease recurrence or metastasis. F/U 1 year. 06/14/2022 - Pulmonology (Dr DeShearon StallsSeen for follow up pleural effusion and COPD. No evidence of cancer recurrence at this time. Let me know if your breathing is getting worse or  you feel like fluid is building back up on your lungs. We can drain it again. There is a very small amount today that is not enough to drain. Next appointment to get a chest xray to follow up  on the fluid. F/U 3 months.   04/18/2022 - Pulm (Dr Shearon Stalls) F/U COPD. No med changes noted. 04/12/2022 - Neurology (Dr April Manson) Seen for seizures. Restarted Keppra XR 715m daily. F/U 5 months.  [facility] and afib. Noted volume excess. Increased furosemide to 427mdaily for 2 days (also take 2023mfor 3 days) (if edema continues could consider SGLT2) Lowered dose of amiodarone to 100m63mily  01/11/2022 - Pulmonology (Dr DesaShearon Stallsescribed prednisone 40mg52mly for 5 days.    Hospital visits: None in last 6 months   Objective:  Lab Results  Component Value Date   CREATININE 0.96 06/14/2022   CREATININE 0.86 05/07/2022   CREATININE 0.90 05/06/2022    Lab Results  Component Value Date   HGBA1C 6.4 04/19/2022   Last diabetic Eye exam:  Lab Results  Component Value Date/Time   HMDIABEYEEXA No Retinopathy 06/16/2021 01:36 PM    Last diabetic Foot exam: No results found for: "HMDIABFOOTEX"      Component Value Date/Time   CHOL 140 07/13/2021 1336   TRIG 72.0 07/13/2021 1336   HDL 67.00 07/13/2021 1336   CHOLHDL 2 07/13/2021 1336   VLDL 14.4 07/13/2021 1336   LDLCALC 58 07/13/2021 1336   LDLCALC 62 04/11/2021 1421   LDLDIRECT 142.2 12/26/2008 1032       Latest Ref Rng & Units 06/14/2022    1:24 PM 05/07/2022    4:47 AM 04/05/2022   11:15 AM  Hepatic Function  Total Protein 6.5 - 8.1 g/dL 6.6  7.2  6.0   Albumin 3.5 - 5.0 g/dL 4.0  3.8  3.9   AST 15 - 41 U/L _0 ALT 0 - 44 U/L _1 Alk Phosphatase 38 - 126 U/L 93  92  102   Total Bilirubin 0.3 - 1.2 mg/dL 0.4  0.6  0.3     Lab Results  Component Value Date/Time   TSH 3.620 04/05/2022 11:15 AM   TSH 4.26 08/03/2021 08:25 AM       Latest Ref Rng & Units 06/14/2022    1:24 PM 05/07/2022    4:47 AM 05/06/2022    1:23 PM  CBC  WBC 4.0 - 10.5 K/uL 7.0  8.0  7.2   Hemoglobin 13.0 - 17.0 g/dL 12.0  12.7  11.6   Hematocrit 39.0 - 52.0 % 38.1  41.3  38.0   Platelets 150 - 400  K/uL 283  282  227     No results found for: "VD25OH"  Clinical ASCVD: Yes  The ASCVD Risk score (Arnett DK, et al., 2019) failed to calculate for the following reasons:   The 2019 ASCVD risk score is only valid for ages 40 to549   27ther: CHADS2VASc = 5   Social History   Tobacco Use  Smoking Status Former   Packs/day: 1.00   Years: 57.00   Total pack years: 57.00   Types: Cigarettes, Cigars   Quit date: 08/08/2015   Years since quitting: 6.8  Smokeless Tobacco Former   Types: Chew   Quit date: 11/04/1958  Tobacco Comments   Will smoke cigar every once in awhile.  Vaping daily started a couple weeks  ago.  04/18/22 hfb   BP Readings from Last 3 Encounters:  06/18/22 125/64  06/14/22 120/70  05/20/22 125/67   Pulse Readings from Last 3 Encounters:  06/18/22 (!) 42  06/14/22 61  05/20/22 64   Wt Readings from Last 3 Encounters:  06/18/22 180 lb 8 oz (81.9 kg)  06/14/22 183 lb 6.4 oz (83.2 kg)  05/20/22 181 lb (82.1 kg)    Assessment: Review of patient past medical history, allergies, medications, health status, including review of consultants reports, laboratory and other test data, was performed as part of comprehensive evaluation and provision of chronic care management services.   SDOH:  (Social Determinants of Health) assessments and interventions performed:      CCM Care Plan  Allergies  Allergen Reactions   Iodine Other (See Comments)    neck swells   Iohexol Swelling    Neck and gland swelling per patient.    Medications Reviewed Today     Reviewed by Spero Geralds, MD (Physician) on 06/14/22 at 1705  Med List Status: <None>   Medication Order Taking? Sig Documenting Provider Last Dose Status Informant  acetaminophen (TYLENOL) 500 MG tablet [id number]  Take 1-2 tablets (500-1,000 mg total) by mouth every 6 (six) hours as needed. Nani Skillern, PA-C  Active Multiple Informants, Self, Pharmacy Records  albuterol (PROVENTIL) (2.5 MG/3ML) 0.083%  nebulizer solution [id number] Yes Take 3 mLs (2.5 mg total) by nebulization every 6 (six) hours as needed for wheezing or shortness of breath. Spero Geralds, MD Taking Active Multiple Informants, Self, Pharmacy Records           Med Note [facility], [place] B   Thu Jun 06, 2022 11:35 AM)    albuterol (VENTOLIN HFA) 108 (90 Base) MCG/ACT inhaler [id number] Yes Inhale 2 puffs into the lungs every 6 (six) hours as needed for wheezing or shortness of breath. [name], NP Taking Active Multiple Informants, Self, Pharmacy Records           Med Note [place], [place] B   Thu Jun 06, 2022 11:36 AM)    amiodarone (PACERONE) 200 MG tablet [id number]  Take 1/2 tablet (100 mg total) by mouth daily. Richardson Dopp T, PA-C  Active Multiple Informants, Self, Pharmacy Records  atorvastatin (LIPITOR) 80 MG tablet [id number]  Take 1 tablet (80 mg total) by mouth daily. Nahser, Wonda Cheng, MD  Active Multiple Informants, Self, Pharmacy Records  Budeson-Glycopyrrol-Formoterol [facility] AEROSPHERE) 160-9-4.8 MCG/ACT Hollie Salk [id number] Yes Inhale 2 puffs into the lungs in the morning and at bedtime. Spero Geralds, MD Taking Active Multiple Informants, Self, Pharmacy Records           Med Note [facility], [place] B   Mon May 13, 2022 11:45 AM)    furosemide (LASIX) 40 MG tablet [id number]  Take 1 tablet (40 mg total) by mouth daily. Nita Sells, MD  Active   gabapentin (NEURONTIN) 300 MG capsule [id number]  Take 1 capsule (300 mg total) by mouth 3 (three) times daily.  Patient taking differently: Take 300 mg by mouth 2 (two) times daily.   [name], NP  Active   Levetiracetam 750 MG TB24 [id number]  Take 1 tablet (750 mg total) by mouth at bedtime. Alric Ran, MD  Active Multiple Informants, Self, Pharmacy Records    Discontinued 05/08/21 1058 Multiple Vitamin (MULTIVITAMIN WITH MINERALS) TABS tablet [id number]  Take 1 tablet by mouth daily in the afternoon. [provider]  Active Multiple Informants,  Self, Pharmacy Records  Med Note (ECKARD, TAMMY B   Mon May 13, 2022 11:57 AM)    omeprazole (PRILOSEC) 40 MG capsule [id number]  Take 1 capsule (40 mg total) by mouth daily. O'Sullivan, Melissa, NP  Active   potassium chloride (KLOR-CON) 10 MEQ tablet [id number]  TAKE 2 TABLETS BY MOUTH DAILY FOR 2 DAYS THEN REDUCE TO 1 TABLET BY MOUTH DAILY Weaver, Scott T, PA-C  Active Multiple Informants, Self, Pharmacy Records  rivaroxaban (XARELTO) 20 MG TABS tablet [id number]  TAKE 1 TABLET BY MOUTH DAILY WITH SUPPER  Patient taking differently: Take 20 mg by mouth daily with supper.   Nahser, Philip J, MD  Active Multiple Informants, Self, Pharmacy Records           Med Note (ECKARD, TAMMY B   Mon May 13, 2022 11:58 AM)    tamsulosin (FLOMAX) 0.4 MG CAPS capsule [id number]  TAKE 1 CAPSULE (0.4 MG TOTAL) BY MOUTH DAILY. O'Sullivan, Melissa, NP  Active             Patient Active Problem List   Diagnosis Date Noted   Pleural effusion 05/20/2022   Aortic dilatation (HCC) 05/13/2022   Dyspnea on exertion 05/06/2022   Thoracic aortic aneurysm (HCC) 04/04/2022   Lower urinary tract symptoms (LUTS) 03/15/2022   Lumbar radiculopathy 03/15/2022   Chronic heart failure with preserved ejection fraction (HCC) 03/15/2022   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/15/2022   Unilateral inguinal hernia without obstruction or gangrene 01/15/2022   COPD exacerbation (HCC) 10/03/2021   Persistent atrial fibrillation (HCC) 05/28/2021   Secondary hypercoagulable state (HCC) 05/28/2021   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   Coronary artery disease 05/08/2021   COVID-19 virus infection 04/23/2021   Erectile dysfunction 04/11/2021   Head trauma 03/07/2021   Left leg pain 04/07/2020   Benign prostatic hyperplasia with nocturia 03/08/2020   Seizures (HCC) 02/18/2020   PAF (paroxysmal atrial fibrillation) (HCC) 08/15/2017   Diabetes type 2, controlled (HCC) 07/31/2017   COPD GOLD II  04/03/2017   Primary malignant  neoplasm of bronchus of left lower lobe (HCC) 09/06/2015   Lung cancer (HCC) 11/07/2014   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   Essential hypertension 04/29/2014   Meningioma (HCC) 10/25/2013   Low back pain 10/25/2013   Osteoarthritis 08/18/2012   Atypical chest pain 08/14/2011   KERATOSIS 10/09/2010   SCIATICA, RIGHT 10/09/2010   UNSPECIFIED HEARING LOSS 05/21/2010   Hyperlipidemia LDL goal <70 05/14/2010   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   RENAL CYST, RIGHT 02/05/2010   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HCC) 01/29/2010   LIPOMA 11/17/2009   MICROSCOPIC HEMATURIA 08/11/2008   GERD 07/26/2008   DERMOID CYST 07/25/2008    Immunization History  Administered Date(s) Administered   Fluad Quad(high Dose 65+) 07/23/2019, 09/04/2020, 07/13/2021   Influenza Split 08/02/2011, 08/18/2012   Influenza Whole 08/17/2008   Influenza, High Dose Seasonal PF 08/09/2015, 09/30/2016, 07/30/2017, 08/07/2018   Influenza,inj,Quad PF,6+ Mos 07/27/2013, 08/09/2014   PFIZER Comirnaty(Gray Top)Covid-19 Tri-Sucrose Vaccine 02/16/2021   PFIZER(Purple Top)SARS-COV-2 Vaccination 01/28/2020, 02/21/2020, 09/04/2020   Pfizer Covid-19 Vaccine Bivalent Booster 12yrs & up 08/03/2021   Pneumococcal Conjugate-13 08/09/2014   Pneumococcal Polysaccharide-23 05/14/2010   Td 07/25/2008    Conditions to be addressed/monitored: Atrial Fibrillation, CHF, HTN, HLD, COPD, and Gout  There are no care plans that you recently modified to display for this patient.       Medication Assistance:  Breztri obtained through [place] and [place].  Enrollment ends 11/03/2022  .  Usually hit coverage gap with   Xarelto but they do not have medication assistance program for Medicare patients for 2023. Patient has not hit coverage gap yet.    Patient's preferred pharmacy is:  [facility] [address], [address] [place] [address] Phone: [phone] Fax: [phone]   Follow Up:   Patient agrees to Care Plan and Follow-up.  Plan: Telephone follow up appointment with care management team member scheduled for:   1 week clinical pharmacist.  .   Tammy Eckard, PharmD Clinical Pharmacist [facility]  

## 2022-07-02 ENCOUNTER — Other Ambulatory Visit (HOSPITAL_BASED_OUTPATIENT_CLINIC_OR_DEPARTMENT_OTHER): Payer: Self-pay

## 2022-07-03 ENCOUNTER — Other Ambulatory Visit (HOSPITAL_BASED_OUTPATIENT_CLINIC_OR_DEPARTMENT_OTHER): Payer: Self-pay

## 2022-07-03 ENCOUNTER — Ambulatory Visit: Payer: Medicare Other | Admitting: Pharmacist

## 2022-07-03 DIAGNOSIS — I4819 Other persistent atrial fibrillation: Secondary | ICD-10-CM

## 2022-07-03 DIAGNOSIS — I5032 Chronic diastolic (congestive) heart failure: Secondary | ICD-10-CM

## 2022-07-03 NOTE — Chronic Care Management (AMB) (Signed)
Chronic Care Management Pharmacy Note  07/03/2022 Name:  Robert Bates MRN:  [id number] DOB:  [birth year]  Summary: Reviewed adherence -  Started application 35/36/1443 for medication assistance program with Wynetta Emery and Wynetta Emery for [employer]. We were waiting for patient to provide household income. He called today with that information. ALso contacted his pharmacy regarding current out of pocket spend. They are sending a printout. Will fax medication assistance program application.    CHF - Reminded to take furosemide 40mg  daily and to check weight each morning. Patient will report weight gain of more than 3 lbs in 24 hours or 5 lbs in 1 week. Contact office if you have worsening abdominal fullness or any of these other symptoms. Weight gain, shortness of breath, abdominal fullness, swelling in legs or abdomen, Fatigue and weakness, changes in ability to perform usual activities, persistent cough or wheezing with white or pink blood-tinged mucus, nausea and lack of appetite   Subjective: Robert Bates is an 81 y.o. year old male who is a primary patient of [name], NP.  The CCM team was consulted for assistance with disease management and care coordination needs.    Engaged with patient by telephone for  follow up  in response to provider referral for pharmacy case management and/or care coordination services.   Consent to Services:  The patient was given information about Chronic Care Management services, agreed to services, and gave verbal consent prior to initiation of services.  Please see initial visit note for detailed documentation.   Patient Care Team: [name], NP as PCP - General Nahser, Wonda Cheng, MD as PCP - Cardiology (Cardiology) Elsie Stain, MD as Attending Physician (Pulmonary Disease) Virgina Evener, [name] (Optometry) Cherre Robins, RPH-CPP (Pharmacist) Sharmon Revere as Physician Assistant (Cardiology) Spero Geralds, MD as Consulting  Physician (Pulmonary Disease)  Recent office visits: 05/20/2022 - Int Med Inda Castle, NP) Hospital follow up. Ordered chest xray to check pleural effusion. Checked BMET and CBC. However looks like labs and xray were not completed. No med changes noted.  04/19/2022 - Int Med Inda Castle, NP) F/U chronic conditions. Continues to have neck and back pain. He is also concerned with increase in weight. No med changes notes.  03/15/2022 - Int Med Inda Castle, NP) Seen for increased urinary frequency.  Referred to neurology for f/u seizures. Noted to have microscopic hematuria - referred back to urology.  01/15/2022 - Int Med Inda Castle) Seen for right groin pain, neuropathy and left eye vision changes. Referred to general surgeon for consultation for inguinal hernia. Started gabapentin 300mg  once daily for neuropathy   Recent consult visits: 06/18/2022 - Oncology (Dr Julien Nordmann) F/U Stage IIB (T3, N0, M0) non-small cell lung cancer, squamous cell carcinoma presented with left lower lobe endobronchial lesion as well as suspicious groundglass opacity in the left upper lobe diagnosed in September 2016. PRIOR THERAPY: Curative radiotherapy to the left upper lobe lung mass under the care of Dr. Sondra Come completed 10/25/2015.   CURRENT THERAPY: Observation. CT scan of chest showed no concerning findings for disease recurrence or metastasis. F/U 1 year. 06/14/2022 - Pulmonology (Dr Shearon Stalls) Seen for follow up pleural effusion and COPD. No evidence of cancer recurrence at this time. Let me know if your breathing is getting worse or you feel like fluid is building back up on your lungs. We can drain it again. There is a very small amount today that is not enough to drain. Next appointment to get a chest xray to follow up on  the fluid. F/U 3 months.   04/18/2022 - Pulm (Dr Shearon Stalls) F/U COPD. No med changes noted. 04/12/2022 - Neurology (Dr April Manson) Seen for seizures. Restarted Keppra XR 750mg  daily. F/U 5 months.  04/05/2022  - Cardio Kathlen Mody, [facility]) F/U CHF and afib. Noted volume excess. Increased furosemide to 40mg  daily for 2 days (also take 22mEq for 3 days) (if edema continues could consider SGLT2) Lowered dose of amiodarone to 100mg  daily  01/11/2022 - Pulmonology (Dr Shearon Stalls) Prescribed prednisone 40mg  daily for 5 days.    Hospital visits: None in last 6 months   Objective:  Lab Results  Component Value Date   CREATININE 0.96 06/14/2022   CREATININE 0.86 05/07/2022   CREATININE 0.90 05/06/2022    Lab Results  Component Value Date   HGBA1C 6.4 04/19/2022   Last diabetic Eye exam:  Lab Results  Component Value Date/Time   HMDIABEYEEXA No Retinopathy 06/16/2021 01:36 PM    Last diabetic Foot exam: No results found for: "HMDIABFOOTEX"      Component Value Date/Time   CHOL 140 07/13/2021 1336   TRIG 72.0 07/13/2021 1336   HDL 67.00 07/13/2021 1336   CHOLHDL 2 07/13/2021 1336   VLDL 14.4 07/13/2021 1336   LDLCALC 58 07/13/2021 1336   LDLCALC 62 04/11/2021 1421   LDLDIRECT 142.2 12/26/2008 1032       Latest Ref Rng & Units 06/14/2022    1:24 PM 05/07/2022    4:47 AM 04/05/2022   11:15 AM  Hepatic Function  Total Protein 6.5 - 8.1 g/dL 6.6  7.2  6.0   Albumin 3.5 - 5.0 g/dL 4.0  3.8  3.9   AST 15 - 41 U/L 13  19  9    ALT 0 - 44 U/L 15  16  12    Alk Phosphatase 38 - 126 U/L 93  92  102   Total Bilirubin 0.3 - 1.2 mg/dL 0.4  0.6  0.3     Lab Results  Component Value Date/Time   TSH 3.620 04/05/2022 11:15 AM   TSH 4.26 08/03/2021 08:25 AM       Latest Ref Rng & Units 06/14/2022    1:24 PM 05/07/2022    4:47 AM 05/06/2022    1:23 PM  CBC  WBC 4.0 - 10.5 K/uL 7.0  8.0  7.2   Hemoglobin 13.0 - 17.0 g/dL 12.0  12.7  11.6   Hematocrit 39.0 - 52.0 % 38.1  41.3  38.0   Platelets 150 - 400 K/uL 283  282  227     No results found for: "VD25OH"  Clinical ASCVD: Yes  The ASCVD Risk score (Arnett DK, et al., 2019) failed to calculate for the following reasons:   The 2019 ASCVD risk score is  only valid for ages 88 to 11    Other: CHADS2VASc = 5   Social History   Tobacco Use  Smoking Status Former   Packs/day: 1.00   Years: 57.00   Total pack years: 57.00   Types: Cigarettes, Cigars   Quit date: 08/08/2015   Years since quitting: 6.9  Smokeless Tobacco Former   Types: Chew   Quit date: 11/04/1958  Tobacco Comments   Will smoke cigar every once in awhile.  Vaping daily started a couple weeks ago.  04/18/22 hfb   BP Readings from Last 3 Encounters:  06/18/22 125/64  06/14/22 120/70  05/20/22 125/67   Pulse Readings from Last 3 Encounters:  06/18/22 (!) 42  06/14/22 61  05/20/22 64  Wt Readings from Last 3 Encounters:  06/18/22 180 lb 8 oz (81.9 kg)  06/14/22 183 lb 6.4 oz (83.2 kg)  05/20/22 181 lb (82.1 kg)    Assessment: Review of patient past medical history, allergies, medications, health status, including review of consultants reports, laboratory and other test data, was performed as part of comprehensive evaluation and provision of chronic care management services.   SDOH:  (Social Determinants of Health) assessments and interventions performed:      CCM Care Plan  Allergies  Allergen Reactions   Iodine Other (See Comments)    neck swells   Iohexol Swelling    Neck and gland swelling per patient.    Medications Reviewed Today     Reviewed by Cherre Robins, RPH-CPP (Pharmacist) on 07/03/22 at 30  Med List Status: <None>   Medication Order Taking? Sig Documenting Provider Last Dose Status Informant  acetaminophen (TYLENOL) 500 MG tablet [id number] Yes Take 1-2 tablets (500-1,000 mg total) by mouth every 6 (six) hours as needed. Nani Skillern, PA-C Taking Active Multiple Informants, Self, Pharmacy Records  albuterol (PROVENTIL) (2.5 MG/3ML) 0.083% nebulizer solution [id number] Yes Take 3 mLs (2.5 mg total) by nebulization every 6 (six) hours as needed for wheezing or shortness of breath. Spero Geralds, MD Taking Active Multiple  Informants, Self, Pharmacy Records           Med Note [facility], [place] B   Thu Jun 06, 2022 11:35 AM)    albuterol (VENTOLIN HFA) 108 (90 Base) MCG/ACT inhaler [id number] Yes Inhale 2 puffs into the lungs every 6 (six) hours as needed for wheezing or shortness of breath. [name], NP Taking Active Multiple Informants, Self, Pharmacy Records           Med Note [place], [place] B   Thu Jun 06, 2022 11:36 AM)    amiodarone (PACERONE) 200 MG tablet [id number] Yes Take 1/2 tablet (100 mg total) by mouth daily. Liliane Shi, PA-C Taking Active Multiple Informants, Self, Pharmacy Records  atorvastatin (LIPITOR) 80 MG tablet [id number] Yes Take 1 tablet (80 mg total) by mouth daily. Nahser, Wonda Cheng, MD Taking Active Multiple Informants, Self, Pharmacy Records  Budeson-Glycopyrrol-Formoterol [facility] AEROSPHERE) 160-9-4.8 MCG/ACT AERO [id number] Yes Inhale 2 puffs into the lungs in the morning and at bedtime. Spero Geralds, MD Taking Active Multiple Informants, Self, Pharmacy Records           Med Note [facility], [place] B   Mon May 13, 2022 11:45 AM)    furosemide (LASIX) 40 MG tablet [id number] Yes Take 1 tablet (40 mg total) by mouth daily. Nita Sells, MD Taking Active   gabapentin (NEURONTIN) 300 MG capsule [id number] Yes Take 1 capsule (300 mg total) by mouth 3 (three) times daily.  Patient taking differently: Take 300 mg by mouth 2 (two) times daily.   [name], NP Taking Active   levETIRAcetam (KEPPRA XR) 750 MG 24 hr tablet [id number] Yes Take 1 tablet (750 mg total) by mouth at bedtime. Alric Ran, MD Taking Active Multiple Informants, Self, Pharmacy Records    Discontinued 05/08/21 1058 Multiple Vitamin (MULTIVITAMIN WITH MINERALS) TABS tablet [id number] Yes Take 1 tablet by mouth daily in the afternoon. [provider] Taking Active Multiple Informants, Self, Pharmacy Records           Med Note Barbaraann Boys May 13, 2022 11:57 AM)    omeprazole  (PRILOSEC) 40 MG capsule [id number] Yes Take 1 capsule (40 mg total) by mouth  daily. [name], NP Taking Active   potassium chloride (KLOR-CON) 10 MEQ tablet [id number] Yes TAKE 2 TABLETS BY MOUTH DAILY FOR 2 DAYS THEN REDUCE TO 1 TABLET BY MOUTH DAILY Liliane Shi, PA-C Taking Active Multiple Informants, Self, Pharmacy Records  rivaroxaban (XARELTO) 20 MG TABS tablet [id number] Yes TAKE 1 TABLET BY MOUTH DAILY WITH SUPPER Nahser, Wonda Cheng, MD Taking Active   tamsulosin (FLOMAX) 0.4 MG CAPS capsule [id number] Yes TAKE 1 CAPSULE (0.4 MG TOTAL) BY MOUTH DAILY. [name], NP Taking Active             Patient Active Problem List   Diagnosis Date Noted   Pleural effusion 05/20/2022   Aortic dilatation ([place]) 05/13/2022   Dyspnea on exertion 05/06/2022   Thoracic aortic aneurysm ([place]) 04/04/2022   Lower urinary tract symptoms (LUTS) 03/15/2022   Lumbar radiculopathy 03/15/2022   Chronic heart failure with preserved ejection fraction ([place]) 03/15/2022   Diabetic peripheral neuropathy ([place]) 01/15/2022   Unilateral inguinal hernia without obstruction or gangrene 01/15/2022   COPD exacerbation ([place]) 10/03/2021   Persistent atrial fibrillation ([place]) 05/28/2021   Secondary hypercoagulable state ([place]) 05/28/2021   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   Coronary artery disease 05/08/2021   COVID-19 virus infection 04/23/2021   Erectile dysfunction 04/11/2021   Head trauma 03/07/2021   Left leg pain 04/07/2020   Benign prostatic hyperplasia with nocturia 03/08/2020   Seizures ([place]) 02/18/2020   PAF (paroxysmal atrial fibrillation) ([place]) 08/15/2017   Diabetes type 2, controlled ([place]) 07/31/2017   COPD GOLD II  04/03/2017   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[place]) 09/06/2015   Lung cancer ([place]) 11/07/2014   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   Essential hypertension 04/29/2014   Meningioma ([place]) 10/25/2013   Low back pain 10/25/2013   Osteoarthritis 08/18/2012   Atypical  chest pain 08/14/2011   KERATOSIS 10/09/2010   SCIATICA, RIGHT 10/09/2010   UNSPECIFIED HEARING LOSS 05/21/2010   Hyperlipidemia LDL goal <70 05/14/2010   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   RENAL CYST, RIGHT 02/05/2010   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[place]) 01/29/2010   LIPOMA 11/17/2009   MICROSCOPIC HEMATURIA 08/11/2008   GERD 07/26/2008   DERMOID CYST 07/25/2008    Immunization History  Administered Date(s) Administered   Fluad Quad(high Dose 65+) 07/23/2019, 09/04/2020, 07/13/2021   Influenza Split 08/02/2011, 08/18/2012   Influenza Whole 08/17/2008   Influenza, High Dose Seasonal PF 08/09/2015, 09/30/2016, 07/30/2017, 08/07/2018   Influenza,inj,Quad PF,6+ Mos 07/27/2013, 08/09/2014   PFIZER Comirnaty(Gray Top)Covid-19 Tri-Sucrose Vaccine 02/16/2021   PFIZER(Purple Top)SARS-COV-2 Vaccination 01/28/2020, 02/21/2020, 09/04/2020   Pfizer Covid-19 Vaccine Bivalent Booster 67yrs & up 08/03/2021   Pneumococcal Conjugate-13 08/09/2014   Pneumococcal Polysaccharide-23 05/14/2010   Td 07/25/2008    Conditions to be addressed/monitored: Atrial Fibrillation, CHF, HTN, HLD, COPD, and Gout  Care Plan : General Pharmacy (Adult)  Updates made by Cherre Robins, RPH-CPP since 07/03/2022 12:00 AM     Problem: Management of chronic medicaiton conditions and medication: Atrial Fibrillation, HTN, HLD, type 2 DM, Pulmonary Disease and BPH; seizures; h/o lung cancer and meningioma; GERD; aortic aneurysm   Priority: High  Onset Date: 02/08/2021  Note:   Current Barriers:  Does not adhere to prescribed medication regimen Management of chronic medical conditions and medication management Unable to afford medication therapy - improved Education needed regarding chronic conditions and self management  Pharmacist Clinical Goal(s):  Over the next 180 days, patient will achieve adherence to monitoring guidelines and medication adherence to achieve therapeutic efficacy  contact provider office for  questions/concerns as evidenced notation of same in electronic health record through collaboration with PharmD and provider.  Provide education regarding checking weight and monitoring for signs and symptoms of fluid retention.  Provide education regarding proper inhaler use - maintenance versus rescue.   Interventions: 1:1 collaboration with [name], NP regarding development and update of comprehensive plan of care as evidenced by provider attestation and co-signature Inter-disciplinary care team collaboration (see longitudinal plan of care) Comprehensive medication review performed; medication list updated in electronic medical record  Diabetes: Controlled; A1c goal < 7.0% Lab Results  Component Value Date   HGBA1C 6.4 04/19/2022  Current treatment:  none Current glucose readings: does not check BG at home Current meal patterns: eating a little better since CABG 05/2021. Reports he has difficulty resisting cherry pie Patient reports he has seen optometrist 3 months ago but he does not feel that he can see as well with his new glasses.  Noted to have neuropathy at last visit. Gabapentin started 300mg  3 times a day. Patient states he is mostly taking 2 times a day. Denies drowsiness. Reports improved nerve related pain.  Interventions:   Educated on limiting foods containing carbohydrates and sugar; recommended increase non starchy vegetables Counseled on A1c goal.  Patient encouraged to follow up with optometrist regarding vision and new glasses Continue gabapentin 300mg  2 to 3 times a day - warned patient not to drive if drowsy.  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 Also has not been taking potassium supplement 11/29/2021 - he reported that he is taking furosemide some day when he is not working. This past week took Saturday, Sunday and Monday night. Reports that breathing and abdominal fullness has improved. He reports his weight is down about 9 lbs. Per patient (last week 180 lbs and today 171lbs)   Recent hospitaliztion for plural effusion 07/03 to 05/08/2022. Patient states he is taking furosemide daily since he left hospital 05/08/2022 Patient checks BP at home infrequently - no readings provided EF was 60 to 65% 04/2021 Denies hypotensive/hypertensive symptoms Reports occasional LEE (greater in evening)  Has not been checking weight regularly at home.  Interventions:  Educated on  blood pressure goals Continue to take furosemide and potassium  Contact office if you have worsening abdominal fullness or any of these other symptoms. Weight gain, shortness of breath, abdominal fullness, swelling in legs or abdomen, Fatigue and weakness, changes in ability to perform usual activities, persistent cough or wheezing with white or pink blood-tinged mucus, nausea and lack of appetite Restart checking weight daily - report weight gain of more than 2 to 3 lbs in 24 hours or 5 lbs in 1 week Discussed low sodium diet.    Hyperlipidemia / CAD (LDL < 70) Controlled Lab Results  Component Value Date   CHOL 140 07/13/2021   HDL 67.00 07/13/2021   LDLCALC 58 07/13/2021   LDLDIRECT 142.2 12/26/2008   TRIG 72.0 07/13/2021   CHOLHDL 2 07/13/2021  CABG x 3 vessels 05/08/2021.   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 Recommended try to increase intake of non starchy vegetables Recommended continue current therapy.  Consider recheck lipids with next labs  Chronic Obstructive Pulmonary Disease: Controlled COPD monitored by Dr Shearon Stalls - last visit 01/11/2022 Current treatment:  albuterol inhaler or nebulizer solution use as needed for shortness of breath or wheezing (rescue inhaler) Breztri - inhaler two puffs into lungs twice a day  [place] patient [place] approved 11/13/2021. Patient reports he has received third shipment of Breztri.  Patient confirms he did receive nebulizer but not sure that he wants to keep it since he wife also has one he can use.  GOLD Classification: 2 (FEV1 50-79%) Most recent Pulmonary Function Testing: 05/19/2017 - spirometry FVC 2.84 (73%) FEV1 1.74 (62%) FEV1/FVC 61% Current COPD Classification:  B (CATscore >10, <2 exacerbations/yr)  Has not needed albuterol inhaler or nebulizer in the last 2 weeks Interventions:  Education provided about inhaler technique for [employer]. Reminded patient of instructions to use 2 puffs twice a day and to remember to rinse mouth after each use.  Education provided about rescue and maintenance inhaler Take Breztri - inhaler 2 puffs into lungs twice a day. Remember to rinse mouth after each use.  (Maintenance inhaler - should be taken EVERY day)  Albuterol inhaler or nebulizer solution is your rescue medications - it can be used when you are wheezing. Contact office if you are using albuterol more than 2 times per week.    Atrial Fibrillation: NSR since last hospitalization but did recently change from diltiazem to amiodarone due to being in afib prior to cardiac cath Current rate/rhythm controller:  amiodarone $RemoveBef'200mg'cNtgQgfOjU$  daily Anticoagulant treatment:  Xarelto $Remove'20mg'peNtuTw$  daily with evening meal CHADS2VASc score: 5  Interventions: (addressed at previous visit) Recommended continue current  regimen Patient to have LFTs and TSH monitored by cardiologist regularly due to amiodarone therapy Discussed patient assistance program for xarelto - cannot apply until reaches coverage gap and spends 4% of income out of pocket.  Performed medication cost assessment through [employer]; Patient will likely reach Medicare coverage gap in August 2023.  Take Xarelto once a day with evening meal - improves absorption. Patient signed medication assistance program application today for Xarelto. Forwarded to PCP to review and sign.   GERD:  Patient is currently controlled on the following medications:  Omeprazole $RemoveBef'40mg'VILlnZlGxK$  daily Patient has failed these meds in past: None noted  Triggers: laying down after eating Interventions: (addressed at previous visit)  Recommend continue current medications     Patient Goals/Self-Care Activities Over the next 90 days, patient will:  take medications as prescribed and collaborate with provider on medication access solutions Restart checking weight daily - report weight gain of more than 2 to 3 lbs in 24 hours or 5 lbs in 1 week; Contact office if you have worsening abdominal fullness or any of these other symptoms. Weight gain, shortness of breath, abdominal fullness, swelling in legs or abdomen, Fatigue and weakness, changes in ability to perform usual activities, persistent cough or wheezing with white or pink blood-tinged mucus, nausea and lack of appetite Take Breztri - inhaler 2 puffs into lungs twice a day. Remember to rinse mouth after each use.  (Maintenance inhaler - should be taken EVERY day)  Albuterol inhaler or nebulizer solution is your rescue medications -  it can be used when you are wheezing. Contact office if you are using albuterol more than 2 times per week.  Take Xarelto once a day with evening meal - improves absorption.  Also take last dose of gabapentin at bedtime and take levetiracetam 750mg  at bedtime.   Follow Up Plan: Telephone follow up  appointment with care management team member scheduled for:  Clinical Pharmacist Practitioner in 6 to 8 weeks                    Medication Assistance:  Breztri obtained through [facility] and Me medication assistance program.  Enrollment ends 11/03/2022  .   Medication assistance program application in process for Xarelto.    Patient's preferred pharmacy is:  [facility] [address], [place] [address] Phone: [phone] Fax: [phone]   Follow Up:  Patient agrees to Care Plan and Follow-up.  Plan: Telephone follow up appointment with care management team member scheduled for:   1 to 2 weeks clinical pharmacist.  .   Cherre Robins, PharmD Clinical Pharmacist [facility]

## 2022-07-03 NOTE — Patient Instructions (Signed)
Mr. Stogsdill It was a pleasure speaking with you today.  Below is a summary of your health goals and summary of our recent visit. You can also view your updated Chronic Care Management Care plan through your MyChart account.   Take Xarelto once a day with evening meal - improves absorption. We are sending off your application for xarelto medication assistance program.     As always if you have any questions or concerns especially regarding medications, please feel free to contact me either at the phone number below or with a MyChart message.   Keep up the good work!  Cherre Robins, PharmD Clinical Pharmacist [place] [phone] (direct line)  [phone] (main office number)   The patient verbalized understanding of instructions, educational materials, and care plan provided today and DECLINED offer to receive copy of patient instructions, educational materials, and care plan.

## 2022-07-04 ENCOUNTER — Other Ambulatory Visit (HOSPITAL_BASED_OUTPATIENT_CLINIC_OR_DEPARTMENT_OTHER): Payer: Self-pay

## 2022-07-04 DIAGNOSIS — I4819 Other persistent atrial fibrillation: Secondary | ICD-10-CM

## 2022-07-04 DIAGNOSIS — I2510 Atherosclerotic heart disease of native coronary artery without angina pectoris: Secondary | ICD-10-CM

## 2022-07-04 DIAGNOSIS — J449 Chronic obstructive pulmonary disease, unspecified: Secondary | ICD-10-CM

## 2022-07-04 DIAGNOSIS — I110 Hypertensive heart disease with heart failure: Secondary | ICD-10-CM | POA: Diagnosis not present

## 2022-07-05 ENCOUNTER — Other Ambulatory Visit (HOSPITAL_BASED_OUTPATIENT_CLINIC_OR_DEPARTMENT_OTHER): Payer: Self-pay

## 2022-07-05 ENCOUNTER — Telehealth: Payer: Medicare Other

## 2022-07-13 ENCOUNTER — Other Ambulatory Visit: Payer: Self-pay

## 2022-07-13 ENCOUNTER — Emergency Department (HOSPITAL_COMMUNITY)
Admission: EM | Admit: 2022-07-13 | Discharge: 2022-07-13 | Disposition: A | Discharge status: Home / Self Care | Payer: Medicare Other | Attending: Emergency Medicine | Admitting: Emergency Medicine

## 2022-07-13 ENCOUNTER — Encounter (HOSPITAL_COMMUNITY): Payer: Self-pay

## 2022-07-13 ENCOUNTER — Emergency Department (HOSPITAL_COMMUNITY): Payer: Medicare Other

## 2022-07-13 DIAGNOSIS — R0789 Other chest pain: Secondary | ICD-10-CM | POA: Diagnosis not present

## 2022-07-13 DIAGNOSIS — Z85118 Personal history of other malignant neoplasm of bronchus and lung: Secondary | ICD-10-CM | POA: Diagnosis not present

## 2022-07-13 DIAGNOSIS — R0602 Shortness of breath: Secondary | ICD-10-CM | POA: Diagnosis not present

## 2022-07-13 DIAGNOSIS — Z20822 Contact with and (suspected) exposure to covid-19: Secondary | ICD-10-CM | POA: Diagnosis not present

## 2022-07-13 DIAGNOSIS — I4891 Unspecified atrial fibrillation: Secondary | ICD-10-CM | POA: Insufficient documentation

## 2022-07-13 DIAGNOSIS — Z7901 Long term (current) use of anticoagulants: Secondary | ICD-10-CM | POA: Insufficient documentation

## 2022-07-13 DIAGNOSIS — Z743 Need for continuous supervision: Secondary | ICD-10-CM | POA: Diagnosis not present

## 2022-07-13 DIAGNOSIS — R079 Chest pain, unspecified: Secondary | ICD-10-CM | POA: Diagnosis not present

## 2022-07-13 DIAGNOSIS — J449 Chronic obstructive pulmonary disease, unspecified: Secondary | ICD-10-CM | POA: Insufficient documentation

## 2022-07-13 DIAGNOSIS — I110 Hypertensive heart disease with heart failure: Secondary | ICD-10-CM | POA: Insufficient documentation

## 2022-07-13 DIAGNOSIS — I509 Heart failure, unspecified: Secondary | ICD-10-CM | POA: Diagnosis not present

## 2022-07-13 DIAGNOSIS — I2510 Atherosclerotic heart disease of native coronary artery without angina pectoris: Secondary | ICD-10-CM | POA: Diagnosis not present

## 2022-07-13 DIAGNOSIS — E119 Type 2 diabetes mellitus without complications: Secondary | ICD-10-CM | POA: Insufficient documentation

## 2022-07-13 DIAGNOSIS — R110 Nausea: Secondary | ICD-10-CM | POA: Diagnosis not present

## 2022-07-13 DIAGNOSIS — R0781 Pleurodynia: Secondary | ICD-10-CM | POA: Insufficient documentation

## 2022-07-13 LAB — COMPREHENSIVE METABOLIC PANEL
ALT: 14 U/L (ref 0–44)
AST: 14 U/L — ABNORMAL LOW (ref 15–41)
Albumin: 3.5 g/dL (ref 3.5–5.0)
Alkaline Phosphatase: 89 U/L (ref 38–126)
Anion gap: 7 (ref 5–15)
BUN: 18 mg/dL (ref 8–23)
CO2: 26 mmol/L (ref 22–32)
Calcium: 8.9 mg/dL (ref 8.9–10.3)
Chloride: 105 mmol/L (ref 98–111)
Creatinine, Ser: 0.9 mg/dL (ref 0.61–1.24)
GFR, Estimated: >60 mL/min (ref >60)
Glucose, Bld: 144 mg/dL — ABNORMAL HIGH (ref 70–99)
Potassium: 4 mmol/L (ref 3.5–5.1)
Sodium: 138 mmol/L (ref 135–145)
Total Bilirubin: 0.6 mg/dL (ref 0.3–1.2)
Total Protein: 7.1 g/dL (ref 6.5–8.1)

## 2022-07-13 LAB — CBC WITH DIFFERENTIAL/PLATELET
Abs Immature Granulocytes: 0.01 10*3/uL (ref 0.00–0.07)
Basophils Absolute: 0 10*3/uL (ref 0.0–0.1)
Basophils Relative: 0 %
Eosinophils Absolute: 0.1 10*3/uL (ref 0.0–0.5)
Eosinophils Relative: 1 %
HCT: 39.6 % (ref 39.0–52.0)
Hemoglobin: 12.3 g/dL — ABNORMAL LOW (ref 13.0–17.0)
Immature Granulocytes: 0 %
Lymphocytes Relative: 10 %
Lymphs Abs: 1 10*3/uL (ref 0.7–4.0)
MCH: 27.8 pg (ref 26.0–34.0)
MCHC: 31.1 g/dL (ref 30.0–36.0)
MCV: 89.4 fL (ref 80.0–100.0)
Monocytes Absolute: 1.2 10*3/uL — ABNORMAL HIGH (ref 0.1–1.0)
Monocytes Relative: 12 %
Neutro Abs: 7.8 10*3/uL — ABNORMAL HIGH (ref 1.7–7.7)
Neutrophils Relative %: 77 %
Platelets: 248 10*3/uL (ref 150–400)
RBC: 4.43 MIL/uL (ref 4.22–5.81)
RDW: 15.8 % — ABNORMAL HIGH (ref 11.5–15.5)
WBC: 10.2 10*3/uL (ref 4.0–10.5)
nRBC: 0 % (ref 0.0–0.2)

## 2022-07-13 LAB — TROPONIN I (HIGH SENSITIVITY)
Troponin I (High Sensitivity): 14 ng/L (ref <18)
Troponin I (High Sensitivity): 15 ng/L (ref <18)

## 2022-07-13 LAB — SARS CORONAVIRUS 2 BY RT PCR: SARS Coronavirus 2 by RT PCR: NEGATIVE

## 2022-07-13 MED ORDER — KETOROLAC TROMETHAMINE 30 MG/ML IJ SOLN
30.0000 mg | Freq: Once | INTRAMUSCULAR | Status: AC
  Administered 2022-07-13: 30 mg via INTRAVENOUS
  Filled 2022-07-13: qty 1

## 2022-07-13 NOTE — ED Triage Notes (Signed)
BIBA from home for shob x2 days with diffuse pain with inhalation.  Hx open heart surgery.  Pt reports taking lasix and having fluid removed 1 month ago. Pt denies missing any doses.  Ems admin 324mg  ASA Pt on xarelto

## 2022-07-13 NOTE — ED Provider Notes (Signed)
[facility] DEPT Provider Note   CSN: [id number] Arrival date & time: 07/13/22  1051     History  Chief Complaint  Patient presents with   Shortness of Breath    Robert Bates is a 81 y.o. male.  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  Pain started on Thursday, reminds him of when he had fluid around his lungs in July and had thoracentesis     Pain with breathing started Thursday, pain upper chest, worse with neck movements and improved. Was hurting moving it side to side Low chest with breathing it hurts, similar to when had fluid around lungs, because of pain feels like can't take deep breaths. Admits to liking hot dogs, hamburgers, cherry pie, foods that might make heart failure worse. Has not missed doses in the last 2 weeks of medication. Have not noticed any significant recent leg pain or swelling Taking xarelto, lasix Thursday has had nausea, no vomiting No abdominal pain with exception of trying to button britches No worsening cough, no fever  Sees Dr. Earlie Server Dr. 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   COPD (chronic obstructive pulmonary disease) (HCC)    Coronary artery disease    Diabetes type 2, controlled ([place]) 07/31/2017   Dyspnea    Dysrhythmia    a fib   GERD (gastroesophageal reflux disease)     Hematuria    refuses work up or referral - understands risks of morbidity / mortality - 11/2008, 12/2008   History of hiatal hernia    History of kidney stones    Hyperlipemia    Meningioma ([place]) 10/25/2013   Follows with Dr. [name].    Peripheral vascular disease ([place])    Abdominal Aortic Aneursym   Pneumonia    as a child   Radiation 09/18/15-10/25/15   left lower lobe 70.2 Gy   Seizures ([place]) 02/18/2020   Tobacco abuse      Home Medications Prior to Admission medications   Medication Sig Start Date End Date Taking? Authorizing Provider  acetaminophen (TYLENOL) 500 MG tablet Take 1-2 tablets (500-1,000 mg total) by mouth every 6 (six) hours as needed. 05/16/21   Nani Skillern, PA-C  albuterol (PROVENTIL) (2.5 MG/3ML) 0.083% nebulizer solution Take 3 mLs (2.5 mg total) by nebulization every 6 (six) hours as needed for wheezing or shortness of breath. 10/17/21   Spero Geralds, MD  albuterol (VENTOLIN HFA) 108 (90 Base) MCG/ACT inhaler Inhale 2 puffs into the lungs every 6 (six) hours as needed for wheezing or shortness of breath. 07/27/21   [name], NP  amiodarone (PACERONE) 200 MG tablet Take 1/2 tablet (100 mg total) by mouth daily. 04/05/22   Richardson Dopp T, PA-C  atorvastatin (LIPITOR) 80 MG tablet Take 1 tablet (80 mg total) by mouth daily. 11/09/21   Nahser, Wonda Cheng, MD  Budeson-Glycopyrrol-Formoterol [name]  AEROSPHERE) 160-9-4.8 MCG/ACT AERO Inhale 2 puffs into the lungs in the morning and at bedtime. 10/17/21   Spero Geralds, MD  furosemide (LASIX) 40 MG tablet Take 1 tablet (40 mg total) by mouth daily. 05/09/22   Nita Sells, MD  gabapentin (NEURONTIN) 300 MG capsule Take 1 capsule (300 mg total) by mouth 3 (three) times daily. Patient taking differently: Take 300 mg by mouth 2 (two) times daily. 05/09/22   [name], NP  levETIRAcetam (KEPPRA XR) 750 MG 24 hr tablet Take 1 tablet (750 mg total) by mouth at bedtime. 04/12/22   Alric Ran, MD  Multiple Vitamin (MULTIVITAMIN WITH MINERALS) TABS tablet Take 1 tablet by mouth daily in the afternoon.    [provider]  omeprazole (PRILOSEC) 40 MG capsule Take 1 capsule (40 mg total) by mouth daily. 05/08/22   [name], NP  potassium chloride (KLOR-CON) 10 MEQ tablet TAKE 2 TABLETS BY MOUTH DAILY FOR 2 DAYS THEN REDUCE TO 1 TABLET BY MOUTH DAILY 04/05/22   Richardson Dopp T, PA-C  rivaroxaban (XARELTO) 20 MG TABS tablet TAKE 1 TABLET BY MOUTH DAILY WITH SUPPER 06/20/22   Nahser, Wonda Cheng, MD  tamsulosin (FLOMAX) 0.4 MG CAPS capsule TAKE 1 CAPSULE (0.4 MG TOTAL) BY MOUTH DAILY. 05/09/22 05/09/23  [name], NP  metFORMIN (GLUCOPHAGE) 500 MG tablet Take 1 tablet (500 mg total) by mouth 2 (two) times daily with a meal. 04/26/21 05/08/21  Margie Billet, NP      Allergies    Iodine and Iohexol    Review of Systems   Review of Systems  Physical Exam Updated Vital Signs BP 119/80   Pulse 75   Temp 98.3 F (36.8 C)   Resp (!) 26   Ht 5\' 7"  (1.702 m)   Wt 81 kg   SpO2 93%   BMI 27.97 kg/m  Physical Exam Vitals and nursing note reviewed.  Constitutional:      General: He is not in acute distress.    Appearance: He is well-developed. He is not diaphoretic.  HENT:     Head: Normocephalic and atraumatic.  Eyes:     Conjunctiva/sclera: Conjunctivae normal.  Cardiovascular:     Rate and Rhythm: Normal rate and regular rhythm.     Heart sounds: Normal heart sounds. No murmur heard.    No friction rub. No gallop.  Pulmonary:     Effort: Pulmonary effort is normal. No respiratory distress.     Breath sounds: Normal breath sounds. No wheezing or rales.     Comments: Left chest wall tenderness Abdominal:     General: There is no distension.     Palpations: Abdomen is soft.     Tenderness: There is no abdominal tenderness. There is no guarding.  Musculoskeletal:     Cervical back: Normal range of motion.  Skin:    General: Skin is warm and dry.   Neurological:     Mental Status: He is alert and oriented to person, place, and time.     ED Results / Procedures / Treatments   Labs (all labs ordered are listed, but only abnormal results are displayed) Labs Reviewed  CBC WITH DIFFERENTIAL/PLATELET - Abnormal; Notable for the following components:      Result Value   Hemoglobin 12.3 (*)    RDW 15.8 (*)    Neutro Abs 7.8 (*)    Monocytes Absolute 1.2 (*)    All other components within normal limits  COMPREHENSIVE METABOLIC PANEL - Abnormal;  Notable for the following components:   Glucose, Bld 144 (*)    AST 14 (*)    All other components within normal limits  SARS CORONAVIRUS 2 BY RT PCR  TROPONIN I (HIGH SENSITIVITY)  TROPONIN I (HIGH SENSITIVITY)    EKG EKG Interpretation  Date/Time:  Saturday July 13 2022 11:45:45 EDT Ventricular Rate:  78 PR Interval:  126 QRS Duration: 100 QT Interval:  379 QTC Calculation: 432 R Axis:   -14 Text Interpretation: Sinus rhythm RSR' in V1 or V2, probably normal variant Minimal ST elevation, inferior leads No significant change since last tracing Confirmed by Gareth Morgan [phone]) on 07/13/2022 1:55:01 PM  Radiology DG Chest Portable 1 View  Result Date: 07/13/2022 CLINICAL DATA:  Shortness of breath. EXAM: PORTABLE CHEST 1 VIEW COMPARISON:  05/07/2022 FINDINGS: The cardiomediastinal silhouette is unchanged with evidence of CABG and LEFT atrial clip. Scattered LEFT lung atelectasis/scarring again noted. There is no evidence of focal airspace disease, pulmonary edema, suspicious pulmonary nodule/mass, pleural effusion, or pneumothorax. No acute bony abnormalities are identified. IMPRESSION: No evidence of acute cardiopulmonary disease. Electronically Signed   By: Margarette Canada M.D.   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  Differential diagnosis for chest pain includes pulmonary embolus, dissection, pneumothorax, pneumonia, ACS, myocarditis, pericarditis, pleurisy/pleural effusion.  EKG was done and evaluate by me and showed no acute ST changes and no signs of pericarditis. Chest x-ray was done and evaluated by me and radiology and showed no sign of pneumonia,pleural effusion, edema or pneumothorax. Similar scarring to the past.  He has been taking xarelto as prescribed and have low suspicion for PE. Delta troponins are both negative and doubt ACS. No exertional symptoms. Do not feel history or exam are consistent with aortic dissection.  Pain is worse with movements as well as cough, suspect may be muscular pain. Improved with toradol. No wheezing or signs of COPD exacerbation.  Recommend follow up with his pulmonologist as well as PCP.           Final Clinical Impression(s) / ED Diagnoses Final diagnoses:  Pleuritic pain    Rx / DC Orders ED Discharge Orders     None         Gareth Morgan, MD 07/13/22 2158

## 2022-07-15 ENCOUNTER — Other Ambulatory Visit (HOSPITAL_BASED_OUTPATIENT_CLINIC_OR_DEPARTMENT_OTHER): Payer: Self-pay

## 2022-07-15 ENCOUNTER — Telehealth: Payer: Self-pay

## 2022-07-15 ENCOUNTER — Telehealth: Payer: Self-pay | Admitting: Family

## 2022-07-15 ENCOUNTER — Ambulatory Visit: Payer: Medicare Other | Admitting: Family

## 2022-07-15 NOTE — Telephone Encounter (Signed)
Pt wanted to speak to Tammy regarding the cost of his medication. Advised him he has an appt on the 14th but he wanted a call today.

## 2022-07-15 NOTE — Telephone Encounter (Signed)
Why don't you have him come at 12 please. Advise him that he will likely have to wait a bit.

## 2022-07-15 NOTE — Telephone Encounter (Signed)
Pt called stating that he was looking to get meds to help with the pain he is experiencing. Pt was in the ED on 9.9.23 for Pleuritic pain. Pt was advised that any new meds would need a follow up appt. Pt stated that he wanted to see Melissa. Advised that Melissa didn't have an available appt until 9.12.23 @ 1:40p. Pt stated that was too long and wanted a note sent back to see if Lenna Sciara could work him in today.

## 2022-07-15 NOTE — Telephone Encounter (Signed)
Patient scheduled to come in today

## 2022-07-15 NOTE — Telephone Encounter (Signed)
Patient evaluated/treated in ER.   [name] Night - Client TELEPHONE ADVICE RECORD AccessNurse [place] Chief Complaint BREATHING - shortness of breath or sounds breathless Reason for Call Symptomatic / Request for [name] states that he thinks he has fluid on his lungs. He is having trouble breathing. Translation No Nurse Assessment Nurse: D'Heur Lucia Gaskins, RN, Adrienne Date/Time (Eastern Time): 07/13/2022 9:29:10 AM Confirm and document reason for call. If symptomatic, describe symptoms. ---Caller states that he thinks he has fluid on his lungs. He is having trouble breathing. He has fluid drained from his lungs since his open heart surgery (4x so far). Dyspnea started Thursday. He was cold this morning but not now. No fever. Does the patient have any new or worsening symptoms? ---Yes Will a triage be completed? ---Yes Related visit to physician within the last 2 weeks? ---No Does the PT have any chronic conditions? (i.e. diabetes, asthma, this includes High risk factors for pregnancy, etc.) ---Yes List chronic conditions. ---Hx of open heart sx, a-fib, high cholesterol, COPD Is this a behavioral health or substance abuse call? ---No Guidelines Guideline Title Affirmed Question Affirmed Notes Nurse Date/Time (Eastern Time) Breathing Difficulty SEVERE difficulty breathing (e.g., struggling for each breath, speaks in single words) D'Heur Lucia Gaskins, RN, [place] 07/13/2022 9:33:59 AM PLEASE NOTE: All timestamps contained within this report are represented as [place] Standard Time. CONFIDENTIALTY NOTICE: This fax transmission is intended only for the addressee. It contains information that is legally privileged, confidential or otherwise protected from use or disclosure. If you are not the intended recipient, you are strictly prohibited from reviewing, disclosing, copying using or disseminating any of this  information or taking any action in reliance on or regarding this information. If you have received this fax in error, please notify us immediately by telephone so that we can arrange for its return to [place]. Phone: [phone], Toll-Free: [phone], Fax: [phone] Page: 2 of 2 Call Id: [id number] Robert Bates. Time Eilene Ghazi Time) Disposition Final User 07/13/2022 9:27:47 AM Send to Urgent Queue Windy Canny 07/13/2022 9:34:56 AM Call EMS 911 Now Yes D'Heur Lucia Gaskins, RN, Adrienne 07/13/2022 9:42:01 AM 911 Outcome Documentation [place], RN, Vincente [place] Reason: Patient calling now. Final Disposition 07/13/2022 9:34:56 AM Call EMS 911 Now Yes D'Heur Lucia Gaskins, RN, Ebbie Latus Disagree/Comply Comply Caller Understands Yes PreDisposition Call Doctor Care Advice Given Per Guideline CALL EMS 911 NOW: * Immediate medical attention is needed. You need to hang up and call 911 (or an ambulance). CARE ADVICE given per Breathing Difficulty (Adult) guideline

## 2022-07-16 ENCOUNTER — Telehealth: Payer: Self-pay | Admitting: Cardiovascular Disease

## 2022-07-16 ENCOUNTER — Ambulatory Visit (INDEPENDENT_AMBULATORY_CARE_PROVIDER_SITE_OTHER): Payer: Medicare Other | Admitting: Family

## 2022-07-16 VITALS — BP 115/62 | HR 81 | Temp 97.9°F | Resp 16 | Wt 183.0 lb

## 2022-07-16 DIAGNOSIS — R0781 Pleurodynia: Secondary | ICD-10-CM

## 2022-07-16 DIAGNOSIS — I480 Paroxysmal atrial fibrillation: Secondary | ICD-10-CM

## 2022-07-16 DIAGNOSIS — M7918 Myalgia, other site: Secondary | ICD-10-CM | POA: Diagnosis not present

## 2022-07-16 DIAGNOSIS — C3432 Malignant neoplasm of lower lobe, left bronchus or lung: Secondary | ICD-10-CM

## 2022-07-16 DIAGNOSIS — H905 Unspecified sensorineural hearing loss: Secondary | ICD-10-CM | POA: Diagnosis not present

## 2022-07-16 NOTE — Progress Notes (Signed)
Subjective:   By signing my name below, I, Robert Bates, attest that this documentation has been prepared under the direction and in the presence of [place], NP 07/16/2022   Patient ID: Robert Bates, male    DOB: 1941-01-15, 81 y.o.   MRN: [id number]  No chief complaint on file.   HPI Patient is in today for an office visit  Abdominal Pain/ED Admission: He was admitted to the ED on 07/13/2022 for pleuritic pain. He was discharged on the same day. Since his ED discharge, the pain in his abdomen is persistent. When laying down at night and getting up to use the restroom, his chest feels like it will explode. Pain also increases when he is inhaling a deep breath. He does not recall a time where he fell and injured his left rib.  Xarelto: He reports that he has lost his 20 Mg of his Xarelto medication.   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   COPD (chronic obstructive pulmonary disease) (HCC)    Coronary artery disease    Diabetes type 2, controlled ([place]) 07/31/2017   Dyspnea    Dysrhythmia    a fib   GERD (gastroesophageal reflux disease)    Hematuria    refuses work up or referral - understands risks of morbidity / mortality - 11/2008, 12/2008   History of hiatal hernia    History of kidney stones    Hyperlipemia    Meningioma ([place]) 10/25/2013   Follows with Dr. [name].    Peripheral vascular disease ([place])    Abdominal Aortic Aneursym    Pneumonia    as a child   Radiation 09/18/15-10/25/15   left lower lobe 70.2 Gy   Seizures ([place]) 02/18/2020   Tobacco abuse     Past Surgical History:  Procedure Laterality Date   CHOLECYSTECTOMY N/A 07/23/2017   Procedure: LAPAROSCOPIC CHOLECYSTECTOMY;  Surgeon: Mickeal Skinner, MD;  Location: [facility];  Service: General;  Laterality: N/A;   CLIPPING OF ATRIAL APPENDAGE Left 05/08/2021   Procedure: CLIPPING OF ATRIAL APPENDAGE USING 70 ATRICLIP;  Surgeon: Wonda Olds, MD;  Location: [facility];  Service: Open Heart Surgery;  Laterality: Left;   COLONOSCOPY     CORONARY ARTERY BYPASS GRAFT N/A 05/08/2021   Procedure: CORONARY ARTERY BYPASS GRAFTING (CABG)X 3 USING LEFT INTERNAL MAMMARY ARTERY AND RIGHT GREATER SAPEHNOUS VEIN;  Surgeon: Wonda Olds, MD;  Location: [facility];  Service: Open Heart Surgery;  Laterality: N/A;   ENDOVEIN HARVEST OF GREATER SAPHENOUS VEIN Right 05/08/2021   Procedure: ENDOVEIN HARVEST OF GREATER SAPHENOUS VEIN;  Surgeon: Wonda Olds, MD;  Location: [facility];  Service: Open Heart Surgery;  Laterality: Right;   EYE SURGERY Bilateral    Cataracts removed w/ lens implant   HERNIA REPAIR     Left 36 years ago . Right inguinal hernia repair 10-01-17 Dr. Kieth Brightly  INGUINAL HERNIA REPAIR Right 10/01/2017   Procedure: RIGHT INGUINAL HERNIA REPAIR WITH MESH;  Surgeon: Kinsinger, Arta Bruce, MD;  Location: [facility];  Service: General;  Laterality: Right;  TAP BLOCK   INSERTION OF MESH Right 10/01/2017   Procedure: INSERTION OF MESH;  Surgeon: Kieth Brightly Arta Bruce, MD;  Location: [facility];  Service: General;  Laterality: Right;   IR THORACENTESIS ASP PLEURAL SPACE W/IMG GUIDE  05/18/2021   IR THORACENTESIS ASP PLEURAL SPACE W/IMG GUIDE  06/07/2021   LEFT HEART CATH AND CORONARY ANGIOGRAPHY N/A 04/20/2021   Procedure: LEFT HEART CATH AND CORONARY ANGIOGRAPHY;  Surgeon: Wellington Hampshire, MD;  Location: [facility];  Service: Cardiovascular;  Laterality: N/A;   TEE  WITHOUT CARDIOVERSION N/A 05/08/2021   Procedure: TRANSESOPHAGEAL ECHOCARDIOGRAM (TEE);  Surgeon: Wonda Olds, MD;  Location: [facility];  Service: Open Heart Surgery;  Laterality: N/A;   TONSILLECTOMY     TONSILLECTOMY     VIDEO BRONCHOSCOPY Bilateral 07/26/2015   Procedure: VIDEO BRONCHOSCOPY WITH FLUORO;  Surgeon: [name], MD;  [facility];  Service: Cardiopulmonary;  Laterality: Bilateral;   VIDEO BRONCHOSCOPY WITH ENDOBRONCHIAL NAVIGATION N/A 08/23/2015   Procedure: VIDEO BRONCHOSCOPY WITH ENDOBRONCHIAL NAVIGATION;  Surgeon: Grace Isaac, MD;  Location: [facility];  Service: Thoracic;  Laterality: N/A;   VIDEO BRONCHOSCOPY WITH ENDOBRONCHIAL ULTRASOUND N/A 08/23/2015   Procedure: VIDEO BRONCHOSCOPY WITH ENDOBRONCHIAL ULTRASOUND;  Surgeon: Grace Isaac, MD;  Location: [facility];  Service: Thoracic;  Laterality: N/A;    Family History  Problem Relation Age of Onset   Leukemia Father    Emphysema Father    Learning disabilities Son    Atrial fibrillation Son    Leukemia Other    Stroke Other     Social History   Socioeconomic History   Marital status: Married    Spouse name: Not on file   Number of children: 2   Years of education: Not on file   Highest education level: Not on file  Occupational History   Occupation: Retired    Fish farm manager: [employer]    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[employer]  Tobacco Use   Smoking status: Former    Packs/day: 1.00    Years: 57.00    Total pack years: 57.00    Types: Cigarettes, Cigars    Quit date: 08/08/2015    Years since quitting: 6.9   Smokeless tobacco: Former    Types: Chew    Quit date: 11/04/1958   Tobacco comments:    Will smoke cigar every once in awhile.  Vaping daily started a couple weeks ago.  04/18/22 hfb  Vaping Use   Vaping Use: Former  Substance and Sexual Activity   Alcohol use: Not Currently    Alcohol/week: 0.0 standard drinks of alcohol   Drug use: No   Sexual activity: Not Currently   Other Topics Concern   Not on file  Social History Narrative   Not on file   Social Determinants of Health   Financial Resource Strain: Medium Risk (11/22/2021)   Overall Financial Resource Strain (CARDIA)    Difficulty of Paying Living Expenses: Somewhat hard  Food Insecurity: No Food Insecurity (09/25/2021)   Hunger Vital Sign    Worried About Running Out of Food in the Last Year: Never true    Ran Out of Food in the Last Year: Never true  Transportation Needs: No Transportation Needs (05/13/2022)   PRAPARE - Transportation    Lack of  Transportation (Medical): No    Lack of Transportation (Non-Medical): No  Physical Activity: Insufficiently Active (08/05/2021)   Exercise Vital Sign    Days of Exercise per Week: 2 days    Minutes of Exercise per Session: 40 min  Stress: No Stress Concern Present (10/01/2021)   [place]    Feeling of Stress : Not at all  Social Connections: Moderately Isolated (05/13/2022)   Social Connection and Isolation Panel [NHANES]    Frequency of Communication with Friends and Family: Twice a week    Frequency of Social Gatherings with Friends and Family: Once a week    Attends Religious Services: Never    Marine scientist or Organizations: No    Attends Archivist Meetings: Never    Marital Status: Married  Human resources officer Violence: Not At Risk (10/01/2021)   Humiliation, Afraid, Rape, and Kick questionnaire    Fear of Current or Ex-Partner: No    Emotionally Abused: No    Physically Abused: No    Sexually Abused: No    Outpatient Medications Prior to Visit  Medication Sig Dispense Refill   acetaminophen (TYLENOL) 500 MG tablet Take 1-2 tablets (500-1,000 mg total) by mouth every 6 (six) hours as needed. 30 tablet 0   albuterol (PROVENTIL) (2.5 MG/3ML) 0.083% nebulizer solution Take 3 mLs (2.5 mg total) by nebulization every 6 (six) hours as needed for wheezing or  shortness of breath. 75 mL 12   albuterol (VENTOLIN HFA) 108 (90 Base) MCG/ACT inhaler Inhale 2 puffs into the lungs every 6 (six) hours as needed for wheezing or shortness of breath. 8.5 g 5   amiodarone (PACERONE) 200 MG tablet Take 1/2 tablet (100 mg total) by mouth daily. 45 tablet 3   atorvastatin (LIPITOR) 80 MG tablet Take 1 tablet (80 mg total) by mouth daily. 90 tablet 3   Budeson-Glycopyrrol-Formoterol (BREZTRI AEROSPHERE) 160-9-4.8 MCG/ACT AERO Inhale 2 puffs into the lungs in the morning and at bedtime. 10.7 g 5   furosemide (LASIX) 40 MG tablet Take 1 tablet (40 mg total) by mouth daily. 30 tablet 1   gabapentin (NEURONTIN) 300 MG capsule Take 1 capsule (300 mg total) by mouth 3 (three) times daily. (Patient taking differently: Take 300 mg by mouth 2 (two) times daily.) 90 capsule 1   levETIRAcetam (KEPPRA XR) 750 MG 24 hr tablet Take 1 tablet (750 mg total) by mouth at bedtime. 30 tablet 11   Multiple Vitamin (MULTIVITAMIN WITH MINERALS) TABS tablet Take 1 tablet by mouth daily in the afternoon.     omeprazole (PRILOSEC) 40 MG capsule Take 1 capsule (40 mg total) by mouth daily. 90 capsule 1   potassium chloride (KLOR-CON) 10 MEQ tablet TAKE 2 TABLETS BY MOUTH DAILY FOR 2 DAYS THEN REDUCE TO 1 TABLET BY MOUTH DAILY 92 tablet 3   rivaroxaban (XARELTO) 20 MG TABS tablet TAKE 1 TABLET BY MOUTH DAILY WITH SUPPER 90 tablet 2   tamsulosin (FLOMAX) 0.4 MG CAPS capsule TAKE 1 CAPSULE (0.4 MG TOTAL) BY MOUTH DAILY. 90 capsule 1   No facility-administered medications prior to visit.    Allergies  Allergen Reactions   Iodine Other (See Comments)    neck swells   Iohexol Swelling    Neck and gland swelling per patient.    ROS     Objective:    Physical Exam Constitutional:      General: He is not in acute distress.    Appearance:  Normal appearance. He is not ill-appearing.  HENT:     Head: Normocephalic and atraumatic.     Right Ear: External ear normal.     Left Ear: External  ear normal.  Eyes:     Extraocular Movements: Extraocular movements intact.     Pupils: Pupils are equal, round, and reactive to light.  Cardiovascular:     Rate and Rhythm: Normal rate and regular rhythm.     Heart sounds: Normal heart sounds. No murmur heard.    No gallop.  Pulmonary:     Effort: Pulmonary effort is normal. No respiratory distress.     Breath sounds: Normal breath sounds. No wheezing or rales.  Skin:    General: Skin is warm and dry.  Neurological:     Mental Status: He is alert and oriented to person, place, and time.  Psychiatric:        Mood and Affect: Mood normal.        Behavior: Behavior normal.        Judgment: Judgment normal.     There were no vitals taken for this visit. Wt Readings from Last 3 Encounters:  07/13/22 178 lb 9.2 oz (81 kg)  06/18/22 180 lb 8 oz (81.9 kg)  06/14/22 183 lb 6.4 oz (83.2 kg)       Assessment & Plan:   Problem List Items Addressed This Visit   None  No orders of the defined types were placed in this encounter.   I, Robert Bates, personally preformed the services described in this documentation.  All medical record entries made by the scribe were at my direction and in my presence.  I have reviewed the chart and discharge instructions (if applicable) and agree that the record reflects my personal performance and is accurate and complete. 07/16/2022   I,Amber Collins,acting as a Education administrator for Nance Pear, NP.,have documented all relevant documentation on the behalf of Nance Pear, NP,as directed by  Nance Pear, NP while in the presence of Nance Pear, NP.    [employer]

## 2022-07-16 NOTE — Telephone Encounter (Signed)
Tried to contact patient. No answer. LM on VM.

## 2022-07-16 NOTE — Telephone Encounter (Signed)
Patient calling the office for samples of medication:   1.  What medication and dosage are you requesting samples for? rivaroxaban (XARELTO) 20 MG TABS tablet  2.  Are you currently out of this medication? Pt says that he lost his medication and the pharmacy said he would have to pay out of pocket for any more. Pt would like samples of this until he can get more. Pt states that he can head that way to get them now if you have them, but he would like this taken care of while still in town from another appt.

## 2022-07-16 NOTE — Telephone Encounter (Signed)
Hey Lynn, LPN, can you please advise on this matter? Thanks  ?

## 2022-07-16 NOTE — Telephone Encounter (Signed)
Called pt to inform him that we could leave him 2 bottles of Xarelto 20 mg tablets, 2 weeks, at the front desk for pt to pick up. I advised him that if he has any other problems, questions or concerns, to give our office a call. Pt verbalized understanding. Lot# [id number]   Exp: 08/2023

## 2022-07-16 NOTE — Patient Instructions (Addendum)
Add Tylenol 1000mg  twice daily. Purchase over the counter salon pas patches and apply to left lower rib area 12 hours at  a time. Schedule follow up with Dr. Verlee Monte.  Call Cardiology to see if they have 20mg  Xarelto samples.  Let me know if you are unable to get samples. [phone]

## 2022-07-17 ENCOUNTER — Telehealth: Payer: Self-pay | Admitting: Family

## 2022-07-17 DIAGNOSIS — M7918 Myalgia, other site: Secondary | ICD-10-CM | POA: Insufficient documentation

## 2022-07-17 NOTE — Assessment & Plan Note (Signed)
Advised pt to schedule follow up with his pulmonologist.

## 2022-07-17 NOTE — Assessment & Plan Note (Signed)
Patient lost his xarelto. Insurance will not pay again until next month. Out of pocket cost is $600 which he cannot afford.  Unfortunately, we do not have samples of his dose. I advised him to reach out to his cardiologist to see if they have samples. If not, he is advised to let me know as we may need to bridge him temporarily with coumadin.

## 2022-07-17 NOTE — Assessment & Plan Note (Signed)
>>  ASSESSMENT AND PLAN FOR ATRIAL FIBRILLATION ([facility]) WRITTEN ON 07/17/2022  1:45 PM BY O'SULLIVAN, Nijee Heatwole, NP  Patient lost his xarelto. Insurance will not pay again until next month. Out of pocket cost is $600 which he cannot afford.  Unfortunately, we do not have samples of his dose. I advised him to reach out to his cardiologist to see if they have samples. If not, he is advised to let me know as we may need to bridge him temporarily with coumadin.

## 2022-07-17 NOTE — Telephone Encounter (Signed)
Patient advised and he will give radiology a call to set up a time

## 2022-07-17 NOTE — Assessment & Plan Note (Signed)
New. Will repeat cxr with left sided rib x-ray. Recommended tylenol prn as well as salon pas patches to the affected area.

## 2022-07-17 NOTE — Telephone Encounter (Signed)
It looks like he forgot to complete chest/rib x-ray after his visit yesterday. Please remind him to complete.

## 2022-07-18 ENCOUNTER — Other Ambulatory Visit (HOSPITAL_BASED_OUTPATIENT_CLINIC_OR_DEPARTMENT_OTHER): Payer: Self-pay

## 2022-07-18 ENCOUNTER — Ambulatory Visit (INDEPENDENT_AMBULATORY_CARE_PROVIDER_SITE_OTHER): Payer: Medicare Other | Admitting: Pharmacist

## 2022-07-18 DIAGNOSIS — I5032 Chronic diastolic (congestive) heart failure: Secondary | ICD-10-CM

## 2022-07-18 DIAGNOSIS — I480 Paroxysmal atrial fibrillation: Secondary | ICD-10-CM

## 2022-07-18 NOTE — Chronic Care Management (AMB) (Signed)
Chronic Care Management Pharmacy Note  07/18/2022 Name:  Robert Bates MRN:  [id number] DOB:  05/11/1941  Summary: Afib - Patient lost Xarelto $RemoveBefo'20mg'mMIxjUUItAV$  prescription that was filled 07/05/2022. Cost to replace this Rx was estimated to be $600 because insurance did not cover any portion of cost. Patient did get 14 days of samples from cardiologist office 07/16/2022 and will last until around 07/30/2022. Started application [id number]/62/[id number] for medication assistance program with Wynetta Emery and Wynetta Emery for Xarelto however patient needs to spend 4% of income out of pocket to qualify which would be about $2000. To date patient has only spend about $400. Patient states it would be difficult for him to pay current monthly cost of Xarelto of $150 using his Part D (patient cost is about 25% of total cost).  Provided information about Gwendel Hanson - program that provide Xarelto at $85 per 30 days or $240 per 90 days. Patient will enroll. Provided phone number for [employer].    Reminded patient to get xray ordered by PCP 07/16/2022. Patient plans to get xray in next 1 to 2 days.  CHF -Reminded to take furosemide $RemoveBeforeD'40mg'rjLWTSknmjatcq$  daily and to check weight each morning. Patient will report weight gain of more than 3 lbs in 24 hours or 5 lbs in 1 week. Contact office if you have worsening abdominal fullness or any of these other symptoms. Weight gain, shortness of breath, abdominal fullness, swelling in legs or abdomen, Fatigue and weakness, changes in ability to perform usual activities, persistent cough or wheezing with white or pink blood-tinged mucus, nausea and lack of appetite.   Subjective: Robert Bates is an 81 y.o. year old male who is a primary patient of [name], NP.  The CCM team was consulted for assistance with disease management and care coordination needs.    Engaged with patient by telephone for  follow up  in response to provider referral for pharmacy case management and/or care coordination  services.   Consent to Services:  The patient was given information about Chronic Care Management services, agreed to services, and gave verbal consent prior to initiation of services.  Please see initial visit note for detailed documentation.   Patient Care Team: [name], NP as PCP - General Nahser, Wonda Cheng, MD as PCP - Cardiology (Cardiology) Elsie Stain, MD as Attending Physician (Pulmonary Disease) Virgina Evener, [place] (Optometry) Cherre Robins, RPH-CPP (Pharmacist) Sharmon Revere as Physician Assistant (Cardiology) Spero Geralds, MD as Consulting Physician (Pulmonary Disease)  Recent office visits: 07/16/2022 - Int Med [facility] [place]) Seen for SOB, musculoskelatal pain. Patient lost prescription of Xarelto and cost to refill is $600. No samples available. patient advised to reach out to cardiologist. Plan to bridge with warfarin if cardio has not samples. Checked xray for musculoskeletal pain. Recmmended acetaminophen as needed and [place] lidocaine patches. Follow up with pulmonology.  05/20/2022 - Int Med [facility] [place], NP) Hospital follow up. Ordered chest xray to check pleural effusion. Checked BMET and CBC. However looks like labs and xray were not completed. No med changes noted.  04/19/2022 - Int Med [facility] [place], NP) F/U chronic conditions. Continues to have neck and back pain. He is also concerned with increase in weight. No med changes notes.  03/15/2022 - Int Med [facility] [place], NP) Seen for increased urinary frequency.  Referred to neurology for f/u seizures. Noted to have microscopic hematuria - referred back to urology.  01/15/2022 - Int Med [facility]) Seen for right groin pain, neuropathy and left eye vision changes. Referred  to general surgeon for consultation for inguinal hernia. Started gabapentin 300mg  once daily for neuropathy   Recent consult visits: 07/16/2022 - Cardio - phone call - provided wiht 14 days of Xarelto 20mg  samples.  06/18/2022 -  Oncology (Dr Julien Nordmann) F/U Stage IIB (T3, N0, M0) non-small cell lung cancer, squamous cell carcinoma presented with left lower lobe endobronchial lesion as well as suspicious groundglass opacity in the left upper lobe diagnosed in September 2016. PRIOR THERAPY: Curative radiotherapy to the left upper lobe lung mass under the care of Dr. Sondra Come completed 10/25/2015.   CURRENT THERAPY: Observation. CT scan of chest showed no concerning findings for disease recurrence or metastasis. F/U 1 year. 06/14/2022 - Pulmonology (Dr Shearon Stalls) Seen for follow up pleural effusion and COPD. No evidence of cancer recurrence at this time. Let me know if your breathing is getting worse or you feel like fluid is building back up on your lungs. We can drain it again. There is a very small amount today that is not enough to drain. Next appointment to get a chest xray to follow up on the fluid. F/U 3 months.   04/18/2022 - Pulm (Dr Shearon Stalls) F/U COPD. No med changes noted. 04/12/2022 - Neurology (Dr April Manson) Seen for seizures. Restarted Keppra XR 750mg  daily. F/U 5 months.  04/05/2022 - Cardio Kathlen Mody, [facility]) F/U CHF and afib. Noted volume excess. Increased furosemide to 40mg  daily for 2 days (also take 64mEq for 3 days) (if edema continues could consider SGLT2) Lowered dose of amiodarone to 100mg  daily  01/11/2022 - Pulmonology (Dr Shearon Stalls) Prescribed prednisone 40mg  daily for 5 days.    Hospital visits: 07/13/2022 - ED Visit to [facility]. Seen fro shortness of breath and CP. Checked EKG and chest xray - no acute issues noted. Troponins negative. Suspected muscular pain. Improved with toradol. No medication changes noted. Recommended he follow up with PCP and pulmonology.    Objective:  Lab Results  Component Value Date   CREATININE 0.90 07/13/2022   CREATININE 0.96 06/14/2022   CREATININE 0.86 05/07/2022    Lab Results  Component Value Date   HGBA1C 6.4 04/19/2022   Last diabetic Eye exam:  Lab Results   Component Value Date/Time   HMDIABEYEEXA No Retinopathy 06/16/2021 01:36 PM    Last diabetic Foot exam: No results found for: "HMDIABFOOTEX"      Component Value Date/Time   CHOL 140 07/13/2021 1336   TRIG 72.0 07/13/2021 1336   HDL 67.00 07/13/2021 1336   CHOLHDL 2 07/13/2021 1336   VLDL 14.4 07/13/2021 1336   LDLCALC 58 07/13/2021 1336   LDLCALC 62 04/11/2021 1421   LDLDIRECT 142.2 12/26/2008 1032       Latest Ref Rng & Units 07/13/2022   11:35 AM 06/14/2022    1:24 PM 05/07/2022    4:47 AM  Hepatic Function  Total Protein 6.5 - 8.1 g/dL 7.1  6.6  7.2   Albumin 3.5 - 5.0 g/dL 3.5  4.0  3.8   AST 15 - 41 U/L 14  13  19    ALT 0 - 44 U/L 14  15  16    Alk Phosphatase 38 - 126 U/L 89  93  92   Total Bilirubin 0.3 - 1.2 mg/dL 0.6  0.4  0.6     Lab Results  Component Value Date/Time   TSH 3.620 04/05/2022 11:15 AM   TSH 4.26 08/03/2021 08:25 AM       Latest Ref Rng & Units 07/13/2022   11:35 AM  06/14/2022    1:24 PM 05/07/2022    4:47 AM  CBC  WBC 4.0 - 10.5 K/uL 10.2  7.0  8.0   Hemoglobin 13.0 - 17.0 g/dL 12.3  12.0  12.7   Hematocrit 39.0 - 52.0 % 39.6  38.1  41.3   Platelets 150 - 400 K/uL 248  283  282     No results found for: "VD25OH"  Clinical ASCVD: Yes  The ASCVD Risk score (Arnett DK, et al., 2019) failed to calculate for the following reasons:   The 2019 ASCVD risk score is only valid for ages 47 to 64    Other: CHADS2VASc = 5   Social History   Tobacco Use  Smoking Status Former   Packs/day: 1.00   Years: 57.00   Total pack years: 57.00   Types: Cigarettes, Cigars   Quit date: 08/08/2015   Years since quitting: 6.9  Smokeless Tobacco Former   Types: Chew   Quit date: 11/04/1958  Tobacco Comments   Will smoke cigar every once in awhile.  Vaping daily started a couple weeks ago.  04/18/22 hfb   BP Readings from Last 3 Encounters:  07/16/22 115/62  07/13/22 119/80  06/18/22 125/64   Pulse Readings from Last 3 Encounters:  07/16/22 81  07/13/22  75  06/18/22 (!) 42   Wt Readings from Last 3 Encounters:  07/16/22 183 lb (83 kg)  07/13/22 178 lb 9.2 oz (81 kg)  06/18/22 180 lb 8 oz (81.9 kg)    Assessment: Review of patient past medical history, allergies, medications, health status, including review of consultants reports, laboratory and other test data, was performed as part of comprehensive evaluation and provision of chronic care management services.   SDOH:  (Social Determinants of Health) assessments and interventions performed:  SDOH Interventions    Flowsheet Row Chronic Care Management from 05/13/2022 in [facility] at [place] Management from 11/22/2021 in [facility] at [place] Management from 10/25/2021 in [place] at [place] Management from 09/25/2021 in [place] Management from 08/03/2021 in [place] at [place] Management from 07/26/2021 in [place] at [place] Interventions -- -- -- Intervention Not Indicated -- --  Transportation Interventions Intervention Not Indicated -- -- -- -- --  Financial Strain Interventions -- Other (Comment)  [approved for [place] and me to receive Bretri 11/13/21 thru 11/03/22 but has not shipped yet. Patient will continue Advair until received. Following for Xarelto PAP - has to reache coverage gap each year before we can apply for PAP.] Other (Comment)  [working on [employer] since just started 10/2021] -- -- Other (Comment)  [patient's wife sent application for patient assistance but has not returned applications yet.]  Physical Activity Interventions -- -- -- -- Cardiac Rehab --         CCM Care Plan  Allergies  Allergen Reactions   Iodine Other (See Comments)    neck swells    Iohexol Swelling    Neck and gland swelling per patient.    Medications Reviewed Today     Reviewed by Cherre Robins, RPH-CPP (Pharmacist) on 07/18/22 at 1137  Med List Status: <None>   Medication Order Taking? Sig Documenting Provider Last Dose Status Informant  acetaminophen (TYLENOL)  500 MG tablet [id number] Yes Take 1-2 tablets (500-1,000 mg total) by mouth every 6 (six) hours as needed. Nani Skillern, PA-C Taking Active Multiple Informants, Self, Pharmacy Records  albuterol (PROVENTIL) (2.5 MG/3ML) 0.083% nebulizer solution [id number] Yes Take 3 mLs (2.5 mg total) by nebulization every 6 (six) hours as needed for wheezing or shortness of breath. Spero Geralds, MD Taking Active Multiple Informants, Self, Pharmacy Records           Med Note [facility], [place] B   Thu Jun 06, 2022 11:35 AM)    albuterol (VENTOLIN HFA) 108 (90 Base) MCG/ACT inhaler [id number] Yes Inhale 2 puffs into the lungs every 6 (six) hours as needed for wheezing or shortness of breath. [name], NP Taking Active Multiple Informants, Self, Pharmacy Records           Med Note [place], [place] B   Thu Jun 06, 2022 11:36 AM)    amiodarone (PACERONE) 200 MG tablet [id number] Yes Take 1/2 tablet (100 mg total) by mouth daily. Liliane Shi, PA-C Taking Active Multiple Informants, Self, Pharmacy Records  atorvastatin (LIPITOR) 80 MG tablet [id number] Yes Take 1 tablet (80 mg total) by mouth daily. Nahser, Wonda Cheng, MD Taking Active Multiple Informants, Self, Pharmacy Records  Budeson-Glycopyrrol-Formoterol [facility] AEROSPHERE) 160-9-4.8 MCG/ACT AERO [id number] Yes Inhale 2 puffs into the lungs in the morning and at bedtime. Spero Geralds, MD Taking Active Multiple Informants, Self, Pharmacy Records           Med Note [facility], [place] B   Mon May 13, 2022 11:45 AM)    furosemide (LASIX) 40 MG tablet [id number] Yes Take 1 tablet (40 mg total) by mouth daily. Nita Sells, MD Taking Active   gabapentin (NEURONTIN)  300 MG capsule [id number] Yes Take 1 capsule (300 mg total) by mouth 3 (three) times daily.  Patient taking differently: Take 300 mg by mouth 2 (two) times daily.   [name], NP Taking Active   levETIRAcetam (KEPPRA XR) 750 MG 24 hr tablet [id number] Yes Take 1 tablet (750 mg total) by mouth at bedtime. Alric Ran, MD Taking Active Multiple Informants, Self, Pharmacy Records    Discontinued 05/08/21 1058 Multiple Vitamin (MULTIVITAMIN WITH MINERALS) TABS tablet [id number] Yes Take 1 tablet by mouth daily in the afternoon. [provider] Taking Active Multiple Informants, Self, Pharmacy Records           Med Note [place], [place] B   Mon May 13, 2022 11:57 AM)    omeprazole (PRILOSEC) 40 MG capsule [id number] Yes Take 1 capsule (40 mg total) by mouth daily. [name], NP Taking Active   potassium chloride (KLOR-CON) 10 MEQ tablet [id number] Yes TAKE 2 TABLETS BY MOUTH DAILY FOR 2 DAYS THEN REDUCE TO 1 TABLET BY MOUTH DAILY Liliane Shi, PA-C Taking Active Multiple Informants, Self, Pharmacy Records  rivaroxaban (XARELTO) 20 MG TABS tablet [id number] Yes TAKE 1 TABLET BY MOUTH DAILY WITH SUPPER Nahser, Wonda Cheng, MD Taking Active   tamsulosin (FLOMAX) 0.4 MG CAPS capsule [id number] Yes TAKE 1 CAPSULE (0.4 MG TOTAL) BY MOUTH DAILY. 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[place]) 01/15/2022   Unilateral inguinal hernia without obstruction or gangrene 01/15/2022   COPD exacerbation ([place]) 10/03/2021   Persistent atrial fibrillation (HCC)  05/28/2021   Secondary hypercoagulable state ([place]) 05/28/2021   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   Coronary artery disease 05/08/2021   COVID-19 virus infection 04/23/2021   Erectile dysfunction 04/11/2021   Head trauma 03/07/2021   Left leg pain 04/07/2020   Benign prostatic hyperplasia with nocturia 03/08/2020   Seizures ([place]) 02/18/2020   PAF (paroxysmal atrial fibrillation) ([place]) 08/15/2017   Diabetes type 2, controlled ([place]) 07/31/2017   COPD GOLD II  04/03/2017   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[place]) 09/06/2015   Lung cancer ([place]) 11/07/2014   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   Essential hypertension 04/29/2014   Meningioma ([place]) 10/25/2013   Low back pain 10/25/2013   Osteoarthritis 08/18/2012   Atypical chest pain 08/14/2011   KERATOSIS 10/09/2010   SCIATICA, RIGHT 10/09/2010   UNSPECIFIED HEARING LOSS 05/21/2010   Hyperlipidemia LDL goal <70 05/14/2010   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   RENAL CYST, RIGHT 02/05/2010   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[place]) 01/29/2010   LIPOMA 11/17/2009   MICROSCOPIC HEMATURIA 08/11/2008   GERD 07/26/2008   DERMOID CYST 07/25/2008    Immunization History  Administered Date(s) Administered   Fluad Quad(high Dose 65+) 07/23/2019, 09/04/2020, 07/13/2021   Influenza Split 08/02/2011, 08/18/2012   Influenza Whole 08/17/2008   Influenza, High Dose Seasonal PF 08/09/2015, 09/30/2016, 07/30/2017, 08/07/2018   Influenza,inj,Quad PF,6+ Mos 07/27/2013, 08/09/2014   PFIZER Comirnaty(Gray Top)Covid-19 Tri-Sucrose Vaccine 02/16/2021   PFIZER(Purple Top)SARS-COV-2 Vaccination 01/28/2020, 02/21/2020, 09/04/2020   Pfizer Covid-19 Vaccine Bivalent Booster 58yrs & up 08/03/2021   Pneumococcal Conjugate-13 08/09/2014   Pneumococcal Polysaccharide-23 05/14/2010   Td 07/25/2008    Conditions to be addressed/monitored: Atrial Fibrillation, CHF, HTN, HLD, COPD, and Gout  Care Plan : General Pharmacy (Adult)  Updates made by Cherre Robins,  RPH-CPP since 07/18/2022 12:00 AM     Problem: Management of chronic medicaiton conditions and medication: Atrial Fibrillation, HTN, HLD, type 2 DM, Pulmonary Disease and BPH; seizures; h/o lung cancer and meningioma; GERD; aortic aneurysm Resolved 07/18/2022  Priority: High  Onset Date: 02/08/2021  Note:   Current Barriers:  Does not adhere to prescribed medication regimen Management of chronic medical conditions and medication management Unable to afford medication therapy - improved Education needed regarding chronic conditions and self management  Pharmacist Clinical Goal(s):  Over the next 180 days, patient will achieve adherence to monitoring guidelines and medication adherence to achieve therapeutic efficacy contact provider office for questions/concerns as evidenced notation of same in electronic health record through collaboration with PharmD and provider.  Provide education regarding checking weight and monitoring for signs and symptoms of fluid retention.  Provide education regarding proper inhaler use - maintenance versus rescue.   Interventions: 1:1 collaboration with [name], NP regarding development and update of comprehensive plan of care as evidenced by provider attestation and co-signature Inter-disciplinary care team collaboration (see longitudinal plan of care) Comprehensive medication review performed; medication list updated in electronic medical record  Diabetes: Controlled; A1c goal < 7.0% Lab Results  Component Value Date   HGBA1C 6.4 04/19/2022  Current treatment:  none Current glucose readings: does not check BG at home Current meal patterns: eating a little better since CABG 05/2021. Reports he has difficulty resisting cherry pie Patient reports he has seen optometrist 3 months ago  but he does not feel that he can see as well with his new glasses.  Noted to have neuropathy at last visit. Gabapentin started 300mg  3 times a day. Patient states he is  mostly taking 2 times a day. Denies drowsiness. Reports improved nerve related pain.  Interventions:   Educated on limiting foods containing carbohydrates and sugar; recommended increase non starchy vegetables Counseled on A1c goal.  Patient encouraged to follow up with optometrist regarding vision and new glasses Continue gabapentin 300mg  2 to 3 times a day - warned patient not to drive if drowsy.  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 Also has not been taking potassium supplement 11/29/2021 - he reported that he is taking furosemide some day when he is not working. This past week took Saturday, Sunday and Monday night. Reports that breathing and abdominal fullness has improved. He reports his weight is down about 9 lbs. Per patient (last week 180 lbs and today 171lbs)   Recent hospitaliztion for plural effusion 07/03 to 05/08/2022. Patient states he is taking furosemide daily since he left hospital 05/08/2022 Patient checks BP at home infrequently - no readings provided EF was 60 to 65% 04/2021 Denies hypotensive/hypertensive symptoms Reports occasional LEE (greater in evening)  Has not been checking weight regularly at home.  Interventions:  Educated on  blood pressure goals Continue to take furosemide and potassium  Contact office if you have worsening abdominal fullness or any of these other symptoms. Weight gain, shortness of breath, abdominal fullness, swelling in legs or abdomen, Fatigue and weakness, changes in ability to perform usual activities, persistent cough or  wheezing with white or pink blood-tinged mucus, nausea and lack of appetite Restart checking weight daily - report weight gain of more than 2 to 3 lbs in 24 hours or 5 lbs in 1 week Discussed low sodium diet.    Hyperlipidemia / CAD (LDL < 70) Controlled Lab Results  Component Value Date   CHOL 140 07/13/2021   HDL 67.00 07/13/2021   LDLCALC 58 07/13/2021   LDLDIRECT 142.2 12/26/2008   TRIG 72.0 07/13/2021   CHOLHDL 2 07/13/2021  CABG x 3 vessels 05/08/2021.   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 Recommended try to increase intake of non starchy vegetables Recommended continue current therapy.  Consider recheck lipids with next labs  Chronic Obstructive Pulmonary Disease: Controlled COPD monitored by Dr Shearon Stalls - last visit 01/11/2022 Current treatment:  albuterol inhaler or nebulizer solution use as needed for shortness of breath or wheezing (rescue inhaler) Breztri - inhaler two puffs into lungs twice a day  [place] patient assistance program approved 11/13/2021. Patient reports he has received third shipment of Breztri.  Patient confirms he did receive nebulizer but not sure that he wants to keep it since he wife also has one he can use.  GOLD Classification: 2 (FEV1 50-79%)  Most recent Pulmonary Function Testing: 05/19/2017 - spirometry FVC 2.84 (73%) FEV1 1.74 (62%) FEV1/FVC 61% Current COPD Classification:  B (CATscore >10, <2 exacerbations/yr)  Has not needed albuterol inhaler or nebulizer in the last 2 weeks Interventions:  Education provided about inhaler technique for [employer]. Reminded patient of instructions to use 2 puffs twice a day and to remember to rinse mouth after each use.   Education provided about rescue and maintenance inhaler Take Breztri - inhaler 2 puffs into lungs twice a day. Remember to rinse mouth after each use.  (Maintenance inhaler - should be taken EVERY day)  Albuterol inhaler or nebulizer solution is your rescue medications - it can be used when you are wheezing. Contact office if you are using albuterol more than 2 times per week.    Atrial Fibrillation: NSR since last hospitalization but did recently change from diltiazem to amiodarone due to being in afib prior to cardiac cath Current rate/rhythm controller:  amiodarone 200mg  daily Anticoagulant treatment:  Xarelto 20mg  daily with evening meal CHADS2VASc score: 5  Interventions: (addressed at previous visit) Recommended continue current regimen Patient to have LFTs and TSH monitored by cardiologist regularly due to amiodarone therapy Discussed patient assistance program for xarelto - cannot apply until reaches coverage gap and spends 4% of income out of pocket.  Performed medication cost assessment through [employer]; Patient will likely reach Medicare coverage gap in August 2023.  Take Xarelto once a day with evening meal - improves absorption. Patient signed medication assistance program application today for Xarelto. Forwarded to PCP to review and sign.   GERD:  Patient is currently controlled on the following medications:  Omeprazole 40mg  daily Patient has failed these meds in past: None noted  Triggers: laying down after eating Interventions: (addressed at previous visit)  Recommend continue current medications     Patient Goals/Self-Care Activities Over the next 90 days, patient will:  take medications as prescribed and collaborate with provider on medication access solutions Restart checking weight daily - report weight gain of more than 2 to 3 lbs in 24 hours or 5 lbs in 1 week; Contact office if you have worsening abdominal fullness or any of these other symptoms. Weight  gain, shortness of breath, abdominal fullness, swelling in legs or abdomen, Fatigue and weakness, changes in ability to perform usual activities, persistent cough or wheezing with white or pink blood-tinged mucus, nausea and lack of appetite Take Breztri - inhaler 2 puffs into lungs twice a day. Remember to rinse mouth after each use.  (Maintenance inhaler - should be taken EVERY day)  Albuterol inhaler or nebulizer solution is your rescue medications - it can be used when you are wheezing. Contact office if you are using albuterol more than 2 times per week.  Take Xarelto once a day with evening meal - improves absorption.  Also take last dose of gabapentin at bedtime and take levetiracetam 750mg  at bedtime.   Follow Up Plan: Telephone follow up appointment with care management team member scheduled for:  Clinical Pharmacist Practitioner in 6 to 8 weeks                    Medication Assistance:  Breztri obtained through [facility] and Me medication assistance program.  Enrollment ends 11/03/2022  .   Has not met out of pocket spend for 2023 to qualify for Xarelto medication assistance program.  Patient to use [employer] program for lower cost Xarelto.   Patient's preferred pharmacy is:  [facility] [address] [place] [address] Phone: [phone] Fax: [phone]   Follow Up:  Patient agrees to Care Plan and Follow-up.  Plan: Telephone follow up appointment with care management team member scheduled for:  2 to 3 months .   Cherre Robins, PharmD Clinical Pharmacist [facility]

## 2022-07-18 NOTE — Patient Instructions (Addendum)
Mr Loll It was a pleasure speaking with you today.  Below is a summary of your health goals and summary of our recent visit. You can also view your updated Chronic Care Management Care plan through your MyChart account.   Patient Goals/Self-Care Activities take medications as prescribed and collaborate with provider on medication access solutions Check weight daily - report weight gain of more than 2 to 3 lbs in 24 hours or 5 lbs in 1 week; Contact office if you have worsening abdominal fullness or any of these other symptoms. Weight gain, shortness of breath, abdominal fullness, swelling in legs or abdomen, Fatigue and weakness, changes in ability to perform usual activities, persistent cough or wheezing with white or pink blood-tinged mucus, nausea and lack of appetite Take Breztri - inhaler 2 puffs into lungs twice a day. Remember to rinse mouth after each use.  (Maintenance inhaler - should be taken EVERY day)  Albuterol inhaler or nebulizer solution is your rescue medications - it can be used when you are wheezing. Contact office if you are using albuterol more than 2 times per week.  Take Xarelto once a day with evening meal - improves absorption.  Also take last dose of gabapentin at bedtime and take levetiracetam 750mg  at bedtime.   As always if you have any questions or concerns especially regarding medications, please feel free to contact me either at the phone number below or with a MyChart message.   Keep up the good work!  Cherre Robins, PharmD Clinical Pharmacist [place] [phone] (direct line)  [phone] (main office number)   Patient verbalizes understanding of instructions and care plan provided today and agrees to view in [place]. Active MyChart status and patient understanding of how to access instructions and care plan via MyChart confirmed with patient.

## 2022-07-18 NOTE — Telephone Encounter (Signed)
See phone visit from today

## 2022-07-19 ENCOUNTER — Other Ambulatory Visit (HOSPITAL_BASED_OUTPATIENT_CLINIC_OR_DEPARTMENT_OTHER): Payer: Self-pay

## 2022-07-19 ENCOUNTER — Ambulatory Visit: Payer: Medicare Other | Admitting: Family

## 2022-07-19 ENCOUNTER — Ambulatory Visit (HOSPITAL_BASED_OUTPATIENT_CLINIC_OR_DEPARTMENT_OTHER)
Admission: RE | Admit: 2022-07-19 | Discharge: 2022-07-19 | Disposition: A | Discharge status: Home / Self Care | Payer: Medicare Other | Source: Ambulatory Visit | Attending: Family | Admitting: Family

## 2022-07-19 DIAGNOSIS — J90 Pleural effusion, not elsewhere classified: Secondary | ICD-10-CM | POA: Diagnosis not present

## 2022-07-19 DIAGNOSIS — R918 Other nonspecific abnormal finding of lung field: Secondary | ICD-10-CM | POA: Diagnosis not present

## 2022-07-19 DIAGNOSIS — M7918 Myalgia, other site: Secondary | ICD-10-CM | POA: Insufficient documentation

## 2022-07-25 ENCOUNTER — Other Ambulatory Visit (HOSPITAL_BASED_OUTPATIENT_CLINIC_OR_DEPARTMENT_OTHER): Payer: Self-pay

## 2022-07-26 ENCOUNTER — Telehealth: Payer: Self-pay | Admitting: Pharmacist

## 2022-07-26 DIAGNOSIS — I480 Paroxysmal atrial fibrillation: Secondary | ICD-10-CM

## 2022-07-26 MED ORDER — RIVAROXABAN 20 MG PO TABS: ORAL_TABLET | ORAL | 0 refills | Status: DC

## 2022-07-26 NOTE — Telephone Encounter (Signed)
Patient called requesting samples for Xarelto 20mg . He is in coverage gap and does not qualify for medication assistance program due to requirement to spend 4% of income out of pocket for Xarelto's medication assistance program. He is planning to purchase Xarelto from [employer] ($85 per 30 days) but has not been able to set up profile yet. He is planning to do it today.  Provided #14 samples - lot# [id number] / exp 06/2024

## 2022-08-03 DIAGNOSIS — E785 Hyperlipidemia, unspecified: Secondary | ICD-10-CM | POA: Diagnosis not present

## 2022-08-03 DIAGNOSIS — E1159 Type 2 diabetes mellitus with other circulatory complications: Secondary | ICD-10-CM

## 2022-08-03 DIAGNOSIS — J449 Chronic obstructive pulmonary disease, unspecified: Secondary | ICD-10-CM | POA: Diagnosis not present

## 2022-08-03 DIAGNOSIS — I5030 Unspecified diastolic (congestive) heart failure: Secondary | ICD-10-CM

## 2022-08-03 DIAGNOSIS — I4891 Unspecified atrial fibrillation: Secondary | ICD-10-CM | POA: Diagnosis not present

## 2022-08-12 ENCOUNTER — Other Ambulatory Visit (HOSPITAL_BASED_OUTPATIENT_CLINIC_OR_DEPARTMENT_OTHER): Payer: Self-pay

## 2022-08-13 ENCOUNTER — Encounter: Payer: Self-pay | Admitting: Physician Assistant

## 2022-08-13 ENCOUNTER — Telehealth: Payer: Self-pay | Admitting: Cardiovascular Disease

## 2022-08-13 ENCOUNTER — Other Ambulatory Visit (HOSPITAL_BASED_OUTPATIENT_CLINIC_OR_DEPARTMENT_OTHER): Payer: Self-pay

## 2022-08-13 ENCOUNTER — Telehealth: Payer: Self-pay | Admitting: Family

## 2022-08-13 DIAGNOSIS — I480 Paroxysmal atrial fibrillation: Secondary | ICD-10-CM

## 2022-08-13 MED ORDER — RIVAROXABAN 20 MG PO TABS: 20.0000 mg | ORAL_TABLET | Freq: Every day | ORAL | 0 refills | Status: DC

## 2022-08-13 NOTE — Telephone Encounter (Signed)
Spoke with patient. He has enrolled in [employer] (can get Xarelto at lower dose thru speciality pharmacy) but has not received first delivery yet. Called Engineer, maintenance and per Merchant navy officer they need Rx for Xarelto 20mg  daily. Per tech they have requested Rx twice from cardio office but have not received Rx.  Sent Rx for 90 days to [facility] that is pharmacy used by [employer].  Patient is out of Xarelto and they will overnight Rx to him. He will receive either late tomorrow or Thursday 10/12.  Our office does not currently have xarelto samples. Patient to ask cardio office or consider purchasing a few days from local pharmacy until this [place] select Rx arrives.

## 2022-08-13 NOTE — Telephone Encounter (Signed)
Patient calling back.   °

## 2022-08-13 NOTE — Telephone Encounter (Signed)
Patient calling the office for samples of medication:   1.  What medication and dosage are you requesting samples for? rivaroxaban (XARELTO) 20 MG TABS tablet  2.  Are you currently out of this medication? Yes

## 2022-08-13 NOTE — Telephone Encounter (Signed)
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 He states that he does not have any paperwork that needs to be filled out or any other information sent. He states that letter needs to be faxed to [phone]. He is requesting that this be sent today.

## 2022-08-13 NOTE — Telephone Encounter (Signed)
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 He states the deadline to have it completed is today and he needs it ASAP.

## 2022-08-13 NOTE — Telephone Encounter (Signed)
I just wrote letter. It is in his chart and I sent it to him via [place]. Richardson Dopp, PA-C    08/13/2022 5:23 PM

## 2022-08-13 NOTE — Telephone Encounter (Signed)
Hey lynn, LPN, can you please advise on this matter? Thanks

## 2022-08-13 NOTE — Telephone Encounter (Signed)
Patient wanted to speak to pharmacist about his blood thinners and see if we have any samples. Please call to discuss.

## 2022-08-14 NOTE — Telephone Encounter (Signed)
I had 1 bottle of Xarelto 10mg  of samples. Provided patient with #7 / 1 bottle of Xarelto 10mg  - he is aware he will take 2 tablets to equal 20mg  daily.

## 2022-08-14 NOTE — Telephone Encounter (Signed)
Spoke with the patient and made him aware of letter. Patient verbalized understanding.

## 2022-08-14 NOTE — Telephone Encounter (Signed)
**Note De-Identified Joakim Huesman Obfuscation** Per the pts chart notes since at least 8/9 (maybe longer) this has been on going and the pt has been given Xarelto samples multiple times.  I asked the pt if he has reached out to [employer] as directed multiple times by Verdell Carmine, [facility] with his PCPs office and he stated "thank you" and hung up.  He called the office right back and apologized as he stated that  he did not mean to hang up on me. He advised me that he was advised by Cherre Robins, [facility] with his PAPs office to contact us for more samples as his should be arriving from [employer] on Thursday this week.  I advised him that since we have already provided him Xarelto samples when he stated that he lost a bottle of Xarelto on 9/12 and there are notes that his PCP has been giving him samples as well. I do see that a Xarelto RX was e-scribed to [facility] as follows:  Dispense: 90 tablet  Refills: 0 ordered  Pharmacy: [id number] - [place] [address] (Ph: [phone])  Order Details Ordered on: 08/13/22  Associated Dx: PAF (paroxysmal atrial fibrillation) ([place])  Ordering provider: Cherre Robins, RPH-CPP  Authorizing provider: Mosie Lukes, MD   I have asked the pt to contact Tammy at his PCPs office to request more Xarelto samples as our supply is limited and meant for pt who are just starting Xarelto. He verbalized understanding and thanked me for calling him back and for accepting his call when he called me back.

## 2022-08-15 ENCOUNTER — Other Ambulatory Visit (HOSPITAL_BASED_OUTPATIENT_CLINIC_OR_DEPARTMENT_OTHER): Payer: Self-pay

## 2022-08-16 ENCOUNTER — Ambulatory Visit (INDEPENDENT_AMBULATORY_CARE_PROVIDER_SITE_OTHER): Payer: Medicare Other | Admitting: Family

## 2022-08-16 VITALS — BP 116/70 | HR 60 | Temp 97.5°F | Resp 16 | Wt 185.0 lb

## 2022-08-16 DIAGNOSIS — Z23 Encounter for immunization: Secondary | ICD-10-CM | POA: Diagnosis not present

## 2022-08-16 DIAGNOSIS — M7918 Myalgia, other site: Secondary | ICD-10-CM

## 2022-08-16 DIAGNOSIS — I10 Essential (primary) hypertension: Secondary | ICD-10-CM | POA: Diagnosis not present

## 2022-08-16 DIAGNOSIS — H9313 Tinnitus, bilateral: Secondary | ICD-10-CM

## 2022-08-16 NOTE — Assessment & Plan Note (Signed)
Likely related to hearing loss. Discussed use of white noise.

## 2022-08-16 NOTE — Assessment & Plan Note (Signed)
Resolved. Monitor.

## 2022-08-16 NOTE — Assessment & Plan Note (Addendum)
BP Readings from Last 3 Encounters:  08/16/22 116/70  07/16/22 115/62  07/13/22 119/80   BP at goal. Not currently on antihypertensive.

## 2022-08-16 NOTE — Progress Notes (Signed)
Subjective:     Patient ID: Robert Bates, male    DOB: [birth year], 81 y.o.   MRN: [id number]  Chief Complaint  Patient presents with   Muscle Pain    Feeling much better      Patient is in today for follow up of his left sided rib pain. We last saw him for this complaint on 07/16/22. He complained of left sided rib pain.  No rib fracture on x-ray.Purchased an OTC salon pas patches. Today he reports resolution of pain. Left rib pain is improved.   Tinnitus- has left hearing aid on left and plan to get a right sided hearing aid.   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   COPD (chronic obstructive pulmonary disease) (HCC)    Coronary artery disease    Diabetes type 2, controlled ([place]) 07/31/2017   Dyspnea    Dysrhythmia    a fib   GERD (gastroesophageal reflux disease)    Hematuria    refuses work up or referral - understands risks of morbidity / mortality - 11/2008, 12/2008   History of hiatal hernia    History of kidney stones    Hyperlipemia    Meningioma ([place]) 10/25/2013   Follows with Dr. [name].    Peripheral vascular disease ([place])    Abdominal Aortic Aneursym   Pneumonia    as a child   Radiation 09/18/15-10/25/15   left lower lobe 70.2 Gy   Seizures ([place]) 02/18/2020   Tobacco abuse     Past Surgical History:  Procedure Laterality Date   CHOLECYSTECTOMY N/A 07/23/2017   Procedure: LAPAROSCOPIC CHOLECYSTECTOMY;  Surgeon: Mickeal Skinner, MD;  Location: [facility];   Service: General;  Laterality: N/A;   CLIPPING OF ATRIAL APPENDAGE Left 05/08/2021   Procedure: CLIPPING OF ATRIAL APPENDAGE USING 37 ATRICLIP;  Surgeon: Wonda Olds, MD;  Location: [facility];  Service: Open Heart Surgery;  Laterality: Left;   COLONOSCOPY     CORONARY ARTERY BYPASS GRAFT N/A 05/08/2021   Procedure: CORONARY ARTERY BYPASS GRAFTING (CABG)X 3 USING LEFT INTERNAL MAMMARY ARTERY AND RIGHT GREATER SAPEHNOUS VEIN;  Surgeon: Wonda Olds, MD;  Location: [facility];  Service: Open Heart Surgery;  Laterality: N/A;   ENDOVEIN HARVEST OF GREATER SAPHENOUS VEIN Right 05/08/2021   Procedure: ENDOVEIN HARVEST OF GREATER SAPHENOUS VEIN;  Surgeon: Wonda Olds, MD;  Location: [facility];  Service: Open Heart Surgery;  Laterality: Right;   EYE SURGERY Bilateral    Cataracts removed w/ lens implant   HERNIA REPAIR     Left 36 years ago . Right inguinal hernia repair 10-01-17 Dr. Kieth Brightly   INGUINAL HERNIA REPAIR Right 10/01/2017   Procedure: RIGHT INGUINAL HERNIA REPAIR WITH MESH;  Surgeon: Kinsinger, Arta Bruce, MD;  Location: [facility];  Service: General;  Laterality: Right;  TAP BLOCK   INSERTION OF MESH Right 10/01/2017   Procedure: INSERTION OF MESH;  Surgeon: [name]  Marjory Lies, MD;  Location: [facility];  Service: General;  Laterality: Right;   IR THORACENTESIS ASP PLEURAL SPACE W/IMG GUIDE  05/18/2021   IR THORACENTESIS ASP PLEURAL SPACE W/IMG GUIDE  06/07/2021   LEFT HEART CATH AND CORONARY ANGIOGRAPHY N/A 04/20/2021   Procedure: LEFT HEART CATH AND CORONARY ANGIOGRAPHY;  Surgeon: Wellington Hampshire, MD;  Location: [facility];  Service: Cardiovascular;  Laterality: N/A;   TEE WITHOUT CARDIOVERSION N/A 05/08/2021   Procedure: TRANSESOPHAGEAL ECHOCARDIOGRAM (TEE);  Surgeon: Wonda Olds, MD;  Location: [place];  Service: Open Heart Surgery;  Laterality: N/A;   TONSILLECTOMY     TONSILLECTOMY     VIDEO BRONCHOSCOPY Bilateral 07/26/2015   Procedure: VIDEO BRONCHOSCOPY WITH FLUORO;  Surgeon:  [name], MD;  [facility];  Service: Cardiopulmonary;  Laterality: Bilateral;   VIDEO BRONCHOSCOPY WITH ENDOBRONCHIAL NAVIGATION N/A 08/23/2015   Procedure: VIDEO BRONCHOSCOPY WITH ENDOBRONCHIAL NAVIGATION;  Surgeon: Grace Isaac, MD;  Location[facility];  Service: Thoracic;  Laterality: N/A;   VIDEO BRONCHOSCOPY WITH ENDOBRONCHIAL ULTRASOUND N/A 08/23/2015   Procedure: VIDEO BRONCHOSCOPY WITH ENDOBRONCHIAL ULTRASOUND;  Surgeon: Grace Isaac, MD;  Location: [facility];  Service: Thoracic;  Laterality: N/A;    Family History  Problem Relation Age of Onset   Leukemia Father    Emphysema Father    Learning disabilities Son    Atrial fibrillation Son    Leukemia Other    Stroke Other     Social History   Socioeconomic History   Marital status: Married    Spouse name: Not on file   Number of children: 2   Years of education: Not on file   Highest education level: Not on file  Occupational History   Occupation: Retired    Fish farm manager: [employer]    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[employer]  Tobacco Use   Smoking status: Former    Packs/day: 1.00    Years: 57.00    Total pack years: 57.00    Types: Cigarettes, Cigars    Quit date: 08/08/2015    Years since quitting: 7.0   Smokeless tobacco: Former    Types: Chew    Quit date: 11/04/1958   Tobacco comments:    Will smoke cigar every once in awhile.  Vaping daily started a couple weeks ago.  04/18/22 hfb  Vaping Use   Vaping Use: Former  Substance and Sexual Activity   Alcohol use: Not Currently    Alcohol/week: 0.0 standard drinks of alcohol   Drug use: No   Sexual activity: Not Currently  Other Topics Concern   Not on file  Social History Narrative   Not on file   Social Determinants of Health   Financial Resource Strain: Medium Risk (11/22/2021)   Overall Financial Resource Strain (CARDIA)    Difficulty of Paying Living Expenses: Somewhat hard  Food Insecurity: No Food Insecurity (09/25/2021)    Hunger Vital Sign    Worried About Running Out of Food in the Last Year: Never true    Ran Out of Food in the Last Year: Never true  Transportation Needs: No Transportation Needs (05/13/2022)   PRAPARE - Hydrologist (Medical): No    Lack of Transportation (Non-Medical): No  Physical Activity: Insufficiently Active (08/05/2021)   Exercise Vital Sign    Days of Exercise per Week: 2 days    Minutes of Exercise per Session: 40 min  Stress: No Stress Concern Present (10/01/2021)  [employer] - Occupational Stress Questionnaire    Feeling of Stress : Not at all  Social Connections: Moderately Isolated (05/13/2022)   Social Connection and Isolation Panel [NHANES]    Frequency of Communication with Friends and Family: Twice a week    Frequency of Social Gatherings with Friends and Family: Once a week    Attends Religious Services: Never    Marine scientist or Organizations: No    Attends Archivist Meetings: Never    Marital Status: Married  Human resources officer Violence: Not At Risk (10/01/2021)   Humiliation, Afraid, Rape, and Kick questionnaire    Fear of Current or Ex-Partner: No    Emotionally Abused: No    Physically Abused: No    Sexually Abused: No    Outpatient Medications Prior to Visit  Medication Sig Dispense Refill   acetaminophen (TYLENOL) 500 MG tablet Take 1-2 tablets (500-1,000 mg total) by mouth every 6 (six) hours as needed. 30 tablet 0   albuterol (PROVENTIL) (2.5 MG/3ML) 0.083% nebulizer solution Take 3 mLs (2.5 mg total) by nebulization every 6 (six) hours as needed for wheezing or shortness of breath. 75 mL 12   albuterol (VENTOLIN HFA) 108 (90 Base) MCG/ACT inhaler Inhale 2 puffs into the lungs every 6 (six) hours as needed for wheezing or shortness of breath. 8.5 g 5   amiodarone (PACERONE) 200 MG tablet Take 1/2 tablet (100 mg total) by mouth daily. 45 tablet 3   atorvastatin (LIPITOR) 80 MG  tablet Take 1 tablet (80 mg total) by mouth daily. 90 tablet 3   Budeson-Glycopyrrol-Formoterol (BREZTRI AEROSPHERE) 160-9-4.8 MCG/ACT AERO Inhale 2 puffs into the lungs in the morning and at bedtime. 10.7 g 5   furosemide (LASIX) 40 MG tablet Take 1 tablet (40 mg total) by mouth daily. 30 tablet 1   gabapentin (NEURONTIN) 300 MG capsule Take 1 capsule (300 mg total) by mouth 3 (three) times daily. (Patient taking differently: Take 300 mg by mouth 2 (two) times daily.) 90 capsule 1   levETIRAcetam (KEPPRA XR) 750 MG 24 hr tablet Take 1 tablet (750 mg total) by mouth at bedtime. 30 tablet 11   Multiple Vitamin (MULTIVITAMIN WITH MINERALS) TABS tablet Take 1 tablet by mouth daily in the afternoon.     omeprazole (PRILOSEC) 40 MG capsule Take 1 capsule (40 mg total) by mouth daily. 90 capsule 1   potassium chloride (KLOR-CON) 10 MEQ tablet TAKE 2 TABLETS BY MOUTH DAILY FOR 2 DAYS THEN REDUCE TO 1 TABLET BY MOUTH DAILY 92 tablet 3   rivaroxaban (XARELTO) 20 MG TABS tablet Take 1 tablet (20 mg total) by mouth daily with supper. Patient is out of Xarelto. Please overnight to him. 90 tablet 0   tamsulosin (FLOMAX) 0.4 MG CAPS capsule TAKE 1 CAPSULE (0.4 MG TOTAL) BY MOUTH DAILY. 90 capsule 1   No facility-administered medications prior to visit.    Allergies  Allergen Reactions   Iodine Other (See Comments)    neck swells   Iohexol Swelling    Neck and gland swelling per patient.    ROS See HPI    Objective:    Physical Exam Constitutional:      General: He is not in acute distress.    Appearance: He is well-developed.  HENT:     Head: Normocephalic and atraumatic.  Cardiovascular:     Rate and Rhythm: Normal rate and regular rhythm.     Heart sounds: No murmur heard. Pulmonary:  Effort: Pulmonary effort is normal. No respiratory distress.     Breath sounds: Wheezing and rhonchi present. No rales.  Skin:    General: Skin is warm and dry.  Neurological:     Mental Status: He is  alert and oriented to person, place, and time.  Psychiatric:        Behavior: Behavior normal.        Thought Content: Thought content normal.     BP 116/70 (BP Location: Right Arm, Patient Position: Sitting, Cuff Size: Small)   Pulse 60   Temp (!) 97.5 F (36.4 C) (Oral)   Resp 16   Wt 185 lb (83.9 kg)   SpO2 96%   BMI 28.98 kg/m  Wt Readings from Last 3 Encounters:  08/16/22 185 lb (83.9 kg)  07/16/22 183 lb (83 kg)  07/13/22 178 lb 9.2 oz (81 kg)       Assessment & Plan:   Problem List Items Addressed This Visit       Unprioritized   Essential hypertension (Chronic)    BP Readings from Last 3 Encounters:  08/16/22 116/70  07/16/22 115/62  07/13/22 119/80  BP at goal. Not currently on antihypertensive.       Tinnitus of both ears - Primary    Likely related to hearing loss. Discussed use of white noise.       Musculoskeletal pain    Resolved. Monitor.       Other Visit Diagnoses     Needs flu shot       Relevant Orders   Flu Vaccine QUAD High Dose(Fluad) (Completed)       I am having Fransisca Connors maintain his multivitamin with minerals, acetaminophen, albuterol, Breztri Aerosphere, albuterol, atorvastatin, potassium chloride, amiodarone, levETIRAcetam, furosemide, omeprazole, tamsulosin, gabapentin, and rivaroxaban.  No orders of the defined types were placed in this encounter.

## 2022-08-19 ENCOUNTER — Telehealth: Payer: Self-pay | Admitting: Pharmacist

## 2022-08-19 NOTE — Telephone Encounter (Signed)
Patient called states that he has not received delivery yet and he has not been charged for Xarelto yet. When he was in the office last week I had spoke with Gwendel Hanson / Xarelto With Me program. They needed updated payment information. It was provided and they stated patient should have received medication by Saturday 08/17/2022. Called Xarelto With Me Program. They state that Xarelto was delayed - will be filled today and overnighted. Patient should receive at his sister in laws address tomorrow around 11am.  Left patient another sample of Xarelto 10mg  - he is reminded to take 2 tablets = 20mg  daily. This should get him thru until received delivery tomorrow.

## 2022-08-23 ENCOUNTER — Other Ambulatory Visit (HOSPITAL_BASED_OUTPATIENT_CLINIC_OR_DEPARTMENT_OTHER): Payer: Self-pay

## 2022-08-23 ENCOUNTER — Ambulatory Visit: Payer: Medicare Other | Admitting: Family

## 2022-08-23 DIAGNOSIS — H905 Unspecified sensorineural hearing loss: Secondary | ICD-10-CM | POA: Diagnosis not present

## 2022-09-02 ENCOUNTER — Other Ambulatory Visit (HOSPITAL_BASED_OUTPATIENT_CLINIC_OR_DEPARTMENT_OTHER): Payer: Self-pay

## 2022-09-02 ENCOUNTER — Other Ambulatory Visit: Payer: Self-pay | Admitting: Family

## 2022-09-02 MED ORDER — TAMSULOSIN HCL 0.4 MG PO CAPS
0.4000 mg | ORAL_CAPSULE | Freq: Every day | ORAL | 1 refills | Status: DC
  Filled 2022-09-02 – 2022-11-02 (×4): qty 90, 90d supply, fill #0
  Filled 2023-01-23: qty 90, 90d supply, fill #1

## 2022-09-04 ENCOUNTER — Other Ambulatory Visit: Payer: Self-pay | Admitting: Family

## 2022-09-04 ENCOUNTER — Telehealth: Payer: Self-pay | Admitting: Family

## 2022-09-04 ENCOUNTER — Other Ambulatory Visit (HOSPITAL_BASED_OUTPATIENT_CLINIC_OR_DEPARTMENT_OTHER): Payer: Self-pay

## 2022-09-04 ENCOUNTER — Ambulatory Visit (INDEPENDENT_AMBULATORY_CARE_PROVIDER_SITE_OTHER): Payer: Medicare Other | Admitting: Medical

## 2022-09-04 VITALS — BP 120/62 | HR 83 | Temp 98.5°F | Resp 18 | Ht 67.0 in | Wt 187.0 lb

## 2022-09-04 DIAGNOSIS — R059 Cough, unspecified: Secondary | ICD-10-CM | POA: Diagnosis not present

## 2022-09-04 DIAGNOSIS — M791 Myalgia, unspecified site: Secondary | ICD-10-CM

## 2022-09-04 DIAGNOSIS — E1142 Type 2 diabetes mellitus with diabetic polyneuropathy: Secondary | ICD-10-CM

## 2022-09-04 DIAGNOSIS — J0100 Acute maxillary sinusitis, unspecified: Secondary | ICD-10-CM | POA: Diagnosis not present

## 2022-09-04 LAB — POC COVID19 BINAXNOW: SARS Coronavirus 2 Ag: POSITIVE — AB

## 2022-09-04 LAB — POCT INFLUENZA A/B
Influenza A, POC: NEGATIVE
Influenza B, POC: NEGATIVE

## 2022-09-04 MED ORDER — GABAPENTIN 300 MG PO CAPS
300.0000 mg | ORAL_CAPSULE | Freq: Three times a day (TID) | ORAL | 1 refills | Status: DC
  Filled 2022-09-04: qty 90, 30d supply, fill #0
  Filled 2022-10-04 – 2022-11-02 (×2): qty 90, 30d supply, fill #1

## 2022-09-04 MED ORDER — AMOXICILLIN-POT CLAVULANATE 875-125 MG PO TABS
1.0000 | ORAL_TABLET | Freq: Two times a day (BID) | ORAL | 0 refills | Status: DC
  Filled 2022-09-04: qty 20, 10d supply, fill #0

## 2022-09-04 MED ORDER — MOLNUPIRAVIR EUA 200MG CAPSULE
4.0000 | ORAL_CAPSULE | Freq: Two times a day (BID) | ORAL | 0 refills | Status: AC
  Filled 2022-09-04: qty 40, 5d supply, fill #0

## 2022-09-04 MED ORDER — FLUTICASONE PROPIONATE 50 MCG/ACT NA SUSP
2.0000 | Freq: Every day | NASAL | 1 refills | Status: DC
  Filled 2022-09-04: qty 16, 30d supply, fill #0
  Filled 2022-10-04: qty 16, 30d supply, fill #1

## 2022-09-04 MED ORDER — BENZONATATE 100 MG PO CAPS
100.0000 mg | ORAL_CAPSULE | Freq: Three times a day (TID) | ORAL | 0 refills | Status: DC | PRN
  Filled 2022-09-04: qty 30, 10d supply, fill #0

## 2022-09-04 NOTE — Telephone Encounter (Signed)
Pt called to follow up on medication status. Advised pt that Melissa had left for the day and that Rod Holler was looking into this matter for him. Pt asked if there was anything that could be done to speed up this process. Advised pt that another note would be sent back to look into it but it might take a bit as a different provider may have to look into this. Pt acknowledged understanding and stated he would be swinging by the pharmacy on his way home today to pick it up.

## 2022-09-04 NOTE — Addendum Note (Signed)
Addended by: Anabel Halon on: 09/04/2022 04:34 PM   Modules accepted: Orders

## 2022-09-04 NOTE — Telephone Encounter (Signed)
Pt states he has a sinus infection. He did not want to wait until pcp's next availability or see another provider and is hoping something can be called in to help. He has a runny nose, congestion, and stated he jaw is a little swollen. Please advise.    [facility] [address] [place], [place] [address] Phone: [phone]  Fax: [phone]

## 2022-09-04 NOTE — Patient Instructions (Addendum)
Your appear to have  sinus infection. I am prescribing  augmentin antibiotic for the infection. To help with the nasal congestion I prescribed  flonase nasal steroid.   For your associated cough, I prescribed benzonatate.  For wheezing continue your inhalers.  Your flu test was negative. Your covid test came back positive. Will rx molnupiravir. Rx advisement given. You should improve. Can add on vit D 2000 iu daily. Recommend stay off work for 5 days  Rest, hydrate, tylenol for fever.  Follow up in 7 days or as needed.

## 2022-09-04 NOTE — Telephone Encounter (Signed)
Patient scheduled to come in this afternoon to see Johnnette Gourd.

## 2022-09-04 NOTE — Progress Notes (Addendum)
Subjective:    Patient ID: Robert Bates, male    DOB: Oct 01, 1941, 81 y.o.   MRN: [id number]  HPI  Pt in recent illness.   Pt has some recent bilateral maxillary sinus pain and runny nose for about 10 days. Pt states he never had sinus infection but he thinks this might be first one. Face feels hot.   Pt stats he feels nasal congested. He stats left cheek area feels worse.  No fever, no chills or sweats.  Pt states started with body aches today.  No recent covid vaccine. Flu vaccine on Aug 16, 2020.     Review of Systems  Constitutional:  Negative for chills, fatigue and fever.  HENT:  Positive for congestion, sinus pressure and sinus pain. Negative for drooling.   Respiratory:  Negative for cough, chest tightness, shortness of breath and wheezing.   Cardiovascular:  Negative for chest pain and palpitations.  Gastrointestinal:  Negative for abdominal pain, blood in stool and nausea.  Genitourinary:  Negative for dysuria and flank pain.  Musculoskeletal:  Positive for myalgias. Negative for back pain, gait problem and joint swelling.  Skin:  Negative for rash.    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   COPD (chronic obstructive pulmonary disease) (HCC)    Coronary artery disease    Diabetes type 2, controlled ([place]) 07/31/2017   Dyspnea    Dysrhythmia    a fib   GERD (gastroesophageal reflux disease)    Hematuria    refuses work up or referral - understands risks of morbidity / mortality - 11/2008, 12/2008   History of hiatal hernia    History of kidney stones    Hyperlipemia    Meningioma ([place]) 10/25/2013   Follows with Dr. [name].    Peripheral vascular disease ([place])    Abdominal Aortic Aneursym    Pneumonia    as a child   Radiation 09/18/15-10/25/15   left lower lobe 70.2 Gy   Seizures ([place]) 02/18/2020   Tobacco abuse      Social History   Socioeconomic History   Marital status: Married    Spouse name: Not on file   Number of children: 2   Years of education: Not on file   Highest education level: Not on file  Occupational History   Occupation: Retired    Fish farm manager: [employer]    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[place] Use   Smoking status: Former    Packs/day: 1.00    Years: 57.00    Total pack years: 57.00    Types: Cigarettes, Cigars    Quit date: 08/08/2015    Years since quitting: 7.0   Smokeless tobacco: Former    Types: Chew    Quit date: 11/04/1958   Tobacco comments:    Will smoke cigar every once in awhile.  Vaping daily started a couple weeks ago.  04/18/22 hfb  Vaping Use   Vaping Use: Former  Substance and Sexual Activity   Alcohol use: Not Currently    Alcohol/week: 0.0 standard drinks of alcohol   Drug use: No   Sexual activity: Not Currently  Other Topics Concern   Not on file  Social History Narrative   Not on file   Social Determinants of Health   Financial Resource Strain: Medium Risk (11/22/2021)   Overall Financial Resource Strain (CARDIA)    Difficulty of Paying Living Expenses: Somewhat hard  Food Insecurity: No Food Insecurity (09/25/2021)   Hunger Vital Sign    Worried About Running Out of Food in the Last Year: Never true    Ran Out of Food in the Last Year: Never true  Transportation Needs: No Transportation Needs (05/13/2022)   PRAPARE - Hydrologist (Medical): No    Lack of Transportation (Non-Medical): No  Physical Activity: Insufficiently Active (08/05/2021)   Exercise Vital Sign    Days of Exercise per Week: 2 days    Minutes of Exercise per Session: 40 min  Stress: No Stress Concern Present (10/01/2021)   [place]    Feeling of Stress : Not at all  Social Connections: Moderately Isolated (05/13/2022)   Social Connection and Isolation Panel [NHANES]    Frequency of Communication with Friends and Family: Twice a week    Frequency of Social Gatherings with Friends and Family: Once a week    Attends Religious Services: Never    Marine scientist or Organizations: No    Attends Archivist Meetings: Never    Marital Status: Married  Human resources officer Violence: Not At Risk (10/01/2021)   Humiliation, Afraid, Rape, and Kick questionnaire    Fear of Current or Ex-Partner: No    Emotionally Abused: No    Physically Abused: No    Sexually Abused: No    Past Surgical History:  Procedure Laterality Date   CHOLECYSTECTOMY N/A 07/23/2017   Procedure: LAPAROSCOPIC CHOLECYSTECTOMY;  Surgeon: Kinsinger, Arta Bruce, MD;  Location: [facility];  Service: General;  Laterality: N/A;   CLIPPING OF ATRIAL APPENDAGE Left 05/08/2021   Procedure: CLIPPING OF ATRIAL APPENDAGE USING 54 ATRICLIP;  Surgeon: Wonda Olds, MD;  Location: [facility];  Service: Open Heart Surgery;  Laterality: Left;   COLONOSCOPY     CORONARY ARTERY BYPASS GRAFT N/A 05/08/2021   Procedure: CORONARY ARTERY BYPASS GRAFTING (CABG)X 3 USING LEFT INTERNAL MAMMARY ARTERY AND RIGHT GREATER SAPEHNOUS VEIN;  Surgeon: Wonda Olds, MD;  Location: [facility];  Service: Open Heart Surgery;  Laterality: N/A;   ENDOVEIN HARVEST OF GREATER SAPHENOUS VEIN Right 05/08/2021   Procedure: ENDOVEIN HARVEST OF GREATER SAPHENOUS VEIN;  Surgeon: Wonda Olds, MD;  Location: [facility];  Service: Open Heart Surgery;  Laterality: Right;   EYE SURGERY Bilateral    Cataracts removed w/ lens implant   HERNIA REPAIR     Left 36 years ago . Right inguinal hernia repair 10-01-17 Dr. Kieth Brightly   INGUINAL HERNIA REPAIR Right 10/01/2017   Procedure: RIGHT INGUINAL HERNIA REPAIR WITH MESH;  Surgeon: Kinsinger, Arta Bruce, MD;  [facility];  Service: General;   Laterality: Right;  TAP BLOCK   INSERTION OF MESH Right 10/01/2017   Procedure: INSERTION OF MESH;  Surgeon: Kieth Brightly Arta Bruce, MD;  [facility];  Service: General;  Laterality: Right;   IR THORACENTESIS ASP PLEURAL SPACE W/IMG GUIDE  05/18/2021   IR THORACENTESIS ASP PLEURAL SPACE W/IMG GUIDE  06/07/2021   LEFT HEART CATH AND CORONARY ANGIOGRAPHY N/A 04/20/2021   Procedure: LEFT HEART CATH AND CORONARY ANGIOGRAPHY;  Surgeon: Wellington Hampshire, MD;  Location: [facility];  Service: Cardiovascular;  Laterality: N/A;  TEE WITHOUT CARDIOVERSION N/A 05/08/2021   Procedure: TRANSESOPHAGEAL ECHOCARDIOGRAM (TEE);  Surgeon: Wonda Olds, MD;  Location: [facility];  Service: Open Heart Surgery;  Laterality: N/A;   TONSILLECTOMY     TONSILLECTOMY     VIDEO BRONCHOSCOPY Bilateral 07/26/2015   Procedure: VIDEO BRONCHOSCOPY WITH FLUORO;  Surgeon: [name], MD;  [facility];  Service: Cardiopulmonary;  Laterality: Bilateral;   VIDEO BRONCHOSCOPY WITH ENDOBRONCHIAL NAVIGATION N/A 08/23/2015   Procedure: VIDEO BRONCHOSCOPY WITH ENDOBRONCHIAL NAVIGATION;  Surgeon: Grace Isaac, MD;  [facility];  Service: Thoracic;  Laterality: N/A;   VIDEO BRONCHOSCOPY WITH ENDOBRONCHIAL ULTRASOUND N/A 08/23/2015   Procedure: VIDEO BRONCHOSCOPY WITH ENDOBRONCHIAL ULTRASOUND;  Surgeon: Grace Isaac, MD;  Location[facility];  Service: Thoracic;  Laterality: N/A;    Family History  Problem Relation Age of Onset   Leukemia Father    Emphysema Father    Learning disabilities Son    Atrial fibrillation Son    Leukemia Other    Stroke Other     Allergies  Allergen Reactions   Iodine Other (See Comments)    neck swells   Iohexol Swelling    Neck and gland swelling per patient.    Current Outpatient Medications on File Prior to Visit  Medication Sig Dispense Refill   acetaminophen (TYLENOL) 500 MG tablet Take 1-2 tablets (500-1,000 mg total) by mouth every 6 (six) hours as needed.  30 tablet 0   albuterol (PROVENTIL) (2.5 MG/3ML) 0.083% nebulizer solution Take 3 mLs (2.5 mg total) by nebulization every 6 (six) hours as needed for wheezing or shortness of breath. 75 mL 12   albuterol (VENTOLIN HFA) 108 (90 Base) MCG/ACT inhaler Inhale 2 puffs into the lungs every 6 (six) hours as needed for wheezing or shortness of breath. 8.5 g 5   amiodarone (PACERONE) 200 MG tablet Take 1/2 tablet (100 mg total) by mouth daily. 45 tablet 3   atorvastatin (LIPITOR) 80 MG tablet Take 1 tablet (80 mg total) by mouth daily. 90 tablet 3   Budeson-Glycopyrrol-Formoterol (BREZTRI AEROSPHERE) 160-9-4.8 MCG/ACT AERO Inhale 2 puffs into the lungs in the morning and at bedtime. 10.7 g 5   furosemide (LASIX) 40 MG tablet Take 1 tablet (40 mg total) by mouth daily. 30 tablet 1   gabapentin (NEURONTIN) 300 MG capsule Take 1 capsule (300 mg total) by mouth 3 (three) times daily. (Patient taking differently: Take 300 mg by mouth 2 (two) times daily.) 90 capsule 1   levETIRAcetam (KEPPRA XR) 750 MG 24 hr tablet Take 1 tablet (750 mg total) by mouth at bedtime. 30 tablet 11   Multiple Vitamin (MULTIVITAMIN WITH MINERALS) TABS tablet Take 1 tablet by mouth daily in the afternoon.     omeprazole (PRILOSEC) 40 MG capsule Take 1 capsule (40 mg total) by mouth daily. 90 capsule 1   potassium chloride (KLOR-CON) 10 MEQ tablet TAKE 2 TABLETS BY MOUTH DAILY FOR 2 DAYS THEN REDUCE TO 1 TABLET BY MOUTH DAILY 92 tablet 3   rivaroxaban (XARELTO) 20 MG TABS tablet Take 1 tablet (20 mg total) by mouth daily with supper. Patient is out of Xarelto. Please overnight to him. 90 tablet 0   tamsulosin (FLOMAX) 0.4 MG CAPS capsule Take 1 capsule (0.4 mg total) by mouth daily. 90 capsule 1   [DISCONTINUED] metFORMIN (GLUCOPHAGE) 500 MG tablet Take 1 tablet (500 mg total) by mouth 2 (two) times daily with a meal. 60 tablet 0   No current facility-administered medications on  file prior to visit.    BP 120/62   Pulse 83   Temp  98.5 F (36.9 C)   Resp 18   Ht 5\' 7"  (1.702 m)   Wt 187 lb (84.8 kg)   SpO2 94%   BMI 29.29 kg/m        Objective:   Physical Exam   General Mental Status- Alert. General Appearance- Not in acute distress.   Skin General: Color- Normal Color. Moisture- Normal Moisture.  Neck Carotid Arteries- Normal color. Moisture- Normal Moisture. No carotid bruits. No JVD.  Chest and Lung Exam Auscultation: Breath Sounds:-Normal.  Cardiovascular Auscultation:Rythm- Regular. Murmurs & Other Heart Sounds:Auscultation of the heart reveals- No Murmurs.  Abdomen Inspection:-Inspeection Normal. Palpation/Percussion:Note:No mass. Palpation and Percussion of the abdomen reveal- Non Tender, Non Distended + BS, no rebound or guarding.   Neurologic Cranial Nerve exam:- CN III-XII intact(No nystagmus), symmetric smile. Strength:- 5/5 equal and symmetric strength both upper and lower extremities.       Assessment & Plan:   Patient Instructions  Your appear to have  sinus infection. I am prescribing  augmentin antibiotic for the infection. To help with the nasal congestion I prescribed  flonase nasal steroid.   For your associated cough, I prescribed benzonatate.  For wheezing continue your inhalers.  Your flu test was negative. Your covid test came back positive. Will rx molnupiravir. Rx advisement given. You should improve. Can add on vit D 2000 iu daily. Recommend stay off work for 5 days  Rest, hydrate, tylenol for fever.  Follow up in 7 days or as needed.    Mackie Pai, PA-C

## 2022-09-12 ENCOUNTER — Other Ambulatory Visit (HOSPITAL_BASED_OUTPATIENT_CLINIC_OR_DEPARTMENT_OTHER): Payer: Self-pay

## 2022-09-13 ENCOUNTER — Other Ambulatory Visit (HOSPITAL_BASED_OUTPATIENT_CLINIC_OR_DEPARTMENT_OTHER): Payer: Self-pay

## 2022-09-16 ENCOUNTER — Telehealth: Payer: Self-pay | Admitting: Pharmacist

## 2022-09-16 NOTE — Telephone Encounter (Signed)
Patient called - states he had a call from our office but I don't see any documentation of a call from our office.  Reminded him of upcoming phone appointment 09/19/2022.

## 2022-09-19 ENCOUNTER — Ambulatory Visit (INDEPENDENT_AMBULATORY_CARE_PROVIDER_SITE_OTHER): Payer: Medicare Other | Admitting: Pharmacist

## 2022-09-19 ENCOUNTER — Other Ambulatory Visit (HOSPITAL_BASED_OUTPATIENT_CLINIC_OR_DEPARTMENT_OTHER): Payer: Self-pay

## 2022-09-19 DIAGNOSIS — I25110 Atherosclerotic heart disease of native coronary artery with unstable angina pectoris: Secondary | ICD-10-CM

## 2022-09-19 DIAGNOSIS — J449 Chronic obstructive pulmonary disease, unspecified: Secondary | ICD-10-CM

## 2022-09-19 DIAGNOSIS — I4819 Other persistent atrial fibrillation: Secondary | ICD-10-CM

## 2022-09-19 DIAGNOSIS — I5032 Chronic diastolic (congestive) heart failure: Secondary | ICD-10-CM

## 2022-09-19 MED ORDER — FUROSEMIDE 40 MG PO TABS
40.0000 mg | ORAL_TABLET | Freq: Every day | ORAL | 0 refills | Status: DC
  Filled 2022-09-19: qty 90, 90d supply, fill #0

## 2022-09-19 NOTE — Progress Notes (Signed)
Pharmacy Note  09/19/2022 Name: Robert Bates MRN: [id number] DOB: 04-Jan-1941  Subjective: Robert Bates is a 81 y.o. year old male who is a primary care patient of [name], NP. Clinical Pharmacist Practitioner referral was placed to assist with medication management.    Engaged with patient by telephone for follow up visit today.  Patient has received Xarelto from [employer] - it allowed him to get Xarelto at a discounted rate ($240 for 90 days). This will last until benefits restart in January 2024.  Patient is receiving Breztri from [facility] and Me program. He states he has several inhalers on hand and asked if we can delay the next shipment.  Reviewed med list and refill history.   Objective: Review of patient status, including review of consultants reports, laboratory and other test data, was performed as part of comprehensive evaluation and provision of chronic care management services.   Lab Results  Component Value Date   CREATININE 0.90 07/13/2022   CREATININE 0.96 06/14/2022   CREATININE 0.86 05/07/2022    Lab Results  Component Value Date   HGBA1C 6.4 04/19/2022       Component Value Date/Time   CHOL 140 07/13/2021 1336   TRIG 72.0 07/13/2021 1336   HDL 67.00 07/13/2021 1336   CHOLHDL 2 07/13/2021 1336   VLDL 14.4 07/13/2021 1336   LDLCALC 58 07/13/2021 1336   LDLCALC 62 04/11/2021 1421   LDLDIRECT 142.2 12/26/2008 1032     Clinical ASCVD: Yes  The ASCVD Risk score (Arnett DK, et al., 2019) failed to calculate for the following reasons:   The 2019 ASCVD risk score is only valid for ages 76 to 24    BP Readings from Last 3 Encounters:  09/04/22 120/62  08/16/22 116/70  07/16/22 115/62     Allergies  Allergen Reactions   Iodine Other (See Comments)    neck swells   Iohexol Swelling    Neck and gland swelling per patient.    Medications Reviewed Today     Reviewed by Jeronimo Greaves, CMA (Certified Medical Assistant) on  09/04/22 at [place] List Status: <None>   Medication Order Taking? Sig Documenting Provider Last Dose Status Informant  acetaminophen (TYLENOL) 500 MG tablet [id number] Yes Take 1-2 tablets (500-1,000 mg total) by mouth every 6 (six) hours as needed. Nani Skillern, PA-C Taking Active Multiple Informants, Self, Pharmacy Records  albuterol (PROVENTIL) (2.5 MG/3ML) 0.083% nebulizer solution [id number] Yes Take 3 mLs (2.5 mg total) by nebulization every 6 (six) hours as needed for wheezing or shortness of breath. Spero Geralds, MD Taking Active Multiple Informants, Self, Pharmacy Records           Med Note [facility], [place] B   Thu Jun 06, 2022 11:35 AM)    albuterol (VENTOLIN HFA) 108 (90 Base) MCG/ACT inhaler [id number] Yes Inhale 2 puffs into the lungs every 6 (six) hours as needed for wheezing or shortness of breath. [name], NP Taking Active Multiple Informants, Self, Pharmacy Records           Med Note [place], [place] B   Thu Jun 06, 2022 11:36 AM)    amiodarone (PACERONE) 200 MG tablet [id number] Yes Take 1/2 tablet (100 mg total) by mouth daily. Liliane Shi, PA-C Taking Active Multiple Informants, Self, Pharmacy Records  atorvastatin (LIPITOR) 80 MG tablet [id number] Yes Take 1 tablet (80 mg total) by mouth daily. Nahser, Wonda Cheng, MD Taking Active Multiple Informants, Self, Pharmacy Records  Budeson-Glycopyrrol-Formoterol (BREZTRI AEROSPHERE) 160-9-4.8 MCG/ACT AERO [id number] Yes Inhale 2 puffs into the lungs in the morning and at bedtime. Spero Geralds, MD Taking Active Multiple Informants, Self, Pharmacy Records           Med Note [facility], [place] B   Mon May 13, 2022 11:45 AM)    furosemide (LASIX) 40 MG tablet [id number] Yes Take 1 tablet (40 mg total) by mouth daily. Nita Sells, MD Taking Active   gabapentin (NEURONTIN) 300 MG capsule [id number] Yes Take 1 capsule (300 mg total) by mouth 3 (three) times daily.  Patient taking differently: Take 300 mg by mouth 2  (two) times daily.   [name], NP Taking Active   levETIRAcetam (KEPPRA XR) 750 MG 24 hr tablet [id number] Yes Take 1 tablet (750 mg total) by mouth at bedtime. Alric Ran, MD Taking Active Multiple Informants, Self, Pharmacy Records    Discontinued 05/08/21 1058 Multiple Vitamin (MULTIVITAMIN WITH MINERALS) TABS tablet [id number] Yes Take 1 tablet by mouth daily in the afternoon. [provider] Taking Active Multiple Informants, Self, Pharmacy Records           Med Note [facility], [place] B   Mon May 13, 2022 11:57 AM)    omeprazole (PRILOSEC) 40 MG capsule [id number] Yes Take 1 capsule (40 mg total) by mouth daily. [name], NP Taking Active   potassium chloride (KLOR-CON) 10 MEQ tablet [id number] Yes TAKE 2 TABLETS BY MOUTH DAILY FOR 2 DAYS THEN REDUCE TO 1 TABLET BY MOUTH DAILY Richardson Dopp T, PA-C Taking Active Multiple Informants, Self, Pharmacy Records  rivaroxaban (XARELTO) 20 MG TABS tablet [id number] Yes Take 1 tablet (20 mg total) by mouth daily with supper. Patient is out of Xarelto. Please overnight to him. Mosie Lukes, MD Taking Active   tamsulosin [facility]) 0.4 MG CAPS capsule [id number] Yes Take 1 capsule (0.4 mg total) by mouth daily. 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   Unilateral inguinal hernia without obstruction or gangrene 01/15/2022   COPD exacerbation ([place]) 10/03/2021   Persistent atrial fibrillation ([place]) 05/28/2021   Secondary hypercoagulable state  ([place]) 05/28/2021   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   Coronary artery disease 05/08/2021   COVID-19 virus infection 04/23/2021   Erectile dysfunction 04/11/2021   Head trauma 03/07/2021   Left leg pain 04/07/2020   Benign prostatic hyperplasia with nocturia 03/08/2020   Seizures ([place]) 02/18/2020   PAF (paroxysmal atrial fibrillation) ([place]) 08/15/2017   Diabetes type 2, controlled ([place]) 07/31/2017   COPD GOLD II  04/03/2017   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[place]) 09/06/2015   Lung cancer ([place]) 11/07/2014   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   Essential hypertension 04/29/2014   Meningioma ([place]) 10/25/2013   Low back pain 10/25/2013   Osteoarthritis 08/18/2012   Atypical chest pain 08/14/2011   KERATOSIS 10/09/2010   SCIATICA, RIGHT 10/09/2010   UNSPECIFIED HEARING LOSS 05/21/2010   Hyperlipidemia LDL goal <70 05/14/2010   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  RENAL CYST, RIGHT 02/05/2010   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[place]) 01/29/2010   LIPOMA 11/17/2009   MICROSCOPIC HEMATURIA 08/11/2008   GERD 07/26/2008   DERMOID CYST 07/25/2008     Medication Assistance:   Breztri inhaler obtained through [facility] and Me  medication assistance program.  Enrollment ends 11/03/2022   Assessment/ Plan:  COPD - stable [place] and asked to delay next inhaler shipment. Patient will need to re-enroll in 2024. He states he has about 5 inhalers on hand so will plan to do online re-enrollment when patient comes to office in January 2024.  Afib  Continue to take Xarelto Continue amiodarone 100mg  daily (0.5 tablet of 200mg  tablet)  CHF Meds ordered this encounter  Medications   furosemide (LASIX) 40 MG tablet    Sig: Take 1 tablet (40 mg total) by mouth daily.    Dispense:  90 tablet    Refill:  0   Hyperlipidemia: LDL goal < 55 Continue atorvastatin 80mg  daily - requested refill.    Follow Up:  Telephone follow up appointment with care management team member scheduled for:  2 to 3  months   Cherre Robins, PharmD Clinical Pharmacist [place] [phone]

## 2022-09-20 ENCOUNTER — Telehealth: Payer: Self-pay

## 2022-09-20 NOTE — Telephone Encounter (Signed)
Mailed [place] renewal application to patient home.   Please have office staff fax to [phone] ATTENTION Gunda Maqueda. Patient will be returning to office.   Sandre Kitty Rx Patient Advocate

## 2022-09-23 ENCOUNTER — Telehealth: Payer: Self-pay | Admitting: Family

## 2022-09-23 NOTE — Telephone Encounter (Signed)
Patient called with questions about his medications/side effects. Patient said he has gone to the bathroom 10x today and he said sometimes he goes and can't urinate but still feels like he has to. Please call to discuss.

## 2022-09-24 NOTE — Telephone Encounter (Signed)
Spoke to patient and he wanted to review his medication's list and report he was having nocturia. He was advised to follow up with urology.

## 2022-09-30 ENCOUNTER — Telehealth: Payer: Self-pay | Admitting: Family

## 2022-09-30 NOTE — Telephone Encounter (Signed)
Left message for patient to call back and schedule Medicare Annual Wellness Visit (AWV) either virtually or phone  Left  my Robert Bates number [phone]   Last AWV 10/01/21 please schedule with Nurse Health Adviser 2   45 min for awv-i and in office appointments 30 min for awv-s  phone/virtual appointments

## 2022-10-02 ENCOUNTER — Ambulatory Visit (INDEPENDENT_AMBULATORY_CARE_PROVIDER_SITE_OTHER): Payer: Medicare Other | Admitting: *Deleted

## 2022-10-02 DIAGNOSIS — Z0000 Encounter for general adult medical examination without abnormal findings: Secondary | ICD-10-CM | POA: Diagnosis not present

## 2022-10-02 NOTE — Progress Notes (Signed)
Subjective:   Robert Bates is a 81 y.o. male who presents for Medicare Annual/Subsequent preventive examination.  I connected with  Fransisca Connors on 10/02/22 by a audio enabled telemedicine application and verified that I am speaking with the correct person using two identifiers.  Patient Location: Home  Provider Location: Office/Clinic  I discussed the limitations of evaluation and management by telemedicine. The patient expressed understanding and agreed to proceed.   Review of Systems    Defer to PCP Cardiac Risk Factors include: advanced age (>75men, >30 women);diabetes mellitus;dyslipidemia;hypertension;male gender     Objective:    There were no vitals filed for this visit. There is no height or weight on file to calculate BMI.     10/02/2022    3:46 PM 07/13/2022   11:03 AM 05/06/2022    1:09 PM 10/01/2021   11:08 AM 06/20/2021   11:13 AM 05/14/2021    7:00 PM 05/04/2021    2:55 PM  Advanced Directives  Does Patient Have a Medical Advance Directive? No No No No No No No  Would patient like information on creating a medical advance directive? No - Patient declined  No - Patient declined No - Patient declined No - Patient declined No - Patient declined No - Patient declined;Yes (MAU/Ambulatory/Procedural Areas - Information given)    Current Medications (verified) Outpatient Encounter Medications as of 10/02/2022  Medication Sig   acetaminophen (TYLENOL) 500 MG tablet Take 1-2 tablets (500-1,000 mg total) by mouth every 6 (six) hours as needed.   albuterol (PROVENTIL) (2.5 MG/3ML) 0.083% nebulizer solution Take 3 mLs (2.5 mg total) by nebulization every 6 (six) hours as needed for wheezing or shortness of breath.   albuterol (VENTOLIN HFA) 108 (90 Base) MCG/ACT inhaler Inhale 2 puffs into the lungs every 6 (six) hours as needed for wheezing or shortness of breath.   amiodarone (PACERONE) 200 MG tablet Take 1/2 tablet (100 mg total) by mouth daily.   atorvastatin (LIPITOR)  80 MG tablet Take 1 tablet (80 mg total) by mouth daily.   benzonatate (TESSALON) 100 MG capsule Take 1 capsule (100 mg total) by mouth 3 (three) times daily as needed for cough.   Budeson-Glycopyrrol-Formoterol (BREZTRI AEROSPHERE) 160-9-4.8 MCG/ACT AERO Inhale 2 puffs into the lungs in the morning and at bedtime.   fluticasone (FLONASE) 50 MCG/ACT nasal spray Place 2 sprays into both nostrils daily.   furosemide (LASIX) 40 MG tablet Take 1 tablet (40 mg total) by mouth daily.   gabapentin (NEURONTIN) 300 MG capsule Take 1 capsule (300 mg total) by mouth 3 (three) times daily.   levETIRAcetam (KEPPRA XR) 750 MG 24 hr tablet Take 1 tablet (750 mg total) by mouth at bedtime.   Multiple Vitamin (MULTIVITAMIN WITH MINERALS) TABS tablet Take 1 tablet by mouth daily in the afternoon.   omeprazole (PRILOSEC) 40 MG capsule Take 1 capsule (40 mg total) by mouth daily.   potassium chloride (KLOR-CON) 10 MEQ tablet TAKE 2 TABLETS BY MOUTH DAILY FOR 2 DAYS THEN REDUCE TO 1 TABLET BY MOUTH DAILY (Patient taking differently: Take 10 mEq by mouth daily. TAKE 2 TABLETS BY MOUTH DAILY FOR 2 DAYS THEN REDUCE TO 1 TABLET BY MOUTH DAILY)   rivaroxaban (XARELTO) 20 MG TABS tablet Take 1 tablet (20 mg total) by mouth daily with supper. Patient is out of Xarelto. Please overnight to him.   tamsulosin (FLOMAX) 0.4 MG CAPS capsule Take 1 capsule (0.4 mg total) by mouth daily.   [DISCONTINUED] metFORMIN (GLUCOPHAGE) 500 MG  tablet Take 1 tablet (500 mg total) by mouth 2 (two) times daily with a meal.   No facility-administered encounter medications on file as of 10/02/2022.    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   COPD (chronic obstructive pulmonary disease) (HCC)    Coronary artery disease    Diabetes type 2, controlled ([place]) 07/31/2017   Dyspnea    Dysrhythmia    a fib   GERD (gastroesophageal reflux disease)    Hematuria    refuses work up or referral - understands risks of morbidity / mortality - 11/2008, 12/2008   History of hiatal hernia    History of kidney stones    Hyperlipemia    Meningioma ([place]) 10/25/2013   Follows with Dr. [name].    Peripheral vascular disease ([place])    Abdominal Aortic Aneursym   Pneumonia    as a child   Radiation 09/18/15-10/25/15   left lower lobe 70.2 Gy   Seizures ([place]) 02/18/2020   Tobacco abuse    Past Surgical History:  Procedure Laterality Date   CHOLECYSTECTOMY N/A 07/23/2017   Procedure: LAPAROSCOPIC CHOLECYSTECTOMY;  Surgeon: Mickeal Skinner, MD;  Location: [facility];  Service: General;  Laterality: N/A;   CLIPPING OF ATRIAL APPENDAGE Left 05/08/2021   Procedure: CLIPPING OF ATRIAL APPENDAGE USING 6 ATRICLIP;  Surgeon: Wonda Olds, MD;  Location: [facility];  Service: Open Heart Surgery;  Laterality: Left;   COLONOSCOPY     CORONARY ARTERY BYPASS GRAFT N/A 05/08/2021   Procedure: CORONARY ARTERY BYPASS GRAFTING (CABG)X 3 USING LEFT INTERNAL MAMMARY ARTERY AND RIGHT GREATER SAPEHNOUS VEIN;  Surgeon: Wonda Olds, MD;  Location: [facility];  Service: Open Heart Surgery;  Laterality: N/A;   ENDOVEIN HARVEST OF GREATER SAPHENOUS VEIN Right 05/08/2021   Procedure: ENDOVEIN HARVEST OF GREATER SAPHENOUS VEIN;  Surgeon: Wonda Olds, MD;  Location: [place];  Service: Open Heart Surgery;  Laterality: Right;   EYE SURGERY Bilateral    Cataracts removed w/ lens implant   HERNIA REPAIR     Left 36 years ago . Right inguinal hernia repair 10-01-17 Dr. Kieth Brightly   INGUINAL HERNIA REPAIR Right 10/01/2017   Procedure: RIGHT INGUINAL HERNIA REPAIR WITH MESH;  Surgeon: Kinsinger, Arta Bruce, MD;  Location: [facility];  Service: General;   Laterality: Right;  TAP BLOCK   INSERTION OF MESH Right 10/01/2017   Procedure: INSERTION OF MESH;  Surgeon: Kieth Brightly Arta Bruce, MD;  Location: [facility];  Service: General;  Laterality: Right;   IR THORACENTESIS ASP PLEURAL SPACE W/IMG GUIDE  05/18/2021   IR THORACENTESIS ASP PLEURAL SPACE W/IMG GUIDE  06/07/2021   LEFT HEART CATH AND CORONARY ANGIOGRAPHY N/A 04/20/2021   Procedure: LEFT HEART CATH AND CORONARY ANGIOGRAPHY;  Surgeon: Wellington Hampshire, MD;  Location: [facility];  Service: Cardiovascular;  Laterality: N/A;   TEE WITHOUT CARDIOVERSION N/A 05/08/2021   Procedure: TRANSESOPHAGEAL ECHOCARDIOGRAM (TEE);  Surgeon: Wonda Olds, MD;  Location: [place];  Service: Open Heart Surgery;  Laterality: N/A;   TONSILLECTOMY     TONSILLECTOMY     VIDEO BRONCHOSCOPY Bilateral 07/26/2015   Procedure: VIDEO BRONCHOSCOPY WITH  FLUORO;  Surgeon: [name], MD;  Location: [name];  Service: Cardiopulmonary;  Laterality: Bilateral;   VIDEO BRONCHOSCOPY WITH ENDOBRONCHIAL NAVIGATION N/A 08/23/2015   Procedure: VIDEO BRONCHOSCOPY WITH ENDOBRONCHIAL NAVIGATION;  Surgeon: Grace Isaac, MD;  Location: [facility];  Service: Thoracic;  Laterality: N/A;   VIDEO BRONCHOSCOPY WITH ENDOBRONCHIAL ULTRASOUND N/A 08/23/2015   Procedure: VIDEO BRONCHOSCOPY WITH ENDOBRONCHIAL ULTRASOUND;  Surgeon: Grace Isaac, MD;  Location: [facility];  Service: Thoracic;  Laterality: N/A;   Family History  Problem Relation Age of Onset   Leukemia Father    Emphysema Father    Learning disabilities Son    Atrial fibrillation Son    Leukemia Other    Stroke Other    Social History   Socioeconomic History   Marital status: Married    Spouse name: Not on file   Number of children: 2   Years of education: Not on file   Highest education level: Not on file  Occupational History   Occupation: Retired    Fish farm manager: [employer]    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[employer]  Tobacco Use   Smoking status:  Former    Packs/day: 1.00    Years: 57.00    Total pack years: 57.00    Types: Cigarettes, Cigars    Quit date: 08/08/2015    Years since quitting: 7.1   Smokeless tobacco: Former    Types: Chew    Quit date: 11/04/1958   Tobacco comments:    Will smoke cigar every once in awhile.  Vaping daily started a couple weeks ago.  04/18/22 hfb  Vaping Use   Vaping Use: Former  Substance and Sexual Activity   Alcohol use: Not Currently    Alcohol/week: 0.0 standard drinks of alcohol   Drug use: No   Sexual activity: Not Currently  Other Topics Concern   Not on file  Social History Narrative   Not on file   Social Determinants of Health   Financial Resource Strain: Medium Risk (11/22/2021)   Overall Financial Resource Strain (CARDIA)    Difficulty of Paying Living Expenses: Somewhat hard  Food Insecurity: No Food Insecurity (10/02/2022)   Hunger Vital Sign    Worried About Running Out of Food in the Last Year: Never true    Ran Out of Food in the Last Year: Never true  Transportation Needs: No Transportation Needs (05/13/2022)   PRAPARE - Hydrologist (Medical): No    Lack of Transportation (Non-Medical): No  Physical Activity: Insufficiently Active (08/05/2021)   Exercise Vital Sign    Days of Exercise per Week: 2 days    Minutes of Exercise per Session: 40 min  Stress: No Stress Concern Present (10/01/2021)   [place]    Feeling of Stress : Not at all  Social Connections: Moderately Isolated (05/13/2022)   Social Connection and Isolation Panel [NHANES]    Frequency of Communication with Friends and Family: Twice a week    Frequency of Social Gatherings with Friends and Family: Once a week    Attends Religious Services: Never    Marine scientist or Organizations: No    Attends Archivist Meetings: Never    Marital Status: Married    Tobacco Counseling Counseling given:  Not Answered Tobacco comments: Will smoke cigar every once in awhile.  Vaping daily started a couple weeks ago.  04/18/22 hfb   Clinical Intake:  Pre-visit preparation completed: Yes  Pain : No/denies pain  How often do you need to have someone help you when you read instructions, pamphlets, or other written materials from your doctor or pharmacy?: 1 - Never  Diabetic? Nutrition Risk Assessment:  Has the patient had any N/V/D within the last 2 months?  No  Does the patient have any non-healing wounds?  No  Has the patient had any unintentional weight loss or weight gain?  No   Diabetes:  Is the patient diabetic?  Yes  If diabetic, was a CBG obtained today?  No  Did the patient bring in their glucometer from home?   No, audio visit How often do you monitor your CBG's? Never.   Financial Strains and Diabetes Management:  Are you having any financial strains with the device, your supplies or your medication? No .  Does the patient want to be seen by Chronic Care Management for management of their diabetes?  No  Would the patient like to be referred to a Nutritionist or for Diabetic Management?  No   Diabetic Exams:  Diabetic Eye Exam: Overdue for diabetic eye exam. Pt has been advised about the importance in completing this exam. Patient advised to call and schedule an eye exam. Diabetic Foot Exam: Overdue, Pt has been advised about the importance in completing this exam. Pt is scheduled for diabetic foot exam on N/a.  Interpreter Needed?: No  Information entered by :: Beatris Ship, [place]   Activities of Daily Living    10/02/2022    3:54 PM 05/07/2022    1:50 AM  In your present state of health, do you have any difficulty performing the following activities:  Hearing? 1 1  Comment wears hearing aids   Vision? 0 0  Difficulty concentrating or making decisions? 1 0  Walking or climbing stairs? 0 0  Dressing or bathing? 0 1  Comment  patient get short of breath when  dressing  Doing errands, shopping? 0 0  Preparing Food and eating ? N   Using the Toilet? N   In the past six months, have you accidently leaked urine? Y   Do you have problems with loss of bowel control? N   Managing your Medications? N   Managing your Finances? N   Housekeeping or managing your Housekeeping? N     Patient Care Team: [name], NP as PCP - General Nahser, Wonda Cheng, MD as PCP - Cardiology (Cardiology) Elsie Stain, MD as Attending Physician (Pulmonary Disease) Virgina Evener, OD (Optometry) Cherre Robins, RPH-CPP (Pharmacist) Sharmon Revere as Physician Assistant (Cardiology) Spero Geralds, MD as Consulting Physician (Pulmonary Disease)  Indicate any recent Medical Services you may have received from other than [facility] providers in the past year (date may be approximate).     Assessment:   This is a routine wellness examination for [place].  Hearing/Vision screen No results found.  Dietary issues and exercise activities discussed: Current Exercise Habits: The patient does not participate in regular exercise at present, Exercise limited by: None identified   Goals Addressed   None    Depression Screen    10/02/2022    3:54 PM 10/01/2021   11:11 AM 12/08/2020    3:39 PM 01/28/2018    9:52 AM 01/27/2017    9:39 AM 10/14/2016   10:41 AM 08/26/2016    1:29 PM  PHQ 2/9 Scores  PHQ - 2 Score 0 0 0 0 0 0 0    Fall  Risk    10/02/2022    3:47 PM 10/01/2021   11:09 AM 07/13/2021    1:02 PM 06/01/2020   10:11 AM 01/28/2018    9:52 AM  Fall Risk   Falls in the past year? 0 0 0 0 No  Number falls in past yr: 0 0 0 0   Injury with Fall? 0 0 0 0   Risk for fall due to : No Fall Risks      Follow up Falls evaluation completed Falls prevention discussed       FALL RISK PREVENTION PERTAINING TO THE HOME:  Any stairs in or around the home? Yes  If so, are there any without handrails? No  Home free of loose throw rugs in walkways, pet beds,  electrical cords, etc? Yes  Adequate lighting in your home to reduce risk of falls? Yes   ASSISTIVE DEVICES UTILIZED TO PREVENT FALLS:  Life alert? No  Use of a cane, walker or w/c? No  Grab bars in the bathroom? Yes  Shower chair or bench in shower? No  Elevated toilet seat or a handicapped toilet? No   TIMED UP AND GO:  Was the test performed?  no, audio visit .     Cognitive Function:        10/02/2022    4:03 PM  6CIT Screen  What Year? 0 points  What month? 0 points  What time? 0 points  Count back from 20 0 points  Months in reverse 4 points  Repeat phrase 2 points  Total Score 6 points    Immunizations Immunization History  Administered Date(s) Administered   Fluad Quad(high Dose 65+) 07/23/2019, 09/04/2020, 07/13/2021, 08/16/2022   Influenza Split 08/02/2011, 08/18/2012   Influenza Whole 08/17/2008   Influenza, High Dose Seasonal PF 08/09/2015, 09/30/2016, 07/30/2017, 08/07/2018   Influenza,inj,Quad PF,6+ Mos 07/27/2013, 08/09/2014   PFIZER Comirnaty(Gray Top)Covid-19 Tri-Sucrose Vaccine 02/16/2021   PFIZER(Purple Top)SARS-COV-2 Vaccination 01/28/2020, 02/21/2020, 09/04/2020   Pfizer Covid-19 Vaccine Bivalent Booster 51yrs & up 08/03/2021   Pneumococcal Conjugate-13 08/09/2014   Pneumococcal Polysaccharide-23 05/14/2010   Td 07/25/2008    TDAP status: Due, Education has been provided regarding the importance of this vaccine. Advised may receive this vaccine at local pharmacy or Health Dept. Aware to provide a copy of the vaccination record if obtained from local pharmacy or Health Dept. Verbalized acceptance and understanding.  Flu Vaccine status: Up to date  Pneumococcal vaccine status: Up to date  Covid-19 vaccine status: Information provided on how to obtain vaccines.   Qualifies for Shingles Vaccine? Yes   Zostavax completed No   Shingrix Completed?: No.    Education has been provided regarding the importance of this vaccine. Patient has been  advised to call insurance company to determine out of pocket expense if they have not yet received this vaccine. Advised may also receive vaccine at local pharmacy or Health Dept. Verbalized acceptance and understanding.  Screening Tests Health Maintenance  Topic Date Due   Zoster Vaccines- Shingrix (1 of 2) Never done   DTaP/Tdap/Td (2 - Tdap) 07/25/2018   FOOT EXAM  04/11/2022   OPHTHALMOLOGY EXAM  06/16/2022   COVID-19 Vaccine (6 - 2023-24 season) 07/05/2022   Medicare Annual Wellness (AWV)  10/01/2022   HEMOGLOBIN A1C  10/19/2022   Diabetic kidney evaluation - Urine ACR  01/16/2023   Diabetic kidney evaluation - GFR measurement  07/14/2023   Pneumonia Vaccine 80+ Years old  Completed   INFLUENZA VACCINE  Completed   HPV  [place] Maintenance  Health Maintenance Due  Topic Date Due   Zoster Vaccines- Shingrix (1 of 2) Never done   DTaP/Tdap/Td (2 - Tdap) 07/25/2018   FOOT EXAM  04/11/2022   OPHTHALMOLOGY EXAM  06/16/2022   COVID-19 Vaccine (6 - 2023-24 season) 07/05/2022   Medicare Annual Wellness (AWV)  10/01/2022    Colorectal cancer screening: No longer required.   Lung Cancer Screening: (Low Dose CT Chest recommended if Age 19-80 years, 30 pack-year currently smoking OR have quit w/in 15years.) does qualify.   Lung Cancer Screening Referral: N/a  Additional Screening:  Hepatitis C Screening: does not qualify  Vision Screening: Recommended annual ophthalmology exams for early detection of glaucoma and other disorders of the eye. Is the patient up to date with their annual eye exam?  Yes  Who is the provider or what is the name of the office in which the patient attends annual eye exams? [facility] If pt is not established with a provider, would they like to be referred to a provider to establish care? No .   Dental Screening: Recommended annual dental exams for proper oral hygiene  Community Resource Referral / Chronic Care Management: CRR  required this visit?  No   CCM required this visit?  No      Plan:     I have personally reviewed and noted the following in the patient's chart:   Medical and social history Use of alcohol, tobacco or illicit drugs  Current medications and supplements including opioid prescriptions. Patient is not currently taking opioid prescriptions. Functional ability and status Nutritional status Physical activity Advanced directives List of other physicians Hospitalizations, surgeries, and ER visits in previous 12 months Vitals Screenings to include cognitive, depression, and falls Referrals and appointments  In addition, I have reviewed and discussed with patient certain preventive protocols, quality metrics, and best practice recommendations. A written personalized care plan for preventive services as well as general preventive health recommendations were provided to patient.   Due to this being a telephonic visit, the after visit summary with patients personalized plan was offered to patient via mail or my-chart.  Patient would like to access on my-chart.  Beatris Ship, [place]   10/02/2022   Nurse Notes: None

## 2022-10-02 NOTE — Patient Instructions (Signed)
Robert Bates , Thank you for taking time to come for your Medicare Wellness Visit. I appreciate your ongoing commitment to your health goals. Please review the following plan we discussed and let me know if I can assist you in the future.   These are the goals we discussed:  Goals      Patient Stated     Would like to work more than one day per week        This is a list of the screening recommended for you and due dates:  Health Maintenance  Topic Date Due   Zoster (Shingles) Vaccine (1 of 2) Never done   DTaP/Tdap/Td vaccine (2 - Tdap) 07/25/2018   Complete foot exam   04/11/2022   Eye exam for diabetics  06/16/2022   COVID-19 Vaccine (6 - 2023-24 season) 07/05/2022   Hemoglobin A1C  10/19/2022   Yearly kidney health urinalysis for diabetes  01/16/2023   Yearly kidney function blood test for diabetes  07/14/2023   Medicare Annual Wellness Visit  10/03/2023   Pneumonia Vaccine  Completed   Flu Shot  Completed   HPV Vaccine  Aged Out     Next appointment: Follow up in one year for your annual wellness visit.   Preventive Care 70 Years and Older, Male Preventive care refers to lifestyle choices and visits with your health care provider that can promote health and wellness. What does preventive care include? A yearly physical exam. This is also called an annual well check. Dental exams once or twice a year. Routine eye exams. Ask your health care provider how often you should have your eyes checked. Personal lifestyle choices, including: Daily care of your teeth and gums. Regular physical activity. Eating a healthy diet. Avoiding tobacco and drug use. Limiting alcohol use. Practicing safe sex. Taking low doses of aspirin every day. Taking vitamin and mineral supplements as recommended by your health care provider. What happens during an annual well check? The services and screenings done by your health care provider during your annual well check will depend on your age,  overall health, lifestyle risk factors, and family history of disease. Counseling  Your health care provider may ask you questions about your: Alcohol use. Tobacco use. Drug use. Emotional well-being. Home and relationship well-being. Sexual activity. Eating habits. History of falls. Memory and ability to understand (cognition). Work and work Statistician. Screening  You may have the following tests or measurements: Height, weight, and BMI. Blood pressure. Lipid and cholesterol levels. These may be checked every 5 years, or more frequently if you are over 9 years old. Skin check. Lung cancer screening. You may have this screening every year starting at age 4 if you have a 30-pack-year history of smoking and currently smoke or have quit within the past 15 years. Fecal occult blood test (FOBT) of the stool. You may have this test every year starting at age 45. Flexible sigmoidoscopy or colonoscopy. You may have a sigmoidoscopy every 5 years or a colonoscopy every 10 years starting at age 13. Prostate cancer screening. Recommendations will vary depending on your family history and other risks. Hepatitis C blood test. Hepatitis B blood test. Sexually transmitted disease (STD) testing. Diabetes screening. This is done by checking your blood sugar (glucose) after you have not eaten for a while (fasting). You may have this done every 1-3 years. 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AAA) screening. You may need this if you are a current or former smoker. Osteoporosis. You may be screened starting  at age 81 if you are at high risk. Talk with your health care provider about your test results, treatment options, and if necessary, the need for more tests. Vaccines  Your health care provider may recommend certain vaccines, such as: Influenza vaccine. This is recommended every year. Tetanus, diphtheria, and acellular pertussis (Tdap, Td) vaccine. You may need a Td booster every 10 years. Zoster vaccine.  You may need this after age 56. Pneumococcal 13-valent conjugate (PCV13) vaccine. One dose is recommended after age 76. Pneumococcal polysaccharide (PPSV23) vaccine. One dose is recommended after age 78. Talk to your health care provider about which screenings and vaccines you need and how often you need them. This information is not intended to replace advice given to you by your health care provider. Make sure you discuss any questions you have with your health care provider. Document Released: 11/17/2015 Document Revised: 07/10/2016 Document Reviewed: 08/22/2015 Elsevier Interactive Patient Education  2017 [place] Prevention in the Home Falls can cause injuries. They can happen to people of all ages. There are many things you can do to make your home safe and to help prevent falls. What can I do on the outside of my home? Regularly fix the edges of walkways and driveways and fix any cracks. Remove anything that might make you trip as you walk through a door, such as a raised step or threshold. Trim any bushes or trees on the path to your home. Use bright outdoor lighting. Clear any walking paths of anything that might make someone trip, such as rocks or tools. Regularly check to see if handrails are loose or broken. Make sure that both sides of any steps have handrails. Any raised decks and porches should have guardrails on the edges. Have any leaves, snow, or ice cleared regularly. Use sand or salt on walking paths during winter. Clean up any spills in your garage right away. This includes oil or grease spills. What can I do in the bathroom? Use night lights. Install grab bars by the toilet and in the tub and shower. Do not use towel bars as grab bars. Use non-skid mats or decals in the tub or shower. If you need to sit down in the shower, use a plastic, non-slip stool. Keep the floor dry. Clean up any water that spills on the floor as soon as it happens. Remove soap  buildup in the tub or shower regularly. Attach bath mats securely with double-sided non-slip rug tape. Do not have throw rugs and other things on the floor that can make you trip. What can I do in the bedroom? Use night lights. Make sure that you have a light by your bed that is easy to reach. Do not use any sheets or blankets that are too big for your bed. They should not hang down onto the floor. Have a firm chair that has side arms. You can use this for support while you get dressed. Do not have throw rugs and other things on the floor that can make you trip. What can I do in the kitchen? Clean up any spills right away. Avoid walking on wet floors. Keep items that you use a lot in easy-to-reach places. If you need to reach something above you, use a strong step stool that has a grab bar. Keep electrical cords out of the way. Do not use floor polish or wax that makes floors slippery. If you must use wax, use non-skid floor wax. Do not have throw rugs  and other things on the floor that can make you trip. What can I do with my stairs? Do not leave any items on the stairs. Make sure that there are handrails on both sides of the stairs and use them. Fix handrails that are broken or loose. Make sure that handrails are as long as the stairways. Check any carpeting to make sure that it is firmly attached to the stairs. Fix any carpet that is loose or worn. Avoid having throw rugs at the top or bottom of the stairs. If you do have throw rugs, attach them to the floor with carpet tape. Make sure that you have a light switch at the top of the stairs and the bottom of the stairs. If you do not have them, ask someone to add them for you. What else can I do to help prevent falls? Wear shoes that: Do not have high heels. Have rubber bottoms. Are comfortable and fit you well. Are closed at the toe. Do not wear sandals. If you use a stepladder: Make sure that it is fully opened. Do not climb a closed  stepladder. Make sure that both sides of the stepladder are locked into place. Ask someone to hold it for you, if possible. Clearly mark and make sure that you can see: Any grab bars or handrails. First and last steps. Where the edge of each step is. Use tools that help you move around (mobility aids) if they are needed. These include: Canes. Walkers. Scooters. Crutches. Turn on the lights when you go into a dark area. Replace any light bulbs as soon as they burn out. Set up your furniture so you have a clear path. Avoid moving your furniture around. If any of your floors are uneven, fix them. If there are any pets around you, be aware of where they are. Review your medicines with your doctor. Some medicines can make you feel dizzy. This can increase your chance of falling. Ask your doctor what other things that you can do to help prevent falls. This information is not intended to replace advice given to you by your health care provider. Make sure you discuss any questions you have with your health care provider. Document Released: 08/17/2009 Document Revised: 03/28/2016 Document Reviewed: 11/25/2014 Elsevier Interactive Patient Education  2017 [employer].

## 2022-10-04 ENCOUNTER — Other Ambulatory Visit (HOSPITAL_BASED_OUTPATIENT_CLINIC_OR_DEPARTMENT_OTHER): Payer: Self-pay

## 2022-10-06 ENCOUNTER — Encounter: Payer: Self-pay | Admitting: Cardiovascular Disease

## 2022-10-06 NOTE — Progress Notes (Unsigned)
Cardiology Office Note:    Date:  10/07/2022   ID:  Robert Bates, DOB [birth year], MRN [id number]  PCP:  [name], NP  Cardiologist:  Mertie Moores, MD    Referring MD: [name], NP   Chief Complaint  Patient presents with   Coronary Artery Disease   Hypertension    Previous notes:     Robert Bates is a 81 y.o. male with a hx of  PAF. He has documented PAF on monitor Cannot tell that he is in AF  I met him in the hospital  He has a history of tobacco abuse, alcoholism, lung cancer, COPD, peripheral vascular disease, abdominal aortic aneurysm, hypertension   Nov, 5, 2021: Robert Bates is seen today for follow of his PAF, tobacco abuse, alcoholism, lung CA, COPD, AAA HTN Still working , truck driving   On xarelto 20 mg a day  As a thoracic aneurism followed by dr. Inda Merlin    Dec. 14, 2022: Robert Bates is seen today for worsening shortness of breath  Hx of cigarette smoking, lung CA, COPD, AAA , HTN  He has had more dyspnea recently  Also some atypical CP and DOE   Has PAF - is on Xarelto  Has lots of DOE and wheezing .  Still smoking  Uses inhalers   Had CABG July 5, with L atrial clipping   Had post op Atrial fib   Dec. 4, 2023 Robert Bates is seen for follow up of his shortness of breath, AAA,COPD,HTN  Still driving turck Quick smoking ,  vapes occasion  No cp, Not able to exercise .   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   COPD (chronic obstructive pulmonary disease) (HCC)    Coronary artery disease    Diabetes type 2, controlled ([place]) 07/31/2017   Dyspnea    Dysrhythmia    a fib   GERD (gastroesophageal reflux disease)    Hematuria     refuses work up or referral - understands risks of morbidity / mortality - 11/2008, 12/2008   History of hiatal hernia    History of kidney stones    Hyperlipemia    Meningioma ([place]) 10/25/2013   Follows with Dr. [name].    Peripheral vascular disease ([place])    Abdominal Aortic Aneursym   Pneumonia    as a child   Radiation 09/18/15-10/25/15   left lower lobe 70.2 Gy   Seizures ([place]) 02/18/2020   Tobacco abuse     Past Surgical History:  Procedure Laterality Date   CHOLECYSTECTOMY N/A 07/23/2017   Procedure: LAPAROSCOPIC CHOLECYSTECTOMY;  Surgeon: Mickeal Skinner, MD;  Location: [facility];  Service: General;  Laterality: N/A;   CLIPPING OF ATRIAL APPENDAGE Left 05/08/2021   Procedure: CLIPPING OF ATRIAL APPENDAGE USING 69 ATRICLIP;  Surgeon: Wonda Olds, MD;  Location: [facility];  Service: Open Heart Surgery;  Laterality: Left;   COLONOSCOPY     CORONARY ARTERY BYPASS GRAFT N/A 05/08/2021   Procedure: CORONARY ARTERY BYPASS GRAFTING (CABG)X 3 USING LEFT INTERNAL MAMMARY ARTERY AND RIGHT GREATER SAPEHNOUS VEIN;  Surgeon: Wonda Olds, MD;  [facility];  Service: Open Heart  Surgery;  Laterality: N/A;   ENDOVEIN HARVEST OF GREATER SAPHENOUS VEIN Right 05/08/2021   Procedure: ENDOVEIN HARVEST OF GREATER SAPHENOUS VEIN;  Surgeon: Wonda Olds, MD;  Location: [facility];  Service: Open Heart Surgery;  Laterality: Right;   EYE SURGERY Bilateral    Cataracts removed w/ lens implant   HERNIA REPAIR     Left 36 years ago . Right inguinal hernia repair 10-01-17 Dr. Kieth Brightly   INGUINAL HERNIA REPAIR Right 10/01/2017   Procedure: RIGHT INGUINAL HERNIA REPAIR WITH MESH;  Surgeon: Kinsinger, Arta Bruce, MD;  Location: [facility];  Service: General;  Laterality: Right;  TAP BLOCK   INSERTION OF MESH Right 10/01/2017   Procedure: INSERTION OF MESH;  Surgeon: Kieth Brightly Arta Bruce, MD;  [facility];  Service: General;  Laterality: Right;   IR THORACENTESIS ASP PLEURAL SPACE W/IMG  GUIDE  05/18/2021   IR THORACENTESIS ASP PLEURAL SPACE W/IMG GUIDE  06/07/2021   LEFT HEART CATH AND CORONARY ANGIOGRAPHY N/A 04/20/2021   Procedure: LEFT HEART CATH AND CORONARY ANGIOGRAPHY;  Surgeon: Wellington Hampshire, MD;  Location: [facility];  Service: Cardiovascular;  Laterality: N/A;   TEE WITHOUT CARDIOVERSION N/A 05/08/2021   Procedure: TRANSESOPHAGEAL ECHOCARDIOGRAM (TEE);  Surgeon: Wonda Olds, MD;  Location: [facility];  Service: Open Heart Surgery;  Laterality: N/A;   TONSILLECTOMY     TONSILLECTOMY     VIDEO BRONCHOSCOPY Bilateral 07/26/2015   Procedure: VIDEO BRONCHOSCOPY WITH FLUORO;  Surgeon: [name], MD;  [facility];  Service: Cardiopulmonary;  Laterality: Bilateral;   VIDEO BRONCHOSCOPY WITH ENDOBRONCHIAL NAVIGATION N/A 08/23/2015   Procedure: VIDEO BRONCHOSCOPY WITH ENDOBRONCHIAL NAVIGATION;  Surgeon: Grace Isaac, MD;  Location: [facility];  Service: Thoracic;  Laterality: N/A;   VIDEO BRONCHOSCOPY WITH ENDOBRONCHIAL ULTRASOUND N/A 08/23/2015   Procedure: VIDEO BRONCHOSCOPY WITH ENDOBRONCHIAL ULTRASOUND;  Surgeon: Grace Isaac, MD;  [facility];  Service: Thoracic;  Laterality: N/A;    Current Medications: Current Meds  Medication Sig   acetaminophen (TYLENOL) 500 MG tablet Take 1-2 tablets (500-1,000 mg total) by mouth every 6 (six) hours as needed.   albuterol (PROVENTIL) (2.5 MG/3ML) 0.083% nebulizer solution Take 3 mLs (2.5 mg total) by nebulization every 6 (six) hours as needed for wheezing or shortness of breath.   albuterol (VENTOLIN HFA) 108 (90 Base) MCG/ACT inhaler Inhale 2 puffs into the lungs every 6 (six) hours as needed for wheezing or shortness of breath.   amiodarone (PACERONE) 200 MG tablet Take 1/2 tablet (157m) by mouth three days per week (M,W,F)   atorvastatin (LIPITOR) 80 MG tablet Take 1 tablet (80 mg total) by mouth daily.   benzonatate (TESSALON) 100 MG capsule Take 1 capsule (100 mg total) by mouth 3 (three) times  daily as needed for cough.   Budeson-Glycopyrrol-Formoterol (BREZTRI AEROSPHERE) 160-9-4.8 MCG/ACT AERO Inhale 2 puffs into the lungs in the morning and at bedtime.   fluticasone (FLONASE) 50 MCG/ACT nasal spray Place 2 sprays into both nostrils daily.   furosemide (LASIX) 40 MG tablet Take 1 tablet (40 mg total) by mouth daily.   gabapentin (NEURONTIN) 300 MG capsule Take 1 capsule (300 mg total) by mouth 3 (three) times daily.   levETIRAcetam (KEPPRA XR) 750 MG 24 hr tablet Take 1 tablet (750 mg total) by mouth at bedtime.   Multiple Vitamin (MULTIVITAMIN WITH MINERALS) TABS tablet Take 1 tablet by mouth daily in the afternoon.   omeprazole (PRILOSEC) 40 MG capsule Take 1 capsule (40 mg  total) by mouth daily.   potassium chloride (KLOR-CON) 10 MEQ tablet TAKE 2 TABLETS BY MOUTH DAILY FOR 2 DAYS THEN REDUCE TO 1 TABLET BY MOUTH DAILY (Patient taking differently: 10 mEq daily.)   rivaroxaban (XARELTO) 20 MG TABS tablet Take 1 tablet (20 mg total) by mouth daily with supper. Patient is out of Xarelto. Please overnight to him.   tamsulosin (FLOMAX) 0.4 MG CAPS capsule Take 1 capsule (0.4 mg total) by mouth daily.   [DISCONTINUED] amiodarone (PACERONE) 200 MG tablet Take 1/2 tablet (100 mg total) by mouth daily.     Allergies:   Iodine and Iohexol   Social History   Socioeconomic History   Marital status: Married    Spouse name: Not on file   Number of children: 2   Years of education: Not on file   Highest education level: Not on file  Occupational History   Occupation: Retired    Fish farm manager: [employer]    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[employer] Use   Smoking status: Former    Packs/day: 1.00    Years: 57.00    Total pack years: 57.00    Types: Cigarettes, Cigars    Quit date: 08/08/2015    Years since quitting: 7.1   Smokeless tobacco: Former    Types: Chew    Quit date: 11/04/1958   Tobacco comments:    Will smoke cigar every once in awhile.  Vaping daily  started a couple weeks ago.  04/18/22 hfb  Vaping Use   Vaping Use: Former  Substance and Sexual Activity   Alcohol use: Not Currently    Alcohol/week: 0.0 standard drinks of alcohol   Drug use: No   Sexual activity: Not Currently  Other Topics Concern   Not on file  Social History Narrative   Not on file   Social Determinants of Health   Financial Resource Strain: Medium Risk (11/22/2021)   Overall Financial Resource Strain (CARDIA)    Difficulty of Paying Living Expenses: Somewhat hard  Food Insecurity: No Food Insecurity (10/02/2022)   Hunger Vital Sign    Worried About Running Out of Food in the Last Year: Never true    Ran Out of Food in the Last Year: Never true  Transportation Needs: No Transportation Needs (05/13/2022)   PRAPARE - Hydrologist (Medical): No    Lack of Transportation (Non-Medical): No  Physical Activity: Insufficiently Active (08/05/2021)   Exercise Vital Sign    Days of Exercise per Week: 2 days    Minutes of Exercise per Session: 40 min  Stress: No Stress Concern Present (10/01/2021)   [place]    Feeling of Stress : Not at all  Social Connections: Moderately Isolated (05/13/2022)   Social Connection and Isolation Panel [NHANES]    Frequency of Communication with Friends and Family: Twice a week    Frequency of Social Gatherings with Friends and Family: Once a week    Attends Religious Services: Never    Marine scientist or Organizations: No    Attends Music therapist: Never    Marital Status: Married     Family History: The patient's family history includes Atrial fibrillation in his son; Emphysema in his father; Learning disabilities in his son; Leukemia in his father and another family member; Stroke in an other family member. ROS:   Please see the history of present illness.  All other systems reviewed and are  negative.  EKGs/Labs/Other Studies Reviewed:    The following studies were reviewed today:   EKG:     Recent Labs: 04/05/2022: TSH 3.620 05/07/2022: B Natriuretic Peptide 203.6; Magnesium 2.0 07/13/2022: ALT 14; BUN 18; Creatinine, Ser 0.90; Hemoglobin 12.3; Platelets 248; Potassium 4.0; Sodium 138  Recent Lipid Panel    Component Value Date/Time   CHOL 140 07/13/2021 1336   TRIG 72.0 07/13/2021 1336   HDL 67.00 07/13/2021 1336   CHOLHDL 2 07/13/2021 1336   VLDL 14.4 07/13/2021 1336   LDLCALC 58 07/13/2021 1336   LDLCALC 62 04/11/2021 1421   LDLDIRECT 142.2 12/26/2008 1032    Physical Exam:    Physical Exam: Blood pressure 122/70, pulse 66, height _0  (1.702 m), weight 189 lb 6.4 oz (85.9 kg), SpO2 95 %.       GEN:  Well nourished, well developed in no acute distress HEENT: Normal NECK: No JVD; No carotid bruits LYMPHATICS: No lymphadenopathy CARDIAC: RRR , no murmurs, rubs, gallops RESPIRATORY:  Clear to auscultation without rales, wheezing or rhonchi  ABDOMEN: Soft, non-tender, non-distended MUSCULOSKELETAL:  No edema; No deformity  SKIN: Warm and dry NEUROLOGIC:  Alert and oriented x 3   ASSESSMENT:    1. Aneurysm of ascending aorta without rupture ([place])   2. Coronary artery disease involving native coronary artery of native heart without angina pectoris   3. Mixed hyperlipidemia   4. Hyperlipidemia LDL goal <70   5. Hx of CABG      PLAN:      1.  Paroxysmal atrial fibrillation:   is maintaining NSR .   Will reduce amio to 100 mg a day on M.W, F    2. Hyperlipidemia:   draw labs today , cont current meds.  Advised careful diet    3. Lung cancer:    4.  CAD :  s/p CABG ;  no angina   5.  Wheezing :  / pseudowheezing .      Medication Adjustments/Labs and Tests Ordered: Current medicines are reviewed at length with the patient today.  Concerns regarding medicines are outlined above.  Orders Placed This Encounter  Procedures   Lipid panel    ALT   Basic metabolic panel    Meds ordered this encounter  Medications   amiodarone (PACERONE) 200 MG tablet    Sig: Take 1/2 tablet (157m) by mouth three days per week (M,W,F)    Dispense:  6 tablet    Refill:  3       Signed, PMertie Moores MD  10/07/2022 11:20 AM    CRemy

## 2022-10-07 ENCOUNTER — Encounter: Payer: Self-pay | Admitting: Cardiovascular Disease

## 2022-10-07 ENCOUNTER — Other Ambulatory Visit (HOSPITAL_BASED_OUTPATIENT_CLINIC_OR_DEPARTMENT_OTHER): Payer: Self-pay

## 2022-10-07 ENCOUNTER — Ambulatory Visit: Payer: Medicare Other | Attending: Cardiovascular Disease | Admitting: Cardiovascular Disease

## 2022-10-07 VITALS — BP 122/70 | HR 66 | Ht 67.0 in | Wt 189.4 lb

## 2022-10-07 DIAGNOSIS — Z951 Presence of aortocoronary bypass graft: Secondary | ICD-10-CM

## 2022-10-07 DIAGNOSIS — I2510 Atherosclerotic heart disease of native coronary artery without angina pectoris: Secondary | ICD-10-CM

## 2022-10-07 DIAGNOSIS — E782 Mixed hyperlipidemia: Secondary | ICD-10-CM | POA: Diagnosis not present

## 2022-10-07 DIAGNOSIS — I7121 Aneurysm of the ascending aorta, without rupture: Secondary | ICD-10-CM

## 2022-10-07 DIAGNOSIS — E785 Hyperlipidemia, unspecified: Secondary | ICD-10-CM | POA: Diagnosis not present

## 2022-10-07 MED ORDER — AMIODARONE HCL 200 MG PO TABS
ORAL_TABLET | ORAL | 3 refills | Status: DC
  Filled 2022-10-07: qty 6, 28d supply, fill #0

## 2022-10-07 NOTE — Patient Instructions (Addendum)
Medication Instructions:  DECREASE Amiodarone to 100mg  three times weekly (M.W, F)  *If you need a refill on your cardiac medications before your next appointment, please call your pharmacy*   Lab Work: Lipids, ALT, BMET today If you have labs (blood work) drawn today and your tests are completely normal, you will receive your results only by: [place] (if you have MyChart) OR A paper copy in the mail If you have any lab test that is abnormal or we need to change your treatment, we will call you to review the results.   Testing/Procedures: NONE   Follow-Up: At [facility], you and your health needs are our priority.  As part of our continuing mission to provide you with exceptional heart care, we have created designated Provider Care Teams.  These Care Teams include your primary Cardiologist (physician) and Advanced Practice Providers (APPs -  Physician Assistants and Nurse Practitioners) who all work together to provide you with the care you need, when you need it.  We recommend signing up for the patient portal called "MyChart".  Sign up information is provided on this After Visit Summary.  MyChart is used to connect with patients for Virtual Visits (Telemedicine).  Patients are able to view lab/test results, encounter notes, upcoming appointments, etc.  Non-urgent messages can be sent to your provider as well.   To learn more about what you can do with MyChart, go to [email or web address].    Your next appointment:   1 year(s)  The format for your next appointment:   In Person  Provider:   Richardson Dopp, PA      Important Information About Sugar

## 2022-10-08 LAB — LIPID PANEL
Chol/HDL Ratio: 2 ratio (ref 0.0–5.0)
Cholesterol, Total: 127 mg/dL (ref 100–199)
HDL: 64 mg/dL (ref >39)
LDL Chol Calc (NIH): 46 mg/dL (ref 0–99)
Triglycerides: 93 mg/dL (ref 0–149)
VLDL Cholesterol Cal: 17 mg/dL (ref 5–40)

## 2022-10-08 LAB — BASIC METABOLIC PANEL
BUN/Creatinine Ratio: 16 (ref 10–24)
BUN: 16 mg/dL (ref 8–27)
CO2: 25 mmol/L (ref 20–29)
Calcium: 9.1 mg/dL (ref 8.6–10.2)
Chloride: 106 mmol/L (ref 96–106)
Creatinine, Ser: 1.02 mg/dL (ref 0.76–1.27)
Glucose: 114 mg/dL — ABNORMAL HIGH (ref 70–99)
Potassium: 4 mmol/L (ref 3.5–5.2)
Sodium: 142 mmol/L (ref 134–144)
eGFR: 74 mL/min/{1.73_m2} (ref >59)

## 2022-10-08 LAB — ALT: ALT: 16 IU/L (ref 0–44)

## 2022-10-14 ENCOUNTER — Ambulatory Visit: Payer: Medicare Other | Admitting: Neurology

## 2022-10-15 ENCOUNTER — Inpatient Hospital Stay: Admit: 2022-10-15 | Payer: Medicare Other | Admitting: Internal Medicine

## 2022-10-15 ENCOUNTER — Telehealth: Payer: Self-pay

## 2022-10-15 ENCOUNTER — Encounter (HOSPITAL_COMMUNITY): Payer: Self-pay

## 2022-10-15 DIAGNOSIS — D329 Benign neoplasm of meninges, unspecified: Secondary | ICD-10-CM | POA: Diagnosis not present

## 2022-10-15 DIAGNOSIS — R531 Weakness: Secondary | ICD-10-CM | POA: Diagnosis not present

## 2022-10-15 DIAGNOSIS — I639 Cerebral infarction, unspecified: Secondary | ICD-10-CM | POA: Diagnosis not present

## 2022-10-15 DIAGNOSIS — R079 Chest pain, unspecified: Secondary | ICD-10-CM | POA: Diagnosis not present

## 2022-10-15 DIAGNOSIS — G9389 Other specified disorders of brain: Secondary | ICD-10-CM | POA: Diagnosis not present

## 2022-10-15 DIAGNOSIS — R29898 Other symptoms and signs involving the musculoskeletal system: Secondary | ICD-10-CM | POA: Diagnosis not present

## 2022-10-15 DIAGNOSIS — J90 Pleural effusion, not elsewhere classified: Secondary | ICD-10-CM | POA: Diagnosis not present

## 2022-10-15 DIAGNOSIS — C3490 Malignant neoplasm of unspecified part of unspecified bronchus or lung: Secondary | ICD-10-CM | POA: Diagnosis not present

## 2022-10-15 DIAGNOSIS — Z91041 Radiographic dye allergy status: Secondary | ICD-10-CM | POA: Diagnosis not present

## 2022-10-15 DIAGNOSIS — G936 Cerebral edema: Secondary | ICD-10-CM | POA: Diagnosis not present

## 2022-10-15 DIAGNOSIS — R9431 Abnormal electrocardiogram [ECG] [EKG]: Secondary | ICD-10-CM | POA: Diagnosis not present

## 2022-10-15 DIAGNOSIS — R200 Anesthesia of skin: Secondary | ICD-10-CM | POA: Diagnosis not present

## 2022-10-15 DIAGNOSIS — R519 Headache, unspecified: Secondary | ICD-10-CM | POA: Diagnosis not present

## 2022-10-15 DIAGNOSIS — I4891 Unspecified atrial fibrillation: Secondary | ICD-10-CM | POA: Diagnosis not present

## 2022-10-15 NOTE — Telephone Encounter (Signed)
Nurse Assessment Nurse: Sheppard Plumber, RN, Estill Bamberg Date/Time (Eastern Time): 10/15/2022 8:06:24 AM Confirm and document reason for call. If symptomatic, describe symptoms. ---caller states his fingers on the left hand are going numb, left arm hurts. back was hurting this morning as well down toward the hip. no chest pain. Does the patient have any new or worsening symptoms? ---Yes Will a triage be completed? ---Yes Related visit to physician within the last 2 weeks? ---No Does the PT have any chronic conditions? (i.e. diabetes, asthma, this includes High risk factors for pregnancy, etc.) ---Yes List chronic conditions. ---HTN, open heart bypass Is this a behavioral health or substance abuse call? ---No Guidelines Guideline Title Affirmed Question Affirmed Notes Nurse Date/Time (Eastern Time) Arm Pain Sounds like a life-threatening emergency to the triager Humfleet, RN, Estill Bamberg 10/15/2022 8:08:52 AM Disp. Time Eilene Ghazi Time) Disposition Final User 10/15/2022 8:05:36 AM Send to Urgent Adolm Joseph, Brittney PLEASE NOTE: All timestamps contained within this report are represented as [place] Standard Time. CONFIDENTIALTY NOTICE: This fax transmission is intended only for the addressee. It contains information that is legally privileged, confidential or otherwise protected from use or disclosure. If you are not the intended recipient, you are strictly prohibited from reviewing, disclosing, copying using or disseminating any of this information or taking any action in reliance on or regarding this information. If you have received this fax in error, please notify us immediately by telephone so that we can arrange for its return to [place]. Phone: [phone], Toll-Free: [phone], Fax: [phone] Page: 2 of 2 Call Id: [id number] [place]. Time Eilene Ghazi Time) Disposition Final User 10/15/2022 8:12:12 AM Call EMS 911 Now Yes Humfleet, RN, Estill Bamberg 10/15/2022 8:23:10 AM 911 Outcome Documentation  Humfleet, RN, Estill Bamberg Reason: not calling 911 - driving Final Disposition 10/15/2022 8:12:12 AM Call EMS 911 Now Yes Humfleet, RN, Shelly Coss Disagree/Comply Comply Caller Understands Yes PreDisposition Did not know what to do Care Advice Given Per Guideline CALL EMS 911 NOW: * Immediate medical attention is needed. You need to hang up and call 911 (or an ambulance). CARE ADVICE given per Arm Pain (Adult) guideline. Referrals GO TO FACILITY UNDECIDED

## 2022-10-16 ENCOUNTER — Telehealth (HOSPITAL_BASED_OUTPATIENT_CLINIC_OR_DEPARTMENT_OTHER): Payer: Self-pay | Admitting: Emergency Medicine

## 2022-10-16 ENCOUNTER — Telehealth: Payer: Self-pay | Admitting: Neurology

## 2022-10-16 NOTE — Telephone Encounter (Signed)
I called pt. No answer, left a message asking pt to call me back.   

## 2022-10-16 NOTE — Telephone Encounter (Signed)
Pt is calling. Stated he wants to speak with nurse, said he is currently admitted and have question for nurse or doctor.

## 2022-10-17 ENCOUNTER — Telehealth: Payer: Self-pay | Admitting: Pharmacist

## 2022-10-17 ENCOUNTER — Other Ambulatory Visit (HOSPITAL_BASED_OUTPATIENT_CLINIC_OR_DEPARTMENT_OTHER): Payer: Self-pay

## 2022-10-17 DIAGNOSIS — R531 Weakness: Secondary | ICD-10-CM | POA: Diagnosis not present

## 2022-10-17 DIAGNOSIS — R29898 Other symptoms and signs involving the musculoskeletal system: Secondary | ICD-10-CM | POA: Diagnosis not present

## 2022-10-17 MED ORDER — DEXAMETHASONE 4 MG PO TABS
4.0000 mg | ORAL_TABLET | Freq: Four times a day (QID) | ORAL | 0 refills | Status: DC
  Filled 2022-10-17: qty 28, 7d supply, fill #0

## 2022-10-17 NOTE — Telephone Encounter (Addendum)
I called pt he wanted to know why he has not been transferred from [place] to [facility].  I advised I was not sure on this and he would have to talk the nursing staff and about transfer information.

## 2022-10-17 NOTE — Telephone Encounter (Signed)
Patient called to let PCP know that he was admitted to ED in [place], [place] after having what was suspected to be stroke like symptoms while on a job site. He was taken by EMS to [facility]. 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   Today patient is restless and doesn't understand why he can't go home to wait. I explained that because he I shaving symptoms related to the meningioma,  it is important that he continue under observation until his treating physicians feel he is safe to be at home.   Forwarding information to PCP.

## 2022-10-17 NOTE — Telephone Encounter (Signed)
Noted  

## 2022-10-17 NOTE — Telephone Encounter (Signed)
Please advise-I just got off the phone with Robert Bates calling from [facility] ED. Patient is now back to baseline and they are wanting to go ahead and discharge him home, but want him to have a f/u appt with Dr.Camara within the next week and do not want to d/c him unless he has this. Nothing currently available, is there somewhere we can work this patient in?  Thank you

## 2022-10-17 NOTE — Telephone Encounter (Signed)
Spoke with Dr. April Manson. Patient needs to f/u with neurosurgery urgently and see [place] for routine visit next month for a f/u. I called Alexis at [facility] ED and let her know of Dr. Jabier Mutton recommendation, she is going to call [facility] and see what they need her to send for a referral and get him in there urgently. Went ahead and scheduled patient for a f/u 12/03/22 with Dr. April Manson. Nothing further needed at this time.

## 2022-10-18 ENCOUNTER — Other Ambulatory Visit (HOSPITAL_COMMUNITY): Payer: Self-pay

## 2022-10-21 ENCOUNTER — Telehealth: Payer: Self-pay | Admitting: Cardiovascular Disease

## 2022-10-21 ENCOUNTER — Encounter: Payer: Self-pay | Admitting: Cardiovascular Disease

## 2022-10-21 DIAGNOSIS — D329 Benign neoplasm of meninges, unspecified: Secondary | ICD-10-CM | POA: Diagnosis not present

## 2022-10-21 NOTE — Telephone Encounter (Signed)
Per last OV note on 10/07/22:   1.  Paroxysmal atrial fibrillation:   is maintaining NSR .   Will reduce amio to 100 mg a day on M.W, F     2. Hyperlipidemia:   draw labs today , cont current meds.  Advised careful diet     3. Lung cancer:     4.  CAD :  s/p CABG ;  no angina    5.  Wheezing :  / pseudowheezing .     Attempted to call patient, but goes straight to voicemail and no vm has been set up yet for messages. Called wife Tye [place] on [place] at number on file and left message stating that the letter Dr Vevelyn Royals was requesting was sent via MyChart to him and he should call us back only if he needs [place] or has additional concerns/questions.  Letter sent via [place].

## 2022-10-21 NOTE — Telephone Encounter (Signed)
New Message:      Patient said he saw Dr Acie Fredrickson on 10-07-22. He says his job requires a note from his Cardiologist to return to work.

## 2022-11-02 ENCOUNTER — Other Ambulatory Visit: Payer: Self-pay | Admitting: Family Medicine

## 2022-11-02 ENCOUNTER — Other Ambulatory Visit: Payer: Self-pay

## 2022-11-02 DIAGNOSIS — I480 Paroxysmal atrial fibrillation: Secondary | ICD-10-CM

## 2022-11-04 ENCOUNTER — Other Ambulatory Visit: Payer: Self-pay

## 2022-11-05 ENCOUNTER — Other Ambulatory Visit: Payer: Self-pay | Admitting: Family

## 2022-11-05 ENCOUNTER — Ambulatory Visit (INDEPENDENT_AMBULATORY_CARE_PROVIDER_SITE_OTHER): Payer: Medicare Other | Admitting: Family

## 2022-11-05 ENCOUNTER — Other Ambulatory Visit (HOSPITAL_BASED_OUTPATIENT_CLINIC_OR_DEPARTMENT_OTHER): Payer: Self-pay

## 2022-11-05 ENCOUNTER — Telehealth: Payer: Self-pay | Admitting: Family

## 2022-11-05 ENCOUNTER — Ambulatory Visit (HOSPITAL_BASED_OUTPATIENT_CLINIC_OR_DEPARTMENT_OTHER)
Admission: RE | Admit: 2022-11-05 | Discharge: 2022-11-05 | Disposition: A | Discharge status: Home / Self Care | Payer: Medicare Other | Source: Ambulatory Visit | Attending: Family | Admitting: Family

## 2022-11-05 VITALS — BP 110/58 | HR 91 | Temp 98.0°F | Resp 16 | Wt 177.0 lb

## 2022-11-05 DIAGNOSIS — R339 Retention of urine, unspecified: Secondary | ICD-10-CM | POA: Insufficient documentation

## 2022-11-05 DIAGNOSIS — N210 Calculus in bladder: Secondary | ICD-10-CM | POA: Diagnosis not present

## 2022-11-05 DIAGNOSIS — N390 Urinary tract infection, site not specified: Secondary | ICD-10-CM

## 2022-11-05 DIAGNOSIS — H6123 Impacted cerumen, bilateral: Secondary | ICD-10-CM

## 2022-11-05 DIAGNOSIS — N281 Cyst of kidney, acquired: Secondary | ICD-10-CM | POA: Insufficient documentation

## 2022-11-05 DIAGNOSIS — D329 Benign neoplasm of meninges, unspecified: Secondary | ICD-10-CM

## 2022-11-05 LAB — POC URINALSYSI DIPSTICK (AUTOMATED)
Bilirubin, UA: NEGATIVE
Glucose, UA: NEGATIVE
Nitrite, UA: POSITIVE
Protein, UA: POSITIVE — AB
Spec Grav, UA: >=1.03 — AB (ref 1.010–1.025)
Urobilinogen, UA: 0.2 E.U./dL
pH, UA: 6 (ref 5.0–8.0)

## 2022-11-05 MED ORDER — NYSTATIN 100000 UNIT/ML MT SUSP
5.0000 mL | Freq: Four times a day (QID) | OROMUCOSAL | 0 refills | Status: AC
  Filled 2022-11-05: qty 140, 7d supply, fill #0

## 2022-11-05 MED ORDER — SULFAMETHOXAZOLE-TRIMETHOPRIM 800-160 MG PO TABS
1.0000 | ORAL_TABLET | Freq: Two times a day (BID) | ORAL | 0 refills | Status: DC
  Filled 2022-11-05: qty 14, 7d supply, fill #0

## 2022-11-05 MED ORDER — OMEPRAZOLE 40 MG PO CPDR
40.0000 mg | DELAYED_RELEASE_CAPSULE | Freq: Every day | ORAL | 1 refills | Status: DC
  Filled 2022-11-05: qty 90, 90d supply, fill #0
  Filled 2023-02-10: qty 90, 90d supply, fill #1

## 2022-11-05 NOTE — Assessment & Plan Note (Signed)
New. Reports good compliance with flomax. Will send down to [place] to measure post-void residual.  If high, will refer to ED.

## 2022-11-05 NOTE — Telephone Encounter (Signed)
Pt actually had an appointment today- no need to schedule him follow up.   Rod Holler, would you mind requesting last OV note from Dr. [name]? tks

## 2022-11-05 NOTE — Assessment & Plan Note (Addendum)
   Patient states that he has since followed up with his neurosurgeon, Dr. [name].  He is at his neurological baseline. Will request OV note from his office.

## 2022-11-05 NOTE — Progress Notes (Addendum)
Subjective:   By signing my name below, I, [name], attest that this documentation has been prepared under the direction and in the presence of [name], NP. 11/05/2022   Patient ID: Robert Bates, male    DOB: [birth year], 82 y.o.   MRN: [id number]  Chief Complaint  Patient presents with   Sore Throat    Complains of sore throat for one week   Mouth Lesions    Complains of tongue blisters   Urinary hesitancy    Straining to urinate    Sore Throat  Associated symptoms include coughing.  Mouth Lesions  Associated symptoms include mouth sores, sore throat, cough and wheezing.   Patient is in today for a office visit.   He complains of sore throat, blisters on tongue, and difficulty urinating due to pain for the past week. He also has nasal congestion, mild cough, wheezing. He denies having any fevers.   He also complains of his right ear popping when breathing in through his nostrils.  He seen an ED in [place] due to numbness in his left fingers and slurring speech. He presented to [facility] on 10/15/22.  Had MRI brain W and W/O contrast which noted the following:  1. 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  2. Additional meningiomas along the right aspect of the falx at the  vertex and along the planum sphenoidale are not significantly  changed since 2020. Mild edema in the right parasagittal frontal  lobe underlying the larger meningioma is unchanged.   It sounds like he was treated with steroids which he did not tolerate well but he is unable to further describe how it made him feel.    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   COPD (chronic obstructive pulmonary disease) (HCC)    Coronary artery disease    Diabetes type 2, controlled ([place]) 07/31/2017   Dyspnea    Dysrhythmia    a fib   GERD (gastroesophageal reflux disease)    Hematuria    refuses work up or referral - understands risks of morbidity / mortality - 11/2008, 12/2008   History of hiatal hernia    History of kidney stones    Hyperlipemia    Meningioma ([place]) 10/25/2013   Follows with Dr. [name].    Peripheral vascular disease ([place])    Abdominal Aortic Aneursym   Pneumonia    as a child   Radiation 09/18/15-10/25/15   left lower lobe 70.2 Gy   Seizures ([place]) 02/18/2020   Tobacco abuse     Past Surgical History:  Procedure Laterality Date   CHOLECYSTECTOMY N/A 07/23/2017   Procedure: LAPAROSCOPIC CHOLECYSTECTOMY;  Surgeon: Mickeal Skinner, MD;  Location: [facility];  Service: General;  Laterality: N/A;   CLIPPING OF ATRIAL APPENDAGE Left 05/08/2021   Procedure: CLIPPING OF ATRIAL APPENDAGE USING 60 ATRICLIP;  Surgeon: Wonda Olds, MD;  Location: [place];  Service: Open Heart Surgery;  Laterality: Left;   COLONOSCOPY     CORONARY ARTERY BYPASS GRAFT N/A 05/08/2021   Procedure: CORONARY ARTERY BYPASS GRAFTING (CABG)X 3 USING LEFT INTERNAL MAMMARY ARTERY AND RIGHT GREATER SAPEHNOUS VEIN;  Surgeon: Wonda Olds, MD;  Location: [place];  Service: Open Heart Surgery;  Laterality: N/A;   ENDOVEIN HARVEST OF GREATER SAPHENOUS VEIN Right 05/08/2021   Procedure: ENDOVEIN HARVEST OF GREATER SAPHENOUS VEIN;  Surgeon: Wonda Olds, MD;  Location: [place];  Service: Open Heart Surgery;  Laterality: Right;   EYE SURGERY Bilateral    Cataracts removed w/ lens implant   HERNIA REPAIR     Left 36 years ago . Right inguinal hernia repair  10-01-17 Dr. Kieth Brightly   INGUINAL HERNIA REPAIR Right 10/01/2017   Procedure: RIGHT INGUINAL HERNIA REPAIR WITH MESH;  Surgeon: Kinsinger, Arta Bruce, MD;  Location: [facility];  Service: General;  Laterality: Right;  TAP BLOCK   INSERTION OF MESH Right 10/01/2017   Procedure: INSERTION OF MESH;  Surgeon: Kieth Brightly Arta Bruce, MD;  Location: [facility];  Service: General;  Laterality: Right;   IR THORACENTESIS ASP PLEURAL SPACE W/IMG GUIDE  05/18/2021   IR THORACENTESIS ASP PLEURAL SPACE W/IMG GUIDE  06/07/2021   LEFT HEART CATH AND CORONARY ANGIOGRAPHY N/A 04/20/2021   Procedure: LEFT HEART CATH AND CORONARY ANGIOGRAPHY;  Surgeon: Wellington Hampshire, MD;  Location: [place] [facility];  Service: Cardiovascular;  Laterality: N/A;   TEE WITHOUT CARDIOVERSION N/A 05/08/2021   Procedure: TRANSESOPHAGEAL ECHOCARDIOGRAM (TEE);  Surgeon: Wonda Olds, MD;  Location: [place];  Service: Open Heart Surgery;  Laterality: N/A;   TONSILLECTOMY     TONSILLECTOMY     VIDEO BRONCHOSCOPY Bilateral 07/26/2015   Procedure: VIDEO BRONCHOSCOPY WITH FLUORO;  Surgeon: [name], MD;  [facility];  Service: Cardiopulmonary;  Laterality: Bilateral;   VIDEO BRONCHOSCOPY WITH ENDOBRONCHIAL NAVIGATION N/A 08/23/2015   Procedure: VIDEO BRONCHOSCOPY WITH ENDOBRONCHIAL NAVIGATION;  Surgeon: Grace Isaac, MD;  Location[facility];  Service: Thoracic;  Laterality: N/A;   VIDEO BRONCHOSCOPY WITH ENDOBRONCHIAL ULTRASOUND N/A 08/23/2015   Procedure: VIDEO BRONCHOSCOPY WITH ENDOBRONCHIAL ULTRASOUND;  Surgeon: Grace Isaac, MD;  Location: [facility];  Service: Thoracic;  Laterality: N/A;    Family History  Problem Relation Age of Onset   Leukemia Father    Emphysema Father    Learning disabilities Son    Atrial fibrillation Son    Leukemia Other    Stroke Other     Social History   Socioeconomic History   Marital status: Married    Spouse name: Not on file   Number of children: 2   Years of education: Not on  file   Highest education level: Not on file  Occupational History   Occupation: Retired    Fish farm manager: [employer]    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[employer]  Tobacco Use   Smoking status: Former    Packs/day: 1.00    Years: 57.00    Total pack years: 57.00    Types: Cigarettes, Cigars    Quit date: 08/08/2015    Years since quitting: 7.2   Smokeless tobacco: Former    Types: Chew    Quit date: 11/04/1958   Tobacco comments:    Will smoke cigar every once in awhile.  Vaping daily started a couple weeks ago.  04/18/22 hfb  Vaping Use   Vaping Use: Former  Substance and Sexual Activity   Alcohol  use: Not Currently    Alcohol/week: 0.0 standard drinks of alcohol   Drug use: No   Sexual activity: Not Currently  Other Topics Concern   Not on file  Social History Narrative   Not on file   Social Determinants of Health   Financial Resource Strain: Medium Risk (11/22/2021)   Overall Financial Resource Strain (CARDIA)    Difficulty of Paying Living Expenses: Somewhat hard  Food Insecurity: No Food Insecurity (10/02/2022)   Hunger Vital Sign    Worried About Running Out of Food in the Last Year: Never true    Ran Out of Food in the Last Year: Never true  Transportation Needs: No Transportation Needs (05/13/2022)   PRAPARE - Hydrologist (Medical): No    Lack of Transportation (Non-Medical): No  Physical Activity: Insufficiently Active (08/05/2021)   Exercise Vital Sign    Days of Exercise per Week: 2 days    Minutes of Exercise per Session: 40 min  Stress: No Stress Concern Present (10/01/2021)   [place]    Feeling of Stress : Not at all  Social Connections: Moderately Isolated (05/13/2022)   Social Connection and Isolation Panel [NHANES]    Frequency of Communication with Friends and Family: Twice a week    Frequency of Social Gatherings with Friends and Family: Once a week     Attends Religious Services: Never    Marine scientist or Organizations: No    Attends Archivist Meetings: Never    Marital Status: Married  Human resources officer Violence: Not At Risk (10/02/2022)   Humiliation, Afraid, Rape, and Kick questionnaire    Fear of Current or Ex-Partner: No    Emotionally Abused: No    Physically Abused: No    Sexually Abused: No    Outpatient Medications Prior to Visit  Medication Sig Dispense Refill   acetaminophen (TYLENOL) 500 MG tablet Take 1-2 tablets (500-1,000 mg total) by mouth every 6 (six) hours as needed. 30 tablet 0   albuterol (PROVENTIL) (2.5 MG/3ML) 0.083% nebulizer solution Take 3 mLs (2.5 mg total) by nebulization every 6 (six) hours as needed for wheezing or shortness of breath. 75 mL 12   albuterol (VENTOLIN HFA) 108 (90 Base) MCG/ACT inhaler Inhale 2 puffs into the lungs every 6 (six) hours as needed for wheezing or shortness of breath. 8.5 g 5   amiodarone (PACERONE) 200 MG tablet Take 1/2 tablet (100mg ) by mouth three days per week (M,W,F) 6 tablet 3   atorvastatin (LIPITOR) 80 MG tablet Take 1 tablet (80 mg total) by mouth daily. 90 tablet 3   benzonatate (TESSALON) 100 MG capsule Take 1 capsule (100 mg total) by mouth 3 (three) times daily as needed for cough. 30 capsule 0   Budeson-Glycopyrrol-Formoterol (BREZTRI AEROSPHERE) 160-9-4.8 MCG/ACT AERO Inhale 2 puffs into the lungs in the morning and at bedtime. 10.7 g 5   dexamethasone (DECADRON) 4 MG tablet Take 1 tablet (4 mg total) by mouth 4 (four) times daily for 7 days. 28 tablet 0   fluticasone (FLONASE) 50 MCG/ACT nasal spray Place 2 sprays into both nostrils daily. 16 g 1   furosemide (LASIX) 40 MG tablet Take 1 tablet (40 mg total) by mouth daily. 90 tablet 0   gabapentin (NEURONTIN) 300 MG capsule Take 1 capsule (300 mg total) by mouth 3 (three) times daily. 90 capsule 1   levETIRAcetam (KEPPRA XR) 750 MG 24 hr tablet Take  1 tablet (750 mg total) by mouth at bedtime.  30 tablet 11   Multiple Vitamin (MULTIVITAMIN WITH MINERALS) TABS tablet Take 1 tablet by mouth daily in the afternoon.     potassium chloride (KLOR-CON) 10 MEQ tablet TAKE 2 TABLETS BY MOUTH DAILY FOR 2 DAYS THEN REDUCE TO 1 TABLET BY MOUTH DAILY (Patient taking differently: 10 mEq daily.) 92 tablet 3   tamsulosin (FLOMAX) 0.4 MG CAPS capsule Take 1 capsule (0.4 mg total) by mouth daily. 90 capsule 1   XARELTO 20 MG TABS tablet TAKE 1 TABLET BY MOUTH EVERY DAY WITH SUPPER 90 tablet 0   omeprazole (PRILOSEC) 40 MG capsule Take 1 capsule (40 mg total) by mouth daily. 90 capsule 1   No facility-administered medications prior to visit.    Allergies  Allergen Reactions   Iodine Other (See Comments)    neck swells   Iohexol Swelling    Neck and gland swelling per patient.    Review of Systems  HENT:  Positive for mouth sores and sore throat.        (+)blisters on tongue (+)right ear popping when breathing in through nostrils  Respiratory:  Positive for cough and wheezing.   Genitourinary:        (+)pain while urinating       Objective:    Physical Exam Constitutional:      General: He is not in acute distress.    Appearance: Normal appearance. He is not ill-appearing.  HENT:     Head: Normocephalic and atraumatic.     Right Ear: External ear normal.     Left Ear: External ear normal.     Mouth/Throat:     Comments: Oral thrush noted Eyes:     Extraocular Movements: Extraocular movements intact.     Pupils: Pupils are equal, round, and reactive to light.  Cardiovascular:     Rate and Rhythm: Normal rate and regular rhythm.     Heart sounds: Normal heart sounds. No murmur heard.    No gallop.  Pulmonary:     Effort: Pulmonary effort is normal. No respiratory distress.     Breath sounds: Wheezing (bilateral expiratory wheeze) present. No rales.  Skin:    General: Skin is warm and dry.  Neurological:     Mental Status: He is alert and oriented to person, place, and time.   Psychiatric:        Judgment: Judgment normal.     BP (!) 110/58 (BP Location: Right Arm, Patient Position: Sitting, Cuff Size: Small)   Pulse 91   Temp 98 F (36.7 C) (Oral)   Resp 16   Wt 177 lb (80.3 kg)   SpO2 99%   BMI 27.72 kg/m  Wt Readings from Last 3 Encounters:  11/05/22 177 lb (80.3 kg)  10/07/22 189 lb 6.4 oz (85.9 kg)  09/04/22 187 lb (84.8 kg)       Assessment & Plan:  Bilateral impacted cerumen Assessment & Plan: Ceruminosis is noted.  After verbal consent was obtained, wax is removed by syringing by CMA and syringing and and manual debridement by provider. Instructions for home care to prevent wax buildup are given.    Meningioma [facility]) Assessment & Plan:   Patient states that he has since followed up with his neurosurgeon, Dr. [name].  He is at his neurological baseline. Will request OV note from his office.    Urinary tract infection without hematuria, site unspecified -     Basic metabolic panel -  CBC with Differential/Platelet -     POCT Urinalysis Dipstick (Automated) -     Urine Culture  Urinary retention Assessment & Plan: New. Reports good compliance with flomax. Will send down to [place] to measure post-void residual.  If high, will refer to ED.     Other orders -     Sulfamethoxazole-Trimethoprim; Take 1 tablet by mouth 2 (two) times daily.  Dispense: 14 tablet; Refill: 0 -     Nystatin; Take 5 mLs (500,000 Units total) by mouth 4 (four) times daily for 7 days.  Dispense: 140 mL; Refill: 0    I, Nance Pear, NP, personally preformed the services described in this documentation.  All medical record entries made by the scribe were at my direction and in my presence.  I have reviewed the chart and discharge instructions (if applicable) and agree that the record reflects my personal performance and is accurate and complete. 11/05/2022  >40 minutes spent on today's visit. Time was spent interviewing patient, reviewing outside  records, communicating PVR orders/plan with [place] department, and cleaning his ears.  I,Shehryar Multimedia programmer as a Education administrator for [employer], NP.,have documented all relevant documentation on the behalf of Nance Pear, NP,as directed by  Nance Pear, NP while in the presence of Nance Pear, NP.   Nance Pear, NP

## 2022-11-05 NOTE — Telephone Encounter (Signed)
Records release faxed 

## 2022-11-05 NOTE — Assessment & Plan Note (Addendum)
Ceruminosis is noted.  After verbal consent was obtained, wax is removed by syringing by CMA and syringing and and manual debridement by provider. Instructions for home care to prevent wax buildup are given.

## 2022-11-05 NOTE — Telephone Encounter (Signed)
Please contact pt to schedule a follow up appointment with me.  

## 2022-11-06 LAB — CBC WITH DIFFERENTIAL/PLATELET
Basophils Absolute: 0.1 10*3/uL (ref 0.0–0.1)
Basophils Relative: 0.5 % (ref 0.0–3.0)
Eosinophils Absolute: 0.1 10*3/uL (ref 0.0–0.7)
Eosinophils Relative: 0.6 % (ref 0.0–5.0)
HCT: 39.8 % (ref 39.0–52.0)
Hemoglobin: 12.9 g/dL — ABNORMAL LOW (ref 13.0–17.0)
Lymphocytes Relative: 6.5 % — ABNORMAL LOW (ref 12.0–46.0)
Lymphs Abs: 0.7 10*3/uL (ref 0.7–4.0)
MCHC: 32.4 g/dL (ref 30.0–36.0)
MCV: 86.1 fl (ref 78.0–100.0)
Monocytes Absolute: 1.3 10*3/uL — ABNORMAL HIGH (ref 0.1–1.0)
Monocytes Relative: 11.4 % (ref 3.0–12.0)
Neutro Abs: 9.1 10*3/uL — ABNORMAL HIGH (ref 1.4–7.7)
Neutrophils Relative %: 81 % — ABNORMAL HIGH (ref 43.0–77.0)
Platelets: 172 10*3/uL (ref 150.0–400.0)
RBC: 4.63 Mil/uL (ref 4.22–5.81)
RDW: 17.6 % — ABNORMAL HIGH (ref 11.5–15.5)
WBC: 11.2 10*3/uL — ABNORMAL HIGH (ref 4.0–10.5)

## 2022-11-06 LAB — URINE CULTURE
MICRO NUMBER:: 14377186
Result:: NO GROWTH
SPECIMEN QUALITY:: ADEQUATE

## 2022-11-06 LAB — BASIC METABOLIC PANEL
BUN: 18 mg/dL (ref 6–23)
CO2: 25 mEq/L (ref 19–32)
Calcium: 8.4 mg/dL (ref 8.4–10.5)
Chloride: 101 mEq/L (ref 96–112)
Creatinine, Ser: 0.85 mg/dL (ref 0.40–1.50)
GFR: 81.57 mL/min (ref >60.00)
Glucose, Bld: 133 mg/dL — ABNORMAL HIGH (ref 70–99)
Potassium: 3.9 mEq/L (ref 3.5–5.1)
Sodium: 138 mEq/L (ref 135–145)

## 2022-11-08 ENCOUNTER — Telehealth: Payer: Self-pay | Admitting: Family

## 2022-11-08 NOTE — Telephone Encounter (Signed)
Talked to patient earlier about results

## 2022-11-08 NOTE — Telephone Encounter (Signed)
Patient returned call to discuss labs

## 2022-11-13 ENCOUNTER — Telehealth: Payer: Self-pay | Admitting: Family

## 2022-11-13 NOTE — Telephone Encounter (Signed)
Pt called asking if he still needed to keep his 3 month follow up with Melissa due to him coming in recently. Please Advise.

## 2022-11-14 NOTE — Telephone Encounter (Signed)
Patient advised to keep tomorrow's appointment

## 2022-11-14 NOTE — Telephone Encounter (Signed)
Yes please

## 2022-11-15 ENCOUNTER — Ambulatory Visit (INDEPENDENT_AMBULATORY_CARE_PROVIDER_SITE_OTHER): Payer: Medicare Other | Admitting: Family

## 2022-11-15 ENCOUNTER — Encounter: Payer: Self-pay | Admitting: Family

## 2022-11-15 VITALS — BP 121/74 | HR 79 | Temp 97.6°F | Resp 16 | Wt 178.0 lb

## 2022-11-15 DIAGNOSIS — C3490 Malignant neoplasm of unspecified part of unspecified bronchus or lung: Secondary | ICD-10-CM | POA: Diagnosis not present

## 2022-11-15 DIAGNOSIS — R339 Retention of urine, unspecified: Secondary | ICD-10-CM

## 2022-11-15 DIAGNOSIS — B351 Tinea unguium: Secondary | ICD-10-CM

## 2022-11-15 DIAGNOSIS — J449 Chronic obstructive pulmonary disease, unspecified: Secondary | ICD-10-CM

## 2022-11-15 DIAGNOSIS — R569 Unspecified convulsions: Secondary | ICD-10-CM

## 2022-11-15 DIAGNOSIS — I480 Paroxysmal atrial fibrillation: Secondary | ICD-10-CM | POA: Diagnosis not present

## 2022-11-15 DIAGNOSIS — D72829 Elevated white blood cell count, unspecified: Secondary | ICD-10-CM

## 2022-11-15 DIAGNOSIS — D329 Benign neoplasm of meninges, unspecified: Secondary | ICD-10-CM | POA: Diagnosis not present

## 2022-11-15 DIAGNOSIS — E119 Type 2 diabetes mellitus without complications: Secondary | ICD-10-CM

## 2022-11-15 NOTE — Progress Notes (Signed)
Subjective:     Patient ID: Robert Bates, male    DOB: 06-13-1941, 82 y.o.   MRN: [id number]  Chief Complaint  Patient presents with   Hypertension    Here for follow up    Hypertension   Patient is in today for routine follow up.  HTN- he is maintained on lasix 40mg  once daily. BP Readings from Last 3 Encounters:  11/15/22 121/74  11/05/22 (!) 110/58  10/07/22 122/70   BPH- on flomax.  Urinating without difficulty.  Urine culture was negative last visit.   GERD- maintained on omeprazole.   Hx of seizure disorder-  maintained on keppra xr, no recent seizure activity.  Hx of meningioma- denies episodes of confusion since he was released from the hospital.   Hyperlipidemia- maintained on atorvastatin.  Lab Results  Component Value Date   CHOL 127 10/07/2022   HDL 64 10/07/2022   LDLCALC 46 10/07/2022   LDLDIRECT 142.2 12/26/2008   TRIG 93 10/07/2022   CHOLHDL 2.0 10/07/2022   AF- on xarelto and amiodarone. Followed by cardiology.    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   COPD (chronic obstructive pulmonary disease) (HCC)    Coronary artery disease    Diabetes type 2, controlled (HCC) 07/31/2017   Dyspnea    Dysrhythmia    a fib   GERD (gastroesophageal reflux disease)    Hematuria    refuses work up or referral - understands risks of morbidity / mortality - 11/2008, 12/2008   History of hiatal hernia    History of kidney stones    Hyperlipemia     Meningioma (HCC) 10/25/2013   Follows with Dr. 10/27/2013.    Peripheral vascular disease (HCC)    Abdominal Aortic Aneursym   Pneumonia    as a child   Radiation 09/18/15-10/25/15   left lower lobe 70.2 Gy   Seizures (HCC) 02/18/2020   Tobacco abuse     Past Surgical History:  Procedure Laterality Date   CHOLECYSTECTOMY N/A 07/23/2017   Procedure: LAPAROSCOPIC CHOLECYSTECTOMY;  Surgeon: 07/25/2017, MD;  Location: [facility];  Service: General;  Laterality: N/A;   CLIPPING OF ATRIAL APPENDAGE Left 05/08/2021   Procedure: CLIPPING OF ATRIAL APPENDAGE USING 45 ATRICLIP;  Surgeon: 07/09/2021, MD;  Location: [facility];  Service: Open Heart Surgery;  Laterality: Left;   COLONOSCOPY     CORONARY ARTERY BYPASS GRAFT N/A 05/08/2021   Procedure: CORONARY ARTERY BYPASS GRAFTING (CABG)X 3 USING LEFT INTERNAL MAMMARY ARTERY AND RIGHT GREATER SAPEHNOUS VEIN;  Surgeon: 07/09/2021, MD;  Location: [facility];  Service: Open Heart Surgery;  Laterality: N/A;   ENDOVEIN HARVEST OF GREATER SAPHENOUS VEIN Right 05/08/2021   Procedure: ENDOVEIN HARVEST OF GREATER SAPHENOUS VEIN;  Surgeon: 07/09/2021, MD;  Location: [facility];  Service: Open Heart Surgery;  Laterality: Right;   EYE SURGERY Bilateral    Cataracts removed w/ lens  implant   HERNIA REPAIR     Left 36 years ago . Right inguinal hernia repair 10-01-17 Dr. Sheliah Hatch   INGUINAL HERNIA REPAIR Right 10/01/2017   Procedure: RIGHT INGUINAL HERNIA REPAIR WITH MESH;  Surgeon: Kinsinger, De Blanch, MD;  Location: [facility];  Service: General;  Laterality: Right;  TAP BLOCK   INSERTION OF MESH Right 10/01/2017   Procedure: INSERTION OF MESH;  Surgeon: Sheliah Hatch De Blanch, MD;  Location: [facility];  Service: General;  Laterality: Right;   IR THORACENTESIS ASP PLEURAL SPACE W/IMG GUIDE  05/18/2021   IR THORACENTESIS ASP PLEURAL SPACE W/IMG GUIDE  06/07/2021   LEFT HEART CATH AND CORONARY ANGIOGRAPHY N/A 04/20/2021   Procedure: LEFT HEART CATH AND  CORONARY ANGIOGRAPHY;  Surgeon: Iran Ouch, MD;  [facility];  Service: Cardiovascular;  Laterality: N/A;   TEE WITHOUT CARDIOVERSION N/A 05/08/2021   Procedure: TRANSESOPHAGEAL ECHOCARDIOGRAM (TEE);  Surgeon: Linden Dolin, MD;  Location: [facility] OR;  Service: Open Heart Surgery;  Laterality: N/A;   TONSILLECTOMY     TONSILLECTOMY     VIDEO BRONCHOSCOPY Bilateral 07/26/2015   Procedure: VIDEO BRONCHOSCOPY WITH FLUORO;  Surgeon: Nyoka Cowden, MD;  [facility]: [facility];  Service: Cardiopulmonary;  Laterality: Bilateral;   VIDEO BRONCHOSCOPY WITH ENDOBRONCHIAL NAVIGATION N/A 08/23/2015   Procedure: VIDEO BRONCHOSCOPY WITH ENDOBRONCHIAL NAVIGATION;  Surgeon: Delight Ovens, MD;  [facility];  Service: Thoracic;  Laterality: N/A;   VIDEO BRONCHOSCOPY WITH ENDOBRONCHIAL ULTRASOUND N/A 08/23/2015   Procedure: VIDEO BRONCHOSCOPY WITH ENDOBRONCHIAL ULTRASOUND;  Surgeon: Delight Ovens, MD;  Location: [facility];  Service: Thoracic;  Laterality: N/A;    Family History  Problem Relation Age of Onset   Leukemia Father    Emphysema Father    Learning disabilities Son    Atrial fibrillation Son    Leukemia Other    Stroke Other     Social History   Socioeconomic History   Marital status: Married    Spouse name: Not on file   Number of children: 2   Years of education: Not on file   Highest education level: Not on file  Occupational History   Occupation: Retired    Associate Professor: [employer]    Comment: truck Air traffic controller: [employer]  Tobacco Use   Smoking status: Former    Packs/day: 1.00    Years: 57.00    Total pack years: 57.00    Types: Cigarettes, Cigars    Quit date: 08/08/2015    Years since quitting: 7.2   Smokeless tobacco: Former    Types: Chew    Quit date: 11/04/1958   Tobacco comments:    Will smoke cigar every once in awhile.  Vaping daily started a couple weeks ago.  04/18/22 hfb  Vaping Use   Vaping Use: Former  Substance and Sexual  Activity   Alcohol use: Not Currently    Alcohol/week: 0.0 standard drinks of alcohol   Drug use: No   Sexual activity: Not Currently  Other Topics Concern   Not on file  Social History Narrative   Not on file   Social Determinants of Health   Financial Resource Strain: Medium Risk (11/22/2021)   Overall Financial Resource Strain (CARDIA)    Difficulty of Paying Living Expenses: Somewhat hard  Food Insecurity: No Food Insecurity (10/02/2022)   Hunger Vital Sign    Worried About Running Out of Food in the Last Year: Never true    Ran Out of Food  in the Last Year: Never true  Transportation Needs: No Transportation Needs (05/13/2022)   PRAPARE - Hydrologist (Medical): No    Lack of Transportation (Non-Medical): No  Physical Activity: Insufficiently Active (08/05/2021)   Exercise Vital Sign    Days of Exercise per Week: 2 days    Minutes of Exercise per Session: 40 min  Stress: No Stress Concern Present (10/01/2021)   [place]    Feeling of Stress : Not at all  Social Connections: Moderately Isolated (05/13/2022)   Social Connection and Isolation Panel [NHANES]    Frequency of Communication with Friends and Family: Twice a week    Frequency of Social Gatherings with Friends and Family: Once a week    Attends Religious Services: Never    Marine scientist or Organizations: No    Attends Archivist Meetings: Never    Marital Status: Married  Human resources officer Violence: Not At Risk (10/02/2022)   Humiliation, Afraid, Rape, and Kick questionnaire    Fear of Current or Ex-Partner: No    Emotionally Abused: No    Physically Abused: No    Sexually Abused: No    Outpatient Medications Prior to Visit  Medication Sig Dispense Refill   acetaminophen (TYLENOL) 500 MG tablet Take 1-2 tablets (500-1,000 mg total) by mouth every 6 (six) hours as needed. 30 tablet 0   albuterol  (PROVENTIL) (2.5 MG/3ML) 0.083% nebulizer solution Take 3 mLs (2.5 mg total) by nebulization every 6 (six) hours as needed for wheezing or shortness of breath. 75 mL 12   albuterol (VENTOLIN HFA) 108 (90 Base) MCG/ACT inhaler Inhale 2 puffs into the lungs every 6 (six) hours as needed for wheezing or shortness of breath. 8.5 g 5   amiodarone (PACERONE) 200 MG tablet Take 1/2 tablet (100mg ) by mouth three days per week (M,W,F) 6 tablet 3   atorvastatin (LIPITOR) 80 MG tablet Take 1 tablet (80 mg total) by mouth daily. 90 tablet 3   Budeson-Glycopyrrol-Formoterol (BREZTRI AEROSPHERE) 160-9-4.8 MCG/ACT AERO Inhale 2 puffs into the lungs in the morning and at bedtime. 10.7 g 5   fluticasone (FLONASE) 50 MCG/ACT nasal spray Place 2 sprays into both nostrils daily. 16 g 1   furosemide (LASIX) 40 MG tablet Take 1 tablet (40 mg total) by mouth daily. 90 tablet 0   gabapentin (NEURONTIN) 300 MG capsule Take 1 capsule (300 mg total) by mouth 3 (three) times daily. 90 capsule 1   levETIRAcetam (KEPPRA XR) 750 MG 24 hr tablet Take 1 tablet (750 mg total) by mouth at bedtime. 30 tablet 11   Multiple Vitamin (MULTIVITAMIN WITH MINERALS) TABS tablet Take 1 tablet by mouth daily in the afternoon.     omeprazole (PRILOSEC) 40 MG capsule Take 1 capsule (40 mg total) by mouth daily. 90 capsule 1   potassium chloride (KLOR-CON) 10 MEQ tablet TAKE 2 TABLETS BY MOUTH DAILY FOR 2 DAYS THEN REDUCE TO 1 TABLET BY MOUTH DAILY (Patient taking differently: 10 mEq daily.) 92 tablet 3   tamsulosin (FLOMAX) 0.4 MG CAPS capsule Take 1 capsule (0.4 mg total) by mouth daily. 90 capsule 1   XARELTO 20 MG TABS tablet TAKE 1 TABLET BY MOUTH EVERY DAY WITH SUPPER 90 tablet 0   benzonatate (TESSALON) 100 MG capsule Take 1 capsule (100 mg total) by mouth 3 (three) times daily as needed for cough. 30 capsule 0   dexamethasone (DECADRON) 4 MG tablet  Take 1 tablet (4 mg total) by mouth 4 (four) times daily for 7 days. 28 tablet 0    sulfamethoxazole-trimethoprim (BACTRIM DS) 800-160 MG tablet Take 1 tablet by mouth 2 (two) times daily. 14 tablet 0   No facility-administered medications prior to visit.    Allergies  Allergen Reactions   Iodine Other (See Comments)    neck swells   Iohexol Swelling    Neck and gland swelling per patient.    ROS    See HPI  Objective:    Physical Exam Constitutional:      General: He is not in acute distress.    Appearance: He is well-developed.  HENT:     Head: Normocephalic and atraumatic.  Cardiovascular:     Rate and Rhythm: Normal rate and regular rhythm.     Heart sounds: No murmur heard. Pulmonary:     Effort: Pulmonary effort is normal. No respiratory distress.     Breath sounds: Normal breath sounds. No wheezing or rales.  Skin:    General: Skin is warm and dry.  Neurological:     Mental Status: He is alert and oriented to person, place, and time.  Psychiatric:        Behavior: Behavior normal.        Thought Content: Thought content normal.     BP 121/74 (BP Location: Right Arm, Patient Position: Sitting, Cuff Size: Small)   Pulse 79   Temp 97.6 F (36.4 C) (Oral)   Resp 16   Wt 178 lb (80.7 kg)   SpO2 95%   BMI 27.88 kg/m  Wt Readings from Last 3 Encounters:  11/15/22 178 lb (80.7 kg)  11/05/22 177 lb (80.3 kg)  10/07/22 189 lb 6.4 oz (85.9 kg)       Assessment & Plan:   Problem List Items Addressed This Visit       Unprioritized   PAF (paroxysmal atrial fibrillation) (HCC) (Chronic)    Rate stable. Followed by Dr. Elease Hashimoto.       Urinary retention    Denies any recent urinary tract symptoms or difficulty breathing. Urine culture was negative.       Seizures (HCC)    Maintained on Keppra. Continue same. No recent seizure activity.       Meningioma (HCC) - Primary    Remains asymptomatic- followed by Dr. Franky Macho.       Lung cancer [facility])    Curative radiotherapy to the left upper lobe lung mass under the care of Dr. Roselind Messier  completed 10/25/2015. Followed by oncology for surveillance.        Leukocytosis    Noted last visit. Repeat cbc.       Diabetes type 2, controlled (HCC)    Lab Results  Component Value Date   HGBA1C 6.4 04/19/2022  Discussed diabetic diet.        Relevant Orders   Ambulatory referral to Ophthalmology   HgB A1c   CBC w/Diff   COPD GOLD II     Clinically stable. He is followed by Dr. Thora Lance Pulmonology.       Other Visit Diagnoses     Onychomycosis       Relevant Orders   Ambulatory referral to Podiatry       I have discontinued Robert Bates's benzonatate, dexamethasone, and sulfamethoxazole-trimethoprim. I am also having him maintain his multivitamin with minerals, acetaminophen, albuterol, Breztri Aerosphere, albuterol, atorvastatin, potassium chloride, levETIRAcetam, tamsulosin, fluticasone, gabapentin, furosemide, amiodarone, Xarelto, and omeprazole.  No orders of  the defined types were placed in this encounter.

## 2022-11-15 NOTE — Assessment & Plan Note (Signed)
Denies any recent urinary tract symptoms or difficulty breathing. Urine culture was negative.

## 2022-11-15 NOTE — Assessment & Plan Note (Signed)
Clinically stable. He is followed by Dr. Thora Lance Pulmonology.

## 2022-11-15 NOTE — Assessment & Plan Note (Signed)
Noted last visit. Repeat cbc.

## 2022-11-15 NOTE — Assessment & Plan Note (Signed)
Rate stable. Followed by Dr. Elease Hashimoto.

## 2022-11-15 NOTE — Assessment & Plan Note (Signed)
Lab Results  Component Value Date   HGBA1C 6.4 04/19/2022   Discussed diabetic diet.

## 2022-11-15 NOTE — Assessment & Plan Note (Signed)
Remains asymptomatic- followed by Dr. Franky Macho.

## 2022-11-15 NOTE — Assessment & Plan Note (Signed)
Maintained on Keppra. Continue same. No recent seizure activity.

## 2022-11-15 NOTE — Assessment & Plan Note (Signed)
Curative radiotherapy to the left upper lobe lung mass under the care of Dr. Roselind Messier completed 10/25/2015. Followed by oncology for surveillance.

## 2022-11-15 NOTE — Assessment & Plan Note (Signed)
>>  ASSESSMENT AND PLAN FOR ATRIAL FIBRILLATION (HCC) WRITTEN ON 11/15/2022  2:20 PM BY O'SULLIVAN, Merleen Picazo, NP  Rate stable. Followed by Dr. Elease Hashimoto.

## 2022-11-16 LAB — CBC WITH DIFFERENTIAL/PLATELET
Absolute Monocytes: 877 cells/uL (ref 200–950)
Basophils Absolute: 41 cells/uL (ref 0–200)
Basophils Relative: 0.6 %
Eosinophils Absolute: 82 cells/uL (ref 15–500)
Eosinophils Relative: 1.2 %
HCT: 37.3 % — ABNORMAL LOW (ref 38.5–50.0)
Hemoglobin: 12 g/dL — ABNORMAL LOW (ref 13.2–17.1)
Lymphs Abs: 1122 cells/uL (ref 850–3900)
MCH: 28 pg (ref 27.0–33.0)
MCHC: 32.2 g/dL (ref 32.0–36.0)
MCV: 87.1 fL (ref 80.0–100.0)
MPV: 9.4 fL (ref 7.5–12.5)
Monocytes Relative: 12.9 %
Neutro Abs: 4678 cells/uL (ref 1500–7800)
Neutrophils Relative %: 68.8 %
Platelets: 504 10*3/uL — ABNORMAL HIGH (ref 140–400)
RBC: 4.28 10*6/uL (ref 4.20–5.80)
RDW: 15.8 % — ABNORMAL HIGH (ref 11.0–15.0)
Total Lymphocyte: 16.5 %
WBC: 6.8 10*3/uL (ref 3.8–10.8)

## 2022-11-16 LAB — HEMOGLOBIN A1C
Hgb A1c MFr Bld: 7.4 % of total Hgb — ABNORMAL HIGH (ref <5.7)
Mean Plasma Glucose: 166 mg/dL
eAG (mmol/L): 9.2 mmol/L

## 2022-11-17 ENCOUNTER — Telehealth: Payer: Self-pay | Admitting: Family

## 2022-11-17 DIAGNOSIS — D649 Anemia, unspecified: Secondary | ICD-10-CM

## 2022-11-17 MED ORDER — METFORMIN HCL 500 MG PO TABS
500.0000 mg | ORAL_TABLET | Freq: Two times a day (BID) | ORAL | 1 refills | Status: DC
  Filled 2022-11-17: qty 180, 90d supply, fill #0

## 2022-11-17 NOTE — Telephone Encounter (Signed)
Please advise pt that he is a little anemic.  I would like him to complete an IFOB.    Also, sugar has risen some.  I recommend that he restart metformin 500mg  bid.

## 2022-11-18 ENCOUNTER — Other Ambulatory Visit (HOSPITAL_BASED_OUTPATIENT_CLINIC_OR_DEPARTMENT_OTHER): Payer: Self-pay

## 2022-11-18 ENCOUNTER — Other Ambulatory Visit: Payer: Self-pay | Admitting: Family Medicine

## 2022-11-18 ENCOUNTER — Other Ambulatory Visit: Payer: Self-pay

## 2022-11-18 DIAGNOSIS — I480 Paroxysmal atrial fibrillation: Secondary | ICD-10-CM

## 2022-11-18 NOTE — Telephone Encounter (Signed)
Patient returned call. Please call back when available.

## 2022-11-18 NOTE — Telephone Encounter (Signed)
Left message on machine to call back  

## 2022-11-19 ENCOUNTER — Other Ambulatory Visit: Payer: Self-pay

## 2022-11-19 NOTE — Telephone Encounter (Signed)
-----  Message from Kathi Simpers, CMA sent at 11/18/2022  1:51 PM EST ----- Unfortunately that cannot be added. It will require a SST tube which was not needed for most recent lab order.

## 2022-11-19 NOTE — Telephone Encounter (Signed)
Called but no answer, lvm for him to call back

## 2022-11-20 ENCOUNTER — Other Ambulatory Visit (INDEPENDENT_AMBULATORY_CARE_PROVIDER_SITE_OTHER): Payer: Medicare Other

## 2022-11-20 ENCOUNTER — Other Ambulatory Visit: Payer: Self-pay | Admitting: Cardiovascular Disease

## 2022-11-20 ENCOUNTER — Other Ambulatory Visit: Payer: Medicare Other

## 2022-11-20 ENCOUNTER — Other Ambulatory Visit (HOSPITAL_BASED_OUTPATIENT_CLINIC_OR_DEPARTMENT_OTHER): Payer: Self-pay

## 2022-11-20 ENCOUNTER — Telehealth: Payer: Self-pay | Admitting: Cardiovascular Disease

## 2022-11-20 DIAGNOSIS — D649 Anemia, unspecified: Secondary | ICD-10-CM | POA: Diagnosis not present

## 2022-11-20 DIAGNOSIS — I480 Paroxysmal atrial fibrillation: Secondary | ICD-10-CM

## 2022-11-20 LAB — IRON: Iron: 68 ug/dL (ref 42–165)

## 2022-11-20 LAB — FERRITIN: Ferritin: 21.4 ng/mL — ABNORMAL LOW (ref 22.0–322.0)

## 2022-11-20 MED ORDER — RIVAROXABAN 20 MG PO TABS
20.0000 mg | ORAL_TABLET | Freq: Every evening | ORAL | 1 refills | Status: DC
  Filled 2022-11-20: qty 30, 30d supply, fill #0
  Filled 2022-12-26: qty 30, 30d supply, fill #1
  Filled 2023-01-23 – 2023-01-28 (×3): qty 30, 30d supply, fill #2
  Filled 2023-02-10 – 2023-02-20 (×2): qty 30, 30d supply, fill #3
  Filled 2023-03-26: qty 14, 14d supply, fill #4
  Filled 2023-04-09 – 2023-04-16 (×2): qty 30, 30d supply, fill #5
  Filled 2023-04-22 – 2023-04-25 (×2): qty 14, 14d supply, fill #5
  Filled 2023-05-06: qty 14, 14d supply, fill #6
  Filled 2023-05-20 (×2): qty 18, 18d supply, fill #7

## 2022-11-20 NOTE — Telephone Encounter (Signed)
Xarelto 20mg  refill request received. Pt is 83 years old, weight-80.7kg, Crea-0.85 on 11/05/22, last seen by Dr. 01/04/23 on 10/07/2022, Diagnosis-Afib, CrCl-77.8 mL/min; Dose is appropriate based on dosing criteria. Will send in refill to requested pharmacy.

## 2022-11-20 NOTE — Telephone Encounter (Signed)
Patient stated [facility] had not received his prescription for XARELTO 20 MG TABS tablet and he would like the prescription re-faxed.

## 2022-11-20 NOTE — Telephone Encounter (Signed)
Patient was advised of results, new prescription and needing labs.  He was scheduled to come in for labs this afternoon and he will be given IFOB kit at that time.

## 2022-11-20 NOTE — Telephone Encounter (Signed)
Please refer to xarelto refill request that was sent today by our office. Previous refill was sent by PCP to mail order.

## 2022-11-20 NOTE — Telephone Encounter (Signed)
Pt c/o medication issue:  1. Name of Medication: XARELTO 20 MG TABS tablet   2. How are you currently taking this medication (dosage and times per day)?   3. Are you having a reaction (difficulty breathing--STAT)?   4. What is your medication issue? Pt states that the medication was sent to wrong pharmacy and he would like it changed to:   [facility]    He states he needs to pick this up today due to being out.

## 2022-11-20 NOTE — Addendum Note (Signed)
Addended by: Mervin Kung A on: 11/20/2022 01:24 PM   Modules accepted: Orders

## 2022-11-25 ENCOUNTER — Telehealth: Payer: Self-pay | Admitting: *Deleted

## 2022-11-25 ENCOUNTER — Other Ambulatory Visit (INDEPENDENT_AMBULATORY_CARE_PROVIDER_SITE_OTHER): Payer: Medicare Other

## 2022-11-25 DIAGNOSIS — D649 Anemia, unspecified: Secondary | ICD-10-CM | POA: Diagnosis not present

## 2022-11-25 NOTE — Telephone Encounter (Signed)
Pt states he is having diarrhea since starting Metformin and wants to know if he can get his medication changed?

## 2022-11-26 ENCOUNTER — Other Ambulatory Visit (HOSPITAL_BASED_OUTPATIENT_CLINIC_OR_DEPARTMENT_OTHER): Payer: Self-pay

## 2022-11-26 ENCOUNTER — Encounter: Payer: Self-pay | Admitting: Family

## 2022-11-26 MED ORDER — SITAGLIPTIN PHOSPHATE 50 MG PO TABS
50.0000 mg | ORAL_TABLET | Freq: Every day | ORAL | 5 refills | Status: DC
  Filled 2022-11-26: qty 30, 30d supply, fill #0
  Filled 2022-12-26: qty 30, 30d supply, fill #1

## 2022-11-26 NOTE — Telephone Encounter (Signed)
Patient advised of medication change.

## 2022-11-26 NOTE — Telephone Encounter (Signed)
Stop metformin. Start [place].

## 2022-11-27 ENCOUNTER — Encounter: Payer: Self-pay | Admitting: Family

## 2022-11-27 ENCOUNTER — Telehealth: Payer: Self-pay | Admitting: *Deleted

## 2022-11-27 DIAGNOSIS — R195 Other fecal abnormalities: Secondary | ICD-10-CM

## 2022-11-27 LAB — FECAL OCCULT BLOOD, IMMUNOCHEMICAL: Fecal Occult Bld: POSITIVE — AB

## 2022-11-27 NOTE — Telephone Encounter (Signed)
Please advise pt that there was some hidden blood in his stool.  I would like for him to see GI for consultation.

## 2022-11-27 NOTE — Telephone Encounter (Signed)
Patient advised of results and referral. He verbalized understanding.

## 2022-11-27 NOTE — Telephone Encounter (Signed)
Received call from Gans at [place] lab reporting positive IFOB.

## 2022-11-28 ENCOUNTER — Other Ambulatory Visit (HOSPITAL_BASED_OUTPATIENT_CLINIC_OR_DEPARTMENT_OTHER): Payer: Self-pay

## 2022-11-28 ENCOUNTER — Other Ambulatory Visit: Payer: Self-pay | Admitting: Family

## 2022-11-28 DIAGNOSIS — E1142 Type 2 diabetes mellitus with diabetic polyneuropathy: Secondary | ICD-10-CM

## 2022-11-28 MED ORDER — GABAPENTIN 300 MG PO CAPS
300.0000 mg | ORAL_CAPSULE | Freq: Three times a day (TID) | ORAL | 5 refills | Status: DC
  Filled 2022-12-16: qty 90, 30d supply, fill #0
  Filled 2023-02-10: qty 90, 30d supply, fill #1
  Filled 2023-04-03: qty 270, 90d supply, fill #2

## 2022-12-03 ENCOUNTER — Encounter: Payer: Self-pay | Admitting: Neurology

## 2022-12-03 ENCOUNTER — Ambulatory Visit: Payer: Medicare Other | Admitting: Neurology

## 2022-12-03 VITALS — BP 116/73 | HR 75 | Ht 67.0 in | Wt 177.5 lb

## 2022-12-03 DIAGNOSIS — D329 Benign neoplasm of meninges, unspecified: Secondary | ICD-10-CM

## 2022-12-03 DIAGNOSIS — R569 Unspecified convulsions: Secondary | ICD-10-CM

## 2022-12-03 NOTE — Progress Notes (Signed)
PATIENT: Robert Bates DOB: Jan 05, 1941  REASON FOR VISIT: follow up HISTORY FROM: patient  HISTORY OF PRESENT ILLNESS: Today 12/03/22: Patient presented for follow-up, last visit was in June, at that time plan was to continue with Keppra extended release 750 mg at night.  He reports being compliant with the medication.  In December he was admitted to [facility] due to left hand numbness.  At that time he denies any seizure or seizure-like activity.  He reported going to bed fine and waking up with the left hand numbness.  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  He denies any seizure or seizure activity.  He remains on Keppra.  He reported upon discharge he was seen by neurosurgery who did not recommend surgery.  He has not had any additional events.     INTERVAL HISTORY 04/12/2022 Patient presents today for follow-up, at last visit in October 2021 plan was to switch patient to Keppra extended release due to side effect of the regular Keppra twice daily.  He reported he took the medication but since running out has not refilled the medication.  Currently he is not taking any antiseizure medication and has not had any additional seizures.  He still works as a Administrator, reported he had some issue at work, he went to the wrong drop off location, sometimes take the wrong turn but is able to catch himself and put himself back on the right track.  He denies being forgetful, he still independent in all ADLs and IADLs.  Denies any new complaint other than he had open heart surgery this year.    INTERVAL HISTORY 08/23/2020 Robert Bates is a 82 year old male with a history of seizures.  He returns today for follow-up.  He remains on Keppra 500 mg twice a day.  He denies any seizure events.  Reports that he started driving last month after he had been seizure-free for 6 months.  He states that with the  [place] he has noticed more drowsiness throughout the day.  Returns today for an evaluation.  HISTORY Robert Bates is a 82 year old right-handed white male with a history of 2 separate meningioma masses in the head, the largest in the right frontal area.  There is some mass-effect on the cortex of the brain.  The patient works as a Administrator, on 15 January 1092 he was at an establishment gambling.  He indicates that he only had a cup of coffee that day, he had not eaten.  He had urinary urgency and sensation of wanting to have a bowel movement but he delayed this.  He is amnestic for the seizure-like event, he remembers waking up with EMS present.  The patient apparently had witnessed jerking of the extremities.  The patient did have urinary incontinence, he did not have bowel incontinence or tongue biting.  He has never had a similar event previously.  He had a mild headache following the episode, but generally he does not have headaches.  He has not had a lot of muscle soreness following the event.  The patient denies any focal numbness or weakness of the face, arms, legs.  He denies any vision problems or speech or swallowing problems.  He denies issues controlling the bowels or the bladder.  He claims he does not drink alcohol and does not use illicit drugs.  The patient has no prior history of a concussion or  closed head injury and there is no family history of seizures.  The patient was placed on Keppra and he will was referred to this office.  His potassium level was 3.0 initially in the ER.   REVIEW OF SYSTEMS: Out of a complete 14 system review of symptoms, the patient complains only of the following symptoms, and all other reviewed systems are negative.  See HPI  ALLERGIES: Allergies  Allergen Reactions   Iodine Other (See Comments)    neck swells   Iohexol Swelling    Neck and gland swelling per patient.   Metformin And Related Diarrhea    diarrhea    HOME MEDICATIONS: Outpatient  Medications Prior to Visit  Medication Sig Dispense Refill   acetaminophen (TYLENOL) 500 MG tablet Take 1-2 tablets (500-1,000 mg total) by mouth every 6 (six) hours as needed. 30 tablet 0   albuterol (PROVENTIL) (2.5 MG/3ML) 0.083% nebulizer solution Take 3 mLs (2.5 mg total) by nebulization every 6 (six) hours as needed for wheezing or shortness of breath. 75 mL 12   albuterol (VENTOLIN HFA) 108 (90 Base) MCG/ACT inhaler Inhale 2 puffs into the lungs every 6 (six) hours as needed for wheezing or shortness of breath. 8.5 g 5   amiodarone (PACERONE) 200 MG tablet Take 1/2 tablet (100mg ) by mouth three days per week (M,W,F) 6 tablet 3   atorvastatin (LIPITOR) 80 MG tablet Take 1 tablet (80 mg total) by mouth daily. 90 tablet 3   Budeson-Glycopyrrol-Formoterol (BREZTRI AEROSPHERE) 160-9-4.8 MCG/ACT AERO Inhale 2 puffs into the lungs in the morning and at bedtime. 10.7 g 5   fluticasone (FLONASE) 50 MCG/ACT nasal spray Place 2 sprays into both nostrils daily. 16 g 1   furosemide (LASIX) 40 MG tablet Take 1 tablet (40 mg total) by mouth daily. 90 tablet 0   gabapentin (NEURONTIN) 300 MG capsule Take 1 capsule (300 mg total) by mouth 3 (three) times daily. 90 capsule 5   levETIRAcetam (KEPPRA XR) 750 MG 24 hr tablet Take 1 tablet (750 mg total) by mouth at bedtime. 30 tablet 11   Multiple Vitamin (MULTIVITAMIN WITH MINERALS) TABS tablet Take 1 tablet by mouth daily in the afternoon.     omeprazole (PRILOSEC) 40 MG capsule Take 1 capsule (40 mg total) by mouth daily. 90 capsule 1   potassium chloride (KLOR-CON) 10 MEQ tablet TAKE 2 TABLETS BY MOUTH DAILY FOR 2 DAYS THEN REDUCE TO 1 TABLET BY MOUTH DAILY (Patient taking differently: 10 mEq daily.) 92 tablet 3   rivaroxaban (XARELTO) 20 MG TABS tablet Take 1 tablet (20 mg total) by mouth every evening with supper. 90 tablet 1   sitaGLIPtin (JANUVIA) 50 MG tablet Take 1 tablet (50 mg total) by mouth daily. 30 tablet 5   tamsulosin (FLOMAX) 0.4 MG CAPS  capsule Take 1 capsule (0.4 mg total) by mouth daily. 90 capsule 1   XARELTO 20 MG TABS tablet TAKE 1 TABLET BY MOUTH EVERY DAY WITH SUPPER 90 tablet 0   No facility-administered medications prior to visit.    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   COPD (chronic obstructive pulmonary disease) (HCC)    Coronary artery disease    Diabetes  type 2, controlled ([place]) 07/31/2017   Dyspnea    Dysrhythmia    a fib   GERD (gastroesophageal reflux disease)    Hematuria    refuses work up or referral - understands risks of morbidity / mortality - 11/2008, 12/2008   Heme positive stool    History of hiatal hernia    History of kidney stones    Hyperlipemia    Meningioma ([place]) 10/25/2013   Follows with Dr. [name].    Peripheral vascular disease ([place])    Abdominal Aortic Aneursym   Pneumonia    as a child   Radiation 09/18/15-10/25/15   left lower lobe 70.2 Gy   Seizures ([place]) 02/18/2020   Tobacco abuse     PAST SURGICAL HISTORY: Past Surgical History:  Procedure Laterality Date   CHOLECYSTECTOMY N/A 07/23/2017   Procedure: LAPAROSCOPIC CHOLECYSTECTOMY;  Surgeon: Mickeal Skinner, MD;  Location: [facility];  Service: General;  Laterality: N/A;   CLIPPING OF ATRIAL APPENDAGE Left 05/08/2021   Procedure: CLIPPING OF ATRIAL APPENDAGE USING 55 ATRICLIP;  Surgeon: Wonda Olds, MD;  Location: [facility];  Service: Open Heart Surgery;  Laterality: Left;   COLONOSCOPY     CORONARY ARTERY BYPASS GRAFT N/A 05/08/2021   Procedure: CORONARY ARTERY BYPASS GRAFTING (CABG)X 3 USING LEFT INTERNAL MAMMARY ARTERY AND RIGHT GREATER SAPEHNOUS VEIN;  Surgeon: Wonda Olds, MD;  Location: [facility];  Service: Open Heart  Surgery;  Laterality: N/A;   ENDOVEIN HARVEST OF GREATER SAPHENOUS VEIN Right 05/08/2021   Procedure: ENDOVEIN HARVEST OF GREATER SAPHENOUS VEIN;  Surgeon: Wonda Olds, MD;  Location: [facility];  Service: Open Heart Surgery;  Laterality: Right;   EYE SURGERY Bilateral    Cataracts removed w/ lens implant   HERNIA REPAIR     Left 36 years ago . Right inguinal hernia repair 10-01-17 Dr. Kieth Brightly   INGUINAL HERNIA REPAIR Right 10/01/2017   Procedure: RIGHT INGUINAL HERNIA REPAIR WITH MESH;  Surgeon: Kinsinger, Arta Bruce, MD;  Location: [facility];  Service: General;  Laterality: Right;  TAP BLOCK   INSERTION OF MESH Right 10/01/2017   Procedure: INSERTION OF MESH;  Surgeon: Kieth Brightly Arta Bruce, MD;  Location: [facility];  Service: General;  Laterality: Right;   IR THORACENTESIS ASP PLEURAL SPACE W/IMG GUIDE  05/18/2021   IR THORACENTESIS ASP PLEURAL SPACE W/IMG GUIDE  06/07/2021   LEFT HEART CATH AND CORONARY ANGIOGRAPHY N/A 04/20/2021   Procedure: LEFT HEART CATH AND CORONARY ANGIOGRAPHY;  Surgeon: Wellington Hampshire, MD;  Location: [facility];  Service: Cardiovascular;  Laterality: N/A;   TEE WITHOUT CARDIOVERSION N/A 05/08/2021   Procedure: TRANSESOPHAGEAL ECHOCARDIOGRAM (TEE);  Surgeon: Wonda Olds, MD;  Location: [facility];  Service: Open Heart Surgery;  Laterality: N/A;   TONSILLECTOMY     TONSILLECTOMY     VIDEO BRONCHOSCOPY Bilateral 07/26/2015   Procedure: VIDEO BRONCHOSCOPY WITH FLUORO;  Surgeon: [name], MD;  [facility];  Service: Cardiopulmonary;  Laterality: Bilateral;   VIDEO BRONCHOSCOPY WITH ENDOBRONCHIAL NAVIGATION N/A 08/23/2015   Procedure: VIDEO BRONCHOSCOPY WITH ENDOBRONCHIAL NAVIGATION;  Surgeon: Grace Isaac, MD;  Location[facility];  Service: Thoracic;  Laterality: N/A;   VIDEO BRONCHOSCOPY WITH ENDOBRONCHIAL ULTRASOUND N/A 08/23/2015   Procedure: VIDEO BRONCHOSCOPY WITH ENDOBRONCHIAL ULTRASOUND;  Surgeon: Grace Isaac, MD;  Location: [facility];   Service: Thoracic;  Laterality: N/A;    FAMILY HISTORY: Family History  Problem Relation Age of Onset   Leukemia Father  Emphysema Father    Learning disabilities Son    Atrial fibrillation Son    Leukemia Other    Stroke Other     SOCIAL HISTORY: Social History   Socioeconomic History   Marital status: Married    Spouse name: Not on file   Number of children: 2   Years of education: Not on file   Highest education level: Not on file  Occupational History   Occupation: Retired    Fish farm manager: [employer]    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  Vaping daily started a couple weeks ago.  04/18/22 hfb  Vaping Use   Vaping Use: Former  Substance and Sexual Activity   Alcohol use: Not Currently    Alcohol/week: 0.0 standard drinks of alcohol   Drug use: No   Sexual activity: Not Currently  Other Topics Concern   Not on file  Social History Narrative   Not on file   Social Determinants of Health   Financial Resource Strain: Medium Risk (11/22/2021)   Overall Financial Resource Strain (CARDIA)    Difficulty of Paying Living Expenses: Somewhat hard  Food Insecurity: No Food Insecurity (10/02/2022)   Hunger Vital Sign    Worried About Running Out of Food in the Last Year: Never true    Ran Out of Food in the Last Year: Never true  Transportation Needs: No Transportation Needs (05/13/2022)   PRAPARE - Hydrologist (Medical): No    Lack of Transportation (Non-Medical): No  Physical Activity: Insufficiently Active (08/05/2021)   Exercise Vital Sign    Days of Exercise per Week: 2 days    Minutes of Exercise per Session:  40 min  Stress: No Stress Concern Present (10/01/2021)   [place]    Feeling of Stress : Not at all  Social Connections: Moderately Isolated (05/13/2022)   Social Connection and Isolation Panel [NHANES]    Frequency of Communication with Friends and Family: Twice a week    Frequency of Social Gatherings with Friends and Family: Once a week    Attends Religious Services: Never    Marine scientist or Organizations: No    Attends Archivist Meetings: Never    Marital Status: Married  Human resources officer Violence: Not At Risk (10/02/2022)   Humiliation, Afraid, Rape, and Kick questionnaire    Fear of Current or Ex-Partner: No    Emotionally Abused: No    Physically Abused: No    Sexually Abused: No      PHYSICAL EXAM  Vitals:   12/03/22 1311  BP: 116/73  Pulse: 75  Weight: 177 lb 8 oz (80.5 kg)  Height: 5\' 7"  (1.702 m)    Body mass index is 27.8 kg/m.  Generalized: Well developed, in no acute distress   Neurological examination  Mentation: Alert oriented to time, place, history taking. Follows all commands speech and language fluent Cranial nerve II-XII: Pupils were equal round reactive to light. Extraocular movements were full, visual field were full  on confrontational test. Head turning and shoulder shrug  were normal and symmetric. Motor: The motor testing reveals 5 over 5 strength of all 4 extremities. Good symmetric motor tone is noted throughout.  Sensory: Sensory testing is intact to soft touch on all 4 extremities. No evidence of extinction is noted.  Coordination: Cerebellar testing reveals good finger-nose-finger and heel-to-shin bilaterally.  Gait and station: Gait is normal.  Reflexes: Deep tendon reflexes are symmetric and normal bilaterally.   DIAGNOSTIC DATA (LABS, IMAGING, TESTING) - I reviewed patient records, labs, notes, testing and imaging myself where available.  Lab  Results  Component Value Date   WBC 6.8 11/15/2022   HGB 12.0 (L) 11/15/2022   HCT 37.3 (L) 11/15/2022   MCV 87.1 11/15/2022   PLT 504 (H) 11/15/2022      Component Value Date/Time   NA 138 11/05/2022 1437   NA 142 10/07/2022 1128   NA 140 06/17/2017 1315   K 3.9 11/05/2022 1437   K 4.5 06/17/2017 1315   CL 101 11/05/2022 1437   CO2 25 11/05/2022 1437   CO2 28 06/17/2017 1315   GLUCOSE 133 (H) 11/05/2022 1437   GLUCOSE 95 06/17/2017 1315   BUN 18 11/05/2022 1437   BUN 16 10/07/2022 1128   BUN 18.1 06/17/2017 1315   CREATININE 0.85 11/05/2022 1437   CREATININE 0.96 06/14/2022 1324   CREATININE 0.84 12/08/2020 1609   CREATININE 0.8 06/17/2017 1315   CALCIUM 8.4 11/05/2022 1437   CALCIUM 9.8 06/17/2017 1315   PROT 7.1 07/13/2022 1135   PROT 6.0 04/05/2022 1115   PROT 6.4 06/17/2017 1315   ALBUMIN 3.5 07/13/2022 1135   ALBUMIN 3.9 04/05/2022 1115   ALBUMIN 3.3 (L) 06/17/2017 1315   AST 14 (L) 07/13/2022 1135   AST 13 (L) 06/14/2022 1324   AST 16 06/17/2017 1315   ALT 16 10/07/2022 1128   ALT 15 06/14/2022 1324   ALT 10 06/17/2017 1315   ALKPHOS 89 07/13/2022 1135   ALKPHOS 82 06/17/2017 1315   BILITOT 0.6 07/13/2022 1135   BILITOT 0.4 06/14/2022 1324   BILITOT 0.47 06/17/2017 1315   GFRNONAA >60 07/13/2022 1135   GFRNONAA >60 06/14/2022 1324   GFRAA >60 06/20/2020 1405   Lab Results  Component Value Date   CHOL 127 10/07/2022   HDL 64 10/07/2022   LDLCALC 46 10/07/2022   LDLDIRECT 142.2 12/26/2008   TRIG 93 10/07/2022   CHOLHDL 2.0 10/07/2022   Lab Results  Component Value Date   HGBA1C 7.4 (H) 11/15/2022   No results found for: "VITAMINB12" Lab Results  Component Value Date   TSH 3.620 04/05/2022    MRI Brain 10/15/2022 1. 1.5 cm meningioma overlying the right anterior temporal lobe is new since 2016 and is increased in size since 2020 when it measured approximately 7 mm in retrospect, with vasogenic edema in the underlying temporal lobe white  matter.  2. Additional meningiomas along the right aspect of the falx at the vertex and along the planum sphenoidale are not significantly changed since 2020. Mild edema in the right parasagittal frontal lobe underlying the larger meningioma is unchanged.  3. No acute finding.    ASSESSMENT AND PLAN 82 y.o. year old male  has a past medical history of Anxiety, Aortic dilatation ([place]) (05/13/2022), Arthritis, Cancer ([place]) (2016), COPD (chronic obstructive pulmonary disease) ([place]), Coronary artery disease, Diabetes type 2, controlled ([place]) (07/31/2017), Dyspnea, Dysrhythmia, GERD (gastroesophageal reflux disease), Hematuria, Heme positive stool, History of hiatal hernia, History of kidney  stones, Hyperlipemia, Meningioma ([place]) (10/25/2013), Peripheral vascular disease ([place]), Pneumonia, Radiation (09/18/15-10/25/15), Seizures ([place]) (02/18/2020), and Tobacco abuse. here with:  1.  Seizures 2.  Meningioma  -Continue with Keppra XR 750 mg daily -Continue to follow up with PCP and Neurosurgery  -Follow-up in 6 months or sooner if needed   I have spent a total of 33 minutes dedicated to this patient today, preparing to see patient, performing a medically appropriate examination and evaluation, ordering tests and/or medications and procedures, and counseling and educating the patient/family/caregiver; independently interpreting result and communicating results to the family/patient/caregiver; and documenting clinical information in the electronic medical record.    Alric Ran, MD 12/03/2022, 1:19 PM [facility] [address] [place], [place] [address] [phone]

## 2022-12-09 ENCOUNTER — Ambulatory Visit (INDEPENDENT_AMBULATORY_CARE_PROVIDER_SITE_OTHER): Payer: Medicare Other | Admitting: Pharmacist

## 2022-12-09 ENCOUNTER — Telehealth: Payer: Self-pay | Admitting: Family

## 2022-12-09 DIAGNOSIS — I25110 Atherosclerotic heart disease of native coronary artery with unstable angina pectoris: Secondary | ICD-10-CM

## 2022-12-09 DIAGNOSIS — E785 Hyperlipidemia, unspecified: Secondary | ICD-10-CM

## 2022-12-09 DIAGNOSIS — I480 Paroxysmal atrial fibrillation: Secondary | ICD-10-CM

## 2022-12-09 DIAGNOSIS — E119 Type 2 diabetes mellitus without complications: Secondary | ICD-10-CM

## 2022-12-09 NOTE — Telephone Encounter (Signed)
Patient would like a call from Tammy to discuss medications. Please advise.

## 2022-12-09 NOTE — Telephone Encounter (Signed)
Tried to call patient back. LM on VM with CB # [phone] or [phone]

## 2022-12-09 NOTE — Progress Notes (Signed)
Pharmacy Note  12/09/2022 Name: Robert Bates MRN: [id number] DOB: 19-Nov-1940  Subjective: Robert Bates is a 82 y.o. year old male who is a primary care patient of [name], NP. Clinical Pharmacist Practitioner referral was placed to assist with medication management.    Engaged with patient by telephone for follow up visit today.  Patient called today with questions about his medications.  He reports that his wife is in hospital with pneumonia and is on a ventilator.   He is using a weekly medication reminder / container for his medications and reports this has been useful.  Since out last visit he has started Januvia for diabetes.  He should qualify for [facility] medication assistance program.  Objective: Review of patient status, including review of consultants reports, laboratory and other test data, was performed as part of comprehensive evaluation and provision of chronic care management services.   Lab Results  Component Value Date   CREATININE 0.85 11/05/2022   CREATININE 1.02 10/07/2022   CREATININE 0.90 07/13/2022    Lab Results  Component Value Date   HGBA1C 7.4 (H) 11/15/2022       Component Value Date/Time   CHOL 127 10/07/2022 1128   TRIG 93 10/07/2022 1128   HDL 64 10/07/2022 1128   CHOLHDL 2.0 10/07/2022 1128   CHOLHDL 2 07/13/2021 1336   VLDL 14.4 07/13/2021 1336   LDLCALC 46 10/07/2022 1128   LDLCALC 62 04/11/2021 1421   LDLDIRECT 142.2 12/26/2008 1032     Clinical ASCVD: Yes  The ASCVD Risk score (Arnett DK, et al., 2019) failed to calculate for the following reasons:   The 2019 ASCVD risk score is only valid for ages 21 to 6    BP Readings from Last 3 Encounters:  12/03/22 116/73  11/15/22 121/74  11/05/22 (!) 110/58     Allergies  Allergen Reactions   Iodine Other (See Comments)    neck swells   Iohexol Swelling    Neck and gland swelling per patient.   Metformin And Related Diarrhea    diarrhea    Medications Reviewed  Today     Reviewed by Cherre Robins, RPH-CPP (Pharmacist) on 12/09/22 at 1433  Med List Status: <None>   Medication Order Taking? Sig Documenting Provider Last Dose Status Informant  acetaminophen (TYLENOL) 500 MG tablet [id number] Yes Take 1-2 tablets (500-1,000 mg total) by mouth every 6 (six) hours as needed. Nani Skillern, PA-C Taking Active Multiple Informants, Self, Pharmacy Records  albuterol (PROVENTIL) (2.5 MG/3ML) 0.083% nebulizer solution [id number] No Take 3 mLs (2.5 mg total) by nebulization every 6 (six) hours as needed for wheezing or shortness of breath.  Patient not taking: Reported on 12/09/2022   Spero Geralds, MD Not Taking Active Multiple Informants, Self, Pharmacy Records           Med Note [facility], [place] B   Thu Jun 06, 2022 11:35 AM)    albuterol (VENTOLIN HFA) 108 (90 Base) MCG/ACT inhaler [id number] No Inhale 2 puffs into the lungs every 6 (six) hours as needed for wheezing or shortness of breath.  Patient not taking: Reported on 12/09/2022   [name], NP Not Taking Active Multiple Informants, Self, Pharmacy Records           Med Note [facility], [place] B   Thu Jun 06, 2022 11:36 AM)    amiodarone (PACERONE) 200 MG tablet [id number] Yes Take 1/2 tablet (100mg ) by mouth three days per week (M,W,F) Nahser, Wonda Cheng, MD Taking Active  atorvastatin (LIPITOR) 80 MG tablet [id number] Yes Take 1 tablet (80 mg total) by mouth daily. Nahser, Wonda Cheng, MD Taking Active Multiple Informants, Self, Pharmacy Records  Budeson-Glycopyrrol-Formoterol [facility] AEROSPHERE) 160-9-4.8 MCG/ACT AERO [id number] Yes Inhale 2 puffs into the lungs in the morning and at bedtime. Spero Geralds, MD Taking Active Multiple Informants, Self, Pharmacy Records           Med Note [facility], [place] B   Mon May 13, 2022 11:45 AM)    fluticasone (FLONASE) 50 MCG/ACT nasal spray [id number]  Place 2 sprays into both nostrils daily. Saguier, Percell Miller, PA-C  Active   furosemide (LASIX) 40 MG tablet [id number]  Yes Take 1 tablet (40 mg total) by mouth daily. Colon Branch, MD Taking Active   gabapentin (NEURONTIN) 300 MG capsule [id number] Yes Take 1 capsule (300 mg total) by mouth 3 (three) times daily. [name], NP Taking Active   levETIRAcetam (KEPPRA XR) 750 MG 24 hr tablet [id number] Yes Take 1 tablet (750 mg total) by mouth at bedtime. Alric Ran, MD Taking Active Multiple Informants, Self, Pharmacy Records  Multiple Vitamin (MULTIVITAMIN WITH MINERALS) TABS tablet [id number] Yes Take 1 tablet by mouth daily in the afternoon. [provider] Taking Active Multiple Informants, Self, Pharmacy Records           Med Note [facility], [place] B   Mon May 13, 2022 11:57 AM)    omeprazole (PRILOSEC) 40 MG capsule [id number] Yes Take 1 capsule (40 mg total) by mouth daily. [name], NP Taking Active   potassium chloride (KLOR-CON) 10 MEQ tablet [id number] Yes TAKE 2 TABLETS BY MOUTH DAILY FOR 2 DAYS THEN REDUCE TO 1 TABLET BY MOUTH DAILY  Patient taking differently: 10 mEq daily.   Richardson Dopp T, PA-C Taking Active Multiple Informants, Self, Pharmacy Records  rivaroxaban (XARELTO) 20 MG TABS tablet [id number] Yes Take 1 tablet (20 mg total) by mouth every evening with supper. Nahser, Wonda Cheng, MD Taking Active   sitaGLIPtin (JANUVIA) 50 MG tablet [id number] Yes Take 1 tablet (50 mg total) by mouth daily. [name], NP Taking Active   tamsulosin [facility]) 0.4 MG CAPS capsule [id number] Yes Take 1 capsule (0.4 mg total) by mouth daily. [name], NP Taking Active             Patient Active Problem List   Diagnosis Date Noted   Bilateral impacted cerumen 11/05/2022   Urinary retention 11/05/2022   Tinnitus of both ears 08/16/2022   Musculoskeletal pain 07/17/2022   Pleural effusion 05/20/2022   Aortic dilatation ([place]) 05/13/2022   Dyspnea on exertion 05/06/2022   Thoracic aortic aneurysm ([place]) 04/04/2022   Lower urinary tract symptoms (LUTS) 03/15/2022    Lumbar radiculopathy 03/15/2022   Chronic heart failure with preserved ejection fraction ([place]) 03/15/2022   Diabetic peripheral neuropathy ([place]) 01/15/2022   Unilateral inguinal hernia without obstruction or gangrene 01/15/2022   COPD exacerbation ([place]) 10/03/2021   Persistent atrial fibrillation ([place]) 05/28/2021   Secondary hypercoagulable state ([place]) 05/28/2021   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   Coronary artery disease 05/08/2021   COVID-19 virus infection 04/23/2021   Erectile dysfunction 04/11/2021   Head trauma 03/07/2021   Left leg pain 04/07/2020   Benign prostatic hyperplasia with nocturia 03/08/2020   Seizures ([place]) 02/18/2020   PAF (paroxysmal atrial fibrillation) ([place]) 08/15/2017   Diabetes type 2, controlled ([place]) 07/31/2017   COPD GOLD II  04/03/2017   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[place]) 09/06/2015  Lung cancer ([place]) 11/07/2014   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   Essential hypertension 04/29/2014   Meningioma ([place]) 10/25/2013   Low back pain 10/25/2013   Osteoarthritis 08/18/2012   Atypical chest pain 08/14/2011   KERATOSIS 10/09/2010   SCIATICA, RIGHT 10/09/2010   UNSPECIFIED HEARING LOSS 05/21/2010   Hyperlipidemia LDL goal <70 05/14/2010   Leukocytosis 05/14/2010   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   RENAL CYST, RIGHT 02/05/2010   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[place]) 01/29/2010   LIPOMA 11/17/2009   MICROSCOPIC HEMATURIA 08/11/2008   GERD 07/26/2008   DERMOID CYST 07/25/2008     Medication Assistance:  Received Breztri in 2023 from [place] and [facility] but patient reports he has several Breztri inhalers on hand currently. We have not reapplied for medication assistance program for 2024 yet at patient request . Forwarding request to Rx Team for Januvia medication assistance program.   Assessment/ Plan:  COPD - stable Continue Breztri - patient reports he has several inhalers from [place] and [place] on hand. He has requested we hold off on reapplying for medication  assistance program for 2024.  Atrila Fibrillation:   Continue to Xarelto Continue amiodarone 100mg  daily (0.5 tablet of 200mg  tablet) Mondays, Wednesdays and Fridays.  CHF Discussed signs and symptoms of CHF exacerbation - weight gain, SOB, abdominal fullness, swelling in legs or abdomen, Fatigue and weakness, changes in ability to perform usual activities, persistent cough or wheezing with white or pink blood-tinged mucus, nausea and lack of appetite Continue to weigh daily - report weight gain of more than 3 lbs in 24 hours or 5 lbs in 1 week. Continue to use furosemide 20mg  daily as needed for shortness of breath, edema, weight gain.  Hyperlipidemia: LDL goal < 55 Continue atorvastatin 80mg  daily  Medication Management:  Referred to Rx Team for Januvia medication assistance program application Reviewed med list and updated.  Reviewed refill history and discussed adherence. Reminded that atorvastatin and potassium supplement will be due in the next 10 to 14 days.  Encouraged him to continue to use weekly pill container / reminder.    Follow Up:  Telephone follow up appointment with care management team member scheduled for:  1 to 2 months   Cherre Robins, PharmD Clinical Pharmacist [place] [phone]

## 2022-12-09 NOTE — Telephone Encounter (Signed)
Patient called back - see phone visit notes.

## 2022-12-11 ENCOUNTER — Other Ambulatory Visit (HOSPITAL_COMMUNITY): Payer: Self-pay

## 2022-12-13 ENCOUNTER — Other Ambulatory Visit (HOSPITAL_BASED_OUTPATIENT_CLINIC_OR_DEPARTMENT_OTHER): Payer: Self-pay

## 2022-12-13 DIAGNOSIS — J984 Other disorders of lung: Secondary | ICD-10-CM | POA: Diagnosis not present

## 2022-12-13 DIAGNOSIS — R918 Other nonspecific abnormal finding of lung field: Secondary | ICD-10-CM | POA: Diagnosis not present

## 2022-12-13 DIAGNOSIS — C7800 Secondary malignant neoplasm of unspecified lung: Secondary | ICD-10-CM | POA: Diagnosis not present

## 2022-12-13 DIAGNOSIS — R0789 Other chest pain: Secondary | ICD-10-CM | POA: Diagnosis not present

## 2022-12-13 DIAGNOSIS — Z1152 Encounter for screening for COVID-19: Secondary | ICD-10-CM | POA: Diagnosis not present

## 2022-12-13 DIAGNOSIS — C3490 Malignant neoplasm of unspecified part of unspecified bronchus or lung: Secondary | ICD-10-CM | POA: Diagnosis not present

## 2022-12-13 DIAGNOSIS — J90 Pleural effusion, not elsewhere classified: Secondary | ICD-10-CM | POA: Diagnosis not present

## 2022-12-13 DIAGNOSIS — R0602 Shortness of breath: Secondary | ICD-10-CM | POA: Diagnosis not present

## 2022-12-13 DIAGNOSIS — C799 Secondary malignant neoplasm of unspecified site: Secondary | ICD-10-CM | POA: Diagnosis not present

## 2022-12-13 MED ORDER — HYDROCODONE-ACETAMINOPHEN 5-325 MG PO TABS
1.0000 | ORAL_TABLET | Freq: Four times a day (QID) | ORAL | 0 refills | Status: DC | PRN
  Filled 2022-12-13: qty 20, 5d supply, fill #0

## 2022-12-16 ENCOUNTER — Other Ambulatory Visit (HOSPITAL_BASED_OUTPATIENT_CLINIC_OR_DEPARTMENT_OTHER): Payer: Self-pay

## 2022-12-16 ENCOUNTER — Ambulatory Visit (INDEPENDENT_AMBULATORY_CARE_PROVIDER_SITE_OTHER): Payer: Medicare Other | Admitting: Podiatry

## 2022-12-16 VITALS — BP 124/70 | HR 68 | Temp 98.0°F

## 2022-12-16 DIAGNOSIS — M79674 Pain in right toe(s): Secondary | ICD-10-CM | POA: Diagnosis not present

## 2022-12-16 DIAGNOSIS — B351 Tinea unguium: Secondary | ICD-10-CM

## 2022-12-16 DIAGNOSIS — M79675 Pain in left toe(s): Secondary | ICD-10-CM

## 2022-12-17 NOTE — Progress Notes (Signed)
  Subjective:  Patient ID: Robert Bates, male    DOB: [birth year],  MRN: [id number]  Chief Complaint  Patient presents with   Nail Problem    Patient is here today for bilateral nail trims and nail fungus in his left great toe.    82 y.o. male presents with the above complaint. History confirmed with patient.  The nails curved and pinch and cause pain  Objective:  Physical Exam: warm, good capillary refill, no trophic changes or ulcerative lesions, normal DP and PT pulses, and normal sensory exam. Left Foot: dystrophic yellowed discolored nail plates with subungual debris and pincer nail deformity multiple toenails Right Foot: dystrophic yellowed discolored nail plates with subungual debris and pincer nail deformity multiple toenails  Assessment:   1. Pain due to onychomycosis of toenails of both feet      Plan:  Patient was evaluated and treated and all questions answered.  Discussed the etiology and treatment options for the condition in detail with the patient. Educated patient on the topical and oral treatment options for mycotic nails. Recommended debridement of the nails today. Sharp and mechanical debridement performed of all painful and mycotic nails today. Nails debrided in length and thickness using a nail nipper to level of comfort. Discussed treatment options including appropriate shoe gear. Follow up as needed for painful nails.    No follow-ups on file.

## 2022-12-26 ENCOUNTER — Other Ambulatory Visit (HOSPITAL_BASED_OUTPATIENT_CLINIC_OR_DEPARTMENT_OTHER): Payer: Self-pay

## 2022-12-27 ENCOUNTER — Other Ambulatory Visit (HOSPITAL_BASED_OUTPATIENT_CLINIC_OR_DEPARTMENT_OTHER): Payer: Self-pay

## 2022-12-27 ENCOUNTER — Telehealth: Payer: Self-pay | Admitting: Cardiovascular Disease

## 2022-12-27 NOTE — Telephone Encounter (Signed)
Spoke with the patient who states that he has a tightness in his rib cage where his scar is from CABG in 2022. He states that he has been having the same feeling ever since his surgery. He states that when he bends over he can feel the tightness and has difficulty taking a deep breath. He denies any chest pain or shortness of breath. Patient states that he has been doing good from a cardiac standpoint and taking his medications as prescribed. His blood pressure has been good.  Patient does see a pulmonologist which I recommended he follow up with.

## 2022-12-27 NOTE — Telephone Encounter (Signed)
Pt c/o of Chest Pain: STAT if CP now or developed within 24 hours  1. Are you having CP right now?  No, patient mentions having chest tightness only. Denies ever having chest pain. He would like to know if wearing a brace would help with the tightness  2. Are you experiencing any other symptoms (ex. SOB, nausea, vomiting, sweating)?  Patient states he has not had other symptoms, but he mentions fluid in left lung  3. How long have you been experiencing CP?  Patient states he has been having chest tightness since having open heart surgery  4. Is your CP continuous or coming and going?    5. Have you taken Nitroglycerin?  No  ?

## 2022-12-30 ENCOUNTER — Ambulatory Visit (INDEPENDENT_AMBULATORY_CARE_PROVIDER_SITE_OTHER): Payer: Medicare Other | Admitting: Pharmacist

## 2022-12-30 ENCOUNTER — Telehealth: Payer: Self-pay | Admitting: Family

## 2022-12-30 ENCOUNTER — Other Ambulatory Visit: Payer: Self-pay | Admitting: Family

## 2022-12-30 ENCOUNTER — Other Ambulatory Visit (HOSPITAL_BASED_OUTPATIENT_CLINIC_OR_DEPARTMENT_OTHER): Payer: Self-pay

## 2022-12-30 DIAGNOSIS — J449 Chronic obstructive pulmonary disease, unspecified: Secondary | ICD-10-CM

## 2022-12-30 DIAGNOSIS — I5032 Chronic diastolic (congestive) heart failure: Secondary | ICD-10-CM

## 2022-12-30 DIAGNOSIS — I480 Paroxysmal atrial fibrillation: Secondary | ICD-10-CM

## 2022-12-30 DIAGNOSIS — Z599 Problem related to housing and economic circumstances, unspecified: Secondary | ICD-10-CM

## 2022-12-30 DIAGNOSIS — I2510 Atherosclerotic heart disease of native coronary artery without angina pectoris: Secondary | ICD-10-CM

## 2022-12-30 DIAGNOSIS — E119 Type 2 diabetes mellitus without complications: Secondary | ICD-10-CM

## 2022-12-30 NOTE — Telephone Encounter (Signed)
Dear Lysle Rubens Inda Merlin and Icard,  Mr Bland was recently seen at a [place] ED where they performed a CT chest with the following results:  IMPRESSION: 1. Left upper lobe masslike consolidation with extension to adjacent pleura with destruction of adjacent 3rd rib, extends to left perihilar/hilar region and into the anterior left lower lobe. Suspicious for malignancy with osseous metastasis. 2. Indeterminate several subcentimeter mediastinal nodes.  I wanted both of you to be aware of these findings as they appear new.  It does not appear that he has follow up soon with either of you.    Thanks,  [name] NP

## 2022-12-30 NOTE — Progress Notes (Unsigned)
Pharmacy Note  12/09/2022 Name: Robert Bates MRN: [id number] DOB: 1941-04-13  Subjective: Robert Bates is a 82 y.o. year old male who is a primary care patient of [name], NP. Clinical Pharmacist Practitioner referral was placed to assist with medication management.    Engaged with patient by telephone for follow up visit today.  Patient's wife was in hospital for an extended period of time with COVID. She is currently in rehab at [facility]. Patient was visiting her and because shortness of breath and had chest pain. He was taken to the ED at [facility]. CT of chest noted left upper lobe masslike consolidation with extension to adjacent pleura with destruction of adjacent 3rd rib, extends to left perihilar/hilar region and into the anterior left lower lobe. Suspicious for malignancy with osseous metastasis. Indeterminate several subcentimeter mediastinal nodes. Patient was instructed to follow up with PCP and pulmonologist but he has not made appointments yet. He is concerned with cost of copayments. ($0 PCP and $30 specialist)  Past history of lung cancer with radiation. He is followed by [facility] - Dr Julien Nordmann.   COPD - patient reports he is using Breztri and still has plenty from medication assistance program in 2023.   Hyperlipidemia: taking atorvastatin daily. Last LDL 46  Afib - taking Xarelto '20mg'$  daily and amiodarone '200mg'$  - 0.5 tablet = '100mg'$  3 times per week.  Last TFTs 04/05/2022 were WNL.   Type 2 DM - patient was recently started on Januvia January 2024. Tolerating well but reports that copay is high $47. He is also worried about Medicare coverage gap.    He is using a weekly medication reminder / container for his medications and reports this has been useful.   Objective: Review of patient status, including review of consultants reports, laboratory and other test data, was performed as part of comprehensive evaluation and provision of chronic care  management services.   Lab Results  Component Value Date   CREATININE 0.85 11/05/2022   CREATININE 1.02 10/07/2022   CREATININE 0.90 07/13/2022    Lab Results  Component Value Date   HGBA1C 7.4 (H) 11/15/2022       Component Value Date/Time   CHOL 127 10/07/2022 1128   TRIG 93 10/07/2022 1128   HDL 64 10/07/2022 1128   CHOLHDL 2.0 10/07/2022 1128   CHOLHDL 2 07/13/2021 1336   VLDL 14.4 07/13/2021 1336   LDLCALC 46 10/07/2022 1128   LDLCALC 62 04/11/2021 1421   LDLDIRECT 142.2 12/26/2008 1032     Clinical ASCVD: Yes  The ASCVD Risk score (Arnett DK, et al., 2019) failed to calculate for the following reasons:   The 2019 ASCVD risk score is only valid for ages 69 to 59    BP Readings from Last 3 Encounters:  12/03/22 116/73  11/15/22 121/74  11/05/22 (!) 110/58     Allergies  Allergen Reactions   Iodine Other (See Comments)    neck swells   Iohexol Swelling    Neck and gland swelling per patient.   Metformin And Related Diarrhea    diarrhea    Medications Reviewed Today     Reviewed by Cherre Robins, RPH-CPP (Pharmacist) on 12/09/22 at 1433  Med List Status: <None>   Medication Order Taking? Sig Documenting Provider Last Dose Status Informant  acetaminophen (TYLENOL) 500 MG tablet [id number] Yes Take 1-2 tablets (500-1,000 mg total) by mouth every 6 (six) hours as needed. Nani Skillern, PA-C Taking Active Multiple Informants, Self, Pharmacy Records  albuterol (PROVENTIL) (2.5 MG/3ML) 0.083% nebulizer solution [id number] No Take 3 mLs (2.5 mg total) by nebulization every 6 (six) hours as needed for wheezing or shortness of breath.  Patient not taking: Reported on 12/09/2022   Spero Geralds, MD Not Taking Active Multiple Informants, Self, Pharmacy Records           Med Note [facility], [place] B   Thu Jun 06, 2022 11:35 AM)    albuterol (VENTOLIN HFA) 108 (90 Base) MCG/ACT inhaler [id number] No Inhale 2 puffs into the lungs every 6 (six) hours as needed for  wheezing or shortness of breath.  Patient not taking: Reported on 12/09/2022   [name], NP Not Taking Active Multiple Informants, Self, Pharmacy Records           Med Note [facility], [place] B   Thu Jun 06, 2022 11:36 AM)    amiodarone (PACERONE) 200 MG tablet [id number] Yes Take 1/2 tablet ('100mg'$ ) by mouth three days per week (M,W,F) Nahser, Wonda Cheng, MD Taking Active   atorvastatin (LIPITOR) 80 MG tablet [id number] Yes Take 1 tablet (80 mg total) by mouth daily. Nahser, Wonda Cheng, MD Taking Active Multiple Informants, Self, Pharmacy Records  Budeson-Glycopyrrol-Formoterol [facility] AEROSPHERE) 160-9-4.8 MCG/ACT AERO [id number] Yes Inhale 2 puffs into the lungs in the morning and at bedtime. Spero Geralds, MD Taking Active Multiple Informants, Self, Pharmacy Records           Med Note [facility], [place] B   Mon May 13, 2022 11:45 AM)    fluticasone (FLONASE) 50 MCG/ACT nasal spray [id number]  Place 2 sprays into both nostrils daily. Saguier, Percell Miller, PA-C  Active   furosemide (LASIX) 40 MG tablet [id number] Yes Take 1 tablet (40 mg total) by mouth daily. Colon Branch, MD Taking Active   gabapentin (NEURONTIN) 300 MG capsule [id number] Yes Take 1 capsule (300 mg total) by mouth 3 (three) times daily. [name], NP Taking Active   levETIRAcetam (KEPPRA XR) 750 MG 24 hr tablet [id number] Yes Take 1 tablet (750 mg total) by mouth at bedtime. Alric Ran, MD Taking Active Multiple Informants, Self, Pharmacy Records  Multiple Vitamin (MULTIVITAMIN WITH MINERALS) TABS tablet [id number] Yes Take 1 tablet by mouth daily in the afternoon. [provider] Taking Active Multiple Informants, Self, Pharmacy Records           Med Note [facility], [place] B   Mon May 13, 2022 11:57 AM)    omeprazole (PRILOSEC) 40 MG capsule [id number] Yes Take 1 capsule (40 mg total) by mouth daily. [name], NP Taking Active   potassium chloride (KLOR-CON) 10 MEQ tablet [id number] Yes TAKE 2 TABLETS BY MOUTH  DAILY FOR 2 DAYS THEN REDUCE TO 1 TABLET BY MOUTH DAILY  Patient taking differently: 10 mEq daily.   Richardson Dopp T, PA-C Taking Active Multiple Informants, Self, Pharmacy Records  rivaroxaban (XARELTO) 20 MG TABS tablet [id number] Yes Take 1 tablet (20 mg total) by mouth every evening with supper. Nahser, Wonda Cheng, MD Taking Active   sitaGLIPtin (JANUVIA) 50 MG tablet [id number] Yes Take 1 tablet (50 mg total) by mouth daily. [name], NP Taking Active   tamsulosin [facility]) 0.4 MG CAPS capsule [id number] Yes Take 1 capsule (0.4 mg total) by mouth daily. [name], NP Taking Active             Patient Active Problem List   Diagnosis Date Noted   Bilateral impacted cerumen 11/05/2022   Urinary retention 11/05/2022   Tinnitus of both ears  08/16/2022   Musculoskeletal pain 07/17/2022   Pleural effusion 05/20/2022   Aortic dilatation ([place]) 05/13/2022   Dyspnea on exertion 05/06/2022   Thoracic aortic aneurysm ([place]) 04/04/2022   Lower urinary tract symptoms (LUTS) 03/15/2022   Lumbar radiculopathy 03/15/2022   Chronic heart failure with preserved ejection fraction ([place]) 03/15/2022   Diabetic peripheral neuropathy ([place]) 01/15/2022   Unilateral inguinal hernia without obstruction or gangrene 01/15/2022   COPD exacerbation ([place]) 10/03/2021   Persistent atrial fibrillation ([place]) 05/28/2021   Secondary hypercoagulable state ([place]) 05/28/2021   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   Coronary artery disease 05/08/2021   COVID-19 virus infection 04/23/2021   Erectile dysfunction 04/11/2021   Head trauma 03/07/2021   Left leg pain 04/07/2020   Benign prostatic hyperplasia with nocturia 03/08/2020   Seizures ([place]) 02/18/2020   PAF (paroxysmal atrial fibrillation) ([place]) 08/15/2017   Diabetes type 2, controlled ([place]) 07/31/2017   COPD GOLD II  04/03/2017   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[place]) 09/06/2015   Lung cancer ([place]) 11/07/2014   Hepatic cyst  11/07/2014   Essential hypertension 04/29/2014   Meningioma ([place]) 10/25/2013   Low back pain 10/25/2013   Osteoarthritis 08/18/2012   Atypical chest pain 08/14/2011   KERATOSIS 10/09/2010   SCIATICA, RIGHT 10/09/2010   UNSPECIFIED HEARING LOSS 05/21/2010   Hyperlipidemia LDL goal <70 05/14/2010   Leukocytosis 05/14/2010   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   RENAL CYST, RIGHT 02/05/2010   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[place]) 01/29/2010   LIPOMA 11/17/2009   MICROSCOPIC HEMATURIA 08/11/2008   GERD 07/26/2008   DERMOID CYST 07/25/2008     Medication Assistance:  Received Breztri in 2023 from [place] and [facility] but patient reports he has several Breztri inhalers on hand currently. We have not reapplied for medication assistance program for 2024 yet at patient request . Forwarding request to Rx Team for Januvia medication assistance program.   Assessment/ Plan:  Abnormal CT - Sent message to PCP regarding CT from [facility] and recommendation for follow up COPD - stable Continue Breztri - patient reports he has several inhalers from [place] and [place] on hand. He continues to requested we hold off on reapplying for medication assistance program for 2024.   Atrila Fibrillation:   Continue to Xarelto Continue amiodarone '100mg'$  daily (0.5 tablet of '200mg'$  tablet) Mondays, Wednesdays and Fridays.   CHF Continue to use furosemide '20mg'$  daily as needed for shortness of breath, edema, weight gain.  Hyperlipidemia: LDL goal < 55 Continue atorvastatin '80mg'$  daily  Type 2 DM:  Continue Januvia '100mg'$  daily  Could also consider Farxiga in place of Januvia since SGLT2 have added CVD / CHF benefits.   Medication Management:  Reviewed med list and updated.  Reviewed refill history and discussed adherence. Reminded that atorvastatin, potassium supplement, levetiracetam and furosemide should be due - patient reports he has about 1 week left and will contact pharmacy for refills.  Encouraged him to continue to use weekly  pill container / reminder.    SDOH:  Will forward referral to [place] worker to see if any financial program to help patient with copays or monthly bills.   Follow Up:  Telephone follow up appointment with care management team member scheduled for:  2 to 4 weeks.    Cherre Robins, PharmD Clinical Pharmacist [place] [phone]

## 2022-12-31 ENCOUNTER — Other Ambulatory Visit (HOSPITAL_BASED_OUTPATIENT_CLINIC_OR_DEPARTMENT_OTHER): Payer: Self-pay

## 2022-12-31 ENCOUNTER — Telehealth: Payer: Self-pay | Admitting: Physician Assistant

## 2022-12-31 MED ORDER — SITAGLIPTIN PHOSPHATE 50 MG PO TABS
50.0000 mg | ORAL_TABLET | Freq: Every day | ORAL | 1 refills | Status: DC
  Filled 2022-12-31: qty 90, 90d supply, fill #0
  Filled 2023-01-28: qty 30, 30d supply, fill #0
  Filled 2023-03-10: qty 30, 30d supply, fill #1

## 2022-12-31 NOTE — Telephone Encounter (Signed)
Per 2/27 IB reached out to patient to schedule labs + F/up with Cassie, patient aware of appointment date and time.

## 2023-01-01 ENCOUNTER — Telehealth: Payer: Self-pay | Admitting: *Deleted

## 2023-01-01 NOTE — Telephone Encounter (Signed)
   Telephone encounter was:  Successful.  01/01/2023 Name: Robert Bates MRN: [id number] DOB: Mar 31, 1941  Robert Bates is a 82 y.o. year old male who is a primary care patient of [name], NP . The community resource team was consulted for assistance with Financial Difficulties related to co pays   Care guide performed the following interventions: Patient provided with information about care guide support team and interviewed to confirm resource needs.  Follow Up Plan:  No further follow up planned at this time. The patient has been provided with needed resources.  [facility] [phone] [address] , [place] [address] Email : [email or web address]

## 2023-01-02 ENCOUNTER — Telehealth: Payer: Self-pay

## 2023-01-02 NOTE — Telephone Encounter (Signed)
ATC LMTCB for Mr and Mrs Rode to resch Mr Cabral's appt from Dr Valeta Harms to Dr Shearon Stalls on March 5th.

## 2023-01-03 ENCOUNTER — Telehealth: Payer: Self-pay | Admitting: *Deleted

## 2023-01-03 ENCOUNTER — Telehealth: Payer: Self-pay | Admitting: Pharmacist

## 2023-01-03 MED ORDER — DAPAGLIFLOZIN PROPANEDIOL 5 MG PO TABS: 5.0000 mg | ORAL_TABLET | Freq: Every day | ORAL | 2 refills | Status: DC

## 2023-01-03 NOTE — Telephone Encounter (Signed)
Spoke with patient about change to [place]. He has just picked up Rx for Januvia 12/30/2022.  We applied for medication assistance program for Wilder Glade and he was approved.  He will start [place] '5mg'$  daily when received (it will be delivered to our office since patient has [address]). Expect it might take about 14 business days to receive.   Rx sent to [facility] for Farxiga '5mg'$  - take 1 tablet daily #90 with 2 RF.  When patient starts Wilder Glade will having him  stop Januvia, lower furosemide to '20mg'$  daily and recheck BMP in 2 weeks.

## 2023-01-03 NOTE — Telephone Encounter (Signed)
-----   Message from [name], NP sent at 01/01/2023  2:22 PM EST ----- Soraida Vickers,  That is a good idea. Do you have a form I need to fill out for the Rx at [facility]?    Thanks,  Melissa  ----- Message ----- From: Cherre Robins, RPH-CPP Sent: 01/01/2023   8:31 AM EST To: [name], NP  Patient states that Januvia cost is high and he is worried about getting into coverage gap. What do you think about changing to [place] instead? He qualified for [place] [employer] [place] medication assistance program in 2023 which could supply Faxiga for him. It would also help with edema / CHF. Would usually lower dose of furosemide to '20mg'$  daily but patient is taking as needed anyway. Recheck CMP in 2 to 3 weeks. I can also apply for Januvia medication assistance program but process usually takes longer.

## 2023-01-03 NOTE — Telephone Encounter (Signed)
   Telephone encounter was:  Successful.  01/03/2023 Name: Ryot Cornelison MRN: [id number] DOB: 01-Sep-1941  Ramadan Chapmon is a 82 y.o. year old male who is a primary care patient of [name], NP . The community resource team was consulted for assistance with Food InsecurityPatient is in need of money for zdr co pays he is no longer working and his wife is in hospital not doing well , will send food stamp application for son that resides in the home and is unemployed . Will also mail food banks to him   Care guide performed the following interventions: Patient provided with information about care guide support team and interviewed to confirm resource needs.  Follow Up Plan:  No further follow up planned at this time. The patient has been provided with needed resources.  [place] [phone] [address] , [place] [address] Email : [email or web address]

## 2023-01-06 ENCOUNTER — Ambulatory Visit: Payer: Medicare Other | Admitting: Pulmonary Disease

## 2023-01-06 NOTE — Progress Notes (Unsigned)
[facility] OFFICE PROGRESS NOTE  [name], NP [place] [address]  DIAGNOSIS: Stage IIB (T3, N0, M0) non-small cell lung cancer, squamous cell carcinoma presented with left lower lobe endobronchial lesion as well as suspicious groundglass opacity in the left upper lobe diagnosed in September 2016.    PRIOR THERAPY: Curative radiotherapy to the left upper lobe lung mass under the care of Dr. Sondra Come completed 10/25/2015.   CURRENT THERAPY: Observation   INTERVAL HISTORY: Robert Bates 82 y.o. male returns to the clinic today for a follow-up visit.  The patient was last seen by Dr. Julien Nordmann on 06/18/2022.  The patient has a history of stage IIb non-small cell lung cancer, squamous cell carcinoma that was diagnosed in 2016.  He had been on observation since that time and doing fine. Recently, the patient was at [facility] where his wife was in the ICU with COVID-19 when the patient started to experience left-sided chest pain.  He has a history of atrial fibrillation and thought perhaps he was having A-fib.  His workup in the hospital, CT scan mentioned masslike consolidation in the left upper lobe with extension to the adjacent pleura with destruction of the third rib suspicious for malignancy with osseous metastasis.  Of note the patient also had a pleural effusion during that time, although he has a history of pleural effusion the most recent being drained approximately 1 year prior.  Since being discharged, the patient is feeling fine.  He denies any fever, chills, night sweats, or unexplained weight loss.  Reports that his breathing is fine as long as he is not slouched forward.  Denies any chest pain and has only needed to take 2 Norco since being in the hospital.  Denies any unusual cough or hemoptysis. Denies any nausea, vomiting, diarrhea, or constipation.  Denies any headache or visual changes.  He is here today for evaluation and for  more detailed discussion about his current condition and recommended treatment options.    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   COPD (chronic obstructive pulmonary disease) (HCC)    Coronary artery disease    Diabetes type 2, controlled ([place]) 07/31/2017   Dyspnea    Dysrhythmia    a fib   GERD (gastroesophageal reflux disease)    Hematuria    refuses work up or referral - understands risks of morbidity / mortality - 11/2008, 12/2008   Heme positive stool    History of hiatal hernia    History of kidney stones    Hyperlipemia    Meningioma ([place]) 10/25/2013   Follows with Dr. [name].    Peripheral vascular disease ([place])    Abdominal Aortic Aneursym   Pneumonia    as a child   Radiation 09/18/15-10/25/15   left lower lobe 70.2 Gy   Seizures ([place]) 02/18/2020   Tobacco abuse     ALLERGIES:  is allergic to iodine, iohexol, and metformin and related.  MEDICATIONS:  Current Outpatient Medications  Medication Sig Dispense Refill   acetaminophen (TYLENOL) 500 MG tablet Take 1-2 tablets (500-1,000 mg total) by mouth every 6 (six) hours as needed. 30 tablet 0  albuterol (PROVENTIL) (2.5 MG/3ML) 0.083% nebulizer solution Take 3 mLs (2.5 mg total) by nebulization every 6 (six) hours as needed for wheezing or shortness of breath. 75 mL 12   albuterol (VENTOLIN HFA) 108 (90 Base) MCG/ACT inhaler Inhale 2 puffs into the lungs every 6 (six) hours as needed for wheezing or shortness of breath. 8.5 g 5   amiodarone (PACERONE) 200 MG tablet Take 1/2 tablet ('100mg'$ ) by mouth three days per week (M,W,F) 6 tablet 3   atorvastatin (LIPITOR) 80 MG tablet Take 1 tablet (80 mg total) by mouth daily. 90 tablet 3    Budeson-Glycopyrrol-Formoterol (BREZTRI AEROSPHERE) 160-9-4.8 MCG/ACT AERO Inhale 2 puffs into the lungs in the morning and at bedtime. 10.7 g 5   dapagliflozin propanediol (FARXIGA) 5 MG TABS tablet Take 1 tablet (5 mg total) by mouth daily before breakfast. 90 tablet 2   fluticasone (FLONASE) 50 MCG/ACT nasal spray Place 2 sprays into both nostrils daily. (Patient not taking: Reported on 12/30/2022) 16 g 1   furosemide (LASIX) 40 MG tablet Take 1 tablet (40 mg total) by mouth daily. 90 tablet 0   gabapentin (NEURONTIN) 300 MG capsule Take 1 capsule (300 mg total) by mouth 3 (three) times daily. 90 capsule 5   HYDROcodone-acetaminophen (NORCO/VICODIN) 5-325 MG tablet Take 1 tablet by mouth every 6 (six) hours as needed for pain for up to 5 days. Max of 4 tablets daily. 20 tablet 0   levETIRAcetam (KEPPRA XR) 750 MG 24 hr tablet Take 1 tablet (750 mg total) by mouth at bedtime. 30 tablet 11   Multiple Vitamin (MULTIVITAMIN WITH MINERALS) TABS tablet Take 1 tablet by mouth daily in the afternoon.     omeprazole (PRILOSEC) 40 MG capsule Take 1 capsule (40 mg total) by mouth daily. 90 capsule 1   potassium chloride (KLOR-CON) 10 MEQ tablet TAKE 2 TABLETS BY MOUTH DAILY FOR 2 DAYS THEN REDUCE TO 1 TABLET BY MOUTH DAILY (Patient taking differently: 10 mEq daily.) 92 tablet 3   rivaroxaban (XARELTO) 20 MG TABS tablet Take 1 tablet (20 mg total) by mouth every evening with supper. 90 tablet 1   sitaGLIPtin (JANUVIA) 50 MG tablet Take 1 tablet (50 mg total) by mouth daily. 90 tablet 1   tamsulosin (FLOMAX) 0.4 MG CAPS capsule Take 1 capsule (0.4 mg total) by mouth daily. 90 capsule 1   No current facility-administered medications for this visit.    SURGICAL HISTORY:  Past Surgical History:  Procedure Laterality Date   CHOLECYSTECTOMY N/A 07/23/2017   Procedure: LAPAROSCOPIC CHOLECYSTECTOMY;  Surgeon: Kinsinger, Arta Bruce, MD;  Location: [facility];  Service: General;  Laterality: N/A;   CLIPPING OF ATRIAL  APPENDAGE Left 05/08/2021   Procedure: CLIPPING OF ATRIAL APPENDAGE USING 39 ATRICLIP;  Surgeon: Wonda Olds, MD;  Location: [facility];  Service: Open Heart Surgery;  Laterality: Left;   COLONOSCOPY     CORONARY ARTERY BYPASS GRAFT N/A 05/08/2021   Procedure: CORONARY ARTERY BYPASS GRAFTING (CABG)X 3 USING LEFT INTERNAL MAMMARY ARTERY AND RIGHT GREATER SAPEHNOUS VEIN;  Surgeon: Wonda Olds, MD;  Location: [facility];  Service: Open Heart Surgery;  Laterality: N/A;   ENDOVEIN HARVEST OF GREATER SAPHENOUS VEIN Right 05/08/2021   Procedure: ENDOVEIN HARVEST OF GREATER SAPHENOUS VEIN;  Surgeon: Wonda Olds, MD;  Location: [facility];  Service: Open Heart Surgery;  Laterality: Right;   EYE SURGERY Bilateral    Cataracts removed w/ lens implant   HERNIA REPAIR     Left 36 years  ago . Right inguinal hernia repair 10-01-17 Dr. Kieth Brightly   INGUINAL HERNIA REPAIR Right 10/01/2017   Procedure: RIGHT INGUINAL HERNIA REPAIR WITH MESH;  Surgeon: Kinsinger, Arta Bruce, MD;  Location: [facility];  Service: General;  Laterality: Right;  TAP BLOCK   INSERTION OF MESH Right 10/01/2017   Procedure: INSERTION OF MESH;  Surgeon: Kieth Brightly Arta Bruce, MD;  Location: [facility];  Service: General;  Laterality: Right;   IR THORACENTESIS ASP PLEURAL SPACE W/IMG GUIDE  05/18/2021   IR THORACENTESIS ASP PLEURAL SPACE W/IMG GUIDE  06/07/2021   LEFT HEART CATH AND CORONARY ANGIOGRAPHY N/A 04/20/2021   Procedure: LEFT HEART CATH AND CORONARY ANGIOGRAPHY;  Surgeon: Wellington Hampshire, MD;  Location: [facility];  Service: Cardiovascular;  Laterality: N/A;   TEE WITHOUT CARDIOVERSION N/A 05/08/2021   Procedure: TRANSESOPHAGEAL ECHOCARDIOGRAM (TEE);  Surgeon: Wonda Olds, MD;  Location: [facility];  Service: Open Heart Surgery;  Laterality: N/A;   TONSILLECTOMY     TONSILLECTOMY     VIDEO BRONCHOSCOPY Bilateral 07/26/2015   Procedure: VIDEO BRONCHOSCOPY WITH FLUORO;  Surgeon: [name], MD;  [facility];  Service:  Cardiopulmonary;  Laterality: Bilateral;   VIDEO BRONCHOSCOPY WITH ENDOBRONCHIAL NAVIGATION N/A 08/23/2015   Procedure: VIDEO BRONCHOSCOPY WITH ENDOBRONCHIAL NAVIGATION;  Surgeon: Grace Isaac, MD;  Location: [facility];  Service: Thoracic;  Laterality: N/A;   VIDEO BRONCHOSCOPY WITH ENDOBRONCHIAL ULTRASOUND N/A 08/23/2015   Procedure: VIDEO BRONCHOSCOPY WITH ENDOBRONCHIAL ULTRASOUND;  Surgeon: Grace Isaac, MD;  Location: [facility];  Service: Thoracic;  Laterality: N/A;    REVIEW OF SYSTEMS:   Review of Systems  Constitutional: Negative for appetite change, chills, fatigue, fever and unexpected weight change.  HENT:   Negative for mouth sores, nosebleeds, sore throat and trouble swallowing.   Eyes: Negative for eye problems and icterus.  Respiratory: Negative for cough, hemoptysis, shortness of breath and wheezing.   Cardiovascular: Negative for chest pain and leg swelling.  Gastrointestinal: Negative for abdominal pain, constipation, diarrhea, nausea and vomiting.  Genitourinary: Negative for bladder incontinence, difficulty urinating, dysuria, frequency and hematuria.   Musculoskeletal: Negative for back pain, gait problem, neck pain and neck stiffness.  Skin: Negative for itching and rash.  Neurological: Negative for dizziness, extremity weakness, gait problem, headaches, light-headedness and seizures.  Hematological: Negative for adenopathy. Does not bruise/bleed easily.  Psychiatric/Behavioral: Negative for confusion, depression and sleep disturbance. The patient is not nervous/anxious.     PHYSICAL EXAMINATION:  There were no vitals taken for this visit.  ECOG PERFORMANCE STATUS: 1  Physical Exam  Constitutional: Oriented to person, place, and time and well-developed, well-nourished, and in no distress. HENT:  Head: Normocephalic and atraumatic.  Mouth/Throat: Oropharynx is clear and moist. No oropharyngeal exudate.  Eyes: Conjunctivae are normal. Right eye exhibits no  discharge. Left eye exhibits no discharge. No scleral icterus.  Neck: Normal range of motion. Neck supple.  Cardiovascular: Normal rate, regular rhythm, normal heart sounds and intact distal pulses.   Pulmonary/Chest: Effort normal and breath sounds normal. No respiratory distress. No wheezes. No rales.  Abdominal: Soft. Bowel sounds are normal. Exhibits no distension and no mass. There is no tenderness.  Musculoskeletal: Normal range of motion. Exhibits no edema.  Lymphadenopathy:    No cervical adenopathy.  Neurological: Alert and oriented to person, place, and time. Exhibits normal muscle tone. Gait normal. Coordination normal.  Skin: Skin is warm and dry. No rash noted. Not diaphoretic. No erythema. No pallor.  Psychiatric: Mood, memory and  judgment normal.  Vitals reviewed.  LABORATORY DATA: Lab Results  Component Value Date   WBC 6.8 11/15/2022   HGB 12.0 (L) 11/15/2022   HCT 37.3 (L) 11/15/2022   MCV 87.1 11/15/2022   PLT 504 (H) 11/15/2022      Chemistry      Component Value Date/Time   NA 138 11/05/2022 1437   NA 142 10/07/2022 1128   NA 140 06/17/2017 1315   K 3.9 11/05/2022 1437   K 4.5 06/17/2017 1315   CL 101 11/05/2022 1437   CO2 25 11/05/2022 1437   CO2 28 06/17/2017 1315   BUN 18 11/05/2022 1437   BUN 16 10/07/2022 1128   BUN 18.1 06/17/2017 1315   CREATININE 0.85 11/05/2022 1437   CREATININE 0.96 06/14/2022 1324   CREATININE 0.84 12/08/2020 1609   CREATININE 0.8 06/17/2017 1315      Component Value Date/Time   CALCIUM 8.4 11/05/2022 1437   CALCIUM 9.8 06/17/2017 1315   ALKPHOS 89 07/13/2022 1135   ALKPHOS 82 06/17/2017 1315   AST 14 (L) 07/13/2022 1135   AST 13 (L) 06/14/2022 1324   AST 16 06/17/2017 1315   ALT 16 10/07/2022 1128   ALT 15 06/14/2022 1324   ALT 10 06/17/2017 1315   BILITOT 0.6 07/13/2022 1135   BILITOT 0.4 06/14/2022 1324   BILITOT 0.47 06/17/2017 1315       RADIOGRAPHIC STUDIES:  No results found.   ASSESSMENT/PLAN:   This is a very pleasant 82 year old Caucasian male diagnosed with stage IIB non-small cell lung cancer, squamous cell carcinoma who had a left upper lobe endobronchial lesion in addition to left upper lobe suspicious groundglass opacity which was diagnosed in September 2016 initially.  He underwent curative radiotherapy under the care of Dr. Sondra Come in 2016.    In February 2024, patient had a CT scan performed at [facility] which mentioned a new left upper lobe masslike consolidation with extension into the adjacent pleura with destruction of the third rib.  The patient was seen with Dr. Julien Nordmann today.  Dr. Julien Nordmann had a lengthy discussion with the patient about his current condition and further workup.  Dr. Julien Nordmann feels that this may be related to his scarring from his prior radiation however, Dr. Julien Nordmann recommends a PET scan to ensure no disease recurrence.  We will see the patient back for follow-up visit in 3 weeks for evaluation to review his PET scan results.  The patient was advised to call immediately if he has any concerning symptoms in the interval. The patient voices understanding of current disease status and treatment options and is in agreement with the current care plan. All questions were answered. The patient knows to call the clinic with any problems, questions or concerns. We can certainly see the patient much sooner if necessary  No orders of the defined types were placed in this encounter.   [place] Cisar L Tameka Hoiland, PA-C 01/06/23  ADDENDUM: Hematology/Oncology Attending: I had a face-to-face encounter with the patient today.  I reviewed his record, lab, recent scan results and recommended his care plan.  This is a very pleasant 83 years old white male diagnosed with stage IIb non-small cell lung cancer, squamous cell carcinoma in September 2016 status post curative radiotherapy to the left upper lobe lung mass and he has been in observation since December 2016. The patient  has been in observation since that time and has been doing fine.  He was accompanying his wife who was The patient at [facility]  in the ICU with COVID-19 when he experienced left-sided chest pain and he was found to have atrial fibrillation.  During his evaluation in the hospital he had CT scan of the chest that showed masslike consolidation in the left upper lobe with extension to the adjacent pleura and destruction of the third rib suspicious for malignancy with osseous metastasis. He was referred to [place] today for evaluation and recommendation regarding his condition.  The patient is feeling fine with no concerning issues. I recommended for him to have a PET scan for further evaluation of his condition and to rule out local disease recurrence. I will see him back for follow-up visit in 3 weeks for evaluation and discussion of his PET scan results and further recommendation regarding his condition. The patient was advised to call immediately if he has any other concerning symptoms in the interval. The total time spent in the appointment was 30 minutes. Disclaimer: This note was dictated with voice recognition software. Similar sounding words can inadvertently be transcribed and may be missed upon review. Eilleen Kempf, MD

## 2023-01-08 ENCOUNTER — Inpatient Hospital Stay: Payer: Medicare Other | Attending: Physician Assistant | Admitting: Physician Assistant

## 2023-01-08 ENCOUNTER — Other Ambulatory Visit: Payer: Self-pay

## 2023-01-08 ENCOUNTER — Inpatient Hospital Stay: Payer: Medicare Other

## 2023-01-08 VITALS — BP 140/79 | HR 60 | Temp 97.5°F | Resp 16 | Wt 182.6 lb

## 2023-01-08 DIAGNOSIS — Z08 Encounter for follow-up examination after completed treatment for malignant neoplasm: Secondary | ICD-10-CM | POA: Diagnosis not present

## 2023-01-08 DIAGNOSIS — C3490 Malignant neoplasm of unspecified part of unspecified bronchus or lung: Secondary | ICD-10-CM

## 2023-01-08 DIAGNOSIS — Z85118 Personal history of other malignant neoplasm of bronchus and lung: Secondary | ICD-10-CM | POA: Insufficient documentation

## 2023-01-08 LAB — CBC WITH DIFFERENTIAL (CANCER CENTER ONLY)
Abs Immature Granulocytes: 0.01 10*3/uL (ref 0.00–0.07)
Basophils Absolute: 0 10*3/uL (ref 0.0–0.1)
Basophils Relative: 0 %
Eosinophils Absolute: 0.1 10*3/uL (ref 0.0–0.5)
Eosinophils Relative: 2 %
HCT: 34.8 % — ABNORMAL LOW (ref 39.0–52.0)
Hemoglobin: 11.2 g/dL — ABNORMAL LOW (ref 13.0–17.0)
Immature Granulocytes: 0 %
Lymphocytes Relative: 16 %
Lymphs Abs: 1.2 10*3/uL (ref 0.7–4.0)
MCH: 28.7 pg (ref 26.0–34.0)
MCHC: 32.2 g/dL (ref 30.0–36.0)
MCV: 89.2 fL (ref 80.0–100.0)
Monocytes Absolute: 0.8 10*3/uL (ref 0.1–1.0)
Monocytes Relative: 11 %
Neutro Abs: 5.2 10*3/uL (ref 1.7–7.7)
Neutrophils Relative %: 71 %
Platelet Count: 216 10*3/uL (ref 150–400)
RBC: 3.9 MIL/uL — ABNORMAL LOW (ref 4.22–5.81)
RDW: 15.9 % — ABNORMAL HIGH (ref 11.5–15.5)
WBC Count: 7.4 10*3/uL (ref 4.0–10.5)
nRBC: 0 % (ref 0.0–0.2)

## 2023-01-08 LAB — CMP (CANCER CENTER ONLY)
ALT: 9 U/L (ref 0–44)
AST: 12 U/L — ABNORMAL LOW (ref 15–41)
Albumin: 3.7 g/dL (ref 3.5–5.0)
Alkaline Phosphatase: 77 U/L (ref 38–126)
Anion gap: 5 (ref 5–15)
BUN: 14 mg/dL (ref 8–23)
CO2: 30 mmol/L (ref 22–32)
Calcium: 9.1 mg/dL (ref 8.9–10.3)
Chloride: 106 mmol/L (ref 98–111)
Creatinine: 0.83 mg/dL (ref 0.61–1.24)
GFR, Estimated: >60 mL/min (ref >60)
Glucose, Bld: 92 mg/dL (ref 70–99)
Potassium: 3.8 mmol/L (ref 3.5–5.1)
Sodium: 141 mmol/L (ref 135–145)
Total Bilirubin: 0.4 mg/dL (ref 0.3–1.2)
Total Protein: 6.1 g/dL — ABNORMAL LOW (ref 6.5–8.1)

## 2023-01-10 ENCOUNTER — Encounter: Payer: Self-pay | Admitting: Internal Medicine

## 2023-01-10 ENCOUNTER — Ambulatory Visit (INDEPENDENT_AMBULATORY_CARE_PROVIDER_SITE_OTHER): Payer: Medicare Other

## 2023-01-10 ENCOUNTER — Ambulatory Visit (INDEPENDENT_AMBULATORY_CARE_PROVIDER_SITE_OTHER): Payer: Medicare Other | Admitting: Internal Medicine

## 2023-01-10 VITALS — BP 126/74 | HR 59 | Temp 97.6°F | Resp 18 | Ht 69.0 in | Wt 184.4 lb

## 2023-01-10 DIAGNOSIS — J90 Pleural effusion, not elsewhere classified: Secondary | ICD-10-CM

## 2023-01-10 DIAGNOSIS — R918 Other nonspecific abnormal finding of lung field: Secondary | ICD-10-CM | POA: Diagnosis not present

## 2023-01-10 NOTE — Patient Instructions (Addendum)
Please schedule follow up scheduled with myself in 3 months.  If my schedule is not open yet, we will contact you with a reminder closer to that time. Please call [phone] if you haven't heard from [place] a month before.   We drained your pleural fluid today on the left side. I am sending that to the lab for analysis and call you with the results when they return. I checking it to make sure there is no cancer. I will call you if you need to come see me sooner.  Follow up with your scheduled PET scan.  You can take tylenol or ibuprofen in low doses for the next couple days in case you are having pain at the site of your fluid removal. If you develop worsening chest pain or can't catch your breath, or the pain doesn't feel better with tylenol or ibuprofen. Go to the emergency room over the weekend to be evaluated.   Chest xray looks good after procedure- no evidence of complications from the procedure we did today.

## 2023-01-10 NOTE — Progress Notes (Signed)
Robert Bates    [id number]    [birth year]  Primary Care Physician:O'Sullivan, Lenna Sciara, NP Date of Appointment: 01/10/2023 Established Patient Visit  Chief complaint:   Chief Complaint  Patient presents with   Follow-up    SOB this morning.  3-4 weeks ago, had one other episode of SOB     HPI: Robert Bates is a 82 y.o.  man with COPD Gold stage 2 FEV1 62% of predicted, lung cancer s/p curative radiation in 2016 and CAD s/p CABG.   Interval Updates: Here for follow up for COPD and lung cancer.   His wife has been hospitalized recently. He had an episode of chest pain while visitng her and ended up in the ED.   He had a CT scan in Feb 2024 which showed new mass like consolidation which extended into the pleural with third rib destruction on the left. Dr. Julien Nordmann recommends pet scan for follow up. Unclear if this is related to his previous cancer or if this is a recurrence. Of note his imaging in August 2023 showed stable post-radiation changes on the left side from his former cancer.   He was also told he had a pleural effusion on the left side. There was not enough to drain at the time. He is having worsening shortness of breath and chest pain.   Stilll taking breztri inhaler 2 puffs.   No fevers chills, night sweats.    I have reviewed the patient's family social and past medical history and updated as appropriate.   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   COPD (chronic obstructive pulmonary disease) (HCC)    Coronary artery disease    Diabetes type 2, controlled ([place]) 07/31/2017   Dyspnea    Dysrhythmia    a fib   GERD (gastroesophageal reflux disease)    Hematuria    refuses work up or referral -  understands risks of morbidity / mortality - 11/2008, 12/2008   Heme positive stool    History of hiatal hernia    History of kidney stones    Hyperlipemia    Meningioma ([place]) 10/25/2013   Follows with Dr. [name].    Peripheral vascular disease ([place])    Abdominal Aortic Aneursym   Pneumonia    as a child   Radiation 09/18/15-10/25/15   left lower lobe 70.2 Gy   Seizures ([place]) 02/18/2020   Tobacco abuse     Past Surgical History:  Procedure Laterality Date   CHOLECYSTECTOMY N/A 07/23/2017   Procedure: LAPAROSCOPIC CHOLECYSTECTOMY;  Surgeon: Mickeal Skinner, MD;  Location: [facility];  Service: General;  Laterality: N/A;   CLIPPING OF ATRIAL APPENDAGE Left 05/08/2021   Procedure: CLIPPING OF ATRIAL APPENDAGE USING 15 ATRICLIP;  Surgeon: Wonda Olds, MD;  Location: [facility];  Service: Open Heart Surgery;  Laterality: Left;   COLONOSCOPY     CORONARY ARTERY BYPASS GRAFT N/A 05/08/2021   Procedure: CORONARY ARTERY BYPASS GRAFTING (CABG)X 3 USING LEFT INTERNAL MAMMARY ARTERY AND RIGHT GREATER SAPEHNOUS VEIN;  Surgeon: Wonda Olds, MD;  Location: [facility];  Service: Open Heart Surgery;  Laterality: N/A;   ENDOVEIN HARVEST OF GREATER SAPHENOUS VEIN Right  05/08/2021   Procedure: ENDOVEIN HARVEST OF GREATER SAPHENOUS VEIN;  Surgeon: Wonda Olds, MD;  Location: [facility];  Service: Open Heart Surgery;  Laterality: Right;   EYE SURGERY Bilateral    Cataracts removed w/ lens implant   HERNIA REPAIR     Left 36 years ago . Right inguinal hernia repair 10-01-17 Dr. Kieth Brightly   INGUINAL HERNIA REPAIR Right 10/01/2017   Procedure: RIGHT INGUINAL HERNIA REPAIR WITH MESH;  Surgeon: Kinsinger, Arta Bruce, MD;  Location: [facility];  Service: General;  Laterality: Right;  TAP BLOCK   INSERTION OF MESH Right 10/01/2017   Procedure: INSERTION OF MESH;  Surgeon: Kieth Brightly Arta Bruce, MD;  Location: [facility];  Service: General;  Laterality: Right;   IR THORACENTESIS ASP PLEURAL SPACE W/IMG GUIDE   05/18/2021   IR THORACENTESIS ASP PLEURAL SPACE W/IMG GUIDE  06/07/2021   LEFT HEART CATH AND CORONARY ANGIOGRAPHY N/A 04/20/2021   Procedure: LEFT HEART CATH AND CORONARY ANGIOGRAPHY;  Surgeon: Wellington Hampshire, MD;  Location: [facility];  Service: Cardiovascular;  Laterality: N/A;   TEE WITHOUT CARDIOVERSION N/A 05/08/2021   Procedure: TRANSESOPHAGEAL ECHOCARDIOGRAM (TEE);  Surgeon: Wonda Olds, MD;  Location: [facility];  Service: Open Heart Surgery;  Laterality: N/A;   TONSILLECTOMY     TONSILLECTOMY     VIDEO BRONCHOSCOPY Bilateral 07/26/2015   Procedure: VIDEO BRONCHOSCOPY WITH FLUORO;  Surgeon: [name], MD;  [facility];  Service: Cardiopulmonary;  Laterality: Bilateral;   VIDEO BRONCHOSCOPY WITH ENDOBRONCHIAL NAVIGATION N/A 08/23/2015   Procedure: VIDEO BRONCHOSCOPY WITH ENDOBRONCHIAL NAVIGATION;  Surgeon: Grace Isaac, MD;  [facility];  Service: Thoracic;  Laterality: N/A;   VIDEO BRONCHOSCOPY WITH ENDOBRONCHIAL ULTRASOUND N/A 08/23/2015   Procedure: VIDEO BRONCHOSCOPY WITH ENDOBRONCHIAL ULTRASOUND;  Surgeon: Grace Isaac, MD;  Location: [facility];  Service: Thoracic;  Laterality: N/A;    Family History  Problem Relation Age of Onset   Leukemia Father    Emphysema Father    Learning disabilities Son    Atrial fibrillation Son    Leukemia Other    Stroke Other     Social History   Occupational History   Occupation: Retired    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  Vaping daily started a couple weeks ago.  04/18/22 hfb  Vaping Use   Vaping Use: Former  Substance and Sexual Activity   Alcohol  use: Not Currently    Alcohol/week: 0.0 standard drinks of alcohol   Drug use: No   Sexual activity: Not Currently     Physical Exam: Blood pressure 118/64, pulse 63, temperature 97.6 F (36.4 C), temperature source Oral, height '5\' 9"'$  (1.753 m), weight 184 lb 6.4 oz (83.6 kg), SpO2 98 %.  Gen:      No acute distressLungs:    diminished. No wheezes or crackles CV:         Regular rate and rhythm; no murmurs, rubs, or gallops.  No pedal edema   Data Reviewed: Imaging: I have  personally reviewed the chest xray Nov 2022 with chronic small left effusion and scarring.  Left pleural space    PFTs:     Latest Ref Rng & Units 05/19/2017   10:02 AM 08/25/2015    9:55 AM  PFT Results  FVC-Pre L 2.84  3.43   FVC-Predicted Pre % 73  87   FVC-Post L 3.16  3.87   FVC-Predicted Post % 81  98   Pre FEV1/FVC % % 61  67   Post FEV1/FCV % % 61  65   FEV1-Pre L 1.74  2.31   FEV1-Predicted Pre % 62  81   FEV1-Post L 1.93  2.50   DLCO uncorrected ml/min/mmHg 16.14  18.35   DLCO UNC% % 54  61   DLCO corrected ml/min/mmHg 16.10    DLCO COR %Predicted % 54    DLVA Predicted % 71  67   TLC L 5.61  6.24   TLC % Predicted % 84  93   RV % Predicted % 108  121    I have personally reviewed the patient's PFTs and moderately airflow limitation  Labs:  Immunization status: Immunization History  Administered Date(s) Administered   Fluad Quad(high Dose 65+) 07/23/2019, 09/04/2020, 07/13/2021, 08/16/2022   Influenza Split 08/02/2011, 08/18/2012   Influenza Whole 08/17/2008   Influenza, High Dose Seasonal PF 08/09/2015, 09/30/2016, 07/30/2017, 08/07/2018   Influenza,inj,Quad PF,6+ Mos 07/27/2013, 08/09/2014   PFIZER Comirnaty(Gray Top)Covid-19 Tri-Sucrose Vaccine 02/16/2021   PFIZER(Purple Top)SARS-COV-2 Vaccination 01/28/2020, 02/21/2020, 09/04/2020   Pfizer Covid-19 Vaccine Bivalent Booster 77yr & up 08/03/2021   Pneumococcal Conjugate-13 08/09/2014   Pneumococcal Polysaccharide-23  05/14/2010   Td 07/25/2008    External Records Personally Reviewed: echocardiogram with diastolic dysfunction  Assessment:  Moderate COPD 62% of predicted, nmo change CAD s/p CABG July 2022 Left Pleural effusion, with malignant/atypical cells - no recurrence Atrial Fibrillation Stage 2B lung cancer of the left side s/p radiation, awaiting repeat scan to evaluate for progression  Plan/Recommendations: I performed bedside POC lung ultrasound today. Images showed sizeable left pleural effusion amenable to thoracentesis. I discussed risks and benefits with him he is agreeable to proceed with procedure. See separate procedure note.   continue Breztri 2 puffs twice a day.   Agree with pet scan. Will also send fluid today for cytology.   He had some pain post-procedure chest xray reviewed and was reassuring - no ptx. Given return to care precautions. Otherwise would manage pain with anti-inflammatories like ibuprofen. Vitals reassuring pre and post procedure.   Return to Care: Return in about 3 months (around 04/12/2023).   NLenice Llamas MD [facility] [place]

## 2023-01-10 NOTE — Progress Notes (Signed)
Thoracentesis  Procedure Note  Terrell Odoms  [id number]  01-13-1941  Date:01/10/23  Time:11:21 AM   Provider Performing:Ky Moskowitz Chauncey Cruel Shearon Stalls   Procedure: Thoracentesis with imaging guidance [id number])  Indication(s) Pleural Effusion  Consent Risks of the procedure as well as the alternatives and risks of each were explained to the patient and/or caregiver.  Consent for the procedure was obtained and is signed in the bedside chart  Anesthesia Topical only with 1% lidocaine    Time Out Verified patient identification, verified procedure, site/side was marked, verified correct patient position, special equipment/implants available, medications/allergies/relevant history reviewed, required imaging and test results available.   Sterile Technique Maximal sterile technique including full sterile barrier drape, hand hygiene, sterile gown, sterile gloves, mask, hair covering, sterile ultrasound probe cover (if used).  Procedure Description Ultrasound was used to identify appropriate pleural anatomy for placement and overlying skin marked.  Area of drainage cleaned and draped in sterile fashion. Lidocaine was used to anesthetize the skin and subcutaneous tissue.  850 cc's of serous appearing fluid was drained from the left pleural space. Procedure ended when he had pain and when the fluid stopped draining Catheter then removed and bandaid applied to site.   Complications/Tolerance None; patient tolerated the procedure well. Chest X-ray is ordered to confirm no post-procedural complication.   EBL Minimal   Specimen(s) Pleural fluid

## 2023-01-13 LAB — CELL COUNT AND DIFFERENTIAL, PLEURAL FLUID
Basophils, %: 0 %
Eosinophils, %: 0 %
Lymphocytes, %: 75 %
Mesothelial, %: 0 %
Monocyte/Macrophage %: 4 %
Neutrophils, %: 21 %
Total Nucleated Cell Ct: 771 cells/uL

## 2023-01-13 LAB — GLUCOSE, PLEURAL OR PERITONEAL FLUID: Glucose, Pleural fluid: 140 mg/dL

## 2023-01-14 LAB — PROTEIN, PLEURAL FLUID: PROTEIN, TOTAL, PLEURAL FLUID: 3.2 g/dL

## 2023-01-14 LAB — PLEURAL FLUID CYTOLOGY

## 2023-01-14 LAB — LACTATE DEHYDROGENASE, PLEURAL FLUID: LD, PLEURAL FLUID: 128 U/L

## 2023-01-15 ENCOUNTER — Telehealth: Payer: Self-pay | Admitting: Internal Medicine

## 2023-01-15 ENCOUNTER — Telehealth: Payer: Medicare Other

## 2023-01-15 NOTE — Telephone Encounter (Signed)
Called patient to review results of his thoracentesis done in office. Cytology negative for malignancy, although has been "suspicious" in the past. It is lymphocyte predominant. The next time this recurs, given his history of left sided lung cancer s/p radiation, I would probably refer him for pleurodesis vs pleurx.  He also informed me that his wife passed away yesterday after a prolonged hospitalization in the ICU.

## 2023-01-17 ENCOUNTER — Ambulatory Visit (INDEPENDENT_AMBULATORY_CARE_PROVIDER_SITE_OTHER): Payer: Medicare Other | Admitting: Family

## 2023-01-17 ENCOUNTER — Telehealth: Payer: Self-pay | Admitting: Family

## 2023-01-17 ENCOUNTER — Other Ambulatory Visit: Payer: Self-pay | Admitting: Cardiovascular Disease

## 2023-01-17 ENCOUNTER — Other Ambulatory Visit (HOSPITAL_BASED_OUTPATIENT_CLINIC_OR_DEPARTMENT_OTHER): Payer: Self-pay

## 2023-01-17 ENCOUNTER — Encounter: Payer: Self-pay | Admitting: Family

## 2023-01-17 VITALS — BP 134/74 | HR 60 | Temp 97.6°F | Resp 16 | Wt 184.0 lb

## 2023-01-17 DIAGNOSIS — F4320 Adjustment disorder, unspecified: Secondary | ICD-10-CM | POA: Insufficient documentation

## 2023-01-17 DIAGNOSIS — R109 Unspecified abdominal pain: Secondary | ICD-10-CM | POA: Diagnosis not present

## 2023-01-17 DIAGNOSIS — F4321 Adjustment disorder with depressed mood: Secondary | ICD-10-CM | POA: Insufficient documentation

## 2023-01-17 DIAGNOSIS — M5414 Radiculopathy, thoracic region: Secondary | ICD-10-CM | POA: Diagnosis not present

## 2023-01-17 LAB — POC URINALSYSI DIPSTICK (AUTOMATED)
Blood, UA: NEGATIVE
Glucose, UA: NEGATIVE
Ketones, UA: NEGATIVE
Nitrite, UA: NEGATIVE
Protein, UA: NEGATIVE
Spec Grav, UA: 1.015 (ref 1.010–1.025)
Urobilinogen, UA: 0.2 E.U./dL
pH, UA: 6 (ref 5.0–8.0)

## 2023-01-17 LAB — BODY FLUID CULTURE

## 2023-01-17 MED ORDER — ATORVASTATIN CALCIUM 80 MG PO TABS
80.0000 mg | ORAL_TABLET | Freq: Every day | ORAL | 2 refills | Status: DC
  Filled 2023-01-17: qty 90, 90d supply, fill #0
  Filled 2023-04-16 – 2023-04-25 (×2): qty 90, 90d supply, fill #1
  Filled 2023-04-25 – 2023-08-20 (×3): qty 90, 90d supply, fill #2

## 2023-01-17 MED ORDER — METHYLPREDNISOLONE 4 MG PO TBPK
ORAL_TABLET | ORAL | 0 refills | Status: DC
  Filled 2023-01-17: qty 21, 6d supply, fill #0

## 2023-01-17 NOTE — Progress Notes (Signed)
Subjective:   By signing my name below, I, Madelin Rear, attest that this documentation has been prepared under the direction and in the presence of [name], NP. 01/17/2023.   Patient ID: Robert Bates, male    DOB: April 13, 1941, 82 y.o.   MRN: [id number]  Chief Complaint  Patient presents with   Flank Pain    Patient complains of right flank pain, with nausea    HPI Patient is in today for an office visit.  Flank pain:  He complains of right low back pain that was persistent last night, and has recurred today. He describes this as feeling like it is tightening; he demonstrates by closing his fist. Previously, this pain would occur after walking or standing for prolonged periods. After sitting and resting the pain would eventually resolve. However, he states that the pain is no longer improving with rest when it occurs. He denies any recent musculoskeletal injuries or pulled muscles. At home he has pain medications that have been largely ineffective.  Nausea:  He also complains of nausea, with onset this morning. He has not been aware of any gastric pain.  Grief:  Recently his wife passed away from complications of 0000000 and pneumonia. Her memorial service is this weekend.   He recently underwent thoracentesis for pleural effusion possibly due to recurrent lung cancer. He has an upcoming appointment with oncology and chest CT and PET scan are both scheduled for 01/23/23.   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   COPD (chronic obstructive pulmonary disease) (HCC)    Coronary artery disease    Diabetes type 2, controlled ([place]) 07/31/2017   Dyspnea    Dysrhythmia    a fib   GERD (gastroesophageal reflux disease)     Hematuria    refuses work up or referral - understands risks of morbidity / mortality - 11/2008, 12/2008   Heme positive stool    History of hiatal hernia    History of kidney stones    Hyperlipemia    Meningioma ([place]) 10/25/2013   Follows with Dr. [name].    Peripheral vascular disease ([place])    Abdominal Aortic Aneursym   Pneumonia    as a child   Radiation 09/18/15-10/25/15   left lower lobe 70.2 Gy   Seizures ([place]) 02/18/2020   Tobacco abuse     Past Surgical History:  Procedure Laterality Date   CHOLECYSTECTOMY N/A 07/23/2017   Procedure: LAPAROSCOPIC CHOLECYSTECTOMY;  Surgeon: Mickeal Skinner, MD;  Location: [facility];  Service: General;  Laterality: N/A;   CLIPPING OF ATRIAL APPENDAGE Left 05/08/2021   Procedure: CLIPPING OF ATRIAL APPENDAGE USING 42 ATRICLIP;  Surgeon: Wonda Olds, MD;  Location: [facility];  Service: Open Heart Surgery;  Laterality: Left;   COLONOSCOPY     CORONARY ARTERY BYPASS GRAFT N/A 05/08/2021   Procedure: CORONARY ARTERY BYPASS GRAFTING (CABG)X 3 USING LEFT INTERNAL MAMMARY ARTERY AND RIGHT GREATER SAPEHNOUS VEIN;  Surgeon: Wonda Olds, MD;  Location: [facility];  Service: Open Heart Surgery;  Laterality: N/A;   ENDOVEIN HARVEST OF GREATER SAPHENOUS VEIN Right 05/08/2021   Procedure: ENDOVEIN HARVEST OF GREATER SAPHENOUS VEIN;  Surgeon: Wonda Olds, MD;  Location: [facility]  OR;  Service: Open Heart Surgery;  Laterality: Right;   EYE SURGERY Bilateral    Cataracts removed w/ lens implant   HERNIA REPAIR     Left 36 years ago . Right inguinal hernia repair 10-01-17 Dr. Kieth Brightly   INGUINAL HERNIA REPAIR Right 10/01/2017   Procedure: RIGHT INGUINAL HERNIA REPAIR WITH MESH;  Surgeon: Kinsinger, Arta Bruce, MD;  Location: [facility];  Service: General;  Laterality: Right;  TAP BLOCK   INSERTION OF MESH Right 10/01/2017   Procedure: INSERTION OF MESH;  Surgeon: Kieth Brightly Arta Bruce, MD;  Location: [facility];  Service: General;  Laterality: Right;   IR  THORACENTESIS ASP PLEURAL SPACE W/IMG GUIDE  05/18/2021   IR THORACENTESIS ASP PLEURAL SPACE W/IMG GUIDE  06/07/2021   LEFT HEART CATH AND CORONARY ANGIOGRAPHY N/A 04/20/2021   Procedure: LEFT HEART CATH AND CORONARY ANGIOGRAPHY;  Surgeon: Wellington Hampshire, MD;  Location: [facility];  Service: Cardiovascular;  Laterality: N/A;   TEE WITHOUT CARDIOVERSION N/A 05/08/2021   Procedure: TRANSESOPHAGEAL ECHOCARDIOGRAM (TEE);  Surgeon: Wonda Olds, MD;  Location: [facility];  Service: Open Heart Surgery;  Laterality: N/A;   TONSILLECTOMY     TONSILLECTOMY     VIDEO BRONCHOSCOPY Bilateral 07/26/2015   Procedure: VIDEO BRONCHOSCOPY WITH FLUORO;  Surgeon: [name], MD;  [facility];  Service: Cardiopulmonary;  Laterality: Bilateral;   VIDEO BRONCHOSCOPY WITH ENDOBRONCHIAL NAVIGATION N/A 08/23/2015   Procedure: VIDEO BRONCHOSCOPY WITH ENDOBRONCHIAL NAVIGATION;  Surgeon: Grace Isaac, MD;  Location: [facility];  Service: Thoracic;  Laterality: N/A;   VIDEO BRONCHOSCOPY WITH ENDOBRONCHIAL ULTRASOUND N/A 08/23/2015   Procedure: VIDEO BRONCHOSCOPY WITH ENDOBRONCHIAL ULTRASOUND;  Surgeon: Grace Isaac, MD;  Location: [facility];  Service: Thoracic;  Laterality: N/A;    Family History  Problem Relation Age of Onset   Leukemia Father    Emphysema Father    Learning disabilities Son    Atrial fibrillation Son    Leukemia Other    Stroke Other     Social History   Socioeconomic History   Marital status: Widowed    Spouse name: Not on file   Number of children: 2   Years of education: Not on file   Highest education level: Not on file  Occupational History   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  Vaping daily started a couple weeks ago.  04/18/22 hfb  Vaping Use   Vaping Use: Former  Substance and Sexual Activity   Alcohol use: Not Currently    Alcohol/week: 0.0 standard drinks of alcohol   Drug use: No   Sexual activity: Not Currently  Other Topics Concern   Not on file  Social History Narrative   Not on file   Social Determinants of Health   Financial Resource Strain: Medium Risk (01/03/2023)   Overall Financial Resource Strain (CARDIA)    Difficulty of Paying Living Expenses: Somewhat hard  Food Insecurity: Food Insecurity Present (  01/03/2023)   Hunger Vital Sign    Worried About Running Out of Food in the Last Year: Sometimes true    Ran Out of Food in the Last Year: Sometimes true  Transportation Needs: No Transportation Needs (05/13/2022)   PRAPARE - Hydrologist (Medical): No    Lack of Transportation (Non-Medical): No  Physical Activity: Insufficiently Active (08/05/2021)   Exercise Vital Sign    Days of Exercise per Week: 2 days    Minutes of Exercise per Session: 40 min  Stress: No Stress Concern Present (10/01/2021)   [place]    Feeling of Stress : Not at all  Social Connections: Moderately Isolated (05/13/2022)   Social Connection and Isolation Panel [NHANES]    Frequency of Communication with Friends and Family: Twice a week    Frequency of Social Gatherings with Friends and Family: Once a week    Attends Religious Services: Never    Marine scientist or Organizations: No    Attends Archivist Meetings: Never    Marital Status: Married  Human resources officer Violence: Not At Risk (10/02/2022)   Humiliation, Afraid, Rape, and Kick questionnaire    Fear of Current or Ex-Partner: No    Emotionally Abused: No    Physically  Abused: No    Sexually Abused: No    Outpatient Medications Prior to Visit  Medication Sig Dispense Refill   acetaminophen (TYLENOL) 500 MG tablet Take 1-2 tablets (500-1,000 mg total) by mouth every 6 (six) hours as needed. 30 tablet 0   albuterol (PROVENTIL) (2.5 MG/3ML) 0.083% nebulizer solution Take 3 mLs (2.5 mg total) by nebulization every 6 (six) hours as needed for wheezing or shortness of breath. 75 mL 12   albuterol (VENTOLIN HFA) 108 (90 Base) MCG/ACT inhaler Inhale 2 puffs into the lungs every 6 (six) hours as needed for wheezing or shortness of breath. 8.5 g 5   amiodarone (PACERONE) 200 MG tablet Take 1/2 tablet (100mg ) by mouth three days per week (M,W,F) 6 tablet 3   atorvastatin (LIPITOR) 80 MG tablet Take 1 tablet (80 mg total) by mouth daily. 90 tablet 2   Budeson-Glycopyrrol-Formoterol (BREZTRI AEROSPHERE) 160-9-4.8 MCG/ACT AERO Inhale 2 puffs into the lungs in the morning and at bedtime. 10.7 g 5   dapagliflozin propanediol (FARXIGA) 5 MG TABS tablet Take 1 tablet (5 mg total) by mouth daily before breakfast. 90 tablet 2   diltiazem (CARDIZEM CD) 120 MG 24 hr capsule Take by mouth.     fluticasone (FLONASE) 50 MCG/ACT nasal spray Place 2 sprays into both nostrils daily. 16 g 1   furosemide (LASIX) 40 MG tablet Take 1 tablet (40 mg total) by mouth daily. 90 tablet 0   gabapentin (NEURONTIN) 300 MG capsule Take 1 capsule (300 mg total) by mouth 3 (three) times daily. 90 capsule 5   HYDROcodone-acetaminophen (NORCO/VICODIN) 5-325 MG tablet Take 1 tablet by mouth every 6 (six) hours as needed for pain for up to 5 days. Max of 4 tablets daily. 20 tablet 0   levETIRAcetam (KEPPRA XR) 750 MG 24 hr tablet Take 1 tablet (750 mg total) by mouth at bedtime. 30 tablet 11   Multiple Vitamin (MULTIVITAMIN WITH MINERALS) TABS tablet Take 1 tablet by mouth daily in the afternoon.     omeprazole (PRILOSEC) 40 MG capsule Take 1 capsule (40 mg total) by mouth daily. 90 capsule 1   potassium  chloride (KLOR-CON) 10  MEQ tablet TAKE 2 TABLETS BY MOUTH DAILY FOR 2 DAYS THEN REDUCE TO 1 TABLET BY MOUTH DAILY (Patient taking differently: 10 mEq daily.) 92 tablet 3   rivaroxaban (XARELTO) 20 MG TABS tablet Take 1 tablet (20 mg total) by mouth every evening with supper. 90 tablet 1   sitaGLIPtin (JANUVIA) 50 MG tablet Take 1 tablet (50 mg total) by mouth daily. 90 tablet 1   tamsulosin (FLOMAX) 0.4 MG CAPS capsule Take 1 capsule (0.4 mg total) by mouth daily. 90 capsule 1   No facility-administered medications prior to visit.    Allergies  Allergen Reactions   Iodine Other (See Comments)    neck swells   Iohexol Swelling    Neck and gland swelling per patient.   Metformin And Related Diarrhea    diarrhea    Review of Systems  Gastrointestinal:  Positive for nausea.  Genitourinary:  Positive for flank pain.  Psychiatric/Behavioral:         +Grief       Objective:    Physical Exam Constitutional:      General: He is not in acute distress.    Appearance: Normal appearance. He is not ill-appearing.  HENT:     Head: Normocephalic and atraumatic.     Right Ear: Tympanic membrane, ear canal and external ear normal.     Left Ear: Tympanic membrane, ear canal and external ear normal.  Eyes:     Extraocular Movements: Extraocular movements intact.     Pupils: Pupils are equal, round, and reactive to light.  Cardiovascular:     Rate and Rhythm: Normal rate and regular rhythm.     Heart sounds: Normal heart sounds. No murmur heard.    No gallop.  Pulmonary:     Effort: Pulmonary effort is normal. No respiratory distress.     Breath sounds: Normal breath sounds. No wheezing or rales.  Skin:    General: Skin is warm and dry.  Neurological:     General: No focal deficit present.     Mental Status: He is alert and oriented to person, place, and time.  Psychiatric:        Mood and Affect: Mood normal.        Behavior: Behavior normal.     BP 134/74 (BP Location: Right  Arm, Patient Position: Sitting, Cuff Size: Small)   Pulse 60   Temp 97.6 F (36.4 C) (Oral)   Resp 16   Wt 184 lb (83.5 kg)   SpO2 99%   BMI 27.17 kg/m  Wt Readings from Last 3 Encounters:  01/17/23 184 lb (83.5 kg)  01/10/23 184 lb 6.4 oz (83.6 kg)  01/08/23 182 lb 9.6 oz (82.8 kg)      Assessment & Plan:   Problem List Items Addressed This Visit       Unprioritized   Thoracic radiculopathy - Primary    The nature of pain and location is most consistent with thoracic radiculopathy around T11/T12.  Will give trial of medrol dose pak and refer for physical therapy.       Relevant Orders   Ambulatory referral to Physical Therapy   CBC w/Diff   Comp Met (CMET)   POCT Urinalysis Dipstick (Automated) (Completed)   Urine Culture   Grief reaction    He is mourning the loss of his wife. Support provided. I suspect his nausea may be stress related, but I asked him to let me know if his symptoms worsen.  Meds ordered this encounter  Medications   methylPREDNISolone (MEDROL DOSEPAK) 4 MG TBPK tablet    Sig: Please take per package instructions    Dispense:  21 tablet    Refill:  0    Order Specific Question:   Supervising Provider    Answer:   Penni Homans A [[id number]]    I, Nance Pear, NP, personally preformed the services described in this documentation.  All medical record entries made by the scribe were at my direction and in my presence.  I have reviewed the chart and discharge instructions (if applicable) and agree that the record reflects my personal performance and is accurate and complete.  01/17/2023.  I,Mathew Stumpf,acting as a Education administrator for [employer], NP.,have documented all relevant documentation on the behalf of Nance Pear, NP,as directed by  Nance Pear, NP while in the presence of Nance Pear, NP.   Nance Pear, NP

## 2023-01-17 NOTE — Telephone Encounter (Signed)
Initial Comment Caller states that he having some lower back pain. Caller states that he is also nauseated as well. Translation No Nurse Assessment Nurse: Cherre Robins, RN, Ria Comment Date/Time (Eastern Time): 01/17/2023 11:29:44 AM Confirm and document reason for call. If symptomatic, describe symptoms. ---Caller states he is having right side lower back pain and nausea. Denies dysuria. No fever. Also c/o chest congestion. Does the patient have any new or worsening symptoms? ---Yes Will a triage be completed? ---Yes Related visit to physician within the last 2 weeks? ---Yes Does the PT have any chronic conditions? (i.e. diabetes, asthma, this includes High risk factors for pregnancy, etc.) ---Yes List chronic conditions. ---hx of open heart surgery Is this a behavioral health or substance abuse call? ---No Guidelines Guideline Title Affirmed Question Affirmed Notes Nurse Date/Time (Eastern Time) Flank Pain [1] SEVERE pain (e.g., excruciating, scale 8-10) AND [2] present > 1 hour Weiss-Hilton, RN, Ria Comment 01/17/2023 11:32:38 AM Disp. Time Eilene Ghazi Time) Disposition Final User 01/17/2023 11:35:42 AM Go to ED Now Yes Cherre Robins, RN, Ria Comment Final Disposition 01/17/2023 11:35:42 AM Go to ED Now Yes Weiss-Hilton, RN, Ria Comment PLEASE NOTE: All timestamps contained within this report are represented as [place] Standard Time. CONFIDENTIALTY NOTICE: This fax transmission is intended only for the addressee. It contains information that is legally privileged, confidential or otherwise protected from use or disclosure. If you are not the intended recipient, you are strictly prohibited from reviewing, disclosing, copying using or disseminating any of this information or taking any action in reliance on or regarding this information. If you have received this fax in error, please notify us immediately by telephone so that we can arrange for its return to [place]. Phone: [phone], Toll-Free:  [phone], Fax: [phone] Page: 2 of 2 Call Id: [id number] [place] Disagree/Comply Comply Caller Understands Yes PreDisposition InappropriateToAsk Care Advice Given Per Guideline * You need to be seen in the Emergency Department. GO TO ED NOW: * Go to the ED at ___________ [place] now. Drive carefully. ANOTHER ADULT SHOULD DRIVE: * It is better and safer if another adult drives instead of you. CARE ADVICE given per Flank Pain (Adult) guideline. Comments User: Carolan Clines, RN Date/Time Eilene Ghazi Time): 01/17/2023 11:32:36 AM Also on blood thinners and high cholesterol. User: Carolan Clines, RN Date/Time Eilene Ghazi Time): 01/17/2023 11:33:49 AM states back pain is 9/10 on pain scale. Referrals GO TO FACILITY UNDECIDEd

## 2023-01-17 NOTE — Telephone Encounter (Signed)
Pt called back, transferred to triage per Melissa Osullivan's recommendation. Pt experiencing nausea with kidney pain.

## 2023-01-17 NOTE — Assessment & Plan Note (Addendum)
The nature of pain and location is most consistent with thoracic radiculopathy around T11/T12.  Will give trial of medrol dose pak and refer for physical therapy.

## 2023-01-17 NOTE — Telephone Encounter (Signed)
Appt today w/ Melissa.  

## 2023-01-17 NOTE — Assessment & Plan Note (Signed)
He is mourning the loss of his wife. Support provided. I suspect his nausea may be stress related, but I asked him to let me know if his symptoms worsen.

## 2023-01-18 LAB — COMPREHENSIVE METABOLIC PANEL
AG Ratio: 1.6 (calc) (ref 1.0–2.5)
ALT: 9 U/L (ref 9–46)
AST: 11 U/L (ref 10–35)
Albumin: 3.6 g/dL (ref 3.6–5.1)
Alkaline phosphatase (APISO): 75 U/L (ref 35–144)
BUN: 16 mg/dL (ref 7–25)
CO2: 28 mmol/L (ref 20–32)
Calcium: 8.4 mg/dL — ABNORMAL LOW (ref 8.6–10.3)
Chloride: 105 mmol/L (ref 98–110)
Creat: 0.84 mg/dL (ref 0.70–1.22)
Globulin: 2.2 g/dL (calc) (ref 1.9–3.7)
Glucose, Bld: 94 mg/dL (ref 65–99)
Potassium: 4 mmol/L (ref 3.5–5.3)
Sodium: 141 mmol/L (ref 135–146)
Total Bilirubin: 0.5 mg/dL (ref 0.2–1.2)
Total Protein: 5.8 g/dL — ABNORMAL LOW (ref 6.1–8.1)

## 2023-01-18 LAB — CBC WITH DIFFERENTIAL/PLATELET
Absolute Monocytes: 630 cells/uL (ref 200–950)
Basophils Absolute: 27 cells/uL (ref 0–200)
Basophils Relative: 0.4 %
Eosinophils Absolute: 141 cells/uL (ref 15–500)
Eosinophils Relative: 2.1 %
HCT: 34.4 % — ABNORMAL LOW (ref 38.5–50.0)
Hemoglobin: 11.1 g/dL — ABNORMAL LOW (ref 13.2–17.1)
Lymphs Abs: 1266 cells/uL (ref 850–3900)
MCH: 28.2 pg (ref 27.0–33.0)
MCHC: 32.3 g/dL (ref 32.0–36.0)
MCV: 87.5 fL (ref 80.0–100.0)
MPV: 10.6 fL (ref 7.5–12.5)
Monocytes Relative: 9.4 %
Neutro Abs: 4636 cells/uL (ref 1500–7800)
Neutrophils Relative %: 69.2 %
Platelets: 247 10*3/uL (ref 140–400)
RBC: 3.93 10*6/uL — ABNORMAL LOW (ref 4.20–5.80)
RDW: 15.1 % — ABNORMAL HIGH (ref 11.0–15.0)
Total Lymphocyte: 18.9 %
WBC: 6.7 10*3/uL (ref 3.8–10.8)

## 2023-01-18 LAB — URINE CULTURE
MICRO NUMBER:: 14698688
SPECIMEN QUALITY:: ADEQUATE

## 2023-01-22 ENCOUNTER — Telehealth: Payer: Self-pay

## 2023-01-22 NOTE — Telephone Encounter (Signed)
3 bottles of Farxiga 5 mg came today for pt.  Pt stated he will come get medication on 01/23/23

## 2023-01-22 NOTE — Telephone Encounter (Signed)
Pt called about medication , labs reviewed from 01/17/23

## 2023-01-23 ENCOUNTER — Encounter (HOSPITAL_COMMUNITY)
Admission: RE | Admit: 2023-01-23 | Discharge: 2023-01-23 | Disposition: A | Discharge status: Home / Self Care | Payer: Medicare Other | Source: Ambulatory Visit | Attending: Physician Assistant | Admitting: Physician Assistant

## 2023-01-23 ENCOUNTER — Other Ambulatory Visit (HOSPITAL_BASED_OUTPATIENT_CLINIC_OR_DEPARTMENT_OTHER): Payer: Self-pay

## 2023-01-23 ENCOUNTER — Ambulatory Visit (HOSPITAL_COMMUNITY)
Admission: RE | Admit: 2023-01-23 | Discharge: 2023-01-23 | Disposition: A | Discharge status: Home / Self Care | Payer: Medicare Other | Source: Ambulatory Visit | Attending: Internal Medicine | Admitting: Internal Medicine

## 2023-01-23 DIAGNOSIS — J439 Emphysema, unspecified: Secondary | ICD-10-CM | POA: Diagnosis not present

## 2023-01-23 DIAGNOSIS — C3490 Malignant neoplasm of unspecified part of unspecified bronchus or lung: Secondary | ICD-10-CM | POA: Insufficient documentation

## 2023-01-23 DIAGNOSIS — J90 Pleural effusion, not elsewhere classified: Secondary | ICD-10-CM | POA: Diagnosis not present

## 2023-01-23 LAB — GLUCOSE, CAPILLARY: Glucose-Capillary: 110 mg/dL — ABNORMAL HIGH (ref 70–99)

## 2023-01-23 MED ORDER — FLUDEOXYGLUCOSE F - 18 (FDG) INJECTION
9.2000 | Freq: Once | INTRAVENOUS | Status: AC
  Administered 2023-01-23: 9.6 via INTRAVENOUS

## 2023-01-24 ENCOUNTER — Other Ambulatory Visit: Payer: Self-pay

## 2023-01-24 DIAGNOSIS — C3490 Malignant neoplasm of unspecified part of unspecified bronchus or lung: Secondary | ICD-10-CM

## 2023-01-28 ENCOUNTER — Inpatient Hospital Stay: Payer: Medicare Other | Admitting: Internal Medicine

## 2023-01-28 ENCOUNTER — Inpatient Hospital Stay: Payer: Medicare Other

## 2023-01-28 ENCOUNTER — Other Ambulatory Visit (HOSPITAL_BASED_OUTPATIENT_CLINIC_OR_DEPARTMENT_OTHER): Payer: Self-pay

## 2023-01-29 ENCOUNTER — Inpatient Hospital Stay (HOSPITAL_BASED_OUTPATIENT_CLINIC_OR_DEPARTMENT_OTHER): Payer: Medicare Other | Admitting: Internal Medicine

## 2023-01-29 ENCOUNTER — Other Ambulatory Visit (HOSPITAL_BASED_OUTPATIENT_CLINIC_OR_DEPARTMENT_OTHER): Payer: Self-pay

## 2023-01-29 DIAGNOSIS — C3432 Malignant neoplasm of lower lobe, left bronchus or lung: Secondary | ICD-10-CM | POA: Diagnosis not present

## 2023-01-29 DIAGNOSIS — C3490 Malignant neoplasm of unspecified part of unspecified bronchus or lung: Secondary | ICD-10-CM

## 2023-01-29 NOTE — Progress Notes (Signed)
[place] [phone]   [phone]  PROGRESS NOTE FOR TELEMEDICINE VISITS  [name], NP [address] [place] [address]  I connected withNAME@ on 01/29/23 at 11:30 AM EDT by telephone visit and verified that I am speaking with the correct person using two identifiers.   I discussed the limitations, risks, security and privacy concerns of performing an evaluation and management service by telemedicine and the availability of in-person appointments. I also discussed with the patient that there may be a patient responsible charge related to this service. The patient expressed understanding and agreed to proceed.  Other persons participating in the visit and their role in the encounter:  None  Patient's location:  Home Provider's location: [place].  DIAGNOSIS: Stage IIB (T3, N0, M0) non-small cell lung cancer, squamous cell carcinoma presented with left lower lobe endobronchial lesion as well as suspicious groundglass opacity in the left upper lobe diagnosed in September 2016.     PRIOR THERAPY: Curative radiotherapy to the left upper lobe lung mass under the care of Dr. Sondra Come completed 10/25/2015.    CURRENT THERAPY: Observation   INTERVAL HISTORY: Robert Bates 82 y.o. male has a telephone virtual visit with me today for evaluation and discussion of his recent PET scan results.  The patient is feeling fine with no concerning complaints except for shortness of breath with exertion.  He has no chest pain, cough or hemoptysis.  He has no nausea, vomiting, diarrhea or constipation.  He has no headache or visual changes.  He denied having any significant weight loss or night sweats.  He has some suspicious finding on previous imaging studies concerning for disease recurrence.  The patient had a PET scan performed recently and we are having the visit for discussion of his scan results.  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   COPD (chronic obstructive pulmonary disease) (HCC)    Coronary artery disease    Diabetes type 2, controlled ([place]) 07/31/2017   Dyspnea    Dysrhythmia    a fib   GERD (gastroesophageal reflux disease)    Hematuria    refuses work up or referral - understands risks of morbidity / mortality - 11/2008, 12/2008   Heme positive stool    History of hiatal hernia    History of kidney stones    Hyperlipemia    Meningioma ([place]) 10/25/2013   Follows with Dr. [name].    Peripheral vascular disease ([place])    Abdominal Aortic Aneursym   Pneumonia    as a child   Radiation 09/18/15-10/25/15   left lower lobe 70.2 Gy   Seizures ([place]) 02/18/2020   Tobacco abuse     ALLERGIES:  is allergic to iodine, iohexol, and metformin and related.  MEDICATIONS:  Current Outpatient Medications  Medication Sig Dispense Refill   acetaminophen (TYLENOL) 500 MG tablet Take 1-2 tablets (500-1,000 mg total) by mouth every 6 (six) hours as needed. 30 tablet 0   albuterol (PROVENTIL) (2.5 MG/3ML) 0.083% nebulizer solution Take 3 mLs (2.5 mg total) by nebulization every 6 (six) hours as needed for wheezing or shortness of breath. 75 mL 12  albuterol (VENTOLIN HFA) 108 (90 Base) MCG/ACT inhaler Inhale 2 puffs into the lungs every 6 (six) hours as needed for wheezing or shortness of breath. 8.5 g 5   amiodarone (PACERONE) 200 MG tablet Take 1/2 tablet (100mg ) by mouth three days per week (M,W,F) 6 tablet 3   atorvastatin (LIPITOR) 80 MG tablet Take 1 tablet (80 mg total) by mouth daily. 90 tablet 2   Budeson-Glycopyrrol-Formoterol (BREZTRI AEROSPHERE) 160-9-4.8 MCG/ACT AERO Inhale 2 puffs into the lungs in the morning  and at bedtime. 10.7 g 5   dapagliflozin propanediol (FARXIGA) 5 MG TABS tablet Take 1 tablet (5 mg total) by mouth daily before breakfast. 90 tablet 2   diltiazem (CARDIZEM CD) 120 MG 24 hr capsule Take by mouth.     fluticasone (FLONASE) 50 MCG/ACT nasal spray Place 2 sprays into both nostrils daily. 16 g 1   furosemide (LASIX) 40 MG tablet Take 1 tablet (40 mg total) by mouth daily. 90 tablet 0   gabapentin (NEURONTIN) 300 MG capsule Take 1 capsule (300 mg total) by mouth 3 (three) times daily. 90 capsule 5   HYDROcodone-acetaminophen (NORCO/VICODIN) 5-325 MG tablet Take 1 tablet by mouth every 6 (six) hours as needed for pain for up to 5 days. Max of 4 tablets daily. 20 tablet 0   levETIRAcetam (KEPPRA XR) 750 MG 24 hr tablet Take 1 tablet (750 mg total) by mouth at bedtime. 30 tablet 11   methylPREDNISolone (MEDROL DOSEPAK) 4 MG TBPK tablet Please take per package instructions 21 tablet 0   Multiple Vitamin (MULTIVITAMIN WITH MINERALS) TABS tablet Take 1 tablet by mouth daily in the afternoon.     omeprazole (PRILOSEC) 40 MG capsule Take 1 capsule (40 mg total) by mouth daily. 90 capsule 1   potassium chloride (KLOR-CON) 10 MEQ tablet TAKE 2 TABLETS BY MOUTH DAILY FOR 2 DAYS THEN REDUCE TO 1 TABLET BY MOUTH DAILY (Patient taking differently: 10 mEq daily.) 92 tablet 3   rivaroxaban (XARELTO) 20 MG TABS tablet Take 1 tablet (20 mg total) by mouth every evening with supper. 90 tablet 1   sitaGLIPtin (JANUVIA) 50 MG tablet Take 1 tablet (50 mg total) by mouth daily. 90 tablet 1   tamsulosin (FLOMAX) 0.4 MG CAPS capsule Take 1 capsule (0.4 mg total) by mouth daily. 90 capsule 1   No current facility-administered medications for this visit.    SURGICAL HISTORY:  Past Surgical History:  Procedure Laterality Date   CHOLECYSTECTOMY N/A 07/23/2017   Procedure: LAPAROSCOPIC CHOLECYSTECTOMY;  Surgeon: Kinsinger, Arta Bruce, MD;  Location: [facility];  Service: General;  Laterality: N/A;   CLIPPING OF  ATRIAL APPENDAGE Left 05/08/2021   Procedure: CLIPPING OF ATRIAL APPENDAGE USING 42 ATRICLIP;  Surgeon: Wonda Olds, MD;  Location: [facility];  Service: Open Heart Surgery;  Laterality: Left;   COLONOSCOPY     CORONARY ARTERY BYPASS GRAFT N/A 05/08/2021   Procedure: CORONARY ARTERY BYPASS GRAFTING (CABG)X 3 USING LEFT INTERNAL MAMMARY ARTERY AND RIGHT GREATER SAPEHNOUS VEIN;  Surgeon: Wonda Olds, MD;  Location: [facility];  Service: Open Heart Surgery;  Laterality: N/A;   ENDOVEIN HARVEST OF GREATER SAPHENOUS VEIN Right 05/08/2021   Procedure: ENDOVEIN HARVEST OF GREATER SAPHENOUS VEIN;  Surgeon: Wonda Olds, MD;  Location: [facility];  Service: Open Heart Surgery;  Laterality: Right;   EYE SURGERY Bilateral    Cataracts removed w/ lens implant   HERNIA REPAIR     Left 36 years ago . Right inguinal hernia repair  10-01-17 Dr. Kieth Brightly   INGUINAL HERNIA REPAIR Right 10/01/2017   Procedure: RIGHT INGUINAL HERNIA REPAIR WITH MESH;  Surgeon: Kinsinger, Arta Bruce, MD;  Location: [facility];  Service: General;  Laterality: Right;  TAP BLOCK   INSERTION OF MESH Right 10/01/2017   Procedure: INSERTION OF MESH;  Surgeon: Kieth Brightly Arta Bruce, MD;  Location: [facility];  Service: General;  Laterality: Right;   IR THORACENTESIS ASP PLEURAL SPACE W/IMG GUIDE  05/18/2021   IR THORACENTESIS ASP PLEURAL SPACE W/IMG GUIDE  06/07/2021   LEFT HEART CATH AND CORONARY ANGIOGRAPHY N/A 04/20/2021   Procedure: LEFT HEART CATH AND CORONARY ANGIOGRAPHY;  Surgeon: Wellington Hampshire, MD;  Location: [facility];  Service: Cardiovascular;  Laterality: N/A;   TEE WITHOUT CARDIOVERSION N/A 05/08/2021   Procedure: TRANSESOPHAGEAL ECHOCARDIOGRAM (TEE);  Surgeon: Wonda Olds, MD;  Location: [facility];  Service: Open Heart Surgery;  Laterality: N/A;   TONSILLECTOMY     TONSILLECTOMY     VIDEO BRONCHOSCOPY Bilateral 07/26/2015   Procedure: VIDEO BRONCHOSCOPY WITH FLUORO;  Surgeon: [name], MD;  [facility];   Service: Cardiopulmonary;  Laterality: Bilateral;   VIDEO BRONCHOSCOPY WITH ENDOBRONCHIAL NAVIGATION N/A 08/23/2015   Procedure: VIDEO BRONCHOSCOPY WITH ENDOBRONCHIAL NAVIGATION;  Surgeon: Grace Isaac, MD;  Location: [facility];  Service: Thoracic;  Laterality: N/A;   VIDEO BRONCHOSCOPY WITH ENDOBRONCHIAL ULTRASOUND N/A 08/23/2015   Procedure: VIDEO BRONCHOSCOPY WITH ENDOBRONCHIAL ULTRASOUND;  Surgeon: Grace Isaac, MD;  Location: [facility];  Service: Thoracic;  Laterality: N/A;    REVIEW OF SYSTEMS:  A comprehensive review of systems was negative except for: Respiratory: positive for cough and dyspnea on exertion    LABORATORY DATA: Lab Results  Component Value Date   WBC 6.7 01/17/2023   HGB 11.1 (L) 01/17/2023   HCT 34.4 (L) 01/17/2023   MCV 87.5 01/17/2023   PLT 247 01/17/2023      Chemistry      Component Value Date/Time   NA 141 01/17/2023 1552   NA 142 10/07/2022 1128   NA 140 06/17/2017 1315   K 4.0 01/17/2023 1552   K 4.5 06/17/2017 1315   CL 105 01/17/2023 1552   CO2 28 01/17/2023 1552   CO2 28 06/17/2017 1315   BUN 16 01/17/2023 1552   BUN 16 10/07/2022 1128   BUN 18.1 06/17/2017 1315   CREATININE 0.84 01/17/2023 1552   CREATININE 0.8 06/17/2017 1315      Component Value Date/Time   CALCIUM 8.4 (L) 01/17/2023 1552   CALCIUM 9.8 06/17/2017 1315   ALKPHOS 77 01/08/2023 0827   ALKPHOS 82 06/17/2017 1315   AST 11 01/17/2023 1552   AST 12 (L) 01/08/2023 0827   AST 16 06/17/2017 1315   ALT 9 01/17/2023 1552   ALT 9 01/08/2023 0827   ALT 10 06/17/2017 1315   BILITOT 0.5 01/17/2023 1552   BILITOT 0.4 01/08/2023 0827   BILITOT 0.47 06/17/2017 1315       RADIOGRAPHIC STUDIES: NM PET Image Restag (PS) Skull Base To Thigh  Result Date: 01/25/2023 CLINICAL DATA:  Subsequent treatment strategy for lung cancer. Outside hospital [facility]) CT suggested progressive disease with rib invasion. EXAM: NUCLEAR MEDICINE PET SKULL BASE TO THIGH TECHNIQUE: 9.6 mCi F-18 FDG  was injected intravenously. Full-ring PET imaging was performed from the skull base to thigh after the radiotracer. CT data was obtained and used for attenuation correction and anatomic localization. Fasting blood glucose: 110 mg/dl COMPARISON:  Concurrent CT chest dated 01/23/2023.  Prior CT chest dated 06/14/2022. PET-CT dated 05/30/2022. FINDINGS: Mediastinal blood pool activity: SUV max 2.6 Liver activity: SUV max NA NECK: No hypermetabolic cervical lymphadenopathy. Incidental CT findings: None. CHEST: Radiation changes in the left upper lobe/perihilar region, with masslike fibrosis as described on concurrent CT chest. No hypermetabolism to suggest recurrent tumor. No suspicious pulmonary nodules. No hypermetabolic thoracic lymphadenopathy. Small mediastinal nodes, as described on concurrent CT chest, likely reactive. Moderate left pleural effusion, chronic, non FDG avid. Incidental CT findings: Atherosclerotic calcifications of the arch. Moderate coronary atherosclerosis of the LAD and right coronary artery. Status post CABG. ABDOMEN/PELVIS: No abnormal hypermetabolism in the liver, spleen, pancreas, or adrenal glands. No hypermetabolic abdominopelvic lymphadenopathy. Incidental CT findings: Moderate hiatal hernia. Prior cholecystectomy. Atherosclerotic calcifications the abdominal aorta and branch vessels. 2.5 cm bladder calculus. Prostatomegaly. Sigmoid diverticulosis, without evidence of diverticulitis. SKELETON: No focal hypermetabolic activity to suggest skeletal metastasis. Chronic left lateral 3rd rib fracture deformity, possibly related to prior radiation changes, without hypermetabolism. Chronic sclerotic lesion along the right T8 vertebral body (series 4/image 86), without hypermetabolism, benign. Incidental CT findings: Mild degenerative changes of the visualized thoracolumbar spine. Median sternotomy. IMPRESSION: Radiation changes in the left upper lobe/perihilar region. Chronic left lateral 3rd rib  fracture deformity, possibly related to prior radiation changes, without hypermetabolism. No findings suspicious for recurrent or metastatic disease. Electronically Signed   By: Julian Hy M.D.   On: 01/25/2023 00:56   CT Chest Wo Contrast  Result Date: 01/25/2023 CLINICAL DATA:  Non-small cell lung cancer, status post XRT EXAM: CT CHEST WITHOUT CONTRAST TECHNIQUE: Multidetector CT imaging of the chest was performed following the standard protocol without IV contrast. RADIATION DOSE REDUCTION: This exam was performed according to the departmental dose-optimization program which includes automated exposure control, adjustment of the mA and/or kV according to patient size and/or use of iterative reconstruction technique. COMPARISON:  CT chest dated 06/14/2022.  PET-CT dated 05/30/2022. FINDINGS: Cardiovascular: Heart is normal in size. No pericardial effusion. Atrial appendage clip. No evidence of thoracic aortic aneurysm. Atherosclerotic calcifications of the aortic arch. Moderate coronary atherosclerosis of the LAD and right coronary artery. Status post CABG. Mediastinum/Nodes: Small mediastinal nodes including a 10 mm short axis low right paratracheal node with preservation of the normal fatty hilum (series 2/image 61), chronic, likely reactive. Visualized thyroid is unremarkable. Lungs/Pleura: Radiation changes in the left upper lobe/perihilar region with masslike fibrosis and volume loss in the left hemithorax. Moderate left pleural effusion, unchanged. Mild centrilobular and paraseptal emphysematous changes, upper lung predominant. Right apical pleural-parenchymal scarring. No suspicious pulmonary nodules. No pneumothorax. Upper Abdomen: Visualized upper abdomen is grossly unremarkable, noting benign hepatic cysts, prior cholecystectomy, and vascular calcifications. Musculoskeletal: Median sternotomy. Mild degenerative changes of the visualized thoracolumbar spine. Stable sclerotic lesion at T8,  without hypermetabolism on prior PET, likely benign. IMPRESSION: Radiation changes in the left upper lobe/perihilar region, as above. Moderate left pleural effusion, unchanged. No evidence of recurrent or metastatic disease. Aortic Atherosclerosis (ICD10-I70.0) and Emphysema (ICD10-J43.9). Electronically Signed   By: Julian Hy M.D.   On: 01/25/2023 00:49   DG Chest 2 View  Result Date: 01/12/2023 CLINICAL DATA:  Thoracentesis. EXAM: CHEST - 2 VIEW COMPARISON:  Multiple chest x-rays since June 07, 2021 FINDINGS: Stable opacity in left mid lung likely scarring, unchanged. Scarring in the left base noted as well. The cardiomediastinal silhouette is unchanged. No pneumothorax. The right lung is clear. No other acute abnormalities. Stable sclerotic lesion in the posterior aspect of T8. IMPRESSION: 1. No pneumothorax.  2. Stable opacity in the left mid lung likely scarring. 3. Stable sclerotic lesion in the posterior aspect of T8. Electronically Signed   By: Dorise Bullion III M.D.   On: 01/12/2023 16:13    ASSESSMENT AND PLAN: This is a very pleasant 82 years old white male with a stage IIb (T3, N0, M0) non-small cell lung cancer, squamous cell carcinoma presented with left lower lobe endobronchial lesion as well as suspected groundglass opacity in the left upper lobe diagnosed in September 2016.  He is status post SBRT under the care of Dr. Sondra Come and has been in observation since that time. The patient had CT scan of the chest without contrast at [facility] and that showed left upper lobe masslike consolidation with extension into the adjacent pleura with destruction of the adjacent third rib and extending into the left perihilar/hilar region suspicious for malignancy with osseous metastasis. He had repeat CT scan of the chest within the [facility] system that showed no concerning findings for disease recurrence but we also order a PET scan and the finding were not conclusive for any disease  recurrence. I discussed the PET scan results with the patient and recommended for him to continue on observation with repeat CT scan of the chest in 1 year. Regarding the recurrent pleural effusion, he is followed by Dr. Shearon Stalls with pulmonary medicine and they may consider him for Pleurx catheter placement if needed. He was advised to call immediately if he has any other concerning symptoms in the interval. I discussed the assessment and treatment plan with the patient. The patient was provided an opportunity to ask questions and all were answered. The patient agreed with the plan and demonstrated an understanding of the instructions.   The patient was advised to call back or seek an in-person evaluation if the symptoms worsen or if the condition fails to improve as anticipated.  I provided 15 minutes of non face-to-face telephone visit time during this encounter, and > 50% was spent counseling as documented under my assessment & plan.  Eilleen Kempf, MD 01/29/2023 11:50 AM  Disclaimer: This note was dictated with voice recognition software. Similar sounding words can inadvertently be transcribed and may not be corrected upon review.

## 2023-02-03 ENCOUNTER — Encounter: Payer: Self-pay | Admitting: Family

## 2023-02-10 ENCOUNTER — Other Ambulatory Visit (HOSPITAL_BASED_OUTPATIENT_CLINIC_OR_DEPARTMENT_OTHER): Payer: Self-pay

## 2023-02-17 ENCOUNTER — Encounter: Payer: Self-pay | Admitting: *Deleted

## 2023-02-21 ENCOUNTER — Other Ambulatory Visit (HOSPITAL_BASED_OUTPATIENT_CLINIC_OR_DEPARTMENT_OTHER): Payer: Self-pay

## 2023-03-03 ENCOUNTER — Telehealth: Payer: Self-pay

## 2023-03-03 NOTE — Telephone Encounter (Signed)
Initial Comment Caller states he has lots of mucous in his chest then he coughs it up. This has been gone on for about 2 weeks. He is having shortness of breathless. GOTO Facility Not Listed [facility] Translation No Nurse Assessment Nurse: Geryl Rankins, RN, Nof[place] Date/Time (Eastern Time): 03/02/2023 1:17:02 PM Confirm and document reason for call. If symptomatic, describe symptoms. ---Caller states he has lots of mucous in his chest then he coughs it up. This has been gone on for about 2 weeks. He is having shortness of breathless. Caller states he usually gets fluid buildup in chest that gets removed. Does the patient have any new or worsening symptoms? ---Yes Will a triage be completed? ---Yes Related visit to physician within the last 2 weeks? ---No Does the PT have any chronic conditions? (i.e. diabetes, asthma, this includes High risk factors for pregnancy, etc.) ---Yes Is this a behavioral health or substance abuse call? ---No Guidelines Guideline Title Affirmed Question Affirmed Notes Nurse Date/Time (Eastern Time) Breathing Difficulty SEVERE difficulty breathing (e.g., struggling for each breath, speaks in single words) Kadour, RN, Nofa 03/02/2023 1:18:48 PM PLEASE NOTE: All timestamps contained within this report are represented as [place] Standard Time. CONFIDENTIALTY NOTICE: This fax transmission is intended only for the addressee. It contains information that is legally privileged, confidential or otherwise protected from use or disclosure. If you are not the intended recipient, you are strictly prohibited from reviewing, disclosing, copying using or disseminating any of this information or taking any action in reliance on or regarding this information. If you have received this fax in error, please notify us immediately by telephone so that we can arrange for its return to [place]. Phone: [phone], Toll-Free: [phone], Fax: [phone] Page: 2 of 2 Call Id:  [id number] Disp. Time Lamount Cohen Time) Disposition Final User 03/02/2023 1:22:33 PM Call EMS 911 Now Yes Kadour, RN, Nofa 03/02/2023 1:24:11 PM 911 Outcome Documentation Kadour, RN, Nofa Reason: Pt refusing to call 911. States his family will be coming in around 20 minutes and they will take him to hospital. Advised pt to call 911 again, and advised him to call if symptoms worsen as well. Final Disposition 03/02/2023 1:22:33 PM Call EMS 911 Now Yes Kadour, RN, Nofa Caller Disagree/Comply Comply Caller Understands Yes PreDisposition InappropriateToAsk Care Advice Given Per Guideline CALL EMS 911 NOW: * Immediate medical attention is needed. You need to hang up and call 911 (or an ambulance). CARE ADVICE given per Breathing Difficulty (Adult) guideline. Referrals GO TO FACILITY UNDECIDED GO TO FACILITY OTHER - SPECIFY

## 2023-03-03 NOTE — Telephone Encounter (Signed)
Called patient to see if he got in contact with pulmonology or if he has gone to either ed or urgent care. He did not answer the telephone.  Lvm for him to call back if he is feeling stable he may be able to schedule an appointment for tomorrow am with [facility] as long as he is not having shortness of breath in which case we still advise to go to ED.

## 2023-03-03 NOTE — Telephone Encounter (Signed)
Patient called stating he does not want to go to the ED, but will come in for an office visit. Informed patient that we are full in this office but another office could see him. Patient asked if he should go see his lung doctor instead. Patient did not know the name or number of the pulmonologist he sees. Please advise.

## 2023-03-04 ENCOUNTER — Ambulatory Visit (HOSPITAL_BASED_OUTPATIENT_CLINIC_OR_DEPARTMENT_OTHER)
Admission: RE | Admit: 2023-03-04 | Discharge: 2023-03-04 | Disposition: A | Discharge status: Home / Self Care | Payer: Medicare Other | Source: Ambulatory Visit | Attending: Family | Admitting: Family

## 2023-03-04 ENCOUNTER — Other Ambulatory Visit (HOSPITAL_BASED_OUTPATIENT_CLINIC_OR_DEPARTMENT_OTHER): Payer: Self-pay

## 2023-03-04 ENCOUNTER — Ambulatory Visit (INDEPENDENT_AMBULATORY_CARE_PROVIDER_SITE_OTHER): Payer: Medicare Other | Admitting: Family

## 2023-03-04 VITALS — BP 109/57 | HR 58 | Temp 97.7°F | Resp 16 | Wt 181.0 lb

## 2023-03-04 DIAGNOSIS — J209 Acute bronchitis, unspecified: Secondary | ICD-10-CM

## 2023-03-04 DIAGNOSIS — R059 Cough, unspecified: Secondary | ICD-10-CM | POA: Diagnosis not present

## 2023-03-04 DIAGNOSIS — J90 Pleural effusion, not elsewhere classified: Secondary | ICD-10-CM | POA: Diagnosis not present

## 2023-03-04 MED ORDER — BENZONATATE 100 MG PO CAPS
100.0000 mg | ORAL_CAPSULE | Freq: Three times a day (TID) | ORAL | 0 refills | Status: DC | PRN
  Filled 2023-03-04: qty 20, 7d supply, fill #0

## 2023-03-04 MED ORDER — AMOXICILLIN-POT CLAVULANATE 875-125 MG PO TABS
1.0000 | ORAL_TABLET | Freq: Two times a day (BID) | ORAL | 0 refills | Status: DC
  Filled 2023-03-04: qty 6, 3d supply, fill #0
  Filled 2023-03-04: qty 14, 7d supply, fill #0

## 2023-03-04 MED ORDER — PREDNISONE 10 MG PO TABS
ORAL_TABLET | ORAL | 0 refills | Status: AC
  Filled 2023-03-04: qty 20, 8d supply, fill #0

## 2023-03-04 NOTE — Telephone Encounter (Signed)
Called patient and scheduled to come in today at 10:20

## 2023-03-04 NOTE — Progress Notes (Signed)
Subjective:   By signing my name below, I, Carlena Bjornstad, attest that this documentation has been prepared under the direction and in the presence of Sandford Craze, NP.  03/04/2023.   Patient ID: Robert Bates, male    DOB: 09-24-1941, 82 y.o.   MRN: [id number]  Chief Complaint  Patient presents with   Cough    Patient complains of productive cough    HPI Patient is in today for an office visit. He is accompanied by a family member.  Congestion/Cough:  For the past 2 weeks he has suffered with a significant cough and difficulty breathing. He has been using his albuterol inhaler without much improvement. In the last few days he developed some rhinorrhea as well. Additionally he complains of associated chest congestion, sputum production, and pleuritic chest pain bilaterally. He states that his symptoms have gradually worsened since last Friday. He denies any fever. He tested negative for Covid-19 this past Thursday.   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   COPD (chronic obstructive pulmonary disease) (HCC)    Coronary artery disease    Diabetes type 2, controlled (HCC) 07/31/2017   Dyspnea    Dysrhythmia    a fib   GERD (gastroesophageal reflux disease)    Hematuria    refuses work up or referral - understands risks of morbidity / mortality - 11/2008, 12/2008   Heme positive stool    History of hiatal hernia    History of kidney stones    Hyperlipemia    Meningioma (HCC) 10/25/2013   Follows with Dr. Coletta Memos.    Peripheral vascular disease (HCC)    Abdominal Aortic Aneursym   Pneumonia    as a child   Radiation 09/18/15-10/25/15   left lower lobe 70.2 Gy   Seizures (HCC) 02/18/2020   Tobacco abuse      Past Surgical History:  Procedure Laterality Date   CHOLECYSTECTOMY N/A 07/23/2017   Procedure: LAPAROSCOPIC CHOLECYSTECTOMY;  Surgeon: Rodman Pickle, MD;  Location: [facility];  Service: General;  Laterality: N/A;   CLIPPING OF ATRIAL APPENDAGE Left 05/08/2021   Procedure: CLIPPING OF ATRIAL APPENDAGE USING 45 ATRICLIP;  Surgeon: Linden Dolin, MD;  Location: [facility];  Service: Open Heart Surgery;  Laterality: Left;   COLONOSCOPY     CORONARY ARTERY BYPASS GRAFT N/A 05/08/2021   Procedure: CORONARY ARTERY BYPASS GRAFTING (CABG)X 3 USING LEFT INTERNAL MAMMARY ARTERY AND RIGHT GREATER SAPEHNOUS VEIN;  Surgeon: Linden Dolin, MD;  Location: [facility];  Service: Open Heart Surgery;  Laterality: N/A;   ENDOVEIN HARVEST OF GREATER SAPHENOUS VEIN Right 05/08/2021   Procedure: ENDOVEIN HARVEST OF GREATER SAPHENOUS VEIN;  Surgeon: Linden Dolin, MD;  Location: [facility];  Service: Open Heart Surgery;  Laterality: Right;   EYE SURGERY Bilateral    Cataracts removed w/ lens implant   HERNIA REPAIR     Left 36 years ago . Right inguinal hernia repair 10-01-17 Dr. Sheliah Hatch   INGUINAL HERNIA REPAIR Right 10/01/2017   Procedure: RIGHT INGUINAL HERNIA REPAIR WITH MESH;  Surgeon: Kinsinger, De Blanch, MD;  Location: [facility];  Service: General;  Laterality: Right;  TAP BLOCK   INSERTION OF MESH Right 10/01/2017  Procedure: INSERTION OF MESH;  Surgeon: Kinsinger, De Blanch, MD;  Location: [facility];  Service: General;  Laterality: Right;   IR THORACENTESIS ASP PLEURAL SPACE W/IMG GUIDE  05/18/2021   IR THORACENTESIS ASP PLEURAL SPACE W/IMG GUIDE  06/07/2021   LEFT HEART CATH AND CORONARY ANGIOGRAPHY N/A 04/20/2021   Procedure: LEFT HEART CATH AND CORONARY ANGIOGRAPHY;  Surgeon: Iran Ouch, MD;  [facility]: [facility];  Service: Cardiovascular;  Laterality: N/A;   TEE WITHOUT CARDIOVERSION N/A 05/08/2021   Procedure: TRANSESOPHAGEAL ECHOCARDIOGRAM (TEE);  Surgeon: Linden Dolin, MD;  Location:  [facility] OR;  Service: Open Heart Surgery;  Laterality: N/A;   TONSILLECTOMY     TONSILLECTOMY     VIDEO BRONCHOSCOPY Bilateral 07/26/2015   Procedure: VIDEO BRONCHOSCOPY WITH FLUORO;  Surgeon: Nyoka Cowden, MD;  Location: [facility];  Service: Cardiopulmonary;  Laterality: Bilateral;   VIDEO BRONCHOSCOPY WITH ENDOBRONCHIAL NAVIGATION N/A 08/23/2015   Procedure: VIDEO BRONCHOSCOPY WITH ENDOBRONCHIAL NAVIGATION;  Surgeon: Delight Ovens, MD;  Location: [facility];  Service: Thoracic;  Laterality: N/A;   VIDEO BRONCHOSCOPY WITH ENDOBRONCHIAL ULTRASOUND N/A 08/23/2015   Procedure: VIDEO BRONCHOSCOPY WITH ENDOBRONCHIAL ULTRASOUND;  Surgeon: Delight Ovens, MD;  Location: [facility];  Service: Thoracic;  Laterality: N/A;    Family History  Problem Relation Age of Onset   Leukemia Father    Emphysema Father    Learning disabilities Son    Atrial fibrillation Son    Leukemia Other    Stroke Other     Social History   Socioeconomic History   Marital status: Widowed    Spouse name: Not on file   Number of children: 2   Years of education: Not on file   Highest education level: Not on file  Occupational History   Occupation: Retired    Associate Professor: [employer]    Comment: truck Air traffic controller: [employer]  Tobacco Use   Smoking status: Former    Packs/day: 1.00    Years: 57.00    Additional pack years: 0.00    Total pack years: 57.00    Types: Cigarettes, Cigars    Quit date: 08/08/2015    Years since quitting: 7.5   Smokeless tobacco: Former    Types: Chew    Quit date: 11/04/1958   Tobacco comments:    Will smoke cigar every once in awhile.  Vaping daily started a couple weeks ago.  04/18/22 hfb  Vaping Use   Vaping Use: Former  Substance and Sexual Activity   Alcohol use: Not Currently    Alcohol/week: 0.0 standard drinks of alcohol   Drug use: No   Sexual activity: Not Currently  Other Topics Concern   Not on file  Social History Narrative   Not on file   Social  Determinants of Health   Financial Resource Strain: Medium Risk (01/03/2023)   Overall Financial Resource Strain (CARDIA)    Difficulty of Paying Living Expenses: Somewhat hard  Food Insecurity: Food Insecurity Present (01/03/2023)   Hunger Vital Sign    Worried About Running Out of Food in the Last Year: Sometimes true    Ran Out of Food in the Last Year: Sometimes true  Transportation Needs: No Transportation Needs (05/13/2022)   PRAPARE - Administrator, Civil Service (Medical): No    Lack of Transportation (Non-Medical): No  Physical Activity: Insufficiently Active (08/05/2021)   Exercise Vital Sign    Days of Exercise per Week: 2 days  Minutes of Exercise per Session: 40 min  Stress: No Stress Concern Present (10/01/2021)   [employer] of Occupational Health - Occupational Stress Questionnaire    Feeling of Stress : Not at all  Social Connections: Moderately Isolated (05/13/2022)   Social Connection and Isolation Panel [NHANES]    Frequency of Communication with Friends and Family: Twice a week    Frequency of Social Gatherings with Friends and Family: Once a week    Attends Religious Services: Never    Database administrator or Organizations: No    Attends Banker Meetings: Never    Marital Status: Married  Catering manager Violence: Not At Risk (10/02/2022)   Humiliation, Afraid, Rape, and Kick questionnaire    Fear of Current or Ex-Partner: No    Emotionally Abused: No    Physically Abused: No    Sexually Abused: No    Outpatient Medications Prior to Visit  Medication Sig Dispense Refill   acetaminophen (TYLENOL) 500 MG tablet Take 1-2 tablets (500-1,000 mg total) by mouth every 6 (six) hours as needed. 30 tablet 0   albuterol (PROVENTIL) (2.5 MG/3ML) 0.083% nebulizer solution Take 3 mLs (2.5 mg total) by nebulization every 6 (six) hours as needed for wheezing or shortness of breath. 75 mL 12   albuterol (VENTOLIN HFA) 108 (90 Base) MCG/ACT  inhaler Inhale 2 puffs into the lungs every 6 (six) hours as needed for wheezing or shortness of breath. 8.5 g 5   amiodarone (PACERONE) 200 MG tablet Take 1/2 tablet (100mg ) by mouth three days per week (M,W,F) 6 tablet 3   atorvastatin (LIPITOR) 80 MG tablet Take 1 tablet (80 mg total) by mouth daily. 90 tablet 2   Budeson-Glycopyrrol-Formoterol (BREZTRI AEROSPHERE) 160-9-4.8 MCG/ACT AERO Inhale 2 puffs into the lungs in the morning and at bedtime. 10.7 g 5   dapagliflozin propanediol (FARXIGA) 5 MG TABS tablet Take 1 tablet (5 mg total) by mouth daily before breakfast. 90 tablet 2   diltiazem (CARDIZEM CD) 120 MG 24 hr capsule Take by mouth.     fluticasone (FLONASE) 50 MCG/ACT nasal spray Place 2 sprays into both nostrils daily. 16 g 1   furosemide (LASIX) 40 MG tablet Take 1 tablet (40 mg total) by mouth daily. 90 tablet 0   gabapentin (NEURONTIN) 300 MG capsule Take 1 capsule (300 mg total) by mouth 3 (three) times daily. 90 capsule 5   HYDROcodone-acetaminophen (NORCO/VICODIN) 5-325 MG tablet Take 1 tablet by mouth every 6 (six) hours as needed for pain for up to 5 days. Max of 4 tablets daily. 20 tablet 0   levETIRAcetam (KEPPRA XR) 750 MG 24 hr tablet Take 1 tablet (750 mg total) by mouth at bedtime. 30 tablet 11   Multiple Vitamin (MULTIVITAMIN WITH MINERALS) TABS tablet Take 1 tablet by mouth daily in the afternoon.     omeprazole (PRILOSEC) 40 MG capsule Take 1 capsule (40 mg total) by mouth daily. 90 capsule 1   potassium chloride (KLOR-CON) 10 MEQ tablet TAKE 2 TABLETS BY MOUTH DAILY FOR 2 DAYS THEN REDUCE TO 1 TABLET BY MOUTH DAILY (Patient taking differently: 10 mEq daily.) 92 tablet 3   rivaroxaban (XARELTO) 20 MG TABS tablet Take 1 tablet (20 mg total) by mouth every evening with supper. 90 tablet 1   sitaGLIPtin (JANUVIA) 50 MG tablet Take 1 tablet (50 mg total) by mouth daily. 90 tablet 1   tamsulosin (FLOMAX) 0.4 MG CAPS capsule Take 1 capsule (0.4 mg total) by mouth daily.  90  capsule 1   methylPREDNISolone (MEDROL DOSEPAK) 4 MG TBPK tablet Please take per package instructions 21 tablet 0   No facility-administered medications prior to visit.    Allergies  Allergen Reactions   Iodine Other (See Comments)    neck swells   Iohexol Swelling    Neck and gland swelling per patient.   Metformin And Related Diarrhea    diarrhea    Review of Systems  Constitutional:  Negative for fever.  HENT:  Positive for congestion.   Respiratory:  Positive for cough, sputum production, shortness of breath and wheezing.   Cardiovascular:  Positive for chest pain (Pleuritic, bilaterally).    See HPI.      Objective:    Physical Exam Constitutional:      General: He is not in acute distress.    Appearance: Normal appearance. He is not ill-appearing.  HENT:     Head: Normocephalic and atraumatic.     Right Ear: Tympanic membrane, ear canal and external ear normal.     Left Ear: Tympanic membrane, ear canal and external ear normal.  Eyes:     Extraocular Movements: Extraocular movements intact.     Pupils: Pupils are equal, round, and reactive to light.  Cardiovascular:     Rate and Rhythm: Normal rate and regular rhythm.     Heart sounds: Normal heart sounds. No murmur heard.    No gallop.  Pulmonary:     Effort: Pulmonary effort is normal. No respiratory distress.     Breath sounds: Wheezing present. No rales.     Comments: Left sided inspiratory and expiratory wheeze. Skin:    General: Skin is warm and dry.  Neurological:     General: No focal deficit present.     Mental Status: He is alert and oriented to person, place, and time.  Psychiatric:        Mood and Affect: Mood normal.        Behavior: Behavior normal.     BP (!) 109/57 (BP Location: Right Arm, Patient Position: Sitting, Cuff Size: Small)   Pulse (!) 58   Temp 97.7 F (36.5 C) (Oral)   Resp 16   Wt 181 lb (82.1 kg)   SpO2 97%   BMI 26.73 kg/m  Wt Readings from Last 3 Encounters:   03/04/23 181 lb (82.1 kg)  01/17/23 184 lb (83.5 kg)  01/10/23 184 lb 6.4 oz (83.6 kg)       Assessment & Plan:   Problem List Items Addressed This Visit       Unprioritized   Bronchitis with bronchospasm - Primary    New. Will plan empiric rx with augmentin. Will also rx with prednisone taper/prn tessalon. Continue albuterol. Patient is advised to complete cxr on the first floor. He has hx of LUL lung cancer s/p radiation.  Had PET scan recently which did not show local recurrence.       Relevant Medications   predniSONE (DELTASONE) 10 MG tablet   benzonatate (TESSALON) 100 MG capsule   Other Relevant Orders   DG Chest 2 View     Meds ordered this encounter  Medications   predniSONE (DELTASONE) 10 MG tablet    Sig: Take 4 tablets (40 mg total) by mouth daily for 2 days, THEN 3 tablets (30 mg total) daily for 2 days, THEN 2 tablets (20 mg total) daily for 2 days, THEN 1 tablet (10 mg total) daily for 2 days.    Dispense:  20  tablet    Refill:  0    Order Specific Question:   Supervising Provider    Answer:   Danise Edge A [[id number]]   benzonatate (TESSALON) 100 MG capsule    Sig: Take 1 capsule (100 mg total) by mouth 3 (three) times daily as needed.    Dispense:  20 capsule    Refill:  0    Order Specific Question:   Supervising Provider    Answer:   Danise Edge A [[id number]]   amoxicillin-clavulanate (AUGMENTIN) 875-125 MG tablet    Sig: Take 1 tablet by mouth 2 (two) times daily.    Dispense:  14 tablet    Refill:  0    Order Specific Question:   Supervising Provider    Answer:   Danise Edge A [[id number]]    I, Lemont Fillers, NP, personally preformed the services described in this documentation.  All medical record entries made by the scribe were at my direction and in my presence.  I have reviewed the chart and discharge instructions (if applicable) and agree that the record reflects my personal performance and is accurate and complete. 03/04/2023.  I,Mathew  Stumpf,acting as a Neurosurgeon for [employer], NP.,have documented all relevant documentation on the behalf of Lemont Fillers, NP,as directed by  Lemont Fillers, NP while in the presence of Lemont Fillers, NP.   Lemont Fillers, NP

## 2023-03-04 NOTE — Assessment & Plan Note (Addendum)
New. Will plan empiric rx with augmentin. Will also rx with prednisone taper/prn tessalon. Continue albuterol. Patient is advised to complete cxr on the first floor. He has hx of LUL lung cancer s/p radiation.  Had PET scan recently which did not show local recurrence.

## 2023-03-10 ENCOUNTER — Other Ambulatory Visit: Payer: Self-pay | Admitting: Internal Medicine

## 2023-03-10 ENCOUNTER — Other Ambulatory Visit (HOSPITAL_BASED_OUTPATIENT_CLINIC_OR_DEPARTMENT_OTHER): Payer: Self-pay

## 2023-03-10 MED ORDER — FUROSEMIDE 40 MG PO TABS
40.0000 mg | ORAL_TABLET | Freq: Every day | ORAL | 0 refills | Status: DC
  Filled 2023-03-10 (×2): qty 90, 90d supply, fill #0
  Filled 2023-05-20: qty 3, 3d supply, fill #1

## 2023-03-11 ENCOUNTER — Other Ambulatory Visit (HOSPITAL_BASED_OUTPATIENT_CLINIC_OR_DEPARTMENT_OTHER): Payer: Self-pay

## 2023-03-13 ENCOUNTER — Telehealth: Payer: Self-pay | Admitting: Family

## 2023-03-13 NOTE — Telephone Encounter (Signed)
Pt stated the last time he was here he forgot to ask about his imaging results. Also stated pcp has given him a red cough syrup in the past that helped him, that he would like again. Please advise.    [facility] - [facility] [address], [address], [place] [address] Phone: [phone]  Fax: [phone]

## 2023-03-14 NOTE — Telephone Encounter (Signed)
Called patient but no answer, left voice mail for patient to call back.   

## 2023-03-19 ENCOUNTER — Other Ambulatory Visit (HOSPITAL_BASED_OUTPATIENT_CLINIC_OR_DEPARTMENT_OTHER): Payer: Self-pay | Admitting: Neurosurgery

## 2023-03-19 DIAGNOSIS — D329 Benign neoplasm of meninges, unspecified: Secondary | ICD-10-CM

## 2023-03-21 ENCOUNTER — Ambulatory Visit: Payer: Medicare Other | Admitting: Physician Assistant

## 2023-03-21 ENCOUNTER — Ambulatory Visit: Payer: Medicare Other | Admitting: Family

## 2023-03-24 ENCOUNTER — Telehealth: Payer: Self-pay | Admitting: Family

## 2023-03-24 ENCOUNTER — Ambulatory Visit (INDEPENDENT_AMBULATORY_CARE_PROVIDER_SITE_OTHER): Payer: Medicare Other | Admitting: Family

## 2023-03-24 ENCOUNTER — Ambulatory Visit: Payer: Medicare Other | Admitting: Family

## 2023-03-24 ENCOUNTER — Ambulatory Visit (HOSPITAL_BASED_OUTPATIENT_CLINIC_OR_DEPARTMENT_OTHER)
Admission: RE | Admit: 2023-03-24 | Discharge: 2023-03-24 | Disposition: A | Discharge status: Home / Self Care | Payer: Medicare Other | Source: Ambulatory Visit | Attending: Family | Admitting: Family

## 2023-03-24 ENCOUNTER — Other Ambulatory Visit (HOSPITAL_BASED_OUTPATIENT_CLINIC_OR_DEPARTMENT_OTHER): Payer: Self-pay

## 2023-03-24 ENCOUNTER — Telehealth: Payer: Self-pay | Admitting: Internal Medicine

## 2023-03-24 VITALS — BP 105/65 | HR 87 | Temp 98.1°F | Resp 16 | Wt 181.0 lb

## 2023-03-24 DIAGNOSIS — J90 Pleural effusion, not elsewhere classified: Secondary | ICD-10-CM | POA: Diagnosis not present

## 2023-03-24 DIAGNOSIS — N471 Phimosis: Secondary | ICD-10-CM | POA: Diagnosis not present

## 2023-03-24 DIAGNOSIS — R079 Chest pain, unspecified: Secondary | ICD-10-CM

## 2023-03-24 LAB — COMPREHENSIVE METABOLIC PANEL
ALT: 10 U/L (ref 0–53)
AST: 10 U/L (ref 0–37)
Albumin: 3.5 g/dL (ref 3.5–5.2)
Alkaline Phosphatase: 95 U/L (ref 39–117)
BUN: 16 mg/dL (ref 6–23)
CO2: 28 mEq/L (ref 19–32)
Calcium: 8.9 mg/dL (ref 8.4–10.5)
Chloride: 103 mEq/L (ref 96–112)
Creatinine, Ser: 0.86 mg/dL (ref 0.40–1.50)
GFR: 81.06 mL/min (ref >60.00)
Glucose, Bld: 106 mg/dL — ABNORMAL HIGH (ref 70–99)
Potassium: 4 mEq/L (ref 3.5–5.1)
Sodium: 141 mEq/L (ref 135–145)
Total Bilirubin: 0.6 mg/dL (ref 0.2–1.2)
Total Protein: 5.8 g/dL — ABNORMAL LOW (ref 6.0–8.3)

## 2023-03-24 LAB — CBC WITH DIFFERENTIAL/PLATELET
Basophils Absolute: 0 10*3/uL (ref 0.0–0.1)
Basophils Relative: 0.2 % (ref 0.0–3.0)
Eosinophils Absolute: 0.1 10*3/uL (ref 0.0–0.7)
Eosinophils Relative: 0.7 % (ref 0.0–5.0)
HCT: 37.9 % — ABNORMAL LOW (ref 39.0–52.0)
Hemoglobin: 12.2 g/dL — ABNORMAL LOW (ref 13.0–17.0)
Lymphocytes Relative: 9.8 % — ABNORMAL LOW (ref 12.0–46.0)
Lymphs Abs: 1 10*3/uL (ref 0.7–4.0)
MCHC: 32.2 g/dL (ref 30.0–36.0)
MCV: 85.5 fl (ref 78.0–100.0)
Monocytes Absolute: 1.1 10*3/uL — ABNORMAL HIGH (ref 0.1–1.0)
Monocytes Relative: 11 % (ref 3.0–12.0)
Neutro Abs: 7.9 10*3/uL — ABNORMAL HIGH (ref 1.4–7.7)
Neutrophils Relative %: 78.3 % — ABNORMAL HIGH (ref 43.0–77.0)
Platelets: 261 10*3/uL (ref 150.0–400.0)
RBC: 4.43 Mil/uL (ref 4.22–5.81)
RDW: 16.3 % — ABNORMAL HIGH (ref 11.5–15.5)
WBC: 10.1 10*3/uL (ref 4.0–10.5)

## 2023-03-24 MED ORDER — TRAMADOL HCL 50 MG PO TABS
50.0000 mg | ORAL_TABLET | Freq: Three times a day (TID) | ORAL | 0 refills | Status: AC | PRN
  Filled 2023-03-24: qty 15, 5d supply, fill #0

## 2023-03-24 MED ORDER — DOXYCYCLINE HYCLATE 100 MG PO TABS
100.0000 mg | ORAL_TABLET | Freq: Two times a day (BID) | ORAL | 0 refills | Status: DC
  Filled 2023-03-24: qty 7, 4d supply, fill #0

## 2023-03-24 NOTE — Assessment & Plan Note (Signed)
New.  Will refer to Urology.

## 2023-03-24 NOTE — Telephone Encounter (Signed)
Patient is scheduled 03/26/2023 at 1:15pm- patient is aware.

## 2023-03-24 NOTE — Telephone Encounter (Signed)
Please call patient and schedule him for acute visit with me this week to discuss how best to manage this fluid. He has had this fluid drained multiple times now and I think he would benefit from pleurx catheter placement which is something that can give him more permanent relief.

## 2023-03-24 NOTE — Telephone Encounter (Signed)
Called patient but no answer, left voice mail for patient to be aware of prescription and to call back.

## 2023-03-24 NOTE — Assessment & Plan Note (Addendum)
I suspect that this is pleuritic chest pain and possibly due to worsening L pleural effusion.  Will obtain a stat CXR for further evaluation. EKG tracing is personally reviewed.  EKG notes NSR.  No acute changes.   Further recommendations pending review of CXR results.

## 2023-03-24 NOTE — Telephone Encounter (Signed)
-----   Message from Sandford Craze, NP sent at 03/24/2023  1:21 PM EDT ----- Dear Dr. Celine Mans,  I saw Mr. Pasquariello today.  He is having increased SOB/pleuritic chest pain.  X-ray notes Stable moderate left pleural effusion with left lung base atelectasis/consolidation. I am planning to start him on empiric abx, but I was hoping that you or one of your colleagues could please try to work him in early this week.  Thanks,  [employer]

## 2023-03-24 NOTE — Telephone Encounter (Signed)
Please advise pt that I reviewed his x-ray.  It does show some increased fluid as well as possible pneumonia.    I would like for him to begin antibiotics. Rx sent downstairs for doxycycline.  I also sent an rx for tramadol that he can use short term for pain.  Do not drive after taking.  Also I will refer him back to his pulmonary doctor.  I sent Dr. Celine Mans a message to see if they can get him in tor an appointment with them early this week.   If increased pain or shortness of breath, he should go to the ER.

## 2023-03-24 NOTE — Progress Notes (Addendum)
Subjective:   By signing my name below, I, Robert Bates, attest that this documentation has been prepared under the direction and in the presence of Sandford Craze, NP. 03/24/2023   Patient ID: Robert Bates, male    DOB: 05-30-1941, 82 y.o.   MRN: [id number]  Chief Complaint  Patient presents with   Abdominal Pain    Left upper abdominal pain   Shoulder Pain    Left shoulder pain   Headache    Complains of pain on left side of head   Phimosis    Patient complains of foreskin tightness over head of penis, "hard to pull back".     Abdominal Pain Associated symptoms include frequency and headaches.  Shoulder Pain   Headache  Associated symptoms include abdominal pain.   Patient is in today for a office visit.   Abdominal pain: He complains of generalized abdominal pain. He has difficulty breathing due to his pain. He also has left sided chest pain.  Headaches: He also complains of headaches.   Shoulder pain: He also complains of shoulder pain.   Penis: He complains of foreskin tightness. He has difficulty pulling his foreskin back. He has soreness and burning pain. He also has white pus occasionally developing around his foreskin.   Lasix: He is not taking 40 mg Lasix daily PO due to urinary frequency. He is willing to start taking it regularly to help manage his other symptoms.    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   COPD (chronic obstructive pulmonary disease) (HCC)    Coronary artery disease    Diabetes type 2, controlled (HCC) 07/31/2017   Dyspnea    Dysrhythmia    a fib   GERD (gastroesophageal reflux disease)    Hematuria    refuses work up or referral - understands risks of morbidity /  mortality - 11/2008, 12/2008   Heme positive stool    History of hiatal hernia    History of kidney stones    Hyperlipemia    Meningioma (HCC) 10/25/2013   Follows with Dr. Coletta Memos.    Peripheral vascular disease (HCC)    Abdominal Aortic Aneursym   Pneumonia    as a child   Radiation 09/18/15-10/25/15   left lower lobe 70.2 Gy   Seizures (HCC) 02/18/2020   Tobacco abuse     Past Surgical History:  Procedure Laterality Date   CHOLECYSTECTOMY N/A 07/23/2017   Procedure: LAPAROSCOPIC CHOLECYSTECTOMY;  Surgeon: Rodman Pickle, MD;  Location: [facility];  Service: General;  Laterality: N/A;   CLIPPING OF ATRIAL APPENDAGE Left 05/08/2021   Procedure: CLIPPING OF ATRIAL APPENDAGE USING 45 ATRICLIP;  Surgeon: Linden Dolin, MD;  Location: [facility] OR;  Service: Open Heart Surgery;  Laterality: Left;   COLONOSCOPY     CORONARY ARTERY BYPASS GRAFT N/A 05/08/2021   Procedure: CORONARY ARTERY BYPASS GRAFTING (CABG)X 3 USING LEFT INTERNAL MAMMARY ARTERY AND RIGHT GREATER SAPEHNOUS VEIN;  Surgeon: Linden Dolin, MD;  Location: [facility] OR;  Service: Open Heart Surgery;  Laterality: N/A;   ENDOVEIN HARVEST OF GREATER SAPHENOUS VEIN Right 05/08/2021   Procedure: ENDOVEIN HARVEST OF GREATER SAPHENOUS VEIN;  Surgeon: Linden Dolin, MD;  Location: [facility]  OR;  Service: Open Heart Surgery;  Laterality: Right;   EYE SURGERY Bilateral    Cataracts removed w/ lens implant   HERNIA REPAIR     Left 36 years ago . Right inguinal hernia repair 10-01-17 Dr. Sheliah Hatch   INGUINAL HERNIA REPAIR Right 10/01/2017   Procedure: RIGHT INGUINAL HERNIA REPAIR WITH MESH;  Surgeon: Kinsinger, De Blanch, MD;  Location: [facility];  Service: General;  Laterality: Right;  TAP BLOCK   INSERTION OF MESH Right 10/01/2017   Procedure: INSERTION OF MESH;  Surgeon: Sheliah Hatch De Blanch, MD;  Location: [facility];  Service: General;  Laterality: Right;   IR THORACENTESIS ASP PLEURAL SPACE W/IMG GUIDE  05/18/2021   IR THORACENTESIS ASP  PLEURAL SPACE W/IMG GUIDE  06/07/2021   LEFT HEART CATH AND CORONARY ANGIOGRAPHY N/A 04/20/2021   Procedure: LEFT HEART CATH AND CORONARY ANGIOGRAPHY;  Surgeon: Iran Ouch, MD;  [facility]: [facility];  Service: Cardiovascular;  Laterality: N/A;   TEE WITHOUT CARDIOVERSION N/A 05/08/2021   Procedure: TRANSESOPHAGEAL ECHOCARDIOGRAM (TEE);  Surgeon: Linden Dolin, MD;  Location: [facility] OR;  Service: Open Heart Surgery;  Laterality: N/A;   TONSILLECTOMY     TONSILLECTOMY     VIDEO BRONCHOSCOPY Bilateral 07/26/2015   Procedure: VIDEO BRONCHOSCOPY WITH FLUORO;  Surgeon: Nyoka Cowden, MD;  Location: [facility];  Service: Cardiopulmonary;  Laterality: Bilateral;   VIDEO BRONCHOSCOPY WITH ENDOBRONCHIAL NAVIGATION N/A 08/23/2015   Procedure: VIDEO BRONCHOSCOPY WITH ENDOBRONCHIAL NAVIGATION;  Surgeon: Delight Ovens, MD;  Location: [facility];  Service: Thoracic;  Laterality: N/A;   VIDEO BRONCHOSCOPY WITH ENDOBRONCHIAL ULTRASOUND N/A 08/23/2015   Procedure: VIDEO BRONCHOSCOPY WITH ENDOBRONCHIAL ULTRASOUND;  Surgeon: Delight Ovens, MD;  Location: [facility] OR;  Service: Thoracic;  Laterality: N/A;    Family History  Problem Relation Age of Onset   Leukemia Father    Emphysema Father    Learning disabilities Son    Atrial fibrillation Son    Leukemia Other    Stroke Other     Social History   Socioeconomic History   Marital status: Widowed    Spouse name: Not on file   Number of children: 2   Years of education: Not on file   Highest education level: Not on file  Occupational History   Occupation: Retired    Associate Professor: [employer]    Comment: truck Air traffic controller: [employer]  Tobacco Use   Smoking status: Former    Packs/day: 1.00    Years: 57.00    Additional pack years: 0.00    Total pack years: 57.00    Types: Cigarettes, Cigars    Quit date: 08/08/2015    Years since quitting: 7.6   Smokeless tobacco: Former    Types: Chew    Quit date: 11/04/1958   Tobacco  comments:    Will smoke cigar every once in awhile.  Vaping daily started a couple weeks ago.  04/18/22 hfb  Vaping Use   Vaping Use: Former  Substance and Sexual Activity   Alcohol use: Not Currently    Alcohol/week: 0.0 standard drinks of alcohol   Drug use: No   Sexual activity: Not Currently  Other Topics Concern   Not on file  Social History Narrative   Not on file   Social Determinants of Health   Financial Resource Strain: Medium Risk (01/03/2023)   Overall Financial Resource Strain (CARDIA)    Difficulty of Paying Living Expenses: Somewhat hard  Food Insecurity: Food Insecurity Present (  01/03/2023)   Hunger Vital Sign    Worried About Running Out of Food in the Last Year: Sometimes true    Ran Out of Food in the Last Year: Sometimes true  Transportation Needs: No Transportation Needs (05/13/2022)   PRAPARE - Administrator, Civil Service (Medical): No    Lack of Transportation (Non-Medical): No  Physical Activity: Insufficiently Active (08/05/2021)   Exercise Vital Sign    Days of Exercise per Week: 2 days    Minutes of Exercise per Session: 40 min  Stress: No Stress Concern Present (10/01/2021)   [employer] of Occupational Health - Occupational Stress Questionnaire    Feeling of Stress : Not at all  Social Connections: Moderately Isolated (05/13/2022)   Social Connection and Isolation Panel [NHANES]    Frequency of Communication with Friends and Family: Twice a week    Frequency of Social Gatherings with Friends and Family: Once a week    Attends Religious Services: Never    Database administrator or Organizations: No    Attends Banker Meetings: Never    Marital Status: Married  Catering manager Violence: Not At Risk (10/02/2022)   Humiliation, Afraid, Rape, and Kick questionnaire    Fear of Current or Ex-Partner: No    Emotionally Abused: No    Physically Abused: No    Sexually Abused: No    Outpatient Medications Prior to Visit   Medication Sig Dispense Refill   acetaminophen (TYLENOL) 500 MG tablet Take 1-2 tablets (500-1,000 mg total) by mouth every 6 (six) hours as needed. 30 tablet 0   albuterol (PROVENTIL) (2.5 MG/3ML) 0.083% nebulizer solution Take 3 mLs (2.5 mg total) by nebulization every 6 (six) hours as needed for wheezing or shortness of breath. 75 mL 12   albuterol (VENTOLIN HFA) 108 (90 Base) MCG/ACT inhaler Inhale 2 puffs into the lungs every 6 (six) hours as needed for wheezing or shortness of breath. 8.5 g 5   amiodarone (PACERONE) 200 MG tablet Take 1/2 tablet (100mg ) by mouth three days per week (M,W,F) 6 tablet 3   atorvastatin (LIPITOR) 80 MG tablet Take 1 tablet (80 mg total) by mouth daily. 90 tablet 2   benzonatate (TESSALON) 100 MG capsule Take 1 capsule (100 mg total) by mouth 3 (three) times daily as needed. 20 capsule 0   Budeson-Glycopyrrol-Formoterol (BREZTRI AEROSPHERE) 160-9-4.8 MCG/ACT AERO Inhale 2 puffs into the lungs in the morning and at bedtime. 10.7 g 5   dapagliflozin propanediol (FARXIGA) 5 MG TABS tablet Take 1 tablet (5 mg total) by mouth daily before breakfast. 90 tablet 2   diltiazem (CARDIZEM CD) 120 MG 24 hr capsule Take 120 mg by mouth daily. (Patient not taking: Reported on 03/26/2023)     fluticasone (FLONASE) 50 MCG/ACT nasal spray Place 2 sprays into both nostrils daily. 16 g 1   furosemide (LASIX) 40 MG tablet Take 1 tablet (40 mg total) by mouth daily. 90 tablet 0   gabapentin (NEURONTIN) 300 MG capsule Take 1 capsule (300 mg total) by mouth 3 (three) times daily. 90 capsule 5   levETIRAcetam (KEPPRA XR) 750 MG 24 hr tablet Take 1 tablet (750 mg total) by mouth at bedtime. 30 tablet 11   Multiple Vitamin (MULTIVITAMIN WITH MINERALS) TABS tablet Take 1 tablet by mouth daily in the afternoon.     omeprazole (PRILOSEC) 40 MG capsule Take 1 capsule (40 mg total) by mouth daily. 90 capsule 1   potassium chloride (KLOR-CON) 10 MEQ  tablet TAKE 2 TABLETS BY MOUTH DAILY FOR 2 DAYS  THEN REDUCE TO 1 TABLET BY MOUTH DAILY 92 tablet 3   rivaroxaban (XARELTO) 20 MG TABS tablet Take 1 tablet (20 mg total) by mouth every evening with supper. 90 tablet 1   tamsulosin (FLOMAX) 0.4 MG CAPS capsule Take 1 capsule (0.4 mg total) by mouth daily. 90 capsule 1   amoxicillin-clavulanate (AUGMENTIN) 875-125 MG tablet Take 1 tablet by mouth 2 (two) times daily. 14 tablet 0   HYDROcodone-acetaminophen (NORCO/VICODIN) 5-325 MG tablet Take 1 tablet by mouth every 6 (six) hours as needed for pain for up to 5 days. Max of 4 tablets daily. 20 tablet 0   sitaGLIPtin (JANUVIA) 50 MG tablet Take 1 tablet (50 mg total) by mouth daily. 90 tablet 1   No facility-administered medications prior to visit.    Allergies  Allergen Reactions   Iodine Other (See Comments)    neck swells   Iohexol Swelling    Neck and gland swelling per patient.   Metformin And Related Diarrhea    diarrhea    Review of Systems  Respiratory:  Positive for shortness of breath.   Cardiovascular:  Positive for chest pain (left sided chest pain).  Gastrointestinal:  Positive for abdominal pain.  Genitourinary:  Positive for frequency.       (+)foreskin tightness (+)white pus around foreskin  Musculoskeletal:        (+)shoulder pain  Neurological:  Positive for headaches.       Objective:    Physical Exam Exam conducted with a chaperone present.  Constitutional:      General: He is not in acute distress.    Appearance: Normal appearance. He is not ill-appearing.  HENT:     Head: Normocephalic and atraumatic.     Right Ear: External ear normal.     Left Ear: External ear normal.  Eyes:     Extraocular Movements: Extraocular movements intact.     Pupils: Pupils are equal, round, and reactive to light.  Cardiovascular:     Rate and Rhythm: Normal rate and regular rhythm.     Heart sounds: Normal heart sounds. No murmur heard.    No gallop.  Pulmonary:     Effort: Pulmonary effort is normal. No respiratory  distress.     Breath sounds: Examination of the left-middle field reveals decreased breath sounds. Examination of the left-lower field reveals decreased breath sounds. Decreased breath sounds (on left side) present. No wheezing or rales.     Comments: Discomfort noted with deep inhalation Genitourinary:    Penis: Uncircumcised. Phimosis (only able to retract foreskin slightly) present. No erythema, discharge or lesions.   Skin:    General: Skin is warm and dry.  Neurological:     Mental Status: He is alert and oriented to person, place, and time.  Psychiatric:        Judgment: Judgment normal.     BP 105/65 (BP Location: Right Arm, Patient Position: Sitting, Cuff Size: Small)   Pulse 87   Temp 98.1 F (36.7 C) (Oral)   Resp 16   Wt 181 lb (82.1 kg)   SpO2 97%   BMI 26.73 kg/m  Wt Readings from Last 3 Encounters:  03/26/23 181 lb 6.4 oz (82.3 kg)  03/26/23 180 lb 12.8 oz (82 kg)  03/24/23 181 lb (82.1 kg)       Assessment & Plan:  Chest pain, unspecified type Assessment & Plan: I suspect that this is pleuritic chest  pain and possibly due to worsening L pleural effusion.  Will obtain a stat CXR for further evaluation. EKG tracing is personally reviewed.  EKG notes NSR.  No acute changes.   Further recommendations pending review of CXR results.   Orders: -     EKG 12-Lead -     Comprehensive metabolic panel -     CBC with Differential/Platelet -     DG Chest 2 View; Future  Phimosis of penis Assessment & Plan: New.  Will refer to Urology.   Orders: -     Ambulatory referral to Urology   Addendum:  CXR notes:  Stable moderate left pleural effusion with left lung base atelectasis/consolidation. See phone note: Will begin empiric doxycycline, tramadol prn pain and refer back to pulmonology. Pt to go to the ER if symptoms worsen.   I, Lemont Fillers, NP, personally preformed the services described in this documentation.  All medical record entries made by the  scribe were at my direction and in my presence.  I have reviewed the chart and discharge instructions (if applicable) and agree that the record reflects my personal performance and is accurate and complete. 03/24/2023   I,Robert Bates,acting as a scribe for Lemont Fillers, NP.,have documented all relevant documentation on the behalf of Lemont Fillers, NP,as directed by  Lemont Fillers, NP while in the presence of Lemont Fillers, NP.   Lemont Fillers, NP

## 2023-03-25 ENCOUNTER — Ambulatory Visit: Payer: Medicare Other | Admitting: Family

## 2023-03-25 NOTE — Progress Notes (Unsigned)
Cardiology Office Note:    Date:  03/26/2023  ID:  Robert Bates, DOB 06-01-1941, MRN [id number] PCP: Robert Craze, NP  [facility] Providers Cardiologist:  Robert Miss, MD Cardiology APP:  Robert Lecher, PA-C          Patient Profile:   Coronary artery disease S/p CABG 05/2021 (L-LAD, S-OM, S-Dx; LA clipping) C/b  blood loss anemia requiring transfusion, atrial fibrillation with rapid rate and left recurrent pleural effusion - thoracentesis x 2  Paroxysmal atrial fibrillation  S/p L atral clipping 05/2021 Amiodarone; Xarelto  (HFpEF) heart failure with preserved ejection fraction  TTE 11/20/21: EF 55-60, no RWMA, GRII DD, normal RVSF, mildly elevated PASP, moderate LAE, mild RAE, pericardial effusion, mild MR, mild to moderate TR  Pre-CABG dopplers 04/25/21: Bilat ICA 1-39 Peripheral arterial disease  Prior ETOH abuse  S/p cholecystectomy  Aortic atherosclerosis  Thoracic aortic aneurysm CT 6/22: 3.9 cm  Echocardiogram 1/23: 3.6 cm  Abdominal aortic aneurysm  [place] 5/23: 3.2 cm (repeat 2026) Chronic Obstructive Pulmonary Disease Lung CA, Stage IIB (NSC) PET 01/2023 w/o obvious recurrence of CA  L pleural effusion s/p thoracentesis (Dr. Celine Mans - Pulmonology) 01/2023 Hypertension  Hyperlipidemia  Borderline diabetes mellitus        History of Present Illness:   Robert Bates is a 82 y.o. male who returns for f/u of CAD, CHF, AFib. He had a CT a [facility] in 12/2022 that showed a L effusion and a LUL mass. He had f/u at [facility] with Oncology and a repeat CT did not show any mass. A PET scan in 01/2023 did not show signs of recurrent CA. He had a thoracentesis with pulmonology in 01/2023. He was seen by his 5/20 with pleuritic chest pain. CXR showed increasing L pleural effusion and possible pneumonia. He has been started on antibiotics and referred back to pulmonology. He sees Dr. Celine Mans at 1:15 pm today.    He is here alone.  He notes left lower chest discomfort when he develops  worsening pleural effusion.  He has not had any substernal chest discomfort.  He does note chest tightness.  He also notes shortness of breath with mild to moderate exertion.  The symptoms improve after thoracentesis.  He has not had orthopnea, PND.  He does not take Lasix on a regular basis, like he should.  He does note lower extremity edema.  His weights have been stable.  He has not had syncope.  Review of Systems  Respiratory:  Negative for cough and wheezing.   Gastrointestinal:  Positive for nausea. Negative for diarrhea, hematochezia, melena and vomiting.  Genitourinary:  Negative for hematuria.   see HPI    Studies Reviewed:    EKG: Not done  EKG from 03/24/2023 personally reviewed-NSR, HR 81, left axis deviation, incomplete right bundle branch block, QRS 104, QTc 412, no ST-T wave changes (similar to prior tracing from 07/2022) Risk Assessment/Calculations:    CHA2DS2-VASc Score = 6   This indicates a 9.7% annual risk of stroke. The patient's score is based upon: CHF History: 1 HTN History: 1 Diabetes History: 1 Stroke History: 0 Vascular Disease History: 1 Age Score: 2 Gender Score: 0            Physical Exam:   VS:  BP 110/60   Pulse 78   Ht 5\' 9"  (1.753 m)   Wt 180 lb 12.8 oz (82 kg)   SpO2 94%   BMI 26.70 kg/m    Wt Readings from Last 3  Encounters:  03/26/23 180 lb 12.8 oz (82 kg)  03/24/23 181 lb (82.1 kg)  03/04/23 181 lb (82.1 kg)    Constitutional:      Appearance: Healthy appearance. Not in distress.  Neck:     Vascular: No JVR. JVD normal.  Pulmonary:     Comments: Decreased breath sounds L base with faint insp wheeze; +dullness to percussion; +egophony Cardiovascular:     Normal rate. Regular rhythm.     Murmurs: There is no murmur.  Edema:    Peripheral edema present.    Pretibial: bilateral 2+ edema of the pretibial area. Abdominal:     Palpations: Abdomen is soft.       ASSESSMENT AND PLAN:   Pleural effusion He has had recurrent  pleural effusion since 2022.  He initially had persistent effusion in the hospital after his bypass requiring thoracentesis.  He had a thoracentesis in August 2023 and again recently.  He had radiation for his lung cancer and I suspect that his recurrent effusions are likely related to all of this.  However, he has been on amiodarone since his bypass surgery in 2022.  I will reach out to Dr. Celine Mans as well as Dr. Elease Hashimoto to see if we should be concerned about amiodarone toxicity.  He has follow-up with pulmonology later today.  He has not been taking his Lasix on a regular basis.  I have asked him to start back on Lasix 40 mg daily.  Obtain BMET in 1 week.  Chronic heart failure with preserved ejection fraction (HCC) EF normal by echocardiogram January 2023.  NYHA IIb-III.  His worsening shortness of breath is likely all related to his pleural effusion.  He sees pulmonology later today.  He is not taking Lasix on a regular basis.  I advised him to resume Lasix 40 mg daily.  Arrange follow-up echocardiogram.  Continue Farxiga 5 mg daily.  BMET 1 week.  Follow-up 6 months.  Thoracic aortic aneurysm (HCC) 3.6 cm by echocardiogram January 2023.  Arrange follow-up echocardiogram.  Coronary artery disease Status post CABG in July 2022.  He does note some chest discomfort that seems to all be related to his pleural effusion.  EKG from 2 days ago did not demonstrate any acute changes.  He has not had symptoms that sound consistent with angina.  At this time, he does not need further ischemic evaluation.  He is not on antiplatelet therapy as he is on Xarelto.  Continue Lipitor 80 mg daily.  Follow-up in 6 months.  Essential hypertension Blood pressure is controlled.  Continue Cardizem 120 mg daily.  Hyperlipidemia LDL goal <70 LDL in December 2023 was optimal (46).  Continue Lipitor 80 mg daily.  Persistent atrial fibrillation (HCC) Status post left atrial clipping at the time of his bypass in July 2022.   Maintaining sinus rhythm on amiodarone.  Labs from primary care reviewed.  ALT was normal at 10 on 03/24/2023.  Hemoglobin was stable at 12.2 on 03/24/2023.  TSH not been checked since June 2023.  Arrange TSH with repeat BMET next week.  Creatinine clearance 76.  Continue Xarelto 20 mg daily, amiodarone 100 mg every Monday Wednesday Friday.      Dispo:  Return in about 6 months (around 09/26/2023) for Routine Follow Up, w/ Dr. Elease Hashimoto.  Signed, Tereso Newcomer, PA-C

## 2023-03-26 ENCOUNTER — Telehealth: Payer: Self-pay | Admitting: Pharmacist

## 2023-03-26 ENCOUNTER — Ambulatory Visit: Payer: Medicare Other | Attending: Physician Assistant | Admitting: Physician Assistant

## 2023-03-26 ENCOUNTER — Other Ambulatory Visit (HOSPITAL_BASED_OUTPATIENT_CLINIC_OR_DEPARTMENT_OTHER): Payer: Self-pay

## 2023-03-26 ENCOUNTER — Encounter: Payer: Self-pay | Admitting: Physician Assistant

## 2023-03-26 ENCOUNTER — Encounter: Payer: Self-pay | Admitting: Internal Medicine

## 2023-03-26 ENCOUNTER — Ambulatory Visit: Payer: Medicare Other | Admitting: Internal Medicine

## 2023-03-26 VITALS — BP 110/60 | HR 78 | Ht 69.0 in | Wt 180.8 lb

## 2023-03-26 VITALS — BP 100/60 | HR 78 | Ht 69.0 in | Wt 181.4 lb

## 2023-03-26 DIAGNOSIS — J90 Pleural effusion, not elsewhere classified: Secondary | ICD-10-CM | POA: Diagnosis not present

## 2023-03-26 DIAGNOSIS — I10 Essential (primary) hypertension: Secondary | ICD-10-CM

## 2023-03-26 DIAGNOSIS — I2510 Atherosclerotic heart disease of native coronary artery without angina pectoris: Secondary | ICD-10-CM | POA: Diagnosis not present

## 2023-03-26 DIAGNOSIS — I5032 Chronic diastolic (congestive) heart failure: Secondary | ICD-10-CM | POA: Diagnosis not present

## 2023-03-26 DIAGNOSIS — I4819 Other persistent atrial fibrillation: Secondary | ICD-10-CM | POA: Diagnosis not present

## 2023-03-26 DIAGNOSIS — I7121 Aneurysm of the ascending aorta, without rupture: Secondary | ICD-10-CM | POA: Diagnosis not present

## 2023-03-26 DIAGNOSIS — I712 Thoracic aortic aneurysm, without rupture, unspecified: Secondary | ICD-10-CM | POA: Diagnosis not present

## 2023-03-26 DIAGNOSIS — E785 Hyperlipidemia, unspecified: Secondary | ICD-10-CM

## 2023-03-26 DIAGNOSIS — I071 Rheumatic tricuspid insufficiency: Secondary | ICD-10-CM | POA: Diagnosis not present

## 2023-03-26 DIAGNOSIS — Z85118 Personal history of other malignant neoplasm of bronchus and lung: Secondary | ICD-10-CM | POA: Diagnosis not present

## 2023-03-26 DIAGNOSIS — Z951 Presence of aortocoronary bypass graft: Secondary | ICD-10-CM

## 2023-03-26 DIAGNOSIS — I340 Nonrheumatic mitral (valve) insufficiency: Secondary | ICD-10-CM

## 2023-03-26 NOTE — Patient Instructions (Addendum)
We need to definitively manage the fluid in your lungs with either a pleurodesis procedure or a pleurx catheter. Please discuss both of these options with your family and let me know which you prefer and I can get you on the schedule.    I am not going to drain the fluid until we decide one of these procedures.

## 2023-03-26 NOTE — Patient Instructions (Signed)
Medication Instructions:  Your physician has recommended you make the following change in your medication:   TAKE your Lasix everyday  *If you need a refill on your cardiac medications before your next appointment, please call your pharmacy*   Lab Work: 04/03/23 COME ANYTIME AFTER 7:15 A.M, FOR:  BMET & TSH  If you have labs (blood work) drawn today and your tests are completely normal, you will receive your results only by: MyChart Message (if you have MyChart) OR A paper copy in the mail If you have any lab test that is abnormal or we need to change your treatment, we will call you to review the results.   Testing/Procedures: Your physician has requested that you have an echocardiogram. Echocardiography is a painless test that uses sound waves to create images of your heart. It provides your doctor with information about the size and shape of your heart and how well your heart's chambers and valves are working. This procedure takes approximately one hour. There are no restrictions for this procedure. Please do NOT wear cologne, perfume, aftershave, or lotions (deodorant is allowed). Please arrive 15 minutes prior to your appointment time.    Follow-Up: At [facility], you and your health needs are our priority.  As part of our continuing mission to provide you with exceptional heart care, we have created designated Provider Care Teams.  These Care Teams include your primary Cardiologist (physician) and Advanced Practice Providers (APPs -  Physician Assistants and Nurse Practitioners) who all work together to provide you with the care you need, when you need it.  We recommend signing up for the patient portal called "MyChart".  Sign up information is provided on this After Visit Summary.  MyChart is used to connect with patients for Virtual Visits (Telemedicine).  Patients are able to view lab/test results, encounter notes, upcoming appointments, etc.  Non-urgent messages can be  sent to your provider as well.   To learn more about what you can do with MyChart, go to [email or web address].    Your next appointment:   6 month(s)  Provider:   Kristeen Miss, MD     Other Instructions

## 2023-03-26 NOTE — Telephone Encounter (Signed)
Received shipment of Farxiga 5mg  from [place] and [place] medication assistance program. Received #3 bottle of 30 tablets or 3 months supply.  Patient called - LM on VM that he can pick up at the front desk.   Also looks like patient is still filling Januvia. He can stop Januvia due to cost - Marcelline Deist is to replace Januvia.  Patient was notified.

## 2023-03-26 NOTE — Assessment & Plan Note (Signed)
LDL in December 2023 was optimal (46).  Continue Lipitor 80 mg daily.

## 2023-03-26 NOTE — Assessment & Plan Note (Signed)
EF normal by echocardiogram January 2023.  NYHA IIb-III.  His worsening shortness of breath is likely all related to his pleural effusion.  He sees pulmonology later today.  He is not taking Lasix on a regular basis.  I advised him to resume Lasix 40 mg daily.  Arrange follow-up echocardiogram.  Continue Farxiga 5 mg daily.  BMET 1 week.  Follow-up 6 months.

## 2023-03-26 NOTE — Assessment & Plan Note (Signed)
He has had recurrent pleural effusion since 2022.  He initially had persistent effusion in the hospital after his bypass requiring thoracentesis.  He had a thoracentesis in August 2023 and again recently.  He had radiation for his lung cancer and I suspect that his recurrent effusions are likely related to all of this.  However, he has been on amiodarone since his bypass surgery in 2022.  I will reach out to Dr. Celine Mans as well as Dr. Elease Hashimoto to see if we should be concerned about amiodarone toxicity.  He has follow-up with pulmonology later today.  He has not been taking his Lasix on a regular basis.  I have asked him to start back on Lasix 40 mg daily.  Obtain BMET in 1 week.

## 2023-03-26 NOTE — Assessment & Plan Note (Signed)
>>  ASSESSMENT AND PLAN FOR AORTIC ANEURYSM (HCC) WRITTEN ON 03/26/2023 10:02 AM BY WEAVER, SCOTT T, PA-C  3.6 cm by echocardiogram January 2023.  Arrange follow-up echocardiogram.

## 2023-03-26 NOTE — Assessment & Plan Note (Signed)
Status post left atrial clipping at the time of his bypass in July 2022.  Maintaining sinus rhythm on amiodarone.  Labs from primary care reviewed.  ALT was normal at 10 on 03/24/2023.  Hemoglobin was stable at 12.2 on 03/24/2023.  TSH not been checked since June 2023.  Arrange TSH with repeat BMET next week.  Creatinine clearance 76.  Continue Xarelto 20 mg daily, amiodarone 100 mg every Monday Wednesday Friday.

## 2023-03-26 NOTE — Assessment & Plan Note (Signed)
Blood pressure is controlled.  Continue Cardizem 120 mg daily.

## 2023-03-26 NOTE — Progress Notes (Signed)
**Note Robert-Identified via Obfuscation** Robert Bates    [id number]    August 20, 1941  Primary Care Physician:Robert Bates, Robert Kaufmann, NP Date of Appointment: 03/26/2023 Established Patient Visit  Chief complaint:   Chief Complaint  Patient presents with   Follow-up    F/up on pleural effusion     HPI: Robert Bates is a 82 y.o.  man with COPD Gold stage 2 FEV1 62% of predicted, lung cancer s/p curative radiation in 2016 and CAD s/p CABG.   Interval Updates: Here for follow up for recurrent pleural effusion. Worsening shortness of breath and left abdominal pain, pleuritic. No fevers chills. Empiric doxycyline given by pcp.   Wife passed away in the hospital two months ago after he saw me in the clinic.   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  Still taking breztri inhaler 2 puffs.   No fevers chills, night sweats.   Here to discuss options for definitive management of this recurrent lymphocyte predominant effusion Brought his niece with him who is a retired Administrator, arts.  I have reviewed the patient's family social and past medical history and updated as appropriate.   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   COPD (chronic obstructive pulmonary disease) (HCC)    Coronary artery disease    Diabetes type 2, controlled (HCC) 07/31/2017   Dyspnea    Dysrhythmia    a fib   GERD (gastroesophageal reflux disease)    Hematuria    refuses work up or referral - understands risks of morbidity / mortality - 11/2008, 12/2008   Heme positive stool    History of hiatal hernia    History of kidney stones    Hyperlipemia    Meningioma (HCC) 10/25/2013   Follows with Dr. Coletta Bates.     Peripheral vascular disease (HCC)    Abdominal Aortic Aneursym   Pneumonia    as a child   Radiation 09/18/15-10/25/15   left lower lobe 70.2 Gy   Seizures (HCC) 02/18/2020   Tobacco abuse     Past Surgical History:  Procedure Laterality Date   CHOLECYSTECTOMY N/A 07/23/2017   Procedure: LAPAROSCOPIC CHOLECYSTECTOMY;  Surgeon: Robert Pickle, MD;  Location: [facility];  Service: General;  Laterality: N/A;   CLIPPING OF ATRIAL APPENDAGE Left 05/08/2021   Procedure: CLIPPING OF ATRIAL APPENDAGE USING 45 ATRICLIP;  Surgeon: Robert Dolin, MD;  Location: [facility];  Service: Open Heart Surgery;  Laterality: Left;   COLONOSCOPY     CORONARY ARTERY BYPASS GRAFT N/A 05/08/2021   Procedure: CORONARY ARTERY BYPASS GRAFTING (CABG)X 3 USING LEFT INTERNAL MAMMARY ARTERY AND RIGHT GREATER SAPEHNOUS VEIN;  Surgeon: Robert Dolin, MD;  Location: [facility];  Service: Open Heart Surgery;  Laterality: N/A;   ENDOVEIN HARVEST OF GREATER SAPHENOUS VEIN Right 05/08/2021   Procedure: ENDOVEIN HARVEST OF GREATER SAPHENOUS VEIN;  Surgeon: Robert Dolin, MD;  Location: [facility];  Service: Open Heart Surgery;  Laterality: Right;   EYE SURGERY Bilateral    Cataracts removed w/ lens implant   HERNIA REPAIR     Left 36 years ago . Right inguinal hernia repair 10-01-17 Dr. Sheliah Hatch  INGUINAL HERNIA REPAIR Right 10/01/2017   Procedure: RIGHT INGUINAL HERNIA REPAIR WITH MESH;  Surgeon: Kinsinger, Robert Blanch, MD;  Location: [facility];  Service: General;  Laterality: Right;  TAP BLOCK   INSERTION OF MESH Right 10/01/2017   Procedure: INSERTION OF MESH;  Surgeon: Robert Hatch Robert Blanch, MD;  Location: [facility];  Service: General;  Laterality: Right;   IR THORACENTESIS ASP PLEURAL SPACE W/IMG GUIDE  05/18/2021   IR THORACENTESIS ASP PLEURAL SPACE W/IMG GUIDE  06/07/2021   LEFT HEART CATH AND CORONARY ANGIOGRAPHY N/A 04/20/2021   Procedure: LEFT HEART CATH AND CORONARY ANGIOGRAPHY;  Surgeon: Robert Ouch, MD;  [facility]: [facility]  INVASIVE CV LAB;  Service: Cardiovascular;  Laterality: N/A;   TEE WITHOUT CARDIOVERSION N/A 05/08/2021   Procedure: TRANSESOPHAGEAL ECHOCARDIOGRAM (TEE);  Surgeon: Robert Dolin, MD;  Location: [facility] OR;  Service: Open Heart Surgery;  Laterality: N/A;   TONSILLECTOMY     TONSILLECTOMY     VIDEO BRONCHOSCOPY Bilateral 07/26/2015   Procedure: VIDEO BRONCHOSCOPY WITH FLUORO;  Surgeon: Robert Cowden, MD;  [facility];  Service: Cardiopulmonary;  Laterality: Bilateral;   VIDEO BRONCHOSCOPY WITH ENDOBRONCHIAL NAVIGATION N/A 08/23/2015   Procedure: VIDEO BRONCHOSCOPY WITH ENDOBRONCHIAL NAVIGATION;  Surgeon: Robert Ovens, MD;  [facility];  Service: Thoracic;  Laterality: N/A;   VIDEO BRONCHOSCOPY WITH ENDOBRONCHIAL ULTRASOUND N/A 08/23/2015   Procedure: VIDEO BRONCHOSCOPY WITH ENDOBRONCHIAL ULTRASOUND;  Surgeon: Robert Ovens, MD;  [facility];  Service: Thoracic;  Laterality: N/A;    Family History  Problem Relation Age of Onset   Leukemia Father    Emphysema Father    Learning disabilities Son    Atrial fibrillation Son    Leukemia Other    Stroke Other     Social History   Occupational History   Occupation: Retired    Associate Professor: [employer]    Comment: truck Air traffic controller: [employer]  Tobacco Use   Smoking status: Former    Packs/day: 1.00    Years: 57.00    Additional pack years: 0.00    Total pack years: 57.00    Types: Cigarettes, Cigars    Quit date: 08/08/2015    Years since quitting: 7.6   Smokeless tobacco: Former    Types: Chew    Quit date: 11/04/1958   Tobacco comments:    Will smoke cigar every once in awhile.  Vaping daily started a couple weeks ago.  04/18/22 hfb  Vaping Use   Vaping Use: Former  Substance and Sexual Activity   Alcohol use: Not Currently    Alcohol/week: 0.0 standard drinks of alcohol   Drug use: No   Sexual activity: Not Currently     Physical Exam: Blood pressure 100/60, pulse 78, height 5\' 9"  (1.753 m),  weight 181 lb 6.4 oz (82.3 kg), SpO2 96 %.  Gen:      No acute distressLungs:    diminished. No wheezes or crackles CV:         RRR no mrg Lungs: ctab , intermittent wheeze, diminished  Data Reviewed: Imaging: I have personally reviewed the chest xray May 2024 - recurrent left pleural effusion. PFTs:     Latest Ref Rng & Units 05/19/2017   10:02 AM 08/25/2015    9:55 AM  PFT Results  FVC-Pre L 2.84  3.43   FVC-Predicted Pre % 73  87   FVC-Post L 3.16  3.87   FVC-Predicted Post % 81  98   Pre FEV1/FVC % %  61  67   Post FEV1/FCV % % 61  65   FEV1-Pre L 1.74  2.31   FEV1-Predicted Pre % 62  81   FEV1-Post L 1.93  2.50   DLCO uncorrected ml/min/mmHg 16.14  18.35   DLCO UNC% % 54  61   DLCO corrected ml/min/mmHg 16.10    DLCO COR %Predicted % 54    DLVA Predicted % 71  67   TLC L 5.61  6.24   TLC % Predicted % 84  93   RV % Predicted % 108  121    I have personally reviewed the patient's PFTs and moderately airflow limitation  Labs:  Immunization status: Immunization History  Administered Date(s) Administered   Fluad Quad(high Dose 65+) 07/23/2019, 09/04/2020, 07/13/2021, 08/16/2022   Influenza Split 08/02/2011, 08/18/2012   Influenza Whole 08/17/2008   Influenza, High Dose Seasonal PF 08/09/2015, 09/30/2016, 07/30/2017, 08/07/2018   Influenza,inj,Quad PF,6+ Mos 07/27/2013, 08/09/2014   PFIZER Comirnaty(Gray Top)Covid-19 Tri-Sucrose Vaccine 02/16/2021   PFIZER(Purple Top)SARS-COV-2 Vaccination 01/28/2020, 02/21/2020, 09/04/2020   Pfizer Covid-19 Vaccine Bivalent Booster 55yrs & up 08/03/2021   Pneumococcal Conjugate-13 08/09/2014   Pneumococcal Polysaccharide-23 05/14/2010   Td 07/25/2008    External Records Personally Reviewed: echocardiogram with diastolic dysfunction  Assessment:  Moderate COPD 62% of predicted, stable CAD s/p CABG July 2022 Left Pleural effusion, with malignant/atypical cells, lymphocyte predominant,  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  He and his niece are going to discuss the pros and cons. I have reviewed risks and benefits of both procedures including hospital stay, pain, infection  He is tentatively agreeable to pleurx catheter placement and will call me to let me know when to schedule. His niece would help with drainage.   This is most likely related to his previous malignancy given ipsilateral nature of effusion. Cytology has all been negative Would be extremely unusual (rare, case reportable) for this to be pulmonary toxicity from amiodarone in the absence of diffuse lung disease. Suspect he can stay on this for now.   Continue Breztri 2 puffs twice a day. Continue prn albuterol   Return to Care: No follow-ups on file. - I will call him to schedule procedure.   Durel Salts, MD Pulmonary and Critical Care Medicine [facility] [phone]

## 2023-03-26 NOTE — Assessment & Plan Note (Signed)
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  He does note some chest discomfort that seems to all be related to his pleural effusion.  EKG from 2 days ago did not demonstrate any acute changes.  He has not had symptoms that sound consistent with angina.  At this time, he does not need further ischemic evaluation.  He is not on antiplatelet therapy as he is on Xarelto.  Continue Lipitor 80 mg daily.  Follow-up in 6 months.

## 2023-03-26 NOTE — Assessment & Plan Note (Signed)
3.6 cm by echocardiogram January 2023.  Arrange follow-up echocardiogram.

## 2023-03-27 ENCOUNTER — Other Ambulatory Visit: Payer: Medicare Other | Admitting: Pharmacist

## 2023-03-27 DIAGNOSIS — H43811 Vitreous degeneration, right eye: Secondary | ICD-10-CM | POA: Diagnosis not present

## 2023-03-27 DIAGNOSIS — H04123 Dry eye syndrome of bilateral lacrimal glands: Secondary | ICD-10-CM | POA: Diagnosis not present

## 2023-03-27 DIAGNOSIS — H26491 Other secondary cataract, right eye: Secondary | ICD-10-CM | POA: Diagnosis not present

## 2023-03-27 LAB — HM DIABETES EYE EXAM

## 2023-04-01 ENCOUNTER — Other Ambulatory Visit (HOSPITAL_BASED_OUTPATIENT_CLINIC_OR_DEPARTMENT_OTHER): Payer: Self-pay

## 2023-04-02 ENCOUNTER — Telehealth: Payer: Self-pay | Admitting: Pharmacist

## 2023-04-02 ENCOUNTER — Ambulatory Visit: Payer: Medicare Other

## 2023-04-02 ENCOUNTER — Ambulatory Visit (HOSPITAL_BASED_OUTPATIENT_CLINIC_OR_DEPARTMENT_OTHER)
Admission: RE | Admit: 2023-04-02 | Discharge: 2023-04-02 | Disposition: A | Discharge status: Home / Self Care | Payer: Medicare Other | Source: Ambulatory Visit | Attending: Neurosurgery | Admitting: Neurosurgery

## 2023-04-02 DIAGNOSIS — G9389 Other specified disorders of brain: Secondary | ICD-10-CM | POA: Diagnosis not present

## 2023-04-02 DIAGNOSIS — I5032 Chronic diastolic (congestive) heart failure: Secondary | ICD-10-CM | POA: Insufficient documentation

## 2023-04-02 DIAGNOSIS — E119 Type 2 diabetes mellitus without complications: Secondary | ICD-10-CM

## 2023-04-02 DIAGNOSIS — D329 Benign neoplasm of meninges, unspecified: Secondary | ICD-10-CM | POA: Insufficient documentation

## 2023-04-02 DIAGNOSIS — I7121 Aneurysm of the ascending aorta, without rupture: Secondary | ICD-10-CM | POA: Insufficient documentation

## 2023-04-02 DIAGNOSIS — I071 Rheumatic tricuspid insufficiency: Secondary | ICD-10-CM | POA: Insufficient documentation

## 2023-04-02 DIAGNOSIS — I712 Thoracic aortic aneurysm, without rupture, unspecified: Secondary | ICD-10-CM | POA: Insufficient documentation

## 2023-04-02 DIAGNOSIS — I340 Nonrheumatic mitral (valve) insufficiency: Secondary | ICD-10-CM

## 2023-04-02 MED ORDER — CLOTRIMAZOLE 1 % EX CREA: 1.0000 | TOPICAL_CREAM | Freq: Two times a day (BID) | CUTANEOUS | 0 refills | Status: DC

## 2023-04-02 MED ORDER — GADOBUTROL 1 MMOL/ML IV SOLN
8.0000 mL | Freq: Once | INTRAVENOUS | Status: AC | PRN
  Administered 2023-04-02: 8 mL via INTRAVENOUS

## 2023-04-02 NOTE — Addendum Note (Signed)
Addended by: Sandford Craze on: 04/02/2023 04:31 PM   Modules accepted: Orders

## 2023-04-02 NOTE — Telephone Encounter (Signed)
Patient stopped the office today. He states that he has had a yeast infections for the last few weeks. He is concerned that it was caused by [place]. It looks like he has seen PCP about this and has been referred to urology. Appointment is 06/03/20204.   Told patient to hold [place] for now. Will reassess after appointment with urology next week.  Will forward to PCP for other recommendations if needed.

## 2023-04-02 NOTE — Telephone Encounter (Signed)
Thank you Babette Relic,  Windell Moulding, can you please advise the patient to purchase otc lotrimin to apply to the glans penis bid. Keep upcoming appointment with urology.  Also, can you please schedule him a lab visit for A1C?  Dx DM2

## 2023-04-03 ENCOUNTER — Other Ambulatory Visit (HOSPITAL_BASED_OUTPATIENT_CLINIC_OR_DEPARTMENT_OTHER): Payer: Self-pay

## 2023-04-03 ENCOUNTER — Other Ambulatory Visit (INDEPENDENT_AMBULATORY_CARE_PROVIDER_SITE_OTHER): Payer: Medicare Other

## 2023-04-03 ENCOUNTER — Ambulatory Visit: Payer: Medicare Other

## 2023-04-03 ENCOUNTER — Other Ambulatory Visit (HOSPITAL_COMMUNITY): Payer: Self-pay

## 2023-04-03 DIAGNOSIS — E119 Type 2 diabetes mellitus without complications: Secondary | ICD-10-CM

## 2023-04-03 DIAGNOSIS — Z7984 Long term (current) use of oral hypoglycemic drugs: Secondary | ICD-10-CM

## 2023-04-03 LAB — HEMOGLOBIN A1C: Hgb A1c MFr Bld: 6.7 % — ABNORMAL HIGH (ref 4.6–6.5)

## 2023-04-03 LAB — BASIC METABOLIC PANEL
BUN/Creatinine Ratio: 17 (ref 10–24)
BUN: 16 mg/dL (ref 8–27)
CO2: 26 mmol/L (ref 20–29)
Calcium: 9.1 mg/dL (ref 8.6–10.2)
Chloride: 102 mmol/L (ref 96–106)
Creatinine, Ser: 0.96 mg/dL (ref 0.76–1.27)
Glucose: 122 mg/dL — ABNORMAL HIGH (ref 70–99)
Potassium: 3.8 mmol/L (ref 3.5–5.2)
Sodium: 141 mmol/L (ref 134–144)
eGFR: 79 mL/min/{1.73_m2} (ref >59)

## 2023-04-03 LAB — TSH: TSH: 2.19 u[IU]/mL (ref 0.450–4.500)

## 2023-04-03 NOTE — Telephone Encounter (Signed)
Patient notified of provider's advise and scheduled to come in today for labs

## 2023-04-03 NOTE — Addendum Note (Signed)
Addended by: Wilford Corner on: 04/03/2023 09:44 AM   Modules accepted: Orders

## 2023-04-04 ENCOUNTER — Other Ambulatory Visit (HOSPITAL_BASED_OUTPATIENT_CLINIC_OR_DEPARTMENT_OTHER): Payer: Self-pay

## 2023-04-04 ENCOUNTER — Telehealth: Payer: Self-pay | Admitting: Family

## 2023-04-04 ENCOUNTER — Telehealth: Payer: Self-pay | Admitting: Cardiovascular Disease

## 2023-04-04 MED ORDER — SITAGLIPTIN PHOSPHATE 50 MG PO TABS
50.0000 mg | ORAL_TABLET | Freq: Every day | ORAL | 5 refills | Status: DC
  Filled 2023-04-04 (×2): qty 30, 30d supply, fill #0

## 2023-04-04 NOTE — Telephone Encounter (Signed)
Returned call to office for fax number as the one provided is the phone number/inaccurate. Left message to call back.

## 2023-04-04 NOTE — Telephone Encounter (Signed)
Marie with [facility] is requesting a copy of office visit notes from 5/22 with Tereso Newcomer.

## 2023-04-04 NOTE — Telephone Encounter (Signed)
Patient notified of results and medication change 

## 2023-04-04 NOTE — Telephone Encounter (Signed)
Meme from [facility] is calling stating they received call for this patient, but they are unsure as to who was call to begin with. She states there is no Hilda Lias in their office and they haven't seen this patient since 2013. She states that if you call back, you can push option 2 to speak with nurse for further questions.

## 2023-04-04 NOTE — Telephone Encounter (Signed)
Fax#: [phone]

## 2023-04-04 NOTE — Progress Notes (Signed)
04/04/2023 Name: Robert Bates MRN: [id number] DOB: 08-10-1941  Chief Complaint  Patient presents with   Medication Management    Robert Bates is a 82 y.o. year old male who presented for a telephone visit.   They were referred to the pharmacist by their PCP for assistance in managing medication access.    Subjective:  Patient reports today that he is in Medicare coverage gap and cost of Xarelto is high. He has about 1 week of medication left.   Medication Access/Adherence  Current Pharmacy:  [facility] [place] - [facility] [address] [place] [address] Phone: [phone] Fax: [phone]  [facility] - [place], [place] - [address] [place] [place] [address] Phone: [phone] Fax: [phone]   Patient reports affordability concerns with their medications: Yes  Patient reports access/transportation concerns to their pharmacy: No  Patient reports adherence concerns with their medications:  No       Objective:  Lab Results  Component Value Date   HGBA1C 6.7 (H) 04/03/2023    Lab Results  Component Value Date   CREATININE 0.96 04/02/2023   BUN 16 04/02/2023   NA 141 04/02/2023   K 3.8 04/02/2023   CL 102 04/02/2023   CO2 26 04/02/2023    Lab Results  Component Value Date   CHOL 127 10/07/2022   HDL 64 10/07/2022   LDLCALC 46 10/07/2022   LDLDIRECT 142.2 12/26/2008   TRIG 93 10/07/2022   CHOLHDL 2.0 10/07/2022    Medications Reviewed Today     Reviewed by [name], RPH-CPP (Pharmacist) on 03/27/23 at 1406  Med List Status: <None>   Medication Order Taking? Sig Documenting Provider Last Dose Status Informant  acetaminophen (TYLENOL) 500 MG tablet [id number] Yes Take 1-2 tablets (500-1,000 mg total) by mouth every 6 (six) hours as needed. Ardelle Balls, PA-C Taking Active Multiple Informants, Self, Pharmacy Records  albuterol (PROVENTIL) (2.5 MG/3ML) 0.083% nebulizer  solution [id number] Yes Take 3 mLs (2.5 mg total) by nebulization every 6 (six) hours as needed for wheezing or shortness of breath. Charlott Holler, MD Taking Active Multiple Informants, Self, Pharmacy Records           Med Note [facility], [place] B   Thu Jun 06, 2022 11:35 AM)    albuterol (VENTOLIN HFA) 108 (90 Base) MCG/ACT inhaler [id number] Yes Inhale 2 puffs into the lungs every 6 (six) hours as needed for wheezing or shortness of breath. Sandford Craze, NP Taking Active Multiple Informants, Self, Pharmacy Records           Med Note [place], [place] B   Thu Jun 06, 2022 11:36 AM)    amiodarone (PACERONE) 200 MG tablet [id number] Yes Take 1/2 tablet (100mg ) by mouth three days per week (M,W,F) Nahser, Deloris Ping, MD Taking Active   atorvastatin (LIPITOR) 80 MG tablet [id number] Yes Take 1 tablet (80 mg total) by mouth daily. Nahser, Deloris Ping, MD Taking Active   Budeson-Glycopyrrol-Formoterol (BREZTRI AEROSPHERE) 160-9-4.8 MCG/ACT Sandrea Matte [id number] Yes Inhale 2 puffs into the lungs in the morning and at bedtime. Charlott Holler, MD Taking Active Multiple Informants, Self, Pharmacy Records           Med Note [place], [place] B   Thu Mar 27, 2023  1:59 PM) Has some left from patient assistance program in 2023  dapagliflozin propanediol (FARXIGA) 5 MG TABS tablet [id number] Yes Take 1 tablet (5 mg total) by mouth  daily before breakfast. Sandford Craze, NP Taking Active            Med Note Clydie Braun, Glenna Durand   Wed Mar 26, 2023  9:32 AM) Dolores Lory from [facility] and Me for 2024  diltiazem (CARDIZEM CD) 120 MG 24 hr capsule [id number]  Take 120 mg by mouth daily.  Patient not taking: Reported on 03/26/2023   [provider]  Active            Med Note Hedda Slade   Wed Mar 26, 2023  1:12 PM) As per patient not taking since 2022.   doxycycline (VIBRA-TABS) 100 MG tablet [id number] Yes Take 1 tablet (100 mg total) by mouth 2 (two) times daily. Sandford Craze, NP Taking Active   fluticasone (FLONASE) 50  MCG/ACT nasal spray [id number]  Place 2 sprays into both nostrils daily. Saguier, Ramon Dredge, PA-C  Active   furosemide (LASIX) 40 MG tablet [id number] Yes Take 1 tablet (40 mg total) by mouth daily. Sandford Craze, NP Taking Active   gabapentin (NEURONTIN) 300 MG capsule [id number] Yes Take 1 capsule (300 mg total) by mouth 3 (three) times daily.  Patient taking differently: Take 300 mg by mouth 2 (two) times daily.   Sandford Craze, NP Taking Active   levETIRAcetam (KEPPRA XR) 750 MG 24 hr tablet [id number] No Take 1 tablet (750 mg total) by mouth at bedtime.  Patient not taking: Reported on 03/27/2023   Windell Norfolk, MD Not Taking Active Multiple Informants, Self, Pharmacy Records  Multiple Vitamin (MULTIVITAMIN WITH MINERALS) TABS tablet [id number]  Take 1 tablet by mouth daily in the afternoon. [provider]  Active Multiple Informants, Self, Pharmacy Records           Med Note Clydie Braun, Inda Merlin May 13, 2022 11:57 AM)    omeprazole (PRILOSEC) 40 MG capsule [id number] Yes Take 1 capsule (40 mg total) by mouth daily. Sandford Craze, NP Taking Active   potassium chloride (KLOR-CON) 10 MEQ tablet [id number] Yes TAKE 2 TABLETS BY MOUTH DAILY FOR 2 DAYS THEN REDUCE TO 1 TABLET BY MOUTH DAILY Tereso Newcomer T, PA-C Taking Active Multiple Informants, Self, Pharmacy Records  rivaroxaban (XARELTO) 20 MG TABS tablet [id number] Yes Take 1 tablet (20 mg total) by mouth every evening with supper. Nahser, Deloris Ping, MD Taking Active   tamsulosin [facility]) 0.4 MG CAPS capsule [id number] Yes Take 1 capsule (0.4 mg total) by mouth daily. Sandford Craze, NP Taking Active   traMADol (ULTRAM) 50 MG tablet [id number] Yes Take 1 tablet (50 mg total) by mouth every 8 (eight) hours as needed for up to 5 days. Sandford Craze, NP Taking Active               Assessment/Plan:   Patient does not qualify for medication assistance program for Xarelto yet - would need to spend 4% of income  out of pocket on medications. He could use the [employer] - $85/30 days or $240 for 90 days. Patient is considering.   Office did not have samples for xarelto 20mg  today but we have ordered samples for should arrive in 10 to 14 days.  Patient advised to purchase 1 to 2 week supply of Xarelto.   [name], PharmD Clinical Pharmacist [facility]

## 2023-04-04 NOTE — Telephone Encounter (Signed)
A1C is improved.  Since he was having issues with [place] with yeast infections, I would like him to remain off of [place] and instead begin [place] 50mg  once daily.

## 2023-04-07 ENCOUNTER — Other Ambulatory Visit (HOSPITAL_BASED_OUTPATIENT_CLINIC_OR_DEPARTMENT_OTHER): Payer: Self-pay

## 2023-04-07 ENCOUNTER — Telehealth: Payer: Self-pay | Admitting: Family

## 2023-04-07 ENCOUNTER — Ambulatory Visit: Payer: Medicare Other | Admitting: Urology

## 2023-04-07 ENCOUNTER — Encounter: Payer: Self-pay | Admitting: Urology

## 2023-04-07 VITALS — BP 87/65 | HR 72 | Ht 67.0 in | Wt 181.0 lb

## 2023-04-07 DIAGNOSIS — N471 Phimosis: Secondary | ICD-10-CM | POA: Diagnosis not present

## 2023-04-07 DIAGNOSIS — N481 Balanitis: Secondary | ICD-10-CM | POA: Insufficient documentation

## 2023-04-07 MED ORDER — CLOTRIMAZOLE-BETAMETHASONE 1-0.05 % EX CREA
1.0000 | TOPICAL_CREAM | Freq: Two times a day (BID) | CUTANEOUS | 2 refills | Status: DC
  Filled 2023-04-07: qty 45, 23d supply, fill #0
  Filled 2023-05-06 – 2023-06-13 (×2): qty 45, 23d supply, fill #1

## 2023-04-07 MED ORDER — GLIPIZIDE ER 2.5 MG PO TB24
2.5000 mg | ORAL_TABLET | Freq: Every day | ORAL | 0 refills | Status: DC
  Filled 2023-04-07: qty 90, 90d supply, fill #0

## 2023-04-07 NOTE — Addendum Note (Signed)
Addended by: [name] B on: 04/07/2023 02:12 PM   Modules accepted: Orders

## 2023-04-07 NOTE — Telephone Encounter (Signed)
Rx sent to pharmacy for glipizide ER 2.5mg  daily with breakfast #90 / 0 RF Tried to call patient but had to LM on VM.

## 2023-04-07 NOTE — Telephone Encounter (Signed)
Patient has recently stopped [place] due to possible yeast infection.  He was instructed to restart Januvia.  We are awaiting review of application for LIS / Extra help from Medicare. Patient's income should qualify. If he is approved then he will get coverage in the coverage gap and cost of branded medications should be around $11. Unfortunately it takes about 21 days for a decision to be made. We applied for LIS 03/27/2023 (will likely be at least 04/17/2023 before patient receives letter from [employer] Department regarding LIS decision).   We do not have Januvia samples and Medicare patients cannot use the Januvia discount card.  We can start process for medication assistance program for Januvia - requires paper application to be mailed to [employer] medication assistance program which can take 4 to 6 weeks. Will forward request to medication assistance team to start application process if PCP wants to continue Januvia.  Patient had diarrhea with metformin past.  Would not recommend pioglitazone due to CHF.   Could try low dose of glipizide ER 2.5mg  daily until we are able to find out if he would be eligible for LIS.  Will check with PCP about preference - continue with Januvia medication assistance program or try glipizide ER 2.5mg   Forwarding to PCP to see if she would like to change to glipizide or try for medication assistance program with Januvia.  LM on VM of patient that I will get back to him about our plan.

## 2023-04-07 NOTE — Telephone Encounter (Signed)
Let's start glipizide 2.5mg  instead of Januvia.  Do you mind sending that please? And notifying patient? Tks!

## 2023-04-07 NOTE — Telephone Encounter (Signed)
Pt came in office stating had a rx sent to pharmacy on 04-04-2023 sitaGLIPtin (JANUVIA) 50 MG tablet, pt mentioned insurance will not cover the meds (it will cost pt $140.00- which pt can not afford) Pt is wanting to get another meds if possible that insurance will cover for. Please advise.

## 2023-04-07 NOTE — Progress Notes (Signed)
Assessment: 1. Phimosis   2. Balanitis     Plan: I personally reviewed the patient's chart including provider notes. I personally reviewed the patient's chart including provider notes. Recommend course of Lotrisone cream to foreskin BID x 2 weeks He has a number of risk factors for surgical management with circumcision. Return to office in 2-3 weeks   Chief Complaint:  Chief Complaint  Patient presents with   Phimosis    History of Present Illness:  Robert Bates is a 82 y.o. male who is seen in consultation from Sandford Craze, NP for evaluation of phimosis.  He notes his symptoms began approximately 1 month ago.  He is uncircumcised.  He previously has not had any problems retracting the foreskin.  He recently noted some irritation of the foreskin and difficulty with retraction.  He has been using antifungal cream without significant improvement.  He reports splaying of his urinary stream.  No dysuria or gross hematuria. He also notes sensation of incomplete emptying, frequency, decreased stream, and straining to void. IPSS = 30 today. He is currently on tamsulosin.   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   COPD (chronic obstructive pulmonary disease) (HCC)    Coronary artery disease    Diabetes type 2, controlled (HCC) 07/31/2017   Dyspnea    Dysrhythmia    a fib   GERD (gastroesophageal reflux disease)    Hematuria    refuses work up or referral - understands risks of morbidity / mortality - 11/2008, 12/2008   Heme positive stool    History of hiatal hernia    History of kidney stones    Hyperlipemia    Meningioma (HCC) 10/25/2013   Follows with Dr. Coletta Memos.     Peripheral vascular disease (HCC)    Abdominal Aortic Aneursym   Pneumonia    as a child   Radiation 09/18/15-10/25/15   left lower lobe 70.2 Gy   Seizures (HCC) 02/18/2020   Tobacco abuse     Past Surgical History:  Past Surgical History:  Procedure Laterality Date   CHOLECYSTECTOMY N/A 07/23/2017   Procedure: LAPAROSCOPIC CHOLECYSTECTOMY;  Surgeon: Rodman Pickle, MD;  Location: [facility];  Service: General;  Laterality: N/A;   CLIPPING OF ATRIAL APPENDAGE Left 05/08/2021   Procedure: CLIPPING OF ATRIAL APPENDAGE USING 45 ATRICLIP;  Surgeon: Linden Dolin, MD;  Location: [facility];  Service: Open Heart Surgery;  Laterality: Left;   COLONOSCOPY     CORONARY ARTERY BYPASS GRAFT N/A 05/08/2021   Procedure: CORONARY ARTERY BYPASS GRAFTING (CABG)X 3 USING LEFT INTERNAL MAMMARY ARTERY AND RIGHT GREATER SAPEHNOUS VEIN;  Surgeon: Linden Dolin, MD;  Location: [facility];  Service: Open Heart Surgery;  Laterality: N/A;   ENDOVEIN HARVEST OF GREATER SAPHENOUS VEIN Right 05/08/2021   Procedure: ENDOVEIN HARVEST OF GREATER SAPHENOUS VEIN;  Surgeon: Linden Dolin, MD;  Location: [facility];  Service: Open Heart Surgery;  Laterality: Right;   EYE SURGERY Bilateral    Cataracts removed w/ lens implant   HERNIA REPAIR     Left 36 years ago . Right inguinal hernia repair 10-01-17 Dr. Sheliah Hatch   INGUINAL HERNIA REPAIR Right 10/01/2017   Procedure: RIGHT  INGUINAL HERNIA REPAIR WITH MESH;  Surgeon: Kinsinger, De Blanch, MD;  Location: [facility];  Service: General;  Laterality: Right;  TAP BLOCK   INSERTION OF MESH Right 10/01/2017   Procedure: INSERTION OF MESH;  Surgeon: Kinsinger, De Blanch, MD;  Location: [facility];  Service: General;  Laterality: Right;   IR THORACENTESIS ASP PLEURAL SPACE W/IMG GUIDE  05/18/2021   IR THORACENTESIS ASP PLEURAL SPACE W/IMG GUIDE  06/07/2021   LEFT HEART CATH AND CORONARY ANGIOGRAPHY N/A 04/20/2021   Procedure: LEFT HEART CATH AND CORONARY ANGIOGRAPHY;  Surgeon: Iran Ouch, MD;  [facility]: [facility];  Service: Cardiovascular;  Laterality: N/A;   TEE WITHOUT CARDIOVERSION N/A 05/08/2021   Procedure: TRANSESOPHAGEAL ECHOCARDIOGRAM (TEE);  Surgeon: Linden Dolin, MD;  Location: [facility];  Service: Open Heart Surgery;  Laterality: N/A;   TONSILLECTOMY     TONSILLECTOMY     VIDEO BRONCHOSCOPY Bilateral 07/26/2015   Procedure: VIDEO BRONCHOSCOPY WITH FLUORO;  Surgeon: Nyoka Cowden, MD;  [facility]: [facility];  Service: Cardiopulmonary;  Laterality: Bilateral;   VIDEO BRONCHOSCOPY WITH ENDOBRONCHIAL NAVIGATION N/A 08/23/2015   Procedure: VIDEO BRONCHOSCOPY WITH ENDOBRONCHIAL NAVIGATION;  Surgeon: Delight Ovens, MD;  [facility]: [facility];  Service: Thoracic;  Laterality: N/A;   VIDEO BRONCHOSCOPY WITH ENDOBRONCHIAL ULTRASOUND N/A 08/23/2015   Procedure: VIDEO BRONCHOSCOPY WITH ENDOBRONCHIAL ULTRASOUND;  Surgeon: Delight Ovens, MD;  Location: [facility];  Service: Thoracic;  Laterality: N/A;    Allergies:  Allergies  Allergen Reactions   Iodine Other (See Comments)    neck swells   Iohexol Swelling    Neck and gland swelling per patient.   Metformin And Related Diarrhea    diarrhea    Family History:  Family History  Problem Relation Age of Onset   Leukemia Father    Emphysema Father    Learning disabilities Son    Atrial fibrillation Son    Leukemia Other    Stroke Other     Social History:  Social History   Tobacco Use   Smoking status: Former    Packs/day: 1.00    Years: 57.00    Additional pack years: 0.00    Total pack years: 57.00    Types: Cigarettes, Cigars    Quit date: 08/08/2015    Years since quitting: 7.6   Smokeless tobacco: Former    Types: Chew    Quit date: 11/04/1958   Tobacco comments:    Will smoke cigar every once in awhile.  Vaping daily started a couple weeks ago.  04/18/22 hfb  Vaping Use   Vaping Use: Former  Substance Use Topics   Alcohol use: Not Currently    Alcohol/week: 0.0 standard drinks of alcohol    Drug use: No    Review of symptoms:  Constitutional:  Negative for unexplained weight loss, night sweats, fever, chills ENT:  Negative for nose bleeds, sinus pain, painful swallowing CV:  Negative for chest pain, shortness of breath, exercise intolerance, palpitations, loss of consciousness Resp:  Negative for cough, wheezing, shortness of breath GI:  Negative for nausea, vomiting, diarrhea, bloody stools GU:  Positives noted in HPI; otherwise negative for gross hematuria, dysuria, urinary incontinence Neuro:  Negative for seizures, poor balance, limb weakness, slurred speech Psych:  Negative for lack of energy, depression, anxiety Endocrine:  Negative for polydipsia, polyuria, symptoms of hypoglycemia (dizziness, hunger, sweating) Hematologic:  Negative for anemia, purpura, petechia, prolonged or excessive bleeding, use of anticoagulants  Allergic:  Negative for difficulty breathing  or choking as a result of exposure to anything; no shellfish allergy; no allergic response (rash/itch) to materials, foods  Physical exam: BP (!) 87/65   Pulse 72   Ht 5\' 7"  (1.702 m)   Wt 181 lb (82.1 kg)   BMI 28.35 kg/m  GENERAL APPEARANCE:  Well appearing, well developed, well nourished, NAD HEENT: Atraumatic, Normocephalic, oropharynx clear. NECK: Supple without lymphadenopathy or thyromegaly. LUNGS: Clear to auscultation bilaterally. HEART: Regular Rate and Rhythm without murmurs, gallops, or rubs. ABDOMEN: Soft, non-tender, No Masses. EXTREMITIES: Moves all extremities well.  Without clubbing, cyanosis, or edema. NEUROLOGIC:  Alert and oriented x 3, normal gait, CN II-XII grossly intact.  MENTAL STATUS:  Appropriate. BACK:  Non-tender to palpation.  No CVAT SKIN:  Warm, dry and intact.   GU: Penis: Uncircumcised; mild erythema and inflammation of the foreskin; unable to easily retract foreskin Meatus: Normal Scrotum: normal, no masses Testis: normal without masses bilateral   Results: No  sample provided

## 2023-04-07 NOTE — Telephone Encounter (Signed)
Patient notified rx was sent 

## 2023-04-08 ENCOUNTER — Other Ambulatory Visit: Payer: Medicare Other | Admitting: Pharmacist

## 2023-04-08 ENCOUNTER — Telehealth: Payer: Self-pay | Admitting: Pharmacist

## 2023-04-08 ENCOUNTER — Telehealth: Payer: Self-pay | Admitting: Family

## 2023-04-08 ENCOUNTER — Ambulatory Visit: Payer: Medicare Other

## 2023-04-08 NOTE — Telephone Encounter (Signed)
Patient's blood pressure was very low yesterday at the Urologist. Please schedule nurse visit BP check in offce.

## 2023-04-08 NOTE — Telephone Encounter (Signed)
Pt called back to let us know that his niece checked his BP once he got home from the urology apt and his BP was 128/76, HR of 71.   He asks if he still has to come in at 3pm for NV.

## 2023-04-08 NOTE — Telephone Encounter (Signed)
Patient notified not need to come in, appointment was cancelled

## 2023-04-08 NOTE — Telephone Encounter (Signed)
Had follow up phone visit scheduled today to discuss medications and potential options for getting Xarelto. Planned to help patient sign up for [employer] again is he wanted - cost of Xarelto with this program is $85 for 30 day supply and $240 for 90 days. Medication would be mailed to patient's home.   Unable to reach patient. LM on VM with CB# [phone] or [phone].

## 2023-04-08 NOTE — Telephone Encounter (Signed)
No need to come back in today. Tks

## 2023-04-08 NOTE — Telephone Encounter (Signed)
Patient scheduled to come in today at 3 pm.

## 2023-04-08 NOTE — Progress Notes (Signed)
Pt here for Blood pressure check per Sandford Craze, NP.  Pt currently takes no medication for blood pressure.  BP Readings from Last 3 Encounters:  04/07/23 (!) 87/65  03/26/23 100/60  03/26/23 110/60   BP today @ = HR =  Pt advised per

## 2023-04-09 ENCOUNTER — Other Ambulatory Visit (HOSPITAL_BASED_OUTPATIENT_CLINIC_OR_DEPARTMENT_OTHER): Payer: Self-pay

## 2023-04-15 ENCOUNTER — Other Ambulatory Visit: Payer: Medicare Other | Admitting: Pharmacist

## 2023-04-15 NOTE — Progress Notes (Signed)
04/15/2023 Name: Robert Bates MRN: [id number] DOB: [birth year]  Chief Complaint  Patient presents with   Medication Management    Robert Bates is a 82 y.o. year old male who presented for a telephone visit.   They were referred to the pharmacist by their PCP for assistance in managing medication access.    Subjective:  Patient reports today that he is in Medicare coverage gap and cost of Xarelto is high. About 2 weeks ago we applied for LIS but patient dropped of a letter last week that shows he was denied LIS due to his income being about $240 over yearly income limit.   Patient will have to spend 4% of income on medications to qualify for Robert Bates and Robert Bates patient assistance. Which would be about $920. Patient has not met this amount.   Medication Access/Adherence  Patient reports affordability concerns with their medications: Yes  Patient reports access/transportation concerns to their pharmacy: No  Patient reports adherence concerns with their medications:  No       Objective:  Lab Results  Component Value Date   HGBA1C 6.7 (H) 04/03/2023    Lab Results  Component Value Date   CREATININE 0.96 04/02/2023   BUN 16 04/02/2023   NA 141 04/02/2023   K 3.8 04/02/2023   CL 102 04/02/2023   CO2 26 04/02/2023    Lab Results  Component Value Date   CHOL 127 10/07/2022   HDL 64 10/07/2022   LDLCALC 46 10/07/2022   LDLDIRECT 142.2 12/26/2008   TRIG 93 10/07/2022   CHOLHDL 2.0 10/07/2022    Medications Reviewed Today     Reviewed by [name], RPH-CPP (Pharmacist) on 04/15/23 at 1646  Med List Status: <None>   Medication Order Taking? Sig Documenting Provider Last Dose Status Informant  acetaminophen (TYLENOL) 500 MG tablet [id number] Yes Take 1-2 tablets (500-1,000 mg total) by mouth every 6 (six) hours as needed. Ardelle Balls, PA-C Taking Active Multiple Informants, Self, Pharmacy Records  albuterol (PROVENTIL) (2.5 MG/3ML) 0.083% nebulizer  solution [id number] Yes Take 3 mLs (2.5 mg total) by nebulization every 6 (six) hours as needed for wheezing or shortness of breath. Charlott Holler, MD Taking Active Multiple Informants, Self, Pharmacy Records           Med Note [facility] [place] B   Thu Jun 06, 2022 11:35 AM)    albuterol (VENTOLIN HFA) 108 (90 Base) MCG/ACT inhaler [id number] Yes Inhale 2 puffs into the lungs every 6 (six) hours as needed for wheezing or shortness of breath. Robert Craze, NP Taking Active Multiple Informants, Self, Pharmacy Records           Med Note [place], [place] B   Thu Jun 06, 2022 11:36 AM)    amiodarone (PACERONE) 200 MG tablet [id number] Yes Take 1/2 tablet (100mg ) by mouth three days per week (M,W,F) Nahser, Deloris Ping, MD Taking Active   atorvastatin (LIPITOR) 80 MG tablet [id number] Yes Take 1 tablet (80 mg total) by mouth daily. Nahser, Deloris Ping, MD Taking Active   Budeson-Glycopyrrol-Formoterol (BREZTRI AEROSPHERE) 160-9-4.8 MCG/ACT Sandrea Matte [id number] Yes Inhale 2 puffs into the lungs in the morning and at bedtime. Charlott Holler, MD Taking Active Multiple Informants, Self, Pharmacy Records           Med Note [place], Cindie Laroche Mar 27, 2023  1:59 PM) Has some left from patient assistance program in 2023  clotrimazole (LOTRIMIN AF) 1 % cream [id number]  Apply 1 Application topically 2 (  two) times daily. Robert Craze, NP  Active   clotrimazole-betamethasone (LOTRISONE) cream [id number] Yes Apply 1 Application topically 2 (two) times daily. Stoneking, Danford Bad., MD Taking Active   diltiazem (CARDIZEM CD) 120 MG 24 hr capsule [id number] No Take 120 mg by mouth daily.  Patient not taking: Reported on 03/26/2023   [provider] Not Taking Active            Med Note Ocie Doyne Mar 26, 2023  1:12 PM) As per patient not taking since 2022.   fluticasone (FLONASE) 50 MCG/ACT nasal spray [id number]  Place 2 sprays into both nostrils daily. Saguier, Ramon Dredge, PA-C  Active   furosemide  (LASIX) 40 MG tablet [id number] Yes Take 1 tablet (40 mg total) by mouth daily. Robert Craze, NP Taking Active   gabapentin (NEURONTIN) 300 MG capsule [id number] Yes Take 1 capsule (300 mg total) by mouth 3 (three) times daily.  Patient taking differently: Take 300 mg by mouth 2 (two) times daily.   Robert Craze, NP Taking Active   glipiZIDE (GLUCOTROL XL) 2.5 MG 24 hr tablet [id number] Yes Take 1 tablet (2.5 mg total) by mouth daily with breakfast. Bradd Canary, MD Taking Active   levETIRAcetam (KEPPRA XR) 750 MG 24 hr tablet [id number] No Take 1 tablet (750 mg total) by mouth at bedtime.  Patient not taking: Reported on 03/27/2023   Robert Norfolk, MD Not Taking Active Multiple Informants, Self, Pharmacy Records  Multiple Vitamin (MULTIVITAMIN WITH MINERALS) TABS tablet [id number]  Take 1 tablet by mouth daily in the afternoon. [provider]  Active Multiple Informants, Self, Pharmacy Records           Med Note Robert Bates, Robert Bates May 13, 2022 11:57 AM)    omeprazole (PRILOSEC) 40 MG capsule [id number] Yes Take 1 capsule (40 mg total) by mouth daily. Robert Craze, NP Taking Active   potassium chloride (KLOR-CON) 10 MEQ tablet [id number] Yes TAKE 2 TABLETS BY MOUTH DAILY FOR 2 DAYS THEN REDUCE TO 1 TABLET BY MOUTH DAILY Tereso Newcomer T, PA-C Taking Active Multiple Informants, Self, Pharmacy Records  rivaroxaban (XARELTO) 20 MG TABS tablet [id number] Yes Take 1 tablet (20 mg total) by mouth every evening with supper. Nahser, Deloris Ping, MD Taking Active   tamsulosin [facility]) 0.4 MG CAPS capsule [id number] Yes Take 1 capsule (0.4 mg total) by mouth daily. Robert Craze, NP Taking Active               Assessment/Plan:   Patient does not qualify for medication assistance program for Xarelto yet - would need to spend 4% of income out of pocket on medications. He could use the [employer] - $85/30 days or $240 for 90 days. Patient is considering.    Provided samples but only had 15mg  and 2.5mg  tablets - patient is aware to take 1 of the 15mg  and 2 of the 2.5mg  tablets for a total daily dose of 20mg .   [name], PharmD Clinical Pharmacist [facility]

## 2023-04-16 ENCOUNTER — Other Ambulatory Visit (HOSPITAL_BASED_OUTPATIENT_CLINIC_OR_DEPARTMENT_OTHER): Payer: Self-pay

## 2023-04-22 ENCOUNTER — Other Ambulatory Visit: Payer: Self-pay

## 2023-04-22 ENCOUNTER — Emergency Department (HOSPITAL_BASED_OUTPATIENT_CLINIC_OR_DEPARTMENT_OTHER): Payer: Medicare Other

## 2023-04-22 ENCOUNTER — Encounter (HOSPITAL_BASED_OUTPATIENT_CLINIC_OR_DEPARTMENT_OTHER): Payer: Self-pay | Admitting: Emergency Medicine

## 2023-04-22 ENCOUNTER — Inpatient Hospital Stay (HOSPITAL_BASED_OUTPATIENT_CLINIC_OR_DEPARTMENT_OTHER)
Admission: EM | Admit: 2023-04-22 | Discharge: 2023-04-25 | DRG: 291 | Disposition: A | Discharge status: Home / Self Care | Payer: Medicare Other | Attending: Internal Medicine | Admitting: Internal Medicine

## 2023-04-22 ENCOUNTER — Other Ambulatory Visit (HOSPITAL_BASED_OUTPATIENT_CLINIC_OR_DEPARTMENT_OTHER): Payer: Self-pay

## 2023-04-22 DIAGNOSIS — I110 Hypertensive heart disease with heart failure: Principal | ICD-10-CM | POA: Diagnosis present

## 2023-04-22 DIAGNOSIS — I5032 Chronic diastolic (congestive) heart failure: Secondary | ICD-10-CM

## 2023-04-22 DIAGNOSIS — J449 Chronic obstructive pulmonary disease, unspecified: Secondary | ICD-10-CM | POA: Diagnosis present

## 2023-04-22 DIAGNOSIS — Z888 Allergy status to other drugs, medicaments and biological substances status: Secondary | ICD-10-CM

## 2023-04-22 DIAGNOSIS — I2510 Atherosclerotic heart disease of native coronary artery without angina pectoris: Secondary | ICD-10-CM | POA: Diagnosis not present

## 2023-04-22 DIAGNOSIS — Z806 Family history of leukemia: Secondary | ICD-10-CM

## 2023-04-22 DIAGNOSIS — Z79899 Other long term (current) drug therapy: Secondary | ICD-10-CM | POA: Diagnosis not present

## 2023-04-22 DIAGNOSIS — Z923 Personal history of irradiation: Secondary | ICD-10-CM

## 2023-04-22 DIAGNOSIS — Z7901 Long term (current) use of anticoagulants: Secondary | ICD-10-CM

## 2023-04-22 DIAGNOSIS — Z87442 Personal history of urinary calculi: Secondary | ICD-10-CM | POA: Diagnosis not present

## 2023-04-22 DIAGNOSIS — Z91041 Radiographic dye allergy status: Secondary | ICD-10-CM

## 2023-04-22 DIAGNOSIS — C3490 Malignant neoplasm of unspecified part of unspecified bronchus or lung: Secondary | ICD-10-CM | POA: Diagnosis present

## 2023-04-22 DIAGNOSIS — Z87891 Personal history of nicotine dependence: Secondary | ICD-10-CM | POA: Diagnosis not present

## 2023-04-22 DIAGNOSIS — J8410 Pulmonary fibrosis, unspecified: Secondary | ICD-10-CM | POA: Diagnosis present

## 2023-04-22 DIAGNOSIS — Z91148 Patient's other noncompliance with medication regimen for other reason: Secondary | ICD-10-CM

## 2023-04-22 DIAGNOSIS — E1151 Type 2 diabetes mellitus with diabetic peripheral angiopathy without gangrene: Secondary | ICD-10-CM | POA: Diagnosis not present

## 2023-04-22 DIAGNOSIS — G40909 Epilepsy, unspecified, not intractable, without status epilepticus: Secondary | ICD-10-CM | POA: Diagnosis present

## 2023-04-22 DIAGNOSIS — J910 Malignant pleural effusion: Secondary | ICD-10-CM | POA: Diagnosis not present

## 2023-04-22 DIAGNOSIS — I509 Heart failure, unspecified: Secondary | ICD-10-CM | POA: Diagnosis not present

## 2023-04-22 DIAGNOSIS — R569 Unspecified convulsions: Secondary | ICD-10-CM

## 2023-04-22 DIAGNOSIS — T501X6A Underdosing of loop [high-ceiling] diuretics, initial encounter: Secondary | ICD-10-CM | POA: Diagnosis present

## 2023-04-22 DIAGNOSIS — E119 Type 2 diabetes mellitus without complications: Secondary | ICD-10-CM

## 2023-04-22 DIAGNOSIS — E785 Hyperlipidemia, unspecified: Secondary | ICD-10-CM | POA: Diagnosis present

## 2023-04-22 DIAGNOSIS — I5033 Acute on chronic diastolic (congestive) heart failure: Secondary | ICD-10-CM | POA: Diagnosis present

## 2023-04-22 DIAGNOSIS — I4891 Unspecified atrial fibrillation: Secondary | ICD-10-CM | POA: Diagnosis present

## 2023-04-22 DIAGNOSIS — Z85118 Personal history of other malignant neoplasm of bronchus and lung: Secondary | ICD-10-CM | POA: Diagnosis present

## 2023-04-22 DIAGNOSIS — Z7984 Long term (current) use of oral hypoglycemic drugs: Secondary | ICD-10-CM | POA: Diagnosis not present

## 2023-04-22 DIAGNOSIS — J9811 Atelectasis: Secondary | ICD-10-CM | POA: Diagnosis not present

## 2023-04-22 DIAGNOSIS — Z7951 Long term (current) use of inhaled steroids: Secondary | ICD-10-CM

## 2023-04-22 DIAGNOSIS — K219 Gastro-esophageal reflux disease without esophagitis: Secondary | ICD-10-CM | POA: Diagnosis present

## 2023-04-22 DIAGNOSIS — Z825 Family history of asthma and other chronic lower respiratory diseases: Secondary | ICD-10-CM

## 2023-04-22 DIAGNOSIS — I4819 Other persistent atrial fibrillation: Secondary | ICD-10-CM | POA: Diagnosis present

## 2023-04-22 DIAGNOSIS — I10 Essential (primary) hypertension: Secondary | ICD-10-CM | POA: Diagnosis present

## 2023-04-22 DIAGNOSIS — I480 Paroxysmal atrial fibrillation: Secondary | ICD-10-CM | POA: Diagnosis present

## 2023-04-22 DIAGNOSIS — J90 Pleural effusion, not elsewhere classified: Secondary | ICD-10-CM | POA: Diagnosis present

## 2023-04-22 DIAGNOSIS — Z86011 Personal history of benign neoplasm of the brain: Secondary | ICD-10-CM

## 2023-04-22 DIAGNOSIS — Z823 Family history of stroke: Secondary | ICD-10-CM

## 2023-04-22 DIAGNOSIS — Z9049 Acquired absence of other specified parts of digestive tract: Secondary | ICD-10-CM

## 2023-04-22 DIAGNOSIS — N400 Enlarged prostate without lower urinary tract symptoms: Secondary | ICD-10-CM | POA: Diagnosis present

## 2023-04-22 DIAGNOSIS — R0789 Other chest pain: Secondary | ICD-10-CM

## 2023-04-22 DIAGNOSIS — Z951 Presence of aortocoronary bypass graft: Secondary | ICD-10-CM

## 2023-04-22 DIAGNOSIS — R0602 Shortness of breath: Principal | ICD-10-CM

## 2023-04-22 LAB — CBC
HCT: 37.4 % — ABNORMAL LOW (ref 39.0–52.0)
Hemoglobin: 11.3 g/dL — ABNORMAL LOW (ref 13.0–17.0)
MCH: 26.1 pg (ref 26.0–34.0)
MCHC: 30.2 g/dL (ref 30.0–36.0)
MCV: 86.4 fL (ref 80.0–100.0)
Platelets: 249 10*3/uL (ref 150–400)
RBC: 4.33 MIL/uL (ref 4.22–5.81)
RDW: 15.8 % — ABNORMAL HIGH (ref 11.5–15.5)
WBC: 7.6 10*3/uL (ref 4.0–10.5)
nRBC: 0 % (ref 0.0–0.2)

## 2023-04-22 LAB — BASIC METABOLIC PANEL
Anion gap: 10 (ref 5–15)
BUN: 19 mg/dL (ref 8–23)
CO2: 27 mmol/L (ref 22–32)
Calcium: 9.1 mg/dL (ref 8.9–10.3)
Chloride: 104 mmol/L (ref 98–111)
Creatinine, Ser: 0.89 mg/dL (ref 0.61–1.24)
GFR, Estimated: >60 mL/min (ref >60)
Glucose, Bld: 97 mg/dL (ref 70–99)
Potassium: 3.6 mmol/L (ref 3.5–5.1)
Sodium: 141 mmol/L (ref 135–145)

## 2023-04-22 LAB — BRAIN NATRIURETIC PEPTIDE: B Natriuretic Peptide: 180.5 pg/mL — ABNORMAL HIGH (ref 0.0–100.0)

## 2023-04-22 MED ORDER — IPRATROPIUM-ALBUTEROL 0.5-2.5 (3) MG/3ML IN SOLN
3.0000 mL | Freq: Once | RESPIRATORY_TRACT | Status: DC
  Filled 2023-04-22: qty 3

## 2023-04-22 MED ORDER — LEVALBUTEROL HCL 1.25 MG/0.5ML IN NEBU
1.2500 mg | INHALATION_SOLUTION | Freq: Once | RESPIRATORY_TRACT | Status: AC
  Administered 2023-04-22: 1.25 mg via RESPIRATORY_TRACT
  Filled 2023-04-22: qty 0.5

## 2023-04-22 MED ORDER — ALBUTEROL SULFATE HFA 108 (90 BASE) MCG/ACT IN AERS: 2.0000 | INHALATION_SPRAY | RESPIRATORY_TRACT | Status: DC | PRN

## 2023-04-22 MED ORDER — ALBUTEROL SULFATE (2.5 MG/3ML) 0.083% IN NEBU
2.5000 mg | INHALATION_SOLUTION | RESPIRATORY_TRACT | Status: DC | PRN
  Administered 2023-04-22: 2.5 mg via RESPIRATORY_TRACT
  Filled 2023-04-22: qty 3

## 2023-04-22 NOTE — ED Notes (Addendum)
Pt reports he has not taken "fluid pill" in 3-4 days.

## 2023-04-22 NOTE — ED Provider Notes (Signed)
[facility] AT [facility] Provider Note   CSN: [id number] Arrival date & time: 04/22/23  1614     History  Chief Complaint  Patient presents with   Shortness of Breath    Robert Bates is a 82 y.o. male.  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 Pt does not wear oxygen at baseline. He states for the last three days he has become progressively short of breath, and states that at baseline he is able to walk half a block. Today, the walk from the car to the pharmacy entrance he experienced chest tightness and worsening shortness of breath and decided to come in.   He denies any recent fevers, chills, dysuria, nausea, or vomiting.    Shortness of Breath      Home Medications Prior to Admission medications   Medication Sig Start Date End Date Taking? Authorizing Provider  acetaminophen (TYLENOL) 500 MG tablet Take 1-2 tablets (500-1,000 mg total) by mouth every 6 (six) hours as needed. 05/16/21   Ardelle Balls, PA-C  albuterol (PROVENTIL) (2.5 MG/3ML) 0.083% nebulizer solution Take 3 mLs (2.5 mg total) by nebulization every 6 (six) hours as needed for wheezing or shortness of breath. 10/17/21   Charlott Holler, MD  albuterol (VENTOLIN HFA) 108 (90 Base) MCG/ACT inhaler Inhale 2 puffs into the lungs every 6 (six) hours as needed for wheezing or shortness of breath. 07/27/21   Sandford Craze, NP  amiodarone (PACERONE) 200 MG tablet Take 1/2 tablet (100mg ) by mouth three days per week (M,W,F) 10/07/22   Nahser, Deloris Ping, MD  atorvastatin (LIPITOR) 80 MG tablet Take 1 tablet (80 mg total) by mouth daily. 01/17/23   Nahser, Deloris Ping, MD  Budeson-Glycopyrrol-Formoterol (BREZTRI AEROSPHERE) 160-9-4.8 MCG/ACT AERO Inhale 2 puffs into  the lungs in the morning and at bedtime. 10/17/21   Charlott Holler, MD  clotrimazole (LOTRIMIN AF) 1 % cream Apply 1 Application topically 2 (two) times daily. 04/02/23   Sandford Craze, NP  clotrimazole-betamethasone (LOTRISONE) cream Apply 1 Application topically 2 (two) times daily. 04/07/23   Stoneking, Danford Bad., MD  diltiazem (CARDIZEM CD) 120 MG 24 hr capsule Take 120 mg by mouth daily. Patient not taking: Reported on 03/26/2023    [provider]  fluticasone (FLONASE) 50 MCG/ACT nasal spray Place 2 sprays into both nostrils daily. 09/04/22   Saguier, Ramon Dredge, PA-C  furosemide (LASIX) 40 MG tablet Take 1 tablet (40 mg total) by mouth daily. 03/10/23   Sandford Craze, NP  gabapentin (NEURONTIN) 300 MG capsule Take 1 capsule (300 mg total) by mouth 3 (three) times daily. Patient taking differently: Take 300 mg by mouth 2 (two) times daily. 11/28/22   Sandford Craze, NP  glipiZIDE (GLUCOTROL XL) 2.5 MG 24 hr tablet Take 1 tablet (2.5 mg total) by mouth daily with breakfast. 04/07/23   Bradd Canary, MD  levETIRAcetam (KEPPRA XR) 750 MG 24 hr tablet Take 1 tablet (750 mg total) by mouth at bedtime. Patient not taking: Reported on 03/27/2023 04/12/22   Windell Norfolk, MD  Multiple Vitamin (MULTIVITAMIN WITH MINERALS) TABS tablet Take 1 tablet by mouth daily in the afternoon.    [provider]  omeprazole (PRILOSEC) 40 MG capsule Take 1 capsule (40 mg total) by mouth daily. 11/05/22   Peggyann Juba,  Melissa, NP  potassium chloride (KLOR-CON) 10 MEQ tablet TAKE 2 TABLETS BY MOUTH DAILY FOR 2 DAYS THEN REDUCE TO 1 TABLET BY MOUTH DAILY 04/05/22   Tereso Newcomer T, PA-C  rivaroxaban (XARELTO) 20 MG TABS tablet Take 1 tablet (20 mg total) by mouth every evening with supper. 11/20/22   Nahser, Deloris Ping, MD  tamsulosin (FLOMAX) 0.4 MG CAPS capsule Take 1 capsule (0.4 mg total) by mouth daily. 09/02/22 09/02/23  Sandford Craze, NP      Allergies    Iodine, Iohexol, and Metformin  and related    Review of Systems   Review of Systems  Respiratory:  Positive for shortness of breath.     Physical Exam Updated Vital Signs BP 133/84   Pulse 72   Temp 97.8 F (36.6 C)   Resp (!) 22   Ht 5\' 7"  (1.702 m)   Wt 82.6 kg   SpO2 94%   BMI 28.51 kg/m  Physical Exam Constitutional:      Appearance: He is ill-appearing.  Cardiovascular:     Rate and Rhythm: Tachycardia present. Rhythm irregular.  Pulmonary:     Breath sounds: Decreased air movement present. Examination of the left-upper field reveals decreased breath sounds. Examination of the left-middle field reveals decreased breath sounds and rhonchi. Examination of the left-lower field reveals decreased breath sounds and rhonchi. Decreased breath sounds and rhonchi present.  Abdominal:     General: Bowel sounds are normal. There is distension.     Tenderness: There is no abdominal tenderness.  Musculoskeletal:     Right lower leg: 2+ Pitting Edema present.     Left lower leg: 2+ Pitting Edema present.     ED Results / Procedures / Treatments   Labs (all labs ordered are listed, but only abnormal results are displayed) Labs Reviewed  CBC - Abnormal; Notable for the following components:      Result Value   Hemoglobin 11.3 (*)    HCT 37.4 (*)    RDW 15.8 (*)    All other components within normal limits  BRAIN NATRIURETIC PEPTIDE - Abnormal; Notable for the following components:   B Natriuretic Peptide 180.5 (*)    All other components within normal limits  BASIC METABOLIC PANEL    EKG EKG Interpretation  Date/Time:  Tuesday April 22 2023 16:19:28 EDT Ventricular Rate:  65 PR Interval:  126 QRS Duration: 104 QT Interval:  433 QTC Calculation: 451 R Axis:   -33 Text Interpretation: Sinus rhythm Left axis deviation RSR' in V1 or V2, probably normal variant Consider anterior infarct Nonspecific T abnormalities, lateral leads when compared to prior, similar appearance. No STEMI Confirmed by Theda Belfast ([id number]) on 04/22/2023 4:29:45 PM  Radiology DG Chest 2 View  Result Date: 04/22/2023 CLINICAL DATA:  Shortness of breath. EXAM: CHEST - 2 VIEW COMPARISON:  Mar 24, 2023 FINDINGS: Multiple sternal wires and vascular clips are noted. The heart size and mediastinal contours are within normal limits. A radiopaque atrial occlusion clip is seen. Stable parenchymal scarring and/or atelectasis is seen within the mid left lung. This extends from the left hilum and is stable in appearance. A stable left pleural effusion and associated left basilar atelectasis is noted. The right lung remains clear. No pneumothorax is identified. The visualized skeletal structures are unremarkable. IMPRESSION: 1. Evidence of prior median sternotomy/CABG. 2. Stable parenchymal scarring and/or atelectasis within the mid left lung. 3. Stable left pleural effusion and associated left basilar atelectasis. Electronically Signed   By:  Aram Candela M.D.   On: 04/22/2023 17:35    Procedures Procedures    Medications Ordered in ED Medications  albuterol (VENTOLIN HFA) 108 (90 Base) MCG/ACT inhaler 2 puff (has no administration in time range)  levalbuterol (XOPENEX) nebulizer solution 1.25 mg (1.25 mg Nebulization Given 04/22/23 1702)    ED Course/ Medical Decision Making/ A&P                             Medical Decision Making Pt presents with worsening shortness of breath in the setting of recurrent malignant pleural effusions. Per chart review, he has had multiple discussions with cardiology as well as pulmonology about placement of Pleur-X catheter, however pt has been hesitant. Pt also with history of persistent A-Fib and HR seems labile, jumping from 130 to 62 very quickly upon encounter, and while ambulating jumping to 130. EKG shows sinus rhythm, with new Left Axis Deviation. Labs somewhat reassuring, with no evidence of kidney damage, or signs of infection. BNP elevated at 180. He also states that he has not been  taking his lasix 40mg  for the last few days either. He does appear volume overloaded on exam, and given his labile HR, he would benefit from admission for full diuresis, and potential insertion of pleurx catheter. Discussed with hospitalist team and they have accepted admission.    Amount and/or Complexity of Data Reviewed Labs: ordered. Radiology: ordered.  Risk Prescription drug management. Decision regarding hospitalization.    Final Clinical Impression(s) / ED Diagnoses Final diagnoses:  SOB (shortness of breath)  Exertional shortness of breath  Pleural effusion  Chest tightness    Rx / DC Orders ED Discharge Orders     None         Kimmie Doren, Jason Fila, MD 04/22/23 1835    Tegeler, Canary Brim, MD 04/23/23 1858

## 2023-04-22 NOTE — ED Notes (Signed)
Patient saturation on room air while at rest =96% Patient saturation on room air while sitting 94% Patient saturation on room air while ambulating =90% with an increased heart rate to 139 and RR 23.   Patient recovery time with a saturation 95% on room air sitting

## 2023-04-22 NOTE — ED Notes (Signed)
Patient heart rhythm changed from NSR to Atrial Fibrillation for approximately 5 minutes. Pt converted back to NSR on his own.

## 2023-04-22 NOTE — Progress Notes (Signed)
Patient will be ordered a breathing treatment due to increased WOB and SOB

## 2023-04-22 NOTE — ED Notes (Signed)
ED TO INPATIENT HANDOFF REPORT  ED Nurse Name and Phone #: Gorden Harms, RN  S Name/Age/Gender Robert Bates 82 y.o. male Room/Bed: MH09/MH09  Code Status   Code Status: Prior  Home/SNF/Other Home Patient oriented to: self, place, time, and situation Is this baseline? Yes   Triage Complete: Triage complete  Chief Complaint CHF (congestive heart failure) (HCC) [I50.9]  Triage Note Pt POV from pharmacy-  Reports ShOB while walking into pharmacy today.  Pt speaking in short broken sentences, RT at bedside to assess.   Also c/o CP during triage.    Allergies Allergies  Allergen Reactions   Iodine Other (See Comments)    neck swells   Iohexol Swelling    Neck and gland swelling per patient.   Metformin And Related Diarrhea    diarrhea    Level of Care/Admitting Diagnosis ED Disposition     ED Disposition  Admit   Condition  --   Comment  Hospital Area: [facility] [[id number]]  Level of Care: Telemetry Medical [104]  Interfacility transfer: Yes  May place patient in observation at [facility] or [facility] if equivalent level of care is available:: No  Covid Evaluation: Asymptomatic - no recent exposure (last 10 days) testing not required  Diagnosis: CHF (congestive heart failure) [facility]) [[id number]]  Admitting Physician: Emeline General [[id number]]  Attending Physician: Emeline General [[id number]]          B Medical/Surgery History Past Medical History:  Diagnosis Date   Anxiety    Aortic dilatation (HCC) 05/13/2022   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   COPD (chronic obstructive pulmonary disease) (HCC)    Coronary artery disease    Diabetes type 2, controlled (HCC) 07/31/2017   Dyspnea    Dysrhythmia    a fib   GERD (gastroesophageal reflux disease)    Hematuria     refuses work up or referral - understands risks of morbidity / mortality - 11/2008, 12/2008   Heme positive stool    History of hiatal hernia    History of kidney stones    Hyperlipemia    Meningioma (HCC) 10/25/2013   Follows with Dr. Coletta Memos.    Peripheral vascular disease (HCC)    Abdominal Aortic Aneursym   Pneumonia    as a child   Radiation 09/18/15-10/25/15   left lower lobe 70.2 Gy   Seizures (HCC) 02/18/2020   Tobacco abuse    Past Surgical History:  Procedure Laterality Date   CHOLECYSTECTOMY N/A 07/23/2017   Procedure: LAPAROSCOPIC CHOLECYSTECTOMY;  Surgeon: Rodman Pickle, MD;  [facility]: [facility]S;  Service: General;  Laterality: N/A;   CLIPPING OF ATRIAL APPENDAGE Left 05/08/2021   Procedure: CLIPPING OF ATRIAL APPENDAGE USING 45 ATRICLIP;  Surgeon: Linden Dolin, MD;  [facility] OR;  Service: Open Heart Surgery;  Laterality: Left;   COLONOSCOPY     CORONARY ARTERY BYPASS GRAFT N/A 05/08/2021   Procedure: CORONARY ARTERY BYPASS GRAFTING (CABG)X 3 USING LEFT INTERNAL MAMMARY ARTERY AND RIGHT GREATER SAPEHNOUS VEIN;  Surgeon: Linden Dolin, MD;  [facility];  Service: Open Heart Surgery;  Laterality: N/A;   ENDOVEIN HARVEST OF GREATER SAPHENOUS VEIN Right 05/08/2021   Procedure: ENDOVEIN HARVEST OF GREATER SAPHENOUS VEIN;  Surgeon: Linden Dolin, MD;  Location: [facility]  OR;  Service: Open Heart Surgery;  Laterality: Right;   EYE SURGERY Bilateral    Cataracts removed w/ lens implant   HERNIA REPAIR     Left 36 years ago . Right inguinal hernia repair 10-01-17 Dr. Sheliah Hatch   INGUINAL HERNIA REPAIR Right 10/01/2017   Procedure: RIGHT INGUINAL HERNIA REPAIR WITH MESH;  Surgeon: Kinsinger, De Blanch, MD;  [facility];  Service: General;  Laterality: Right;  TAP BLOCK   INSERTION OF MESH Right 10/01/2017   Procedure: INSERTION OF MESH;  Surgeon: Sheliah Hatch De Blanch, MD;  [facility];  Service: General;  Laterality: Right;   IR THORACENTESIS ASP  PLEURAL SPACE W/IMG GUIDE  05/18/2021   IR THORACENTESIS ASP PLEURAL SPACE W/IMG GUIDE  06/07/2021   LEFT HEART CATH AND CORONARY ANGIOGRAPHY N/A 04/20/2021   Procedure: LEFT HEART CATH AND CORONARY ANGIOGRAPHY;  Surgeon: Iran Ouch, MD;  [facility];  Service: Cardiovascular;  Laterality: N/A;   TEE WITHOUT CARDIOVERSION N/A 05/08/2021   Procedure: TRANSESOPHAGEAL ECHOCARDIOGRAM (TEE);  Surgeon: Linden Dolin, MD;  Location: [facility] OR;  Service: Open Heart Surgery;  Laterality: N/A;   TONSILLECTOMY     TONSILLECTOMY     VIDEO BRONCHOSCOPY Bilateral 07/26/2015   Procedure: VIDEO BRONCHOSCOPY WITH FLUORO;  Surgeon: Nyoka Cowden, MD;  [facility];  Service: Cardiopulmonary;  Laterality: Bilateral;   VIDEO BRONCHOSCOPY WITH ENDOBRONCHIAL NAVIGATION N/A 08/23/2015   Procedure: VIDEO BRONCHOSCOPY WITH ENDOBRONCHIAL NAVIGATION;  Surgeon: Delight Ovens, MD;  [facility];  Service: Thoracic;  Laterality: N/A;   VIDEO BRONCHOSCOPY WITH ENDOBRONCHIAL ULTRASOUND N/A 08/23/2015   Procedure: VIDEO BRONCHOSCOPY WITH ENDOBRONCHIAL ULTRASOUND;  Surgeon: Delight Ovens, MD;  [facility];  Service: Thoracic;  Laterality: N/A;     A IV Location/Drains/Wounds Patient Lines/Drains/Airways Status     Active Line/Drains/Airways     Name Placement date Placement time Site Days   Peripheral IV 04/22/23 18 G Left Antecubital 04/22/23  1622  Antecubital  less than 1   Incision - 4 Ports Abdomen 1: Umbilicus 2: Mid;Upper 3: Right;Lateral;Upper 4: Right;Lateral;Mid 07/23/17  1313  -- 2099            Intake/Output Last 24 hours No intake or output data in the 24 hours ending 04/22/23 2125  Labs/Imaging Results for orders placed or performed during the hospital encounter of 04/22/23 (from the past 48 hour(s))  Brain natriuretic peptide     Status: Abnormal   Collection Time: 04/22/23  4:20 PM  Result Value Ref Range   B Natriuretic Peptide 180.5 (H) 0.0 - 100.0  pg/mL    Comment: Performed at [facility], [address] [place], [place] [address]  Basic metabolic panel     Status: None   Collection Time: 04/22/23  4:23 PM  Result Value Ref Range   Sodium 141 135 - 145 mmol/L   Potassium 3.6 3.5 - 5.1 mmol/L   Chloride 104 98 - 111 mmol/L   CO2 27 22 - 32 mmol/L   Glucose, Bld 97 70 - 99 mg/dL    Comment: Glucose reference range applies only to samples taken after fasting for at least 8 hours.   BUN 19 8 - 23 mg/dL   Creatinine, Ser 1.91 0.61 - 1.24 mg/dL   Calcium 9.1 8.9 - 47.8 mg/dL   GFR, Estimated >29 >56 mL/min    Comment: (NOTE) Calculated using the CKD-EPI Creatinine Equation (2021)    Anion gap 10  5 - 15    Comment: Performed at [facility], [address] [place], [place] [address]  CBC     Status: Abnormal   Collection Time: 04/22/23  4:23 PM  Result Value Ref Range   WBC 7.6 4.0 - 10.5 K/uL   RBC 4.33 4.22 - 5.81 MIL/uL   Hemoglobin 11.3 (L) 13.0 - 17.0 g/dL   HCT 60.4 (L) 54.0 - 98.1 %   MCV 86.4 80.0 - 100.0 fL   MCH 26.1 26.0 - 34.0 pg   MCHC 30.2 30.0 - 36.0 g/dL   RDW 19.1 (H) 47.8 - 29.5 %   Platelets 249 150 - 400 K/uL   nRBC 0.0 0.0 - 0.2 %    Comment: Performed at [facility], [address] [place], [place] [address]   *Note: Due to a large number of results and/or encounters for the requested time period, some results have not been displayed. A complete set of results can be found in Results Review.   DG Chest 2 View  Result Date: 04/22/2023 CLINICAL DATA:  Shortness of breath. EXAM: CHEST - 2 VIEW COMPARISON:  Mar 24, 2023 FINDINGS: Multiple sternal wires and vascular clips are noted. The heart size and mediastinal contours are within normal limits. A radiopaque atrial occlusion clip is seen. Stable parenchymal scarring and/or atelectasis is seen within the mid left lung. This extends from the left hilum and is stable in appearance. A stable left pleural effusion and  associated left basilar atelectasis is noted. The right lung remains clear. No pneumothorax is identified. The visualized skeletal structures are unremarkable. IMPRESSION: 1. Evidence of prior median sternotomy/CABG. 2. Stable parenchymal scarring and/or atelectasis within the mid left lung. 3. Stable left pleural effusion and associated left basilar atelectasis. Electronically Signed   By: Aram Candela M.D.   On: 04/22/2023 17:35    Pending Labs Unresulted Labs (From admission, onward)    None       Vitals/Pain Today's Vitals   04/22/23 1800 04/22/23 1943 04/22/23 2000 04/22/23 2040  BP: 133/84  129/86   Pulse: 72  72   Resp: (!) 22  14   Temp:      TempSrc:      SpO2: 94%  96%   Weight:      Height:      PainSc:  3   3     Isolation Precautions No active isolations  Medications Medications  albuterol (VENTOLIN HFA) 108 (90 Base) MCG/ACT inhaler 2 puff (has no administration in time range)  levalbuterol (XOPENEX) nebulizer solution 1.25 mg (1.25 mg Nebulization Given 04/22/23 1702)    Mobility Walks     Focused Assessments Cardiac Assessment Handoff:  Cardiac Rhythm: Sinus bradycardia Lab Results  Component Value Date   CKTOTAL 21 04/27/2018   CKMB 4.9 07/23/2017   TROPONINI <0.03 07/23/2017   No results found for: "DDIMER" Does the Patient currently have chest pain? Yes    R Recommendations: See Admitting Provider Note  Report given to: [facility] RN   Additional Notes: IV 18g left AC

## 2023-04-22 NOTE — ED Notes (Signed)
RT was going to give the patient a Duoneb breathing treatment but upon sitting patient up in the bed , his heart rate went into an A-fib rhythm. Duoneb was held

## 2023-04-22 NOTE — ED Notes (Signed)
[facility] called in regards to transport patient

## 2023-04-22 NOTE — ED Triage Notes (Signed)
Pt POV from pharmacy-  Reports ShOB while walking into pharmacy today.  Pt speaking in short broken sentences, RT at bedside to assess.   Also c/o CP during triage.

## 2023-04-22 NOTE — ED Notes (Signed)
Physician gave a verbal order to go ahead and give the patient a Xopenex breathing treatment. RT gave the nebulizer treatment and the patient's heart rate remained 60-72 until the treatment was completed. The patient's heart rate increased to 119 and the patient went back into an A-fib rhythm. Physician aware. Currently the physician is in the room talking to the patient.

## 2023-04-22 NOTE — Progress Notes (Signed)
Plan of Care Note for accepted transfer   Patient: Tywone Olander MRN: [id number]   DOA: 04/22/2023  Facility requesting transfer: [facility] Requesting Provider: Dr. Abundio Miu  Reason for transfer: CHF Facility course: History of chronic HFpEF, pulmonary fibrosis with chronic recurrent left-sided pleural effusion has had multiple thoracentesis recently, presented with worsening of shortness of breath, patient also has not been taking his Lasix for several days.  X-ray found worsening of left-sided pleural effusion/chest decompensation.  Last month PCCM Dr. Celine Mans saw patient and recommended PleurX to address recurrent pleural effusion patient declined offer however today patient expressed that he wants the PleurX done ASAP.  Physical exam found patient fluid overload but no hypoxic.  Plan of care: The patient is accepted for admission to Telemetry unit, at [facility]..    Author: Emeline General, MD 04/22/2023  Check [email or web address] for on-call coverage.  Nursing staff, Please call [facility] Admits & Consults System-Wide number on Amion as soon as patient's arrival, so appropriate admitting provider can evaluate the pt.

## 2023-04-23 ENCOUNTER — Other Ambulatory Visit (HOSPITAL_BASED_OUTPATIENT_CLINIC_OR_DEPARTMENT_OTHER): Payer: Self-pay

## 2023-04-23 DIAGNOSIS — R0602 Shortness of breath: Secondary | ICD-10-CM | POA: Diagnosis not present

## 2023-04-23 DIAGNOSIS — J90 Pleural effusion, not elsewhere classified: Secondary | ICD-10-CM | POA: Diagnosis not present

## 2023-04-23 DIAGNOSIS — I5033 Acute on chronic diastolic (congestive) heart failure: Secondary | ICD-10-CM | POA: Diagnosis not present

## 2023-04-23 DIAGNOSIS — I480 Paroxysmal atrial fibrillation: Secondary | ICD-10-CM | POA: Diagnosis not present

## 2023-04-23 DIAGNOSIS — C3490 Malignant neoplasm of unspecified part of unspecified bronchus or lung: Secondary | ICD-10-CM | POA: Diagnosis not present

## 2023-04-23 LAB — CBC WITH DIFFERENTIAL/PLATELET
Abs Immature Granulocytes: 0.02 10*3/uL (ref 0.00–0.07)
Basophils Absolute: 0 10*3/uL (ref 0.0–0.1)
Basophils Relative: 0 %
Eosinophils Absolute: 0.2 10*3/uL (ref 0.0–0.5)
Eosinophils Relative: 3 %
HCT: 34.9 % — ABNORMAL LOW (ref 39.0–52.0)
Hemoglobin: 10.7 g/dL — ABNORMAL LOW (ref 13.0–17.0)
Immature Granulocytes: 0 %
Lymphocytes Relative: 21 %
Lymphs Abs: 1.4 10*3/uL (ref 0.7–4.0)
MCH: 26.2 pg (ref 26.0–34.0)
MCHC: 30.7 g/dL (ref 30.0–36.0)
MCV: 85.5 fL (ref 80.0–100.0)
Monocytes Absolute: 0.7 10*3/uL (ref 0.1–1.0)
Monocytes Relative: 10 %
Neutro Abs: 4.4 10*3/uL (ref 1.7–7.7)
Neutrophils Relative %: 66 %
Platelets: 225 10*3/uL (ref 150–400)
RBC: 4.08 MIL/uL — ABNORMAL LOW (ref 4.22–5.81)
RDW: 15.8 % — ABNORMAL HIGH (ref 11.5–15.5)
WBC: 6.8 10*3/uL (ref 4.0–10.5)
nRBC: 0 % (ref 0.0–0.2)

## 2023-04-23 LAB — GLUCOSE, CAPILLARY
Glucose-Capillary: 124 mg/dL — ABNORMAL HIGH (ref 70–99)
Glucose-Capillary: 104 mg/dL — ABNORMAL HIGH (ref 70–99)
Glucose-Capillary: 116 mg/dL — ABNORMAL HIGH (ref 70–99)

## 2023-04-23 LAB — BASIC METABOLIC PANEL
Anion gap: 10 (ref 5–15)
BUN: 15 mg/dL (ref 8–23)
CO2: 25 mmol/L (ref 22–32)
Calcium: 8.6 mg/dL — ABNORMAL LOW (ref 8.9–10.3)
Chloride: 103 mmol/L (ref 98–111)
Creatinine, Ser: 0.75 mg/dL (ref 0.61–1.24)
GFR, Estimated: >60 mL/min (ref >60)
Glucose, Bld: 117 mg/dL — ABNORMAL HIGH (ref 70–99)
Potassium: 3.1 mmol/L — ABNORMAL LOW (ref 3.5–5.1)
Sodium: 138 mmol/L (ref 135–145)

## 2023-04-23 LAB — MAGNESIUM: Magnesium: 1.6 mg/dL — ABNORMAL LOW (ref 1.7–2.4)

## 2023-04-23 MED ORDER — FUROSEMIDE 10 MG/ML IJ SOLN
40.0000 mg | Freq: Two times a day (BID) | INTRAMUSCULAR | Status: DC
  Administered 2023-04-23: 40 mg via INTRAVENOUS

## 2023-04-23 MED ORDER — SPIRONOLACTONE 25 MG PO TABS
25.0000 mg | ORAL_TABLET | Freq: Every day | ORAL | Status: DC
  Administered 2023-04-23 – 2023-04-25 (×3): 25 mg via ORAL
  Filled 2023-04-23 (×4): qty 1

## 2023-04-23 MED ORDER — INSULIN ASPART 100 UNIT/ML IJ SOLN: 0.0000 [IU] | Freq: Every day | INTRAMUSCULAR | Status: DC

## 2023-04-23 MED ORDER — ACETAMINOPHEN 325 MG PO TABS
650.0000 mg | ORAL_TABLET | ORAL | Status: DC | PRN
  Administered 2023-04-24: 650 mg via ORAL
  Filled 2023-04-23 (×2): qty 2

## 2023-04-23 MED ORDER — SODIUM CHLORIDE 0.9% FLUSH
3.0000 mL | Freq: Two times a day (BID) | INTRAVENOUS | Status: DC
  Administered 2023-04-23 – 2023-04-25 (×6): 3 mL via INTRAVENOUS

## 2023-04-23 MED ORDER — BUDESON-GLYCOPYRROL-FORMOTEROL 160-9-4.8 MCG/ACT IN AERO: 2.0000 | INHALATION_SPRAY | Freq: Two times a day (BID) | RESPIRATORY_TRACT | Status: DC

## 2023-04-23 MED ORDER — DILTIAZEM HCL ER COATED BEADS 120 MG PO CP24: 120.0000 mg | ORAL_CAPSULE | Freq: Every day | ORAL | Status: DC

## 2023-04-23 MED ORDER — AMIODARONE HCL 100 MG PO TABS
100.0000 mg | ORAL_TABLET | ORAL | Status: DC
  Administered 2023-04-23 – 2023-04-25 (×2): 100 mg via ORAL
  Filled 2023-04-23 (×4): qty 1

## 2023-04-23 MED ORDER — FLUTICASONE FUROATE-VILANTEROL 200-25 MCG/ACT IN AEPB
1.0000 | INHALATION_SPRAY | Freq: Every day | RESPIRATORY_TRACT | Status: DC
  Administered 2023-04-23 – 2023-04-25 (×3): 1 via RESPIRATORY_TRACT
  Filled 2023-04-23: qty 28

## 2023-04-23 MED ORDER — SODIUM CHLORIDE 0.9% FLUSH: 3.0000 mL | INTRAVENOUS | Status: DC | PRN

## 2023-04-23 MED ORDER — INSULIN ASPART 100 UNIT/ML IJ SOLN
0.0000 [IU] | Freq: Three times a day (TID) | INTRAMUSCULAR | Status: DC
  Administered 2023-04-23 – 2023-04-24 (×2): 1 [IU] via SUBCUTANEOUS

## 2023-04-23 MED ORDER — TAMSULOSIN HCL 0.4 MG PO CAPS
0.4000 mg | ORAL_CAPSULE | Freq: Every day | ORAL | Status: DC
  Administered 2023-04-23 – 2023-04-25 (×3): 0.4 mg via ORAL
  Filled 2023-04-23 (×4): qty 1

## 2023-04-23 MED ORDER — ENOXAPARIN SODIUM 40 MG/0.4ML IJ SOSY
40.0000 mg | PREFILLED_SYRINGE | INTRAMUSCULAR | Status: DC
Start: 2023-04-23 — End: 2023-04-23

## 2023-04-23 MED ORDER — ONDANSETRON HCL 4 MG/2ML IJ SOLN
4.0000 mg | Freq: Four times a day (QID) | INTRAMUSCULAR | Status: DC | PRN
  Administered 2023-04-24: 4 mg via INTRAVENOUS
  Filled 2023-04-23 (×3): qty 2

## 2023-04-23 MED ORDER — PANTOPRAZOLE SODIUM 40 MG PO TBEC
40.0000 mg | DELAYED_RELEASE_TABLET | Freq: Every day | ORAL | Status: DC
  Administered 2023-04-23 – 2023-04-25 (×3): 40 mg via ORAL
  Filled 2023-04-23 (×4): qty 1

## 2023-04-23 MED ORDER — MAGNESIUM SULFATE 2 GM/50ML IV SOLN
2.0000 g | Freq: Once | INTRAVENOUS | Status: AC
  Administered 2023-04-23: 2 g via INTRAVENOUS

## 2023-04-23 MED ORDER — FUROSEMIDE 10 MG/ML IJ SOLN
40.0000 mg | Freq: Two times a day (BID) | INTRAMUSCULAR | Status: DC
  Administered 2023-04-23 – 2023-04-25 (×5): 40 mg via INTRAVENOUS
  Filled 2023-04-23 (×6): qty 4

## 2023-04-23 MED ORDER — SODIUM CHLORIDE 0.9 % IV SOLN: 250.0000 mL | INTRAVENOUS | Status: DC | PRN

## 2023-04-23 MED ORDER — FLUTICASONE PROPIONATE 50 MCG/ACT NA SUSP
2.0000 | Freq: Every day | NASAL | Status: DC
  Administered 2023-04-24 – 2023-04-25 (×2): 2 via NASAL
  Filled 2023-04-23: qty 16

## 2023-04-23 MED ORDER — ATORVASTATIN CALCIUM 80 MG PO TABS
80.0000 mg | ORAL_TABLET | Freq: Every day | ORAL | Status: DC
  Administered 2023-04-23 – 2023-04-25 (×3): 80 mg via ORAL
  Filled 2023-04-23 (×4): qty 1

## 2023-04-23 MED ORDER — POTASSIUM CHLORIDE CRYS ER 20 MEQ PO TBCR
40.0000 meq | EXTENDED_RELEASE_TABLET | Freq: Once | ORAL | Status: AC
  Administered 2023-04-23: 40 meq via ORAL
  Filled 2023-04-23: qty 2

## 2023-04-23 MED ORDER — UMECLIDINIUM BROMIDE 62.5 MCG/ACT IN AEPB
1.0000 | INHALATION_SPRAY | Freq: Every day | RESPIRATORY_TRACT | Status: DC
  Administered 2023-04-23 – 2023-04-25 (×3): 1 via RESPIRATORY_TRACT
  Filled 2023-04-23: qty 7

## 2023-04-23 MED ORDER — RIVAROXABAN 20 MG PO TABS: 20.0000 mg | ORAL_TABLET | Freq: Every evening | ORAL | Status: DC

## 2023-04-23 MED ORDER — LEVETIRACETAM ER 750 MG PO TB24: 750.0000 mg | ORAL_TABLET | Freq: Every day | ORAL | Status: DC

## 2023-04-23 MED ORDER — FUROSEMIDE 10 MG/ML IJ SOLN: 20.0000 mg | Freq: Two times a day (BID) | INTRAMUSCULAR | Status: DC

## 2023-04-23 NOTE — H&P (Signed)
PCP:   Sandford Craze, NP   Chief Complaint:  Shortness of breath  HPI: This is a 82 year old male with past medical history of COPD, CAD, DM type II, atrial fibrillation on anticoagulation, HLD, PVD, Pulmonary fibrosis, h/o lung cancer sp radiation w/ recurrent malignant left pleural effusion.  He has had multiple thoracocentesis.  He presents with worsening shortness of breath.  Additionally, patient has not taken his Lasix in the last 3 days.  Prior to this he was taking it inconsistently.  Patient states he is tired of always urinating.  He endorses increased lower extremity edema, new onset orthopnea, and an occasional cough.  He denies fevers, chills, nausea or vomiting. He went to [facility] ER.  He was found to be in fluid overload and sent to [facility].  As stated the patient has known recurrent malignant left pleural effusion.  On patient's last pulmonary visit.  Discussion was had re Pleurx vs pleurodesis.  Patient states he would like to have pleurodesis done this admission.  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   COPD (chronic obstructive pulmonary disease) (HCC)    Coronary artery disease    Diabetes type 2, controlled (HCC) 07/31/2017   Dyspnea    Dysrhythmia    a fib   GERD (gastroesophageal reflux disease)    Hematuria    refuses work up or referral - understands risks of morbidity / mortality - 11/2008, 12/2008   Heme positive stool    History of hiatal hernia    History of kidney stones    Hyperlipemia    Meningioma (HCC) 10/25/2013   Follows with Dr. Coletta Memos.    Peripheral vascular disease (HCC)    Abdominal Aortic  Aneursym   Pneumonia    as a child   Radiation 09/18/15-10/25/15   left lower lobe 70.2 Gy   Seizures (HCC) 02/18/2020   Tobacco abuse    Past Surgical History:  Procedure Laterality Date   CHOLECYSTECTOMY N/A 07/23/2017   Procedure: LAPAROSCOPIC CHOLECYSTECTOMY;  Surgeon: Rodman Pickle, MD;  [facility];  Service: General;  Laterality: N/A;   CLIPPING OF ATRIAL APPENDAGE Left 05/08/2021   Procedure: CLIPPING OF ATRIAL APPENDAGE USING 45 ATRICLIP;  Surgeon: Linden Dolin, MD;  [facility] OR;  Service: Open Heart Surgery;  Laterality: Left;   COLONOSCOPY     CORONARY ARTERY BYPASS GRAFT N/A 05/08/2021   Procedure: CORONARY ARTERY BYPASS GRAFTING (CABG)X 3 USING LEFT INTERNAL MAMMARY ARTERY AND RIGHT GREATER SAPEHNOUS VEIN;  Surgeon: Linden Dolin, MD;  [facility];  Service: Open Heart Surgery;  Laterality: N/A;   ENDOVEIN HARVEST OF GREATER SAPHENOUS VEIN Right 05/08/2021   Procedure: ENDOVEIN HARVEST OF GREATER SAPHENOUS VEIN;  Surgeon: Linden Dolin, MD;  [facility];  Service: Open Heart Surgery;  Laterality: Right;   EYE SURGERY Bilateral    Cataracts removed w/ lens implant   HERNIA REPAIR     Left 36 years ago . Right inguinal hernia repair 10-01-17 Dr. Sheliah Hatch   INGUINAL HERNIA REPAIR Right 10/01/2017   Procedure: RIGHT INGUINAL HERNIA REPAIR WITH MESH;  Surgeon: Kinsinger, De Blanch,  MD;  [facility];  Service: General;  Laterality: Right;  TAP BLOCK   INSERTION OF MESH Right 10/01/2017   Procedure: INSERTION OF MESH;  Surgeon: Kinsinger, De Blanch, MD;  [facility];  Service: General;  Laterality: Right;   IR THORACENTESIS ASP PLEURAL SPACE W/IMG GUIDE  05/18/2021   IR THORACENTESIS ASP PLEURAL SPACE W/IMG GUIDE  06/07/2021   LEFT HEART CATH AND CORONARY ANGIOGRAPHY N/A 04/20/2021   Procedure: LEFT HEART CATH AND CORONARY ANGIOGRAPHY;  Surgeon: Iran Ouch, MD;  [facility];  Service: Cardiovascular;  Laterality: N/A;    TEE WITHOUT CARDIOVERSION N/A 05/08/2021   Procedure: TRANSESOPHAGEAL ECHOCARDIOGRAM (TEE);  Surgeon: Linden Dolin, MD;  Location: [facility] OR;  Service: Open Heart Surgery;  Laterality: N/A;   TONSILLECTOMY     TONSILLECTOMY     VIDEO BRONCHOSCOPY Bilateral 07/26/2015   Procedure: VIDEO BRONCHOSCOPY WITH FLUORO;  Surgeon: Nyoka Cowden, MD;  [facility];  Service: Cardiopulmonary;  Laterality: Bilateral;   VIDEO BRONCHOSCOPY WITH ENDOBRONCHIAL NAVIGATION N/A 08/23/2015   Procedure: VIDEO BRONCHOSCOPY WITH ENDOBRONCHIAL NAVIGATION;  Surgeon: Delight Ovens, MD;  [facility];  Service: Thoracic;  Laterality: N/A;   VIDEO BRONCHOSCOPY WITH ENDOBRONCHIAL ULTRASOUND N/A 08/23/2015   Procedure: VIDEO BRONCHOSCOPY WITH ENDOBRONCHIAL ULTRASOUND;  Surgeon: Delight Ovens, MD;  [facility] OR;  Service: Thoracic;  Laterality: N/A;    Medications: Prior to Admission medications   Medication Sig Start Date End Date Taking? Authorizing Provider  acetaminophen (TYLENOL) 500 MG tablet Take 1-2 tablets (500-1,000 mg total) by mouth every 6 (six) hours as needed. 05/16/21   Ardelle Balls, PA-C  albuterol (PROVENTIL) (2.5 MG/3ML) 0.083% nebulizer solution Take 3 mLs (2.5 mg total) by nebulization every 6 (six) hours as needed for wheezing or shortness of breath. 10/17/21   Charlott Holler, MD  albuterol (VENTOLIN HFA) 108 (90 Base) MCG/ACT inhaler Inhale 2 puffs into the lungs every 6 (six) hours as needed for wheezing or shortness of breath. 07/27/21   Sandford Craze, NP  amiodarone (PACERONE) 200 MG tablet Take 1/2 tablet (100mg ) by mouth three days per week (M,W,F) 10/07/22   Nahser, Deloris Ping, MD  atorvastatin (LIPITOR) 80 MG tablet Take 1 tablet (80 mg total) by mouth daily. 01/17/23   Nahser, Deloris Ping, MD  Budeson-Glycopyrrol-Formoterol (BREZTRI AEROSPHERE) 160-9-4.8 MCG/ACT AERO Inhale 2 puffs into the lungs in the morning and at bedtime. 10/17/21   Charlott Holler, MD   clotrimazole (LOTRIMIN AF) 1 % cream Apply 1 Application topically 2 (two) times daily. 04/02/23   Sandford Craze, NP  clotrimazole-betamethasone (LOTRISONE) cream Apply 1 Application topically 2 (two) times daily. 04/07/23   Stoneking, Danford Bad., MD  diltiazem (CARDIZEM CD) 120 MG 24 hr capsule Take 120 mg by mouth daily. Patient not taking: Reported on 03/26/2023    [provider]  fluticasone (FLONASE) 50 MCG/ACT nasal spray Place 2 sprays into both nostrils daily. 09/04/22   Saguier, Ramon Dredge, PA-C  furosemide (LASIX) 40 MG tablet Take 1 tablet (40 mg total) by mouth daily. 03/10/23   Sandford Craze, NP  gabapentin (NEURONTIN) 300 MG capsule Take 1 capsule (300 mg total) by mouth 3 (three) times daily. Patient taking differently: Take 300 mg by mouth 2 (two) times daily. 11/28/22   Sandford Craze, NP  glipiZIDE (GLUCOTROL XL) 2.5 MG 24 hr tablet Take 1 tablet (2.5 mg total) by mouth daily with breakfast. 04/07/23   Bradd Canary, MD  levETIRAcetam (KEPPRA  XR) 750 MG 24 hr tablet Take 1 tablet (750 mg total) by mouth at bedtime. Patient not taking: Reported on 03/27/2023 04/12/22   Windell Norfolk, MD  Multiple Vitamin (MULTIVITAMIN WITH MINERALS) TABS tablet Take 1 tablet by mouth daily in the afternoon.    [provider]  omeprazole (PRILOSEC) 40 MG capsule Take 1 capsule (40 mg total) by mouth daily. 11/05/22   Sandford Craze, NP  potassium chloride (KLOR-CON) 10 MEQ tablet TAKE 2 TABLETS BY MOUTH DAILY FOR 2 DAYS THEN REDUCE TO 1 TABLET BY MOUTH DAILY 04/05/22   Tereso Newcomer T, PA-C  rivaroxaban (XARELTO) 20 MG TABS tablet Take 1 tablet (20 mg total) by mouth every evening with supper. 11/20/22   Nahser, Deloris Ping, MD  tamsulosin (FLOMAX) 0.4 MG CAPS capsule Take 1 capsule (0.4 mg total) by mouth daily. 09/02/22 09/02/23  Sandford Craze, NP    Allergies:   Allergies  Allergen Reactions   Iodine Other (See Comments)    neck swells   Iohexol Swelling    Neck  and gland swelling per patient.   Metformin And Related Diarrhea    diarrhea    Social History:  reports that he quit smoking about 7 years ago. His smoking use included cigarettes and cigars. He has a 57.00 pack-year smoking history. He quit smokeless tobacco use about 64 years ago.  His smokeless tobacco use included chew. He reports that he does not currently use alcohol. He reports that he does not use drugs.  Family History: Family History  Problem Relation Age of Onset   Leukemia Father    Emphysema Father    Learning disabilities Son    Atrial fibrillation Son    Leukemia Other    Stroke Other     Physical Exam: Vitals:   04/22/23 2137 04/22/23 2248 04/22/23 2305 04/23/23 0037  BP:  (!) 147/93 (!) 147/93 109/67  Pulse:  (!) 59 (!) 59 68  Resp:  (!) 25 (!) 25 18  Temp: (!) 97.4 F (36.3 C) 97.7 F (36.5 C) 97.7 F (36.5 C)   TempSrc: Oral Oral Oral   SpO2:  98%  94%  Weight:  81.3 kg 81.3 kg   Height:  5\' 7"  (1.702 m) 5\' 7"  (1.702 m)     General:  Alert and oriented times three, well developed and nourished, no acute distress Eyes: PERRLA, pink conjunctiva, no scleral icterus ENT: Moist oral mucosa, neck supple, no thyromegaly Lungs: clear to ascultation, no crackles.  Very mild wheeze Cardiovascular: regular rate and rhythm, no regurgitation, no gallops, no murmurs. 1+ JVD Abdomen: soft, positive BS, non-tender, non-distended, not an acute abdomen GU: not examined Neuro: CN II - XII grossly intact, sensation intact Musculoskeletal: strength 5/5 all extremities. B/L LE pitting 2+ edema Skin: no rash, no subcutaneous crepitation, no decubitus Psych: appropriate patient  Labs on Admission:  Recent Labs    04/22/23 1623  NA 141  K 3.6  CL 104  CO2 27  GLUCOSE 97  BUN 19  CREATININE 0.89  CALCIUM 9.1    Recent Labs    04/22/23 1623  WBC 7.6  HGB 11.3*  HCT 37.4*  MCV 86.4  PLT 249     Radiological Exams on Admission: DG Chest 2 View  Result  Date: 04/22/2023 CLINICAL DATA:  Shortness of breath. EXAM: CHEST - 2 VIEW COMPARISON:  Mar 24, 2023 FINDINGS: Multiple sternal wires and vascular clips are noted. The heart size and mediastinal contours are within normal limits.  A radiopaque atrial occlusion clip is seen. Stable parenchymal scarring and/or atelectasis is seen within the mid left lung. This extends from the left hilum and is stable in appearance. A stable left pleural effusion and associated left basilar atelectasis is noted. The right lung remains clear. No pneumothorax is identified. The visualized skeletal structures are unremarkable. IMPRESSION: 1. Evidence of prior median sternotomy/CABG. 2. Stable parenchymal scarring and/or atelectasis within the mid left lung. 3. Stable left pleural effusion and associated left basilar atelectasis. Electronically Signed   By: Aram Candela M.D.   On: 04/22/2023 17:35    Assessment/Plan Present on Admission:  Acute HFpEF/noncompliance -CHF order set initiated -IV Lasix, daily weights, strict I's and O's -Defer to a.m. team cardiology consult if needed -Most recent echo 11/20/2021.  Will repeat    Chronic left-sided malignant pleural effusion -Hold Xarelto.  Patient last dose was 2 days ago 04/21/2023 -Defer to a.m. team timing of IR consult rePleurx placement based on patient's Xarelto washout time. -Will not keep patient n.p.o. as appears to be not be placed in a.m.   Persistent atrial fibrillation (HCC)/Relative bradycardia -Continue amiodarone 100 mg M/W/F w/ hold parameters d/t relative bradycardia -Diltiazem resumed   CAD/ Essential hypertension -S/p CABG in 05/2021. -Continue Lipitor, diltiazem, atorvastatin -Xarelto on hold   BPH -Resume Flomax   Seizure -Resume Keppra   Diabetes mellitus -Sliding scale insulin   Lung cancer (HCC) -Recurrence, per oncology   COPD GOLD II  -Continue pregnancy   Hyperlipidemia LDL goal <70 -Resume Lipitor  Nyasha Rahilly,  [place] 04/23/2023, 12:38 AM

## 2023-04-23 NOTE — Progress Notes (Addendum)
PROGRESS NOTE    Robert Bates  ZOX:[id number] DOB: 06-07-1941 DOA: 04/22/2023 PCP: Sandford Craze, NP  81/M with history of CAD, COPD, A-fib on Xarelto, pulmonary fibrosis, history of lung cancer status post radiation with recurrent left malignant pleural effusion presented to the ED with worsening dyspnea, usually compliant with Lasix but has not taken any in the last 3 to 4 days.  Presented to the ED with worsening dyspnea, orthopnea and lower extremity edema. -Workup noted small stable left pleural effusion. -He is followed by [facility] for recurrent pleural effusion and there was discussion about Pleurx versus pleurodesis.   Subjective: -Feels better, breathing is improving  Assessment and Plan:  Acute on chronic diastolic CHF -Last echo 1/23 with preserved EF, grade 2 diastolic dysfunction and normal RV -Poor compliance with Lasix prior to admission -Resume IV Lasix, add Aldactone -Add TED hose  Recurrent left pleural effusion -Likely combination of CHF and malignancy -Known history of lung cancer previously treated with radiation -Cytology 3/24 was negative for malignant cells however prior cytology from 7/23 noted cluster of atypical cells, raising concern for malignant effusion -After discussion with IR and niece, it appears that patient has required 1 thoracentesis this year and 2 last year, especially considering poor compliance with diuretics, leaning towards deferring Pleurx catheter placement especially since he is not requiring very frequent thoracentesis and associated risks -Encouraged compliance with diuretics  Persistent A-fib -Continue amiodarone, Cardizem -Hold Xarelto till tomorrow pending further discussions with family   CAD/ Essential hypertension -S/p CABG in 05/2021. -Continue Lipitor, diltiazem, atorvastatin -Xarelto on hold    BPH -Resume Flomax    Seizure -Resume Keppra    Diabetes mellitus -CBGs are stable    Lung cancer  (HCC) -Recurrence, per oncology    COPD GOLD II  -stable    Hyperlipidemia LDL goal <70 -Resume Lipitor  DVT prophylaxis: SCDs Code Status: Full code Family Communication: No family at bedside, called and updated niece Disposition Plan: Home likely 48 hours  Consultants:    Procedures:   Antimicrobials:    Objective: Vitals:   04/23/23 0534 04/23/23 0721 04/23/23 0755 04/23/23 1121  BP: (!) 106/58 122/73  (!) 128/92  Pulse: 75 66 61 66  Resp: 18 19 17 19   Temp: 97.8 F (36.6 C) (!) 97.5 F (36.4 C)  (!) 97.4 F (36.3 C)  TempSrc: Oral Oral  Oral  SpO2: 97% 95% 96% 97%  Weight:      Height:        Intake/Output Summary (Last 24 hours) at 04/23/2023 1308 Last data filed at 04/23/2023 1116 Gross per 24 hour  Intake 320 ml  Output 1900 ml  Net -1580 ml   Filed Weights   04/22/23 2248 04/22/23 2305 04/23/23 0347  Weight: 81.3 kg 81.3 kg 81.3 kg    Examination:  General exam: Pleasant elderly chronically ill male sitting up in bed, AAOx3 HEENT: Positive JVD CVS: S1-S2, regular rhythm Lungs: Decreased breath sounds at the bases Abdomen: Soft, nontender, bowel sounds present Extremities: 2+ edema  Skin: No rashes Psychiatry:  Mood & affect appropriate.     Data Reviewed:   CBC: Recent Labs  Lab 04/22/23 1623 04/23/23 0119  WBC 7.6 6.8  NEUTROABS  --  4.4  HGB 11.3* 10.7*  HCT 37.4* 34.9*  MCV 86.4 85.5  PLT 249 225   Basic Metabolic Panel: Recent Labs  Lab 04/22/23 1623 04/23/23 0119  NA 141 138  K 3.6 3.1*  CL 104 103  CO2 27  25  GLUCOSE 97 117*  BUN 19 15  CREATININE 0.89 0.75  CALCIUM 9.1 8.6*  MG  --  1.6*   GFR: Estimated Creatinine Clearance: 74 mL/min (by C-G formula based on SCr of 0.75 mg/dL). Liver Function Tests: No results for input(s): "AST", "ALT", "ALKPHOS", "BILITOT", "PROT", "ALBUMIN" in the last 168 hours. No results for input(s): "LIPASE", "AMYLASE" in the last 168 hours. No results for input(s): "AMMONIA" in  the last 168 hours. Coagulation Profile: No results for input(s): "INR", "PROTIME" in the last 168 hours. Cardiac Enzymes: No results for input(s): "CKTOTAL", "CKMB", "CKMBINDEX", "TROPONINI" in the last 168 hours. BNP (last 3 results) No results for input(s): "PROBNP" in the last 8760 hours. HbA1C: No results for input(s): "HGBA1C" in the last 72 hours. CBG: Recent Labs  Lab 04/23/23 0608  GLUCAP 124*   Lipid Profile: No results for input(s): "CHOL", "HDL", "LDLCALC", "TRIG", "CHOLHDL", "LDLDIRECT" in the last 72 hours. Thyroid Function Tests: No results for input(s): "TSH", "T4TOTAL", "FREET4", "T3FREE", "THYROIDAB" in the last 72 hours. Anemia Panel: No results for input(s): "VITAMINB12", "FOLATE", "FERRITIN", "TIBC", "IRON", "RETICCTPCT" in the last 72 hours. Urine analysis:    Component Value Date/Time   COLORURINE AMBER (A) 05/04/2021 1430   APPEARANCEUR HAZY (A) 05/04/2021 1430   LABSPEC 1.026 05/04/2021 1430   PHURINE 5.0 05/04/2021 1430   GLUCOSEU NEGATIVE 05/04/2021 1430   GLUCOSEU NEGATIVE 04/27/2018 1509   HGBUR SMALL (A) 05/04/2021 1430   HGBUR trace-lysed 06/08/2010 1317   BILIRUBINUR small 01/17/2023 1635   KETONESUR NEGATIVE 05/04/2021 1430   PROTEINUR Negative 01/17/2023 1635   PROTEINUR NEGATIVE 05/04/2021 1430   UROBILINOGEN 0.2 01/17/2023 1635   UROBILINOGEN 0.2 04/27/2018 1509   NITRITE negative 01/17/2023 1635   NITRITE NEGATIVE 05/04/2021 1430   LEUKOCYTESUR Trace (A) 01/17/2023 1635   LEUKOCYTESUR NEGATIVE 05/04/2021 1430   Sepsis Labs: @LABRCNTIP (procalcitonin:4,lacticidven:4)  )No results found for this or any previous visit (from the past 240 hour(s)).   Radiology Studies: DG Chest 2 View  Result Date: 04/22/2023 CLINICAL DATA:  Shortness of breath. EXAM: CHEST - 2 VIEW COMPARISON:  Mar 24, 2023 FINDINGS: Multiple sternal wires and vascular clips are noted. The heart size and mediastinal contours are within normal limits. A radiopaque  atrial occlusion clip is seen. Stable parenchymal scarring and/or atelectasis is seen within the mid left lung. This extends from the left hilum and is stable in appearance. A stable left pleural effusion and associated left basilar atelectasis is noted. The right lung remains clear. No pneumothorax is identified. The visualized skeletal structures are unremarkable. IMPRESSION: 1. Evidence of prior median sternotomy/CABG. 2. Stable parenchymal scarring and/or atelectasis within the mid left lung. 3. Stable left pleural effusion and associated left basilar atelectasis. Electronically Signed   By: Aram Candela M.D.   On: 04/22/2023 17:35     Scheduled Meds:  amiodarone  100 mg Oral Q M,W,F   atorvastatin  80 mg Oral Daily   diltiazem  120 mg Oral Daily   fluticasone  2 spray Each Nare Daily   fluticasone furoate-vilanterol  1 puff Inhalation Daily   furosemide  40 mg Intravenous BID   insulin aspart  0-5 Units Subcutaneous QHS   insulin aspart  0-9 Units Subcutaneous TID WC   levETIRAcetam  750 mg Oral Daily   pantoprazole  40 mg Oral Daily   sodium chloride flush  3 mL Intravenous Q12H   tamsulosin  0.4 mg Oral Daily   umeclidinium bromide  1 puff Inhalation  Daily   Continuous Infusions:  sodium chloride       LOS: 0 days    Time spent:    Zannie Cove, MD [facility]   04/23/2023, 1:08 PM

## 2023-04-23 NOTE — Care Management Obs Status (Signed)
MEDICARE OBSERVATION STATUS NOTIFICATION   Patient Details  Name: Mikelle Minear MRN: [id number] Date of Birth: [birth year]   Medicare Observation Status Notification Given:  Yes    Leone Haven, RN 04/23/2023, 3:55 PM

## 2023-04-23 NOTE — Progress Notes (Signed)
Mobility Specialist Progress Note:   04/23/23 1000  Mobility  Activity Ambulated with assistance in hallway  Level of Assistance Contact guard assist, steadying assist  Assistive Device  (Hand Held Assist)  Distance Ambulated (ft) 500 ft  Activity Response Tolerated well  Mobility Referral Yes  $Mobility charge 1 Mobility  Mobility Specialist Start Time (ACUTE ONLY) 1015  Mobility Specialist Stop Time (ACUTE ONLY) 1025  Mobility Specialist Time Calculation (min) (ACUTE ONLY) 10 min    During Mobility: 99 SpO2 RA  Pt received on EOB, agreeable to mobility. Ambulated in hallway w/ CG, no AD. Asymptomatic throughout. Pt left on EOB with call bell and all needs met.  [place]ante Earlene Plater Mobility Specialist Please contact via Special educational needs teacher or Rehab office at [phone]

## 2023-04-23 NOTE — Consult Note (Signed)
   [facility] Inpatient Consult   04/23/2023  Amaad Eliassen Feb 09, 1941 [id number]  [facility] [[facility]]  Accountable Care Organization [ACO] Patient: [employer] Medicare  Primary Care Provider:  Sandford Craze, NP with [place] [facility] which is listed to provide the transition of care follow up.   Patient screened for hospitalization with noted SOB with exertion for post hospital transition for care coordination.  Review of patient's electronic medical record reveals patient is from home, follows up with PCP. Ambulates with inpatient Mobility team noted. Noted for not taking diuretics for a few days.  Plan:  Continue to follow progress and disposition to assess for post hospital community care coordination/management needs.  Referral request for community care coordination: following  Of note, [facility] Care Management/Population Health does not replace or interfere with any arrangements made by the Inpatient Transition of Care team.  For questions contact:   Charlesetta Shanks, RN BSN CCM [facility]  [phone] business mobile phone Toll free office [phone]  *Concierge Line  [phone] Fax number: [phone] [email or web address]

## 2023-04-23 NOTE — TOC Initial Note (Addendum)
Transition of Care [facility]) - Initial/Assessment Note    Patient Details  Name: Robert Bates MRN: [id number] Date of Birth: Sep 10, 1941  Transition of Care [facility]) CM/SW Contact:    Leone Haven, RN Phone Number: 04/23/2023, 2:43 PM  Clinical Narrative:                 From home with his son, he has PCP and insurance on file.  He states he has a neb machine at home, he has no HH services in place at this time.  He states his niece , Steward Drone, is his support system,  she or either his son or SIL will transport him home at dc.  He gets his medications from the [facility], self ambulatory pta. Patient states he would like for the Chaplain to come by tomorrow to assist with HPOA with his niece.   NCM will inform Chaplain of this information for patient.   Expected Discharge Plan: Home/Self Care Barriers to Discharge: Continued Medical Work up   Patient Goals and CMS Choice Patient states their goals for this hospitalization and ongoing recovery are:: return home   Choice offered to / list presented to : NA      Expected Discharge Plan and Services In-house Referral: NA Discharge Planning Services: CM Consult Post Acute Care Choice: NA Living arrangements for the past 2 months: Single Family Home                 DME Arranged: N/A DME Agency: NA       HH Arranged: NA          Prior Living Arrangements/Services Living arrangements for the past 2 months: Single Family Home Lives with:: Adult Children Patient language and need for interpreter reviewed:: Yes Do you feel safe going back to the place where you live?: Yes      Need for Family Participation in Patient Care: Yes (Comment) Care giver support system in place?: Yes (comment) Current home services: DME (neb machine) Criminal Activity/Legal Involvement Pertinent to Current Situation/Hospitalization: No - Comment as needed  Activities of Daily Living Home Assistive Devices/Equipment: None ADL Screening  (condition at time of admission) Patient's cognitive ability adequate to safely complete daily activities?: Yes Is the patient deaf or have difficulty hearing?: No Does the patient have difficulty seeing, even when wearing glasses/contacts?: No Does the patient have difficulty concentrating, remembering, or making decisions?: No Patient able to express need for assistance with ADLs?: Yes Does the patient have difficulty dressing or bathing?: No Independently performs ADLs?: Yes (appropriate for developmental age) Does the patient have difficulty walking or climbing stairs?: No Weakness of Legs: None Weakness of Arms/Hands: None  Permission Sought/Granted                  Emotional Assessment Appearance:: Appears stated age Attitude/Demeanor/Rapport: Engaged Affect (typically observed): Appropriate Orientation: : Oriented to Self, Oriented to Place, Oriented to  Time, Oriented to Situation Alcohol / Substance Use: Not Applicable Psych Involvement: No (comment)  Admission diagnosis:  CHF (congestive heart failure) (HCC) [I50.9] Chest tightness [R07.89] SOB (shortness of breath) [R06.02] Pleural effusion [J90] Exertional shortness of breath [R06.02] Patient Active Problem List   Diagnosis Date Noted   Exertional shortness of breath 04/23/2023   SOB (shortness of breath) 04/23/2023   CHF (congestive heart failure) (HCC) 04/22/2023   Balanitis 04/07/2023   Phimosis 03/24/2023   Chest pain 03/24/2023   Grief reaction 01/17/2023   Bilateral impacted cerumen 11/05/2022   Urinary  retention 11/05/2022   Tinnitus of both ears 08/16/2022   Musculoskeletal pain 07/17/2022   Pleural effusion 05/20/2022   Aortic dilatation (HCC) 05/13/2022   Dyspnea on exertion 05/06/2022   Thoracic aortic aneurysm (HCC) 04/04/2022   Lower urinary tract symptoms (LUTS) 03/15/2022   Thoracic radiculopathy 03/15/2022   Chronic heart failure with preserved ejection fraction (HCC) 03/15/2022    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/15/2022   Unilateral inguinal hernia without obstruction or gangrene 01/15/2022   COPD exacerbation (HCC) 10/03/2021   Persistent atrial fibrillation (HCC) 05/28/2021   Secondary hypercoagulable state (HCC) 05/28/2021   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   Coronary artery disease 05/08/2021   COVID-19 virus infection 04/23/2021   Erectile dysfunction 04/11/2021   Head trauma 03/07/2021   Left leg pain 04/07/2020   Benign prostatic hyperplasia with nocturia 03/08/2020   Seizures (HCC) 02/18/2020   PAF (paroxysmal atrial fibrillation) (HCC) 08/15/2017   Diabetes type 2, controlled (HCC) 07/31/2017   COPD GOLD II  04/03/2017   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) 09/06/2015   Lung cancer (HCC) 11/07/2014   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   Essential hypertension 04/29/2014   Meningioma (HCC) 10/25/2013   Low back pain 10/25/2013   Osteoarthritis 08/18/2012   Bronchitis with bronchospasm 08/14/2011   Atypical chest pain 08/14/2011   KERATOSIS 10/09/2010   SCIATICA, RIGHT 10/09/2010   UNSPECIFIED HEARING LOSS 05/21/2010   Hyperlipidemia LDL goal <70 05/14/2010   Leukocytosis 05/14/2010   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   RENAL CYST, RIGHT 02/05/2010   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HCC) 01/29/2010   LIPOMA 11/17/2009   MICROSCOPIC HEMATURIA 08/11/2008   GERD 07/26/2008   DERMOID CYST 07/25/2008   PCP:  Sandford Craze, NP Pharmacy:   [facility] [place] [facility] [address] [place] [address] Phone: [phone] Fax: [phone]  [facility] [place], [place] - [address] [place] [address] Phone: [phone] Fax: [phone]     Social Determinants of Health (SDOH) Social History: SDOH Screenings   Food Insecurity: No Food Insecurity (04/22/2023)  Housing: Low Risk  (04/22/2023)  Transportation Needs: No Transportation Needs (04/22/2023)   Utilities: Not At Risk (04/22/2023)  Alcohol Screen: Low Risk  (10/02/2022)  Depression (PHQ2-9): Low Risk  (10/02/2022)  Financial Resource Strain: Medium Risk (01/03/2023)  Physical Activity: Insufficiently Active (08/05/2021)  Social Connections: Moderately Isolated (05/13/2022)  Stress: No Stress Concern Present (10/01/2021)  Tobacco Use: Medium Risk (04/22/2023)   SDOH Interventions:     Readmission Risk Interventions    05/15/2021    3:55 PM  Readmission Risk Prevention Plan  Transportation Screening Complete  PCP or Specialist Appt within 5-7 Days Complete  Home Care Screening Complete  Medication Review (RN CM) Complete

## 2023-04-24 DIAGNOSIS — Z87442 Personal history of urinary calculi: Secondary | ICD-10-CM | POA: Diagnosis not present

## 2023-04-24 DIAGNOSIS — Z7901 Long term (current) use of anticoagulants: Secondary | ICD-10-CM | POA: Diagnosis not present

## 2023-04-24 DIAGNOSIS — I110 Hypertensive heart disease with heart failure: Secondary | ICD-10-CM | POA: Diagnosis present

## 2023-04-24 DIAGNOSIS — T501X6A Underdosing of loop [high-ceiling] diuretics, initial encounter: Secondary | ICD-10-CM | POA: Diagnosis present

## 2023-04-24 DIAGNOSIS — K219 Gastro-esophageal reflux disease without esophagitis: Secondary | ICD-10-CM | POA: Diagnosis present

## 2023-04-24 DIAGNOSIS — Z951 Presence of aortocoronary bypass graft: Secondary | ICD-10-CM | POA: Diagnosis not present

## 2023-04-24 DIAGNOSIS — J8410 Pulmonary fibrosis, unspecified: Secondary | ICD-10-CM | POA: Diagnosis present

## 2023-04-24 DIAGNOSIS — Z91148 Patient's other noncompliance with medication regimen for other reason: Secondary | ICD-10-CM | POA: Diagnosis not present

## 2023-04-24 DIAGNOSIS — Z86011 Personal history of benign neoplasm of the brain: Secondary | ICD-10-CM | POA: Diagnosis not present

## 2023-04-24 DIAGNOSIS — I509 Heart failure, unspecified: Secondary | ICD-10-CM | POA: Diagnosis present

## 2023-04-24 DIAGNOSIS — G40909 Epilepsy, unspecified, not intractable, without status epilepticus: Secondary | ICD-10-CM | POA: Diagnosis present

## 2023-04-24 DIAGNOSIS — Z923 Personal history of irradiation: Secondary | ICD-10-CM | POA: Diagnosis not present

## 2023-04-24 DIAGNOSIS — N400 Enlarged prostate without lower urinary tract symptoms: Secondary | ICD-10-CM | POA: Diagnosis present

## 2023-04-24 DIAGNOSIS — J449 Chronic obstructive pulmonary disease, unspecified: Secondary | ICD-10-CM | POA: Diagnosis present

## 2023-04-24 DIAGNOSIS — Z7951 Long term (current) use of inhaled steroids: Secondary | ICD-10-CM | POA: Diagnosis not present

## 2023-04-24 DIAGNOSIS — Z79899 Other long term (current) drug therapy: Secondary | ICD-10-CM | POA: Diagnosis not present

## 2023-04-24 DIAGNOSIS — I5033 Acute on chronic diastolic (congestive) heart failure: Secondary | ICD-10-CM | POA: Diagnosis present

## 2023-04-24 DIAGNOSIS — Z7984 Long term (current) use of oral hypoglycemic drugs: Secondary | ICD-10-CM | POA: Diagnosis not present

## 2023-04-24 DIAGNOSIS — Z87891 Personal history of nicotine dependence: Secondary | ICD-10-CM | POA: Diagnosis not present

## 2023-04-24 DIAGNOSIS — I2510 Atherosclerotic heart disease of native coronary artery without angina pectoris: Secondary | ICD-10-CM | POA: Diagnosis present

## 2023-04-24 DIAGNOSIS — C3490 Malignant neoplasm of unspecified part of unspecified bronchus or lung: Secondary | ICD-10-CM | POA: Diagnosis present

## 2023-04-24 DIAGNOSIS — J910 Malignant pleural effusion: Secondary | ICD-10-CM | POA: Diagnosis present

## 2023-04-24 DIAGNOSIS — E1151 Type 2 diabetes mellitus with diabetic peripheral angiopathy without gangrene: Secondary | ICD-10-CM | POA: Diagnosis present

## 2023-04-24 DIAGNOSIS — I4819 Other persistent atrial fibrillation: Secondary | ICD-10-CM | POA: Diagnosis present

## 2023-04-24 DIAGNOSIS — E785 Hyperlipidemia, unspecified: Secondary | ICD-10-CM | POA: Diagnosis present

## 2023-04-24 LAB — CBC
HCT: 39.3 % (ref 39.0–52.0)
Hemoglobin: 12.1 g/dL — ABNORMAL LOW (ref 13.0–17.0)
MCH: 26.4 pg (ref 26.0–34.0)
MCHC: 30.8 g/dL (ref 30.0–36.0)
MCV: 85.6 fL (ref 80.0–100.0)
Platelets: 279 10*3/uL (ref 150–400)
RBC: 4.59 MIL/uL (ref 4.22–5.81)
RDW: 15.9 % — ABNORMAL HIGH (ref 11.5–15.5)
WBC: 7.2 10*3/uL (ref 4.0–10.5)
nRBC: 0 % (ref 0.0–0.2)

## 2023-04-24 LAB — GLUCOSE, CAPILLARY
Glucose-Capillary: 104 mg/dL — ABNORMAL HIGH (ref 70–99)
Glucose-Capillary: 134 mg/dL — ABNORMAL HIGH (ref 70–99)
Glucose-Capillary: 89 mg/dL (ref 70–99)
Glucose-Capillary: 158 mg/dL — ABNORMAL HIGH (ref 70–99)

## 2023-04-24 LAB — BASIC METABOLIC PANEL
Anion gap: 10 (ref 5–15)
BUN: 14 mg/dL (ref 8–23)
CO2: 27 mmol/L (ref 22–32)
Calcium: 8.7 mg/dL — ABNORMAL LOW (ref 8.9–10.3)
Chloride: 103 mmol/L (ref 98–111)
Creatinine, Ser: 0.89 mg/dL (ref 0.61–1.24)
GFR, Estimated: >60 mL/min (ref >60)
Glucose, Bld: 109 mg/dL — ABNORMAL HIGH (ref 70–99)
Potassium: 3.5 mmol/L (ref 3.5–5.1)
Sodium: 140 mmol/L (ref 135–145)

## 2023-04-24 MED ORDER — POTASSIUM CHLORIDE 20 MEQ PO PACK
40.0000 meq | PACK | Freq: Once | ORAL | Status: AC
  Administered 2023-04-24: 40 meq via ORAL
  Filled 2023-04-24: qty 2

## 2023-04-24 MED ORDER — RIVAROXABAN 20 MG PO TABS
20.0000 mg | ORAL_TABLET | Freq: Every day | ORAL | Status: DC
  Administered 2023-04-24: 20 mg via ORAL
  Filled 2023-04-24: qty 1

## 2023-04-24 NOTE — Progress Notes (Signed)
Patient was given orders that he could shower but never did because they told him he may leave tomorrow. So he said he would wait until then

## 2023-04-24 NOTE — Progress Notes (Signed)
PROGRESS NOTE    Robert Bates  ZOX:[id number] DOB: [birth year] DOA: 04/22/2023 PCP: Sandford Craze, NP  81/M with history of CAD, COPD, A-fib on Xarelto, pulmonary fibrosis, history of lung cancer status post radiation with recurrent left malignant pleural effusion presented to the ED with worsening dyspnea, usually compliant with Lasix but has not taken any in the last 3 to 4 days.  Presented to the ED with worsening dyspnea, orthopnea and lower extremity edema. -Workup noted small stable left pleural effusion. -He is followed by [facility] for recurrent pleural effusion and there was discussion about Pleurx versus pleurodesis.   Subjective: -Feels better, breathing overall improving, still has some swelling  Assessment and Plan:  Acute on chronic diastolic CHF -Last echo 1/23 with preserved EF, grade 2 diastolic dysfunction and normal RV -Poor compliance with Lasix prior to admission -Improving on diuretics, 2.7 L negative, continue IV Lasix 1 more day, started on Aldactone  -History of yeast infection with SGLT2i -Continue TED hose  -Monitor I's/O, BMP in a.m.  Recurrent left pleural effusion -Likely combination of CHF and malignancy -Known history of lung cancer previously treated with radiation -Cytology 3/24 was negative for malignant cells however prior cytology from 7/23 noted cluster of atypical cells, raising concern for malignant effusion -After discussion with IR and niece, it appears that patient has required 1 thoracentesis this year and 2 last year, especially considering poor compliance with diuretics this time, after discussion with patient and his niece Steward Drone we decided to defer Pleurx catheter placement especially since he is not requiring very frequent thoracentesis and associated risks -Encouraged compliance with diuretics  Persistent A-fib -Continue amiodarone, Cardizem -Resume xarelto, no further plan for Pleurx catheter placement   CAD/ Essential  hypertension -S/p CABG in 05/2021. -Continue Lipitor, diltiazem, atorvastatin -Xarelto resumed    BPH -Resume Flomax    Seizure disorder -No longer taking Keppra, was discontinued prior to admission    Diabetes mellitus -CBGs are stable    Lung cancer (HCC) -Recurrence, per oncology    COPD GOLD II  -stable    Hyperlipidemia LDL goal <70 -Resume Lipitor  DVT prophylaxis: SCDs Code Status: Full code Family Communication: No family at bedside, called and updated niece Disposition Plan: Home likely 1 to 2-day days  Consultants:    Procedures:   Antimicrobials:    Objective: Vitals:   04/24/23 0505 04/24/23 0557 04/24/23 0731 04/24/23 0747  BP: 112/72   117/70  Pulse:   64 73  Resp:   18 18  Temp: (!) 97.5 F (36.4 C)   (!) 97.5 F (36.4 C)  TempSrc: Oral   Oral  SpO2: 94%   92%  Weight:  77.3 kg    Height:        Intake/Output Summary (Last 24 hours) at 04/24/2023 1006 Last data filed at 04/24/2023 0844 Gross per 24 hour  Intake 360 ml  Output 1980 ml  Net -1620 ml   Filed Weights   04/22/23 2305 04/23/23 0347 04/24/23 0557  Weight: 81.3 kg 81.3 kg 77.3 kg    Examination:  General exam: Pleasant elderly chronically ill male sitting up in bed, AAOx3 HEENT: Positive JVD CVS: S1-S2, regular rhythm Lungs: Decreased breath sounds at the bases Abdomen: Soft, nontender, bowel sounds present Extremities: 1+ edema, TED hose on Skin: No rashes Psychiatry:  Mood & affect appropriate.     Data Reviewed:   CBC: Recent Labs  Lab 04/22/23 1623 04/23/23 0119 04/24/23 0047  WBC 7.6 6.8 7.2  NEUTROABS  --  4.4  --   HGB 11.3* 10.7* 12.1*  HCT 37.4* 34.9* 39.3  MCV 86.4 85.5 85.6  PLT 249 225 279   Basic Metabolic Panel: Recent Labs  Lab 04/22/23 1623 04/23/23 0119 04/24/23 0047  NA 141 138 140  K 3.6 3.1* 3.5  CL 104 103 103  CO2 27 25 27   GLUCOSE 97 117* 109*  BUN 19 15 14   CREATININE 0.89 0.75 0.89  CALCIUM 9.1 8.6* 8.7*  MG  --  1.6*   --    GFR: Estimated Creatinine Clearance: 60.9 mL/min (by C-G formula based on SCr of 0.89 mg/dL). Liver Function Tests: No results for input(s): "AST", "ALT", "ALKPHOS", "BILITOT", "PROT", "ALBUMIN" in the last 168 hours. No results for input(s): "LIPASE", "AMYLASE" in the last 168 hours. No results for input(s): "AMMONIA" in the last 168 hours. Coagulation Profile: No results for input(s): "INR", "PROTIME" in the last 168 hours. Cardiac Enzymes: No results for input(s): "CKTOTAL", "CKMB", "CKMBINDEX", "TROPONINI" in the last 168 hours. BNP (last 3 results) No results for input(s): "PROBNP" in the last 8760 hours. HbA1C: No results for input(s): "HGBA1C" in the last 72 hours. CBG: Recent Labs  Lab 04/23/23 0608 04/23/23 1551 04/23/23 2120 04/24/23 0620  GLUCAP 124* 104* 116* 104*   Lipid Profile: No results for input(s): "CHOL", "HDL", "LDLCALC", "TRIG", "CHOLHDL", "LDLDIRECT" in the last 72 hours. Thyroid Function Tests: No results for input(s): "TSH", "T4TOTAL", "FREET4", "T3FREE", "THYROIDAB" in the last 72 hours. Anemia Panel: No results for input(s): "VITAMINB12", "FOLATE", "FERRITIN", "TIBC", "IRON", "RETICCTPCT" in the last 72 hours. Urine analysis:    Component Value Date/Time   COLORURINE AMBER (A) 05/04/2021 1430   APPEARANCEUR HAZY (A) 05/04/2021 1430   LABSPEC 1.026 05/04/2021 1430   PHURINE 5.0 05/04/2021 1430   GLUCOSEU NEGATIVE 05/04/2021 1430   GLUCOSEU NEGATIVE 04/27/2018 1509   HGBUR SMALL (A) 05/04/2021 1430   HGBUR trace-lysed 06/08/2010 1317   BILIRUBINUR small 01/17/2023 1635   KETONESUR NEGATIVE 05/04/2021 1430   PROTEINUR Negative 01/17/2023 1635   PROTEINUR NEGATIVE 05/04/2021 1430   UROBILINOGEN 0.2 01/17/2023 1635   UROBILINOGEN 0.2 04/27/2018 1509   NITRITE negative 01/17/2023 1635   NITRITE NEGATIVE 05/04/2021 1430   LEUKOCYTESUR Trace (A) 01/17/2023 1635   LEUKOCYTESUR NEGATIVE 05/04/2021 1430   Sepsis  Labs: @LABRCNTIP (procalcitonin:4,lacticidven:4)  )No results found for this or any previous visit (from the past 240 hour(s)).   Radiology Studies: DG Chest 2 View  Result Date: 04/22/2023 CLINICAL DATA:  Shortness of breath. EXAM: CHEST - 2 VIEW COMPARISON:  Mar 24, 2023 FINDINGS: Multiple sternal wires and vascular clips are noted. The heart size and mediastinal contours are within normal limits. A radiopaque atrial occlusion clip is seen. Stable parenchymal scarring and/or atelectasis is seen within the mid left lung. This extends from the left hilum and is stable in appearance. A stable left pleural effusion and associated left basilar atelectasis is noted. The right lung remains clear. No pneumothorax is identified. The visualized skeletal structures are unremarkable. IMPRESSION: 1. Evidence of prior median sternotomy/CABG. 2. Stable parenchymal scarring and/or atelectasis within the mid left lung. 3. Stable left pleural effusion and associated left basilar atelectasis. Electronically Signed   By: Aram Candela M.D.   On: 04/22/2023 17:35     Scheduled Meds:  amiodarone  100 mg Oral Q M,W,F   atorvastatin  80 mg Oral Daily   fluticasone  2 spray Each Nare Daily   fluticasone furoate-vilanterol  1 puff Inhalation Daily   furosemide  40 mg Intravenous BID   insulin aspart  0-5 Units Subcutaneous QHS   insulin aspart  0-9 Units Subcutaneous TID WC   pantoprazole  40 mg Oral Daily   sodium chloride flush  3 mL Intravenous Q12H   spironolactone  25 mg Oral Daily   tamsulosin  0.4 mg Oral Daily   umeclidinium bromide  1 puff Inhalation Daily   Continuous Infusions:  sodium chloride       LOS: 0 days    Time spent:    Zannie Cove, MD [facility]   04/24/2023, 10:06 AM

## 2023-04-24 NOTE — Progress Notes (Signed)
Chaplain responded to a consult request for Advance Directive education. Robert Bates shared that his niece was on the way from [place]. Chaplain informed pt that she does not need to be present even if he elects to place her on the document, but our [place] team would be glad to return if she desires further information. Chaplain also listened reflectively as Robert Bates shared that in March he lost his wife of 44 years. He has recovered from many conditions, but is sad to be sick again and overwhelmed by the loneliness of living life alone. He longs to find another companion.   Chaplain provided the Advance Directive packet as well as education on Advance Directives-documents an individual completes to communicate their health care directions in advance of a time when they may need them. Chaplain informed pt the documents which may be completed here in the hospital are the Living Will and Health Care Power of [place].   Chaplain informed that the Health Care Power of [place] is a legal document in which an individual names another person, their Health Care Agent, to make health care decisions when the individual is not able to make them for themselves. The Health Care Agent's function can be temporary or permanent depending on the pt's ability to make and communicate those decisions independently. Chaplain informed pt in the absence of a Health Care Power of [place], the state of [place] directs health care providers to look to the following individuals in the order listed: legal guardian; an attorney?in?fact under a general power of attorney (POA) if that POA includes the right to make health care decisions; a husband or wife; a majority of parents and adult children; a majority of adult brothers and sisters; or an individual who has an established relationship with you, who is acting in good faith and who can convey your wishes.  If none of these person are available or willing to make medical decisions  on a patient's behalf, the law allows the patient's doctor to make decisions for them as long as another doctor agrees with those decisions.  Chaplain also informed the patient that the Health Care agent has no decision-making authority over any affairs other than those related to his or her medical care.   The chaplain further educated the pt that a Living Will is a legal document that allows an individual to state his or her desire not to receive life-prolonging measures in the event that they have a condition that is incurable and will result in their death in a short period of time; they are unconscious, and doctors are confident that they will not regain consciousness; and/or they have advanced dementia or other substantial and irreversible loss of mental function. The chaplain informed pt that life-prolonging measures are medical treatments that would only serve to postpone death, including breathing machines, kidney dialysis, antibiotics, artificial nutrition and hydration (tube feeding), and similar forms of treatment and that if an individual is able to express their wishes, they may also make them known without the use of a Living Will, but in the event that an individual is not able to express their wishes themselves, a Living Will allows medical providers and the pt's family and friends ensure that they are not making decisions on the pt's behalf, but rather serving as the pt's voice to convey decisions the pt has already made.   The patient is aware that the decision to create an advance directive is theirs alone and they may chose not  to complete the documents or may chose to complete one portion or both.  The patient was informed that they can revoke the documents at any time by striking through them and writing void or by completing new documents, but that it is also advisable that the individual verbally notify interested parties that their wishes have changed.  They are also aware that the  document must be signed in the presence of a notary public and two witnesses and that this can be done while the patient is still admitted to the hospital or after discharge in the community. If they decide to complete Advance Directives after being discharged from the hospital, they have been advised to notify all interested parties and to provide those documents to their physicians and loved ones in addition to bringing them to the hospital in the event of another hospitalization.   The chaplain informed the pt that if they desire to proceed with completing Advance Directive Documentation while they are still admitted, notary services are typically available at [facility] between the hours of 1:00 and 3:30 Monday-Thursday.    When the patient is ready to have these documents completed, the patient should request that their nurse place a spiritual care consult and indicate that the patient is ready to have their advance directives notarized so that arrangements for witnesses and notary public can be made.  Please page spiritual care if the patient desires further education or has questions.     Maryanna Shape. Carley Hammed, M.Div. [facility] Chaplain Pager [phone] Office [phone]       04/24/23 1130  Spiritual Encounters  Type of Visit Initial  Care provided to: Patient  Referral source Social worker/Care management/TOC  Reason for visit Advance directives  Spiritual Framework  Patient Stress Factors Loss  Goals  Self/Personal Goals Find a partner  Interventions  Spiritual Care Interventions Made Compassionate presence;Reflective listening;Decision-making support/facilitation  Intervention Outcomes  Outcomes Connection to spiritual care;Awareness of support  Advance Directives (For Healthcare)  Does Patient Have a Medical Advance Directive? No  Would patient like information on creating a medical advance directive? Yes (Inpatient - patient defers creating a medical advance  directive at this time - Information given)

## 2023-04-24 NOTE — Progress Notes (Signed)
Mobility Specialist Progress Note:   04/24/23 1200  Mobility  Activity Ambulated with assistance in hallway  Level of Assistance Contact guard assist, steadying assist  Assistive Device None  Distance Ambulated (ft) 500 ft  Activity Response Tolerated well  Mobility Referral Yes  $Mobility charge 1 Mobility  Mobility Specialist Start Time (ACUTE ONLY) 1140  Mobility Specialist Stop Time (ACUTE ONLY) 1155  Mobility Specialist Time Calculation (min) (ACUTE ONLY) 15 min    Pre Mobility: 76 HR,   During Mobility: 82 HR,   Post Mobility:  79 HR,    Pt received in bed, agreeable to mobility. Ambulated in hallway w/ CG, no AD. Asymptomatic throughout. Pt left on EOB with lunch, call bell, and family present.  D'Vante Earlene Plater Mobility Specialist Please contact via Special educational needs teacher or Rehab office at [phone]

## 2023-04-24 NOTE — Progress Notes (Signed)
Heart Failure Navigator Progress Note  Assessed for Heart & Vascular TOC clinic readiness.  Patient does not meet criteria due to EF 55-60%, Chronic Malignant Pleural effusions. No HF TOC per Dr. Jomarie Longs. .   Navigator will sign off at this time.   Rhae Hammock, BSN, Scientist, clinical (histocompatibility and immunogenetics) Only

## 2023-04-25 ENCOUNTER — Other Ambulatory Visit (HOSPITAL_BASED_OUTPATIENT_CLINIC_OR_DEPARTMENT_OTHER): Payer: Self-pay

## 2023-04-25 ENCOUNTER — Other Ambulatory Visit (HOSPITAL_COMMUNITY): Payer: Self-pay

## 2023-04-25 LAB — BASIC METABOLIC PANEL
Anion gap: 10 (ref 5–15)
BUN: 12 mg/dL (ref 8–23)
CO2: 27 mmol/L (ref 22–32)
Calcium: 8.6 mg/dL — ABNORMAL LOW (ref 8.9–10.3)
Chloride: 103 mmol/L (ref 98–111)
Creatinine, Ser: 0.98 mg/dL (ref 0.61–1.24)
GFR, Estimated: >60 mL/min (ref >60)
Glucose, Bld: 100 mg/dL — ABNORMAL HIGH (ref 70–99)
Potassium: 4 mmol/L (ref 3.5–5.1)
Sodium: 140 mmol/L (ref 135–145)

## 2023-04-25 LAB — GLUCOSE, CAPILLARY: Glucose-Capillary: 104 mg/dL — ABNORMAL HIGH (ref 70–99)

## 2023-04-25 MED ORDER — SPIRONOLACTONE 25 MG PO TABS
25.0000 mg | ORAL_TABLET | Freq: Every day | ORAL | 0 refills | Status: DC
  Filled 2023-04-25: qty 30, 30d supply, fill #0

## 2023-04-25 NOTE — Discharge Summary (Signed)
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   Hyperlipidemia LDL goal <70   Essential hypertension   Lung cancer (HCC)   COPD GOLD II    Diabetes type 2, controlled (HCC)   PAF (paroxysmal atrial fibrillation) (HCC)   Seizures (HCC)   Persistent atrial fibrillation (HCC)   Pleural effusion   Exertional shortness of breath   SOB (shortness of breath)   Discharge Condition: Improved  Diet recommendation: Low-sodium, heart healthy  Filed Weights   04/23/23 0347 04/24/23 0557 04/25/23 0500  Weight: 81.3 kg 77.3 kg 77.2 kg    History of present illness:  81/M with history of CAD, COPD, A-fib on Xarelto, pulmonary fibrosis, history of lung cancer status post radiation with recurrent left malignant pleural effusion presented to the ED with worsening dyspnea, usually compliant with Lasix but has not taken any in the last 3 to 4 days.  Presented to the ED with worsening dyspnea, orthopnea and lower extremity edema. -Workup noted small stable left pleural effusion. -He is followed by [facility] for recurrent pleural effusion and there was discussion about Pleurx versus pleurodesis.  Hospital Course:  Acute on chronic diastolic CHF -Last echo 1/23 with preserved EF, grade 2 diastolic dysfunction and normal RV -Did not take diuretics 3 to 4 days prior to admission -Improving on diuretics, weight is down 12 LB  -Started on Aldactone as well, transitioned back to oral Lasix 40 Mg daily  -History of yeast infection with SGLT2i -Discharged home in stable condition, advised compliance with  diuretics and low-salt diet   Recurrent left pleural effusion -Likely combination of CHF and malignancy -Known history of lung cancer previously treated with radiation -Cytology 3/24 was negative for malignant cells however prior cytology from 7/23 noted cluster of atypical cells, raising concern for malignant effusion -Left pleural effusion this time around was small -After discussion with IR and niece, it appears that patient has required 1 thoracentesis this year and 2 last year, especially considering poor compliance with diuretics this time, we discussed with patient and his niece Steward Drone and decided to defer Pleurx catheter placement especially since he is not requiring very frequent thoracentesis and associated risks -Encouraged compliance with diuretics  Lung cancer (HCC) Malignant pleural effusion -Recurrence, follow-up with Dr. Shirline Frees   Persistent A-fib -Continue amiodarone, Cardizem -Continue Xarelto    CAD/ Essential hypertension -S/p CABG in 05/2021. -Continue Lipitor, diltiazem, atorvastatin -Xarelto resumed    BPH -Resume Flomax    Seizure disorder -No longer taking Keppra, was discontinued prior to admission    Diabetes mellitus -CBGs are stable    COPD GOLD II  -stable    Hyperlipidemia LDL goal <70 -Resume Lipitor  Discharge Exam: Vitals:   04/25/23 0744 04/25/23 0815  BP:  110/87  Pulse: 74 83  Resp:  18  Temp:  98.4 F (36.9 C)  SpO2:     General exam: Pleasant elderly chronically ill male sitting up in bed, AAOx3 HEENT: Positive JVD CVS: S1-S2, regular rhythm Lungs: Decreased breath sounds at the bases Abdomen: Soft, nontender, bowel sounds present Extremities: 1+ edema, TED hose on Skin: No rashes Psychiatry:  Mood & affect appropriate.  Discharge Instructions   Discharge Instructions     AMB Referral to Community Care Coordinaton (ACO Patients)   Complete by: As directed    Hospital Follow up referral/Care Coordination:  Closer  follow up for disease management needs, rising risk for unplanned readmission  Primary Care Provider:  Sandford Craze, NP, [facility] SW  Insurance plan: [employer]  Please assign to [facility] RN Care Coordinator for post hospital care coordination for readmission prevention follow up calls and assess for further needs. Medication support follow up on HF management regimen.  Questions please call:   Charlesetta Shanks, RN BSN CCM [facility]  [phone] business mobile phone Toll free office [phone]  *Concierge Line  [phone] Fax number: [phone] [email or web address]   Reason for Referral: Care Coordination (ACO patients)   Disease managment services needed: Nurse Case Manager   Diagnoses of: Heart Failure   Expected date of contact: Urgent - 7 Days   Diet - low sodium heart healthy   Complete by: As directed    Increase activity slowly   Complete by: As directed       Allergies as of 04/25/2023       Reactions   Iodine Swelling   Neck, gland swelling   Iohexol Swelling   Neck, gland swelling   Metformin And Related Diarrhea        Medication List     STOP taking these medications    clotrimazole 1 % cream Commonly known as: Lotrimin AF   levETIRAcetam 750 MG 24 hr tablet Commonly known as: KEPPRA XR       TAKE these medications    acetaminophen 500 MG tablet Commonly known as: TYLENOL Take 1-2 tablets (500-1,000 mg total) by mouth every 6 (six) hours as needed. What changed:  how much to take when to take this reasons to take this   albuterol (2.5 MG/3ML) 0.083% nebulizer solution Commonly known as: PROVENTIL Take 3 mLs (2.5 mg total) by nebulization every 6 (six) hours as needed for wheezing or shortness of breath. What changed: Another medication with the same name was removed. Continue taking this medication, and  follow the directions you see here.   amiodarone 200 MG tablet Commonly known as: PACERONE Take 1/2 tablet (100mg ) by mouth three days per week (M,W,F) What changed:  how much to take how to take this when to take this additional instructions   ASCORBIC ACID PO Take 1 tablet by mouth daily. Vitamin C, unknown strength.   atorvastatin 80 MG tablet Commonly known as: LIPITOR Take 1 tablet (80 mg total) by mouth daily.   Breztri Aerosphere 160-9-4.8 MCG/ACT Aero Generic drug: Budeson-Glycopyrrol-Formoterol Inhale 2 puffs into the lungs in the morning and at bedtime. What changed:  when to take this reasons to take this   clotrimazole-betamethasone cream Commonly known as: LOTRISONE Apply 1 Application topically 2 (two) times daily.   fluticasone 50 MCG/ACT nasal spray Commonly known as: FLONASE Place 2 sprays into both nostrils daily.   furosemide 40 MG tablet Commonly known as: LASIX Take 1 tablet (40 mg total) by mouth daily.   gabapentin 300 MG capsule Commonly known as: NEURONTIN Take 1 capsule (300 mg total) by mouth 3 (three) times daily. What changed: when to take this   glipiZIDE 2.5 MG 24 hr tablet Commonly known as: GLUCOTROL XL Take 1 tablet (2.5 mg total) by mouth daily with breakfast.   omeprazole 40 MG capsule Commonly known as:  PRILOSEC Take 1 capsule (40 mg total) by mouth daily.   potassium chloride 10 MEQ tablet Commonly known as: KLOR-CON TAKE 2 TABLETS BY MOUTH DAILY FOR 2 DAYS THEN REDUCE TO 1 TABLET BY MOUTH DAILY What changed:  how much to take how to take this when to take this additional instructions   spironolactone 25 MG tablet Commonly known as: ALDACTONE Take 1 tablet (25 mg total) by mouth daily. Start taking on: April 26, 2023   tamsulosin 0.4 MG Caps capsule Commonly known as: FLOMAX Take 1 capsule (0.4 mg total) by mouth daily.   Xarelto 20 MG Tabs tablet Generic drug: rivaroxaban Take 1 tablet (20 mg total) by mouth  every evening with supper.       Allergies  Allergen Reactions   Iodine Swelling    Neck, gland swelling   Iohexol Swelling    Neck, gland swelling   Metformin And Related Diarrhea    Follow-up Information     Sandford Craze, NP. Go on 05/06/2023.   Specialty: Internal Medicine Why: @10 :40am Contact information: [address] [place] [address] [phone]                  The results of significant diagnostics from this hospitalization (including imaging, microbiology, ancillary and laboratory) are listed below for reference.    Significant Diagnostic Studies: DG Chest 2 View  Result Date: 04/22/2023 CLINICAL DATA:  Shortness of breath. EXAM: CHEST - 2 VIEW COMPARISON:  Mar 24, 2023 FINDINGS: Multiple sternal wires and vascular clips are noted. The heart size and mediastinal contours are within normal limits. A radiopaque atrial occlusion clip is seen. Stable parenchymal scarring and/or atelectasis is seen within the mid left lung. This extends from the left hilum and is stable in appearance. A stable left pleural effusion and associated left basilar atelectasis is noted. The right lung remains clear. No pneumothorax is identified. The visualized skeletal structures are unremarkable. IMPRESSION: 1. Evidence of prior median sternotomy/CABG. 2. Stable parenchymal scarring and/or atelectasis within the mid left lung. 3. Stable left pleural effusion and associated left basilar atelectasis. Electronically Signed   By: Aram Candela M.D.   On: 04/22/2023 17:35   MR BRAIN W WO CONTRAST  Result Date: 04/13/2023 CLINICAL DATA:  Follow-up meningioma. EXAM: MRI HEAD WITHOUT AND WITH CONTRAST TECHNIQUE: Multiplanar, multiecho pulse sequences of the brain and surrounding structures were obtained without and with intravenous contrast. CONTRAST:  8mL GADAVIST GADOBUTROL 1 MMOL/ML IV SOLN COMPARISON:  MRI head 10/15/2022. FINDINGS: Brain: No change in size of an  approximately 2.5 x 3.4 x 2.4 cm enhancing dural-based mass at the vertex (for example series 14, image 12 and series 13, image 15). Suspected mild interval increase in the size of an approximately 1.4 x 1.7 x 1.6 cm mass along the right temporal lobe convexity on series 13, image 21 and series 14, image 6 (previously 1.3 by 1.5 by 1.5 cm when remeasured on the prior in a similar fashion). Suspected mild interval increase in size of the homogeneously enhancing extra-axial mass along the planum sphenoidale which measures approximately 1.5 x 1.8 x 1.2 cm on series 13, image 22 and series 14, image 13 (previously 1.5 by 1.6 x 0.9 cm). Edema surrounding the right temporal lesion is similar versus minimally progressed. Edema at the vertex lesion is unchanged. No evidence of acute infarct, acute hemorrhage, midline shift or hydrocephalus. Vascular: Major arterial flow voids are maintained skull base. Skull and upper cervical spine:  Normal marrow signal. Sinuses/Orbits: Mild right maxillary sinus mucosal thickening. No acute orbital findings. IMPRESSION: 1. 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 Edema surrounding the right temporal lesion is similar versus minimally progressed. 2. No substantial change in the putative meningioma at the vertex. Stable surrounding edema. Electronically Signed   By: Feliberto Harts M.D.   On: 04/13/2023 13:43    Microbiology: No results found for this or any previous visit (from the past 240 hour(s)).   Labs: Basic Metabolic Panel: Recent Labs  Lab 04/22/23 1623 04/23/23 0119 04/24/23 0047 04/25/23 0050  NA 141 138 140 140  K 3.6 3.1* 3.5 4.0  CL 104 103 103 103  CO2 27 25 27 27   GLUCOSE 97 117* 109* 100*  BUN 19 15 14 12   CREATININE 0.89 0.75 0.89 0.98  CALCIUM 9.1 8.6* 8.7* 8.6*  MG  --  1.6*  --   --    Liver Function Tests: No results for input(s): "AST",  "ALT", "ALKPHOS", "BILITOT", "PROT", "ALBUMIN" in the last 168 hours. No results for input(s): "LIPASE", "AMYLASE" in the last 168 hours. No results for input(s): "AMMONIA" in the last 168 hours. CBC: Recent Labs  Lab 04/22/23 1623 04/23/23 0119 04/24/23 0047  WBC 7.6 6.8 7.2  NEUTROABS  --  4.4  --   HGB 11.3* 10.7* 12.1*  HCT 37.4* 34.9* 39.3  MCV 86.4 85.5 85.6  PLT 249 225 279   Cardiac Enzymes: No results for input(s): "CKTOTAL", "CKMB", "CKMBINDEX", "TROPONINI" in the last 168 hours. BNP: BNP (last 3 results) Recent Labs    05/07/22 0447 04/22/23 1620  BNP 203.6* 180.5*    ProBNP (last 3 results) No results for input(s): "PROBNP" in the last 8760 hours.  CBG: Recent Labs  Lab 04/24/23 0620 04/24/23 1236 04/24/23 1606 04/24/23 2054 04/25/23 0617  GLUCAP 104* 134* 89 158* 104*       Signed:  Zannie Cove MD.  [facility] 04/25/2023, 10:22 AM

## 2023-04-25 NOTE — Consult Note (Addendum)
   [facility] Inpatient Consult   04/25/2023  Robert Bates 13-Jun-1941 [id number]  Follow up:  [facility] [[facility]]  Accountable Care Organization [ACO] Patient:  Primary Care Provider:  Sandford Craze, NP, [facility] [place]   Patient evaluated for community based care coordination services on behalf of [facility] [[facility]] Care Management Program as a benefit of patient's [employer]. Spoke with patient at bedside to explain [facility] Care Management services. Patient was gracious and accepting.  Patient states he and niece completed Advanced Directives.  He usually drives to appointments. Explained for post hospital closer follow up and patient agrees to calls.  Plan:  Referral request for Care Coordination team.  Patient will receive post hospital discharge call and for assessments fo community needs for care/disease management.      Inpatient Transition of Care [TOC] Case Manager made aware that [facility] Care Management following.   Of note, [facility] Care Management services does not replace or interfere with any services that are arranged by inpatient Transition of Care [TOC] team     For additional questions or referrals please contact:    Charlesetta Shanks, RN BSN CCM [facility]  [phone] business mobile phone Toll free office [phone]  *Concierge Line  [phone] Fax number: [phone] [email or web address]

## 2023-04-25 NOTE — Progress Notes (Signed)
Pt Discharge info given to sister Steward Drone. Discharge lounge appropriate.

## 2023-04-25 NOTE — TOC Transition Note (Addendum)
Transition of Care [facility]) - CM/SW Discharge Note   Patient Details  Name: Robert Bates MRN: [id number] Date of Birth: [birth year]  Transition of Care [facility]) CM/SW Contact:  Leone Haven, RN Phone Number: 04/25/2023, 10:00 AM   Clinical Narrative:    Patient is for dc today, he has transportation.   NCM asked patient if he wants a HHRN for medication management, he states no he has his niece who is a retired Charity fundraiser.  TOC to fill meds.  Family will be here at 1 pm per Staff RN.     Barriers to Discharge: Continued Medical Work up   Patient Goals and CMS Choice   Choice offered to / list presented to : NA  Discharge Placement                         Discharge Plan and Services Additional resources added to the After Visit Summary for   In-house Referral: NA Discharge Planning Services: CM Consult Post Acute Care Choice: NA          DME Arranged: N/A DME Agency: NA       HH Arranged: NA          Social Determinants of Health (SDOH) Interventions SDOH Screenings   Food Insecurity: No Food Insecurity (04/22/2023)  Housing: Low Risk  (04/22/2023)  Transportation Needs: No Transportation Needs (04/22/2023)  Utilities: Not At Risk (04/22/2023)  Alcohol Screen: Low Risk  (10/02/2022)  Depression (PHQ2-9): Low Risk  (10/02/2022)  Financial Resource Strain: Medium Risk (01/03/2023)  Physical Activity: Insufficiently Active (08/05/2021)  Social Connections: Moderately Isolated (05/13/2022)  Stress: No Stress Concern Present (10/01/2021)  Tobacco Use: Medium Risk (04/22/2023)     Readmission Risk Interventions    05/15/2021    3:55 PM  Readmission Risk Prevention Plan  Transportation Screening Complete  PCP or Specialist Appt within 5-7 Days Complete  Home Care Screening Complete  Medication Review (RN CM) Complete

## 2023-04-28 ENCOUNTER — Telehealth: Payer: Self-pay | Admitting: *Deleted

## 2023-04-28 ENCOUNTER — Ambulatory Visit: Payer: Medicare Other | Admitting: Urology

## 2023-04-28 ENCOUNTER — Encounter: Payer: Self-pay | Admitting: *Deleted

## 2023-04-28 NOTE — Progress Notes (Deleted)
Assessment: 1. Phimosis   2. Balanitis      Plan: I personally reviewed the patient's chart including provider notes. I personally reviewed the patient's chart including provider notes. Recommend course of Lotrisone cream to foreskin BID x 2 weeks He has a number of risk factors for surgical management with circumcision. Return to office in 2-3 weeks   Chief Complaint:  No chief complaint on file.   History of Present Illness:  Robert Bates is a 82 y.o. male who is seen for further evaluation of phimosis.  He notes his symptoms began approximately 1 month ago.  He is uncircumcised.  He previously has not had any problems retracting the foreskin.  He recently noted some irritation of the foreskin and difficulty with retraction.  He has been using antifungal cream without significant improvement.  He reports splaying of his urinary stream.  No dysuria or gross hematuria. He also notes sensation of incomplete emptying, frequency, decreased stream, and straining to void. IPSS = 30 today. He is currently on tamsulosin.  Portions of the above documentation were copied from a prior visit for review purposes only.    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   COPD (chronic obstructive pulmonary disease) (HCC)    Coronary artery disease    Diabetes type 2, controlled (HCC) 07/31/2017   Dyspnea    Dysrhythmia    a fib   GERD (gastroesophageal reflux disease)    Hematuria    refuses work up or referral - understands risks of morbidity / mortality - 11/2008, 12/2008   Heme positive stool    History of hiatal hernia    History of kidney stones    Hyperlipemia    Meningioma (HCC) 10/25/2013    Follows with Dr. Coletta Memos.    Peripheral vascular disease (HCC)    Abdominal Aortic Aneursym   Pneumonia    as a child   Radiation 09/18/15-10/25/15   left lower lobe 70.2 Gy   Seizures (HCC) 02/18/2020   Tobacco abuse     Past Surgical History:  Past Surgical History:  Procedure Laterality Date   CHOLECYSTECTOMY N/A 07/23/2017   Procedure: LAPAROSCOPIC CHOLECYSTECTOMY;  Surgeon: Rodman Pickle, MD;  Location: [facility];  Service: General;  Laterality: N/A;   CLIPPING OF ATRIAL APPENDAGE Left 05/08/2021   Procedure: CLIPPING OF ATRIAL APPENDAGE USING 45 ATRICLIP;  Surgeon: Linden Dolin, MD;  Location: [facility];  Service: Open Heart Surgery;  Laterality: Left;   COLONOSCOPY     CORONARY ARTERY BYPASS GRAFT N/A 05/08/2021   Procedure: CORONARY ARTERY BYPASS GRAFTING (CABG)X 3 USING LEFT INTERNAL MAMMARY ARTERY AND RIGHT GREATER SAPEHNOUS VEIN;  Surgeon: Linden Dolin, MD;  Location: [facility];  Service: Open Heart Surgery;  Laterality: N/A;   ENDOVEIN HARVEST OF GREATER SAPHENOUS VEIN Right 05/08/2021   Procedure: ENDOVEIN HARVEST OF GREATER SAPHENOUS VEIN;  Surgeon: Linden Dolin, MD;  Location: [facility];  Service: Open Heart Surgery;  Laterality: Right;   EYE SURGERY Bilateral    Cataracts removed w/ lens implant   HERNIA REPAIR     Left 36 years ago . Right inguinal hernia repair 10-01-17 Dr. Sheliah Hatch   INGUINAL  HERNIA REPAIR Right 10/01/2017   Procedure: RIGHT INGUINAL HERNIA REPAIR WITH MESH;  Surgeon: Kinsinger, De Blanch, MD;  Location: [facility];  Service: General;  Laterality: Right;  TAP BLOCK   INSERTION OF MESH Right 10/01/2017   Procedure: INSERTION OF MESH;  Surgeon: Sheliah Hatch De Blanch, MD;  Location: [facility];  Service: General;  Laterality: Right;   IR THORACENTESIS ASP PLEURAL SPACE W/IMG GUIDE  05/18/2021   IR THORACENTESIS ASP PLEURAL SPACE W/IMG GUIDE  06/07/2021   LEFT HEART CATH AND CORONARY ANGIOGRAPHY N/A 04/20/2021   Procedure: LEFT HEART CATH AND CORONARY  ANGIOGRAPHY;  Surgeon: Iran Ouch, MD;  [facility];  Service: Cardiovascular;  Laterality: N/A;   TEE WITHOUT CARDIOVERSION N/A 05/08/2021   Procedure: TRANSESOPHAGEAL ECHOCARDIOGRAM (TEE);  Surgeon: Linden Dolin, MD;  Location: [facility] OR;  Service: Open Heart Surgery;  Laterality: N/A;   TONSILLECTOMY     TONSILLECTOMY     VIDEO BRONCHOSCOPY Bilateral 07/26/2015   Procedure: VIDEO BRONCHOSCOPY WITH FLUORO;  Surgeon: Nyoka Cowden, MD;  [facility];  Service: Cardiopulmonary;  Laterality: Bilateral;   VIDEO BRONCHOSCOPY WITH ENDOBRONCHIAL NAVIGATION N/A 08/23/2015   Procedure: VIDEO BRONCHOSCOPY WITH ENDOBRONCHIAL NAVIGATION;  Surgeon: Delight Ovens, MD;  [facility];  Service: Thoracic;  Laterality: N/A;   VIDEO BRONCHOSCOPY WITH ENDOBRONCHIAL ULTRASOUND N/A 08/23/2015   Procedure: VIDEO BRONCHOSCOPY WITH ENDOBRONCHIAL ULTRASOUND;  Surgeon: Delight Ovens, MD;  [facility];  Service: Thoracic;  Laterality: N/A;    Allergies:  Allergies  Allergen Reactions   Iodine Swelling    Neck, gland swelling   Iohexol Swelling    Neck, gland swelling   Metformin And Related Diarrhea    Family History:  Family History  Problem Relation Age of Onset   Leukemia Father    Emphysema Father    Learning disabilities Son    Atrial fibrillation Son    Leukemia Other    Stroke Other     Social History:  Social History   Tobacco Use   Smoking status: Former    Packs/day: 1.00    Years: 57.00    Additional pack years: 0.00    Total pack years: 57.00    Types: Cigarettes, Cigars    Quit date: 08/08/2015    Years since quitting: 7.7   Smokeless tobacco: Former    Types: Chew    Quit date: 11/04/1958   Tobacco comments:    Will smoke cigar every once in awhile.  Vaping daily started a couple weeks ago.  04/18/22 hfb  Vaping Use   Vaping Use: Some days  Substance Use Topics   Alcohol use: Not Currently    Alcohol/week: 0.0 standard drinks of  alcohol   Drug use: No    ROS: Constitutional:  Negative for fever, chills, weight loss CV: Negative for chest pain, previous MI, hypertension Respiratory:  Negative for shortness of breath, wheezing, sleep apnea, frequent cough GI:  Negative for nausea, vomiting, bloody stool, GERD  Physical exam: There were no vitals taken for this visit. GENERAL APPEARANCE:  Well appearing, well developed, well nourished, NAD HEENT:  Atraumatic, normocephalic, oropharynx clear NECK:  Supple without lymphadenopathy or thyromegaly ABDOMEN:  Soft, non-tender, no masses EXTREMITIES:  Moves all extremities well, without clubbing, cyanosis, or edema NEUROLOGIC:  Alert and oriented x 3, normal gait, CN II-XII grossly intact MENTAL STATUS:  appropriate BACK:  Non-tender to palpation, No CVAT SKIN:  Warm, dry, and intact   Results: No sample  provided

## 2023-04-28 NOTE — Transitions of Care (Post Inpatient/ED Visit) (Signed)
   04/28/2023  Name: Robert Bates MRN: [id number] DOB: 01-22-1941  Today's TOC FU Call Status: Today's TOC FU Call Status:: Unsuccessul Call (1st Attempt) Unsuccessful Call (1st Attempt) Date: 04/28/23  Attempted to reach the patient regarding the most recent Inpatient visit; left HIPAA compliant voice message requesting call back  Follow Up Plan: Additional outreach attempts will be made to reach the patient to complete the Transitions of Care (Post Inpatient visit) call.   Caryl Pina, RN, BSN, CCRN Alumnus RN CM Care Coordination/ Transition of Care- [facility] Care Management [phone]: direct office

## 2023-04-29 ENCOUNTER — Telehealth: Payer: Self-pay | Admitting: *Deleted

## 2023-04-29 ENCOUNTER — Encounter: Payer: Self-pay | Admitting: *Deleted

## 2023-04-29 ENCOUNTER — Ambulatory Visit (HOSPITAL_COMMUNITY): Payer: Medicare Other

## 2023-04-29 ENCOUNTER — Ambulatory Visit: Payer: Self-pay | Admitting: *Deleted

## 2023-04-29 NOTE — Transitions of Care (Post Inpatient/ED Visit) (Signed)
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  I am not having any problems.  Please call my niece-- she is a retired Engineer, civil (consulting) and she handles all of my medicines and health care needs") Any questions or concerns?: No  Items Reviewed: Did you receive and understand the discharge instructions provided?: Yes (thoroughly reviewed with patient and caregiver/ niece who verbalizes good understanding of same) Medications obtained,verified, and reconciled?: Yes (Medications Reviewed) (Full medication reconciliation/ review completed; no concerns or discrepancies identified; confirmed patient obtained/ is taking all newly Rx'd medications as instructed; niece-manages medications and denies questions/ concerns around medications today) Any new allergies since your discharge?: No Dietary orders reviewed?: Yes Type of Diet Ordered:: Regular, "as healthy as possible" Do you have support at home?: Yes People in Home: alone Name of Support/Comfort Primary Source: Reports independent in self-care activities; supportive local niece manages all aspects of health care needs/ medications- she is retired Charity fundraiser and assists as/ if needed/ indicated  Medications Reviewed Today: Medications Reviewed Today     Reviewed by Michaela Corner, RN (Registered Nurse) on 04/29/23 at 1341  Med List Status: <None>   Medication Order Taking? Sig Documenting Provider Last Dose Status Informant   acetaminophen (TYLENOL) 500 MG tablet [id number] Yes Take 1-2 tablets (500-1,000 mg total) by mouth every 6 (six) hours as needed.  Patient taking differently: Take 1,000 mg by mouth daily as needed for headache, fever or moderate pain.   Ardelle Balls, PA-C Taking Active Self           Med Note Michaela Corner   Tue Apr 29, 2023  1:41 PM) 04/29/23: Reports during [facility] call has not needed recently   albuterol (PROVENTIL) (2.5 MG/3ML) 0.083% nebulizer solution [id number] Yes Take 3 mLs (2.5 mg total) by nebulization every 6 (six) hours as needed for wheezing or shortness of breath. Charlott Holler, MD Taking Active Self, Pharmacy Records           Med Note [facility], [place] [facility]   Thu Jun 06, 2022 11:35 AM)    amiodarone (PACERONE) 200 MG tablet [id number] Yes Take 1/2 tablet (100mg ) by mouth three days per week (M,W,F)  Patient taking differently: Take 100 mg by mouth every Monday, Wednesday, and Friday.   Nahser, Deloris Ping, MD Taking Active Self, Pharmacy Records           Med Note (COFFELL, Otis Brace Apr 23, 2023 12:19 PM) Pt states he still takes this medication, as directed. Per dispense report, LF 10/07/2022 #6, 28 DS.  ASCORBIC ACID PO [id number] Yes Take 1 tablet by mouth daily. Vitamin C, unknown strength. [provider] Taking Active Self  atorvastatin (LIPITOR) 80 MG tablet [id number] Yes Take 1 tablet (80 mg total) by mouth daily. Nahser, Deloris Ping, MD Taking Active Self, Pharmacy Records  Budeson-Glycopyrrol-Formoterol [facility] AEROSPHERE) 160-9-4.8 MCG/ACT Sandrea Matte [id number] Yes Inhale 2 puffs into the lungs in the morning and at bedtime.  Patient taking differently: Inhale 2 puffs into the  lungs 2 (two) times daily as needed (wheezing, shortness of breath).   Charlott Holler, MD Taking Active Self, Pharmacy Records           Med Note [facility], Otis Brace Apr 23, 2023 12:21 PM) Has some left from patient assistance program in 2023  clotrimazole-betamethasone (Thurmond Butts)  cream [id number] Yes Apply 1 Application topically 2 (two) times daily. Stoneking, Danford Bad., MD Taking Active Self, Pharmacy Records           Med Note Michaela Corner   Tue Apr 29, 2023  1:41 PM) 04/29/23: Reports during [facility] call has not needed recently   fluticasone (FLONASE) 50 MCG/ACT nasal spray [id number] Yes Place 2 sprays into both nostrils daily. Saguier, Kateri Mc Taking Active Self, Pharmacy Records           Med Note Michaela Corner   Tue Apr 29, 2023  1:38 PM) 04/29/23: Reports during [facility] call has not needed recently  furosemide (LASIX) 40 MG tablet [id number] Yes Take 1 tablet (40 mg total) by mouth daily. Sandford Craze, NP Taking Active Self, Pharmacy Records  gabapentin (NEURONTIN) 300 MG capsule [id number] Yes Take 1 capsule (300 mg total) by mouth 3 (three) times daily.  Patient taking differently: Take 300 mg by mouth 2 (two) times daily.   Sandford Craze, NP Taking Active Self, Pharmacy Records  glipiZIDE (GLUCOTROL XL) 2.5 MG 24 hr tablet [id number] Yes Take 1 tablet (2.5 mg total) by mouth daily with breakfast. Bradd Canary, MD Taking Active Self, Pharmacy Records  omeprazole (PRILOSEC) 40 MG capsule [id number] Yes Take 1 capsule (40 mg total) by mouth daily. Sandford Craze, NP Taking Active Self, Pharmacy Records  potassium chloride (KLOR-CON) 10 MEQ tablet [id number] Yes TAKE 2 TABLETS BY MOUTH DAILY FOR 2 DAYS THEN REDUCE TO 1 TABLET BY MOUTH DAILY  Patient taking differently: Take 10 mEq by mouth daily.   Tereso Newcomer T, PA-C Taking Active Self, Pharmacy Records  rivaroxaban (XARELTO) 20 MG TABS tablet [id number] Yes Take 1 tablet (20 mg total) by mouth every evening with supper. Nahser, Deloris Ping, MD Taking Active Self, Pharmacy Records  spironolactone (ALDACTONE) 25 MG tablet [id number] Yes Take 1 tablet (25 mg total) by mouth daily. Zannie Cove, MD Taking Active   tamsulosin [facility]) 0.4 MG CAPS capsule [id number] Yes Take 1 capsule (0.4 mg total) by  mouth daily. Sandford Craze, NP Taking Active Self, Pharmacy Records           Home Care and Equipment/Supplies: Were Home Health Services Ordered?: No Any new equipment or medical supplies ordered?: No  Functional Questionnaire: Do you need assistance with bathing/showering or dressing?: No Do you need assistance with meal preparation?: No Do you need assistance with eating?: No Do you have difficulty maintaining continence: No Do you need assistance with getting out of bed/getting out of a chair/moving?: No Do you have difficulty managing or taking your medications?: Yes (niece Steward Drone manages all aspects of medication administration)  Follow up appointments reviewed: PCP Follow-up appointment confirmed?: Yes Date of PCP follow-up appointment?: 05/06/23 Follow-up Provider: PCP Specialist Hospital Follow-up appointment confirmed?: No Reason Specialist Follow-Up Not Confirmed: Patient has Specialist Provider Number and will Call for Appointment Do you need transportation to your follow-up appointment?: No Do you understand care options if your condition(s) worsen?: Yes-patient verbalized understanding  SDOH Interventions Today    Flowsheet Row Most Recent Value  SDOH Interventions   Food Insecurity Interventions Intervention Not Indicated  Transportation  Interventions Intervention Not Indicated  [continues to work,  continues to drive self]      TOC Interventions Today    Flowsheet Row Most Recent Value  TOC Interventions   TOC Interventions Discussed/Reviewed TOC Interventions Discussed  [Patient/ niece declines need for ongoing/ further care coordination outreach,  no care coordination needs identified at time of TOC call today,  provided my direct contact information should questions/ concerns/ needs arise post-TOC call]      Interventions Today    Flowsheet Row Most Recent Value  Chronic Disease   Chronic disease during today's visit Congestive Heart Failure  (CHF)  General Interventions   General Interventions Discussed/Reviewed Doctor Visits, Durable Medical Equipment (DME), General Interventions Discussed  Doctor Visits Discussed/Reviewed Doctor Visits Discussed, Specialist, PCP  Durable Medical Equipment (DME) Other  [confirmed with patient he does not currently require assistive devices for ambulation]  Nutrition Interventions   Nutrition Discussed/Reviewed Nutrition Discussed  Pharmacy Interventions   Pharmacy Dicussed/Reviewed Pharmacy Topics Discussed  [Full medication review completed with patient's niece Steward Drone, with updating medication list in EHR per patient/ niece report]  Safety Interventions   Safety Discussed/Reviewed Safety Discussed      Caryl Pina, RN, BSN, CCRN Alumnus RN CM Care Coordination/ Transition of Care- [facility] Care Management [phone]: direct office

## 2023-04-29 NOTE — Chronic Care Management (AMB) (Signed)
   04/29/2023  Zhion Pevehouse 1941/08/08 [id number]  Patient is not participating in chronic care management (CCM), status changed to previously enrolled.  Irving Shows [facility], BSN RN Case Manager [phone]

## 2023-05-01 ENCOUNTER — Telehealth: Payer: Self-pay | Admitting: *Deleted

## 2023-05-01 NOTE — Progress Notes (Signed)
  Care Coordination  Outreach Note  05/01/2023 Name: Robert Bates MRN: [id number] DOB: 1941/07/29   Care Coordination Outreach Attempts: An unsuccessful telephone outreach was attempted today to offer the patient information about available care coordination services.  Follow Up Plan:  Additional outreach attempts will be made to offer the patient care coordination information and services.   Encounter Outcome:  Pt. Request to Call Back - spoke with Steward Drone who was on her way to pt home to check on him as he stated he didn't feel well. Steward Drone asked that we call her back tomorrow - will f/u   Burman Nieves, [facility] Care Coordination Care Guide Direct Dial: [phone]

## 2023-05-02 NOTE — Progress Notes (Signed)
  Care Coordination   Note   05/02/2023 Name: Robert Bates MRN: [id number] DOB: 05-31-1941  Robert Bates is a 82 y.o. year old male who sees Sandford Craze, NP for primary care. I reached out to [name] by phone today to offer care coordination services.  Mr. Hunsaker was given information about Care Coordination services today including:   The Care Coordination services include support from the care team which includes your Nurse Coordinator, Clinical Social Worker, or Pharmacist.  The Care Coordination team is here to help remove barriers to the health concerns and goals most important to you. Care Coordination services are voluntary, and the patient may decline or stop services at any time by request to their care team member.   Care Coordination Consent Status: Patient agreed to services and verbal consent obtained.   Follow up plan:  Telephone appointment with care coordination team member scheduled for:  05/19/2023  Encounter Outcome:  Pt. Scheduled from referral   Burman Nieves, [facility] Care Coordination Care Guide Direct Dial: [phone]

## 2023-05-06 ENCOUNTER — Inpatient Hospital Stay: Payer: Medicare Other | Admitting: Family

## 2023-05-06 ENCOUNTER — Other Ambulatory Visit: Payer: Self-pay | Admitting: Physician Assistant

## 2023-05-06 ENCOUNTER — Other Ambulatory Visit: Payer: Self-pay

## 2023-05-06 ENCOUNTER — Other Ambulatory Visit (HOSPITAL_BASED_OUTPATIENT_CLINIC_OR_DEPARTMENT_OTHER): Payer: Self-pay

## 2023-05-06 ENCOUNTER — Other Ambulatory Visit: Payer: Self-pay | Admitting: Family

## 2023-05-06 MED ORDER — POTASSIUM CHLORIDE ER 10 MEQ PO TBCR
10.0000 meq | EXTENDED_RELEASE_TABLET | Freq: Every day | ORAL | 3 refills | Status: DC
  Filled 2023-05-06: qty 90, 90d supply, fill #0
  Filled 2023-05-20: qty 90, 90d supply, fill #1

## 2023-05-06 MED ORDER — OMEPRAZOLE 40 MG PO CPDR
40.0000 mg | DELAYED_RELEASE_CAPSULE | Freq: Every day | ORAL | 0 refills | Status: DC
  Filled 2023-05-06: qty 90, 90d supply, fill #0

## 2023-05-09 ENCOUNTER — Other Ambulatory Visit: Payer: Self-pay

## 2023-05-09 ENCOUNTER — Ambulatory Visit (INDEPENDENT_AMBULATORY_CARE_PROVIDER_SITE_OTHER): Payer: Medicare Other | Admitting: Family

## 2023-05-09 ENCOUNTER — Telehealth: Payer: Self-pay | Admitting: Family

## 2023-05-09 ENCOUNTER — Other Ambulatory Visit (HOSPITAL_BASED_OUTPATIENT_CLINIC_OR_DEPARTMENT_OTHER): Payer: Self-pay

## 2023-05-09 ENCOUNTER — Other Ambulatory Visit: Payer: Self-pay | Admitting: Family

## 2023-05-09 VITALS — BP 106/60 | HR 73 | Temp 98.0°F | Resp 18 | Ht 67.0 in | Wt 179.2 lb

## 2023-05-09 DIAGNOSIS — Z7984 Long term (current) use of oral hypoglycemic drugs: Secondary | ICD-10-CM | POA: Diagnosis not present

## 2023-05-09 DIAGNOSIS — E785 Hyperlipidemia, unspecified: Secondary | ICD-10-CM | POA: Diagnosis not present

## 2023-05-09 DIAGNOSIS — E119 Type 2 diabetes mellitus without complications: Secondary | ICD-10-CM | POA: Diagnosis not present

## 2023-05-09 DIAGNOSIS — I5033 Acute on chronic diastolic (congestive) heart failure: Secondary | ICD-10-CM | POA: Diagnosis not present

## 2023-05-09 DIAGNOSIS — J449 Chronic obstructive pulmonary disease, unspecified: Secondary | ICD-10-CM

## 2023-05-09 DIAGNOSIS — I10 Essential (primary) hypertension: Secondary | ICD-10-CM

## 2023-05-09 LAB — BASIC METABOLIC PANEL
BUN: 18 mg/dL (ref 6–23)
CO2: 30 mEq/L (ref 19–32)
Calcium: 9.4 mg/dL (ref 8.4–10.5)
Chloride: 102 mEq/L (ref 96–112)
Creatinine, Ser: 0.86 mg/dL (ref 0.40–1.50)
GFR: 80.99 mL/min (ref >60.00)
Glucose, Bld: 117 mg/dL — ABNORMAL HIGH (ref 70–99)
Potassium: 3.7 mEq/L (ref 3.5–5.1)
Sodium: 141 mEq/L (ref 135–145)

## 2023-05-09 MED ORDER — TAMSULOSIN HCL 0.4 MG PO CAPS
0.4000 mg | ORAL_CAPSULE | Freq: Every day | ORAL | 1 refills | Status: DC
  Filled 2023-05-09: qty 90, 90d supply, fill #0
  Filled 2023-08-19 – 2023-08-20 (×2): qty 90, 90d supply, fill #1

## 2023-05-09 MED ORDER — SPIRONOLACTONE 25 MG PO TABS
25.0000 mg | ORAL_TABLET | Freq: Every day | ORAL | 1 refills | Status: DC
Start: 2023-05-09 — End: 2023-10-14
  Filled 2023-05-09 – 2023-05-20 (×3): qty 90, 90d supply, fill #0
  Filled 2023-08-19 – 2023-08-20 (×2): qty 90, 90d supply, fill #1

## 2023-05-09 NOTE — Telephone Encounter (Signed)
Attempted to reach his niece- no answer. Left message that I will try to reach back out this afternoon.

## 2023-05-09 NOTE — Assessment & Plan Note (Signed)
Lab Results  Component Value Date   HGBA1C 6.7 (H) 04/03/2023   A1C at goal. Continue Glipizide.

## 2023-05-09 NOTE — Assessment & Plan Note (Signed)
Lab Results  Component Value Date   CHOL 127 10/07/2022   HDL 64 10/07/2022   LDLCALC 46 10/07/2022   LDLDIRECT 142.2 12/26/2008   TRIG 93 10/07/2022   CHOLHDL 2.0 10/07/2022

## 2023-05-09 NOTE — Assessment & Plan Note (Signed)
BP Readings from Last 3 Encounters:  05/09/23 106/60  04/25/23 110/87  04/07/23 (!) 87/65

## 2023-05-09 NOTE — Telephone Encounter (Signed)
Dr. Elease Hashimoto,  This pt had a recent CHF admission. He is not scheduled for follow up with cardiology until November. Do you think you could have your office work him in for a sooner follow up please? Tks!

## 2023-05-09 NOTE — Assessment & Plan Note (Signed)
+   wheezing today- I think that wheezing is likely worsened by volume overload.  Diurese as noted.  He has Breztri on hand for prn use as well as albuterol.

## 2023-05-09 NOTE — Progress Notes (Signed)
Subjective:     Patient ID: Robert Bates, male    DOB: 19-Feb-1941, 82 y.o.   MRN: [id number]  Chief Complaint  Patient presents with   Hospitalization Follow-up    HPI  Discussed the use of AI scribe software for clinical note transcription with the patient, who gave verbal consent to proceed.  History of Present Illness   The patient, an 82 year old male with a history of diastolic heart failure, lung cancer, and recurrent left pleural effusion, presents for a follow-up visit after recent hospitalization. The patient was admitted to the hospital due to acute on chronic diastolic heart failure, which was attributed to him being off his diuretics for a few days. During his hospital stay, he lost 12 pounds due to diuresis and was started on Aldactone and transitioned back to oral Lasix 40 mg daily.  The patient reports experiencing shortness of breath, particularly when walking.  The patient's niece, who is a retired Engineer, civil (consulting) and is currently taking care of him, has decided to defer the placement of a Pleurx catheter. The patient also reports feeling nauseous and has been experiencing stomach pain.  The patient has been taking his diuretics regularly since his hospitalization and has noticed some weight loss. However, he reports not urinating as much as he used to. The patient also mentions that he has been feeling more short of breath, particularly when walking, and has given up on walking due to this. The patient also reports feeling tightness in his chest.     Wt Readings from Last 3 Encounters:  05/09/23 179 lb 3.2 oz (81.3 kg)  04/25/23 170 lb 3.1 oz (77.2 kg)  04/07/23 181 lb (82.1 kg)    Lab Results  Component Value Date   HGBA1C 6.7 (H) 04/03/2023   HGBA1C 7.4 (H) 11/15/2022   HGBA1C 6.4 04/19/2022   Lab Results  Component Value Date   MICROALBUR 0.9 01/15/2022   LDLCALC 46 10/07/2022   CREATININE 0.98 04/25/2023        Health Maintenance Due  Topic Date Due    Zoster Vaccines- Shingrix (1 of 2) Never done   DTaP/Tdap/Td (2 - Tdap) 07/25/2018   COVID-19 Vaccine (6 - 2023-24 season) 07/05/2022   Diabetic kidney evaluation - Urine ACR  01/16/2023    Past Medical History:  Diagnosis Date   Anxiety    Aortic dilatation (HCC) 05/13/2022   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   COPD (chronic obstructive pulmonary disease) (HCC)    Coronary artery disease    Diabetes type 2, controlled (HCC) 07/31/2017   Dyspnea    Dysrhythmia    a fib   GERD (gastroesophageal reflux disease)    Hematuria    refuses work up or referral - understands risks of morbidity / mortality - 11/2008, 12/2008   Heme positive stool    History of hiatal hernia    History of kidney stones    Hyperlipemia    Meningioma (HCC) 10/25/2013   Follows with Dr. Coletta Memos.    Peripheral vascular disease (HCC)    Abdominal Aortic Aneursym   Pneumonia    as a child   Radiation 09/18/15-10/25/15   left lower lobe 70.2 Gy   Seizures (HCC) 02/18/2020   Tobacco abuse     Past Surgical History:  Procedure Laterality  Date   CHOLECYSTECTOMY N/A 07/23/2017   Procedure: LAPAROSCOPIC CHOLECYSTECTOMY;  Surgeon: Kinsinger, De Blanch, MD;  Location: [facility];  Service: General;  Laterality: N/A;   CLIPPING OF ATRIAL APPENDAGE Left 05/08/2021   Procedure: CLIPPING OF ATRIAL APPENDAGE USING 45 ATRICLIP;  Surgeon: Linden Dolin, MD;  Location: [facility];  Service: Open Heart Surgery;  Laterality: Left;   COLONOSCOPY     CORONARY ARTERY BYPASS GRAFT N/A 05/08/2021   Procedure: CORONARY ARTERY BYPASS GRAFTING (CABG)X 3 USING LEFT INTERNAL MAMMARY ARTERY AND RIGHT GREATER SAPEHNOUS VEIN;  Surgeon: Linden Dolin, MD;  Location: [facility];  Service: Open Heart Surgery;  Laterality: N/A;   ENDOVEIN HARVEST OF GREATER SAPHENOUS  VEIN Right 05/08/2021   Procedure: ENDOVEIN HARVEST OF GREATER SAPHENOUS VEIN;  Surgeon: Linden Dolin, MD;  Location: [facility];  Service: Open Heart Surgery;  Laterality: Right;   EYE SURGERY Bilateral    Cataracts removed w/ lens implant   HERNIA REPAIR     Left 36 years ago . Right inguinal hernia repair 10-01-17 Dr. Sheliah Hatch   INGUINAL HERNIA REPAIR Right 10/01/2017   Procedure: RIGHT INGUINAL HERNIA REPAIR WITH MESH;  Surgeon: Kinsinger, De Blanch, MD;  Location: [facility];  Service: General;  Laterality: Right;  TAP BLOCK   INSERTION OF MESH Right 10/01/2017   Procedure: INSERTION OF MESH;  Surgeon: Sheliah Hatch De Blanch, MD;  Location: [facility];  Service: General;  Laterality: Right;   IR THORACENTESIS ASP PLEURAL SPACE W/IMG GUIDE  05/18/2021   IR THORACENTESIS ASP PLEURAL SPACE W/IMG GUIDE  06/07/2021   LEFT HEART CATH AND CORONARY ANGIOGRAPHY N/A 04/20/2021   Procedure: LEFT HEART CATH AND CORONARY ANGIOGRAPHY;  Surgeon: Iran Ouch, MD;  Location: [facility];  Service: Cardiovascular;  Laterality: N/A;   TEE WITHOUT CARDIOVERSION N/A 05/08/2021   Procedure: TRANSESOPHAGEAL ECHOCARDIOGRAM (TEE);  Surgeon: Linden Dolin, MD;  Location: [facility];  Service: Open Heart Surgery;  Laterality: N/A;   TONSILLECTOMY     TONSILLECTOMY     VIDEO BRONCHOSCOPY Bilateral 07/26/2015   Procedure: VIDEO BRONCHOSCOPY WITH FLUORO;  Surgeon: Nyoka Cowden, MD;  Location: [facility];  Service: Cardiopulmonary;  Laterality: Bilateral;   VIDEO BRONCHOSCOPY WITH ENDOBRONCHIAL NAVIGATION N/A 08/23/2015   Procedure: VIDEO BRONCHOSCOPY WITH ENDOBRONCHIAL NAVIGATION;  Surgeon: Delight Ovens, MD;  Location: [facility];  Service: Thoracic;  Laterality: N/A;   VIDEO BRONCHOSCOPY WITH ENDOBRONCHIAL ULTRASOUND N/A 08/23/2015   Procedure: VIDEO BRONCHOSCOPY WITH ENDOBRONCHIAL ULTRASOUND;  Surgeon: Delight Ovens, MD;  Location: [facility];  Service: Thoracic;  Laterality: N/A;    Family History  Problem  Relation Age of Onset   Leukemia Father    Emphysema Father    Learning disabilities Son    Atrial fibrillation Son    Leukemia Other    Stroke Other     Social History   Socioeconomic History   Marital status: Widowed    Spouse name: Not on file   Number of children: 2   Years of education: Not on file   Highest education level: Not on file  Occupational History   Occupation: Retired    Associate Professor: [employer]    Comment: truck Air traffic controller: [employer]  Tobacco Use   Smoking status: Former    Packs/day: 1.00    Years: 57.00    Additional pack years: 0.00    Total pack years: 57.00    Types: Cigarettes, Cigars    Quit date: 08/08/2015  Years since quitting: 7.7   Smokeless tobacco: Former    Types: Chew    Quit date: 11/04/1958   Tobacco comments:    Will smoke cigar every once in awhile.  Vaping daily started a couple weeks ago.  04/18/22 hfb  Vaping Use   Vaping Use: Some days  Substance and Sexual Activity   Alcohol use: Not Currently    Alcohol/week: 0.0 standard drinks of alcohol   Drug use: No   Sexual activity: Not Currently  Other Topics Concern   Not on file  Social History Narrative   Not on file   Social Determinants of Health   Financial Resource Strain: Medium Risk (01/03/2023)   Overall Financial Resource Strain (CARDIA)    Difficulty of Paying Living Expenses: Somewhat hard  Food Insecurity: No Food Insecurity (04/29/2023)   Hunger Vital Sign    Worried About Running Out of Food in the Last Year: Never true    Ran Out of Food in the Last Year: Never true  Transportation Needs: No Transportation Needs (04/29/2023)   PRAPARE - Administrator, Civil Service (Medical): No    Lack of Transportation (Non-Medical): No  Physical Activity: Insufficiently Active (08/05/2021)   Exercise Vital Sign    Days of Exercise per Week: 2 days    Minutes of Exercise per Session: 40 min  Stress: No Stress Concern Present (10/01/2021)   [employer] of Occupational Health - Occupational Stress Questionnaire    Feeling of Stress : Not at all  Social Connections: Moderately Isolated (05/13/2022)   Social Connection and Isolation Panel [NHANES]    Frequency of Communication with Friends and Family: Twice a week    Frequency of Social Gatherings with Friends and Family: Once a week    Attends Religious Services: Never    Database administrator or Organizations: No    Attends Banker Meetings: Never    Marital Status: Married  Catering manager Violence: Not At Risk (04/22/2023)   Humiliation, Afraid, Rape, and Kick questionnaire    Fear of Current or Ex-Partner: No    Emotionally Abused: No    Physically Abused: No    Sexually Abused: No    Outpatient Medications Prior to Visit  Medication Sig Dispense Refill   acetaminophen (TYLENOL) 500 MG tablet Take 1-2 tablets (500-1,000 mg total) by mouth every 6 (six) hours as needed. (Patient taking differently: Take 1,000 mg by mouth daily as needed for headache, fever or moderate pain.) 30 tablet 0   albuterol (PROVENTIL) (2.5 MG/3ML) 0.083% nebulizer solution Take 3 mLs (2.5 mg total) by nebulization every 6 (six) hours as needed for wheezing or shortness of breath. 75 mL 12   amiodarone (PACERONE) 200 MG tablet Take 1/2 tablet (100mg ) by mouth three days per week (M,W,F) (Patient taking differently: Take 100 mg by mouth every Monday, Wednesday, and Friday.) 6 tablet 3   ASCORBIC ACID PO Take 1 tablet by mouth daily. Vitamin C, unknown strength.     atorvastatin (LIPITOR) 80 MG tablet Take 1 tablet (80 mg total) by mouth daily. 90 tablet 2   Budeson-Glycopyrrol-Formoterol (BREZTRI AEROSPHERE) 160-9-4.8 MCG/ACT AERO Inhale 2 puffs into the lungs in the morning and at bedtime. (Patient taking differently: Inhale 2 puffs into the lungs 2 (two) times daily as needed (wheezing, shortness of breath).) 10.7 g 5   clotrimazole-betamethasone (LOTRISONE) cream Apply 1 Application  topically 2 (two) times daily. 45 g 2   fluticasone (FLONASE) 50 MCG/ACT nasal  spray Place 2 sprays into both nostrils daily. 16 g 1   furosemide (LASIX) 40 MG tablet Take 1 tablet (40 mg total) by mouth daily. 90 tablet 0   gabapentin (NEURONTIN) 300 MG capsule Take 1 capsule (300 mg total) by mouth 3 (three) times daily. (Patient taking differently: Take 300 mg by mouth 2 (two) times daily.) 90 capsule 5   glipiZIDE (GLUCOTROL XL) 2.5 MG 24 hr tablet Take 1 tablet (2.5 mg total) by mouth daily with breakfast. 90 tablet 0   omeprazole (PRILOSEC) 40 MG capsule Take 1 capsule (40 mg total) by mouth daily. 90 capsule 0   potassium chloride (KLOR-CON) 10 MEQ tablet Take 1 tablet (10 mEq total) by mouth daily. 90 tablet 3   rivaroxaban (XARELTO) 20 MG TABS tablet Take 1 tablet (20 mg total) by mouth every evening with supper. 90 tablet 1   tamsulosin (FLOMAX) 0.4 MG CAPS capsule Take 1 capsule (0.4 mg total) by mouth daily. 90 capsule 1   spironolactone (ALDACTONE) 25 MG tablet Take 1 tablet (25 mg total) by mouth daily. 30 tablet 0   No facility-administered medications prior to visit.    Allergies  Allergen Reactions   Iodine Swelling    Neck, gland swelling   Iohexol Swelling    Neck, gland swelling   Metformin And Related Diarrhea    ROS See HPI    Objective:    Physical Exam Constitutional:      General: He is not in acute distress.    Appearance: He is well-developed.  HENT:     Head: Normocephalic and atraumatic.  Cardiovascular:     Rate and Rhythm: Normal rate and regular rhythm.     Heart sounds: No murmur heard. Pulmonary:     Effort: Pulmonary effort is normal. No respiratory distress.     Breath sounds: Wheezing (bilateral expiratory wheeze) present. No rales.  Abdominal:     Comments: Firm, mildly distended  Musculoskeletal:     Right lower leg: 3+ Edema present.     Left lower leg: 3+ Edema present.  Skin:    General: Skin is warm and dry.  Neurological:      Mental Status: He is alert and oriented to person, place, and time.  Psychiatric:        Behavior: Behavior normal.        Thought Content: Thought content normal.      BP 106/60   Pulse 73   Temp 98 F (36.7 C)   Resp 18   Ht 5\' 7"  (1.702 m)   Wt 179 lb 3.2 oz (81.3 kg)   SpO2 98%   BMI 28.07 kg/m  Wt Readings from Last 3 Encounters:  05/09/23 179 lb 3.2 oz (81.3 kg)  04/25/23 170 lb 3.1 oz (77.2 kg)  04/07/23 181 lb (82.1 kg)       Assessment & Plan:   Problem List Items Addressed This Visit       Unprioritized   Hyperlipidemia LDL goal <70 (Chronic)    Lab Results  Component Value Date   CHOL 127 10/07/2022   HDL 64 10/07/2022   LDLCALC 46 10/07/2022   LDLDIRECT 142.2 12/26/2008   TRIG 93 10/07/2022   CHOLHDL 2.0 10/07/2022        Relevant Medications   spironolactone (ALDACTONE) 25 MG tablet   Essential hypertension - Primary (Chronic)    BP Readings from Last 3 Encounters:  05/09/23 106/60  04/25/23 110/87  04/07/23 (!) 87/65  Relevant Medications   spironolactone (ALDACTONE) 25 MG tablet   Diabetes type 2, controlled (HCC)    Lab Results  Component Value Date   HGBA1C 6.7 (H) 04/03/2023  A1C at goal. Continue Glipizide.      COPD GOLD II     + wheezing today- I think that wheezing is likely worsened by volume overload.  Diurese as noted.  He has Breztri on hand for prn use as well as albuterol.       Acute on chronic diastolic heart failure (HCC)    Wt Readings from Last 3 Encounters:  05/09/23 179 lb 3.2 oz (81.3 kg)  04/25/23 170 lb 3.1 oz (77.2 kg)  04/07/23 181 lb (82.1 kg)  Weight appears to be up 9 pounds since discharge.  I think he is storing some fluid in his abdomen in addition to his LE edema.   -Increase Furosemide to 40mg  twice daily for three days, then return to 40mg  daily. -Continue Aldactone 25mg  daily. -Check potassium levels today due to increased diuretic use. -pt is advised to purchase a scale at home and  weigh himself daily.  -Attempt to schedule earlier follow-up with cardiologist. -will request care management from nursing for fluid and CHF management.      Relevant Medications   spironolactone (ALDACTONE) 25 MG tablet   Other Relevant Orders   Basic Metabolic Panel (BMET)   AMB Referral to Community Care Coordinaton ([facility] Patients)    I am having Robert Bates maintain his acetaminophen, Breztri Aerosphere, albuterol, tamsulosin, fluticasone, amiodarone, rivaroxaban, gabapentin, atorvastatin, furosemide, clotrimazole-betamethasone, glipiZIDE, ASCORBIC ACID PO, potassium chloride, omeprazole, and spironolactone.  Meds ordered this encounter  Medications   spironolactone (ALDACTONE) 25 MG tablet    Sig: Take 1 tablet (25 mg total) by mouth daily.    Dispense:  90 tablet    Refill:  1    Order Specific Question:   Supervising Provider    Answer:   Danise Edge A [[id number]]

## 2023-05-09 NOTE — Assessment & Plan Note (Addendum)
Wt Readings from Last 3 Encounters:  05/09/23 179 lb 3.2 oz (81.3 kg)  04/25/23 170 lb 3.1 oz (77.2 kg)  04/07/23 181 lb (82.1 kg)   Weight appears to be up 9 pounds since discharge.  I think he is storing some fluid in his abdomen in addition to his LE edema.   -Increase Furosemide to 40mg  twice daily for three days, then return to 40mg  daily. -Continue Aldactone 25mg  daily. -Check potassium levels today due to increased diuretic use. -pt is advised to purchase a scale at home and weigh himself daily.  -Attempt to schedule earlier follow-up with cardiologist. -will request care management from nursing for fluid and CHF management.

## 2023-05-14 NOTE — Telephone Encounter (Signed)
Spoke with niece.  Updated her on medical plan and questions answered.

## 2023-05-16 ENCOUNTER — Ambulatory Visit (HOSPITAL_BASED_OUTPATIENT_CLINIC_OR_DEPARTMENT_OTHER)
Admission: RE | Admit: 2023-05-16 | Discharge: 2023-05-16 | Disposition: A | Discharge status: Home / Self Care | Payer: Medicare Other | Source: Ambulatory Visit | Attending: Family | Admitting: Family

## 2023-05-16 ENCOUNTER — Encounter (HOSPITAL_BASED_OUTPATIENT_CLINIC_OR_DEPARTMENT_OTHER): Payer: Self-pay

## 2023-05-16 ENCOUNTER — Ambulatory Visit (INDEPENDENT_AMBULATORY_CARE_PROVIDER_SITE_OTHER): Payer: Medicare Other | Admitting: Family

## 2023-05-16 ENCOUNTER — Encounter: Payer: Self-pay | Admitting: Family

## 2023-05-16 ENCOUNTER — Other Ambulatory Visit (HOSPITAL_BASED_OUTPATIENT_CLINIC_OR_DEPARTMENT_OTHER): Payer: Self-pay

## 2023-05-16 VITALS — BP 121/69 | HR 73 | Ht 67.0 in | Wt 175.0 lb

## 2023-05-16 DIAGNOSIS — M545 Low back pain, unspecified: Secondary | ICD-10-CM | POA: Diagnosis not present

## 2023-05-16 DIAGNOSIS — J90 Pleural effusion, not elsewhere classified: Secondary | ICD-10-CM

## 2023-05-16 DIAGNOSIS — J449 Chronic obstructive pulmonary disease, unspecified: Secondary | ICD-10-CM

## 2023-05-16 MED ORDER — METHYLPREDNISOLONE 4 MG PO TBPK
ORAL_TABLET | ORAL | 0 refills | Status: DC
Start: 2023-05-16 — End: 2023-06-17
  Filled 2023-05-16: qty 21, 6d supply, fill #0

## 2023-05-16 NOTE — Progress Notes (Unsigned)
Subjective:     Patient ID: Robert Bates, male    DOB: 10-Mar-1941, 82 y.o.   MRN: [id number]  Chief Complaint  Patient presents with   Back Pain     Lower back started 05/14/23, throbbing, sharp pain when walking [place] this morning     HPI  Discussed the use of AI scribe software for clinical note transcription with the patient, who gave verbal consent to proceed.  History of Present Illness         Patient is an 82 yr old male who presents today with c/o severe low back pain.  Symptoms began on 7/10.  He reports feeling that his legs are weak.  He had a fall this AM without injury.  Reports some numbness in both anterior thigh areas.   He denies saddle paresthesia or bowel/bladder incontinence.    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   COPD (chronic obstructive pulmonary disease) (HCC)    Coronary artery disease    Diabetes type 2, controlled (HCC) 07/31/2017   Dyspnea    Dysrhythmia    a fib   GERD (gastroesophageal reflux disease)    Hematuria    refuses work up or referral - understands risks of morbidity / mortality - 11/2008, 12/2008   Heme positive stool    History of hiatal hernia    History of kidney stones    Hyperlipemia    Meningioma (HCC) 10/25/2013   Follows with Dr. Coletta Memos.    Peripheral vascular disease (HCC)    Abdominal Aortic Aneursym   Pneumonia    as a child   Radiation 09/18/15-10/25/15   left lower lobe 70.2 Gy   Seizures (HCC) 02/18/2020    Tobacco abuse     Past Surgical History:  Procedure Laterality Date   CHOLECYSTECTOMY N/A 07/23/2017   Procedure: LAPAROSCOPIC CHOLECYSTECTOMY;  Surgeon: Rodman Pickle, MD;  Location: [facility];  Service: General;  Laterality: N/A;   CLIPPING OF ATRIAL APPENDAGE Left 05/08/2021   Procedure: CLIPPING OF ATRIAL APPENDAGE USING 45 ATRICLIP;  Surgeon: Linden Dolin, MD;  Location: [facility];  Service: Open Heart Surgery;  Laterality: Left;   COLONOSCOPY     CORONARY ARTERY BYPASS GRAFT N/A 05/08/2021   Procedure: CORONARY ARTERY BYPASS GRAFTING (CABG)X 3 USING LEFT INTERNAL MAMMARY ARTERY AND RIGHT GREATER SAPEHNOUS VEIN;  Surgeon: Linden Dolin, MD;  Location: [facility];  Service: Open Heart Surgery;  Laterality: N/A;   ENDOVEIN HARVEST OF GREATER SAPHENOUS VEIN Right 05/08/2021   Procedure: ENDOVEIN HARVEST OF GREATER SAPHENOUS VEIN;  Surgeon: Linden Dolin, MD;  Location: [facility];  Service: Open Heart Surgery;  Laterality: Right;   EYE SURGERY Bilateral    Cataracts removed w/ lens implant   HERNIA REPAIR     Left 36 years ago . Right inguinal hernia repair 10-01-17 Dr. Sheliah Hatch   INGUINAL HERNIA REPAIR Right 10/01/2017   Procedure: RIGHT INGUINAL HERNIA REPAIR  WITH MESH;  Surgeon: Kinsinger, De Blanch, MD;  Location: [facility];  Service: General;  Laterality: Right;  TAP BLOCK   INSERTION OF MESH Right 10/01/2017   Procedure: INSERTION OF MESH;  Surgeon: Sheliah Hatch De Blanch, MD;  Location: [facility];  Service: General;  Laterality: Right;   IR THORACENTESIS ASP PLEURAL SPACE W/IMG GUIDE  05/18/2021   IR THORACENTESIS ASP PLEURAL SPACE W/IMG GUIDE  06/07/2021   LEFT HEART CATH AND CORONARY ANGIOGRAPHY N/A 04/20/2021   Procedure: LEFT HEART CATH AND CORONARY ANGIOGRAPHY;  Surgeon: Iran Ouch, MD;  [facility];  Service: Cardiovascular;  Laterality: N/A;   TEE WITHOUT CARDIOVERSION N/A 05/08/2021   Procedure: TRANSESOPHAGEAL ECHOCARDIOGRAM (TEE);  Surgeon: Linden Dolin,  MD;  Location: [facility] OR;  Service: Open Heart Surgery;  Laterality: N/A;   TONSILLECTOMY     TONSILLECTOMY     VIDEO BRONCHOSCOPY Bilateral 07/26/2015   Procedure: VIDEO BRONCHOSCOPY WITH FLUORO;  Surgeon: Nyoka Cowden, MD;  [facility]: [facility];  Service: Cardiopulmonary;  Laterality: Bilateral;   VIDEO BRONCHOSCOPY WITH ENDOBRONCHIAL NAVIGATION N/A 08/23/2015   Procedure: VIDEO BRONCHOSCOPY WITH ENDOBRONCHIAL NAVIGATION;  Surgeon: Delight Ovens, MD;  [facility];  Service: Thoracic;  Laterality: N/A;   VIDEO BRONCHOSCOPY WITH ENDOBRONCHIAL ULTRASOUND N/A 08/23/2015   Procedure: VIDEO BRONCHOSCOPY WITH ENDOBRONCHIAL ULTRASOUND;  Surgeon: Delight Ovens, MD;  Location: [facility];  Service: Thoracic;  Laterality: N/A;    Family History  Problem Relation Age of Onset   Leukemia Father    Emphysema Father    Learning disabilities Son    Atrial fibrillation Son    Leukemia Other    Stroke Other     Social History   Socioeconomic History   Marital status: Widowed    Spouse name: Not on file   Number of children: 2   Years of education: Not on file   Highest education level: Not on file  Occupational History   Occupation: Retired    Associate Professor: [employer]    Comment: truck Air traffic controller: [employer]  Tobacco Use   Smoking status: Former    Current packs/day: 0.00    Average packs/day: 1 pack/day for 57.0 years (57.0 ttl pk-yrs)    Types: Cigarettes, Cigars    Start date: 08/07/1958    Quit date: 08/08/2015    Years since quitting: 7.7   Smokeless tobacco: Former    Types: Chew    Quit date: 11/04/1958   Tobacco comments:    Will smoke cigar every once in awhile.  Vaping daily started a couple weeks ago.  04/18/22 hfb  Vaping Use   Vaping status: Some Days  Substance and Sexual Activity   Alcohol use: Not Currently    Alcohol/week: 0.0 standard drinks of alcohol   Drug use: No   Sexual activity: Not Currently  Other Topics Concern   Not on file  Social History  Narrative   Not on file   Social Determinants of Health   Financial Resource Strain: Medium Risk (01/03/2023)   Overall Financial Resource Strain (CARDIA)    Difficulty of Paying Living Expenses: Somewhat hard  Food Insecurity: No Food Insecurity (04/29/2023)   Hunger Vital Sign    Worried About Running Out of Food in the Last Year: Never true    Ran Out of Food in the Last Year: Never true  Transportation Needs: No Transportation Needs (04/29/2023)   PRAPARE - Administrator, Civil Service (Medical): No  Lack of Transportation (Non-Medical): No  Physical Activity: Insufficiently Active (08/05/2021)   Exercise Vital Sign    Days of Exercise per Week: 2 days    Minutes of Exercise per Session: 40 min  Stress: No Stress Concern Present (10/01/2021)   [employer] of Occupational Health - Occupational Stress Questionnaire    Feeling of Stress : Not at all  Social Connections: Moderately Isolated (05/13/2022)   Social Connection and Isolation Panel [NHANES]    Frequency of Communication with Friends and Family: Twice a week    Frequency of Social Gatherings with Friends and Family: Once a week    Attends Religious Services: Never    Database administrator or Organizations: No    Attends Banker Meetings: Never    Marital Status: Married  Catering manager Violence: Not At Risk (04/22/2023)   Humiliation, Afraid, Rape, and Kick questionnaire    Fear of Current or Ex-Partner: No    Emotionally Abused: No    Physically Abused: No    Sexually Abused: No    Outpatient Medications Prior to Visit  Medication Sig Dispense Refill   acetaminophen (TYLENOL) 500 MG tablet Take 1-2 tablets (500-1,000 mg total) by mouth every 6 (six) hours as needed. (Patient taking differently: Take 1,000 mg by mouth daily as needed for headache, fever or moderate pain.) 30 tablet 0   albuterol (PROVENTIL) (2.5 MG/3ML) 0.083% nebulizer solution Take 3 mLs (2.5 mg total) by nebulization  every 6 (six) hours as needed for wheezing or shortness of breath. 75 mL 12   amiodarone (PACERONE) 200 MG tablet Take 1/2 tablet (100mg ) by mouth three days per week (M,W,F) (Patient taking differently: Take 100 mg by mouth every Monday, Wednesday, and Friday.) 6 tablet 3   ASCORBIC ACID PO Take 1 tablet by mouth daily. Vitamin C, unknown strength.     atorvastatin (LIPITOR) 80 MG tablet Take 1 tablet (80 mg total) by mouth daily. 90 tablet 2   Budeson-Glycopyrrol-Formoterol (BREZTRI AEROSPHERE) 160-9-4.8 MCG/ACT AERO Inhale 2 puffs into the lungs in the morning and at bedtime. (Patient taking differently: Inhale 2 puffs into the lungs 2 (two) times daily as needed (wheezing, shortness of breath).) 10.7 g 5   clotrimazole-betamethasone (LOTRISONE) cream Apply 1 Application topically 2 (two) times daily. 45 g 2   fluticasone (FLONASE) 50 MCG/ACT nasal spray Place 2 sprays into both nostrils daily. 16 g 1   furosemide (LASIX) 40 MG tablet Take 1 tablet (40 mg total) by mouth daily. 90 tablet 0   gabapentin (NEURONTIN) 300 MG capsule Take 1 capsule (300 mg total) by mouth 3 (three) times daily. (Patient taking differently: Take 300 mg by mouth 2 (two) times daily.) 90 capsule 5   glipiZIDE (GLUCOTROL XL) 2.5 MG 24 hr tablet Take 1 tablet (2.5 mg total) by mouth daily with breakfast. 90 tablet 0   omeprazole (PRILOSEC) 40 MG capsule Take 1 capsule (40 mg total) by mouth daily. 90 capsule 0   potassium chloride (KLOR-CON) 10 MEQ tablet Take 1 tablet (10 mEq total) by mouth daily. 90 tablet 3   rivaroxaban (XARELTO) 20 MG TABS tablet Take 1 tablet (20 mg total) by mouth every evening with supper. 90 tablet 1   spironolactone (ALDACTONE) 25 MG tablet Take 1 tablet (25 mg total) by mouth daily. 90 tablet 1   tamsulosin (FLOMAX) 0.4 MG CAPS capsule Take 1 capsule (0.4 mg total) by mouth daily. 90 capsule 1   No facility-administered medications prior to visit.  Allergies  Allergen Reactions   Iodine  Swelling    Neck, gland swelling   Iohexol Swelling    Neck, gland swelling   Metformin And Related Diarrhea    ROS    See HPI Objective:    Physical Exam Constitutional:      General: He is not in acute distress.    Appearance: He is well-developed.  HENT:     Head: Normocephalic and atraumatic.  Cardiovascular:     Rate and Rhythm: Normal rate and regular rhythm.     Heart sounds: No murmur heard. Pulmonary:     Effort: Pulmonary effort is normal. No respiratory distress.     Breath sounds: Wheezing (left sided expiratory wheeze) present. No rales.  Skin:    General: Skin is warm and dry.  Neurological:     Mental Status: He is alert and oriented to person, place, and time.     Deep Tendon Reflexes:     Reflex Scores:      Patellar reflexes are 2+ on the right side and 2+ on the left side.      Achilles reflexes are 2+ on the right side and 2+ on the left side.    Comments: Bilateral LE strength is 4/5  Psychiatric:        Behavior: Behavior normal.        Thought Content: Thought content normal.      BP 121/69   Pulse 73   Ht 5\' 7"  (1.702 m)   Wt 175 lb (79.4 kg)   SpO2 100%   BMI 27.41 kg/m  Wt Readings from Last 3 Encounters:  05/16/23 175 lb (79.4 kg)  05/09/23 179 lb 3.2 oz (81.3 kg)  04/25/23 170 lb 3.1 oz (77.2 kg)       Assessment & Plan:   Problem List Items Addressed This Visit       Unprioritized   Low back pain - Primary   Relevant Medications   methylPREDNISolone (MEDROL DOSEPAK) 4 MG TBPK tablet   Other Relevant Orders   MR Lumbar Spine Wo Contrast   COPD GOLD II     Wheezing on exam today.  Steroid pak should also help with his wheezing.        Relevant Medications   methylPREDNISolone (MEDROL DOSEPAK) 4 MG TBPK tablet   I am concerned about his fall and his LE weakness that he could have some spinal cord compression.  I would like to start him on a medrol dose pak and order MRI as soon as possible. Pt is advised to go to the  ER if increased weakness, saddle paresthesia, bowel or bladder incontinence.  I am having [name] start on methylPREDNISolone. I am also having him maintain his acetaminophen, Breztri Aerosphere, albuterol, fluticasone, amiodarone, rivaroxaban, gabapentin, atorvastatin, furosemide, clotrimazole-betamethasone, glipiZIDE, ASCORBIC ACID PO, potassium chloride, omeprazole, spironolactone, and tamsulosin.  Meds ordered this encounter  Medications   methylPREDNISolone (MEDROL DOSEPAK) 4 MG TBPK tablet    Sig: Take 6 tablets (60 mg total) by mouth on day 1, then 5 tablets (50 mg total) on day 2, then 4 tablets (40 mg total) on day 3, then 3 tablets (30 mg total) on day 4, then 2 tablets (20 mg total) on day 5, and then 1 tablet (10 mg total) on day 6.    Dispense:  21 tablet    Refill:  0    Order Specific Question:   Supervising Provider    Answer:   Danise Edge  A [[id number]]

## 2023-05-17 NOTE — Assessment & Plan Note (Signed)
Wheezing on exam today.  Steroid pak should also help with his wheezing.

## 2023-05-19 ENCOUNTER — Ambulatory Visit: Payer: Self-pay

## 2023-05-19 NOTE — Patient Outreach (Signed)
  Care Coordination   Initial Visit Note   05/19/2023 Name: Robert Bates MRN: [id number] DOB: [birth year]  Robert Bates is a 82 y.o. year old male who sees Sandford Craze, NP for primary care. I spoke with Niece(dpr) Robert Bates by phone today.  What matters to the patients health and wellness today?  Per Robert Bates, patient on the way to her house. She reports she is a retired Administrator, arts and has been helping him with medication management and managing his health.  She states that Robert Bates is taking medications as prescribed. She reports patient has scales and has started weighing himself. She report he is looking for another place to live due to family discord with his son.She states he just lost his wife March 2024. Patient did not arrive at home prior to end of conversation. Confirmed patient contact number is [phone]  Goals Addressed             This Visit's Progress    contnue to improve post hospitalization       Interventions Today    Flowsheet Row Most Recent Value  Chronic Disease   Chronic disease during today's visit Congestive Heart Failure (CHF)  General Interventions   General Interventions Discussed/Reviewed General Interventions Discussed, Doctor Visits, Communication with  Doctor Visits Discussed/Reviewed Doctor Visits Discussed, PCP  PCP/Specialist Visits Compliance with follow-up visit  Communication with Social Work  [community resources-Per [place], patient recently lost wife 2024. she states son lives with him and he is looking for another place to live due to family discord. per neice, patient is HOH.]  Pharmacy Interventions   Pharmacy Dicussed/Reviewed Pharmacy Topics Discussed            SDOH assessments and interventions completed:  Yes  SDOH Interventions Today    Flowsheet Row Most Recent Value  SDOH Interventions   Food Insecurity Interventions Intervention Not Indicated  Housing Interventions Intervention Not Indicated   Transportation Interventions Intervention Not Indicated  Utilities Interventions Intervention Not Indicated     Care Coordination Interventions:  Yes, provided   Follow up plan: Follow up call scheduled for 06/11/23    Encounter Outcome:  Pt. Visit Completed   Kathyrn Sheriff, RN, MSN, BSN, CCM [facility] Care Coordinator [phone]

## 2023-05-19 NOTE — Patient Instructions (Addendum)
Visit Information  Thank you for taking time to visit with me today. Please don't hesitate to contact me if I can be of assistance to you.   Following are the goals we discussed today:  Attend provider visits as scheduled/recommended Take medications as prescribed Weigh and record weights as recommended Contact provider with health questions or concerns   Our next appointment is by telephone on 06/11/23 at 2:30 pm  Please call the care guide team at [phone] if you need to cancel or reschedule your appointment.   If you are experiencing a Mental Health or Behavioral Health Crisis or need someone to talk to, please call the Suicide and Crisis Lifeline: 55  Kathyrn Sheriff, RN, MSN, BSN, CCM [facility] Care Coordinator [phone]

## 2023-05-20 ENCOUNTER — Telehealth: Payer: Self-pay

## 2023-05-20 ENCOUNTER — Ambulatory Visit (HOSPITAL_COMMUNITY): Admission: RE | Admit: 2023-05-20 | Payer: Medicare Other | Source: Ambulatory Visit

## 2023-05-20 ENCOUNTER — Other Ambulatory Visit (HOSPITAL_BASED_OUTPATIENT_CLINIC_OR_DEPARTMENT_OTHER): Payer: Self-pay

## 2023-05-20 ENCOUNTER — Ambulatory Visit (HOSPITAL_COMMUNITY)
Admission: RE | Admit: 2023-05-20 | Discharge: 2023-05-20 | Disposition: A | Discharge status: Home / Self Care | Payer: Medicare Other | Source: Ambulatory Visit | Attending: Family | Admitting: Family

## 2023-05-20 ENCOUNTER — Other Ambulatory Visit: Payer: Self-pay | Admitting: Family

## 2023-05-20 DIAGNOSIS — M5135 Other intervertebral disc degeneration, thoracolumbar region: Secondary | ICD-10-CM | POA: Diagnosis not present

## 2023-05-20 DIAGNOSIS — M48061 Spinal stenosis, lumbar region without neurogenic claudication: Secondary | ICD-10-CM | POA: Diagnosis not present

## 2023-05-20 DIAGNOSIS — I77811 Abdominal aortic ectasia: Secondary | ICD-10-CM | POA: Diagnosis not present

## 2023-05-20 DIAGNOSIS — M545 Low back pain, unspecified: Secondary | ICD-10-CM | POA: Diagnosis not present

## 2023-05-20 DIAGNOSIS — M5136 Other intervertebral disc degeneration, lumbar region: Secondary | ICD-10-CM | POA: Diagnosis not present

## 2023-05-20 MED ORDER — FUROSEMIDE 40 MG PO TABS
40.0000 mg | ORAL_TABLET | Freq: Every day | ORAL | 0 refills | Status: DC
  Filled 2023-05-20 – 2023-05-22 (×2): qty 90, 90d supply, fill #0

## 2023-05-20 NOTE — Telephone Encounter (Signed)
FYI Pt called back to advise he got MRI done like he is supposed to

## 2023-05-20 NOTE — Telephone Encounter (Signed)
Spoke with pt.  Advised him to directly to the ER for further evaluation and expedited MRI to rule out spinal cord compression.  Next available outpatient MRI is not until the weekend.  Pt was hesitant but ultimately agreed to plan.

## 2023-05-20 NOTE — Telephone Encounter (Signed)
Patient called earlier today to let us know he cancelled MRI appointment today due to falling while taking a shower this morning.

## 2023-05-21 ENCOUNTER — Telehealth: Payer: Self-pay | Admitting: Family

## 2023-05-21 ENCOUNTER — Other Ambulatory Visit (HOSPITAL_BASED_OUTPATIENT_CLINIC_OR_DEPARTMENT_OTHER): Payer: Self-pay

## 2023-05-21 NOTE — Telephone Encounter (Signed)
Patient notified per pcp he will be referred to neurology.   Also we received his farxiga sample bottle with 90 tablets and it will be at the front for pick up.

## 2023-05-21 NOTE — Telephone Encounter (Signed)
Please advise pt that his

## 2023-05-22 ENCOUNTER — Ambulatory Visit: Payer: Self-pay | Admitting: Licensed Clinical Social Worker

## 2023-05-22 ENCOUNTER — Telehealth: Payer: Self-pay | Admitting: Family

## 2023-05-22 ENCOUNTER — Other Ambulatory Visit (HOSPITAL_BASED_OUTPATIENT_CLINIC_OR_DEPARTMENT_OTHER): Payer: Self-pay

## 2023-05-22 DIAGNOSIS — M47816 Spondylosis without myelopathy or radiculopathy, lumbar region: Secondary | ICD-10-CM

## 2023-05-22 NOTE — Patient Outreach (Signed)
  Care Coordination  Initial Visit Note   05/22/2023 Name: Robert Bates MRN: [id number] DOB: 1941/02/08  Robert Bates is a 82 y.o. year old male who sees Robert Craze, NP for primary care. I spoke with  Robert Bates by phone today.  What matters to the patients health and wellness today?  Finding new housing  Patient is currently living with his son in a mobile home. Has lived at current location for about 30 years.  Wife passed away and he would  like to change his living situation.     Goals Addressed             This Visit's Progress    COMPLETED: explore housing options       Activities and task to complete in order to accomplish goals.   Keep all upcoming appointment discussed today Continue with compliance of taking medication prescribed by Doctor You have expressed an interest in housing options, I have scheduled you with Robert Bates to discuss additional options with you.   You have also agreed to go visit the apartment your niece told you about that is $500.         SDOH assessments and interventions completed:  Yes  SDOH Interventions Today    Flowsheet Row Most Recent Value  SDOH Interventions   Financial Strain Interventions Intervention Not Indicated  Stress Interventions Provide Counseling       Care Coordination Interventions:  Yes, provided  Interventions Today    Flowsheet Row Most Recent Value  Chronic Disease   Chronic disease during today's visit Congestive Heart Failure (CHF), Hypertension (HTN), Chronic Obstructive Pulmonary Disease (COPD)  General Interventions   General Interventions Discussed/Reviewed General Interventions Reviewed, Level of Care, Communication with  Communication with RN  Level of Care Assisted Living  [not interested in assisted living options]  Education Interventions   Education Provided Provided Education  Provided Verbal Education On Mental Health/Coping with Illness  Mental Health Interventions   Mental Health  Discussed/Reviewed Mental Health Discussed, Grief and Loss  [active listening, solution focused, emotional support]  Advanced Directive Interventions   Advanced Directives Discussed/Reviewed Advanced Directives Discussed  Robert Bates is HPOA and has documents]       Follow up plan:  No further intervention required by LCSW Referral made to BSW to assist and explore housing options with patient.   Encounter Outcome:  Pt. Visit Completed

## 2023-05-22 NOTE — Patient Instructions (Signed)
Social Work Visit Information  Thank you for taking time to visit with me today. Please don't hesitate to contact me if I can be of assistance to you.   Following are the goals we discussed today:   Goals Addressed             This Visit's Progress    COMPLETED: explore housing options       Activities and task to complete in order to accomplish goals.   Keep all upcoming appointment discussed today Continue with compliance of taking medication prescribed by Doctor You have expressed an interest in housing options, I have scheduled you with Enrique Sack to discuss additional options with you.   You have also agreed to go visit the apartment your niece told you about that is $500.          No follow up scheduled with social work at this time. Patient will call office if needed. Phone appointment scheduled with BSW  Please call the care guide team at [phone] if you need to cancel or reschedule your appointment.   If you or anyone you know are experiencing a Mental Health or Behavioral Health Crisis or need someone to talk to, please call the Suicide and Crisis Lifeline: 988 call the [place] National Suicide Prevention Lifeline: 830-255-3728 or TTY: 475-818-1547 TTY (936)670-5733) to talk to a trained counselor call 1-800-273-TALK (toll free, 24 hour hotline) go to [facility] [address], [place] [phone])   Patient verbalizes understanding of instructions and care plan provided today and agrees to view in MyChart. Active MyChart status and patient understanding of how to access instructions and care plan via MyChart confirmed with patient.       Sammuel Hines, LCSW Social Work Care Coordination  [facility]ie [employer] [employer] [phone]

## 2023-05-22 NOTE — Telephone Encounter (Signed)
Pt called to advise that he picked up farxiga 5 mg from the pharmacy but in the past it has given him a yeast infection so he is not sure why he's getting again. Pt is going to hold off on taking it until he hears from ruth. Please advise.

## 2023-05-22 NOTE — Telephone Encounter (Signed)
See MRI- will refer to neurosurgery- pt already notified.

## 2023-05-23 ENCOUNTER — Telehealth: Payer: Self-pay | Admitting: Family

## 2023-05-23 ENCOUNTER — Other Ambulatory Visit (HOSPITAL_BASED_OUTPATIENT_CLINIC_OR_DEPARTMENT_OTHER): Payer: Self-pay

## 2023-05-23 MED ORDER — TRAMADOL HCL 50 MG PO TABS
50.0000 mg | ORAL_TABLET | Freq: Three times a day (TID) | ORAL | 0 refills | Status: AC | PRN
  Filled 2023-05-23: qty 15, 5d supply, fill #0

## 2023-05-23 NOTE — Telephone Encounter (Signed)
Patient called and is asking if something can be sent in for his back pain. Please call patient

## 2023-05-23 NOTE — Addendum Note (Signed)
Addended by: Sandford Craze on: 05/23/2023 04:42 PM   Modules accepted: Orders

## 2023-05-23 NOTE — Telephone Encounter (Signed)
He should remain off of farxiga.   Pt is also requesting rx for pain. I can send a 5 day supply of medicine for pain. Do not drive after taking. He will need office visit prior to any additional refills.  Hopefully he won't need it long term.

## 2023-05-23 NOTE — Telephone Encounter (Signed)
Addressed in a different phone note

## 2023-05-25 ENCOUNTER — Ambulatory Visit (HOSPITAL_BASED_OUTPATIENT_CLINIC_OR_DEPARTMENT_OTHER): Payer: Medicare Other

## 2023-05-27 DIAGNOSIS — M5451 Vertebrogenic low back pain: Secondary | ICD-10-CM | POA: Diagnosis not present

## 2023-05-28 ENCOUNTER — Other Ambulatory Visit (HOSPITAL_BASED_OUTPATIENT_CLINIC_OR_DEPARTMENT_OTHER): Payer: Self-pay

## 2023-05-28 ENCOUNTER — Ambulatory Visit (HOSPITAL_BASED_OUTPATIENT_CLINIC_OR_DEPARTMENT_OTHER): Payer: Medicare Other

## 2023-05-29 ENCOUNTER — Telehealth: Payer: Self-pay | Admitting: Family

## 2023-05-29 NOTE — Telephone Encounter (Signed)
Could you please call Dr. Lowry Bowl office and leave the following message for him:  Mr. Niven may hold his xarelto for 24 to 48 hours prior to planned procedure then resume post operatively.

## 2023-05-29 NOTE — Telephone Encounter (Signed)
Left detailed message on Erie Noe (Dr. Joesphine Bare scheduler) about message below.

## 2023-05-30 ENCOUNTER — Telehealth: Payer: Self-pay | Admitting: Family

## 2023-05-30 ENCOUNTER — Ambulatory Visit: Payer: Self-pay

## 2023-05-30 DIAGNOSIS — G8929 Other chronic pain: Secondary | ICD-10-CM

## 2023-05-30 NOTE — Patient Instructions (Signed)
Visit Information  Thank you for taking time to visit with me today. Please don't hesitate to contact me if I can be of assistance to you.   Following are the goals we discussed today:  - Follow up with your primary care provider regarding pain level   If you are experiencing a Mental Health or Behavioral Health Crisis or need someone to talk to, please call the Suicide and Crisis Lifeline: 988 go to [facility] [address], [place] [phone]) call 911  Patient verbalizes understanding of instructions and care plan provided today and agrees to view in MyChart. Active MyChart status and patient understanding of how to access instructions and care plan via MyChart confirmed with patient.     Bevelyn Ngo, BSW, CDP Social Worker, Certified Dementia Practitioner [facility] Care Management  Care Coordination [phone]

## 2023-05-30 NOTE — Telephone Encounter (Signed)
Pt called stating that the tramadol is not helping with his back and hip pain an wanted to look into something else

## 2023-05-30 NOTE — Patient Outreach (Signed)
  Care Coordination   Follow Up Visit Note   05/30/2023 Name: Zadkiel Rasmussen MRN: [id number] DOB: [birth year]  Derly Hodgkiss is a 82 y.o. year old male who sees Sandford Craze, NP for primary care. I spoke with  [name] by phone today.  What matters to the patients health and wellness today?  Pain while standing    Goals Addressed             This Visit's Progress    Care  Coordination Activities       Care Coordination Interventions: Collaboration with LCSW Sammuel Hines who requests BSW assistance with housing goals Determined the patient is interested in moving, his budget is $700 or less. He reports he has not gone to look at the apartment his niece previously told him about Educated the patient on housing options advising most low income apartments or senior housing have waiting lists Assessed for safety in the home - patient reports he is safe in his current home Determined the patient is thinking of buying a camper and living in it, he is just unsure where to have the camper dwell Encouraged the patient to go visit the apartment his niece found to determine if it is a suitable option Discussed the patient has been in  a lot of pain when standing or walking which is why he has yet to tour it Confirmed patient has been in contact with her medical team regarding back pain; patient reports his niece is on the way to his home and will transport him to the providers office if needed         SDOH assessments and interventions completed:  No     Care Coordination Interventions:  Yes, provided   Interventions Today    Flowsheet Row Most Recent Value  Chronic Disease   Chronic disease during today's visit Chronic Obstructive Pulmonary Disease (COPD), Hypertension (HTN), Congestive Heart Failure (CHF)  General Interventions   General Interventions Discussed/Reviewed General Interventions Discussed        Follow up plan:  SW will continue to follow.     Encounter Outcome:  Pt. Visit Completed   Bevelyn Ngo, BSW, CDP Social Worker, Certified Dementia Practitioner [facility] Care Management  Care Coordination [phone]

## 2023-06-02 NOTE — Telephone Encounter (Signed)
I will place a referral to pain management.  I also let his surgeon know that it is ok for them to hold his blood thinner to do spinal injections.  Make sure he is also taking the gabapentin.

## 2023-06-02 NOTE — Addendum Note (Signed)
Addended by: Sandford Craze on: 06/02/2023 07:17 AM   Modules accepted: Orders

## 2023-06-02 NOTE — Telephone Encounter (Signed)
Patient notified of this information and referral to pain management.

## 2023-06-03 ENCOUNTER — Ambulatory Visit: Payer: Self-pay

## 2023-06-03 NOTE — Patient Instructions (Signed)
Visit Information  Thank you for taking time to visit with me today. Please don't hesitate to contact me if I can be of assistance to you.   Following are the goals we discussed today:  - Tour apartment with your niece today to see if it is a good option for you - Continue wearing your back brace as directed by your provider - Contact your primary care providers office as needed  Our next appointment is by telephone on 8/9 at 12:30 pm  Please call the care guide team at [phone] if you need to cancel or reschedule your appointment.   If you are experiencing a Mental Health or Behavioral Health Crisis or need someone to talk to, please call the Suicide and Crisis Lifeline: 988 call 911  Patient verbalizes understanding of instructions and care plan provided today and agrees to view in MyChart. Active MyChart status and patient understanding of how to access instructions and care plan via MyChart confirmed with patient.     Bevelyn Ngo, BSW, CDP Social Worker, Certified Dementia Practitioner [facility] Care Management  Care Coordination [phone]

## 2023-06-03 NOTE — Patient Outreach (Signed)
  Care Coordination   Follow Up Visit Note   06/03/2023 Name: Robert Bates MRN: [id number] DOB: 20-Jan-1941  Robert Bates is a 82 y.o. year old male who sees Sandford Craze, NP for primary care. I spoke with  [name] by phone today.  What matters to the patients health and wellness today?  Identify affordable housing    Goals Addressed             This Visit's Progress    Care  Coordination Activities       Care Coordination Interventions: Discussed the patient has been wearing his back brace and his pain level is better controlled. He is en route with his niece to look at an apartment that is in his price range Determined the patient does not have a timeframe to leave his current place of residence but he is interested in moving since his wife passed away in the spring. Patient currently lives with his son Reviewed with the patient that most senior living apartments have long waiting lists. Patient continues to endorse that he may be interested in living in a camper if he is unable to find a better option Scheduled follow up call to the patient to assess outcome of today's apartment tour        SDOH assessments and interventions completed:  No     Care Coordination Interventions:  Yes, provided   Interventions Today    Flowsheet Row Most Recent Value  Chronic Disease   Chronic disease during today's visit Chronic Obstructive Pulmonary Disease (COPD), Hypertension (HTN), Congestive Heart Failure (CHF)  General Interventions   General Interventions Discussed/Reviewed General Interventions Reviewed        Follow up plan: Follow up call scheduled for 8/9    Encounter Outcome:  Pt. Visit Completed   Bevelyn Ngo, BSW, CDP Social Worker, Certified Dementia Practitioner [facility] Care Management  Care Coordination [phone]

## 2023-06-04 ENCOUNTER — Encounter (INDEPENDENT_AMBULATORY_CARE_PROVIDER_SITE_OTHER): Payer: Self-pay

## 2023-06-11 ENCOUNTER — Telehealth: Payer: Self-pay | Admitting: Family

## 2023-06-11 ENCOUNTER — Ambulatory Visit: Payer: Self-pay

## 2023-06-11 NOTE — Patient Instructions (Signed)
Visit Information  Thank you for taking time to visit with me today. Please don't hesitate to contact me if I can be of assistance to you.   Following are the goals we discussed today:   Goals Addressed             This Visit's Progress    contnue to improve post hospitalization       Interventions Today    Flowsheet Row Most Recent Value  Chronic Disease   Chronic disease during today's visit Other, Congestive Heart Failure (CHF)  [chronic back pain]  General Interventions   General Interventions Discussed/Reviewed General Interventions Reviewed, Doctor Visits  Doctor Visits Discussed/Reviewed Doctor Visits Reviewed  [provided name/contact number of providers that PCP has referred patient to. per review of chart: Dr. Coletta Memos [phone] and [facility] [phone]  Education Interventions   Education Provided Provided Education  Provided Verbal Education On --  [advised to continue to take medications as prescribed, encouraged to continue to communicate with provider as needed.]            Our next appointment is by telephone on 06/30/23 at 10:30 am  Please call the care guide team at [phone] if you need to cancel or reschedule your appointment.   If you are experiencing a Mental Health or Behavioral Health Crisis or need someone to talk to, please call the Suicide and Crisis Lifeline: 988 call the [place] National Suicide Prevention Lifeline: 872-530-0259 or TTY: 8386115423 TTY 959-062-6725) to talk to a trained counselor call 1-800-273-TALK (toll free, 24 hour hotline)  Kathyrn Sheriff, RN, MSN, BSN, CCM [facility] Care Coordinator [phone]

## 2023-06-11 NOTE — Telephone Encounter (Signed)
Pt called & stated he's having on-going back pain and claims he needs a shot for it. He doesn't know what to do moving forward and would like for Melissa or the nurse to give him a call. Please call advise pt.

## 2023-06-11 NOTE — Patient Outreach (Signed)
  Care Coordination   Follow Up Visit Note   06/11/2023 Name: Robert Bates MRN: [id number] DOB: [birth year]  Robert Bates is a 82 y.o. year old male who sees Robert Craze, NP for primary care. I spoke with  Robert Bates by phone today.  What matters to the patients health and wellness today?  Robert Bates denies any problems with CHF at this time. He reports primary concern is low back pain. He reports he has seen neurosurgeon and is awaiting approval for injection. He also reports someone called him to schedule an appointment, but is unclear who called him. Robert Bates contact primary provider office today and is awaiting a return call regarding follow up of his back pain.   Goals Addressed             This Visit's Progress    contnue to improve post hospitalization       Interventions Today    Flowsheet Row Most Recent Value  Chronic Disease   Chronic disease during today's visit Other, Congestive Heart Failure (CHF)  [chronic back pain]  General Interventions   General Interventions Discussed/Reviewed General Interventions Reviewed, Doctor Visits  Doctor Visits Discussed/Reviewed Doctor Visits Reviewed  [provided name/contact number of providers that PCP has referred patient to. per review of chart: Robert Bates [phone] and [facility] [phone]  Education Interventions   Education Provided Provided Education  Provided Verbal Education On --  [advised to continue to take medications as prescribed, encouraged to continue to communicate with provider as needed.]            SDOH assessments and interventions completed:  No  Care Coordination Interventions:  Yes, provided   Follow up plan: Follow up call scheduled for 06/30/23    Encounter Outcome:  Pt. Visit Completed   Robert Sheriff, RN, MSN, BSN, CCM [facility] Care Coordinator [phone]

## 2023-06-11 NOTE — Telephone Encounter (Signed)
Spoke to patient and he reports had a missed call from pain clinic, was advised to call the pain specialist back for appointment. And notified forms faxed to neuro surgery for clearance.

## 2023-06-12 ENCOUNTER — Other Ambulatory Visit: Payer: Self-pay | Admitting: Cardiovascular Disease

## 2023-06-12 ENCOUNTER — Other Ambulatory Visit (HOSPITAL_BASED_OUTPATIENT_CLINIC_OR_DEPARTMENT_OTHER): Payer: Self-pay

## 2023-06-12 DIAGNOSIS — I480 Paroxysmal atrial fibrillation: Secondary | ICD-10-CM

## 2023-06-12 MED ORDER — RIVAROXABAN 20 MG PO TABS
20.0000 mg | ORAL_TABLET | Freq: Every evening | ORAL | 1 refills | Status: DC
Start: 2023-06-12 — End: 2023-08-09
  Filled 2023-06-12: qty 14, 14d supply, fill #0
  Filled 2023-06-23: qty 30, 30d supply, fill #1
  Filled 2023-07-14: qty 21, 21d supply, fill #1

## 2023-06-12 NOTE — Telephone Encounter (Signed)
Xarelto 20mg  refill request received. Pt is 82 years old, weight-79.4kg, Crea-0.86 on 05/09/23, last seen by Tereso Newcomer on 03/26/23, Diagnosis-Afib, CrCl-75.66 mL/min; Dose is appropriate based on dosing criteria. Will send in refill to requested pharmacy.

## 2023-06-13 ENCOUNTER — Other Ambulatory Visit (HOSPITAL_BASED_OUTPATIENT_CLINIC_OR_DEPARTMENT_OTHER): Payer: Self-pay

## 2023-06-13 ENCOUNTER — Ambulatory Visit: Payer: Medicare Other | Admitting: Family

## 2023-06-13 ENCOUNTER — Telehealth: Payer: Self-pay

## 2023-06-13 DIAGNOSIS — M545 Low back pain, unspecified: Secondary | ICD-10-CM | POA: Diagnosis not present

## 2023-06-13 DIAGNOSIS — M533 Sacrococcygeal disorders, not elsewhere classified: Secondary | ICD-10-CM | POA: Diagnosis not present

## 2023-06-13 DIAGNOSIS — G8929 Other chronic pain: Secondary | ICD-10-CM | POA: Diagnosis not present

## 2023-06-13 NOTE — Patient Outreach (Signed)
  Care Coordination   06/13/2023 Name: Robert Bates MRN: [id number] DOB: May 31, 1941   Care Coordination Outreach Attempts:  An unsuccessful telephone outreach was attempted for a scheduled appointment today.  Follow Up Plan:  Additional outreach attempts will be made to offer the patient care coordination information and services.   Encounter Outcome:  No Answer   Care Coordination Interventions:  No, not indicated    Bevelyn Ngo, BSW, CDP Social Worker, Certified Dementia Practitioner [facility] Care Management  Care Coordination [phone]

## 2023-06-16 ENCOUNTER — Other Ambulatory Visit (HOSPITAL_BASED_OUTPATIENT_CLINIC_OR_DEPARTMENT_OTHER): Payer: Self-pay

## 2023-06-16 ENCOUNTER — Inpatient Hospital Stay: Payer: Medicare Other | Attending: Internal Medicine

## 2023-06-16 DIAGNOSIS — Z85118 Personal history of other malignant neoplasm of bronchus and lung: Secondary | ICD-10-CM | POA: Insufficient documentation

## 2023-06-16 DIAGNOSIS — Z08 Encounter for follow-up examination after completed treatment for malignant neoplasm: Secondary | ICD-10-CM | POA: Insufficient documentation

## 2023-06-17 ENCOUNTER — Ambulatory Visit: Payer: Medicare Other | Admitting: Neurology

## 2023-06-17 ENCOUNTER — Ambulatory Visit (INDEPENDENT_AMBULATORY_CARE_PROVIDER_SITE_OTHER): Payer: Medicare Other | Admitting: Family

## 2023-06-17 ENCOUNTER — Telehealth: Payer: Self-pay

## 2023-06-17 ENCOUNTER — Other Ambulatory Visit (HOSPITAL_BASED_OUTPATIENT_CLINIC_OR_DEPARTMENT_OTHER): Payer: Self-pay

## 2023-06-17 VITALS — BP 124/64 | HR 72 | Temp 97.7°F | Resp 16 | Wt 178.0 lb

## 2023-06-17 DIAGNOSIS — I5032 Chronic diastolic (congestive) heart failure: Secondary | ICD-10-CM | POA: Diagnosis not present

## 2023-06-17 DIAGNOSIS — M545 Low back pain, unspecified: Secondary | ICD-10-CM

## 2023-06-17 DIAGNOSIS — J449 Chronic obstructive pulmonary disease, unspecified: Secondary | ICD-10-CM | POA: Diagnosis not present

## 2023-06-17 DIAGNOSIS — K219 Gastro-esophageal reflux disease without esophagitis: Secondary | ICD-10-CM | POA: Diagnosis not present

## 2023-06-17 MED ORDER — PREGABALIN 25 MG PO CAPS
25.0000 mg | ORAL_CAPSULE | Freq: Two times a day (BID) | ORAL | 0 refills | Status: DC
  Filled 2023-06-17: qty 4, 2d supply, fill #0
  Filled 2023-06-17: qty 56, 28d supply, fill #0

## 2023-06-17 NOTE — Patient Instructions (Signed)
Please call Health Information Management at [facility] to inquire about getting the MRI CD- [phone]  Stop gabapentin, start lyrica 25mg  bid.

## 2023-06-17 NOTE — Assessment & Plan Note (Signed)
Uncontrolled.  While we wait for his spinal injections, will initiate lyrica 25mg  bid. D/c gabapentin. Reviewed [place] Controlled substance registry and pt signed controlled substance contract.  Will obtain UDS.

## 2023-06-17 NOTE — Patient Outreach (Signed)
  Care Coordination   06/17/2023 Name: Robert Bates MRN: [id number] DOB: 1941-10-23   Care Coordination Outreach Attempts:  A second unsuccessful outreach was attempted today to offer the patient with information about available care coordination services.  Follow Up Plan:  Additional outreach attempts will be made to offer the patient care coordination information and services.   Encounter Outcome:  No Answer   Care Coordination Interventions:  No, not indicated    Bevelyn Ngo, BSW, CDP Social Worker, Certified Dementia Practitioner [facility] Care Management  Care Coordination [phone]

## 2023-06-17 NOTE — Progress Notes (Signed)
Subjective:     Patient ID: Robert Bates, male    DOB: 1941/10/26, 82 y.o.   MRN: [id number]  Chief Complaint  Patient presents with   Back Pain    Here for follow up    Back Pain Pertinent negatives include no chest pain or headaches.    Discussed the use of AI scribe software for clinical note transcription with the patient, who gave verbal consent to proceed.   82 year old male presents to the clinic today for follow up for back pain. Patient states that his pain radiates to bilateral posterior legs. He states the Gabapentin 300mg  three times a day is not helping. Tramadol was not helpful. He is scheduled to get spinal injections is on September 13th but doesn't feel he can wait that long. Patient states that the pain is worse in the morning when he first wakes up, and the pain is ok when he is up walking around.  MRI of the lumbar spine showed severe multifactorial spinal stenosis that could cause neural compression as well as several bulging discs. He has seen neurosurgery who recommended surgery versus ESI's.  Pt preferred ESI's.      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   COPD (chronic obstructive pulmonary disease) (HCC)    Coronary artery disease    Diabetes type 2, controlled (HCC) 07/31/2017   Dyspnea    Dysrhythmia    a fib   GERD (gastroesophageal reflux disease)    Hematuria    refuses work up or referral - understands risks of  morbidity / mortality - 11/2008, 12/2008   Heme positive stool    History of hiatal hernia    History of kidney stones    Hyperlipemia    Meningioma (HCC) 10/25/2013   Follows with Dr. Coletta Memos.    Peripheral vascular disease (HCC)    Abdominal Aortic Aneursym   Pneumonia    as a child   Radiation 09/18/15-10/25/15   left lower lobe 70.2 Gy   Seizures (HCC) 02/18/2020   Tobacco abuse     Past Surgical History:  Procedure Laterality Date   CHOLECYSTECTOMY N/A 07/23/2017   Procedure: LAPAROSCOPIC CHOLECYSTECTOMY;  Surgeon: Rodman Pickle, MD;  Location: [facility];  Service: General;  Laterality: N/A;   CLIPPING OF ATRIAL APPENDAGE Left 05/08/2021   Procedure: CLIPPING OF ATRIAL APPENDAGE USING 45 ATRICLIP;  Surgeon: Linden Dolin, MD;  Location: [facility];  Service: Open Heart Surgery;  Laterality: Left;   COLONOSCOPY     CORONARY ARTERY BYPASS GRAFT N/A 05/08/2021   Procedure: CORONARY ARTERY BYPASS GRAFTING (CABG)X 3 USING LEFT INTERNAL MAMMARY ARTERY AND RIGHT GREATER SAPEHNOUS VEIN;  Surgeon: Linden Dolin, MD;  Location: [facility];  Service: Open Heart Surgery;  Laterality: N/A;   ENDOVEIN HARVEST OF GREATER SAPHENOUS VEIN Right 05/08/2021   Procedure: ENDOVEIN HARVEST OF GREATER SAPHENOUS VEIN;  Surgeon: Vickey Sages,  Merri Brunette, MD;  Location: [facility];  Service: Open Heart Surgery;  Laterality: Right;   EYE SURGERY Bilateral    Cataracts removed w/ lens implant   HERNIA REPAIR     Left 36 years ago . Right inguinal hernia repair 10-01-17 Dr. Sheliah Hatch   INGUINAL HERNIA REPAIR Right 10/01/2017   Procedure: RIGHT INGUINAL HERNIA REPAIR WITH MESH;  Surgeon: Kinsinger, De Blanch, MD;  Location: [facility];  Service: General;  Laterality: Right;  TAP BLOCK   INSERTION OF MESH Right 10/01/2017   Procedure: INSERTION OF MESH;  Surgeon: Sheliah Hatch De Blanch, MD;  Location: [facility];  Service: General;  Laterality: Right;   IR THORACENTESIS ASP PLEURAL SPACE W/IMG GUIDE  05/18/2021   IR  THORACENTESIS ASP PLEURAL SPACE W/IMG GUIDE  06/07/2021   LEFT HEART CATH AND CORONARY ANGIOGRAPHY N/A 04/20/2021   Procedure: LEFT HEART CATH AND CORONARY ANGIOGRAPHY;  Surgeon: Iran Ouch, MD;  Location: [facility];  Service: Cardiovascular;  Laterality: N/A;   TEE WITHOUT CARDIOVERSION N/A 05/08/2021   Procedure: TRANSESOPHAGEAL ECHOCARDIOGRAM (TEE);  Surgeon: Linden Dolin, MD;  Location[facility];  Service: Open Heart Surgery;  Laterality: N/A;   TONSILLECTOMY     TONSILLECTOMY     VIDEO BRONCHOSCOPY Bilateral 07/26/2015   Procedure: VIDEO BRONCHOSCOPY WITH FLUORO;  Surgeon: Nyoka Cowden, MD;  Location: [facility];  Service: Cardiopulmonary;  Laterality: Bilateral;   VIDEO BRONCHOSCOPY WITH ENDOBRONCHIAL NAVIGATION N/A 08/23/2015   Procedure: VIDEO BRONCHOSCOPY WITH ENDOBRONCHIAL NAVIGATION;  Surgeon: Delight Ovens, MD;  Location: [facility];  Service: Thoracic;  Laterality: N/A;   VIDEO BRONCHOSCOPY WITH ENDOBRONCHIAL ULTRASOUND N/A 08/23/2015   Procedure: VIDEO BRONCHOSCOPY WITH ENDOBRONCHIAL ULTRASOUND;  Surgeon: Delight Ovens, MD;  Location: [facility];  Service: Thoracic;  Laterality: N/A;    Family History  Problem Relation Age of Onset   Leukemia Father    Emphysema Father    Learning disabilities Son    Atrial fibrillation Son    Leukemia Other    Stroke Other     Social History   Socioeconomic History   Marital status: Widowed    Spouse name: Not on file   Number of children: 2   Years of education: Not on file   Highest education level: Not on file  Occupational History   Occupation: Retired    Associate Professor: [employer]    Comment: truck Air traffic controller: [employer]  Tobacco Use   Smoking status: Former    Current packs/day: 0.00    Average packs/day: 1 pack/day for 57.0 years (57.0 ttl pk-yrs)    Types: Cigarettes, Cigars    Start date: 08/07/1958    Quit date: 08/08/2015    Years since quitting: 7.8   Smokeless tobacco: Former    Types: Chew     Quit date: 11/04/1958   Tobacco comments:    Will smoke cigar every once in awhile.  Vaping daily started a couple weeks ago.  04/18/22 hfb  Vaping Use   Vaping status: Some Days  Substance and Sexual Activity   Alcohol use: Not Currently    Alcohol/week: 0.0 standard drinks of alcohol   Drug use: No   Sexual activity: Not Currently  Other Topics Concern   Not on file  Social History Narrative   Not on file   Social Determinants of Health   Financial Resource Strain: Low Risk  (05/22/2023)   Overall Financial Resource Strain (CARDIA)    Difficulty of Paying Living  Expenses: Not very hard  Food Insecurity: No Food Insecurity (05/19/2023)   Hunger Vital Sign    Worried About Running Out of Food in the Last Year: Never true    Ran Out of Food in the Last Year: Never true  Transportation Needs: No Transportation Needs (05/19/2023)   PRAPARE - Administrator, Civil Service (Medical): No    Lack of Transportation (Non-Medical): No  Physical Activity: Insufficiently Active (08/05/2021)   Exercise Vital Sign    Days of Exercise per Week: 2 days    Minutes of Exercise per Session: 40 min  Stress: Stress Concern Present (05/22/2023)   [employer] of Occupational Health - Occupational Stress Questionnaire    Feeling of Stress : Rather much  Social Connections: Moderately Isolated (05/13/2022)   Social Connection and Isolation Panel [NHANES]    Frequency of Communication with Friends and Family: Twice a week    Frequency of Social Gatherings with Friends and Family: Once a week    Attends Religious Services: Never    Database administrator or Organizations: No    Attends Banker Meetings: Never    Marital Status: Married  Catering manager Violence: Not At Risk (04/22/2023)   Humiliation, Afraid, Rape, and Kick questionnaire    Fear of Current or Ex-Partner: No    Emotionally Abused: No    Physically Abused: No    Sexually Abused: No    Outpatient  Medications Prior to Visit  Medication Sig Dispense Refill   acetaminophen (TYLENOL) 500 MG tablet Take 1-2 tablets (500-1,000 mg total) by mouth every 6 (six) hours as needed. (Patient taking differently: Take 1,000 mg by mouth daily as needed for headache, fever or moderate pain.) 30 tablet 0   albuterol (PROVENTIL) (2.5 MG/3ML) 0.083% nebulizer solution Take 3 mLs (2.5 mg total) by nebulization every 6 (six) hours as needed for wheezing or shortness of breath. 75 mL 12   amiodarone (PACERONE) 200 MG tablet Take 1/2 tablet (100mg ) by mouth three days per week (M,W,F) (Patient taking differently: Take 100 mg by mouth every Monday, Wednesday, and Friday.) 6 tablet 3   ASCORBIC ACID PO Take 1 tablet by mouth daily. Vitamin C, unknown strength.     atorvastatin (LIPITOR) 80 MG tablet Take 1 tablet (80 mg total) by mouth daily. 90 tablet 2   Budeson-Glycopyrrol-Formoterol (BREZTRI AEROSPHERE) 160-9-4.8 MCG/ACT AERO Inhale 2 puffs into the lungs in the morning and at bedtime. (Patient taking differently: Inhale 2 puffs into the lungs 2 (two) times daily as needed (wheezing, shortness of breath).) 10.7 g 5   clotrimazole-betamethasone (LOTRISONE) cream Apply 1 Application topically 2 (two) times daily. 45 g 2   fluticasone (FLONASE) 50 MCG/ACT nasal spray Place 2 sprays into both nostrils daily. 16 g 1   furosemide (LASIX) 40 MG tablet Take 1 tablet (40 mg total) by mouth daily. 90 tablet 0   glipiZIDE (GLUCOTROL XL) 2.5 MG 24 hr tablet Take 1 tablet (2.5 mg total) by mouth daily with breakfast. 90 tablet 0   omeprazole (PRILOSEC) 40 MG capsule Take 1 capsule (40 mg total) by mouth daily. 90 capsule 0   potassium chloride (KLOR-CON) 10 MEQ tablet Take 1 tablet (10 mEq total) by mouth daily. 90 tablet 3   rivaroxaban (XARELTO) 20 MG TABS tablet Take 1 tablet (20 mg total) by mouth every evening with supper. 90 tablet 1   spironolactone (ALDACTONE) 25 MG tablet Take 1 tablet (25 mg total) by mouth daily. 90  tablet 1   tamsulosin (FLOMAX) 0.4 MG CAPS capsule Take 1 capsule (0.4 mg total) by mouth daily. 90 capsule 1   gabapentin (NEURONTIN) 300 MG capsule Take 1 capsule (300 mg total) by mouth 3 (three) times daily. (Patient taking differently: Take 300 mg by mouth 2 (two) times daily.) 90 capsule 5   methylPREDNISolone (MEDROL DOSEPAK) 4 MG TBPK tablet Take 6 tablets (60 mg total) by mouth on day 1, then 5 tablets (50 mg total) on day 2, then 4 tablets (40 mg total) on day 3, then 3 tablets (30 mg total) on day 4, then 2 tablets (20 mg total) on day 5, and then 1 tablet (10 mg total) on day 6. 21 tablet 0   No facility-administered medications prior to visit.    Allergies  Allergen Reactions   Iodine Swelling    Neck, gland swelling   Iohexol Swelling    Neck, gland swelling   Metformin And Related Diarrhea    Review of Systems  Constitutional:  Negative for malaise/fatigue.  HENT:  Negative for hearing loss and sore throat.   Respiratory:  Positive for cough and wheezing (noted in left lower lung).   Cardiovascular:  Negative for chest pain and claudication.  Gastrointestinal:  Negative for constipation, diarrhea and vomiting.  Musculoskeletal:  Positive for back pain.  Neurological:  Negative for dizziness, focal weakness and headaches.  Psychiatric/Behavioral:  Negative for depression and suicidal ideas.        Objective:    Physical Exam Constitutional:      Appearance: Normal appearance.  HENT:     Head: Normocephalic.     Nose: Nose normal.  Cardiovascular:     Rate and Rhythm: Normal rate and regular rhythm.  Pulmonary:     Breath sounds: Wheezing (left lower lung) present.  Neurological:     General: No focal deficit present.     Mental Status: He is alert and oriented to person, place, and time. Mental status is at baseline.  Psychiatric:        Mood and Affect: Mood normal.        Behavior: Behavior normal.        Thought Content: Thought content normal.         Judgment: Judgment normal.      BP 124/64 (BP Location: Right Arm, Patient Position: Sitting, Cuff Size: Small)   Pulse 72   Temp 97.7 F (36.5 C) (Oral)   Resp 16   Wt 178 lb (80.7 kg)   SpO2 98%   BMI 27.88 kg/m  Wt Readings from Last 3 Encounters:  06/17/23 178 lb (80.7 kg)  05/16/23 175 lb (79.4 kg)  05/09/23 179 lb 3.2 oz (81.3 kg)       Assessment & Plan:   Problem List Items Addressed This Visit       Unprioritized   Chronic heart failure with preserved ejection fraction (HCC) (Chronic)    Wt Readings from Last 3 Encounters:  06/17/23 178 lb (80.7 kg)  05/16/23 175 lb (79.4 kg)  05/09/23 179 lb 3.2 oz (81.3 kg)   Weight is stable on aldactone and furosemide. Continue same.       Low back pain - Primary    Uncontrolled.  While we wait for his spinal injections, will initiate lyrica 25mg  bid. D/c gabapentin. Reviewed [place] Controlled substance registry and pt signed controlled substance contract.  Will obtain UDS.       Relevant Orders   DRUG MONITORING, PANEL 8  WITH CONFIRMATION, URINE   GERD    Stable. Continue with current medications      COPD GOLD II     Managed by pulmonary. But continue with Breztri daily and Albuterol as needed        I have discontinued Laderius Demond's gabapentin and methylPREDNISolone. I am also having him start on pregabalin. Additionally, I am having him maintain his acetaminophen, Breztri Aerosphere, albuterol, fluticasone, amiodarone, atorvastatin, clotrimazole-betamethasone, glipiZIDE, ASCORBIC ACID PO, potassium chloride, omeprazole, spironolactone, tamsulosin, furosemide, and rivaroxaban.  Meds ordered this encounter  Medications   pregabalin (LYRICA) 25 MG capsule    Sig: Take 1 capsule (25 mg total) by mouth 2 (two) times daily.    Dispense:  60 capsule    Refill:  0    Order Specific Question:   Supervising Provider    Answer:   Danise Edge A [[id number]]

## 2023-06-17 NOTE — Assessment & Plan Note (Signed)
Stable  Continue with current medications

## 2023-06-17 NOTE — Assessment & Plan Note (Signed)
Managed by pulmonary. But continue with Breztri daily and Albuterol as needed

## 2023-06-17 NOTE — Assessment & Plan Note (Signed)
Wt Readings from Last 3 Encounters:  06/17/23 178 lb (80.7 kg)  05/16/23 175 lb (79.4 kg)  05/09/23 179 lb 3.2 oz (81.3 kg)   Weight is stable on aldactone and furosemide. Continue same.

## 2023-06-18 ENCOUNTER — Other Ambulatory Visit: Payer: Self-pay

## 2023-06-18 ENCOUNTER — Telehealth: Payer: Self-pay | Admitting: Family

## 2023-06-18 ENCOUNTER — Inpatient Hospital Stay (HOSPITAL_BASED_OUTPATIENT_CLINIC_OR_DEPARTMENT_OTHER): Payer: Medicare Other | Admitting: Internal Medicine

## 2023-06-18 ENCOUNTER — Inpatient Hospital Stay: Payer: Medicare Other

## 2023-06-18 VITALS — BP 114/72 | HR 75 | Temp 97.5°F | Resp 15 | Ht 67.0 in | Wt 180.5 lb

## 2023-06-18 DIAGNOSIS — C3432 Malignant neoplasm of lower lobe, left bronchus or lung: Secondary | ICD-10-CM

## 2023-06-18 DIAGNOSIS — Z08 Encounter for follow-up examination after completed treatment for malignant neoplasm: Secondary | ICD-10-CM | POA: Diagnosis not present

## 2023-06-18 DIAGNOSIS — Z85118 Personal history of other malignant neoplasm of bronchus and lung: Secondary | ICD-10-CM | POA: Diagnosis not present

## 2023-06-18 LAB — CMP (CANCER CENTER ONLY)
ALT: 10 U/L (ref 0–44)
AST: 10 U/L — ABNORMAL LOW (ref 15–41)
Albumin: 3.7 g/dL (ref 3.5–5.0)
Alkaline Phosphatase: 98 U/L (ref 38–126)
Anion gap: 6 (ref 5–15)
BUN: 22 mg/dL (ref 8–23)
CO2: 32 mmol/L (ref 22–32)
Calcium: 8.8 mg/dL — ABNORMAL LOW (ref 8.9–10.3)
Chloride: 104 mmol/L (ref 98–111)
Creatinine: 1 mg/dL (ref 0.61–1.24)
GFR, Estimated: >60 mL/min (ref >60)
Glucose, Bld: 160 mg/dL — ABNORMAL HIGH (ref 70–99)
Potassium: 3.9 mmol/L (ref 3.5–5.1)
Sodium: 142 mmol/L (ref 135–145)
Total Bilirubin: 0.3 mg/dL (ref 0.3–1.2)
Total Protein: 6.2 g/dL — ABNORMAL LOW (ref 6.5–8.1)

## 2023-06-18 LAB — CBC WITH DIFFERENTIAL (CANCER CENTER ONLY)
Abs Immature Granulocytes: 0.04 10*3/uL (ref 0.00–0.07)
Basophils Absolute: 0 10*3/uL (ref 0.0–0.1)
Basophils Relative: 1 %
Eosinophils Absolute: 0.1 10*3/uL (ref 0.0–0.5)
Eosinophils Relative: 2 %
HCT: 36.5 % — ABNORMAL LOW (ref 39.0–52.0)
Hemoglobin: 11.2 g/dL — ABNORMAL LOW (ref 13.0–17.0)
Immature Granulocytes: 1 %
Lymphocytes Relative: 14 %
Lymphs Abs: 1.1 10*3/uL (ref 0.7–4.0)
MCH: 26.8 pg (ref 26.0–34.0)
MCHC: 30.7 g/dL (ref 30.0–36.0)
MCV: 87.3 fL (ref 80.0–100.0)
Monocytes Absolute: 0.8 10*3/uL (ref 0.1–1.0)
Monocytes Relative: 10 %
Neutro Abs: 5.8 10*3/uL (ref 1.7–7.7)
Neutrophils Relative %: 72 %
Platelet Count: 268 10*3/uL (ref 150–400)
RBC: 4.18 MIL/uL — ABNORMAL LOW (ref 4.22–5.81)
RDW: 17.7 % — ABNORMAL HIGH (ref 11.5–15.5)
WBC Count: 7.9 10*3/uL (ref 4.0–10.5)
nRBC: 0 % (ref 0.0–0.2)

## 2023-06-18 NOTE — Telephone Encounter (Signed)
Spoke to pt's niece Steward Drone at pt request.  Update given.

## 2023-06-18 NOTE — Progress Notes (Signed)
[facility] Telephone:([phone]) [phone]   Fax:([phone]  OFFICE PROGRESS NOTE  Sandford Craze, NP [address] [place] [address]  DIAGNOSIS: Stage IIB (T3, N0, M0) non-small cell lung cancer, squamous cell carcinoma presented with left lower lobe endobronchial lesion as well as suspicious groundglass opacity in the left upper lobe diagnosed in September 2016.  PRIOR THERAPY: Curative radiotherapy to the left upper lobe lung mass under the care of Dr. Roselind Messier completed 10/25/2015.  CURRENT THERAPY: Observation  INTERVAL HISTORY: Nycere Reny 82 y.o. male returns to the clinic today for follow-up visit accompanied by his niece.  The patient is feeling fine today with no concerning complaints except for the mild fatigue and baseline shortness of breath.  He denied having any chest pain, cough or hemoptysis.  He has no nausea, vomiting, diarrhea or constipation.  He has no headache or visual changes.  He has no recent weight loss or night sweats.  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   COPD (chronic obstructive pulmonary disease) (HCC)    Coronary artery disease    Diabetes type 2, controlled (HCC) 07/31/2017   Dyspnea    Dysrhythmia    a fib   GERD (gastroesophageal reflux disease)    Hematuria    refuses work up or referral - understands risks of morbidity / mortality - 11/2008, 12/2008   Heme positive stool    History of hiatal hernia    History of kidney stones    Hyperlipemia    Meningioma (HCC) 10/25/2013   Follows with Dr. Coletta Memos.    Peripheral vascular disease (HCC)    Abdominal Aortic Aneursym   Pneumonia    as a child   Radiation  09/18/15-10/25/15   left lower lobe 70.2 Gy   Seizures (HCC) 02/18/2020   Tobacco abuse     ALLERGIES:  is allergic to iodine, iohexol, and metformin and related.  MEDICATIONS:  Current Outpatient Medications  Medication Sig Dispense Refill   acetaminophen (TYLENOL) 500 MG tablet Take 1-2 tablets (500-1,000 mg total) by mouth every 6 (six) hours as needed. (Patient taking differently: Take 1,000 mg by mouth daily as needed for headache, fever or moderate pain.) 30 tablet 0   albuterol (PROVENTIL) (2.5 MG/3ML) 0.083% nebulizer solution Take 3 mLs (2.5 mg total) by nebulization every 6 (six) hours as needed for wheezing or shortness of breath. 75 mL 12   amiodarone (PACERONE) 200 MG tablet Take 1/2 tablet (100mg ) by mouth three days per week (M,W,F) (Patient taking differently: Take 100 mg by mouth every Monday, Wednesday, and Friday.) 6 tablet 3   ASCORBIC ACID PO Take 1 tablet by mouth daily. Vitamin C, unknown strength.     atorvastatin (LIPITOR) 80 MG tablet Take 1 tablet (80 mg total) by mouth daily. 90 tablet 2   Budeson-Glycopyrrol-Formoterol (BREZTRI AEROSPHERE) 160-9-4.8 MCG/ACT AERO Inhale 2 puffs into the lungs in the morning and at bedtime. (Patient taking differently: Inhale 2 puffs into the lungs 2 (two) times daily as needed (wheezing, shortness of breath).) 10.7 g 5   clotrimazole-betamethasone (LOTRISONE) cream Apply 1 Application topically 2 (two) times daily. 45 g 2  fluticasone (FLONASE) 50 MCG/ACT nasal spray Place 2 sprays into both nostrils daily. 16 g 1   furosemide (LASIX) 40 MG tablet Take 1 tablet (40 mg total) by mouth daily. 90 tablet 0   glipiZIDE (GLUCOTROL XL) 2.5 MG 24 hr tablet Take 1 tablet (2.5 mg total) by mouth daily with breakfast. 90 tablet 0   omeprazole (PRILOSEC) 40 MG capsule Take 1 capsule (40 mg total) by mouth daily. 90 capsule 0   potassium chloride (KLOR-CON) 10 MEQ tablet Take 1 tablet (10 mEq total) by mouth daily. 90 tablet 3   pregabalin  (LYRICA) 25 MG capsule Take 1 capsule (25 mg total) by mouth 2 (two) times daily. 60 capsule 0   rivaroxaban (XARELTO) 20 MG TABS tablet Take 1 tablet (20 mg total) by mouth every evening with supper. 90 tablet 1   spironolactone (ALDACTONE) 25 MG tablet Take 1 tablet (25 mg total) by mouth daily. 90 tablet 1   tamsulosin (FLOMAX) 0.4 MG CAPS capsule Take 1 capsule (0.4 mg total) by mouth daily. 90 capsule 1   No current facility-administered medications for this visit.    SURGICAL HISTORY:  Past Surgical History:  Procedure Laterality Date   CHOLECYSTECTOMY N/A 07/23/2017   Procedure: LAPAROSCOPIC CHOLECYSTECTOMY;  Surgeon: Kinsinger, De Blanch, MD;  Location: [facility];  Service: General;  Laterality: N/A;   CLIPPING OF ATRIAL APPENDAGE Left 05/08/2021   Procedure: CLIPPING OF ATRIAL APPENDAGE USING 45 ATRICLIP;  Surgeon: Linden Dolin, MD;  Location: [facility] OR;  Service: Open Heart Surgery;  Laterality: Left;   COLONOSCOPY     CORONARY ARTERY BYPASS GRAFT N/A 05/08/2021   Procedure: CORONARY ARTERY BYPASS GRAFTING (CABG)X 3 USING LEFT INTERNAL MAMMARY ARTERY AND RIGHT GREATER SAPEHNOUS VEIN;  Surgeon: Linden Dolin, MD;  Location: [facility] OR;  Service: Open Heart Surgery;  Laterality: N/A;   ENDOVEIN HARVEST OF GREATER SAPHENOUS VEIN Right 05/08/2021   Procedure: ENDOVEIN HARVEST OF GREATER SAPHENOUS VEIN;  Surgeon: Linden Dolin, MD;  Location: [facility] OR;  Service: Open Heart Surgery;  Laterality: Right;   EYE SURGERY Bilateral    Cataracts removed w/ lens implant   HERNIA REPAIR     Left 36 years ago . Right inguinal hernia repair 10-01-17 Dr. Sheliah Hatch   INGUINAL HERNIA REPAIR Right 10/01/2017   Procedure: RIGHT INGUINAL HERNIA REPAIR WITH MESH;  Surgeon: Kinsinger, De Blanch, MD;  Location: [facility];  Service: General;  Laterality: Right;  TAP BLOCK   INSERTION OF MESH Right 10/01/2017   Procedure: INSERTION OF MESH;  Surgeon: Sheliah Hatch De Blanch, MD;  Location: [facility];  Service: General;   Laterality: Right;   IR THORACENTESIS ASP PLEURAL SPACE W/IMG GUIDE  05/18/2021   IR THORACENTESIS ASP PLEURAL SPACE W/IMG GUIDE  06/07/2021   LEFT HEART CATH AND CORONARY ANGIOGRAPHY N/A 04/20/2021   Procedure: LEFT HEART CATH AND CORONARY ANGIOGRAPHY;  Surgeon: Iran Ouch, MD;  [facility]: [facility];  Service: Cardiovascular;  Laterality: N/A;   TEE WITHOUT CARDIOVERSION N/A 05/08/2021   Procedure: TRANSESOPHAGEAL ECHOCARDIOGRAM (TEE);  Surgeon: Linden Dolin, MD;  Location[facility] OR;  Service: Open Heart Surgery;  Laterality: N/A;   TONSILLECTOMY     TONSILLECTOMY     VIDEO BRONCHOSCOPY Bilateral 07/26/2015   Procedure: VIDEO BRONCHOSCOPY WITH FLUORO;  Surgeon: Nyoka Cowden, MD;  [facility]: [facility];  Service: Cardiopulmonary;  Laterality: Bilateral;   VIDEO BRONCHOSCOPY WITH ENDOBRONCHIAL NAVIGATION N/A 08/23/2015   Procedure: VIDEO BRONCHOSCOPY WITH ENDOBRONCHIAL NAVIGATION;  Surgeon:  Delight Ovens, MD;  Location: [facility] OR;  Service: Thoracic;  Laterality: N/A;   VIDEO BRONCHOSCOPY WITH ENDOBRONCHIAL ULTRASOUND N/A 08/23/2015   Procedure: VIDEO BRONCHOSCOPY WITH ENDOBRONCHIAL ULTRASOUND;  Surgeon: Delight Ovens, MD;  Location: [facility];  Service: Thoracic;  Laterality: N/A;    REVIEW OF SYSTEMS:  A comprehensive review of systems was negative except for: Constitutional: positive for fatigue Respiratory: positive for dyspnea on exertion   PHYSICAL EXAMINATION: General appearance: alert, cooperative and no distress Head: Normocephalic, without obvious abnormality, atraumatic Neck: no adenopathy, no JVD, supple, symmetrical, trachea midline and thyroid not enlarged, symmetric, no tenderness/mass/nodules Lymph nodes: Cervical, supraclavicular, and axillary nodes normal. Resp: wheezes bilaterally Back: symmetric, no curvature. ROM normal. No CVA tenderness. Cardio: regular rate and rhythm, S1, S2 normal, no murmur, click, rub or gallop GI: soft, non-tender; bowel sounds  normal; no masses,  no organomegaly Extremities: extremities normal, atraumatic, no cyanosis or edema  ECOG PERFORMANCE STATUS: 1 - Symptomatic but completely ambulatory  Blood pressure 114/72, pulse 75, temperature (!) 97.5 F (36.4 C), temperature source Oral, resp. rate 15, height 5\' 7"  (1.702 m), weight 180 lb 8 oz (81.9 kg), SpO2 97%.  LABORATORY DATA: Lab Results  Component Value Date   WBC 7.2 04/24/2023   HGB 12.1 (L) 04/24/2023   HCT 39.3 04/24/2023   MCV 85.6 04/24/2023   PLT 279 04/24/2023      Chemistry      Component Value Date/Time   NA 141 05/09/2023 1058   NA 141 04/02/2023 1123   NA 140 06/17/2017 1315   K 3.7 05/09/2023 1058   K 4.5 06/17/2017 1315   CL 102 05/09/2023 1058   CO2 30 05/09/2023 1058   CO2 28 06/17/2017 1315   BUN 18 05/09/2023 1058   BUN 16 04/02/2023 1123   BUN 18.1 06/17/2017 1315   CREATININE 0.86 05/09/2023 1058   CREATININE 0.84 01/17/2023 1552   CREATININE 0.8 06/17/2017 1315      Component Value Date/Time   CALCIUM 9.4 05/09/2023 1058   CALCIUM 9.8 06/17/2017 1315   ALKPHOS 95 03/24/2023 1135   ALKPHOS 82 06/17/2017 1315   AST 10 03/24/2023 1135   AST 12 (L) 01/08/2023 0827   AST 16 06/17/2017 1315   ALT 10 03/24/2023 1135   ALT 9 01/08/2023 0827   ALT 10 06/17/2017 1315   BILITOT 0.6 03/24/2023 1135   BILITOT 0.4 01/08/2023 0827   BILITOT 0.47 06/17/2017 1315       RADIOGRAPHIC STUDIES: MR Lumbar Spine Wo Contrast  Result Date: 05/20/2023 CLINICAL DATA:  Low back pain. Cauda equina syndrome suspected. Tingling of the lower extremities with weakness. EXAM: MRI LUMBAR SPINE WITHOUT CONTRAST TECHNIQUE: Multiplanar, multisequence MR imaging of the lumbar spine was performed. No intravenous contrast was administered. COMPARISON:  Profuse 03/15/2022 FINDINGS: Segmentation:  5 lumbar type vertebral bodies. Alignment:  No malalignment. Vertebrae: No fracture or focal bone lesion. Mild degenerative endplate edema at the superior  endplate of L5. Conus medullaris and cauda equina: Conus extends to the L1-2 level. Conus and cauda equina appear normal. Paraspinal and other soft tissues: Atherosclerosis of the aorta with ectasia. Maximal diameter measures 3.7 cm. Disc levels: Mild non-compressive disc bulges at T12-L1 and L1-2. L2-3: Mild to moderate bulging of the disc. Mild narrowing of the left lateral recess but no neural compression. L3-4: Moderate bulging of the disc. Mild facet and ligamentous hypertrophy. Mild stenosis of both lateral recesses but no definite neural compression. L4-5: Shallow protrusion of  the disc more prominent in the left posterolateral direction. Facet and ligamentous hypertrophy. Severe multifactorial spinal stenosis that could cause neural compression on either or both sides. Stenosis is slightly worse on the left. L5-S1: Mild bulging of the disc. Mild facet degeneration. No compressive stenosis. IMPRESSION: 1. L4-5: Severe multifactorial spinal stenosis that could cause neural compression on either or both sides. Stenosis is slightly worse on the left. 2. L3-4: Moderate bulging of the disc. Mild facet and ligamentous hypertrophy. Mild stenosis of both lateral recesses but no definite neural compression. 3. L2-3: Mild to moderate bulging of the disc. Mild narrowing of the left lateral recess but no neural compression. 4. 3.7 cm abdominal aortic ectasia. Recommend follow-up ultrasound every 2 years. This recommendation follows ACR consensus guidelines: White Paper of the ACR Incidental Findings Committee II on Vascular Findings. J Am Coll Radiol 2013; 10:789-794. Electronically Signed   By: Paulina Fusi M.D.   On: 05/20/2023 18:34     ASSESSMENT AND PLAN:  This is a very pleasant 82 years old white male with a stage IIB non-small cell lung cancer, squamous cell carcinoma presented with left upper lobe endobronchial lesion in addition to left upper lobe suspicious groundglass opacity diagnosed in September  2016. He is status post curative radiotherapy under the care of Dr. Roselind Messier. The patient is currently on observation and he is feeling fine with no concerning complaints. I recommended for him to have repeat blood work today but I will see him back for follow-up visit in March 2025 with repeat CT scan of the chest and blood work. He was advised to call immediately if he has any other concerning symptoms in the interval. The patient voices understanding of current disease status and treatment options and is in agreement with the current care plan. All questions were answered. The patient knows to call the clinic with any problems, questions or concerns. We can certainly see the patient much sooner if necessary.  Disclaimer: This note was dictated with voice recognition software. Similar sounding words can inadvertently be transcribed and may not be corrected upon review.

## 2023-06-19 ENCOUNTER — Encounter (INDEPENDENT_AMBULATORY_CARE_PROVIDER_SITE_OTHER): Payer: Self-pay

## 2023-06-20 ENCOUNTER — Telehealth: Payer: Self-pay

## 2023-06-20 NOTE — Patient Outreach (Signed)
  Care Coordination   06/20/2023 Name: Robert Bates MRN: [id number] DOB: 15-May-1941   Care Coordination Outreach Attempts:  A third unsuccessful outreach was attempted today to offer the patient with information about available care coordination services.  Follow Up Plan:  No further outreach attempts will be made at this time. We have been unable to contact the patient to offer or enroll patient in care coordination services  Encounter Outcome:  No Answer   Care Coordination Interventions:  No, not indicated    Bevelyn Ngo, BSW, CDP Social Worker, Certified Dementia Practitioner [facility] Care Management  Care Coordination [phone]

## 2023-06-22 ENCOUNTER — Telehealth: Payer: Self-pay | Admitting: Family

## 2023-06-22 NOTE — Telephone Encounter (Signed)
He did not complete UDS.  Let's schedule lab visit.  Also, hs signed a controlled substance  contract, but I don't see that it has been updated or is in the media tab.  If we can't find it, we can have him resign when he comes in for UDS.

## 2023-06-23 ENCOUNTER — Other Ambulatory Visit (HOSPITAL_BASED_OUTPATIENT_CLINIC_OR_DEPARTMENT_OTHER): Payer: Self-pay

## 2023-06-23 ENCOUNTER — Other Ambulatory Visit: Payer: Medicare Other

## 2023-06-23 DIAGNOSIS — M545 Low back pain, unspecified: Secondary | ICD-10-CM | POA: Diagnosis not present

## 2023-06-23 DIAGNOSIS — Z79899 Other long term (current) drug therapy: Secondary | ICD-10-CM | POA: Diagnosis not present

## 2023-06-23 NOTE — Addendum Note (Signed)
Addended by: Mervin Kung A on: 06/23/2023 02:39 PM   Modules accepted: Orders

## 2023-06-23 NOTE — Telephone Encounter (Signed)
FYI patient signed contract 06/17/23 (documented on FYI folder) not scanned yet.  Patient was scheduled to come in for UDS later today

## 2023-06-24 ENCOUNTER — Ambulatory Visit: Payer: Medicare Other | Admitting: Family

## 2023-06-24 LAB — DRUG MONITORING, PANEL 8 WITH CONFIRMATION, URINE
6 Acetylmorphine: NEGATIVE ng/mL (ref <10)
Alcohol Metabolites: NEGATIVE ng/mL (ref <500)
Amphetamines: NEGATIVE ng/mL (ref <500)
Benzodiazepines: NEGATIVE ng/mL (ref <100)
Buprenorphine, Urine: NEGATIVE ng/mL (ref <5)
Cocaine Metabolite: NEGATIVE ng/mL (ref <150)
Creatinine: 169.5 mg/dL (ref >=20.0)
MDMA: NEGATIVE ng/mL (ref <500)
Marijuana Metabolite: NEGATIVE ng/mL (ref <20)
Opiates: NEGATIVE ng/mL (ref <100)
Oxidant: NEGATIVE ug/mL (ref <200)
Oxycodone: NEGATIVE ng/mL (ref <100)
pH: 4.8 (ref 4.5–9.0)

## 2023-06-24 LAB — DM TEMPLATE

## 2023-06-25 ENCOUNTER — Ambulatory Visit (INDEPENDENT_AMBULATORY_CARE_PROVIDER_SITE_OTHER): Payer: Medicare Other | Admitting: Pharmacist

## 2023-06-25 ENCOUNTER — Ambulatory Visit: Payer: Medicare Other | Admitting: Family

## 2023-06-25 ENCOUNTER — Other Ambulatory Visit (HOSPITAL_BASED_OUTPATIENT_CLINIC_OR_DEPARTMENT_OTHER): Payer: Self-pay

## 2023-06-25 DIAGNOSIS — I480 Paroxysmal atrial fibrillation: Secondary | ICD-10-CM

## 2023-06-25 MED ORDER — RIVAROXABAN 20 MG PO TABS
20.0000 mg | ORAL_TABLET | Freq: Every evening | ORAL | Status: AC
Start: 2023-06-25 — End: ?

## 2023-06-25 NOTE — Progress Notes (Signed)
06/25/2023 Name: Robert Bates MRN: [id number] DOB: [birth year]  Chief Complaint  Patient presents with   Medication Management    Robert Bates is a 82 y.o. year old male who presented for a telephone visit.   They were referred to the pharmacist by their PCP for assistance in managing medication access.    Subjective:  Patient is in Medicare coverage gap and cost of Xarelto is high. Has prescription ready for pick up currently for #15 tablets that is > $100.  We applied earlier in 2024 for LIS but patient was denied LIS due to his income being about $240 over yearly income limit.   Patient will have to spend 4% of income on medications to qualify for [employer] [place] patient assistance. Which would be about $920. Patient has spent $823 per pharmacy. If he fills Xarelto once more for 30 tablets he will met the 4% of income requirement.    Medication Access/Adherence  Patient reports affordability concerns with their medications: Yes  Patient reports access/transportation concerns to their pharmacy: No  Patient reports adherence concerns with their medications:  No       Objective:  Lab Results  Component Value Date   HGBA1C 6.7 (H) 04/03/2023    Lab Results  Component Value Date   CREATININE 1.00 06/18/2023   BUN 22 06/18/2023   NA 142 06/18/2023   K 3.9 06/18/2023   CL 104 06/18/2023   CO2 32 06/18/2023    Lab Results  Component Value Date   CHOL 127 10/07/2022   HDL 64 10/07/2022   LDLCALC 46 10/07/2022   LDLDIRECT 142.2 12/26/2008   TRIG 93 10/07/2022   CHOLHDL 2.0 10/07/2022    Medications Reviewed Today     Reviewed by [name], RPH-CPP (Pharmacist) on 06/25/23 at 1329  Med List Status: <None>   Medication Order Taking? Sig Documenting Provider Last Dose Status Informant  acetaminophen (TYLENOL) 500 MG tablet [id number] Yes Take 1-2 tablets (500-1,000 mg total) by mouth every 6 (six) hours as needed.  Patient taking differently: Take 1,000 mg  by mouth daily as needed for headache, fever or moderate pain.   Ardelle Balls, PA-C Taking Active Self           Med Note Michaela Corner   Tue Apr 29, 2023  1:41 PM) 04/29/23: Reports during Robert Bates call has not needed recently   albuterol (PROVENTIL) (2.5 MG/3ML) 0.083% nebulizer solution [id number] Yes Take 3 mLs (2.5 mg total) by nebulization every 6 (six) hours as needed for wheezing or shortness of breath. Charlott Holler, MD Taking Active Self, Pharmacy Records           Med Note Robert Bates, [place] B   Thu Jun 06, 2022 11:35 AM)    amiodarone (PACERONE) 200 MG tablet [id number] Yes Take 1/2 tablet (100mg ) by mouth Robert days per week (M,W,F)  Patient taking differently: Take 100 mg by mouth every Monday, Wednesday, and Friday.   Nahser, Deloris Ping, MD Taking Active Self, Pharmacy Records           Med Note Robert Bates, [place] B   Wed Jun 25, 2023  1:21 PM)    ASCORBIC ACID PO [id number]  Take 1 tablet by mouth daily. Vitamin C, unknown strength. [provider]  Active Self  atorvastatin (LIPITOR) 80 MG tablet [id number] Yes Take 1 tablet (80 mg total) by mouth daily. Nahser, Deloris Ping, MD Taking Active Self, Pharmacy Records  Budeson-Glycopyrrol-Formoterol [facility] AEROSPHERE) 160-9-4.8 MCG/ACT AERO [id number]  Yes Inhale 2 puffs into the lungs in the morning and at bedtime.  Patient taking differently: Inhale 2 puffs into the lungs 2 (two) times daily as needed (wheezing, shortness of breath).   Charlott Holler, MD Taking Active Self, Pharmacy Records           Med Note Robert Bates, [place]is Brace Apr 23, 2023 12:21 PM) Has some left from patient assistance program in 2023  clotrimazole-betamethasone (LOTRISONE) cream [id number] No Apply 1 Application topically 2 (two) times daily.  Patient not taking: Reported on 06/25/2023   Milderd Meager., MD Not Taking Active Self, Pharmacy Records           Med Note Robert Bates, [place] B   Wed Jun 25, 2023  1:22 PM)    fluticasone Robert Bates) 50 MCG/ACT  nasal spray [id number] Yes Place 2 sprays into both nostrils daily. Saguier, Ramon Dredge, PA-C Taking Active Self, Pharmacy Records           Med Note Michaela Corner   Tue Apr 29, 2023  1:38 PM) 04/29/23: Reports during [facility] call has not needed recently  furosemide (LASIX) 40 MG tablet [id number] Yes Take 1 tablet (40 mg total) by mouth daily. Sandford Craze, NP Taking Active   glipiZIDE (GLUCOTROL XL) 2.5 MG 24 hr tablet [id number] Yes Take 1 tablet (2.5 mg total) by mouth daily with breakfast. Bradd Canary, MD Taking Active Self, Pharmacy Records  omeprazole (PRILOSEC) 40 MG capsule [id number] Yes Take 1 capsule (40 mg total) by mouth daily. Sandford Craze, NP Taking Active   potassium chloride (KLOR-CON) 10 MEQ tablet [id number] Yes Take 1 tablet (10 mEq total) by mouth daily. Tereso Newcomer T, PA-C Taking Active   pregabalin (LYRICA) 25 MG capsule [id number] Yes Take 1 capsule (25 mg total) by mouth 2 (two) times daily. Sandford Craze, NP Taking Active   rivaroxaban (XARELTO) 20 MG TABS tablet [id number] Yes Take 1 tablet (20 mg total) by mouth every evening with supper. Nahser, Deloris Ping, MD Taking Active   spironolactone (ALDACTONE) 25 MG tablet [id number] Yes Take 1 tablet (25 mg total) by mouth daily. Sandford Craze, NP Taking Active   tamsulosin Robert Bates) 0.4 MG CAPS capsule [id number] Yes Take 1 capsule (0.4 mg total) by mouth daily. Sandford Craze, NP Taking Active               Assessment/Plan:   Patient needs to spend about $100 more out of pocket on medications this year to apply for medication assistance program for Xarelto (need to spend $920 and he is currently at [phone])  Another option which patient used in 2023 is the [employer] (now called Xarelto for Me Coverage gap program). Cost to patient would be $89/30 days or $260 for 90 days.   Provided samples #14 of Xarelto 20mg  until we can complete paperwork for [employer] medication assistance  program or get patient signed up for xarelto for Me program.  Patient is considering which he would preferred to use.   Reviewed medication list and refill history with patient.    [name], PharmD Clinical Pharmacist [facility]

## 2023-06-26 ENCOUNTER — Ambulatory Visit: Payer: Self-pay

## 2023-06-26 DIAGNOSIS — M48062 Spinal stenosis, lumbar region with neurogenic claudication: Secondary | ICD-10-CM | POA: Diagnosis not present

## 2023-06-26 NOTE — Patient Instructions (Signed)
Visit Information  Thank you for taking time to visit with me today. Please don't hesitate to contact me if I can be of assistance to you.    If you are experiencing a Mental Health or Behavioral Health Crisis or need someone to talk to, please call 1-800-273-TALK (toll free, 24 hour hotline) go to [facility] [address], [place] [phone]) call 911  Patient verbalizes understanding of instructions and care plan provided today and agrees to view in MyChart. Active MyChart status and patient understanding of how to access instructions and care plan via MyChart confirmed with patient.     No further follow up required: Please contact me as needed.  Bevelyn Ngo, BSW, CDP Social Worker, Certified Dementia Practitioner [facility] Care Management  Care Coordination [phone]

## 2023-06-26 NOTE — Patient Outreach (Signed)
  Care Coordination   Follow Up Visit Note   06/26/2023 Name: Turney Blount MRN: [id number] DOB: [birth year]  Esmond Widhalm is a 82 y.o. year old male who sees Sandford Craze, NP for primary care. I spoke with  [name] by phone today.  What matters to the patients health and wellness today?  Patient reports he is working on a housing plan with his son.    SDOH assessments and interventions completed:  No     Care Coordination Interventions:  Yes, provided   Interventions Today    Flowsheet Row Most Recent Value  Chronic Disease   Chronic disease during today's visit Other, Congestive Heart Failure (CHF)  [Chronic Back Pain]  General Interventions   General Interventions Discussed/Reviewed General Interventions Reviewed  [Pt reports he has spoken with his son about his son placing trailer in the pt name. Pt will lease a room to his son. No longer planning to move at this time. Pt will contact SW as needed]        Follow up plan: No further intervention required.   Encounter Outcome:  Pt. Visit Completed   Bevelyn Ngo, BSW, CDP Social Worker, Certified Dementia Practitioner [facility] Care Management  Care Coordination [phone]

## 2023-06-30 ENCOUNTER — Telehealth: Payer: Self-pay

## 2023-06-30 ENCOUNTER — Telehealth: Payer: Self-pay | Admitting: Pharmacist

## 2023-06-30 NOTE — Patient Outreach (Signed)
  Care Coordination   06/30/2023 Name: Laker Josephsen MRN: [id number] DOB: 1941/05/28   Care Coordination Outreach Attempts:  An unsuccessful telephone outreach was attempted for a scheduled appointment today.  Follow Up Plan:  Additional outreach attempts will be made to offer the patient care coordination information and services.   Encounter Outcome:  No Answer   Care Coordination Interventions:  No, not indicated    Kathyrn Sheriff, RN, MSN, BSN, CCM Care Management Coordinator [phone]

## 2023-06-30 NOTE — Telephone Encounter (Signed)
Left message for patient to call me regarding either applying for medication assistance program for Xarelto or enrolling in the [place] and Me program to get Xarelto for $89 per month.

## 2023-07-03 ENCOUNTER — Other Ambulatory Visit (HOSPITAL_BASED_OUTPATIENT_CLINIC_OR_DEPARTMENT_OTHER): Payer: Self-pay

## 2023-07-10 ENCOUNTER — Telehealth: Payer: Self-pay | Admitting: *Deleted

## 2023-07-10 NOTE — Progress Notes (Signed)
  Care Coordination Note  07/10/2023 Name: Robert Bates MRN: [id number] DOB: 05/26/1941  [name] is a 82 y.o. year old male who is a primary care patient of Sandford Craze, NP and is actively engaged with the care management team. I reached out to [name] by phone today to assist with re-scheduling a follow up visit with the RN Case Manager  Follow up plan: Telephone appointment with care management team member scheduled for: 07/25/2023  Burman Nieves, [facility] Care Coordination Care Guide Direct Dial: [phone]

## 2023-07-14 ENCOUNTER — Telehealth: Payer: Self-pay | Admitting: Family

## 2023-07-14 ENCOUNTER — Other Ambulatory Visit: Payer: Self-pay

## 2023-07-14 ENCOUNTER — Other Ambulatory Visit (HOSPITAL_BASED_OUTPATIENT_CLINIC_OR_DEPARTMENT_OTHER): Payer: Self-pay

## 2023-07-14 MED ORDER — GLIPIZIDE ER 2.5 MG PO TB24
2.5000 mg | ORAL_TABLET | Freq: Every day | ORAL | 0 refills | Status: DC
  Filled 2023-07-14: qty 90, 90d supply, fill #0

## 2023-07-14 NOTE — Telephone Encounter (Signed)
Spoke with patient regarding Noel Christmas assistance. He needs to spend about $100 to reach 4% oop spend required by [facility] medication assistance program. Patient can fill Xarelto for #21 tablets and cost will be $100.50.  Patient will be in office Wed 09/11 to complete and sign medication assistance program paperwork.

## 2023-07-14 NOTE — Telephone Encounter (Signed)
Rx sent 

## 2023-07-14 NOTE — Telephone Encounter (Signed)
Prescription Request  07/14/2023  Is this a "Controlled Substance" medicine? No  LOV: 06/17/2023  What is the name of the medication or equipment? glipiZIDE (GLUCOTROL XL) 2.5   Have you contacted your pharmacy to request a refill? No   Which pharmacy would you like this sent to?  [facility] - [facility] [address], [address] [place] [address] Phone: [phone] Fax: [phone]    Patient notified that their request is being sent to the clinical staff for review and that they should receive a response within 2 business days.   Please advise at [facility] [phone]

## 2023-07-14 NOTE — Telephone Encounter (Signed)
Pt called & requested to discuss his medications with you at your at earliest convenience. Please advise.

## 2023-07-16 ENCOUNTER — Ambulatory Visit (INDEPENDENT_AMBULATORY_CARE_PROVIDER_SITE_OTHER): Payer: Medicare Other | Admitting: Pharmacist

## 2023-07-16 ENCOUNTER — Other Ambulatory Visit (HOSPITAL_BASED_OUTPATIENT_CLINIC_OR_DEPARTMENT_OTHER): Payer: Self-pay

## 2023-07-16 VITALS — BP 120/70 | HR 80

## 2023-07-16 DIAGNOSIS — I480 Paroxysmal atrial fibrillation: Secondary | ICD-10-CM

## 2023-07-16 DIAGNOSIS — Z79899 Other long term (current) drug therapy: Secondary | ICD-10-CM | POA: Diagnosis not present

## 2023-07-16 NOTE — Progress Notes (Signed)
07/16/2023 Name: Robert Bates MRN: [id number] DOB: [birth year]  Chief Complaint  Patient presents with   Medication Management    Robert Bates is a 82 y.o. year old male who presented for an office visit today.   They were referred to the pharmacist by their PCP for assistance in managing medication access.    Subjective:   Afib / hypertension:  Current medications: amiodarone 200mg  - take 0.5 tablet on MWF for rate control and Xarelto 20mg  daily for anticoagulation / stroke prevention.     Medication Access/Adherence  Patient reports affordability concerns with their medications: Yes  Patient reports access/transportation concerns to their pharmacy: No  Patient reports adherence concerns with their medications:  No     Patient is in Medicare coverage gap and cost of Xarelto is high. Has prescription ready for pick up currently for #21 tablets that is about $100. This will put him at total out of pocket spend for 2024 for medication of $923. He should now be able to apply for Xarelto medication assistance program.   We applied earlier in 2024 for LIS but patient was denied LIS due to his income being about $240 over yearly income limit.  Objective:  Vitals:   07/16/23 1159  BP: 120/70  Pulse: 80    Lab Results  Component Value Date   HGBA1C 6.7 (H) 04/03/2023    Lab Results  Component Value Date   CREATININE 1.00 06/18/2023   BUN 22 06/18/2023   NA 142 06/18/2023   K 3.9 06/18/2023   CL 104 06/18/2023   CO2 32 06/18/2023    Lab Results  Component Value Date   CHOL 127 10/07/2022   HDL 64 10/07/2022   LDLCALC 46 10/07/2022   LDLDIRECT 142.2 12/26/2008   TRIG 93 10/07/2022   CHOLHDL 2.0 10/07/2022    Medications Reviewed Today     Reviewed by [name], RPH-CPP (Pharmacist) on 07/16/23 at 1128  Med List Status: <None>   Medication Order Taking? Sig Documenting Provider Last Dose Status Informant  acetaminophen (TYLENOL) 500 MG tablet [id number]   Take 1-2 tablets (500-1,000 mg total) by mouth every 6 (six) hours as needed.  Patient taking differently: Take 1,000 mg by mouth daily as needed for headache, fever or moderate pain.   Ardelle Balls, PA-C  Active Self           Med Note Clydie Braun, Arturo Freundlich B   Wed Jul 16, 2023 11:25 AM)    albuterol (PROVENTIL) (2.5 MG/3ML) 0.083% nebulizer solution [id number]  Take 3 mLs (2.5 mg total) by nebulization every 6 (six) hours as needed for wheezing or shortness of breath. Charlott Holler, MD  Active Self, Pharmacy Records           Med Note [facility], [place] B   Thu Jun 06, 2022 11:35 AM)    amiodarone (PACERONE) 200 MG tablet [id number] Yes Take 1/2 tablet (100mg ) by mouth three days per week (M,W,F)  Patient taking differently: Take 100 mg by mouth every Monday, Wednesday, and Friday.   Nahser, Deloris Ping, MD Taking Active Self, Pharmacy Records           Med Note [facility], [place] B   Wed Jun 25, 2023  1:21 PM)    ASCORBIC ACID PO [id number]  Take 1 tablet by mouth daily. Vitamin C, unknown strength. [provider]  Active Self  atorvastatin (LIPITOR) 80 MG tablet [id number] Yes Take 1 tablet (80 mg total) by mouth daily. Nahser, Deloris Ping,  MD Taking Active Self, Pharmacy Records  Budeson-Glycopyrrol-Formoterol [facility] AEROSPHERE) 160-9-4.8 MCG/ACT AERO [id number] Yes Inhale 2 puffs into the lungs in the morning and at bedtime.  Patient taking differently: Inhale 2 puffs into the lungs 2 (two) times daily as needed (wheezing, shortness of breath).   Charlott Holler, MD Taking Active Self, Pharmacy Records           Med Note [facility], Otis Brace Apr 23, 2023 12:21 PM) Has some left from patient assistance program in 2023  clotrimazole-betamethasone (LOTRISONE) cream [id number] No Apply 1 Application topically 2 (two) times daily.  Patient not taking: Reported on 06/25/2023   Milderd Meager., MD Not Taking Active Self, Pharmacy Records           Med Note [facility], [place] B   Wed Jun 25, 2023  1:22 PM)    fluticasone [facility]) 50 MCG/ACT nasal spray [id number]  Place 2 sprays into both nostrils daily. Saguier, Kateri Mc  Active Self, Pharmacy Records           Med Note Michaela Corner   Tue Apr 29, 2023  1:38 PM) 04/29/23: Reports during [facility] call has not needed recently  furosemide (LASIX) 40 MG tablet [id number] Yes Take 1 tablet (40 mg total) by mouth daily. Sandford Craze, NP Taking Active   glipiZIDE (GLUCOTROL XL) 2.5 MG 24 hr tablet [id number] Yes Take 1 tablet (2.5 mg total) by mouth daily with breakfast. Sandford Craze, NP Taking Active   omeprazole (PRILOSEC) 40 MG capsule [id number] Yes Take 1 capsule (40 mg total) by mouth daily. Sandford Craze, NP Taking Active   potassium chloride (KLOR-CON) 10 MEQ tablet [id number] Yes Take 1 tablet (10 mEq total) by mouth daily. Tereso Newcomer T, PA-C Taking Active   pregabalin (LYRICA) 25 MG capsule [id number] Yes Take 1 capsule (25 mg total) by mouth 2 (two) times daily. Sandford Craze, NP Taking Active   rivaroxaban (XARELTO) 20 MG TABS tablet [id number] Yes Take 1 tablet (20 mg total) by mouth every evening with supper. Nahser, Deloris Ping, MD Taking Active   rivaroxaban (XARELTO) 20 MG TABS tablet [id number]  Take 1 tablet (20 mg total) by mouth every evening with supper. [name], RPH-CPP  Active   spironolactone (ALDACTONE) 25 MG tablet [id number] Yes Take 1 tablet (25 mg total) by mouth daily. Sandford Craze, NP Taking Active   tamsulosin [facility]) 0.4 MG CAPS capsule [id number] Yes Take 1 capsule (0.4 mg total) by mouth daily. Sandford Craze, NP Taking Active               Assessment/Plan:   Patient has spent [phone] out of pocket for medications in 2024. Assisted patient in completing application for medication assistance program for Xarelto. He was in office today and signed application. Also assisted patient in requested report from pharmacy for his 2024 out of pocket spend.  Forwarded Xarelto  medication assistance program  to PCP to review and sign.    If patient is not approved for Xarelto medication assistance program with [employer] / [employer], another option which patient used in 2023 is the [employer] (now called Xarelto with Me Coverage gap program). Cost to patient would be $89/30 days or $260 for 90 days.    Reviewed medication list and refill history with patient.   Reminded to get annual flu vaccine. Patient plans to get next week at PCP visit.   [name], PharmD Clinical Pharmacist [facility]

## 2023-07-17 ENCOUNTER — Telehealth: Payer: Self-pay | Admitting: Family

## 2023-07-17 NOTE — Telephone Encounter (Signed)
Jayla from the [employer] called to inform the PCP that they need financial information from [place] to keep him enrolled in the program. She mentioned that if they cannot obtain the required forms, such as the 1040 tax form, they can also accept a letter from [place] outlining the patient's household size and income. Please contact and advise the patient.  [employer] phone number: [phone]; fax: [phone].

## 2023-07-17 NOTE — Telephone Encounter (Signed)
Called patient to provide him with this information and the number to [place] assistance program to provide information requested by them.

## 2023-07-21 ENCOUNTER — Telehealth: Payer: Self-pay | Admitting: Family

## 2023-07-21 NOTE — Telephone Encounter (Signed)
Jill Side with [employer] called to advise that patient applied for their assistance program for Xarelto. She said they did a credit check for patient's income and it came back inconclusive. They tried to reach out to patient to get access to his 1040 form but their attempt has been unsuccessful. They can accept a hardship letter with income (monthly) along with a note that patient does not file his taxes (if that is the reason he doesn't have access to a 1040 form) . Letter can be faxed to [phone]. If there are any questions, please call [phone]

## 2023-07-22 ENCOUNTER — Emergency Department (HOSPITAL_BASED_OUTPATIENT_CLINIC_OR_DEPARTMENT_OTHER): Payer: Medicare Other

## 2023-07-22 ENCOUNTER — Encounter (HOSPITAL_BASED_OUTPATIENT_CLINIC_OR_DEPARTMENT_OTHER): Payer: Self-pay | Admitting: Pediatrics

## 2023-07-22 ENCOUNTER — Other Ambulatory Visit: Payer: Self-pay

## 2023-07-22 ENCOUNTER — Emergency Department (HOSPITAL_BASED_OUTPATIENT_CLINIC_OR_DEPARTMENT_OTHER)
Admission: EM | Admit: 2023-07-22 | Discharge: 2023-07-22 | Disposition: A | Discharge status: Home / Self Care | Payer: Medicare Other | Attending: Emergency Medicine | Admitting: Emergency Medicine

## 2023-07-22 ENCOUNTER — Ambulatory Visit (INDEPENDENT_AMBULATORY_CARE_PROVIDER_SITE_OTHER): Payer: Medicare Other | Admitting: Family

## 2023-07-22 VITALS — BP 125/78 | HR 79 | Temp 97.6°F | Resp 16 | Wt 174.0 lb

## 2023-07-22 DIAGNOSIS — G8929 Other chronic pain: Secondary | ICD-10-CM

## 2023-07-22 DIAGNOSIS — R1032 Left lower quadrant pain: Secondary | ICD-10-CM | POA: Diagnosis not present

## 2023-07-22 DIAGNOSIS — E119 Type 2 diabetes mellitus without complications: Secondary | ICD-10-CM | POA: Diagnosis not present

## 2023-07-22 DIAGNOSIS — Z23 Encounter for immunization: Secondary | ICD-10-CM | POA: Diagnosis not present

## 2023-07-22 DIAGNOSIS — I7143 Infrarenal abdominal aortic aneurysm, without rupture: Secondary | ICD-10-CM | POA: Diagnosis not present

## 2023-07-22 DIAGNOSIS — I4891 Unspecified atrial fibrillation: Secondary | ICD-10-CM | POA: Diagnosis not present

## 2023-07-22 DIAGNOSIS — K5792 Diverticulitis of intestine, part unspecified, without perforation or abscess without bleeding: Secondary | ICD-10-CM

## 2023-07-22 DIAGNOSIS — Z87891 Personal history of nicotine dependence: Secondary | ICD-10-CM | POA: Diagnosis not present

## 2023-07-22 DIAGNOSIS — Z85118 Personal history of other malignant neoplasm of bronchus and lung: Secondary | ICD-10-CM | POA: Diagnosis not present

## 2023-07-22 DIAGNOSIS — I480 Paroxysmal atrial fibrillation: Secondary | ICD-10-CM | POA: Insufficient documentation

## 2023-07-22 DIAGNOSIS — J449 Chronic obstructive pulmonary disease, unspecified: Secondary | ICD-10-CM | POA: Diagnosis not present

## 2023-07-22 DIAGNOSIS — M545 Low back pain, unspecified: Secondary | ICD-10-CM

## 2023-07-22 DIAGNOSIS — K449 Diaphragmatic hernia without obstruction or gangrene: Secondary | ICD-10-CM | POA: Diagnosis not present

## 2023-07-22 DIAGNOSIS — Z7901 Long term (current) use of anticoagulants: Secondary | ICD-10-CM | POA: Insufficient documentation

## 2023-07-22 DIAGNOSIS — Z7984 Long term (current) use of oral hypoglycemic drugs: Secondary | ICD-10-CM | POA: Diagnosis not present

## 2023-07-22 DIAGNOSIS — K219 Gastro-esophageal reflux disease without esophagitis: Secondary | ICD-10-CM

## 2023-07-22 LAB — COMPREHENSIVE METABOLIC PANEL
ALT: 16 U/L (ref 0–44)
AST: 14 U/L — ABNORMAL LOW (ref 15–41)
Albumin: 3.5 g/dL (ref 3.5–5.0)
Alkaline Phosphatase: 81 U/L (ref 38–126)
Anion gap: 15 (ref 5–15)
BUN: 17 mg/dL (ref 8–23)
CO2: 26 mmol/L (ref 22–32)
Calcium: 9.3 mg/dL (ref 8.9–10.3)
Chloride: 98 mmol/L (ref 98–111)
Creatinine, Ser: 1.04 mg/dL (ref 0.61–1.24)
GFR, Estimated: >60 mL/min (ref >60)
Glucose, Bld: 102 mg/dL — ABNORMAL HIGH (ref 70–99)
Potassium: 4.1 mmol/L (ref 3.5–5.1)
Sodium: 139 mmol/L (ref 135–145)
Total Bilirubin: 0.6 mg/dL (ref 0.3–1.2)
Total Protein: 6.6 g/dL (ref 6.5–8.1)

## 2023-07-22 LAB — CBC
HCT: 41 % (ref 39.0–52.0)
Hemoglobin: 12.8 g/dL — ABNORMAL LOW (ref 13.0–17.0)
MCH: 27.2 pg (ref 26.0–34.0)
MCHC: 31.2 g/dL (ref 30.0–36.0)
MCV: 87 fL (ref 80.0–100.0)
Platelets: 226 10*3/uL (ref 150–400)
RBC: 4.71 MIL/uL (ref 4.22–5.81)
RDW: 17.7 % — ABNORMAL HIGH (ref 11.5–15.5)
WBC: 9.5 10*3/uL (ref 4.0–10.5)
nRBC: 0 % (ref 0.0–0.2)

## 2023-07-22 LAB — LIPASE, BLOOD: Lipase: 24 U/L (ref 11–51)

## 2023-07-22 MED ORDER — ONDANSETRON HCL 4 MG/2ML IJ SOLN
4.0000 mg | Freq: Once | INTRAMUSCULAR | Status: AC
  Administered 2023-07-22: 4 mg via INTRAVENOUS
  Filled 2023-07-22: qty 2

## 2023-07-22 MED ORDER — SODIUM CHLORIDE 0.9 % IV SOLN: INTRAVENOUS | Status: DC

## 2023-07-22 MED ORDER — HYDROMORPHONE HCL 1 MG/ML IJ SOLN
0.5000 mg | Freq: Once | INTRAMUSCULAR | Status: AC
  Administered 2023-07-22: 0.5 mg via INTRAVENOUS
  Filled 2023-07-22: qty 1

## 2023-07-22 MED ORDER — AMIODARONE IV BOLUS ONLY 150 MG/100ML
150.0000 mg | Freq: Once | INTRAVENOUS | Status: AC
  Administered 2023-07-22: 150 mg via INTRAVENOUS
  Filled 2023-07-22: qty 100

## 2023-07-22 MED ORDER — HYDROMORPHONE HCL 1 MG/ML IJ SOLN: 1.0000 mg | Freq: Once | INTRAMUSCULAR | Status: DC

## 2023-07-22 MED ORDER — ALBUTEROL SULFATE HFA 108 (90 BASE) MCG/ACT IN AERS
2.0000 | INHALATION_SPRAY | Freq: Once | RESPIRATORY_TRACT | Status: AC
  Administered 2023-07-22: 2 via RESPIRATORY_TRACT
  Filled 2023-07-22: qty 6.7

## 2023-07-22 MED ORDER — AMOXICILLIN-POT CLAVULANATE 875-125 MG PO TABS
1.0000 | ORAL_TABLET | Freq: Two times a day (BID) | ORAL | 0 refills | Status: DC
  Filled 2023-07-22: qty 14, 7d supply, fill #0

## 2023-07-22 MED ORDER — AMOXICILLIN-POT CLAVULANATE 875-125 MG PO TABS
1.0000 | ORAL_TABLET | Freq: Once | ORAL | Status: AC
  Administered 2023-07-22: 1 via ORAL
  Filled 2023-07-22: qty 1

## 2023-07-22 NOTE — Assessment & Plan Note (Addendum)
Stable. Continue on Prilosec.

## 2023-07-22 NOTE — Assessment & Plan Note (Addendum)
New. Severe LLQ pain with guarding.  He has an iodine allergy.  I will send him to the ED for further evaluation and abdominal imaging. He will need to be premedicated for the contrast allergy.  I am concerned for diverticulitis with possible perforation or kidney stone.  Report was given to Dr. Deretha Emory the physician on duty at the [facility] ED.

## 2023-07-22 NOTE — Progress Notes (Signed)
Subjective:     Patient ID: Robert Bates, male    DOB: August 11, 1941, 82 y.o.   MRN: [id number]  Chief Complaint  Patient presents with   Back Pain    Here for follow up   Abdominal Pain    Complains of LLQ pain that started yesterday    HPI  Discussed the use of AI scribe software for clinical note transcription with the patient, who gave verbal consent to proceed.  History of Present Illness   The patient, with a history of multiple hernia repairs, presents with new onset left lower quadrant abdominal pain that started last night. The pain initially was circumferential but has since localized to the left lower quadrant. He denies any changes in bowel movements, eating, drinking, or vomiting. He admits to lifting heavy objects recently. The patient also has a scheduled appointment with a podiatrist for a foot issue. He has been experiencing back pain, which has improved. He has been following up with a back specialist for this issue.  Denies any issues with urination. He did have diverticulosis noted on previous Colonoscopy.     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   COPD (chronic obstructive pulmonary disease) (HCC)    Coronary artery disease    Diabetes type 2, controlled (HCC) 07/31/2017   Dyspnea    Dysrhythmia    a fib   GERD (gastroesophageal reflux disease)    Hematuria    refuses work up or referral - understands risks of morbidity / mortality - 11/2008, 12/2008   Heme positive  stool    History of hiatal hernia    History of kidney stones    Hyperlipemia    Meningioma (HCC) 10/25/2013   Follows with Dr. Coletta Memos.    Peripheral vascular disease (HCC)    Abdominal Aortic Aneursym   Pneumonia    as a child   Radiation 09/18/15-10/25/15   left lower lobe 70.2 Gy   Seizures (HCC) 02/18/2020   Tobacco abuse     Past Surgical History:  Procedure Laterality Date   CHOLECYSTECTOMY N/A 07/23/2017   Procedure: LAPAROSCOPIC CHOLECYSTECTOMY;  Surgeon: Rodman Pickle, MD;  Location: [facility];  Service: General;  Laterality: N/A;   CLIPPING OF ATRIAL APPENDAGE Left 05/08/2021   Procedure: CLIPPING OF ATRIAL APPENDAGE USING 45 ATRICLIP;  Surgeon: Linden Dolin, MD;  Location: [facility] OR;  Service: Open Heart Surgery;  Laterality: Left;   COLONOSCOPY     CORONARY ARTERY BYPASS GRAFT N/A 05/08/2021   Procedure: CORONARY ARTERY BYPASS GRAFTING (CABG)X 3 USING LEFT INTERNAL MAMMARY ARTERY AND RIGHT GREATER SAPEHNOUS VEIN;  Surgeon: Linden Dolin, MD;  Location: [facility] OR;  Service: Open Heart Surgery;  Laterality: N/A;   ENDOVEIN HARVEST OF GREATER SAPHENOUS VEIN Right 05/08/2021   Procedure: ENDOVEIN HARVEST OF GREATER SAPHENOUS VEIN;  Surgeon: Linden Dolin, MD;  Location: [facility] OR;  Service: Open Heart Surgery;  Laterality: Right;  EYE SURGERY Bilateral    Cataracts removed w/ lens implant   HERNIA REPAIR     Left 36 years ago . Right inguinal hernia repair 10-01-17 Dr. Sheliah Hatch   INGUINAL HERNIA REPAIR Right 10/01/2017   Procedure: RIGHT INGUINAL HERNIA REPAIR WITH MESH;  Surgeon: Kinsinger, De Blanch, MD;  Location: [facility];  Service: General;  Laterality: Right;  TAP BLOCK   INSERTION OF MESH Right 10/01/2017   Procedure: INSERTION OF MESH;  Surgeon: Sheliah Hatch De Blanch, MD;  Location: [facility];  Service: General;  Laterality: Right;   IR THORACENTESIS ASP PLEURAL SPACE W/IMG GUIDE  05/18/2021   IR THORACENTESIS ASP PLEURAL SPACE W/IMG GUIDE  06/07/2021   LEFT HEART  CATH AND CORONARY ANGIOGRAPHY N/A 04/20/2021   Procedure: LEFT HEART CATH AND CORONARY ANGIOGRAPHY;  Surgeon: Iran Ouch, MD;  [facility]: [facility] INVAS[facility];  Service: Cardiovascular;  Laterality: N/A;   TEE WITHOUT CARDIOVERSION N/A 05/08/2021   Procedure: TRANSESOPHAGEAL ECHOCARDIOGRAM (TEE);  Surgeon: Linden Dolin, MD;  Location[facility];  Service: Open Heart Surgery;  Laterality: N/A;   TONSILLECTOMY     TONSILLECTOMY     VIDEO BRONCHOSCOPY Bilateral 07/26/2015   Procedure: VIDEO BRONCHOSCOPY WITH FLUORO;  Surgeon: Nyoka Cowden, MD;  [facility];  Service: Cardiopulmonary;  Laterality: Bilateral;   VIDEO BRONCHOSCOPY WITH ENDOBRONCHIAL NAVIGATION N/A 08/23/2015   Procedure: VIDEO BRONCHOSCOPY WITH ENDOBRONCHIAL NAVIGATION;  Surgeon: Delight Ovens, MD;  [facility];  Service: Thoracic;  Laterality: N/A;   VIDEO BRONCHOSCOPY WITH ENDOBRONCHIAL ULTRASOUND N/A 08/23/2015   Procedure: VIDEO BRONCHOSCOPY WITH ENDOBRONCHIAL ULTRASOUND;  Surgeon: Delight Ovens, MD;  [facility]: [facility];  Service: Thoracic;  Laterality: N/A;    Family History  Problem Relation Age of Onset   Leukemia Father    Emphysema Father    Learning disabilities Son    Atrial fibrillation Son    Leukemia Other    Stroke Other     Social History   Socioeconomic History   Marital status: Widowed    Spouse name: Not on file   Number of children: 2   Years of education: Not on file   Highest education level: Not on file  Occupational History   Occupation: Retired    Associate Professor: [employer]    Comment: truck Air traffic controller: [employer]  Tobacco Use   Smoking status: Former    Current packs/day: 0.00    Average packs/day: 1 pack/day for 57.0 years (57.0 ttl pk-yrs)    Types: Cigarettes, Cigars    Start date: 08/07/1958    Quit date: 08/08/2015    Years since quitting: 7.9   Smokeless tobacco: Former    Types: Chew    Quit date: 11/04/1958   Tobacco comments:    Will smoke cigar  every once in awhile.  Vaping daily started a couple weeks ago.  04/18/22 hfb  Vaping Use   Vaping status: Some Days  Substance and Sexual Activity   Alcohol use: Not Currently    Alcohol/week: 0.0 standard drinks of alcohol   Drug use: No   Sexual activity: Not Currently  Other Topics Concern   Not on file  Social History Narrative   Not on file   Social Determinants of Health   Financial Resource Strain: Low Risk  (05/22/2023)   Overall Financial Resource Strain (CARDIA)    Difficulty of Paying Living Expenses: Not very hard  Food Insecurity: No Food Insecurity (05/19/2023)   Hunger Vital Sign  Worried About Programme researcher, broadcasting/film/video in the Last Year: Never true    Ran Out of Food in the Last Year: Never true  Transportation Needs: No Transportation Needs (05/19/2023)   PRAPARE - Administrator, Civil Service (Medical): No    Lack of Transportation (Non-Medical): No  Physical Activity: Insufficiently Active (08/05/2021)   Exercise Vital Sign    Days of Exercise per Week: 2 days    Minutes of Exercise per Session: 40 min  Stress: Stress Concern Present (05/22/2023)   [employer] of Occupational Health - Occupational Stress Questionnaire    Feeling of Stress : Rather much  Social Connections: Moderately Isolated (05/13/2022)   Social Connection and Isolation Panel [NHANES]    Frequency of Communication with Friends and Family: Twice a week    Frequency of Social Gatherings with Friends and Family: Once a week    Attends Religious Services: Never    Database administrator or Organizations: No    Attends Banker Meetings: Never    Marital Status: Married  Catering manager Violence: Not At Risk (04/22/2023)   Humiliation, Afraid, Rape, and Kick questionnaire    Fear of Current or Ex-Partner: No    Emotionally Abused: No    Physically Abused: No    Sexually Abused: No    No facility-administered medications prior to visit.   Outpatient Medications  Prior to Visit  Medication Sig Dispense Refill   acetaminophen (TYLENOL) 500 MG tablet Take 1-2 tablets (500-1,000 mg total) by mouth every 6 (six) hours as needed. (Patient taking differently: Take 1,000 mg by mouth daily as needed for headache, fever or moderate pain.) 30 tablet 0   albuterol (PROVENTIL) (2.5 MG/3ML) 0.083% nebulizer solution Take 3 mLs (2.5 mg total) by nebulization every 6 (six) hours as needed for wheezing or shortness of breath. 75 mL 12   amiodarone (PACERONE) 200 MG tablet Take 1/2 tablet (100mg ) by mouth three days per week (M,W,F) (Patient taking differently: Take 100 mg by mouth every Monday, Wednesday, and Friday.) 6 tablet 3   ASCORBIC ACID PO Take 1 tablet by mouth daily. Vitamin C, unknown strength.     atorvastatin (LIPITOR) 80 MG tablet Take 1 tablet (80 mg total) by mouth daily. 90 tablet 2   Budeson-Glycopyrrol-Formoterol (BREZTRI AEROSPHERE) 160-9-4.8 MCG/ACT AERO Inhale 2 puffs into the lungs in the morning and at bedtime. (Patient taking differently: Inhale 2 puffs into the lungs 2 (two) times daily as needed (wheezing, shortness of breath).) 10.7 g 5   clotrimazole-betamethasone (LOTRISONE) cream Apply 1 Application topically 2 (two) times daily. (Patient not taking: Reported on 06/25/2023) 45 g 2   fluticasone (FLONASE) 50 MCG/ACT nasal spray Place 2 sprays into both nostrils daily. 16 g 1   furosemide (LASIX) 40 MG tablet Take 1 tablet (40 mg total) by mouth daily. 90 tablet 0   glipiZIDE (GLUCOTROL XL) 2.5 MG 24 hr tablet Take 1 tablet (2.5 mg total) by mouth daily with breakfast. 90 tablet 0   omeprazole (PRILOSEC) 40 MG capsule Take 1 capsule (40 mg total) by mouth daily. 90 capsule 0   potassium chloride (KLOR-CON) 10 MEQ tablet Take 1 tablet (10 mEq total) by mouth daily. 90 tablet 3   pregabalin (LYRICA) 25 MG capsule Take 1 capsule (25 mg total) by mouth 2 (two) times daily. 60 capsule 0   rivaroxaban (XARELTO) 20 MG TABS tablet Take 1 tablet (20 mg  total) by mouth every evening with supper. 90 tablet  1   rivaroxaban (XARELTO) 20 MG TABS tablet Take 1 tablet (20 mg total) by mouth every evening with supper.     spironolactone (ALDACTONE) 25 MG tablet Take 1 tablet (25 mg total) by mouth daily. 90 tablet 1   tamsulosin (FLOMAX) 0.4 MG CAPS capsule Take 1 capsule (0.4 mg total) by mouth daily. 90 capsule 1    Allergies  Allergen Reactions   Iodine Swelling    Neck, gland swelling   Iohexol Swelling    Neck, gland swelling   Metformin And Related Diarrhea    ROS See HPI    Objective:    Physical Exam Constitutional:      General: He is not in acute distress.    Appearance: He is well-developed.  HENT:     Head: Normocephalic and atraumatic.  Cardiovascular:     Rate and Rhythm: Normal rate and regular rhythm.     Heart sounds: No murmur heard. Pulmonary:     Effort: Pulmonary effort is normal. No respiratory distress.     Breath sounds: Normal breath sounds. No wheezing or rales.  Abdominal:     General: Bowel sounds are normal.     Tenderness: There is abdominal tenderness in the right lower quadrant. There is guarding.  Skin:    General: Skin is warm and dry.  Neurological:     Mental Status: He is alert and oriented to person, place, and time.  Psychiatric:        Behavior: Behavior normal.        Thought Content: Thought content normal.      BP 125/78 (BP Location: Right Arm, Patient Position: Sitting, Cuff Size: Small)   Pulse 79   Temp 97.6 F (36.4 C) (Oral)   Resp 16   Wt 174 lb (78.9 kg)   SpO2 98%   BMI 27.25 kg/m  Wt Readings from Last 3 Encounters:  07/22/23 174 lb (78.9 kg)  07/22/23 174 lb (78.9 kg)  06/18/23 180 lb 8 oz (81.9 kg)       Assessment & Plan:   Problem List Items Addressed This Visit       Unprioritized   Low back pain    He notes significant improvement in his low back pain.        GERD - Primary    Stable. Continue on Prilosec.       COPD GOLD II     Stable.  Follow by Pulmonary        Abdominal pain, acute, left lower quadrant    New. Severe LLQ pain with guarding.  He has an iodine allergy.  I will send him to the ED for further evaluation and abdominal imaging. He will need to be premedicated for the contrast allergy.  I am concerned for diverticulitis with possible perforation or kidney stone.  Report was given to Dr. Deretha Emory the physician on duty at the [facility] ED.       Other Visit Diagnoses     Needs flu shot       Relevant Orders   Flu Vaccine Trivalent High Dose (Fluad) (Completed)       I am having [name] maintain his acetaminophen, Breztri Aerosphere, albuterol, fluticasone, amiodarone, atorvastatin, clotrimazole-betamethasone, ASCORBIC ACID PO, potassium chloride, omeprazole, spironolactone, tamsulosin, furosemide, rivaroxaban, pregabalin, rivaroxaban, and glipiZIDE.  No orders of the defined types were placed in this encounter.

## 2023-07-22 NOTE — Patient Instructions (Signed)
VISIT SUMMARY:  During your visit, we discussed your new onset of severe pain in the lower left side of your abdomen. You also mentioned that you have been experiencing back pain, which has improved. We also discussed your upcoming appointment with a podiatrist for a foot issue and your history of hernia operations. We also noted that you are due for your annual flu shot.  YOUR PLAN:  -ABDOMINAL PAIN: Your severe abdominal pain could be due to a variety of causes, such as inflammation of small pouches in your digestive tract (diverticulitis), a hernia, kidney stone or a blockage in your bowel. To get a better understanding of what's causing your pain, you should go to the emergency department for further evaluation and imaging.  -INFLUENZA VACCINATION: It's time for your annual flu shot, which can help protect you from the influenza virus. We administered the vaccine during your visit today.  -PODIATRY FOLLOW-UP: You have an appointment with a foot specialist (podiatrist) tomorrow. Please remember to attend this appointment.  -BACK PAIN: Your back pain has improved since your last visit. You should continue with the current treatment plan that your back specialist has recommended.    INSTRUCTIONS:  Please go to the emergency department for further evaluation of your abdominal pain. Remember to attend your podiatry appointment tomorrow. Continue with your current treatment plans for your back pain and hernia.

## 2023-07-22 NOTE — Telephone Encounter (Signed)
Called [employer] medication assistance program. They need letter of hardship that states patient is no longer working and what his monthly Medicare income is.  Will create letter and forward to PCP to review and sign.

## 2023-07-22 NOTE — Assessment & Plan Note (Signed)
Stable. Follow by Pulmonary

## 2023-07-22 NOTE — Assessment & Plan Note (Signed)
He notes significant improvement in his low back pain.

## 2023-07-22 NOTE — Telephone Encounter (Signed)
Please see message from 07/17/23 regarding patient's Robert Bates application for Xarelto medication assistance program.

## 2023-07-22 NOTE — ED Notes (Signed)
Fall risk armband Yellow socks Fall risk sign on door

## 2023-07-22 NOTE — ED Provider Notes (Addendum)
[name] [facility] Provider Note   CSN: [id number] Arrival date & time: 07/22/23  1123     History  Chief Complaint  Patient presents with   Abdominal Pain    Robert Bates is a 82 y.o. male.  Patient sent down from Dr. Arvil Chaco office [place] [facility] upstairs.  Patient with left lower quadrant abdominal pain that actually started yesterday.  Originally was both sides of the lower abdomen and now just left side of the abdomen with a lot of tenderness to palpation in that area.  Patient is never had a history of diverticulitis in the past.  Not associated with any diarrhea.  Associated with some nausea no vomiting.  Temp here 97.5 blood pressure 138/82 room air sats 96%.  Past medical history significant for for hyperlipidemia peripheral vascular disease gastroesophageal reflux disease COPD history of prior tobacco abuse type 2 diabetes lung cancer left lower lobe diagnosed in 2016.  Patient followed by Dr. Shirline Frees oncology but just followed once a year patient states that he has been disease-free for 8 years.  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  Patient states she has had hernia repairs in the past looks like they occurred in 2018.  And they were on the right side.  Coronary artery bypass graft in 2022.  Clipping of atrial appendage in July 2022.  Patient is a former smoker quit in 2016.  Started smoking in 1959.  In addition patient known to have atrial fibrillation.  Is on amiodarone and is also on Xarelto.  EKG here consistent with atrial fibrillation heart rate of 119.       Home Medications Prior to Admission medications   Medication Sig Start Date End Date Taking? Authorizing Provider  acetaminophen (TYLENOL) 500 MG tablet Take 1-2 tablets (500-1,000 mg total) by mouth every 6 (six) hours as needed. Patient taking differently: Take 1,000 mg by mouth daily as needed for headache, fever  or moderate pain. 05/16/21   Ardelle Balls, PA-C  albuterol (PROVENTIL) (2.5 MG/3ML) 0.083% nebulizer solution Take 3 mLs (2.5 mg total) by nebulization every 6 (six) hours as needed for wheezing or shortness of breath. 10/17/21   Charlott Holler, MD  amiodarone (PACERONE) 200 MG tablet Take 1/2 tablet (100mg ) by mouth three days per week (M,W,F) Patient taking differently: Take 100 mg by mouth every Monday, Wednesday, and Friday. 10/07/22   Nahser, Deloris Ping, MD  ASCORBIC ACID PO Take 1 tablet by mouth daily. Vitamin C, unknown strength.    [provider]  atorvastatin (LIPITOR) 80 MG tablet Take 1 tablet (80 mg total) by mouth daily. 01/17/23   Nahser, Deloris Ping, MD  Budeson-Glycopyrrol-Formoterol (BREZTRI AEROSPHERE) 160-9-4.8 MCG/ACT AERO Inhale 2 puffs into the lungs in the morning and at bedtime. Patient taking differently: Inhale 2 puffs into the lungs 2 (two) times daily as needed (wheezing, shortness of breath). 10/17/21   Charlott Holler, MD  clotrimazole-betamethasone (LOTRISONE) cream Apply 1 Application topically 2 (two) times daily. Patient not taking: Reported on 06/25/2023 04/07/23   Milderd Meager., MD  fluticasone [facility]) 50 MCG/ACT nasal spray Place 2 sprays into both nostrils daily. 09/04/22   Saguier, Ramon Dredge, PA-C  furosemide (LASIX) 40 MG tablet Take 1 tablet (40 mg total) by mouth daily. 05/20/23   Sandford Craze, NP  glipiZIDE (GLUCOTROL XL) 2.5 MG 24 hr tablet Take 1 tablet (2.5 mg total) by mouth daily with breakfast. 07/14/23  Sandford Craze, NP  omeprazole (PRILOSEC) 40 MG capsule Take 1 capsule (40 mg total) by mouth daily. 05/06/23   Sandford Craze, NP  potassium chloride (KLOR-CON) 10 MEQ tablet Take 1 tablet (10 mEq total) by mouth daily. 05/06/23   Tereso Newcomer T, PA-C  pregabalin (LYRICA) 25 MG capsule Take 1 capsule (25 mg total) by mouth 2 (two) times daily. 06/17/23   Sandford Craze, NP  rivaroxaban (XARELTO) 20 MG TABS tablet Take 1  tablet (20 mg total) by mouth every evening with supper. 06/12/23   Nahser, Deloris Ping, MD  rivaroxaban (XARELTO) 20 MG TABS tablet Take 1 tablet (20 mg total) by mouth every evening with supper. 06/25/23   Eckard, Tammy, RPH-CPP  spironolactone (ALDACTONE) 25 MG tablet Take 1 tablet (25 mg total) by mouth daily. 05/09/23   Sandford Craze, NP  tamsulosin (FLOMAX) 0.4 MG CAPS capsule Take 1 capsule (0.4 mg total) by mouth daily. 05/09/23 05/08/24  Sandford Craze, NP      Allergies    Iodine, Iohexol, and Metformin and related    Review of Systems   Review of Systems  Constitutional:  Negative for chills and fever.  HENT:  Negative for ear pain and sore throat.   Eyes:  Negative for pain and visual disturbance.  Respiratory:  Negative for cough and shortness of breath.   Cardiovascular:  Negative for chest pain and palpitations.  Gastrointestinal:  Positive for abdominal pain and nausea. Negative for vomiting.  Genitourinary:  Negative for dysuria and hematuria.  Musculoskeletal:  Negative for arthralgias and back pain.  Skin:  Negative for color change and rash.  Neurological:  Negative for seizures and syncope.  All other systems reviewed and are negative.   Physical Exam Updated Vital Signs BP 138/82 (BP Location: Left Arm)   Pulse (!) 29   Temp (!) 97.5 F (36.4 C) (Oral)   Resp 14   Ht 1.753 m (5\' 9" )   Wt 78.9 kg   SpO2 95%   BMI 25.70 kg/m  Physical Exam Vitals and nursing note reviewed.  Constitutional:      General: He is not in acute distress.    Appearance: Normal appearance. He is well-developed.  HENT:     Head: Normocephalic and atraumatic.     Mouth/Throat:     Mouth: Mucous membranes are moist.  Eyes:     Conjunctiva/sclera: Conjunctivae normal.  Cardiovascular:     Rate and Rhythm: Normal rate and regular rhythm.     Heart sounds: No murmur heard. Pulmonary:     Effort: Pulmonary effort is normal. No respiratory distress.     Breath sounds: Normal  breath sounds.  Abdominal:     Palpations: Abdomen is soft.     Tenderness: There is abdominal tenderness. There is guarding.  Musculoskeletal:        General: No swelling.     Cervical back: Normal range of motion and neck supple.  Skin:    General: Skin is warm and dry.     Capillary Refill: Capillary refill takes less than 2 seconds.  Neurological:     General: No focal deficit present.     Mental Status: He is alert and oriented to person, place, and time.  Psychiatric:        Mood and Affect: Mood normal.     ED Results / Procedures / Treatments   Labs (all labs ordered are listed, but only abnormal results are displayed) Labs Reviewed  COMPREHENSIVE METABOLIC PANEL - Abnormal;  Notable for the following components:      Result Value   Glucose, Bld 102 (*)    AST 14 (*)    All other components within normal limits  CBC - Abnormal; Notable for the following components:   Hemoglobin 12.8 (*)    RDW 17.7 (*)    All other components within normal limits  LIPASE, BLOOD  URINALYSIS, ROUTINE W REFLEX MICROSCOPIC    EKG None  Radiology No results found.  Procedures Procedures    Medications Ordered in ED Medications  0.9 %  sodium chloride infusion ( Intravenous New Bag/Given 07/22/23 1253)  albuterol (VENTOLIN HFA) 108 (90 Base) MCG/ACT inhaler 2 puff (2 puffs Inhalation Given 07/22/23 1148)  ondansetron (ZOFRAN) injection 4 mg (4 mg Intravenous Given 07/22/23 1253)  HYDROmorphone (DILAUDID) injection 0.5 mg (0.5 mg Intravenous Given 07/22/23 1253)    ED Course/ Medical Decision Making/ A&P                                 Medical Decision Making Amount and/or Complexity of Data Reviewed Labs: ordered. Radiology: ordered.  Risk Prescription drug management.   Patient with marked left lower quadrant abdominal tenderness.  Highly suspicious for possible diverticulitis.  CT scan will help differentiate.  Complete metabolic panel without any acute abnormalities  renal functions normal.  Lipase normal.  CBC without white blood cell count.  Hemoglobin 12.8 platelets 226.  Patient has significant dye allergy.  So CT scan will be done without oral or IV contrast.  Most likely will give [place] our diagnosis.  Decided to do this versus doing the pretreatment.  CT scan of the abdomen consistent with mild to moderate diverticulitis distal descending colon and sigmoid colon no complications.  Based on this patient can be treated with antibiotics.  Patient does not have any history of allergies to antibiotics.  So Augmentin may be appropriate.  And most convenient.  Patient with some atrial fibrillation known history of atrial fibrillation.  Heart rate kind of low 120s may be secondary to the diverticulitis do not feel that that needs intervention as long as he continues to take his meds.  He can follow back up with his primary care doctor for that.  Addendum: Plan is to treat him outpatient with Augmentin antibiotic for the diverticulitis.  Follow-up with his primary care doctor if not improved or return for anything new or worse.  Was getting ready to discharge and patient's atrial fibrillation went into a higher rate more around 1 40-1 50.  Patient tolerating this well.  Put a call into cardiology since patient already on amiodarone.  Patient seems to be a little confused but says he is taking his Xarelto on a regular basis.  So options would be cardioversion or amiodarone bolus or both.  Will see what cardiology recommends.  If it does not improve patient may require admission.  Patient turned over to evening physician Dr. Doran Durand.   Final Clinical Impression(s) / ED Diagnoses Final diagnoses:  Left lower quadrant abdominal pain  Paroxysmal atrial fibrillation [facility])    Rx / DC Orders ED Discharge Orders     None         Vanetta Mulders, MD 07/22/23 1337    Vanetta Mulders, MD 07/22/23 1440    Vanetta Mulders, MD 07/22/23 [phone]

## 2023-07-22 NOTE — ED Triage Notes (Signed)
C/O severe left sided lower abdominal pain started last night.

## 2023-07-22 NOTE — ED Provider Notes (Addendum)
Care of patient received from prior provider at 6:29 PM, please see their note for complete H/P and care plan.  Received handoff per ED course.  Clinical Course as of 07/22/23 1829  Tue Jul 22, 2023  1731 Waited 2-1/2 hours for cardiology call back with no response.  Will bolus amiodarone due to ongoing tachycardia. [CC]    Clinical Course User Index [CC] Glyn Ade, MD    Reassessment: Patient responded appropriately to amiodarone.  Favor that his tachycardia was from missed medications this morning. Heart rate 80 with irregularity on my study at this time. Discussed his diverticulitis.  He does not want to be admitted for it is requesting outpatient care and management.  Will treat with Augmentin as he has already gotten the first dose here in the ER.  Strict return precautions reinforced patient stable for outpatient care with strict return precautions reinforced.  Disposition:  I have considered need for hospitalization, however, considering all of the above, I believe this patient is stable for discharge at this time.  Patient/family educated about specific return precautions for given chief complaint and symptoms.  Patient/family educated about follow-up with PCP.     Patient/family expressed understanding of return precautions and need for follow-up. Patient spoken to regarding all imaging and laboratory results and appropriate follow up for these results. All education provided in verbal form with additional information in written form. Time was allowed for answering of patient questions. Patient discharged.    Emergency Department Medication Summary:   Medications  0.9 %  sodium chloride infusion ( Intravenous New Bag/Given 07/22/23 1253)  albuterol (VENTOLIN HFA) 108 (90 Base) MCG/ACT inhaler 2 puff (2 puffs Inhalation Given 07/22/23 1148)  ondansetron (ZOFRAN) injection 4 mg (4 mg Intravenous Given 07/22/23 1253)  HYDROmorphone (DILAUDID) injection 0.5 mg (0.5 mg  Intravenous Given 07/22/23 1253)  amoxicillin-clavulanate (AUGMENTIN) 875-125 MG per tablet 1 tablet (1 tablet Oral Given 07/22/23 1532)  amiodarone (NEXTERONE) 1.5 mg/mL IV bolus only 150 mg (0 mg Intravenous Stopped 07/22/23 1820)            Glyn Ade, MD 07/22/23 Izola Price    Glyn Ade, MD 07/22/23 [phone]

## 2023-07-22 NOTE — ED Notes (Signed)
Patient unable to urinate at this time.

## 2023-07-22 NOTE — Progress Notes (Signed)
Subjective:     Patient ID: Robert Bates, male    DOB: 10/17/1941, 82 y.o.   MRN: [id number]  Chief Complaint  Patient presents with   Back Pain    Here for follow up   Abdominal Pain    Complains of LLQ pain that started yesterday    Back Pain Associated symptoms include abdominal pain. Pertinent negatives include no chest pain or headaches.  Abdominal Pain Pertinent negatives include no headaches.    Discussed the use of AI scribe software for clinical note transcription with the patient, who gave verbal consent to proceed.  82 year old male presents to the clinic today for LLQ quadrant pain and follow up on back pain.   Last BM was yesterday  Patient states that the LLQ pain started yesterday. He states that he has had hernias in the past so now he is worried he has another hernia  He states that the pain pain is noted no matter what he does. He states that it is a 10/10. Tenderness noted upon light palpation.   He states that on a positive note he is no longer having pain in legs and the back pain is doing better  Pain is noted to be on the lower part of the abdomen and just starting to radiate to his whole lower abdomen.   Is on xarelto daily.    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   COPD (chronic obstructive pulmonary disease) (HCC)    Coronary artery disease    Diabetes type 2, controlled (HCC) 07/31/2017   Dyspnea    Dysrhythmia     a fib   GERD (gastroesophageal reflux disease)    Hematuria    refuses work up or referral - understands risks of morbidity / mortality - 11/2008, 12/2008   Heme positive stool    History of hiatal hernia    History of kidney stones    Hyperlipemia    Meningioma (HCC) 10/25/2013   Follows with Dr. Coletta Memos.    Peripheral vascular disease (HCC)    Abdominal Aortic Aneursym   Pneumonia    as a child   Radiation 09/18/15-10/25/15   left lower lobe 70.2 Gy   Seizures (HCC) 02/18/2020   Tobacco abuse     Past Surgical History:  Procedure Laterality Date   CHOLECYSTECTOMY N/A 07/23/2017   Procedure: LAPAROSCOPIC CHOLECYSTECTOMY;  Surgeon: Rodman Pickle, MD;  Location: [facility];  Service: General;  Laterality: N/A;   CLIPPING OF ATRIAL APPENDAGE Left 05/08/2021   Procedure: CLIPPING OF ATRIAL APPENDAGE USING 45 ATRICLIP;  Surgeon: Linden Dolin, MD;  Location: [facility];  Service: Open Heart Surgery;  Laterality: Left;   COLONOSCOPY     CORONARY ARTERY BYPASS GRAFT N/A 05/08/2021   Procedure: CORONARY ARTERY BYPASS GRAFTING (CABG)X 3 USING LEFT INTERNAL MAMMARY ARTERY AND RIGHT GREATER SAPEHNOUS VEIN;  Surgeon: Linden Dolin,  MD;  Location: [facility];  Service: Open Heart Surgery;  Laterality: N/A;   ENDOVEIN HARVEST OF GREATER SAPHENOUS VEIN Right 05/08/2021   Procedure: ENDOVEIN HARVEST OF GREATER SAPHENOUS VEIN;  Surgeon: Linden Dolin, MD;  Location: [facility];  Service: Open Heart Surgery;  Laterality: Right;   EYE SURGERY Bilateral    Cataracts removed w/ lens implant   HERNIA REPAIR     Left 36 years ago . Right inguinal hernia repair 10-01-17 Dr. Sheliah Hatch   INGUINAL HERNIA REPAIR Right 10/01/2017   Procedure: RIGHT INGUINAL HERNIA REPAIR WITH MESH;  Surgeon: Kinsinger, De Blanch, MD;  Location: [facility];  Service: General;  Laterality: Right;  TAP BLOCK   INSERTION OF MESH Right 10/01/2017   Procedure: INSERTION OF MESH;  Surgeon: Sheliah Hatch De Blanch, MD;  Location: [facility];   Service: General;  Laterality: Right;   IR THORACENTESIS ASP PLEURAL SPACE W/IMG GUIDE  05/18/2021   IR THORACENTESIS ASP PLEURAL SPACE W/IMG GUIDE  06/07/2021   LEFT HEART CATH AND CORONARY ANGIOGRAPHY N/A 04/20/2021   Procedure: LEFT HEART CATH AND CORONARY ANGIOGRAPHY;  Surgeon: Iran Ouch, MD;  Location: [facility];  Service: Cardiovascular;  Laterality: N/A;   TEE WITHOUT CARDIOVERSION N/A 05/08/2021   Procedure: TRANSESOPHAGEAL ECHOCARDIOGRAM (TEE);  Surgeon: Linden Dolin, MD;  Location[facility];  Service: Open Heart Surgery;  Laterality: N/A;   TONSILLECTOMY     TONSILLECTOMY     VIDEO BRONCHOSCOPY Bilateral 07/26/2015   Procedure: VIDEO BRONCHOSCOPY WITH FLUORO;  Surgeon: Nyoka Cowden, MD;  Location: [facility];  Service: Cardiopulmonary;  Laterality: Bilateral;   VIDEO BRONCHOSCOPY WITH ENDOBRONCHIAL NAVIGATION N/A 08/23/2015   Procedure: VIDEO BRONCHOSCOPY WITH ENDOBRONCHIAL NAVIGATION;  Surgeon: Delight Ovens, MD;  Location: [facility];  Service: Thoracic;  Laterality: N/A;   VIDEO BRONCHOSCOPY WITH ENDOBRONCHIAL ULTRASOUND N/A 08/23/2015   Procedure: VIDEO BRONCHOSCOPY WITH ENDOBRONCHIAL ULTRASOUND;  Surgeon: Delight Ovens, MD;  Location: [facility];  Service: Thoracic;  Laterality: N/A;    Family History  Problem Relation Age of Onset   Leukemia Father    Emphysema Father    Learning disabilities Son    Atrial fibrillation Son    Leukemia Other    Stroke Other     Social History   Socioeconomic History   Marital status: Widowed    Spouse name: Not on file   Number of children: 2   Years of education: Not on file   Highest education level: Not on file  Occupational History   Occupation: Retired    Associate Professor: [employer]    Comment: truck Air traffic controller: [employer]  Tobacco Use   Smoking status: Former    Current packs/day: 0.00    Average packs/day: 1 pack/day for 57.0 years (57.0 ttl pk-yrs)    Types: Cigarettes, Cigars    Start date:  08/07/1958    Quit date: 08/08/2015    Years since quitting: 7.9   Smokeless tobacco: Former    Types: Chew    Quit date: 11/04/1958   Tobacco comments:    Will smoke cigar every once in awhile.  Vaping daily started a couple weeks ago.  04/18/22 hfb  Vaping Use   Vaping status: Some Days  Substance and Sexual Activity   Alcohol use: Not Currently    Alcohol/week: 0.0 standard drinks of alcohol   Drug use: No   Sexual activity: Not Currently  Other Topics Concern   Not on file  Social History  Narrative   Not on file   Social Determinants of Health   Financial Resource Strain: Low Risk  (05/22/2023)   Overall Financial Resource Strain (CARDIA)    Difficulty of Paying Living Expenses: Not very hard  Food Insecurity: No Food Insecurity (05/19/2023)   Hunger Vital Sign    Worried About Running Out of Food in the Last Year: Never true    Ran Out of Food in the Last Year: Never true  Transportation Needs: No Transportation Needs (05/19/2023)   PRAPARE - Administrator, Civil Service (Medical): No    Lack of Transportation (Non-Medical): No  Physical Activity: Insufficiently Active (08/05/2021)   Exercise Vital Sign    Days of Exercise per Week: 2 days    Minutes of Exercise per Session: 40 min  Stress: Stress Concern Present (05/22/2023)   [employer] of Occupational Health - Occupational Stress Questionnaire    Feeling of Stress : Rather much  Social Connections: Moderately Isolated (05/13/2022)   Social Connection and Isolation Panel [NHANES]    Frequency of Communication with Friends and Family: Twice a week    Frequency of Social Gatherings with Friends and Family: Once a week    Attends Religious Services: Never    Database administrator or Organizations: No    Attends Banker Meetings: Never    Marital Status: Married  Catering manager Violence: Not At Risk (04/22/2023)   Humiliation, Afraid, Rape, and Kick questionnaire    Fear of Current or  Ex-Partner: No    Emotionally Abused: No    Physically Abused: No    Sexually Abused: No    Outpatient Medications Prior to Visit  Medication Sig Dispense Refill   acetaminophen (TYLENOL) 500 MG tablet Take 1-2 tablets (500-1,000 mg total) by mouth every 6 (six) hours as needed. (Patient taking differently: Take 1,000 mg by mouth daily as needed for headache, fever or moderate pain.) 30 tablet 0   albuterol (PROVENTIL) (2.5 MG/3ML) 0.083% nebulizer solution Take 3 mLs (2.5 mg total) by nebulization every 6 (six) hours as needed for wheezing or shortness of breath. 75 mL 12   amiodarone (PACERONE) 200 MG tablet Take 1/2 tablet (100mg ) by mouth three days per week (M,W,F) (Patient taking differently: Take 100 mg by mouth every Monday, Wednesday, and Friday.) 6 tablet 3   ASCORBIC ACID PO Take 1 tablet by mouth daily. Vitamin C, unknown strength.     atorvastatin (LIPITOR) 80 MG tablet Take 1 tablet (80 mg total) by mouth daily. 90 tablet 2   Budeson-Glycopyrrol-Formoterol (BREZTRI AEROSPHERE) 160-9-4.8 MCG/ACT AERO Inhale 2 puffs into the lungs in the morning and at bedtime. (Patient taking differently: Inhale 2 puffs into the lungs 2 (two) times daily as needed (wheezing, shortness of breath).) 10.7 g 5   clotrimazole-betamethasone (LOTRISONE) cream Apply 1 Application topically 2 (two) times daily. (Patient not taking: Reported on 06/25/2023) 45 g 2   fluticasone (FLONASE) 50 MCG/ACT nasal spray Place 2 sprays into both nostrils daily. 16 g 1   furosemide (LASIX) 40 MG tablet Take 1 tablet (40 mg total) by mouth daily. 90 tablet 0   glipiZIDE (GLUCOTROL XL) 2.5 MG 24 hr tablet Take 1 tablet (2.5 mg total) by mouth daily with breakfast. 90 tablet 0   omeprazole (PRILOSEC) 40 MG capsule Take 1 capsule (40 mg total) by mouth daily. 90 capsule 0   potassium chloride (KLOR-CON) 10 MEQ tablet Take 1 tablet (10 mEq total) by mouth daily. 90 tablet  3   pregabalin (LYRICA) 25 MG capsule Take 1 capsule (25  mg total) by mouth 2 (two) times daily. 60 capsule 0   rivaroxaban (XARELTO) 20 MG TABS tablet Take 1 tablet (20 mg total) by mouth every evening with supper. 90 tablet 1   rivaroxaban (XARELTO) 20 MG TABS tablet Take 1 tablet (20 mg total) by mouth every evening with supper.     spironolactone (ALDACTONE) 25 MG tablet Take 1 tablet (25 mg total) by mouth daily. 90 tablet 1   tamsulosin (FLOMAX) 0.4 MG CAPS capsule Take 1 capsule (0.4 mg total) by mouth daily. 90 capsule 1   No facility-administered medications prior to visit.    Allergies  Allergen Reactions   Iodine Swelling    Neck, gland swelling   Iohexol Swelling    Neck, gland swelling   Metformin And Related Diarrhea    Review of Systems  Constitutional:  Negative for chills.  HENT:  Negative for congestion, hearing loss and sinus pain.   Respiratory:  Positive for wheezing. Negative for cough.   Cardiovascular:  Negative for chest pain and palpitations.  Gastrointestinal:  Positive for abdominal pain.  Musculoskeletal:  Positive for back pain.  Neurological:  Negative for dizziness and headaches.  Psychiatric/Behavioral:  Negative for depression and suicidal ideas.        Objective:    Physical Exam Vitals reviewed.  Cardiovascular:     Comments: Hard to hear heart sounds over the wheezing noted in lung field  Pulmonary:     Breath sounds: Wheezing present.  Abdominal:     General: Bowel sounds are normal. There is distension.     Palpations: Abdomen is rigid.     Tenderness: There is abdominal tenderness in the right lower quadrant and left lower quadrant. There is guarding.     Comments: Patient was very tender upon palpation and had guarding to lower abdomen.   Skin:    General: Skin is warm.  Neurological:     General: No focal deficit present.     Mental Status: He is alert.  Psychiatric:        Mood and Affect: Mood normal.        Behavior: Behavior normal.      BP 125/78 (BP Location: Right Arm,  Patient Position: Sitting, Cuff Size: Small)   Pulse 79   Temp 97.6 F (36.4 C) (Oral)   Resp 16   Wt 174 lb (78.9 kg)   SpO2 98%   BMI 27.25 kg/m  Wt Readings from Last 3 Encounters:  07/22/23 174 lb (78.9 kg)  06/18/23 180 lb 8 oz (81.9 kg)  06/17/23 178 lb (80.7 kg)       Assessment & Plan:   Problem List Items Addressed This Visit   None   I am having [name] maintain his acetaminophen, Breztri Aerosphere, albuterol, fluticasone, amiodarone, atorvastatin, clotrimazole-betamethasone, ASCORBIC ACID PO, potassium chloride, omeprazole, spironolactone, tamsulosin, furosemide, rivaroxaban, pregabalin, rivaroxaban, and glipiZIDE.  No orders of the defined types were placed in this encounter.

## 2023-07-23 ENCOUNTER — Encounter: Payer: Self-pay | Admitting: Podiatry

## 2023-07-23 ENCOUNTER — Ambulatory Visit: Payer: Medicare Other | Admitting: Podiatry

## 2023-07-23 ENCOUNTER — Other Ambulatory Visit (HOSPITAL_BASED_OUTPATIENT_CLINIC_OR_DEPARTMENT_OTHER): Payer: Self-pay

## 2023-07-23 DIAGNOSIS — M48062 Spinal stenosis, lumbar region with neurogenic claudication: Secondary | ICD-10-CM | POA: Diagnosis not present

## 2023-07-23 DIAGNOSIS — M79675 Pain in left toe(s): Secondary | ICD-10-CM | POA: Diagnosis not present

## 2023-07-23 DIAGNOSIS — M79674 Pain in right toe(s): Secondary | ICD-10-CM

## 2023-07-23 DIAGNOSIS — B351 Tinea unguium: Secondary | ICD-10-CM

## 2023-07-23 NOTE — Progress Notes (Signed)
Subjective:  Patient ID: Robert Bates, male    DOB: July 28, 1941,  MRN: [id number]  Chief Complaint  Patient presents with   RFC    RFC    82 y.o. male presents with the above complaint. History confirmed with patient.  The nails curved and pinch and cause pain  Objective:  Physical Exam: warm, good capillary refill, no trophic changes or ulcerative lesions, normal DP and PT pulses, and normal sensory exam. Left Foot: dystrophic yellowed discolored nail plates with subungual debris and pincer nail deformity multiple toenails Right Foot: dystrophic yellowed discolored nail plates with subungual debris and pincer nail deformity multiple toenails  Assessment:   1. Pain due to onychomycosis of toenails of both feet      Plan:  Patient was evaluated and treated and all questions answered. -Mechanically debrided all nails 1-5 bilateral using sterile nail nipper and filed with dremel without incident  -Answered all patient questions -Patient to return  in 3 months for at risk foot care -Patient advised to call the office if any problems or questions arise in the meantime.     No follow-ups on file.

## 2023-07-23 NOTE — Telephone Encounter (Signed)
Letter of financial hardship was signed by PCP and faxed to Lin[facility] Dibbles / Laural Benes and [employer] patient assistance program today.

## 2023-07-25 ENCOUNTER — Other Ambulatory Visit (HOSPITAL_BASED_OUTPATIENT_CLINIC_OR_DEPARTMENT_OTHER): Payer: Self-pay

## 2023-07-25 ENCOUNTER — Telehealth: Payer: Self-pay | Admitting: Family

## 2023-07-25 ENCOUNTER — Ambulatory Visit (INDEPENDENT_AMBULATORY_CARE_PROVIDER_SITE_OTHER): Payer: Medicare Other

## 2023-07-25 ENCOUNTER — Ambulatory Visit: Payer: Medicare Other | Admitting: Primary Care

## 2023-07-25 ENCOUNTER — Encounter: Payer: Self-pay | Admitting: Primary Care

## 2023-07-25 ENCOUNTER — Ambulatory Visit: Payer: Self-pay

## 2023-07-25 VITALS — BP 102/70 | HR 75 | Temp 97.8°F | Ht 69.0 in | Wt 174.0 lb

## 2023-07-25 DIAGNOSIS — R0602 Shortness of breath: Secondary | ICD-10-CM | POA: Diagnosis not present

## 2023-07-25 DIAGNOSIS — J90 Pleural effusion, not elsewhere classified: Secondary | ICD-10-CM | POA: Diagnosis not present

## 2023-07-25 DIAGNOSIS — J441 Chronic obstructive pulmonary disease with (acute) exacerbation: Secondary | ICD-10-CM

## 2023-07-25 MED ORDER — METHYLPREDNISOLONE ACETATE 80 MG/ML IJ SUSP
80.0000 mg | Freq: Once | INTRAMUSCULAR | Status: AC
Start: 2023-07-25 — End: 2023-07-25
  Administered 2023-07-25: 80 mg via INTRAMUSCULAR

## 2023-07-25 MED ORDER — LEVALBUTEROL HCL 0.63 MG/3ML IN NEBU
0.6300 mg | INHALATION_SOLUTION | Freq: Once | RESPIRATORY_TRACT | Status: AC
Start: 2023-07-25 — End: 2023-07-25
  Administered 2023-07-25: 0.63 mg via RESPIRATORY_TRACT

## 2023-07-25 MED ORDER — PREDNISONE 10 MG PO TABS
ORAL_TABLET | ORAL | 0 refills | Status: AC
  Filled 2023-07-25: qty 20, 8d supply, fill #0

## 2023-07-25 NOTE — Progress Notes (Signed)
@Patient  ID: Robert Bates, male    DOB: 1941/03/02, 82 y.o.   MRN: [id number]  Chief Complaint  Patient presents with   Acute Visit    Referring provider: Sandford Craze, NP  HPI: 82 year old male, former smoker quit in 2016.  Past medical history significant for heart failure, coronary artery disease s/p CABG, hypertension, A-fib, COPD Gold 2, lung cancer, pleural effusion, 2 diabetes. Patient of Dr. Celine Mans, last seen on 03/26/2023.  07/25/2023 Patient presents today for acute visit. Hx recurrent left sided pleural effusion. He reports increased sob and wheezing symptoms, these are chronic but appear somewhat worse last couple of months. He only coughs at night when lying flat. Cough does not last long. He is currently taking Augmentin for diverticulitis. Stomach pain is still presents but not as severe. His last BM was this morning. No blood in stool. He is using Breztri morning and evening, he may occasionally miss a dose. He takes lasix 40mg  daily, has some leg swelling. CXR today showed left pleural effusion has decreased in size since previous study.    Allergies  Allergen Reactions   Iodine Swelling    Neck, gland swelling   Iohexol Swelling    Neck, gland swelling   Metformin And Related Diarrhea    Immunization History  Administered Date(s) Administered   Fluad Quad(high Dose 65+) 07/23/2019, 09/04/2020, 07/13/2021, 08/16/2022   Fluad Trivalent(High Dose 65+) 07/22/2023   Influenza Split 08/02/2011, 08/18/2012   Influenza Whole 08/17/2008   Influenza, High Dose Seasonal PF 08/09/2015, 09/30/2016, 07/30/2017, 08/07/2018   Influenza,inj,Quad PF,6+ Mos 07/27/2013, 08/09/2014   PFIZER Comirnaty(Gray Top)Covid-19 Tri-Sucrose Vaccine 02/16/2021   PFIZER(Purple Top)SARS-COV-2 Vaccination 01/28/2020, 02/21/2020, 09/04/2020   Pfizer Covid-19 Vaccine Bivalent Booster 38yrs & up 08/03/2021   Pneumococcal Conjugate-13 08/09/2014   Pneumococcal Polysaccharide-23 05/14/2010    Td 07/25/2008    Past Medical History:  Diagnosis Date   Anxiety    Aortic dilatation (HCC) 05/13/2022   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   COPD (chronic obstructive pulmonary disease) (HCC)    Coronary artery disease    Diabetes type 2, controlled (HCC) 07/31/2017   Diverticulitis 07/29/2023   Dyspnea    Dysrhythmia    a fib   GERD (gastroesophageal reflux disease)    Hematuria    refuses work up or referral - understands risks of morbidity / mortality - 11/2008, 12/2008   Heme positive stool    History of hiatal hernia    History of kidney stones    Hyperlipemia    Meningioma (HCC) 10/25/2013   Follows with Dr. Coletta Memos.    Peripheral vascular disease (HCC)    Abdominal Aortic Aneursym   Pneumonia    as a child   Radiation 09/18/15-10/25/15   left lower lobe 70.2 Gy   Seizures (HCC) 02/18/2020   Tobacco abuse     Tobacco History: Social History   Tobacco Use  Smoking Status Former   Current packs/day: 0.00   Average packs/day: 1 pack/day for 57.0 years (57.0 ttl pk-yrs)   Types: Cigarettes, Cigars   Start date: 08/07/1958   Quit date: 08/08/2015   Years since quitting: 7.9  Smokeless Tobacco Former   Types: Chew   Quit date: 11/04/1958  Tobacco Comments   Will smoke cigar every once in awhile.  Vaping daily  started a couple weeks ago.  04/18/22 hfb   Counseling given: Not Answered Tobacco comments: Will smoke cigar every once in awhile.  Vaping daily started a couple weeks ago.  04/18/22 hfb   Outpatient Medications Prior to Visit  Medication Sig Dispense Refill   acetaminophen (TYLENOL) 500 MG tablet Take 1-2 tablets (500-1,000 mg total) by mouth every 6 (six) hours as needed. (Patient taking differently: Take 1,000 mg by mouth daily as needed for headache, fever or moderate pain.)  30 tablet 0   albuterol (PROVENTIL) (2.5 MG/3ML) 0.083% nebulizer solution Take 3 mLs (2.5 mg total) by nebulization every 6 (six) hours as needed for wheezing or shortness of breath. 75 mL 12   amiodarone (PACERONE) 200 MG tablet Take 1/2 tablet (100mg ) by mouth three days per week (M,W,F) (Patient taking differently: Take 100 mg by mouth every Monday, Wednesday, and Friday.) 6 tablet 3   amoxicillin-clavulanate (AUGMENTIN) 875-125 MG tablet Take 1 tablet by mouth every 12 (twelve) hours. 14 tablet 0   ASCORBIC ACID PO Take 1 tablet by mouth daily. Vitamin C, unknown strength.     atorvastatin (LIPITOR) 80 MG tablet Take 1 tablet (80 mg total) by mouth daily. 90 tablet 2   Budeson-Glycopyrrol-Formoterol (BREZTRI AEROSPHERE) 160-9-4.8 MCG/ACT AERO Inhale 2 puffs into the lungs in the morning and at bedtime. (Patient taking differently: Inhale 2 puffs into the lungs 2 (two) times daily as needed (wheezing, shortness of breath).) 10.7 g 5   fluticasone (FLONASE) 50 MCG/ACT nasal spray Place 2 sprays into both nostrils daily. 16 g 1   furosemide (LASIX) 40 MG tablet Take 1 tablet (40 mg total) by mouth daily. 90 tablet 0   glipiZIDE (GLUCOTROL XL) 2.5 MG 24 hr tablet Take 1 tablet (2.5 mg total) by mouth daily with breakfast. 90 tablet 0   omeprazole (PRILOSEC) 40 MG capsule Take 1 capsule (40 mg total) by mouth daily. 90 capsule 0   potassium chloride (KLOR-CON) 10 MEQ tablet Take 1 tablet (10 mEq total) by mouth daily. 90 tablet 3   pregabalin (LYRICA) 25 MG capsule Take 1 capsule (25 mg total) by mouth 2 (two) times daily. 60 capsule 0   rivaroxaban (XARELTO) 20 MG TABS tablet Take 1 tablet (20 mg total) by mouth every evening with supper. 90 tablet 1   spironolactone (ALDACTONE) 25 MG tablet Take 1 tablet (25 mg total) by mouth daily. 90 tablet 1   tamsulosin (FLOMAX) 0.4 MG CAPS capsule Take 1 capsule (0.4 mg total) by mouth daily. 90 capsule 1   rivaroxaban (XARELTO) 20 MG TABS tablet Take 1  tablet (20 mg total) by mouth every evening with supper.     clotrimazole-betamethasone (LOTRISONE) cream Apply 1 Application topically 2 (two) times daily. (Patient not taking: Reported on 06/25/2023) 45 g 2   No facility-administered medications prior to visit.      Review of Systems  Review of Systems  Constitutional:  Positive for fatigue.  HENT: Negative.    Respiratory:  Positive for shortness of breath and wheezing.   Cardiovascular:  Positive for leg swelling.     Physical Exam  BP 102/70 (BP Location: Left Arm, Patient Position: Sitting, Cuff Size: Normal)   Pulse 75   Temp 97.8 F (36.6 C) (Oral)   Ht 5\' 9"  (1.753 m)   Wt 174 lb (78.9 kg)   SpO2 98%   BMI 25.70 kg/m  Physical Exam Constitutional:      General: He is not in acute distress.  Appearance: Normal appearance.  Cardiovascular:     Rate and Rhythm: Normal rate and regular rhythm.     Comments: RRR/ hx afib Pulmonary:     Effort: Pulmonary effort is normal.     Breath sounds: Wheezing present.  Musculoskeletal:        General: Normal range of motion.     Right lower leg: Edema present.     Left lower leg: Edema present.  Skin:    General: Skin is warm and dry.  Neurological:     General: No focal deficit present.     Mental Status: He is alert and oriented to person, place, and time. Mental status is at baseline.  Psychiatric:        Mood and Affect: Mood normal.        Behavior: Behavior normal.        Thought Content: Thought content normal.        Judgment: Judgment normal.      Lab Results:  CBC    Component Value Date/Time   WBC 12.2 (H) 07/29/2023 0944   RBC 4.47 07/29/2023 0944   HGB 12.2 (L) 07/29/2023 0944   HGB 11.2 (L) 06/18/2023 1125   HGB 14.7 06/17/2017 1315   HGB 14.8 08/17/2015 1318   HCT 38.5 (L) 07/29/2023 0944   HCT 43.8 06/17/2017 1315   HCT 44.4 08/17/2015 1318   PLT 307.0 07/29/2023 0944   PLT 268 06/18/2023 1125   PLT 230 06/17/2017 1315   PLT 276  08/17/2015 1318   MCV 86.1 07/29/2023 0944   MCV 93 06/17/2017 1315   MCV 91.9 08/17/2015 1318   MCH 27.2 07/22/2023 1136   MCHC 31.6 07/29/2023 0944   RDW 18.9 (H) 07/29/2023 0944   RDW 13.5 06/17/2017 1315   RDW 13.3 08/17/2015 1318   LYMPHSABS 0.8 07/29/2023 0944   LYMPHSABS 1.2 06/17/2017 1315   LYMPHSABS 2.6 08/17/2015 1318   MONOABS 1.2 (H) 07/29/2023 0944   MONOABS 0.8 08/17/2015 1318   EOSABS 0.0 07/29/2023 0944   EOSABS 0.2 06/17/2017 1315   BASOSABS 0.0 07/29/2023 0944   BASOSABS 0.0 06/17/2017 1315   BASOSABS 0.1 08/17/2015 1318    BMET    Component Value Date/Time   NA 139 07/29/2023 0944   NA 141 04/02/2023 1123   NA 140 06/17/2017 1315   K 4.4 07/29/2023 0944   K 4.5 06/17/2017 1315   CL 100 07/29/2023 0944   CO2 31 07/29/2023 0944   CO2 28 06/17/2017 1315   GLUCOSE 134 (H) 07/29/2023 0944   GLUCOSE 95 06/17/2017 1315   BUN 29 (H) 07/29/2023 0944   BUN 16 04/02/2023 1123   BUN 18.1 06/17/2017 1315   CREATININE 0.96 07/29/2023 0944   CREATININE 1.00 06/18/2023 1125   CREATININE 0.84 01/17/2023 1552   CREATININE 0.8 06/17/2017 1315   CALCIUM 10.0 07/29/2023 0944   CALCIUM 9.8 06/17/2017 1315   GFRNONAA >60 07/22/2023 1136   GFRNONAA >60 06/18/2023 1125   GFRAA >60 06/20/2020 1405    BNP    Component Value Date/Time   BNP 63 07/25/2023 1540    ProBNP No results found for: "PROBNP"  Imaging: DG Chest 2 View  Result Date: 07/25/2023 CLINICAL DATA:  Recurrent pleural effusion. EXAM: CHEST - 2 VIEW COMPARISON:  Two-view chest x-ray 04/22/2023 FINDINGS: The heart size is normal. Atrial clipping noted. Small left pleural effusion is decreased from the prior chest radiographs scarring in the left upper lobe is stable. The right lung is  clear. IMPRESSION: 1. Decreasing small left pleural effusion. 2. Stable scarring in the left upper lobe. Electronically Signed   By: Marin Roberts M.D.   On: 07/25/2023 15:33   CT ABDOMEN PELVIS WO  CONTRAST  Result Date: 07/22/2023 CLINICAL DATA:  Severe left lower quadrant pain and left flank pain beginning last night. EXAM: CT ABDOMEN AND PELVIS WITHOUT CONTRAST TECHNIQUE: Multidetector CT imaging of the abdomen and pelvis was performed following the standard protocol without IV contrast. RADIATION DOSE REDUCTION: This exam was performed according to the departmental dose-optimization program which includes automated exposure control, adjustment of the mA and/or kV according to patient size and/or use of iterative reconstruction technique. COMPARISON:  05/06/2022 FINDINGS: Lower chest: Small left pleural effusion, which has decreased in size since previous study. Hepatobiliary: Stable small hepatic cysts. No mass visualized on this unenhanced exam. Prior cholecystectomy. No evidence of biliary obstruction. Pancreas: No mass or inflammatory process visualized on this unenhanced exam. Spleen:  Within normal limits in size. Adrenals/Urinary tract: Several tiny less than 5 mm left renal calculi are noted. No evidence of ureteral calculi or hydronephrosis. 2.2 cm calculus again seen in the urinary bladder. Stomach/Bowel: Moderate hiatal hernia again seen. Mild-to-moderate diverticulitis is seen involving the junction of the descending and sigmoid colon. No evidence of bowel obstruction or abscess. Vascular/Lymphatic: No pathologically enlarged lymph nodes identified. 3.3 cm infrarenal abdominal aortic aneurysm, which remains stable. Reproductive:  Stable mildly enlarged prostate. Other:  None. Musculoskeletal:  No suspicious bone lesions identified. IMPRESSION: Mild-to-moderate diverticulitis involving the junction of the descending and sigmoid colon. No evidence of abscess or other complication. Stable 2.2 cm urinary bladder calculus. No evidence of ureteral calculi or hydronephrosis. Stable mildly enlarged prostate. Stable 3.3 cm infrarenal abdominal aortic aneurysm. Moderate hiatal hernia. Small left pleural  effusion, decreased in size since previous study. Electronically Signed   By: Danae Orleans M.D.   On: 07/22/2023 14:26     Assessment & Plan:   COPD exacerbation (HCC) Treating for suspected COPD flare up, received levalbuterol and depo medrol 80mg . Sending in prednisoen taper. Currently on Augmentin for mild diverticulitis. Checking BNP due to hx heart failure and LE swelling, diuretic may need to be adjusted for short time. VSS, O2 98% RA. Wheezing on exam to upper airway.    Recommendations: - Continue Breztri two puffs morning and evening - Start mucinex 600mg  twice daily x 1 week  - Continue Augmentin antibiotic until complete  - Start prednisone taper tomorrow   Office treatment - Levalbuterol nebulizer x1 - Depo-medrol 80mg  IM x1  Orders: - Labs (bnp- ordered) - CXR (done)  Folllow-up - 1-2 weeks with either Dr. Celine Mans or Waynetta Sandy NP   Pleural effusion -  CXR today showed left pleural effusion has decreased in size since previous study    08/03/2023

## 2023-07-25 NOTE — Telephone Encounter (Signed)
Pt states he is dropping off for forms for Tammy to look over. States he does know what they are for and wanted her to review the. Forms placed in folder up front.

## 2023-07-25 NOTE — Patient Instructions (Addendum)
Recommendations: - Continue Breztri two puffs morning and evening - Start mucinex 600mg  twice daily x 1 week  - Continue Augmentin antibiotic until complete  - Start prednisone taper tomorrow   Office treatment - Levalbuterol nebulizer x1 - Depo-medrol 80mg  IM x1  Orders: - Labs (bnp- ordered) - CXR (done)  Folllow-up - 1-2 weeks with either Dr. Celine Mans or Waynetta Sandy NP

## 2023-07-25 NOTE — Patient Instructions (Addendum)
Visit Information  Thank you for taking time to visit with me today. Please don't hesitate to contact me if I can be of assistance to you.   Following are the goals we discussed today:  Continue to take medications as prescribed. Continue to attend provider visits as scheduled Continue to eat healthy, lean meats, vegetables, fruits, avoid saturated and transfats Continue to contact provider with health questions and concerns as needed   Our next appointment is by telephone on 08/19/23 at 1:15 pm  Please call the care guide team at [phone] if you need to cancel or reschedule your appointment.   If you are experiencing a Mental Health or Behavioral Health Crisis or need someone to talk to, please call the Suicide and Crisis Lifeline: 988 call the [place] National Suicide Prevention Lifeline: (918)179-6521 or TTY: 6311216665 TTY 669-197-8376) to talk to a trained counselor call 1-800-273-TALK (toll free, 24 hour hotline)  Kathyrn Sheriff, RN, MSN, BSN, CCM Care Management Coordinator [phone]

## 2023-07-25 NOTE — Patient Outreach (Signed)
Care Coordination   Follow Up Visit Note   07/25/2023 Name: Robert Bates MRN: [id number] DOB: [birth year]  Robert Bates is a 82 y.o. year old male who sees Sandford Craze, NP for primary care. I spoke with  Robert Bates by phone today.  What matters to the patients health and wellness today?  Robert Bates reports he has been having some difficulty with breathing-inhalers not working as well. He is currently awaiting a cxr with pulmonologist office to evaluate. He reports he is taking the antibiotic prescribed at ED visit on 07/22/23 for diverticulitis. He reports he continues to perform daily weights and reports he has not gained any weight. Upcoming appointment with Primary provider on 07/29/23; Cardiologist 08/04/23 and clinical pharmacist on 08/06/23.  Goals Addressed             This Visit's Progress    contnue to improve post hospitalization       Interventions Today    Flowsheet Row Most Recent Value  Chronic Disease   Chronic disease during today's visit Chronic Obstructive Pulmonary Disease (COPD), Congestive Heart Failure (CHF), Hypertension (HTN), Atrial Fibrillation (AFib)  General Interventions   General Interventions Discussed/Reviewed General Interventions Reviewed, Doctor Visits  Doctor Visits Discussed/Reviewed Doctor Visits Reviewed, PCP, Specialist  PCP/Specialist Visits Compliance with follow-up visit  [reviewed upcoming follow up appointments]  Education Interventions   Education Provided Provided Education  [reviewed signs/symptoms of HF exacerbation]  Provided Verbal Education On Other, When to see the doctor, Medication, Nutrition  [advised continue taking medications as prescribed, education around diverticulits and how antibiotics help. advised to take antibiotics until all gone or changed by provider. advsied to attend provider visits as scheduled.]  Nutrition Interventions   Nutrition Discussed/Reviewed Nutrition Reviewed  Pharmacy Interventions   Pharmacy  Dicussed/Reviewed Pharmacy Topics Reviewed            SDOH assessments and interventions completed:  No  Care Coordination Interventions:  Yes, provided   Follow up plan: Follow up call scheduled for 08/19/23    Encounter Outcome:  Patient Visit Completed   Kathyrn Sheriff, RN, MSN, BSN, CCM Care Management Coordinator [phone]

## 2023-07-25 NOTE — Progress Notes (Signed)
Please call Mr Feinberg and let him know CXR showed decreasing small left pleural effusion, this is what I told him in office. Radiologist confirming read

## 2023-07-25 NOTE — Telephone Encounter (Signed)
Pt came in to drop off his handicap form. States he will pick this up at his next appt. Form placed in pcp's folder.

## 2023-07-26 LAB — BRAIN NATRIURETIC PEPTIDE: Brain Natriuretic Peptide: 63 pg/mL (ref <100)

## 2023-07-28 NOTE — Telephone Encounter (Signed)
Reviewed letter that patient dropped off. It mentioned several pieces of information that was needed - including patient name, DOB, pharmacy records which was odd because all that is on the application and could not apply without this name on the application.  Called Robert Bates to clarify and provide any information. Per the [employer] they reviewed his application and all information needed was there. They did receive letter of financial hardship 07/24/2023 and their insurnace department is not reviewing Robert Bates appication. Expects a decision this week.  Patient was notified.

## 2023-07-28 NOTE — Telephone Encounter (Signed)
Form started and placed in provider's folder

## 2023-07-29 ENCOUNTER — Other Ambulatory Visit: Payer: Self-pay

## 2023-07-29 ENCOUNTER — Other Ambulatory Visit (HOSPITAL_BASED_OUTPATIENT_CLINIC_OR_DEPARTMENT_OTHER): Payer: Self-pay

## 2023-07-29 ENCOUNTER — Ambulatory Visit (INDEPENDENT_AMBULATORY_CARE_PROVIDER_SITE_OTHER): Payer: Medicare Other | Admitting: Family

## 2023-07-29 VITALS — BP 120/100 | HR 105 | Temp 98.4°F | Resp 16 | Wt 178.0 lb

## 2023-07-29 DIAGNOSIS — K5792 Diverticulitis of intestine, part unspecified, without perforation or abscess without bleeding: Secondary | ICD-10-CM | POA: Diagnosis not present

## 2023-07-29 DIAGNOSIS — J90 Pleural effusion, not elsewhere classified: Secondary | ICD-10-CM | POA: Diagnosis not present

## 2023-07-29 DIAGNOSIS — J441 Chronic obstructive pulmonary disease with (acute) exacerbation: Secondary | ICD-10-CM | POA: Diagnosis not present

## 2023-07-29 DIAGNOSIS — I482 Chronic atrial fibrillation, unspecified: Secondary | ICD-10-CM | POA: Insufficient documentation

## 2023-07-29 DIAGNOSIS — R000 Tachycardia, unspecified: Secondary | ICD-10-CM

## 2023-07-29 DIAGNOSIS — R252 Cramp and spasm: Secondary | ICD-10-CM | POA: Insufficient documentation

## 2023-07-29 HISTORY — DX: Diverticulitis of intestine, part unspecified, without perforation or abscess without bleeding: K57.92

## 2023-07-29 LAB — BASIC METABOLIC PANEL
BUN: 29 mg/dL — ABNORMAL HIGH (ref 6–23)
CO2: 31 mEq/L (ref 19–32)
Calcium: 10 mg/dL (ref 8.4–10.5)
Chloride: 100 mEq/L (ref 96–112)
Creatinine, Ser: 0.96 mg/dL (ref 0.40–1.50)
GFR: 73.82 mL/min (ref >60.00)
Glucose, Bld: 134 mg/dL — ABNORMAL HIGH (ref 70–99)
Potassium: 4.4 mEq/L (ref 3.5–5.1)
Sodium: 139 mEq/L (ref 135–145)

## 2023-07-29 LAB — CBC WITH DIFFERENTIAL/PLATELET
Basophils Absolute: 0 10*3/uL (ref 0.0–0.1)
Basophils Relative: 0.1 % (ref 0.0–3.0)
Eosinophils Absolute: 0 10*3/uL (ref 0.0–0.7)
Eosinophils Relative: 0.2 % (ref 0.0–5.0)
HCT: 38.5 % — ABNORMAL LOW (ref 39.0–52.0)
Hemoglobin: 12.2 g/dL — ABNORMAL LOW (ref 13.0–17.0)
Lymphocytes Relative: 6.4 % — ABNORMAL LOW (ref 12.0–46.0)
Lymphs Abs: 0.8 10*3/uL (ref 0.7–4.0)
MCHC: 31.6 g/dL (ref 30.0–36.0)
MCV: 86.1 fl (ref 78.0–100.0)
Monocytes Absolute: 1.2 10*3/uL — ABNORMAL HIGH (ref 0.1–1.0)
Monocytes Relative: 10 % (ref 3.0–12.0)
Neutro Abs: 10.2 10*3/uL — ABNORMAL HIGH (ref 1.4–7.7)
Neutrophils Relative %: 83.3 % — ABNORMAL HIGH (ref 43.0–77.0)
Platelets: 307 10*3/uL (ref 150.0–400.0)
RBC: 4.47 Mil/uL (ref 4.22–5.81)
RDW: 18.9 % — ABNORMAL HIGH (ref 11.5–15.5)
WBC: 12.2 10*3/uL — ABNORMAL HIGH (ref 4.0–10.5)

## 2023-07-29 LAB — MAGNESIUM: Magnesium: 1.9 mg/dL (ref 1.5–2.5)

## 2023-07-29 MED ORDER — DILTIAZEM HCL ER 120 MG PO TB24
120.0000 mg | ORAL_TABLET | Freq: Every day | ORAL | 2 refills | Status: DC
  Filled 2023-07-29 (×2): qty 30, 30d supply, fill #0

## 2023-07-29 MED ORDER — DILTIAZEM HCL ER 120 MG PO TB24: 120.0000 mg | ORAL_TABLET | Freq: Every day | ORAL | 0 refills | Status: DC

## 2023-07-29 NOTE — Addendum Note (Signed)
Addended by: Wilford Corner on: 07/29/2023 04:06 PM   Modules accepted: Orders

## 2023-07-29 NOTE — Assessment & Plan Note (Signed)
Pt saw Pulmonology on 9/20 and was placed on prednisone taper for COPD exacerbation.

## 2023-07-29 NOTE — Assessment & Plan Note (Addendum)
Pt reports good compliance with amiodarone. He is anticoagulated with xarelto.  EKG is performed and personally reviewed, notes AF with rate 127 bpm.  SOB is at baseline per patient. Clinically euvolemic.   -Add diltiazem CD 120mg  daily to improve rate control. -Continue amiodarone/xarelto. -Notify cardiologist of change. -Return visit on Friday 08/01/2023 for follow-up and labs. -If increased shortness of breath or palpitations, patient to go to ER.

## 2023-07-29 NOTE — Assessment & Plan Note (Signed)
Clinically improving.  Complete augmentin.

## 2023-07-29 NOTE — Assessment & Plan Note (Signed)
Chronic. Clinically unchanged.  Monitor.

## 2023-07-29 NOTE — Progress Notes (Signed)
Subjective:     Patient ID: Robert Bates, male    DOB: Alann Avey 02, 1942, 82 y.o.   MRN: [id number]  Chief Complaint  Patient presents with   Follow-up    Here for ED follow up    HPI  Discussed the use of AI scribe software for clinical note transcription with the patient, who gave verbal consent to proceed.  Patient is an 82 year old male that presents to the clinic today for a follow up after being seen in the emergency room. Patient was seen in ER on 07/22/23 due to left lower quadrant pain.  Patient states that his left lower quad is no longer hurting after starting the antibiotics. He was placed on Augmentin for diverticulitis. He states that the only thing that the antibiotic is doing is giving him diarrhea.   Heart rate is still elevated today and he states that he is taking his medication and not missing any doses.   Placed on prednisone taper due to pleural effusion on the left side given by pulmonary   Last two weeks hands are cramping a lot. Nothing is helping. He has done massaging and heat with no help.      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   COPD (chronic obstructive pulmonary disease) (HCC)    Coronary artery disease    Diabetes type 2, controlled (HCC) 07/31/2017   Dyspnea    Dysrhythmia    a fib   GERD (gastroesophageal reflux disease)    Hematuria    refuses work up or referral - understands risks of morbidity / mortality - 11/2008, 12/2008   Heme positive stool     History of hiatal hernia    History of kidney stones    Hyperlipemia    Meningioma (HCC) 10/25/2013   Follows with Dr. Coletta Memos.    Peripheral vascular disease (HCC)    Abdominal Aortic Aneursym   Pneumonia    as a child   Radiation 09/18/15-10/25/15   left lower lobe 70.2 Gy   Seizures (HCC) 02/18/2020   Tobacco abuse     Past Surgical History:  Procedure Laterality Date   CHOLECYSTECTOMY N/A 07/23/2017   Procedure: LAPAROSCOPIC CHOLECYSTECTOMY;  Surgeon: Rodman Pickle, MD;  Location: [facility];  Service: General;  Laterality: N/A;   CLIPPING OF ATRIAL APPENDAGE Left 05/08/2021   Procedure: CLIPPING OF ATRIAL APPENDAGE USING 45 ATRICLIP;  Surgeon: Linden Dolin, MD;  Location: [facility];  Service: Open Heart Surgery;  Laterality: Left;   COLONOSCOPY     CORONARY ARTERY BYPASS GRAFT N/A 05/08/2021   Procedure: CORONARY ARTERY BYPASS GRAFTING (CABG)X 3 USING LEFT INTERNAL MAMMARY ARTERY AND RIGHT GREATER SAPEHNOUS VEIN;  Surgeon: Linden Dolin, MD;  Location: [facility];  Service: Open Heart Surgery;  Laterality: N/A;   ENDOVEIN HARVEST OF GREATER SAPHENOUS VEIN Right 05/08/2021   Procedure: ENDOVEIN HARVEST OF GREATER SAPHENOUS VEIN;  Surgeon: Linden Dolin, MD;  Location: [facility];  Service: Open Heart Surgery;  Laterality: Right;   EYE SURGERY Bilateral    Cataracts removed w/ lens implant   HERNIA REPAIR     Left 36 years ago . Right inguinal hernia repair 10-01-17 Dr. Sheliah Hatch   INGUINAL HERNIA REPAIR Right 10/01/2017   Procedure: RIGHT INGUINAL HERNIA REPAIR WITH MESH;  Surgeon: Kinsinger, De Blanch, MD;  Location: [facility];  Service: General;  Laterality: Right;  TAP BLOCK   INSERTION OF MESH Right 10/01/2017   Procedure: INSERTION OF MESH;  Surgeon: Sheliah Hatch De Blanch, MD;  Location: [facility];  Service: General;  Laterality: Right;   IR THORACENTESIS ASP PLEURAL SPACE W/IMG GUIDE  05/18/2021   IR THORACENTESIS ASP PLEURAL SPACE W/IMG GUIDE  06/07/2021   LEFT HEART CATH  AND CORONARY ANGIOGRAPHY N/A 04/20/2021   Procedure: LEFT HEART CATH AND CORONARY ANGIOGRAPHY;  Surgeon: Iran Ouch, MD;  Location: [facility];  Service: Cardiovascular;  Laterality: N/A;   TEE WITHOUT CARDIOVERSION N/A 05/08/2021   Procedure: TRANSESOPHAGEAL ECHOCARDIOGRAM (TEE);  Surgeon: Linden Dolin, MD;  Location: [facility];  Service: Open Heart Surgery;  Laterality: N/A;   TONSILLECTOMY     TONSILLECTOMY     VIDEO BRONCHOSCOPY Bilateral 07/26/2015   Procedure: VIDEO BRONCHOSCOPY WITH FLUORO;  Surgeon: Nyoka Cowden, MD;  Location: [facility];  Service: Cardiopulmonary;  Laterality: Bilateral;   VIDEO BRONCHOSCOPY WITH ENDOBRONCHIAL NAVIGATION N/A 08/23/2015   Procedure: VIDEO BRONCHOSCOPY WITH ENDOBRONCHIAL NAVIGATION;  Surgeon: Delight Ovens, MD;  Location: [facility];  Service: Thoracic;  Laterality: N/A;   VIDEO BRONCHOSCOPY WITH ENDOBRONCHIAL ULTRASOUND N/A 08/23/2015   Procedure: VIDEO BRONCHOSCOPY WITH ENDOBRONCHIAL ULTRASOUND;  Surgeon: Delight Ovens, MD;  Location: [facility];  Service: Thoracic;  Laterality: N/A;    Family History  Problem Relation Age of Onset   Leukemia Father    Emphysema Father    Learning disabilities Son    Atrial fibrillation Son    Leukemia Other    Stroke Other     Social History   Socioeconomic History   Marital status: Widowed    Spouse name: Not on file   Number of children: 2   Years of education: Not on file   Highest education level: Not on file  Occupational History   Occupation: Retired    Associate Professor: [employer]    Comment: truck Air traffic controller: [employer]  Tobacco Use   Smoking status: Former    Current packs/day: 0.00    Average packs/day: 1 pack/day for 57.0 years (57.0 ttl pk-yrs)    Types: Cigarettes, Cigars    Start date: 08/07/1958    Quit date: 08/08/2015    Years since quitting: 7.9   Smokeless tobacco: Former    Types: Chew    Quit date: 11/04/1958   Tobacco comments:    Will smoke cigar every  once in awhile.  Vaping daily started a couple weeks ago.  04/18/22 hfb  Vaping Use   Vaping status: Some Days  Substance and Sexual Activity   Alcohol use: Not Currently    Alcohol/week: 0.0 standard drinks of alcohol   Drug use: No   Sexual activity: Not Currently  Other Topics Concern   Not on file  Social History Narrative   Not on file   Social Determinants of Health   Financial Resource Strain: Low Risk  (05/22/2023)   Overall Financial Resource Strain (CARDIA)    Difficulty of Paying Living Expenses: Not very hard  Food Insecurity: No Food Insecurity (05/19/2023)   Hunger Vital Sign    Worried About Running Out of Food in the Last Year: Never true    Ran Out of Food in the Last Year: Never true  Transportation Needs: No Transportation Needs (05/19/2023)   PRAPARE - Administrator, Civil Service (Medical): No    Lack of Transportation (Non-Medical): No  Physical Activity: Insufficiently Active (08/05/2021)   Exercise Vital Sign    Days of Exercise per Week: 2 days    Minutes of Exercise per Session: 40 min  Stress: Stress Concern Present (05/22/2023)   [employer] of Occupational Health - Occupational Stress Questionnaire    Feeling of Stress : Rather much  Social Connections: Moderately Isolated (05/13/2022)   Social Connection and Isolation Panel [NHANES]    Frequency of Communication with Friends and Family: Twice a week    Frequency of Social Gatherings with Friends and Family: Once a week    Attends Religious Services: Never    Database administrator or Organizations: No    Attends Banker Meetings: Never    Marital Status: Married  Catering manager Violence: Not At Risk (04/22/2023)   Humiliation, Afraid, Rape, and Kick questionnaire    Fear of Current or Ex-Partner: No    Emotionally Abused: No    Physically Abused: No    Sexually Abused: No    Outpatient Medications Prior to Visit  Medication Sig Dispense Refill   acetaminophen  (TYLENOL) 500 MG tablet Take 1-2 tablets (500-1,000 mg total) by mouth every 6 (six) hours as needed. (Patient taking differently: Take 1,000 mg by mouth daily as needed for headache, fever or moderate pain.) 30 tablet 0   albuterol (PROVENTIL) (2.5 MG/3ML) 0.083% nebulizer solution Take 3 mLs (2.5 mg total) by nebulization every 6 (six) hours as needed for wheezing or shortness of breath. 75 mL 12   amiodarone (PACERONE) 200 MG tablet Take 1/2 tablet (100mg ) by mouth three days per week (M,W,F) (Patient taking differently: Take 100 mg by mouth every Monday, Wednesday, and Friday.) 6 tablet 3   amoxicillin-clavulanate (AUGMENTIN) 875-125 MG tablet Take 1 tablet by mouth every 12 (twelve) hours. 14 tablet 0   ASCORBIC ACID PO Take 1 tablet by mouth daily. Vitamin C, unknown strength.     atorvastatin (LIPITOR) 80 MG tablet Take 1 tablet (80 mg total) by mouth daily. 90 tablet 2   Budeson-Glycopyrrol-Formoterol (BREZTRI AEROSPHERE) 160-9-4.8 MCG/ACT AERO Inhale 2 puffs into the lungs in the morning and at bedtime. (Patient taking differently: Inhale 2 puffs into the lungs 2 (two) times daily as needed (wheezing, shortness of breath).) 10.7 g 5   fluticasone (FLONASE) 50 MCG/ACT nasal spray Place 2 sprays into both nostrils daily. 16 g 1   furosemide (LASIX) 40 MG tablet Take 1 tablet (40 mg total) by mouth daily. 90 tablet 0   glipiZIDE (GLUCOTROL XL) 2.5 MG 24 hr tablet Take 1 tablet (2.5 mg total) by mouth daily with breakfast. 90 tablet 0   omeprazole (PRILOSEC) 40 MG capsule Take 1 capsule (40 mg total) by mouth daily. 90 capsule 0   potassium chloride (KLOR-CON) 10 MEQ tablet Take 1 tablet (10 mEq total) by mouth daily. 90 tablet 3   predniSONE (DELTASONE) 10 MG tablet Take 4 tablets (40 mg total) by mouth daily for 2 days, THEN 3 tablets (30 mg total) daily for 2 days, THEN 2 tablets (20 mg total) daily for 2 days, THEN 1 tablet (  10 mg total) daily for 2 days. 20 tablet 0   pregabalin (LYRICA) 25 MG  capsule Take 1 capsule (25 mg total) by mouth 2 (two) times daily. 60 capsule 0   rivaroxaban (XARELTO) 20 MG TABS tablet Take 1 tablet (20 mg total) by mouth every evening with supper. 90 tablet 1   rivaroxaban (XARELTO) 20 MG TABS tablet Take 1 tablet (20 mg total) by mouth every evening with supper.     spironolactone (ALDACTONE) 25 MG tablet Take 1 tablet (25 mg total) by mouth daily. 90 tablet 1   tamsulosin (FLOMAX) 0.4 MG CAPS capsule Take 1 capsule (0.4 mg total) by mouth daily. 90 capsule 1   No facility-administered medications prior to visit.    Allergies  Allergen Reactions   Iodine Swelling    Neck, gland swelling   Iohexol Swelling    Neck, gland swelling   Metformin And Related Diarrhea    Review of Systems  Constitutional:  Negative for chills and fever.  HENT:  Negative for congestion, hearing loss, sore throat and tinnitus.   Eyes:  Negative for blurred vision and double vision.  Respiratory:  Negative for cough and hemoptysis.   Cardiovascular:  Negative for chest pain.  Gastrointestinal:  Negative for heartburn and vomiting.  Genitourinary:  Negative for dysuria.  Musculoskeletal:  Negative for back pain and neck pain.  Neurological:  Negative for dizziness and headaches.  Psychiatric/Behavioral:  Negative for depression and suicidal ideas.        Objective:    Physical Exam Vitals reviewed.  Constitutional:      Appearance: Normal appearance.  HENT:     Head: Normocephalic.  Cardiovascular:     Rate and Rhythm: Tachycardia present. Rhythm irregular.     Pulses: Normal pulses.     Heart sounds: Normal heart sounds.  Pulmonary:     Breath sounds: Wheezing (left side) present.  Abdominal:     General: Abdomen is flat. Bowel sounds are normal.     Palpations: Abdomen is soft.  Musculoskeletal:        General: Normal range of motion.     Cervical back: Normal range of motion.  Skin:    General: Skin is warm.     Capillary Refill: Capillary refill  takes less than 2 seconds.  Neurological:     General: No focal deficit present.     Mental Status: He is alert and oriented to person, place, and time. Mental status is at baseline.  Psychiatric:        Mood and Affect: Mood normal.        Behavior: Behavior normal.        Thought Content: Thought content normal.        Judgment: Judgment normal.      BP (!) 119/102 (BP Location: Right Arm, Patient Position: Sitting, Cuff Size: Small)   Pulse (!) 105   Temp 98.4 F (36.9 C) (Oral)   Resp 16   Wt 178 lb (80.7 kg)   SpO2 99%   BMI 26.29 kg/m  Wt Readings from Last 3 Encounters:  07/29/23 178 lb (80.7 kg)  07/25/23 174 lb (78.9 kg)  07/22/23 174 lb (78.9 kg)       Assessment & Plan:   Problem List Items Addressed This Visit   None   I am having [name] maintain his acetaminophen, Breztri Aerosphere, albuterol, fluticasone, amiodarone, atorvastatin, ASCORBIC ACID PO, potassium chloride, omeprazole, spironolactone, tamsulosin, furosemide, rivaroxaban, pregabalin, rivaroxaban, glipiZIDE, amoxicillin-clavulanate, and  predniSONE.  No orders of the defined types were placed in this encounter.

## 2023-07-29 NOTE — Assessment & Plan Note (Signed)
  New onset of bilateral hand cramping, described as similar to a charley horse. No associated neck pain or recent heavy lifting. -Observe for now, check electrolytes, no immediate intervention planned.

## 2023-07-29 NOTE — Patient Instructions (Signed)
VISIT SUMMARY:  During your recent visit, we discussed your ongoing issues with atrial fibrillation and recent diverticulitis, as well as a new symptom of hand cramping. Your abdominal pain from diverticulitis has improved with antibiotics, but you've been experiencing a sensation of needing to defecate without actual bowel movements. We also discussed your hand cramping, which has been worsening over the past month. Your heart rate was elevated during the visit, which is related to your atrial fibrillation.  YOUR PLAN:  -DIVERTICULITIS: Diverticulitis is an inflammation or infection of small pouches called diverticula that develop along the walls of the intestines. You should continue your current antibiotic regimen, with two days remaining, and drink plenty of fluids to prevent dehydration.  -HAND CRAMPING: Hand cramping can be caused by a variety of conditions, from dehydration to nerve damage. At this time, we will observe your symptoms and no immediate intervention is planned.  -ATRIAL FIBRILLATION: Atrial fibrillation is an irregular and often rapid heart rate that can increase your risk of strokes, heart failure and other heart-related complications. We will add diltiazem CD 120mg  daily to your current medication, amiodarone, to improve your heart rate control. Your cardiologist will be notified of this change.  INSTRUCTIONS:  Please return for a follow-up visit on Friday, August 01, 2023, for further evaluation and labs. If you experience increased shortness of breath or palpitations, go to the emergency room immediately. We will also complete the necessary paperwork for your handicap parking permit and order labs for today's visit.

## 2023-07-29 NOTE — Progress Notes (Addendum)
Subjective:     Patient ID: Robert Bates, male    DOB: [birth year], 82 y.o.   MRN: [id number]  Chief Complaint  Patient presents with   Follow-up    Here for ED follow up    HPI  Discussed the use of AI scribe software for clinical note transcription with the patient, who gave verbal consent to proceed.  History of Present Illness    The patient, with a history of atrial fibrillation and recent diverticulitis, presents for follow-up after an ER visit for abdominal pain on 07/22/23. The pain, which was diagnosed as diverticulitis in the ER, has resolved after starting antibiotics. However, the patient reports a sensation of needing to defecate without actual bowel movements since starting the antibiotics. He denies diarrhea.  Reports one formed stool this AM.   In addition to the gastrointestinal symptoms, the patient reports a new symptom of hand cramping, similar to a charley horse. This symptom has been occurring for about a month and seems to be worsening. The cramping can occur in both hands and is not relieved by squeezing balls or applying heat or cold. The patient denies any new or worsening neck pain.         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   COPD (chronic obstructive pulmonary disease) (HCC)    Coronary artery disease    Diabetes type 2, controlled (HCC) 07/31/2017   Dyspnea    Dysrhythmia    a fib   GERD (gastroesophageal reflux disease)    Hematuria    refuses work  up or referral - understands risks of morbidity / mortality - 11/2008, 12/2008   Heme positive stool    History of hiatal hernia    History of kidney stones    Hyperlipemia    Meningioma (HCC) 10/25/2013   Follows with Dr. Coletta Memos.    Peripheral vascular disease (HCC)    Abdominal Aortic Aneursym   Pneumonia    as a child   Radiation 09/18/15-10/25/15   left lower lobe 70.2 Gy   Seizures (HCC) 02/18/2020   Tobacco abuse     Past Surgical History:  Procedure Laterality Date   CHOLECYSTECTOMY N/A 07/23/2017   Procedure: LAPAROSCOPIC CHOLECYSTECTOMY;  Surgeon: Rodman Pickle, MD;  Location: [facility];  Service: General;  Laterality: N/A;   CLIPPING OF ATRIAL APPENDAGE Left 05/08/2021   Procedure: CLIPPING OF ATRIAL APPENDAGE USING 45 ATRICLIP;  Surgeon: Linden Dolin, MD;  Location: [facility];  Service: Open Heart Surgery;  Laterality: Left;   COLONOSCOPY     CORONARY ARTERY BYPASS GRAFT N/A 05/08/2021   Procedure: CORONARY ARTERY BYPASS GRAFTING (CABG)X 3 USING LEFT INTERNAL MAMMARY ARTERY AND RIGHT GREATER SAPEHNOUS VEIN;  Surgeon: Linden Dolin, MD;  Location: [facility];  Service: Open Heart Surgery;  Laterality: N/A;   ENDOVEIN HARVEST OF GREATER SAPHENOUS VEIN Right 05/08/2021   Procedure: ENDOVEIN HARVEST OF GREATER SAPHENOUS VEIN;  Surgeon:  Linden Dolin, MD;  Location: [facility];  Service: Open Heart Surgery;  Laterality: Right;   EYE SURGERY Bilateral    Cataracts removed w/ lens implant   HERNIA REPAIR     Left 36 years ago . Right inguinal hernia repair 10-01-17 Dr. Sheliah Hatch   INGUINAL HERNIA REPAIR Right 10/01/2017   Procedure: RIGHT INGUINAL HERNIA REPAIR WITH MESH;  Surgeon: Kinsinger, De Blanch, MD;  Location: [facility];  Service: General;  Laterality: Right;  TAP BLOCK   INSERTION OF MESH Right 10/01/2017   Procedure: INSERTION OF MESH;  Surgeon: Sheliah Hatch De Blanch, MD;  Location: [facility];  Service: General;  Laterality: Right;   IR THORACENTESIS ASP PLEURAL SPACE  W/IMG GUIDE  05/18/2021   IR THORACENTESIS ASP PLEURAL SPACE W/IMG GUIDE  06/07/2021   LEFT HEART CATH AND CORONARY ANGIOGRAPHY N/A 04/20/2021   Procedure: LEFT HEART CATH AND CORONARY ANGIOGRAPHY;  Surgeon: Iran Ouch, MD;  Location: [facility];  Service: Cardiovascular;  Laterality: N/A;   TEE WITHOUT CARDIOVERSION N/A 05/08/2021   Procedure: TRANSESOPHAGEAL ECHOCARDIOGRAM (TEE);  Surgeon: Linden Dolin, MD;  Location: [facility];  Service: Open Heart Surgery;  Laterality: N/A;   TONSILLECTOMY     TONSILLECTOMY     VIDEO BRONCHOSCOPY Bilateral 07/26/2015   Procedure: VIDEO BRONCHOSCOPY WITH FLUORO;  Surgeon: Nyoka Cowden, MD;  Location: [facility];  Service: Cardiopulmonary;  Laterality: Bilateral;   VIDEO BRONCHOSCOPY WITH ENDOBRONCHIAL NAVIGATION N/A 08/23/2015   Procedure: VIDEO BRONCHOSCOPY WITH ENDOBRONCHIAL NAVIGATION;  Surgeon: Delight Ovens, MD;  Location: [facility];  Service: Thoracic;  Laterality: N/A;   VIDEO BRONCHOSCOPY WITH ENDOBRONCHIAL ULTRASOUND N/A 08/23/2015   Procedure: VIDEO BRONCHOSCOPY WITH ENDOBRONCHIAL ULTRASOUND;  Surgeon: Delight Ovens, MD;  Location: [facility];  Service: Thoracic;  Laterality: N/A;    Family History  Problem Relation Age of Onset   Leukemia Father    Emphysema Father    Learning disabilities Son    Atrial fibrillation Son    Leukemia Other    Stroke Other     Social History   Socioeconomic History   Marital status: Widowed    Spouse name: Not on file   Number of children: 2   Years of education: Not on file   Highest education level: Not on file  Occupational History   Occupation: Retired    Associate Professor: [employer]    Comment: truck Air traffic controller: [employer]  Tobacco Use   Smoking status: Former    Current packs/day: 0.00    Average packs/day: 1 pack/day for 57.0 years (57.0 ttl pk-yrs)    Types: Cigarettes, Cigars    Start date: 08/07/1958    Quit date: 08/08/2015    Years since quitting: 7.9   Smokeless  tobacco: Former    Types: Chew    Quit date: 11/04/1958   Tobacco comments:    Will smoke cigar every once in awhile.  Vaping daily started a couple weeks ago.  04/18/22 hfb  Vaping Use   Vaping status: Some Days  Substance and Sexual Activity   Alcohol use: Not Currently    Alcohol/week: 0.0 standard drinks of alcohol   Drug use: No   Sexual activity: Not Currently  Other Topics Concern   Not on file  Social History Narrative   Not on file   Social Determinants of Health   Financial Resource Strain: Low Risk  (05/22/2023)   Overall Financial Resource Strain (CARDIA)    Difficulty of Paying  Living Expenses: Not very hard  Food Insecurity: No Food Insecurity (05/19/2023)   Hunger Vital Sign    Worried About Running Out of Food in the Last Year: Never true    Ran Out of Food in the Last Year: Never true  Transportation Needs: No Transportation Needs (05/19/2023)   PRAPARE - Administrator, Civil Service (Medical): No    Lack of Transportation (Non-Medical): No  Physical Activity: Insufficiently Active (08/05/2021)   Exercise Vital Sign    Days of Exercise per Week: 2 days    Minutes of Exercise per Session: 40 min  Stress: Stress Concern Present (05/22/2023)   [employer] of Occupational Health - Occupational Stress Questionnaire    Feeling of Stress : Rather much  Social Connections: Moderately Isolated (05/13/2022)   Social Connection and Isolation Panel [NHANES]    Frequency of Communication with Friends and Family: Twice a week    Frequency of Social Gatherings with Friends and Family: Once a week    Attends Religious Services: Never    Database administrator or Organizations: No    Attends Banker Meetings: Never    Marital Status: Married  Catering manager Violence: Not At Risk (04/22/2023)   Humiliation, Afraid, Rape, and Kick questionnaire    Fear of Current or Ex-Partner: No    Emotionally Abused: No    Physically Abused: No    Sexually  Abused: No    Outpatient Medications Prior to Visit  Medication Sig Dispense Refill   acetaminophen (TYLENOL) 500 MG tablet Take 1-2 tablets (500-1,000 mg total) by mouth every 6 (six) hours as needed. (Patient taking differently: Take 1,000 mg by mouth daily as needed for headache, fever or moderate pain.) 30 tablet 0   albuterol (PROVENTIL) (2.5 MG/3ML) 0.083% nebulizer solution Take 3 mLs (2.5 mg total) by nebulization every 6 (six) hours as needed for wheezing or shortness of breath. 75 mL 12   amiodarone (PACERONE) 200 MG tablet Take 1/2 tablet (100mg ) by mouth three days per week (M,W,F) (Patient taking differently: Take 100 mg by mouth every Monday, Wednesday, and Friday.) 6 tablet 3   amoxicillin-clavulanate (AUGMENTIN) 875-125 MG tablet Take 1 tablet by mouth every 12 (twelve) hours. 14 tablet 0   ASCORBIC ACID PO Take 1 tablet by mouth daily. Vitamin C, unknown strength.     atorvastatin (LIPITOR) 80 MG tablet Take 1 tablet (80 mg total) by mouth daily. 90 tablet 2   Budeson-Glycopyrrol-Formoterol (BREZTRI AEROSPHERE) 160-9-4.8 MCG/ACT AERO Inhale 2 puffs into the lungs in the morning and at bedtime. (Patient taking differently: Inhale 2 puffs into the lungs 2 (two) times daily as needed (wheezing, shortness of breath).) 10.7 g 5   fluticasone (FLONASE) 50 MCG/ACT nasal spray Place 2 sprays into both nostrils daily. 16 g 1   furosemide (LASIX) 40 MG tablet Take 1 tablet (40 mg total) by mouth daily. 90 tablet 0   glipiZIDE (GLUCOTROL XL) 2.5 MG 24 hr tablet Take 1 tablet (2.5 mg total) by mouth daily with breakfast. 90 tablet 0   omeprazole (PRILOSEC) 40 MG capsule Take 1 capsule (40 mg total) by mouth daily. 90 capsule 0   potassium chloride (KLOR-CON) 10 MEQ tablet Take 1 tablet (10 mEq total) by mouth daily. 90 tablet 3   predniSONE (DELTASONE) 10 MG tablet Take 4 tablets (40 mg total) by mouth daily for 2 days, THEN 3 tablets (30 mg total) daily for 2 days, THEN 2 tablets (20 mg total)  daily for 2 days, THEN 1 tablet (10 mg total) daily for 2 days. 20 tablet 0   pregabalin (LYRICA) 25 MG capsule Take 1 capsule (25 mg total) by mouth 2 (two) times daily. 60 capsule 0   rivaroxaban (XARELTO) 20 MG TABS tablet Take 1 tablet (20 mg total) by mouth every evening with supper. 90 tablet 1   rivaroxaban (XARELTO) 20 MG TABS tablet Take 1 tablet (20 mg total) by mouth every evening with supper.     spironolactone (ALDACTONE) 25 MG tablet Take 1 tablet (25 mg total) by mouth daily. 90 tablet 1   tamsulosin (FLOMAX) 0.4 MG CAPS capsule Take 1 capsule (0.4 mg total) by mouth daily. 90 capsule 1   No facility-administered medications prior to visit.    Allergies  Allergen Reactions   Iodine Swelling    Neck, gland swelling   Iohexol Swelling    Neck, gland swelling   Metformin And Related Diarrhea    ROS    See HPI Objective:    Physical Exam Constitutional:      General: He is not in acute distress.    Appearance: He is well-developed.  HENT:     Head: Normocephalic and atraumatic.  Cardiovascular:     Rate and Rhythm: Tachycardia present. Rhythm irregular.     Heart sounds: No murmur heard. Pulmonary:     Effort: Pulmonary effort is normal. No respiratory distress.     Breath sounds: Wheezing present. No rales.     Comments: Bilateral expiratory wheeze L>R, decreased breath sounds at left lung base.  Abdominal:     General: Bowel sounds are normal.     Palpations: Abdomen is soft.     Comments: Mild LLQ pain. No guarding  Skin:    General: Skin is warm and dry.  Neurological:     Mental Status: He is alert and oriented to person, place, and time.  Psychiatric:        Behavior: Behavior normal.        Thought Content: Thought content normal.      BP (!) 120/100 (BP Location: Left Arm, Patient Position: Sitting, Cuff Size: Large)   Pulse (!) 105   Temp 98.4 F (36.9 C) (Oral)   Resp 16   Wt 178 lb (80.7 kg)   SpO2 99%   BMI 26.29 kg/m  Wt Readings  from Last 3 Encounters:  08/01/23 177 lb (80.3 kg)  07/29/23 178 lb (80.7 kg)  07/25/23 174 lb (78.9 kg)       Assessment & Plan:   Problem List Items Addressed This Visit       Unprioritized   Pleural effusion    Chronic. Clinically unchanged.  Monitor.       Diverticulitis    Clinically improving.  Complete augmentin.        Relevant Orders   CBC w/Diff (Completed)   Cramping of hands     New onset of bilateral hand cramping, described as similar to a charley horse. No associated neck pain or recent heavy lifting. -Observe for now, check electrolytes, no immediate intervention planned.      COPD exacerbation (HCC)    Pt saw Pulmonology on 9/20 and was placed on prednisone taper for COPD exacerbation.        Chronic atrial fibrillation with RVR (HCC) - Primary    Pt reports good compliance with amiodarone. He is anticoagulated with xarelto.  EKG is performed and personally reviewed, notes AF with rate 127 bpm.  SOB is at baseline per patient. Clinically euvolemic.   -Add diltiazem CD 120mg  daily to improve rate control. -Continue amiodarone/xarelto. -Notify cardiologist of change. -Return visit on Friday 08/01/2023 for follow-up and labs. -If increased shortness of breath or palpitations, patient to go to ER.      Relevant Medications   diltiazem (CARDIZEM LA) 120 MG 24 hr tablet   Other Relevant Orders   EKG 12-Lead (Completed)   Basic Metabolic Panel (BMET) (Completed)   Magnesium (Completed)   General Health Maintenance -Complete necessary paperwork for patient's handicap parking permit. -Order labs for today's visit.  I am having [name] maintain his acetaminophen, Breztri Aerosphere, albuterol, fluticasone, amiodarone, atorvastatin, ASCORBIC ACID PO, potassium chloride, omeprazole, spironolactone, tamsulosin, furosemide, rivaroxaban, pregabalin, rivaroxaban, glipiZIDE, amoxicillin-clavulanate, predniSONE, and diltiazem.  Meds ordered this encounter   Medications   DISCONTD: diltiazem (CARDIZEM LA) 120 MG 24 hr tablet    Sig: Take 1 tablet (120 mg total) by mouth daily.    Dispense:  30 tablet    Refill:  2    Order Specific Question:   Supervising Provider    Answer:   Danise Edge A [[id number]]   diltiazem (CARDIZEM LA) 120 MG 24 hr tablet    Sig: Take 1 tablet (120 mg total) by mouth daily.    Dispense:  30 tablet    Refill:  0    Please call patient as soon as ready, thanks

## 2023-07-30 ENCOUNTER — Telehealth: Payer: Self-pay | Admitting: Family

## 2023-07-30 ENCOUNTER — Other Ambulatory Visit (HOSPITAL_BASED_OUTPATIENT_CLINIC_OR_DEPARTMENT_OTHER): Payer: Self-pay

## 2023-07-30 NOTE — Telephone Encounter (Signed)
Patient reports pharmacy did not have his diltiazem. Called [facility] and they report medication is ready for pick up.  Patient was notified

## 2023-07-30 NOTE — Telephone Encounter (Signed)
Patient states he only has about 6 tablets of Xarelto 20mg . He states he won't have the funds to pay for a month supply. We have applied for medication assistance program for Xarelto and we are awaiting their decision after providing letter of hardship.   We did not have any samples of Xarelto 20mg  but we did have Xarelto 10mg  - provided #14. Patient is aware to take 2 tabs of the 10mg  xarelto to equal 20mg  per day.  Xarelto 10mg  #14 - Lot: [id number] / Exp 11/2023

## 2023-07-30 NOTE — Telephone Encounter (Signed)
Pt called stating that he wanted to speak to Tammy regarding some blood thinners that he's on. Advised that a note would be sent back to have her give him a cal back.

## 2023-07-30 NOTE — Telephone Encounter (Signed)
Called [employer] patient assistance program to check on patient's application. Representative states his application has been referred over to the [place] with Me Program. I expained that patient is not able to afford the cost of Xarelto even with the [employer] with Me Program cost of $85/30 days or $240 for 90 days.  Representative states they need a new letter of financial hardship that includes a statement that patient patient cannot afford Xarelto with Me price.  Letter created and left for PCP to review and sign.   Once signed, can be faxed to [phone]

## 2023-07-30 NOTE — Telephone Encounter (Signed)
Pt called to advise [facility] did not have his diazepam prescription in stock. He said he called [facility] twice and they do not have it either. Pt would like a call back from Windell Moulding to discuss.

## 2023-07-30 NOTE — Telephone Encounter (Signed)
Unable to reach patient. LM on VM to call back

## 2023-07-31 NOTE — Telephone Encounter (Signed)
Pt said he received a call to call before he goes somewhere. Advised that his medication was ready for pick up. Pt said he picked it up and took one already. Pt also requesting red cherry cough syrup that Melissa has called in before. He said he will talk to her tomorrow if she didn't get a chance to send in. Advised she may wait until she speaks with him at his appt. Pt stated okay.

## 2023-08-01 ENCOUNTER — Ambulatory Visit (INDEPENDENT_AMBULATORY_CARE_PROVIDER_SITE_OTHER): Payer: Medicare Other | Admitting: Family

## 2023-08-01 ENCOUNTER — Other Ambulatory Visit (HOSPITAL_BASED_OUTPATIENT_CLINIC_OR_DEPARTMENT_OTHER): Payer: Self-pay

## 2023-08-01 ENCOUNTER — Encounter: Payer: Self-pay | Admitting: Family

## 2023-08-01 VITALS — BP 118/52 | HR 82 | Temp 97.4°F | Resp 16 | Wt 177.0 lb

## 2023-08-01 DIAGNOSIS — I5033 Acute on chronic diastolic (congestive) heart failure: Secondary | ICD-10-CM

## 2023-08-01 DIAGNOSIS — R1032 Left lower quadrant pain: Secondary | ICD-10-CM | POA: Diagnosis not present

## 2023-08-01 DIAGNOSIS — J449 Chronic obstructive pulmonary disease, unspecified: Secondary | ICD-10-CM

## 2023-08-01 DIAGNOSIS — I482 Chronic atrial fibrillation, unspecified: Secondary | ICD-10-CM

## 2023-08-01 MED ORDER — BENZONATATE 100 MG PO CAPS
100.0000 mg | ORAL_CAPSULE | Freq: Three times a day (TID) | ORAL | 0 refills | Status: DC | PRN
  Filled 2023-08-01: qty 20, 7d supply, fill #0

## 2023-08-01 NOTE — Assessment & Plan Note (Signed)
Resolved following treatment for diverticulitis.

## 2023-08-01 NOTE — Assessment & Plan Note (Signed)
Completing treatment for flare with prednisone. He is also on Breztri. I think his SOB is multfactorial- CHF, COPD, Pleural effusion (noted to be smaller on x ray last week).  Will continue to work on each of these.  Continue pulmonary follow up.

## 2023-08-01 NOTE — Patient Instructions (Signed)
VISIT SUMMARY:  During your visit, we discussed your ongoing issues with swelling in your ankles and feet, difficulty breathing, and a persistent cough. We also talked about your concerns with your current medication and your request for a refill of your cough suppressant syrup.  YOUR PLAN:  -SWELLING AND DIFFICULTY BREATHING: These symptoms are due to a condition called Congestive Heart Failure, where your heart doesn't pump blood as well as it should. We will increase your Furosemide medication to 40mg  in the morning and 20mg  in the evening for three days, then return to 40mg  daily. This should help reduce the swelling and improve your breathing.  -PERSISTENT COUGH: We will identify and prescribe the cough syrup that you found effective in the past.  -GENERAL HEALTH MAINTENANCE: Continue taking your Diltiazem medication for blood pressure and heart rate control. Also, continue with your other prescribed medications. We will reevaluate your condition in three days to see how you're responding to the increased Furosemide dosage.  INSTRUCTIONS:  Please make sure to take your increased Furosemide dosage as instructed for the next three days, then return to the regular 40mg  daily dosage. Continue taking your Diltiazem and other prescribed medications. We will check on your progress in three days.

## 2023-08-01 NOTE — Assessment & Plan Note (Signed)
He appears volume overloaded. Continue aldactone, increase furosemide to 40mg  AM and 20mg  PM x 3 days, then return to 40mg  once daily in AM.

## 2023-08-01 NOTE — Progress Notes (Signed)
Subjective:     Patient ID: Robert Bates, male    DOB: 1940/11/14, 82 y.o.   MRN: [id number]  Chief Complaint  Patient presents with   Follow-up    HPI  Discussed the use of AI scribe software for clinical note transcription with the patient, who gave verbal consent to proceed.  Last visit HR and blood pressure were elevated.  Patient was started on diltiazem CD to help with both issues.   He saw pulmonology on 9/20 who prescribed a steroid taper for COPD exacerbation.  He is on his last day. The patient presents with persistent swelling in his ankles and feet despite taking diuretics (lasix and aldactone). He also reports difficulty breathing, particularly when attempting to walk or lift objects. They describe their breathing as "not good" and express frustration with their limited physical capabilities.In addition to these issues, the patient has been experiencing mucus build-up in their chest, which they find distressing. They have tried a mucus relief medication but found it intolerable.     He reports resolution of his diverticulitis related abdominal pain.   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   COPD (chronic obstructive pulmonary disease) (HCC)     Coronary artery disease    Diabetes type 2, controlled (HCC) 07/31/2017   Diverticulitis 07/29/2023   Dyspnea    Dysrhythmia    a fib   GERD (gastroesophageal reflux disease)    Hematuria    refuses work up or referral - understands risks of morbidity / mortality - 11/2008, 12/2008   Heme positive stool    History of hiatal hernia    History of kidney stones    Hyperlipemia    Meningioma (HCC) 10/25/2013   Follows with Dr. Coletta Memos.    Peripheral vascular disease (HCC)    Abdominal Aortic Aneursym   Pneumonia    as a child   Radiation 09/18/15-10/25/15   left lower lobe 70.2 Gy   Seizures (HCC) 02/18/2020   Tobacco abuse     Past Surgical History:  Procedure Laterality Date   CHOLECYSTECTOMY N/A 07/23/2017   Procedure: LAPAROSCOPIC CHOLECYSTECTOMY;  Surgeon: Rodman Pickle, MD;  Location: [facility];  Service: General;  Laterality: N/A;   CLIPPING OF ATRIAL APPENDAGE Left 05/08/2021   Procedure: CLIPPING OF ATRIAL APPENDAGE USING 45 ATRICLIP;  Surgeon: Linden Dolin, MD;  Location: [facility];  Service: Open Heart Surgery;  Laterality: Left;   COLONOSCOPY     CORONARY ARTERY BYPASS GRAFT N/A 05/08/2021   Procedure: CORONARY ARTERY BYPASS GRAFTING (CABG)X 3 USING LEFT INTERNAL MAMMARY ARTERY AND RIGHT GREATER SAPEHNOUS VEIN;  Surgeon: Linden Dolin, MD;  Location:  [facility];  Service: Open Heart Surgery;  Laterality: N/A;   ENDOVEIN HARVEST OF GREATER SAPHENOUS VEIN Right 05/08/2021   Procedure: ENDOVEIN HARVEST OF GREATER SAPHENOUS VEIN;  Surgeon: Linden Dolin, MD;  Location: [facility];  Service: Open Heart Surgery;  Laterality: Right;   EYE SURGERY Bilateral    Cataracts removed w/ lens implant   HERNIA REPAIR     Left 36 years ago . Right inguinal hernia repair 10-01-17 Dr. Sheliah Hatch   INGUINAL HERNIA REPAIR Right 10/01/2017   Procedure: RIGHT INGUINAL HERNIA REPAIR WITH MESH;  Surgeon: Kinsinger, De Blanch, MD;  Location: [facility];  Service: General;  Laterality: Right;  TAP  BLOCK   INSERTION OF MESH Right 10/01/2017   Procedure: INSERTION OF MESH;  Surgeon: Sheliah Hatch De Blanch, MD;  Location: [facility];  Service: General;  Laterality: Right;   IR THORACENTESIS ASP PLEURAL SPACE W/IMG GUIDE  05/18/2021   IR THORACENTESIS ASP PLEURAL SPACE W/IMG GUIDE  06/07/2021   LEFT HEART CATH AND CORONARY ANGIOGRAPHY N/A 04/20/2021   Procedure: LEFT HEART CATH AND CORONARY ANGIOGRAPHY;  Surgeon: Iran Ouch, MD;  Location: [facility];  Service: Cardiovascular;  Laterality: N/A;   TEE WITHOUT CARDIOVERSION N/A 05/08/2021   Procedure: TRANSESOPHAGEAL ECHOCARDIOGRAM (TEE);  Surgeon: Linden Dolin, MD;  Location: [facility] OR;  Service: Open Heart Surgery;  Laterality: N/A;   TONSILLECTOMY     TONSILLECTOMY     VIDEO BRONCHOSCOPY Bilateral 07/26/2015   Procedure: VIDEO BRONCHOSCOPY WITH FLUORO;  Surgeon: Nyoka Cowden, MD;  Location: [facility];  Service: Cardiopulmonary;  Laterality: Bilateral;   VIDEO BRONCHOSCOPY WITH ENDOBRONCHIAL NAVIGATION N/A 08/23/2015   Procedure: VIDEO BRONCHOSCOPY WITH ENDOBRONCHIAL NAVIGATION;  Surgeon: Delight Ovens, MD;  Location: [facility];  Service: Thoracic;  Laterality: N/A;   VIDEO BRONCHOSCOPY WITH ENDOBRONCHIAL ULTRASOUND N/A 08/23/2015   Procedure: VIDEO BRONCHOSCOPY WITH ENDOBRONCHIAL ULTRASOUND;  Surgeon: Delight Ovens, MD;  Location: [facility];  Service: Thoracic;  Laterality: N/A;    Family History  Problem Relation Age of Onset   Leukemia Father    Emphysema Father    Learning disabilities Son    Atrial fibrillation Son    Leukemia Other    Stroke Other     Social History   Socioeconomic History   Marital status: Widowed    Spouse name: Not on file   Number of children: 2   Years of education: Not on file   Highest education level: Not on file  Occupational History   Occupation: Retired    Associate Professor: [employer]    Comment: truck Air traffic controller: [employer]  Tobacco Use   Smoking status: Former    Current  packs/day: 0.00    Average packs/day: 1 pack/day for 57.0 years (57.0 ttl pk-yrs)    Types: Cigarettes, Cigars    Start date: 08/07/1958    Quit date: 08/08/2015    Years since quitting: 7.9   Smokeless tobacco: Former    Types: Chew    Quit date: 11/04/1958   Tobacco comments:    Will smoke cigar every once in awhile.  Vaping daily started a couple weeks ago.  04/18/22 hfb  Vaping Use   Vaping status: Some Days  Substance and Sexual Activity   Alcohol use: Not Currently    Alcohol/week: 0.0 standard drinks of alcohol   Drug use: No   Sexual activity: Not Currently  Other Topics Concern   Not on file  Social History Narrative  Not on file   Social Determinants of Health   Financial Resource Strain: Low Risk  (05/22/2023)   Overall Financial Resource Strain (CARDIA)    Difficulty of Paying Living Expenses: Not very hard  Food Insecurity: No Food Insecurity (05/19/2023)   Hunger Vital Sign    Worried About Running Out of Food in the Last Year: Never true    Ran Out of Food in the Last Year: Never true  Transportation Needs: No Transportation Needs (05/19/2023)   PRAPARE - Administrator, Civil Service (Medical): No    Lack of Transportation (Non-Medical): No  Physical Activity: Insufficiently Active (08/05/2021)   Exercise Vital Sign    Days of Exercise per Week: 2 days    Minutes of Exercise per Session: 40 min  Stress: Stress Concern Present (05/22/2023)   [employer] of Occupational Health - Occupational Stress Questionnaire    Feeling of Stress : Rather much  Social Connections: Moderately Isolated (05/13/2022)   Social Connection and Isolation Panel [NHANES]    Frequency of Communication with Friends and Family: Twice a week    Frequency of Social Gatherings with Friends and Family: Once a week    Attends Religious Services: Never    Database administrator or Organizations: No    Attends Banker Meetings: Never    Marital Status: Married   Catering manager Violence: Not At Risk (04/22/2023)   Humiliation, Afraid, Rape, and Kick questionnaire    Fear of Current or Ex-Partner: No    Emotionally Abused: No    Physically Abused: No    Sexually Abused: No    Outpatient Medications Prior to Visit  Medication Sig Dispense Refill   acetaminophen (TYLENOL) 500 MG tablet Take 1-2 tablets (500-1,000 mg total) by mouth every 6 (six) hours as needed. (Patient taking differently: Take 1,000 mg by mouth daily as needed for headache, fever or moderate pain.) 30 tablet 0   albuterol (PROVENTIL) (2.5 MG/3ML) 0.083% nebulizer solution Take 3 mLs (2.5 mg total) by nebulization every 6 (six) hours as needed for wheezing or shortness of breath. 75 mL 12   amiodarone (PACERONE) 200 MG tablet Take 1/2 tablet (100mg ) by mouth three days per week (M,W,F) (Patient taking differently: Take 100 mg by mouth every Monday, Wednesday, and Friday.) 6 tablet 3   amoxicillin-clavulanate (AUGMENTIN) 875-125 MG tablet Take 1 tablet by mouth every 12 (twelve) hours. 14 tablet 0   ASCORBIC ACID PO Take 1 tablet by mouth daily. Vitamin C, unknown strength.     atorvastatin (LIPITOR) 80 MG tablet Take 1 tablet (80 mg total) by mouth daily. 90 tablet 2   Budeson-Glycopyrrol-Formoterol (BREZTRI AEROSPHERE) 160-9-4.8 MCG/ACT AERO Inhale 2 puffs into the lungs in the morning and at bedtime. (Patient taking differently: Inhale 2 puffs into the lungs 2 (two) times daily as needed (wheezing, shortness of breath).) 10.7 g 5   diltiazem (CARDIZEM LA) 120 MG 24 hr tablet Take 1 tablet (120 mg total) by mouth daily. 30 tablet 0   fluticasone (FLONASE) 50 MCG/ACT nasal spray Place 2 sprays into both nostrils daily. 16 g 1   furosemide (LASIX) 40 MG tablet Take 1 tablet (40 mg total) by mouth daily. 90 tablet 0   glipiZIDE (GLUCOTROL XL) 2.5 MG 24 hr tablet Take 1 tablet (2.5 mg total) by mouth daily with breakfast. 90 tablet 0   omeprazole (PRILOSEC) 40 MG capsule Take 1 capsule (40  mg total) by mouth daily. 90 capsule 0  potassium chloride (KLOR-CON) 10 MEQ tablet Take 1 tablet (10 mEq total) by mouth daily. 90 tablet 3   predniSONE (DELTASONE) 10 MG tablet Take 4 tablets (40 mg total) by mouth daily for 2 days, THEN 3 tablets (30 mg total) daily for 2 days, THEN 2 tablets (20 mg total) daily for 2 days, THEN 1 tablet (10 mg total) daily for 2 days. 20 tablet 0   pregabalin (LYRICA) 25 MG capsule Take 1 capsule (25 mg total) by mouth 2 (two) times daily. 60 capsule 0   rivaroxaban (XARELTO) 20 MG TABS tablet Take 1 tablet (20 mg total) by mouth every evening with supper. 90 tablet 1   rivaroxaban (XARELTO) 20 MG TABS tablet Take 1 tablet (20 mg total) by mouth every evening with supper.     spironolactone (ALDACTONE) 25 MG tablet Take 1 tablet (25 mg total) by mouth daily. 90 tablet 1   tamsulosin (FLOMAX) 0.4 MG CAPS capsule Take 1 capsule (0.4 mg total) by mouth daily. 90 capsule 1   No facility-administered medications prior to visit.    Allergies  Allergen Reactions   Iodine Swelling    Neck, gland swelling   Iohexol Swelling    Neck, gland swelling   Metformin And Related Diarrhea    ROS See HPI    Objective:    Physical Exam Constitutional:      General: He is not in acute distress.    Appearance: He is well-developed.  HENT:     Head: Normocephalic and atraumatic.  Cardiovascular:     Rate and Rhythm: Normal rate and regular rhythm.     Heart sounds: No murmur heard. Pulmonary:     Effort: No respiratory distress.     Breath sounds: Wheezing present. No rales.     Comments: Appears moderately short of breath Abdominal:     General: Bowel sounds are normal. There is distension.     Comments: Mild abdominal distension- I suspect he may be retaining some fluid in his abdomen. Non-tender.   Musculoskeletal:     Right lower leg: 3+ Edema present.     Left lower leg: 3+ Edema present.  Skin:    General: Skin is warm and dry.  Neurological:      Mental Status: He is alert and oriented to person, place, and time.  Psychiatric:        Behavior: Behavior normal.        Thought Content: Thought content normal.      BP (!) 118/52 (BP Location: Right Arm, Patient Position: Sitting, Cuff Size: Small)   Pulse 82   Temp (!) 97.4 F (36.3 C) (Oral)   Resp 16   Wt 177 lb (80.3 kg)   SpO2 96%   BMI 26.14 kg/m  Wt Readings from Last 3 Encounters:  08/01/23 177 lb (80.3 kg)  07/29/23 178 lb (80.7 kg)  07/25/23 174 lb (78.9 kg)       Assessment & Plan:   Problem List Items Addressed This Visit       Unprioritized   COPD GOLD II  - Primary    Completing treatment for flare with prednisone. He is also on Breztri. I think his SOB is multfactorial- CHF, COPD, Pleural effusion (noted to be smaller on x ray last week).  Will continue to work on each of these.  Continue pulmonary follow up.       Relevant Medications   benzonatate (TESSALON) 100 MG capsule   Chronic atrial fibrillation with  RVR (HCC)    Rate has improved. Continue diltiazem CD and keep upcoming follow up visit with cardiology.       Acute on chronic diastolic heart failure (HCC)    He appears volume overloaded. Continue aldactone, increase furosemide to 40mg  AM and 20mg  PM x 3 days, then return to 40mg  once daily in AM.      Abdominal pain, acute, left lower quadrant    Resolved following treatment for diverticulitis.        I am having [name] start on benzonatate. I am also having him maintain his acetaminophen, Breztri Aerosphere, albuterol, fluticasone, amiodarone, atorvastatin, ASCORBIC ACID PO, potassium chloride, omeprazole, spironolactone, tamsulosin, furosemide, rivaroxaban, pregabalin, rivaroxaban, glipiZIDE, amoxicillin-clavulanate, predniSONE, and diltiazem.  Meds ordered this encounter  Medications   benzonatate (TESSALON) 100 MG capsule    Sig: Take 1 capsule (100 mg total) by mouth 3 (three) times daily as needed.    Dispense:  20  capsule    Refill:  0    Order Specific Question:   Supervising Provider    Answer:   Danise Edge A [[id number]]

## 2023-08-01 NOTE — Assessment & Plan Note (Signed)
Rate has improved. Continue diltiazem CD and keep upcoming follow up visit with cardiology.

## 2023-08-03 ENCOUNTER — Encounter: Payer: Self-pay | Admitting: Cardiovascular Disease

## 2023-08-03 NOTE — Assessment & Plan Note (Signed)
-    CXR today showed left pleural effusion has decreased in size since previous study

## 2023-08-03 NOTE — Progress Notes (Unsigned)
Cardiology Office Note:    Date:  08/04/2023   ID:  Robert Bates, DOB 1941/04/17, MRN [id number]  PCP:  Sandford Craze, NP  Cardiologist:  Kristeen Miss, MD    Referring MD: Sandford Craze, NP   Chief Complaint  Patient presents with   Coronary Artery Disease        Hypertension        Atrial Fibrillation        Congestive Heart Failure         Previous notes:     Robert Bates is a 82 y.o. male with a hx of  PAF. He has documented PAF on monitor Cannot tell that he is in AF  I met him in the hospital  He has a history of tobacco abuse, alcoholism, lung cancer, COPD, peripheral vascular disease, abdominal aortic aneurysm, hypertension   Nov, 5, 2021: Robert Bates is seen today for follow of his PAF, tobacco abuse, alcoholism, lung CA, COPD, AAA HTN Still working , truck driving   On xarelto 20 mg a day  As a thoracic aneurism followed by dr. Gwenyth Bouillon    Dec. 14, 2022: Robert Bates is seen today for worsening shortness of breath  Hx of cigarette smoking, lung CA, COPD, AAA , HTN  He has had more dyspnea recently  Also some atypical CP and DOE   Has PAF - is on Xarelto  Has lots of DOE and wheezing .  Still smoking  Uses inhalers   Had CABG July 5, with L atrial clipping   Had post op Atrial fib   Dec. 4, 2023 Robert Bates is seen for follow up of his shortness of breath, AAA,COPD,HTN  Still driving turck Quick smoking ,  vapes occasion  No cp, Not able to exercise .   Will check lipids , ALT , BMP today    Sept. 30, 2024 Robert Bates is seen for recurrence of his atrial fib He is more short of breath today   EKG shows atrial flutter with 2-1 AV block with a heart rate of 172.  Is very fatigued. 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   COPD (chronic obstructive pulmonary disease) (HCC)    Coronary artery disease    Diabetes type 2, controlled (HCC) 07/31/2017   Diverticulitis 07/29/2023   Dyspnea    Dysrhythmia    a fib   GERD (gastroesophageal reflux disease)    Hematuria    refuses work up or referral - understands risks of morbidity / mortality - 11/2008, 12/2008   Heme positive stool    History of hiatal hernia    History of kidney stones    Hyperlipemia    Meningioma (HCC) 10/25/2013   Follows with Dr. Coletta Memos.    Peripheral vascular disease (HCC)    Abdominal Aortic Aneursym   Pneumonia    as a child   Radiation 09/18/15-10/25/15   left lower lobe 70.2 Gy   Seizures (  HCC) 02/18/2020   Tobacco abuse     Past Surgical History:  Procedure Laterality Date   CHOLECYSTECTOMY N/A 07/23/2017   Procedure: LAPAROSCOPIC CHOLECYSTECTOMY;  Surgeon: Kinsinger, De Blanch, MD;  Location: [facility];  Service: General;  Laterality: N/A;   CLIPPING OF ATRIAL APPENDAGE Left 05/08/2021   Procedure: CLIPPING OF ATRIAL APPENDAGE USING 45 ATRICLIP;  Surgeon: Linden Dolin, MD;  Location: [facility];  Service: Open Heart Surgery;  Laterality: Left;   COLONOSCOPY     CORONARY ARTERY BYPASS GRAFT N/A 05/08/2021   Procedure: CORONARY ARTERY BYPASS GRAFTING (CABG)X 3 USING LEFT INTERNAL MAMMARY ARTERY AND RIGHT GREATER SAPEHNOUS VEIN;  Surgeon: Linden Dolin, MD;  Location: [facility];  Service: Open Heart Surgery;  Laterality: N/A;   ENDOVEIN HARVEST OF GREATER SAPHENOUS VEIN Right 05/08/2021   Procedure: ENDOVEIN HARVEST OF GREATER SAPHENOUS VEIN;  Surgeon: Linden Dolin, MD;  Location: [facility];  Service: Open Heart Surgery;  Laterality: Right;   EYE SURGERY Bilateral     Cataracts removed w/ lens implant   HERNIA REPAIR     Left 36 years ago . Right inguinal hernia repair 10-01-17 Dr. Sheliah Hatch   INGUINAL HERNIA REPAIR Right 10/01/2017   Procedure: RIGHT INGUINAL HERNIA REPAIR WITH MESH;  Surgeon: Kinsinger, De Blanch, MD;  Location: [facility];  Service: General;  Laterality: Right;  TAP BLOCK   INSERTION OF MESH Right 10/01/2017   Procedure: INSERTION OF MESH;  Surgeon: Sheliah Hatch De Blanch, MD;  Location: [facility];  Service: General;  Laterality: Right;   IR THORACENTESIS ASP PLEURAL SPACE W/IMG GUIDE  05/18/2021   IR THORACENTESIS ASP PLEURAL SPACE W/IMG GUIDE  06/07/2021   LEFT HEART CATH AND CORONARY ANGIOGRAPHY N/A 04/20/2021   Procedure: LEFT HEART CATH AND CORONARY ANGIOGRAPHY;  Surgeon: Iran Ouch, MD;  [facility]: [facility];  Service: Cardiovascular;  Laterality: N/A;   TEE WITHOUT CARDIOVERSION N/A 05/08/2021   Procedure: TRANSESOPHAGEAL ECHOCARDIOGRAM (TEE);  Surgeon: Linden Dolin, MD;  Location: [facility];  Service: Open Heart Surgery;  Laterality: N/A;   TONSILLECTOMY     TONSILLECTOMY     VIDEO BRONCHOSCOPY Bilateral 07/26/2015   Procedure: VIDEO BRONCHOSCOPY WITH FLUORO;  Surgeon: Nyoka Cowden, MD;  Location: [facility];  Service: Cardiopulmonary;  Laterality: Bilateral;   VIDEO BRONCHOSCOPY WITH ENDOBRONCHIAL NAVIGATION N/A 08/23/2015   Procedure: VIDEO BRONCHOSCOPY WITH ENDOBRONCHIAL NAVIGATION;  Surgeon: Delight Ovens, MD;  Location: [facility];  Service: Thoracic;  Laterality: N/A;   VIDEO BRONCHOSCOPY WITH ENDOBRONCHIAL ULTRASOUND N/A 08/23/2015   Procedure: VIDEO BRONCHOSCOPY WITH ENDOBRONCHIAL ULTRASOUND;  Surgeon: Delight Ovens, MD;  Location: [facility];  Service: Thoracic;  Laterality: N/A;    Current Medications: Current Meds  Medication Sig   acetaminophen (TYLENOL) 500 MG tablet Take 1-2 tablets (500-1,000 mg total) by mouth every 6 (six) hours as needed. (Patient taking differently: Take 1,000 mg by mouth daily as needed  for headache, fever or moderate pain.)   albuterol (PROVENTIL) (2.5 MG/3ML) 0.083% nebulizer solution Take 3 mLs (2.5 mg total) by nebulization every 6 (six) hours as needed for wheezing or shortness of breath.   amiodarone (PACERONE) 200 MG tablet Take 1/2 tablet (100mg ) by mouth three days per week (M,W,F) (Patient taking differently: Take 100 mg by mouth every Monday, Wednesday, and Friday.)   amoxicillin-clavulanate (AUGMENTIN) 875-125 MG tablet Take 1 tablet by mouth every 12 (twelve) hours.   ASCORBIC ACID PO Take 1 tablet by mouth daily. Vitamin C, unknown strength.  atorvastatin (LIPITOR) 80 MG tablet Take 1 tablet (80 mg total) by mouth daily.   benzonatate (TESSALON) 100 MG capsule Take 1 capsule (100 mg total) by mouth 3 (three) times daily as needed.   Budeson-Glycopyrrol-Formoterol (BREZTRI AEROSPHERE) 160-9-4.8 MCG/ACT AERO Inhale 2 puffs into the lungs in the morning and at bedtime. (Patient taking differently: Inhale 2 puffs into the lungs 2 (two) times daily as needed (wheezing, shortness of breath).)   diltiazem (CARDIZEM LA) 120 MG 24 hr tablet Take 1 tablet (120 mg total) by mouth daily.   fluticasone (FLONASE) 50 MCG/ACT nasal spray Place 2 sprays into both nostrils daily.   furosemide (LASIX) 40 MG tablet Take 1 tablet (40 mg total) by mouth daily.   glipiZIDE (GLUCOTROL XL) 2.5 MG 24 hr tablet Take 1 tablet (2.5 mg total) by mouth daily with breakfast.   omeprazole (PRILOSEC) 40 MG capsule Take 1 capsule (40 mg total) by mouth daily.   potassium chloride (KLOR-CON) 10 MEQ tablet Take 1 tablet (10 mEq total) by mouth daily.   pregabalin (LYRICA) 25 MG capsule Take 1 capsule (25 mg total) by mouth 2 (two) times daily.   rivaroxaban (XARELTO) 20 MG TABS tablet Take 1 tablet (20 mg total) by mouth every evening with supper.   rivaroxaban (XARELTO) 20 MG TABS tablet Take 1 tablet (20 mg total) by mouth every evening with supper.   spironolactone (ALDACTONE) 25 MG tablet Take 1  tablet (25 mg total) by mouth daily.   tamsulosin (FLOMAX) 0.4 MG CAPS capsule Take 1 capsule (0.4 mg total) by mouth daily.     Allergies:   Iodine, Iohexol, and Metformin and related   Social History   Socioeconomic History   Marital status: Widowed    Spouse name: Not on file   Number of children: 2   Years of education: Not on file   Highest education level: Not on file  Occupational History   Occupation: Retired    Associate Professor: [employer]    Comment: truck Air traffic controller: [employer]  Tobacco Use   Smoking status: Former    Current packs/day: 0.00    Average packs/day: 1 pack/day for 57.0 years (57.0 ttl pk-yrs)    Types: Cigarettes, Cigars    Start date: 08/07/1958    Quit date: 08/08/2015    Years since quitting: 7.9   Smokeless tobacco: Former    Types: Chew    Quit date: 11/04/1958   Tobacco comments:    Will smoke cigar every once in awhile.  Vaping daily started a couple weeks ago.  04/18/22 hfb  Vaping Use   Vaping status: Some Days  Substance and Sexual Activity   Alcohol use: Not Currently    Alcohol/week: 0.0 standard drinks of alcohol   Drug use: No   Sexual activity: Not Currently  Other Topics Concern   Not on file  Social History Narrative   Not on file   Social Determinants of Health   Financial Resource Strain: Low Risk  (05/22/2023)   Overall Financial Resource Strain (CARDIA)    Difficulty of Paying Living Expenses: Not very hard  Food Insecurity: No Food Insecurity (05/19/2023)   Hunger Vital Sign    Worried About Running Out of Food in the Last Year: Never true    Ran Out of Food in the Last Year: Never true  Transportation Needs: No Transportation Needs (05/19/2023)   PRAPARE - Transportation    Lack of Transportation (Medical): No    Lack of  Transportation (Non-Medical): No  Physical Activity: Insufficiently Active (08/05/2021)   Exercise Vital Sign    Days of Exercise per Week: 2 days    Minutes of Exercise per Session: 40 min   Stress: Stress Concern Present (05/22/2023)   [employer] of Occupational Health - Occupational Stress Questionnaire    Feeling of Stress : Rather much  Social Connections: Moderately Isolated (05/13/2022)   Social Connection and Isolation Panel [NHANES]    Frequency of Communication with Friends and Family: Twice a week    Frequency of Social Gatherings with Friends and Family: Once a week    Attends Religious Services: Never    Database administrator or Organizations: No    Attends Engineer, structural: Never    Marital Status: Married     Family History: The patient's family history includes Atrial fibrillation in his son; Emphysema in his father; Learning disabilities in his son; Leukemia in his father and another family member; Stroke in an other family member. ROS:   Please see the history of present illness.     All other systems reviewed and are negative.  EKGs/Labs/Other Studies Reviewed:    The following studies were reviewed today:   EKG:   EKG Interpretation Date/Time:  Monday August 04 2023 11:48:53 EDT Ventricular Rate:  172 PR Interval:    QRS Duration:  98 QT Interval:  288 QTC Calculation: 487 R Axis:   -41  Text Interpretation: Atrial flutter with 2:1 A-V conduction Left axis deviation ST & T wave abnormality, consider lateral ischemia When compared with ECG of 22-Jul-2023 13:47, ventricular rate is faster since previous ECG Confirmed by Kristeen Miss ([id number]) on 08/04/2023 12:00:10 PM    Recent Labs: 04/02/2023: TSH 2.190 07/22/2023: ALT 16 07/25/2023: Brain Natriuretic Peptide 63 07/29/2023: BUN 29; Creatinine, Ser 0.96; Hemoglobin 12.2; Magnesium 1.9; Platelets 307.0; Potassium 4.4; Sodium 139  Recent Lipid Panel    Component Value Date/Time   CHOL 127 10/07/2022 1128   TRIG 93 10/07/2022 1128   HDL 64 10/07/2022 1128   CHOLHDL 2.0 10/07/2022 1128   CHOLHDL 2 07/13/2021 1336   VLDL 14.4 07/13/2021 1336   LDLCALC 46 10/07/2022 1128    LDLCALC 62 04/11/2021 1421   LDLDIRECT 142.2 12/26/2008 1032    Physical Exam:    Physical Exam: Blood pressure 126/60, pulse (!) 173, height 5\' 8"  (1.727 m), weight 174 lb (78.9 kg), SpO2 95%.     GEN:  Well nourished, well developed in no acute distress HEENT: Normal NECK: No JVD; No carotid bruits LYMPHATICS: No lymphadenopathy CARDIAC: RRR RR tachy , no murmurs, rubs, gallops RESPIRATORY:  bilateral wheezing,  L>R ABDOMEN: Soft, non-tender, non-distended MUSCULOSKELETAL:  2 + pitting edema No deformity  SKIN: Warm and dry NEUROLOGIC:  Alert and oriented x 3    ASSESSMENT:    1. Essential hypertension   2. PAF (paroxysmal atrial fibrillation) (HCC)       PLAN:      1.  Paroxysmal atrial fibrillation:    Now presents with atrial fibrillation with a rapid ventricular response. He is actively wheezing.  It is difficult to know whether this is all due to congestive heart failure or perhaps as a COPD exacerbation.  His heart rate is 178.  At this point we do not have any options to quickly slow his heart rate down and make him feel better.  Will we need to send him to the emergency room to be admitted by the medicine team.  Cardiology  will see him in consultation.     2. Hyperlipidemia:      3. Lung cancer:    4.  CAD :  s/p CABG ;   no angina.  Long time smoker.  He has significant COPD.  5.  Wheezing :  / pseudowheezing .     6.   acute shortness of breath.  Possibilities include COPD exacerbation, congestive heart failure, rapid atrial fibrillation.  He has been on long-term anticoagulation -I would suspect that it is a low likelihood that he has a pulmonary embolus but this may need to be considered if he does not respond to correction of the above-noted issues.   Medication Adjustments/Labs and Tests Ordered: Current medicines are reviewed at length with the patient today.  Concerns regarding medicines are outlined above.  Orders Placed This Encounter   Procedures   EKG 12-Lead    No orders of the defined types were placed in this encounter.      Signed, Kristeen Miss, MD  08/04/2023 12:18 PM    [facility]

## 2023-08-03 NOTE — Assessment & Plan Note (Signed)
Treating for suspected COPD flare up, received levalbuterol and depo medrol 80mg . Sending in prednisoen taper. Currently on Augmentin for mild diverticulitis. Checking BNP due to hx heart failure and LE swelling, diuretic may need to be adjusted for short time. VSS, O2 98% RA. Wheezing on exam to upper airway.    Recommendations: - Continue Breztri two puffs morning and evening - Start mucinex 600mg  twice daily x 1 week  - Continue Augmentin antibiotic until complete  - Start prednisone taper tomorrow   Office treatment - Levalbuterol nebulizer x1 - Depo-medrol 80mg  IM x1  Orders: - Labs (bnp- ordered) - CXR (done)  Folllow-up - 1-2 weeks with either Dr. Celine Mans or Waynetta Sandy NP

## 2023-08-04 ENCOUNTER — Other Ambulatory Visit: Payer: Self-pay

## 2023-08-04 ENCOUNTER — Emergency Department (HOSPITAL_COMMUNITY): Payer: Medicare Other

## 2023-08-04 ENCOUNTER — Telehealth: Payer: Self-pay | Admitting: Family

## 2023-08-04 ENCOUNTER — Ambulatory Visit: Payer: Medicare Other | Attending: Cardiovascular Disease | Admitting: Cardiovascular Disease

## 2023-08-04 ENCOUNTER — Encounter (HOSPITAL_COMMUNITY): Payer: Self-pay

## 2023-08-04 ENCOUNTER — Inpatient Hospital Stay (HOSPITAL_COMMUNITY)
Admission: EM | Admit: 2023-08-04 | Discharge: 2023-08-09 | DRG: 190 | Disposition: A | Discharge status: Home / Self Care | Payer: Medicare Other | Attending: Internal Medicine | Admitting: Internal Medicine

## 2023-08-04 VITALS — BP 126/60 | HR 173 | Ht 68.0 in | Wt 174.0 lb

## 2023-08-04 DIAGNOSIS — I480 Paroxysmal atrial fibrillation: Secondary | ICD-10-CM

## 2023-08-04 DIAGNOSIS — D329 Benign neoplasm of meninges, unspecified: Secondary | ICD-10-CM | POA: Diagnosis not present

## 2023-08-04 DIAGNOSIS — Z743 Need for continuous supervision: Secondary | ICD-10-CM | POA: Diagnosis not present

## 2023-08-04 DIAGNOSIS — R000 Tachycardia, unspecified: Secondary | ICD-10-CM | POA: Diagnosis not present

## 2023-08-04 DIAGNOSIS — I4821 Permanent atrial fibrillation: Secondary | ICD-10-CM | POA: Diagnosis not present

## 2023-08-04 DIAGNOSIS — Z66 Do not resuscitate: Secondary | ICD-10-CM | POA: Diagnosis not present

## 2023-08-04 DIAGNOSIS — J441 Chronic obstructive pulmonary disease with (acute) exacerbation: Principal | ICD-10-CM | POA: Insufficient documentation

## 2023-08-04 DIAGNOSIS — F17290 Nicotine dependence, other tobacco product, uncomplicated: Secondary | ICD-10-CM | POA: Diagnosis present

## 2023-08-04 DIAGNOSIS — R079 Chest pain, unspecified: Secondary | ICD-10-CM | POA: Diagnosis not present

## 2023-08-04 DIAGNOSIS — Z1152 Encounter for screening for COVID-19: Secondary | ICD-10-CM

## 2023-08-04 DIAGNOSIS — Z9049 Acquired absence of other specified parts of digestive tract: Secondary | ICD-10-CM | POA: Diagnosis not present

## 2023-08-04 DIAGNOSIS — R0602 Shortness of breath: Secondary | ICD-10-CM | POA: Diagnosis not present

## 2023-08-04 DIAGNOSIS — K5732 Diverticulitis of large intestine without perforation or abscess without bleeding: Secondary | ICD-10-CM | POA: Diagnosis present

## 2023-08-04 DIAGNOSIS — I4892 Unspecified atrial flutter: Secondary | ICD-10-CM | POA: Diagnosis not present

## 2023-08-04 DIAGNOSIS — Z7951 Long term (current) use of inhaled steroids: Secondary | ICD-10-CM

## 2023-08-04 DIAGNOSIS — K219 Gastro-esophageal reflux disease without esophagitis: Secondary | ICD-10-CM | POA: Diagnosis not present

## 2023-08-04 DIAGNOSIS — Z87442 Personal history of urinary calculi: Secondary | ICD-10-CM

## 2023-08-04 DIAGNOSIS — Z91041 Radiographic dye allergy status: Secondary | ICD-10-CM

## 2023-08-04 DIAGNOSIS — Z806 Family history of leukemia: Secondary | ICD-10-CM

## 2023-08-04 DIAGNOSIS — I5032 Chronic diastolic (congestive) heart failure: Secondary | ICD-10-CM | POA: Diagnosis not present

## 2023-08-04 DIAGNOSIS — Z515 Encounter for palliative care: Secondary | ICD-10-CM | POA: Diagnosis not present

## 2023-08-04 DIAGNOSIS — F1021 Alcohol dependence, in remission: Secondary | ICD-10-CM | POA: Diagnosis present

## 2023-08-04 DIAGNOSIS — M1990 Unspecified osteoarthritis, unspecified site: Secondary | ICD-10-CM | POA: Diagnosis not present

## 2023-08-04 DIAGNOSIS — I714 Abdominal aortic aneurysm, without rupture, unspecified: Secondary | ICD-10-CM | POA: Diagnosis present

## 2023-08-04 DIAGNOSIS — Z825 Family history of asthma and other chronic lower respiratory diseases: Secondary | ICD-10-CM | POA: Diagnosis not present

## 2023-08-04 DIAGNOSIS — E1151 Type 2 diabetes mellitus with diabetic peripheral angiopathy without gangrene: Secondary | ICD-10-CM | POA: Diagnosis present

## 2023-08-04 DIAGNOSIS — Z7189 Other specified counseling: Secondary | ICD-10-CM | POA: Diagnosis not present

## 2023-08-04 DIAGNOSIS — Z7984 Long term (current) use of oral hypoglycemic drugs: Secondary | ICD-10-CM

## 2023-08-04 DIAGNOSIS — I5033 Acute on chronic diastolic (congestive) heart failure: Secondary | ICD-10-CM | POA: Diagnosis present

## 2023-08-04 DIAGNOSIS — I4891 Unspecified atrial fibrillation: Secondary | ICD-10-CM | POA: Diagnosis present

## 2023-08-04 DIAGNOSIS — R062 Wheezing: Secondary | ICD-10-CM | POA: Diagnosis not present

## 2023-08-04 DIAGNOSIS — I2510 Atherosclerotic heart disease of native coronary artery without angina pectoris: Secondary | ICD-10-CM | POA: Diagnosis not present

## 2023-08-04 DIAGNOSIS — I5043 Acute on chronic combined systolic (congestive) and diastolic (congestive) heart failure: Secondary | ICD-10-CM

## 2023-08-04 DIAGNOSIS — R918 Other nonspecific abnormal finding of lung field: Secondary | ICD-10-CM | POA: Diagnosis not present

## 2023-08-04 DIAGNOSIS — E7800 Pure hypercholesterolemia, unspecified: Secondary | ICD-10-CM

## 2023-08-04 DIAGNOSIS — Z6826 Body mass index (BMI) 26.0-26.9, adult: Secondary | ICD-10-CM

## 2023-08-04 DIAGNOSIS — Z951 Presence of aortocoronary bypass graft: Secondary | ICD-10-CM

## 2023-08-04 DIAGNOSIS — E119 Type 2 diabetes mellitus without complications: Secondary | ICD-10-CM

## 2023-08-04 DIAGNOSIS — I10 Essential (primary) hypertension: Secondary | ICD-10-CM | POA: Diagnosis present

## 2023-08-04 DIAGNOSIS — Z85118 Personal history of other malignant neoplasm of bronchus and lung: Secondary | ICD-10-CM | POA: Diagnosis not present

## 2023-08-04 DIAGNOSIS — J4489 Other specified chronic obstructive pulmonary disease: Secondary | ICD-10-CM

## 2023-08-04 DIAGNOSIS — I4819 Other persistent atrial fibrillation: Secondary | ICD-10-CM

## 2023-08-04 DIAGNOSIS — I110 Hypertensive heart disease with heart failure: Secondary | ICD-10-CM | POA: Diagnosis present

## 2023-08-04 DIAGNOSIS — I499 Cardiac arrhythmia, unspecified: Secondary | ICD-10-CM | POA: Diagnosis not present

## 2023-08-04 DIAGNOSIS — Z923 Personal history of irradiation: Secondary | ICD-10-CM

## 2023-08-04 DIAGNOSIS — Z823 Family history of stroke: Secondary | ICD-10-CM

## 2023-08-04 DIAGNOSIS — J439 Emphysema, unspecified: Secondary | ICD-10-CM

## 2023-08-04 DIAGNOSIS — Z888 Allergy status to other drugs, medicaments and biological substances status: Secondary | ICD-10-CM

## 2023-08-04 DIAGNOSIS — I2583 Coronary atherosclerosis due to lipid rich plaque: Secondary | ICD-10-CM | POA: Diagnosis not present

## 2023-08-04 DIAGNOSIS — E663 Overweight: Secondary | ICD-10-CM | POA: Diagnosis present

## 2023-08-04 DIAGNOSIS — N400 Enlarged prostate without lower urinary tract symptoms: Secondary | ICD-10-CM | POA: Diagnosis present

## 2023-08-04 DIAGNOSIS — Z79899 Other long term (current) drug therapy: Secondary | ICD-10-CM

## 2023-08-04 DIAGNOSIS — Z7901 Long term (current) use of anticoagulants: Secondary | ICD-10-CM

## 2023-08-04 LAB — CBC
HCT: 41.1 % (ref 39.0–52.0)
Hemoglobin: 12.6 g/dL — ABNORMAL LOW (ref 13.0–17.0)
MCH: 27.1 pg (ref 26.0–34.0)
MCHC: 30.7 g/dL (ref 30.0–36.0)
MCV: 88.4 fL (ref 80.0–100.0)
Platelets: 291 10*3/uL (ref 150–400)
RBC: 4.65 MIL/uL (ref 4.22–5.81)
RDW: 17.1 % — ABNORMAL HIGH (ref 11.5–15.5)
WBC: 13.4 10*3/uL — ABNORMAL HIGH (ref 4.0–10.5)
nRBC: 0 % (ref 0.0–0.2)

## 2023-08-04 LAB — RESP PANEL BY RT-PCR (RSV, FLU A&B, COVID)  RVPGX2
Influenza A by PCR: NEGATIVE
Influenza B by PCR: NEGATIVE
Resp Syncytial Virus by PCR: NEGATIVE
SARS Coronavirus 2 by RT PCR: NEGATIVE

## 2023-08-04 LAB — BASIC METABOLIC PANEL
Anion gap: 12 (ref 5–15)
BUN: 27 mg/dL — ABNORMAL HIGH (ref 8–23)
CO2: 23 mmol/L (ref 22–32)
Calcium: 8.9 mg/dL (ref 8.9–10.3)
Chloride: 102 mmol/L (ref 98–111)
Creatinine, Ser: 0.95 mg/dL (ref 0.61–1.24)
GFR, Estimated: >60 mL/min (ref >60)
Glucose, Bld: 127 mg/dL — ABNORMAL HIGH (ref 70–99)
Potassium: 3.9 mmol/L (ref 3.5–5.1)
Sodium: 137 mmol/L (ref 135–145)

## 2023-08-04 LAB — I-STAT VENOUS BLOOD GAS, ED
Acid-Base Excess: 4 mmol/L — ABNORMAL HIGH (ref 0.0–2.0)
Bicarbonate: 25.4 mmol/L (ref 20.0–28.0)
Calcium, Ion: 1.02 mmol/L — ABNORMAL LOW (ref 1.15–1.40)
HCT: 40 % (ref 39.0–52.0)
Hemoglobin: 13.6 g/dL (ref 13.0–17.0)
O2 Saturation: 99 %
Potassium: 3.8 mmol/L (ref 3.5–5.1)
Sodium: 135 mmol/L (ref 135–145)
TCO2: 26 mmol/L (ref 22–32)
pCO2, Ven: 29.6 mm[Hg] — ABNORMAL LOW (ref 44–60)
pH, Ven: 7.542 — ABNORMAL HIGH (ref 7.25–7.43)
pO2, Ven: 136 mm[Hg] — ABNORMAL HIGH (ref 32–45)

## 2023-08-04 LAB — BRAIN NATRIURETIC PEPTIDE: B Natriuretic Peptide: 134.1 pg/mL — ABNORMAL HIGH (ref 0.0–100.0)

## 2023-08-04 LAB — CBG MONITORING, ED: Glucose-Capillary: 280 mg/dL — ABNORMAL HIGH (ref 70–99)

## 2023-08-04 MED ORDER — POTASSIUM CHLORIDE CRYS ER 10 MEQ PO TBCR
10.0000 meq | EXTENDED_RELEASE_TABLET | Freq: Every day | ORAL | Status: DC
  Administered 2023-08-05 – 2023-08-09 (×5): 10 meq via ORAL
  Filled 2023-08-04 (×6): qty 1

## 2023-08-04 MED ORDER — METOPROLOL TARTRATE 5 MG/5ML IV SOLN
5.0000 mg | INTRAVENOUS | Status: DC | PRN
  Administered 2023-08-06 – 2023-08-07 (×2): 5 mg via INTRAVENOUS
  Filled 2023-08-04 (×3): qty 5

## 2023-08-04 MED ORDER — BUDESONIDE 0.25 MG/2ML IN SUSP
0.2500 mg | Freq: Two times a day (BID) | RESPIRATORY_TRACT | Status: DC
  Administered 2023-08-04 – 2023-08-09 (×10): 0.25 mg via RESPIRATORY_TRACT
  Filled 2023-08-04 (×9): qty 2

## 2023-08-04 MED ORDER — RIVAROXABAN 20 MG PO TABS
20.0000 mg | ORAL_TABLET | Freq: Every evening | ORAL | Status: DC
  Administered 2023-08-04 – 2023-08-08 (×5): 20 mg via ORAL
  Filled 2023-08-04 (×4): qty 1
  Filled 2023-08-04: qty 2
  Filled 2023-08-04: qty 1

## 2023-08-04 MED ORDER — SENNOSIDES-DOCUSATE SODIUM 8.6-50 MG PO TABS
1.0000 | ORAL_TABLET | Freq: Every evening | ORAL | Status: DC | PRN
  Administered 2023-08-08: 1 via ORAL
  Filled 2023-08-04: qty 1

## 2023-08-04 MED ORDER — ONDANSETRON HCL 4 MG PO TABS: 4.0000 mg | ORAL_TABLET | Freq: Four times a day (QID) | ORAL | Status: DC | PRN

## 2023-08-04 MED ORDER — DILTIAZEM HCL 25 MG/5ML IV SOLN
10.0000 mg | Freq: Once | INTRAVENOUS | Status: AC
  Administered 2023-08-04: 10 mg via INTRAVENOUS
  Filled 2023-08-04: qty 5

## 2023-08-04 MED ORDER — LEVALBUTEROL HCL 0.63 MG/3ML IN NEBU: 0.6300 mg | INHALATION_SOLUTION | Freq: Four times a day (QID) | RESPIRATORY_TRACT | Status: DC | PRN

## 2023-08-04 MED ORDER — TAMSULOSIN HCL 0.4 MG PO CAPS
0.4000 mg | ORAL_CAPSULE | Freq: Every day | ORAL | Status: DC
  Administered 2023-08-05 – 2023-08-09 (×5): 0.4 mg via ORAL
  Filled 2023-08-04 (×6): qty 1

## 2023-08-04 MED ORDER — PREDNISONE 20 MG PO TABS
40.0000 mg | ORAL_TABLET | Freq: Every day | ORAL | Status: DC
  Administered 2023-08-05 – 2023-08-06 (×2): 40 mg via ORAL
  Filled 2023-08-04 (×2): qty 2

## 2023-08-04 MED ORDER — ARFORMOTEROL TARTRATE 15 MCG/2ML IN NEBU
15.0000 ug | INHALATION_SOLUTION | Freq: Two times a day (BID) | RESPIRATORY_TRACT | Status: DC
  Administered 2023-08-04 – 2023-08-09 (×10): 15 ug via RESPIRATORY_TRACT
  Filled 2023-08-04 (×9): qty 2

## 2023-08-04 MED ORDER — DILTIAZEM HCL ER COATED BEADS 120 MG PO CP24
120.0000 mg | ORAL_CAPSULE | Freq: Every day | ORAL | Status: DC
  Administered 2023-08-05 – 2023-08-09 (×5): 120 mg via ORAL
  Filled 2023-08-04 (×6): qty 1

## 2023-08-04 MED ORDER — ACETAMINOPHEN 650 MG RE SUPP: 650.0000 mg | Freq: Four times a day (QID) | RECTAL | Status: DC | PRN

## 2023-08-04 MED ORDER — ALBUTEROL SULFATE (2.5 MG/3ML) 0.083% IN NEBU
10.0000 mg | INHALATION_SOLUTION | Freq: Once | RESPIRATORY_TRACT | Status: AC
  Administered 2023-08-04: 10 mg via RESPIRATORY_TRACT
  Filled 2023-08-04: qty 12

## 2023-08-04 MED ORDER — SPIRONOLACTONE 25 MG PO TABS
25.0000 mg | ORAL_TABLET | Freq: Every day | ORAL | Status: DC
  Administered 2023-08-05 – 2023-08-09 (×5): 25 mg via ORAL
  Filled 2023-08-04 (×2): qty 1
  Filled 2023-08-04: qty 2
  Filled 2023-08-04 (×2): qty 1

## 2023-08-04 MED ORDER — FUROSEMIDE 40 MG PO TABS
40.0000 mg | ORAL_TABLET | Freq: Every day | ORAL | Status: DC
  Administered 2023-08-05: 40 mg via ORAL
  Filled 2023-08-04: qty 2

## 2023-08-04 MED ORDER — ONDANSETRON HCL 4 MG/2ML IJ SOLN: 4.0000 mg | Freq: Four times a day (QID) | INTRAMUSCULAR | Status: DC | PRN

## 2023-08-04 MED ORDER — ACETAMINOPHEN 325 MG PO TABS: 650.0000 mg | ORAL_TABLET | Freq: Four times a day (QID) | ORAL | Status: DC | PRN

## 2023-08-04 MED ORDER — SODIUM CHLORIDE 0.9% FLUSH
3.0000 mL | Freq: Two times a day (BID) | INTRAVENOUS | Status: DC
  Administered 2023-08-04 – 2023-08-07 (×6): 3 mL via INTRAVENOUS

## 2023-08-04 MED ORDER — INSULIN ASPART 100 UNIT/ML IJ SOLN
0.0000 [IU] | Freq: Three times a day (TID) | INTRAMUSCULAR | Status: DC
  Administered 2023-08-05 – 2023-08-06 (×5): 2 [IU] via SUBCUTANEOUS
  Administered 2023-08-07: 5 [IU] via SUBCUTANEOUS
  Administered 2023-08-07: 3 [IU] via SUBCUTANEOUS
  Administered 2023-08-08 (×2): 2 [IU] via SUBCUTANEOUS
  Administered 2023-08-08: 5 [IU] via SUBCUTANEOUS

## 2023-08-04 MED ORDER — ATORVASTATIN CALCIUM 80 MG PO TABS
80.0000 mg | ORAL_TABLET | Freq: Every day | ORAL | Status: DC
  Administered 2023-08-05 – 2023-08-09 (×5): 80 mg via ORAL
  Filled 2023-08-04: qty 2
  Filled 2023-08-04 (×4): qty 1

## 2023-08-04 MED ORDER — AMIODARONE HCL 200 MG PO TABS
100.0000 mg | ORAL_TABLET | ORAL | Status: DC
  Filled 2023-08-04 (×2): qty 1

## 2023-08-04 MED ORDER — MAGNESIUM SULFATE 2 GM/50ML IV SOLN
2.0000 g | Freq: Once | INTRAVENOUS | Status: AC
  Administered 2023-08-04: 2 g via INTRAVENOUS
  Filled 2023-08-04: qty 50

## 2023-08-04 MED ORDER — METOPROLOL TARTRATE 5 MG/5ML IV SOLN
2.5000 mg | INTRAVENOUS | Status: DC | PRN
  Administered 2023-08-04: 2.5 mg via INTRAVENOUS
  Filled 2023-08-04: qty 5

## 2023-08-04 MED ORDER — METHYLPREDNISOLONE SODIUM SUCC 125 MG IJ SOLR
125.0000 mg | Freq: Once | INTRAMUSCULAR | Status: AC
  Administered 2023-08-04: 125 mg via INTRAVENOUS
  Filled 2023-08-04: qty 2

## 2023-08-04 MED ORDER — IPRATROPIUM-ALBUTEROL 0.5-2.5 (3) MG/3ML IN SOLN
3.0000 mL | Freq: Once | RESPIRATORY_TRACT | Status: AC
  Administered 2023-08-04: 3 mL via RESPIRATORY_TRACT
  Filled 2023-08-04: qty 3

## 2023-08-04 NOTE — Telephone Encounter (Signed)
Pt states he is having issues with ongoing SOB and feet selling. Spoke with Windell Moulding and Efraim Kaufmann and advised pt to make cardiology aware at his appt today.

## 2023-08-04 NOTE — ED Provider Notes (Signed)
Here w afib/ rvr./COPD exacerbation Brain / lung cancer.   Physical Exam  BP 105/65   Pulse 76   Temp 98 F (36.7 C)   Resp 19   SpO2 96%   Physical Exam  Procedures  Procedures  ED Course / MDM   Clinical Course as of 08/04/23 1820  Mon Aug 04, 2023  1519 Now on hour long neb., needs admission. Failure vs infection [AH]  1728 Patient reevaluated.  He is moving air very well and states he is feeling much better.  Current plan at this time is to allow him to continue his hour-long neb.  Ambulate with pulse ox.  He is heart rate is currently 86 on the monitor.  Think if he can ambulate with a normal pulse ox and he can likely go home today. [AH]  1817 Patient back in Afib w/ rvr  [AH]    Clinical Course User Index [AH] Arthor Captain, PA-C   Medical Decision Making Amount and/or Complexity of Data Reviewed Labs: ordered. Radiology: ordered.  Risk Prescription drug management. Decision regarding hospitalization.   Clinical Course as of 08/04/23 1820  Mon Aug 04, 2023  1519 Now on hour long neb., needs admission. Failure vs infection [AH]  1728 Patient reevaluated.  He is moving air very well and states he is feeling much better.  Current plan at this time is to allow him to continue his hour-long neb.  Ambulate with pulse ox.  He is heart rate is currently 86 on the monitor.  Think if he can ambulate with a normal pulse ox and he can likely go home today. [AH]  1817 Patient back in Afib w/ rvr  [AH]    Clinical Course User Index [AH] Arthor Captain, PA-C   Patient with paroxysmal atrial fibrillation with RVR.  Multiple episodes of high heart rate.  Will need overnight obvious and telemetry monitoring.  Discussed with Dr. Allena Katz who will admit the patient.  Primary admission for copd exacerbation. Medications  diltiazem (CARDIZEM) injection 10 mg (10 mg Intravenous Not Given 08/04/23 1711)  ipratropium-albuterol (DUONEB) 0.5-2.5 (3) MG/3ML nebulizer solution 3 mL  (3 mLs Nebulization Given 08/04/23 1344)  methylPREDNISolone sodium succinate (SOLU-MEDROL) 125 mg/2 mL injection 125 mg (125 mg Intravenous Given 08/04/23 1344)  magnesium sulfate IVPB 2 g 50 mL (0 g Intravenous Stopped 08/04/23 1437)  albuterol (PROVENTIL) (2.5 MG/3ML) 0.083% nebulizer solution 10 mg (10 mg Nebulization Given 08/04/23 1549)        Arthor Captain, PA-C 08/04/23 2232    Terrilee Files, MD 08/05/23 1001

## 2023-08-04 NOTE — ED Notes (Signed)
Dr Allena Katz, admitting provider, aware that pt's HR is 135-160. Pt is asymptomatic. Will administer PRN medications and continue to monitor pt

## 2023-08-04 NOTE — Telephone Encounter (Signed)
Patient currently at ED

## 2023-08-04 NOTE — Patient Instructions (Signed)
Medication Instructions:   Your physician recommends that you continue on your current medications as directed. Please refer to the Current Medication list given to you today.  *If you need a refill on your cardiac medications before your next appointment, please call your pharmacy*    Follow-Up:  DR. Elease Hashimoto WILL BE SENDING YOUR TO THE EMERGENCY ROOM VIA EMS FOR RAPID AFLUTTER, WHEEZING, AND COPD EXACERBATION

## 2023-08-04 NOTE — Hospital Course (Signed)
Robert Bates is a 82 y.o. 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

## 2023-08-04 NOTE — ED Triage Notes (Signed)
Pt BIBEMS w cc of SOB. Pt coming from [facility] found to be in aflutter w hr 100-165, wheezing noted and received duoneb x1 from EMS pta.

## 2023-08-04 NOTE — ED Provider Notes (Signed)
[place] EMERGENCY DEPARTMENT AT [facility] Provider Note   CSN: [id number] Arrival date & time: 08/04/23  1252     History  Chief Complaint  Patient presents with   Shortness of Breath    Robert Bates is a 82 y.o. male with past medical history significant for coronary artery disease, abdominal aortic aneurysm, hyperlipidemia, paroxysmal A-fib, lung cancer, meningioma, COPD, aortic dilatation, diabetes who presents with concern for shortness of breath, as well as increased swelling of legs, abdomen.  Patient coming from heart care, found to be in a flutter with heart rate of 100-1 65, wheezing noted and received 1 DuoNeb en route with EMS.  Patient reports he continues to feel short of breath.  He endorses some swelling of the abdomen that is worse than baseline.  He denies any chest pain at this time.   Shortness of Breath      Home Medications Prior to Admission medications   Medication Sig Start Date End Date Taking? Authorizing Provider  acetaminophen (TYLENOL) 500 MG tablet Take 1-2 tablets (500-1,000 mg total) by mouth every 6 (six) hours as needed. Patient taking differently: Take 1,000 mg by mouth daily as needed for headache, fever or moderate pain. 05/16/21   Ardelle Balls, PA-C  albuterol (PROVENTIL) (2.5 MG/3ML) 0.083% nebulizer solution Take 3 mLs (2.5 mg total) by nebulization every 6 (six) hours as needed for wheezing or shortness of breath. 10/17/21   Charlott Holler, MD  amiodarone (PACERONE) 200 MG tablet Take 1/2 tablet (100mg ) by mouth three days per week (M,W,F) Patient taking differently: Take 100 mg by mouth every Monday, Wednesday, and Friday. 10/07/22   Nahser, Deloris Ping, MD  amoxicillin-clavulanate (AUGMENTIN) 875-125 MG tablet Take 1 tablet by mouth every 12 (twelve) hours. 07/22/23   Glyn Ade, MD  ASCORBIC ACID PO Take 1 tablet by mouth daily. Vitamin C, unknown strength.    [provider]  atorvastatin (LIPITOR) 80 MG  tablet Take 1 tablet (80 mg total) by mouth daily. 01/17/23   Nahser, Deloris Ping, MD  benzonatate (TESSALON) 100 MG capsule Take 1 capsule (100 mg total) by mouth 3 (three) times daily as needed. 08/01/23   Sandford Craze, NP  Budeson-Glycopyrrol-Formoterol (BREZTRI AEROSPHERE) 160-9-4.8 MCG/ACT AERO Inhale 2 puffs into the lungs in the morning and at bedtime. Patient taking differently: Inhale 2 puffs into the lungs 2 (two) times daily as needed (wheezing, shortness of breath). 10/17/21   Charlott Holler, MD  diltiazem (CARDIZEM LA) 120 MG 24 hr tablet Take 1 tablet (120 mg total) by mouth daily. 07/29/23   Sandford Craze, NP  fluticasone (FLONASE) 50 MCG/ACT nasal spray Place 2 sprays into both nostrils daily. 09/04/22   Saguier, Ramon Dredge, PA-C  furosemide (LASIX) 40 MG tablet Take 1 tablet (40 mg total) by mouth daily. 05/20/23   Sandford Craze, NP  glipiZIDE (GLUCOTROL XL) 2.5 MG 24 hr tablet Take 1 tablet (2.5 mg total) by mouth daily with breakfast. 07/14/23   Sandford Craze, NP  omeprazole (PRILOSEC) 40 MG capsule Take 1 capsule (40 mg total) by mouth daily. 05/06/23   Sandford Craze, NP  potassium chloride (KLOR-CON) 10 MEQ tablet Take 1 tablet (10 mEq total) by mouth daily. 05/06/23   Tereso Newcomer T, PA-C  pregabalin (LYRICA) 25 MG capsule Take 1 capsule (25 mg total) by mouth 2 (two) times daily. 06/17/23   Sandford Craze, NP  rivaroxaban (XARELTO) 20 MG TABS tablet Take 1 tablet (20 mg total) by mouth every evening  with supper. 06/12/23   Nahser, Deloris Ping, MD  rivaroxaban (XARELTO) 20 MG TABS tablet Take 1 tablet (20 mg total) by mouth every evening with supper. 06/25/23   Eckard, Tammy, RPH-CPP  spironolactone (ALDACTONE) 25 MG tablet Take 1 tablet (25 mg total) by mouth daily. 05/09/23   Sandford Craze, NP  tamsulosin (FLOMAX) 0.4 MG CAPS capsule Take 1 capsule (0.4 mg total) by mouth daily. 05/09/23 05/08/24  Sandford Craze, NP      Allergies    Iodine, Iohexol, and  Metformin and related    Review of Systems   Review of Systems  Respiratory:  Positive for shortness of breath.   All other systems reviewed and are negative.   Physical Exam Updated Vital Signs BP 107/73   Pulse 86   Temp 97.9 F (36.6 C) (Oral)   Resp 14   SpO2 91%  Physical Exam Vitals and nursing note reviewed.  Constitutional:      General: He is not in acute distress.    Appearance: Normal appearance. He is ill-appearing.  HENT:     Head: Normocephalic and atraumatic.  Eyes:     General:        Right eye: No discharge.        Left eye: No discharge.  Cardiovascular:     Rate and Rhythm: Tachycardia present. Rhythm irregular.     Heart sounds: No murmur heard.    No friction rub. No gallop.  Pulmonary:     Effort: Pulmonary effort is normal.     Breath sounds: Normal breath sounds.     Comments: With inspiratory and expiratory wheezing bilaterally, poor respiratory effort, shallow respirations.  No focal consolidation, rhonchi, stridor. Abdominal:     General: Bowel sounds are normal.     Palpations: Abdomen is soft.  Musculoskeletal:     Comments: Bilateral lower extremity edema 2+, pitting  Skin:    General: Skin is warm and dry.     Capillary Refill: Capillary refill takes less than 2 seconds.  Neurological:     Mental Status: He is alert and oriented to person, place, and time.  Psychiatric:        Mood and Affect: Mood normal.        Behavior: Behavior normal.     ED Results / Procedures / Treatments   Labs (all labs ordered are listed, but only abnormal results are displayed) Labs Reviewed  CBC - Abnormal; Notable for the following components:      Result Value   WBC 13.4 (*)    Hemoglobin 12.6 (*)    RDW 17.1 (*)    All other components within normal limits  BASIC METABOLIC PANEL - Abnormal; Notable for the following components:   Glucose, Bld 127 (*)    BUN 27 (*)    All other components within normal limits  BRAIN NATRIURETIC PEPTIDE -  Abnormal; Notable for the following components:   B Natriuretic Peptide 134.1 (*)    All other components within normal limits  I-STAT VENOUS BLOOD GAS, ED - Abnormal; Notable for the following components:   pH, Ven 7.542 (*)    pCO2, Ven 29.6 (*)    pO2, Ven 136 (*)    Acid-Base Excess 4.0 (*)    Calcium, Ion 1.02 (*)    All other components within normal limits  RESP PANEL BY RT-PCR (RSV, FLU A&B, COVID)  [id number]    EKG EKG Interpretation Date/Time:  Monday August 04 2023 13:03:03 EDT Ventricular Rate:  85 PR Interval:  279 QRS Duration:  104 QT Interval:  348 QTC Calculation: 414 R Axis:   -23  Text Interpretation: Sinus tachycardia Multiple premature complexes, vent & supraven COPY Confirmed by Meridee Score [phone]) on 08/04/2023 3:04:54 PM  Radiology No results found.  Procedures Procedures    Medications Ordered in ED Medications  albuterol (PROVENTIL) (2.5 MG/3ML) 0.083% nebulizer solution 10 mg (has no administration in time range)  ipratropium-albuterol (DUONEB) 0.5-2.5 (3) MG/3ML nebulizer solution 3 mL (3 mLs Nebulization Given 08/04/23 1344)  methylPREDNISolone sodium succinate (SOLU-MEDROL) 125 mg/2 mL injection 125 mg (125 mg Intravenous Given 08/04/23 1344)  magnesium sulfate IVPB 2 g 50 mL (0 g Intravenous Stopped 08/04/23 1437)    ED Course/ Medical Decision Making/ A&P Clinical Course as of 08/04/23 1524  Mon Aug 04, 2023  1519 Now on hour long neb., needs admission. Failure vs infection [AH]    Clinical Course User Index [AH] Arthor Captain, PA-C                                 Medical Decision Making Amount and/or Complexity of Data Reviewed Labs: ordered. Radiology: ordered.  Risk Prescription drug management.   This patient is a 82 y.o. male who presents to the ED for concern of shob, fluid overload, this involves an extensive number of treatment options, and is a complaint that carries with it a high risk of complications and  morbidity. The emergent differential diagnosis prior to evaluation includes, but is not limited to,  asthma exacerbation, COPD exacerbation, acute upper respiratory infection, acute bronchitis, chronic bronchitis, interstitial lung disease, ARDS, PE, pneumonia, atypical ACS, carbon monoxide poisoning, spontaneous pneumothorax, new CHF vs CHF exacerbation, versus other . This is not an exhaustive differential.   Past Medical History / Co-morbidities / Social History: coronary artery disease, abdominal aortic aneurysm, hyperlipidemia, paroxysmal A-fib, lung cancer, meningioma, COPD, aortic dilatation, diabetes   Additional history: Chart reviewed. Pertinent results include: Reviewed lab work, imaging from recent previous emergency department visits, outpatient medicine and cardiology visits  Physical Exam: Physical exam performed. The pertinent findings include: Patient and A-fib, RVR with rapid ventricular rate, he has had some borderline oxygen saturation on room air, lowest around 91 to 92%.  He has biphasic wheezing in both fields, increased work of breathing, shallow respiration.  Lab Tests: I ordered, and personally interpreted labs.  The pertinent results include: CBC notable for leukocytosis, white blood cell 13.4.  VBG with mild base excess, likely secondary to hyperventilation, initially tachypneic.  BMP with elevated glucose at 127, BUN 27.  BNP is elevated mildly at 134.1.  RVP is pending at this time.   Imaging Studies: I ordered imaging studies including plain film chest x-ray. I independently visualized and interpreted imaging which showed increased vascular markings specially in the right lower lobe, as well as left lingula, suspect persistent effusion in the left lower lobe.  Official radiology read is pending at this time..   Cardiac Monitoring:  The patient was maintained on a cardiac monitor.  My attending physician Dr. Rush Landmark viewed and interpreted the cardiac monitored which  showed an underlying rhythm of: AFIb RVR. I agree with this interpretation.   Medications: I ordered medication including DuoNeb, Solu-Medrol, magnesium for acute COPD exacerbation.    3:24 PM Care of Robert Bates transferred to [facility] Arthor Captain and Dr. Rush Landmark at the end of my shift as the patient will require reassessment  once labs/imaging have resulted. Patient presentation, ED course, and plan of care discussed with review of all pertinent labs and imaging. Please see his/her note for further details regarding further ED course and disposition. Plan at time of handoff is patient will need admission for A-fib, RVR, COPD exacerbation. This may be altered or completely changed at the discretion of the oncoming team pending results of further workup.   Final Clinical Impression(s) / ED Diagnoses Final diagnoses:  None    Rx / DC Orders ED Discharge Orders     None         Olene Floss, PA-C 08/04/23 1524    Tegeler, Canary Brim, MD 08/04/23 [phone]

## 2023-08-04 NOTE — Consult Note (Signed)
Cardiology Office Note:    Date:  08/04/2023   ID:  Robert Bates, DOB 10/25/1941, MRN [id number]  PCP:  Sandford Craze, NP  Cardiologist:  Kristeen Miss, MD    Referring MD: Sandford Craze, NP   Chief Complaint  Patient presents with   Coronary Artery Disease        Hypertension        Atrial Fibrillation        Congestive Heart Failure         Previous notes:     Robert Bates is a 82 y.o. male with a hx of  PAF. He has documented PAF on monitor Cannot tell that he is in AF  I met him in the hospital previously  He has a history of tobacco abuse, alcoholism, lung cancer, COPD, peripheral vascular disease, abdominal aortic aneurysm, hypertension   Nov, 5, 2021: Robert Bates is seen today for follow of his PAF, tobacco abuse, alcoholism, lung CA, COPD, AAA HTN Still working , truck driving   On xarelto 20 mg a day  As a thoracic aneurism followed by dr. Gwenyth Bouillon    Dec. 14, 2022: Robert Bates is seen today for worsening shortness of breath  Hx of cigarette smoking, lung CA, COPD, AAA , HTN  He has had more dyspnea recently  Also some atypical CP and DOE   Has PAF - is on Xarelto  Has lots of DOE and wheezing .  Still smoking  Uses inhalers   Had CABG July 5, with L atrial clipping   Had post op Atrial fib   Dec. 4, 2023 Robert Bates is seen for follow up of his shortness of breath, AAA,COPD,HTN  Still driving turck [employer] smoking ,  vapes occasion  No cp, Not able to exercise .   Will check lipids , ALT , BMP today    Sept. 30, 2024 Robert Bates is seen for recurrence of his atrial fib He is more short of breath today   EKG shows atrial flutter with 2-1 AV block with a heart rate of 172.  Is very fatigued. 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   COPD (chronic obstructive pulmonary disease) (HCC)    Coronary artery disease    Diabetes type 2, controlled (HCC) 07/31/2017   Diverticulitis 07/29/2023   Dyspnea    Dysrhythmia    a fib   GERD (gastroesophageal reflux disease)    Hematuria    refuses work up or referral - understands risks of morbidity / mortality - 11/2008, 12/2008   Heme positive stool    History of hiatal hernia    History of kidney stones    Hyperlipemia    Meningioma (HCC) 10/25/2013   Follows with Dr. Coletta Memos.    Peripheral vascular disease (HCC)    Abdominal Aortic Aneursym   Pneumonia    as a child   Radiation 09/18/15-10/25/15   left lower lobe 70.2 Gy  Seizures (HCC) 02/18/2020   Tobacco abuse     Past Surgical History:  Procedure Laterality Date   CHOLECYSTECTOMY N/A 07/23/2017   Procedure: LAPAROSCOPIC CHOLECYSTECTOMY;  Surgeon: Kinsinger, De Blanch, MD;  Location: [facility];  Service: General;  Laterality: N/A;   CLIPPING OF ATRIAL APPENDAGE Left 05/08/2021   Procedure: CLIPPING OF ATRIAL APPENDAGE USING 45 ATRICLIP;  Surgeon: Linden Dolin, MD;  Location: [facility] OR;  Service: Open Heart Surgery;  Laterality: Left;   COLONOSCOPY     CORONARY ARTERY BYPASS GRAFT N/A 05/08/2021   Procedure: CORONARY ARTERY BYPASS GRAFTING (CABG)X 3 USING LEFT INTERNAL MAMMARY ARTERY AND RIGHT GREATER SAPEHNOUS VEIN;  Surgeon: Linden Dolin, MD;  Location: [facility] OR;  Service: Open Heart Surgery;  Laterality: N/A;   ENDOVEIN HARVEST OF GREATER SAPHENOUS VEIN Right 05/08/2021   Procedure: ENDOVEIN HARVEST OF GREATER SAPHENOUS VEIN;  Surgeon: Linden Dolin, MD;  Location: [facility] OR;  Service: Open Heart Surgery;  Laterality: Right;   EYE SURGERY  Bilateral    Cataracts removed w/ lens implant   HERNIA REPAIR     Left 36 years ago . Right inguinal hernia repair 10-01-17 Dr. Sheliah Hatch   INGUINAL HERNIA REPAIR Right 10/01/2017   Procedure: RIGHT INGUINAL HERNIA REPAIR WITH MESH;  Surgeon: Kinsinger, De Blanch, MD;  Location: [facility];  Service: General;  Laterality: Right;  TAP BLOCK   INSERTION OF MESH Right 10/01/2017   Procedure: INSERTION OF MESH;  Surgeon: Sheliah Hatch De Blanch, MD;  Location: [facility];  Service: General;  Laterality: Right;   IR THORACENTESIS ASP PLEURAL SPACE W/IMG GUIDE  05/18/2021   IR THORACENTESIS ASP PLEURAL SPACE W/IMG GUIDE  06/07/2021   LEFT HEART CATH AND CORONARY ANGIOGRAPHY N/A 04/20/2021   Procedure: LEFT HEART CATH AND CORONARY ANGIOGRAPHY;  Surgeon: Iran Ouch, MD;  Location: [facility];  Service: Cardiovascular;  Laterality: N/A;   TEE WITHOUT CARDIOVERSION N/A 05/08/2021   Procedure: TRANSESOPHAGEAL ECHOCARDIOGRAM (TEE);  Surgeon: Linden Dolin, MD;  Location: [facility] OR;  Service: Open Heart Surgery;  Laterality: N/A;   TONSILLECTOMY     TONSILLECTOMY     VIDEO BRONCHOSCOPY Bilateral 07/26/2015   Procedure: VIDEO BRONCHOSCOPY WITH FLUORO;  Surgeon: Nyoka Cowden, MD;  Location: [facility];  Service: Cardiopulmonary;  Laterality: Bilateral;   VIDEO BRONCHOSCOPY WITH ENDOBRONCHIAL NAVIGATION N/A 08/23/2015   Procedure: VIDEO BRONCHOSCOPY WITH ENDOBRONCHIAL NAVIGATION;  Surgeon: Delight Ovens, MD;  Location: [facility];  Service: Thoracic;  Laterality: N/A;   VIDEO BRONCHOSCOPY WITH ENDOBRONCHIAL ULTRASOUND N/A 08/23/2015   Procedure: VIDEO BRONCHOSCOPY WITH ENDOBRONCHIAL ULTRASOUND;  Surgeon: Delight Ovens, MD;  Location: [facility] OR;  Service: Thoracic;  Laterality: N/A;    Current Medications: Current Meds  Medication Sig   acetaminophen (TYLENOL) 500 MG tablet Take 1-2 tablets (500-1,000 mg total) by mouth every 6 (six) hours as needed. (Patient taking differently: Take 1,000 mg by mouth  daily as needed for headache, fever or moderate pain.)   albuterol (PROVENTIL) (2.5 MG/3ML) 0.083% nebulizer solution Take 3 mLs (2.5 mg total) by nebulization every 6 (six) hours as needed for wheezing or shortness of breath.   amiodarone (PACERONE) 200 MG tablet Take 1/2 tablet (100mg ) by mouth three days per week (M,W,F) (Patient taking differently: Take 100 mg by mouth every Monday, Wednesday, and Friday.)   amoxicillin-clavulanate (AUGMENTIN) 875-125 MG tablet Take 1 tablet by mouth every 12 (twelve) hours.   ASCORBIC ACID PO Take 1 tablet by mouth daily. Vitamin C, unknown strength.  atorvastatin (LIPITOR) 80 MG tablet Take 1 tablet (80 mg total) by mouth daily.   benzonatate (TESSALON) 100 MG capsule Take 1 capsule (100 mg total) by mouth 3 (three) times daily as needed.   Budeson-Glycopyrrol-Formoterol (BREZTRI AEROSPHERE) 160-9-4.8 MCG/ACT AERO Inhale 2 puffs into the lungs in the morning and at bedtime. (Patient taking differently: Inhale 2 puffs into the lungs 2 (two) times daily as needed (wheezing, shortness of breath).)   diltiazem (CARDIZEM LA) 120 MG 24 hr tablet Take 1 tablet (120 mg total) by mouth daily.   fluticasone (FLONASE) 50 MCG/ACT nasal spray Place 2 sprays into both nostrils daily.   furosemide (LASIX) 40 MG tablet Take 1 tablet (40 mg total) by mouth daily.   glipiZIDE (GLUCOTROL XL) 2.5 MG 24 hr tablet Take 1 tablet (2.5 mg total) by mouth daily with breakfast.   omeprazole (PRILOSEC) 40 MG capsule Take 1 capsule (40 mg total) by mouth daily.   potassium chloride (KLOR-CON) 10 MEQ tablet Take 1 tablet (10 mEq total) by mouth daily.   pregabalin (LYRICA) 25 MG capsule Take 1 capsule (25 mg total) by mouth 2 (two) times daily.   rivaroxaban (XARELTO) 20 MG TABS tablet Take 1 tablet (20 mg total) by mouth every evening with supper.   rivaroxaban (XARELTO) 20 MG TABS tablet Take 1 tablet (20 mg total) by mouth every evening with supper.   spironolactone (ALDACTONE) 25 MG  tablet Take 1 tablet (25 mg total) by mouth daily.   tamsulosin (FLOMAX) 0.4 MG CAPS capsule Take 1 capsule (0.4 mg total) by mouth daily.     Allergies:   Iodine, Iohexol, and Metformin and related   Social History   Socioeconomic History   Marital status: Widowed    Spouse name: Not on file   Number of children: 2   Years of education: Not on file   Highest education level: Not on file  Occupational History   Occupation: Retired    Associate Professor: [employer]    Comment: truck Air traffic controller: [employer]  Tobacco Use   Smoking status: Former    Current packs/day: 0.00    Average packs/day: 1 pack/day for 57.0 years (57.0 ttl pk-yrs)    Types: Cigarettes, Cigars    Start date: 08/07/1958    Quit date: 08/08/2015    Years since quitting: 7.9   Smokeless tobacco: Former    Types: Chew    Quit date: 11/04/1958   Tobacco comments:    Will smoke cigar every once in awhile.  Vaping daily started a couple weeks ago.  04/18/22 hfb  Vaping Use   Vaping status: Some Days  Substance and Sexual Activity   Alcohol use: Not Currently    Alcohol/week: 0.0 standard drinks of alcohol   Drug use: No   Sexual activity: Not Currently  Other Topics Concern   Not on file  Social History Narrative   Not on file   Social Determinants of Health   Financial Resource Strain: Low Risk  (05/22/2023)   Overall Financial Resource Strain (CARDIA)    Difficulty of Paying Living Expenses: Not very hard  Food Insecurity: No Food Insecurity (05/19/2023)   Hunger Vital Sign    Worried About Running Out of Food in the Last Year: Never true    Ran Out of Food in the Last Year: Never true  Transportation Needs: No Transportation Needs (05/19/2023)   PRAPARE - Transportation    Lack of Transportation (Medical): No    Lack of  Transportation (Non-Medical): No  Physical Activity: Insufficiently Active (08/05/2021)   Exercise Vital Sign    Days of Exercise per Week: 2 days    Minutes of Exercise per Session:  40 min  Stress: Stress Concern Present (05/22/2023)   [employer] of Occupational Health - Occupational Stress Questionnaire    Feeling of Stress : Rather much  Social Connections: Moderately Isolated (05/13/2022)   Social Connection and Isolation Panel [NHANES]    Frequency of Communication with Friends and Family: Twice a week    Frequency of Social Gatherings with Friends and Family: Once a week    Attends Religious Services: Never    Database administrator or Organizations: No    Attends Engineer, structural: Never    Marital Status: Married     Family History: The patient's family history includes Atrial fibrillation in his son; Emphysema in his father; Learning disabilities in his son; Leukemia in his father and another family member; Stroke in an other family member. ROS:   Please see the history of present illness.     All other systems reviewed and are negative.  EKGs/Labs/Other Studies Reviewed:    The following studies were reviewed today:   EKG:   EKG Interpretation Date/Time:  Monday August 04 2023 11:48:53 EDT Ventricular Rate:  172 PR Interval:    QRS Duration:  98 QT Interval:  288 QTC Calculation: 487 R Axis:   -41  Text Interpretation: Atrial flutter with 2:1 A-V conduction Left axis deviation ST & T wave abnormality, consider lateral ischemia When compared with ECG of 22-Jul-2023 13:47, ventricular rate is faster since previous ECG Confirmed by Kristeen Miss ([id number]) on 08/04/2023 12:00:10 PM    Recent Labs: 04/02/2023: TSH 2.190 07/22/2023: ALT 16 07/25/2023: Brain Natriuretic Peptide 63 07/29/2023: BUN 29; Creatinine, Ser 0.96; Hemoglobin 12.2; Magnesium 1.9; Platelets 307.0; Potassium 4.4; Sodium 139  Recent Lipid Panel    Component Value Date/Time   CHOL 127 10/07/2022 1128   TRIG 93 10/07/2022 1128   HDL 64 10/07/2022 1128   CHOLHDL 2.0 10/07/2022 1128   CHOLHDL 2 07/13/2021 1336   VLDL 14.4 07/13/2021 1336   LDLCALC 46 10/07/2022  1128   LDLCALC 62 04/11/2021 1421   LDLDIRECT 142.2 12/26/2008 1032    Physical Exam:    Physical Exam: Blood pressure 126/60, pulse (!) 173, height 5\' 8"  (1.727 m), weight 174 lb (78.9 kg), SpO2 95%.     GEN:  Well nourished, well developed in no acute distress HEENT: Normal NECK: No JVD; No carotid bruits LYMPHATICS: No lymphadenopathy CARDIAC: RRR RR tachy , no murmurs, rubs, gallops RESPIRATORY:  bilateral wheezing,  L>R ABDOMEN: Soft, non-tender, non-distended MUSCULOSKELETAL:  2 + pitting edema No deformity  SKIN: Warm and dry NEUROLOGIC:  Alert and oriented x 3    ASSESSMENT:    1. Essential hypertension   2. PAF (paroxysmal atrial fibrillation) (HCC)       PLAN:      1.  Paroxysmal atrial fibrillation:    Now presents with atrial fibrillation with a rapid ventricular response. He is actively wheezing.  It is difficult to know whether this is all due to congestive heart failure or perhaps as a COPD exacerbation.  His heart rate is 178.  At this point we do not have any options to quickly slow his heart rate down and make him feel better.  Will we need to send him to the emergency room to be admitted by the medicine team.  Cardiology  will see him in consultation.     2. Hyperlipidemia:      3. Lung cancer:    4.  CAD :  s/p CABG ;   no angina.  Long time smoker.  He has significant COPD.  5.  Wheezing :  / pseudowheezing .     6.   acute shortness of breath.  Possibilities include COPD exacerbation, congestive heart failure, rapid atrial fibrillation.  He has been on long-term anticoagulation -I would suspect that it is a low likelihood that he has a pulmonary embolus but this may need to be considered if he does not respond to correction of the above-noted issues.

## 2023-08-04 NOTE — H&P (Signed)
History and Physical    Robert Bates IHK:[id number] DOB: 12/01/1940 DOA: 08/04/2023  PCP: Sandford Craze, NP  Patient coming from: Home  I have personally briefly reviewed patient's old medical records in [facility]  Chief Complaint: Shortness of breath  HPI: Robert Bates is a 82 y.o. 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  Patient was seen in pulmonology clinic 9/20 for acute visit due to shortness of breath and cough.  He was started on treatment for COPD flareup.  He was given albuterol and Depo-Medrol 80 mg in the clinic and sent home on a prednisone taper.  He had already been on Augmentin for treatment of diverticulitis which was continued.  He was seen in PCP clinic 9/24 for follow-up.  He was noted to be tachycardic.  EKG confirmed A-fib with heart rate 127.  He was started on diltiazem 120 mg daily and recommended to continue his home amiodarone and Xarelto.  Seen again in PCP for follow-up 9/27 and felt to be volume overloaded.  Home Lasix was increased to 40 mg a.m. and 20 mg p.m. x 3 days with plan to return to 40 mg daily afterwards.  He had follow-up with cardiology today 9/30.  He presented with A-fib RVR, shortness of breath with active wheezing.  Heart rate was 178.  He was sent to the ED for further evaluation.  Patient states that breathing seems to be improved after receiving nebulizer treatment and IV steroids while in the ED.  Reports lower extremity edema has been ongoing for the last 1-1/2 weeks.  Not really improved despite increase in Lasix over the last few days.  He denies any cough.  He reports adherence to Xarelto denies obvious bleeding.  He denies chest pain.  ED Course   Labs/Imaging on admission: I have personally reviewed following labs and imaging studies.  Initial vitals showed BP 109/78, pulse 107, RR 19, temp 97.9 F, SpO2 91% on aerosol mask.  Labs show WBC 13.4, hemoglobin 12.6, platelets 291,000, sodium 137, potassium 3.9, bicarb 23, BUN 27, creatinine 0.95, serum glucose 127, BNP 134.1.  SARS-CoV-2, influenza, RSV negative.  Portable chest x-ray shows small left pleural effusion, similar or slightly decreased compared to previous.  Patient was given IV Solu-Medrol 125 mg, IV magnesium 2 g, IV diltiazem 10 mg, DuoNeb treatment, continuous albuterol nebulizer.  The hospitalist service was consulted to admit for further evaluation and management.  Review of Systems: All systems reviewed and are negative except as documented in history of present illness above.   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   COPD (chronic obstructive pulmonary disease) (HCC)    Coronary artery disease    Diabetes type 2, controlled (HCC) 07/31/2017   Diverticulitis 07/29/2023   Dyspnea    Dysrhythmia    a fib   GERD (gastroesophageal reflux disease)    Hematuria    refuses work up or referral - understands risks of morbidity / mortality - 11/2008, 12/2008  Heme positive stool    History of hiatal hernia    History of kidney stones    Hyperlipemia    Meningioma (HCC) 10/25/2013   Follows with Dr. Coletta Memos.    Peripheral vascular disease (HCC)    Abdominal Aortic Aneursym   Pneumonia    as a child   Radiation 09/18/15-10/25/15   left lower lobe 70.2 Gy   Seizures (HCC) 02/18/2020   Tobacco abuse     Past Surgical History:  Procedure Laterality Date   CHOLECYSTECTOMY N/A 07/23/2017    Procedure: LAPAROSCOPIC CHOLECYSTECTOMY;  Surgeon: Rodman Pickle, MD;  Location: [facility];  Service: General;  Laterality: N/A;   CLIPPING OF ATRIAL APPENDAGE Left 05/08/2021   Procedure: CLIPPING OF ATRIAL APPENDAGE USING 45 ATRICLIP;  Surgeon: Linden Dolin, MD;  Location: [facility];  Service: Open Heart Surgery;  Laterality: Left;   COLONOSCOPY     CORONARY ARTERY BYPASS GRAFT N/A 05/08/2021   Procedure: CORONARY ARTERY BYPASS GRAFTING (CABG)X 3 USING LEFT INTERNAL MAMMARY ARTERY AND RIGHT GREATER SAPEHNOUS VEIN;  Surgeon: Linden Dolin, MD;  Location: [facility] OR;  Service: Open Heart Surgery;  Laterality: N/A;   ENDOVEIN HARVEST OF GREATER SAPHENOUS VEIN Right 05/08/2021   Procedure: ENDOVEIN HARVEST OF GREATER SAPHENOUS VEIN;  Surgeon: Linden Dolin, MD;  Location: [facility];  Service: Open Heart Surgery;  Laterality: Right;   EYE SURGERY Bilateral    Cataracts removed w/ lens implant   HERNIA REPAIR     Left 36 years ago . Right inguinal hernia repair 10-01-17 Dr. Sheliah Hatch   INGUINAL HERNIA REPAIR Right 10/01/2017   Procedure: RIGHT INGUINAL HERNIA REPAIR WITH MESH;  Surgeon: Kinsinger, De Blanch, MD;  Location: [facility];  Service: General;  Laterality: Right;  TAP BLOCK   INSERTION OF MESH Right 10/01/2017   Procedure: INSERTION OF MESH;  Surgeon: Sheliah Hatch De Blanch, MD;  Location: [facility];  Service: General;  Laterality: Right;   IR THORACENTESIS ASP PLEURAL SPACE W/IMG GUIDE  05/18/2021   IR THORACENTESIS ASP PLEURAL SPACE W/IMG GUIDE  06/07/2021   LEFT HEART CATH AND CORONARY ANGIOGRAPHY N/A 04/20/2021   Procedure: LEFT HEART CATH AND CORONARY ANGIOGRAPHY;  Surgeon: Iran Ouch, MD;  Location: [facility];  Service: Cardiovascular;  Laterality: N/A;   TEE WITHOUT CARDIOVERSION N/A 05/08/2021   Procedure: TRANSESOPHAGEAL ECHOCARDIOGRAM (TEE);  Surgeon: Linden Dolin, MD;  Location: [facility] OR;  Service: Open Heart Surgery;  Laterality: N/A;   TONSILLECTOMY     TONSILLECTOMY      VIDEO BRONCHOSCOPY Bilateral 07/26/2015   Procedure: VIDEO BRONCHOSCOPY WITH FLUORO;  Surgeon: Nyoka Cowden, MD;  Location: [facility];  Service: Cardiopulmonary;  Laterality: Bilateral;   VIDEO BRONCHOSCOPY WITH ENDOBRONCHIAL NAVIGATION N/A 08/23/2015   Procedure: VIDEO BRONCHOSCOPY WITH ENDOBRONCHIAL NAVIGATION;  Surgeon: Delight Ovens, MD;  Location: [facility];  Service: Thoracic;  Laterality: N/A;   VIDEO BRONCHOSCOPY WITH ENDOBRONCHIAL ULTRASOUND N/A 08/23/2015   Procedure: VIDEO BRONCHOSCOPY WITH ENDOBRONCHIAL ULTRASOUND;  Surgeon: Delight Ovens, MD;  Location: [facility];  Service: Thoracic;  Laterality: N/A;    Social History:  reports that he quit smoking about 7 years ago. His smoking use included cigarettes and cigars. He started smoking about 65 years ago. He has a 57 pack-year smoking history. He quit smokeless tobacco use about 64 years ago.  His smokeless tobacco use included chew. He reports that he does not currently use alcohol. He reports that he does not use drugs.  Allergies  Allergen Reactions   Iodine Swelling    Neck, gland swelling   Iohexol Swelling    Neck, gland swelling   Metformin And Related Diarrhea    Family History  Problem Relation Age of Onset   Leukemia Father    Emphysema Father    Learning disabilities Son    Atrial fibrillation Son    Leukemia Other    Stroke Other      Prior to Admission medications   Medication Sig Start Date End Date Taking? Authorizing Provider  acetaminophen (TYLENOL) 500 MG tablet Take 1-2 tablets (500-1,000 mg total) by mouth every 6 (six) hours as needed. Patient taking differently: Take 1,000 mg by mouth daily as needed for headache, fever or moderate pain. 05/16/21   Ardelle Balls, PA-C  albuterol (PROVENTIL) (2.5 MG/3ML) 0.083% nebulizer solution Take 3 mLs (2.5 mg total) by nebulization every 6 (six) hours as needed for wheezing or shortness of breath. 10/17/21   Charlott Holler, MD  amiodarone  (PACERONE) 200 MG tablet Take 1/2 tablet (100mg ) by mouth three days per week (M,W,F) Patient taking differently: Take 100 mg by mouth every Monday, Wednesday, and Friday. 10/07/22   Nahser, Deloris Ping, MD  amoxicillin-clavulanate (AUGMENTIN) 875-125 MG tablet Take 1 tablet by mouth every 12 (twelve) hours. 07/22/23   Glyn Ade, MD  ASCORBIC ACID PO Take 1 tablet by mouth daily. Vitamin C, unknown strength.    [provider]  atorvastatin (LIPITOR) 80 MG tablet Take 1 tablet (80 mg total) by mouth daily. 01/17/23   Nahser, Deloris Ping, MD  benzonatate (TESSALON) 100 MG capsule Take 1 capsule (100 mg total) by mouth 3 (three) times daily as needed. 08/01/23   Sandford Craze, NP  Budeson-Glycopyrrol-Formoterol (BREZTRI AEROSPHERE) 160-9-4.8 MCG/ACT AERO Inhale 2 puffs into the lungs in the morning and at bedtime. Patient taking differently: Inhale 2 puffs into the lungs 2 (two) times daily as needed (wheezing, shortness of breath). 10/17/21   Charlott Holler, MD  diltiazem (CARDIZEM LA) 120 MG 24 hr tablet Take 1 tablet (120 mg total) by mouth daily. 07/29/23   Sandford Craze, NP  fluticasone (FLONASE) 50 MCG/ACT nasal spray Place 2 sprays into both nostrils daily. 09/04/22   Saguier, Ramon Dredge, PA-C  furosemide (LASIX) 40 MG tablet Take 1 tablet (40 mg total) by mouth daily. 05/20/23   Sandford Craze, NP  glipiZIDE (GLUCOTROL XL) 2.5 MG 24 hr tablet Take 1 tablet (2.5 mg total) by mouth daily with breakfast. 07/14/23   Sandford Craze, NP  omeprazole (PRILOSEC) 40 MG capsule Take 1 capsule (40 mg total) by mouth daily. 05/06/23   Sandford Craze, NP  potassium chloride (KLOR-CON) 10 MEQ tablet Take 1 tablet (10 mEq total) by mouth daily. 05/06/23   Tereso Newcomer T, PA-C  pregabalin (LYRICA) 25 MG capsule Take 1 capsule (25 mg total) by mouth 2 (two) times daily. 06/17/23   Sandford Craze, NP  rivaroxaban (XARELTO) 20 MG TABS tablet Take 1 tablet (20 mg total) by mouth every  evening with supper. 06/12/23   Nahser, Deloris Ping, MD  rivaroxaban (XARELTO) 20 MG TABS tablet Take 1 tablet (20 mg total) by mouth every evening with supper. 06/25/23   Eckard, Tammy, RPH-CPP  spironolactone (ALDACTONE) 25 MG tablet Take 1 tablet (25 mg total) by mouth daily. 05/09/23   Sandford Craze, NP  tamsulosin (FLOMAX) 0.4 MG CAPS capsule Take 1 capsule (0.4 mg total) by mouth daily. 05/09/23 05/08/24  Sandford Craze, NP  Physical Exam: Vitals:   08/04/23 1714 08/04/23 1834 08/04/23 1845 08/04/23 1900  BP:   (!) 109/58 111/63  Pulse:  (!) 163 (!) 56 (!) 112  Resp:  20 (!) 30 (!) 24  Temp: 98 F (36.7 C)     TempSrc:      SpO2:   92% 97%   Constitutional: Resting in bed with head elevated, NAD, calm, comfortable Eyes: EOMI, lids and conjunctivae normal ENMT: Mucous membranes are moist. Posterior pharynx clear of any exudate or lesions.Normal dentition.  Neck: normal, supple, no masses. Respiratory: End expiratory wheezing bilaterally. Normal respiratory effort. No accessory muscle use.  Cardiovascular: Irregularly irregular, no murmurs / rubs / gallops.  +1 bilateral lower extremity edema. 2+ pedal pulses. Abdomen: no tenderness, no masses palpated.  Musculoskeletal: no clubbing / cyanosis. No joint deformity upper and lower extremities. Good ROM, no contractures. Normal muscle tone.  Skin: no rashes, lesions, ulcers. No induration Neurologic:  Sensation intact. Strength 5/5 in all 4.  Psychiatric: Alert and oriented x 3. Normal mood.   EKG: Personally reviewed.  Initial EKG in cardiology clinic showed atrial flutter, rate 172, 2:1 conduction.  Initial EKG in the ED showed A-fib rate 85.  Repeat EKG after diltiazem showed sinus rhythm, rate 76, multiple PACs/PVCs.  Assessment/Plan Principal Problem:   COPD with acute exacerbation (HCC) Active Problems:   Paroxysmal atrial fibrillation with RVR (HCC)   Chronic heart failure with preserved ejection fraction (HFpEF) (HCC)    Diabetes type 2, controlled (HCC)   Coronary artery disease   Robert Bates is a 82 y.o. 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  Assessment and Plan: COPD with acute exacerbation: Failed outpatient management.  Symptoms improving with IV steroids and nebulizers administered in the ED but still with wheezing on admission. -Brovana/Pulmicort BID, Xopenex prn -Continue prednisone tomorrow -Supplemental O2 as needed  Paroxysmal A-fib/flutter with RVR: Found to be in AF RVR with rate 172 in cardiology clinic.  Rates improving with IV diltiazem 10 mg given in the ED but still in and out of A-fib. -Cardiology to follow -Continue diltiazem 120 mg daily -Continue Xarelto -Continue amiodarone 100 mg MWF  Chronic HFpEF: Last EF 55-60%.  Has some increased swelling to lower extremities and small stable left pleural effusion on CXR.  Not hypoxic. -Continue oral Lasix 40 mg daily -Continue spironolactone 25 mg daily -Not on SGLT2i due to history of yeast infection -History of bradycardia with beta-blocker per prior documentation  CAD s/p CABG: Stable, denies chest pain.  Continue Xarelto, atorvastatin.  Type 2 diabetes: Hold home meds and placed on SSI.  Chronic left pleural effusion: Small, stable on CXR.  BPH: Continue Flomax.  Meningioma: Follows with neurosurgery Dr. Franky Macho.  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  Follows with oncology Dr. Arbutus Ped.   DVT prophylaxis: rivaroxaban (XARELTO) tablet 20 mg Start: 08/04/23 2000 rivaroxaban (XARELTO) tablet 20 mg   Code Status:   Code Status: Limited: Do not attempt resuscitation (DNR) -DNR-LIMITED -Do Not Intubate/DNI discussed with patient's niece and HCPOA Steward Drone Family  Communication: Niece Steward Drone by phone Disposition Plan: From home and likely discharge to home pending clinical progress Consults called: Cardiology Severity of Illness: The appropriate patient status for this patient is OBSERVATION. Observation status is judged to be  reasonable and necessary in order to provide the required intensity of service to ensure the patient's safety. The patient's presenting symptoms, physical exam findings, and initial radiographic and laboratory data in the context of their medical condition is felt to place them at decreased risk for further clinical deterioration. Furthermore, it is anticipated that the patient will be medically stable for discharge from the hospital within 2 midnights of admission.   Darreld Mclean MD [facility]  If 7PM-7AM, please contact night-coverage [email or web address]  08/04/2023, 8:00 PM

## 2023-08-05 ENCOUNTER — Other Ambulatory Visit: Payer: Self-pay

## 2023-08-05 ENCOUNTER — Ambulatory Visit: Payer: Medicare Other | Admitting: Podiatry

## 2023-08-05 DIAGNOSIS — I480 Paroxysmal atrial fibrillation: Secondary | ICD-10-CM | POA: Diagnosis not present

## 2023-08-05 DIAGNOSIS — I2583 Coronary atherosclerosis due to lipid rich plaque: Secondary | ICD-10-CM | POA: Diagnosis not present

## 2023-08-05 DIAGNOSIS — F17290 Nicotine dependence, other tobacco product, uncomplicated: Secondary | ICD-10-CM | POA: Diagnosis present

## 2023-08-05 DIAGNOSIS — E1151 Type 2 diabetes mellitus with diabetic peripheral angiopathy without gangrene: Secondary | ICD-10-CM | POA: Diagnosis present

## 2023-08-05 DIAGNOSIS — I110 Hypertensive heart disease with heart failure: Secondary | ICD-10-CM | POA: Diagnosis present

## 2023-08-05 DIAGNOSIS — J984 Other disorders of lung: Secondary | ICD-10-CM | POA: Diagnosis not present

## 2023-08-05 DIAGNOSIS — Z7189 Other specified counseling: Secondary | ICD-10-CM | POA: Diagnosis not present

## 2023-08-05 DIAGNOSIS — R0602 Shortness of breath: Secondary | ICD-10-CM | POA: Diagnosis not present

## 2023-08-05 DIAGNOSIS — M1990 Unspecified osteoarthritis, unspecified site: Secondary | ICD-10-CM | POA: Diagnosis present

## 2023-08-05 DIAGNOSIS — I5033 Acute on chronic diastolic (congestive) heart failure: Secondary | ICD-10-CM | POA: Diagnosis present

## 2023-08-05 DIAGNOSIS — Z85118 Personal history of other malignant neoplasm of bronchus and lung: Secondary | ICD-10-CM | POA: Diagnosis not present

## 2023-08-05 DIAGNOSIS — Z515 Encounter for palliative care: Secondary | ICD-10-CM

## 2023-08-05 DIAGNOSIS — J441 Chronic obstructive pulmonary disease with (acute) exacerbation: Secondary | ICD-10-CM | POA: Diagnosis not present

## 2023-08-05 DIAGNOSIS — K5732 Diverticulitis of large intestine without perforation or abscess without bleeding: Secondary | ICD-10-CM | POA: Diagnosis present

## 2023-08-05 DIAGNOSIS — Z806 Family history of leukemia: Secondary | ICD-10-CM | POA: Diagnosis not present

## 2023-08-05 DIAGNOSIS — I5032 Chronic diastolic (congestive) heart failure: Secondary | ICD-10-CM | POA: Diagnosis not present

## 2023-08-05 DIAGNOSIS — Z1152 Encounter for screening for COVID-19: Secondary | ICD-10-CM | POA: Diagnosis not present

## 2023-08-05 DIAGNOSIS — F1021 Alcohol dependence, in remission: Secondary | ICD-10-CM | POA: Diagnosis present

## 2023-08-05 DIAGNOSIS — I714 Abdominal aortic aneurysm, without rupture, unspecified: Secondary | ICD-10-CM | POA: Diagnosis present

## 2023-08-05 DIAGNOSIS — Z66 Do not resuscitate: Secondary | ICD-10-CM

## 2023-08-05 DIAGNOSIS — Z9049 Acquired absence of other specified parts of digestive tract: Secondary | ICD-10-CM | POA: Diagnosis not present

## 2023-08-05 DIAGNOSIS — I2510 Atherosclerotic heart disease of native coronary artery without angina pectoris: Secondary | ICD-10-CM | POA: Diagnosis not present

## 2023-08-05 DIAGNOSIS — I10 Essential (primary) hypertension: Secondary | ICD-10-CM

## 2023-08-05 DIAGNOSIS — I4891 Unspecified atrial fibrillation: Secondary | ICD-10-CM

## 2023-08-05 DIAGNOSIS — E119 Type 2 diabetes mellitus without complications: Secondary | ICD-10-CM | POA: Diagnosis not present

## 2023-08-05 DIAGNOSIS — R918 Other nonspecific abnormal finding of lung field: Secondary | ICD-10-CM | POA: Diagnosis not present

## 2023-08-05 DIAGNOSIS — N400 Enlarged prostate without lower urinary tract symptoms: Secondary | ICD-10-CM | POA: Diagnosis present

## 2023-08-05 DIAGNOSIS — E7800 Pure hypercholesterolemia, unspecified: Secondary | ICD-10-CM | POA: Diagnosis not present

## 2023-08-05 DIAGNOSIS — Z6826 Body mass index (BMI) 26.0-26.9, adult: Secondary | ICD-10-CM | POA: Diagnosis not present

## 2023-08-05 DIAGNOSIS — E663 Overweight: Secondary | ICD-10-CM | POA: Diagnosis present

## 2023-08-05 DIAGNOSIS — K219 Gastro-esophageal reflux disease without esophagitis: Secondary | ICD-10-CM | POA: Diagnosis present

## 2023-08-05 DIAGNOSIS — J90 Pleural effusion, not elsewhere classified: Secondary | ICD-10-CM | POA: Diagnosis not present

## 2023-08-05 DIAGNOSIS — I4892 Unspecified atrial flutter: Secondary | ICD-10-CM | POA: Diagnosis present

## 2023-08-05 DIAGNOSIS — Z825 Family history of asthma and other chronic lower respiratory diseases: Secondary | ICD-10-CM | POA: Diagnosis not present

## 2023-08-05 DIAGNOSIS — D329 Benign neoplasm of meninges, unspecified: Secondary | ICD-10-CM | POA: Diagnosis present

## 2023-08-05 LAB — CBG MONITORING, ED
Glucose-Capillary: 156 mg/dL — ABNORMAL HIGH (ref 70–99)
Glucose-Capillary: 161 mg/dL — ABNORMAL HIGH (ref 70–99)

## 2023-08-05 LAB — BASIC METABOLIC PANEL
Anion gap: 13 (ref 5–15)
BUN: 27 mg/dL — ABNORMAL HIGH (ref 8–23)
CO2: 23 mmol/L (ref 22–32)
Calcium: 8.5 mg/dL — ABNORMAL LOW (ref 8.9–10.3)
Chloride: 99 mmol/L (ref 98–111)
Creatinine, Ser: 1.09 mg/dL (ref 0.61–1.24)
GFR, Estimated: >60 mL/min (ref >60)
Glucose, Bld: 279 mg/dL — ABNORMAL HIGH (ref 70–99)
Potassium: 4.3 mmol/L (ref 3.5–5.1)
Sodium: 135 mmol/L (ref 135–145)

## 2023-08-05 LAB — CBC
HCT: 39.2 % (ref 39.0–52.0)
Hemoglobin: 12.2 g/dL — ABNORMAL LOW (ref 13.0–17.0)
MCH: 27.7 pg (ref 26.0–34.0)
MCHC: 31.1 g/dL (ref 30.0–36.0)
MCV: 89.1 fL (ref 80.0–100.0)
Platelets: 288 10*3/uL (ref 150–400)
RBC: 4.4 MIL/uL (ref 4.22–5.81)
RDW: 17 % — ABNORMAL HIGH (ref 11.5–15.5)
WBC: 14.6 10*3/uL — ABNORMAL HIGH (ref 4.0–10.5)
nRBC: 0 % (ref 0.0–0.2)

## 2023-08-05 LAB — GLUCOSE, CAPILLARY
Glucose-Capillary: 155 mg/dL — ABNORMAL HIGH (ref 70–99)
Glucose-Capillary: 155 mg/dL — ABNORMAL HIGH (ref 70–99)

## 2023-08-05 MED ORDER — DILTIAZEM HCL 25 MG/5ML IV SOLN
5.0000 mg | Freq: Once | INTRAVENOUS | Status: AC
  Administered 2023-08-05: 5 mg via INTRAVENOUS
  Filled 2023-08-05: qty 5

## 2023-08-05 MED ORDER — DILTIAZEM HCL 25 MG/5ML IV SOLN
20.0000 mg | Freq: Once | INTRAVENOUS | Status: DC
  Filled 2023-08-05: qty 5

## 2023-08-05 NOTE — Progress Notes (Signed)
   08/05/23 2009  Assess: MEWS Score  Temp 97.6 F (36.4 C)  BP 120/80  MAP (mmHg) 93  Pulse Rate (!) 111  ECG Heart Rate (!) 123  Resp 16  Level of Consciousness Alert  SpO2 95 %  O2 Device Room Air  Assess: MEWS Score  MEWS Temp 0  MEWS Systolic 0  MEWS Pulse 2  MEWS RR 0  MEWS LOC 0  MEWS Score 2  MEWS Score Color Yellow  Assess: if the MEWS score is Yellow or Red  Were vital signs accurate and taken at a resting state? Yes  Does the patient meet 2 or more of the SIRS criteria? No  MEWS guidelines implemented  Yes, yellow  Treat  MEWS Interventions Considered administering scheduled or prn medications/treatments as ordered  Take Vital Signs  Increase Vital Sign Frequency  Yellow: Q2hr x1, continue Q4hrs until patient remains green for 12hrs  Escalate  MEWS: Escalate Yellow: Discuss with charge nurse and consider notifying provider and/or RRT  Notify: Charge Nurse/RN  Name of Charge Nurse/RN Notified Therapist, nutritional  Assess: SIRS CRITERIA  SIRS Temperature  0  SIRS Pulse 1  SIRS Respirations  0  SIRS WBC 0  SIRS Score Sum  1

## 2023-08-05 NOTE — ED Notes (Signed)
Bed ordered for the patient.

## 2023-08-05 NOTE — ED Notes (Signed)
Assisted to w/c and up to the bathroom

## 2023-08-05 NOTE — Progress Notes (Signed)
   RN called because patient had gone back into rapid atrial fibrillation.  Heart rate is approximately 120.   He had an earlier order for Cardizem IV 20 mg and she wanted to know if she could give that now.  He had not previously received.  Current blood pressure is 114 systolic, I recommended that she give 5 mg IV and follow that in 30 minutes with another 5 mg if he meets the blood pressure and heart rate parameters I put in the order.  Continue to follow on telemetry and monitor for symptoms.  Theodore Demark, PA-C 08/05/2023 7:18 PM

## 2023-08-05 NOTE — Progress Notes (Addendum)
Progress Note  Patient Name: Robert Bates Date of Encounter: 08/05/2023  [facility] Cardiologist: Kristeen Miss, MD   Subjective   Patient has a history of COPD, PAF on Xarelto and CAD status post remote CABG as well as chronic HFpEF.  Also has a history of chronic left pleural effusion and squamous cell lung cancer status post curative radiotherapy, diabetes 2, hypertension, hyperlipidemia and meningioma.  He had a flareup of COPD on 07/25/2023 treated with steroids and albuterol.  Found to be back in A-fib with RVR on 924 by PCP and started on Cardizem CD 120 mg daily along with his Amio and Xarelto that he was already on.  Seen back on 927 and noted to have volume overload and Lasix was increased to 40 mg every morning and 20 mg every 8 PM x 3 days and then back to 40 mg daily.  Seen by cardiology yesterday and was in A-fib with RVR along with shortness of breath and active wheezing with a heart rate of 178 bpm.    He was sent to the ER her heart rate was 107 bpm with blood pressure 109/78 mmHg.  BNP minimally elevated at 134.  Chest x-ray showed small left pleural effusion similar or slightly decreased from prior.  Diagnosed with acute COPD exacerbation and in the ER given IV Solu-Medrol IV mag and started on IV dill drip.  Now off Cardizem drip and on Cardizem CD 120 mg daily along with amiodarone 100 mg Monday Wednesday and Friday which is his normal home dose.  He tells me he has not missed any doses of Xarelto in the past 4 weeks.  Nuys any chest pain or pressure but has chronic shortness of breath from COPD.  He also has had some lower extremity edema.  Currently on Lasix 40 mg daily and spironolactone 25 mg daily.  No I's and O's recorded in the ER.  Inpatient Medications    Scheduled Meds:  amiodarone  100 mg Oral Q M,W,F   arformoterol  15 mcg Nebulization BID   atorvastatin  80 mg Oral Daily   budesonide (PULMICORT) nebulizer solution  0.25 mg Nebulization BID    diltiazem  120 mg Oral Daily   diltiazem  20 mg Intravenous Once   furosemide  40 mg Oral Daily   insulin aspart  0-9 Units Subcutaneous TID WC   potassium chloride  10 mEq Oral Daily   predniSONE  40 mg Oral Q breakfast   rivaroxaban  20 mg Oral QPM   sodium chloride flush  3 mL Intravenous Q12H   spironolactone  25 mg Oral Daily   tamsulosin  0.4 mg Oral Daily   Continuous Infusions:  PRN Meds: acetaminophen **OR** acetaminophen, levalbuterol, metoprolol tartrate, ondansetron **OR** ondansetron (ZOFRAN) IV, senna-docusate   Vital Signs    Vitals:   08/05/23 1050 08/05/23 1110 08/05/23 1200 08/05/23 1214  BP:   (!) 109/59   Pulse: (!) 28 98 (!) 46   Resp: 17 15 (!) 23   Temp:    98.1 F (36.7 C)  TempSrc:    Oral  SpO2: 96% 96% 95%   Weight:      Height:       No intake or output data in the 24 hours ending 08/05/23 1305    08/05/2023    2:30 AM 08/04/2023   11:50 AM 08/01/2023    3:03 PM  Last 3 Weights  Weight (lbs) 176 lb 174 lb 177 lb  Weight (kg)  79.833 kg 78.926 kg 80.287 kg      Telemetry    NSR on tele and EKG with LAFB Personally Reviewed  ECG    No new EKG- Personally Reviewed  Physical Exam   GEN: No acute distress.   Neck: No JVD Cardiac: RRR, no murmurs, rubs, or gallops.  Respiratory: Diffuse expiratory wheezes GI: Soft, nontender, non-distended  MS: +2 bilateral lower extremity pitting edema edema; No deformity. Neuro:  Nonfocal  Psych: Normal affect   Labs    High Sensitivity Troponin:  No results for input(s): "TROPONINIHS" in the last 720 hours.    Chemistry Recent Labs  Lab 08/04/23 1328 08/04/23 1344 08/05/23 0231  NA 137 135 135  K 3.9 3.8 4.3  CL 102  --  99  CO2 23  --  23  GLUCOSE 127*  --  279*  BUN 27*  --  27*  CREATININE 0.95  --  1.09  CALCIUM 8.9  --  8.5*  GFRNONAA >60  --  >60  ANIONGAP 12  --  13     Hematology Recent Labs  Lab 08/04/23 1328 08/04/23 1344 08/05/23 0231  WBC 13.4*  --  14.6*  RBC  4.65  --  4.40  HGB 12.6* 13.6 12.2*  HCT 41.1 40.0 39.2  MCV 88.4  --  89.1  MCH 27.1  --  27.7  MCHC 30.7  --  31.1  RDW 17.1*  --  17.0*  PLT 291  --  288    BNP Recent Labs  Lab 08/04/23 1328  BNP 134.1*     DDimer No results for input(s): "DDIMER" in the last 168 hours.   CHA2DS2-VASc Score = 6   This indicates a 9.7% annual risk of stroke. The patient's score is based upon: CHF History: 1 HTN History: 1 Diabetes History: 1 Stroke History: 0 Vascular Disease History: 1 Age Score: 2 Gender Score: 0   Radiology    DG Chest Port 1 View  Result Date: 08/04/2023 CLINICAL DATA:  Shortness of breath.  Chest pain. EXAM: PORTABLE CHEST 1 VIEW COMPARISON:  Chest radiograph dated 07/25/2023. FINDINGS: Similar appearance of scarring at the left lung base and left perihilar region small left pleural effusion, similar or slightly decreased. No pneumothorax. Stable cardiac silhouette. Median sternotomy wires and CABG vascular clips. Left atrial appendage occlusion device. No acute osseous pathology. IMPRESSION: Small left pleural effusion, similar or slightly decreased. Electronically Signed   By: Elgie Collard M.D.   On: 08/04/2023 16:35    Patient Profile     82 y.o. male Robert Bates is a 82 y.o. 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  Assessment & Plan    COPD with acute exacerbation: Failed outpatient management.  Symptoms improving with IV steroids and nebulizers administered in the ED  -Shortness of breath improved but still has wheezing on exam -Currently on Brovana/Pulmicort BID, Xopenex prn>> avoid albuterol given atrial fibrillation -Starting prednisone tomorrow -Supplemental  O2 as needed   Paroxysmal A-fib/flutter with RVR: Found to be in AF RVR with rate 172 in cardiology clinic.   -Suspect acute COPD exacerbation was the trigger for recurrent A-fib -Started on IV Cardizem in the ER with improved heart rate and now back on  Cardizem CD 120 mg daily  -Now back in NSR on EKG -Continue amiodarone 100 mg Monday Wednesday and Friday -continuee Cardizem CD 120 mg daily as BP allows -continue Xarelto 20 mg daily   Acute on Chronic HFpEF: Last EF 55-60%.  Has some increased swelling to lower extremities and small stable left pleural effusion on CXR.  Not hypoxic. -I's and O's not recorded in the ER  -He has 2+ bilateral lower extremity pitting edema on exam so recommend at least 24 hours of IV Lasix.  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  Follows with oncology Dr. Arbutus Ped.  Hyperlipidemia -LDL goal < 70 -check FLP in am -Continue atorvastatin 80 mg daily  Hypertension -BP currently soft  -may need to stop Cardizem     Total time spent with patient today 40 minutes. This includes reviewing records, evaluating the patient and coordinating care. Face-to-face time >50%.  For questions or updates, please contact  [facility] Please consult [email or web address] for contact info under        Signed, Armanda Magic, MD  08/05/2023, 1:05 PM

## 2023-08-05 NOTE — ED Notes (Signed)
Up with wheelchair to the bathroom

## 2023-08-05 NOTE — ED Notes (Signed)
Shift report received from [facility], assumed care of patient at this time.

## 2023-08-05 NOTE — ED Notes (Signed)
ED TO INPATIENT HANDOFF REPORT  ED Nurse Name and Phone #: [phone]  S Name/Age/Gender Robert Bates 82 y.o. male Room/Bed: 004C/004C  Code Status   Code Status: Limited: Do not attempt resuscitation (DNR) -DNR-LIMITED -Do Not Intubate/DNI   Home/SNF/Other Home Patient oriented to: self, place, time, and situation Is this baseline? Yes   Triage Complete: Triage complete  Chief Complaint COPD with acute exacerbation (HCC) [J44.1]  Triage Note Pt BIBEMS w cc of SOB. [facility] found to be in aflutter w hr 100-165, wheezing noted and received duoneb x1 from EMS pta.   Allergies Allergies  Allergen Reactions   Iodine Swelling    Neck, gland swelling   Iohexol Swelling    Neck, gland swelling   Metformin And Related Diarrhea    Level of Care/Admitting Diagnosis ED Disposition     ED Disposition  Admit   Condition  --   Comment  Hospital Area: [facility] [[id number]]  Level of Care: Telemetry Cardiac [[id number]]  May place patient in observation at [facility] or [facility] if equivalent level of care is available:: No  Covid Evaluation: Confirmed COVID Negative  Diagnosis: COPD with acute exacerbation [facility]) [[id number]]  Admitting Physician: Charlsie Quest [[id number]]  Attending Physician: Charlsie Quest [[id number]]          B Medical/Surgery History Past Medical History:  Diagnosis Date   Anxiety    Aortic dilatation (HCC) 05/13/2022   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   COPD (chronic obstructive pulmonary disease) (HCC)    Coronary artery disease    Diabetes type 2, controlled (HCC) 07/31/2017   Diverticulitis 07/29/2023   Dyspnea    Dysrhythmia    a fib   GERD (gastroesophageal reflux disease)    Hematuria    refuses work up or referral - understands risks  of morbidity / mortality - 11/2008, 12/2008   Heme positive stool    History of hiatal hernia    History of kidney stones    Hyperlipemia    Meningioma (HCC) 10/25/2013   Follows with Dr. Coletta Memos.    Peripheral vascular disease (HCC)    Abdominal Aortic Aneursym   Pneumonia    as a child   Radiation 09/18/15-10/25/15   left lower lobe 70.2 Gy   Seizures (HCC) 02/18/2020   Tobacco abuse    Past Surgical History:  Procedure Laterality Date   CHOLECYSTECTOMY N/A 07/23/2017   Procedure: LAPAROSCOPIC CHOLECYSTECTOMY;  Surgeon: Rodman Pickle, MD;  Location: [facility] ORS;  Service: General;  Laterality: N/A;   CLIPPING OF ATRIAL APPENDAGE Left 05/08/2021   Procedure: CLIPPING OF ATRIAL APPENDAGE USING 45 ATRICLIP;  Surgeon: Linden Dolin, MD;  Location: [facility] OR;  Service: Open Heart Surgery;  Laterality: Left;   COLONOSCOPY     CORONARY ARTERY BYPASS GRAFT N/A 05/08/2021   Procedure: CORONARY ARTERY BYPASS GRAFTING (CABG)X 3 USING LEFT INTERNAL MAMMARY ARTERY AND RIGHT GREATER SAPEHNOUS VEIN;  Surgeon: Linden Dolin, MD;  Location: [facility] OR;  Service: Open Heart Surgery;  Laterality: N/A;   ENDOVEIN HARVEST OF GREATER SAPHENOUS VEIN Right 05/08/2021   Procedure: ENDOVEIN HARVEST OF GREATER SAPHENOUS VEIN;  Surgeon: Linden Dolin, MD;  Location: [facility] OR;  Service: Open Heart Surgery;  Laterality: Right;   EYE  SURGERY Bilateral    Cataracts removed w/ lens implant   HERNIA REPAIR     Left 36 years ago . Right inguinal hernia repair 10-01-17 Dr. Sheliah Hatch   INGUINAL HERNIA REPAIR Right 10/01/2017   Procedure: RIGHT INGUINAL HERNIA REPAIR WITH MESH;  Surgeon: Kinsinger, De Blanch, MD;  Location: [facility];  Service: General;  Laterality: Right;  TAP BLOCK   INSERTION OF MESH Right 10/01/2017   Procedure: INSERTION OF MESH;  Surgeon: Sheliah Hatch De Blanch, MD;  Location: [facility];  Service: General;  Laterality: Right;   IR THORACENTESIS ASP PLEURAL SPACE W/IMG GUIDE  05/18/2021   IR  THORACENTESIS ASP PLEURAL SPACE W/IMG GUIDE  06/07/2021   LEFT HEART CATH AND CORONARY ANGIOGRAPHY N/A 04/20/2021   Procedure: LEFT HEART CATH AND CORONARY ANGIOGRAPHY;  Surgeon: Iran Ouch, MD;  [facility];  Service: Cardiovascular;  Laterality: N/A;   TEE WITHOUT CARDIOVERSION N/A 05/08/2021   Procedure: TRANSESOPHAGEAL ECHOCARDIOGRAM (TEE);  Surgeon: Linden Dolin, MD;  Location: [facility] OR;  Service: Open Heart Surgery;  Laterality: N/A;   TONSILLECTOMY     TONSILLECTOMY     VIDEO BRONCHOSCOPY Bilateral 07/26/2015   Procedure: VIDEO BRONCHOSCOPY WITH FLUORO;  Surgeon: Nyoka Cowden, MD;  [facility];  Service: Cardiopulmonary;  Laterality: Bilateral;   VIDEO BRONCHOSCOPY WITH ENDOBRONCHIAL NAVIGATION N/A 08/23/2015   Procedure: VIDEO BRONCHOSCOPY WITH ENDOBRONCHIAL NAVIGATION;  Surgeon: Delight Ovens, MD;  [facility];  Service: Thoracic;  Laterality: N/A;   VIDEO BRONCHOSCOPY WITH ENDOBRONCHIAL ULTRASOUND N/A 08/23/2015   Procedure: VIDEO BRONCHOSCOPY WITH ENDOBRONCHIAL ULTRASOUND;  Surgeon: Delight Ovens, MD;  Location[facility];  Service: Thoracic;  Laterality: N/A;     A IV Location/Drains/Wounds Patient Lines/Drains/Airways Status     Active Line/Drains/Airways     Name Placement date Placement time Site Days   Peripheral IV 08/04/23 20 G Distal;Posterior;Right Forearm 08/04/23  2236  Forearm  1   Incision - 4 Ports Abdomen 1: Umbilicus 2: Mid;Upper 3: Right;Lateral;Upper 4: Right;Lateral;Mid 07/23/17  1313  -- 2204            Intake/Output Last 24 hours No intake or output data in the 24 hours ending 08/05/23 1404  Labs/Imaging Results for orders placed or performed during the hospital encounter of 08/04/23 (from the past 48 hour(s))  Resp panel by RT-PCR (RSV, Flu A&B, Covid) Anterior Nasal Swab     Status: None   Collection Time: 08/04/23  1:14 PM   Specimen: Anterior Nasal Swab  Result Value Ref Range   SARS Coronavirus 2 by  RT PCR NEGATIVE NEGATIVE   Influenza A by PCR NEGATIVE NEGATIVE   Influenza B by PCR NEGATIVE NEGATIVE    Comment: (NOTE) The Xpert Xpress SARS-CoV-2/FLU/RSV plus assay is intended as an aid in the diagnosis of influenza from Nasopharyngeal swab specimens and should not be used as a sole basis for treatment. Nasal washings and aspirates are unacceptable for Xpert Xpress SARS-CoV-2/FLU/RSV testing.  Fact Sheet for Patients: BloggerCourse.com  Fact Sheet for Healthcare Providers: SeriousBroker.it  This test is not yet approved or cleared by the [place] FDA and has been authorized for detection and/or diagnosis of SARS-CoV-2 by FDA under an Emergency Use Authorization (EUA). This EUA will remain in effect (meaning this test can be used) for the duration of the COVID-19 declaration under Section 564(b)(1) of the Act, 21 U.S.C. section 360bbb-3(b)(1), unless the authorization is terminated or revoked.     Resp Syncytial Virus by  PCR NEGATIVE NEGATIVE    Comment: (NOTE) Fact Sheet for Patients: BloggerCourse.com  Fact Sheet for Healthcare Providers: SeriousBroker.it  This test is not yet approved or cleared by the [place] FDA and has been authorized for detection and/or diagnosis of SARS-CoV-2 by FDA under an Emergency Use Authorization (EUA). This EUA will remain in effect (meaning this test can be used) for the duration of the COVID-19 declaration under Section 564(b)(1) of the Act, 21 U.S.C. section 360bbb-3(b)(1), unless the authorization is terminated or revoked.  Performed at [facility], [address] [place], [place] [address]   CBC     Status: Abnormal   Collection Time: 08/04/23  1:28 PM  Result Value Ref Range   WBC 13.4 (H) 4.0 - 10.5 K/uL   RBC 4.65 4.22 - 5.81 MIL/uL   Hemoglobin 12.6 (L) 13.0 - 17.0 g/dL   HCT 60.4 54.0 - 98.1 %   MCV 88.4  80.0 - 100.0 fL   MCH 27.1 26.0 - 34.0 pg   MCHC 30.7 30.0 - 36.0 g/dL   RDW 19.1 (H) 47.8 - 29.5 %   Platelets 291 150 - 400 K/uL   nRBC 0.0 0.0 - 0.2 %    Comment: Performed at [facility], [address] [place], [place] [address]  Basic metabolic panel     Status: Abnormal   Collection Time: 08/04/23  1:28 PM  Result Value Ref Range   Sodium 137 135 - 145 mmol/L   Potassium 3.9 3.5 - 5.1 mmol/L   Chloride 102 98 - 111 mmol/L   CO2 23 22 - 32 mmol/L   Glucose, Bld 127 (H) 70 - 99 mg/dL    Comment: Glucose reference range applies only to samples taken after fasting for at least 8 hours.   BUN 27 (H) 8 - 23 mg/dL   Creatinine, Ser 8.65 0.61 - 1.24 mg/dL   Calcium 8.9 8.9 - 78.4 mg/dL   GFR, Estimated >69 >62 mL/min    Comment: (NOTE) Calculated using the CKD-EPI Creatinine Equation (2021)    Anion gap 12 5 - 15    Comment: Performed at [facility], [address] [place], [place] [address]  Brain natriuretic peptide     Status: Abnormal   Collection Time: 08/04/23  1:28 PM  Result Value Ref Range   B Natriuretic Peptide 134.1 (H) 0.0 - 100.0 pg/mL    Comment: Performed at [facility], [address] [place], [place] [address]  I-Stat venous blood gas, [facility], [facility], [facility])     Status: Abnormal   Collection Time: 08/04/23  1:44 PM  Result Value Ref Range   pH, Ven 7.542 (H) 7.25 - 7.43   pCO2, Ven 29.6 (L) 44 - 60 mmHg   pO2, Ven 136 (H) 32 - 45 mmHg   Bicarbonate 25.4 20.0 - 28.0 mmol/L   TCO2 26 22 - 32 mmol/L   O2 Saturation 99 %   Acid-Base Excess 4.0 (H) 0.0 - 2.0 mmol/L   Sodium 135 135 - 145 mmol/L   Potassium 3.8 3.5 - 5.1 mmol/L   Calcium, Ion 1.02 (L) 1.15 - 1.40 mmol/L   HCT 40.0 39.0 - 52.0 %   Hemoglobin 13.6 13.0 - 17.0 g/dL   Sample type VENOUS   CBG monitoring, ED     Status: Abnormal   Collection Time: 08/04/23 10:47 PM  Result Value Ref Range   Glucose-Capillary 280 (H) 70 - 99 mg/dL  Comment: Glucose reference range applies  only to samples taken after fasting for at least 8 hours.  CBC     Status: Abnormal   Collection Time: 08/05/23  2:31 AM  Result Value Ref Range   WBC 14.6 (H) 4.0 - 10.5 K/uL   RBC 4.40 4.22 - 5.81 MIL/uL   Hemoglobin 12.2 (L) 13.0 - 17.0 g/dL   HCT 16.1 09.6 - 04.5 %   MCV 89.1 80.0 - 100.0 fL   MCH 27.7 26.0 - 34.0 pg   MCHC 31.1 30.0 - 36.0 g/dL   RDW 40.9 (H) 81.1 - 91.4 %   Platelets 288 150 - 400 K/uL   nRBC 0.0 0.0 - 0.2 %    Comment: Performed at [facility], [address] [name], [place] [address]  Basic metabolic panel     Status: Abnormal   Collection Time: 08/05/23  2:31 AM  Result Value Ref Range   Sodium 135 135 - 145 mmol/L   Potassium 4.3 3.5 - 5.1 mmol/L   Chloride 99 98 - 111 mmol/L   CO2 23 22 - 32 mmol/L   Glucose, Bld 279 (H) 70 - 99 mg/dL    Comment: Glucose reference range applies only to samples taken after fasting for at least 8 hours.   BUN 27 (H) 8 - 23 mg/dL   Creatinine, Ser 6.21 0.61 - 1.24 mg/dL   Calcium 8.5 (L) 8.9 - 10.3 mg/dL   GFR, Estimated >30 >86 mL/min    Comment: (NOTE) Calculated using the CKD-EPI Creatinine Equation (2021)    Anion gap 13 5 - 15    Comment: Performed at [facility], [address] [place], [place] [address]  CBG monitoring, ED     Status: Abnormal   Collection Time: 08/05/23  7:18 AM  Result Value Ref Range   Glucose-Capillary 156 (H) 70 - 99 mg/dL    Comment: Glucose reference range applies only to samples taken after fasting for at least 8 hours.   Comment 1 Notify RN    Comment 2 Document in Chart   CBG monitoring, ED     Status: Abnormal   Collection Time: 08/05/23 12:24 PM  Result Value Ref Range   Glucose-Capillary 161 (H) 70 - 99 mg/dL    Comment: Glucose reference range applies only to samples taken after fasting for at least 8 hours.   *Note: Due to a large number of results and/or encounters for the requested time period, some results have not been displayed. A complete set of  results can be found in Results Review.   DG Chest Port 1 View  Result Date: 08/04/2023 CLINICAL DATA:  Shortness of breath.  Chest pain. EXAM: PORTABLE CHEST 1 VIEW COMPARISON:  Chest radiograph dated 07/25/2023. FINDINGS: Similar appearance of scarring at the left lung base and left perihilar region small left pleural effusion, similar or slightly decreased. No pneumothorax. Stable cardiac silhouette. Median sternotomy wires and CABG vascular clips. Left atrial appendage occlusion device. No acute osseous pathology. IMPRESSION: Small left pleural effusion, similar or slightly decreased. Electronically Signed   By: Elgie Collard M.D.   On: 08/04/2023 16:35    Pending Labs Unresulted Labs (From admission, onward)    None       Vitals/Pain Today's Vitals   08/05/23 1110 08/05/23 1200 08/05/23 1214 08/05/23 1350  BP:  (!) 109/59  103/81  Pulse: 98 (!) 46  67  Resp: 15 (!) 23  17  Temp:  98.1 F (36.7 C)   TempSrc:   Oral   SpO2: 96% 95%  95%  Weight:      Height:      PainSc:        Isolation Precautions No active isolations  Medications Medications  sodium chloride flush (NS) 0.9 % injection 3 mL (3 mLs Intravenous Given 08/05/23 0807)  acetaminophen (TYLENOL) tablet 650 mg (has no administration in time range)    Or  acetaminophen (TYLENOL) suppository 650 mg (has no administration in time range)  ondansetron (ZOFRAN) tablet 4 mg (has no administration in time range)    Or  ondansetron (ZOFRAN) injection 4 mg (has no administration in time range)  senna-docusate (Senokot-S) tablet 1 tablet (has no administration in time range)  amiodarone (PACERONE) tablet 100 mg (0 mg Oral Hold 08/04/23 2306)  atorvastatin (LIPITOR) tablet 80 mg (80 mg Oral Given 08/05/23 0807)  diltiazem (CARDIZEM CD) 24 hr capsule 120 mg (120 mg Oral Given 08/05/23 0806)  furosemide (LASIX) tablet 40 mg (40 mg Oral Given 08/05/23 0806)  potassium chloride (KLOR-CON M) CR tablet 10 mEq (10 mEq Oral  Given 08/05/23 0807)  rivaroxaban (XARELTO) tablet 20 mg (20 mg Oral Given 08/04/23 2331)  spironolactone (ALDACTONE) tablet 25 mg (25 mg Oral Given 08/05/23 0806)  tamsulosin (FLOMAX) capsule 0.4 mg (0.4 mg Oral Given 08/05/23 0806)  insulin aspart (novoLOG) injection 0-9 Units (2 Units Subcutaneous Given 08/05/23 0734)  arformoterol (BROVANA) nebulizer solution 15 mcg (15 mcg Nebulization Given 08/05/23 0735)  budesonide (PULMICORT) nebulizer solution 0.25 mg (0.25 mg Nebulization Given 08/05/23 0735)  levalbuterol (XOPENEX) nebulizer solution 0.63 mg (has no administration in time range)  predniSONE (DELTASONE) tablet 40 mg (40 mg Oral Given 08/05/23 0735)  metoprolol tartrate (LOPRESSOR) injection 5 mg (has no administration in time range)  diltiazem (CARDIZEM) injection 20 mg (0 mg Intravenous Hold 08/05/23 1038)  ipratropium-albuterol (DUONEB) 0.5-2.5 (3) MG/3ML nebulizer solution 3 mL (3 mLs Nebulization Given 08/04/23 1344)  methylPREDNISolone sodium succinate (SOLU-MEDROL) 125 mg/2 mL injection 125 mg (125 mg Intravenous Given 08/04/23 1344)  magnesium sulfate IVPB 2 g 50 mL (0 g Intravenous Stopped 08/04/23 1437)  albuterol (PROVENTIL) (2.5 MG/3ML) 0.083% nebulizer solution 10 mg (10 mg Nebulization Given 08/04/23 1549)  diltiazem (CARDIZEM) injection 10 mg (10 mg Intravenous Given 08/04/23 1835)    Mobility walks with device     Focused Assessments Cardiac Assessment Handoff:  Cardiac Rhythm: Atrial fibrillation Lab Results  Component Value Date   CKTOTAL 21 04/27/2018   CKMB 4.9 07/23/2017   TROPONINI <0.03 07/23/2017   No results found for: "DDIMER" Does the Patient currently have chest pain? No    R Recommendations: See Admitting Provider Note  Report given to:   Additional Notes:

## 2023-08-05 NOTE — Consult Note (Signed)
Palliative Care Consult Note                                  Date: 08/05/2023   Patient Name: Robert Bates  DOB: 02/14/1941  MRN: [id number]  Age / Sex: 82 y.o., male  PCP: Sandford Craze, NP Referring Physician: Dorcas Carrow, MD  Reason for Consultation: Establishing goals of care  HPI/Patient Profile: 82 y.o. 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 He was admitted on 08/04/2023 with COPD with acute exacerbation, paroxysmal A-fib with RVR, chronic heart failure, and others.   PMT was consulted for GOC conversations.  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   COPD (chronic obstructive pulmonary disease) (HCC)    Coronary artery disease    Diabetes type 2, controlled (HCC) 07/31/2017   Diverticulitis 07/29/2023   Dyspnea    Dysrhythmia    a fib   GERD (gastroesophageal reflux disease)    Hematuria    refuses work up or referral - understands risks of morbidity / mortality - 11/2008, 12/2008   Heme positive stool    History of hiatal hernia    History of kidney stones    Hyperlipemia    Meningioma (HCC) 10/25/2013   Follows with Dr. Coletta Memos.    Peripheral vascular disease (HCC)    Abdominal Aortic Aneursym   Pneumonia    as a child   Radiation 09/18/15-10/25/15   left lower lobe 70.2 Gy   Seizures  (HCC) 02/18/2020   Tobacco abuse     Subjective:   This NP Wynne Dust reviewed medical records, received report from team, assessed the patient and then meet at the patient's bedside to discuss diagnosis, prognosis, GOC, EOL wishes disposition and options.  I met with the patient at the bedside in the emergency department, no family present.   We meet to discuss diagnosis prognosis, GOC, EOL wishes, disposition and options. Concept of Palliative Care was introduced as specialized medical care for people and their families living with serious illness.  If focuses on providing relief from the symptoms and stress of a serious illness.  The goal is to improve quality of life for both the patient and the family. Values and goals of care important to patient and family were attempted to be elicited.  Created space and opportunity for patient  and family to explore thoughts and feelings regarding current medical situation   Natural trajectory and current clinical status were discussed. Questions and concerns addressed. Patient  encouraged to call with questions or concerns.    Patient/Family Understanding of Illness: Patient understands he has A-fib with RVR.  He states he previous had cancer but had radiation treatment which was deemed curative but caused lung damage.  He is not sure about COPD but possibly related to radiation treatment.  He admits he is hard of hearing.  He states overall he is seen his health going downhill over the past months to year.  We discussed how this is hard to see sometimes and he agreed.  Life Review: The patient's wife passed away in January 30, 2023 of this year, seems to be still grieving.  He has 1 son who lives with him but he states that they do not get along.[name]  However, he does not seem to fear for his safety.  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  He has a niece named Steward Drone who is a Engineer, civil (consulting) who he states nags him to take care of  himself.  Goals: Maximize quality of life  Today's Discussion: In addition to discussions described above we had extensive discussion of various topics.  He has notable wheezing on exam today as well as lower extremity edema.  We discussed propping feet up to help with the edema and ways to do this.  We also discussed the possibility of TED hose while he is here to help apparently from cardiology note he has soft blood pressures and likely not tolerating more IV diuresis at this point.  He states his breathing is "so-so" with occasional dyspnea that is generally worse in the morning.  At home his inhaler has helped.  Breathing treatments here he feel has helped as well.  He shares that in July 2022 he was told he did not have long to live and has done so for 2 years.  We celebrated this extra time that he has had.  We did have a discussion about advance care planning.  We discussed CODE STATUS and he agrees that he would like to be DNR and DNI.  However, just short of cardiac arrest he would like medical interventions as appropriate to try to prevent catastrophic situation.  He also shares that he would not want a feeding tube to be placed in an attempt to artificially prolong his life.  We discussed decision making.  He shares that he would like his niece Zipporah Plants to make his decisions should he be unable to make them himself.  He is agreeable to an attempt to complete HCPOA paperwork while in the hospital.   We also discussed considering outpatient palliative care at discharge.  He is agreeable to this.  Prior to discussing outpatient hospice we had to end our time because nursing needed to transport him upstairs for admission to a new room.  I shared that I would attempt to come back and see him tomorrow to continue our discussions.  I called the patient's niece Steward Drone and updated her on situation.  She agrees that his living situation is not good and that his health is declining.  We  discussed possibly approaching the idea of hospice and further discussions and she seems agreeable to this.  I provided emotional and general support through therapeutic listening, empathy, sharing of stories, and other techniques. I answered all questions and addressed all concerns to the best of my ability.  Review of Systems  Constitutional:  Positive for fatigue.  Respiratory:  Positive for shortness of breath and wheezing.   Cardiovascular:  Negative for chest pain.  Gastrointestinal:  Negative for abdominal pain, nausea and vomiting.    Objective:   Primary Diagnoses: Present on Admission:  Coronary artery disease  Primary hypertension   Physical Exam Vitals and nursing note reviewed.  HENT:     Head: Normocephalic and atraumatic.  Cardiovascular:     Rate and Rhythm: Normal  rate and regular rhythm.  Pulmonary:     Effort: Pulmonary effort is normal. No tachypnea or respiratory distress.     Breath sounds: Wheezing present.  Abdominal:     General: Abdomen is protuberant.  Skin:    General: Skin is warm and dry.  Neurological:     General: No focal deficit present.     Mental Status: He is alert and oriented to person, place, and time.  Psychiatric:        Mood and Affect: Mood normal.        Behavior: Behavior normal.     Vital Signs:  BP (!) 109/59   Pulse (!) 46   Temp 98.1 F (36.7 C) (Oral)   Resp (!) 23   Ht 5\' 8"  (1.727 m)   Wt 79.8 kg   SpO2 95%   BMI 26.76 kg/m   Palliative Assessment/Data: 60-70%    Advanced Care Planning:   Existing Vynca/ACP Documentation: None  Primary Decision Maker: PATIENT  Code Status/Advance Care Planning: DNR-Limited  A discussion was had today regarding advanced directives. Concepts specific to code status, artifical feeding and hydration, continued IV antibiotics and rehospitalization was had.  The difference between a aggressive medical intervention path and a palliative comfort care path for this patient  at this time was had.   Decisions/Changes to ACP: Goals of Care Document completed 08/05/2023  Assessment & Plan:   Impression: 82 year old male with chronic comorbidities and acute presentation as described above.  This point is being admitted for A-fib RVR and COPD exacerbation.  He notes his health is declined noticeably over the past year.  He has elected DNR-limited status and has vocalized his opposition to feeding tubes.  He does note he wants his HCPOA to be his niece Steward Drone and is agreeable to advance directives being completed to this effect.  Our visit was ended somewhat short today due to need to transport upstairs but palliative will follow-up again tomorrow if he is still admitted.  SUMMARY OF RECOMMENDATIONS   DNR-intervention status Consult spiritual care for advanced directive if existing document unable to be found GOC document completed in Vynca Continue to treat the treatable Referral for outpatient palliative care Further discussions on goals in the coming day or 2 PMT will continue to follow  Symptom Management:  Per primary team PMT is available to assist as needed  Prognosis:  Unable to determine  Discharge Planning:  To Be Determined   Discussed with: Patient, medical team, nursing team, patient's family    Thank you for allowing [place] to participate in the care of Saurabh Carmona PMT will continue to support holistically.  Time Total: 115 min  Detailed review of medical records (labs, imaging, vital signs), medically appropriate exam, discussed with treatment team, counseling and education to patient, family, & staff, documenting clinical information, medication management, coordination of care  Signed by: Wynne Dust, NP Palliative Medicine Team  Team Phone # [phone] (Nights/Weekends)  08/05/2023, 1:23 PM

## 2023-08-05 NOTE — Progress Notes (Signed)
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  She was treated for COPD exacerbation as outpatient with prednisone taper but did not relief.  Recently seen at PCP office and started on Cardizem for rapid A-fib.  Was seen today at cardiology clinic, found to have rapid A-fib and shortness of breath with active wheezing, heart rate 178 was sent to ER. In the emergency room blood pressure is stable.  Heart rate 107 and fluctuates to 160.  91% on room air.  COVID, influenza and RSV negative.  Chest x-ray similar to previous.  Given steroids, magnesium, Cardizem and DuoNebs and admitted to the hospital.  Cardiology following.   Assessment & Plan:   COPD with acute exacerbation Agree with admission to monitored unit because of severity of symptoms. Aggressive bronchodilator therapy, IV steroids-oral steroids, inhalational steroids, scheduled and as needed bronchodilators, deep breathing exercises, incentive spirometry, chest physiotherapy. Supplemental oxygen to keep saturations more than 90%.  Paroxysmal A-fib with RVR: Heart rate fluctuates.  Currently on Cardizem 120 mg daily, on Xarelto.  On amiodarone Monday Wednesday Friday. Patient had elevated heart rate in the morning, however that improved and now sinus rhythm on my exam.  Chronic heart failure: Known ejection fraction 55 to 60%.  Present with increased lower extremity swelling and stable left pleural effusion.  Not hypoxemic. Continue oral Lasix, spironolactone.   History of bradycardia with beta-blockers, not on any beta-blockers.  CAD status post CABG: Stable. Type 2 diabetes: On oral hypoglycemics at home.  Currently on SSI. BPH, Flomax  Remains symptomatic with shortness of breath and fluctuating heart rate.  Needs to stay in the hospital. DNR with limited intervention, document. Patient may benefit with community-based palliative or hospice care.  Will involve palliative care team for conversation.    DVT prophylaxis: rivaroxaban (XARELTO) tablet 20 mg Start: 08/04/23 2300 rivaroxaban (XARELTO) tablet 20 mg   Code Status: DNR/DNI limited intervention Family Communication: None at the bedside Disposition Plan: Status is: Observation The patient will require care spanning > 2 midnights and should be moved to inpatient because: COPD exacerbation, unstable heart rate     Consultants:  Cardiology  Procedures:  None  Antimicrobials:  None   Subjective: Patient seen in the morning rounds.  He was stressed out being on a stretcher all day all night yesterday.  Heart rate was as high as 150 and A-fib.  Patient denied any chest pain or palpitations.  He just did not feel comfortable in the stretcher. He was given a hospital bed, morning medicine.  He converted to sinus rhythm.  Still has a lot of wheezing.  His ankles are swollen but better than yesterday as per patient.  Objective: Vitals:   08/05/23 1040 08/05/23 1045 08/05/23 1050 08/05/23 1110  BP:      Pulse: 84 (!) 29 (!) 28 98  Resp: 17 15 17 15   Temp:      TempSrc:      SpO2: 96% 95% 96% 96%  Weight:      Height:       No intake or output data in the 24  hours ending 08/05/23 1111 Filed Weights   08/05/23 0230  Weight: 79.8 kg    Examination:  General exam: Chronically sick looking.  In mild respiratory distress.  Anxious and uncomfortable in stretcher. Alert awake and oriented.  Sense of humor. Respiratory system: Conducted upper airway sounds.  Bilateral expiratory  wheezes present.  In mild distress while talking.  On room air. Cardiovascular system: S1 & S2 heard, irregularly irregular.  Tachycardic.  Patient has edema both ankles 1+.  Gastrointestinal system: Soft.  Nontender.  Protuberant.  Bowel sound present. Central nervous system: Alert and oriented. No focal neurological deficits.  Generalized weakness. Extremities: Symmetric 5 x 5 power.    Data Reviewed: I have personally reviewed following labs and imaging studies  CBC: Recent Labs  Lab 08/04/23 1328 08/04/23 1344 08/05/23 0231  WBC 13.4*  --  14.6*  HGB 12.6* 13.6 12.2*  HCT 41.1 40.0 39.2  MCV 88.4  --  89.1  PLT 291  --  288   Basic Metabolic Panel: Recent Labs  Lab 08/04/23 1328 08/04/23 1344 08/05/23 0231  NA 137 135 135  K 3.9 3.8 4.3  CL 102  --  99  CO2 23  --  23  GLUCOSE 127*  --  279*  BUN 27*  --  27*  CREATININE 0.95  --  1.09  CALCIUM 8.9  --  8.5*   GFR: Estimated Creatinine Clearance: 50.6 mL/min (by C-G formula based on SCr of 1.09 mg/dL). Liver Function Tests: No results for input(s): "AST", "ALT", "ALKPHOS", "BILITOT", "PROT", "ALBUMIN" in the last 168 hours. No results for input(s): "LIPASE", "AMYLASE" in the last 168 hours. No results for input(s): "AMMONIA" in the last 168 hours. Coagulation Profile: No results for input(s): "INR", "PROTIME" in the last 168 hours. Cardiac Enzymes: No results for input(s): "CKTOTAL", "CKMB", "CKMBINDEX", "TROPONINI" in the last 168 hours. BNP (last 3 results) No results for input(s): "PROBNP" in the last 8760 hours. HbA1C: No results for input(s): "HGBA1C" in the last 72 hours. CBG: Recent Labs  Lab 08/04/23 2247 08/05/23 0718  GLUCAP 280* 156*   Lipid Profile: No results for input(s): "CHOL", "HDL", "LDLCALC", "TRIG", "CHOLHDL", "LDLDIRECT" in the last 72 hours. Thyroid Function Tests: No results for input(s): "TSH", "T4TOTAL", "FREET4", "T3FREE", "THYROIDAB" in the last 72 hours. Anemia Panel: No  results for input(s): "VITAMINB12", "FOLATE", "FERRITIN", "TIBC", "IRON", "RETICCTPCT" in the last 72 hours. Sepsis Labs: No results for input(s): "PROCALCITON", "LATICACIDVEN" in the last 168 hours.  Recent Results (from the past 240 hour(s))  Resp panel by RT-PCR (RSV, Flu A&B, Covid) Anterior Nasal Swab     Status: None   Collection Time: 08/04/23  1:14 PM   Specimen: Anterior Nasal Swab  Result Value Ref Range Status   SARS Coronavirus 2 by RT PCR NEGATIVE NEGATIVE Final   Influenza A by PCR NEGATIVE NEGATIVE Final   Influenza B by PCR NEGATIVE NEGATIVE Final    Comment: (NOTE) The Xpert Xpress SARS-CoV-2/FLU/RSV plus assay is intended as an aid in the diagnosis of influenza from Nasopharyngeal swab specimens and should not be used as a sole basis for treatment. Nasal washings and aspirates are unacceptable for Xpert Xpress SARS-CoV-2/FLU/RSV testing.  Fact Sheet for Patients: BloggerCourse.com  Fact Sheet for Healthcare Providers: SeriousBroker.it  This test is not yet approved or cleared by the [place] FDA and has been authorized for detection and/or diagnosis of SARS-CoV-2 by FDA under an Emergency Use Authorization (EUA). This EUA will remain in effect (meaning  this test can be used) for the duration of the COVID-19 declaration under Section 564(b)(1) of the Act, 21 U.S.C. section 360bbb-3(b)(1), unless the authorization is terminated or revoked.     Resp Syncytial Virus by PCR NEGATIVE NEGATIVE Final    Comment: (NOTE) Fact Sheet for Patients: BloggerCourse.com  Fact Sheet for Healthcare Providers: SeriousBroker.it  This test is not yet approved or cleared by the [place] FDA and has been authorized for detection and/or diagnosis of SARS-CoV-2 by FDA under an Emergency Use Authorization (EUA). This EUA will remain in effect (meaning this test can be used)  for the duration of the COVID-19 declaration under Section 564(b)(1) of the Act, 21 U.S.C. section 360bbb-3(b)(1), unless the authorization is terminated or revoked.  Performed at [facility], [address], [place], [place] [address]          Radiology Studies: DG Chest Port 1 View  Result Date: 08/04/2023 CLINICAL DATA:  Shortness of breath.  Chest pain. EXAM: PORTABLE CHEST 1 VIEW COMPARISON:  Chest radiograph dated 07/25/2023. FINDINGS: Similar appearance of scarring at the left lung base and left perihilar region small left pleural effusion, similar or slightly decreased. No pneumothorax. Stable cardiac silhouette. Median sternotomy wires and CABG vascular clips. Left atrial appendage occlusion device. No acute osseous pathology. IMPRESSION: Small left pleural effusion, similar or slightly decreased. Electronically Signed   By: Elgie Collard M.D.   On: 08/04/2023 16:35        Scheduled Meds:  amiodarone  100 mg Oral Q M,W,F   arformoterol  15 mcg Nebulization BID   atorvastatin  80 mg Oral Daily   budesonide (PULMICORT) nebulizer solution  0.25 mg Nebulization BID   diltiazem  120 mg Oral Daily   diltiazem  20 mg Intravenous Once   furosemide  40 mg Oral Daily   insulin aspart  0-9 Units Subcutaneous TID WC   potassium chloride  10 mEq Oral Daily   predniSONE  40 mg Oral Q breakfast   rivaroxaban  20 mg Oral QPM   sodium chloride flush  3 mL Intravenous Q12H   spironolactone  25 mg Oral Daily   tamsulosin  0.4 mg Oral Daily   Continuous Infusions:   LOS: 0 days    Time spent: 40 minutes    Dorcas Carrow, MD [facility]

## 2023-08-06 ENCOUNTER — Telehealth: Payer: Medicare Other

## 2023-08-06 ENCOUNTER — Telehealth: Payer: Self-pay | Admitting: Family

## 2023-08-06 DIAGNOSIS — E7800 Pure hypercholesterolemia, unspecified: Secondary | ICD-10-CM | POA: Diagnosis not present

## 2023-08-06 DIAGNOSIS — Z66 Do not resuscitate: Secondary | ICD-10-CM

## 2023-08-06 DIAGNOSIS — Z7189 Other specified counseling: Secondary | ICD-10-CM

## 2023-08-06 DIAGNOSIS — J441 Chronic obstructive pulmonary disease with (acute) exacerbation: Secondary | ICD-10-CM | POA: Diagnosis not present

## 2023-08-06 DIAGNOSIS — Z515 Encounter for palliative care: Secondary | ICD-10-CM

## 2023-08-06 DIAGNOSIS — I5032 Chronic diastolic (congestive) heart failure: Secondary | ICD-10-CM

## 2023-08-06 DIAGNOSIS — I2510 Atherosclerotic heart disease of native coronary artery without angina pectoris: Secondary | ICD-10-CM | POA: Diagnosis not present

## 2023-08-06 DIAGNOSIS — I4891 Unspecified atrial fibrillation: Secondary | ICD-10-CM | POA: Diagnosis not present

## 2023-08-06 LAB — GLUCOSE, CAPILLARY
Glucose-Capillary: 167 mg/dL — ABNORMAL HIGH (ref 70–99)
Glucose-Capillary: 107 mg/dL — ABNORMAL HIGH (ref 70–99)
Glucose-Capillary: 249 mg/dL — ABNORMAL HIGH (ref 70–99)
Glucose-Capillary: 216 mg/dL — ABNORMAL HIGH (ref 70–99)

## 2023-08-06 MED ORDER — AMIODARONE HCL 200 MG PO TABS: 200.0000 mg | ORAL_TABLET | ORAL | Status: DC

## 2023-08-06 MED ORDER — METHYLPREDNISOLONE SODIUM SUCC 125 MG IJ SOLR
125.0000 mg | INTRAMUSCULAR | Status: DC
  Administered 2023-08-06 – 2023-08-07 (×2): 125 mg via INTRAVENOUS
  Filled 2023-08-06 (×2): qty 2

## 2023-08-06 MED ORDER — AMIODARONE HCL 200 MG PO TABS
200.0000 mg | ORAL_TABLET | Freq: Every day | ORAL | Status: DC
  Administered 2023-08-06: 200 mg via ORAL
  Filled 2023-08-06: qty 1

## 2023-08-06 MED ORDER — FUROSEMIDE 10 MG/ML IJ SOLN
40.0000 mg | Freq: Once | INTRAMUSCULAR | Status: AC
  Administered 2023-08-06: 40 mg via INTRAVENOUS
  Filled 2023-08-06: qty 4

## 2023-08-06 NOTE — Progress Notes (Signed)
Daily Progress Note   Patient Name: Robert Bates       Date: 08/06/2023 DOB: 07/31/1941  Age: 82 y.o. MRN#: [id number] Attending Physician: Joseph Art, DO Primary Care Physician: Sandford Craze, NP Admit Date: 08/04/2023 Length of Stay: 1 day  Reason for Consultation/Follow-up: {Reason for Consult:23484}  HPI/Patient Profile:  ***  Subjective:   Subjective: Chart Reviewed. Updates received. Patient Assessed. Created space and opportunity for patient  and family to explore thoughts and feelings regarding current medical situation.  Today's Discussion: ***  Review of Systems  Objective:   Vital Signs:  BP 109/62 (BP Location: Left Arm)   Pulse 73   Temp (!) 97.3 F (36.3 C) (Axillary)   Resp 16   Ht 5\' 8"  (1.727 m)   Wt 77.6 kg   SpO2 98%   BMI 26.02 kg/m   Physical Exam: Physical Exam  Palliative Assessment/Data: ***    Existing Vynca/ACP Documentation: ***  Assessment & Plan:   Impression: Present on Admission:  Coronary artery disease  Primary hypertension  Atrial fibrillation with RVR (HCC)  ***  SUMMARY OF RECOMMENDATIONS   ***  Symptom Management:  ***  Code Status: {Palliative Code status:23503}  Prognosis: {Palliative Care Prognosis:23504}  Discharge Planning: {Palliative dispostion:23505}  Discussed with: ***  Thank you for allowing [place] to participate in the care of Robert Bates PMT will continue to support holistically.  Time Total: ***  Detailed review of medical records (labs, imaging, vital signs), medically appropriate exam, discussed with treatment team, counseling and education to patient, family, & staff, documenting clinical information, medication management, coordination of care  Wynne Dust, NP Palliative Medicine Team  Team Phone # [phone] (Nights/Weekends)  07/03/2021, 8:17 AM

## 2023-08-06 NOTE — Progress Notes (Signed)
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  She was treated for COPD exacerbation as outpatient with prednisone taper but did not relief.  Recently seen at PCP office and started on Cardizem for rapid A-fib.  Was seen today at cardiology clinic, found to have rapid A-fib and shortness of breath with active wheezing, heart rate 178 was sent to ER. In the emergency room blood pressure is stable.  Heart rate 107 and fluctuates to 160.  91% on room air.  COVID, influenza and RSV negative.  Chest x-ray similar to previous.  Given steroids, magnesium, Cardizem and DuoNebs and admitted to the hospital.  Cardiology following.   Assessment & Plan:   COPD with acute exacerbation Aggressive bronchodilator therapy, IV steroids- resumed as still incredibly tight and wheezing, inhalational steroids, scheduled and as needed bronchodilators, deep breathing exercises, incentive spirometry, chest physiotherapy. Supplemental oxygen to keep saturations more than 90%. -home O2 eval  Paroxysmal A-fib with RVR: Heart rate fluctuates.  Currently on Cardizem 120 mg daily, on Xarelto.  On amiodarone Monday Wednesday Friday. -still with continued fluctuations in HR -cardiology following  Chronic heart failure: Known ejection fraction 55 to 60%.  Present with increased lower extremity swelling and stable left pleural effusion.  Not hypoxemic. Continue oral Lasix, spironolactone.  History of bradycardia with beta-blockers, not on any  beta-blockers.  CAD status post CABG: Stable. Type 2 diabetes: On oral hypoglycemics at home.  Currently on SSI. 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  Follows with oncology Dr. Arbutus Ped  DNR with limited intervention Patient may benefit with community-based palliative or hospice care. Palliative care team following.    DVT prophylaxis: Place TED hose Start: 08/06/23 0810 Place TED hose Start: 08/05/23 1323 rivaroxaban (XARELTO) tablet 20 mg   Code Status: DNR/DNI limited intervention Family Communication: None at the bedside Disposition Plan: Status is: inpt     Consultants:  Cardiology    Subjective: Still very wheezy  Objective: Vitals:   08/06/23 0025 08/06/23 0335 08/06/23 0742 08/06/23 0751  BP: 98/71 101/74 117/82   Pulse:  78 (!) 107 (!) 106  Resp:  15 16 (!) 23  Temp:  97.8 F (36.6 C) 97.6 F (36.4 C)   TempSrc:  Oral Axillary   SpO2:  96% 97%   Weight:  77.6 kg    Height:        Intake/Output Summary (Last 24 hours) at 08/06/2023 1020 Last data filed at 08/06/2023 0747 Gross per 24 hour  Intake --  Output 250 ml  Net -250 ml   Filed Weights   08/05/23 0230 08/06/23 0335  Weight: 79.8 kg 77.6 kg    Examination:   General: Appearance:     Overweight male sitting on side of bed     Lungs:     Wheezy and tight, mild increased work of breathing  Heart:    Tachycardic. HR up to 140s  MS:   All extremities are intact.   Neurologic:   Awake, alert, oriented x 3. No apparent focal neurological  defect.        Data Reviewed: I have personally reviewed following labs and imaging studies  CBC: Recent Labs  Lab 08/04/23 1328 08/04/23 1344 08/05/23 0231  WBC 13.4*  --  14.6*  HGB 12.6* 13.6 12.2*  HCT 41.1 40.0 39.2  MCV 88.4  --  89.1  PLT 291  --  288   Basic Metabolic Panel: Recent Labs  Lab 08/04/23 1328 08/04/23 1344 08/05/23 0231  NA 137 135 135  K 3.9 3.8 4.3  CL 102   --  99  CO2 23  --  23  GLUCOSE 127*  --  279*  BUN 27*  --  27*  CREATININE 0.95  --  1.09  CALCIUM 8.9  --  8.5*   GFR: Estimated Creatinine Clearance: 50.6 mL/min (by C-G formula based on SCr of 1.09 mg/dL). Liver Function Tests: No results for input(s): "AST", "ALT", "ALKPHOS", "BILITOT", "PROT", "ALBUMIN" in the last 168 hours. No results for input(s): "LIPASE", "AMYLASE" in the last 168 hours. No results for input(s): "AMMONIA" in the last 168 hours. Coagulation Profile: No results for input(s): "INR", "PROTIME" in the last 168 hours. Cardiac Enzymes: No results for input(s): "CKTOTAL", "CKMB", "CKMBINDEX", "TROPONINI" in the last 168 hours. BNP (last 3 results) No results for input(s): "PROBNP" in the last 8760 hours. HbA1C: No results for input(s): "HGBA1C" in the last 72 hours. CBG: Recent Labs  Lab 08/05/23 0718 08/05/23 1224 08/05/23 1602 08/05/23 2129 08/06/23 0853  GLUCAP 156* 161* 155* 155* 167*   Lipid Profile: No results for input(s): "CHOL", "HDL", "LDLCALC", "TRIG", "CHOLHDL", "LDLDIRECT" in the last 72 hours. Thyroid Function Tests: No results for input(s): "TSH", "T4TOTAL", "FREET4", "T3FREE", "THYROIDAB" in the last 72 hours. Anemia Panel: No results for input(s): "VITAMINB12", "FOLATE", "FERRITIN", "TIBC", "IRON", "RETICCTPCT" in the last 72 hours. Sepsis Labs: No results for input(s): "PROCALCITON", "LATICACIDVEN" in the last 168 hours.  Recent Results (from the past 240 hour(s))  Resp panel by RT-PCR (RSV, Flu A&B, Covid) Anterior Nasal Swab     Status: None   Collection Time: 08/04/23  1:14 PM   Specimen: Anterior Nasal Swab  Result Value Ref Range Status   SARS Coronavirus 2 by RT PCR NEGATIVE NEGATIVE Final   Influenza A by PCR NEGATIVE NEGATIVE Final   Influenza B by PCR NEGATIVE NEGATIVE Final    Comment: (NOTE) The Xpert Xpress SARS-CoV-2/FLU/RSV plus assay is intended as an aid in the diagnosis of influenza from Nasopharyngeal swab  specimens and should not be used as a sole basis for treatment. Nasal washings and aspirates are unacceptable for Xpert Xpress SARS-CoV-2/FLU/RSV testing.  Fact Sheet for Patients: BloggerCourse.com  Fact Sheet for Healthcare Providers: SeriousBroker.it  This test is not yet approved or cleared by the [place] FDA and has been authorized for detection and/or diagnosis of SARS-CoV-2 by FDA under an Emergency Use Authorization (EUA). This EUA will remain in effect (meaning this test can be used) for the duration of the COVID-19 declaration under Section 564(b)(1) of the Act, 21 U.S.C. section 360bbb-3(b)(1), unless the authorization is terminated or revoked.     Resp Syncytial Virus by PCR NEGATIVE NEGATIVE Final    Comment: (NOTE) Fact Sheet for Patients: BloggerCourse.com  Fact Sheet for Healthcare Providers: SeriousBroker.it  This test is not yet approved or cleared by the [place] FDA and has been authorized for detection and/or diagnosis of SARS-CoV-2 by FDA under an Emergency Use Authorization (EUA). This EUA will remain in effect (meaning this  test can be used) for the duration of the COVID-19 declaration under Section 564(b)(1) of the Act, 21 U.S.C. section 360bbb-3(b)(1), unless the authorization is terminated or revoked.  Performed at [facility], [address], [place], [place] [address]          Radiology Studies: DG Chest Port 1 View  Result Date: 08/04/2023 CLINICAL DATA:  Shortness of breath.  Chest pain. EXAM: PORTABLE CHEST 1 VIEW COMPARISON:  Chest radiograph dated 07/25/2023. FINDINGS: Similar appearance of scarring at the left lung base and left perihilar region small left pleural effusion, similar or slightly decreased. No pneumothorax. Stable cardiac silhouette. Median sternotomy wires and CABG vascular clips. Left atrial appendage  occlusion device. No acute osseous pathology. IMPRESSION: Small left pleural effusion, similar or slightly decreased. Electronically Signed   By: Elgie Collard M.D.   On: 08/04/2023 16:35        Scheduled Meds:  amiodarone  200 mg Oral Daily   arformoterol  15 mcg Nebulization BID   atorvastatin  80 mg Oral Daily   budesonide (PULMICORT) nebulizer solution  0.25 mg Nebulization BID   diltiazem  120 mg Oral Daily   insulin aspart  0-9 Units Subcutaneous TID WC   methylPREDNISolone (SOLU-MEDROL) injection  60 mg Intravenous Q12H   potassium chloride  10 mEq Oral Daily   rivaroxaban  20 mg Oral QPM   sodium chloride flush  3 mL Intravenous Q12H   spironolactone  25 mg Oral Daily   tamsulosin  0.4 mg Oral Daily   Continuous Infusions:   LOS: 1 day    Time spent: 40 minutes    Joseph Art, DO [facility]

## 2023-08-06 NOTE — Telephone Encounter (Signed)
FYI-Pt called to advise he is in the hospital but that he will come get it when he is discharged.

## 2023-08-06 NOTE — Progress Notes (Addendum)
Progress Note  Patient Name: Robert Bates Date of Encounter: 08/06/2023  [facility] Cardiologist: Kristeen Miss, MD   Subjective   Patient has a history of COPD, PAF on Xarelto and CAD status post remote CABG as well as chronic HFpEF.  Also has a history of chronic left pleural effusion and squamous cell lung cancer status post curative radiotherapy, diabetes 2, hypertension, hyperlipidemia and meningioma.  He had a flareup of COPD on 07/25/2023 treated with steroids and albuterol.  Found to be back in A-fib with RVR on 9/24 by PCP and started on Cardizem CD 120 mg daily along with his Amio and Xarelto that he was already on.  Seen back on 9/27 and noted to have volume overload and Lasix was increased to 40 mg every morning and 20 mg every 8 PM x 3 days and then back to 40 mg daily.  Seen by cardiology the day PTA and was in A-fib with RVR along with shortness of breath and active wheezing with a heart rate of 178 bpm.    He was sent to the ER her heart rate was 107 bpm with blood pressure 109/78 mmHg.  BNP minimally elevated at 134.  Chest x-ray showed small left pleural effusion similar or slightly decreased from prior.  Diagnosed with acute COPD exacerbation and in the ER given IV Solu-Medrol IV mag and started on IV dill drip.  Now off Cardizem drip and on Cardizem CD 120 mg daily along with amiodarone 100 mg Monday Wednesday and Friday which is his normal home dose.  He tells me he has not missed any doses of Xarelto in the past 4 weeks. Denies any chest pain or pressure but has chronic shortness of breath from COPD.  He also has had some lower extremity edema.  Currently on Lasix 40 mg daily and spironolactone 25 mg daily.  No I's and O's recorded in the ER.  Converted to NSR yesterday but then last night went back into atrial fibrillation with RVR.  He was given Cardizem 5 mg IV followed by another 5 mg.  Now in atrial flutter with variable block.  Inpatient Medications     Scheduled Meds:  amiodarone  100 mg Oral Q M,W,F   arformoterol  15 mcg Nebulization BID   atorvastatin  80 mg Oral Daily   budesonide (PULMICORT) nebulizer solution  0.25 mg Nebulization BID   diltiazem  120 mg Oral Daily   furosemide  40 mg Oral Daily   insulin aspart  0-9 Units Subcutaneous TID WC   potassium chloride  10 mEq Oral Daily   predniSONE  40 mg Oral Q breakfast   rivaroxaban  20 mg Oral QPM   sodium chloride flush  3 mL Intravenous Q12H   spironolactone  25 mg Oral Daily   tamsulosin  0.4 mg Oral Daily   Continuous Infusions:  PRN Meds: acetaminophen **OR** acetaminophen, levalbuterol, metoprolol tartrate, ondansetron **OR** ondansetron (ZOFRAN) IV, senna-docusate   Vital Signs    Vitals:   08/06/23 0013 08/06/23 0025 08/06/23 0335 08/06/23 0742  BP: (!) 89/71 98/71 101/74 117/82  Pulse:   78 (!) 107  Resp: 16  15 16   Temp: 97.6 F (36.4 C)  97.8 F (36.6 C) 97.6 F (36.4 C)  TempSrc: Oral  Oral Axillary  SpO2: 97%  96% 97%  Weight:   77.6 kg   Height:        Intake/Output Summary (Last 24 hours) at 08/06/2023 0747 Last data filed at  08/06/2023 0747 Gross per 24 hour  Intake --  Output 250 ml  Net -250 ml      08/06/2023    3:35 AM 08/05/2023    2:30 AM 08/04/2023   11:50 AM  Last 3 Weights  Weight (lbs) 171 lb 1.6 oz 176 lb 174 lb  Weight (kg) 77.61 kg 79.833 kg 78.926 kg      Telemetry    Atrial flutter with variable block personally Reviewed  ECG    No new EKG- Personally Reviewed  Physical Exam   GEN: Well nourished, well developed in no acute distress HEENT: Normal NECK: No JVD; No carotid bruits LYMPHATICS: No lymphadenopathy CARDIAC:irregularly irregular, no murmurs, rubs, gallops RESPIRATORY:  diffuse wheezes ABDOMEN: Soft, non-tender, non-distended MUSCULOSKELETAL: Plus pitting bilateral lower extremity edema; No deformity  SKIN: Warm and dry NEUROLOGIC:  Alert and oriented x 3 PSYCHIATRIC:  Normal affect  Labs    High  Sensitivity Troponin:  No results for input(s): "TROPONINIHS" in the last 720 hours.    Chemistry Recent Labs  Lab 08/04/23 1328 08/04/23 1344 08/05/23 0231  NA 137 135 135  K 3.9 3.8 4.3  CL 102  --  99  CO2 23  --  23  GLUCOSE 127*  --  279*  BUN 27*  --  27*  CREATININE 0.95  --  1.09  CALCIUM 8.9  --  8.5*  GFRNONAA >60  --  >60  ANIONGAP 12  --  13     Hematology Recent Labs  Lab 08/04/23 1328 08/04/23 1344 08/05/23 0231  WBC 13.4*  --  14.6*  RBC 4.65  --  4.40  HGB 12.6* 13.6 12.2*  HCT 41.1 40.0 39.2  MCV 88.4  --  89.1  MCH 27.1  --  27.7  MCHC 30.7  --  31.1  RDW 17.1*  --  17.0*  PLT 291  --  288    BNP Recent Labs  Lab 08/04/23 1328  BNP 134.1*     DDimer No results for input(s): "DDIMER" in the last 168 hours.   CHA2DS2-VASc Score = 6   This indicates a 9.7% annual risk of stroke. The patient's score is based upon: CHF History: 1 HTN History: 1 Diabetes History: 1 Stroke History: 0 Vascular Disease History: 1 Age Score: 2 Gender Score: 0   Radiology    DG Chest Port 1 View  Result Date: 08/04/2023 CLINICAL DATA:  Shortness of breath.  Chest pain. EXAM: PORTABLE CHEST 1 VIEW COMPARISON:  Chest radiograph dated 07/25/2023. FINDINGS: Similar appearance of scarring at the left lung base and left perihilar region small left pleural effusion, similar or slightly decreased. No pneumothorax. Stable cardiac silhouette. Median sternotomy wires and CABG vascular clips. Left atrial appendage occlusion device. No acute osseous pathology. IMPRESSION: Small left pleural effusion, similar or slightly decreased. Electronically Signed   By: Elgie Collard M.D.   On: 08/04/2023 16:35    Patient Profile     82 y.o. male Robert Bates is a 82 y.o. male with medical history significant for CAD s/p CABG, chronic HFpEF (EF 55-60%), PAF on Xarelto, COPD, chronic left pleural effusion, squamous cell LUL lung cancer s/p curative radiotherapy, meningioma, T2DM,  HTN, HLD, BPH who presented to the ED from cardiology clinic for evaluation of shortness of breath fibrillation with RVR.  Assessment & Plan    COPD with acute exacerbation: Failed outpatient management.  Symptoms improving with IV steroids and nebulizers administered in the ED  -Shortness of breath  improved but still has wheezing on exam -Currently on Brovana/Pulmicort BID, Xopenex prn>> avoid albuterol given atrial fibrillation -Currently on oral steroids -O2 sats 98% on room air   Paroxysmal A-fib/flutter with RVR: Found to be in AF RVR with rate 172 in cardiology clinic.   -Suspect acute COPD exacerbation was the trigger for recurrent A-fib -Started on IV Cardizem in the ER with improved heart rate and now back on Cardizem CD 120 mg daily  -Converted back to normal sinus rhythm yesterday but now back in atrial flutter with variable block -He had been on amiodarone daily but this was decreased by Dr. Elease Hashimoto to 100 mg 3 times a week on office visit 10/07/2022 because he was maintaining normal rhythm -His LFTs been normal as well as TSH in May -Since we cannot titrate his CCB any further due to soft blood pressure I am going to increase his amiodarone to 200 mg daily to see if we can get him back in normal sinus and keep him there plans to send out on 100 mg daily since he has had bradycardia in the past and sinus -continue Cardizem CD 120 mg daily>> cannot titrate higher because of soft blood pressure -continue Xarelto 20 mg daily   Acute on Chronic HFpEF: Last EF 55-60%.  Has some increased swelling to lower extremities and small stable left pleural effusion on CXR.  Not hypoxic. 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  Follows with oncology Dr. Arbutus Ped.  Hyperlipidemia -LDL goal < 70 -check FLP in am -Continue atorvastatin 80 mg daily  Hypertension -BP currently soft  -Currently on low-dose Cardizem CD 120 mg    Total time spent with patient today 40 minutes. This includes reviewing records, evaluating the patient and coordinating care. Face-to-face time >50%.  For questions or updates, please contact [facility] Please consult [email or web address] for contact info under        Signed, Armanda Magic, MD  08/06/2023, 7:47 AM

## 2023-08-06 NOTE — Progress Notes (Signed)
This chaplain responded to PMT NP-Eric consult for creating/updating the Pt. Advance Directive:  HCPOA and Living Will.  Family is not at the bedside.  The Pt. participated in AD education and answered clarifying questions with the chaplain. The chaplain understands the Pt. is choosing [employer] as his healthcare agent.  The Pt. Living Will documents the Pt. desire for his life not be prolonged by life prolonging measures. The Pt. communicated he does not give his healthcare agent permission to give instructions that differ from his Living Will.  This document supercedes any previous Advance Directives.  The chaplain is present with the Pt., notary, and witnesses for notarizing of the Pt. Advance Directive.  The chaplain gave the Pt. the original AD along with one copy. The chaplain scanned the Pt. AD into the Pt. EMR.  The Pt. is available for F/U spiritual care as needed.  Chaplain Stephanie Acre [phone]

## 2023-08-06 NOTE — TOC Initial Note (Signed)
Transition of Care [facility]) - Initial/Assessment Note    Patient Details  Name: Robert Bates MRN: [id number] Date of Birth: [birth year]  Transition of Care [facility] CM/SW Contact:    Gala Lewandowsky, RN Phone Number: 08/06/2023, 2:08 PM  Clinical Narrative: Risk for readmission assessment completed. Patient presented for shortness of breath. PTA patient was independent from home with support of adult son. Case Manager spoke with niece Steward Drone and she states patient still drives and wants to remain independent. [facility]. Palliative is following the patient and the patient is agreeable to outpatient palliative services. Referral submitted to [facility]. Patient has DME: nebulizer machine in the home. Case Manager will continue to follow for transition of care needs as the patient progresses.                  Expected Discharge Plan: Home/Self Care Barriers to Discharge: Continued Medical Work up   Patient Goals and CMS Choice Patient states their goals for this hospitalization and ongoing recovery are:: to return home.  Expected Discharge Plan and Services In-house Referral: NA Discharge Planning Services: CM Consult Post Acute Care Choice: NA Living arrangements for the past 2 months: Single Family Home                 DME Arranged: N/A DME Agency: NA  Prior Living Arrangements/Services Living arrangements for the past 2 months: Single Family Home Lives with:: Adult Children Patient language and need for interpreter reviewed:: Yes Do you feel safe going back to the place where you live?: Yes      Need for Family Participation in Patient Care: Yes (Comment) Care giver support system in place?: Yes (comment) Current home services: DME (Nebulizer machine) Criminal Activity/Legal Involvement Pertinent to Current Situation/Hospitalization: No - Comment as needed  Activities of Daily Living   ADL Screening (condition at time of  admission) Does the patient have a NEW difficulty with bathing/dressing/toileting/self-feeding that is expected to last >3 days?: No Does the patient have a NEW difficulty with getting in/out of bed, walking, or climbing stairs that is expected to last >3 days?: No Does the patient have a NEW difficulty with communication that is expected to last >3 days?: No Is the patient deaf or have difficulty hearing?: Yes Does the patient have difficulty seeing, even when wearing glasses/contacts?: Yes (blurry) Does the patient have difficulty concentrating, remembering, or making decisions?: Yes (short term memory)  Permission Sought/Granted Permission sought to share information with : Family Supports, Magazine features editor, Case Estate manager/land agent granted to share information with : Yes, Verbal Permission Granted     Permission granted to share info w AGENCY: [facility] (Palliative)        Emotional Assessment Appearance:: Appears stated age       Alcohol / Substance Use: Not Applicable Psych Involvement: No (comment)  Admission diagnosis:  COPD exacerbation (HCC) [J44.1] Atrial fibrillation with rapid ventricular response (HCC) [I48.91] COPD with acute exacerbation (HCC) [J44.1] Atrial fibrillation with RVR (HCC) [I48.91] Patient Active Problem List   Diagnosis Date Noted   Pure hypercholesterolemia 08/05/2023   Atrial fibrillation with RVR (HCC) 08/05/2023   Chronic atrial fibrillation with RVR (HCC) 07/29/2023   Cramping of hands 07/29/2023   Abdominal pain, acute, left lower quadrant 07/22/2023   Acute on chronic diastolic heart failure (HCC) 05/09/2023   Exertional shortness of breath 04/23/2023   SOB (shortness of breath) 04/23/2023   CHF (congestive heart failure) (HCC)  04/22/2023   Balanitis 04/07/2023   Phimosis 03/24/2023   Chest pain 03/24/2023   Grief reaction 01/17/2023   Bilateral impacted cerumen 11/05/2022   Urinary retention 11/05/2022    Tinnitus of both ears 08/16/2022   Musculoskeletal pain 07/17/2022   Pleural effusion 05/20/2022   Aortic dilatation (HCC) 05/13/2022   Dyspnea on exertion 05/06/2022   Thoracic aortic aneurysm (HCC) 04/04/2022   Lower urinary tract symptoms (LUTS) 03/15/2022   Thoracic radiculopathy 03/15/2022   Chronic heart failure with preserved ejection fraction (HFpEF) (HCC) 03/15/2022   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/15/2022   Unilateral inguinal hernia without obstruction or gangrene 01/15/2022   COPD exacerbation (HCC) 10/03/2021   Persistent atrial fibrillation (HCC) 05/28/2021   Secondary hypercoagulable state (HCC) 05/28/2021   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   Coronary artery disease 05/08/2021   COVID-19 virus infection 04/23/2021   Erectile dysfunction 04/11/2021   Head trauma 03/07/2021   Left leg pain 04/07/2020   Benign prostatic hyperplasia with nocturia 03/08/2020   Seizures (HCC) 02/18/2020   Atrial fibrillation with rapid ventricular response (HCC) 08/15/2017   Diabetes type 2, controlled (HCC) 07/31/2017   COPD GOLD II  04/03/2017   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) 09/06/2015   Lung cancer (HCC) 11/07/2014   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   Primary hypertension 04/29/2014   Meningioma (HCC) 10/25/2013   Low back pain 10/25/2013   Osteoarthritis 08/18/2012   Bronchitis with bronchospasm 08/14/2011   Atypical chest pain 08/14/2011   KERATOSIS 10/09/2010   SCIATICA, RIGHT 10/09/2010   Hearing loss 05/21/2010   Hyperlipidemia LDL goal <70 05/14/2010   Leukocytosis 05/14/2010   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   RENAL CYST, RIGHT 02/05/2010   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HCC) 01/29/2010   Lipoma 11/17/2009   MICROSCOPIC HEMATURIA 08/11/2008   GERD 07/26/2008   Benign neoplasm 07/25/2008   PCP:  Sandford Craze, NP Pharmacy:   [facility] [place] [facility] [address], [address] [place]  [address] Phone: [phone] Fax: [phone]  [facility] - [place], [place] - [address] [place]. [address] [place] [address] Phone: [phone] Fax: [phone]  [facility] [address] [place] - [address] [place] [address] Phone: [phone] Fax: [phone]  Social Determinants of Health (SDOH) Social History: SDOH Screenings   Food Insecurity: No Food Insecurity (08/04/2023)  Housing: Low Risk  (08/04/2023)  Transportation Needs: No Transportation Needs (08/04/2023)  Utilities: Not At Risk (08/04/2023)  Alcohol Screen: Low Risk  (10/02/2022)  Depression (PHQ2-9): Low Risk  (10/02/2022)  Financial Resource Strain: Low Risk  (05/22/2023)  Physical Activity: Insufficiently Active (08/05/2021)  Social Connections: Moderately Isolated (05/13/2022)  Stress: Stress Concern Present (05/22/2023)  Tobacco Use: Medium Risk (08/04/2023)   Readmission Risk Interventions    08/06/2023    2:06 PM 05/15/2021    3:55 PM  Readmission Risk Prevention Plan  Transportation Screening Complete Complete  PCP or Specialist Appt within 5-7 Days  Complete  Home Care Screening  Complete  Medication Review (RN CM)  Complete  HRI or Home Care Consult Complete   Social Work Consult for Recovery Care Planning/Counseling Complete   Palliative Care Screening Complete   Medication Review Oceanographer) Referral to Pharmacy

## 2023-08-07 ENCOUNTER — Encounter: Payer: Self-pay | Admitting: Family

## 2023-08-07 ENCOUNTER — Inpatient Hospital Stay (HOSPITAL_COMMUNITY): Payer: Medicare Other

## 2023-08-07 DIAGNOSIS — E7800 Pure hypercholesterolemia, unspecified: Secondary | ICD-10-CM | POA: Diagnosis not present

## 2023-08-07 DIAGNOSIS — J441 Chronic obstructive pulmonary disease with (acute) exacerbation: Secondary | ICD-10-CM | POA: Diagnosis not present

## 2023-08-07 DIAGNOSIS — I2510 Atherosclerotic heart disease of native coronary artery without angina pectoris: Secondary | ICD-10-CM | POA: Diagnosis not present

## 2023-08-07 DIAGNOSIS — I4891 Unspecified atrial fibrillation: Secondary | ICD-10-CM | POA: Diagnosis not present

## 2023-08-07 LAB — CBC
HCT: 39.3 % (ref 39.0–52.0)
Hemoglobin: 12.2 g/dL — ABNORMAL LOW (ref 13.0–17.0)
MCH: 27.4 pg (ref 26.0–34.0)
MCHC: 31 g/dL (ref 30.0–36.0)
MCV: 88.3 fL (ref 80.0–100.0)
Platelets: 283 10*3/uL (ref 150–400)
RBC: 4.45 MIL/uL (ref 4.22–5.81)
RDW: 17.4 % — ABNORMAL HIGH (ref 11.5–15.5)
WBC: 13.2 10*3/uL — ABNORMAL HIGH (ref 4.0–10.5)
nRBC: 0 % (ref 0.0–0.2)

## 2023-08-07 LAB — BASIC METABOLIC PANEL
Anion gap: 11 (ref 5–15)
BUN: 29 mg/dL — ABNORMAL HIGH (ref 8–23)
CO2: 27 mmol/L (ref 22–32)
Calcium: 8.6 mg/dL — ABNORMAL LOW (ref 8.9–10.3)
Chloride: 97 mmol/L — ABNORMAL LOW (ref 98–111)
Creatinine, Ser: 1.16 mg/dL (ref 0.61–1.24)
GFR, Estimated: >60 mL/min (ref >60)
Glucose, Bld: 222 mg/dL — ABNORMAL HIGH (ref 70–99)
Potassium: 4.8 mmol/L (ref 3.5–5.1)
Sodium: 135 mmol/L (ref 135–145)

## 2023-08-07 LAB — LIPID PANEL
Cholesterol: 142 mg/dL (ref 0–200)
HDL: 79 mg/dL (ref >40)
LDL Cholesterol: 47 mg/dL (ref 0–99)
Total CHOL/HDL Ratio: 1.8 {ratio}
Triglycerides: 81 mg/dL (ref <150)
VLDL: 16 mg/dL (ref 0–40)

## 2023-08-07 LAB — GLUCOSE, CAPILLARY
Glucose-Capillary: 213 mg/dL — ABNORMAL HIGH (ref 70–99)
Glucose-Capillary: 146 mg/dL — ABNORMAL HIGH (ref 70–99)
Glucose-Capillary: 260 mg/dL — ABNORMAL HIGH (ref 70–99)
Glucose-Capillary: 212 mg/dL — ABNORMAL HIGH (ref 70–99)

## 2023-08-07 MED ORDER — AMIODARONE LOAD VIA INFUSION
150.0000 mg | Freq: Once | INTRAVENOUS | Status: AC
  Administered 2023-08-07: 150 mg via INTRAVENOUS
  Filled 2023-08-07: qty 83.34

## 2023-08-07 MED ORDER — FUROSEMIDE 10 MG/ML IJ SOLN
40.0000 mg | Freq: Every day | INTRAMUSCULAR | Status: DC
  Administered 2023-08-07 – 2023-08-09 (×3): 40 mg via INTRAVENOUS
  Filled 2023-08-07 (×3): qty 4

## 2023-08-07 MED ORDER — AMIODARONE HCL IN DEXTROSE 360-4.14 MG/200ML-% IV SOLN
60.0000 mg/h | INTRAVENOUS | Status: AC
  Administered 2023-08-07 (×2): 60 mg/h via INTRAVENOUS
  Filled 2023-08-07: qty 200

## 2023-08-07 MED ORDER — AMIODARONE HCL IN DEXTROSE 360-4.14 MG/200ML-% IV SOLN
30.0000 mg/h | INTRAVENOUS | Status: DC
  Administered 2023-08-07 – 2023-08-09 (×3): 30 mg/h via INTRAVENOUS
  Filled 2023-08-07 (×4): qty 200

## 2023-08-07 MED ORDER — SODIUM CHLORIDE 0.9 % IV SOLN: INTRAVENOUS | Status: DC | PRN

## 2023-08-07 NOTE — Progress Notes (Signed)
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  She was treated for COPD exacerbation as outpatient with prednisone taper but did not relief.  Recently seen at PCP office and started on Cardizem for rapid A-fib.  Was seen today at cardiology clinic, found to have rapid A-fib and shortness of breath with active wheezing, heart rate 178 was sent to ER. In the emergency room blood pressure is stable.  Heart rate 107 and fluctuates to 160.  91% on room air.  COVID, influenza and RSV negative.  Chest x-ray similar to previous.  Given steroids, magnesium, Cardizem and DuoNebs and admitted to the hospital.  Cardiology following.   Assessment & Plan:   COPD with acute exacerbation Aggressive bronchodilator therapy, IV steroids- resumed-- transition to PO in a few days, inhalational steroids, scheduled and as needed bronchodilators, deep breathing exercises, incentive spirometry, chest physiotherapy. Supplemental oxygen to keep saturations more than 90%. -home O2 eval- normal -repeat x ray   Paroxysmal A-fib with RVR: Heart rate fluctuates.  Currently on Cardizem 120 mg daily, on Xarelto.   -still having trouble controlling HR -cardiology following  Chronic heart failure: Known ejection fraction 55 to 60%.  Present with increased lower extremity swelling and stable left pleural effusion.  Not hypoxemic. Continue oral Lasix, spironolactone.  History of bradycardia with beta-blockers, not on any beta-blockers.  CAD status post  CABG: Stable. Type 2 diabetes: On oral hypoglycemics at home.  Currently on SSI. 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  Follows with oncology Dr. Arbutus Ped  DNR with limited intervention Patient may benefit with community-based palliative or hospice care. Palliative care as outpatient     DVT prophylaxis: Place TED hose Start: 08/06/23 0810 Place TED hose Start: 08/05/23 1323 rivaroxaban (XARELTO) tablet 20 mg   Code Status: DNR/DNI limited intervention Family Communication: None at the bedside Disposition Plan: Status is: inpt     Consultants:  Cardiology    Subjective: HR still fluctuating   Objective: Vitals:   08/07/23 0609 08/07/23 0730 08/07/23 0752 08/07/23 0950  BP: 116/88 92/69  100/68  Pulse:  (!) 111 (!) 107   Resp:  20 19   Temp:  (!) 97.4 F (36.3 C)    TempSrc:  Oral    SpO2:  94% 97%   Weight:      Height:        Intake/Output Summary (Last 24 hours) at 08/07/2023 1051 Last data filed at 08/07/2023 1030 Gross per 24 hour  Intake 537 ml  Output 1980 ml  Net -1443 ml   Filed Weights   08/05/23 0230 08/06/23 0335  Weight: 79.8 kg 77.6 kg    Examination:   General: Appearance:     Overweight male sitting on side of bed     Lungs:     Diminished on left  Heart:    Tachycardic. irregular  MS:   All extremities are intact.   Neurologic:   Awake, alert, oriented x 3. No apparent focal neurological           defect.  Data Reviewed: I have personally reviewed following labs and imaging studies  CBC: Recent Labs  Lab 08/04/23 1328 08/04/23 1344 08/05/23 0231 08/07/23 0404  WBC 13.4*  --  14.6* 13.2*  HGB 12.6* 13.6 12.2* 12.2*  HCT 41.1 40.0 39.2 39.3  MCV 88.4  --  89.1 88.3  PLT 291  --  288 283   Basic Metabolic Panel: Recent Labs  Lab 08/04/23 1328 08/04/23 1344 08/05/23 0231 08/07/23 0404  NA 137 135 135 135  K 3.9 3.8 4.3 4.8  CL 102  --  99 97*  CO2 23  --  23 27   GLUCOSE 127*  --  279* 222*  BUN 27*  --  27* 29*  CREATININE 0.95  --  1.09 1.16  CALCIUM 8.9  --  8.5* 8.6*   GFR: Estimated Creatinine Clearance: 47.5 mL/min (by C-G formula based on SCr of 1.16 mg/dL). Liver Function Tests: No results for input(s): "AST", "ALT", "ALKPHOS", "BILITOT", "PROT", "ALBUMIN" in the last 168 hours. No results for input(s): "LIPASE", "AMYLASE" in the last 168 hours. No results for input(s): "AMMONIA" in the last 168 hours. Coagulation Profile: No results for input(s): "INR", "PROTIME" in the last 168 hours. Cardiac Enzymes: No results for input(s): "CKTOTAL", "CKMB", "CKMBINDEX", "TROPONINI" in the last 168 hours. BNP (last 3 results) No results for input(s): "PROBNP" in the last 8760 hours. HbA1C: No results for input(s): "HGBA1C" in the last 72 hours. CBG: Recent Labs  Lab 08/06/23 0853 08/06/23 1230 08/06/23 1703 08/06/23 2041 08/07/23 0713  GLUCAP 167* 107* 249* 216* 213*   Lipid Profile: Recent Labs    08/07/23 0404  CHOL 142  HDL 79  LDLCALC 47  TRIG 81  CHOLHDL 1.8   Thyroid Function Tests: No results for input(s): "TSH", "T4TOTAL", "FREET4", "T3FREE", "THYROIDAB" in the last 72 hours. Anemia Panel: No results for input(s): "VITAMINB12", "FOLATE", "FERRITIN", "TIBC", "IRON", "RETICCTPCT" in the last 72 hours. Sepsis Labs: No results for input(s): "PROCALCITON", "LATICACIDVEN" in the last 168 hours.  Recent Results (from the past 240 hour(s))  Resp panel by RT-PCR (RSV, Flu A&B, Covid) Anterior Nasal Swab     Status: None   Collection Time: 08/04/23  1:14 PM   Specimen: Anterior Nasal Swab  Result Value Ref Range Status   SARS Coronavirus 2 by RT PCR NEGATIVE NEGATIVE Final   Influenza A by PCR NEGATIVE NEGATIVE Final   Influenza B by PCR NEGATIVE NEGATIVE Final    Comment: (NOTE) The Xpert Xpress SARS-CoV-2/FLU/RSV plus assay is intended as an aid in the diagnosis of influenza from Nasopharyngeal swab specimens and should  not be used as a sole basis for treatment. Nasal washings and aspirates are unacceptable for Xpert Xpress SARS-CoV-2/FLU/RSV testing.  Fact Sheet for Patients: BloggerCourse.com  Fact Sheet for Healthcare Providers: SeriousBroker.it  This test is not yet approved or cleared by the [place] FDA and has been authorized for detection and/or diagnosis of SARS-CoV-2 by FDA under an Emergency Use Authorization (EUA). This EUA will remain in effect (meaning this test can be used) for the duration of the COVID-19 declaration under Section 564(b)(1) of the Act, 21 U.S.C. section 360bbb-3(b)(1), unless the authorization is terminated or revoked.     Resp Syncytial Virus by PCR NEGATIVE NEGATIVE Final    Comment: (NOTE) Fact Sheet for Patients: BloggerCourse.com  Fact Sheet for Healthcare Providers: SeriousBroker.it  This test is not yet approved or cleared by the [place] FDA and has been authorized for detection and/or diagnosis  of SARS-CoV-2 by FDA under an Emergency Use Authorization (EUA). This EUA will remain in effect (meaning this test can be used) for the duration of the COVID-19 declaration under Section 564(b)(1) of the Act, 21 U.S.C. section 360bbb-3(b)(1), unless the authorization is terminated or revoked.  Performed at [facility], [address] [place], [place] [address]          Radiology Studies: No results found.      Scheduled Meds:  arformoterol  15 mcg Nebulization BID   atorvastatin  80 mg Oral Daily   budesonide (PULMICORT) nebulizer solution  0.25 mg Nebulization BID   diltiazem  120 mg Oral Daily   furosemide  40 mg Intravenous Daily   insulin aspart  0-9 Units Subcutaneous TID WC   methylPREDNISolone (SOLU-MEDROL) injection  125 mg Intravenous Q24H   potassium chloride  10 mEq Oral Daily   rivaroxaban  20 mg Oral QPM   sodium  chloride flush  3 mL Intravenous Q12H   spironolactone  25 mg Oral Daily   tamsulosin  0.4 mg Oral Daily   Continuous Infusions:  sodium chloride 5 mL/hr at 08/07/23 0950   amiodarone 60 mg/hr (08/07/23 0937)   Followed by   amiodarone       LOS: 2 days    Time spent: 40 minutes    Joseph Art, DO [facility]

## 2023-08-07 NOTE — Progress Notes (Signed)
SATURATION QUALIFICATIONS: (This note is used to comply with regulatory documentation for home oxygen)  Patient Saturations on Room Air at Rest = 98%  Patient Saturations on Room Air while Ambulating = 96%   Please briefly explain why patient needs home oxygen: HR increased to 160`s

## 2023-08-07 NOTE — Care Management Important Message (Signed)
Important Message  Patient Details  Name: Robert Bates MRN: [id number] Date of Birth: 04-Aug-1941   Important Message Given:  Yes - Medicare IM     Sherilyn Banker 08/07/2023, 2:51 PM

## 2023-08-07 NOTE — Inpatient Diabetes Management (Signed)
Inpatient Diabetes Program Recommendations  AACE/ADA: New Consensus Statement on Inpatient Glycemic Control  Target Ranges:  Prepandial:   less than 140 mg/dL      Peak postprandial:   less than 180 mg/dL (1-2 hours)      Critically ill patients:  140 - 180 mg/dL    Latest Reference Range & Units 08/06/23 08:53 08/06/23 12:30 08/06/23 17:03 08/06/23 20:41 08/07/23 07:13  Glucose-Capillary 70 - 99 mg/dL 725 (H) 366 (H) 440 (H) 216 (H) 213 (H)   Review of Glycemic Control  Diabetes history: DM2 Outpatient Diabetes medications: Glipizide 2.5 mg daily Current orders for Inpatient glycemic control: Novolog 0-9 units TID with meals; Solumedrol 125 mg Q24H  Inpatient Diabetes Program Recommendations:    Insulin: If steroids are continued, please consider ordering Semglee 5 units Q24H and adding Novlog 0-5 units at bedtime for bedtime correction.  Thanks, Orlando Penner, RN, MSN, CDCES Diabetes Coordinator Inpatient Diabetes Program [phone] (Team Pager from 8am to 5pm)

## 2023-08-07 NOTE — Progress Notes (Signed)
Progress Note  Patient Name: Robert Bates Date of Encounter: 08/07/2023  [facility] Cardiologist: Kristeen Miss, MD   Subjective   Converted to NSR 10/1 but then went back into atrial fibrillation with RVR 10/2.  He was given Cardizem 5 mg IV followed by another 5 mg.  Remains in aflutter now with RVR Inpatient Medications    Scheduled Meds:  amiodarone  200 mg Oral Daily   arformoterol  15 mcg Nebulization BID   atorvastatin  80 mg Oral Daily   budesonide (PULMICORT) nebulizer solution  0.25 mg Nebulization BID   diltiazem  120 mg Oral Daily   insulin aspart  0-9 Units Subcutaneous TID WC   methylPREDNISolone (SOLU-MEDROL) injection  125 mg Intravenous Q24H   potassium chloride  10 mEq Oral Daily   rivaroxaban  20 mg Oral QPM   sodium chloride flush  3 mL Intravenous Q12H   spironolactone  25 mg Oral Daily   tamsulosin  0.4 mg Oral Daily   Continuous Infusions:  PRN Meds: acetaminophen **OR** acetaminophen, levalbuterol, metoprolol tartrate, ondansetron **OR** ondansetron (ZOFRAN) IV, senna-docusate   Vital Signs    Vitals:   08/07/23 0538 08/07/23 0609 08/07/23 0730 08/07/23 0752  BP:  116/88    Pulse: 98  (!) 111 (!) 107  Resp:    19  Temp:   (!) 97.4 F (36.3 C)   TempSrc:   Oral   SpO2: 95%  94% 97%  Weight:      Height:        Intake/Output Summary (Last 24 hours) at 08/07/2023 0830 Last data filed at 08/07/2023 0800 Gross per 24 hour  Intake 357 ml  Output 1580 ml  Net -1223 ml      08/06/2023    3:35 AM 08/05/2023    2:30 AM 08/04/2023   11:50 AM  Last 3 Weights  Weight (lbs) 171 lb 1.6 oz 176 lb 174 lb  Weight (kg) 77.61 kg 79.833 kg 78.926 kg      Telemetry    Atrial flutter with RVR in the 160's personally Reviewed  ECG    No new EKG- Personally Reviewed  Physical Exam   GEN: Well nourished, well developed in no acute distress HEENT: Normal NECK: No JVD; No carotid bruits LYMPHATICS: No lymphadenopathy CARDIAC:irregularly  irregular and tachy, no murmurs, rubs, gallops RESPIRATORY:  diffuse wheezes ABDOMEN: Soft, non-tender, non-distended MUSCULOSKELETAL:  2+ BLE pitting edema; No deformity  SKIN: Warm and dry NEUROLOGIC:  Alert and oriented x 3 PSYCHIATRIC:  Normal affect  Labs    High Sensitivity Troponin:  No results for input(s): "TROPONINIHS" in the last 720 hours.    Chemistry Recent Labs  Lab 08/04/23 1328 08/04/23 1344 08/05/23 0231 08/07/23 0404  NA 137 135 135 135  K 3.9 3.8 4.3 4.8  CL 102  --  99 97*  CO2 23  --  23 27  GLUCOSE 127*  --  279* 222*  BUN 27*  --  27* 29*  CREATININE 0.95  --  1.09 1.16  CALCIUM 8.9  --  8.5* 8.6*  GFRNONAA >60  --  >60 >60  ANIONGAP 12  --  13 11     Hematology Recent Labs  Lab 08/04/23 1328 08/04/23 1344 08/05/23 0231 08/07/23 0404  WBC 13.4*  --  14.6* 13.2*  RBC 4.65  --  4.40 4.45  HGB 12.6* 13.6 12.2* 12.2*  HCT 41.1 40.0 39.2 39.3  MCV 88.4  --  89.1 88.3  MCH 27.1  --  27.7 27.4  MCHC 30.7  --  31.1 31.0  RDW 17.1*  --  17.0* 17.4*  PLT 291  --  288 283    BNP Recent Labs  Lab 08/04/23 1328  BNP 134.1*     DDimer No results for input(s): "DDIMER" in the last 168 hours.   CHA2DS2-VASc Score = 6   This indicates a 9.7% annual risk of stroke. The patient's score is based upon: CHF History: 1 HTN History: 1 Diabetes History: 1 Stroke History: 0 Vascular Disease History: 1 Age Score: 2 Gender Score: 0   Radiology    No results found.  Patient Profile     82 y.o. male Robert Bates is a 82 y.o. 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  Assessment & Plan    COPD with acute exacerbation: Failed outpatient management.  Symptoms  improving with IV steroids and nebulizers administered in the ED  -Shortness of breath improved but still has wheezing on exam -Currently on Brovana/Pulmicort BID, Xopenex prn>> avoid albuterol given atrial fibrillation -Currently on oral steroids -O2 sats 98% on room air   Paroxysmal A-fib/flutter with RVR: Found to be in AF RVR with rate 172 in cardiology clinic.   -Suspect acute COPD exacerbation was the trigger for recurrent A-fib -Started on IV Cardizem in the ER with improved heart rate and now back on Cardizem CD 120 mg daily  -Converted back to normal sinus rhythm  but now back in atrial flutter/fib with RVR  -He had been on amiodarone daily but this was decreased by Dr. Elease Hashimoto to 100 mg 3 times a week on office visit 10/07/2022 because he was maintaining normal rhythm -His LFTs been normal as well as TSH in May -could not titrate CCB any further due to soft blood pressure  -increased Amio to 200mg  daily but now in afib with RVR -continue Cardizem CD 120 mg daily>> cannot titrate higher because of soft blood pressure -stop PO Amio and give 150 mg IV bolus of amnio followed by a drip at 60 mg/h x 6 hours then 30 mg/h -continue Xarelto 20 mg daily>> he has not missed any doses of Xarelto in the past 4 weeks.  At this point would not proceed with cardioversion until his lungs are better as I think he may revert back into atrial flutter. -Unfortunately no real good options.  I suspect that his underlying lung problems and steroid use are driving his a atrial arrhythmias.  Treatment limited by soft blood pressures.   Acute on Chronic HFpEF: Last EF 55-60%.  Has some increased swelling to lower extremities and small stable left pleural effusion on CXR.  Not hypoxic. -I's and O's are incomplete -Still has significant lower extremity edema BNP mildly elevated yesterday -Would like to try to get him as dry as possible given his underlying lung issues -Gave 40 mg IV Lasix yesterday -Put out  1.83 L yesterday and is net -1.47 L since admission. 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  Follows with oncology Dr. Arbutus Ped.  Hyperlipidemia -LDL goal < 70 -LDL 47 and HDL 79 this admission -Continue atorvastatin 80 mg daily  Hypertension -BP actually soft -Currently on Cardizem CD 120 mg daily for heart rate control but cannot titrate further.    Total time spent with patient today 35 minutes. This includes reviewing records, evaluating the patient and coordinating care. Face-to-face time >50%.  For questions or updates, please contact [facility] Please consult [email or web address] for contact info under        Signed, Armanda Magic, MD  08/07/2023, 8:30 AM

## 2023-08-07 NOTE — Plan of Care (Signed)

## 2023-08-08 ENCOUNTER — Other Ambulatory Visit (HOSPITAL_BASED_OUTPATIENT_CLINIC_OR_DEPARTMENT_OTHER): Payer: Self-pay

## 2023-08-08 DIAGNOSIS — I4891 Unspecified atrial fibrillation: Secondary | ICD-10-CM | POA: Diagnosis not present

## 2023-08-08 DIAGNOSIS — J441 Chronic obstructive pulmonary disease with (acute) exacerbation: Secondary | ICD-10-CM | POA: Diagnosis not present

## 2023-08-08 DIAGNOSIS — I2510 Atherosclerotic heart disease of native coronary artery without angina pectoris: Secondary | ICD-10-CM | POA: Diagnosis not present

## 2023-08-08 DIAGNOSIS — E7800 Pure hypercholesterolemia, unspecified: Secondary | ICD-10-CM | POA: Diagnosis not present

## 2023-08-08 LAB — COMPREHENSIVE METABOLIC PANEL
ALT: 24 U/L (ref 0–44)
AST: 15 U/L (ref 15–41)
Albumin: 2.7 g/dL — ABNORMAL LOW (ref 3.5–5.0)
Alkaline Phosphatase: 65 U/L (ref 38–126)
Anion gap: 16 — ABNORMAL HIGH (ref 5–15)
BUN: 37 mg/dL — ABNORMAL HIGH (ref 8–23)
CO2: 29 mmol/L (ref 22–32)
Calcium: 9 mg/dL (ref 8.9–10.3)
Chloride: 92 mmol/L — ABNORMAL LOW (ref 98–111)
Creatinine, Ser: 1.14 mg/dL (ref 0.61–1.24)
GFR, Estimated: >60 mL/min (ref >60)
Glucose, Bld: 177 mg/dL — ABNORMAL HIGH (ref 70–99)
Potassium: 4.8 mmol/L (ref 3.5–5.1)
Sodium: 137 mmol/L (ref 135–145)
Total Bilirubin: 0.3 mg/dL (ref 0.3–1.2)
Total Protein: 5.1 g/dL — ABNORMAL LOW (ref 6.5–8.1)

## 2023-08-08 LAB — CBC
HCT: 37.7 % — ABNORMAL LOW (ref 39.0–52.0)
Hemoglobin: 11.7 g/dL — ABNORMAL LOW (ref 13.0–17.0)
MCH: 26.9 pg (ref 26.0–34.0)
MCHC: 31 g/dL (ref 30.0–36.0)
MCV: 86.7 fL (ref 80.0–100.0)
Platelets: 277 10*3/uL (ref 150–400)
RBC: 4.35 MIL/uL (ref 4.22–5.81)
RDW: 17.2 % — ABNORMAL HIGH (ref 11.5–15.5)
WBC: 16.8 10*3/uL — ABNORMAL HIGH (ref 4.0–10.5)
nRBC: 0 % (ref 0.0–0.2)

## 2023-08-08 LAB — GLUCOSE, CAPILLARY
Glucose-Capillary: 187 mg/dL — ABNORMAL HIGH (ref 70–99)
Glucose-Capillary: 235 mg/dL — ABNORMAL HIGH (ref 70–99)
Glucose-Capillary: 268 mg/dL — ABNORMAL HIGH (ref 70–99)
Glucose-Capillary: 144 mg/dL — ABNORMAL HIGH (ref 70–99)

## 2023-08-08 LAB — TSH: TSH: 0.211 u[IU]/mL — ABNORMAL LOW (ref 0.350–4.500)

## 2023-08-08 LAB — MAGNESIUM: Magnesium: 2.2 mg/dL (ref 1.7–2.4)

## 2023-08-08 MED ORDER — PREDNISONE 20 MG PO TABS
40.0000 mg | ORAL_TABLET | Freq: Every day | ORAL | Status: DC
  Administered 2023-08-08 – 2023-08-09 (×2): 40 mg via ORAL
  Filled 2023-08-08 (×2): qty 2

## 2023-08-08 NOTE — Progress Notes (Signed)
Progress Note  Patient Name: Robert Bates Date of Encounter: 08/08/2023  [facility] Cardiologist: Kristeen Miss, MD   Subjective   Converted to sinus bradycardia since yesterday with heart rates in the mid 50s.  Denies any chest pain or shortness of breath.    Inpatient Medications    Scheduled Meds:  arformoterol  15 mcg Nebulization BID   atorvastatin  80 mg Oral Daily   budesonide (PULMICORT) nebulizer solution  0.25 mg Nebulization BID   diltiazem  120 mg Oral Daily   furosemide  40 mg Intravenous Daily   insulin aspart  0-9 Units Subcutaneous TID WC   potassium chloride  10 mEq Oral Daily   predniSONE  40 mg Oral Q breakfast   rivaroxaban  20 mg Oral QPM   sodium chloride flush  3 mL Intravenous Q12H   spironolactone  25 mg Oral Daily   tamsulosin  0.4 mg Oral Daily   Continuous Infusions:  sodium chloride 5 mL/hr at 08/07/23 1906   amiodarone 30 mg/hr (08/08/23 0111)   PRN Meds: sodium chloride, acetaminophen **OR** acetaminophen, levalbuterol, ondansetron **OR** ondansetron (ZOFRAN) IV, senna-docusate   Vital Signs    Vitals:   08/07/23 2002 08/07/23 2342 08/08/23 0353 08/08/23 0356  BP: 116/68 99/76  (!) 143/84  Pulse: 84 63  64  Resp: 20 19  18   Temp: (!) 97.5 F (36.4 C) (!) 97.4 F (36.3 C)  97.8 F (36.6 C)  TempSrc: Oral Oral  Oral  SpO2: 96% 98%  99%  Weight:   76.7 kg   Height:        Intake/Output Summary (Last 24 hours) at 08/08/2023 0747 Last data filed at 08/08/2023 0114 Gross per 24 hour  Intake 1140.77 ml  Output 1350 ml  Net -209.23 ml      08/08/2023    3:53 AM 08/06/2023    3:35 AM 08/05/2023    2:30 AM  Last 3 Weights  Weight (lbs) 169 lb 171 lb 1.6 oz 176 lb  Weight (kg) 76.658 kg 77.61 kg 79.833 kg      Telemetry  Sinus bradycardia in the 50s personally Reviewed  ECG    No new EKG- Personally Reviewed  Physical Exam   GEN: Well nourished, well developed in no acute distress HEENT: Normal NECK: No JVD; No  carotid bruits LYMPHATICS: No lymphadenopathy CARDIAC:RRR, no murmurs, rubs, gallops RESPIRATORY: Decreased breath sounds from admission ABDOMEN: Soft, non-tender, non-distended MUSCULOSKELETAL:  1+ BLE edema; No deformity  SKIN: Warm and dry NEUROLOGIC:  Alert and oriented x 3 PSYCHIATRIC:  Normal affect  Labs    High Sensitivity Troponin:  No results for input(s): "TROPONINIHS" in the last 720 hours.    Chemistry Recent Labs  Lab 08/05/23 0231 08/07/23 0404 08/08/23 0519  NA 135 135 137  K 4.3 4.8 4.8  CL 99 97* 92*  CO2 23 27 29   GLUCOSE 279* 222* 177*  BUN 27* 29* 37*  CREATININE 1.09 1.16 1.14  CALCIUM 8.5* 8.6* 9.0  PROT  --   --  5.1*  ALBUMIN  --   --  2.7*  AST  --   --  15  ALT  --   --  24  ALKPHOS  --   --  65  BILITOT  --   --  0.3  GFRNONAA >60 >60 >60  ANIONGAP 13 11 16*     Hematology Recent Labs  Lab 08/05/23 0231 08/07/23 0404 08/08/23 0519  WBC 14.6* 13.2* 16.8*  RBC 4.40 4.45 4.35  HGB 12.2* 12.2* 11.7*  HCT 39.2 39.3 37.7*  MCV 89.1 88.3 86.7  MCH 27.7 27.4 26.9  MCHC 31.1 31.0 31.0  RDW 17.0* 17.4* 17.2*  PLT 288 283 277    BNP Recent Labs  Lab 08/04/23 1328  BNP 134.1*     DDimer No results for input(s): "DDIMER" in the last 168 hours.   CHA2DS2-VASc Score = 6   This indicates a 9.7% annual risk of stroke. The patient's score is based upon: CHF History: 1 HTN History: 1 Diabetes History: 1 Stroke History: 0 Vascular Disease History: 1 Age Score: 2 Gender Score: 0   Radiology    DG CHEST PORT 1 VIEW  Result Date: 08/07/2023 CLINICAL DATA:  Shortness of breath EXAM: PORTABLE CHEST 1 VIEW COMPARISON:  03/24/2023, CT 01/23/2023, 08/04/2023 FINDINGS: Post sternotomy changes and left atrial appendage clip. Left hilar opacity and distortion is unchanged. Right lung grossly clear. Small left-sided pleural effusion appears slightly increased. Left basilar airspace disease. Volume loss left thorax IMPRESSION: 1. Small left  effusion appears slightly increased. Left basilar airspace disease may reflect atelectasis or pneumonia. 2. Similar appearance of left hilar opacity and distortion presumably due to post therapy change. Electronically Signed   By: Jasmine Pang M.D.   On: 08/07/2023 15:15    Patient Profile     82 y.o. male Robert Bates is a 82 y.o. 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  Assessment & Plan    COPD with acute exacerbation: Failed outpatient management.  Symptoms improving with IV steroids and nebulizers administered in the ED  -Shortness of breath improved but still has wheezing on exam -Currently on Brovana/Pulmicort BID, Xopenex prn>> avoid albuterol given atrial fibrillation -Currently on oral steroids -O2 sats 99% on room air   Paroxysmal A-fib/flutter with RVR: Found to be in AF RVR with rate 172 in cardiology clinic.   -Suspect acute COPD exacerbation was the trigger for recurrent A-fib -Started on IV Cardizem in the ER with improved heart rate and now back on Cardizem CD 120 mg daily  -Converted back to normal sinus rhythm and then reverted back to atrial flutter/fib with RVR  -He had been on amiodarone daily but this was decreased by Dr. Elease Hashimoto to 100 mg 3 times a week on office visit 10/07/2022 because he was maintaining normal rhythm -His LFTs been normal as well as TSH in May -Have not been able to  titrate CCB any further due to soft blood pressure  -increased Amio to 200mg  daily but went back into flutter with RVR and started on IV Amio drip. -He has converted back to sinus bradycardia. -Suspect that his underlying lung problems and steroid use are driving his a atrial arrhythmias.   Treatment has limited by soft blood pressures. -Continue Xarelto 20 mg daily -Continue IV amiodarone for at least 24 hours and then transition back to oral -Continue Cardizem CD 220 mg daily   Acute on Chronic HFpEF: Last EF 55-60%.  Has some increased swelling to lower extremities and small stable left pleural effusion on CXR.  Not hypoxic. -I's and O's are incomplete -Still has significant lower extremity edema BNP mildly elevated yesterday -Would like to try to get him as  dry as possible given his underlying lung issues -Currently on Lasix 40 mg IV daily -He put out 1.3 L yesterday and is net -1.9 L since admission -Weight is down 2 lbs from yesterday and 7 ;bs since admission -Serum creatinine stable at 1.14 with potassium 4.8 -Still remains volume overloaded with lower extremity edema -Now continue Lasix 40 mg IV daily -Follow strict I's and O's, renal function and daily weights while diuresing -Continue compression hose for lower extremity edema -No beta-blocker due to history of bradycardia on his prior  -Continue spironolactone 25 mg daily continue spironolactone 25 mg daily -Not on SGLT2i due to history of yeast infection   CAD s/p CABG: -Denies any chest pain -No ASA due to DOAC -Continue atorvastatin 80 mg daily    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  Follows with oncology Dr. Arbutus Ped.  Hyperlipidemia -LDL goal < 70 -LDL 47 and HDL 79 this admission -Continue atorvastatin 80 mg daily  Hypertension -BP controlled at 143/84 mmHg -Currently on Cardizem CD 120 mg daily for heart rate control but cannot titrate further.    Total time spent with patient today 35 minutes. This includes reviewing records, evaluating the patient and coordinating care. Face-to-face time >50%.  For questions or updates, please contact  [facility] Please consult [email or web address] for contact info under        Signed, Armanda Magic, MD  08/08/2023,  7:47 AM

## 2023-08-08 NOTE — Progress Notes (Signed)
Received pt lying in the bed, NSR with HR 70's-80's and on Amiodarone drip at 30 mg/hr. Pt denies chest pain, denies nausea and vomiting, not in respiratory distress. Will continue to monitor pt.

## 2023-08-08 NOTE — Progress Notes (Signed)
PROGRESS NOTE    Robert Bates  XBM:[id number] DOB: Mar 23, 1941 DOA: 08/04/2023 PCP: Sandford Craze, NP    Brief Narrative:  82 year old with history of coronary artery disease status post CABG, chronic heart failure with preserved ejection fraction, paroxysmal A-fib on Xarelto, COPD, chronic left pleural effusion, squamous cell carcinoma of the lung status post curative radiotherapy, manageable, type 2 diabetes, hypertension hyperlipidemia presented to the emergency room from cardiology clinic with shortness of breath.  She was treated for COPD exacerbation as outpatient with prednisone taper but did not relief.  Recently seen at PCP office and started on Cardizem for rapid A-fib.  Was seen today at cardiology clinic, found to have rapid A-fib and shortness of breath with active wheezing, heart rate 178 was sent to ER. In the emergency room blood pressure is stable.  Heart rate 107 and fluctuates to 160.  91% on room air.  COVID, influenza and RSV negative.  Chest x-ray similar to previous.  Given steroids, magnesium, Cardizem and DuoNebs and admitted to the hospital.  Cardiology following.   Assessment & Plan:   COPD with acute exacerbation Aggressive bronchodilator therapy, IV steroids-- transition to PO, inhalational steroids, scheduled and as needed bronchodilators, deep breathing exercises, incentive spirometry, chest physiotherapy. Supplemental oxygen to keep saturations more than 90%. -home O2 eval- normal   Paroxysmal A-fib with RVR: Heart rate fluctuates.   - on Xarelto.   -HR improved on amio gtt-- plan to continue for 24 more hours along with PO cardizem-- BP continues to limit -cardiology following  Acute on Chronic heart failure: Known ejection fraction 55 to 60%.  Present with increased lower extremity swelling and stable left pleural effusion.  Not hypoxemic. - Lasix, spironolactone per cards  CAD status post CABG: Stable. Type 2 diabetes: On oral hypoglycemics at  home.  Currently on SSI. 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  Follows with oncology Dr. Arbutus Ped  DNR with limited intervention - Palliative care as outpatient     DVT prophylaxis: Place TED hose Start: 08/06/23 0810 Place TED hose Start: 08/05/23 1323 rivaroxaban (XARELTO) tablet 20 mg   Code Status: DNR/DNI limited intervention Family Communication: None at the bedside Disposition Plan: Status is: inpt     Consultants:  Cardiology    Subjective: HR improved and converted to sinus  Objective: Vitals:   08/08/23 0353 08/08/23 0356 08/08/23 0804 08/08/23 0819  BP:  (!) 143/84 113/73   Pulse:  64  67  Resp:  18 18 18   Temp:  97.8 F (36.6 C) 97.8 F (36.6 C)   TempSrc:  Oral Oral   SpO2:  99% 98% 98%  Weight: 76.7 kg     Height:        Intake/Output Summary (Last 24 hours) at 08/08/2023 1155 Last data filed at 08/08/2023 1000 Gross per 24 hour  Intake 600.77 ml  Output 1750 ml  Net -1149.23 ml   Filed Weights   08/05/23 0230 08/06/23 0335 08/08/23 0353  Weight: 79.8 kg 77.6 kg 76.7 kg    Examination:   General: Appearance:     Overweight male in no acute distress     Lungs:      respirations unlabored  Heart:    Normal heart rate.   MS:   All extremities are intact.   Neurologic:   Awake, alert      Data Reviewed: I have personally reviewed following labs and imaging studies  CBC: Recent Labs  Lab 08/04/23  1328 08/04/23 1344 08/05/23 0231 08/07/23 0404 08/08/23 0519  WBC 13.4*  --  14.6* 13.2* 16.8*  HGB 12.6* 13.6 12.2* 12.2* 11.7*  HCT 41.1 40.0 39.2 39.3 37.7*  MCV 88.4  --  89.1 88.3 86.7  PLT 291  --  288 283 277   Basic Metabolic Panel: Recent Labs  Lab 08/04/23 1328 08/04/23 1344 08/05/23 0231 08/07/23 0404 08/08/23 0519  NA 137 135 135 135 137  K 3.9 3.8 4.3 4.8 4.8  CL 102  --  99 97* 92*  CO2 23  --  23 27 29   GLUCOSE 127*  --  279* 222* 177*  BUN 27*  --  27* 29* 37*   CREATININE 0.95  --  1.09 1.16 1.14  CALCIUM 8.9  --  8.5* 8.6* 9.0  MG  --   --   --   --  2.2   GFR: Estimated Creatinine Clearance: 48.3 mL/min (by C-G formula based on SCr of 1.14 mg/dL). Liver Function Tests: Recent Labs  Lab 08/08/23 0519  AST 15  ALT 24  ALKPHOS 65  BILITOT 0.3  PROT 5.1*  ALBUMIN 2.7*   No results for input(s): "LIPASE", "AMYLASE" in the last 168 hours. No results for input(s): "AMMONIA" in the last 168 hours. Coagulation Profile: No results for input(s): "INR", "PROTIME" in the last 168 hours. Cardiac Enzymes: No results for input(s): "CKTOTAL", "CKMB", "CKMBINDEX", "TROPONINI" in the last 168 hours. BNP (last 3 results) No results for input(s): "PROBNP" in the last 8760 hours. HbA1C: No results for input(s): "HGBA1C" in the last 72 hours. CBG: Recent Labs  Lab 08/07/23 0713 08/07/23 1212 08/07/23 1621 08/07/23 2058 08/08/23 0801  GLUCAP 213* 146* 260* 212* 187*   Lipid Profile: Recent Labs    08/07/23 0404  CHOL 142  HDL 79  LDLCALC 47  TRIG 81  CHOLHDL 1.8   Thyroid Function Tests: Recent Labs    08/08/23 0519  TSH 0.211*   Anemia Panel: No results for input(s): "VITAMINB12", "FOLATE", "FERRITIN", "TIBC", "IRON", "RETICCTPCT" in the last 72 hours. Sepsis Labs: No results for input(s): "PROCALCITON", "LATICACIDVEN" in the last 168 hours.  Recent Results (from the past 240 hour(s))  Resp panel by RT-PCR (RSV, Flu A&B, Covid) Anterior Nasal Swab     Status: None   Collection Time: 08/04/23  1:14 PM   Specimen: Anterior Nasal Swab  Result Value Ref Range Status   SARS Coronavirus 2 by RT PCR NEGATIVE NEGATIVE Final   Influenza A by PCR NEGATIVE NEGATIVE Final   Influenza B by PCR NEGATIVE NEGATIVE Final    Comment: (NOTE) The Xpert Xpress SARS-CoV-2/FLU/RSV plus assay is intended as an aid in the diagnosis of influenza from Nasopharyngeal swab specimens and should not be used as a sole basis for treatment. Nasal washings  and aspirates are unacceptable for Xpert Xpress SARS-CoV-2/FLU/RSV testing.  Fact Sheet for Patients: BloggerCourse.com  Fact Sheet for Healthcare Providers: SeriousBroker.it  This test is not yet approved or cleared by the [place] FDA and has been authorized for detection and/or diagnosis of SARS-CoV-2 by FDA under an Emergency Use Authorization (EUA). This EUA will remain in effect (meaning this test can be used) for the duration of the COVID-19 declaration under Section 564(b)(1) of the Act, 21 U.S.C. section 360bbb-3(b)(1), unless the authorization is terminated or revoked.     Resp Syncytial Virus by PCR NEGATIVE NEGATIVE Final    Comment: (NOTE) Fact Sheet for Patients: BloggerCourse.com  Fact Sheet for Healthcare Providers:  SeriousBroker.it  This test is not yet approved or cleared by the [place] and has been authorized for detection and/or diagnosis of SARS-CoV-2 by FDA under an Emergency Use Authorization (EUA). This EUA will remain in effect (meaning this test can be used) for the duration of the COVID-19 declaration under Section 564(b)(1) of the Act, 21 U.S.C. section 360bbb-3(b)(1), unless the authorization is terminated or revoked.  Performed at [facility], [address], [place], [place] [address]          Radiology Studies: DG CHEST PORT 1 VIEW  Result Date: 08/07/2023 CLINICAL DATA:  Shortness of breath EXAM: PORTABLE CHEST 1 VIEW COMPARISON:  03/24/2023, CT 01/23/2023, 08/04/2023 FINDINGS: Post sternotomy changes and left atrial appendage clip. Left hilar opacity and distortion is unchanged. Right lung grossly clear. Small left-sided pleural effusion appears slightly increased. Left basilar airspace disease. Volume loss left thorax IMPRESSION: 1. Small left effusion appears slightly increased. Left basilar airspace disease may  reflect atelectasis or pneumonia. 2. Similar appearance of left hilar opacity and distortion presumably due to post therapy change. Electronically Signed   By: Jasmine Pang M.D.   On: 08/07/2023 15:15        Scheduled Meds:  arformoterol  15 mcg Nebulization BID   atorvastatin  80 mg Oral Daily   budesonide (PULMICORT) nebulizer solution  0.25 mg Nebulization BID   diltiazem  120 mg Oral Daily   furosemide  40 mg Intravenous Daily   insulin aspart  0-9 Units Subcutaneous TID WC   potassium chloride  10 mEq Oral Daily   predniSONE  40 mg Oral Q breakfast   rivaroxaban  20 mg Oral QPM   sodium chloride flush  3 mL Intravenous Q12H   spironolactone  25 mg Oral Daily   tamsulosin  0.4 mg Oral Daily   Continuous Infusions:  sodium chloride 5 mL/hr at 08/07/23 1906   amiodarone 30 mg/hr (08/08/23 0111)     LOS: 3 days    Time spent: 40 minutes    Joseph Art, DO [facility]

## 2023-08-09 ENCOUNTER — Other Ambulatory Visit (HOSPITAL_COMMUNITY): Payer: Self-pay

## 2023-08-09 DIAGNOSIS — I480 Paroxysmal atrial fibrillation: Secondary | ICD-10-CM

## 2023-08-09 DIAGNOSIS — J441 Chronic obstructive pulmonary disease with (acute) exacerbation: Secondary | ICD-10-CM | POA: Diagnosis not present

## 2023-08-09 LAB — CBC
HCT: 40.5 % (ref 39.0–52.0)
Hemoglobin: 12.7 g/dL — ABNORMAL LOW (ref 13.0–17.0)
MCH: 27 pg (ref 26.0–34.0)
MCHC: 31.4 g/dL (ref 30.0–36.0)
MCV: 86 fL (ref 80.0–100.0)
Platelets: 273 10*3/uL (ref 150–400)
RBC: 4.71 MIL/uL (ref 4.22–5.81)
RDW: 17.3 % — ABNORMAL HIGH (ref 11.5–15.5)
WBC: 15.3 10*3/uL — ABNORMAL HIGH (ref 4.0–10.5)
nRBC: 0 % (ref 0.0–0.2)

## 2023-08-09 LAB — BASIC METABOLIC PANEL
Anion gap: 13 (ref 5–15)
BUN: 38 mg/dL — ABNORMAL HIGH (ref 8–23)
CO2: 29 mmol/L (ref 22–32)
Calcium: 9 mg/dL (ref 8.9–10.3)
Chloride: 93 mmol/L — ABNORMAL LOW (ref 98–111)
Creatinine, Ser: 1.02 mg/dL (ref 0.61–1.24)
GFR, Estimated: >60 mL/min (ref >60)
Glucose, Bld: 188 mg/dL — ABNORMAL HIGH (ref 70–99)
Potassium: 4.3 mmol/L (ref 3.5–5.1)
Sodium: 135 mmol/L (ref 135–145)

## 2023-08-09 LAB — GLUCOSE, CAPILLARY: Glucose-Capillary: 131 mg/dL — ABNORMAL HIGH (ref 70–99)

## 2023-08-09 LAB — T4, FREE: Free T4: 1.11 ng/dL (ref 0.61–1.12)

## 2023-08-09 MED ORDER — AMIODARONE HCL 200 MG PO TABS
200.0000 mg | ORAL_TABLET | Freq: Every day | ORAL | 0 refills | Status: DC
Start: 2023-08-09 — End: 2023-09-09
  Filled 2023-08-09: qty 44, 37d supply, fill #0

## 2023-08-09 MED ORDER — PREDNISONE 20 MG PO TABS
40.0000 mg | ORAL_TABLET | Freq: Every day | ORAL | 0 refills | Status: AC
  Filled 2023-08-09: qty 6, 3d supply, fill #0

## 2023-08-09 MED ORDER — BREZTRI AEROSPHERE 160-9-4.8 MCG/ACT IN AERO
2.0000 | INHALATION_SPRAY | Freq: Two times a day (BID) | RESPIRATORY_TRACT | Status: DC
Start: 2023-08-09 — End: 2023-09-09

## 2023-08-09 MED ORDER — LEVALBUTEROL TARTRATE 45 MCG/ACT IN AERO
2.0000 | INHALATION_SPRAY | RESPIRATORY_TRACT | 1 refills | Status: DC | PRN
  Filled 2023-08-09: qty 15, 30d supply, fill #0

## 2023-08-09 MED ORDER — RIVAROXABAN 20 MG PO TABS
20.0000 mg | ORAL_TABLET | Freq: Every evening | ORAL | 1 refills | Status: DC
  Filled 2023-08-09: qty 30, 30d supply, fill #0

## 2023-08-09 NOTE — Plan of Care (Signed)

## 2023-08-09 NOTE — Discharge Summary (Addendum)
Physician Discharge Summary  [name] NWG:[id number] DOB: March 02, 1941 DOA: 08/04/2023  PCP: Sandford Craze, NP  Admit date: 08/04/2023 Discharge date: 08/09/2023  Admitted From: home Discharge disposition: home   Recommendations for Outpatient Follow-Up:   Close outpatient cardiology follow up Was on cardizem 120mg  in hospital not 240mg  Outpatient palliative care   Discharge Diagnosis:   Principal Problem:   COPD exacerbation (HCC) Active Problems:   Atrial fibrillation with rapid ventricular response (HCC)   Chronic heart failure with preserved ejection fraction (HFpEF) (HCC)   Primary hypertension   Diabetes type 2, controlled (HCC)   Coronary artery disease   Pure hypercholesterolemia   Atrial fibrillation with RVR (HCC)    Discharge Condition: Improved.  Diet recommendation: Low sodium, heart healthy.  Carbohydrate-modified.  Wound care: None.  Code status: DNR   History of Present Illness:   Robert Bates is a 82 y.o. 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   Patient was seen in pulmonology clinic 9/20 for acute visit due to shortness of breath and cough.  He was started on treatment for COPD flareup.  He was given albuterol and Depo-Medrol 80 mg in the clinic and sent home on a prednisone taper.  He had already been on Augmentin for treatment of diverticulitis which was continued.   He was seen in PCP clinic 9/24 for follow-up.  He was noted to be tachycardic.  EKG confirmed A-fib with heart rate 127.  He was started on diltiazem 120 mg daily and recommended to continue his home amiodarone and Xarelto.   Seen again in PCP for follow-up 9/27 and felt to be volume overloaded.  Home  Lasix was increased to 40 mg a.m. and 20 mg p.m. x 3 days with plan to return to 40 mg daily afterwards.   He had follow-up with cardiology today 9/30.  He presented with A-fib RVR, shortness of breath with active wheezing.  Heart rate was 178.  He was sent to the ED for further evaluation.   Patient states that breathing seems to be improved after receiving nebulizer treatment and IV steroids while in the ED.  Reports lower extremity edema has been ongoing for the last 1-1/2 weeks.  Not really improved despite increase in Lasix over the last few days.  He denies any cough.  He reports adherence to Xarelto denies obvious bleeding.  He denies chest pain.   Hospital Course by Problem:   COPD with acute exacerbation Aggressive bronchodilator therapy, IV steroids-- transition to PO, inhaled steroids, scheduled and as needed bronchodilators, deep breathing exercises, incentive spirometry, chest physiotherapy. Supplemental oxygen to keep saturations more than 90%. -home O2 eval- normal   Paroxysmal A-fib with RVR: Heart rate fluctuates.   - on Xarelto.   -HR improved on amio gtt-- plan to continue for 24 more hours along with PO cardizem-- BP continues to limit -cardiology following   Acute on Chronic heart failure: Known ejection fraction 55 to 60%.  Present with increased lower extremity swelling and stable left pleural effusion.  Not hypoxemic. - Lasix, spironolactone per cards   CAD status post CABG: Stable. 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  Follows with oncology Dr. Arbutus Ped   DNR with limited intervention - Palliative care as outpatient     Medical Consultants:      Discharge Exam:   Vitals:   08/09/23 0738 08/09/23 0759  BP:  137/83  Pulse:    Resp:  17  Temp:  97.8 F (36.6 C)  SpO2: 97% 98%   Vitals:   08/08/23 2317 08/09/23 0408 08/09/23 0738 08/09/23 0759  BP:  112/63 116/64  137/83  Pulse: 64 (!) 54    Resp: 16 20  17   Temp: (!) 97.5 F (36.4 C) 97.6 F (36.4 C)  97.8 F (36.6 C)  TempSrc: Oral Oral  Oral  SpO2: 95% 96% 97% 98%  Weight:  76.1 kg    Height:        General exam: Appears calm and comfortable.    The results of significant diagnostics from this hospitalization (including imaging, microbiology, ancillary and laboratory) are listed below for reference.     Procedures and Diagnostic Studies:   DG Chest Port 1 View  Result Date: 08/04/2023 CLINICAL DATA:  Shortness of breath.  Chest pain. EXAM: PORTABLE CHEST 1 VIEW COMPARISON:  Chest radiograph dated 07/25/2023. FINDINGS: Similar appearance of scarring at the left lung base and left perihilar region small left pleural effusion, similar or slightly decreased. No pneumothorax. Stable cardiac silhouette. Median sternotomy wires and CABG vascular clips. Left atrial appendage occlusion device. No acute osseous pathology. IMPRESSION: Small left pleural effusion, similar or slightly decreased. Electronically Signed   By: Elgie Collard M.D.   On: 08/04/2023 16:35     Labs:   Basic Metabolic Panel: Recent Labs  Lab 08/04/23 1328 08/04/23 1344 08/05/23 0231 08/07/23 0404 08/08/23 0519 08/09/23 0532  NA 137 135 135 135 137 135  K 3.9 3.8 4.3 4.8 4.8 4.3  CL 102  --  99 97* 92* 93*  CO2 23  --  23 27 29 29   GLUCOSE 127*  --  279* 222* 177* 188*  BUN 27*  --  27* 29* 37* 38*  CREATININE 0.95  --  1.09 1.16 1.14 1.02  CALCIUM 8.9  --  8.5* 8.6* 9.0 9.0  MG  --   --   --   --  2.2  --    GFR Estimated Creatinine Clearance: 54 mL/min (by C-G formula based on SCr of 1.02 mg/dL). Liver Function Tests: Recent Labs  Lab 08/08/23 0519  AST 15  ALT 24  ALKPHOS 65  BILITOT 0.3  PROT 5.1*  ALBUMIN 2.7*   No results for input(s): "LIPASE", "AMYLASE" in the last 168 hours. No results for input(s): "AMMONIA" in the last 168 hours. Coagulation profile No results for  input(s): "INR", "PROTIME" in the last 168 hours.  CBC: Recent Labs  Lab 08/04/23 1328 08/04/23 1344 08/05/23 0231 08/07/23 0404 08/08/23 0519 08/09/23 0532  WBC 13.4*  --  14.6* 13.2* 16.8* 15.3*  HGB 12.6* 13.6 12.2* 12.2* 11.7* 12.7*  HCT 41.1 40.0 39.2 39.3 37.7* 40.5  MCV 88.4  --  89.1 88.3 86.7 86.0  PLT 291  --  288 283 277 273   Cardiac Enzymes: No results for input(s): "CKTOTAL", "CKMB", "CKMBINDEX", "TROPONINI" in the last 168 hours. BNP: Invalid input(s): "POCBNP" CBG: Recent Labs  Lab 08/08/23 0801 08/08/23 1212 08/08/23 1606 08/08/23 2133 08/09/23 0745  GLUCAP 187* 235* 268* 144* 131*   D-Dimer No results for input(s): "DDIMER" in the last 72 hours. Hgb A1c No results for input(s): "HGBA1C" in  the last 72 hours. Lipid Profile Recent Labs    08/07/23 0404  CHOL 142  HDL 79  LDLCALC 47  TRIG 81  CHOLHDL 1.8   Thyroid function studies Recent Labs    08/08/23 0519  TSH 0.211*   Anemia work up No results for input(s): "VITAMINB12", "FOLATE", "FERRITIN", "TIBC", "IRON", "RETICCTPCT" in the last 72 hours. Microbiology Recent Results (from the past 240 hour(s))  Resp panel by RT-PCR (RSV, Flu A&B, Covid) Anterior Nasal Swab     Status: None   Collection Time: 08/04/23  1:14 PM   Specimen: Anterior Nasal Swab  Result Value Ref Range Status   SARS Coronavirus 2 by RT PCR NEGATIVE NEGATIVE Final   Influenza A by PCR NEGATIVE NEGATIVE Final   Influenza B by PCR NEGATIVE NEGATIVE Final    Comment: (NOTE) The Xpert Xpress SARS-CoV-2/FLU/RSV plus assay is intended as an aid in the diagnosis of influenza from Nasopharyngeal swab specimens and should not be used as a sole basis for treatment. Nasal washings and aspirates are unacceptable for Xpert Xpress SARS-CoV-2/FLU/RSV testing.  Fact Sheet for Patients: BloggerCourse.com  Fact Sheet for Healthcare Providers: SeriousBroker.it  This test is not  yet approved or cleared by the [place] FDA and has been authorized for detection and/or diagnosis of SARS-CoV-2 by FDA under an Emergency Use Authorization (EUA). This EUA will remain in effect (meaning this test can be used) for the duration of the COVID-19 declaration under Section 564(b)(1) of the Act, 21 U.S.C. section 360bbb-3(b)(1), unless the authorization is terminated or revoked.     Resp Syncytial Virus by PCR NEGATIVE NEGATIVE Final    Comment: (NOTE) Fact Sheet for Patients: BloggerCourse.com  Fact Sheet for Healthcare Providers: SeriousBroker.it  This test is not yet approved or cleared by the [place] FDA and has been authorized for detection and/or diagnosis of SARS-CoV-2 by FDA under an Emergency Use Authorization (EUA). This EUA will remain in effect (meaning this test can be used) for the duration of the COVID-19 declaration under Section 564(b)(1) of the Act, 21 U.S.C. section 360bbb-3(b)(1), unless the authorization is terminated or revoked.  Performed at [facility], [address] [place], [place] [address]      Discharge Instructions:   Discharge Instructions     Diet - low sodium heart healthy   Complete by: As directed    Diet Carb Modified   Complete by: As directed    Increase activity slowly   Complete by: As directed       Allergies as of 08/09/2023       Reactions   Iodine Swelling   Neck, gland swelling   Iohexol Swelling   Neck, gland swelling   Metformin And Related Diarrhea        Medication List     STOP taking these medications    albuterol (2.5 MG/3ML) 0.083% nebulizer solution Commonly known as: PROVENTIL   amoxicillin-clavulanate 875-125 MG tablet Commonly known as: AUGMENTIN       TAKE these medications    acetaminophen 500 MG tablet Commonly known as: TYLENOL Take 1-2 tablets (500-1,000 mg total) by mouth every 6 (six) hours as  needed. What changed:  how much to take when to take this reasons to take this   amiodarone 200 MG tablet Commonly known as: PACERONE Amiodarone 200mg  BID x 7 days then 200mg  daily after What changed: additional instructions   ASCORBIC ACID PO Take 1 tablet by mouth daily. Vitamin C, unknown strength.  atorvastatin 80 MG tablet Commonly known as: LIPITOR Take 1 tablet (80 mg total) by mouth daily.   benzonatate 100 MG capsule Commonly known as: TESSALON Take 1 capsule (100 mg total) by mouth 3 (three) times daily as needed.   Breztri Aerosphere 160-9-4.8 MCG/ACT Aero Generic drug: Budeson-Glycopyrrol-Formoterol Inhale 2 puffs into the lungs in the morning and at bedtime. What changed:  when to take this reasons to take this   diltiazem 120 MG 24 hr tablet Commonly known as: CARDIZEM LA Take 1 tablet (120 mg total) by mouth daily.   fluticasone 50 MCG/ACT nasal spray Commonly known as: FLONASE Place 2 sprays into both nostrils daily. What changed:  when to take this reasons to take this   furosemide 40 MG tablet Commonly known as: LASIX Take 1 tablet (40 mg total) by mouth daily.   glipiZIDE 2.5 MG 24 hr tablet Commonly known as: GLUCOTROL XL Take 1 tablet (2.5 mg total) by mouth daily with breakfast.   levalbuterol 45 MCG/ACT inhaler Commonly known as: XOPENEX HFA Inhale 2 puffs into the lungs every 4 (four) hours as needed for wheezing.   omeprazole 40 MG capsule Commonly known as: PRILOSEC Take 1 capsule (40 mg total) by mouth daily.   potassium chloride 10 MEQ tablet Commonly known as: KLOR-CON Take 1 tablet (10 mEq total) by mouth daily.   predniSONE 20 MG tablet Commonly known as: DELTASONE Take 2 tablets (40 mg total) by mouth daily with breakfast for 3 days.   pregabalin 25 MG capsule Commonly known as: Lyrica Take 1 capsule (25 mg total) by mouth 2 (two) times daily.   spironolactone 25 MG tablet Commonly known as: ALDACTONE Take 1 tablet  (25 mg total) by mouth daily.   tamsulosin 0.4 MG Caps capsule Commonly known as: FLOMAX Take 1 capsule (0.4 mg total) by mouth daily.   Xarelto 20 MG Tabs tablet Generic drug: rivaroxaban Take 1 tablet (20 mg total) by mouth every evening with supper.        Follow-up Information     Sandford Craze, NP Follow up.   Specialty: Internal Medicine Contact information: [address] [place] [address] [phone]                  Time coordinating discharge: 45 min  Signed:  Joseph Art DO  [facility] 08/09/2023, 9:36 AM

## 2023-08-09 NOTE — Progress Notes (Signed)
Progress Note  Patient Name: Robert Bates Date of Encounter: 08/09/2023  [facility] Cardiologist: Kristeen Miss, MD   Subjective   In sinus still wants to go home Breathing at baseline   Inpatient Medications    Scheduled Meds:  arformoterol  15 mcg Nebulization BID   atorvastatin  80 mg Oral Daily   budesonide (PULMICORT) nebulizer solution  0.25 mg Nebulization BID   diltiazem  120 mg Oral Daily   furosemide  40 mg Intravenous Daily   insulin aspart  0-9 Units Subcutaneous TID WC   potassium chloride  10 mEq Oral Daily   predniSONE  40 mg Oral Q breakfast   rivaroxaban  20 mg Oral QPM   sodium chloride flush  3 mL Intravenous Q12H   spironolactone  25 mg Oral Daily   tamsulosin  0.4 mg Oral Daily   Continuous Infusions:  sodium chloride 5 mL/hr at 08/07/23 1906   amiodarone 30 mg/hr (08/09/23 0354)   PRN Meds: sodium chloride, acetaminophen **OR** acetaminophen, levalbuterol, ondansetron **OR** ondansetron (ZOFRAN) IV, senna-docusate   Vital Signs    Vitals:   08/08/23 2317 08/09/23 0408 08/09/23 0738 08/09/23 0759  BP: 112/63 116/64  137/83  Pulse: 64 (!) 54    Resp: 16 20  17   Temp: (!) 97.5 F (36.4 C) 97.6 F (36.4 C)  97.8 F (36.6 C)  TempSrc: Oral Oral  Oral  SpO2: 95% 96% 97% 98%  Weight:  76.1 kg    Height:        Intake/Output Summary (Last 24 hours) at 08/09/2023 0842 Last data filed at 08/09/2023 0500 Gross per 24 hour  Intake 1424.71 ml  Output 2150 ml  Net -725.29 ml      08/09/2023    4:08 AM 08/08/2023    3:53 AM 08/06/2023    3:35 AM  Last 3 Weights  Weight (lbs) 167 lb 11.2 oz 169 lb 171 lb 1.6 oz  Weight (kg) 76.068 kg 76.658 kg 77.61 kg      Telemetry  Sinus bradycardia in the 50s personally Reviewed  ECG    No new EKG- Personally Reviewed  Physical Exam   Elderly male No active wheezing Distant heart sounds Abdomen benign  Trace edema  Labs    High Sensitivity Troponin:  No results for input(s):  "TROPONINIHS" in the last 720 hours.    Chemistry Recent Labs  Lab 08/07/23 0404 08/08/23 0519 08/09/23 0532  NA 135 137 135  K 4.8 4.8 4.3  CL 97* 92* 93*  CO2 27 29 29   GLUCOSE 222* 177* 188*  BUN 29* 37* 38*  CREATININE 1.16 1.14 1.02  CALCIUM 8.6* 9.0 9.0  PROT  --  5.1*  --   ALBUMIN  --  2.7*  --   AST  --  15  --   ALT  --  24  --   ALKPHOS  --  65  --   BILITOT  --  0.3  --   GFRNONAA >60 >60 >60  ANIONGAP 11 16* 13     Hematology Recent Labs  Lab 08/07/23 0404 08/08/23 0519 08/09/23 0532  WBC 13.2* 16.8* 15.3*  RBC 4.45 4.35 4.71  HGB 12.2* 11.7* 12.7*  HCT 39.3 37.7* 40.5  MCV 88.3 86.7 86.0  MCH 27.4 26.9 27.0  MCHC 31.0 31.0 31.4  RDW 17.4* 17.2* 17.3*  PLT 283 277 273    BNP Recent Labs  Lab 08/04/23 1328  BNP 134.1*  DDimer No results for input(s): "DDIMER" in the last 168 hours.   CHA2DS2-VASc Score = 6   This indicates a 9.7% annual risk of stroke. The patient's score is based upon: CHF History: 1 HTN History: 1 Diabetes History: 1 Stroke History: 0 Vascular Disease History: 1 Age Score: 2 Gender Score: 0   Radiology    DG CHEST PORT 1 VIEW  Result Date: 08/07/2023 CLINICAL DATA:  Shortness of breath EXAM: PORTABLE CHEST 1 VIEW COMPARISON:  03/24/2023, CT 01/23/2023, 08/04/2023 FINDINGS: Post sternotomy changes and left atrial appendage clip. Left hilar opacity and distortion is unchanged. Right lung grossly clear. Small left-sided pleural effusion appears slightly increased. Left basilar airspace disease. Volume loss left thorax IMPRESSION: 1. Small left effusion appears slightly increased. Left basilar airspace disease may reflect atelectasis or pneumonia. 2. Similar appearance of left hilar opacity and distortion presumably due to post therapy change. Electronically Signed   By: Jasmine Pang M.D.   On: 08/07/2023 15:15    Patient Profile     82 y.o. male Robert Bates is a 82 y.o. 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  Assessment & Plan    COPD with acute exacerbation: -exam improved no wheezing  -Currently on Brovana/Pulmicort BID, Xopenex prn>> avoid albuterol given atrial fibrillation -Currently on oral steroids -O2 sats 99% on room air   Paroxysmal A-fib/flutter with RVR: Found to be in AF RVR with rate 172 in cardiology clinic.   -d/c iv amiodarone - d/c 200 mg bid for a week then 200 mg daily  -Continue Xarelto 20 mg daily -Continue Cardizem CD 240 mg daily   Acute on Chronic HFpEF: Last EF 55-60%.  Has some increased swelling to lower extremities and small stable left pleural effusion on CXR.  Not hypoxic. 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  Follows with oncology Dr. Arbutus Ped.  Hyperlipidemia -LDL goal < 70 -LDL 47 and HDL 79 this admission -Continue atorvastatin 80 mg daily  Hypertension -BP controlled at 143/84 mmHg -Currently on Cardizem CD 120 mg daily for heart rate control but cannot titrate further.    Total time spent with patient today 35 minutes. This includes reviewing records, evaluating the patient and coordinating care. Face-to-face time >50%.  For questions or updates, please contact [facility] Please consult [email or web address] for contact info under        Signed, Charlton Haws, MD  08/09/2023, 8:42 AM

## 2023-08-10 LAB — T3, FREE: T3, Free: 1.8 pg/mL — ABNORMAL LOW (ref 2.0–4.4)

## 2023-08-11 ENCOUNTER — Telehealth: Payer: Self-pay | Admitting: Pharmacist

## 2023-08-11 ENCOUNTER — Other Ambulatory Visit (HOSPITAL_COMMUNITY): Payer: Self-pay

## 2023-08-11 ENCOUNTER — Telehealth: Payer: Medicare Other

## 2023-08-11 ENCOUNTER — Telehealth: Payer: Self-pay | Admitting: Family

## 2023-08-11 NOTE — Telephone Encounter (Signed)
Drenda Freeze with [facility] called to get verbal orders to start palliative program. Please call back [phone]

## 2023-08-11 NOTE — Telephone Encounter (Signed)
Patient called to say he missed Tammy's call. Advised she has another appt so I will send a message back for her to return the call.

## 2023-08-11 NOTE — Telephone Encounter (Signed)
Unsuccessful patient outreach to follow up medication management and xarelto patient assistance program application.  Unable to reach patient - LM on VM with CB# [phone] or [phone]

## 2023-08-12 NOTE — Telephone Encounter (Signed)
Yes please

## 2023-08-12 NOTE — Telephone Encounter (Signed)
Verbal orders given to Drenda Freeze to initiate palliative care for patient.

## 2023-08-13 ENCOUNTER — Other Ambulatory Visit: Payer: Self-pay | Admitting: Pharmacist

## 2023-08-13 ENCOUNTER — Other Ambulatory Visit (HOSPITAL_BASED_OUTPATIENT_CLINIC_OR_DEPARTMENT_OTHER): Payer: Self-pay

## 2023-08-13 ENCOUNTER — Encounter: Payer: Self-pay | Admitting: Pharmacist

## 2023-08-13 ENCOUNTER — Other Ambulatory Visit (HOSPITAL_COMMUNITY): Payer: Self-pay

## 2023-08-13 ENCOUNTER — Ambulatory Visit: Payer: Self-pay

## 2023-08-13 MED ORDER — LEVALBUTEROL TARTRATE 45 MCG/ACT IN AERO
2.0000 | INHALATION_SPRAY | RESPIRATORY_TRACT | 0 refills | Status: DC | PRN
  Filled 2023-08-13: qty 15, 30d supply, fill #0

## 2023-08-13 NOTE — Telephone Encounter (Signed)
See documentation note about Xarelto and hospital discharge.

## 2023-08-13 NOTE — Telephone Encounter (Signed)
Able to reach patient today - see documentation note.

## 2023-08-13 NOTE — Progress Notes (Signed)
08/13/2023 Name: Robert Bates MRN: [id number] DOB: 01/30/1941  Chief Complaint  Patient presents with   Medication Management   Congestive Heart Failure    Robert Bates is a 82 y.o. year old male who presented for an office visit today.   They were referred to the pharmacist by their PCP for assistance in managing medication access.    Subjective:  09/30 to 08/09/2023 - Hospitalization for afib and COPD exacerbation. Patient was been in cardiology office on 9/30. Presented with A-fib RVR, shortness of breath with active wheezing.  Heart rate was 178. Sent to the ED.  Medications stopped at discharge:  Albuterol 0.083% neb solution  Amoxicillin-clavulanate 875-125 mg  Medications changed at Discharge:  amiodarone 200 MG tablet (PACERONE) - Amiodarone 200mg  twice a day x 7 days then 200mg  daily after  Medications started at Discharge:  levalbuterol 45 MCG/ACT inhaler (XOPENEX HFA) - Inhale 2 puffs into the lungs every 4 (four) hours as needed for wheezing.  predniSONE 20 MG tablet - Take 2 tablets (40 mg total) by mouth daily with breakfast for 3 days.  spironolactone 25 MG tablet - Take 1 tablet (25 mg total) by mouth daily.   Afib / CHF:  Current medications: amiodarone 200mg  daily for rate control  Xarelto 20mg  daily for anticoagulation / stroke prevention.   Patient's adherence with Xarelto has been spotty over the last 2 or 3 months due to being in the Medicare Coverage gap.  I checked on medication assistance program application / appeal Monday 08/11/2023. Was told by [employer] Dib[employer] medication assistance program representative that they did receive additional letter regarding Robert Bates finances. The letter has not been reviewed by appeal department yet.  He does still have samples of Xarelto at our front desk to pick up. Patient states he was provided with a prescription for Xarleto #30 days at discharge from recent hospitalization.   We applied earlier in 2024 for LIS but  patient was denied LIS due to his income being about $240 over yearly income limit.   Edema / shortness of breath:  Patient states he is still experiencing significant LEE. He has appointment to see AP in our office tomorrow.  Robert Bates did not get levalbuterol inhaler when he was discharged from the hospital. Checked with [facility] clinic and since he was discharged on a Saturday and they did not have in stock they were not able to provide to the patient. Pharmacist in [facility] clinic did run a test claim and cost of levalbuterol inhaler would be $15.62  Medication Access/Adherence  Patient reports affordability concerns with their medications: Yes  Patient reports access/transportation concerns to their pharmacy: No  Patient reports adherence concerns with their medications:  No      Objective:    Lab Results  Component Value Date   HGBA1C 6.7 (H) 04/03/2023    Lab Results  Component Value Date   CREATININE 1.02 08/09/2023   BUN 38 (H) 08/09/2023   NA 135 08/09/2023   K 4.3 08/09/2023   CL 93 (L) 08/09/2023   CO2 29 08/09/2023    Lab Results  Component Value Date   CHOL 142 08/07/2023   HDL 79 08/07/2023   LDLCALC 47 08/07/2023   LDLDIRECT 142.2 12/26/2008   TRIG 81 08/07/2023   CHOLHDL 1.8 08/07/2023    Medications Reviewed Today     Reviewed by [name], RPH-CPP (Pharmacist) on 08/13/23 at 1408  Med List Status: <None>   Medication Order Taking? Sig Documenting Provider Last  Dose Status Informant  acetaminophen (TYLENOL) 500 MG tablet [id number]  Take 1-2 tablets (500-1,000 mg total) by mouth every 6 (six) hours as needed.  Patient taking differently: Take 1,000 mg by mouth daily as needed for headache, fever or moderate pain.   Ardelle Balls, PA-C  Active Self, Pharmacy Records           Med Note [place], [place] B   Wed Jul 16, 2023 11:25 AM)    amiodarone (PACERONE) 200 MG tablet [id number] Yes Take 1 tablet (200 mg total) by mouth 2 (two) times daily for 7  days, then take 1 tablet (200 mg total) by mouth daily. Joseph Art, DO Taking Active   ASCORBIC ACID PO [id number]  Take 1 tablet by mouth daily. Vitamin C, unknown strength. [provider]  Active Self, Pharmacy Records           Med Note [facility] [place] So   Tue Aug 05, 2023  1:58 AM) Taken occasionally  atorvastatin (LIPITOR) 80 MG tablet [id number] Yes Take 1 tablet (80 mg total) by mouth daily. Nahser, Deloris Ping, MD Taking Active Self, Pharmacy Records  benzonatate [facility]) 100 MG capsule [id number]  Take 1 capsule (100 mg total) by mouth 3 (three) times daily as needed. Sandford Craze, NP  Active Self, Pharmacy Records  Budeson-Glycopyrrol-Formoterol [facility] AEROSPHERE) 160-9-4.8 MCG/ACT Sandrea Matte [id number] Yes Inhale 2 puffs into the lungs in the morning and at bedtime. Joseph Art, DO Taking Active   diltiazem (CARDIZEM LA) 120 MG 24 hr tablet [id number] Yes Take 1 tablet (120 mg total) by mouth daily. Sandford Craze, NP Taking Active Self, Pharmacy Records  fluticasone [facility]) 50 MCG/ACT nasal spray [id number] Yes Place 2 sprays into both nostrils daily.  Patient taking differently: Place 2 sprays into both nostrils daily as needed for allergies.   Saguier, Kateri Mc Taking Active Self, Pharmacy Records           Med Note Tobias Alexander Aug 04, 2023  4:58 PM)    furosemide (LASIX) 40 MG tablet [id number] Yes Take 1 tablet (40 mg total) by mouth daily. Sandford Craze, NP Taking Active Self, Pharmacy Records  glipiZIDE (GLUCOTROL XL) 2.5 MG 24 hr tablet [id number] Yes Take 1 tablet (2.5 mg total) by mouth daily with breakfast. Sandford Craze, NP Taking Active Self, Pharmacy Records  levalbuterol [facility]) 45 MCG/ACT inhaler [id number] No Inhale 2 puffs into the lungs every 4 (four) hours as needed for wheezing.  Patient not taking: Reported on 08/13/2023   Joseph Art, DO Not Taking Active   omeprazole (PRILOSEC) 40 MG capsule [id number] Yes  Take 1 capsule (40 mg total) by mouth daily. Sandford Craze, NP Taking Active Self, Pharmacy Records  potassium chloride (KLOR-CON) 10 MEQ tablet [id number] Yes Take 1 tablet (10 mEq total) by mouth daily. Tereso Newcomer T, PA-C Taking Active Self, Pharmacy Records  pregabalin (LYRICA) 25 MG capsule [id number]  Take 1 capsule (25 mg total) by mouth 2 (two) times daily. Sandford Craze, NP  Active Self, Pharmacy Records  rivaroxaban (XARELTO) 20 MG TABS tablet [id number] Yes Take 1 tablet (20 mg total) by mouth every evening with supper. Joseph Art, DO Taking Active   spironolactone (ALDACTONE) 25 MG tablet [id number] Yes Take 1 tablet (25 mg total) by mouth daily. Sandford Craze, NP Taking Active Self, Pharmacy Records  tamsulosin [facility]) 0.4 MG CAPS capsule [id number] Yes Take 1 capsule (0.4 mg total) by mouth daily. Sandford Craze, NP Taking  Active Self, Pharmacy Records              Assessment/Plan:  Discussed with patient that we are awaiting decision from [facility] appeals department regarding his application for Xarelto medication assistance program. Reminded patient to pick up samples we left at front desk for him.    If patient is not approved for Xarelto medication assistance program with [employer]ibbles / Laural Benes and [employer], another option which patient used in 2023 is the [employer] (now called Xarelto with Me Coverage gap program). Cost to patient would be $89/30 days or $260 for 90 days but patient has expressed that this is still high cost for him   Patient provided with levalbuterol information on price ($15.62). Checking with pharmacy at [facility] to see if they have in stock so patient can get if he decides he needs it. Discussed difference between albuterol and levalbuterol - levalbutrol is less likely to increase heart rate.    Reviewed discharge medications and directions with patient. He has all other medications prescribed at discharge.     [name], PharmD Clinical Pharmacist [facility]

## 2023-08-13 NOTE — Patient Outreach (Signed)
Care Coordination   Multidisciplinary Case Review Note    08/13/2023 Name: Robert Bates MRN: [id number] DOB: [birth year]  Robert Bates is a 82 y.o. year old male who sees Sandford Craze, NP for primary care.  The  multidisciplinary care team met today to review patient care needs and barriers.     Goals Addressed             This Visit's Progress    contnue to improve post hospitalization       Interventions Today    Flowsheet Row Most Recent Value  General Interventions   General Interventions Discussed/Reviewed General Interventions Reviewed  [Multidisciplinary Case Discussion completed]            SDOH assessments and interventions completed:  No  Care Coordination Interventions Activated:  Yes   Care Coordination Interventions:  Yes, provided   Follow up plan:  TOC nurse to reach out to patient for [facility] call;  RNCM will continue to follow. Clinical pharmacist active(medication management and Xarelto patient assistance program application).    Multidisciplinary Team Attendees:   Ricke Hey, RNCM/TOC; Kathyrn Sheriff, [facility];   Scribe for Multidisciplinary Case Review:  Kathyrn Sheriff, [facility]  Kathyrn Sheriff, RN, MSN, BSN, CCM Care Management Coordinator [phone]

## 2023-08-14 ENCOUNTER — Telehealth: Payer: Self-pay | Admitting: *Deleted

## 2023-08-14 ENCOUNTER — Ambulatory Visit (HOSPITAL_BASED_OUTPATIENT_CLINIC_OR_DEPARTMENT_OTHER)
Admission: RE | Admit: 2023-08-14 | Discharge: 2023-08-14 | Disposition: A | Discharge status: Home / Self Care | Payer: Medicare Other | Source: Ambulatory Visit | Attending: Family | Admitting: Family

## 2023-08-14 ENCOUNTER — Encounter: Payer: Self-pay | Admitting: Family

## 2023-08-14 ENCOUNTER — Ambulatory Visit (INDEPENDENT_AMBULATORY_CARE_PROVIDER_SITE_OTHER): Payer: Medicare Other | Admitting: Family

## 2023-08-14 VITALS — BP 138/82 | HR 70 | Ht 68.0 in | Wt 174.0 lb

## 2023-08-14 DIAGNOSIS — R0609 Other forms of dyspnea: Secondary | ICD-10-CM | POA: Diagnosis not present

## 2023-08-14 DIAGNOSIS — J441 Chronic obstructive pulmonary disease with (acute) exacerbation: Secondary | ICD-10-CM

## 2023-08-14 DIAGNOSIS — R062 Wheezing: Secondary | ICD-10-CM | POA: Diagnosis not present

## 2023-08-14 DIAGNOSIS — I4819 Other persistent atrial fibrillation: Secondary | ICD-10-CM

## 2023-08-14 DIAGNOSIS — I5033 Acute on chronic diastolic (congestive) heart failure: Secondary | ICD-10-CM | POA: Diagnosis not present

## 2023-08-14 LAB — CBC WITH DIFFERENTIAL/PLATELET
Basophils Absolute: 0 10*3/uL (ref 0.0–0.1)
Basophils Relative: 0.2 % (ref 0.0–3.0)
Eosinophils Absolute: 0 10*3/uL (ref 0.0–0.7)
Eosinophils Relative: 0.2 % (ref 0.0–5.0)
HCT: 42.6 % (ref 39.0–52.0)
Hemoglobin: 13.7 g/dL (ref 13.0–17.0)
Lymphocytes Relative: 7.5 % — ABNORMAL LOW (ref 12.0–46.0)
Lymphs Abs: 1.1 10*3/uL (ref 0.7–4.0)
MCHC: 32.1 g/dL (ref 30.0–36.0)
MCV: 86.2 fL (ref 78.0–100.0)
Monocytes Absolute: 1.4 10*3/uL — ABNORMAL HIGH (ref 0.1–1.0)
Monocytes Relative: 10.1 % (ref 3.0–12.0)
Neutro Abs: 11.5 10*3/uL — ABNORMAL HIGH (ref 1.4–7.7)
Neutrophils Relative %: 82 % — ABNORMAL HIGH (ref 43.0–77.0)
Platelets: 230 10*3/uL (ref 150.0–400.0)
RBC: 4.94 Mil/uL (ref 4.22–5.81)
RDW: 18.3 % — ABNORMAL HIGH (ref 11.5–15.5)
WBC: 14 10*3/uL — ABNORMAL HIGH (ref 4.0–10.5)

## 2023-08-14 LAB — COMPREHENSIVE METABOLIC PANEL
ALT: 27 U/L (ref 0–53)
AST: 15 U/L (ref 0–37)
Albumin: 3.5 g/dL (ref 3.5–5.2)
Alkaline Phosphatase: 88 U/L (ref 39–117)
BUN: 29 mg/dL — ABNORMAL HIGH (ref 6–23)
CO2: 33 meq/L — ABNORMAL HIGH (ref 19–32)
Calcium: 9.3 mg/dL (ref 8.4–10.5)
Chloride: 96 meq/L (ref 96–112)
Creatinine, Ser: 1.05 mg/dL (ref 0.40–1.50)
GFR: 66.27 mL/min (ref >60.00)
Glucose, Bld: 104 mg/dL — ABNORMAL HIGH (ref 70–99)
Potassium: 4.6 meq/L (ref 3.5–5.1)
Sodium: 136 meq/L (ref 135–145)
Total Bilirubin: 0.6 mg/dL (ref 0.2–1.2)
Total Protein: 5.4 g/dL — ABNORMAL LOW (ref 6.0–8.3)

## 2023-08-14 LAB — BRAIN NATRIURETIC PEPTIDE: Pro B Natriuretic peptide (BNP): 83 pg/mL (ref 0.0–100.0)

## 2023-08-14 NOTE — Progress Notes (Signed)
Robert Bates is a 82 y.o. male with the following history as recorded in EpicCare:  Patient Active Problem List   Diagnosis Date Noted   Pure hypercholesterolemia 08/05/2023   Atrial fibrillation with RVR (HCC) 08/05/2023   Chronic atrial fibrillation with RVR (HCC) 07/29/2023   Cramping of hands 07/29/2023   Abdominal pain, acute, left lower quadrant 07/22/2023   Acute on chronic diastolic heart failure (HCC) 05/09/2023   Exertional shortness of breath 04/23/2023   SOB (shortness of breath) 04/23/2023   CHF (congestive heart failure) (HCC) 04/22/2023   Balanitis 04/07/2023   Phimosis 03/24/2023   Chest pain 03/24/2023   Grief reaction 01/17/2023   Bilateral impacted cerumen 11/05/2022   Urinary retention 11/05/2022   Tinnitus of both ears 08/16/2022   Musculoskeletal pain 07/17/2022   Pleural effusion 05/20/2022   Aortic dilatation (HCC) 05/13/2022   Dyspnea on exertion 05/06/2022   Thoracic aortic aneurysm (HCC) 04/04/2022   Lower urinary tract symptoms (LUTS) 03/15/2022   Thoracic radiculopathy 03/15/2022   Chronic heart failure with preserved ejection fraction (HFpEF) (HCC) 03/15/2022   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/15/2022   Unilateral inguinal hernia without obstruction or gangrene 01/15/2022   COPD exacerbation (HCC) 10/03/2021   Persistent atrial fibrillation (HCC) 05/28/2021   Secondary hypercoagulable state (HCC) 05/28/2021   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   Coronary artery disease 05/08/2021   COVID-19 virus infection 04/23/2021   Erectile dysfunction 04/11/2021   Head trauma 03/07/2021   Left leg pain 04/07/2020   Benign prostatic hyperplasia with nocturia 03/08/2020   Seizures (HCC) 02/18/2020   Atrial fibrillation with rapid ventricular response (HCC) 08/15/2017   Diabetes type 2, controlled (HCC) 07/31/2017   COPD GOLD II  04/03/2017   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) 09/06/2015   Lung cancer (HCC) 11/07/2014   Hepatic  cyst 11/07/2014   Primary hypertension 04/29/2014   Meningioma (HCC) 10/25/2013   Low back pain 10/25/2013   Osteoarthritis 08/18/2012   Bronchitis with bronchospasm 08/14/2011   Atypical chest pain 08/14/2011   KERATOSIS 10/09/2010   SCIATICA, RIGHT 10/09/2010   Hearing loss 05/21/2010   Hyperlipidemia LDL goal <70 05/14/2010   Leukocytosis 05/14/2010   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   RENAL CYST, RIGHT 02/05/2010   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 (Patient taking differently: Take 1,000 mg by mouth daily as needed for headache, fever or moderate pain.) 30 tablet 0   amiodarone (PACERONE) 200 MG tablet Take 1 tablet (200 mg total) by mouth 2 (two) times daily for 7 days, then take 1 tablet (200 mg total) by mouth daily. 44 tablet 0   ASCORBIC ACID PO Take 1 tablet by mouth daily. Vitamin C, unknown strength.     atorvastatin (LIPITOR) 80 MG tablet Take 1 tablet (80 mg total) by mouth daily. 90 tablet 2   benzonatate (TESSALON) 100 MG capsule Take 1 capsule (100 mg total) by mouth 3 (three) times daily as needed. 20 capsule 0   Budeson-Glycopyrrol-Formoterol (BREZTRI AEROSPHERE) 160-9-4.8 MCG/ACT AERO Inhale 2 puffs into the lungs in the morning and at bedtime.     diltiazem (CARDIZEM LA) 120 MG 24 hr tablet Take 1 tablet (120 mg total) by mouth  daily. 30 tablet 0   fluticasone (FLONASE) 50 MCG/ACT nasal spray Place 2 sprays into both nostrils daily. (Patient taking differently: Place 2 sprays into both nostrils daily as needed for allergies.) 16 g 1   furosemide (LASIX) 40 MG tablet Take 1 tablet (40 mg total) by mouth daily. 90 tablet 0   glipiZIDE (GLUCOTROL XL) 2.5 MG 24 hr tablet Take  1 tablet (2.5 mg total) by mouth daily with breakfast. 90 tablet 0   levalbuterol (XOPENEX HFA) 45 MCG/ACT inhaler Inhale 2 puffs into the lungs every 4 (four) hours as needed for wheezing. 15 g 0   omeprazole (PRILOSEC) 40 MG capsule Take 1 capsule (40 mg total) by mouth daily. 90 capsule 0   potassium chloride (KLOR-CON) 10 MEQ tablet Take 1 tablet (10 mEq total) by mouth daily. 90 tablet 3   pregabalin (LYRICA) 25 MG capsule Take 1 capsule (25 mg total) by mouth 2 (two) times daily. 60 capsule 0   rivaroxaban (XARELTO) 20 MG TABS tablet Take 1 tablet (20 mg total) by mouth every evening with supper. 90 tablet 1   spironolactone (ALDACTONE) 25 MG tablet Take 1 tablet (25 mg total) by mouth daily. 90 tablet 1   tamsulosin (FLOMAX) 0.4 MG CAPS capsule Take 1 capsule (0.4 mg total) by mouth daily. 90 capsule 1   No current facility-administered medications for this visit.    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   COPD (chronic obstructive pulmonary disease) (HCC)    Coronary artery disease    Diabetes type 2, controlled (HCC) 07/31/2017   Diverticulitis 07/29/2023   Dyspnea    Dysrhythmia    a fib   GERD (gastroesophageal reflux disease)    Hematuria    refuses work up or referral - understands risks of morbidity / mortality - 11/2008, 12/2008   Heme positive stool    History of hiatal hernia    History of kidney stones    Hyperlipemia    Meningioma (HCC) 10/25/2013   Follows with Dr. Coletta Memos.    Peripheral vascular disease (HCC)    Abdominal Aortic Aneursym   Pneumonia    as a child   Radiation 09/18/15-10/25/15   left lower lobe 70.2 Gy   Seizures (HCC)  02/18/2020   Tobacco abuse     Past Surgical History:  Procedure Laterality Date   CHOLECYSTECTOMY N/A 07/23/2017   Procedure: LAPAROSCOPIC CHOLECYSTECTOMY;  Surgeon: Rodman Pickle, MD;  Location: [facility];  Service: General;  Laterality: N/A;   CLIPPING OF ATRIAL APPENDAGE Left 05/08/2021   Procedure: CLIPPING OF ATRIAL APPENDAGE USING 45 ATRICLIP;  Surgeon: Linden Dolin, MD;  Location: [facility] OR;  Service: Open Heart Surgery;  Laterality: Left;   COLONOSCOPY     CORONARY ARTERY BYPASS GRAFT N/A 05/08/2021   Procedure: CORONARY ARTERY BYPASS GRAFTING (CABG)X 3 USING LEFT INTERNAL MAMMARY ARTERY AND RIGHT GREATER SAPEHNOUS VEIN;  Surgeon: Linden Dolin, MD;  Location: [facility] OR;  Service: Open Heart Surgery;  Laterality: N/A;   ENDOVEIN HARVEST OF GREATER SAPHENOUS VEIN Right 05/08/2021   Procedure: ENDOVEIN HARVEST OF GREATER SAPHENOUS VEIN;  Surgeon: Linden Dolin, MD;  Location: [facility] OR;  Service: Open Heart Surgery;  Laterality: Right;   EYE SURGERY Bilateral    Cataracts removed w/ lens implant   HERNIA REPAIR     Left 36 years ago . Right inguinal hernia repair 10-01-17 Dr. Sheliah Hatch   INGUINAL HERNIA REPAIR Right 10/01/2017   Procedure: RIGHT INGUINAL HERNIA REPAIR WITH MESH;  Surgeon: Kinsinger, De Blanch, MD;  Location: [facility];  Service: General;  Laterality: Right;  TAP BLOCK   INSERTION OF MESH Right 10/01/2017   Procedure: INSERTION OF MESH;  Surgeon: Sheliah Hatch De Blanch, MD;  Location: [facility];  Service: General;  Laterality: Right;   IR THORACENTESIS ASP PLEURAL SPACE W/IMG GUIDE  05/18/2021   IR THORACENTESIS ASP PLEURAL SPACE W/IMG GUIDE  06/07/2021   LEFT HEART CATH AND CORONARY ANGIOGRAPHY N/A 04/20/2021   Procedure: LEFT HEART CATH AND CORONARY ANGIOGRAPHY;  Surgeon: Iran Ouch, MD;  Location: [facility] INVASIVE [facility];  Service: Cardiovascular;  Laterality: N/A;   TEE WITHOUT CARDIOVERSION N/A 05/08/2021   Procedure: TRANSESOPHAGEAL ECHOCARDIOGRAM (TEE);  Surgeon: Linden Dolin, MD;  Location: [facility] OR;  Service: Open Heart Surgery;  Laterality: N/A;   TONSILLECTOMY     TONSILLECTOMY     VIDEO BRONCHOSCOPY Bilateral 07/26/2015   Procedure: VIDEO BRONCHOSCOPY WITH FLUORO;  Surgeon: Nyoka Cowden, MD;  Location: [facility]OS[facility];  Service: Cardiopulmonary;  Laterality: Bilateral;   VIDEO BRONCHOSCOPY WITH ENDOBRONCHIAL NAVIGATION N/A 08/23/2015   Procedure: VIDEO BRONCHOSCOPY WITH ENDOBRONCHIAL NAVIGATION;  Surgeon: Delight Ovens, MD;  Location: [facility];  Service: Thoracic;  Laterality: N/A;   VIDEO BRONCHOSCOPY WITH ENDOBRONCHIAL ULTRASOUND N/A 08/23/2015   Procedure: VIDEO BRONCHOSCOPY WITH ENDOBRONCHIAL ULTRASOUND;  Surgeon: Delight Ovens, MD;  Location: [facility] OR;  Service: Thoracic;  Laterality: N/A;    Family History  Problem Relation Age of Onset   Leukemia Father    Emphysema Father    Learning disabilities Son    Atrial fibrillation Son    Leukemia Other    Stroke Other     Social History   Tobacco Use   Smoking status: Former    Current packs/day: 0.00    Average packs/day: 1 pack/day for 57.0 years (57.0 ttl pk-yrs)    Types: Cigarettes, Cigars    Start date: 08/07/1958    Quit date: 08/08/2015    Years since quitting: 8.0   Smokeless tobacco: Former    Types: Chew    Quit date: 11/04/1958   Tobacco comments:    Will smoke cigar every once in awhile.  Vaping daily started a couple weeks ago.  04/18/22 hfb  Substance Use Topics   Alcohol use: Not Currently    Alcohol/week: 0.0 standard drinks of alcohol    Subjective:   Seen for hospital follow up; regular PCP unavailable today; admitted with COPD exacerbation/ A. Fib; unfortunately patient is very confused about his medications that he is supposed to be taking but unfortunately he did not bring any medications to the appointment today; he mentions that his wife passed away earlier this year and he is just having a harder and harder time taking care of himself; nurse who did TOC call had  recommended home health nursing order be placed to help with home medication management;   It does appear that palliative care has been ordered;   Objective:  Vitals:   08/14/23 1116  BP: 138/82  Pulse: 70  SpO2: 98%  Weight: 174 lb (78.9 kg)  Height: 5\' 8"  (1.727 m)  General: Well developed, well nourished, in no acute distress  Skin : Warm and dry.  Head: Normocephalic and atraumatic  Eyes: Sclera and conjunctiva clear; pupils round and reactive to light; extraocular movements intact  Ears: External normal; canals clear; tympanic membranes normal  Oropharynx: Pink, supple. No suspicious lesions  Neck: Supple without thyromegaly, adenopathy  Lungs: no distress/ intermittent wheezing noted CVS exam: rhythm irregular.  Extremities:  Bilateral edema- per patient, "normal" apperance Vessels: Symmetric bilaterally  Neurologic: Alert and oriented; speech intact; face symmetrical; moves all extremities well; CNII-XII intact without focal deficit   Assessment:  1. COPD exacerbation (HCC)   2. Persistent atrial fibrillation (HCC)   3. Dyspnea on exertion   4. Acute on chronic diastolic congestive heart failure (HCC)     Plan:  Will check CBC, CMP, BNP today; will update CXR today;  Referral to home health for medication management and stressed to patient to be on the lookout for palliative care provider to reach out to establish care;  Order for cardiology updated since discharge summary indicated need for close cardiac follow up;   No follow-ups on file.  Orders Placed This Encounter  Procedures   DG Chest 2 View    Standing Status:   Future    Standing Expiration Date:   08/13/2024    Order Specific Question:   Reason for Exam (SYMPTOM  OR DIAGNOSIS REQUIRED)    Answer:   wheezing    Order Specific Question:   Preferred imaging location?    Answer:   [facility]   CBC with Differential/Platelet   Comp Met (CMET)   B Nat Peptide   Ambulatory referral to Home  Health    Referral Priority:   Routine    Referral Type:   Home Health Care    Referral Reason:   Specialty Services Required    Requested Specialty:   Home Health Services    Number of Visits Requested:   1   Ambulatory referral to Cardiology    Referral Priority:   Urgent    Referral Type:   Consultation    Referral Reason:   Specialty Services Required    Referred to Provider:   Nahser, Deloris Ping, MD    Number of Visits Requested:   1    Requested Prescriptions    No prescriptions requested or ordered in this encounter

## 2023-08-14 NOTE — Transitions of Care (Post Inpatient/ED Visit) (Signed)
08/14/2023  Name: Robert Bates MRN: [id number] DOB: 1940-12-12  Today's TOC FU Call Status: Today's TOC FU Call Status:: Successful TOC FU Call Completed TOC FU Call Complete Date: 08/14/23 Patient's Name and Date of Birth confirmed.  Transition Care Management Follow-up Telephone Call Date of Discharge: 08/09/23 Discharge Facility: [facility]) Type of Discharge: Inpatient Admission Primary Inpatient Discharge Diagnosis:: COPD exacerbation; A-Fib How have you been since you were released from the hospital?: Worse ("I am okay, yesterday I started getting more swelling and shortness of breath.  I am on my way to the doctors office today.  I get confused around all these medicines- my niece usually helps, but she has family in the hospital and can't help right now") Any questions or concerns?: Yes Patient Questions/Concerns:: Reports increasing SOB/ swelling post-hospital discharge; ongoing difficulty managing medications-- niece usually helps and will continue to help-- but she has family member in hospital and is not able to help right now Patient Questions/Concerns Addressed: Notified Provider of Patient Questions/Concerns, Other: (encouraged patient to go to provider appointments; made PCP covering provider aware of patient's clinical concerns today; made established practice pharmacist and RN CM aware of TOC call today- confirmed RN CM has scheduled call next week)  Secure chat in EHR with covering PCP provider re: concerns patient verbalized today-- he is currently in route to HFU office visit with PCP provider  Items Reviewed: Did you receive and understand the discharge instructions provided?: Yes (thoroughly reviewed with patient who verbalizes fair understanding of same) Medications obtained,verified, and reconciled?: Yes (Medications Reviewed) (Full medication reconciliation/ review completed; concerns/ discrepancies identified; currently self-manages medications and has  questions/ concerns today-- notified established Dealer and RN CM; pt. states niece will resume assisting "soon") Any new allergies since your discharge?: No Dietary orders reviewed?: Yes Type of Diet Ordered:: "Healthy"  "My appetite is not great" Do you have support at home?: Yes People in Home: child(ren), adult Name of Support/Comfort Primary Source: Reports independent in self-care activities; resides with supportive adult son who assists as/ if needed/ indicated-- niece Steward Drone also assists with medications and care needs  Medications Reviewed Today: Medications Reviewed Today     Reviewed by Michaela Corner, RN (Registered Nurse) on 08/14/23 at 1108  Med List Status: <None>   Medication Order Taking? Sig Documenting Provider Last Dose Status Informant  acetaminophen (TYLENOL) 500 MG tablet [id number] Yes Take 1-2 tablets (500-1,000 mg total) by mouth every 6 (six) hours as needed.  Patient taking differently: Take 1,000 mg by mouth daily as needed for headache, fever or moderate pain.   Ardelle Balls, PA-C Taking Active Self, Pharmacy Records           Med Note [place], [place] B   Wed Jul 16, 2023 11:25 AM)    amiodarone (PACERONE) 200 MG tablet [id number] Yes Take 1 tablet (200 mg total) by mouth 2 (two) times daily for 7 days, then take 1 tablet (200 mg total) by mouth daily. Joseph Art, DO Taking Active   ASCORBIC ACID PO [id number] Yes Take 1 tablet by mouth daily. Vitamin C, unknown strength. [provider] Taking Active Self, Pharmacy Records           Med Note [facility] [place] [facility]   Tue Aug 05, 2023  1:58 AM) Taken occasionally  atorvastatin (LIPITOR) 80 MG tablet [id number] Yes Take 1 tablet (80 mg total) by mouth daily. Nahser, Deloris Ping, MD Taking Active Self, Pharmacy Records  benzonatate [facility]) 100  MG capsule [id number] Yes Take 1 capsule (100 mg total) by mouth 3 (three) times daily as needed. Sandford Craze, NP Taking Active Self, Pharmacy  Records  Budeson-Glycopyrrol-Formoterol [facility]EROS[facility]) 160-9-4.8 MCG/ACT Sandrea Matte [id number] Yes Inhale 2 puffs into the lungs in the morning and at bedtime. Joseph Art, DO Taking Active   diltiazem (CARDIZEM LA) 120 MG 24 hr tablet [id number] Yes Take 1 tablet (120 mg total) by mouth daily. Sandford Craze, NP Taking Active Self, Pharmacy Records           Med Note Jonnie Kind Aug 14, 2023 11:06 AM) 08/14/23: Reports during [facility] call he "thinks" he is taking  fluticasone (FLONASE) 50 MCG/ACT nasal spray [id number] Yes Place 2 sprays into both nostrils daily.  Patient taking differently: Place 2 sprays into both nostrils daily as needed for allergies.   Saguier, Kateri Mc Taking Active Self, Pharmacy Records           Med Note Tobias Alexander Aug 04, 2023  4:58 PM)    furosemide (LASIX) 40 MG tablet [id number] Yes Take 1 tablet (40 mg total) by mouth daily. Sandford Craze, NP Taking Active Self, Pharmacy Records  glipiZIDE (GLUCOTROL XL) 2.5 MG 24 hr tablet [id number] Yes Take 1 tablet (2.5 mg total) by mouth daily with breakfast. Sandford Craze, NP Taking Active Self, Pharmacy Records  levalbuterol [facility]) 45 MCG/ACT inhaler [id number] No Inhale 2 puffs into the lungs every 4 (four) hours as needed for wheezing. Wanda Plump, MD Unknown Active            Med Note Jonnie Kind Aug 14, 2023 11:07 AM) 08/14/23: Reports during TOC call, he "does not think" he got this medication at time of hospital discharge  omeprazole (PRILOSEC) 40 MG capsule [id number] Yes Take 1 capsule (40 mg total) by mouth daily. Sandford Craze, NP Taking Active Self, Pharmacy Records  potassium chloride (KLOR-CON) 10 MEQ tablet [id number] Yes Take 1 tablet (10 mEq total) by mouth daily. Tereso Newcomer T, PA-C Taking Active Self, Pharmacy Records  pregabalin (LYRICA) 25 MG capsule [id number] Yes Take 1 capsule (25 mg total) by mouth 2 (two) times daily. Sandford Craze, NP Taking Active Self, Pharmacy Records  rivaroxaban (XARELTO) 20 MG TABS tablet [id number] Yes Take 1 tablet (20 mg total) by mouth every evening with supper. Joseph Art, DO Taking Active   spironolactone (ALDACTONE) 25 MG tablet [id number] Yes Take 1 tablet (25 mg total) by mouth daily. Sandford Craze, NP Taking Active Self, Pharmacy Records           Med Note Jonnie Kind Aug 14, 2023 11:08 AM) 08/14/23: Reports during [facility] call he "thinks" he is taking    tamsulosin (FLOMAX) 0.4 MG CAPS capsule [id number] Yes Take 1 capsule (0.4 mg total) by mouth daily. Sandford Craze, NP Taking Active Self, Pharmacy Records           Home Care and Equipment/Supplies: Were Home Health Services Ordered?: No Any new equipment or medical supplies ordered?: No  Functional Questionnaire: Do you need assistance with bathing/showering or dressing?: No Do you need assistance with meal preparation?: No Do you need assistance with eating?: No Do you have difficulty maintaining continence: No Do you need assistance with getting out of bed/getting out of a chair/moving?: No Do you have difficulty managing or taking your medications?: Yes (Established PCP Pharmacist; nice assists with medication management-- hoping to  resume her assistance "soon")  Follow up appointments reviewed: PCP Follow-up appointment confirmed?: Yes Date of PCP follow-up appointment?: 08/14/23 Follow-up Provider: PCP- covering provider Specialist Hospital Follow-up appointment confirmed?: Yes Date of Specialist follow-up appointment?: 09/10/23 Follow-Up Specialty Provider:: pulmonary provider Do you need transportation to your follow-up appointment?: No Do you understand care options if your condition(s) worsen?: Yes-patient verbalized understanding  SDOH Interventions Today    Flowsheet Row Most Recent Value  SDOH Interventions   Food Insecurity Interventions Intervention Not Indicated   Transportation Interventions Intervention Not Indicated  [drives self]      TOC Interventions Today    Flowsheet Row Most Recent Value  TOC Interventions   TOC Interventions Discussed/Reviewed TOC Interventions Discussed, Contacted provider for patient needs  [facility] provider he is seeing for HFU today to share his concerns,  communicated with established Pharmacist and RN CM at PCP practice]      Interventions Today    Flowsheet Row Most Recent Value  Chronic Disease   Chronic disease during today's visit Chronic Obstructive Pulmonary Disease (COPD), Atrial Fibrillation (AFib), Congestive Heart Failure (CHF)  General Interventions   General Interventions Discussed/Reviewed General Interventions Discussed, Durable Medical Equipment (DME), Communication with  Doctor Visits Discussed/Reviewed Doctor Visits Discussed, PCP, Specialist  Durable Medical Equipment (DME) Other  [confirmed not currently requiring/ using assistive devices for ambulation,  states he does have a nebulizer]  PCP/Specialist Visits Compliance with follow-up visit  Communication with PCP/Specialists, RN, Pharmacists  Education Interventions   Education Provided Provided Printed Education  Provided Verbal Education On When to see the doctor, Medication  [Need for medication management and possible home health services,  recent palliative care referral]  Nutrition Interventions   Nutrition Discussed/Reviewed Nutrition Discussed  Pharmacy Interventions   Pharmacy Dicussed/Reviewed Pharmacy Topics Discussed  [Full medication review with updating medication list in EHR per patient report]  Safety Interventions   Safety Discussed/Reviewed Safety Discussed, Fall Risk      Caryl Pina, RN, BSN, Media planner  Transitions of Care  [facility] Population [facility]  [place] [phone]: direct office

## 2023-08-15 ENCOUNTER — Other Ambulatory Visit: Payer: Self-pay | Admitting: Family

## 2023-08-15 ENCOUNTER — Telehealth: Payer: Self-pay | Admitting: Family

## 2023-08-15 NOTE — Telephone Encounter (Signed)
[facility]) called stating that the referral they had received was lacking demographics and the last OV note. Please send both to the following fax number:  [phone]

## 2023-08-18 NOTE — Telephone Encounter (Signed)
Gala Romney called back stating that the referral that was resent looked the exact same as the first with missing demographic and Last OV notes. After discussing with [name], advised him that it would be faxed from our office to ensure he got what he needed.   Fax sent on 10.14.24 @ 3:56p Fax receipt confirmed on 10.14.24 @ 4:08p

## 2023-08-19 ENCOUNTER — Telehealth: Payer: Self-pay | Admitting: Family

## 2023-08-19 ENCOUNTER — Other Ambulatory Visit (HOSPITAL_BASED_OUTPATIENT_CLINIC_OR_DEPARTMENT_OTHER): Payer: Self-pay

## 2023-08-19 ENCOUNTER — Other Ambulatory Visit: Payer: Self-pay | Admitting: Family

## 2023-08-19 ENCOUNTER — Other Ambulatory Visit: Payer: Self-pay

## 2023-08-19 ENCOUNTER — Ambulatory Visit: Payer: Self-pay

## 2023-08-19 DIAGNOSIS — I5032 Chronic diastolic (congestive) heart failure: Secondary | ICD-10-CM

## 2023-08-19 DIAGNOSIS — E1142 Type 2 diabetes mellitus with diabetic polyneuropathy: Secondary | ICD-10-CM

## 2023-08-19 MED ORDER — OMEPRAZOLE 40 MG PO CPDR
40.0000 mg | DELAYED_RELEASE_CAPSULE | Freq: Every day | ORAL | 0 refills | Status: DC
  Filled 2023-08-19 – 2023-08-26 (×2): qty 90, 90d supply, fill #0

## 2023-08-19 MED ORDER — PREGABALIN 25 MG PO CAPS
25.0000 mg | ORAL_CAPSULE | Freq: Two times a day (BID) | ORAL | 0 refills | Status: DC
  Filled 2023-08-19 – 2023-08-26 (×2): qty 60, 30d supply, fill #0

## 2023-08-19 MED ORDER — GABAPENTIN 300 MG PO CAPS
300.0000 mg | ORAL_CAPSULE | Freq: Three times a day (TID) | ORAL | 5 refills | Status: DC
  Filled 2023-08-19 – 2023-08-26 (×2): qty 90, 30d supply, fill #0

## 2023-08-19 NOTE — Addendum Note (Signed)
Addended by: Sandford Craze on: 08/19/2023 04:45 PM   Modules accepted: Orders

## 2023-08-19 NOTE — Patient Outreach (Signed)
Care Coordination   Follow Up Visit Note   08/19/2023 Name: Robert Bates MRN: [id number] DOB: 07/26/1941  Robert Bates is a 82 y.o. year old male who sees Sandford Craze, NP for primary care. I spoke with  Robert Bates by phone today.  What matters to the patients health and wellness today?  Admission: 9/30-10/5/24 with COPD exacerbation. office visit with primary care provider on 08/14/23. Patient reports his mobility has decreased since hospitalization.  Robert Bates reports home health nurse present during telephone call. RNCM discussed weights with home health nurse. Provided weight at discharge on 08/09/23 76.1 kg(167.42 lb and weight at PCP office visit on 08/14/23 174 lb. [facility] nurse Robert Bates [phone]) reports will keep monitoring patient's weight and assess for signs/symptoms of exacerbation. Home health nurse assisting patient with medication management to ensure he is taking medications correctly, and will request home health physical therapy from primary provider and update PCP on patient status.  Goals Addressed             This Visit's Progress    continue to improve post hospitalization       Interventions Today    Flowsheet Row Most Recent Value  Chronic Disease   Chronic disease during today's visit Chronic Obstructive Pulmonary Disease (COPD), Atrial Fibrillation (AFib), Congestive Heart Failure (CHF)  General Interventions   General Interventions Discussed/Reviewed General Interventions Reviewed  [Evaluation of current treatment plan for health condition and patient's adherence to plan.]  Doctor Visits Discussed/Reviewed Doctor Visits Reviewed, PCP, Specialist  PCP/Specialist Visits Compliance with follow-up visit  [reviewed upcoming/scheduled appointments]  Education Interventions   Education Provided Provided Education  Provided Verbal Education On Medication, When to see the doctor  [advised to take medications as prescribed, attend provider visits as  scheduled, contact provider with health questions or concerns.]  Nutrition Interventions   Nutrition Discussed/Reviewed Decreasing salt, Nutrition Discussed  Pharmacy Interventions   Pharmacy Dicussed/Reviewed Pharmacy Topics Discussed  [discussed importance of taking medications as prescribed]  Safety Interventions   Safety Discussed/Reviewed Safety Discussed, Fall Risk, Home Safety  [discussed with home health nurse possible need for HHPT/ rehab,]  Home Safety Assistive Devices            SDOH assessments and interventions completed:  No  Care Coordination Interventions:  Yes, provided   Follow up plan: Follow up call scheduled for 08/27/23    Encounter Outcome:  Patient Visit Completed   Kathyrn Sheriff, RN, MSN, BSN, CCM Care Management Coordinator [phone]

## 2023-08-19 NOTE — Telephone Encounter (Addendum)
Gabapentin dosing is 300mg  TID.  Wt Readings from Last 3 Encounters:  08/14/23 174 lb (78.9 kg)  08/09/23 167 lb 11.2 oz (76.1 kg)  08/04/23 174 lb (78.9 kg)   His weight here at time of follow up was 174 lbs- no changes to medications at this time.    Referral placed for home health PT.

## 2023-08-19 NOTE — Patient Instructions (Signed)
Visit Information  Thank you for taking time to visit with me today. Please don't hesitate to contact me if I can be of assistance to you.   Following are the goals we discussed today:  Continue to take medications as prescribed. Continue to attend provider visits as scheduled Continue to eat healthy, lean meats, vegetables, fruits, avoid saturated and transfats Continue to work with home health staff as recommended Continue to monitor daily weights Contact provider with health questions or concerns.   Our next appointment is by telephone on 08/27/23 at 2:00 pm  Please call the care guide team at [phone] if you need to cancel or reschedule your appointment.   If you are experiencing a Mental Health or Behavioral Health Crisis or need someone to talk to, please call the Suicide and Crisis Lifeline: 988 call the [place] National Suicide Prevention Lifeline: (773) 175-7095 or TTY: 443-171-3015 TTY 314 823 4252) to talk to a trained counselor call 1-800-273-TALK (toll free, 24 hour hotline)  Kathyrn Sheriff, RN, MSN, BSN, CCM Care Management Coordinator [phone]

## 2023-08-19 NOTE — Telephone Encounter (Signed)
Called to ask/advise about the following:  -Pt had come out of the hospital weighing at 167.4 and when his weight was taken today (10.15.24), it was at 175.3. Pt also has 2-3+ pitting edema.  -Pt is currently taking 2 300mg  capsules of Gabapentin per day (one in am, one in pm ) and she wanted to confirm this with Melissa as medication is not on Med list.  -Rep also wanted to add PT to [facility] orders to help with movement.

## 2023-08-20 ENCOUNTER — Other Ambulatory Visit (HOSPITAL_BASED_OUTPATIENT_CLINIC_OR_DEPARTMENT_OTHER): Payer: Self-pay

## 2023-08-20 MED ORDER — MORPHINE SULFATE (CONCENTRATE) 20 MG/ML PO SOLN
5.0000 mg | ORAL | 0 refills | Status: DC | PRN
  Filled 2023-08-20 (×2): qty 30, 20d supply, fill #0

## 2023-08-20 MED ORDER — LORAZEPAM 0.5 MG PO TABS
0.5000 mg | ORAL_TABLET | Freq: Three times a day (TID) | ORAL | 0 refills | Status: DC | PRN
  Filled 2023-08-20: qty 6, 2d supply, fill #0

## 2023-08-20 MED ORDER — IPRATROPIUM-ALBUTEROL 0.5-2.5 (3) MG/3ML IN SOLN
3.0000 mL | Freq: Four times a day (QID) | RESPIRATORY_TRACT | 0 refills | Status: DC | PRN
  Filled 2023-08-20: qty 90, 8d supply, fill #0

## 2023-08-20 MED ORDER — SENNOSIDES 8.6 MG PO TABS
1.0000 | ORAL_TABLET | Freq: Two times a day (BID) | ORAL | 0 refills | Status: DC | PRN
  Filled 2023-08-20: qty 100, 13d supply, fill #0

## 2023-08-20 NOTE — Telephone Encounter (Signed)
Robert Bates , at [facility], notified of dosing for gabapentin. Also no changes on medications and start PT. Patient reports he is feeling "ok at this time, still having swelling on feet and ankles"

## 2023-08-21 ENCOUNTER — Telehealth: Payer: Self-pay | Admitting: Family

## 2023-08-21 NOTE — Telephone Encounter (Signed)
[employer] Pt assistance program called to advise pt was successfully enrolled in the assistance program for his Xarelto.

## 2023-08-23 ENCOUNTER — Other Ambulatory Visit (HOSPITAL_COMMUNITY): Payer: Self-pay

## 2023-08-26 ENCOUNTER — Other Ambulatory Visit: Payer: Self-pay

## 2023-08-26 ENCOUNTER — Other Ambulatory Visit (HOSPITAL_BASED_OUTPATIENT_CLINIC_OR_DEPARTMENT_OTHER): Payer: Self-pay

## 2023-08-26 ENCOUNTER — Other Ambulatory Visit (HOSPITAL_COMMUNITY): Payer: Self-pay

## 2023-08-26 MED ORDER — PROCHLORPERAZINE MALEATE 10 MG PO TABS
10.0000 mg | ORAL_TABLET | Freq: Four times a day (QID) | ORAL | 3 refills | Status: DC | PRN
  Filled 2023-08-26: qty 20, 5d supply, fill #0

## 2023-08-26 MED ORDER — OMEPRAZOLE 40 MG PO CPDR
40.0000 mg | DELAYED_RELEASE_CAPSULE | Freq: Every day | ORAL | 3 refills | Status: DC
  Filled 2023-08-26: qty 14, 14d supply, fill #0

## 2023-08-26 MED ORDER — DILTIAZEM HCL ER 120 MG PO TB24
120.0000 mg | ORAL_TABLET | Freq: Every day | ORAL | 3 refills | Status: DC
  Filled 2023-08-26: qty 14, 14d supply, fill #0

## 2023-08-27 ENCOUNTER — Other Ambulatory Visit (HOSPITAL_COMMUNITY): Payer: Self-pay

## 2023-08-27 ENCOUNTER — Other Ambulatory Visit (HOSPITAL_BASED_OUTPATIENT_CLINIC_OR_DEPARTMENT_OTHER): Payer: Self-pay

## 2023-08-27 ENCOUNTER — Ambulatory Visit: Payer: Self-pay

## 2023-08-27 NOTE — Patient Instructions (Signed)
Visit Information  Thank you for taking time to visit with me today. Please don't hesitate to contact me if I can be of assistance to you.   Following are the goals we discussed today:  Continue to take medications as prescribed. Continue to attend provider visits as scheduled Continue to eat healthy, lean meats, vegetables, fruits, avoid saturated and transfats Contact Provider with health questions or concerns   Someone will reach out to you to schedule next telephone outreach.  Please call the care guide team at [phone] if you need to cancel or reschedule your appointment.   If you are experiencing a Mental Health or Behavioral Health Crisis or need someone to talk to, please call the Suicide and Crisis Lifeline: 988 call the [place] National Suicide Prevention Lifeline: 7654491820 or TTY: 216-467-2257 TTY 807 120 8626) to talk to a trained counselor call 1-800-273-TALK (toll free, 24 hour hotline)  Kathyrn Sheriff, RN, MSN, BSN, CCM Care Management Coordinator [phone]

## 2023-08-27 NOTE — Patient Outreach (Signed)
Care Coordination   Follow Up Visit Note   08/27/2023 Name: Koty Mclanahan MRN: [id number] DOB: 15-Sep-1941  Brodie Christin is a 82 y.o. year old male who sees Sandford Craze, NP for primary care. I spoke with  [name] by phone today.  What matters to the patients health and wellness today?  Mr. Farb reports active with [facility], however does not recall PT initiating care. Patient reports he is now on oxygen at 3l/[place]. He reports he has his medications and states will be receiving mail order from [facility] and request [facility] pharmacy contact number. He reports he has enough medications as this time. Also reports having had spinal injection in the past and request contact information to provider office providing injections. He states the injections has helped in the past and reports he is not having back pain now, but will on occastion, depending on how he moves feel his back. Contact number to [facility] provided. Mr. Brazzel reports his niece is helping with medication management at this time.   Goals Addressed             This Visit's Progress    continue to improve post hospitalization       Interventions Today    Flowsheet Row Most Recent Value  Chronic Disease   Chronic disease during today's visit Atrial Fibrillation (AFib), Congestive Heart Failure (CHF), Chronic Obstructive Pulmonary Disease (COPD)  General Interventions   General Interventions Discussed/Reviewed General Interventions Reviewed  [Evaluation of current treatment plan for health condition and patient's adherence to plan.]  Doctor Visits Discussed/Reviewed Doctor Visits Reviewed, PCP, Specialist  Durable Medical Equipment (DME) Oxygen  [O2 at 3l/[place] [id number] [employer]]  PCP/Specialist Visits Compliance with follow-up visit  [reviewed upcoming appointments. proviced contact number for [facility]]  Education Interventions   Education Provided Provided Education   Provided Verbal Education On Medication, When to see the doctor, Exercise  [advised to work with Karen Chafe as recommended, take medications as prescribed, attend provider appointments as scheduled]  Pharmacy Interventions   Pharmacy Dicussed/Reviewed Pharmacy Topics Reviewed, Medications and their functions, Medication Adherence  [contact number for [facility] pharmacy provided [phone].]  Safety Interventions   Safety Discussed/Reviewed Fall Risk, Safety Reviewed  Home Safety Contact home health agency            SDOH assessments and interventions completed:  No  Care Coordination Interventions:  Yes, provided   Follow up plan:  RNCM will continue to follow.    Encounter Outcome:  Patient Visit Completed   Kathyrn Sheriff, RN, MSN, BSN, CCM Care Management Coordinator [phone]

## 2023-08-29 ENCOUNTER — Encounter: Payer: Self-pay | Admitting: Pharmacist

## 2023-08-29 NOTE — Progress Notes (Signed)
08/29/2023 Name: Robert Bates MRN: [id number] DOB: Dec 13, 1940  Chief Complaint  Patient presents with   Medication Management    Robert Bates is a 82 y.o. year old male who presented for an office visit today.   They were referred to the pharmacist by their PCP for assistance in managing medication access.    Subjective: Patient has been receiving Hospice / Palliative Care services since most recently hospitalization.  Reviewed medication list with patient and his niece who is helping with medication administration.  Per Robert Bates: Hospice has stopped atorvastatin, levoalbuterol and tamsulosin.  Hospice has started: lorazepam, morphine, senna and prochlorperazine   Afib / CHF:  Current medications: amiodarone 200mg  daily for rate control  Xarelto 20mg  daily for anticoagulation / stroke prevention.   Patient's adherence with Xarelto has been spotty over the last 2 or 3 months due to being in the Medicare Coverage gap.  We received letter last week that patient was approved to receive 90 DS from [facility] medication assistance program. Patient endorses that Xarelto was delivered to his sister in laws house today and she is bringing over today. (He doesn't have delivered to his home due to issues with mail)  Medication Access/Adherence  Patient reports affordability concerns with their medications: Yes  Patient reports access/transportation concerns to their pharmacy: No  Patient reports adherence concerns with their medications:  No      Objective:    Lab Results  Component Value Date   HGBA1C 6.7 (H) 04/03/2023    Lab Results  Component Value Date   CREATININE 1.05 08/14/2023   BUN 29 (H) 08/14/2023   NA 136 08/14/2023   K 4.6 08/14/2023   CL 96 08/14/2023   CO2 33 (H) 08/14/2023    Lab Results  Component Value Date   CHOL 142 08/07/2023   HDL 79 08/07/2023   LDLCALC 47 08/07/2023   LDLDIRECT 142.2 12/26/2008   TRIG 81 08/07/2023   CHOLHDL 1.8 08/07/2023     Medications Reviewed Today   Medications were not reviewed in this encounter       Assessment/Plan:  Updated medication list Patient has been approved for medication assistance program for Xarelto thru 11/04/2023.    Robert Bates, PharmD Clinical Pharmacist [facility]

## 2023-09-01 ENCOUNTER — Telehealth: Payer: Self-pay | Admitting: Family

## 2023-09-01 NOTE — Telephone Encounter (Signed)
Per patient he is NOT wanting to change pharmacies. If this pharmacy calls back please get call back information so we can notify them he is going to continue to use [facility] pharmacy.

## 2023-09-01 NOTE — Telephone Encounter (Signed)
[facility] pharmacy called an stated that they faxed refill requests on the 22nd for 12 medications for the patient. She stated that they will fax the request again today. Please advise.

## 2023-09-03 ENCOUNTER — Telehealth: Payer: Self-pay | Admitting: Family

## 2023-09-03 NOTE — Telephone Encounter (Signed)
[facility] to let them know patient confirmed that he does not want to switch pharmacies. He will continue to get his medications from the [facility]

## 2023-09-03 NOTE — Telephone Encounter (Signed)
Robert Bates with [facility] called to request refills on the following: (p: [phone] / f: [phone])  Statin (this was not sent through so Robert Bates was unable to verify which statin)   senna (SENNA LAXATIVE) 8.6 MG tablet  omeprazole (PRILOSEC) 40 MG capsule  amiodarone (PACERONE) 200 MG tablet  diltiazem (CARDIZEM LA) 120 MG 24 hr tablet  furosemide (LASIX) 40 MG tablet  spironolactone (ALDACTONE) 25 MG  potassium chloride (KLOR-CON) 10 MEQ  glipiZIDE (GLUCOTROL XL) 2.5 MG 24 hr tablet  ipratropium-albuterol (DUONEB) 0.5-2.5 (3) MG/3ML SOLN [[id number]]  fluticasone (FLONASE) 50 MCG/ACT nasal spray   tamsulosin (FLOMAX) capsule 0.4 mg   [[id number]]

## 2023-09-10 ENCOUNTER — Ambulatory Visit: Payer: Medicare Other | Admitting: Primary Care

## 2023-09-10 ENCOUNTER — Encounter: Payer: Self-pay | Admitting: Primary Care

## 2023-09-10 ENCOUNTER — Other Ambulatory Visit: Payer: Self-pay

## 2023-09-10 ENCOUNTER — Other Ambulatory Visit (HOSPITAL_BASED_OUTPATIENT_CLINIC_OR_DEPARTMENT_OTHER): Payer: Self-pay

## 2023-09-10 VITALS — BP 126/72 | HR 64 | Temp 97.4°F | Ht 67.0 in | Wt 176.4 lb

## 2023-09-10 DIAGNOSIS — I4891 Unspecified atrial fibrillation: Secondary | ICD-10-CM | POA: Diagnosis not present

## 2023-09-10 DIAGNOSIS — Z85118 Personal history of other malignant neoplasm of bronchus and lung: Secondary | ICD-10-CM

## 2023-09-10 DIAGNOSIS — J439 Emphysema, unspecified: Secondary | ICD-10-CM | POA: Diagnosis not present

## 2023-09-10 DIAGNOSIS — J90 Pleural effusion, not elsewhere classified: Secondary | ICD-10-CM | POA: Diagnosis not present

## 2023-09-10 DIAGNOSIS — R1310 Dysphagia, unspecified: Secondary | ICD-10-CM

## 2023-09-10 DIAGNOSIS — R0602 Shortness of breath: Secondary | ICD-10-CM

## 2023-09-10 DIAGNOSIS — J4489 Other specified chronic obstructive pulmonary disease: Secondary | ICD-10-CM | POA: Diagnosis not present

## 2023-09-10 MED ORDER — LEVALBUTEROL HCL 0.63 MG/3ML IN NEBU
0.6300 mg | INHALATION_SOLUTION | RESPIRATORY_TRACT | 12 refills | Status: DC | PRN
  Filled 2023-09-10: qty 75, 5d supply, fill #0

## 2023-09-10 NOTE — Telephone Encounter (Signed)
Robert Bates with Robert Bates [phone])  pharmacy called to follow up on request for refills to be sent to them. They called and spoke to patient and pt advised Robert Bates that he did not tell anyone he did not want to switch pharmacies so they wanted to follow up on previous request. Orlinda Blalock said she is faxing over request with all refills requested by pt.

## 2023-09-10 NOTE — Patient Instructions (Addendum)
Recommendations: - Continue Breztri Aerpsphere two puffs morning and evening  - Stop ipratropium-albuterol (duoneb)  - Start Levalbuterol nebulizer every 6 hours for shortness of breath/wheezing  - Continue amiodarone and xarelto  - Please return for CXR and labs, depending on results we may either adjust your diuretics or give you a prednisone taper for wheezing heard today on exam   Orders: CXR and labs   Follow-up: 3 months with Dr. Celine Mans

## 2023-09-10 NOTE — Progress Notes (Addendum)
@Patient  ID: Robert Bates, male    DOB: 30-Oct-1941, 82 y.o.   MRN: [id number]  Chief Complaint  Patient presents with   Follow-up    Patient states his chest feels tight since surgery. Pt is complaining of wheezing but states he is breathing fine.     Referring provider: Sandford Craze, NP  HPI: 82 year old male, former smoker quit in 2016.  Past medical history significant for heart failure, coronary artery disease s/p CABG, hypertension, A-fib, COPD Gold 2, lung cancer, pleural effusion, 2 diabetes. Patient of Dr. Celine Mans, last seen on 03/26/2023.  Previous [facility] pulmonary encounter:  07/25/2023 Patient presents today for acute visit. Hx recurrent left sided pleural effusion. He reports increased sob and wheezing symptoms, these are chronic but appear somewhat worse last couple of months. He only coughs at night when lying flat. Cough does not last long. He is currently taking Augmentin for diverticulitis. Stomach pain is still presents but not as severe. His last BM was this morning. No blood in stool. He is using Breztri morning and evening, he may occasionally miss a dose. He takes lasix 40mg  daily, has some leg swelling. CXR today showed left pleural effusion has decreased in size since previous study.     09/10/2023 Discussed the use of AI scribe software for clinical note transcription with the patient, who gave verbal consent to proceed.  History of Present Illness   Patient was admitted 08/04/23-08/09/23 for COPD exacerbation, CHF and afib. Known ejection fraction 55-60%. Presented with increased lower exremity swelling and stable left pleural effusion. Lasic and spironolactone per cards. HR improved with amio gtt and po cardizem. He is on Xarelto.   The patient reports occasional difficulty swallowing, particularly when consuming orange juice and certain foods, which sometimes leads to coughing. This suggests potential silent aspiration, which could be contributing to the lung  infection. The patient also reports occasional episodes of heart racing since being discharged from the hospital, which were self-managed and resolved.  The patient has a history of pleural effusion. He reports persistent wheezing, which he attributes to radiation treatment for lung cancer and subsequent heart surgery. The patient also reports chronic leg swelling, which has not improved since hospital discharge. He takes lasix daily.   The patient lives at home and expresses a strong desire to avoid moving to a nursing home. He has a son and niece who provide support. The patient is also supposed to be receiving home health services whom are sporadically coming to his house. .  Imaging:  08/14/23 CXR >> Stable left perihilar density is noted consistent with radiation change. Stable left basilar scarring is noted. No definite acute abnormality is noted   Allergies  Allergen Reactions   Iodine Swelling    Neck, gland swelling   Iohexol Swelling    Neck, gland swelling   Metformin And Related Diarrhea    Immunization History  Administered Date(s) Administered   Fluad Quad(high Dose 65+) 07/23/2019, 09/04/2020, 07/13/2021, 08/16/2022   Fluad Trivalent(High Dose 65+) 07/22/2023   Influenza Split 08/02/2011, 08/18/2012   Influenza Whole 08/17/2008   Influenza, High Dose Seasonal PF 08/09/2015, 09/30/2016, 07/30/2017, 08/07/2018   Influenza,inj,Quad PF,6+ Mos 07/27/2013, 08/09/2014   PFIZER Comirnaty(Gray Top)Covid-19 Tri-Sucrose Vaccine 02/16/2021   PFIZER(Purple Top)SARS-COV-2 Vaccination 01/28/2020, 02/21/2020, 09/04/2020   Pfizer Covid-19 Vaccine Bivalent Booster 35yrs & up 08/03/2021   Pneumococcal Conjugate-13 08/09/2014   Pneumococcal Polysaccharide-23 05/14/2010   Td 07/25/2008    Past Medical History:  Diagnosis Date   Anxiety  Aortic dilatation (HCC) 05/13/2022   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   COPD (chronic obstructive pulmonary disease) (HCC)    Coronary artery disease    Diabetes type 2, controlled (HCC) 07/31/2017   Diverticulitis 07/29/2023   Dyspnea    Dysrhythmia    a fib   GERD (gastroesophageal reflux disease)    Hematuria    refuses work up or referral - understands risks of morbidity / mortality - 11/2008, 12/2008   Heme positive stool    History of hiatal hernia    History of kidney stones    Hyperlipemia    Meningioma (HCC) 10/25/2013   Follows with Dr. Coletta Memos.    Peripheral vascular disease (HCC)    Abdominal Aortic Aneursym   Pneumonia    as a child   Radiation 09/18/15-10/25/15   left lower lobe 70.2 Gy   Seizures (HCC) 02/18/2020   Tobacco abuse     Tobacco History: Social History   Tobacco Use  Smoking Status Former   Current packs/day: 0.00   Average packs/day: 1 pack/day for 57.0 years (57.0 ttl pk-yrs)   Types: Cigarettes, Cigars   Start date: 08/07/1958   Quit date: 08/08/2015   Years since quitting: 8.0  Smokeless Tobacco Former   Types: Chew   Quit date: 11/04/1958  Tobacco Comments   Will smoke cigar every once in awhile.  Vaping daily started a couple weeks ago.  04/18/22 hfb   Patient states he is still vaping. AB, CMA 09-10-2023   Counseling given: Not Answered Tobacco comments: Will smoke cigar every once in awhile.  Vaping daily started a couple weeks ago.  04/18/22 hfb Patient states he is still vaping. AB, CMA 09-10-2023   Outpatient Medications Prior to Visit  Medication Sig Dispense Refill   acetaminophen (TYLENOL) 500 MG tablet Take 1-2 tablets (500-1,000 mg total) by mouth every 6 (six) hours as needed. (Patient taking differently: Take 1,000 mg by mouth daily as needed for headache, fever or moderate pain (pain score 4-6).) 30 tablet 0   benzonatate (TESSALON) 100 MG capsule Take 1 capsule (100 mg total) by mouth  3 (three) times daily as needed. 20 capsule 0   furosemide (LASIX) 40 MG tablet Take 1 tablet (40 mg total) by mouth daily. 90 tablet 0   glipiZIDE (GLUCOTROL XL) 2.5 MG 24 hr tablet Take 1 tablet (2.5 mg total) by mouth daily with breakfast. 90 tablet 0   ipratropium-albuterol (DUONEB) 0.5-2.5 (3) MG/3ML SOLN Take 3 mLs by nebulization every 6 (six) hours as needed for wheezing/shortness of breath. 90 mL 0   LORazepam (ATIVAN) 0.5 MG tablet Take 1 tablet (0.5 mg total) by mouth every 8 (eight) hours as needed for severe anxiety/shortness of breath not relieved by roxanol 6 tablet 0   morphine (ROXANOL) 20 MG/ML concentrated solution Take 0.25 mLs (5 mg total) by mouth every 4 (four) hours as needed. 30 mL 0   omeprazole (PRILOSEC) 40 MG capsule Take 1 capsule (40 mg total) by mouth daily. (Patient taking differently: Take 40 mg by mouth daily as needed.) 90 capsule 0   potassium chloride (KLOR-CON) 10 MEQ tablet Take 1 tablet (10 mEq total) by mouth daily. 90 tablet 3   prochlorperazine (COMPAZINE) 10 MG tablet Take 10 mg by mouth every 6 (six)  hours as needed.     rivaroxaban (XARELTO) 20 MG TABS tablet Take 1 tablet (20 mg total) by mouth every evening with supper. 90 tablet 1   senna (SENNA LAXATIVE) 8.6 MG tablet Take 1-4 tablets (8.6-34.4 mg total) by mouth 2 (two) times daily as needed for constipation 100 tablet 0   spironolactone (ALDACTONE) 25 MG tablet Take 1 tablet (25 mg total) by mouth daily. 90 tablet 1   amiodarone (PACERONE) 200 MG tablet Take 1 tablet (200 mg total) by mouth 2 (two) times daily for 7 days, then take 1 tablet (200 mg total) by mouth daily. 44 tablet 0   Budeson-Glycopyrrol-Formoterol (BREZTRI AEROSPHERE) 160-9-4.8 MCG/ACT AERO Inhale 2 puffs into the lungs in the morning and at bedtime.     diltiazem (CARDIZEM LA) 120 MG 24 hr tablet Take 1 tablet (120 mg total) by mouth daily. (Patient not taking: Reported on 08/29/2023) 30 tablet 0   fluticasone (FLONASE) 50 MCG/ACT  nasal spray Place 2 sprays into both nostrils daily. (Patient taking differently: Place 2 sprays into both nostrils daily as needed for allergies.) 16 g 1   No facility-administered medications prior to visit.      Review of Systems  Review of Systems  Constitutional: Negative.   HENT: Negative.    Respiratory:  Positive for cough and wheezing.   Cardiovascular: Negative.      Physical Exam  BP 126/72 (BP Location: Right Arm, Patient Position: Sitting, Cuff Size: Normal)   Pulse 64   Temp (!) 97.4 F (36.3 C) (Oral)   Ht 5\' 7"  (1.702 m)   Wt 176 lb 6.4 oz (80 kg)   SpO2 97%   BMI 27.63 kg/m  Physical Exam Constitutional:      Appearance: Normal appearance.  HENT:     Head: Normocephalic and atraumatic.  Cardiovascular:     Rate and Rhythm: Normal rate and regular rhythm.     Comments: RRR; Hx AFIB Pulmonary:     Effort: Pulmonary effort is normal.     Breath sounds: Wheezing present.  Neurological:     General: No focal deficit present.     Mental Status: He is alert and oriented to person, place, and time. Mental status is at baseline.  Psychiatric:        Mood and Affect: Mood normal.        Behavior: Behavior normal.        Thought Content: Thought content normal.        Judgment: Judgment normal.      Lab Results:  CBC    Component Value Date/Time   WBC 14.0 (H) 08/14/2023 1203   RBC 4.94 08/14/2023 1203   HGB 13.7 08/14/2023 1203   HGB 11.2 (L) 06/18/2023 1125   HGB 14.7 06/17/2017 1315   HGB 14.8 08/17/2015 1318   HCT 42.6 08/14/2023 1203   HCT 43.8 06/17/2017 1315   HCT 44.4 08/17/2015 1318   PLT 230.0 08/14/2023 1203   PLT 268 06/18/2023 1125   PLT 230 06/17/2017 1315   PLT 276 08/17/2015 1318   MCV 86.2 08/14/2023 1203   MCV 93 06/17/2017 1315   MCV 91.9 08/17/2015 1318   MCH 27.0 08/09/2023 0532   MCHC 32.1 08/14/2023 1203   RDW 18.3 (H) 08/14/2023 1203   RDW 13.5 06/17/2017 1315   RDW 13.3 08/17/2015 1318   LYMPHSABS 1.1  08/14/2023 1203   LYMPHSABS 1.2 06/17/2017 1315   LYMPHSABS 2.6 08/17/2015 1318   MONOABS 1.4 (H) 08/14/2023  1203   MONOABS 0.8 08/17/2015 1318   EOSABS 0.0 08/14/2023 1203   EOSABS 0.2 06/17/2017 1315   BASOSABS 0.0 08/14/2023 1203   BASOSABS 0.0 06/17/2017 1315   BASOSABS 0.1 08/17/2015 1318    BMET    Component Value Date/Time   NA 136 08/14/2023 1203   NA 141 04/02/2023 1123   NA 140 06/17/2017 1315   K 4.6 08/14/2023 1203   K 4.5 06/17/2017 1315   CL 96 08/14/2023 1203   CO2 33 (H) 08/14/2023 1203   CO2 28 06/17/2017 1315   GLUCOSE 104 (H) 08/14/2023 1203   GLUCOSE 95 06/17/2017 1315   BUN 29 (H) 08/14/2023 1203   BUN 16 04/02/2023 1123   BUN 18.1 06/17/2017 1315   CREATININE 1.05 08/14/2023 1203   CREATININE 1.00 06/18/2023 1125   CREATININE 0.84 01/17/2023 1552   CREATININE 0.8 06/17/2017 1315   CALCIUM 9.3 08/14/2023 1203   CALCIUM 9.8 06/17/2017 1315   GFRNONAA >60 08/09/2023 0532   GFRNONAA >60 06/18/2023 1125   GFRAA >60 06/20/2020 1405    BNP    Component Value Date/Time   BNP 134.1 (H) 08/04/2023 1328   BNP 63 07/25/2023 1540    ProBNP    Component Value Date/Time   PROBNP 83.0 08/14/2023 1203    Imaging: DG Chest 2 View  Result Date: 08/14/2023 CLINICAL DATA:  Wheezing. EXAM: CHEST - 2 VIEW COMPARISON:  August 07, 2023. FINDINGS: Stable cardiomediastinal silhouette. Sternotomy wires are noted. Stable left perihilar density is noted consistent with radiation change. Stable left basilar scarring is noted. No definite acute abnormality is noted. Bony thorax is unremarkable. IMPRESSION: Stable chronic findings seen in left lung as noted above. No definite acute abnormality seen. Electronically Signed   By: Lupita Raider M.D.   On: 08/14/2023 15:17     Assessment & Plan:   1. Shortness of breath - DG Chest 2 View; Future - Brain natriuretic peptide; Future - Basic metabolic panel; Future  2. Dysphagia, unspecified type - SLP eval and  treat; Future  3. Recurrent pleural effusion on left  4. History of lung cancer  5. COPD with chronic bronchitis and emphysema (HCC)  6. Atrial fibrillation, unspecified type [facility])      Atrial Fibrillation Recent hospitalization for AFib, now controlled. Patient reported one episode of tachycardia since discharge. Currently on Amiodarone and Xarelto. -Continue current medications. -Follow up with cardiology on 09/18/2023.  Dysphagia Patient reports coughing and choking sensation when eating certain foods and drinking liquids, suggestive of possible aspiration. -Refer to speech and therapy specialist for swallowing evaluation. -Provide information on dysphagia diet.  Chronic Obstructive Pulmonary Disease (COPD) Persistent wheezing, possibly due to fluid overload secondary to heart failure or COPD exacerbation. -Discontinue DuoNeb (Ipratropium/Albuterol) nebulizer due to AFib. -Start Xopenex (Levalbuterol) nebulizer. -Continue Breztri inhaler. -Order chest X-ray and labs (BNP) to evaluate for fluid overload. -If BNP and X-ray are normal, consider trial of Prednisone for COPD exacerbation.  Leg swelling  Persistent LE swelling despite daily Lasix 40mg . -Continue Lasix 40mg  daily.  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 No evidence of recurrent or metastatic disease on last CT scan in March 2024. -Repeat CT scan in March 2025 as ordered by Dr. Arbutus Ped.  General Health Maintenance -Check with pharmacy regarding RSV vaccine. -Follow up in 3 months.      Glenford Bayley, NP 09/10/2023

## 2023-09-15 ENCOUNTER — Other Ambulatory Visit (HOSPITAL_BASED_OUTPATIENT_CLINIC_OR_DEPARTMENT_OTHER): Payer: Self-pay

## 2023-09-16 ENCOUNTER — Ambulatory Visit: Payer: Medicare Other | Admitting: Family

## 2023-09-16 ENCOUNTER — Other Ambulatory Visit (HOSPITAL_BASED_OUTPATIENT_CLINIC_OR_DEPARTMENT_OTHER): Payer: Self-pay

## 2023-09-16 ENCOUNTER — Ambulatory Visit (INDEPENDENT_AMBULATORY_CARE_PROVIDER_SITE_OTHER): Payer: Medicare Other | Admitting: Family

## 2023-09-16 VITALS — BP 135/71 | HR 66 | Temp 97.4°F | Resp 16 | Ht 67.0 in | Wt 178.0 lb

## 2023-09-16 DIAGNOSIS — R339 Retention of urine, unspecified: Secondary | ICD-10-CM

## 2023-09-16 DIAGNOSIS — J449 Chronic obstructive pulmonary disease, unspecified: Secondary | ICD-10-CM | POA: Diagnosis not present

## 2023-09-16 DIAGNOSIS — N481 Balanitis: Secondary | ICD-10-CM

## 2023-09-16 DIAGNOSIS — I5033 Acute on chronic diastolic (congestive) heart failure: Secondary | ICD-10-CM

## 2023-09-16 DIAGNOSIS — Z7984 Long term (current) use of oral hypoglycemic drugs: Secondary | ICD-10-CM | POA: Diagnosis not present

## 2023-09-16 DIAGNOSIS — I719 Aortic aneurysm of unspecified site, without rupture: Secondary | ICD-10-CM

## 2023-09-16 DIAGNOSIS — E119 Type 2 diabetes mellitus without complications: Secondary | ICD-10-CM | POA: Diagnosis not present

## 2023-09-16 DIAGNOSIS — C3490 Malignant neoplasm of unspecified part of unspecified bronchus or lung: Secondary | ICD-10-CM

## 2023-09-16 DIAGNOSIS — I714 Abdominal aortic aneurysm, without rupture, unspecified: Secondary | ICD-10-CM

## 2023-09-16 DIAGNOSIS — I10 Essential (primary) hypertension: Secondary | ICD-10-CM

## 2023-09-16 DIAGNOSIS — I4891 Unspecified atrial fibrillation: Secondary | ICD-10-CM

## 2023-09-16 LAB — HEMOGLOBIN A1C: Hgb A1c MFr Bld: 7.2 % — ABNORMAL HIGH (ref 4.6–6.5)

## 2023-09-16 NOTE — Progress Notes (Unsigned)
Subjective:     Patient ID: Robert Bates, male    DOB: 12/17/1940, 82 y.o.   MRN: [id number]  Chief Complaint  Patient presents with  . Hypertension    Here for follow up    HPI  Discussed the use of AI scribe software for clinical note transcription with the patient, who gave verbal consent to proceed.  History of Present Illness         He reports some improvement in breathing after using a portable inhaler and a change in medication. However, he still experiences discomfort in his back. He is under palliative care and has access to medical services and medications as needed.  The patient also reports issues with urination, describing a decrease in the volume of urine. He does not report any discomfort or pain during urination. He also mentions a previous issue with a rash in the groin area, which has since resolved.  The patient is on multiple medications, some of which he believes may be causing some of his symptoms. He expresses difficulty in managing his medication regimen due to the number of medications he is on.   Lab Results  Component Value Date   HGBA1C 6.7 (H) 04/03/2023   HGBA1C 7.4 (H) 11/15/2022   HGBA1C 6.4 04/19/2022   Lab Results  Component Value Date   MICROALBUR 0.9 01/15/2022   LDLCALC 47 08/07/2023   CREATININE 1.05 08/14/2023     Health Maintenance Due  Topic Date Due  . Zoster Vaccines- Shingrix (1 of 2) Never done  . DTaP/Tdap/Td (2 - Tdap) 07/25/2018  . Diabetic kidney evaluation - Urine ACR  01/16/2023  . COVID-19 Vaccine (6 - 2023-24 season) 07/06/2023  . Medicare Annual Wellness (AWV)  10/03/2023    Past Medical History:  Diagnosis Date  . Anxiety   . 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 Arthritis   . 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  [name] COPD (chronic obstructive  pulmonary disease) (HCC)   . Coronary artery disease   . Diabetes type 2, controlled (HCC) 07/31/2017  . Diverticulitis 07/29/2023  . Dyspnea   . Dysrhythmia    a fib  . GERD (gastroesophageal reflux disease)   . Hematuria    refuses work up or referral - understands risks of morbidity / mortality - 11/2008, 12/2008  . Heme positive stool   . History of hiatal hernia   . History of kidney stones   . Hyperlipemia   . Meningioma (HCC) 10/25/2013   Follows with Dr. Coletta Memos.   . Peripheral vascular disease (HCC)    Abdominal Aortic Aneursym  . Pneumonia    as a child  . Radiation 09/18/15-10/25/15   left lower lobe 70.2 Gy  . Seizures (HCC) 02/18/2020  . Tobacco abuse     Past Surgical History:  Procedure Laterality Date  . CHOLECYSTECTOMY N/A 07/23/2017   Procedure: LAPAROSCOPIC CHOLECYSTECTOMY;  Surgeon: Kinsinger, De Blanch, MD;  Location: [facility];  Service: General;  Laterality: N/A;  . CLIPPING OF ATRIAL APPENDAGE Left 05/08/2021   Procedure: CLIPPING OF ATRIAL APPENDAGE USING 45 ATRICLIP;  Surgeon: Linden Dolin, MD;  Location: [facility];  Service: Open Heart Surgery;  Laterality: Left;  . COLONOSCOPY    . CORONARY ARTERY BYPASS GRAFT N/A 05/08/2021   Procedure: CORONARY ARTERY BYPASS GRAFTING (CABG)X 3 USING  LEFT INTERNAL MAMMARY ARTERY AND RIGHT GREATER SAPEHNOUS VEIN;  Surgeon: Linden Dolin, MD;  Location: [facility];  Service: Open Heart Surgery;  Laterality: N/A;  . ENDOVEIN HARVEST OF GREATER SAPHENOUS VEIN Right 05/08/2021   Procedure: ENDOVEIN HARVEST OF GREATER SAPHENOUS VEIN;  Surgeon: Linden Dolin, MD;  Location: [facility];  Service: Open Heart Surgery;  Laterality: Right;  . EYE SURGERY Bilateral    Cataracts removed w/ lens implant  . HERNIA REPAIR     Left 36 years ago . Right inguinal hernia repair 10-01-17 Dr. Sheliah Hatch  . INGUINAL HERNIA REPAIR Right 10/01/2017   Procedure: RIGHT INGUINAL HERNIA REPAIR WITH MESH;  Surgeon: Kinsinger, De Blanch, MD;   Location: [facility];  Service: General;  Laterality: Right;  TAP BLOCK  . INSERTION OF MESH Right 10/01/2017   Procedure: INSERTION OF MESH;  Surgeon: Kinsinger, De Blanch, MD;  Location: [facility];  Service: General;  Laterality: Right;  . IR THORACENTESIS ASP PLEURAL SPACE W/IMG GUIDE  05/18/2021  . IR THORACENTESIS ASP PLEURAL SPACE W/IMG GUIDE  06/07/2021  . LEFT HEART CATH AND CORONARY ANGIOGRAPHY N/A 04/20/2021   Procedure: LEFT HEART CATH AND CORONARY ANGIOGRAPHY;  Surgeon: Iran Ouch, MD;  [facility];  Service: Cardiovascular;  Laterality: N/A;  . TEE WITHOUT CARDIOVERSION N/A 05/08/2021   Procedure: TRANSESOPHAGEAL ECHOCARDIOGRAM (TEE);  Surgeon: Linden Dolin, MD;  [facility];  Service: Open Heart Surgery;  Laterality: N/A;  . TONSILLECTOMY    . TONSILLECTOMY    . VIDEO BRONCHOSCOPY Bilateral 07/26/2015   Procedure: VIDEO BRONCHOSCOPY WITH FLUORO;  Surgeon: Nyoka Cowden, MD;  [facility];  Service: Cardiopulmonary;  Laterality: Bilateral;  . VIDEO BRONCHOSCOPY WITH ENDOBRONCHIAL NAVIGATION N/A 08/23/2015   Procedure: VIDEO BRONCHOSCOPY WITH ENDOBRONCHIAL NAVIGATION;  Surgeon: Delight Ovens, MD;  [facility];  Service: Thoracic;  Laterality: N/A;  . VIDEO BRONCHOSCOPY WITH ENDOBRONCHIAL ULTRASOUND N/A 08/23/2015   Procedure: VIDEO BRONCHOSCOPY WITH ENDOBRONCHIAL ULTRASOUND;  Surgeon: Delight Ovens, MD;  Location: [facility];  Service: Thoracic;  Laterality: N/A;    Family History  Problem Relation Age of Onset  . Leukemia Father   . Emphysema Father   . Learning disabilities Son   . Atrial fibrillation Son   . Leukemia Other   . Stroke Other     Social History   Socioeconomic History  . Marital status: Widowed    Spouse name: Not on file  . Number of children: 2  . Years of education: Not on file  . Highest education level: Not on file  Occupational History  . 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 Smoking status: Former    Current packs/day: 0.00    Average packs/day: 1 pack/day for 57.0 years (57.0 ttl pk-yrs)    Types: Cigarettes, Cigars    Start date: 08/07/1958    Quit date: 08/08/2015    Years since quitting: 8.1  . Smokeless tobacco: Former    Types: Chew    Quit date: 11/04/1958  . Tobacco comments:    Will smoke cigar every once in awhile.  Vaping daily started a couple weeks ago.  04/18/22 hfb    Patient states he is still vaping. AB, CMA 09-10-2023  Vaping Use  . Vaping status: Some Days  Substance and Sexual Activity  . Alcohol use: Not Currently    Alcohol/week: 0.0 standard  drinks of alcohol  . Drug use: No  . Sexual activity: Not Currently  Other Topics Concern  . Not on file  Social History Narrative  . Not on file   Social Determinants of Health   Financial Resource Strain: Low Risk  (05/22/2023)   Overall Financial Resource Strain (CARDIA)   . Difficulty of Paying Living Expenses: Not very hard  Food Insecurity: No Food Insecurity (08/14/2023)   Hunger Vital Sign   . Worried About Programme researcher, broadcasting/film/video in the Last Year: Never true   . Ran Out of Food in the Last Year: Never true  Transportation Needs: No Transportation Needs (08/14/2023)   PRAPARE - Transportation   . Lack of Transportation (Medical): No   . Lack of Transportation (Non-Medical): No  Physical Activity: Insufficiently Active (08/05/2021)   Exercise Vital Sign   . Days of Exercise per Week: 2 days   . Minutes of Exercise per Session: 40 min  Stress: Stress Concern Present (05/22/2023)   [employer] of Occupational Health - Occupational Stress Questionnaire   . Feeling of Stress : Rather much  Social Connections: Moderately Isolated (05/13/2022)   Social Connection and Isolation Panel [NHANES]   . Frequency of Communication with Friends and Family: Twice a week   . Frequency of Social Gatherings with Friends and Family: Once a week   .  Attends Religious Services: Never   . Active Member of Clubs or Organizations: No   . Attends Banker Meetings: Never   . Marital Status: Married  Catering manager Violence: Not At Risk (08/04/2023)   Humiliation, Afraid, Rape, and Kick questionnaire   . Fear of Current or Ex-Partner: No   . Emotionally Abused: No   . Physically Abused: No   . Sexually Abused: No    Outpatient Medications Prior to Visit  Medication Sig Dispense Refill  . acetaminophen (TYLENOL) 500 MG tablet Take 1-2 tablets (500-1,000 mg total) by mouth every 6 (six) hours as needed. (Patient taking differently: Take 1,000 mg by mouth daily as needed for headache, fever or moderate pain (pain score 4-6).) 30 tablet 0  . benzonatate (TESSALON) 100 MG capsule Take 1 capsule (100 mg total) by mouth 3 (three) times daily as needed. 20 capsule 0  . furosemide (LASIX) 40 MG tablet Take 1 tablet (40 mg total) by mouth daily. 90 tablet 0  . glipiZIDE (GLUCOTROL XL) 2.5 MG 24 hr tablet Take 1 tablet (2.5 mg total) by mouth daily with breakfast. 90 tablet 0  . levalbuterol (XOPENEX) 0.63 MG/3ML nebulizer solution Take 3 mLs (0.63 mg total) by nebulization every 4 (four) hours as needed for wheezing or shortness of breath. 75 mL 12  . LORazepam (ATIVAN) 0.5 MG tablet Take 1 tablet (0.5 mg total) by mouth every 8 (eight) hours as needed for severe anxiety/shortness of breath not relieved by roxanol 6 tablet 0  . morphine (ROXANOL) 20 MG/ML concentrated solution Take 0.25 mLs (5 mg total) by mouth every 4 (four) hours as needed. 30 mL 0  . omeprazole (PRILOSEC) 40 MG capsule Take 1 capsule (40 mg total) by mouth daily. (Patient taking differently: Take 40 mg by mouth daily as needed.) 90 capsule 0  . potassium chloride (KLOR-CON) 10 MEQ tablet Take 1 tablet (10 mEq total) by mouth daily. 90 tablet 3  . prochlorperazine (COMPAZINE) 10 MG tablet Take 10 mg by mouth every 6 (six) hours as needed.    . rivaroxaban (XARELTO) 20 MG  TABS tablet Take  1 tablet (20 mg total) by mouth every evening with supper. 90 tablet 1  . senna (SENNA LAXATIVE) 8.6 MG tablet Take 1-4 tablets (8.6-34.4 mg total) by mouth 2 (two) times daily as needed for constipation 100 tablet 0  . spironolactone (ALDACTONE) 25 MG tablet Take 1 tablet (25 mg total) by mouth daily. 90 tablet 1   No facility-administered medications prior to visit.    Allergies  Allergen Reactions  . Iodine Swelling    Neck, gland swelling  . Iohexol Swelling    Neck, gland swelling  . Metformin And Related Diarrhea    ROS    See HPI Objective:    Physical Exam Constitutional:      General: He is not in acute distress.    Appearance: He is well-developed.  HENT:     Head: Normocephalic and atraumatic.  Cardiovascular:     Rate and Rhythm: Normal rate and regular rhythm.     Heart sounds: No murmur heard. Pulmonary:     Effort: Pulmonary effort is normal. No respiratory distress.     Breath sounds: Normal breath sounds. No wheezing or rales.  Skin:    General: Skin is warm and dry.  Neurological:     Mental Status: He is alert and oriented to person, place, and time.  Psychiatric:        Behavior: Behavior normal.        Thought Content: Thought content normal.     BP 135/71 (BP Location: Right Arm, Patient Position: Sitting, Cuff Size: Small)   Pulse 66   Temp (!) 97.4 F (36.3 C) (Oral)   Resp 16   Ht 5\' 7"  (1.702 m)   Wt 178 lb (80.7 kg)   SpO2 (!) 65%   BMI 27.88 kg/m  Wt Readings from Last 3 Encounters:  09/16/23 178 lb (80.7 kg)  09/10/23 176 lb 6.4 oz (80 kg)  08/14/23 174 lb (78.9 kg)       Assessment & Plan:   Problem List Items Addressed This Visit       Unprioritized   Urinary retention - Primary    Reports voiding well.        Diabetes type 2, controlled (HCC)   Relevant Orders   Urine Microalbumin w/creat. ratio   HgB A1c    I am having Robert Bates maintain his acetaminophen, potassium chloride,  spironolactone, furosemide, glipiZIDE, benzonatate, rivaroxaban, omeprazole, LORazepam, morphine, senna, prochlorperazine, and levalbuterol.  No orders of the defined types were placed in this encounter.

## 2023-09-16 NOTE — Assessment & Plan Note (Signed)
Reports voiding well.

## 2023-09-17 ENCOUNTER — Encounter: Payer: Self-pay | Admitting: Family

## 2023-09-17 LAB — MICROALBUMIN / CREATININE URINE RATIO
Creatinine,U: 118.4 mg/dL
Microalb Creat Ratio: 1.5 mg/g (ref 0.0–30.0)
Microalb, Ur: 1.8 mg/dL (ref 0.0–1.9)

## 2023-09-17 MED ORDER — BREZTRI AEROSPHERE 160-9-4.8 MCG/ACT IN AERO
2.0000 | INHALATION_SPRAY | Freq: Two times a day (BID) | RESPIRATORY_TRACT | Status: DC
Start: 2023-09-17 — End: 2024-04-14

## 2023-09-17 NOTE — Assessment & Plan Note (Signed)
On xarelto, rate stable. Monitor.

## 2023-09-17 NOTE — Patient Instructions (Signed)
VISIT SUMMARY:  During today's visit, we discussed your ongoing respiratory issues, back pain, urinary symptoms, and other health concerns. You reported some improvement in your breathing with the new inhaler and a change in medication, but you still experience back pain. We also reviewed your urinary symptoms, resolved groin rash, and persistent lower extremity swelling. We have made some adjustments to your treatment plan to help manage these issues.  YOUR PLAN:  -CHRONIC RESPIRATORY DISEASE: Chronic respiratory disease refers to long-term breathing problems. Your symptoms have improved with the new inhaler Breztri, so please continue using it as prescribed.  -BACK PAIN: Back pain can be caused by various factors, including muscle strain or underlying conditions. You have started taking steroid pills, which have shown some improvement. Please continue taking the steroid pills as prescribed.  -URINARY SYMPTOMS: You have reported a decrease in urine output but no pain or difficulty urinating. We will continue to monitor your symptoms.  -GROIN RASH: The rash in your groin area has resolved with treatment. Continue with the current treatment as needed if the rash reappears.  -LOWER EXTREMITY EDEMA: Lower extremity edema is swelling in the legs. Despite taking Lasix and Aldactone, you still have swelling. Please elevate your legs as much as possible during the day and limit your sodium intake.  -GENERAL HEALTH MAINTENANCE: We will check your aorta in 2026 as previously planned. Please return to the clinic in three months for a routine follow-up and complete your blood work today.  INSTRUCTIONS:  Please return to the clinic in three months for a routine follow-up. Complete your blood work today.

## 2023-09-17 NOTE — Assessment & Plan Note (Signed)
Per imaging reports it looks like he may have a thoracic aneurysm and an abdominal aortic aneurysm. Will update aortic US.

## 2023-09-17 NOTE — Assessment & Plan Note (Signed)
>>  ASSESSMENT AND PLAN FOR AORTIC ANEURYSM (HCC) WRITTEN ON 09/17/2023  2:14 PM BY O'SULLIVAN, Koi Zangara, NP  Per imaging reports it looks like he may have a thoracic aneurysm and an abdominal aortic aneurysm. Will update aortic US.

## 2023-09-17 NOTE — Assessment & Plan Note (Addendum)
Lab Results  Component Value Date   HGBA1C 7.2 (H) 09/16/2023   HGBA1C 6.7 (H) 04/03/2023   HGBA1C 7.4 (H) 11/15/2022   Lab Results  Component Value Date   MICROALBUR 1.8 09/16/2023   LDLCALC 47 08/07/2023   CREATININE 1.05 08/14/2023   Fair DM control for his age. Continue glipizide.

## 2023-09-17 NOTE — Assessment & Plan Note (Signed)
He had curative radiotherapy and continues to follow with Dr. Gwenyth Bouillon for surveillance imaging.

## 2023-09-17 NOTE — Assessment & Plan Note (Signed)
Wt Readings from Last 3 Encounters:  09/16/23 178 lb (80.7 kg)  09/10/23 176 lb 6.4 oz (80 kg)  08/14/23 174 lb (78.9 kg)   Fair volume control. Continue furosemide and aldactone.

## 2023-09-17 NOTE — Assessment & Plan Note (Signed)
>>  ASSESSMENT AND PLAN FOR ATRIAL FIBRILLATION (HCC) WRITTEN ON 09/17/2023  2:16 PM BY O'SULLIVAN, Khandi Kernes, NP  On xarelto, rate stable. Monitor.

## 2023-09-17 NOTE — Assessment & Plan Note (Signed)
BP is stable. Continue aldactone.

## 2023-09-17 NOTE — Assessment & Plan Note (Signed)
Resolved following discontinuation of farxiga.

## 2023-09-17 NOTE — Assessment & Plan Note (Signed)
He is noted to have some wheezing on exam.  However, subjectively he feels like his breathing is stable. He should still be taking Breztri per pulmonology but somehow this came off his list. I have added it back.

## 2023-09-18 ENCOUNTER — Ambulatory Visit: Payer: Medicare Other | Attending: Nurse Practitioner | Admitting: Nurse Practitioner

## 2023-09-18 NOTE — Progress Notes (Deleted)
Cardiology Office Note:  .   Date:  09/18/2023  ID:  Robert Bates, DOB September 14, 1941, MRN [id number] PCP: Robert Craze, NP  Robert Bates Providers Cardiologist:  Robert Miss, MD Cardiology APP:  Robert Lecher, PA-C { Click to update primary MD,subspecialty MD or APP then REFRESH:1}   Patient Profile: .      PMH Coronary artery disease S/p CABG 05/2021 (L-LAD, S-OM, S-Dx; LA clipping) C/b  blood loss anemia requiring transfusion, atrial fibrillation with rapid rate and left recurrent pleural effusion - thoracentesis x 2  Paroxysmal atrial fibrillation  S/p L atral clipping 05/2021 Amiodarone; Xarelto  (HFpEF) heart failure with preserved ejection fraction  TTE 11/20/21: EF 55-60, no RWMA, GRII DD, normal RVSF, mildly elevated PASP, moderate LAE, mild RAE, pericardial effusion, mild MR, mild to moderate TR  Pre-CABG dopplers 04/25/21: Bilat ICA 1-39 Peripheral arterial disease  Prior ETOH abuse  S/p cholecystectomy  Aortic atherosclerosis  Thoracic aortic aneurysm CT 6/22: 3.9 cm  Echocardiogram 1/23: 3.6 cm  Abdominal aortic aneurysm  [place] 5/23: 3.2 cm (repeat 2026) Chronic Obstructive Pulmonary Disease Lung CA, Stage IIB (NSC) PET 01/2023 w/o obvious recurrence of CA  L pleural effusion s/p thoracentesis (Dr. Celine Bates - Pulmonology) 01/2023 Hypertension  Hyperlipidemia  Borderline diabetes mellitus  Seen by Robert Newcomer, PA on 03/26/2023.  He had had recent CT that showed left effusion and LUL mass.  Follow-up at [facility] oncology with repeat CT did not show a mass.  PET scan 01/2023 did not show signs of recurrent cancer.  He had thoracentesis with pulmonology 01/2023 and was seen 5/20 with pleuritic chest pain.  CXR showed increasing left pleural effusion and possible pneumonia.  He noted left lower chest discomfort but no substernal chest pain.  He was having shortness of breath with mild to moderate exertion.  His symptoms did improve after thoracentesis.  Last cardiology  clinic visit was 08/04/2023 with Dr. Elease Bates.  He was feeling more short of breath that day.  EKG revealed atrial flutter with 2-1 AV block with heart rate of 172.  He was feeling very fatigued.  He was admitted to the hospital for management and remained there until 08/09/2023.  He was treated for COPD exacerbation as well as AF RVR and acute on chronic HFpEF.  He was discharged on amiodarone and Cardizem for rate control.  No beta-blocker due to history of bradycardia.  Not on SGLT2 inhibitor due to history of yeast infection.       History of Present Illness: .   Robert Bates is a *** 82 y.o. male ***   Discussed the use of AI scribe software for clinical note transcription with the patient, who gave verbal consent to proceed.   ROS: ***       Studies Reviewed: .        *** Risk Assessment/Calculations:   {Does this patient have ATRIAL FIBRILLATION?:[phone]} No BP recorded.  {Refresh Note OR Click here to enter BP  :1}***       Physical Exam:   VS:  There were no vitals taken for this visit.   Wt Readings from Last 3 Encounters:  09/16/23 178 lb (80.7 kg)  09/10/23 176 lb 6.4 oz (80 kg)  08/14/23 174 lb (78.9 kg)    GEN: Well nourished, well developed in no acute distress NECK: No JVD; No carotid bruits CARDIAC: ***RRR, no murmurs, rubs, gallops RESPIRATORY:  Clear to auscultation without rales, wheezing or rhonchi  ABDOMEN: Soft, non-tender, non-distended EXTREMITIES:  No  edema; No deformity     ASSESSMENT AND PLAN: .    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     {Are you ordering a CV Procedure (e.g. stress test, cath, DCCV, TEE, etc)?   Press F2        :[id number]}  Dispo: ***  Signed, Robert Bridegroom, NP-C

## 2023-09-22 DIAGNOSIS — S098XXA Other specified injuries of head, initial encounter: Secondary | ICD-10-CM | POA: Diagnosis not present

## 2023-09-22 DIAGNOSIS — Z743 Need for continuous supervision: Secondary | ICD-10-CM | POA: Diagnosis not present

## 2023-09-22 DIAGNOSIS — S0990XA Unspecified injury of head, initial encounter: Secondary | ICD-10-CM | POA: Diagnosis not present

## 2023-09-22 DIAGNOSIS — W19XXXA Unspecified fall, initial encounter: Secondary | ICD-10-CM | POA: Diagnosis not present

## 2023-09-23 ENCOUNTER — Ambulatory Visit: Payer: Medicare Other | Admitting: Cardiovascular Disease

## 2023-09-23 ENCOUNTER — Telehealth: Payer: Self-pay | Admitting: Family

## 2023-09-23 NOTE — Telephone Encounter (Signed)
Please request hospital records from most recent [facility] visit and schedule a hospital follow up visit.

## 2023-09-24 NOTE — Telephone Encounter (Signed)
Electronic request sent 

## 2023-09-25 ENCOUNTER — Telehealth: Payer: Self-pay | Admitting: Family

## 2023-09-25 ENCOUNTER — Other Ambulatory Visit (HOSPITAL_COMMUNITY): Payer: Self-pay

## 2023-09-25 ENCOUNTER — Encounter: Payer: Self-pay | Admitting: Cardiovascular Disease

## 2023-09-25 NOTE — Telephone Encounter (Signed)
Copied from CRM [id number]. Topic: Medicare AWV >> Sep 25, 2023 10:42 AM Payton Doughty wrote: Reason for CRM: Called LVM 09/25/2023 to schedule Annual Wellness Visit  Verlee Rossetti; Care Guide Ambulatory Clinical Support [facility] Direct Dial: [phone]

## 2023-10-01 ENCOUNTER — Telehealth: Payer: Self-pay | Admitting: Family

## 2023-10-01 NOTE — Telephone Encounter (Signed)
Prescription Request  10/01/2023  Is this a "Controlled Substance" medicine? No  LOV: 09/16/2023  What is the name of the medication or equipment?   diltiazem (CARDIZEM LA) 120 MG 24 hr tablet [[id number]]  DISCONTINUED  Rx #: [id number]  fluticasone (FLONASE) 50 MCG/ACT nasal spray [[id number]]  DISCONTINUED   Rx #: [id number]  furosemide (LASIX) 40 MG tablet [[id number]]   Rx #: [id number]  omeprazole (PRILOSEC) 40 MG capsule [[id number]]   Rx #: [id number]  spironolactone (ALDACTONE) 25 MG tablet [[id number]]   Rx #: [id number]  tamsulosin (FLOMAX) 0.4 MG CAPS capsule [[id number]]  DISCONTINUED    Have you contacted your pharmacy to request a refill? No   Which pharmacy would you like this sent to?   [facility] [address] [place], [place] [address] P: [phone] F: [phone]  Patient notified that their request is being sent to the clinical staff for review and that they should receive a response within 2 business days.   Please advise at Mobile [phone] (mobile)

## 2023-10-13 ENCOUNTER — Telehealth: Payer: Self-pay | Admitting: Family

## 2023-10-13 NOTE — Telephone Encounter (Signed)
Pt called back, states he needs to speak with Tammy as soon as possible.

## 2023-10-13 NOTE — Telephone Encounter (Signed)
Pt called and asked to speak with you regarding his medications. He stated he just wants a 1 on 1 to discuss how to manage all of them. He did not specify which one. Please call and advise.

## 2023-10-14 ENCOUNTER — Encounter: Payer: Self-pay | Admitting: Pharmacist

## 2023-10-14 ENCOUNTER — Ambulatory Visit (INDEPENDENT_AMBULATORY_CARE_PROVIDER_SITE_OTHER): Payer: Medicare Other | Admitting: Family

## 2023-10-14 ENCOUNTER — Other Ambulatory Visit (HOSPITAL_BASED_OUTPATIENT_CLINIC_OR_DEPARTMENT_OTHER): Payer: Self-pay

## 2023-10-14 ENCOUNTER — Telehealth: Payer: Self-pay | Admitting: Family

## 2023-10-14 ENCOUNTER — Telehealth: Payer: Self-pay

## 2023-10-14 ENCOUNTER — Other Ambulatory Visit: Payer: Self-pay | Admitting: Family

## 2023-10-14 VITALS — BP 135/69 | HR 73 | Temp 97.8°F | Resp 18 | Ht 67.0 in | Wt 174.0 lb

## 2023-10-14 DIAGNOSIS — R3915 Urgency of urination: Secondary | ICD-10-CM | POA: Insufficient documentation

## 2023-10-14 DIAGNOSIS — I5033 Acute on chronic diastolic (congestive) heart failure: Secondary | ICD-10-CM

## 2023-10-14 DIAGNOSIS — I4819 Other persistent atrial fibrillation: Secondary | ICD-10-CM

## 2023-10-14 LAB — POC URINALSYSI DIPSTICK (AUTOMATED)
Bilirubin, UA: NEGATIVE
Blood, UA: NEGATIVE
Glucose, UA: POSITIVE — AB
Ketones, UA: NEGATIVE
Leukocytes, UA: NEGATIVE
Nitrite, UA: NEGATIVE
Protein, UA: POSITIVE — AB
Spec Grav, UA: 1.025 (ref 1.010–1.025)
Urobilinogen, UA: 0.2 U/dL
pH, UA: 6 (ref 5.0–8.0)

## 2023-10-14 MED ORDER — OMEPRAZOLE 40 MG PO CPDR: 40.0000 mg | DELAYED_RELEASE_CAPSULE | Freq: Every day | ORAL | 0 refills | Status: DC

## 2023-10-14 MED ORDER — DILTIAZEM HCL ER COATED BEADS 120 MG PO CP24
120.0000 mg | ORAL_CAPSULE | Freq: Every day | ORAL | 1 refills | Status: DC
  Filled 2023-10-14: qty 90, 90d supply, fill #0
  Filled 2024-01-05: qty 90, 90d supply, fill #1

## 2023-10-14 MED ORDER — GLIPIZIDE ER 2.5 MG PO TB24
2.5000 mg | ORAL_TABLET | Freq: Every day | ORAL | 0 refills | Status: DC
  Filled 2023-10-14: qty 90, 90d supply, fill #0

## 2023-10-14 MED ORDER — SPIRONOLACTONE 25 MG PO TABS
25.0000 mg | ORAL_TABLET | Freq: Every day | ORAL | 1 refills | Status: DC
Start: 2023-10-14 — End: 2023-10-15

## 2023-10-14 MED ORDER — FUROSEMIDE 40 MG PO TABS: 40.0000 mg | ORAL_TABLET | Freq: Every day | ORAL | 0 refills | Status: DC

## 2023-10-14 NOTE — Telephone Encounter (Signed)
Returned patient's call. He states that Hospice has bee providing his medication.  Robert Bates states that he has not been feeling very well recently.

## 2023-10-14 NOTE — Progress Notes (Signed)
10/14/2023 Name: Robert Bates MRN: [id number] DOB: 03/25/1941  Chief Complaint  Patient presents with   Medication Management    Robert Bates is a 82 y.o. year old male who presented for a telephone visit.   They were referred to the pharmacist by their PCP for assistance in managing complex medication management.   Subjective: Patient called Clinical Pharmacist Practitioner today. He has been in the care of [facility] to start Palliative Care Program since October 2024. Today is states he is worried about his new medications and is concerned that they are making him feel worse.  Robert Bates as is not sure that he continues to need Hospice / Palliative care.  Patient was seen by his PCP today and she recommended he not take several medications per patient - she put them in a bag - patient and his sister in law read off medication bottles. The following was include in those meds he was told to stop - several are as needed meds.  Prednisone (given for back pain) took for 2 weeks. Today should be last day anyway. He will get steroid injection from ortho 12/23.  Chlorpromazine - as needed for nausea - usually only needs this when he take morphine Morphine  Vitamin C Benzonatate - as needed for cough Oxycodone - as needed for pain Gabapentin - no longer on his med list.    Medication Access/Adherence  Current Pharmacy:  [facility] [place] [facility] [address] [place] [address] Phone: [phone] Fax: [phone]  [facility] - [place], [place] - [address] [place] [address] Phone: [phone] Fax: [phone]  [facility] [address], [place] - [address] [place] [address] Phone: [phone] Fax: [phone]   Patient reports affordability concerns with their medications: No  - all meds are currently being covered thru Hospice.  Patient reports  access/transportation concerns to their pharmacy: No  Patient reports adherence concerns with their medications:  No        Objective:  Lab Results  Component Value Date   HGBA1C 7.2 (H) 09/16/2023    Lab Results  Component Value Date   CREATININE 1.05 08/14/2023   BUN 29 (H) 08/14/2023   NA 136 08/14/2023   K 4.6 08/14/2023   CL 96 08/14/2023   CO2 33 (H) 08/14/2023    Lab Results  Component Value Date   CHOL 142 08/07/2023   HDL 79 08/07/2023   LDLCALC 47 08/07/2023   LDLDIRECT 142.2 12/26/2008   TRIG 81 08/07/2023   CHOLHDL 1.8 08/07/2023    Medications Reviewed Today     Reviewed by [name], RPH-CPP (Pharmacist) on 10/14/23 at 1633  Med List Status: <None>   Medication Order Taking? Sig Documenting Provider Last Dose Status Informant  acetaminophen (TYLENOL) 500 MG tablet [id number] No Take 1-2 tablets (500-1,000 mg total) by mouth every 6 (six) hours as needed.  Patient taking differently: Take 1,000 mg by mouth daily as needed for headache, fever or moderate pain (pain score 4-6).   Ardelle Balls, PA-C Taking Active Self, Pharmacy Records           Med Note [facility], Glenna Durand   Wed Jul 16, 2023 11:25 AM)    Budeson-Glycopyrrol-Formoterol (BREZTRI AEROSPHERE) 160-9-4.8 MCG/ACT Sandrea Matte [id number]  Inhale 2 puffs into the lungs 2 (two) times daily. Sandford Craze, NP  Active   diltiazem (CARDIZEM CD)  120 MG 24 hr capsule [id number]  Take 1 capsule (120 mg total) by mouth daily. Sandford Craze, NP  Active   furosemide (LASIX) 40 MG tablet [id number]  Take 1 tablet (40 mg total) by mouth daily. Sandford Craze, NP  Active   glipiZIDE (GLUCOTROL XL) 2.5 MG 24 hr tablet [id number]  Take 1 tablet (2.5 mg total) by mouth daily with breakfast. Sandford Craze, NP  Active   levalbuterol Pauline Aus) 0.63 MG/3ML nebulizer solution [id number] No Take 3 mLs (0.63 mg total) by nebulization every 4 (four) hours as needed for wheezing or shortness of breath.  Glenford Bayley, NP Taking Active   LORazepam (ATIVAN) 0.5 MG tablet [id number] No Take 1 tablet (0.5 mg total) by mouth every 8 (eight) hours as needed for severe anxiety/shortness of breath not relieved by roxanol  Taking Active   morphine (ROXANOL) 20 MG/ML concentrated solution [id number] No Take 0.25 mLs (5 mg total) by mouth every 4 (four) hours as needed.  Taking Active   omeprazole (PRILOSEC) 40 MG capsule [id number]  Take 1 capsule (40 mg total) by mouth daily. Sandford Craze, NP  Active   potassium chloride (KLOR-CON) 10 MEQ tablet [id number] No Take 1 tablet (10 mEq total) by mouth daily. Tereso Newcomer T, PA-C Taking Active Self, Pharmacy Records  rivaroxaban (XARELTO) 20 MG TABS tablet [id number] No Take 1 tablet (20 mg total) by mouth every evening with supper. Joseph Art, DO Taking Active   senna (SENNA LAXATIVE) 8.6 MG tablet [id number] No Take 1-4 tablets (8.6-34.4 mg total) by mouth 2 (two) times daily as needed for constipation  Taking Active   spironolactone (ALDACTONE) 25 MG tablet [id number]  Take 1 tablet (25 mg total) by mouth daily. Sandford Craze, NP  Active   tamsulosin (FLOMAX) 0.4 MG CAPS capsule [id number]  Take 0.4 mg by mouth at bedtime. Sandford Craze, NP  Active               Assessment/Plan:   Medication Management: - Hospice is scheduled to visit tomorrow. Recommended patient share his medication list form Melissa O'Sullivan's visit today and follow her medication list  - Reviewed his medication list with him. Reminded patient the he can take pain medication just as needed if it makes him sleepy. He should also discuss with Hospice about uses lowest dose needed to relieve pain. - Patient should discuss continued Palliative / Hospice care with PCP.   Follow Up Plan: 2 to 3 weeks to review medications - patient requested in person visit.   [name], PharmD Clinical Pharmacist  Primary [facility]

## 2023-10-14 NOTE — Telephone Encounter (Signed)
Discussed medications and recent recommendations from PCP.

## 2023-10-14 NOTE — Progress Notes (Unsigned)
Subjective:     Patient ID: Robert Bates, male    DOB: 1941-06-05, 82 y.o.   MRN: [id number]  Chief Complaint  Patient presents with   Urinary Urgency    Complains of urinary urgency    HPI  Discussed the use of AI scribe software for clinical note transcription with the patient, who gave verbal consent to proceed.  History of Present Illness   The patient, with a history of multiple medical conditions presents with urinary symptoms. He reports difficulty urinating, describing an instance where he was only able to produce "half a teaspoon full." The patient also experiences a sensation of fullness, but is unsure if this is related to his bladder. He has tried increasing his fluid intake to alleviate these symptoms, with some success. This is not the first time the patient has experienced these symptoms, having previously been diagnosed with a yeast infection.  The patient also expresses frustration with his current medication regimen, which he feels is excessive and potentially contributing to his symptoms. He is especially not interested in the medications that have been provided to him from the palliative care team. The patient is currently not taking diltiazem, a medication previously prescribed for his AF nor his Amiodarone.  Both of which he was to continue at time of his most recent hospital discharge. He expresses confusion and frustration with the changes to his medication regimen and is unsure of the reasons for these changes.  In addition to these issues, the patient reports problems with his vision. He describes his eyelids as feeling heavy and his vision as becoming blurry, particularly when watching television. He is concerned that his drooping eyelids may be obstructing his vision.          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   COPD (chronic obstructive pulmonary disease) (HCC)    Coronary artery disease    COVID-19 virus infection 04/23/2021   Diabetes type 2, controlled (HCC) 07/31/2017   Diverticulitis 07/29/2023   Dyspnea    Dysrhythmia    a fib   GERD (gastroesophageal reflux disease)    Hematuria    refuses work up or referral - understands risks of morbidity / mortality - 11/2008, 12/2008   Heme positive stool    History of hiatal hernia    History of kidney stones    Hyperlipemia    Meningioma (HCC) 10/25/2013   Follows with Dr. Coletta Memos.    Peripheral vascular disease (HCC)    Abdominal Aortic Aneursym   Pneumonia    as a child   Radiation 09/18/15-10/25/15   left lower lobe 70.2 Gy   Seizures (HCC) 02/18/2020   Tobacco abuse     Past Surgical History:  Procedure Laterality Date   CHOLECYSTECTOMY N/A 07/23/2017   Procedure: LAPAROSCOPIC CHOLECYSTECTOMY;  Surgeon: Rodman Pickle, MD;  Location: [facility];  Service: General;  Laterality: N/A;   CLIPPING OF ATRIAL APPENDAGE Left 05/08/2021  Procedure: CLIPPING OF ATRIAL APPENDAGE USING 45 ATRICLIP;  Surgeon: Linden Dolin, MD;  Location: [facility] OR;  Service: Open Heart Surgery;  Laterality: Left;   COLONOSCOPY     CORONARY ARTERY BYPASS GRAFT N/A 05/08/2021   Procedure: CORONARY ARTERY BYPASS GRAFTING (CABG)X 3 USING LEFT INTERNAL MAMMARY ARTERY AND RIGHT GREATER SAPEHNOUS VEIN;  Surgeon: Linden Dolin, MD;  Location: [facility] OR;  Service: Open Heart Surgery;  Laterality: N/A;   ENDOVEIN HARVEST OF GREATER SAPHENOUS VEIN Right 05/08/2021   Procedure: ENDOVEIN HARVEST OF GREATER SAPHENOUS VEIN;   Surgeon: Linden Dolin, MD;  Location: [facility] OR;  Service: Open Heart Surgery;  Laterality: Right;   EYE SURGERY Bilateral    Cataracts removed w/ lens implant   HERNIA REPAIR     Left 36 years ago . Right inguinal hernia repair 10-01-17 Dr. Sheliah Hatch   INGUINAL HERNIA REPAIR Right 10/01/2017   Procedure: RIGHT INGUINAL HERNIA REPAIR WITH MESH;  Surgeon: Kinsinger, De Blanch, MD;  Location: [facility];  Service: General;  Laterality: Right;  TAP BLOCK   INSERTION OF MESH Right 10/01/2017   Procedure: INSERTION OF MESH;  Surgeon: Sheliah Hatch De Blanch, MD;  Location: [facility];  Service: General;  Laterality: Right;   IR THORACENTESIS ASP PLEURAL SPACE W/IMG GUIDE  05/18/2021   IR THORACENTESIS ASP PLEURAL SPACE W/IMG GUIDE  06/07/2021   LEFT HEART CATH AND CORONARY ANGIOGRAPHY N/A 04/20/2021   Procedure: LEFT HEART CATH AND CORONARY ANGIOGRAPHY;  Surgeon: Iran Ouch, MD;  [facility];  Service: Cardiovascular;  Laterality: N/A;   TEE WITHOUT CARDIOVERSION N/A 05/08/2021   Procedure: TRANSESOPHAGEAL ECHOCARDIOGRAM (TEE);  Surgeon: Linden Dolin, MD;  Location: [facility] OR;  Service: Open Heart Surgery;  Laterality: N/A;   TONSILLECTOMY     TONSILLECTOMY     VIDEO BRONCHOSCOPY Bilateral 07/26/2015   Procedure: VIDEO BRONCHOSCOPY WITH FLUORO;  Surgeon: Nyoka Cowden, MD;  [facility];  Service: Cardiopulmonary;  Laterality: Bilateral;   VIDEO BRONCHOSCOPY WITH ENDOBRONCHIAL NAVIGATION N/A 08/23/2015   Procedure: VIDEO BRONCHOSCOPY WITH ENDOBRONCHIAL NAVIGATION;  Surgeon: Delight Ovens, MD;  Location: [facility];  Service: Thoracic;  Laterality: N/A;   VIDEO BRONCHOSCOPY WITH ENDOBRONCHIAL ULTRASOUND N/A 08/23/2015   Procedure: VIDEO BRONCHOSCOPY WITH ENDOBRONCHIAL ULTRASOUND;  Surgeon: Delight Ovens, MD;  Location: [facility];  Service: Thoracic;  Laterality: N/A;    Family History  Problem Relation Age of Onset   Leukemia Father    Emphysema Father    Learning  disabilities Son    Atrial fibrillation Son    Leukemia Other    Stroke Other     Social History   Socioeconomic History   Marital status: Widowed    Spouse name: Not on file   Number of children: 2   Years of education: Not on file   Highest education level: Not on file  Occupational History   Occupation: Retired    Associate Professor: [employer]    Comment: truck Air traffic controller: [employer]  Tobacco Use   Smoking status: Former    Current packs/day: 0.00    Average packs/day: 1 pack/day for 57.0 years (57.0 ttl pk-yrs)    Types: Cigarettes, Cigars    Start date: 08/07/1958    Quit date: 08/08/2015    Years since quitting: 8.1   Smokeless tobacco: Former    Types: Chew    Quit date: 11/04/1958   Tobacco comments:    Will smoke cigar every once in awhile.  Vaping daily started a couple weeks ago.  04/18/22 hfb    Patient states he is still vaping. AB, CMA 09-10-2023  Vaping Use   Vaping status: Some Days  Substance and Sexual Activity   Alcohol use: Not Currently    Alcohol/week: 0.0 standard drinks of alcohol   Drug use: No   Sexual activity: Not Currently  Other Topics Concern   Not on file  Social History Narrative   Not on file   Social Drivers of Health   Financial Resource Strain: Low Risk  (05/22/2023)   Overall Financial Resource Strain (CARDIA)    Difficulty of Paying Living Expenses: Not very hard  Food Insecurity: No Food Insecurity (08/14/2023)   Hunger Vital Sign    Worried About Running Out of Food in the Last Year: Never true    Ran Out of Food in the Last Year: Never true  Transportation Needs: No Transportation Needs (08/14/2023)   PRAPARE - Administrator, Civil Service (Medical): No    Lack of Transportation (Non-Medical): No  Physical Activity: Insufficiently Active (08/05/2021)   Exercise Vital Sign    Days of Exercise per Week: 2 days    Minutes of Exercise per Session: 40 min  Stress: Stress Concern Present (05/22/2023)   [employer] of Occupational Health - Occupational Stress Questionnaire    Feeling of Stress : Rather much  Social Connections: Moderately Isolated (05/13/2022)   Social Connection and Isolation Panel [NHANES]    Frequency of Communication with Friends and Family: Twice a week    Frequency of Social Gatherings with Friends and Family: Once a week    Attends Religious Services: Never    Database administrator or Organizations: No    Attends Banker Meetings: Never    Marital Status: Married  Catering manager Violence: Not At Risk (08/04/2023)   Humiliation, Afraid, Rape, and Kick questionnaire    Fear of Current or Ex-Partner: No    Emotionally Abused: No    Physically Abused: No    Sexually Abused: No    Outpatient Medications Prior to Visit  Medication Sig Dispense Refill   tamsulosin (FLOMAX) 0.4 MG CAPS capsule Take 0.4 mg by mouth at bedtime.     acetaminophen (TYLENOL) 500 MG tablet Take 1-2 tablets (500-1,000 mg total) by mouth every 6 (six) hours as needed. (Patient taking differently: Take 1,000 mg by mouth daily as needed for headache, fever or moderate pain (pain score 4-6).) 30 tablet 0   Budeson-Glycopyrrol-Formoterol (BREZTRI AEROSPHERE) 160-9-4.8 MCG/ACT AERO Inhale 2 puffs into the lungs 2 (two) times daily.     furosemide (LASIX) 40 MG tablet Take 1 tablet (40 mg total) by mouth daily. 90 tablet 0   levalbuterol (XOPENEX) 0.63 MG/3ML nebulizer solution Take 3 mLs (0.63 mg total) by nebulization every 4 (four) hours as needed for wheezing or shortness of breath. 75 mL 12   LORazepam (ATIVAN) 0.5 MG tablet Take 1 tablet (0.5 mg total) by mouth every 8 (eight) hours as needed for severe anxiety/shortness of breath not relieved by roxanol 6 tablet 0   omeprazole (PRILOSEC) 40 MG capsule Take 1 capsule (40 mg total) by mouth daily. 90 capsule 0   potassium chloride (KLOR-CON) 10 MEQ tablet Take 1 tablet (10 mEq total) by mouth daily. 90 tablet 3   rivaroxaban (XARELTO)  20 MG TABS tablet Take 1 tablet (20 mg total) by mouth every evening with supper. 90 tablet 1   senna (SENNA LAXATIVE)  8.6 MG tablet Take 1-4 tablets (8.6-34.4 mg total) by mouth 2 (two) times daily as needed for constipation 100 tablet 0   glipiZIDE (GLUCOTROL XL) 2.5 MG 24 hr tablet Take 1 tablet (2.5 mg total) by mouth daily with breakfast. 90 tablet 0   morphine (ROXANOL) 20 MG/ML concentrated solution Take 0.25 mLs (5 mg total) by mouth every 4 (four) hours as needed. 30 mL 0   spironolactone (ALDACTONE) 25 MG tablet Take 1 tablet (25 mg total) by mouth daily. (Patient taking differently: Take 50 mg by mouth daily.) 90 tablet 1   No facility-administered medications prior to visit.    Allergies  Allergen Reactions   Iodine Swelling    Neck, gland swelling   Iohexol Swelling    Neck, gland swelling   Metformin And Related Diarrhea    ROS  See HP    Objective:    Physical Exam Constitutional:      General: He is not in acute distress.    Appearance: He is well-developed.  HENT:     Head: Normocephalic and atraumatic.  Cardiovascular:     Rate and Rhythm: Normal rate and regular rhythm.     Heart sounds: No murmur heard. Pulmonary:     Effort: Pulmonary effort is normal. No respiratory distress.     Breath sounds: Wheezing and rhonchi present. No rales.  Abdominal:     Comments: No suprapubic tenderness noted  Skin:    General: Skin is warm and dry.  Neurological:     Mental Status: He is alert and oriented to person, place, and time.  Psychiatric:        Behavior: Behavior normal.        Thought Content: Thought content normal.      BP 135/69 (BP Location: Right Arm, Patient Position: Sitting, Cuff Size: Normal)   Pulse 73   Temp 97.8 F (36.6 C) (Oral)   Resp 18   Ht 5\' 7"  (1.702 m)   Wt 174 lb (78.9 kg)   SpO2 98%   BMI 27.25 kg/m  Wt Readings from Last 3 Encounters:  10/14/23 174 lb (78.9 kg)  09/16/23 178 lb (80.7 kg)  09/10/23 176 lb 6.4 oz (80  kg)       Assessment & Plan:   Problem List Items Addressed This Visit       Unprioritized   Urinary urgency - Primary   UA does not suggest UTI.  He believes that he is emptying his bladder.  Will send urine for culture. He is on flomax. Advised pt to let us know right away if he does not feel like he is able to empty his bladder.       Relevant Orders   POCT Urinalysis Dipstick (Automated) (Completed)   Urine Culture (Completed)   30 minute spent on today's visit. The majority of the time today was spent sorting through patient's medicine bottles and performing med reconciliation/med teaching with patient.   I am having Robert Bates start on diltiazem. I am also having him maintain his acetaminophen, potassium chloride, rivaroxaban, LORazepam, senna, levalbuterol, Breztri Aerosphere, omeprazole, furosemide, and tamsulosin.  Meds ordered this encounter  Medications   diltiazem (CARDIZEM CD) 120 MG 24 hr capsule    Sig: Take 1 capsule (120 mg total) by mouth daily.    Dispense:  90 capsule    Refill:  1    Supervising Provider:   Danise Edge A [[id number]]

## 2023-10-14 NOTE — Telephone Encounter (Signed)
Dr Elease Hashimoto,  can you and your team please try to get him in for follow up?  It seems that he got confused and stopped his amiodarone and diltiazem following his hospitalization.  Heart rate looks good today. I am asking him to restart diltiazem. Will defer restart of amiodarone to you and your team. Thank you!

## 2023-10-14 NOTE — Assessment & Plan Note (Signed)
UA does not suggest UTI.  He believes that he is emptying his bladder.  Will send urine for culture. He is on flomax. Advised pt to let us know right away if he does not feel like he is able to empty his bladder.

## 2023-10-14 NOTE — Patient Instructions (Signed)
VISIT SUMMARY:  During today's visit, we discussed your urinary symptoms, medication management, and vision issues. We also reviewed your current medications and made some adjustments to better manage your health conditions.  YOUR PLAN:  -URINARY SYMPTOMS: You are experiencing difficulty urinating and a sensation of incomplete bladder emptying. We did not find signs of infection in your initial urinalysis, but we will send your urine for culture to confirm. If an infection is found, we will prescribe antibiotics.  -MEDICATION MANAGEMENT: You expressed confusion and frustration with your current medication regimen. We have restarted Diltiazem, which is important for controlling your heart rate due to your history of atrial fibrillation. We also removed unnecessary medications from your regimen.  -CARDIOLOGY FOLLOW-UP: You have not seen your cardiologist since your hospitalization in September. We have sent a note to Dr. Claire Shown to schedule an appointment for you. Restarting Diltiazem is crucial for managing your atrial fibrillation.  -EYE SYMPTOMS: You reported blurry vision and heavy eyelids, which may be due to your eyelids drooping over your pupils. We recommend that you follow up with your eye doctor to address these symptoms.  INSTRUCTIONS:  We will inform you of the urine culture results in 1-2 days and prescribe antibiotics if an infection is confirmed. Please schedule an appointment with your cardiologist, Dr. Elease Hashimoto, as soon as possible. Follow up with your eye doctor regarding your vision issues. We will meet again in three months to reassess your condition and medication regimen.

## 2023-10-14 NOTE — Telephone Encounter (Signed)
 Prescription Request  10/01/2023  Is this a "Controlled Substance" medicine? No  LOV: 09/16/2023  What is the name of the medication or equipment?   diltiazem (CARDIZEM LA) 120 MG 24 hr tablet [[id number]]  DISCONTINUED  Rx #: [id number]  fluticasone (FLONASE) 50 MCG/ACT nasal spray [[id number]]  DISCONTINUED   Rx #: [id number]  furosemide (LASIX) 40 MG tablet [[id number]]   Rx #: [id number]  omeprazole (PRILOSEC) 40 MG capsule [[id number]]   Rx #: [id number]  spironolactone (ALDACTONE) 25 MG tablet [[id number]]   Rx #: [id number]  tamsulosin (FLOMAX) 0.4 MG CAPS capsule [[id number]]  DISCONTINUED    Have you contacted your pharmacy to request a refill? No   Which pharmacy would you like this sent to?   [facility] [address] [place], [place] [address] P: [phone] F: [phone]  Patient notified that their request is being sent to the clinical staff for review and that they should receive a response within 2 business days.   Please advise at Mobile [phone] (mobile)

## 2023-10-15 ENCOUNTER — Other Ambulatory Visit (HOSPITAL_BASED_OUTPATIENT_CLINIC_OR_DEPARTMENT_OTHER): Payer: Self-pay

## 2023-10-15 ENCOUNTER — Telehealth: Payer: Self-pay | Admitting: Pharmacist

## 2023-10-15 LAB — URINE CULTURE
MICRO NUMBER:: 15831885
SPECIMEN QUALITY:: ADEQUATE

## 2023-10-15 MED ORDER — AMIODARONE HCL 200 MG PO TABS
200.0000 mg | ORAL_TABLET | Freq: Every day | ORAL | 3 refills | Status: AC
  Filled 2023-10-15 – 2024-03-15 (×3): qty 90, 90d supply, fill #0
  Filled 2024-08-06: qty 90, 90d supply, fill #1

## 2023-10-15 NOTE — Addendum Note (Signed)
Addended by: [name] B on: 10/15/2023 04:52 PM   Modules accepted: Orders

## 2023-10-15 NOTE — Telephone Encounter (Signed)
Spoke with Tajha Sammarco, RN with [facility]'s palliative care department.  Requested they send their most recent home visit note and medication list.  She will fax to [phone]

## 2023-10-15 NOTE — Telephone Encounter (Signed)
Nahser, Deloris Ping, MD sent to Sandford Craze, NP; Lars Mage, RN Caller: Unspecified (Yesterday,  2:15 PM) Thanks Efraim Kaufmann, We will go ahead and start him on amiodarone 200 mg a day . I'm glad that his HR is well controled at this point We will have him see an APP in several months Thanks [employer] and spoke with patient. Verified that he did restart the Diltiazem discussed during his visit with PCP yesterday. Verbalized understanding to restart Amio 200mg  daily. Sent to [facility] pharmacy per his request. Has f/u w/ her in march already, so input recall for [place] for a visit in May.

## 2023-10-15 NOTE — Telephone Encounter (Signed)
Received medication list from Hospice.  Medications that are on their list but not ours:  Oxycodone 5mg  Prochlorperazine 10mg   Pregabalin 25mg   Also they had that patient is taking spironolactone 50mg  instead of 25mg . From last note on their visit it looks like he has had a lot of LEE.   Will check with PCP about above meds and if patient should hold these meds or restart.

## 2023-10-15 NOTE — Patient Outreach (Signed)
  Care Coordination   Case Closure  Visit Note   10/15/2023 Name: Robert Bates MRN: [id number] DOB: 1941/04/11  Robert Bates is a 82 y.o. year old male who sees Sandford Craze, NP for primary care. No patient contact was made during this encounter.  Documentation Encounter only. Per review of chart, patient is active with [facility]. RNCM contacted [facility] [phone]) and confirmed patient is active with [facility]. RNCM will close out of case.   Goals Addressed             This Visit's Progress    COMPLETED: continue to improve post hospitalization       Interventions Today    Flowsheet Row Most Recent Value  General Interventions   General Interventions Discussed/Reviewed General Interventions Reviewed  [Case Closed-patient active with Hospice]  Advanced Directive Interventions   [facility] Life [facility]  [Confirmed active with Hospice]            SDOH assessments and interventions completed:  No  Care Coordination Interventions:  No, not indicated   Follow up plan: No further intervention required.   Encounter Outcome:  Patient Visit Completed   Kathyrn Sheriff, RN, MSN, BSN, CCM Care Management Coordinator [phone]

## 2023-10-16 NOTE — Assessment & Plan Note (Signed)
Rate stable.  Restart diltiazem, will reach out to pt's cardiologist to see if they can bring him in for follow up and to see if they would like him to restart amiodarone as well.  He is maintained on xarelto.

## 2023-10-16 NOTE — Telephone Encounter (Signed)
His weight was down 4 pounds at his visit this  week.  I think he should continue the aldactone 50 mg.   In regards to the meds from Hospice.  I don't believe he is having any pain or nausea so I think he can set aside those medications for as needed use only. Thanks.

## 2023-10-20 ENCOUNTER — Telehealth: Payer: Self-pay | Admitting: Family

## 2023-10-20 DIAGNOSIS — H26491 Other secondary cataract, right eye: Secondary | ICD-10-CM | POA: Diagnosis not present

## 2023-10-20 DIAGNOSIS — H43811 Vitreous degeneration, right eye: Secondary | ICD-10-CM | POA: Diagnosis not present

## 2023-10-20 DIAGNOSIS — H53483 Generalized contraction of visual field, bilateral: Secondary | ICD-10-CM | POA: Diagnosis not present

## 2023-10-20 DIAGNOSIS — H04123 Dry eye syndrome of bilateral lacrimal glands: Secondary | ICD-10-CM | POA: Diagnosis not present

## 2023-10-20 NOTE — Telephone Encounter (Signed)
[facility] called (pt present at their office) requesting notes of tumor diagnosis/care plan/treatment and most recent visit. Pt left disc at home but they could not read the disc so they are requesting actual notes to be sent. Records request in s drive

## 2023-10-20 NOTE — Telephone Encounter (Signed)
MRI report faxed to [facility]

## 2023-10-22 NOTE — Telephone Encounter (Signed)
Left message for patient on his VM regarding medication recommendation from PCP. He should only take oxycodone if he has having pain and prochlorperazine if he is having nausea.   He has take pregabalin for nerve pain / tingling. Monitor for increase in LEE and make sure to take spironolactone as prescribed every day.   Patient has face to face follow up with Clinical Pharmacist Practitioner in 2 to 3 weeks.

## 2023-10-27 ENCOUNTER — Telehealth: Payer: Self-pay

## 2023-10-27 ENCOUNTER — Other Ambulatory Visit (HOSPITAL_BASED_OUTPATIENT_CLINIC_OR_DEPARTMENT_OTHER): Payer: Self-pay

## 2023-10-27 ENCOUNTER — Other Ambulatory Visit: Payer: Self-pay

## 2023-10-27 ENCOUNTER — Other Ambulatory Visit: Payer: Self-pay | Admitting: Family

## 2023-10-27 DIAGNOSIS — M48062 Spinal stenosis, lumbar region with neurogenic claudication: Secondary | ICD-10-CM | POA: Diagnosis not present

## 2023-10-27 DIAGNOSIS — E1142 Type 2 diabetes mellitus with diabetic polyneuropathy: Secondary | ICD-10-CM

## 2023-10-27 NOTE — Telephone Encounter (Signed)
Copied from CRM [id number]. Topic: Clinical - Medication Question >> Oct 27, 2023  3:30 PM Joanette Gula wrote: Patient would like to speak with Windell Moulding , please call regarding his nerve medication. [phone]

## 2023-10-28 ENCOUNTER — Other Ambulatory Visit: Payer: Self-pay

## 2023-10-28 ENCOUNTER — Other Ambulatory Visit: Payer: Self-pay | Admitting: Family

## 2023-10-28 ENCOUNTER — Other Ambulatory Visit (HOSPITAL_BASED_OUTPATIENT_CLINIC_OR_DEPARTMENT_OTHER): Payer: Self-pay

## 2023-10-28 DIAGNOSIS — I5033 Acute on chronic diastolic (congestive) heart failure: Secondary | ICD-10-CM

## 2023-10-28 MED ORDER — TAMSULOSIN HCL 0.4 MG PO CAPS
0.4000 mg | ORAL_CAPSULE | Freq: Every day | ORAL | 1 refills | Status: DC
  Filled 2023-10-28: qty 90, 90d supply, fill #0
  Filled 2024-02-18: qty 90, 90d supply, fill #1

## 2023-10-31 ENCOUNTER — Other Ambulatory Visit (HOSPITAL_BASED_OUTPATIENT_CLINIC_OR_DEPARTMENT_OTHER): Payer: Self-pay

## 2023-11-03 ENCOUNTER — Other Ambulatory Visit (HOSPITAL_COMMUNITY): Payer: Self-pay

## 2023-11-04 ENCOUNTER — Other Ambulatory Visit (HOSPITAL_BASED_OUTPATIENT_CLINIC_OR_DEPARTMENT_OTHER): Payer: Self-pay

## 2023-11-04 ENCOUNTER — Other Ambulatory Visit: Payer: Self-pay | Admitting: Family

## 2023-11-05 ENCOUNTER — Other Ambulatory Visit (HOSPITAL_BASED_OUTPATIENT_CLINIC_OR_DEPARTMENT_OTHER): Payer: Self-pay

## 2023-11-05 MED ORDER — OMEPRAZOLE 40 MG PO CPDR
40.0000 mg | DELAYED_RELEASE_CAPSULE | Freq: Every day | ORAL | 0 refills | Status: DC
  Filled 2023-11-05: qty 90, 90d supply, fill #0

## 2023-11-10 ENCOUNTER — Other Ambulatory Visit (HOSPITAL_BASED_OUTPATIENT_CLINIC_OR_DEPARTMENT_OTHER): Payer: Self-pay

## 2023-11-10 ENCOUNTER — Ambulatory Visit (INDEPENDENT_AMBULATORY_CARE_PROVIDER_SITE_OTHER): Admitting: Pharmacist

## 2023-11-10 DIAGNOSIS — Z79899 Other long term (current) drug therapy: Secondary | ICD-10-CM | POA: Diagnosis not present

## 2023-11-10 DIAGNOSIS — I4819 Other persistent atrial fibrillation: Secondary | ICD-10-CM | POA: Diagnosis not present

## 2023-11-10 NOTE — Progress Notes (Signed)
 11/10/2023 Name: Robert Bates MRN: [id number] DOB: 1941/11/02  Chief Complaint  Patient presents with   Medication Management    Robert Bates is a 83 y.o. year old male who presented for an in-person visit today   They were referred to the pharmacist by their PCP for assistance in managing complex medication management.   Subjective: Patient has been in the care of [facility] to start Palliative Care Program since October 2024. Patient reports that a nurse visits once weekly, usually on Wednesdays. Hospice is also assisting with some of his medication costs but patient has to fill medications at [facility]. He would prefer to get at [facility] but it is likely that Hospice has a contract with [facility].   Patent report that he is not sure what all his medications are for, especially the newer medication prescribed by Hospice. He states his niece is helping with medication administration. Mr. Ospina was supposed to bring in all his medications bottles today to review but he states he forgot.   He report that he has pain but he rarely takes the pain medication because it makes him nauseated and he has to take prochlorperazine  / Compazine . Mr. Manson states he has taken a pain medication in the past that was prescribed by his PCP that helped with pain and did not cause nausea. It looks like that was tramadol  50mg .   Patient reports affordability concerns with their medications: No  - all meds are currently being covered thru [facility]osp[facility].  He reports he did receive Xarelto  x 1 delivery from medication assistance program in December.   Patient reports access/transportation concerns to their pharmacy: No  Patient reports adherence concerns with their medications:  No     Patient also states he needs refill for lorazepam . This is prescribed by Hospice / Palliative Care and looks like is was last refilled 11/04/2023 for 10 day supply or #30 tabs, so it would be too early to  refill. Patient is not sure if he picked up lorazepam  or not. Was filled at [facility].   He will be due refill for amiodarone  soon - current Rx is at [facility].     Objective:  Lab Results  Component Value Date   HGBA1C 7.2 (H) 09/16/2023    Lab Results  Component Value Date   CREATININE 1.05 08/14/2023   BUN 29 (H) 08/14/2023   NA 136 08/14/2023   K 4.6 08/14/2023   CL 96 08/14/2023   CO2 33 (H) 08/14/2023    Lab Results  Component Value Date   CHOL 142 08/07/2023   HDL 79 08/07/2023   LDLCALC 47 08/07/2023   LDLDIRECT 142.2 12/26/2008   TRIG 81 08/07/2023   CHOLHDL 1.8 08/07/2023    Medications Reviewed Today     Reviewed by Carla Milling, RPH-CPP (Pharmacist) on 11/14/23 at 0934  Med List Status: <None>   Medication Order Taking? Sig Documenting Provider Last Dose Status Informant  acetaminophen  (TYLENOL ) 500 MG tablet [id number]  Take 1-2 tablets (500-1,000 mg total) by mouth every 6 (six) hours as needed.  Patient taking differently: Take 1,000 mg by mouth daily as needed for headache, fever or moderate pain (pain score 4-6).   Dwan Kyla HERO, PA-C  Active Self, Pharmacy Records           Med Note [facility] [place] [facility]   Wed Jul 16, 2023 11:25 AM)    amiodarone  (PACERONE ) 200 MG tablet [id number]  Take 1 tablet (200 mg total) by  mouth daily. Nahser, Aleene PARAS, MD  Active   Budeson-Glycopyrrol-Formoterol  (BREZTRI  AEROSPHERE) 160-9-4.8 MCG/ACT TERESE [id number]  Inhale 2 puffs into the lungs 2 (two) times daily. Daryl Setter, NP  Active   diltiazem  (CARDIZEM  CD) 120 MG 24 hr capsule [id number]  Take 1 capsule (120 mg total) by mouth daily. Daryl Setter, NP  Active   furosemide  (LASIX ) 40 MG tablet [id number]  Take 1 tablet (40 mg total) by mouth daily. Daryl Setter, NP  Active   glipiZIDE  (GLUCOTROL  XL) 2.5 MG 24 hr tablet [id number]  Take 1 tablet (2.5 mg total) by mouth daily with breakfast. Daryl Setter, NP  Active   levalbuterol  (XOPENEX )  0.63 MG/3ML nebulizer solution [id number]  Take 3 mLs (0.63 mg total) by nebulization every 4 (four) hours as needed for wheezing or shortness of breath. Hope Almarie ORN, NP  Active   LORazepam  (ATIVAN ) 0.5 MG tablet [id number]  Take 1 tablet (0.5 mg total) by mouth every 8 (eight) hours as needed for severe anxiety/shortness of breath not relieved by roxanol   Active   omeprazole  (PRILOSEC) 40 MG capsule [id number]  Take 1 capsule (40 mg total) by mouth daily. Douglass Caul B, FNP  Active   oxyCODONE  (OXY IR/ROXICODONE ) 5 MG immediate release tablet [id number]  Take 5 mg by mouth every 4 (four) hours as needed. [provider]  Active   potassium chloride  (KLOR-CON ) 10 MEQ tablet [id number]  Take 1 tablet (10 mEq total) by mouth daily. Lelon Hamilton T, PA-C  Active Self, Pharmacy Records  prochlorperazine  (COMPAZINE ) 10 MG tablet [id number] Yes Take 10 mg by mouth every 6 (six) hours as needed. [provider]  Active   rivaroxaban  (XARELTO ) 20 MG TABS tablet [id number]  Take 1 tablet (20 mg total) by mouth every evening with supper. Vann, Jessica U, DO  Active   senna (SENNA LAXATIVE) 8.6 MG tablet [id number]  Take 1-4 tablets (8.6-34.4 mg total) by mouth 2 (two) times daily as needed for constipation   Active   spironolactone  (ALDACTONE ) 50 MG tablet [id number]  Take 50 mg by mouth daily. [provider]  Active   tamsulosin  (FLOMAX ) 0.4 MG CAPS capsule [id number]  Take 1 capsule (0.4 mg total) by mouth at bedtime. Daryl Setter, NP  Active               Assessment/Plan:   Medication Management: - Reviewed his medication list with him. I also provided a list from Hospice of medications at patient request (making sure he understood that this list was from December and it is noted that some of these medication had been discontinued). I provided patient with the most recent Epic medication list and included medication indications.  - Verified that Hospice is  contracted with [facility] and can only prescribe 14 day supply of medications per fill. Explained to patient.  - Coordinated with [facility] to have amiodarone  refilled. Also checked and per [facility] patient picked up lorazepam  Rx 11/08/2023. He will check for this Rx at home.  - Spoke with Hospice nurse about patient's pain medication. She will have his provider and nurse reassess pain and possibly change to medication patient can tolerate better like tramadol .   Follow Up Plan: 4 to 6 weeks - unless needed sooner.   Madelin Ray, PharmD Clinical Pharmacist [facility]

## 2023-11-25 ENCOUNTER — Telehealth: Payer: Self-pay

## 2023-11-25 DIAGNOSIS — R2681 Unsteadiness on feet: Secondary | ICD-10-CM

## 2023-11-25 NOTE — Telephone Encounter (Signed)
Copied from CRM [id number]. Topic: General - Other >> Nov 25, 2023  8:10 AM Kathryne Eriksson wrote: Reason for CRM: Patient Requesting Call Back >> Nov 25, 2023  8:12 AM Kathryne Eriksson wrote: Patient is requesting a call back from Windell Moulding, wants to speak with her about some issues he's experiencing. Call back number [phone]

## 2023-11-25 NOTE — Telephone Encounter (Signed)
Patient notified of referral and to contact his neurologist for follow up.

## 2023-11-25 NOTE — Addendum Note (Signed)
Addended by: Sandford Craze on: 11/25/2023 10:32 AM   Modules accepted: Orders

## 2023-11-25 NOTE — Telephone Encounter (Signed)
I would recommend physical therapy referral which I will place, and that he follow back up with his neurosurgeon at [facility] due to falls.

## 2023-11-27 ENCOUNTER — Telehealth: Payer: Self-pay | Admitting: Family

## 2023-11-27 NOTE — Telephone Encounter (Signed)
Copied from CRM [id number]. Topic: Referral - Question >> Nov 27, 2023 11:39 AM Drema Balzarine wrote: Reason for CRM: Patient niece request a call back from [facility] nurse regarding physical therapy referral - says patient is in hospice care

## 2023-11-28 ENCOUNTER — Telehealth: Payer: Self-pay | Admitting: Family

## 2023-11-28 ENCOUNTER — Telehealth: Payer: Self-pay

## 2023-11-28 NOTE — Telephone Encounter (Signed)
Spoke to patient and he reports he called hospice and told "them not to come see him anymore" He complains of his niece trying to manipulate his care. I advised him to keep  appointment 12/19/23 to discuss his plan of care with pcp in reference to recent falls. Patient advised his niece should come with him to appointment.   Copied from CRM [id number]. Topic: General - Other >> Nov 28, 2023  2:12 PM Almira Coaster wrote: Reason for CRM: Patient called and would like to speak to Windell Moulding, did not want to disclose what it was about but states it's important.

## 2023-11-28 NOTE — Telephone Encounter (Signed)
Copied from CRM [id number]. Topic: General - Other >> Nov 28, 2023  2:12 PM Almira Coaster wrote: Reason for CRM: Patient called and would like to speak to Windell Moulding, did not want to disclose what it was about but states it's important.

## 2023-11-28 NOTE — Telephone Encounter (Signed)
We can hold off for now and I will discuss with patient at his follow up visit.

## 2023-11-28 NOTE — Progress Notes (Signed)
 Care Guide Pharmacy Note  11/28/2023 Name: Robert Bates MRN: [id number] DOB: [birth year]  Referred By: Sandford Craze, NP Reason for referral: Care Coordination (Outreach to schedule with Pharm d )   Robert Bates is a 83 y.o. year old male who is a primary care patient of Sandford Craze, NP.  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 Additional attempts will be made to contact the patient.  Penne Lash , RMA     [facility]  [facility] Guide  Direct Dial: [phone]  Website: [email or web address]

## 2023-11-28 NOTE — Telephone Encounter (Signed)
Copied from CRM [id number]. Topic: Appointments - Scheduling Inquiry for Clinic >> Nov 28, 2023  8:26 AM Deaijah H wrote: Reason for CRM: Patient called in due to Side effects possibly from medications and would like to speak with Tammy Eckard to get scheduled/ please call [phone]

## 2023-12-01 ENCOUNTER — Telehealth: Payer: Medicare Other

## 2023-12-01 ENCOUNTER — Telehealth: Payer: Self-pay | Admitting: Pharmacist

## 2023-12-01 DIAGNOSIS — R296 Repeated falls: Secondary | ICD-10-CM | POA: Diagnosis not present

## 2023-12-01 DIAGNOSIS — M5489 Other dorsalgia: Secondary | ICD-10-CM | POA: Diagnosis not present

## 2023-12-01 DIAGNOSIS — M6281 Muscle weakness (generalized): Secondary | ICD-10-CM | POA: Diagnosis not present

## 2023-12-01 DIAGNOSIS — R293 Abnormal posture: Secondary | ICD-10-CM | POA: Diagnosis not present

## 2023-12-01 NOTE — Telephone Encounter (Signed)
Attempt was made to contact patient by phone today by Clinical Pharmacist regarding medication questions. Patient has requested appointment with Clinical Pharmacist Practitioner last week.  Unable to reach patient. LM on VM with my contact number [phone] or [phone]

## 2023-12-10 ENCOUNTER — Other Ambulatory Visit: Payer: Self-pay | Admitting: Family

## 2023-12-10 ENCOUNTER — Telehealth: Payer: Self-pay | Admitting: Pharmacist

## 2023-12-10 NOTE — Telephone Encounter (Signed)
 Patient called stating that he would like to stop taking medications from [facility]osp[facility]. He states that his niece is also helping by filling pill container. He reports there is something that he is taking that is making him feel sick. I reminded patient that he does not have to take pain medication every day - just as needed.  Moved appointment from 02/19  to 12/15/2023. Patient will bring in all his medication and pill box with him so we can review. He is to continue current medications until I see him 12/15/2023.

## 2023-12-11 DIAGNOSIS — R296 Repeated falls: Secondary | ICD-10-CM | POA: Diagnosis not present

## 2023-12-11 DIAGNOSIS — M5489 Other dorsalgia: Secondary | ICD-10-CM | POA: Diagnosis not present

## 2023-12-11 DIAGNOSIS — R293 Abnormal posture: Secondary | ICD-10-CM | POA: Diagnosis not present

## 2023-12-11 DIAGNOSIS — M6281 Muscle weakness (generalized): Secondary | ICD-10-CM | POA: Diagnosis not present

## 2023-12-15 ENCOUNTER — Ambulatory Visit: Payer: Medicare Other | Admitting: Internal Medicine

## 2023-12-15 ENCOUNTER — Telehealth: Payer: Self-pay

## 2023-12-15 ENCOUNTER — Ambulatory Visit: Payer: Medicare Other

## 2023-12-15 ENCOUNTER — Telehealth: Payer: Self-pay | Admitting: Family

## 2023-12-15 NOTE — Telephone Encounter (Signed)
 [employer] notified we do not have any documentation to support portable oxygen . Will contact patient and bring in for evaluation if he thinks he needs oxygen . Last ox level recorded is at 98%

## 2023-12-15 NOTE — Telephone Encounter (Signed)
 Copied from CRM [id number]. Topic: General - Other >> Dec 15, 2023 11:30 AM Albertha Alosa wrote: Reason for CRM: B from [facility] care called regarding the Portable Oxygen  Machine stated that prior [employer] is not required but what is required clinical documentation

## 2023-12-15 NOTE — Telephone Encounter (Signed)
 Patient has appointment to come in for evaluation 12/19/2023

## 2023-12-15 NOTE — Telephone Encounter (Signed)
 [employer] called about they're client requesting a portable oxygen  machine and need someone to call back [phone] to let her know the process on the pts request.  Please advise, Thanks

## 2023-12-16 ENCOUNTER — Telehealth: Payer: Self-pay

## 2023-12-16 NOTE — Telephone Encounter (Signed)
Copied from CRM [id number]. Topic: Appointments - Appointment Cancel/Reschedule >> Dec 15, 2023  2:30 PM Robert Bates wrote: Patient/patient representative is calling to cancel or reschedule an appointment. Refer to attachments for appointment information.  He needs to reschedule his Pharmacist appointment for Medication follow up. Please call him at [phone]

## 2023-12-17 ENCOUNTER — Telehealth: Payer: Self-pay

## 2023-12-17 NOTE — Telephone Encounter (Signed)
Spoke with patient. Rescheduled medication review appointment / in person for 12/24/2023 at 2:45pm. He will see ortho later that day at 3:30

## 2023-12-17 NOTE — Telephone Encounter (Signed)
Please reschedule pt. Pt is scheduled to see Waynetta Sandy, NP tomorrow at 8:30am. Pt needs to be rescheduled either Thursday afternoon or Friday. Okay to double book per Moore.

## 2023-12-17 NOTE — Telephone Encounter (Signed)
Patient has been rescheduled to 2/14 at 8:30am.

## 2023-12-18 ENCOUNTER — Ambulatory Visit: Payer: Medicare Other | Admitting: Primary Care

## 2023-12-19 ENCOUNTER — Ambulatory Visit: Payer: Medicare Other | Admitting: Family

## 2023-12-19 ENCOUNTER — Telehealth: Payer: Self-pay | Admitting: Family

## 2023-12-19 ENCOUNTER — Encounter: Payer: Self-pay | Admitting: Primary Care

## 2023-12-19 ENCOUNTER — Ambulatory Visit: Payer: Medicare Other | Admitting: Primary Care

## 2023-12-19 VITALS — BP 129/85 | HR 76 | Temp 98.2°F | Ht 67.0 in | Wt 180.8 lb

## 2023-12-19 DIAGNOSIS — J439 Emphysema, unspecified: Secondary | ICD-10-CM

## 2023-12-19 DIAGNOSIS — J4489 Other specified chronic obstructive pulmonary disease: Secondary | ICD-10-CM | POA: Diagnosis not present

## 2023-12-19 DIAGNOSIS — R0602 Shortness of breath: Secondary | ICD-10-CM

## 2023-12-19 NOTE — Progress Notes (Signed)
@Patient  ID: Robert Bates, male    DOB: Jul 01, 1941, 83 y.o.   MRN: [id number]  Chief Complaint  Patient presents with   Follow-up    Referring provider: Sandford Craze, NP  HPI: 83 year old, former smoker PMH significant for CAD s/p CABG, afib, CHF, HTN, COPD, pleural effusion, lung cancer, type 2 diabetes, HLD, seizures.   12/19/2023 Discussed the use of AI scribe software for clinical note transcription with the patient, who gave verbal consent to proceed.  History of Present Illness   Robert Bates is an 83 year old male with COPD, CHF, and AFib who presents for follow-up of his respiratory and cardiac conditions.  He has chronic obstructive pulmonary disease (COPD) and uses the Breztri inhaler twice daily. His breathing has remained stable since the fall, though he experiences persistent chest tightness, especially in the mornings. Shortness of breath occurs during activities like showering, necessitating sitting down to dress. He prefers using a rescue inhaler over a nebulized machine and has access to oxygen tanks and a machine. He wants a portable oxygen concentrator for travel.  He has a history of congestive heart failure (CHF) and atrial fibrillation (AFib), managed with Xarelto and amiodarone. He also takes Lasix (furosemide) as a diuretic to manage fluid retention. He was hospitalized in September and October for heart-related issues and continues to follow up with his cardiologist.  He has a history of lung cancer with stable chronic scarring and a density in the left lung, consistent with prior radiation treatment. He is scheduled for a CT scan in the spring for follow-up.  No recent episodes of hemoptysis, unexpected weight loss, or choking episodes. He occasionally coughs up gray or milky white mucus, but not in large amounts.      Allergies  Allergen Reactions   Iodine Swelling    Neck, gland swelling   Iohexol Swelling    Neck, gland swelling   Metformin And  Related Diarrhea    Immunization History  Administered Date(s) Administered   Fluad Quad(high Dose 65+) 07/23/2019, 09/04/2020, 07/13/2021, 08/16/2022   Fluad Trivalent(High Dose 65+) 07/22/2023   Influenza Split 08/02/2011, 08/18/2012   Influenza Whole 08/17/2008   Influenza, High Dose Seasonal PF 08/09/2015, 09/30/2016, 07/30/2017, 08/07/2018   Influenza,inj,Quad PF,6+ Mos 07/27/2013, 08/09/2014   PFIZER Comirnaty(Gray Top)Covid-19 Tri-Sucrose Vaccine 02/16/2021   PFIZER(Purple Top)SARS-COV-2 Vaccination 01/28/2020, 02/21/2020, 09/04/2020   Pfizer Covid-19 Vaccine Bivalent Booster 56yrs & up 08/03/2021   Pneumococcal Conjugate-13 08/09/2014   Pneumococcal Polysaccharide-23 05/14/2010   Td 07/25/2008    Past Medical History:  Diagnosis Date   Anxiety    Aortic dilatation (HCC) 05/13/2022   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   COPD (chronic obstructive pulmonary disease) (HCC)    Coronary artery disease    COVID-19 virus infection 04/23/2021   Diabetes type 2, controlled (HCC) 07/31/2017   Diverticulitis 07/29/2023   Dyspnea    Dysrhythmia    a fib   GERD (gastroesophageal reflux disease)    Hematuria    refuses work up or referral - understands risks of morbidity / mortality - 11/2008, 12/2008   Heme positive stool    History of hiatal hernia    History of kidney stones    Hyperlipemia    Meningioma (HCC) 10/25/2013   Follows with Dr. Coletta Memos.  Peripheral vascular disease (HCC)    Abdominal Aortic Aneursym   Pneumonia    as a child   Radiation 09/18/15-10/25/15   left lower lobe 70.2 Gy   Seizures (HCC) 02/18/2020   Tobacco abuse     Tobacco History: Social History   Tobacco Use  Smoking Status Former   Current packs/day: 0.00   Average packs/day: 1 pack/day for 57.0 years  (57.0 ttl pk-yrs)   Types: Cigarettes, Cigars   Start date: 08/07/1958   Quit date: 08/08/2015   Years since quitting: 8.3  Smokeless Tobacco Former   Types: Chew   Quit date: 11/04/1958  Tobacco Comments   Will smoke cigar every once in awhile.  Vaping daily started a couple weeks ago.  04/18/22 hfb   Patient states he is still vaping. AB, CMA 09-10-2023   Counseling given: Not Answered Tobacco comments: Will smoke cigar every once in awhile.  Vaping daily started a couple weeks ago.  04/18/22 hfb Patient states he is still vaping. AB, CMA 09-10-2023   Outpatient Medications Prior to Visit  Medication Sig Dispense Refill   acetaminophen (TYLENOL) 500 MG tablet Take 1-2 tablets (500-1,000 mg total) by mouth every 6 (six) hours as needed. (Patient taking differently: Take 1,000 mg by mouth daily as needed for headache, fever or moderate pain (pain score 4-6).) 30 tablet 0   amiodarone (PACERONE) 200 MG tablet Take 1 tablet (200 mg total) by mouth daily. 90 tablet 3   Budeson-Glycopyrrol-Formoterol (BREZTRI AEROSPHERE) 160-9-4.8 MCG/ACT AERO Inhale 2 puffs into the lungs 2 (two) times daily.     diltiazem (CARDIZEM CD) 120 MG 24 hr capsule Take 1 capsule (120 mg total) by mouth daily. 90 capsule 1   furosemide (LASIX) 40 MG tablet TAKE 1 TABLET BY MOUTH DAILY 90 tablet 0   glipiZIDE (GLUCOTROL XL) 2.5 MG 24 hr tablet Take 1 tablet (2.5 mg total) by mouth daily with breakfast. 90 tablet 0   levalbuterol (XOPENEX) 0.63 MG/3ML nebulizer solution Take 3 mLs (0.63 mg total) by nebulization every 4 (four) hours as needed for wheezing or shortness of breath. 75 mL 12   LORazepam (ATIVAN) 0.5 MG tablet Take 1 tablet (0.5 mg total) by mouth every 8 (eight) hours as needed for severe anxiety/shortness of breath not relieved by roxanol 6 tablet 0   omeprazole (PRILOSEC) 40 MG capsule Take 1 capsule (40 mg total) by mouth daily. 90 capsule 0   oxyCODONE (OXY IR/ROXICODONE) 5 MG immediate release tablet Take  5 mg by mouth every 4 (four) hours as needed.     potassium chloride (KLOR-CON) 10 MEQ tablet Take 1 tablet (10 mEq total) by mouth daily. 90 tablet 3   pregabalin (LYRICA) 50 MG capsule Take 50 mg by mouth 2 (two) times daily.     prochlorperazine (COMPAZINE) 10 MG tablet Take 10 mg by mouth every 6 (six) hours as needed.     rivaroxaban (XARELTO) 20 MG TABS tablet Take 1 tablet (20 mg total) by mouth every evening with supper. 90 tablet 1   senna (SENNA LAXATIVE) 8.6 MG tablet Take 1-4 tablets (8.6-34.4 mg total) by mouth 2 (two) times daily as needed for constipation 100 tablet 0   spironolactone (ALDACTONE) 50 MG tablet Take 50 mg by mouth daily.     tamsulosin (FLOMAX) 0.4 MG CAPS capsule Take 1 capsule (0.4 mg total) by mouth at bedtime. 90 capsule 1   No facility-administered medications prior to visit.    Review of Systems  Review  of Systems  Respiratory:  Positive for cough and chest tightness.        Dyspnea with exertion    Physical Exam  BP 129/85 (BP Location: Left Arm, Patient Position: Sitting, Cuff Size: Large)   Pulse 76   Temp 98.2 F (36.8 C) (Temporal)   Ht 5\' 7"  (1.702 m)   Wt 180 lb 12.8 oz (82 kg)   SpO2 97%   BMI 28.32 kg/m  Physical Exam Constitutional:      Appearance: Normal appearance.  Cardiovascular:     Rate and Rhythm: Normal rate and regular rhythm.  Pulmonary:     Effort: Pulmonary effort is normal.     Breath sounds: No wheezing or rhonchi.  Skin:    General: Skin is warm and dry.  Neurological:     General: No focal deficit present.     Mental Status: He is alert and oriented to person, place, and time. Mental status is at baseline.  Psychiatric:        Mood and Affect: Mood normal.        Behavior: Behavior normal.        Thought Content: Thought content normal.        Judgment: Judgment normal.      Lab Results:  CBC    Component Value Date/Time   WBC 14.0 (H) 08/14/2023 1203   RBC 4.94 08/14/2023 1203   HGB 13.7  08/14/2023 1203   HGB 11.2 (L) 06/18/2023 1125   HGB 14.7 06/17/2017 1315   HGB 14.8 08/17/2015 1318   HCT 42.6 08/14/2023 1203   HCT 43.8 06/17/2017 1315   HCT 44.4 08/17/2015 1318   PLT 230.0 08/14/2023 1203   PLT 268 06/18/2023 1125   PLT 230 06/17/2017 1315   PLT 276 08/17/2015 1318   MCV 86.2 08/14/2023 1203   MCV 93 06/17/2017 1315   MCV 91.9 08/17/2015 1318   MCH 27.0 08/09/2023 0532   MCHC 32.1 08/14/2023 1203   RDW 18.3 (H) 08/14/2023 1203   RDW 13.5 06/17/2017 1315   RDW 13.3 08/17/2015 1318   LYMPHSABS 1.1 08/14/2023 1203   LYMPHSABS 1.2 06/17/2017 1315   LYMPHSABS 2.6 08/17/2015 1318   MONOABS 1.4 (H) 08/14/2023 1203   MONOABS 0.8 08/17/2015 1318   EOSABS 0.0 08/14/2023 1203   EOSABS 0.2 06/17/2017 1315   BASOSABS 0.0 08/14/2023 1203   BASOSABS 0.0 06/17/2017 1315   BASOSABS 0.1 08/17/2015 1318    BMET    Component Value Date/Time   NA 136 08/14/2023 1203   NA 141 04/02/2023 1123   NA 140 06/17/2017 1315   K 4.6 08/14/2023 1203   K 4.5 06/17/2017 1315   CL 96 08/14/2023 1203   CO2 33 (H) 08/14/2023 1203   CO2 28 06/17/2017 1315   GLUCOSE 104 (H) 08/14/2023 1203   GLUCOSE 95 06/17/2017 1315   BUN 29 (H) 08/14/2023 1203   BUN 16 04/02/2023 1123   BUN 18.1 06/17/2017 1315   CREATININE 1.05 08/14/2023 1203   CREATININE 1.00 06/18/2023 1125   CREATININE 0.84 01/17/2023 1552   CREATININE 0.8 06/17/2017 1315   CALCIUM 9.3 08/14/2023 1203   CALCIUM 9.8 06/17/2017 1315   GFRNONAA >60 08/09/2023 0532   GFRNONAA >60 06/18/2023 1125   GFRAA >60 06/20/2020 1405    BNP    Component Value Date/Time   BNP 134.1 (H) 08/04/2023 1328   BNP 63 07/25/2023 1540    ProBNP    Component Value Date/Time   PROBNP 83.0  08/14/2023 1203    Imaging: No results found.   Assessment & Plan:   1. COPD with chronic bronchitis and emphysema (HCC) (Primary) - AMB REFERRAL FOR DME  2. Shortness of breath - AMB REFERRAL FOR DME     Chronic Obstructive Pulmonary  Disease (COPD) Persistent chest tightness, dyspnea with exertion, and occasional productive cough. No acute exacerbation. Prefers inhaler over nebulizer. -Continue Breztri inhaler two puffs q12 hours as directed. -Use Albuterol as needed for symptom relief.  Congestive Heart Failure (CHF) and Atrial Fibrillation (AFib) Stable, managed with Xarelto, Amiodarone and furoseide. No new cardiac symptoms reported. -Continue follow-up with cardiologist.  Abdominal Obesity Expressed desire for weight loss, particularly in the abdominal region. BMI 28. -Discuss potential weight loss strategies with primary care provider.  Lung Cancer Stable scarring and density in left lung consistent with radiation. No hemoptysis or unexpected weight loss. -Continue follow-up with Dr. Arbutus Ped. -Plan for CT scan in the spring.  Oxygen Therapy Currently has home oxygen but desires a portable unit for travel. Patient was walked in office on POC and was able to maintain O2 >97% on 2L pulsed  -Initiate process for obtaining a portable oxygen concentrator.   Glenford Bayley, NP 12/19/2023

## 2023-12-19 NOTE — Patient Instructions (Addendum)
Continue Breztri Aerosphere two puffs morning and evening  Continue Albuterol 2 puffs every 4-6 hours for breakthrough shortness of breath  Continue Lasix as directed  Take prednisone taper as directed for wheezing Monitor leg swelling, if weight/swelling increases contact cardiology  Qualify for POC Follow-up 3 months with Dr. Celine Mans or sooner

## 2023-12-19 NOTE — Telephone Encounter (Signed)
Pt asked if appt could be earlier on the 19th since he sees ortho at 3:30 that day. If appt can be changed pt asked to be called so he knows.

## 2023-12-22 ENCOUNTER — Other Ambulatory Visit (HOSPITAL_BASED_OUTPATIENT_CLINIC_OR_DEPARTMENT_OTHER): Payer: Self-pay

## 2023-12-22 ENCOUNTER — Ambulatory Visit (INDEPENDENT_AMBULATORY_CARE_PROVIDER_SITE_OTHER): Admitting: Pharmacist

## 2023-12-22 VITALS — BP 120/68 | HR 67

## 2023-12-22 DIAGNOSIS — I4819 Other persistent atrial fibrillation: Secondary | ICD-10-CM

## 2023-12-22 DIAGNOSIS — E119 Type 2 diabetes mellitus without complications: Secondary | ICD-10-CM | POA: Diagnosis not present

## 2023-12-22 DIAGNOSIS — Z7984 Long term (current) use of oral hypoglycemic drugs: Secondary | ICD-10-CM | POA: Diagnosis not present

## 2023-12-22 DIAGNOSIS — Z79899 Other long term (current) drug therapy: Secondary | ICD-10-CM

## 2023-12-22 NOTE — Telephone Encounter (Signed)
Patient requested appt to be moved to Monday 2/17 due to anticipated snow Wed 2/19

## 2023-12-22 NOTE — Progress Notes (Signed)
11/10/2023 Name: Robert Bates MRN: [id number] DOB: [birth year]  No chief complaint on file.   Robert Bates is a 83 y.o. year old male who presented for an in-person visit today   They were referred to the pharmacist by their PCP for assistance in managing complex medication management.   Subjective: Patient has been in the care of [facility] since October 2024. Patient reports that a nurse visits once weekly, usually on Wednesdays. [facility]ospice is also assisting with some of his medication costs but patient has to fill medications at [facility]. He continues to states that he would prefer to not use [facility] to get his medications. He is under the impression that [facility]ospice is recommended he take medications that are not on his medication list or that his PCP prefers that he not taking.   He brings in all his medications today. He has a box of packaged medications from [facility]. This contains only 3 of his maintenance medications - spironolactone 25mg  each morning, furosemide 40mg  each evening and potassium 10 mEq each evening.   He also has bottles of medications from [facility] (prescribed by Hospice / Palliative Care): amiodarone, furosemide, lorazepam, oxycodone 5mg , potassium 10 mEq, pregabalin, prochlorperazine, spironolactone  He has bottles of medications from [facility]: glipizide, omeprazole, diltiazem and tamsulosin.   He would prefer that his PCP prescribed his "nerve medication". He would like to take the yellow capsules (gabapentin 300mg ) instead of the pregabalin that is prescribed by [facility]osp[facility].   He also states that he has been taking oxycodone 5mg  as needed for back pain even though he has to also take prochlorperazine / Compazine because oxycodone makes him nauseated.  He mostly experiences back pain in the morning and "burning pain" when he is getting in and out of his car.  He reported at our last visit that he took tramadol in the  past and was able to tolerate this well. I called [facility]osp[facility] and left a message about possibly changing to tramadol after our last appointment.   Patient reports affordability concerns with their medications: No  - all meds filled thru Hospice are $0.  He reports he did receive Xarelto x 1 delivery from medication assistance program in December and he has 3 bottles of Xarelto with 30 tablets at home.   Patient reports access/transportation concerns to their pharmacy: No  Patient reports adherence concerns with their medications:  No   .    Patient mentioned that from time to time he gets hot / flushed. He thinks his blood glucose might be  low but he forgot how to use his glucometer and has not been checking blood glucose.   Objective:  Lab Results  Component Value Date   HGBA1C 7.2 (H) 09/16/2023    Lab Results  Component Value Date   CREATININE 1.05 08/14/2023   BUN 29 (H) 08/14/2023   NA 136 08/14/2023   K 4.6 08/14/2023   CL 96 08/14/2023   CO2 33 (H) 08/14/2023    Lab Results  Component Value Date   CHOL 142 08/07/2023   HDL 79 08/07/2023   LDLCALC 47 08/07/2023   LDLDIRECT 142.2 12/26/2008   TRIG 81 08/07/2023   CHOLHDL 1.8 08/07/2023    Medications Reviewed Today     Reviewed by [name], RPH-CPP (Pharmacist) on 12/22/23 at 1642  Med List Status: <None>   Medication Order Taking? Sig Documenting Provider Last Dose Status Informant  acetaminophen (TYLENOL) 500 MG tablet [id number]  Take 1-2 tablets (500-1,000 mg total) by mouth every 6 (six) hours as needed.  Patient taking differently: Take 1,000 mg by mouth daily as needed for headache, fever or moderate pain (pain score 4-6).   Ardelle Balls, PA-C  Active Self, Pharmacy Records           Med Note [facility] [place] [facility]   Wed Jul 16, 2023 11:25 AM)    amiodarone (PACERONE) 200 MG tablet [id number] Yes Take 1 tablet (200 mg total) by mouth daily. Nahser, Deloris Ping, MD Taking Active    Budeson-Glycopyrrol-Formoterol (BREZTRI AEROSPHERE) 160-9-4.8 MCG/ACT Sandrea Matte [id number] Yes Inhale 2 puffs into the lungs 2 (two) times daily. Sandford Craze, NP Taking Active   diltiazem (CARDIZEM CD) 120 MG 24 hr capsule [id number] Yes Take 1 capsule (120 mg total) by mouth daily. Sandford Craze, NP Taking Active   furosemide (LASIX) 40 MG tablet [id number] Yes TAKE 1 TABLET BY MOUTH DAILY McElwee, Lauren A, NP Taking Active   glipiZIDE (GLUCOTROL XL) 2.5 MG 24 hr tablet [id number] Yes Take 1 tablet (2.5 mg total) by mouth daily with breakfast. Sandford Craze, NP Taking Active   levalbuterol Pauline Aus) 0.63 MG/3ML nebulizer solution [id number] Yes Take 3 mLs (0.63 mg total) by nebulization every 4 (four) hours as needed for wheezing or shortness of breath. Glenford Bayley, NP Taking Active   LORazepam (ATIVAN) 0.5 MG tablet [id number] Yes Take 1 tablet (0.5 mg total) by mouth every 8 (eight) hours as needed for severe anxiety/shortness of breath not relieved by roxanol  Taking Active   omeprazole (PRILOSEC) 40 MG capsule [id number] Yes Take 1 capsule (40 mg total) by mouth daily. Worthy Rancher B, FNP Taking Active   oxyCODONE (OXY IR/ROXICODONE) 5 MG immediate release tablet [id number] Yes Take 5 mg by mouth every 4 (four) hours as needed. [provider] Taking Active   potassium chloride (KLOR-CON) 10 MEQ tablet [id number] Yes Take 1 tablet (10 mEq total) by mouth daily. Tereso Newcomer T, PA-C Taking Active Self, Pharmacy Records  pregabalin (LYRICA) 50 MG capsule [id number] Yes Take 50 mg by mouth 2 (two) times daily. [provider] Taking Active   prochlorperazine (COMPAZINE) 10 MG tablet [id number] Yes Take 10 mg by mouth every 6 (six) hours as needed. [provider] Taking Active   rivaroxaban (XARELTO) 20 MG TABS tablet [id number] Yes Take 1 tablet (20 mg total) by mouth every evening with supper. Joseph Art, DO Taking Active   senna (SENNA LAXATIVE) 8.6 MG  tablet [id number]  Take 1-4 tablets (8.6-34.4 mg total) by mouth 2 (two) times daily as needed for constipation   Active   spironolactone (ALDACTONE) 50 MG tablet [id number] Yes Take 50 mg by mouth daily. [provider] Taking Active   tamsulosin (FLOMAX) 0.4 MG CAPS capsule [id number] Yes Take 1 capsule (0.4 mg total) by mouth at bedtime. Sandford Craze, NP Taking Active               Assessment/Plan:   Medication Management: - Reviewed his medication list with him. Wrote on his list the reason for taking each medication. Also provided a list with when to take each medication:  Morning: spironolactone, amiodarone, glipizide, omeprazole, pregabalin Afternoon: furosemide (patient preference to take in afternoon) Evening Meal: diltiazem, potassium, pregabalin, Xarelto, tamsulosin  As needed medications: lorazepam, oxycodone, prochlorperazine  - Recommended patient hold off on using packaged medications from [facility] as it only has 3 of his medications. I am worried that he will accidentally take  meds from packages and bottles. I called [facility] and asked that they hold off on filling / delivering any more medications. (This month they filled furosemide, spironolactone and potassium. [facility] / [facility] also filled those medications).  - Will discuss with PCP patients wishes to stop [facility]osp[facility] / Palliative care. This is a decision patient and PCP are to agree on. He would like PCP to take over prescribing all his medications and he would like to get them from [facility] again. In the future [facility] could use adherence packaging if patient prefers.   - Will let PCP know that patient would like to change from pregabalin 50mg  bid back to gabapentin 300mg  up to 3 times a day. He would also like to retry tramadol. Patient is aware that he might need to move up his currently appointment with PCP.    - Reviewed how to use glucometer and blood glucose goals. Also reviewed How to Treat a  Low Glucose Level (see below) patient to contact office if blood glucose < 80.  If you have a low blood glucose less than 70, please eat / drink 15 grams of carbohydrates (4 oz of juice, soda, 4 glucose tablets, or 3-4 pieces of hard candy).  It is best so choose a "quick" source of sugar that does no contain fat (chocolate and peanut butter might take longer to increase your blood glucose) Wait 15 minutes and then recheck your blood glucose. If your blood glucose is still less than 70, eat another 15 grams of carbohydrates.  Wait another 15 minutes and recheck your glucose.  Continue this until your blood glucose is over 70. Once you blood glucose is over 70, eat a snack with protein in it to prevent your blood glucose from dropping again.   Follow Up Plan: 4 to 6 weeks - unless needed sooner.   [name], PharmD Clinical Pharmacist [facility]

## 2023-12-24 ENCOUNTER — Ambulatory Visit: Payer: Medicare Other

## 2023-12-24 ENCOUNTER — Telehealth: Payer: Medicare Other

## 2023-12-24 ENCOUNTER — Ambulatory Visit: Payer: Medicare Other | Admitting: Physical Therapy

## 2023-12-29 ENCOUNTER — Other Ambulatory Visit (HOSPITAL_BASED_OUTPATIENT_CLINIC_OR_DEPARTMENT_OTHER): Payer: Self-pay

## 2023-12-29 ENCOUNTER — Other Ambulatory Visit: Payer: Self-pay | Admitting: Internal Medicine

## 2023-12-29 ENCOUNTER — Telehealth: Payer: Self-pay | Admitting: *Deleted

## 2023-12-29 MED ORDER — PREDNISONE 50 MG PO TABS
ORAL_TABLET | ORAL | 0 refills | Status: DC
  Filled 2023-12-29: qty 3, 1d supply, fill #0

## 2023-12-29 NOTE — Telephone Encounter (Signed)
 Patient notified rx was sent in for this

## 2023-12-29 NOTE — Telephone Encounter (Signed)
 Please advise him that I am going to forward his request to Dr. Gwenyth Bouillon since he is the provider ordering the CT.

## 2023-12-29 NOTE — Telephone Encounter (Signed)
 Communication  Reason for CRM: Patient called in to ask that Robert Bates call in a prescription that [facility] told him to get before his CT on 01/26/24. He is allergic to the contrast and is supposed to have a medication that he takes before he gets the contras

## 2024-01-05 ENCOUNTER — Other Ambulatory Visit (HOSPITAL_BASED_OUTPATIENT_CLINIC_OR_DEPARTMENT_OTHER): Payer: Self-pay

## 2024-01-05 ENCOUNTER — Ambulatory Visit (INDEPENDENT_AMBULATORY_CARE_PROVIDER_SITE_OTHER): Payer: Medicare Other | Admitting: Pharmacist

## 2024-01-05 DIAGNOSIS — Z79899 Other long term (current) drug therapy: Secondary | ICD-10-CM

## 2024-01-05 NOTE — Progress Notes (Signed)
 11/10/2023 Name: Robert Bates MRN: [id number] DOB: 1941/06/25  Chief Complaint  Patient presents with   Medication Management    Robert Bates is a 83 y.o. year old male who presented for a telephone visit today   They were referred to the pharmacist by their PCP for assistance in managing complex medication management.   Subjective: Patient has been in the care of [facility] since October 2024. Patient reports that a nurse visits once weekly, usually on Wednesdays. He reports today that Palliative will continue for 2 more week.  About 2 weeks ago we met in person to review his medications and create an administration plan. Robert Bates reports medication administration has improved and he feel more confident that he is taking medications correctly.     He is a little worried that his O2 supplementation at home will stop if Palliative care stopps.   He also states that he has been taking acetaminophen instead of oxycodone 5mg  recently. He has taken only 1 oxycodone in the lats week.   Patient reports affordability concerns with their medications: No  - all meds filled thru Hospice are $0.  He reports he has 3 bottles of Xarelto with 30 tablets at home.   Patient reports access/transportation concerns to their pharmacy: No  Patient reports adherence concerns with their medications:  No   .     Objective:  Lab Results  Component Value Date   HGBA1C 7.2 (H) 09/16/2023    Lab Results  Component Value Date   CREATININE 1.05 08/14/2023   BUN 29 (H) 08/14/2023   NA 136 08/14/2023   K 4.6 08/14/2023   CL 96 08/14/2023   CO2 33 (H) 08/14/2023    Lab Results  Component Value Date   CHOL 142 08/07/2023   HDL 79 08/07/2023   LDLCALC 47 08/07/2023   LDLDIRECT 142.2 12/26/2008   TRIG 81 08/07/2023   CHOLHDL 1.8 08/07/2023    Medications Reviewed Today     Reviewed by [name], RPH-CPP (Pharmacist) on 01/05/24 at 1045  Med List  Status: <None>   Medication Order Taking? Sig Documenting Provider Last Dose Status Informant  acetaminophen (TYLENOL) 500 MG tablet [id number] Yes Take 1-2 tablets (500-1,000 mg total) by mouth every 6 (six) hours as needed.  Patient taking differently: Take 1,000 mg by mouth daily as needed for headache, fever or moderate pain (pain score 4-6).   Ardelle Balls, PA-C Taking Active Self, Pharmacy Records           Med Note [facility], [place] [facility]   Wed Jul 16, 2023 11:25 AM)    amiodarone (PACERONE) 200 MG tablet [id number] Yes Take 1 tablet (200 mg total) by mouth daily. Nahser, Deloris Ping, MD Taking Active   Budeson-Glycopyrrol-Formoterol (BREZTRI AEROSPHERE) 160-9-4.8 MCG/ACT Sandrea Matte [id number] No Inhale 2 puffs into the lungs 2 (two) times daily.  Patient not taking: Reported on 01/05/2024   Sandford Craze, NP Not Taking Active   diltiazem (CARDIZEM CD) 120 MG 24 hr capsule [id number] Yes Take 1 capsule (120 mg total) by mouth daily. Sandford Craze, NP Taking Active   furosemide (LASIX) 40 MG tablet [id number] Yes TAKE 1 TABLET BY MOUTH DAILY McElwee, Lauren A, NP Taking Active   glipiZIDE (GLUCOTROL XL) 2.5 MG 24 hr tablet [id number] Yes Take 1 tablet (2.5 mg total) by mouth daily with breakfast. Sandford Craze, NP Taking Active   levalbuterol Pauline Aus) 0.63 MG/3ML nebulizer solution [id number] No Take 3 mLs (0.63  mg total) by nebulization every 4 (four) hours as needed for wheezing or shortness of breath.  Patient not taking: Reported on 01/05/2024   Glenford Bayley, NP Not Taking Active   LORazepam (ATIVAN) 0.5 MG tablet [id number] Yes Take 1 tablet (0.5 mg total) by mouth every 8 (eight) hours as needed for severe anxiety/shortness of breath not relieved by roxanol  Taking Active   omeprazole (PRILOSEC) 40 MG capsule [id number] Yes Take 1 capsule (40 mg total) by mouth daily. Worthy Rancher B, FNP Taking Active   oxyCODONE (OXY IR/ROXICODONE) 5 MG immediate release tablet [id number]  Take  5 mg by mouth every 4 (four) hours as needed. [provider]  Active   potassium chloride (KLOR-CON) 10 MEQ tablet [id number] Yes Take 1 tablet (10 mEq total) by mouth daily. Tereso Newcomer T, PA-C Taking Active Self, Pharmacy Records  predniSONE (DELTASONE) 50 MG tablet [id number] No Take 1 tablet by mouth 14 hours then 7 hours then 2 hours before the scan. Please make sure to take 2 tablet Benadryl over-the-counter 25 mg with the last dose of prednisone 2 hours before the scan  Patient not taking: Reported on 01/05/2024   Si Gaul, MD Not Taking Active   pregabalin (LYRICA) 50 MG capsule [id number] Yes Take 50 mg by mouth 2 (two) times daily. [provider] Taking Active   prochlorperazine (COMPAZINE) 10 MG tablet [id number] Yes Take 10 mg by mouth every 6 (six) hours as needed. [provider] Taking Active   rivaroxaban (XARELTO) 20 MG TABS tablet [id number] Yes Take 1 tablet (20 mg total) by mouth every evening with supper. Joseph Art, DO Taking Active   senna (SENNA LAXATIVE) 8.6 MG tablet [id number]  Take 1-4 tablets (8.6-34.4 mg total) by mouth 2 (two) times daily as needed for constipation   Active   spironolactone (ALDACTONE) 50 MG tablet [id number] Yes Take 50 mg by mouth daily. [provider] Taking Active   tamsulosin (FLOMAX) 0.4 MG CAPS capsule [id number] Yes Take 1 capsule (0.4 mg total) by mouth at bedtime. Sandford Craze, NP Taking Active               Assessment/Plan:   Medication Management: - Reviewed his medication list with him. Also reviewed refill history. He is due ot have diltiazem refill soon.  Morning: spironolactone, amiodarone, glipizide, omeprazole, pregabalin Afternoon: furosemide (patient preference to take in afternoon) Evening Meal: diltiazem, potassium, pregabalin, Xarelto, tamsulosin  As needed medications: lorazepam, oxycodone, prochlorperazine  - Recommended patient discuss O2 needs with PCP at  upcoming appointement 01/20/2024  - Coordinated with pharmacy to fill diltiazem   Follow Up Plan: 4 to 6 weeks - unless needed sooner.   [name], PharmD Clinical Pharmacist [facility]

## 2024-01-06 ENCOUNTER — Ambulatory Visit: Payer: Medicare Other | Admitting: Physical Therapy

## 2024-01-12 ENCOUNTER — Telehealth: Payer: Self-pay | Admitting: Internal Medicine

## 2024-01-12 NOTE — Telephone Encounter (Signed)
 Patient would like a change in his inhaler since it causes too many side effects(he's read about it).

## 2024-01-13 NOTE — Telephone Encounter (Signed)
 Ok to stop the inhaler and monitor symptoms. If the symptoms do not resolve within a week of stopping the inhaler, unlikely to be a medication side effect.

## 2024-01-13 NOTE — Telephone Encounter (Signed)
 Called and spoke with patient, advised of recommendations per Dr. Celine Mans.  He verbalized understanding.  Nothing further needed.

## 2024-01-13 NOTE — Telephone Encounter (Signed)
 Called and spoke with patient, he wants to change the Breztri inhaler due to side effects.  He says he is having muscle weakness and back pain that he feels is coming from the inhaler.  He states he has been on it for about a year.  He just noticed the muscle weakness and back pain for about a week.  He states he had an injection back in the summer and then again a month ago and the injections are not doing any good.  He did say that he was told that he has arthritis and maybe that is what is causing the pain.  I advised him on the most common side effects of the inhaler and let him know I would bet a message to Dr. Celine Mans and once I hear back from her I would call him back with her recommendations.  Dr. Celine Mans, Please advise regarding changing inhaler due to muscle weakness and back pain patient feels is coming from using [facility] inhaler.  Thank you.

## 2024-01-20 ENCOUNTER — Ambulatory Visit: Admitting: Physical Therapy

## 2024-01-20 ENCOUNTER — Ambulatory Visit: Payer: Medicare Other | Admitting: Family

## 2024-01-21 DIAGNOSIS — F17290 Nicotine dependence, other tobacco product, uncomplicated: Secondary | ICD-10-CM | POA: Diagnosis not present

## 2024-01-21 DIAGNOSIS — K449 Diaphragmatic hernia without obstruction or gangrene: Secondary | ICD-10-CM | POA: Diagnosis not present

## 2024-01-21 DIAGNOSIS — S3992XA Unspecified injury of lower back, initial encounter: Secondary | ICD-10-CM | POA: Diagnosis not present

## 2024-01-21 DIAGNOSIS — I700 Atherosclerosis of aorta: Secondary | ICD-10-CM | POA: Diagnosis not present

## 2024-01-21 DIAGNOSIS — J90 Pleural effusion, not elsewhere classified: Secondary | ICD-10-CM | POA: Diagnosis not present

## 2024-01-21 DIAGNOSIS — Z7901 Long term (current) use of anticoagulants: Secondary | ICD-10-CM | POA: Diagnosis not present

## 2024-01-21 DIAGNOSIS — R110 Nausea: Secondary | ICD-10-CM | POA: Diagnosis not present

## 2024-01-21 DIAGNOSIS — S300XXA Contusion of lower back and pelvis, initial encounter: Secondary | ICD-10-CM | POA: Diagnosis not present

## 2024-01-21 DIAGNOSIS — S199XXA Unspecified injury of neck, initial encounter: Secondary | ICD-10-CM | POA: Diagnosis not present

## 2024-01-21 DIAGNOSIS — C719 Malignant neoplasm of brain, unspecified: Secondary | ICD-10-CM | POA: Diagnosis not present

## 2024-01-21 DIAGNOSIS — J439 Emphysema, unspecified: Secondary | ICD-10-CM | POA: Diagnosis not present

## 2024-01-21 DIAGNOSIS — I4891 Unspecified atrial fibrillation: Secondary | ICD-10-CM | POA: Diagnosis not present

## 2024-01-21 DIAGNOSIS — M5134 Other intervertebral disc degeneration, thoracic region: Secondary | ICD-10-CM | POA: Diagnosis not present

## 2024-01-21 DIAGNOSIS — M47812 Spondylosis without myelopathy or radiculopathy, cervical region: Secondary | ICD-10-CM | POA: Diagnosis not present

## 2024-01-21 DIAGNOSIS — I719 Aortic aneurysm of unspecified site, without rupture: Secondary | ICD-10-CM | POA: Diagnosis not present

## 2024-01-21 DIAGNOSIS — S299XXA Unspecified injury of thorax, initial encounter: Secondary | ICD-10-CM | POA: Diagnosis not present

## 2024-01-21 DIAGNOSIS — M549 Dorsalgia, unspecified: Secondary | ICD-10-CM | POA: Diagnosis not present

## 2024-01-21 DIAGNOSIS — S3993XA Unspecified injury of pelvis, initial encounter: Secondary | ICD-10-CM | POA: Diagnosis not present

## 2024-01-21 DIAGNOSIS — S0990XA Unspecified injury of head, initial encounter: Secondary | ICD-10-CM | POA: Diagnosis not present

## 2024-01-21 DIAGNOSIS — M50322 Other cervical disc degeneration at C5-C6 level: Secondary | ICD-10-CM | POA: Diagnosis not present

## 2024-01-21 DIAGNOSIS — M898X8 Other specified disorders of bone, other site: Secondary | ICD-10-CM | POA: Diagnosis not present

## 2024-01-21 DIAGNOSIS — M51369 Other intervertebral disc degeneration, lumbar region without mention of lumbar back pain or lower extremity pain: Secondary | ICD-10-CM | POA: Diagnosis not present

## 2024-01-21 DIAGNOSIS — G936 Cerebral edema: Secondary | ICD-10-CM | POA: Diagnosis not present

## 2024-01-21 DIAGNOSIS — D320 Benign neoplasm of cerebral meninges: Secondary | ICD-10-CM | POA: Diagnosis not present

## 2024-01-21 DIAGNOSIS — W19XXXA Unspecified fall, initial encounter: Secondary | ICD-10-CM | POA: Diagnosis not present

## 2024-01-21 DIAGNOSIS — N210 Calculus in bladder: Secondary | ICD-10-CM | POA: Diagnosis not present

## 2024-01-21 DIAGNOSIS — N200 Calculus of kidney: Secondary | ICD-10-CM | POA: Diagnosis not present

## 2024-01-21 DIAGNOSIS — C3490 Malignant neoplasm of unspecified part of unspecified bronchus or lung: Secondary | ICD-10-CM | POA: Diagnosis not present

## 2024-01-21 DIAGNOSIS — R220 Localized swelling, mass and lump, head: Secondary | ICD-10-CM | POA: Diagnosis not present

## 2024-01-21 DIAGNOSIS — S3991XA Unspecified injury of abdomen, initial encounter: Secondary | ICD-10-CM | POA: Diagnosis not present

## 2024-01-22 ENCOUNTER — Telehealth: Payer: Self-pay

## 2024-01-22 NOTE — Transitions of Care (Post Inpatient/ED Visit) (Signed)
 01/22/2024  Name: Jaxn Chiquito MRN: [id number] DOB: Oct 18, 1941  Today's TOC FU Call Status: Today's TOC FU Call Status:: Successful TOC FU Call Completed TOC FU Call Complete Date: 01/22/24 Patient's Name and Date of Birth confirmed.  Transition Care Management Follow-up Telephone Call Date of Discharge: 01/21/24 Discharge Facility: Other [facility]) Name of Other (Non[facility]) Discharge Facility: [facility] Type of Discharge: Emergency Department Reason for ED Visit: Other: (fall) How have you been since you were released from the hospital?: Better Any questions or concerns?: No  Items Reviewed: Did you receive and understand the discharge instructions provided?: Yes Medications obtained,verified, and reconciled?: Yes (Medications Reviewed) Any new allergies since your discharge?: No Dietary orders reviewed?: NA Do you have support at home?: Yes People in Home: child(ren), adult  Medications Reviewed Today: Medications Reviewed Today     Reviewed by Karena Addison, LPN (Licensed Practical Nurse) on 01/22/24 at 1318  Med List Status: <None>   Medication Order Taking? Sig Documenting Provider Last Dose Status Informant  acetaminophen (TYLENOL) 500 MG tablet [id number] No Take 1-2 tablets (500-1,000 mg total) by mouth every 6 (six) hours as needed.  Patient taking differently: Take 1,000 mg by mouth daily as needed for headache, fever or moderate pain (pain score 4-6).   Ardelle Balls, PA-C Taking Active Self, Pharmacy Records           Med Note [facility], [place] [facility]   Wed Jul 16, 2023 11:25 AM)    amiodarone (PACERONE) 200 MG tablet [id number] No Take 1 tablet (200 mg total) by mouth daily. Nahser, Deloris Ping, MD Taking Active   Budeson-Glycopyrrol-Formoterol (BREZTRI AEROSPHERE) 160-9-4.8 MCG/ACT Sandrea Matte [id number] No Inhale 2 puffs into the lungs 2 (two) times daily.  Patient not taking: Reported on 01/05/2024   Sandford Craze, NP Not Taking Active   diltiazem (CARDIZEM CD)  120 MG 24 hr capsule [id number] No Take 1 capsule (120 mg total) by mouth daily. Sandford Craze, NP Taking Active   furosemide (LASIX) 40 MG tablet [id number] No TAKE 1 TABLET BY MOUTH DAILY McElwee, Lauren A, NP Taking Active   glipiZIDE (GLUCOTROL XL) 2.5 MG 24 hr tablet [id number] No Take 1 tablet (2.5 mg total) by mouth daily with breakfast. Sandford Craze, NP Taking Active   levalbuterol Pauline Aus) 0.63 MG/3ML nebulizer solution [id number] No Take 3 mLs (0.63 mg total) by nebulization every 4 (four) hours as needed for wheezing or shortness of breath.  Patient not taking: Reported on 01/05/2024   Glenford Bayley, NP Not Taking Active   LORazepam (ATIVAN) 0.5 MG tablet [id number] No Take 1 tablet (0.5 mg total) by mouth every 8 (eight) hours as needed for severe anxiety/shortness of breath not relieved by roxanol  Taking Active   omeprazole (PRILOSEC) 40 MG capsule [id number] No Take 1 capsule (40 mg total) by mouth daily. Worthy Rancher B, FNP Taking Active   oxyCODONE (OXY IR/ROXICODONE) 5 MG immediate release tablet [id number] No Take 5 mg by mouth every 4 (four) hours as needed. [provider] Taking Active   potassium chloride (KLOR-CON) 10 MEQ tablet [id number] No Take 1 tablet (10 mEq total) by mouth daily. Tereso Newcomer T, PA-C Taking Active Self, Pharmacy Records  predniSONE (DELTASONE) 50 MG tablet [id number] No Take 1 tablet by mouth 14 hours then 7 hours then 2 hours before the scan. Please make sure to take 2 tablet Benadryl over-the-counter 25 mg with the last dose of prednisone 2 hours before the scan  Patient not taking: Reported on 01/05/2024  Si Gaul, MD Not Taking Active   pregabalin (LYRICA) 50 MG capsule [id number] No Take 50 mg by mouth 2 (two) times daily. [provider] Taking Active   prochlorperazine (COMPAZINE) 10 MG tablet [id number] No Take 10 mg by mouth every 6 (six) hours as needed. [provider] Taking Active   rivaroxaban  (XARELTO) 20 MG TABS tablet [id number] No Take 1 tablet (20 mg total) by mouth every evening with supper. Joseph Art, DO Taking Active   senna (SENNA LAXATIVE) 8.6 MG tablet [id number] No Take 1-4 tablets (8.6-34.4 mg total) by mouth 2 (two) times daily as needed for constipation  Taking Active   spironolactone (ALDACTONE) 50 MG tablet [id number] No Take 50 mg by mouth daily. [provider] Taking Active   tamsulosin (FLOMAX) 0.4 MG CAPS capsule [id number] No Take 1 capsule (0.4 mg total) by mouth at bedtime. Sandford Craze, NP Taking Active             Home Care and Equipment/Supplies: Were Home Health Services Ordered?: NA Any new equipment or medical supplies ordered?: NA  Functional Questionnaire: Do you need assistance with bathing/showering or dressing?: No Do you need assistance with meal preparation?: No Do you need assistance with eating?: No Do you have difficulty maintaining continence: No Do you need assistance with getting out of bed/getting out of a chair/moving?: No Do you have difficulty managing or taking your medications?: No  Follow up appointments reviewed: PCP Follow-up appointment confirmed?: Yes Date of PCP follow-up appointment?: 01/28/24 Follow-up Provider: [facility] Follow-up appointment confirmed?: NA Do you need transportation to your follow-up appointment?: No Do you understand care options if your condition(s) worsen?: Yes-patient verbalized understanding    SIGNATURE Karena Addison, LPN [facility] Nurse Health Advisor Direct Dial [phone]

## 2024-01-22 NOTE — Telephone Encounter (Signed)
 Spoke with patients niece Steward Drone about pre meds for CT scan.  She states that patient fell and went to [place].  Patient is now at [facility] in [facility].  Steward Drone stated the hospice nurse is there now and the hospice doctor is coming this afternoon. She has requested to cancel CT scan and appt with Dr. Arbutus Ped.  Appts canceled. Informed Steward Drone to call with any new concerns or questions.

## 2024-01-26 ENCOUNTER — Ambulatory Visit (HOSPITAL_COMMUNITY): Payer: Medicare Other

## 2024-01-26 ENCOUNTER — Inpatient Hospital Stay: Payer: Medicare Other

## 2024-01-26 DIAGNOSIS — M48062 Spinal stenosis, lumbar region with neurogenic claudication: Secondary | ICD-10-CM | POA: Diagnosis not present

## 2024-01-28 ENCOUNTER — Ambulatory Visit (HOSPITAL_BASED_OUTPATIENT_CLINIC_OR_DEPARTMENT_OTHER)
Admission: RE | Admit: 2024-01-28 | Discharge: 2024-01-28 | Disposition: A | Discharge status: Home / Self Care | Source: Ambulatory Visit | Attending: Family | Admitting: Family

## 2024-01-28 ENCOUNTER — Other Ambulatory Visit (HOSPITAL_BASED_OUTPATIENT_CLINIC_OR_DEPARTMENT_OTHER): Payer: Self-pay

## 2024-01-28 ENCOUNTER — Ambulatory Visit (INDEPENDENT_AMBULATORY_CARE_PROVIDER_SITE_OTHER): Admitting: Family

## 2024-01-28 ENCOUNTER — Other Ambulatory Visit: Payer: Self-pay | Admitting: Family

## 2024-01-28 ENCOUNTER — Inpatient Hospital Stay: Payer: Medicare Other | Admitting: Internal Medicine

## 2024-01-28 VITALS — BP 134/82 | HR 110 | Temp 97.6°F | Resp 16 | Ht 67.0 in | Wt 183.0 lb

## 2024-01-28 DIAGNOSIS — E119 Type 2 diabetes mellitus without complications: Secondary | ICD-10-CM

## 2024-01-28 DIAGNOSIS — G8929 Other chronic pain: Secondary | ICD-10-CM

## 2024-01-28 DIAGNOSIS — N401 Enlarged prostate with lower urinary tract symptoms: Secondary | ICD-10-CM

## 2024-01-28 DIAGNOSIS — Z7984 Long term (current) use of oral hypoglycemic drugs: Secondary | ICD-10-CM

## 2024-01-28 DIAGNOSIS — I10 Essential (primary) hypertension: Secondary | ICD-10-CM | POA: Diagnosis not present

## 2024-01-28 DIAGNOSIS — I4819 Other persistent atrial fibrillation: Secondary | ICD-10-CM | POA: Diagnosis not present

## 2024-01-28 DIAGNOSIS — M545 Low back pain, unspecified: Secondary | ICD-10-CM | POA: Diagnosis not present

## 2024-01-28 DIAGNOSIS — I714 Abdominal aortic aneurysm, without rupture, unspecified: Secondary | ICD-10-CM

## 2024-01-28 DIAGNOSIS — R351 Nocturia: Secondary | ICD-10-CM

## 2024-01-28 DIAGNOSIS — K219 Gastro-esophageal reflux disease without esophagitis: Secondary | ICD-10-CM | POA: Diagnosis not present

## 2024-01-28 DIAGNOSIS — E1142 Type 2 diabetes mellitus with diabetic polyneuropathy: Secondary | ICD-10-CM

## 2024-01-28 DIAGNOSIS — I5033 Acute on chronic diastolic (congestive) heart failure: Secondary | ICD-10-CM

## 2024-01-28 LAB — HEMOGLOBIN A1C: Hgb A1c MFr Bld: 6.2 % (ref 4.6–6.5)

## 2024-01-28 MED ORDER — GLIPIZIDE ER 2.5 MG PO TB24
2.5000 mg | ORAL_TABLET | Freq: Every day | ORAL | 0 refills | Status: DC
  Filled 2024-01-28 (×2): qty 90, 90d supply, fill #0

## 2024-01-28 MED ORDER — DILTIAZEM HCL ER COATED BEADS 120 MG PO CP24
120.0000 mg | ORAL_CAPSULE | Freq: Every day | ORAL | 1 refills | Status: DC
  Filled 2024-01-28 – 2024-03-26 (×2): qty 90, 90d supply, fill #0
  Filled 2024-03-26: qty 30, 30d supply, fill #0
  Filled 2024-03-26: qty 90, 90d supply, fill #0
  Filled 2024-04-19: qty 30, 30d supply, fill #1
  Filled 2024-06-10: qty 30, 30d supply, fill #2
  Filled 2024-07-12: qty 30, 30d supply, fill #3
  Filled 2024-08-06 (×2): qty 30, 30d supply, fill #4
  Filled 2024-09-07: qty 30, 30d supply, fill #5

## 2024-01-28 MED ORDER — GLIPIZIDE ER 2.5 MG PO TB24
2.5000 mg | ORAL_TABLET | Freq: Every day | ORAL | 0 refills | Status: DC
  Filled 2024-01-28: qty 90, 90d supply, fill #0

## 2024-01-28 MED ORDER — OMEPRAZOLE 40 MG PO CPDR
40.0000 mg | DELAYED_RELEASE_CAPSULE | Freq: Every day | ORAL | 1 refills | Status: AC
Start: 2024-01-28 — End: ?
  Filled 2024-01-28 – 2024-02-18 (×2): qty 90, 90d supply, fill #0
  Filled 2024-06-15: qty 90, 90d supply, fill #1

## 2024-01-28 NOTE — Assessment & Plan Note (Signed)
 Pt appears volume overloaded.  Advised pt to increase his furosemide to 40mg  bid x 3 days, then return to once daily. Continue aldactone.

## 2024-01-28 NOTE — Assessment & Plan Note (Signed)
 Requesting to switch back to tramadol from oxycodone as tramadol did not make him nauseas.  I will reach out to his palliative care provider who is prescribing oxycodone about this request.

## 2024-01-28 NOTE — Assessment & Plan Note (Signed)
 Lab Results  Component Value Date   HGBA1C 7.2 (H) 09/16/2023   HGBA1C 6.7 (H) 04/03/2023   HGBA1C 7.4 (H) 11/15/2022   Lab Results  Component Value Date   MICROALBUR 1.8 09/16/2023   LDLCALC 47 08/07/2023   CREATININE 1.05 08/14/2023   Diet controlled. Update A1C.

## 2024-01-28 NOTE — Assessment & Plan Note (Signed)
Stable on omeprazole. Continue same.  ?

## 2024-01-28 NOTE — Assessment & Plan Note (Signed)
 Rate stable on diltiazem and amiodarone, and xarelto.

## 2024-01-28 NOTE — Assessment & Plan Note (Signed)
 Stable with flomax. Continue same.

## 2024-01-28 NOTE — Assessment & Plan Note (Signed)
 Stable on lyrica.

## 2024-01-28 NOTE — Progress Notes (Unsigned)
 Subjective:     Patient ID: Robert Bates, male    DOB: [birth year], 83 y.o.   MRN: [id number]  Chief Complaint  Patient presents with   Hypertension    Here for follow up   Insomnia    Patient reports he is having trouble sleeping       Discussed the use of AI scribe software for clinical note transcription with the patient, who gave verbal consent to proceed.  History of Present Illness  Patient is an 83 year old male who presents today for follow up.  He was seen in the ED on 3/19 after falling on his back porch.  He was alone and unable to get up, but thankfully had a life alert button and was able to call for help.   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   COPD (chronic obstructive pulmonary disease) (HCC)    Coronary artery disease    COVID-19 virus infection 04/23/2021   Diabetes type 2, controlled (HCC) 07/31/2017   Diverticulitis 07/29/2023   Dyspnea    Dysrhythmia    a fib   GERD (gastroesophageal reflux disease)    Hematuria    refuses work up or referral - understands risks of morbidity / mortality - 11/2008, 12/2008   Heme positive stool    History of hiatal hernia    History of kidney stones    Hyperlipemia    Meningioma (HCC) 10/25/2013   Follows with Dr. Coletta Memos.    Peripheral vascular disease (HCC)    Abdominal Aortic Aneursym    Pneumonia    as a child   Radiation 09/18/15-10/25/15   left lower lobe 70.2 Gy   Seizures (HCC) 02/18/2020   Tobacco abuse     Past Surgical History:  Procedure Laterality Date   CHOLECYSTECTOMY N/A 07/23/2017   Procedure: LAPAROSCOPIC CHOLECYSTECTOMY;  Surgeon: Rodman Pickle, MD;  Location: [facility];  Service: General;  Laterality: N/A;   CLIPPING OF ATRIAL APPENDAGE Left 05/08/2021   Procedure: CLIPPING OF ATRIAL APPENDAGE USING 45 ATRICLIP;  Surgeon: Linden Dolin, MD;  Location: [facility];  Service: Open Heart Surgery;  Laterality: Left;   COLONOSCOPY     CORONARY ARTERY BYPASS GRAFT N/A 05/08/2021   Procedure: CORONARY ARTERY BYPASS GRAFTING (CABG)X 3 USING LEFT INTERNAL MAMMARY ARTERY AND RIGHT GREATER SAPEHNOUS VEIN;  Surgeon: Linden Dolin, MD;  Location: [facility];  Service: Open Heart Surgery;  Laterality: N/A;   ENDOVEIN HARVEST OF GREATER SAPHENOUS VEIN Right 05/08/2021   Procedure: ENDOVEIN HARVEST OF GREATER SAPHENOUS VEIN;  Surgeon: Linden Dolin, MD;  Location: [facility];  Service: Open Heart Surgery;  Laterality: Right;   EYE SURGERY Bilateral    Cataracts removed w/ lens implant   HERNIA REPAIR  Left 36 years ago . Right inguinal hernia repair 10-01-17 Dr. Sheliah Hatch   INGUINAL HERNIA REPAIR Right 10/01/2017   Procedure: RIGHT INGUINAL HERNIA REPAIR WITH MESH;  Surgeon: Kinsinger, De Blanch, MD;  Location: [facility];  Service: General;  Laterality: Right;  TAP BLOCK   INSERTION OF MESH Right 10/01/2017   Procedure: INSERTION OF MESH;  Surgeon: Sheliah Hatch De Blanch, MD;  Location: [facility];  Service: General;  Laterality: Right;   IR THORACENTESIS ASP PLEURAL SPACE W/IMG GUIDE  05/18/2021   IR THORACENTESIS ASP PLEURAL SPACE W/IMG GUIDE  06/07/2021   LEFT HEART CATH AND CORONARY ANGIOGRAPHY N/A 04/20/2021   Procedure: LEFT HEART CATH AND CORONARY ANGIOGRAPHY;  Surgeon: Iran Ouch, MD;  [facility]: [facility];  Service: Cardiovascular;  Laterality: N/A;   TEE  WITHOUT CARDIOVERSION N/A 05/08/2021   Procedure: TRANSESOPHAGEAL ECHOCARDIOGRAM (TEE);  Surgeon: Linden Dolin, MD;  [facility];  Service: Open Heart Surgery;  Laterality: N/A;   TONSILLECTOMY     TONSILLECTOMY     VIDEO BRONCHOSCOPY Bilateral 07/26/2015   Procedure: VIDEO BRONCHOSCOPY WITH FLUORO;  Surgeon: Nyoka Cowden, MD;  [facility]: [facility];  Service: Cardiopulmonary;  Laterality: Bilateral;   VIDEO BRONCHOSCOPY WITH ENDOBRONCHIAL NAVIGATION N/A 08/23/2015   Procedure: VIDEO BRONCHOSCOPY WITH ENDOBRONCHIAL NAVIGATION;  Surgeon: Delight Ovens, MD;  [facility];  Service: Thoracic;  Laterality: N/A;   VIDEO BRONCHOSCOPY WITH ENDOBRONCHIAL ULTRASOUND N/A 08/23/2015   Procedure: VIDEO BRONCHOSCOPY WITH ENDOBRONCHIAL ULTRASOUND;  Surgeon: Delight Ovens, MD;  [facility]: [facility];  Service: Thoracic;  Laterality: N/A;    Family History  Problem Relation Age of Onset   Leukemia Father    Emphysema Father    Learning disabilities Son    Atrial fibrillation Son    Leukemia Other    Stroke Other     Social History   Socioeconomic History   Marital status: Widowed    Spouse name: Not on file   Number of children: 2   Years of education: Not on file   Highest education level: Not on file  Occupational History   Occupation: Retired    Associate Professor: [employer]    Comment: truck Air traffic controller: [employer]  Tobacco Use   Smoking status: Former    Current packs/day: 0.00    Average packs/day: 1 pack/day for 57.0 years (57.0 ttl pk-yrs)    Types: Cigarettes, Cigars    Start date: 08/07/1958    Quit date: 08/08/2015    Years since quitting: 8.4   Smokeless tobacco: Former    Types: Chew    Quit date: 11/04/1958   Tobacco comments:    Will smoke cigar every once in awhile.  Vaping daily started a couple weeks ago.  04/18/22 hfb    Patient states he is still vaping. AB, CMA 09-10-2023  Vaping Use   Vaping status: Some Days  Substance and Sexual Activity    Alcohol use: Not Currently    Alcohol/week: 0.0 standard drinks of alcohol   Drug use: No   Sexual activity: Not Currently  Other Topics Concern   Not on file  Social History Narrative   Not on file   Social Drivers of Health   Financial Resource Strain: Low Risk  (05/22/2023)   Overall Financial Resource Strain (CARDIA)    Difficulty of Paying Living Expenses: Not very hard  Food Insecurity: No Food Insecurity (08/14/2023)   Hunger Vital Sign    Worried About Running Out of  Food in the Last Year: Never true    Ran Out of Food in the Last Year: Never true  Transportation Needs: No Transportation Needs (08/14/2023)   PRAPARE - Administrator, Civil Service (Medical): No    Lack of Transportation (Non-Medical): No  Physical Activity: Insufficiently Active (08/05/2021)   Exercise Vital Sign    Days of Exercise per Week: 2 days    Minutes of Exercise per Session: 40 min  Stress: Stress Concern Present (05/22/2023)   [employer] of Occupational Health - Occupational Stress Questionnaire    Feeling of Stress : Rather much  Social Connections: Moderately Isolated (05/13/2022)   Social Connection and Isolation Panel [NHANES]    Frequency of Communication with Friends and Family: Twice a week    Frequency of Social Gatherings with Friends and Family: Once a week    Attends Religious Services: Never    Database administrator or Organizations: No    Attends Banker Meetings: Never    Marital Status: Married  Catering manager Violence: Not At Risk (08/04/2023)   Humiliation, Afraid, Rape, and Kick questionnaire    Fear of Current or Ex-Partner: No    Emotionally Abused: No    Physically Abused: No    Sexually Abused: No    Outpatient Medications Prior to Visit  Medication Sig Dispense Refill   acetaminophen (TYLENOL) 500 MG tablet Take 1-2 tablets (500-1,000 mg total) by mouth every 6 (six) hours as needed. (Patient taking differently: Take 1,000 mg by  mouth daily as needed for headache, fever or moderate pain (pain score 4-6).) 30 tablet 0   amiodarone (PACERONE) 200 MG tablet Take 1 tablet (200 mg total) by mouth daily. 90 tablet 3   Budeson-Glycopyrrol-Formoterol (BREZTRI AEROSPHERE) 160-9-4.8 MCG/ACT AERO Inhale 2 puffs into the lungs 2 (two) times daily.     diltiazem (CARDIZEM CD) 120 MG 24 hr capsule Take 1 capsule (120 mg total) by mouth daily. 90 capsule 1   furosemide (LASIX) 40 MG tablet TAKE 1 TABLET BY MOUTH DAILY 90 tablet 0   levalbuterol (XOPENEX) 0.63 MG/3ML nebulizer solution Take 3 mLs (0.63 mg total) by nebulization every 4 (four) hours as needed for wheezing or shortness of breath. 75 mL 12   LORazepam (ATIVAN) 0.5 MG tablet Take 1 tablet (0.5 mg total) by mouth every 8 (eight) hours as needed for severe anxiety/shortness of breath not relieved by roxanol 6 tablet 0   oxyCODONE (OXY IR/ROXICODONE) 5 MG immediate release tablet Take 5 mg by mouth every 4 (four) hours as needed.     potassium chloride (KLOR-CON) 10 MEQ tablet Take 1 tablet (10 mEq total) by mouth daily. 90 tablet 3   pregabalin (LYRICA) 50 MG capsule Take 50 mg by mouth 2 (two) times daily.     prochlorperazine (COMPAZINE) 10 MG tablet Take 10 mg by mouth every 6 (six) hours as needed.     rivaroxaban (XARELTO) 20 MG TABS tablet Take 1 tablet (20 mg total) by mouth every evening with supper. 90 tablet 1   senna (SENNA LAXATIVE) 8.6 MG tablet Take 1-4 tablets (8.6-34.4 mg total) by mouth 2 (two) times daily as needed for constipation 100 tablet 0   spironolactone (ALDACTONE) 50 MG tablet Take 50 mg by mouth daily.     tamsulosin (FLOMAX) 0.4 MG CAPS capsule Take 1 capsule (0.4 mg total) by mouth at bedtime. 90 capsule 1   glipiZIDE (GLUCOTROL XL) 2.5 MG 24 hr tablet Take 1 tablet (  2.5 mg total) by mouth daily with breakfast. 90 tablet 0   omeprazole (PRILOSEC) 40 MG capsule Take 1 capsule (40 mg total) by mouth daily. 90 capsule 0   predniSONE (DELTASONE) 50 MG  tablet Take 1 tablet by mouth 14 hours then 7 hours then 2 hours before the scan. Please make sure to take 2 tablet Benadryl over-the-counter 25 mg with the last dose of prednisone 2 hours before the scan (Patient not taking: Reported on 01/05/2024) 3 tablet 0   No facility-administered medications prior to visit.    Allergies  Allergen Reactions   Iodine Swelling    Neck, gland swelling   Iohexol Swelling    Neck, gland swelling   Metformin And Related Diarrhea    Review of Systems  Psychiatric/Behavioral:  The patient has insomnia.        Objective:    Physical Exam Constitutional:      General: He is not in acute distress.    Appearance: He is well-developed.  HENT:     Head: Normocephalic and atraumatic.  Cardiovascular:     Rate and Rhythm: Normal rate and regular rhythm.     Heart sounds: No murmur heard. Pulmonary:     Effort: Pulmonary effort is normal. No respiratory distress.     Breath sounds: Wheezing (soft expiratory wheeze) present. No rales.  Abdominal:     Comments: protuberant  Musculoskeletal:        General: Swelling (3+ LE edema bilaterally) present.  Skin:    General: Skin is warm and dry.  Neurological:     Mental Status: He is alert and oriented to person, place, and time.  Psychiatric:        Behavior: Behavior normal.        Thought Content: Thought content normal.    Diabetic Foot Exam - Simple   Simple Foot Form Diabetic Foot exam was performed with the following findings: Yes 01/28/2024  1:23 PM  Visual Inspection No deformities, no ulcerations, no other skin breakdown bilaterally: Yes Sensation Testing Intact to touch and monofilament testing bilaterally: Yes Pulse Check Posterior Tibialis and Dorsalis pulse intact bilaterally: Yes Comments      BP 134/82 (BP Location: Right Arm, Patient Position: Sitting, Cuff Size: Normal)   Pulse (!) 110   Temp 97.6 F (36.4 C) (Oral)   Resp 16   Ht 5\' 7"  (1.702 m)   Wt 183 lb (83 kg)    SpO2 98%   BMI 28.66 kg/m  Wt Readings from Last 3 Encounters:  01/28/24 183 lb (83 kg)  12/19/23 180 lb 12.8 oz (82 kg)  10/14/23 174 lb (78.9 kg)       Assessment & Plan:   Problem List Items Addressed This Visit       Unprioritized   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HCC) (Chronic)   He did not complete follow up US. He is advised to stop by imaging to schedule on his way out today.       Primary hypertension - Primary   BP stable on aldactone and diltiazem.       Persistent atrial fibrillation (HCC)   Rate stable on diltiazem and amiodarone, and xarelto.       Low back pain   Requesting to switch back to tramadol from oxycodone as tramadol did not make him nauseas.  I will reach out to his palliative care provider who is prescribing oxycodone about this request.       GERD   Stable on  omeprazole.  Continue same.       Relevant Medications   omeprazole (PRILOSEC) 40 MG capsule   Diabetic peripheral neuropathy (HCC)   Stable on lyrica.        Relevant Medications   glipiZIDE (GLUCOTROL XL) 2.5 MG 24 hr tablet   Diabetes type 2, controlled (HCC)   Lab Results  Component Value Date   HGBA1C 7.2 (H) 09/16/2023   HGBA1C 6.7 (H) 04/03/2023   HGBA1C 7.4 (H) 11/15/2022   Lab Results  Component Value Date   MICROALBUR 1.8 09/16/2023   LDLCALC 47 08/07/2023   CREATININE 1.05 08/14/2023   Diet controlled. Update A1C.       Relevant Medications   glipiZIDE (GLUCOTROL XL) 2.5 MG 24 hr tablet   Other Relevant Orders   HgB A1c   CHF (congestive heart failure) (HCC)   Pt appears volume overloaded.  Advised pt to increase his furosemide to 40mg  bid x 3 days, then return to once daily. Continue aldactone.       Benign prostatic hyperplasia with nocturia   Stable with flomax. Continue same.        I have discontinued Maurion Gunning's predniSONE. I am also having him maintain his acetaminophen, potassium chloride, rivaroxaban, LORazepam, senna, levalbuterol, Breztri  Aerosphere, amiodarone, spironolactone, oxyCODONE, tamsulosin, prochlorperazine, pregabalin, furosemide, diltiazem, glipiZIDE, and omeprazole.  Meds ordered this encounter  Medications   glipiZIDE (GLUCOTROL XL) 2.5 MG 24 hr tablet    Sig: Take 1 tablet (2.5 mg total) by mouth daily with breakfast.    Dispense:  90 tablet    Refill:  0    Supervising Provider:   Danise Edge A [[id number]]   omeprazole (PRILOSEC) 40 MG capsule    Sig: Take 1 capsule (40 mg total) by mouth daily.    Dispense:  90 capsule    Refill:  1    Supervising Provider:   Danise Edge A [[id number]]

## 2024-01-28 NOTE — Assessment & Plan Note (Signed)
 BP stable on aldactone and diltiazem.

## 2024-01-28 NOTE — Assessment & Plan Note (Addendum)
 He did not complete follow up US. He is advised to stop by imaging to schedule on his way out today.

## 2024-01-29 ENCOUNTER — Encounter: Payer: Self-pay | Admitting: Family

## 2024-01-29 ENCOUNTER — Other Ambulatory Visit (HOSPITAL_BASED_OUTPATIENT_CLINIC_OR_DEPARTMENT_OTHER): Payer: Self-pay

## 2024-01-29 ENCOUNTER — Telehealth: Payer: Self-pay

## 2024-01-29 ENCOUNTER — Ambulatory Visit: Admitting: Physical Therapy

## 2024-01-29 NOTE — Telephone Encounter (Signed)
 Copied from CRM [id number]. Topic: Clinical - Lab/Test Results >> Jan 29, 2024 11:16 AM Desma Mcgregor wrote: Reason for CRM: Mr. Bowdish requesting to speak with Windell Moulding about his imaging results from yesterday. Please follow up.

## 2024-01-29 NOTE — Telephone Encounter (Signed)
 Patient notified radiologist is behind on reading reports and it may take over a week for [place] to get this results.

## 2024-01-30 ENCOUNTER — Telehealth: Payer: Self-pay

## 2024-01-30 NOTE — Telephone Encounter (Signed)
 Copied from CRM [id number]. Topic: Clinical - Medication Question >> Jan 30, 2024  2:41 PM Tiffany S wrote: Reason for CRM: Patient is asking for Tammy to give him a call about medication question please follow up with patient

## 2024-02-03 NOTE — Telephone Encounter (Signed)
 I reached out to Dr. Romie Minus the palliative care MD and asked if he would switch him to tramadol since he is the current prescriber of his oxycodone.  I am waiting to hear back from him.

## 2024-02-03 NOTE — Telephone Encounter (Signed)
 Patient called to let me know that he has been released from [facility]osp[facility] and would like have a face to face to review his medication. He already has an appointment for tomorrow 4/2 with me

## 2024-02-03 NOTE — Telephone Encounter (Signed)
 Patient will come in person tomorrow to review his medications with me.  He asked about Robert Bates taking over writing his medications again. He specifically asked about pain medication. He does not want to continue oxycodone but "other pain medications that was prescribed by Melissa in the past". Looks like that might be tramadol. Patient was advised that he will likely need to be seen in the office by Robert Bates for evaluation for her to determine need for pain medication.  Forwarding to PCP and CMA for information and to determine if appointment is needed.

## 2024-02-04 ENCOUNTER — Other Ambulatory Visit (HOSPITAL_BASED_OUTPATIENT_CLINIC_OR_DEPARTMENT_OTHER): Payer: Self-pay

## 2024-02-04 ENCOUNTER — Ambulatory Visit

## 2024-02-04 ENCOUNTER — Telehealth: Payer: Self-pay | Admitting: Emergency Medicine

## 2024-02-04 ENCOUNTER — Ambulatory Visit: Admitting: Family

## 2024-02-04 DIAGNOSIS — I4819 Other persistent atrial fibrillation: Secondary | ICD-10-CM | POA: Diagnosis not present

## 2024-02-04 DIAGNOSIS — G8929 Other chronic pain: Secondary | ICD-10-CM

## 2024-02-04 DIAGNOSIS — M545 Low back pain, unspecified: Secondary | ICD-10-CM

## 2024-02-04 DIAGNOSIS — E1142 Type 2 diabetes mellitus with diabetic polyneuropathy: Secondary | ICD-10-CM | POA: Diagnosis not present

## 2024-02-04 DIAGNOSIS — Z79899 Other long term (current) drug therapy: Secondary | ICD-10-CM

## 2024-02-04 DIAGNOSIS — E119 Type 2 diabetes mellitus without complications: Secondary | ICD-10-CM | POA: Diagnosis not present

## 2024-02-04 NOTE — Telephone Encounter (Signed)
 Called patient back. He had question about if he needed to bring his medications to his appointment Friday. Encouraged him to bring in medications if Melissa makes any medication changes then she could show him and write changes on his bottles.

## 2024-02-04 NOTE — Progress Notes (Unsigned)
 11/10/2023 Name: Robert Bates MRN: [id number] DOB: 04/22/1941  Chief Complaint  Patient presents with   Medication Management    Robert Bates is a 83 y.o. year old male who presented for a telephone visit today   They were referred to the pharmacist by their PCP for assistance in managing complex medication management.   Subjective: Patient has been in the care of [facility] since October 2024. Today he reports that he has been released from the Palliative Care / Hospice program.    Patient's niece has been helping with his medications but he would prefer to do them himself. When I pointed out that he has been able to stay out of the hospital for several months and I felt that having a second person to review his medication has helped, he stated that he felt that his niece helping with medications is OK but he feels that he can still handle his own finances and other affairs.  He is a little worried that his O2 supplementation at home will stop if Palliative care stops - he has a follow up with pulmonology 03/25/2024.   He has weekly medication container with him today but only has one slot left to take which is Wednesday / today in the evening;  Evening slot contains - furosemide 40mg  Xarelto 20mg , diltiazem 120mg , pregabalin 50mg  and tamsulosin 0.4mg   Mornings - the following: spironolactone 50mg  , amiodarone 200mg , glipizide XL 2.5mg , omeprazole 40mg , potassium 10 mEQ, pregabalin 50mg    As needed medications: lorazepam, oxycodone, prochlorperazine  Patient also mentions that he has received red boxes from a mail order pharmacy but he does not use them. From his med refill history it looks like [facility] if filling the following meds in adherence packages - potassium, omeprazole, furosemide and spironolactone (only 4 of his 10 maintenance medications)    Patient reports affordability concerns with their medications: No  - all meds filled thru  Hospice are $0.  He reports he has 1 bottle of Xarelto with 30 tablets at home.  Patient reports access/transportation concerns to their pharmacy: No  Patient reports adherence concerns with their medications:  No   .     Pain - patient has been taking oxycodone as needed for pain. He took last dose today. He would prefer to change to tramadol as it does not cause nausea. He has been prescribed oxycodone form [facility]osp[facility] and Palliative care physician but it appears he is not longer under their care.   Objective:  Lab Results  Component Value Date   HGBA1C 6.2 01/28/2024    Lab Results  Component Value Date   CREATININE 1.05 08/14/2023   BUN 29 (H) 08/14/2023   NA 136 08/14/2023   K 4.6 08/14/2023   CL 96 08/14/2023   CO2 33 (H) 08/14/2023    Lab Results  Component Value Date   CHOL 142 08/07/2023   HDL 79 08/07/2023   LDLCALC 47 08/07/2023   LDLDIRECT 142.2 12/26/2008   TRIG 81 08/07/2023   CHOLHDL 1.8 08/07/2023    Medications Reviewed Today     Reviewed by [name], RPH-CPP (Pharmacist) on 02/04/24 at 1022  Med List Status: <None>   Medication Order Taking? Sig Documenting Provider Last Dose Status Informant  acetaminophen (TYLENOL) 500 MG tablet [id number]  Take 1-2 tablets (500-1,000 mg total) by mouth every 6 (six) hours as needed.  Patient taking differently: Take 1,000 mg by mouth daily as needed for headache, fever or  moderate pain (pain score 4-6).   Ardelle Balls, PA-C  Active Self, Pharmacy Records           Med Note [facility] [place] [facility]   Wed Jul 16, 2023 11:25 AM)    amiodarone (PACERONE) 200 MG tablet [id number] Yes Take 1 tablet (200 mg total) by mouth daily. Nahser, Deloris Ping, MD Taking Active   Budeson-Glycopyrrol-Formoterol (BREZTRI AEROSPHERE) 160-9-4.8 MCG/ACT Sandrea Matte [id number]  Inhale 2 puffs into the lungs 2 (two) times daily. Sandford Craze, NP  Active   diltiazem (CARDIZEM CD) 120 MG 24 hr capsule [id number] Yes Take 1 capsule (120 mg total)  by mouth daily. Sandford Craze, NP Taking Active   furosemide (LASIX) 40 MG tablet [id number] Yes TAKE 1 TABLET BY MOUTH DAILY McElwee, Lauren A, NP Taking Active   glipiZIDE (GLUCOTROL XL) 2.5 MG 24 hr tablet [id number] Yes Take 1 tablet (2.5 mg total) by mouth daily with breakfast. Sandford Craze, NP Taking Active   levalbuterol Pauline Aus) 0.63 MG/3ML nebulizer solution [id number] No Take 3 mLs (0.63 mg total) by nebulization every 4 (four) hours as needed for wheezing or shortness of breath.  Patient not taking: Reported on 02/04/2024   Glenford Bayley, NP Not Taking Active   LORazepam (ATIVAN) 0.5 MG tablet [id number] Yes Take 1 tablet (0.5 mg total) by mouth every 8 (eight) hours as needed for severe anxiety/shortness of breath not relieved by roxanol  Taking Active   omeprazole (PRILOSEC) 40 MG capsule [id number] Yes Take 1 capsule (40 mg total) by mouth daily. Sandford Craze, NP Taking Active   oxyCODONE (OXY IR/ROXICODONE) 5 MG immediate release tablet [id number] Yes Take 5 mg by mouth every 4 (four) hours as needed. [provider] Taking Active   potassium chloride (KLOR-CON) 10 MEQ tablet [id number] Yes Take 1 tablet (10 mEq total) by mouth daily. Tereso Newcomer T, PA-C Taking Active Self, Pharmacy Records  pregabalin (LYRICA) 50 MG capsule [id number] Yes Take 50 mg by mouth 2 (two) times daily. [provider] Taking Active   prochlorperazine (COMPAZINE) 10 MG tablet [id number] Yes Take 10 mg by mouth every 6 (six) hours as needed. [provider] Taking Active   rivaroxaban (XARELTO) 20 MG TABS tablet [id number] Yes Take 1 tablet (20 mg total) by mouth every evening with supper. Joseph Art, DO Taking Active   senna (SENNA LAXATIVE) 8.6 MG tablet [id number] No Take 1-4 tablets (8.6-34.4 mg total) by mouth 2 (two) times daily as needed for constipation  Patient not taking: Reported on 02/04/2024    Not Taking Active   spironolactone (ALDACTONE) 50 MG tablet  [id number] Yes Take 50 mg by mouth daily. [provider] Taking Active   tamsulosin (FLOMAX) 0.4 MG CAPS capsule [id number] Yes Take 1 capsule (0.4 mg total) by mouth at bedtime. Sandford Craze, NP Taking Active               Assessment/Plan:   Medication Management: - Reviewed his medication list and compared to bottles patient brought in to office. He has all medications on hand with the exception of oxycodone, He would like to change to tramadol - reminded patient that he will need to discuss this with his PCP at appt 02/06/2024.  - Assisted patient to call [facility] to cancel upcoming refills since he is not using their adherence packaging.  - Recommended patient discuss O2 needs with pulmonologist at next appointment 03/25/2024 - or call for sooner appointment if needed.  - Spoke with representative from [facility]. Patient was discharge from their service 01/30/2024. Will let his PCP know.  - Assisting patient in getting all prescriptions back at [facility] at his request. He will need prescriptions for the following medications:   Amiodarone and Xarelto- Dr Elease Hashimoto  Potassium 10 mEq, Pregabalin - last prescribed by Dr Neale Burly Spironolactone - Sandford Craze   - If we do packaging in the future, could consider something like this: Morning: amiodarone, glipizide, omeprazole Afternoon: furosemide (patient preference to take in afternoon) Evening Meal: diltiazem, potassium, Xarelto, tamsulosin Pregabalin and spironolactone are not able to be included in packaging   Follow Up Plan: 2 to 4 weeks  [name], PharmD Clinical Pharmacist [facility]

## 2024-02-04 NOTE — Telephone Encounter (Signed)
 Copied from CRM [id number]. Topic: Clinical - Medication Question >> Feb 04, 2024  2:50 PM Almira Coaster wrote: Reason for CRM: Patient would like to speak with Windell Moulding regarding some medication concerns he has. He soke with Tammy Eckard today and his niece confused while refilling his mediation box.

## 2024-02-06 ENCOUNTER — Other Ambulatory Visit (HOSPITAL_BASED_OUTPATIENT_CLINIC_OR_DEPARTMENT_OTHER): Payer: Self-pay

## 2024-02-06 ENCOUNTER — Ambulatory Visit (INDEPENDENT_AMBULATORY_CARE_PROVIDER_SITE_OTHER): Admitting: Family

## 2024-02-06 VITALS — BP 138/74 | HR 58 | Temp 98.2°F | Resp 16 | Ht 66.0 in | Wt 185.0 lb

## 2024-02-06 DIAGNOSIS — Z79899 Other long term (current) drug therapy: Secondary | ICD-10-CM

## 2024-02-06 DIAGNOSIS — G8929 Other chronic pain: Secondary | ICD-10-CM

## 2024-02-06 DIAGNOSIS — M545 Low back pain, unspecified: Secondary | ICD-10-CM

## 2024-02-06 DIAGNOSIS — I5032 Chronic diastolic (congestive) heart failure: Secondary | ICD-10-CM | POA: Diagnosis not present

## 2024-02-06 MED ORDER — TRAMADOL HCL 50 MG PO TABS
50.0000 mg | ORAL_TABLET | Freq: Two times a day (BID) | ORAL | 0 refills | Status: AC | PRN
Start: 2024-02-06 — End: 2024-03-07
  Filled 2024-02-06: qty 60, 30d supply, fill #0

## 2024-02-06 NOTE — Progress Notes (Addendum)
   Established Patient Office Visit  Subjective   Patient ID: Robert Bates, male    DOB: 1940-11-10  Age: 83 y.o. MRN: [id number]   Patient presents for follow-up of back pain. States he has had back pain for the past 9 months. Has been seen by a spine specialist who has done back injections with no relief. He reports that the pain radiates down his legs and causes lower extremity weakness. Patient states the only thing that relieves the pain is his "pain pills" and a hot shower will occasionally help. Standing makes pain worse. Patient states the spine specialist recently called and wants the patient to come into the office to discuss the mild procedure. The mild procedure is a minimally invasive procedure for lumbar spinal stenosis.   [email or web address]  Back Pain   Review of Systems  Musculoskeletal:  Positive for back pain.  See HPI    Objective:     Physical Exam Vitals reviewed.  Constitutional:      Appearance: Normal appearance.  HENT:     Head: Normocephalic and atraumatic.     Right Ear: External ear normal.     Left Ear: External ear normal.  Cardiovascular:     Rate and Rhythm: Normal rate and regular rhythm.     Pulses: Normal pulses.     Heart sounds: Normal heart sounds.  Pulmonary:     Effort: Pulmonary effort is normal.     Breath sounds: Wheezing present.     Comments: Expiratory wheezing heard throughout all lung fields. Patient denies dyspnea. Reports he always wheezes.  Abdominal:     General: There is distension.  Musculoskeletal:     Right lower leg: Edema present.     Left lower leg: Edema present.  Skin:    General: Skin is warm and dry.     Capillary Refill: Capillary refill takes less than 2 seconds.  Neurological:     Mental Status: He is alert and oriented to person, place, and time.  Psychiatric:        Behavior: Behavior normal.      Assessment & Plan:   Low back pain - existing problem. Requesting  medication management. Controlled substance contract signed. Stop Lyrica due to ineffective pain management. Will start patient on tramadol 50 mg bid prn. Discussed medication safety with patient. Spine specialist has requested patient come to office to discuss mild procedure. Patient will call to schedule.  Chronic heart failure - existing problem. Lower extremity edema and abdominal distension noted. Increase aldactone from 25 mg to 50 mg. Will continue furosemide.   Cristopher Peru, RN

## 2024-02-06 NOTE — Patient Instructions (Addendum)
 VISIT SUMMARY:  Today, you visited [place] for pain management. You reported chronic lower back pain, new burning pain on the side where you had lung cancer, breathing difficulties, stomach issues, and swelling in your ankles. We discussed your current medications and symptoms, and we have made some adjustments to your treatment plan.  YOUR PLAN:  -CHRONIC BACK PAIN: Chronic back pain is long-lasting pain in the lower spine that worsens with standing and walking. We will stop Lyrica and start you on tramadol twice daily with acetaminophen, as it is effective and has a lower risk of causing drowsiness. We also discussed the importance of taking your pain medication on a regular schedule.  -LUNG CANCER: Due to your new chest pain symptoms, we are concerned about the possibility of lung cancer recurrence. We will schedule a CT scan to check for any signs of recurrence and coordinate with your oncology team based on the results.  -EDEMA: Edema is swelling caused by fluid buildup, often in the ankles. You have noticed increased swelling since your spironolactone dose was reduced. We will continue you on the higher dose of 50 mg and monitor your labs to ensure it is safe.  -GENERAL HEALTH MAINTENANCE: We discussed the shingles vaccination, which can help prevent shingles, a painful rash caused by the same virus that causes chickenpox. You are considering getting the vaccine.  INSTRUCTIONS:  Please schedule a CT scan to evaluate for lung cancer recurrence. Continue taking spironolactone 50 mg and monitor for any side effects. Follow the new pain management plan with tramadol and acetaminophen. Consider getting the shingles vaccination. Follow up with [place] after the CT scan and if you have any new or worsening symptoms.

## 2024-02-06 NOTE — Progress Notes (Signed)
 Subjective:     Patient ID: Robert Bates, male    DOB: [birth year], 83 y.o.   MRN: [id number]  Chief Complaint  Patient presents with   Back Pain    Here for follow up    Back Pain    Discussed the use of AI scribe software for clinical note transcription with the patient, who gave verbal consent to proceed.  History of Present Illness  Robert Bates is an 83 year old male who presents for pain management. He is accompanied by his niece who helps him with his medical care and medications.  He was recently discharged from the palliative care team.  He experiences chronic pain primarily in his lower back, described as a weakness in the spine area. The pain is most noticeable when he stands or walks and is temporarily alleviated by pain medication. He has a history of using tramadol and has been taking oxycodone, which causes nausea. Previously, he tried morphine, which also resulted in nausea. He recently started Lyrica twice a day, but it has not been effective, and he is out of the medication.  He reports a new symptom of burning pain on the side where he had lung cancer, which occurs when he wakes up in the morning. This symptom started about one to two weeks ago and is concerning to him, although he hopes it is not related to cancer recurrence.  He experiences additional symptoms including breathing difficulties, stomach issues, and swelling. His breathing is described as 'a little wheezy,' and he has noticed an increase in weight and swelling in his ankles. He was previously on 50 mg of spironolactone, which was reduced to 25 mg upon discharge from hospice, but he has resumed the 50 mg dose due to increased swelling.  He has not received the shingles vaccine and reports itching in the area where he received radiation treatment, likely due to scarring. No rashes or other skin changes are present.     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   COPD (chronic obstructive pulmonary disease) (HCC)    Coronary artery disease    COVID-19 virus infection 04/23/2021   Diabetes type 2, controlled (HCC) 07/31/2017   Diverticulitis 07/29/2023   Dyspnea    Dysrhythmia    a fib   GERD (gastroesophageal reflux disease)    Hematuria    refuses work up or referral - understands risks of morbidity / mortality - 11/2008, 12/2008   Heme positive stool    History of hiatal hernia    History of kidney stones    Hyperlipemia    Meningioma (HCC) 10/25/2013   Follows with Dr. Coletta Memos.    Peripheral vascular disease (HCC)    Abdominal Aortic Aneursym   Pneumonia    as a child   Radiation 09/18/15-10/25/15   left lower lobe 70.2 Gy   Seizures (HCC) 02/18/2020   Tobacco abuse     Past Surgical History:  Procedure Laterality Date   CHOLECYSTECTOMY N/A 07/23/2017   Procedure: LAPAROSCOPIC CHOLECYSTECTOMY;  Surgeon: Kinsinger, De Blanch, MD;  Location: [facility];  Service: General;  Laterality: N/A;   CLIPPING OF ATRIAL APPENDAGE Left 05/08/2021   Procedure: CLIPPING OF ATRIAL APPENDAGE USING 45 ATRICLIP;  Surgeon: Linden Dolin, MD;  Location: [facility];  Service: Open Heart Surgery;  Laterality: Left;   COLONOSCOPY     CORONARY ARTERY BYPASS GRAFT N/A 05/08/2021   Procedure: CORONARY ARTERY BYPASS GRAFTING (CABG)X 3 USING LEFT INTERNAL MAMMARY ARTERY AND RIGHT GREATER SAPEHNOUS VEIN;  Surgeon: Linden Dolin, MD;  Location: [facility];  Service: Open Heart Surgery;  Laterality: N/A;   ENDOVEIN  HARVEST OF GREATER SAPHENOUS VEIN Right 05/08/2021   Procedure: ENDOVEIN HARVEST OF GREATER SAPHENOUS VEIN;  Surgeon: Linden Dolin, MD;  Location: [facility];  Service: Open Heart Surgery;  Laterality: Right;   EYE SURGERY Bilateral    Cataracts removed w/ lens implant   HERNIA REPAIR     Left 36 years ago . Right inguinal hernia repair 10-01-17 Dr. Sheliah Hatch   INGUINAL HERNIA REPAIR Right 10/01/2017   Procedure: RIGHT INGUINAL HERNIA REPAIR WITH MESH;  Surgeon: Kinsinger, De Blanch, MD;  Location: [facility];  Service: General;  Laterality: Right;  TAP BLOCK   INSERTION OF MESH Right 10/01/2017   Procedure: INSERTION OF MESH;  Surgeon: Sheliah Hatch De Blanch, MD;  Location: [facility];  Service: General;  Laterality: Right;   IR THORACENTESIS ASP PLEURAL SPACE W/IMG GUIDE  05/18/2021   IR THORACENTESIS ASP PLEURAL SPACE W/IMG GUIDE  06/07/2021   LEFT HEART CATH AND CORONARY ANGIOGRAPHY N/A 04/20/2021   Procedure: LEFT HEART CATH AND CORONARY ANGIOGRAPHY;  Surgeon: Iran Ouch, MD;  [facility];  Service: Cardiovascular;  Laterality: N/A;   TEE WITHOUT CARDIOVERSION N/A 05/08/2021   Procedure: TRANSESOPHAGEAL ECHOCARDIOGRAM (TEE);  Surgeon: Linden Dolin, MD;  [facility];  Service: Open Heart Surgery;  Laterality: N/A;   TONSILLECTOMY     TONSILLECTOMY     VIDEO BRONCHOSCOPY Bilateral 07/26/2015   Procedure: VIDEO BRONCHOSCOPY WITH FLUORO;  Surgeon: Nyoka Cowden, MD;  [facility]: [facility];  Service: Cardiopulmonary;  Laterality: Bilateral;   VIDEO BRONCHOSCOPY WITH ENDOBRONCHIAL NAVIGATION N/A 08/23/2015   Procedure: VIDEO BRONCHOSCOPY WITH ENDOBRONCHIAL NAVIGATION;  Surgeon: Delight Ovens, MD;  [facility];  Service: Thoracic;  Laterality: N/A;   VIDEO BRONCHOSCOPY WITH ENDOBRONCHIAL ULTRASOUND N/A 08/23/2015   Procedure: VIDEO BRONCHOSCOPY WITH ENDOBRONCHIAL ULTRASOUND;  Surgeon: Delight Ovens, MD;  Location: [facility];  Service: Thoracic;  Laterality: N/A;     Family History  Problem Relation Age of Onset   Leukemia Father    Emphysema Father    Learning disabilities Son    Atrial fibrillation Son    Leukemia Other    Stroke Other     Social History   Socioeconomic History   Marital status: Widowed    Spouse name: Not on file   Number of children: 2   Years of education: Not on file   Highest education level: Not on file  Occupational History   Occupation: Retired    Associate Professor: [employer]    Comment: truck Air traffic controller: [employer]  Tobacco Use   Smoking status: Former    Current packs/day: 0.00    Average packs/day: 1 pack/day for 57.0 years (57.0 ttl pk-yrs)    Types: Cigarettes, Cigars    Start date: 08/07/1958    Quit  date: 08/08/2015    Years since quitting: 8.5   Smokeless tobacco: Former    Types: Chew    Quit date: 11/04/1958   Tobacco comments:    Will smoke cigar every once in awhile.  Vaping daily started a couple weeks ago.  04/18/22 hfb    Patient states he is still vaping. AB, CMA 09-10-2023  Vaping Use   Vaping status: Some Days  Substance and Sexual Activity   Alcohol use: Not Currently    Alcohol/week: 0.0 standard drinks of alcohol   Drug use: No   Sexual activity: Not Currently  Other Topics Concern   Not on file  Social History Narrative   Not on file   Social Drivers of Health   Financial Resource Strain: Low Risk  (05/22/2023)   Overall Financial Resource Strain (CARDIA)    Difficulty of Paying Living Expenses: Not very hard  Food Insecurity: No Food Insecurity (08/14/2023)   Hunger Vital Sign    Worried About Running Out of Food in the Last Year: Never true    Ran Out of Food in the Last Year: Never true  Transportation Needs: No Transportation Needs (08/14/2023)   PRAPARE - Administrator, Civil Service (Medical): No    Lack of Transportation (Non-Medical): No  Physical Activity: Insufficiently Active (08/05/2021)   Exercise Vital Sign    Days of Exercise per Week: 2  days    Minutes of Exercise per Session: 40 min  Stress: Stress Concern Present (05/22/2023)   [employer] of Occupational Health - Occupational Stress Questionnaire    Feeling of Stress : Rather much  Social Connections: Moderately Isolated (05/13/2022)   Social Connection and Isolation Panel [NHANES]    Frequency of Communication with Friends and Family: Twice a week    Frequency of Social Gatherings with Friends and Family: Once a week    Attends Religious Services: Never    Database administrator or Organizations: No    Attends Banker Meetings: Never    Marital Status: Married  Catering manager Violence: Not At Risk (08/04/2023)   Humiliation, Afraid, Rape, and Kick questionnaire    Fear of Current or Ex-Partner: No    Emotionally Abused: No    Physically Abused: No    Sexually Abused: No    Outpatient Medications Prior to Visit  Medication Sig Dispense Refill   acetaminophen (TYLENOL) 500 MG tablet Take 1-2 tablets (500-1,000 mg total) by mouth every 6 (six) hours as needed. (Patient taking differently: Take 1,000 mg by mouth daily as needed for headache, fever or moderate pain (pain score 4-6).) 30 tablet 0   amiodarone (PACERONE) 200 MG tablet Take 1 tablet (200 mg total) by mouth daily. 90 tablet 3   Budeson-Glycopyrrol-Formoterol (BREZTRI AEROSPHERE) 160-9-4.8 MCG/ACT AERO Inhale 2 puffs into the lungs 2 (two) times daily.     diltiazem (CARDIZEM CD) 120 MG 24 hr capsule Take 1 capsule (120 mg total) by mouth daily. 90 capsule 1   furosemide (LASIX) 40 MG tablet TAKE 1 TABLET BY MOUTH DAILY 90 tablet 0   glipiZIDE (GLUCOTROL XL) 2.5 MG 24 hr tablet Take 1 tablet (2.5 mg total) by mouth daily with breakfast. 90 tablet 0   levalbuterol (XOPENEX) 0.63 MG/3ML nebulizer solution Take 3 mLs (0.63 mg total) by nebulization every 4 (four) hours as needed for wheezing or shortness of breath. 75 mL 12   LORazepam (ATIVAN) 0.5 MG tablet Take 1 tablet (0.5 mg total) by  mouth  every 8 (eight) hours as needed for severe anxiety/shortness of breath not relieved by roxanol 6 tablet 0   omeprazole (PRILOSEC) 40 MG capsule Take 1 capsule (40 mg total) by mouth daily. 90 capsule 1   potassium chloride (KLOR-CON) 10 MEQ tablet Take 1 tablet (10 mEq total) by mouth daily. 90 tablet 3   pregabalin (LYRICA) 50 MG capsule Take 50 mg by mouth 2 (two) times daily.     prochlorperazine (COMPAZINE) 10 MG tablet Take 10 mg by mouth every 6 (six) hours as needed.     rivaroxaban (XARELTO) 20 MG TABS tablet Take 1 tablet (20 mg total) by mouth every evening with supper. 90 tablet 1   senna (SENNA LAXATIVE) 8.6 MG tablet Take 1-4 tablets (8.6-34.4 mg total) by mouth 2 (two) times daily as needed for constipation 100 tablet 0   spironolactone (ALDACTONE) 50 MG tablet Take 50 mg by mouth daily.     tamsulosin (FLOMAX) 0.4 MG CAPS capsule Take 1 capsule (0.4 mg total) by mouth at bedtime. 90 capsule 1   oxyCODONE (OXY IR/ROXICODONE) 5 MG immediate release tablet Take 5 mg by mouth every 4 (four) hours as needed.     No facility-administered medications prior to visit.    Allergies  Allergen Reactions   Iodine Swelling    Neck, gland swelling   Iohexol Swelling    Neck, gland swelling   Metformin And Related Diarrhea    Review of Systems  Musculoskeletal:  Positive for back pain.       Objective:    Physical Exam Constitutional:      General: He is not in acute distress.    Appearance: He is well-developed.  HENT:     Head: Normocephalic and atraumatic.  Cardiovascular:     Rate and Rhythm: Normal rate and regular rhythm.     Heart sounds: No murmur heard. Pulmonary:     Effort: Pulmonary effort is normal. No respiratory distress.     Breath sounds: Wheezing present. No rales.  Abdominal:     Comments: Abdomen is protuberant  Musculoskeletal:     Right lower leg: 3+ Edema present.     Left lower leg: 3+ Edema present.  Skin:    General: Skin is warm and dry.   Neurological:     Mental Status: He is alert and oriented to person, place, and time.  Psychiatric:        Behavior: Behavior normal.        Thought Content: Thought content normal.      BP 138/74 (BP Location: Right Arm, Patient Position: Sitting, Cuff Size: Normal)   Pulse (!) 58   Temp 98.2 F (36.8 C) (Oral)   Resp 16   Ht 5\' 6"  (1.676 m)   Wt 185 lb (83.9 kg)   SpO2 97%   BMI 29.86 kg/m  Wt Readings from Last 3 Encounters:  02/06/24 185 lb (83.9 kg)  01/28/24 183 lb (83 kg)  12/19/23 180 lb 12.8 oz (82 kg)       Assessment & Plan:   Problem List Items Addressed This Visit       Unprioritized   Low back pain - Primary    Chronic lower spine pain exacerbated by standing and walking. Oxycodone causes nausea. Lyrica ineffective. High anesthesia risk limits procedural options. Tramadol preferred for efficacy and lower sedation risk. - Discontinue Lyrica. - Initiate tramadol t50mg  twice daily with acetaminophen. - Educated on scheduled pain medication use. - Advised pt to  schedule chest CT which was ordered by oncology.       Relevant Medications   traMADol (ULTRAM) 50 MG tablet   Chronic heart failure with preserved ejection fraction (HFpEF) (HCC)   Appears mildly volume overloaded.  Will increase his aldactone from 25mg  to 50 mg which he has been on previously. Continue furosemide.      Other Visit Diagnoses       High risk medication use           I have discontinued Meghan Gammell's oxyCODONE. I am also having him start on traMADol. Additionally, I am having him maintain his acetaminophen, potassium chloride, rivaroxaban, LORazepam, senna, levalbuterol, Breztri Aerosphere, amiodarone, spironolactone, tamsulosin, prochlorperazine, pregabalin, furosemide, diltiazem, glipiZIDE, and omeprazole.  Meds ordered this encounter  Medications   traMADol (ULTRAM) 50 MG tablet    Sig: Take 1 tablet (50 mg total) by mouth every 12 (twelve) hours as needed.     Dispense:  60 tablet    Refill:  0    Supervising Provider:   Danise Edge A [[id number]]

## 2024-02-06 NOTE — Assessment & Plan Note (Signed)
 Appears mildly volume overloaded.  Will increase his aldactone from 25mg  to 50 mg which he has been on previously. Continue furosemide.

## 2024-02-06 NOTE — Assessment & Plan Note (Signed)
  Chronic lower spine pain exacerbated by standing and walking. Oxycodone causes nausea. Lyrica ineffective. High anesthesia risk limits procedural options. Tramadol preferred for efficacy and lower sedation risk. - Discontinue Lyrica. - Initiate tramadol t50mg  twice daily with acetaminophen. - Educated on scheduled pain medication use. - Advised pt to schedule chest CT which was ordered by oncology.

## 2024-02-08 ENCOUNTER — Encounter: Payer: Self-pay | Admitting: Family

## 2024-02-11 ENCOUNTER — Telehealth: Payer: Self-pay

## 2024-02-11 ENCOUNTER — Ambulatory Visit: Admitting: Physical Therapy

## 2024-02-11 DIAGNOSIS — M48062 Spinal stenosis, lumbar region with neurogenic claudication: Secondary | ICD-10-CM | POA: Diagnosis not present

## 2024-02-11 NOTE — Telephone Encounter (Signed)
 Patient notified , per pcp, we will need to have documentation of his oxygen level being under 90%. His last level was 97% and he reports at home is also above 90 %

## 2024-02-11 NOTE — Telephone Encounter (Signed)
 Copied from CRM [id number]. Topic: Referral - Prior Authorization Question >> Feb 11, 2024  9:46 AM Darl Householder wrote: Reason for CRM: Patient advise he spoken with insurance, insurance will approve portable oxygen tank but is needing prior auth from pcp.

## 2024-02-12 ENCOUNTER — Telehealth: Payer: Self-pay

## 2024-02-12 NOTE — Telephone Encounter (Signed)
 Pharmacy please advise on holding Xarelto prior to minimally invasive lumbar decompression scheduled for TBD. Thank you.

## 2024-02-12 NOTE — Telephone Encounter (Signed)
   Pre-operative Risk Assessment    Patient Name: Robert Bates  DOB: 08-Apr-1941 MRN: [id number]   Date of last office visit: 08/04/23 Kristeen Miss, MD Date of next office visit: 04/26/24 Kristeen Miss, MD   Request for Surgical Clearance    Procedure:   MILD PROCEDURE  Date of Surgery:  Clearance TBD                                Surgeon:  DR Aileen Fass Surgeon's Group or Practice Name:  [facility] Phone number:  [phone] Fax number:  [phone]   Type of Clearance Requested:   - Medical  - Pharmacy:  Hold Rivaroxaban (Xarelto) 3 DAYS PRIOR RESUME DAY AFTER PROCEDURE   Type of Anesthesia:   MOD CONT SED   Additional requests/questions:    Signed, Marlow Baars   02/12/2024, 3:41 PM

## 2024-02-13 NOTE — Telephone Encounter (Signed)
 Pt has been scheduled in office appt 03/05/24 at our new office location [address]. Pt aware of address. Micah Flesher, [facility] 03/05/24 @ 1:55.

## 2024-02-13 NOTE — Telephone Encounter (Signed)
 I called surgeon office to confirm procedure, as it is noted as mild procedure. Left vm to call back to our preop team with procedure information.   I will fax to surgery scheduler Bianca.

## 2024-02-13 NOTE — Telephone Encounter (Signed)
 Patient with diagnosis of atrial fibrillation on Xarelto for anticoagulation.    Procedure:   MILD PROCEDURE   Date of Surgery:  Clearance TBD   CHA2DS2-VASc Score = 6   This indicates a 9.7% annual risk of stroke. The patient's score is based upon: CHF History: 1 HTN History: 1 Diabetes History: 1 Stroke History: 0 Vascular Disease History: 1 Age Score: 2 Gender Score: 0    CrCl 64 Platelet count 230  Per office protocol, patient can hold Xarelto for 3 days prior to procedure.   Patient will not need bridging with Lovenox (enoxaparin) around procedure.  **This guidance is not considered finalized until pre-operative APP has relayed final recommendations.**

## 2024-02-13 NOTE — Telephone Encounter (Signed)
   Name: Robert Bates  DOB: October 16, 1941  MRN: [id number]  Primary Cardiologist: Kristeen Miss, MD  Chart reviewed as part of pre-operative protocol coverage. Because of [name]'s past medical history and time since last visit, he will require a follow-up in-office visit in order to better assess preoperative cardiovascular risk.  Pre-op covering staff: - Please schedule appointment and call patient to inform them. If patient already had an upcoming appointment within acceptable timeframe, please add "pre-op clearance" to the appointment notes so provider is aware. - Please contact requesting surgeon's office via preferred method (i.e, phone, fax) to inform them of need for appointment prior to surgery.  Per office protocol, patient can hold Xarelto for 3 days prior to procedure.   Patient will not need bridging with Lovenox (enoxaparin) around procedure.  Napoleon Form, Leodis Rains, NP  02/13/2024, 11:47 AM

## 2024-02-16 NOTE — Telephone Encounter (Signed)
 Robert Bates with Dr. Oral Billings office call back and confirmed procedure.   ADDENDUM TO CLEARANCE REQUEST:  PROCEDURE: L3-L5 LUMBAR DECOMPRESSION  I did inform Robert Bates the pt has a follow up appt with [name], [facility] 03/05/24 which preop clearance will be addressed at that time per the preop APP.   Once the pt has been cleared PAC will fax her notes to Dr. Crecencio Dodge with any medication recommendations.

## 2024-02-18 ENCOUNTER — Other Ambulatory Visit (HOSPITAL_BASED_OUTPATIENT_CLINIC_OR_DEPARTMENT_OTHER): Payer: Self-pay

## 2024-02-18 ENCOUNTER — Other Ambulatory Visit: Payer: Self-pay | Admitting: Medical Oncology

## 2024-02-18 ENCOUNTER — Telehealth: Payer: Self-pay | Admitting: Medical Oncology

## 2024-02-18 ENCOUNTER — Inpatient Hospital Stay

## 2024-02-18 DIAGNOSIS — C3490 Malignant neoplasm of unspecified part of unspecified bronchus or lung: Secondary | ICD-10-CM

## 2024-02-18 NOTE — Telephone Encounter (Signed)
 Pt cancelled lab appt today due to "I am down on my back". He wants to r/s his labs to May 1st before his appt with [facility].   Pt allergic to iodine .We reviewed the premeds with  the prednisone instructions  to take before his CT scan . He  wants the  CT without contrast  and to schedule it at [facility].  Orders changed.

## 2024-02-19 ENCOUNTER — Other Ambulatory Visit (HOSPITAL_BASED_OUTPATIENT_CLINIC_OR_DEPARTMENT_OTHER): Payer: Self-pay

## 2024-02-19 ENCOUNTER — Telehealth: Payer: Self-pay

## 2024-02-19 MED ORDER — ONDANSETRON HCL 4 MG PO TABS
4.0000 mg | ORAL_TABLET | Freq: Three times a day (TID) | ORAL | 1 refills | Status: DC | PRN
  Filled 2024-02-19: qty 30, 10d supply, fill #0
  Filled 2024-03-15: qty 30, 10d supply, fill #1

## 2024-02-19 NOTE — Telephone Encounter (Signed)
 Rx sent.  Pt notified.

## 2024-02-19 NOTE — Telephone Encounter (Signed)
 Copied from CRM [id number]. Topic: Clinical - Medication Question >> Feb 19, 2024  1:49 PM Adaysia C wrote: Reason for CRM: Patient has asked if his PCP can prescribe a medication to help with the nausea symptoms that comes from taking his pain medication; please follow up with patient about this medication [phone] Patient preferred pharmacy: [facility] [place] [facility] [address], [address], [place] [address] Phone: [phone]  Fax: [phone]

## 2024-02-22 DIAGNOSIS — J441 Chronic obstructive pulmonary disease with (acute) exacerbation: Secondary | ICD-10-CM | POA: Diagnosis not present

## 2024-02-22 DIAGNOSIS — I499 Cardiac arrhythmia, unspecified: Secondary | ICD-10-CM | POA: Diagnosis not present

## 2024-02-22 DIAGNOSIS — R0602 Shortness of breath: Secondary | ICD-10-CM | POA: Diagnosis not present

## 2024-02-22 DIAGNOSIS — E119 Type 2 diabetes mellitus without complications: Secondary | ICD-10-CM | POA: Diagnosis not present

## 2024-02-22 DIAGNOSIS — Z87891 Personal history of nicotine dependence: Secondary | ICD-10-CM | POA: Diagnosis not present

## 2024-02-22 DIAGNOSIS — R600 Localized edema: Secondary | ICD-10-CM | POA: Diagnosis not present

## 2024-02-22 DIAGNOSIS — R000 Tachycardia, unspecified: Secondary | ICD-10-CM | POA: Diagnosis not present

## 2024-02-22 DIAGNOSIS — R069 Unspecified abnormalities of breathing: Secondary | ICD-10-CM | POA: Diagnosis not present

## 2024-02-23 DIAGNOSIS — I071 Rheumatic tricuspid insufficiency: Secondary | ICD-10-CM | POA: Diagnosis not present

## 2024-02-23 DIAGNOSIS — R600 Localized edema: Secondary | ICD-10-CM | POA: Diagnosis not present

## 2024-02-23 DIAGNOSIS — J441 Chronic obstructive pulmonary disease with (acute) exacerbation: Secondary | ICD-10-CM | POA: Diagnosis not present

## 2024-02-23 DIAGNOSIS — I444 Left anterior fascicular block: Secondary | ICD-10-CM | POA: Diagnosis not present

## 2024-02-24 ENCOUNTER — Other Ambulatory Visit: Payer: Self-pay

## 2024-02-24 ENCOUNTER — Other Ambulatory Visit (HOSPITAL_BASED_OUTPATIENT_CLINIC_OR_DEPARTMENT_OTHER): Payer: Self-pay

## 2024-02-24 ENCOUNTER — Telehealth: Payer: Self-pay

## 2024-02-24 ENCOUNTER — Ambulatory Visit (HOSPITAL_BASED_OUTPATIENT_CLINIC_OR_DEPARTMENT_OTHER)
Admission: RE | Admit: 2024-02-24 | Discharge: 2024-02-24 | Disposition: A | Discharge status: Home / Self Care | Source: Ambulatory Visit | Attending: Internal Medicine | Admitting: Internal Medicine

## 2024-02-24 DIAGNOSIS — J449 Chronic obstructive pulmonary disease, unspecified: Secondary | ICD-10-CM

## 2024-02-24 DIAGNOSIS — J441 Chronic obstructive pulmonary disease with (acute) exacerbation: Secondary | ICD-10-CM

## 2024-02-24 DIAGNOSIS — C3490 Malignant neoplasm of unspecified part of unspecified bronchus or lung: Secondary | ICD-10-CM | POA: Insufficient documentation

## 2024-02-24 DIAGNOSIS — D3502 Benign neoplasm of left adrenal gland: Secondary | ICD-10-CM | POA: Diagnosis not present

## 2024-02-24 DIAGNOSIS — J439 Emphysema, unspecified: Secondary | ICD-10-CM | POA: Diagnosis not present

## 2024-02-24 DIAGNOSIS — I700 Atherosclerosis of aorta: Secondary | ICD-10-CM | POA: Diagnosis not present

## 2024-02-24 MED ORDER — BENZONATATE 100 MG PO CAPS
100.0000 mg | ORAL_CAPSULE | Freq: Three times a day (TID) | ORAL | 0 refills | Status: DC | PRN
  Filled 2024-02-24: qty 30, 10d supply, fill #0

## 2024-02-24 NOTE — Addendum Note (Signed)
 Addended by: [name] on: 02/24/2024 01:13 PM   Modules accepted: Orders

## 2024-02-24 NOTE — Telephone Encounter (Signed)
 DME order entered in system for nebulizer machine and community message sent to advance home care representatives for processing.  Patient reports will pick up his cough medication, and he will cal for appointment if not better soon.

## 2024-02-24 NOTE — Telephone Encounter (Signed)
 I sent an rx for tessalon - which helps with cough. And hand wrote rx for nebulizer with tubing.  Please advise pt that if his breathing has worsened he should be seen.

## 2024-02-24 NOTE — Telephone Encounter (Signed)
 Copied from CRM [id number]. Topic: Clinical - Medication Question >> Feb 24, 2024 12:18 PM Adaysia C wrote: Reason for CRM: Patient has asked if the provider can prescribe a nebulizer for him to pick up because he lost the one he was using; patient has also asked if the provider can prescribe  Tessalon  Perles and pills that will help him breath better; please follow up with patient regarding these requests [phone]

## 2024-02-25 ENCOUNTER — Ambulatory Visit (INDEPENDENT_AMBULATORY_CARE_PROVIDER_SITE_OTHER): Admitting: Pharmacist

## 2024-02-25 ENCOUNTER — Other Ambulatory Visit (HOSPITAL_BASED_OUTPATIENT_CLINIC_OR_DEPARTMENT_OTHER): Payer: Self-pay

## 2024-02-25 DIAGNOSIS — J449 Chronic obstructive pulmonary disease, unspecified: Secondary | ICD-10-CM

## 2024-02-25 DIAGNOSIS — I5032 Chronic diastolic (congestive) heart failure: Secondary | ICD-10-CM

## 2024-02-25 DIAGNOSIS — Z79899 Other long term (current) drug therapy: Secondary | ICD-10-CM

## 2024-02-25 MED ORDER — SPIRONOLACTONE 50 MG PO TABS
50.0000 mg | ORAL_TABLET | Freq: Every day | ORAL | 0 refills | Status: DC
  Filled 2024-02-25: qty 90, 90d supply, fill #0

## 2024-02-25 MED ORDER — POTASSIUM CHLORIDE ER 10 MEQ PO TBCR
10.0000 meq | EXTENDED_RELEASE_TABLET | Freq: Every day | ORAL | 0 refills | Status: DC
Start: 2024-02-25 — End: 2024-06-10
  Filled 2024-02-25: qty 1, 1d supply, fill #0
  Filled 2024-02-25: qty 89, 89d supply, fill #0

## 2024-02-25 MED ORDER — FUROSEMIDE 40 MG PO TABS
40.0000 mg | ORAL_TABLET | Freq: Every day | ORAL | 0 refills | Status: DC
  Filled 2024-02-25: qty 90, 90d supply, fill #0

## 2024-02-25 NOTE — Progress Notes (Signed)
 11/10/2023 Name: Robert Bates MRN: [id number] DOB: 11-13-1940  Chief Complaint  Patient presents with   Medication Management   COPD   Congestive Heart Failure    Robert Bates is a 83 y.o. year old male who presented for a telephone visit today   They were referred to the pharmacist by their PCP for assistance in managing complex medication management.   Subjective: Patient was recently hospitalized 02/22/2024 to 02/23/2024 at [facility] for COPD / CHF exacerbation.  BNP was 109 (H). During hospitalization he was given budesonide , ipratropium+albuterol  and formoterol  20mcg nebs, dexamethasone  8mg  x 1 dose, furosemide  IV 40mg  x 1 dose and 40mg  oral on Monday 4/21.  New Medication at Discharge - none noted per discharge summary.   Patient previously was in the care of [facility] from October 2024 until February 02, 2024.     Patient's niece is still helping with his medications and uses weekly medication reminder system.    Patient also mentions that he received red boxes of packaged medications - from [facility] from 02/03/2024 even though at our last office I called along with Robert Bates and he confirmed that he did not want them to continue to fill his medications.  They are only included these medications in [facility] packaging. - potassium, omeprazole , furosemide  and spironolactone  (only 4 of his 10 maintenance medications)   Patient reports affordability concerns with their medications: No   Patient reports access/transportation concerns to their pharmacy: No  Patient reports adherence concerns with their medications:  No   .     Objective:  Lab Results  Component Value Date   HGBA1C 6.2 01/28/2024    Lab Results  Component Value Date   CREATININE 1.05 08/14/2023   BUN 29 (H) 08/14/2023   NA 136 08/14/2023   K 4.6 08/14/2023   CL 96 08/14/2023   CO2 33 (H) 08/14/2023    Lab Results  Component Value Date   CHOL 142 08/07/2023    HDL 79 08/07/2023   LDLCALC 47 08/07/2023   LDLDIRECT 142.2 12/26/2008   TRIG 81 08/07/2023   CHOLHDL 1.8 08/07/2023    Medications Reviewed Today   Medications were not reviewed in this encounter       Assessment/Plan:   Medication Management: - Reviewed his medication list with patient. He will bring in all his medications to the office to review again 03/01/2024 - Instructed patient that he is supposed to be taking spironolactone  50mg  daily not 25mg  daily - Will coordinate getting spironolactone  50mg  from [facility]   - Assisting patient in getting all prescriptions back at [facility] at his request. The following Med refill sent to pharmacy.   Meds ordered this encounter  Medications   furosemide  (LASIX ) 40 MG tablet    Sig: Take 1 tablet (40 mg total) by mouth daily.    Dispense:  90 tablet    Refill:  0   potassium chloride  (KLOR-CON ) 10 MEQ tablet    Sig: Take 1 tablet (10 mEq total) by mouth daily.    Dispense:  90 tablet    Refill:  0   spironolactone  (ALDACTONE ) 50 MG tablet    Sig: Take 1 tablet (50 mg total) by mouth daily.    Dispense:  90 tablet    Refill:  0     - If we do packaging in the future, could consider something like this: Morning: amiodarone , glipizide , omeprazole  Afternoon: furosemide  (  patient preference to take in afternoon) Evening Meal: diltiazem , potassium, Xarelto , tamsulosin  Spironolactone  is not able to be included in packaging   Follow Up Plan: Monday, 03/01/2024  Robert Bates, PharmD Clinical Pharmacist [facility]

## 2024-02-26 ENCOUNTER — Ambulatory Visit (HOSPITAL_BASED_OUTPATIENT_CLINIC_OR_DEPARTMENT_OTHER)

## 2024-02-27 NOTE — Progress Notes (Signed)
 Cardiology Office Note:    Date:  03/05/2024   ID:  Robert Bates, DOB 02/02/1941, MRN [id number]  PCP:  [name], NP   [facility] Providers Cardiologist:  Ahmad Alert, MD Cardiology APP:  Sherwood Donath     Referring MD: [name], NP   Chief Complaint  Patient presents with   Pre-op Exam    History of Present Illness:    Robert Bates is a 83 y.o. 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  He had a radiation therapy for left lower lobe cancer.  He underwent CABG x 3 05/2021 with LIMA-LAD, SVG-OM, SVG-diagonal.  He also had left atrial appendage clipping.  Postop course complicated by A-fib with RVR and pleural effusion requiring thoracentesis x 2.  His atrial fibrillation has been managed with amiodarone  and anticoagulated with Xarelto .  He has chronic diastolic heart failure with preserved LVEF on last echo and grade 2 DD with moderate LAE, mild RAE, mild MR, mild to moderate TR.  Pericardial effusion without tamponade was also noted.  Since CABG, he has had issues with recurrent pleural effusions since 2022.  He was hospitalized 08/04/2023 with A-fib with RVR in the setting of COPD exacerbation and active wheezing.  He has not been seen back in the clinic since his discharge October 2024.  He is now pending minimally invasive lumbar decompression and presents for cardiac preoperative risk evaluation.  He has significant back pain, can only walk 5-10 steps. He denies resting chest pain, but has had lower extremity swelling. He has taken 40 mg lasix  daily and swelling just went down 2 days ago.   He was seen in the [facility] 02/22/24 with "fluid on my lungs." EDP concerned about COPD exacerbation. He uses vaping pen for nicotine , but does not smoke cigarettes. He does not drink alcohol any longer - has not drank in 40 years.    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   COPD (chronic obstructive pulmonary disease) (HCC)    Coronary artery disease    COVID-19 virus infection 04/23/2021   Diabetes type 2, controlled (HCC) 07/31/2017   Diverticulitis 07/29/2023   Dyspnea    Dysrhythmia    a fib   GERD (gastroesophageal reflux disease)    Hematuria    refuses work up or referral - understands risks of morbidity / mortality - 11/2008, 12/2008   Heme positive stool    History of hiatal hernia    History of kidney stones    Hyperlipemia    Meningioma (HCC) 10/25/2013   Follows with Dr. Audie Bleacher.    Peripheral vascular disease (HCC)    Abdominal Aortic Aneursym   Pneumonia    as a child   Radiation 09/18/15-10/25/15   left lower lobe 70.2 Gy   Seizures (HCC) 02/18/2020   Tobacco abuse     Past Surgical History:  Procedure Laterality Date   CHOLECYSTECTOMY N/A 07/23/2017   Procedure: LAPAROSCOPIC CHOLECYSTECTOMY;  Surgeon: Derral Flick, MD;  Location: [facility];  Service: General;  Laterality: N/A;   CLIPPING OF ATRIAL APPENDAGE Left 05/08/2021  Procedure: CLIPPING OF ATRIAL APPENDAGE USING 45 ATRICLIP;  Surgeon: Rudine Cos, MD;  Location: [facility] OR;  Service: Open Heart Surgery;  Laterality: Left;   COLONOSCOPY     CORONARY ARTERY BYPASS GRAFT N/A 05/08/2021   Procedure: CORONARY ARTERY BYPASS GRAFTING (CABG)X 3 USING LEFT INTERNAL MAMMARY ARTERY AND RIGHT GREATER SAPEHNOUS VEIN;  Surgeon: Rudine Cos, MD;  Location: [facility] OR;  Service: Open Heart Surgery;  Laterality: N/A;   ENDOVEIN HARVEST OF GREATER SAPHENOUS VEIN Right 05/08/2021   Procedure: ENDOVEIN HARVEST OF GREATER SAPHENOUS VEIN;  Surgeon: Rudine Cos, MD;  Location: [facility] OR;   Service: Open Heart Surgery;  Laterality: Right;   EYE SURGERY Bilateral    Cataracts removed w/ lens implant   HERNIA REPAIR     Left 36 years ago . Right inguinal hernia repair 10-01-17 Dr. Dorrie Gaudier   INGUINAL HERNIA REPAIR Right 10/01/2017   Procedure: RIGHT INGUINAL HERNIA REPAIR WITH MESH;  Surgeon: Kinsinger, Alphonso Aschoff, MD;  Location: [facility];  Service: General;  Laterality: Right;  TAP BLOCK   INSERTION OF MESH Right 10/01/2017   Procedure: INSERTION OF MESH;  Surgeon: Dorrie Gaudier Alphonso Aschoff, MD;  Location: [facility];  Service: General;  Laterality: Right;   IR THORACENTESIS ASP PLEURAL SPACE W/IMG GUIDE  05/18/2021   IR THORACENTESIS ASP PLEURAL SPACE W/IMG GUIDE  06/07/2021   LEFT HEART CATH AND CORONARY ANGIOGRAPHY N/A 04/20/2021   Procedure: LEFT HEART CATH AND CORONARY ANGIOGRAPHY;  Surgeon: Wenona Hamilton, MD;  [facility]: [facility];  Service: Cardiovascular;  Laterality: N/A;   TEE WITHOUT CARDIOVERSION N/A 05/08/2021   Procedure: TRANSESOPHAGEAL ECHOCARDIOGRAM (TEE);  Surgeon: Rudine Cos, MD;  Location: [facility] OR;  Service: Open Heart Surgery;  Laterality: N/A;   TONSILLECTOMY     TONSILLECTOMY     VIDEO BRONCHOSCOPY Bilateral 07/26/2015   Procedure: VIDEO BRONCHOSCOPY WITH FLUORO;  Surgeon: Diamond Formica, MD;  Location: [facility];  Service: Cardiopulmonary;  Laterality: Bilateral;   VIDEO BRONCHOSCOPY WITH ENDOBRONCHIAL NAVIGATION N/A 08/23/2015   Procedure: VIDEO BRONCHOSCOPY WITH ENDOBRONCHIAL NAVIGATION;  Surgeon: Norita Beauvais, MD;  Location: [facility] OR;  Service: Thoracic;  Laterality: N/A;   VIDEO BRONCHOSCOPY WITH ENDOBRONCHIAL ULTRASOUND N/A 08/23/2015   Procedure: VIDEO BRONCHOSCOPY WITH ENDOBRONCHIAL ULTRASOUND;  Surgeon: Norita Beauvais, MD;  Location: [facility] OR;  Service: Thoracic;  Laterality: N/A;    Current Medications: Current Meds  Medication Sig   acetaminophen  (TYLENOL ) 500 MG tablet Take 1-2 tablets (500-1,000 mg total) by mouth every 6 (six) hours  as needed.   amiodarone  (PACERONE ) 200 MG tablet Take 1 tablet (200 mg total) by mouth daily.   benzonatate  (TESSALON ) 100 MG capsule Take 1 capsule (100 mg total) by mouth 3 (three) times daily as needed.   Budeson-Glycopyrrol-Formoterol  (BREZTRI  AEROSPHERE) 160-9-4.8 MCG/ACT AERO Inhale 2 puffs into the lungs 2 (two) times daily.   diltiazem  (CARDIZEM  CD) 120 MG 24 hr capsule Take 1 capsule (120 mg total) by mouth daily.   furosemide  (LASIX ) 40 MG tablet Take 1 tablet (40 mg total) by mouth daily.   glipiZIDE  (GLUCOTROL  XL) 2.5 MG 24 hr tablet Take 1 tablet (2.5 mg total) by mouth daily with breakfast.   levalbuterol  (XOPENEX ) 0.63 MG/3ML nebulizer solution Take 3 mLs (0.63 mg total) by nebulization every 4 (four) hours as needed for wheezing or shortness of breath.   LORazepam  (ATIVAN ) 0.5 MG tablet Take 1 tablet (0.5 mg total) by mouth every 8 (eight) hours as needed for severe  anxiety/shortness of breath not relieved by roxanol   omeprazole  (PRILOSEC) 40 MG capsule Take 1 capsule (40 mg total) by mouth daily.   ondansetron  (ZOFRAN ) 4 MG tablet Take 1 tablet (4 mg total) by mouth every 8 (eight) hours as needed for nausea or vomiting.   potassium chloride  (KLOR-CON ) 10 MEQ tablet Take 1 tablet (10 mEq total) by mouth daily.   prochlorperazine  (COMPAZINE ) 10 MG tablet Take 10 mg by mouth every 6 (six) hours as needed.   rivaroxaban  (XARELTO ) 20 MG TABS tablet Take 1 tablet (20 mg total) by mouth every evening with supper.   senna (SENNA LAXATIVE) 8.6 MG tablet Take 1-4 tablets (8.6-34.4 mg total) by mouth 2 (two) times daily as needed for constipation   spironolactone  (ALDACTONE ) 50 MG tablet Take 1 tablet (50 mg total) by mouth daily.   tamsulosin  (FLOMAX ) 0.4 MG CAPS capsule Take 1 capsule (0.4 mg total) by mouth at bedtime.   traMADol  (ULTRAM ) 50 MG tablet Take 1 tablet (50 mg total) by mouth every 12 (twelve) hours as needed.     Allergies:   Iodine, Iohexol , Metformin  and related, and  Metformin    Social History   Socioeconomic History   Marital status: Widowed    Spouse name: Not on file   Number of children: 2   Years of education: Not on file   Highest education level: Not on file  Occupational History   Occupation: Retired    Associate Professor: [employer]    Comment: truck Air traffic controller: [employer]  Tobacco Use   Smoking status: Former    Current packs/day: 0.00    Average packs/day: 1 pack/day for 57.0 years (57.0 ttl pk-yrs)    Types: Cigarettes, Cigars    Start date: 08/07/1958    Quit date: 08/08/2015    Years since quitting: 8.5   Smokeless tobacco: Former    Types: Chew    Quit date: 11/04/1958   Tobacco comments:    Will smoke cigar every once in awhile.  Vaping daily started a couple weeks ago.  04/18/22 hfb    Patient states he is still vaping. AB, CMA 09-10-2023  Vaping Use   Vaping status: Some Days  Substance and Sexual Activity   Alcohol use: Not Currently    Alcohol/week: 0.0 standard drinks of alcohol   Drug use: No   Sexual activity: Not Currently  Other Topics Concern   Not on file  Social History Narrative   Not on file   Social Drivers of Health   Financial Resource Strain: Low Risk  (05/22/2023)   Overall Financial Resource Strain (CARDIA)    Difficulty of Paying Living Expenses: Not very hard  Food Insecurity: No Food Insecurity (08/14/2023)   Hunger Vital Sign    Worried About Running Out of Food in the Last Year: Never true    Ran Out of Food in the Last Year: Never true  Transportation Needs: No Transportation Needs (08/14/2023)   PRAPARE - Administrator, Civil Service (Medical): No    Lack of Transportation (Non-Medical): No  Physical Activity: Insufficiently Active (08/05/2021)   Exercise Vital Sign    Days of Exercise per Week: 2 days    Minutes of Exercise per Session: 40 min  Stress: Stress Concern Present (05/22/2023)   [employer] of Occupational Health - Occupational Stress Questionnaire     Feeling of Stress : Rather much  Social Connections: Moderately Isolated (05/13/2022)   Social Connection and Isolation Panel [NHANES]  Frequency of Communication with Friends and Family: Twice a week    Frequency of Social Gatherings with Friends and Family: Once a week    Attends Religious Services: Never    Database administrator or Organizations: No    Attends Engineer, structural: Never    Marital Status: Married     Family History: The patient's family history includes Atrial fibrillation in his son; Emphysema in his father; Learning disabilities in his son; Leukemia in his father and another family member; Stroke in an other family member.  ROS:   Please see the history of present illness.     All other systems reviewed and are negative.  EKGs/Labs/Other Studies Reviewed:    The following studies were reviewed today:  EKG Interpretation Date/Time:  Friday Mar 05 2024 13:53:42 EDT Ventricular Rate:  61 PR Interval:  120 QRS Duration:  106 QT Interval:  438 QTC Calculation: 440 R Axis:   -57  Text Interpretation: Normal sinus rhythm Left anterior fascicular block Cannot rule out Anterior infarct , age undetermined When compared with ECG of 08-Aug-2023 05:50, Nonspecific T wave abnormality no longer evident in Inferior leads Confirmed by [name] ([id number]) on 03/05/2024 2:18:18 PM    Recent Labs: 08/04/2023: B Natriuretic Peptide 134.1 08/08/2023: Magnesium  2.2; TSH 0.211 08/14/2023: ALT 27; BUN 29; Creatinine, Ser 1.05; Hemoglobin 13.7; Platelets 230.0; Potassium 4.6; Pro B Natriuretic peptide (BNP) 83.0; Sodium 136  Recent Lipid Panel    Component Value Date/Time   CHOL 142 08/07/2023 0404   CHOL 127 10/07/2022 1128   TRIG 81 08/07/2023 0404   HDL 79 08/07/2023 0404   HDL 64 10/07/2022 1128   CHOLHDL 1.8 08/07/2023 0404   VLDL 16 08/07/2023 0404   LDLCALC 47 08/07/2023 0404   LDLCALC 46 10/07/2022 1128   LDLCALC 62 04/11/2021 1421   LDLDIRECT 142.2  12/26/2008 1032     Risk Assessment/Calculations:    CHA2DS2-VASc Score = 6   This indicates a 9.7% annual risk of stroke. The patient's score is based upon: CHF History: 1 HTN History: 1 Diabetes History: 1 Stroke History: 0 Vascular Disease History: 1 Age Score: 2 Gender Score: 0            Physical Exam:    VS:  BP (!) 110/50   Pulse 61   Ht 5\' 7"  (1.702 m)   Wt 177 lb 3.2 oz (80.4 kg)   SpO2 94%   BMI 27.75 kg/m     Wt Readings from Last 3 Encounters:  03/05/24 177 lb 3.2 oz (80.4 kg)  02/06/24 185 lb (83.9 kg)  01/28/24 183 lb (83 kg)     GEN:  Well nourished, well developed in no acute distress HEENT: Normal NECK: No JVD; No carotid bruits LYMPHATICS: No lymphadenopathy CARDIAC: RRR, no murmurs, rubs, gallops RESPIRATORY:  wheezing throughout  ABDOMEN: Soft, non-tender, non-distended MUSCULOSKELETAL:  mild B LE edema R > L (hx of SVG from right) SKIN: Warm and dry NEUROLOGIC:  Alert and oriented x 3 PSYCHIATRIC:  Normal affect   ASSESSMENT:    1. Chronic heart failure with preserved ejection fraction (HCC)   2. Preoperative cardiovascular examination   3. Chronic anticoagulation   4. Hyperlipidemia LDL goal <70   5. Essential hypertension   6. Persistent atrial fibrillation (HCC)   7. Hx of CABG    PLAN:    In order of problems listed above:   CAD s/p CABG x 3 05/2021 - Has not had an  ischemic evaluation since CABG in 2022 -No aspirin  given need for Xarelto  He is on Cardizem  120 mg for management of his A-fib, continue 50 mg spironolactone    Chronic diastolic heart failure Grade 2 DD on last echo - Maintained on 50 mg spironolactone , 40 mg Lasix  daily -- has lower extremity swelling - will obtain updated echo   Persistent atrial fibrillation - Maintained on 200 mg amiodarone  daily and 120 mg Cardizem  -- in sinus rhythm today   Chronic anticoagulation - Doing well on 20 mg Xarelto , no bleeding issues   Preoperative risk  evaluation for Mace prior to lumbar decompression Per the RCRI, he has a 6.6% risk of Mace for any procedure. Surgeon recommends against general anesthesia.   He can't complete 4.0 METS, no resting pain. Will obtain echo and nuclear stress test for risk stratification. He understands that if the nuclear stress test is significantly abnormal, we may need to do a diagnostic cath to ensure no high grade stenosis. If stable, may proceed with back surgery.   Per our clinical pharmacist, he may hold Xarelto  CHA2DS2-VASc Score = 6   This indicates a 9.7% annual risk of stroke. The patient's score is based upon: CHF History: 1 HTN History: 1 Diabetes History: 1 Stroke History: 0 Vascular Disease History: 1 Age Score: 2 Gender Score: 0   Per office protocol, patient can hold Xarelto  for 3 days prior to procedure.   Patient will not need bridging with Lovenox  (enoxaparin ) around procedure.   Follow up in 6 months.       Informed Consent   Shared Decision Making/Informed Consent The risks [chest pain, shortness of breath, cardiac arrhythmias, dizziness, blood pressure fluctuations, myocardial infarction, stroke/transient ischemic attack, nausea, vomiting, allergic reaction, radiation exposure, metallic taste sensation and life-threatening complications (estimated to be 1 in 10,000)], benefits (risk stratification, diagnosing coronary artery disease, treatment guidance) and alternatives of a nuclear stress test were discussed in detail with Robert Bates and he agrees to proceed.       Medication Adjustments/Labs and Tests Ordered: Current medicines are reviewed at length with the patient today.  Concerns regarding medicines are outlined above.  Orders Placed This Encounter  Procedures   Cardiac Stress Test: Informed Consent Details: Physician/Practitioner Attestation; Transcribe to consent form and obtain patient signature   MYOCARDIAL PERFUSION IMAGING   EKG 12-Lead   ECHOCARDIOGRAM COMPLETE    No orders of the defined types were placed in this encounter.   Patient Instructions  Medication Instructions:  No changes *If you need a refill on your cardiac medications before your next appointment, please call your pharmacy*  Lab Work: No labs  Testing/Procedures: Your physician has requested that you have an echocardiogram. Echocardiography is a painless test that uses sound waves to create images of your heart. It provides your doctor with information about the size and shape of your heart and how well your heart's chambers and valves are working. This procedure takes approximately one hour. There are no restrictions for this procedure. Please do NOT wear cologne, perfume, aftershave, or lotions (deodorant is allowed). Please arrive 15 minutes prior to your appointment time.  Please note: We ask at that you not bring children with you during ultrasound (echo/ vascular) testing. Due to room size and safety concerns, children are not allowed in the ultrasound rooms during exams. Our front office staff cannot provide observation of children in our lobby area while testing is being conducted. An adult accompanying a patient to their appointment will only be  allowed in the ultrasound room at the discretion of the ultrasound technician under special circumstances. We apologize for any inconvenience.   Please arrive 15 minutes prior to your appointment time for registration and insurance purposes.   The test will take approximately 3 to 4 hours to complete; you may bring reading material.  If someone comes with you to your appointment, they will need to remain in the main lobby due to limited space in the testing area. **If you are pregnant or breastfeeding, please notify the nuclear lab prior to your appointment**   How to prepare for your Myocardial Perfusion Test: Do not eat or drink 3 hours prior to your test, except you may have water . Do not consume products containing caffeine  (regular or decaffeinated) 12 hours prior to your test. (ex: coffee, chocolate, sodas, tea). Do wear comfortable clothes (no dresses or overalls) and walking shoes, tennis shoes preferred (No heels or open toe shoes are allowed). Do NOT wear cologne, perfume, aftershave, or lotions (deodorant is allowed). If you use an inhaler, use it the AM of your test and bring it with you.  If you use a nebulizer, use it the AM of your test.  If these instructions are not followed, your test will have to be rescheduled.  Follow-Up: At [facility], you and your health needs are our priority.  As part of our continuing mission to provide you with exceptional heart care, our providers are all part of one team.  This team includes your primary Cardiologist (physician) and Advanced Practice Providers or APPs (Physician Assistants and Nurse Practitioners) who all work together to provide you with the care you need, when you need it.  Your next appointment:   6 month(s)  Provider:   Ahmad Alert, MD or Marlyse Single, PA-C     We recommend signing up for the patient portal called "MyChart".  Sign up information is provided on this After Visit Summary.  MyChart is used to connect with patients for Virtual Visits (Telemedicine).  Patients are able to view lab/test results, encounter notes, upcoming appointments, etc.  Non-urgent messages can be sent to your provider as well.   To learn more about what you can do with MyChart, go to [email or web address].     Signed, Lamond Pilot, PA  03/05/2024 3:11 PM    [facility]

## 2024-03-01 ENCOUNTER — Ambulatory Visit

## 2024-03-02 ENCOUNTER — Ambulatory Visit: Admitting: Family

## 2024-03-03 ENCOUNTER — Ambulatory Visit

## 2024-03-04 ENCOUNTER — Inpatient Hospital Stay: Attending: Internal Medicine | Admitting: Internal Medicine

## 2024-03-04 ENCOUNTER — Telehealth: Payer: Self-pay | Admitting: Internal Medicine

## 2024-03-04 DIAGNOSIS — C3490 Malignant neoplasm of unspecified part of unspecified bronchus or lung: Secondary | ICD-10-CM

## 2024-03-04 NOTE — Progress Notes (Signed)
 [facility] [phone]   Fax:([phone]) [phone]  PROGRESS NOTE FOR TELEMEDICINE VISITS  [name], NP [address] [place] [address]  I connected withNAME@ on 03/04/24 at  9:30 AM EDT by telephone visit and verified that I am speaking with the correct person using two identifiers.   I discussed the limitations, risks, security and privacy concerns of performing an evaluation and management service by telemedicine and the availability of in-person appointments. I also discussed with the patient that there may be a patient responsible charge related to this service. The patient expressed understanding and agreed to proceed.  Other persons participating in the visit and their role in the encounter:  None  Patient's location:  Home Provider's location: [facility]  DIAGNOSIS: Stage IIB (T3, N0, M0) non-small cell lung cancer, squamous cell carcinoma presented with left lower lobe endobronchial lesion as well as suspicious groundglass opacity in the left upper lobe diagnosed in September 2016.   PRIOR THERAPY: Curative radiotherapy to the left upper lobe lung mass under the care of Dr. Eloise Hake completed 10/25/2015.   CURRENT THERAPY: Observation  INTERVAL HISTORY: Robert Bates 83 y.o. male has a MyChart virtual telephone visit with me today for evaluation and discussion of his scan results.Discussed the use of AI scribe software for clinical note transcription with the patient, who gave verbal consent to proceed.  History of Present Illness   Robert Bates is an 84 year old male with stage IIB non-small cell lung cancer who presents for routine follow-up after curative radiotherapy.  He was diagnosed with stage IIB non-small cell lung cancer, squamous cell carcinoma, in September 2016. He underwent curative radiotherapy to the left upper lobe lung mass and has been under observation since then. A recent chest scan performed last  week showed no evidence of cancer recurrence or metastasis, only radiation changes that were previously noted.  He feels 'pretty good' overall and has not experienced any new symptoms related to his cancer. He mentions fluid buildup in his feet, which has decreased over the last few days. He has been taking diuretics to manage this symptom. No new symptoms related to his cancer.       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   COPD (chronic obstructive pulmonary disease) (HCC)    Coronary artery disease    COVID-19 virus infection 04/23/2021   Diabetes type 2, controlled (HCC) 07/31/2017   Diverticulitis 07/29/2023   Dyspnea    Dysrhythmia    a fib   GERD (gastroesophageal reflux disease)    Hematuria    refuses work up or referral - understands risks of morbidity / mortality - 11/2008, 12/2008   Heme positive stool    History of hiatal hernia    History of kidney stones    Hyperlipemia    Meningioma (HCC) 10/25/2013   Follows with Dr. Audie Bleacher.    Peripheral vascular disease (HCC)    Abdominal Aortic Aneursym   Pneumonia    as a child   Radiation 09/18/15-10/25/15   left lower lobe 70.2 Gy   Seizures (HCC) 02/18/2020   Tobacco abuse     ALLERGIES:  is allergic  to iodine, iohexol , and metformin  and related.  MEDICATIONS:  Current Outpatient Medications  Medication Sig Dispense Refill   acetaminophen  (TYLENOL ) 500 MG tablet Take 1-2 tablets (500-1,000 mg total) by mouth every 6 (six) hours as needed. (Patient taking differently: Take 1,000 mg by mouth daily as needed for headache, fever or moderate pain (pain score 4-6).) 30 tablet 0   amiodarone  (PACERONE ) 200 MG tablet Take 1 tablet (200 mg  total) by mouth daily. 90 tablet 3   benzonatate  (TESSALON ) 100 MG capsule Take 1 capsule (100 mg total) by mouth 3 (three) times daily as needed. 30 capsule 0   Budeson-Glycopyrrol-Formoterol  (BREZTRI  AEROSPHERE) 160-9-4.8 MCG/ACT AERO Inhale 2 puffs into the lungs 2 (two) times daily.     diltiazem  (CARDIZEM  CD) 120 MG 24 hr capsule Take 1 capsule (120 mg total) by mouth daily. 90 capsule 1   furosemide  (LASIX ) 40 MG tablet Take 1 tablet (40 mg total) by mouth daily. 90 tablet 0   glipiZIDE  (GLUCOTROL  XL) 2.5 MG 24 hr tablet Take 1 tablet (2.5 mg total) by mouth daily with breakfast. 90 tablet 0   levalbuterol  (XOPENEX ) 0.63 MG/3ML nebulizer solution Take 3 mLs (0.63 mg total) by nebulization every 4 (four) hours as needed for wheezing or shortness of breath. 75 mL 12   LORazepam  (ATIVAN ) 0.5 MG tablet Take 1 tablet (0.5 mg total) by mouth every 8 (eight) hours as needed for severe anxiety/shortness of breath not relieved by roxanol 6 tablet 0   omeprazole  (PRILOSEC) 40 MG capsule Take 1 capsule (40 mg total) by mouth daily. 90 capsule 1   ondansetron  (ZOFRAN ) 4 MG tablet Take 1 tablet (4 mg total) by mouth every 8 (eight) hours as needed for nausea or vomiting. 30 tablet 1   potassium chloride  (KLOR-CON ) 10 MEQ tablet Take 1 tablet (10 mEq total) by mouth daily. 90 tablet 0   prochlorperazine  (COMPAZINE ) 10 MG tablet Take 10 mg by mouth every 6 (six) hours as needed.     rivaroxaban  (XARELTO ) 20 MG TABS tablet Take 1 tablet (20 mg total) by mouth every evening with supper. 90 tablet 1   senna (SENNA LAXATIVE) 8.6 MG tablet Take 1-4 tablets (8.6-34.4 mg total) by mouth 2 (two) times daily as needed for constipation 100 tablet 0   spironolactone  (ALDACTONE ) 50 MG tablet Take 1 tablet (50 mg total) by mouth daily. 90 tablet 0   tamsulosin  (FLOMAX ) 0.4 MG CAPS capsule Take 1 capsule (0.4 mg total) by mouth at bedtime. 90 capsule 1   traMADol  (ULTRAM ) 50 MG tablet Take 1 tablet (50 mg total) by mouth  every 12 (twelve) hours as needed. 60 tablet 0   No current facility-administered medications for this visit.    SURGICAL HISTORY:  Past Surgical History:  Procedure Laterality Date   CHOLECYSTECTOMY N/A 07/23/2017   Procedure: LAPAROSCOPIC CHOLECYSTECTOMY;  Surgeon: Kinsinger, Alphonso Aschoff, MD;  Location: [facility];  Service: General;  Laterality: N/A;   CLIPPING OF ATRIAL APPENDAGE Left 05/08/2021   Procedure: CLIPPING OF ATRIAL APPENDAGE USING 45 ATRICLIP;  Surgeon: Rudine Cos, MD;  Location: [facility];  Service: Open Heart Surgery;  Laterality: Left;   COLONOSCOPY     CORONARY ARTERY BYPASS GRAFT N/A 05/08/2021   Procedure: CORONARY ARTERY BYPASS GRAFTING (CABG)X 3 USING LEFT INTERNAL MAMMARY ARTERY AND RIGHT GREATER SAPEHNOUS VEIN;  Surgeon: Rudine Cos, MD;  Location: [facility];  Service: Open Heart Surgery;  Laterality: N/A;   ENDOVEIN HARVEST OF GREATER SAPHENOUS VEIN Right  05/08/2021   Procedure: ENDOVEIN HARVEST OF GREATER SAPHENOUS VEIN;  Surgeon: Rudine Cos, MD;  Location: [facility];  Service: Open Heart Surgery;  Laterality: Right;   EYE SURGERY Bilateral    Cataracts removed w/ lens implant   HERNIA REPAIR     Left 36 years ago . Right inguinal hernia repair 10-01-17 Dr. Dorrie Gaudier   INGUINAL HERNIA REPAIR Right 10/01/2017   Procedure: RIGHT INGUINAL HERNIA REPAIR WITH MESH;  Surgeon: Kinsinger, Alphonso Aschoff, MD;  Location: [facility];  Service: General;  Laterality: Right;  TAP BLOCK   INSERTION OF MESH Right 10/01/2017   Procedure: INSERTION OF MESH;  Surgeon: Dorrie Gaudier Alphonso Aschoff, MD;  Location: [facility];  Service: General;  Laterality: Right;   IR THORACENTESIS ASP PLEURAL SPACE W/IMG GUIDE  05/18/2021   IR THORACENTESIS ASP PLEURAL SPACE W/IMG GUIDE  06/07/2021   LEFT HEART CATH AND CORONARY ANGIOGRAPHY N/A 04/20/2021   Procedure: LEFT HEART CATH AND CORONARY ANGIOGRAPHY;  Surgeon: Wenona Hamilton, MD;  Location: [facility];  Service: Cardiovascular;  Laterality: N/A;   TEE  WITHOUT CARDIOVERSION N/A 05/08/2021   Procedure: TRANSESOPHAGEAL ECHOCARDIOGRAM (TEE);  Surgeon: Rudine Cos, MD;  Location: [facility] OR;  Service: Open Heart Surgery;  Laterality: N/A;   TONSILLECTOMY     TONSILLECTOMY     VIDEO BRONCHOSCOPY Bilateral 07/26/2015   Procedure: VIDEO BRONCHOSCOPY WITH FLUORO;  Surgeon: Diamond Formica, MD;  Location: [facility];  Service: Cardiopulmonary;  Laterality: Bilateral;   VIDEO BRONCHOSCOPY WITH ENDOBRONCHIAL NAVIGATION N/A 08/23/2015   Procedure: VIDEO BRONCHOSCOPY WITH ENDOBRONCHIAL NAVIGATION;  Surgeon: Norita Beauvais, MD;  Location: [facility];  Service: Thoracic;  Laterality: N/A;   VIDEO BRONCHOSCOPY WITH ENDOBRONCHIAL ULTRASOUND N/A 08/23/2015   Procedure: VIDEO BRONCHOSCOPY WITH ENDOBRONCHIAL ULTRASOUND;  Surgeon: Norita Beauvais, MD;  Location: [facility];  Service: Thoracic;  Laterality: N/A;    REVIEW OF SYSTEMS:  A comprehensive review of systems was negative except for: Constitutional: positive for fatigue Respiratory: positive for dyspnea on exertion    LABORATORY DATA: Lab Results  Component Value Date   WBC 14.0 (H) 08/14/2023   HGB 13.7 08/14/2023   HCT 42.6 08/14/2023   MCV 86.2 08/14/2023   PLT 230.0 08/14/2023      Chemistry      Component Value Date/Time   NA 136 08/14/2023 1203   NA 141 04/02/2023 1123   NA 140 06/17/2017 1315   K 4.6 08/14/2023 1203   K 4.5 06/17/2017 1315   CL 96 08/14/2023 1203   CO2 33 (H) 08/14/2023 1203   CO2 28 06/17/2017 1315   BUN 29 (H) 08/14/2023 1203   BUN 16 04/02/2023 1123   BUN 18.1 06/17/2017 1315   CREATININE 1.05 08/14/2023 1203   CREATININE 1.00 06/18/2023 1125   CREATININE 0.84 01/17/2023 1552   CREATININE 0.8 06/17/2017 1315      Component Value Date/Time   CALCIUM  9.3 08/14/2023 1203   CALCIUM  9.8 06/17/2017 1315   ALKPHOS 88 08/14/2023 1203   ALKPHOS 82 06/17/2017 1315   AST 15 08/14/2023 1203   AST 10 (L) 06/18/2023 1125   AST 16 06/17/2017 1315   ALT 27 08/14/2023  1203   ALT 10 06/18/2023 1125   ALT 10 06/17/2017 1315   BILITOT 0.6 08/14/2023 1203   BILITOT 0.3 06/18/2023 1125   BILITOT 0.47 06/17/2017 1315       RADIOGRAPHIC STUDIES: CT Chest Wo Contrast Result Date: 03/03/2024 CLINICAL DATA:  Restaging non-small cell lung  cancer. * Tracking Code: BO * EXAM: CT CHEST WITHOUT CONTRAST TECHNIQUE: Multidetector CT imaging of the chest was performed following the standard protocol without IV contrast. RADIATION DOSE REDUCTION: This exam was performed according to the departmental dose-optimization program which includes automated exposure control, adjustment of the mA and/or kV according to patient size and/or use of iterative reconstruction technique. COMPARISON:  Prior PET-CT 01/23/2023 and recent CT scan 01/21/2024 FINDINGS: Cardiovascular: The heart is normal in size. No pericardial effusion. Stable tortuosity, ectasia and calcification of the thoracic aorta. Stable coronary artery calcifications. Stable left atrial closure device. Stable moderate to large hiatal hernia. Mediastinum/Nodes: No enlarged mediastinal or axillary lymph nodes. Thyroid  gland, trachea, and esophagus demonstrate no significant findings. 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 I do not see any change since the prior examinations. Persistent moderate-sized left pleural effusion and left lower lobe scarring changes. Stable underlying emphysema and pulmonary scarring. No new pulmonary lesions or pulmonary nodules. Upper Abdomen: No significant upper abdominal findings. Stable vascular calcifications and cortical defect involving the upper pole region of the right kidney. 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 Stable small hepatic cysts. Musculoskeletal: No significant bony findings. Stable sclerotic T8 lesion. IMPRESSION: 1. 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 No findings suspicious for recurrent tumor. 2. Persistent moderate-sized left pleural effusion and left lower lobe scarring changes. 3. No pulmonary metastatic disease or upper abdominal metastatic disease. 4. Stable emphysematous changes and pulmonary scarring. 5. Stable sclerotic T8 lesion. 6. 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 Aortic Atherosclerosis (ICD10-I70.0) and Emphysema (ICD10-J43.9). Electronically Signed   By: Marrian Siva M.D.   On: 03/03/2024 19:51    ASSESSMENT AND PLAN: This is a very pleasant 83 years old white male with a stage IIB non-small cell lung cancer, squamous cell carcinoma presented with left upper lobe endobronchial lesion in addition to left upper lobe suspicious groundglass opacity diagnosed in September 2016. He is status post curative radiotherapy under the care of Dr. Eloise Hake. The patient is currently on observation. He had repeat CT scan of the chest performed recently.  I personally and independently reviewed the scan and discussed the result with the patient today. His scan showed no concerning finding for disease recurrence or metastasis.    Non-small cell lung cancer Stage IIb non-small cell lung cancer, squamous cell carcinoma, diagnosed in September 2016. Status post curative radiotherapy to the left upper lobe lung mass. Currently under observation since 2016. Recent chest scan shows no evidence of cancer recurrence or metastasis, only radiation changes. - Schedule follow-up in one year with chest scan.  Edema Reports fluid accumulation in feet, which has decreased over the last few days. Managed with diuretics prescribed by primary care and cardiology teams.   The patient was advised to call immediately if he has any other concerning symptoms in the interval.  I discussed the assessment and treatment plan with the patient. The patient was provided an opportunity to ask questions and all were  answered. The patient agreed with the plan and demonstrated an understanding of the instructions.   The patient was advised to call back or seek an in-person evaluation if the symptoms worsen or if the condition fails to improve as anticipated.  I provided 20 minutes of non face-to-face telephone visit time during this encounter, and > 50% was spent  counseling as documented under my assessment & plan.  Aurelio Blower, MD 03/04/2024 9:52 AM  Disclaimer: This note was dictated with voice recognition software. Similar sounding words can inadvertently be transcribed and may not be corrected upon review.

## 2024-03-04 NOTE — Telephone Encounter (Signed)
 Patient called to cancel his appointment this morning. We ended up deciding to change the visit type to a telephone visit instead of canceling. The patient is aware of the appointment changes.

## 2024-03-05 ENCOUNTER — Ambulatory Visit: Attending: Physician Assistant | Admitting: Physician Assistant

## 2024-03-05 ENCOUNTER — Encounter: Payer: Self-pay | Admitting: Physician Assistant

## 2024-03-05 VITALS — BP 110/50 | HR 61 | Ht 67.0 in | Wt 177.2 lb

## 2024-03-05 DIAGNOSIS — Z01810 Encounter for preprocedural cardiovascular examination: Secondary | ICD-10-CM

## 2024-03-05 DIAGNOSIS — Z7901 Long term (current) use of anticoagulants: Secondary | ICD-10-CM

## 2024-03-05 DIAGNOSIS — E785 Hyperlipidemia, unspecified: Secondary | ICD-10-CM

## 2024-03-05 DIAGNOSIS — I10 Essential (primary) hypertension: Secondary | ICD-10-CM

## 2024-03-05 DIAGNOSIS — Z951 Presence of aortocoronary bypass graft: Secondary | ICD-10-CM

## 2024-03-05 DIAGNOSIS — I5032 Chronic diastolic (congestive) heart failure: Secondary | ICD-10-CM | POA: Diagnosis not present

## 2024-03-05 DIAGNOSIS — I4819 Other persistent atrial fibrillation: Secondary | ICD-10-CM

## 2024-03-05 NOTE — Patient Instructions (Signed)
 Medication Instructions:  No changes *If you need a refill on your cardiac medications before your next appointment, please call your pharmacy*  Lab Work: No labs  Testing/Procedures: Your physician has requested that you have an echocardiogram. Echocardiography is a painless test that uses sound waves to create images of your heart. It provides your doctor with information about the size and shape of your heart and how well your heart's chambers and valves are working. This procedure takes approximately one hour. There are no restrictions for this procedure. Please do NOT wear cologne, perfume, aftershave, or lotions (deodorant is allowed). Please arrive 15 minutes prior to your appointment time.  Please note: We ask at that you not bring children with you during ultrasound (echo/ vascular) testing. Due to room size and safety concerns, children are not allowed in the ultrasound rooms during exams. Our front office staff cannot provide observation of children in our lobby area while testing is being conducted. An adult accompanying a patient to their appointment will only be allowed in the ultrasound room at the discretion of the ultrasound technician under special circumstances. We apologize for any inconvenience.   Please arrive 15 minutes prior to your appointment time for registration and insurance purposes.   The test will take approximately 3 to 4 hours to complete; you may bring reading material.  If someone comes with you to your appointment, they will need to remain in the main lobby due to limited space in the testing area. **If you are pregnant or breastfeeding, please notify the nuclear lab prior to your appointment**   How to prepare for your Myocardial Perfusion Test: Do not eat or drink 3 hours prior to your test, except you may have water . Do not consume products containing caffeine (regular or decaffeinated) 12 hours prior to your test. (ex: coffee, chocolate, sodas, tea). Do  wear comfortable clothes (no dresses or overalls) and walking shoes, tennis shoes preferred (No heels or open toe shoes are allowed). Do NOT wear cologne, perfume, aftershave, or lotions (deodorant is allowed). If you use an inhaler, use it the AM of your test and bring it with you.  If you use a nebulizer, use it the AM of your test.  If these instructions are not followed, your test will have to be rescheduled.  Follow-Up: At [facility], you and your health needs are our priority.  As part of our continuing mission to provide you with exceptional heart care, our providers are all part of one team.  This team includes your primary Cardiologist (physician) and Advanced Practice Providers or APPs (Physician Assistants and Nurse Practitioners) who all work together to provide you with the care you need, when you need it.  Your next appointment:   6 month(s)  Provider:   Ahmad Alert, MD or Marlyse Single, PA-C     We recommend signing up for the patient portal called "MyChart".  Sign up information is provided on this After Visit Summary.  MyChart is used to connect with patients for Virtual Visits (Telemedicine).  Patients are able to view lab/test results, encounter notes, upcoming appointments, etc.  Non-urgent messages can be sent to your provider as well.   To learn more about what you can do with MyChart, go to [email or web address].

## 2024-03-10 ENCOUNTER — Ambulatory Visit: Admitting: Family

## 2024-03-15 ENCOUNTER — Ambulatory Visit: Payer: Self-pay

## 2024-03-15 ENCOUNTER — Other Ambulatory Visit: Payer: Self-pay | Admitting: Family

## 2024-03-15 ENCOUNTER — Other Ambulatory Visit: Payer: Self-pay

## 2024-03-15 ENCOUNTER — Other Ambulatory Visit (HOSPITAL_COMMUNITY): Payer: Self-pay

## 2024-03-15 ENCOUNTER — Ambulatory Visit (INDEPENDENT_AMBULATORY_CARE_PROVIDER_SITE_OTHER): Admitting: Family Medicine

## 2024-03-15 ENCOUNTER — Other Ambulatory Visit (HOSPITAL_BASED_OUTPATIENT_CLINIC_OR_DEPARTMENT_OTHER): Payer: Self-pay

## 2024-03-15 VITALS — BP 132/74 | HR 74 | Temp 97.6°F | Resp 18 | Ht 67.0 in | Wt 180.2 lb

## 2024-03-15 DIAGNOSIS — J441 Chronic obstructive pulmonary disease with (acute) exacerbation: Secondary | ICD-10-CM | POA: Diagnosis not present

## 2024-03-15 DIAGNOSIS — H6123 Impacted cerumen, bilateral: Secondary | ICD-10-CM | POA: Diagnosis not present

## 2024-03-15 DIAGNOSIS — J029 Acute pharyngitis, unspecified: Secondary | ICD-10-CM | POA: Diagnosis not present

## 2024-03-15 MED ORDER — PREDNISONE 20 MG PO TABS
40.0000 mg | ORAL_TABLET | Freq: Every day | ORAL | 0 refills | Status: DC
  Filled 2024-03-15: qty 10, 5d supply, fill #0

## 2024-03-15 MED ORDER — DOXYCYCLINE HYCLATE 100 MG PO CAPS
100.0000 mg | ORAL_CAPSULE | Freq: Two times a day (BID) | ORAL | 0 refills | Status: DC
  Filled 2024-03-15: qty 20, 10d supply, fill #0

## 2024-03-15 NOTE — Patient Instructions (Signed)
 Good to see you today- I hope you are feeling better soon I think you have a COPD "exacerbation" which means it is acting up worse than normal We will treat you with doxycyline antibiotic and also prednisone .  Take the prednisone  for 3-5 days depending on how long it takes you to feel better   Please let us  know if you are not improving soon

## 2024-03-15 NOTE — Progress Notes (Unsigned)
 [facility] [address] [place], [place] [address] [phone] [id number]  Date:  03/15/2024   Name:  Robert Bates   DOB:  14-Jun-1941   MRN:  [id number]  PCP:  [name], NP    Chief Complaint: Sore Throat   History of Present Illness:  Robert Bates is a 83 y.o. very pleasant male patient who presents with the following:  Pt seen today with concern of a sore throat- pt of [name] Seen by cardiology last week for pre-op recheck:  Robert Bates is a 83 y.o. 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 He had a radiation therapy for left lower lobe cancer.  He underwent CABG x 3 05/2021 with LIMA-LAD, SVG-OM, SVG-diagonal.  He also had left atrial appendage clipping.  Postop course complicated by A-fib with RVR and pleural effusion requiring thoracentesis x 2.  His atrial fibrillation has been managed with amiodarone  and anticoagulated with Xarelto .  He has chronic diastolic heart failure with preserved LVEF on last echo and grade 2 DD with moderate LAE, mild RAE, mild MR, mild to moderate TR.  Pericardial effusion without tamponade was also noted.  Since CABG, he has had issues with recurrent pleural effusions since 2022. He was hospitalized 08/04/2023 with A-fib with RVR in the setting of COPD exacerbation and active wheezing. He has not been seen back in the clinic since his discharge October 2024.  He is now pending minimally invasive lumbar decompression and presents for cardiac preoperative risk evaluation.  Today is Monday- on Friday night pt noted a St and mild fever.  He started to feel better yesterday but then it came back  His had subjective fever- felt cold and hot but did not measure a temp He noted a cough and some wheezing. Pt states he used to have oxygen  at home but "they took it away," he now  used an inhaler and nebs as needed for his COPD  Body aches as well Throat is sore  No vomiting or diarrhea He did take some medication for pain that he had at home   Patient notes he has difficulty hearing, he would like to get his hearing aids fixed but first needs to have impacted cerumen cleaned out of his ears  Lab Results  Component Value Date   HGBA1C 6.2 01/28/2024    Patient Active Problem List   Diagnosis Date Noted   Urinary urgency 10/14/2023   Pure hypercholesterolemia 08/05/2023   Chronic atrial fibrillation with RVR (HCC) 07/29/2023   Exertional shortness of breath 04/23/2023   SOB (shortness of breath) 04/23/2023   CHF (congestive heart failure) (HCC) 04/22/2023   Phimosis 03/24/2023   Grief reaction 01/17/2023   Urinary retention 11/05/2022   Musculoskeletal pain 07/17/2022   Pleural effusion 05/20/2022   Lower urinary tract symptoms (LUTS) 03/15/2022   Thoracic radiculopathy 03/15/2022   Chronic heart failure with preserved ejection fraction (HFpEF) (HCC) 03/15/2022   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/15/2022   Unilateral inguinal hernia without obstruction or gangrene 01/15/2022   COPD exacerbation (HCC) 10/03/2021   Secondary hypercoagulable state (HCC) 05/28/2021   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   Coronary artery disease 05/08/2021   Erectile dysfunction 04/11/2021   Head trauma 03/07/2021   Left leg pain 04/07/2020   Benign prostatic hyperplasia with nocturia 03/08/2020   Seizures (HCC) 02/18/2020   Persistent  atrial fibrillation (HCC) 08/15/2017   Diabetes type 2, controlled (HCC) 07/31/2017   COPD GOLD II  04/03/2017   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) 09/06/2015   Lung cancer (HCC) 11/07/2014   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   Primary hypertension 04/29/2014   Meningioma (HCC) 10/25/2013   Low back pain 10/25/2013   Osteoarthritis 08/18/2012   KERATOSIS 10/09/2010   SCIATICA, RIGHT 10/09/2010   Hearing loss 05/21/2010    Hyperlipidemia LDL goal <70 05/14/2010   Leukocytosis 05/14/2010   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   RENAL CYST, RIGHT 02/05/2010   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HCC) 01/29/2010   Lipoma 11/17/2009   MICROSCOPIC HEMATURIA 08/11/2008   GERD 07/26/2008    Past Medical History:  Diagnosis Date   Anxiety    Aortic dilatation (HCC) 05/13/2022   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   COPD (chronic obstructive pulmonary disease) (HCC)    Coronary artery disease    COVID-19 virus infection 04/23/2021   Diabetes type 2, controlled (HCC) 07/31/2017   Diverticulitis 07/29/2023   Dyspnea    Dysrhythmia    a fib   GERD (gastroesophageal reflux disease)    Hematuria    refuses work up or referral - understands risks of morbidity / mortality - 11/2008, 12/2008   Heme positive stool    History of hiatal hernia    History of kidney stones    Hyperlipemia    Meningioma (HCC) 10/25/2013   Follows with Dr. Audie Bleacher.    Peripheral vascular disease (HCC)    Abdominal Aortic Aneursym   Pneumonia    as a child   Radiation 09/18/15-10/25/15   left lower lobe 70.2 Gy   Seizures (HCC) 02/18/2020   Tobacco abuse     Past Surgical History:  Procedure Laterality Date   CHOLECYSTECTOMY N/A 07/23/2017   Procedure: LAPAROSCOPIC CHOLECYSTECTOMY;  Surgeon: Derral Flick, MD;  Location: [facility];  Service: General;  Laterality: N/A;   CLIPPING OF ATRIAL APPENDAGE Left 05/08/2021   Procedure: CLIPPING OF ATRIAL APPENDAGE USING 45 ATRICLIP;  Surgeon: Rudine Cos, MD;  Location: [facility];  Service: Open Heart Surgery;  Laterality: Left;   COLONOSCOPY     CORONARY ARTERY BYPASS GRAFT N/A 05/08/2021   Procedure: CORONARY ARTERY BYPASS GRAFTING (CABG)X 3 USING LEFT INTERNAL MAMMARY ARTERY AND RIGHT GREATER SAPEHNOUS VEIN;   Surgeon: Rudine Cos, MD;  Location: [facility];  Service: Open Heart Surgery;  Laterality: N/A;   ENDOVEIN HARVEST OF GREATER SAPHENOUS VEIN Right 05/08/2021   Procedure: ENDOVEIN HARVEST OF GREATER SAPHENOUS VEIN;  Surgeon: Rudine Cos, MD;  Location: [facility];  Service: Open Heart Surgery;  Laterality: Right;   EYE SURGERY Bilateral    Cataracts removed w/ lens implant   HERNIA REPAIR     Left 36 years ago . Right inguinal hernia repair 10-01-17 Dr. Dorrie Gaudier   INGUINAL HERNIA REPAIR Right 10/01/2017   Procedure: RIGHT INGUINAL HERNIA REPAIR WITH MESH;  Surgeon: Kinsinger, Alphonso Aschoff, MD;  Location: [facility];  Service: General;  Laterality: Right;  TAP BLOCK   INSERTION OF MESH Right 10/01/2017   Procedure: INSERTION OF MESH;  Surgeon: Dorrie Gaudier Alphonso Aschoff, MD;  Location: [facility];  Service: General;  Laterality: Right;   IR THORACENTESIS ASP PLEURAL SPACE W/IMG GUIDE  05/18/2021   IR THORACENTESIS ASP PLEURAL SPACE W/IMG GUIDE  06/07/2021   LEFT HEART CATH AND CORONARY ANGIOGRAPHY N/A 04/20/2021   Procedure: LEFT HEART CATH AND CORONARY ANGIOGRAPHY;  Surgeon: Wenona Hamilton, MD;  Location: [facility];  Service: Cardiovascular;  Laterality: N/A;   TEE WITHOUT CARDIOVERSION N/A 05/08/2021   Procedure: TRANSESOPHAGEAL ECHOCARDIOGRAM (TEE);  Surgeon: Rudine Cos, MD;  Location: [facility];  Service: Open Heart Surgery;  Laterality: N/A;   TONSILLECTOMY     TONSILLECTOMY     VIDEO BRONCHOSCOPY Bilateral 07/26/2015   Procedure: VIDEO BRONCHOSCOPY WITH FLUORO;  Surgeon: Diamond Formica, MD;  Location: [facility];  Service: Cardiopulmonary;  Laterality: Bilateral;   VIDEO BRONCHOSCOPY WITH ENDOBRONCHIAL NAVIGATION N/A 08/23/2015   Procedure: VIDEO BRONCHOSCOPY WITH ENDOBRONCHIAL NAVIGATION;  Surgeon: Norita Beauvais, MD;  Location: [facility];  Service: Thoracic;  Laterality: N/A;   VIDEO BRONCHOSCOPY WITH ENDOBRONCHIAL ULTRASOUND N/A 08/23/2015   Procedure: VIDEO BRONCHOSCOPY WITH  ENDOBRONCHIAL ULTRASOUND;  Surgeon: Norita Beauvais, MD;  Location: [facility];  Service: Thoracic;  Laterality: N/A;    Social History   Tobacco Use   Smoking status: Former    Current packs/day: 0.00    Average packs/day: 1 pack/day for 57.0 years (57.0 ttl pk-yrs)    Types: Cigarettes, Cigars    Start date: 08/07/1958    Quit date: 08/08/2015    Years since quitting: 8.6   Smokeless tobacco: Former    Types: Chew    Quit date: 11/04/1958   Tobacco comments:    Will smoke cigar every once in awhile.  Vaping daily started a couple weeks ago.  04/18/22 hfb    Patient states he is still vaping. AB, CMA 09-10-2023  Vaping Use   Vaping status: Some Days  Substance Use Topics   Alcohol use: Not Currently    Alcohol/week: 0.0 standard drinks of alcohol   Drug use: No    Family History  Problem Relation Age of Onset   Leukemia Father    Emphysema Father    Learning disabilities Son    Atrial fibrillation Son    Leukemia Other    Stroke Other     Allergies  Allergen Reactions   Iodine Swelling    Neck, gland swelling   Iohexol  Swelling    Neck, gland swelling   Metformin  And Related Diarrhea   Metformin  Nausea Only    Medication list has been reviewed and updated.  Current Outpatient Medications on File Prior to Visit  Medication Sig Dispense Refill   acetaminophen  (TYLENOL ) 500 MG tablet Take 1-2 tablets (500-1,000 mg total) by mouth every 6 (six) hours as needed. 30 tablet 0   amiodarone  (PACERONE ) 200 MG tablet Take 1 tablet (200 mg total) by mouth daily. 90 tablet 3   benzonatate  (TESSALON ) 100 MG capsule Take 1 capsule (100 mg total) by mouth 3 (three) times daily as needed. 30 capsule 0   Budeson-Glycopyrrol-Formoterol  (BREZTRI  AEROSPHERE) 160-9-4.8 MCG/ACT AERO Inhale 2 puffs into the lungs 2 (two) times daily.     diltiazem  (CARDIZEM  CD) 120 MG 24 hr capsule Take 1 capsule (120 mg total) by mouth daily. 90 capsule 1   furosemide  (LASIX ) 40 MG tablet Take 1 tablet (40  mg total) by mouth daily. 90 tablet 0   glipiZIDE  (GLUCOTROL  XL) 2.5 MG 24 hr tablet Take 1 tablet (2.5 mg total) by mouth daily with breakfast. 90 tablet 0   levalbuterol  (XOPENEX ) 0.63 MG/3ML nebulizer solution Take 3 mLs (0.63  mg total) by nebulization every 4 (four) hours as needed for wheezing or shortness of breath. 75 mL 12   LORazepam  (ATIVAN ) 0.5 MG tablet Take 1 tablet (0.5 mg total) by mouth every 8 (eight) hours as needed for severe anxiety/shortness of breath not relieved by roxanol 6 tablet 0   omeprazole  (PRILOSEC) 40 MG capsule Take 1 capsule (40 mg total) by mouth daily. 90 capsule 1   ondansetron  (ZOFRAN ) 4 MG tablet Take 1 tablet (4 mg total) by mouth every 8 (eight) hours as needed for nausea or vomiting. 30 tablet 1   potassium chloride  (KLOR-CON ) 10 MEQ tablet Take 1 tablet (10 mEq total) by mouth daily. 90 tablet 0   prochlorperazine  (COMPAZINE ) 10 MG tablet Take 10 mg by mouth every 6 (six) hours as needed.     rivaroxaban  (XARELTO ) 20 MG TABS tablet Take 1 tablet (20 mg total) by mouth every evening with supper. 90 tablet 1   senna (SENNA LAXATIVE) 8.6 MG tablet Take 1-4 tablets (8.6-34.4 mg total) by mouth 2 (two) times daily as needed for constipation 100 tablet 0   spironolactone  (ALDACTONE ) 50 MG tablet Take 1 tablet (50 mg total) by mouth daily. 90 tablet 0   tamsulosin  (FLOMAX ) 0.4 MG CAPS capsule Take 1 capsule (0.4 mg total) by mouth at bedtime. 90 capsule 1   No current facility-administered medications on file prior to visit.    Review of Systems:  As per HPI- otherwise negative.   Physical Examination: Vitals:   03/15/24 1326  BP: 132/74  Pulse: 74  Resp: 18  Temp: 97.6 F (36.4 C)  SpO2: 96%   Vitals:   03/15/24 1326  Weight: 180 lb 3.2 oz (81.7 kg)  Height: 5\' 7"  (1.702 m)   Body mass index is 28.22 kg/m. Ideal Body Weight: Weight in (lb) to have BMI = 25: 159.3  GEN: no acute distress.  Mildly overweight, looks well HEENT: Atraumatic,  Normocephalic.  Ears and Nose: No external deformity. CV: RRR, No M/G/R. No JVD. No thrill. No extra heart sounds. PULM: Mild wheezes are heard bilaterally no retractions. No resp. distress. No accessory muscle use. ABD: S, NT, ND, +BS. No rebound. No HSM. EXTR: No c/c/e PSYCH: Normally interactive. Conversant.  Impacted cerumen is present in both ear canals.  Verbal consent obtained, used warm water  and peroxide to irrigate ears.  We were able to successfully clear the left ear.  Removed most cerumen from the right ear  Rapid COVID, rapid strep is negative Assessment and Plan: COPD exacerbation (HCC) - Plan: doxycycline  (VIBRAMYCIN ) 100 MG capsule, predniSONE  (DELTASONE ) 20 MG tablet  Sore throat - Plan: POC COVID-19 BinaxNow, POCT rapid strep A  Bilateral impacted cerumen  Patient seen today with cerumen impaction in both ear canals and likely COPD exacerbation We will treat him with doxycycline  and prednisone  I asked him to please let me know if he is not feeling better in the next few days and he will do so  Signed Gates Kasal, MD

## 2024-03-15 NOTE — Telephone Encounter (Signed)
 Patient notified refill was denied

## 2024-03-15 NOTE — Telephone Encounter (Signed)
 Palliative care was prescribing ativan .  I received refill request.  I would not recommend that he take this with the lyrica  and the tramadol .  I declined the refill.

## 2024-03-15 NOTE — Telephone Encounter (Signed)
 Chief Complaint: Sore throat/Strep throat Symptoms: see notes Frequency: since Friday Pertinent Negatives: Patient denies rash Disposition: [] ED /[] Urgent Care (no appt availability in office) / [x] Appointment(In office/virtual)/ []  [facility]/ [] Home Care/ [] Refused Recommended Disposition /[] [facility]/ []  Follow-up with PCP Additional Notes: Patient called in stating he is having a severe sore throat, with difficulty swallowing due to the pain. Patient states his son has been sick for two weeks with sore throat and fever. Patient unable to determine if there is pus on tonsils. Patient thinks he may be running a fever due to sweating during the night. Patient also has mild cough. Patient appt made for earliest availability due to mentioning mild shortness of breath and swallowing difficulties.    Copied from CRM [id number]. Topic: Clinical - Red Word Triage >> Mar 15, 2024 10:03 AM Keitha Pata L wrote: [facility] that prompted transfer to Nurse Triage: STREPT THROAT, SOAR, FEVER, CAN'T SWALLOW  AND LOSING VOICE Reason for Disposition  SEVERE (e.g., excruciating) throat pain  Answer Assessment - Initial Assessment Questions 1. ONSET: "When did the throat start hurting?" (Hours or days ago)      Friday 2. SEVERITY: "How bad is the sore throat?" (Scale 1-10; mild, moderate or severe)   - MILD (1-3):  Doesn't interfere with eating or normal activities.   - MODERATE (4-7): Interferes with eating some solids and normal activities.   - SEVERE (8-10):  Excruciating pain, interferes with most normal activities.   - SEVERE WITH DYSPHAGIA (10): Can't swallow liquids, drooling.     Difficulty swallowing 3. STREP EXPOSURE: "Has there been any exposure to strep within the past week?" If Yes, ask: "What type of contact occurred?"      Son was sick last 2 weeks but unsure of strep 4.  VIRAL SYMPTOMS: "Are there any symptoms of a cold, such as a runny nose, cough, hoarse voice or red  eyes?"      Hoarse voice, cough 5. FEVER: "Do you have a fever?" If Yes, ask: "What is your temperature, how was it measured, and when did it start?"     Unsure but was sweating all night 6. PUS ON THE TONSILS: "Is there pus on the tonsils in the back of your throat?"     Uncertain 7. OTHER SYMPTOMS: "Do you have any other symptoms?" (e.g., difficulty breathing, headache, rash)     Throat irritation, hurts to swallow, mild shortness of breath  Protocols used: Sore Throat-A-AH

## 2024-03-15 NOTE — Telephone Encounter (Signed)
 Appt with Dr. Geralyn Knee today.

## 2024-03-16 LAB — POCT RAPID STREP A (OFFICE): Rapid Strep A Screen: NEGATIVE

## 2024-03-16 LAB — POC COVID19 BINAXNOW: SARS Coronavirus 2 Ag: NEGATIVE

## 2024-03-18 ENCOUNTER — Ambulatory Visit: Admitting: Podiatry

## 2024-03-18 ENCOUNTER — Other Ambulatory Visit (HOSPITAL_BASED_OUTPATIENT_CLINIC_OR_DEPARTMENT_OTHER): Payer: Self-pay

## 2024-03-22 DIAGNOSIS — R220 Localized swelling, mass and lump, head: Secondary | ICD-10-CM | POA: Diagnosis not present

## 2024-03-22 DIAGNOSIS — K5730 Diverticulosis of large intestine without perforation or abscess without bleeding: Secondary | ICD-10-CM | POA: Diagnosis not present

## 2024-03-22 DIAGNOSIS — J90 Pleural effusion, not elsewhere classified: Secondary | ICD-10-CM | POA: Diagnosis not present

## 2024-03-22 DIAGNOSIS — D320 Benign neoplasm of cerebral meninges: Secondary | ICD-10-CM | POA: Diagnosis not present

## 2024-03-22 DIAGNOSIS — G936 Cerebral edema: Secondary | ICD-10-CM | POA: Diagnosis not present

## 2024-03-22 DIAGNOSIS — R079 Chest pain, unspecified: Secondary | ICD-10-CM | POA: Diagnosis not present

## 2024-03-22 DIAGNOSIS — N200 Calculus of kidney: Secondary | ICD-10-CM | POA: Diagnosis not present

## 2024-03-22 DIAGNOSIS — G9389 Other specified disorders of brain: Secondary | ICD-10-CM | POA: Diagnosis not present

## 2024-03-22 DIAGNOSIS — I6782 Cerebral ischemia: Secondary | ICD-10-CM | POA: Diagnosis not present

## 2024-03-22 DIAGNOSIS — R109 Unspecified abdominal pain: Secondary | ICD-10-CM | POA: Diagnosis not present

## 2024-03-22 DIAGNOSIS — R1013 Epigastric pain: Secondary | ICD-10-CM | POA: Diagnosis not present

## 2024-03-22 DIAGNOSIS — R531 Weakness: Secondary | ICD-10-CM | POA: Diagnosis not present

## 2024-03-22 DIAGNOSIS — R918 Other nonspecific abnormal finding of lung field: Secondary | ICD-10-CM | POA: Diagnosis not present

## 2024-03-22 DIAGNOSIS — I6523 Occlusion and stenosis of bilateral carotid arteries: Secondary | ICD-10-CM | POA: Diagnosis not present

## 2024-03-22 DIAGNOSIS — J439 Emphysema, unspecified: Secondary | ICD-10-CM | POA: Diagnosis not present

## 2024-03-22 DIAGNOSIS — R0602 Shortness of breath: Secondary | ICD-10-CM | POA: Diagnosis not present

## 2024-03-22 DIAGNOSIS — R059 Cough, unspecified: Secondary | ICD-10-CM | POA: Diagnosis not present

## 2024-03-22 DIAGNOSIS — R479 Unspecified speech disturbances: Secondary | ICD-10-CM | POA: Diagnosis not present

## 2024-03-22 DIAGNOSIS — I7121 Aneurysm of the ascending aorta, without rupture: Secondary | ICD-10-CM | POA: Diagnosis not present

## 2024-03-23 DIAGNOSIS — I7121 Aneurysm of the ascending aorta, without rupture: Secondary | ICD-10-CM | POA: Diagnosis not present

## 2024-03-23 DIAGNOSIS — I6523 Occlusion and stenosis of bilateral carotid arteries: Secondary | ICD-10-CM | POA: Diagnosis not present

## 2024-03-23 DIAGNOSIS — N200 Calculus of kidney: Secondary | ICD-10-CM | POA: Diagnosis not present

## 2024-03-23 DIAGNOSIS — G9389 Other specified disorders of brain: Secondary | ICD-10-CM | POA: Diagnosis not present

## 2024-03-23 DIAGNOSIS — I2109 ST elevation (STEMI) myocardial infarction involving other coronary artery of anterior wall: Secondary | ICD-10-CM | POA: Diagnosis not present

## 2024-03-23 DIAGNOSIS — K5730 Diverticulosis of large intestine without perforation or abscess without bleeding: Secondary | ICD-10-CM | POA: Diagnosis not present

## 2024-03-23 DIAGNOSIS — D320 Benign neoplasm of cerebral meninges: Secondary | ICD-10-CM | POA: Diagnosis not present

## 2024-03-23 DIAGNOSIS — G936 Cerebral edema: Secondary | ICD-10-CM | POA: Diagnosis not present

## 2024-03-23 DIAGNOSIS — I6782 Cerebral ischemia: Secondary | ICD-10-CM | POA: Diagnosis not present

## 2024-03-23 DIAGNOSIS — R220 Localized swelling, mass and lump, head: Secondary | ICD-10-CM | POA: Diagnosis not present

## 2024-03-23 DIAGNOSIS — I491 Atrial premature depolarization: Secondary | ICD-10-CM | POA: Diagnosis not present

## 2024-03-23 DIAGNOSIS — J439 Emphysema, unspecified: Secondary | ICD-10-CM | POA: Diagnosis not present

## 2024-03-25 ENCOUNTER — Ambulatory Visit: Admitting: Internal Medicine

## 2024-03-25 ENCOUNTER — Ambulatory Visit: Payer: Medicare Other | Admitting: Internal Medicine

## 2024-03-26 ENCOUNTER — Other Ambulatory Visit (HOSPITAL_BASED_OUTPATIENT_CLINIC_OR_DEPARTMENT_OTHER): Payer: Self-pay

## 2024-03-26 ENCOUNTER — Other Ambulatory Visit: Payer: Self-pay | Admitting: Family Medicine

## 2024-03-26 ENCOUNTER — Other Ambulatory Visit: Payer: Self-pay

## 2024-03-26 ENCOUNTER — Other Ambulatory Visit (HOSPITAL_COMMUNITY): Payer: Self-pay

## 2024-03-26 ENCOUNTER — Telehealth: Payer: Self-pay | Admitting: Family

## 2024-03-26 ENCOUNTER — Other Ambulatory Visit: Payer: Self-pay | Admitting: Family

## 2024-03-26 DIAGNOSIS — G8929 Other chronic pain: Secondary | ICD-10-CM

## 2024-03-26 DIAGNOSIS — J441 Chronic obstructive pulmonary disease with (acute) exacerbation: Secondary | ICD-10-CM

## 2024-03-26 MED ORDER — TRAMADOL HCL 50 MG PO TABS
50.0000 mg | ORAL_TABLET | Freq: Two times a day (BID) | ORAL | 0 refills | Status: DC | PRN
Start: 2024-03-26 — End: 2024-04-14
  Filled 2024-03-26 (×2): qty 60, 30d supply, fill #0

## 2024-03-26 NOTE — Telephone Encounter (Signed)
 Patient notified of rx for tramadol , he reports he is breathing better

## 2024-03-26 NOTE — Telephone Encounter (Signed)
 Tramadol  refill, no longer on  med list. Please advise.

## 2024-03-26 NOTE — Telephone Encounter (Signed)
 Please advise pt that I sent rx for tramadol  refill (per pharmacy technician's request) to his pharmacy. I see he is also requesting a refill for prednisone . He saw Dr. Geralyn Knee for COPD exacerbation on 5/12. She gave him prednisone . We typically don't refill this medication. If his breathing has not improved, he should schedule a follow up appointment for re-evaluation please.

## 2024-03-27 ENCOUNTER — Other Ambulatory Visit (HOSPITAL_COMMUNITY): Payer: Self-pay

## 2024-03-30 ENCOUNTER — Other Ambulatory Visit (HOSPITAL_BASED_OUTPATIENT_CLINIC_OR_DEPARTMENT_OTHER): Payer: Self-pay

## 2024-03-31 ENCOUNTER — Other Ambulatory Visit: Payer: Self-pay | Admitting: Physician Assistant

## 2024-03-31 DIAGNOSIS — Z7901 Long term (current) use of anticoagulants: Secondary | ICD-10-CM

## 2024-03-31 DIAGNOSIS — E785 Hyperlipidemia, unspecified: Secondary | ICD-10-CM

## 2024-03-31 DIAGNOSIS — Z01810 Encounter for preprocedural cardiovascular examination: Secondary | ICD-10-CM

## 2024-03-31 DIAGNOSIS — I4819 Other persistent atrial fibrillation: Secondary | ICD-10-CM

## 2024-03-31 DIAGNOSIS — Z951 Presence of aortocoronary bypass graft: Secondary | ICD-10-CM

## 2024-03-31 DIAGNOSIS — I5032 Chronic diastolic (congestive) heart failure: Secondary | ICD-10-CM

## 2024-03-31 DIAGNOSIS — I10 Essential (primary) hypertension: Secondary | ICD-10-CM

## 2024-04-01 ENCOUNTER — Telehealth (HOSPITAL_COMMUNITY): Payer: Self-pay | Admitting: *Deleted

## 2024-04-01 NOTE — Telephone Encounter (Signed)
 Pt given instructions for MPI study.

## 2024-04-02 ENCOUNTER — Telehealth: Payer: Self-pay | Admitting: Primary Care

## 2024-04-02 NOTE — Telephone Encounter (Signed)
 Cmn received from [facility] for nebulizer.

## 2024-04-06 NOTE — Telephone Encounter (Signed)
 CMN faxed successfully and signed.

## 2024-04-08 ENCOUNTER — Encounter (HOSPITAL_COMMUNITY): Payer: Self-pay | Admitting: *Deleted

## 2024-04-08 ENCOUNTER — Ambulatory Visit (HOSPITAL_COMMUNITY)
Admission: RE | Admit: 2024-04-08 | Discharge: 2024-04-08 | Disposition: A | Discharge status: Home / Self Care | Source: Ambulatory Visit | Attending: Internal Medicine | Admitting: Internal Medicine

## 2024-04-08 ENCOUNTER — Ambulatory Visit: Admitting: Podiatry

## 2024-04-08 ENCOUNTER — Encounter: Payer: Self-pay | Admitting: Podiatry

## 2024-04-08 DIAGNOSIS — E1142 Type 2 diabetes mellitus with diabetic polyneuropathy: Secondary | ICD-10-CM

## 2024-04-08 DIAGNOSIS — I4819 Other persistent atrial fibrillation: Secondary | ICD-10-CM | POA: Insufficient documentation

## 2024-04-08 DIAGNOSIS — E785 Hyperlipidemia, unspecified: Secondary | ICD-10-CM

## 2024-04-08 DIAGNOSIS — M79675 Pain in left toe(s): Secondary | ICD-10-CM

## 2024-04-08 DIAGNOSIS — I5032 Chronic diastolic (congestive) heart failure: Secondary | ICD-10-CM

## 2024-04-08 DIAGNOSIS — Z7901 Long term (current) use of anticoagulants: Secondary | ICD-10-CM

## 2024-04-08 DIAGNOSIS — Z951 Presence of aortocoronary bypass graft: Secondary | ICD-10-CM

## 2024-04-08 DIAGNOSIS — I10 Essential (primary) hypertension: Secondary | ICD-10-CM | POA: Insufficient documentation

## 2024-04-08 DIAGNOSIS — L608 Other nail disorders: Secondary | ICD-10-CM

## 2024-04-08 DIAGNOSIS — Z01810 Encounter for preprocedural cardiovascular examination: Secondary | ICD-10-CM

## 2024-04-08 DIAGNOSIS — B351 Tinea unguium: Secondary | ICD-10-CM

## 2024-04-08 DIAGNOSIS — M79674 Pain in right toe(s): Secondary | ICD-10-CM | POA: Diagnosis not present

## 2024-04-08 LAB — ECHOCARDIOGRAM COMPLETE
Area-P 1/2: 3.91 cm2
Height: 67 in
S' Lateral: 3.1 cm
Weight: 2880 [oz_av]

## 2024-04-08 MED ORDER — PERFLUTREN LIPID MICROSPHERE
1.0000 mL | INTRAVENOUS | Status: AC | PRN
  Administered 2024-04-08: 1 mL via INTRAVENOUS

## 2024-04-08 NOTE — Progress Notes (Signed)
 This patient returns to my office for at risk foot care.  This patient requires this care by a professional since this patient will be at risk due to having diabetic peripheral neuropathy and coagulation defect.  This patient is unable to cut nails himself since the patient cannot reach his nails.These nails are painful walking and wearing shoes.  Patient has not been seen in over 9 months.This patient presents for at risk foot care today.  General Appearance  Alert, conversant and in no acute stress.  Vascular  Dorsalis pedis and posterior tibial  pulses are palpable  bilaterally.  Capillary return is within normal limits  bilaterally. Temperature is within normal limits  bilaterally.  Neurologic  Senn-Weinstein monofilament wire test within normal limits  bilaterally. Muscle power within normal limits bilaterally.  Nails Thick disfigured discolored nails with subungual debris  from hallux to fifth toes bilaterally. No evidence of bacterial infection or drainage bilaterally. Pincer nails.  Orthopedic  No limitations of motion  feet .  No crepitus or effusions noted.  No bony pathology or digital deformities noted.  Skin  normotropic skin with no porokeratosis noted bilaterally.  No signs of infections or ulcers noted.     Onychomycosis  Pain in right toes  Pain in left toes  Consent was obtained for treatment procedures.   Mechanical debridement of nails 1-5  bilaterally performed with a nail nipper.  Filed with dremel without incident.    Return office visit                     Told patient to return for periodic foot care and evaluation due to potential at risk complications.   Ruffin Cotton DPM

## 2024-04-09 ENCOUNTER — Other Ambulatory Visit: Payer: Self-pay | Admitting: Physician Assistant

## 2024-04-09 ENCOUNTER — Ambulatory Visit: Payer: Self-pay | Admitting: Physician Assistant

## 2024-04-09 DIAGNOSIS — I5032 Chronic diastolic (congestive) heart failure: Secondary | ICD-10-CM

## 2024-04-09 DIAGNOSIS — I10 Essential (primary) hypertension: Secondary | ICD-10-CM

## 2024-04-09 DIAGNOSIS — Z7901 Long term (current) use of anticoagulants: Secondary | ICD-10-CM

## 2024-04-09 DIAGNOSIS — I4819 Other persistent atrial fibrillation: Secondary | ICD-10-CM

## 2024-04-09 DIAGNOSIS — Z951 Presence of aortocoronary bypass graft: Secondary | ICD-10-CM

## 2024-04-09 DIAGNOSIS — Z01810 Encounter for preprocedural cardiovascular examination: Secondary | ICD-10-CM

## 2024-04-09 DIAGNOSIS — E785 Hyperlipidemia, unspecified: Secondary | ICD-10-CM

## 2024-04-14 ENCOUNTER — Other Ambulatory Visit: Payer: Self-pay

## 2024-04-14 ENCOUNTER — Other Ambulatory Visit: Payer: Self-pay | Admitting: Family

## 2024-04-14 ENCOUNTER — Other Ambulatory Visit (HOSPITAL_BASED_OUTPATIENT_CLINIC_OR_DEPARTMENT_OTHER): Payer: Self-pay

## 2024-04-14 DIAGNOSIS — G8929 Other chronic pain: Secondary | ICD-10-CM

## 2024-04-14 DIAGNOSIS — E119 Type 2 diabetes mellitus without complications: Secondary | ICD-10-CM

## 2024-04-14 MED ORDER — LEVALBUTEROL HCL 0.63 MG/3ML IN NEBU
0.6300 mg | INHALATION_SOLUTION | RESPIRATORY_TRACT | 12 refills | Status: AC | PRN
  Filled 2024-04-14 – 2024-07-30 (×2): qty 75, 5d supply, fill #0

## 2024-04-14 MED ORDER — BREZTRI AEROSPHERE 160-9-4.8 MCG/ACT IN AERO: 2.0000 | INHALATION_SPRAY | Freq: Two times a day (BID) | RESPIRATORY_TRACT | Status: AC

## 2024-04-14 MED ORDER — ONDANSETRON HCL 4 MG PO TABS
4.0000 mg | ORAL_TABLET | Freq: Three times a day (TID) | ORAL | 1 refills | Status: DC | PRN
  Filled 2024-04-14: qty 30, 10d supply, fill #0
  Filled 2024-05-14: qty 30, 10d supply, fill #1

## 2024-04-14 MED ORDER — TRAMADOL HCL 50 MG PO TABS: 50.0000 mg | ORAL_TABLET | Freq: Two times a day (BID) | ORAL | 0 refills | Status: DC | PRN

## 2024-04-14 MED ORDER — TRAMADOL HCL 50 MG PO TABS
50.0000 mg | ORAL_TABLET | Freq: Two times a day (BID) | ORAL | 0 refills | Status: DC | PRN
  Filled 2024-04-14: qty 60, 30d supply, fill #0

## 2024-04-14 MED ORDER — GLIPIZIDE ER 2.5 MG PO TB24
2.5000 mg | ORAL_TABLET | Freq: Every day | ORAL | 0 refills | Status: DC
  Filled 2024-04-14: qty 90, 90d supply, fill #0

## 2024-04-14 NOTE — Telephone Encounter (Signed)
 Copied from CRM [id number]. Topic: Clinical - Prescription Issue >> Apr 14, 2024 11:03 AM Melissa C wrote: Reason for CRM: patient needs prescriptions refilled but he cannot remember the names of the prescriptions. He knows what they do to help him, but he just doesn't know the names. Please advise with patient regarding this request.

## 2024-04-14 NOTE — Telephone Encounter (Signed)
 Requesting: tramadol  50mg  and lorazepam  0.6mg   Contract:02/27/24 UDS: 06/23/23 Last Visit: 02/06/24 Next Visit: 04/30/24 Last Refill: see med list   Please Advise

## 2024-04-14 NOTE — Progress Notes (Unsigned)
 Subjective:     Patient ID: Robert Bates, male    DOB: 13-Nov-1940, 83 y.o.   MRN: [id number]  No chief complaint on file.   HPI  Discussed the use of AI scribe software for clinical note transcription with the patient, who gave verbal consent to proceed.  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   COPD (chronic obstructive pulmonary disease) (HCC)    Coronary artery disease    COVID-19 virus infection 04/23/2021   Diabetes type 2, controlled (HCC) 07/31/2017   Diverticulitis 07/29/2023   Dyspnea    Dysrhythmia    a fib   GERD (gastroesophageal reflux disease)    Hematuria    refuses work up or referral - understands risks of morbidity / mortality - 11/2008, 12/2008   Heme positive stool    History of hiatal hernia    History of kidney stones    Hyperlipemia    Meningioma (HCC) 10/25/2013   Follows with Dr. Audie Bleacher.    Peripheral vascular disease (HCC)    Abdominal Aortic Aneursym   Pneumonia    as a child   Radiation 09/18/15-10/25/15   left lower lobe 70.2 Gy   Seizures (HCC) 02/18/2020   Tobacco abuse     Past Surgical History:  Procedure Laterality Date   CHOLECYSTECTOMY N/A 07/23/2017   Procedure: LAPAROSCOPIC CHOLECYSTECTOMY;  Surgeon: Derral Flick, MD;  Location: [facility];  Service: General;  Laterality: N/A;   CLIPPING OF ATRIAL APPENDAGE Left 05/08/2021   Procedure: CLIPPING OF ATRIAL APPENDAGE  USING 45 ATRICLIP;  Surgeon: Rudine Cos, MD;  Location: [facility];  Service: Open Heart Surgery;  Laterality: Left;   COLONOSCOPY     CORONARY ARTERY BYPASS GRAFT N/A 05/08/2021   Procedure: CORONARY ARTERY BYPASS GRAFTING (CABG)X 3 USING LEFT INTERNAL MAMMARY ARTERY AND RIGHT GREATER SAPEHNOUS VEIN;  Surgeon: Rudine Cos, MD;  Location: [facility];  Service: Open Heart Surgery;  Laterality: N/A;   ENDOVEIN HARVEST OF GREATER SAPHENOUS VEIN Right 05/08/2021   Procedure: ENDOVEIN HARVEST OF GREATER SAPHENOUS VEIN;  Surgeon: Rudine Cos, MD;  Location: [facility];  Service: Open Heart Surgery;  Laterality: Right;   EYE SURGERY Bilateral    Cataracts removed w/ lens implant   HERNIA REPAIR     Left 36 years ago . Right inguinal hernia repair 10-01-17 Dr. Dorrie Gaudier   INGUINAL HERNIA REPAIR Right 10/01/2017   Procedure: RIGHT INGUINAL HERNIA REPAIR WITH MESH;  Surgeon: Kinsinger, Alphonso Aschoff, MD;  Location: [facility];  Service: General;  Laterality: Right;  TAP BLOCK   INSERTION OF MESH Right 10/01/2017   Procedure: INSERTION OF MESH;  Surgeon: Dorrie Gaudier Alphonso Aschoff, MD;  Location: [facility];  Service: General;  Laterality: Right;   IR THORACENTESIS ASP PLEURAL SPACE W/IMG GUIDE  05/18/2021   IR THORACENTESIS ASP PLEURAL SPACE W/IMG GUIDE  06/07/2021   LEFT HEART CATH AND CORONARY ANGIOGRAPHY N/A 04/20/2021   Procedure: LEFT HEART CATH AND CORONARY ANGIOGRAPHY;  Surgeon: Wenona Hamilton, MD;  Location: [facility];  Service: Cardiovascular;  Laterality: N/A;   TEE WITHOUT CARDIOVERSION N/A 05/08/2021   Procedure: TRANSESOPHAGEAL ECHOCARDIOGRAM (TEE);  Surgeon: Rudine Cos, MD;  [facility];  Service: Open Heart Surgery;  Laterality: N/A;   TONSILLECTOMY     TONSILLECTOMY     VIDEO BRONCHOSCOPY Bilateral 07/26/2015   Procedure: VIDEO BRONCHOSCOPY WITH FLUORO;  Surgeon: Diamond Formica, MD;  Location: [facility];  Service: Cardiopulmonary;  Laterality: Bilateral;   VIDEO BRONCHOSCOPY WITH  ENDOBRONCHIAL NAVIGATION N/A 08/23/2015   Procedure: VIDEO BRONCHOSCOPY WITH ENDOBRONCHIAL NAVIGATION;  Surgeon: Norita Beauvais, MD;  Location: [facility];  Service: Thoracic;  Laterality: N/A;   VIDEO BRONCHOSCOPY WITH ENDOBRONCHIAL ULTRASOUND N/A 08/23/2015   Procedure: VIDEO BRONCHOSCOPY WITH ENDOBRONCHIAL ULTRASOUND;  Surgeon: Norita Beauvais, MD;  Location: [facility];  Service: Thoracic;  Laterality: N/A;    Family History  Problem Relation Age of Onset   Leukemia Father    Emphysema Father    Learning disabilities Son    Atrial fibrillation Son    Leukemia Other    Stroke Other     Social History   Socioeconomic History   Marital status: Widowed    Spouse name: Not on file   Number of children: 2   Years of education: Not on file   Highest education level: Not on file  Occupational History   Occupation: Retired    Associate Professor: [employer]    Comment: truck Air traffic controller: [employer]  Tobacco Use   Smoking status: Former    Current packs/day: 0.00    Average packs/day: 1 pack/day for 57.0 years (57.0 ttl pk-yrs)    Types: Cigarettes, Cigars    Start date: 08/07/1958    Quit date: 08/08/2015    Years since quitting: 8.6   Smokeless tobacco: Former    Types: Chew    Quit date: 11/04/1958   Tobacco comments:    Will smoke cigar every once in awhile.  Vaping daily started a couple weeks ago.  04/18/22 hfb    Patient states he is still vaping. AB, CMA 09-10-2023  Vaping Use   Vaping status: Some Days  Substance and Sexual Activity   Alcohol use: Not Currently    Alcohol/week: 0.0 standard drinks of alcohol   Drug use: No   Sexual activity: Not Currently  Other Topics Concern   Not on file  Social History Narrative   Not on file   Social Drivers of Health   Financial Resource Strain: Low Risk  (05/22/2023)   Overall Financial Resource Strain (CARDIA)    Difficulty of Paying Living Expenses: Not very hard  Food Insecurity: No Food Insecurity (08/14/2023)   Hunger  Vital Sign    Worried About Running Out of Food in the Last Year: Never true    Ran Out of Food in the Last Year: Never true  Transportation Needs: No Transportation Needs (08/14/2023)   PRAPARE - Administrator, Civil Service (Medical): No    Lack of Transportation (Non-Medical): No  Physical Activity: Insufficiently Active (08/05/2021)   Exercise Vital Sign    Days of Exercise per Week: 2 days    Minutes of Exercise per Session: 40 min  Stress: Stress Concern Present (05/22/2023)   [employer] of Occupational Health - Occupational Stress Questionnaire  Feeling of Stress : Rather much  Social Connections: Moderately Isolated (05/13/2022)   Social Connection and Isolation Panel [NHANES]    Frequency of Communication with Friends and Family: Twice a week    Frequency of Social Gatherings with Friends and Family: Once a week    Attends Religious Services: Never    Database administrator or Organizations: No    Attends Banker Meetings: Never    Marital Status: Married  Catering manager Violence: Not At Risk (08/04/2023)   Humiliation, Afraid, Rape, and Kick questionnaire    Fear of Current or Ex-Partner: No    Emotionally Abused: No    Physically Abused: No    Sexually Abused: No    Outpatient Medications Prior to Visit  Medication Sig Dispense Refill   acetaminophen  (TYLENOL ) 500 MG tablet Take 1-2 tablets (500-1,000 mg total) by mouth every 6 (six) hours as needed. 30 tablet 0   amiodarone  (PACERONE ) 200 MG tablet Take 1 tablet (200 mg total) by mouth daily. 90 tablet 3   benzonatate  (TESSALON ) 100 MG capsule Take 1 capsule (100 mg total) by mouth 3 (three) times daily as needed. 30 capsule 0   budesonide -glycopyrrolate -formoterol  (BREZTRI  AEROSPHERE) 160-9-4.8 MCG/ACT AERO inhaler Inhale 2 puffs into the lungs 2 (two) times daily.     diltiazem  (CARDIZEM  CD) 120 MG 24 hr capsule Take 1 capsule (120 mg total) by mouth daily. 90 capsule 1   doxycycline   (VIBRAMYCIN ) 100 MG capsule Take 1 capsule (100 mg total) by mouth 2 (two) times daily. 20 capsule 0   furosemide  (LASIX ) 40 MG tablet Take 1 tablet (40 mg total) by mouth daily. 90 tablet 0   glipiZIDE  (GLUCOTROL  XL) 2.5 MG 24 hr tablet Take 1 tablet (2.5 mg total) by mouth daily with breakfast. 90 tablet 0   levalbuterol  (XOPENEX ) 0.63 MG/3ML nebulizer solution Take 3 mLs (0.63 mg total) by nebulization every 4 (four) hours as needed for wheezing or shortness of breath. 75 mL 12   LORazepam  (ATIVAN ) 0.5 MG tablet Take 1 tablet (0.5 mg total) by mouth every 8 (eight) hours as needed for severe anxiety/shortness of breath not relieved by roxanol 6 tablet 0   omeprazole  (PRILOSEC) 40 MG capsule Take 1 capsule (40 mg total) by mouth daily. 90 capsule 1   ondansetron  (ZOFRAN ) 4 MG tablet Take 1 tablet (4 mg total) by mouth every 8 (eight) hours as needed for nausea or vomiting. 30 tablet 1   potassium chloride  (KLOR-CON ) 10 MEQ tablet Take 1 tablet (10 mEq total) by mouth daily. 90 tablet 0   predniSONE  (DELTASONE ) 20 MG tablet Take 2 tablets (40 mg total) by mouth daily for 3-5 days for wheezing. 10 tablet 0   prochlorperazine  (COMPAZINE ) 10 MG tablet Take 10 mg by mouth every 6 (six) hours as needed.     rivaroxaban  (XARELTO ) 20 MG TABS tablet Take 1 tablet (20 mg total) by mouth every evening with supper. 90 tablet 1   senna (SENNA LAXATIVE) 8.6 MG tablet Take 1-4 tablets (8.6-34.4 mg total) by mouth 2 (two) times daily as needed for constipation 100 tablet 0   spironolactone  (ALDACTONE ) 50 MG tablet Take 1 tablet (50 mg total) by mouth daily. 90 tablet 0   tamsulosin  (FLOMAX ) 0.4 MG CAPS capsule Take 1 capsule (0.4 mg total) by mouth at bedtime. 90 capsule 1   traMADol  (ULTRAM ) 50 MG tablet Take 1 tablet (50 mg total) by mouth every 12 (twelve) hours as needed. 60 tablet 0  No facility-administered medications prior to visit.    Allergies  Allergen Reactions   Iodine Swelling    Neck, gland  swelling   Iohexol  Swelling    Neck, gland swelling   Metformin  And Related Diarrhea   Metformin  Nausea Only    ROS     Objective:     Physical Exam   There were no vitals taken for this visit. Wt Readings from Last 3 Encounters:  04/08/24 180 lb (81.6 kg)  03/15/24 180 lb 3.2 oz (81.7 kg)  03/05/24 177 lb 3.2 oz (80.4 kg)       Assessment & Plan:   Problem List Items Addressed This Visit   None Visit Diagnoses       Chronic back pain, unspecified back location, unspecified back pain laterality       Relevant Medications   traMADol  (ULTRAM ) 50 MG tablet       I am having Daleen Dubs maintain his acetaminophen , rivaroxaban , LORazepam , senna, amiodarone , tamsulosin , prochlorperazine , diltiazem , omeprazole , benzonatate , furosemide , potassium chloride , spironolactone , doxycycline , predniSONE , glipiZIDE , ondansetron , levalbuterol , Breztri  Aerosphere, and traMADol .  Meds ordered this encounter  Medications   traMADol  (ULTRAM ) 50 MG tablet    Sig: Take 1 tablet (50 mg total) by mouth every 12 (twelve) hours as needed.    Dispense:  60 tablet    Refill:  0    Supervising Provider:   Randie Bustle A [[id number]]

## 2024-04-16 ENCOUNTER — Other Ambulatory Visit (HOSPITAL_BASED_OUTPATIENT_CLINIC_OR_DEPARTMENT_OTHER): Payer: Self-pay

## 2024-04-16 ENCOUNTER — Other Ambulatory Visit: Payer: Self-pay | Admitting: Family Medicine

## 2024-04-16 ENCOUNTER — Ambulatory Visit (HOSPITAL_COMMUNITY)
Admission: RE | Admit: 2024-04-16 | Discharge: 2024-04-16 | Disposition: A | Discharge status: Home / Self Care | Source: Ambulatory Visit | Attending: Cardiology | Admitting: Cardiology

## 2024-04-16 DIAGNOSIS — I10 Essential (primary) hypertension: Secondary | ICD-10-CM | POA: Insufficient documentation

## 2024-04-16 DIAGNOSIS — Z01810 Encounter for preprocedural cardiovascular examination: Secondary | ICD-10-CM | POA: Insufficient documentation

## 2024-04-16 DIAGNOSIS — E785 Hyperlipidemia, unspecified: Secondary | ICD-10-CM | POA: Insufficient documentation

## 2024-04-16 DIAGNOSIS — Z951 Presence of aortocoronary bypass graft: Secondary | ICD-10-CM | POA: Diagnosis not present

## 2024-04-16 DIAGNOSIS — J441 Chronic obstructive pulmonary disease with (acute) exacerbation: Secondary | ICD-10-CM

## 2024-04-16 DIAGNOSIS — Z7901 Long term (current) use of anticoagulants: Secondary | ICD-10-CM | POA: Diagnosis not present

## 2024-04-16 DIAGNOSIS — I4819 Other persistent atrial fibrillation: Secondary | ICD-10-CM | POA: Diagnosis not present

## 2024-04-16 DIAGNOSIS — I5032 Chronic diastolic (congestive) heart failure: Secondary | ICD-10-CM | POA: Insufficient documentation

## 2024-04-16 MED ORDER — REGADENOSON 0.4 MG/5ML IV SOLN
0.4000 mg | Freq: Once | INTRAVENOUS | Status: AC
  Administered 2024-04-16: 0.4 mg via INTRAVENOUS

## 2024-04-16 MED ORDER — TECHNETIUM TC 99M TETROFOSMIN IV KIT: 32.7000 | PACK | Freq: Once | INTRAVENOUS | Status: AC | PRN

## 2024-04-16 MED ORDER — TECHNETIUM TC 99M TETROFOSMIN IV KIT: 10.4000 | PACK | Freq: Once | INTRAVENOUS | Status: AC | PRN

## 2024-04-16 MED ORDER — REGADENOSON 0.4 MG/5ML IV SOLN
INTRAVENOUS | Status: AC
  Filled 2024-04-16: qty 5

## 2024-04-19 ENCOUNTER — Telehealth: Payer: Self-pay

## 2024-04-19 ENCOUNTER — Other Ambulatory Visit (HOSPITAL_BASED_OUTPATIENT_CLINIC_OR_DEPARTMENT_OTHER): Payer: Self-pay

## 2024-04-19 NOTE — Telephone Encounter (Signed)
 Called patient but no answer, left voice mail for patient to call back.

## 2024-04-19 NOTE — Telephone Encounter (Unsigned)
 Copied from CRM [id number]. Topic: General - Other >> Apr 19, 2024 12:35 PM Adrionna Y wrote: Reason for CRM: Patient is calling because he received a call from kaylyn and was calling her back

## 2024-04-19 NOTE — Telephone Encounter (Signed)
 Called patient again to see if he needs anything from us . No answer. E2C2 please transfer call to office. Henry Loge CMA

## 2024-04-19 NOTE — Telephone Encounter (Signed)
 Initial Comment Caller states he was at the heart dr last week and yesterday and pt was supposed to get a procedure done to his back and pt states he was supposed to call the office but doesn't know why. Pt states he has blockers in his heart. Additional Comment Office hours provided. Translation No Disp. Time [facility] Time) Disposition Final User 04/17/2024 8:26:22 AM General Information Provided Yes Derick Fleeting Final Disposition 04/17/2024 8:26:22 AM General Information Provided Yes Gibson, Jade

## 2024-04-19 NOTE — Telephone Encounter (Signed)
 I did not call him, looks like Henry Loge did

## 2024-04-22 ENCOUNTER — Other Ambulatory Visit (HOSPITAL_BASED_OUTPATIENT_CLINIC_OR_DEPARTMENT_OTHER): Payer: Self-pay

## 2024-04-25 ENCOUNTER — Encounter: Payer: Self-pay | Admitting: Cardiovascular Disease

## 2024-04-25 ENCOUNTER — Other Ambulatory Visit: Payer: Self-pay | Admitting: Family

## 2024-04-25 DIAGNOSIS — I480 Paroxysmal atrial fibrillation: Secondary | ICD-10-CM

## 2024-04-25 NOTE — Progress Notes (Unsigned)
 Cardiology Office Note:    Date:  04/26/2024   ID:  Robert Bates, DOB 13-Jun-1941, MRN [id number]  PCP:  Daryl Setter, NP  Cardiologist:  Aleene Passe, MD    Referring MD: Daryl Setter, NP   Chief Complaint  Patient presents with   Hypertension   Coronary Artery Disease   aortic aneurism    Previous notes:     Robert Bates is a 83 y.o. male with a hx of  PAF. 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 14, 2022: Robert Bates is seen today for worsening shortness of breath  Hx of cigarette smoking, lung CA, COPD, AAA , HTN  He has had more dyspnea recently  Also some atypical CP and DOE   Has PAF - is on Xarelto   Has lots of DOE and wheezing .  Still smoking  Uses inhalers   Had CABG July 5, with L atrial clipping   Had post op Atrial fib   Dec. 4, 2023 Robert Bates is seen for follow up of his shortness of breath, AAA,COPD,HTN  Still driving turck Quick smoking ,  vapes occasion  No cp, Not able to exercise .   Will check lipids , ALT , BMP today    Sept. 30, 2024 Robert Bates is seen for recurrence of his atrial fib He is more short of breath today   EKG shows atrial flutter with 2-1 AV block with a heart rate of 172.  Is very fatigued. Short of breath,  No CP  Has a headache Has been taking all of her medications  Has been taking additional lasix  but it does not seem to be working   Has had more wheezing than usual  O2 sats are 95%   April 25, 2024  Robert Bates is seen for follow up of his  CAD , CABG, PAF, COPD, tobacco use, PVD, AAA ,  HTN  He needs to have back surgery   Echo 04/08/24 : normal LV systolic function with EF 60-65%, grade II DD Normal RV size and function Mild TR  Lexiscan  myoview  shows inferolateral infarction that is partially reversible and a medium sized defect in the anterior wall that shows a reversible defect suggestive of anterior ischemia .  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   COPD (chronic obstructive pulmonary disease) (HCC)    Coronary artery disease    COVID-19 virus infection 04/23/2021   Diabetes type 2, controlled (HCC) 07/31/2017   Diverticulitis 07/29/2023  Dyspnea    Dysrhythmia    a fib   GERD (gastroesophageal reflux disease)    Hematuria    refuses work up or referral - understands risks of morbidity / mortality - 11/2008, 12/2008   Heme positive stool    History of hiatal hernia    History of kidney stones    Hyperlipemia    Meningioma (HCC) 10/25/2013   Follows with Dr. Rockey [place].    Peripheral vascular disease (HCC)    Abdominal Aortic Aneursym   Pneumonia    as a child   Radiation 09/18/15-10/25/15   left lower lobe 70.2 Gy   Seizures (HCC) 02/18/2020   Tobacco abuse     Past Surgical History:  Procedure Laterality Date   CHOLECYSTECTOMY N/A 07/23/2017   Procedure: LAPAROSCOPIC CHOLECYSTECTOMY;  Surgeon: Stevie Herlene Righter, MD;  Location: [facility];  Service: General;  Laterality: N/A;   CLIPPING OF ATRIAL APPENDAGE Left 05/08/2021   Procedure: CLIPPING OF ATRIAL APPENDAGE USING 45  ATRICLIP;  Surgeon: German Bartlett PEDLAR, MD;  Location: [facility];  Service: Open Heart Surgery;  Laterality: Left;   COLONOSCOPY     CORONARY ARTERY BYPASS GRAFT N/A 05/08/2021   Procedure: CORONARY ARTERY BYPASS GRAFTING (CABG)X 3 USING LEFT INTERNAL MAMMARY ARTERY AND RIGHT GREATER SAPEHNOUS VEIN;  Surgeon: German Bartlett PEDLAR, MD;  Location: [facility];  Service: Open Heart Surgery;  Laterality: N/A;   ENDOVEIN HARVEST OF GREATER SAPHENOUS VEIN Right 05/08/2021   Procedure: ENDOVEIN HARVEST OF GREATER SAPHENOUS VEIN;  Surgeon: German Bartlett PEDLAR, MD;  Location: [facility];  Service: Open Heart Surgery;  Laterality: Right;   EYE SURGERY Bilateral    Cataracts removed w/ lens implant   HERNIA REPAIR     Left 36 years ago . Right inguinal hernia repair 10-01-17 Dr. Stevie   INGUINAL HERNIA REPAIR Right 10/01/2017   Procedure: RIGHT INGUINAL HERNIA REPAIR WITH MESH;  Surgeon: Kinsinger, Herlene Righter, MD;  Location: [facility];  Service: General;  Laterality: Right;  TAP BLOCK   INSERTION OF MESH Right 10/01/2017   Procedure: INSERTION OF MESH;  Surgeon: Stevie Herlene Righter, MD;  Location: [facility];  Service: General;  Laterality: Right;   IR THORACENTESIS ASP PLEURAL SPACE W/IMG GUIDE  05/18/2021   IR THORACENTESIS ASP PLEURAL SPACE W/IMG GUIDE  06/07/2021   LEFT HEART CATH AND CORONARY ANGIOGRAPHY N/A 04/20/2021   Procedure: LEFT HEART CATH AND CORONARY ANGIOGRAPHY;  Surgeon: Darron Deatrice LABOR, MD;  [facility]: [facility];  Service: Cardiovascular;  Laterality: N/A;   TEE WITHOUT CARDIOVERSION N/A 05/08/2021   Procedure: TRANSESOPHAGEAL ECHOCARDIOGRAM (TEE);  Surgeon: German Bartlett PEDLAR, MD;  Location: [facility];  Service: Open Heart Surgery;  Laterality: N/A;   TONSILLECTOMY     TONSILLECTOMY     VIDEO BRONCHOSCOPY Bilateral 07/26/2015   Procedure: VIDEO BRONCHOSCOPY WITH FLUORO;  Surgeon: Ozell KATHEE America, MD;  Location: [facility];  Service: Cardiopulmonary;  Laterality: Bilateral;   VIDEO BRONCHOSCOPY WITH  ENDOBRONCHIAL NAVIGATION N/A 08/23/2015   Procedure: VIDEO BRONCHOSCOPY WITH ENDOBRONCHIAL NAVIGATION;  Surgeon: Dallas KATHEE Jude, MD;  Location: [facility];  Service: Thoracic;  Laterality: N/A;   VIDEO BRONCHOSCOPY WITH ENDOBRONCHIAL ULTRASOUND N/A 08/23/2015   Procedure: VIDEO BRONCHOSCOPY WITH ENDOBRONCHIAL ULTRASOUND;  Surgeon: Dallas KATHEE Jude, MD;  Location: [facility];  Service: Thoracic;  Laterality: N/A;    Current Medications: Current Meds  Medication Sig   acetaminophen  (TYLENOL ) 500 MG tablet Take 1-2 tablets (500-1,000 mg total) by mouth every 6 (six) hours as  needed.   amiodarone  (PACERONE ) 200 MG tablet Take 1 tablet (200 mg total) by mouth daily.   benzonatate  (TESSALON ) 100 MG capsule Take 1 capsule (100 mg total) by mouth 3 (three) times daily as needed.   budesonide -glycopyrrolate -formoterol  (BREZTRI  AEROSPHERE) 160-9-4.8 MCG/ACT AERO inhaler Inhale 2 puffs into the lungs 2 (two) times daily.   diltiazem  (CARDIZEM  CD) 120 MG 24 hr capsule Take 1 capsule (120 mg total) by mouth daily.   doxycycline  (VIBRAMYCIN ) 100 MG capsule Take 1 capsule (100 mg total) by mouth 2 (two) times daily.   furosemide  (LASIX ) 40 MG tablet Take 1 tablet (40 mg total) by mouth daily.   glipiZIDE  (GLUCOTROL  XL) 2.5 MG 24 hr tablet Take 1 tablet (2.5 mg total) by mouth daily with breakfast.   levalbuterol  (XOPENEX ) 0.63 MG/3ML nebulizer solution Take 3 mLs (0.63 mg total) by nebulization every 4 (four) hours as needed for wheezing or shortness of breath.   LORazepam  (ATIVAN ) 0.5 MG tablet Take 1 tablet (0.5 mg total) by mouth every 8 (eight) hours as needed for severe anxiety/shortness of breath not relieved by roxanol   omeprazole  (PRILOSEC) 40 MG capsule Take 1 capsule (40 mg total) by mouth daily.   ondansetron  (ZOFRAN ) 4 MG tablet Take 1 tablet (4 mg total) by mouth every 8 (eight) hours as needed for nausea or vomiting.   potassium chloride  (KLOR-CON ) 10 MEQ tablet Take 1 tablet (10 mEq total) by mouth  daily.   predniSONE  (DELTASONE ) 20 MG tablet Take 2 tablets (40 mg total) by mouth daily for 3-5 days for wheezing.   prochlorperazine  (COMPAZINE ) 10 MG tablet Take 10 mg by mouth every 6 (six) hours as needed.   rivaroxaban  (XARELTO ) 20 MG TABS tablet TAKE 1 TABLET BY MOUTH EVERY DAY   senna (SENNA LAXATIVE) 8.6 MG tablet Take 1-4 tablets (8.6-34.4 mg total) by mouth 2 (two) times daily as needed for constipation   spironolactone  (ALDACTONE ) 50 MG tablet Take 1 tablet (50 mg total) by mouth daily.   tamsulosin  (FLOMAX ) 0.4 MG CAPS capsule Take 1 capsule (0.4 mg total) by mouth at bedtime.   traMADol  (ULTRAM ) 50 MG tablet Take 1 tablet (50 mg total) by mouth every 12 (twelve) hours as needed.     Allergies:   Iodine, Iohexol , Metformin  and related, and Metformin    Social History   Socioeconomic History   Marital status: Widowed    Spouse name: Not on file   Number of children: 2   Years of education: Not on file   Highest education level: Not on file  Occupational History   Occupation: Retired    Associate Professor: [employer]    Comment: truck Air traffic controller: [employer]  Tobacco Use   Smoking status: Former    Current packs/day: 0.00    Average packs/day: 1 pack/day for 57.0 years (57.0 ttl pk-yrs)    Types: Cigarettes, Cigars    Start date: 08/07/1958    Quit date: 08/08/2015    Years since quitting: 8.7   Smokeless tobacco: Former    Types: Chew    Quit date: 11/04/1958   Tobacco comments:    Will smoke cigar every once in awhile.  Vaping daily started a couple weeks ago.  04/18/22 hfb    Patient states he is still vaping. AB, CMA 09-10-2023  Vaping Use   Vaping status: Some Days  Substance and Sexual Activity   Alcohol use: Not Currently    Alcohol/week: 0.0 standard drinks of alcohol   Drug  use: No   Sexual activity: Not Currently  Other Topics Concern   Not on file  Social History Narrative   Not on file   Social Drivers of Health   Financial Resource Strain: Low  Risk  (05/22/2023)   Overall Financial Resource Strain (CARDIA)    Difficulty of Paying Living Expenses: Not very hard  Food Insecurity: No Food Insecurity (08/14/2023)   Hunger Vital Sign    Worried About Running Out of Food in the Last Year: Never true    Ran Out of Food in the Last Year: Never true  Transportation Needs: No Transportation Needs (08/14/2023)   PRAPARE - Administrator, Civil Service (Medical): No    Lack of Transportation (Non-Medical): No  Physical Activity: Insufficiently Active (08/05/2021)   Exercise Vital Sign    Days of Exercise per Week: 2 days    Minutes of Exercise per Session: 40 min  Stress: Stress Concern Present (05/22/2023)   [employer] of Occupational Health - Occupational Stress Questionnaire    Feeling of Stress : Rather much  Social Connections: Moderately Isolated (05/13/2022)   Social Connection and Isolation Panel    Frequency of Communication with Friends and Family: Twice a week    Frequency of Social Gatherings with Friends and Family: Once a week    Attends Religious Services: Never    Database administrator or Organizations: No    Attends Engineer, structural: Never    Marital Status: Married     Family History: The patient's family history includes Atrial fibrillation in his son; Emphysema in his father; Learning disabilities in his son; Leukemia in his father and another family member; Stroke in an other family member. ROS:   Please see the history of present illness.     All other systems reviewed and are negative.  EKGs/Labs/Other Studies Reviewed:    The following studies were reviewed today:   EKG:        Recent Labs: 08/04/2023: B Natriuretic Peptide 134.1 08/08/2023: Magnesium  2.2; TSH 0.211 08/14/2023: ALT 27; BUN 29; Creatinine, Ser 1.05; Hemoglobin 13.7; Platelets 230.0; Potassium 4.6; Pro B Natriuretic peptide (BNP) 83.0; Sodium 136  Recent Lipid Panel    Component Value Date/Time   CHOL 142  08/07/2023 0404   CHOL 127 10/07/2022 1128   TRIG 81 08/07/2023 0404   HDL 79 08/07/2023 0404   HDL 64 10/07/2022 1128   CHOLHDL 1.8 08/07/2023 0404   VLDL 16 08/07/2023 0404   LDLCALC 47 08/07/2023 0404   LDLCALC 46 10/07/2022 1128   LDLCALC 62 04/11/2021 1421   LDLDIRECT 142.2 12/26/2008 1032    Physical Exam:     Physical Exam: Blood pressure (!) 102/58, pulse 71, height 5' 7 (1.702 m), weight 174 lb 9.6 oz (79.2 kg), SpO2 94%.       GEN:  Well nourished, well developed in no acute distress HEENT: Normal NECK: No JVD; No carotid bruits LYMPHATICS: No lymphadenopathy CARDIAC: RRR , no murmurs, rubs, gallops RESPIRATORY:  Clear to auscultation without rales, wheezing or rhonchi  ABDOMEN: Soft, non-tender, non-distended MUSCULOSKELETAL:  No edema; No deformity  SKIN: Warm and dry NEUROLOGIC:  Alert and oriented x 3    ASSESSMENT:    1. Coronary artery disease involving native coronary artery of native heart without angina pectoris        PLAN:      1.     Pre op evaluation prior to back surgery   He informed me  that he will not be having general anesthesia. Will be getting MAC   He is severely limited and would like to have this surgery soon.    Given his hx of CAD and CABG and  abnormalities on his myoview  study , he is at moderate - high risk for his procedure but he appears to be stable and optimized for his surgery .  He may hold his Xarelto  for 3 days prior to procedure .  2.  Paroxysmal atrial fibrillation:     stable    3. Lung cancer:    4.  CAD :  s/p CABG ;   no angina.  Long time smoker.  He has significant COPD.  5.  Wheezing :  / pseudowheezing .     6.        Medication Adjustments/Labs and Tests Ordered: Current medicines are reviewed at length with the patient today.  Concerns regarding medicines are outlined above.  No orders of the defined types were placed in this encounter.   No orders of the defined types were placed  in this encounter.      Signed, Aleene Passe, MD  04/26/2024 11:59 AM    [facility]

## 2024-04-26 ENCOUNTER — Encounter: Payer: Self-pay | Admitting: Cardiovascular Disease

## 2024-04-26 ENCOUNTER — Ambulatory Visit: Payer: Medicare Other | Attending: Cardiovascular Disease | Admitting: Cardiovascular Disease

## 2024-04-26 VITALS — BP 102/58 | HR 71 | Ht 67.0 in | Wt 174.6 lb

## 2024-04-26 DIAGNOSIS — I2510 Atherosclerotic heart disease of native coronary artery without angina pectoris: Secondary | ICD-10-CM | POA: Diagnosis not present

## 2024-04-26 NOTE — Patient Instructions (Signed)
 Follow-Up: At [facility], you and your health needs are our priority.  As part of our continuing mission to provide you with exceptional heart care, our providers are all part of one team.  This team includes your primary Cardiologist (physician) and Advanced Practice Providers or APPs (Physician Assistants and Nurse Practitioners) who all work together to provide you with the care you need, when you need it.  Your next appointment:   6 month(s)  Provider:   Glendia Ferrier

## 2024-04-30 ENCOUNTER — Ambulatory Visit (INDEPENDENT_AMBULATORY_CARE_PROVIDER_SITE_OTHER): Admitting: Family

## 2024-04-30 ENCOUNTER — Other Ambulatory Visit (HOSPITAL_BASED_OUTPATIENT_CLINIC_OR_DEPARTMENT_OTHER): Payer: Self-pay

## 2024-04-30 VITALS — BP 105/55 | HR 62 | Temp 97.7°F | Resp 16 | Ht 67.0 in | Wt 174.0 lb

## 2024-04-30 DIAGNOSIS — Z7984 Long term (current) use of oral hypoglycemic drugs: Secondary | ICD-10-CM | POA: Diagnosis not present

## 2024-04-30 DIAGNOSIS — C3490 Malignant neoplasm of unspecified part of unspecified bronchus or lung: Secondary | ICD-10-CM | POA: Diagnosis not present

## 2024-04-30 DIAGNOSIS — E119 Type 2 diabetes mellitus without complications: Secondary | ICD-10-CM

## 2024-04-30 DIAGNOSIS — I714 Abdominal aortic aneurysm, without rupture, unspecified: Secondary | ICD-10-CM

## 2024-04-30 DIAGNOSIS — I4819 Other persistent atrial fibrillation: Secondary | ICD-10-CM | POA: Diagnosis not present

## 2024-04-30 DIAGNOSIS — I10 Essential (primary) hypertension: Secondary | ICD-10-CM

## 2024-04-30 DIAGNOSIS — K219 Gastro-esophageal reflux disease without esophagitis: Secondary | ICD-10-CM

## 2024-04-30 DIAGNOSIS — E785 Hyperlipidemia, unspecified: Secondary | ICD-10-CM

## 2024-04-30 DIAGNOSIS — J449 Chronic obstructive pulmonary disease, unspecified: Secondary | ICD-10-CM | POA: Diagnosis not present

## 2024-04-30 DIAGNOSIS — F419 Anxiety disorder, unspecified: Secondary | ICD-10-CM | POA: Insufficient documentation

## 2024-04-30 DIAGNOSIS — I5033 Acute on chronic diastolic (congestive) heart failure: Secondary | ICD-10-CM | POA: Diagnosis not present

## 2024-04-30 DIAGNOSIS — I482 Chronic atrial fibrillation, unspecified: Secondary | ICD-10-CM

## 2024-04-30 MED ORDER — LORAZEPAM 0.5 MG PO TABS
0.5000 mg | ORAL_TABLET | Freq: Every evening | ORAL | 0 refills | Status: DC | PRN
  Filled 2024-04-30: qty 30, 30d supply, fill #0

## 2024-04-30 MED ORDER — ATORVASTATIN CALCIUM 80 MG PO TABS
80.0000 mg | ORAL_TABLET | Freq: Every day | ORAL | 1 refills | Status: AC
  Filled 2024-04-30: qty 90, 90d supply, fill #0
  Filled 2024-09-27: qty 90, 90d supply, fill #1

## 2024-04-30 NOTE — Assessment & Plan Note (Signed)
 Advised pt to take his diuretics daily.

## 2024-04-30 NOTE — Assessment & Plan Note (Signed)
 Rate stable on diltiazem , xarelto .

## 2024-04-30 NOTE — Assessment & Plan Note (Signed)
 Lab Results  Component Value Date   HGBA1C 6.2 01/28/2024   HGBA1C 7.2 (H) 09/16/2023   HGBA1C 6.7 (H) 04/03/2023   Lab Results  Component Value Date   MICROALBUR 1.8 09/16/2023   LDLCALC 47 08/07/2023   CREATININE 1.05 08/14/2023   Stable on glipizide . Denies any symptomatic hypoglycemia.

## 2024-04-30 NOTE — Assessment & Plan Note (Signed)
 Feels that he needs lorazepam  more that tramadol . D/C tramadol , restart prn lorazepam .

## 2024-04-30 NOTE — Assessment & Plan Note (Signed)
 BP stable on diltiazem  and aldactone .

## 2024-04-30 NOTE — Assessment & Plan Note (Signed)
 Stable on breztri  and prn xarelto .

## 2024-04-30 NOTE — Assessment & Plan Note (Signed)
Stable on omeprazole. Continue same.  ?

## 2024-04-30 NOTE — Assessment & Plan Note (Signed)
 He had curative radiotherapy and continues to follow with Dr. Gwenyth Bouillon for surveillance imaging.

## 2024-04-30 NOTE — Assessment & Plan Note (Signed)
 Maintained on xarelto , rate controlled on diltiazem .

## 2024-04-30 NOTE — Patient Instructions (Signed)
 VISIT SUMMARY:  You visited us  today for medication management and follow-up for your hypertension, coronary artery disease, and other health concerns. We discussed your current medications and made some adjustments to better manage your symptoms.  YOUR PLAN:  HEART FAILURE: You have swelling in your feet likely due to your blood pressure medication, but it is necessary for your heart condition. -Continue taking diltiazem  and Aldactone . -Continue diuretic therapy.  CHRONIC OBSTRUCTIVE PULMONARY DISEASE (COPD): You experience occasional dizziness and shortness of breath. -Continue using the Breztri  inhaler, two puffs twice daily.  INSOMNIA: You have difficulty sleeping and prefer using lorazepam  for sleep. -Prescribe lorazepam  for bedtime use. -Discontinue pain medication if you use lorazepam .  TYPE 2 DIABETES MELLITUS: Your diabetes is well-controlled with glipizide  and you have not had any low blood sugar episodes. -Recheck your A1c. -Continue taking glipizide .  HYPERLIPIDEMIA: Your cholesterol medication was restarted due to your improved condition. -Restart atorvastatin .  GASTROESOPHAGEAL REFLUX DISEASE (GERD): You have no current heartburn issues. -Continue taking omeprazole  as needed.  GENERAL HEALTH MAINTENANCE: You need to schedule an eye exam. -Schedule an eye exam.  FOLLOW-UP: You have plans to potentially relocate and upcoming cardiac and spinal evaluations. -Follow up with your cardiologist in December. -Await a call from the spine specialist for further evaluation.

## 2024-04-30 NOTE — Assessment & Plan Note (Signed)
 Lab Results  Component Value Date   CHOL 142 08/07/2023   HDL 79 08/07/2023   LDLCALC 47 08/07/2023   LDLDIRECT 142.2 12/26/2008   TRIG 81 08/07/2023   CHOLHDL 1.8 08/07/2023   Restart atorvastatin . This was stopped by palliative care team.

## 2024-04-30 NOTE — Assessment & Plan Note (Signed)
 3.9  this past spring. Continue annual surveillance.

## 2024-04-30 NOTE — Progress Notes (Signed)
 Subjective:     Patient ID: Robert Bates, male    DOB: 06/06/1941, 83 y.o.   MRN: [id number]  Chief Complaint  Patient presents with   Hypertension    Here for follow up    HPI  Discussed the use of AI scribe software for clinical note transcription with the patient, who gave verbal consent to proceed.  History of Present Illness  Robert Bates is an 83 year old male with hypertension and coronary artery disease who presents for medication management and follow-up. He takes diltiazem  and Aldactone  for blood pressure management. His feet swell when taking diuretics, but the swelling decreases when he skips them. He is on Xarelto  and previously took Lipitor , which was discontinued during hospice care. His last aorta ultrasound in March showed a measurement of 3.9 cm. He experiences difficulty sleeping, often lying awake until daylight. He finds nerve pills helpful for sleep, as he experiences hot feet and restless legs at night. He uses an inhaler and a breathing tablet for dizziness and shortness of breath. He dislikes using a nebulizer, which he keeps in storage. He takes glipizide  for diabetes management and has no episodes of hypoglycemia. He experiences hot and cold sensations, which he attributes to his blood thinner medication. He has no current heartburn issues and takes omeprazole  as needed.      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   COPD (chronic  obstructive pulmonary disease) (HCC)    Coronary artery disease    COVID-19 virus infection 04/23/2021   Diabetes type 2, controlled (HCC) 07/31/2017   Diverticulitis 07/29/2023   Dyspnea    Dysrhythmia    a fib   GERD (gastroesophageal reflux disease)    Hematuria    refuses work up or referral - understands risks of morbidity / mortality - 11/2008, 12/2008   Heme positive stool    History of hiatal hernia    History of kidney stones    Hyperlipemia    Meningioma (HCC) 10/25/2013   Follows with Dr. Rockey [place].    Peripheral vascular disease (HCC)    Abdominal Aortic Aneursym   Pneumonia    as a child   Radiation 09/18/15-10/25/15   left lower lobe 70.2 Gy   Seizures (HCC) 02/18/2020   Tobacco abuse     Past Surgical History:  Procedure Laterality Date   CHOLECYSTECTOMY N/A 07/23/2017   Procedure: LAPAROSCOPIC CHOLECYSTECTOMY;  Surgeon: Stevie Herlene Righter, MD;  Location: [facility];  Service: General;  Laterality: N/A;   CLIPPING OF ATRIAL APPENDAGE Left 05/08/2021   Procedure: CLIPPING OF ATRIAL APPENDAGE USING 45 ATRICLIP;  Surgeon: German Bartlett PEDLAR, MD;  Location: [facility];  Service: Open Heart Surgery;  Laterality: Left;   COLONOSCOPY     CORONARY ARTERY BYPASS GRAFT N/A 05/08/2021   Procedure: CORONARY ARTERY BYPASS GRAFTING (CABG)X 3 USING LEFT INTERNAL MAMMARY ARTERY AND RIGHT GREATER SAPEHNOUS VEIN;  Surgeon: German Bartlett PEDLAR, MD;  Location: [facility];  Service: Open Heart Surgery;  Laterality: N/A;   ENDOVEIN HARVEST OF GREATER SAPHENOUS VEIN Right 05/08/2021   Procedure: ENDOVEIN HARVEST OF GREATER SAPHENOUS VEIN;  Surgeon: German Bartlett PEDLAR, MD;  Location: [facility];  Service: Open Heart Surgery;  Laterality: Right;   EYE SURGERY Bilateral    Cataracts removed w/ lens implant   HERNIA REPAIR     Left 36 years ago . Right inguinal hernia repair 10-01-17 Dr. Stevie   INGUINAL HERNIA REPAIR Right 10/01/2017   Procedure: RIGHT INGUINAL HERNIA REPAIR WITH MESH;  Surgeon: Kinsinger,  Herlene Righter, MD;  Location: [facility];  Service: General;  Laterality: Right;  TAP BLOCK   INSERTION OF MESH Right 10/01/2017   Procedure: INSERTION OF MESH;  Surgeon: Stevie Herlene Righter, MD;  Location: [facility];  Service: General;  Laterality: Right;   IR THORACENTESIS ASP PLEURAL SPACE W/IMG GUIDE  05/18/2021   IR THORACENTESIS ASP PLEURAL SPACE W/IMG GUIDE  06/07/2021   LEFT HEART CATH AND CORONARY ANGIOGRAPHY N/A 04/20/2021   Procedure: LEFT HEART CATH AND CORONARY ANGIOGRAPHY;  Surgeon: Darron Deatrice LABOR, MD;  Location: [facility];  Service: Cardiovascular;  Laterality: N/A;   TEE WITHOUT CARDIOVERSION N/A 05/08/2021   Procedure: TRANSESOPHAGEAL ECHOCARDIOGRAM (TEE);  Surgeon: German Bartlett PEDLAR, MD;  Location: [facility] OR;  Service: Open Heart Surgery;  Laterality: N/A;   TONSILLECTOMY     TONSILLECTOMY     VIDEO BRONCHOSCOPY Bilateral 07/26/2015   Procedure: VIDEO BRONCHOSCOPY WITH FLUORO;  Surgeon: Ozell KATHEE America, MD;  Location: [facility];  Service: Cardiopulmonary;  Laterality: Bilateral;   VIDEO BRONCHOSCOPY WITH ENDOBRONCHIAL NAVIGATION N/A 08/23/2015   Procedure: VIDEO BRONCHOSCOPY WITH ENDOBRONCHIAL NAVIGATION;  Surgeon: Dallas KATHEE Jude, MD;  Location: [facility];  Service: Thoracic;  Laterality: N/A;   VIDEO BRONCHOSCOPY WITH ENDOBRONCHIAL ULTRASOUND N/A 08/23/2015   Procedure: VIDEO BRONCHOSCOPY WITH ENDOBRONCHIAL ULTRASOUND;  Surgeon: Dallas KATHEE Jude, MD;  Location: [facility];  Service: Thoracic;  Laterality: N/A;    Family History  Problem Relation Age of Onset   Leukemia Father    Emphysema Father    Learning disabilities Son    Atrial fibrillation Son    Leukemia Other    Stroke Other     Social History   Socioeconomic History   Marital status: Widowed    Spouse name: Not on file   Number of children: 2   Years of education: Not on file   Highest education level: Not on file  Occupational History   Occupation: Retired    Associate Professor: [employer]    Comment: truck  Air traffic controller: [employer]  Tobacco Use   Smoking status: Former    Current packs/day: 0.00    Average packs/day: 1 pack/day for 57.0 years (57.0 ttl pk-yrs)    Types: Cigarettes, Cigars    Start date: 08/07/1958    Quit date: 08/08/2015    Years since quitting: 8.7   Smokeless tobacco: Former    Types: Chew    Quit date: 11/04/1958   Tobacco comments:    Will smoke cigar every once in awhile.  Vaping daily started a couple weeks ago.  04/18/22 hfb    Patient states he is still vaping. AB, CMA 09-10-2023  Vaping Use   Vaping status: Some Days  Substance and Sexual Activity   Alcohol use: Not Currently    Alcohol/week: 0.0 standard drinks of alcohol   Drug use: No   Sexual activity: Not Currently  Other  Topics Concern   Not on file  Social History Narrative   Not on file   Social Drivers of Health   Financial Resource Strain: Low Risk  (05/22/2023)   Overall Financial Resource Strain (CARDIA)    Difficulty of Paying Living Expenses: Not very hard  Food Insecurity: No Food Insecurity (08/14/2023)   Hunger Vital Sign    Worried About Running Out of Food in the Last Year: Never true    Ran Out of Food in the Last Year: Never true  Transportation Needs: No Transportation Needs (08/14/2023)   PRAPARE - Administrator, Civil Service (Medical): No    Lack of Transportation (Non-Medical): No  Physical Activity: Insufficiently Active (08/05/2021)   Exercise Vital Sign    Days of Exercise per Week: 2 days    Minutes of Exercise per Session: 40 min  Stress: Stress Concern Present (05/22/2023)   [employer] of Occupational Health - Occupational Stress Questionnaire    Feeling of Stress : Rather much  Social Connections: Moderately Isolated (05/13/2022)   Social Connection and Isolation Panel    Frequency of Communication with Friends and Family: Twice a week    Frequency of Social Gatherings with Friends and Family: Once a week    Attends Religious Services: Never     Database administrator or Organizations: No    Attends Banker Meetings: Never    Marital Status: Married  Catering manager Violence: Not At Risk (08/04/2023)   Humiliation, Afraid, Rape, and Kick questionnaire    Fear of Current or Ex-Partner: No    Emotionally Abused: No    Physically Abused: No    Sexually Abused: No    Outpatient Medications Prior to Visit  Medication Sig Dispense Refill   acetaminophen  (TYLENOL ) 500 MG tablet Take 1-2 tablets (500-1,000 mg total) by mouth every 6 (six) hours as needed. 30 tablet 0   amiodarone  (PACERONE ) 200 MG tablet Take 1 tablet (200 mg total) by mouth daily. 90 tablet 3   budesonide -glycopyrrolate -formoterol  (BREZTRI  AEROSPHERE) 160-9-4.8 MCG/ACT AERO inhaler Inhale 2 puffs into the lungs 2 (two) times daily.     diltiazem  (CARDIZEM  CD) 120 MG 24 hr capsule Take 1 capsule (120 mg total) by mouth daily. 90 capsule 1   doxycycline  (VIBRAMYCIN ) 100 MG capsule Take 1 capsule (100 mg total) by mouth 2 (two) times daily. 20 capsule 0   furosemide  (LASIX ) 40 MG tablet Take 1 tablet (40 mg total) by mouth daily. 90 tablet 0   glipiZIDE  (GLUCOTROL  XL) 2.5 MG 24 hr tablet Take 1 tablet (2.5 mg total) by mouth daily with breakfast. 90 tablet 0   levalbuterol  (XOPENEX ) 0.63 MG/3ML nebulizer solution Take 3 mLs (0.63 mg total) by nebulization every 4 (four) hours as needed for wheezing or shortness of breath. 75 mL 12   omeprazole  (PRILOSEC) 40 MG capsule Take 1 capsule (40 mg total) by mouth daily. 90 capsule 1   ondansetron  (ZOFRAN ) 4 MG tablet Take 1 tablet (4 mg total) by mouth every 8 (eight) hours as needed for nausea or vomiting. 30 tablet 1   potassium chloride  (KLOR-CON ) 10 MEQ tablet Take 1 tablet (10 mEq total) by mouth daily. 90 tablet 0   prochlorperazine  (COMPAZINE ) 10 MG tablet Take 10 mg by mouth every 6 (six) hours as needed.     rivaroxaban  (XARELTO ) 20 MG TABS tablet TAKE 1 TABLET BY MOUTH EVERY DAY 90 tablet 1   senna (SENNA  LAXATIVE) 8.6 MG tablet Take  1-4 tablets (8.6-34.4 mg total) by mouth 2 (two) times daily as needed for constipation 100 tablet 0   spironolactone  (ALDACTONE ) 50 MG tablet Take 1 tablet (50 mg total) by mouth daily. 90 tablet 0   tamsulosin  (FLOMAX ) 0.4 MG CAPS capsule Take 1 capsule (0.4 mg total) by mouth at bedtime. 90 capsule 1   LORazepam  (ATIVAN ) 0.5 MG tablet Take 1 tablet (0.5 mg total) by mouth every 8 (eight) hours as needed for severe anxiety/shortness of breath not relieved by roxanol 6 tablet 0   traMADol  (ULTRAM ) 50 MG tablet Take 1 tablet (50 mg total) by mouth every 12 (twelve) hours as needed. 60 tablet 0   benzonatate  (TESSALON ) 100 MG capsule Take 1 capsule (100 mg total) by mouth 3 (three) times daily as needed. 30 capsule 0   predniSONE  (DELTASONE ) 20 MG tablet Take 2 tablets (40 mg total) by mouth daily for 3-5 days for wheezing. 10 tablet 0   No facility-administered medications prior to visit.    Allergies  Allergen Reactions   Iodine Swelling    Neck, gland swelling   Iohexol  Swelling    Neck, gland swelling   Metformin  And Related Diarrhea   Metformin  Nausea Only    ROS See HPI    Objective:    Physical Exam Constitutional:      General: He is not in acute distress.    Appearance: He is well-developed.  HENT:     Head: Normocephalic and atraumatic.   Cardiovascular:     Rate and Rhythm: Normal rate and regular rhythm.     Heart sounds: No murmur heard. Pulmonary:     Effort: Pulmonary effort is normal. No respiratory distress.     Breath sounds: Wheezing present. No rales.   Skin:    General: Skin is warm and dry.   Neurological:     Mental Status: He is alert and oriented to person, place, and time.   Psychiatric:        Behavior: Behavior normal.        Thought Content: Thought content normal.      BP (!) 105/55 (BP Location: Right Arm, Patient Position: Sitting, Cuff Size: Small)   Pulse 62   Temp 97.7 F (36.5 C) (Oral)   Resp 16    Ht 5' 7 (1.702 m)   Wt 174 lb (78.9 kg)   SpO2 97%   BMI 27.25 kg/m  Wt Readings from Last 3 Encounters:  04/30/24 174 lb (78.9 kg)  04/26/24 174 lb 9.6 oz (79.2 kg)  04/16/24 180 lb (81.6 kg)       Assessment & Plan:   Problem List Items Addressed This Visit       Unprioritized   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HCC) (Chronic)   3.9  this past spring. Continue annual surveillance.       Relevant Medications   atorvastatin  (LIPITOR ) 80 MG tablet   Primary hypertension - Primary   BP stable on diltiazem  and aldactone .      Relevant Medications   atorvastatin  (LIPITOR ) 80 MG tablet   Persistent atrial fibrillation (HCC)   Maintained on xarelto , rate controlled on diltiazem .       Relevant Medications   atorvastatin  (LIPITOR ) 80 MG tablet   Hyperlipidemia   Lab Results  Component Value Date   CHOL 142 08/07/2023   HDL 79 08/07/2023   LDLCALC 47 08/07/2023   LDLDIRECT 142.2 12/26/2008   TRIG 81 08/07/2023   CHOLHDL 1.8 08/07/2023   Restart  atorvastatin . This was stopped by palliative care team.        Relevant Medications   atorvastatin  (LIPITOR ) 80 MG tablet   GERD   Stable on omeprazole . Continue same.       Diabetes type 2, controlled (HCC)   Lab Results  Component Value Date   HGBA1C 6.2 01/28/2024   HGBA1C 7.2 (H) 09/16/2023   HGBA1C 6.7 (H) 04/03/2023   Lab Results  Component Value Date   MICROALBUR 1.8 09/16/2023   LDLCALC 47 08/07/2023   CREATININE 1.05 08/14/2023   Stable on glipizide . Denies any symptomatic hypoglycemia.        Relevant Medications   atorvastatin  (LIPITOR ) 80 MG tablet   Other Relevant Orders   HgB A1c   COPD GOLD II    Stable on breztri  and prn xarelto .      Chronic atrial fibrillation with RVR (HCC)   Rate stable on diltiazem , xarelto .       Relevant Medications   atorvastatin  (LIPITOR ) 80 MG tablet   CHF (congestive heart failure) (HCC)   Advised pt to take his diuretics daily.       Relevant Medications    atorvastatin  (LIPITOR ) 80 MG tablet   Anxiety   Feels that he needs lorazepam  more that tramadol . D/C tramadol , restart prn lorazepam .      Relevant Medications   LORazepam  (ATIVAN ) 0.5 MG tablet    I have discontinued Sione Colquitt's traMADol . I have also changed his LORazepam . Additionally, I am having him start on atorvastatin . Lastly, I am having him maintain his acetaminophen , senna, amiodarone , tamsulosin , prochlorperazine , diltiazem , omeprazole , benzonatate , furosemide , potassium chloride , spironolactone , doxycycline , predniSONE , glipiZIDE , ondansetron , levalbuterol , Breztri  Aerosphere, and Xarelto .  Meds ordered this encounter  Medications   atorvastatin  (LIPITOR ) 80 MG tablet    Sig: Take 1 tablet (80 mg total) by mouth daily.    Dispense:  90 tablet    Refill:  1    Supervising Provider:   DOMENICA BLACKBIRD A [[id number]]   LORazepam  (ATIVAN ) 0.5 MG tablet    Sig: Take 1 tablet (0.5 mg total) by mouth at bedtime as needed.    Dispense:  30 tablet    Refill:  0    Supervising Provider:   DOMENICA BLACKBIRD A [[id number]]

## 2024-05-14 ENCOUNTER — Other Ambulatory Visit: Payer: Self-pay | Admitting: Family

## 2024-05-14 ENCOUNTER — Other Ambulatory Visit (HOSPITAL_BASED_OUTPATIENT_CLINIC_OR_DEPARTMENT_OTHER): Payer: Self-pay

## 2024-05-14 NOTE — Telephone Encounter (Signed)
Refill request for tramadol. No longer on med list.   

## 2024-05-16 NOTE — Telephone Encounter (Signed)
 Please advise pt that I declined the tramadol  refill because he cannot take with the lorazepam .

## 2024-05-17 ENCOUNTER — Other Ambulatory Visit (HOSPITAL_BASED_OUTPATIENT_CLINIC_OR_DEPARTMENT_OTHER): Payer: Self-pay

## 2024-05-17 ENCOUNTER — Other Ambulatory Visit: Payer: Self-pay | Admitting: Family

## 2024-05-17 ENCOUNTER — Other Ambulatory Visit (HOSPITAL_COMMUNITY): Payer: Self-pay

## 2024-05-17 DIAGNOSIS — G8929 Other chronic pain: Secondary | ICD-10-CM

## 2024-05-18 NOTE — Telephone Encounter (Signed)
 Patient notified of this information.

## 2024-05-19 ENCOUNTER — Ambulatory Visit

## 2024-05-19 ENCOUNTER — Encounter: Payer: Self-pay | Admitting: Pulmonary Disease

## 2024-05-19 ENCOUNTER — Ambulatory Visit (INDEPENDENT_AMBULATORY_CARE_PROVIDER_SITE_OTHER): Admitting: Pulmonary Disease

## 2024-05-19 ENCOUNTER — Other Ambulatory Visit (HOSPITAL_BASED_OUTPATIENT_CLINIC_OR_DEPARTMENT_OTHER): Payer: Self-pay

## 2024-05-19 VITALS — BP 120/69 | HR 59 | Ht 69.0 in | Wt 180.0 lb

## 2024-05-19 DIAGNOSIS — J441 Chronic obstructive pulmonary disease with (acute) exacerbation: Secondary | ICD-10-CM

## 2024-05-19 DIAGNOSIS — R918 Other nonspecific abnormal finding of lung field: Secondary | ICD-10-CM | POA: Diagnosis not present

## 2024-05-19 DIAGNOSIS — Z951 Presence of aortocoronary bypass graft: Secondary | ICD-10-CM | POA: Diagnosis not present

## 2024-05-19 DIAGNOSIS — R0602 Shortness of breath: Secondary | ICD-10-CM

## 2024-05-19 DIAGNOSIS — J90 Pleural effusion, not elsewhere classified: Secondary | ICD-10-CM

## 2024-05-19 DIAGNOSIS — Z87891 Personal history of nicotine dependence: Secondary | ICD-10-CM

## 2024-05-19 MED ORDER — PREDNISONE 20 MG PO TABS
20.0000 mg | ORAL_TABLET | Freq: Every day | ORAL | 0 refills | Status: DC
Start: 2024-05-19 — End: 2024-06-15
  Filled 2024-05-19: qty 7, 7d supply, fill #0

## 2024-05-19 NOTE — Patient Instructions (Addendum)
 We will get a chest x-ray on you today to make sure you are not building fluid around the lungs  I did call in a prescription for prednisone  for you-should help your breathing  Make sure you start using your nebulizer, this can be used up to 4 times a day if you are feeling short of breath  Continue using Breztri   Follow-up in about 6 weeks  The last ultrasound of the heart you had done shows your heart was doing okay  Graded activities as tolerated  Make sure you stay compliant with your water  pills

## 2024-05-19 NOTE — Progress Notes (Signed)
 Robert Bates    [id number]    Dec 01, 1940  Primary Care Physician:O'Sullivan, Eleanor, NP  Referring Physician: Daryl Eleanor, NP [address] [place],  [place] [address]  Chief complaint:   Patient being seen for shortness of breath  HPI:  Patient with shortness of breath Denies increasing cough Denies chest pain or chest discomfort Has been having some wheezing  He is short of breath with most activities He has not noticed increased swelling of his lower extremities - Compliant with his diuretics including Lasix  and spironolactone   For atrial fibrillation, remains on Xarelto  and amiodarone   History of COPD, heart failure  Compliant with Breztri   He has a history of coronary artery disease status post CABG, atrial fibrillation, congestive heart failure, hypertension,  Past history of lung cancer about 9 years ago for which he received radiation, x-rays over time have shown scarring in the lung  He has chronic back pain for which he is being evaluated for surgery  He has not been losing weight, has a chronic cough with no significant increase in expectoration or severity of a coughing   Outpatient Encounter Medications as of 05/19/2024  Medication Sig   acetaminophen  (TYLENOL ) 500 MG tablet Take 1-2 tablets (500-1,000 mg total) by mouth every 6 (six) hours as needed.   amiodarone  (PACERONE ) 200 MG tablet Take 1 tablet (200 mg total) by mouth daily.   atorvastatin  (LIPITOR ) 80 MG tablet Take 1 tablet (80 mg total) by mouth daily.   benzonatate  (TESSALON ) 100 MG capsule Take 1 capsule (100 mg total) by mouth 3 (three) times daily as needed.   budesonide -glycopyrrolate -formoterol  (BREZTRI  AEROSPHERE) 160-9-4.8 MCG/ACT AERO inhaler Inhale 2 puffs into the lungs 2 (two) times daily.   diltiazem  (CARDIZEM  CD) 120 MG 24 hr capsule Take 1 capsule (120 mg total) by mouth daily.   doxycycline  (VIBRAMYCIN ) 100 MG capsule Take 1 capsule (100 mg total)  by mouth 2 (two) times daily.   furosemide  (LASIX ) 40 MG tablet Take 1 tablet (40 mg total) by mouth daily.   glipiZIDE  (GLUCOTROL  XL) 2.5 MG 24 hr tablet Take 1 tablet (2.5 mg total) by mouth daily with breakfast.   levalbuterol  (XOPENEX ) 0.63 MG/3ML nebulizer solution Take 3 mLs (0.63 mg total) by nebulization every 4 (four) hours as needed for wheezing or shortness of breath.   LORazepam  (ATIVAN ) 0.5 MG tablet Take 1 tablet (0.5 mg total) by mouth at bedtime as needed.   omeprazole  (PRILOSEC) 40 MG capsule Take 1 capsule (40 mg total) by mouth daily.   ondansetron  (ZOFRAN ) 4 MG tablet Take 1 tablet (4 mg total) by mouth every 8 (eight) hours as needed for nausea or vomiting.   potassium chloride  (KLOR-CON ) 10 MEQ tablet Take 1 tablet (10 mEq total) by mouth daily.   predniSONE  (DELTASONE ) 20 MG tablet Take 2 tablets (40 mg total) by mouth daily for 3-5 days for wheezing.   prochlorperazine  (COMPAZINE ) 10 MG tablet Take 10 mg by mouth every 6 (six) hours as needed.   rivaroxaban  (XARELTO ) 20 MG TABS tablet TAKE 1 TABLET BY MOUTH EVERY DAY   senna (SENNA LAXATIVE) 8.6 MG tablet Take 1-4 tablets (8.6-34.4 mg total) by mouth 2 (two) times daily as needed for constipation   spironolactone  (ALDACTONE ) 50 MG tablet Take 1 tablet (50 mg total) by mouth daily.   tamsulosin  (FLOMAX ) 0.4 MG CAPS capsule Take 1 capsule (0.4 mg total) by mouth at bedtime.   No facility-administered  encounter medications on file as of 05/19/2024.    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   COPD (chronic obstructive pulmonary disease) (HCC)    Coronary artery disease    COVID-19 virus infection 04/23/2021   Diabetes type 2, controlled (HCC) 07/31/2017   Diverticulitis 07/29/2023   Dyspnea    Dysrhythmia    a fib   GERD (gastroesophageal reflux disease)    Hematuria    refuses work up or referral - understands risks of morbidity / mortality - 11/2008, 12/2008   Heme positive stool    History of hiatal hernia    History of kidney stones    Hyperlipemia    Meningioma (HCC) 10/25/2013   Follows with Dr. Rockey [place].    Peripheral vascular disease (HCC)    Abdominal Aortic Aneursym   Pneumonia    as a child   Radiation 09/18/15-10/25/15   left lower lobe 70.2 Gy   Seizures (HCC) 02/18/2020   Tobacco abuse     Past Surgical History:  Procedure Laterality Date   CHOLECYSTECTOMY N/A 07/23/2017   Procedure: LAPAROSCOPIC CHOLECYSTECTOMY;  Surgeon: Stevie Herlene Righter, MD;  Location: [facility];  Service: General;  Laterality: N/A;   CLIPPING OF ATRIAL APPENDAGE Left 05/08/2021   Procedure: CLIPPING OF ATRIAL APPENDAGE USING 45 ATRICLIP;  Surgeon: German Bartlett PEDLAR, MD;  Location: [facility];  Service: Open Heart Surgery;  Laterality: Left;   COLONOSCOPY     CORONARY ARTERY BYPASS GRAFT N/A 05/08/2021   Procedure: CORONARY ARTERY BYPASS GRAFTING (CABG)X 3 USING LEFT INTERNAL MAMMARY ARTERY AND RIGHT GREATER SAPEHNOUS VEIN;  Surgeon: German Bartlett PEDLAR, MD;  Location: [facility];  Service: Open Heart Surgery;  Laterality: N/A;   ENDOVEIN HARVEST OF GREATER SAPHENOUS VEIN Right 05/08/2021   Procedure: ENDOVEIN HARVEST OF GREATER SAPHENOUS VEIN;  Surgeon: German Bartlett PEDLAR, MD;  Location: [facility];  Service: Open Heart Surgery;  Laterality: Right;   EYE SURGERY Bilateral    Cataracts removed w/ lens implant   HERNIA REPAIR     Left 36 years ago . Right inguinal hernia repair 10-01-17 Dr. Stevie   INGUINAL HERNIA REPAIR Right 10/01/2017    Procedure: RIGHT INGUINAL HERNIA REPAIR WITH MESH;  Surgeon: Kinsinger, Herlene Righter, MD;  Location: [facility];  Service: General;  Laterality: Right;  TAP BLOCK   INSERTION OF MESH Right 10/01/2017   Procedure: INSERTION OF MESH;  Surgeon: Stevie Herlene Righter, MD;  Location: [facility];  Service: General;  Laterality: Right;   IR THORACENTESIS ASP PLEURAL SPACE W/IMG GUIDE  05/18/2021   IR THORACENTESIS ASP PLEURAL SPACE W/IMG GUIDE  06/07/2021   LEFT HEART CATH AND CORONARY ANGIOGRAPHY N/A 04/20/2021   Procedure: LEFT HEART CATH AND CORONARY ANGIOGRAPHY;  Surgeon: Darron Deatrice LABOR, MD;  Location: [facility];  Service: Cardiovascular;  Laterality: N/A;   TEE WITHOUT CARDIOVERSION N/A 05/08/2021   Procedure: TRANSESOPHAGEAL ECHOCARDIOGRAM (TEE);  Surgeon: German Bartlett PEDLAR, MD;  Location: [facility];  Service: Open  Heart Surgery;  Laterality: N/A;   TONSILLECTOMY     TONSILLECTOMY     VIDEO BRONCHOSCOPY Bilateral 07/26/2015   Procedure: VIDEO BRONCHOSCOPY WITH FLUORO;  Surgeon: Ozell KATHEE America, MD;  Location: [facility];  Service: Cardiopulmonary;  Laterality: Bilateral;   VIDEO BRONCHOSCOPY WITH ENDOBRONCHIAL NAVIGATION N/A 08/23/2015   Procedure: VIDEO BRONCHOSCOPY WITH ENDOBRONCHIAL NAVIGATION;  Surgeon: Dallas KATHEE Jude, MD;  Location: [facility];  Service: Thoracic;  Laterality: N/A;   VIDEO BRONCHOSCOPY WITH ENDOBRONCHIAL ULTRASOUND N/A 08/23/2015   Procedure: VIDEO BRONCHOSCOPY WITH ENDOBRONCHIAL ULTRASOUND;  Surgeon: Dallas KATHEE Jude, MD;  Location: [facility];  Service: Thoracic;  Laterality: N/A;    Family History  Problem Relation Age of Onset   Leukemia Father    Emphysema Father    Learning disabilities Son    Atrial fibrillation Son    Leukemia Other    Stroke Other     Social History   Socioeconomic History   Marital status: Widowed    Spouse name: Not on file   Number of children: 2   Years of education: Not on file   Highest education level: Not on file  Occupational History    Occupation: Retired    Associate Professor: [employer]    Comment: truck Air traffic controller: [employer]  Tobacco Use   Smoking status: Former    Current packs/day: 0.00    Average packs/day: 1 pack/day for 57.0 years (57.0 ttl pk-yrs)    Types: Cigarettes, Cigars    Start date: 08/07/1958    Quit date: 08/08/2015    Years since quitting: 8.7   Smokeless tobacco: Former    Types: Chew    Quit date: 11/04/1958   Tobacco comments:    Will smoke cigar every once in awhile.  Vaping daily started a couple weeks ago.  04/18/22 hfb    Patient states he is still vaping. AB, CMA 09-10-2023  Vaping Use   Vaping status: Some Days  Substance and Sexual Activity   Alcohol use: Not Currently    Alcohol/week: 0.0 standard drinks of alcohol   Drug use: No   Sexual activity: Not Currently  Other Topics Concern   Not on file  Social History Narrative   Not on file   Social Drivers of Health   Financial Resource Strain: Low Risk  (05/22/2023)   Overall Financial Resource Strain (CARDIA)    Difficulty of Paying Living Expenses: Not very hard  Food Insecurity: No Food Insecurity (08/14/2023)   Hunger Vital Sign    Worried About Running Out of Food in the Last Year: Never true    Ran Out of Food in the Last Year: Never true  Transportation Needs: No Transportation Needs (08/14/2023)   PRAPARE - Administrator, Civil Service (Medical): No    Lack of Transportation (Non-Medical): No  Physical Activity: Insufficiently Active (08/05/2021)   Exercise Vital Sign    Days of Exercise per Week: 2 days    Minutes of Exercise per Session: 40 min  Stress: Stress Concern Present (05/22/2023)   [employer] of Occupational Health - Occupational Stress Questionnaire    Feeling of Stress : Rather much  Social Connections: Moderately Isolated (05/13/2022)   Social Connection and Isolation Panel    Frequency of Communication with Friends and Family: Twice a week    Frequency of Social Gatherings with  Friends and Family: Once a week    Attends Religious Services: Never    Database administrator or Organizations:  No    Attends Club or Organization Meetings: Never    Marital Status: Married  Catering manager Violence: Not At Risk (08/04/2023)   Humiliation, Afraid, Rape, and Kick questionnaire    Fear of Current or Ex-Partner: No    Emotionally Abused: No    Physically Abused: No    Sexually Abused: No    Review of Systems  Constitutional:  Positive for fatigue.  Respiratory:  Positive for shortness of breath.     Vitals:   05/19/24 1540  BP: 120/69  Pulse: (!) 59  SpO2: 97%     Physical Exam Constitutional:      Appearance: Normal appearance.  HENT:     Head: Normocephalic.     Mouth/Throat:     Mouth: Mucous membranes are moist.  Eyes:     General: No scleral icterus. Cardiovascular:     Rate and Rhythm: Normal rate and regular rhythm.     Heart sounds: No murmur heard.    No friction rub.  Pulmonary:     Effort: No respiratory distress.     Breath sounds: No stridor. No wheezing or rhonchi.  Musculoskeletal:     Cervical back: No rigidity or tenderness.  Neurological:     Mental Status: He is alert.  Psychiatric:        Mood and Affect: Mood normal.      Data Reviewed: Echocardiogram June 2025 shows diastolic dysfunction  Chest x-ray today-left pleural effusion, some scarring  Assessment:  COPD with exacerbation - He has no increasing cough or increased sputum production - I will call him in a course of steroids  - Will not call in an antibiotic at the present time  He does have a nebulizer at home, encouraged to resume nebulizer use with albuterol  - States he has a good amount of albuterol  to use  - Encouraged to continue using the pulse Breztri   Has been compliant with his diuretics, no increased pedal swelling  Chest x-ray with pleural effusion and scarring  Plan/Recommendations: Nebulization use as needed  Course of prednisone  called  to pharmacy  If no significant improvement in his shortness of breath, can evaluate for a thoracentesis although with good amount of changes on his chest x-ray may also be related to pleural thickening from past history of radiation treatment  Follow-up in about 6 weeks  Encouraged to give us  a call with any significant concerns  Need to consider repeating pulmonary function test, has not had one done in many years    Jennet Epley MD [facility] 05/19/2024, 3:59 PM  CC: Daryl Setter, NP

## 2024-05-21 ENCOUNTER — Ambulatory Visit (INDEPENDENT_AMBULATORY_CARE_PROVIDER_SITE_OTHER): Admitting: Family

## 2024-05-21 ENCOUNTER — Other Ambulatory Visit (HOSPITAL_BASED_OUTPATIENT_CLINIC_OR_DEPARTMENT_OTHER): Payer: Self-pay

## 2024-05-21 VITALS — BP 131/70 | HR 79 | Temp 97.7°F | Resp 16 | Ht 69.0 in | Wt 179.0 lb

## 2024-05-21 DIAGNOSIS — R569 Unspecified convulsions: Secondary | ICD-10-CM

## 2024-05-21 DIAGNOSIS — I5033 Acute on chronic diastolic (congestive) heart failure: Secondary | ICD-10-CM | POA: Diagnosis not present

## 2024-05-21 DIAGNOSIS — I4819 Other persistent atrial fibrillation: Secondary | ICD-10-CM

## 2024-05-21 DIAGNOSIS — J910 Malignant pleural effusion: Secondary | ICD-10-CM | POA: Insufficient documentation

## 2024-05-21 DIAGNOSIS — G8929 Other chronic pain: Secondary | ICD-10-CM

## 2024-05-21 DIAGNOSIS — F419 Anxiety disorder, unspecified: Secondary | ICD-10-CM | POA: Diagnosis not present

## 2024-05-21 DIAGNOSIS — Z59819 Housing instability, housed unspecified: Secondary | ICD-10-CM | POA: Diagnosis not present

## 2024-05-21 DIAGNOSIS — I10 Essential (primary) hypertension: Secondary | ICD-10-CM | POA: Diagnosis not present

## 2024-05-21 DIAGNOSIS — E119 Type 2 diabetes mellitus without complications: Secondary | ICD-10-CM | POA: Diagnosis not present

## 2024-05-21 DIAGNOSIS — Z7984 Long term (current) use of oral hypoglycemic drugs: Secondary | ICD-10-CM

## 2024-05-21 DIAGNOSIS — M549 Dorsalgia, unspecified: Secondary | ICD-10-CM

## 2024-05-21 HISTORY — DX: Malignant pleural effusion: J91.0

## 2024-05-21 MED ORDER — SERTRALINE HCL 50 MG PO TABS
50.0000 mg | ORAL_TABLET | Freq: Every day | ORAL | 3 refills | Status: AC
  Filled 2024-05-21: qty 30, 30d supply, fill #0
  Filled 2024-07-15: qty 30, 30d supply, fill #1

## 2024-05-21 MED ORDER — TRAMADOL HCL 50 MG PO TABS
50.0000 mg | ORAL_TABLET | Freq: Two times a day (BID) | ORAL | 0 refills | Status: DC | PRN
  Filled 2024-05-21: qty 60, 30d supply, fill #0

## 2024-05-21 NOTE — Progress Notes (Addendum)
 Subjective:     Patient ID: Robert Bates, male    DOB: [birth year], 83 y.o.   MRN: [id number]  Chief Complaint  Patient presents with   Back Pain    Will like to discuss tramadol  for pain   Anxiety    Will like to discontinue lorazepam  to start tramadol     Back Pain  Anxiety      Discussed the use of AI scribe software for clinical note transcription with the patient, who gave verbal consent to proceed.  History of Present Illness   Robert Bates is an 83 year old male who presents with worsening shortness of breath and fluid retention.  He experiences worsening shortness of breath, which became severe yesterday. He has a history of fluid retention, with fluid accumulation in the abdomen and possibly lungs, and swelling in the legs and scrotum. He is concerned about a white object seen on a chest x-ray.  He experiences significant anxiety, worsened by recent stress, and uses lorazepam . He has trouble sleeping, with insomnia and burning sensations in his feet and restlessness in his legs.  He suffers from chronic back pain, rated as 7 out of 10 at its worst, and uses tramadol  for temporary relief. He is awaiting further intervention from a back specialist.  He is staying in a motel, which is financially burdensome, and is searching for affordable housing options.    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   COPD (chronic obstructive pulmonary disease) (HCC)    Coronary artery disease    COVID-19 virus infection 04/23/2021   Diabetes type 2, controlled (HCC) 07/31/2017   Diverticulitis 07/29/2023   Dyspnea    Dysrhythmia    a fib   GERD (gastroesophageal reflux disease)    Hematuria    refuses work up or referral - understands risks of morbidity / mortality - 11/2008, 12/2008   Heme positive stool    History of hiatal hernia    History of kidney stones    Hyperlipemia    Meningioma (HCC) 10/25/2013   Follows with Dr. Rockey [place].    Peripheral vascular disease (HCC)    Abdominal Aortic Aneursym   Pneumonia    as a child   Radiation 09/18/15-10/25/15   left lower lobe 70.2 Gy   Seizures (HCC) 02/18/2020   Tobacco abuse     Past Surgical History:  Procedure Laterality Date   CHOLECYSTECTOMY N/A 07/23/2017   Procedure: LAPAROSCOPIC CHOLECYSTECTOMY;  Surgeon: Stevie Herlene Righter, MD;  Location: [facility];  Service: General;  Laterality: N/A;   CLIPPING OF ATRIAL APPENDAGE Left 05/08/2021   Procedure: CLIPPING OF ATRIAL APPENDAGE USING 45 ATRICLIP;  Surgeon: German Bartlett PEDLAR, MD;  Location: [facility];  Service: Open Heart Surgery;  Laterality: Left;   COLONOSCOPY     CORONARY ARTERY BYPASS GRAFT N/A 05/08/2021   Procedure:  CORONARY ARTERY BYPASS GRAFTING (CABG)X 3 USING LEFT INTERNAL MAMMARY ARTERY AND RIGHT GREATER SAPEHNOUS VEIN;  Surgeon: German Bartlett PEDLAR, MD;  Location: [facility];  Service: Open Heart Surgery;  Laterality: N/A;   ENDOVEIN HARVEST OF GREATER SAPHENOUS VEIN Right 05/08/2021   Procedure: ENDOVEIN HARVEST OF GREATER SAPHENOUS VEIN;  Surgeon: German Bartlett PEDLAR, MD;  Location: [facility];  Service: Open Heart Surgery;  Laterality: Right;   EYE SURGERY Bilateral    Cataracts removed w/ lens implant   HERNIA REPAIR     Left 36 years ago . Right inguinal hernia repair 10-01-17 Dr. Stevie   INGUINAL HERNIA REPAIR Right 10/01/2017   Procedure: RIGHT INGUINAL HERNIA REPAIR WITH  MESH;  Surgeon: Kinsinger, Herlene Righter, MD;  Location: [facility];  Service: General;  Laterality: Right;  TAP BLOCK   INSERTION OF MESH Right 10/01/2017   Procedure: INSERTION OF MESH;  Surgeon: Stevie Herlene Righter, MD;  Location: [facility];  Service: General;  Laterality: Right;   IR THORACENTESIS ASP PLEURAL SPACE W/IMG GUIDE  05/18/2021   IR THORACENTESIS ASP PLEURAL SPACE W/IMG GUIDE  06/07/2021   LEFT HEART CATH AND CORONARY ANGIOGRAPHY N/A 04/20/2021   Procedure: LEFT HEART CATH AND CORONARY ANGIOGRAPHY;  Surgeon: Darron Deatrice LABOR, MD;  [facility];  Service: Cardiovascular;  Laterality: N/A;   TEE WITHOUT CARDIOVERSION N/A 05/08/2021   Procedure: TRANSESOPHAGEAL ECHOCARDIOGRAM (TEE);  Surgeon: German Bartlett PEDLAR, MD;  Location: [facility];  Service: Open Heart Surgery;  Laterality: N/A;   TONSILLECTOMY     TONSILLECTOMY     VIDEO BRONCHOSCOPY Bilateral 07/26/2015   Procedure: VIDEO BRONCHOSCOPY WITH FLUORO;  Surgeon: Ozell KATHEE America, MD;  [facility];  Service: Cardiopulmonary;  Laterality: Bilateral;   VIDEO BRONCHOSCOPY WITH ENDOBRONCHIAL NAVIGATION N/A 08/23/2015   Procedure: VIDEO BRONCHOSCOPY WITH ENDOBRONCHIAL NAVIGATION;  Surgeon: Dallas KATHEE Jude, MD;  [facility];  Service: Thoracic;  Laterality: N/A;   VIDEO BRONCHOSCOPY WITH ENDOBRONCHIAL ULTRASOUND N/A 08/23/2015   Procedure: VIDEO BRONCHOSCOPY WITH ENDOBRONCHIAL ULTRASOUND;  Surgeon: Dallas KATHEE Jude, MD;  [facility]: [facility];  Service: Thoracic;  Laterality: N/A;    Family History  Problem Relation Age of Onset   Leukemia Father    Emphysema Father    Learning disabilities Son    Atrial fibrillation Son    Leukemia Other    Stroke Other     Social History   Socioeconomic History   Marital status: Widowed    Spouse name: Not on file   Number of children: 2   Years of education: Not on file   Highest education level: Not on file  Occupational History   Occupation: Retired    Associate Professor: [employer]    Comment: truck Air traffic controller: [employer]  Tobacco Use   Smoking status: Former    Current packs/day: 0.00    Average packs/day: 1 pack/day for 57.0 years (57.0 ttl pk-yrs)    Types: Cigarettes, Cigars    Start date: 08/07/1958    Quit date: 08/08/2015    Years since quitting: 8.7   Smokeless tobacco: Former    Types: Chew    Quit date: 11/04/1958   Tobacco comments:    Will smoke cigar every once in awhile.  Vaping daily started a couple weeks ago.  04/18/22 hfb    Patient states he is still vaping. AB, CMA 09-10-2023  Vaping Use   Vaping status: Some Days  Substance and Sexual Activity   Alcohol use: Not  Currently    Alcohol/week: 0.0 standard drinks of alcohol   Drug use: No   Sexual activity: Not Currently  Other Topics Concern   Not on file  Social History Narrative   Not on file   Social Drivers of Health   Financial Resource Strain: Low Risk  (05/22/2023)   Overall Financial Resource Strain (CARDIA)    Difficulty of Paying Living Expenses: Not very hard  Food Insecurity: No Food Insecurity (08/14/2023)   Hunger Vital Sign    Worried About Running Out of Food in the Last Year: Never true    Ran Out of Food in the Last Year: Never true  Transportation Needs: No Transportation Needs (08/14/2023)   PRAPARE - Administrator, Civil Service (Medical): No    Lack of Transportation (Non-Medical): No  Physical Activity: Insufficiently Active (08/05/2021)   Exercise Vital Sign    Days of Exercise per Week: 2 days    Minutes of Exercise per Session: 40 min  Stress: Stress Concern Present (05/22/2023)   [employer] of Occupational Health - Occupational Stress Questionnaire    Feeling of Stress : Rather much  Social Connections: Moderately Isolated (05/13/2022)   Social Connection and Isolation Panel    Frequency of Communication with Friends and Family: Twice a week    Frequency of Social Gatherings with Friends and Family: Once a week    Attends  Religious Services: Never    Database administrator or Organizations: No    Attends Banker Meetings: Never    Marital Status: Married  Catering manager Violence: Not At Risk (08/04/2023)   Humiliation, Afraid, Rape, and Kick questionnaire    Fear of Current or Ex-Partner: No    Emotionally Abused: No    Physically Abused: No    Sexually Abused: No    Outpatient Medications Prior to Visit  Medication Sig Dispense Refill   acetaminophen  (TYLENOL ) 500 MG tablet Take 1-2 tablets (500-1,000 mg total) by mouth every 6 (six) hours as needed. 30 tablet 0   amiodarone  (PACERONE ) 200 MG tablet Take 1 tablet (200 mg total) by mouth daily. 90 tablet 3   atorvastatin  (LIPITOR ) 80 MG tablet Take 1 tablet (80 mg total) by mouth daily. 90 tablet 1   budesonide -glycopyrrolate -formoterol  (BREZTRI  AEROSPHERE) 160-9-4.8 MCG/ACT AERO inhaler Inhale 2 puffs into the lungs 2 (two) times daily.     diltiazem  (CARDIZEM  CD) 120 MG 24 hr capsule Take 1 capsule (120 mg total) by mouth daily. 90 capsule 1   furosemide  (LASIX ) 40 MG tablet Take 1 tablet (40 mg total) by mouth daily. 90 tablet 0   glipiZIDE  (GLUCOTROL  XL) 2.5 MG 24 hr tablet Take 1 tablet (2.5 mg total) by mouth daily with breakfast. 90 tablet 0   levalbuterol  (XOPENEX ) 0.63 MG/3ML nebulizer solution Take 3 mLs (0.63 mg total) by nebulization every 4 (four) hours as needed for wheezing or shortness of breath. 75 mL 12   omeprazole  (PRILOSEC) 40 MG capsule Take 1 capsule (40 mg total) by mouth daily. 90 capsule 1   ondansetron  (ZOFRAN ) 4 MG tablet Take 1 tablet (4 mg total) by mouth every 8 (eight) hours as needed for nausea or vomiting. 30 tablet 1   potassium chloride  (KLOR-CON ) 10 MEQ tablet Take 1 tablet (10 mEq total) by mouth daily. 90 tablet 0   predniSONE  (DELTASONE ) 20 MG tablet Take 1 tablet (20 mg total) by mouth daily with breakfast. 7 tablet 0   prochlorperazine  (COMPAZINE ) 10 MG tablet  Take 10 mg by mouth every 6 (six) hours as  needed.     rivaroxaban  (XARELTO ) 20 MG TABS tablet TAKE 1 TABLET BY MOUTH EVERY DAY 90 tablet 1   senna (SENNA LAXATIVE) 8.6 MG tablet Take 1-4 tablets (8.6-34.4 mg total) by mouth 2 (two) times daily as needed for constipation 100 tablet 0   spironolactone  (ALDACTONE ) 50 MG tablet Take 1 tablet (50 mg total) by mouth daily. 90 tablet 0   tamsulosin  (FLOMAX ) 0.4 MG CAPS capsule Take 1 capsule (0.4 mg total) by mouth at bedtime. 90 capsule 1   LORazepam  (ATIVAN ) 0.5 MG tablet Take 1 tablet (0.5 mg total) by mouth at bedtime as needed. 30 tablet 0   benzonatate  (TESSALON ) 100 MG capsule Take 1 capsule (100 mg total) by mouth 3 (three) times daily as needed. 30 capsule 0   doxycycline  (VIBRAMYCIN ) 100 MG capsule Take 1 capsule (100 mg total) by mouth 2 (two) times daily. 20 capsule 0   No facility-administered medications prior to visit.    Allergies  Allergen Reactions   Iodine Swelling    Neck, gland swelling   Iohexol  Swelling    Neck, gland swelling   Metformin  And Related Diarrhea   Metformin  Nausea Only    Review of Systems  Musculoskeletal:  Positive for back pain.       Objective:    Physical Exam Exam conducted with a chaperone present.  Constitutional:      General: He is not in acute distress.    Appearance: He is well-developed.  HENT:     Head: Normocephalic and atraumatic.  Cardiovascular:     Rate and Rhythm: Normal rate and regular rhythm.     Heart sounds: No murmur heard. Pulmonary:     Effort: Pulmonary effort is normal. No respiratory distress.     Breath sounds: Normal breath sounds. No wheezing or rales.  Genitourinary:    Penis: Normal and uncircumcised.      Testes: Normal.  Musculoskeletal:        General: Swelling (3+ bilateral LE edema) present.  Skin:    General: Skin is warm and dry.  Neurological:     Mental Status: He is alert and oriented to person, place, and time.  Psychiatric:        Behavior: Behavior normal.        Thought  Content: Thought content normal.   Abd: protuberant   BP 131/70 (BP Location: Right Arm, Patient Position: Sitting, Cuff Size: Normal)   Pulse 79   Temp 97.7 F (36.5 C) (Oral)   Resp 16   Ht 5' 9 (1.753 m)   Wt 179 lb (81.2 kg)   SpO2 98%   BMI 26.43 kg/m  Wt Readings from Last 3 Encounters:  05/21/24 179 lb (81.2 kg)  05/19/24 180 lb (81.6 kg)  04/30/24 174 lb (78.9 kg)       Assessment & Plan:   Problem List Items Addressed This Visit       Unprioritized   Seizures (HCC)   No recent seizures. Monitor.       Primary hypertension   BP Readings from Last 3 Encounters:  05/21/24 131/70  05/19/24 120/69  04/30/24 (!) 105/55   BP is stable.       Relevant Orders   Basic Metabolic Panel (BMET)   Persistent atrial fibrillation (HCC)   On xarelto /diltiazem - management per cardiology.       Housing insecurity   New. Living in a motel. Having  trouble finding affordable housing.  Will refer to social work for [employer] with housing resources.       Relevant Orders   AMB Referral VBCI Care Management   Diabetes type 2, controlled (HCC)   Lab Results  Component Value Date   HGBA1C 6.2 01/28/2024   Plan to update A1C next visit.       Chronic back pain - Primary   Reviewed [place] Controlled substance registry. Contract up to date. Refilled tramadol .       Relevant Medications   traMADol  (ULTRAM ) 50 MG tablet   sertraline (ZOLOFT) 50 MG tablet   CHF (congestive heart failure) (HCC)   Appears volume overloaded. Advised pt to increase furosemide  to bid for 3 days then back to once daily.  Let us  know if if no improvement in his LE edema with this adjustment.   He has a left pleural effusion which is being monitored by Pulmonology. They are considering thoracentesis if pt's breathing does not improve with prednisone /breztri .       Anxiety   Uncontrolled. Advised pt to d/c lorazepam  since he prefers to restart tramadol . Will add sertraline.       Relevant  Medications   sertraline (ZOLOFT) 50 MG tablet    I have discontinued Robert Bates's benzonatate , doxycycline , and LORazepam . I am also having him start on sertraline. Additionally, I am having him maintain his acetaminophen , senna, amiodarone , tamsulosin , prochlorperazine , diltiazem , omeprazole , furosemide , potassium chloride , spironolactone , glipiZIDE , ondansetron , levalbuterol , Breztri  Aerosphere, Xarelto , atorvastatin , predniSONE , and traMADol .  Meds ordered this encounter  Medications   traMADol  (ULTRAM ) 50 MG tablet    Sig: Take 1 tablet (50 mg total) by mouth every 12 (twelve) hours as needed.    Dispense:  60 tablet    Refill:  0    Supervising Provider:   DOMENICA BLACKBIRD A [[id number]]   sertraline (ZOLOFT) 50 MG tablet    Sig: Take 1 tablet (50 mg total) by mouth daily.    Dispense:  30 tablet    Refill:  3    Supervising Provider:   DOMENICA BLACKBIRD A [[id number]]

## 2024-05-21 NOTE — Assessment & Plan Note (Addendum)
 Uncontrolled. Advised pt to d/c lorazepam  since he prefers to restart tramadol . Will add sertraline.

## 2024-05-21 NOTE — Assessment & Plan Note (Signed)
 On xarelto /diltiazem - management per cardiology.

## 2024-05-21 NOTE — Addendum Note (Signed)
 Addended by: DARYL SETTER on: 05/21/2024 12:50 PM   Modules accepted: Orders

## 2024-05-21 NOTE — Assessment & Plan Note (Signed)
 Lab Results  Component Value Date   HGBA1C 6.2 01/28/2024   Plan to update A1C next visit.

## 2024-05-21 NOTE — Patient Instructions (Signed)
 VISIT SUMMARY:  You visited us  today due to worsening shortness of breath and fluid retention. We also discussed your anxiety, chronic back pain, and housing situation.  YOUR PLAN:  SHORTNESS OF BREATH AND FLUID RETENTION: You are experiencing worsening shortness of breath and fluid retention, with swelling in your legs and abdomen. -Increase Lasix  to twice daily for three days.  ANXIETY: Your anxiety has been worse recently, and you are also experiencing trouble sleeping. -Start taking sertraline. Begin with half a tablet for the first week, then increase to a full tablet in the second week.  BACK PAIN: You have chronic back pain that is somewhat relieved by tramadol . -We have refilled your tramadol  prescription.  HOUSING INSTABILITY: You are currently staying in a motel and looking for more affordable housing options. -Continue searching for affordable housing options. Let us  know if you need any assistance or resources.

## 2024-05-21 NOTE — Assessment & Plan Note (Addendum)
 Appears volume overloaded. Advised pt to increase furosemide  to bid for 3 days then back to once daily.  Let us  know if if no improvement in his LE edema with this adjustment.   He has a left pleural effusion which is being monitored by Pulmonology. They are considering thoracentesis if pt's breathing does not improve with prednisone /breztri .

## 2024-05-21 NOTE — Assessment & Plan Note (Signed)
 New. Living in a motel. Having trouble finding affordable housing.  Will refer to social work for [employer] with housing resources.

## 2024-05-21 NOTE — Assessment & Plan Note (Signed)
 Reviewed [place] Controlled substance registry. Contract up to date. Refilled tramadol .

## 2024-05-21 NOTE — Assessment & Plan Note (Signed)
 No recent seizures. Monitor.

## 2024-05-21 NOTE — Assessment & Plan Note (Signed)
 BP Readings from Last 3 Encounters:  05/21/24 131/70  05/19/24 120/69  04/30/24 (!) 105/55   BP is stable.

## 2024-05-24 ENCOUNTER — Other Ambulatory Visit: Payer: Self-pay | Admitting: Pain Medicine

## 2024-05-24 ENCOUNTER — Telehealth: Payer: Self-pay

## 2024-05-24 NOTE — Progress Notes (Signed)
 Complex Care Management Note  Care Guide Note 05/24/2024 Name: Robert Bates MRN: [id number] DOB: 1941/02/06  [name] is a 83 y.o. year old male who sees Daryl Setter, NP for primary care. I reached out to Edrick Getting by phone today to offer complex care management services.  [name] was given information about Complex Care Management services today including:   The Complex Care Management services include support from the care team which includes your Nurse Care Manager, Clinical Social Worker, or Pharmacist.  The Complex Care Management team is here to help remove barriers to the health concerns and goals most important to you. Complex Care Management services are voluntary, and the patient may decline or stop services at any time by request to their care team member.   Complex Care Management Consent Status: Patient agreed to services and verbal consent obtained.   Follow up plan:  Telephone appointment with complex care management team member scheduled for:  05/27/24 at 11:30 a.m.   Encounter Outcome:  Patient Scheduled  Dreama [facility]  [facility], [facility] Care Management Assistant Direct Dial: [phone]  Fax: [phone]

## 2024-05-27 ENCOUNTER — Other Ambulatory Visit (HOSPITAL_BASED_OUTPATIENT_CLINIC_OR_DEPARTMENT_OTHER): Payer: Self-pay

## 2024-05-27 ENCOUNTER — Other Ambulatory Visit: Payer: Self-pay

## 2024-05-27 ENCOUNTER — Encounter (HOSPITAL_COMMUNITY): Payer: Self-pay | Admitting: Pain Medicine

## 2024-05-27 MED ORDER — HYDROCODONE-ACETAMINOPHEN 5-325 MG PO TABS
1.0000 | ORAL_TABLET | Freq: Four times a day (QID) | ORAL | 0 refills | Status: DC | PRN
  Filled 2024-05-27: qty 20, 5d supply, fill #0

## 2024-05-27 NOTE — Patient Instructions (Signed)
 Visit Information  Thank you for taking time to visit with me today. Please don't hesitate to contact me if I can be of assistance to you before our next scheduled appointment.  Our next appointment is by telephone on 06/01/2024 at 11;30 am Please call the care guide team at [phone] if you need to cancel or reschedule your appointment.   Following is a copy of your care plan:   Goals Addressed             This Visit's Progress    VBCI Social Work Care Plan: LCSW       Problems:   Disease Management support and education needs related to Anxiety with Social Anxiety, and Housing   CSW Clinical Goal(s):   Over the next 90 days the Patient will work with Child psychotherapist to address concerns related to housing.  Interventions:  Social Determinants of Health in Patient with COPD and Anxiety with Social Anxiety,: SDOH assessments completed: Housing  Evaluation of current treatment plan related to unmet needs Patient will be provided housing resources to assist with finding housing. LCSW and patient discussed resources that patient has already explored. LCSW discussed and reviewed current bills and income to assess housing budget with affordable housing. Mental Health Motivational interviewing Active listening Solution focused LCSW discussed with patient possible natural resources for assisting with housing. LCSW completed PHQ2 and screened negative. LCSW completed GAD7 completed and patient screened mild for anxiety. Patient is taking medication. Patient is declining therapy at this time however would revisit at a later time.  Patient Goals/Self-Care Activities:  Continue taking your medication as prescribed.               Coordinate with LCSW to assist with housing resources.  Plan:   Telephone follow up appointment with care management team member scheduled for:  06/01/2024 at 11:30 am        Please call 911 if you are experiencing a Mental Health or Behavioral Health  Crisis or need someone to talk to.  Patient verbalizes understanding of instructions and care plan provided today and agrees to view in MyChart. Active MyChart status and patient understanding of how to access instructions and care plan via MyChart confirmed with patient.     Olam Ally, MSW, LCSW [place]  [facility], [facility] Licensed Clinical Social Worker Direct Dial: [phone]

## 2024-05-27 NOTE — Progress Notes (Signed)
 SDW CALL  Patient was given pre-op instructions over the phone. The opportunity was given for the patient to ask questions. No further questions asked. Patient verbalized understanding of instructions given.   PCP - Eleanor Ponto Cardiologist - Dr. Aleene Finely Pulmonologist - Dr. Jennet Epley  PPM/ICD - denies   Chest x-ray - 05/25/24 EKG - 04/08/24 Stress Test - 04/16/24 ECHO - 04/08/24 Cardiac Cath - 04/20/21   Sleep Study - denies   Fasting Blood Sugar - patient states that he does not check his blood sugar at home   Last dose of GLP1 agonist-  n/a GLP1 instructions:  n/a  Blood Thinner Instructions:  per patient, last dose of Xarelto  was 7/21 Aspirin  Instructions: n/a  ERAS Protcol - clears until 1000   COVID TEST- n/a   Anesthesia review: yes - cardiac history  Patient states that he has only had 1 seizure, in 2021  Patient denies shortness of breath, fever, cough and chest pain over the phone call   All instructions explained to the patient, with a verbal understanding of the material. Patient agrees to go over the instructions while at home for a better understanding.

## 2024-05-27 NOTE — Anesthesia Preprocedure Evaluation (Signed)
 Anesthesia Evaluation    Airway        Dental   Pulmonary Patient abstained from smoking., former smoker          Cardiovascular      Neuro/Psych    GI/Hepatic   Endo/Other  diabetes    Renal/GU      Musculoskeletal   Abdominal   Peds  Hematology   Anesthesia Other Findings   Reproductive/Obstetrics                              Anesthesia Physical Anesthesia Plan  ASA:   Anesthesia Plan:    Post-op Pain Management:    Induction:   PONV Risk Score and Plan:   Airway Management Planned:   Additional Equipment:   Intra-op Plan:   Post-operative Plan:   Informed Consent:   Plan Discussed with:   Anesthesia Plan Comments: (PAT note by Lynwood Hope, PA-C: 83 year old male follows with cardiology for history of HTN, paroxysmal atrial fibrillation on Xarelto  and amiodarone , PVD, AAA, 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  Most recent echo 04/08/2024 showed EF 60 to 65%, grade 2 DD, normal RV, mild TR.  Lexiscan  04/2024 showed inferolateral infarction as partially reversible and medium size defect in the anterior wall that showed reversible defect suggestive of anterior ischemia.  He was seen by Dr. Alveta on 04/26/2024 for preop evaluation.  Per note, He informed me that he will not be having general anesthesia. Will be getting MAC. He is severely limited and would like to have this surgery soon.  Given his hx of CAD and CABG and  abnormalities on his myoview  study , he is at moderate - high risk for his procedure but he appears to be stable and optimized for his surgery. He may hold his Xarelto  for 3 days prior to procedure.  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  He was seen by pulmonologist Dr. Milagros on 05/19/2024 for COPD exacerbation, no increased cough or increased sputum production, just increased shortness of breath.  He was treated with a course of steroids.  He was subsequently seen by his PCP Eleanor Ponto, NP on 05/21/2024 and noted to be fluid overloaded he was recommended to increase furosemide  to twice daily for 3 days then back to once daily.  On preop phone call, pt reported he has baseline SOB but was able to walk up multiple flights of steps while at the beach this weekend.   Patient reports last dose of Xarelto  was 05/24/2024.  History reviewed with anesthesiologist Dr. Maryclare.  He advised that there is some chance of requiring general anesthesia for this case.  Patient will be evaluated day of surgery by assigned anesthesiologist and risks will be discussed with patient and surgeon.  EKG 03/05/24: Normal sinus rhythm.  Rate 61. Left anterior fascicular block. Cannot rule out Anterior infarct , age undetermined  CT Chest 02/24/24: IMPRESSION: 1. 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 No findings suspicious for recurrent tumor. 2. Persistent moderate-sized left pleural effusion and left lower lobe scarring changes. 3. No pulmonary metastatic disease or upper abdominal metastatic disease. 4. Stable emphysematous changes and pulmonary scarring. 5. Stable sclerotic T8  lesion. 6. 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  Nuclear stress 04/16/2024:  Findings are consistent with ischemia. The study is intermediate risk.   No ST deviation was noted.   LV perfusion is abnormal. There is evidence of ischemia. Defect 1: There is a medium defect with mild reduction in uptake present in the apical to basal inferolateral location(s) that is partially reversible. There is normal wall motion in the defect area. Perfusion improves with stress, does  not support ischemia. Could be mild infarct or hibernating myocardium. The defect is consistent with abnormal perfusion in the LCx territory. Defect 2: There is a medium defect with mild reduction in uptake present in the apical to basal anterior location(s) that is reversible. There is normal wall motion in the defect area. Consistent with ischemia. The defect is consistent with abnormal perfusion in the LAD territory.   Left ventricular function is normal. Nuclear stress EF: 74%. The left ventricular ejection fraction is hyperdynamic (>65%). End diastolic cavity size is normal. End systolic cavity size is normal.   CT images were obtained for attenuation correction and were examined for the presence of coronary calcium  when appropriate.   Coronary calcium  was present on the attenuation correction CT images. Severe coronary calcifications were present. Coronary calcifications were present in the left anterior descending artery distribution(s). Left circumflex obscured by artifact from left atrial appendage closure device.   Prior study not available for comparison.  TTE 04/08/2024: 1. Left ventricular ejection fraction, by estimation, is 60 to 65%. The  left ventricle has normal function. The left ventricle has no regional  wall motion abnormalities. There is mild concentric left ventricular  hypertrophy. Left ventricular diastolic  parameters are consistent with Grade II diastolic dysfunction  (pseudonormalization).   2. Right ventricular systolic function is normal. The right ventricular  size is normal. There is normal pulmonary artery systolic pressure.   3. Left atrial size was mildly dilated.   4. The mitral valve is normal in structure. No evidence of mitral valve  regurgitation. No evidence of mitral stenosis.   5. The aortic valve is tricuspid. Aortic valve regurgitation is not  visualized. No aortic stenosis is present.   6. The inferior vena cava is normal in size with greater than  50%  respiratory variability, suggesting right atrial pressure of 3 mmHg.   )         Anesthesia Quick Evaluation

## 2024-05-27 NOTE — Progress Notes (Signed)
 Notified Levon that patient does not have anyone to stay with him after surgery and that patient called requesting to spend the night.   Levon states that she was going to notify Dr. Darlis.

## 2024-05-27 NOTE — Progress Notes (Signed)
 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  Most recent echo 04/08/2024 showed EF 60 to 65%, grade 2 DD, normal RV, mild TR.  Lexiscan  04/2024 showed inferolateral infarction as partially reversible and medium size defect in the anterior wall that showed reversible defect suggestive of anterior ischemia.  He was seen by Dr. Alveta on 04/26/2024 for preop evaluation.  Per note, He informed me that he will not be having general anesthesia. Will be getting MAC. He is severely limited and would like to have this surgery soon.  Given his hx of CAD and CABG and  abnormalities on his myoview  study , he is at moderate - high risk for his procedure but he appears to be stable and optimized for his surgery. He may hold his Xarelto  for 3 days prior to procedure.  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  He was seen by pulmonologist Dr. Milagros on 05/19/2024 for COPD exacerbation, no increased cough or increased sputum production, just increased shortness of breath.  He was treated with a course of steroids.  He was subsequently seen by his PCP Eleanor Ponto, NP on 05/21/2024 and noted to be fluid overloaded he was recommended to increase furosemide  to twice daily for 3 days then back to once daily.  On preop phone call, pt reported he has baseline SOB but was able to walk up multiple flights of steps while at the beach this weekend.   Patient reports last dose of Xarelto  was 05/24/2024.  History reviewed with anesthesiologist Dr. Maryclare.  He advised that there is some chance of requiring general anesthesia for  this case.  Patient will be evaluated day of surgery by assigned anesthesiologist and risks will be discussed with patient and surgeon.  EKG 03/05/24: Normal sinus rhythm.  Rate 61. Left anterior fascicular block. Cannot rule out Anterior infarct , age undetermined  CT Chest 02/24/24: IMPRESSION: 1. 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 No findings suspicious for recurrent tumor. 2. Persistent moderate-sized left pleural effusion and left lower lobe scarring changes. 3. No pulmonary metastatic disease or upper abdominal metastatic disease. 4. Stable emphysematous changes and pulmonary scarring. 5. Stable sclerotic T8 lesion. 6. 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  Nuclear stress 04/16/2024:  Findings are consistent with ischemia. The study is intermediate risk.   No ST deviation was noted.   LV perfusion is abnormal. There is evidence of ischemia. Defect 1: There is a medium defect with mild reduction in uptake present in the apical to basal inferolateral location(s) that is partially reversible. There is normal wall motion in the defect area. Perfusion improves with stress, does not support ischemia. Could be mild infarct or hibernating myocardium. The defect is consistent with abnormal perfusion in the LCx territory. Defect 2: There is a medium defect with mild reduction in uptake present in the apical to basal anterior location(s) that is reversible. There is normal wall motion in the defect area. Consistent with ischemia. The defect is consistent with abnormal perfusion in the LAD territory.   Left ventricular function is normal. Nuclear stress EF: 74%. The left  ventricular ejection fraction is hyperdynamic (>65%). End diastolic cavity size is normal. End systolic cavity size is normal.   CT images were obtained for attenuation correction and were examined for the presence of coronary calcium  when  appropriate.   Coronary calcium  was present on the attenuation correction CT images. Severe coronary calcifications were present. Coronary calcifications were present in the left anterior descending artery distribution(s). Left circumflex obscured by artifact from left atrial appendage closure device.   Prior study not available for comparison.  TTE 04/08/2024: 1. Left ventricular ejection fraction, by estimation, is 60 to 65%. The  left ventricle has normal function. The left ventricle has no regional  wall motion abnormalities. There is mild concentric left ventricular  hypertrophy. Left ventricular diastolic  parameters are consistent with Grade II diastolic dysfunction  (pseudonormalization).   2. Right ventricular systolic function is normal. The right ventricular  size is normal. There is normal pulmonary artery systolic pressure.   3. Left atrial size was mildly dilated.   4. The mitral valve is normal in structure. No evidence of mitral valve  regurgitation. No evidence of mitral stenosis.   5. The aortic valve is tricuspid. Aortic valve regurgitation is not  visualized. No aortic stenosis is present.   6. The inferior vena cava is normal in size with greater than 50%  respiratory variability, suggesting right atrial pressure of 3 mmHg.     Lynwood Geofm RIGGERS [facility] Short Stay Center/Anesthesiology Phone [phone] 05/27/2024 10:58 AM

## 2024-05-27 NOTE — Patient Outreach (Addendum)
 Complex Care Management   Visit Note  05/27/2024  Name:  Robert Bates MRN: [id number] DOB: Aug 01, 1941  Situation: Referral received for Complex Care Management related to Mental/Behavioral Health diagnosis mild depression and SDOH Barriers:  Housing . I obtained verbal consent from Patient.  Visit completed with patient  on the phone.  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   COPD (chronic obstructive pulmonary disease) (HCC)    Coronary artery disease    COVID-19 virus infection 04/23/2021   Diabetes type 2, controlled (HCC) 07/31/2017   Diverticulitis 07/29/2023   Dyspnea    Dysrhythmia    a fib   GERD (gastroesophageal reflux disease)    Hematuria    refuses work up or referral - understands risks of morbidity / mortality - 11/2008, 12/2008   Heme positive stool    History of hiatal hernia    History of kidney stones    Hyperlipemia    Meningioma (HCC) 10/25/2013   Follows with Dr. Rockey [place].    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)      Cardiovascular Cardiovascular Symptoms Reported: No symptoms  reported Does patient have uncontrolled Hypertension?: No    Respiratory Respiratory Symptoms Reported: No symptoms reported    Endocrine Endocrine Symptoms Reported: No symptoms reported Is patient diabetic?: Yes Is patient checking blood sugars at home?: No    Gastrointestinal Gastrointestinal Symptoms Reported: No symptoms reported      Genitourinary Genitourinary Symptoms Reported: No symptoms reported    Integumentary Integumentary Symptoms Reported: No symptoms reported    Musculoskeletal Musculoskelatal Symptoms Reviewed: Back pain Additional Musculoskeletal Details: Patient report having back surgery tomorrow for pinched nerve Musculoskeletal Management Strategies: Medication therapy, Medical device, Adequate rest Falls in the past year?: Yes Number of falls in past year: 2 or more Was there an injury with Fall?: No Fall Risk Category Calculator: 2 Patient Fall Risk Level: Moderate Fall Risk Patient at Risk for Falls Due to: History of fall(s) Fall risk Follow up: Education provided (patient is reminded to use walker when walking)  Psychosocial Psychosocial Symptoms Reported: Anxiety - if selected complete GAD Additional Psychological Details: Patient screened mild for anxiety Behavioral Management Strategies: Adequate rest, Medication therapy Behavioral Health Self-Management Outcome: 3 (uncertain) Major Change/Loss/Stressor/Fears (CP): Environment (Patient currently does not have stable housing) Techniques to [employer] with Loss/Stress/Change: Meditation (Patient wants to revisit therapy at a later time) Quality of Family Relationships: stressful, supportive Do you feel physically threatened by others?: No      05/27/2024   11:46 AM  Depression screen PHQ 2/9  Decreased Interest 0  Down, Depressed, Hopeless 0  PHQ - 2 Score 0    There were no vitals filed for this visit.  Medications Reviewed Today     Reviewed by Sherren Olam FORBES KEN (Social Worker) on 05/27/24  at 1221  Med List Status: <None>   Medication Order Taking? Sig Documenting Provider Last Dose Status Informant  acetaminophen  (TYLENOL ) 500 MG tablet [id number]  Take 1-2 tablets (500-1,000 mg total) by mouth every 6 (six) hours as needed. Dwan Kyla HERO, PA-C  Active Self           Med Note JUSTINO, TAMMY B   Wed Jul 16, 2023 11:25 AM)    amiodarone  (PACERONE ) 200 MG tablet [id number]  Take 1 tablet (200 mg total) by mouth daily. Nahser, Aleene PARAS, MD  Active Self  atorvastatin  (LIPITOR ) 80 MG tablet [id number]  Take 1 tablet (80 mg total) by mouth daily. Daryl Setter, NP  Active Self  budesonide -glycopyrrolate -formoterol  (BREZTRI  AEROSPHERE) 160-9-4.8 MCG/ACT AERO inhaler [id number]  Inhale 2 puffs into the lungs 2 (two) times daily. Daryl Setter, NP  Active Self  diltiazem  (CARDIZEM  CD) 120 MG 24 hr capsule [id number]  Take 1 capsule (120 mg total) by mouth daily. Daryl Setter, NP  Active Self  furosemide  (LASIX ) 40 MG tablet [id number]  Take 1 tablet (40 mg total) by mouth daily. Paz, Jose E, MD  Active Self  glipiZIDE  (GLUCOTROL  XL) 2.5 MG 24 hr tablet [id number]  Take 1 tablet (2.5 mg total) by mouth daily with breakfast. Daryl Setter, NP  Active Self  HYDROcodone -acetaminophen  (NORCO/VICODIN) 5-325 MG tablet [id number]  Take 1 tablet by mouth every 6 (six) hours as needed for pain.   Active   levalbuterol  (XOPENEX ) 0.63 MG/3ML nebulizer solution [id number]  Take 3 mLs (0.63 mg total) by nebulization every 4 (four) hours as needed for wheezing or shortness of breath. Daryl Setter, NP  Active Self  omeprazole  (PRILOSEC) 40 MG capsule [id number]  Take 1 capsule (40 mg total) by mouth daily. Daryl Setter, NP  Active Self  ondansetron  (ZOFRAN ) 4 MG tablet [id number]  Take 1 tablet (4 mg total) by mouth every 8 (eight) hours as needed for nausea or vomiting. Daryl Setter, NP  Active Self  potassium chloride  (KLOR-CON ) 10 MEQ tablet [id number]  Take 1  tablet (10 mEq total) by mouth daily. Paz, Jose E, MD  Active Self  predniSONE  (DELTASONE ) 20 MG tablet [id number]  Take 1 tablet (20 mg total) by mouth daily with breakfast. Neda Hammond A, MD  Active Self  prochlorperazine  (COMPAZINE ) 10 MG tablet [id number]  Take 10 mg by mouth every 6 (six) hours as needed. [provider]  Active Self  rivaroxaban  (XARELTO ) 20 MG TABS tablet [id number]  TAKE 1 TABLET BY MOUTH EVERY DAY Daryl Setter, NP  Active Self  senna (SENNA LAXATIVE) 8.6 MG tablet [id number]  Take 1-4 tablets (8.6-34.4 mg total) by mouth 2 (two) times daily as needed for constipation   Active Self  sertraline  (ZOLOFT ) 50 MG tablet [id number]  Take 1 tablet (50 mg total) by mouth daily. Daryl Setter, NP  Active Self  spironolactone  (ALDACTONE ) 50 MG tablet [id number]  Take 1 tablet (50 mg total) by mouth daily. Paz, Jose E, MD  Active Self  tamsulosin  (FLOMAX ) 0.4 MG CAPS capsule [id number]  Take 1 capsule (0.4 mg total) by mouth at bedtime. Daryl Setter, NP  Active Self  traMADol  (ULTRAM ) 50 MG tablet [id number]  Take 1 tablet (50 mg total) by mouth every 12 (twelve) hours as  needed. Daryl Setter, NP  Active Self            Recommendation:   Continue Current Plan of Care  Follow Up Plan:   Telephone follow-up 06/01/2024 at 11:30 am  Olam Ally, MSW, LCSW [place]  [facility], [facility] Licensed Clinical Social Worker Direct Dial: [phone]

## 2024-05-28 ENCOUNTER — Encounter (HOSPITAL_COMMUNITY): Payer: Self-pay | Admitting: Physician Assistant

## 2024-05-28 ENCOUNTER — Ambulatory Visit (HOSPITAL_COMMUNITY): Admission: RE | Admit: 2024-05-28 | Source: Home / Self Care | Admitting: Pain Medicine

## 2024-05-28 SURGERY — DECOMPRESSION, SPINE LUMBAR, MINIMALLY INVASIVE
Anesthesia: General

## 2024-06-01 ENCOUNTER — Other Ambulatory Visit: Payer: Self-pay

## 2024-06-01 DIAGNOSIS — J441 Chronic obstructive pulmonary disease with (acute) exacerbation: Secondary | ICD-10-CM

## 2024-06-01 NOTE — Patient Instructions (Signed)
 Visit Information  Thank you for taking time to visit with me today. Please don't hesitate to contact me if I can be of assistance to you before our next scheduled appointment.  Your next care management appointment is by telephone on 06/15/2024 at 10:00 am.   Please call the care guide team at [phone] if you need to cancel, schedule, or reschedule an appointment.   Please call 911 if you are experiencing a Mental Health or Behavioral Health Crisis or need someone to talk to.  Olam Ally, MSW, LCSW [place]  [facility], [facility] Licensed Clinical Social Worker Direct Dial: [phone]

## 2024-06-01 NOTE — Patient Outreach (Addendum)
 Complex Care Management   Visit Note  06/01/2024  Name:  Robert Bates MRN: [id number] DOB: 10-12-1941  Situation: Referral received for Complex Care Management related to Mental/Behavioral Health diagnosis mild depression and SDOH Barriers:  Housing . I obtained verbal consent from Patient.  Visit completed with patient  on the phone. LCSW spoke with patient and was provide more information in regards to the type of housing patient is wanting to secure. LCSW scheduled follow up appointment with BSW for 06/04/2024.  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   COPD (chronic obstructive pulmonary disease) (HCC)    Coronary artery disease    COVID-19 virus infection 04/23/2021   Diabetes type 2, controlled (HCC) 07/31/2017   Diverticulitis 07/29/2023   Dyspnea    Dysrhythmia    a fib   GERD (gastroesophageal reflux disease)    Hematuria    refuses work up or referral - understands risks of morbidity / mortality - 11/2008, 12/2008   Heme positive stool    History of hiatal hernia    History of kidney stones    Hyperlipemia    Meningioma (HCC) 10/25/2013   Follows with Dr. Rockey [place].    Peripheral vascular disease (HCC)    Abdominal Aortic Aneursym   Pneumonia    as a child   Radiation 09/18/15-10/25/15   left lower lobe 70.2 Gy   Seizures (HCC) 02/18/2020   Tobacco abuse     Assessment: Patient Reported Symptoms:  Cognitive Cognitive Status: Alert and oriented to person, place, and time, Normal speech and language skills Cognitive/Intellectual Conditions Management [RPT]: None reported or documented in medical history or problem list      Neurological Neurological Review of Symptoms: Not assessed     HEENT HEENT Symptoms Reported: Not assessed      Cardiovascular Cardiovascular Symptoms Reported: Not assessed    Respiratory Respiratory Symptoms Reported: Not assesed    Endocrine Endocrine Symptoms Reported: No symptoms reported Is patient diabetic?: Yes    Gastrointestinal Gastrointestinal Symptoms Reported: Not assessed      Genitourinary Genitourinary Symptoms Reported: Not assessed    Integumentary Integumentary Symptoms Reported: Not assessed    Musculoskeletal Musculoskelatal Symptoms Reviewed: Not assessed        Psychosocial Psychosocial Symptoms Reported: No symptoms reported Additional Psychological Details: Patient reports anxiety is stable Behavioral Management Strategies: Adequate rest, Medication therapy          05/27/2024   11:46 AM  Depression screen PHQ 2/9  Decreased Interest 0  Down, Depressed, Hopeless 0  PHQ - 2 Score 0    There were no vitals filed for this visit.  Medications Reviewed Today     Reviewed by Sherren Olam FORBES KEN (Social Worker) on 06/01/24 at 1136  Med List Status: <None>   Medication Order Taking? Sig Documenting Provider Last Dose Status Informant  acetaminophen  (TYLENOL ) 500 MG tablet [id number]  Take 1-2 tablets (500-1,000 mg total) by mouth every 6 (six) hours as needed. Dwan Kyla HERO, PA-C  Active Self           Med Note JUSTINO, TAMMY B   Wed Jul 16, 2023 11:25 AM)    amiodarone  (PACERONE ) 200 MG tablet  [id number]  Take 1 tablet (200 mg total) by mouth daily. Nahser, Aleene PARAS, MD  Active Self  atorvastatin  (LIPITOR ) 80 MG tablet [id number]  Take 1 tablet (80 mg total) by mouth daily. Daryl Setter, NP  Active Self  budesonide -glycopyrrolate -formoterol  (BREZTRI  AEROSPHERE) 160-9-4.8 MCG/ACT AERO inhaler [id number]  Inhale 2 puffs into the lungs 2 (two) times daily. Daryl Setter, NP  Active Self  diltiazem  (CARDIZEM  CD) 120 MG 24 hr capsule [id number]  Take 1 capsule (120 mg total) by mouth daily.  Daryl Setter, NP  Active Self  furosemide  (LASIX ) 40 MG tablet [id number]  Take 1 tablet (40 mg total) by mouth daily. Paz, Jose E, MD  Active Self  glipiZIDE  (GLUCOTROL  XL) 2.5 MG 24 hr tablet [id number]  Take 1 tablet (2.5 mg total) by mouth daily with breakfast. Daryl Setter, NP  Active Self  HYDROcodone -acetaminophen  (NORCO/VICODIN) 5-325 MG tablet [id number]  Take 1 tablet by mouth every 6 (six) hours as needed for pain.   Active   levalbuterol  (XOPENEX ) 0.63 MG/3ML nebulizer solution [id number]  Take 3 mLs (0.63 mg total) by nebulization every 4 (four) hours as needed for wheezing or shortness of breath. Daryl Setter, NP  Active Self  omeprazole  (PRILOSEC) 40 MG capsule [id number]  Take 1 capsule (40 mg total) by mouth daily. Daryl Setter, NP  Active Self  ondansetron  (ZOFRAN ) 4 MG tablet [id number]  Take 1 tablet (4 mg total) by mouth every 8 (eight) hours as needed for nausea or vomiting. Daryl Setter, NP  Active Self  potassium chloride  (KLOR-CON ) 10 MEQ tablet [id number]  Take 1 tablet (10 mEq total) by mouth daily. Paz, Jose E, MD  Active Self  predniSONE  (DELTASONE ) 20 MG tablet [id number]  Take 1 tablet (20 mg total) by mouth daily with breakfast. Neda Hammond A, MD  Active Self  prochlorperazine  (COMPAZINE ) 10 MG tablet [id number]  Take 10 mg by mouth every 6 (six) hours as needed. [provider]  Active Self  rivaroxaban  (XARELTO ) 20 MG TABS tablet [id number]  TAKE 1 TABLET BY MOUTH EVERY DAY Daryl Setter, NP  Active Self  senna (SENNA LAXATIVE) 8.6 MG tablet [id number]  Take 1-4 tablets (8.6-34.4 mg total) by mouth 2 (two) times daily as needed for constipation   Active Self  sertraline  (ZOLOFT ) 50 MG tablet [id number]  Take 1 tablet (50 mg total) by mouth daily. Daryl Setter, NP  Active Self  spironolactone  (ALDACTONE ) 50 MG tablet [id number]  Take 1 tablet (50 mg total) by mouth daily. Paz, Jose E, MD  Active Self  tamsulosin  (FLOMAX )  0.4 MG CAPS capsule [id number]  Take 1 capsule (0.4 mg total) by mouth at bedtime. Daryl Setter, NP  Active Self  traMADol  (ULTRAM ) 50 MG tablet [id number]  Take 1 tablet (50 mg total) by mouth every 12 (twelve) hours as needed. Daryl Setter, NP  Active Self            Recommendation:   Continue Current Plan of Care  Follow Up Plan:   Telephone follow-up with LCSW on 06/15/2024 at 10:00 am and 11:30 am with BSW on 06/04/2024.  Olam Ally, MSW, LCSW [place]  [facility], [facility] Licensed Clinical Social Worker Direct Dial: [phone]

## 2024-06-04 ENCOUNTER — Other Ambulatory Visit: Payer: Self-pay | Admitting: Licensed Clinical Social Worker

## 2024-06-04 NOTE — Patient Outreach (Signed)
 Complex Care Management   Visit Note  06/04/2024  Name:  Robert Bates MRN: [id number] DOB: 1941/10/20  Situation: Referral received for Complex Care Management related to SDOH Barriers:  Housing and hosuing resources  I obtained verbal consent from Patient.  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   COPD (chronic obstructive pulmonary disease) (HCC)    Coronary artery disease    COVID-19 virus infection 04/23/2021   Diabetes type 2, controlled (HCC) 07/31/2017   Diverticulitis 07/29/2023   Dyspnea    Dysrhythmia    a fib   GERD (gastroesophageal reflux disease)    Hematuria    refuses work up or referral - understands risks of morbidity / mortality - 11/2008, 12/2008   Heme positive stool    History of hiatal hernia    History of kidney stones    Hyperlipemia    Meningioma (HCC) 10/25/2013   Follows with Dr. Rockey [place].    Peripheral vascular disease (HCC)    Abdominal Aortic Aneursym   Pneumonia    as a child   Radiation 09/18/15-10/25/15   left lower lobe 70.2 Gy   Seizures (HCC) 02/18/2020   Tobacco abuse     Assessment:Follow up on housing resources and patient has applied for housing for [place] and just applied about 1 month ago and is waiting for approval .    Recommendation:   none  Follow Up Plan:   Telephone follow up appointment date/time:  06/18/2024 at 10:00 am  Tobias [name] [name], PhD [facility], [facility] Social Worker Direct Dial: [phone]  Fax: [phone]

## 2024-06-04 NOTE — Patient Instructions (Signed)
 Visit Information  Thank you for taking time to visit with me today. Please don't hesitate to contact me if I can be of assistance to you before our next scheduled appointment.  Our next appointment is by telephone on 06/18/2024 at 10:00 am Please call the care guide team at [phone] if you need to cancel or reschedule your appointment.   Following is a copy of your care plan:   Goals Addressed             This Visit's Progress    BSW VBC[facility] Social Work Care Plan       Problems:   Housing   CSW Clinical Goal(s):   Over the next 2 weeks the Patient will will follow up with resources on housing that will be mailed  as directed by Social Work.  Interventions:  SW will mail resources for [name] and [facility] for housing   Patient Goals/Self-Care Activities:  Follow up on housing resources and patient has applied for housing for [place] and just applied about 1 month ago and is waiting for approval .  Plan:   Telephone follow up appointment with care management team member scheduled for:  06/18/2024 at 10:00 am        Please call the Suicide and Crisis Lifeline: 988 go to [facility] [address], [place] [phone]) call 911 if you are experiencing a Mental Health or Behavioral Health Crisis or need someone to talk to.  Patient verbalizes understanding of instructions and care plan provided today and agrees to view in MyChart. Active MyChart status and patient understanding of how to access instructions and care plan via MyChart confirmed with patient.     Robert [name] [name], PhD [facility], [facility] Social Worker Direct Dial: [phone]  Fax: [phone]

## 2024-06-10 ENCOUNTER — Other Ambulatory Visit (HOSPITAL_BASED_OUTPATIENT_CLINIC_OR_DEPARTMENT_OTHER): Payer: Self-pay

## 2024-06-10 ENCOUNTER — Other Ambulatory Visit: Payer: Self-pay | Admitting: Internal Medicine

## 2024-06-10 ENCOUNTER — Other Ambulatory Visit: Payer: Self-pay | Admitting: Family

## 2024-06-10 DIAGNOSIS — F419 Anxiety disorder, unspecified: Secondary | ICD-10-CM

## 2024-06-11 ENCOUNTER — Other Ambulatory Visit (HOSPITAL_BASED_OUTPATIENT_CLINIC_OR_DEPARTMENT_OTHER): Payer: Self-pay

## 2024-06-11 MED ORDER — TAMSULOSIN HCL 0.4 MG PO CAPS
0.4000 mg | ORAL_CAPSULE | Freq: Every day | ORAL | 1 refills | Status: AC
  Filled 2024-06-11: qty 90, 90d supply, fill #0
  Filled 2024-09-27: qty 90, 90d supply, fill #1

## 2024-06-15 ENCOUNTER — Other Ambulatory Visit: Payer: Self-pay | Admitting: Family Medicine

## 2024-06-15 ENCOUNTER — Other Ambulatory Visit: Payer: Self-pay | Admitting: Internal Medicine

## 2024-06-15 ENCOUNTER — Other Ambulatory Visit: Payer: Self-pay

## 2024-06-15 ENCOUNTER — Other Ambulatory Visit (HOSPITAL_BASED_OUTPATIENT_CLINIC_OR_DEPARTMENT_OTHER): Payer: Self-pay

## 2024-06-15 ENCOUNTER — Other Ambulatory Visit: Payer: Self-pay | Admitting: Family

## 2024-06-15 ENCOUNTER — Other Ambulatory Visit: Payer: Self-pay | Admitting: Pulmonary Disease

## 2024-06-15 DIAGNOSIS — E119 Type 2 diabetes mellitus without complications: Secondary | ICD-10-CM

## 2024-06-15 DIAGNOSIS — J441 Chronic obstructive pulmonary disease with (acute) exacerbation: Secondary | ICD-10-CM

## 2024-06-15 DIAGNOSIS — F419 Anxiety disorder, unspecified: Secondary | ICD-10-CM

## 2024-06-15 MED ORDER — PREDNISONE 20 MG PO TABS
20.0000 mg | ORAL_TABLET | Freq: Every day | ORAL | 0 refills | Status: DC
  Filled 2024-06-15: qty 7, 7d supply, fill #0

## 2024-06-15 NOTE — Patient Instructions (Signed)
 Visit Information  Benancio Osmundson - I am sorry I was unable to reach you today for our scheduled appointment. I work with Daryl Setter, NP and am calling to support your healthcare needs. Please contact me at [phone] at your earliest convenience. I look forward to speaking with you soon.    Your next care management appointment is by telephone on 06/29/2024 at 10:00am   Please call the care guide team at [phone] if you need to cancel, schedule, or reschedule an appointment.   Please call 911 if you are experiencing a Mental Health or Behavioral Health Crisis or need someone to talk to. Olam Ally, MSW, LCSW [place]  [facility], [facility] Licensed Clinical Social Worker Direct Dial: [phone]

## 2024-06-15 NOTE — Patient Outreach (Signed)
 LCSW called patient at scheduled time and was unable to reach him. LCSW rescheduled appointment for 06/29/2024 at 10:00am.  Olam Ally, MSW, LCSW [place]  [facility], [facility] Licensed Clinical Social Worker Direct Dial: [phone]

## 2024-06-16 ENCOUNTER — Other Ambulatory Visit (HOSPITAL_BASED_OUTPATIENT_CLINIC_OR_DEPARTMENT_OTHER): Payer: Self-pay

## 2024-06-16 ENCOUNTER — Encounter (HOSPITAL_BASED_OUTPATIENT_CLINIC_OR_DEPARTMENT_OTHER): Payer: Self-pay

## 2024-06-16 MED ORDER — GLIPIZIDE ER 2.5 MG PO TB24
2.5000 mg | ORAL_TABLET | Freq: Every day | ORAL | 0 refills | Status: DC
  Filled 2024-06-16 – 2024-07-15 (×2): qty 90, 90d supply, fill #0

## 2024-06-16 MED ORDER — ONDANSETRON HCL 4 MG PO TABS
4.0000 mg | ORAL_TABLET | Freq: Three times a day (TID) | ORAL | 1 refills | Status: DC | PRN
  Filled 2024-06-16 (×2): qty 30, 10d supply, fill #0

## 2024-06-16 MED ORDER — BENZONATATE 100 MG PO CAPS
100.0000 mg | ORAL_CAPSULE | Freq: Three times a day (TID) | ORAL | 0 refills | Status: DC | PRN
  Filled 2024-06-16 (×2): qty 30, 10d supply, fill #0

## 2024-06-18 ENCOUNTER — Other Ambulatory Visit: Payer: Self-pay | Admitting: Licensed Clinical Social Worker

## 2024-06-18 ENCOUNTER — Ambulatory Visit: Payer: Self-pay | Admitting: Family

## 2024-06-18 ENCOUNTER — Ambulatory Visit: Admitting: Family

## 2024-06-18 ENCOUNTER — Encounter: Payer: Self-pay | Admitting: Family

## 2024-06-18 ENCOUNTER — Other Ambulatory Visit (HOSPITAL_BASED_OUTPATIENT_CLINIC_OR_DEPARTMENT_OTHER): Payer: Self-pay

## 2024-06-18 ENCOUNTER — Ambulatory Visit (HOSPITAL_BASED_OUTPATIENT_CLINIC_OR_DEPARTMENT_OTHER)
Admission: RE | Admit: 2024-06-18 | Discharge: 2024-06-18 | Disposition: A | Discharge status: Home / Self Care | Source: Ambulatory Visit | Attending: Family | Admitting: Family

## 2024-06-18 VITALS — BP 107/55 | HR 64 | Temp 97.9°F | Resp 16 | Ht 69.0 in | Wt 178.0 lb

## 2024-06-18 DIAGNOSIS — R0602 Shortness of breath: Secondary | ICD-10-CM | POA: Diagnosis not present

## 2024-06-18 DIAGNOSIS — J441 Chronic obstructive pulmonary disease with (acute) exacerbation: Secondary | ICD-10-CM | POA: Insufficient documentation

## 2024-06-18 DIAGNOSIS — Z85118 Personal history of other malignant neoplasm of bronchus and lung: Secondary | ICD-10-CM

## 2024-06-18 DIAGNOSIS — R569 Unspecified convulsions: Secondary | ICD-10-CM

## 2024-06-18 DIAGNOSIS — M549 Dorsalgia, unspecified: Secondary | ICD-10-CM | POA: Diagnosis not present

## 2024-06-18 DIAGNOSIS — G8929 Other chronic pain: Secondary | ICD-10-CM

## 2024-06-18 DIAGNOSIS — I5032 Chronic diastolic (congestive) heart failure: Secondary | ICD-10-CM | POA: Diagnosis not present

## 2024-06-18 DIAGNOSIS — E119 Type 2 diabetes mellitus without complications: Secondary | ICD-10-CM

## 2024-06-18 DIAGNOSIS — I10 Essential (primary) hypertension: Secondary | ICD-10-CM | POA: Diagnosis not present

## 2024-06-18 DIAGNOSIS — Z951 Presence of aortocoronary bypass graft: Secondary | ICD-10-CM | POA: Diagnosis not present

## 2024-06-18 DIAGNOSIS — I4819 Other persistent atrial fibrillation: Secondary | ICD-10-CM | POA: Diagnosis not present

## 2024-06-18 DIAGNOSIS — R918 Other nonspecific abnormal finding of lung field: Secondary | ICD-10-CM | POA: Diagnosis not present

## 2024-06-18 DIAGNOSIS — J90 Pleural effusion, not elsewhere classified: Secondary | ICD-10-CM | POA: Diagnosis not present

## 2024-06-18 MED ORDER — POTASSIUM CHLORIDE ER 10 MEQ PO TBCR
10.0000 meq | EXTENDED_RELEASE_TABLET | Freq: Every day | ORAL | 0 refills | Status: DC
  Filled 2024-06-18: qty 90, 90d supply, fill #0

## 2024-06-18 MED ORDER — TRAMADOL HCL 50 MG PO TABS
50.0000 mg | ORAL_TABLET | Freq: Two times a day (BID) | ORAL | 0 refills | Status: DC | PRN
  Filled 2024-06-18: qty 60, 30d supply, fill #0

## 2024-06-18 NOTE — Assessment & Plan Note (Signed)
No recent seizures

## 2024-06-18 NOTE — Assessment & Plan Note (Deleted)
 Continues Breztri  and Xopenex .

## 2024-06-18 NOTE — Assessment & Plan Note (Signed)
 BP stable today. Continues diltiazem .

## 2024-06-18 NOTE — Assessment & Plan Note (Signed)
 Continues amiodarone , diltiazem  and xarelto .

## 2024-06-18 NOTE — Progress Notes (Signed)
 Subjective:     Patient ID: Robert Bates, male    DOB: [birth year], 83 y.o.   MRN: [id number]  Chief Complaint  Patient presents with   Chronic back pain    Here for follow up   Anxiety    Here for follow up    Anxiety      Discussed the use of AI scribe software for clinical note transcription with the patient, who gave verbal consent to proceed.  History of Present Illness  Robert Bates is an 83 year old male with chronic pain and breathing difficulties who presents with worsening symptoms.  He experiences significant pain and difficulty breathing since this morning, unrelieved by pain medication. He has a limited supply of pain medication for the week and prefers to fill prescriptions at the med center pharmacy.  He has had recent episodes of nausea and vomiting, sometimes associated with his pain medication, despite taking an anti-nausea pill.  He takes Lasix  40 mg daily for fluid management, resulting in slight weight loss and reduced leg swelling. Missing doses leads to increased swelling. He also takes Xarelto  for atrial fibrillation and amiodarone .  He is in contact with a Child psychotherapist regarding his living situation and seeks a more permanent arrangement.  He is now staying with his niece.      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 Rockey [place].    Peripheral vascular disease (HCC)    Abdominal Aortic Aneursym   Pneumonia    as a child   Radiation 09/18/15-10/25/15   left lower lobe 70.2 Gy   Seizures (HCC) 02/18/2020   Tobacco abuse     Past Surgical History:  Procedure Laterality Date   CHOLECYSTECTOMY N/A 07/23/2017   Procedure: LAPAROSCOPIC CHOLECYSTECTOMY;  Surgeon: Stevie Herlene Righter, MD;  Location: [facility];  Service: General;  Laterality: N/A;   CLIPPING OF ATRIAL APPENDAGE Left 05/08/2021   Procedure: CLIPPING OF ATRIAL APPENDAGE USING 45 ATRICLIP;  Surgeon: German Bartlett PEDLAR, MD;  Location: [facility];  Service: Open Heart Surgery;  Laterality: Left;   COLONOSCOPY     CORONARY ARTERY BYPASS GRAFT N/A 05/08/2021   Procedure: CORONARY  ARTERY BYPASS GRAFTING (CABG)X 3 USING LEFT INTERNAL MAMMARY ARTERY AND RIGHT GREATER SAPEHNOUS VEIN;  Surgeon: German Bartlett PEDLAR, MD;  Location: [facility];  Service: Open Heart Surgery;  Laterality: N/A;   ENDOVEIN HARVEST OF GREATER SAPHENOUS VEIN Right 05/08/2021   Procedure: ENDOVEIN HARVEST OF GREATER SAPHENOUS VEIN;  Surgeon: German Bartlett PEDLAR, MD;  Location[facility];  Service: Open Heart Surgery;  Laterality: Right;   EYE SURGERY Bilateral    Cataracts removed w/ lens implant   HERNIA REPAIR     Left 36 years ago . Right inguinal hernia repair 10-01-17 Dr. Stevie   INGUINAL HERNIA REPAIR Right 10/01/2017   Procedure: RIGHT INGUINAL  HERNIA REPAIR WITH MESH;  Surgeon: Kinsinger, Herlene Righter, MD;  Location: [facility];  Service: General;  Laterality: Right;  TAP BLOCK   INSERTION OF MESH Right 10/01/2017   Procedure: INSERTION OF MESH;  Surgeon: Stevie Herlene Righter, MD;  Location: [facility];  Service: General;  Laterality: Right;   IR THORACENTESIS ASP PLEURAL SPACE W/IMG GUIDE  05/18/2021   IR THORACENTESIS ASP PLEURAL SPACE W/IMG GUIDE  06/07/2021   LEFT HEART CATH AND CORONARY ANGIOGRAPHY N/A 04/20/2021   Procedure: LEFT HEART CATH AND CORONARY ANGIOGRAPHY;  Surgeon: Darron Deatrice LABOR, MD;  [facility];  Service: Cardiovascular;  Laterality: N/A;   TEE WITHOUT CARDIOVERSION N/A 05/08/2021   Procedure: TRANSESOPHAGEAL ECHOCARDIOGRAM (TEE);  Surgeon: German Bartlett PEDLAR, MD;  Location: [facility] OR;  Service: Open Heart Surgery;  Laterality: N/A;   TONSILLECTOMY     TONSILLECTOMY     VIDEO BRONCHOSCOPY Bilateral 07/26/2015   Procedure: VIDEO BRONCHOSCOPY WITH FLUORO;  Surgeon: Ozell KATHEE America, MD;  [facility];  Service: Cardiopulmonary;  Laterality: Bilateral;   VIDEO BRONCHOSCOPY WITH ENDOBRONCHIAL NAVIGATION N/A 08/23/2015   Procedure: VIDEO BRONCHOSCOPY WITH ENDOBRONCHIAL NAVIGATION;  Surgeon: Dallas KATHEE Jude, MD;  [facility];  Service: Thoracic;  Laterality: N/A;   VIDEO BRONCHOSCOPY WITH ENDOBRONCHIAL ULTRASOUND N/A 08/23/2015   Procedure: VIDEO BRONCHOSCOPY WITH ENDOBRONCHIAL ULTRASOUND;  Surgeon: Dallas KATHEE Jude, MD;  [facility];  Service: Thoracic;  Laterality: N/A;    Family History  Problem Relation Age of Onset   Leukemia Father    Emphysema Father    Learning disabilities Son    Atrial fibrillation Son    Leukemia Other    Stroke Other     Social History   Socioeconomic History   Marital status: Widowed    Spouse name: Not on file   Number of children: 2   Years of education: Not on file   Highest education level: Not on file  Occupational History   Occupation: Retired     Associate Professor: [employer]    Comment: truck Air traffic controller: [employer]  Tobacco Use   Smoking status: Former    Current packs/day: 0.00    Average packs/day: 1 pack/day for 57.0 years (57.0 ttl pk-yrs)    Types: Cigarettes, Cigars    Start date: 08/07/1958    Quit date: 08/08/2015    Years since quitting: 8.8   Smokeless tobacco: Former    Types: Chew    Quit date: 11/04/1958   Tobacco comments:    Will smoke cigar every once in awhile.  Vaping daily started a couple weeks ago.  04/18/22 hfb    Patient states he is still vaping. AB, CMA 09-10-2023  Vaping Use   Vaping status: Some Days  Substance and Sexual Activity   Alcohol use: Not Currently  Alcohol/week: 0.0 standard drinks of alcohol   Drug use: No   Sexual activity: Not Currently  Other Topics Concern   Not on file  Social History Narrative   Not on file   Social Drivers of Health   Financial Resource Strain: Medium Risk (06/18/2024)   Overall Financial Resource Strain (CARDIA)    Difficulty of Paying Living Expenses: Somewhat hard  Food Insecurity: No Food Insecurity (06/18/2024)   Hunger Vital Sign    Worried About Running Out of Food in the Last Year: Never true    Ran Out of Food in the Last Year: Never true  Transportation Needs: No Transportation Needs (06/18/2024)   PRAPARE - Administrator, Civil Service (Medical): No    Lack of Transportation (Non-Medical): No  Physical Activity: Insufficiently Active (08/05/2021)   Exercise Vital Sign    Days of Exercise per Week: 2 days    Minutes of Exercise per Session: 40 min  Stress: Stress Concern Present (05/22/2023)   [employer] of Occupational Health - Occupational Stress Questionnaire    Feeling of Stress : Rather much  Social Connections: Moderately Isolated (05/13/2022)   Social Connection and Isolation Panel    Frequency of Communication with Friends and Family: Twice a week    Frequency of Social Gatherings with Friends and Family: Once a  week    Attends Religious Services: Never    Database administrator or Organizations: No    Attends Banker Meetings: Never    Marital Status: Married  Catering manager Violence: Not At Risk (06/18/2024)   Humiliation, Afraid, Rape, and Kick questionnaire    Fear of Current or Ex-Partner: No    Emotionally Abused: No    Physically Abused: No    Sexually Abused: No    Outpatient Medications Prior to Visit  Medication Sig Dispense Refill   acetaminophen  (TYLENOL ) 500 MG tablet Take 1-2 tablets (500-1,000 mg total) by mouth every 6 (six) hours as needed. 30 tablet 0   amiodarone  (PACERONE ) 200 MG tablet Take 1 tablet (200 mg total) by mouth daily. 90 tablet 3   atorvastatin  (LIPITOR ) 80 MG tablet Take 1 tablet (80 mg total) by mouth daily. 90 tablet 1   benzonatate  (TESSALON ) 100 MG capsule Take 1 capsule (100 mg total) by mouth 3 (three) times daily as needed. 30 capsule 0   budesonide -glycopyrrolate -formoterol  (BREZTRI  AEROSPHERE) 160-9-4.8 MCG/ACT AERO inhaler Inhale 2 puffs into the lungs 2 (two) times daily.     diltiazem  (CARDIZEM  CD) 120 MG 24 hr capsule Take 1 capsule (120 mg total) by mouth daily. 90 capsule 1   furosemide  (LASIX ) 40 MG tablet Take 1 tablet (40 mg total) by mouth daily. 90 tablet 0   glipiZIDE  (GLUCOTROL  XL) 2.5 MG 24 hr tablet Take 1 tablet (2.5 mg total) by mouth daily with breakfast. 90 tablet 0   levalbuterol  (XOPENEX ) 0.63 MG/3ML nebulizer solution Take 3 mLs (0.63 mg total) by nebulization every 4 (four) hours as needed for wheezing or shortness of breath. 75 mL 12   omeprazole  (PRILOSEC) 40 MG capsule Take 1 capsule (40 mg total) by mouth daily. 90 capsule 1   ondansetron  (ZOFRAN ) 4 MG tablet Take 1 tablet (4 mg total) by mouth every 8 (eight) hours as needed for nausea or vomiting. 30 tablet 1   potassium chloride  (KLOR-CON ) 10 MEQ tablet Take 1 tablet (10 mEq total) by mouth daily. 90 tablet 0   predniSONE  (DELTASONE ) 20 MG tablet Take 1 tablet  (  20 mg total) by mouth daily with breakfast. 7 tablet 0   prochlorperazine  (COMPAZINE ) 10 MG tablet Take 10 mg by mouth every 6 (six) hours as needed.     rivaroxaban  (XARELTO ) 20 MG TABS tablet TAKE 1 TABLET BY MOUTH EVERY DAY 90 tablet 1   senna (SENNA LAXATIVE) 8.6 MG tablet Take 1-4 tablets (8.6-34.4 mg total) by mouth 2 (two) times daily as needed for constipation 100 tablet 0   sertraline  (ZOLOFT ) 50 MG tablet Take 1 tablet (50 mg total) by mouth daily. 30 tablet 3   spironolactone  (ALDACTONE ) 50 MG tablet Take 1 tablet (50 mg total) by mouth daily. 90 tablet 0   tamsulosin  (FLOMAX ) 0.4 MG CAPS capsule Take 1 capsule (0.4 mg total) by mouth at bedtime. 90 capsule 1   HYDROcodone -acetaminophen  (NORCO/VICODIN) 5-325 MG tablet Take 1 tablet by mouth every 6 (six) hours as needed for pain. 20 tablet 0   traMADol  (ULTRAM ) 50 MG tablet Take 1 tablet (50 mg total) by mouth every 12 (twelve) hours as needed. 60 tablet 0   No facility-administered medications prior to visit.    Allergies  Allergen Reactions   Iodine Swelling    Neck, gland swelling   Iohexol  Swelling    Neck, gland swelling   Metformin  And Related Diarrhea   Metformin  Nausea Only    ROS See HPI    Objective:    Physical Exam Constitutional:      General: He is not in acute distress.    Appearance: He is well-developed.  HENT:     Head: Normocephalic and atraumatic.  Cardiovascular:     Rate and Rhythm: Normal rate and regular rhythm.     Heart sounds: No murmur heard. Pulmonary:     Effort: Pulmonary effort is normal. No respiratory distress.     Breath sounds: Examination of the right-upper field reveals wheezing. Examination of the left-upper field reveals wheezing. Examination of the right-middle field reveals wheezing. Examination of the left-middle field reveals wheezing. Examination of the left-lower field reveals decreased breath sounds. Decreased breath sounds and wheezing present. No rales.  Skin:     General: Skin is warm and dry.  Neurological:     Mental Status: He is alert and oriented to person, place, and time.  Psychiatric:        Behavior: Behavior normal.        Thought Content: Thought content normal.      BP (!) 107/55 (BP Location: Right Arm, Patient Position: Sitting, Cuff Size: Normal)   Pulse 64   Temp 97.9 F (36.6 C) (Oral)   Resp 16   Ht 5' 9 (1.753 m)   Wt 178 lb (80.7 kg)   SpO2 96%   BMI 26.29 kg/m  Wt Readings from Last 3 Encounters:  06/18/24 178 lb (80.7 kg)  05/21/24 179 lb (81.2 kg)  05/19/24 180 lb (81.6 kg)       Assessment & Plan:   Problem List Items Addressed This Visit       Unprioritized   Seizures (HCC) - Primary   No recent seizures.      Primary hypertension   BP stable today. Continues diltiazem .        Relevant Orders   Basic Metabolic Panel (BMET)   Persistent atrial fibrillation (HCC)   Continues amiodarone , diltiazem  and xarelto .       History of lung cancer   S/p curative radiation therapy in 2016.       Diabetes type 2,  controlled (HCC)   Lab Results  Component Value Date   HGBA1C 6.2 01/28/2024   HGBA1C 7.2 (H) 09/16/2023   HGBA1C 6.7 (H) 04/03/2023   Lab Results  Component Value Date   LDLCALC 47 08/07/2023   CREATININE 1.05 08/14/2023   Due for follow up A1C.  Maintained on glipizide .       Relevant Orders   Urine microalbumin-creatinine with uACR   HgB A1c   Ambulatory referral to Ophthalmology   COPD exacerbation (HCC)   CXR today. Continue prednisone . Xopenex , Breztri .        Relevant Orders   DG Chest 2 View (Completed)   Chronic heart failure with preserved ejection fraction (HFpEF) (HCC)   Swelling is improved in legs and weight down several pounds. Continue furosemide  40 mg daily.  I suspect ongoing LLL pleural effusion- will check cxr to make sure it has not enlarged given his c/o SOB.      Chronic back pain   Fair pain control with prn tramadol . Continue same, has follow up  with neurosurgery schedule.      Relevant Medications   traMADol  (ULTRAM ) 50 MG tablet    I have discontinued Jeven Liotta's HYDROcodone -acetaminophen . I am also having him maintain his acetaminophen , senna, amiodarone , prochlorperazine , diltiazem , omeprazole , furosemide , potassium chloride , spironolactone , levalbuterol , Breztri  Aerosphere, Xarelto , atorvastatin , sertraline , tamsulosin , predniSONE , benzonatate , glipiZIDE , ondansetron , and traMADol .  Meds ordered this encounter  Medications   traMADol  (ULTRAM ) 50 MG tablet    Sig: Take 1 tablet (50 mg total) by mouth every 12 (twelve) hours as needed.    Dispense:  60 tablet    Refill:  0    Supervising Provider:   DOMENICA BLACKBIRD A [[id number]]

## 2024-06-18 NOTE — Assessment & Plan Note (Signed)
 Lab Results  Component Value Date   HGBA1C 6.2 01/28/2024   HGBA1C 7.2 (H) 09/16/2023   HGBA1C 6.7 (H) 04/03/2023   Lab Results  Component Value Date   LDLCALC 47 08/07/2023   CREATININE 1.05 08/14/2023   Due for follow up A1C.  Maintained on glipizide .

## 2024-06-18 NOTE — Assessment & Plan Note (Signed)
 Fair pain control with prn tramadol . Continue same, has follow up with neurosurgery schedule.

## 2024-06-18 NOTE — Assessment & Plan Note (Addendum)
 Swelling is improved in legs and weight down several pounds. Continue furosemide  40 mg daily.  I suspect ongoing LLL pleural effusion- will check cxr to make sure it has not enlarged given his c/o SOB.

## 2024-06-18 NOTE — Patient Instructions (Signed)
 VISIT SUMMARY:  Today, we addressed your chronic pain, breathing difficulties, and other ongoing health issues. We discussed your medications, potential side effects, and the importance of continuing your current treatments. We also talked about your living situation and the need for consistent medical care.  YOUR PLAN:  HEART FAILURE WITH LOWER EXTREMITY EDEMA: You have chronic heart failure with improved leg swelling and reduced fluid retention due to your diuretic medication. -Continue taking furosemide  (Lasix ) 40 mg once daily.  ATRIAL FIBRILLATION: You have chronic atrial fibrillation, which is being managed with blood thinners and medication to control your heart rhythm. -Continue taking Xarelto  for anticoagulation. -Continue taking amiodarone  for rhythm control.  CHRONIC PAIN: You are experiencing chronic pain with severe episodes that affect your breathing. -A refill for your pain medication has been sent to the pharmacy.  NAUSEA AND VOMITING: You have intermittent nausea and vomiting, likely related to your pain medication. -Continue taking your anti-nausea medication as prescribed.  POTENTIAL RELOCATION: [place] need for local medical management due to your complex health needs. -[place], it is recommended to find a local healthcare provider.

## 2024-06-18 NOTE — Patient Instructions (Signed)
 Visit Information  Thank you for taking time to visit with me today. Please don't hesitate to contact me if I can be of assistance to you before our next scheduled appointment.  Your next care management appointment is by telephone on 07/02/2024 at 9:30 am   Please call the care guide team at [phone] if you need to cancel, schedule, or reschedule an appointment.   Please call the Suicide and Crisis Lifeline: 988 go to [facility] Urgent [facility] [address], [place] [phone]) call 911 if you are experiencing a Mental Health or Behavioral Health Crisis or need someone to talk to.  Tobias [name] [name], PhD [facility], [facility] Social Worker Direct Dial: [phone]  Fax: [phone]

## 2024-06-18 NOTE — Assessment & Plan Note (Signed)
 S/p curative radiation therapy in 2016.

## 2024-06-18 NOTE — Patient Outreach (Signed)
 Complex Care Management   Visit Note  06/18/2024  Name:  Robert Bates MRN: [id number] DOB: 1941-01-18  Situation: Referral received for Complex Care Management related to SDOH Barriers:  Housing resources mailed I obtained verbal consent from Patient.  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   COPD (chronic obstructive pulmonary disease) (HCC)    Coronary artery disease    COVID-19 virus infection 04/23/2021   Diabetes type 2, controlled (HCC) 07/31/2017   Diverticulitis 07/29/2023   Dyspnea    Dysrhythmia    a fib   GERD (gastroesophageal reflux disease)    Hematuria    refuses work up or referral - understands risks of morbidity / mortality - 11/2008, 12/2008   Heme positive stool    History of hiatal hernia    History of kidney stones    Hyperlipemia    Meningioma (HCC) 10/25/2013   Follows with Dr. Rockey Peru.    Peripheral vascular disease (HCC)    Abdominal Aortic Aneursym   Pneumonia    as a child   Radiation 09/18/15-10/25/15   left lower lobe 70.2 Gy   Seizures (HCC) 02/18/2020   Tobacco abuse     Assessment:Patient is still waiting for the housing resources in the mail, SW will have it resent to [address] [place] [address] [phone]    SDOH Interventions    Flowsheet Row Patient Outreach Telephone from 06/04/2024 in [place] POPULATION HEALTH DEPARTMENT Patient Outreach Telephone from 05/27/2024 in [place] POPULATION HEALTH DEPARTMENT Office Visit from 05/21/2024 in [facility] Telephone from 08/14/2023 in [facility] Community Care Coordination Care Coordination from 05/22/2023  in [facility] Community Care Coordination Care Coordination from 05/19/2023 in [facility] Care Coordination  SDOH Interventions        Food Insecurity Interventions Intervention Not Indicated Intervention Not Indicated -- Intervention Not Indicated -- Intervention Not Indicated  Housing Interventions Community Resources Provided  [SW will send resources] Other (Comment)  [Patient will be provided housing resources] -- -- -- Intervention Not Indicated  Transportation Interventions Intervention Not Indicated Intervention Not Indicated -- Intervention Not Indicated  [drives self] -- Intervention Not Indicated  Utilities Interventions Intervention Not Indicated Intervention Not Indicated -- -- -- Intervention Not Indicated  Depression Interventions/Treatment  -- Medication Medication -- -- --  Financial Strain Interventions Intervention Not Indicated Other (Comment)  [Patient will be provided with community resources] -- -- Intervention Not Indicated --  Stress Interventions -- -- -- -- Provide Counseling --      Recommendation:   none  Follow Up Plan:   Telephone follow up appointment date/time:  07/02/2024 at 9:30 am  Tobias [name] [name], PhD [facility], [facility] Social Worker Direct Dial: [phone]  Fax: [phone]

## 2024-06-18 NOTE — Assessment & Plan Note (Signed)
 CXR today. Continue prednisone . Xopenex , Breztri .

## 2024-06-19 LAB — BASIC METABOLIC PANEL WITH GFR
BUN: 17 mg/dL (ref 7–25)
CO2: 26 mmol/L (ref 20–32)
Calcium: 8.9 mg/dL (ref 8.6–10.3)
Chloride: 104 mmol/L (ref 98–110)
Creat: 0.95 mg/dL (ref 0.70–1.22)
Glucose, Bld: 101 mg/dL — ABNORMAL HIGH (ref 65–99)
Potassium: 3.7 mmol/L (ref 3.5–5.3)
Sodium: 141 mmol/L (ref 135–146)
eGFR: 80 mL/min/1.73m2 (ref >=60)

## 2024-06-19 LAB — HEMOGLOBIN A1C
Hgb A1c MFr Bld: 6.2 % — ABNORMAL HIGH (ref <5.7)
Mean Plasma Glucose: 131 mg/dL
eAG (mmol/L): 7.3 mmol/L

## 2024-06-19 LAB — MICROALBUMIN / CREATININE URINE RATIO
Creatinine, Urine: 349 mg/dL — ABNORMAL HIGH (ref 20–320)
Microalb Creat Ratio: 54 mg/g{creat} — ABNORMAL HIGH (ref <30)
Microalb, Ur: 18.7 mg/dL

## 2024-06-21 ENCOUNTER — Other Ambulatory Visit (HOSPITAL_COMMUNITY): Payer: Self-pay

## 2024-06-21 ENCOUNTER — Other Ambulatory Visit (HOSPITAL_BASED_OUTPATIENT_CLINIC_OR_DEPARTMENT_OTHER): Payer: Self-pay

## 2024-06-21 ENCOUNTER — Telehealth: Payer: Self-pay | Admitting: Family

## 2024-06-21 DIAGNOSIS — R809 Proteinuria, unspecified: Secondary | ICD-10-CM

## 2024-06-21 MED ORDER — EMPAGLIFLOZIN 10 MG PO TABS
10.0000 mg | ORAL_TABLET | Freq: Every day | ORAL | 5 refills | Status: DC
  Filled 2024-06-21: qty 30, 30d supply, fill #0

## 2024-06-21 NOTE — Telephone Encounter (Signed)
 Please advise pt that he is showing some protein in his urine from his diabetes.  Please add jardiance  once daily.

## 2024-06-22 ENCOUNTER — Other Ambulatory Visit (HOSPITAL_BASED_OUTPATIENT_CLINIC_OR_DEPARTMENT_OTHER): Payer: Self-pay

## 2024-06-22 MED ORDER — FUROSEMIDE 40 MG PO TABS
40.0000 mg | ORAL_TABLET | Freq: Every day | ORAL | 0 refills | Status: DC
  Filled 2024-06-22: qty 90, 90d supply, fill #0

## 2024-06-22 MED ORDER — SPIRONOLACTONE 50 MG PO TABS
50.0000 mg | ORAL_TABLET | Freq: Every day | ORAL | 0 refills | Status: DC
  Filled 2024-06-22: qty 90, 90d supply, fill #0

## 2024-06-22 NOTE — Telephone Encounter (Signed)
 Copied from CRM #[id number]. Topic: General - Other >> Jun 22, 2024  1:20 PM Thersia C wrote: Reason for CRM: Patient called in regarding empagliflozin  (JARDIANCE ) 10 MG TABS tablet Stated he can not afford this, and needs another alternative, would like a nurse to give him a callback regarding this

## 2024-06-22 NOTE — Telephone Encounter (Signed)
This information was sent to provider in another message

## 2024-06-22 NOTE — Telephone Encounter (Signed)
 Patient reports this medication was 219 dollars and was not able to get it.

## 2024-06-23 ENCOUNTER — Other Ambulatory Visit (HOSPITAL_BASED_OUTPATIENT_CLINIC_OR_DEPARTMENT_OTHER): Payer: Self-pay

## 2024-06-23 DIAGNOSIS — M48062 Spinal stenosis, lumbar region with neurogenic claudication: Secondary | ICD-10-CM | POA: Diagnosis not present

## 2024-06-23 NOTE — Telephone Encounter (Signed)
 OK.  I have a note into his cardiologist about his medications and will let him know if we are going to make any further changes.

## 2024-06-24 ENCOUNTER — Other Ambulatory Visit: Payer: Self-pay | Admitting: Pain Medicine

## 2024-06-26 ENCOUNTER — Telehealth: Payer: Self-pay | Admitting: Family

## 2024-06-26 DIAGNOSIS — I5033 Acute on chronic diastolic (congestive) heart failure: Secondary | ICD-10-CM

## 2024-06-26 DIAGNOSIS — R809 Proteinuria, unspecified: Secondary | ICD-10-CM

## 2024-06-26 MED ORDER — LISINOPRIL 2.5 MG PO TABS
2.5000 mg | ORAL_TABLET | Freq: Every day | ORAL | 1 refills | Status: AC
  Filled 2024-06-26: qty 90, 90d supply, fill #0

## 2024-06-26 MED ORDER — SPIRONOLACTONE 25 MG PO TABS
25.0000 mg | ORAL_TABLET | Freq: Every day | ORAL | 3 refills | Status: AC
  Filled 2024-06-26: qty 90, 90d supply, fill #0

## 2024-06-26 NOTE — Telephone Encounter (Signed)
 Please advise pt that he has some protein in his urine.  I would recommend the following:   Decrease his spironolactone  from 50mg  to 25mg    2.  add lisinopril  2.5 mg  3.   Stop K Dur  Please return in 1 week for nurse visit/lab work. I would like to weigh him, check BP and get BMET.

## 2024-06-26 NOTE — Telephone Encounter (Signed)
-----   Message from Jon Garre Duke sent at 06/25/2024 12:14 PM EDT ----- Hi Alayiah Fontes, Sorry, I was out mid-week because I'm working the hospital this weekend.   Oh dear, I'm glad you caught the proteinuria.  We have been using the spironolactone  to help with diastolic heart failure. We could reduce the spironolactone  to 25 mg and add the ACEI or ARB? Alternatively, looks like he has a potassium supplement, could D/C that and keep the spiro at 50 mg and add losartan with a BMP in a week? If I remember correctly, his lungs were a little COPD-like, so I was avoiding ACEI, but you of course have seen him most recently. We typically have good luck with the losartan/spiro combo.   If we need to see him sooner than December, please let me know and I'm happy to get him in.  Thanks! Angie ----- Message ----- From: Daryl Setter, NP Sent: 06/22/2024   5:48 PM EDT To: Jon Garre Duke, PA  Hi Jon,  Wanted to run this by you.  He has proteinuria and I want to put him on an ace but he is on aldactone  and furosemide . I am afraid of hyperkalemia with aldactone . Would you be ok with me stopping the aldactone  and maybe going from 40mg  daily to 40mg  AM 20mg  PM on the furosemide ?  He can't afford the SGLT2 which was my other thought.  Thanks,  Estee Yohe

## 2024-06-27 ENCOUNTER — Other Ambulatory Visit (HOSPITAL_BASED_OUTPATIENT_CLINIC_OR_DEPARTMENT_OTHER): Payer: Self-pay

## 2024-06-28 ENCOUNTER — Other Ambulatory Visit (HOSPITAL_BASED_OUTPATIENT_CLINIC_OR_DEPARTMENT_OTHER): Payer: Self-pay

## 2024-06-28 NOTE — Telephone Encounter (Unsigned)
 Copied from CRM #[id number]. Topic: General - Call Back - No Documentation >> Jun 28, 2024 11:30 AM Rea BROCKS wrote: Reason for CRM: Patient returned phone call for [place].   [phone] (M)

## 2024-06-28 NOTE — Telephone Encounter (Signed)
 Patient notified of this information, medication changes and scheduled to come in next week for BP check, Weight and BMET

## 2024-06-28 NOTE — Telephone Encounter (Signed)
Called patient but no answer, will try later.

## 2024-06-29 ENCOUNTER — Other Ambulatory Visit: Payer: Self-pay

## 2024-06-29 NOTE — Patient Instructions (Signed)
 Visit Information  Thank you for taking time to visit with me today. Please don't hesitate to contact me if I can be of assistance to you before our next scheduled appointment.  Your next care management appointment is by telephone on 07/13/2024 at 10:30 am   Please call the care guide team at [phone] if you need to cancel, schedule, or reschedule an appointment.   Please call the Suicide and Crisis Lifeline: 988 if you are experiencing a Mental Health or Behavioral Health Crisis or need someone to talk to. Olam Ally, MSW, LCSW [place]  [facility], [facility] Licensed Clinical Social Worker Direct Dial: [phone]

## 2024-06-29 NOTE — Patient Outreach (Signed)
 Complex Care Management   Visit Note  06/29/2024  Name:  Robert Bates MRN: [id number] DOB: [birth year]  Situation:  Referral received for Complex Care Management related to Mental/Behavioral Health diagnosis mild anxiety and SDOH Barriers:  Housing . I obtained verbal consent from Patient.  Visit completed with patient  on the phone.   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 Rockey [place].    Peripheral vascular disease (HCC)    Abdominal Aortic Aneursym   Pneumonia    as a child   Radiation 09/18/15-10/25/15   left lower lobe 70.2 Gy   Seizures (HCC) 02/18/2020   Tobacco abuse     Assessment: Patient Reported Symptoms:  Cognitive Cognitive Status: Alert and oriented to person, place, and time, Normal speech and language skills Cognitive/Intellectual Conditions Management [RPT]: None reported or documented in medical history or problem list      Neurological Neurological Review of Symptoms: No symptoms reported    HEENT HEENT Symptoms Reported: No symptoms reported      Cardiovascular Cardiovascular Symptoms Reported: No symptoms  reported Does patient have uncontrolled Hypertension?: No    Respiratory Respiratory Symptoms Reported: No symptoms reported    Endocrine Endocrine Symptoms Reported: No symptoms reported Is patient diabetic?: Yes Is patient checking blood sugars at home?: No    Gastrointestinal Gastrointestinal Symptoms Reported: No symptoms reported      Genitourinary Genitourinary Symptoms Reported: No symptoms reported    Integumentary Integumentary Symptoms Reported: No symptoms reported    Musculoskeletal Musculoskelatal Symptoms Reviewed: Back pain Additional Musculoskeletal Details: Patient reports having surgery on 9/19 Musculoskeletal Management Strategies: Medication therapy, Medical device Falls in the past year?: Yes Number of falls in past year: 2 or more Was there an injury with Fall?: No Fall Risk Category Calculator: 2 Patient Fall Risk Level: Moderate Fall Risk Patient at Risk for Falls Due to: History of fall(s) Fall risk Follow up: Falls evaluation completed  Psychosocial Psychosocial Symptoms Reported: Anxiety - if selected complete GAD Additional Psychological Details: Patient anxiety assessed about the same Behavioral Management Strategies: Adequate rest, Medication therapy Behavioral Health Comment: Patient is still declining counseling Major Change/Loss/Stressor/Fears (CP): Environment Techniques to Cope with Loss/Stress/Change: Medication Quality of Family Relationships: stressful, supportive Do you feel physically threatened by others?: No    06/29/2024    PHQ2-9 Depression Screening   Little interest or pleasure in doing things    Feeling down, depressed, or hopeless    PHQ-2 - Total Score    Trouble falling or staying asleep, or sleeping too much  Feeling tired or having little energy    Poor appetite or overeating     Feeling bad about yourself - or that you are a failure or have let yourself or your family down    Trouble concentrating on things, such as  reading the newspaper or watching television    Moving or speaking so slowly that other people could have noticed.  Or the opposite - being so fidgety or restless that you have been moving around a lot more than usual    Thoughts that you would be better off dead, or hurting yourself in some way    PHQ2-9 Total Score    If you checked off any problems, how difficult have these problems made it for you to do your work, take care of things at home, or get along with other people    Depression Interventions/Treatment      There were no vitals filed for this visit.  Medications Reviewed Today     Reviewed by Sherren Olam FORBES KEN (Social Worker) on 06/29/24 at 1007  Med List Status: <None>   Medication Order Taking? Sig Documenting Provider Last Dose Status Informant  acetaminophen  (TYLENOL ) 500 MG tablet [id number]  Take 1-2 tablets (500-1,000 mg total) by mouth every 6 (six) hours as needed. Dwan Kyla HERO, PA-C  Active Self           Med Note JUSTINO, TAMMY B   Wed Jul 16, 2023 11:25 AM)    amiodarone  (PACERONE ) 200 MG tablet [id number]  Take 1 tablet (200 mg total) by mouth daily. Nahser, Aleene PARAS, MD  Active Self  atorvastatin  (LIPITOR ) 80 MG tablet [id number]  Take 1 tablet (80 mg total) by mouth daily. Daryl Setter, NP  Active Self  benzonatate  (TESSALON ) 100 MG capsule [id number]  Take 1 capsule (100 mg total) by mouth 3 (three) times daily as needed. O'Sullivan, Melissa, NP  Active   budesonide -glycopyrrolate -formoterol  (BREZTRI  AEROSPHERE) 160-9-4.8 MCG/ACT AERO inhaler [id number]  Inhale 2 puffs into the lungs 2 (two) times daily. Daryl Setter, NP  Active Self  diltiazem  (CARDIZEM  CD) 120 MG 24 hr capsule [id number]  Take 1 capsule (120 mg total) by mouth daily. Daryl Setter, NP  Active Self  empagliflozin  (JARDIANCE ) 10 MG TABS tablet [id number]  Take 1 tablet (10 mg total) by mouth daily before breakfast. Daryl Setter, NP  Active   furosemide  (LASIX ) 40 MG  tablet [id number]  Take 1 tablet (40 mg total) by mouth daily. Daryl Setter, NP  Active   glipiZIDE  (GLUCOTROL  XL) 2.5 MG 24 hr tablet [id number]  Take 1 tablet (2.5 mg total) by mouth daily with breakfast. Daryl Setter, NP  Active   levalbuterol  (XOPENEX ) 0.63 MG/3ML nebulizer solution [id number]  Take 3 mLs (0.63 mg total) by nebulization every 4 (four) hours as needed for wheezing or shortness of breath. Daryl Setter, NP  Active Self  lisinopril  (ZESTRIL ) 2.5 MG tablet [id number]  Take 1 tablet (2.5 mg total) by mouth daily. Daryl Setter, NP  Active   omeprazole  (PRILOSEC) 40 MG capsule [id number]  Take 1 capsule (40 mg total) by mouth daily. Daryl Setter, NP  Active Self  ondansetron  (ZOFRAN ) 4 MG tablet [id number]  Take 1 tablet (4 mg total) by mouth every 8 (eight) hours as needed for nausea or vomiting. Daryl Setter, NP  Active   predniSONE  (DELTASONE ) 20 MG tablet [id number]  Take 1 tablet (20 mg total) by mouth daily with breakfast. Olalere, Adewale A, MD  Active   prochlorperazine  (COMPAZINE )  10 MG tablet [id number]  Take 10 mg by mouth every 6 (six) hours as needed. [provider]  Active Self  rivaroxaban  (XARELTO ) 20 MG TABS tablet [id number]  TAKE 1 TABLET BY MOUTH EVERY DAY Daryl Setter, NP  Active Self  senna (SENNA LAXATIVE) 8.6 MG tablet [id number]  Take 1-4 tablets (8.6-34.4 mg total) by mouth 2 (two) times daily as needed for constipation   Active Self  sertraline  (ZOLOFT ) 50 MG tablet [id number]  Take 1 tablet (50 mg total) by mouth daily. Daryl Setter, NP  Active Self  spironolactone  (ALDACTONE ) 25 MG tablet [id number]  Take 1 tablet (25 mg total) by mouth daily. Daryl Setter, NP  Active   tamsulosin  (FLOMAX ) 0.4 MG CAPS capsule [id number]  Take 1 capsule (0.4 mg total) by mouth at bedtime. Daryl Setter, NP  Active   traMADol  (ULTRAM ) 50 MG tablet [id number]  Take 1 tablet (50 mg total) by mouth every 12 (twelve)  hours as needed. Daryl Setter, NP  Active             Recommendation:   Continue Current Plan of Care  Follow Up Plan:   Telephone follow-up 07/13/2024 at 10:30 Am  Olam Ally, MSW, LCSW [place]  [facility], [facility] Licensed Clinical Social Worker Direct Dial: [phone]

## 2024-06-30 ENCOUNTER — Ambulatory Visit

## 2024-07-02 ENCOUNTER — Other Ambulatory Visit: Payer: Self-pay | Admitting: Licensed Clinical Social Worker

## 2024-07-02 NOTE — Patient Instructions (Signed)
 Visit Information  Thank you for taking time to visit with me today. Please don't hesitate to contact me if I can be of assistance to you before our next scheduled appointment.  Your next care management appointment is by telephone on 07/16/2024 at 1:00 pm  Please call the care guide team at [phone] if you need to cancel, schedule, or reschedule an appointment.   Please call the Suicide and Crisis Lifeline: 988 go to [facility] [address], [place] [phone]) call 911 if you are experiencing a Mental Health or Behavioral Health Crisis or need someone to talk to.  Tobias [name] [name], PhD [facility], [facility] Social Worker Direct Dial: [phone]  Fax: [phone]

## 2024-07-02 NOTE — Patient Outreach (Signed)
 Complex Care Management   Visit Note  07/02/2024  Name:  Robert Bates MRN: [id number] DOB: [birth year]  Situation: Referral received for Complex Care Management related to SDOH Barriers:  Housing resources mailed I obtained verbal consent from Patient.  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 Rockey Peru.    Peripheral vascular disease (HCC)    Abdominal Aortic Aneursym   Pneumonia    as a child   Radiation 09/18/15-10/25/15   left lower lobe 70.2 Gy   Seizures (HCC) 02/18/2020   Tobacco abuse     Assessment: Patient Reported Symptoms:  Cognitive        Neurological      HEENT        Cardiovascular      Respiratory      Endocrine      Gastrointestinal        Genitourinary      Integumentary      Musculoskeletal          Psychosocial            07/02/2024    PHQ2-9 Depression Screening   Little interest or pleasure in doing things    Feeling down, depressed, or hopeless    PHQ-2 - Total Score    Trouble falling or staying asleep, or sleeping too much    Feeling tired or  having little energy    Poor appetite or overeating     Feeling bad about yourself - or that you are a failure or have let yourself or your family down    Trouble concentrating on things, such as reading the newspaper or watching television    Moving or speaking so slowly that other people could have noticed.  Or the opposite - being so fidgety or restless that you have been moving around a lot more than usual    Thoughts that you would be better off dead, or hurting yourself in some way    PHQ2-9 Total Score    If you checked off any problems, how difficult have these problems made it for you to do your work, take care of things at home, or get along with other people    Depression Interventions/Treatment      There were no vitals filed for this visit.  Medications Reviewed Today   Medications were not reviewed in this encounter     Recommendation:   none  Follow Up Plan:   Telephone follow up appointment date/time:  07/16/2024  at 1:00 pm  Tobias [name] [name], PhD [facility], [facility] Social Worker Direct Dial: [phone]  Fax: [phone]

## 2024-07-06 ENCOUNTER — Other Ambulatory Visit (HOSPITAL_BASED_OUTPATIENT_CLINIC_OR_DEPARTMENT_OTHER): Payer: Self-pay

## 2024-07-07 ENCOUNTER — Ambulatory Visit

## 2024-07-07 ENCOUNTER — Other Ambulatory Visit

## 2024-07-07 NOTE — Addendum Note (Signed)
 Addended by: TRUDY CURVIN RAMAN on: 07/07/2024 08:10 AM   Modules accepted: Orders

## 2024-07-09 ENCOUNTER — Ambulatory Visit: Admitting: Family

## 2024-07-09 ENCOUNTER — Ambulatory Visit: Payer: Self-pay | Admitting: *Deleted

## 2024-07-09 NOTE — Telephone Encounter (Signed)
 FYI Only or Action Required?: Action required by provider: update on patient condition.  Patient was last seen in primary care on 06/18/2024 by Daryl Setter, NP.  Called Nurse Triage reporting Fever (Fever, body ache, sore throat).  Symptoms began yesterday.  Interventions attempted: Rest, hydration, or home remedies.  Symptoms are: gradually worsening.  Triage Disposition: Call PCP Within 24 Hours  Patient/caregiver understands and will follow disposition?: yes- home instructions given- patient to call back with results   Reason for Disposition  [1] HIGH RISK patient (e.g., weak immune system, age > 64 years, obesity with BMI 30 or higher, pregnant, chronic lung disease or other chronic medical condition) AND [2] COVID symptoms (e.g., cough, fever)  (Exceptions: Already seen by PCP and no new or worsening symptoms.)  Answer Assessment - Initial Assessment Questions Recent exposure to COVID- patient started symptoms yesterday. Patient states he is weak and unable to come to office for appointment. Spoke with [place] -his caregiver/family member- she does have home test and is going to test patient- will call back with results. Advised ED for SOB, chest pain, weakness, dehydration. May need virtual visit for + COVID test  1. COVID-19 EXPOSURE: Please describe how you were exposed to someone with a COVID-19 infection.     Possibly exposed by niece 2. PLACE of CONTACT: Where were you when you were exposed to COVID-19? (e.g., home, school, medical waiting room; which city?)     Yesterday- at home visiting 3. TYPE of CONTACT: How much contact was there? (e.g., sitting next to, live in same house, work in same office, same building)     Visiting in the home 4. DURATION of CONTACT: How long were you in contact with the COVID-19 patient? (e.g., a few seconds, passed by person, a few minutes, 15 minutes or longer, live with the patient)     Longer than 15 minutes 5. MASK: Were  you wearing a mask? Was the other person wearing a mask? Note: wearing a mask reduces the risk of an otherwise close contact.     no 6. DATE of CONTACT: When did you have contact with a COVID-19 patient? (e.g., how many days ago)     yesterday 7. COMMUNITY SPREAD: Do you live in or have you traveled to an area where there are lots of COVID-19 cases (community spread)? (See public health department website, if unsure)       yes 8. SYMPTOMS: Do you have any symptoms? (e.g., fever, cough, breathing difficulty, loss of taste or smell)     Fever, cough, sore throat 9. VACCINE: Have you gotten the COVID-19 vaccine? If Yes, ask: Which one, how many shots, when did you get it?     Patient has had 4 vaccines  11. HIGH RISK: Do you have any heart or lung problems? (e.g., asthma, COPD, heart failure) Do you have a weak immune system or other risk factors? (e.g., HIV positive, chemotherapy, renal failure, diabetes mellitus, sickle cell anemia, obesity)       yes  Answer Assessment - Initial Assessment Questions 1. COVID-19 DIAGNOSIS: How do you know that you have COVID? (e.g., positive lab test or self-test, diagnosed by doctor or NP/PA, symptoms after exposure).     Exposure- will test today 2. COVID-19 EXPOSURE: Was there any known exposure to COVID before the symptoms began? CDC Definition of close contact: within 6 feet (2 meters) for a total of 15 minutes or more over a 24-hour period.      yes 3. ONSET: When  did the COVID-19 symptoms start?      yesterday 4. WORST SYMPTOM: What is your worst symptom? (e.g., cough, fever, shortness of breath, muscle aches)     Fatigue,fever 5. COUGH: Do you have a cough? If Yes, ask: How bad is the cough?       yes 6. FEVER: Do you have a fever? If Yes, ask: What is your temperature, how was it measured, and when did it start?     Yes-unsure 7. RESPIRATORY STATUS: Describe your breathing? (e.g., normal; shortness of breath,  wheezing, unable to speak)      SOB is normal with patient per caregiver- will check O2sat 8. BETTER-SAME-WORSE: Are you getting better, staying the same or getting worse compared to yesterday?  If getting worse, ask, In what way?     worse 9. OTHER SYMPTOMS: Do you have any other symptoms?  (e.g., chills, fatigue, headache, loss of smell or taste, muscle pain, sore throat)     Lack of appetite  10. HIGH RISK DISEASE: Do you have any chronic medical problems? (e.g., asthma, heart or lung disease, weak immune system, obesity, etc.)       yes  13. O2 SATURATION MONITOR:  Do you use an oxygen  saturation monitor (pulse oximeter) at home? If Yes, ask What is your reading (oxygen  level) today? What is your usual oxygen  saturation reading? (e.g., 95%)       Will check today also  Protocols used: Coronavirus (COVID-19) Exposure-A-AH, Coronavirus (COVID-19) Diagnosed or Suspected-A-AH   Copied from CRM #[id number]. Topic: Clinical - Red Word Triage >> Jul 09, 2024 12:03 PM Thersia BROCKS wrote: [facility] that prompted transfer to Nurse Triage: Patient called in had a appointment to be seen today but canceled it because he couldn't even get out the bed, patient stated its painful to swallow and he had a low fever and chills as well

## 2024-07-13 ENCOUNTER — Ambulatory Visit (INDEPENDENT_AMBULATORY_CARE_PROVIDER_SITE_OTHER): Admitting: Family Medicine

## 2024-07-13 ENCOUNTER — Other Ambulatory Visit (HOSPITAL_BASED_OUTPATIENT_CLINIC_OR_DEPARTMENT_OTHER): Payer: Self-pay

## 2024-07-13 ENCOUNTER — Other Ambulatory Visit (INDEPENDENT_AMBULATORY_CARE_PROVIDER_SITE_OTHER)

## 2024-07-13 ENCOUNTER — Other Ambulatory Visit: Payer: Self-pay

## 2024-07-13 VITALS — BP 111/63 | HR 71

## 2024-07-13 DIAGNOSIS — I10 Essential (primary) hypertension: Secondary | ICD-10-CM

## 2024-07-13 LAB — BASIC METABOLIC PANEL WITH GFR
BUN: 17 mg/dL (ref 6–23)
CO2: 28 meq/L (ref 19–32)
Calcium: 8.9 mg/dL (ref 8.4–10.5)
Chloride: 99 meq/L (ref 96–112)
Creatinine, Ser: 0.99 mg/dL (ref 0.40–1.50)
GFR: 70.67 mL/min (ref >60.00)
Glucose, Bld: 120 mg/dL — ABNORMAL HIGH (ref 70–99)
Potassium: 3.6 meq/L (ref 3.5–5.1)
Sodium: 139 meq/L (ref 135–145)

## 2024-07-13 NOTE — Progress Notes (Signed)
 Pt here for Blood pressure check and wt check per Melissa.   Pt currently takes: spironolactone  (ALDACTONE ) 25 MG tablet, lisinopril  (ZESTRIL ) 2.5 MG tablet, furosemide  (LASIX ) 40 MG tablet , diltiazem  (CARDIZEM  CD) 120 MG 24 hr capsule , amiodarone  (PACERONE ) 200 MG tablet    Pt reports compliance with medication.  Wt: 172lb  BP today @ = 111/63 HR = 71  Pt reports he is still having intermittent n/v. Zofran  not helping much. He has also started having diarrhea. I asked him if he has tried anything OTC for the diarrhea- he said he hasn't, informed I'd follow-up with PCP to see if Imodium  would be okay for him to take with his heart medications. Appt w/ PCP 07/21/24 (PCPs first available)    Pt advised per Dr. Antonio- follow-up w/ PCP.

## 2024-07-13 NOTE — Patient Outreach (Signed)
 LCSW called patient at scheduled time of visit. LCSW was unable to reach patient and left voicemail. LCSW scheduled another visit for 07/20/2024 at 11:30. This is the 1st unsuccessful attempt.  Olam Ally, MSW, LCSW [place]  [facility], [facility] Licensed Clinical Social Worker Direct Dial: [phone]

## 2024-07-13 NOTE — Patient Instructions (Signed)
 Visit Information  Robert Bates - I am sorry I was unable to reach you today for our scheduled appointment. I work with Daryl Setter, NP and am calling to support your healthcare needs. Please contact me at [phone] at your earliest convenience. I look forward to speaking with you soon.   Your next care management appointment is by telephone on 07/20/2024 at 11:30 am   Please call the care guide team at [phone] if you need to cancel, schedule, or reschedule an appointment.   Please call 911 if you are experiencing a Mental Health or Behavioral Health Crisis or need someone to talk to.  Olam Ally, MSW, LCSW [place]  [facility], [facility] Licensed Clinical Social Worker Direct Dial: [phone]

## 2024-07-14 ENCOUNTER — Telehealth: Payer: Self-pay

## 2024-07-14 ENCOUNTER — Ambulatory Visit: Payer: Self-pay | Admitting: Family

## 2024-07-14 NOTE — Telephone Encounter (Signed)
 Per Melissa okay for Pt to take imodium  for diarrhea.   LMOM informing Pt.

## 2024-07-14 NOTE — Telephone Encounter (Signed)
 Copied from CRM [id number]. Topic: Clinical - Medication Question >> Jul 14, 2024  9:46 AM Tinnie C wrote: Reason for CRM: Pt has questions about medications he is on and feels one of them might be making him feel sick. He is requesting to speak with Tammy. [phone] (Mobile)

## 2024-07-15 ENCOUNTER — Other Ambulatory Visit: Payer: Self-pay | Admitting: Pharmacist

## 2024-07-15 ENCOUNTER — Other Ambulatory Visit (HOSPITAL_BASED_OUTPATIENT_CLINIC_OR_DEPARTMENT_OTHER): Payer: Self-pay

## 2024-07-15 NOTE — Telephone Encounter (Signed)
 Patient states that for the last 6 weeks he has had diarrhea and nausea. Feels nausea anytime of day. He reports that ondansetron  does not relieve nausea.   Patient was seen in office by Dr Cruz 2 days ago on 07/13/2024 with these same complaints. Dr Cruz recommended the following:  I asked him if he has tried anything OTC for the diarrhea- he said he hasn't, informed I'd follow-up with PCP to see if Imodium  would be okay for him to take with his heart medications. Appt w/ PCP 07/21/24 (PCPs first available)  Patient would like to see if any of his medications might be causing nausea and diarrhea. He would like me to call back when his niece is available so she can help to tell me which medications he is currently taking.  Appt made for later today 9/11 at 2pm.   Madelin Ray, PharmD Clinical Pharmacist [facility]

## 2024-07-15 NOTE — Progress Notes (Signed)
 07/15/2024 Name: Robert Bates MRN: [id number] DOB: [birth year]  Chief Complaint  Patient presents with   Medication Adherence    Robert Bates is a 83 y.o. year old male who presented for a telephone visit.   They were referred to the pharmacist by their PCP for assistance in managing complex medication management.    Subjective: Patient called clinical pharmacist states that he would like to go over his medications and discuss if any of them could be causing nausea or diarrhea.  Patient states he has had diarrhea and nausea for about 6 weeks. He has taken ondansetron  for nausea but does not always relieve nausea.  He was seen earlier this week for nausea and diarrhea. Dr Cruz was going to check with his PCP about starting an antidiarrheal. Patient will follow up with PCP next week 07/21/2024     Medication Instructions    acetaminophen  (TYLENOL ) 500-1,000 mg, Oral, Every 6 hours PRN    amiodarone  (PACERONE ) 200 mg, Oral, Daily LR 05/12/20205 for 90 DS - patient states he is taking daily but does not need refill   atorvastatin  (LIPITOR ) 80 mg, Oral, Daily Patient states he is not taking atorvastatin  because his niece told him he did not need it.    benzonatate  (TESSALON ) 100 mg, Oral, 3 times daily PRN As needed   budesonide -glycopyrrolate -formoterol  (BREZTRI  AEROSPHERE) 160-9-4.8 MCG/ACT AERO inhaler 2 puffs, Inhalation, 2 times daily Taking as needed - no recent refill because he has some left from 2024 patient assistance program delivery   diltiazem  (CARDIZEM  CD) 120 mg, Oral, Daily Taking - last refill was 07/13/2024 for 30 days   empagliflozin  (JARDIANCE ) 10 mg, Oral, Daily before breakfast Not Taking - patient unsure why but I suspect is due to cost   furosemide  (LASIX ) 40 mg, Oral, Daily Patient states he has not taken furosemide  in that last 3 days because he has been busy. He weight this mornig was 172 lbs   glipiZIDE  (GLUCOTROL  XL) 2.5 mg, Oral, Daily with breakfast Not  taking - states he does not have at home   levalbuterol  (XOPENEX ) 0.63 mg, Nebulization, Every 4 hours PRN Uses as needed   lisinopril  (ZESTRIL ) 2.5 mg, Oral, Daily Not taking - states he does not have at home but was filled and picked up per pharmacy for 90 day supply on 07/06/2024   omeprazole  (PRILOSEC) 40 mg, Oral, Daily Taking - last refill was 06/18/2024 for 90 days   ondansetron  (ZOFRAN ) 4 mg, Oral, Every 8 hours PRN Taking as needed   senna (SENNA LAXATIVE) 8.6 MG tablet Take 1-4 tablets (8.6-34.4 mg total) by mouth 2 (two) times daily as needed for constipation Not taking-  has not needed / no constipation   sertraline  (ZOLOFT ) 50 mg, Oral, Daily Not taking - per patient does not have at home. Last refill was 05/21/24 for 30 day supply   spironolactone  (ALDACTONE ) 25 mg, Oral, Daily Not Taking- per patient does not have at home but was filled and picked up per pharmacy for 90 day supply on 07/06/2024   tamsulosin  (FLOMAX ) 0.4 mg, Oral, Daily at bedtime Taking - last refill was 06/15/24 for 90 day supply   traMADol  (ULTRAM ) 50 mg, Oral, Every 12 hours PRN Taking - per patient sometimes he takes 2 tablets once a day instead of 1 tablet twice a day   Xarelto  20 mg, Oral, Daily Taking - last refill was 04/26/2024 for 90 days       Medication Access/Adherence  Current Pharmacy:  [facility] [address], [address] [place] [address] Phone: [phone] Fax: [phone]  [facility] - [place], [place] - [address] [place] [address] Phone: [phone] Fax: [phone]  [facility] [address] [place] - [address] [place] [address] Phone: [phone] Fax: [phone]  [facility] [address], [place] - [address] [place] [address] Phone: [phone] Fax: [phone]     Objective:  Lab Results  Component  Value Date   HGBA1C 6.2 (H) 06/18/2024    Lab Results  Component Value Date   CREATININE 0.99 07/13/2024   BUN 17 07/13/2024   NA 139 07/13/2024   K 3.6 07/13/2024   CL 99 07/13/2024   CO2 28 07/13/2024    Lab Results  Component Value Date   CHOL 142 08/07/2023   HDL 79 08/07/2023   LDLCALC 47 08/07/2023   LDLDIRECT 142.2 12/26/2008   TRIG 81 08/07/2023   CHOLHDL 1.8 08/07/2023      Assessment/Plan:   Medication Management: - Reviewed current medication list with patient over the phone today  - He will look at home for lisinopril  and spironolactone  - I did verify with our pharmacy that both these meds were filled 07/06/2024 for 90 day supply and they were picked up.  - Coordinated with pharmacy to fill sertraline  and glipizide . - recommended patient take tramadol  only 1 tablet at a time up to 2 times a day. Taking 2 tabs at once might be causing nausea - Also recommended patient restart checking blood glucose at home, especially when he feels nauseated - could be hypo or hyperglycemia.  - Recommended he restart atorvastatin . Discussed CVD benefits. - Will reach out to his PCP about SGLT2. Jardiance  cost is too high. He could qualify for patient assistance program for Farxiga . Though since his last A1c was 6.2% would stop glipizide  if SGLT2 started.  - Meds that could cause nausea include sertraline  (though per refill records I don't think he has been taking sertraline  in the last 3 weeks), tramadol  up to 13% incidence, tamsulosin  2-4%, omeprazole  3%, Xarelto  10%,  Meds that could cause diarrhea - omeprazole  4%, lisinopril  1%, tamsulosin  4-6%, tramadol  1-10%,    Follow Up Plan: Will see patient prior to PCP appt to review med bottles in office on 07/21/2024  Madelin Ray, PharmD Clinical Pharmacist [facility]

## 2024-07-16 ENCOUNTER — Encounter: Payer: Self-pay | Admitting: Licensed Clinical Social Worker

## 2024-07-16 ENCOUNTER — Other Ambulatory Visit: Payer: Self-pay | Admitting: Licensed Clinical Social Worker

## 2024-07-19 ENCOUNTER — Encounter (HOSPITAL_COMMUNITY): Payer: Self-pay

## 2024-07-19 NOTE — Progress Notes (Signed)
 Error

## 2024-07-19 NOTE — Progress Notes (Signed)
 07/23/24 surgery was cancelled.

## 2024-07-19 NOTE — Progress Notes (Signed)
 .Surgical Instructions   Your procedure is scheduled on Friday, 07/23/24. Report to [facility] Main Entrance A at 5:30 A.M., then check in with the Admitting office. Any questions or running late day of surgery: call [phone]    Remember:  Do not eat or drink after midnight the night before your surgery-Thursday    Take these medicines the morning of surgery with A SIP OF WATER : amiodarone  (PACERONE )  diltiazem  (CARDIZEM  CD)  omeprazole  (PRILOSEC)   May take these medicines IF NEEDED: acetaminophen  (TYLENOL )  benzonatate  (TESSALON ) Capsules BREZTRI  AEROSPHERE) Inhaler XOPENEX ) 0.63 MG/3ML nebulizer tx   One week prior to surgery, STOP taking any Aspirin  (unless otherwise instructed by your surgeon) Aleve, Naproxen, Ibuprofen, Motrin, Advil, Goody's, BC's, all herbal medications, fish oil , and non-prescription vitamins.                     Do NOT Smoke (Tobacco/Vaping) for 24 hours prior to your procedure.  If you use a CPAP at night, you may bring your mask/headgear for your overnight stay.   You will be asked to remove any contacts, glasses, piercing's, hearing aid's, dentures/partials prior to surgery. Please bring cases for these items if needed.     SURGICAL WAITING ROOM VISITATION Patients may have no more than 2 support people in the waiting area - these visitors may rotate.   Pre-op nurse will coordinate an appropriate time for 1 ADULT support person, who may not rotate, to accompany patient in pre-op.  Children under the age of 68 must have an adult with them who is not the patient and must remain in the main waiting area with an adult.  If the patient needs to stay at the hospital during part of their recovery, the visitor guidelines for inpatient rooms apply.  Please refer to the [facility] website for the visitor guidelines for any additional information.   If you received a COVID test during your pre-op visit  it is requested that you wear a mask when out  in public, stay away from anyone that may not be feeling well and notify your surgeon if you develop symptoms. If you have been in contact with anyone that has tested positive in the last 10 days please notify you surgeon.      Pre-operative 5 CHG Bathing Instructions   You can play a key role in reducing the risk of infection after surgery. Your skin needs to be as free of germs as possible. You can reduce the number of germs on your skin by washing with CHG (chlorhexidine  gluconate) soap before surgery. CHG is an antiseptic soap that kills germs and continues to kill germs even after washing.   DO NOT use if you have an allergy to chlorhexidine /CHG or antibacterial soaps. If your skin becomes reddened or irritated, stop using the CHG and notify one of our RNs at [phone].   Please shower with the CHG soap starting 4 days before surgery using the following schedule:     Please keep in mind the following:  DO NOT shave, including legs and underarms, starting the day of your first shower.   You may shave your face at any point before/day of surgery.  Place clean sheets on your bed the day you start using CHG soap. Use a clean washcloth (not used since being washed) for each shower. DO NOT sleep with pets once you start using the CHG.   CHG Shower Instructions:  Wash your face and private area with normal  soap. If you choose to wash your hair, wash first with your normal shampoo.  After you use shampoo/soap, rinse your hair and body thoroughly to remove shampoo/soap residue.  Turn the water  OFF and apply about 3 tablespoons (45 ml) of CHG soap to a CLEAN washcloth.  Apply CHG soap ONLY FROM YOUR NECK DOWN TO YOUR TOES (washing for 3-5 minutes)  DO NOT use CHG soap on face, private areas, open wounds, or sores.  Pay special attention to the area where your surgery is being performed.  If you are having back surgery, having someone wash your back for you may be helpful. Wait 2 minutes  after CHG soap is applied, then you may rinse off the CHG soap.  Pat dry with a clean towel  Put on clean clothes/pajamas   If you choose to wear lotion, please use ONLY the CHG-compatible lotions that are listed below.  Additional instructions for the day of surgery: DO NOT APPLY any lotions, deodorants, cologne, or perfumes.   Do not bring valuables to the hospital. [facility] is not responsible for any belongings/valuables. Do not wear nail polish, gel polish, artificial nails, or any other type of covering on natural nails (fingers and toes) Do not wear jewelry or makeup Put on clean/comfortable clothes.  Please brush your teeth.  Ask your nurse before applying any prescription medications to the skin.     CHG Compatible Lotions   Aveeno Moisturizing lotion  Cetaphil Moisturizing Cream  Cetaphil Moisturizing Lotion  Clairol Herbal Essence Moisturizing Lotion, Dry Skin  Clairol Herbal Essence Moisturizing Lotion, Extra Dry Skin  Clairol Herbal Essence Moisturizing Lotion, Normal Skin  Curel Age Defying Therapeutic Moisturizing Lotion with Alpha Hydroxy  Curel Extreme Care Body Lotion  Curel Soothing Hands Moisturizing Hand Lotion  Curel Therapeutic Moisturizing Cream, Fragrance-Free  Curel Therapeutic Moisturizing Lotion, Fragrance-Free  Curel Therapeutic Moisturizing Lotion, Original Formula  Eucerin Daily Replenishing Lotion  Eucerin Dry Skin Therapy Plus Alpha Hydroxy Crme  Eucerin Dry Skin Therapy Plus Alpha Hydroxy Lotion  Eucerin Original Crme  Eucerin Original Lotion  Eucerin Plus Crme Eucerin Plus Lotion  Eucerin TriLipid Replenishing Lotion  Keri Anti-Bacterial Hand Lotion  Keri Deep Conditioning Original Lotion Dry Skin Formula Softly Scented  Keri Deep Conditioning Original Lotion, Fragrance Free Sensitive Skin Formula  Keri Lotion Fast Absorbing Fragrance Free Sensitive Skin Formula  Keri Lotion Fast Absorbing Softly Scented Dry Skin Formula  Keri  Original Lotion  Keri Skin Renewal Lotion Keri Silky Smooth Lotion  Keri Silky Smooth Sensitive Skin Lotion  Nivea Body Creamy Conditioning Oil  Nivea Body Extra Enriched Lotion  Nivea Body Original Lotion  Nivea Body Sheer Moisturizing Lotion Nivea Crme  Nivea Skin Firming Lotion  NutraDerm 30 Skin Lotion  NutraDerm Skin Lotion  NutraDerm Therapeutic Skin Cream  NutraDerm Therapeutic Skin Lotion  ProShield Protective Hand Cream  Provon moisturizing lotion  Please read over the following fact sheets that you were given.

## 2024-07-20 ENCOUNTER — Inpatient Hospital Stay (HOSPITAL_COMMUNITY)
Admission: RE | Admit: 2024-07-20 | Discharge: 2024-07-20 | Disposition: A | Discharge status: Home / Self Care | Source: Ambulatory Visit

## 2024-07-20 ENCOUNTER — Telehealth: Payer: Self-pay

## 2024-07-20 ENCOUNTER — Other Ambulatory Visit: Payer: Self-pay

## 2024-07-20 NOTE — Telephone Encounter (Signed)
 Copied from CRM [id number]. Topic: Clinical - Medical Advice >> Jul 20, 2024 11:52 AM Anairis L wrote: Reason for CRM: Patient is requesting a call please between 1pm-1:30pm .Was not able to answer phone earlier

## 2024-07-20 NOTE — Patient Instructions (Signed)
 Visit Information Robert Bates - I am sorry I was unable to reach you today for our scheduled appointment. I work with Daryl Setter, NP and am calling to support your healthcare needs. Please contact me at [phone] at your earliest convenience. I look forward to speaking with you soon.    Your next care management appointment is by telephone on 08/09/2024 at 11:30 am   Please call the care guide team at [phone] if you need to cancel, schedule, or reschedule an appointment.   Please call 911 if you are experiencing a Mental Health or Behavioral Health Crisis or need someone to talk to. Olam Ally, MSW, LCSW [place]  [facility], [facility] Licensed Clinical Social Worker Direct Dial: [phone]

## 2024-07-20 NOTE — Patient Outreach (Signed)
 LCSW called patient for visit and was unable to reach hi, LCSW left VM. BSW attempted to reach patient on 9/12 therefore this is the 2nd unsuccessful attempt. LCSW rescheduled patient for 08/09/2024 at 11:30 am.   Olam Ally, MSW, LCSW [place]  [facility], [facility] Licensed Clinical Social Worker Direct Dial: [phone]

## 2024-07-21 ENCOUNTER — Encounter: Payer: Self-pay | Admitting: Family

## 2024-07-21 ENCOUNTER — Ambulatory Visit (INDEPENDENT_AMBULATORY_CARE_PROVIDER_SITE_OTHER): Admitting: Family

## 2024-07-21 ENCOUNTER — Ambulatory Visit (INDEPENDENT_AMBULATORY_CARE_PROVIDER_SITE_OTHER): Admitting: Pharmacist

## 2024-07-21 ENCOUNTER — Other Ambulatory Visit (HOSPITAL_BASED_OUTPATIENT_CLINIC_OR_DEPARTMENT_OTHER): Payer: Self-pay

## 2024-07-21 VITALS — BP 98/60 | HR 60 | Temp 97.8°F | Resp 18 | Ht 69.0 in | Wt 178.0 lb

## 2024-07-21 DIAGNOSIS — I4819 Other persistent atrial fibrillation: Secondary | ICD-10-CM

## 2024-07-21 DIAGNOSIS — F419 Anxiety disorder, unspecified: Secondary | ICD-10-CM

## 2024-07-21 DIAGNOSIS — R110 Nausea: Secondary | ICD-10-CM

## 2024-07-21 DIAGNOSIS — E785 Hyperlipidemia, unspecified: Secondary | ICD-10-CM

## 2024-07-21 DIAGNOSIS — Z79899 Other long term (current) drug therapy: Secondary | ICD-10-CM

## 2024-07-21 DIAGNOSIS — E7800 Pure hypercholesterolemia, unspecified: Secondary | ICD-10-CM

## 2024-07-21 DIAGNOSIS — R809 Proteinuria, unspecified: Secondary | ICD-10-CM

## 2024-07-21 DIAGNOSIS — Z23 Encounter for immunization: Secondary | ICD-10-CM

## 2024-07-21 DIAGNOSIS — E1142 Type 2 diabetes mellitus with diabetic polyneuropathy: Secondary | ICD-10-CM

## 2024-07-21 DIAGNOSIS — K219 Gastro-esophageal reflux disease without esophagitis: Secondary | ICD-10-CM

## 2024-07-21 DIAGNOSIS — E1129 Type 2 diabetes mellitus with other diabetic kidney complication: Secondary | ICD-10-CM

## 2024-07-21 DIAGNOSIS — I10 Essential (primary) hypertension: Secondary | ICD-10-CM

## 2024-07-21 DIAGNOSIS — R197 Diarrhea, unspecified: Secondary | ICD-10-CM | POA: Diagnosis not present

## 2024-07-21 DIAGNOSIS — E119 Type 2 diabetes mellitus without complications: Secondary | ICD-10-CM

## 2024-07-21 MED ORDER — GABAPENTIN 100 MG PO CAPS
100.0000 mg | ORAL_CAPSULE | Freq: Three times a day (TID) | ORAL | 3 refills | Status: AC
  Filled 2024-07-21: qty 90, 30d supply, fill #0
  Filled 2024-09-07: qty 90, 30d supply, fill #1

## 2024-07-21 MED ORDER — ONDANSETRON HCL 4 MG PO TABS
4.0000 mg | ORAL_TABLET | Freq: Three times a day (TID) | ORAL | 1 refills | Status: DC | PRN
  Filled 2024-07-21: qty 30, 10d supply, fill #0
  Filled 2024-08-20: qty 30, 10d supply, fill #1

## 2024-07-21 NOTE — Assessment & Plan Note (Signed)
 Maintained on omeprazole - stable.

## 2024-07-21 NOTE — Assessment & Plan Note (Signed)
 Lab Results  Component Value Date   HGBA1C 6.2 (H) 06/18/2024   HGBA1C 6.2 01/28/2024   HGBA1C 7.2 (H) 09/16/2023   Lab Results  Component Value Date   MICROALBUR 18.7 06/18/2024   LDLCALC 47 08/07/2023   CREATININE 0.99 07/13/2024   Farxiga  patient assist pending- not taking jardiance . Pt assist pending for farxiga .

## 2024-07-21 NOTE — Assessment & Plan Note (Signed)
Uncontrolled, will restart gabapentin.

## 2024-07-21 NOTE — Assessment & Plan Note (Signed)
 BP Readings from Last 3 Encounters:  07/21/24 98/60  07/13/24 111/63  06/18/24 (!) 107/55   BP stable.

## 2024-07-21 NOTE — Progress Notes (Signed)
 07/21/2024 Name: Robert Bates MRN: [id number] DOB: May 05, 1941  Chief Complaint  Patient presents with   Medication Management    Robert Bates is a 83 y.o. year old male who presented for an office visit.   They were referred to the pharmacist by their PCP for assistance in managing complex medication management.    Subjective:      Medication Instructions    acetaminophen  (TYLENOL ) 500-1,000 mg, Oral, Every 6 hours PRN    amiodarone  (PACERONE ) 200 mg, Oral, Daily LR 05/12/20205 for 90 DS - patient states he is taking daily. Has about 1 week of medication on hand   atorvastatin  (LIPITOR ) 80 mg, Oral, Daily Patient states he is not taking atorvastatin  because his niece told him he did not need it.    benzonatate  (TESSALON ) 100 mg, Oral, 3 times daily PRN As needed   budesonide -glycopyrrolate -formoterol  (BREZTRI  AEROSPHERE) 160-9-4.8 MCG/ACT AERO inhaler 2 puffs, Inhalation, 2 times daily Taking as needed - no recent refill because he has some left from 2024 patient assistance program delivery   diltiazem  (CARDIZEM  CD) 120 mg, Oral, Daily Taking - last refill was 07/13/2024 for 30 days   empagliflozin  (JARDIANCE ) 10 mg, Oral, Daily before breakfast Not Taking - due to cost. Plan to change to Farxiga  and apply for medication assistance program with [place] and [place].    furosemide  (LASIX ) 40 mg, Oral, Daily Taking most mornings unless he has doctors appointment   glipiZIDE  (GLUCOTROL  XL) 2.5 mg, Oral, Daily with breakfast Not taking - states he does not have at home   levalbuterol  (XOPENEX ) 0.63 mg, Nebulization, Every 4 hours PRN Uses as needed   lisinopril  (ZESTRIL ) 2.5 mg, Oral, Daily Not taking - states his niece said he did not need lisinopril    omeprazole  (PRILOSEC) 40 mg, Oral, Daily Taking - last refill was 06/18/2024 for 90 days   ondansetron  (ZOFRAN ) 4 mg, Oral, Every 8 hours PRN Taking as needed   senna (SENNA LAXATIVE) 8.6 MG tablet Take 1-4 tablets (8.6-34.4 mg total) by mouth 2 (two)  times daily as needed for constipation Not taking-  has not needed / no constipation   Potassium chlortide 10 mEQ, daily Taking but was not on his current medication list.    sertraline  (ZOLOFT ) 50 mg, Oral, Daily Not taking - per patient does not have at home. Last refill was 05/21/24 for 30 day supply   spironolactone  (ALDACTONE ) 25 mg, Oral, Daily Has both 50mg  and 25mg  strengths. He has 50mg  tablets in her pill container but he should be on 25mg  dose.    tamsulosin  (FLOMAX ) 0.4 mg, Oral, Daily at bedtime Taking - last refill was 06/15/24 for 90 day supply   traMADol  (ULTRAM ) 50 mg, Oral, Every 12 hours PRN Taking - per patient sometimes he takes 2 tablets once a day instead of 1 tablet twice a day   Xarelto  20 mg, Oral, Daily Taking - last refill was 04/26/2024 for 90 days       Medication Access/Adherence  Current Pharmacy:  [facility] [facility] [address] [place] [address] Phone: [phone] Fax: [phone]  [facility] - [place], [place] - [address] [place] [address] Phone: [phone] Fax: [phone]  [facility] [address], [place] - [address] [place] [address] Phone: [phone] Fax: [phone]  [facility] [address], [place] - [address] [place] [address] Phone: [phone] Fax: [phone]     Objective:  Lab Results  Component Value Date   HGBA1C 6.2 (H) 06/18/2024    Lab Results  Component Value Date   CREATININE 0.99 07/13/2024   BUN 17 07/13/2024   NA 139 07/13/2024   K 3.6 07/13/2024   CL 99 07/13/2024   CO2 28 07/13/2024    Lab Results  Component Value Date   CHOL 142 08/07/2023   HDL 79 08/07/2023   LDLCALC 47 08/07/2023   LDLDIRECT 142.2 12/26/2008   TRIG 81 08/07/2023   CHOLHDL 1.8 08/07/2023      Assessment/Plan:   Medication Management: -  Reviewed current medication list with patient  - Recommended he restart lisinopril  and atorvastatin . 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  - Reminded patient to pick up refills for sertraline  - recommended patient take tramadol  only 1 tablet at a time up to 2 times a day. Taking 2 tabs at once might be causing nausea - Patient signed application today for Farxiga  patient assistance program. Explained that once approved Farxiga  will replace Jardiance . Recommended hold glipizide .  - Removed Rx bottle for spironolactone  50mg  - he will start taking spironolactone  25mg  daily.   Follow Up Plan: 2 weeks.   Madelin Ray, PharmD Clinical Pharmacist [facility]

## 2024-07-21 NOTE — Progress Notes (Unsigned)
 Subjective:     Patient ID: Robert Bates, male    DOB: 28-Feb-1941, 83 y.o.   MRN: [id number]  Chief Complaint  Patient presents with   Follow-up    HPI  Discussed the use of AI scribe software for clinical note transcription with the patient, who gave verbal consent to proceed.  History of Present Illness   Robert Bates is an 83 year old male who presents with persistent diarrhea and nausea. He is accompanied by his girlfriend.  He has experienced persistent diarrhea for over six weeks, occurring with every meal, described as watery. Nausea has persisted for over two weeks, with an episode of vomiting plain water . He is concerned about the presence of roaches in his residence possibly affecting his symptoms.  He takes spironolactone  and Zofran  for nausea but is currently out of Zofran . He plans to restart Zoloft  for anxiety management. He uses a Breztri  inhaler as needed for respiratory issues.  He lives with four others, including his niece and her three great-grandchildren, which he finds stressful. He desires to move to improve his health and reduce stress.    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 Rockey [place].    Peripheral vascular disease (HCC)    Abdominal Aortic Aneursym   Pneumonia    as a child   Radiation 09/18/15-10/25/15   left lower lobe 70.2 Gy   Seizures (HCC) 02/18/2020   Tobacco abuse     Past Surgical History:  Procedure Laterality Date   CHOLECYSTECTOMY N/A 07/23/2017   Procedure: LAPAROSCOPIC CHOLECYSTECTOMY;  Surgeon: Stevie Herlene Righter, MD;  Location: [facility];  Service: General;  Laterality: N/A;   CLIPPING OF ATRIAL APPENDAGE Left 05/08/2021   Procedure: CLIPPING OF ATRIAL APPENDAGE USING 45 ATRICLIP;  Surgeon: German Bartlett PEDLAR, MD;  Location: [facility];  Service: Open Heart Surgery;  Laterality: Left;   COLONOSCOPY     CORONARY ARTERY BYPASS GRAFT N/A 05/08/2021   Procedure: CORONARY ARTERY BYPASS GRAFTING (CABG)X 3 USING LEFT INTERNAL MAMMARY ARTERY AND RIGHT GREATER SAPEHNOUS VEIN;  Surgeon: German Bartlett PEDLAR, MD;  Location: [facility];  Service: Open Heart Surgery;  Laterality: N/A;   ENDOVEIN HARVEST  OF GREATER SAPHENOUS VEIN Right 05/08/2021   Procedure: ENDOVEIN HARVEST OF GREATER SAPHENOUS VEIN;  Surgeon: German Bartlett PEDLAR, MD;  Location: [facility];  Service: Open Heart Surgery;  Laterality: Right;   EYE SURGERY Bilateral    Cataracts removed w/ lens implant   HERNIA REPAIR     Left 36 years ago . Right inguinal hernia repair 10-01-17 Dr. Stevie   INGUINAL HERNIA REPAIR Right 10/01/2017   Procedure: RIGHT INGUINAL HERNIA REPAIR WITH MESH;  Surgeon: Kinsinger, Herlene Righter, MD;  Location: [facility];  Service: General;  Laterality: Right;  TAP BLOCK   INSERTION OF MESH Right 10/01/2017   Procedure: INSERTION OF MESH;   Surgeon: Stevie Herlene Righter, MD;  Location: [facility];  Service: General;  Laterality: Right;   IR THORACENTESIS ASP PLEURAL SPACE W/IMG GUIDE  05/18/2021   IR THORACENTESIS ASP PLEURAL SPACE W/IMG GUIDE  06/07/2021   LEFT HEART CATH AND CORONARY ANGIOGRAPHY N/A 04/20/2021   Procedure: LEFT HEART CATH AND CORONARY ANGIOGRAPHY;  Surgeon: Darron Deatrice LABOR, MD;  [facility]: [facility];  Service: Cardiovascular;  Laterality: N/A;   TEE WITHOUT CARDIOVERSION N/A 05/08/2021   Procedure: TRANSESOPHAGEAL ECHOCARDIOGRAM (TEE);  Surgeon: German Bartlett PEDLAR, MD;  Location: [facility] OR;  Service: Open Heart Surgery;  Laterality: N/A;   TONSILLECTOMY     TONSILLECTOMY     VIDEO BRONCHOSCOPY Bilateral 07/26/2015   Procedure: VIDEO BRONCHOSCOPY WITH FLUORO;  Surgeon: Ozell KATHEE America, MD;  Location: [facility];  Service: Cardiopulmonary;  Laterality: Bilateral;   VIDEO BRONCHOSCOPY WITH ENDOBRONCHIAL NAVIGATION N/A 08/23/2015   Procedure: VIDEO BRONCHOSCOPY WITH ENDOBRONCHIAL NAVIGATION;  Surgeon: Dallas KATHEE Jude, MD;  Location: [facility];  Service: Thoracic;  Laterality: N/A;   VIDEO BRONCHOSCOPY WITH ENDOBRONCHIAL ULTRASOUND N/A 08/23/2015   Procedure: VIDEO BRONCHOSCOPY WITH ENDOBRONCHIAL ULTRASOUND;  Surgeon: Dallas KATHEE Jude, MD;  Location: [facility];  Service: Thoracic;  Laterality: N/A;    Family History  Problem Relation Age of Onset   Leukemia Father    Emphysema Father    Learning disabilities Son    Atrial fibrillation Son    Leukemia Other    Stroke Other     Social History   Socioeconomic History   Marital status: Widowed    Spouse name: Not on file   Number of children: 2   Years of education: Not on file   Highest education level: Not on file  Occupational History   Occupation: Retired    Associate Professor: [employer]    Comment: truck Air traffic controller: [employer]  Tobacco Use   Smoking status: Former    Current packs/day: 0.00    Average packs/day: 1 pack/day for 57.0 years (57.0 ttl  pk-yrs)    Types: Cigarettes, Cigars    Start date: 08/07/1958    Quit date: 08/08/2015    Years since quitting: 8.9   Smokeless tobacco: Former    Types: Chew    Quit date: 11/04/1958   Tobacco comments:    Will smoke cigar every once in awhile.  Vaping daily started a couple weeks ago.  04/18/22 hfb    Patient states he is still vaping. AB, CMA 09-10-2023  Vaping Use   Vaping status: Some Days  Substance and Sexual Activity   Alcohol use: Not Currently    Alcohol/week: 0.0 standard drinks of alcohol   Drug use: No   Sexual activity: Not Currently  Other Topics Concern   Not on file  Social History Narrative   Not on  file   Social Drivers of Health   Financial Resource Strain: Medium Risk (06/18/2024)   Overall Financial Resource Strain (CARDIA)    Difficulty of Paying Living Expenses: Somewhat hard  Food Insecurity: No Food Insecurity (06/18/2024)   Hunger Vital Sign    Worried About Running Out of Food in the Last Year: Never true    Ran Out of Food in the Last Year: Never true  Transportation Needs: No Transportation Needs (06/18/2024)   PRAPARE - Administrator, Civil Service (Medical): No    Lack of Transportation (Non-Medical): No  Physical Activity: Insufficiently Active (08/05/2021)   Exercise Vital Sign    Days of Exercise per Week: 2 days    Minutes of Exercise per Session: 40 min  Stress: Stress Concern Present (05/22/2023)   [employer] of Occupational Health - Occupational Stress Questionnaire    Feeling of Stress : Rather much  Social Connections: Moderately Isolated (05/13/2022)   Social Connection and Isolation Panel    Frequency of Communication with Friends and Family: Twice a week    Frequency of Social Gatherings with Friends and Family: Once a week    Attends Religious Services: Never    Database administrator or Organizations: No    Attends Banker Meetings: Never    Marital Status: Married  Catering manager Violence: Not At  Risk (06/18/2024)   Humiliation, Afraid, Rape, and Kick questionnaire    Fear of Current or Ex-Partner: No    Emotionally Abused: No    Physically Abused: No    Sexually Abused: No    Outpatient Medications Prior to Visit  Medication Sig Dispense Refill   acetaminophen  (TYLENOL ) 500 MG tablet Take 1-2 tablets (500-1,000 mg total) by mouth every 6 (six) hours as needed. (Patient taking differently: Take 500 mg by mouth every 12 (twelve) hours as needed.) 30 tablet 0   amiodarone  (PACERONE ) 200 MG tablet Take 1 tablet (200 mg total) by mouth daily. 90 tablet 3   benzonatate  (TESSALON ) 100 MG capsule Take 1 capsule (100 mg total) by mouth 3 (three) times daily as needed. 30 capsule 0   budesonide -glycopyrrolate -formoterol  (BREZTRI  AEROSPHERE) 160-9-4.8 MCG/ACT AERO inhaler Inhale 2 puffs into the lungs 2 (two) times daily. (Patient taking differently: Inhale 2 puffs into the lungs 2 (two) times daily as needed.)     diltiazem  (CARDIZEM  CD) 120 MG 24 hr capsule Take 1 capsule (120 mg total) by mouth daily. 90 capsule 1   furosemide  (LASIX ) 40 MG tablet Take 1 tablet (40 mg total) by mouth daily. 90 tablet 0   levalbuterol  (XOPENEX ) 0.63 MG/3ML nebulizer solution Take 3 mLs (0.63 mg total) by nebulization every 4 (four) hours as needed for wheezing or shortness of breath. 75 mL 12   lisinopril  (ZESTRIL ) 2.5 MG tablet Take 1 tablet (2.5 mg total) by mouth daily. 90 tablet 1   omeprazole  (PRILOSEC) 40 MG capsule Take 1 capsule (40 mg total) by mouth daily. 90 capsule 1   potassium chloride  (KLOR-CON ) 10 MEQ tablet Take 10 mEq by mouth daily.     rivaroxaban  (XARELTO ) 20 MG TABS tablet TAKE 1 TABLET BY MOUTH EVERY DAY 90 tablet 1   spironolactone  (ALDACTONE ) 25 MG tablet Take 1 tablet (25 mg total) by mouth daily. (Patient taking differently: Take 1 tablet (25 mg total) by mouth daily.) 90 tablet 3   tamsulosin  (FLOMAX ) 0.4 MG CAPS capsule Take 1 capsule (0.4 mg total) by mouth at bedtime. 90 capsule 1  traMADol  (ULTRAM ) 50 MG tablet Take 50 mg by mouth every 12 (twelve) hours as needed for severe pain (pain score 7-10).     ondansetron  (ZOFRAN ) 4 MG tablet Take 1 tablet (4 mg total) by mouth every 8 (eight) hours as needed for nausea or vomiting. 30 tablet 1   atorvastatin  (LIPITOR ) 80 MG tablet Take 1 tablet (80 mg total) by mouth daily. (Patient not taking: Reported on 07/21/2024) 90 tablet 1   sertraline  (ZOLOFT ) 50 MG tablet Take 1 tablet (50 mg total) by mouth daily. (Patient not taking: Reported on 07/21/2024) 30 tablet 3   empagliflozin  (JARDIANCE ) 10 MG TABS tablet Take 1 tablet (10 mg total) by mouth daily before breakfast. (Patient not taking: Reported on 07/21/2024) 30 tablet 5   glipiZIDE  (GLUCOTROL  XL) 2.5 MG 24 hr tablet Take 1 tablet (2.5 mg total) by mouth daily with breakfast. (Patient not taking: Reported on 07/21/2024) 90 tablet 0   senna (SENNA LAXATIVE) 8.6 MG tablet Take 1-4 tablets (8.6-34.4 mg total) by mouth 2 (two) times daily as needed for constipation (Patient not taking: Reported on 07/21/2024) 100 tablet 0   No facility-administered medications prior to visit.    Allergies  Allergen Reactions   Iodine Swelling    Neck, gland swelling   Iohexol  Swelling    Neck, gland swelling   Metformin  And Related Diarrhea   Metformin  Nausea Only    ROS See HPI    Objective:    Physical Exam Constitutional:      General: He is not in acute distress.    Appearance: He is well-developed.  HENT:     Head: Normocephalic and atraumatic.  Cardiovascular:     Rate and Rhythm: Normal rate and regular rhythm.     Heart sounds: No murmur heard. Pulmonary:     Effort: Pulmonary effort is normal. No respiratory distress.     Breath sounds: Normal breath sounds. No wheezing or rales.  Skin:    General: Skin is warm and dry.  Neurological:     Mental Status: He is alert and oriented to person, place, and time.  Psychiatric:        Behavior: Behavior normal.        Thought  Content: Thought content normal.      BP 98/60 (BP Location: Right Arm, Patient Position: Sitting, Cuff Size: Normal)   Pulse 60   Temp 97.8 F (36.6 C) (Oral)   Resp 18   Ht 5' 9 (1.753 m)   Wt 178 lb (80.7 kg)   SpO2 95%   BMI 26.29 kg/m  Wt Readings from Last 3 Encounters:  07/21/24 178 lb (80.7 kg)  06/18/24 178 lb (80.7 kg)  05/21/24 179 lb (81.2 kg)       Assessment & Plan:   Problem List Items Addressed This Visit       Unprioritized   Pure hypercholesterolemia   Lab Results  Component Value Date   CHOL 142 08/07/2023   HDL 79 08/07/2023   LDLCALC 47 08/07/2023   LDLDIRECT 142.2 12/26/2008   TRIG 81 08/07/2023   CHOLHDL 1.8 08/07/2023  Tolerating statin, update lipids today.        Primary hypertension   BP Readings from Last 3 Encounters:  07/21/24 98/60  07/13/24 111/63  06/18/24 (!) 107/55   BP stable.        Persistent atrial fibrillation (HCC)   Stable on amiodarone  and xarelto . Continues to follow with cardiology.  Hyperlipidemia   Relevant Orders   Lipid panel   GERD   Maintained on omeprazole - stable.       Relevant Medications   ondansetron  (ZOFRAN ) 4 MG tablet   Diarrhea - Primary    Persistent diarrhea and nausea, possible viral or dietary cause. Tramadol  unlikely to cause diarrhea. - Provide stool specimen collection kits for analysis. - Prescribe Zofran  for nausea.      Relevant Orders   Clostridium difficile culture-fecal   Stool Culture   Diabetic peripheral neuropathy (HCC)   Uncontrolled, will restart gabapentin .       Relevant Medications   gabapentin  (NEURONTIN ) 100 MG capsule   Diabetes type 2, controlled (HCC)   Lab Results  Component Value Date   HGBA1C 6.2 (H) 06/18/2024   HGBA1C 6.2 01/28/2024   HGBA1C 7.2 (H) 09/16/2023   Lab Results  Component Value Date   MICROALBUR 18.7 06/18/2024   LDLCALC 47 08/07/2023   CREATININE 0.99 07/13/2024   Farxiga  patient assist pending- not taking  jardiance .       Anxiety   Uncontrolled- not taking sertraline . Recommend that he restart.       Other Visit Diagnoses       Nausea       Relevant Medications   ondansetron  (ZOFRAN ) 4 MG tablet     Needs flu shot       Relevant Orders   Flu vaccine HIGH DOSE PF(Fluzone Trivalent) (Completed)       I have discontinued Hershel Windsor's senna, glipiZIDE , and empagliflozin . I am also having him start on gabapentin . Additionally, I am having him maintain his acetaminophen , amiodarone , diltiazem , omeprazole , levalbuterol , Breztri  Aerosphere, Xarelto , atorvastatin , sertraline , tamsulosin , furosemide , benzonatate , spironolactone , lisinopril , traMADol , potassium chloride , and ondansetron .  Meds ordered this encounter  Medications   gabapentin  (NEURONTIN ) 100 MG capsule    Sig: Take 1 capsule (100 mg total) by mouth 3 (three) times daily.    Dispense:  90 capsule    Refill:  3    Supervising Provider:   DOMENICA BLACKBIRD A [[id number]]   ondansetron  (ZOFRAN ) 4 MG tablet    Sig: Take 1 tablet (4 mg total) by mouth every 8 (eight) hours as needed for nausea or vomiting.    Dispense:  30 tablet    Refill:  1    Supervising Provider:   DOMENICA BLACKBIRD A [[id number]]

## 2024-07-21 NOTE — Assessment & Plan Note (Signed)
 Stable on amiodarone  and xarelto . Continues to follow with cardiology.

## 2024-07-21 NOTE — Assessment & Plan Note (Signed)
 Lab Results  Component Value Date   CHOL 142 08/07/2023   HDL 79 08/07/2023   LDLCALC 47 08/07/2023   LDLDIRECT 142.2 12/26/2008   TRIG 81 08/07/2023   CHOLHDL 1.8 08/07/2023  Tolerating statin, update lipids today.

## 2024-07-22 DIAGNOSIS — R197 Diarrhea, unspecified: Secondary | ICD-10-CM | POA: Insufficient documentation

## 2024-07-22 NOTE — Assessment & Plan Note (Signed)
 Uncontrolled- not taking sertraline . Recommend that he restart.

## 2024-07-22 NOTE — Patient Instructions (Signed)
 VISIT SUMMARY:  During your visit, we discussed your persistent diarrhea and nausea, diabetes management, COPD, anxiety, restless legs syndrome, hyperlipidemia, and chronic pain. We provided a stool specimen kit, prescribed medications, and administered a flu shot.  YOUR PLAN:  GASTROENTERITIS WITH DIARRHEA AND NAUSEA: You have been experiencing persistent diarrhea and nausea, possibly due to a viral or dietary cause. -We provided you with stool specimen collection kits for analysis. -We prescribed Zofran  for your nausea.  TYPE 2 DIABETES MELLITUS: Your diabetes is well-controlled with an A1c of 6.2. -We removed glipizide  from your medication list. -We are continuing efforts to obtain Farxiga  for you through patient assistance.  CHRONIC OBSTRUCTIVE PULMONARY DISEASE (COPD): You use a Breztri  inhaler as needed for your COPD. -We recommend you get a COVID booster due to your lung history. -We administered a flu shot today.  ATRIAL FIBRILLATION: Your atrial fibrillation is managed by cardiology.  ANXIETY: You are experiencing anxiety and are not currently taking Zoloft . -We recommend you restart sertraline  (Zoloft ) for your anxiety.  Neuropathy: You have a burning sensation in your legs, possibly related to neuropathy -We recommend you restart gabapentin  at 100 mg, up to three times a day as needed, primarily at bedtime.  HYPERLIPIDEMIA: Your cholesterol levels need to be rechecked. -We will recheck your cholesterol levels.  CHRONIC PAIN: We discussed your chronic pain management and the safety concerns with combining pain and medications like xanax.

## 2024-07-22 NOTE — Assessment & Plan Note (Signed)
  Persistent diarrhea and nausea, possible viral or dietary cause. Tramadol  unlikely to cause diarrhea. - Provide stool specimen collection kits for analysis. - Prescribe Zofran  for nausea.

## 2024-07-23 ENCOUNTER — Ambulatory Visit (HOSPITAL_COMMUNITY): Admission: RE | Admit: 2024-07-23 | Source: Home / Self Care | Admitting: Pain Medicine

## 2024-07-23 ENCOUNTER — Ambulatory Visit: Admit: 2024-07-23 | Admitting: Pain Medicine

## 2024-07-23 ENCOUNTER — Encounter (HOSPITAL_COMMUNITY): Admission: RE | Payer: Self-pay | Source: Home / Self Care

## 2024-07-23 ENCOUNTER — Other Ambulatory Visit (HOSPITAL_BASED_OUTPATIENT_CLINIC_OR_DEPARTMENT_OTHER): Payer: Self-pay

## 2024-07-23 SURGERY — DECOMPRESSION, SPINE LUMBAR, MINIMALLY INVASIVE
Anesthesia: General

## 2024-07-27 ENCOUNTER — Ambulatory Visit: Payer: Self-pay

## 2024-07-27 ENCOUNTER — Emergency Department (HOSPITAL_BASED_OUTPATIENT_CLINIC_OR_DEPARTMENT_OTHER)
Admission: EM | Admit: 2024-07-27 | Discharge: 2024-07-27 | Disposition: A | Discharge status: Home / Self Care | Attending: Emergency Medicine | Admitting: Emergency Medicine

## 2024-07-27 ENCOUNTER — Ambulatory Visit

## 2024-07-27 ENCOUNTER — Other Ambulatory Visit: Payer: Self-pay | Admitting: Family

## 2024-07-27 ENCOUNTER — Other Ambulatory Visit (HOSPITAL_BASED_OUTPATIENT_CLINIC_OR_DEPARTMENT_OTHER): Payer: Self-pay

## 2024-07-27 ENCOUNTER — Other Ambulatory Visit: Payer: Self-pay

## 2024-07-27 ENCOUNTER — Encounter (HOSPITAL_BASED_OUTPATIENT_CLINIC_OR_DEPARTMENT_OTHER): Payer: Self-pay

## 2024-07-27 DIAGNOSIS — L03114 Cellulitis of left upper limb: Secondary | ICD-10-CM | POA: Diagnosis not present

## 2024-07-27 DIAGNOSIS — L0390 Cellulitis, unspecified: Secondary | ICD-10-CM

## 2024-07-27 DIAGNOSIS — Z23 Encounter for immunization: Secondary | ICD-10-CM | POA: Insufficient documentation

## 2024-07-27 DIAGNOSIS — G8929 Other chronic pain: Secondary | ICD-10-CM

## 2024-07-27 MED ORDER — TETANUS-DIPHTH-ACELL PERTUSSIS 5-2.5-18.5 LF-MCG/0.5 IM SUSY
0.5000 mL | PREFILLED_SYRINGE | Freq: Once | INTRAMUSCULAR | Status: AC
  Administered 2024-07-27: 0.5 mL via INTRAMUSCULAR
  Filled 2024-07-27: qty 0.5

## 2024-07-27 MED ORDER — CEPHALEXIN 500 MG PO CAPS
500.0000 mg | ORAL_CAPSULE | Freq: Four times a day (QID) | ORAL | 0 refills | Status: DC
  Filled 2024-07-27: qty 20, 5d supply, fill #0

## 2024-07-27 MED ORDER — TRAMADOL HCL 50 MG PO TABS
50.0000 mg | ORAL_TABLET | Freq: Two times a day (BID) | ORAL | 0 refills | Status: DC | PRN
  Filled 2024-07-27: qty 60, 30d supply, fill #0

## 2024-07-27 NOTE — Telephone Encounter (Signed)
Pt coming to [facility].

## 2024-07-27 NOTE — ED Triage Notes (Signed)
 Pt reports a lac on the back of left hand x 8 days. States it got cut on the car. Today swelling and drainage noted. Denies fevers.

## 2024-07-27 NOTE — ED Provider Notes (Signed)
 [place] [facility] AT [facility] Provider Note   CSN: [id number] Arrival date & time: 07/27/24  1228     Patient presents with: Arm Injury   Bhavik Cabiness is a 83 y.o. male patient who presents to the emergency department today for further evaluation of a wound to the dorsal aspect of the left hand that happened 8 days ago.  Patient states he initially had 2 dog scratches on the top of his hand and then his girlfriend accidentally put the trunk down causing a scrape on his hand towards the first digit.  Hand is now swollen, warm, and draining some purulent material.  He denies any fever or chills.  Patient able to use the hand.  Tdap is not up-to-date.    Arm Injury      Prior to Admission medications   Medication Sig Start Date End Date Taking? Authorizing Provider  acetaminophen  (TYLENOL ) 500 MG tablet Take 1-2 tablets (500-1,000 mg total) by mouth every 6 (six) hours as needed. Patient taking differently: Take 500 mg by mouth every 12 (twelve) hours as needed. 05/16/21   Zimmerman, Donielle M, PA-C  amiodarone  (PACERONE ) 200 MG tablet Take 1 tablet (200 mg total) by mouth daily. 10/15/23   Nahser, Aleene PARAS, MD  atorvastatin  (LIPITOR ) 80 MG tablet Take 1 tablet (80 mg total) by mouth daily. Patient not taking: Reported on 07/21/2024 04/30/24   O'Sullivan, Melissa, NP  benzonatate  (TESSALON ) 100 MG capsule Take 1 capsule (100 mg total) by mouth 3 (three) times daily as needed. 06/16/24   O'Sullivan, Melissa, NP  budesonide -glycopyrrolate -formoterol  (BREZTRI  AEROSPHERE) 160-9-4.8 MCG/ACT AERO inhaler Inhale 2 puffs into the lungs 2 (two) times daily. Patient taking differently: Inhale 2 puffs into the lungs 2 (two) times daily as needed. 04/14/24   O'Sullivan, Melissa, NP  diltiazem  (CARDIZEM  CD) 120 MG 24 hr capsule Take 1 capsule (120 mg total) by mouth daily. 01/28/24   O'Sullivan, Melissa, NP  furosemide  (LASIX ) 40 MG tablet Take 1 tablet (40 mg total) by mouth  daily. 06/22/24   O'Sullivan, Melissa, NP  gabapentin  (NEURONTIN ) 100 MG capsule Take 1 capsule (100 mg total) by mouth 3 (three) times daily. 07/21/24   O'Sullivan, Melissa, NP  levalbuterol  (XOPENEX ) 0.63 MG/3ML nebulizer solution Take 3 mLs (0.63 mg total) by nebulization every 4 (four) hours as needed for wheezing or shortness of breath. 04/14/24   O'Sullivan, Melissa, NP  lisinopril  (ZESTRIL ) 2.5 MG tablet Take 1 tablet (2.5 mg total) by mouth daily. 06/26/24   O'Sullivan, Melissa, NP  omeprazole  (PRILOSEC) 40 MG capsule Take 1 capsule (40 mg total) by mouth daily. 01/28/24   O'Sullivan, Melissa, NP  ondansetron  (ZOFRAN ) 4 MG tablet Take 1 tablet (4 mg total) by mouth every 8 (eight) hours as needed for nausea or vomiting. 07/21/24   Daryl Setter, NP  potassium chloride  (KLOR-CON ) 10 MEQ tablet Take 10 mEq by mouth daily.    [provider]  rivaroxaban  (XARELTO ) 20 MG TABS tablet TAKE 1 TABLET BY MOUTH EVERY DAY 04/26/24   O'Sullivan, Melissa, NP  sertraline  (ZOLOFT ) 50 MG tablet Take 1 tablet (50 mg total) by mouth daily. Patient not taking: Reported on 07/21/2024 05/21/24   O'Sullivan, Melissa, NP  spironolactone  (ALDACTONE ) 25 MG tablet Take 1 tablet (25 mg total) by mouth daily. Patient taking differently: Take 1 tablet (25 mg total) by mouth daily. 06/26/24   O'Sullivan, Melissa, NP  tamsulosin  (FLOMAX ) 0.4 MG CAPS capsule Take 1 capsule (0.4 mg total) by mouth at  bedtime. 06/11/24   O'Sullivan, Melissa, NP  traMADol  (ULTRAM ) 50 MG tablet Take 1 tablet (50 mg total) by mouth every 12 (twelve) hours as needed. 07/27/24 08/26/24  O'Sullivan, Melissa, NP    Allergies: Iodine, Iohexol , Metformin  and related, and Metformin     Review of Systems  All other systems reviewed and are negative.   Updated Vital Signs BP 113/62 (BP Location: Left Arm)   Pulse 75   Temp 97.6 F (36.4 C) (Oral)   Resp 18   SpO2 92%   Physical Exam Vitals and nursing note reviewed.  Constitutional:       Appearance: Normal appearance.  HENT:     Head: Normocephalic and atraumatic.  Eyes:     General:        Right eye: No discharge.        Left eye: No discharge.     Conjunctiva/sclera: Conjunctivae normal.  Pulmonary:     Effort: Pulmonary effort is normal.  Skin:    General: Skin is warm and dry.     Findings: No rash.     Comments: There is some erythema and questionable purulence  from the deep abrasion over the dorsal aspect of the left hand.  Other 2 scratches look fine.  Not really warm to palpation.  Patient able to make a full fist.  He is neurovascularly intact.  Neurological:     General: No focal deficit present.     Mental Status: He is alert.  Psychiatric:        Mood and Affect: Mood normal.        Behavior: Behavior normal.     (all labs ordered are listed, but only abnormal results are displayed) Labs Reviewed - No data to display  EKG: None  Radiology: No results found.   Procedures   Medications Ordered in the ED  Tdap (BOOSTRIX ) injection 0.5 mL (has no administration in time range)     Medical Decision Making Delwin Raczkowski is a 83 y.o. male patient who presents to the emergency department today for further evaluation of wound check.  This certainly does appear to be early cellulitis.  Patient is overall nontoxic-appearing.  Vital signs are completely normal.  Will update Tdap here.  Do not feel that imaging is warranted at this time.  Will go ahead and place the patient on keflex  to cover this wound infection. Dog scratches do not appear to be infected. I will have him follow-up with his primary care doctor for further evaluation.  Strict return precautions were discussed.  He is safe for discharge.   Risk Prescription drug management.    Final diagnoses:  Cellulitis, unspecified cellulitis site    ED Discharge Orders     None          Theotis Peers [place], [place] 07/27/24 1307    Freddi Hamilton, MD 07/27/24 [phone]

## 2024-07-27 NOTE — ED Notes (Signed)
 Pt alert and oriented X 4 at the time of discharge. RR even and unlabored. No acute distress noted. Pt verbalized understanding of discharge instructions as discussed. Pt ambulatory to lobby at time of discharge.

## 2024-07-27 NOTE — Telephone Encounter (Signed)
 FYI Only or Action Required?: FYI only for provider.  Patient was last seen in primary care on 07/21/2024 by Daryl Setter, NP.  Called Nurse Triage reporting Hand Injury.  Symptoms began a week ago.  Interventions attempted: Rest, hydration, or home remedies.  Symptoms are: unchanged.  Triage Disposition: See HCP Within 4 Hours (Or PCP Triage)  Patient/caregiver understands and will follow disposition?: Yes, will follow disposition  Copied from CRM #[id number]. Topic: Clinical - Red Word Triage >> Jul 27, 2024 11:45 AM Pinkey ORN wrote: Red Word that prompted transfer to Nurse Triage: Swelling >> Jul 27, 2024 11:47 AM Pinkey ORN wrote: Patient hit his left hand on the car Saturday and his hand is now bleeding, swollen, tender and red.  Reason for Disposition  [1] Large swelling or bruise (> 2 inches or 5 cm) AND [2] can't use injured hand normally (e.g., make a fist, open hand fully, hold a glass of water )  Answer Assessment - Initial Assessment Questions 1. MECHANISM: How did the injury happen?     Hand was hit by the trunk 2. ONSET: When did the injury happen? (e.g., minutes, hours ago)      About a week ago 3. APPEARANCE of INJURY: What does the injury look like?      Swollen, bled a bit 4. SEVERITY: Can you use your hand normally? Can you bend your fingers into a ball and then fully open them?     Still moving hand and fingers like normal 5. SIZE: For cuts, bruises, or swelling, ask: How large is it? (e.g., inches or centimeters; entire hand)      Pt is on blood thinners, POA/caller states that skin was torn initially 6. PAIN: How bad is the pain? (Scale 0-10; or none, mild, moderate, severe)     4 7. TETANUS: For any breaks in the skin, ask: When was your last tetanus booster?     Pt unsure of tetanus 8. OTHER SYMPTOMS: Do you have any other symptoms?      Denies  Per chart pt is not UTD with Tdap.Pt advised to utilize UC, pt agreeable.  Bleeding controlled at time of call.  Protocols used: Hand Injury-A-AH

## 2024-07-27 NOTE — Discharge Instructions (Signed)
 Please take antibiotics as prescribed.  Would like for you to follow-up with your primary care doctor later this week if you can.  You can return to the emergency department for any worsening symptoms.

## 2024-07-29 ENCOUNTER — Other Ambulatory Visit (HOSPITAL_BASED_OUTPATIENT_CLINIC_OR_DEPARTMENT_OTHER): Payer: Self-pay

## 2024-07-29 ENCOUNTER — Ambulatory Visit: Payer: Self-pay

## 2024-07-29 NOTE — Telephone Encounter (Signed)
 FYI Only or Action Required?: Action required by provider: update on patient condition.  Patient was last seen in primary care on 07/21/2024 by Daryl Setter, NP.  Called Nurse Triage reporting Shortness of Breath.  Symptoms began yesterday.  Interventions attempted: Rest, hydration, or home remedies.  Symptoms are: gradually worsening.  Triage Disposition: Go to ED Now (Notify PCP)  Patient/caregiver understands and will follow disposition?: Yes, will follow disposition  Copied from CRM [id number]. Topic: Clinical - Red Word Triage >> Jul 29, 2024  4:15 PM Chiquita SQUIBB wrote: Red Word that prompted transfer to Nurse Triage: Patient is calling in stating he is having trouble breathing, patient states he may need more of his medication. Reason for Disposition  [1] MODERATE difficulty breathing (e.g., speaks in phrases, SOB even at rest, pulse 100-120) AND [2] NEW-onset or WORSE than normal  Answer Assessment - Initial Assessment Questions 1. RESPIRATORY STATUS: Describe your breathing? (e.g., wheezing, shortness of breath, unable to speak, severe coughing)      SOB at rest  Advised to go to ED, pt unsure if he will go to the ED pt educated on going to the ED. Pt has COPD, pt states that he goes to pulm tomorrow.  Protocols used: Breathing Difficulty-A-AH

## 2024-07-30 ENCOUNTER — Ambulatory Visit: Admitting: Family

## 2024-07-30 ENCOUNTER — Other Ambulatory Visit (HOSPITAL_BASED_OUTPATIENT_CLINIC_OR_DEPARTMENT_OTHER): Payer: Self-pay

## 2024-07-30 ENCOUNTER — Encounter (HOSPITAL_BASED_OUTPATIENT_CLINIC_OR_DEPARTMENT_OTHER): Payer: Self-pay | Admitting: Emergency Medicine

## 2024-07-30 ENCOUNTER — Other Ambulatory Visit: Payer: Self-pay

## 2024-07-30 ENCOUNTER — Emergency Department (HOSPITAL_BASED_OUTPATIENT_CLINIC_OR_DEPARTMENT_OTHER)

## 2024-07-30 ENCOUNTER — Other Ambulatory Visit: Payer: Self-pay | Admitting: Licensed Clinical Social Worker

## 2024-07-30 ENCOUNTER — Encounter: Payer: Self-pay | Admitting: Licensed Clinical Social Worker

## 2024-07-30 ENCOUNTER — Emergency Department (HOSPITAL_BASED_OUTPATIENT_CLINIC_OR_DEPARTMENT_OTHER)
Admission: EM | Admit: 2024-07-30 | Discharge: 2024-07-30 | Disposition: A | Discharge status: Home / Self Care | Attending: Emergency Medicine | Admitting: Emergency Medicine

## 2024-07-30 DIAGNOSIS — R600 Localized edema: Secondary | ICD-10-CM | POA: Insufficient documentation

## 2024-07-30 DIAGNOSIS — J441 Chronic obstructive pulmonary disease with (acute) exacerbation: Secondary | ICD-10-CM | POA: Insufficient documentation

## 2024-07-30 DIAGNOSIS — R059 Cough, unspecified: Secondary | ICD-10-CM | POA: Diagnosis not present

## 2024-07-30 DIAGNOSIS — I509 Heart failure, unspecified: Secondary | ICD-10-CM | POA: Insufficient documentation

## 2024-07-30 DIAGNOSIS — J929 Pleural plaque without asbestos: Secondary | ICD-10-CM | POA: Diagnosis not present

## 2024-07-30 DIAGNOSIS — Z7901 Long term (current) use of anticoagulants: Secondary | ICD-10-CM | POA: Diagnosis not present

## 2024-07-30 DIAGNOSIS — R918 Other nonspecific abnormal finding of lung field: Secondary | ICD-10-CM | POA: Diagnosis not present

## 2024-07-30 DIAGNOSIS — I700 Atherosclerosis of aorta: Secondary | ICD-10-CM | POA: Diagnosis not present

## 2024-07-30 DIAGNOSIS — R0602 Shortness of breath: Secondary | ICD-10-CM | POA: Diagnosis not present

## 2024-07-30 LAB — PRO BRAIN NATRIURETIC PEPTIDE: Pro Brain Natriuretic Peptide: 914 pg/mL — ABNORMAL HIGH (ref <300.0)

## 2024-07-30 LAB — HEPATIC FUNCTION PANEL
ALT: 7 U/L (ref 0–44)
AST: 11 U/L — ABNORMAL LOW (ref 15–41)
Albumin: 3.6 g/dL (ref 3.5–5.0)
Alkaline Phosphatase: 85 U/L (ref 38–126)
Bilirubin, Direct: 0.2 mg/dL (ref 0.0–0.2)
Indirect Bilirubin: 0.1 mg/dL — ABNORMAL LOW (ref 0.3–0.9)
Total Bilirubin: 0.3 mg/dL (ref 0.0–1.2)
Total Protein: 5.7 g/dL — ABNORMAL LOW (ref 6.5–8.1)

## 2024-07-30 LAB — BASIC METABOLIC PANEL WITH GFR
Anion gap: 9 (ref 5–15)
BUN: 18 mg/dL (ref 8–23)
CO2: 28 mmol/L (ref 22–32)
Calcium: 8.7 mg/dL — ABNORMAL LOW (ref 8.9–10.3)
Chloride: 100 mmol/L (ref 98–111)
Creatinine, Ser: 0.7 mg/dL (ref 0.61–1.24)
GFR, Estimated: >60 mL/min (ref >60)
Glucose, Bld: 118 mg/dL — ABNORMAL HIGH (ref 70–99)
Potassium: 4.2 mmol/L (ref 3.5–5.1)
Sodium: 138 mmol/L (ref 135–145)

## 2024-07-30 LAB — RESP PANEL BY RT-PCR (RSV, FLU A&B, COVID)  RVPGX2
Influenza A by PCR: NEGATIVE
Influenza B by PCR: NEGATIVE
Resp Syncytial Virus by PCR: NEGATIVE
SARS Coronavirus 2 by RT PCR: NEGATIVE

## 2024-07-30 LAB — CBC
HCT: 31.3 % — ABNORMAL LOW (ref 39.0–52.0)
Hemoglobin: 9.5 g/dL — ABNORMAL LOW (ref 13.0–17.0)
MCH: 27 pg (ref 26.0–34.0)
MCHC: 30.4 g/dL (ref 30.0–36.0)
MCV: 88.9 fL (ref 80.0–100.0)
Platelets: 363 K/uL (ref 150–400)
RBC: 3.52 MIL/uL — ABNORMAL LOW (ref 4.22–5.81)
RDW: 16.4 % — ABNORMAL HIGH (ref 11.5–15.5)
WBC: 8 K/uL (ref 4.0–10.5)
nRBC: 0 % (ref 0.0–0.2)

## 2024-07-30 LAB — TROPONIN T, HIGH SENSITIVITY: Troponin T High Sensitivity: 21 ng/L — ABNORMAL HIGH (ref 0–19)

## 2024-07-30 LAB — OCCULT BLOOD X 1 CARD TO LAB, STOOL: Fecal Occult Bld: NEGATIVE

## 2024-07-30 MED ORDER — DOXYCYCLINE HYCLATE 100 MG PO CAPS
100.0000 mg | ORAL_CAPSULE | Freq: Two times a day (BID) | ORAL | 0 refills | Status: DC
  Filled 2024-07-30: qty 14, 7d supply, fill #0

## 2024-07-30 MED ORDER — METHYLPREDNISOLONE SODIUM SUCC 125 MG IJ SOLR
125.0000 mg | Freq: Once | INTRAMUSCULAR | Status: AC
  Administered 2024-07-30: 125 mg via INTRAVENOUS
  Filled 2024-07-30: qty 2

## 2024-07-30 MED ORDER — FUROSEMIDE 10 MG/ML IJ SOLN
40.0000 mg | Freq: Once | INTRAMUSCULAR | Status: AC
  Administered 2024-07-30: 40 mg via INTRAVENOUS
  Filled 2024-07-30: qty 4

## 2024-07-30 MED ORDER — IPRATROPIUM-ALBUTEROL 0.5-2.5 (3) MG/3ML IN SOLN
3.0000 mL | RESPIRATORY_TRACT | Status: AC
  Administered 2024-07-30 (×3): 3 mL via RESPIRATORY_TRACT
  Filled 2024-07-30: qty 9

## 2024-07-30 MED ORDER — MAGNESIUM SULFATE 2 GM/50ML IV SOLN
2.0000 g | Freq: Once | INTRAVENOUS | Status: AC
  Administered 2024-07-30: 2 g via INTRAVENOUS
  Filled 2024-07-30: qty 50

## 2024-07-30 MED ORDER — PREDNISONE 20 MG PO TABS
ORAL_TABLET | ORAL | 0 refills | Status: DC
  Filled 2024-07-30: qty 8, 4d supply, fill #0

## 2024-07-30 NOTE — Telephone Encounter (Signed)
 Spoke with triage nurse yesterday and told them he had an appt with Pulmonary today.

## 2024-07-30 NOTE — Telephone Encounter (Signed)
 Copied from CRM #[id number]. Topic: Clinical - Request for Lab/Test Order >> Jul 30, 2024  2:12 PM Viola F wrote: Reason for CRM: Patient having shortness of breath, swelling in legs and feet - requested a x-ray. Spoke with nurse triage, refused ED

## 2024-07-30 NOTE — Patient Instructions (Signed)
 Robert Bates - I am sorry I was unable to reach you today for our scheduled appointment. I work with Daryl Setter, NP and am calling to support your healthcare needs. Please contact me at [phone] at your earliest convenience. I look forward to speaking with you soon.   Thank you,  Tobias [name] [name], PhD [facility], [facility] Social Worker Direct Dial: [phone]  Fax: [phone]

## 2024-07-30 NOTE — Discharge Instructions (Addendum)
 Please use your breathing medicine every 4 hours while awake for the next couple days.  Please return for worsening difficulty breathing or if you need to use your medication more often than that.  Your hemoglobin or the amount of blood in your body was lower than it has been.  Please call your family doctor to have this rechecked in a week or 2.  I did start you on antibiotics and steroids.  Please take them as prescribed.

## 2024-07-30 NOTE — ED Provider Notes (Signed)
 [place] [facility] Provider Note   CSN: [id number] Arrival date & time: 07/30/24  [id number]     Patient presents with: Shortness of Breath   Robert Bates is a 83 y.o. male.   83 yo M with a chief complaints of difficulty breathing.  Going on for a few days.  He called his family doctor yesterday and they encouraged him to come to the ED for evaluation.  He does not necessarily feel better or worse today.  He has been coughing a little bit.  Feels chest tightness at times.  He said significant leg swelling and he is wondering if he has too much fluid on him.  Feels little bit like his COPD as well.   Shortness of Breath      Prior to Admission medications   Medication Sig Start Date End Date Taking? Authorizing Provider  doxycycline  (VIBRAMYCIN ) 100 MG capsule Take 1 capsule (100 mg total) by mouth 2 (two) times daily. One po bid x 7 days 07/30/24  Yes Eyvette Cordon, DO  predniSONE  (DELTASONE ) 20 MG tablet 2 tabs po daily x 4 days 07/30/24  Yes Emil Share, DO  acetaminophen  (TYLENOL ) 500 MG tablet Take 1-2 tablets (500-1,000 mg total) by mouth every 6 (six) hours as needed. Patient taking differently: Take 500 mg by mouth every 12 (twelve) hours as needed. 05/16/21   Zimmerman, Donielle M, PA-C  amiodarone  (PACERONE ) 200 MG tablet Take 1 tablet (200 mg total) by mouth daily. 10/15/23   Nahser, Aleene PARAS, MD  atorvastatin  (LIPITOR ) 80 MG tablet Take 1 tablet (80 mg total) by mouth daily. Patient not taking: Reported on 07/21/2024 04/30/24   O'Sullivan, Melissa, NP  benzonatate  (TESSALON ) 100 MG capsule Take 1 capsule (100 mg total) by mouth 3 (three) times daily as needed. 06/16/24   O'Sullivan, Melissa, NP  budesonide -glycopyrrolate -formoterol  (BREZTRI  AEROSPHERE) 160-9-4.8 MCG/ACT AERO inhaler Inhale 2 puffs into the lungs 2 (two) times daily. Patient taking differently: Inhale 2 puffs into the lungs 2 (two) times daily as needed. 04/14/24   O'Sullivan,  Melissa, NP  cephALEXin  (KEFLEX ) 500 MG capsule Take 1 capsule (500 mg total) by mouth 4 (four) times daily. 07/27/24   Theotis Peers M, PA-C  diltiazem  (CARDIZEM  CD) 120 MG 24 hr capsule Take 1 capsule (120 mg total) by mouth daily. 01/28/24   O'Sullivan, Melissa, NP  furosemide  (LASIX ) 40 MG tablet Take 1 tablet (40 mg total) by mouth daily. 06/22/24   O'Sullivan, Melissa, NP  gabapentin  (NEURONTIN ) 100 MG capsule Take 1 capsule (100 mg total) by mouth 3 (three) times daily. 07/21/24   O'Sullivan, Melissa, NP  levalbuterol  (XOPENEX ) 0.63 MG/3ML nebulizer solution Take 3 mLs (0.63 mg total) by nebulization every 4 (four) hours as needed for wheezing or shortness of breath. 04/14/24   O'Sullivan, Melissa, NP  lisinopril  (ZESTRIL ) 2.5 MG tablet Take 1 tablet (2.5 mg total) by mouth daily. 06/26/24   O'Sullivan, Melissa, NP  omeprazole  (PRILOSEC) 40 MG capsule Take 1 capsule (40 mg total) by mouth daily. 01/28/24   O'Sullivan, Melissa, NP  ondansetron  (ZOFRAN ) 4 MG tablet Take 1 tablet (4 mg total) by mouth every 8 (eight) hours as needed for nausea or vomiting. 07/21/24   Daryl Setter, NP  potassium chloride  (KLOR-CON ) 10 MEQ tablet Take 10 mEq by mouth daily.    [provider]  rivaroxaban  (XARELTO ) 20 MG TABS tablet TAKE 1 TABLET BY MOUTH EVERY DAY 04/26/24   O'Sullivan, Melissa, NP  sertraline  (ZOLOFT ) 50  MG tablet Take 1 tablet (50 mg total) by mouth daily. Patient not taking: Reported on 07/21/2024 05/21/24   O'Sullivan, Melissa, NP  spironolactone  (ALDACTONE ) 25 MG tablet Take 1 tablet (25 mg total) by mouth daily. Patient taking differently: Take 1 tablet (25 mg total) by mouth daily. 06/26/24   O'Sullivan, Melissa, NP  tamsulosin  (FLOMAX ) 0.4 MG CAPS capsule Take 1 capsule (0.4 mg total) by mouth at bedtime. 06/11/24   O'Sullivan, Melissa, NP  traMADol  (ULTRAM ) 50 MG tablet Take 1 tablet (50 mg total) by mouth every 12 (twelve) hours as needed. 07/27/24 08/26/24  O'Sullivan, Melissa, NP     Allergies: Iodine, Iohexol , Metformin  and related, and Metformin     Review of Systems  Respiratory:  Positive for shortness of breath.     Updated Vital Signs BP 129/68   Pulse 71   Temp 97.7 F (36.5 C) (Oral)   Resp (!) 21   Ht 5' 9 (1.753 m)   Wt 78 kg   SpO2 91%   BMI 25.40 kg/m   Physical Exam Vitals and nursing note reviewed.  Constitutional:      Appearance: He is well-developed.  HENT:     Head: Normocephalic and atraumatic.  Eyes:     Pupils: Pupils are equal, round, and reactive to light.  Neck:     Vascular: No JVD.  Cardiovascular:     Rate and Rhythm: Normal rate and regular rhythm.     Heart sounds: No murmur heard.    No friction rub. No gallop.  Pulmonary:     Effort: No respiratory distress.     Breath sounds: Decreased breath sounds present. No wheezing.     Comments: Initial sounds in all fields with faint wheezes.  No obvious Rales. Abdominal:     General: There is no distension.     Tenderness: There is no abdominal tenderness. There is no guarding or rebound.     Comments: 3+ edema to bilateral lower extremities up to the thighs  Musculoskeletal:        General: Normal range of motion.     Cervical back: Normal range of motion and neck supple.     Right lower leg: Edema present.     Left lower leg: Edema present.  Skin:    Coloration: Skin is not pale.     Findings: No rash.  Neurological:     Mental Status: He is alert and oriented to person, place, and time.  Psychiatric:        Behavior: Behavior normal.     (all labs ordered are listed, but only abnormal results are displayed) Labs Reviewed  BASIC METABOLIC PANEL WITH GFR - Abnormal; Notable for the following components:      Result Value   Glucose, Bld 118 (*)    Calcium  8.7 (*)    All other components within normal limits  CBC - Abnormal; Notable for the following components:   RBC 3.52 (*)    Hemoglobin 9.5 (*)    HCT 31.3 (*)    RDW 16.4 (*)    All other  components within normal limits  HEPATIC FUNCTION PANEL - Abnormal; Notable for the following components:   Total Protein 5.7 (*)    AST 11 (*)    Indirect Bilirubin 0.1 (*)    All other components within normal limits  PRO BRAIN NATRIURETIC PEPTIDE - Abnormal; Notable for the following components:   Pro Brain Natriuretic Peptide 914.0 (*)    All other components  within normal limits  TROPONIN T, HIGH SENSITIVITY - Abnormal; Notable for the following components:   Troponin T High Sensitivity 21 (*)    All other components within normal limits  RESP PANEL BY RT-PCR (RSV, FLU A&B, COVID)  RVPGX2  OCCULT BLOOD X 1 CARD TO LAB, STOOL    EKG: EKG Interpretation Date/Time:  Friday July 30 2024 16:09:49 EDT Ventricular Rate:  66 PR Interval:  135 QRS Duration:  111 QT Interval:  440 QTC Calculation: 461 R Axis:   -26  Text Interpretation: Sinus rhythm Borderline left axis deviation Borderline low voltage, extremity leads Consider anterior infarct Otherwise no significant change Confirmed by Emil Share [phone]) on 07/30/2024 4:23:54 PM  Radiology: [facility] Chest Port 1 View Result Date: 07/30/2024 CLINICAL DATA:  sob EXAM: PORTABLE CHEST - 1 VIEW COMPARISON:  06/18/2024, 02/24/2024 FINDINGS: Increased interstitial opacities throughout both lungs. Similar left suprahilar consolidation, likely scarring. Moderate left pleural effusion is unchanged. Biapical pleural thickening. No pneumothorax. No cardiomegaly. Sternotomy wires and left atrial appendage occlusion clip. Tortuous aorta with aortic atherosclerosis. No acute fracture or destructive lesions. Multilevel thoracic osteophytosis. IMPRESSION: 1. Increased bilateral interstitial opacities, which may represent changes from emphysema, interstitial edema, or atypical/viral infection, in the correct clinical context. 2. Unchanged findings in the left lung. Electronically Signed   By: Rogelia Myers M.D.   On: 07/30/2024 16:45     Procedures    Medications Ordered in the ED  furosemide  (LASIX ) injection 40 mg (has no administration in time range)  ipratropium-albuterol  (DUONEB) 0.5-2.5 (3) MG/3ML nebulizer solution 3 mL (3 mLs Nebulization Given 07/30/24 1654)  methylPREDNISolone  sodium succinate (SOLU-MEDROL ) 125 mg/2 mL injection 125 mg (125 mg Intravenous Given 07/30/24 1656)  magnesium  sulfate IVPB 2 g 50 mL (2 g Intravenous New Bag/Given 07/30/24 1706)                                    Medical Decision Making Amount and/or Complexity of Data Reviewed Labs: ordered. Radiology: ordered.  Risk Prescription drug management.   83 yo M with a chief complaint of difficulty breathing.  Potentially multifactorial, history of CHF as well as COPD.  Diminished breath sounds in all fields faint wheezes will give 3 DuoNebs back-to-back steroids and magnesium .  Significant lower extremity edema as well.  No obvious rales on exam.  Likely give a dose of Lasix  post lab work.  Chest x-ray.  COVID flu and RSV.  Reassess.  Patient with a 3 g hemoglobin drop over the past year.  He denies any dark stool or blood in his stool.  Rectal exam here without obvious stool in the vault.  Will send off for Hemoccult.  No significant electrolyte abnormalities renal function at baseline.  BNP and troponin very mildly elevated.  Chest x-ray independently interpreted by me with a persistent left lower lobe opacity.  Radiology read as possible heart failure versus pneumonia versus atypical infection.  Patient reassessed and feeling much better after 3 DuoNebs.  I offered admission.  He would like to try and go home.  Will ambulate here with pulse oximeter.  Patient was able to ambulate around the department with oxygen  saturations to 87 and above.  He says he continues to feel better and would like to try and go home.  Discussed strict return precautions with him.  Will have him follow-up with his family doctor.  6:01 PM:  I have discussed the  diagnosis/risks/treatment options with the patient.  Evaluation and diagnostic testing in the emergency department does not suggest an emergent condition requiring admission or immediate intervention beyond what has been performed at this time.  They will follow up with PCP. We also discussed returning to the ED immediately if new or worsening sx occur. We discussed the sx which are most concerning (e.g., sudden worsening pain, fever, inability to tolerate by mouth, sob, need to use inhaler more often than every 4 hours) that necessitate immediate return. Medications administered to the patient during their visit and any new prescriptions provided to the patient are listed below.  Medications given during this visit Medications  furosemide  (LASIX ) injection 40 mg (has no administration in time range)  ipratropium-albuterol  (DUONEB) 0.5-2.5 (3) MG/3ML nebulizer solution 3 mL (3 mLs Nebulization Given 07/30/24 1654)  methylPREDNISolone  sodium succinate (SOLU-MEDROL ) 125 mg/2 mL injection 125 mg (125 mg Intravenous Given 07/30/24 1656)  magnesium  sulfate IVPB 2 g 50 mL (2 g Intravenous New Bag/Given 07/30/24 1706)     The patient appears reasonably screen and/or stabilized for discharge and I doubt any other medical condition or other [facility] requiring further screening, evaluation, or treatment in the ED at this time prior to discharge.       Final diagnoses:  COPD exacerbation [facility])    ED Discharge Orders          Ordered    doxycycline  (VIBRAMYCIN ) 100 MG capsule  2 times daily        07/30/24 1800    predniSONE  (DELTASONE ) 20 MG tablet        07/30/24 1800               Emil Share, DO 07/30/24 1801

## 2024-07-30 NOTE — ED Notes (Signed)
 Ambulated on room air HR 78, SpO2 89, RR 24 at start. Ambulated approximately  400 feet, HR 78-85, SpO2 88-94%, RR 38 .

## 2024-07-30 NOTE — Telephone Encounter (Signed)
 Patient presented to waiting room today around 3:45 PM without appointment.  Pt reports increased shortness of breath and increased leg swelling. Pt was advised per CMA to proceed downstairs to the ED for further evaluation.

## 2024-07-30 NOTE — ED Triage Notes (Signed)
 Pt reports BLLE swelling, shob x 2 days.  Denies cough and fever.  Denies smoking, reports occasional vaping.  RT to triage to assess.

## 2024-08-04 ENCOUNTER — Other Ambulatory Visit (INDEPENDENT_AMBULATORY_CARE_PROVIDER_SITE_OTHER): Admitting: Pharmacist

## 2024-08-04 DIAGNOSIS — I4819 Other persistent atrial fibrillation: Secondary | ICD-10-CM

## 2024-08-04 DIAGNOSIS — Z79899 Other long term (current) drug therapy: Secondary | ICD-10-CM

## 2024-08-04 NOTE — Progress Notes (Signed)
 08/04/2024 Name: Maricela Kawahara MRN: [id number] DOB: 18-Jun-1941  Chief Complaint  Patient presents with   Medication Management    Danta Baumgardner is a 83 y.o. year old male who presented for an office visit.   They were referred to the pharmacist by their PCP for assistance in managing complex medication management.    Subjective:  Medication Access/Adherence  Since the last time I met with patient, he has been seen twice in the ED.   9/23 ED Visit for cellulitis - prescribed cephalexin  500mg  4 times a day for 5 days.  9/26 - ED visit for COPD exacerbation - HE NEED TO USE INHALER EVERY DAY Prescribed doxycycline  100mg  2 times a day for 7 days and prednisone  20mg  - take 2 tabs = 40mg  daily for 4 days.   Today he reports that he is still having difficulty breathing from time to time and he is not sure which antibiotic he is supposed to take. He was told by his niece that she should not take both doxycycline  and cephalexin .   Patient also mentions that he call EMS yesterday due to shortness of breath but he declined to be taken to the hospital. They gave him a nebulizer treatment and Oxygen  saturation improved per patient.   It appears patient has not taken prednisone  or doxycycline  as prescribed on 9/26 because he has 6 tablets of prednisone  left and about 10 doxycyline.   Patient endorses that he has been taking furosemide  40mg  every day and even took and extra tablet 1 days when he was shortness of breath.    Current Pharmacy:  [facility] [place] - [facility] [address], [address] [place] [address] Phone: [phone] Fax: [phone]  [facility] [place] [place] - [address] [place] [place] [address] Phone: [phone] Fax: [phone]  [facility] [address], [place] - [address] [place] [address] Phone: [phone] Fax: [phone]  [facility] [address], [place] - [address] [place] [address] Phone: [phone] Fax: [phone]     Objective:  Lab Results  Component Value Date   HGBA1C 6.2 (H) 06/18/2024    Lab Results  Component Value Date   CREATININE 0.70 07/30/2024   BUN 18 07/30/2024   NA 138 07/30/2024   K 4.2 07/30/2024   CL 100 07/30/2024   CO2 28 07/30/2024    Lab Results  Component Value Date   CHOL 142 08/07/2023   HDL 79 08/07/2023   LDLCALC 47 08/07/2023   LDLDIRECT 142.2 12/26/2008   TRIG 81 08/07/2023   CHOLHDL 1.8 08/07/2023      Assessment/Plan:   Medication Management: - Reviewed current medication list with patient  - Recommended that he take prednisone  20mg  = 2 tabs today as soon as we get of the phone and he eats a meal. He is to take 2 tabs daily until there is no prednisone  left .  - Recommended that he restart doxycycline  - take 1 tablet 2 times a day. He should start this ASAP and continue to take until there are no doxycycline  tablets left.   - Reminded him to use Breztri  inhaler 2 puffs into lungs twice a day. Will [place] Breztri  [place].  - Continue to use albuterol  / Xopenex  nebules as needed up to every 4 hours as needed for shortness of  breath / wheezing.   - If he feels symptoms/ breathing worsens, instructed him to call 911 or go to the ED. If he does not feel symptoms have improved in 2 days, instructed him to call PCP office.   Follow Up Plan: 1 week   Madelin Ray, PharmD Clinical Pharmacist [facility]

## 2024-08-05 ENCOUNTER — Telehealth: Payer: Self-pay

## 2024-08-05 DIAGNOSIS — Z59819 Housing instability, housed unspecified: Secondary | ICD-10-CM

## 2024-08-05 NOTE — Telephone Encounter (Signed)
 Referral has been placed.

## 2024-08-05 NOTE — Telephone Encounter (Signed)
 Patient called to see if he can get a referral so social worker or someone that can help him with housing.  He is out of  a place to stay again and is running out of resources.

## 2024-08-06 ENCOUNTER — Other Ambulatory Visit (HOSPITAL_BASED_OUTPATIENT_CLINIC_OR_DEPARTMENT_OTHER): Payer: Self-pay

## 2024-08-06 ENCOUNTER — Telehealth: Payer: Self-pay

## 2024-08-06 ENCOUNTER — Other Ambulatory Visit: Payer: Self-pay

## 2024-08-06 NOTE — Telephone Encounter (Signed)
 Another fax number to use for PAP paperwork   [phone]

## 2024-08-06 NOTE — Telephone Encounter (Signed)
 Problem list , allergies, medication list printed for [place] Patient assistance program   Faxed to [phone]

## 2024-08-09 ENCOUNTER — Ambulatory Visit: Payer: Self-pay

## 2024-08-09 ENCOUNTER — Other Ambulatory Visit: Payer: Self-pay

## 2024-08-09 NOTE — Telephone Encounter (Signed)
 Patient called reporting bilateral leg swelling to legs. Patient reports being on medication to help remove fluid but states medication hasn't been helping. Scheduled for acute appointment tomorrow at 4 PM.   FYI Only or Action Required?: FYI only for provider.  Patient was last seen in primary care on 07/21/2024 by Daryl Setter, NP.  Called Nurse Triage reporting Leg Swelling.  Symptoms began several days ago.  Interventions attempted: Prescription medications: Lasix  and Rest, hydration, or home remedies.  Symptoms are: unchanged.  Triage Disposition: See Physician Within 24 Hours  Patient/caregiver understands and will follow disposition?: Yes  Copied from CRM #[id number]. Topic: Clinical - Medical Advice >> Aug 09, 2024 10:35 AM Mercedes MATSU wrote: Reason for CRM: Patient called his social worker ([employer]) and he told her that he has swelling in his legs and his ankles. He told her he is on a fluid medication, that usually helps but he said it has not. He asked her to call the doctor for him. Patient is requesting a call back at [phone]. Reason for Disposition  [1] MODERATE leg swelling (e.g., swelling extends up to knees) AND [2] new-onset or getting worse  Answer Assessment - Initial Assessment Questions 1. ONSET: When did the swelling start? (e.g., minutes, hours, days)     Started last week 2. LOCATION: What part of the leg is swollen?  Are both legs swollen or just one leg?     Both legs-but right leg is more swollen than left leg.  3. SEVERITY: How bad is the swelling? (e.g., localized; mild, moderate, severe)     Moderate swelling 4. REDNESS: Is there redness or signs of infection?     no 5. PAIN: Is the swelling painful to touch? If Yes, ask: How painful is it?   (Scale 1-10; mild, moderate or severe)     no 6. FEVER: Do you have a fever? If Yes, ask: What is it, how was it measured, and when did it start?      no 7. CAUSE: What do you  think is causing the leg swelling?     unsure 8. MEDICAL HISTORY: Do you have a history of blood clots (e.g., DVT), cancer, heart failure, kidney disease, or liver failure?     yes 9. RECURRENT SYMPTOM: Have you had leg swelling before? If Yes, ask: When was the last time? What happened that time?     yes 10. OTHER SYMPTOMS: Do you have any other symptoms? (e.g., chest pain, difficulty breathing)       Difficulty breathing last week.  Protocols used: Leg Swelling and Edema-A-AH

## 2024-08-09 NOTE — Patient Outreach (Signed)
 LCSW called patient's PCP to inform them of patient's swelling. LCSW spoke with [place] and she took down information and stated that she would relay it to the nurse so they can give him a call.  LCSW called patient again and let him know that the PCP office would be calling him today as well as BSW Carla.  Olam Ally, MSW, LCSW [place]  [facility], [facility] Licensed Clinical Social Worker Direct Dial: [phone]

## 2024-08-09 NOTE — Patient Outreach (Signed)
 Complex Care Management   Visit Note  08/09/2024  Name:  Robert Bates MRN: [id number] DOB: 02/02/1941  Situation: Referral received for Complex Care Management related to Mental/Behavioral Health diagnosis mild anxiety and SDOH Barriers:  Housing . I obtained verbal consent from Patient.  Visit completed with patient  on the phone. Patient reports now in a hotel and needing more assistance. LCSW consulted with BSW Carla and she plans to follow up with patient today. 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 Rockey [place].    Peripheral vascular disease    Abdominal Aortic Aneursym   Pneumonia    as a child   Radiation 09/18/15-10/25/15   left lower lobe 70.2 Gy   Seizures (HCC) 02/18/2020   Tobacco abuse     Assessment: Patient Reported Symptoms:  Cognitive Cognitive Status: Alert and oriented to person, place, and time, Normal speech and language skills Cognitive/Intellectual Conditions Management [RPT]: None reported or documented in medical history or problem list      Neurological Neurological Review of Symptoms: No  symptoms reported    HEENT HEENT Symptoms Reported: No symptoms reported      Cardiovascular Cardiovascular Symptoms Reported: No symptoms reported Does patient have uncontrolled Hypertension?: No Cardiovascular Comment: patient reports having open heart surgery  Respiratory Respiratory Symptoms Reported: No symptoms reported    Endocrine Endocrine Symptoms Reported: Other Other symptoms related to hypoglycemia or hyperglycemia: Swelling in your ankles and legs Is patient diabetic?: Yes Is patient checking blood sugars at home?: No    Gastrointestinal Gastrointestinal Symptoms Reported: No symptoms reported      Genitourinary Genitourinary Symptoms Reported: No symptoms reported    Integumentary Integumentary Symptoms Reported: No symptoms reported    Musculoskeletal Musculoskelatal Symptoms Reviewed: Back pain Additional Musculoskeletal Details: patient reports that he still needs to get the surgery and now patient is reporting that they are saying he is high risk Musculoskeletal Management Strategies: Medical device, Medication therapy Falls in the past year?: Yes Number of falls in past year: 2 or more Was there an injury with Fall?: No Fall Risk Category Calculator: 2 Patient Fall Risk Level: Moderate Fall Risk Patient at Risk for Falls Due to: History of fall(s) Fall risk Follow up: Falls evaluation completed  Psychosocial Psychosocial Symptoms Reported: Anxiety - if selected complete GAD Additional Psychological Details: Patient reports that it is higher because son had an accident Behavioral Management Strategies: Adequate rest, Medication therapy Behavioral Health Comment: Patient is still declining counseling- wants to get back surgery  Techniques to Cope with Loss/Stress/Change: Medication Quality of Family Relationships: stressful, supportive Do you feel physically threatened by others?: No    08/09/2024    PHQ2-9 Depression Screening   Little interest or pleasure  in doing things    Feeling down, depressed, or hopeless    PHQ-2 - Total Score    Trouble falling or staying asleep, or sleeping too much    Feeling tired or having little energy    Poor appetite or overeating     Feeling bad about yourself - or that you are a failure or have let yourself or your family down    Trouble concentrating on things, such as reading the newspaper or watching television    Moving or speaking so slowly that other people could have noticed.  Or the opposite - being so fidgety or restless that you have been moving around a lot more than usual    Thoughts that you would be better off dead, or hurting yourself in some way    PHQ2-9 Total Score    If you checked off any problems, how difficult have these problems made it for you to do your work, take care of things at home, or get along with other people    Depression Interventions/Treatment      There were no vitals filed for this visit.  Medications Reviewed Today     Reviewed by Sherren Olam FORBES KEN (Social Worker) on 08/09/24 at 0919  Med List Status: <None>   Medication Order Taking? Sig Documenting Provider Last Dose Status Informant  acetaminophen  (TYLENOL ) 500 MG tablet [id number]  Take 1-2 tablets (500-1,000 mg total) by mouth every 6 (six) hours as needed.  Patient taking differently: Take 500 mg by mouth every 12 (twelve) hours as needed.   Dwan Kyla HERO, PA-C  Active Self           Med Note JUSTINO, TAMMY B   Wed Jul 16, 2023 11:25 AM)    amiodarone  (PACERONE ) 200 MG tablet [id number]  Take 1 tablet (200 mg total) by mouth daily. Nahser, Aleene PARAS, MD  Active Self  atorvastatin  (LIPITOR ) 80 MG tablet [id number]  Take 1 tablet (80 mg total) by mouth daily.  Patient not taking: Reported on 07/21/2024   Daryl Setter, NP  Active Self  benzonatate  (TESSALON ) 100 MG capsule [id number]  Take 1 capsule (100 mg total) by mouth 3 (three) times daily as needed. Daryl Setter, NP  Active    budesonide -glycopyrrolate -formoterol  (BREZTRI  AEROSPHERE) 160-9-4.8 MCG/ACT AERO inhaler [id number]  Inhale 2 puffs into the lungs 2 (two) times daily. Daryl Setter, NP  Active Self  cephALEXin  (KEFLEX ) 500 MG capsule [id number]  Take 1 capsule (500 mg total) by mouth 4 (four) times daily.  Patient not taking: Reported on 08/04/2024   Theotis Cameron HERO, PA-C  Active   diltiazem  (CARDIZEM  CD) 120 MG 24 hr capsule [id number]  Take 1 capsule (120 mg total) by mouth daily. Daryl Setter, NP  Active Self  doxycycline  (VIBRAMYCIN ) 100 MG capsule [id number]  Take 1 capsule (100 mg total) by mouth 2 (two) times daily for 7 days.  Patient not taking: Reported on 08/04/2024   Emil Share, DO  Active   furosemide  (LASIX ) 40 MG tablet [id number]  Take 1 tablet (40 mg total) by mouth daily. Daryl Setter, NP  Active   gabapentin  (NEURONTIN ) 100 MG capsule [id number]  Take 1 capsule (100 mg total) by mouth 3 (three) times daily. Daryl Setter, NP  Active  levalbuterol  (XOPENEX ) 0.63 MG/3ML nebulizer solution [id number]  Take 3 mLs (0.63 mg total) by nebulization every 4 (four) hours as needed for wheezing or shortness of breath. Daryl Setter, NP  Active Self  lisinopril  (ZESTRIL ) 2.5 MG tablet [id number]  Take 1 tablet (2.5 mg total) by mouth daily. Daryl Setter, NP  Active   omeprazole  (PRILOSEC) 40 MG capsule [id number]  Take 1 capsule (40 mg total) by mouth daily. Daryl Setter, NP  Active Self  ondansetron  (ZOFRAN ) 4 MG tablet [id number]  Take 1 tablet (4 mg total) by mouth every 8 (eight) hours as needed for nausea or vomiting. Daryl Setter, NP  Active   potassium chloride  (KLOR-CON ) 10 MEQ tablet [id number]  Take 10 mEq by mouth daily. [provider]  Active   predniSONE  (DELTASONE ) 20 MG tablet [id number]  Take 2 tablets (40mg ) by mouth daily for 4 days.  Patient not taking: Reported on 08/04/2024   Emil Share, DO  Active   rivaroxaban  (XARELTO ) 20 MG TABS  tablet [id number]  TAKE 1 TABLET BY MOUTH EVERY DAY Daryl Setter, NP  Active Self  sertraline  (ZOLOFT ) 50 MG tablet [id number]  Take 1 tablet (50 mg total) by mouth daily.  Patient not taking: Reported on 07/21/2024   Daryl Setter, NP  Active Self  spironolactone  (ALDACTONE ) 25 MG tablet [id number]  Take 1 tablet (25 mg total) by mouth daily. Daryl Setter, NP  Active   tamsulosin  (FLOMAX ) 0.4 MG CAPS capsule [id number]  Take 1 capsule (0.4 mg total) by mouth at bedtime. Daryl Setter, NP  Active   traMADol  (ULTRAM ) 50 MG tablet [id number]  Take 1 tablet (50 mg total) by mouth every 12 (twelve) hours as needed. Daryl Setter, NP  Active   Med List Note Deleta Debby SAUNDERS, [facility] 07/19/24 1335): Meds handled by Erminio Merritts, niece, [phone]               Recommendation:   PCP Follow-up Continue Current Plan of Care  Follow Up Plan:    08/16/2024 at 10:00 am  Olam Ally, MSW, [employer] [place]  [facility], [facility] Licensed Clinical Social Worker Direct Dial: [phone]

## 2024-08-09 NOTE — Patient Instructions (Signed)
 Visit Information  Thank you for taking time to visit with me today. Please don't hesitate to contact me if I can be of assistance to you before our next scheduled appointment.  Your next care management appointment is by telephone on 08/16/2024 at 10:00 am  Please call the care guide team at [phone] if you need to cancel, schedule, or reschedule an appointment.   Please call 911 if you are experiencing a Mental Health or Behavioral Health Crisis or need someone to talk to.  Olam Ally, MSW, LCSW [place]  [facility], [facility] Licensed Clinical Social Worker Direct Dial: [phone]

## 2024-08-10 ENCOUNTER — Ambulatory Visit: Admitting: Family

## 2024-08-10 ENCOUNTER — Other Ambulatory Visit: Payer: Self-pay | Admitting: Pharmacist

## 2024-08-10 NOTE — Progress Notes (Signed)
   08/10/2024 Name: Robert Bates MRN: [id number] DOB: Apr 30, 1941  No chief complaint on file.   Robert Bates is a 83 y.o. year old male who presented for a phone visit   They were referred to the pharmacist by their PCP for assistance in managing complex medication management.    Subjective:  Medication Access/Adherence 9/23 ED Visit for cellulitis - prescribed cephalexin  500mg  4 times a day for 5 days.  9/26 - ED visit for COPD exacerbation - HE NEED TO USE INHALER EVERY DAY Prescribed doxycycline  100mg  2 times a day for 7 days and prednisone  20mg  - take 2 tabs = 40mg  daily for 4 days.  Completed antibiotic and steroid therapy per patient.   He has reported increased edema in both legs. He will see PCP today at 4pm.  Patient endorses that he has been taking furosemide  40mg  every day.   Reviewed medication list. Patient report he has ordered refills for diltiazem  and amiodarone . He has all his medications but will need Breztri  inhaler soon. [place] - called today and Breztri  application is in process.  Farxiga  was shipped to him and delivered 08/06/2024  but it was sent to his old address and patient is not able to check to see if it is there. He does not know who lives at that address now.    Current Pharmacy:  [facility] [place] [facility] [address] [place] [address] Phone: [phone] Fax: [phone]  [facility] - [place], [place] - [address] [place] [address] Phone: [phone] Fax: [phone]  [facility] [address] [place] - [address] [place] [address] Phone: [phone] Fax: [phone]  [facility] [address], [place] - [address] [place] [address] Phone: [phone] Fax: [phone]     Objective:  Lab Results  Component Value  Date   HGBA1C 6.2 (H) 06/18/2024    Lab Results  Component Value Date   CREATININE 0.70 07/30/2024   BUN 18 07/30/2024   NA 138 07/30/2024   K 4.2 07/30/2024   CL 100 07/30/2024   CO2 28 07/30/2024    Lab Results  Component Value Date   CHOL 142 08/07/2023   HDL 79 08/07/2023   LDLCALC 47 08/07/2023   LDLDIRECT 142.2 12/26/2008   TRIG 81 08/07/2023   CHOLHDL 1.8 08/07/2023      Assessment/Plan:   Medication Management: - Reviewed current medication list with patient  - Reminded him to use Breztri  inhaler 2 puffs into lungs twice a day. Checked on application for Breztri  [place]ru [place] - it is in process.  - Spoke with [place] Representative about Farxiga  shipment. They updated his address to [address] / [place] which is his sister in law's address since patient's living situation is unstable at this time. He is working with Child psychotherapist on housing options. [place] a replacement shipment of Farxiga  out today. Patient should received in 3 to 5 days.  - Continue to use albuterol  / Xopenex  nebules as needed up to every 4 hours as needed for shortness of breath / wheezing.    Follow Up Plan: 1 to 2 weeks.    Robert Bates, PharmD Clinical Pharmacist [facility]

## 2024-08-10 NOTE — Progress Notes (Unsigned)
   Acute Office Visit  Subjective:     Patient ID: Robert Bates, male    DOB: Dec 03, 1940, 83 y.o.   MRN: [id number]  No chief complaint on file.   HPI Patient is in today for acute visit.  Past medical history-COPD, CHF , malignant pleural effusion, PVD, CAD s/p post CABG, atrial fibrillation, hypertension,   Since CABG, he has had issues with recurrent pleural effusions since 2022.   *check weights  COPD- Follows with pulmonology, on Breztri  ?? Why aren't we following up with PULM?? Recent COPD exacerbation per ED note on 07/30/2024.  CHF- Furosemide  40 mh daily  Bilateral leg swelling and shortness of breath.   Swelling is improved in legs and weight down several pounds. Continue furosemide  40 mg daily.  I suspect ongoing LLL pleural effusion- will check cxr to make sure it has not enlarged given his c/o SOB.   ROS      Objective:    There were no vitals taken for this visit. {Vitals History (Optional):23777}  Physical Exam  No results found for any visits on 08/12/24.      Assessment & Plan:   Problem List Items Addressed This Visit   None   No orders of the defined types were placed in this encounter.   No follow-ups on file.  Kamden Reber L Jadien Lehigh, NP

## 2024-08-12 ENCOUNTER — Encounter: Payer: Self-pay | Admitting: Student

## 2024-08-12 ENCOUNTER — Other Ambulatory Visit (HOSPITAL_BASED_OUTPATIENT_CLINIC_OR_DEPARTMENT_OTHER): Payer: Self-pay

## 2024-08-12 ENCOUNTER — Ambulatory Visit: Payer: Self-pay

## 2024-08-12 ENCOUNTER — Other Ambulatory Visit: Payer: Self-pay | Admitting: Licensed Clinical Social Worker

## 2024-08-12 ENCOUNTER — Ambulatory Visit (INDEPENDENT_AMBULATORY_CARE_PROVIDER_SITE_OTHER): Admitting: Student

## 2024-08-12 DIAGNOSIS — Z91199 Patient's noncompliance with other medical treatment and regimen due to unspecified reason: Secondary | ICD-10-CM

## 2024-08-12 DIAGNOSIS — R0602 Shortness of breath: Secondary | ICD-10-CM

## 2024-08-12 DIAGNOSIS — I5032 Chronic diastolic (congestive) heart failure: Secondary | ICD-10-CM

## 2024-08-12 DIAGNOSIS — J90 Pleural effusion, not elsewhere classified: Secondary | ICD-10-CM

## 2024-08-12 NOTE — Progress Notes (Signed)
 Error.  Pt no show

## 2024-08-12 NOTE — Telephone Encounter (Signed)
 Copied from CRM [id number]. Topic: Clinical - Red Word Triage >> Aug 12, 2024  1:18 PM [place] A wrote: [facility] that prompted transfer to Nurse Triage: Pt is calling wanting to cancel appt for today; said swelling has gone down Reason for Disposition  [1] Follow-up call to recent contact AND [2] information only call, no triage required  Answer Assessment - Initial Assessment Questions 1. REASON FOR CALL: What is the main reason for your call? or How can I best help you?     Patient requesting to cancel his appointment today, transferred from [facility] due to appointment was booked by nurse triage.  2. SYMPTOMS : Do you have any symptoms?      Patient states the swelling in his legs has resolved. RN advised patient to call back for any new or worsening symptoms (especially swelling, chest pain, SOB).  3. OTHER QUESTIONS: Do you have any other questions?     Patient also states he has a new phone number and needs help getting his mychart set up. RN provided patient with [employer] service desk number.  Protocols used: Information Only Call - No Triage-A-AH

## 2024-08-13 ENCOUNTER — Emergency Department (HOSPITAL_BASED_OUTPATIENT_CLINIC_OR_DEPARTMENT_OTHER)
Admission: EM | Admit: 2024-08-13 | Discharge: 2024-08-13 | Disposition: A | Discharge status: Home / Self Care | Attending: Emergency Medicine | Admitting: Emergency Medicine

## 2024-08-13 ENCOUNTER — Encounter (HOSPITAL_BASED_OUTPATIENT_CLINIC_OR_DEPARTMENT_OTHER): Payer: Self-pay

## 2024-08-13 ENCOUNTER — Other Ambulatory Visit (HOSPITAL_BASED_OUTPATIENT_CLINIC_OR_DEPARTMENT_OTHER): Payer: Self-pay

## 2024-08-13 ENCOUNTER — Other Ambulatory Visit: Payer: Self-pay

## 2024-08-13 ENCOUNTER — Emergency Department (HOSPITAL_BASED_OUTPATIENT_CLINIC_OR_DEPARTMENT_OTHER)

## 2024-08-13 ENCOUNTER — Telehealth: Payer: Self-pay

## 2024-08-13 DIAGNOSIS — R600 Localized edema: Secondary | ICD-10-CM | POA: Diagnosis not present

## 2024-08-13 DIAGNOSIS — Z7901 Long term (current) use of anticoagulants: Secondary | ICD-10-CM | POA: Insufficient documentation

## 2024-08-13 DIAGNOSIS — M25562 Pain in left knee: Secondary | ICD-10-CM | POA: Diagnosis not present

## 2024-08-13 MED ORDER — KETOROLAC TROMETHAMINE 15 MG/ML IJ SOLN
15.0000 mg | Freq: Once | INTRAMUSCULAR | Status: AC
  Administered 2024-08-13: 15 mg via INTRAMUSCULAR
  Filled 2024-08-13: qty 1

## 2024-08-13 MED ORDER — ACETAMINOPHEN 500 MG PO TABS
1000.0000 mg | ORAL_TABLET | Freq: Once | ORAL | Status: AC
  Administered 2024-08-13: 1000 mg via ORAL
  Filled 2024-08-13: qty 2

## 2024-08-13 MED ORDER — DICLOFENAC SODIUM 1 % EX GEL
4.0000 g | Freq: Four times a day (QID) | CUTANEOUS | 0 refills | Status: DC
  Filled 2024-08-13: qty 100, 7d supply, fill #0

## 2024-08-13 NOTE — ED Provider Notes (Signed)
  EMERG[facility]Y [facility]MENT [facility] [place] Provider Note   CSN: [id number] Arrival date & time: 08/13/24  1516     Patient presents with: Leg Pain   Robert Bates is a 83 y.o. male.   83 yo M with a chief complaint of left knee pain.  Patient denies any injury.  States that he had had significant swelling to both of his legs which actually had improved on the left side and then noticed the pain.  Going on for about 24 hours.  Pain mostly to the posterior aspect of the knee.  He has more pain with movement and trying to bear weight.   Leg Pain      Prior to Admission medications   Medication Sig Start Date End Date Taking? Authorizing Provider  diclofenac Sodium (VOLTAREN) 1 % GEL Apply 4 g topically 4 (four) times daily. 08/13/24  Yes Emil Share, DO  acetaminophen  (TYLENOL ) 500 MG tablet Take 1-2 tablets (500-1,000 mg total) by mouth every 6 (six) hours as needed. Patient taking differently: Take 500 mg by mouth every 12 (twelve) hours as needed. 05/16/21   Zimmerman, Donielle M, PA-C  amiodarone  (PACERONE ) 200 MG tablet Take 1 tablet (200 mg total) by mouth daily. 10/15/23   Nahser, Aleene PARAS, MD  atorvastatin  (LIPITOR ) 80 MG tablet Take 1 tablet (80 mg total) by mouth daily. 04/30/24   O'Sullivan, Melissa, NP  benzonatate  (TESSALON ) 100 MG capsule Take 1 capsule (100 mg total) by mouth 3 (three) times daily as needed. 06/16/24   O'Sullivan, Melissa, NP  budesonide -glycopyrrolate -formoterol  (BREZTRI  AEROSPHERE) 160-9-4.8 MCG/ACT AERO inhaler Inhale 2 puffs into the lungs 2 (two) times daily. 04/14/24   O'Sullivan, Melissa, NP  diltiazem  (CARDIZEM  CD) 120 MG 24 hr capsule Take 1 capsule (120 mg total) by mouth daily. 01/28/24   O'Sullivan, Melissa, NP  doxycycline  (VIBRAMYCIN ) 100 MG capsule Take 1 capsule (100 mg total) by mouth 2 (two) times daily for 7 days. 07/30/24   Emil Share, DO  furosemide  (LASIX ) 40 MG tablet Take 1 tablet (40 mg total) by mouth daily. 06/22/24    O'Sullivan, Melissa, NP  gabapentin  (NEURONTIN ) 100 MG capsule Take 1 capsule (100 mg total) by mouth 3 (three) times daily. 07/21/24   O'Sullivan, Melissa, NP  levalbuterol  (XOPENEX ) 0.63 MG/3ML nebulizer solution Take 3 mLs (0.63 mg total) by nebulization every 4 (four) hours as needed for wheezing or shortness of breath. 04/14/24   O'Sullivan, Melissa, NP  lisinopril  (ZESTRIL ) 2.5 MG tablet Take 1 tablet (2.5 mg total) by mouth daily. 06/26/24   O'Sullivan, Melissa, NP  omeprazole  (PRILOSEC) 40 MG capsule Take 1 capsule (40 mg total) by mouth daily. 01/28/24   O'Sullivan, Melissa, NP  ondansetron  (ZOFRAN ) 4 MG tablet Take 1 tablet (4 mg total) by mouth every 8 (eight) hours as needed for nausea or vomiting. 07/21/24   Daryl Setter, NP  potassium chloride  (KLOR-CON ) 10 MEQ tablet Take 10 mEq by mouth daily.    [provider]  rivaroxaban  (XARELTO ) 20 MG TABS tablet TAKE 1 TABLET BY MOUTH EVERY DAY 04/26/24   O'Sullivan, Melissa, NP  sertraline  (ZOLOFT ) 50 MG tablet Take 1 tablet (50 mg total) by mouth daily. 05/21/24   O'Sullivan, Melissa, NP  spironolactone  (ALDACTONE ) 25 MG tablet Take 1 tablet (25 mg total) by mouth daily. 06/26/24   O'Sullivan, Melissa, NP  tamsulosin  (FLOMAX ) 0.4 MG CAPS capsule Take 1 capsule (0.4 mg total) by mouth at bedtime. 06/11/24   O'Sullivan, Melissa, NP  traMADol  (ULTRAM )  50 MG tablet Take 1 tablet (50 mg total) by mouth every 12 (twelve) hours as needed. 07/27/24 08/26/24  O'Sullivan, Melissa, NP    Allergies: Iodine, Iohexol , Metformin  and related, and Metformin     Review of Systems  Updated Vital Signs BP 118/65 (BP Location: Left Arm)   Pulse 60   Temp (!) 97.1 F (36.2 C)   Resp 20   Ht 5' 9 (1.753 m)   Wt 78 kg   SpO2 100%   BMI 25.40 kg/m   Physical Exam Vitals and nursing note reviewed.  Constitutional:      Appearance: He is well-developed.  HENT:     Head: Normocephalic and atraumatic.  Eyes:     Pupils: Pupils are equal, round,  and reactive to light.  Neck:     Vascular: No JVD.  Cardiovascular:     Rate and Rhythm: Normal rate and regular rhythm.     Heart sounds: No murmur heard.    No friction rub. No gallop.  Pulmonary:     Effort: No respiratory distress.     Breath sounds: No wheezing.  Abdominal:     General: There is no distension.     Tenderness: There is no abdominal tenderness. There is no guarding or rebound.  Musculoskeletal:        General: Swelling present. Normal range of motion.     Cervical back: Normal range of motion and neck supple.     Comments: Isolated right lower extremity edema.  Varicose veins noted on the left.  Pulse motor and sensation intact.  Some pain with range of motion of the knee.  No erythema no warmth no edema.  Skin:    Coloration: Skin is not pale.     Findings: No rash.  Neurological:     Mental Status: He is alert and oriented to person, place, and time.  Psychiatric:        Behavior: Behavior normal.     (all labs ordered are listed, but only abnormal results are displayed) Labs Reviewed - No data to display  EKG: None  Radiology: DG Knee Complete 4 Views Left Result Date: 08/13/2024 EXAM: 4 VIEW(S) XRAY OF THE LEFT KNEE 08/13/2024 04:35:00 PM COMPARISON: None available. CLINICAL HISTORY: knee pain. Left knee pain, denies any injury. No swelling noted. FINDINGS: BONES AND JOINTS: No acute fracture. No focal osseous lesion. No joint dislocation. No significant joint effusion. Minimal medial compartment joint space narrowing. SOFT TISSUES: Vascular calcifications. The soft tissues are otherwise unremarkable. IMPRESSION: 1. Minimal medial compartment joint space narrowing. 2. Vascular calcifications. Electronically signed by: Dayne Hassell MD 08/13/2024 04:46 PM EDT RP Workstation: [id number]     Procedures   Medications Ordered in the ED  acetaminophen  (TYLENOL ) tablet 1,000 mg (1,000 mg Oral Given 08/13/24 1540)  ketorolac  (TORADOL ) 15 MG/ML injection 15  mg (15 mg Intramuscular Given 08/13/24 1540)                                    Medical Decision Making Amount and/or Complexity of Data Reviewed Radiology: ordered.  Risk OTC drugs. Prescription drug management.   83 yo M with a chief complaints of left knee pain.  Going on since yesterday.  He has a swollen right leg which he says has been going on for a while.  He said the left leg was also swollen but seem to have improved.  He denies injury  to the knee.  States compliance with his blood thinner.  Will obtain a plain film of the knee.  Seems unlikely to be DVT with no edema and pain worse with movement.  Stated Compliance with his Factor 10a inhibitor.  Plain film of the knee on my independent interpretation with no fracture or dislocation.  Will treat supportively.  Patient feeling much better after Toradol  here.  Knee sleeve for comfort.  PCP follow-up.  5:01 PM:  I have discussed the diagnosis/risks/treatment options with the patient.  Evaluation and diagnostic testing in the emergency department does not suggest an emergent condition requiring admission or immediate intervention beyond what has been performed at this time.  They will follow up with PCP. We also discussed returning to the ED immediately if new or worsening sx occur. We discussed the sx which are most concerning (e.g., sudden worsening pain, fever, inability to tolerate by mouth, swelling, redness) that necessitate immediate return. Medications administered to the patient during their visit and any new prescriptions provided to the patient are listed below.  Medications given during this visit Medications  acetaminophen  (TYLENOL ) tablet 1,000 mg (1,000 mg Oral Given 08/13/24 1540)  ketorolac  (TORADOL ) 15 MG/ML injection 15 mg (15 mg Intramuscular Given 08/13/24 1540)     The patient appears reasonably screen and/or stabilized for discharge and I doubt any other medical condition or other [facility] requiring  further screening, evaluation, or treatment in the ED at this time prior to discharge.       Final diagnoses:  Acute pain of left knee    ED Discharge Orders          Ordered    diclofenac Sodium (VOLTAREN) 1 % GEL  4 times daily        08/13/24 1655               [place], DO 08/13/24 1701

## 2024-08-13 NOTE — ED Triage Notes (Signed)
 Pt coming in with reports of pain in LLE with swollen superficial blood vessels in lower leg. Pt states this started last night. No obvious redness/generalized swelling/warmth noted on assessment.

## 2024-08-13 NOTE — ED Triage Notes (Signed)
 Pt reports pain is worse in left kneecap, no redness/swelling noted. Denies injuries.

## 2024-08-13 NOTE — Discharge Instructions (Signed)
 Please call your family doctor and let them know about your visit here today.  Please return for redness or if he develop a fever or rapid swelling.  Use the gel as prescribed. Also take tylenol  1000mg (2 extra strength) four times a day.

## 2024-08-13 NOTE — Progress Notes (Signed)
 Complex Care Management Care Guide Note  08/13/2024 Name: Nicolaos Mitrano MRN: [id number] DOB: 05-Jan-1941  Bush Murdoch is a 83 y.o. year old male who is a primary care patient of O'Sullivan, Melissa, NP and is actively engaged with the care management team. I reached out to Edrick Getting by phone today to assist with re-scheduling  with the Licensed Clinical Child psychotherapist.  Follow up plan: Unsuccessful telephone outreach attempt made. A HIPAA compliant phone message was left for the patient providing contact information and requesting a return call.  Jeoffrey Buffalo , RMA     [facility]  [facility], [facility] Guide  Direct Dial: [phone]  Website: [email or web address]

## 2024-08-13 NOTE — ED Notes (Addendum)
 Pt discharged to home using teachback Method. Discharge instructions have been discussed with patient and/or family members. Pt verbally acknowledges understanding d/c instructions, has been given opportunity for questions to be answered, and endorses comprehension to checkout at registration before leaving. Pt a/o at the time of discharge. RR even and unlabored. No acute distress noted.  Pt declined wheelchair. Pt ambulatory with stand by assist and personal cane  to registration

## 2024-08-16 ENCOUNTER — Encounter: Payer: Self-pay | Admitting: Licensed Clinical Social Worker

## 2024-08-16 ENCOUNTER — Telehealth

## 2024-08-16 ENCOUNTER — Other Ambulatory Visit: Payer: Self-pay | Admitting: Licensed Clinical Social Worker

## 2024-08-16 NOTE — Patient Instructions (Signed)
 Robert Bates - I am sorry I was unable to reach you today for our scheduled appointment. I work with Daryl Setter, NP and am calling to support your healthcare needs. Please contact me at [phone] at your earliest convenience. I look forward to speaking with you soon.   Thank you,  Tobias [name] [name], PhD [facility], [facility] Social Worker Direct Dial: [phone]  Fax: [phone]

## 2024-08-16 NOTE — Patient Outreach (Signed)
 Complex Care Management   Visit Note  08/12/2024  Name:  Robert Bates MRN: [id number] DOB: [birth year]  Situation: Referral received for Complex Care Management related to SDOH Barriers:  Housing is not living with niece  I obtained verbal consent from Patient.  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 Rockey Peru.    Peripheral vascular disease    Abdominal Aortic Aneursym   Pneumonia    as a child   Radiation 09/18/15-10/25/15   left lower lobe 70.2 Gy   Seizures (HCC) 02/18/2020   Tobacco abuse     Assessment:Patient was living with his niece but is not anymore and now living in a hotel. He stated that his brother gave him money and he has enough for 2 weeks. He is currently ion the [employer] authority list he stated that he was going to call the [facility], SW provided the number and will give update on the next call     SDOH Interventions    Flowsheet Row Patient Outreach from 08/12/2024 in [place] POPULATION HEALTH DEPARTMENT Patient Outreach Telephone from  08/09/2024 in [place] POPULATION HEALTH DEPARTMENT Patient Outreach from 06/18/2024 in [place] POPULATION HEALTH DEPARTMENT Patient Outreach Telephone from 06/04/2024 in [place] POPULATION HEALTH DEPARTMENT Patient Outreach Telephone from 05/27/2024 in [place] POPULATION HEALTH DEPARTMENT Office Visit from 05/21/2024 in [facility]  SDOH Interventions        Food Insecurity Interventions Intervention Not Indicated -- Intervention Not Indicated Intervention Not Indicated Intervention Not Indicated --  Housing Interventions Community Resources Provided  [Patient is on the waitlist gor [place] [facility]] Other (Comment)  [LCSW consulted with SW Bartley and she plans to follow up with patient today.] [employer] Provided  [employer] Provided  [SW will send resources] Other (Comment)  [Patient will be provided housing resources] --  Transportation Interventions -- -- Intervention Not Indicated Intervention Not Indicated Intervention Not Indicated --  Utilities Interventions Intervention Not Indicated -- Intervention Not Indicated Intervention Not Indicated Intervention Not Indicated --  Depression Interventions/Treatment  -- -- -- -- Medication Medication  Financial Strain Interventions -- -- Intervention Not Indicated Intervention Not Indicated Other (Comment)  [Patient will be provided with community resources] --    Recommendation:   none  Follow Up Plan:   Telephone follow up appointment date/time:  08/16/2024 at 1:00 pm  Tobias [name] Moose, BSW,  PhD [facility], [facility] Social Worker Direct Dial: [phone]  Fax: [phone]

## 2024-08-16 NOTE — Patient Instructions (Signed)
 Visit Information  Thank you for taking time to visit with me today. Please don't hesitate to contact me if I can be of assistance to you before our next scheduled appointment.  Your next care management appointment is by telephone on 08/30/2024 at 1:00 pm  Please call the care guide team at [phone] if you need to cancel, schedule, or reschedule an appointment.   Please call the Suicide and Crisis Lifeline: 988 go to [facility] Urgent [facility] [address], [place] [phone]) call 911 if you are experiencing a Mental Health or Behavioral Health Crisis or need someone to talk to.  Tobias [name] [name], PhD [facility], [facility] Social Worker Direct Dial: [phone]  Fax: [phone]

## 2024-08-16 NOTE — Patient Outreach (Signed)
 Complex Care Management   Visit Note  08/16/2024  Name:  Robert Bates MRN: [id number] DOB: 1941-08-03  Situation: Referral received for Complex Care Management related to SDOH Barriers:  Housing Housing authority  I obtained verbal consent from Patient.  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 Rockey Peru.    Peripheral vascular disease    Abdominal Aortic Aneursym   Pneumonia    as a child   Radiation 09/18/15-10/25/15   left lower lobe 70.2 Gy   Seizures (HCC) 02/18/2020   Tobacco abuse     Assessment: Patient stated that he is still living in a hotel and he did do call the [employer] , but plans to go over there today.    Recommendation:   none  Follow Up Plan:   Telephone follow up appointment date/time:  08/30/2024 at 1:00 pm  Tobias [name] [name], PhD [facility], [facility] Social Worker Direct Dial: [phone]  Fax: [phone]

## 2024-08-16 NOTE — Patient Instructions (Signed)
 Visit Information  Thank you for taking time to visit with me today. Please don't hesitate to contact me if I can be of assistance to you before our next scheduled appointment.  Your next care management appointment is by telephone on 08/16/2024 at 1:00 pm    Please call the care guide team at [phone] if you need to cancel, schedule, or reschedule an appointment.   Please call the Suicide and Crisis Lifeline: 988 go to [facility] Urgent [facility] [address], [place] [phone]) call 911 if you are experiencing a Mental Health or Behavioral Health Crisis or need someone to talk to.  Tobias [name] [name], PhD [facility], [facility] Social Worker Direct Dial: [phone]  Fax: [phone]

## 2024-08-19 ENCOUNTER — Other Ambulatory Visit (HOSPITAL_BASED_OUTPATIENT_CLINIC_OR_DEPARTMENT_OTHER): Payer: Self-pay

## 2024-08-19 ENCOUNTER — Encounter (HOSPITAL_BASED_OUTPATIENT_CLINIC_OR_DEPARTMENT_OTHER): Payer: Self-pay | Admitting: Emergency Medicine

## 2024-08-19 ENCOUNTER — Emergency Department (HOSPITAL_BASED_OUTPATIENT_CLINIC_OR_DEPARTMENT_OTHER)
Admission: EM | Admit: 2024-08-19 | Discharge: 2024-08-19 | Disposition: A | Discharge status: Home / Self Care | Attending: Emergency Medicine | Admitting: Emergency Medicine

## 2024-08-19 ENCOUNTER — Emergency Department (HOSPITAL_BASED_OUTPATIENT_CLINIC_OR_DEPARTMENT_OTHER)

## 2024-08-19 ENCOUNTER — Other Ambulatory Visit: Payer: Self-pay

## 2024-08-19 DIAGNOSIS — K429 Umbilical hernia without obstruction or gangrene: Secondary | ICD-10-CM | POA: Insufficient documentation

## 2024-08-19 DIAGNOSIS — N390 Urinary tract infection, site not specified: Secondary | ICD-10-CM | POA: Insufficient documentation

## 2024-08-19 DIAGNOSIS — K449 Diaphragmatic hernia without obstruction or gangrene: Secondary | ICD-10-CM | POA: Insufficient documentation

## 2024-08-19 DIAGNOSIS — E871 Hypo-osmolality and hyponatremia: Secondary | ICD-10-CM | POA: Insufficient documentation

## 2024-08-19 DIAGNOSIS — N210 Calculus in bladder: Secondary | ICD-10-CM | POA: Insufficient documentation

## 2024-08-19 DIAGNOSIS — I7090 Unspecified atherosclerosis: Secondary | ICD-10-CM | POA: Insufficient documentation

## 2024-08-19 DIAGNOSIS — R350 Frequency of micturition: Secondary | ICD-10-CM | POA: Diagnosis present

## 2024-08-19 DIAGNOSIS — I714 Abdominal aortic aneurysm, without rupture, unspecified: Secondary | ICD-10-CM | POA: Diagnosis not present

## 2024-08-19 DIAGNOSIS — N200 Calculus of kidney: Secondary | ICD-10-CM | POA: Diagnosis not present

## 2024-08-19 DIAGNOSIS — Z7901 Long term (current) use of anticoagulants: Secondary | ICD-10-CM | POA: Diagnosis not present

## 2024-08-19 LAB — CBC WITH DIFFERENTIAL/PLATELET
Abs Immature Granulocytes: 0.05 K/uL (ref 0.00–0.07)
Basophils Absolute: 0 K/uL (ref 0.0–0.1)
Basophils Relative: 0 %
Eosinophils Absolute: 0.1 K/uL (ref 0.0–0.5)
Eosinophils Relative: 1 %
HCT: 32.5 % — ABNORMAL LOW (ref 39.0–52.0)
Hemoglobin: 10.1 g/dL — ABNORMAL LOW (ref 13.0–17.0)
Immature Granulocytes: 1 %
Lymphocytes Relative: 11 %
Lymphs Abs: 1.1 K/uL (ref 0.7–4.0)
MCH: 26.6 pg (ref 26.0–34.0)
MCHC: 31.1 g/dL (ref 30.0–36.0)
MCV: 85.5 fL (ref 80.0–100.0)
Monocytes Absolute: 0.8 K/uL (ref 0.1–1.0)
Monocytes Relative: 8 %
Neutro Abs: 7.3 K/uL (ref 1.7–7.7)
Neutrophils Relative %: 79 %
Platelets: 263 K/uL (ref 150–400)
RBC: 3.8 MIL/uL — ABNORMAL LOW (ref 4.22–5.81)
RDW: 16.9 % — ABNORMAL HIGH (ref 11.5–15.5)
WBC: 9.3 K/uL (ref 4.0–10.5)
nRBC: 0 % (ref 0.0–0.2)

## 2024-08-19 LAB — BASIC METABOLIC PANEL WITH GFR
Anion gap: 12 (ref 5–15)
BUN: 28 mg/dL — ABNORMAL HIGH (ref 8–23)
CO2: 22 mmol/L (ref 22–32)
Calcium: 9 mg/dL (ref 8.9–10.3)
Chloride: 100 mmol/L (ref 98–111)
Creatinine, Ser: 1.13 mg/dL (ref 0.61–1.24)
GFR, Estimated: >60 mL/min (ref >60)
Glucose, Bld: 127 mg/dL — ABNORMAL HIGH (ref 70–99)
Potassium: 4.2 mmol/L (ref 3.5–5.1)
Sodium: 134 mmol/L — ABNORMAL LOW (ref 135–145)

## 2024-08-19 LAB — URINALYSIS, W/ REFLEX TO CULTURE (INFECTION SUSPECTED)
Glucose, UA: NEGATIVE mg/dL
Ketones, ur: NEGATIVE mg/dL
Leukocytes,Ua: NEGATIVE
Nitrite: POSITIVE — AB
Protein, ur: >=300 mg/dL — AB
RBC / HPF: >50 RBC/hpf (ref 0–5)
Specific Gravity, Urine: >=1.03 (ref 1.005–1.030)
pH: 6 (ref 5.0–8.0)

## 2024-08-19 MED ORDER — SODIUM CHLORIDE 0.9 % IV SOLN
1.0000 g | Freq: Once | INTRAVENOUS | Status: AC
  Administered 2024-08-19: 1 g via INTRAVENOUS
  Filled 2024-08-19: qty 10

## 2024-08-19 MED ORDER — ACETAMINOPHEN 325 MG PO TABS
650.0000 mg | ORAL_TABLET | Freq: Once | ORAL | Status: AC
  Administered 2024-08-19: 650 mg via ORAL
  Filled 2024-08-19: qty 2

## 2024-08-19 MED ORDER — CEPHALEXIN 500 MG PO CAPS
500.0000 mg | ORAL_CAPSULE | Freq: Three times a day (TID) | ORAL | 0 refills | Status: DC
  Filled 2024-08-19: qty 30, 10d supply, fill #0

## 2024-08-19 NOTE — ED Triage Notes (Signed)
 Urinary retention since yesterday about 11:00a Staes pain and pressure. With some leakage.

## 2024-08-19 NOTE — ED Provider Notes (Signed)
 [place] [facility] Provider Note   CSN: [id number] Arrival date & time: 08/19/24  9680     Patient presents with: Urinary Retention   Robert Bates is a 83 y.o. male.   Presents to the emergency department for evaluation of significantly decreased urine volume with urine frequency since this morning.       Prior to Admission medications   Medication Sig Start Date End Date Taking? Authorizing Provider  acetaminophen  (TYLENOL ) 500 MG tablet Take 1-2 tablets (500-1,000 mg total) by mouth every 6 (six) hours as needed. Patient taking differently: Take 500 mg by mouth every 12 (twelve) hours as needed. 05/16/21   Zimmerman, Donielle M, PA-C  amiodarone  (PACERONE ) 200 MG tablet Take 1 tablet (200 mg total) by mouth daily. 10/15/23   Nahser, Aleene PARAS, MD  atorvastatin  (LIPITOR ) 80 MG tablet Take 1 tablet (80 mg total) by mouth daily. 04/30/24   O'Sullivan, Melissa, NP  benzonatate  (TESSALON ) 100 MG capsule Take 1 capsule (100 mg total) by mouth 3 (three) times daily as needed. 06/16/24   O'Sullivan, Melissa, NP  budesonide -glycopyrrolate -formoterol  (BREZTRI  AEROSPHERE) 160-9-4.8 MCG/ACT AERO inhaler Inhale 2 puffs into the lungs 2 (two) times daily. 04/14/24   O'Sullivan, Melissa, NP  diclofenac Sodium (VOLTAREN) 1 % GEL Apply 4 g topically 4 (four) times daily. 08/13/24   Emil Share, DO  diltiazem  (CARDIZEM  CD) 120 MG 24 hr capsule Take 1 capsule (120 mg total) by mouth daily. 01/28/24   O'Sullivan, Melissa, NP  doxycycline  (VIBRAMYCIN ) 100 MG capsule Take 1 capsule (100 mg total) by mouth 2 (two) times daily for 7 days. 07/30/24   Emil Share, DO  furosemide  (LASIX ) 40 MG tablet Take 1 tablet (40 mg total) by mouth daily. 06/22/24   O'Sullivan, Melissa, NP  gabapentin  (NEURONTIN ) 100 MG capsule Take 1 capsule (100 mg total) by mouth 3 (three) times daily. 07/21/24   O'Sullivan, Melissa, NP  levalbuterol  (XOPENEX ) 0.63 MG/3ML nebulizer solution Take 3 mLs  (0.63 mg total) by nebulization every 4 (four) hours as needed for wheezing or shortness of breath. 04/14/24   O'Sullivan, Melissa, NP  lisinopril  (ZESTRIL ) 2.5 MG tablet Take 1 tablet (2.5 mg total) by mouth daily. 06/26/24   O'Sullivan, Melissa, NP  omeprazole  (PRILOSEC) 40 MG capsule Take 1 capsule (40 mg total) by mouth daily. 01/28/24   O'Sullivan, Melissa, NP  ondansetron  (ZOFRAN ) 4 MG tablet Take 1 tablet (4 mg total) by mouth every 8 (eight) hours as needed for nausea or vomiting. 07/21/24   Daryl Setter, NP  potassium chloride  (KLOR-CON ) 10 MEQ tablet Take 10 mEq by mouth daily.    [provider]  rivaroxaban  (XARELTO ) 20 MG TABS tablet TAKE 1 TABLET BY MOUTH EVERY DAY 04/26/24   O'Sullivan, Melissa, NP  sertraline  (ZOLOFT ) 50 MG tablet Take 1 tablet (50 mg total) by mouth daily. 05/21/24   O'Sullivan, Melissa, NP  spironolactone  (ALDACTONE ) 25 MG tablet Take 1 tablet (25 mg total) by mouth daily. 06/26/24   O'Sullivan, Melissa, NP  tamsulosin  (FLOMAX ) 0.4 MG CAPS capsule Take 1 capsule (0.4 mg total) by mouth at bedtime. 06/11/24   O'Sullivan, Melissa, NP  traMADol  (ULTRAM ) 50 MG tablet Take 1 tablet (50 mg total) by mouth every 12 (twelve) hours as needed. 07/27/24 08/26/24  O'Sullivan, Melissa, NP    Allergies: Iodine, Iohexol , Metformin  and related, and Metformin     Review of Systems  Updated Vital Signs BP 137/75 (BP Location: Right Arm)   Pulse 66  Temp 97.8 F (36.6 C)   Resp (!) 22   Ht 5' 9 (1.753 m)   Wt 78 kg   SpO2 96%   BMI 25.39 kg/m   Physical Exam Vitals and nursing note reviewed.  Constitutional:      General: He is not in acute distress.    Appearance: He is well-developed.  HENT:     Head: Normocephalic and atraumatic.     Mouth/Throat:     Mouth: Mucous membranes are moist.  Eyes:     General: Vision grossly intact. Gaze aligned appropriately.     Extraocular Movements: Extraocular movements intact.     Conjunctiva/sclera: Conjunctivae  normal.  Cardiovascular:     Rate and Rhythm: Normal rate and regular rhythm.     Pulses: Normal pulses.     Heart sounds: Normal heart sounds, S1 normal and S2 normal. No murmur heard.    No friction rub. No gallop.  Pulmonary:     Effort: Pulmonary effort is normal. No respiratory distress.     Breath sounds: Normal breath sounds.  Abdominal:     Palpations: Abdomen is soft.     Tenderness: There is abdominal tenderness in the suprapubic area. There is no guarding or rebound.     Hernia: No hernia is present.  Musculoskeletal:        General: No swelling.     Cervical back: Full passive range of motion without pain, normal range of motion and neck supple. No pain with movement, spinous process tenderness or muscular tenderness. Normal range of motion.     Right lower leg: No edema.     Left lower leg: No edema.  Skin:    General: Skin is warm and dry.     Capillary Refill: Capillary refill takes less than 2 seconds.     Findings: No ecchymosis, erythema, lesion or wound.  Neurological:     Mental Status: He is alert and oriented to person, place, and time.     GCS: GCS eye subscore is 4. GCS verbal subscore is 5. GCS motor subscore is 6.     Cranial Nerves: Cranial nerves 2-12 are intact.     Sensory: Sensation is intact.     Motor: Motor function is intact. No weakness or abnormal muscle tone.     Coordination: Coordination is intact.  Psychiatric:        Mood and Affect: Mood normal.        Speech: Speech normal.        Behavior: Behavior normal.     (all labs ordered are listed, but only abnormal results are displayed) Labs Reviewed  CBC WITH DIFFERENTIAL/PLATELET - Abnormal; Notable for the following components:      Result Value   RBC 3.80 (*)    Hemoglobin 10.1 (*)    HCT 32.5 (*)    RDW 16.9 (*)    All other components within normal limits  BASIC METABOLIC PANEL WITH GFR - Abnormal; Notable for the following components:   Sodium 134 (*)    Glucose, Bld 127 (*)     BUN 28 (*)    All other components within normal limits  URINALYSIS, W/ REFLEX TO CULTURE (INFECTION SUSPECTED) - Abnormal; Notable for the following components:   Color, Urine AMBER (*)    APPearance CLOUDY (*)    Hgb urine dipstick MODERATE (*)    Bilirubin Urine SMALL (*)    Protein, ur >=300 (*)    Nitrite POSITIVE (*)  Bacteria, UA RARE (*)    All other components within normal limits  URINE CULTURE    EKG: None  Radiology: CT RENAL STONE STUDY Result Date: 08/19/2024 EXAM: CT UROGRAM 08/19/2024 04:37:51 AM TECHNIQUE: CT of the abdomen and pelvis was performed before and after the administration of intravenous contrast as per CT urogram protocol. Multiplanar reformatted images as well as MIP urogram images are provided for review. Automated exposure control, iterative reconstruction, and/or weight based adjustment of the mA/kV was utilized to reduce the radiation dose to as low as reasonably achievable. COMPARISON: CT of the abdomen and pelvis dated 07/22/2023. CLINICAL HISTORY: Abdominal/flank pain, stone suspected. Urinary retention since yesterday with pain, especially to right flank, pressure, and leakage. Hx lung cancer, diabetes, diverticulitis, GERD, hiatal hernia, kidney stones, radiation, cholecystectomy, hernia repair, and insertion of mesh. FINDINGS: LOWER CHEST: There is a mild left pleural effusion, which has increased in size in the interim. LIVER: The liver is unremarkable. GALLBLADDER AND BILE DUCTS: The patient is status post cholecystectomy. No biliary ductal dilatation. SPLEEN: No acute abnormality. PANCREAS: No acute abnormality. ADRENAL GLANDS: There is a stable, small left adrenal adenoma. KIDNEYS, URETERS AND BLADDER: There are a couple of small nonobstructive calculi within the lower pole of the left kidney. There has been mild interval increase in size of an ovoid exophytic cyst arising laterally from the left kidney, which has increased in long axis from 3.1 cm  to 3.7 cm. An irregular calculus within the urinary bladder has increased in size from approximately 2.2 cm in long axis to 2.5 cm. No stones in the ureters. No hydronephrosis. No perinephric or periureteral stranding. There is no evidence of obstructive uropathy. GI AND BOWEL: Stomach demonstrates no acute abnormality. There is a moderate hiatus hernia, as before. There are numerous colonic diverticula, but no evidence of diverticulitis. There is no bowel obstruction. PERITONEUM AND RETROPERITONEUM: No ascites. No free air. VASCULATURE: There is fusiform aneurysmal dilatation of the abdominal aorta, measuring up to approximately 3.4 cm in diameter. There is extensive calcific atheromatous disease. LYMPH NODES: No lymphadenopathy. REPRODUCTIVE ORGANS: No acute abnormality. BONES AND SOFT TISSUES: No acute osseous abnormality. There is a small periumbilical fat-containing hernia. No focal soft tissue abnormality. IMPRESSION: 1. Irregular calculus within the urinary bladder has increased in size from approximately 2.2 cm to 2.5 cm. 2. Mild left pleural effusion, increased in size compared to prior study. 3. Moderate hiatus hernia, as before. 4. Small periumbilical fat-containing hernia. 5. Fusiform aneurysmal dilatation of the abdominal aorta, measuring up to approximately 3.4 cm in diameter, with extensive calcific atheromatous disease. Electronically signed by: Evalene Coho MD 08/19/2024 04:55 AM EDT RP Workstation: [id number]     Procedures   Medications Ordered in the ED  cefTRIAXone  (ROCEPHIN ) 1 g in sodium chloride  0.9 % 100 mL IVPB (has no administration in time range)                                    Medical Decision Making Amount and/or Complexity of Data Reviewed Labs: ordered. Decision-making details documented in ED Course. Radiology: ordered and independent interpretation performed. Decision-making details documented in ED Course.   Differential Diagnosis considered includes, but  not limited to: Renal colic/kidney stone; pyelonephritis; aortic dissection; diverticulitis  Presents to the emergency department with complaints of lower abdominal pain and concern for possible urinary retention.  He reports that he has only been dribbling since yesterday afternoon.  Bladder scan, however, at arrival showed no urine in the bladder.  Workup is more consistent with acute cystitis.       Final diagnoses:  Lower urinary tract infectious disease    ED Discharge Orders     None          Haze Lonni PARAS, MD 08/19/24 0501

## 2024-08-20 ENCOUNTER — Ambulatory Visit: Admitting: Family

## 2024-08-20 ENCOUNTER — Other Ambulatory Visit (HOSPITAL_BASED_OUTPATIENT_CLINIC_OR_DEPARTMENT_OTHER): Payer: Self-pay

## 2024-08-20 VITALS — BP 103/49 | HR 64 | Temp 98.0°F | Resp 16 | Ht 69.0 in | Wt 167.0 lb

## 2024-08-20 DIAGNOSIS — J449 Chronic obstructive pulmonary disease, unspecified: Secondary | ICD-10-CM

## 2024-08-20 DIAGNOSIS — N3000 Acute cystitis without hematuria: Secondary | ICD-10-CM

## 2024-08-20 DIAGNOSIS — I5032 Chronic diastolic (congestive) heart failure: Secondary | ICD-10-CM | POA: Diagnosis not present

## 2024-08-20 DIAGNOSIS — Z59819 Housing instability, housed unspecified: Secondary | ICD-10-CM

## 2024-08-20 HISTORY — DX: Acute cystitis without hematuria: N30.00

## 2024-08-20 NOTE — Assessment & Plan Note (Signed)
 BNP on 9/26 >900.  Appears still volume overloaded despite weight being down some. Recommend that he change lasix  to 40mg  AM and 20mg  PM.  Currently only taking 40mg  AM. Follow up in 1 week.

## 2024-08-20 NOTE — Patient Instructions (Addendum)
 Please call the social worker back to discuss housing: Tobias [name] [name], PhD [facility], [facility] Social Worker Direct Dial: [phone]  Fax: [phone]    Increase your furosemide  to 40mg  one tab in the AM and 20mg  1/2 tab in the afternoon.

## 2024-08-20 NOTE — Assessment & Plan Note (Signed)
 Preliminary urine culture growing Proteus 40K.  He has not picked up the Keflex  but plans to do so today.

## 2024-08-20 NOTE — Assessment & Plan Note (Signed)
 Continue Breztri and albuterol

## 2024-08-20 NOTE — Telephone Encounter (Signed)
 Patient notified he needs to be on farxiga  and stay off glipizide 

## 2024-08-20 NOTE — Assessment & Plan Note (Signed)
 Staying with a niece currently. I gave him the social worker's number to call to discuss housing resources.

## 2024-08-20 NOTE — Progress Notes (Signed)
 Subjective:     Patient ID: Robert Bates, male    DOB: 1941-09-13, 83 y.o.   MRN: [id number]  Chief Complaint  Patient presents with   Hypertension   Back Pain    Still having back pain, reports constipation with medication     Hypertension  Back Pain    Discussed the use of AI scribe software for clinical note transcription with the patient, who gave verbal consent to proceed.  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 Rockey [place].    Peripheral vascular disease    Abdominal Aortic Aneursym   Pneumonia    as a child   Radiation 09/18/15-10/25/15   left lower lobe 70.2 Gy   Seizures (HCC) 02/18/2020   Tobacco abuse     Past Surgical History:  Procedure Laterality Date   CHOLECYSTECTOMY N/A 07/23/2017   Procedure: LAPAROSCOPIC CHOLECYSTECTOMY;  Surgeon: Stevie  Herlene Righter, MD;  [facility]: [facility];  Service: General;  Laterality: N/A;   CLIPPING OF ATRIAL APPENDAGE Left 05/08/2021   Procedure: CLIPPING OF ATRIAL APPENDAGE USING 45 ATRICLIP;  Surgeon: German Bartlett PEDLAR, MD;  [facility];  Service: Open Heart Surgery;  Laterality: Left;   COLONOSCOPY     CORONARY ARTERY BYPASS GRAFT N/A 05/08/2021   Procedure: CORONARY ARTERY BYPASS GRAFTING (CABG)X 3 USING LEFT INTERNAL MAMMARY ARTERY AND RIGHT GREATER SAPEHNOUS VEIN;  Surgeon: German Bartlett PEDLAR, MD;  [facility];  Service: Open Heart Surgery;  Laterality: N/A;   ENDOVEIN HARVEST OF GREATER SAPHENOUS VEIN Right 05/08/2021   Procedure: ENDOVEIN HARVEST OF GREATER SAPHENOUS VEIN;  Surgeon: German Bartlett PEDLAR, MD;  [facility];  Service: Open Heart Surgery;  Laterality: Right;   EYE SURGERY Bilateral    Cataracts removed w/ lens implant   HERNIA REPAIR     Left 36 years ago . Right inguinal hernia repair 10-01-17 Dr. Stevie   INGUINAL HERNIA REPAIR Right 10/01/2017   Procedure: RIGHT INGUINAL HERNIA REPAIR WITH MESH;  Surgeon: Kinsinger, Herlene Righter, MD;  [facility]: [facility];  Service: General;  Laterality: Right;  TAP BLOCK   INSERTION OF MESH Right 10/01/2017   Procedure: INSERTION OF MESH;  Surgeon: Stevie Herlene Righter, MD;  [facility];  Service: General;  Laterality: Right;   IR THORACENTESIS ASP PLEURAL SPACE W/IMG GUIDE  05/18/2021   IR THORACENTESIS ASP PLEURAL SPACE W/IMG GUIDE  06/07/2021   LEFT HEART CATH AND CORONARY ANGIOGRAPHY N/A 04/20/2021   Procedure: LEFT HEART CATH AND CORONARY ANGIOGRAPHY;  Surgeon: Darron Deatrice LABOR, MD;  [facility];  Service: Cardiovascular;  Laterality: N/A;   TEE WITHOUT CARDIOVERSION N/A 05/08/2021   Procedure: TRANSESOPHAGEAL ECHOCARDIOGRAM (TEE);  Surgeon: German Bartlett PEDLAR, MD;  Location: [facility] OR;  Service: Open Heart Surgery;  Laterality: N/A;   TONSILLECTOMY     TONSILLECTOMY     VIDEO BRONCHOSCOPY Bilateral 07/26/2015   Procedure: VIDEO  BRONCHOSCOPY WITH FLUORO;  Surgeon: Ozell KATHEE America, MD;  [facility];  Service: Cardiopulmonary;  Laterality: Bilateral;   VIDEO BRONCHOSCOPY WITH ENDOBRONCHIAL NAVIGATION N/A 08/23/2015   Procedure: VIDEO BRONCHOSCOPY WITH ENDOBRONCHIAL NAVIGATION;  Surgeon: Dallas KATHEE Jude, MD;  [facility];  Service: Thoracic;  Laterality: N/A;   VIDEO BRONCHOSCOPY WITH ENDOBRONCHIAL ULTRASOUND N/A 08/23/2015   Procedure: VIDEO BRONCHOSCOPY WITH ENDOBRONCHIAL ULTRASOUND;  Surgeon: Dallas KATHEE Jude, MD;  [facility];  Service: Thoracic;  Laterality: N/A;    Family History  Problem Relation Age of Onset   Leukemia Father    Emphysema Father    Learning disabilities Son    Atrial fibrillation Son    Leukemia Other    Stroke Other     Social History   Socioeconomic History   Marital status: Widowed    Spouse name: Not on file   Number of children: 2   Years of education: Not on file   Highest education level: Not on file  Occupational History   Occupation: Retired    Associate Professor: [employer]    Comment: truck Air traffic controller: [employer]  Tobacco Use   Smoking status: Former    Current packs/day: 0.00    Average packs/day: 1 pack/day for 57.0 years (57.0 ttl pk-yrs)    Types: Cigarettes, Cigars    Start date: 08/07/1958    Quit date: 08/08/2015    Years since quitting: 9.0   Smokeless tobacco: Former    Types: Chew    Quit date: 11/04/1958   Tobacco comments:    Will smoke cigar every once in awhile.  Vaping daily started a couple weeks ago.  04/18/22 hfb    Patient states he is still vaping. AB, CMA 09-10-2023  Vaping Use   Vaping status: Some Days  Substance and Sexual Activity   Alcohol use: Not Currently    Alcohol/week: 0.0 standard drinks of alcohol   Drug use: No   Sexual activity: Not Currently  Other Topics Concern   Not on file  Social History Narrative   Not on file   Social Drivers of Health   Financial Resource Strain: Medium Risk (06/18/2024)    Overall Financial Resource Strain (CARDIA)    Difficulty of Paying Living Expenses: Somewhat hard  Food Insecurity: No Food Insecurity (08/16/2024)   Hunger Vital Sign    Worried About Running Out of Food in the Last Year: Never true    Ran Out of Food in the Last Year: Never true  Transportation Needs: No Transportation Needs (08/16/2024)   PRAPARE - Administrator, Civil Service (Medical): No    Lack of Transportation (Non-Medical): No  Physical Activity: Insufficiently Active (08/05/2021)   Exercise Vital Sign    Days of Exercise per Week: 2 days  Minutes of Exercise per Session: 40 min  Stress: Stress Concern Present (05/22/2023)   [employer] of Occupational Health - Occupational Stress Questionnaire    Feeling of Stress : Rather much  Social Connections: Moderately Isolated (05/13/2022)   Social Connection and Isolation Panel    Frequency of Communication with Friends and Family: Twice a week    Frequency of Social Gatherings with Friends and Family: Once a week    Attends Religious Services: Never    Database administrator or Organizations: No    Attends Banker Meetings: Never    Marital Status: Married  Catering manager Violence: Not At Risk (08/16/2024)   Humiliation, Afraid, Rape, and Kick questionnaire    Fear of Current or Ex-Partner: No    Emotionally Abused: No    Physically Abused: No    Sexually Abused: No    Outpatient Medications Prior to Visit  Medication Sig Dispense Refill   acetaminophen  (TYLENOL ) 500 MG tablet Take 1-2 tablets (500-1,000 mg total) by mouth every 6 (six) hours as needed. (Patient taking differently: Take 500 mg by mouth every 12 (twelve) hours as needed.) 30 tablet 0   amiodarone  (PACERONE ) 200 MG tablet Take 1 tablet (200 mg total) by mouth daily. 90 tablet 3   atorvastatin  (LIPITOR ) 80 MG tablet Take 1 tablet (80 mg total) by mouth daily. 90 tablet 1   benzonatate  (TESSALON ) 100 MG capsule Take 1 capsule (100  mg total) by mouth 3 (three) times daily as needed. 30 capsule 0   budesonide -glycopyrrolate -formoterol  (BREZTRI  AEROSPHERE) 160-9-4.8 MCG/ACT AERO inhaler Inhale 2 puffs into the lungs 2 (two) times daily.     cephALEXin  (KEFLEX ) 500 MG capsule Take 1 capsule (500 mg total) by mouth 3 (three) times daily. 30 capsule 0   diclofenac Sodium (VOLTAREN) 1 % GEL Apply 4 g topically 4 (four) times daily. 100 g 0   diltiazem  (CARDIZEM  CD) 120 MG 24 hr capsule Take 1 capsule (120 mg total) by mouth daily. 90 capsule 1   doxycycline  (VIBRAMYCIN ) 100 MG capsule Take 1 capsule (100 mg total) by mouth 2 (two) times daily for 7 days. 14 capsule 0   furosemide  (LASIX ) 40 MG tablet Take 1 tablet (40 mg total) by mouth daily. 90 tablet 0   gabapentin  (NEURONTIN ) 100 MG capsule Take 1 capsule (100 mg total) by mouth 3 (three) times daily. 90 capsule 3   levalbuterol  (XOPENEX ) 0.63 MG/3ML nebulizer solution Take 3 mLs (0.63 mg total) by nebulization every 4 (four) hours as needed for wheezing or shortness of breath. 75 mL 12   lisinopril  (ZESTRIL ) 2.5 MG tablet Take 1 tablet (2.5 mg total) by mouth daily. 90 tablet 1   omeprazole  (PRILOSEC) 40 MG capsule Take 1 capsule (40 mg total) by mouth daily. 90 capsule 1   ondansetron  (ZOFRAN ) 4 MG tablet Take 1 tablet (4 mg total) by mouth every 8 (eight) hours as needed for nausea or vomiting. 30 tablet 1   potassium chloride  (KLOR-CON ) 10 MEQ tablet Take 10 mEq by mouth daily.     rivaroxaban  (XARELTO ) 20 MG TABS tablet TAKE 1 TABLET BY MOUTH EVERY DAY 90 tablet 1   sertraline  (ZOLOFT ) 50 MG tablet Take 1 tablet (50 mg total) by mouth daily. 30 tablet 3   spironolactone  (ALDACTONE ) 25 MG tablet Take 1 tablet (25 mg total) by mouth daily. 90 tablet 3   tamsulosin  (FLOMAX ) 0.4 MG CAPS capsule Take 1 capsule (0.4 mg total) by mouth at bedtime. 90  capsule 1   traMADol  (ULTRAM ) 50 MG tablet Take 1 tablet (50 mg total) by mouth every 12 (twelve) hours as needed. 60 tablet 0    No facility-administered medications prior to visit.    Allergies  Allergen Reactions   Iodine Swelling    Neck, gland swelling   Iohexol  Swelling    Neck, gland swelling   Metformin  And Related Diarrhea   Metformin  Nausea Only    Review of Systems  Musculoskeletal:  Positive for back pain.       Objective:    Physical Exam Constitutional:      General: He is not in acute distress.    Appearance: He is well-developed.  HENT:     Head: Normocephalic and atraumatic.  Cardiovascular:     Rate and Rhythm: Normal rate and regular rhythm.     Heart sounds: No murmur heard. Pulmonary:     Effort: Pulmonary effort is normal. No respiratory distress.     Breath sounds: Examination of the left-upper field reveals wheezing. Examination of the left-middle field reveals decreased breath sounds. Examination of the left-lower field reveals decreased breath sounds. Decreased breath sounds and wheezing present. No rales.  Musculoskeletal:     Right lower leg: 3+ Edema present.     Left lower leg: 3+ Edema present.  Skin:    General: Skin is warm and dry.  Neurological:     Mental Status: He is alert and oriented to person, place, and time.  Psychiatric:        Behavior: Behavior normal.        Thought Content: Thought content normal.      BP (!) 103/49 (BP Location: Right Arm, Patient Position: Sitting, Cuff Size: Normal)   Pulse 64   Temp 98 F (36.7 C) (Oral)   Resp 16   Ht 5' 9 (1.753 m)   Wt 167 lb (75.8 kg)   SpO2 100%   BMI 24.66 kg/m  Wt Readings from Last 3 Encounters:  08/20/24 167 lb (75.8 kg)  08/19/24 171 lb 15.3 oz (78 kg)  08/13/24 172 lb (78 kg)       Assessment & Plan:   Problem List Items Addressed This Visit       Unprioritized   Housing insecurity   Staying with a niece currently. I gave him the social worker's number to call to discuss housing resources.       COPD GOLD II    Continue Breztri  and albuterol .       Chronic heart  failure with preserved ejection fraction (HFpEF) (HCC)   BNP on 9/26 >900.  Appears still volume overloaded despite weight being down some. Recommend that he change lasix  to 40mg  AM and 20mg  PM.  Currently only taking 40mg  AM. Follow up in 1 week.       Acute cystitis without hematuria - Primary   Preliminary urine culture growing Proteus 40K.  He has not picked up the Keflex  but plans to do so today.        I am having Edrick Getting maintain his acetaminophen , amiodarone , diltiazem , omeprazole , levalbuterol , Breztri  Aerosphere, Xarelto , atorvastatin , sertraline , tamsulosin , furosemide , benzonatate , spironolactone , lisinopril , potassium chloride , gabapentin , ondansetron , traMADol , doxycycline , diclofenac Sodium, and cephALEXin .  No orders of the defined types were placed in this encounter.

## 2024-08-21 LAB — URINE CULTURE: Culture: 40000 — AB

## 2024-08-22 ENCOUNTER — Telehealth (HOSPITAL_BASED_OUTPATIENT_CLINIC_OR_DEPARTMENT_OTHER): Payer: Self-pay | Admitting: *Deleted

## 2024-08-22 NOTE — Telephone Encounter (Signed)
 Post ED Visit - Positive Culture Follow-up  Culture report reviewed by antimicrobial stewardship pharmacist: [facility] []  Rankin Dee, Pharm.D. []  Venetia Gully, Pharm.D., BCPS AQ-ID []  Garrel Crews, Pharm.D., BCPS []  Almarie Lunger, [address].D., BCPS []  Hana, [place].D., BCPS, AAHIVP []  Rosaline Bihari, Pharm.D., BCPS, AAHIVP []  Vernell Meier, PharmD, BCPS []  Latanya Hint, PharmD, BCPS []  Donald Medley, PharmD, BCPS []  Rocky Bold, PharmD []  Dorothyann Alert, PharmD, BCPS [x]  Honora Brazier, PharmD  [facility] []  Rosaline Edison, PharmD []  Romona Bliss, PharmD []  Dolphus Roller, PharmD []  Veva Seip, Rph []  Vernell Daunt) Leonce, PharmD []  Eva Allis, PharmD []  Rosaline Millet, PharmD []  Iantha Batch, PharmD []  Arvin Gauss, PharmD []  Wanda Hasting, PharmD []  Ronal Rav, PharmD []  Rocky Slade, PharmD []  Bard Jeans, PharmD   Positive urine culture Treated with Cephalexin , organism sensitive to the same and no further patient follow-up is required at this time.  Robert Bates 08/22/2024, 11:18 AM

## 2024-08-24 ENCOUNTER — Telehealth: Payer: Self-pay | Admitting: Pharmacist

## 2024-08-24 ENCOUNTER — Other Ambulatory Visit: Payer: Self-pay | Admitting: Pharmacist

## 2024-08-24 NOTE — Telephone Encounter (Signed)
 Patient's niece Erminio, address [address] [place] [address]  Copied from CRM [id number]. Topic: Clinical - Medication Question >> Aug 23, 2024  1:45 PM Turkey A wrote: Reason for CRM: Patient Caregiver would like call back [phone] regarding patients medications. She is on [facility]

## 2024-08-24 NOTE — Telephone Encounter (Signed)
 Attempt was made to contact patient by phone today for follow up by Clinical Pharmacist regarding medication adherence.  Unable to reach patient. LM on VM with my contact number [phone].

## 2024-08-24 NOTE — Telephone Encounter (Signed)
 Spoke to patient's niece Erminio. She will like to get a copy of patient's updated medication list mailed to her address

## 2024-08-25 ENCOUNTER — Other Ambulatory Visit: Payer: Self-pay

## 2024-08-25 NOTE — Addendum Note (Signed)
 Addended by: WELLS LEVORN HERO on: 08/25/2024 04:58 PM   Modules accepted: Orders

## 2024-08-25 NOTE — Telephone Encounter (Signed)
 Patient missed phone appointment from yesterday 08/24/2024. He called back today but I was on the phone with another patient. Tried to call patient back but no answer. We already has phone appt for 08/31/2024 scheduled. LM on VM of patient that it is fine to wait until 10/28 to talk unless he has an immediate medication related need.

## 2024-08-25 NOTE — Telephone Encounter (Signed)
 Per Robert Bates. Patient is on Farxiga  5 mg from assistance program.  Please review list to verify is corrected. This will be printed out and mailed out to niece/ care giver

## 2024-08-25 NOTE — Telephone Encounter (Unsigned)
 Copied from CRM #[id number]. Topic: General - Other >> Aug 25, 2024  1:21 PM Franky GRADE wrote: Reason for CRM: Patient is returning a call he received from [facility], attempted to contact Tammy but no answer. Patient will expect a call back.

## 2024-08-26 NOTE — Telephone Encounter (Signed)
 Med list looks good. Thanks.

## 2024-08-26 NOTE — Telephone Encounter (Signed)
 Updated medication list sent to address provided

## 2024-08-27 ENCOUNTER — Ambulatory Visit: Admitting: Family

## 2024-08-30 ENCOUNTER — Other Ambulatory Visit: Payer: Self-pay | Admitting: Licensed Clinical Social Worker

## 2024-08-30 NOTE — Patient Outreach (Signed)
 Social Drivers of Health  Community Resource and Care Coordination Visit Note   08/30/2024  Name: Robert Bates MRN: [id number] DOB:[birth year]  Situation: Referral received for [facility] needs assessment and assistance related to Housing . I obtained verbal consent from Patient.  Visit completed with Patient on the phone.   Background:  Housing needed    Assessment: Patient was living in a hotel and is now living with his niece, the hotel was too expensive weekly, he wants to move again because his niece has too many dogs and cats. He is still looking for something    Goals Addressed             This Visit's Progress    BSW VBCI Social Work Care Plan       Problems:   Housing   CSW Clinical Goal(s):   Over the next 2 weeks the Patient will will follow up with resources on housing that will be mailed  as directed by Social Work.  Interventions:  SW will mail resources for [name] and [facility] for housing   Patient Goals/Self-Care Activities:  Follow up on housing resources and patient has applied for housing for [employer] and just applied about 1 month ago and is waiting for approval .  Plan:   Telephone follow up appointment with care management team member scheduled for:  09/14/2024 at 11:00 am       SDOH Interventions    Flowsheet Row Patient Outreach from 08/12/2024 in [place] POPULATION HEALTH DEPARTMENT Patient Outreach Telephone from 08/09/2024 in [place] POPULATION HEALTH DEPARTMENT Patient Outreach from 06/18/2024 in [place] POPULATION HEALTH DEPARTMENT Patient Outreach Telephone from 06/04/2024 in [place] POPULATION HEALTH DEPARTMENT Patient Outreach Telephone from 05/27/2024 in  POPULATION HEALTH DEPARTMENT Office Visit from 05/21/2024 in [facility]  SDOH Interventions        Food Insecurity Interventions Intervention Not Indicated -- Intervention Not Indicated Intervention Not Indicated  Intervention Not Indicated --  Housing Interventions Community Resources Provided  [Patient is on the waitlist gor [place] [facility]] Other (Comment)  [LCSW consulted with SW Cleary and she plans to follow up with patient today.] [employer] Provided  rite aid mailed] [employer] Provided  [SW will send resources] Other (Comment)  [Patient will be provided housing resources] --  Transportation Interventions -- -- Intervention Not Indicated Intervention Not Indicated Intervention Not Indicated --  Utilities Interventions Intervention Not Indicated -- Intervention Not Indicated Intervention Not Indicated Intervention Not Indicated --  Depression Interventions/Treatment  -- -- -- -- Medication Medication  Financial Strain Interventions -- -- Intervention Not Indicated Intervention Not Indicated Other (Comment)  [Patient will be provided with community resources] --    Recommendation:   follow up with Housing authority regarding housing needs  Follow Up Plan:   Telephone follow up appointment date/time:  09/14/2024 at 11:00 am  Tobias [name] [name], PhD [facility], [facility] Social Worker Direct Dial: [phone]  Fax: [phone]

## 2024-08-30 NOTE — Patient Instructions (Signed)
 Visit Information  Thank you for taking time to visit with me today. Please don't hesitate to contact me if I can be of assistance to you before our next scheduled appointment.  Your next care management appointment is by telephone on 09/14/2024 at 11:00 am    Please call the care guide team at [phone] if you need to cancel, schedule, or reschedule an appointment.   Please call the Suicide and Crisis Lifeline: 988 call 1-800-273-TALK (toll free, 24 hour hotline) go to [facility] [address] [place] [phone]) call 911 if you are experiencing a Mental Health or Behavioral Health Crisis or need someone to talk to.  Robert [name] [name], PhD [facility], [facility] Social Worker Direct Dial: [phone]  Fax: [phone]

## 2024-08-31 ENCOUNTER — Other Ambulatory Visit: Payer: Self-pay | Admitting: Pharmacist

## 2024-08-31 ENCOUNTER — Telehealth: Payer: Self-pay | Admitting: Pharmacist

## 2024-08-31 NOTE — Progress Notes (Signed)
 Second unsuccessful attempt was made to contact patient by phone today for follow up by Clinical Pharmacist regarding medication management and access.  Unable to reach patient. LM on VM with my contact number [phone] or [phone]

## 2024-08-31 NOTE — Telephone Encounter (Signed)
 Patient returned call and was transfer to [phone] per chart notations

## 2024-08-31 NOTE — Progress Notes (Signed)
 08/31/2024 Name: Robert Bates MRN: [id number] DOB: [birth year]  Chief Complaint  Patient presents with   Medication Management   Medication Adherence    Robert Bates is a 83 y.o. year old male who presented for a phone visit   They were referred to the pharmacist by their PCP for assistance in managing complex medication management.    Subjective: Patient reports today that he is at his sons home today. He was staying with this niece but there seems to be a misunderstanding between them and he has decided to move to his sons.   Care Team: Primary Care Provider: Daryl Setter, NP ; Next Scheduled Visit: November appointment cancelled due to PCP being our of office.   Medication Access/Adherence  Current Pharmacy:  [facility] [place] [facility] [address] [place] [address] Phone: [phone] Fax: [phone]  [facility] - [place], [place] - [address] [place] [place] [address] Phone: [phone] Fax: [phone]  [facility] [address] [place] - [address] [place] [address] Phone: [phone] Fax: [phone]  [facility] [address], [place] - [address] [place] [address] Phone: [phone] Fax: [phone]   Patient reports affordability concerns with their medications: Yes  Patient reports access/transportation concerns to their pharmacy: No  Patient reports adherence concerns with their medications:  Yes  - he is not sure if he should be taking glipizide  or Farxiga .   Reviewed medications with patient.     Objective:  Lab Results  Component Value Date   HGBA1C 6.2 (H) 06/18/2024    Lab Results  Component Value Date   CREATININE 1.13 08/19/2024   BUN 28 (H) 08/19/2024   NA 134 (L) 08/19/2024   K 4.2 08/19/2024   CL 100 08/19/2024   CO2 22 08/19/2024    Lab Results  Component  Value Date   CHOL 142 08/07/2023   HDL 79 08/07/2023   LDLCALC 47 08/07/2023   LDLDIRECT 142.2 12/26/2008   TRIG 81 08/07/2023   CHOLHDL 1.8 08/07/2023    Medications Reviewed Today     Reviewed by Carla Milling, RPH-CPP (Pharmacist) on 08/31/24 at 1418  Med List Status: <None>   Medication Order Taking? Sig Documenting Provider Last Dose Status Informant  acetaminophen  (TYLENOL ) 500 MG tablet [id number]  Take 1-2 tablets (500-1,000 mg total) by mouth every 6 (six) hours as needed.  Patient taking differently: Take 500 mg by mouth every 12 (twelve) hours as needed.   Dwan Kyla HERO, PA-C  Active Self           Med Note JUSTINO, Kallon Caylor B   Wed Jul 16, 2023 11:25 AM)    amiodarone  (PACERONE ) 200 MG tablet [id number] Yes Take 1 tablet (200 mg total) by mouth daily. Nahser, Aleene PARAS, MD  Active Self  atorvastatin  (LIPITOR ) 80 MG tablet [id number]  Take 1 tablet (80 mg total) by mouth daily.  Patient not taking: Reported on 08/31/2024   Daryl Setter, NP  Active Self  budesonide -glycopyrrolate -formoterol  (BREZTRI  AEROSPHERE) 160-9-4.8 MCG/ACT AERO inhaler [id number] Yes Inhale 2 puffs into the lungs 2 (two) times daily. Daryl Setter, NP  Active Self  cephALEXin  (KEFLEX ) 500 MG capsule [id number]  Take 1 capsule (500 mg total) by mouth 3 (three) times daily. Haze Lonni PARAS, MD  Active   dapagliflozin  propanediol (FARXIGA ) 5 MG TABS tablet [id number] Yes Take 5  mg by mouth daily. Gets from assistance program [provider]  Active   diclofenac Sodium (VOLTAREN) 1 % GEL [id number]  Apply 4 g topically 4 (four) times daily. Emil Share, DO  Active   diltiazem  (CARDIZEM  CD) 120 MG 24 hr capsule [id number] Yes Take 1 capsule (120 mg total) by mouth daily. Daryl Setter, NP  Active Self  furosemide  (LASIX ) 40 MG tablet [id number] Yes Take 1 tablet (40 mg total) by mouth daily. Daryl Setter, NP  Active   gabapentin  (NEURONTIN ) 100 MG capsule [id number] Yes Take  1 capsule (100 mg total) by mouth 3 (three) times daily. Daryl Setter, NP  Active   levalbuterol  (XOPENEX ) 0.63 MG/3ML nebulizer solution [id number]  Take 3 mLs (0.63 mg total) by nebulization every 4 (four) hours as needed for wheezing or shortness of breath. Daryl Setter, NP  Active Self  lisinopril  (ZESTRIL ) 2.5 MG tablet [id number] Yes Take 1 tablet (2.5 mg total) by mouth daily. Daryl Setter, NP  Active   omeprazole  (PRILOSEC) 40 MG capsule [id number] Yes Take 1 capsule (40 mg total) by mouth daily. Daryl Setter, NP  Active Self  ondansetron  (ZOFRAN ) 4 MG tablet [id number]  Take 1 tablet (4 mg total) by mouth every 8 (eight) hours as needed for nausea or vomiting. Daryl Setter, NP  Active   potassium chloride  (KLOR-CON ) 10 MEQ tablet [id number] Yes Take 10 mEq by mouth daily. [provider]  Active   rivaroxaban  (XARELTO ) 20 MG TABS tablet [id number] Yes TAKE 1 TABLET BY MOUTH EVERY DAY Daryl Setter, NP  Active Self  sertraline  (ZOLOFT ) 50 MG tablet [id number] Yes Take 1 tablet (50 mg total) by mouth daily. Daryl Setter, NP  Active Self  spironolactone  (ALDACTONE ) 25 MG tablet [id number] Yes Take 1 tablet (25 mg total) by mouth daily. Daryl Setter, NP  Active   tamsulosin  (FLOMAX ) 0.4 MG CAPS capsule [id number] Yes Take 1 capsule (0.4 mg total) by mouth at bedtime. Daryl Setter, NP  Active   Med List Note Deleta Debby SAUNDERS, [facility] 07/19/24 1335): Meds handled by Erminio Merritts, niece, [phone]                 Assessment/Plan:   Medication Management: - Reviewed medications with patient. Advised him that he is not to take glipizide . He should start Farxiga  - discussed that he is taking it for his heart and diabetes.  - Recommended he restart sertraline  for anxiety. - Patient [place]  - Reminded patient he should refill the following soon - omeprazole , diltiazem , sertraline ,  tamsulosin    Rescheduled appointment with PCP for 09/08/2024.  Follow Up Plan: 1 month   Madelin Ray, PharmD Clinical Pharmacist [facility]

## 2024-08-31 NOTE — Progress Notes (Signed)
 Patient called back - see notes for phone appt

## 2024-09-07 ENCOUNTER — Other Ambulatory Visit: Payer: Self-pay | Admitting: Family

## 2024-09-07 ENCOUNTER — Other Ambulatory Visit (HOSPITAL_BASED_OUTPATIENT_CLINIC_OR_DEPARTMENT_OTHER): Payer: Self-pay

## 2024-09-07 DIAGNOSIS — G8929 Other chronic pain: Secondary | ICD-10-CM

## 2024-09-07 MED ORDER — TRAMADOL HCL 50 MG PO TABS
50.0000 mg | ORAL_TABLET | Freq: Two times a day (BID) | ORAL | 0 refills | Status: AC | PRN
  Filled 2024-09-07: qty 60, 30d supply, fill #0

## 2024-09-08 ENCOUNTER — Ambulatory Visit: Admitting: Family

## 2024-09-10 ENCOUNTER — Other Ambulatory Visit (HOSPITAL_BASED_OUTPATIENT_CLINIC_OR_DEPARTMENT_OTHER): Payer: Self-pay

## 2024-09-14 ENCOUNTER — Other Ambulatory Visit: Payer: Self-pay | Admitting: Licensed Clinical Social Worker

## 2024-09-14 NOTE — Patient Instructions (Signed)
 Visit Information  Thank you for taking time to visit with me today. Please don't hesitate to contact me if I can be of assistance to you before our next scheduled appointment.  Your next care management appointment is by telephone on 11/01/2024 at 11:00 am    Please call the care guide team at [phone] if you need to cancel, schedule, or reschedule an appointment.   Please call the Suicide and Crisis Lifeline: 988 go to [facility] Urgent [facility] [address] [place] [phone]) call 911 if you are experiencing a Mental Health or Behavioral Health Crisis or need someone to talk to.  Robert Bates [name], PhD [facility], [facility] Social Worker Direct Dial: [phone]  Fax: [phone]

## 2024-09-14 NOTE — Patient Outreach (Addendum)
 Social Drivers of Health  Community Resource and Care Coordination Visit Note   09/14/2024  Name: Robert Bates MRN: [id number] DOB:Jul 22, 1941  Situation: Referral received for [facility] needs assessment and assistance related to Housing . I obtained verbal consent from Patient.  Visit completed with Patient on the phone.   Background:   SDOH Interventions Today    Flowsheet Row Most Recent Value  SDOH Interventions   Food Insecurity Interventions Intervention Not Indicated  Housing Interventions Community Resources Provided  [Patient has applied for Section 8 and will call to f/u on application]  Transportation Interventions Intervention Not Indicated  Utilities Interventions Intervention Not Indicated     Assessment:   Goals Addressed             This Visit's Progress    BSW VBCI Social Work Care Plan       Problems:   Housing   CSW Clinical Goal(s):   Over the next 2 weeks the Patient will will follow up with resources on housing that will be mailed  as directed by Social Work.  Interventions:  SW will mail resources for [name] and [facility] for housing   Patient Goals/Self-Care Activities:  Follow up on housing resources and patient has applied for housing for section 8 and just applied about 1 month ago and is waiting for approval .  Plan:   Telephone follow up appointment with care management team member scheduled for:  11/01/2024 at 11:00 am        Recommendation:   follow up with [facility] authority on section 8 regarding housing needs. Patient is currently at [facility] staying with his son, he not decided on staying there or not, because all his doctors are here in [place] and he doe snot want to change his PCP's. Patient stated that he applied for section 8 months ago and the SW instructed him to call Housing Authority to follow up on his application.  Follow Up Plan:   Telephone follow up appointment date/time:  11/01/2024 at  11:00 am  Tobias [name] [name], PhD [facility], [facility] Social Worker Direct Dial: [phone]  Fax: [phone]

## 2024-09-16 ENCOUNTER — Telehealth: Payer: Self-pay

## 2024-09-16 NOTE — Transitions of Care (Post Inpatient/ED Visit) (Signed)
   09/16/2024  Name: Robert Bates MRN: [id number] DOB: 1941/05/25  Today's TOC FU Call Status: Today's TOC FU Call Status:: Successful TOC FU Call Completed TOC FU Call Complete Date: 09/16/24  Attempted to reach the patient regarding the most recent Inpatient/ED visit.  Follow Up Plan: No further outreach attempts will be made at this time. We have been unable to contact the patient.  Contacted the patient. He is still in the hospital. He is currently in [facility] BSN, RN [place]  VBCI - [facility] Health RN Care Manager [phone]

## 2024-09-24 ENCOUNTER — Ambulatory Visit: Admitting: Family

## 2024-09-27 ENCOUNTER — Other Ambulatory Visit (HOSPITAL_BASED_OUTPATIENT_CLINIC_OR_DEPARTMENT_OTHER): Payer: Self-pay

## 2024-09-27 ENCOUNTER — Other Ambulatory Visit: Admitting: Pharmacist

## 2024-09-27 ENCOUNTER — Telehealth: Payer: Self-pay | Admitting: Pharmacist

## 2024-09-27 DIAGNOSIS — I5032 Chronic diastolic (congestive) heart failure: Secondary | ICD-10-CM

## 2024-09-27 DIAGNOSIS — J449 Chronic obstructive pulmonary disease, unspecified: Secondary | ICD-10-CM

## 2024-09-27 DIAGNOSIS — R110 Nausea: Secondary | ICD-10-CM

## 2024-09-27 MED ORDER — ONDANSETRON HCL 4 MG PO TABS
4.0000 mg | ORAL_TABLET | Freq: Three times a day (TID) | ORAL | 0 refills | Status: AC | PRN
  Filled 2024-09-27: qty 30, 10d supply, fill #0

## 2024-09-27 NOTE — Progress Notes (Signed)
 09/27/2024 Name: Robert Bates MRN: [id number] DOB: 10/20/1941  Chief Complaint  Patient presents with   Medication Adherence   Medication Management    Everette Mall is a 83 y.o. year old male who presented for a phone visit   They were referred to the pharmacist by their PCP for assistance in managing complex medication management.    Subjective: Patient reports today that he was hospitalized at [facility] in [place], [place] from 09/14/2024 to 09/17/2024. Patient reports has was experiencing shortness of breath and called 911. He was visiting his son who lives on [address].  Per patient he states that they removed fluid from his left lung during hospitalization. Denies any medication changes at discharge. 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 I am not able to see his hospital records or discharge report.   Care Team: Primary Care Provider: Daryl Setter, NP ; Next Scheduled Visit: November appointment cancelled due to PCP being out of office.   Medication Access/Adherence  Current Pharmacy:  [facility] [place] [facility] [address] [place] [address] Phone: [phone] Fax: [phone]  [facility] - [place], [place] - [address] [place]. [address] [place] [address] Phone: [phone] Fax: [phone]  [facility] [address] [place] - [address] [place] [address] Phone: [phone] Fax: [phone]  [facility] [address], [place] - [address] [place] [address] Phone: [phone] Fax: [phone]   Patient reports affordability concerns with their medications: Yes  Patient reports access/transportation concerns to their pharmacy: No  Patient reports adherence concerns with their  medications:  Yes  - he reports he has lost his bottle of atorvastatin  and was not sure if he should still be taking atorvastatin .   Reviewed medications with patient.     Objective:  Lab Results  Component Value Date   HGBA1C 6.2 (H) 06/18/2024    Lab Results  Component Value Date   CREATININE 1.13 08/19/2024   BUN 28 (H) 08/19/2024   NA 134 (L) 08/19/2024   K 4.2 08/19/2024   CL 100 08/19/2024   CO2 22 08/19/2024    Lab Results  Component Value Date   CHOL 142 08/07/2023   HDL 79 08/07/2023   LDLCALC 47 08/07/2023   LDLDIRECT 142.2 12/26/2008   TRIG 81 08/07/2023   CHOLHDL 1.8 08/07/2023    Medications Reviewed Today     Reviewed by Carla Milling, RPH-CPP (Pharmacist) on 09/27/24 at 1402  Med List Status: <None>   Medication Order Taking? Sig Documenting Provider Last Dose Status Informant  acetaminophen  (TYLENOL ) 500 MG tablet [id number]  Take 1-2 tablets (500-1,000 mg total) by mouth every 6 (six) hours as needed.  Patient taking differently: Take 500 mg by mouth every 12 (twelve) hours as needed.   Dwan Kyla HERO, PA-C  Active Self           Med Note JUSTINO, Falynn Ailey B   Wed Jul 16, 2023 11:25 AM)    amiodarone  (PACERONE ) 200 MG tablet [id number] Yes Take 1 tablet (200 mg total) by mouth daily. Nahser, Aleene PARAS, MD  Active Self  atorvastatin  (LIPITOR ) 80 MG tablet [id number]  Take 1 tablet (80 mg total) by mouth daily.  Patient not taking: Reported  on 09/27/2024   O'Sullivan, Melissa, NP  Active Self  budesonide -glycopyrrolate -formoterol  (BREZTRI  AEROSPHERE) 160-9-4.8 MCG/ACT AERO inhaler [id number] Yes Inhale 2 puffs into the lungs 2 (two) times daily. Daryl Setter, NP  Active Self   Patient not taking:   Discontinued 09/27/24 1323 (Completed Course)   dapagliflozin  propanediol (FARXIGA ) 5 MG TABS tablet [id number] Yes Take 5 mg by mouth daily. Gets from assistance program [provider]  Active   diclofenac  Sodium (VOLTAREN ) 1 % GEL [id number]   Apply 4 g topically 4 (four) times daily. Emil Share, DO  Active   diltiazem  (CARDIZEM  CD) 120 MG 24 hr capsule [id number] Yes Take 1 capsule (120 mg total) by mouth daily. Daryl Setter, NP  Active Self  furosemide  (LASIX ) 40 MG tablet [id number] Yes Take 1 tablet (40 mg total) by mouth daily. Daryl Setter, NP  Active   gabapentin  (NEURONTIN ) 100 MG capsule [id number] Yes Take 1 capsule (100 mg total) by mouth 3 (three) times daily. Daryl Setter, NP  Active   levalbuterol  (XOPENEX ) 0.63 MG/3ML nebulizer solution [id number]  Take 3 mLs (0.63 mg total) by nebulization every 4 (four) hours as needed for wheezing or shortness of breath. Daryl Setter, NP  Active Self  lisinopril  (ZESTRIL ) 2.5 MG tablet [id number] Yes Take 1 tablet (2.5 mg total) by mouth daily. Daryl Setter, NP  Active   omeprazole  (PRILOSEC) 40 MG capsule [id number] Yes Take 1 capsule (40 mg total) by mouth daily. Daryl Setter, NP  Active Self  ondansetron  (ZOFRAN ) 4 MG tablet [id number] Yes Take 1 tablet (4 mg total) by mouth every 8 (eight) hours as needed for nausea or vomiting. Daryl Setter, NP  Active   potassium chloride  (KLOR-CON ) 10 MEQ tablet [id number] Yes Take 10 mEq by mouth daily. [provider]  Active   rivaroxaban  (XARELTO ) 20 MG TABS tablet [id number] Yes TAKE 1 TABLET BY MOUTH EVERY DAY Daryl Setter, NP  Active Self  sertraline  (ZOLOFT ) 50 MG tablet [id number]  Take 1 tablet (50 mg total) by mouth daily.  Patient not taking: Reported on 09/27/2024   Daryl Setter, NP  Active Self  spironolactone  (ALDACTONE ) 25 MG tablet [id number] Yes Take 1 tablet (25 mg total) by mouth daily. Daryl Setter, NP  Active   tamsulosin  (FLOMAX ) 0.4 MG CAPS capsule [id number] Yes Take 1 capsule (0.4 mg total) by mouth at bedtime. Daryl Setter, NP  Active   traMADol  (ULTRAM ) 50 MG tablet [id number] Yes Take 1 tablet (50 mg total) by mouth every 12 (twelve) hours as  needed. Daryl Setter, NP  Active   Med List Note Deleta Debby SAUNDERS, [facility] 07/19/24 1335): Meds handled by Erminio Merritts, niece, [phone]                 Assessment/Plan:   Medication Management: - Reviewed medications with patient. Looks like he is due to fill the following - furosemide , potassium, spironolactone , sertraline , tamsulosin , atorvastatin . Patient states he only needed tamsulosin  and atorvastatin  right now - coordinated refill with pharmacy.  - Reviewed meds and reason to take them. Stressed importance of taking all meds but especially furosemide , spironolactone  and Farxiga  to help with fluid retention / CHF and shortness of breath. Also reminded him to take Breztri  2 puffs twice a day.   - Patient is getting Farxiga  and [place]  from [place] [employer] [place]. He requested change to having delivered to our office since he is between his niece and son's homes right now.  - Reached out to request records from [facility]  [id number] but because he has not PCP listed they are not able to fax records until patient sign records release. Completed release except address. Patient will stop by later today to sign.  - Sent message to PCP's CMA to help with scheduling hospital follow up with either PCP or other provider.   Follow Up Plan: 2 to 3 weeks.    Madelin Ray, PharmD Clinical Pharmacist [facility]

## 2024-09-27 NOTE — Telephone Encounter (Signed)
 Unfortunately, the patient or a LAR will need to make that request with the company. Thank you

## 2024-09-27 NOTE — Telephone Encounter (Signed)
 Patient is requesting that [employer] [place] medications be mailed to our office. He is living between his niece and son's homes in different cities.  Sending request to Med Assist Team to call [employer] to change shipping.

## 2024-09-28 ENCOUNTER — Ambulatory Visit: Admitting: *Deleted

## 2024-09-28 ENCOUNTER — Ambulatory Visit: Admitting: Medical

## 2024-09-28 ENCOUNTER — Telehealth: Payer: Self-pay | Admitting: *Deleted

## 2024-09-28 ENCOUNTER — Ambulatory Visit

## 2024-09-28 VITALS — Ht 69.0 in | Wt 167.0 lb

## 2024-09-28 DIAGNOSIS — Z0000 Encounter for general adult medical examination without abnormal findings: Secondary | ICD-10-CM

## 2024-09-28 NOTE — Patient Instructions (Addendum)
 Robert Bates,  Thank you for taking the time for your Medicare Wellness Visit. I appreciate your continued commitment to your health goals. Please review the care plan we discussed, and feel free to reach out if I can assist you further.  Please note that Annual Wellness Visits do not include a physical exam. Some assessments may be limited, especially if the visit was conducted virtually. If needed, we may recommend an in-person follow-up with your provider.  Ongoing Care Seeing your primary care provider every 3 to 6 months helps us  monitor your health and provide consistent, personalized care.   Eleanor Ponto, NP: 10/05/24 11:40am Medicare AWV:  10/06/25 1:40pm  Referrals If a referral was made during today's visit and you haven't received any updates within two weeks, please contact the referred provider directly to check on the status.  [facility] / Elspeth Aran: [phone].  Please call when you are able to schedule your annual diabetic eye exam.  [facility] (shelter) [phone] [facility] [name] (shelter) [phone] [employer] (shelter)  [phone]  Recommended Screenings:  You will need to get the following vaccines at your local pharmacy: Shingles if you decide to get it  Health Maintenance  Topic Date Due   Zoster (Shingles) Vaccine (1 of 2) Never done   Medicare Annual Wellness Visit  10/03/2023   Eye exam for diabetics  03/26/2024   COVID-19 Vaccine (6 - 2025-26 season) 09/28/2025*   Hemoglobin A1C  12/19/2024   Complete foot exam   01/27/2025   Yearly kidney health urinalysis for diabetes  06/18/2025   Yearly kidney function blood test for diabetes  08/19/2025   DTaP/Tdap/Td vaccine (3 - Td or Tdap) 07/27/2034   Pneumococcal Vaccine for age over 71  Completed   Flu Shot  Completed   Meningitis B Vaccine  Aged Out   Hepatitis C Screening  Discontinued  *Topic was postponed. The date shown is not the original due date.        09/28/2024    1:22 PM  Advanced Directives  Does Patient Have a Medical Advance Directive? Yes  Type of Estate Agent of [place];Living will  Does patient want to make changes to medical advance directive? No - Patient declined  Copy of Healthcare Power of Attorney in Chart? Yes - validated most recent copy scanned in chart (See row information)  Please send an email including your name and date of birth requesting to remove your current Healthcare Power of Attorney to  [email or web address]   Vision: Annual vision screenings are recommended for early detection of glaucoma, cataracts, and diabetic retinopathy. These exams can also reveal signs of chronic conditions such as diabetes and high blood pressure.  Dental: Annual dental screenings help detect early signs of oral cancer, gum disease, and other conditions linked to overall health, including heart disease and diabetes.  Please see the attached documents for additional preventive care recommendations.

## 2024-09-28 NOTE — Progress Notes (Signed)
 Chief Complaint  Patient presents with   Medicare Wellness     Subjective:   Robert Bates is a 83 y.o. male who presents for a Medicare Annual Wellness Visit.  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 Rockey [place].    Peripheral vascular disease    Abdominal Aortic Aneursym   Pneumonia    as a child   Radiation 09/18/15-10/25/15   left lower lobe 70.2 Gy   Seizures (HCC) 02/18/2020   Tobacco abuse    Past Surgical History:  Procedure Laterality Date   CHOLECYSTECTOMY N/A 07/23/2017   Procedure: LAPAROSCOPIC CHOLECYSTECTOMY;  Surgeon: Stevie Herlene Righter, MD;  [facility]: [facility];  Service: General;  Laterality: N/A;   CLIPPING OF ATRIAL APPENDAGE Left 05/08/2021   Procedure: CLIPPING OF ATRIAL APPENDAGE USING 45 ATRICLIP;  Surgeon: German Bartlett PEDLAR, MD;  Location: [facility];  Service: Open Heart Surgery;  Laterality: Left;   COLONOSCOPY     CORONARY ARTERY BYPASS GRAFT N/A 05/08/2021   Procedure: CORONARY ARTERY BYPASS GRAFTING (CABG)X 3 USING LEFT  INTERNAL MAMMARY ARTERY AND RIGHT GREATER SAPEHNOUS VEIN;  Surgeon: German Bartlett PEDLAR, MD;  [facility]: [facility];  Service: Open Heart Surgery;  Laterality: N/A;   ENDOVEIN HARVEST OF GREATER SAPHENOUS VEIN Right 05/08/2021   Procedure: ENDOVEIN HARVEST OF GREATER SAPHENOUS VEIN;  Surgeon: German Bartlett PEDLAR, MD;  [facility];  Service: Open Heart Surgery;  Laterality: Right;   EYE SURGERY Bilateral    Cataracts removed w/ lens implant   HERNIA REPAIR     Left 36 years ago . Right inguinal hernia repair 10-01-17 Dr. Stevie   INGUINAL HERNIA REPAIR Right 10/01/2017   Procedure: RIGHT INGUINAL HERNIA REPAIR WITH MESH;  Surgeon: Kinsinger, Herlene Righter, MD;  [facility]: [facility];  Service: General;  Laterality: Right;  TAP BLOCK   INSERTION OF MESH Right 10/01/2017   Procedure: INSERTION OF MESH;  Surgeon: Stevie Herlene Righter, MD;  [facility]: [facility];  Service: General;  Laterality: Right;   IR THORACENTESIS ASP PLEURAL SPACE W/IMG GUIDE  05/18/2021   IR THORACENTESIS ASP PLEURAL SPACE W/IMG GUIDE  06/07/2021   LEFT HEART CATH AND CORONARY ANGIOGRAPHY N/A 04/20/2021   Procedure: LEFT HEART CATH AND CORONARY ANGIOGRAPHY;  Surgeon: Darron Deatrice LABOR, MD;  [facility];  Service: Cardiovascular;  Laterality: N/A;   TEE WITHOUT CARDIOVERSION N/A 05/08/2021   Procedure: TRANSESOPHAGEAL ECHOCARDIOGRAM (TEE);  Surgeon: German Bartlett PEDLAR,  MD;  [facility];  Service: Open Heart Surgery;  Laterality: N/A;   TONSILLECTOMY     TONSILLECTOMY     VIDEO BRONCHOSCOPY Bilateral 07/26/2015   Procedure: VIDEO BRONCHOSCOPY WITH FLUORO;  Surgeon: Ozell KATHEE America, MD;  [facility];  Service: Cardiopulmonary;  Laterality: Bilateral;   VIDEO BRONCHOSCOPY WITH ENDOBRONCHIAL NAVIGATION N/A 08/23/2015   Procedure: VIDEO BRONCHOSCOPY WITH ENDOBRONCHIAL NAVIGATION;  Surgeon: Dallas KATHEE Jude, MD;  [facility];  Service: Thoracic;  Laterality: N/A;   VIDEO BRONCHOSCOPY WITH ENDOBRONCHIAL ULTRASOUND N/A  08/23/2015   Procedure: VIDEO BRONCHOSCOPY WITH ENDOBRONCHIAL ULTRASOUND;  Surgeon: Dallas KATHEE Jude, MD;  [facility];  Service: Thoracic;  Laterality: N/A;   Family History  Problem Relation Age of Onset   Leukemia Father    Emphysema Father    Learning disabilities Son    Atrial fibrillation Son    Leukemia Other    Stroke Other    Social History   Occupational History   Occupation: Retired    Associate Professor: [employer]    Comment: truck Air Traffic Controller: [employer]  Tobacco Use   Smoking status: Former    Current packs/day: 0.00    Average packs/day: 1 pack/day for 57.0 years (57.0 ttl pk-yrs)    Types: Cigarettes, Cigars    Start date: 08/07/1958    Quit date: 08/08/2015    Years since quitting: 9.1   Smokeless tobacco: Former    Types: Chew    Quit date: 11/04/1958   Tobacco comments:    Will smoke cigar every once in awhile.  Vaping daily started a couple weeks ago.  04/18/22 hfb    Patient states he is still vaping. AB, CMA 09-10-2023  Vaping Use   Vaping status: Some Days  Substance and Sexual Activity   Alcohol use: Not Currently    Alcohol/week: 0.0 standard drinks of alcohol   Drug use: No   Sexual activity: Not Currently   Tobacco Counseling Counseling given: Not Answered Tobacco comments: Will smoke cigar every once in awhile.  Vaping daily started a couple weeks ago.  04/18/22 hfb Patient states he is still vaping. AB, CMA 09-10-2023  SDOH Screenings   Food Insecurity: No Food Insecurity (09/28/2024)  Housing: High Risk (09/28/2024)  Transportation Needs: No Transportation Needs (09/28/2024)  Utilities: Not At Risk (09/28/2024)  Alcohol Screen: Low Risk  (10/02/2022)  Depression (PHQ2-9): Medium Risk (09/28/2024)  Financial Resource Strain: Medium Risk (06/18/2024)  Physical Activity: Inactive (09/28/2024)  Social Connections: Socially Isolated (09/28/2024)  Stress: Stress Concern Present (09/28/2024)  Tobacco Use: Medium Risk (09/28/2024)    See flowsheets for full screening details  Depression Screen PHQ 2 & 9 Depression Scale- Over the past 2 weeks, how often have you been bothered by any of the following problems? Little interest or pleasure in doing things: 0 (wants to do things but he is not physically able to any longer) Feeling down, depressed, or hopeless (PHQ Adolescent also includes...irritable): 1 PHQ-2 Total Score: 1 Trouble falling or staying asleep, or sleeping too much: 3 Feeling tired or having little energy: 3 (Has lung and heart disease) Poor appetite or overeating (PHQ Adolescent also includes...weight loss): 0 Feeling bad about yourself - or that you are a failure or have let yourself or your family down: 0 Trouble concentrating on things, such as reading the newspaper or watching television (PHQ Adolescent also includes...like school work): 0 Moving or speaking so slowly that other people could have noticed. Or the opposite -  being so fidgety or restless that you have been moving around a lot more than usual: 0 Thoughts that you would be better off dead, or of hurting yourself in some way: 0 PHQ-9 Total Score: 7 If you checked off any problems, how difficult have these problems made it for you to do your work, take care of things at home, or get along with other people?: Very difficult  Depression Treatment Depression Interventions/Treatment : Medication     Goals Addressed   None    Visit info / Clinical Intake: Medicare Wellness Visit Type:: Subsequent Annual Wellness Visit Persons participating in visit:: patient Medicare Wellness Visit Mode:: Telephone If telephone:: video declined Because this visit was a virtual/telehealth visit:: pt reported vitals If Telephone or Video please confirm:: I connected with the patient using audio enabled telemedicine application and verified that I am speaking with the correct person using two identifiers; I discussed the limitations of evaluation and  management by telemedicine; The patient expressed understanding and agreed to proceed Patient Location:: home Provider Location:: office Information given by:: patient Interpreter Needed?: No Pre-visit prep was completed: yes AWV questionnaire completed by patient prior to visit?: no Living arrangements:: (!) lives alone Patient's Overall Health Status Rating: (!) fair Typical amount of pain: (!) a lot Does pain affect daily life?: (!) yes (uses cane when back is hurting) Are you currently prescribed opioids?: no  Dietary Habits and Nutritional Risks How many meals a day?: 2 Eats fruit and vegetables daily?: yes (sometimes) Most meals are obtained by: eating out In the last 2 weeks, have you had any of the following?: none Diabetic:: (!) yes Any non-healing wounds?: no How often do you check your BS?: 0 Would you like to be referred to a Nutritionist or for Diabetic Management? : no  Functional Status Activities of Daily Living (to include ambulation/medication): Independent Ambulation: Independent with device- listed below Home Assistive Devices/Equipment: Cane Medication Administration: Independent Home Management: Independent Manage your own finances?: yes Primary transportation is: driving Concerns about vision?: no *vision screening is required for [facility]* (past due with Elspeth Aran, patient will call) Concerns about hearing?: (!) yes Uses hearing aids?: no (had hearing aids but lost them, will reach out to audiology)  Fall Screening Falls in the past year?: 1 Number of falls in past year: 0 Was there an injury with Fall?: 1 Fall Risk Category Calculator: 2 Patient Fall Risk Level: Moderate Fall Risk  Fall Risk Patient at Risk for Falls Due to: Impaired balance/gait; History of fall(s) Fall risk Follow up: Falls evaluation completed  Home and Transportation Safety: All rugs have non-skid backing?: N/A, no rugs All stairs or steps have railings?: N/A, no stairs Grab  bars in the bathtub or shower?: (!) no Have non-skid surface in bathtub or shower?: yes Good home lighting?: (!) no Regular seat belt use?: (!) no Hospital stays in the last year:: (!) yes How many hospital stays:: 2 Reason: COPD exacerbation, Neck/head pain and fluid on lungs  Cognitive Assessment Difficulty concentrating, remembering, or making decisions? : yes (gets off focus) Will 6CIT or Mini Cog be Completed: yes What year is it?: 0 points What month is it?: 0 points Give patient an address phrase to remember (5 components): [address], [place]  About what time is it?: 0 points Count backwards from 20 to 1: 0 points Say the months of the year in reverse: 2 points Repeat the address phrase from earlier: 0 points 6 CIT Score: 2 points  Advance  Directives (For Healthcare) Does Patient Have a Medical Advance Directive?: Yes Does patient want to make changes to medical advance directive?: No - Patient declined Type of Advance Directive: Healthcare Power of [place]; Living will Copy of Healthcare Power of Attorney in Chart?: Yes - validated most recent copy scanned in chart (See row information) Copy of Living Will in Chart?: Yes - validated most recent copy scanned in chart (See row information) Would patient like information on creating a medical advance directive?: No - Patient declined  Reviewed/Updated  Reviewed/Updated: Reviewed All (Medical, Surgical, Family, Medications, Allergies, Care Teams, Patient Goals)        Objective:    Today's Vitals   09/28/24 1302  Weight: 167 lb (75.8 kg)  Height: 5' 9 (1.753 m)   Body mass index is 24.66 kg/m.  Current Medications (verified) Outpatient Encounter Medications as of 09/28/2024  Medication Sig   acetaminophen  (TYLENOL ) 500 MG tablet Take 1-2 tablets (500-1,000 mg total) by mouth every 6 (six) hours as needed. (Patient taking differently: Take 500 mg by mouth every 12 (twelve) hours as needed.)   amiodarone   (PACERONE ) 200 MG tablet Take 1 tablet (200 mg total) by mouth daily.   atorvastatin  (LIPITOR ) 80 MG tablet Take 1 tablet (80 mg total) by mouth daily.   budesonide -glycopyrrolate -formoterol  (BREZTRI  AEROSPHERE) 160-9-4.8 MCG/ACT AERO inhaler Inhale 2 puffs into the lungs 2 (two) times daily.   Cyanocobalamin (VITAMIN B-12 CR PO) Take 1 tablet by mouth daily.   diclofenac  Sodium (VOLTAREN ) 1 % GEL Apply 4 g topically 4 (four) times daily.   diltiazem  (CARDIZEM  CD) 120 MG 24 hr capsule Take 1 capsule (120 mg total) by mouth daily.   furosemide  (LASIX ) 40 MG tablet Take 1 tablet (40 mg total) by mouth daily.   gabapentin  (NEURONTIN ) 100 MG capsule Take 1 capsule (100 mg total) by mouth 3 (three) times daily.   levalbuterol  (XOPENEX ) 0.63 MG/3ML nebulizer solution Take 3 mLs (0.63 mg total) by nebulization every 4 (four) hours as needed for wheezing or shortness of breath.   lisinopril  (ZESTRIL ) 2.5 MG tablet Take 1 tablet (2.5 mg total) by mouth daily.   omeprazole  (PRILOSEC) 40 MG capsule Take 1 capsule (40 mg total) by mouth daily.   ondansetron  (ZOFRAN ) 4 MG tablet Take 1 tablet (4 mg total) by mouth every 8 (eight) hours as needed for nausea or vomiting.   potassium chloride  (KLOR-CON ) 10 MEQ tablet Take 10 mEq by mouth daily.   rivaroxaban  (XARELTO ) 20 MG TABS tablet TAKE 1 TABLET BY MOUTH EVERY DAY   sertraline  (ZOLOFT ) 50 MG tablet Take 1 tablet (50 mg total) by mouth daily.   spironolactone  (ALDACTONE ) 25 MG tablet Take 1 tablet (25 mg total) by mouth daily.   tamsulosin  (FLOMAX ) 0.4 MG CAPS capsule Take 1 capsule (0.4 mg total) by mouth at bedtime.   traMADol  (ULTRAM ) 50 MG tablet Take 1 tablet (50 mg total) by mouth every 12 (twelve) hours as needed.   dapagliflozin  propanediol (FARXIGA ) 5 MG TABS tablet Take 5 mg by mouth daily. Gets from assistance program (Patient not taking: Reported on 09/28/2024)   No facility-administered encounter medications on file as of 09/28/2024.    Hearing/Vision screen No results found. Immunizations and Health Maintenance Health Maintenance  Topic Date Due   Zoster Vaccines- Shingrix (1 of 2) Never done   OPHTHALMOLOGY EXAM  03/26/2024   COVID-19 Vaccine (6 - 2025-26 season) 09/28/2025 (Originally 07/05/2024)   HEMOGLOBIN A1C  12/19/2024   FOOT EXAM  01/27/2025  Diabetic kidney evaluation - Urine ACR  06/18/2025   Diabetic kidney evaluation - eGFR measurement  08/19/2025   Medicare Annual Wellness (AWV)  09/28/2025   DTaP/Tdap/Td (3 - Td or Tdap) 07/27/2034   Pneumococcal Vaccine: 50+ Years  Completed   Influenza Vaccine  Completed   Meningococcal B Vaccine  Aged Out   Hepatitis C Screening  Discontinued        Assessment/Plan:  This is a routine wellness examination for [place].  Patient Care Team: Daryl Setter, NP as PCP - General Nahser, Aleene PARAS, MD (Inactive) as PCP - Cardiology (Cardiology) Brien Belvie BRAVO, MD as Attending Physician (Pulmonary Disease) Carla Milling, RPH-CPP (Pharmacist) Lelon Glendia ONEIDA DEVONNA as Physician Assistant (Cardiology) Meade Verdon RAMAN, MD as Consulting Physician (Pulmonary Disease) Sherren Olam BRAVO, LCSW as Social Worker (Licensed Clinical Social Worker) [name] Lister D as Social Worker Sherrod Sherrod, MD as Consulting Physician (Oncology) Marcey Elspeth PARAS, MD as Consulting Physician (Ophthalmology)  I have personally reviewed and noted the following in the patient's chart:   Medical and social history Use of alcohol, tobacco or illicit drugs  Current medications and supplements including opioid prescriptions. Functional ability and status Nutritional status Physical activity Advanced directives List of other physicians Hospitalizations, surgeries, and ER visits in previous 12 months Vitals Screenings to include cognitive, depression, and falls Referrals and appointments  No orders of the defined types were placed in this encounter.  In addition, I have reviewed  and discussed with patient certain preventive protocols, quality metrics, and best practice recommendations. A written personalized care plan for preventive services as well as general preventive health recommendations were provided to patient.   Lolita Libra, CMA   09/28/2024   Return in 1 year (on 09/28/2025).  After Visit Summary: (MyChart) Due to this being a telephonic visit, the after visit summary with patients personalized plan was offered to patient via MyChart   Nurse Notes: see phone note

## 2024-09-28 NOTE — Telephone Encounter (Signed)
 Pt had AWV today.  We show 14 prescription medications on his current medication list.  He is currently out of Farxiga . (See pharmacy note from yesterday where they are trying to get this shipped to our office instead of to the pt directly.)  Pt stated he only counted 11 medication bottles and had a lot of difficulty trying to read the names of medications on the bottles.  Pt has OV with PCP on 12/2 and I asked him to bring all the bottles with him so we can see what he is missing besides the Farxiga . Pt is also agreeable to getting PCV 20 at next OV. Care Gaps says completed but at risk as last one was 08/09/14.   Pt also states he will be living in his car after today. He can no longer afford the hotel room he is staying in and there is no viable option with family members. I see the social work referral and he states he is still waiting to hear from [facility] and [facility] 8 housing about potential options.  I provided him with contact info for 3 different shelters today.  Pt is asking to have HCPOA removed from his chart. He no longer wants anyone as his HCPOA. I gave him an email address to send a request in writing to [facility] but I am not sure he will be able to complete that.  I have printed a revocation form and given to CMA to assist pt in completing at upcoming visit then we can fax to appropriate department.

## 2024-10-05 ENCOUNTER — Encounter: Payer: Self-pay | Admitting: *Deleted

## 2024-10-05 ENCOUNTER — Ambulatory Visit: Admitting: Family

## 2024-10-06 DIAGNOSIS — I480 Paroxysmal atrial fibrillation: Secondary | ICD-10-CM | POA: Insufficient documentation

## 2024-10-06 NOTE — Assessment & Plan Note (Deleted)
 Patient has a history of recurrent left pleural effusion.  He has been followed by pulmonology.  It is felt that his effusion is related to prior malignancy.***

## 2024-10-06 NOTE — Assessment & Plan Note (Deleted)
 History of CABG in July 2022.  He did have a nuclear stress test prior to back surgery in June 2025.  This was abnormal with inferolateral infarct with partial reversibility and anterior ischemia.  Study was felt to be intermediate risk.  This was reviewed by Dr. Alveta who also saw the patient.  Recommendation was to continue medical therapy.***

## 2024-10-06 NOTE — Progress Notes (Deleted)
 {This patient may be at risk for Amyloid. He has one or more dx on the prob list or PMH from the following list -  Abnormal EKG, HFpEF/Diastolic CHF, Aortic Stenosis, LVH, Bilateral Carpal Tunnel Syndrome, Biceps Tendon Rupture, Spinal Stenosis, Pericardial Effusion, Left Atrial Enlargement, Conduction System Disorder. See list below or review PMH.  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     Persistent atrial fibrillation (HCC) 08/15/2017     SCIATICA, RIGHT 10/09/2010     Thoracic radiculopathy 03/15/2022    Click HERE to open Cardiac Amyloid Screening SmartSet to order screening OR Click HERE to defer testing for 1 year or permanently :1}     OFFICE NOTE:    Date:  10/06/2024  ID:  Robert Bates, DOB [birth year], MRN [id number] PCP: Daryl Setter, NP  [facility] Providers Cardiologist:  Aleene Passe, MD (Inactive) Cardiology APP:  Lelon Glendia DASEN, PA-C { Click to update primary MD,subspecialty MD or APP then REFRESH:1}      *** Patient Profile:   Coronary artery disease S/p CABG 05/2021 (L-LAD, S-OM, S-Dx; LA clipping) C/b  blood loss anemia requiring transfusion, atrial fibrillation with rapid rate and left recurrent pleural effusion - thoracentesis x 2  MPI 04/16/2024: Inferolateral defect in LCx territory (mild infarct versus hibernating myocardium), anterior ischemia; EF 74; intermediate risk>> Med Rx (per Dr. Passe) Paroxysmal atrial fibrillation/flutter   S/p L atral clipping 05/2021 Amiodarone ; Xarelto   Eval by pulmonology in 03/2023 (recurrent pleural effusion) - not felt to be due to Amio Toxicity Admx w AF w RVR in setting of AECOPD c/b acute CHF 07/2023  (HFpEF) heart failure with preserved ejection fraction  TTE 11/20/21: EF 55-60, no RWMA, GRII DD, normal RVSF,  mildly elevated PASP, moderate LAE, mild RAE, pericardial effusion, mild MR, mild to moderate TR TTE 04/08/24: EF 60-65, no RWMA, mild LVH, GRII DD, normal RVSF, normal PASP, mild LAE Pre-CABG dopplers 04/25/21: Bilat ICA 1-39 Peripheral arterial disease  Prior ETOH abuse  S/p cholecystectomy  Aortic atherosclerosis  Thoracic aortic aneurysm CT 6/22: 3.9 cm  Echocardiogram 1/23: 3.6 cm  Abdominal aortic aneurysm  US  5/23: 3.2 cm (repeat 2026) US  01/28/24: 3.9 cm Chronic Obstructive Pulmonary Disease Lung CA, Stage IIB (NSC) PET 01/2023 w/o obvious recurrence of CA  L pleural effusion s/p thoracentesis (Dr. 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 History of Present Illness Robert Bates is a 83 y.o. male for follow-up of CAD, A-fib, CHF.  He was last in the Dr. Passe in June 2025.  He needed clearance for back surgery.  Nuclear stress test demonstrated inferolateral infarct that was partially reversible and anterior ischemia.  It was recommended to continue medical therapy and he was cleared for his back surgery.    ROS-See HPI***    Studies Reviewed:       LABS 08/07/23: TC 142, Trig 81, HDL 79, LDL 47 07/30/24: K 4.2, Na 138, SCr  0.70,eGFR > 60, ALT 7 08/19/24: K 4.2, Na 134, SCr 1.13, eGFR > 60, Hgb 10.1, PLT 263K  RADIOLOGY CT WO Contrast 02/24/24: mod L effusion Results  Risk Assessment/Calculations: {Does this patient have ATRIAL FIBRILLATION?:[phone]} No BP recorded.  {Refresh Note OR Click here to enter BP  :1}***      Physical Exam:  VS:  There were no vitals taken for this visit.       Wt Readings from Last 3 Encounters:  09/28/24 167  lb (75.8 kg)  08/20/24 167 lb (75.8 kg)  08/19/24 171 lb 15.3 oz (78 kg)    Physical Exam***     Assessment and Plan:    Assessment & Plan Coronary artery disease involving native coronary artery of native heart without angina pectoris History of CABG in July 2022.  He did have a nuclear stress test prior to back surgery in June 2025.  This was abnormal with inferolateral infarct with partial reversibility and anterior ischemia.  Study was felt to be intermediate risk.  This was reviewed by Dr. Alveta who also saw the patient.  Recommendation was to continue medical therapy.*** Chronic heart failure with preserved ejection fraction [facility]) Echocardiogram June 2025 with EF 60-65 and moderate diastolic dysfunction.*** PAF (paroxysmal atrial fibrillation) (HCC)  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AAA) without rupture, unspecified part 3.9 cm by ultrasound in March 2025.  He can likely have another ultrasound in 2 to 3 years.*** Pleural effusion Patient has a history of recurrent left pleural effusion.  He has been followed by pulmonology.  It is felt that his effusion is related to prior malignancy.*** Primary hypertension  Pure hypercholesterolemia  Assessment and Plan Assessment & Plan    {      :1}    {Are you ordering a CV Procedure (e.g. stress test, cath, DCCV, TEE, etc)?   Press F2        :[phone]}  Dispo:  No follow-ups on file.  Signed, Glendia Ferrier, PA-C

## 2024-10-06 NOTE — Assessment & Plan Note (Deleted)
 Echocardiogram June 2025 with EF 60-65 and moderate diastolic dysfunction.***

## 2024-10-06 NOTE — Assessment & Plan Note (Deleted)
 3.9 cm by ultrasound in March 2025.  He can likely have another ultrasound in 2 to 3 years.***

## 2024-10-07 NOTE — Telephone Encounter (Signed)
 I have a phone appointment with patient 12/10 - will try to conference call with him and change delivery address for [place] and [place]

## 2024-10-08 ENCOUNTER — Ambulatory Visit: Attending: Physician Assistant | Admitting: Physician Assistant

## 2024-10-08 DIAGNOSIS — I480 Paroxysmal atrial fibrillation: Secondary | ICD-10-CM

## 2024-10-08 DIAGNOSIS — J90 Pleural effusion, not elsewhere classified: Secondary | ICD-10-CM

## 2024-10-08 DIAGNOSIS — I714 Abdominal aortic aneurysm, without rupture, unspecified: Secondary | ICD-10-CM

## 2024-10-08 DIAGNOSIS — E7800 Pure hypercholesterolemia, unspecified: Secondary | ICD-10-CM

## 2024-10-08 DIAGNOSIS — I5032 Chronic diastolic (congestive) heart failure: Secondary | ICD-10-CM

## 2024-10-08 DIAGNOSIS — I10 Essential (primary) hypertension: Secondary | ICD-10-CM

## 2024-10-08 DIAGNOSIS — I2510 Atherosclerotic heart disease of native coronary artery without angina pectoris: Secondary | ICD-10-CM

## 2024-10-09 ENCOUNTER — Other Ambulatory Visit: Payer: Self-pay | Admitting: Family

## 2024-10-09 DIAGNOSIS — I480 Paroxysmal atrial fibrillation: Secondary | ICD-10-CM

## 2024-10-11 ENCOUNTER — Encounter: Payer: Self-pay | Admitting: Physician Assistant

## 2024-10-12 ENCOUNTER — Telehealth (HOSPITAL_BASED_OUTPATIENT_CLINIC_OR_DEPARTMENT_OTHER): Payer: Self-pay | Admitting: *Deleted

## 2024-10-12 NOTE — Telephone Encounter (Signed)
   Name: Robert Bates  DOB: [birth year]  MRN: [id number]  Primary Cardiologist: Aleene Passe, MD (Inactive)  Chart reviewed as part of pre-operative protocol coverage. Because of Ihsan Seipp's past medical history and time since last visit, he will require a follow-up in-office visit in order to better assess preoperative cardiovascular risk.  Pre-op covering staff: - Please schedule appointment and call patient to inform them. If patient already had an upcoming appointment within acceptable timeframe, please add pre-op clearance to the appointment notes so provider is aware. - Please contact requesting surgeon's office via preferred method (i.e, phone, fax) to inform them of need for appointment prior to surgery.  This message will also be routed to pharmacy pool for input on holding Xarelto  as requested below so that this information is available to the clearing provider at time of patient's appointment.   Haylie Mccutcheon D Enmanuel Zufall, NP  10/12/2024, 4:12 PM

## 2024-10-12 NOTE — Telephone Encounter (Signed)
   Pre-operative Risk Assessment    Patient Name: Robert Bates  DOB: August 08, 1941 MRN: [id number]   Date of last office visit: 04/26/24 DR. NAHSER Date of next office visit: NONE   Request for Surgical Clearance    Procedure:  MINIMALLY INVASIVE LUMBAR DECOMPRESSION (MILD)  Date of Surgery:  Clearance TBD                                Surgeon:  DR. PATEL Surgeon's Group or Practice [facility] Phone number:  [phone] Fax number:  [phone] ATTN: HALI, RN   Type of Clearance Requested:   - Medical  - Pharmacy:  Hold Rivaroxaban  (Xarelto ) x 3 DAYS PRIOR; RESUME 24 HOURS POST OP  DOES PT NEED LOVENOX    Type of Anesthesia:  Not Indicated; LEFT MESSAGE TO CALL BACK WITH TYPE OF ANESTHESIA TO BE USED   Additional requests/questions:    Bonney Niels Jest   10/12/2024, 3:56 PM

## 2024-10-12 NOTE — Telephone Encounter (Signed)
 Pt has been scheduled in office appt for preop clearance. Pt wanted to be scheduled with Jon Hails, PAC or Glendia Ferrier, PAC.appt 11/09/24 Jon Hails, PAC. Pt aware of the [employer] address. I will update all parties involved.

## 2024-10-13 ENCOUNTER — Telehealth: Payer: Self-pay | Admitting: Cardiovascular Disease

## 2024-10-13 ENCOUNTER — Other Ambulatory Visit: Admitting: Pharmacist

## 2024-10-13 ENCOUNTER — Other Ambulatory Visit (HOSPITAL_BASED_OUTPATIENT_CLINIC_OR_DEPARTMENT_OTHER): Payer: Self-pay

## 2024-10-13 MED ORDER — DAPAGLIFLOZIN PROPANEDIOL 5 MG PO TABS
5.0000 mg | ORAL_TABLET | Freq: Every day | ORAL | 1 refills | Status: AC
  Filled 2024-10-13: qty 30, 30d supply, fill #0

## 2024-10-13 NOTE — Telephone Encounter (Signed)
 AddendumBETHA Bard from [facility] calling to notify that they will be using [facility]. Please advise.

## 2024-10-13 NOTE — Progress Notes (Signed)
 10/13/2024 Name: Fredrico Beedle MRN: [id number] DOB: [birth year]  Chief Complaint  Patient presents with   Medication Management   Medication Adherence    Jedediah Noda is a 83 y.o. year old male who presented for a phone visit   They were referred to the pharmacist by their PCP for assistance in managing complex medication management.    Subjective: Patient is back living with his niece. He asks if we can get his delivery address changed for [employer] [facility] (was being delivered to his sister in laws house but he would like changed to his home) .  Patient states he has lost the replacement bottle of Farxiga  5mg  that [employer] mailed out to him and has not taken in 1 or 2 weeks. His niece states he was taken off Farxiga  and told to take glipizide  but on his last office visit with PCP he was to take Farxiga  and glipizide  has been discontinued.  His niece also reports he was taking spironolactone  25mg  tablets and 50mg  tablets until she realized about 4 or 5 days ago. She has removed the spironolactone  50mg  bottle so he will no long take this strength.   Patient reports the only medication he needs refilled right now is Farxiga .   Mr. Seliga asked about a medication for his nerves and a pill for shortness of breath. I think he is referring to lorazepam  which has been discontinued.  He also asks what he can take for nerve pain. He is currently taking gabapentin  100mg  3 times a day. He was seen by Dr Tobie who is considering a procedure to help with back and nerve pain. Dr Tobie is consulting with cardiology for clearance for procedure.   Care Team: Primary Care Provider: Daryl Setter, NP ; Next Scheduled Visit: 10/22/2024  Medication Access/Adherence  Current Pharmacy:  [facility] [place] [facility] [address], [address] [place] [address] Phone: [phone] Fax: [phone]  [facility] - [place], [place] - [address] [place] [address]  [place] [address] Phone: [phone] Fax: [phone]  [facility] [address], [place] - [address] [place] [address] Phone: [phone] Fax: [phone]  [facility] [address], [place] - [address] [place] [address] Phone: [phone] Fax: [phone]   Patient reports affordability concerns with their medications: Yes  Patient reports access/transportation concerns to their pharmacy: No  Patient reports adherence concerns with their medications:  Yes     Objective:  Lab Results  Component Value Date   HGBA1C 6.2 (H) 06/18/2024    Lab Results  Component Value Date   CREATININE 1.13 08/19/2024   BUN 28 (H) 08/19/2024   NA 134 (L) 08/19/2024   K 4.2 08/19/2024   CL 100 08/19/2024   CO2 22 08/19/2024    Lab Results  Component Value Date   CHOL 142 08/07/2023   HDL 79 08/07/2023   LDLCALC 47 08/07/2023   LDLDIRECT 142.2 12/26/2008   TRIG 81 08/07/2023   CHOLHDL 1.8 08/07/2023    Medications Reviewed Today     Reviewed by Carla Milling, RPH-CPP (Pharmacist) on 10/13/24 at 1346  Med List Status: <None>   Medication Order Taking? Sig Documenting Provider Last Dose Status Informant  acetaminophen  (TYLENOL ) 500 MG tablet [id number] Yes Take 1-2 tablets (500-1,000 mg total) by mouth every 6 (six) hours as needed. Dwan Kyla HERO, PA-C  Active Self           Med Note JUSTINO, Kawanna Christley B   Wed Jul 16, 2023 11:25 AM)    amiodarone  (PACERONE ) 200 MG tablet [id number] Yes Take 1 tablet (200 mg total) by mouth daily. Nahser, Aleene PARAS, MD  Active Self  atorvastatin  (LIPITOR ) 80 MG tablet [id number] Yes Take 1 tablet (80 mg total) by mouth daily. Daryl Setter, NP  Active Self  budesonide -glycopyrrolate -formoterol  (BREZTRI  AEROSPHERE) 160-9-4.8 MCG/ACT AERO inhaler [id number] Yes Inhale 2 puffs into the lungs 2 (two) times daily. Daryl Setter, NP  Active Self   Cyanocobalamin (VITAMIN B-12 CR PO) [id number]  Take 1 tablet by mouth daily. [provider]  Active   dapagliflozin  propanediol (FARXIGA ) 5 MG TABS tablet [id number] Yes Take 5 mg by mouth daily. Gets from assistance program [provider]  Active   diclofenac  Sodium (VOLTAREN ) 1 % GEL [id number]  Apply 4 g topically 4 (four) times daily. Emil Share, DO  Active   diltiazem  (CARDIZEM  CD) 120 MG 24 hr capsule [id number] Yes Take 1 capsule (120 mg total) by mouth daily. Daryl Setter, NP  Active Self  furosemide  (LASIX ) 40 MG tablet [id number] Yes Take 1 tablet (40 mg total) by mouth daily. Daryl Setter, NP  Active   gabapentin  (NEURONTIN ) 100 MG capsule [id number] Yes Take 1 capsule (100 mg total) by mouth 3 (three) times daily. Daryl Setter, NP  Active   levalbuterol  (XOPENEX ) 0.63 MG/3ML nebulizer solution [id number]  Take 3 mLs (0.63 mg total) by nebulization every 4 (four) hours as needed for wheezing or shortness of breath. Daryl Setter, NP  Active Self  lisinopril  (ZESTRIL ) 2.5 MG tablet [id number] Yes Take 1 tablet (2.5 mg total) by mouth daily. Daryl Setter, NP  Active   omeprazole  (PRILOSEC) 40 MG capsule [id number] Yes Take 1 capsule (40 mg total) by mouth daily. Daryl Setter, NP  Active Self  ondansetron  (ZOFRAN ) 4 MG tablet [id number]  Take 1 tablet (4 mg total) by mouth every 8 (eight) hours as needed for nausea or vomiting. Daryl Setter, NP  Active   potassium chloride  (KLOR-CON ) 10 MEQ tablet [id number] Yes Take 10 mEq by mouth daily. [provider]  Active   sertraline  (ZOLOFT ) 50 MG tablet [id number]  Take 1 tablet (50 mg total) by mouth daily.  Patient not taking: Reported on 10/13/2024   Daryl Setter, NP  Active Self  spironolactone  (ALDACTONE ) 25 MG tablet [id number] Yes Take 1 tablet (25 mg total) by mouth daily. Daryl Setter, NP  Active   tamsulosin  (FLOMAX ) 0.4 MG CAPS capsule [id number] Yes Take 1  capsule (0.4 mg total) by mouth at bedtime. Daryl Setter, NP  Active   XARELTO  20 MG TABS tablet [id number] Yes TAKE 1 TABLET BY MOUTH EVERY DAY Daryl Setter, NP  Active   Med List Note Deleta Debby SAUNDERS, [facility] 07/19/24 1335): Meds handled by Erminio Merritts, niece, [phone]                 Assessment/Plan:   Medication Management: - Reviewed current medication list with patient and his niece. Discussed how to take each medication and what they were for.  Mailed current medication list to patient and wrote reason for taking each medication on the printout.  - He is due to have several medications filled but his niece states that only med he needs right now is Farxiga . Unable to request refill from [employer] yet (they have send 2 bottles of 90 days in the last  2 months but patient has misplaced them). Sent in Rx to [facility].  - Patient and niece instructed to stop glipizide .  - Discussed that sertraline  is on his med list and can be helpful for anxiety. He will discuss with PCP at appt 10/22/2024 - Encouraged him to continue gabapentin  100mg  up to 3 times a day. Discussed that Dr Tobie is working on clearance for his procedure.    Follow Up Plan: 2 to 3 weeks.    Madelin Ray, PharmD Clinical Pharmacist [facility]

## 2024-10-13 NOTE — Telephone Encounter (Signed)
 Danyella from [facility] calling to notify that they will be using [facility]. Please advise.

## 2024-10-19 NOTE — Telephone Encounter (Signed)
 Patient with diagnosis of aifb on Xarelto  for anticoagulation.    Procedure: MINIMALLY INVASIVE LUMBAR DECOMPRESSION (MILD)  Date of procedure: TBD   CHA2DS2-VASc Score = 4   This indicates a 4.8% annual risk of stroke. The patient's score is based upon: CHF History: 0 HTN History: 1 Diabetes History: 0 Stroke History: 0 Vascular Disease History: 1 Age Score: 2 Gender Score: 0      CrCl 66 ml/min Platelet count 246  Patient has not had an Afib/aflutter ablation in the last 3 months, DCCV within the last 4 weeks or a watchman implanted in the last 45 days   Per office protocol, patient can hold Xarelto  for 3 days prior to procedure.    Patient will NOT need bridging with Lovenox  (enoxaparin ) around procedure.  **This guidance is not considered finalized until pre-operative APP has relayed final recommendations.**

## 2024-10-20 ENCOUNTER — Other Ambulatory Visit (HOSPITAL_BASED_OUTPATIENT_CLINIC_OR_DEPARTMENT_OTHER): Payer: Self-pay

## 2024-10-22 ENCOUNTER — Ambulatory Visit: Admitting: Family

## 2024-10-25 ENCOUNTER — Other Ambulatory Visit

## 2024-10-25 ENCOUNTER — Other Ambulatory Visit (HOSPITAL_BASED_OUTPATIENT_CLINIC_OR_DEPARTMENT_OTHER): Payer: Self-pay

## 2024-10-27 ENCOUNTER — Telehealth: Payer: Self-pay

## 2024-10-27 NOTE — Telephone Encounter (Signed)
 Unfortunately, legally I am not allowed to be his POA because I am his medical provider.

## 2024-10-27 NOTE — Telephone Encounter (Signed)
 Copied from CRM #[id number]. Topic: Clinical - Medical Advice >> Oct 27, 2024 11:14 AM Viola FALCON wrote: Reason for CRM: Patient called wanting to know if Eleanor Ponto will be his power of attorney since his wife has passed. Please call him at  [phone]

## 2024-10-29 NOTE — Telephone Encounter (Signed)
 Copied from CRM [id number]. Topic: General - Other >> Oct 29, 2024  1:51 PM Drema MATSU wrote: Reason for CRM: Patient has questions regarding his niece being POA. He stated that he doesn't want her to be POA. He is wanting Melissa to be POA. Please call patient.

## 2024-11-01 ENCOUNTER — Other Ambulatory Visit: Payer: Self-pay | Admitting: Licensed Clinical Social Worker

## 2024-11-01 NOTE — Telephone Encounter (Signed)
 Patient notified of this information and to contact us  for follow up once he is out of the hospital.

## 2024-11-03 ENCOUNTER — Other Ambulatory Visit (HOSPITAL_BASED_OUTPATIENT_CLINIC_OR_DEPARTMENT_OTHER): Payer: Self-pay

## 2024-11-04 NOTE — Discharge Summary (Signed)
 "  Hospital Medicine Discharge Summary   Demographics: Robert Bates  84 y.o. 15-Aug-1941 MRN: [id number]    Extended Emergency Contact Information Primary Emergency Contact: Cullipher,Brenda Mobile Phone: [phone] Relation: Niece  Full Code  Admit Date: 10/18/2024                            Attending Physician: Jennings [facility]* Discharge Date: 11/04/2024  Primary Care Provider: Eleanor Leretha Ponto, FNP   [phone]  Consults during this admission: Consult Orders             IP CONSULT TO HEMATOLOGY       Specialty:  Hematology  Provider:  (Not yet assigned)      IP CONSULT TO PULMONOLOGY       Specialty:  Pulmonary Disease  Provider:  (Not yet assigned)      IP CONSULT TO CARDIOLOGY       Specialty:  Cardiology  Provider:  (Not yet assigned)      IP CONSULT TO HOSPITALIST       Provider:  (Not yet assigned)             Active & Resolved Diagnosis: Principal Problem:   Influenza A Active Problems:   Acute on chronic heart failure with preserved ejection fraction (HFpEF) (CMD)   Lung cancer    (CMD)   Low back pain   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 *  Disposition: Patient discharged to Home with Home Health Care in fair condition.  Discharge follow-up recommendations : See Hospital Course   Hospital Course: Draft Hospital Course  84 year old male with a PMH significant for CAD s/p CABG, persistent A-fib, HFpEF, COPD, alcohol use disorder, AAA, HTN, DM, diverticulitis, GERD, meningioma, seizure disorder, hiatal hernia, COVID-19, chronic  back pain, LLL lung cancer s/p radiation and repeated thoracocenteses for recurrent left pleural effusion, as reviewed in EMR, has had a prolonged hospital stay, initially presented around 10/18/2024 with generalized weakness, chills, SOB, hypoxia requiring 2L [place], diagnosed with influenza A and CAP with COPD exacerbation, but since then his hospital stay has been complicated by CHF exacerbation and RVR, and concerns for an acute PE.  Assessment & Plan Influenza A Patient completed course of Tamiflu on 12/20  Chest pain Pulmonary embolism (CMD) - Patient with episodes of left-sided chest pain overnight, unclear if this is secondary to history of lung cancer versus any new etiology - Patient with presentation of shortness of breath as well as increased heart rate - Added D-dimer to rule out any concerns for PE, noted to be significantly elevated, added VQ scan and lower extremity bilateral duplex to rule out DVT - EKG was obtained and found to be unremarkable for acute ischemic changes - noted atrial flutter/fibrillation  - Initial troponin mildly elevated at 22 -VQ scan concerning for PE in the right lower lobe lateral segment -Xarelto  was switched to Eliquis  per hematology recommendations  Lung cancer    (CMD) Chronic left pleural  effusion Patient has a history of left-sided lung cancer and is status post chemoradiation.  Imaging demonstrates chronic volume loss of the left hemithorax, likely resulting in the transudative effusion.   Patient will follow-up with oncology Low back pain Continue analgesic medication Acute on chronic heart failure with preserved ejection fraction (HFpEF) (CMD) Continue daily Lasix  infusion  -Transitioned to PO Lasix   Pleural effusion Patient had thoracentesis performed 12/26 with 1.1 L fluid removal.   Cultures obtained with no growth Pathophysiology as above  COPD exacerbation    (CMD) Centrilobular emphysema (CMD) Oxygen  saturation 96-99 percent on room  air.   Continue scheduled and as needed breathing treatments, steroid and antibiotic. -Patient has completed course of antibiotics,  - Appreciate pulmonology input: Continue ICS/LABA/LAMA, as needed albuterol , prednisone  taper - followed by [facility] in [facility] as an outpatient  Acute on chronic respiratory failure with hypoxia    (CMD) - Now weaned to room air Hospital-acquired pneumonia Completed course of antibiotic therapy Persistent atrial fibrillation with RVR   (CMD) - Several days ago was on 60 mg, then increased to 120, and later 180 (missed several doses due to not meeting parameters), increased to 240 mg but before receiving it went into RVR that was given a dose of Lopressor  25 mg, followed by Cardizem  45 mg every 6. - Will continue on the latest dose, to improve his chances of getting it based on parameters. - 180 mg Cardizem  at discharge     At bedside he is awake and alert in no apparent distress.  Irregular irregular rhythm, mild tachycardia.    Unfortunately the patient is homeless, but he has a car, states he has nowhere to go to discharge.  Case manager providing homeless shelter information.  Family reportedly cannot take him in because they are sick; hopefully they will do so when they recover.  Wound / Incision Assessment: Refer to Chart Review and Media Tab for images if available.     Vital Sign Range:  Temp:  [97.4 F (36.3 C)-97.7 F (36.5 C)] 97.4 F (36.3 C) Heart Rate:  [94-121] 121 Resp:  [18-22] 22 BP: (92-106)/(58-78) 106/75  Anticoagulant Medications     Direct Factor Xa Inhibitors Start End   * apixaban  (ELIQUIS ) 5 mg tab 11/04/2024 02/07/2025   Sig - Route: Take 2 tablets (10 mg total) by mouth 2 (two) times a day for 5 days, THEN 1 tablet (5 mg total) 2 (two) times a day. - oral   Renewals     Renewal requests to authorizing provider [facility] Graciela Keeler, MD) <b>prohibited</b>        * rivaroxaban  (XARELTO ) 20 mg tablet  08/13/2022 --   Class: Historical Med         Discharge Medications     PAUSE taking these medications      Sig Disp Refill Start End  lisinopriL  2.5 mg tablet Wait to take this until your doctor or other care provider tells you to start again. Commonly known as: PRINIVIL   Take 2.5 mg by mouth daily.   0     spironolactone  25 mg tablet Wait to take this until your doctor or other care provider tells you to start again. Commonly known as: ALDACTONE   Take 25 mg by mouth daily.   0         New Medications      Sig Disp Refill Start End  apixaban  5 mg Tab Commonly known as: ELIQUIS   Take 2 tablets (10 mg total) by  mouth 2 (two) times a day for 5 days, THEN 1 tablet (5 mg total) 2 (two) times a day.  200 tablet  0  November 04, 2024        Modified Medications      Sig Disp Refill Start End  dilTIAZem  180 mg 24 hr capsule Commonly known as: CARDIZEM  CD What changed:  medication strength how much to take  Take 1 capsule (180 mg total) by mouth daily.  90 capsule  0     furosemide  20 mg tablet Commonly known as: LASIX  What changed:  medication strength how much to take  Take 1 tablet (20 mg total) by mouth daily.  90 tablet  0  November 05, 2024        Medications To Continue      Sig Disp Refill Start End  acetaminophen  500 mg tablet Commonly known as: TYLENOL   Take 500 mg by mouth every 6 (six) hours as needed for mild pain (1-3).   0     albuterol  HFA 90 mcg/actuation inhaler Commonly known as: PROVENTIL  HFA;VENTOLIN  HFA;PROAIR  HFA  Inhale 2 puffs every 6 (six) hours.   0     amiodarone  200 mg tablet Commonly known as: PACERONE   Take 200 mg by mouth daily.   0     atorvastatin  80 mg tablet Commonly known as: LIPITOR   Take 80 mg by mouth daily.   0     Breztri  Aerosphere 160-9-4.8 mcg/actuation inhaler Generic drug: budesonide -glycopyr-formoterol   Inhale 2 puffs 2 (two) times a day.   0     dapagliflozin  propanediol 5 mg Tab  tablet Commonly known as: FARXIGA   Take 5 mg by mouth daily.   0     gabapentin  100 mg capsule Commonly known as: NEURONTIN   Take 100 mg by mouth 3 (three) times a day.   0     levalbuterol  0.63 mg/3 mL nebulizer solution Commonly known as: XOPENEX   Inhale 0.63 mg every 4 (four) hours as needed.   0     omeprazole  40 mg DR capsule Commonly known as: PriLOSEC  Take 40 mg by mouth daily.   0     ondansetron  4 mg tablet Commonly known as: ZOFRAN   Take 4 mg by mouth every 8 (eight) hours as needed for nausea or vomiting.   0     potassium chloride  10 mEq ER tablet Commonly known as: KLOR-CON   Take 10 mEq by mouth daily.   0     sertraline  50 mg tablet Commonly known as: ZOLOFT   Take 50 mg by mouth daily.   0     tamsulosin  0.4 mg Cap Commonly known as: FLOMAX   Take 0.4 mg by mouth nightly.   0     traMADoL  50 mg tablet Commonly known as: ULTRAM   Take 50 mg by mouth 2 (two) times a day as needed.   0         Stopped Medications    ascorbic acid (vitamin C) 550 mg/1.1 gram (scoop) Powd   clotrimazole -betamethasone  1-0.05 % cream Commonly known as: LOTRISONE    fluticasone  propionate 50 mcg/spray nasal spray Commonly known as: FLONASE    rivaroxaban  20 mg tablet Commonly known as: XARELTO        Discharge Orders     Home Health Skilled Nursing     Details:    Home Health Skilled Nursing Service: Education   Education Needed:  Disease Process Education Comment - CHF and High Readmission Rate Other (see comment)  Physical Therapy Home Health Coordination     Details:    Actions: Evaluate and Treat   Walker     Details:    Height (in inches): 1.753 m (5' 9)   Weight (in lbs.): 77.9 kg (171 lb 11.8 oz)   Walker Options: Rollator with seat and brakes   Length of need: Lifetime       Speech Therapy Recommendations: Rehab Potential Rehab Potential: Good Impairments/Limitations: No Impairments Rehab Potential Rehab Potential:  Good     Lab Results  Component Value Date/Time   HGB 9.0 (L) 11/04/2024 12:48 AM   HCT 28.3 (L) 11/04/2024 12:48 AM   WBC 11.61 (H) 11/04/2024 12:48 AM   PLT 418 11/04/2024 12:48 AM   Lab Results  Component Value Date/Time   NA 137 11/04/2024 12:48 AM   K 4.2 11/04/2024 12:48 AM   CREATININE 0.58 (L) 11/04/2024 12:48 AM   BUN 24 11/04/2024 12:48 AM   GLUCOSE 152 (H) 11/04/2024 12:48 AM    Pertinent Imaging: XR Chest 1 View  Final Result by Toribio Oneil Lee, MD (12/15 1834)  Date of Service: 2024-10-18 17:21:00    EXAM:  XR Chest, 1 View.     CLINICAL HISTORY:  Shortness of Breath.    COMPARISON:  CR/PR - XR CHEST 1 VIEW - 03/22/2024 10:14 PM EDT     FINDINGS:    LUNGS AND PLEURA:  Chronic volume loss of left hemithorax with triangular parenchymal density   extending from left hilum to the lateral pleural surface with lateral   pleural thickening similar to prior exam.  Right lung is unremarkable.  No pleural effusion or pneumothorax.     HEART AND MEDIASTINUM:  Cardiac silhouette is within limits.  Calcified ectatic thoracic aorta   again noted.  Prior surgical changes of sternotomy, CABG, and left atrial appendage   banding.  BONES:  No acute osseous abnormality.     IMPRESSION:    1.  Surgical changes of sternotomy, CABG, left atrial appendage banding.  2.  Chronic volume loss of left hemithorax with triangular parenchymal   consolidation/density extending from left mid hilum/midlung.    Electronically signed by: Toribio CHRISTELLA Lee, MD on 10/18/2024 06:34:54 PM   US [place]      Predictive Model Details        27.7% (High)  Factor Value   Calculated 11/04/2024 12:04 8% Latest hemoglobin in last 72 hrs 9 g/dL   Readmission Risk Score v2 Model 8% Number of hospitalizations in last year 0    7% Number of active outpatient medication orders 22    6% Braden score 19    6% Latest BUN in last 72 hrs 24 mg/dL     Electronically signed by: Jennings Graciela Keeler, MD 11/04/2024 1:50 PM   Time spent on discharge: 40 minutes  *Some images could not be shown."

## 2024-11-05 ENCOUNTER — Encounter (HOSPITAL_BASED_OUTPATIENT_CLINIC_OR_DEPARTMENT_OTHER): Payer: Self-pay

## 2024-11-05 ENCOUNTER — Ambulatory Visit: Payer: Self-pay

## 2024-11-05 ENCOUNTER — Inpatient Hospital Stay (HOSPITAL_BASED_OUTPATIENT_CLINIC_OR_DEPARTMENT_OTHER)
Admission: EM | Admit: 2024-11-05 | Discharge: 2024-11-12 | DRG: 291 | Disposition: A | Discharge status: Skilled Nursing Facility | Source: Other Acute Inpatient Hospital | Attending: Internal Medicine | Admitting: Internal Medicine

## 2024-11-05 ENCOUNTER — Other Ambulatory Visit: Payer: Self-pay

## 2024-11-05 ENCOUNTER — Telehealth: Payer: Self-pay

## 2024-11-05 ENCOUNTER — Emergency Department (HOSPITAL_BASED_OUTPATIENT_CLINIC_OR_DEPARTMENT_OTHER)

## 2024-11-05 DIAGNOSIS — E7800 Pure hypercholesterolemia, unspecified: Secondary | ICD-10-CM | POA: Diagnosis present

## 2024-11-05 DIAGNOSIS — D320 Benign neoplasm of cerebral meninges: Secondary | ICD-10-CM | POA: Diagnosis present

## 2024-11-05 DIAGNOSIS — G8929 Other chronic pain: Secondary | ICD-10-CM | POA: Diagnosis present

## 2024-11-05 DIAGNOSIS — E1151 Type 2 diabetes mellitus with diabetic peripheral angiopathy without gangrene: Secondary | ICD-10-CM | POA: Diagnosis present

## 2024-11-05 DIAGNOSIS — Z806 Family history of leukemia: Secondary | ICD-10-CM

## 2024-11-05 DIAGNOSIS — E871 Hypo-osmolality and hyponatremia: Secondary | ICD-10-CM

## 2024-11-05 DIAGNOSIS — Z79899 Other long term (current) drug therapy: Secondary | ICD-10-CM

## 2024-11-05 DIAGNOSIS — Z825 Family history of asthma and other chronic lower respiratory diseases: Secondary | ICD-10-CM

## 2024-11-05 DIAGNOSIS — Y95 Nosocomial condition: Secondary | ICD-10-CM | POA: Diagnosis present

## 2024-11-05 DIAGNOSIS — I10 Essential (primary) hypertension: Secondary | ICD-10-CM | POA: Diagnosis present

## 2024-11-05 DIAGNOSIS — N401 Enlarged prostate with lower urinary tract symptoms: Secondary | ICD-10-CM | POA: Diagnosis present

## 2024-11-05 DIAGNOSIS — I2510 Atherosclerotic heart disease of native coronary artery without angina pectoris: Secondary | ICD-10-CM | POA: Diagnosis present

## 2024-11-05 DIAGNOSIS — J449 Chronic obstructive pulmonary disease, unspecified: Secondary | ICD-10-CM | POA: Diagnosis present

## 2024-11-05 DIAGNOSIS — Z923 Personal history of irradiation: Secondary | ICD-10-CM

## 2024-11-05 DIAGNOSIS — Z66 Do not resuscitate: Secondary | ICD-10-CM | POA: Diagnosis present

## 2024-11-05 DIAGNOSIS — E119 Type 2 diabetes mellitus without complications: Secondary | ICD-10-CM

## 2024-11-05 DIAGNOSIS — Z7901 Long term (current) use of anticoagulants: Secondary | ICD-10-CM

## 2024-11-05 DIAGNOSIS — Z823 Family history of stroke: Secondary | ICD-10-CM

## 2024-11-05 DIAGNOSIS — J90 Pleural effusion, not elsewhere classified: Secondary | ICD-10-CM

## 2024-11-05 DIAGNOSIS — J9601 Acute respiratory failure with hypoxia: Secondary | ICD-10-CM | POA: Diagnosis present

## 2024-11-05 DIAGNOSIS — F419 Anxiety disorder, unspecified: Secondary | ICD-10-CM | POA: Diagnosis present

## 2024-11-05 DIAGNOSIS — Z6824 Body mass index (BMI) 24.0-24.9, adult: Secondary | ICD-10-CM

## 2024-11-05 DIAGNOSIS — G40909 Epilepsy, unspecified, not intractable, without status epilepticus: Secondary | ICD-10-CM

## 2024-11-05 DIAGNOSIS — I2699 Other pulmonary embolism without acute cor pulmonale: Secondary | ICD-10-CM

## 2024-11-05 DIAGNOSIS — I4891 Unspecified atrial fibrillation: Principal | ICD-10-CM

## 2024-11-05 DIAGNOSIS — J439 Emphysema, unspecified: Secondary | ICD-10-CM | POA: Diagnosis present

## 2024-11-05 DIAGNOSIS — E1165 Type 2 diabetes mellitus with hyperglycemia: Secondary | ICD-10-CM | POA: Diagnosis present

## 2024-11-05 DIAGNOSIS — Z7984 Long term (current) use of oral hypoglycemic drugs: Secondary | ICD-10-CM

## 2024-11-05 DIAGNOSIS — I5033 Acute on chronic diastolic (congestive) heart failure: Secondary | ICD-10-CM | POA: Diagnosis present

## 2024-11-05 DIAGNOSIS — Z7951 Long term (current) use of inhaled steroids: Secondary | ICD-10-CM

## 2024-11-05 DIAGNOSIS — Z8249 Family history of ischemic heart disease and other diseases of the circulatory system: Secondary | ICD-10-CM

## 2024-11-05 DIAGNOSIS — Z888 Allergy status to other drugs, medicaments and biological substances status: Secondary | ICD-10-CM

## 2024-11-05 DIAGNOSIS — I480 Paroxysmal atrial fibrillation: Secondary | ICD-10-CM | POA: Diagnosis present

## 2024-11-05 DIAGNOSIS — Z951 Presence of aortocoronary bypass graft: Secondary | ICD-10-CM

## 2024-11-05 DIAGNOSIS — I5032 Chronic diastolic (congestive) heart failure: Secondary | ICD-10-CM | POA: Diagnosis present

## 2024-11-05 DIAGNOSIS — I444 Left anterior fascicular block: Secondary | ICD-10-CM | POA: Diagnosis present

## 2024-11-05 DIAGNOSIS — Z86711 Personal history of pulmonary embolism: Secondary | ICD-10-CM

## 2024-11-05 DIAGNOSIS — C3432 Malignant neoplasm of lower lobe, left bronchus or lung: Secondary | ICD-10-CM | POA: Diagnosis present

## 2024-11-05 DIAGNOSIS — Z91041 Radiographic dye allergy status: Secondary | ICD-10-CM

## 2024-11-05 DIAGNOSIS — I714 Abdominal aortic aneurysm, without rupture, unspecified: Secondary | ICD-10-CM | POA: Diagnosis present

## 2024-11-05 DIAGNOSIS — Z5901 Sheltered homelessness: Secondary | ICD-10-CM

## 2024-11-05 DIAGNOSIS — I110 Hypertensive heart disease with heart failure: Principal | ICD-10-CM | POA: Diagnosis present

## 2024-11-05 DIAGNOSIS — K219 Gastro-esophageal reflux disease without esophagitis: Secondary | ICD-10-CM | POA: Diagnosis present

## 2024-11-05 DIAGNOSIS — E1142 Type 2 diabetes mellitus with diabetic polyneuropathy: Secondary | ICD-10-CM | POA: Diagnosis present

## 2024-11-05 DIAGNOSIS — J189 Pneumonia, unspecified organism: Secondary | ICD-10-CM

## 2024-11-05 DIAGNOSIS — Z87891 Personal history of nicotine dependence: Secondary | ICD-10-CM

## 2024-11-05 DIAGNOSIS — R627 Adult failure to thrive: Secondary | ICD-10-CM | POA: Diagnosis present

## 2024-11-05 DIAGNOSIS — Z8616 Personal history of COVID-19: Secondary | ICD-10-CM

## 2024-11-05 DIAGNOSIS — J440 Chronic obstructive pulmonary disease with acute lower respiratory infection: Secondary | ICD-10-CM | POA: Diagnosis present

## 2024-11-05 LAB — COMPREHENSIVE METABOLIC PANEL WITH GFR
ALT: 20 U/L (ref 0–44)
AST: 19 U/L (ref 15–41)
Albumin: 3.3 g/dL — ABNORMAL LOW (ref 3.5–5.0)
Alkaline Phosphatase: 92 U/L (ref 38–126)
Anion gap: 15 (ref 5–15)
BUN: 30 mg/dL — ABNORMAL HIGH (ref 8–23)
CO2: 25 mmol/L (ref 22–32)
Calcium: 9 mg/dL (ref 8.9–10.3)
Chloride: 98 mmol/L (ref 98–111)
Creatinine, Ser: 0.85 mg/dL (ref 0.61–1.24)
GFR, Estimated: >60 mL/min
Glucose, Bld: 211 mg/dL — ABNORMAL HIGH (ref 70–99)
Potassium: 4.3 mmol/L (ref 3.5–5.1)
Sodium: 138 mmol/L (ref 135–145)
Total Bilirubin: 0.3 mg/dL (ref 0.0–1.2)
Total Protein: 5.8 g/dL — ABNORMAL LOW (ref 6.5–8.1)

## 2024-11-05 LAB — URINALYSIS, ROUTINE W REFLEX MICROSCOPIC
Bilirubin Urine: NEGATIVE
Glucose, UA: NEGATIVE mg/dL
Ketones, ur: NEGATIVE mg/dL
Leukocytes,Ua: NEGATIVE
Nitrite: NEGATIVE
Protein, ur: NEGATIVE mg/dL
Specific Gravity, Urine: 1.015 (ref 1.005–1.030)
pH: 6 (ref 5.0–8.0)

## 2024-11-05 LAB — CBC WITH DIFFERENTIAL/PLATELET
Abs Immature Granulocytes: 0.43 K/uL — ABNORMAL HIGH (ref 0.00–0.07)
Basophils Absolute: 0 K/uL (ref 0.0–0.1)
Basophils Relative: 0 %
Eosinophils Absolute: 0.1 K/uL (ref 0.0–0.5)
Eosinophils Relative: 0 %
HCT: 36 % — ABNORMAL LOW (ref 39.0–52.0)
Hemoglobin: 11 g/dL — ABNORMAL LOW (ref 13.0–17.0)
Immature Granulocytes: 3 %
Lymphocytes Relative: 6 %
Lymphs Abs: 1.1 K/uL (ref 0.7–4.0)
MCH: 25.4 pg — ABNORMAL LOW (ref 26.0–34.0)
MCHC: 30.6 g/dL (ref 30.0–36.0)
MCV: 83.1 fL (ref 80.0–100.0)
Monocytes Absolute: 1.4 K/uL — ABNORMAL HIGH (ref 0.1–1.0)
Monocytes Relative: 8 %
Neutro Abs: 14.4 K/uL — ABNORMAL HIGH (ref 1.7–7.7)
Neutrophils Relative %: 83 %
Platelets: 496 K/uL — ABNORMAL HIGH (ref 150–400)
RBC: 4.33 MIL/uL (ref 4.22–5.81)
RDW: 18 % — ABNORMAL HIGH (ref 11.5–15.5)
WBC: 17.4 K/uL — ABNORMAL HIGH (ref 4.0–10.5)
nRBC: 0.3 % — ABNORMAL HIGH (ref 0.0–0.2)

## 2024-11-05 LAB — TROPONIN T, HIGH SENSITIVITY
Troponin T High Sensitivity: 27 ng/L — ABNORMAL HIGH (ref 0–19)
Troponin T High Sensitivity: 25 ng/L — ABNORMAL HIGH (ref 0–19)

## 2024-11-05 LAB — PRO BRAIN NATRIURETIC PEPTIDE: Pro Brain Natriuretic Peptide: 981 pg/mL — ABNORMAL HIGH

## 2024-11-05 LAB — LACTIC ACID, PLASMA
Lactic Acid, Venous: 3.2 mmol/L (ref 0.5–1.9)
Lactic Acid, Venous: 1.9 mmol/L (ref 0.5–1.9)

## 2024-11-05 LAB — MAGNESIUM: Magnesium: 2 mg/dL (ref 1.7–2.4)

## 2024-11-05 LAB — URINALYSIS, MICROSCOPIC (REFLEX)
Squamous Epithelial / HPF: NONE SEEN /HPF (ref 0–5)
WBC, UA: NONE SEEN WBC/hpf (ref 0–5)

## 2024-11-05 LAB — CBG MONITORING, ED
Glucose-Capillary: 228 mg/dL — ABNORMAL HIGH (ref 70–99)
Glucose-Capillary: 176 mg/dL — ABNORMAL HIGH (ref 70–99)

## 2024-11-05 MED ORDER — VANCOMYCIN HCL 750 MG/150ML IV SOLN
750.0000 mg | Freq: Two times a day (BID) | INTRAVENOUS | Status: DC
  Filled 2024-11-05: qty 150

## 2024-11-05 MED ORDER — PANTOPRAZOLE SODIUM 40 MG PO TBEC
40.0000 mg | DELAYED_RELEASE_TABLET | Freq: Every day | ORAL | Status: DC
  Administered 2024-11-05 – 2024-11-12 (×8): 40 mg via ORAL
  Filled 2024-11-05 (×8): qty 1

## 2024-11-05 MED ORDER — AMIODARONE HCL IN DEXTROSE 360-4.14 MG/200ML-% IV SOLN
30.0000 mg/h | INTRAVENOUS | Status: AC
  Administered 2024-11-05 – 2024-11-06 (×2): 30 mg/h via INTRAVENOUS
  Filled 2024-11-05: qty 200

## 2024-11-05 MED ORDER — APIXABAN 5 MG PO TABS: 5.0000 mg | ORAL_TABLET | Freq: Two times a day (BID) | ORAL | Status: DC

## 2024-11-05 MED ORDER — AMIODARONE HCL IN DEXTROSE 360-4.14 MG/200ML-% IV SOLN
60.0000 mg/h | INTRAVENOUS | Status: AC
  Administered 2024-11-05 (×2): 60 mg/h via INTRAVENOUS
  Filled 2024-11-05 (×2): qty 200

## 2024-11-05 MED ORDER — INSULIN ASPART 100 UNIT/ML IJ SOLN
0.0000 [IU] | Freq: Every day | INTRAMUSCULAR | Status: DC
  Administered 2024-11-08: 4 [IU] via SUBCUTANEOUS
  Filled 2024-11-05: qty 4

## 2024-11-05 MED ORDER — APIXABAN 5 MG PO TABS: 10.0000 mg | ORAL_TABLET | Freq: Two times a day (BID) | ORAL | Status: DC

## 2024-11-05 MED ORDER — APIXABAN 5 MG PO TABS
10.0000 mg | ORAL_TABLET | Freq: Two times a day (BID) | ORAL | Status: DC
  Administered 2024-11-05 – 2024-11-06 (×3): 10 mg via ORAL
  Filled 2024-11-05 (×3): qty 2

## 2024-11-05 MED ORDER — SODIUM CHLORIDE 0.9 % IV SOLN
2.0000 g | Freq: Three times a day (TID) | INTRAVENOUS | Status: DC
  Administered 2024-11-06 – 2024-11-09 (×11): 2 g via INTRAVENOUS
  Filled 2024-11-05 (×11): qty 12.5

## 2024-11-05 MED ORDER — TAMSULOSIN HCL 0.4 MG PO CAPS
0.4000 mg | ORAL_CAPSULE | Freq: Every day | ORAL | Status: DC
  Administered 2024-11-05 – 2024-11-11 (×7): 0.4 mg via ORAL
  Filled 2024-11-05 (×7): qty 1

## 2024-11-05 MED ORDER — FUROSEMIDE 10 MG/ML IJ SOLN
60.0000 mg | Freq: Once | INTRAMUSCULAR | Status: AC
  Administered 2024-11-05: 60 mg via INTRAVENOUS
  Filled 2024-11-05: qty 6

## 2024-11-05 MED ORDER — VANCOMYCIN HCL 500 MG IV SOLR
500.0000 mg | Freq: Once | INTRAVENOUS | Status: AC
  Administered 2024-11-05: 500 mg via INTRAVENOUS

## 2024-11-05 MED ORDER — ACETAMINOPHEN 325 MG PO TABS
650.0000 mg | ORAL_TABLET | Freq: Four times a day (QID) | ORAL | Status: DC | PRN
  Administered 2024-11-06: 650 mg via ORAL
  Filled 2024-11-05: qty 2

## 2024-11-05 MED ORDER — VANCOMYCIN HCL IN DEXTROSE 1-5 GM/200ML-% IV SOLN
1000.0000 mg | Freq: Once | INTRAVENOUS | Status: AC
  Administered 2024-11-05: 1000 mg via INTRAVENOUS
  Filled 2024-11-05: qty 200

## 2024-11-05 MED ORDER — BUDESON-GLYCOPYRROL-FORMOTEROL 160-9-4.8 MCG/ACT IN AERO
2.0000 | INHALATION_SPRAY | Freq: Two times a day (BID) | RESPIRATORY_TRACT | Status: DC
  Administered 2024-11-06 – 2024-11-12 (×12): 2 via RESPIRATORY_TRACT
  Filled 2024-11-05: qty 5.9

## 2024-11-05 MED ORDER — LEVALBUTEROL HCL 0.63 MG/3ML IN NEBU
0.6300 mg | INHALATION_SOLUTION | RESPIRATORY_TRACT | Status: DC | PRN
  Administered 2024-11-05 – 2024-11-06 (×2): 0.63 mg via RESPIRATORY_TRACT
  Filled 2024-11-05 (×2): qty 3

## 2024-11-05 MED ORDER — VANCOMYCIN HCL IN DEXTROSE 1-5 GM/200ML-% IV SOLN: 1000.0000 mg | Freq: Once | INTRAVENOUS | Status: DC

## 2024-11-05 MED ORDER — INSULIN ASPART 100 UNIT/ML IJ SOLN
0.0000 [IU] | Freq: Three times a day (TID) | INTRAMUSCULAR | Status: DC
  Administered 2024-11-08: 1 [IU] via SUBCUTANEOUS
  Administered 2024-11-08: 2 [IU] via SUBCUTANEOUS
  Administered 2024-11-09: 1 [IU] via SUBCUTANEOUS
  Administered 2024-11-09 – 2024-11-11 (×2): 2 [IU] via SUBCUTANEOUS
  Administered 2024-11-12: 1 [IU] via SUBCUTANEOUS
  Filled 2024-11-05: qty 2
  Filled 2024-11-05: qty 1
  Filled 2024-11-05: qty 2
  Filled 2024-11-05: qty 1
  Filled 2024-11-05: qty 2

## 2024-11-05 MED ORDER — SODIUM CHLORIDE 0.9 % IV SOLN
2.0000 g | Freq: Once | INTRAVENOUS | Status: AC
  Administered 2024-11-05: 2 g via INTRAVENOUS
  Filled 2024-11-05: qty 12.5

## 2024-11-05 MED ORDER — AMIODARONE LOAD VIA INFUSION
150.0000 mg | Freq: Once | INTRAVENOUS | Status: AC
  Administered 2024-11-05: 150 mg via INTRAVENOUS
  Filled 2024-11-05: qty 83.34

## 2024-11-05 NOTE — ED Notes (Signed)
 Patient would like for staff to call his son Camellia Bateman at [phone] with any updates.

## 2024-11-05 NOTE — ED Triage Notes (Signed)
 Pt arrives with c/o SOB and generalized weakness that has been ongoing for a weeks. Pt was recently admitted to [facility] and just discharged yesterday. Pt reports having a PE and not having any of his meds since leaving the hospital. Pt reports CP.

## 2024-11-05 NOTE — Plan of Care (Addendum)
 [facility] to [facility] progressive unit transfer:   84 year old man past medical history of COPD, hyperlipidemia, meningioma, atrial fibrillation on Cardizem  and Eliquis , CAD status post CABG, non-insulin -dependent DM type II, chronic pleural effusion, recent diagnosis of PE, diastolic heart failure and essential hypertension presented emergency department complaining of shortness of breath. Patient has been  discharged yesterday from [facility] after prolonged stay. Sounds like he had influenza A pneumonia A-fib issues diagnosed with a PE. He had previously been on Xarelto  now he is on Eliquis  after PE diagnosis. He was discharged but he is currently dealing with homelessness and has been living in a hotel/motel. Friend went to go see him today and he has been worse short of breath retaining fluid in his legs. Patient was started on Eliquis  Cardizem  furosemide . He does have some of his meds. But currently his social living situation is not very good but she does not feel that much better since leaving the hospital. He did not feel good when he was in the hospital. Denies any chest pain currently but having some discomfort at times. He is had fluid taken off of his lung multiple times. He does have a history of lung cancer. States that fluid that they have drawn off has not ever shown recurrence of cancer   At presentation to ED found to A-fib RVR heart rate up to 151 improved to 109 after Amio bolus and drip.  Otherwise hemodynamically stable.  Afebrile.  On room air.  EKG showed atrial fibrillation heart rate 130.  Lab work, CBC showed leukocytosis stable H&H and slightly better Barlett count.  CMP elevated blood glucose 211 elevated BUN 30.  UA no evidence of UTI.  Elevated troponin 27 which is at baseline.  Elevated proBNP 981. Pending lactic acid level and blood cultures are in process.  Chest x-ray: 1. Interval worsening of airspace opacity in the left upper lobe. 2.  Interval increased at least small volume left pleural effusion.  CT chest without contrast: 1. Airspace disease throughout the left upper lobe concerning for pneumonia. 2. Nodular airspace disease in the lingula and left lower lobe, possibly infectious/inflammatory, although neoplasm cannot be excluded; follow-up chest CT after treatment is recommended to document resolution. 3. Moderate left pleural effusion, increasing since prior CT. 4. Small scattered clustered ground-glass nodules in the right upper lobe. Recommended attention on follow-up imaging. 5. Soft tissue extends from the left hilum throughout the left upper lobe to the lateral left pleural surface and is stable since the prior study, favor scarring. 6. Emphysema.  In the ED patient patient has been given Amio bolus followed by amiodarone  drip.  Also received cefepime and vancomycin  and 60 mg of Lasix .  In the ED code sepsis has been activated.   Hospitalist consulted for further evaluation management for A-fib RVR, generalized anasarca and healthcare associated pneumonia.   [facility] will assume care on arrival to accepting facility. Until arrival, care as per EDP. However, [facility] available 24/7 for questions and assistance. Check [email or web address] for on-call coverage. Nursing staff, please call [facility] Admits & Consults System-Wide number under Amion on patient's arrival so appropriate admitting provider can evaluate the pt.   Author: Lorelle Macaluso, MD  [facility]

## 2024-11-05 NOTE — ED Provider Notes (Addendum)
 " [place] AT [facility] Provider Note   CSN: [id number] Arrival date & time: 11/05/24  1637     Patient presents with: Shortness of Breath   Robert Bates is a 84 y.o. male.   Patient here with shortness of breath.  Just discharged yesterday from [facility] after prolonged stay.  Sounds like he had influenza A pneumonia A-fib issues diagnosed with a PE.  He had previously been on Xarelto  now he is on Eliquis  after PE diagnosis.  He was discharged but he is currently dealing with homelessness and has been living in a hotel/motel.  Friend went to go see him today and he has been worse short of breath retaining fluid in his legs.  Patient was started on Eliquis  Cardizem  furosemide .  He does have some of his meds.  But currently his social living situation is not very good but she does not feel that much better since leaving the hospital.  He did not feel good when he was in the hospital.  Denies any chest pain currently but having some discomfort at times.  He is had fluid taken off of his lung multiple times.  He does have a history of lung cancer.  States that fluid that they have drawn off has not ever shown recurrence of cancer.  He has history of reflux high cholesterol meningioma COPD A-fib.  He is currently taking amiodarone  Cardizem  furosemide  Eliquis  for this.  He is no longer antibiotics.  He denies any fever.  The history is provided by the patient.       Prior to Admission medications  Medication Sig Start Date End Date Taking? Authorizing Provider  acetaminophen  (TYLENOL ) 500 MG tablet Take 1-2 tablets (500-1,000 mg total) by mouth every 6 (six) hours as needed. 05/16/21   Zimmerman, Donielle M, PA-C  amiodarone  (PACERONE ) 200 MG tablet Take 1 tablet (200 mg total) by mouth daily. 10/15/23   Nahser, Aleene PARAS, MD  atorvastatin  (LIPITOR ) 80 MG tablet Take 1 tablet (80 mg total) by mouth daily. 04/30/24   O'Sullivan, Melissa, NP   budesonide -glycopyrrolate -formoterol  (BREZTRI  AEROSPHERE) 160-9-4.8 MCG/ACT AERO inhaler Inhale 2 puffs into the lungs 2 (two) times daily. 04/14/24   O'Sullivan, Melissa, NP  Cyanocobalamin (VITAMIN B-12 CR PO) Take 1 tablet by mouth daily.    [provider]  dapagliflozin  propanediol (FARXIGA ) 5 MG TABS tablet Take 1 tablet (5 mg total) by mouth daily. 10/13/24   O'Sullivan, Melissa, NP  diclofenac  Sodium (VOLTAREN ) 1 % GEL Apply 4 g topically 4 (four) times daily. 08/13/24   Emil Share, DO  diltiazem  (CARDIZEM  CD) 120 MG 24 hr capsule Take 1 capsule (120 mg total) by mouth daily. 01/28/24   O'Sullivan, Melissa, NP  furosemide  (LASIX ) 40 MG tablet Take 1 tablet (40 mg total) by mouth daily. 06/22/24   O'Sullivan, Melissa, NP  gabapentin  (NEURONTIN ) 100 MG capsule Take 1 capsule (100 mg total) by mouth 3 (three) times daily. 07/21/24   O'Sullivan, Melissa, NP  levalbuterol  (XOPENEX ) 0.63 MG/3ML nebulizer solution Take 3 mLs (0.63 mg total) by nebulization every 4 (four) hours as needed for wheezing or shortness of breath. 04/14/24   O'Sullivan, Melissa, NP  lisinopril  (ZESTRIL ) 2.5 MG tablet Take 1 tablet (2.5 mg total) by mouth daily. 06/26/24   O'Sullivan, Melissa, NP  omeprazole  (PRILOSEC) 40 MG capsule Take 1 capsule (40 mg total) by mouth daily. 01/28/24   O'Sullivan, Melissa, NP  ondansetron  (ZOFRAN ) 4 MG tablet Take 1 tablet (4  mg total) by mouth every 8 (eight) hours as needed for nausea or vomiting. 09/27/24   Daryl Setter, NP  potassium chloride  (KLOR-CON ) 10 MEQ tablet Take 10 mEq by mouth daily.    [provider]  sertraline  (ZOLOFT ) 50 MG tablet Take 1 tablet (50 mg total) by mouth daily. Patient not taking: Reported on 10/13/2024 05/21/24   O'Sullivan, Melissa, NP  spironolactone  (ALDACTONE ) 25 MG tablet Take 1 tablet (25 mg total) by mouth daily. 06/26/24   O'Sullivan, Melissa, NP  tamsulosin  (FLOMAX ) 0.4 MG CAPS capsule Take 1 capsule (0.4 mg total) by mouth at  bedtime. 06/11/24   O'Sullivan, Melissa, NP  XARELTO  20 MG TABS tablet TAKE 1 TABLET BY MOUTH EVERY DAY 10/10/24   Daryl Setter, NP    Allergies: Iodine, Iohexol , Metformin  and related, and Metformin     Review of Systems  Respiratory:  Positive for shortness of breath.     Updated Vital Signs BP 121/78   Pulse (!) 109   Temp 98.2 F (36.8 C) (Oral)   Resp (!) 27   Wt 77.1 kg   SpO2 95%   BMI 25.10 kg/m   Physical Exam Vitals and nursing note reviewed.  Constitutional:      Appearance: He is well-developed. He is ill-appearing.  HENT:     Head: Normocephalic and atraumatic.  Eyes:     Conjunctiva/sclera: Conjunctivae normal.     Pupils: Pupils are equal, round, and reactive to light.  Cardiovascular:     Rate and Rhythm: Tachycardia present. Rhythm irregular.     Heart sounds: No murmur heard. Pulmonary:     Effort: Pulmonary effort is normal. No respiratory distress.     Breath sounds: Decreased breath sounds and rhonchi present.  Abdominal:     Palpations: Abdomen is soft.     Tenderness: There is no abdominal tenderness.  Musculoskeletal:        General: No swelling.     Cervical back: Neck supple.     Right lower leg: Edema present.     Left lower leg: Edema present.  Skin:    General: Skin is warm and dry.     Capillary Refill: Capillary refill takes less than 2 seconds.  Neurological:     General: No focal deficit present.     Mental Status: He is alert.  Psychiatric:        Mood and Affect: Mood normal.     (all labs ordered are listed, but only abnormal results are displayed) Labs Reviewed  COMPREHENSIVE METABOLIC PANEL WITH GFR - Abnormal; Notable for the following components:      Result Value   Glucose, Bld 211 (*)    BUN 30 (*)    Total Protein 5.8 (*)    Albumin  3.3 (*)    All other components within normal limits  CBC WITH DIFFERENTIAL/PLATELET - Abnormal; Notable for the following components:   WBC 17.4 (*)    Hemoglobin 11.0 (*)     HCT 36.0 (*)    MCH 25.4 (*)    RDW 18.0 (*)    Platelets 496 (*)    nRBC 0.3 (*)    Neutro Abs 14.4 (*)    Monocytes Absolute 1.4 (*)    Abs Immature Granulocytes 0.43 (*)    All other components within normal limits  PRO BRAIN NATRIURETIC PEPTIDE - Abnormal; Notable for the following components:   Pro Brain Natriuretic Peptide 981.0 (*)    All other components within normal limits  CBG  MONITORING, ED - Abnormal; Notable for the following components:   Glucose-Capillary 228 (*)    All other components within normal limits  TROPONIN T, HIGH SENSITIVITY - Abnormal; Notable for the following components:   Troponin T High Sensitivity 27 (*)    All other components within normal limits  CULTURE, BLOOD (ROUTINE X 2)  CULTURE, BLOOD (ROUTINE X 2)  MAGNESIUM   URINALYSIS, ROUTINE W REFLEX MICROSCOPIC  LACTIC ACID, PLASMA  LACTIC ACID, PLASMA  TROPONIN T, HIGH SENSITIVITY    EKG: EKG Interpretation Date/Time:  Friday November 05 2024 17:01:06 EST Ventricular Rate:  130 PR Interval:    QRS Duration:  107 QT Interval:  323 QTC Calculation: 475 R Axis:   -40  Text Interpretation: Atrial fibrillation Left anterior fascicular block Low voltage, precordial leads Consider anterior infarct Abnormal T, consider ischemia, lateral leads Confirmed by Ruthe Cornet [phone]) on 11/05/2024 5:16:05 PM  Radiology: CT Chest Wo Contrast Result Date: 11/05/2024 EXAM: CT CHEST WITHOUT CONTRAST 11/05/2024 06:44:00 PM TECHNIQUE: CT of the chest was performed without the administration of intravenous contrast. Multiplanar reformatted images are provided for review. Automated exposure control, iterative reconstruction, and/or weight based adjustment of the mA/kV was utilized to reduce the radiation dose to as low as reasonably achievable. COMPARISON: Chest x-ray today. Chest CT 03/2024. CLINICAL HISTORY: Cough, chronic/persisting > 8 weeks, failed empiric treatment. FINDINGS: MEDIASTINUM: Heart and pericardium  are unremarkable. The central airways are clear. Diffuse aortic atherosclerosis. Large hiatal hernia. LYMPH NODES: No mediastinal, hilar or axillary lymphadenopathy. LUNGS AND PLEURA: Mild emphysema. Moderate left pleural effusion, increasing since prior CT. Soft tissue contiguous with the left hilum and extending throughout the left lung to the left lateral pleural surface. This is unchanged since the prior study and may reflect scarring. Airspace disease throughout the left upper lobe concerning for pneumonia. Nodular airspace disease in the lingula and left lower lobe which could be infectious/inflammatory, although neoplasm cannot be excluded. Scattered clustered nodular densities in the right upper lobe. No pneumothorax. SOFT TISSUES/BONES: No acute abnormality of the bones or soft tissues. UPPER ABDOMEN: Limited images of the upper abdomen demonstrates no acute abnormality. IMPRESSION: 1. Airspace disease throughout the left upper lobe concerning for pneumonia. 2. Nodular airspace disease in the lingula and left lower lobe, possibly infectious/inflammatory, although neoplasm cannot be excluded; follow-up chest CT after treatment is recommended to document resolution. 3. Moderate left pleural effusion, increasing since prior CT. 4. Small scattered clustered ground-glass nodules in the right upper lobe. Recommended attention on follow-up imaging. 5. Soft tissue extends from the left hilum throughout the left upper lobe to the lateral left pleural surface and is stable since the prior study, favor scarring. 6. Emphysema. Electronically signed by: Franky Crease MD 11/05/2024 07:28 PM EST RP Workstation: [id number]   DG Chest Portable 1 View Result Date: 11/05/2024 EXAM: 1 VIEW(S) XRAY OF THE CHEST 11/05/2024 05:46:00 PM COMPARISON: 07/30/2024 CLINICAL HISTORY: CP FINDINGS: LUNGS AND PLEURA: Interval worsening of airspace opacity in the left upper lobe. Interval increased at least small volume left pleural  effusion. No pneumothorax. HEART AND MEDIASTINUM: Aortic calcification. Left atrial clip noted. BONES AND SOFT TISSUES: Sternotomy and cardiac surgical changes noted. IMPRESSION: 1. Interval worsening of airspace opacity in the left upper lobe. 2. Interval increased at least small volume left pleural effusion. Electronically signed by: Morgane Naveau MD 11/05/2024 06:40 PM EST RP Workstation: [id number]     .Critical Care  Performed by: Ruthe Cornet, DO Authorized by: Ruthe Cornet, DO   Critical  care provider statement:    Critical care time (minutes):  35   Critical care was necessary to treat or prevent imminent or life-threatening deterioration of the following conditions:  Cardiac failure and sepsis   Critical care was time spent personally by me on the following activities:  Development of treatment plan with patient or surrogate, blood draw for specimens, discussions with primary provider, evaluation of patient's response to treatment, examination of patient, obtaining history from patient or surrogate, ordering and performing treatments and interventions, ordering and review of laboratory studies, ordering and review of radiographic studies, pulse oximetry, review of old charts and re-evaluation of patient's condition   Care discussed with: admitting provider      Medications Ordered in the ED  amiodarone  (NEXTERONE ) 1.8 mg/mL load via infusion 150 mg (150 mg Intravenous Bolus from Bag 11/05/24 1757)    Followed by  amiodarone  (NEXTERONE  PREMIX) 360-4.14 MG/200ML-% (1.8 mg/mL) IV infusion (60 mg/hr Intravenous New Bag/Given 11/05/24 1758)    Followed by  amiodarone  (NEXTERONE  PREMIX) 360-4.14 MG/200ML-% (1.8 mg/mL) IV infusion (has no administration in time range)  vancomycin  (VANCOCIN ) IVPB 1000 mg/200 mL premix (has no administration in time range)  ceFEPIme (MAXIPIME) 2 g in sodium chloride  0.9 % 100 mL IVPB (has no administration in time range)  furosemide  (LASIX ) injection 60 mg  (60 mg Intravenous Given 11/05/24 1809)                                    Medical Decision Making Amount and/or Complexity of Data Reviewed Labs: ordered. Radiology: ordered.  Risk Prescription drug management. Decision regarding hospitalization.   Gerlad Pelzel is here with shortness of breath failure to thrive.  Found to be in A-fib with RVR on EKG upon arrival here.  Just had prolonged hospital stay at [facility].  Per my review of this he was diagnosed with positive nuclear scan concern for PE he had already been on Xarelto  for A-fib and was switched over to Eliquis .  He had Cardizem  added to his amiodarone .  He has been on Lasix .  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  He had thoracentesis there that was unremarkable.  No culture growth.  He was positive for influenza A.  Then he was then treated with antibiotics as well.  He got discharged to a motel where he is living.  He still with homelessness.  Is not really able to take care of himself.  Sounds like he has been taking his meds but I think he is quite overwhelmed.  Clinically he looks volume overloaded.  He is in A-fib with RVR with significant anasarca on exam.  But no fever.  Will start him on IV amiodarone  get labs including troponin BNP CT chest to further evaluate.  proBNP is about of thousand troponins 27.  Has had a leukocytosis of 17.  Patient does have contrast allergy really cannot evaluate him for PE but he was on anticoagulation while in the hospital.  Is only been home for a day.  Does not like his really missed any doses of meds.  Ultimately awaiting remaining labs and CT imaging.  Overall lab work again for white count of 17, mild troponin elevation of 27, proBNP of 980.  CT chest shows ongoing left upper lobe pneumonia with nodular airspace disease in the lower lobe.  Ongoing pleural effusion that slightly increased as  well.  Ultimately will finish sepsis workup given white  count source for possible infection.  A-fib with RVR.  Patient is on amiodarone  heart rates stable in the low 100s.  Will start broad-spectrum IV antibiotics get a lactic acid and get blood cultures and will admit the patient for further care.  Overall lactic was elevated at 3.2 but I think he is grossly volume overloaded and not given him IV fluids to be very detrimental to his overall care.  He does not have a fever.  Lactic is likely from demand from his A-fib with RVR.  Overall I do not think it would be smart to give him IV fluids at this time.  Clinically I think he is overloaded.  Hemodynamics are improving with IV amiodarone .  To be admitted to medicine for further care.  This chart was dictated using voice recognition software.  Despite best efforts to proofread,  errors can occur which can change the documentation meaning.      Final diagnoses:  Atrial fibrillation with controlled ventricular rate (HCC)  HAP (hospital-acquired pneumonia)  Pleural effusion    ED Discharge Orders     None          Ruthe Cornet, DO 11/05/24 1938    Ruthe Cornet, DO 11/05/24 2313  "

## 2024-11-05 NOTE — ED Notes (Signed)
 Pt has been homeless and living in a motel. Friend reports [facility] discharged pt with no where to go. States he is scheduled to go to medical shelter on Sunday. Pt did not start eliquis , cardizem  or furosemide  that pt picked up yesterday. Friend says he has a son that is out of town

## 2024-11-05 NOTE — Telephone Encounter (Signed)
 Error, patient on the way to ED>

## 2024-11-05 NOTE — Sepsis Progress Note (Addendum)
 Elink monitoring for the code sepsis protocol.   2255: Notified bedside nurse of need to draw 2nd lactic acid.

## 2024-11-05 NOTE — Telephone Encounter (Signed)
 FYI Only or Action Required?: FYI only for provider: ED advised.  Patient was last seen in primary care on 08/20/2024 by Daryl Setter, NP.  Called Nurse Triage reporting Shortness of Breath.  Symptoms began several weeks ago.  Interventions attempted: Other: recent hospitalization.  Symptoms are: unchanged.  Triage Disposition: Go to ED Now (or PCP Triage)  Patient/caregiver understands and will follow disposition?: Unsure  Copied from CRM [id number]. Topic: Clinical - Red Word Triage >> Nov 05, 2024  9:13 AM Robinson DEL wrote: Red Word that prompted transfer to Nurse Triage: Released from hospital yesterday, told he had fluid on lungs and was drained, they also found a blood clot on lung, breathing still bad and having issues breathing, can't hardly walk keeps falling, no strength in legs. Reason for Disposition  Patient sounds very sick or weak to the triager  Answer Assessment - Initial Assessment Questions Recently discharged from hospital. Patient feels like he was discharged too soon and states that he is still short of breath.    1. RESPIRATORY STATUS: Describe your breathing? (e.g., wheezing, shortness of breath, unable to speak, severe coughing)      Short of breath 2. ONSET: When did this breathing problem begin?      Has not resolved since hospitalization 3. PATTERN Does the difficult breathing come and go, or has it been constant since it started?      constant 4. SEVERITY: How bad is your breathing? (e.g., mild, moderate, severe)      Reports that it hurts when he breathes 5. RECURRENT SYMPTOM: Have you had difficulty breathing before? If Yes, ask: When was the last time? and What happened that time?      Yes, history of lungs needing to be drained 6. CARDIAC HISTORY: Do you have any history of heart disease? (e.g., heart attack, angina, bypass surgery, angioplasty)      Afib, HTN 7. LUNG HISTORY: Do you have any history of lung disease?  (e.g.,  pulmonary embolus, asthma, emphysema)     COPD 8. CAUSE: What do you think is causing the breathing problem?      Unsure, does not feel like he was ready to be discharged after recent hospitalization 9. OTHER SYMPTOMS: Do you have any other symptoms? (e.g., chest pain, cough, dizziness, fever, runny nose)     Reports weakness 10. O2 SATURATION MONITOR:  Do you use an oxygen  saturation monitor (pulse oximeter) at home? If Yes, ask: What is your reading (oxygen  level) today? What is your usual oxygen  saturation reading? (e.g., 95%)       98%, HR varies from 60's to low 100's  Protocols used: Breathing Difficulty-A-AH

## 2024-11-05 NOTE — Progress Notes (Signed)
 Pharmacy Antibiotic Note  Robert Bates is a 84 y.o. male admitted on 11/05/2024 with generalized weakness, was admitted to [facility] regional and discharged yesterday for a PE.  Pharmacy has been consulted for vancomycin  and cefepime dosing.  Plan: Cefepime 2g q8h.  Vancomycin  1500mg  loading dose ordered, then start vancomycin  750mg  q12h for eAUC: 457 using Scr: 0.85.  Follow culture data for de-escalation.  Monitor renal function for dose adjustments as indicated.   Weight: 77.1 kg (170 lb)  Temp (24hrs), Avg:98.2 F (36.8 C), Min:98.2 F (36.8 C), Max:98.2 F (36.8 C)  Recent Labs  Lab 11/05/24 1702  WBC 17.4*  CREATININE 0.85    Estimated Creatinine Clearance: 65.8 mL/min (by C-G formula based on SCr of 0.85 mg/dL).    Allergies[1]  Antimicrobials this admission: Cefepime 1/2 >>  Vancomycin  1/2 >>   Microbiology results: 1/2 BCx:  1/2 Sputum:     PHARMACY - ANTICOAGULATION CONSULT NOTE  Pharmacy Consult for Eliquis   Indication: pulmonary embolus  Allergies[2]  Patient Measurements: Weight: 77.1 kg (170 lb)  Vital Signs: Temp: 98.2 F (36.8 C) (01/02 1657) Temp Source: Oral (01/02 1657) BP: 115/80 (01/02 2015) Pulse Rate: 111 (01/02 2015)  Labs: Recent Labs    11/05/24 1702  HGB 11.0*  HCT 36.0*  PLT 496*  CREATININE 0.85    Estimated Creatinine Clearance: 65.8 mL/min (by C-G formula based on SCr of 0.85 mg/dL).   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 Rockey [place].    Peripheral vascular disease    Abdominal Aortic Aneursym   Pneumonia    as a child   Radiation 09/18/15-10/25/15   left lower lobe 70.2 Gy   Seizures (HCC) 02/18/2020   Tobacco abuse     Assessment: Patient recently discharged from [facility] and has been unable to get med since discharge. Called facility last dose of Eliquis  was 1/1 AM. Patient was on Xarelto  and was swapped to Eliquis  on 12/29 due to new PE. Received 10mg  x1 on 12/29, 10mg  BIX on 12/30 and 12/31. 10mg  x1 on 1/1 in the AM prior to discharge.   Plan:  Patient has received 3 full days of loading dose Eliquis  for PE. Will start Eliqius 10mg  BID x 4 days followed by Eliquis  5mg  BID.  F/u s/sx of bleeding.  F/u TOC needs.   Powell Blush, PharmD, BCCCP 11/05/2024,8:21 PM       [1]  Allergies Allergen Reactions   Iodine Swelling    Neck, gland swelling  Iohexol  Swelling    Neck, gland swelling   Metformin  And Related Diarrhea   Metformin  Nausea Only  [2]  Allergies Allergen Reactions   Iodine Swelling    Neck, gland swelling   Iohexol  Swelling    Neck, gland swelling   Metformin  And Related Diarrhea   Metformin  Nausea Only

## 2024-11-05 NOTE — Progress Notes (Signed)
 Case Management Discharge Note        CSN: [id number] DOB: April 10, 1941 Service: General Medicine Location: 775/01  Patient Class: Inpatient  DC Disposition: : Home Health Care  Discharge DC Disposition: : Home Health Care Homecare Referral: (PT) Physical Therapy, (SN) Skilled Nursing Homecare/Hospice Agency(s) chosen: [facility] DME: Other (comment) (rollator) Patient DME Agency: [facility]  Discharge Referrals Patient Preference: Chosen geographical local area/county shared with patient/family: Yes Date chosen geographical local area/county list shared with patient/family: 10/21/24 Patient Preference for Post-Acute Provider Form completed: Yes Case closed, patient/family agree with disposition plan: Yes     Durable Medical Equipment Coordination Status: Coordination complete.    Case Management Coordination Status: Coordination Complete     Robert Bates, MSW

## 2024-11-05 NOTE — Telephone Encounter (Signed)
 Patient staying at a motel at this time and waiting for a ride to go to  ed.  He will  try to go to [facility] ED

## 2024-11-06 ENCOUNTER — Encounter (HOSPITAL_COMMUNITY): Payer: Self-pay | Admitting: Internal Medicine

## 2024-11-06 DIAGNOSIS — I480 Paroxysmal atrial fibrillation: Secondary | ICD-10-CM

## 2024-11-06 DIAGNOSIS — I110 Hypertensive heart disease with heart failure: Secondary | ICD-10-CM | POA: Diagnosis present

## 2024-11-06 DIAGNOSIS — Z8616 Personal history of COVID-19: Secondary | ICD-10-CM | POA: Diagnosis not present

## 2024-11-06 DIAGNOSIS — E7800 Pure hypercholesterolemia, unspecified: Secondary | ICD-10-CM | POA: Diagnosis present

## 2024-11-06 DIAGNOSIS — J449 Chronic obstructive pulmonary disease, unspecified: Secondary | ICD-10-CM

## 2024-11-06 DIAGNOSIS — I5032 Chronic diastolic (congestive) heart failure: Secondary | ICD-10-CM | POA: Diagnosis not present

## 2024-11-06 DIAGNOSIS — Z7901 Long term (current) use of anticoagulants: Secondary | ICD-10-CM | POA: Diagnosis not present

## 2024-11-06 DIAGNOSIS — E1142 Type 2 diabetes mellitus with diabetic polyneuropathy: Secondary | ICD-10-CM

## 2024-11-06 DIAGNOSIS — E119 Type 2 diabetes mellitus without complications: Secondary | ICD-10-CM | POA: Diagnosis not present

## 2024-11-06 DIAGNOSIS — Z79899 Other long term (current) drug therapy: Secondary | ICD-10-CM | POA: Diagnosis not present

## 2024-11-06 DIAGNOSIS — M549 Dorsalgia, unspecified: Secondary | ICD-10-CM

## 2024-11-06 DIAGNOSIS — D329 Benign neoplasm of meninges, unspecified: Secondary | ICD-10-CM | POA: Diagnosis not present

## 2024-11-06 DIAGNOSIS — Y95 Nosocomial condition: Secondary | ICD-10-CM | POA: Diagnosis present

## 2024-11-06 DIAGNOSIS — K219 Gastro-esophageal reflux disease without esophagitis: Secondary | ICD-10-CM

## 2024-11-06 DIAGNOSIS — J9601 Acute respiratory failure with hypoxia: Secondary | ICD-10-CM

## 2024-11-06 DIAGNOSIS — G8929 Other chronic pain: Secondary | ICD-10-CM | POA: Diagnosis present

## 2024-11-06 DIAGNOSIS — I2699 Other pulmonary embolism without acute cor pulmonale: Secondary | ICD-10-CM

## 2024-11-06 DIAGNOSIS — Z66 Do not resuscitate: Secondary | ICD-10-CM | POA: Diagnosis present

## 2024-11-06 DIAGNOSIS — Z951 Presence of aortocoronary bypass graft: Secondary | ICD-10-CM

## 2024-11-06 DIAGNOSIS — E1151 Type 2 diabetes mellitus with diabetic peripheral angiopathy without gangrene: Secondary | ICD-10-CM | POA: Diagnosis present

## 2024-11-06 DIAGNOSIS — R351 Nocturia: Secondary | ICD-10-CM

## 2024-11-06 DIAGNOSIS — G40909 Epilepsy, unspecified, not intractable, without status epilepticus: Secondary | ICD-10-CM

## 2024-11-06 DIAGNOSIS — J90 Pleural effusion, not elsewhere classified: Secondary | ICD-10-CM | POA: Diagnosis present

## 2024-11-06 DIAGNOSIS — D320 Benign neoplasm of cerebral meninges: Secondary | ICD-10-CM | POA: Diagnosis present

## 2024-11-06 DIAGNOSIS — J440 Chronic obstructive pulmonary disease with acute lower respiratory infection: Secondary | ICD-10-CM | POA: Diagnosis present

## 2024-11-06 DIAGNOSIS — N401 Enlarged prostate with lower urinary tract symptoms: Secondary | ICD-10-CM | POA: Diagnosis not present

## 2024-11-06 DIAGNOSIS — I10 Essential (primary) hypertension: Secondary | ICD-10-CM

## 2024-11-06 DIAGNOSIS — R4781 Slurred speech: Secondary | ICD-10-CM | POA: Diagnosis not present

## 2024-11-06 DIAGNOSIS — C3432 Malignant neoplasm of lower lobe, left bronchus or lung: Secondary | ICD-10-CM | POA: Diagnosis present

## 2024-11-06 DIAGNOSIS — I5033 Acute on chronic diastolic (congestive) heart failure: Secondary | ICD-10-CM | POA: Diagnosis present

## 2024-11-06 DIAGNOSIS — Z5901 Sheltered homelessness: Secondary | ICD-10-CM | POA: Diagnosis not present

## 2024-11-06 DIAGNOSIS — I714 Abdominal aortic aneurysm, without rupture, unspecified: Secondary | ICD-10-CM | POA: Diagnosis present

## 2024-11-06 DIAGNOSIS — E1165 Type 2 diabetes mellitus with hyperglycemia: Secondary | ICD-10-CM | POA: Diagnosis present

## 2024-11-06 DIAGNOSIS — F419 Anxiety disorder, unspecified: Secondary | ICD-10-CM | POA: Diagnosis not present

## 2024-11-06 DIAGNOSIS — I2510 Atherosclerotic heart disease of native coronary artery without angina pectoris: Secondary | ICD-10-CM | POA: Diagnosis present

## 2024-11-06 DIAGNOSIS — R627 Adult failure to thrive: Secondary | ICD-10-CM | POA: Diagnosis present

## 2024-11-06 DIAGNOSIS — E871 Hypo-osmolality and hyponatremia: Secondary | ICD-10-CM | POA: Diagnosis not present

## 2024-11-06 DIAGNOSIS — J439 Emphysema, unspecified: Secondary | ICD-10-CM | POA: Diagnosis present

## 2024-11-06 LAB — COMPREHENSIVE METABOLIC PANEL WITH GFR
ALT: 20 U/L (ref 0–44)
AST: 16 U/L (ref 15–41)
Albumin: 2.9 g/dL — ABNORMAL LOW (ref 3.5–5.0)
Alkaline Phosphatase: 86 U/L (ref 38–126)
Anion gap: 9 (ref 5–15)
BUN: 25 mg/dL — ABNORMAL HIGH (ref 8–23)
CO2: 29 mmol/L (ref 22–32)
Calcium: 8.3 mg/dL — ABNORMAL LOW (ref 8.9–10.3)
Chloride: 99 mmol/L (ref 98–111)
Creatinine, Ser: 0.63 mg/dL (ref 0.61–1.24)
GFR, Estimated: >60 mL/min
Glucose, Bld: 134 mg/dL — ABNORMAL HIGH (ref 70–99)
Potassium: 3.9 mmol/L (ref 3.5–5.1)
Sodium: 136 mmol/L (ref 135–145)
Total Bilirubin: 0.4 mg/dL (ref 0.0–1.2)
Total Protein: 5.4 g/dL — ABNORMAL LOW (ref 6.5–8.1)

## 2024-11-06 LAB — CBC
HCT: 32.9 % — ABNORMAL LOW (ref 39.0–52.0)
Hemoglobin: 10.1 g/dL — ABNORMAL LOW (ref 13.0–17.0)
MCH: 25.9 pg — ABNORMAL LOW (ref 26.0–34.0)
MCHC: 30.7 g/dL (ref 30.0–36.0)
MCV: 84.4 fL (ref 80.0–100.0)
Platelets: 386 K/uL (ref 150–400)
RBC: 3.9 MIL/uL — ABNORMAL LOW (ref 4.22–5.81)
RDW: 18 % — ABNORMAL HIGH (ref 11.5–15.5)
WBC: 12.3 K/uL — ABNORMAL HIGH (ref 4.0–10.5)
nRBC: 0 % (ref 0.0–0.2)

## 2024-11-06 LAB — GLUCOSE, CAPILLARY
Glucose-Capillary: 130 mg/dL — ABNORMAL HIGH (ref 70–99)
Glucose-Capillary: 153 mg/dL — ABNORMAL HIGH (ref 70–99)

## 2024-11-06 LAB — PROCALCITONIN: Procalcitonin: <0.1 ng/mL

## 2024-11-06 LAB — CBG MONITORING, ED
Glucose-Capillary: 108 mg/dL — ABNORMAL HIGH (ref 70–99)
Glucose-Capillary: 114 mg/dL — ABNORMAL HIGH (ref 70–99)

## 2024-11-06 MED ORDER — ATORVASTATIN CALCIUM 80 MG PO TABS
80.0000 mg | ORAL_TABLET | Freq: Every day | ORAL | Status: DC
  Administered 2024-11-07 – 2024-11-12 (×6): 80 mg via ORAL
  Filled 2024-11-06 (×6): qty 1

## 2024-11-06 MED ORDER — DAPAGLIFLOZIN PROPANEDIOL 5 MG PO TABS
5.0000 mg | ORAL_TABLET | Freq: Every day | ORAL | Status: DC
  Administered 2024-11-07 – 2024-11-12 (×6): 5 mg via ORAL
  Filled 2024-11-06 (×7): qty 1

## 2024-11-06 MED ORDER — VANCOMYCIN HCL 1000 MG IV SOLR
750.0000 mg | Freq: Two times a day (BID) | INTRAVENOUS | Status: DC
  Administered 2024-11-06: 750 mg via INTRAVENOUS
  Filled 2024-11-06 (×3): qty 15

## 2024-11-06 MED ORDER — AMIODARONE HCL 200 MG PO TABS: 200.0000 mg | ORAL_TABLET | Freq: Every day | ORAL | Status: DC

## 2024-11-06 MED ORDER — POLYETHYLENE GLYCOL 3350 17 G PO PACK
17.0000 g | PACK | Freq: Every day | ORAL | Status: DC | PRN
  Administered 2024-11-08: 17 g via ORAL
  Filled 2024-11-06: qty 1

## 2024-11-06 MED ORDER — FUROSEMIDE 10 MG/ML IJ SOLN
60.0000 mg | Freq: Two times a day (BID) | INTRAMUSCULAR | Status: DC
  Administered 2024-11-06 – 2024-11-10 (×8): 60 mg via INTRAVENOUS
  Filled 2024-11-06 (×8): qty 6

## 2024-11-06 MED ORDER — ACETAMINOPHEN 650 MG RE SUPP: 650.0000 mg | Freq: Four times a day (QID) | RECTAL | Status: DC | PRN

## 2024-11-06 MED ORDER — DILTIAZEM HCL ER COATED BEADS 240 MG PO CP24
240.0000 mg | ORAL_CAPSULE | Freq: Every day | ORAL | Status: DC
  Administered 2024-11-06 – 2024-11-12 (×7): 240 mg via ORAL
  Filled 2024-11-06 (×7): qty 1

## 2024-11-06 MED ORDER — DILTIAZEM HCL ER COATED BEADS 240 MG PO CP24: 240.0000 mg | ORAL_CAPSULE | Freq: Every day | ORAL | Status: DC

## 2024-11-06 MED ORDER — VANCOMYCIN HCL 750 MG/150ML IV SOLN
750.0000 mg | Freq: Two times a day (BID) | INTRAVENOUS | Status: DC
  Administered 2024-11-06 – 2024-11-07 (×2): 750 mg via INTRAVENOUS
  Filled 2024-11-06 (×2): qty 150

## 2024-11-06 MED ORDER — DILTIAZEM HCL ER COATED BEADS 120 MG PO CP24: 120.0000 mg | ORAL_CAPSULE | Freq: Every day | ORAL | Status: DC

## 2024-11-06 MED ORDER — ACETAMINOPHEN 325 MG PO TABS
650.0000 mg | ORAL_TABLET | Freq: Four times a day (QID) | ORAL | Status: DC | PRN
  Administered 2024-11-06 – 2024-11-09 (×2): 650 mg via ORAL
  Filled 2024-11-06 (×3): qty 2

## 2024-11-06 MED ORDER — SODIUM CHLORIDE 0.9% FLUSH
3.0000 mL | Freq: Two times a day (BID) | INTRAVENOUS | Status: DC
  Administered 2024-11-06 – 2024-11-12 (×12): 3 mL via INTRAVENOUS

## 2024-11-06 NOTE — ED Notes (Signed)
 Patient stated he had a HA 5/10, gave prn tylenol 

## 2024-11-06 NOTE — H&P (Signed)
 " History and Physical   Robert Bates FMW:[id number] DOB: 05/02/1941 DOA: 11/05/2024  PCP: Daryl Setter, NP   Patient coming from: Home/motel/unhoused  Chief Complaint: Shortness of breath  HPI: Robert Bates is a 84 y.o. male with medical history significant of hypertension, hyperlipidemia, GERD, atrial fibrillation, CAD status post CABG, diabetes, neuropathy, chronic diastolic CHF, seizure disorder, anxiety, AAA, lung cancer status post radiation, recurrent pleural effusion, COPD, BPH, chronic pain.  Recent admission for respiratory failure, COPD, CHF, PE, RVR.  Presenting with worsening shortness of breath.  Patient admitted 12/15-11/04/2024 at [facility].  Presented with weakness, chills, hypoxia.  Diagnosed with flu A,, COPD exacerbation.  Admission was complicated by CHF exacerbation and A-fib with RVR.  Further complicated by PE based on VQ scan results.  Also had recurrence of his left pleural effusion which required thoracentesis on 12/26 and yielded 1.1 L.  Cytology was without malignant cells.  Patient returned home today prior to presenting to freestanding ED.  Reports that currently is homeless and living in a hotel/motel.  Had continued/worsening shortness of breath ever since discharge and also persistent edema.  Presented to the ED for further evaluation.  Denies fevers, chills, abdominal pain, constipation, diarrhea, nausea, vomiting.  ED Course: Vital signs in the ED notable for heart rate in the 70s-140s, blood pressure in the 90s-120 systolic, respirate in the teens-20s.  Requiring 2 to 3 L to maintain saturations.  Lab workup included CMP with BUN 30, glucose 211, protein 5.8, albumin  3.3.  CBC with hemoglobin stable 11, platelets 496, leukocytosis to 17 (was 15, 11.6 the 2 days previous at [facility]).  Troponin flat at 27, 25.  proBNP indeterminate at 981.  Lactic acid 3.2 initially but improved to normal on repeat.  Urinalysis with hemoglobin only.  Sputum  culture and blood culture pending.  Chest x-ray with left lower lobe opacities that were increasing though unclear what imaging this was compared to.  Also noted was left pleural effusion.  CT chest showed left upper lobe changes consistent with pneumonia as well as left lower lobe and lingular nodular disease but could be infectious versus inflammatory but hard to rule out malignancy.  Patient noted to have moderate left pleural effusion, small scattered ground glass opacities, scarring, emphysema.  Review of Systems: As per HPI otherwise all other systems reviewed and are negative.  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 Rockey Peru.    Peripheral vascular disease  Abdominal Aortic Aneursym   Pneumonia    as a child   Radiation 09/18/15-10/25/15   left lower lobe 70.2 Gy   Seizures (HCC) 02/18/2020   Tobacco abuse     Past Surgical History:  Procedure Laterality Date   CHOLECYSTECTOMY N/A 07/23/2017   Procedure: LAPAROSCOPIC CHOLECYSTECTOMY;  Surgeon: Kinsinger, Herlene Righter, MD;   Location: [facility] ORS;  Service: General;  Laterality: N/A;   CLIPPING OF ATRIAL APPENDAGE Left 05/08/2021   Procedure: CLIPPING OF ATRIAL APPENDAGE USING 45 ATRICLIP;  Surgeon: German Bartlett PEDLAR, MD;  Location: [facility] OR;  Service: Open Heart Surgery;  Laterality: Left;   COLONOSCOPY     CORONARY ARTERY BYPASS GRAFT N/A 05/08/2021   Procedure: CORONARY ARTERY BYPASS GRAFTING (CABG)X 3 USING LEFT INTERNAL MAMMARY ARTERY AND RIGHT GREATER SAPEHNOUS VEIN;  Surgeon: German Bartlett PEDLAR, MD;  Location: [facility] OR;  Service: Open Heart Surgery;  Laterality: N/A;   ENDOVEIN HARVEST OF GREATER SAPHENOUS VEIN Right 05/08/2021   Procedure: ENDOVEIN HARVEST OF GREATER SAPHENOUS VEIN;  Surgeon: German Bartlett PEDLAR, MD;  Location: [facility] OR;  Service: Open Heart Surgery;  Laterality: Right;   EYE SURGERY Bilateral    Cataracts removed w/ lens implant   HERNIA REPAIR     Left 36 years ago . Right inguinal hernia repair 10-01-17 Dr. Stevie   INGUINAL HERNIA REPAIR Right 10/01/2017   Procedure: RIGHT INGUINAL HERNIA REPAIR WITH MESH;  Surgeon: Kinsinger, Herlene Righter, MD;  Location: [facility];  Service: General;  Laterality: Right;  TAP BLOCK   INSERTION OF MESH Right 10/01/2017   Procedure: INSERTION OF MESH;  Surgeon: Stevie Herlene Righter, MD;  Location: [facility];  Service: General;  Laterality: Right;   IR THORACENTESIS ASP PLEURAL SPACE W/IMG GUIDE  05/18/2021   IR THORACENTESIS ASP PLEURAL SPACE W/IMG GUIDE  06/07/2021   LEFT HEART CATH AND CORONARY ANGIOGRAPHY N/A 04/20/2021   Procedure: LEFT HEART CATH AND CORONARY ANGIOGRAPHY;  Surgeon: Darron Deatrice LABOR, MD;  [facility]: [facility];  Service: Cardiovascular;  Laterality: N/A;   TEE WITHOUT CARDIOVERSION N/A 05/08/2021   Procedure: TRANSESOPHAGEAL ECHOCARDIOGRAM (TEE);  Surgeon: German Bartlett PEDLAR, MD;  Location: [facility] OR;  Service: Open Heart Surgery;  Laterality: N/A;   TONSILLECTOMY     TONSILLECTOMY     VIDEO BRONCHOSCOPY Bilateral 07/26/2015   Procedure: VIDEO BRONCHOSCOPY WITH  FLUORO;  Surgeon: Ozell KATHEE America, MD;  Location: [facility];  Service: Cardiopulmonary;  Laterality: Bilateral;   VIDEO BRONCHOSCOPY WITH ENDOBRONCHIAL NAVIGATION N/A 08/23/2015   Procedure: VIDEO BRONCHOSCOPY WITH ENDOBRONCHIAL NAVIGATION;  Surgeon: Dallas KATHEE Jude, MD;  [facility]: [facility];  Service: Thoracic;  Laterality: N/A;   VIDEO BRONCHOSCOPY WITH ENDOBRONCHIAL ULTRASOUND N/A 08/23/2015   Procedure: VIDEO BRONCHOSCOPY WITH ENDOBRONCHIAL ULTRASOUND;  Surgeon: Dallas KATHEE Jude, MD;  Location: [facility];  Service: Thoracic;  Laterality: N/A;    Social History  reports that he quit smoking about 9 years ago. His smoking use included cigarettes and cigars. He started smoking about 66 years ago. He has a 57 pack-year smoking history. He quit smokeless tobacco use about 66 years ago.  His smokeless tobacco use included chew. He reports that he does not currently use alcohol. He reports that he does not use drugs.  Allergies[1]  Family History  Problem Relation Age of Onset   Leukemia Father    Emphysema Father    Learning disabilities Son    Atrial fibrillation Son    Leukemia Other    Stroke Other   Reviewed  on admission  Prior to Admission medications  Medication Sig Start Date End Date Taking? Authorizing Provider  acetaminophen  (TYLENOL ) 500 MG tablet Take 1-2 tablets (500-1,000 mg total) by mouth every 6 (six) hours as needed. 05/16/21   Zimmerman, Donielle M, PA-C  amiodarone  (PACERONE ) 200 MG tablet Take 1 tablet (200 mg total) by mouth daily. 10/15/23   Nahser, Aleene PARAS, MD  atorvastatin  (LIPITOR ) 80 MG tablet Take 1 tablet (80 mg total) by mouth daily. 04/30/24   O'Sullivan, Melissa, NP  budesonide -glycopyrrolate -formoterol  (BREZTRI  AEROSPHERE) 160-9-4.8 MCG/ACT AERO inhaler Inhale 2 puffs into the lungs 2 (two) times daily. 04/14/24   O'Sullivan, Melissa, NP  Cyanocobalamin (VITAMIN B-12 CR PO) Take 1 tablet by mouth daily.    [provider]  dapagliflozin  propanediol  (FARXIGA ) 5 MG TABS tablet Take 1 tablet (5 mg total) by mouth daily. 10/13/24   O'Sullivan, Melissa, NP  diclofenac  Sodium (VOLTAREN ) 1 % GEL Apply 4 g topically 4 (four) times daily. 08/13/24   Emil Share, DO  diltiazem  (CARDIZEM  CD) 120 MG 24 hr capsule Take 1 capsule (120 mg total) by mouth daily. 01/28/24   O'Sullivan, Melissa, NP  furosemide  (LASIX ) 40 MG tablet Take 1 tablet (40 mg total) by mouth daily. 06/22/24   O'Sullivan, Melissa, NP  gabapentin  (NEURONTIN ) 100 MG capsule Take 1 capsule (100 mg total) by mouth 3 (three) times daily. 07/21/24   O'Sullivan, Melissa, NP  levalbuterol  (XOPENEX ) 0.63 MG/3ML nebulizer solution Take 3 mLs (0.63 mg total) by nebulization every 4 (four) hours as needed for wheezing or shortness of breath. 04/14/24   O'Sullivan, Melissa, NP  lisinopril  (ZESTRIL ) 2.5 MG tablet Take 1 tablet (2.5 mg total) by mouth daily. 06/26/24   O'Sullivan, Melissa, NP  omeprazole  (PRILOSEC) 40 MG capsule Take 1 capsule (40 mg total) by mouth daily. 01/28/24   O'Sullivan, Melissa, NP  ondansetron  (ZOFRAN ) 4 MG tablet Take 1 tablet (4 mg total) by mouth every 8 (eight) hours as needed for nausea or vomiting. 09/27/24   Daryl Setter, NP  potassium chloride  (KLOR-CON ) 10 MEQ tablet Take 10 mEq by mouth daily.    [provider]  sertraline  (ZOLOFT ) 50 MG tablet Take 1 tablet (50 mg total) by mouth daily. Patient not taking: Reported on 10/13/2024 05/21/24   O'Sullivan, Melissa, NP  spironolactone  (ALDACTONE ) 25 MG tablet Take 1 tablet (25 mg total) by mouth daily. 06/26/24   O'Sullivan, Melissa, NP  tamsulosin  (FLOMAX ) 0.4 MG CAPS capsule Take 1 capsule (0.4 mg total) by mouth at bedtime. 06/11/24   O'Sullivan, Melissa, NP  XARELTO  20 MG TABS tablet TAKE 1 TABLET BY MOUTH EVERY DAY 10/10/24   Daryl Setter, NP    Physical Exam: Vitals:   11/06/24 1200 11/06/24 1212 11/06/24 1215 11/06/24 1340  BP: 109/79   111/66  Pulse:   98 (!) 108  Resp: 17  17 18   Temp:  97.9  F (36.6 C)  (!) 97.5 F (36.4 C)  TempSrc:  Oral  Oral  SpO2:   100% 99%  Weight:        Physical Exam Constitutional:      General: He is not in acute distress.    Appearance: Normal appearance.  HENT:     Head: Normocephalic and atraumatic.     Mouth/Throat:     Mouth: Mucous membranes are moist.     Pharynx: Oropharynx is clear.  Eyes:     Extraocular Movements: Extraocular movements intact.     Pupils: Pupils are equal, round,  and reactive to light.  Cardiovascular:     Rate and Rhythm: Tachycardia present. Rhythm irregular.     Pulses: Normal pulses.     Heart sounds: Normal heart sounds.  Pulmonary:     Effort: Pulmonary effort is normal. No respiratory distress.     Breath sounds: Normal breath sounds.  Abdominal:     General: Bowel sounds are normal. There is no distension.     Palpations: Abdomen is soft.     Tenderness: There is no abdominal tenderness.  Musculoskeletal:        General: No swelling or deformity.     Right lower leg: Edema present.     Left lower leg: Edema present.  Skin:    General: Skin is warm and dry.  Neurological:     General: No focal deficit present.     Mental Status: Mental status is at baseline.    Labs on Admission: I have personally reviewed following labs and imaging studies  CBC: Recent Labs  Lab 11/05/24 1702  WBC 17.4*  NEUTROABS 14.4*  HGB 11.0*  HCT 36.0*  MCV 83.1  PLT 496*    Basic Metabolic Panel: Recent Labs  Lab 11/05/24 1702 11/05/24 1716  NA 138  --   K 4.3  --   CL 98  --   CO2 25  --   GLUCOSE 211*  --   BUN 30*  --   CREATININE 0.85  --   CALCIUM  9.0  --   MG  --  2.0    GFR: Estimated Creatinine Clearance: 65.8 mL/min (by C-G formula based on SCr of 0.85 mg/dL).  Liver Function Tests: Recent Labs  Lab 11/05/24 1702  AST 19  ALT 20  ALKPHOS 92  BILITOT 0.3  PROT 5.8*  ALBUMIN  3.3*    Urine analysis:    Component Value Date/Time   COLORURINE YELLOW 11/05/2024 1702    APPEARANCEUR CLEAR 11/05/2024 1702   LABSPEC 1.015 11/05/2024 1702   PHURINE 6.0 11/05/2024 1702   GLUCOSEU NEGATIVE 11/05/2024 1702   GLUCOSEU NEGATIVE 04/27/2018 1509   HGBUR TRACE (A) 11/05/2024 1702   HGBUR trace-lysed 06/08/2010 1317   BILIRUBINUR NEGATIVE 11/05/2024 1702   BILIRUBINUR neg 10/14/2023 1403   KETONESUR NEGATIVE 11/05/2024 1702   PROTEINUR NEGATIVE 11/05/2024 1702   UROBILINOGEN 0.2 10/14/2023 1403   UROBILINOGEN 0.2 04/27/2018 1509   NITRITE NEGATIVE 11/05/2024 1702   LEUKOCYTESUR NEGATIVE 11/05/2024 1702    Radiological Exams on Admission: CT Chest Wo Contrast Result Date: 11/05/2024 EXAM: CT CHEST WITHOUT CONTRAST 11/05/2024 06:44:00 PM TECHNIQUE: CT of the chest was performed without the administration of intravenous contrast. Multiplanar reformatted images are provided for review. Automated exposure control, iterative reconstruction, and/or weight based adjustment of the mA/kV was utilized to reduce the radiation dose to as low as reasonably achievable. COMPARISON: Chest x-ray today. Chest CT 03/2024. CLINICAL HISTORY: Cough, chronic/persisting > 8 weeks, failed empiric treatment. FINDINGS: MEDIASTINUM: Heart and pericardium are unremarkable. The central airways are clear. Diffuse aortic atherosclerosis. Large hiatal hernia. LYMPH NODES: No mediastinal, hilar or axillary lymphadenopathy. LUNGS AND PLEURA: Mild emphysema. Moderate left pleural effusion, increasing since prior CT. Soft tissue contiguous with the left hilum and extending throughout the left lung to the left lateral pleural surface. This is unchanged since the prior study and may reflect scarring. Airspace disease throughout the left upper lobe concerning for pneumonia. Nodular airspace disease in the lingula and left lower lobe which could be infectious/inflammatory, although neoplasm cannot be excluded.  Scattered clustered nodular densities in the right upper lobe. No pneumothorax. SOFT TISSUES/BONES: No acute  abnormality of the bones or soft tissues. UPPER ABDOMEN: Limited images of the upper abdomen demonstrates no acute abnormality. IMPRESSION: 1. Airspace disease throughout the left upper lobe concerning for pneumonia. 2. Nodular airspace disease in the lingula and left lower lobe, possibly infectious/inflammatory, although neoplasm cannot be excluded; follow-up chest CT after treatment is recommended to document resolution. 3. Moderate left pleural effusion, increasing since prior CT. 4. Small scattered clustered ground-glass nodules in the right upper lobe. Recommended attention on follow-up imaging. 5. Soft tissue extends from the left hilum throughout the left upper lobe to the lateral left pleural surface and is stable since the prior study, favor scarring. 6. Emphysema. Electronically signed by: Franky Crease MD 11/05/2024 07:28 PM EST RP Workstation: [id number]   DG Chest Portable 1 View Result Date: 11/05/2024 EXAM: 1 VIEW(S) XRAY OF THE CHEST 11/05/2024 05:46:00 PM COMPARISON: 07/30/2024 CLINICAL HISTORY: CP FINDINGS: LUNGS AND PLEURA: Interval worsening of airspace opacity in the left upper lobe. Interval increased at least small volume left pleural effusion. No pneumothorax. HEART AND MEDIASTINUM: Aortic calcification. Left atrial clip noted. BONES AND SOFT TISSUES: Sternotomy and cardiac surgical changes noted. IMPRESSION: 1. Interval worsening of airspace opacity in the left upper lobe. 2. Interval increased at least small volume left pleural effusion. Electronically signed by: Morgane Naveau MD 11/05/2024 06:40 PM EST RP Workstation: [id number]   EKG: Independently reviewed.  Initial EKG showed atrial fibrillation with RVR at 130 bpm.  Nonspecific T wave changes.  Minimal baseline wander.  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)   COPD GOLD II    Diabetes type 2, controlled (HCC)   Seizure disorder (HCC)   Benign prostatic hyperplasia with nocturia   Coronary artery disease   S/P CABG x 3   Diabetic peripheral neuropathy (HCC)   Acute on chronic diastolic CHF (congestive heart failure) (HCC)   Pure hypercholesterolemia   Anxiety   Chronic back pain   Paroxysmal atrial fibrillation with RVR (HCC)   Acute respiratory failure with hypoxia Acute on chronic diastolic CHF > Patient presented with worsening shortness of breath despite being discharged on the day prior after a 17-day hospitalization at [facility]. > Last echocardiogram in our system was in June and showed EF 60-6%, G2 DD, normal RV function.  Echocardiogram was repeated at [facility] on 12/23 and showed continued EF 60-6 5%, there was no comment on diastolic function, normal RV function. > Did receive some diuresis at outside hospital/treatment for CHF exacerbation per chart review, this may have been incomplete versus more rapid exacerbation in the setting of A-fib with RVR. > proBNP indeterminate at 981.  Troponin flat at 27, 25.  Magnesium  normal.  Chest x-ray and CT chest more notable for opacities than edema however does have lower extremity edema. > Received Lasix  60 mg IV in the ED. - Monitor in progressive unit - Continue with Lasix  60 mg IV twice daily - Strict I's and O's, daily weights - No need to repeat echocardiogram - Trend renal function and electrolytes - If BP tolerates, resume home spironolactone  and lisinopril   Atrial fibrillation with RVR > Rates in the 130s-140s initially in the ED. >  Had RVR at outside hospital and had his diltiazem  uptitrated to 240 mg.  Also is on amiodarone  200 mg daily. > Placed on amiodarone  infusion in the ED with improvement. - Monitoring on progressive unit as above - Will restart diltiazem  and amiodarone , wean off  infusion 1 to 2 hours after this. - Continue Eliquis , (switched from Xarelto  to Eliquis  after recent admission where he was found to have PE.)  ?Pneumonia > Patient with changes on imaging in the ED consistent with pneumonia however was just recently treated at outside hospital for pneumonia.  Unclear if these are residual changes versus recurrence.  Did have worsening leukocytosis compared to discharge labs. > In setting of possible HCAP patient was started on broad-spectrum antibiotics with vancomycin  and cefepime  in the ED. - Monitoring on progressive as above - Will continue with vancomycin  and cefepime  for now - Check procalcitonin - Follow-up sputum cultures and blood cultures  Pulmonary embolism > Noted to have elevated D-dimer during recent admission and VQ scan consistent with PE.  Transition from Xarelto  to Eliquis  in the setting during admission. - Continue Eliquis   Hypertension - Continue diuresis - Resume spironolactone  and lisinopril  as tolerated  Hyperlipidemia - Continue atorvastatin   GERD - Continue PPI  Diabetes - SSI  Neuropathy - Continue Gabapentin  when confirmed  History of seizure disorder - No longer on Keppra   Anxiety - Continue home sertraline  when confirmed  Lung cancer Recurrent pleural effusion > Patient has history of lung cancer status post radiation treatment. > In the setting, has recurrent left pleural effusion status multiple thoracenteses.  This was done during most recent admission and was negative for any malignant cells. > Pulmonology at outside hospital commented that likely has some degree of radiation fibrosis that is contributing to these recurrent pleural effusions. > Noted to have continued moderate left pleural effusion. > CT with some changes unable to rule out malig but recent throa negative as above. - Noted  COPD - Continue home Breztri  and as needed Xopenex   BPH - Continue tamsulosin   DVT prophylaxis: Eliquis  Code  Status:   DNR, Okay with temporary Intubation. Family Communication:  None on admission  Disposition Plan:   Patient is from:  Home  Anticipated DC to:  Home  Anticipated DC date:  2 to 5 days  Anticipated DC barriers: None  Consults called:  None Admission status:  Inpatient, progressive  Severity of Illness: The appropriate patient status for this patient is INPATIENT. Inpatient status is judged to be reasonable and necessary in order to provide the required intensity of service to ensure the patient's safety. The patient's presenting symptoms, physical exam findings, and initial radiographic and laboratory data in the context of their chronic comorbidities is felt to place them at high risk for further clinical deterioration. Furthermore, it is not anticipated that the patient will be medically stable for discharge from the hospital within 2 midnights of admission.   * I certify that at the point of admission it is my clinical judgment that the patient will require inpatient hospital care spanning beyond 2 midnights from the point of admission due to high intensity of service, high risk for further deterioration and high frequency of surveillance required.Robert Marsa KATHEE Seena MD [facility]  How to contact the [facility] Attending or Consulting provider 7A - 7P or covering provider during after hours 7P -7A, for this patient?   Check the care team in [facility] and look for a) attending/consulting [facility] provider listed and b) the  [facility] team listed Log into [email or web address] and use [facility]'s universal password to access. If you do not have the password, please contact the hospital operator. Locate the [facility] provider you are looking for under [facility] and page to a number that you can be directly reached. If you still have difficulty reaching the provider, please page the [facility] (Director on Call) for the Hospitalists listed on amion for assistance.  11/06/2024, 2:40 PM       [1]   Allergies Allergen Reactions   Iodine Swelling    Neck, gland swelling   Iohexol  Swelling    Neck, gland swelling   Metformin  And Related Diarrhea   Metformin  Nausea Only   "

## 2024-11-06 NOTE — Plan of Care (Signed)

## 2024-11-06 NOTE — Progress Notes (Signed)
" ° °  Brief Progress Note   _____________________________________________________________________________________________________________  Patient Name: Robert Bates Patient DOB: [birth year] Date: 11/06/2024     Data: Patient being admitted to [facility].    Action: Patient assigned to CM 3E04.    Response:  Sent secure chat to [employer] that bed as been assigned but still dirty.   ADDENDUM 1143: Sent secure chat that bed is ready on bed board.  _____________________________________________________________________________________________________________  The [facility] RN Expeditor Kyliee Ortego Please contact us  directly via secure chat (search for [facility]) or by calling us  at [phone] [facility]).  "

## 2024-11-07 DIAGNOSIS — J9601 Acute respiratory failure with hypoxia: Secondary | ICD-10-CM | POA: Diagnosis not present

## 2024-11-07 LAB — CBC
HCT: 28.5 % — ABNORMAL LOW (ref 39.0–52.0)
Hemoglobin: 8.8 g/dL — ABNORMAL LOW (ref 13.0–17.0)
MCH: 25.5 pg — ABNORMAL LOW (ref 26.0–34.0)
MCHC: 30.9 g/dL (ref 30.0–36.0)
MCV: 82.6 fL (ref 80.0–100.0)
Platelets: 349 K/uL (ref 150–400)
RBC: 3.45 MIL/uL — ABNORMAL LOW (ref 4.22–5.81)
RDW: 17.9 % — ABNORMAL HIGH (ref 11.5–15.5)
WBC: 12.3 K/uL — ABNORMAL HIGH (ref 4.0–10.5)
nRBC: 0 % (ref 0.0–0.2)

## 2024-11-07 LAB — GLUCOSE, CAPILLARY
Glucose-Capillary: 97 mg/dL (ref 70–99)
Glucose-Capillary: 124 mg/dL — ABNORMAL HIGH (ref 70–99)
Glucose-Capillary: 147 mg/dL — ABNORMAL HIGH (ref 70–99)
Glucose-Capillary: 141 mg/dL — ABNORMAL HIGH (ref 70–99)

## 2024-11-07 LAB — HEPARIN LEVEL (UNFRACTIONATED): Heparin Unfractionated: >1.1 [IU]/mL — ABNORMAL HIGH (ref 0.30–0.70)

## 2024-11-07 LAB — APTT
aPTT: 36 s (ref 24–36)
aPTT: 157 s — ABNORMAL HIGH (ref 24–36)

## 2024-11-07 LAB — COMPREHENSIVE METABOLIC PANEL WITH GFR
ALT: 16 U/L (ref 0–44)
AST: 12 U/L — ABNORMAL LOW (ref 15–41)
Albumin: 2.6 g/dL — ABNORMAL LOW (ref 3.5–5.0)
Alkaline Phosphatase: 71 U/L (ref 38–126)
Anion gap: 8 (ref 5–15)
BUN: 25 mg/dL — ABNORMAL HIGH (ref 8–23)
CO2: 30 mmol/L (ref 22–32)
Calcium: 7.8 mg/dL — ABNORMAL LOW (ref 8.9–10.3)
Chloride: 98 mmol/L (ref 98–111)
Creatinine, Ser: 0.71 mg/dL (ref 0.61–1.24)
GFR, Estimated: >60 mL/min
Glucose, Bld: 118 mg/dL — ABNORMAL HIGH (ref 70–99)
Potassium: 3.4 mmol/L — ABNORMAL LOW (ref 3.5–5.1)
Sodium: 136 mmol/L (ref 135–145)
Total Bilirubin: 0.4 mg/dL (ref 0.0–1.2)
Total Protein: 4.6 g/dL — ABNORMAL LOW (ref 6.5–8.1)

## 2024-11-07 LAB — PROCALCITONIN: Procalcitonin: <0.1 ng/mL

## 2024-11-07 MED ORDER — HEPARIN (PORCINE) 25000 UT/250ML-% IV SOLN
1250.0000 [IU]/h | INTRAVENOUS | Status: DC
  Administered 2024-11-08: 1250 [IU]/h via INTRAVENOUS
  Filled 2024-11-07: qty 250

## 2024-11-07 MED ORDER — HEPARIN (PORCINE) 25000 UT/250ML-% IV SOLN
1400.0000 [IU]/h | INTRAVENOUS | Status: DC
  Filled 2024-11-07: qty 250

## 2024-11-07 MED ORDER — AMIODARONE HCL 200 MG PO TABS
200.0000 mg | ORAL_TABLET | Freq: Every day | ORAL | Status: DC
  Administered 2024-11-07 – 2024-11-12 (×6): 200 mg via ORAL
  Filled 2024-11-07 (×6): qty 1

## 2024-11-07 MED ORDER — HEPARIN (PORCINE) 25000 UT/250ML-% IV SOLN
1400.0000 [IU]/h | INTRAVENOUS | Status: DC
  Administered 2024-11-07: 1400 [IU]/h via INTRAVENOUS

## 2024-11-07 MED ORDER — VANCOMYCIN HCL 1250 MG/250ML IV SOLN
1250.0000 mg | INTRAVENOUS | Status: DC
  Administered 2024-11-07: 1250 mg via INTRAVENOUS
  Filled 2024-11-07: qty 250

## 2024-11-07 MED ORDER — POTASSIUM CHLORIDE CRYS ER 20 MEQ PO TBCR
40.0000 meq | EXTENDED_RELEASE_TABLET | Freq: Two times a day (BID) | ORAL | Status: AC
  Administered 2024-11-07 – 2024-11-08 (×2): 40 meq via ORAL
  Filled 2024-11-07 (×2): qty 2

## 2024-11-07 MED ORDER — GABAPENTIN 100 MG PO CAPS
100.0000 mg | ORAL_CAPSULE | Freq: Three times a day (TID) | ORAL | Status: DC
  Administered 2024-11-07 – 2024-11-12 (×17): 100 mg via ORAL
  Filled 2024-11-07 (×17): qty 1

## 2024-11-07 MED ORDER — SPIRONOLACTONE 25 MG PO TABS
25.0000 mg | ORAL_TABLET | Freq: Every day | ORAL | Status: DC
  Administered 2024-11-07 – 2024-11-12 (×6): 25 mg via ORAL
  Filled 2024-11-07 (×6): qty 1

## 2024-11-07 MED ORDER — VANCOMYCIN HCL 1250 MG/250ML IV SOLN: 1250.0000 mg | INTRAVENOUS | Status: DC

## 2024-11-07 NOTE — Plan of Care (Signed)
" °  Problem: Clinical Measurements: Goal: Will remain free from infection Outcome: Progressing   Problem: Clinical Measurements: Goal: Respiratory complications will improve Outcome: Progressing   Problem: Clinical Measurements: Goal: Diagnostic test results will improve Outcome: Progressing   Problem: Education: Goal: Knowledge of General Education information will improve Description: Including pain rating scale, medication(s)/side effects and non-pharmacologic comfort measures Outcome: Progressing   "

## 2024-11-07 NOTE — Progress Notes (Addendum)
 " PROGRESS NOTE    Robert Bates  FMW:[id number] DOB: 1941-08-25 DOA: 11/05/2024 PCP: Daryl Setter, NP   83/M with history of diastolic CHF, COPD, non-small cell lung cancer SP XRT, currently under observation, CAD/CABG, type 2 diabetes mellitus, seizure disorder, neuropathy, anxiety, recurrent pleural effusion, BPH, prior chronic pain, recent long hospitalization at [facility] from 12/15-1/1 for CHF exacerbation A-fib RVR, pleural effusion and concern for PE on VQ scan, treated with diuretics, thoracentesis and started on Eliquis  prior to discharge, cytology was negative for malignant cells then.  Could not get approval for SNF eventually went to a motel, reported to be currently homeless.  Presented to the ED with worsening shortness of breath cough.  In the ED he was noted to be in A-fib, mildly tachypneic, on 3 L O2, labs noted hemoglobin 11, WBC 17, troponin 27, proBNP 981, chest x-ray with lower lobe opacities and left pleural effusion, CT chest noted left upper lobe pneumonia, left lower lobe lingular nodal disease could be infectious versus inflammatory, moderate left pleural effusion, scattered ground glass opacities, scarring and emphysema  Subjective: -Feels a little better, breathing is improving  Assessment and Plan:  Acute respiratory failure with hypoxia Acute on chronic diastolic CHF -Presenting with recurrent respiratory failure after 17-day hospitalization at OSH - Echo in 6/25 noted EF of 60-65%, grade 2 DD, normal RV - Suspect his pleural effusions are multifactorial, continue IV Lasix  today, Farxiga  -Restart Aldactone , hold lisinopril  -Thoracentesis as noted below  Stage II non-small cell lung cancer  Recurrent pleural effusion -Patient has history of lung cancer status post radiation treatment. - History of recurrent left pleural effusion, multiple thoracentesis, cytology in December was negative for malignant cells -Felt to be related to combination of CHF  and radiation fibrosis -Lung cancer is followed by Dr. Gatha, currently under observation only -Will repeat x-ray tomorrow, if still moderate left effusion will attempt thoracentesis, hold Eliquis  today   Atrial fibrillation with RVR - Rates in the 130s-140s initially in the ED. was briefly on IV amiodarone , now off -Start oral amiodarone , resume diltiazem  -Stop Eliquis  in case he needs thoracentesis, start IV heparin ,  ?Pneumonia > Patient with changes on imaging in the ED consistent with pneumonia however was just recently treated at outside hospital for pneumonia.  -Unclear if this is residual versus recurrent pneumonia -Continue cefepime  today, discontinue vancomycin  tomorrow if cultures remain negative -Check SLP eval  Recent pulmonary embolism > Noted to have elevated D-dimer during recent admission and VQ scan consistent with PE.  Transition from Xarelto  to Eliquis  in the setting during admission. - Holding Eliquis  for possible Thora, started on IV heparin    Hypertension - Continue diuresis - Resume spironolactone     Hyperlipidemia - Continue atorvastatin    GERD - Continue PPI   Diabetes - SSI   Neuropathy - Continue Gabapentin  when confirmed   History of seizure disorder - No longer on Keppra    Anxiety - Continue home sertraline  when confirmed   COPD - Continue home Breztri  and as needed Xopenex    BPH - Continue tamsulosin   Goals: Chronically ill male with multifactorial respiratory failure and frequent hospitalizations, will request palliative consult for goals of care   DVT prophylaxis:      Eliquis  Code Status:              DNR,  Family Communication:       None on admission  Disposition Plan: To be determined  Consultants:    Procedures:   Antimicrobials:  Objective: Vitals:   11/07/24 0444 11/07/24 0728 11/07/24 0747 11/07/24 0802  BP: 99/72  103/67   Pulse: 92 84    Resp: 17 15 (!) 23   Temp: 97.7 F (36.5 C) (!) 97 F (36.1 C)     TempSrc: Oral Oral    SpO2: 99% 95%  96%  Weight: 75.6 kg       Intake/Output Summary (Last 24 hours) at 11/07/2024 0956 Last data filed at 11/06/2024 2348 Gross per 24 hour  Intake 480 ml  Output 1750 ml  Net -1270 ml   Filed Weights   11/05/24 1659 11/07/24 0444  Weight: 77.1 kg 75.6 kg    Examination:  General exam: Chronically ill-appearing, AAO x 3 HEENT: Positive JVD CVS: S1-S2, irregular rhythm Lungs: Decreased breath sounds at the bases Abdomen: Soft, nontender, bowel sounds present  EXTR: 1+ edema Skin: No rashes Psychiatry:  Mood & affect appropriate.     Data Reviewed:   CBC: Recent Labs  Lab 11/05/24 1702 11/06/24 1455 11/07/24 0158  WBC 17.4* 12.3* 12.3*  NEUTROABS 14.4*  --   --   HGB 11.0* 10.1* 8.8*  HCT 36.0* 32.9* 28.5*  MCV 83.1 84.4 82.6  PLT 496* 386 349   Basic Metabolic Panel: Recent Labs  Lab 11/05/24 1702 11/05/24 1716 11/06/24 1455 11/07/24 0158  NA 138  --  136 136  K 4.3  --  3.9 3.4*  CL 98  --  99 98  CO2 25  --  29 30  GLUCOSE 211*  --  134* 118*  BUN 30*  --  25* 25*  CREATININE 0.85  --  0.63 0.71  CALCIUM  9.0  --  8.3* 7.8*  MG  --  2.0  --   --    GFR: Estimated Creatinine Clearance: 70 mL/min (by C-G formula based on SCr of 0.71 mg/dL). Liver Function Tests: Recent Labs  Lab 11/05/24 1702 11/06/24 1455 11/07/24 0158  AST 19 16 12*  ALT 20 20 16   ALKPHOS 92 86 71  BILITOT 0.3 0.4 0.4  PROT 5.8* 5.4* 4.6*  ALBUMIN  3.3* 2.9* 2.6*   No results for input(s): LIPASE, AMYLASE in the last 168 hours. No results for input(s): AMMONIA in the last 168 hours. Coagulation Profile: No results for input(s): INR, PROTIME in the last 168 hours. Cardiac Enzymes: No results for input(s): CKTOTAL, CKMB, CKMBINDEX, TROPONINI in the last 168 hours. BNP (last 3 results) Recent Labs    07/30/24 1650 11/05/24 1702  PROBNP 914.0* 981.0*   HbA1C: No results for input(s): HGBA1C in the last 72  hours. CBG: Recent Labs  Lab 11/06/24 0810 11/06/24 1120 11/06/24 1736 11/06/24 2111 11/07/24 0639  GLUCAP 108* 114* 130* 153* 97   Lipid Profile: No results for input(s): CHOL, HDL, LDLCALC, TRIG, CHOLHDL, LDLDIRECT in the last 72 hours. Thyroid  Function Tests: No results for input(s): TSH, T4TOTAL, FREET4, T3FREE, THYROIDAB in the last 72 hours. Anemia Panel: No results for input(s): VITAMINB12, FOLATE, FERRITIN, TIBC, IRON, RETICCTPCT in the last 72 hours. Urine analysis:    Component Value Date/Time   COLORURINE YELLOW 11/05/2024 1702   APPEARANCEUR CLEAR 11/05/2024 1702   LABSPEC 1.015 11/05/2024 1702   PHURINE 6.0 11/05/2024 1702   GLUCOSEU NEGATIVE 11/05/2024 1702   GLUCOSEU NEGATIVE 04/27/2018 1509   HGBUR TRACE (A) 11/05/2024 1702   HGBUR trace-lysed 06/08/2010 1317   BILIRUBINUR NEGATIVE 11/05/2024 1702   BILIRUBINUR neg 10/14/2023 1403   KETONESUR NEGATIVE 11/05/2024 1702   PROTEINUR NEGATIVE  11/05/2024 1702   UROBILINOGEN 0.2 10/14/2023 1403   UROBILINOGEN 0.2 04/27/2018 1509   NITRITE NEGATIVE 11/05/2024 1702   LEUKOCYTESUR NEGATIVE 11/05/2024 1702   Sepsis Labs: @LABRCNTIP (procalcitonin:4,lacticidven:4)  ) Recent Results (from the past 240 hours)  Blood culture (routine x 2)     Status: None (Preliminary result)   Collection Time: 11/05/24  8:00 PM   Specimen: BLOOD  Result Value Ref Range Status   Specimen Description   Final    BLOOD LEFT ANTECUBITAL Performed at [facility], [address] [place], [place] [address]    Special Requests   Final    BOTTLES DRAWN AEROBIC AND ANAEROBIC Blood Culture adequate volume Performed at [facility], [address] [place], [place] [address]    Culture   Final    NO GROWTH 1 DAY Performed at [facility], [address] [place], [place] [address]    Report Status PENDING  Incomplete  Blood culture (routine x 2)     Status: None  (Preliminary result)   Collection Time: 11/05/24  8:10 PM   Specimen: BLOOD  Result Value Ref Range Status   Specimen Description   Final    BLOOD RIGHT ANTECUBITAL Performed at [facility], [address] [place], [place] [address]    Special Requests   Final    BOTTLES DRAWN AEROBIC AND ANAEROBIC Blood Culture adequate volume Performed at [facility], [address] [place], [place] [address]    Culture   Final    NO GROWTH 1 DAY Performed at [facility], [address] [place], [place] [address]    Report Status PENDING  Incomplete     Radiology Studies: CT Chest Wo Contrast Result Date: 11/05/2024 EXAM: CT CHEST WITHOUT CONTRAST 11/05/2024 06:44:00 PM TECHNIQUE: CT of the chest was performed without the administration of intravenous contrast. Multiplanar reformatted images are provided for review. Automated exposure control, iterative reconstruction, and/or weight based adjustment of the mA/kV was utilized to reduce the radiation dose to as low as reasonably achievable. COMPARISON: Chest x-ray today. Chest CT 03/2024. CLINICAL HISTORY: Cough, chronic/persisting > 8 weeks, failed empiric treatment. FINDINGS: MEDIASTINUM: Heart and pericardium are unremarkable. The central airways are clear. Diffuse aortic atherosclerosis. Large hiatal hernia. LYMPH NODES: No mediastinal, hilar or axillary lymphadenopathy. LUNGS AND PLEURA: Mild emphysema. Moderate left pleural effusion, increasing since prior CT. Soft tissue contiguous with the left hilum and extending throughout the left lung to the left lateral pleural surface. This is unchanged since the prior study and may reflect scarring. Airspace disease throughout the left upper lobe concerning for pneumonia. Nodular airspace disease in the lingula and left lower lobe which could be infectious/inflammatory, although neoplasm cannot be excluded. Scattered clustered nodular densities in the right upper lobe. No  pneumothorax. SOFT TISSUES/BONES: No acute abnormality of the bones or soft tissues. UPPER ABDOMEN: Limited images of the upper abdomen demonstrates no acute abnormality. IMPRESSION: 1. Airspace disease throughout the left upper lobe concerning for pneumonia. 2. Nodular airspace disease in the lingula and left lower lobe, possibly infectious/inflammatory, although neoplasm cannot be excluded; follow-up chest CT after treatment is recommended to document resolution. 3. Moderate left pleural effusion, increasing since prior CT. 4. Small scattered clustered ground-glass nodules in the right upper lobe. Recommended attention on follow-up imaging. 5. Soft tissue extends from the left hilum throughout the left upper lobe to the lateral left pleural surface and is stable  since the prior study, favor scarring. 6. Emphysema. Electronically signed by: Franky Crease MD 11/05/2024 07:28 PM EST RP Workstation: [id number]   DG Chest Portable 1 View Result Date: 11/05/2024 EXAM: 1 VIEW(S) XRAY OF THE CHEST 11/05/2024 05:46:00 PM COMPARISON: 07/30/2024 CLINICAL HISTORY: CP FINDINGS: LUNGS AND PLEURA: Interval worsening of airspace opacity in the left upper lobe. Interval increased at least small volume left pleural effusion. No pneumothorax. HEART AND MEDIASTINUM: Aortic calcification. Left atrial clip noted. BONES AND SOFT TISSUES: Sternotomy and cardiac surgical changes noted. IMPRESSION: 1. Interval worsening of airspace opacity in the left upper lobe. 2. Interval increased at least small volume left pleural effusion. Electronically signed by: Morgane Naveau MD 11/05/2024 06:40 PM EST RP Workstation: [id number]     Scheduled Meds:  atorvastatin   80 mg Oral Daily   budesonide -glycopyrrolate -formoterol   2 puff Inhalation BID   dapagliflozin  propanediol  5 mg Oral Daily   diltiazem   240 mg Oral Daily   furosemide   60 mg Intravenous BID   gabapentin   100 mg Oral TID   insulin  aspart  0-5 Units Subcutaneous QHS   insulin   aspart  0-6 Units Subcutaneous TID WC   pantoprazole   40 mg Oral Daily   sodium chloride  flush  3 mL Intravenous Q12H   tamsulosin   0.4 mg Oral QHS   Continuous Infusions:  ceFEPime  (MAXIPIME ) IV 2 g (11/07/24 0442)   vancomycin  Stopped (11/07/24 0035)     LOS: 1 day    Time spent:    Sigurd Pac, MD [facility]   11/07/2024, 9:56 AM    "

## 2024-11-07 NOTE — Progress Notes (Addendum)
 Pharmacy Antibiotic Note  Robert Bates is a 84 y.o. male admitted on 11/05/2024 with generalized weakness, was admitted to [facility] and discharged yesterday for a PE.  Pharmacy has been consulted for vancomycin  and cefepime  dosing.  Plan: Due to improvement in renal function, Vancomycin  to be switched to 1250 q24 eAUC 453, IBW, 0.72, Scr 0.8 Continue Cefepime  2g q8h.  Follow culture data for de-escalation.  Monitor renal function for dose adjustments as indicated.   Weight: 75.6 kg (166 lb 10.7 oz)  Temp (24hrs), Avg:97.6 F (36.4 C), Min:97 F (36.1 C), Max:98.1 F (36.7 C)  Recent Labs  Lab 11/05/24 1702 11/05/24 1948 11/05/24 2249 11/06/24 1455 11/07/24 0158  WBC 17.4*  --   --  12.3* 12.3*  CREATININE 0.85  --   --  0.63 0.71  LATICACIDVEN  --  3.2* 1.9  --   --     Estimated Creatinine Clearance: 70 mL/min (by C-G formula based on SCr of 0.71 mg/dL).    Allergies[1]  Antimicrobials this admission: Cefepime  1/2 >>  Vancomycin  1/2 >>   Microbiology results: 1/2 BCx:  1/2 Sputum:   01/03 MRSA nares:   PHARMACY - ANTICOAGULATION CONSULT NOTE  Pharmacy Consult for Eliquis   Indication: pulmonary embolus  Allergies[2]  Patient Measurements: Weight: 75.6 kg (166 lb 10.7 oz)  Vital Signs: Temp: 97 F (36.1 C) (01/04 0728) Temp Source: Oral (01/04 0728) BP: 87/49 (01/04 0728) Pulse Rate: 84 (01/04 0728)  Labs: Recent Labs    11/05/24 1702 11/06/24 1455 11/07/24 0158  HGB 11.0* 10.1* 8.8*  HCT 36.0* 32.9* 28.5*  PLT 496* 386 349  CREATININE 0.85 0.63 0.71    Estimated Creatinine Clearance: 70 mL/min (by C-G formula based on SCr of 0.71 mg/dL).   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 Rockey [place].    Peripheral vascular disease    Abdominal Aortic Aneursym   Pneumonia    as a child   Radiation 09/18/15-10/25/15   left lower lobe 70.2 Gy   Seizures (HCC) 02/18/2020   Tobacco abuse     Assessment: Patient recently discharged from [facility] and has been unable to get med since discharge. Called facility last dose of Eliquis  was 1/1 AM. Patient was on Xarelto  and was swapped to Eliquis  on 12/29 due to new PE. Received 10mg  x1 on 12/29, 10mg  BIX on 12/30 and 12/31. 10mg  x1 on 1/1 in the AM prior to discharge.  Plan:  Patient has received 3 full days of loading dose Eliquis  for PE. Will start Eliqius 10mg  BID x 4 days followed by Eliquis  5mg  BID.  F/u s/sx of bleeding.  F/u TOC needs.   Powell Blush, PharmD, BCCCP 11/07/2024,8:43 AM        [1]  Allergies Allergen Reactions   Iodine Swelling    Neck, gland swelling   Iohexol  Swelling    Neck, gland swelling   Iodinated Contrast Media    Metformin  And Related Diarrhea   Metformin  Nausea Only  [2]  Allergies Allergen Reactions   Iodine Swelling    Neck, gland swelling   Iohexol  Swelling    Neck, gland swelling   Iodinated Contrast Media    Metformin  And Related Diarrhea   Metformin  Nausea Only

## 2024-11-07 NOTE — Progress Notes (Signed)
" °   11/06/24 1942  Assess: MEWS Score  Temp 98.1 F (36.7 C)  BP (!) 92/54  MAP (mmHg) 67  Pulse Rate (!) 103  ECG Heart Rate (!) 113  Resp 20  SpO2 98 %  O2 Device Nasal Cannula  O2 Flow Rate (L/min) 3 L/min  Assess: MEWS Score  MEWS Temp 0  MEWS Systolic 1  MEWS Pulse 2  MEWS RR 0  MEWS LOC 0  MEWS Score 3  MEWS Score Color Yellow  Assess: if the MEWS score is Yellow or Red  Were vital signs accurate and taken at a resting state? Yes  Does the patient meet 2 or more of the SIRS criteria? Yes  Does the patient have a confirmed or suspected source of infection? Yes  MEWS guidelines implemented  Yes, yellow  Treat  MEWS Interventions Considered administering scheduled or prn medications/treatments as ordered  Take Vital Signs  Increase Vital Sign Frequency  Yellow: Q2hr x1, continue Q4hrs until patient remains green for 12hrs  Escalate  MEWS: Escalate Yellow: Discuss with charge nurse and consider notifying provider and/or RRT  Notify: Charge Nurse/RN  Name of Charge Nurse/RN Notified Vertell, RN  Assess: SIRS CRITERIA  SIRS Temperature  0  SIRS Respirations  0  SIRS Pulse 1  SIRS WBC 1  SIRS Score Sum  2    "

## 2024-11-07 NOTE — Progress Notes (Signed)
 ANTICOAGULATION CONSULT NOTE  Pharmacy Consult for Heparin  Indication: VTE treatment  Allergies[1]  Patient Measurements: Weight: 75.6 kg (166 lb 10.7 oz) Heparin  Dosing Weight: 75.6  Vital Signs: Temp: 98 F (36.7 C) (01/04 1900) Temp Source: Oral (01/04 1900) BP: 102/74 (01/04 2000) Pulse Rate: 98 (01/04 2000)  Labs: Recent Labs    11/05/24 1702 11/06/24 1455 11/07/24 0158 11/07/24 1217 11/07/24 2144  HGB 11.0* 10.1* 8.8*  --   --   HCT 36.0* 32.9* 28.5*  --   --   PLT 496* 386 349  --   --   APTT  --   --   --  36 157*  HEPARINUNFRC  --   --   --  >1.10*  --   CREATININE 0.85 0.63 0.71  --   --     Estimated Creatinine Clearance: 70 mL/min (by C-G formula based on SCr of 0.71 mg/dL).   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 Rockey [place].    Peripheral vascular disease    Abdominal Aortic Aneursym   Pneumonia    as a child   Radiation 09/18/15-10/25/15   left lower lobe 70.2 Gy   Seizures (HCC) 02/18/2020   Tobacco abuse     Medications:   Medications Prior to Admission  Medication Sig Dispense Refill Last Dose/Taking   acetaminophen  (TYLENOL ) 500 MG tablet Take 1-2 tablets (500-1,000 mg total) by mouth every 6 (six) hours as needed. 30 tablet 0 11/05/2024   amiodarone  (PACERONE ) 200 MG tablet Take 1 tablet (200 mg total) by mouth daily. 90 tablet 3 Unknown   atorvastatin  (LIPITOR ) 80 MG tablet Take 1 tablet (80 mg total) by mouth daily. 90 tablet 1 Unknown   furosemide  (LASIX ) 20 MG tablet Take 20 mg by mouth daily.   Unknown   gabapentin  (NEURONTIN ) 100 MG capsule Take 1 capsule (100 mg total) by mouth 3 (three) times daily. 90 capsule 3 Unknown   omeprazole  (PRILOSEC) 40 MG capsule Take 1 capsule (40 mg total) by mouth daily. 90 capsule 1 Unknown   polyethylene glycol (MIRALAX  / GLYCOLAX ) 17 g packet Take 17 g by mouth 2 (two) times daily.   Unknown   sertraline  (ZOLOFT ) 50 MG tablet Take 1 tablet (50 mg total) by mouth daily. 30 tablet 3 Unknown   tamsulosin  (FLOMAX ) 0.4 MG CAPS capsule Take 1 capsule (0.4 mg total) by mouth at bedtime. 90 capsule 1 Unknown   apixaban  (ELIQUIS ) 5 MG  TABS tablet Take 2 tablets (10 mg total) by mouth 2 (two) times a day for 5 days, THEN 1 tablet (5 mg total) 2 (two) times a day.   Unknown   budesonide -glycopyrrolate -formoterol  (BREZTRI  AEROSPHERE) 160-9-4.8 MCG/ACT AERO inhaler Inhale 2 puffs into the lungs 2 (two) times daily. (Patient not taking: Reported on 11/07/2024)   Not Taking   Cyanocobalamin (VITAMIN B-12 CR PO) Take 1 tablet by mouth daily. (Patient not taking: Reported on 11/07/2024)   Not Taking   dapagliflozin  propanediol (FARXIGA ) 5 MG TABS tablet Take 1 tablet (5 mg total) by mouth daily. (Patient not taking: Reported on 11/07/2024) 30 tablet 1 Not Taking   diclofenac  Sodium (VOLTAREN ) 1 % GEL Apply 4 g topically 4 (four) times daily. 100 g 0 Unknown   diltiazem  (CARDIZEM  CD) 120 MG 24 hr capsule Take 1 capsule (120 mg total) by mouth daily. (Patient not taking: Reported on 11/07/2024) 90  capsule 1 Not Taking   diltiazem  (CARDIZEM  CD) 180 MG 24 hr capsule Take 180 mg by mouth daily.   Unknown   levalbuterol  (XOPENEX ) 0.63 MG/3ML nebulizer solution Take 3 mLs (0.63 mg total) by nebulization every 4 (four) hours as needed for wheezing or shortness of breath. (Patient not taking: Reported on 11/07/2024) 75 mL 12 Not Taking   lisinopril  (ZESTRIL ) 2.5 MG tablet Take 1 tablet (2.5 mg total) by mouth daily. (Patient not taking: Reported on 11/07/2024) 90 tablet 1 Not Taking   ondansetron  (ZOFRAN ) 4 MG tablet Take 1 tablet (4 mg total) by mouth every 8 (eight) hours as needed for nausea or vomiting. (Patient not taking: Reported on 11/07/2024) 30 tablet 0 Not Taking   potassium chloride  (KLOR-CON ) 10 MEQ tablet Take 10 mEq by mouth daily. (Patient not taking: Reported on 11/07/2024)   Not Taking   spironolactone  (ALDACTONE ) 25 MG tablet Take 1 tablet (25 mg total) by mouth daily. (Patient not taking: Reported on 11/07/2024) 90 tablet 3 Not Taking   Scheduled:   amiodarone   200 mg Oral Daily   atorvastatin   80 mg Oral Daily   budesonide -glycopyrrolate -formoterol   2 puff Inhalation BID   dapagliflozin  propanediol  5 mg Oral Daily   diltiazem   240 mg Oral Daily   furosemide   60 mg Intravenous BID   gabapentin   100 mg Oral TID   insulin  aspart  0-5 Units Subcutaneous QHS   insulin  aspart  0-6 Units Subcutaneous TID WC   pantoprazole   40 mg Oral Daily   potassium chloride   40 mEq Oral BID   sodium chloride  flush  3 mL Intravenous Q12H   spironolactone   25 mg Oral Daily   tamsulosin   0.4 mg Oral QHS   Infusions:   ceFEPime  (MAXIPIME ) IV 2 g (11/07/24 2013)   heparin  1,400 Units/hr (11/07/24 2000)   vancomycin  1,250 mg (11/07/24 2203)   PRN: acetaminophen  **OR** acetaminophen , levalbuterol , polyethylene glycol  Assessment: 3 yoF with a history of diastolic CHF, COPD, CAD/CABG, DM2, and hx of PE on Eliquis . Patient is presenting with worsening shortness of breath. Heparin  per pharmacy consult  placed for VTE treatment in preparation for thoracentesis.  Patient was on Eliquis  prior to arrival. Last dose was on 01/03 at 2132. Utilizing aPTT monitoring due to likely falsely high anti-Xa level secondary to DOAC use.  aPTT 157, confirmed drawn on opposite arm of heparin  infusion. No s/sx of bleeding per RN, HgB 8.8 and PLTS 349.    Goal of Therapy:  Heparin  level 0.3-0.7 units/ml aPTT 66-102 seconds Monitor platelets  by anticoagulation protocol: Yes   Plan:  Hold heparin  x 1 hour.  Resume heparin  at 1250u/hr (16u//kg from 18u/kg) @ 00:00.  Check aPTT in 8 hours. Will not get heparin  level given DOAC given on 1/3 - unlikely to correlate.  Monitor for s/sx of bleeding.  Continue to monitor H&H and platelets  Powell Blush, PharmD, [facility] 11/07/2024 10:38 PM           [1]  Allergies Allergen Reactions   Iodine Swelling    Neck, gland swelling   Iohexol  Swelling    Neck, gland swelling   Iodinated Contrast Media    Metformin  And Related Diarrhea   Metformin  Nausea Only

## 2024-11-07 NOTE — Evaluation (Signed)
 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 Rockey [place].    Peripheral vascular disease    Abdominal Aortic Aneursym   Pneumonia    as a child   Radiation 09/18/15-10/25/15   left lower lobe 70.2 Gy   Seizures (HCC) 02/18/2020   Tobacco abuse    Past Surgical History:  Past Surgical History:  Procedure Laterality Date   CHOLECYSTECTOMY N/A 07/23/2017   Procedure: LAPAROSCOPIC CHOLECYSTECTOMY;  Surgeon: Kinsinger, Herlene Righter, MD;  Location: [facility];  Service: General;  Laterality: N/A;   CLIPPING OF ATRIAL APPENDAGE Left 05/08/2021   Procedure: CLIPPING OF ATRIAL APPENDAGE USING 45 ATRICLIP;  Surgeon: German Bartlett PEDLAR, MD;  Location: [facility];  Service: Open Heart Surgery;  Laterality: Left;   COLONOSCOPY     CORONARY  ARTERY BYPASS GRAFT N/A 05/08/2021   Procedure: CORONARY ARTERY BYPASS GRAFTING (CABG)X 3 USING LEFT INTERNAL MAMMARY ARTERY AND RIGHT GREATER SAPEHNOUS VEIN;  Surgeon: German Bartlett PEDLAR, MD;  Location: [facility];  Service: Open Heart Surgery;  Laterality: N/A;   ENDOVEIN HARVEST OF GREATER SAPHENOUS VEIN Right 05/08/2021   Procedure: ENDOVEIN HARVEST OF GREATER SAPHENOUS VEIN;  Surgeon: German Bartlett PEDLAR, MD;  Location: [facility];  Service: Open Heart Surgery;  Laterality: Right;   EYE SURGERY Bilateral    Cataracts removed w/ lens implant   HERNIA REPAIR     Left 36 years ago . Right inguinal hernia repair 10-01-17 Dr. Stevie   INGUINAL HERNIA REPAIR Right 10/01/2017   Procedure: RIGHT INGUINAL HERNIA REPAIR WITH MESH;  Surgeon: Kinsinger, Herlene Righter, MD;  Location: [facility];  Service: General;  Laterality: Right;  TAP BLOCK   INSERTION OF MESH Right 10/01/2017   Procedure: INSERTION OF MESH;  Surgeon: Stevie Herlene Righter, MD;  Location: [facility];  Service: General;  Laterality: Right;   IR THORACENTESIS ASP PLEURAL SPACE W/IMG GUIDE  05/18/2021   IR THORACENTESIS ASP PLEURAL SPACE W/IMG GUIDE  06/07/2021   LEFT HEART CATH AND CORONARY ANGIOGRAPHY N/A 04/20/2021   Procedure: LEFT HEART CATH AND CORONARY ANGIOGRAPHY;  Surgeon: Darron Deatrice LABOR, MD;  [facility]: [facility];  Service: Cardiovascular;  Laterality: N/A;   TEE WITHOUT CARDIOVERSION N/A 05/08/2021  Procedure: TRANSESOPHAGEAL ECHOCARDIOGRAM (TEE);  Surgeon: German Bartlett PEDLAR, MD;  Location: [facility];  Service: Open Heart Surgery;  Laterality: N/A;   TONSILLECTOMY     TONSILLECTOMY     VIDEO BRONCHOSCOPY Bilateral 07/26/2015   Procedure: VIDEO BRONCHOSCOPY WITH FLUORO;  Surgeon: Ozell KATHEE America, MD;  Location: [facility];  Service: Cardiopulmonary;  Laterality: Bilateral;   VIDEO BRONCHOSCOPY WITH ENDOBRONCHIAL NAVIGATION N/A 08/23/2015   Procedure: VIDEO BRONCHOSCOPY WITH ENDOBRONCHIAL NAVIGATION;  Surgeon: Dallas KATHEE Jude, MD;  Location: [facility];  Service: Thoracic;  Laterality: N/A;   VIDEO BRONCHOSCOPY WITH ENDOBRONCHIAL ULTRASOUND N/A 08/23/2015   Procedure: VIDEO BRONCHOSCOPY WITH ENDOBRONCHIAL ULTRASOUND;  Surgeon: Dallas KATHEE Jude, MD;  Location: [facility];  Service: Thoracic;  Laterality: N/A;   HPI:  Pt is an 84 y.o. male who presented 1/2 with worsening shortness of breath. CT chest 1/2: Airspace disease throughout the left upper lobe concerning for pneumonia. Nodular airspace disease in the lingula and left lower lobe, possibly infectious/inflammatory, although neoplasm cannot be excluded. Moderate left pleural effusion, increasing since prior CT. Small scattered clustered ground-glass nodules in the right upper lobe.  PMH:  CHF, COPD, non-small cell lung cancer s/p XRT, currently under observation, CAD/CABG, type 2 diabetes mellitus, seizure disorder, neuropathy, anxiety, recurrent pleural effusion, BPH, prior chronic pain, recent long hospitalization at [facility] from 12/15-1/1 for CHF exacerbation A-fib RVR, pleural effusion and concern for PE.    Assessment / Plan / Recommendation  Clinical Impression  Pt was seen for bedside swallow evaluation. He stated that once today he coughed when reclined and drinking coffee, but that this does not occur frequently. Oral mechanism exam was [facility] and he was edentulous. He tolerated all solids and liquids without signs or symptoms of oropharyngeal dysphagia despite being challenged with large boluses and consecutive swallows. A regular texture diet with thin liquids is recommended at this time and further skilled SLP services are not clinically indicated for swallowing. SLP Visit Diagnosis: Dysphagia, unspecified (R13.10)    Aspiration Risk  Mild aspiration risk    Diet Recommendation           Other Recommendations Oral Care Recommendations: Oral care BID     Swallow Evaluation Recommendations Recommendations: PO diet PO Diet Recommendation: Regular;Thin liquids (Level  0) Liquid Administration via: Cup;Straw Medication Administration: Whole meds with liquid (or whole with puree; pt's preference pending size) Supervision: Intermittent supervision/cueing for swallowing strategies Postural changes: Position pt fully upright for meals Oral care recommendations: Oral care BID (2x/day)   Assistance Recommended at Discharge    Functional Status Assessment    Frequency and Duration            Prognosis        Swallow Study   General Date of Onset: 11/06/24 HPI: Pt is an 84 y.o. male who presented 1/2 with worsening shortness of breath. CT chest 1/2: Airspace disease throughout the left upper lobe concerning for pneumonia. Nodular airspace disease in the lingula and left lower lobe, possibly infectious/inflammatory, although neoplasm cannot be excluded. Moderate left pleural effusion, increasing since prior CT. Small scattered clustered ground-glass nodules in the right upper lobe.  PMH:  CHF, COPD, non-small cell lung cancer s/p XRT, currently under observation, CAD/CABG, type 2 diabetes mellitus, seizure disorder, neuropathy, anxiety, recurrent pleural effusion, BPH, prior chronic pain, recent long hospitalization at [facility] from 12/15-1/1 for CHF exacerbation A-fib RVR, pleural effusion and concern for PE. Type of Study: Bedside Swallow Evaluation Previous Swallow Assessment: none  Diet Prior to this Study: Regular;Thin liquids (Level 0) Temperature Spikes Noted: No Respiratory Status: Nasal cannula History of Recent Intubation: No Behavior/Cognition: Alert;Cooperative;Pleasant mood Oral Cavity Assessment: Within Functional Limits Oral Care Completed by SLP: No Oral Cavity - Dentition: Edentulous Vision: Functional for self-feeding Self-Feeding Abilities: Able to feed self Patient Positioning: Partially reclined Baseline Vocal Quality: Hoarse Volitional Cough: Strong Volitional Swallow: Able to elicit    Oral/Motor/Sensory Function  Overall Oral Motor/Sensory Function: Within functional limits   Ice Chips     Thin Liquid Thin Liquid: Within functional limits Presentation: Straw    Nectar Thick Nectar Thick Liquid: Not tested   Honey Thick Honey Thick Liquid: Not tested   Puree Puree: Within functional limits Presentation: Spoon   Solid     Solid: Within functional limits Presentation: Self Fed     Robert Harmsen I. [place], MS, CCC-SLP Acute Rehabilitation Services Office number [phone]  Thea LILLETTE Bates 11/07/2024,2:43 PM

## 2024-11-07 NOTE — Progress Notes (Addendum)
 ANTICOAGULATION CONSULT NOTE  Pharmacy Consult for Heparin  Indication: VTE treatment  Allergies[1]  Patient Measurements: Weight: 75.6 kg (166 lb 10.7 oz) Heparin  Dosing Weight: 75.6  Vital Signs: Temp: 97 F (36.1 C) (01/04 0728) Temp Source: Oral (01/04 0728) BP: 103/67 (01/04 0747) Pulse Rate: 84 (01/04 0728)  Labs: Recent Labs    11/05/24 1702 11/06/24 1455 11/07/24 0158  HGB 11.0* 10.1* 8.8*  HCT 36.0* 32.9* 28.5*  PLT 496* 386 349  CREATININE 0.85 0.63 0.71    Estimated Creatinine Clearance: 70 mL/min (by C-G formula based on SCr of 0.71 mg/dL).   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 Rockey [place].    Peripheral vascular disease    Abdominal Aortic Aneursym   Pneumonia    as a child   Radiation 09/18/15-10/25/15   left lower lobe 70.2 Gy   Seizures (HCC) 02/18/2020   Tobacco abuse     Medications:  Medications Prior to Admission  Medication Sig Dispense Refill Last Dose/Taking   acetaminophen  (TYLENOL ) 500 MG tablet Take 1-2 tablets  (500-1,000 mg total) by mouth every 6 (six) hours as needed. 30 tablet 0 11/05/2024   amiodarone  (PACERONE ) 200 MG tablet Take 1 tablet (200 mg total) by mouth daily. 90 tablet 3 Unknown   atorvastatin  (LIPITOR ) 80 MG tablet Take 1 tablet (80 mg total) by mouth daily. 90 tablet 1 Unknown   furosemide  (LASIX ) 20 MG tablet Take 20 mg by mouth daily.   Unknown   gabapentin  (NEURONTIN ) 100 MG capsule Take 1 capsule (100 mg total) by mouth 3 (three) times daily. 90 capsule 3 Unknown   omeprazole  (PRILOSEC) 40 MG capsule Take 1 capsule (40 mg total) by mouth daily. 90 capsule 1 Unknown   polyethylene glycol (MIRALAX  / GLYCOLAX ) 17 g packet Take 17 g by mouth 2 (two) times daily.   Unknown   sertraline  (ZOLOFT ) 50 MG tablet Take 1 tablet (50 mg total) by mouth daily. 30 tablet 3 Unknown   tamsulosin  (FLOMAX ) 0.4 MG CAPS capsule Take 1 capsule (0.4 mg total) by mouth at bedtime. 90 capsule 1 Unknown   apixaban  (ELIQUIS ) 5 MG TABS tablet Take 2 tablets (10 mg total) by mouth 2 (two) times a day for 5 days, THEN 1 tablet (5 mg total) 2 (two) times a day.   Unknown   budesonide -glycopyrrolate -formoterol  (BREZTRI  AEROSPHERE) 160-9-4.8 MCG/ACT AERO inhaler Inhale 2 puffs into the lungs 2 (two) times daily. (Patient not taking: Reported on  11/07/2024)   Not Taking   Cyanocobalamin (VITAMIN B-12 CR PO) Take 1 tablet by mouth daily. (Patient not taking: Reported on 11/07/2024)   Not Taking   dapagliflozin  propanediol (FARXIGA ) 5 MG TABS tablet Take 1 tablet (5 mg total) by mouth daily. (Patient not taking: Reported on 11/07/2024) 30 tablet 1 Not Taking   diclofenac  Sodium (VOLTAREN ) 1 % GEL Apply 4 g topically 4 (four) times daily. 100 g 0 Unknown   diltiazem  (CARDIZEM  CD) 120 MG 24 hr capsule Take 1 capsule (120 mg total) by mouth daily. (Patient not taking: Reported on 11/07/2024) 90 capsule 1 Not Taking   diltiazem  (CARDIZEM  CD) 180 MG 24 hr capsule Take 180 mg by mouth daily.   Unknown   levalbuterol  (XOPENEX ) 0.63 MG/3ML  nebulizer solution Take 3 mLs (0.63 mg total) by nebulization every 4 (four) hours as needed for wheezing or shortness of breath. (Patient not taking: Reported on 11/07/2024) 75 mL 12 Not Taking   lisinopril  (ZESTRIL ) 2.5 MG tablet Take 1 tablet (2.5 mg total) by mouth daily. (Patient not taking: Reported on 11/07/2024) 90 tablet 1 Not Taking   ondansetron  (ZOFRAN ) 4 MG tablet Take 1 tablet (4 mg total) by mouth every 8 (eight) hours as needed for nausea or vomiting. (Patient not taking: Reported on 11/07/2024) 30 tablet 0 Not Taking   potassium chloride  (KLOR-CON ) 10 MEQ tablet Take 10 mEq by mouth daily. (Patient not taking: Reported on 11/07/2024)   Not Taking   spironolactone  (ALDACTONE ) 25 MG tablet Take 1 tablet (25 mg total) by mouth daily. (Patient not taking: Reported on 11/07/2024) 90 tablet 3 Not Taking   Scheduled:   amiodarone   200 mg Oral Daily   atorvastatin   80 mg Oral Daily   budesonide -glycopyrrolate -formoterol   2 puff Inhalation BID   dapagliflozin  propanediol  5 mg Oral Daily   diltiazem   240 mg Oral Daily   furosemide   60 mg Intravenous BID   gabapentin   100 mg Oral TID   insulin  aspart  0-5 Units Subcutaneous QHS   insulin  aspart  0-6 Units Subcutaneous TID WC   pantoprazole   40 mg Oral Daily   sodium chloride  flush  3 mL Intravenous Q12H   spironolactone   25 mg Oral Daily   tamsulosin   0.4 mg Oral QHS   Infusions:   ceFEPime  (MAXIPIME ) IV 2 g (11/07/24 0442)   vancomycin  Stopped (11/07/24 0035)   PRN: acetaminophen  **OR** acetaminophen , levalbuterol , polyethylene glycol  Assessment: 84 yoF with a history of diastolic CHF, COPD, CAD/CABG, DM2, and hx of PE on Eliquis . Patient is presenting with worsening shortness of breath. Heparin  per pharmacy consult placed for VTE treatment in preparation for thoracentesis.  Patient was on Eliquis  prior to arrival. Last dose was on 01/03 at 2132. Utilizing aPTT monitoring due to likely falsely high anti-Xa level secondary to DOAC  use.   Hgb 8.8; plt 349 Baseline Heparin  Level >1.1, aPTT 36  Goal of Therapy:  Heparin  level 0.3-0.7 units/ml aPTT 66-102 seconds Monitor platelets by anticoagulation protocol: Yes   Plan:  Start heparin  infusion 1400 units/hr Check baseline aPTT and anti-Xa Check aPTT & anti-Xa level in 8 hours   Daily aPTT and heparin  level while on heparin  Continue to monitor via aPTT until levels are correlated Continue to monitor H&H and platelets  R. Samual Satterfield, PharmD PGY-1 Acute Care Pharmacy Resident [facility] 11/07/2024 10:12 AM          [1]  Allergies Allergen Reactions   Iodine  Swelling    Neck, gland swelling   Iohexol  Swelling    Neck, gland swelling   Iodinated Contrast Media    Metformin  And Related Diarrhea   Metformin  Nausea Only

## 2024-11-08 ENCOUNTER — Inpatient Hospital Stay (HOSPITAL_COMMUNITY)

## 2024-11-08 ENCOUNTER — Telehealth: Payer: Self-pay | Admitting: Family

## 2024-11-08 ENCOUNTER — Other Ambulatory Visit

## 2024-11-08 DIAGNOSIS — J9601 Acute respiratory failure with hypoxia: Secondary | ICD-10-CM | POA: Diagnosis not present

## 2024-11-08 DIAGNOSIS — D329 Benign neoplasm of meninges, unspecified: Secondary | ICD-10-CM | POA: Diagnosis not present

## 2024-11-08 DIAGNOSIS — R4781 Slurred speech: Secondary | ICD-10-CM | POA: Diagnosis not present

## 2024-11-08 LAB — CBC
HCT: 29 % — ABNORMAL LOW (ref 39.0–52.0)
Hemoglobin: 8.9 g/dL — ABNORMAL LOW (ref 13.0–17.0)
MCH: 25.7 pg — ABNORMAL LOW (ref 26.0–34.0)
MCHC: 30.7 g/dL (ref 30.0–36.0)
MCV: 83.8 fL (ref 80.0–100.0)
Platelets: 322 K/uL (ref 150–400)
RBC: 3.46 MIL/uL — ABNORMAL LOW (ref 4.22–5.81)
RDW: 18.1 % — ABNORMAL HIGH (ref 11.5–15.5)
WBC: 10.6 K/uL — ABNORMAL HIGH (ref 4.0–10.5)
nRBC: 0 % (ref 0.0–0.2)

## 2024-11-08 LAB — GLUCOSE, CAPILLARY
Glucose-Capillary: 127 mg/dL — ABNORMAL HIGH (ref 70–99)
Glucose-Capillary: 125 mg/dL — ABNORMAL HIGH (ref 70–99)
Glucose-Capillary: 154 mg/dL — ABNORMAL HIGH (ref 70–99)
Glucose-Capillary: 224 mg/dL — ABNORMAL HIGH (ref 70–99)
Glucose-Capillary: 287 mg/dL — ABNORMAL HIGH (ref 70–99)

## 2024-11-08 LAB — APTT
aPTT: 74 s — ABNORMAL HIGH (ref 24–36)
aPTT: 98 s — ABNORMAL HIGH (ref 24–36)

## 2024-11-08 LAB — COMPREHENSIVE METABOLIC PANEL WITH GFR
ALT: 15 U/L (ref 0–44)
AST: 12 U/L — ABNORMAL LOW (ref 15–41)
Albumin: 2.6 g/dL — ABNORMAL LOW (ref 3.5–5.0)
Alkaline Phosphatase: 70 U/L (ref 38–126)
Anion gap: 8 (ref 5–15)
BUN: 19 mg/dL (ref 8–23)
CO2: 28 mmol/L (ref 22–32)
Calcium: 8.1 mg/dL — ABNORMAL LOW (ref 8.9–10.3)
Chloride: 99 mmol/L (ref 98–111)
Creatinine, Ser: 0.67 mg/dL (ref 0.61–1.24)
GFR, Estimated: >60 mL/min
Glucose, Bld: 115 mg/dL — ABNORMAL HIGH (ref 70–99)
Potassium: 3.6 mmol/L (ref 3.5–5.1)
Sodium: 135 mmol/L (ref 135–145)
Total Bilirubin: 0.5 mg/dL (ref 0.0–1.2)
Total Protein: 5 g/dL — ABNORMAL LOW (ref 6.5–8.1)

## 2024-11-08 MED ORDER — LIDOCAINE VISCOUS HCL 2 % MT SOLN
15.0000 mL | OROMUCOSAL | Status: AC | PRN
  Administered 2024-11-09 (×2): 15 mL via OROMUCOSAL
  Filled 2024-11-08 (×3): qty 15

## 2024-11-08 MED ORDER — METHYLPREDNISOLONE SODIUM SUCC 40 MG IJ SOLR
40.0000 mg | Freq: Once | INTRAMUSCULAR | Status: AC
  Administered 2024-11-08: 40 mg via INTRAVENOUS
  Filled 2024-11-08: qty 1

## 2024-11-08 NOTE — Progress Notes (Signed)
 0140 Patient called nurse station complaining that his tongue felt weird and he cannot swallow. Per Vertell Naomi PEAK, patient's speech sounded garbled. Charge and primary RN immediately responded and went to bedside. Once at beside, NIH performed to rule out signs of stroke given a slight appearance of left sided facial droop. Patient did not have in his teeth at the time of assessment. Patient uncooperative with assessment. Rapid response RN, Alm, called to bedside. Vitals and blood sugar stable.  0145 Heparin  paused. RRT at bedside along with provider, Dr. Vanessa. Both gentlemen attempted to perform NIH, however, patient refusing to comply. Patient taken down to CT and MRI to rule out stroke.

## 2024-11-08 NOTE — Plan of Care (Signed)
 Patient ID: Jovahn Breit, male   DOB: 05-07-1941, 84 y.o.   MRN: [id number]  Problem: Education: Goal: Ability to describe self-care measures that may prevent or decrease complications (Diabetes Survival Skills Education) will improve Outcome: Progressing Goal: Individualized Educational Video(s) Outcome: Progressing   Problem: Coping: Goal: Ability to adjust to condition or change in health will improve Outcome: Progressing   Problem: Fluid Volume: Goal: Ability to maintain a balanced intake and output will improve Outcome: Progressing   Problem: Health Behavior/Discharge Planning: Goal: Ability to identify and utilize available resources and services will improve Outcome: Progressing Goal: Ability to manage health-related needs will improve Outcome: Progressing   Problem: Metabolic: Goal: Ability to maintain appropriate glucose levels will improve Outcome: Progressing   Problem: Nutritional: Goal: Maintenance of adequate nutrition will improve Outcome: Progressing Goal: Progress toward achieving an optimal weight will improve Outcome: Progressing   Problem: Skin Integrity: Goal: Risk for impaired skin integrity will decrease Outcome: Progressing   Problem: Tissue Perfusion: Goal: Adequacy of tissue perfusion will improve Outcome: Progressing   Problem: Education: Goal: Knowledge of General Education information will improve Description: Including pain rating scale, medication(s)/side effects and non-pharmacologic comfort measures Outcome: Progressing   Problem: Health Behavior/Discharge Planning: Goal: Ability to manage health-related needs will improve Outcome: Progressing   Problem: Clinical Measurements: Goal: Ability to maintain clinical measurements within normal limits will improve Outcome: Progressing Goal: Will remain free from infection Outcome: Progressing Goal: Diagnostic test results will improve Outcome: Progressing Goal: Respiratory complications  will improve Outcome: Progressing Goal: Cardiovascular complication will be avoided Outcome: Progressing   Problem: Activity: Goal: Risk for activity intolerance will decrease Outcome: Progressing   Problem: Nutrition: Goal: Adequate nutrition will be maintained Outcome: Progressing   Problem: Coping: Goal: Level of anxiety will decrease Outcome: Progressing   Problem: Elimination: Goal: Will not experience complications related to bowel motility Outcome: Progressing Goal: Will not experience complications related to urinary retention Outcome: Progressing   Problem: Pain Managment: Goal: General experience of comfort will improve and/or be controlled Outcome: Progressing   Problem: Safety: Goal: Ability to remain free from injury will improve Outcome: Progressing   Problem: Skin Integrity: Goal: Risk for impaired skin integrity will decrease Outcome: Progressing   Problem: Education: Goal: Ability to demonstrate management of disease process will improve Outcome: Progressing Goal: Ability to verbalize understanding of medication therapies will improve Outcome: Progressing Goal: Individualized Educational Video(s) Outcome: Progressing   Problem: Activity: Goal: Capacity to carry out activities will improve Outcome: Progressing   Problem: Cardiac: Goal: Ability to achieve and maintain adequate cardiopulmonary perfusion will improve Outcome: Progressing    Verdie JONETTA Collier, RN

## 2024-11-08 NOTE — Telephone Encounter (Signed)
 Spoke to patient, he reports he is being treated for pneumonia and PE. He is at [facility] hospital

## 2024-11-08 NOTE — Progress Notes (Signed)
 Admission completed by this nurse. He was unable to answer all questions, therefore his sister, Reena Sabal, was contacted by this nurse and she answered all questions. The son, Camellia, is also on the way here from [place] , and will be here at approximately 2 PM. Phone was charging at nurses station and a label was placed on it.

## 2024-11-08 NOTE — Progress Notes (Signed)
 ANTICOAGULATION CONSULT NOTE  Pharmacy Consult for Heparin  Indication: VTE treatment  Allergies[1]  Patient Measurements: Height: 5' 7 (170.2 cm) Weight: 69.9 kg (154 lb 1.6 oz) IBW/kg (Calculated) : 66.1 Heparin  Dosing Weight: 75.6  Vital Signs: Temp: 98.1 F (36.7 C) (01/05 1641) Temp Source: Oral (01/05 1641) BP: 98/73 (01/05 1641) Pulse Rate: 86 (01/05 1641)  Labs: Recent Labs    11/06/24 1455 11/07/24 0158 11/07/24 1217 11/07/24 1217 11/07/24 2144 11/08/24 0847 11/08/24 1158 11/08/24 1743  HGB 10.1* 8.8*  --   --   --  8.9*  --   --   HCT 32.9* 28.5*  --   --   --  29.0*  --   --   PLT 386 349  --   --   --  322  --   --   APTT  --   --  36   < > 157*  --  74* 98*  HEPARINUNFRC  --   --  >1.10*  --   --   --   --   --   CREATININE 0.63 0.71  --   --   --  0.67  --   --    < > = values in this interval not displayed.    Estimated Creatinine Clearance: 65.4 mL/min (by C-G formula based on SCr of 0.67 mg/dL).  Assessment: 58 yoF with a history of diastolic CHF, COPD, CAD/CABG, DM2, and hx of PE on Eliquis . Patient is presenting with worsening shortness of breath. Heparin  per pharmacy consult placed for VTE treatment in preparation for thoracentesis.  Patient was on Eliquis  prior to arrival. Last dose was on 01/03 at 2132. Utilizing aPTT monitoring due to likely falsely high anti-Xa level secondary to DOAC use.   1/5 AM: patient with stroke-like symptoms, heparin  gtt paused ~0145 for STAT CT and MRI negative for stroke okay per neuro and primary to restart heparin . Will start at same rate and f/u levels when at steady state.  PTT therapeutic x2. Cont current rate.   Goal of Therapy:  Heparin  level 0.3-0.7 units/ml aPTT 66-102 seconds Monitor platelets by anticoagulation protocol: Yes   Plan:  Continue heparin  drip at 1250 units/hr   Monitor with aPTTs until correlates with heparin  level Monitor for s/sx of bleeding. Continue to monitor H&H and  platelets  Sergio Batch, PharmD, BCIDP, AAHIVP, CPP Infectious Disease Pharmacist 11/08/2024 7:04 PM         [1]  Allergies Allergen Reactions   Iodine Swelling    Neck, gland swelling   Iohexol  Swelling    Neck, gland swelling   Iodinated Contrast Media    Metformin  And Related Diarrhea   Metformin  Nausea Only

## 2024-11-08 NOTE — Progress Notes (Deleted)
 " Cardiology Office Note:    Date:  11/08/2024   ID:  Robert Bates, DOB 1941/10/22, MRN [id number]  PCP:  Daryl Setter, NP   [facility] Providers Cardiologist:  Aleene Passe, MD (Inactive) Cardiology APP:  Lelon Glendia DASEN, PA-C { Click to update primary MD,subspecialty MD or APP then REFRESH:1}    Referring MD: Daryl Setter, NP   No chief complaint on file. ***  History of Present Illness:    Robert Bates is a 84 y.o. 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  He had a radiation therapy for left lower lobe cancer.  He underwent CABG x 3 05/2021 with LIMA-LAD, SVG-OM, SVG-diagonal.  He also had left atrial appendage clipping.  Postop course complicated by A-fib with RVR and pleural effusion requiring thoracentesis x 2.  His atrial fibrillation has been managed with amiodarone  and anticoagulated with Xarelto .  He has chronic diastolic heart failure with preserved LVEF on last echo and grade 2 DD with moderate LAE, mild RAE, mild MR, mild to moderate TR.  Pericardial effusion without tamponade was also noted.  Since CABG, he has had issues with recurrent pleural effusions since 2022.  He was hospitalized 08/04/2023 with A-fib with RVR in the setting of COPD exacerbation and active wheezing. I saw him 03/2024 for preop risk evaluation prior to back surgery.   He was hospitalized at [facility] regional diagnosed with influenza A, COPD exacerbation.  Admission complicated by CHF and A-fib with RVR further complicated by PE based on VQ scan results.  He had a recurrence of left pleural effusion requiring thoracentesis on 10/29/2024 yielding 1.1 L fluid, cytology negative for malignant cells.  He was discharged on 11/04/2024.  He presented back to [facility] 11/06/2024 with worsening shortness of breath since discharge and persistent edema.  He is currently  unhoused living in a hotel/motel.  He is currently hospitalized.     CAD s/p CABG x 3 05/2021 - Has not had an ischemic evaluation since CABG in 2022 -No aspirin  given need for Xarelto  He is on Cardizem  120 mg for management of his A-fib, continue 50 mg spironolactone    Chronic diastolic heart failure Grade 2 DD on last echo - Maintained on 50 mg spironolactone , 40 mg Lasix  daily   Persistent atrial fibrillation - Maintained on 200 mg amiodarone  daily and 120 mg Cardizem    Chronic anticoagulation - Doing well on 20 mg Xarelto , no bleeding issues   Preoperative risk evaluation for Mace prior to lumbar decompression Per the RCRI, he has a 6.6% risk of Mace for any procedure.  He has a recent PE based on VQ scan.      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 Rockey [place].    Peripheral vascular disease    Abdominal Aortic Aneursym   Pneumonia    as a child   Radiation 09/18/15-10/25/15   left lower lobe 70.2 Gy   Seizures (HCC) 02/18/2020   Tobacco abuse     Past Surgical  History:  Procedure Laterality Date   CHOLECYSTECTOMY N/A 07/23/2017   Procedure: LAPAROSCOPIC CHOLECYSTECTOMY;  Surgeon: Stevie Herlene Righter, MD;  Location: [facility];  Service: General;  Laterality: N/A;   CLIPPING OF ATRIAL APPENDAGE Left 05/08/2021   Procedure: CLIPPING OF ATRIAL APPENDAGE USING 45 ATRICLIP;  Surgeon: German Bartlett PEDLAR, MD;  Location: [facility] OR;  Service: Open Heart Surgery;  Laterality: Left;   COLONOSCOPY     CORONARY ARTERY BYPASS GRAFT N/A 05/08/2021   Procedure: CORONARY ARTERY BYPASS GRAFTING (CABG)X 3 USING LEFT INTERNAL MAMMARY ARTERY AND RIGHT GREATER SAPEHNOUS VEIN;  Surgeon: German Bartlett PEDLAR, MD;  Location: [facility] OR;  Service: Open Heart Surgery;  Laterality: N/A;   ENDOVEIN HARVEST OF GREATER SAPHENOUS VEIN Right 05/08/2021   Procedure: ENDOVEIN HARVEST OF GREATER SAPHENOUS VEIN;  Surgeon: German Bartlett PEDLAR, MD;  Location: [facility] OR;  Service: Open Heart Surgery;  Laterality: Right;   EYE SURGERY Bilateral    Cataracts removed w/ lens implant   HERNIA REPAIR     Left 36 years ago . Right inguinal hernia repair 10-01-17 Dr. Stevie   INGUINAL HERNIA REPAIR Right 10/01/2017   Procedure: RIGHT INGUINAL HERNIA REPAIR WITH MESH;  Surgeon: Kinsinger, Herlene Righter, MD;  Location: [facility];  Service: General;  Laterality: Right;  TAP BLOCK   INSERTION OF MESH Right 10/01/2017   Procedure: INSERTION OF MESH;  Surgeon: Stevie Herlene Righter, MD;  Location: [facility];  Service: General;  Laterality: Right;   IR THORACENTESIS ASP PLEURAL SPACE W/IMG GUIDE  05/18/2021   IR THORACENTESIS ASP PLEURAL SPACE W/IMG GUIDE  06/07/2021   LEFT HEART CATH AND CORONARY ANGIOGRAPHY N/A 04/20/2021   Procedure: LEFT HEART CATH AND CORONARY ANGIOGRAPHY;  Surgeon: Darron Deatrice LABOR, MD;  Location: [facility];  Service: Cardiovascular;  Laterality: N/A;   TEE WITHOUT CARDIOVERSION N/A 05/08/2021   Procedure: TRANSESOPHAGEAL ECHOCARDIOGRAM (TEE);  Surgeon: German Bartlett PEDLAR, MD;  [facility];  Service: Open  Heart Surgery;  Laterality: N/A;   TONSILLECTOMY     TONSILLECTOMY     VIDEO BRONCHOSCOPY Bilateral 07/26/2015   Procedure: VIDEO BRONCHOSCOPY WITH FLUORO;  Surgeon: Ozell KATHEE America, MD;  Location: [facility];  Service: Cardiopulmonary;  Laterality: Bilateral;   VIDEO BRONCHOSCOPY WITH ENDOBRONCHIAL NAVIGATION N/A 08/23/2015   Procedure: VIDEO BRONCHOSCOPY WITH ENDOBRONCHIAL NAVIGATION;  Surgeon: Dallas KATHEE Jude, MD;  Location: [facility];  Service: Thoracic;  Laterality: N/A;   VIDEO BRONCHOSCOPY WITH ENDOBRONCHIAL ULTRASOUND N/A 08/23/2015   Procedure: VIDEO BRONCHOSCOPY WITH ENDOBRONCHIAL ULTRASOUND;  Surgeon: Dallas KATHEE Jude, MD;  Location: [facility];  Service: Thoracic;  Laterality: N/A;    Current Medications: Active Medications[1]   Allergies:   Iodine, Iohexol , Iodinated contrast media, Metformin  and related, and Metformin    Social History   Socioeconomic History   Marital status: Widowed    Spouse name: Not on file   Number of  children: 2   Years of education: Not on file   Highest education level: Not on file  Occupational History   Occupation: Retired    Associate Professor: [employer]    Comment: truck Air Traffic Controller: [employer]  Tobacco Use   Smoking status: Former    Current packs/day: 0.00    Average packs/day: 1 pack/day for 57.0 years (57.0 ttl pk-yrs)    Types: Cigarettes, Cigars    Start date: 08/07/1958    Quit date: 08/08/2015    Years since quitting: 9.2   Smokeless tobacco: Former    Types: Chew    Quit date: 11/04/1958   Tobacco comments:    Will smoke cigar every once in awhile.  Vaping daily started a couple weeks ago.  04/18/22 hfb    Patient states he is still vaping. AB, CMA 09-10-2023  Vaping Use   Vaping status: Some Days  Substance and Sexual Activity   Alcohol use: Not Currently    Alcohol/week: 0.0 standard drinks of alcohol   Drug use: No   Sexual activity: Not Currently  Other Topics Concern   Not on file  Social History Narrative   Not on  file   Social Drivers of Health   Tobacco Use: Medium Risk (11/05/2024)   Patient History    Smoking Tobacco Use: Former    Smokeless Tobacco Use: Former    Passive Exposure: Not on Actuary Strain: Low Risk (10/19/2024)   Received from [facility]   Overall Financial Resource Strain (CARDIA)    How hard is it for you to pay for the very basics like food, housing, medical care, and heating?: Not hard at all  Food Insecurity: No Food Insecurity (11/08/2024)   Epic    Worried About Radiation Protection Practitioner of Food in the Last Year: Never true    Ran Out of Food in the Last Year: Never true  Transportation Needs: No Transportation Needs (11/08/2024)   Epic    Lack of Transportation (Medical): No    Lack of Transportation (Non-Medical): No  Physical Activity: Inactive (09/28/2024)   Exercise Vital Sign    Days of Exercise per Week: 0 days    Minutes of Exercise per Session: 0 min  Stress: Stress Concern Present (09/28/2024)   [employer] of Occupational Health - Occupational Stress Questionnaire    Feeling of Stress: To some extent  Social Connections: Socially Isolated (11/08/2024)   Social Connection and Isolation Panel    Frequency of Communication with Friends and Family: More than three times a week    Frequency of Social Gatherings with Friends and Family: More than three times a week    Attends Religious Services: Never    Database Administrator or Organizations: No    Attends Banker Meetings: Never    Marital Status: Widowed  Depression (PHQ2-9): Medium Risk (09/28/2024)   Depression (PHQ2-9)    PHQ-2 Score: 7  Alcohol Screen: Low Risk (10/02/2022)   Alcohol Screen    Last Alcohol Screening Score (AUDIT): 0  Housing: Low Risk (11/08/2024)   Epic    Unable to Pay for Housing in the Last Year: No    Number of Times Moved in the Last Year: 0    Homeless in the Last Year: No  Recent Concern: Housing - High Risk (09/28/2024)   Epic    Unable to Pay for  Housing in the Last Year: Yes    Number of Times Moved in the  Last Year: 5    Homeless in the Last Year: No  Utilities: Not At Risk (11/08/2024)   Epic    Threatened with loss of utilities: No  Health Literacy: Not on file     Family History: The patient's ***family history includes Atrial fibrillation in his son; Emphysema in his father; Learning disabilities in his son; Leukemia in his father and another family member; Stroke in an other family member.  ROS:   Please see the history of present illness.    *** All other systems reviewed and are negative.  EKGs/Labs/Other Studies Reviewed:    The following studies were reviewed today: ***      Recent Labs: 11/05/2024: Magnesium  2.0; Pro Brain Natriuretic Peptide 981.0 11/08/2024: ALT 15; BUN 19; Creatinine, Ser 0.67; Hemoglobin 8.9; Platelets 322; Potassium 3.6; Sodium 135  Recent Lipid Panel    Component Value Date/Time   CHOL 142 08/07/2023 0404   CHOL 127 10/07/2022 1128   TRIG 81 08/07/2023 0404   HDL 79 08/07/2023 0404   HDL 64 10/07/2022 1128   CHOLHDL 1.8 08/07/2023 0404   VLDL 16 08/07/2023 0404   LDLCALC 47 08/07/2023 0404   LDLCALC 46 10/07/2022 1128   LDLCALC 62 04/11/2021 1421   LDLDIRECT 142.2 12/26/2008 1032     Risk Assessment/Calculations:   {Does this patient have ATRIAL FIBRILLATION?:[phone]}            Physical Exam:    VS:  There were no vitals taken for this visit.    Wt Readings from Last 3 Encounters:  11/08/24 154 lb 1.6 oz (69.9 kg)  09/28/24 167 lb (75.8 kg)  08/20/24 167 lb (75.8 kg)     GEN: *** Well nourished, well developed in no acute distress HEENT: Normal NECK: No JVD; No carotid bruits LYMPHATICS: No lymphadenopathy CARDIAC: ***RRR, no murmurs, rubs, gallops RESPIRATORY:  Clear to auscultation without rales, wheezing or rhonchi  ABDOMEN: Soft, non-tender, non-distended MUSCULOSKELETAL:  No edema; No deformity  SKIN: Warm and dry NEUROLOGIC:  Alert and oriented x  3 PSYCHIATRIC:  Normal affect   ASSESSMENT:    No diagnosis found. PLAN:    In order of problems listed above:  ***      {Are you ordering a CV Procedure (e.g. stress test, cath, DCCV, TEE, etc)?   Press F2        :[id number]}    Medication Adjustments/Labs and Tests Ordered: Current medicines are reviewed at length with the patient today.  Concerns regarding medicines are outlined above.  No orders of the defined types were placed in this encounter.  No orders of the defined types were placed in this encounter.   There are no Patient Instructions on file for this visit.   Signed, Jon Nat Hails, PA  11/08/2024 4:44 PM    [facility]    [1]  No outpatient medications have been marked as taking for the 11/09/24 encounter (Appointment) with Hails Jon Nat, PA.   "

## 2024-11-08 NOTE — Progress Notes (Signed)
 Brief Neuro Note:  I was asked to evaluate [name] for noted slurred speech and a L facial droop. Briefly, [name] is a 84 y.o. 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  RN was worried that roughly 0015 was the LKW and then later, he was noted to have slurred speech and L facial droop. On my evaluation, patient reports that his throat hurts and that he has rouble swallowing.  HE is seen to be moving his arms spontaneously nd antigravity but will not move for me on command. This is very volitional. I saw him fixing his bed sheets with his hands, wiping his nose with his hands and scratching his leg. His speech maybe slurred but I do not see an obvious facial droop, his face move very symmetrically and this seems like a facial fold.  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  No further Neuro workup. We will signoff.  Plan discussed with Dr. Sundil with the overnight Hospitalist team.  Raylen Ken [facility]

## 2024-11-08 NOTE — Progress Notes (Signed)
 ANTICOAGULATION CONSULT NOTE  Pharmacy Consult for Heparin  Indication: VTE treatment  Allergies[1]  Patient Measurements: Weight: 69.9 kg (154 lb 1.6 oz) Heparin  Dosing Weight: 75.6  Vital Signs: Temp: 98 F (36.7 C) (01/05 1122) Temp Source: Oral (01/05 1122) BP: 95/65 (01/05 1122) Pulse Rate: 94 (01/05 1122)  Labs: Recent Labs    11/06/24 1455 11/07/24 0158 11/07/24 1217 11/07/24 2144 11/08/24 0847 11/08/24 1158  HGB 10.1* 8.8*  --   --  8.9*  --   HCT 32.9* 28.5*  --   --  29.0*  --   PLT 386 349  --   --  322  --   APTT  --   --  36 157*  --  74*  HEPARINUNFRC  --   --  >1.10*  --   --   --   CREATININE 0.63 0.71  --   --  0.67  --     Estimated Creatinine Clearance: 69.2 mL/min (by C-G formula based on SCr of 0.67 mg/dL).  Assessment: 68 yoF with a history of diastolic CHF, COPD, CAD/CABG, DM2, and hx of PE on Eliquis . Patient is presenting with worsening shortness of breath. Heparin  per pharmacy consult placed for VTE treatment in preparation for thoracentesis.  Patient was on Eliquis  prior to arrival. Last dose was on 01/03 at 2132. Utilizing aPTT monitoring due to likely falsely high anti-Xa level secondary to DOAC use.   1/5 AM: patient with stroke-like symptoms, heparin  gtt paused ~0145 for STAT CT and MRI negative for stroke okay per neuro and primary to restart heparin . Will start at same rate and f/u levels when at steady state.  aPTT therapeutic at 74 sec on 1250 units/hr. No bleeding noted, Hgb stable 8s, platelets are normal. Patient refused thoracentesis today.   Goal of Therapy:  Heparin  level 0.3-0.7 units/ml aPTT 66-102 seconds Monitor platelets by anticoagulation protocol: Yes   Plan:  Continue heparin  drip at 1250 units/hr   6h confirm aPTT Monitor with aPTTs until correlates with heparin  level Monitor for s/sx of bleeding. Continue to monitor H&H and platelets  Thank you for involving pharmacy in this patient's care.  Delon Sax, PharmD, BCPS Clinical Pharmacist Clinical phone for 11/08/2024 is [phone] 11/08/2024 1:10 PM       [1]  Allergies Allergen Reactions   Iodine Swelling    Neck, gland swelling   Iohexol  Swelling    Neck, gland swelling   Iodinated Contrast Media    Metformin  And Related Diarrhea   Metformin  Nausea Only

## 2024-11-08 NOTE — Progress Notes (Signed)
 ANTICOAGULATION CONSULT NOTE  Pharmacy Consult for Heparin  Indication: VTE treatment  Allergies[1]  Patient Measurements: Weight: 75.6 kg (166 lb 10.7 oz) Heparin  Dosing Weight: 75.6  Vital Signs: Temp: 97.3 F (36.3 C) (01/05 0335) Temp Source: Temporal (01/05 0335) BP: 90/65 (01/05 0335) Pulse Rate: 73 (01/05 0335)  Labs: Recent Labs    11/05/24 1702 11/06/24 1455 11/07/24 0158 11/07/24 1217 11/07/24 2144  HGB 11.0* 10.1* 8.8*  --   --   HCT 36.0* 32.9* 28.5*  --   --   PLT 496* 386 349  --   --   APTT  --   --   --  36 157*  HEPARINUNFRC  --   --   --  >1.10*  --   CREATININE 0.85 0.63 0.71  --   --     Estimated Creatinine Clearance: 70 mL/min (by C-G formula based on SCr of 0.71 mg/dL).   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 Rockey Peru.    Peripheral vascular disease    Abdominal Aortic Aneursym   Pneumonia    as a child   Radiation 09/18/15-10/25/15   left lower lobe 70.2 Gy   Seizures (HCC) 02/18/2020   Tobacco abuse    Assessment: 23  yoF with a history of diastolic CHF, COPD, CAD/CABG, DM2, and hx of PE on Eliquis . Patient is presenting with worsening shortness of breath. Heparin  per pharmacy consult placed for VTE treatment in preparation for thoracentesis.  Patient was on Eliquis  prior to arrival. Last dose was on 01/03 at 2132. Utilizing aPTT monitoring due to likely falsely high anti-Xa level secondary to DOAC use.  aPTT 157, confirmed drawn on opposite arm of heparin  infusion. No s/sx of bleeding per RN, HgB 8.8 and PLTS 349.   AM: patient with stroke-like symptoms, heparin  gtt paused ~0145 for STAT CT and MRI negative for stroke okay per neuro and primary to restart heparin . Will start at same rate and f/u levels when at steady state.   Goal of Therapy:  Heparin  level 0.3-0.7 units/ml aPTT 66-102 seconds Monitor platelets by anticoagulation protocol: Yes   Plan:  Resume heparin  at 1250u/hr (16u//kg from 18u/kg)  Check aPTT in 8 hours Monitor with aPTTs until correlates with heparin  level Monitor for s/sx of bleeding. Continue to monitor H&H and platelets  Robert Bates, PharmD, BCPS Clinical Pharmacist 11/08/2024 3:43 AM     [1]  Allergies Allergen Reactions   Iodine Swelling  Neck, gland swelling   Iohexol  Swelling    Neck, gland swelling   Iodinated Contrast Media    Metformin  And Related Diarrhea   Metformin  Nausea Only

## 2024-11-08 NOTE — Progress Notes (Signed)
 " PROGRESS NOTE    Robert Bates  FMW:[id number] DOB: Oct 11, 1941 DOA: 11/05/2024 PCP: Daryl Setter, NP   83/M with history of diastolic CHF, COPD, non-small cell lung cancer SP XRT, currently under observation, CAD/CABG, type 2 diabetes mellitus, seizure disorder, neuropathy, anxiety, recurrent pleural effusion, BPH, prior chronic pain, recent long hospitalization at [facility] from 12/15-1/1 for CHF exacerbation A-fib RVR, pleural effusion and concern for PE on VQ scan, treated with diuretics, thoracentesis and started on Eliquis  prior to discharge, cytology was negative for malignant cells then.  Could not get approval for SNF eventually went to a motel, reported to be currently homeless.  Presented to the ED with worsening shortness of breath cough.  In the ED he was noted to be in A-fib, mildly tachypneic, on 3 L O2, labs noted hemoglobin 11, WBC 17, troponin 27, proBNP 981, chest x-ray with lower lobe opacities and left pleural effusion, CT chest noted left upper lobe pneumonia, left lower lobe lingular nodal disease could be infectious versus inflammatory, moderate left pleural effusion, scattered ground glass opacities, scarring and emphysema. - Admitted, started on diuretics and antibiotics - 1/5 early a.m. noted to have difficulty with his tongue and slurred speech, trouble swallowing, code stroke was initiated, CT head -no acute findings, MRI also negative for acute findings, meningiomas noted  Subjective: - Complains of discomfort with his tongue, pain, some difficulty swallowing  Assessment and Plan:  Acute respiratory failure with hypoxia Acute on chronic diastolic CHF -Presenting with recurrent respiratory failure after 17-day hospitalization at OSH - Echo in 6/25 noted EF of 60-65%, grade 2 DD, normal RV - Suspect his pleural effusions are multifactorial, continue IV Lasix  today, Farxiga  - Continue Aldactone  - Discussed thoracentesis, patient refuses this  today  Stage II non-small cell lung cancer  Recurrent pleural effusion -Patient has history of lung cancer status post radiation treatment. - History of recurrent left pleural effusion, multiple thoracentesis, cytology in December was negative for malignant cells -Felt to be related to combination of CHF and radiation fibrosis -Lung cancer is followed by Dr. Gatha, currently under observation only - Repeat x-ray tomorrow, declines thoracentesis today, Eliquis  on hold  Tongue swelling, appears to be unilateral swelling and erythema on his left tongue -Edentulous, mechanical injury versus allergic reaction could be a possibility -Supportive care, stop vancomycin , steroid dose X1   Atrial fibrillation with RVR - Rates in the 130s-140s initially in the ED. was briefly on IV amiodarone , now off - Continue amiodarone  and diltiazem  - Eliquis  on hold in case he needed thoracocentesis, continue IV heparin  today,  ?Pneumonia > Patient with changes on imaging in the ED consistent with pneumonia however was just recently treated at outside hospital for pneumonia.  -Unclear if this is residual versus recurrent pneumonia - Continue cefepime  today, vancomycin  discontinued, SLP eval yesterday was unremarkable however with new concerns overnight will request reevaluation  Recent pulmonary embolism > Noted to have elevated D-dimer during recent admission and VQ scan consistent with PE.  Transition from Xarelto  to Eliquis  in the setting during admission. - Holding Eliquis  for possible Thora, started on IV heparin    Hypertension - Continue diuresis - Resume spironolactone     Hyperlipidemia - Continue atorvastatin    GERD - Continue PPI   Diabetes - SSI   Neuropathy - Continue Gabapentin  when confirmed   History of seizure disorder - No longer on Keppra    Anxiety - Continue home sertraline  when confirmed   COPD - Continue home Breztri  and as needed Xopenex   BPH - Continue  tamsulosin   Goals: Chronically ill male with multifactorial respiratory failure and frequent hospitalizations, will request palliative consult for goals of care   DVT prophylaxis:      Eliquis  Code Status:              DNR,  Family Communication:       None on admission  Disposition Plan: To be determined  Consultants:    Procedures:   Antimicrobials:    Objective: Vitals:   11/08/24 0200 11/08/24 0335 11/08/24 0737 11/08/24 0841  BP:  90/65 96/71   Pulse: 90 73 79   Resp: (!) 21 20 18    Temp:  (!) 97.3 F (36.3 C) 97.9 F (36.6 C)   TempSrc:  Temporal Oral   SpO2: 97% 98% 100% 100%  Weight:  69.9 kg      Intake/Output Summary (Last 24 hours) at 11/08/2024 0943 Last data filed at 11/08/2024 0900 Gross per 24 hour  Intake 1758.18 ml  Output 3900 ml  Net -2141.82 ml   Filed Weights   11/05/24 1659 11/07/24 0444 11/08/24 0335  Weight: 77.1 kg 75.6 kg 69.9 kg    Examination:  General exam: Chronically ill-appearing, AAO x 3 HEENT: Positive JVD CVS: S1-S2, irregular rhythm Lungs: Decreased breath sounds at the bases Abdomen: Soft, nontender, bowel sounds present  EXTR: 1+ edema Skin: No rashes Psychiatry:  Mood & affect appropriate.     Data Reviewed:   CBC: Recent Labs  Lab 11/05/24 1702 11/06/24 1455 11/07/24 0158 11/08/24 0847  WBC 17.4* 12.3* 12.3* 10.6*  NEUTROABS 14.4*  --   --   --   HGB 11.0* 10.1* 8.8* 8.9*  HCT 36.0* 32.9* 28.5* 29.0*  MCV 83.1 84.4 82.6 83.8  PLT 496* 386 349 322   Basic Metabolic Panel: Recent Labs  Lab 11/05/24 1702 11/05/24 1716 11/06/24 1455 11/07/24 0158 11/08/24 0847  NA 138  --  136 136 135  K 4.3  --  3.9 3.4* 3.6  CL 98  --  99 98 99  CO2 25  --  29 30 28   GLUCOSE 211*  --  134* 118* 115*  BUN 30*  --  25* 25* 19  CREATININE 0.85  --  0.63 0.71 0.67  CALCIUM  9.0  --  8.3* 7.8* 8.1*  MG  --  2.0  --   --   --    GFR: Estimated Creatinine Clearance: 69.2 mL/min (by C-G formula based on SCr of 0.67  mg/dL). Liver Function Tests: Recent Labs  Lab 11/05/24 1702 11/06/24 1455 11/07/24 0158 11/08/24 0847  AST 19 16 12* 12*  ALT 20 20 16 15   ALKPHOS 92 86 71 70  BILITOT 0.3 0.4 0.4 0.5  PROT 5.8* 5.4* 4.6* 5.0*  ALBUMIN  3.3* 2.9* 2.6* 2.6*   No results for input(s): LIPASE, AMYLASE in the last 168 hours. No results for input(s): AMMONIA in the last 168 hours. Coagulation Profile: No results for input(s): INR, PROTIME in the last 168 hours. Cardiac Enzymes: No results for input(s): CKTOTAL, CKMB, CKMBINDEX, TROPONINI in the last 168 hours. BNP (last 3 results) Recent Labs    07/30/24 1650 11/05/24 1702  PROBNP 914.0* 981.0*   HbA1C: No results for input(s): HGBA1C in the last 72 hours. CBG: Recent Labs  Lab 11/07/24 1117 11/07/24 1557 11/07/24 2105 11/08/24 0151 11/08/24 0528  GLUCAP 124* 147* 141* 127* 125*   Lipid Profile: No results for input(s): CHOL, HDL, LDLCALC, TRIG, CHOLHDL, LDLDIRECT in the last  72 hours. Thyroid  Function Tests: No results for input(s): TSH, T4TOTAL, FREET4, T3FREE, THYROIDAB in the last 72 hours. Anemia Panel: No results for input(s): VITAMINB12, FOLATE, FERRITIN, TIBC, IRON, RETICCTPCT in the last 72 hours. Urine analysis:    Component Value Date/Time   COLORURINE YELLOW 11/05/2024 1702   APPEARANCEUR CLEAR 11/05/2024 1702   LABSPEC 1.015 11/05/2024 1702   PHURINE 6.0 11/05/2024 1702   GLUCOSEU NEGATIVE 11/05/2024 1702   GLUCOSEU NEGATIVE 04/27/2018 1509   HGBUR TRACE (A) 11/05/2024 1702   HGBUR trace-lysed 06/08/2010 1317   BILIRUBINUR NEGATIVE 11/05/2024 1702   BILIRUBINUR neg 10/14/2023 1403   KETONESUR NEGATIVE 11/05/2024 1702   PROTEINUR NEGATIVE 11/05/2024 1702   UROBILINOGEN 0.2 10/14/2023 1403   UROBILINOGEN 0.2 04/27/2018 1509   NITRITE NEGATIVE 11/05/2024 1702   LEUKOCYTESUR NEGATIVE 11/05/2024 1702   Sepsis  Labs: @LABRCNTIP (procalcitonin:4,lacticidven:4)  ) Recent Results (from the past 240 hours)  Blood culture (routine x 2)     Status: None (Preliminary result)   Collection Time: 11/05/24  8:00 PM   Specimen: BLOOD  Result Value Ref Range Status   Specimen Description   Final    BLOOD LEFT ANTECUBITAL Performed at [facility], [address] [place], [place] [address]    Special Requests   Final    BOTTLES DRAWN AEROBIC AND ANAEROBIC Blood Culture adequate volume Performed at [facility], [address] [place], [place] [address]    Culture   Final    NO GROWTH 2 DAYS Performed at [facility], [address], [place], [place] [address]    Report Status PENDING  Incomplete  Blood culture (routine x 2)     Status: None (Preliminary result)   Collection Time: 11/05/24  8:10 PM   Specimen: BLOOD  Result Value Ref Range Status   Specimen Description   Final    BLOOD RIGHT ANTECUBITAL Performed at [facility], [address] [place], [place] [address]    Special Requests   Final    BOTTLES DRAWN AEROBIC AND ANAEROBIC Blood Culture adequate volume Performed at [facility], [address] [place], [place] [address]    Culture   Final    NO GROWTH 2 DAYS Performed at [facility], [address], [place], [place] [address]    Report Status PENDING  Incomplete     Radiology Studies: MR BRAIN WO CONTRAST Result Date: 11/08/2024 EXAM: MRI Brain Without Contrast 11/08/2024 02:58:55 AM TECHNIQUE: Multiplanar multisequence MRI of the head/brain was performed without the administration of intravenous contrast. COMPARISON: CT head from earlier today. CLINICAL HISTORY: Neuro deficit, acute, stroke suspected FINDINGS: BRAIN AND VENTRICLES: No acute infarct. No intracranial hemorrhage. Unchanged to approximately 2 cm anterior right temporal convexity, 1.7 cm planum sphenoidale, and 3.4 cm high right frontal convexity extra-axial  dural-based masses . Similar surrounding edema, greatest in the right temporal lobe and extending into the posterior white matter tracks of right basal ganglia. The absence of contrast limits assessment. No midline shift. No hydrocephalus. The sella is unremarkable. Normal flow voids. ORBITS: No acute abnormality. SINUSES AND MASTOIDS: Small amount of fluid in right maxillary sinus. BONES AND SOFT TISSUES: Normal marrow signal. No acute soft tissue abnormality. IMPRESSION: 1. No acute intracranial abnormality. 2. Unchanged right temporal, planum sphenoidale, and high parasagittal frontal convexity meningiomas and surrounding edema as detailed above. Electronically signed by: Gilmore Molt 11/08/2024 03:36 AM EST RP Workstation: [id number]  CT HEAD WO CONTRAST ( ) Result Date: 11/08/2024 EXAM: CT HEAD WITHOUT 11/08/2024 02:30:53 AM TECHNIQUE: CT of the head was performed without the administration of intravenous contrast. Automated exposure control, iterative reconstruction, and/or weight based adjustment of the mA/kV was utilized to reduce the radiation dose to as low as reasonably achievable. COMPARISON: CT head. Comparison from 03/23/2024. CLINICAL HISTORY: Stroke, hemorrhagic FINDINGS: BRAIN AND VENTRICLES: No acute intracranial hemorrhage. Redemonstrated right frontal and right temporal convexity masses, compatible with meningiomas. No midline shift. Please see forthcoming MRI for more sensitive evaluation for interval change. No extra-axial fluid collection. No evidence of acute infarct. No hydrocephalus. ORBITS: No acute abnormality. SINUSES AND MASTOIDS: Right maxillary sinus air-fluid level and secretions. Otherwise, clear sinuses. No mastoid effusions. SOFT TISSUES AND SKULL: No acute skull fracture. No acute soft tissue abnormality. IMPRESSION: 1. No acute intracranial abnormality. 2. Redemonstrated right frontal and right temporal convexity masses, compatible with meningiomas. Please see  forthcoming MRI for more sensitive evaluation for interval change in size. Electronically signed by: Gilmore Molt 11/08/2024 03:17 AM EST RP Workstation: [id number]     Scheduled Meds:  amiodarone   200 mg Oral Daily   atorvastatin   80 mg Oral Daily   budesonide -glycopyrrolate -formoterol   2 puff Inhalation BID   dapagliflozin  propanediol  5 mg Oral Daily   diltiazem   240 mg Oral Daily   furosemide   60 mg Intravenous BID   gabapentin   100 mg Oral TID   insulin  aspart  0-5 Units Subcutaneous QHS   insulin  aspart  0-6 Units Subcutaneous TID WC   pantoprazole   40 mg Oral Daily   sodium chloride  flush  3 mL Intravenous Q12H   spironolactone   25 mg Oral Daily   tamsulosin   0.4 mg Oral QHS   Continuous Infusions:  ceFEPime  (MAXIPIME ) IV 200 mL/hr at 11/08/24 0400   heparin  1,250 Units/hr (11/08/24 0400)     LOS: 2 days    Time spent:    Sigurd Pac, MD [facility]   11/08/2024, 9:43 AM    "

## 2024-11-08 NOTE — TOC Initial Note (Addendum)
 Transition of Care [facility]) - Initial/Assessment Note    Patient Details  Name: Robert Bates MRN: [id number] Date of Birth: June 19, 1941  [facility]) CM/SW Contact:    Robert Bates, LCSWA Phone Number: 11/08/2024, 10:30 AM  Clinical Narrative:   Per progression, patient is experiencing homelessness. CSW followed up with patient at bedside to discuss. Patient reported that his son was coming to the hospital and his son has been working with a child psychotherapist within the community to gain patient housing. CSW followed up with patients sister, Reena, to confirm. Reena stated that Camellia (son) is coming to visit patient today and is working with a child psychotherapist named Kristi to obtain patient a shelter to go to and then placement/housing. Per Reena Camellia lives in [place] . CSW inquired what agency Rayfield is with but Reena was not sure. CSW to follow up with Camellia [phone]) .   2:16 PM CSW spoke with Camellia, patients son, to discuss who the social worker is that family is working with. Rayfield Mayotte is the [employer] name and Camellia provided two contact numbers for her [phone] and [phone].   CSW called Kristi to gain further insight. Kristi is with [facility] health agency and stated that patient was previously at [facility] and was discharged to a motel in [place] as he was walking 200+ ft with PT and did not qualify for SNF placement at that time. Kristi stated since then, patient has gotten worse. Kristi stated if patient does not qualify for SNF STR, that [facility] can see patient at a motel after patient has resided at the motel for 2 weeks. Per Rayfield Camellia traveled for [place] to follow up with patient and provide him with enough of a financial cushion to reside at [employer] he was previously staying at.   CSW followed up with Camellia about potential placement plans pending PT/OT eval. Camellia stated that he would take patient home with him however he lives in [facility] [place]  and it is a long drive, patient is requiring an amount of care that he cannot provide, and Camellia is in the process of selling his own house. Camellia reported patient receives SSI. CSW sent a request to financial counseling for a [place] Medicaid screening.  3:22 PM Antonio with financial counseling to follow up.   CSW will continue to follow.                  Expected Discharge Plan:  (TBD) Barriers to Discharge: Continued Medical Work up   Patient Goals and CMS Choice Patient states their goals for this hospitalization and ongoing recovery are:: To get better          Expected Discharge Plan and Services In-house Referral: Clinical Social Work   Post Acute Care Choice:  (TBD) Living arrangements for the past 2 months: Homeless                                      Prior Living Arrangements/Services Living arrangements for the past 2 months: Homeless Lives with:: Self Patient language and need for interpreter reviewed:: Yes Do you feel safe going back to the place where you live?: Yes      Need for Family Participation in Patient Care: Yes (Comment) Care giver support system in place?: No (comment)   Criminal Activity/Legal Involvement Pertinent to Current Situation/Hospitalization: No - Comment as needed  Activities  of Daily Living      Permission Sought/Granted Permission sought to share information with : Family Supports Permission granted to share information with : Yes, Verbal Permission Granted  Share Information with NAME: Camellia Mems     Permission granted to share info w Relationship: son, sis  Permission granted to share info w Contact Information: [phone] (son), [phone]   (sis)  Emotional Assessment Appearance:: Appears stated age Attitude/Demeanor/Rapport: Engaged Affect (typically observed): Other (comment) (Confused) Orientation: : Oriented to Self, Oriented to Place, Oriented to  Time, Oriented to Situation Alcohol / Substance Use: Not  Applicable Psych Involvement: No (comment)  Admission diagnosis:  Pleural effusion [J90] HAP (hospital-acquired pneumonia) [J18.9, Y95] Paroxysmal atrial fibrillation with RVR (HCC) [I48.0] Atrial fibrillation with controlled ventricular rate (HCC) [I48.91] Patient Active Problem List   Diagnosis Date Noted   Acute respiratory failure with hypoxia (HCC) 11/06/2024   Pulmonary embolism (HCC) 11/06/2024   Paroxysmal atrial fibrillation with RVR (HCC) 11/05/2024   PAF (paroxysmal atrial fibrillation) (HCC) 10/06/2024   Diarrhea 07/22/2024   Chronic back pain 05/21/2024   Housing insecurity 05/21/2024   Anxiety 04/30/2024   Pure hypercholesterolemia 08/05/2023   Chronic atrial fibrillation with RVR (HCC) 07/29/2023   Acute on chronic diastolic CHF (congestive heart failure) (HCC) 05/09/2023   Exertional shortness of breath 04/23/2023   SOB (shortness of breath) 04/23/2023   Phimosis 03/24/2023   Grief reaction 01/17/2023   Musculoskeletal pain 07/17/2022   Pleural effusion 05/20/2022   Lower urinary tract symptoms (LUTS) 03/15/2022   Thoracic radiculopathy 03/15/2022   Chronic heart failure with preserved ejection fraction (HCC) 03/15/2022   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/15/2022   Unilateral inguinal hernia without obstruction or gangrene 01/15/2022   Secondary hypercoagulable state 05/28/2021   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   Coronary artery disease 05/08/2021   Erectile dysfunction 04/11/2021   Head trauma 03/07/2021   Left leg pain 04/07/2020   Benign prostatic hyperplasia with nocturia 03/08/2020   Seizure disorder (HCC) 02/18/2020   Persistent atrial fibrillation (HCC) 08/15/2017   Diabetes type 2, controlled (HCC) 07/31/2017   COPD GOLD II  04/03/2017   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) 09/06/2015   History of lung cancer 11/07/2014   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   Primary hypertension 04/29/2014   Meningioma (HCC) 10/25/2013   Low back  pain 10/25/2013   Osteoarthritis 08/18/2012   KERATOSIS 10/09/2010   SCIATICA, RIGHT 10/09/2010   Hearing loss 05/21/2010   Hyperlipidemia 05/14/2010   Leukocytosis 05/14/2010   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 [place] [address] Phone: [phone] Fax: [phone]  [facility] [address], [place] - [address] [place] [address] Phone: [phone] Fax: [phone]  [facility] [address] [place] - [address] [place] [address] Phone: [phone] Fax: [phone]     Social Drivers of Health (SDOH) Social History: SDOH Screenings   Food Insecurity: Low Risk (10/19/2024)   Received from [facility]  Housing: Low Risk (10/19/2024)   Received from  [facility]  Recent Concern: Housing - High Risk (09/28/2024)  Transportation Needs: No Transportation Needs (10/19/2024)   Received from [facility]  Utilities: Low Risk (10/19/2024)   Received from [facility]  Alcohol Screen: Low Risk (10/02/2022)  Depression (PHQ2-9): Medium Risk (09/28/2024)  Financial Resource Strain: Low Risk (10/19/2024)   Received from [facility]  Physical Activity: Inactive (09/28/2024)  Social Connections: Unknown (10/19/2024)   Received from [facility]  Recent Concern: Social Connections - Socially Isolated (09/28/2024)  Stress: Stress Concern Present (09/28/2024)  Tobacco Use: Medium Risk (11/05/2024)   SDOH Interventions:     Readmission Risk Interventions    08/06/2023     2:06 PM  Readmission Risk Prevention Plan  Transportation Screening Complete  HRI or Home Care Consult Complete  Social Work Consult for Recovery Care Planning/Counseling Complete  Palliative Care Screening Complete  Medication Review Oceanographer) Referral to Pharmacy

## 2024-11-08 NOTE — Progress Notes (Signed)
 Heart Failure Navigator Progress Note  Assessed for Heart & Vascular TOC clinic readiness.  Patient does not meet criteria due to EF 60-65%, has a scheduled [facility] appointment on 11/09/2024. No HF TOC per Dr. Fairy. MD with plans to consult Palliative care. .   Navigator will sign off at this time.   Stephane Haddock, BSN, Scientist, Clinical (histocompatibility And Immunogenetics) Only

## 2024-11-08 NOTE — Evaluation (Signed)
 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 Rockey [place].    Peripheral vascular disease    Abdominal Aortic Aneursym   Pneumonia    as a child   Radiation 09/18/15-10/25/15   left lower lobe 70.2 Gy   Seizures (HCC) 02/18/2020   Tobacco abuse    Past Surgical History:  Past Surgical History:  Procedure Laterality Date   CHOLECYSTECTOMY N/A 07/23/2017   Procedure: LAPAROSCOPIC CHOLECYSTECTOMY;  Surgeon: Kinsinger, Herlene Righter, MD;  Location: [facility];  Service: General;  Laterality: N/A;   CLIPPING OF ATRIAL APPENDAGE Left 05/08/2021   Procedure: CLIPPING OF ATRIAL APPENDAGE USING 45 ATRICLIP;  Surgeon: German Bartlett PEDLAR, MD;  Location: [facility];  Service: Open Heart Surgery;  Laterality: Left;   COLONOSCOPY     CORONARY  ARTERY BYPASS GRAFT N/A 05/08/2021   Procedure: CORONARY ARTERY BYPASS GRAFTING (CABG)X 3 USING LEFT INTERNAL MAMMARY ARTERY AND RIGHT GREATER SAPEHNOUS VEIN;  Surgeon: German Bartlett PEDLAR, MD;  Location: [facility];  Service: Open Heart Surgery;  Laterality: N/A;   ENDOVEIN HARVEST OF GREATER SAPHENOUS VEIN Right 05/08/2021   Procedure: ENDOVEIN HARVEST OF GREATER SAPHENOUS VEIN;  Surgeon: German Bartlett PEDLAR, MD;  Location: [facility];  Service: Open Heart Surgery;  Laterality: Right;   EYE SURGERY Bilateral    Cataracts removed w/ lens implant   HERNIA REPAIR     Left 36 years ago . Right inguinal hernia repair 10-01-17 Dr. Stevie   INGUINAL HERNIA REPAIR Right 10/01/2017   Procedure: RIGHT INGUINAL HERNIA REPAIR WITH MESH;  Surgeon: Kinsinger, Herlene Righter, MD;  Location: [facility];  Service: General;  Laterality: Right;  TAP BLOCK   INSERTION OF MESH Right 10/01/2017   Procedure: INSERTION OF MESH;  Surgeon: Stevie Herlene Righter, MD;  Location: [facility];  Service: General;  Laterality: Right;   IR THORACENTESIS ASP PLEURAL SPACE W/IMG GUIDE  05/18/2021   IR THORACENTESIS ASP PLEURAL SPACE W/IMG GUIDE  06/07/2021   LEFT HEART CATH AND CORONARY ANGIOGRAPHY N/A 04/20/2021   Procedure: LEFT HEART CATH AND CORONARY ANGIOGRAPHY;  Surgeon: Darron Deatrice LABOR, MD;  [facility]: [facility];  Service: Cardiovascular;  Laterality: N/A;   TEE WITHOUT CARDIOVERSION N/A 05/08/2021  Procedure: TRANSESOPHAGEAL ECHOCARDIOGRAM (TEE);  Surgeon: German Bartlett PEDLAR, MD;  Location: [facility] OR;  Service: Open Heart Surgery;  Laterality: N/A;   TONSILLECTOMY     TONSILLECTOMY     VIDEO BRONCHOSCOPY Bilateral 07/26/2015   Procedure: VIDEO BRONCHOSCOPY WITH FLUORO;  Surgeon: Ozell KATHEE America, MD;  [facility]: [facility];  Service: Cardiopulmonary;  Laterality: Bilateral;   VIDEO BRONCHOSCOPY WITH ENDOBRONCHIAL NAVIGATION N/A 08/23/2015   Procedure: VIDEO BRONCHOSCOPY WITH ENDOBRONCHIAL NAVIGATION;  Surgeon: Dallas KATHEE Jude, MD;  Location: [facility];  Service: Thoracic;  Laterality: N/A;   VIDEO BRONCHOSCOPY WITH ENDOBRONCHIAL ULTRASOUND N/A 08/23/2015   Procedure: VIDEO BRONCHOSCOPY WITH ENDOBRONCHIAL ULTRASOUND;  Surgeon: Dallas KATHEE Jude, MD;  Location: [facility];  Service: Thoracic;  Laterality: N/A;   HPI:  Pt is an 84 y.o. male who presented 1/2 with worsening shortness of breath. CT chest 1/2: Airspace disease throughout the left upper lobe concerning for pneumonia. Nodular airspace disease in the lingula and left lower lobe, possibly infectious/inflammatory, although neoplasm cannot be excluded. Moderate left pleural effusion, increasing since prior CT. Small scattered clustered ground-glass nodules in the right upper lobe.  PMH:  CHF, COPD, non-small cell lung cancer s/p XRT, currently under observation, CAD/CABG, type 2 diabetes mellitus, seizure disorder, neuropathy, anxiety, recurrent pleural effusion, BPH, prior chronic pain, recent long hospitalization at [facility] from 12/15-1/1 for CHF exacerbation A-fib RVR, pleural effusion and concern for PE. Initially evaluated 1/4 with no further f/u needs but with acute tongue swelling, facial droop, and slurred speech overnight. MRI negative. Repeat CXR pending.    Assessment / Plan / Recommendation  Clinical Impression  Continue regular diet with thin liquids. SLP will f/u at least briefly for ongoing assessment but do not anticipate post-acute needs if lingual edema is well-controlled with medication.   SLP reconsulted for assessment after acute R facial asymmetry in addition to lingual edema and erythema were noted overnight. He has received steroids with improved edema per RN but there are visible sores and redness lining the anterior and L surface of his tongue. This results in pain and in addition to edentulism, is suspected to contribute to mildly prolonged mastication. No signs clinically concerning for aspiration were observed with consecutive sips of thin liquids.  SLP  Visit Diagnosis: Dysphagia, unspecified (R13.10)    Aspiration Risk  Mild aspiration risk    Diet Recommendation           Other Recommendations Oral Care Recommendations: Oral care BID     Swallow Evaluation Recommendations Recommendations: PO diet PO Diet Recommendation: Regular;Thin liquids (Level 0) Liquid Administration via: Cup;Straw Medication Administration: Whole meds with liquid Supervision: Patient able to self-feed;Intermittent supervision/cueing for swallowing strategies Swallowing strategies  : Minimize environmental distractions;Slow rate;Small bites/sips Postural changes: Position pt fully upright for meals Oral care recommendations: Oral care BID (2x/day)   Assistance Recommended at Discharge    Functional Status Assessment Patient has had a recent decline in their functional status and demonstrates the ability to make significant improvements in function in a reasonable and predictable amount of time.  Frequency and Duration min 2x/week  1 week       Prognosis Prognosis for improved oropharyngeal function: Good      Swallow Study   General HPI: Pt is an 84 y.o. male who presented 1/2 with worsening shortness of breath. CT chest 1/2: Airspace disease throughout the left upper lobe concerning for pneumonia. Nodular airspace disease in the lingula and left lower lobe, possibly infectious/inflammatory, although neoplasm cannot  be excluded. Moderate left pleural effusion, increasing since prior CT. Small scattered clustered ground-glass nodules in the right upper lobe.  PMH:  CHF, COPD, non-small cell lung cancer s/p XRT, currently under observation, CAD/CABG, type 2 diabetes mellitus, seizure disorder, neuropathy, anxiety, recurrent pleural effusion, BPH, prior chronic pain, recent long hospitalization at [facility] regional from 12/15-1/1 for CHF exacerbation A-fib RVR, pleural effusion and concern for PE. Initially evaluated 1/4 with no further f/u needs but with acute  tongue swelling, facial droop, and slurred speech overnight. MRI negative. Repeat CXR pending. Type of Study: Bedside Swallow Evaluation Previous Swallow Assessment: see HPI Diet Prior to this Study: Regular;Thin liquids (Level 0) Temperature Spikes Noted: No Respiratory Status: Room air History of Recent Intubation: No Behavior/Cognition: Alert;Cooperative;Pleasant mood Oral Cavity Assessment: Edema;Erythema Oral Care Completed by SLP: No Oral Cavity - Dentition: Edentulous Vision: Functional for self-feeding Self-Feeding Abilities: Able to feed self Patient Positioning: Upright in bed Baseline Vocal Quality: Hoarse Volitional Cough: Strong Volitional Swallow: Able to elicit    Oral/Motor/Sensory Function Overall Oral Motor/Sensory Function: Mild impairment Facial ROM: Reduced right Facial Symmetry: Abnormal symmetry right   Ice Chips Ice chips: Not tested   Thin Liquid Thin Liquid: Within functional limits Presentation: Straw;Self Fed    Nectar Thick Nectar Thick Liquid: Not tested   Honey Thick Honey Thick Liquid: Not tested   Puree Puree: Not tested   Solid     Solid: Impaired Presentation: Self Fed Oral Phase Impairments: Impaired mastication      Damien Blumenthal, M.A., CCC-SLP Speech Language Pathology, Acute Rehabilitation Services  Secure Chat preferred [phone]  11/08/2024,5:02 PM

## 2024-11-08 NOTE — Significant Event (Signed)
 Rapid Response Event Note   I was notified by nursing staff of Mr. Line having new slurred speech and left facial droop. LSW was 0015 and CBG 127. Upon arrival, Mr. Rayford did have some slurred speech and he complained his throat was sore and he has trouble swallowing. I notified Dr. Vanessa and he came to bedside. Mr. Gonzales was difficult to examine as he cooperated with some parts of the exam and did not exhibit other weaknesses. A code stroke was not initiated because he is currently on heparin  and has no LVO symptoms. Dr. Vanessa ordered a CT head to r/o possible hemorrhage and MRI to r/o stroke. CT head and MRI performed. Pt was returned to his room after MRI.    MD Notified: Dr. Vanessa Call Time: 0148 Arrival Time: 0150 End Time: 0345  Griselda Alm ORN, RN

## 2024-11-08 NOTE — Telephone Encounter (Signed)
 Copied from CRM #[id number]. Topic: General - Other >> Nov 08, 2024  3:26 PM Eva FALCON wrote: Reason for CRM: Pt was calling thinking he had an appointment this week. Advise nothing is scheduled. Wanted to make Melissa and Levorn aware that he is currently in the hospital. Says he has pneumonia and they found a blood clot. Says hello and if anyone wants to reach out to him to please do.

## 2024-11-09 ENCOUNTER — Ambulatory Visit: Attending: Physician Assistant | Admitting: Physician Assistant

## 2024-11-09 ENCOUNTER — Inpatient Hospital Stay (HOSPITAL_COMMUNITY)

## 2024-11-09 DIAGNOSIS — J9601 Acute respiratory failure with hypoxia: Secondary | ICD-10-CM | POA: Diagnosis not present

## 2024-11-09 LAB — GLUCOSE, CAPILLARY
Glucose-Capillary: 146 mg/dL — ABNORMAL HIGH (ref 70–99)
Glucose-Capillary: 159 mg/dL — ABNORMAL HIGH (ref 70–99)
Glucose-Capillary: 214 mg/dL — ABNORMAL HIGH (ref 70–99)
Glucose-Capillary: 208 mg/dL — ABNORMAL HIGH (ref 70–99)

## 2024-11-09 LAB — CBC
HCT: 28.2 % — ABNORMAL LOW (ref 39.0–52.0)
Hemoglobin: 8.8 g/dL — ABNORMAL LOW (ref 13.0–17.0)
MCH: 25.5 pg — ABNORMAL LOW (ref 26.0–34.0)
MCHC: 31.2 g/dL (ref 30.0–36.0)
MCV: 81.7 fL (ref 80.0–100.0)
Platelets: 327 K/uL (ref 150–400)
RBC: 3.45 MIL/uL — ABNORMAL LOW (ref 4.22–5.81)
RDW: 17.9 % — ABNORMAL HIGH (ref 11.5–15.5)
WBC: 11.1 K/uL — ABNORMAL HIGH (ref 4.0–10.5)
nRBC: 0 % (ref 0.0–0.2)

## 2024-11-09 LAB — BASIC METABOLIC PANEL WITH GFR
Anion gap: 14 (ref 5–15)
BUN: 31 mg/dL — ABNORMAL HIGH (ref 8–23)
CO2: 25 mmol/L (ref 22–32)
Calcium: 8.7 mg/dL — ABNORMAL LOW (ref 8.9–10.3)
Chloride: 96 mmol/L — ABNORMAL LOW (ref 98–111)
Creatinine, Ser: 1.13 mg/dL (ref 0.61–1.24)
GFR, Estimated: >60 mL/min
Glucose, Bld: 213 mg/dL — ABNORMAL HIGH (ref 70–99)
Potassium: 3.8 mmol/L (ref 3.5–5.1)
Sodium: 135 mmol/L (ref 135–145)

## 2024-11-09 LAB — HEPARIN LEVEL (UNFRACTIONATED): Heparin Unfractionated: >1.1 [IU]/mL — ABNORMAL HIGH (ref 0.30–0.70)

## 2024-11-09 LAB — APTT
aPTT: 126 s — ABNORMAL HIGH (ref 24–36)
aPTT: 62 s — ABNORMAL HIGH (ref 24–36)
aPTT: 66 s — ABNORMAL HIGH (ref 24–36)

## 2024-11-09 MED ORDER — HEPARIN (PORCINE) 25000 UT/250ML-% IV SOLN: 1200.0000 [IU]/h | INTRAVENOUS | Status: DC

## 2024-11-09 MED ORDER — HEPARIN (PORCINE) 25000 UT/250ML-% IV SOLN
1250.0000 [IU]/h | INTRAVENOUS | Status: DC
  Administered 2024-11-10: 1250 [IU]/h via INTRAVENOUS
  Filled 2024-11-09: qty 250

## 2024-11-09 MED ORDER — SODIUM CHLORIDE 0.9 % IV SOLN
2.0000 g | Freq: Two times a day (BID) | INTRAVENOUS | Status: DC
  Administered 2024-11-09 – 2024-11-10 (×2): 2 g via INTRAVENOUS
  Filled 2024-11-09 (×2): qty 12.5

## 2024-11-09 MED ORDER — LACTULOSE 10 GM/15ML PO SOLN
20.0000 g | Freq: Two times a day (BID) | ORAL | Status: DC
  Administered 2024-11-09: 20 g via ORAL
  Filled 2024-11-09 (×6): qty 30

## 2024-11-09 MED ORDER — HEPARIN (PORCINE) 25000 UT/250ML-% IV SOLN
1100.0000 [IU]/h | INTRAVENOUS | Status: DC
  Administered 2024-11-09: 1100 [IU]/h via INTRAVENOUS
  Filled 2024-11-09: qty 250

## 2024-11-09 NOTE — Evaluation (Signed)
 Occupational Therapy Evaluation Patient Details Name: Robert Bates MRN: [id number] DOB: [birth year] Today's Date: 11/09/2024   History of Present Illness   Pt is a 84 y.o. male who presented 11/05/24 due to increase in SOB. CT chest on 1/2 showed airspace disease throughout the left upper lobe concerning for pneumonia. 11/08/24 code stroke was called and MRI was negative. Of note, pt had a recent admission on 12/15-1/1 and was dc to a motel. PMH: hx of PAF, tobacco use with COPD, lung CA, peripheral vascular disease, HLD, chronic diastolic CHF, DM2, CABGx3, PE     Clinical Impressions Pt reported they were able to ambulate with quad cane at PLOF. Pt at this time has limited support at dc as son lives in [place] and will be able to only visit 1 x week. Pt at this time is not sure about dc location as son and pt looking into temporary housing. At this time pt completed bed mobility with mod I, sit to stand transfers with CGA to RW and ambulated with CGA. He then completed light ADLS in standing post set up with CGA at sink level. Patient will benefit from continued inpatient follow up therapy, <3 hours/day with Acute Occupational Therapy following.      If plan is discharge home, recommend the following:   A little help with walking and/or transfers;A little help with bathing/dressing/bathroom;Assistance with cooking/housework;Assist for transportation;Help with stairs or ramp for entrance     Functional Status Assessment   Patient has had a recent decline in their functional status and demonstrates the ability to make significant improvements in function in a reasonable and predictable amount of time.     Equipment Recommendations    (TBD on housing)     Recommendations for Other Services         Precautions/Restrictions   Precautions Precautions: Fall Restrictions Weight Bearing Restrictions Per Provider Order: No     Mobility Bed Mobility Overal bed mobility: Modified  Independent             General bed mobility comments: increase in time    Transfers Overall transfer level: Needs assistance Equipment used: Rolling walker (2 wheels) Transfers: Sit to/from Stand Sit to Stand: Contact guard assist                  Balance Overall balance assessment: Needs assistance Sitting-balance support: Feet supported Sitting balance-Leahy Scale: Good     Standing balance support: Bilateral upper extremity supported, Single extremity supported Standing balance-Leahy Scale: Fair                             ADL either performed or assessed with clinical judgement   ADL Overall ADL's : Needs assistance/impaired Eating/Feeding: Independent;Sitting   Grooming: Wash/dry face;Supervision/safety;Contact guard assist;Standing   Upper Body Bathing: Set up;Sitting   Lower Body Bathing: Minimal assistance;Moderate assistance;Sit to/from stand   Upper Body Dressing : Set up;Sitting   Lower Body Dressing: Minimal assistance;Moderate assistance;Sit to/from stand   Toilet Transfer: Contact guard assist;Cueing for safety;Cueing for sequencing;Rolling walker (2 wheels)   Toileting- Clothing Manipulation and Hygiene: Contact guard assist;Sit to/from stand       Functional mobility during ADLs: Contact guard assist;Cueing for safety;Cueing for sequencing;Rolling walker (2 wheels)       Vision Baseline Vision/History: 0 No visual deficits Ability to See in Adequate Light: 0 Adequate Patient Visual Report: No change from baseline Vision Assessment?: No apparent visual deficits  Perception Perception: Within Functional Limits       Praxis Praxis: WFL       Pertinent Vitals/Pain Pain Assessment Pain Assessment: No/denies pain Faces Pain Scale: No hurt     Extremity/Trunk Assessment Upper Extremity Assessment Upper Extremity Assessment: Generalized weakness   Lower Extremity Assessment Lower Extremity Assessment: Defer to  PT evaluation   Cervical / Trunk Assessment Cervical / Trunk Assessment: Kyphotic   Communication Communication Communication: No apparent difficulties   Cognition Arousal: Alert Behavior During Therapy: WFL for tasks assessed/performed Cognition: No apparent impairments                               Following commands: Intact       Cueing  General Comments   Cueing Techniques: Verbal cues      Exercises     Shoulder Instructions      Home Living Family/patient expects to be discharged to:: Unsure                                 Additional Comments: Pt's son is attempting to work on temporary housing as pt is homeless. He reported he will be able to follow up about 1 x week to check on the pt.      Prior Functioning/Environment Prior Level of Function : Independent/Modified Independent             Mobility Comments: was using quad cane ADLs Comments: mod I    OT Problem List: Decreased strength;Decreased activity tolerance;Impaired balance (sitting and/or standing);Decreased safety awareness;Decreased knowledge of use of DME or AE   OT Treatment/Interventions: Self-care/ADL training;Therapeutic exercise;DME and/or AE instruction;Therapeutic activities;Patient/family education;Balance training      OT Goals(Current goals can be found in the care plan section)   Acute Rehab OT Goals Patient Stated Goal: to get better OT Goal Formulation: With patient Time For Goal Achievement: 11/23/24 Potential to Achieve Goals: Good   OT Frequency:  Min 2X/week    Co-evaluation              AM-PAC OT 6 Clicks Daily Activity     Outcome Measure Help from another person eating meals?: None Help from another person taking care of personal grooming?: None Help from another person toileting, which includes using toliet, bedpan, or urinal?: A Little Help from another person bathing (including washing, rinsing, drying)?: A Little Help  from another person to put on and taking off regular upper body clothing?: None Help from another person to put on and taking off regular lower body clothing?: A Little 6 Click Score: 21   End of Session Equipment Utilized During Treatment: Gait belt;Rolling walker (2 wheels) Nurse Communication: Mobility status  Activity Tolerance: Patient tolerated treatment well Patient left: in bed;with call bell/phone within reach;with bed alarm set  OT Visit Diagnosis: Unsteadiness on feet (R26.81);Muscle weakness (generalized) (M62.81)                Time: 9264-9171 OT Time Calculation (min): 53 min Charges:  OT General Charges $OT Visit: 1 Visit OT Evaluation $OT Eval Low Complexity: 1 Low OT Treatments $Self Care/Home Management : 38-52 mins  Warrick POUR OTR/L  Acute Rehab Services  [phone] office number   Warrick Berber 11/09/2024, 8:40 AM

## 2024-11-09 NOTE — Progress Notes (Addendum)
 ANTICOAGULATION CONSULT NOTE  Pharmacy Consult for Heparin  Indication: VTE treatment  Allergies[1]  Patient Measurements: Height: 5' 7 (170.2 cm) Weight: 71 kg (156 lb 8.4 oz) IBW/kg (Calculated) : 66.1 Heparin  Dosing Weight: 75.6  Vital Signs: Temp: 97.7 F (36.5 C) (01/06 1223) Temp Source: Oral (01/06 1223) BP: 110/76 (01/06 1223) Pulse Rate: 77 (01/06 1223)  Labs: Recent Labs    11/07/24 0158 11/07/24 1217 11/07/24 2144 11/08/24 0847 11/08/24 1158 11/08/24 1743 11/09/24 0308 11/09/24 0945 11/09/24 1225  HGB 8.8*  --   --  8.9*  --   --  8.8*  --   --   HCT 28.5*  --   --  29.0*  --   --  28.2*  --   --   PLT 349  --   --  322  --   --  327  --   --   APTT  --  36   < >  --    < > 98* 126*  --  62*  HEPARINUNFRC  --  >1.10*  --   --   --   --  >1.10*  --   --   CREATININE 0.71  --   --  0.67  --   --   --  1.13  --    < > = values in this interval not displayed.    Estimated Creatinine Clearance: 46.3 mL/min (by C-G formula based on SCr of 1.13 mg/dL).  Assessment: 60 yoF with a history of diastolic CHF, COPD, CAD/CABG, DM2, and hx of PE on Eliquis . Patient is presenting with worsening shortness of breath. Heparin  per pharmacy consult placed for VTE treatment in preparation for thoracentesis.  Patient was on Eliquis  prior to arrival. Last dose was on 01/03 at 2132. Utilizing aPTT monitoring due to likely falsely high anti-Xa level secondary to DOAC use.   1/5 AM: patient with stroke-like symptoms, heparin  gtt paused ~0145 for STAT CT and MRI negative for stroke okay per neuro and primary to restart heparin . Will start at same rate and f/u levels when at steady state.  1/6 aPTT 62 is subtherapeutic after recent dose decrease to 1100 units/hr. Was therapeutic on 1250 units/hr x 2 previously. No issues with bleeding or pauses per RN.   Goal of Therapy:  Heparin  level 0.3-0.7 units/ml aPTT 66-102 seconds Monitor platelets by anticoagulation protocol: Yes    Plan:  Increase heparin  drip to 1200 units/hr   aPTT in 8 hours  Monitor with aPTTs until correlates with heparin  level Monitor for s/sx of bleeding. Continue to monitor H&H and platelets  Rankin Sams, PharmD, BCPS, BCCCP Clinical Pharmacist         [1]  Allergies Allergen Reactions   Iodine Swelling    Neck, gland swelling   Iohexol  Swelling    Neck, gland swelling   Iodinated Contrast Media    Metformin  And Related Diarrhea   Metformin  Nausea Only

## 2024-11-09 NOTE — Progress Notes (Signed)
 PHARMACY - ANTICOAGULATION CONSULT NOTE  Pharmacy Consult for heparin  Indication: h/o VTE  Labs: Recent Labs    11/06/24 1455 11/07/24 0158 11/07/24 1217 11/07/24 2144 11/08/24 0847 11/08/24 1158 11/08/24 1743 11/09/24 0308  HGB 10.1* 8.8*  --   --  8.9*  --   --  8.8*  HCT 32.9* 28.5*  --   --  29.0*  --   --  28.2*  PLT 386 349  --   --  322  --   --  327  APTT  --   --  36   < >  --  74* 98* 126*  HEPARINUNFRC  --   --  >1.10*  --   --   --   --  >1.10*  CREATININE 0.63 0.71  --   --  0.67  --   --   --    < > = values in this interval not displayed.   Assessment: 83yo male supratherapeutic on heparin  after two PTTs at goal though had been trending up; no infusion issues or signs of bleeding per RN.  Goal of Therapy:  aPTT 66-102 seconds   Plan:  Decrease heparin  infusion by 2 units/kg/hr to 1100 units/hr. Check PTT in 8 hours.   Marvetta Dauphin, PharmD, BCPS 11/09/2024 5:20 AM

## 2024-11-09 NOTE — Progress Notes (Signed)
 11/09/2024 Case reviewed including [facility] notes from Dr. 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  Do not see role for tube thoracostomy at present; if refractory to diuretics and thoracenteses achieve complete lung expansion could consider talc to prevent readmissions but expect low yield with current clinical status; CXR actually looks more clear this am.  Available PRN  Rolan Sharps MD PCCM

## 2024-11-09 NOTE — [place] FL2 (Signed)
 " [place]  MEDICAID FL2 LEVEL OF CARE FORM     IDENTIFICATION  Patient Name: Robert Bates Birthdate: 08-May-1941 Sex: male Admission Date (Current Location): 11/05/2024  [facility] and [place] Number:  Producer, Television/film/video and Address:  [facility] [place], [address], [place], [place] [address]      Provider Number: [id number]  Attending Physician Name and Address:  Fairy Frames, MD  Relative Name and Phone Number:  Camellia (son) [phone]; Truddie Mems (Sister)  [phone]    Current Level of Care: Hospital Recommended Level of Care: Skilled Nursing Facility Prior Approval Number:    Date Approved/Denied:   PASRR Number: [id number]  Discharge Plan: SNF    Current Diagnoses: Patient Active Problem List   Diagnosis Date Noted   Acute respiratory failure with hypoxia (HCC) 11/06/2024   Pulmonary embolism (HCC) 11/06/2024   Paroxysmal atrial fibrillation with RVR (HCC) 11/05/2024   PAF (paroxysmal atrial fibrillation) (HCC) 10/06/2024   Diarrhea 07/22/2024   Chronic back pain 05/21/2024   Housing insecurity 05/21/2024   Anxiety 04/30/2024   Pure hypercholesterolemia 08/05/2023   Chronic atrial fibrillation with RVR (HCC) 07/29/2023   Acute on chronic diastolic CHF (congestive heart failure) (HCC) 05/09/2023   Exertional shortness of breath 04/23/2023   SOB (shortness of breath) 04/23/2023   Phimosis 03/24/2023   Grief reaction 01/17/2023   Musculoskeletal pain 07/17/2022   Pleural effusion 05/20/2022   Lower urinary tract symptoms (LUTS) 03/15/2022   Thoracic radiculopathy 03/15/2022   Chronic heart failure with preserved ejection fraction (HCC) 03/15/2022   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/15/2022   Unilateral inguinal hernia without obstruction or gangrene 01/15/2022   Secondary hypercoagulable state 05/28/2021   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   Coronary artery disease 05/08/2021   Erectile dysfunction 04/11/2021   Head trauma  03/07/2021   Left leg pain 04/07/2020   Benign prostatic hyperplasia with nocturia 03/08/2020   Seizure disorder (HCC) 02/18/2020   Persistent atrial fibrillation (HCC) 08/15/2017   Diabetes type 2, controlled (HCC) 07/31/2017   COPD GOLD II  04/03/2017   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) 09/06/2015   History of lung cancer 11/07/2014   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   Primary hypertension 04/29/2014   Meningioma (HCC) 10/25/2013   Low back pain 10/25/2013   Osteoarthritis 08/18/2012   KERATOSIS 10/09/2010   SCIATICA, RIGHT 10/09/2010   Hearing loss 05/21/2010   Hyperlipidemia 05/14/2010   Leukocytosis 05/14/2010   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   RENAL CYST, RIGHT 02/05/2010   Abdominal aortic aneurysm 01/29/2010   Lipoma 11/17/2009   MICROSCOPIC HEMATURIA 08/11/2008   GERD 07/26/2008    Orientation RESPIRATION BLADDER Height & Weight     Self, Time, Situation, Place  Normal Incontinent Weight: 156 lb 8.4 oz (71 kg) Height:  5' 7 (170.2 cm)  BEHAVIORAL SYMPTOMS/MOOD NEUROLOGICAL BOWEL NUTRITION STATUS      Continent Diet (Please see discharge summary)  AMBULATORY STATUS COMMUNICATION OF NEEDS Skin   Extensive Assist Verbally Normal                       Personal Care Assistance Level of Assistance  Bathing, Feeding, Dressing Bathing Assistance: Limited assistance Feeding assistance: Independent Dressing Assistance: Limited assistance     Functional Limitations Info  Sight, Hearing, Speech Sight Info: Adequate Hearing Info: Adequate Speech Info: Adequate    SPECIAL CARE FACTORS FREQUENCY  PT (By licensed PT), OT (By licensed OT)  PT Frequency: 5x week OT Frequency: 5x week            Contractures Contractures Info: Not present    Additional Factors Info  Code Status, Allergies, Insulin  Sliding Scale, Psychotropic Code Status Info: DNR interven Allergies Info: Iodine, Iohexol , Iodinated Contrast Media, Metformin   And Related, Metformin  Psychotropic Info: gabapentin  Insulin  Sliding Scale Info: Please see discharge summary       Current Medications (11/09/2024):  This is the current hospital active medication list Current Facility-Administered Medications  Medication Dose Route Frequency Provider Last Rate Last Admin   acetaminophen  (TYLENOL ) tablet 650 mg  650 mg Oral Q6H PRN Seena Marsa NOVAK, MD   650 mg at 11/09/24 9561   Or   acetaminophen  (TYLENOL ) suppository 650 mg  650 mg Rectal Q6H PRN Seena Marsa NOVAK, MD       amiodarone  (PACERONE ) tablet 200 mg  200 mg Oral Daily Joseph, Preetha, MD   200 mg at 11/09/24 9146   atorvastatin  (LIPITOR ) tablet 80 mg  80 mg Oral Daily Melvin, Alexander B, MD   80 mg at 11/09/24 9146   budesonide -glycopyrrolate -formoterol  (BREZTRI ) 160-9-4.8 MCG/ACT inhaler 2 puff  2 puff Inhalation BID Sundil, Subrina, MD   2 puff at 11/09/24 0907   [START ON 11/10/2024] ceFEPIme  (MAXIPIME ) 2 g in sodium chloride  0.9 % 100 mL IVPB  2 g Intravenous Q12H Fairy Frames, MD       dapagliflozin  propanediol (FARXIGA ) tablet 5 mg  5 mg Oral Daily Melvin, Alexander B, MD   5 mg at 11/09/24 1005   diltiazem  (CARDIZEM  CD) 24 hr capsule 240 mg  240 mg Oral Daily Melvin, Alexander B, MD   240 mg at 11/09/24 9146   furosemide  (LASIX ) injection 60 mg  60 mg Intravenous BID Melvin, Alexander B, MD   60 mg at 11/09/24 9146   gabapentin  (NEURONTIN ) capsule 100 mg  100 mg Oral TID Sundil, Subrina, MD   100 mg at 11/09/24 0853   heparin  ADULT infusion 100 units/mL (25000 units/250mL)  1,200 Units/hr Intravenous Continuous Fairy Frames, MD 12 mL/hr at 11/09/24 1424 1,200 Units/hr at 11/09/24 1424   insulin  aspart (novoLOG ) injection 0-5 Units  0-5 Units Subcutaneous QHS Sundil, Subrina, MD   4 Units at 11/08/24 2134   insulin  aspart (novoLOG ) injection 0-6 Units  0-6 Units Subcutaneous TID WC Sundil, Subrina, MD   1 Units at 11/09/24 1324   lactulose  (CHRONULAC ) 10 GM/15ML solution 20 g  20 g  Oral BID Joseph, Preetha, MD   20 g at 11/09/24 1009   levalbuterol  (XOPENEX ) nebulizer solution 0.63 mg  0.63 mg Nebulization Q4H PRN Sundil, Subrina, MD   0.63 mg at 11/06/24 0932   pantoprazole  (PROTONIX ) EC tablet 40 mg  40 mg Oral Daily Sundil, Subrina, MD   40 mg at 11/09/24 0853   polyethylene glycol (MIRALAX  / GLYCOLAX ) packet 17 g  17 g Oral Daily PRN Seena Marsa NOVAK, MD   17 g at 11/08/24 1704   sodium chloride  flush (NS) 0.9 % injection 3 mL  3 mL Intravenous Q12H Melvin, Alexander B, MD   3 mL at 11/09/24 1009   spironolactone  (ALDACTONE ) tablet 25 mg  25 mg Oral Daily Joseph, Preetha, MD   25 mg at 11/09/24 9146   tamsulosin  (FLOMAX ) capsule 0.4 mg  0.4 mg Oral QHS Sundil, Subrina, MD   0.4 mg at 11/08/24 2040     Discharge Medications: Please see discharge summary for a list of discharge medications.  Relevant  Imaging Results:  Relevant Lab Results:   Additional Information SSN [id number]  Luise JAYSON Pan, LCSWA     "

## 2024-11-09 NOTE — TOC Progression Note (Signed)
 Transition of Care [facility]) - Progression Note    Patient Details  Name: Robert Bates MRN: [id number] Date of Birth: 10-26-1941  [facility]) CM/SW Contact  Luise JAYSON Pan, [place] Phone Number: 11/09/2024, 4:28 PM  Clinical Narrative:   CSW followed up with patients son on PT rec for SNF. Camellia is agreeable and stated he is going to go to DSS to find a child psychotherapist to work to obtain housing for patient as well. Camellia stated a DSS worker named Chrystal may come by to visit patient.   CSW will continue to follow.    Expected Discharge Plan: Skilled Nursing Facility Barriers to Discharge: Continued Medical Work up, SNF Pending bed offer, English As A Second Language Teacher               Expected Discharge Plan and Services In-house Referral: Clinical Social Work   Post Acute Care Choice:  (TBD) Living arrangements for the past 2 months: Homeless                                       Social Drivers of Health (SDOH) Interventions SDOH Screenings   Food Insecurity: No Food Insecurity (11/08/2024)  Housing: Low Risk (11/08/2024)  Recent Concern: Housing - High Risk (09/28/2024)  Transportation Needs: No Transportation Needs (11/08/2024)  Utilities: Not At Risk (11/08/2024)  Alcohol Screen: Low Risk (10/02/2022)  Depression (PHQ2-9): Medium Risk (09/28/2024)  Financial Resource Strain: Low Risk (10/19/2024)   Received from [facility]  Physical Activity: Inactive (09/28/2024)  Social Connections: Socially Isolated (11/08/2024)  Stress: Stress Concern Present (09/28/2024)  Tobacco Use: Medium Risk (11/05/2024)    Readmission Risk Interventions    08/06/2023    2:06 PM  Readmission Risk Prevention Plan  Transportation Screening Complete  HRI or Home Care Consult Complete  Social Work Consult for Recovery Care Planning/Counseling Complete  Palliative Care Screening Complete  Medication Review Oceanographer) Referral to Pharmacy

## 2024-11-09 NOTE — Progress Notes (Signed)
 PHARMACY NOTE:  ANTIMICROBIAL RENAL DOSAGE ADJUSTMENT  Current antimicrobial regimen includes a mismatch between antimicrobial dosage and estimated renal function.  As per policy approved by the Pharmacy & Therapeutics and Medical Executive Committees, the antimicrobial dosage will be adjusted accordingly.  Current antimicrobial dosage:  Cefepime  2 g q8h   Indication: HCAP  Renal Function:  Estimated Creatinine Clearance: 46.3 mL/min (by C-G formula based on SCr of 1.13 mg/dL). []      On intermittent HD, scheduled: []      On CRRT    Antimicrobial dosage has been changed to:  Cefepime  2 g q12h   Additional comments: Scr increased from 0.67 to 1.13, will watch trend    Thank you for allowing pharmacy to be a part of this patient's care.  Rankin Sams, [facility] 11/09/2024 1:37 PM

## 2024-11-09 NOTE — Progress Notes (Signed)
 ANTICOAGULATION CONSULT NOTE  Pharmacy Consult for Heparin  Indication: VTE treatment  Allergies[1]  Patient Measurements: Height: 5' 7 (170.2 cm) Weight: 71 kg (156 lb 8.4 oz) IBW/kg (Calculated) : 66.1 Heparin  Dosing Weight: 75.6  Vital Signs: Temp: 97.2 F (36.2 C) (01/06 1945) Temp Source: Axillary (01/06 1945) BP: 110/58 (01/06 1945) Pulse Rate: 68 (01/06 1945)  Labs: Recent Labs    11/07/24 0158 11/07/24 1217 11/07/24 2144 11/08/24 0847 11/08/24 1158 11/09/24 0308 11/09/24 0945 11/09/24 1225 11/09/24 2151  HGB 8.8*  --   --  8.9*  --  8.8*  --   --   --   HCT 28.5*  --   --  29.0*  --  28.2*  --   --   --   PLT 349  --   --  322  --  327  --   --   --   APTT  --  36   < >  --    < > 126*  --  62* 66*  HEPARINUNFRC  --  >1.10*  --   --   --  >1.10*  --   --   --   CREATININE 0.71  --   --  0.67  --   --  1.13  --   --    < > = values in this interval not displayed.    Estimated Creatinine Clearance: 46.3 mL/min (by C-G formula based on SCr of 1.13 mg/dL).  Assessment: 64 yoF with a history of diastolic CHF, COPD, CAD/CABG, DM2, and hx of PE on Eliquis . Patient is presenting with worsening shortness of breath. Heparin  per pharmacy consult placed for VTE treatment in preparation for thoracentesis.  Patient was on Eliquis  prior to arrival. Last dose was on 01/03 at 2132. Utilizing aPTT monitoring due to likely falsely high anti-Xa level secondary to DOAC use.   1/5 AM: patient with stroke-like symptoms, heparin  gtt paused ~0145 for STAT CT and MRI negative for stroke okay per neuro and primary to restart heparin . Will start at same rate and f/u levels when at steady state.  aPTT 66 sec (low end of therapeutic) on infusion at 1200 units/hr. No issues with line or bleeding reported per RN.  Goal of Therapy:  Heparin  level 0.3-0.7 units/ml aPTT 66-102 seconds Monitor platelets by anticoagulation protocol: Yes   Plan:  Increase heparin  drip to 1250 units/hr to  keep in therapeutic range   aPTT in 8 hours   Vito Ralph, PharmD, BCPS Please see amion for complete clinical pharmacist phone list 11/09/2024 11:14 PM          [1]  Allergies Allergen Reactions   Iodine Swelling    Neck, gland swelling   Iohexol  Swelling    Neck, gland swelling   Iodinated Contrast Media    Metformin  And Related Diarrhea   Metformin  Nausea Only

## 2024-11-09 NOTE — Evaluation (Signed)
 Physical Therapy Evaluation Patient Details Name: Robert Bates MRN: [id number] DOB: 1941/03/17 Today's Date: 11/09/2024  History of Present Illness  Pt is a 84 y.o. male who presented 11/05/24 due to increase in SOB. CT chest on 1/2 showed airspace disease throughout the left upper lobe concerning for pneumonia. 11/08/24 code stroke was called and MRI was negative. Of note, pt had a recent admission on 12/15-1/1 and was dc to a motel. PMH: hx of PAF, tobacco use with COPD, lung CA, peripheral vascular disease, HLD, chronic diastolic CHF, DM2, CABGx3, PE  Clinical Impression  Pt admitted with above diagnosis. Pt received sitting EOB and steady in sitting. However, once up and ambulating pt was unsteady with decreased awareness of limitation. Pt wanted to ambulate without AD as he generally does not use AD. Began first 98' with CGA and only minor unsteadiness and R lean. However, as he continued became more SOB with more R lean, stagger to R and L, LOB with head turns, needed to hold to rail and then last 30' needed rail and min A from PT to prevent falling. HR up to 127 bpm and DOE 3/4. Patient will benefit from continued inpatient follow up therapy, <3 hours/day.  Pt currently with functional limitations due to the deficits listed below (see PT Problem List). Pt will benefit from acute skilled PT to increase their independence and safety with mobility to allow discharge.           If plan is discharge home, recommend the following: A little help with walking and/or transfers;A little help with bathing/dressing/bathroom;Assistance with cooking/housework;Assist for transportation   Can travel by private vehicle   Yes    Equipment Recommendations Rolling walker (2 wheels)  Recommendations for Other Services       Functional Status Assessment Patient has had a recent decline in their functional status and demonstrates the ability to make significant improvements in function in a reasonable and  predictable amount of time.     Precautions / Restrictions Precautions Precautions: Fall Recall of Precautions/Restrictions: Intact Precaution/Restrictions Comments: pt has had multiple falls in past year Restrictions Weight Bearing Restrictions Per Provider Order: No      Mobility  Bed Mobility Overal bed mobility: Modified Independent             General bed mobility comments: increase in time    Transfers Overall transfer level: Needs assistance Equipment used: Rolling walker (2 wheels) Transfers: Sit to/from Stand Sit to Stand: Contact guard assist           General transfer comment: close guarding needed, pt off balance when turning head and when distracted    Ambulation/Gait Ambulation/Gait assistance: Min assist Gait Distance (Feet): 100 Feet Assistive device: None Gait Pattern/deviations: Step-through pattern, Staggering left, Staggering right Gait velocity: decreased Gait velocity interpretation: 1.31 - 2.62 ft/sec, indicative of limited community ambulator   General Gait Details: pt wanted to try to ambulate without AD. Began with CGA with mild R lean but as he continued, lost balance several times, had several stagger steps, needed rail in hallway and then rail as well as min A at gait belt. HR up in upper 120's with 3/4 DOE. Pt very unsafe  Stairs            Wheelchair Mobility     Tilt Bed    Modified Rankin (Stroke Patients Only)       Balance Overall balance assessment: Needs assistance, History of Falls Sitting-balance support: Feet supported Sitting balance-Leahy Scale: Good  Standing balance support: Bilateral upper extremity supported, Single extremity supported Standing balance-Leahy Scale: Poor Standing balance comment: can maintain static stance without support but once he is moving needs UE support and/or min A.                             Pertinent Vitals/Pain Pain Assessment Pain Assessment:  Faces Faces Pain Scale: Hurts little more Pain Location: back with ambulation Pain Descriptors / Indicators: Discomfort, Grimacing (antalgia) Pain Intervention(s): Limited activity within patient's tolerance, Monitored during session    Home Living Family/patient expects to be discharged to:: Unsure (went to motel at last d/c) Living Arrangements: Alone                 Additional Comments: Pt's son is attempting to work on temporary housing as pt is homeless. He reported he will be able to follow up about 1 x week to check on the pt. Pt's son is disabled and cannot give physical assist    Prior Function Prior Level of Function : Independent/Modified Independent             Mobility Comments: was using quad cane, has had falls ADLs Comments: mod I     Extremity/Trunk Assessment   Upper Extremity Assessment Upper Extremity Assessment: Generalized weakness    Lower Extremity Assessment Lower Extremity Assessment: Generalized weakness    Cervical / Trunk Assessment Cervical / Trunk Assessment: Kyphotic (h/o back pain)  Communication   Communication Communication: No apparent difficulties    Cognition Arousal: Alert Behavior During Therapy: WFL for tasks assessed/performed   PT - Cognitive impairments: Problem solving, Safety/Judgement                       PT - Cognition Comments: decreased awareness of limitations and safety concerns Following commands: Intact       Cueing Cueing Techniques: Verbal cues     General Comments General comments (skin integrity, edema, etc.): Pt needed cues to turn back to room due to obvious fatigue but pt with decreased awareness of devolving gait pattern and R lean. HR 105-127 bpm, SPO2 95% on RA    Exercises     Assessment/Plan    PT Assessment Patient needs continued PT services  PT Problem List Decreased strength;Decreased activity tolerance;Decreased balance;Decreased mobility;Decreased  coordination;Decreased knowledge of use of DME;Cardiopulmonary status limiting activity;Decreased safety awareness;Pain       PT Treatment Interventions DME instruction;Gait training;Functional mobility training;Therapeutic activities;Therapeutic exercise;Balance training;Neuromuscular re-education;Patient/family education    PT Goals (Current goals can be found in the Care Plan section)  Acute Rehab PT Goals Patient Stated Goal: feel better, stay safe PT Goal Formulation: With patient Time For Goal Achievement: 11/23/24 Potential to Achieve Goals: Good    Frequency Min 2X/week     Co-evaluation               AM-PAC PT 6 Clicks Mobility  Outcome Measure Help needed turning from your back to your side while in a flat bed without using bedrails?: None Help needed moving from lying on your back to sitting on the side of a flat bed without using bedrails?: None Help needed moving to and from a bed to a chair (including a wheelchair)?: A Little Help needed standing up from a chair using your arms (e.g., wheelchair or bedside chair)?: A Little Help needed to walk in hospital room?: A Little Help needed climbing 3-5 steps with a  railing? : A Lot 6 Click Score: 19    End of Session Equipment Utilized During Treatment: Gait belt Activity Tolerance: Patient limited by fatigue Patient left: in bed;with call bell/phone within reach;with bed alarm set Nurse Communication: Mobility status PT Visit Diagnosis: Muscle weakness (generalized) (M62.81);Difficulty in walking, not elsewhere classified (R26.2);Pain;Unsteadiness on feet (R26.81) Pain - part of body:  (back)    Time: 8779-8758 PT Time Calculation (min) (ACUTE ONLY): 21 min   Charges:   PT Evaluation $PT Eval Moderate Complexity: 1 Mod   PT General Charges $$ ACUTE PT VISIT: 1 Visit         Richerd Lipoma, PT  Acute Rehab Services Secure chat preferred Office [phone]   Richerd CROME Chanita Boden 11/09/2024, 1:47  PM

## 2024-11-09 NOTE — Progress Notes (Addendum)
 "   Robert Bates  FMW:[id number] DOB: 30-Sep-1941 DOA: 11/05/2024 PCP: Daryl Setter, NP   83/M with history of diastolic CHF, COPD, non-small cell lung cancer SP XRT, currently under observation, CAD/CABG, type 2 diabetes mellitus, seizure disorder, neuropathy, anxiety, recurrent pleural effusion, BPH, prior chronic pain, recent long hospitalization at [facility] from 12/15-1/1 for CHF exacerbation A-fib RVR, pleural effusion and concern for PE on VQ scan, treated with diuretics, thoracentesis and started on Eliquis  prior to discharge, cytology was negative for malignant cells then.  Could not get approval for SNF eventually went to a motel, reported to be currently homeless.  Presented to the ED with worsening shortness of breath cough.  In the ED he was noted to be in A-fib, mildly tachypneic, on 3 L O2, labs noted hemoglobin 11, WBC 17, troponin 27, proBNP 981, chest x-ray with lower lobe opacities and left pleural effusion, CT chest noted left upper lobe pneumonia, left lower lobe lingular nodal disease could be infectious versus inflammatory, moderate left pleural effusion, scattered ground glass opacities, scarring and emphysema. - Admitted, started on diuretics and antibiotics - 1/5 early a.m. noted to have difficulty moving his tongue, pain and slurred speech,code stroke was initiated, CT head -no acute findings, MRI also negative for acute findings, meningiomas noted  Subjective: - Feels much better, tongue swelling has resolved, still with some dyspnea, overall improving  Assessment and Plan:  Acute respiratory failure with hypoxia Acute on chronic diastolic CHF -Presenting with recurrent respiratory failure after 17-day hospitalization at OSH - Echo in 6/25 noted EF of 60-65%, grade 2 DD, normal RV - Suspect his pleural effusions are likely multifactorial, 2.4 L negative, continue IV Lasix  1 more day, BMP pending this morning,  - Continue Farxiga  and Aldactone    Stage II  non-small cell lung cancer  Recurrent pleural effusion -Patient has prior history of lung cancer status post radiation treatment. - History of recurrent left pleural effusion, multiple thoracentesis, cytology 12/26 was negative for malignancy, transudative in 12/25 , prior to that 3/24 neg for malignancy as well -Lung cancer is followed by Dr. Gatha, currently under observation only - Follow-up repeat x-ray this morning, Eliquis  on hold in case he needs thoracentesis  Tongue swelling, -noted yesterday morning unilateral swelling and erythema on his left tongue -Edentulous, mechanical injury versus allergic reaction could be a possibility -improved with a dose of steroids and discontinuing vancomycin  -Symptoms have resolved   Atrial fibrillation with RVR - Rates in the 130s-140s initially in the ED. was briefly on IV amiodarone , now off - Continue amiodarone  and diltiazem  - Eliquis  on hold in case he needed thoracocentesis, continue IV heparin  today,  ?Pneumonia > Patient with changes on imaging in the ED consistent with pneumonia however was just recently treated at outside hospital for pneumonia.  -Unclear if this is residual versus recurrent pneumonia - Continue cefepime  today, vancomycin  discontinued, SLP eval yesterday was unremarkable however with new concerns overnight will request reevaluation  Recent pulmonary embolism > Noted to have elevated D-dimer during recent admission and VQ scan concerning for PE.  Transitioned from Xarelto  to Eliquis  at [facility] - Holding Eliquis  for possible Thora, started on IV heparin , continue this today   Hypertension - Continue diuresis - Resume spironolactone     Hyperlipidemia - Continue atorvastatin    GERD - Continue PPI   Diabetes - SSI   Neuropathy - Continue Gabapentin  when confirmed   History of seizure disorder - No longer on Keppra    Anxiety - Continue  home sertraline  when confirmed   COPD - Continue home Breztri   and as needed Xopenex    BPH - Continue tamsulosin   Goals: Chronically ill male with multifactorial respiratory failure and frequent hospitalizations, will request palliative consult for goals of care   DVT prophylaxis:      Eliquis  Code Status:              DNR,  Family Communication:       None on admission  Disposition Plan: To be determined  Consultants:    Procedures:   Antimicrobials:    Objective: Vitals:   11/08/24 1931 11/08/24 1940 11/08/24 2325 11/09/24 0320  BP:  (!) 85/60 104/68 99/64  Pulse: 75 74 68 61  Resp: 19 16 18    Temp:  97.6 F (36.4 C) (!) 97.4 F (36.3 C) (!) 97.3 F (36.3 C)  TempSrc:  Temporal Temporal Temporal  SpO2: 94% 93% 93% 90%  Weight:    71 kg  Height:        Intake/Output Summary (Last 24 hours) at 11/09/2024 0955 Last data filed at 11/09/2024 0758 Gross per 24 hour  Intake 2785.71 ml  Output 1800 ml  Net 985.71 ml   Filed Weights   11/07/24 0444 11/08/24 0335 11/09/24 0320  Weight: 75.6 kg 69.9 kg 71 kg    Examination:  General exam: Chronically ill-appearing, AAO x 3 HEENT: Positive JVD CVS: S1-S2, irregular rhythm Lungs: Decreased breath sounds at the bases Abdomen: Soft, nontender, bowel sounds present  EXTR:trace edema Skin: No rashes Psychiatry:  Mood & affect appropriate.     Data Reviewed:   CBC: Recent Labs  Lab 11/05/24 1702 11/06/24 1455 11/07/24 0158 11/08/24 0847 11/09/24 0308  WBC 17.4* 12.3* 12.3* 10.6* 11.1*  NEUTROABS 14.4*  --   --   --   --   HGB 11.0* 10.1* 8.8* 8.9* 8.8*  HCT 36.0* 32.9* 28.5* 29.0* 28.2*  MCV 83.1 84.4 82.6 83.8 81.7  PLT 496* 386 349 322 327   Basic Metabolic Panel: Recent Labs  Lab 11/05/24 1702 11/05/24 1716 11/06/24 1455 11/07/24 0158 11/08/24 0847  NA 138  --  136 136 135  K 4.3  --  3.9 3.4* 3.6  CL 98  --  99 98 99  CO2 25  --  29 30 28   GLUCOSE 211*  --  134* 118* 115*  BUN 30*  --  25* 25* 19  CREATININE 0.85  --  0.63 0.71 0.67  CALCIUM  9.0  --   8.3* 7.8* 8.1*  MG  --  2.0  --   --   --    GFR: Estimated Creatinine Clearance: 65.4 mL/min (by C-G formula based on SCr of 0.67 mg/dL). Liver Function Tests: Recent Labs  Lab 11/05/24 1702 11/06/24 1455 11/07/24 0158 11/08/24 0847  AST 19 16 12* 12*  ALT 20 20 16 15   ALKPHOS 92 86 71 70  BILITOT 0.3 0.4 0.4 0.5  PROT 5.8* 5.4* 4.6* 5.0*  ALBUMIN  3.3* 2.9* 2.6* 2.6*   No results for input(s): LIPASE, AMYLASE in the last 168 hours. No results for input(s): AMMONIA in the last 168 hours. Coagulation Profile: No results for input(s): INR, PROTIME in the last 168 hours. Cardiac Enzymes: No results for input(s): CKTOTAL, CKMB, CKMBINDEX, TROPONINI in the last 168 hours. BNP (last 3 results) Recent Labs    07/30/24 1650 11/05/24 1702  PROBNP 914.0* 981.0*   HbA1C: No results for input(s): HGBA1C in the last 72 hours. CBG: Recent Labs  Lab 11/08/24 0528 11/08/24 1120 11/08/24 1627 11/08/24 2115 11/09/24 0606  GLUCAP 125* 154* 224* 287* 146*   Lipid Profile: No results for input(s): CHOL, HDL, LDLCALC, TRIG, CHOLHDL, LDLDIRECT in the last 72 hours. Thyroid  Function Tests: No results for input(s): TSH, T4TOTAL, FREET4, T3FREE, THYROIDAB in the last 72 hours. Anemia Panel: No results for input(s): VITAMINB12, FOLATE, FERRITIN, TIBC, IRON, RETICCTPCT in the last 72 hours. Urine analysis:    Component Value Date/Time   COLORURINE YELLOW 11/05/2024 1702   APPEARANCEUR CLEAR 11/05/2024 1702   LABSPEC 1.015 11/05/2024 1702   PHURINE 6.0 11/05/2024 1702   GLUCOSEU NEGATIVE 11/05/2024 1702   GLUCOSEU NEGATIVE 04/27/2018 1509   HGBUR TRACE (A) 11/05/2024 1702   HGBUR trace-lysed 06/08/2010 1317   BILIRUBINUR NEGATIVE 11/05/2024 1702   BILIRUBINUR neg 10/14/2023 1403   KETONESUR NEGATIVE 11/05/2024 1702   PROTEINUR NEGATIVE 11/05/2024 1702   UROBILINOGEN 0.2 10/14/2023 1403   UROBILINOGEN 0.2 04/27/2018 1509    NITRITE NEGATIVE 11/05/2024 1702   LEUKOCYTESUR NEGATIVE 11/05/2024 1702   Sepsis Labs: @LABRCNTIP (procalcitonin:4,lacticidven:4)  ) Recent Results (from the past 240 hours)  Blood culture (routine x 2)     Status: None (Preliminary result)   Collection Time: 11/05/24  8:00 PM   Specimen: BLOOD  Result Value Ref Range Status   Specimen Description   Final    BLOOD LEFT ANTECUBITAL Performed at [facility], [address] [place], [place] [address]    Special Requests   Final    BOTTLES DRAWN AEROBIC AND ANAEROBIC Blood Culture adequate volume Performed at [facility], [address] [place], [place] [address]    Culture   Final    NO GROWTH 3 DAYS Performed at [facility], [address] [place], [place] [address]    Report Status PENDING  Incomplete  Blood culture (routine x 2)     Status: None (Preliminary result)   Collection Time: 11/05/24  8:10 PM   Specimen: BLOOD  Result Value Ref Range Status   Specimen Description   Final    BLOOD RIGHT ANTECUBITAL Performed at [facility], [address] [place], [place] [address]    Special Requests   Final    BOTTLES DRAWN AEROBIC AND ANAEROBIC Blood Culture adequate volume Performed at [facility], [address] [place], [place] [address]    Culture   Final    NO GROWTH 3 DAYS Performed at [facility], [address], [place], [place] [address]    Report Status PENDING  Incomplete     Radiology Studies: DG CHEST PORT 1 VIEW Result Date: 11/08/2024 CLINICAL DATA:  Follow-up left pleural effusion. EXAM: PORTABLE CHEST 1 VIEW COMPARISON:  11/05/2024 and chest CT dated 11/05/2024. FINDINGS: Stable normal-sized heart and left atrial clip. Mild increase in size of a small to moderate-sized left pleural effusion with increased consolidation in the left lower lobe and increased patchy and consolidative density in the left mid lung zone. Stable chronic interstitial  prominence on the right. Stable post CABG changes. Mild-to-moderate right shoulder degenerative changes. IMPRESSION: Mild increase in size of a small to moderate-sized left pleural effusion with increased consolidation in the left lower lobe and increased patchy and consolidative density in the left mid lung zone, most likely representing a combination of pneumonia and atelectasis. Electronically Signed   By: Elspeth Bathe M.D.   On: 11/08/2024 17:21   MR BRAIN WO CONTRAST  Result Date: 11/08/2024 EXAM: MRI Brain Without Contrast 11/08/2024 02:58:55 AM TECHNIQUE: Multiplanar multisequence MRI of the head/brain was performed without the administration of intravenous contrast. COMPARISON: CT head from earlier today. CLINICAL HISTORY: Neuro deficit, acute, stroke suspected FINDINGS: BRAIN AND VENTRICLES: No acute infarct. No intracranial hemorrhage. Unchanged to approximately 2 cm anterior right temporal convexity, 1.7 cm planum sphenoidale, and 3.4 cm high right frontal convexity extra-axial dural-based masses . Similar surrounding edema, greatest in the right temporal lobe and extending into the posterior white matter tracks of right basal ganglia. The absence of contrast limits assessment. No midline shift. No hydrocephalus. The sella is unremarkable. Normal flow voids. ORBITS: No acute abnormality. SINUSES AND MASTOIDS: Small amount of fluid in right maxillary sinus. BONES AND SOFT TISSUES: Normal marrow signal. No acute soft tissue abnormality. IMPRESSION: 1. No acute intracranial abnormality. 2. Unchanged right temporal, planum sphenoidale, and high parasagittal frontal convexity meningiomas and surrounding edema as detailed above. Electronically signed by: Gilmore Molt 11/08/2024 03:36 AM EST RP Workstation: [id number]   CT HEAD WO CONTRAST ( ) Result Date: 11/08/2024 EXAM: CT HEAD WITHOUT 11/08/2024 02:30:53 AM TECHNIQUE: CT of the head was performed without the administration of intravenous contrast.  Automated exposure control, iterative reconstruction, and/or weight based adjustment of the mA/kV was utilized to reduce the radiation dose to as low as reasonably achievable. COMPARISON: CT head. Comparison from 03/23/2024. CLINICAL HISTORY: Stroke, hemorrhagic FINDINGS: BRAIN AND VENTRICLES: No acute intracranial hemorrhage. Redemonstrated right frontal and right temporal convexity masses, compatible with meningiomas. No midline shift. Please see forthcoming MRI for more sensitive evaluation for interval change. No extra-axial fluid collection. No evidence of acute infarct. No hydrocephalus. ORBITS: No acute abnormality. SINUSES AND MASTOIDS: Right maxillary sinus air-fluid level and secretions. Otherwise, clear sinuses. No mastoid effusions. SOFT TISSUES AND SKULL: No acute skull fracture. No acute soft tissue abnormality. IMPRESSION: 1. No acute intracranial abnormality. 2. Redemonstrated right frontal and right temporal convexity masses, compatible with meningiomas. Please see forthcoming MRI for more sensitive evaluation for interval change in size. Electronically signed by: Gilmore Molt 11/08/2024 03:17 AM EST RP Workstation: [id number]     Scheduled Meds:  amiodarone   200 mg Oral Daily   atorvastatin   80 mg Oral Daily   budesonide -glycopyrrolate -formoterol   2 puff Inhalation BID   dapagliflozin  propanediol  5 mg Oral Daily   diltiazem   240 mg Oral Daily   furosemide   60 mg Intravenous BID   gabapentin   100 mg Oral TID   insulin  aspart  0-5 Units Subcutaneous QHS   insulin  aspart  0-6 Units Subcutaneous TID WC   lactulose   20 g Oral BID   pantoprazole   40 mg Oral Daily   sodium chloride  flush  3 mL Intravenous Q12H   spironolactone   25 mg Oral Daily   tamsulosin   0.4 mg Oral QHS   Continuous Infusions:  ceFEPime  (MAXIPIME ) IV Stopped (11/09/24 0355)   heparin  1,100 Units/hr (11/09/24 0853)     LOS: 3 days    Time spent:    Sigurd Pac, MD [facility]   11/09/2024, 9:55 AM    "

## 2024-11-10 DIAGNOSIS — G40909 Epilepsy, unspecified, not intractable, without status epilepticus: Secondary | ICD-10-CM | POA: Diagnosis not present

## 2024-11-10 DIAGNOSIS — J449 Chronic obstructive pulmonary disease, unspecified: Secondary | ICD-10-CM | POA: Diagnosis not present

## 2024-11-10 DIAGNOSIS — I714 Abdominal aortic aneurysm, without rupture, unspecified: Secondary | ICD-10-CM

## 2024-11-10 DIAGNOSIS — E119 Type 2 diabetes mellitus without complications: Secondary | ICD-10-CM | POA: Diagnosis not present

## 2024-11-10 DIAGNOSIS — I10 Essential (primary) hypertension: Secondary | ICD-10-CM | POA: Diagnosis not present

## 2024-11-10 DIAGNOSIS — K219 Gastro-esophageal reflux disease without esophagitis: Secondary | ICD-10-CM | POA: Diagnosis not present

## 2024-11-10 DIAGNOSIS — C3432 Malignant neoplasm of lower lobe, left bronchus or lung: Secondary | ICD-10-CM | POA: Diagnosis not present

## 2024-11-10 DIAGNOSIS — I5033 Acute on chronic diastolic (congestive) heart failure: Secondary | ICD-10-CM | POA: Diagnosis not present

## 2024-11-10 DIAGNOSIS — I2510 Atherosclerotic heart disease of native coronary artery without angina pectoris: Secondary | ICD-10-CM | POA: Diagnosis not present

## 2024-11-10 DIAGNOSIS — I2699 Other pulmonary embolism without acute cor pulmonale: Secondary | ICD-10-CM | POA: Diagnosis not present

## 2024-11-10 DIAGNOSIS — I480 Paroxysmal atrial fibrillation: Secondary | ICD-10-CM | POA: Diagnosis not present

## 2024-11-10 LAB — BASIC METABOLIC PANEL WITH GFR
Anion gap: 8 (ref 5–15)
BUN: 27 mg/dL — ABNORMAL HIGH (ref 8–23)
CO2: 29 mmol/L (ref 22–32)
Calcium: 8.4 mg/dL — ABNORMAL LOW (ref 8.9–10.3)
Chloride: 100 mmol/L (ref 98–111)
Creatinine, Ser: 0.81 mg/dL (ref 0.61–1.24)
GFR, Estimated: >60 mL/min
Glucose, Bld: 109 mg/dL — ABNORMAL HIGH (ref 70–99)
Potassium: 4.1 mmol/L (ref 3.5–5.1)
Sodium: 137 mmol/L (ref 135–145)

## 2024-11-10 LAB — CBC
HCT: 27.4 % — ABNORMAL LOW (ref 39.0–52.0)
Hemoglobin: 8.6 g/dL — ABNORMAL LOW (ref 13.0–17.0)
MCH: 25.8 pg — ABNORMAL LOW (ref 26.0–34.0)
MCHC: 31.4 g/dL (ref 30.0–36.0)
MCV: 82.3 fL (ref 80.0–100.0)
Platelets: 292 K/uL (ref 150–400)
RBC: 3.33 MIL/uL — ABNORMAL LOW (ref 4.22–5.81)
RDW: 18.3 % — ABNORMAL HIGH (ref 11.5–15.5)
WBC: 11 K/uL — ABNORMAL HIGH (ref 4.0–10.5)
nRBC: 0 % (ref 0.0–0.2)

## 2024-11-10 LAB — GLUCOSE, CAPILLARY
Glucose-Capillary: 109 mg/dL — ABNORMAL HIGH (ref 70–99)
Glucose-Capillary: 143 mg/dL — ABNORMAL HIGH (ref 70–99)
Glucose-Capillary: 122 mg/dL — ABNORMAL HIGH (ref 70–99)
Glucose-Capillary: 146 mg/dL — ABNORMAL HIGH (ref 70–99)

## 2024-11-10 LAB — HEPARIN LEVEL (UNFRACTIONATED): Heparin Unfractionated: >1.1 [IU]/mL — ABNORMAL HIGH (ref 0.30–0.70)

## 2024-11-10 LAB — APTT: aPTT: 132 s — ABNORMAL HIGH (ref 24–36)

## 2024-11-10 MED ORDER — APIXABAN 5 MG PO TABS
5.0000 mg | ORAL_TABLET | Freq: Two times a day (BID) | ORAL | Status: DC
  Administered 2024-11-10 – 2024-11-12 (×4): 5 mg via ORAL
  Filled 2024-11-10 (×4): qty 1

## 2024-11-10 MED ORDER — HEPARIN (PORCINE) 25000 UT/250ML-% IV SOLN: 1100.0000 [IU]/h | INTRAVENOUS | Status: DC

## 2024-11-10 NOTE — Assessment & Plan Note (Addendum)
 Patient was placed on insulin  sliding scale for  glucose cover and monitoring during his hospitalization.  Fasting glucose 136 mg.dl today

## 2024-11-10 NOTE — Assessment & Plan Note (Signed)
 No acute coronary syndrome.

## 2024-11-10 NOTE — Assessment & Plan Note (Addendum)
 Continue blood pressure monitoring  Resume lisinopril  at the time of discharge.

## 2024-11-10 NOTE — Progress Notes (Signed)
 ANTICOAGULATION CONSULT NOTE  Pharmacy Consult for Heparin  Indication: VTE treatment  Allergies[1]  Patient Measurements: Height: 5' 7 (170.2 cm) Weight: 70.9 kg (156 lb 4.9 oz) IBW/kg (Calculated) : 66.1 Heparin  Dosing Weight: 75.6  Vital Signs: Temp: 97.3 F (36.3 C) (01/07 0818) Temp Source: Oral (01/07 0818) BP: 104/63 (01/07 0818) Pulse Rate: 63 (01/07 0818)  Labs: Recent Labs    11/07/24 1217 11/07/24 2144 11/08/24 0847 11/08/24 1158 11/09/24 0308 11/09/24 0945 11/09/24 1225 11/09/24 2151 11/10/24 0625 11/10/24 0930  HGB  --    < > 8.9*  --  8.8*  --   --   --  8.6*  --   HCT  --   --  29.0*  --  28.2*  --   --   --  27.4*  --   PLT  --   --  322  --  327  --   --   --  292  --   APTT 36   < >  --    < > 126*  --  62* 66*  --  132*  HEPARINUNFRC >1.10*  --   --   --  >1.10*  --   --   --  >1.10*  --   CREATININE  --   --  0.67  --   --  1.13  --   --  0.81  --    < > = values in this interval not displayed.    Estimated Creatinine Clearance: 64.6 mL/min (by C-G formula based on SCr of 0.81 mg/dL).  Assessment: 39 yoF with a history of diastolic CHF, COPD, CAD/CABG, DM2, and hx of PE on Eliquis . Patient is presenting with worsening shortness of breath. Heparin  per pharmacy consult placed for VTE treatment in preparation for thoracentesis.  Patient was on Eliquis  prior to arrival. Last dose was on 01/03 at 2132. Utilizing aPTT monitoring due to likely falsely high anti-Xa level secondary to DOAC use.  Heparin  level this morning came back elevated at >1.1, aPTT came back elevated also at 132, on heparin  infusion at 1250 units/hr. Confirmed with RN that heparin  running peripherally and level drawn from opposite arm. No s/sx of bleeding or infusion issues.   Goal of Therapy:  Heparin  level 0.3-0.7 units/ml aPTT 66-102 seconds Monitor platelets by anticoagulation protocol: Yes   Plan:  Hold heparin  infusion for 1 hour Reduce heparin  infusion to 1100 units/hr   aPTT in 8 hours after restart  Monitor with aPTTs until correlates with heparin  level Monitor for s/sx of bleeding. Continue to monitor H&H and platelets  Thank you for allowing pharmacy to participate in this patient's care,  Suzen Sour, PharmD, BCCCP Clinical Pharmacist  Phone: [phone] 11/10/2024 10:30 AM  Please check AMION for all [facility] phone numbers After 10:00 PM, call Main Pharmacy [phone]      [1]  Allergies Allergen Reactions   Iodine Swelling    Neck, gland swelling   Iohexol  Swelling    Neck, gland swelling   Iodinated Contrast Media    Metformin  And Related Diarrhea   Metformin  Nausea Only

## 2024-11-10 NOTE — Assessment & Plan Note (Addendum)
 Continue bronchodilator and airway clearing techniques

## 2024-11-10 NOTE — Assessment & Plan Note (Signed)
 Patient not longer on keppra 

## 2024-11-10 NOTE — Plan of Care (Signed)
" °  Problem: Clinical Measurements: Goal: Will remain free from infection Outcome: Completed/Met   Problem: Nutrition: Goal: Adequate nutrition will be maintained Outcome: Completed/Met   Problem: Pain Managment: Goal: General experience of comfort will improve and/or be controlled Outcome: Completed/Met   Problem: Safety: Goal: Ability to remain free from injury will improve Outcome: Completed/Met   "

## 2024-11-10 NOTE — Progress Notes (Signed)
 " Progress Note   Patient: Robert Bates FMW:[id number] DOB: 10-17-1941 DOA: 11/05/2024     4 DOS: the patient was seen and examined on 11/10/2024   Brief hospital course: Mr. Goyer was admitted to the hospital with the working diagnosis of heart failure exacerbation   83/M with history of diastolic CHF, COPD, non-small cell lung cancer SP XRT, currently under observation, CAD/CABG, type 2 diabetes mellitus, seizure disorder, neuropathy, anxiety, recurrent pleural effusion, BPH, prior chronic pain, recent long hospitalization at [facility] from 12/15-1/1 for CHF exacerbation A-fib RVR, pleural effusion and concern for PE on VQ scan, treated with diuretics, thoracentesis and started on Eliquis  prior to discharge, cytology was negative for malignant cells then.  Could not get approval for SNF eventually went to a motel, reported to be currently homeless.  Presented to the ED with worsening shortness of breath cough.  In the ED he was noted to be in A-fib, mildly tachypneic, on 3 L O2, labs noted hemoglobin 11, WBC 17, troponin 27, proBNP 981, chest x-ray with lower lobe opacities and left pleural effusion, CT chest noted left upper lobe pneumonia, left lower lobe lingular nodal disease could be infectious versus inflammatory, moderate left pleural effusion, scattered ground glass opacities, scarring and emphysema. - Admitted, started on diuretics and antibiotics - 1/5 early a.m. noted to have difficulty moving his tongue, pain and slurred speech,code stroke was initiated, CT head -no acute findings, MRI also negative for acute findings, meningiomas noted  Assessment and Plan: Acute on chronic diastolic CHF (congestive heart failure) (HCC) Echocardiogram with preserved LV systolic function with EF 60 to 65%, mild LVH, grade II diastolic dysfunction, RV systolic function preserved, LA with mild dilatation.   Documented urine output is 625 ml Systolic blood pressure 100 mmHg.   Patient has been on  furosemide  IV  Continue spironolactone  and SGLT 2 inh   Acute hypoxemic respiratory failure due to acute cardiogenic pulmonary edema with bilateral pleural effusions.  01/06 follow up chest radiograph with hyperinflation, cardiomegaly, small left pleural effusion, sternotomy wires in place.  02 saturation 94% on room air  Patient has chronic changes left upper lobe, will rule out bacterial pneumonia and will discontinue antibiotic therapy   Coronary artery disease No acute coronary syndrome   Primary hypertension Continue blood pressure monitoring   Paroxysmal atrial fibrillation with RVR (HCC) Continue rate control with diltiazem   No plan thoracentesis, will transition back to apixaban  and continue telemetry monitoring   Diabetes type 2, controlled (HCC) Continue glucose cover and monitoring with insulin  sliding scale   COPD GOLD II  Continue bronchodilator and airway clearing techniques Continue oxymetry monitoring   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  Continue antiacid therapy   Seizure disorder [facility]) Patient not longer on keppra          Subjective: Patient is feeling better, dyspnea is improving, no chest pain, no PND or orthopnea, lower extremity edema more right than left   Physical Exam: Vitals:   11/10/24 0739 11/10/24 0818 11/10/24 1123 11/10/24 1602  BP:  104/63 109/69 106/68  Pulse:  63 62 66  Resp:  14 (!) 21 20  Temp:  (!) 97.3 F (36.3 C) (!) 97.5 F (36.4 C) 98.2 F (36.8 C)  TempSrc:  Oral Oral Oral  SpO2: 96% 93% 93% 94%  Weight:      Height:  Neurology awake and alert, deconditioned ENT with mild pallor with no icterus Cardiovascular with S1 and S2 present and regular with no gallops, rubs or murmurs No JVD Respiratory with expiratory wheezing and prolonged expiratory phase with no  rhonchi or rales Abdomen with no distention, soft and non tender Positive lower extremity edema +   Data Reviewed:    Family Communication: no family at the bedside   Disposition: Status is: Inpatient Remains inpatient appropriate because: IV diuresis   Planned Discharge Destination: Home      Author: Elidia Toribio Furnace, MD 11/10/2024 4:51 PM  For on call review [email or web address].  "

## 2024-11-10 NOTE — Assessment & Plan Note (Signed)
"  Continue anticoagulation with apixaban   "

## 2024-11-10 NOTE — Assessment & Plan Note (Addendum)
 Continue rate control with diltiazem  and amiodarone   Continue anticoagulation with apixaban 

## 2024-11-10 NOTE — TOC Progression Note (Addendum)
 Transition of Care [facility]) - Progression Note    Patient Details  Name: Robert Bates MRN: [id number] Date of Birth: 19-Jan-1941  Transition of Care [facility]) CM/SW Contact  Luise JAYSON Pan, [place] Phone Number: 11/10/2024, 8:30 AM  Clinical Narrative:   CSW received update from Antionio with financial counseling that patients Medicaid screening went well and the application process will move forward. The application is anticipated to be faxed over to DSS within 24-48hrs.  12:05 PM CSW provided patient bed offers and medicare.gov ratings for STR. CSW inquired about patient will be going after STR. Patient stated he would like to go back to the motel he was at in [place]. Patient and CSW discussed other facility placement. CSW discussed if patient is to go to an ALF or other placement facility, patient would have to overturn his check to the facility. Patient is disagreeable to turning over his check at this time. CSW will continue to follow.    2:41 PM CSW attempted to call Camellia at [phone], to inform that CSW has bed offers for STR. Camellia provided CSW his email to send bed offers to: [email or web address]. CSW reviewed current PT note with Camellia and informed him that insurance may not approve of patient to go to STR at this time. Camellia expressed his understanding. CSW informed Camellia that patient stated he felt comfortable going back to a motel in [place]. Camellia expressed his disagreement with patient going to a motel. CSW empathizefd with Camellia but reminded him that patient is x4 and has capacity to make his own decisions. CSW stated that patient did not decline SNF and is open to short term rehab. CSW will continue working patient up for short term rehab.   Expected Discharge Plan: Skilled Nursing Facility Barriers to Discharge: Continued Medical Work up, SNF Pending bed offer, English As A Second Language Teacher               Expected Discharge Plan and Services In-house Referral: Clinical Social  Work   Post Acute Care Choice:  (TBD) Living arrangements for the past 2 months: Homeless                                       Social Drivers of Health (SDOH) Interventions SDOH Screenings   Food Insecurity: No Food Insecurity (11/08/2024)  Housing: Low Risk (11/08/2024)  Recent Concern: Housing - High Risk (09/28/2024)  Transportation Needs: No Transportation Needs (11/08/2024)  Utilities: Not At Risk (11/08/2024)  Alcohol Screen: Low Risk (10/02/2022)  Depression (PHQ2-9): Medium Risk (09/28/2024)  Financial Resource Strain: Low Risk (10/19/2024)   Received from [facility]  Physical Activity: Inactive (09/28/2024)  Social Connections: Socially Isolated (11/08/2024)  Stress: Stress Concern Present (09/28/2024)  Tobacco Use: Medium Risk (11/05/2024)    Readmission Risk Interventions    08/06/2023    2:06 PM  Readmission Risk Prevention Plan  Transportation Screening Complete  HRI or Home Care Consult Complete  Social Work Consult for Recovery Care Planning/Counseling Complete  Palliative Care Screening Complete  Medication Review Oceanographer) Referral to Pharmacy

## 2024-11-10 NOTE — Assessment & Plan Note (Addendum)
 Echocardiogram with preserved LV systolic function with EF 60 to 65%, mild LVH, grade II diastolic dysfunction, RV systolic function preserved, LA with mild dilatation.   Patient was placed on IV furosemide  for diuresis, negative fluid balance was achieved, -6,333 ml with significant improvement in his symptoms.   Continue spironolactone  and SGLT 2 inh  Loop diuretic with furosemide   Resume lisinopril .   Acute hypoxemic respiratory failure due to acute cardiogenic pulmonary edema with bilateral pleural effusions.  01/06 follow up chest radiograph with hyperinflation, cardiomegaly, small left pleural effusion, sternotomy wires in place.  02 saturation 94% on room air  Patient has chronic changes left upper lobe, ruled out bacterial pneumonia and will discontinue antibiotic therapy

## 2024-11-10 NOTE — Hospital Course (Addendum)
 Mr. Ciresi was admitted to the hospital with the working diagnosis of heart failure exacerbation   83/M with history heart failure, COPD, non-small cell lung cancer SP XRT,  CAD/CABG, type 2 diabetes mellitus, seizure disorder, neuropathy, anxiety, recurrent pleural effusion, BPH, prior chronic pain, recent long hospitalization at [facility] regional from 12/15-1/1 for CHF exacerbation A-fib RVR, pleural effusion and concern for PE on VQ scan, treated with diuretics, thoracentesis and started on Eliquis  prior to discharge, cytology was negative for malignant cells then.  Could not get approval for SNF eventually went to a motel, reported to be currently homeless. As outpatient he developed worsening dyspnea and edema, prompting him to come back to the ED.  On his initial physical examination his blood pressure was 109/66, HR 108, RR 18 and 02 saturation 99%  Lungs with no wheezing or rhonchi, heart with S1 and S2 present and irregular, abdomen with no distention and positive lower extremity edema.   Na 13, K 4.3 Cl 98 bicarbonate 25 glucose 211 bun 30 cr 0,85  Mg 2.0 AST 19 ALT 20  BNP 981 High sensitive troponin 27 and 25  Wbc 17,4 hgb 11.0 plt 496  Urine analysis SG 1,015, negative leukocytes, trace hgb   Chest radiograph with hyperinflation and left rotation, loss lung volume on the left with dense infiltrate left upper lobe with left pleural effusion.   EKG 130 bpm, left axis, left anterior fascicular block, qtc 475, atrial fibrillation with no significant ST segment or T wave changes.   Patient placed on IV diuretics and antibiotics - 1/5 early a.m. noted to have difficulty moving his tongue, pain and slurred speech,code stroke was initiated, CT head -no acute findings, MRI also negative for acute findings, meningiomas noted.  1/8 volume status has improved. Completed antibiotic therapy with good toleration.  01/09 patient very weak and deconditioned, will be transfer to SNF for physical  therapy.

## 2024-11-10 NOTE — Progress Notes (Signed)
 Physical Therapy Treatment Patient Details Name: Robert Bates MRN: [id number] DOB: February 24, 1941 Today's Date: 11/10/2024   History of Present Illness Pt is a 84 y.o. male who presented 11/05/24 due to increase in SOB. CT chest on 1/2 showed airspace disease throughout the left upper lobe concerning for pneumonia. 11/08/24 code stroke was called and MRI was negative. Of note, pt had a recent admission on 12/15-1/1 and was dc to a motel. PMH: hx of PAF, tobacco use with COPD, lung CA, peripheral vascular disease, HLD, chronic diastolic CHF, DM2, CABGx3, PE    PT Comments  Pt tolerated session well, but continues to have poor insight into mobility deficits and safety. Pt able to ambulate with AD and no physical assistance, but had several instances of taking hands off of walker and drifts R/L when performing dual tasks. Therapist encouraged pt to use walker at discharge for improved stability with pt reporting he does not want to do that. PT will continue to treat pt while he is admitted. Patient will benefit from continued inpatient follow up therapy, <3 hours/day.    If plan is discharge home, recommend the following: A little help with walking and/or transfers;A little help with bathing/dressing/bathroom;Assistance with cooking/housework;Assist for transportation   Can travel by private vehicle     Yes  Equipment Recommendations  Rolling walker (2 wheels)    Recommendations for Other Services       Precautions / Restrictions Precautions Precautions: Fall Recall of Precautions/Restrictions: Intact Precaution/Restrictions Comments: pt has had multiple falls in past year Restrictions Weight Bearing Restrictions Per Provider Order: No     Mobility  Bed Mobility Overal bed mobility: Modified Independent             General bed mobility comments: increase in time    Transfers Overall transfer level: Needs assistance Equipment used: Rolling walker (2 wheels) Transfers: Sit to/from  Stand Sit to Stand: Contact guard assist           General transfer comment: Pt completed STS with RW and no physical assistance. VC given for UE placement and sequencing; increased time to complete.    Ambulation/Gait Ambulation/Gait assistance: Contact guard assist, +2 safety/equipment (close chair follow) Gait Distance (Feet): 200 Feet (x2) Assistive device: Rolling walker (2 wheels) Gait Pattern/deviations: Step-through pattern, Decreased dorsiflexion - right, Decreased dorsiflexion - left, Staggering left, Staggering right, Trunk flexed Gait velocity: reduced Gait velocity interpretation: <1.8 ft/sec, indicate of risk for recurrent falls   General Gait Details: Pt ambulates with increased reliance on RW for improved stability, but continues to stagger L and R when completing dual tasks, with several instances of taking hands off of walker.   Stairs             Wheelchair Mobility     Tilt Bed    Modified Rankin (Stroke Patients Only)       Balance Overall balance assessment: Needs assistance, History of Falls Sitting-balance support: No upper extremity supported, Feet supported Sitting balance-Leahy Scale: Good Sitting balance - Comments: seated EOB without UE support   Standing balance support: Bilateral upper extremity supported, During functional activity, Reliant on assistive device for balance Standing balance-Leahy Scale: Poor Standing balance comment: reliant on RW for improved stability                            Communication Communication Communication: No apparent difficulties  Cognition Arousal: Alert Behavior During Therapy: WFL for tasks assessed/performed   PT -  Cognitive impairments: Sequencing, Problem solving, Safety/Judgement                       PT - Cognition Comments: pt continues to have reduced awareness to mobility limitations and safety throughout session, reporting when he goes home he does not want to use  RW despite therapist advising to do so Following commands: Impaired Following commands impaired: Follows multi-step commands with increased time    Cueing Cueing Techniques: Gestural cues, Verbal cues  Exercises      General Comments General comments (skin integrity, edema, etc.): VSS on RA      Pertinent Vitals/Pain Pain Assessment Pain Assessment: No/denies pain Pain Intervention(s): Monitored during session    Home Living                          Prior Function            PT Goals (current goals can now be found in the care plan section) Acute Rehab PT Goals Patient Stated Goal: feel better, stay safe PT Goal Formulation: With patient Time For Goal Achievement: 11/23/24 Potential to Achieve Goals: Good Progress towards PT goals: Progressing toward goals    Frequency    Min 2X/week      PT Plan      Co-evaluation              AM-PAC PT 6 Clicks Mobility   Outcome Measure  Help needed turning from your back to your side while in a flat bed without using bedrails?: None Help needed moving from lying on your back to sitting on the side of a flat bed without using bedrails?: None Help needed moving to and from a bed to a chair (including a wheelchair)?: A Little Help needed standing up from a chair using your arms (e.g., wheelchair or bedside chair)?: A Little Help needed to walk in hospital room?: A Little Help needed climbing 3-5 steps with a railing? : A Lot 6 Click Score: 19    End of Session Equipment Utilized During Treatment: Gait belt Activity Tolerance: Patient tolerated treatment well Patient left: in bed;with call bell/phone within reach;with bed alarm set;Other (comment) (with social work) Engineer, Civil (consulting) Communication: Mobility status PT Visit Diagnosis: Muscle weakness (generalized) (M62.81);Difficulty in walking, not elsewhere classified (R26.2);Pain;Unsteadiness on feet (R26.81) Pain - part of body:  (back)     Time: 8871-8848 PT  Time Calculation (min) (ACUTE ONLY): 23 min  Charges:    $Therapeutic Activity: 23-37 mins PT General Charges $$ ACUTE PT VISIT: 1 Visit                     Leontine Hilt DPT Acute Rehab Services [phone] Prefer contact via chat    Leontine NOVAK Karimah Winquist 11/10/2024, 12:07 PM

## 2024-11-10 NOTE — Progress Notes (Signed)
 PHARMACY NOTE:  ANTIMICROBIAL RENAL DOSAGE ADJUSTMENT  Current antimicrobial regimen includes a mismatch between antimicrobial dosage and estimated renal function.  As per policy approved by the Pharmacy & Therapeutics and Medical Executive Committees, the antimicrobial dosage will be adjusted accordingly.  Current antimicrobial dosage:  cefepime  2g IV every 12 hours  Indication: HCAP  Renal Function:  Estimated Creatinine Clearance: 64.6 mL/min (by C-G formula based on SCr of 0.81 mg/dL). []      On intermittent HD, scheduled: []      On CRRT    Antimicrobial dosage has been changed to:  cefepime  2g IV every 8 hours with Scr improvement > on day #6 of antibiotics   Additional comments:   Thank you for allowing pharmacy to participate in this patient's care,  Suzen Sour, PharmD, BCCCP Clinical Pharmacist  Phone: [phone] 11/10/2024 7:43 AM  Please check AMION for all [facility] phone numbers After 10:00 PM, call Main Pharmacy [phone]

## 2024-11-10 NOTE — Progress Notes (Signed)
 Speech Language Pathology Treatment: Dysphagia  Patient Details Name: Robert Bates MRN: [id number] DOB: 04-24-1941 Today's Date: 11/10/2024 Time: 8661-8653 SLP Time Calculation (min) (ACUTE ONLY): 8 min  Assessment / Plan / Recommendation Clinical Impression  Any changes to pt's tongue now appear resolved with the exception of one sore on the anterior surface. He reports certain flavors/textures still cause soreness but he is more easily manipulating solids now. No signs clinically concerning for aspiration were observed with consecutive sips of thin liquids. Continue current diet. SLP to sign off acutely.    HPI HPI: Pt is an 84 y.o. male who presented 1/2 with worsening shortness of breath. CT chest 1/2: Airspace disease throughout the left upper lobe concerning for pneumonia. Nodular airspace disease in the lingula and left lower lobe, possibly infectious/inflammatory, although neoplasm cannot be excluded. Moderate left pleural effusion, increasing since prior CT. Small scattered clustered ground-glass nodules in the right upper lobe.  PMH:  CHF, COPD, non-small cell lung cancer s/p XRT, currently under observation, CAD/CABG, type 2 diabetes mellitus, seizure disorder, neuropathy, anxiety, recurrent pleural effusion, BPH, prior chronic pain, recent long hospitalization at [facility] regional from 12/15-1/1 for CHF exacerbation A-fib RVR, pleural effusion and concern for PE. Initially evaluated 1/4 with no further f/u needs but with acute tongue swelling, facial droop, and slurred speech overnight. MRI negative. Repeat CXR pending.      SLP Plan  All goals met        Swallow Evaluation Recommendations   Recommendations: PO diet PO Diet Recommendation: Regular;Thin liquids (Level 0) Liquid Administration via: Cup;Straw Medication Administration: Whole meds with liquid Supervision: Patient able to self-feed;Set-up assistance for safety Postural changes: Position pt fully upright for  meals Oral care recommendations: Oral care BID (2x/day)     Recommendations                     Oral care BID   PRN Dysphagia, unspecified (R13.10)     All goals met     Damien Blumenthal, M.A., CCC-SLP Speech Language Pathology, Acute Rehabilitation Services  Secure Chat preferred [phone]   11/10/2024, 2:06 PM

## 2024-11-10 NOTE — Assessment & Plan Note (Signed)
 No further tongue edema.  Continue antiacid therapy

## 2024-11-10 NOTE — Assessment & Plan Note (Signed)
 Follow up as outpatient

## 2024-11-11 DIAGNOSIS — E871 Hypo-osmolality and hyponatremia: Secondary | ICD-10-CM

## 2024-11-11 DIAGNOSIS — I5033 Acute on chronic diastolic (congestive) heart failure: Secondary | ICD-10-CM | POA: Diagnosis not present

## 2024-11-11 DIAGNOSIS — E119 Type 2 diabetes mellitus without complications: Secondary | ICD-10-CM | POA: Diagnosis not present

## 2024-11-11 DIAGNOSIS — I480 Paroxysmal atrial fibrillation: Secondary | ICD-10-CM | POA: Diagnosis not present

## 2024-11-11 DIAGNOSIS — I2510 Atherosclerotic heart disease of native coronary artery without angina pectoris: Secondary | ICD-10-CM | POA: Diagnosis not present

## 2024-11-11 LAB — CBC
HCT: 27.7 % — ABNORMAL LOW (ref 39.0–52.0)
Hemoglobin: 8.5 g/dL — ABNORMAL LOW (ref 13.0–17.0)
MCH: 25.6 pg — ABNORMAL LOW (ref 26.0–34.0)
MCHC: 30.7 g/dL (ref 30.0–36.0)
MCV: 83.4 fL (ref 80.0–100.0)
Platelets: 272 K/uL (ref 150–400)
RBC: 3.32 MIL/uL — ABNORMAL LOW (ref 4.22–5.81)
RDW: 18.2 % — ABNORMAL HIGH (ref 11.5–15.5)
WBC: 9.8 K/uL (ref 4.0–10.5)
nRBC: 0 % (ref 0.0–0.2)

## 2024-11-11 LAB — BASIC METABOLIC PANEL WITH GFR
Anion gap: 9 (ref 5–15)
BUN: 25 mg/dL — ABNORMAL HIGH (ref 8–23)
CO2: 27 mmol/L (ref 22–32)
Calcium: 8.6 mg/dL — ABNORMAL LOW (ref 8.9–10.3)
Chloride: 98 mmol/L (ref 98–111)
Creatinine, Ser: 1.06 mg/dL (ref 0.61–1.24)
GFR, Estimated: >60 mL/min
Glucose, Bld: 132 mg/dL — ABNORMAL HIGH (ref 70–99)
Potassium: 4.2 mmol/L (ref 3.5–5.1)
Sodium: 133 mmol/L — ABNORMAL LOW (ref 135–145)

## 2024-11-11 LAB — CULTURE, BLOOD (ROUTINE X 2)
Culture: NO GROWTH
Culture: NO GROWTH
Special Requests: ADEQUATE
Special Requests: ADEQUATE

## 2024-11-11 LAB — GLUCOSE, CAPILLARY
Glucose-Capillary: 139 mg/dL — ABNORMAL HIGH (ref 70–99)
Glucose-Capillary: 112 mg/dL — ABNORMAL HIGH (ref 70–99)
Glucose-Capillary: 219 mg/dL — ABNORMAL HIGH (ref 70–99)
Glucose-Capillary: 139 mg/dL — ABNORMAL HIGH (ref 70–99)
Glucose-Capillary: 217 mg/dL — ABNORMAL HIGH (ref 70–99)

## 2024-11-11 LAB — MAGNESIUM: Magnesium: 2.1 mg/dL (ref 1.7–2.4)

## 2024-11-11 MED ORDER — MAGIC MOUTHWASH W/LIDOCAINE: 5.0000 mL | Freq: Four times a day (QID) | ORAL | Status: DC | PRN

## 2024-11-11 MED ORDER — FUROSEMIDE 20 MG PO TABS
20.0000 mg | ORAL_TABLET | Freq: Every day | ORAL | Status: DC
  Administered 2024-11-12: 20 mg via ORAL
  Filled 2024-11-11: qty 1

## 2024-11-11 NOTE — Progress Notes (Addendum)
 " Progress Note   Patient: Robert Bates FMW:[id number] DOB: 11-14-1940 DOA: 11/05/2024     5 DOS: the patient was seen and examined on 11/11/2024   Brief hospital course: Robert Bates was admitted to the hospital with the working diagnosis of heart failure exacerbation   83/M with history heart failure, COPD, non-small cell lung cancer SP XRT,  CAD/CABG, type 2 diabetes mellitus, seizure disorder, neuropathy, anxiety, recurrent pleural effusion, BPH, prior chronic pain, recent long hospitalization at [facility] regional from 12/15-1/1 for CHF exacerbation A-fib RVR, pleural effusion and concern for PE on VQ scan, treated with diuretics, thoracentesis and started on Eliquis  prior to discharge, cytology was negative for malignant cells then.  Could not get approval for SNF eventually went to a motel, reported to be currently homeless. As outpatient he developed worsening dyspnea and edema, prompting him to come back to the ED.  On his initial physical examination his blood pressure was 109/66, HR 108, RR 18 and 02 saturation 99%  Lungs with no wheezing or rhonchi, heart with S1 and S2 present and irregular, abdomen with no distention and positive lower extremity edema.   Na 13, K 4.3 Cl 98 bicarbonate 25 glucose 211 bun 30 cr 0,85  Mg 2.0 AST 19 ALT 20  BNP 981 High sensitive troponin 27 and 25  Wbc 17,4 hgb 11.0 plt 496  Urine analysis SG 1,015, negative leukocytes, trace hgb   Chest radiograph with hyperinflation and left rotation, loss lung volume on the left with dense infiltrate left upper lobe with left pleural effusion.   EKG 130 bpm, left axis, left anterior fascicular block, qtc 475, atrial fibrillation with no significant ST segment or T wave changes.   Patient placed on IV diuretics and antibiotics - 1/5 early a.m. noted to have difficulty moving his tongue, pain and slurred speech,code stroke was initiated, CT head -no acute findings, MRI also negative for acute findings, meningiomas  noted.  1/8 volume status has improved. Completed antibiotic therapy with good toleration.   Assessment and Plan: Acute on chronic diastolic CHF (congestive heart failure) (HCC) Echocardiogram with preserved LV systolic function with EF 60 to 65%, mild LVH, grade II diastolic dysfunction, RV systolic function preserved, LA with mild dilatation.   Urine output 3,100 ml Systolic blood pressure 100 mmHg range   Continue spironolactone  and SGLT 2 inh  Resume oral furosemide    Acute hypoxemic respiratory failure due to acute cardiogenic pulmonary edema with bilateral pleural effusions.  01/06 follow up chest radiograph with hyperinflation, cardiomegaly, small left pleural effusion, sternotomy wires in place.  02 saturation 94% on room air  Patient has chronic changes left upper lobe, ruled out bacterial pneumonia and will discontinue antibiotic therapy   Coronary artery disease No acute coronary syndrome   Primary hypertension Continue blood pressure monitoring   Paroxysmal atrial fibrillation with RVR (HCC) Continue rate control with diltiazem  and amiodarone   Continue anticoagulation with apixaban   Diabetes type 2, controlled (HCC) Continue glucose cover and monitoring with insulin  sliding scale  Fasting glucose 132 mg.dl today   COPD GOLD II  Continue bronchodilator and airway clearing techniques Continue oxymetry monitoring   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  Continue antiacid therapy   Seizure disorder [facility]) Patient not longer on keppra    Hyponatremia Renal function stable with serum cr at 1.0 with K at 4,2 and  serum bicarbonate at 22 Na 133         Subjective: Patient is feeling better, no chest pain and no dyspnea, continue very weak and deconditioned   Physical Exam: Vitals:   11/11/24 0417 11/11/24  0747 11/11/24 0834 11/11/24 1109  BP:   (!) 107/56 108/65  Pulse:   70 66  Resp:   17 20  Temp:   97.9 F (36.6 C) (!) 97.4 F (36.3 C)  TempSrc:   Oral Oral  SpO2:  95% 94% 94%  Weight: 70.7 kg     Height:       Neurology awake and alert, deconditioned ENT with mild pallor Cardiovascular with S1 and S2 present and regular with no gallops or rubs No JVD Respiratory with no rales or wheezing, no rhonchi Abdomen with no distention, soft and non tender Trace lower extremity edema   Data Reviewed:    Family Communication: I spoke with patient's son over the phone at the bedside, we talked in detail about patient's condition, plan of care and prognosis and all questions were addressed.   Disposition: Status is: Inpatient Remains inpatient appropriate because: pending placement   Planned Discharge Destination: Home     Author: Elidia Toribio Furnace, MD 11/11/2024 2:11 PM  For on call review [email or web address].  "

## 2024-11-11 NOTE — Assessment & Plan Note (Addendum)
 At the time of discharge his renal function is stable with serum cr at 0,87 with K at 4,0 and serum bicarbonate at 26  Na 137 P 2,8   Plan to follow up renal function and electrolytes as outpatient.  Continue furosemide , spironolactone  and SGLT 2 inh

## 2024-11-11 NOTE — Care Management Important Message (Signed)
 Important Message  Patient Details  Name: Robert Bates MRN: [id number] Date of Birth: [birth year]   Important Message Given:  Yes - Medicare IM     [name] Sharps 11/11/2024, 10:15 AM

## 2024-11-11 NOTE — Significant Event (Signed)
 Rapid Response Event Note   Reason for Call :  L facial droop, slurred speech.  Initial Focused Assessment:  Pt lying in bed with eyes closed, in no visible distress. Pt is irritable and uncooperative. He will open his eyes and participate in exam when he chooses to. He says he wants to be left alone. His speech is more garbled than slurred. This clears intermittently t/o exam.  There is no facial droop on my assessment. NIH-2 for BLE drift.   T-97.5, HR-57, BP-116/67, RR-18, SpO2-93% on RA.  Interventions:  CBG-139 Plan of Care:  Continue to monitor pt closely. Please call RRT if further assistance needed.   Event Summary:   MD Notified:  Call [phone] Arrival [phone] End [id number]  Tish Graeme Piety, RN

## 2024-11-11 NOTE — Plan of Care (Signed)
 " Problem: Education: Goal: Ability to describe self-care measures that may prevent or decrease complications (Diabetes Survival Skills Education) will improve 11/11/2024 1814 by Jannie Julian, RN Outcome: Progressing 11/11/2024 1812 by Jannie Julian, RN Outcome: Progressing Goal: Individualized Educational Video(s) Outcome: Progressing   Problem: Coping: Goal: Ability to adjust to condition or change in health will improve 11/11/2024 1814 by Jannie Julian, RN Outcome: Progressing 11/11/2024 1812 by Jannie Julian, RN Outcome: Progressing   Problem: Fluid Volume: Goal: Ability to maintain a balanced intake and output will improve 11/11/2024 1814 by Jannie Julian, RN Outcome: Progressing 11/11/2024 1812 by Jannie Julian, RN Outcome: Progressing   Problem: Health Behavior/Discharge Planning: Goal: Ability to identify and utilize available resources and services will improve 11/11/2024 1814 by Jannie Julian, RN Outcome: Progressing 11/11/2024 1812 by Jannie Julian, RN Outcome: Progressing Goal: Ability to manage health-related needs will improve 11/11/2024 1814 by Jannie Julian, RN Outcome: Progressing 11/11/2024 1812 by Jannie Julian, RN Outcome: Progressing   Problem: Metabolic: Goal: Ability to maintain appropriate glucose levels will improve 11/11/2024 1814 by Jannie Julian, RN Outcome: Progressing 11/11/2024 1812 by Jannie Julian, RN Outcome: Progressing   Problem: Nutritional: Goal: Maintenance of adequate nutrition will improve 11/11/2024 1814 by Jannie Julian, RN Outcome: Progressing 11/11/2024 1812 by Jannie Julian, RN Outcome: Progressing Goal: Progress toward achieving an optimal weight will improve 11/11/2024 1814 by Jannie Julian, RN Outcome: Progressing 11/11/2024 1812 by Jannie Julian, RN Outcome: Progressing   Problem: Skin Integrity: Goal: Risk for impaired skin integrity will decrease 11/11/2024 1814 by Jannie Julian, RN Outcome: Progressing 11/11/2024 1812 by  Jannie Julian, RN Outcome: Progressing   Problem: Tissue Perfusion: Goal: Adequacy of tissue perfusion will improve 11/11/2024 1814 by Jannie Julian, RN Outcome: Progressing 11/11/2024 1812 by Jannie Julian, RN Outcome: Progressing   Problem: Education: Goal: Knowledge of General Education information will improve Description: Including pain rating scale, medication(s)/side effects and non-pharmacologic comfort measures 11/11/2024 1814 by Jannie Julian, RN Outcome: Progressing 11/11/2024 1812 by Jannie Julian, RN Outcome: Progressing   Problem: Health Behavior/Discharge Planning: Goal: Ability to manage health-related needs will improve 11/11/2024 1814 by Jannie Julian, RN Outcome: Progressing 11/11/2024 1812 by Jannie Julian, RN Outcome: Progressing   Problem: Clinical Measurements: Goal: Ability to maintain clinical measurements within normal limits will improve 11/11/2024 1814 by Jannie Julian, RN Outcome: Progressing 11/11/2024 1812 by Jannie Julian, RN Outcome: Progressing Goal: Diagnostic test results will improve 11/11/2024 1814 by Jannie Julian, RN Outcome: Progressing 11/11/2024 1812 by Jannie Julian, RN Outcome: Progressing Goal: Respiratory complications will improve 11/11/2024 1814 by Jannie Julian, RN Outcome: Progressing 11/11/2024 1812 by Jannie Julian, RN Outcome: Progressing Goal: Cardiovascular complication will be avoided 11/11/2024 1814 by Jannie Julian, RN Outcome: Progressing 11/11/2024 1812 by Jannie Julian, RN Outcome: Progressing   Problem: Activity: Goal: Risk for activity intolerance will decrease 11/11/2024 1814 by Jannie Julian, RN Outcome: Progressing 11/11/2024 1812 by Jannie Julian, RN Outcome: Progressing   Problem: Coping: Goal: Level of anxiety will decrease 11/11/2024 1814 by Jannie Julian, RN Outcome: Progressing 11/11/2024 1812 by Jannie Julian, RN Outcome: Progressing   Problem: Elimination: Goal: Will not experience complications  related to bowel motility 11/11/2024 1814 by Jannie Julian, RN Outcome: Progressing 11/11/2024 1812 by Jannie Julian, RN Outcome: Progressing Goal: Will not experience complications related to urinary retention 11/11/2024 1814 by Jannie Julian, RN Outcome: Progressing 11/11/2024 1812 by Jannie Julian, RN Outcome: Progressing   Problem: Skin Integrity: Goal: Risk for impaired skin integrity will decrease 11/11/2024 1814 by Jannie Julian, RN Outcome: Progressing 11/11/2024  1812 by Jannie Julian, RN Outcome: Progressing   Problem: Education: Goal: Ability to demonstrate management of disease process will improve 11/11/2024 1814 by Jannie Julian, RN Outcome: Progressing 11/11/2024 1812 by Jannie Julian, RN Outcome: Progressing Goal: Ability to verbalize understanding of medication therapies will improve 11/11/2024 1814 by Jannie Julian, RN Outcome: Progressing 11/11/2024 1812 by Jannie Julian, RN Outcome: Progressing Goal: Individualized Educational Video(s) 11/11/2024 1814 by Jannie Julian, RN Outcome: Progressing 11/11/2024 1812 by Jannie Julian, RN Outcome: Progressing   Problem: Activity: Goal: Capacity to carry out activities will improve 11/11/2024 1814 by Jannie Julian, RN Outcome: Progressing 11/11/2024 1812 by Jannie Julian, RN Outcome: Progressing   Problem: Cardiac: Goal: Ability to achieve and maintain adequate cardiopulmonary perfusion will improve 11/11/2024 1814 by Jannie Julian, RN Outcome: Progressing 11/11/2024 1812 by Jannie Julian, RN Outcome: Progressing   "

## 2024-11-11 NOTE — TOC Progression Note (Addendum)
 Transition of Care [facility]) - Progression Note    Patient Details  Name: Robert Bates MRN: [id number] Date of Birth: 1941-03-01  Transition of Care [facility]) CM/SW Contact  Luise JAYSON Pan, [place] Phone Number: 11/11/2024, 11:57 AM  Clinical Narrative:   CSW spoke with patient about bed choice. Patient would like to go to [facility] and rehab at this time. CSW briefly reviewed PT note with patient and informed that he did well with PT and there is a possibility insurance auth may not be approved. Patient stated he may have to go back to the motel in [place]. CSW inquired about if patient has any finances to pay for a motel. Patient stated he has enough money to pay for 2 weeks at a motel. CSW inquired about other family that patient could possibly stay with. Patient stated he cannot stay with either of his 2 nieces as they do not have room at their homes for him.   Patient inquired about if CSW knows if insurance will cover SNF. CSW informed patient that CSW does not know if insurance will approve. CSW explained that CSW will have to submit for prior auth to insurance and await their determination.   1:41 PM auth pending for [place] [facility] care. Auth id [id number].   CSW will continue to follow.    Expected Discharge Plan: Skilled Nursing Facility Barriers to Discharge: Continued Medical Work up, SNF Pending bed offer, English As A Second Language Teacher               Expected Discharge Plan and Services In-house Referral: Clinical Social Work   Post Acute Care Choice:  (TBD) Living arrangements for the past 2 months: Homeless                                       Social Drivers of Health (SDOH) Interventions SDOH Screenings   Food Insecurity: No Food Insecurity (11/08/2024)  Housing: Low Risk (11/08/2024)  Recent Concern: Housing - High Risk (09/28/2024)  Transportation Needs: No Transportation Needs (11/08/2024)  Utilities: Not At Risk (11/08/2024)  Alcohol Screen: Low Risk  (10/02/2022)  Depression (PHQ2-9): Medium Risk (09/28/2024)  Financial Resource Strain: Low Risk (10/19/2024)   Received from [facility]  Physical Activity: Inactive (09/28/2024)  Social Connections: Socially Isolated (11/08/2024)  Stress: Stress Concern Present (09/28/2024)  Tobacco Use: Medium Risk (11/05/2024)    Readmission Risk Interventions    08/06/2023    2:06 PM  Readmission Risk Prevention Plan  Transportation Screening Complete  HRI or Home Care Consult Complete  Social Work Consult for Recovery Care Planning/Counseling Complete  Palliative Care Screening Complete  Medication Review Oceanographer) Referral to Pharmacy

## 2024-11-12 ENCOUNTER — Other Ambulatory Visit (HOSPITAL_BASED_OUTPATIENT_CLINIC_OR_DEPARTMENT_OTHER): Payer: Self-pay

## 2024-11-12 DIAGNOSIS — I2510 Atherosclerotic heart disease of native coronary artery without angina pectoris: Secondary | ICD-10-CM | POA: Diagnosis not present

## 2024-11-12 DIAGNOSIS — I10 Essential (primary) hypertension: Secondary | ICD-10-CM | POA: Diagnosis not present

## 2024-11-12 DIAGNOSIS — I5033 Acute on chronic diastolic (congestive) heart failure: Secondary | ICD-10-CM | POA: Diagnosis not present

## 2024-11-12 DIAGNOSIS — I480 Paroxysmal atrial fibrillation: Secondary | ICD-10-CM | POA: Diagnosis not present

## 2024-11-12 LAB — GLUCOSE, CAPILLARY
Glucose-Capillary: 117 mg/dL — ABNORMAL HIGH (ref 70–99)
Glucose-Capillary: 158 mg/dL — ABNORMAL HIGH (ref 70–99)
Glucose-Capillary: 116 mg/dL — ABNORMAL HIGH (ref 70–99)

## 2024-11-12 LAB — RENAL FUNCTION PANEL
Albumin: 3 g/dL — ABNORMAL LOW (ref 3.5–5.0)
Anion gap: 9 (ref 5–15)
BUN: 17 mg/dL (ref 8–23)
CO2: 26 mmol/L (ref 22–32)
Calcium: 8.5 mg/dL — ABNORMAL LOW (ref 8.9–10.3)
Chloride: 102 mmol/L (ref 98–111)
Creatinine, Ser: 0.87 mg/dL (ref 0.61–1.24)
GFR, Estimated: >60 mL/min
Glucose, Bld: 136 mg/dL — ABNORMAL HIGH (ref 70–99)
Phosphorus: 2.8 mg/dL (ref 2.5–4.6)
Potassium: 4 mmol/L (ref 3.5–5.1)
Sodium: 137 mmol/L (ref 135–145)

## 2024-11-12 MED ORDER — DILTIAZEM HCL ER COATED BEADS 240 MG PO CP24
240.0000 mg | ORAL_CAPSULE | Freq: Every day | ORAL | 0 refills | Status: AC
  Filled 2024-11-12: qty 30, 30d supply, fill #0

## 2024-11-12 MED ORDER — APIXABAN 5 MG PO TABS
5.0000 mg | ORAL_TABLET | Freq: Two times a day (BID) | ORAL | 0 refills | Status: AC
  Filled 2024-11-12: qty 60, 30d supply, fill #0

## 2024-11-12 NOTE — Progress Notes (Signed)
 AVS and DNR in packet for transport

## 2024-11-12 NOTE — Plan of Care (Signed)
 " Problem: Education: Goal: Ability to describe self-care measures that may prevent or decrease complications (Diabetes Survival Skills Education) will improve 11/12/2024 1554 by Jannie Julian, RN Outcome: Progressing 11/12/2024 1553 by Jannie Julian, RN Outcome: Progressing Goal: Individualized Educational Video(s) 11/12/2024 1554 by Jannie Julian, RN Outcome: Progressing 11/12/2024 1553 by Jannie Julian, RN Outcome: Progressing   Problem: Coping: Goal: Ability to adjust to condition or change in health will improve 11/12/2024 1554 by Jannie Julian, RN Outcome: Progressing 11/12/2024 1553 by Jannie Julian, RN Outcome: Progressing   Problem: Fluid Volume: Goal: Ability to maintain a balanced intake and output will improve 11/12/2024 1554 by Jannie Julian, RN Outcome: Progressing 11/12/2024 1553 by Jannie Julian, RN Outcome: Progressing   Problem: Health Behavior/Discharge Planning: Goal: Ability to identify and utilize available resources and services will improve 11/12/2024 1554 by Jannie Julian, RN Outcome: Progressing 11/12/2024 1553 by Jannie Julian, RN Outcome: Progressing Goal: Ability to manage health-related needs will improve 11/12/2024 1554 by Jannie Julian, RN Outcome: Progressing 11/12/2024 1553 by Jannie Julian, RN Outcome: Progressing   Problem: Metabolic: Goal: Ability to maintain appropriate glucose levels will improve 11/12/2024 1554 by Jannie Julian, RN Outcome: Progressing 11/12/2024 1553 by Jannie Julian, RN Outcome: Progressing   Problem: Nutritional: Goal: Maintenance of adequate nutrition will improve 11/12/2024 1554 by Jannie Julian, RN Outcome: Progressing 11/12/2024 1553 by Jannie Julian, RN Outcome: Progressing Goal: Progress toward achieving an optimal weight will improve 11/12/2024 1554 by Jannie Julian, RN Outcome: Progressing 11/12/2024 1553 by Jannie Julian, RN Outcome: Progressing   Problem: Skin Integrity: Goal: Risk for impaired skin  integrity will decrease 11/12/2024 1554 by Jannie Julian, RN Outcome: Progressing 11/12/2024 1553 by Jannie Julian, RN Outcome: Progressing   Problem: Tissue Perfusion: Goal: Adequacy of tissue perfusion will improve 11/12/2024 1554 by Jannie Julian, RN Outcome: Progressing 11/12/2024 1553 by Jannie Julian, RN Outcome: Progressing   Problem: Education: Goal: Knowledge of General Education information will improve Description: Including pain rating scale, medication(s)/side effects and non-pharmacologic comfort measures 11/12/2024 1554 by Jannie Julian, RN Outcome: Progressing 11/12/2024 1553 by Jannie Julian, RN Outcome: Progressing   Problem: Health Behavior/Discharge Planning: Goal: Ability to manage health-related needs will improve 11/12/2024 1554 by Jannie Julian, RN Outcome: Progressing 11/12/2024 1553 by Jannie Julian, RN Outcome: Progressing   Problem: Clinical Measurements: Goal: Ability to maintain clinical measurements within normal limits will improve 11/12/2024 1554 by Jannie Julian, RN Outcome: Progressing 11/12/2024 1553 by Jannie Julian, RN Outcome: Progressing Goal: Diagnostic test results will improve 11/12/2024 1554 by Jannie Julian, RN Outcome: Progressing 11/12/2024 1553 by Jannie Julian, RN Outcome: Progressing Goal: Respiratory complications will improve 11/12/2024 1554 by Jannie Julian, RN Outcome: Progressing 11/12/2024 1553 by Jannie Julian, RN Outcome: Progressing Goal: Cardiovascular complication will be avoided 11/12/2024 1554 by Jannie Julian, RN Outcome: Progressing 11/12/2024 1553 by Jannie Julian, RN Outcome: Progressing   Problem: Activity: Goal: Risk for activity intolerance will decrease 11/12/2024 1554 by Jannie Julian, RN Outcome: Progressing 11/12/2024 1553 by Jannie Julian, RN Outcome: Progressing   Problem: Coping: Goal: Level of anxiety will decrease 11/12/2024 1554 by Jannie Julian, RN Outcome: Progressing 11/12/2024 1553 by Jannie Julian, RN Outcome: Progressing   Problem: Elimination: Goal: Will not experience complications related to bowel motility 11/12/2024 1554 by Jannie Julian, RN Outcome: Progressing 11/12/2024 1553 by Jannie Julian, RN Outcome: Progressing Goal: Will not experience complications related to urinary retention 11/12/2024 1554 by Jannie Julian, RN Outcome: Progressing 11/12/2024 1553 by Jannie Julian, RN Outcome: Progressing   Problem: Skin Integrity: Goal: Risk for  impaired skin integrity will decrease 11/12/2024 1554 by Jannie Julian, RN Outcome: Progressing 11/12/2024 1553 by Jannie Julian, RN Outcome: Progressing   Problem: Education: Goal: Ability to demonstrate management of disease process will improve 11/12/2024 1554 by Jannie Julian, RN Outcome: Progressing 11/12/2024 1553 by Jannie Julian, RN Outcome: Progressing Goal: Ability to verbalize understanding of medication therapies will improve 11/12/2024 1554 by Jannie Julian, RN Outcome: Progressing 11/12/2024 1553 by Jannie Julian, RN Outcome: Progressing Goal: Individualized Educational Video(s) 11/12/2024 1554 by Jannie Julian, RN Outcome: Progressing 11/12/2024 1553 by Jannie Julian, RN Outcome: Progressing   Problem: Activity: Goal: Capacity to carry out activities will improve 11/12/2024 1554 by Jannie Julian, RN Outcome: Progressing 11/12/2024 1553 by Jannie Julian, RN Outcome: Progressing   Problem: Cardiac: Goal: Ability to achieve and maintain adequate cardiopulmonary perfusion will improve 11/12/2024 1554 by Jannie Julian, RN Outcome: Progressing 11/12/2024 1553 by Jannie Julian, RN Outcome: Progressing   "

## 2024-11-12 NOTE — TOC Transition Note (Signed)
 Transition of Care [facility]) - Discharge Note   Patient Details  Name: Robert Bates MRN: [id number] Date of Birth: February 18, 1941  Transition of Care [facility]) CM/SW Contact:  Luise JAYSON Pan, LCSWA Phone Number: 11/12/2024, 4:02 PM   Clinical Narrative:   Patient will DC to: [place] [facility] SNF Anticipated DC date: 11/12/2024  Family notified: Left VM for Camellia (son) Transport by: [place]   Per MD patient ready for DC to [facility]. RN to call report prior to discharge [phone]). RN, patient, patient's family, and facility notified of DC. Discharge Summary and FL2 sent to facility. DC packet on chart. Ambulance transport requested for patient 4:01PM.   CSW will sign off for now as social work intervention is no longer needed. Please consult us  again if new needs arise.      Final next level of care: Skilled Nursing Facility Barriers to Discharge: Barriers Resolved   Patient Goals and CMS Choice Patient states their goals for this hospitalization and ongoing recovery are:: To get better          Discharge Placement PASRR number recieved: 11/09/24            Patient chooses bed at: [facility] Patient to be transferred to facility by: [facility] Name of family member notified: CSW notified patient, left vm for son Elvin [phone]) Patient and family notified of of transfer: 11/12/24  Discharge Plan and Services Additional resources added to the After Visit Summary for   In-house Referral: Clinical Social Work   Post Acute Care Choice:  (TBD)                               Social Drivers of Health (SDOH) Interventions SDOH Screenings   Food Insecurity: No Food Insecurity (11/08/2024)  Housing: Low Risk (11/08/2024)  Recent Concern: Housing - High Risk (09/28/2024)  Transportation Needs: No Transportation Needs (11/08/2024)  Utilities: Not At Risk (11/08/2024)  Alcohol Screen: Low Risk (10/02/2022)  Depression (PHQ2-9): Medium Risk (09/28/2024)  Financial  Resource Strain: Low Risk (10/19/2024)   Received from [facility]  Physical Activity: Inactive (09/28/2024)  Social Connections: Socially Isolated (11/08/2024)  Stress: Stress Concern Present (09/28/2024)  Tobacco Use: Medium Risk (11/05/2024)     Readmission Risk Interventions    08/06/2023    2:06 PM  Readmission Risk Prevention Plan  Transportation Screening Complete  HRI or Home Care Consult Complete  Social Work Consult for Recovery Care Planning/Counseling Complete  Palliative Care Screening Complete  Medication Review Oceanographer) Referral to Pharmacy

## 2024-11-12 NOTE — TOC Progression Note (Addendum)
 Transition of Care [facility]) - Progression Note    Patient Details  Name: Robert Bates MRN: [id number] Date of Birth: 1941/03/01  Transition of Care [facility]) CM/SW Contact  Luise JAYSON Pan, [place] Phone Number: 11/12/2024, 8:17 AM  Clinical Narrative:   Insurance auth still pending.  3:17 PM auth id [id number]; approval dates 11/12/2024-11/16/2024. CSW followed up with facility about bed availability.    Expected Discharge Plan: Skilled Nursing Facility Barriers to Discharge: Continued Medical Work up, SNF Pending bed offer, English As A Second Language Teacher               Expected Discharge Plan and Services In-house Referral: Clinical Social Work   Post Acute Care Choice:  (TBD) Living arrangements for the past 2 months: Homeless                                       Social Drivers of Health (SDOH) Interventions SDOH Screenings   Food Insecurity: No Food Insecurity (11/08/2024)  Housing: Low Risk (11/08/2024)  Recent Concern: Housing - High Risk (09/28/2024)  Transportation Needs: No Transportation Needs (11/08/2024)  Utilities: Not At Risk (11/08/2024)  Alcohol Screen: Low Risk (10/02/2022)  Depression (PHQ2-9): Medium Risk (09/28/2024)  Financial Resource Strain: Low Risk (10/19/2024)   Received from [facility]  Physical Activity: Inactive (09/28/2024)  Social Connections: Socially Isolated (11/08/2024)  Stress: Stress Concern Present (09/28/2024)  Tobacco Use: Medium Risk (11/05/2024)    Readmission Risk Interventions    08/06/2023    2:06 PM  Readmission Risk Prevention Plan  Transportation Screening Complete  HRI or Home Care Consult Complete  Social Work Consult for Recovery Care Planning/Counseling Complete  Palliative Care Screening Complete  Medication Review Oceanographer) Referral to Pharmacy

## 2024-11-12 NOTE — Assessment & Plan Note (Signed)
 Follow up as outpatient

## 2024-11-12 NOTE — Progress Notes (Signed)
 Physical Therapy Treatment Patient Details Name: Robert Bates MRN: [id number] DOB: 05/05/1941 Today's Date: 11/12/2024   History of Present Illness Pt is a 84 y.o. male who presented 11/05/24 due to increase in SOB. CT chest on 1/2 showed airspace disease throughout the left upper lobe concerning for pneumonia. 11/08/24 code stroke was called and MRI was negative. Of note, pt had a recent admission on 12/15-1/1 and was dc to a motel. PMH: hx of PAF, tobacco use with COPD, lung CA, peripheral vascular disease, HLD, chronic diastolic CHF, DM2, CABGx3, PE    PT Comments  Pt tolerated session well, participating in LE strengthening and balance training. Pt participated in 4 stage balance test, displaying increased difficulty standing on one leg, and with tandem stance, unable to hold either position, demonstrating increased risk of falls. Pt would benefit from further balance training to improve stability and reduce risk of falls. PT will continue to treat pt while he is admitted. Patient will benefit from continued inpatient follow up therapy, <3 hours/day.    If plan is discharge home, recommend the following: A little help with walking and/or transfers;A little help with bathing/dressing/bathroom;Assistance with cooking/housework;Assist for transportation   Can travel by private vehicle     Yes  Equipment Recommendations  Rolling walker (2 wheels)    Recommendations for Other Services       Precautions / Restrictions Precautions Precautions: Fall Recall of Precautions/Restrictions: Intact Precaution/Restrictions Comments: pt has had multiple falls in past year Restrictions Weight Bearing Restrictions Per Provider Order: No     Mobility  Bed Mobility Overal bed mobility: Modified Independent             General bed mobility comments: increase in time    Transfers Overall transfer level: Needs assistance Equipment used: Rolling walker (2 wheels) Transfers: Sit to/from Stand Sit  to Stand: Contact guard assist           General transfer comment: Pt completed 5 STS from EOB, using BUE to push up from surface. VC given for sequencing; increased time to complete.    Ambulation/Gait Ambulation/Gait assistance: Contact guard assist Gait Distance (Feet): 20 Feet Assistive device: Rolling walker (2 wheels) Gait Pattern/deviations: Step-through pattern, Decreased stride length, Trunk flexed Gait velocity: reduced Gait velocity interpretation: <1.8 ft/sec, indicate of risk for recurrent falls   General Gait Details: Pt ambulated 2' forwards/backwards x5 trials, and then completed side steps 2'x5 trials, with RW and no physical assistance. Pt requires cueing for walker management as he tries to pick it up or bumps into it when stepping.   Stairs             Wheelchair Mobility     Tilt Bed    Modified Rankin (Stroke Patients Only)       Balance Overall balance assessment: Needs assistance Sitting-balance support: No upper extremity supported, Feet supported Sitting balance-Leahy Scale: Good Sitting balance - Comments: seated EOB   Standing balance support: No upper extremity supported, During functional activity Standing balance-Leahy Scale: Poor Standing balance comment: Pt completed 4 stage balance test, displaying increased difficulty with balancing on one leg, and tandem both eyes closed and eyes open. Pt displaying significant lateral sway L and R, and had one posterior LOB requiring physical assistance for maintaing upright. Single Leg Stance - Right Leg: 0 (sec) Single Leg Stance - Left Leg: 0 (sec) Tandem Stance - Right Leg: 1 (sec) Tandem Stance - Left Leg: 2 (sec) Rhomberg - Eyes Opened: 10 (sec) Rhomberg - Eyes  Closed: 10 (sec)                Communication Communication Communication: No apparent difficulties  Cognition Arousal: Alert Behavior During Therapy: WFL for tasks assessed/performed   PT - Cognitive impairments:  Sequencing, Problem solving, Safety/Judgement                         Following commands: Intact      Cueing Cueing Techniques: Verbal cues, Gestural cues  Exercises General Exercises - Lower Extremity Hip Flexion/Marching: Strengthening, Both, 10 reps, Standing (with BUE supported on walker)    General Comments General comments (skin integrity, edema, etc.): VSS on RA      Pertinent Vitals/Pain Pain Assessment Pain Assessment: No/denies pain    Home Living                          Prior Function            PT Goals (current goals can now be found in the care plan section) Acute Rehab PT Goals Patient Stated Goal: feel better, stay safe Progress towards PT goals: Progressing toward goals    Frequency    Min 2X/week      PT Plan      Co-evaluation              AM-PAC PT 6 Clicks Mobility   Outcome Measure  Help needed turning from your back to your side while in a flat bed without using bedrails?: None Help needed moving from lying on your back to sitting on the side of a flat bed without using bedrails?: None Help needed moving to and from a bed to a chair (including a wheelchair)?: A Little Help needed standing up from a chair using your arms (e.g., wheelchair or bedside chair)?: A Little Help needed to walk in hospital room?: A Little Help needed climbing 3-5 steps with a railing? : A Lot 6 Click Score: 19    End of Session Equipment Utilized During Treatment: Gait belt Activity Tolerance: Patient tolerated treatment well Patient left: in bed;with call bell/phone within reach;with bed alarm set Nurse Communication: Mobility status PT Visit Diagnosis: Muscle weakness (generalized) (M62.81);Difficulty in walking, not elsewhere classified (R26.2);Pain;Unsteadiness on feet (R26.81)     Time: 8948-8883 PT Time Calculation (min) (ACUTE ONLY): 25 min  Charges:    $Therapeutic Activity: 23-37 mins PT General Charges $$ ACUTE  PT VISIT: 1 Visit                     Leontine Hilt DPT Acute Rehab Services [phone] Prefer contact via chat    Leontine NOVAK Farrell Broerman 11/12/2024, 12:18 PM

## 2024-11-12 NOTE — Discharge Summary (Signed)
 " Physician Discharge Summary   Patient: Robert Bates MRN: [id number] DOB: [birth year]  Admit date:     11/05/2024  Discharge date: 11/12/2024  Discharge Physician: Elidia Sieving Beaumont Austad   PCP: Daryl Setter, NP   Recommendations at discharge:    Patient will continue guideline directed medical therapy with spironolactone , SGLT 2 inh and lisinopril .  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 [facility] Course: Robert Bates was admitted to the hospital with the working diagnosis of heart failure exacerbation   83/M with history heart failure, COPD, non-small cell lung cancer SP XRT,  CAD/CABG, type 2 diabetes mellitus, seizure disorder, neuropathy, anxiety, recurrent pleural effusion, BPH, prior chronic pain, recent long hospitalization at [facility] from 12/15-1/1 for CHF exacerbation A-fib RVR, pleural effusion and concern for PE on VQ scan, treated with diuretics, thoracentesis and started on Eliquis  prior to discharge, cytology was negative for malignant cells then.  Could not get approval for SNF eventually went to a motel, reported to be currently homeless. As outpatient he developed worsening dyspnea and edema, prompting him to come back to the ED.  On his initial physical examination his blood pressure was 109/66, HR 108, RR 18 and 02 saturation 99%  Lungs with no wheezing or  rhonchi, heart with S1 and S2 present and irregular, abdomen with no distention and positive lower extremity edema.   Na 13, K 4.3 Cl 98 bicarbonate 25 glucose 211 bun 30 cr 0,85  Mg 2.0 AST 19 ALT 20  BNP 981 High sensitive troponin 27 and 25  Wbc 17,4 hgb 11.0 plt 496  Urine analysis SG 1,015, negative leukocytes, trace hgb   Chest radiograph with hyperinflation and left rotation, loss lung volume on the left with dense infiltrate left upper lobe with left pleural effusion.   EKG 130 bpm, left axis, left anterior fascicular block, qtc 475, atrial fibrillation with no significant ST segment or T wave changes.   Patient placed on IV diuretics and antibiotics - 1/5 early a.m. noted to have difficulty moving his tongue, pain and slurred speech,code stroke was initiated, CT head -no acute findings, MRI also negative for acute findings, meningiomas noted.  1/8 volume status has improved. Completed antibiotic therapy with good toleration.  01/09 patient very weak and deconditioned, will be transfer to SNF for physical therapy.   Assessment and Plan: Acute on chronic diastolic CHF (congestive heart failure) (HCC) Echocardiogram with preserved LV systolic function with EF 60 to 65%, mild LVH, grade II diastolic dysfunction, RV systolic function preserved, LA with mild dilatation.   Patient was placed on IV furosemide  for diuresis, negative fluid balance was achieved, -6,333 ml with significant improvement in his symptoms.   Continue spironolactone  and SGLT 2 inh  Loop diuretic with furosemide    Acute hypoxemic respiratory failure due to acute cardiogenic pulmonary edema with bilateral  pleural effusions.  01/06 follow up chest radiograph with hyperinflation, cardiomegaly, small left pleural effusion, sternotomy wires in place.  02 saturation 94% on room air  Patient has chronic changes left upper lobe, ruled out bacterial pneumonia and will discontinue antibiotic therapy   Coronary artery  disease No acute coronary syndrome   Primary hypertension Continue blood pressure monitoring   Paroxysmal atrial fibrillation with RVR (HCC) Continue rate control with diltiazem  and amiodarone   Continue anticoagulation with apixaban   Diabetes type 2, controlled (HCC) Patient was placed on insulin  sliding scale for  glucose cover and monitoring during his hospitalization.  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  Continue antiacid therapy   Seizure disorder [facility]) Patient not longer on keppra    Hyponatremia At the time of discharge his renal function is stable with serum cr at 0,87 with K at 4,0 and serum bicarbonate at 26  Na 137 P 2,8   Plan to follow up renal function and electrolytes as outpatient.  Continue furosemide , spironolactone  and SGLT 2 inh   Abdominal aortic aneurysm Follow up as outpatient        Consultants: none  Procedures performed: none   Disposition: SNF Diet recommendation:  Cardiac diet DISCHARGE MEDICATION: Allergies as of 11/12/2024       Reactions   Iodine Swelling   Neck, gland swelling   Iohexol  Swelling   Neck, gland swelling   Iodinated Contrast Media    Metformin  And Related Diarrhea   Metformin  Nausea Only        Medication List     STOP taking these medications    diclofenac  Sodium 1 % Gel Commonly known as: VOLTAREN    potassium chloride  10 MEQ tablet Commonly known as: KLOR-CON        TAKE these medications    acetaminophen  500 MG tablet Commonly known as: TYLENOL  Take 1-2 tablets (500-1,000 mg total) by mouth every 6 (six) hours as needed.   amiodarone  200 MG tablet Commonly known as: PACERONE  Take 1 tablet (200 mg total) by mouth daily.   apixaban  5 MG  Tabs tablet Commonly known as: ELIQUIS  Take 1 tablet (5 mg total) by mouth 2 (two) times daily.   atorvastatin  80 MG tablet Commonly known as: LIPITOR  Take 1 tablet (80 mg total) by mouth daily.   Breztri  Aerosphere 160-9-4.8 MCG/ACT Aero inhaler Generic drug: budesonide -glycopyrrolate -formoterol  Inhale 2 puffs into the lungs 2 (two) times daily.   dapagliflozin  propanediol 5 MG Tabs tablet Commonly known as: Farxiga  Take 1 tablet (5 mg total) by mouth daily.   diltiazem  240 MG 24 hr capsule Commonly known as: CARDIZEM  CD Take 1 capsule (240 mg total) by mouth daily. Start taking on: November 13, 2024   furosemide  20 MG tablet Commonly known as: LASIX  Take 20 mg by mouth daily.   gabapentin  100 MG capsule Commonly known as: NEURONTIN  Take 1 capsule (100 mg total) by mouth 3 (three) times daily.   levalbuterol  0.63 MG/3ML nebulizer solution Commonly known as: XOPENEX  Take 3 mLs (0.63 mg total) by nebulization every 4 (four) hours as needed for wheezing or shortness of breath.   lisinopril  2.5 MG tablet Commonly known as: Zestril  Take 1 tablet (2.5 mg total) by mouth daily.   omeprazole  40 MG capsule Commonly known  as: PRILOSEC Take 1 capsule (40 mg total) by mouth daily.   ondansetron  4 MG tablet Commonly known as: Zofran  Take 1 tablet (4 mg total) by mouth every 8 (eight) hours as needed for nausea or vomiting.   polyethylene glycol 17 g packet Commonly known as: MIRALAX  / GLYCOLAX  Take 17 g by mouth 2 (two) times daily.   sertraline  50 MG tablet Commonly known as: ZOLOFT  Take 1 tablet (50 mg total) by mouth daily.   spironolactone  25 MG tablet Commonly known as: ALDACTONE  Take 1 tablet (25 mg total) by mouth daily.   tamsulosin  0.4 MG Caps capsule Commonly known as: FLOMAX  Take 1 capsule (0.4 mg total) by mouth at bedtime.   VITAMIN B-12 CR PO Take 1 tablet by mouth daily.        Contact information for after-discharge care     Destination      [facility] .   Service: Skilled Nursing Contact information: [address] [place]  [phone]                    Discharge Exam: Robert Bates   11/10/24 0300 11/11/24 0417 11/12/24 0300  Weight: 70.9 kg 70.7 kg 70.4 kg   BP 116/73 (BP Location: Right Arm)   Pulse 78   Temp 98.1 F (36.7 C) (Oral)   Resp 18   Ht 5' 7 (1.702 m)   Wt 70.4 kg   SpO2 98%   BMI 24.31 kg/m   Patient with no chest pain, no dyspnea, PND, orthopnea or lower extremity edema  Neurology awake and alert ENT with mild pallor with no icterus Cardiovascular with S1 and S2 present and regular with no gallops or rubs, positive systolic murmur at the left lower sternal border Respiratory with no rales or wheezing, no rhonchi Abdomen with no distention  Trace lower extremity edema   Condition at discharge: stable  The results of significant diagnostics from this hospitalization (including imaging, microbiology, ancillary and laboratory) are listed below for reference.   Imaging Studies: DG CHEST PORT 1 VIEW Result Date: 11/09/2024 CLINICAL DATA:  Pleural effusion. EXAM: PORTABLE CHEST 1 VIEW COMPARISON:  11/08/2024 FINDINGS: Again noted is volume loss in left hemithorax. Patchy densities in left upper lung are again noted. Slightly improved aeration at the left hilum. Aeration at the left lung base has slightly improved. Suspect persistent volume loss or pleural fluid at the left lung base. Postsurgical changes in the chest. Prominent interstitial markings in the right lung are unchanged. Negative for pneumothorax. IMPRESSION: 1. Persistent volume loss in left hemithorax. 2. Slightly improved aeration in left lung. 3. Persistent densities at the left lung base could represent atelectasis and pleural fluid. Electronically Signed   By: Juliene Balder M.D.   On: 11/09/2024 15:24   DG CHEST PORT 1 VIEW Result Date: 11/08/2024 CLINICAL DATA:  Follow-up left pleural  effusion. EXAM: PORTABLE CHEST 1 VIEW COMPARISON:  11/05/2024 and chest CT dated 11/05/2024. FINDINGS: Stable normal-sized heart and left atrial clip. Mild increase in size of a small to moderate-sized left pleural effusion with increased consolidation in the left lower lobe and increased patchy and consolidative density in the left mid lung zone. Stable chronic interstitial prominence on the right. Stable post CABG changes. Mild-to-moderate right shoulder degenerative changes. IMPRESSION: Mild increase in size of a small to moderate-sized left pleural effusion with increased consolidation in the left lower lobe and increased patchy and consolidative density in the left mid lung  zone, most likely representing a combination of pneumonia and atelectasis. Electronically Signed   By: Elspeth Bathe M.D.   On: 11/08/2024 17:21   MR BRAIN WO CONTRAST Result Date: 11/08/2024 EXAM: MRI Brain Without Contrast 11/08/2024 02:58:55 AM TECHNIQUE: Multiplanar multisequence MRI of the head/brain was performed without the administration of intravenous contrast. COMPARISON: CT head from earlier today. CLINICAL HISTORY: Neuro deficit, acute, stroke suspected FINDINGS: BRAIN AND VENTRICLES: No acute infarct. No intracranial hemorrhage. Unchanged to approximately 2 cm anterior right temporal convexity, 1.7 cm planum sphenoidale, and 3.4 cm high right frontal convexity extra-axial dural-based masses . Similar surrounding edema, greatest in the right temporal lobe and extending into the posterior white matter tracks of right basal ganglia. The absence of contrast limits assessment. No midline shift. No hydrocephalus. The sella is unremarkable. Normal flow voids. ORBITS: No acute abnormality. SINUSES AND MASTOIDS: Small amount of fluid in right maxillary sinus. BONES AND SOFT TISSUES: Normal marrow signal. No acute soft tissue abnormality. IMPRESSION: 1. No acute intracranial abnormality. 2. Unchanged right temporal, planum sphenoidale,  and high parasagittal frontal convexity meningiomas and surrounding edema as detailed above. Electronically signed by: Gilmore Molt 11/08/2024 03:36 AM EST RP Workstation: [id number]   CT HEAD WO CONTRAST ( ) Result Date: 11/08/2024 EXAM: CT HEAD WITHOUT 11/08/2024 02:30:53 AM TECHNIQUE: CT of the head was performed without the administration of intravenous contrast. Automated exposure control, iterative reconstruction, and/or weight based adjustment of the mA/kV was utilized to reduce the radiation dose to as low as reasonably achievable. COMPARISON: CT head. Comparison from 03/23/2024. CLINICAL HISTORY: Stroke, hemorrhagic FINDINGS: BRAIN AND VENTRICLES: No acute intracranial hemorrhage. Redemonstrated right frontal and right temporal convexity masses, compatible with meningiomas. No midline shift. Please see forthcoming MRI for more sensitive evaluation for interval change. No extra-axial fluid collection. No evidence of acute infarct. No hydrocephalus. ORBITS: No acute abnormality. SINUSES AND MASTOIDS: Right maxillary sinus air-fluid level and secretions. Otherwise, clear sinuses. No mastoid effusions. SOFT TISSUES AND SKULL: No acute skull fracture. No acute soft tissue abnormality. IMPRESSION: 1. No acute intracranial abnormality. 2. Redemonstrated right frontal and right temporal convexity masses, compatible with meningiomas. Please see forthcoming MRI for more sensitive evaluation for interval change in size. Electronically signed by: Gilmore Molt 11/08/2024 03:17 AM EST RP Workstation: [id number]   CT Chest Wo Contrast Result Date: 11/05/2024 EXAM: CT CHEST WITHOUT CONTRAST 11/05/2024 06:44:00 PM TECHNIQUE: CT of the chest was performed without the administration of intravenous contrast. Multiplanar reformatted images are provided for review. Automated exposure control, iterative reconstruction, and/or weight based adjustment of the mA/kV was utilized to reduce the radiation dose to as low as  reasonably achievable. COMPARISON: Chest x-ray today. Chest CT 03/2024. CLINICAL HISTORY: Cough, chronic/persisting > 8 weeks, failed empiric treatment. FINDINGS: MEDIASTINUM: Heart and pericardium are unremarkable. The central airways are clear. Diffuse aortic atherosclerosis. Large hiatal hernia. LYMPH NODES: No mediastinal, hilar or axillary lymphadenopathy. LUNGS AND PLEURA: Mild emphysema. Moderate left pleural effusion, increasing since prior CT. Soft tissue contiguous with the left hilum and extending throughout the left lung to the left lateral pleural surface. This is unchanged since the prior study and may reflect scarring. Airspace disease throughout the left upper lobe concerning for pneumonia. Nodular airspace disease in the lingula and left lower lobe which could be infectious/inflammatory, although neoplasm cannot be excluded. Scattered clustered nodular densities in the right upper lobe. No pneumothorax. SOFT TISSUES/BONES: No acute abnormality of the bones or soft tissues. UPPER ABDOMEN: Limited images of the upper abdomen demonstrates  no acute abnormality. IMPRESSION: 1. Airspace disease throughout the left upper lobe concerning for pneumonia. 2. Nodular airspace disease in the lingula and left lower lobe, possibly infectious/inflammatory, although neoplasm cannot be excluded; follow-up chest CT after treatment is recommended to document resolution. 3. Moderate left pleural effusion, increasing since prior CT. 4. Small scattered clustered ground-glass nodules in the right upper lobe. Recommended attention on follow-up imaging. 5. Soft tissue extends from the left hilum throughout the left upper lobe to the lateral left pleural surface and is stable since the prior study, favor scarring. 6. Emphysema. Electronically signed by: Franky Crease MD 11/05/2024 07:28 PM EST RP Workstation: [id number]   DG Chest Portable 1 View Result Date: 11/05/2024 EXAM: 1 VIEW(S) XRAY OF THE CHEST 11/05/2024 05:46:00 PM  COMPARISON: 07/30/2024 CLINICAL HISTORY: CP FINDINGS: LUNGS AND PLEURA: Interval worsening of airspace opacity in the left upper lobe. Interval increased at least small volume left pleural effusion. No pneumothorax. HEART AND MEDIASTINUM: Aortic calcification. Left atrial clip noted. BONES AND SOFT TISSUES: Sternotomy and cardiac surgical changes noted. IMPRESSION: 1. Interval worsening of airspace opacity in the left upper lobe. 2. Interval increased at least small volume left pleural effusion. Electronically signed by: Morgane Naveau MD 11/05/2024 06:40 PM EST RP Workstation: [id number]    Microbiology: Results for orders placed or performed during the hospital encounter of 11/05/24  Blood culture (routine x 2)     Status: None   Collection Time: 11/05/24  8:00 PM   Specimen: BLOOD  Result Value Ref Range Status   Specimen Description   Final    BLOOD LEFT ANTECUBITAL Performed at [facility], [address] [place], [place] [address]    Special Requests   Final    BOTTLES DRAWN AEROBIC AND ANAEROBIC Blood Culture adequate volume Performed at [facility], [address] [place], [place] [address]    Culture   Final    NO GROWTH 5 DAYS Performed at [facility], [address] [place], [place] [address]    Report Status 11/11/2024 FINAL  Final  Blood culture (routine x 2)     Status: None   Collection Time: 11/05/24  8:10 PM   Specimen: BLOOD  Result Value Ref Range Status   Specimen Description   Final    BLOOD RIGHT ANTECUBITAL Performed at [facility], [address] [place], [place] [address]    Special Requests   Final    BOTTLES DRAWN AEROBIC AND ANAEROBIC Blood Culture adequate volume Performed at [facility], [address] [place], [place] [address]    Culture   Final    NO GROWTH 5 DAYS Performed at [facility], [address] [place], [place] [address]    Report Status 11/11/2024 FINAL  Final    *Note: Due to a large number of results and/or encounters for the requested time period, some results have not been displayed. A complete set of results can be found in Results Review.    Labs: CBC: Recent Labs  Lab 11/05/24 1702 11/06/24 1455 11/07/24 0158 11/08/24 0847 11/09/24 0308 11/10/24 0625 11/11/24 0317  WBC 17.4*   < > 12.3* 10.6* 11.1* 11.0* 9.8  NEUTROABS 14.4*  --   --   --   --   --   --   HGB 11.0*   < > 8.8* 8.9* 8.8* 8.6* 8.5*  HCT 36.0*   < > 28.5* 29.0* 28.2* 27.4*  27.7*  MCV 83.1   < > 82.6 83.8 81.7 82.3 83.4  PLT 496*   < > 349 322 327 292 272   < > = values in this interval not displayed.   Basic Metabolic Panel: Recent Labs  Lab 11/05/24 1716 11/06/24 1455 11/08/24 0847 11/09/24 0945 11/10/24 0625 11/11/24 0317 11/12/24 0254  NA  --    < > 135 135 137 133* 137  K  --    < > 3.6 3.8 4.1 4.2 4.0  CL  --    < > 99 96* 100 98 102  CO2  --    < > 28 25 29 27 26   GLUCOSE  --    < > 115* 213* 109* 132* 136*  BUN  --    < > 19 31* 27* 25* 17  CREATININE  --    < > 0.67 1.13 0.81 1.06 0.87  CALCIUM   --    < > 8.1* 8.7* 8.4* 8.6* 8.5*  MG 2.0  --   --   --   --  2.1  --   PHOS  --   --   --   --   --   --  2.8   < > = values in this interval not displayed.   Liver Function Tests: Recent Labs  Lab 11/05/24 1702 11/06/24 1455 11/07/24 0158 11/08/24 0847 11/12/24 0254  AST 19 16 12* 12*  --   ALT 20 20 16 15   --   ALKPHOS 92 86 71 70  --   BILITOT 0.3 0.4 0.4 0.5  --   PROT 5.8* 5.4* 4.6* 5.0*  --   ALBUMIN  3.3* 2.9* 2.6* 2.6* 3.0*   CBG: Recent Labs  Lab 11/11/24 1107 11/11/24 1536 11/11/24 2157 11/12/24 0606 11/12/24 1128  GLUCAP 219* 139* 217* 117* 158*    Discharge time spent: greater than 30 minutes.  Signed: Elidia Toribio Furnace, MD [facility] 11/12/2024 "

## 2024-11-14 ENCOUNTER — Telehealth: Payer: Self-pay | Admitting: Family

## 2024-11-14 NOTE — Telephone Encounter (Signed)
 Patient discharged from hospital over the weekend. Please schedule him a follow up visit with me in the next 2 weeks.  We can use a block if needed.

## 2024-11-15 ENCOUNTER — Other Ambulatory Visit: Payer: Self-pay | Admitting: Licensed Clinical Social Worker

## 2024-11-15 NOTE — Patient Outreach (Signed)
 Social Drivers of Health  Community Resource and Care Coordination Visit Note   11/15/2024  Name: Robert Bates MRN: [id number] DOB:1941/02/09  Situation: Referral received for [facility] needs assessment and assistance related to Housing . I obtained verbal consent from Patient.  Visit completed with Patient on the phone.   Background:      Assessment:   Goals Addressed             This Visit's Progress    COMPLETED: BSW VBCI Social Work Care Plan       Problems:   Housing   CSW Clinical Goal(s):   Over the next 2 weeks the Patient will will follow up with resources on housing that will be mailed  as directed by Social Work.  Interventions:  SW will mail resources for [name] and [facility] for housing   Patient Goals/Self-Care Activities:  Follow up on housing resources and patient has applied for housing for [place] and just applied about 1 month ago and is waiting for approval .  Plan:   Telephone follow up appointment with care management team member scheduled for:  11/01/2024 at 11:00 am        Recommendation:   attend all scheduled provider appointments Patient had a current hospital; encounter and was at [facility] an now he is at [facility] and will be there for 20 days. He stated that his son is in the process of finding him a place in [facility] or here. SW will close out the case today and informed the LCSW because a program was still attached to the case.   Follow Up Plan:   Patient has achieved all patient stated goals. [employer] will be closed. Patient has been provided contact information should new needs arise.   Tobias [name] [name], PhD [facility], [facility] Social Worker Direct Dial: [phone]  Fax: [phone]

## 2024-11-15 NOTE — Patient Instructions (Signed)

## 2024-11-17 ENCOUNTER — Telehealth: Payer: Self-pay | Admitting: Pharmacist

## 2024-11-17 ENCOUNTER — Other Ambulatory Visit

## 2024-11-17 NOTE — Telephone Encounter (Signed)
 Patient was scheduled with Clinical Pharmacist Practitioner today to review his medications but from his chart notes it looks like he is currently in a rehab facility. I tried to call him to reschedule but was unable to reach him or LM on VM.  Will try to reach out after he is discharged to review medications.

## 2024-11-19 ENCOUNTER — Other Ambulatory Visit (HOSPITAL_BASED_OUTPATIENT_CLINIC_OR_DEPARTMENT_OTHER): Payer: Self-pay

## 2024-11-22 NOTE — Patient Outreach (Signed)
 Patient is enrolled in [facility] transitions of care therefore was dis-enrolled in [facility] services.  Olam Ally, MSW, LCSW [facility], [facility] Licensed Clinical Social Worker Direct Dial: [phone]

## 2024-11-22 NOTE — Telephone Encounter (Signed)
Called patient a few times but no answer 

## 2024-11-22 NOTE — Telephone Encounter (Unsigned)
 Copied from CRM [id number]. Topic: Medical Record Request - Attorney/Litigation >> Nov 22, 2024 10:01 AM Robert Bates wrote: Reason for CRM: Robert Bates patient power of attorney is calling to get records of power of attorney for patient she would like for this information to be sent to her. Please contact [phone]  Route to Research Officer, Political [employer].

## 2024-11-22 NOTE — Telephone Encounter (Signed)
 Copied from CRM #[id number]. Topic: General - Other >> Nov 22, 2024  8:15 AM Aleatha BROCKS wrote: Reason for CRM: Patient is call to let Nurse Eleanor that his niece Erminio is trying to get power of authority and patient doesn't want her to please give him a call to further discuss please call this number [phone] his phone is currently not working due [employer]

## 2024-11-24 ENCOUNTER — Other Ambulatory Visit (HOSPITAL_BASED_OUTPATIENT_CLINIC_OR_DEPARTMENT_OTHER): Payer: Self-pay

## 2024-11-24 NOTE — Telephone Encounter (Unsigned)
 Copied from CRM #[id number]. Topic: General - Other >> Nov 24, 2024 10:21 AM Lonell PEDLAR wrote: Reason for CRM: Patient's niece, Erminio, called stating that we have a copy of patient's healthcare POA. They have misplaced their copy and requesting that we send it to them as soon as possible. C/b: [phone]

## 2024-11-24 NOTE — Telephone Encounter (Signed)
 Patient wanted to let us  know he is not agreeing to having a poa, in case we get a call about this

## 2024-11-26 ENCOUNTER — Telehealth: Payer: Self-pay | Admitting: Family

## 2024-11-26 NOTE — Telephone Encounter (Signed)
 Please advise patient that I received notification from [place] and [place] that he is no longer qualifying for xarelto  patient assistance. If he is unable to afford this medication please let me know and we will need to switch him to coumadin.

## 2024-12-06 ENCOUNTER — Other Ambulatory Visit: Admitting: Pharmacist

## 2025-02-28 ENCOUNTER — Other Ambulatory Visit

## 2025-03-07 ENCOUNTER — Ambulatory Visit: Admitting: Internal Medicine

## 2025-10-05 ENCOUNTER — Ambulatory Visit
